# Patient Record
Sex: Male | Born: 1950 | Race: White | Hispanic: No | Marital: Married | Smoking: Never smoker
Health system: Southern US, Community
[De-identification: ages and names within clinical notes are randomized; demographics above are authoritative.]

## PROBLEM LIST (undated history)

## (undated) DIAGNOSIS — T4145XA Adverse effect of unspecified anesthetic, initial encounter: Secondary | ICD-10-CM

## (undated) DIAGNOSIS — H9190 Unspecified hearing loss, unspecified ear: Secondary | ICD-10-CM

## (undated) DIAGNOSIS — D126 Benign neoplasm of colon, unspecified: Secondary | ICD-10-CM

## (undated) DIAGNOSIS — M1990 Unspecified osteoarthritis, unspecified site: Secondary | ICD-10-CM

## (undated) DIAGNOSIS — G709 Myoneural disorder, unspecified: Secondary | ICD-10-CM

## (undated) DIAGNOSIS — I499 Cardiac arrhythmia, unspecified: Secondary | ICD-10-CM

## (undated) DIAGNOSIS — G2581 Restless legs syndrome: Secondary | ICD-10-CM

## (undated) DIAGNOSIS — N390 Urinary tract infection, site not specified: Secondary | ICD-10-CM

## (undated) DIAGNOSIS — E119 Type 2 diabetes mellitus without complications: Secondary | ICD-10-CM

## (undated) DIAGNOSIS — H269 Unspecified cataract: Secondary | ICD-10-CM

## (undated) DIAGNOSIS — G20A1 Parkinson's disease without dyskinesia, without mention of fluctuations: Secondary | ICD-10-CM

## (undated) DIAGNOSIS — G4733 Obstructive sleep apnea (adult) (pediatric): Secondary | ICD-10-CM

## (undated) DIAGNOSIS — E039 Hypothyroidism, unspecified: Secondary | ICD-10-CM

## (undated) DIAGNOSIS — K5900 Constipation, unspecified: Secondary | ICD-10-CM

## (undated) DIAGNOSIS — Z9889 Other specified postprocedural states: Secondary | ICD-10-CM

## (undated) DIAGNOSIS — N400 Enlarged prostate without lower urinary tract symptoms: Secondary | ICD-10-CM

## (undated) DIAGNOSIS — G4730 Sleep apnea, unspecified: Secondary | ICD-10-CM

## (undated) DIAGNOSIS — H409 Unspecified glaucoma: Secondary | ICD-10-CM

## (undated) DIAGNOSIS — I2510 Atherosclerotic heart disease of native coronary artery without angina pectoris: Secondary | ICD-10-CM

## (undated) DIAGNOSIS — I219 Acute myocardial infarction, unspecified: Secondary | ICD-10-CM

## (undated) DIAGNOSIS — N200 Calculus of kidney: Secondary | ICD-10-CM

## (undated) DIAGNOSIS — Z9989 Dependence on other enabling machines and devices: Secondary | ICD-10-CM

## (undated) DIAGNOSIS — T8859XA Other complications of anesthesia, initial encounter: Secondary | ICD-10-CM

## (undated) DIAGNOSIS — K439 Ventral hernia without obstruction or gangrene: Secondary | ICD-10-CM

## (undated) DIAGNOSIS — G20 Parkinson's disease: Secondary | ICD-10-CM

## (undated) DIAGNOSIS — I10 Essential (primary) hypertension: Secondary | ICD-10-CM

## (undated) DIAGNOSIS — Z87442 Personal history of urinary calculi: Secondary | ICD-10-CM

## (undated) DIAGNOSIS — E785 Hyperlipidemia, unspecified: Secondary | ICD-10-CM

## (undated) DIAGNOSIS — J45909 Unspecified asthma, uncomplicated: Secondary | ICD-10-CM

## (undated) DIAGNOSIS — E0590 Thyrotoxicosis, unspecified without thyrotoxic crisis or storm: Secondary | ICD-10-CM

## (undated) DIAGNOSIS — R112 Nausea with vomiting, unspecified: Secondary | ICD-10-CM

## (undated) DIAGNOSIS — Z95810 Presence of automatic (implantable) cardiac defibrillator: Secondary | ICD-10-CM

## (undated) DIAGNOSIS — R0602 Shortness of breath: Secondary | ICD-10-CM

## (undated) DIAGNOSIS — K219 Gastro-esophageal reflux disease without esophagitis: Secondary | ICD-10-CM

## (undated) HISTORY — PX: EYE SURGERY: SHX253

## (undated) HISTORY — DX: Essential (primary) hypertension: I10

## (undated) HISTORY — DX: Gastro-esophageal reflux disease without esophagitis: K21.9

## (undated) HISTORY — DX: Parkinson's disease without dyskinesia, without mention of fluctuations: G20.A1

## (undated) HISTORY — DX: Unspecified cataract: H26.9

## (undated) HISTORY — DX: Ventral hernia without obstruction or gangrene: K43.9

## (undated) HISTORY — DX: Benign neoplasm of colon, unspecified: D12.6

## (undated) HISTORY — DX: Parkinson's disease: G20

## (undated) HISTORY — DX: Benign prostatic hyperplasia without lower urinary tract symptoms: N40.0

## (undated) HISTORY — DX: Calculus of kidney: N20.0

## (undated) HISTORY — DX: Unspecified asthma, uncomplicated: J45.909

## (undated) HISTORY — PX: INSERT / REPLACE / REMOVE PACEMAKER: SUR710

## (undated) HISTORY — DX: Sleep apnea, unspecified: G47.30

## (undated) HISTORY — DX: Thyrotoxicosis, unspecified without thyrotoxic crisis or storm: E05.90

## (undated) HISTORY — PX: COLONOSCOPY W/ BIOPSIES AND POLYPECTOMY: SHX1376

## (undated) HISTORY — PX: CATARACT EXTRACTION W/ INTRAOCULAR LENS IMPLANT: SHX1309

## (undated) HISTORY — PX: CARDIAC CATHETERIZATION: SHX172

## (undated) HISTORY — DX: Obstructive sleep apnea (adult) (pediatric): G47.33

## (undated) HISTORY — PX: LITHOTRIPSY: SUR834

## (undated) HISTORY — DX: Atherosclerotic heart disease of native coronary artery without angina pectoris: I25.10

## (undated) HISTORY — DX: Urinary tract infection, site not specified: N39.0

## (undated) HISTORY — DX: Hyperlipidemia, unspecified: E78.5

## (undated) HISTORY — DX: Type 2 diabetes mellitus without complications: E11.9

## (undated) HISTORY — PX: FINGER AMPUTATION: SHX636

## (undated) HISTORY — PX: TONSILLECTOMY: SUR1361

## (undated) HISTORY — DX: Acute myocardial infarction, unspecified: I21.9

---

## 1979-01-10 HISTORY — PX: ACOUSTIC NEUROMA RESECTION: SHX5713

## 1996-01-10 HISTORY — PX: CORONARY STENT PLACEMENT: SHX1402

## 1997-12-03 ENCOUNTER — Other Ambulatory Visit: Admission: RE | Admit: 1997-12-03 | Discharge: 1997-12-03 | Payer: Self-pay | Admitting: Gastroenterology

## 1998-01-09 HISTORY — PX: MEDIAN STERNOTOMY: SUR860

## 1998-01-09 HISTORY — PX: CORONARY ARTERY BYPASS GRAFT: SHX141

## 1998-05-17 ENCOUNTER — Inpatient Hospital Stay (HOSPITAL_COMMUNITY): Admission: EM | Admit: 1998-05-17 | Discharge: 1998-05-24 | Payer: Self-pay | Admitting: Emergency Medicine

## 1998-05-17 ENCOUNTER — Encounter: Payer: Self-pay | Admitting: Emergency Medicine

## 1998-05-22 ENCOUNTER — Encounter: Payer: Self-pay | Admitting: Thoracic Surgery (Cardiothoracic Vascular Surgery)

## 1998-06-22 ENCOUNTER — Encounter (HOSPITAL_COMMUNITY): Admission: RE | Admit: 1998-06-22 | Discharge: 1998-09-20 | Payer: Self-pay | Admitting: Cardiology

## 2000-02-09 ENCOUNTER — Encounter: Payer: Self-pay | Admitting: Internal Medicine

## 2000-02-09 ENCOUNTER — Ambulatory Visit (HOSPITAL_COMMUNITY): Admission: RE | Admit: 2000-02-09 | Discharge: 2000-02-09 | Payer: Self-pay | Admitting: Internal Medicine

## 2000-07-25 ENCOUNTER — Ambulatory Visit (HOSPITAL_BASED_OUTPATIENT_CLINIC_OR_DEPARTMENT_OTHER): Admission: RE | Admit: 2000-07-25 | Discharge: 2000-07-25 | Payer: Self-pay | Admitting: Internal Medicine

## 2000-10-10 ENCOUNTER — Encounter: Admission: RE | Admit: 2000-10-10 | Discharge: 2001-01-08 | Payer: Self-pay | Admitting: Family Medicine

## 2002-01-09 HISTORY — PX: DEEP BRAIN STIMULATOR PLACEMENT: SHX608

## 2002-02-20 ENCOUNTER — Encounter: Payer: Self-pay | Admitting: Internal Medicine

## 2002-02-20 ENCOUNTER — Ambulatory Visit (HOSPITAL_COMMUNITY): Admission: RE | Admit: 2002-02-20 | Discharge: 2002-02-20 | Payer: Self-pay | Admitting: Internal Medicine

## 2002-05-15 ENCOUNTER — Encounter: Payer: Self-pay | Admitting: Internal Medicine

## 2002-05-15 ENCOUNTER — Encounter (HOSPITAL_COMMUNITY): Admission: RE | Admit: 2002-05-15 | Discharge: 2002-08-13 | Payer: Self-pay | Admitting: Internal Medicine

## 2003-02-26 ENCOUNTER — Ambulatory Visit (HOSPITAL_COMMUNITY): Admission: RE | Admit: 2003-02-26 | Discharge: 2003-02-26 | Payer: Self-pay | Admitting: Neurosurgery

## 2003-04-30 ENCOUNTER — Observation Stay (HOSPITAL_COMMUNITY): Admission: RE | Admit: 2003-04-30 | Discharge: 2003-04-30 | Payer: Self-pay | Admitting: Neurosurgery

## 2003-06-30 ENCOUNTER — Inpatient Hospital Stay (HOSPITAL_COMMUNITY): Admission: RE | Admit: 2003-06-30 | Discharge: 2003-07-01 | Payer: Self-pay | Admitting: Neurosurgery

## 2003-07-28 ENCOUNTER — Inpatient Hospital Stay (HOSPITAL_COMMUNITY): Admission: RE | Admit: 2003-07-28 | Discharge: 2003-07-29 | Payer: Self-pay | Admitting: Neurosurgery

## 2003-08-06 ENCOUNTER — Encounter: Admission: RE | Admit: 2003-08-06 | Discharge: 2003-08-06 | Payer: Self-pay | Admitting: Internal Medicine

## 2003-08-10 ENCOUNTER — Ambulatory Visit: Payer: Self-pay | Admitting: Cardiology

## 2003-11-12 ENCOUNTER — Ambulatory Visit: Payer: Self-pay

## 2003-11-16 ENCOUNTER — Ambulatory Visit: Payer: Self-pay | Admitting: Cardiology

## 2003-11-30 ENCOUNTER — Ambulatory Visit: Payer: Self-pay | Admitting: Cardiology

## 2003-12-14 ENCOUNTER — Ambulatory Visit: Payer: Self-pay | Admitting: Cardiology

## 2003-12-17 ENCOUNTER — Ambulatory Visit: Payer: Self-pay | Admitting: Cardiology

## 2003-12-24 ENCOUNTER — Ambulatory Visit: Payer: Self-pay | Admitting: Family Medicine

## 2004-01-07 ENCOUNTER — Encounter: Admission: RE | Admit: 2004-01-07 | Discharge: 2004-01-07 | Payer: Self-pay | Admitting: Internal Medicine

## 2004-01-07 ENCOUNTER — Ambulatory Visit: Payer: Self-pay | Admitting: Internal Medicine

## 2004-01-22 ENCOUNTER — Ambulatory Visit: Payer: Self-pay | Admitting: Internal Medicine

## 2004-02-10 ENCOUNTER — Ambulatory Visit: Payer: Self-pay | Admitting: Endocrinology

## 2004-02-11 ENCOUNTER — Encounter (HOSPITAL_COMMUNITY): Admission: RE | Admit: 2004-02-11 | Discharge: 2004-05-11 | Payer: Self-pay | Admitting: Endocrinology

## 2004-03-09 ENCOUNTER — Ambulatory Visit: Payer: Self-pay | Admitting: Cardiology

## 2004-03-10 ENCOUNTER — Ambulatory Visit: Payer: Self-pay | Admitting: *Deleted

## 2004-03-24 ENCOUNTER — Ambulatory Visit: Payer: Self-pay | Admitting: Endocrinology

## 2004-04-28 ENCOUNTER — Ambulatory Visit: Payer: Self-pay | Admitting: Internal Medicine

## 2004-05-10 ENCOUNTER — Ambulatory Visit: Payer: Self-pay | Admitting: Internal Medicine

## 2004-05-16 ENCOUNTER — Ambulatory Visit: Payer: Self-pay | Admitting: Endocrinology

## 2004-06-23 ENCOUNTER — Ambulatory Visit: Payer: Self-pay | Admitting: Internal Medicine

## 2004-06-23 ENCOUNTER — Ambulatory Visit: Payer: Self-pay | Admitting: Endocrinology

## 2004-07-06 ENCOUNTER — Ambulatory Visit: Payer: Self-pay | Admitting: Internal Medicine

## 2004-07-22 ENCOUNTER — Encounter: Admission: RE | Admit: 2004-07-22 | Discharge: 2004-07-22 | Payer: Self-pay | Admitting: Internal Medicine

## 2004-08-12 ENCOUNTER — Ambulatory Visit: Payer: Self-pay | Admitting: Cardiovascular Disease

## 2004-09-14 ENCOUNTER — Ambulatory Visit: Payer: Self-pay | Admitting: Cardiology

## 2004-09-15 ENCOUNTER — Ambulatory Visit: Payer: Self-pay | Admitting: Internal Medicine

## 2004-11-17 ENCOUNTER — Ambulatory Visit: Payer: Self-pay | Admitting: Internal Medicine

## 2004-12-07 ENCOUNTER — Ambulatory Visit: Payer: Self-pay

## 2004-12-08 ENCOUNTER — Ambulatory Visit: Payer: Self-pay | Admitting: Cardiology

## 2005-02-08 ENCOUNTER — Ambulatory Visit: Payer: Self-pay | Admitting: Cardiology

## 2005-02-10 ENCOUNTER — Ambulatory Visit: Payer: Self-pay | Admitting: Internal Medicine

## 2005-02-17 ENCOUNTER — Ambulatory Visit: Payer: Self-pay | Admitting: Internal Medicine

## 2005-05-31 ENCOUNTER — Emergency Department (HOSPITAL_COMMUNITY): Admission: EM | Admit: 2005-05-31 | Discharge: 2005-05-31 | Payer: Self-pay | Admitting: Family Medicine

## 2005-07-07 ENCOUNTER — Ambulatory Visit: Payer: Self-pay | Admitting: Internal Medicine

## 2005-09-10 ENCOUNTER — Emergency Department (HOSPITAL_COMMUNITY): Admission: EM | Admit: 2005-09-10 | Discharge: 2005-09-10 | Payer: Self-pay | Admitting: Emergency Medicine

## 2005-10-06 ENCOUNTER — Ambulatory Visit: Payer: Self-pay | Admitting: Internal Medicine

## 2005-10-17 ENCOUNTER — Ambulatory Visit: Payer: Self-pay | Admitting: Internal Medicine

## 2005-11-01 ENCOUNTER — Ambulatory Visit: Payer: Self-pay | Admitting: Cardiology

## 2005-11-13 ENCOUNTER — Ambulatory Visit: Payer: Self-pay | Admitting: Internal Medicine

## 2005-12-27 ENCOUNTER — Encounter: Admission: RE | Admit: 2005-12-27 | Discharge: 2006-01-26 | Payer: Self-pay | Admitting: Internal Medicine

## 2006-02-15 ENCOUNTER — Encounter: Admission: RE | Admit: 2006-02-15 | Discharge: 2006-02-15 | Payer: Self-pay | Admitting: Neurology

## 2006-03-01 ENCOUNTER — Encounter: Admission: RE | Admit: 2006-03-01 | Discharge: 2006-03-01 | Payer: Self-pay | Admitting: Neurology

## 2006-03-22 ENCOUNTER — Encounter: Admission: RE | Admit: 2006-03-22 | Discharge: 2006-03-22 | Payer: Self-pay | Admitting: Neurology

## 2006-04-19 ENCOUNTER — Ambulatory Visit: Payer: Self-pay | Admitting: Internal Medicine

## 2006-04-19 LAB — CONVERTED CEMR LAB
BUN: 15 mg/dL (ref 6–23)
HDL: 38.9 mg/dL — ABNORMAL LOW (ref >39.0)
Hgb A1c MFr Bld: 8 % — ABNORMAL HIGH (ref 4.6–6.0)

## 2006-05-01 ENCOUNTER — Ambulatory Visit: Payer: Self-pay | Admitting: Internal Medicine

## 2006-05-04 ENCOUNTER — Ambulatory Visit (HOSPITAL_COMMUNITY): Admission: RE | Admit: 2006-05-04 | Discharge: 2006-05-04 | Payer: Self-pay | Admitting: Ophthalmology

## 2006-05-18 ENCOUNTER — Encounter: Payer: Self-pay | Admitting: Internal Medicine

## 2006-05-18 ENCOUNTER — Ambulatory Visit: Payer: Self-pay | Admitting: Internal Medicine

## 2006-06-20 ENCOUNTER — Emergency Department (HOSPITAL_COMMUNITY): Admission: EM | Admit: 2006-06-20 | Discharge: 2006-06-20 | Payer: Self-pay | Admitting: Emergency Medicine

## 2006-07-11 ENCOUNTER — Ambulatory Visit: Payer: Self-pay | Admitting: Cardiology

## 2006-07-11 LAB — CONVERTED CEMR LAB
Albumin: 3.8 g/dL (ref 3.5–5.2)
BUN: 14 mg/dL (ref 6–23)
Bilirubin, Direct: 0.1 mg/dL (ref 0.0–0.3)
CO2: 34 meq/L — ABNORMAL HIGH (ref 19–32)
Calcium: 9.2 mg/dL (ref 8.4–10.5)
Potassium: 3.2 meq/L — ABNORMAL LOW (ref 3.5–5.1)
Sodium: 140 meq/L (ref 135–145)
Total Protein: 6.4 g/dL (ref 6.0–8.3)

## 2006-07-24 ENCOUNTER — Ambulatory Visit: Payer: Self-pay | Admitting: Cardiology

## 2006-07-24 LAB — CONVERTED CEMR LAB
BUN: 11 mg/dL (ref 6–23)
Calcium: 9.5 mg/dL (ref 8.4–10.5)
Chloride: 102 meq/L (ref 96–112)
Creatinine, Ser: 1.2 mg/dL (ref 0.4–1.5)
GFR calc non Af Amer: 67 mL/min
Glucose, Bld: 134 mg/dL — ABNORMAL HIGH (ref 70–99)

## 2006-08-23 ENCOUNTER — Ambulatory Visit: Payer: Self-pay | Admitting: Internal Medicine

## 2006-08-31 ENCOUNTER — Encounter (INDEPENDENT_AMBULATORY_CARE_PROVIDER_SITE_OTHER): Payer: Self-pay | Admitting: *Deleted

## 2006-08-31 ENCOUNTER — Ambulatory Visit: Payer: Self-pay | Admitting: Internal Medicine

## 2006-08-31 DIAGNOSIS — E785 Hyperlipidemia, unspecified: Secondary | ICD-10-CM | POA: Insufficient documentation

## 2006-08-31 DIAGNOSIS — Z951 Presence of aortocoronary bypass graft: Secondary | ICD-10-CM | POA: Insufficient documentation

## 2006-08-31 DIAGNOSIS — G20 Parkinson's disease: Secondary | ICD-10-CM | POA: Insufficient documentation

## 2006-08-31 DIAGNOSIS — I252 Old myocardial infarction: Secondary | ICD-10-CM | POA: Insufficient documentation

## 2006-08-31 DIAGNOSIS — I10 Essential (primary) hypertension: Secondary | ICD-10-CM | POA: Insufficient documentation

## 2006-08-31 LAB — CONVERTED CEMR LAB
Creatinine,U: 29.2 mg/dL
Hgb A1c MFr Bld: 6.8 % — ABNORMAL HIGH (ref 4.6–6.0)
Microalb Creat Ratio: 6.8 mg/g (ref 0.0–30.0)

## 2006-11-19 ENCOUNTER — Encounter: Payer: Self-pay | Admitting: Internal Medicine

## 2006-12-28 ENCOUNTER — Ambulatory Visit: Payer: Self-pay | Admitting: Internal Medicine

## 2006-12-28 DIAGNOSIS — K439 Ventral hernia without obstruction or gangrene: Secondary | ICD-10-CM | POA: Insufficient documentation

## 2007-04-08 ENCOUNTER — Encounter: Payer: Self-pay | Admitting: Internal Medicine

## 2007-05-03 ENCOUNTER — Ambulatory Visit: Payer: Self-pay | Admitting: Cardiology

## 2007-05-10 ENCOUNTER — Encounter: Payer: Self-pay | Admitting: Cardiology

## 2007-05-10 ENCOUNTER — Encounter: Payer: Self-pay | Admitting: Internal Medicine

## 2007-05-10 ENCOUNTER — Ambulatory Visit: Payer: Self-pay

## 2007-05-10 LAB — CONVERTED CEMR LAB
ALT: 51 units/L (ref 0–53)
AST: 22 units/L (ref 0–37)
Albumin: 3.6 g/dL (ref 3.5–5.2)
CO2: 33 meq/L — ABNORMAL HIGH (ref 19–32)
Calcium: 8.6 mg/dL (ref 8.4–10.5)
Creatinine, Ser: 1.1 mg/dL (ref 0.4–1.5)
HDL: 28.3 mg/dL — ABNORMAL LOW (ref >39.0)
LDL Cholesterol: 72 mg/dL (ref 0–99)
Potassium: 4.3 meq/L (ref 3.5–5.1)
Sodium: 142 meq/L (ref 135–145)
Total Bilirubin: 0.6 mg/dL (ref 0.3–1.2)
VLDL: 18 mg/dL (ref 0–40)

## 2007-05-24 ENCOUNTER — Encounter: Admission: RE | Admit: 2007-05-24 | Discharge: 2007-05-24 | Payer: Self-pay | Admitting: Neurology

## 2007-05-31 ENCOUNTER — Encounter: Admission: RE | Admit: 2007-05-31 | Discharge: 2007-05-31 | Payer: Self-pay | Admitting: Neurology

## 2007-09-02 ENCOUNTER — Ambulatory Visit: Payer: Self-pay | Admitting: Internal Medicine

## 2007-09-02 DIAGNOSIS — N400 Enlarged prostate without lower urinary tract symptoms: Secondary | ICD-10-CM | POA: Insufficient documentation

## 2007-09-02 DIAGNOSIS — Z87442 Personal history of urinary calculi: Secondary | ICD-10-CM | POA: Insufficient documentation

## 2007-09-02 DIAGNOSIS — D126 Benign neoplasm of colon, unspecified: Secondary | ICD-10-CM | POA: Insufficient documentation

## 2007-09-02 LAB — CONVERTED CEMR LAB
Cholesterol, target level: 200 mg/dL
HDL goal, serum: 40 mg/dL

## 2007-09-03 ENCOUNTER — Encounter (INDEPENDENT_AMBULATORY_CARE_PROVIDER_SITE_OTHER): Payer: Self-pay | Admitting: *Deleted

## 2007-09-03 LAB — CONVERTED CEMR LAB
Creatinine,U: 29.8 mg/dL
Microalb Creat Ratio: 6.7 mg/g (ref 0.0–30.0)
Microalb, Ur: 0.2 mg/dL (ref 0.0–1.9)

## 2007-09-10 ENCOUNTER — Ambulatory Visit: Payer: Self-pay | Admitting: Internal Medicine

## 2007-09-10 ENCOUNTER — Encounter (INDEPENDENT_AMBULATORY_CARE_PROVIDER_SITE_OTHER): Payer: Self-pay | Admitting: *Deleted

## 2007-09-10 LAB — CONVERTED CEMR LAB: OCCULT 2: NEGATIVE

## 2007-10-11 ENCOUNTER — Encounter: Payer: Self-pay | Admitting: Internal Medicine

## 2007-10-17 ENCOUNTER — Ambulatory Visit: Payer: Self-pay | Admitting: Internal Medicine

## 2007-11-25 ENCOUNTER — Encounter: Payer: Self-pay | Admitting: Internal Medicine

## 2008-01-22 ENCOUNTER — Ambulatory Visit: Payer: Self-pay | Admitting: Family Medicine

## 2008-01-22 DIAGNOSIS — K5289 Other specified noninfective gastroenteritis and colitis: Secondary | ICD-10-CM | POA: Insufficient documentation

## 2008-03-04 ENCOUNTER — Ambulatory Visit: Payer: Self-pay | Admitting: Internal Medicine

## 2008-03-10 ENCOUNTER — Encounter: Admission: RE | Admit: 2008-03-10 | Discharge: 2008-03-10 | Payer: Self-pay | Admitting: Neurology

## 2008-03-19 ENCOUNTER — Ambulatory Visit: Payer: Self-pay | Admitting: Internal Medicine

## 2008-03-21 ENCOUNTER — Encounter: Payer: Self-pay | Admitting: Internal Medicine

## 2008-04-15 ENCOUNTER — Encounter: Payer: Self-pay | Admitting: Endocrinology

## 2008-04-24 ENCOUNTER — Encounter: Payer: Self-pay | Admitting: Cardiology

## 2008-04-24 ENCOUNTER — Ambulatory Visit: Payer: Self-pay | Admitting: Cardiology

## 2008-04-24 DIAGNOSIS — I2589 Other forms of chronic ischemic heart disease: Secondary | ICD-10-CM | POA: Insufficient documentation

## 2008-04-27 ENCOUNTER — Ambulatory Visit: Payer: Self-pay | Admitting: Cardiology

## 2008-04-27 LAB — CONVERTED CEMR LAB
AST: 17 units/L (ref 0–37)
Cholesterol: 101 mg/dL (ref 0–200)
Creatinine, Ser: 1.1 mg/dL (ref 0.4–1.5)
Glucose, Bld: 110 mg/dL — ABNORMAL HIGH (ref 70–99)
Sodium: 144 meq/L (ref 135–145)
Total Bilirubin: 0.7 mg/dL (ref 0.3–1.2)
Triglycerides: 84 mg/dL (ref 0.0–149.0)
VLDL: 16.8 mg/dL (ref 0.0–40.0)

## 2008-04-29 ENCOUNTER — Encounter (INDEPENDENT_AMBULATORY_CARE_PROVIDER_SITE_OTHER): Payer: Self-pay | Admitting: *Deleted

## 2008-05-19 ENCOUNTER — Ambulatory Visit: Payer: Self-pay | Admitting: Internal Medicine

## 2008-05-20 ENCOUNTER — Encounter (INDEPENDENT_AMBULATORY_CARE_PROVIDER_SITE_OTHER): Payer: Self-pay | Admitting: *Deleted

## 2008-05-22 ENCOUNTER — Ambulatory Visit: Payer: Self-pay | Admitting: Internal Medicine

## 2008-06-22 ENCOUNTER — Encounter: Payer: Self-pay | Admitting: Internal Medicine

## 2008-06-26 ENCOUNTER — Ambulatory Visit (HOSPITAL_COMMUNITY): Admission: RE | Admit: 2008-06-26 | Discharge: 2008-06-26 | Payer: Self-pay | Admitting: Neurosurgery

## 2008-07-17 ENCOUNTER — Telehealth (INDEPENDENT_AMBULATORY_CARE_PROVIDER_SITE_OTHER): Payer: Self-pay | Admitting: *Deleted

## 2008-08-18 ENCOUNTER — Ambulatory Visit: Payer: Self-pay | Admitting: Internal Medicine

## 2008-08-18 DIAGNOSIS — M549 Dorsalgia, unspecified: Secondary | ICD-10-CM | POA: Insufficient documentation

## 2008-08-18 LAB — CONVERTED CEMR LAB
Blood in Urine, dipstick: NEGATIVE
Glucose, Urine, Semiquant: NEGATIVE
Ketones, urine, test strip: NEGATIVE
Nitrite: NEGATIVE
Protein, U semiquant: NEGATIVE
Specific Gravity, Urine: <1.005
Urobilinogen, UA: 0.2
WBC Urine, dipstick: NEGATIVE

## 2008-08-19 ENCOUNTER — Encounter (INDEPENDENT_AMBULATORY_CARE_PROVIDER_SITE_OTHER): Payer: Self-pay | Admitting: *Deleted

## 2008-08-19 LAB — CONVERTED CEMR LAB: Hgb A1c MFr Bld: 6.5 % (ref 4.6–6.5)

## 2008-11-16 ENCOUNTER — Ambulatory Visit: Payer: Self-pay | Admitting: Internal Medicine

## 2008-11-17 ENCOUNTER — Encounter (INDEPENDENT_AMBULATORY_CARE_PROVIDER_SITE_OTHER): Payer: Self-pay | Admitting: *Deleted

## 2008-11-26 ENCOUNTER — Encounter: Payer: Self-pay | Admitting: Internal Medicine

## 2008-12-01 ENCOUNTER — Telehealth (INDEPENDENT_AMBULATORY_CARE_PROVIDER_SITE_OTHER): Payer: Self-pay | Admitting: *Deleted

## 2008-12-02 ENCOUNTER — Encounter: Payer: Self-pay | Admitting: Internal Medicine

## 2008-12-06 ENCOUNTER — Encounter: Payer: Self-pay | Admitting: Internal Medicine

## 2008-12-17 ENCOUNTER — Ambulatory Visit: Payer: Self-pay | Admitting: Internal Medicine

## 2008-12-21 LAB — CONVERTED CEMR LAB: Hgb A1c MFr Bld: 6.6 % — ABNORMAL HIGH (ref 4.6–6.5)

## 2008-12-28 ENCOUNTER — Encounter: Payer: Self-pay | Admitting: Internal Medicine

## 2009-02-17 ENCOUNTER — Telehealth (INDEPENDENT_AMBULATORY_CARE_PROVIDER_SITE_OTHER): Payer: Self-pay | Admitting: *Deleted

## 2009-03-03 ENCOUNTER — Telehealth (INDEPENDENT_AMBULATORY_CARE_PROVIDER_SITE_OTHER): Payer: Self-pay | Admitting: *Deleted

## 2009-03-05 ENCOUNTER — Encounter: Payer: Self-pay | Admitting: Internal Medicine

## 2009-03-12 ENCOUNTER — Encounter: Payer: Self-pay | Admitting: Family Medicine

## 2009-03-19 ENCOUNTER — Encounter: Payer: Self-pay | Admitting: Family Medicine

## 2009-04-01 ENCOUNTER — Encounter: Payer: Self-pay | Admitting: Internal Medicine

## 2009-04-12 ENCOUNTER — Telehealth: Payer: Self-pay | Admitting: Cardiology

## 2009-04-12 ENCOUNTER — Telehealth (INDEPENDENT_AMBULATORY_CARE_PROVIDER_SITE_OTHER): Payer: Self-pay | Admitting: *Deleted

## 2009-04-14 ENCOUNTER — Ambulatory Visit: Payer: Self-pay | Admitting: Internal Medicine

## 2009-04-20 LAB — CONVERTED CEMR LAB
Creatinine,U: 12.4 mg/dL
Microalb Creat Ratio: 16.1 mg/g (ref 0.0–30.0)

## 2009-04-26 ENCOUNTER — Ambulatory Visit: Payer: Self-pay | Admitting: Internal Medicine

## 2009-04-26 DIAGNOSIS — Z794 Long term (current) use of insulin: Secondary | ICD-10-CM

## 2009-04-26 DIAGNOSIS — IMO0001 Reserved for inherently not codable concepts without codable children: Secondary | ICD-10-CM | POA: Insufficient documentation

## 2009-04-26 DIAGNOSIS — E119 Type 2 diabetes mellitus without complications: Secondary | ICD-10-CM

## 2009-04-26 DIAGNOSIS — E1122 Type 2 diabetes mellitus with diabetic chronic kidney disease: Secondary | ICD-10-CM | POA: Insufficient documentation

## 2009-05-11 DIAGNOSIS — E876 Hypokalemia: Secondary | ICD-10-CM | POA: Insufficient documentation

## 2009-05-11 DIAGNOSIS — E0590 Thyrotoxicosis, unspecified without thyrotoxic crisis or storm: Secondary | ICD-10-CM | POA: Insufficient documentation

## 2009-05-11 DIAGNOSIS — G4730 Sleep apnea, unspecified: Secondary | ICD-10-CM | POA: Insufficient documentation

## 2009-05-11 DIAGNOSIS — I428 Other cardiomyopathies: Secondary | ICD-10-CM | POA: Insufficient documentation

## 2009-05-11 DIAGNOSIS — K219 Gastro-esophageal reflux disease without esophagitis: Secondary | ICD-10-CM | POA: Insufficient documentation

## 2009-05-13 ENCOUNTER — Encounter (INDEPENDENT_AMBULATORY_CARE_PROVIDER_SITE_OTHER): Payer: Self-pay | Admitting: *Deleted

## 2009-05-13 ENCOUNTER — Ambulatory Visit: Payer: Self-pay | Admitting: Cardiology

## 2009-05-26 ENCOUNTER — Encounter: Payer: Self-pay | Admitting: Internal Medicine

## 2009-07-07 ENCOUNTER — Telehealth (INDEPENDENT_AMBULATORY_CARE_PROVIDER_SITE_OTHER): Payer: Self-pay | Admitting: *Deleted

## 2009-07-20 ENCOUNTER — Encounter: Payer: Self-pay | Admitting: Internal Medicine

## 2009-08-10 ENCOUNTER — Ambulatory Visit: Payer: Self-pay | Admitting: Internal Medicine

## 2009-08-16 LAB — CONVERTED CEMR LAB
ALT: 21 units/L (ref 0–53)
BUN: 19 mg/dL (ref 6–23)
Bilirubin, Direct: 0.2 mg/dL (ref 0.0–0.3)
Cholesterol: 123 mg/dL (ref 0–200)
Creatinine, Ser: 0.9 mg/dL (ref 0.4–1.5)
HDL: 30.7 mg/dL — ABNORMAL LOW (ref >39.00)
Hgb A1c MFr Bld: 8 % — ABNORMAL HIGH (ref 4.6–6.5)
LDL Cholesterol: 61 mg/dL (ref 0–99)
Potassium: 4.1 meq/L (ref 3.5–5.1)
Total Bilirubin: 0.7 mg/dL (ref 0.3–1.2)
Total CHOL/HDL Ratio: 4
Triglycerides: 158 mg/dL — ABNORMAL HIGH (ref 0.0–149.0)
VLDL: 31.6 mg/dL (ref 0.0–40.0)

## 2009-08-31 ENCOUNTER — Ambulatory Visit: Payer: Self-pay | Admitting: Internal Medicine

## 2009-08-31 DIAGNOSIS — IMO0001 Reserved for inherently not codable concepts without codable children: Secondary | ICD-10-CM | POA: Insufficient documentation

## 2009-11-01 ENCOUNTER — Ambulatory Visit: Payer: Self-pay | Admitting: Internal Medicine

## 2009-11-05 LAB — CONVERTED CEMR LAB
ALT: 15 units/L (ref 0–53)
AST: 12 units/L (ref 0–37)
Albumin: 3.5 g/dL (ref 3.5–5.2)
Alkaline Phosphatase: 77 units/L (ref 39–117)
Basophils Absolute: 0 10*3/uL (ref 0.0–0.1)
Cholesterol: 120 mg/dL (ref 0–200)
Creatinine,U: 140.1 mg/dL
Eosinophils Relative: 2.7 % (ref 0.0–5.0)
Free T4: 0.79 ng/dL (ref 0.60–1.60)
HCT: 38.6 % — ABNORMAL LOW (ref 39.0–52.0)
Hgb A1c MFr Bld: 8.1 % — ABNORMAL HIGH (ref 4.6–6.5)
Lymphs Abs: 2.3 10*3/uL (ref 0.7–4.0)
MCV: 84.1 fL (ref 78.0–100.0)
Microalb, Ur: 0.6 mg/dL (ref 0.0–1.9)
Monocytes Absolute: 0.8 10*3/uL (ref 0.1–1.0)
Monocytes Relative: 10.8 % (ref 3.0–12.0)
Neutrophils Relative %: 55.4 % (ref 43.0–77.0)
Platelets: 126 10*3/uL — ABNORMAL LOW (ref 150.0–400.0)
RDW: 14.8 % — ABNORMAL HIGH (ref 11.5–14.6)
TSH: 2.99 microintl units/mL (ref 0.35–5.50)
Total CK: 88 units/L (ref 7–232)
VLDL: 37 mg/dL (ref 0.0–40.0)
WBC: 7.4 10*3/uL (ref 4.5–10.5)

## 2009-11-08 ENCOUNTER — Encounter: Payer: Self-pay | Admitting: Internal Medicine

## 2009-11-09 ENCOUNTER — Ambulatory Visit: Payer: Self-pay | Admitting: Internal Medicine

## 2009-11-09 DIAGNOSIS — R0609 Other forms of dyspnea: Secondary | ICD-10-CM | POA: Insufficient documentation

## 2009-11-09 DIAGNOSIS — R0989 Other specified symptoms and signs involving the circulatory and respiratory systems: Secondary | ICD-10-CM

## 2009-11-24 ENCOUNTER — Encounter: Payer: Self-pay | Admitting: Internal Medicine

## 2009-12-13 ENCOUNTER — Encounter: Payer: Self-pay | Admitting: Internal Medicine

## 2010-01-29 ENCOUNTER — Encounter: Payer: Self-pay | Admitting: Neurosurgery

## 2010-02-06 LAB — CONVERTED CEMR LAB
ALT: 31 units/L (ref 0–53)
Bilirubin, Direct: 0.1 mg/dL (ref 0.0–0.3)
CO2: 33 meq/L — ABNORMAL HIGH (ref 19–32)
Calcium: 9.3 mg/dL (ref 8.4–10.5)
Creatinine, Ser: 0.8 mg/dL (ref 0.4–1.5)
Glucose, Bld: 159 mg/dL — ABNORMAL HIGH (ref 70–99)
Potassium: 4 meq/L (ref 3.5–5.1)
Total Bilirubin: 0.6 mg/dL (ref 0.3–1.2)
Total CHOL/HDL Ratio: 3
VLDL: 17.6 mg/dL (ref 0.0–40.0)

## 2010-02-10 NOTE — Letter (Signed)
Summary: [facility]   Imported By: Lanelle Bal 11/23/2009 11:56:53  _____________________________________________________________________  External Attachment:    Type:   Image     Comment:   External Document

## 2010-02-10 NOTE — Assessment & Plan Note (Signed)
Summary: followup on labs, flu shot/kn   Vital Signs:  Patient profile:   60 year old male Weight:      277 pounds BMI:     41.05 Temp:     98.5 degrees F oral Pulse rate:   72 / minute Resp:     17 per minute BP sitting:   118 / 72  (left arm) Cuff size:   large  Vitals Entered By: Shonna Chock CMA (November 09, 2009 8:06 AM) CC: Follow-up visit: dicuss labs and meds, patient taking Janumet 1 po daily  instead of 1 po two times daily (as prescribed), Type 2 diabetes mellitus follow-up   Primary Care Suzane Vanderweide:  Alfonse Flavors  CC:  Follow-up visit: dicuss labs and meds, patient taking Janumet 1 po daily  instead of 1 po two times daily (as prescribed), and Type 2 diabetes mellitus follow-up.  History of Present Illness: Type 2 Diabetes Mellitus Follow-Up      This is a 60 year old man who presents for Type 2 diabetes mellitus follow-up.  The patient reports polyuria ( after diuretic), but denies polydipsia, blurred vision, self managed hypoglycemia, weight loss, weight gain, and numbness of extremities.  The patient denies the following symptoms: neuropathic pain, chest pain, vomiting, orthostatic symptoms, poor wound healing, vision loss, and foot ulcer.  Since the last visit the patient reports poor dietary compliance.  The patient has been measuring capillary blood glucose before breakfast, average 200.  Since the last visit, the patient reports having had eye care by an Ophthalmologist, no retinopathy.  Labs reviewed : TG 185( 888 in 05/2009); A1c 8.1 % ( average 186, risk 62%). He was only taking Janumet 50/1000 once daily  rather than two times a day. Hyperlipidemia Follow-Up      The patient also presents for Hyperlipidemia follow-up.  The patient reports constipation, but denies muscle aches, GI upset, abdominal pain, diarrhea, and fatigue.  Other symptoms include dypsnea (chronic sice CBAG).  The patient denies the following symptoms: palpitations, syncope, and pedal edema.  Compliance with  medications (by patient report) has been near 100%.  The patient reports exercising 3-4X per week.  Adjunctive measures currently used by the patient include ASA and folic acid.  LDL 49 on Crestor 20 mg once daily.  Current Medications (verified): 1)  Methimazole 10 Mg Tabs (Methimazole) .Marland Kitchen.. 1 By Mouth Once Daily, Additional Refills Require Appointment 2)  Amaryl 2 Mg  Tabs (Glimepiride) .Marland Kitchen.. 1 By Mouth Once Daily, Additional Refills Require Appointment 3)  Cozaar 50 Mg  Tabs (Losartan Potassium) .... Take One Tablet Daily 4)  Metoprolol Succinate 50 Mg Xr24h-Tab (Metoprolol Succinate) .... Take One Tablet By Mouth Daily 5)  Furosemide 80 Mg  Tabs (Furosemide) .... Take One Tablet in The Am and One Half Tablet in The Pm 6)  Travatan Z 0.004 % Soln (Travoprost) .Marland Kitchen.. 1 Drop in Right Eye Once Daily 7)  Aspirin 81 Mg Tbec (Aspirin) .... Take One Tablet By Mouth Daily 8)  Antioxident Formula .Marland Kitchen.. 1 Tab By Mouth Once Daily 9)  Trihexyphenidyl Hcl 2 Mg Tabs (Trihexyphenidyl Hcl) .Marland Kitchen.. 1 Tab By Mouth Three Times A Day 10)  Mirapex 0.5 Mg Tabs (Pramipexole Dihydrochloride) .Marland Kitchen.. 1 Tab By Mouth Three Times A Day 11)  Tramadol Hcl 50 Mg Tabs (Tramadol Hcl) .Marland Kitchen.. 1 By Mouth Every 6 Hours As Needed 12)  Klor-Con 10 10 Meq Cr-Tabs (Potassium Chloride) .... Take 3 Tablets By Mouth Once A Day 13)  Crestor 20 Mg  Tabs (Rosuvastatin Calcium) .Marland Kitchen.. 1  Each Am 14)  Janumet 50-1000 Mg Tabs (Sitagliptin-Metformin Hcl) .Marland Kitchen.. 1 By Mouth Once Daily  Allergies: 1)  ! Pcn  Review of Systems CV:  Denies difficulty breathing at night and difficulty breathing while lying down. Resp:  Denies coughing up blood, sputum productive, and wheezing.  Physical Exam  General:  in no acute distress; alert,appropriate and cooperative throughout examination; Parkinson's stigmata Lungs:  Normal respiratory effort, chest expands symmetrically. Lungs are clear to auscultation, no crackles or wheezes. Heart:  regular rhythm, no murmur, no  gallop, no rub, no JVD, and bradycardia.   Pulses:  R and L carotid,radial,dorsalis pedis and posterior tibial pulses are full and equal bilaterally Extremities:  No clubbing, cyanosis, edema .Good nail health Neurologic:  Tremor UE; mask facies. Light touch normal over feet Skin:  Intact without suspicious lesions or rashes Psych:  memory intact for recent and remote, normally interactive, and good eye contact.     Impression & Recommendations:  Problem # 1:  DIABETES MELLITUS, UNCONTROLLED (ICD-250.02)  His updated medication list for this problem includes:    Amaryl 2 Mg Tabs (Glimepiride) .Marland Kitchen... 1 by mouth once daily, additional refills require appointment    Cozaar 50 Mg Tabs (Losartan potassium) .Marland Kitchen... Take one tablet daily    Aspirin 81 Mg Tbec (Aspirin) .Marland Kitchen... Take one tablet by mouth daily    Janumet 50-1000 Mg Tabs (Sitagliptin-metformin hcl) .Marland Kitchen... 1 by mouth  two times a day with 2 largest meals  Problem # 2:  HYPERLIPIDEMIA NEC/NOS (ICD-272.4)  His updated medication list for this problem includes:    Crestor 20 Mg Tabs (Rosuvastatin calcium) .Marland Kitchen... 1 once daily except 1/2 tues & th  Problem # 3:  DYSPNEA/SHORTNESS OF BREATH (ICD-786.09)  His updated medication list for this problem includes:    Metoprolol Succinate 50 Mg Xr24h-tab (Metoprolol succinate) .Marland Kitchen... Take one tablet by mouth daily    Furosemide 80 Mg Tabs (Furosemide) .Marland Kitchen... Take one tablet in the am and one half tablet in the pm  Orders: T-2 View CXR (71020TC)  Problem # 4:  HYPERTENSION, ESSENTIAL NOS (ICD-401.9) controlled His updated medication list for this problem includes:    Cozaar 50 Mg Tabs (Losartan potassium) .Marland Kitchen... Take one tablet daily    Metoprolol Succinate 50 Mg Xr24h-tab (Metoprolol succinate) .Marland Kitchen... Take one tablet by mouth daily    Furosemide 80 Mg Tabs (Furosemide) .Marland Kitchen... Take one tablet in the am and one half tablet in the pm  Complete Medication List: 1)  Methimazole 10 Mg Tabs  (Methimazole) .Marland Kitchen.. 1 by mouth once daily, additional refills require appointment 2)  Amaryl 2 Mg Tabs (Glimepiride) .Marland Kitchen.. 1 by mouth once daily, additional refills require appointment 3)  Cozaar 50 Mg Tabs (Losartan potassium) .... Take one tablet daily 4)  Metoprolol Succinate 50 Mg Xr24h-tab (Metoprolol succinate) .... Take one tablet by mouth daily 5)  Furosemide 80 Mg Tabs (Furosemide) .... Take one tablet in the am and one half tablet in the pm 6)  Travatan Z 0.004 % Soln (Travoprost) .Marland Kitchen.. 1 drop in right eye once daily 7)  Aspirin 81 Mg Tbec (Aspirin) .... Take one tablet by mouth daily 8)  Antioxident Formula  .Marland Kitchen.. 1 tab by mouth once daily 9)  Trihexyphenidyl Hcl 2 Mg Tabs (Trihexyphenidyl hcl) .Marland Kitchen.. 1 tab by mouth three times a day 10)  Mirapex 0.5 Mg Tabs (Pramipexole dihydrochloride) .Marland Kitchen.. 1 tab by mouth three times a day 11)  Tramadol Hcl 50 Mg Tabs (  Tramadol hcl) .Marland Kitchen.. 1 by mouth every 6 hours as needed 12)  Klor-con 10 10 Meq Cr-tabs (Potassium chloride) .... Take 3 tablets by mouth once a day 13)  Crestor 20 Mg Tabs (Rosuvastatin calcium) .Marland Kitchen.. 1 once daily except 1/2 tues & th 14)  Janumet 50-1000 Mg Tabs (Sitagliptin-metformin hcl) .Marland Kitchen.. 1 by mouth  two times a day with 2 largest meals  Patient Instructions: 1)  Check your blood sugars regularly.Your fasting goal = 90-150. Consume < 40 grams of HFCS sugar/ day as discussed. 2)  Please schedule a follow-up appointment in 3 months. 3)  Lipid Panel prior to visit, ICD-9: 4)  HbgA1C prior to visit, ICD-9: Prescriptions: JANUMET 50-1000 MG TABS (SITAGLIPTIN-METFORMIN HCL) 1 by mouth  two times a day with 2 largest meals  #60 x 5   Entered and Authorized by:   Marga Melnick MD   Signed by:   Marga Melnick MD on 11/09/2009   Method used:   Print then Give to Patient   RxID:   [id number]    Orders Added: 1)  Est. Patient Level IV [14782] 2)  T-2 View CXR [71020TC]  Appended Document: followup on labs, flu shot/kn Flu  Vaccine Consent Questions     Do you have a history of severe allergic reactions to this vaccine? no    Any prior history of allergic reactions to egg and/or gelatin? no    Do you have a sensitivity to the preservative Thimersol? no    Do you have a past history of Guillan-Barre Syndrome? no    Do you currently have an acute febrile illness? no    Have you ever had a severe reaction to latex? no    Vaccine information given and explained to patient? yes    Are you currently pregnant? no    Lot Number:[id number]   Exp Date:07/09/2010   Site Given  Left Deltoid IM

## 2010-02-10 NOTE — Medication Information (Signed)
Summary: Nonadherence with Metformin & Losartan/[employer]  Nonadherence with Metformin & Losartan/[employer]   Imported By: Lanelle Bal 03/26/2009 10:16:38  _____________________________________________________________________  External Attachment:    Type:   Image     Comment:   External Document

## 2010-02-10 NOTE — Assessment & Plan Note (Signed)
Summary: fu on bloodwork/kdc   Vital Signs:  Patient profile:   60 year old male Weight:      275.4 pounds Pulse rate:   72 / minute Resp:     17 per minute BP sitting:   122 / 64  (left arm) Cuff size:   large  Vitals Entered By: Shonna Chock (April 26, 2009 3:09 PM) CC: Follow-up visit: Discuss labs  2.) Refill meds and ? best time to take meds  Comments REVIEWED MED LIST, PATIENT AGREED DOSE AND INSTRUCTION CORRECT    Primary Care Provider:  Alfonse Flavors  CC:  Follow-up visit: Discuss labs  2.) Refill meds and ? best time to take meds .  History of Present Illness: A1c was 7% , up from 6.6% in 12/2008. Risks discussed; his wife has begun making candy & cakes . FBS & post meal glucoses not separated . No CVE due to back pain. Weight up 7#. Mild  hypoglycemia occasionally;resolution with ingestionof  orange or candy. Dr Sandria Manly Rxed ESI > 12 montths ago for radicular RLE pain. Off Tricor X 2 weeks; Vytorin costs $90 for 3 months.  Allergies: 1)  ! Pcn  Review of Systems General:  Complains of fatigue; denies weight loss. Eyes:  Denies blurring, double vision, and vision loss-both eyes; Last exam 6 months ago,no retinopathy. CV:  Denies chest pain or discomfort, leg cramps with exertion, lightheadness, and near fainting. Derm:  Denies poor wound healing. Neuro:  Complains of numbness; denies brief paralysis, tingling, and weakness; Positional numbness in feet.. Endo:  Complains of excessive hunger; denies excessive thirst and excessive urination.  Physical Exam  General:  in no acute distress; alert,appropriate and cooperative throughout examination Head:  mask facies Neck:  No deformities, masses, or tenderness noted. Lungs:  Normal respiratory effort, chest expands symmetrically. Lungs are clear to auscultation, no crackles or wheezes. Heart:  Normal rate and regular rhythm. S1 and S2 normal without gallop, murmur, click, rub. S4 Abdomen:  Bowel sounds positive,abdomen soft and  non-tender without masses, organomegaly or hernias noted. Pulses:  R and L carotid,radial,dorsalis pedis and posterior tibial pulses are full and equal bilaterally Extremities:  No clubbing, cyanosis, edema. Amputuation PIP R index finger. Neurologic:  alert & oriented X3, strength normal in all extremities, sensation intact to light touch over feet , and DTRs symmetrical and 1/2+. tremor bilateral UE Skin:  Intact without suspicious lesions or rashes Psych:  memory intact for recent and remote, normally interactive, and good eye contact.     Impression & Recommendations:  Problem # 1:  DIABETES MELLITUS, UNCONTROLLED (ICD-250.02) Dietary changes to be implemented His updated medication list for this problem includes:    Amaryl 2 Mg Tabs (Glimepiride) .Marland Kitchen... 1 by mouth once daily, additional refills require appointment    Cozaar 50 Mg Tabs (Losartan potassium) .Marland Kitchen... Take one tablet daily    Metformin Hcl 1000 Mg Tabs (Metformin hcl) .Marland Kitchen... Take one tablet twice daily    Aspirin 81 Mg Tbec (Aspirin) .Marland Kitchen... Take one tablet by mouth daily  Problem # 2:  HYPERTENSION, ESSENTIAL NOS (ICD-401.9) Controlled His updated medication list for this problem includes:    Cozaar 50 Mg Tabs (Losartan potassium) .Marland Kitchen... Take one tablet daily    Metoprolol Succinate 50 Mg Xr24h-tab (Metoprolol succinate) .Marland Kitchen... Take one tablet by mouth daily    Furosemide 80 Mg Tabs (Furosemide) .Marland Kitchen... Take one tablet in the am and one half tablet in the pm  Problem # 3:  HYPERLIPIDEMIA NEC/NOS (ICD-272.4)  The following medications were removed from the medication list:    Tricor 145 Mg Tabs (Fenofibrate) .Marland Kitchen... Take one tablet daily    Vytorin 10-40 Mg Tabs (Ezetimibe-simvastatin) .Marland Kitchen... Take one tablet daily His updated medication list for this problem includes:    Crestor 20 Mg Tabs (Rosuvastatin calcium) .Marland Kitchen... 1  each am  Complete Medication List: 1)  Methimazole 10 Mg Tabs (Methimazole) .Marland Kitchen.. 1 by mouth once daily,  additional refills require appointment 2)  Amaryl 2 Mg Tabs (Glimepiride) .Marland Kitchen.. 1 by mouth once daily, additional refills require appointment 3)  Cozaar 50 Mg Tabs (Losartan potassium) .... Take one tablet daily 4)  Metoprolol Succinate 50 Mg Xr24h-tab (Metoprolol succinate) .... Take one tablet by mouth daily 5)  Furosemide 80 Mg Tabs (Furosemide) .... Take one tablet in the am and one half tablet in the pm 6)  Metformin Hcl 1000 Mg Tabs (Metformin hcl) .... Take one tablet twice daily 7)  Xalatan 0.005 % Soln (Latanoprost) .... Use as directed 8)  Aspirin 81 Mg Tbec (Aspirin) .... Take one tablet by mouth daily 9)  Multivitamin  10)  Trihexyphen 2mg  Tid  11)  Mirapex .5 Tid  12)  Tramadol Hcl 50 Mg Tabs (Tramadol hcl) .Marland Kitchen.. 1 by mouth every 6 hours as needed 13)  Klor-con 10 10 Meq Cr-tabs (Potassium chloride) .... Take 3 tablets by mouth once a day 14)  Crestor 20 Mg Tabs (Rosuvastatin calcium) .Marland Kitchen.. 1  each am  Patient Instructions: 1)  Please schedule a follow-up appointment in 3 months. 2)  BUN,creat,K+ prior to visit, ICD-9: 401.9 3)  Hepatic Panel prior to visit, ICD-9:995.20 4)  Lipid Panel prior to visit, ICD-9:272.4 5)  HbgA1C prior to visit, ICD-9:250.02. Consume LESS THAN 40 grams of sugar/ day from High Fructose Corn Syrup as discussed. Prescriptions: TRAMADOL HCL 50 MG TABS (TRAMADOL HCL) 1 by mouth every 6 hours as needed  #60 x 5   Entered and Authorized by:   Marga Melnick MD   Signed by:   Marga Melnick MD on 04/26/2009   Method used:   Print then Give to Patient   RxID:   [id number] METFORMIN HCL 1000 MG  TABS (METFORMIN HCL) TAKE ONE TABLET TWICE DAILY  #180 x 1   Entered and Authorized by:   Marga Melnick MD   Signed by:   Marga Melnick MD on 04/26/2009   Method used:   Print then Give to Patient   RxID:   [id number] AMARYL 2 MG  TABS (GLIMEPIRIDE) 1 by mouth once daily, ADDITIONAL REFILLS REQUIRE APPOINTMENT  #90 x 1   Entered and Authorized by:    Marga Melnick MD   Signed by:   Marga Melnick MD on 04/26/2009   Method used:   Print then Give to Patient   RxID:   [id number] CRESTOR 20 MG TABS (ROSUVASTATIN CALCIUM) 1  each am  #30 x 2   Entered and Authorized by:   Marga Melnick MD   Signed by:   Marga Melnick MD on 04/26/2009   Method used:   Print then Give to Patient   RxID:   [id number]

## 2010-02-10 NOTE — Letter (Signed)
Summary: [facility]  [facility]   Imported By: Lanelle Bal 12/20/2009 09:40:16  _____________________________________________________________________  External Attachment:    Type:   Image     Comment:   External Document

## 2010-02-10 NOTE — Letter (Signed)
Summary: [facility] - Lipid  [facility], Main Office  [address]   [place], [place] [address]   Phone: [phone]  Fax: [phone]     May 13, 2009 MRN: [id number]   Henry [name] [address] [place], [place]  [address]   Dear [name],  We have reviewed your cholesterol results.  They are as follows:     Total Cholesterol:    109 (Desirable: less than 200)       HDL  Cholesterol:     34.30  (Desirable: greater than 40 for men and 50 for women)       LDL Cholesterol:       57  (Desirable: less than 100 for low risk and less than 70 for moderate to high risk)       Triglycerides:       88.0  (Desirable: less than 150)  Our recommendations include:These numbers look good. Continue on the same medicine. Sodium, potassium, kidney and Liver function are normal. Take care, Dr. Darel Hong.    Call our office at the number listed above if you have any questions.  Lowering your LDL cholesterol is important, but it is only one of a large number of "risk factors" that may indicate that you are at risk for heart disease, stroke or other complications of hardening of the arteries.  Other risk factors include:   A.  Cigarette Smoking* B.  High Blood Pressure* C.  Obesity* D.   Low HDL Cholesterol (see yours above)* E.   Diabetes Mellitus (higher risk if your is uncontrolled) F.  Family history of premature heart disease G.  Previous history of stroke or cardiovascular disease    *These are risk factors YOU HAVE CONTROL OVER.  For more information, visit .  There is now evidence that lowering the TOTAL CHOLESTEROL AND LDL CHOLESTEROL can reduce the risk of heart disease.  The American Heart Association recommends the following guidelines for the treatment of elevated cholesterol:  1.  If there is now current heart disease and less than two risk factors, TOTAL CHOLESTEROL should be less than 200 and LDL CHOLESTEROL should be less than 100. 2.  If there  is current heart disease or two or more risk factors, TOTAL CHOLESTEROL should be less than 200 and LDL CHOLESTEROL should be less than 70.  A diet low in cholesterol, saturated fat, and calories is the cornerstone of treatment for elevated cholesterol.  Cessation of smoking and exercise are also important in the management of elevated cholesterol and preventing vascular disease.  Studies have shown that 30 to 60 minutes of physical activity most days can help lower blood pressure, lower cholesterol, and keep your weight at a healthy level.  Drug therapy is used when cholesterol levels do not respond to therapeutic lifestyle changes (smoking cessation, diet, and exercise) and remains unacceptably high.  If medication is started, it is important to have you levels checked periodically to evaluate the need for further treatment options.  Thank you,    [facility] [employer] Team

## 2010-02-10 NOTE — Progress Notes (Signed)
Summary: refill request to [facility]   Phone Note Refill Request Message from:  Fax from Pharmacy on February 17, 2009 10:24 AM  Refills Requested: Medication #1:  FUROSEMIDE 80 MG  TABS TAKE ONE TABLET IN THE AM AND ONE HALF TABLET IN THE PM   Dosage confirmed as above?Dosage Confirmed Initial call taken by: Michaelle Copas,  February 17, 2009 10:25 AM    Prescriptions: FUROSEMIDE 80 MG  TABS (FUROSEMIDE) TAKE ONE TABLET IN THE AM AND ONE HALF TABLET IN THE PM  #135 x 1   Entered by:   Shonna Chock   Authorized by:   Marga Melnick MD   Signed by:   Shonna Chock on 02/17/2009   Method used:   Faxed to ...       [facility] (mail-order)             , [place]         Ph:        Fax: [phone]   RxID:   [id number]

## 2010-02-10 NOTE — Medication Information (Signed)
Summary: Fax Regarding Aldosterone Antagonist Therapy/[employer]  Fax Regarding Aldosterone Antagonist Therapy/[employer]   Imported By: Lanelle Bal 01/05/2010 10:12:21  _____________________________________________________________________  External Attachment:    Type:   Image     Comment:   External Document

## 2010-02-10 NOTE — Letter (Signed)
Summary: [facility]  [facility]   Imported By: Lanelle Bal 03/11/2009 08:13:35  _____________________________________________________________________  External Attachment:    Type:   Image     Comment:   External Document

## 2010-02-10 NOTE — Consult Note (Signed)
Summary: [facility]  [facility]   Imported By: Lanelle Bal 07/28/2009 10:52:56  _____________________________________________________________________  External Attachment:    Type:   Image     Comment:   External Document

## 2010-02-10 NOTE — Progress Notes (Signed)
Summary: Refill Request  Phone Note Refill Request Call back at Home Phone [phone] Message from:  Patient  Refills Requested: Medication #1:  METFORMIN HCL 1000 MG  TABS TAKE ONE TABLET TWICE DAILY [facility]   Method Requested: Fax to Local Pharmacy Initial call taken by: Shonna Chock,  April 12, 2009 4:54 PM  Follow-up for Phone Call        Left message on VM informing patient he is due for labs per labs done on 12/17/2008:  Copied and pasted: Check A1c in 4 months (250.00) with urine microalbumin.  [facility]  Follow-up by: Shonna Chock,  April 13, 2009 10:26 AM    Prescriptions: METFORMIN HCL 1000 MG  TABS (METFORMIN HCL) TAKE ONE TABLET TWICE DAILY  #180 x 0   Entered by:   Shonna Chock   Authorized by:   Marga Melnick MD   Signed by:   Shonna Chock on 04/12/2009   Method used:   Electronically to        [id number] (ret[facility])       [address]       [place], [place]  [address]       Ph: [phone]       Fax: [phone]   RxID:   [id number]

## 2010-02-10 NOTE — Miscellaneous (Signed)
Summary: [facility]  [facility]   Imported By: Lanelle Bal 04/01/2009 10:15:27  _____________________________________________________________________  External Attachment:    Type:   Image     Comment:   External Document

## 2010-02-10 NOTE — Progress Notes (Signed)
Summary: refill meds   Phone Note Refill Request Call back at Home Phone [phone] Message from:  Patient on April 12, 2009 3:43 PM  Refills Requested: Medication #1:  TRICOR 145 MG  TABS TAKE ONE TABLET DAILY  Medication #2:  METFORMIN HCL 1000 MG  TABS TAKE ONE TABLET TWICE DAILY  Medication #3:  COZAAR 50 MG  TABS TAKE ONE TABLET DAILY [facility] [address]    Method Requested: Fax to Local Pharmacy Initial call taken by: Lorne Skeens,  April 12, 2009 3:44 PM  Follow-up for Phone Call        filled meds and called pt to let her know i refilled the meds except metformin pt will call Dr. Alwyn Ren office for that refill Follow-up by: Kem Parkinson,  April 12, 2009 4:27 PM    Prescriptions: COZAAR 50 MG  TABS (LOSARTAN POTASSIUM) TAKE ONE TABLET DAILY  #90 x 3   Entered by:   Kem Parkinson   Authorized by:   Ferman Hamming, MD, [facility]   Signed by:   Kem Parkinson on 04/12/2009   Method used:   Faxed to ...       [facility] (mail-order)             , [place]         Ph:        Fax: [phone]   RxID:   [id number] METOPROLOL SUCCINATE 50 MG XR24H-TAB (METOPROLOL SUCCINATE) Take one tablet by mouth daily  #90 x 3   Entered by:   Kem Parkinson   Authorized by:   Ferman Hamming, MD, [facility]   Signed by:   Kem Parkinson on 04/12/2009   Method used:   Faxed to ...       [facility] (mail-order)             , [place]         Ph:        Fax: [phone]   RxID:   [id number] METOPROLOL SUCCINATE 50 MG XR24H-TAB (METOPROLOL SUCCINATE) Take one tablet by mouth daily  #30 x 1   Entered by:   Kem Parkinson   Authorized by:   Ferman Hamming, MD, [facility]   Signed by:   Kem Parkinson on 04/12/2009   Method used:   Electronically to        [facility]  [address] [phone]* (retail)       [address]       [place], [place]  [address]       Ph: [phone]       Fax: [phone]   RxID:   [id number] COZAAR 50 MG   TABS (LOSARTAN POTASSIUM) TAKE ONE TABLET DAILY  #30 x 1   Entered by:   Kem Parkinson   Authorized by:   Ferman Hamming, MD, [facility]   Signed by:   Kem Parkinson on 04/12/2009   Method used:   [facility]ically [facility]        [facility]  [address] [phone][facility] ([facility]ail)       [address]       [place], [place]  [address]       Ph: [phone]       Fax: [phone]   RxID:   [id number]

## 2010-02-10 NOTE — Progress Notes (Signed)
Summary: Refill Request  Phone Note Refill Request   Refills Requested: Medication #1:  METHIMAZOLE 10 MG TABS 1 by mouth once daily  Method Requested: Fax to Local Pharmacy Initial call taken by: Shonna Chock,  July 07, 2009 8:57 AM  Follow-up for Phone Call        Patient's wife called and indicated that patient will schedule appointment and can we please send in rx for patient.   I spoke with the patient and informed him 30 day supply sent in Follow-up by: Shonna Chock,  July 07, 2009 8:58 AM    Prescriptions: METHIMAZOLE 10 MG TABS (METHIMAZOLE) 1 by mouth once daily, ADDITIONAL REFILLS REQUIRE APPOINTMENT  #30 x 0   Entered by:   Shonna Chock   Authorized by:   Marga Melnick MD   Signed by:   Shonna Chock on 07/07/2009   Method used:   [facility]        [facility]  [address] [id number]       [address]       [place], [place]  [address]       Ph: [phone]       Fax: [phone]   RxID:   [id number]

## 2010-02-10 NOTE — Assessment & Plan Note (Signed)
Summary: 3 MONTH OV AND LABS//PH   Vital Signs:  Patient profile:   60 year old male Weight:      273.4 pounds Temp:     98.4 degrees F oral Pulse rate:   72 / minute Resp:     19 per minute BP sitting:   130 / 80  (left arm) Cuff size:   large  Vitals Entered By: Shonna Chock CMA (August 31, 2009 11:05 AM) CC: 1.) 3 month follow and labs  2.) Discuss Crestor-? side effects, Type 2 diabetes mellitus follow-up   Primary Care Provider:  Alfonse Flavors  CC:  1.) 3 month follow and labs  2.) Discuss Crestor-? side effects and Type 2 diabetes mellitus follow-up.  History of Present Illness: Hyperlipidemia Follow-Up      This is a 60 year old man who presents for Hyperlipidemia follow-up.  The patient reports  some muscle aches,  chronic constipation, and fatigue, but denies GI upset, abdominal pain, flushing, itching, and diarrhea.  Other symptoms include chest pain/pressure, exercise intolerance, and dypsnea walking up a hill 100 yards.  The patient denies the following symptoms: palpitations, syncope, and pedal edema.  Compliance with medications (by patient report) has been near 100%.  Dietary compliance has been good.  The patient reports exercising 3-4X per week as water exercises.  Adjunctive measures currently used by the patient include ASA.  "Flu -like " symptoms with aching diffusely, chills & sweats  & temp to 102  for 12 hrs X 2  since Crestor started; last episode last week.LDL was 55 in05/2011, 1 month after Crestor started. LFTs were normal. Type 2 Diabetes Mellitus Follow-Up      The patient is also here for Type 2 diabetes mellitus follow-up.  The patient reports polydipsia, but denies polyuria, blurred vision, self managed hypoglycemia, weight loss, weight gain, and numbness of extremities.  Other symptoms include  slight orthostatic symptoms occasionally .  The patient denies the following symptoms: neuropathic pain, vomiting, poor wound healing, intermittent claudication, vision loss,  and foot ulcer.  Since the last visit the patient reports compliance with medications.  The patient has been measuring capillary blood glucose before breakfast (166-219) and after dinner ( 145-243).  Since the last visit, the patient reports having had eye care by an ophthalmologist and no foot care.  A1c has climbed from 7% to 8 % ; ie poorly controlled DM with 69% risk long term.  Current Medications (verified): 1)  Methimazole 10 Mg Tabs (Methimazole) .Marland Kitchen.. 1 By Mouth Once Daily, Additional Refills Require Appointment 2)  Amaryl 2 Mg  Tabs (Glimepiride) .Marland Kitchen.. 1 By Mouth Once Daily, Additional Refills Require Appointment 3)  Cozaar 50 Mg  Tabs (Losartan Potassium) .... Take One Tablet Daily 4)  Metoprolol Succinate 50 Mg Xr24h-Tab (Metoprolol Succinate) .... Take One Tablet By Mouth Daily 5)  Furosemide 80 Mg  Tabs (Furosemide) .... Take One Tablet in The Am and One Half Tablet in The Pm 6)  Metformin Hcl 1000 Mg  Tabs (Metformin Hcl) .... Take One Tablet Twice Daily, **a1c Due 250.02** 7)  Travatan Z 0.004 % Soln (Travoprost) .Marland Kitchen.. 1 Drop in Right Eye Once Daily 8)  Aspirin 81 Mg Tbec (Aspirin) .... Take One Tablet By Mouth Daily 9)  Antioxident Formula .Marland Kitchen.. 1 Tab By Mouth Once Daily 10)  Trihexyphenidyl Hcl 2 Mg Tabs (Trihexyphenidyl Hcl) .Marland Kitchen.. 1 Tab By Mouth Three Times A Day 11)  Mirapex 0.5 Mg Tabs (Pramipexole Dihydrochloride) .Marland Kitchen.. 1 Tab By Mouth Three  Times A Day 12)  Tramadol Hcl 50 Mg Tabs (Tramadol Hcl) .Marland Kitchen.. 1 By Mouth Every 6 Hours As Needed 13)  Klor-Con 10 10 Meq Cr-Tabs (Potassium Chloride) .... Take 3 Tablets By Mouth Once A Day 14)  Crestor 20 Mg Tabs (Rosuvastatin Calcium) .Marland Kitchen.. 1  Each Am  Allergies: 1)  ! Pcn  Physical Exam  General:  in no acute distress; alert,appropriate and cooperative throughout examination Lungs:  Normal respiratory effort, chest expands symmetrically. Lungs are clear to auscultation, no crackles or wheezes. Heart:  regular rhythm, no murmur, no gallop,  no rub, no JVD, and bradycardia.   Pulses:  R and L carotid,radial,dorsalis pedis and posterior tibial pulses are full and equal bilaterally Extremities:  No clubbing, cyanosis, edema. Good nail health Neurologic:  alert & oriented X3.  Parkinson's signs Skin:  Intact without suspicious lesions or rashes Cervical Nodes:  No lymphadenopathy noted Axillary Nodes:  No palpable lymphadenopathy Psych:  memory intact for recent and remote, normally interactive, and good eye contact.     Impression & Recommendations:  Problem # 1:  DIABETES MELLITUS, UNCONTROLLED (ICD-250.02)  The following medications were removed from the medication list:    Metformin Hcl 1000 Mg Tabs (Metformin hcl) .Marland Kitchen... Take one tablet twice daily, **a1c due 250.02** His updated medication list for this problem includes:    Amaryl 2 Mg Tabs (Glimepiride) .Marland Kitchen... 1 by mouth once daily, additional refills require appointment    Cozaar 50 Mg Tabs (Losartan potassium) .Marland Kitchen... Take one tablet daily    Aspirin 81 Mg Tbec (Aspirin) .Marland Kitchen... Take one tablet by mouth daily    Janumet 50-1000 Mg Tabs (Sitagliptin-metformin hcl) .Marland Kitchen... 1 two times a day with 2 largest meals  Problem # 2:  MUSCLE PAIN (ICD-729.1) Possible drug reaction His updated medication list for this problem includes:    Aspirin 81 Mg Tbec (Aspirin) .Marland Kitchen... Take one tablet by mouth daily    Tramadol Hcl 50 Mg Tabs (Tramadol hcl) .Marland Kitchen... 1 by mouth every 6 hours as needed  Complete Medication List: 1)  Methimazole 10 Mg Tabs (Methimazole) .Marland Kitchen.. 1 by mouth once daily, additional refills require appointment 2)  Amaryl 2 Mg Tabs (Glimepiride) .Marland Kitchen.. 1 by mouth once daily, additional refills require appointment 3)  Cozaar 50 Mg Tabs (Losartan potassium) .... Take one tablet daily 4)  Metoprolol Succinate 50 Mg Xr24h-tab (Metoprolol succinate) .... Take one tablet by mouth daily 5)  Furosemide 80 Mg Tabs (Furosemide) .... Take one tablet in the am and one half tablet in the  pm 6)  Travatan Z 0.004 % Soln (Travoprost) .Marland Kitchen.. 1 drop in right eye once daily 7)  Aspirin 81 Mg Tbec (Aspirin) .... Take one tablet by mouth daily 8)  Antioxident Formula  .Marland Kitchen.. 1 tab by mouth once daily 9)  Trihexyphenidyl Hcl 2 Mg Tabs (Trihexyphenidyl hcl) .Marland Kitchen.. 1 tab by mouth three times a day 10)  Mirapex 0.5 Mg Tabs (Pramipexole dihydrochloride) .Marland Kitchen.. 1 tab by mouth three times a day 11)  Tramadol Hcl 50 Mg Tabs (Tramadol hcl) .Marland Kitchen.. 1 by mouth every 6 hours as needed 12)  Klor-con 10 10 Meq Cr-tabs (Potassium chloride) .... Take 3 tablets by mouth once a day 13)  Crestor 20 Mg Tabs (Rosuvastatin calcium) .Marland Kitchen.. 1  each am 14)  Janumet 50-1000 Mg Tabs (Sitagliptin-metformin hcl) .Marland Kitchen.. 1 two times a day with 2 largest meals  Patient Instructions: 1)  Consume LESS THAN 40 grams of High Fructose Corn Syrup sugar/ day.Come in if the "  Flu" symptoms recur. 2)  Please schedule a follow-up appointment in 2 months. 3)  BUN,creat, K+ prior to visit, ICD-9:250.02 4)  Hepatic Panel  AND CPK prior to visit, ICD-9:995.20 5)  Lipid Panel prior to visit, ICD-9:272.4 6)  HbgA1C prior to visit, ICD-9:250.02 7)  Urine Microalbumin prior to visit, ICD-9:250.02.  Prescriptions: JANUMET 50-1000 MG TABS (SITAGLIPTIN-METFORMIN HCL) 1 two times a day with 2 largest meals  #56 x 0   Entered and Authorized by:   Marga Melnick MD   Signed by:   Marga Melnick MD on 08/31/2009   Method used:   Samples Given   RxID:   [id number]

## 2010-02-10 NOTE — Letter (Signed)
Summary: [facility]  [facility]   Imported By: Lanelle Bal 06/25/2009 09:57:26  _____________________________________________________________________  External Attachment:    Type:   Image     Comment:   External Document

## 2010-02-10 NOTE — Progress Notes (Signed)
Summary: REFILL  Phone Note Refill Request Message from:  Fax from Pharmacy on March 03, 2009 8:42 AM  Refills Requested: Medication #1:  METHIMAZOLE 10 MG TABS 1 by mouth once daily-NEEDS LABS GLIMEPIRIDE 2MG     Method Requested: Mail to Pharmacy Next Appointment Scheduled: NO APPT Initial call taken by: Barb Merino,  March 03, 2009 8:43 AM    New/Updated Medications: METHIMAZOLE 10 MG TABS (METHIMAZOLE) 1 by mouth once daily, ADDITIONAL REFILLS REQUIRE APPOINTMENT AMARYL 2 MG  TABS (GLIMEPIRIDE) 1 by mouth once daily, ADDITIONAL REFILLS REQUIRE APPOINTMENT Prescriptions: AMARYL 2 MG  TABS (GLIMEPIRIDE) 1 by mouth once daily, ADDITIONAL REFILLS REQUIRE APPOINTMENT  #90 x 0   Entered by:   Shonna Chock   Authorized by:   Marga Melnick MD   Signed by:   Shonna Chock on 03/03/2009   Method used:   Faxed to ...       [facility] (mail-order)             , [place]         Ph:        Fax: [phone]   RxID:   [id number] METHIMAZOLE 10 MG TABS (METHIMAZOLE) 1 by mouth once daily, ADDITIONAL REFILLS REQUIRE APPOINTMENT  #90 x 0   Entered by:   Shonna Chock   Authorized by:   Marga Melnick MD   Signed by:   Shonna Chock on 03/03/2009   Method used:   Faxed to ...       [facility] (mail-order)             , [place]         Ph:        Fax: [phone]   RxID:   [id number]

## 2010-02-10 NOTE — Medication Information (Signed)
Summary: Nonadherence with [employer]  Nonadherence with [employer]   Imported By: Lanelle Bal 03/19/2009 09:54:41  _____________________________________________________________________  External Attachment:    Type:   Image     Comment:   External Document

## 2010-02-10 NOTE — Assessment & Plan Note (Signed)
Summary: f1y/dm  Medications Added MULTIVITAMINS   TABS (MULTIPLE VITAMIN) 1 tab by mouth once daily TRIHEXYPHENIDYL HCL 2 MG TABS (TRIHEXYPHENIDYL HCL) 1 tab by mouth three times a day MIRAPEX 0.5 MG TABS (PRAMIPEXOLE DIHYDROCHLORIDE) 1 tab by mouth three times a day        Primary Provider:  Alfonse Flavors   History of Present Illness: 60 year old male with past medical history of coronary artery disease status post bypass and graft, Parkinson's, diabetes and hypertension as well as hyperlipidemia. His last Myoview was performed on May 10, 2007. At that time ejection fraction was 39%. There was a prior anterior and apical infarct but no ischemia. Carotid Dopplers in January 2005 showed 0-39% stenosis. Echocardiogram in 2005 showed grossly normal LV function but technically difficult images. I last saw the patient in April, 2010. Since then he denies any increased dyspnea, chest pain, palpitations, or syncope.  Preventive Screening-Counseling & Management  Alcohol-Tobacco     Smoking Status: never  Current Medications (verified): 1)  Methimazole 10 Mg Tabs (Methimazole) .Marland Kitchen.. 1 By Mouth Once Daily, Additional Refills Require Appointment 2)  Amaryl 2 Mg  Tabs (Glimepiride) .Marland Kitchen.. 1 By Mouth Once Daily, Additional Refills Require Appointment 3)  Cozaar 50 Mg  Tabs (Losartan Potassium) .... Take One Tablet Daily 4)  Metoprolol Succinate 50 Mg Xr24h-Tab (Metoprolol Succinate) .... Take One Tablet By Mouth Daily 5)  Furosemide 80 Mg  Tabs (Furosemide) .... Take One Tablet in The Am and One Half Tablet in The Pm 6)  Metformin Hcl 1000 Mg  Tabs (Metformin Hcl) .... Take One Tablet Twice Daily 7)  Xalatan 0.005 %  Soln (Latanoprost) .... Use As Directed 8)  Aspirin 81 Mg Tbec (Aspirin) .... Take One Tablet By Mouth Daily 9)  Multivitamins   Tabs (Multiple Vitamin) .Marland Kitchen.. 1 Tab By Mouth Once Daily 10)  Trihexyphenidyl Hcl 2 Mg Tabs (Trihexyphenidyl Hcl) .Marland Kitchen.. 1 Tab By Mouth Three Times A Day 11)  Mirapex 0.5  Mg Tabs (Pramipexole Dihydrochloride) .Marland Kitchen.. 1 Tab By Mouth Three Times A Day 12)  Tramadol Hcl 50 Mg Tabs (Tramadol Hcl) .Marland Kitchen.. 1 By Mouth Every 6 Hours As Needed 13)  Klor-Con 10 10 Meq Cr-Tabs (Potassium Chloride) .... Take 3 Tablets By Mouth Once A Day 14)  Crestor 20 Mg Tabs (Rosuvastatin Calcium) .Marland Kitchen.. 1  Each Am  Allergies: 1)  ! Pcn  Past History:  Past Medical History: CARDIOMYOPATHY (ICD-425.4) C A D (ICD-414.00) HYPERTENSION, ESSENTIAL NOS (ICD-401.9) MYOCARDIAL INFARCTION, HX OF (ICD-412) hx of 1996 SLEEP APNEA (ICD-780.57) GERD (ICD-530.81) HYPERTHYROIDISM (ICD-242.90) DIABETES MELLITUS, UNCONTROLLED (ICD-250.02) HYPERPLASIA PROSTATE UNS W/O UR OBST & OTH LUTS (ICD-600.90) BENIGN NEOPLASM OF COLON (ICD-211.3) NEPHROLITHIASIS, HX OF (ICD-V13.01) HERNIA, VENTRAL (ICD-553.20) HYPERLIPIDEMIA NEC/NOS (ICD-272.4) PARKINSON'S (ICD-332.0)  Past Surgical History: Reviewed history from 05/11/2009 and no changes required. Coronary artery bypass graft 2000; stent 1998 Median sternotomy for cornary artery bypass grafting times three (left internal mamary artery to the distal left anterior descending cornary artery, saphenous vein graft to second diagonal branch and saphenous vein grfat to distal right cornary artery. 05/20/98 Clarence H. Cornelius Moras, M.D.  Deep brain stimulator 2005 ; Lithotripsy X 3 Colon polypectomy Replacement of Kinetra implantable pulse generator.  Social History: Reviewed history from 09/02/2007 and no changes required. Tobacco Use - No.   Review of Systems       Problems with resting tremor, low back pain and leg pain but no fevers or chills, productive cough, hemoptysis, dysphasia, odynophagia, melena, hematochezia, dysuria, hematuria, rash, seizure activity,  orthopnea, PND, pedal edema, claudication. Remaining systems are negative.   Vital Signs:  Patient profile:   60 year old male Height:      69 inches Weight:      275 pounds BMI:     40.76 Pulse  rate:   69 / minute Resp:     14 per minute BP sitting:   119 / 77  (left arm)  Vitals Entered By: Kem Parkinson (May 13, 2009 9:05 AM)  Physical Exam  General:  Well-developed well-nourished in no acute distress.  Skin is warm and dry.  HEENT is normal.  Neck is supple. No thyromegaly.  Chest is clear to auscultation with normal expansion.  Cardiovascular exam is regular rate and rhythm.  Abdominal exam nontender or distended. No masses palpated. Extremities show no edema. neuro grossly intact    EKG  Procedure date:  05/13/2009  Findings:      Normal sinus rhythm at a rate of 67. Axis normal. Nonspecific ST-T wave changes. Poor R-wave progression.  Impression & Recommendations:  Problem # 1:  C A D (ICD-414.00) Continue aspirin, beta blocker, ARB and statin. Plan repeat Myoview when he returns in one year. His updated medication list for this problem includes:    Metoprolol Succinate 50 Mg Xr24h-tab (Metoprolol succinate) .Marland Kitchen... Take one tablet by mouth daily    Aspirin 81 Mg Tbec (Aspirin) .Marland Kitchen... Take one tablet by mouth daily  Problem # 2:  HYPERTENSION, ESSENTIAL NOS (ICD-401.9) Blood pressure controlled on present medications. Will continue. Check bmet. His updated medication list for this problem includes:    Cozaar 50 Mg Tabs (Losartan potassium) .Marland Kitchen... Take one tablet daily    Metoprolol Succinate 50 Mg Xr24h-tab (Metoprolol succinate) .Marland Kitchen... Take one tablet by mouth daily    Furosemide 80 Mg Tabs (Furosemide) .Marland Kitchen... Take one tablet in the am and one half tablet in the pm    Aspirin 81 Mg Tbec (Aspirin) .Marland Kitchen... Take one tablet by mouth daily  Orders: TLB-BMP (Basic Metabolic Panel-BMET) (80048-METABOL)  Problem # 3:  DIABETES MELLITUS, UNCONTROLLED (ICD-250.02) Management per primary care. His updated medication list for this problem includes:    Amaryl 2 Mg Tabs (Glimepiride) .Marland Kitchen... 1 by mouth once daily, additional refills require appointment    Cozaar 50 Mg  Tabs (Losartan potassium) .Marland Kitchen... Take one tablet daily    Metformin Hcl 1000 Mg Tabs (Metformin hcl) .Marland Kitchen... Take one tablet twice daily    Aspirin 81 Mg Tbec (Aspirin) .Marland Kitchen... Take one tablet by mouth daily  Problem # 4:  HYPERLIPIDEMIA NEC/NOS (ICD-272.4) Continue statin. Check lipids and liver. His updated medication list for this problem includes:    Crestor 20 Mg Tabs (Rosuvastatin calcium) .Marland Kitchen... 1  each am  Orders: TLB-Lipid Panel (80061-LIPID) TLB-Hepatic/Liver Function Pnl (80076-HEPATIC)  Problem # 5:  PARKINSON'S (ICD-332.0) Management per neurology.  Problem # 6:  HYPERTHYROIDISM (ICD-242.90)  His updated medication list for this problem includes:    Methimazole 10 Mg Tabs (Methimazole) .Marland Kitchen... 1 by mouth once daily, additional refills require appointment    Metoprolol Succinate 50 Mg Xr24h-tab (Metoprolol succinate) .Marland Kitchen... Take one tablet by mouth daily  Problem # 7:  SLEEP APNEA (ICD-780.57)  Problem # 8:  GERD (ICD-530.81)  Patient Instructions: 1)  Your physician recommends that you schedule a follow-up appointment in: ONE YEAR

## 2010-04-13 ENCOUNTER — Other Ambulatory Visit: Payer: Self-pay | Admitting: Internal Medicine

## 2010-04-15 ENCOUNTER — Other Ambulatory Visit: Payer: Self-pay | Admitting: Family Medicine

## 2010-04-18 LAB — BASIC METABOLIC PANEL
Chloride: 103 mEq/L (ref 96–112)
GFR calc non Af Amer: >60 mL/min (ref >60)
Glucose, Bld: 141 mg/dL — ABNORMAL HIGH (ref 70–99)
Potassium: 4.1 mEq/L (ref 3.5–5.1)
Sodium: 139 mEq/L (ref 135–145)

## 2010-04-18 LAB — CBC
HCT: 38.2 % — ABNORMAL LOW (ref 39.0–52.0)
Hemoglobin: 12.8 g/dL — ABNORMAL LOW (ref 13.0–17.0)
RDW: 14.4 % (ref 11.5–15.5)
WBC: 6.4 10*3/uL (ref 4.0–10.5)

## 2010-04-18 LAB — GLUCOSE, CAPILLARY: Glucose-Capillary: 87 mg/dL (ref 70–99)

## 2010-04-18 NOTE — Telephone Encounter (Signed)
Dr.Hopper's patient... Please advise if refills are ok.    KP

## 2010-04-18 NOTE — Telephone Encounter (Signed)
RX 3 months ; see 02/12 OV. He needs labs & F/U appt in June

## 2010-05-14 ENCOUNTER — Other Ambulatory Visit: Payer: Self-pay | Admitting: Internal Medicine

## 2010-05-21 ENCOUNTER — Encounter: Payer: Self-pay | Admitting: Cardiology

## 2010-05-24 ENCOUNTER — Ambulatory Visit (INDEPENDENT_AMBULATORY_CARE_PROVIDER_SITE_OTHER): Payer: Federal, State, Local not specified - PPO | Admitting: Cardiology

## 2010-05-24 ENCOUNTER — Encounter: Payer: Self-pay | Admitting: Cardiology

## 2010-05-24 DIAGNOSIS — E785 Hyperlipidemia, unspecified: Secondary | ICD-10-CM

## 2010-05-24 DIAGNOSIS — I2510 Atherosclerotic heart disease of native coronary artery without angina pectoris: Secondary | ICD-10-CM

## 2010-05-24 DIAGNOSIS — I10 Essential (primary) hypertension: Secondary | ICD-10-CM

## 2010-05-24 DIAGNOSIS — E7800 Pure hypercholesterolemia, unspecified: Secondary | ICD-10-CM

## 2010-05-24 LAB — BRAIN NATRIURETIC PEPTIDE: Pro B Natriuretic peptide (BNP): 17 pg/mL (ref 0.0–100.0)

## 2010-05-24 NOTE — Assessment & Plan Note (Signed)
Blood pressure controlled with present medications. Potassium and renal function monitored by primary care.

## 2010-05-24 NOTE — Assessment & Plan Note (Signed)
Continue beta blocker and ACE inhibitor. Patient euvolemic on examination but does have some dyspnea. Check BNP.

## 2010-05-24 NOTE — Assessment & Plan Note (Signed)
HEALTHCARE                            CARDIOLOGY OFFICE NOTE   KAILON, TREESE                     MRN:          [id number]  DATE:07/11/2006                            DOB:          1950/09/04    HISTORY OF PRESENT ILLNESS:  Mr. Stucky is a very pleasant 60 year old  gentleman who has a history of coronary disease, status post coronary  artery bypass graft performed in May of 2000. His most recent Myoview  was performed on November 13, 2005. At that time, his ejection fraction  was 41%. There was akinesis of the distal anterior wall and apex. There  was infarct with no ischemia. There was LVE. Since I last saw him, he  has done reasonably well. He has mild dyspnea on exertion, which is a  chronic issue but there is no orthopnea or PND. His pedal edema is  controlled with diuretics. He has no chest pain, palpitations, or  syncope.   MEDICATIONS:  Aspirin 81 mg daily, an anti-oxidant vitamin, __________ 2  mg p.o. t.i.d., potassium 20 meq p.o. b.i.d., Amaryl 2 mg p.o. daily,  Cozaar 50 mg p.o. daily, Lopressor 25 mg p.o. b.i.d., Tricor 145 mg p.o.  daily, methimazole 10 mg p.o. daily, Vytorin 10/40 daily, Mirapex 50 mg  p.o. t.i.d., Lasix 80 mg in the morning and 40 in the evening and  Metformin 1000 mg p.o. b.i.d.   PHYSICAL EXAMINATION:  VITAL SIGNS:  Blood pressure 114/65, pulse 73.  Weight 281 pounds.  HEENT:  Normal.  NECK:  Supple with no bruits.  CHEST:  Clear.  CARDIOVASCULAR:  Regular rate and rhythm.  ABDOMEN:  Examination is benign.  EXTREMITIES:  Trace edema.   LABORATORY DATA:  Electrocardiogram shows sinus rhythm at a rate of 73.  There is anterolateral T-wave inversion. It is unchanged from previous.   DIAGNOSES:  1. Coronary artery disease status post coronary artery bypass graft.      The patient is doing well with no recent chest pain. His Myoview in      the fall showed infarct with no ischemia. We will continue with      medical therapy including his aspirin, beta blocker, ARB, and      Statin.  2. Ischemic cardiomyopathy. As per #1.  3. Parkinson's. Per neurology.  4. History of hyperthyroidism. Per his primary care physician.  5. Diabetes mellitus. Per his primary care physician.  6. Hypertension. His blood pressure is well controlled on his present      medications.  7. History of hypokalemia. We will check a B-met today to follow his      potassium and renal function, given his diuretic use. We will also      repeat his liver functions.  8. Hyperlipidemia. He will continue on his Statin. His most recent      lipids in April were outstanding.  9. Sleep apnea.  10.Gastroesophageal reflux disease.   FOLLOWUP:  We will see him back in 9 months.     Madolyn Frieze Jens Som, MD, [facility]  Electronically Signed    BSC/MedQ  DD: 07/11/2006  DT: 07/11/2006  Job #: [id number]

## 2010-05-24 NOTE — Assessment & Plan Note (Signed)
[facility]                            CARDIOLOGY OFFICE NOTE   LADARIUS, SEUBERT                     MRN:          [id number]  DATE:05/03/2007                            DOB:          [birth year]    Mr. Fesperman is a pleasant gentleman who has a history of coronary artery  disease, status post coronary artery bypass graft.  His most recent  Myoview in November 2007, showed an ejection fraction of 41% with  akinesis of the distal anterior wall and apex.  There was infarct and no  ischemia.  Since I last saw him, he does have dyspnea with bending over  and with more moderate activities, but not with routine activities.  There is no orthopnea or PND, but there can occasionally be mild pedal  edema.  He has not had chest pain, palpitations or syncope.   MEDICATIONS:  1. Aspirin 81 mg p.o. day.  2. An antioxidant vitamin.  3. Trihexyphenidyl 2 mg p.o. t.i.d.  4. Potassium 20 mEq p.o. b.i.d.  5. Amaryl 2 mg p.o. daily.  6. Cozaar 50 mg p.o. daily.  7. Lopressor 25 mg p.o. b.i.d.  8. Tricor 145 mg p.o. daily.  9. Methimazole 10 mg p.o. daily.  10.Vytorin 10/40 daily.  11.Mirapex.  12.Lasix 80 mg in the morning and 40 in the evening.  13.Metformin 500 mg p.o. b.i.d.  14.Januvia.   PHYSICAL EXAMINATION:  VITAL SIGNS:  Today, shows a blood pressure of  111/72, pulse 76.  He weighs 287 pounds.  HEENT:  Normal.  NECK:  Supple and there are no bruits noted.  CHEST:  Clear.  CARDIOVASCULAR:  Regular rate rhythm.  ABDOMEN:  Shows no tenderness.  EXTREMITIES:  Show no edema.   Electrocardiogram shows a sinus rhythm at a rate of 78.  There is  anterior lateral T-wave inversion, as well as inferior T-wave inversion.   DIAGNOSES:  1. Coronary artery disease, status post coronary artery bypass graft -      we will plan to proceed with an adenosine Myoview for risk      stratification and to quantify his left ventricular function.  In      there is no  ischemia, then we will continue with medical therapy.      If his ejection fraction is less than 35, then he will need an EP      consult for the potential implantable cardioverter-defibrillator.      He will continue on his aspirin, beta-blocker, ARB and statin.  2. Ischemic cardiomyopathy - as per #1.  3. Parkinson's - managed per Dr. Remus Loffler, neurology.  4. History of hyperthyroidism - managed per Dr. Alwyn Ren.  5. Diabetes mellitus.  6. Hypertension - his blood pressure is adequately control.  We will      check a BMET, follow his potassium and renal function.  7. Hyperlipidemia - we will continue his statin.  We will check lipids      and liver and adjust as indicated.  8. Gastroesophageal reflux disease.   We will see him back in 1-year or sooner if necessary.  Madolyn Frieze Jens Som, MD, [facility]  Electronically Signed    BSC/MedQ  DD: 05/03/2007  DT: 05/03/2007  Job #: [id number]

## 2010-05-24 NOTE — Op Note (Signed)
Henry [name], Henry [name]              ACCOUNT NO.:  [id number]   MEDICAL RECORD NO.:  [id number]          PATIENT TYPE:  OIB   LOCATION:  3599                         FACILITY:  [facility]   PHYSICIAN:  Danae Orleans. Venetia Maxon, M.D.  DATE OF BIRTH:  01-May-1950   DATE OF PROCEDURE:  06/26/2008  DATE OF DISCHARGE:  06/26/2008                               OPERATIVE REPORT   PREOPERATIVE DIAGNOSIS:  Depleted implantable pulse generator for deep  brain stimulation for Parkinson's.   POSTOPERATIVE DIAGNOSIS:  Depleted implantable pulse generator for deep  brain stimulation for Parkinson's.   PROCEDURE:  Replacement of Kinetra implantable pulse generator.   SURGEON:  Danae Orleans. Venetia Maxon, MD   ANESTHESIA:  Local lidocaine with IV sedation.   COMPLICATIONS:  None.   DISPOSITION:  Recovery.   INDICATIONS:  Henry [name] is a 60 year old man with Parkinson  disease.  He has had a bilateral deep brain stimulator for Parkinson's  and has a Sports administrator which is now depleted.  He has had this for  about 5 years.  It was elected to take him to surgery for replacement of  his battery.   PROCEDURE:  [name] was brought to the operating room.  His right  frontal chest had been shaved and was prepped and draped in the usual  sterile fashion.  The area of planned incision was infiltrated with  local lidocaine and previous pocket was reopened, battery was  externalized.  The extensions were disconnected from the depleted  battery, connected to the new battery, locked down properly,  circularized and inserted with the new battery into the subcutaneous  pocket.  Wound was irrigated.  The subcutaneous tissues were  approximated with 2-0 Vicryl inverted sutures and skin edges were  approximated with 3-0 Vicryl subcuticular stitch.  The wound was dressed  with Benzoin, Steri-Strips, Telfa gauze, and tape.  The patient was  taken to the recovery in stable and satisfactory condition having  tolerated his  operation well.  Counts correct at the end of the case.      Danae Orleans. Venetia Maxon, M.D.  Electronically Signed     JDS/MEDQ  D:  06/26/2008  T:  06/27/2008  Job:  [id number]

## 2010-05-24 NOTE — Assessment & Plan Note (Signed)
Continue statin. Lipids and liver monitored by primary care. 

## 2010-05-24 NOTE — Progress Notes (Signed)
HPI: Pleasant male with past medical history of coronary artery disease status post bypass and graft, Parkinson's, diabetes and hypertension as well as hyperlipidemia. His last Myoview was performed on May 10, 2007. At that time ejection fraction was 39%. There was a prior anterior and apical infarct but no ischemia. Carotid Dopplers in January 2005 showed 0-39% stenosis. Echocardiogram in 2005 showed grossly normal LV function but technically difficult images. I last saw the patient in May of 2011. Since then he does have dyspnea on exertion. He had pain in his right elbow approximately one week ago. He has not had exertional chest pain or syncope.  Current Outpatient Prescriptions  Medication Sig Dispense Refill  . aspirin 81 MG tablet Take 81 mg by mouth daily.        . furosemide (LASIX) 80 MG tablet TAKE 1 TABLET BY MOUTH EVERY MORNING AND 1/2 TABLETS BY MOUTH EVERY EVENING  45 tablet  1  . glimepiride (AMARYL) 2 MG tablet 1 BY MOUTH ONCE DAILY, ADDITIONAL REFILLS REQUIRE APPOINTMENT  30 tablet  0  . losartan (COZAAR) 50 MG tablet Take 50 mg by mouth daily.        . methimazole (TAPAZOLE) 10 MG tablet Take 10 mg by mouth daily.        . metoprolol (TOPROL-XL) 50 MG 24 hr tablet Take 50 mg by mouth daily.        . potassium chloride (KLOR-CON) 10 MEQ CR tablet Take 30 mEq by mouth daily.        . pramipexole (MIRAPEX) 0.5 MG tablet Take 0.5 mg by mouth 3 (three) times daily.        . sitaGLIPtan-metformin (JANUMET) 50-1000 MG per tablet Take 1 tablet by mouth 2 (two) times daily with a meal.        . traMADol (ULTRAM) 50 MG tablet Take 50 mg by mouth every 6 (six) hours as needed.        . travoprost, benzalkonium, (TRAVATAN) 0.004 % ophthalmic solution Place 1 drop into the right eye at bedtime.        . trihexyphenidyl (ARTANE) 2 MG tablet Take 2 mg by mouth 3 (three) times daily with meals.           Past Medical History  Diagnosis Date  . Cardiomyopathy   . CAD (coronary artery disease)    . HTN (hypertension)   . MI (myocardial infarction)   . Sleep apnea   . GERD (gastroesophageal reflux disease)   . Hyperthyroidism   . DM (diabetes mellitus)   . Hyperplasia, prostate   . Benign neoplasm of colon   . Nephrolithiasis   . Ventral hernia   . HLD (hyperlipidemia)   . Parkinson disease     Past Surgical History  Procedure Date  . Coronary artery bypass graft 2000    Tressie Stalker, MD  . Coronary stent placement 1998  . Median sternotomy 2000    History   Social History  . Marital Status: Married    Spouse Name: N/A    Number of Children: N/A  . Years of Education: N/A   Occupational History  . Not on file.   Social History Main Topics  . Smoking status: Never Smoker   . Smokeless tobacco: Never Used  . Alcohol Use: No  . Drug Use: No  . Sexually Active: Not on file   Other Topics Concern  . Not on file   Social History Narrative  . No narrative on file  ROS: no fevers or chills, productive cough, hemoptysis, dysphasia, odynophagia, melena, hematochezia, dysuria, hematuria, rash, seizure activity, orthopnea, PND, pedal edema, claudication. Remaining systems are negative.  Physical Exam: Well-developed well-nourished in no acute distress.  Skin is warm and dry.  HEENT is normal.  Neck is supple. No thyromegaly.  Chest is clear to auscultation with normal expansion.  Cardiovascular exam is regular rate and rhythm.  Abdominal exam nontender or distended. No masses palpated. Extremities show no edema. neuro resting tremor from Parkinson's.  ECG Normal sinus rhythm at a rate of 75. Cannot rule out prior anterior infarct. Lateral T-wave inversion.

## 2010-05-24 NOTE — Patient Instructions (Signed)
Your physician wants you to follow-up in: ONE YEAR You will receive a reminder letter in the mail two months in advance. If you don't receive a letter, please call our office to schedule the follow-up appointment.   Your physician has requested that you have a lexiscan myoview. For further information please visit [email or web address] Please follow instruction sheet, as given.   Your physician recommends that you return for lab work in: TODAY

## 2010-05-24 NOTE — Assessment & Plan Note (Signed)
Continue aspirin and statin. Schedule Myoview. 

## 2010-05-26 ENCOUNTER — Other Ambulatory Visit: Payer: Self-pay

## 2010-05-26 MED ORDER — GLIMEPIRIDE 2 MG PO TABS: 2.0000 mg | ORAL_TABLET | Freq: Every day | ORAL | Status: DC

## 2010-05-26 MED ORDER — METHIMAZOLE 10 MG PO TABS: 10.0000 mg | ORAL_TABLET | Freq: Every day | ORAL | Status: DC

## 2010-05-26 NOTE — Telephone Encounter (Signed)
2)  Please schedule a follow-up appointment in 3 months. 3)  Lipid Panel prior to visit, ICD-9: 4)  HbgA1C prior to visit, ICD-9:  Patient was due for appointment 02/2010

## 2010-05-27 ENCOUNTER — Other Ambulatory Visit: Payer: Self-pay | Admitting: *Deleted

## 2010-05-27 MED ORDER — LOSARTAN POTASSIUM 50 MG PO TABS: 50.0000 mg | ORAL_TABLET | Freq: Every day | ORAL | Status: DC

## 2010-05-27 NOTE — Op Note (Signed)
NAME:  Henry [name], Henry [name]                        ACCOUNT NO.:  [id number]   MEDICAL RECORD NO.:  [id number]                   PATIENT TYPE:  INP   LOCATION:  3023                                 FACILITY:  [facility]   PHYSICIAN:  Danae Orleans. Venetia Maxon, M.D.               DATE OF BIRTH:  31-Jan-1950   DATE OF PROCEDURE:  07/28/2003  DATE OF DISCHARGE:                                 OPERATIVE REPORT   PREOPERATIVE DIAGNOSIS:  Parkinson's disease status post bilateral STN deep  brain stimulator placement.   POSTOPERATIVE DIAGNOSIS:  Parkinson's disease status post bilateral STN deep  brain stimulator placement.   PROCEDURE:  Second stage of deep brain stimulator placement with placement  of implantable pulse generator.   SURGEON:  Danae Orleans. Venetia Maxon, M.D.   ANESTHESIA:  General endotracheal anesthesia.   ESTIMATED BLOOD LOSS:  Minimal.   COMPLICATIONS:  None.   DISPOSITION:  Recovery room.   INDICATIONS FOR PROCEDURE:  Henry [name] is a 60 year old man with severe  medically refractory Parkinson's disease with significant tremor.  He had  previously undergone placement of bilateral deep brain stimulators in  subthalamic nucleus and this is the second stage of the surgical procedure  to place an implantable pulse generator.   PROCEDURE:  Mr. Trapp was brought to the operating room.  Following  satisfactory and uncomplicated induction of general endotracheal anesthesia  and insertion of intravenous  lines, the patient was placed in a supine  position on the operating table.  His head was turned slightly to the right.  He had a shoulder roll placed on the right.  His right frontal scalp and  upper postauricular region and upper chest were then shaved, prepped and  draped in the usual sterile fashion.  The area of planned incision was  infiltrated with 0.25% Marcaine and 0.5% lidocaine with 1:200,000  epinephrine.  Initially, the right cranial incision was reopened and deep  brain  stimulator leads were very carefully identified and removed from the  investing soft tissue.  The connectors were then removed.  Subsequently, a  subcutaneous pocket was placed over the right chest just distal to the  clavicle and tunneled in the subcutaneous tissues.  Subsequently, a  postauricular incision was then made and using the tunneler, the tunneler  was placed from the postauricular incision to the chest and then the 51 cm  electrical extensions were brought up from the chest to this site.  The soft  tissues in the investing posterior neck and connective tissue was extremely  tough.  Subsequently, a separate tunnel was then made from the right sided  cranial incision to the postauricular incision and then the electrodes were  brought up to the cranial site.  The sterile connectors with the Silastic  sheaths were placed and care was taken to make sure that the connections  were dry and all four connections were locked with a torque wrench.  2-0  silk ties were placed overlying these connections.  Subsequently, the  extensions were then hooked to the connector battery and locked  appropriately and then redundant extension wire was coiled under the battery  and the battery was placed in the subcutaneous pocket.  The chest incision  was closed with interrupted 2-0 Vicryl sutures and interrupted 3-0 Vicryl  subcuticular stitch.  The postauricular incision was closed with running and  simple nylon stitches.  The cranial incision was closed with interrupted  Vicryl sutures and a running 3-0 nylon locked stitch.  The wounds were  dressed with sterile occlusive dressings.  The patient was extubated in the  operating room and taken to th emergency room  in stable, satisfactory  condition, having tolerated this operation well.  Counts were correct at the  end of the case.                                               Danae Orleans. Venetia Maxon, M.D.    JDS/MEDQ  D:  07/28/2003  T:  07/28/2003   Job:  [id number]

## 2010-05-27 NOTE — Assessment & Plan Note (Signed)
[facility]                              CARDIOLOGY OFFICE NOTE   Henry [name], Henry [name]                     MRN:          [id number]  DATE:11/01/2005                            DOB:          09/09/1950    [name] is a very pleasant gentleman who has a history of coronary  disease, status post coronary artery bypassing graft, ischemic  cardiomyopathy, hyperlipidemia, hypertension, hyperthyroidism, Parkinson's,  and diabetes.  Since I last saw him he is doing reasonably well.  He does  have dyspnea on exertion, which is a chronic issue.  He also has some chest  tightness while walking up hills that resolves with rest.  However, this has  been present since his bypass surgery.  It is unchanged.  There is no chest  tightness at rest.  There is no pedal edema.   His medications include:  1. Aspirin 81 mg p.o. daily.  2. Antioxidant vitamin.  3. __________ 2 mg p.o. t.i.d.  4. Potassium 20 mEq p.o. b.i.d.  5. Amaryl 2 mg p.o. daily.  6. Cozaar 50 mg p.o. daily.  7. Metoprolol 25 mg p.o. b.i.d.  8. Tricor 145 mg p.o. daily.  9. Methimazole 10 mg p.o. daily.  10.Vytorin 10/40 mg q.h.s.  11.Mirapex.  12.Lasix 80 mg in the morning and 40 mg in the evening.  13.Metformin.   PHYSICAL EXAMINATION:  VITAL SIGNS:  His physical exam today shows a blood  pressure of 122/74, and his pulse is 65.  NECK:  Supple with no bruits.  CHEST:  Clear.  CARDIOVASCULAR:  Regular rate and rhythm.  EXTREMITIES:  No edema.   His electrocardiogram shows a sinus rhythm at a rate of 65.  There is  anterolateral T wave inversion.   DIAGNOSES:  1. Coronary artery disease, status post coronary artery bypassing graft.  2. Ischemic cardiomyopathy.  3. Parkinson's.  4. History of hyperthyroidism.  5. Diabetes mellitus.  6. Hypertension.  7. Hypokalemia.  8. Sleep apnea.  9. Gastroesophageal reflux disease.   PLAN:  [name] is doing reasonably well from a  symptomatic standpoint.  He is complaining of chest tightness with exertion at times.  However, this  appears to be present since his coronary artery bypassing graft.  We will  plan to risk-stratify with a stress Myoview.  If it shows no significant  ischemia, then I think we should continue with medical therapy.  His blood  pressure is well-controlled today.  He is scheduled to have lipids and  liver, and we will review those.  Our goal LDL should be less than 70 given  his history of coronary disease.  He will see [place] back in 9 months and  otherwise continue with exercise and diet.    ______________________________  Madolyn Frieze. Jens Som, MD, [facility]    BSC/MedQ  DD: 11/01/2005  DT: 11/02/2005  Job #: [id number]

## 2010-05-27 NOTE — Discharge Summary (Signed)
NAME:  Henry [name], Henry [name]                        ACCOUNT NO.:  [id number]   MEDICAL RECORD NO.:  [id number]                   PATIENT TYPE:  INP   LOCATION:  3023                                 FACILITY:  [facility]   PHYSICIAN:  Danae Orleans. Venetia Maxon, M.D.               DATE OF BIRTH:  30-Jul-1950   DATE OF ADMISSION:  07/28/2003  DATE OF DISCHARGE:  07/29/2003                                 DISCHARGE SUMMARY   REASON FOR ADMISSION:  1. Parkinson's disease.  2. History of congestive heart failure.  3. Coronary atherosclerosis.  4. Prior aortocoronary bypass.  5. Hypertension.  6. Type 2 diabetes.  7. Morbid obesity.   HISTORY:  The patient was admitted for second stage of a planned two-stage  operative procedure with placement of an implantable pulse generator for  bilateral STN deep brain stimulating electrodes.   HOSPITAL COURSE:  The patient was admitted on the same day as procedure  basis, and underwent uncomplicated placement of implantable pulse generator  on the right supraclavicular region.  He did well.  He was doing well on the  20th and was discharged home on Percocet and ciprofloxacin with instructions  to follow up in 10 days for recheck.                                                Danae Orleans. Venetia Maxon, M.D.    JDS/MEDQ  D:  09/04/2003  T:  09/05/2003  Job:  [id number]

## 2010-05-27 NOTE — Op Note (Signed)
NAME:  Henry [name], Henry [name]                        ACCOUNT NO.:  [id number]   MEDICAL RECORD NO.:  [id number]                   PATIENT TYPE:  INP   LOCATION:  3104                                 FACILITY:  [facility]   PHYSICIAN:  Danae Orleans. Venetia Maxon, M.D.               DATE OF BIRTH:  09/15/1950   DATE OF PROCEDURE:  06/30/2003  DATE OF DISCHARGE:  07/01/2003                                 OPERATIVE REPORT   PREOPERATIVE DIAGNOSIS:  Parkinson's disease with bilateral upper extremity  tremor.   POSTOPERATIVE DIAGNOSIS:  Parkinson's disease with bilateral upper extremity  tremor.   PROCEDURE:  Leksell stereotactic stealth-guided deep brain stimulator  electrode placement and bilateral subthalamic nuclei with microelectric  recording.   SURGEON:  Danae Orleans. Venetia Maxon, M.D.   ANESTHESIA:  Mask with local lidocaine.   COMPLICATIONS:  None.   DISPOSITION:  Recovery.   INDICATIONS:  Henry [name] is a 60 year old man with Parkinson's disease.  He is a Product/process development scientist.  He has significant disabling tremor.  It was  elected to take him to surgery for placement of deep brain stimulating  electrodes.   PROCEDURE:  Henry [name] was brought to the operating room.  He was placed in  the [place]ell head frame and according to standard procedure and utilizing  local lidocaine and Betadine prep.  He was taken for a CT scan in the frame  and then this scan was merged with the previously acquired stealth study  with an MRI of the brain.  Coordinates for subthalamic nuclei were computed  using AC-PC line and 12 mm off the midline 3 mm posterior to the midpoint  and 5 mm deep to the AC-PC line and trajectories were then computed for both  sides to skirt the frontal horn of the lateral ventricle just anterior to  the coronal suture approximately 3.5 cm off the midline.  Subsequently the  patient was then brought into the operating room and he was placed supine on  the operating table.  His neck was maintained in  neutral alignment and the  stereotactic frame was affixed to the [place] headholder.  His bifrontal  scalp was then shaved, prepped and draped in the usual sterile fashion.  The  area of planned incision was infiltrated with 0.25% Marcaine, 0.5% lidocaine  with 1:200,000 epinephrine.  Two curvilinear incisions were made centered on  the midline on either side approximately 3.5 cm off the midline and just  overlying the coronal suture.  The 14 mm perforator was then used initially  on the right and subsequently on the left after using the planned trajectory  for DBS implant to make sure that the bur hole was then appropriately  placed.   The dura was then cauterized with bipolar cautery and then opened in a  cruciate fashion after placing the Navigus cap, which was affixed to the  skull using two small self-tapping screws.  Initially on  the right side the  microelectric recordings were performed with good recordings from STN nuclei  approximately 2 mm above target to 4 mm to 5 mm below target.  A stimulating  electrode was then positioned and with electrical stimulation the patient  had complete cessation of his tremor with no ill effects at fairly low  voltage.  The remainder of the Navigus cap was then placed and using  fluoroscopy to confirm that the electrode was not moved, the electrode was  locked into position and the cap was placed.  The remaining wire was  circularized within the incision.   Attention was then turned to the left, where similar procedure was  performed.  At this point there was stimulation in the STN from 3 above  target to 3 below target, and electrode was placed into this position.  The  patient again had good stimulation at fairly low voltages.  The deeper  electrode caused a feeling that he felt ill at ease, but the upper  electrodes did not cause any feeling that he was ill at ease and he had some  brief transient tingling in the upper extremity.   Again  the Navigus cap was placed and the redundant electrode circularized  after it was made sure under fluoroscopy that the electrode had not  migrated.  The left-sided electrode was taken over to the right incision and  all incisions were then closed with 2-0 Vicryl and 3-0 nylon stitches.  The  wounds were dressed with a sterile occlusive dressing.  The patient was  taken out of the stereotactic frame.  He did well throughout the surgery.  He did not have any ill effects and was alert and oriented and fully  appropriate throughout this surgery.                                               Danae Orleans. Venetia Maxon, M.D.    JDS/MEDQ  D:  06/30/2003  T:  07/01/2003  Job:  [id number]

## 2010-05-27 NOTE — Assessment & Plan Note (Signed)
[facility]                        [facility] OFFICE NOTE   Henry [name], Henry [name]                       MRN:          [id number]  DATE:12/11/2005                            DOB:          Jul 13, 1950    Henry [name] called December 11, 2005, requesting referral to a back doctor.   He was most recently seen October 9th for follow up of his diabetes and  metabolic syndrome.  Specific goals were discussed with him in reference  to waist measurement, exercise and water intake as well as fasting blood  sugar and post prandial glucoses.  Restriction of white carbohydrates  and abstinence from high fructose corn syrup was discussed.  Follow up  lipids in early March of 2008 was recommended.  He previously was on  Avandia but stopped it based on the t.v. reports.  Because of his  prior history of angioplasty in 2000 and MI in 1996, he would NOT be a  candidate for Avandia based on the recent study.   He was to continue metformin 1000 mg twice a day along with the  lifestyle recommendations.  In reviewing the chart, there is no mention  of any back problems.  He does have Parkinson's and this would be the  most likely explanation for low back syndrome.  Physical  Therapy/Occupational Therapy consult will be pursued withimaging or  specialist referral based on their findings & recommendations.     Henry [name]. Henry Ren, MD,FACP,FCCP  Electronically Signed    [facility]/MedQ  DD: 12/14/2005  DT: 12/14/2005  Job #: [id number]

## 2010-06-02 ENCOUNTER — Other Ambulatory Visit (HOSPITAL_COMMUNITY): Payer: Medicare Other | Admitting: Radiology

## 2010-06-02 ENCOUNTER — Ambulatory Visit (HOSPITAL_COMMUNITY): Payer: Federal, State, Local not specified - PPO | Admitting: Radiology

## 2010-06-02 DIAGNOSIS — R0989 Other specified symptoms and signs involving the circulatory and respiratory systems: Secondary | ICD-10-CM

## 2010-06-02 DIAGNOSIS — F40240 Claustrophobia: Secondary | ICD-10-CM

## 2010-06-02 DIAGNOSIS — I2510 Atherosclerotic heart disease of native coronary artery without angina pectoris: Secondary | ICD-10-CM

## 2010-06-02 MED ORDER — TECHNETIUM TC 99M TETROFOSMIN IV KIT
11.0000 | PACK | Freq: Once | INTRAVENOUS | Status: AC | PRN
  Administered 2010-06-02: 11 via INTRAVENOUS

## 2010-06-02 MED ORDER — DIAZEPAM 5 MG PO TABS
5.0000 mg | ORAL_TABLET | Freq: Once | ORAL | Status: AC
  Administered 2010-06-02: 5 mg via ORAL

## 2010-06-02 NOTE — Progress Notes (Deleted)
[facility] SITE 3 NUCLEAR MED [address] [place] [address] [phone]  Cardiology Nuclear Med Study  Henry [name] is a 60 y.o. male [id number] [birth year]   Nuclear Med Background Indication for Stress Test:  Evaluation for Ischemia, Graft Patency and Stent Patency History:  {CHL HISTORY STRESS TEST:21021012} Cardiac Risk Factors: Hypertension, Lipids and NIDDM  Symptoms:  {CHL SYMPTOMS STRESS VHQI:69629528}   Nuclear Pre-Procedure Caffeine/Decaff Intake:  None NPO After: 8:00pm   Lungs:  *** IV 0.9% NS with Angio Cath:  20g  IV Site: R Antecubital  IV Started by:  Irean Hong, RN  Chest Size (in):  56 Cup Size: n/a  Height: 5\' 8"  (1.727 m)  Weight:  280 lb (127.007 kg)  BMI:  Body mass index is 42.57 kg/(m^2). Tech Comments:  Held toprol, and lasix this am    Nuclear Med Study 1 or 2 day study: {CHL 1 OR 2 DAY STUDY:21021019}  Stress Test Type:  {CHL STRESS TEST TYPE:21021018}  Reading MD: {CHL LB NUC READING UX:32440102}  Order Authorizing Provider:  ***  Resting Radionuclide: {CHL RESTING RADIONUCLIDE:21021021}  Resting Radionuclide Dose: *** mCi   Stress Radionuclide:  {CHL STRESS RADIONUCLIDE:21021022}  Stress Radionuclide Dose: *** mCi           Stress Protocol Rest HR: *** Stress HR: ***  Rest BP: *** Stress BP: ***  Exercise Time (min): {NA AND WILDCARD:21589} METS: {NA AND WILDCARD:21589}          Dose of Adenosine (mg):  {NA AND VOZDGUYQ:03474} Dose of Lexiscan: {CHL CARD WILDCARD AND 0.4:21590} mg  Dose of Atropine (mg): {NA AND QVZDGLOV:56433} Dose of Dobutamine: {NA AND WILDCARD:21589} mcg/kg/min (at max HR)  Stress Test Technologist: {CHL LB STRESS TEST TECHNOLOGIST:21021024}  Nuclear Technologist:  {CHL LB NUCLEAR TECHNOLOGIST:21021025}     Rest Procedure:  {CHL REST PROCEDURE NUCLEAR:21021027} Rest ECG: {CHL REST IRJ:18841}  Stress Procedure:  {CHL STRESS PROCEDURE NUCLEAR:21021028} Stress ECG: {CHL CAR STRESS  ECG:21561}  QPS Raw Data Images:  {CHL RAW DATA IMAGES NUC:21021029} Stress Images:  {CHL STRESS IMAGES NUC:21021030} Rest Images:  {CHL REST IMAGES NUC:21021031} Subtraction (SDS):  {CHL SUBTRACTION (SDS) NUC:21021032} Transient Ischemic Dilatation (Normal <1.22):  *** Lung/Heart Ratio (Normal <0.45):  ***  Quantitative Gated Spect Images QGS EDV:  *** ml QGS ESV:  *** ml QGS cine images:  {CHL CARD QGS:21591:o} QGS EF: {CHL CARD STUDY NOT GATED:21592:o}  Impression Exercise Capacity:  {CHL EXERCISE CAPACITY NUC:21021037} BP Response:  {CHL BP RESPONSE NUC:21021038} Clinical Symptoms:  {CHL CLINICAL SYMPTOMS NUC:21021039} ECG Impression:  {CHL ECG IMPRESSION NUC:21021040} Comparison with Prior Nuclear Study: {CHL NUCLEAR STUDY COMPARISON:21562}  Overall Impression:  {CHL OVERALL IMPRESSION NUC:21021041}

## 2010-06-02 NOTE — Progress Notes (Signed)
The patient here for a lexiscan myoview. IV 20G angiocath (R) AC by Cleon Gustin, and TC-Myoview 11 mci injected by Nuclear Tech.  When the patient was placed under the camera for the rest images,he became very claustrophobic. Diazepam 5mg  po given per orders by Dr, Olga Millers. After the patient felt sedated, he was placed under the camera, but the patient was unable to tolerate it. The test was cancelled per Dr. Jens Som. Carma Dwiggins,RN.

## 2010-06-03 ENCOUNTER — Other Ambulatory Visit: Payer: Self-pay | Admitting: Internal Medicine

## 2010-06-07 ENCOUNTER — Ambulatory Visit: Payer: Medicare Other

## 2010-06-07 DIAGNOSIS — E785 Hyperlipidemia, unspecified: Secondary | ICD-10-CM

## 2010-06-07 DIAGNOSIS — IMO0001 Reserved for inherently not codable concepts without codable children: Secondary | ICD-10-CM

## 2010-06-07 LAB — LIPID PANEL
HDL: 38.9 mg/dL — ABNORMAL LOW (ref >39.00)
Total CHOL/HDL Ratio: 3
VLDL: 43 mg/dL — ABNORMAL HIGH (ref 0.0–40.0)

## 2010-06-07 LAB — LDL CHOLESTEROL, DIRECT: Direct LDL: 69.3 mg/dL

## 2010-06-07 LAB — HEMOGLOBIN A1C: Hgb A1c MFr Bld: 8 % — ABNORMAL HIGH (ref 4.6–6.5)

## 2010-06-10 ENCOUNTER — Ambulatory Visit (INDEPENDENT_AMBULATORY_CARE_PROVIDER_SITE_OTHER): Payer: Federal, State, Local not specified - PPO | Admitting: Internal Medicine

## 2010-06-10 ENCOUNTER — Encounter: Payer: Self-pay | Admitting: Internal Medicine

## 2010-06-10 DIAGNOSIS — E785 Hyperlipidemia, unspecified: Secondary | ICD-10-CM

## 2010-06-10 DIAGNOSIS — I10 Essential (primary) hypertension: Secondary | ICD-10-CM

## 2010-06-10 DIAGNOSIS — IMO0001 Reserved for inherently not codable concepts without codable children: Secondary | ICD-10-CM

## 2010-06-10 MED ORDER — GLIMEPIRIDE 2 MG PO TABS: 2.0000 mg | ORAL_TABLET | Freq: Every day | ORAL | Status: DC

## 2010-06-10 NOTE — Progress Notes (Signed)
  Subjective:    Patient ID: Henry [name], male    DOB: 1950/06/25, 60 y.o.   MRN: [id number]  HPI #!  HYPERTENSION Disease Monitoring: Blood pressure range-not checked Chest pain, palpitations- no       Dyspnea- yes when supine ; this precluded Rest Stress test by Dr Jens Som recently Medications: Compliance- yes Lightheadedness,Syncope- no   Edema- no  #2 DIABETES Disease Monitoring: Blood Sugar -FBS average 160-180   Polyuria/phagia/dipsia- no      Visual problems- no Medications: Compliance- yes Hypoglycemic symptoms- no A1c 8%( average sugar 183, risk increased 60%)  #3 HYPERLIPIDEMIA Disease Monitoring: See symptoms for Hypertension Medications: Compliance- states on cholesterol med (not on list)  Abd pain, bowel changes- constipation (BM every 3 days)   Muscle aches- no LDL @ goal of <70; HDL 39; TG now 215, up from 185(low 84 in 2010) I. exercise Exercise: No Diet: low fat ROS See HPI above PMH Smoking Status noted :never       Review of Systems Pain base R great toe with tight shoes; no PMH of gout     Objective:   Physical Exam Gen.: Healthy but overweight; well-nourished in appearance. Alert, appropriate and cooperative throughout exam. Eyes: No corneal or conjunctival inflammation noted.  Neck: No deformities, masses, or tenderness noted. Thyroid  normal. Lungs: Normal respiratory effort; chest expands symmetrically. Lungs are clear to auscultation without rales, wheezes, or increased work of breathing. Heart: Normal rate and rhythm. Normal S1 and S2. No gallop, click, or rub. S4 with slurring; no  murmur. Abdomen: Bowel sounds normal; abdomen soft and nontender. No masses, organomegaly or hernias noted. Protuberant. No AAA or renal artery bruits Musculoskeletal/extremities: No deformity or scoliosis noted of  the thoracic or lumbar spine. No clubbing, cyanosis noted. Bunion base R great toe. Nail health  Good. Trace edema. Amputation R index finger; scars 3rd  R & L index   fingernail Vascular: Carotid, radial artery, dorsalis pedis and dorsalis posterior tibial pulses are full and equal. No bruits present. Neurologic: Alert and oriented x3. Deep tendon reflexes symmetrical and normal.  Parkinsonian facies ; tremor  of hands.      Skin: Intact without suspicious lesions or rashes. Lymph: No cervical, axillary, or inguinal lymphadenopathy present. Psych: Mood and affect are normal. Normally interactive                                                                                         Assessment & Plan:  #1 diabetes mellitus suboptimal control  #2 dyslipidemia; elevated triglycerides suggest increased intake of high fructose corn syrup sugar  #3 hypertension controlled  #4 toe pain; increase this exam with increased triglycerides

## 2010-06-10 NOTE — Patient Instructions (Signed)
The most common cause of elevated triglycerides is the ingestion of sugar from high fructose corn syrup sources. You should consume less than 40 grams  of sugar per day from foods and drinks with high fructose corn syrup as number 2, 3, or #4 on the label. As TG go up, HDL or good cholesterol goes down. Also uric acid which causes gout will go up.    Check fasting lipids, BUN ,creat , K+, A1c, uric acid  in 10 weeks after changing diet.(250.02, 274.9,277.7)

## 2010-06-22 ENCOUNTER — Other Ambulatory Visit: Payer: Self-pay | Admitting: Internal Medicine

## 2010-06-29 ENCOUNTER — Other Ambulatory Visit: Payer: Self-pay | Admitting: Internal Medicine

## 2010-07-10 ENCOUNTER — Other Ambulatory Visit: Payer: Self-pay | Admitting: Internal Medicine

## 2010-08-18 ENCOUNTER — Other Ambulatory Visit: Payer: Self-pay | Admitting: *Deleted

## 2010-08-18 DIAGNOSIS — E8881 Metabolic syndrome: Secondary | ICD-10-CM

## 2010-08-18 DIAGNOSIS — M109 Gout, unspecified: Secondary | ICD-10-CM

## 2010-08-18 DIAGNOSIS — IMO0001 Reserved for inherently not codable concepts without codable children: Secondary | ICD-10-CM

## 2010-08-19 ENCOUNTER — Other Ambulatory Visit (INDEPENDENT_AMBULATORY_CARE_PROVIDER_SITE_OTHER): Payer: Federal, State, Local not specified - PPO

## 2010-08-19 DIAGNOSIS — IMO0001 Reserved for inherently not codable concepts without codable children: Secondary | ICD-10-CM

## 2010-08-19 DIAGNOSIS — E8881 Metabolic syndrome: Secondary | ICD-10-CM

## 2010-08-19 DIAGNOSIS — M109 Gout, unspecified: Secondary | ICD-10-CM

## 2010-08-19 LAB — URIC ACID: Uric Acid, Serum: 8 mg/dL — ABNORMAL HIGH (ref 4.0–7.8)

## 2010-08-19 LAB — BUN: BUN: 16 mg/dL (ref 6–23)

## 2010-08-19 LAB — LIPID PANEL: VLDL: 35.6 mg/dL (ref 0.0–40.0)

## 2010-08-19 LAB — CREATININE, SERUM: Creatinine, Ser: 0.9 mg/dL (ref 0.4–1.5)

## 2010-08-19 LAB — POTASSIUM: Potassium: 4.2 mEq/L (ref 3.5–5.1)

## 2010-08-19 NOTE — Progress Notes (Signed)
Labs only

## 2010-08-23 ENCOUNTER — Other Ambulatory Visit: Payer: Self-pay | Admitting: Internal Medicine

## 2010-08-23 MED ORDER — FUROSEMIDE 80 MG PO TABS: 80.0000 mg | ORAL_TABLET | ORAL | Status: DC

## 2010-08-23 NOTE — Telephone Encounter (Signed)
RX sent to pharmacy  

## 2010-09-16 ENCOUNTER — Other Ambulatory Visit: Payer: Self-pay | Admitting: Internal Medicine

## 2010-09-16 MED ORDER — POTASSIUM CHLORIDE CRYS ER 10 MEQ PO TBCR: EXTENDED_RELEASE_TABLET | ORAL | Status: DC

## 2010-09-16 NOTE — Telephone Encounter (Signed)
RX sent

## 2010-10-11 ENCOUNTER — Other Ambulatory Visit: Payer: Self-pay | Admitting: Internal Medicine

## 2010-10-11 MED ORDER — GLIMEPIRIDE 2 MG PO TABS: 2.0000 mg | ORAL_TABLET | Freq: Every day | ORAL | Status: DC

## 2010-10-11 NOTE — Telephone Encounter (Signed)
RX sent

## 2010-10-25 ENCOUNTER — Other Ambulatory Visit: Payer: Self-pay | Admitting: Internal Medicine

## 2010-10-25 ENCOUNTER — Other Ambulatory Visit: Payer: Self-pay

## 2010-10-25 MED ORDER — METOPROLOL SUCCINATE ER 50 MG PO TB24: 50.0000 mg | ORAL_TABLET | Freq: Every day | ORAL | Status: DC

## 2010-10-25 NOTE — Telephone Encounter (Signed)
.   Requested Prescriptions   Signed Prescriptions Disp Refills  . metoprolol (TOPROL-XL) 50 MG 24 hr tablet 30 tablet 11    Sig: Take 1 tablet (50 mg total) by mouth daily.    Authorizing Provider: Lewayne Bunting    Ordering User: Lacie Scotts

## 2010-10-27 LAB — URINALYSIS, ROUTINE W REFLEX MICROSCOPIC
Bilirubin Urine: NEGATIVE
Glucose, UA: NEGATIVE
Ketones, ur: NEGATIVE
Leukocytes, UA: NEGATIVE
Nitrite: NEGATIVE
Protein, ur: 30 — AB
Specific Gravity, Urine: 1.026
Urobilinogen, UA: 1
pH: 6

## 2010-10-27 LAB — URINE MICROSCOPIC-ADD ON

## 2010-10-30 ENCOUNTER — Other Ambulatory Visit: Payer: Self-pay | Admitting: Internal Medicine

## 2010-11-03 ENCOUNTER — Ambulatory Visit (INDEPENDENT_AMBULATORY_CARE_PROVIDER_SITE_OTHER): Payer: Federal, State, Local not specified - PPO | Admitting: Internal Medicine

## 2010-11-03 ENCOUNTER — Encounter: Payer: Self-pay | Admitting: Internal Medicine

## 2010-11-03 VITALS — BP 130/78 | HR 81 | Temp 98.1°F | Wt 276.0 lb

## 2010-11-03 DIAGNOSIS — R058 Other specified cough: Secondary | ICD-10-CM

## 2010-11-03 DIAGNOSIS — R05 Cough: Secondary | ICD-10-CM

## 2010-11-03 DIAGNOSIS — R059 Cough, unspecified: Secondary | ICD-10-CM

## 2010-11-03 MED ORDER — HYDROCODONE-HOMATROPINE 5-1.5 MG/5ML PO SYRP: 5.0000 mL | ORAL_SOLUTION | Freq: Four times a day (QID) | ORAL | Status: AC | PRN

## 2010-11-03 MED ORDER — MOMETASONE FUROATE 220 MCG/INH IN AEPB: 1.0000 | INHALATION_SPRAY | Freq: Two times a day (BID) | RESPIRATORY_TRACT | Status: DC

## 2010-11-03 NOTE — Progress Notes (Signed)
  Subjective:    Patient ID: Henry [name], male    DOB: Jun 23, 1950, 60 y.o.   MRN: [id number]  HPI Cough Onset:as dry cough 10/23 after eating honey. "I have a sensitive spot in throat since heart surgery" Extrinsic symptoms:itchy eyes, sneezing:no  Infectious symptoms :fever, purulent secretions :no Chest symptoms: pain, sputum production, hemoptysis,dyspnea:no, but  wheezing eve of 10/23 when supine GI symptoms: Dyspepsia, reflux:no Occupational/environmental exposures:no Smoking:never ACE inhibitor:no , on ARB Treatment/efficacy:no PMH: no asthma  Review of Systems     Objective:   Physical Exam General appearance is of good health and nourishment; no acute distress or increased work of breathing is present.  No  lymphadenopathy about the head, neck, or axilla noted.   Eyes: No conjunctival inflammation or lid edema is present.   Ears:  External ear exam shows no significant lesions or deformities.  Otoscopic examination reveals clear canals, tympanic membranes are intact bilaterally without bulging, retraction, inflammation or discharge. Aid worn on L  Nose:  External nasal examination shows no deformity or inflammation. Nasal mucosa are pink and moist without lesions or exudates. No septal dislocation .No obstruction to airflow.   Oral exam: Dental hygiene is good; lips and gums are healthy appearing.There is no oropharyngeal erythema or exudate noted.  Hoarse  Neck:  No deformities, thyromegaly, or masses. Slight  tenderness noted under R mandible.   Deep brain stimulator on R .  Heart:  Normal rate and regular rhythm. S1 and S2 normal without gallop, murmur, click, rub or other extra sounds.   Lungs:Chest clear to auscultation; no wheezes, rhonchi,rales ,or rubs present.No increased work of breathing.    Extremities:  No cyanosis, edema, or clubbing  noted    Skin: Warm & dry          Assessment & Plan:  #1 cough ; post irritation from honey. Possible early viral  process Plan: See orders and recommendations

## 2010-11-03 NOTE — Patient Instructions (Signed)
Asmanex one inhalation every 12 hours; gargle and spit after use. Zicam Melts or Zinc lozenges ; vitamin C 2000 mg daily; & Echinacea for 4-7 days. Report fever, exudate("pus") or progressive pain.

## 2010-11-09 ENCOUNTER — Ambulatory Visit (INDEPENDENT_AMBULATORY_CARE_PROVIDER_SITE_OTHER)
Admission: RE | Admit: 2010-11-09 | Discharge: 2010-11-09 | Disposition: A | Discharge status: Home / Self Care | Payer: Federal, State, Local not specified - PPO | Source: Ambulatory Visit | Attending: Family Medicine | Admitting: Family Medicine

## 2010-11-09 ENCOUNTER — Ambulatory Visit (INDEPENDENT_AMBULATORY_CARE_PROVIDER_SITE_OTHER): Payer: Federal, State, Local not specified - PPO | Admitting: Family Medicine

## 2010-11-09 ENCOUNTER — Encounter: Payer: Self-pay | Admitting: Family Medicine

## 2010-11-09 VITALS — BP 126/84 | HR 77 | Temp 97.7°F | Wt 286.2 lb

## 2010-11-09 DIAGNOSIS — J40 Bronchitis, not specified as acute or chronic: Secondary | ICD-10-CM

## 2010-11-09 MED ORDER — METHYLPREDNISOLONE ACETATE 80 MG/ML IJ SUSP
80.0000 mg | Freq: Once | INTRAMUSCULAR | Status: AC
  Administered 2010-11-09: 80 mg via INTRAMUSCULAR

## 2010-11-09 MED ORDER — PREDNISONE 10 MG PO TABS: ORAL_TABLET | ORAL | Status: DC

## 2010-11-09 MED ORDER — ALBUTEROL SULFATE (5 MG/ML) 0.5% IN NEBU
2.5000 mg | INHALATION_SOLUTION | Freq: Once | RESPIRATORY_TRACT | Status: AC
  Administered 2010-11-09: 2.5 mg via RESPIRATORY_TRACT

## 2010-11-09 MED ORDER — MOXIFLOXACIN HCL 400 MG PO TABS: 400.0000 mg | ORAL_TABLET | Freq: Every day | ORAL | Status: AC

## 2010-11-09 NOTE — Patient Instructions (Signed)

## 2010-11-09 NOTE — Progress Notes (Signed)
  Subjective:     Henry [name] is a 60 y.o. male here for evaluation of a cough. Onset of symptoms was 2 weeks ago. Symptoms have been gradually worsening since that time. The cough is productive and is aggravated by exercise and reclining position. Associated symptoms include: shortness of breath and wheezing. Patient does not have a history of asthma. Patient does not have a history of environmental allergens. Patient has not traveled recently. Patient does not have a history of smoking. Patient has not had a previous chest x-ray. Patient has not had a PPD done.  The following portions of the patient's history were reviewed and updated as appropriate: allergies, current medications, past family history, past medical history, past social history, past surgical history and problem list.  Review of Systems Pertinent items are noted in HPI.    Objective:    Oxygen saturation 95% on room air BP 126/84  Pulse 77  Temp(Src) 97.7 F (36.5 C) (Oral)  Wt 286 lb 3.2 oz (129.819 kg)  SpO2 95% General appearance: alert, cooperative, appears stated age and mild distress Ears: normal TM's and external ear canals both ears Nose: Nares normal. Septum midline. Mucosa normal. No drainage or sinus tenderness. Throat: lips, mucosa, and tongue normal; teeth and gums normal Neck: no adenopathy, no carotid bruit, no JVD, supple, symmetrical, trachea midline and thyroid not enlarged, symmetric, no tenderness/mass/nodules Lungs: diminished breath sounds bilaterally, rales bilaterally and rhonchi bilaterally Heart: regular rate and rhythm, S1, S2 normal, no murmur, click, rub or gallop Extremities: extremities normal, atraumatic, no cyanosis or edema    Assessment:    Acute Bronchitis    Plan:    Antibiotics per medication orders. Antitussives per medication orders. Avoid exposure to tobacco smoke and fumes. Call if shortness of breath worsens, blood in sputum, change in character of cough,  development of fever or chills, inability to maintain nutrition and hydration. Avoid exposure to tobacco smoke and fumes. Chest x-ray.

## 2010-12-09 ENCOUNTER — Other Ambulatory Visit: Payer: Self-pay | Admitting: *Deleted

## 2010-12-09 MED ORDER — LOSARTAN POTASSIUM 50 MG PO TABS: 50.0000 mg | ORAL_TABLET | Freq: Every day | ORAL | Status: DC

## 2010-12-14 ENCOUNTER — Other Ambulatory Visit: Payer: Self-pay | Admitting: Internal Medicine

## 2010-12-15 MED ORDER — METHIMAZOLE 10 MG PO TABS: 10.0000 mg | ORAL_TABLET | Freq: Every day | ORAL | Status: DC

## 2010-12-15 NOTE — Telephone Encounter (Signed)
RX sent

## 2010-12-22 ENCOUNTER — Ambulatory Visit (INDEPENDENT_AMBULATORY_CARE_PROVIDER_SITE_OTHER): Payer: Federal, State, Local not specified - PPO

## 2010-12-22 DIAGNOSIS — Z23 Encounter for immunization: Secondary | ICD-10-CM

## 2010-12-23 ENCOUNTER — Other Ambulatory Visit: Payer: Self-pay | Admitting: Internal Medicine

## 2010-12-23 NOTE — Telephone Encounter (Signed)
Called the pharmacy and left message stating rx called in on 12/15/2010, if not on file please fill at this time

## 2011-01-11 ENCOUNTER — Other Ambulatory Visit: Payer: Self-pay | Admitting: Internal Medicine

## 2011-01-13 ENCOUNTER — Other Ambulatory Visit: Payer: Self-pay | Admitting: Internal Medicine

## 2011-02-14 ENCOUNTER — Telehealth: Payer: Self-pay | Admitting: Internal Medicine

## 2011-02-14 MED ORDER — JANUMET 50-1000 MG PO TABS: ORAL_TABLET | ORAL | Status: DC

## 2011-02-14 NOTE — Telephone Encounter (Signed)
a1C 250.00 

## 2011-02-14 NOTE — Telephone Encounter (Signed)
Refill- janumet 50-1,000mg  tablet. Take one tablet by mouth twice daily with 2 largest meals. Qty 60 last fill 01-11-11

## 2011-02-27 ENCOUNTER — Other Ambulatory Visit: Payer: Self-pay | Admitting: Internal Medicine

## 2011-03-12 ENCOUNTER — Other Ambulatory Visit: Payer: Self-pay | Admitting: Internal Medicine

## 2011-03-13 NOTE — Telephone Encounter (Signed)
Prescription sent to pharmacy.

## 2011-04-06 DIAGNOSIS — M25559 Pain in unspecified hip: Secondary | ICD-10-CM | POA: Diagnosis not present

## 2011-04-09 ENCOUNTER — Other Ambulatory Visit: Payer: Self-pay | Admitting: Cardiology

## 2011-04-09 ENCOUNTER — Other Ambulatory Visit: Payer: Self-pay | Admitting: Internal Medicine

## 2011-04-10 ENCOUNTER — Ambulatory Visit (INDEPENDENT_AMBULATORY_CARE_PROVIDER_SITE_OTHER): Payer: Federal, State, Local not specified - PPO | Admitting: Internal Medicine

## 2011-04-10 VITALS — BP 140/70 | HR 88 | Temp 97.8°F | Wt 275.0 lb

## 2011-04-10 DIAGNOSIS — IMO0001 Reserved for inherently not codable concepts without codable children: Secondary | ICD-10-CM

## 2011-04-10 DIAGNOSIS — K5900 Constipation, unspecified: Secondary | ICD-10-CM

## 2011-04-10 DIAGNOSIS — J209 Acute bronchitis, unspecified: Secondary | ICD-10-CM | POA: Diagnosis not present

## 2011-04-10 DIAGNOSIS — E0590 Thyrotoxicosis, unspecified without thyrotoxic crisis or storm: Secondary | ICD-10-CM | POA: Diagnosis not present

## 2011-04-10 DIAGNOSIS — G4730 Sleep apnea, unspecified: Secondary | ICD-10-CM

## 2011-04-10 MED ORDER — AZITHROMYCIN 250 MG PO TABS: ORAL_TABLET | ORAL | Status: AC

## 2011-04-10 MED ORDER — HYDROCODONE-HOMATROPINE 5-1.5 MG/5ML PO SYRP: 5.0000 mL | ORAL_SOLUTION | Freq: Four times a day (QID) | ORAL | Status: AC | PRN

## 2011-04-10 NOTE — Assessment & Plan Note (Signed)
His med list includes Tapazole 10 mg each day. Constipation raises the possibility of hypothyroidism.

## 2011-04-10 NOTE — Assessment & Plan Note (Addendum)
A1 C. was 8% on 08/19/10. Reassessment needed because of time  interval  &  questionable control. I explained uncontrolled  diabetes can lead to recurrent infections

## 2011-04-10 NOTE — Progress Notes (Signed)
Subjective:    Patient ID: Henry [name], male    DOB: 06/07/1950, 61 y.o.   MRN: [id number]  HPI Respiratory tract infection Onset/symptoms:4 days ago as rhinitis,sneezing & watery eyes Exposures (illness/environmental/extrinsic):wife sick last week Progression of symptoms:to chest Treatments/response:Benadryl with benefit Present symptoms: Fever/chills/sweats:no Frontal headache:no Facial pain:no Nasal purulence:creamy Sore throat:no Dental pain:no Lymphadenopathy:no Wheezing/shortness of breath:mainly @ night , must sit up Cough/sputum/hemoptysis:scant creamy sputum Pleuritic pain:no Past medical history: Seasonal allergies : no/asthma:no Smoking history:never           Review of Systems Diabetes status assessment: Fasting or morning glucose range:  120-160 . Highest glucose 2 hours after any meal:  Not checked. Hypoglycemia :  Only after fasting for > 4 hrs                                                     Excess thirst :no;  Excess hunger:  no ;  Excess urination:  Only with diuretic.                                  Lightheadedness with standing:  no. Chest pain:  no ; Palpitations :no ;  Pain in  calves with walking:  no .                                                                                                      Non healing skin  ulcers or sores,especially over the feet:  no. Numbness or tingling or burning in feet : no.                                                                                                                                             Significant change in  Weight : stable. Vision changes : no .                                                                    Exercise : no. Nutrition/diet:  Decreased meat Medication  compliance : yes. Medication adverse  Effects:  no . Eye exam : negative 2 mos ago. Foot care : 18 mos ago for inserts  A1c/ urine microalbumin monitor: A1c 8% in 8/12    Chronic constipation; no treatment.  TSH was 2.99 in 10/11     Objective:   Physical Exam General appearance:well nourished; no acute distress or increased work of breathing is present.  Waist excess.No  lymphadenopathy about the head, neck, or axilla noted.   Eyes: No conjunctival inflammation or lid edema is present. Ptosis OD > OS  Ears:  External ear exam shows no significant lesions or deformities.  Otoscopic examination reveals clear L canal. Wax on R. Aid worn on L Nose:  External nasal examination shows no deformity or inflammation. Nasal mucosa are pink and moist without lesions or exudates. No septal dislocation or deviation.No obstruction to airflow.   Oral exam: Dental hygiene is good; lips and gums are healthy appearing.There is no oropharyngeal erythema or exudate noted.   Neck:  No deformities, thyromegaly, masses, or tenderness noted. SQ hardware R neck.    Heart:  Normal rate and regular rhythm. S1 and S2 normal without gallop, murmur, click, rub or other extra sounds.   Lungs:Chest clear to auscultation; no wheezes, rhonchi,rales ,or rubs present.No increased work of breathing.   Bowel sounds are normal. Abdomen is soft and nontender with no organomegaly, hernias  or masses. Protuberant  Extremities:  No cyanosis, edema, or clubbing  noted .Amputation R index finger   Skin: Warm & dry w/o jaundice or tenting.  Neuro: DTRs 0-1/2 +. Tremor L foot. Light touch normal over feet          Assessment & Plan:  #1 acute bronchitis w/o bronchospasm ; nocturnal dyspnea reported  #2 chronic constipation, untreated Plan: See orders and recommendations

## 2011-04-10 NOTE — Patient Instructions (Signed)
Please verify which physician prescribed the CPAP; you should have a followup evaluation. Sleep apnea can lead to serious heart irregularities and even heart failure. Increase roughage(fruits and vegetables up to 7-9 servings/ day) in your diet. Drink to thirst up to 32 ounces of fluids daily. Use Metamucil or other agent daily as needed. Use MiraLax every third night as needed. Avoid calcium intake.

## 2011-04-10 NOTE — Telephone Encounter (Signed)
Refill done.  

## 2011-04-10 NOTE — Assessment & Plan Note (Signed)
He is not using his CPAP because of claustrophobia; he isn't sure who prescribed the CPAP initially

## 2011-04-11 LAB — CBC WITH DIFFERENTIAL/PLATELET
Basophils Absolute: 0 10*3/uL (ref 0.0–0.1)
Eosinophils Relative: 1.8 % (ref 0.0–5.0)
HCT: 40.2 % (ref 39.0–52.0)
Hemoglobin: 13.3 g/dL (ref 13.0–17.0)
Lymphocytes Relative: 40.7 % (ref 12.0–46.0)
Monocytes Relative: 9.5 % (ref 3.0–12.0)
Platelets: 144 10*3/uL — ABNORMAL LOW (ref 150.0–400.0)
RDW: 13.8 % (ref 11.5–14.6)
WBC: 5.9 10*3/uL (ref 4.5–10.5)

## 2011-04-11 LAB — TSH: TSH: 0.02 u[IU]/mL — ABNORMAL LOW (ref 0.35–5.50)

## 2011-04-14 DIAGNOSIS — M545 Low back pain, unspecified: Secondary | ICD-10-CM | POA: Diagnosis not present

## 2011-04-16 ENCOUNTER — Other Ambulatory Visit: Payer: Self-pay | Admitting: Internal Medicine

## 2011-04-17 ENCOUNTER — Ambulatory Visit
Admission: RE | Admit: 2011-04-17 | Discharge: 2011-04-17 | Disposition: A | Discharge status: Home / Self Care | Payer: Federal, State, Local not specified - PPO | Source: Ambulatory Visit | Attending: Physical Medicine and Rehabilitation | Admitting: Physical Medicine and Rehabilitation

## 2011-04-17 ENCOUNTER — Other Ambulatory Visit: Payer: Self-pay | Admitting: Physical Medicine and Rehabilitation

## 2011-04-17 DIAGNOSIS — IMO0002 Reserved for concepts with insufficient information to code with codable children: Secondary | ICD-10-CM | POA: Diagnosis not present

## 2011-04-17 DIAGNOSIS — M5137 Other intervertebral disc degeneration, lumbosacral region: Secondary | ICD-10-CM | POA: Diagnosis not present

## 2011-04-17 DIAGNOSIS — M79606 Pain in leg, unspecified: Secondary | ICD-10-CM

## 2011-04-17 DIAGNOSIS — M5126 Other intervertebral disc displacement, lumbar region: Secondary | ICD-10-CM | POA: Diagnosis not present

## 2011-04-17 DIAGNOSIS — M7918 Myalgia, other site: Secondary | ICD-10-CM

## 2011-04-17 DIAGNOSIS — M47817 Spondylosis without myelopathy or radiculopathy, lumbosacral region: Secondary | ICD-10-CM | POA: Diagnosis not present

## 2011-04-19 DIAGNOSIS — M5137 Other intervertebral disc degeneration, lumbosacral region: Secondary | ICD-10-CM | POA: Diagnosis not present

## 2011-04-19 DIAGNOSIS — M47817 Spondylosis without myelopathy or radiculopathy, lumbosacral region: Secondary | ICD-10-CM | POA: Diagnosis not present

## 2011-04-20 ENCOUNTER — Ambulatory Visit (INDEPENDENT_AMBULATORY_CARE_PROVIDER_SITE_OTHER): Payer: Federal, State, Local not specified - PPO | Admitting: Endocrinology

## 2011-04-20 ENCOUNTER — Encounter: Payer: Self-pay | Admitting: Endocrinology

## 2011-04-20 VITALS — BP 122/70 | HR 78 | Temp 98.0°F | Wt 268.0 lb

## 2011-04-20 DIAGNOSIS — E0590 Thyrotoxicosis, unspecified without thyrotoxic crisis or storm: Secondary | ICD-10-CM

## 2011-04-20 NOTE — Progress Notes (Signed)
Subjective:    Patient ID: Henry [name], male    DOB: 1950-09-14, 61 y.o.   MRN: [id number]  HPI Pt states 10 years of hyperthyroidism.  He has been rx'ed with tapazole, which he has taken steadily since then.  i last saw him some years ago.   He has a few years of moderate tremor, worst at the hands, and assoc fatigue. Past Medical History  Diagnosis Date  . Cardiomyopathy   . CAD (coronary artery disease)   . HTN (hypertension)   . MI (myocardial infarction)   . Sleep apnea   . GERD (gastroesophageal reflux disease)   . Hyperthyroidism   . DM (diabetes mellitus)   . Hyperplasia, prostate   . Benign neoplasm of colon   . Nephrolithiasis   . Ventral hernia   . HLD (hyperlipidemia)   . Parkinson disease     Past Surgical History  Procedure Date  . Coronary artery bypass graft 2000    Tressie Stalker, MD  . Coronary stent placement 1998  . Median sternotomy 2000    History   Social History  . Marital Status: Married    Spouse Name: N/A    Number of Children: N/A  . Years of Education: N/A   Occupational History  . Not on file.   Social History Main Topics  . Smoking status: Never Smoker   . Smokeless tobacco: Never Used  . Alcohol Use: No  . Drug Use: No  . Sexually Active: Not on file   Other Topics Concern  . Not on file   Social History Narrative  . No narrative on file    Current Outpatient Prescriptions on File Prior to Visit  Medication Sig Dispense Refill  . aspirin 81 MG tablet Take 81 mg by mouth daily.        . CRESTOR 20 MG tablet TAKE 1 TABLET EVERY MORNING  30 tablet  6  . furosemide (LASIX) 80 MG tablet TAKE 1 TABLET (80 MG TOTAL) BY MOUTH AS DIRECTED. 1 BY MOUTH IN THE AM, 1/2 BY MOUTH IN THE PM  45 tablet  5  . glimepiride (AMARYL) 2 MG tablet TAKE 1 TABLET EVERY MORNING ,AND 1/2 TABLET WITH EVENING MEAL  135 tablet  1  . HYDROcodone-homatropine (HYDROMET) 5-1.5 MG/5ML syrup Take 5 mLs by mouth every 6 (six) hours as needed for cough.   120 mL  0  . JANUMET 50-1000 MG per tablet TAKE 1 TABLET BY MOUTH TWICE DAILY WITH 2 LARGEST MEALS  60 tablet  3  . losartan (COZAAR) 50 MG tablet TAKE 1 TABLET (50 MG TOTAL) BY MOUTH DAILY.  30 tablet  3  . methimazole (TAPAZOLE) 10 MG tablet Take 1 tablet (10 mg total) by mouth daily.  90 tablet  1  . metoprolol (TOPROL-XL) 50 MG 24 hr tablet Take 1 tablet (50 mg total) by mouth daily.  30 tablet  11  . mometasone (ASMANEX) 220 MCG/INH inhaler Inhale 1 puff into the lungs 2 (two) times daily. one inhalation every 12 hours; gargle and spit after use    1 Inhaler  2  . potassium chloride (KLOR-CON M10) 10 MEQ tablet 3 by mouth once daily  90 tablet  11  . pramipexole (MIRAPEX) 0.5 MG tablet Take 0.5 mg by mouth 3 (three) times daily.        . traMADol (ULTRAM) 50 MG tablet TAKE 1 TABLET BY MOUTH EVERY 6 HOURS AS NEEDED  60 tablet  1  .  travoprost, benzalkonium, (TRAVATAN) 0.004 % ophthalmic solution Place 1 drop into the right eye at bedtime.        . trihexyphenidyl (ARTANE) 2 MG tablet Take 2 mg by mouth 3 (three) times daily with meals.          Allergies  Allergen Reactions  . Penicillins     rash    Family History  Problem Relation Age of Onset  . Peripheral vascular disease    . Arthritis    . Diabetes    no goiter or other thyroid probs.    BP 122/70  Pulse 78  Temp(Src) 98 F (36.7 C) (Oral)  Wt 268 lb (121.564 kg)  SpO2 98%    Review of Systems denies weight loss, headache, double vision, palpitations, diarrhea, polyuria, myalgias, numbness, seizure, hypoglycemia, bruising, and rhinorrhea.  He reports hoarseness, anxiety, doe, excessive diaphoresis, and dysphagia.     Objective:   Physical Exam VS: see vs page GEN: no distress HEAD: head: no deformity eyes: no periorbital swelling, no proptosis external nose and ears are normal mouth: no lesion seen NECK: supple, thyroid is slightly enlarged, but i can't be sure CHEST WALL: no deformity LUNGS: clear to  auscultation CV: reg rate and rhythm, no murmur MUSCULOSKELETAL: muscle bulk and strength are grossly normal.  no obvious joint swelling.  gait is normal and steady EXTEMITIES: no deformity, except for partial amputation of the right index finger.  no ulcer on the feet.  feet are of normal color and temp.  1+ bilat leg edema PULSES: dorsalis pedis intact bilat.  no carotid bruit NEURO:  cn 2-12 grossly intact.   readily moves all 4's.  sensation is intact to touch on the feet.  There is a 3/sec tremor of both upper extremities SKIN:  Normal texture and temperature.  No rash or suspicious lesion is visible.   NODES:  None palpable at the neck PSYCH: alert, oriented x3.  Does not appear anxious nor depressed.   Lab Results  Component Value Date   TSH 0.02* 04/10/2011      Assessment & Plan:  Hyperthyroidism, prob due to grave's dz, needs increased rx Parkinson's dz.  There is a high risk that these sxs could be exac by hyperthyroidism. Anxiety, prob exac by hyperthyroidism CAD.  In view of this, he needs normalization of TFT

## 2011-04-20 NOTE — Patient Instructions (Addendum)
Stop the methimazole  Then let's check a thyroid "scan" (a special, but easy and painless type of thyroid x ray).  It works like this: you go to the x-ray department of the hospital to swallow a pill, which contains a miniscule amount of radiation.  You will not notice any symptoms from this.  You will go back to the x-ray department the next day, to lie down in front of a camera.  The results of this will be sent to me.   Based on the results, i hope to order for you a treatment pill of radioactive iodine.  Although it is a larger amount of radiation, you will again notice no symptoms from this.  The pill is gone from your body in a few days (during which you should stay away from other people), but takes several months to work.  Therefore, please return here approximately 6-8 weeks after the treatment.  Also, resume the methimazole, at 2x10 mg, 2x a day, approx. 3 days after the radioactive iodine pill.  This treatment has been available for many years, and the only known side-effect is an underactive thyroid.  It is possible that i would eventually prescribe for you a thyroid hormone pill, which is very inexpensive.  You don't have to worry about side-effects of this thyroid hormone pill, because it is the same molecule your thyroid makes.

## 2011-04-27 ENCOUNTER — Encounter (HOSPITAL_COMMUNITY)
Admission: RE | Admit: 2011-04-27 | Discharge: 2011-04-27 | Disposition: A | Discharge status: Home / Self Care | Payer: Federal, State, Local not specified - PPO | Source: Ambulatory Visit | Attending: Endocrinology | Admitting: Endocrinology

## 2011-04-27 DIAGNOSIS — E0500 Thyrotoxicosis with diffuse goiter without thyrotoxic crisis or storm: Secondary | ICD-10-CM | POA: Insufficient documentation

## 2011-04-27 DIAGNOSIS — E049 Nontoxic goiter, unspecified: Secondary | ICD-10-CM | POA: Diagnosis not present

## 2011-04-27 DIAGNOSIS — E0590 Thyrotoxicosis, unspecified without thyrotoxic crisis or storm: Secondary | ICD-10-CM

## 2011-04-27 DIAGNOSIS — R5381 Other malaise: Secondary | ICD-10-CM | POA: Insufficient documentation

## 2011-04-27 DIAGNOSIS — R946 Abnormal results of thyroid function studies: Secondary | ICD-10-CM | POA: Insufficient documentation

## 2011-04-28 ENCOUNTER — Encounter (HOSPITAL_COMMUNITY)
Admission: RE | Admit: 2011-04-28 | Discharge: 2011-04-28 | Disposition: A | Discharge status: Home / Self Care | Payer: Federal, State, Local not specified - PPO | Source: Ambulatory Visit | Attending: Endocrinology | Admitting: Endocrinology

## 2011-04-28 ENCOUNTER — Other Ambulatory Visit: Payer: Self-pay | Admitting: Endocrinology

## 2011-04-28 DIAGNOSIS — E0590 Thyrotoxicosis, unspecified without thyrotoxic crisis or storm: Secondary | ICD-10-CM

## 2011-04-28 MED ORDER — SODIUM IODIDE I 131 CAPSULE
12.7000 | Freq: Once | INTRAVENOUS | Status: AC | PRN
  Administered 2011-04-28: 12.7 via ORAL

## 2011-04-28 MED ORDER — SODIUM PERTECHNETATE TC 99M INJECTION
10.2000 | Freq: Once | INTRAVENOUS | Status: AC | PRN
  Administered 2011-04-28: 10 via INTRAVENOUS

## 2011-05-11 DIAGNOSIS — H4030X0 Glaucoma secondary to eye trauma, unspecified eye, stage unspecified: Secondary | ICD-10-CM | POA: Diagnosis not present

## 2011-05-11 DIAGNOSIS — H40019 Open angle with borderline findings, low risk, unspecified eye: Secondary | ICD-10-CM | POA: Diagnosis not present

## 2011-05-11 DIAGNOSIS — H18519 Endothelial corneal dystrophy, unspecified eye: Secondary | ICD-10-CM | POA: Diagnosis not present

## 2011-05-12 ENCOUNTER — Encounter (HOSPITAL_COMMUNITY)
Admission: RE | Admit: 2011-05-12 | Discharge: 2011-05-12 | Disposition: A | Discharge status: Home / Self Care | Payer: Federal, State, Local not specified - PPO | Source: Ambulatory Visit | Attending: Endocrinology | Admitting: Endocrinology

## 2011-05-12 DIAGNOSIS — E0590 Thyrotoxicosis, unspecified without thyrotoxic crisis or storm: Secondary | ICD-10-CM | POA: Insufficient documentation

## 2011-05-12 MED ORDER — SODIUM IODIDE I 131 CAPSULE
18.2000 | Freq: Once | INTRAVENOUS | Status: AC | PRN
  Administered 2011-05-12: 18.2 via ORAL

## 2011-05-22 ENCOUNTER — Other Ambulatory Visit: Payer: Self-pay | Admitting: Internal Medicine

## 2011-05-22 DIAGNOSIS — G4733 Obstructive sleep apnea (adult) (pediatric): Secondary | ICD-10-CM | POA: Diagnosis not present

## 2011-05-25 ENCOUNTER — Ambulatory Visit (INDEPENDENT_AMBULATORY_CARE_PROVIDER_SITE_OTHER): Payer: Federal, State, Local not specified - PPO | Admitting: Cardiology

## 2011-05-25 ENCOUNTER — Encounter: Payer: Self-pay | Admitting: Cardiology

## 2011-05-25 VITALS — BP 112/67 | HR 77 | Wt 264.0 lb

## 2011-05-25 DIAGNOSIS — E785 Hyperlipidemia, unspecified: Secondary | ICD-10-CM | POA: Diagnosis not present

## 2011-05-25 DIAGNOSIS — I2510 Atherosclerotic heart disease of native coronary artery without angina pectoris: Secondary | ICD-10-CM | POA: Diagnosis not present

## 2011-05-25 DIAGNOSIS — I2589 Other forms of chronic ischemic heart disease: Secondary | ICD-10-CM | POA: Diagnosis not present

## 2011-05-25 DIAGNOSIS — I10 Essential (primary) hypertension: Secondary | ICD-10-CM

## 2011-05-25 MED ORDER — DIAZEPAM 5 MG PO TABS: 5.0000 mg | ORAL_TABLET | Freq: Four times a day (QID) | ORAL | Status: AC | PRN

## 2011-05-25 NOTE — Patient Instructions (Signed)
Your physician wants you to follow-up in: ONE YEAR WITH DR Shelda Pal will receive a reminder letter in the mail two months in advance. If you don't receive a letter, please call our office to schedule the follow-up appointment.   Your physician has requested that you have a lexiscan myoview. For further information please visit [email or web address] Please follow instruction sheet, as given. BRING VALIUM TO THE OFFICE WITH YOU  Your physician has requested that you have an echocardiogram. Echocardiography is a painless test that uses sound waves to create images of your heart. It provides your doctor with information about the size and shape of your heart and how well your heart's chambers and valves are working. This procedure takes approximately one hour. There are no restrictions for this procedure.

## 2011-05-25 NOTE — Assessment & Plan Note (Signed)
Continue ARB and beta blocker. Echocardiogram to quantify LV function.

## 2011-05-25 NOTE — Assessment & Plan Note (Signed)
Continue aspirin and statin. Schedule lexiscan Myoview for risk stratification. Will give Valium prior to the study for claustrophobia.

## 2011-05-25 NOTE — Progress Notes (Signed)
HPI: Pleasant male with past medical history of coronary artery disease status post bypass and graft, Parkinson's, diabetes and hypertension as well as hyperlipidemia. His last Myoview was performed on May 10, 2007. At that time ejection fraction was 39%. There was a prior anterior and apical infarct but no ischemia. Carotid Dopplers in January 2005 showed 0-39% stenosis. Echocardiogram in 2005 showed grossly normal LV function but technically difficult images. I last saw the patient in May of 2012. We ordered a Myoview but the patient became claustrophobic and could not complete the study. Since that last saw him, the patient has dyspnea with more extreme activities but not with routine activities. It is relieved with rest. It is not associated with chest pain. There is no orthopnea, PND or pedal edema. There is no syncope or palpitations. There is no exertional chest pain.    Current Outpatient Prescriptions  Medication Sig Dispense Refill  . aspirin 81 MG tablet Take 81 mg by mouth daily.        . CRESTOR 20 MG tablet TAKE 1 TABLET EVERY MORNING  30 tablet  2  . furosemide (LASIX) 80 MG tablet TAKE 1 TABLET (80 MG TOTAL) BY MOUTH AS DIRECTED. 1 BY MOUTH IN THE AM, 1/2 BY MOUTH IN THE PM  45 tablet  5  . glimepiride (AMARYL) 2 MG tablet TAKE 1 TABLET EVERY MORNING ,AND 1/2 TABLET WITH EVENING MEAL  135 tablet  1  . JANUMET 50-1000 MG per tablet TAKE 1 TABLET BY MOUTH TWICE DAILY WITH 2 LARGEST MEALS  60 tablet  3  . losartan (COZAAR) 50 MG tablet TAKE 1 TABLET (50 MG TOTAL) BY MOUTH DAILY.  30 tablet  3  . methimazole (TAPAZOLE) 10 MG tablet Take 1 tablet by mouth BID times 48H.      . metoprolol (TOPROL-XL) 50 MG 24 hr tablet Take 1 tablet (50 mg total) by mouth daily.  30 tablet  11  . mometasone (ASMANEX) 220 MCG/INH inhaler Inhale 1 puff into the lungs 2 (two) times daily. one inhalation every 12 hours; gargle and spit after use    1 Inhaler  2  . potassium chloride (KLOR-CON M10) 10 MEQ  tablet 3 by mouth once daily  90 tablet  11  . pramipexole (MIRAPEX) 0.5 MG tablet Take 0.5 mg by mouth 3 (three) times daily.        . traMADol (ULTRAM) 50 MG tablet TAKE 1 TABLET BY MOUTH EVERY 6 HOURS AS NEEDED  60 tablet  1  . travoprost, benzalkonium, (TRAVATAN) 0.004 % ophthalmic solution Place 1 drop into the right eye at bedtime.        . trihexyphenidyl (ARTANE) 2 MG tablet Take 2 mg by mouth 3 (three) times daily with meals.           Past Medical History  Diagnosis Date  . Cardiomyopathy   . CAD (coronary artery disease)   . HTN (hypertension)   . MI (myocardial infarction)   . Sleep apnea   . GERD (gastroesophageal reflux disease)   . Hyperthyroidism   . DM (diabetes mellitus)   . Hyperplasia, prostate   . Benign neoplasm of colon   . Nephrolithiasis   . Ventral hernia   . HLD (hyperlipidemia)   . Parkinson disease     Past Surgical History  Procedure Date  . Coronary artery bypass graft 2000    Tressie Stalker, MD  . Coronary stent placement 1998  . Median sternotomy 2000  History   Social History  . Marital Status: Married    Spouse Name: N/A    Number of Children: N/A  . Years of Education: N/A   Occupational History  . Not on file.   Social History Main Topics  . Smoking status: Never Smoker   . Smokeless tobacco: Never Used  . Alcohol Use: No  . Drug Use: No  . Sexually Active: Not on file   Other Topics Concern  . Not on file   Social History Narrative  . No narrative on file    ROS: no fevers or chills, productive cough, hemoptysis, dysphasia, odynophagia, melena, hematochezia, dysuria, hematuria, rash, seizure activity, orthopnea, PND, pedal edema, claudication. Remaining systems are negative.  Physical Exam: Well-developed well-nourished in no acute distress.  Skin is warm and dry.  HEENT is normal.  Neck is supple.  Chest is clear to auscultation with normal expansion.  Cardiovascular exam is regular rate and rhythm.  Abdominal  exam nontender or distended. No masses palpated. Extremities show no edema. neuro Resting tremor  ECG sinus rhythm with occasional PVC. Axis normal. Cannot rule out prior anterior infarct. Nonspecific T-wave changes.

## 2011-05-25 NOTE — Assessment & Plan Note (Signed)
Continue statin. 

## 2011-05-25 NOTE — Assessment & Plan Note (Signed)
Blood pressure controlled. Continue present medications. Potassium and renal function monitored by primary care. 

## 2011-05-30 ENCOUNTER — Other Ambulatory Visit: Payer: Self-pay

## 2011-05-30 ENCOUNTER — Ambulatory Visit (HOSPITAL_COMMUNITY): Payer: Federal, State, Local not specified - PPO | Attending: Cardiology

## 2011-05-30 DIAGNOSIS — I2510 Atherosclerotic heart disease of native coronary artery without angina pectoris: Secondary | ICD-10-CM | POA: Diagnosis not present

## 2011-05-30 DIAGNOSIS — G20A1 Parkinson's disease without dyskinesia, without mention of fluctuations: Secondary | ICD-10-CM | POA: Insufficient documentation

## 2011-05-30 DIAGNOSIS — I10 Essential (primary) hypertension: Secondary | ICD-10-CM | POA: Insufficient documentation

## 2011-05-30 DIAGNOSIS — G20 Parkinson's disease: Secondary | ICD-10-CM | POA: Insufficient documentation

## 2011-05-30 DIAGNOSIS — E785 Hyperlipidemia, unspecified: Secondary | ICD-10-CM | POA: Insufficient documentation

## 2011-05-31 ENCOUNTER — Ambulatory Visit (HOSPITAL_COMMUNITY): Payer: Federal, State, Local not specified - PPO

## 2011-06-01 ENCOUNTER — Ambulatory Visit (HOSPITAL_COMMUNITY): Payer: Federal, State, Local not specified - PPO | Admitting: Radiology

## 2011-06-01 ENCOUNTER — Encounter (HOSPITAL_COMMUNITY): Payer: Self-pay | Admitting: Radiology

## 2011-06-01 NOTE — Progress Notes (Signed)
Patient came in today for a Stress Myoview, but unfortunately the study had to be cancelled due to residual radioactivity in his system from a Radioactive Iodine treatment done in April. The patient received an IV(20G)-RAC, by T.Kaheny,EMT-P and Tc87m Tetrofosmin 11.0 mci, but when Rest Imaging was attempted, this was when the Nuclear staff realized there was conflicting radioactivity in his system. Dr. Ludwig Clarks nurse was informed, and patient was told he could leave, and Dr. Ludwig Clarks nurse would follow up with him.

## 2011-06-01 NOTE — Progress Notes (Deleted)
[facility] SITE 3 NUCLEAR MED [address] [place] [address] [phone]  Cardiology Nuclear Med Study  Henry [name] is a 61 y.o. male     MRN : [id number]     DOB: 30-Apr-1950  Procedure Date: 06/01/2011  Nuclear Med Background Indication for Stress Test:  Evaluation for Ischemia, Graft Patency and Stent Patency History:  {CHL HISTORY STRESS OZHY:86578} Cardiac Risk Factors: Carotid Disease, Hypertension, Lipids and NIDDM  Symptoms:  {CHL SYMPTOMS STRESS IONG:29528413}   Nuclear Pre-Procedure Caffeine/Decaff Intake:  None NPO After: 7:00pm   Lungs:  {Exam; lungs brief:12271} O2 Sat: ***% on {Exam; oxygen delivery:30093}. IV 0.9% NS with Angio Cath:  20g  IV Site: R Antecubital  IV Started by:  Stanton Kidney, EMT-P  Chest Size (in):  52 Cup Size: n/a  Height: 5\' 8"  (1.727 m)  Weight:  264 lb (119.75 kg)  BMI:  Body mass index is 40.14 kg/(m^2). Tech Comments:  CBG= 128 @ 7:15am this day, per patient.    Nuclear Med Study 1 or 2 day study: {CHL 1 OR 2 DAY STUDY:21021019}  Stress Test Type:  {CHL STRESS TEST TYPE:21021018}  Reading MD: {CHL LB NUC READING KG:40102725}  Order Authorizing Provider:  ***  Resting Radionuclide: {CHL RESTING RADIONUCLIDE:21021021}  Resting Radionuclide Dose: *** mCi   Stress Radionuclide:  {CHL STRESS RADIONUCLIDE:21021022}  Stress Radionuclide Dose: *** mCi           Stress Protocol Rest HR: *** Stress HR: ***  Rest BP: *** Stress BP: ***  Exercise Time (min): {NA AND WILDCARD:21589} METS: {NA AND WILDCARD:21589}          Dose of Adenosine (mg):  {NA AND DGUYQIHK:74259} Dose of Lexiscan: {CHL CARD WILDCARD AND 0.4:21590} mg  Dose of Atropine (mg): {NA AND DGLOVFIE:33295} Dose of Dobutamine: {NA AND WILDCARD:21589} mcg/kg/min (at max HR)  Stress Test Technologist: {CHL LB STRESS TEST TECHNOLOGIST:21021024}  Nuclear Technologist:  {CHL LB NUCLEAR TECHNOLOGIST:21021025}     Rest Procedure:  {CHL REST PROCEDURE  NUCLEAR:21021027} Rest ECG: {CHL REST JOA:41660}  Stress Procedure:  {CHL STRESS PROCEDURE NUCLEAR:21021028} Stress ECG: {CHL CAR STRESS ECG:21561}  QPS Raw Data Images:  {CHL RAW DATA IMAGES NUC:21021029} Stress Images:  {CHL STRESS IMAGES NUC:21021030} Rest Images:  {CHL REST IMAGES NUC:21021031} Subtraction (SDS):  {CHL SUBTRACTION (SDS) NUC:21021032} Transient Ischemic Dilatation (Normal <1.22):  *** Lung/Heart Ratio (Normal <0.45):  ***  Quantitative Gated Spect Images QGS EDV:  *** ml QGS ESV:  *** ml  Impression Exercise Capacity:  {CHL EXERCISE CAPACITY NUC:21021037} BP Response:  {CHL BP RESPONSE NUC:21021038} Clinical Symptoms:  {CHL CLINICAL SYMPTOMS NUC:21021039} ECG Impression:  {CHL ECG IMPRESSION NUC:21021040} Comparison with Prior Nuclear Study: {CHL NUCLEAR STUDY COMPARISON:21562}  Overall Impression:  {CHL OVERALL IMPRESSION NUC:21021041}  LV Ejection Fraction: {CHL CARD STUDY NOT GATED:21592:o}.  LV Wall Motion:  {CHL CARD QGS:21591:o}

## 2011-06-01 NOTE — Progress Notes (Deleted)
  Subjective:    Patient ID: Henry [name], male    DOB: 1950/03/01, 61 y.o.   MRN: [id number]  HPI    Review of Systems     Objective:   Physical Exam        Assessment & Plan:

## 2011-06-01 NOTE — Progress Notes (Deleted)
Patient ID: Henry [name], male   DOB: Oct 28, 1950, 61 y.o.   MRN: [id number]

## 2011-06-06 ENCOUNTER — Telehealth (HOSPITAL_COMMUNITY): Payer: Self-pay | Admitting: Radiology

## 2011-06-06 NOTE — Telephone Encounter (Signed)
Noted. Henry [name] 

## 2011-06-13 ENCOUNTER — Encounter: Payer: Self-pay | Admitting: Endocrinology

## 2011-06-13 ENCOUNTER — Telehealth: Payer: Self-pay | Admitting: *Deleted

## 2011-06-13 ENCOUNTER — Ambulatory Visit (INDEPENDENT_AMBULATORY_CARE_PROVIDER_SITE_OTHER): Payer: Federal, State, Local not specified - PPO | Admitting: Endocrinology

## 2011-06-13 ENCOUNTER — Other Ambulatory Visit (INDEPENDENT_AMBULATORY_CARE_PROVIDER_SITE_OTHER): Payer: Federal, State, Local not specified - PPO

## 2011-06-13 VITALS — BP 102/70 | HR 74 | Temp 97.1°F | Ht 68.0 in | Wt 269.0 lb

## 2011-06-13 DIAGNOSIS — E0590 Thyrotoxicosis, unspecified without thyrotoxic crisis or storm: Secondary | ICD-10-CM

## 2011-06-13 NOTE — Telephone Encounter (Signed)
Called pt to inform of lab results, pt informed (letter also mailed to pt). 

## 2011-06-13 NOTE — Patient Instructions (Addendum)
blood tests are being requested for you today.  You will receive a letter with results. Please come back for a follow-up appointment for 1 month. if ever you have fever while taking methimazole, stop it and call us, because of the risk of a rare side-effect

## 2011-06-13 NOTE — Progress Notes (Signed)
Subjective:    Patient ID: Henry [name], male    DOB: 1950/02/13, 61 y.o.   MRN: [id number]  HPI Pt is 5 weeks s/p i-131 rx for i-131 rx for hyperthyroidism due to grave's dz.  He is on tapazole for more prompt improvement.  He could not do his myoview due to residual i-131.  He reports excessive diaphoresis.   Past Medical History  Diagnosis Date  . Cardiomyopathy   . CAD (coronary artery disease)   . HTN (hypertension)   . MI (myocardial infarction)   . Sleep apnea   . GERD (gastroesophageal reflux disease)   . Hyperthyroidism   . DM (diabetes mellitus)   . Hyperplasia, prostate   . Benign neoplasm of colon   . Nephrolithiasis   . Ventral hernia   . HLD (hyperlipidemia)   . Parkinson disease     Past Surgical History  Procedure Date  . Coronary artery bypass graft 2000    Tressie Stalker, MD  . Coronary stent placement 1998  . Median sternotomy 2000    History   Social History  . Marital Status: Married    Spouse Name: N/A    Number of Children: N/A  . Years of Education: N/A   Occupational History  . Not on file.   Social History Main Topics  . Smoking status: Never Smoker   . Smokeless tobacco: Never Used  . Alcohol Use: No  . Drug Use: No  . Sexually Active: Not on file   Other Topics Concern  . Not on file   Social History Narrative  . No narrative on file    Current Outpatient Prescriptions on File Prior to Visit  Medication Sig Dispense Refill  . aspirin 81 MG tablet Take 81 mg by mouth daily.        . CRESTOR 20 MG tablet TAKE 1 TABLET EVERY MORNING  30 tablet  2  . furosemide (LASIX) 80 MG tablet TAKE 1 TABLET (80 MG TOTAL) BY MOUTH AS DIRECTED. 1 BY MOUTH IN THE AM, 1/2 BY MOUTH IN THE PM  45 tablet  5  . glimepiride (AMARYL) 2 MG tablet TAKE 1 TABLET EVERY MORNING ,AND 1/2 TABLET WITH EVENING MEAL  135 tablet  1  . JANUMET 50-1000 MG per tablet TAKE 1 TABLET BY MOUTH TWICE DAILY WITH 2 LARGEST MEALS  60 tablet  3  . losartan (COZAAR) 50 MG  tablet TAKE 1 TABLET (50 MG TOTAL) BY MOUTH DAILY.  30 tablet  3  . methimazole (TAPAZOLE) 10 MG tablet Take 1 tablet by mouth 2 (two) times daily.       . metoprolol (TOPROL-XL) 50 MG 24 hr tablet Take 1 tablet (50 mg total) by mouth daily.  30 tablet  11  . potassium chloride (KLOR-CON M10) 10 MEQ tablet 3 by mouth once daily  90 tablet  11  . pramipexole (MIRAPEX) 0.5 MG tablet Take 0.5 mg by mouth 3 (three) times daily.        . traMADol (ULTRAM) 50 MG tablet TAKE 1 TABLET BY MOUTH EVERY 6 HOURS AS NEEDED  60 tablet  1  . travoprost, benzalkonium, (TRAVATAN) 0.004 % ophthalmic solution Place 1 drop into the right eye at bedtime.        . trihexyphenidyl (ARTANE) 2 MG tablet Take 2 mg by mouth 3 (three) times daily with meals.          Allergies  Allergen Reactions  . Penicillins     rash  Family History  Problem Relation Age of Onset  . Peripheral vascular disease    . Arthritis    . Diabetes      BP 102/70  Pulse 74  Temp(Src) 97.1 F (36.2 C) (Oral)  Ht 5\' 8"  (1.727 m)  Wt 269 lb (122.018 kg)  BMI 40.90 kg/m2  SpO2 96%   Review of Systems Denies fever    Objective:   Physical Exam VITAL SIGNS:  See vs page GENERAL: no distress NECK: There is no palpable thyroid enlargement.  No thyroid nodule is palpable.  No palpable lymphadenopathy at the anterior neck. Neuro:  Moderate slow tremor   Lab Results  Component Value Date   TSH 0.02* 06/13/2011      Assessment & Plan:  Hyperthyroidism.  Not better yet

## 2011-06-18 ENCOUNTER — Other Ambulatory Visit: Payer: Self-pay | Admitting: Internal Medicine

## 2011-06-19 DIAGNOSIS — G4733 Obstructive sleep apnea (adult) (pediatric): Secondary | ICD-10-CM | POA: Diagnosis not present

## 2011-06-19 NOTE — Telephone Encounter (Signed)
Please refill x 3 mos  

## 2011-06-19 NOTE — Telephone Encounter (Signed)
Last OV 04-10-11,

## 2011-06-19 NOTE — Telephone Encounter (Signed)
Prescribed by Dr. Lanna Poche; please forward

## 2011-07-15 ENCOUNTER — Other Ambulatory Visit: Payer: Self-pay | Admitting: Internal Medicine

## 2011-08-07 DIAGNOSIS — M25559 Pain in unspecified hip: Secondary | ICD-10-CM | POA: Diagnosis not present

## 2011-08-07 DIAGNOSIS — G4733 Obstructive sleep apnea (adult) (pediatric): Secondary | ICD-10-CM | POA: Diagnosis not present

## 2011-08-07 DIAGNOSIS — G20 Parkinson's disease: Secondary | ICD-10-CM | POA: Diagnosis not present

## 2011-08-07 DIAGNOSIS — G2581 Restless legs syndrome: Secondary | ICD-10-CM | POA: Diagnosis not present

## 2011-08-13 ENCOUNTER — Other Ambulatory Visit: Payer: Self-pay | Admitting: Cardiology

## 2011-08-14 ENCOUNTER — Telehealth: Payer: Self-pay | Admitting: Endocrinology

## 2011-08-14 DIAGNOSIS — G4752 REM sleep behavior disorder: Secondary | ICD-10-CM | POA: Diagnosis not present

## 2011-08-14 DIAGNOSIS — G2581 Restless legs syndrome: Secondary | ICD-10-CM | POA: Diagnosis not present

## 2011-08-14 DIAGNOSIS — G20 Parkinson's disease: Secondary | ICD-10-CM | POA: Diagnosis not present

## 2011-08-14 DIAGNOSIS — G4733 Obstructive sleep apnea (adult) (pediatric): Secondary | ICD-10-CM | POA: Diagnosis not present

## 2011-08-14 NOTE — Telephone Encounter (Signed)
Forward 5 pages from [facility] to Dr. Romero Belling for review on 08-14-11 ym

## 2011-08-15 DIAGNOSIS — IMO0002 Reserved for concepts with insufficient information to code with codable children: Secondary | ICD-10-CM | POA: Diagnosis not present

## 2011-08-15 DIAGNOSIS — M47817 Spondylosis without myelopathy or radiculopathy, lumbosacral region: Secondary | ICD-10-CM | POA: Diagnosis not present

## 2011-08-16 DIAGNOSIS — IMO0002 Reserved for concepts with insufficient information to code with codable children: Secondary | ICD-10-CM | POA: Diagnosis not present

## 2011-08-16 DIAGNOSIS — M5137 Other intervertebral disc degeneration, lumbosacral region: Secondary | ICD-10-CM | POA: Diagnosis not present

## 2011-08-19 ENCOUNTER — Other Ambulatory Visit: Payer: Self-pay | Admitting: Internal Medicine

## 2011-08-19 ENCOUNTER — Other Ambulatory Visit: Payer: Self-pay | Admitting: Endocrinology

## 2011-08-21 NOTE — Telephone Encounter (Signed)
A1C 250.00 

## 2011-08-24 ENCOUNTER — Telehealth: Payer: Self-pay | Admitting: Internal Medicine

## 2011-08-24 DIAGNOSIS — E119 Type 2 diabetes mellitus without complications: Secondary | ICD-10-CM

## 2011-08-24 NOTE — Telephone Encounter (Signed)
Pt would like to have A1c done at [facility] location. Can you put the orders in for me please and I'll call and let him know. Thank you.

## 2011-08-24 NOTE — Telephone Encounter (Signed)
Order placed

## 2011-08-28 ENCOUNTER — Other Ambulatory Visit: Payer: Self-pay | Admitting: Internal Medicine

## 2011-09-03 ENCOUNTER — Other Ambulatory Visit: Payer: Self-pay | Admitting: Internal Medicine

## 2011-09-04 ENCOUNTER — Ambulatory Visit (HOSPITAL_COMMUNITY): Payer: Federal, State, Local not specified - PPO | Attending: Internal Medicine | Admitting: Radiology

## 2011-09-04 VITALS — BP 109/67 | Ht 68.0 in | Wt 270.0 lb

## 2011-09-04 DIAGNOSIS — Z951 Presence of aortocoronary bypass graft: Secondary | ICD-10-CM | POA: Insufficient documentation

## 2011-09-04 DIAGNOSIS — R0789 Other chest pain: Secondary | ICD-10-CM | POA: Insufficient documentation

## 2011-09-04 DIAGNOSIS — I2510 Atherosclerotic heart disease of native coronary artery without angina pectoris: Secondary | ICD-10-CM

## 2011-09-04 DIAGNOSIS — R5383 Other fatigue: Secondary | ICD-10-CM | POA: Insufficient documentation

## 2011-09-04 DIAGNOSIS — E119 Type 2 diabetes mellitus without complications: Secondary | ICD-10-CM | POA: Insufficient documentation

## 2011-09-04 DIAGNOSIS — R0989 Other specified symptoms and signs involving the circulatory and respiratory systems: Secondary | ICD-10-CM | POA: Insufficient documentation

## 2011-09-04 DIAGNOSIS — I10 Essential (primary) hypertension: Secondary | ICD-10-CM | POA: Insufficient documentation

## 2011-09-04 DIAGNOSIS — R0609 Other forms of dyspnea: Secondary | ICD-10-CM | POA: Insufficient documentation

## 2011-09-04 DIAGNOSIS — R5381 Other malaise: Secondary | ICD-10-CM | POA: Insufficient documentation

## 2011-09-04 DIAGNOSIS — R079 Chest pain, unspecified: Secondary | ICD-10-CM

## 2011-09-04 DIAGNOSIS — R0602 Shortness of breath: Secondary | ICD-10-CM

## 2011-09-04 MED ORDER — REGADENOSON 0.4 MG/5ML IV SOLN
0.4000 mg | Freq: Once | INTRAVENOUS | Status: AC
  Administered 2011-09-04: 0.4 mg via INTRAVENOUS

## 2011-09-04 MED ORDER — TECHNETIUM TC 99M TETROFOSMIN IV KIT
33.0000 | PACK | Freq: Once | INTRAVENOUS | Status: AC | PRN
  Administered 2011-09-04: 33 via INTRAVENOUS

## 2011-09-04 MED ORDER — TECHNETIUM TC 99M TETROFOSMIN IV KIT
11.0000 | PACK | Freq: Once | INTRAVENOUS | Status: AC | PRN
  Administered 2011-09-04: 11 via INTRAVENOUS

## 2011-09-04 NOTE — Progress Notes (Signed)
[facility] SITE 3 NUCLEAR MED [address] [place] [address] [phone]  Cardiology Nuclear Med Study  Henry [name] is a 61 y.o. male     MRN : [id number]     DOB: 1950-10-14  Procedure Date: 09/04/2011  Nuclear Med Background Indication for Stress Test:  Evaluation for Ischemia, Graft Patency and Stent Patency History:  '96 Anterior Wall Myocardial Infarction treated with Stents x 2 LAD,'00 CABG, 5-09 Myocardial Perfusion Study-Prior Anterior and Apical Infarct, (-) Ischemia EF=39%, 05-2011 Echo: EF=35-40%, and OSA with CPAP (new x 3 weeks) Cardiac Risk Factors: Carotid Disease, Hypertension, Lipids and NIDDM  Symptoms: Chest Pressure and Chest Tightness with/without exertion (last occurrence 4 days ago),  DOE, Fatigue, Fatigue with Exertion, Rapid HR and SOB   Nuclear Pre-Procedure Caffeine/Decaff Intake:  None NPO After: 10:00pm   Lungs:  clear O2 Sat: 95% on room air. IV 0.9% NS with Angio Cath:  20g  IV Site: R Antecubital  IV Started by:  Stanton Kidney, EMT-P  Chest Size (in):  52 Cup Size: n/a  Height: 5\' 8"  (1.727 m)  Weight:  270 lb (122.471 kg)  BMI:  Body mass index is 41.05 kg/(m^2). Tech Comments:  Metoprolol was taken today, per patient.    Nuclear Med Study 1 or 2 day study: 1 day  Stress Test Type:  Treadmill/Lexiscan  Reading MD: Dietrich Pates, MD  Order Authorizing Provider:  Olga Millers, MD  Resting Radionuclide: Technetium 47m Tetrofosmin  Resting Radionuclide Dose: 11.0 mCi   Stress Radionuclide:  Technetium 73m Tetrofosmin  Stress Radionuclide Dose: 33. mCi           Stress Protocol Rest HR: 69 Stress HR: 88  Rest BP: 109/67 Stress BP: 119/69  Exercise Time (min): 2:00 METS: n/a   Predicted Max HR: 160 bpm % Max HR: 55 bpm Rate Pressure Product: 21308   Dose of Adenosine (mg):  n/a Dose of Lexiscan: 0.4 mg  Dose of Atropine (mg): n/a Dose of Dobutamine: n/a mcg/kg/min (at max HR)  Stress Test Technologist: Irean Hong, RN  Nuclear Technologist:  Domenic Polite, CNMT     Rest Procedure:  Myocardial perfusion imaging was performed at rest 45 minutes following the intravenous administration of Technetium 20m Tetrofosmin. Rest ECG: NSR wit T wave inversion Inferior, anterolateral, PVC  Stress Procedure:  The patient received IV Lexiscan 0.4 mg over 15-seconds with concurrent low level exercise and then Technetium 1m Tetrofosmin was injected at 30-seconds while the patient continued walking one more minute. The EKG was non diagnostic due to baseline T wave changes. There was a rare PVC. Quantitative spect images were obtained after a 45-minute delay. Stress ECG: No significant change from baseline ECG  QPS Raw Data Images:  Extensive soft tissue attenuation (diaphragm, subcutaneous fat) surround heart. Stress Images:  Defect in the anterior wall (mid, distal); anteroseptal wall (base, mid, distal) inferoapical wall and apex.  Otherwise normal perfusion. Rest Images:  Comparison with the stress images reveals no significant change. Subtraction (SDS):  There is a fixed defect that is most consistent with a previous infarction. Transient Ischemic Dilatation (Normal <1.22):  1.03 Lung/Heart Ratio (Normal <0.45):  0.41  Quantitative Gated Spect Images QGS EDV:  188 ml QGS ESV:  111 ml  Impression Exercise Capacity:  Lexiscan with low level exercise. BP Response:  Normal blood pressure response. Clinical Symptoms:  Mild chest pain/dyspnea. ECG Impression:  No significant ST segment change suggestive of ischemia. Comparison with Prior Nuclear Study:  By report no significant change.  Overall Impression:  Medium to large area of scar in the anterior/anteroseptal and apical walls.  No ischemia.  LV Ejection Fraction: 41%.  LV Wall Motion:  Hypokinesis of the anteroseptal, anterior , distal inferor walls.

## 2011-09-05 ENCOUNTER — Encounter (HOSPITAL_COMMUNITY): Payer: Self-pay | Admitting: Emergency Medicine

## 2011-09-05 ENCOUNTER — Emergency Department (HOSPITAL_COMMUNITY)
Admission: EM | Admit: 2011-09-05 | Discharge: 2011-09-05 | Disposition: A | Discharge status: Home / Self Care | Payer: Federal, State, Local not specified - PPO | Attending: Emergency Medicine | Admitting: Emergency Medicine

## 2011-09-05 DIAGNOSIS — E119 Type 2 diabetes mellitus without complications: Secondary | ICD-10-CM | POA: Insufficient documentation

## 2011-09-05 DIAGNOSIS — G20 Parkinson's disease: Secondary | ICD-10-CM | POA: Insufficient documentation

## 2011-09-05 DIAGNOSIS — I10 Essential (primary) hypertension: Secondary | ICD-10-CM | POA: Insufficient documentation

## 2011-09-05 DIAGNOSIS — G20A1 Parkinson's disease without dyskinesia, without mention of fluctuations: Secondary | ICD-10-CM | POA: Insufficient documentation

## 2011-09-05 DIAGNOSIS — Z951 Presence of aortocoronary bypass graft: Secondary | ICD-10-CM | POA: Insufficient documentation

## 2011-09-05 DIAGNOSIS — S0180XA Unspecified open wound of other part of head, initial encounter: Secondary | ICD-10-CM | POA: Insufficient documentation

## 2011-09-05 DIAGNOSIS — S01111A Laceration without foreign body of right eyelid and periocular area, initial encounter: Secondary | ICD-10-CM

## 2011-09-05 DIAGNOSIS — I2510 Atherosclerotic heart disease of native coronary artery without angina pectoris: Secondary | ICD-10-CM | POA: Insufficient documentation

## 2011-09-05 DIAGNOSIS — Z833 Family history of diabetes mellitus: Secondary | ICD-10-CM | POA: Insufficient documentation

## 2011-09-05 DIAGNOSIS — Z8261 Family history of arthritis: Secondary | ICD-10-CM | POA: Insufficient documentation

## 2011-09-05 DIAGNOSIS — Z880 Allergy status to penicillin: Secondary | ICD-10-CM | POA: Insufficient documentation

## 2011-09-05 DIAGNOSIS — W2209XA Striking against other stationary object, initial encounter: Secondary | ICD-10-CM | POA: Insufficient documentation

## 2011-09-05 DIAGNOSIS — I252 Old myocardial infarction: Secondary | ICD-10-CM | POA: Insufficient documentation

## 2011-09-05 DIAGNOSIS — Z8489 Family history of other specified conditions: Secondary | ICD-10-CM | POA: Insufficient documentation

## 2011-09-05 NOTE — ED Provider Notes (Signed)
Medical screening examination/treatment/procedure(s) were performed by non-physician practitioner and as supervising physician I was immediately available for consultation/collaboration.  Synia Douglass, MD 09/05/11 0705 

## 2011-09-05 NOTE — ED Provider Notes (Signed)
History     CSN: [id number]  Arrival date & time 09/05/11  0304   First MD Initiated Contact with Patient 09/05/11 0321      Chief Complaint  Patient presents with  . Facial Laceration   HPI  History provided by the patient. Patient is a 61 year old male with history of hypertension, diabetes, CAD, and Parkinson's disease who presents with laceration to right for head and eyebrow. Patient reports that he was asleep and believes that he will lower his sleep hitting his head and face against the night stand. This woke patient up. Patient did not fall from bed. He does report having laceration with bleeding from right forehead area. He denies having any significant headache at this time. Patient does report some pains around laceration. Denies any eye pain or change in vision. Patient denies any confusion, changes in weakness or numbness. Patient has no other complaints. Patient reports being up-to-date on tetanus shot within the last 5 years.    Past Medical History  Diagnosis Date  . Cardiomyopathy   . CAD (coronary artery disease)   . HTN (hypertension)   . MI (myocardial infarction)   . Sleep apnea   . GERD (gastroesophageal reflux disease)   . Hyperthyroidism   . DM (diabetes mellitus)   . Hyperplasia, prostate   . Benign neoplasm of colon   . Nephrolithiasis   . Ventral hernia   . HLD (hyperlipidemia)   . Parkinson disease     Past Surgical History  Procedure Date  . Coronary artery bypass graft 2000    Tressie Stalker, MD  . Coronary stent placement 1998  . Median sternotomy 2000    Family History  Problem Relation Age of Onset  . Peripheral vascular disease    . Arthritis    . Diabetes      History  Substance Use Topics  . Smoking status: Never Smoker   . Smokeless tobacco: Never Used  . Alcohol Use: No      Review of Systems  HENT: Negative for neck pain.   Eyes: Negative for visual disturbance.  Neurological: Negative for dizziness, light-headedness  and headaches.    Allergies  Penicillins  Home Medications   Current Outpatient Rx  Name Route Sig Dispense Refill  . ASPIRIN 81 MG PO TABS Oral Take 81 mg by mouth daily.      . CRESTOR 20 MG PO TABS  TAKE 1 TABLET EVERY MORNING 30 tablet 2    **LABS DUE 08/2011**  . FUROSEMIDE 80 MG PO TABS  TAKE 1 TABLET (80 MG TOTAL) BY MOUTH AS DIRECTED. 1 BY MOUTH IN THE AM, 1/2 BY MOUTH IN THE PM 45 tablet 5  . GLIMEPIRIDE 2 MG PO TABS  TAKE 1 TABLET EVERY MORNING ,AND 1/2 TABLET WITH EVENING MEAL 135 tablet 1  . JANUMET 50-1000 MG PO TABS  TAKE 1 TABLET BY MOUTH TWICE DAILY WITH 2 LARGEST MEALS 60 tablet 0    **LABS DUE 08/2011**  . LOSARTAN POTASSIUM 50 MG PO TABS  TAKE 1 TABLET (50 MG TOTAL) BY MOUTH DAILY. 30 tablet 3  . METHIMAZOLE 10 MG PO TABS  TAKE 1 TABLET EVERY DAY 90 tablet 3  . METOPROLOL SUCCINATE ER 50 MG PO TB24 Oral Take 1 tablet (50 mg total) by mouth daily. 30 tablet 11  . POTASSIUM CHLORIDE CRYS ER 10 MEQ PO TBCR  3 by mouth once daily 90 tablet 11  . PRAMIPEXOLE DIHYDROCHLORIDE 0.5 MG PO TABS Oral Take 0.5  mg by mouth 3 (three) times daily.      . TRAMADOL HCL 50 MG PO TABS  TAKE 1 TABLET BY MOUTH EVERY 6 HOURS AS NEEDED 60 tablet 0  . TRAVOPROST 0.004 % OP SOLN Right Eye Place 1 drop into the right eye at bedtime.      . TRIHEXYPHENIDYL HCL 2 MG PO TABS Oral Take 2 mg by mouth 3 (three) times daily with meals.        BP 125/78  Pulse 78  Temp 97.5 F (36.4 C) (Oral)  Resp 18  SpO2 95%  Physical Exam  Nursing note and vitals reviewed. Constitutional: He is oriented to person, place, and time. He appears well-developed and well-nourished. No distress.  HENT:  Head: Normocephalic.       6 cm laceration above right eyebrow. No battle sign or raccoon eyes  Eyes: Conjunctivae and EOM are normal. Pupils are equal, round, and reactive to light.  Neck: Normal range of motion. Neck supple.       No cervical midline tenderness  Cardiovascular: Normal rate and regular  rhythm.   Pulmonary/Chest: Effort normal and breath sounds normal. No respiratory distress. He has no wheezes. He has no rales.  Abdominal: Soft. There is no tenderness.  Neurological: He is alert and oriented to person, place, and time.       Strength equal bilaterally. Normal sensations distally.  Skin: Skin is warm.  Psychiatric: He has a normal mood and affect. His behavior is normal.    ED Course  Procedures   LACERATION REPAIR Performed by: Angus Seller Authorized by: Angus Seller Consent: Verbal consent obtained. Risks and benefits: risks, benefits and alternatives were discussed Consent given by: patient Patient identity confirmed: provided demographic data Prepped and Draped in normal sterile fashion Wound explored  Laceration Location: Right eyebrow  Laceration Length: 6 cm  No Foreign Bodies seen or palpated  Anesthesia: local infiltration  Local anesthetic: lidocaine 2% with epinephrine  Anesthetic total: 4 ml  Irrigation method: syringe Amount of cleaning: standard  Skin closure: Skin with 6-0 Prolene   Number of sutures: 8   Technique: Simple interrupted   Patient tolerance: Patient tolerated the procedure well with no immediate complications.      1. Laceration of eyebrow, right       MDM  3:30 AM patient seen and evaluated. Patient with no LOC. Laceration above right eyebrow. No other signs of injury. Patient did not fall to the ground. Patient acting and behaving normally per baseline.        Angus Seller, [place] 09/05/11 [phone]

## 2011-09-05 NOTE — ED Notes (Addendum)
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

## 2011-09-06 ENCOUNTER — Telehealth: Payer: Self-pay | Admitting: Cardiology

## 2011-09-06 MED ORDER — NITROGLYCERIN 0.4 MG SL SUBL: 0.4000 mg | SUBLINGUAL_TABLET | SUBLINGUAL | Status: DC | PRN

## 2011-09-06 NOTE — Telephone Encounter (Signed)
Spoke with pt wife, aware of nuclear results. 

## 2011-09-06 NOTE — Telephone Encounter (Signed)
New Problem:    Patient called in returning your call. Please call back. 

## 2011-09-07 DIAGNOSIS — M5137 Other intervertebral disc degeneration, lumbosacral region: Secondary | ICD-10-CM | POA: Diagnosis not present

## 2011-09-07 DIAGNOSIS — IMO0002 Reserved for concepts with insufficient information to code with codable children: Secondary | ICD-10-CM | POA: Diagnosis not present

## 2011-09-07 DIAGNOSIS — M47817 Spondylosis without myelopathy or radiculopathy, lumbosacral region: Secondary | ICD-10-CM | POA: Diagnosis not present

## 2011-09-13 ENCOUNTER — Other Ambulatory Visit (INDEPENDENT_AMBULATORY_CARE_PROVIDER_SITE_OTHER): Payer: Federal, State, Local not specified - PPO

## 2011-09-13 ENCOUNTER — Encounter: Payer: Self-pay | Admitting: Endocrinology

## 2011-09-13 ENCOUNTER — Ambulatory Visit (INDEPENDENT_AMBULATORY_CARE_PROVIDER_SITE_OTHER): Payer: Federal, State, Local not specified - PPO | Admitting: Endocrinology

## 2011-09-13 VITALS — BP 102/62 | HR 67 | Temp 97.0°F | Ht 68.0 in | Wt 276.0 lb

## 2011-09-13 DIAGNOSIS — E0590 Thyrotoxicosis, unspecified without thyrotoxic crisis or storm: Secondary | ICD-10-CM

## 2011-09-13 LAB — TSH: TSH: 10.42 u[IU]/mL — ABNORMAL HIGH (ref 0.35–5.50)

## 2011-09-13 MED ORDER — LEVOTHYROXINE SODIUM 100 MCG PO TABS: 100.0000 ug | ORAL_TABLET | Freq: Every day | ORAL | Status: DC

## 2011-09-13 NOTE — Patient Instructions (Addendum)
blood tests are being requested for you today.  You will receive a letter with results. Stop taking the methimazole Based on today's blood test results, i will probably prescribe for you a thyroid hormone pill.

## 2011-09-13 NOTE — Progress Notes (Signed)
Subjective:    Patient ID: Henry [name], male    DOB: 06-May-1950, 61 y.o.   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  He is on tapazole for more prompt improvement.  He has fatigue.  He is overdue for f/u.   He had sutures placed at the right frontal area 9 days ago Past Medical History  Diagnosis Date  . Cardiomyopathy   . CAD (coronary artery disease)   . HTN (hypertension)   . MI (myocardial infarction)   . Sleep apnea   . GERD (gastroesophageal reflux disease)   . Hyperthyroidism   . DM (diabetes mellitus)   . Hyperplasia, prostate   . Benign neoplasm of colon   . Nephrolithiasis   . Ventral hernia   . HLD (hyperlipidemia)   . Parkinson disease     Past Surgical History  Procedure Date  . Coronary artery bypass graft 2000    Tressie Stalker, MD  . Coronary stent placement 1998  . Median sternotomy 2000    History   Social History  . Marital Status: Married    Spouse Name: N/A    Number of Children: N/A  . Years of Education: N/A   Occupational History  . Not on file.   Social History Main Topics  . Smoking status: Never Smoker   . Smokeless tobacco: Never Used  . Alcohol Use: No  . Drug Use: No  . Sexually Active: Not on file   Other Topics Concern  . Not on file   Social History Narrative  . No narrative on file    Current Outpatient Prescriptions on File Prior to Visit  Medication Sig Dispense Refill  . aspirin 81 MG tablet Take 81 mg by mouth daily.        . clonazePAM (KLONOPIN) 0.5 MG tablet Take 0.25 mg by mouth at bedtime.       . CRESTOR 20 MG tablet TAKE 1 TABLET EVERY MORNING  30 tablet  2  . furosemide (LASIX) 80 MG tablet TAKE 1 TABLET (80 MG TOTAL) BY MOUTH AS DIRECTED. 1 BY MOUTH IN THE AM, 1/2 BY MOUTH IN THE PM  45 tablet  5  . glimepiride (AMARYL) 2 MG tablet TAKE 1 TABLET EVERY MORNING ,AND 1/2 TABLET WITH EVENING MEAL  135 tablet  1  . JANUMET 50-1000 MG per tablet  TAKE 1 TABLET BY MOUTH TWICE DAILY WITH 2 LARGEST MEALS  60 tablet  0  . losartan (COZAAR) 50 MG tablet TAKE 1 TABLET (50 MG TOTAL) BY MOUTH DAILY.  30 tablet  3  . metoprolol (TOPROL-XL) 50 MG 24 hr tablet Take 1 tablet (50 mg total) by mouth daily.  30 tablet  11  . nitroGLYCERIN (NITROSTAT) 0.4 MG SL tablet Place 1 tablet (0.4 mg total) under the tongue every 5 (five) minutes as needed for chest pain.  25 tablet  12  . potassium chloride (KLOR-CON M10) 10 MEQ tablet 3 by mouth once daily  90 tablet  11  . pramipexole (MIRAPEX) 0.5 MG tablet Take 0.5 mg by mouth 3 (three) times daily.        . traMADol (ULTRAM) 50 MG tablet TAKE 1 TABLET BY MOUTH EVERY 6 HOURS AS NEEDED  60 tablet  0  . travoprost, benzalkonium, (TRAVATAN) 0.004 % ophthalmic solution Place 1 drop into the right eye at bedtime.        . trihexyphenidyl (ARTANE)  2 MG tablet Take 2 mg by mouth 3 (three) times daily with meals.        Marland Kitchen levothyroxine (SYNTHROID, LEVOTHROID) 100 MCG tablet Take 1 tablet (100 mcg total) by mouth daily.  30 tablet  1    Allergies  Allergen Reactions  . Penicillins     rash    Family History  Problem Relation Age of Onset  . Peripheral vascular disease    . Arthritis    . Diabetes      BP 102/62  Pulse 67  Temp 97 F (36.1 C) (Oral)  Ht 5\' 8"  (1.727 m)  Wt 276 lb (125.193 kg)  BMI 41.97 kg/m2  SpO2 95%  Review of Systems Denies fever    Objective:   Physical Exam VITAL SIGNS:  See vs page. GENERAL: no distress. Voice is hoarse. NECK: There is no palpable thyroid enlargement.  No thyroid nodule is palpable.  No palpable lymphadenopathy at the anterior neck.   Right forehead: transverse 4 cm laceration, closed.  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  Sutures are removed Noncompliance with f/u.  Therefore, i can't tell what the  reason for the hypothyroidism is.

## 2011-09-14 DIAGNOSIS — G2581 Restless legs syndrome: Secondary | ICD-10-CM | POA: Diagnosis not present

## 2011-09-14 DIAGNOSIS — G4733 Obstructive sleep apnea (adult) (pediatric): Secondary | ICD-10-CM | POA: Diagnosis not present

## 2011-09-14 DIAGNOSIS — G4752 REM sleep behavior disorder: Secondary | ICD-10-CM | POA: Diagnosis not present

## 2011-09-20 DIAGNOSIS — G20 Parkinson's disease: Secondary | ICD-10-CM | POA: Diagnosis not present

## 2011-09-23 ENCOUNTER — Other Ambulatory Visit: Payer: Self-pay | Admitting: Internal Medicine

## 2011-09-25 NOTE — Telephone Encounter (Signed)
a1C 250.00, LIPID/HEP 272.4/995.20

## 2011-09-26 ENCOUNTER — Other Ambulatory Visit: Payer: Self-pay | Admitting: Internal Medicine

## 2011-09-28 ENCOUNTER — Other Ambulatory Visit: Payer: Self-pay | Admitting: General Practice

## 2011-09-28 MED ORDER — TRAMADOL HCL 50 MG PO TABS: 50.0000 mg | ORAL_TABLET | Freq: Four times a day (QID) | ORAL | Status: DC | PRN

## 2011-10-16 ENCOUNTER — Ambulatory Visit (INDEPENDENT_AMBULATORY_CARE_PROVIDER_SITE_OTHER): Payer: Federal, State, Local not specified - PPO | Admitting: Endocrinology

## 2011-10-16 VITALS — BP 112/66 | HR 72 | Temp 97.9°F | Resp 17 | Wt 276.5 lb

## 2011-10-16 DIAGNOSIS — E890 Postprocedural hypothyroidism: Secondary | ICD-10-CM | POA: Diagnosis not present

## 2011-10-16 LAB — T4, FREE: Free T4: 0.91 ng/dL (ref 0.80–1.80)

## 2011-10-16 MED ORDER — LEVOTHYROXINE SODIUM 125 MCG PO TABS: 125.0000 ug | ORAL_TABLET | Freq: Every day | ORAL | Status: DC

## 2011-10-16 NOTE — Progress Notes (Signed)
  Subjective:    Patient ID: Henry [name], male    DOB: [birth year], 61 y.o.   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  He started on synthroid 1 month ago.   Review of Systems     Objective:   Physical Exam        Assessment & Plan:

## 2011-10-16 NOTE — Patient Instructions (Addendum)
blood tests are being requested for you today.  You will be contacted with results. Based on the results, we may need to increase the strength of your thyroid pill.   Please come back for a follow-up appointment for 1 month.

## 2011-10-16 NOTE — Progress Notes (Signed)
  Subjective:    Patient ID: Henry [name], male    DOB: 10/05/1950, 61 y.o.   MRN: [id number]  HPI    Review of Systems Denies weight change    Objective:   Physical Exam VITAL SIGNS:  See vs page GENERAL: no distress NECK: There is no palpable thyroid enlargement.  No thyroid nodule is palpable.  No palpable lymphadenopathy at the anterior neck.        Assessment & Plan:  Post-i-131 hypothyroidism, prob needs increased rx

## 2011-10-16 NOTE — Addendum Note (Signed)
Addended by: Romero Belling on: 10/16/2011 02:40 PM   Modules accepted: Orders

## 2011-10-17 ENCOUNTER — Other Ambulatory Visit: Payer: Self-pay

## 2011-10-17 MED ORDER — ROSUVASTATIN CALCIUM 20 MG PO TABS: ORAL_TABLET | ORAL | Status: DC

## 2011-10-19 ENCOUNTER — Telehealth: Payer: Self-pay | Admitting: *Deleted

## 2011-10-19 NOTE — Telephone Encounter (Signed)
Pharmacy confirmed refill request was sent in error.

## 2011-10-22 ENCOUNTER — Other Ambulatory Visit: Payer: Self-pay | Admitting: Internal Medicine

## 2011-10-23 NOTE — Telephone Encounter (Signed)
A1C, 250.00, k+ (995.20, LOW POTASSIUM)

## 2011-10-25 DIAGNOSIS — M5137 Other intervertebral disc degeneration, lumbosacral region: Secondary | ICD-10-CM | POA: Diagnosis not present

## 2011-10-25 DIAGNOSIS — IMO0002 Reserved for concepts with insufficient information to code with codable children: Secondary | ICD-10-CM | POA: Diagnosis not present

## 2011-11-07 ENCOUNTER — Other Ambulatory Visit (INDEPENDENT_AMBULATORY_CARE_PROVIDER_SITE_OTHER): Payer: Federal, State, Local not specified - PPO

## 2011-11-07 ENCOUNTER — Ambulatory Visit (INDEPENDENT_AMBULATORY_CARE_PROVIDER_SITE_OTHER): Payer: Federal, State, Local not specified - PPO

## 2011-11-07 DIAGNOSIS — E876 Hypokalemia: Secondary | ICD-10-CM | POA: Diagnosis not present

## 2011-11-07 DIAGNOSIS — Z23 Encounter for immunization: Secondary | ICD-10-CM

## 2011-11-07 DIAGNOSIS — E119 Type 2 diabetes mellitus without complications: Secondary | ICD-10-CM

## 2011-11-15 ENCOUNTER — Ambulatory Visit (INDEPENDENT_AMBULATORY_CARE_PROVIDER_SITE_OTHER): Payer: Federal, State, Local not specified - PPO | Admitting: Internal Medicine

## 2011-11-15 VITALS — BP 126/72 | HR 71 | Wt 273.8 lb

## 2011-11-15 DIAGNOSIS — IMO0001 Reserved for inherently not codable concepts without codable children: Secondary | ICD-10-CM

## 2011-11-15 DIAGNOSIS — J209 Acute bronchitis, unspecified: Secondary | ICD-10-CM | POA: Diagnosis not present

## 2011-11-15 MED ORDER — AZITHROMYCIN 250 MG PO TABS: ORAL_TABLET | ORAL | Status: DC

## 2011-11-15 MED ORDER — FLUTICASONE-SALMETEROL 250-50 MCG/DOSE IN AEPB: 1.0000 | INHALATION_SPRAY | Freq: Two times a day (BID) | RESPIRATORY_TRACT | Status: DC

## 2011-11-15 NOTE — Patient Instructions (Addendum)
Advair one inhalation every 12 hours; gargle and spit after use.  Eat a low-fat diet with lots of fruits and vegetables, up to 7-9 servings per day. Consume less than 40 (ZERO preferable)  grams of sugar per day from foods & drinks with High Fructose Corn Syrup (HFCS) sugar as #1,2,3 or # 4 on label.Whole Foods, [employer] & [employer] do not carry products with HFCS. Follow a  low carb nutrition program such as [place] or [employer]  to prevent Diabetes progression . White carbohydrates (potatoes, rice, bread, and pasta) have a high spike of sugar and a high load of sugar. For example a  baked potato has a cup of sugar and a  french fry  2 teaspoons of sugar. Yams, wild  rice, whole grained bread &  wheat pasta have been much lower spike and load of  sugar. Portions should be the size of a deck of cards or your palm.  Please schedule A1c & urine microalbumin in 4 months after nutritional  interventions as discussed. PLEASE BRING THESE INSTRUCTIONS TO FOLLOW UP  LAB APPOINTMENT.This will guarantee correct labs are drawn, eliminating need for repeat blood sampling ( needle sticks ! ). Diagnoses /Codes: 250.02

## 2011-11-15 NOTE — Progress Notes (Signed)
  Subjective:    Patient ID: Henry [name], male    DOB: 1950-07-20, 61 y.o.   MRN: [id number]  HPI The patient is here for followup of diabetes The most recent A1c  11/07/11  was 8% , which correlates to an average sugar of  183 , and long-term risk of  60% . Fasting blood sugar range 140s-160s .  Highest two-hour postprandial glucose is 225 . Hypoglycemia X 1 with glucose of 66 after 6 hr fast Last ophthalmologic examination 11/5/3   revealed no retinopathy. No active podiatry assessment on record. Diet is modified low sugar  . Exercise no due to back pain .  Marland Kitchen There is medical compliance with the statin. Blood pressure  is  not monitored . There is medical compliance with antihypertensive medications  1 week ago he felt as if he were developing a "cold" associated with head and chest congestion. He denied fever, chills, or sweats. He also denied frontal headache, facial pain, or nasal purulence. He did have cough with whitish sputum. He noted positional wheezing and shortness of breath in bed. He took Robitussin and Benadryl with partial response.  He has been using his CPAP and is resting much better. He will start water aerobics for his back discomfort        Review of Systems Eyes: No  blurred vision, double vision, or loss of vision Cardiovascular: no chest pain, palpitations, racing, irregular rhythm, claudication, or edema  Dermatologic: No nonhealing lesions of skin Neurologic: No  limb   numbness or tingling Endocrine: No  excessive thirst, excessive hunger, excessive urination       Objective:   Physical Exam General appearance:well nourished; no acute distress or increased work of breathing is present.  No  lymphadenopathy about the head, neck, or axilla noted. Central weight gain  Eyes: No conjunctival inflammation or lid edema is present. There is no scleral icterus. Ptosis OD  Ears:  External ear exam shows no significant lesions or deformities.  Otoscopic  examination reveals clear canals, tympanic membranes are intact bilaterally without bulging, retraction, inflammation or discharge.  Nose:  External nasal examination shows no deformity or inflammation. Nasal mucosa are pink and moist without lesions or exudates. No septal dislocation or deviation.No obstruction to airflow.   Oral exam: Dental hygiene is good; lips and gums are healthy appearing.There is no oropharyngeal erythema or exudate noted.   Neck:  No deformities, masses, or tenderness noted.   Shunt R neck   Heart:  Normal rate and regular rhythm. S1 and S2 normal without gallop, murmur, click, rub or other extra sounds.   Lungs:Chest clear to auscultation; no wheezes, rhonchi,rales ,or rubs present.No increased work of breathing.    Extremities:  No cyanosis, edema, or clubbing  noted    Skin: Warm & dry          Assessment & Plan:  #1 diabetes; control is barely at goal. Nutritional interventions discussed  #2 acute bronchitis  Plan: See orders and recommendations

## 2011-11-19 ENCOUNTER — Other Ambulatory Visit: Payer: Self-pay | Admitting: Internal Medicine

## 2011-11-19 ENCOUNTER — Other Ambulatory Visit: Payer: Self-pay | Admitting: Cardiology

## 2011-11-20 ENCOUNTER — Ambulatory Visit (INDEPENDENT_AMBULATORY_CARE_PROVIDER_SITE_OTHER): Payer: Federal, State, Local not specified - PPO | Admitting: Internal Medicine

## 2011-11-20 ENCOUNTER — Ambulatory Visit: Payer: Federal, State, Local not specified - PPO | Admitting: Endocrinology

## 2011-11-20 ENCOUNTER — Encounter: Payer: Self-pay | Admitting: Internal Medicine

## 2011-11-20 VITALS — Wt 275.1 lb

## 2011-11-20 DIAGNOSIS — E0590 Thyrotoxicosis, unspecified without thyrotoxic crisis or storm: Secondary | ICD-10-CM

## 2011-11-20 LAB — TSH: TSH: 8.127 u[IU]/mL — ABNORMAL HIGH (ref 0.350–4.500)

## 2011-11-20 NOTE — Patient Instructions (Addendum)
We ordered labs today and will let you know the results ASAP. Please take your thyroid medication >30 min before breakfast, separated by >4 hours from acid reflux medication, calcium, iron, multivitamins We will see you back in 3 months.

## 2011-11-20 NOTE — Progress Notes (Signed)
Subjective:     Patient ID: Henry [name], male   DOB: [birth year], 61 y.o.   MRN: [id number]  HPI Pt is a pt of Dr. George Hugh, returning for f/u for postablative hypothyroidism. He was treated 5 months ago with I-131 ablation for hyperthyroidism due to Graves Ds. He started on Synthroid 2 months ago. Last visit with Dr. Everardo All was 1 mo ago: Lab Results  Component Value Date   TSH 15.043* 10/16/2011  At that time, his Synthroid was increased to 125 mcg daily, up from 100 mcg daily. He takes it at night when going to bed, about 4h post dinner.  He feels well, but c/o back pain, for which he is taking spine injections, last 3 mo ago. He has a cough and feeling under the weather and has a raspy voice. Denies palpitations, tremors, weight loss, fatigue, hyperdefecation, rashes.   Past Medical History  Diagnosis Date  . Cardiomyopathy   . CAD (coronary artery disease)   . HTN (hypertension)   . MI (myocardial infarction)   . Sleep apnea   . GERD (gastroesophageal reflux disease)   . Hyperthyroidism   . DM (diabetes mellitus)   . Hyperplasia, prostate   . Benign neoplasm of colon   . Nephrolithiasis   . Ventral hernia   . HLD (hyperlipidemia)   . Parkinson disease     Past Surgical History  Procedure Date  . Coronary artery bypass graft 2000    Tressie Stalker, MD  . Coronary stent placement 1998  . Median sternotomy 2000    History   Social History  . Marital Status: Married    Spouse Name: N/A    Number of Children: N/A  . Years of Education: N/A   Occupational History  . Not on file.   Social History Main Topics  . Smoking status: Never Smoker   . Smokeless tobacco: Never Used  . Alcohol Use: No  . Drug Use: No  . Sexually Active: Not on file   Other Topics Concern  . Not on file   Social History Narrative  . No narrative on file    Current Outpatient Prescriptions on File Prior to Visit  Medication Sig Dispense Refill  . AMBULATORY NON FORMULARY  MEDICATION Medication Name: CPAP nightly, Dr.Love      . aspirin 81 MG tablet Take 81 mg by mouth daily.        Marland Kitchen azithromycin (ZITHROMAX Z-PAK) 250 MG tablet 2 day 1, then 1 qd  6 each  0  . clonazePAM (KLONOPIN) 0.5 MG tablet Take 0.25 mg by mouth at bedtime.       . Fluticasone-Salmeterol (ADVAIR DISKUS) 250-50 MCG/DOSE AEPB Inhale 1 puff into the lungs 2 (two) times daily.  14 each  0  . furosemide (LASIX) 80 MG tablet TAKE 1 TABLET (80 MG TOTAL) BY MOUTH AS DIRECTED. 1 BY MOUTH IN THE AM, 1/2 BY MOUTH IN THE PM  45 tablet  5  . glimepiride (AMARYL) 2 MG tablet TAKE 1 TABLET EVERY MORNING ,AND 1/2 TABLET WITH EVENING MEAL  135 tablet  1  . JANUMET 50-1000 MG per tablet TAKE 1 TABLET BY MOUTH TWICE A DAY WITH 2 LARGEST MEALS  60 tablet  0  . KLOR-CON M10 10 MEQ tablet TAKE 3 TABLETS BY MOUTH ONCE A DAY  90 tablet  0  . levothyroxine (SYNTHROID, LEVOTHROID) 125 MCG tablet Take 1 tablet (125 mcg total) by mouth daily.  30 tablet  1  . losartan (  COZAAR) 50 MG tablet TAKE 1 TABLET (50 MG TOTAL) BY MOUTH DAILY.  30 tablet  3  . metoprolol succinate (TOPROL-XL) 50 MG 24 hr tablet TAKE 1 TABLET BY MOUTH EVERY DAY  30 tablet  11  . Multiple Vitamins-Minerals (ANTIOXIDANT PO) Take by mouth daily.      . nitroGLYCERIN (NITROSTAT) 0.4 MG SL tablet Place 1 tablet (0.4 mg total) under the tongue every 5 (five) minutes as needed for chest pain.  25 tablet  12  . pramipexole (MIRAPEX) 0.5 MG tablet Take 0.5 mg by mouth 3 (three) times daily.        . rosuvastatin (CRESTOR) 20 MG tablet TAKE 1 TABLET EVERY MORNING **LABS DUE**  30 tablet  0  . Sennosides (LAXATIVE PILLS PO) Take by mouth every other day.      . traMADol (ULTRAM) 50 MG tablet TAKE 1 TABLET (50 MG TOTAL) BY MOUTH EVERY 6 (SIX) HOURS AS NEEDED FOR PAIN.  60 tablet  0  . travoprost, benzalkonium, (TRAVATAN) 0.004 % ophthalmic solution Place 1 drop into the right eye at bedtime.        . trihexyphenidyl (ARTANE) 2 MG tablet Take 2 mg by mouth 3  (three) times daily with meals.          Allergies  Allergen Reactions  . Penicillins     rash    Family History  Problem Relation Age of Onset  . Peripheral vascular disease    . Arthritis    . Diabetes     Review of Systems  Constitutional: Negative for diaphoresis, fatigue and unexpected weight change.  HENT: Negative for neck pain.   Eyes: Negative for visual disturbance.  Respiratory: Negative for shortness of breath.   Musculoskeletal: Positive for back pain and gait problem (because of back pain).  All other systems reviewed and are negative.  Also see HPI.     Objective:   Physical Exam  Nursing note and vitals reviewed. Constitutional: He is oriented to person, place, and time.       overweight  HENT:       No lid lag, stare, or exophthalmos  Eyes: Conjunctivae normal are normal. Pupils are equal, round, and reactive to light.  Neck: No thyromegaly present.  Cardiovascular: Normal rate and regular rhythm.  Exam reveals no gallop and no friction rub.   No murmur heard. Pulmonary/Chest:       CTA bilat.  Musculoskeletal: He exhibits no edema.  Neurological: He is alert and oriented to person, place, and time. He displays normal reflexes.  Skin: Skin is warm and dry. No erythema.  Psychiatric: He has a normal mood and affect.   VS were checked but not recorded in Epic.    Assessment:     1. Graves disease - I131 ablation 04/2011 -postablative hypothyroidism    Plan:     - pt appears euthyroid - we discussed proper intake of Levothyroxine, >30 min before b'fast, separated by >4h from antacids, calcium, iron, MVI - I wrote it on his medication list. Moved MVI at night rather than in am. - I will check a new TSH and fT4 - I will let him know the results of his tests - pt agrees to plan - we will see him back in 3 months  Office Visit on 11/20/2011  Component Date Value Range Status  . TSH 11/20/2011 8.127* 0.350 - 4.500 uIU/mL Final  . Free T4  11/20/2011 0.98  0.80 - 1.80 ng/dL Final  We will not change the dose of his Levothyroxine. I advised him to switch to am dosing and taking it on an empty stomach. Will repeat labs at next appt. In 3 months.

## 2011-11-21 ENCOUNTER — Encounter: Payer: Self-pay | Admitting: Internal Medicine

## 2011-11-21 DIAGNOSIS — IMO0002 Reserved for concepts with insufficient information to code with codable children: Secondary | ICD-10-CM | POA: Diagnosis not present

## 2011-11-21 DIAGNOSIS — M47817 Spondylosis without myelopathy or radiculopathy, lumbosacral region: Secondary | ICD-10-CM | POA: Diagnosis not present

## 2011-11-22 DIAGNOSIS — IMO0002 Reserved for concepts with insufficient information to code with codable children: Secondary | ICD-10-CM | POA: Diagnosis not present

## 2011-11-22 DIAGNOSIS — M47817 Spondylosis without myelopathy or radiculopathy, lumbosacral region: Secondary | ICD-10-CM | POA: Diagnosis not present

## 2011-11-26 ENCOUNTER — Other Ambulatory Visit: Payer: Self-pay | Admitting: Cardiology

## 2011-11-28 ENCOUNTER — Other Ambulatory Visit: Payer: Self-pay | Admitting: Internal Medicine

## 2011-11-29 NOTE — Telephone Encounter (Signed)
Rx sent.    MW 

## 2011-12-06 DIAGNOSIS — G4733 Obstructive sleep apnea (adult) (pediatric): Secondary | ICD-10-CM | POA: Diagnosis not present

## 2011-12-06 DIAGNOSIS — G20 Parkinson's disease: Secondary | ICD-10-CM | POA: Diagnosis not present

## 2011-12-06 DIAGNOSIS — G4752 REM sleep behavior disorder: Secondary | ICD-10-CM | POA: Diagnosis not present

## 2011-12-06 DIAGNOSIS — G2581 Restless legs syndrome: Secondary | ICD-10-CM | POA: Diagnosis not present

## 2011-12-11 DIAGNOSIS — L600 Ingrowing nail: Secondary | ICD-10-CM | POA: Diagnosis not present

## 2011-12-11 DIAGNOSIS — M7750 Other enthesopathy of unspecified foot: Secondary | ICD-10-CM | POA: Diagnosis not present

## 2011-12-16 ENCOUNTER — Other Ambulatory Visit: Payer: Self-pay | Admitting: Cardiology

## 2011-12-16 ENCOUNTER — Other Ambulatory Visit: Payer: Self-pay | Admitting: Internal Medicine

## 2011-12-23 ENCOUNTER — Other Ambulatory Visit: Payer: Self-pay | Admitting: Cardiology

## 2011-12-25 DIAGNOSIS — M5137 Other intervertebral disc degeneration, lumbosacral region: Secondary | ICD-10-CM | POA: Diagnosis not present

## 2011-12-25 DIAGNOSIS — M47817 Spondylosis without myelopathy or radiculopathy, lumbosacral region: Secondary | ICD-10-CM | POA: Diagnosis not present

## 2011-12-31 ENCOUNTER — Other Ambulatory Visit: Payer: Self-pay | Admitting: Internal Medicine

## 2012-01-02 DIAGNOSIS — M47817 Spondylosis without myelopathy or radiculopathy, lumbosacral region: Secondary | ICD-10-CM | POA: Diagnosis not present

## 2012-01-04 ENCOUNTER — Other Ambulatory Visit: Payer: Self-pay | Admitting: Internal Medicine

## 2012-01-04 NOTE — Telephone Encounter (Signed)
Patient aware rx and Controlled Substance Contract at the front desk for pick-up

## 2012-01-08 ENCOUNTER — Telehealth: Payer: Self-pay | Admitting: Internal Medicine

## 2012-01-08 ENCOUNTER — Other Ambulatory Visit: Payer: Self-pay | Admitting: Internal Medicine

## 2012-01-08 DIAGNOSIS — M2010 Hallux valgus (acquired), unspecified foot: Secondary | ICD-10-CM | POA: Diagnosis not present

## 2012-01-08 DIAGNOSIS — M7750 Other enthesopathy of unspecified foot: Secondary | ICD-10-CM | POA: Diagnosis not present

## 2012-01-08 NOTE — Telephone Encounter (Signed)
Pt states he is sick again from what he last time he was where. He would like to have the same med called in for him (advir???) again.

## 2012-01-08 NOTE — Telephone Encounter (Signed)
No abx w/out appt.  Can schedule or wait until Hopp returns (tomorrow, i think)

## 2012-01-08 NOTE — Telephone Encounter (Signed)
Spoke with pt, he states he has head & chest congestion, cough, & sore throat. Pt states he came in back in November with the same symptoms & Dr. Alwyn Ren called in a z-pak. Pt states he would like the same sent in to pharmacy. Please advise.

## 2012-01-09 ENCOUNTER — Encounter: Payer: Self-pay | Admitting: Internal Medicine

## 2012-01-09 ENCOUNTER — Telehealth: Payer: Self-pay | Admitting: Endocrinology

## 2012-01-09 ENCOUNTER — Emergency Department (HOSPITAL_BASED_OUTPATIENT_CLINIC_OR_DEPARTMENT_OTHER)
Admission: EM | Admit: 2012-01-09 | Discharge: 2012-01-09 | Discharge status: Against Medical Advice | Payer: Federal, State, Local not specified - PPO | Attending: Emergency Medicine | Admitting: Emergency Medicine

## 2012-01-09 ENCOUNTER — Ambulatory Visit (INDEPENDENT_AMBULATORY_CARE_PROVIDER_SITE_OTHER): Payer: Federal, State, Local not specified - PPO | Admitting: Internal Medicine

## 2012-01-09 ENCOUNTER — Encounter (HOSPITAL_BASED_OUTPATIENT_CLINIC_OR_DEPARTMENT_OTHER): Payer: Self-pay | Admitting: *Deleted

## 2012-01-09 ENCOUNTER — Emergency Department (HOSPITAL_BASED_OUTPATIENT_CLINIC_OR_DEPARTMENT_OTHER): Payer: Federal, State, Local not specified - PPO

## 2012-01-09 VITALS — BP 110/80 | HR 72 | Temp 98.0°F | Wt 217.6 lb

## 2012-01-09 DIAGNOSIS — R05 Cough: Secondary | ICD-10-CM | POA: Insufficient documentation

## 2012-01-09 DIAGNOSIS — R062 Wheezing: Secondary | ICD-10-CM | POA: Insufficient documentation

## 2012-01-09 DIAGNOSIS — J9801 Acute bronchospasm: Secondary | ICD-10-CM

## 2012-01-09 DIAGNOSIS — R059 Cough, unspecified: Secondary | ICD-10-CM | POA: Diagnosis not present

## 2012-01-09 DIAGNOSIS — J209 Acute bronchitis, unspecified: Secondary | ICD-10-CM | POA: Diagnosis not present

## 2012-01-09 MED ORDER — LEVOTHYROXINE SODIUM 125 MCG PO TABS: 125.0000 ug | ORAL_TABLET | Freq: Every day | ORAL | Status: DC

## 2012-01-09 MED ORDER — FLUTICASONE-SALMETEROL 250-50 MCG/DOSE IN AEPB: 1.0000 | INHALATION_SPRAY | Freq: Two times a day (BID) | RESPIRATORY_TRACT | Status: DC

## 2012-01-09 MED ORDER — ALBUTEROL SULFATE HFA 108 (90 BASE) MCG/ACT IN AERS: 2.0000 | INHALATION_SPRAY | Freq: Four times a day (QID) | RESPIRATORY_TRACT | Status: DC | PRN

## 2012-01-09 MED ORDER — DOXYCYCLINE HYCLATE 100 MG PO TABS: 100.0000 mg | ORAL_TABLET | Freq: Two times a day (BID) | ORAL | Status: DC

## 2012-01-09 NOTE — Patient Instructions (Addendum)
Get a chest XR at [facility], corner of [address] (10 minutes form here); they are open 24/7 [address]  [place], [place] [address] [phone]  ---- Doxycycline as prescribed Mucinex DM OTC as needed for cough Advair one puff twice a day Ventolin, a rescue inhaler, 2 puffs every 6 hours if you continue wheezing despite taking Advair. Please come back and see your PCP within a month for further evaluation of respiratory symptoms. If not improving in few days please let us know Call or go to the emergency room if worse.

## 2012-01-09 NOTE — Progress Notes (Signed)
  Subjective:    Patient ID: Henry [name], male    DOB: [birth year], 61 y.o.   MRN: [id number]  HPI Acute visit 60 year old gentleman with multiple medical problems seen today with a three-day history of sinus congestion, wheezing and cough. He had similar symptoms back in November, took a Zithromax and he felt better until recently. He was given a sample of Advair which he took with good results  Past Medical History  Diagnosis Date  . Cardiomyopathy   . CAD (coronary artery disease)   . HTN (hypertension)   . MI (myocardial infarction)   . Sleep apnea   . GERD (gastroesophageal reflux disease)   . Hyperthyroidism   . DM (diabetes mellitus)   . Hyperplasia, prostate   . Benign neoplasm of colon   . Nephrolithiasis   . Ventral hernia   . HLD (hyperlipidemia)   . Parkinson disease    Past Surgical History  Procedure Date  . Coronary artery bypass graft 2000    Tressie Stalker, MD  . Coronary stent placement 1998  . Median sternotomy 2000   Review of Systems Denies fever or chills He does have a small amount of white sputum production. No myalgias, nausea, vomiting, diarrhea.     Objective:   Physical Exam  General -- alert, well-developed, and overweight appearing. No apparent distress.  HEENT -- right TM obscured by wax, left tympanic membrane normal; no throat redness, face symmetric and not tender to palpation, nose slt  congested   Lungs --   no increased work of breathing, bilateral rhonchi and few wheezes noted, no crackles. Heart-- normal rate, regular rhythm, no murmur, and no gallop. Psych-- Cognition and judgment appear intact. Alert and cooperative with normal attention span and concentration.  not anxious appearing and not depressed appearing.      Assessment & Plan:   bronchitis, bronchospasm. 61 year old gentleman with multiple medical problems present with evidence of bronchitis. He also has bronchospasm. He was never a smoker, never been formally  diagnosed with asthma or emphysema however wife reports he gets   this type of episode 2-3 times a year. Plan: Doxycycline, Advair refill, chest x-ray, albuterol Also recommend to see PCP for eval, likely will need PFTs.

## 2012-01-09 NOTE — ED Notes (Signed)
Pt was seen by PCP and sent her for wheezing pt with URI sx x 3 days

## 2012-01-09 NOTE — ED Notes (Signed)
Patient was to have only outpatient chest xray today that was ordered by [facility]. Patient was outpatient xray only. Dismiss from ED

## 2012-01-09 NOTE — Telephone Encounter (Signed)
The patient is requesting refill of Levothyroxine.  The patient stated that the [facility] on [address] attempted multiple times to fax the request without success.  Please call the patient with update at [phone].

## 2012-01-09 NOTE — Telephone Encounter (Signed)
Spoke with patient, patient aware an appointment is necessary to rx ABX. Patient verbalized understanding and scheduled to see Dr.Paz at 3:30 pm today

## 2012-01-11 ENCOUNTER — Encounter: Payer: Self-pay | Admitting: Internal Medicine

## 2012-01-11 ENCOUNTER — Telehealth: Payer: Self-pay | Admitting: Internal Medicine

## 2012-01-11 NOTE — Telephone Encounter (Signed)
Patient aware of Dr.Paz's response

## 2012-01-11 NOTE — Telephone Encounter (Signed)
Advice patient, chest x-ray showed no pneumonia. Plan is the same (doxycycline, Advair et Karie Soda)

## 2012-01-11 NOTE — Telephone Encounter (Signed)
Patient calling to get the CXR results from 12/31.  Unable to find the report in EPIC. Please call patient with the results.

## 2012-01-11 NOTE — Telephone Encounter (Signed)
Patient seen Dr.Paz on 01/09/2012, Dr.Paz is listed as ordering MD on Xray. I seen report and informed patient once reviewed by MD we will mail report or call if instructed

## 2012-01-17 ENCOUNTER — Other Ambulatory Visit: Payer: Self-pay

## 2012-01-17 MED ORDER — LEVOTHYROXINE SODIUM 125 MCG PO TABS: 125.0000 ug | ORAL_TABLET | Freq: Every day | ORAL | Status: DC

## 2012-01-18 DIAGNOSIS — M47817 Spondylosis without myelopathy or radiculopathy, lumbosacral region: Secondary | ICD-10-CM | POA: Diagnosis not present

## 2012-01-28 ENCOUNTER — Other Ambulatory Visit: Payer: Self-pay | Admitting: Internal Medicine

## 2012-02-15 ENCOUNTER — Telehealth: Payer: Self-pay | Admitting: Internal Medicine

## 2012-02-15 NOTE — Telephone Encounter (Signed)
Refill: Tramadol hcl 50mg  tablet. Take 1 tablet by mouth every 6 hours as needed for pain. Qty 60. Last fill 01-05-12

## 2012-02-16 MED ORDER — TRAMADOL HCL 50 MG PO TABS: ORAL_TABLET | ORAL | Status: DC

## 2012-02-19 ENCOUNTER — Telehealth: Payer: Self-pay | Admitting: Internal Medicine

## 2012-02-19 DIAGNOSIS — T50995A Adverse effect of other drugs, medicaments and biological substances, initial encounter: Secondary | ICD-10-CM

## 2012-02-19 DIAGNOSIS — E785 Hyperlipidemia, unspecified: Secondary | ICD-10-CM

## 2012-02-19 MED ORDER — ROSUVASTATIN CALCIUM 20 MG PO TABS: ORAL_TABLET | ORAL | Status: DC

## 2012-02-19 NOTE — Telephone Encounter (Signed)
refill  CRESTOR 20 MG TAKE 1 TABLET EVERY MORNING #30, last fill 10.9.13

## 2012-02-20 ENCOUNTER — Ambulatory Visit (INDEPENDENT_AMBULATORY_CARE_PROVIDER_SITE_OTHER): Payer: Federal, State, Local not specified - PPO | Admitting: Endocrinology

## 2012-02-20 ENCOUNTER — Encounter: Payer: Self-pay | Admitting: Endocrinology

## 2012-02-20 VITALS — BP 124/80 | HR 77 | Wt 275.0 lb

## 2012-02-20 DIAGNOSIS — E0590 Thyrotoxicosis, unspecified without thyrotoxic crisis or storm: Secondary | ICD-10-CM | POA: Diagnosis not present

## 2012-02-20 DIAGNOSIS — E890 Postprocedural hypothyroidism: Secondary | ICD-10-CM

## 2012-02-20 LAB — TSH: TSH: 0.36 u[IU]/mL (ref 0.35–5.50)

## 2012-02-20 NOTE — Progress Notes (Signed)
Subjective:    Patient ID: Henry [name], male    DOB: 1950-10-14, 62 y.o.   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  At last ov, tsh was slightly elevated, so pt started taking it on an empty stomach.  pt states he feels well in general, except for intermittent sneezing.   Past Medical History  Diagnosis Date  . Cardiomyopathy   . CAD (coronary artery disease)   . HTN (hypertension)   . MI (myocardial infarction)   . Sleep apnea   . GERD (gastroesophageal reflux disease)   . Hyperthyroidism   . DM (diabetes mellitus)   . Hyperplasia, prostate   . Benign neoplasm of colon   . Nephrolithiasis   . Ventral hernia   . HLD (hyperlipidemia)   . Parkinson disease     Past Surgical History  Procedure Laterality Date  . Coronary artery bypass graft  2000    Tressie Stalker, MD  . Coronary stent placement  1998  . Median sternotomy  2000    History   Social History  . Marital Status: Married    Spouse Name: N/A    Number of Children: N/A  . Years of Education: N/A   Occupational History  . Not on file.   Social History Main Topics  . Smoking status: Never Smoker   . Smokeless tobacco: Never Used  . Alcohol Use: No  . Drug Use: No  . Sexually Active: Not on file   Other Topics Concern  . Not on file   Social History Narrative  . No narrative on file    Current Outpatient Prescriptions on File Prior to Visit  Medication Sig Dispense Refill  . albuterol (VENTOLIN HFA) 108 (90 BASE) MCG/ACT inhaler Inhale 2 puffs into the lungs every 6 (six) hours as needed for wheezing.  1 Inhaler  1  . AMBULATORY NON FORMULARY MEDICATION Medication Name: CPAP nightly, Dr.Love      . aspirin 81 MG tablet Take 81 mg by mouth daily.        . clonazePAM (KLONOPIN) 0.5 MG tablet Take 0.25 mg by mouth at bedtime.       Marland Kitchen doxycycline (VIBRA-TABS) 100 MG tablet Take 1 tablet (100 mg total) by mouth 2 (two) times daily.  14  tablet  0  . Fluticasone-Salmeterol (ADVAIR DISKUS) 250-50 MCG/DOSE AEPB Inhale 1 puff into the lungs 2 (two) times daily.  1 each  2  . furosemide (LASIX) 80 MG tablet TAKE 1 TABLET (80 MG TOTAL) BY MOUTH AS DIRECTED. 1 BY MOUTH IN THE AM, 1/2 BY MOUTH IN THE PM  45 tablet  5  . glimepiride (AMARYL) 2 MG tablet TAKE 1 TABLET BY MOUTH MORNING AND 1/2 IN THE EVENING WITH MEAL  45 tablet  1  . JANUMET 50-1000 MG per tablet TAKE 1 TABLET TWICE A DAY WITH 2 LARGEST MEALS  60 tablet  3  . KLOR-CON M10 10 MEQ tablet TAKE 3 TABLETS BY MOUTH DAILY  90 tablet  5  . levothyroxine (SYNTHROID, LEVOTHROID) 125 MCG tablet Take 1 tablet (125 mcg total) by mouth daily.  30 tablet  3  . losartan (COZAAR) 50 MG tablet TAKE 1 TABLET (50 MG TOTAL) BY MOUTH DAILY.  30 tablet  3  . metoprolol succinate (TOPROL-XL) 50 MG 24 hr tablet TAKE 1 TABLET BY MOUTH EVERY DAY  30 tablet  11  . Multiple Vitamins-Minerals (  ANTIOXIDANT PO) Take by mouth daily.      . nitroGLYCERIN (NITROSTAT) 0.4 MG SL tablet Place 1 tablet (0.4 mg total) under the tongue every 5 (five) minutes as needed for chest pain.  25 tablet  12  . pramipexole (MIRAPEX) 0.5 MG tablet Take 0.5 mg by mouth 3 (three) times daily.        . rosuvastatin (CRESTOR) 20 MG tablet LABS OVERDUE, 1 by mouth daily  15 tablet  0  . Sennosides (LAXATIVE PILLS PO) Take by mouth every other day.      . traMADol (ULTRAM) 50 MG tablet TAKE 1 TABLET BY MOUTH EVERY 6 HOURS AS NEEDED FOR PAIN  60 tablet  0  . travoprost, benzalkonium, (TRAVATAN) 0.004 % ophthalmic solution Place 1 drop into the right eye at bedtime.        . trihexyphenidyl (ARTANE) 2 MG tablet Take 2 mg by mouth 3 (three) times daily with meals.         No current facility-administered medications on file prior to visit.    Allergies  Allergen Reactions  . Penicillins     rash    Family History  Problem Relation Age of Onset  . Peripheral vascular disease    . Arthritis    . Diabetes      BP 124/80   Pulse 77  Wt 275 lb (124.739 kg)  BMI 41.82 kg/m2  SpO2 96%  Review of Systems Denies weight change.      Objective:   Physical Exam VITAL SIGNS:  See vs page GENERAL: no distress NECK: There is no palpable thyroid enlargement.  No thyroid nodule is palpable.  No palpable lymphadenopathy at the anterior neck.       Assessment & Plan:  Post-i-131 hypothyroidism, on rx

## 2012-02-20 NOTE — Patient Instructions (Addendum)
blood tests are being requested for you today.  We'll contact you with results. I would be happy to see you back here whenever you want.  Dr hopper will be happy to check your thyroid periodically.

## 2012-02-21 DIAGNOSIS — G20 Parkinson's disease: Secondary | ICD-10-CM | POA: Diagnosis not present

## 2012-02-26 DIAGNOSIS — L600 Ingrowing nail: Secondary | ICD-10-CM | POA: Diagnosis not present

## 2012-02-28 ENCOUNTER — Telehealth: Payer: Self-pay | Admitting: *Deleted

## 2012-02-28 ENCOUNTER — Other Ambulatory Visit (INDEPENDENT_AMBULATORY_CARE_PROVIDER_SITE_OTHER): Payer: Federal, State, Local not specified - PPO

## 2012-02-28 DIAGNOSIS — T50995A Adverse effect of other drugs, medicaments and biological substances, initial encounter: Secondary | ICD-10-CM | POA: Diagnosis not present

## 2012-02-28 DIAGNOSIS — E785 Hyperlipidemia, unspecified: Secondary | ICD-10-CM

## 2012-02-28 LAB — LIPID PANEL
LDL Cholesterol: 51 mg/dL (ref 0–99)
Total CHOL/HDL Ratio: 3
VLDL: 31.4 mg/dL (ref 0.0–40.0)

## 2012-02-28 LAB — HEPATIC FUNCTION PANEL
AST: 24 U/L (ref 0–37)
Alkaline Phosphatase: 58 U/L (ref 39–117)
Bilirubin, Direct: 0.1 mg/dL (ref 0.0–0.3)
Total Bilirubin: 0.5 mg/dL (ref 0.3–1.2)

## 2012-02-28 NOTE — Telephone Encounter (Signed)
Patient presented to office requesting samples of crestor. Henry Bible did go to lab for bloodwork that was overdue. Provided pt with 2 week sample of crestor.

## 2012-02-29 ENCOUNTER — Telehealth: Payer: Self-pay

## 2012-02-29 NOTE — Telephone Encounter (Signed)
Message copied by Maurice Small on Thu Feb 29, 2012  8:52 AM ------      Message from: Pecola Lawless      Created: Wed Feb 28, 2012  6:45 PM       By mistake lab results were filed before explanatory notes could be applied. Hardcopy will be mailed. ------

## 2012-02-29 NOTE — Telephone Encounter (Signed)
Notes on Hard Copy are as follow: Liver function: Normal Dramatically better (Lipid Panel), no change , recheck in 8-12 months Salmon, tuna, and flax seed oil will help increase HDL (good Cholesterol)

## 2012-03-02 ENCOUNTER — Other Ambulatory Visit: Payer: Self-pay | Admitting: Endocrinology

## 2012-03-09 ENCOUNTER — Other Ambulatory Visit: Payer: Self-pay | Admitting: Internal Medicine

## 2012-03-11 MED ORDER — ROSUVASTATIN CALCIUM 20 MG PO TABS: ORAL_TABLET | ORAL | Status: DC

## 2012-03-11 NOTE — Telephone Encounter (Signed)
Please also add refill crestor 20mg  tablets #15 take 1 tablet by mouth daily last fill 2.10.14

## 2012-03-28 ENCOUNTER — Other Ambulatory Visit: Payer: Self-pay | Admitting: Internal Medicine

## 2012-04-12 DIAGNOSIS — H524 Presbyopia: Secondary | ICD-10-CM | POA: Diagnosis not present

## 2012-04-12 DIAGNOSIS — H4030X0 Glaucoma secondary to eye trauma, unspecified eye, stage unspecified: Secondary | ICD-10-CM | POA: Diagnosis not present

## 2012-04-12 DIAGNOSIS — H40019 Open angle with borderline findings, low risk, unspecified eye: Secondary | ICD-10-CM | POA: Diagnosis not present

## 2012-04-12 DIAGNOSIS — S0590XA Unspecified injury of unspecified eye and orbit, initial encounter: Secondary | ICD-10-CM | POA: Diagnosis not present

## 2012-04-12 DIAGNOSIS — H409 Unspecified glaucoma: Secondary | ICD-10-CM | POA: Diagnosis not present

## 2012-04-12 DIAGNOSIS — H21559 Recession of chamber angle, unspecified eye: Secondary | ICD-10-CM | POA: Diagnosis not present

## 2012-04-15 ENCOUNTER — Other Ambulatory Visit: Payer: Self-pay | Admitting: Internal Medicine

## 2012-04-16 NOTE — Telephone Encounter (Signed)
A1c 250.02

## 2012-04-17 ENCOUNTER — Encounter: Payer: Self-pay | Admitting: Neurology

## 2012-04-17 ENCOUNTER — Ambulatory Visit (INDEPENDENT_AMBULATORY_CARE_PROVIDER_SITE_OTHER): Payer: Federal, State, Local not specified - PPO | Admitting: Neurology

## 2012-04-17 VITALS — BP 130/82 | HR 75 | Temp 98.0°F | Ht 70.0 in | Wt 273.0 lb

## 2012-04-17 DIAGNOSIS — G901 Familial dysautonomia [Riley-Day]: Secondary | ICD-10-CM

## 2012-04-17 DIAGNOSIS — Z9989 Dependence on other enabling machines and devices: Secondary | ICD-10-CM

## 2012-04-17 DIAGNOSIS — G20 Parkinson's disease: Secondary | ICD-10-CM

## 2012-04-17 DIAGNOSIS — G4733 Obstructive sleep apnea (adult) (pediatric): Secondary | ICD-10-CM | POA: Insufficient documentation

## 2012-04-17 DIAGNOSIS — G909 Disorder of the autonomic nervous system, unspecified: Secondary | ICD-10-CM | POA: Diagnosis not present

## 2012-04-17 DIAGNOSIS — I25810 Atherosclerosis of coronary artery bypass graft(s) without angina pectoris: Secondary | ICD-10-CM

## 2012-04-17 MED ORDER — SENNA-DOCUSATE SODIUM 8.6-50 MG PO TABS: 1.0000 | ORAL_TABLET | Freq: Every day | ORAL | Status: DC

## 2012-04-17 NOTE — Patient Instructions (Addendum)
1800 Calorie Diet for Diabetes Meal Planning The 1800 calorie diet is designed for eating up to 1800 calories each day. Following this diet and making healthy meal choices can help improve overall health. This diet controls blood sugar (glucose) levels and can also help lower blood pressure and cholesterol. SERVING SIZES Measuring foods and serving sizes helps to make sure you are getting the right amount of food. The list below tells how big or small some common serving sizes are:  1 oz.........4 stacked dice.  3 oz........Marland KitchenDeck of cards.  1 tsp.......Marland KitchenTip of little finger.  1 tbs......Marland KitchenMarland KitchenThumb.  2 tbs.......Marland KitchenGolf ball.   cup......Marland KitchenHalf of a fist.  1 cup.......Marland KitchenA fist. GUIDELINES FOR CHOOSING FOODS The goal of this diet is to eat a variety of foods and limit calories to 1800 each day. This can be done by choosing foods that are low in calories and fat. The diet also suggests eating small amounts of food frequently. Doing this helps control your blood glucose levels so they do not get too high or too low. Each meal or snack may include a protein food source to help you feel more satisfied and to stabilize your blood glucose. Try to eat about the same amount of food around the same time each day. This includes weekend days, travel days, and days off work. Space your meals about 4 to 5 hours apart and add a snack between them if you wish.  For example, a daily food plan could include breakfast, a morning snack, lunch, dinner, and an evening snack. Healthy meals and snacks include whole grains, vegetables, fruits, lean meats, poultry, fish, and dairy products. As you plan your meals, select a variety of foods. Choose from the bread and starch, vegetable, fruit, dairy, and meat/protein groups. Examples of foods from each group and their suggested serving sizes are listed below. Use measuring cups and spoons to become familiar with what a healthy portion looks like. Bread and Starch Each serving  equals 15 grams of carbohydrates.  1 slice bread.   bagel.   cup cold cereal (unsweetened).   cup hot cereal or mashed potatoes.  1 small potato (size of a computer mouse).   cup cooked pasta or rice.   English muffin.  1 cup broth-based soup.  3 cups of popcorn.  4 to 6 whole-wheat crackers.   cup cooked beans, peas, or corn. Vegetable Each serving equals 5 grams of carbohydrates.   cup cooked vegetables.  1 cup raw vegetables.   cup tomato or vegetable juice. Fruit Each serving equals 15 grams of carbohydrates.  1 small apple or orange.  1 cup watermelon or strawberries.   cup applesauce (no sugar added).  2 tbs raisins.   banana.   cup canned fruit, packed in water, its own juice, or sweetened with a sugar substitute.   cup unsweetened fruit juice. Dairy Each serving equals 12 to 15 grams of carbohydrates.  1 cup fat-free milk.  6 oz artificially sweetened yogurt or plain yogurt.  1 cup low-fat buttermilk.  1 cup soy milk.  1 cup almond milk. Meat/Protein  1 large egg.  2 to 3 oz meat, poultry, or fish.   cup low-fat cottage cheese.  1 tbs peanut butter.  1 oz low-fat cheese.   cup tuna in water.   cup tofu. Fat  1 tsp oil.  1 tsp trans-fat-free margarine.  1 tsp butter.  1 tsp mayonnaise.  2 tbs avocado.  1 tbs salad dressing.  1 tbs cream cheese.  2 tbs sour cream. SAMPLE 1800 CALORIE DIET PLAN Breakfast   cup unsweetened cereal (1 carb serving).  1 cup fat-free milk (1 carb serving).  1 slice whole-wheat toast (1 carb serving).   small banana (1 carb serving).  1 scrambled egg.  1 tsp trans-fat-free margarine. Lunch  Tuna sandwich.  2 slices whole-wheat bread (2 carb servings).   cup canned tuna in water, drained.  1 tbs reduced fat mayonnaise.  1 stalk celery, chopped.  2 slices tomato.  1 lettuce leaf.  1 cup carrot sticks.  24 to 30 seedless grapes (2 carb  servings).  6 oz light yogurt (1 carb serving). Afternoon Snack  3 graham cracker squares (1 carb serving).  Fat-free milk, 1 cup (1 carb serving).  1 tbs peanut butter. Dinner  3 oz salmon, broiled with 1 tsp oil.  1 cup mashed potatoes (2 carb servings) with 1 tsp trans-fat-free margarine.  1 cup fresh or frozen green beans.  1 cup steamed asparagus.  1 cup fat-free milk (1 carb serving). Evening Snack  3 cups air-popped popcorn (1 carb serving).  2 tbs parmesan cheese sprinkled on top. MEAL PLAN Use this worksheet to help you make a daily meal plan based on the 1800 calorie diet suggestions. If you are using this plan to help you control your blood glucose, you may interchange carbohydrate-containing foods (dairy, starches, and fruits). Select a variety of fresh foods of varying colors and flavors. The total amount of carbohydrate in your meals or snacks is more important than making sure you include all of the food groups every time you eat. Choose from the following foods to build your day's meals:  8 Starches.  4 Vegetables.  3 Fruits.  2 Dairy.  6 to 7 oz Meat/Protein.  Up to 4 Fats. Your dietician can use this worksheet to help you decide how many servings and which types of foods are right for you. BREAKFAST Food Group and Servings / Food Choice Starch ________________________________________________________ Dairy _________________________________________________________ Fruit _________________________________________________________ Meat/Protein __________________________________________________ Fat ___________________________________________________________ LUNCH Food Group and Servings / Food Choice Starch ________________________________________________________ Meat/Protein __________________________________________________ Vegetable _____________________________________________________ Fruit  _________________________________________________________ Dairy _________________________________________________________ Fat ___________________________________________________________ Henry [name] Food Group and Servings / Food Choice Starch ________________________________________________________ Meat/Protein __________________________________________________ Fruit __________________________________________________________ Dairy _________________________________________________________ Henry [name] Food Group and Servings / Food Choice Starch _________________________________________________________ Meat/Protein ___________________________________________________ Dairy __________________________________________________________ Vegetable ______________________________________________________ Fruit ___________________________________________________________ Fat ____________________________________________________________ Henry [name] Food Group and Servings / Food Choice Fruit __________________________________________________________ Meat/Protein ___________________________________________________ Dairy __________________________________________________________ Starch _________________________________________________________ DAILY TOTALS Starch ____________________________ Vegetable _________________________ Fruit _____________________________ Dairy _____________________________ Meat/Protein______________________ Fat _______________________________ Document Released: 07/18/2004 Document Revised: 03/20/2011 Document Reviewed: 11/11/2010 [employer] Patient Information 2013 [name], [place]. Sleep Apnea  Sleep apnea is a sleep disorder characterized by abnormal pauses in breathing while you sleep. When your breathing pauses, the level of oxygen in your blood decreases. This causes you to move out of deep sleep and into light sleep. As a result, your quality of sleep is poor, and the system  that carries your blood throughout your body (cardiovascular system) experiences stress. If sleep apnea remains untreated, the following conditions can develop:  High blood pressure (hypertension).  Coronary artery disease.  Inability to achieve or maintain an erection (impotence).  Impairment of your thought process (cognitive dysfunction). There are three types of sleep apnea: 1. Obstructive sleep apnea Pauses in breathing during sleep because of a blocked airway. 2. Central sleep apnea Pauses in breathing during sleep because the area of the brain that controls your breathing does not send the  correct signals to the muscles that control breathing. 3. Mixed sleep apnea A combination of both obstructive and central sleep apnea. RISK FACTORS The following risk factors can increase your risk of developing sleep apnea:  Being overweight.  Smoking.  Having narrow passages in your nose and throat.  Being of older age.  Being male.  Alcohol use.  Sedative and tranquilizer use.  Ethnicity. Among individuals younger than 35 years, African Americans are at increased risk of sleep apnea. SYMPTOMS   Difficulty staying asleep.  Daytime sleepiness and fatigue.  Loss of energy.  Irritability.  Loud, heavy snoring.  Morning headaches.  Trouble concentrating.  Forgetfulness.  Decreased interest in sex. DIAGNOSIS  In order to diagnose sleep apnea, your caregiver will perform a physical examination. Your caregiver may suggest that you take a home sleep test. Your caregiver may also recommend that you spend the night in a sleep lab. In the sleep lab, several monitors record information about your heart, lungs, and brain while you sleep. Your leg and arm movements and blood oxygen level are also recorded. TREATMENT The following actions may help to resolve mild sleep apnea:  Sleeping on your side.   Using a decongestant if you have nasal congestion.   Avoiding the use of  depressants, including alcohol, sedatives, and narcotics.   Losing weight and modifying your diet if you are overweight. There also are devices and treatments to help open your airway:  Oral appliances. These are custom-made mouthpieces that shift your lower jaw forward and slightly open your bite. This opens your airway.  Devices that create positive airway pressure. This positive pressure "splints" your airway open to help you breathe better during sleep. The following devices create positive airway pressure:  Continuous positive airway pressure (CPAP) device. The CPAP device creates a continuous level of air pressure with an air pump. The air is delivered to your airway through a mask while you sleep. This continuous pressure keeps your airway open.  Nasal expiratory positive airway pressure (EPAP) device. The EPAP device creates positive air pressure as you exhale. The device consists of single-use valves, which are inserted into each nostril and held in place by adhesive. The valves create very little resistance when you inhale but create much more resistance when you exhale. That increased resistance creates the positive airway pressure. This positive pressure while you exhale keeps your airway open, making it easier to breath when you inhale again.  Bilevel positive airway pressure (BPAP) device. The BPAP device is used mainly in patients with central sleep apnea. This device is similar to the CPAP device because it also uses an air pump to deliver continuous air pressure through a mask. However, with the BPAP machine, the pressure is set at two different levels. The pressure when you exhale is lower than the pressure when you inhale.  Surgery. Typically, surgery is only done if you cannot comply with less invasive treatments or if the less invasive treatments do not improve your condition. Surgery involves removing excess tissue in your airway to create a wider passage way. Document  Released: 12/16/2001 Document Revised: 06/27/2011 Document Reviewed: 05/04/2011 [facility] Patient Information 2013 [place], [place].

## 2012-04-17 NOTE — Progress Notes (Signed)
[facility]  Provider:  Dr Aydn Ferrara Referring Provider: Pecola Lawless, MD Primary Care Physician:  Marga Melnick, MD  Chief Complaint  Patient presents with  . Follow-up    CPAP    HPI:  Henry [name] is a 63 y.o. male here as a referral from Dr. Alwyn Ren for Henry [name] is a 62 year old right-handed Caucasian male with a history of idiopathic Parkinson's disease obstructive sleep apnea, hearing loss in the right ear, right acoustic neuroma, coronary artery disease, coronary artery bypass surgery in 1989, diabetes, obesity and lower back and hip pain he has documented acquired and congenital spinal stenosis documented on CT of the lumbar spine in 2008 showing neural foraminal narrowing at L5-S1 slightly congenital acquired stenosis L3-L4 left dominant, and a congenital small canal as well as bilateral facet degenerative disc disease. 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  His Parkinson's disease was diagnosed in January 1999 primarily tremor dominant in the left arm and hand and worsened by 2001 involving both arms and hands.  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 Obstructive sleep apnea was diagnosed in 2006 but due to claustrophobia he had problems with compliance with CPAP therapy. In June 2013 he had a right another sleep study and was re\re titrated to a CPAP at 12 cm water his VNA stimulator in July 2010 require battery replacement he has not had any falls hallucinations or Bolden good continence but has bladder control difficulties he is also daytime sleepy medicines were trihexyphenidyl 2 mg 3 times a day and  Mirapex or 0.5 mg 1 tablet 3 times a day he had marked improvement in tremor but the deep brain stimulator was adjusted in June 2013 he has been having nightmares and following the application at night.  his blood sugars are running high at 140s to 160s. In this sleep consult  with me ( consult August 2013) he was diagnosed with an apnea hypopnea index of 26.8 he used a full face marks and has been spotty at best with compliance.  his Epworth score indicates severe hypersomnia between 20.18 times and today  Is 12 . He does think that his night sleep was more refreshing.  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  Review of Systems: Out of a complete 14 system review, the patient complains of only the following symptoms, and all other reviewed systems are negative. Hypersomnia, dysphonia, mild dysarthria,constipation, poor sleep - from apnea .   History   Social History  . Marital Status: Married    Spouse Name: [name]    Number of Children: 2  . Years of Education: N/A   Occupational History  . 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 Smoking status: Never Smoker   . Smokeless tobacco: Never Used  . Alcohol Use:  No  . Drug Use: No  . Sexually Active: Not on file   Other Topics Concern  . Not on file   Social History Narrative  . No narrative on file    Family History  Problem Relation Age of Onset  . Peripheral vascular disease    . Arthritis    . Diabetes    . Alcoholism Mother 69    deceased   . Aneurysm Father 82  . Autoimmune disease Brother 1    AIDS    Past Medical History  Diagnosis Date  . Cardiomyopathy   . CAD (coronary artery disease)   . HTN (hypertension)   .  MI (myocardial infarction)   . Sleep apnea   . GERD (gastroesophageal reflux disease)   . Hyperthyroidism   . DM (diabetes mellitus)   . Hyperplasia, prostate   . Benign neoplasm of colon   . Nephrolithiasis   . Ventral hernia   . HLD (hyperlipidemia)   . Parkinson disease   . Parkinson disease     on DBS, FOLLOWED BY DR. Venetia Maxon, BATTERY CHANGED 2010-after 5 years  . OSA (obstructive sleep apnea)     AHI-28,on CPAP  . Sleep apnea, organic     Past Surgical History  Procedure Laterality Date  . Coronary artery bypass graft  2000    Tressie Stalker, MD  . Coronary stent placement  1998  . Median sternotomy  2000  . Deep brain stimulator placement  2004    Right and left VIN stimulator placement     Current Outpatient Prescriptions  Medication Sig Dispense Refill  . albuterol (VENTOLIN HFA) 108 (90 BASE) MCG/ACT inhaler Inhale 2 puffs into the lungs every 6 (six) hours as needed for wheezing.  1 Inhaler  1  . AMBULATORY NON FORMULARY MEDICATION Medication Name: CPAP nightly, Dr.Love      . aspirin 81 MG tablet Take 81 mg by mouth daily.        . clonazePAM (KLONOPIN) 0.5 MG tablet Take 0.25 mg by mouth at bedtime.       Marland Kitchen doxycycline (VIBRA-TABS) 100 MG tablet Take 1 tablet (100 mg total) by mouth 2 (two) times daily.  14 tablet  0  . Fluticasone-Salmeterol (ADVAIR DISKUS) 250-50 MCG/DOSE AEPB Inhale 1 puff into the lungs 2 (two) times daily.  1 each  2  . furosemide (LASIX) 80 MG tablet TAKE 1 TABLET IN THE MORNING AND 1/2 TABLET IN THE EVENING  45 tablet  5  . glimepiride (AMARYL) 2 MG tablet LABS DUE, 1 by mouth in the morning and 1/2 in the evening with meal  45 tablet  0  . JANUMET 50-1000 MG per tablet TAKE 1 TABLET TWICE A DAY WITH 2 LARGEST MEALS  60 tablet  3  . KLOR-CON M10 10 MEQ tablet TAKE 3 TABLETS BY MOUTH DAILY  90 tablet  5  . latanoprost (XALATAN) 0.005 % ophthalmic solution 1 drop. Use in right eye daily      . levothyroxine (SYNTHROID, LEVOTHROID) 125 MCG tablet  Take 1 tablet (125 mcg total) by mouth daily.  30 tablet  3  . levothyroxine (SYNTHROID, LEVOTHROID) 125 MCG tablet TAKE 1 TABLET (125 MCG TOTAL) BY MOUTH DAILY.  30 tablet  1  . losartan (COZAAR) 50 MG tablet TAKE 1 TABLET (50 MG TOTAL) BY MOUTH DAILY.  30 tablet  3  . metoprolol succinate (TOPROL-XL) 50 MG 24 hr tablet TAKE 1 TABLET BY MOUTH EVERY DAY  30 tablet  11  . Multiple Vitamins-Minerals (ANTIOXIDANT PO) Take by mouth daily.      . nitroGLYCERIN (NITROSTAT) 0.4 MG SL tablet Place 1 tablet (0.4 mg total) under the tongue every 5 (five) minutes as needed for chest pain.  25 tablet  12  . potassium chloride (KLOR-CON) 20 MEQ packet Take 20 mEq by mouth 2 (two) times daily. [place]      . pramipexole (MIRAPEX) 0.5 MG tablet Take 0.5 mg by mouth 3 (three) times daily.        . rosuvastatin (CRESTOR) 20 MG tablet 1 by mouth daily  90 tablet  3  . Sennosides (LAXATIVE PILLS PO) Take 25 mg by mouth daily. One tablet daily      . thiamine (VITAMIN B-1) 100 MG tablet Take 100 mg by mouth daily. At night      . traMADol (ULTRAM) 50 MG tablet TAKE 1 TABLET BY MOUTH EVERY 6 HOURS AS NEEDED FOR PAIN  60 tablet  0  . travoprost, benzalkonium, (TRAVATAN) 0.004 % ophthalmic solution Place 1 drop into the right eye at bedtime.        . trihexyphenidyl (ARTANE) 2 MG tablet Take 2 mg by mouth 3 (three) times daily with meals.         No current facility-administered medications for this visit.    Allergies as of 04/17/2012 - Review Complete 04/17/2012  Allergen Reaction Noted  . Penicillins      Vitals: BP 130/82  Pulse 75  Temp(Src) 98 F (36.7 C)  Ht 5\' 10"  (1.778 m)  Wt 273 lb (123.832 kg)  BMI 39.17 kg/m2 Last Weight:  Wt Readings from Last 1 Encounters:  04/17/12 273 lb (123.832 kg)   Last Height:   Ht Readings from Last 1 Encounters:  04/17/12 5\' 10"  (1.778 m)    Physical exam:  General: The patient is awake, alert and appears not in acute distress. The patient is well  groomed. Head: Normocephalic, atraumatic. Neck is supple. Mallampati 3, neck circumference:17 inches . Cardiovascular:  Regular rate and rhythm, without  murmurs or carotid bruit, and without distended neck veins. Respiratory: Lungs are clear to auscultation. Skin:  Without evidence of edema, or rash Trunk: BMI is elevated and patient  Has a slightly slouched posture.  Neurologic exam : The patient is awake and alert, oriented to place and time.  Memory subjective  described as impaired - short term mostly- t. There is a normal attention span & concentration ability. Speech is fluent with dysarthria, dysphonia , not aphasia. Mood and affect are depressed .  Cranial nerves: Pupils are equal and briskly reactive to light. Funduscopic exam deferred  to Dr. Shirlyn Goltz- seen last monday  .  Extraocular movements  in vertical and horizontal planes intact and without nystagmus. Visual fields by finger perimetry are intact. Hearing to finger rub intact.  Facial sensation intact to fine touch. Facial motor strength is symmetric and tongue and uvula move midline.  Motor exam: diffusely increased tone and normal muscle bulk-  and left dominant cogwheeling. Positive right hip maneuver.  Sensory:  Fine touch, pinprick and vibration were tested in all extremities and his feet and mid-calf are numb- . Proprioception is tested in the upper extremities only. This was normal.  Coordination: Rapid alternating movements in the fingers/hands is tested and normal.  Finger-to-nose maneuver tested and notable for slowed alternating movements but no  evidence of ataxia, dysmetria or tremor. (He lost his right index finger )  Gait and station: Patient  walks without assistive device and is able unassisted to  climb up to the exam table.  Rises from seated position with his arms crossed in front of the chest - Strength within normal limits. Stance is stable and normal. Tandem gait is  Impaired ,but he  turns with 4 Steps -  unfragmented. Romberg testing shows retropulsion.  Fall risk is 12 points , he stumbled over a curb a few weeks ago. Marland Kitchen   Deep tendon reflexes: in the  upper and lower extremities are symmetric and intact. Babinski maneuver response is downgoing.   Assessment:  After physical and neurologic examination, review of laboratory studies, imaging, neurophysiology testing and pre-existing records, assessment will be reviewed on the problem list.  I feel that the patient needs no increased DOS is off his Parkinson specific medications. It I have discussed he cannot pull from impulsive the apnea further up, compliance and I would recommend to use a low-dose of Klonopin for pulse sleep induction and decreasing anxiety and claustrophobia. This also will health REM sleep behavior disorder. In addition I gave the patient samples of Senokot him since he has constipation. I would like to see him every 6 months alternating between my nurse practitioner and me . His DBS stimulator is adjusted through Dr. Fredrich Birks office  Plan:  Treatment plan and additional workup will be reviewed under Problem List.

## 2012-04-22 ENCOUNTER — Other Ambulatory Visit: Payer: Self-pay | Admitting: Internal Medicine

## 2012-04-22 ENCOUNTER — Other Ambulatory Visit: Payer: Self-pay | Admitting: *Deleted

## 2012-04-22 ENCOUNTER — Other Ambulatory Visit: Payer: Self-pay

## 2012-04-22 DIAGNOSIS — E119 Type 2 diabetes mellitus without complications: Secondary | ICD-10-CM

## 2012-04-22 MED ORDER — SITAGLIPTIN PHOS-METFORMIN HCL 50-1000 MG PO TABS: ORAL_TABLET | ORAL | Status: DC

## 2012-04-22 MED ORDER — PRAMIPEXOLE DIHYDROCHLORIDE 0.5 MG PO TABS: 0.5000 mg | ORAL_TABLET | Freq: Three times a day (TID) | ORAL | Status: DC

## 2012-04-22 MED ORDER — TRIHEXYPHENIDYL HCL 2 MG PO TABS: 2.0000 mg | ORAL_TABLET | Freq: Three times a day (TID) | ORAL | Status: DC

## 2012-04-29 ENCOUNTER — Other Ambulatory Visit: Payer: Self-pay | Admitting: Cardiology

## 2012-04-30 ENCOUNTER — Other Ambulatory Visit: Payer: Self-pay

## 2012-04-30 MED ORDER — LEVOTHYROXINE SODIUM 125 MCG PO TABS: 125.0000 ug | ORAL_TABLET | Freq: Every day | ORAL | Status: DC

## 2012-05-09 DIAGNOSIS — M545 Low back pain, unspecified: Secondary | ICD-10-CM | POA: Diagnosis not present

## 2012-05-13 ENCOUNTER — Other Ambulatory Visit: Payer: Self-pay | Admitting: Internal Medicine

## 2012-05-13 DIAGNOSIS — D485 Neoplasm of uncertain behavior of skin: Secondary | ICD-10-CM | POA: Diagnosis not present

## 2012-05-13 DIAGNOSIS — L723 Sebaceous cyst: Secondary | ICD-10-CM | POA: Diagnosis not present

## 2012-05-13 NOTE — Telephone Encounter (Signed)
A1c & urine microalbumin 250.02

## 2012-05-20 ENCOUNTER — Other Ambulatory Visit: Payer: Self-pay | Admitting: Internal Medicine

## 2012-06-03 ENCOUNTER — Other Ambulatory Visit: Payer: Self-pay | Admitting: Internal Medicine

## 2012-06-03 DIAGNOSIS — IMO0001 Reserved for inherently not codable concepts without codable children: Secondary | ICD-10-CM

## 2012-06-04 NOTE — Telephone Encounter (Signed)
Left message with male to have patient return call when available

## 2012-06-05 NOTE — Telephone Encounter (Signed)
Patient called me back, patient will walk-in at [facility] and get labs done tomorrow. Medication will be filled based on lab values

## 2012-06-06 ENCOUNTER — Other Ambulatory Visit (INDEPENDENT_AMBULATORY_CARE_PROVIDER_SITE_OTHER): Payer: Federal, State, Local not specified - PPO

## 2012-06-06 DIAGNOSIS — IMO0001 Reserved for inherently not codable concepts without codable children: Secondary | ICD-10-CM

## 2012-06-06 LAB — HEMOGLOBIN A1C: Hgb A1c MFr Bld: 7.5 % — ABNORMAL HIGH (ref 4.6–6.5)

## 2012-06-06 LAB — MICROALBUMIN / CREATININE URINE RATIO: Microalb, Ur: 0.2 mg/dL (ref 0.0–1.9)

## 2012-06-14 ENCOUNTER — Telehealth: Payer: Self-pay | Admitting: Internal Medicine

## 2012-06-14 ENCOUNTER — Ambulatory Visit (INDEPENDENT_AMBULATORY_CARE_PROVIDER_SITE_OTHER): Payer: Federal, State, Local not specified - PPO | Admitting: Internal Medicine

## 2012-06-14 VITALS — BP 128/74 | HR 78 | Temp 98.0°F | Wt 266.0 lb

## 2012-06-14 DIAGNOSIS — W57XXXA Bitten or stung by nonvenomous insect and other nonvenomous arthropods, initial encounter: Secondary | ICD-10-CM

## 2012-06-14 DIAGNOSIS — T148 Other injury of unspecified body region: Secondary | ICD-10-CM

## 2012-06-14 MED ORDER — DOXYCYCLINE HYCLATE 100 MG PO TABS: 100.0000 mg | ORAL_TABLET | Freq: Two times a day (BID) | ORAL | Status: DC

## 2012-06-14 NOTE — Telephone Encounter (Signed)
Patient Information:  Caller Name: Thaddeaus  Phone: [phone]  Patient: Henry [name], Henry [name]  Gender: Male  DOB: [birth year]  Age: 62 Years  PCP: Marga Melnick  Office Follow Up:  Does the office need to follow up with this patient?: No  Instructions For The Office: N/A   Symptoms  Reason For Call & Symptoms: Removed two ticks 06/13/12; deer tick from right shoulder blade and dog tick from right upper arm.  Reports quarter and fifty cent sized, itchy "welts" at sites of removal with redness.  Reviewed Health History In EMR: Yes  Reviewed Medications In EMR: Yes  Reviewed Allergies In EMR: Yes  Reviewed Surgeries / Procedures: Yes  Date of Onset of Symptoms: 06/14/2012  Treatments Tried: Swabbed with alcohol and betadine  Treatments Tried Worked: Yes  Guideline(s) Used:  Tick Bite  Disposition Per Guideline:   See Today in Office  Reason For Disposition Reached:   Red ring or bull's-eye rash occurs around a deer tick bite  Advice Given:  Antibiotic Ointment:  Wash the wound and your hands with soap and water after removal to prevent catching any tick disease. Apply an over-the-counter antibiotic ointment (e.g., bacitracin) to the bite once.  Expected Course:  Tick bites normally do not itch or hurt. That is why they often go unnoticed.  Call Back If:  Fever or rash occur in the next 2 weeks  You become worse.  Patient Will Follow Care Advice:  YES  Appointment Scheduled:  06/14/2012 15:45:00 Appointment Scheduled Provider:  Willow Ora

## 2012-06-14 NOTE — Telephone Encounter (Signed)
Noted, patient with pending appointment. Per MD protocol if appointment scheduled ok to close encounter

## 2012-06-14 NOTE — Progress Notes (Signed)
  Subjective:    Patient ID: Henry [name], male    DOB: 1950-03-25, 62 y.o.   MRN: [id number]  HPI Acute visit. 2 days ago found two ticks, he pulled them, now has a rash around the area of the bite. Concern about Lyme.  Past Medical History  Diagnosis Date  . Cardiomyopathy   . CAD (coronary artery disease)   . HTN (hypertension)   . MI (myocardial infarction)   . Sleep apnea   . GERD (gastroesophageal reflux disease)   . Hyperthyroidism   . DM (diabetes mellitus)   . Hyperplasia, prostate   . Benign neoplasm of colon   . Nephrolithiasis   . Ventral hernia   . HLD (hyperlipidemia)   . Parkinson disease   . Parkinson disease     on DBS, FOLLOWED BY DR. Venetia Maxon, BATTERY CHANGED 2010-after 5 years  . OSA (obstructive sleep apnea)     AHI-28,on CPAP  . Sleep apnea, organic    Past Surgical History  Procedure Laterality Date  . Coronary artery bypass graft  2000    Tressie Stalker, MD  . Coronary stent placement  1998  . Median sternotomy  2000  . Deep brain stimulator placement  2004    Right and left VIN stimulator placement      Review of Systems Feels well. Denies any fever chills, no headaches or aches.     Objective:   Physical Exam  Constitutional: He appears well-developed and well-nourished. No distress.  Musculoskeletal: He exhibits no edema.  Skin: He is not diaphoretic.     Psychiatric: He has a normal mood and affect. His behavior is normal. Judgment and thought content normal.      Assessment & Plan:   Tick bite, Presents 2 days after the tick bite, now he has a rash which is not classic for Lyme disease, However patient is concerned . Discussed the patient observation versus treatment, pros and cons, he asked me to decide what to do. Recommend doxycycline. See instructions

## 2012-06-14 NOTE — Patient Instructions (Addendum)
Call if fever , headaches or joint aches. Call if nausea or diarrhea

## 2012-06-15 ENCOUNTER — Encounter: Payer: Self-pay | Admitting: Internal Medicine

## 2012-06-19 DIAGNOSIS — H4030X0 Glaucoma secondary to eye trauma, unspecified eye, stage unspecified: Secondary | ICD-10-CM | POA: Diagnosis not present

## 2012-06-19 DIAGNOSIS — H40019 Open angle with borderline findings, low risk, unspecified eye: Secondary | ICD-10-CM | POA: Diagnosis not present

## 2012-06-19 DIAGNOSIS — S0590XA Unspecified injury of unspecified eye and orbit, initial encounter: Secondary | ICD-10-CM | POA: Diagnosis not present

## 2012-06-19 DIAGNOSIS — H409 Unspecified glaucoma: Secondary | ICD-10-CM | POA: Diagnosis not present

## 2012-06-21 ENCOUNTER — Encounter: Payer: Self-pay | Admitting: Lab

## 2012-06-24 ENCOUNTER — Encounter: Payer: Self-pay | Admitting: Internal Medicine

## 2012-06-24 ENCOUNTER — Ambulatory Visit (INDEPENDENT_AMBULATORY_CARE_PROVIDER_SITE_OTHER): Payer: Federal, State, Local not specified - PPO | Admitting: Internal Medicine

## 2012-06-24 VITALS — BP 116/68 | HR 78 | Wt 263.0 lb

## 2012-06-24 DIAGNOSIS — IMO0001 Reserved for inherently not codable concepts without codable children: Secondary | ICD-10-CM

## 2012-06-24 NOTE — Patient Instructions (Addendum)
Please check your plan to define all diabetic medication options and cost. Medication choice will be based on efficacy and safety and lastly based on cost. Ultimately discontinuation of the oral sulfonylurea would be preferred because of potential cardiac risk and hypoglycemia.   Recheck A1c in 4 months (250.02)

## 2012-06-24 NOTE — Progress Notes (Signed)
Subjective:    Patient ID: Henry [name], male    DOB: Nov 13, 1950, 62 y.o.   MRN: [id number]  HPI Diabetes status assessment: Fasting or morning glucose range 138-208 Glucose 2 hours after any meal is rarely checked. Hypoglycemia reported only if fishing for 6 hrs & not eating                                                                                                               Regular exercise described as  2-5  times per week for 45- 60 minutes in pool. No specific nutrition/diet followed Medication compliance is good. No medication adverse effects noted. Eye exam current (last week); no retinopathy . Foot care  Current ( Dr Dellia Nims)  A1c/ urine microalbumin monitor 7.5% (average sugar154 with long-term 50 %  risk )        Review of Systems No excess thirst or excess hunger . Excess urination reported                              No lightheadedness with standing reported No chest pain ; palpitations ; claudication described .                                                                                                                             No non healing skin  ulcers or sores of extremities noted. No numbness or tingling or burning in feet described                                                                                                                                             Significant change in weight of  10 pound loss with CVE No blurred,double, or loss of vision reported  .        Objective:   Physical Exam  Gen.:  well-nourished in appearance. Alert, appropriate and cooperative throughout exam. Eyes: No corneal or conjunctival inflammation noted.  Neck: No deformities, masses, or tenderness noted.  Thyroid normal. Lungs: Normal respiratory effort; chest expands symmetrically. Lungs are clear to auscultation without rales, wheezes, or increased work of breathing. Heart: Normal rate and rhythm. Normal S1 and S2. No gallop, click, or rub. S4 w/o  murmur. Abdomen: Bowel sounds normal; abdomen soft and nontender. No masses, organomegaly or hernias noted.Protuberant           Musculoskeletal/extremities: No clubbing, cyanosis, edema, or significant extremity  deformity noted. Tone & strength  Normal. Joints  reveal amputation R index DIP . Nail health good except R great toenail. Able to lie down & sit up w/o help.  Vascular: Carotid, radial artery, dorsalis pedis and  posterior tibial pulses are full and equal. No bruits present. Neurologic: Alert and oriented x3. Deep tendon reflexes symmetrical and normal.  Light touch normal over feet.        Skin: Intact without suspicious lesions or rashes. Lymph: No cervical, axillary lymphadenopathy present. Psych: Mood and affect are normal. Normally interactive                                                                                        Assessment & Plan:  See Current Assessment & Plan in Problem List under specific Diagnosis

## 2012-06-24 NOTE — Assessment & Plan Note (Signed)
Minimal A1c goal is 8%;the present A1c is very good. He should hold his Glimiperide if he is going to be fasting for a period of time.  I had planned to discontinue Glimiperide; but continued weight loss will allow that to occur. Clarification is needed about the safety of combination therapies (Ex insulin & metformin) for diabetes before additional changes made.  A1c  in 4 months

## 2012-06-27 ENCOUNTER — Other Ambulatory Visit: Payer: Self-pay | Admitting: Internal Medicine

## 2012-06-28 NOTE — Telephone Encounter (Signed)
Refill done.  

## 2012-07-11 ENCOUNTER — Telehealth: Payer: Self-pay | Admitting: *Deleted

## 2012-07-11 DIAGNOSIS — M62838 Other muscle spasm: Secondary | ICD-10-CM | POA: Diagnosis not present

## 2012-07-11 MED ORDER — POTASSIUM CHLORIDE CRYS ER 10 MEQ PO TBCR: EXTENDED_RELEASE_TABLET | ORAL | Status: DC

## 2012-07-11 NOTE — Telephone Encounter (Signed)
Refill done.  

## 2012-07-17 DIAGNOSIS — M62838 Other muscle spasm: Secondary | ICD-10-CM | POA: Diagnosis not present

## 2012-07-22 DIAGNOSIS — M62838 Other muscle spasm: Secondary | ICD-10-CM | POA: Diagnosis not present

## 2012-07-24 DIAGNOSIS — M62838 Other muscle spasm: Secondary | ICD-10-CM | POA: Diagnosis not present

## 2012-07-29 DIAGNOSIS — M47817 Spondylosis without myelopathy or radiculopathy, lumbosacral region: Secondary | ICD-10-CM | POA: Diagnosis not present

## 2012-07-29 DIAGNOSIS — M62838 Other muscle spasm: Secondary | ICD-10-CM | POA: Diagnosis not present

## 2012-07-31 DIAGNOSIS — M62838 Other muscle spasm: Secondary | ICD-10-CM | POA: Diagnosis not present

## 2012-07-31 DIAGNOSIS — G20 Parkinson's disease: Secondary | ICD-10-CM | POA: Diagnosis not present

## 2012-07-31 DIAGNOSIS — E669 Obesity, unspecified: Secondary | ICD-10-CM | POA: Diagnosis not present

## 2012-07-31 DIAGNOSIS — M47817 Spondylosis without myelopathy or radiculopathy, lumbosacral region: Secondary | ICD-10-CM | POA: Diagnosis not present

## 2012-08-05 ENCOUNTER — Other Ambulatory Visit: Payer: Self-pay | Admitting: Internal Medicine

## 2012-08-05 ENCOUNTER — Telehealth: Payer: Self-pay | Admitting: Neurology

## 2012-08-05 DIAGNOSIS — M62838 Other muscle spasm: Secondary | ICD-10-CM | POA: Diagnosis not present

## 2012-08-05 NOTE — Telephone Encounter (Signed)
Pt needs new pt appoint with me. Has a DBS. Please give him an appt. Prefer last of AM or last of day.  LM for pt to call for appt. Currently awaiting a return call / Sherri S.

## 2012-08-07 DIAGNOSIS — M62838 Other muscle spasm: Secondary | ICD-10-CM | POA: Diagnosis not present

## 2012-08-12 DIAGNOSIS — M47817 Spondylosis without myelopathy or radiculopathy, lumbosacral region: Secondary | ICD-10-CM | POA: Diagnosis not present

## 2012-08-12 DIAGNOSIS — M62838 Other muscle spasm: Secondary | ICD-10-CM | POA: Diagnosis not present

## 2012-08-15 DIAGNOSIS — M62838 Other muscle spasm: Secondary | ICD-10-CM | POA: Diagnosis not present

## 2012-08-15 DIAGNOSIS — M47817 Spondylosis without myelopathy or radiculopathy, lumbosacral region: Secondary | ICD-10-CM | POA: Diagnosis not present

## 2012-08-19 ENCOUNTER — Ambulatory Visit (INDEPENDENT_AMBULATORY_CARE_PROVIDER_SITE_OTHER): Payer: Federal, State, Local not specified - PPO | Admitting: Neurology

## 2012-08-19 ENCOUNTER — Encounter: Payer: Self-pay | Admitting: Neurology

## 2012-08-19 ENCOUNTER — Telehealth: Payer: Self-pay | Admitting: Neurology

## 2012-08-19 VITALS — BP 110/60 | HR 68 | Temp 98.0°F | Resp 16 | Wt 267.0 lb

## 2012-08-19 DIAGNOSIS — Z79899 Other long term (current) drug therapy: Secondary | ICD-10-CM

## 2012-08-19 DIAGNOSIS — K117 Disturbances of salivary secretion: Secondary | ICD-10-CM

## 2012-08-19 DIAGNOSIS — G20 Parkinson's disease: Secondary | ICD-10-CM

## 2012-08-19 DIAGNOSIS — G20A1 Parkinson's disease without dyskinesia, without mention of fluctuations: Secondary | ICD-10-CM | POA: Insufficient documentation

## 2012-08-19 DIAGNOSIS — R1310 Dysphagia, unspecified: Secondary | ICD-10-CM

## 2012-08-19 LAB — CBC WITH DIFFERENTIAL/PLATELET
Basophils Relative: 0.3 % (ref 0.0–3.0)
Eosinophils Absolute: 0.2 10*3/uL (ref 0.0–0.7)
MCHC: 32.7 g/dL (ref 30.0–36.0)
MCV: 85.3 fl (ref 78.0–100.0)
Monocytes Absolute: 0.5 10*3/uL (ref 0.1–1.0)
Neutrophils Relative %: 62.9 % (ref 43.0–77.0)
Platelets: 146 10*3/uL — ABNORMAL LOW (ref 150.0–400.0)

## 2012-08-19 LAB — COMPREHENSIVE METABOLIC PANEL
AST: 11 U/L (ref 0–37)
Albumin: 3.9 g/dL (ref 3.5–5.2)
Alkaline Phosphatase: 66 U/L (ref 39–117)
BUN: 14 mg/dL (ref 6–23)
Potassium: 3.4 mEq/L — ABNORMAL LOW (ref 3.5–5.1)
Sodium: 139 mEq/L (ref 135–145)
Total Bilirubin: 0.5 mg/dL (ref 0.3–1.2)

## 2012-08-19 LAB — FOLATE: Folate: >24.8 ng/mL (ref >5.9)

## 2012-08-19 NOTE — Telephone Encounter (Signed)
Tiff, will you let pt know that I talked with Dr. Venetia Maxon and he was agreeable for battery change.  I suspect that they will need to call office and see if he wants to see them prior or if it just needs to be scheduled.

## 2012-08-19 NOTE — Progress Notes (Signed)
Henry [name] was seen today in the movement disorders clinic for neurologic consultation at the request of Kerri Perches.  [name], MD.  The consultation is for the evaluation of PD and to manage his DBS.  He is accompanied by his wife who supplements the history.  The first symptom(s) the patient noticed was right hand tremor in 1999.  He was seen by neurology and was dx with PD.  He was placed on something, but it caused sleepiness.  He does not think that he has ever been on levodopa.   He was placed on Mirapex, which seemed to help.  He only takes it twice per day.  He didn't think it made a difference when he took it tid.  He began to have tremor and it was suggested he do DBS.    The pt is s/p stn DBS in 2005.  He had a battery change in 2009.    Specific Symptoms:  Tremor: yes (still some, in both hands and L leg) Voice: hypophonic and difficulty singing Sleep: dozes during day.  Has OSAS but doesn't wear  Vivid Dreams:  yes  Acting out dreams:  yes Wet Pillows: yes Postural symptoms:  yes  Falls?  yes (last fall 3-4 weeks ago; no hx of fx with fall) Bradykinesia symptoms: slurred or difficult speech, drooling while awake, reduced facial expressions and difficulty getting out of a chair Loss of smell:  yes Loss of taste:  no Urinary Incontinence:  yes Difficulty Swallowing:  yes Handwriting, micrographia: yes Trouble with ADL's:  no  Trouble buttoning clothing: no Depression:  yes Memory changes:  yes  (forgets whats talking about in middle of sentence.  Feels unfocused) Hallucinations:  no  visual distortions: yes N/V:  no Lightheaded:  no  Syncope: no Diplopia:  no Dyskinesia:  no  Neuroimaging has  previously been performed.  It is available for my review today.  This was done in February, 2005 in preparation for DBS.  The examination was essentially unremarkable.  The formal report is below:  Clinical Data: Parkinson's disease.  MRI BRAIN WITHOUT AND  WITH CONTRAST  Technique: Stealth protocol.  Multiplanar T1- weighted images were obtained before and after the administration of 20 cc of Omniscan. 5.0, 3.0, and ultimately 1.3 mm thick images were obtained through the brain with special attention to the basal ganglia and deep commissures to allow for stereotactic planning and treatment of Parkinson's disease. There are no discrete or focal lesions demonstrated on these limited pulse sequences, which do not include a diffusion or T2- weighted pulse sequence. FLAIR images were obtained and show no significant small vessel disease or ventriculomegaly.  IMPRESSION  MRI of the brain without and with contrast performed according to Stealth protocol showing no obvious focal or enhancing lesion.   PREVIOUS MEDICATIONS: Mirapex and artane  ALLERGIES:   Allergies  Allergen Reactions  . Penicillins     rash    CURRENT MEDICATIONS:     Medication List       This list is accurate as of: 08/19/12  4:25 PM.  Always use your most recent med list.               albuterol 108 (90 BASE) MCG/ACT inhaler  Commonly known as:  VENTOLIN HFA  Inhale 2 puffs into the lungs every 6 (six) hours as needed for wheezing.     ANTIOXIDANT FORMULA PO  Take by mouth daily.     aspirin 81  MG tablet  Take 81 mg by mouth daily.     clonazePAM 0.5 MG tablet  Commonly known as:  KLONOPIN  Take 0.25 mg by mouth at bedtime.     Fluticasone-Salmeterol 250-50 MCG/DOSE Aepb  Commonly known as:  ADVAIR  Inhale 1 puff into the lungs 2 (two) times daily as needed.     furosemide 80 MG tablet  Commonly known as:  LASIX  TAKE 1 TABLET IN THE MORNING AND 1/2 TABLET IN THE EVENING     glimepiride 2 MG tablet  Commonly known as:  AMARYL  1 by mouth in the am and 1/2 by mouth in the pm     ibuprofen 800 MG tablet  Commonly known as:  ADVIL,MOTRIN     latanoprost 0.005 % ophthalmic solution  Commonly known as:  XALATAN  1 drop. Use in right eye daily      levothyroxine 125 MCG tablet  Commonly known as:  SYNTHROID, LEVOTHROID  TAKE 1 TABLET (125 MCG TOTAL) BY MOUTH DAILY.     losartan 50 MG tablet  Commonly known as:  COZAAR  TAKE 1 TABLET (50 MG TOTAL) BY MOUTH DAILY.     metoprolol succinate 50 MG 24 hr tablet  Commonly known as:  TOPROL-XL  TAKE 1 TABLET BY MOUTH EVERY DAY     nitroGLYCERIN 0.4 MG SL tablet  Commonly known as:  NITROSTAT  Place 1 tablet (0.4 mg total) under the tongue every 5 (five) minutes as needed for chest pain.     potassium chloride 10 MEQ tablet  Commonly known as:  KLOR-CON M10  TAKE 3 TABLETS BY MOUTH DAILY     pramipexole 0.5 MG tablet  Commonly known as:  MIRAPEX  Take 0.5 mg by mouth 2 (two) times daily.     rosuvastatin 20 MG tablet  Commonly known as:  CRESTOR  1 by mouth daily     sitaGLIPtan-metformin 50-1000 MG per tablet  Commonly known as:  JANUMET  TAKE 1 TABLET TWICE A DAY WITH 2 LARGEST MEALS     traMADol 50 MG tablet  Commonly known as:  ULTRAM  TAKE 1 TABLET BY MOUTH EVERY 6 HOURS AS NEEDED FOR PAIN     travoprost (benzalkonium) 0.004 % ophthalmic solution  Commonly known as:  TRAVATAN  Place 1 drop into the right eye at bedtime.     trihexyphenidyl 2 MG tablet  Commonly known as:  ARTANE  Take 1 tablet (2 mg total) by mouth 3 (three) times daily.         PAST MEDICAL HISTORY:   Past Medical History  Diagnosis Date  . Cardiomyopathy   . CAD (coronary artery disease)   . HTN (hypertension)     pt denies 08/19/12  . MI (myocardial infarction)   . Sleep apnea   . GERD (gastroesophageal reflux disease)   . Hyperthyroidism   . DM (diabetes mellitus)   . Hyperplasia, prostate   . Benign neoplasm of colon   . Nephrolithiasis   . Ventral hernia   . HLD (hyperlipidemia)   . Parkinson disease     1999  . OSA (obstructive sleep apnea)     AHI-28,on CPAP, noncompliant with CPAP  . Sleep apnea, organic     PAST SURGICAL HISTORY:   Past Surgical History  Procedure  Laterality Date  . Coronary artery bypass graft  2000    Tressie Stalker, MD  . Coronary stent placement  1998  . Median sternotomy  2000  .  Deep brain stimulator placement  2004    Right and left VIN stimulator placement   . Acoustic neuroma resection  1981  . Finger amputation      left pointer    SOCIAL HISTORY:   History   Social History  . Marital Status: Married    Spouse Name: [name]    Number of Children: 2  . Years of Education: N/A   Occupational History  . 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 Smoking status: Never Smoker   . Smokeless tobacco: Never Used  . Alcohol Use: No  . Drug Use: No  . Sexually Active: Not on file   Other Topics Concern  . Not on file   Social History Narrative  . No narrative on file    FAMILY HISTORY:   Family Status  Relation Status Death Age  . Mother Deceased     complications of surgery  . Father Deceased     EtOHism  . Sister Alive     2 full, one half; PVD  . Brother Deceased     AIDS  . Child Alive     3, alive and well    ROS:  A complete 10 system review of systems was obtained and was unremarkable apart from what is mentioned above.  PHYSICAL EXAMINATION:    VITALS:   Filed Vitals:   08/19/12 1439  BP: 110/60  Pulse: 68  Temp: 98 F (36.7 C)  Resp: 16  Weight: 267 lb (121.11 kg)    GEN:  The patient appears stated age and is in NAD. HEENT:  Normocephalic, atraumatic.  The mucous membranes are moist. The superficial temporal arteries are without ropiness or tenderness. CV:  RRR Lungs:  CTAB Neck/HEME:  There are no carotid bruits bilaterally.  Neurological examination:  Orientation: The patient is alert and oriented x3. Fund of knowledge is appropriate.  Recent and remote memory are intact.  Attention and concentration are normal.    Able to name objects and repeat phrases. Cranial nerves: There is good facial symmetry. Pupils are equal round and reactive to light  bilaterally. Fundoscopic exam reveals clear margins bilaterally. Extraocular muscles are intact. The visual fields are full to confrontational testing. The speech is fluent and just mildly dysarthric.  He is hypophonic. Soft palate rises symmetrically and there is no tongue deviation. Hearing is intact to conversational tone. Sensation: Sensation is intact to light and pinprick throughout (facial, trunk, extremities). Vibration is intact at the bilateral big toe. There is no extinction with double simultaneous stimulation. There is no sensory dermatomal level identified. Motor: Strength is 5/5 in the bilateral upper and lower extremities.   Shoulder shrug is equal and symmetric.  There is no pronator drift. Deep tendon reflexes: Deep tendon reflexes are 0-1/4 at the bilateral biceps, triceps, brachioradialis, patella and achilles. Plantar responses are downgoing bilaterally.  Movement examination: Tone: There is mild increased tone in the left upper extremity that becomes moderate with activation procedures.  Tone in the right upper extremity is normal.   Abnormal movements: There is a near constant mild resting tremor of the left upper and left lower extremity. Coordination:  There is mild decremation with RAM's, seen most prominently with heel/toe taps bilaterally. Gait and Station: The patient has minimal difficulty arising out of a deep-seated chair without the use of the hands. The patient's stride length is just slightly decreased.  The patient has a neg pull test.  LABS  Lab Results  Component Value Date   WBC 5.9 04/10/2011   HGB 13.3 04/10/2011   HCT 40.2 04/10/2011   MCV 83.4 04/10/2011   PLT 144.0* 04/10/2011   Lab Results  Component Value Date   TSH 0.36 02/20/2012     Chemistry      Component Value Date/Time   NA 143 05/13/2009 0000   K 4.0 11/07/2011 0821   CL 104 05/13/2009 0000   CO2 33* 05/13/2009 0000   BUN 16 08/19/2010 0806   CREATININE 0.9 08/19/2010 0806      Component Value  Date/Time   CALCIUM 9.3 05/13/2009 0000   ALKPHOS 58 02/28/2012 0903   AST 24 02/28/2012 0903   ALT 36 02/28/2012 0903   BILITOT 0.5 02/28/2012 0903     No results found for this basename: VITAMINB12   Lab Results  Component Value Date   HGBA1C 7.5* 06/06/2012   DBS programming was performed today, which is described in more detail on a separate programming procedural notes.  In short, there was no significant change following programming today.  ASSESSMENT/PLAN:  1.  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    -I am not sure that I completely understand his medication regimen and need to get a copy of Dr. Alena Bills notes before making changes.  I do think that he is having side effects with the Artane, primarily cognition and dry mouth, and would like to see him eventually off of this medication, especially since it can cause balance changes.  I am hopeful that we will able to make changes to his DBS settings and get the tremor under better control.  I also need to figure out from Dr. Sandria Manly if he has been on levodopa in the past (pt reports intermittent freezing and shuffling, although did not see on todays exam)  -As above, I think that we could increase his DBS settings primarily on the right, to get better tremor control on the left.  I did not want to do that today, as his battery is depleting and did not want to expedite this.  I am not sure what his last voltage was on his battery check, but today is 2.44, suggesting that it is about time for a battery change.  I will talk with Dr. Venetia Maxon in this regard.  -Once the battery is changed, I would like to see the patient after a prolonged appointment where I can look at all of the contacts, and potentially even try a monopolar setting and see what type of clinical response that we get.  -He does not wish to go to the neuro rehabilitation center currently for the Parkinson's program 2.  RBD  -I encouraged the patient to use his  clonazepam, 0.5 mg, half to one tablet at night.  He has very significant RBD, and even rolled out of bed once and hit his head, which required stitches.  He has very vivid dreams that he acts out on at least 3 times per week. 3.  OSAS, noncompliant with CPAP.  -He follows with Dr. Vickey Huger.  He understands morbidity and mortality associated with untreated sleep apnea.  Weight loss would be of value 4.  Sialorrhea  -This is associated with Parkinson's disease.  He may benefit from Myobloc in the future. 5.  Dysphagia.  -Overall, this is fairly mild but I will give an eye on this and consider a MBE if needed. 6.  I will see him back  in the next 6 weeks, sooner should new neurologic issues arise.

## 2012-08-19 NOTE — Patient Instructions (Addendum)
1.  I will try to get a copy of Dr. Imagene Gurney notes.  I will contact Dr. Venetia Maxon and see if he thinks it is time to change the battery 2.  I want you to take klonopin - 0.5 mg - 1/2 tab to 1 tablet every night

## 2012-08-19 NOTE — Procedures (Signed)
DBS Programming was performed.    Total time spent programming was 40 minutes.  Device was confirmed to be on.  Soft start was confirmed to be on.  Impedences were checked and were within normal limits.  Battery was checked and was determined to be near end of  life.  Final settings were as follows:  Left brain electrode:     1-2+           ; Amplitude  3.7   V   ; Pulse width 90 microseconds;   Frequency  160    Hz.  Right brain electrode:     4-7+          ; Amplitude   4.0  V ;  Pulse width 90  microseconds;  Frequency   160    Hz.

## 2012-08-20 NOTE — Telephone Encounter (Signed)
Pt's wife aware and will call Dr. Fredrich Birks office to find out what they need to do.

## 2012-08-25 ENCOUNTER — Other Ambulatory Visit: Payer: Self-pay | Admitting: Endocrinology

## 2012-08-26 ENCOUNTER — Telehealth: Payer: Self-pay

## 2012-08-26 ENCOUNTER — Telehealth: Payer: Self-pay | Admitting: Internal Medicine

## 2012-08-26 NOTE — Telephone Encounter (Signed)
Caller: Toni/Patient; Phone: [phone]; Reason for Call: Neurologist, Dr Leta Jungling, told [name] his potassium was low per test 08/19/12 and instructed him to follow up with PCP.  Asking if needs to come in or be retested? Please call back with MD recommendations.

## 2012-08-26 NOTE — Telephone Encounter (Signed)
Pt's wife calling for rx for clonazepam since pt is being seen here now and also they called to Dr. Fredrich Birks office but was told he was on vacation and they wouldn't make appt for them.  He has had to turn his dbs back on a few times from it turning off.

## 2012-08-27 ENCOUNTER — Other Ambulatory Visit: Payer: Self-pay

## 2012-08-27 DIAGNOSIS — Z87442 Personal history of urinary calculi: Secondary | ICD-10-CM | POA: Diagnosis not present

## 2012-08-27 MED ORDER — CLONAZEPAM 0.5 MG PO TABS: 0.5000 mg | ORAL_TABLET | Freq: Every day | ORAL | Status: DC

## 2012-08-27 NOTE — Telephone Encounter (Signed)
Rx signed and faxed.

## 2012-08-27 NOTE — Telephone Encounter (Signed)
Former [facility] patient assigned to Dr Vickey Huger.  She is out of the office, forwarding request to [facility]

## 2012-08-28 NOTE — Telephone Encounter (Signed)
You may call in RX for klonopin.  Is it 1/2 tablet at bedtime?  IF so, go ahead and send 6 month RX.  If on more than 1 po qhs, let me know before you send RX.  Also, I just spoke to Dr. Venetia Maxon about him and he said he would schedule the battery change.  Venetia Maxon is not on vacation now.  If they don't hear from him by end of week, call his office.

## 2012-08-29 ENCOUNTER — Other Ambulatory Visit: Payer: Self-pay | Admitting: Neurosurgery

## 2012-08-29 NOTE — Telephone Encounter (Signed)
Dr. Fredrich Birks office called battery will be changed on 8/28.  Per pt Dr. Charlynn Grimes refilled his clonazepam.  But would prefer to get from her at his next ov.

## 2012-09-02 ENCOUNTER — Encounter (HOSPITAL_COMMUNITY): Payer: Self-pay | Admitting: Pharmacy Technician

## 2012-09-02 NOTE — Pre-Procedure Instructions (Addendum)
Henry [name]  09/02/2012   Your procedure is scheduled on:  August 28,2014 at 1:29 PM  Report to [facility] at 10:30 AM.  Call this number if you have problems the morning of surgery: [phone]   Remember:   Do not eat food or drink liquids after midnight.   Take these medicines the morning of surgery with A SIP OF WATER: pramipexole (MIRAPEX),   levothyroxine (SYNTHROID, LEVOTHROID), if needed: nitroGLYCERIN (NITROSTAT) for chest pain Fluticasone-Salmeterol (ADVAIR) for congestion,  albuterol (VENTOLIN HFA) for wheezing ( Bring inhaler on day of surgery)  Stop all Vitamins, Aspirin, Herbal Medications, Non-steroidal (Ibuprofen, Motrin, Aleve, Naproxen) as of today 09/03/12        Do not wear jewelry, make-up or nail polish.  Do not wear lotions, powders, or perfumes. You may wear deodorant.  Do not shave 48 hours prior to surgery. Men may shave face and neck.  Do not bring valuables to the hospital.  [facility] is not responsible                   for any belongings or valuables.  Contacts, dentures or bridgework may not be worn into surgery.  Leave suitcase in the car. After surgery it may be brought to your room.  For patients admitted to the hospital, checkout time is 11:00 AM the day of  discharge.     Special Instructions: Shower using CHG 2 nights before surgery and the night before surgery.  If you shower the day of surgery use CHG.  Use special wash - you have one bottle of CHG for all showers.  You should use approximately 1/3 of the bottle for each shower.   Please read over the following fact sheets that you were given: Pain Booklet, Coughing and Deep Breathing, MRSA Information and Surgical Site Infection Prevention

## 2012-09-03 ENCOUNTER — Encounter (HOSPITAL_COMMUNITY): Payer: Self-pay

## 2012-09-03 ENCOUNTER — Encounter (HOSPITAL_COMMUNITY)
Admission: RE | Admit: 2012-09-03 | Discharge: 2012-09-03 | Disposition: A | Discharge status: Home / Self Care | Payer: Federal, State, Local not specified - PPO | Source: Ambulatory Visit | Attending: Neurosurgery | Admitting: Neurosurgery

## 2012-09-03 DIAGNOSIS — Z6841 Body Mass Index (BMI) 40.0 and over, adult: Secondary | ICD-10-CM | POA: Diagnosis not present

## 2012-09-03 DIAGNOSIS — I10 Essential (primary) hypertension: Secondary | ICD-10-CM | POA: Diagnosis not present

## 2012-09-03 DIAGNOSIS — Z462 Encounter for fitting and adjustment of other devices related to nervous system and special senses: Secondary | ICD-10-CM | POA: Diagnosis not present

## 2012-09-03 DIAGNOSIS — G20 Parkinson's disease: Secondary | ICD-10-CM | POA: Diagnosis not present

## 2012-09-03 HISTORY — DX: Adverse effect of unspecified anesthetic, initial encounter: T41.45XA

## 2012-09-03 HISTORY — DX: Shortness of breath: R06.02

## 2012-09-03 HISTORY — DX: Unspecified osteoarthritis, unspecified site: M19.90

## 2012-09-03 HISTORY — DX: Other complications of anesthesia, initial encounter: T88.59XA

## 2012-09-03 LAB — BASIC METABOLIC PANEL
CO2: 32 mEq/L (ref 19–32)
GFR calc non Af Amer: >90 mL/min (ref >90)
Glucose, Bld: 220 mg/dL — ABNORMAL HIGH (ref 70–99)
Potassium: 4.1 mEq/L (ref 3.5–5.1)
Sodium: 139 mEq/L (ref 135–145)

## 2012-09-03 LAB — CBC
Hemoglobin: 13.2 g/dL (ref 13.0–17.0)
MCH: 28.1 pg (ref 26.0–34.0)
RBC: 4.7 MIL/uL (ref 4.22–5.81)

## 2012-09-03 NOTE — Progress Notes (Signed)
Pt states that he is short of breath at all times and is under the care of Dr. Jens Som (cardiology) at [facility]. Pt states that he had a cardiac catheterization; results of study were requested from Dr. Cathlean Cower . Pt is positive for sleep apnea but refuses to wear CPAP. Pt chart was left for [place], [place] (anesthesia) to review EKG.

## 2012-09-04 MED ORDER — VANCOMYCIN HCL IN DEXTROSE 1-5 GM/200ML-% IV SOLN
1000.0000 mg | INTRAVENOUS | Status: AC
  Administered 2012-09-05: 1000 mg via INTRAVENOUS
  Filled 2012-09-04: qty 200

## 2012-09-04 NOTE — H&P (Signed)
OUTPATIENT OFFICE NOTE   HOPI:                                                   Henry [name] returns today.  His voltage has decreased to 2.48.   IMPRESSION/PLAN:                             We will plan on having him come back in three months for recheck for consideration of timing of IPG replacement.  He is having problems with his right low back and has a lumbar radiculopathy, which is being treated by Dr. Maurice Small.  He is going to work with Dr. Maurice Small and then will follow up with me if he is having ongoing problems that are not relieved by these interventions.  He wants to have a new neurologist.  Dr. Rana Snare has retired.  He said he is going to go see Dr. Arbutus Leas.  His wife sees a doctor in the [facility] group. I like the idea of consolidating care there.  He will follow up with me in three months for recheck with reassessment of battery life.       _________________________________ Danae Orleans. Venetia Maxon, MD OUTPATIENT OFFICE NOTE   April 17, 2012   Patient Name:                   Henry [name]       #[id number] Date of Birth:                     [birth year]     HOPI:                                                    Henry [name] returns today.  His battery was interrogated.  He has still 2.52 volts.           IMPRESSION/PLAN:                        We are going to hold off on battery replacement.  He will come back to see me in three months for recheck and we will make sure that his voltage is not dropping.              Danae Orleans. Venetia Maxon, MD Henry [name]                          [id number]  DOB:  [birth year] 02/21/2012:                          [name] returns today. He is doing well. He came in to get his battery checked. This shows a slight drop in voltage.  It is currently 2.52 volts. We plan on seeing him back in two months for recheck to make sure that he is not depleting the remaining energy and will need battery change.  He is also complaining of back pain and I reviewed his lumbar  imaging of a CT scan. He is getting injections and says they are  not helping him a lot. I told him to give it time and see how he does, and he will followup with me in two months.                                                                                                     Danae Orleans. 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 Sandria Manly for neurosurgical consultation regarding tremor dominant Parkinson's.  Henry [name] has had a history of Parkinson's for the last five to six years. He says it has been getting worse over the last two years. He is quite frustrated in that he has worked as a Archivist, but has been unable to do so more recently. He says that working around the table saw makes him nervous and when he gets nervous his tremor is a lot worse.  His symptoms are worse on the left side than on the right, but the right side has become a problem over the last one to two years.  He does not note a lot of tremor in his legs. He does note that his arms are more affected than anything else.  He notes that when he is off his medications his tremor is worse.  He does note a feeling of stiffness, but does not complain of significant rigidity or bradykinesia and this does not seem to have been a major feature of his illness.    Henry [name] denies significant problems with his gait.  He denies significant weakness.  He has been taking Mirapex, Stalevo and Selegiline.  He also takes Carbidopa.  His current medication regimen is Selegiline 5 mg. once daily, Artane 2 mg. three times daily, Mirapex 1.5 mg.  in the morning and  at 4:00 PM and one at night, and Carbidopa 25/100 one in the morning and one at 4:00 PM (this is a CR 25/100).  He has had some difficulty  with sleepiness which he feels may be related to medications. He has not had problems with dyskinesia associated with medications.    REVIEW OF SYSTEMS:                                     A detailed Review of Systems sheet was reviewed with the patient.  Pertinent positives include under eyes - he wears glasses, has cataracts; under ears, nose,  mouth, and throat - he notes hearing loss; under cardiovascular - he notes high blood pressure, swelling in his feet and hands; under respiratory - he notes shortness of breath; under genitourinary - he notes kidney stones; under endocrine - he notes diabetes and excessive thirst or urination; otherwise unremarkable.  All other systems are negative; this includes Constitutional symptoms, Gastrointestinal, Genitourinary, Musculoskeletal, Integumentary & Breast, Neurologic, Psychiatric, Hematologic/Lymphatic, Allergic/Immunologic.    PAST MEDICAL HISTORY:                                         Current Medical Conditions:                               His past medical history is significant for high blood pressure, prior heart attack and also had prior bypass surgery.  He has a history of diabetes.          Prior Operations and Hospitalizations:            He had a bypass surgery in 2000 by Dr. Cornelius Moras.  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             Medications and Allergies:                   He is ALLERGIC TO PENICILLIN WHICH CAUSES A RASH.  Current medications are a Baby Aspirin 81 mg. daily, antioxidant vitamin once daily.  From Dr. Sandria Manly he takes the Parkinson's medications as previously described. Additionally he takes from Drs. Hopper and [employer] - Aciphex 20 mg. two times daily, Lescol 80 mg. one tablet twice daily, Potassium CL 20 mEq. one tablet two times daily, Amaryl 2 mg. once daily, Avandia 4 mg. once daily, Cozaar 50 mg. once daily, Metoprolol 50 mg.  tablet two times daily, and Furosemide 80 mg. two times  daily.             Height and Weight:                                                 He is currently 5'8" tall, 293 lbs.    SOCIAL HISTORY:                                                              He is a nonsmoker, nondrinker, and no history of substance abuse.                                           PHYSICAL EXAMINATION:                                       General Appearance:  On examination today, Henry [name] is a pleasant, cooperative, middle-aged, white male in no acute distress.  He is obese and speaks clearly, but with masked facies.             Blood Pressure, Pulse:                                           Blood pressure is 114/78 in the right arm seated. Heart rate is 84 and regular.             HEENT - normocephalic, atraumatic.  The pupils are equal, round and reactive to light.  The extraocular muscles are intact.  Sclerae - white.  Conjunctiva - pink.  Oropharynx benign.  Uvula midline.            Neck - there are no masses, meningismus, deformities, tracheal deviation, jugular vein distention or carotid bruits.  There is normal cervical range of motion.  Spurlings' test is negative without reproducible radicular pain turning the patient's head to either side.  Lhermitte's sign is not present with axial compression.             Respiratory - there is normal respiratory effort with good intercostal function.  Lungs are clear to auscultation.  There are no rales, rhonchi or wheezes.             Cardiovascular - the heart has regular rate and rhythm to auscultation.  No murmurs are appreciated.  There is no extremity edema, cyanosis or clubbing.  There are palpable pedal pulses.            Abdomen - obese, soft, nontender, no hepatosplenomegaly appreciated or masses.  There are active bowel sounds.  No guarding or rebound.             Musculoskeletal Examination - the patient is able to walk about the examining room with a  normal, casual, heel and toe gait.    NEUROLOGICAL EXAMINATION:               The patient is oriented to time, person and place and has good recall of both recent and remote memory with normal attention span and concentration.  The patient speaks with clear and fluent speech and exhibits normal language function and appropriate fund of knowledge.             Cranial Nerve Examination - He has masked facies. The pupils are equal, round and reactive to light.  Extraocular movements are intact.  Visual fields are full to confrontational testing.  Facial sensation and facial movement are symmetric and intact.  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  His facial nerve appears to be working on the right; it is symmetric.               Motor Examination - He has normal bulk and tone in the upper and lower extremities.  He has a rest tremor which is increased with agitation and anxiety and is worse on the left than on the right.  It is somewhat spasmodic. Motor strength is 5/5 in the bilateral deltoids, biceps, triceps, handgrips, wrist extensors, interosseous.  In the lower extremities motor strength is 5/5 in hip flexion, extension, quadriceps, hamstrings, plantar flexion, dorsiflexion and extensor hallucis longus.             Sensory Examination - normal to light touch  and pinprick sensation in the upper and lower extremities.            Deep Tendon Reflexes - 2 in the biceps, triceps, and brachioradialis, 2 in the knees, 2 in the ankles.  The great toes are downgoing to plantar stimulation.              Cerebellar Examination - normal coordination in upper and lower extremities and normal rapid alternating movements.  Romberg test is negative.    IMPRESSION AND RECOMMENDATIONS:             Henry [name] is a 62 year old man with Parkinson's disease.  His symptoms are tremor dominant.  I do believe he would be a good candidate for deep brain stimulation and would recommend VIM thalamic nucleus  stimulation.  His left side is worse than his right, but he complains that both sides are bothering him.  He wishes to go ahead with surgery and I would like to have him cleared by his cardiologist before proceeding with further treatment.  He will be seen by his cardiologist in the near future and then will have a Stealth guided MRI of his brain.  We will make further plans for the surgical procedure. I discussed with him the details of the surgical procedure including microelectrode recording and how it is a two-stage procedure with placement of electrodes done with the patient awake and then subsequently coming back for placement of am implantable pulse generator. He wishes to proceed. We discussed potential risks of the surgery which include bleeding, infection, damage to nerves and vessels, weakness, paralysis, hemorrhage, stroke, death. He understands these risks.    [facility]       Danae Orleans. Venetia Maxon, M.D.

## 2012-09-04 NOTE — Progress Notes (Signed)
Pt notified to arrive at1230pm

## 2012-09-04 NOTE — Progress Notes (Signed)
Anesthesia Chart Review:  Patient is a 62 year old male scheduled for deep brain stimulator battery change on 09/05/12 by Dr. Venetia Maxon.  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 PCP is Dr. Alwyn Ren.  Cardiologist is Dr. Jens Som  Nuclear stress test on 09/04/11 showed: Medium to large area of scar in the anterior/anteroseptal and apical walls. No ischemia. LV Ejection Fraction: 41%. LV Wall Motion: Hypokinesis of the anteroseptal, anterior , distal inferor walls.   Echo on 05/30/11 showed: - Left ventricle: The cavity size was mildly dilated. Wall thickness was normal. Systolic function was moderately reduced. The estimated ejection fraction was in the range of 35% to 40%. There is hypokinesis of the anteroseptal myocardium. There is hypokinesis of the apical myocardium. Features are consistent with a pseudonormal left ventricular filling pattern, with concomitant abnormal relaxation and increased filling pressure (grade 2 diastolic dysfunction). - Left atrium: The atrium was mildly dilated. - Right ventricle: The cavity size was mildly dilated. - Right atrium: The atrium was mildly dilated.  EKG on 09/03/12 showed SR with PACs, cannot rule out anterior infarct (age undetermined), ST/T wave abnormality, consider inferolateral ischemia. Baseline artifact limits interpretation--this is particularly true in inferior leads.  There are multiple EKGs available in [facility] for comparison.  His baseline EKG on 09/04/11 (at the time of his non-ischemic stress test) also showed inferolateral T wave inversion--so overall I think his EKG is stable.  CXR on 01/09/12 showed no active disease.  Preoperative  labs noted.  Patient had a stress and echo just over a year ago.  Stress showed scar but no ischemia.  EF was 35-40% which was stable when compared to his EF from 2009.  He will be evaluated by his assigned anesthesiologist on the day of surgery, but if no acute changes then I would anticipate that he could proceed as planned.  Velna Ochs [facility]/Anesthesiology Phone [phone] 09/04/2012 11:04 AM

## 2012-09-05 ENCOUNTER — Encounter (HOSPITAL_COMMUNITY): Payer: Self-pay | Admitting: Vascular Surgery

## 2012-09-05 ENCOUNTER — Encounter (HOSPITAL_COMMUNITY)
Admission: RE | Disposition: A | Discharge status: Home / Self Care | Payer: Self-pay | Source: Ambulatory Visit | Attending: Neurosurgery

## 2012-09-05 ENCOUNTER — Ambulatory Visit (HOSPITAL_COMMUNITY)
Admission: RE | Admit: 2012-09-05 | Discharge: 2012-09-05 | DRG: 008 | Disposition: A | Discharge status: Home / Self Care | Payer: Federal, State, Local not specified - PPO | Source: Ambulatory Visit | Attending: Neurosurgery | Admitting: Neurosurgery

## 2012-09-05 ENCOUNTER — Inpatient Hospital Stay (HOSPITAL_COMMUNITY): Payer: Federal, State, Local not specified - PPO | Admitting: Certified Registered Nurse Anesthetist

## 2012-09-05 DIAGNOSIS — I10 Essential (primary) hypertension: Secondary | ICD-10-CM | POA: Diagnosis present

## 2012-09-05 DIAGNOSIS — Z01812 Encounter for preprocedural laboratory examination: Secondary | ICD-10-CM

## 2012-09-05 DIAGNOSIS — E119 Type 2 diabetes mellitus without complications: Secondary | ICD-10-CM | POA: Diagnosis present

## 2012-09-05 DIAGNOSIS — I2510 Atherosclerotic heart disease of native coronary artery without angina pectoris: Secondary | ICD-10-CM | POA: Diagnosis not present

## 2012-09-05 DIAGNOSIS — K219 Gastro-esophageal reflux disease without esophagitis: Secondary | ICD-10-CM | POA: Diagnosis present

## 2012-09-05 DIAGNOSIS — Z01810 Encounter for preprocedural cardiovascular examination: Secondary | ICD-10-CM

## 2012-09-05 DIAGNOSIS — E039 Hypothyroidism, unspecified: Secondary | ICD-10-CM | POA: Diagnosis present

## 2012-09-05 DIAGNOSIS — G20 Parkinson's disease: Secondary | ICD-10-CM | POA: Diagnosis not present

## 2012-09-05 DIAGNOSIS — G20A1 Parkinson's disease without dyskinesia, without mention of fluctuations: Secondary | ICD-10-CM | POA: Diagnosis present

## 2012-09-05 DIAGNOSIS — E669 Obesity, unspecified: Secondary | ICD-10-CM | POA: Diagnosis present

## 2012-09-05 DIAGNOSIS — Z6841 Body Mass Index (BMI) 40.0 and over, adult: Secondary | ICD-10-CM | POA: Diagnosis not present

## 2012-09-05 DIAGNOSIS — Z462 Encounter for fitting and adjustment of other devices related to nervous system and special senses: Secondary | ICD-10-CM | POA: Diagnosis not present

## 2012-09-05 HISTORY — PX: SUBTHALAMIC STIMULATOR BATTERY REPLACEMENT: SHX5405

## 2012-09-05 LAB — GLUCOSE, CAPILLARY: Glucose-Capillary: 127 mg/dL — ABNORMAL HIGH (ref 70–99)

## 2012-09-05 SURGERY — SUBTHALAMIC STIMULATOR BATTERY REPLACEMENT
Anesthesia: General | Site: Chest | Wound class: Clean

## 2012-09-05 MED ORDER — MIDAZOLAM HCL 5 MG/5ML IJ SOLN
INTRAMUSCULAR | Status: DC | PRN
  Administered 2012-09-05: 2 mg via INTRAVENOUS

## 2012-09-05 MED ORDER — MEPERIDINE HCL 25 MG/ML IJ SOLN: 6.2500 mg | INTRAMUSCULAR | Status: DC | PRN

## 2012-09-05 MED ORDER — LACTATED RINGERS IV SOLN
INTRAVENOUS | Status: DC
  Administered 2012-09-05: 13:00:00 via INTRAVENOUS

## 2012-09-05 MED ORDER — PROPOFOL INFUSION 10 MG/ML OPTIME
INTRAVENOUS | Status: DC | PRN
  Administered 2012-09-05: 120 ug/kg/min via INTRAVENOUS

## 2012-09-05 MED ORDER — LIDOCAINE-EPINEPHRINE 1 %-1:100000 IJ SOLN
INTRAMUSCULAR | Status: DC | PRN
  Administered 2012-09-05: 10 mL

## 2012-09-05 MED ORDER — 0.9 % SODIUM CHLORIDE (POUR BTL) OPTIME
TOPICAL | Status: DC | PRN
  Administered 2012-09-05: 1000 mL

## 2012-09-05 MED ORDER — FENTANYL CITRATE 0.05 MG/ML IJ SOLN: 25.0000 ug | INTRAMUSCULAR | Status: DC | PRN

## 2012-09-05 MED ORDER — ONDANSETRON HCL 4 MG/2ML IJ SOLN
INTRAMUSCULAR | Status: DC | PRN
  Administered 2012-09-05: 4 mg via INTRAVENOUS

## 2012-09-05 MED ORDER — LACTATED RINGERS IV SOLN
INTRAVENOUS | Status: DC | PRN
  Administered 2012-09-05: 17:00:00 via INTRAVENOUS

## 2012-09-05 MED ORDER — FENTANYL CITRATE 0.05 MG/ML IJ SOLN
INTRAMUSCULAR | Status: DC | PRN
  Administered 2012-09-05: 50 ug via INTRAVENOUS

## 2012-09-05 MED ORDER — PROMETHAZINE HCL 25 MG/ML IJ SOLN: 6.2500 mg | INTRAMUSCULAR | Status: DC | PRN

## 2012-09-05 MED ORDER — BUPIVACAINE HCL (PF) 0.5 % IJ SOLN
INTRAMUSCULAR | Status: DC | PRN
  Administered 2012-09-05: 10 mL

## 2012-09-05 MED ORDER — VANCOMYCIN HCL 1000 MG IV SOLR
INTRAVENOUS | Status: AC
  Filled 2012-09-05: qty 1000

## 2012-09-05 MED ORDER — VANCOMYCIN HCL 1000 MG IV SOLR
INTRAVENOUS | Status: DC | PRN
  Administered 2012-09-05: 1000 mg

## 2012-09-05 MED ORDER — OXYCODONE HCL 5 MG/5ML PO SOLN: 5.0000 mg | Freq: Once | ORAL | Status: DC | PRN

## 2012-09-05 MED ORDER — OXYCODONE HCL 5 MG PO TABS: 5.0000 mg | ORAL_TABLET | Freq: Once | ORAL | Status: DC | PRN

## 2012-09-05 MED ORDER — MIDAZOLAM HCL 2 MG/2ML IJ SOLN: 0.5000 mg | Freq: Once | INTRAMUSCULAR | Status: DC | PRN

## 2012-09-05 SURGICAL SUPPLY — 62 items
ADH SKN CLS LQ APL DERMABOND (GAUZE/BANDAGES/DRESSINGS) ×1
BAG DECANTER FOR FLEXI CONT (MISCELLANEOUS) ×1 IMPLANT
BLADE SURG 10 STRL SS (BLADE) ×2 IMPLANT
BLADE SURG 15 STRL LF DISP TIS (BLADE) ×1 IMPLANT
BLADE SURG 15 STRL SS (BLADE) ×2
BOOT SUTURE AID YELLOW STND (SUTURE) ×1 IMPLANT
CANISTER SUCTION 2500CC (MISCELLANEOUS) ×2 IMPLANT
CLOTH BEACON ORANGE TIMEOUT ST (SAFETY) ×2 IMPLANT
CORDS BIPOLAR (ELECTRODE) ×2 IMPLANT
DERMABOND ADHESIVE PROPEN (GAUZE/BANDAGES/DRESSINGS) ×1
DERMABOND ADVANCED .7 DNX6 (GAUZE/BANDAGES/DRESSINGS) IMPLANT
DRAPE INCISE IOBAN 66X45 STRL (DRAPES) ×2 IMPLANT
DRAPE LAPAROTOMY 100X72 PEDS (DRAPES) ×2 IMPLANT
DRAPE POUCH INSTRU U-SHP 10X18 (DRAPES) ×2 IMPLANT
DRESSING TELFA 8X3 (GAUZE/BANDAGES/DRESSINGS) ×1 IMPLANT
DRSG OPSITE 4X5.5 SM (GAUZE/BANDAGES/DRESSINGS) IMPLANT
DRSG OPSITE POSTOP 4X6 (GAUZE/BANDAGES/DRESSINGS) ×1 IMPLANT
ELECT CAUTERY BLADE 6.4 (BLADE) ×2 IMPLANT
GAUZE SPONGE 4X4 16PLY XRAY LF (GAUZE/BANDAGES/DRESSINGS) ×2 IMPLANT
GLOVE BIO SURGEON STRL SZ8 (GLOVE) ×3 IMPLANT
GLOVE BIOGEL PI IND STRL 7.5 (GLOVE) IMPLANT
GLOVE BIOGEL PI IND STRL 8 (GLOVE) ×1 IMPLANT
GLOVE BIOGEL PI IND STRL 8.5 (GLOVE) ×1 IMPLANT
GLOVE BIOGEL PI INDICATOR 7.5 (GLOVE) ×1
GLOVE BIOGEL PI INDICATOR 8 (GLOVE)
GLOVE BIOGEL PI INDICATOR 8.5 (GLOVE) ×1
GLOVE ECLIPSE 7.5 STRL STRAW (GLOVE) ×4 IMPLANT
GLOVE EXAM NITRILE LRG STRL (GLOVE) IMPLANT
GLOVE EXAM NITRILE MD LF STRL (GLOVE) ×1 IMPLANT
GLOVE EXAM NITRILE XL STR (GLOVE) IMPLANT
GLOVE EXAM NITRILE XS STR PU (GLOVE) IMPLANT
GOWN BRE IMP SLV AUR LG STRL (GOWN DISPOSABLE) IMPLANT
GOWN BRE IMP SLV AUR XL STRL (GOWN DISPOSABLE) ×3 IMPLANT
GOWN STRL REIN 2XL LVL4 (GOWN DISPOSABLE) ×2 IMPLANT
KIT BASIN OR (CUSTOM PROCEDURE TRAY) ×2 IMPLANT
KIT ROOM TURNOVER OR (KITS) ×2 IMPLANT
NDL HYPO 25X1 1.5 SAFETY (NEEDLE) ×1 IMPLANT
NEEDLE HYPO 25X1 1.5 SAFETY (NEEDLE) ×2 IMPLANT
NEUROSTIM OCTOPOLAR ~~LOC~~ 49X65 (Neuro Prosthesis/Implant) ×1 IMPLANT
NEUROSTIM PROGRAMMER 2.2X3.7 (Neuro Prosthesis/Implant) ×1 IMPLANT
NS IRRIG 1000ML POUR BTL (IV SOLUTION) ×2 IMPLANT
PACK SURGICAL SETUP 50X90 (CUSTOM PROCEDURE TRAY) ×2 IMPLANT
PAD ARMBOARD 7.5X6 YLW CONV (MISCELLANEOUS) ×6 IMPLANT
PATIENT PROGRAMMER ×1 IMPLANT
PENCIL BUTTON HOLSTER BLD 10FT (ELECTRODE) ×2 IMPLANT
Pocket Adaptor For Deep Brain Stimulation ×1 IMPLANT
SPONGE GAUZE 4X4 12PLY (GAUZE/BANDAGES/DRESSINGS) ×1 IMPLANT
STAPLER SKIN PROX WIDE 3.9 (STAPLE) ×1 IMPLANT
STOCKINETTE 4X48 STRL (DRAPES) ×1 IMPLANT
STRIP CLOSURE SKIN 1/2X4 (GAUZE/BANDAGES/DRESSINGS) ×1 IMPLANT
SUT ETHILON 3 0 PS 1 (SUTURE) ×2 IMPLANT
SUT SILK 2 0 TIES 10X30 (SUTURE) ×1 IMPLANT
SUT VIC AB 2-0 CP2 18 (SUTURE) ×1 IMPLANT
SUT VIC AB 2-0 CT1 18 (SUTURE) ×1 IMPLANT
SUT VIC AB 3-0 SH 8-18 (SUTURE) ×3 IMPLANT
SYR BULB 3OZ (MISCELLANEOUS) ×2 IMPLANT
SYR CONTROL 10ML LL (SYRINGE) ×2 IMPLANT
TOWEL OR 17X24 6PK STRL BLUE (TOWEL DISPOSABLE) ×2 IMPLANT
TOWEL OR 17X26 10 PK STRL BLUE (TOWEL DISPOSABLE) ×2 IMPLANT
TUBE CONNECTING 12X1/4 (SUCTIONS) ×2 IMPLANT
UNDERPAD 30X30 INCONTINENT (UNDERPADS AND DIAPERS) ×1 IMPLANT
WATER STERILE IRR 1000ML POUR (IV SOLUTION) ×2 IMPLANT

## 2012-09-05 NOTE — Anesthesia Preprocedure Evaluation (Addendum)
Anesthesia Evaluation  Patient identified by MRN, date of birth, ID band Patient awake    Reviewed: Allergy & Precautions, H&P , NPO status , Patient's Chart, lab work & pertinent test results, reviewed documented beta blocker date and time   History of Anesthesia Complications Negative for: history of anesthetic complications  Airway Mallampati: II TM Distance: >3 FB Neck ROM: Full    Dental  (+) Dental Advisory Given   Pulmonary shortness of breath, sleep apnea (non-compliant with CPAP, AHI 28) and Continuous Positive Airway Pressure Ventilation ,  breath sounds clear to auscultation  Pulmonary exam normal       Cardiovascular hypertension, Pt. on medications and Pt. on home beta blockers - angina+ CAD, + Past MI, + Cardiac Stents ('98) and + CABG (2000) Rhythm:Regular Rate:Normal  '13 ECHO: anteroseptal and apical hypokinesis, EF 35-40%, valves OK '13 stress test: anterior scar, no ischemia, EF 41%   Neuro/Psych Parkinson's: deep brain stim    GI/Hepatic Neg liver ROS, GERD-  Medicated and Controlled,  Endo/Other  diabetes (glu 164), Well Controlled, Type 2, Oral Hypoglycemic AgentsHypothyroidism   Renal/GU      Musculoskeletal   Abdominal (+) + obese,   Peds  Hematology   Anesthesia Other Findings   Reproductive/Obstetrics                          Anesthesia Physical Anesthesia Plan  ASA: III  Anesthesia Plan: MAC   Post-op Pain Management:    Induction:   Airway Management Planned: Natural Airway and Simple Face Mask  Additional Equipment:   Intra-op Plan:   Post-operative Plan:   Informed Consent: I have reviewed the patients History and Physical, chart, labs and discussed the procedure including the risks, benefits and alternatives for the proposed anesthesia with the patient or authorized representative who has indicated his/her understanding and acceptance.   Dental  advisory given  Plan Discussed with: CRNA and Surgeon  Anesthesia Plan Comments: (Plan routine monitors, MAC)        Anesthesia Quick Evaluation

## 2012-09-05 NOTE — Interval H&P Note (Signed)
History and Physical Interval Note:  09/05/2012 10:28 AM  Henry [name]  has presented today for surgery, with the diagnosis of Parkinson's  The various methods of treatment have been discussed with the patient and family. After consideration of risks, benefits and other options for treatment, the patient has consented to  Procedure(s) with comments: SUBTHALAMIC STIMULATOR BATTERY REPLACEMENT (N/A) - Deep brain stimulator battery change as a surgical intervention .  The patient's history has been reviewed, patient examined, no change in status, stable for surgery.  I have reviewed the patient's chart and labs.  Questions were answered to the patient's satisfaction.     Zaylan Kissoon D

## 2012-09-05 NOTE — Transfer of Care (Signed)
Immediate Anesthesia Transfer of Care Note  Patient: Henry [name]  Procedure(s) Performed: Procedure(s) with comments: Deep brain stimulator battery change (N/A) - Deep brain stimulator battery change  Patient Location: PACU  Anesthesia Type:MAC  Level of Consciousness: awake, alert  and oriented  Airway & Oxygen Therapy: Patient Spontanous Breathing  Post-op Assessment: Report given to PACU RN, Post -op Vital signs reviewed and stable and Patient moving all extremities  Post vital signs: Reviewed and stable  Complications: No apparent anesthesia complications

## 2012-09-05 NOTE — Anesthesia Postprocedure Evaluation (Signed)
  Anesthesia Post-op Note  Patient: Henry [name]  Procedure(s) Performed: Procedure(s) with comments: Deep brain stimulator battery change (N/A) - Deep brain stimulator battery change  Patient Location: PACU  Anesthesia Type:General  Level of Consciousness: awake, alert  and oriented  Airway and Oxygen Therapy: Patient Spontanous Breathing and Patient connected to nasal cannula oxygen  Post-op Pain: mild  Post-op Assessment: Post-op Vital signs reviewed and Patient's Cardiovascular Status Stable  Post-op Vital Signs: stable  Complications: No apparent anesthesia complications

## 2012-09-05 NOTE — Op Note (Signed)
09/05/2012  5:51 PM  PATIENT:  Henry [name]  62 y.o. male  PRE-OPERATIVE DIAGNOSIS:  Parkinson's Disease  POST-OPERATIVE DIAGNOSIS:  Parkinson's Disease  PROCEDURE:  Procedure(s) with comments: Deep brain stimulator battery change (N/A) - Deep brain stimulator battery change  SURGEON:  Surgeon(s) and Role:    * Avalynn Bowe, MD - Primary  PHYSICIAN ASSISTANT:   ASSISTANTS: none   ANESTHESIA:   IV sedation  EBL:  Total I/O In: 500 [I.V.:500] Out: -   BLOOD ADMINISTERED:none  DRAINS: none   LOCAL MEDICATIONS USED:  LIDOCAINE   SPECIMEN:  No Specimen  DISPOSITION OF SPECIMEN:  N/A  COUNTS:  YES  TOURNIQUET:  * No tourniquets in log *  DICTATION: DICTATION: Patient has implanted subthalamic stimulator electrodes and IPG, which is now depleted.  It was elected for patient to undergo IPG revision.  PROCEDURE: Patient was brought to the operating room and given intravenous sedation.  Right upper chest was prepped with betadine scrub and and Duraprep.  Area of planned incision was infiltrated with lidocaine.  Prior incision was reopened and the old IPG was externalized.  Adaptor was connected to new IPG which was placed in the pocket.  Wound was irrigated with vancomycin. Then irrigated once more.  Incision was closed with 2-0 Vicryl and 3-0 vicryl sutures and dressed with a sterile occlusive dressing.  Counts were correct at the end of the case.   PLAN OF CARE: Discharge to home after PACU  PATIENT DISPOSITION:  PACU - hemodynamically stable.   Delay start of Pharmacological VTE agent (>24hrs) due to surgical blood loss or risk of bleeding: yes  

## 2012-09-05 NOTE — Anesthesia Procedure Notes (Signed)
Procedure Name: MAC Date/Time: 09/05/2012 4:45 PM Performed by: Orvilla Fus A Pre-anesthesia Checklist: Patient identified, Timeout performed, Emergency Drugs available, Suction available and Patient being monitored Patient Re-evaluated:Patient Re-evaluated prior to inductionOxygen Delivery Method: Simple face mask Placement Confirmation: positive ETCO2

## 2012-09-05 NOTE — Progress Notes (Signed)
Awake, alert, conversant.  Doing well.  New IPG on.

## 2012-09-05 NOTE — Brief Op Note (Signed)
09/05/2012  5:51 PM  PATIENT:  Henry [name]  62 y.o. male  PRE-OPERATIVE DIAGNOSIS:  Parkinson's Disease  POST-OPERATIVE DIAGNOSIS:  Parkinson's Disease  PROCEDURE:  Procedure(s) with comments: Deep brain stimulator battery change (N/A) - Deep brain stimulator battery change  SURGEON:  Surgeon(s) and Role:    * Maeola Harman, MD - Primary  PHYSICIAN ASSISTANT:   ASSISTANTS: none   ANESTHESIA:   IV sedation  EBL:  Total I/O In: 500 [I.V.:500] Out: -   BLOOD ADMINISTERED:none  DRAINS: none   LOCAL MEDICATIONS USED:  LIDOCAINE   SPECIMEN:  No Specimen  DISPOSITION OF SPECIMEN:  N/A  COUNTS:  YES  TOURNIQUET:  * No tourniquets in log *  DICTATION: DICTATION: Patient has implanted subthalamic stimulator electrodes and IPG, which is now depleted.  It was elected for patient to undergo IPG revision.  PROCEDURE: Patient was brought to the operating room and given intravenous sedation.  Right upper chest was prepped with betadine scrub and and Duraprep.  Area of planned incision was infiltrated with lidocaine.  Prior incision was reopened and the old IPG was externalized.  Adaptor was connected to new IPG which was placed in the pocket.  Wound was irrigated with vancomycin. Then irrigated once more.  Incision was closed with 2-0 Vicryl and 3-0 vicryl sutures and dressed with a sterile occlusive dressing.  Counts were correct at the end of the case.   PLAN OF CARE: Discharge to home after PACU  PATIENT DISPOSITION:  PACU - hemodynamically stable.   Delay start of Pharmacological VTE agent (>24hrs) due to surgical blood loss or risk of bleeding: yes

## 2012-09-06 ENCOUNTER — Encounter (HOSPITAL_COMMUNITY): Payer: Self-pay | Admitting: Neurosurgery

## 2012-09-12 NOTE — Discharge Summary (Signed)
Physician Discharge Summary  Patient ID: Henry [name] MRN: [id number] DOB/AGE: 61-Jun-1952 61 y.o.  Admit date: 09/05/2012 Discharge date: 09/12/2012  Admission Diagnoses:Parkinson's Disease  Discharge Diagnoses: Same Active Problems:   * No active hospital problems. *   Discharged Condition: good  Hospital Course: Underwent Implantable pulse generator revision for Deep Brain Stimulator for Parkinson's Disease  Consults: None  Significant Diagnostic Studies: None  Treatments: surgery:  Implantable pulse generator revision for Deep Brain Stimulator for Parkinson's Disease  Discharge Exam: Blood pressure 115/68, pulse 61, temperature 98.1 F (36.7 C), temperature source Oral, resp. rate 18, SpO2 94.00%. Neurologic: Alert and oriented X 3, normal strength and tone. Normal symmetric reflexes. Normal coordination and gait Wound:Dressing CDI  Disposition: Home   Future Appointments Provider Department Dept Phone   10/02/2012 10:45 AM Octaviano Batty Tat, DO [facility]  [phone]       Medication List    ASK your doctor about these medications       albuterol 108 (90 BASE) MCG/ACT inhaler  Commonly known as:  VENTOLIN HFA  Inhale 2 puffs into the lungs every 6 (six) hours as needed for wheezing.     aspirin EC 81 MG tablet  Take 81 mg by mouth daily.     clonazePAM 0.5 MG tablet  Commonly known as:  KLONOPIN  Take 1 tablet (0.5 mg total) by mouth at bedtime. When using CPAP     Fluticasone-Salmeterol 250-50 MCG/DOSE Aepb  Commonly known as:  ADVAIR  Inhale 1 puff into the lungs 2 (two) times daily as needed (for congestion).     furosemide 80 MG tablet  Commonly known as:  LASIX  Take 40-80 mg by mouth 2 (two) times daily. 1 tablet (80 mg) every morning and 1/2 tablet (40 mg) at night     glimepiride 2 MG tablet  Commonly known as:  AMARYL  Take 1-2 mg by mouth daily before breakfast. 1 tablet (2 mg) every morning and 1/2 tablet (1 mg) at night      ibuprofen 200 MG tablet  Commonly known as:  ADVIL,MOTRIN  Take 800 mg by mouth daily as needed for pain.     levothyroxine 125 MCG tablet  Commonly known as:  SYNTHROID, LEVOTHROID  Take 125 mcg by mouth daily before breakfast.     losartan 50 MG tablet  Commonly known as:  COZAAR  Take 50 mg by mouth at bedtime.     metoprolol succinate 50 MG 24 hr tablet  Commonly known as:  TOPROL-XL  Take 50 mg by mouth at bedtime. Take with or immediately following a meal.     multivitamin with minerals Tabs tablet  Take 1 tablet by mouth daily. Antioxidant  Multi vitamin     nitroGLYCERIN 0.4 MG SL tablet  Commonly known as:  NITROSTAT  Place 1 tablet (0.4 mg total) under the tongue every 5 (five) minutes as needed for chest pain.     potassium chloride SA 20 MEQ tablet  Commonly known as:  K-DUR,KLOR-CON  Take 60 mEq by mouth at bedtime.     pramipexole 0.5 MG tablet  Commonly known as:  MIRAPEX  Take 0.5 mg by mouth 2 (two) times daily.     rosuvastatin 20 MG tablet  Commonly known as:  CRESTOR  Take 20 mg by mouth daily.     sennosides-docusate sodium 8.6-50 MG tablet  Commonly known as:  SENOKOT-S  Take 1 tablet by mouth at bedtime.     sitaGLIPtin-metformin 50-1000  MG per tablet  Commonly known as:  JANUMET  Take 1 tablet by mouth 2 (two) times daily with a meal.     SYSTANE OP  Place 1 drop into the left eye daily as needed (dry eye).     traMADol 50 MG tablet  Commonly known as:  ULTRAM  Take 50 mg by mouth 2 (two) times daily as needed for pain.     travoprost (benzalkonium) 0.004 % ophthalmic solution  Commonly known as:  TRAVATAN  Place 1 drop into the right eye at bedtime.     trihexyphenidyl 2 MG tablet  Commonly known as:  ARTANE  Take 2 mg by mouth 2 (two) times daily with a meal.     vitamin B-12 1000 MCG tablet  Commonly known as:  CYANOCOBALAMIN  Take 1,000 mcg by mouth daily.         Signed: Dorian Heckle, MD 09/12/2012, 6:50 PM

## 2012-09-14 ENCOUNTER — Encounter (HOSPITAL_COMMUNITY): Payer: Self-pay | Admitting: *Deleted

## 2012-09-14 ENCOUNTER — Emergency Department (HOSPITAL_COMMUNITY)
Admission: EM | Admit: 2012-09-14 | Discharge: 2012-09-15 | Disposition: A | Discharge status: Home / Self Care | Payer: Federal, State, Local not specified - PPO | Attending: Emergency Medicine | Admitting: Emergency Medicine

## 2012-09-14 ENCOUNTER — Emergency Department (HOSPITAL_COMMUNITY): Payer: Federal, State, Local not specified - PPO

## 2012-09-14 DIAGNOSIS — G20A1 Parkinson's disease without dyskinesia, without mention of fluctuations: Secondary | ICD-10-CM | POA: Insufficient documentation

## 2012-09-14 DIAGNOSIS — Z7982 Long term (current) use of aspirin: Secondary | ICD-10-CM | POA: Insufficient documentation

## 2012-09-14 DIAGNOSIS — Z87442 Personal history of urinary calculi: Secondary | ICD-10-CM | POA: Diagnosis not present

## 2012-09-14 DIAGNOSIS — Z880 Allergy status to penicillin: Secondary | ICD-10-CM | POA: Diagnosis not present

## 2012-09-14 DIAGNOSIS — Z79899 Other long term (current) drug therapy: Secondary | ICD-10-CM | POA: Insufficient documentation

## 2012-09-14 DIAGNOSIS — I10 Essential (primary) hypertension: Secondary | ICD-10-CM | POA: Insufficient documentation

## 2012-09-14 DIAGNOSIS — E039 Hypothyroidism, unspecified: Secondary | ICD-10-CM | POA: Insufficient documentation

## 2012-09-14 DIAGNOSIS — Z9861 Coronary angioplasty status: Secondary | ICD-10-CM | POA: Insufficient documentation

## 2012-09-14 DIAGNOSIS — E785 Hyperlipidemia, unspecified: Secondary | ICD-10-CM | POA: Insufficient documentation

## 2012-09-14 DIAGNOSIS — E119 Type 2 diabetes mellitus without complications: Secondary | ICD-10-CM | POA: Diagnosis not present

## 2012-09-14 DIAGNOSIS — Z8719 Personal history of other diseases of the digestive system: Secondary | ICD-10-CM | POA: Diagnosis not present

## 2012-09-14 DIAGNOSIS — R0789 Other chest pain: Secondary | ICD-10-CM | POA: Diagnosis not present

## 2012-09-14 DIAGNOSIS — N390 Urinary tract infection, site not specified: Secondary | ICD-10-CM | POA: Insufficient documentation

## 2012-09-14 DIAGNOSIS — R509 Fever, unspecified: Secondary | ICD-10-CM | POA: Diagnosis not present

## 2012-09-14 DIAGNOSIS — Z860100 Personal history of colon polyps, unspecified: Secondary | ICD-10-CM | POA: Insufficient documentation

## 2012-09-14 DIAGNOSIS — G4733 Obstructive sleep apnea (adult) (pediatric): Secondary | ICD-10-CM | POA: Insufficient documentation

## 2012-09-14 DIAGNOSIS — I252 Old myocardial infarction: Secondary | ICD-10-CM | POA: Insufficient documentation

## 2012-09-14 DIAGNOSIS — I2510 Atherosclerotic heart disease of native coronary artery without angina pectoris: Secondary | ICD-10-CM | POA: Diagnosis not present

## 2012-09-14 DIAGNOSIS — M129 Arthropathy, unspecified: Secondary | ICD-10-CM | POA: Diagnosis not present

## 2012-09-14 DIAGNOSIS — R079 Chest pain, unspecified: Secondary | ICD-10-CM | POA: Diagnosis not present

## 2012-09-14 DIAGNOSIS — Z951 Presence of aortocoronary bypass graft: Secondary | ICD-10-CM | POA: Diagnosis not present

## 2012-09-14 DIAGNOSIS — G20 Parkinson's disease: Secondary | ICD-10-CM | POA: Insufficient documentation

## 2012-09-14 LAB — CBC WITH DIFFERENTIAL/PLATELET
Basophils Absolute: 0 10*3/uL (ref 0.0–0.1)
Basophils Relative: 0 % (ref 0–1)
Lymphocytes Relative: 13 % (ref 12–46)
MCHC: 34.7 g/dL (ref 30.0–36.0)
Neutro Abs: 9.1 10*3/uL — ABNORMAL HIGH (ref 1.7–7.7)
Neutrophils Relative %: 78 % — ABNORMAL HIGH (ref 43–77)
Platelets: 151 10*3/uL (ref 150–400)
RDW: 13.9 % (ref 11.5–15.5)
WBC: 11.7 10*3/uL — ABNORMAL HIGH (ref 4.0–10.5)

## 2012-09-14 LAB — COMPREHENSIVE METABOLIC PANEL
ALT: 17 U/L (ref 0–53)
AST: 12 U/L (ref 0–37)
Albumin: 3.6 g/dL (ref 3.5–5.2)
Alkaline Phosphatase: 73 U/L (ref 39–117)
CO2: 27 mEq/L (ref 19–32)
Chloride: 99 mEq/L (ref 96–112)
Creatinine, Ser: 0.98 mg/dL (ref 0.50–1.35)
Potassium: 3.4 mEq/L — ABNORMAL LOW (ref 3.5–5.1)
Sodium: 137 mEq/L (ref 135–145)
Total Bilirubin: 0.4 mg/dL (ref 0.3–1.2)

## 2012-09-14 MED ORDER — SODIUM CHLORIDE 0.9 % IV BOLUS (SEPSIS)
500.0000 mL | Freq: Once | INTRAVENOUS | Status: AC
  Administered 2012-09-14: 500 mL via INTRAVENOUS

## 2012-09-14 MED ORDER — ASPIRIN 81 MG PO CHEW
243.0000 mg | CHEWABLE_TABLET | Freq: Once | ORAL | Status: AC
  Administered 2012-09-14: 243 mg via ORAL
  Filled 2012-09-14: qty 3

## 2012-09-14 NOTE — ED Provider Notes (Signed)
CSN: [id number]     Arrival date & time 09/14/12  2151 History   First MD Initiated Contact with Patient 09/14/12 2302     Chief Complaint  Patient presents with  . Dysuria  . Fever  . Chest Pain   (Consider location/radiation/quality/duration/timing/severity/associated sxs/prior Treatment) Patient is a 62 y.o. male presenting with dysuria, fever, and chest pain. The history is provided by the patient.  Dysuria This is a new problem. The current episode started 2 days ago. The problem occurs constantly. The problem has not changed since onset.Associated symptoms include chest pain. Pertinent negatives include no abdominal pain, no headaches and no shortness of breath. Exacerbated by: urinating. Nothing relieves the symptoms. Treatments tried: azo. The treatment provided mild relief.  Fever Max temp prior to arrival:  101.5 Temp source:  Oral Onset quality:  Unable to specify Duration:  1 day Timing:  Intermittent Progression:  Waxing and waning Chronicity:  New Relieved by:  Acetaminophen Ineffective treatments:  None tried Associated symptoms: chest pain and dysuria   Associated symptoms: no confusion, no congestion, no cough, no diarrhea, no headaches, no nausea, no rash, no rhinorrhea and no vomiting   Chest pain:    Quality:  Pressure (R sided, beginning at rest)   Severity:  Mild   Onset quality:  Sudden   Duration:  1 hour   Timing:  Constant   Progression:  Resolved   Chronicity:  Recurrent Dysuria:    Severity:  Moderate   Onset quality:  Gradual   Duration:  2 days   Timing:  Intermittent   Progression:  Waxing and waning   Chronicity:  New Risk factors: recent surgery (had deep brain stimulatior battery changed 8/28 as outpt)   Risk factors: no hx of cancer, no immunosuppression, no occupational exposure, no recent travel and no sick contacts   Chest Pain Associated symptoms: fever   Associated symptoms: no abdominal pain, no back pain, no cough, no dizziness, no  dysphagia, no fatigue, no headache, no nausea, no numbness, no shortness of breath, not vomiting and no weakness     Past Medical History  Diagnosis Date  . Cardiomyopathy   . CAD (coronary artery disease)   . HTN (hypertension)     pt denies 08/19/12  . MI (myocardial infarction)   . Sleep apnea   . GERD (gastroesophageal reflux disease)   . Hyperthyroidism   . DM (diabetes mellitus)   . Hyperplasia, prostate   . Benign neoplasm of colon   . Nephrolithiasis   . Ventral hernia   . HLD (hyperlipidemia)   . Parkinson disease     1999  . OSA (obstructive sleep apnea)     AHI-28,on CPAP, noncompliant with CPAP  . Sleep apnea, organic   . Complication of anesthesia     pt states that he got a rash  . Arthritis   . Shortness of breath     Hx: of at all times   Past Surgical History  Procedure Laterality Date  . Coronary artery bypass graft  2000    Tressie Stalker, MD  . Coronary stent placement  1998  . Median sternotomy  2000  . Deep brain stimulator placement  2004    Right and left VIN stimulator placement   . Acoustic neuroma resection  1981  . Finger amputation      left pointer  . Cataract extraction w/ intraocular lens implant      Hx: of right eye  . Tonsillectomy    .  Colonoscopy w/ biopsies and polypectomy      Hx: of  . Cardiac catheterization    . Subthalamic stimulator battery replacement N/A 09/05/2012    Procedure: Deep brain stimulator battery change;  Surgeon: Maeola Harman, MD;  Location: [facility];  Service: Neurosurgery;  Laterality: N/A;  Deep brain stimulator battery change   Family History  Problem Relation Age of Onset  . Peripheral vascular disease    . Arthritis    . Diabetes    . Alcoholism Mother 80    deceased   . Aneurysm Father 46  . Autoimmune disease Brother 9    AIDS   History  Substance Use Topics  . Smoking status: Never Smoker   . Smokeless tobacco: Never Used  . Alcohol Use: Yes     Comment: occasional wine    Review  of Systems  Constitutional: Positive for fever. Negative for activity change, appetite change and fatigue.  HENT: Negative for congestion, facial swelling, rhinorrhea and trouble swallowing.   Eyes: Negative for photophobia and pain.  Respiratory: Negative for cough, chest tightness and shortness of breath.   Cardiovascular: Positive for chest pain. Negative for leg swelling.  Gastrointestinal: Negative for nausea, vomiting, abdominal pain, diarrhea and constipation.  Endocrine: Negative for polydipsia and polyuria.  Genitourinary: Positive for dysuria. Negative for urgency, decreased urine volume and difficulty urinating.  Musculoskeletal: Negative for back pain and gait problem.  Skin: Negative for color change, rash and wound.  Allergic/Immunologic: Negative for immunocompromised state.  Neurological: Negative for dizziness, facial asymmetry, speech difficulty, weakness, numbness and headaches.  Psychiatric/Behavioral: Negative for confusion, decreased concentration and agitation.    Allergies  Penicillins  Home Medications   Current Outpatient Rx  Name  Route  Sig  Dispense  Refill  . acetaminophen (TYLENOL) 500 MG tablet   Oral   Take 1,000 mg by mouth every 6 (six) hours as needed for pain.         Marland Kitchen albuterol (VENTOLIN HFA) 108 (90 BASE) MCG/ACT inhaler   Inhalation   Inhale 2 puffs into the lungs every 6 (six) hours as needed for wheezing.   1 Inhaler   1   . aspirin EC 81 MG tablet   Oral   Take 81 mg by mouth daily.         . clonazePAM (KLONOPIN) 0.5 MG tablet   Oral   Take 1 tablet (0.5 mg total) by mouth at bedtime. When using CPAP   90 tablet   1     Pharmacy Fax [phone]   . Fluticasone-Salmeterol (ADVAIR) 250-50 MCG/DOSE AEPB   Inhalation   Inhale 1 puff into the lungs 2 (two) times daily as needed (for congestion).          . furosemide (LASIX) 80 MG tablet   Oral   Take 40-80 mg by mouth 2 (two) times daily. 1 tablet (80 mg) every morning and  1/2 tablet (40 mg) at night         . glimepiride (AMARYL) 2 MG tablet   Oral   Take 1-2 mg by mouth daily before breakfast. 1 tablet (2 mg) every morning and 1/2 tablet (1 mg) at night         . ibuprofen (ADVIL,MOTRIN) 200 MG tablet   Oral   Take 800 mg by mouth daily as needed for pain.         Marland Kitchen levothyroxine (SYNTHROID, LEVOTHROID) 125 MCG tablet   Oral   Take 125 mcg by  mouth daily before breakfast.         . losartan (COZAAR) 50 MG tablet   Oral   Take 50 mg by mouth at bedtime.         . metoprolol succinate (TOPROL-XL) 50 MG 24 hr tablet   Oral   Take 50 mg by mouth at bedtime. Take with or immediately following a meal.         . Multiple Vitamin (MULTIVITAMIN WITH MINERALS) TABS tablet   Oral   Take 1 tablet by mouth daily. Antioxidant  Multi vitamin         . EXPIRED: nitroGLYCERIN (NITROSTAT) 0.4 MG SL tablet   Sublingual   Place 1 tablet (0.4 mg total) under the tongue every 5 (five) minutes as needed for chest pain.   25 tablet   12   . Polyethyl Glycol-Propyl Glycol (SYSTANE OP)   Left Eye   Place 1 drop into the left eye daily as needed (dry eye).         . potassium chloride SA (K-DUR,KLOR-CON) 20 MEQ tablet   Oral   Take 60 mEq by mouth at bedtime.         . pramipexole (MIRAPEX) 0.5 MG tablet   Oral   Take 0.5 mg by mouth 2 (two) times daily.         . rosuvastatin (CRESTOR) 20 MG tablet   Oral   Take 20 mg by mouth daily.         . sennosides-docusate sodium (SENOKOT-S) 8.6-50 MG tablet   Oral   Take 1 tablet by mouth at bedtime.         . sitaGLIPtin-metformin (JANUMET) 50-1000 MG per tablet   Oral   Take 1 tablet by mouth 2 (two) times daily with a meal.         . traMADol (ULTRAM) 50 MG tablet   Oral   Take 50 mg by mouth 2 (two) times daily as needed for pain.         Marland Kitchen travoprost, benzalkonium, (TRAVATAN) 0.004 % ophthalmic solution   Right Eye   Place 1 drop into the right eye at bedtime.           . trihexyphenidyl (ARTANE) 2 MG tablet   Oral   Take 2 mg by mouth 2 (two) times daily with a meal.         . vitamin B-12 (CYANOCOBALAMIN) 1000 MCG tablet   Oral   Take 1,000 mcg by mouth daily.         . ciprofloxacin (CIPRO) 500 MG tablet   Oral   Take 1 tablet (500 mg total) by mouth 2 (two) times daily.   20 tablet   0    BP 111/62  Pulse 80  Temp(Src) 100.2 F (37.9 C) (Oral)  Resp 36  Wt 273 lb (123.832 kg)  BMI 41.52 kg/m2  SpO2 96% Physical Exam  Constitutional: He is oriented to person, place, and time. He appears well-developed and well-nourished. No distress.  HENT:  Head: Normocephalic and atraumatic.  Mouth/Throat: No oropharyngeal exudate.  Eyes: Pupils are equal, round, and reactive to light.  Neck: Normal range of motion. Neck supple.  Cardiovascular: Regular rhythm and normal heart sounds.  Exam reveals no gallop and no friction rub.   No murmur heard. Pulmonary/Chest: Breath sounds normal. Tachypnea noted. No respiratory distress. He has no wheezes. He has no rales.  Abdominal: Soft. Bowel sounds are normal. He exhibits no distension and  no mass. There is no tenderness. There is no rebound and no guarding.  Musculoskeletal: Normal range of motion. He exhibits no edema and no tenderness.  Neurological: He is alert and oriented to person, place, and time.  Skin: Skin is warm. He is diaphoretic.  Psychiatric: He has a normal mood and affect.    ED Course  Procedures (including critical care time) Labs Review Labs Reviewed  CBC WITH DIFFERENTIAL - Abnormal; Notable for the following:    WBC 11.7 (*)    HCT 38.0 (*)    Neutrophils Relative % 78 (*)    Neutro Abs 9.1 (*)    All other components within normal limits  COMPREHENSIVE METABOLIC PANEL - Abnormal; Notable for the following:    Potassium 3.4 (*)    Glucose, Bld 185 (*)    GFR calc non Af Amer 87 (*)    All other components within normal limits  URINALYSIS, ROUTINE W REFLEX MICROSCOPIC  - Abnormal; Notable for the following:    Color, Urine ORANGE (*)    APPearance CLOUDY (*)    Hgb urine dipstick TRACE (*)    Bilirubin Urine MODERATE (*)    Ketones, ur 15 (*)    Protein, ur 30 (*)    Urobilinogen, UA 2.0 (*)    Nitrite POSITIVE (*)    Leukocytes, UA LARGE (*)    All other components within normal limits  URINE MICROSCOPIC-ADD ON - Abnormal; Notable for the following:    Squamous Epithelial / LPF FEW (*)    Bacteria, UA MANY (*)    All other components within normal limits  CULTURE, BLOOD (ROUTINE X 2)  CULTURE, BLOOD (ROUTINE X 2)  URINE CULTURE  CG4 I-STAT (LACTIC ACID)  POCT I-STAT TROPONIN I  POCT I-STAT TROPONIN I   Imaging Review Dg Chest 2 View  09/15/2012   *RADIOLOGY REPORT*  Clinical Data: Dysuria, fever, chest pain  CHEST - 2 VIEW  Comparison: A chest radiograph 01/09/2012  Findings: Sternotomy wires overlie stable cardiac silhouette. Right-sided generator pack present.  No focal consolidation.  No pleural fluid or pneumothorax. Degenerative osteophytosis of the thoracic spine.  IMPRESSION: No acute cardiopulmonary process.   Original Report Authenticated By: Genevive Bi, M.D.     Date: 09/15/2012  Rate: 93  Rhythm: normal sinus rhythm  QRS Axis: indeterminate  Intervals: normal  ST/T Wave abnormalities: nonspecific T wave changesTW flattening throughout, and TWI V4,V5, V6  Conduction Disutrbances:none  Narrative Interpretation:   Old EKG Reviewed: unchanged    MDM   1. UTI (lower urinary tract infection)    Pt is a 62 y.o. male with Pmhx as above who presents with 1 day of fever, chills, and two days of dysuria.  Pt had deep brain stimulator battery replaced on 8/28, site is well appearing with improvement of swelling per wife.  He also had 30 mins of R sided CP from 6-6:30 this evening described as his typical anginal pain.  On PE, pt tachypneic (which he states is nml), BP low 100's systolic, mildly diaphoretic, warm but afebrile here.   Denies current CP, SOB, ab pain.  W/u here shows mild leukocytosis, UTI. Cr nml, trop neg x2.  Pt given dose of IV rocephin here, he is tolerating liquids, feels improved.  I believe he is safe for outpt treatment, but has been given strict return precautions for new or worsening symptoms.  He has done well in the past with cipro, have given Rx for 10d cipro.  1. UTI (lower urinary tract infection)         Shanna Cisco, MD 09/15/12 [phone]

## 2012-09-14 NOTE — ED Notes (Signed)
Pt in stating he had surgery last Thursday and had a brain cell stimulator for his Parkinson's disease, pt also states he has had painful urination and frequency over the last two days, pt states he developed a fever this evening and is unsure which is causing it, pt took tylenol approx an hour ago, pt also adds that he had an episode of chest pain earlier that was relieved after taking nitro SL x2, denies chest pain at this time

## 2012-09-15 ENCOUNTER — Other Ambulatory Visit: Payer: Self-pay | Admitting: Cardiology

## 2012-09-15 ENCOUNTER — Other Ambulatory Visit: Payer: Self-pay | Admitting: Internal Medicine

## 2012-09-15 DIAGNOSIS — N390 Urinary tract infection, site not specified: Secondary | ICD-10-CM

## 2012-09-15 HISTORY — DX: Urinary tract infection, site not specified: N39.0

## 2012-09-15 LAB — URINE MICROSCOPIC-ADD ON

## 2012-09-15 LAB — URINALYSIS, ROUTINE W REFLEX MICROSCOPIC
Glucose, UA: NEGATIVE mg/dL
pH: 5 (ref 5.0–8.0)

## 2012-09-15 MED ORDER — CIPROFLOXACIN HCL 500 MG PO TABS: 500.0000 mg | ORAL_TABLET | Freq: Two times a day (BID) | ORAL | Status: DC

## 2012-09-15 MED ORDER — SODIUM CHLORIDE 0.9 % IV BOLUS (SEPSIS)
500.0000 mL | Freq: Once | INTRAVENOUS | Status: AC
  Administered 2012-09-15: 500 mL via INTRAVENOUS

## 2012-09-15 MED ORDER — ACETAMINOPHEN 325 MG PO TABS
650.0000 mg | ORAL_TABLET | Freq: Once | ORAL | Status: AC
  Administered 2012-09-15: 650 mg via ORAL
  Filled 2012-09-15: qty 2

## 2012-09-15 MED ORDER — DEXTROSE 5 % IV SOLN
1.0000 g | Freq: Once | INTRAVENOUS | Status: AC
  Administered 2012-09-15: 1 g via INTRAVENOUS
  Filled 2012-09-15: qty 10

## 2012-09-15 NOTE — ED Notes (Signed)
Pt returned from radiology.

## 2012-09-17 ENCOUNTER — Ambulatory Visit (INDEPENDENT_AMBULATORY_CARE_PROVIDER_SITE_OTHER): Payer: Federal, State, Local not specified - PPO | Admitting: Internal Medicine

## 2012-09-17 ENCOUNTER — Encounter: Payer: Self-pay | Admitting: Internal Medicine

## 2012-09-17 VITALS — BP 114/67 | HR 73 | Temp 97.9°F | Wt 270.0 lb

## 2012-09-17 DIAGNOSIS — N429 Disorder of prostate, unspecified: Secondary | ICD-10-CM | POA: Diagnosis not present

## 2012-09-17 DIAGNOSIS — R300 Dysuria: Secondary | ICD-10-CM | POA: Diagnosis not present

## 2012-09-17 DIAGNOSIS — G20 Parkinson's disease: Secondary | ICD-10-CM

## 2012-09-17 DIAGNOSIS — N390 Urinary tract infection, site not specified: Secondary | ICD-10-CM | POA: Diagnosis not present

## 2012-09-17 DIAGNOSIS — IMO0001 Reserved for inherently not codable concepts without codable children: Secondary | ICD-10-CM

## 2012-09-17 LAB — URINE CULTURE

## 2012-09-17 LAB — HEMOGLOBIN A1C: Hgb A1c MFr Bld: 7.8 % — ABNORMAL HIGH (ref 4.6–6.5)

## 2012-09-17 MED ORDER — PHENAZOPYRIDINE HCL 200 MG PO TABS: 200.0000 mg | ORAL_TABLET | Freq: Three times a day (TID) | ORAL | Status: DC | PRN

## 2012-09-17 NOTE — Progress Notes (Signed)
  Subjective:    Patient ID: Henry [name], male    DOB: [birth year], 62 y.o.   MRN: [id number]  HPI  He was treated in emergency room 09/15/12 for urinary incontinence with significant dysuria. Culture revealed Klebsiella uniformly sensitive except to nitrofurantoin. He has a history of rash with penicillin.  He also had flank discomfort, fever, and chills.  He was placed on Cipro as an outpatient after parenteral Rocephin.    Review of Systems  At this time he continues to have some dysuria but the incontinence has resolved. He also is not having fever, chills, sweats, or flank pain.  The urine remains orange related to OTC Azo. He states that the dysuria is 50% better.  Fasting blood sugars have been in the 150s     Objective:   Physical Exam General appearance:adequately nourished; weight excess; w/o distress. PD's mild facial sigmata  Eyes: No conjunctival inflammation or scleral icterus is present.   Heart:  Normal rate and regular rhythm. S1 and S2 normal without gallop, murmur, click, rub or other extra sounds  . Rare premature   Lungs:Chest clear to auscultation; no wheezes, rhonchi,rales ,or rubs present.No increased work of breathing.   Abdomen: bowel sounds normal, soft and non-tender without masses, organomegaly or hernias noted.  No guarding or rebound   Skin:Warm & dry.  Intact without suspicious lesions or rashes   Lymphatic: No lymphadenopathy is noted about the head, neck, axilla, or inguinal areas.   Variceal on the left. Left prostate lobe minimally enlarged with some induration. No nodules palpable            Assessment & Plan:  #1 Klebsiella urinary tract infection; he needs to complete the full course of Cipro. Prostatism does not appear to be a significant component in the urinary tract infection.  #2 some persistent dysuria. Pyridium will be prescribed in place of the Azo  #3 diabetes; nutritional interventions discussed

## 2012-09-17 NOTE — Patient Instructions (Addendum)
Drink as much nondairy fluids as possible. Avoid spicy foods or alcohol as  these may aggravate the bladder/prostate. Do not take decongestants. Avoid narcotics if possible.  If you activate the  My Chart system; lab & Xray results will be released directly  to you as soon as I review & address these through the computer. If you choose not to sign up for My Chart within 36 hours of labs being drawn; results will be reviewed & interpretation added before being copied & mailed, causing a delay in getting the results to you.If you do not receive that report within 7-10 days ,please call. Additionally you can use this system to gain direct  access to your records  if  out of town or @ an office of a  physician who is not in  the My Chart network.  This improves continuity of care & places you in control of your medical record.

## 2012-09-21 ENCOUNTER — Other Ambulatory Visit: Payer: Self-pay | Admitting: Internal Medicine

## 2012-09-21 LAB — CULTURE, BLOOD (ROUTINE X 2)
Culture: NO GROWTH
Culture: NO GROWTH

## 2012-09-23 NOTE — Telephone Encounter (Signed)
Med filled.  

## 2012-09-26 ENCOUNTER — Encounter: Payer: Self-pay | Admitting: Internal Medicine

## 2012-09-26 DIAGNOSIS — R300 Dysuria: Secondary | ICD-10-CM

## 2012-09-26 DIAGNOSIS — R34 Anuria and oliguria: Secondary | ICD-10-CM

## 2012-09-30 ENCOUNTER — Other Ambulatory Visit: Payer: Self-pay | Admitting: Internal Medicine

## 2012-09-30 NOTE — Telephone Encounter (Signed)
OK , R X1 

## 2012-09-30 NOTE — Telephone Encounter (Signed)
Tramadol HCL 50 mg Last OV 09-17-12 Med is listed as Historical   Controlled substance contract on file  NEEDS UDS

## 2012-10-02 ENCOUNTER — Telehealth: Payer: Self-pay | Admitting: *Deleted

## 2012-10-02 ENCOUNTER — Ambulatory Visit (INDEPENDENT_AMBULATORY_CARE_PROVIDER_SITE_OTHER): Payer: Federal, State, Local not specified - PPO | Admitting: Neurology

## 2012-10-02 ENCOUNTER — Encounter: Payer: Self-pay | Admitting: Neurology

## 2012-10-02 VITALS — BP 124/60 | HR 60 | Temp 98.4°F | Resp 24 | Wt 273.9 lb

## 2012-10-02 DIAGNOSIS — G20 Parkinson's disease: Secondary | ICD-10-CM

## 2012-10-02 DIAGNOSIS — R81 Glycosuria: Secondary | ICD-10-CM | POA: Diagnosis not present

## 2012-10-02 DIAGNOSIS — R350 Frequency of micturition: Secondary | ICD-10-CM | POA: Diagnosis not present

## 2012-10-02 MED ORDER — TRAMADOL HCL 50 MG PO TABS: 50.0000 mg | ORAL_TABLET | Freq: Two times a day (BID) | ORAL | Status: DC | PRN

## 2012-10-02 NOTE — Progress Notes (Signed)
Henry [name] was seen today in the movement disorders clinic for neurologic consultation at the request of Kerri Perches.  [name], MD.  The consultation is for the evaluation of PD and to manage his DBS.  He is accompanied by his wife who supplements the history.  The first symptom(s) the patient noticed was right hand tremor in 1999.  He was seen by neurology and was dx with PD.  He was placed on something, but it caused sleepiness.  He does not think that he has ever been on levodopa.   He was placed on Mirapex, which seemed to help.  He only takes it twice per day.  He didn't think it made a difference when he took it tid.  He began to have tremor and it was suggested he do DBS.    The pt is s/p stn DBS in 2005.  He had a battery change in 2009.    10/02/12 update: The patient is accompanied today by his daughter, who supplements the history.  I reviewed medical records available to me since last visit.  The patient had his generator changed on 09/05/2012.  He remains on pramipexole, 0.5 mg twice per day.  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 Vickey Huger in that regard.  He is not compliant with CPAP so says that he doesn't want to return to her for f/u. He doesn't take the klonopin faithfully. Wearing off:  no  How long before next dose:  n/a Falls:   no N/V:  no Hallucinations:  no  visual distortions: no Lightheaded:  no  Syncope: no Dyskinesia:  no   Neuroimaging has  previously been performed.  It is available for my review today.  This was done in February, 2005 in preparation for DBS.  The examination was essentially unremarkable.  The formal report is below:  Clinical Data: Parkinson's disease.  MRI BRAIN WITHOUT AND WITH CONTRAST  Technique: Stealth protocol.  Multiplanar T1- weighted images were obtained before and after the administration of 20 cc of Omniscan. 5.0, 3.0, and ultimately 1.3 mm thick images were obtained through the  brain with special attention to the basal ganglia and deep commissures to allow for stereotactic planning and treatment of Parkinson's disease. There are no discrete or focal lesions demonstrated on these limited pulse sequences, which do not include a diffusion or T2- weighted pulse sequence. FLAIR images were obtained and show no significant small vessel disease or ventriculomegaly.  IMPRESSION  MRI of the brain without and with contrast performed according to Stealth protocol showing no obvious focal or enhancing lesion.   PREVIOUS MEDICATIONS: Mirapex and artane  ALLERGIES:   Allergies  Allergen Reactions  . Penicillins Rash    Because of a history of documented adverse serious drug reaction;Medi Alert bracelet  is recommended    CURRENT MEDICATIONS:     Medication List       This list is accurate as of: 10/02/12 10:49 AM.  Always use your most recent med list.               albuterol 108 (90 BASE) MCG/ACT inhaler  Commonly known as:  VENTOLIN HFA  Inhale 2 puffs into the lungs every 6 (six) hours as needed for wheezing.     aspirin EC 81 MG tablet  Take 81 mg by mouth daily.     clonazePAM 0.5 MG tablet  Commonly known as:  KLONOPIN  Take 1  tablet (0.5 mg total) by mouth at bedtime. When using CPAP     Fluticasone-Salmeterol 250-50 MCG/DOSE Aepb  Commonly known as:  ADVAIR  Inhale 1 puff into the lungs 2 (two) times daily as needed (for congestion).     furosemide 80 MG tablet  Commonly known as:  LASIX  TAKE 1 TABLET IN THE MORNING AND 1/2 TABLET IN THE EVENING     glimepiride 2 MG tablet  Commonly known as:  AMARYL  TAKE 1 TABLET BY MOUTH IN THE MORNING AND 1/2 TABLET AT NIGHT     ibuprofen 200 MG tablet  Commonly known as:  ADVIL,MOTRIN  Take 800 mg by mouth daily as needed for pain.     levothyroxine 125 MCG tablet  Commonly known as:  SYNTHROID, LEVOTHROID  Take 125 mcg by mouth daily before breakfast.     losartan 50 MG tablet  Commonly known as:   COZAAR  Take 50 mg by mouth at bedtime.     metoprolol succinate 50 MG 24 hr tablet  Commonly known as:  TOPROL-XL  Take 50 mg by mouth at bedtime. Take with or immediately following a meal.     multivitamin with minerals Tabs tablet  Take 1 tablet by mouth daily. Antioxidant  Multi vitamin     nitroGLYCERIN 0.4 MG SL tablet  Commonly known as:  NITROSTAT  Place 1 tablet (0.4 mg total) under the tongue every 5 (five) minutes as needed for chest pain.     phenazopyridine 200 MG tablet  Commonly known as:  PYRIDIUM  Take 1 tablet (200 mg total) by mouth 3 (three) times daily as needed for pain.     potassium chloride SA 20 MEQ tablet  Commonly known as:  K-DUR,KLOR-CON  Take 60 mEq by mouth at bedtime.     pramipexole 0.5 MG tablet  Commonly known as:  MIRAPEX  Take 0.5 mg by mouth 2 (two) times daily.     rosuvastatin 20 MG tablet  Commonly known as:  CRESTOR  Take 20 mg by mouth daily.     sennosides-docusate sodium 8.6-50 MG tablet  Commonly known as:  SENOKOT-S  Take 1 tablet by mouth at bedtime.     sitaGLIPtin-metformin 50-1000 MG per tablet  Commonly known as:  JANUMET  Take 1 tablet by mouth 2 (two) times daily with a meal.     traMADol 50 MG tablet  Commonly known as:  ULTRAM  Take 50 mg by mouth 2 (two) times daily as needed for pain.     travoprost (benzalkonium) 0.004 % ophthalmic solution  Commonly known as:  TRAVATAN  Place 1 drop into the right eye at bedtime.     trihexyphenidyl 2 MG tablet  Commonly known as:  ARTANE  Take 2 mg by mouth 2 (two) times daily with a meal.     vitamin B-12 1000 MCG tablet  Commonly known as:  CYANOCOBALAMIN  Take 1,000 mcg by mouth daily.         PAST MEDICAL HISTORY:   Past Medical History  Diagnosis Date  . Cardiomyopathy   . CAD (coronary artery disease)   . HTN (hypertension)     pt denies 08/19/12  . MI (myocardial infarction)   . Sleep apnea   . GERD (gastroesophageal reflux disease)   .  Hyperthyroidism   . DM (diabetes mellitus)   . Hyperplasia, prostate   . Benign neoplasm of colon   . Nephrolithiasis   . Ventral hernia   .  HLD (hyperlipidemia)   . Parkinson disease     1999  . OSA (obstructive sleep apnea)     AHI-28,on CPAP, noncompliant with CPAP  . Sleep apnea, organic   . Complication of anesthesia     pt states that he got a rash  . Arthritis   . Shortness of breath     Hx: of at all times  . UTI (lower urinary tract infection) 09/15/12    Klebsiella    PAST SURGICAL HISTORY:   Past Surgical History  Procedure Laterality Date  . Coronary artery bypass graft  2000    Tressie Stalker, MD  . Coronary stent placement  1998  . Median sternotomy  2000  . Deep brain stimulator placement  2004    Right and left VIN stimulator placement   . Acoustic neuroma resection  1981  . Finger amputation      left pointer  . Cataract extraction w/ intraocular lens implant      Hx: of right eye  . Tonsillectomy    . Colonoscopy w/ biopsies and polypectomy      Hx: of  . Cardiac catheterization    . Subthalamic stimulator battery replacement N/A 09/05/2012    Procedure: Deep brain stimulator battery change;  Surgeon: Maeola Harman, MD;  Location: [facility];  Service: Neurosurgery;  Laterality: N/A;  Deep brain stimulator battery change    SOCIAL HISTORY:   History   Social History  . Marital Status: Married    Spouse Name: [name]    Number of Children: 2  . Years of Education: N/A   Occupational History  . 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 Smoking status: Never Smoker   . Smokeless tobacco: Never Used  . Alcohol Use: Yes     Comment: occasional wine  . Drug Use: No  . Sexual Activity: Not on file   Other Topics Concern  . Not on file   Social History Narrative  . No narrative on file    FAMILY HISTORY:   Family Status  Relation Status Death Age  . Mother Deceased     complications of surgery  . Father  Deceased     EtOHism  . Sister Alive     2 full, one half; PVD  . Brother Deceased     AIDS  . Child Alive     3, alive and well    ROS:  A complete 10 system review of systems was obtained and was unremarkable apart from what is mentioned above.  PHYSICAL EXAMINATION:    VITALS:   Filed Vitals:   10/02/12 1035  BP: 124/60  Pulse: 60  Temp: 98.4 F (36.9 C)  Resp: 24  Weight: 273 lb 14.4 oz (124.24 kg)    GEN:  The patient appears stated age and is in NAD. HEENT:  Normocephalic, atraumatic.  The mucous membranes are moist. The superficial temporal arteries are without ropiness or tenderness. CV:  RRR Lungs:  CTAB Neck/HEME:  There are no carotid bruits bilaterally.  Neurological examination:  Orientation: The patient is alert and oriented x3. Fund of knowledge is appropriate.  Recent and remote memory are intact.  Attention and concentration are normal.    Able to name objects and repeat phrases. Cranial nerves: There is good facial symmetry. Pupils are equal round and reactive to light bilaterally. Fundoscopic exam reveals clear margins bilaterally. Extraocular muscles are intact. The visual fields are full  to confrontational testing. The speech is fluent and just mildly dysarthric.  He is hypophonic. Soft palate rises symmetrically and there is no tongue deviation. Hearing is intact to conversational tone. Sensation: Sensation is intact to light and pinprick throughout (facial, trunk, extremities). Vibration is intact at the bilateral big toe. There is no extinction with double simultaneous stimulation. There is no sensory dermatomal level identified. Motor: Strength is 5/5 in the bilateral upper and lower extremities.   Shoulder shrug is equal and symmetric.  There is no pronator drift. Deep tendon reflexes: Deep tendon reflexes are 0-1/4 at the bilateral biceps, triceps, brachioradialis, patella and achilles. Plantar responses are downgoing bilaterally.  Movement  examination: Tone: There is mild increased tone in the left upper extremity that becomes moderate with activation procedures.  Tone in the right upper extremity is normal.   Abnormal movements: There is an intermittent mild resting tremor of the left upper and left lower extremity.  There is a more rare tremor of the RUE.  With the device off, there is a mild/mod resting tremor bilaterally. Coordination:  There is mild decremation with RAM's, seen most prominently with heel/toe taps bilaterally. Gait and Station: The patient has minimal difficulty arising out of a deep-seated chair without the use of the hands. The patient's stride length is decreased, somewhat shuffling.  The patient has a neg pull test.      LABS  Lab Results  Component Value Date   WBC 11.7* 09/14/2012   HGB 13.2 09/14/2012   HCT 38.0* 09/14/2012   MCV 82.8 09/14/2012   PLT 151 09/14/2012   Lab Results  Component Value Date   TSH 0.36 02/20/2012     Chemistry      Component Value Date/Time   NA 137 09/14/2012 2252   K 3.4* 09/14/2012 2252   CL 99 09/14/2012 2252   CO2 27 09/14/2012 2252   BUN 15 09/14/2012 2252   CREATININE 0.98 09/14/2012 2252      Component Value Date/Time   CALCIUM 9.6 09/14/2012 2252   ALKPHOS 73 09/14/2012 2252   AST 12 09/14/2012 2252   ALT 17 09/14/2012 2252   BILITOT 0.4 09/14/2012 2252     Lab Results  Component Value Date   VITAMINB12 268 08/19/2012   Lab Results  Component Value Date   HGBA1C 7.8* 09/17/2012   DBS programming was performed today, which is described in more detail on a separate programming procedural notes.  In short, there was improvement in tremor and rigidity on the left.  ASSESSMENT/PLAN:  1.  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    -I am going to decrease the artane to 1 pill daily for a week and then d/c it.  I think it is contributing to cognitive changes.  -As above, settings were changed today.  -He does not wish to go to the neuro rehabilitation  center currently for the Parkinson's program  -Pt was shown how to use pt programmer today. 2.  RBD  -I encouraged the patient to use his clonazepam, 0.5 mg, half to one tablet at night.  He has very significant RBD, and even rolled out of bed once and hit his head, which required stitches.  He has very vivid dreams that he acts out on at least 3 times per week. 3.  OSAS, noncompliant with CPAP.  -He follows with Dr. Vickey Huger.  He understands morbidity and mortality associated with untreated sleep apnea.  Weight loss would be of value  4.  Sialorrhea  -This is associated with Parkinson's disease.  He may benefit from Myobloc in the future. 5.  Dysphagia.  -Overall, this is fairly mild but I will give an eye on this and consider a MBE if needed. 6.  I will see him back in the next 12 weeks, sooner should new neurologic issues arise.

## 2012-10-02 NOTE — Procedures (Signed)
DBS Programming was performed.    Total time spent programming was 60 minutes.  Device was confirmed to be on.  Soft start was confirmed to be on.  Impedences were checked and were within normal limits.  Battery was checked and was determined to be functioning normally and not near the end of life.  Final settings were as follows:  Left brain electrode:     1-2+           ; Amplitude  3.9   V   ; Pulse width 90 microseconds;   Frequency  130    Hz.  Right brain electrode:    4-C+          ; Amplitude   3.2  V ;  Pulse width 60  microseconds;  Frequency   130    Hz.

## 2012-10-02 NOTE — Telephone Encounter (Signed)
Called and spoke with patient to inform him that his prescription for Tramadol was ready for pick up and that a UDS sample would be required. He stated his understanding.

## 2012-10-02 NOTE — Telephone Encounter (Signed)
Med filled.  

## 2012-10-02 NOTE — Telephone Encounter (Signed)
Tramadol denied filled on 9/22. Diabetic med filled.

## 2012-10-03 ENCOUNTER — Ambulatory Visit: Payer: Federal, State, Local not specified - PPO | Admitting: Neurology

## 2012-10-11 ENCOUNTER — Telehealth: Payer: Self-pay | Admitting: Neurology

## 2012-10-11 ENCOUNTER — Ambulatory Visit (INDEPENDENT_AMBULATORY_CARE_PROVIDER_SITE_OTHER): Payer: Federal, State, Local not specified - PPO | Admitting: Neurology

## 2012-10-11 VITALS — BP 110/60 | HR 80 | Temp 98.0°F | Resp 22 | Wt 266.0 lb

## 2012-10-11 DIAGNOSIS — G20 Parkinson's disease: Secondary | ICD-10-CM | POA: Diagnosis not present

## 2012-10-11 NOTE — Procedures (Signed)
DBS Programming was performed.    Total time spent programming was 45 minutes.  Device was confirmed to be on.  Soft start was confirmed to be on.  Impedences were checked and were within normal limits.  Battery was checked and was determined to be functioning normally and not near the end of life.  Final settings were as follows:  Left brain electrode:     1-2+           ; Amplitude  4.0   V   ; Pulse width 90 microseconds;   Frequency  130    Hz.  Right brain electrode:     4-7+          ; Amplitude   3.8  V ;  Pulse width 90  microseconds;  Frequency   160    Hz.

## 2012-10-11 NOTE — Telephone Encounter (Signed)
Maralyn Sago, pt called in and spoke with Jan just now (it is 2:30 pm, Friday) and wanted to come in for me to adjust his DBS.  I have several more pts to go today but am off next week.  Please find out what problem he is having.  Not sure its possible to be seen today.

## 2012-10-11 NOTE — Telephone Encounter (Signed)
Called pt and talked to his wife, she said 3 days ago his arms started rolling continuously. Pt is not getting any sleep and his muscles are very tired/sore from all the movement. Pt is coming in today for Dr.Tat to adjust DBS settings.

## 2012-10-11 NOTE — Progress Notes (Signed)
Henry [name] was seen today in the movement disorders clinic for neurologic consultation at the request of Henry [name].  His PCP is Henry Melnick, MD.  The consultation is for the evaluation of PD and to manage his DBS.  He is accompanied by his wife who supplements the history.  The first symptom(s) the patient noticed was right hand tremor in 1999.  He was seen by neurology and was dx with PD.  He was placed on something, but it caused sleepiness.  He does not think that he has ever been on levodopa.   He was placed on Mirapex, which seemed to help.  He only takes it twice per day.  He didn't think it made a difference when he took it tid.  He began to have tremor and it was suggested he do DBS.    The pt is s/p stn DBS in 2005.  He had a battery change in 2009.    10/02/12 update: The patient is accompanied today by his daughter, who supplements the history.  I reviewed medical records available to me since last visit.  The patient had his generator changed on 09/05/2012.  He remains on pramipexole, 0.5 mg twice per day.  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 Vickey [name] in that regard.  He is not compliant with CPAP so says that he doesn't want to return to her for f/u. He doesn't take the klonopin faithfully.  10/11/12 update:  Pt was seen as a walk in/work in today.  Pt had increasing tremor, L greater than R for 3 days.  Thinks that it started with the d/c of artane.  Speech stable.  Wearing off:  no  How long before next dose:  n/a Falls:   no N/V:  no Hallucinations:  no  visual distortions: no Lightheaded:  no  Syncope: no Dyskinesia:  no   Neuroimaging has  previously been performed.  It is available for my review today.  This was done in February, 2005 in preparation for DBS.  The examination was essentially unremarkable.  The formal report is below:  Clinical Data: Parkinson's disease.  MRI BRAIN WITHOUT AND WITH CONTRAST  Technique: Stealth  protocol.  Multiplanar T1- weighted images were obtained before and after the administration of 20 cc of Omniscan. 5.0, 3.0, and ultimately 1.3 mm thick images were obtained through the brain with special attention to the basal ganglia and deep commissures to allow for stereotactic planning and treatment of Parkinson's disease. There are no discrete or focal lesions demonstrated on these limited pulse sequences, which do not include a diffusion or T2- weighted pulse sequence. FLAIR images were obtained and show no significant small vessel disease or ventriculomegaly.  IMPRESSION  MRI of the brain without and with contrast performed according to Stealth protocol showing no obvious focal or enhancing lesion.   PREVIOUS MEDICATIONS: Mirapex and artane  ALLERGIES:   Allergies  Allergen Reactions  . Penicillins Rash    Because of a history of documented adverse serious drug reaction;Medi Alert bracelet  is recommended    CURRENT MEDICATIONS:     Medication List       This list is accurate as of: 10/11/12  3:42 PM.  Always use your most recent med list.               albuterol 108 (90 BASE) MCG/ACT inhaler  Commonly known as:  VENTOLIN HFA  Inhale 2 puffs into the lungs every  6 (six) hours as needed for wheezing.     aspirin EC 81 MG tablet  Take 81 mg by mouth daily.     clonazePAM 0.5 MG tablet  Commonly known as:  KLONOPIN  Take 1 tablet (0.5 mg total) by mouth at bedtime. When using CPAP     Fluticasone-Salmeterol 250-50 MCG/DOSE Aepb  Commonly known as:  ADVAIR  Inhale 1 puff into the lungs 2 (two) times daily as needed (for congestion).     furosemide 80 MG tablet  Commonly known as:  LASIX  TAKE 1 TABLET IN THE MORNING AND 1/2 TABLET IN THE EVENING     glimepiride 2 MG tablet  Commonly known as:  AMARYL  TAKE 1 TABLET BY MOUTH IN THE MORNING AND 1/2 TABLET AT NIGHT     ibuprofen 200 MG tablet  Commonly known as:  ADVIL,MOTRIN  Take 800 mg by mouth daily as needed  for pain.     levothyroxine 125 MCG tablet  Commonly known as:  SYNTHROID, LEVOTHROID  Take 125 mcg by mouth daily before breakfast.     losartan 50 MG tablet  Commonly known as:  COZAAR  Take 50 mg by mouth at bedtime.     metoprolol succinate 50 MG 24 hr tablet  Commonly known as:  TOPROL-XL  Take 50 mg by mouth at bedtime. Take with or immediately following a meal.     multivitamin with minerals Tabs tablet  Take 1 tablet by mouth daily. Antioxidant  Multi vitamin     nitroGLYCERIN 0.4 MG SL tablet  Commonly known as:  NITROSTAT  Place 1 tablet (0.4 mg total) under the tongue every 5 (five) minutes as needed for chest pain.     phenazopyridine 200 MG tablet  Commonly known as:  PYRIDIUM  Take 1 tablet (200 mg total) by mouth 3 (three) times daily as needed for pain.     potassium chloride SA 20 MEQ tablet  Commonly known as:  K-DUR,KLOR-CON  Take 60 mEq by mouth at bedtime.     pramipexole 0.5 MG tablet  Commonly known as:  MIRAPEX  Take 0.5 mg by mouth 2 (two) times daily.     rosuvastatin 20 MG tablet  Commonly known as:  CRESTOR  Take 20 mg by mouth daily.     sennosides-docusate sodium 8.6-50 MG tablet  Commonly known as:  SENOKOT-S  Take 1 tablet by mouth at bedtime.     JANUMET 50-1000 MG per tablet  Generic drug:  sitaGLIPtin-metformin  TAKE 1 TABLET BY MOUTH TWICE A DAY WITH 2 LARGEST MEALS     sitaGLIPtin-metformin 50-1000 MG per tablet  Commonly known as:  JANUMET  Take 1 tablet by mouth 2 (two) times daily with a meal.     traMADol 50 MG tablet  Commonly known as:  ULTRAM  Take 1 tablet (50 mg total) by mouth 2 (two) times daily as needed for pain.     travoprost (benzalkonium) 0.004 % ophthalmic solution  Commonly known as:  TRAVATAN  Place 1 drop into the right eye at bedtime.     trihexyphenidyl 2 MG tablet  Commonly known as:  ARTANE  Take 2 mg by mouth 2 (two) times daily with a meal.     vitamin B-12 1000 MCG tablet  Commonly known as:   CYANOCOBALAMIN  Take 1,000 mcg by mouth daily.         PAST MEDICAL HISTORY:   Past Medical History  Diagnosis Date  . Cardiomyopathy   .  CAD (coronary artery disease)   . HTN (hypertension)     pt denies 08/19/12  . MI (myocardial infarction)   . Sleep apnea   . GERD (gastroesophageal reflux disease)   . Hyperthyroidism   . DM (diabetes mellitus)   . Hyperplasia, prostate   . Benign neoplasm of colon   . Nephrolithiasis   . Ventral hernia   . HLD (hyperlipidemia)   . Parkinson disease     1999  . OSA (obstructive sleep apnea)     AHI-28,on CPAP, noncompliant with CPAP  . Sleep apnea, organic   . Complication of anesthesia     pt states that he got a rash  . Arthritis   . Shortness of breath     Hx: of at all times  . UTI (lower urinary tract infection) 09/15/12    Klebsiella    PAST SURGICAL HISTORY:   Past Surgical History  Procedure Laterality Date  . Coronary artery bypass graft  2000    Tressie Stalker, MD  . Coronary stent placement  1998  . Median sternotomy  2000  . Deep brain stimulator placement  2004    Right and left VIN stimulator placement   . Acoustic neuroma resection  1981  . Finger amputation      left pointer  . Cataract extraction w/ intraocular lens implant      Hx: of right eye  . Tonsillectomy    . Colonoscopy w/ biopsies and polypectomy      Hx: of  . Cardiac catheterization    . Subthalamic stimulator battery replacement N/A 09/05/2012    Procedure: Deep brain stimulator battery change;  Surgeon: Maeola Harman, MD;  Location: [facility];  Service: Neurosurgery;  Laterality: N/A;  Deep brain stimulator battery change    SOCIAL HISTORY:   History   Social History  . Marital Status: Married    Spouse Name: [name]    Number of Children: 2  . Years of Education: N/A   Occupational History  . 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 Smoking status: Never Smoker   . Smokeless tobacco: Never Used  .  Alcohol Use: Yes     Comment: occasional wine  . Drug Use: No  . Sexual Activity: Not on file   Other Topics Concern  . Not on file   Social History Narrative  . No narrative on file    FAMILY HISTORY:   Family Status  Relation Status Death Age  . Mother Deceased     complications of surgery  . Father Deceased     EtOHism  . Sister Alive     2 full, one half; PVD  . Brother Deceased     AIDS  . Child Alive     3, alive and well    ROS:  A complete 10 system review of systems was obtained and was unremarkable apart from what is mentioned above.  PHYSICAL EXAMINATION:    VITALS:   There were no vitals filed for this visit.  GEN:  The patient appears stated age and is in NAD. HEENT:  Normocephalic, atraumatic.  The mucous membranes are moist. The superficial temporal arteries are without ropiness or tenderness. CV:  RRR Lungs:  CTAB Neck/HEME:  There are no carotid bruits bilaterally.  Neurological examination:  Orientation: The patient is alert and oriented x3. Fund of knowledge is appropriate.  Recent and remote memory are intact.  Attention and  concentration are normal.    Able to name objects and repeat phrases. Cranial nerves: There is good facial symmetry. Pupils are equal round and reactive to light bilaterally. Fundoscopic exam reveals clear margins bilaterally. Extraocular muscles are intact. The visual fields are full to confrontational testing. The speech is fluent and just mildly dysarthric.  He is hypophonic. Soft palate rises symmetrically and there is no tongue deviation. Hearing is intact to conversational tone. Sensation: Sensation is intact to light and pinprick throughout (facial, trunk, extremities). Vibration is intact at the bilateral big toe. There is no extinction with double simultaneous stimulation. There is no sensory dermatomal level identified. Motor: Strength is 5/5 in the bilateral upper and lower extremities.   Shoulder shrug is equal and  symmetric.  There is no pronator drift. Deep tendon reflexes: Deep tendon reflexes are 0-1/4 at the bilateral biceps, triceps, brachioradialis, patella and achilles. Plantar responses are downgoing bilaterally.  Movement examination: Tone: There is mild increased tone in the left upper extremity.  Tone in the right upper extremity is normal.   Abnormal movements: There is a moderate resting tremor of the left upper and left lower extremity.  There is a mild  tremor of the RUE.  After reprogramming, tremor was improved b/l   Coordination:  There is mild decremation with RAM's, seen most prominently with heel/toe taps bilaterally. Gait and Station: The patient has minimal difficulty arising out of a deep-seated chair without the use of the hands. The patient's stride length is decreased, somewhat shuffling and festinating.  The patient has a neg pull test.      LABS  Lab Results  Component Value Date   WBC 11.7* 09/14/2012   HGB 13.2 09/14/2012   HCT 38.0* 09/14/2012   MCV 82.8 09/14/2012   PLT 151 09/14/2012   Lab Results  Component Value Date   TSH 0.36 02/20/2012     Chemistry      Component Value Date/Time   NA 137 09/14/2012 2252   K 3.4* 09/14/2012 2252   CL 99 09/14/2012 2252   CO2 27 09/14/2012 2252   BUN 15 09/14/2012 2252   CREATININE 0.98 09/14/2012 2252      Component Value Date/Time   CALCIUM 9.6 09/14/2012 2252   ALKPHOS 73 09/14/2012 2252   AST 12 09/14/2012 2252   ALT 17 09/14/2012 2252   BILITOT 0.4 09/14/2012 2252     Lab Results  Component Value Date   VITAMINB12 268 08/19/2012   Lab Results  Component Value Date   HGBA1C 7.8* 09/17/2012   DBS programming was performed today, which is described in more detail on a separate programming procedural notes.  In short, there was improvement in tremor b/l following programming.  ASSESSMENT/PLAN:  1.  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    -I am going to continue off artane but if he needs it, we may start it  again in the future.  I'm trying to avoid it b/c of cognitive dulling.  -As above, settings were changed today.  -He does not wish to go to the neuro rehabilitation center currently for the Parkinson's program  -Pt was asked to bring in the pt controller next visit. 2.  RBD  -I encouraged the patient to use his clonazepam, 0.5 mg, half to one tablet at night.  He has very significant RBD, and even rolled out of bed once and hit his head, which required stitches.  He has very vivid dreams that he  acts out on at least 3 times per week. 3.  OSAS, noncompliant with CPAP.  -He follows with Dr. Vickey [name].  He understands morbidity and mortality associated with untreated sleep apnea.  Weight loss would be of value 4.  Sialorrhea  -This is associated with Parkinson's disease.  He may benefit from Myobloc in the future. 5.  Dysphagia.  -Overall, this is fairly mild but I will give an eye on this and consider a MBE if needed. 6.  I will see him back at his previously scheduled appt.

## 2012-10-17 ENCOUNTER — Ambulatory Visit: Payer: Medicare Other | Admitting: Nurse Practitioner

## 2012-10-22 ENCOUNTER — Ambulatory Visit: Payer: Federal, State, Local not specified - PPO | Admitting: Neurology

## 2012-10-30 DIAGNOSIS — R81 Glycosuria: Secondary | ICD-10-CM | POA: Diagnosis not present

## 2012-10-30 DIAGNOSIS — R350 Frequency of micturition: Secondary | ICD-10-CM | POA: Diagnosis not present

## 2012-11-03 ENCOUNTER — Other Ambulatory Visit: Payer: Self-pay | Admitting: Internal Medicine

## 2012-11-04 ENCOUNTER — Telehealth: Payer: Self-pay | Admitting: Internal Medicine

## 2012-11-04 NOTE — Telephone Encounter (Signed)
Patient would like to know if we received request for diabetic supplies from [facility].  CB on mobile #

## 2012-11-05 ENCOUNTER — Telehealth: Payer: Self-pay | Admitting: *Deleted

## 2012-11-05 NOTE — Telephone Encounter (Signed)
OK X1 

## 2012-11-05 NOTE — Telephone Encounter (Signed)
traMADol (ULTRAM) 50 MG tablet Last OV: 09/17/12 Last refill: 09/30/2012 No UDS on file

## 2012-11-07 ENCOUNTER — Other Ambulatory Visit: Payer: Self-pay | Admitting: *Deleted

## 2012-11-07 MED ORDER — TRAMADOL HCL 50 MG PO TABS: 50.0000 mg | ORAL_TABLET | Freq: Two times a day (BID) | ORAL | Status: DC | PRN

## 2012-11-07 NOTE — Telephone Encounter (Signed)
Medication refilled

## 2012-11-07 NOTE — Telephone Encounter (Signed)
Tramadol refilled.

## 2012-11-14 ENCOUNTER — Other Ambulatory Visit: Payer: Self-pay

## 2012-11-17 ENCOUNTER — Other Ambulatory Visit: Payer: Self-pay | Admitting: Internal Medicine

## 2012-11-18 ENCOUNTER — Ambulatory Visit: Payer: Federal, State, Local not specified - PPO | Admitting: Neurology

## 2012-11-18 DIAGNOSIS — H409 Unspecified glaucoma: Secondary | ICD-10-CM | POA: Diagnosis not present

## 2012-11-18 DIAGNOSIS — E119 Type 2 diabetes mellitus without complications: Secondary | ICD-10-CM | POA: Diagnosis not present

## 2012-11-18 DIAGNOSIS — H35349 Macular cyst, hole, or pseudohole, unspecified eye: Secondary | ICD-10-CM | POA: Diagnosis not present

## 2012-11-18 DIAGNOSIS — H2510 Age-related nuclear cataract, unspecified eye: Secondary | ICD-10-CM | POA: Diagnosis not present

## 2012-11-18 DIAGNOSIS — H40039 Anatomical narrow angle, unspecified eye: Secondary | ICD-10-CM | POA: Diagnosis not present

## 2012-11-18 DIAGNOSIS — H4030X0 Glaucoma secondary to eye trauma, unspecified eye, stage unspecified: Secondary | ICD-10-CM | POA: Diagnosis not present

## 2012-11-18 NOTE — Telephone Encounter (Signed)
Klor-Con refill sent to pharmacy 

## 2012-11-19 ENCOUNTER — Ambulatory Visit (INDEPENDENT_AMBULATORY_CARE_PROVIDER_SITE_OTHER): Payer: Federal, State, Local not specified - PPO | Admitting: Neurology

## 2012-11-19 ENCOUNTER — Encounter: Payer: Self-pay | Admitting: Neurology

## 2012-11-19 VITALS — BP 112/70 | HR 60 | Temp 98.0°F | Resp 18 | Ht 69.0 in | Wt 268.4 lb

## 2012-11-19 DIAGNOSIS — G20 Parkinson's disease: Secondary | ICD-10-CM | POA: Diagnosis not present

## 2012-11-19 DIAGNOSIS — G20A1 Parkinson's disease without dyskinesia, without mention of fluctuations: Secondary | ICD-10-CM

## 2012-11-19 NOTE — Progress Notes (Signed)
Henry [name] was seen today in the movement disorders clinic for neurologic consultation at the request of Henry [name].  His PCP is Henry Melnick, MD.  The consultation is for the evaluation of PD and to manage his DBS.  He is accompanied by his wife who supplements the history.  The first symptom(s) the patient noticed was right hand tremor in 1999.  He was seen by neurology and was dx with PD.  He was placed on something, but it caused sleepiness.  He does not think that he has ever been on levodopa.   He was placed on Mirapex, which seemed to help.  He only takes it twice per day.  He didn't think it made a difference when he took it tid.  He began to have tremor and it was suggested he do DBS.    The pt is s/p stn DBS in 2005.  He had a battery change in 2009.    10/02/12 update: The patient is accompanied today by his daughter, who supplements the history.  I reviewed medical records available to me since last visit.  The patient had his generator changed on 09/05/2012.  He remains on pramipexole, 0.5 mg twice per day.  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 Vickey [name] in that regard.  He is not compliant with CPAP so says that he doesn't want to return to her for f/u. He doesn't take the klonopin faithfully.  10/11/12 update:  Pt was seen as a walk in/work in today.  Pt had increasing tremor, L greater than R for 3 days.  Thinks that it started with the d/c of artane.  Speech stable.  11/19/12 update:  Pt is seen today for his PD, accompanied by his daughter who supplements the hx.    He is currently on klonopin 0.5 mg - 1/2-1 tablet q hs.  He only takes it when his wife is not working.  She works nights and is only in 2 days per week.  He has some reluctance to take it other nights.  He is on pramipexole 0.5 mg bid.  Last visit, his DBS was reset more similar to the settings he had prior to coming here, just with an increased voltage.  About 2 weeks after our last  visit, the patient decided to go back on the Artane.  The combination of the Artane and the DBS changes helped significantly.  He does ask if I can slightly increased voltage on the left hand, as he has some tremor at night that is bothersome.  Otherwise, he is doing well.  He feels that his balance has been great.  Wearing off:  no  How long before next dose:  n/a Falls:   no N/V:  no Hallucinations:  no  visual distortions: no Lightheaded:  no  Syncope: no Dyskinesia:  no   Neuroimaging has  previously been performed.  It is available for my review today.  This was done in February, 2005 in preparation for DBS.  The examination was essentially unremarkable.  The formal report is below:  Clinical Data: Parkinson's disease.  MRI BRAIN WITHOUT AND WITH CONTRAST  Technique: Stealth protocol.  Multiplanar T1- weighted images were obtained before and after the administration of 20 cc of Omniscan. 5.0, 3.0, and ultimately 1.3 mm thick images were obtained through the brain with special attention to the basal ganglia and deep commissures to allow for stereotactic planning and treatment of Parkinson's disease. There  are no discrete or focal lesions demonstrated on these limited pulse sequences, which do not include a diffusion or T2- weighted pulse sequence. FLAIR images were obtained and show no significant small vessel disease or ventriculomegaly.  IMPRESSION  MRI of the brain without and with contrast performed according to Stealth protocol showing no obvious focal or enhancing lesion.   PREVIOUS MEDICATIONS: Mirapex and artane  ALLERGIES:   Allergies  Allergen Reactions  . Penicillins Rash    Because of a history of documented adverse serious drug reaction;Medi Alert bracelet  is recommended    CURRENT MEDICATIONS:     Medication List       This list is accurate as of: 11/19/12 11:12 AM.  Always use your most recent med list.               albuterol 108 (90 BASE) MCG/ACT  inhaler  Commonly known as:  VENTOLIN HFA  Inhale 2 puffs into the lungs every 6 (six) hours as needed for wheezing.     aspirin EC 81 MG tablet  Take 81 mg by mouth daily.     clonazePAM 0.5 MG tablet  Commonly known as:  KLONOPIN  Take 1 tablet (0.5 mg total) by mouth at bedtime. When using CPAP     Fluticasone-Salmeterol 250-50 MCG/DOSE Aepb  Commonly known as:  ADVAIR  Inhale 1 puff into the lungs 2 (two) times daily as needed (for congestion).     furosemide 80 MG tablet  Commonly known as:  LASIX  TAKE 1 TABLET IN THE MORNING AND 1/2 TABLET IN THE EVENING     glimepiride 2 MG tablet  Commonly known as:  AMARYL  TAKE 1 TABLET BY MOUTH IN THE MORNING AND 1/2 TABLET AT NIGHT     ibuprofen 200 MG tablet  Commonly known as:  ADVIL,MOTRIN  Take 800 mg by mouth daily as needed for pain.     levothyroxine 125 MCG tablet  Commonly known as:  SYNTHROID, LEVOTHROID  Take 125 mcg by mouth daily before breakfast.     losartan 50 MG tablet  Commonly known as:  COZAAR  Take 50 mg by mouth at bedtime.     metoprolol succinate 50 MG 24 hr tablet  Commonly known as:  TOPROL-XL  Take 50 mg by mouth at bedtime. Take with or immediately following a meal.     multivitamin with minerals Tabs tablet  Take 1 tablet by mouth daily. Antioxidant  Multi vitamin     nitroGLYCERIN 0.4 MG SL tablet  Commonly known as:  NITROSTAT  Place 1 tablet (0.4 mg total) under the tongue every 5 (five) minutes as needed for chest pain.     phenazopyridine 200 MG tablet  Commonly known as:  PYRIDIUM  Take 1 tablet (200 mg total) by mouth 3 (three) times daily as needed for pain.     KLOR-CON M10 10 MEQ tablet  Generic drug:  potassium chloride  TAKE 3 TABLETS BY MOUTH DAILY     potassium chloride SA 20 MEQ tablet  Commonly known as:  K-DUR,KLOR-CON  Take 60 mEq by mouth at bedtime.     pramipexole 0.5 MG tablet  Commonly known as:  MIRAPEX  Take 0.5 mg by mouth 2 (two) times daily.      rosuvastatin 20 MG tablet  Commonly known as:  CRESTOR  Take 20 mg by mouth daily.     sennosides-docusate sodium 8.6-50 MG tablet  Commonly known as:  SENOKOT-S  Take 1 tablet  by mouth at bedtime.     JANUMET 50-1000 MG per tablet  Generic drug:  sitaGLIPtin-metformin  TAKE 1 TABLET BY MOUTH TWICE A DAY WITH 2 LARGEST MEALS     sitaGLIPtin-metformin 50-1000 MG per tablet  Commonly known as:  JANUMET  Take 1 tablet by mouth 2 (two) times daily with a meal.     traMADol 50 MG tablet  Commonly known as:  ULTRAM  TAKE 1 TABLET TWICE A DAY AS NEEDED FOR PAIN     traMADol 50 MG tablet  Commonly known as:  ULTRAM  Take 1 tablet (50 mg total) by mouth 2 (two) times daily as needed for pain.     travoprost (benzalkonium) 0.004 % ophthalmic solution  Commonly known as:  TRAVATAN  Place 1 drop into the right eye at bedtime.     trihexyphenidyl 2 MG tablet  Commonly known as:  ARTANE  Take 2 mg by mouth 2 (two) times daily with a meal.     vitamin B-12 1000 MCG tablet  Commonly known as:  CYANOCOBALAMIN  Take 1,000 mcg by mouth daily.         PAST MEDICAL HISTORY:   Past Medical History  Diagnosis Date  . Cardiomyopathy   . CAD (coronary artery disease)   . HTN (hypertension)     pt denies 08/19/12  . MI (myocardial infarction)   . Sleep apnea   . GERD (gastroesophageal reflux disease)   . Hyperthyroidism   . DM (diabetes mellitus)   . Hyperplasia, prostate   . Benign neoplasm of colon   . Nephrolithiasis   . Ventral hernia   . HLD (hyperlipidemia)   . Parkinson disease     1999  . OSA (obstructive sleep apnea)     AHI-28,on CPAP, noncompliant with CPAP  . Sleep apnea, organic   . Complication of anesthesia     pt states that he got a rash  . Arthritis   . Shortness of breath     Hx: of at all times  . UTI (lower urinary tract infection) 09/15/12    Klebsiella    PAST SURGICAL HISTORY:   Past Surgical History  Procedure Laterality Date  . Coronary artery  bypass graft  2000    Henry Stalker, MD  . Coronary stent placement  1998  . Median sternotomy  2000  . Deep brain stimulator placement  2004    Right and left VIN stimulator placement   . Acoustic neuroma resection  1981  . Finger amputation      left pointer  . Cataract extraction w/ intraocular lens implant      Hx: of right eye  . Tonsillectomy    . Colonoscopy w/ biopsies and polypectomy      Hx: of  . Cardiac catheterization    . Subthalamic stimulator battery replacement N/A 09/05/2012    Procedure: Deep brain stimulator battery change;  Surgeon: Henry Harman, MD;  Location: [facility];  Service: Neurosurgery;  Laterality: N/A;  Deep brain stimulator battery change    SOCIAL HISTORY:   History   Social History  . Marital Status: Married    Spouse Name: Henry [name]    Number of Children: 2  . Years of Education: N/A   Occupational History  . 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 Smoking status: Never Smoker   . Smokeless tobacco: Never Used  . Alcohol Use: Yes     Comment:  occasional wine  . Drug Use: No  . Sexual Activity: Not on file   Other Topics Concern  . Not on file   Social History Narrative  . No narrative on file    FAMILY HISTORY:   Family Status  Relation Status Death Age  . Mother Deceased     complications of surgery  . Father Deceased     EtOHism  . Sister Alive     2 full, one half; PVD  . Brother Deceased     AIDS  . Child Alive     3, alive and well    ROS:  A complete 10 system review of systems was obtained and was unremarkable apart from what is mentioned above.  PHYSICAL EXAMINATION:    VITALS:   Filed Vitals:   11/19/12 1028  BP: 112/70  Pulse: 60  Temp: 98 F (36.7 C)  Resp: 18  Height: 5\' 9"  (1.753 m)  Weight: 268 lb 6.4 oz (121.745 kg)    GEN:  The patient appears stated age and is in NAD. HEENT:  Normocephalic, atraumatic.  The mucous membranes are moist. The superficial  temporal arteries are without ropiness or tenderness. CV:  RRR Lungs:  CTAB Neck/HEME:  There are no carotid bruits bilaterally.  Neurological examination:  Orientation: The patient is alert and oriented x3. Fund of knowledge is appropriate.  Recent and remote memory are intact.  Attention and concentration are normal.    Able to name objects and repeat phrases. Cranial nerves: There is good facial symmetry. Pupils are equal round and reactive to light bilaterally. Fundoscopic exam reveals clear margins bilaterally. Extraocular muscles are intact. The visual fields are full to confrontational testing. The speech is fluent and just mildly dysarthric.  He is hypophonic. Soft palate rises symmetrically and there is no tongue deviation. Hearing is intact to conversational tone. Sensation: Sensation is intact to light and pinprick throughout (facial, trunk, extremities). Vibration is intact at the bilateral big toe. There is no extinction with double simultaneous stimulation. There is no sensory dermatomal level identified. Motor: Strength is 5/5 in the bilateral upper and lower extremities.   Shoulder shrug is equal and symmetric.  There is no pronator drift. Deep tendon reflexes: Deep tendon reflexes are 0-1/4 at the bilateral biceps, triceps, brachioradialis, patella and achilles. Plantar responses are downgoing bilaterally.  Movement examination: Tone: There is normal tone in the UE and LE bilaterally Abnormal movements: There is a rare resting tremor of the left upper extremity.  There is no tremor today in the LLE or on the right at all. Coordination:  There is no decremation with RAM's today Gait and Station: The patient has minimal difficulty arising out of a deep-seated chair without the use of the hands. The patient's stride length is decreased in a.  The patient has a neg pull test.      LABS  Lab Results  Component Value Date   WBC 11.7* 09/14/2012   HGB 13.2 09/14/2012   HCT 38.0*  09/14/2012   MCV 82.8 09/14/2012   PLT 151 09/14/2012   Lab Results  Component Value Date   TSH 0.36 02/20/2012     Chemistry      Component Value Date/Time   NA 137 09/14/2012 2252   K 3.4* 09/14/2012 2252   CL 99 09/14/2012 2252   CO2 27 09/14/2012 2252   BUN 15 09/14/2012 2252   CREATININE 0.98 09/14/2012 2252      Component Value Date/Time  CALCIUM 9.6 09/14/2012 2252   ALKPHOS 73 09/14/2012 2252   AST 12 09/14/2012 2252   ALT 17 09/14/2012 2252   BILITOT 0.4 09/14/2012 2252     Lab Results  Component Value Date   VITAMINB12 268 08/19/2012   Lab Results  Component Value Date   HGBA1C 7.8* 09/17/2012   DBS programming was performed today, which is described in more detail on a separate programming procedural notes.  In short, there was no significant change following programming.  ASSESSMENT/PLAN:  1.  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    -I am going to continue the artane for now since he feels he is doing much better on the medication they'll also had an  -As above, settings were changed today.  -He does not wish to go to the neuro rehabilitation center currently for the Parkinson's program  -Programmed the patient programmer so that he could go up or down by 0.2 V on each side.  He and his daughter were shown how to use this. 2.  RBD  -I encouraged the patient to use his clonazepam, 0.5 mg, half to one tablet at night.  He has very significant RBD, and even rolled out of bed once and hit his head, which required stitches.  He has very vivid dreams that he acts out on at least 3 times per week. 3.  OSAS, noncompliant with CPAP.  -He follows with Dr. Vickey [name].  He understands morbidity and mortality associated with untreated sleep apnea.  Weight loss would be of value 4.  Sialorrhea  -This is associated with Parkinson's disease.  He may benefit from Myobloc in the future. 5.  Dysphagia.  -Overall, this is fairly mild but I will give an eye on this and consider a  MBE if needed. 6.  I will see him back in 3 months, sooner should new neurologic issues arise.

## 2012-11-19 NOTE — Procedures (Signed)
DBS Programming was performed.    Total time spent programming was 40 minutes.  Device was confirmed to be on.  Soft start was confirmed to be on.  Impedences were checked and were within normal limits.  Battery was checked and was determined to be functioning normally and not near the end of life.  Final settings were as follows:  Left brain electrode:     1-2+           ; Amplitude  4.0   V   ; Pulse width 90 microseconds;   Frequency  170    Hz.  Right brain electrode:     4-7+          ; Amplitude   3.9  V ;  Pulse width 90  microseconds;  Frequency   170    Hz.

## 2012-12-04 ENCOUNTER — Encounter: Payer: Self-pay | Admitting: Internal Medicine

## 2012-12-04 ENCOUNTER — Other Ambulatory Visit: Payer: Self-pay | Admitting: Internal Medicine

## 2012-12-04 ENCOUNTER — Ambulatory Visit (INDEPENDENT_AMBULATORY_CARE_PROVIDER_SITE_OTHER): Payer: Federal, State, Local not specified - PPO | Admitting: Internal Medicine

## 2012-12-04 VITALS — BP 112/72 | HR 76 | Temp 97.9°F | Ht 69.75 in | Wt 269.2 lb

## 2012-12-04 DIAGNOSIS — E1159 Type 2 diabetes mellitus with other circulatory complications: Secondary | ICD-10-CM | POA: Diagnosis not present

## 2012-12-04 DIAGNOSIS — G20 Parkinson's disease: Secondary | ICD-10-CM

## 2012-12-04 DIAGNOSIS — R5381 Other malaise: Secondary | ICD-10-CM | POA: Diagnosis not present

## 2012-12-04 DIAGNOSIS — R972 Elevated prostate specific antigen [PSA]: Secondary | ICD-10-CM

## 2012-12-04 DIAGNOSIS — Z23 Encounter for immunization: Secondary | ICD-10-CM | POA: Diagnosis not present

## 2012-12-04 DIAGNOSIS — I2589 Other forms of chronic ischemic heart disease: Secondary | ICD-10-CM

## 2012-12-04 DIAGNOSIS — E890 Postprocedural hypothyroidism: Secondary | ICD-10-CM

## 2012-12-04 DIAGNOSIS — G20A1 Parkinson's disease without dyskinesia, without mention of fluctuations: Secondary | ICD-10-CM

## 2012-12-04 DIAGNOSIS — G4733 Obstructive sleep apnea (adult) (pediatric): Secondary | ICD-10-CM

## 2012-12-04 LAB — HEMOGLOBIN A1C: Hgb A1c MFr Bld: 7.6 % — ABNORMAL HIGH (ref 4.6–6.5)

## 2012-12-04 LAB — CBC WITH DIFFERENTIAL/PLATELET
Basophils Absolute: 0 10*3/uL (ref 0.0–0.1)
Eosinophils Absolute: 0.3 10*3/uL (ref 0.0–0.7)
HCT: 40.5 % (ref 39.0–52.0)
Lymphs Abs: 1.9 10*3/uL (ref 0.7–4.0)
MCHC: 32.8 g/dL (ref 30.0–36.0)
MCV: 83 fl (ref 78.0–100.0)
Monocytes Absolute: 0.5 10*3/uL (ref 0.1–1.0)
Monocytes Relative: 7.3 % (ref 3.0–12.0)
Neutro Abs: 4.7 10*3/uL (ref 1.4–7.7)
Platelets: 161 10*3/uL (ref 150.0–400.0)
RDW: 14.9 % — ABNORMAL HIGH (ref 11.5–14.6)

## 2012-12-04 LAB — BASIC METABOLIC PANEL
BUN: 18 mg/dL (ref 6–23)
CO2: 31 mEq/L (ref 19–32)
GFR: 97.28 mL/min (ref >60.00)
Glucose, Bld: 152 mg/dL — ABNORMAL HIGH (ref 70–99)
Potassium: 3.5 mEq/L (ref 3.5–5.1)

## 2012-12-04 LAB — HEPATIC FUNCTION PANEL
Albumin: 4 g/dL (ref 3.5–5.2)
Total Protein: 6.9 g/dL (ref 6.0–8.3)

## 2012-12-04 LAB — TSH: TSH: 0.25 u[IU]/mL — ABNORMAL LOW (ref 0.35–5.50)

## 2012-12-04 MED ORDER — LEVOTHYROXINE SODIUM 125 MCG PO TABS: 125.0000 ug | ORAL_TABLET | Freq: Every day | ORAL | Status: DC

## 2012-12-04 NOTE — Progress Notes (Signed)
Pre visit review using our clinic review tool, if applicable. No additional management support is needed unless otherwise documented below in the visit note. 

## 2012-12-04 NOTE — Progress Notes (Signed)
  Subjective:    Patient ID: Henry [name], male    DOB: [birth year], 62 y.o.   MRN: [id number]  HPI   He describes fatigue for several months which is essentially constant, occurring even at rest.  He has associated hoarseness, dyspnea, paroxysmal nocturnal dyspnea, muscle pain and weakness. He has documented apnea; he has CPAP  in the house but he is intolerant to it.  He has had his thyroid ablated with I 131 in May of 2013.  He has balance problems but this is in the context of his Parkinson's. His family describe anxiety & depression.   His most recent labs were 09/14/12. This revealed potassium of 3.4, GFR 87, white count 11,700, hematocrit 38, and glucose of 185. His most recent A1c was 7.8% 09/17/12. His PSA was 18.59 ; he is followed by [facility].   Review of Systems  There is no associated fever, chills, sweats, or change in weight  Visual changes such as blurring, diplopia, or vision loss are denied  He has no significant extrinsic symptoms despite the hoarseness  The shortness of breath is not associated with cough or sputum production  He has no change in heart rhythm or rate or pedal edema  He has had some constipation but no diarrhea, melena, rectal bleeding.  Despite the joint pain he is not noted visible swelling or redness  There is no new changes hair, skin, nails  No abnormal bruising or bleeding or enlarged lymph nodes have been documented.     Objective:   Physical Exam Gen.: adequately nourished in appearance but weight excess. Flat affect but cooperative throughout exam.  Eyes: No corneal or conjunctival inflammation noted.  Nose: External nasal exam reveals no deformity or inflammation. Nasal mucosa are pink and moist. No lesions or exudates noted.   Mouth: Oral mucosa and oropharynx reveal no lesions or exudates. Dramatic oropharyngeal crowding. Neck: No deformities, masses, or tenderness noted.  Thyroid normal. Lungs: Normal respiratory  effort; chest expands symmetrically. Lungs are clear to auscultation without rales, wheezes, or increased work of breathing. Decreased BS. Heart: Normal rate and rhythm. Normal S1 and S2. No gallop, click, or rub. Grade 1 systolic murmur. Abdomen: Bowel sounds normal; abdomen soft and nontender. No masses, organomegaly or hernias noted. Protuberant                                    Musculoskeletal/extremities:Hand joints normal . Fingernail  health good. Able to lie down & sit up with minimal help.  Vascular: Carotid, radial artery, dorsalis pedis and  posterior tibial pulses are full and equal. No bruits present. Neurologic: Parkinsonian stigmata      Skin: Intact without suspicious lesions or rashes. Lymph: No cervical, axillary lymphadenopathy present. Psych: Mood and affect are normal. Normally interactive                                                                                        Assessment & Plan:  #1 fatigue, multifactorial  #2also see Current Assessment & Plan in Problem List under specific Diagnosis

## 2012-12-04 NOTE — Patient Instructions (Signed)
Your next office appointment will be determined based upon review of your pending labs . Those instructions will be transmitted to you through My Chart  . Please employ the CPAP to prevent increased intrathoracic pressure ; potentially health threatening cardiac irregularities; and heart failure as we discussed.  Eat a low-fat diet with lots of fruits and vegetables, up to 7-9 servings per day. Consume less than 40  Grams (preferably ZERO) of sugar per day from foods & drinks with High Fructose Corn Syrup (HFCS) sugar as #1,2,3 or # 4 on label. Follow a  low carb nutrition program such as [place] or [employer]  to prevent Diabetes progression . White carbohydrates (potatoes, rice, bread, and pasta) have a high spike of sugar and a high load of sugar. For example a  baked potato has a cup of sugar and a  french fry  2 teaspoons of sugar. Yams, wild  rice, whole grained bread &  wheat pasta have been much lower spike and load of  sugar. Portions should be the size of a deck of cards or your palm.

## 2012-12-12 ENCOUNTER — Encounter: Payer: Self-pay | Admitting: Internal Medicine

## 2012-12-13 ENCOUNTER — Other Ambulatory Visit: Payer: Self-pay | Admitting: Internal Medicine

## 2012-12-16 ENCOUNTER — Telehealth: Payer: Self-pay | Admitting: *Deleted

## 2012-12-16 NOTE — Telephone Encounter (Signed)
Called and spoke with patient to let him know his prescription for Tramadol is ready for pick up at our front desk and that a UDS is required. He said ok.

## 2012-12-16 NOTE — Telephone Encounter (Signed)
traMADol (ULTRAM) 50 MG tablet Last refill: 10.26.14 #30, 1 refill Last OV: 11.26.14 Last UDS: Contract on file; needs UDS

## 2012-12-16 NOTE — Telephone Encounter (Signed)
OK X1 

## 2012-12-18 ENCOUNTER — Encounter: Payer: Self-pay | Admitting: Internal Medicine

## 2012-12-19 MED ORDER — ONETOUCH DELICA LANCETS FINE MISC: Status: DC

## 2012-12-19 MED ORDER — GLUCOSE BLOOD VI STRP: ORAL_STRIP | Status: DC

## 2012-12-21 ENCOUNTER — Other Ambulatory Visit: Payer: Self-pay | Admitting: Cardiology

## 2012-12-29 ENCOUNTER — Other Ambulatory Visit: Payer: Self-pay | Admitting: Endocrinology

## 2012-12-29 DIAGNOSIS — E890 Postprocedural hypothyroidism: Secondary | ICD-10-CM

## 2012-12-30 ENCOUNTER — Ambulatory Visit: Payer: Federal, State, Local not specified - PPO | Admitting: Neurology

## 2013-01-04 ENCOUNTER — Other Ambulatory Visit: Payer: Self-pay | Admitting: Endocrinology

## 2013-01-17 ENCOUNTER — Other Ambulatory Visit: Payer: Self-pay | Admitting: Internal Medicine

## 2013-01-20 ENCOUNTER — Ambulatory Visit (INDEPENDENT_AMBULATORY_CARE_PROVIDER_SITE_OTHER): Payer: Federal, State, Local not specified - PPO | Admitting: Family Medicine

## 2013-01-20 ENCOUNTER — Telehealth: Payer: Self-pay | Admitting: Internal Medicine

## 2013-01-20 ENCOUNTER — Encounter: Payer: Self-pay | Admitting: Family Medicine

## 2013-01-20 ENCOUNTER — Other Ambulatory Visit: Payer: Self-pay | Admitting: *Deleted

## 2013-01-20 VITALS — BP 124/76 | HR 80 | Temp 98.4°F | Wt 271.0 lb

## 2013-01-20 DIAGNOSIS — J019 Acute sinusitis, unspecified: Secondary | ICD-10-CM

## 2013-01-20 DIAGNOSIS — J209 Acute bronchitis, unspecified: Secondary | ICD-10-CM

## 2013-01-20 DIAGNOSIS — K439 Ventral hernia without obstruction or gangrene: Secondary | ICD-10-CM | POA: Diagnosis not present

## 2013-01-20 MED ORDER — CLARITHROMYCIN ER 500 MG PO TB24: 1000.0000 mg | ORAL_TABLET | Freq: Every day | ORAL | Status: AC

## 2013-01-20 MED ORDER — TRAMADOL HCL 50 MG PO TABS: ORAL_TABLET | ORAL | Status: DC

## 2013-01-20 MED ORDER — GUAIFENESIN-CODEINE 100-10 MG/5ML PO SYRP: ORAL_SOLUTION | ORAL | Status: DC

## 2013-01-20 NOTE — Progress Notes (Signed)
Pre visit review using our clinic review tool, if applicable. No additional management support is needed unless otherwise documented below in the visit note. 

## 2013-01-20 NOTE — Telephone Encounter (Signed)
Patient is calling to request a refill on his Tramadol rx. Please advise.

## 2013-01-20 NOTE — Telephone Encounter (Signed)
traMADol (ULTRAM) 50 MG tablet Last refill: 12/13/12 #30, 0 refills Last OV: 12/04/2012 Contract on file

## 2013-01-20 NOTE — Telephone Encounter (Signed)
Ventolin and Advair refilled per protocol. JG//CMA

## 2013-01-20 NOTE — Progress Notes (Signed)
  Subjective:     Henry [name] is a 63 y.o. male here for evaluation of a cough. Onset of symptoms was 7 days ago. Symptoms have been gradually worsening since that time. The cough is nonproductive and is aggravated by infection and reclining position. Associated symptoms include: chills, shortness of breath and wheezing. Patient does not have a history of asthma. Patient does not have a history of environmental allergens. Patient has not traveled recently. Patient does not have a history of smoking. Patient has not had a previous chest x-ray. Patient has not had a PPD done.  The following portions of the patient's history were reviewed and updated as appropriate: allergies, current medications, past family history, past medical history, past social history, past surgical history and problem list.  Review of Systems Pertinent items are noted in HPI.    Objective:    Oxygen saturation 95% on room air BP 124/76  Pulse 80  Temp(Src) 98.4 F (36.9 C) (Oral)  Wt 271 lb (122.925 kg)  SpO2 95% General appearance: alert, cooperative, appears stated age and no distress Ears: normal TM's and external ear canals both ears Nose: green discharge, moderate congestion, turbinates red, swollen, sinus tenderness bilateral Throat: lips, mucosa, and tongue normal; teeth and gums normal Neck: mild anterior cervical adenopathy, supple, symmetrical, trachea midline and thyroid not enlarged, symmetric, no tenderness/mass/nodules Lungs: clear to auscultation bilaterally Heart: S1, S2 normal    Assessment:    Acute Bronchitis    Plan:    Antibiotics per medication orders. Antitussives per medication orders. Avoid exposure to tobacco smoke and fumes. Call if shortness of breath worsens, blood in sputum, change in character of cough, development of fever or chills, inability to maintain nutrition and hydration. Avoid exposure to tobacco smoke and fumes.

## 2013-01-20 NOTE — Telephone Encounter (Signed)
OK X1 

## 2013-01-20 NOTE — Telephone Encounter (Signed)
Tramadol script faxed to pharmacy. JG//CMA

## 2013-01-20 NOTE — Patient Instructions (Addendum)
HOLD CRESTOR WHILE ON BIAXIN     Bronchitis Bronchitis is inflammation of the airways that extend from the windpipe into the lungs (bronchi). The inflammation often causes mucus to develop, which leads to a cough. If the inflammation becomes severe, it may cause shortness of breath. CAUSES  Bronchitis may be caused by:   Viral infections.   Bacteria.   Cigarette smoke.   Allergens, pollutants, and other irritants.  SIGNS AND SYMPTOMS  The most common symptom of bronchitis is a frequent cough that produces mucus. Other symptoms include:  Fever.   Body aches.   Chest congestion.   Chills.   Shortness of breath.   Sore throat.  DIAGNOSIS  Bronchitis is usually diagnosed through a medical history and physical exam. Tests, such as chest X-rays, are sometimes done to rule out other conditions.  TREATMENT  You may need to avoid contact with whatever caused the problem (smoking, for example). Medicines are sometimes needed. These may include:  Antibiotics. These may be prescribed if the condition is caused by bacteria.  Cough suppressants. These may be prescribed for relief of cough symptoms.   Inhaled medicines. These may be prescribed to help open your airways and make it easier for you to breathe.   Steroid medicines. These may be prescribed for those with recurrent (chronic) bronchitis. HOME CARE INSTRUCTIONS  Get plenty of rest.   Drink enough fluids to keep your urine clear or pale yellow (unless you have a medical condition that requires fluid restriction). Increasing fluids may help thin your secretions and will prevent dehydration.   Only take over-the-counter or prescription medicines as directed by your health care provider.  Only take antibiotics as directed. Make sure you finish them even if you start to feel better.  Avoid secondhand smoke, irritating chemicals, and strong fumes. These will make bronchitis worse. If you are a smoker, quit  smoking. Consider using nicotine gum or skin patches to help control withdrawal symptoms. Quitting smoking will help your lungs heal faster.   Put a cool-mist humidifier in your bedroom at night to moisten the air. This may help loosen mucus. Change the water in the humidifier daily. You can also run the hot water in your shower and sit in the bathroom with the door closed for 5 10 minutes.   Follow up with your health care provider as directed.   Wash your hands frequently to avoid catching bronchitis again or spreading an infection to others.  SEEK MEDICAL CARE IF: Your symptoms do not improve after 1 week of treatment.  SEEK IMMEDIATE MEDICAL CARE IF:  Your fever increases.  You have chills.   You have chest pain.   You have worsening shortness of breath.   You have bloody sputum.  You faint.  You have lightheadedness.  You have a severe headache.   You vomit repeatedly. MAKE SURE YOU:   Understand these instructions.  Will watch your condition.  Will get help right away if you are not doing well or get worse. Document Released: 12/26/2004 Document Revised: 10/16/2012 Document Reviewed: 08/20/2012 [facility] Patient Information 2014 [place].

## 2013-01-22 ENCOUNTER — Encounter: Payer: Self-pay | Admitting: Neurology

## 2013-01-27 ENCOUNTER — Ambulatory Visit (INDEPENDENT_AMBULATORY_CARE_PROVIDER_SITE_OTHER): Payer: Federal, State, Local not specified - PPO | Admitting: Surgery

## 2013-01-27 ENCOUNTER — Encounter (INDEPENDENT_AMBULATORY_CARE_PROVIDER_SITE_OTHER): Payer: Self-pay | Admitting: Surgery

## 2013-01-27 VITALS — BP 128/82 | HR 72 | Temp 98.5°F | Resp 14 | Ht 68.0 in | Wt 272.8 lb

## 2013-01-27 DIAGNOSIS — K429 Umbilical hernia without obstruction or gangrene: Secondary | ICD-10-CM | POA: Diagnosis not present

## 2013-01-27 NOTE — Patient Instructions (Signed)
Hernia A hernia occurs when an internal organ pushes out through a weak spot in the abdominal wall. Hernias most commonly occur in the groin and around the navel. Hernias often can be pushed back into place (reduced). Most hernias tend to get worse over time. Some abdominal hernias can get stuck in the opening (irreducible or incarcerated hernia) and cannot be reduced. An irreducible abdominal hernia which is tightly squeezed into the opening is at risk for impaired blood supply (strangulated hernia). A strangulated hernia is a medical emergency. Because of the risk for an irreducible or strangulated hernia, surgery may be recommended to repair a hernia. CAUSES   Heavy lifting.  Prolonged coughing.  Straining to have a bowel movement.  A cut (incision) made during an abdominal surgery. HOME CARE INSTRUCTIONS   Bed rest is not required. You may continue your normal activities.  Avoid lifting more than 10 pounds (4.5 kg) or straining.  Cough gently. If you are a smoker it is best to stop. Even the best hernia repair can break down with the continual strain of coughing. Even if you do not have your hernia repaired, a cough will continue to aggravate the problem.  Do not wear anything tight over your hernia. Do not try to keep it in with an outside bandage or truss. These can damage abdominal contents if they are trapped within the hernia sac.  Eat a normal diet.  Avoid constipation. Straining over long periods of time will increase hernia size and encourage breakdown of repairs. If you cannot do this with diet alone, stool softeners may be used. SEEK IMMEDIATE MEDICAL CARE IF:   You have a fever.  You develop increasing abdominal pain.  You feel nauseous or vomit.  Your hernia is stuck outside the abdomen, looks discolored, feels hard, or is tender.  You have any changes in your bowel habits or in the hernia that are unusual for you.  You have increased pain or swelling around the  hernia.  You cannot push the hernia back in place by applying gentle pressure while lying down. MAKE SURE YOU:   Understand these instructions.  Will watch your condition.  Will get help right away if you are not doing well or get worse. Document Released: 12/26/2004 Document Revised: 03/20/2011 Document Reviewed: 08/15/2007 [employer] Patient Information 2014 [employer].  

## 2013-01-27 NOTE — Progress Notes (Signed)
Patient ID: Henry [name], male   DOB: 1950/05/12, 64 y.o.   MRN: [id number]  Chief Complaint  Patient presents with  . New Evaluation    eval ventral hernia    HPI Henry [name] is a 63 y.o. male. Patient sent request of Dr. Linna Darner for umbilical hernia. He has been present for 2 years. He has recently suffered from bronchitis and has been coughing. The hernias but more uncomfortable. It pops and it pops out. Today it is nontender. HPI  Past Medical History  Diagnosis Date  . Cardiomyopathy   . CAD (coronary artery disease)   . HTN (hypertension)     pt denies 08/19/12  . MI (myocardial infarction)   . Sleep apnea   . GERD (gastroesophageal reflux disease)   . Hyperthyroidism   . DM (diabetes mellitus)   . Hyperplasia, prostate   . Benign neoplasm of colon   . Nephrolithiasis   . Ventral hernia   . HLD (hyperlipidemia)   . Parkinson disease     1999  . OSA (obstructive sleep apnea)     AHI-28,on CPAP, noncompliant with CPAP  . Sleep apnea, organic   . Complication of anesthesia     pt states that he got a rash  . Arthritis   . Shortness of breath     Hx: of at all times  . UTI (lower urinary tract infection) 09/15/12    Klebsiella  . Asthma     Past Surgical History  Procedure Laterality Date  . Coronary artery bypass graft  2000    [name], MD  . Coronary stent placement  1998  . Median sternotomy  2000  . Deep brain stimulator placement  2004    Right and left VIN stimulator placement   . Acoustic neuroma resection  1981  . Finger amputation      left pointer  . Cataract extraction w/ intraocular lens implant      Hx: of right eye  . Tonsillectomy    . Colonoscopy w/ biopsies and polypectomy      Hx: of  . Cardiac catheterization    . Subthalamic stimulator battery replacement N/A 09/05/2012    Procedure: Deep brain stimulator battery change;  Surgeon: Erline Levine, MD;  Location: [facility];  Service: Neurosurgery;  Laterality: N/A;  Deep brain  stimulator battery change    Family History  Problem Relation Age of Onset  . Peripheral vascular disease    . Arthritis    . Diabetes    . Alcoholism Mother 60    deceased   . Aneurysm Father 61  . Autoimmune disease Brother 36    AIDS    Social History History  Substance Use Topics  . Smoking status: Never Smoker   . Smokeless tobacco: Never Used  . Alcohol Use: Yes     Comment: occasional wine    Allergies  Allergen Reactions  . Penicillins Rash    Because of a history of documented adverse serious drug reaction;Medi Alert bracelet  is recommended    Current Outpatient Prescriptions  Medication Sig Dispense Refill  . ADVAIR DISKUS 250-50 MCG/DOSE AEPB INHALE 1 PUFF INTO THE LUNGS 2 TIMES DAILY.  60 each  1  . aspirin EC 81 MG tablet Take 81 mg by mouth daily.      . clarithromycin (BIAXIN XL) 500 MG 24 hr tablet Take 2 tablets (1,000 mg total) by mouth daily.  28 tablet  0  . clonazePAM (KLONOPIN) 0.5  MG tablet Take 1 tablet (0.5 mg total) by mouth at bedtime. When using CPAP  90 tablet  1  . furosemide (LASIX) 80 MG tablet TAKE 1 TABLET IN THE MORNING AND 1/2 TABLET IN THE EVENING  45 tablet  5  . glimepiride (AMARYL) 2 MG tablet TAKE 1 TABLET BY MOUTH IN THE MORNING AND 1/2 TABLET AT NIGHT  135 tablet  1  . glucose blood (ONE TOUCH ULTRA TEST) test strip Check blood sugar once a day  100 each  12  . guaiFENesin-codeine (ROBITUSSIN AC) 100-10 MG/5ML syrup 1-2 tsp po qhs prn cough  120 mL  0  . ibuprofen (ADVIL,MOTRIN) 200 MG tablet Take 800 mg by mouth daily as needed for pain.      Marland Kitchen JANUMET 50-1000 MG per tablet TAKE 1 TABLET BY MOUTH TWICE A DAY WITH 2 LARGEST MEALS  60 tablet  3  . KLOR-CON M10 10 MEQ tablet TAKE 3 TABLETS BY MOUTH DAILY  90 tablet  3  . levothyroxine (SYNTHROID, LEVOTHROID) 125 MCG tablet Take 1 tablet (125 mcg total) by mouth daily before breakfast. 12/04/12 :1 qd EXCEPT 1/2 Tues & Th      . losartan (COZAAR) 50 MG tablet Take 50 mg by mouth at  bedtime.      . metoprolol succinate (TOPROL-XL) 50 MG 24 hr tablet TAKE 1 TABLET BY MOUTH EVERY DAY  30 tablet  6  . Multiple Vitamin (MULTIVITAMIN WITH MINERALS) TABS tablet Take 1 tablet by mouth daily. Antioxidant  Multi vitamin      . nitroGLYCERIN (NITROSTAT) 0.4 MG SL tablet Place 1 tablet (0.4 mg total) under the tongue every 5 (five) minutes as needed for chest pain.  25 tablet  12  . ONETOUCH DELICA LANCETS FINE MISC Use as directed  100 each  11  . pramipexole (MIRAPEX) 0.5 MG tablet Take 0.5 mg by mouth 2 (two) times daily.      . rosuvastatin (CRESTOR) 20 MG tablet Take 20 mg by mouth daily.      . traMADol (ULTRAM) 50 MG tablet TAKE 1 TABLET TWICE A DAY AS NEEDED FOR PAIN  30 tablet  0  . travoprost, benzalkonium, (TRAVATAN) 0.004 % ophthalmic solution Place 1 drop into the right eye at bedtime.       . trihexyphenidyl (ARTANE) 2 MG tablet Take 2 mg by mouth 2 (two) times daily with a meal.      . VENTOLIN HFA 108 (90 BASE) MCG/ACT inhaler INHALE 2 PUFFS INTO THE LUNGS EVERY 6 HOURS AS NEEDED FOR WHEEZING  18 each  1  . vitamin B-12 (CYANOCOBALAMIN) 1000 MCG tablet Take 1,000 mcg by mouth daily.      . sennosides-docusate sodium (SENOKOT-S) 8.6-50 MG tablet Take 1 tablet by mouth at bedtime.       No current facility-administered medications for this visit.    Review of Systems Review of Systems  Constitutional: Negative.   HENT: Negative.   Respiratory: Positive for cough.   Cardiovascular: Negative.   Gastrointestinal: Negative.   Endocrine: Negative.   Genitourinary: Negative.   Allergic/Immunologic: Negative.   Neurological: Negative.   Hematological: Negative.   Psychiatric/Behavioral: Negative.     Blood pressure 128/82, pulse 72, temperature 98.5 F (36.9 C), temperature source Temporal, resp. rate 14, height 5\' 8"  (1.727 m), weight 272 lb 12.8 oz (123.741 kg).  Physical Exam Physical Exam  Constitutional: He is oriented to person, place, and time. He appears  well-developed and well-nourished.  HENT:  Head: Normocephalic and atraumatic.  Eyes: EOM are normal. Pupils are equal, round, and reactive to light.  Neck: Normal range of motion. Neck supple.  Cardiovascular: Normal rate and regular rhythm.   Pulmonary/Chest: Effort normal and breath sounds normal.  Abdominal:    Musculoskeletal: Normal range of motion.  Neurological: He is alert and oriented to person, place, and time.  Skin: Skin is warm and dry.  Psychiatric: He has a normal mood and affect. His behavior is normal. Judgment and thought content normal.    Data Reviewed Dr Linna Darner notes  Assessment    Umbilical hernia small reducible    Plan    Discussed hernia pathophysiology with patient. He is a small umbilical hernia is reducible. She's been suffering from a cough of late and I told him to wait to layer on this year to fix it. It is not very tender and is reducible and can be watched right now. He will need her parents some point down the road and I told him to call me later on in the spring. Discussed incarceration with him and symptoms to look for. Information about hernias given.       Fredrico Beedle A. 01/27/2013, 2:30 PM

## 2013-02-01 ENCOUNTER — Other Ambulatory Visit: Payer: Self-pay | Admitting: Internal Medicine

## 2013-02-03 NOTE — Telephone Encounter (Signed)
Tramadol already refilled

## 2013-02-06 ENCOUNTER — Encounter: Payer: Self-pay | Admitting: Internal Medicine

## 2013-02-07 ENCOUNTER — Other Ambulatory Visit: Payer: Self-pay | Admitting: Internal Medicine

## 2013-02-07 MED ORDER — SITAGLIPTIN PHOS-METFORMIN HCL 50-1000 MG PO TABS: ORAL_TABLET | ORAL | Status: DC

## 2013-02-07 NOTE — Telephone Encounter (Signed)
OK #[id number],R X 2

## 2013-02-07 NOTE — Telephone Encounter (Signed)
Requesting Tramadol 50mg  1 tablet bid prn Last refill:01-20-13:#30 Last OV:12-04-12 CBS:[id number]. Please advise.//AB/CMA

## 2013-02-10 ENCOUNTER — Encounter: Payer: Self-pay | Admitting: Internal Medicine

## 2013-02-11 MED ORDER — TRAMADOL HCL 50 MG PO TABS: ORAL_TABLET | ORAL | Status: DC

## 2013-02-11 NOTE — Telephone Encounter (Signed)
Rx printed and faxed to the pharmacy.//AB/CMA 

## 2013-02-11 NOTE — Addendum Note (Signed)
Addended by: Harl Bowie on: 02/11/2013 02:26 PM   Modules accepted: Orders

## 2013-02-19 ENCOUNTER — Ambulatory Visit (INDEPENDENT_AMBULATORY_CARE_PROVIDER_SITE_OTHER): Payer: Federal, State, Local not specified - PPO | Admitting: Neurology

## 2013-02-19 ENCOUNTER — Encounter: Payer: Self-pay | Admitting: Neurology

## 2013-02-19 VITALS — BP 144/66 | HR 72 | Resp 20 | Ht 68.0 in | Wt 273.6 lb

## 2013-02-19 DIAGNOSIS — K117 Disturbances of salivary secretion: Secondary | ICD-10-CM | POA: Diagnosis not present

## 2013-02-19 DIAGNOSIS — G20 Parkinson's disease: Secondary | ICD-10-CM | POA: Diagnosis not present

## 2013-02-19 DIAGNOSIS — G4752 REM sleep behavior disorder: Secondary | ICD-10-CM | POA: Insufficient documentation

## 2013-02-19 MED ORDER — CLONAZEPAM 0.5 MG PO TABS: ORAL_TABLET | ORAL | Status: DC

## 2013-02-19 NOTE — Patient Instructions (Signed)
1. Increase clonazepam to one and a half tablets by mouth at bedtime.  2. Follow up in 4 months.  3. Call with any problems.

## 2013-02-19 NOTE — Procedures (Signed)
DBS Programming was performed.    Total time spent programming was 40 minutes.  Device was confirmed to be on.  Soft start was confirmed to be on.  Impedences were checked and were within normal limits.  Battery was checked and was determined to be functioning normally and not near the end of life.  Final settings were as follows:  Left brain electrode:     1-2+           ; Amplitude  4.0   V   ; Pulse width 90 microseconds;   Frequency   160   Hz.  Right brain electrode:     4-7+          ; Amplitude   3.9  V ;  Pulse width 90  microseconds;  Frequency   160    Hz.

## 2013-02-19 NOTE — Progress Notes (Signed)
Henry [name] was seen today in the movement disorders clinic for neurologic consultation at the request of [name].  His PCP is [name], MD.  The consultation is for the evaluation of PD and to manage his DBS.  He is accompanied by his wife who supplements the history.  The first symptom(s) the patient noticed was right hand tremor in 1999.  He was seen by neurology and was dx with PD.  He was placed on something, but it caused sleepiness.  He does not think that he has ever been on levodopa.   He was placed on Mirapex, which seemed to help.  He only takes it twice per day.  He didn't think it made a difference when he took it tid.  He began to have tremor and it was suggested he do DBS.    The pt is s/p stn DBS in 2005.  He had a battery change in 2009.    10/02/12 update: The patient is accompanied today by his daughter, who supplements the history.  I reviewed medical records available to me since last visit.  The patient had his generator changed on 09/05/2012.  He remains on pramipexole, 0.5 mg twice per day.  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 [name] in that regard.  He is not compliant with CPAP so says that he doesn't want to return to her for f/u. He doesn't take the klonopin faithfully.  10/11/12 update:  Pt was seen as a walk in/work in today.  Pt had increasing tremor, L greater than R for 3 days.  Thinks that it started with the d/c of artane.  Speech stable.  11/19/12 update:  Pt is seen today for his PD, accompanied by his daughter who supplements the hx.    He is currently on klonopin 0.5 mg - 1/2-1 tablet q hs.  He only takes it when his wife is not working.  She works nights and is only in 2 days per week.  He has some reluctance to take it other nights.  He is on pramipexole 0.5 mg bid.  Last visit, his DBS was reset more similar to the settings he had prior to coming here, just with an increased voltage.  About 2 weeks after our last  visit, the patient decided to go back on the Artane.  The combination of the Artane and the DBS changes helped significantly.  He does ask if I can slightly increased voltage on the left hand, as he has some tremor at night that is bothersome.  Otherwise, he is doing well.  He feels that his balance has been great.  02/19/13 update:  This patient is accompanied in the office by his child who supplements the history.  The pt has a hx of PD.  He has been tremor free and is very happy about that.  He has had some increased balance loss; the last fall was a few months ago but he hasn't gotten hurt.  Was considering hernia surgery.  The records that were made available to me were reviewed.  He is holding on that for now but plans to have it done before end of year.  No hallucinations.  He is still having some acting out of the dreams, despite clonazepam.   Neuroimaging has  previously been performed.  It is available for my review today.  This was done in February, 2005 in preparation for DBS.  The examination was essentially unremarkable.  The formal report is below:  Clinical Data: Parkinson's disease.  MRI BRAIN WITHOUT AND WITH CONTRAST  Technique: Stealth protocol.  Multiplanar T1- weighted images were obtained before and after the administration of 20 cc of Omniscan. 5.0, 3.0, and ultimately 1.3 mm thick images were obtained through the brain with special attention to the basal ganglia and deep commissures to allow for stereotactic planning and treatment of Parkinson's disease. There are no discrete or focal lesions demonstrated on these limited pulse sequences, which do not include a diffusion or T2- weighted pulse sequence. FLAIR images were obtained and show no significant small vessel disease or ventriculomegaly.  IMPRESSION  MRI of the brain without and with contrast performed according to Stealth protocol showing no obvious focal or enhancing lesion.   PREVIOUS MEDICATIONS: Mirapex and  artane  ALLERGIES:   Allergies  Allergen Reactions  . Penicillins Rash    Because of a history of documented adverse serious drug reaction;Medi Alert bracelet  is recommended    CURRENT MEDICATIONS:     Medication List       This list is accurate as of: 02/19/13 11:12 AM.  Always use your most recent med list.               ADVAIR DISKUS 250-50 MCG/DOSE Aepb  Generic drug:  Fluticasone-Salmeterol  INHALE 1 PUFF INTO THE LUNGS 2 TIMES DAILY.     aspirin EC 81 MG tablet  Take 81 mg by mouth daily.     clonazePAM 0.5 MG tablet  Commonly known as:  KLONOPIN  Take 1 tablet (0.5 mg total) by mouth at bedtime. When using CPAP     furosemide 80 MG tablet  Commonly known as:  LASIX  TAKE 1 TABLET IN THE MORNING AND 1/2 TABLET IN THE EVENING     glimepiride 2 MG tablet  Commonly known as:  AMARYL  TAKE 1 TABLET BY MOUTH IN THE MORNING AND 1/2 TABLET AT NIGHT     glucose blood test strip  Commonly known as:  ONE TOUCH ULTRA TEST  Check blood sugar once a day     ibuprofen 200 MG tablet  Commonly known as:  ADVIL,MOTRIN  Take 800 mg by mouth daily as needed for pain.     KLOR-CON M10 10 MEQ tablet  Generic drug:  potassium chloride  TAKE 3 TABLETS BY MOUTH DAILY     levothyroxine 125 MCG tablet  Commonly known as:  SYNTHROID, LEVOTHROID  Take 1 tablet (125 mcg total) by mouth daily before breakfast. 12/04/12 :1 qd EXCEPT 1/2 Tues & Th     losartan 50 MG tablet  Commonly known as:  COZAAR  Take 50 mg by mouth at bedtime.     metoprolol succinate 50 MG 24 hr tablet  Commonly known as:  TOPROL-XL  TAKE 1 TABLET BY MOUTH EVERY DAY     multivitamin with minerals Tabs tablet  Take 1 tablet by mouth daily. Antioxidant  Multi vitamin     nitroGLYCERIN 0.4 MG SL tablet  Commonly known as:  NITROSTAT  Place 1 tablet (0.4 mg total) under the tongue every 5 (five) minutes as needed for chest pain.     ONETOUCH DELICA LANCETS FINE Misc  Use as directed     pramipexole  0.5 MG tablet  Commonly known as:  MIRAPEX  Take 0.5 mg by mouth 2 (two) times daily.     sennosides-docusate sodium 8.6-50 MG tablet  Commonly known as:  SENOKOT-S  Take 1 tablet  by mouth at bedtime.     sitaGLIPtin-metformin 50-1000 MG per tablet  Commonly known as:  JANUMET  TAKE 1 TABLET BY MOUTH TWICE A DAY WITH 2 LARGEST MEALS.     traMADol 50 MG tablet  Commonly known as:  ULTRAM  TAKE 1 TABLET TWICE A DAY AS NEEDED FOR PAIN     travoprost (benzalkonium) 0.004 % ophthalmic solution  Commonly known as:  TRAVATAN  Place 1 drop into the right eye at bedtime.     trihexyphenidyl 2 MG tablet  Commonly known as:  ARTANE  Take 2 mg by mouth 2 (two) times daily with a meal.     VENTOLIN HFA 108 (90 BASE) MCG/ACT inhaler  Generic drug:  albuterol  INHALE 2 PUFFS INTO THE LUNGS EVERY 6 HOURS AS NEEDED FOR WHEEZING     vitamin B-12 1000 MCG tablet  Commonly known as:  CYANOCOBALAMIN  Take 1,000 mcg by mouth daily.         PAST MEDICAL HISTORY:   Past Medical History  Diagnosis Date  . Cardiomyopathy   . CAD (coronary artery disease)   . HTN (hypertension)     pt denies 08/19/12  . MI (myocardial infarction)   . Sleep apnea   . GERD (gastroesophageal reflux disease)   . Hyperthyroidism   . DM (diabetes mellitus)   . Hyperplasia, prostate   . Benign neoplasm of colon   . Nephrolithiasis   . Ventral hernia   . HLD (hyperlipidemia)   . Parkinson disease     1999  . OSA (obstructive sleep apnea)     AHI-28,on CPAP, noncompliant with CPAP  . Sleep apnea, organic   . Complication of anesthesia     pt states that he got a rash  . Arthritis   . Shortness of breath     Hx: of at all times  . UTI (lower urinary tract infection) 09/15/12    Klebsiella  . Asthma     PAST SURGICAL HISTORY:   Past Surgical History  Procedure Laterality Date  . Coronary artery bypass graft  2000    [name], MD  . Coronary stent placement  1998  . Median sternotomy  2000  .  Deep brain stimulator placement  2004    Right and left VIN stimulator placement   . Acoustic neuroma resection  1981  . Finger amputation      left pointer  . Cataract extraction w/ intraocular lens implant      Hx: of right eye  . Tonsillectomy    . Colonoscopy w/ biopsies and polypectomy      Hx: of  . Cardiac catheterization    . Subthalamic stimulator battery replacement N/A 09/05/2012    Procedure: Deep brain stimulator battery change;  Surgeon: Erline Levine, MD;  Location: [facility];  Service: Neurosurgery;  Laterality: N/A;  Deep brain stimulator battery change    SOCIAL HISTORY:   History   Social History  . Marital Status: Married    Spouse Name: [name]    Number of Children: 2  . Years of Education: N/A   Occupational History  . 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 Smoking status: Never Smoker   . Smokeless tobacco: Never Used  . Alcohol Use: Yes     Comment: occasional wine  . Drug Use: No  . Sexual Activity: Not on file   Other Topics Concern  . Not on file  Social History Narrative  . No narrative on file    FAMILY HISTORY:   Family Status  Relation Status Death Age  . Mother Deceased     complications of surgery  . Father Deceased     EtOHism  . Sister Alive     2 full, one half; PVD  . Brother Deceased     AIDS  . Child Alive     3, alive and well    ROS:  A complete 10 system review of systems was obtained and was unremarkable apart from what is mentioned above.  PHYSICAL EXAMINATION:    VITALS:   Filed Vitals:   02/19/13 1103  BP: 144/66  Pulse: 72  Resp: 20  Height: 5\' 8"  (1.727 m)  Weight: 273 lb 9 oz (124.087 kg)    GEN:  The patient appears stated age and is in NAD. HEENT:  Normocephalic, atraumatic.  The mucous membranes are moist. The superficial temporal arteries are without ropiness or tenderness. CV:  RRR Lungs:  CTAB Neck/HEME:  There are no carotid bruits  bilaterally.  Neurological examination:  Orientation: The patient is alert and oriented x3. Fund of knowledge is appropriate.  Recent and remote memory are intact.  Attention and concentration are normal.    Able to name objects and repeat phrases. Cranial nerves: There is good facial symmetry. Pupils are equal round and reactive to light bilaterally. Fundoscopic exam reveals clear margins bilaterally. Extraocular muscles are intact. The visual fields are full to confrontational testing. The speech is fluent and just mildly dysarthric.  He is hypophonic. Soft palate rises symmetrically and there is no tongue deviation. Hearing is intact to conversational tone. Motor: Strength is 5/5 in the bilateral upper and lower extremities.   Shoulder shrug is equal and symmetric.  There is no pronator drift. Deep tendon reflexes: Deep tendon reflexes are 0-1/4 at the bilateral biceps, triceps, brachioradialis, patella and achilles. Plantar responses are downgoing bilaterally.  Movement examination: Tone: There is normal tone in the UE and LE bilaterally Abnormal movements: There is no tremor noted today Coordination:  There is no decremation with RAM's today Gait and Station: The patient has minimal difficulty arising out of a deep-seated chair without the use of the hands. The patient's stride length is decreased and he shuffles just a little.      LABS  Lab Results  Component Value Date   WBC 7.4 12/04/2012   HGB 13.3 12/04/2012   HCT 40.5 12/04/2012   MCV 83.0 12/04/2012   PLT 161.0 12/04/2012   Lab Results  Component Value Date   TSH 0.25* 12/04/2012     Chemistry      Component Value Date/Time   NA 137 12/04/2012 1014   K 3.5 12/04/2012 1014   CL 98 12/04/2012 1014   CO2 31 12/04/2012 1014   BUN 18 12/04/2012 1014   CREATININE 0.9 12/04/2012 1014      Component Value Date/Time   CALCIUM 9.0 12/04/2012 1014   ALKPHOS 60 12/04/2012 1014   AST 15 12/04/2012 1014   ALT 18 12/04/2012  1014   BILITOT 0.5 12/04/2012 1014     Lab Results  Component Value Date   VITAMINB12 268 08/19/2012   Lab Results  Component Value Date   HGBA1C 7.6* 12/04/2012   DBS programming was performed today, which is described in more detail on a separate programming procedural notes.  In short, there was no significant change following programming.  ASSESSMENT/PLAN:  1.  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    -I am going to continue the artane for now since he feels he is doing much better on the medication   -He does not wish to go to the neuro rehabilitation center currently for the Parkinson's program  -Programmed the patient programmer so that he could go up or down by 0.2 V on each side.  He and his daughter were shown how to use this.  -I. talked to him about the value of levodopa.  It may help balance somewhat.  Ultimately, we decided not to change anything.  I did ask him to let me know if he experiences any falls.  In the future, I suspect that we will end up adding levodopa, and ultimately weaning Mirapex as he ages. 2.  RBD  -He will increase his clonazepam 0.5 mg, to 1-1/2 tablets per night. 3.  OSAS, noncompliant with CPAP.  -He follows with Dr. [name].  He understands morbidity and mortality associated with untreated sleep apnea.  Weight loss would be of value 4.  Sialorrhea  -This is associated with Parkinson's disease.  He may benefit from Myobloc in the future. 5.  Dysphagia.  -Overall, this is fairly mild but I will give an eye on this and consider a MBE if needed. 6.  I will see him back in 3-4 months, sooner should new neurologic issues arise.

## 2013-03-22 ENCOUNTER — Other Ambulatory Visit: Payer: Self-pay | Admitting: Internal Medicine

## 2013-04-01 ENCOUNTER — Other Ambulatory Visit: Payer: Self-pay

## 2013-04-01 MED ORDER — POTASSIUM CHLORIDE CRYS ER 10 MEQ PO TBCR
EXTENDED_RELEASE_TABLET | ORAL | Status: DC
Start: 2013-04-01 — End: 2013-04-14

## 2013-04-01 NOTE — Telephone Encounter (Signed)
Refilled rx for klor-con m10

## 2013-04-03 ENCOUNTER — Other Ambulatory Visit: Payer: Self-pay | Admitting: Endocrinology

## 2013-04-08 ENCOUNTER — Encounter: Payer: Self-pay | Admitting: Internal Medicine

## 2013-04-08 ENCOUNTER — Ambulatory Visit (INDEPENDENT_AMBULATORY_CARE_PROVIDER_SITE_OTHER): Payer: Federal, State, Local not specified - PPO | Admitting: Internal Medicine

## 2013-04-08 ENCOUNTER — Other Ambulatory Visit (INDEPENDENT_AMBULATORY_CARE_PROVIDER_SITE_OTHER): Payer: Federal, State, Local not specified - PPO

## 2013-04-08 ENCOUNTER — Other Ambulatory Visit: Payer: Self-pay | Admitting: Internal Medicine

## 2013-04-08 VITALS — BP 126/60 | HR 72 | Temp 97.7°F | Resp 15 | Wt 281.4 lb

## 2013-04-08 DIAGNOSIS — E890 Postprocedural hypothyroidism: Secondary | ICD-10-CM

## 2013-04-08 DIAGNOSIS — IMO0001 Reserved for inherently not codable concepts without codable children: Secondary | ICD-10-CM

## 2013-04-08 DIAGNOSIS — E1165 Type 2 diabetes mellitus with hyperglycemia: Secondary | ICD-10-CM

## 2013-04-08 DIAGNOSIS — E1159 Type 2 diabetes mellitus with other circulatory complications: Secondary | ICD-10-CM

## 2013-04-08 DIAGNOSIS — E785 Hyperlipidemia, unspecified: Secondary | ICD-10-CM

## 2013-04-08 DIAGNOSIS — I10 Essential (primary) hypertension: Secondary | ICD-10-CM | POA: Diagnosis not present

## 2013-04-08 LAB — HEPATIC FUNCTION PANEL
ALT: 34 U/L (ref 0–53)
AST: 18 U/L (ref 0–37)
Albumin: 3.9 g/dL (ref 3.5–5.2)
Alkaline Phosphatase: 59 U/L (ref 39–117)
Bilirubin, Direct: 0 mg/dL (ref 0.0–0.3)
Total Bilirubin: 0.7 mg/dL (ref 0.3–1.2)
Total Protein: 6.6 g/dL (ref 6.0–8.3)

## 2013-04-08 LAB — LIPID PANEL
CHOLESTEROL: 208 mg/dL — AB (ref 0–200)
HDL: 37.7 mg/dL — ABNORMAL LOW (ref >39.00)
LDL Cholesterol: 126 mg/dL — ABNORMAL HIGH (ref 0–99)
TRIGLYCERIDES: 220 mg/dL — AB (ref 0.0–149.0)
Total CHOL/HDL Ratio: 6
VLDL: 44 mg/dL — ABNORMAL HIGH (ref 0.0–40.0)

## 2013-04-08 LAB — BASIC METABOLIC PANEL
BUN: 17 mg/dL (ref 6–23)
CALCIUM: 8.8 mg/dL (ref 8.4–10.5)
CHLORIDE: 100 meq/L (ref 96–112)
CO2: 32 mEq/L (ref 19–32)
Creatinine, Ser: 1 mg/dL (ref 0.4–1.5)
GFR: 78.74 mL/min (ref >60.00)
Glucose, Bld: 183 mg/dL — ABNORMAL HIGH (ref 70–99)
Potassium: 4 mEq/L (ref 3.5–5.1)
Sodium: 139 mEq/L (ref 135–145)

## 2013-04-08 LAB — MICROALBUMIN / CREATININE URINE RATIO
Creatinine,U: 108.4 mg/dL
MICROALB UR: 0.4 mg/dL (ref 0.0–1.9)
Microalb Creat Ratio: 0.4 mg/g (ref 0.0–30.0)

## 2013-04-08 LAB — HEMOGLOBIN A1C: Hgb A1c MFr Bld: 8.3 % — ABNORMAL HIGH (ref 4.6–6.5)

## 2013-04-08 LAB — TSH: TSH: 4.29 u[IU]/mL (ref 0.35–5.50)

## 2013-04-08 LAB — CK: CK TOTAL: 122 U/L (ref 7–232)

## 2013-04-08 MED ORDER — LEVOTHYROXINE SODIUM 125 MCG PO TABS: ORAL_TABLET | ORAL | Status: DC

## 2013-04-08 NOTE — Assessment & Plan Note (Signed)
A1c & urine microalbumin  nutritional interventions  discussed.

## 2013-04-08 NOTE — Assessment & Plan Note (Signed)
BP controlled BMET

## 2013-04-08 NOTE — Progress Notes (Signed)
   Subjective:    Patient ID: Henry [name], male    DOB: June 23, 1950, 63 y.o.   MRN: [id number]  HPI HYPERTENSION: Disease Monitoring: Blood pressure range/ average :not taking at home Medication Compliance: yes  Diabetes : A1c 7.6%  12/04/12 FBS range/average: 180s at home Highest 2 hr post meal glucose: not monitored Medication compliance: yes Hypoglycemia: none Ophthamology care: within last 3 months Podiatry care: not within last 12 months  HYPERLIPIDEMIA: 02/28/12 LDL 51;TG 157;HDL 35.7 Disease Monitoring:  Medication Compliance: stopped taking Crestor 6-8 weeks ago d/t increased costs after insurance  Hypothyroidism : 12/04/12 TSH 0.25 Released by Dr Loanne Drilling to PCP follow up Taking 125 mcg per day; not following dosage prescribed in 11/14   Review of Systems  Chest pain, palpitations: no  Dyspnea: yes, worsening in last 3-4 months d/t weight gain over holidays/winter  Edema: none  Claudication: none  Lightheadedness,Syncope: none  Weight gain/loss: yes, +10 lbs  Polyuria/phagia/dipsia: none  Blurred vision /diplopia/lossof vision: infrequent with higher sugars, difficulty seeing score board at baseball games  Limb numbness/tingling/burning: none  Non healing skin lesions: none  Abd pain, bowel changes: none  Myalgias: weakness d/t parkinson's  Memory loss: questionable short recall ie recalling questions recently asked.      Objective:   Physical Exam  Gen.: central obesity. Alert, appropriate and cooperative throughout exam.  Head: Normocephalic without obvious abnormalities; no alopecia.  Eyes: No corneal or conjunctival inflammation noted. Pupils equal round reactive to light and accommodation. Extraocular motion intact. Vision grossly normal with lenses.  Nose: External nasal exam reveals no deformity or inflammation. Nasal mucosa are pink and moist. No lesions or exudates noted.  Mouth: Oral mucosa and oropharynx reveal 2-3 small ulcerations on tongue, ?  candidiasis noted on tongue. 2 chipped teeth.  Neck: No deformities, masses, or tenderness noted. Range of motion WNL.  Lungs: Normal respiratory effort; chest expands symmetrically. Lungs are clear to auscultation without rales, wheezes, or increased work of breathing.  Heart: Normal rate and rhythm. Normal S1 and S2. No gallop, click, or rub. No murmur.  Abdomen: Significant truncal girth. Bowel sounds normal; abdomen soft and nontender. No masses, organomegaly. Small umbilical hernia. Large ventral hernia Musculoskeletal/extremities: No deformity or scoliosis noted of the thoracic or lumbar spine. Parkinsonian shuffle gait.  No clubbing, cyanosis, edema, or significant extremity deformity noted. Range of motion normal . Tone & strength normal.  Hand joints reveal mild DJD changes; R index finger amputation PIP joint .  Fingernail / toenail health good.  Able to lie down & sit up w/o help. Classic low back crawl exhibited from supine to sitting.  Vascular: Carotid, radial artery, dorsalis pedis and posterior tibial pulses are full and equal. No bruits present.  Neurologic: Alert and oriented x3. Deep tendon reflexes symmetrical and normal.  Skin: Intact without suspicious lesions or rashes.  Lymph: No cervical, axillary lymphadenopathy present.  Psych: Mood and affect are normal. Normally interactive.       Assessment & Plan:  #1 T2DM - repeast A1C - Review meds after labs return.  BMP  #2 hyperlipidema - Lipids #3 hypothyroidism - recheck TSH and adjust synthroid accordingly; 1-5 goal  #4 dyspnea with exertion - CBC; review dietary/nutrition r/t recent weight gain  #5 elevated PSA - as per [facility] #6 Dry mouth , probably iatrogenic from Neuro meds but R/O candidiasis on tongue - recheck A1C - clomitrazole troche rinse?

## 2013-04-08 NOTE — Progress Notes (Signed)
Pre visit review using our clinic review tool, if applicable. No additional management support is needed unless otherwise documented below in the visit note. 

## 2013-04-08 NOTE — Assessment & Plan Note (Signed)
Lipids, LFTs,CK fasting

## 2013-04-08 NOTE — Patient Instructions (Addendum)
Your next office appointment will be determined based upon review of your pending labs . Those instructions will be transmitted to you  by mail. 

## 2013-04-08 NOTE — Progress Notes (Signed)
   Subjective:    Patient ID: Henry [name], male    DOB: [birth year], 63 y.o.   MRN: [id number]  HPI  Reports increased dyspnea with exertion has worsened in last 3-4 month; he is aware of weight gain of 10 pounds.  Also has blisters on tongue, tongue swelling x 3 weeks; having difficulty swallowing and salt foods irritate the areas. Reports increased dry mouth.   HPI HYPERTENSION:  Disease Monitoring:  Blood pressure range/ average: not taking at home Medication Compliance: yes FASTING HYPERGLYCEMIA OR Diabetes:  FBS range/average: 180s average Highest 2 hr post meal glucose: not taken Medication compliance: yes Hypoglycemia:none Ophthamology care: within last 3 months Podiatry care: not within last 12 months Heart healthy diet not followed; no exercise.  HYPERLIPIDEMIA:  Disease Monitoring:  Medication Compliance: stopped taking Crestor 6-8 weeks ago d/t increased cost after insurance.    Review of Systems Chest pain, palpitations: no Dyspnea: yes, worsening in last 3-4 months d/t weight gain over holidays/winter Edema: none Claudication: none Lightheadedness,Syncope: none Weight gain/loss: yes, +10 lbs Polyuria/phagia/dipsia: none Blurred vision /diplopia/lossof vision: infrequent with higher sugars, difficulty seeing score board at baseball games Limb numbness/tingling/burning: none Non healing skin lesions: none Abd pain, bowel changes: none Myalgias: weakness d/t parkinson's  Memory loss: questionable short recall ie recalling questions recently asked.      Objective:   Physical Exam Gen.: Healthy and well-nourished in appearance. Alert, appropriate and cooperative throughout exam.  Head: Normocephalic without obvious abnormalities; no alopecia.  Eyes: No corneal or conjunctival inflammation noted. Pupils equal round reactive to light and accommodation. Extraocular motion intact. Vision grossly normal with lenses. Nose: External nasal exam reveals no deformity or  inflammation. Nasal mucosa are pink and moist. No lesions or exudates noted.  Mouth: Oral mucosa and oropharynx reveal 2-3 small ulcerations on tongue, candidiasis noted on tongue. 2 chipped teeth.  Neck: No deformities, masses, or tenderness noted. Range of motion WNL.  Lungs: Normal respiratory effort; chest expands symmetrically. Lungs are clear to auscultation without rales, wheezes, or increased work of breathing.  Heart: Normal rate and rhythm. Normal S1 and S2. No gallop, click, or rub. No murmur.  Abdomen: Significant truncal girth. Bowel sounds normal; abdomen soft and nontender. No masses, organomegaly. Umbilical hernia.  Musculoskeletal/extremities: No deformity or scoliosis noted of the thoracic or lumbar spine. Parkinsonian shuffle gait.  No clubbing, cyanosis, edema, or significant extremity deformity noted. Range of motion normal .Tone & strength normal.  Hand joints reveal mild DJD changes; R index finger shortened to PIP joint d/t table saw injury 20 years ago.  Fingernail / toenail health good.  Able to lie down & sit up w/o help. Classic low back crawl exhibited from supine to sitting.  Vascular: Carotid, radial artery, dorsalis pedis and posterior tibial pulses are full and equal. No bruits present.  Neurologic: Alert and oriented x3. Deep tendon reflexes symmetrical and normal.  Skin: Intact without suspicious lesions or rashes.  Lymph: No cervical, axillary lymphadenopathy present.  Psych: Mood and affect are normal. Normally interactive.     Assessment & Plan:  #1 T2DM - repeast A1C - Review meds after labs return.  #2 hyperlipidema - Lipids, BMP #3 hypothyroidism - recheck TSH and adjust synthroid accordingly; 1-5 goal #4 dyspnea with exertion - CBC; review dietary/nutrition r/t recent weight gain #5 elevated PSA - repeat PSA, prostate exam #6 candidiasis on tongue - recheck A1C - clomitrazole troche rinse?

## 2013-04-08 NOTE — Assessment & Plan Note (Addendum)
Dose never decreased as directed in 11/2012 Recheck TSH ; goal = 1-5

## 2013-04-13 ENCOUNTER — Other Ambulatory Visit: Payer: Self-pay | Admitting: Neurology

## 2013-04-14 ENCOUNTER — Other Ambulatory Visit: Payer: Self-pay | Admitting: *Deleted

## 2013-04-14 ENCOUNTER — Other Ambulatory Visit: Payer: Self-pay | Admitting: Neurology

## 2013-04-14 MED ORDER — POTASSIUM CHLORIDE CRYS ER 10 MEQ PO TBCR: EXTENDED_RELEASE_TABLET | ORAL | Status: DC

## 2013-04-14 NOTE — Telephone Encounter (Signed)
Henry [name] is now prescribing this drug

## 2013-04-14 NOTE — Telephone Encounter (Signed)
Per last office note - patient to increase to 1.5 tablets at night. Okay to refill?

## 2013-04-14 NOTE — Telephone Encounter (Signed)
Rx sent to the pharmacy.//AB/CMA 

## 2013-04-14 NOTE — Telephone Encounter (Signed)
yes

## 2013-04-14 NOTE — Telephone Encounter (Signed)
Clonazepam .5 mg tablets called to [facility].

## 2013-04-16 NOTE — Telephone Encounter (Signed)
yes

## 2013-04-22 ENCOUNTER — Encounter: Payer: Self-pay | Admitting: Internal Medicine

## 2013-04-24 ENCOUNTER — Other Ambulatory Visit: Payer: Self-pay | Admitting: Internal Medicine

## 2013-04-28 ENCOUNTER — Encounter: Payer: Self-pay | Admitting: Internal Medicine

## 2013-05-06 ENCOUNTER — Other Ambulatory Visit: Payer: Self-pay | Admitting: Internal Medicine

## 2013-05-12 ENCOUNTER — Encounter (HOSPITAL_COMMUNITY): Payer: Self-pay | Admitting: Emergency Medicine

## 2013-05-12 ENCOUNTER — Emergency Department (HOSPITAL_COMMUNITY): Payer: Federal, State, Local not specified - PPO

## 2013-05-12 ENCOUNTER — Inpatient Hospital Stay (HOSPITAL_COMMUNITY)
Admission: EM | Admit: 2013-05-12 | Discharge: 2013-05-13 | DRG: 293 | Disposition: A | Discharge status: Home / Self Care | Payer: Federal, State, Local not specified - PPO | Attending: Cardiovascular Disease | Admitting: Cardiovascular Disease

## 2013-05-12 DIAGNOSIS — R0602 Shortness of breath: Secondary | ICD-10-CM | POA: Diagnosis not present

## 2013-05-12 DIAGNOSIS — R0989 Other specified symptoms and signs involving the circulatory and respiratory systems: Secondary | ICD-10-CM | POA: Diagnosis not present

## 2013-05-12 DIAGNOSIS — E785 Hyperlipidemia, unspecified: Secondary | ICD-10-CM | POA: Diagnosis present

## 2013-05-12 DIAGNOSIS — G4733 Obstructive sleep apnea (adult) (pediatric): Secondary | ICD-10-CM | POA: Diagnosis not present

## 2013-05-12 DIAGNOSIS — I2510 Atherosclerotic heart disease of native coronary artery without angina pectoris: Secondary | ICD-10-CM | POA: Diagnosis not present

## 2013-05-12 DIAGNOSIS — G20 Parkinson's disease: Secondary | ICD-10-CM | POA: Diagnosis present

## 2013-05-12 DIAGNOSIS — R079 Chest pain, unspecified: Secondary | ICD-10-CM | POA: Diagnosis not present

## 2013-05-12 DIAGNOSIS — Z6839 Body mass index (BMI) 39.0-39.9, adult: Secondary | ICD-10-CM | POA: Diagnosis not present

## 2013-05-12 DIAGNOSIS — I509 Heart failure, unspecified: Secondary | ICD-10-CM | POA: Diagnosis present

## 2013-05-12 DIAGNOSIS — I2589 Other forms of chronic ischemic heart disease: Secondary | ICD-10-CM | POA: Diagnosis present

## 2013-05-12 DIAGNOSIS — Z9119 Patient's noncompliance with other medical treatment and regimen: Secondary | ICD-10-CM | POA: Diagnosis not present

## 2013-05-12 DIAGNOSIS — K219 Gastro-esophageal reflux disease without esophagitis: Secondary | ICD-10-CM | POA: Diagnosis present

## 2013-05-12 DIAGNOSIS — R0789 Other chest pain: Secondary | ICD-10-CM | POA: Diagnosis not present

## 2013-05-12 DIAGNOSIS — E119 Type 2 diabetes mellitus without complications: Secondary | ICD-10-CM

## 2013-05-12 DIAGNOSIS — I5043 Acute on chronic combined systolic (congestive) and diastolic (congestive) heart failure: Principal | ICD-10-CM | POA: Diagnosis present

## 2013-05-12 DIAGNOSIS — S68118A Complete traumatic metacarpophalangeal amputation of other finger, initial encounter: Secondary | ICD-10-CM | POA: Diagnosis not present

## 2013-05-12 DIAGNOSIS — Z9861 Coronary angioplasty status: Secondary | ICD-10-CM

## 2013-05-12 DIAGNOSIS — E1159 Type 2 diabetes mellitus with other circulatory complications: Secondary | ICD-10-CM | POA: Diagnosis present

## 2013-05-12 DIAGNOSIS — Z9849 Cataract extraction status, unspecified eye: Secondary | ICD-10-CM | POA: Diagnosis not present

## 2013-05-12 DIAGNOSIS — Z91199 Patient's noncompliance with other medical treatment and regimen due to unspecified reason: Secondary | ICD-10-CM

## 2013-05-12 DIAGNOSIS — J45909 Unspecified asthma, uncomplicated: Secondary | ICD-10-CM | POA: Diagnosis present

## 2013-05-12 DIAGNOSIS — Z794 Long term (current) use of insulin: Secondary | ICD-10-CM

## 2013-05-12 DIAGNOSIS — I798 Other disorders of arteries, arterioles and capillaries in diseases classified elsewhere: Secondary | ICD-10-CM | POA: Diagnosis present

## 2013-05-12 DIAGNOSIS — Z9989 Dependence on other enabling machines and devices: Secondary | ICD-10-CM

## 2013-05-12 DIAGNOSIS — E1122 Type 2 diabetes mellitus with diabetic chronic kidney disease: Secondary | ICD-10-CM | POA: Diagnosis present

## 2013-05-12 DIAGNOSIS — Z7982 Long term (current) use of aspirin: Secondary | ICD-10-CM | POA: Diagnosis not present

## 2013-05-12 DIAGNOSIS — I10 Essential (primary) hypertension: Secondary | ICD-10-CM | POA: Diagnosis not present

## 2013-05-12 DIAGNOSIS — I255 Ischemic cardiomyopathy: Secondary | ICD-10-CM

## 2013-05-12 DIAGNOSIS — Z79899 Other long term (current) drug therapy: Secondary | ICD-10-CM

## 2013-05-12 DIAGNOSIS — E0590 Thyrotoxicosis, unspecified without thyrotoxic crisis or storm: Secondary | ICD-10-CM | POA: Diagnosis present

## 2013-05-12 DIAGNOSIS — R0609 Other forms of dyspnea: Secondary | ICD-10-CM | POA: Diagnosis not present

## 2013-05-12 DIAGNOSIS — Z951 Presence of aortocoronary bypass graft: Secondary | ICD-10-CM | POA: Diagnosis not present

## 2013-05-12 DIAGNOSIS — G20A1 Parkinson's disease without dyskinesia, without mention of fluctuations: Secondary | ICD-10-CM | POA: Diagnosis present

## 2013-05-12 DIAGNOSIS — IMO0001 Reserved for inherently not codable concepts without codable children: Secondary | ICD-10-CM | POA: Diagnosis present

## 2013-05-12 DIAGNOSIS — I517 Cardiomegaly: Secondary | ICD-10-CM | POA: Diagnosis not present

## 2013-05-12 LAB — CBC
HCT: 40.2 % (ref 39.0–52.0)
HCT: 40.7 % (ref 39.0–52.0)
Hemoglobin: 13.1 g/dL (ref 13.0–17.0)
Hemoglobin: 13.4 g/dL (ref 13.0–17.0)
MCH: 27.6 pg (ref 26.0–34.0)
MCH: 27.8 pg (ref 26.0–34.0)
MCHC: 32.6 g/dL (ref 30.0–36.0)
MCHC: 32.9 g/dL (ref 30.0–36.0)
MCV: 84.6 fL (ref 78.0–100.0)
MCV: 84.4 fL (ref 78.0–100.0)
PLATELETS: 153 10*3/uL (ref 150–400)
Platelets: 151 10*3/uL (ref 150–400)
RBC: 4.75 MIL/uL (ref 4.22–5.81)
RBC: 4.82 MIL/uL (ref 4.22–5.81)
RDW: 14.3 % (ref 11.5–15.5)
RDW: 14.5 % (ref 11.5–15.5)
WBC: 6.2 10*3/uL (ref 4.0–10.5)
WBC: 6.6 10*3/uL (ref 4.0–10.5)

## 2013-05-12 LAB — CREATININE, SERUM
Creatinine, Ser: 0.9 mg/dL (ref 0.50–1.35)
GFR calc Af Amer: >90 mL/min (ref >90)
GFR calc non Af Amer: >90 mL/min (ref >90)

## 2013-05-12 LAB — BASIC METABOLIC PANEL
BUN: 13 mg/dL (ref 6–23)
CO2: 29 meq/L (ref 19–32)
Calcium: 8.9 mg/dL (ref 8.4–10.5)
Chloride: 100 mEq/L (ref 96–112)
Creatinine, Ser: 0.82 mg/dL (ref 0.50–1.35)
GFR calc Af Amer: >90 mL/min (ref >90)
GFR calc non Af Amer: >90 mL/min (ref >90)
Glucose, Bld: 186 mg/dL — ABNORMAL HIGH (ref 70–99)
POTASSIUM: 4.3 meq/L (ref 3.7–5.3)
SODIUM: 141 meq/L (ref 137–147)

## 2013-05-12 LAB — PRO B NATRIURETIC PEPTIDE: PRO B NATRI PEPTIDE: 47.7 pg/mL (ref 0–125)

## 2013-05-12 LAB — I-STAT TROPONIN, ED
Troponin i, poc: 0 ng/mL (ref 0.00–0.08)
Troponin i, poc: 0.01 ng/mL (ref 0.00–0.08)

## 2013-05-12 LAB — D-DIMER, QUANTITATIVE: D-Dimer, Quant: 0.33 ug/mL-FEU (ref 0.00–0.48)

## 2013-05-12 LAB — GLUCOSE, CAPILLARY: GLUCOSE-CAPILLARY: 127 mg/dL — AB (ref 70–99)

## 2013-05-12 LAB — TROPONIN I: Troponin I: <0.3 ng/mL (ref <0.30)

## 2013-05-12 MED ORDER — LOSARTAN POTASSIUM 50 MG PO TABS
50.0000 mg | ORAL_TABLET | Freq: Every day | ORAL | Status: DC
  Administered 2013-05-12: 50 mg via ORAL
  Filled 2013-05-12 (×2): qty 1

## 2013-05-12 MED ORDER — ASPIRIN 81 MG PO CHEW
324.0000 mg | CHEWABLE_TABLET | Freq: Once | ORAL | Status: AC
  Administered 2013-05-12: 324 mg via ORAL
  Filled 2013-05-12: qty 4

## 2013-05-12 MED ORDER — ASPIRIN EC 81 MG PO TBEC
81.0000 mg | DELAYED_RELEASE_TABLET | Freq: Every day | ORAL | Status: DC
  Administered 2013-05-13: 81 mg via ORAL
  Filled 2013-05-12: qty 1

## 2013-05-12 MED ORDER — SODIUM CHLORIDE 0.9 % IJ SOLN
3.0000 mL | Freq: Two times a day (BID) | INTRAMUSCULAR | Status: DC
  Administered 2013-05-12 – 2013-05-13 (×2): 3 mL via INTRAVENOUS

## 2013-05-12 MED ORDER — FUROSEMIDE 10 MG/ML IJ SOLN
80.0000 mg | Freq: Two times a day (BID) | INTRAMUSCULAR | Status: DC
  Administered 2013-05-12 – 2013-05-13 (×3): 80 mg via INTRAVENOUS
  Filled 2013-05-12 (×4): qty 8

## 2013-05-12 MED ORDER — CLONAZEPAM 0.5 MG PO TABS
0.7500 mg | ORAL_TABLET | Freq: Every day | ORAL | Status: DC
  Administered 2013-05-12: 0.75 mg via ORAL
  Filled 2013-05-12: qty 2

## 2013-05-12 MED ORDER — METFORMIN HCL 500 MG PO TABS
1000.0000 mg | ORAL_TABLET | Freq: Two times a day (BID) | ORAL | Status: DC
  Administered 2013-05-12 – 2013-05-13 (×2): 1000 mg via ORAL
  Filled 2013-05-12 (×4): qty 2

## 2013-05-12 MED ORDER — GLIMEPIRIDE 1 MG PO TABS: 1.0000 mg | ORAL_TABLET | Freq: Two times a day (BID) | ORAL | Status: DC

## 2013-05-12 MED ORDER — LEVOTHYROXINE SODIUM 125 MCG PO TABS
125.0000 ug | ORAL_TABLET | Freq: Every day | ORAL | Status: DC
  Administered 2013-05-13: 125 ug via ORAL
  Filled 2013-05-12 (×2): qty 1

## 2013-05-12 MED ORDER — CLONAZEPAM 0.5 MG PO TABS: 0.7500 mg | ORAL_TABLET | Freq: Every day | ORAL | Status: DC

## 2013-05-12 MED ORDER — NITROGLYCERIN 0.4 MG SL SUBL
0.4000 mg | SUBLINGUAL_TABLET | SUBLINGUAL | Status: DC | PRN
  Administered 2013-05-12: 0.4 mg via SUBLINGUAL
  Filled 2013-05-12: qty 1

## 2013-05-12 MED ORDER — LINAGLIPTIN 5 MG PO TABS
5.0000 mg | ORAL_TABLET | Freq: Every day | ORAL | Status: DC
  Administered 2013-05-13: 5 mg via ORAL
  Filled 2013-05-12 (×2): qty 1

## 2013-05-12 MED ORDER — NITROGLYCERIN 0.4 MG SL SUBL: 0.4000 mg | SUBLINGUAL_TABLET | SUBLINGUAL | Status: DC | PRN

## 2013-05-12 MED ORDER — ADULT MULTIVITAMIN W/MINERALS CH
1.0000 | ORAL_TABLET | Freq: Every day | ORAL | Status: DC
  Administered 2013-05-13: 1 via ORAL
  Filled 2013-05-12: qty 1

## 2013-05-12 MED ORDER — ASPIRIN EC 81 MG PO TBEC: 81.0000 mg | DELAYED_RELEASE_TABLET | Freq: Every day | ORAL | Status: DC

## 2013-05-12 MED ORDER — SODIUM CHLORIDE 0.9 % IJ SOLN: 3.0000 mL | INTRAMUSCULAR | Status: DC | PRN

## 2013-05-12 MED ORDER — POTASSIUM CHLORIDE ER 10 MEQ PO TBCR
30.0000 meq | EXTENDED_RELEASE_TABLET | Freq: Every day | ORAL | Status: DC
  Administered 2013-05-12: 30 meq via ORAL
  Filled 2013-05-12 (×2): qty 3

## 2013-05-12 MED ORDER — HEPARIN SODIUM (PORCINE) 5000 UNIT/ML IJ SOLN
5000.0000 [IU] | Freq: Three times a day (TID) | INTRAMUSCULAR | Status: DC
  Administered 2013-05-12 – 2013-05-13 (×4): 5000 [IU] via SUBCUTANEOUS
  Filled 2013-05-12 (×4): qty 1

## 2013-05-12 MED ORDER — GLIMEPIRIDE 1 MG PO TABS
1.0000 mg | ORAL_TABLET | Freq: Every day | ORAL | Status: DC
  Filled 2013-05-12 (×2): qty 1

## 2013-05-12 MED ORDER — METOPROLOL SUCCINATE ER 50 MG PO TB24
50.0000 mg | ORAL_TABLET | Freq: Every day | ORAL | Status: DC
  Administered 2013-05-12: 50 mg via ORAL
  Filled 2013-05-12 (×2): qty 1

## 2013-05-12 MED ORDER — TRIHEXYPHENIDYL HCL 2 MG PO TABS
2.0000 mg | ORAL_TABLET | Freq: Two times a day (BID) | ORAL | Status: DC
  Administered 2013-05-12 – 2013-05-13 (×2): 2 mg via ORAL
  Filled 2013-05-12 (×3): qty 1

## 2013-05-12 MED ORDER — GLIMEPIRIDE 2 MG PO TABS
2.0000 mg | ORAL_TABLET | Freq: Every day | ORAL | Status: DC
  Administered 2013-05-13: 2 mg via ORAL
  Filled 2013-05-12 (×2): qty 1

## 2013-05-12 MED ORDER — TRAVOPROST 0.004 % OP SOLN
1.0000 [drp] | Freq: Every day | OPHTHALMIC | Status: DC
  Administered 2013-05-12: 1 [drp] via OPHTHALMIC
  Filled 2013-05-12: qty 2.5

## 2013-05-12 MED ORDER — SITAGLIPTIN-METFORMIN HCL 50-1000 MG PO TABS
1.0000 | ORAL_TABLET | Freq: Two times a day (BID) | ORAL | Status: DC
Start: 2013-05-12 — End: 2013-05-12

## 2013-05-12 MED ORDER — VITAMIN B-12 1000 MCG PO TABS
1000.0000 ug | ORAL_TABLET | Freq: Every day | ORAL | Status: DC
  Administered 2013-05-13: 1000 ug via ORAL
  Filled 2013-05-12: qty 1

## 2013-05-12 MED ORDER — TRAMADOL HCL 50 MG PO TABS
50.0000 mg | ORAL_TABLET | Freq: Two times a day (BID) | ORAL | Status: DC | PRN
  Administered 2013-05-12: 50 mg via ORAL
  Filled 2013-05-12: qty 1

## 2013-05-12 MED ORDER — PRAMIPEXOLE DIHYDROCHLORIDE 0.25 MG PO TABS
0.5000 mg | ORAL_TABLET | Freq: Two times a day (BID) | ORAL | Status: DC
  Administered 2013-05-12 – 2013-05-13 (×2): 0.5 mg via ORAL
  Filled 2013-05-12 (×3): qty 2

## 2013-05-12 MED ORDER — SODIUM CHLORIDE 0.9 % IV SOLN: 250.0000 mL | INTRAVENOUS | Status: DC | PRN

## 2013-05-12 NOTE — ED Notes (Signed)
Pt reports chest pain and sob, onset after waking from a sleep in the recliner, sts feels like a stiffness.

## 2013-05-12 NOTE — Progress Notes (Signed)
UR completed Gibran Veselka K. Averi Cacioppo, RN, BSN, [place], CCM  05/12/2013 3:32 PM

## 2013-05-12 NOTE — ED Provider Notes (Signed)
CSN: [id number]     Arrival date & time 05/12/13  0735 History   First MD Initiated Contact with Patient 05/12/13 [phone]     Chief Complaint  Patient presents with  . Chest Pain  . Shortness of Breath     (Consider location/radiation/quality/duration/timing/severity/associated sxs/prior Treatment) HPI Comments: 63 year old male with lipids, cardiomyopathy, ischemic heart disease, CAD, sleep apnea on CPAP, Parkinson's, dyspnea history presents after episode of chest pain and shortness of breath. Patient reports was mostly shortness of breath with mild chest pressure initially nonradiating. Patient felt as exertional once he tried to get out of bed and walk around. This is more significant and different than his previous cardiac symptoms that were not isolated shortness of breath. Patient feels mild recent orthopnea. No CHF history. Patient recently visited the beach and plenty of food with unknown salt intake. Mild weight gain recently. Maryanna Shape cardiology.  Patient is a 63 y.o. male presenting with chest pain and shortness of breath. The history is provided by the patient.  Chest Pain Associated symptoms: shortness of breath   Associated symptoms: no abdominal pain, no back pain, no cough, no fever, no headache, no nausea and not vomiting   Shortness of Breath Associated symptoms: chest pain   Associated symptoms: no abdominal pain, no cough, no fever, no headaches, no neck pain, no rash and no vomiting     Past Medical History  Diagnosis Date  . Cardiomyopathy   . CAD (coronary artery disease)   . HTN (hypertension)     pt denies 08/19/12  . MI (myocardial infarction)   . Sleep apnea   . GERD (gastroesophageal reflux disease)   . Hyperthyroidism   . DM (diabetes mellitus)   . Hyperplasia, prostate   . Benign neoplasm of colon   . Nephrolithiasis   . Ventral hernia   . HLD (hyperlipidemia)   . Parkinson disease     1999  . OSA (obstructive sleep apnea)     AHI-28,on CPAP,  noncompliant with CPAP  . Sleep apnea, organic   . Complication of anesthesia     pt states that he got a rash  . Arthritis   . Shortness of breath     Hx: of at all times  . UTI (lower urinary tract infection) 09/15/12    Klebsiella  . Asthma    Past Surgical History  Procedure Laterality Date  . Coronary artery bypass graft  2000    [name], MD  . Coronary stent placement  1998  . Median sternotomy  2000  . Deep brain stimulator placement  2004    Right and left VIN stimulator placement   . Acoustic neuroma resection  1981  . Finger amputation      left pointer  . Cataract extraction w/ intraocular lens implant      Hx: of right eye  . Tonsillectomy    . Colonoscopy w/ biopsies and polypectomy      Hx: of  . Cardiac catheterization    . Subthalamic stimulator battery replacement N/A 09/05/2012    Procedure: Deep brain stimulator battery change;  Surgeon: Erline Levine, MD;  Location: [facility];  Service: Neurosurgery;  Laterality: N/A;  Deep brain stimulator battery change   Family History  Problem Relation Age of Onset  . Peripheral vascular disease    . Arthritis    . Diabetes    . Alcoholism Mother 45    deceased   . Aneurysm Father 76  . Autoimmune disease  Brother 72    AIDS   History  Substance Use Topics  . Smoking status: Never Smoker   . Smokeless tobacco: Never Used  . Alcohol Use: Yes     Comment: occasional wine    Review of Systems  Constitutional: Negative for fever, chills and appetite change.  HENT: Negative for congestion.   Eyes: Negative for visual disturbance.  Respiratory: Positive for shortness of breath. Negative for cough.   Cardiovascular: Positive for chest pain.  Gastrointestinal: Negative for nausea, vomiting and abdominal pain.  Genitourinary: Negative for dysuria and flank pain.  Musculoskeletal: Negative for back pain, neck pain and neck stiffness.  Skin: Negative for rash.  Neurological: Negative for light-headedness  and headaches.      Allergies  Penicillins  Home Medications   Prior to Admission medications   Medication Sig Start Date End Date Taking? Authorizing Provider  aspirin EC 81 MG tablet Take 81 mg by mouth daily.    Historical Provider, MD  clonazePAM (KLONOPIN) 0.5 MG tablet Take 1.5 tablets (0.75 mg total) by mouth at bedtime. 04/14/13   Eustace Quail Tat, DO  furosemide (LASIX) 80 MG tablet TAKE AS DIRECTED (......Marland KitchenNEED TO BE SEEN IN OFFICE.........) 04/24/13   Hendricks Limes, MD  glimepiride (AMARYL) 2 MG tablet TAKE 1 TABLET BY MOUTH IN THE MORNING AND 1/2 TABLET AT NIGHT 09/21/12   Hendricks Limes, MD  glucose blood (ONE TOUCH ULTRA TEST) test strip Check blood sugar once a day 12/19/12   Rosalita Chessman, DO  ibuprofen (ADVIL,MOTRIN) 200 MG tablet Take 800 mg by mouth daily as needed for pain.    Historical Provider, MD  JANUMET 50-1000 MG per tablet TAKE 1 TABLET TWICE A DAY WITH LARGEST MEALS    Hendricks Limes, MD  levothyroxine (SYNTHROID, LEVOTHROID) 125 MCG tablet TAKE 1 TABLET EVERY DAY 04/08/13   Hendricks Limes, MD  losartan (COZAAR) 50 MG tablet Take 50 mg by mouth at bedtime.    Historical Provider, MD  metoprolol succinate (TOPROL-XL) 50 MG 24 hr tablet TAKE 1 TABLET BY MOUTH EVERY DAY 12/21/12   Lelon Perla, MD  Multiple Vitamin (MULTIVITAMIN WITH MINERALS) TABS tablet Take 1 tablet by mouth daily. Antioxidant  Multi vitamin    Historical Provider, MD  nitroGLYCERIN (NITROSTAT) 0.4 MG SL tablet Place 1 tablet (0.4 mg total) under the tongue every 5 (five) minutes as needed for chest pain. 09/06/11 04/08/13  Lelon Perla, MD  [facility] DELICA LANCETS FINE MISC Use as directed 12/19/12   Rosalita Chessman, DO  potassium chloride (KLOR-CON M10) 10 MEQ tablet TAKE 3 TABLETS BY MOUTH DAILY 04/14/13   Hendricks Limes, MD  traMADol (ULTRAM) 50 MG tablet TAKE 1 TABLET TWICE A DAY AS NEEDED FOR PAIN 02/11/13   Hendricks Limes, MD  travoprost, benzalkonium, (TRAVATAN) 0.004 %  ophthalmic solution Place 1 drop into the right eye at bedtime.     Historical Provider, MD  trihexyphenidyl (ARTANE) 2 MG tablet Take 2 mg by mouth 2 (two) times daily with a meal.    Historical Provider, MD  vitamin B-12 (CYANOCOBALAMIN) 1000 MCG tablet Take 1,000 mcg by mouth daily.    Historical Provider, MD   BP 123/71  Pulse 70  Temp(Src) 98.1 F (36.7 C) (Oral)  Resp 22  Ht 5\' 8"  (1.727 m)  Wt 270 lb (122.471 kg)  BMI 41.06 kg/m2  SpO2 95% Physical Exam  Nursing note and vitals reviewed. Constitutional: He is oriented to  person, place, and time. He appears well-developed and well-nourished.  HENT:  Head: Normocephalic and atraumatic.  Eyes: Conjunctivae are normal. Right eye exhibits no discharge. Left eye exhibits no discharge.  Neck: Normal range of motion. Neck supple. No tracheal deviation present.  Cardiovascular: Normal rate and regular rhythm.   Pulmonary/Chest: Effort normal and breath sounds normal.  Abdominal: Soft. He exhibits no distension. There is no tenderness ( obese). There is no guarding.  Musculoskeletal: He exhibits edema (mild bilateral lower extremity). He exhibits no tenderness.  Neurological: He is alert and oriented to person, place, and time.  Skin: Skin is warm. No rash noted.  Psychiatric: He has a normal mood and affect.    ED Course  Procedures (including critical care time) Labs Review Labs Reviewed  BASIC METABOLIC PANEL - Abnormal; Notable for the following:    Glucose, Bld 186 (*)    All other components within normal limits  CBC  PRO B NATRIURETIC PEPTIDE  D-DIMER, QUANTITATIVE  TROPONIN I  CBC  CREATININE, SERUM  TROPONIN I  TROPONIN I  I-STAT TROPOININ, ED  Randolm Idol, ED    Imaging Review Dg Chest Port 1 View  05/12/2013   CLINICAL DATA:  Chest pain.  Short of breath.  EXAM: PORTABLE CHEST - 1 VIEW  COMPARISON:  DG CHEST 2 VIEW dated 09/14/2012  FINDINGS: Power pack projects over the right chest. Median sternotomy/  CABG. Mild apical lordotic projection accentuates the cardiopericardial silhouette. There is no failure. No pleural effusion. No pulmonary edema.  IMPRESSION: Apical lordotic projection. CABG. No acute cardiopulmonary disease.   Electronically Signed   By: Dereck Ligas M.D.   On: 05/12/2013 07:58     EKG Interpretation   Date/Time:  Monday May 12 2013 07:41:11 EDT Ventricular Rate:  72 PR Interval:  182 QRS Duration: 82 QT Interval:  376 QTC Calculation: 411 R Axis:   94 Text Interpretation:  Normal sinus rhythm Rightward axis Possible Anterior  infarct , age undetermined T wave abnormality, consider lateral ischemia  Abnormal ECG Similar to previous diffuse T wave inversions Confirmed by  Yuuki Skeens  MD, Kirsten Mckone ([id number]) on 05/12/2013 8:16:07 AM      MDM   Final diagnoses:  Acute on chronic combined systolic and diastolic heart failure, NYHA class 4  CAD (coronary artery disease)  Hx of CABG  Obstructive sleep apnea  Ischemic cardiomyopathy  Hypertension   With cardiac history and exertional symptoms concern for cardiac etiology such as CHF, NSTEMI. EKG diffuse T wave inversions similar to previous. Patient had aspirin today. Patient denies classic PE risk factors however with exertional shortness breath different than cardiac history d-dimer ordered.  D-dimer negative no indication for CT angina. Symptoms improved on recheck and ED. Chest x-ray reviewed no acute findings. Cardiology evaluated in ER and admitted the patient.   Mariea Clonts, MD 05/12/13 [phone]

## 2013-05-12 NOTE — H&P (Signed)
Chief Complaint:  Acute heart failure exacerbation  HPI:  This is a 63 y.o. male with a past medical history significant for CAD status post CABG (2000), systolic heart failure secondary to ischemic cardiomyopathy (EF 35-40%) , obesity, diabetes mellitus, systemic hypertension, hyperlipidemia, obstructive sleep apnea presents with complaints of chest discomfort, dyspnea, orthopnea, abdominal bloating and ankle swelling. His weight at his primary care physician's office was roughly 10 pounds higher than his usual. He has just returned from a trip to the beach. He ate out a lot. He was also noncompliant with his usual diuretic, since he had no place to urinate on his fishing trips. His abdomen has swollen to the point that the shorts he wore on his trip can no longer be buttoned up.  His most recent nuclear stress test from 2013 shows an old large anteroapical scar with no significant ischemia and an ejection fraction of 41%. The 2013 echocardiogram shows congruent findings with anteroseptal and apical hypokinesis and an ejection fraction of 35-40%. Also documents pseudo-normal filling, elevated filling pressures.  His chest discomfort is a relatively mild complaint compared to the shortness of breath. When he tries to lay down he starts gasping for breath and cannot sleep.  He has also been noncompliant with CPAP therapy because he feels claustrophobic.  She also has Parkinson's disease and has a deep brain stimulator implanted in the right subclavian area. This is not a pacemaker or defibrillator.  Note that his pro BNP level is actually quite low at 47.7. He may be one of the typical obese patients where the BNP does not rise significantly. His chest x-ray does not show pulmonary edema.  PMHx:  Past Medical History  Diagnosis Date  . Cardiomyopathy   . CAD (coronary artery disease)   . HTN (hypertension)     pt denies 08/19/12  . MI (myocardial infarction)   . Sleep apnea   . GERD  (gastroesophageal reflux disease)   . Hyperthyroidism   . DM (diabetes mellitus)   . Hyperplasia, prostate   . Benign neoplasm of colon   . Nephrolithiasis   . Ventral hernia   . HLD (hyperlipidemia)   . Parkinson disease     1999  . OSA (obstructive sleep apnea)     AHI-28,on CPAP, noncompliant with CPAP  . Sleep apnea, organic   . Complication of anesthesia     pt states that he got a rash  . Arthritis   . Shortness of breath     Hx: of at all times  . UTI (lower urinary tract infection) 09/15/12    Klebsiella  . Asthma     Past Surgical History  Procedure Laterality Date  . Coronary artery bypass graft  2000    Tressie Stalker, MD  . Coronary stent placement  1998  . Median sternotomy  2000  . Deep brain stimulator placement  2004    Right and left VIN stimulator placement   . Acoustic neuroma resection  1981  . Finger amputation      left pointer  . Cataract extraction w/ intraocular lens implant      Hx: of right eye  . Tonsillectomy    . Colonoscopy w/ biopsies and polypectomy      Hx: of  . Cardiac catheterization    . Subthalamic stimulator battery replacement N/A 09/05/2012    Procedure: Deep brain stimulator battery change;  Surgeon: Maeola Harman, MD;  Location: [facility];  Service: Neurosurgery;  Laterality: N/A;  Deep  brain stimulator battery change    FAMHx:  Family History  Problem Relation Age of Onset  . Peripheral vascular disease    . Arthritis    . Diabetes    . Alcoholism Mother 24    deceased   . Aneurysm Father 48  . Autoimmune disease Brother 37    AIDS    SOCHx:   reports that he has never smoked. He has never used smokeless tobacco. He reports that he drinks alcohol. He reports that he does not use illicit drugs.  ALLERGIES:  Allergies  Allergen Reactions  . Penicillins Rash    Because of a history of documented adverse serious drug reaction;Medi Alert bracelet  is recommended    ROS: Constitutional: positive for fatigue,  negative for chills, fevers and night sweats Eyes: negative Ears, nose, mouth, throat, and face: positive for nasal congestion Respiratory: positive for dyspnea on exertion and wheezing, negative for cough, hemoptysis and sputum Cardiovascular: positive for chest pressure/discomfort, dyspnea, fatigue, orthopnea and paroxysmal nocturnal dyspnea, negative for irregular heart beat, palpitations and syncope Gastrointestinal: positive for Abdominal bloating, negative for constipation, diarrhea, jaundice, melena, nausea and vomiting Genitourinary:positive for frequency, hesitancy and nocturia Integument/breast: negative for rash Hematologic/lymphatic: negative for bleeding, easy bruising and petechiae Musculoskeletal:positive for arthralgias and back pain Neurological: negative Behavioral/Psych: negative Endocrine: positive for diabetic symptoms including polyuria Allergic/Immunologic: negative  HOME MEDS:  (Not in a hospital admission)  LABS/IMAGING: Results for orders placed during the hospital encounter of 05/12/13 (from the past 48 hour(s))  CBC     Status: None   Collection Time    05/12/13  8:08 AM      Result Value Ref Range   WBC 6.2  4.0 - 10.5 K/uL   RBC 4.75  4.22 - 5.81 MIL/uL   Hemoglobin 13.1  13.0 - 17.0 g/dL   HCT 40.2  39.0 - 52.0 %   MCV 84.6  78.0 - 100.0 fL   MCH 27.6  26.0 - 34.0 pg   MCHC 32.6  30.0 - 36.0 g/dL   RDW 14.3  11.5 - 15.5 %   Platelets 151  150 - 400 K/uL  BASIC METABOLIC PANEL     Status: Abnormal   Collection Time    05/12/13  8:08 AM      Result Value Ref Range   Sodium 141  137 - 147 mEq/L   Potassium 4.3  3.7 - 5.3 mEq/L   Chloride 100  96 - 112 mEq/L   CO2 29  19 - 32 mEq/L   Glucose, Bld 186 (*) 70 - 99 mg/dL   BUN 13  6 - 23 mg/dL   Creatinine, Ser 0.82  0.50 - 1.35 mg/dL   Calcium 8.9  8.4 - 10.5 mg/dL   GFR calc non Af Amer >90  >90 mL/min   GFR calc Af Amer >90  >90 mL/min   Comment: (NOTE)     The eGFR has been calculated using  the CKD EPI equation.     This calculation has not been validated in all clinical situations.     eGFR's persistently <90 mL/min signify possible Chronic Kidney     Disease.  PRO B NATRIURETIC PEPTIDE     Status: None   Collection Time    05/12/13  8:08 AM      Result Value Ref Range   Pro B Natriuretic peptide (BNP) 47.7  0 - 125 pg/mL  D-DIMER, QUANTITATIVE     Status: None   Collection  Time    05/12/13  8:08 AM      Result Value Ref Range   D-Dimer, Quant 0.33  0.00 - 0.48 ug/mL-FEU   Comment:            AT THE INHOUSE ESTABLISHED CUTOFF     VALUE OF 0.48 ug/mL FEU,     THIS ASSAY HAS BEEN DOCUMENTED     IN THE LITERATURE TO HAVE     A SENSITIVITY AND NEGATIVE     PREDICTIVE VALUE OF AT LEAST     98 TO 99%.  THE TEST RESULT     SHOULD BE CORRELATED WITH     AN ASSESSMENT OF THE CLINICAL     PROBABILITY OF DVT / VTE.  Rosezena Sensor, ED     Status: None   Collection Time    05/12/13  8:16 AM      Result Value Ref Range   Troponin i, poc 0.00  0.00 - 0.08 ng/mL   Comment 3            Comment: Due to the release kinetics of cTnI,     a negative result within the first hours     of the onset of symptoms does not rule out     myocardial infarction with certainty.     If myocardial infarction is still suspected,     repeat the test at appropriate intervals.   Dg Chest Port 1 View  05/12/2013   CLINICAL DATA:  Chest pain.  Short of breath.  EXAM: PORTABLE CHEST - 1 VIEW  COMPARISON:  DG CHEST 2 VIEW dated 09/14/2012  FINDINGS: Power pack projects over the right chest. Median sternotomy/ CABG. Mild apical lordotic projection accentuates the cardiopericardial silhouette. There is no failure. No pleural effusion. No pulmonary edema.  IMPRESSION: Apical lordotic projection. CABG. No acute cardiopulmonary disease.   Electronically Signed   By: Andreas Newport M.D.   On: 05/12/2013 07:58    VITALS: Blood pressure 126/72, pulse 66, temperature 98.1 F (36.7 C), temperature source  Oral, resp. rate 14, height 5\' 8"  (1.727 m), weight 270 lb (122.471 kg), SpO2 91.00%.  EXAM:  General: Alert, oriented x3, no distress, his exam is somewhat limited by obesity Head: no evidence of trauma, PERRL, EOMI, no exophtalmos or lid lag, no myxedema, no xanthelasma; normal ears, nose and oropharynx Neck: Elevated 7-8 cm jugular venous pulsations and prompt hepatojugular reflux; brisk carotid pulses without delay and no carotid bruits Chest: clear to auscultation, no signs of consolidation by percussion or palpation, normal fremitus, symmetrical and full respiratory excursions; sternotomy scar; right subclavian generator for deep brain stimulator Cardiovascular: normal position and quality of the apical impulse, regular rhythm, normal first heart sound and normal second heart sounds, no rubs or gallops, no murmur Abdomen: no tenderness or distention, no masses by palpation, no abnormal pulsatility or arterial bruits, normal bowel sounds, no hepatosplenomegaly Extremities: no clubbing, cyanosis; 1-2+ symmetrical ankle edema; 2+ radial, ulnar and brachial pulses bilaterally; 2+ right femoral, posterior tibial and dorsalis pedis pulses; 2+ left femoral, posterior tibial and dorsalis pedis pulses; no subclavian or femoral bruits Neurological: grossly nonfocal   IMPRESSION: Acute on chronic systolic and diastolic heart failure secondary to noncompliance with diet and medications Moderate ischemic cardiomyopathy, estimated EF 35-40% Coronary artery disease status post remote bypass surgery Low suspicion for new acute coronary syndrome Obstructive sleep apnea, noncompliant with CPAP Severe obesity Diabetes mellitus type 2 Hypercholesterolemia Parkinson's disease  PLAN: Admit for intravenous diuretics  Discussed at length the importance of sodium restriction, compliance of medications and daily weight monitoring, as well as the signs and symptoms of early heart failure exacerbation Cycle  cardiac enzymes, but doubt that this reflects a new ischemic event Repeat an echocardiogram (last study performed roughly 2 years ago) Probably needs a roughly 10 pound weight loss via diuresis before discharge  Sanda Klein, MD, [facility] [phone] office [phone] pager  05/12/2013, 10:39 AM

## 2013-05-13 DIAGNOSIS — I5043 Acute on chronic combined systolic (congestive) and diastolic (congestive) heart failure: Secondary | ICD-10-CM | POA: Diagnosis not present

## 2013-05-13 DIAGNOSIS — I517 Cardiomegaly: Secondary | ICD-10-CM

## 2013-05-13 LAB — BASIC METABOLIC PANEL
BUN: 19 mg/dL (ref 6–23)
CALCIUM: 8.7 mg/dL (ref 8.4–10.5)
CO2: 33 mEq/L — ABNORMAL HIGH (ref 19–32)
Chloride: 92 mEq/L — ABNORMAL LOW (ref 96–112)
Creatinine, Ser: 0.94 mg/dL (ref 0.50–1.35)
GFR calc Af Amer: >90 mL/min (ref >90)
GFR calc non Af Amer: 88 mL/min — ABNORMAL LOW (ref >90)
Glucose, Bld: 178 mg/dL — ABNORMAL HIGH (ref 70–99)
Potassium: 3.8 mEq/L (ref 3.7–5.3)
Sodium: 139 mEq/L (ref 137–147)

## 2013-05-13 LAB — TROPONIN I: Troponin I: <0.3 ng/mL (ref <0.30)

## 2013-05-13 MED ORDER — NITROGLYCERIN 0.4 MG SL SUBL: 0.4000 mg | SUBLINGUAL_TABLET | SUBLINGUAL | Status: DC | PRN

## 2013-05-13 MED ORDER — PERFLUTREN LIPID MICROSPHERE
1.0000 mL | INTRAVENOUS | Status: DC | PRN
  Filled 2013-05-13: qty 10

## 2013-05-13 MED ORDER — PERFLUTREN LIPID MICROSPHERE
INTRAVENOUS | Status: AC
  Administered 2013-05-13: 4 mL
  Filled 2013-05-13: qty 10

## 2013-05-13 NOTE — Progress Notes (Addendum)
  Echocardiogram 2D Echocardiogram with Definity has been performed.  Valinda Hoar 05/13/2013, 1:33 PM

## 2013-05-13 NOTE — Progress Notes (Signed)
PROGRESS NOTE  Subjective:   This is a 63 y.o. 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 His weight at his primary care physician's office was roughly 10 pounds higher than his usual. He has just returned from a trip to the beach. He ate out a lot. He was also noncompliant with his usual diuretic, since he had no place to urinate on his fishing trips. His abdomen has swollen to the point that the shorts he wore on his trip can no longer be buttoned up   Objective:    Vital Signs:   Temp:  [97.5 F (36.4 C)-98.5 F (36.9 C)] 97.5 F (36.4 C) (05/05 0541) Pulse Rate:  [64-71] 68 (05/05 0541) Resp:  [14-27] 20 (05/05 0541) BP: (105-126)/(52-93) 109/65 mmHg (05/05 0541) SpO2:  [91 %-96 %] 95 % (05/05 0541) FiO2 (%):  [21 %] 21 % (05/04 1100) Weight:  [268 lb 15.4 oz (122 kg)-269 lb 13.5 oz (122.4 kg)] 268 lb 15.4 oz (122 kg) (05/05 0541)  Last BM Date: 05/12/13   24-hour weight change: Weight change:   Weight trends: Filed Weights   05/12/13 0737 05/12/13 1509 05/13/13 0541  Weight: 270 lb (122.471 kg) 269 lb 13.5 oz (122.4 kg) 268 lb 15.4 oz (122 kg)    Intake/Output:  05/04 0701 - 05/05 0700 In: 960 [P.O.:960] Out: 4425 [Urine:4425]     Physical Exam: BP 109/65  Pulse 68  Temp(Src) 97.5 F (36.4 C) (Oral)  Resp 20  Ht 5\' 9"  (1.753 m)  Wt 268 lb 15.4 oz (122 kg)  BMI 39.70 kg/m2  SpO2 95%  Wt Readings from Last 3 Encounters:  05/13/13 268 lb 15.4 oz (122 kg)  04/08/13 281 lb 6.4 oz (127.642 kg)  02/19/13 273 lb 9 oz (124.087 kg)    General: Vital signs reviewed and noted. Moderately 0bese  Head: Normocephalic, atraumatic.  Eyes: conjunctivae/corneas  clear.  EOM's intact.   Throat: normal  Neck:  thick, but no JVD  Lungs:    clear  Heart:  RR, normal S1S2  Abdomen:  Soft, non-tender, moderate obesity  Extremities: Trace edema   Neurologic: A&O X3, CN II - XII are grossly intact.   Psych: Normal     Labs: BMET:  Recent Labs  05/12/13 0808 05/12/13 1205 05/13/13 0540  NA 141  --  139  K 4.3  --  3.8  CL 100  --  92*  CO2 29  --  33*  GLUCOSE 186*  --  178*  BUN 13  --  19  CREATININE 0.82 0.90 0.94  CALCIUM 8.9  --  8.7    Liver function tests: No results found for this basename: AST, ALT, ALKPHOS, BILITOT, PROT, ALBUMIN,  in the last 72 hours No results found for this basename: LIPASE, AMYLASE,  in the last 72 hours  CBC:  Recent Labs  05/12/13 0808 05/12/13 1205  WBC 6.2 6.6  HGB 13.1 13.4  HCT 40.2 40.7  MCV 84.6 84.4  PLT 151 153    Cardiac Enzymes:  Recent Labs  05/12/13 1205 05/12/13 1630 05/12/13 2350  TROPONINI <0.30 <0.30 <0.30    Coagulation Studies: No results found for this basename: LABPROT, INR,  in the last 72 hours  Other: No  components found with this basename: POCBNP,   Recent Labs  05/12/13 0808  DDIMER 0.33     Other results:  Tele:  NSR  Medications:    Infusions:    Scheduled Medications: . aspirin EC  81 mg Oral Daily  . clonazePAM  0.75 mg Oral QHS  . furosemide  80 mg Intravenous Q12H  . glimepiride  1 mg Oral Q supper  . glimepiride  2 mg Oral Q breakfast  . heparin  5,000 Units Subcutaneous 3 times per day  . levothyroxine  125 mcg Oral QAC breakfast  . linagliptin  5 mg Oral Q breakfast  . losartan  50 mg Oral QHS  . metFORMIN  1,000 mg Oral BID WC  . metoprolol succinate  50 mg Oral QHS  . multivitamin with minerals  1 tablet Oral Daily  . potassium chloride  30 mEq Oral QHS  . pramipexole  0.5 mg Oral BID  . sodium chloride  3 mL Intravenous Q12H  . travoprost (benzalkonium)  1 drop Right Eye QHS  . trihexyphenidyl  2 mg Oral BID WC  .  vitamin B-12  1,000 mcg Oral Daily    Assessment/ Plan:    1. Acute on chronic combined systolic and diastolic congestive heart failure:  Mr. Cowger is doing quite a bit better after receiving IV Lasix. He admits that he did not take his Lasix as prescribed because he was on a fishing trip and did not have a place to go to the bathroom. He's been on a stable medical regimen and has done well for many years. It appears that this admission was due to his skipping doses of medications and eating too much salt. We'll discharge him home today on his usual home medications.  Followup to see Dr. Stanford Breed the next week or so. He may need to see the nurse practitioner or 59 assistant of Dr. Stanford Breed does not have an   Appointment available .   2. Noncompliance: 3. Obstructive sleep apnea: 4. Severe obesity: 5. Diabetes mellitus: 6. Hyperlipidemia: 7. Parkinson's disease:  Disposition: Discharged home today. Length of Stay: 1  Thayer Headings, Brooke Bonito., MD, [facility] 05/13/2013, 8:32 AM Office [phone] Pager [phone]

## 2013-05-13 NOTE — Progress Notes (Signed)
Discharge orders given to pt and spouse . Both verbalized understanding . Wheeled to lobby by NT

## 2013-05-13 NOTE — Care Management Note (Addendum)
  Page 1 of 1   05/13/2013     4:25:06 PM CARE MANAGEMENT NOTE 05/13/2013  Patient:  Henry [name], Henry [name]   Account Number:  [id number]  Date Initiated:  05/13/2013  Documentation initiated by:  Shemuel Harkleroad  Subjective/Objective Assessment:   Admitted with CHF     Action/Plan:   CM to follow for dispositon needs   Anticipated DC Date:  05/13/2013   Anticipated DC Plan:  HOME/SELF CARE         Choice offered to / List presented to:             Status of service:  Completed, signed off Medicare Important Message given?   (If response is "NO", the following Medicare IM given date fields will be blank) Date Medicare IM given:   Date Additional Medicare IM given:    Discharge Disposition:  HOME/SELF CARE  Per UR Regulation:  Reviewed for med. necessity/level of care/duration of stay  If discussed at [place] of Stay Meetings, dates discussed:    Comments:

## 2013-05-13 NOTE — Discharge Summary (Signed)
Patient ID: Henry [name],  MRN: [id number], DOB/AGE: 62/15/1952 62 y.o.  Admit date: 05/12/2013 Discharge date: 05/13/2013  Primary Care Provider: Dr Viona Gilmore. [place] Primary Cardiologist: DR Stanford Breed  Discharge Diagnoses Principal Problem:   Acute on chronic combined systolic and diastolic congestive heart failure, NYHA class 4 Active Problems:   Type II or unspecified type diabetes mellitus with peripheral circulatory disorders, uncontrolled(250.72)   HYPERTENSION, ESSENTIAL NOS   C A D- hx of CABG   Cardiomyopathy, ischemic- EF 35-40% by echo 5/13   OSA on CPAP   Obesity, morbid   PD (Parkinson's disease)    Hospital Course:  This is a 63 y.o. male with a past medical history significant for CAD status post CABG (2000).His most recent nuclear stress test from 2013 shows an old large anteroapical scar with no significant ischemia and an ejection fraction of 41%. 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 Other problems include obesity, diabetes mellitus, systemic hypertension, hyperlipidemia, obstructive sleep apnea. He presented with dyspnea 05/12/13. He had been on a fishing trip and had missed some Lasix doses. He was admitted, given IV Lasix and diuresed 3.4 L. Dr Acie Fredrickson feels he can be discharged 05/13/13. He will resume his usual home meds. We'll see him in follow up in two weeks.    Discharge Vitals:  Blood pressure 109/65, pulse 68, temperature 97.5 F (36.4 C), temperature source Oral, resp. rate 20, height $RemoveBe'5\' 9"'[id number]$  (1.753 m), weight 268 lb 15.4 oz (122 kg), SpO2 95.00%.    Labs: Results for orders placed during the hospital encounter of 05/12/13 (from the past 48 hour(s))  CBC     Status: None   Collection Time    05/12/13  8:08 AM      Result Value Ref Range   WBC 6.2  4.0 - 10.5 K/uL   RBC 4.75  4.22 - 5.81 MIL/uL   Hemoglobin 13.1  13.0 - 17.0 g/dL   HCT 40.2  39.0 - 52.0 %   MCV 84.6  78.0 -  100.0 fL   MCH 27.6  26.0 - 34.0 pg   MCHC 32.6  30.0 - 36.0 g/dL   RDW 14.3  11.5 - 15.5 %   Platelets 151  150 - 400 K/uL  BASIC METABOLIC PANEL     Status: Abnormal   Collection Time    05/12/13  8:08 AM      Result Value Ref Range   Sodium 141  137 - 147 mEq/L   Potassium 4.3  3.7 - 5.3 mEq/L   Chloride 100  96 - 112 mEq/L   CO2 29  19 - 32 mEq/L   Glucose, Bld 186 (*) 70 - 99 mg/dL   BUN 13  6 - 23 mg/dL   Creatinine, Ser 0.82  0.50 - 1.35 mg/dL   Calcium 8.9  8.4 - 10.5 mg/dL   GFR calc non Af Amer >90  >90 mL/min   GFR calc Af Amer >90  >90 mL/min   Comment: (NOTE)     The eGFR has been calculated using the CKD EPI equation.     This calculation has not been validated in all clinical situations.     eGFR's persistently <90 mL/min signify possible Chronic Kidney     Disease.  PRO B NATRIURETIC PEPTIDE     Status: None   Collection Time    05/12/13  8:08 AM  Result Value Ref Range   Pro B Natriuretic peptide (BNP) 47.7  0 - 125 pg/mL  D-DIMER, QUANTITATIVE     Status: None   Collection Time    05/12/13  8:08 AM      Result Value Ref Range   D-Dimer, Quant 0.33  0.00 - 0.48 ug/mL-FEU   Comment:            AT THE INHOUSE ESTABLISHED CUTOFF     VALUE OF 0.48 ug/mL FEU,     THIS ASSAY HAS BEEN DOCUMENTED     IN THE LITERATURE TO HAVE     A SENSITIVITY AND NEGATIVE     PREDICTIVE VALUE OF AT LEAST     98 TO 99%.  THE TEST RESULT     SHOULD BE CORRELATED WITH     AN ASSESSMENT OF THE CLINICAL     PROBABILITY OF DVT / VTE.  Randolm Idol, ED     Status: None   Collection Time    05/12/13  8:16 AM      Result Value Ref Range   Troponin i, poc 0.00  0.00 - 0.08 ng/mL   Comment 3            Comment: Due to the release kinetics of cTnI,     a negative result within the first hours     of the onset of symptoms does not rule out     myocardial infarction with certainty.     If myocardial infarction is still suspected,     repeat the test at appropriate  intervals.  Randolm Idol, ED     Status: None   Collection Time    05/12/13 11:13 AM      Result Value Ref Range   Troponin i, poc 0.01  0.00 - 0.08 ng/mL   Comment 3            Comment: Due to the release kinetics of cTnI,     a negative result within the first hours     of the onset of symptoms does not rule out     myocardial infarction with certainty.     If myocardial infarction is still suspected,     repeat the test at appropriate intervals.  TROPONIN I     Status: None   Collection Time    05/12/13 12:05 PM      Result Value Ref Range   Troponin I <0.30  <0.30 ng/mL   Comment:            Due to the release kinetics of cTnI,     a negative result within the first hours     of the onset of symptoms does not rule out     myocardial infarction with certainty.     If myocardial infarction is still suspected,     repeat the test at appropriate intervals.  CBC     Status: None   Collection Time    05/12/13 12:05 PM      Result Value Ref Range   WBC 6.6  4.0 - 10.5 K/uL   RBC 4.82  4.22 - 5.81 MIL/uL   Hemoglobin 13.4  13.0 - 17.0 g/dL   HCT 40.7  39.0 - 52.0 %   MCV 84.4  78.0 - 100.0 fL   MCH 27.8  26.0 - 34.0 pg   MCHC 32.9  30.0 - 36.0 g/dL   RDW 14.5  11.5 - 15.5 %   Platelets 153  150 - 400 K/uL  CREATININE, SERUM     Status: None   Collection Time    05/12/13 12:05 PM      Result Value Ref Range   Creatinine, Ser 0.90  0.50 - 1.35 mg/dL   GFR calc non Af Amer >90  >90 mL/min   GFR calc Af Amer >90  >90 mL/min   Comment: (NOTE)     The eGFR has been calculated using the CKD EPI equation.     This calculation has not been validated in all clinical situations.     eGFR's persistently <90 mL/min signify possible Chronic Kidney     Disease.  TROPONIN I     Status: None   Collection Time    05/12/13  4:30 PM      Result Value Ref Range   Troponin I <0.30  <0.30 ng/mL   Comment:            Due to the release kinetics of cTnI,     a negative result within  the first hours     of the onset of symptoms does not rule out     myocardial infarction with certainty.     If myocardial infarction is still suspected,     repeat the test at appropriate intervals.  GLUCOSE, CAPILLARY     Status: Abnormal   Collection Time    05/12/13  5:00 PM      Result Value Ref Range   Glucose-Capillary 127 (*) 70 - 99 mg/dL  TROPONIN I     Status: None   Collection Time    05/12/13 11:50 PM      Result Value Ref Range   Troponin I <0.30  <0.30 ng/mL   Comment:            Due to the release kinetics of cTnI,     a negative result within the first hours     of the onset of symptoms does not rule out     myocardial infarction with certainty.     If myocardial infarction is still suspected,     repeat the test at appropriate intervals.  BASIC METABOLIC PANEL     Status: Abnormal   Collection Time    05/13/13  5:40 AM      Result Value Ref Range   Sodium 139  137 - 147 mEq/L   Potassium 3.8  3.7 - 5.3 mEq/L   Chloride 92 (*) 96 - 112 mEq/L   CO2 33 (*) 19 - 32 mEq/L   Glucose, Bld 178 (*) 70 - 99 mg/dL   BUN 19  6 - 23 mg/dL   Creatinine, Ser 0.94  0.50 - 1.35 mg/dL   Calcium 8.7  8.4 - 10.5 mg/dL   GFR calc non Af Amer 88 (*) >90 mL/min   GFR calc Af Amer >90  >90 mL/min   Comment: (NOTE)     The eGFR has been calculated using the CKD EPI equation.     This calculation has not been validated in all clinical situations.     eGFR's persistently <90 mL/min signify possible Chronic Kidney     Disease.    Disposition:      Follow-up Information   Follow up with [facility] R, NP On 05/28/2013. (9 am)    Specialty:  Cardiology   Contact information:   [address] [place] [address] [phone]       Discharge Medications:    Medication  List         aspirin EC 81 MG tablet  Take 81 mg by mouth daily.     clonazePAM 0.5 MG tablet  Commonly known as:  KLONOPIN  Take 0.75 mg by mouth at bedtime.     furosemide 80 MG  tablet  Commonly known as:  LASIX  Take 120 mg by mouth daily.     glimepiride 2 MG tablet  Commonly known as:  AMARYL  Take 1-2 mg by mouth 2 (two) times daily. 2 mg every morning and 1 mg every evening     glucose blood test strip  Commonly known as:  ONE TOUCH ULTRA TEST  Check blood sugar once a day     ibuprofen 200 MG tablet  Commonly known as:  ADVIL,MOTRIN  Take 800 mg by mouth daily as needed for pain.     levothyroxine 125 MCG tablet  Commonly known as:  SYNTHROID, LEVOTHROID  Take 125 mcg by mouth daily before breakfast.     losartan 50 MG tablet  Commonly known as:  COZAAR  Take 50 mg by mouth at bedtime.     metoprolol succinate 50 MG 24 hr tablet  Commonly known as:  TOPROL-XL  Take 50 mg by mouth at bedtime. Take with or immediately following a meal.     multivitamin with minerals Tabs tablet  Take 1 tablet by mouth daily. Antioxidant  Multi vitamin     nitroGLYCERIN 0.4 MG SL tablet  Commonly known as:  NITROSTAT  Place 1 tablet (0.4 mg total) under the tongue every 5 (five) minutes as needed for chest pain.     ONETOUCH DELICA LANCETS FINE Misc  Use as directed     potassium chloride 10 MEQ tablet  Commonly known as:  K-DUR  Take 30 mEq by mouth at bedtime.     pramipexole 0.5 MG tablet  Commonly known as:  MIRAPEX  Take 0.5 mg by mouth 2 (two) times daily.     sitaGLIPtin-metformin 50-1000 MG per tablet  Commonly known as:  JANUMET  Take 1 tablet by mouth 2 (two) times daily with a meal.     traMADol 50 MG tablet  Commonly known as:  ULTRAM  Take 50 mg by mouth 2 (two) times daily as needed (for pain).     travoprost (benzalkonium) 0.004 % ophthalmic solution  Commonly known as:  TRAVATAN  Place 1 drop into the right eye at bedtime.     trihexyphenidyl 2 MG tablet  Commonly known as:  ARTANE  Take 2 mg by mouth 2 (two) times daily with a meal.     vitamin B-12 1000 MCG tablet  Commonly known as:  CYANOCOBALAMIN  Take 1,000 mcg by mouth  daily.         Duration of Discharge Encounter: Greater than 30 minutes including physician time.  Angelena Form PA-C 05/13/2013 9:16 AM  See my note from earlier the same day  Ramond Dial., MD, [facility] 05/13/2013, 3:13 PM Office - [phone] Pager [place]

## 2013-05-13 NOTE — Discharge Instructions (Signed)
Heart Failure Heart failure means your heart has trouble pumping blood. This makes it hard for your body to work well. Heart failure is usually a long-term (chronic) condition. You must take good care of yourself and follow your doctor's treatment plan. HOME CARE  Take your heart medicine as told by your doctor.  Do not stop taking medicine unless your doctor tells you to.  Do not skip any dose of medicine.  Refill your medicines before they run out.  Take other medicines only as told by your doctor or pharmacist.  Stay active if told by your doctor. The elderly and people with severe heart failure should talk with a doctor about physical activity.  Eat heart healthy foods. Choose foods that are without trans fat and are low in saturated fat, cholesterol, and salt (sodium). This includes fresh or frozen fruits and vegetables, fish, lean meats, fat-free or low-fat dairy foods, whole grains, and high-fiber foods. Lentils and dried peas and beans (legumes) are also good choices.  Limit salt if told by your doctor.  Cook in a healthy way. Roast, grill, broil, bake, poach, steam, or stir-fry foods.  Limit fluids as told by your doctor.  Weigh yourself every morning. Do this after you pee (urinate) and before you eat breakfast. Write down your weight to give to your doctor.  Take your blood pressure and write it down if your doctor tell you to.  Ask your doctor how to check your pulse. Check your pulse as told.  Lose weight if told by your doctor.  Stop smoking or chewing tobacco. Do not use gum or patches that help you quit without your doctor's approval.  Schedule and go to doctor visits as told.  Nonpregnant women should have no more than 1 drink a day. Men should have no more than 2 drinks a day. Talk to your doctor about drinking alcohol.  Stop illegal drug use.  Stay current with shots (immunizations).  Manage your health conditions as told by your doctor.  Learn to manage  your stress.  Rest when you are tired.  If it is really hot outside:  Avoid intense activities.  Use air conditioning or fans, or get in a cooler place.  Avoid caffeine and alcohol.  Wear loose-fitting, lightweight, and light-colored clothing.  If it is really cold outside:  Avoid intense activities.  Layer your clothing.  Wear mittens or gloves, a hat, and a scarf when going outside.  Avoid alcohol.  Learn about heart failure and get support as needed.  Get help to maintain or improve your quality of life and your ability to care for yourself as needed. GET HELP IF:   You gain 03 lb/1.4 kg or more in 1 day or 05 lb/2.3 kg in a week.  You are more short of breath than usual.  You cannot do your normal activities.  You tire easily.  You cough more than normal, especially with activity.  You have any or more puffiness (swelling) in areas such as your hands, feet, ankles, or belly (abdomen).  You cannot sleep because it is hard to breathe.  You feel like your heart is beating fast (palpitations).  You get dizzy or lightheaded when you stand up. GET HELP RIGHT AWAY IF:   You have trouble breathing.  There is a change in mental status, such as becoming less alert or not being able to focus.  You have chest pain or discomfort.  You faint. MAKE SURE YOU:   Understand these   instructions.  Will watch your condition.  Will get help right away if you are not doing well or get worse. Document Released: 10/05/2007 Document Revised: 04/22/2012 Document Reviewed: 07/27/2011 [employer] Patient Information 2014 [employer].  

## 2013-05-14 ENCOUNTER — Other Ambulatory Visit: Payer: Self-pay

## 2013-05-14 MED ORDER — TRAMADOL HCL 50 MG PO TABS: 50.0000 mg | ORAL_TABLET | Freq: Two times a day (BID) | ORAL | Status: DC | PRN

## 2013-05-14 NOTE — Telephone Encounter (Signed)
Last ov with you on 04/08/13

## 2013-05-14 NOTE — Telephone Encounter (Signed)
OK X1 

## 2013-05-21 ENCOUNTER — Encounter: Payer: Self-pay | Admitting: Internal Medicine

## 2013-05-21 ENCOUNTER — Ambulatory Visit (INDEPENDENT_AMBULATORY_CARE_PROVIDER_SITE_OTHER)
Admission: RE | Admit: 2013-05-21 | Discharge: 2013-05-21 | Disposition: A | Discharge status: Home / Self Care | Payer: Federal, State, Local not specified - PPO | Source: Ambulatory Visit | Attending: Internal Medicine | Admitting: Internal Medicine

## 2013-05-21 ENCOUNTER — Ambulatory Visit (INDEPENDENT_AMBULATORY_CARE_PROVIDER_SITE_OTHER): Payer: Federal, State, Local not specified - PPO | Admitting: Internal Medicine

## 2013-05-21 ENCOUNTER — Other Ambulatory Visit: Payer: Federal, State, Local not specified - PPO

## 2013-05-21 VITALS — BP 108/70 | HR 74 | Temp 98.6°F | Wt 273.6 lb

## 2013-05-21 DIAGNOSIS — R0609 Other forms of dyspnea: Secondary | ICD-10-CM | POA: Diagnosis not present

## 2013-05-21 DIAGNOSIS — R0989 Other specified symptoms and signs involving the circulatory and respiratory systems: Secondary | ICD-10-CM

## 2013-05-21 DIAGNOSIS — J310 Chronic rhinitis: Secondary | ICD-10-CM

## 2013-05-21 DIAGNOSIS — R0600 Dyspnea, unspecified: Secondary | ICD-10-CM

## 2013-05-21 DIAGNOSIS — I2510 Atherosclerotic heart disease of native coronary artery without angina pectoris: Secondary | ICD-10-CM

## 2013-05-21 DIAGNOSIS — G4733 Obstructive sleep apnea (adult) (pediatric): Secondary | ICD-10-CM

## 2013-05-21 NOTE — Progress Notes (Signed)
Pre visit review using our clinic review tool, if applicable. No additional management support is needed unless otherwise documented below in the visit note. 

## 2013-05-21 NOTE — Patient Instructions (Signed)
Your ideal BP goal = AVERAGE < 135/85.Minimal goal is average < 140/90. Avoid ingestion of  excess salt/sodium.Cook with pepper & other spices . Use the salt substitute "No Salt" OR the Mrs Deliah Boston products to season food @ the table. Avoid foods which taste salty or "vinegary" as their sodium content will be high. Use CPAP every night  & stay on Furosemide

## 2013-05-21 NOTE — Progress Notes (Signed)
   Subjective:    Patient ID: Henry [name], male    DOB: [birth year], 63 y.o.   MRN: [id number]  HPI  Symptoms began 05/09/13 with rhinitis, chest congestion, and nonproductive cough. He then did produce scant clear-yellow sputum and had associated sneezing.  He is exposed to secondhand smoke as his wife is an active smoker  He has used Tylenol for muscle discomfort sustained with the cough  At this time he describes some slight sore throat. He has had some discoloration of nasal secretions. Dry cough is persistent and significant. He will have scant clear-yellow sputum also. He's noticed some wheezing and shortness of breath.  He states he feels fatigued.He has had PNDyspnea 2-3 X in past week. He was hospitalized last week with apparent volume overload 5/4-05/13/13. This was in the context of not taking his furosemide while he was fishing at the beach for least 2 days. Her also was not using CPAP at night. With diuresis he lost 3.4 L of volume. He states he " lost 10 pounds". His troponin , d-dimer as well as BNP studies were normal.    He does have a history of asthma.   Review of Systems  He specifically denies frontal headache, facial pain, dental pain, earache, or otic discharge. He has no fever, chills, or sweats  He does not have itchy, watery eyes but they burn. He uses saline drops..       Objective:   Physical Exam General appearance:good health ;well nourished; no acute distress or increased work of breathing is present.  No  lymphadenopathy about the head, neck, or axilla noted.   Eyes: No conjunctival inflammation or lid edema is present. There is no scleral icterus.  Ears:  External ear exam shows no significant lesions or deformities.  Otoscopic examination reveals clear canals, tympanic membranes are intact bilaterally without bulging, retraction, inflammation or discharge.  Nose:  External nasal examination shows no deformity or inflammation. Nasal mucosa are pink  and moist without lesions or exudates. No septal dislocation or deviation.No obstruction to airflow.     Oral exam: Dental hygiene is good; lips and gums are healthy appearing.There is no oropharyngeal erythema or exudate noted.   Neck:  No deformities, thyromegaly, masses, or tenderness noted.   Supple with full range of motion without pain. Neck vein distention at 10  Heart:  Normal rate and regular rhythm. S1 and S2 normal without gallop, murmur, click, rub or other extra sounds.   Lungs:Chest clear to auscultation; no wheezes, rhonchi,rales ,or rubs present.No increased work of breathing.    No hepatojugular reflux. Abdomen protuberant. No organomegaly or masses.  Extremities:  No cyanosis or clubbing  noted . 1/2+ edema at the sock line   Skin: Warm & dry w/o jaundice or tenting.         Assessment & Plan:  #1  Rhinitis w/o clinical bronchitis or sinusitis #2 PNDyspnea in context of recent volume overload. This was related to noncompliance with his diuretic and CPAP. See orders & AVS. I reviewed his portable chest x-ray for the hospital with him; this demonstrated dramatic Cardiomegaly  & vascular accentuation. I explained the significance of these findings and the necessity for compliance with the medicine & the CPAP to prevent progressive symptoms and potential serious complications.

## 2013-05-22 ENCOUNTER — Telehealth: Payer: Self-pay | Admitting: Cardiology

## 2013-05-28 ENCOUNTER — Ambulatory Visit: Payer: Federal, State, Local not specified - PPO | Admitting: Cardiology

## 2013-05-28 ENCOUNTER — Other Ambulatory Visit: Payer: Self-pay

## 2013-05-28 ENCOUNTER — Other Ambulatory Visit: Payer: Self-pay | Admitting: Internal Medicine

## 2013-05-28 NOTE — Telephone Encounter (Signed)
OK X1, R X1 

## 2013-05-28 NOTE — Telephone Encounter (Signed)
Last ov 05/21/13 Tramadol last filled 05/14/2013 #30 no refills

## 2013-05-29 MED ORDER — TRAMADOL HCL 50 MG PO TABS: 50.0000 mg | ORAL_TABLET | Freq: Two times a day (BID) | ORAL | Status: DC | PRN

## 2013-05-29 NOTE — Telephone Encounter (Signed)
Closed encounter °

## 2013-06-04 ENCOUNTER — Encounter: Payer: Federal, State, Local not specified - PPO | Admitting: Physician Assistant

## 2013-06-04 ENCOUNTER — Other Ambulatory Visit: Payer: Self-pay | Admitting: Internal Medicine

## 2013-06-05 ENCOUNTER — Ambulatory Visit (INDEPENDENT_AMBULATORY_CARE_PROVIDER_SITE_OTHER): Payer: Federal, State, Local not specified - PPO | Admitting: Cardiology

## 2013-06-05 ENCOUNTER — Encounter: Payer: Self-pay | Admitting: Cardiology

## 2013-06-05 VITALS — BP 110/70 | HR 68 | Ht 69.0 in | Wt 276.0 lb

## 2013-06-05 DIAGNOSIS — I5043 Acute on chronic combined systolic (congestive) and diastolic (congestive) heart failure: Secondary | ICD-10-CM

## 2013-06-05 DIAGNOSIS — I509 Heart failure, unspecified: Secondary | ICD-10-CM

## 2013-06-05 DIAGNOSIS — I2510 Atherosclerotic heart disease of native coronary artery without angina pectoris: Secondary | ICD-10-CM | POA: Diagnosis not present

## 2013-06-05 DIAGNOSIS — G20A1 Parkinson's disease without dyskinesia, without mention of fluctuations: Secondary | ICD-10-CM

## 2013-06-05 DIAGNOSIS — I5020 Unspecified systolic (congestive) heart failure: Secondary | ICD-10-CM

## 2013-06-05 DIAGNOSIS — I255 Ischemic cardiomyopathy: Secondary | ICD-10-CM

## 2013-06-05 DIAGNOSIS — R0602 Shortness of breath: Secondary | ICD-10-CM

## 2013-06-05 DIAGNOSIS — I428 Other cardiomyopathies: Secondary | ICD-10-CM

## 2013-06-05 DIAGNOSIS — E785 Hyperlipidemia, unspecified: Secondary | ICD-10-CM

## 2013-06-05 DIAGNOSIS — I2589 Other forms of chronic ischemic heart disease: Secondary | ICD-10-CM

## 2013-06-05 DIAGNOSIS — Z9989 Dependence on other enabling machines and devices: Secondary | ICD-10-CM

## 2013-06-05 DIAGNOSIS — G20 Parkinson's disease: Secondary | ICD-10-CM

## 2013-06-05 DIAGNOSIS — G4733 Obstructive sleep apnea (adult) (pediatric): Secondary | ICD-10-CM

## 2013-06-05 DIAGNOSIS — I429 Cardiomyopathy, unspecified: Secondary | ICD-10-CM

## 2013-06-05 LAB — BASIC METABOLIC PANEL
BUN: 17 mg/dL (ref 6–23)
CALCIUM: 8.9 mg/dL (ref 8.4–10.5)
CO2: 35 mEq/L — ABNORMAL HIGH (ref 19–32)
Chloride: 97 mEq/L (ref 96–112)
Creat: 1.04 mg/dL (ref 0.50–1.35)
Glucose, Bld: 173 mg/dL — ABNORMAL HIGH (ref 70–99)
Potassium: 4.2 mEq/L (ref 3.5–5.3)
Sodium: 139 mEq/L (ref 135–145)

## 2013-06-05 MED ORDER — ROSUVASTATIN CALCIUM 10 MG PO TABS: 10.0000 mg | ORAL_TABLET | Freq: Every day | ORAL | Status: DC

## 2013-06-05 MED ORDER — DIAZEPAM 5 MG PO TABS: 5.0000 mg | ORAL_TABLET | Freq: Once | ORAL | Status: DC

## 2013-06-05 NOTE — Assessment & Plan Note (Signed)
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

## 2013-06-05 NOTE — Assessment & Plan Note (Signed)
Check pro BNP

## 2013-06-05 NOTE — Assessment & Plan Note (Signed)
Last nuc was 2013, will repeat as stated.

## 2013-06-05 NOTE — Progress Notes (Signed)
06/05/2013   PCP: [name], MD   Chief Complaint  Patient presents with  . [facility]up    [facility]    Primary Cardiologist:  Dr. Stanford Breed  HPI:  63 y.o. male with a past medical history significant for CAD status post CABG (2000).His most recent nuclear stress test from 2013 shows an old large anteroapical scar with no significant ischemia and an ejection fraction of 41%. He has a history of systolic heart failure secondary to ischemic cardiomyopathy (EF 35-40%) , this had been stable. Other problems include obesity, diabetes mellitus, systemic hypertension, hyperlipidemia, obstructive sleep apnea, and Parkinson's disease with a deep brain stimulator. He presented with dyspnea 05/12/13 to emergency room at [facility]. He had been on a fishing trip and had missed some Lasix doses and had dined on a large amounts of seafood . He was admitted, given IV Lasix and diuresed.  He was discharged on his usual home meds. Prior to discharge he had an echo that was pending at discharge.  Today patient is here for followup.  Most recent echo Left ventricle: The cavity size was mildly dilated. Wall thickness was increased in a pattern of mild LVH. Systolic function was severely reduced. The estimated ejection fraction was in the range of 25% to 30%. There is dyskinesis of the mid-distalanteroseptal myocardium. Left ventricular diastolic function parameters were normal.  This is a new decrease in his ejection fraction.   I discussed with Dr. Olevia Perches. Currently patient is without shortness of breath except with exertion no chest pain. He is able to sleep at night without shortness of breath sleeps on 2 pillows and always has.  He is attempting his CPAP again with nasal pillows we discussed the importance of using.   Most recent lipid studies:  04/08/13 t chol 208  tg 220 HDL 37 LDL 126  he was placed back on a statin today. Previously had been on Crestor and he would like to try that  again before going to another medication.  Allergies  Allergen Reactions  . Penicillins Rash    Because of a history of documented adverse serious drug reaction;Medi Alert bracelet  is recommended    Current Outpatient Prescriptions  Medication Sig Dispense Refill  . ADVAIR DISKUS 250-50 MCG/DOSE AEPB       . aspirin EC 81 MG tablet Take 81 mg by mouth daily.      . clonazePAM (KLONOPIN) 0.5 MG tablet Take 0.75 mg by mouth at bedtime.      . furosemide (LASIX) 80 MG tablet Take 120 mg by mouth daily.      Marland Kitchen glimepiride (AMARYL) 2 MG tablet Take 1-2 mg by mouth 2 (two) times daily. 2 mg every morning and 1 mg every evening      . glucose blood (ONE TOUCH ULTRA TEST) test strip Check blood sugar once a day  100 each  12  . ibuprofen (ADVIL,MOTRIN) 200 MG tablet Take 800 mg by mouth daily as needed for pain.      Marland Kitchen KLOR-CON M10 10 MEQ tablet       . levothyroxine (SYNTHROID, LEVOTHROID) 125 MCG tablet Take 125 mcg by mouth daily before breakfast.      . losartan (COZAAR) 50 MG tablet Take 50 mg by mouth at bedtime.      . metoprolol succinate (TOPROL-XL) 50 MG 24 hr tablet Take 50 mg by mouth at bedtime. Take with or immediately following a meal.      .  Multiple Vitamin (MULTIVITAMIN WITH MINERALS) TABS tablet Take 1 tablet by mouth daily. Antioxidant  Multi vitamin      . nitroGLYCERIN (NITROSTAT) 0.4 MG SL tablet Place 1 tablet (0.4 mg total) under the tongue every 5 (five) minutes as needed for chest pain.  25 tablet  2  . ONETOUCH DELICA LANCETS FINE MISC Use as directed  100 each  11  . potassium chloride (K-DUR) 10 MEQ tablet Take 30 mEq by mouth at bedtime.      . pramipexole (MIRAPEX) 0.5 MG tablet Take 0.5 mg by mouth 2 (two) times daily.      . sitaGLIPtin-metformin (JANUMET) 50-1000 MG per tablet Take 1 tablet by mouth 2 (two) times daily with a meal.      . traMADol (ULTRAM) 50 MG tablet Take 1 tablet (50 mg total) by mouth 2 (two) times daily as needed (for pain).  30 tablet  1    . travoprost, benzalkonium, (TRAVATAN) 0.004 % ophthalmic solution Place 1 drop into the right eye at bedtime.       . trihexyphenidyl (ARTANE) 2 MG tablet Take 2 mg by mouth 2 (two) times daily with a meal.      . VENTOLIN HFA 108 (90 BASE) MCG/ACT inhaler       . vitamin B-12 (CYANOCOBALAMIN) 1000 MCG tablet Take 1,000 mcg by mouth daily.      . diazepam (VALIUM) 5 MG tablet Take 1 tablet (5 mg total) by mouth once. The morning of your stress test take 5 mg valium 30 min before you leave home, do not drive, may repeat at site if needed.  2 tablet  0  . rosuvastatin (CRESTOR) 10 MG tablet Take 1 tablet (10 mg total) by mouth daily.  30 tablet  6   No current facility-administered medications for this visit.    Past Medical History  Diagnosis Date  . Cardiomyopathy   . CAD (coronary artery disease)   . HTN (hypertension)     pt denies 08/19/12  . MI (myocardial infarction)   . Sleep apnea   . GERD (gastroesophageal reflux disease)   . Hyperthyroidism   . Hyperplasia, prostate   . Benign neoplasm of colon   . Nephrolithiasis   . Ventral hernia   . HLD (hyperlipidemia)   . Parkinson disease     1999  . OSA (obstructive sleep apnea)     AHI-28,on CPAP, noncompliant with CPAP  . Sleep apnea, organic   . Complication of anesthesia     pt states that he got a rash  . Arthritis   . Shortness of breath     Hx: of at all times  . UTI (lower urinary tract infection) 09/15/12    Klebsiella  . Asthma   . DM (diabetes mellitus)     TYPE 2     Past Surgical History  Procedure Laterality Date  . Coronary artery bypass graft  2000    [name], MD  . Coronary stent placement  1998  . Median sternotomy  2000  . Deep brain stimulator placement  2004    Right and left VIN stimulator placement   . Acoustic neuroma resection  1981  . Finger amputation      left pointer  . Cataract extraction w/ intraocular lens implant      Hx: of right eye  . Tonsillectomy    . Colonoscopy w/  biopsies and polypectomy      Hx: of  . Cardiac catheterization    .  Subthalamic stimulator battery replacement N/A 09/05/2012    Procedure: Deep brain stimulator battery change;  Surgeon: Erline Levine, MD;  Location: [facility];  Service: Neurosurgery;  Laterality: N/A;  Deep brain stimulator battery change    ZOX:[id number]:UJ colds or fevers, weight is up from discharge of hospital at that time 268 pounds now 276 though his edema is still minimal Skin:no rashes or ulcers HEENT:no blurred vision, no congestion CV:see HPI PUL:see HPI GI:no diarrhea constipation or melena, no indigestion GU:no hematuria, no dysuria MS:no joint pain, no claudication Neuro:no syncope, no lightheadedness Endo:+ diabetes- at times elevated due to diet, no thyroid disease  PHYSICAL EXAM BP 110/70  Pulse 68  Ht 5\' 9"  (1.753 m)  Wt 276 lb (125.193 kg)  BMI 40.74 kg/m2 General:Pleasant affect, NAD Skin:Warm and dry, brisk capillary refill HEENT:normocephalic, sclera clear, mucus membranes moist Neck:supple, no JVD, no bruits  Heart:S1S2 RRR without murmur, gallup, rub or click Lungs:clear without rales, rhonchi, or wheezes WJX:BJYNW, soft, non tender, + BS, do not palpate liver spleen or masses Ext:no to tr. lower ext edema, 2+ pedal pulses, 2+ radial pulses Neuro:alert and oriented, MAE, follows commands, + facial symmetry   ASSESSMENT AND PLAN Cardiomyopathy, ischemic- EF 35-40% by echo 5/13, but 05/2013 EF now 25-30% New decrease in EF now 25-30%, with recent admit for acute on chronic systolic HF.  Pt had been on fishing trip and not taken lasix and had eaten seafood.  He diuresed quickly and felt better.  Today with continued chronic SOB. Wt is slowly increasing.  He tries to eat low sodium foods.  Will order lexiscan myoview to eval for ischemic reason for decrease of EF.  Pt has claustrophobia and needs premedication with valium.  He also cannot walk on treadmill due to his parkinson's and need for  valium.   C A D- hx of CABG Last nuc was 2013, will repeat as stated.  Acute on chronic combined systolic and diastolic congestive heart failure, NYHA class 4 Improved but with slow increase of wt. See above note.  SOB (shortness of breath), mostly on exertion Check pro BNP  PD (Parkinson's disease) 2007 Deep brain stimulator ; Dr Vertell Limber S/P battery change X 2, last 09/05/12 Seeing Dr Minus [facility] after Dr Erling Cruz retired This may present a problem if pt requires ICD.  OSA on CPAP Is using CPAP with nasal pillows.  Discussed importance of using  HYPERLIPIDEMIA NEC/NOS Pt has been off statin, he did not tolerate lipitor in past, will resume crestor, some samples given,  Goal LDL >70 if pt can tolerate.   Patient will followup with me or Dr. Stanford Breed. If it is positive he'll need cardiac catheterization.

## 2013-06-05 NOTE — Assessment & Plan Note (Signed)
Is using CPAP with nasal pillows.  Discussed importance of using

## 2013-06-05 NOTE — Patient Instructions (Addendum)
We will schedule a lexiscan myoview next week at [facility] street office.  You will need follow up with Dr. Stanford Breed post stress test within 2 weeks post test.  Weigh daily Call [phone] if weight climbs more than 3 pounds in a day or 5 pounds in a week. No salt to very little salt in your diet.  No more than 2000 mg in a day. Call if increased shortness of breath or increased swelling.   your pump action of your heart is weaker than it has been we will check for any increased coronary disease.  Will add cholesterol med as well.  Crestor 10 mg daily.  I ordered 5 mg valium take one 30 min prior to leaving home for the stress test, DO NOT DRIVE, then may repeat if needed at department.

## 2013-06-05 NOTE — Assessment & Plan Note (Signed)
Improved but with slow increase of wt. See above note.

## 2013-06-05 NOTE — Assessment & Plan Note (Addendum)
New decrease in EF now 25-30%, with recent admit for acute on chronic systolic HF.  Pt had been on fishing trip and not taken lasix and had eaten seafood.  He diuresed quickly and felt better.  Today with continued chronic SOB. Wt is slowly increasing.  He tries to eat low sodium foods.  Will order lexiscan myoview to eval for ischemic reason for decrease of EF.  Pt has claustrophobia and needs premedication with valium.  He also cannot walk on treadmill due to his parkinson's and need for valium.

## 2013-06-05 NOTE — Assessment & Plan Note (Signed)
Pt has been off statin, he did not tolerate lipitor in past, will resume crestor, some samples given,  Goal LDL >70 if pt can tolerate.

## 2013-06-06 ENCOUNTER — Other Ambulatory Visit: Payer: Self-pay | Admitting: *Deleted

## 2013-06-06 DIAGNOSIS — R5381 Other malaise: Secondary | ICD-10-CM

## 2013-06-06 DIAGNOSIS — R5383 Other fatigue: Principal | ICD-10-CM

## 2013-06-06 LAB — PRO B NATRIURETIC PEPTIDE: Pro B Natriuretic peptide (BNP): 43.55 pg/mL (ref <126)

## 2013-06-16 ENCOUNTER — Other Ambulatory Visit: Payer: Self-pay | Admitting: Neurology

## 2013-06-16 ENCOUNTER — Other Ambulatory Visit: Payer: Self-pay | Admitting: Endocrinology

## 2013-06-17 ENCOUNTER — Ambulatory Visit (HOSPITAL_COMMUNITY): Payer: Federal, State, Local not specified - PPO | Attending: Cardiovascular Disease | Admitting: Radiology

## 2013-06-17 VITALS — BP 119/74 | Ht 69.0 in | Wt 271.0 lb

## 2013-06-17 DIAGNOSIS — R0989 Other specified symptoms and signs involving the circulatory and respiratory systems: Secondary | ICD-10-CM | POA: Insufficient documentation

## 2013-06-17 DIAGNOSIS — E119 Type 2 diabetes mellitus without complications: Secondary | ICD-10-CM | POA: Insufficient documentation

## 2013-06-17 DIAGNOSIS — I4949 Other premature depolarization: Secondary | ICD-10-CM

## 2013-06-17 DIAGNOSIS — I252 Old myocardial infarction: Secondary | ICD-10-CM | POA: Diagnosis not present

## 2013-06-17 DIAGNOSIS — I779 Disorder of arteries and arterioles, unspecified: Secondary | ICD-10-CM | POA: Diagnosis not present

## 2013-06-17 DIAGNOSIS — I10 Essential (primary) hypertension: Secondary | ICD-10-CM | POA: Insufficient documentation

## 2013-06-17 DIAGNOSIS — R0789 Other chest pain: Secondary | ICD-10-CM | POA: Insufficient documentation

## 2013-06-17 DIAGNOSIS — I429 Cardiomyopathy, unspecified: Secondary | ICD-10-CM

## 2013-06-17 DIAGNOSIS — Z951 Presence of aortocoronary bypass graft: Secondary | ICD-10-CM | POA: Diagnosis not present

## 2013-06-17 DIAGNOSIS — I2510 Atherosclerotic heart disease of native coronary artery without angina pectoris: Secondary | ICD-10-CM

## 2013-06-17 DIAGNOSIS — Z9861 Coronary angioplasty status: Secondary | ICD-10-CM | POA: Diagnosis not present

## 2013-06-17 DIAGNOSIS — R0609 Other forms of dyspnea: Secondary | ICD-10-CM | POA: Insufficient documentation

## 2013-06-17 DIAGNOSIS — R079 Chest pain, unspecified: Secondary | ICD-10-CM

## 2013-06-17 DIAGNOSIS — I5020 Unspecified systolic (congestive) heart failure: Secondary | ICD-10-CM

## 2013-06-17 DIAGNOSIS — R0602 Shortness of breath: Secondary | ICD-10-CM

## 2013-06-17 MED ORDER — REGADENOSON 0.4 MG/5ML IV SOLN
0.4000 mg | Freq: Once | INTRAVENOUS | Status: AC
  Administered 2013-06-17: 0.4 mg via INTRAVENOUS

## 2013-06-17 MED ORDER — TECHNETIUM TC 99M SESTAMIBI GENERIC - CARDIOLITE
10.8000 | Freq: Once | INTRAVENOUS | Status: AC | PRN
  Administered 2013-06-17: 11 via INTRAVENOUS

## 2013-06-17 MED ORDER — TECHNETIUM TC 99M SESTAMIBI GENERIC - CARDIOLITE
33.0000 | Freq: Once | INTRAVENOUS | Status: AC | PRN
  Administered 2013-06-17: 33 via INTRAVENOUS

## 2013-06-17 NOTE — Progress Notes (Signed)
[facility] [address] [place], [place] [address] [phone]    Cardiology Nuclear Med Study  Henry [name] is a 62 y.o. male     MRN : [id number]     DOB: 29-Oct-1950  Procedure Date: 06/17/2013  Nuclear Med Background Indication for Stress Test:  Evaluation for Ischemia, Graft Patency, Stent Patency and Low LVF History:  MI;Stent;CABG;'13 MPI:EF=41%,old large anteroapical scar and 5/15 Echo:EF=25-30% Cardiac Risk Factors: Carotid Disease, Hypertension, Lipids and NIDDM  Symptoms:  Chest Tightness with Exertion (last date of chest discomfort with any exertion) and DOE   Nuclear Pre-Procedure Caffeine/Decaff Intake:  None NPO After: 9:00pm   Lungs:   O2 Sat: 92% on room air. IV 0.9% NS with Angio Cath:  22g  IV Site: R Wrist  IV Started by:  Matilde Haymaker, RN  Chest Size (in):  56 Cup Size: n/a  Height: 5\' 9"  (1.753 m)  Weight:  271 lb (122.925 kg)  BMI:  Body mass index is 40 kg/(m^2). Tech Comments:  Toprol taken last pm    Nuclear Med Study 1 or 2 day study: 1 day  Stress Test Type:  Carlton Adam  Reading MD: n/a  Order Authorizing Provider:  Queen Blossom and Sandria Senter  Resting Radionuclide: Technetium 20m Sestamibi  Resting Radionuclide Dose: 11.0 mCi   Stress Radionuclide:  Technetium 66m Sestamibi  Stress Radionuclide Dose: 33.0 mCi           Stress Protocol Rest HR: 68 Stress HR: 83  Rest BP: 119/74 Stress BP: 117/73  Exercise Time (min): n/a METS: n/a   Predicted Max HR: 159 bpm % Max HR: 52.83 bpm Rate Pressure Product: 10416   Dose of Adenosine (mg):  n/a Dose of Lexiscan: 0.4 mg  Dose of Atropine (mg): n/a Dose of Dobutamine: n/a mcg/kg/min (at max HR)  Stress Test Technologist: Matilde Haymaker, RN  Nuclear Technologist:  Vedia Pereyra, CNMT     Rest Procedure:  Myocardial perfusion imaging was performed at rest 45 minutes following the intravenous administration of Technetium 37m Sestamibi. Rest ECG:  NSR with non-specific ST-T wave changes  Stress Procedure:  The patient received IV Lexiscan 0.4 mg over 15-seconds.  Technetium 20m Sestamibi injected at 30-seconds.  Quantitative spect images were obtained after a 45 minute delay. Stress ECG: No significant change from baseline ECG  QPS Raw Data Images:  Normal; no motion artifact; normal heart/lung ratio. Stress Images:  There is decreased uptake in the apical, anteroapical, apical septal and basal and mid anteroseptal segments. Rest Images:  Comparison with the stress images reveals no significant change. Subtraction (SDS):  No evidence of ischemia. Transient Ischemic Dilatation (Normal <1.22):  0.98 Lung/Heart Ratio (Normal <0.45):  0.27  Quantitative Gated Spect Images QGS EDV:  191 ml QGS ESV:  112 ml  Impression Exercise Capacity:  Lexiscan with no exercise. BP Response:  Normal blood pressure response. Clinical Symptoms:  No significant symptoms noted. ECG Impression:  No significant ECG changes with Lexiscan. Comparison with Prior Nuclear Study: No significant change from previous study  Overall Impression:  Low risk stress nuclear study.  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 No reversible ischemia.  LV Ejection Fraction: 41%.  LV Wall Motion:  Distal anteroseptal and apical hypokinesia   .Darlin Coco  MD

## 2013-06-19 ENCOUNTER — Ambulatory Visit (INDEPENDENT_AMBULATORY_CARE_PROVIDER_SITE_OTHER): Payer: Federal, State, Local not specified - PPO | Admitting: Neurology

## 2013-06-19 ENCOUNTER — Encounter: Payer: Self-pay | Admitting: Neurology

## 2013-06-19 VITALS — BP 138/66 | HR 76 | Resp 25 | Ht 69.0 in | Wt 272.0 lb

## 2013-06-19 DIAGNOSIS — I2510 Atherosclerotic heart disease of native coronary artery without angina pectoris: Secondary | ICD-10-CM

## 2013-06-19 DIAGNOSIS — G20 Parkinson's disease: Secondary | ICD-10-CM

## 2013-06-19 DIAGNOSIS — Z9989 Dependence on other enabling machines and devices: Secondary | ICD-10-CM

## 2013-06-19 DIAGNOSIS — G4733 Obstructive sleep apnea (adult) (pediatric): Secondary | ICD-10-CM | POA: Diagnosis not present

## 2013-06-19 DIAGNOSIS — G20A1 Parkinson's disease without dyskinesia, without mention of fluctuations: Secondary | ICD-10-CM | POA: Diagnosis not present

## 2013-06-19 DIAGNOSIS — G4752 REM sleep behavior disorder: Secondary | ICD-10-CM | POA: Diagnosis not present

## 2013-06-19 MED ORDER — CARBIDOPA-LEVODOPA 25-100 MG PO TABS: 1.0000 | ORAL_TABLET | Freq: Three times a day (TID) | ORAL | Status: DC

## 2013-06-19 NOTE — Patient Instructions (Signed)
1. Start Carbidopa Levodopa as follows: 1/2 tab three times a day before meals x 1 wk, then 1/2 in am & noon & 1 in evening for a week, then 1/2 in am &1 at noon &one in evening for a week, then 1 tablet three times a day before meals.  This prescription has been sent to your pharmacy.  2. Follow up 4 months.

## 2013-06-19 NOTE — Progress Notes (Signed)
Henry [name] was seen today in the movement disorders clinic for neurologic consultation at the request of [name].  His PCP is [name], MD.  The consultation is for the evaluation of PD and to manage his DBS.  He is accompanied by his wife who supplements the history.  The first symptom(s) the patient noticed was right hand tremor in 1999.  He was seen by neurology and was dx with PD.  He was placed on something, but it caused sleepiness.  He does not think that he has ever been on levodopa.   He was placed on Mirapex, which seemed to help.  He only takes it twice per day.  He didn't think it made a difference when he took it tid.  He began to have tremor and it was suggested he do DBS.    The pt is s/p stn DBS in 2005.  He had a battery change in 2009.    10/02/12 update: The patient is accompanied today by his daughter, who supplements the history.  I reviewed medical records available to me since last visit.  The patient had his generator changed on 09/05/2012.  He remains on pramipexole, 0.5 mg twice per day.  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 [name] in that regard.  He is not compliant with CPAP so says that he doesn't want to return to her for f/u. He doesn't take the klonopin faithfully.  10/11/12 update:  Pt was seen as a walk in/work in today.  Pt had increasing tremor, L greater than R for 3 days.  Thinks that it started with the d/c of artane.  Speech stable.  11/19/12 update:  Pt is seen today for his PD, accompanied by his daughter who supplements the hx.    He is currently on klonopin 0.5 mg - 1/2-1 tablet q hs.  He only takes it when his wife is not working.  She works nights and is only in 2 days per week.  He has some reluctance to take it other nights.  He is on pramipexole 0.5 mg bid.  Last visit, his DBS was reset more similar to the settings he had prior to coming here, just with an increased voltage.  About 2 weeks after our last  visit, the patient decided to go back on the Artane.  The combination of the Artane and the DBS changes helped significantly.  He does ask if I can slightly increased voltage on the left hand, as he has some tremor at night that is bothersome.  Otherwise, he is doing well.  He feels that his balance has been great.  02/19/13 update:  This patient is accompanied in the office by his child who supplements the history.  The pt has a hx of PD.  He has been tremor free and is very happy about that.  He has had some increased balance loss; the last fall was a few months ago but he hasn't gotten hurt.  Was considering hernia surgery.  The records that were made available to me were reviewed.  He is holding on that for now but plans to have it done before end of year.  No hallucinations.  He is still having some acting out of the dreams, despite clonazepam.  06/19/13 update:  Patient is returning to followup regarding his Parkinson's disease.  I had the opportunity to review records since last visit.  He went to the emergency room on 05/12/2013  with chest pain and shortness of breath.  He had been fishing and had missed several doses of Lasix.  He had been eating seafood.  He ended up with an acute exacerbation of his chronic congestive heart failure.  Once he was diuresed and back on his medications, he is feeling better.  He just had a nuclear medicine study done on 06/17/2013 and the ejection fraction on this looked better than on his echocardiogram.  The left ventricular ejection fraction was 41%.  Prior to that, there was some concern that his left ventricular ejection fraction had dropped so much that he may need an ICD.  In terms of Parkinson's disease, the patient states that he has been doing very well in terms of tremor, but his walking really has deteriorated.  He describes a festinating gait, with much more shuffling.  He fell walking over his dog gate but otherwise has not had falls.  He went fishing  yesterday and states that he "stumbled all day long."  No hallucinations.  He has been trying to be more faithful with his CPAP. He remains on Mirapex 0.5 mg twice a day, Artane 2 mg twice a day.   Neuroimaging has  previously been performed.  It is available for my review today.  This was done in February, 2005 in preparation for DBS.  The examination was essentially unremarkable.  The formal report is below:  Clinical Data: Parkinson's disease.  MRI BRAIN WITHOUT AND WITH CONTRAST  Technique: Stealth protocol.  Multiplanar T1- weighted images were obtained before and after the administration of 20 cc of Omniscan. 5.0, 3.0, and ultimately 1.3 mm thick images were obtained through the brain with special attention to the basal ganglia and deep commissures to allow for stereotactic planning and treatment of Parkinson's disease. There are no discrete or focal lesions demonstrated on these limited pulse sequences, which do not include a diffusion or T2- weighted pulse sequence. FLAIR images were obtained and show no significant small vessel disease or ventriculomegaly.  IMPRESSION  MRI of the brain without and with contrast performed according to Stealth protocol showing no obvious focal or enhancing lesion.   PREVIOUS MEDICATIONS: Mirapex and artane  ALLERGIES:   Allergies  Allergen Reactions  . Penicillins Rash    Because of a history of documented adverse serious drug reaction;Medi Alert bracelet  is recommended    CURRENT MEDICATIONS:     Medication List       This list is accurate as of: 06/19/13  1:37 PM.  Always use your most recent med list.               ADVAIR DISKUS 250-50 MCG/DOSE Aepb  Generic drug:  Fluticasone-Salmeterol  Inhale 1 puff into the lungs daily.     aspirin EC 81 MG tablet  Take 81 mg by mouth daily.     clonazePAM 0.5 MG tablet  Commonly known as:  KLONOPIN  Take 0.5 mg by mouth at bedtime.     furosemide 80 MG tablet  Commonly known as:  LASIX   Take 120 mg by mouth daily.     glimepiride 2 MG tablet  Commonly known as:  AMARYL  Take 1-2 mg by mouth 2 (two) times daily. 2 mg every morning and 1 mg every evening     glucose blood test strip  Commonly known as:  ONE TOUCH ULTRA TEST  Check blood sugar once a day     ibuprofen 200 MG tablet  Commonly known as:  ADVIL,MOTRIN  Take 800 mg by mouth daily as needed for pain.     KLOR-CON M10 10 MEQ tablet  Generic drug:  potassium chloride  Take 10 mEq by mouth daily.     levothyroxine 125 MCG tablet  Commonly known as:  SYNTHROID, LEVOTHROID  TAKE 1 TABLET EVERY DAY     losartan 50 MG tablet  Commonly known as:  COZAAR  Take 50 mg by mouth at bedtime.     metoprolol succinate 50 MG 24 hr tablet  Commonly known as:  TOPROL-XL  Take 50 mg by mouth at bedtime. Take with or immediately following a meal.     multivitamin with minerals Tabs tablet  Take 1 tablet by mouth daily. Antioxidant  Multi vitamin     nitroGLYCERIN 0.4 MG SL tablet  Commonly known as:  NITROSTAT  Place 1 tablet (0.4 mg total) under the tongue every 5 (five) minutes as needed for chest pain.     ONETOUCH DELICA LANCETS FINE Misc  Use as directed     potassium chloride 10 MEQ tablet  Commonly known as:  K-DUR  Take 30 mEq by mouth at bedtime.     pramipexole 0.5 MG tablet  Commonly known as:  MIRAPEX  Take 0.5 mg by mouth 2 (two) times daily.     rosuvastatin 10 MG tablet  Commonly known as:  CRESTOR  Take 20 mg by mouth daily.     sitaGLIPtin-metformin 50-1000 MG per tablet  Commonly known as:  JANUMET  Take 1 tablet by mouth 2 (two) times daily with a meal.     traMADol 50 MG tablet  Commonly known as:  ULTRAM  Take 1 tablet (50 mg total) by mouth 2 (two) times daily as needed (for pain).     travoprost (benzalkonium) 0.004 % ophthalmic solution  Commonly known as:  TRAVATAN  Place 1 drop into the right eye at bedtime.     trihexyphenidyl 2 MG tablet  Commonly known as:  ARTANE   Take 2 mg by mouth 2 (two) times daily with a meal.     VENTOLIN HFA 108 (90 BASE) MCG/ACT inhaler  Generic drug:  albuterol  Inhale 1 puff into the lungs daily.     vitamin B-12 1000 MCG tablet  Commonly known as:  CYANOCOBALAMIN  Take 1,000 mcg by mouth daily.         PAST MEDICAL HISTORY:   Past Medical History  Diagnosis Date  . Cardiomyopathy   . CAD (coronary artery disease)   . HTN (hypertension)     pt denies 08/19/12  . MI (myocardial infarction)   . Sleep apnea   . GERD (gastroesophageal reflux disease)   . Hyperthyroidism   . Hyperplasia, prostate   . Benign neoplasm of colon   . Nephrolithiasis   . Ventral hernia   . HLD (hyperlipidemia)   . Parkinson disease     1999  . OSA (obstructive sleep apnea)     AHI-28,on CPAP, noncompliant with CPAP  . Sleep apnea, organic   . Complication of anesthesia     pt states that he got a rash  . Arthritis   . Shortness of breath     Hx: of at all times  . UTI (lower urinary tract infection) 09/15/12    Klebsiella  . Asthma   . DM (diabetes mellitus)     TYPE 2     PAST SURGICAL HISTORY:   Past Surgical History  Procedure Laterality Date  .  Coronary artery bypass graft  2000    [name], MD  . Coronary stent placement  1998  . Median sternotomy  2000  . Deep brain stimulator placement  2004    Right and left VIN stimulator placement   . Acoustic neuroma resection  1981  . Finger amputation      left pointer  . Cataract extraction w/ intraocular lens implant      Hx: of right eye  . Tonsillectomy    . Colonoscopy w/ biopsies and polypectomy      Hx: of  . Cardiac catheterization    . Subthalamic stimulator battery replacement N/A 09/05/2012    Procedure: Deep brain stimulator battery change;  Surgeon: Erline Levine, MD;  Location: [facility];  Service: Neurosurgery;  Laterality: N/A;  Deep brain stimulator battery change    SOCIAL HISTORY:   History   Social History  . Marital Status: Married     Spouse Name: [name]    Number of Children: 2  . Years of Education: N/A   Occupational History  . 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 Smoking status: Never Smoker   . Smokeless tobacco: Never Used  . Alcohol Use: Yes     Comment: occasional wine  . Drug Use: No  . Sexual Activity: Not on file   Other Topics Concern  . Not on file   Social History Narrative  . No narrative on file    FAMILY HISTORY:   Family Status  Relation Status Death Age  . Mother Deceased     complications of surgery  . Father Deceased     EtOHism  . Sister Alive     2 full, one half; PVD  . Brother Deceased     AIDS  . Child Alive     3, alive and well    ROS:  A complete 10 system review of systems was obtained and was unremarkable apart from what is mentioned above.  PHYSICAL EXAMINATION:    VITALS:   Filed Vitals:   06/19/13 1320  BP: 138/66  Pulse: 76  Resp: 25  Height: 5\' 9"  (1.753 m)  Weight: 272 lb (123.378 kg)    GEN:  The patient appears stated age and is in NAD. HEENT:  Normocephalic, atraumatic.  The mucous membranes are moist. The superficial temporal arteries are without ropiness or tenderness. CV:  RRR Lungs:  CTAB Neck/HEME:  There are no carotid bruits bilaterally.  Neurological examination:  Orientation: The patient is alert and oriented x3. Fund of knowledge is appropriate.  Recent and remote memory are intact.  Attention and concentration are normal.    Able to name objects and repeat phrases. Cranial nerves: There is good facial symmetry. Pupils are equal round and reactive to light bilaterally. Fundoscopic exam reveals clear margins bilaterally. Extraocular muscles are intact. The visual fields are full to confrontational testing. The speech is fluent and just mildly dysarthric.  He is hypophonic. Soft palate rises symmetrically and there is no tongue deviation. Hearing is intact to conversational tone. Motor: Strength is 5/5  in the bilateral upper and lower extremities.   Shoulder shrug is equal and symmetric.  There is no pronator drift.  Movement examination: Tone: There is normal tone in the UE and LE bilaterally Abnormal movements: There is no tremor noted today Coordination:  There is no decremation with RAM's today Gait and Station: The patient has minimal difficulty arising out  of a deep-seated chair without the use of the hands. The patient's stride length is decreased and he is shuffling much more significantly and dragging the right leg.  He does have much more of a festinating gait.  LABS  Lab Results  Component Value Date   WBC 6.6 05/12/2013   HGB 13.4 05/12/2013   HCT 40.7 05/12/2013   MCV 84.4 05/12/2013   PLT 153 05/12/2013   Lab Results  Component Value Date   TSH 4.29 04/08/2013     Chemistry      Component Value Date/Time   NA 139 06/05/2013 1020   K 4.2 06/05/2013 1020   CL 97 06/05/2013 1020   CO2 35* 06/05/2013 1020   BUN 17 06/05/2013 1020   CREATININE 1.04 06/05/2013 1020   CREATININE 0.94 05/13/2013 0540      Component Value Date/Time   CALCIUM 8.9 06/05/2013 1020   ALKPHOS 59 04/08/2013 0937   AST 18 04/08/2013 0937   ALT 34 04/08/2013 0937   BILITOT 0.7 04/08/2013 0937     Lab Results  Component Value Date   VITAMINB12 268 08/19/2012   Lab Results  Component Value Date   HGBA1C 8.3* 04/08/2013   DBS programming was performed today, which is described in more detail on a separate programming procedural notes.  In short, there was no significant change following programming.  ASSESSMENT/PLAN:  1.  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    -I am going to continue the artane for now since he feels he is doing much better on the medication   -He does not wish to go to the neuro rehabilitation center currently for the Parkinson's program  -Programmed the patient programmer so that he could go up or down by 0.2 V on each side.   -I. talked to him about the  value of levodopa.  It may help balance somewhat.  We decided to go ahead and start that today and workup to carbidopa/levodopa 25/100, one tablet 3 times per day. 2.  RBD  -He will increase his clonazepam 0.5 mg, to 1-1/2 tablets per night. 3.  OSAS, noncompliant with CPAP.  -He is being much more faithful with his CPAP. 4.  Sialorrhea  -This is associated with Parkinson's disease.  He may benefit from Myobloc in the future. 5.  Dysphagia.  -Overall, this is fairly mild but I will give an eye on this and consider a MBE if needed. 6.  CHF  -If he needs an ICD in the future, there should be no problem with having that and a DBS in place.

## 2013-06-19 NOTE — Procedures (Signed)
DBS Programming was performed.    Total time spent programming was 35 minutes.  Device was confirmed to be on.  Soft start was confirmed to be on.  Impedences were checked and were within normal limits.  Battery was checked and was determined to be functioning normally and not near the end of life.  Final settings were as follows:  Left brain electrode:     1-2+           ; Amplitude  4.0   V   ; Pulse width 90 microseconds;   Frequency   170   Hz.  Right brain electrode:     4-7+          ; Amplitude   3.9  V ;  Pulse width 90  microseconds;  Frequency   170    Hz.

## 2013-06-20 ENCOUNTER — Encounter: Payer: Self-pay | Admitting: Neurology

## 2013-06-20 MED ORDER — PRAMIPEXOLE DIHYDROCHLORIDE 0.5 MG PO TABS: 0.5000 mg | ORAL_TABLET | Freq: Two times a day (BID) | ORAL | Status: DC

## 2013-06-20 NOTE — Telephone Encounter (Signed)
Mirapex refill requested. Per last office note- patient to remain on medication. Refill approved and sent to patient's pharmacy.   

## 2013-06-23 ENCOUNTER — Other Ambulatory Visit: Payer: Self-pay | Admitting: Neurology

## 2013-06-23 ENCOUNTER — Telehealth: Payer: Self-pay | Admitting: Cardiology

## 2013-06-23 NOTE — Telephone Encounter (Signed)
Pt. Called and informed of his stress test

## 2013-06-23 NOTE — Telephone Encounter (Signed)
°  Follow Up ° °Calling to follow up on stress test results. Please call. °

## 2013-06-24 ENCOUNTER — Telehealth: Payer: Self-pay | Admitting: Neurology

## 2013-06-24 DIAGNOSIS — H4030X0 Glaucoma secondary to eye trauma, unspecified eye, stage unspecified: Secondary | ICD-10-CM | POA: Diagnosis not present

## 2013-06-24 DIAGNOSIS — H409 Unspecified glaucoma: Secondary | ICD-10-CM | POA: Diagnosis not present

## 2013-06-24 DIAGNOSIS — H40039 Anatomical narrow angle, unspecified eye: Secondary | ICD-10-CM | POA: Diagnosis not present

## 2013-06-24 MED ORDER — TRIHEXYPHENIDYL HCL 2 MG PO TABS: 2.0000 mg | ORAL_TABLET | Freq: Three times a day (TID) | ORAL | Status: DC

## 2013-06-24 NOTE — Telephone Encounter (Signed)
Patient is being followed by Dr Wells Guiles Tat

## 2013-06-24 NOTE — Telephone Encounter (Signed)
Artane refill requested. Per last office note- patient to remain on medication. Refill approved and sent to patient's pharmacy.   

## 2013-06-30 DIAGNOSIS — R350 Frequency of micturition: Secondary | ICD-10-CM | POA: Diagnosis not present

## 2013-06-30 DIAGNOSIS — N200 Calculus of kidney: Secondary | ICD-10-CM | POA: Diagnosis not present

## 2013-07-03 ENCOUNTER — Other Ambulatory Visit: Payer: Self-pay | Admitting: Internal Medicine

## 2013-07-03 NOTE — Telephone Encounter (Signed)
OK X1 

## 2013-07-04 ENCOUNTER — Ambulatory Visit (INDEPENDENT_AMBULATORY_CARE_PROVIDER_SITE_OTHER): Payer: Federal, State, Local not specified - PPO | Admitting: Cardiology

## 2013-07-04 VITALS — BP 120/60 | HR 76 | Ht 69.0 in | Wt 270.0 lb

## 2013-07-04 DIAGNOSIS — R0609 Other forms of dyspnea: Secondary | ICD-10-CM

## 2013-07-04 DIAGNOSIS — I2589 Other forms of chronic ischemic heart disease: Secondary | ICD-10-CM

## 2013-07-04 DIAGNOSIS — I2510 Atherosclerotic heart disease of native coronary artery without angina pectoris: Secondary | ICD-10-CM

## 2013-07-04 DIAGNOSIS — R0602 Shortness of breath: Secondary | ICD-10-CM

## 2013-07-04 DIAGNOSIS — R0989 Other specified symptoms and signs involving the circulatory and respiratory systems: Secondary | ICD-10-CM

## 2013-07-04 DIAGNOSIS — I255 Ischemic cardiomyopathy: Secondary | ICD-10-CM

## 2013-07-04 NOTE — Progress Notes (Signed)
07/06/2013   PCP: [name], MD   Chief Complaint  Patient presents with  . Follow-up    one month visit     states he gets more short winded when talking than when walking    Primary Cardiologist: Dr. Stanford Breed  HPI:  63 y.o. male with a past medical history significant for CAD status post CABG (2000).His most recent nuclear stress test from 2013 shows an old large anteroapical scar with no significant ischemia and an ejection fraction of 41%. He has a history of systolic heart failure secondary to ischemic cardiomyopathy (EF 35-40%) , this had been stable. Other problems include obesity, diabetes mellitus, systemic hypertension, hyperlipidemia, obstructive sleep apnea, and Parkinson's disease with a deep brain stimulator. He presented with dyspnea 05/12/13 to emergency room at [facility]. He had been on a fishing trip and had missed some Lasix doses and had dined on a large amounts of seafood . He was admitted, given IV Lasix and diuresed. He was discharged on his usual home meds. Prior to discharge he had an echo revealing EF of 25-30% which is a decrease from previous. With the change any was ordered a [employer] he is back for results today.  LexiScan Myoview is low risk study there is an old apical, anterior apical and anterior septal infarct but no reversible ischemia EF 41%. He had distal anterior septal and apical hypokinesia.  Overall patient feels better he still short of breath more with talking than with walking but not as severe as prior to hospitalization. He is using his CPAP at least one to 2 hours at night he has to take it off the top frequently.  I did receive communication from Dr. Carles Collet that even though he has a deep brain stimulator.stat noted that he could still have an ICD if needed.   Allergies  Allergen Reactions  . Penicillins Rash    Because of a history of documented adverse serious drug reaction;Medi Alert bracelet  is recommended     Current Outpatient Prescriptions  Medication Sig Dispense Refill  . ADVAIR DISKUS 250-50 MCG/DOSE AEPB Inhale 1 puff into the lungs daily.       Marland Kitchen aspirin EC 81 MG tablet Take 81 mg by mouth daily.      . carbidopa-levodopa (SINEMET IR) 25-100 MG per tablet Take 1 tablet by mouth 3 (three) times daily.  90 tablet  5  . clonazePAM (KLONOPIN) 0.5 MG tablet Take 0.5 mg by mouth at bedtime.       . furosemide (LASIX) 80 MG tablet Take 120 mg by mouth daily.      Marland Kitchen glimepiride (AMARYL) 2 MG tablet Take 1-2 mg by mouth 2 (two) times daily. 2 mg every morning and 1 mg every evening      . glucose blood (ONE TOUCH ULTRA TEST) test strip Check blood sugar once a day  100 each  12  . ibuprofen (ADVIL,MOTRIN) 200 MG tablet Take 800 mg by mouth daily as needed for pain.      Marland Kitchen KLOR-CON M10 10 MEQ tablet Take 10 mEq by mouth daily.       Marland Kitchen levothyroxine (SYNTHROID, LEVOTHROID) 125 MCG tablet TAKE 1 TABLET EVERY DAY  30 tablet  2  . losartan (COZAAR) 50 MG tablet Take 50 mg by mouth at bedtime.      . metoprolol succinate (TOPROL-XL) 50 MG 24 hr tablet Take 50 mg by mouth at bedtime. Take with  or immediately following a meal.      . Multiple Vitamin (MULTIVITAMIN WITH MINERALS) TABS tablet Take 1 tablet by mouth daily. Antioxidant  Multi vitamin      . nitroGLYCERIN (NITROSTAT) 0.4 MG SL tablet Place 1 tablet (0.4 mg total) under the tongue every 5 (five) minutes as needed for chest pain.  25 tablet  2  . ONETOUCH DELICA LANCETS FINE MISC Use as directed  100 each  11  . potassium chloride (K-DUR) 10 MEQ tablet Take 30 mEq by mouth at bedtime.      . pramipexole (MIRAPEX) 0.5 MG tablet Take 1 tablet (0.5 mg total) by mouth 2 (two) times daily.  60 tablet  5  . rosuvastatin (CRESTOR) 10 MG tablet Take 20 mg by mouth daily.      . sitaGLIPtin-metformin (JANUMET) 50-1000 MG per tablet Take 1 tablet by mouth 2 (two) times daily with a meal.      . traMADol (ULTRAM) 50 MG tablet TAKE 1 TABLET TWICE A DAY AS  NEEDED FOR PAIN  30 tablet  0  . travoprost, benzalkonium, (TRAVATAN) 0.004 % ophthalmic solution Place 1 drop into the right eye at bedtime.       . trihexyphenidyl (ARTANE) 2 MG tablet Take 1 tablet (2 mg total) by mouth 3 (three) times daily with meals.  270 tablet  1  . VENTOLIN HFA 108 (90 BASE) MCG/ACT inhaler Inhale 1 puff into the lungs daily.       . vitamin B-12 (CYANOCOBALAMIN) 1000 MCG tablet Take 1,000 mcg by mouth daily.       No current facility-administered medications for this visit.    Past Medical History  Diagnosis Date  . Cardiomyopathy   . CAD (coronary artery disease)   . HTN (hypertension)     pt denies 08/19/12  . MI (myocardial infarction)   . Sleep apnea   . GERD (gastroesophageal reflux disease)   . Hyperthyroidism   . Hyperplasia, prostate   . Benign neoplasm of colon   . Nephrolithiasis   . Ventral hernia   . HLD (hyperlipidemia)   . Parkinson disease     1999  . OSA (obstructive sleep apnea)     AHI-28,on CPAP, noncompliant with CPAP  . Sleep apnea, organic   . Complication of anesthesia     pt states that he got a rash  . Arthritis   . Shortness of breath     Hx: of at all times  . UTI (lower urinary tract infection) 09/15/12    Klebsiella  . Asthma   . DM (diabetes mellitus)     TYPE 2     Past Surgical History  Procedure Laterality Date  . Coronary artery bypass graft  2000    [name], MD  . Coronary stent placement  1998  . Median sternotomy  2000  . Deep brain stimulator placement  2004    Right and left VIN stimulator placement   . Acoustic neuroma resection  1981  . Finger amputation      left pointer  . Cataract extraction w/ intraocular lens implant      Hx: of right eye  . Tonsillectomy    . Colonoscopy w/ biopsies and polypectomy      Hx: of  . Cardiac catheterization    . Subthalamic stimulator battery replacement N/A 09/05/2012    Procedure: Deep brain stimulator battery change;  Surgeon: Erline Levine, MD;   Location: [facility];  Service: Neurosurgery;  Laterality: N/A;  Deep brain stimulator battery change    HYI:FOYDXAJ:OI colds or fevers, weight down 6 pounds from his last visit with me Skin:no rashes or ulcers HEENT:no blurred vision, no congestion CV:see HPI PUL:see HPI GI:no diarrhea constipation or melena, no indigestion GU:no hematuria, no dysuria MS:no joint pain, no claudication Neuro:no syncope, no lightheadedness, meds adjusted per Dr. Carles Collet  Endo:+ diabetes up and down depending on his diet, no thyroid disease  Wt Readings from Last 3 Encounters:  07/04/13 270 lb (122.471 kg)  06/19/13 272 lb (123.378 kg)  06/17/13 271 lb (122.925 kg)   Was 276 on previous visit  PHYSICAL EXAM BP 120/60  Pulse 76  Ht 5\' 9"  (1.753 m)  Wt 270 lb (122.471 kg)  BMI 39.85 kg/m2 General:Pleasant affect, NAD Skin:Warm and dry, brisk capillary refill HEENT:normocephalic, sclera clear, mucus membranes moist Neck:supple, 1cm JVD, no bruits  Heart:S1S2 RRR without murmur, gallup, rub or click Lungs:clear without rales, rhonchi, or wheezes NOM:VEHMC, soft, non tender, + BS, do not palpate liver spleen or masses Ext:no lower ext edema, 2+ pedal pulses, 2+ radial pulses Neuro:alert and oriented, MAE, follows commands, + facial symmetry EKG:SR no acute changes from previous EKG  ASSESSMENT AND PLAN Cardiomyopathy, ischemic- EF 35-40% by echo 5/13, but 05/2013 EF now 25-30% Patient was volume overloaded with heart failure at the time of the most recent echo. Nuclear stress test was done which was negative for ischemia. EF on the extremity was 41%. Patient with some shortness of breath with exertion or talking but able to rest at night.  C A D- hx of CABG Negative LexiScan Myoview  DYSPNEA/SHORTNESS OF BREATH Stable    Will plan for echocardiogram in August to reevaluate his EF for possibility of ICD and he will followup in the end of August with Dr. Stanford Breed

## 2013-07-04 NOTE — Patient Instructions (Signed)
Follow up with Dr. Stanford Breed after echo in August.  Weigh daily  Today your weight is down 6 pounds from May Call [phone] if weight climbs more than 3 pounds in a day or 5 pounds in a week. No salt to very little salt in your diet.  No more than 2000 mg in a day. Call if increased shortness of breath or increased swelling.   continue to decrease salt in your diet, continue with CPAP

## 2013-07-06 ENCOUNTER — Encounter: Payer: Self-pay | Admitting: Cardiology

## 2013-07-06 NOTE — Assessment & Plan Note (Signed)
Negative LexiScan Myoview

## 2013-07-06 NOTE — Assessment & Plan Note (Signed)
Stable

## 2013-07-06 NOTE — Assessment & Plan Note (Signed)
Patient was volume overloaded with heart failure at the time of the most recent echo. Nuclear stress test was done which was negative for ischemia. EF on the extremity was 41%. Patient with some shortness of breath with exertion or talking but able to rest at night.

## 2013-07-08 ENCOUNTER — Other Ambulatory Visit: Payer: Self-pay | Admitting: Internal Medicine

## 2013-07-17 ENCOUNTER — Other Ambulatory Visit: Payer: Self-pay

## 2013-07-17 MED ORDER — TRAMADOL HCL 50 MG PO TABS: ORAL_TABLET | ORAL | Status: DC

## 2013-07-17 NOTE — Telephone Encounter (Signed)
OK X1 

## 2013-07-21 DIAGNOSIS — M5137 Other intervertebral disc degeneration, lumbosacral region: Secondary | ICD-10-CM | POA: Diagnosis not present

## 2013-07-21 DIAGNOSIS — M47817 Spondylosis without myelopathy or radiculopathy, lumbosacral region: Secondary | ICD-10-CM | POA: Diagnosis not present

## 2013-07-21 DIAGNOSIS — IMO0002 Reserved for concepts with insufficient information to code with codable children: Secondary | ICD-10-CM | POA: Diagnosis not present

## 2013-07-22 ENCOUNTER — Telehealth: Payer: Self-pay | Admitting: Cardiology

## 2013-07-22 NOTE — Telephone Encounter (Signed)
Called patient to schedule appt with kristin to review medications per staff msg from [place].  Pt states he had it done at [facility] but will call back if he has questions.

## 2013-07-23 DIAGNOSIS — M5137 Other intervertebral disc degeneration, lumbosacral region: Secondary | ICD-10-CM | POA: Diagnosis not present

## 2013-07-23 DIAGNOSIS — M47817 Spondylosis without myelopathy or radiculopathy, lumbosacral region: Secondary | ICD-10-CM | POA: Diagnosis not present

## 2013-07-23 DIAGNOSIS — IMO0002 Reserved for concepts with insufficient information to code with codable children: Secondary | ICD-10-CM | POA: Diagnosis not present

## 2013-07-28 ENCOUNTER — Telehealth: Payer: Self-pay

## 2013-07-28 NOTE — Telephone Encounter (Signed)
Diabetic bundle  mychart message sent.  Orders for Lipid and A1C already in system

## 2013-08-02 ENCOUNTER — Other Ambulatory Visit: Payer: Self-pay | Admitting: Internal Medicine

## 2013-08-04 ENCOUNTER — Encounter: Payer: Self-pay | Admitting: Podiatry

## 2013-08-04 ENCOUNTER — Ambulatory Visit (INDEPENDENT_AMBULATORY_CARE_PROVIDER_SITE_OTHER): Payer: Federal, State, Local not specified - PPO | Admitting: Podiatry

## 2013-08-04 DIAGNOSIS — M7750 Other enthesopathy of unspecified foot: Secondary | ICD-10-CM

## 2013-08-04 DIAGNOSIS — M779 Enthesopathy, unspecified: Secondary | ICD-10-CM

## 2013-08-04 DIAGNOSIS — I2510 Atherosclerotic heart disease of native coronary artery without angina pectoris: Secondary | ICD-10-CM

## 2013-08-04 NOTE — Progress Notes (Signed)
Subjective:     Patient ID: Henry [name], male   DOB: [birth year], 63 y.o.   MRN: [id number]  Diabetes   patient presents and poor health who has long-term issues with his feet and pain when he tries to walk   Review of Systems  All other systems reviewed and are negative.      Objective:   Physical Exam  Nursing note and vitals reviewed. Constitutional: He is oriented to person, place, and time.  Cardiovascular: Intact distal pulses.   Musculoskeletal: Normal range of motion.  Neurological: He is oriented to person, place, and time.  Skin: Skin is warm.   neurovascular status intact with muscle strength reduced and range of motion subtalar and midtarsal joint reduced. Equinus condition noted and discomfort in the forefoot around the metatarsal phalangeal joint and into the tendon group of the posterior tib both feet was noted. Patient does have history of Parkinson's but is not shake significantly at this time I noted digits to be well perfused and arch height to be moderately elevated     Assessment:     Chronic structural changes with long-term obesity Parkinson's disease and diabetes as complicating factors    Plan:     H&P reviewed and I recommended new orthotics to try to reduce stress against his feet. Patient is scanned for custom orthotic devices at this time

## 2013-08-04 NOTE — Progress Notes (Signed)
   Subjective:    Patient ID: Henry [name], male    DOB: 07-18-1950, 63 y.o.   MRN: [id number]  HPI Comments: "I need my feet checked out and new orthotics"  Patient states that he is diabetic and would like his feet checked. Also, requesting a new pair of orthotics.  Diabetes      Review of Systems  All other systems reviewed and are negative.      Objective:   Physical Exam        Assessment & Plan:

## 2013-08-04 NOTE — Telephone Encounter (Signed)
OK X1 Label prn only, not maintenance

## 2013-08-06 ENCOUNTER — Other Ambulatory Visit (INDEPENDENT_AMBULATORY_CARE_PROVIDER_SITE_OTHER): Payer: Federal, State, Local not specified - PPO

## 2013-08-06 DIAGNOSIS — E890 Postprocedural hypothyroidism: Secondary | ICD-10-CM

## 2013-08-06 DIAGNOSIS — E785 Hyperlipidemia, unspecified: Secondary | ICD-10-CM

## 2013-08-06 DIAGNOSIS — E1159 Type 2 diabetes mellitus with other circulatory complications: Secondary | ICD-10-CM

## 2013-08-06 LAB — LIPID PANEL
Cholesterol: 151 mg/dL (ref 0–200)
HDL: 37.2 mg/dL — ABNORMAL LOW (ref >39.00)
NONHDL: 113.8
Total CHOL/HDL Ratio: 4
Triglycerides: 262 mg/dL — ABNORMAL HIGH (ref 0.0–149.0)
VLDL: 52.4 mg/dL — AB (ref 0.0–40.0)

## 2013-08-06 LAB — TSH: TSH: 10.27 u[IU]/mL — ABNORMAL HIGH (ref 0.35–4.50)

## 2013-08-06 LAB — HEMOGLOBIN A1C: HEMOGLOBIN A1C: 8.7 % — AB (ref 4.6–6.5)

## 2013-08-06 LAB — LDL CHOLESTEROL, DIRECT: LDL DIRECT: 88.7 mg/dL

## 2013-08-13 ENCOUNTER — Ambulatory Visit (HOSPITAL_COMMUNITY)
Admission: RE | Admit: 2013-08-13 | Discharge: 2013-08-13 | Disposition: A | Discharge status: Home / Self Care | Payer: Federal, State, Local not specified - PPO | Source: Ambulatory Visit | Attending: Cardiology | Admitting: Cardiology

## 2013-08-13 DIAGNOSIS — R0609 Other forms of dyspnea: Secondary | ICD-10-CM | POA: Insufficient documentation

## 2013-08-13 DIAGNOSIS — I2589 Other forms of chronic ischemic heart disease: Secondary | ICD-10-CM | POA: Insufficient documentation

## 2013-08-13 DIAGNOSIS — I255 Ischemic cardiomyopathy: Secondary | ICD-10-CM

## 2013-08-13 DIAGNOSIS — R0989 Other specified symptoms and signs involving the circulatory and respiratory systems: Secondary | ICD-10-CM | POA: Diagnosis present

## 2013-08-13 DIAGNOSIS — I517 Cardiomegaly: Secondary | ICD-10-CM

## 2013-08-13 DIAGNOSIS — R0602 Shortness of breath: Secondary | ICD-10-CM

## 2013-08-13 NOTE — Progress Notes (Signed)
2D Echo Performed 08/13/2013    Marygrace Drought, RCS

## 2013-08-15 ENCOUNTER — Telehealth: Payer: Self-pay | Admitting: *Deleted

## 2013-08-15 NOTE — Telephone Encounter (Signed)
Left message for patient about his Echo result, told patient to give [place] a call if there be anymore questions, and to please keep appointment with Dr Stanford Breed

## 2013-08-15 NOTE — Telephone Encounter (Signed)
Message copied by Vennie Homans on Fri Aug 15, 2013  8:21 AM ------      Message from: Isaiah Serge      Created: Wed Aug 13, 2013  6:09 PM       Please notify pt that echo somewhat improved.  Keep follow up with Dr. Stanford Breed ------

## 2013-08-18 ENCOUNTER — Other Ambulatory Visit: Payer: Self-pay | Admitting: Internal Medicine

## 2013-08-18 ENCOUNTER — Ambulatory Visit (INDEPENDENT_AMBULATORY_CARE_PROVIDER_SITE_OTHER): Payer: Federal, State, Local not specified - PPO | Admitting: Internal Medicine

## 2013-08-18 ENCOUNTER — Encounter: Payer: Self-pay | Admitting: Internal Medicine

## 2013-08-18 VITALS — BP 115/72 | HR 71 | Temp 98.1°F | Wt 272.4 lb

## 2013-08-18 DIAGNOSIS — E785 Hyperlipidemia, unspecified: Secondary | ICD-10-CM | POA: Diagnosis not present

## 2013-08-18 DIAGNOSIS — I10 Essential (primary) hypertension: Secondary | ICD-10-CM | POA: Diagnosis not present

## 2013-08-18 DIAGNOSIS — I2510 Atherosclerotic heart disease of native coronary artery without angina pectoris: Secondary | ICD-10-CM

## 2013-08-18 DIAGNOSIS — E1159 Type 2 diabetes mellitus with other circulatory complications: Secondary | ICD-10-CM

## 2013-08-18 DIAGNOSIS — E039 Hypothyroidism, unspecified: Secondary | ICD-10-CM | POA: Insufficient documentation

## 2013-08-18 DIAGNOSIS — E890 Postprocedural hypothyroidism: Secondary | ICD-10-CM | POA: Diagnosis not present

## 2013-08-18 MED ORDER — LEVOTHYROXINE SODIUM 125 MCG PO TABS: 125.0000 ug | ORAL_TABLET | Freq: Every day | ORAL | Status: DC

## 2013-08-18 NOTE — Progress Notes (Signed)
   Subjective:    Patient ID: Henry [name], male    DOB: 10/12/1950, 63 y.o.   MRN: [id number]  HPI  He is here to followup on his diabetes, dyslipidemia, and hypothyroidism.  His TSH was 10.27. He is on Synthroid 125 mcg everyday except for half a pill on Tuesdays and Thursdays. He has been taking it with his Parkinson's medicines.   A1c is 8.7% which would be associated with an average sugar of 231 and an increase long term risk of 72%.This was discussed with him & his wife  His triglycerides have increased from 220 to262. He eats out frequently.   His fasting glucoses average 225. He is not checking two-hour postprandial sugars. He denies any hypoglycemia. He has polyuria only when he takes his furosemide.  His LDL is at minimal goal of 88.   Review of Systems  Polyuria, polyphagia, polydipsia absent. There is no blurred vision, double vision, or loss of vision.  Also denied are numbness, tingling, or burning of the extremities. No nonhealing skin lesions present.    Chest pain, palpitations, tachycardia, exertional dyspnea, paroxysmal nocturnal dyspnea, claudication or edema are absent.        Objective:   Physical Exam  Pertinent or positive physical findings include: Exhibits mask facies of Parkinson's His voice is gravelly. He has an S4 without murmurs or gallops. Abdomen is protuberant with ventral hernia Trace edema at the ankles He has flexion contracture of the fifth right digit. 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   General appearance :adequately nourished; in no distress. Eyes: No conjunctival inflammation or scleral icterus is present. Oral exam: Dental hygiene is good. Lips and gums are healthy appearing.There is no oropharyngeal erythema or exudate noted.  Heart:  Normal rate and regular rhythm. S1 and S2 normal without gallop, murmur, click,or rub.   Lungs:Chest clear to auscultation; no  wheezes, rhonchi,rales ,or rubs present.No increased work of breathing.  Abdomen: bowel sounds normal, soft and non-tender without masses or organomegaly.  No guarding or rebound.  Skin:Warm & dry.  Intact without suspicious lesions or rashes ; no jaundice or tenting Lymphatic: No lymphadenopathy is noted about the head, neck, axilla           Assessment & Plan:  See Current Assessment & Plan in Problem List under specific Diagnosis

## 2013-08-18 NOTE — Progress Notes (Signed)
Pre visit review using our clinic review tool, if applicable. No additional management support is needed unless otherwise documented below in the visit note. 

## 2013-08-18 NOTE — Patient Instructions (Addendum)
The most common cause of elevated triglycerides (TG) is the ingestion of sugar from high fructose corn syrup sources added to processed foods & drinks.  Eat a low-fat diet with lots of fruits and vegetables, up to 7-9 servings per day. Consume less than 40  Grams (preferably ZERO) of sugar per day from foods & drinks with High Fructose Corn Syrup (HFCS) sugar as #1,2,3 or # 4 on label.[employer], [employer] do not carry products with HFCS. Follow a  low carb nutrition program such as [place] or The [employer] Sugar [employer]usters  to prevent Diabetes progression . White carbohydrates (potatoes, rice, bread, and pasta) have a high spike of sugar and a high load of sugar. For example a  baked potato has a cup of sugar and a  french fry  2 teaspoons of sugar. Yams, wild  rice, whole grained bread &  wheat pasta have been much lower spike and load of  sugar. Portions should be the size of a deck of cards or your palm. Take thyroid by itself without other medications or food

## 2013-08-19 NOTE — Assessment & Plan Note (Signed)
Blood pressure goals reviewed.  

## 2013-08-19 NOTE — Assessment & Plan Note (Addendum)
Endocrine referral if A1c not < 8% in 3 mos with correction of hypothyroidism & TLC interventions

## 2013-08-19 NOTE — Telephone Encounter (Signed)
OK X1, R X1

## 2013-08-19 NOTE — Assessment & Plan Note (Signed)
See nutritional (TLC) recommendations in AVS

## 2013-08-19 NOTE — Assessment & Plan Note (Signed)
Change back to 125 mcg daily; TSH in 3 mos

## 2013-08-19 NOTE — Telephone Encounter (Signed)
Patient was seen yesterday.

## 2013-08-20 ENCOUNTER — Other Ambulatory Visit: Payer: Self-pay

## 2013-08-20 MED ORDER — METOPROLOL SUCCINATE ER 50 MG PO TB24: 50.0000 mg | ORAL_TABLET | Freq: Every day | ORAL | Status: DC

## 2013-08-26 DIAGNOSIS — M62838 Other muscle spasm: Secondary | ICD-10-CM | POA: Diagnosis not present

## 2013-08-26 DIAGNOSIS — S335XXA Sprain of ligaments of lumbar spine, initial encounter: Secondary | ICD-10-CM | POA: Diagnosis not present

## 2013-08-26 DIAGNOSIS — M47817 Spondylosis without myelopathy or radiculopathy, lumbosacral region: Secondary | ICD-10-CM | POA: Diagnosis not present

## 2013-08-28 ENCOUNTER — Ambulatory Visit (INDEPENDENT_AMBULATORY_CARE_PROVIDER_SITE_OTHER): Payer: Federal, State, Local not specified - PPO | Admitting: Cardiology

## 2013-08-28 ENCOUNTER — Encounter: Payer: Self-pay | Admitting: Cardiology

## 2013-08-28 VITALS — BP 126/86 | HR 76 | Ht 69.0 in | Wt 271.0 lb

## 2013-08-28 DIAGNOSIS — I5043 Acute on chronic combined systolic (congestive) and diastolic (congestive) heart failure: Secondary | ICD-10-CM

## 2013-08-28 DIAGNOSIS — I509 Heart failure, unspecified: Secondary | ICD-10-CM

## 2013-08-28 DIAGNOSIS — E785 Hyperlipidemia, unspecified: Secondary | ICD-10-CM | POA: Diagnosis not present

## 2013-08-28 DIAGNOSIS — I255 Ischemic cardiomyopathy: Secondary | ICD-10-CM

## 2013-08-28 DIAGNOSIS — I2510 Atherosclerotic heart disease of native coronary artery without angina pectoris: Secondary | ICD-10-CM

## 2013-08-28 DIAGNOSIS — I2589 Other forms of chronic ischemic heart disease: Secondary | ICD-10-CM | POA: Diagnosis not present

## 2013-08-28 DIAGNOSIS — I10 Essential (primary) hypertension: Secondary | ICD-10-CM

## 2013-08-28 DIAGNOSIS — G20 Parkinson's disease: Secondary | ICD-10-CM

## 2013-08-28 MED ORDER — SPIRONOLACTONE 25 MG PO TABS: 25.0000 mg | ORAL_TABLET | Freq: Every day | ORAL | Status: DC

## 2013-08-28 NOTE — Patient Instructions (Signed)
Your physician recommends that you schedule a follow-up appointment in: 3 MONTHS WITH DR CRENSHAW  REFERRAL TO ELECTROPHYSIOLOGY AT [place] [facility]  START SPIRONOLACTONE 25 MG ONCE DAILY  Your physician recommends that you return for lab work in: [place]

## 2013-08-28 NOTE — Assessment & Plan Note (Signed)
Blood pressure controlled. Continue present medications. 

## 2013-08-28 NOTE — Assessment & Plan Note (Signed)
Mildly volume overloaded on examination. Add spironolactone 25 mg daily. Continue present dose of Lasix. Check potassium and renal function in one week. And

## 2013-08-28 NOTE — Assessment & Plan Note (Signed)
Continue statin. Did not tolerate higher doses of Crestor.

## 2013-08-28 NOTE — Progress Notes (Signed)
HPI: FU coronary artery disease status post bypass and graft, Parkinson's, diabetes and hypertension as well as hyperlipidemia. Carotid Dopplers in January 2005 showed 0-39% stenosis. Admitted 5/15 with CHF which improved with diuresis. Echocardiogram in 2005 showed grossly normal LV function but technically difficult images. Last nuclear study 6/15 showed EF 41, prior MI; no ischemia. Echo 8/15 showed EF 30-35, moderate LAE. TSH 7/15 10.27. Since last seen, He notes some dyspnea on exertion. No orthopnea or PND. Mild pedal edema. Mild tightness when he experiences dyspnea but no exertional chest pain.   Current Outpatient Prescriptions  Medication Sig Dispense Refill  . ADVAIR DISKUS 250-50 MCG/DOSE AEPB Inhale 1 puff into the lungs daily.       Marland Kitchen aspirin EC 81 MG tablet Take 81 mg by mouth daily.      . carbidopa-levodopa (SINEMET IR) 25-100 MG per tablet Take 1 tablet by mouth 3 (three) times daily.  90 tablet  5  . clonazePAM (KLONOPIN) 0.5 MG tablet Take 0.5 mg by mouth at bedtime.       . furosemide (LASIX) 80 MG tablet Take 120 mg by mouth daily.      Marland Kitchen glimepiride (AMARYL) 2 MG tablet TAKE 1 TABLET BY MOUTH IN THE MORNING AND 1/2 TABLET AT NIGHT  135 tablet  1  . glucose blood (ONE TOUCH ULTRA TEST) test strip Check blood sugar once a day  100 each  12  . ibuprofen (ADVIL,MOTRIN) 200 MG tablet Take 800 mg by mouth daily as needed for pain.      Marland Kitchen levothyroxine (SYNTHROID, LEVOTHROID) 125 MCG tablet Take 1 tablet (125 mcg total) by mouth daily before breakfast.  90 tablet  1  . losartan (COZAAR) 50 MG tablet Take 50 mg by mouth at bedtime.      . metoprolol succinate (TOPROL-XL) 50 MG 24 hr tablet Take 1 tablet (50 mg total) by mouth at bedtime. Take with or immediately following a meal.  30 tablet  06  . Multiple Vitamin (MULTIVITAMIN WITH MINERALS) TABS tablet Take 1 tablet by mouth daily. Antioxidant  Multi vitamin      . nitroGLYCERIN (NITROSTAT) 0.4 MG SL tablet Place 1 tablet  (0.4 mg total) under the tongue every 5 (five) minutes as needed for chest pain.  25 tablet  2  . ONETOUCH DELICA LANCETS FINE MISC Use as directed  100 each  11  . potassium chloride (K-DUR) 10 MEQ tablet Take 30 mEq by mouth at bedtime.      . pramipexole (MIRAPEX) 0.5 MG tablet Take 1 tablet (0.5 mg total) by mouth 2 (two) times daily.  60 tablet  5  . rosuvastatin (CRESTOR) 10 MG tablet Take 20 mg by mouth daily.      . sitaGLIPtin-metformin (JANUMET) 50-1000 MG per tablet Take 1 tablet by mouth 2 (two) times daily with a meal.      . traMADol (ULTRAM) 50 MG tablet TAKE 1 TABLET BY MOUTH TWICE A DAY AS NEEDED  30 tablet  1  . travoprost, benzalkonium, (TRAVATAN) 0.004 % ophthalmic solution Place 1 drop into the right eye at bedtime.       . trihexyphenidyl (ARTANE) 2 MG tablet Take 1 tablet (2 mg total) by mouth 3 (three) times daily with meals.  270 tablet  1  . VENTOLIN HFA 108 (90 BASE) MCG/ACT inhaler Inhale 1 puff into the lungs daily.       . vitamin B-12 (CYANOCOBALAMIN) 1000 MCG tablet Take 1,000  mcg by mouth daily.       No current facility-administered medications for this visit.     Past Medical History  Diagnosis Date  . Cardiomyopathy   . CAD (coronary artery disease)   . HTN (hypertension)     pt denies 08/19/12  . MI (myocardial infarction)   . Sleep apnea   . GERD (gastroesophageal reflux disease)   . Hyperthyroidism   . Hyperplasia, prostate   . Benign neoplasm of colon   . Nephrolithiasis   . Ventral hernia   . HLD (hyperlipidemia)   . Parkinson disease     1999  . OSA (obstructive sleep apnea)     AHI-28,on CPAP, noncompliant with CPAP  . Sleep apnea, organic   . Complication of anesthesia     pt states that he got a rash  . Arthritis   . Shortness of breath     Hx: of at all times  . UTI (lower urinary tract infection) 09/15/12    Klebsiella  . Asthma   . DM (diabetes mellitus)     TYPE 2     Past Surgical History  Procedure Laterality Date  .  Coronary artery bypass graft  2000    [name], MD  . Coronary stent placement  1998  . Median sternotomy  2000  . Deep brain stimulator placement  2004    Right and left VIN stimulator placement   . Acoustic neuroma resection  1981  . Finger amputation      left pointer  . Cataract extraction w/ intraocular lens implant      Hx: of right eye  . Tonsillectomy    . Colonoscopy w/ biopsies and polypectomy      Hx: of  . Cardiac catheterization    . Subthalamic stimulator battery replacement N/A 09/05/2012    Procedure: Deep brain stimulator battery change;  Surgeon: Erline Levine, MD;  Location: [facility];  Service: Neurosurgery;  Laterality: N/A;  Deep brain stimulator battery change    History   Social History  . Marital Status: Married    Spouse Name: [name]    Number of Children: 2  . Years of Education: N/A   Occupational History  . 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 Smoking status: Never Smoker   . Smokeless tobacco: Never Used  . Alcohol Use: Yes     Comment: occasional wine  . Drug Use: No  . Sexual Activity: Not on file   Other Topics Concern  . Not on file   Social History Narrative  . No narrative on file    ROS: no fevers or chills, productive cough, hemoptysis, dysphasia, odynophagia, melena, hematochezia, dysuria, hematuria, rash, seizure activity, orthopnea, PND, pedal edema, claudication. Remaining systems are negative.  Physical Exam: Well-developed well-nourished in no acute distress.  Skin is warm and dry.  HEENT is normal.  Neck is supple.  Chest is clear to auscultation with normal expansion.  Cardiovascular exam is regular rate and rhythm.  Abdominal exam nontender or distended. No masses palpated. Extremities show trace to 1+ edema. Neuro-Parkinson's  ECG 07/04/2013-sinus rhythm, anterior lateral T-wave inversion.

## 2013-08-28 NOTE — Assessment & Plan Note (Addendum)
Ejection fraction 30-35% on most recent echo. Continue ARB and beta blocker. Add spironolactone 25 mg daily. Check potassium and renal function in one week. Referred to electrophysiology for consideration of ICD.

## 2013-08-28 NOTE — Assessment & Plan Note (Signed)
Continue aspirin and statin. 

## 2013-08-28 NOTE — Assessment & Plan Note (Signed)
Note patient does have a deep brain stimulator battery in the right chest.

## 2013-08-29 ENCOUNTER — Ambulatory Visit (INDEPENDENT_AMBULATORY_CARE_PROVIDER_SITE_OTHER): Payer: Federal, State, Local not specified - PPO | Admitting: *Deleted

## 2013-08-29 DIAGNOSIS — M779 Enthesopathy, unspecified: Secondary | ICD-10-CM

## 2013-08-29 NOTE — Patient Instructions (Signed)

## 2013-08-29 NOTE — Progress Notes (Signed)
   Subjective:    Patient ID: Henry [name], male    DOB: January 20, 1950, 63 y.o.   MRN: [id number]  HPI PUO AND GIVEN INSTRUCTION.    Review of Systems     Objective:   Physical Exam        Assessment & Plan:

## 2013-09-01 ENCOUNTER — Other Ambulatory Visit: Payer: Self-pay | Admitting: Neurology

## 2013-09-02 ENCOUNTER — Other Ambulatory Visit: Payer: Self-pay

## 2013-09-02 MED ORDER — PRAMIPEXOLE DIHYDROCHLORIDE 0.5 MG PO TABS
0.5000 mg | ORAL_TABLET | Freq: Three times a day (TID) | ORAL | Status: DC
Start: 2013-09-02 — End: 2013-10-14

## 2013-09-06 DIAGNOSIS — M545 Low back pain, unspecified: Secondary | ICD-10-CM | POA: Diagnosis not present

## 2013-09-06 DIAGNOSIS — M25559 Pain in unspecified hip: Secondary | ICD-10-CM | POA: Diagnosis not present

## 2013-09-18 ENCOUNTER — Ambulatory Visit (INDEPENDENT_AMBULATORY_CARE_PROVIDER_SITE_OTHER): Payer: Federal, State, Local not specified - PPO | Admitting: Internal Medicine

## 2013-09-18 ENCOUNTER — Encounter: Payer: Self-pay | Admitting: Internal Medicine

## 2013-09-18 VITALS — BP 112/68 | HR 81 | Ht 68.5 in | Wt 265.8 lb

## 2013-09-18 DIAGNOSIS — I255 Ischemic cardiomyopathy: Secondary | ICD-10-CM

## 2013-09-18 DIAGNOSIS — I5043 Acute on chronic combined systolic (congestive) and diastolic (congestive) heart failure: Secondary | ICD-10-CM

## 2013-09-18 DIAGNOSIS — I509 Heart failure, unspecified: Secondary | ICD-10-CM

## 2013-09-18 DIAGNOSIS — I2589 Other forms of chronic ischemic heart disease: Secondary | ICD-10-CM

## 2013-09-18 NOTE — Assessment & Plan Note (Addendum)
The patient has worsening LV dysfunction with an EF of 25-30% by echo. I have recommended ICD implant for primary prevention of sudden death. As his symptoms of dyspnea are worsening, and because his EF is worsening and because he tells me he has not had a heart cath since bypass in 2000, I think he needs repeat coronary angiography.  I plan to discuss with his primary cardiologist Dr. Stanford Breed. The patient is on optimal medications.   Addendum: I discussed the above with Dr. Stanford Breed who concurs that proceeding with catheterization (right and left) would be very reasonable.

## 2013-09-18 NOTE — Patient Instructions (Signed)
Your physician recommends that you schedule a follow-up appointment as needed with Dr Lovena Le as scheduled with Dr Stanford Breed   Will call you after Dr Lovena Le talks to Dr Stanford Breed

## 2013-09-18 NOTE — Assessment & Plan Note (Signed)
I suspect his symptoms are multifactorial and he is really class 3. He is on optimal medications. Right heart cath might be helpful.

## 2013-09-18 NOTE — Progress Notes (Signed)
HPI [name] is referred today by Dr. Stanford Breed to consider ICD implant. 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 He states that he has not had a repeat cath since his bypass. He has had increasing sob, chest pressure and weakness. He has sleep apnea but does not wear his cpap. He has dyspnea with any exertion. He noted that when he had his bypass, he did not have chest pressure but sob. By echo his EF has varied from 25% to 35%.  Allergies  Allergen Reactions  . Penicillins Rash    Because of a history of documented adverse serious drug reaction;Medi Alert bracelet  is recommended     Current Outpatient Prescriptions  Medication Sig Dispense Refill  . ADVAIR DISKUS 250-50 MCG/DOSE AEPB Inhale 1 puff into the lungs daily.       Marland Kitchen aspirin EC 81 MG tablet Take 81 mg by mouth daily.      . carbidopa-levodopa (SINEMET IR) 25-100 MG per tablet Take 1 tablet by mouth 3 (three) times daily.  90 tablet  5  . clonazePAM (KLONOPIN) 0.5 MG tablet Take 0.5 mg by mouth at bedtime.       . furosemide (LASIX) 80 MG tablet Take 120 mg by mouth daily.      Marland Kitchen glimepiride (AMARYL) 2 MG tablet TAKE 1 TABLET BY MOUTH IN THE MORNING AND 1/2 TABLET AT NIGHT  135 tablet  1  . glucose blood (ONE TOUCH ULTRA TEST) test strip Check blood sugar once a day  100 each  12  . ibuprofen (ADVIL,MOTRIN) 200 MG tablet Take 800 mg by mouth daily as needed for pain.      Marland Kitchen levothyroxine (SYNTHROID, LEVOTHROID) 125 MCG tablet Take 1 tablet (125 mcg total) by mouth daily before breakfast.  90 tablet  1  . losartan (COZAAR) 50 MG tablet Take 50 mg by mouth at bedtime.      . metoprolol succinate (TOPROL-XL) 50 MG 24 hr tablet Take 1 tablet (50 mg total) by mouth at bedtime. Take with or immediately following a meal.  30 tablet  06  . Multiple Vitamin (MULTIVITAMIN WITH MINERALS) TABS tablet Take 1 tablet by mouth daily.  Antioxidant  Multi vitamin      . nitroGLYCERIN (NITROSTAT) 0.4 MG SL tablet Place 1 tablet (0.4 mg total) under the tongue every 5 (five) minutes as needed for chest pain.  25 tablet  2  . ONETOUCH DELICA LANCETS FINE MISC Use as directed  100 each  11  . potassium chloride (K-DUR) 10 MEQ tablet Take 30 mEq by mouth at bedtime.      . pramipexole (MIRAPEX) 0.5 MG tablet Take 1 tablet (0.5 mg total) by mouth 3 (three) times daily.  90 tablet  5  . rosuvastatin (CRESTOR) 10 MG tablet Take 20 mg by mouth daily.      . sitaGLIPtin-metformin (JANUMET) 50-1000 MG per tablet Take 1 tablet by mouth 2 (two) times daily with a meal.      . spironolactone (ALDACTONE) 25 MG tablet Take 1 tablet (25 mg total) by mouth daily.  90 tablet  3  . traMADol (ULTRAM) 50 MG tablet TAKE 1 TABLET BY MOUTH TWICE A DAY AS NEEDED FOR PAIN      . travoprost, benzalkonium, (TRAVATAN) 0.004 % ophthalmic solution Place 1 drop into the right eye at bedtime.       Marland Kitchen  trihexyphenidyl (ARTANE) 2 MG tablet Take 1 tablet (2 mg total) by mouth 3 (three) times daily with meals.  270 tablet  1  . VENTOLIN HFA 108 (90 BASE) MCG/ACT inhaler Inhale 1 puff into the lungs daily.       . vitamin B-12 (CYANOCOBALAMIN) 1000 MCG tablet Take 1,000 mcg by mouth daily.       No current facility-administered medications for this visit.     Past Medical History  Diagnosis Date  . Cardiomyopathy   . CAD (coronary artery disease)   . HTN (hypertension)     pt denies 08/19/12  . MI (myocardial infarction)   . Sleep apnea   . GERD (gastroesophageal reflux disease)   . Hyperthyroidism   . Hyperplasia, prostate   . Benign neoplasm of colon   . Nephrolithiasis   . Ventral hernia   . HLD (hyperlipidemia)   . Parkinson disease     1999  . OSA (obstructive sleep apnea)     AHI-28,on CPAP, noncompliant with CPAP  . Sleep apnea, organic   . Complication of anesthesia     pt states that he got a rash  . Arthritis   . Shortness of breath      Hx: of at all times  . UTI (lower urinary tract infection) 09/15/12    Klebsiella  . Asthma   . DM (diabetes mellitus)     TYPE 2     ROS:   All systems reviewed and negative except as noted in the HPI.   Past Surgical History  Procedure Laterality Date  . Coronary artery bypass graft  2000    [name], MD  . Coronary stent placement  1998  . Median sternotomy  2000  . Deep brain stimulator placement  2004    Right and left VIN stimulator placement   . Acoustic neuroma resection  1981  . Finger amputation      left pointer  . Cataract extraction w/ intraocular lens implant      Hx: of right eye  . Tonsillectomy    . Colonoscopy w/ biopsies and polypectomy      Hx: of  . Cardiac catheterization    . Subthalamic stimulator battery replacement N/A 09/05/2012    Procedure: Deep brain stimulator battery change;  Surgeon: Erline Levine, MD;  Location: [facility];  Service: Neurosurgery;  Laterality: N/A;  Deep brain stimulator battery change     Family History  Problem Relation Age of Onset  . Peripheral vascular disease    . Arthritis    . Diabetes    . Alcoholism Mother 81    deceased   . Aneurysm Father 45  . Autoimmune disease Brother 76    AIDS     History   Social History  . Marital Status: Married    Spouse Name: [name]    Number of Children: 2  . Years of Education: N/A   Occupational History  . 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 Smoking status: Never Smoker   . Smokeless tobacco: Never Used  . Alcohol Use: Yes     Comment: occasional wine  . Drug Use: No  . Sexual Activity: Not on file   Other Topics Concern  . Not on file   Social History Narrative  . No narrative on file     BP 112/68  Pulse 81  Ht 5' 8.5" (1.74 m)  Wt 265 lb 12.8  oz (120.566 kg)  BMI 39.82 kg/m2  SpO2 88%  Physical Exam:  Well appearing NAD HEENT: Unremarkable Neck:  No JVD, no thyromegally Lymphatics:  No  adenopathy Back:  No CVA tenderness Lungs:  Clear HEART:  Regular rate rhythm, no murmurs, no rubs, no clicks Abd:  soft, positive bowel sounds, no organomegally, no rebound, no guarding Ext:  2 plus pulses, no edema, no cyanosis, no clubbing Skin:  No rashes no nodules Neuro:  CN II through XII intact, motor grossly intact   Assess/Plan:

## 2013-09-19 ENCOUNTER — Telehealth: Payer: Self-pay | Admitting: Internal Medicine

## 2013-09-19 ENCOUNTER — Other Ambulatory Visit: Payer: Self-pay

## 2013-09-19 DIAGNOSIS — I5020 Unspecified systolic (congestive) heart failure: Secondary | ICD-10-CM

## 2013-09-19 DIAGNOSIS — Z01812 Encounter for preprocedural laboratory examination: Secondary | ICD-10-CM

## 2013-09-19 DIAGNOSIS — I2510 Atherosclerotic heart disease of native coronary artery without angina pectoris: Secondary | ICD-10-CM

## 2013-09-19 DIAGNOSIS — I255 Ischemic cardiomyopathy: Secondary | ICD-10-CM

## 2013-09-19 MED ORDER — ATORVASTATIN CALCIUM 40 MG PO TABS
40.0000 mg | ORAL_TABLET | Freq: Every day | ORAL | Status: DC
Start: 2013-09-19 — End: 2014-09-21

## 2013-09-19 NOTE — Telephone Encounter (Signed)
Pt came by to inform Dr Linna Darner that his insurance is not currently covering the Rx for [place]. The alternative meds that would be provided at cheaper rates are the following: Atorvastatin, fluvastatin, lovastatin, pravastatin, simvastatin.  Please contact pt when request is reviewed.

## 2013-09-19 NOTE — Telephone Encounter (Signed)
Called the patient left a detailed message of change.

## 2013-09-19 NOTE — Telephone Encounter (Signed)
[place] for change to lipitor 40 qd - done erx  Please inform pt

## 2013-09-22 ENCOUNTER — Other Ambulatory Visit (INDEPENDENT_AMBULATORY_CARE_PROVIDER_SITE_OTHER): Payer: Federal, State, Local not specified - PPO

## 2013-09-22 ENCOUNTER — Encounter (HOSPITAL_COMMUNITY): Payer: Self-pay | Admitting: Pharmacy Technician

## 2013-09-22 DIAGNOSIS — I2510 Atherosclerotic heart disease of native coronary artery without angina pectoris: Secondary | ICD-10-CM | POA: Diagnosis not present

## 2013-09-22 DIAGNOSIS — M25559 Pain in unspecified hip: Secondary | ICD-10-CM | POA: Diagnosis not present

## 2013-09-22 DIAGNOSIS — Z01812 Encounter for preprocedural laboratory examination: Secondary | ICD-10-CM | POA: Diagnosis not present

## 2013-09-22 DIAGNOSIS — I2589 Other forms of chronic ischemic heart disease: Secondary | ICD-10-CM

## 2013-09-22 DIAGNOSIS — I255 Ischemic cardiomyopathy: Secondary | ICD-10-CM

## 2013-09-22 DIAGNOSIS — I5020 Unspecified systolic (congestive) heart failure: Secondary | ICD-10-CM | POA: Diagnosis not present

## 2013-09-22 DIAGNOSIS — M545 Low back pain, unspecified: Secondary | ICD-10-CM | POA: Diagnosis not present

## 2013-09-22 DIAGNOSIS — M47817 Spondylosis without myelopathy or radiculopathy, lumbosacral region: Secondary | ICD-10-CM | POA: Diagnosis not present

## 2013-09-22 LAB — CBC WITH DIFFERENTIAL/PLATELET
BASOS ABS: 0 10*3/uL (ref 0.0–0.1)
Basophils Relative: 0.2 % (ref 0.0–3.0)
EOS ABS: 0.3 10*3/uL (ref 0.0–0.7)
Eosinophils Relative: 3.2 % (ref 0.0–5.0)
HCT: 42.3 % (ref 39.0–52.0)
HEMOGLOBIN: 13.9 g/dL (ref 13.0–17.0)
LYMPHS PCT: 18.3 % (ref 12.0–46.0)
Lymphs Abs: 1.7 10*3/uL (ref 0.7–4.0)
MCHC: 32.8 g/dL (ref 30.0–36.0)
MCV: 86.5 fl (ref 78.0–100.0)
Monocytes Absolute: 0.7 10*3/uL (ref 0.1–1.0)
Monocytes Relative: 7.4 % (ref 3.0–12.0)
NEUTROS ABS: 6.6 10*3/uL (ref 1.4–7.7)
NEUTROS PCT: 70.9 % (ref 43.0–77.0)
Platelets: 163 10*3/uL (ref 150.0–400.0)
RBC: 4.89 Mil/uL (ref 4.22–5.81)
RDW: 14.6 % (ref 11.5–15.5)
WBC: 9.3 10*3/uL (ref 4.0–10.5)

## 2013-09-22 LAB — BASIC METABOLIC PANEL
BUN: 15 mg/dL (ref 6–23)
CALCIUM: 9.1 mg/dL (ref 8.4–10.5)
CHLORIDE: 93 meq/L — AB (ref 96–112)
CO2: 31 meq/L (ref 19–32)
CREATININE: 1 mg/dL (ref 0.4–1.5)
GFR: 78.62 mL/min (ref >60.00)
GLUCOSE: 269 mg/dL — AB (ref 70–99)
Potassium: 3.8 mEq/L (ref 3.5–5.1)
Sodium: 137 mEq/L (ref 135–145)

## 2013-09-22 LAB — PROTIME-INR
INR: 0.9 ratio (ref 0.8–1.0)
Prothrombin Time: 10.2 s (ref 9.6–13.1)

## 2013-09-24 ENCOUNTER — Ambulatory Visit (HOSPITAL_COMMUNITY)
Admission: RE | Admit: 2013-09-24 | Discharge: 2013-09-24 | Disposition: A | Discharge status: Home / Self Care | Payer: Federal, State, Local not specified - PPO | Source: Ambulatory Visit | Attending: Cardiovascular Disease | Admitting: Cardiovascular Disease

## 2013-09-24 ENCOUNTER — Encounter (HOSPITAL_COMMUNITY)
Admission: RE | Disposition: A | Discharge status: Home / Self Care | Payer: Self-pay | Source: Ambulatory Visit | Attending: Cardiovascular Disease

## 2013-09-24 DIAGNOSIS — I200 Unstable angina: Secondary | ICD-10-CM | POA: Diagnosis not present

## 2013-09-24 DIAGNOSIS — E669 Obesity, unspecified: Secondary | ICD-10-CM | POA: Insufficient documentation

## 2013-09-24 DIAGNOSIS — R0989 Other specified symptoms and signs involving the circulatory and respiratory systems: Secondary | ICD-10-CM | POA: Insufficient documentation

## 2013-09-24 DIAGNOSIS — G20 Parkinson's disease: Secondary | ICD-10-CM | POA: Diagnosis not present

## 2013-09-24 DIAGNOSIS — I255 Ischemic cardiomyopathy: Secondary | ICD-10-CM

## 2013-09-24 DIAGNOSIS — J4489 Other specified chronic obstructive pulmonary disease: Secondary | ICD-10-CM | POA: Diagnosis not present

## 2013-09-24 DIAGNOSIS — I2589 Other forms of chronic ischemic heart disease: Secondary | ICD-10-CM | POA: Insufficient documentation

## 2013-09-24 DIAGNOSIS — Z9861 Coronary angioplasty status: Secondary | ICD-10-CM | POA: Diagnosis not present

## 2013-09-24 DIAGNOSIS — I5020 Unspecified systolic (congestive) heart failure: Secondary | ICD-10-CM

## 2013-09-24 DIAGNOSIS — G20A1 Parkinson's disease without dyskinesia, without mention of fluctuations: Secondary | ICD-10-CM | POA: Insufficient documentation

## 2013-09-24 DIAGNOSIS — E785 Hyperlipidemia, unspecified: Secondary | ICD-10-CM | POA: Insufficient documentation

## 2013-09-24 DIAGNOSIS — Z7982 Long term (current) use of aspirin: Secondary | ICD-10-CM | POA: Insufficient documentation

## 2013-09-24 DIAGNOSIS — I2510 Atherosclerotic heart disease of native coronary artery without angina pectoris: Secondary | ICD-10-CM | POA: Diagnosis not present

## 2013-09-24 DIAGNOSIS — I252 Old myocardial infarction: Secondary | ICD-10-CM | POA: Diagnosis not present

## 2013-09-24 DIAGNOSIS — J449 Chronic obstructive pulmonary disease, unspecified: Secondary | ICD-10-CM | POA: Insufficient documentation

## 2013-09-24 DIAGNOSIS — Z01812 Encounter for preprocedural laboratory examination: Secondary | ICD-10-CM

## 2013-09-24 DIAGNOSIS — E119 Type 2 diabetes mellitus without complications: Secondary | ICD-10-CM | POA: Insufficient documentation

## 2013-09-24 DIAGNOSIS — Z6839 Body mass index (BMI) 39.0-39.9, adult: Secondary | ICD-10-CM | POA: Insufficient documentation

## 2013-09-24 DIAGNOSIS — I10 Essential (primary) hypertension: Secondary | ICD-10-CM | POA: Diagnosis not present

## 2013-09-24 DIAGNOSIS — Z951 Presence of aortocoronary bypass graft: Secondary | ICD-10-CM | POA: Diagnosis not present

## 2013-09-24 DIAGNOSIS — G4733 Obstructive sleep apnea (adult) (pediatric): Secondary | ICD-10-CM | POA: Diagnosis not present

## 2013-09-24 DIAGNOSIS — R5381 Other malaise: Secondary | ICD-10-CM | POA: Diagnosis not present

## 2013-09-24 DIAGNOSIS — R0609 Other forms of dyspnea: Secondary | ICD-10-CM | POA: Diagnosis not present

## 2013-09-24 HISTORY — PX: LEFT AND RIGHT HEART CATHETERIZATION WITH CORONARY ANGIOGRAM: SHX5449

## 2013-09-24 LAB — GLUCOSE, CAPILLARY
Glucose-Capillary: 270 mg/dL — ABNORMAL HIGH (ref 70–99)
Glucose-Capillary: 223 mg/dL — ABNORMAL HIGH (ref 70–99)

## 2013-09-24 SURGERY — LEFT AND RIGHT HEART CATHETERIZATION WITH CORONARY ANGIOGRAM
Anesthesia: LOCAL

## 2013-09-24 MED ORDER — SODIUM CHLORIDE 0.9 % IV SOLN: 250.0000 mL | INTRAVENOUS | Status: DC | PRN

## 2013-09-24 MED ORDER — SODIUM CHLORIDE 0.9 % IV SOLN
INTRAVENOUS | Status: AC
  Administered 2013-09-24: 13:00:00 via INTRAVENOUS

## 2013-09-24 MED ORDER — SODIUM CHLORIDE 0.9 % IJ SOLN: 3.0000 mL | Freq: Two times a day (BID) | INTRAMUSCULAR | Status: DC

## 2013-09-24 MED ORDER — SODIUM CHLORIDE 0.9 % IJ SOLN: 3.0000 mL | INTRAMUSCULAR | Status: DC | PRN

## 2013-09-24 MED ORDER — MIDAZOLAM HCL 2 MG/2ML IJ SOLN
INTRAMUSCULAR | Status: AC
Start: 2013-09-24 — End: 2013-09-24
  Filled 2013-09-24: qty 2

## 2013-09-24 MED ORDER — FENTANYL CITRATE 0.05 MG/ML IJ SOLN
INTRAMUSCULAR | Status: AC
  Filled 2013-09-24: qty 2

## 2013-09-24 MED ORDER — ASPIRIN 81 MG PO CHEW: 81.0000 mg | CHEWABLE_TABLET | ORAL | Status: DC

## 2013-09-24 MED ORDER — LIDOCAINE HCL (PF) 1 % IJ SOLN
INTRAMUSCULAR | Status: AC
  Filled 2013-09-24: qty 30

## 2013-09-24 NOTE — Progress Notes (Signed)
Site area: right groin 90fr arterial and 7 fr venous sheath  Site Prior to Removal:  Level 0  Pressure Applied For 20 MINUTES    Minutes Beginning at 1220p  Manual:   Yes.    Patient Status During Pull:  stable  Post Pull Groin Site:  Level 0  Post Pull Instructions Given:  Yes.    Post Pull Pulses Present:  Yes.    Dressing Applied:  Yes.    Comments:  VS remain stable during sheath pull.  Pt denies any discomfort at site at this time.

## 2013-09-24 NOTE — Discharge Instructions (Signed)
Hold Janumet for 48 hours.     Arteriogram Care After These instructions give you information on caring for yourself after your procedure. Your doctor may also give you more specific instructions. Call your doctor if you have any problems or questions after your procedure. HOME CARE  Keep your leg straight for at least 6 hours.  Do not bathe, swim, or use a hot tub until directed by your doctor. You can shower.  Do not lift anything heavier than 10 pounds (about a gallon of milk) for 2 days.  Do not walk a lot, run, or drive for 2 days.  Return to normal activities in 2 days or as told by your doctor. Finding out the results of your test Ask when your test results will be ready. Make sure you get your test results. GET HELP RIGHT AWAY IF:   You have fever.  You have more pain in your leg.  The leg that was cut is:  Bleeding.  Puffy (swollen) or red.  Cold.  Pale or changes color.  Weak.  Tingly or numb. If you go to the Emergency Room, tell your nurse that you have had an arteriogram. Take this paper with you to show the nurse. MAKE SURE YOU:  Understand these instructions.  Will watch your condition.  Will get help right away if you are not doing well or get worse. Document Released: 03/24/2008 Document Revised: 12/31/2012 Document Reviewed: 03/24/2008 [facility] Patient Information 2015 [place], [place]. This information is not intended to replace advice given to you by your health care provider. Make sure you discuss any questions you have with your health care provider.

## 2013-09-24 NOTE — CV Procedure (Addendum)
      Cardiac Catheterization Operative Report  FRANKIE SCIPIO [id number] 9/16/201511:22 AM [name], MD  Procedure Performed:  1. Left Heart Catheterization 2. Selective Coronary Angiography 3. Right Heart Catheterization 4. SVG angiography 5. LIMA graft angiography  Operator: Lauree Chandler, MD  Indication:  63 yo male with history of CAD s/p 3V CABG in 2000, ischemic cardiomyopathy with recent c/o dyspnea and chest pain c/w unstable angina. He is being evaluated for an ICD and cardiac cath today is to exclude progression of CAD given his change in symptoms.                                 Procedure Details: The risks, benefits, complications, treatment options, and expected outcomes were discussed with the patient. The patient and/or family concurred with the proposed plan, giving informed consent. The patient was brought to the cath lab after IV hydration was begun and oral premedication was given. The patient was further sedated with Versed and Fentanyl. The right groin was prepped and draped in the usual manner. Using the modified Seldinger access technique, a 5 French sheath was placed in the right femoral artery. A 6 French sheath was inserted into the right femoral vein. A balloon tipped catheter was used to perform a right heart catheterization. Standard diagnostic catheters were used to perform selective coronary angiography. The JR4 catheter was used to engage the vein graft to the RCA and the LIMA graft. A LCB catheter was used to engage the vein graft to the Diagonal. A pigtail catheter was used to measure LV pressures. There were no immediate complications. The patient was taken to the recovery area in stable condition.   Hemodynamic Findings: Ao:  119/76            LV: 118/12/19 RA:  13           RV: 38/14/17 PA: 32/20 (mean 25)        PCWP:  16 Fick Cardiac Output:  6 L/min Fick Cardiac Index: 2.6 L/min/m2 Central Aortic Saturation: 90% Pulmonary  Artery Saturation: 63%  Angiographic Findings:  Left main: No obstructive disease.   Left Anterior Descending Artery: Large caliber vessel that courses to the apex. 100% mid occlusion. The mid and distal fills from the patent bypass graft. The diagonal fills from the patent vein graft.   Circumflex Artery: Large caliber vessel with aneurysmal segment mid vessel. Large obtuse marginal branch. No obstructive disease.   Right Coronary Artery: Large dominant vessel with 30% mid stenosis, diffuse severe distal stenosis. Competitive filling is seen beginning in the mid vessel from the patent vein graft.   Graft Anatomy:  SVG to PDA is patent SVG to diagonal is patent LIMA graft to mid LAD is patent.   Left Ventricular Angiogram: Deferred.   Impression: 1. Severe double vessel CAD s/p 3V CABG with patent grafts to the LAD, Diagonal and RCA.  2. Mildly elevated filling pressures 3. Dyspnea likely multi-factorial due to obesity, deconditioning, OSA, cardiomyopathy  Recommendations: Continue medical management. Cath follow up in the office with Dr. Stanford Breed or one of the office NPs/PAs. Further planning for ICD per Dr. Lovena Le.        Complications:  None; patient tolerated the procedure well.

## 2013-09-24 NOTE — Interval H&P Note (Signed)
History and Physical Interval Note:  09/24/2013 11:05 AM  Henry [name]  has presented today for cardiac cath with the diagnosis of chf, ischemic cardiomyopathy, dyspnea c/w prior angina  The various methods of treatment have been discussed with the patient and family. After consideration of risks, benefits and other options for treatment, the patient has consented to  Procedure(s): LEFT AND RIGHT HEART CATHETERIZATION WITH CORONARY ANGIOGRAM (N/A) as a surgical intervention .  The patient's history has been reviewed, patient examined, no change in status, stable for surgery.  I have reviewed the patient's chart and labs.  Questions were answered to the patient's satisfaction.    Cath Lab Visit (complete for each Cath Lab visit)  Clinical Evaluation Leading to the Procedure:   ACS: No.  Non-ACS:    Anginal Classification: CCS III  Anti-ischemic medical therapy: Minimal Therapy (1 class of medications)  Non-Invasive Test Results: No non-invasive testing performed  Prior CABG: Previous CABG        MCALHANY,CHRISTOPHER

## 2013-09-24 NOTE — H&P (View-Only) (Signed)
HPI Henry [name] is referred today by Dr. Stanford Breed to consider ICD implant. 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 He states that he has not had a repeat cath since his bypass. He has had increasing sob, chest pressure and weakness. He has sleep apnea but does not wear his cpap. He has dyspnea with any exertion. He noted that when he had his bypass, he did not have chest pressure but sob. By echo his EF has varied from 25% to 35%.  Allergies  Allergen Reactions  . Penicillins Rash    Because of a history of documented adverse serious drug reaction;Medi Alert bracelet  is recommended     Current Outpatient Prescriptions  Medication Sig Dispense Refill  . ADVAIR DISKUS 250-50 MCG/DOSE AEPB Inhale 1 puff into the lungs daily.       Marland Kitchen aspirin EC 81 MG tablet Take 81 mg by mouth daily.      . carbidopa-levodopa (SINEMET IR) 25-100 MG per tablet Take 1 tablet by mouth 3 (three) times daily.  90 tablet  5  . clonazePAM (KLONOPIN) 0.5 MG tablet Take 0.5 mg by mouth at bedtime.       . furosemide (LASIX) 80 MG tablet Take 120 mg by mouth daily.      Marland Kitchen glimepiride (AMARYL) 2 MG tablet TAKE 1 TABLET BY MOUTH IN THE MORNING AND 1/2 TABLET AT NIGHT  135 tablet  1  . glucose blood (ONE TOUCH ULTRA TEST) test strip Check blood sugar once a day  100 each  12  . ibuprofen (ADVIL,MOTRIN) 200 MG tablet Take 800 mg by mouth daily as needed for pain.      Marland Kitchen levothyroxine (SYNTHROID, LEVOTHROID) 125 MCG tablet Take 1 tablet (125 mcg total) by mouth daily before breakfast.  90 tablet  1  . losartan (COZAAR) 50 MG tablet Take 50 mg by mouth at bedtime.      . metoprolol succinate (TOPROL-XL) 50 MG 24 hr tablet Take 1 tablet (50 mg total) by mouth at bedtime. Take with or immediately following a meal.  30 tablet  06  . Multiple Vitamin (MULTIVITAMIN WITH MINERALS) TABS tablet Take 1 tablet by mouth daily.  Antioxidant  Multi vitamin      . nitroGLYCERIN (NITROSTAT) 0.4 MG SL tablet Place 1 tablet (0.4 mg total) under the tongue every 5 (five) minutes as needed for chest pain.  25 tablet  2  . ONETOUCH DELICA LANCETS FINE MISC Use as directed  100 each  11  . potassium chloride (K-DUR) 10 MEQ tablet Take 30 mEq by mouth at bedtime.      . pramipexole (MIRAPEX) 0.5 MG tablet Take 1 tablet (0.5 mg total) by mouth 3 (three) times daily.  90 tablet  5  . rosuvastatin (CRESTOR) 10 MG tablet Take 20 mg by mouth daily.      . sitaGLIPtin-metformin (JANUMET) 50-1000 MG per tablet Take 1 tablet by mouth 2 (two) times daily with a meal.      . spironolactone (ALDACTONE) 25 MG tablet Take 1 tablet (25 mg total) by mouth daily.  90 tablet  3  . traMADol (ULTRAM) 50 MG tablet TAKE 1 TABLET BY MOUTH TWICE A DAY AS NEEDED FOR PAIN      . travoprost, benzalkonium, (TRAVATAN) 0.004 % ophthalmic solution Place 1 drop into the right eye at bedtime.       Marland Kitchen  trihexyphenidyl (ARTANE) 2 MG tablet Take 1 tablet (2 mg total) by mouth 3 (three) times daily with meals.  270 tablet  1  . VENTOLIN HFA 108 (90 BASE) MCG/ACT inhaler Inhale 1 puff into the lungs daily.       . vitamin B-12 (CYANOCOBALAMIN) 1000 MCG tablet Take 1,000 mcg by mouth daily.       No current facility-administered medications for this visit.     Past Medical History  Diagnosis Date  . Cardiomyopathy   . CAD (coronary artery disease)   . HTN (hypertension)     pt denies 08/19/12  . MI (myocardial infarction)   . Sleep apnea   . GERD (gastroesophageal reflux disease)   . Hyperthyroidism   . Hyperplasia, prostate   . Benign neoplasm of colon   . Nephrolithiasis   . Ventral hernia   . HLD (hyperlipidemia)   . Parkinson disease     1999  . OSA (obstructive sleep apnea)     AHI-28,on CPAP, noncompliant with CPAP  . Sleep apnea, organic   . Complication of anesthesia     pt states that he got a rash  . Arthritis   . Shortness of breath      Hx: of at all times  . UTI (lower urinary tract infection) 09/15/12    Klebsiella  . Asthma   . DM (diabetes mellitus)     TYPE 2     ROS:   All systems reviewed and negative except as noted in the HPI.   Past Surgical History  Procedure Laterality Date  . Coronary artery bypass graft  2000    [name], MD  . Coronary stent placement  1998  . Median sternotomy  2000  . Deep brain stimulator placement  2004    Right and left VIN stimulator placement   . Acoustic neuroma resection  1981  . Finger amputation      left pointer  . Cataract extraction w/ intraocular lens implant      Hx: of right eye  . Tonsillectomy    . Colonoscopy w/ biopsies and polypectomy      Hx: of  . Cardiac catheterization    . Subthalamic stimulator battery replacement N/A 09/05/2012    Procedure: Deep brain stimulator battery change;  Surgeon: Erline Levine, MD;  Location: [facility];  Service: Neurosurgery;  Laterality: N/A;  Deep brain stimulator battery change     Family History  Problem Relation Age of Onset  . Peripheral vascular disease    . Arthritis    . Diabetes    . Alcoholism Mother 43    deceased   . Aneurysm Father 43  . Autoimmune disease Brother 62    AIDS     History   Social History  . Marital Status: Married    Spouse Name: [name]    Number of Children: 2  . Years of Education: N/A   Occupational History  . 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 Smoking status: Never Smoker   . Smokeless tobacco: Never Used  . Alcohol Use: Yes     Comment: occasional wine  . Drug Use: No  . Sexual Activity: Not on file   Other Topics Concern  . Not on file   Social History Narrative  . No narrative on file     BP 112/68  Pulse 81  Ht 5' 8.5" (1.74 m)  Wt 265 lb 12.8  oz (120.566 kg)  BMI 39.82 kg/m2  SpO2 88%  Physical Exam:  Well appearing NAD HEENT: Unremarkable Neck:  No JVD, no thyromegally Lymphatics:  No  adenopathy Back:  No CVA tenderness Lungs:  Clear HEART:  Regular rate rhythm, no murmurs, no rubs, no clicks Abd:  soft, positive bowel sounds, no organomegally, no rebound, no guarding Ext:  2 plus pulses, no edema, no cyanosis, no clubbing Skin:  No rashes no nodules Neuro:  CN II through XII intact, motor grossly intact   Assess/Plan:

## 2013-09-25 ENCOUNTER — Other Ambulatory Visit: Payer: Self-pay | Admitting: Internal Medicine

## 2013-09-25 LAB — POCT I-STAT 3, VENOUS BLOOD GAS (G3P V)
ACID-BASE EXCESS: 6 mmol/L — AB (ref 0.0–2.0)
BICARBONATE: 32.8 meq/L — AB (ref 20.0–24.0)
O2 SAT: 63 %
PO2 VEN: 33 mmHg (ref 30.0–45.0)
TCO2: 34 mmol/L (ref 0–100)
pCO2, Ven: 53 mmHg — ABNORMAL HIGH (ref 45.0–50.0)
pH, Ven: 7.4 — ABNORMAL HIGH (ref 7.250–7.300)

## 2013-09-25 LAB — POCT I-STAT 3, ART BLOOD GAS (G3+)
Acid-Base Excess: 5 mmol/L — ABNORMAL HIGH (ref 0.0–2.0)
BICARBONATE: 30.8 meq/L — AB (ref 20.0–24.0)
O2 Saturation: 90 %
PCO2 ART: 49.6 mmHg — AB (ref 35.0–45.0)
PH ART: 7.401 (ref 7.350–7.450)
PO2 ART: 60 mmHg — AB (ref 80.0–100.0)
TCO2: 32 mmol/L (ref 0–100)

## 2013-09-26 NOTE — Telephone Encounter (Signed)
08/18/13 last office visit

## 2013-09-26 NOTE — Telephone Encounter (Signed)
OK X1 

## 2013-10-06 ENCOUNTER — Other Ambulatory Visit: Payer: Self-pay | Admitting: Internal Medicine

## 2013-10-11 ENCOUNTER — Other Ambulatory Visit: Payer: Self-pay | Admitting: Internal Medicine

## 2013-10-13 NOTE — Telephone Encounter (Signed)
Tram[facility]ol has been called to [facility]  

## 2013-10-13 NOTE — Telephone Encounter (Signed)
OK X1 

## 2013-10-14 ENCOUNTER — Encounter: Payer: Self-pay | Admitting: Internal Medicine

## 2013-10-14 ENCOUNTER — Ambulatory Visit (INDEPENDENT_AMBULATORY_CARE_PROVIDER_SITE_OTHER): Payer: Federal, State, Local not specified - PPO | Admitting: Internal Medicine

## 2013-10-14 VITALS — BP 110/70 | HR 74 | Ht 69.0 in | Wt 267.0 lb

## 2013-10-14 DIAGNOSIS — I5043 Acute on chronic combined systolic (congestive) and diastolic (congestive) heart failure: Secondary | ICD-10-CM | POA: Diagnosis not present

## 2013-10-14 DIAGNOSIS — I2510 Atherosclerotic heart disease of native coronary artery without angina pectoris: Secondary | ICD-10-CM

## 2013-10-14 NOTE — Assessment & Plan Note (Signed)
His heart failure symptoms are currently class III. He will continue his current medical therapy. We discussed the treatment options as they pertain to insertion of an ICD. The risk, goals, and of its, and expectations of ICD implantation have been discussed with the patient, and he will call us if you would like to proceed.

## 2013-10-14 NOTE — Progress Notes (Signed)
HPI Henry [name] returns today for followup. He is a very pleasant middle aged man with an ICM, chronic class 3 CHF, EF 25-30% by echo, and Parkinson's disease s/p deep brain stimulator. He has been stable since his heart cath. He has sleep apnea but cannot tolerate CPAP. He tells me he has tried multiple configurations. He denies anginal symptoms. He has never had syncope. His left ventricular dysfunction has been long-standing, for several years.  Allergies  Allergen Reactions  . Penicillins Rash    Because of a history of documented adverse serious drug reaction;Medi Alert bracelet  is recommended     Current Outpatient Prescriptions  Medication Sig Dispense Refill  . ADVAIR DISKUS 250-50 MCG/DOSE AEPB Inhale 1 puff into the lungs daily.       Marland Kitchen aspirin EC 81 MG tablet Take 81 mg by mouth daily.      Marland Kitchen atorvastatin (LIPITOR) 40 MG tablet Take 1 tablet (40 mg total) by mouth daily.  90 tablet  3  . carbidopa-levodopa (SINEMET IR) 25-100 MG per tablet Take 1 tablet by mouth 3 (three) times daily.  90 tablet  5  . clonazePAM (KLONOPIN) 0.5 MG tablet Take 0.5 mg by mouth at bedtime.       . furosemide (LASIX) 80 MG tablet Take 120 mg by mouth daily.      Marland Kitchen glimepiride (AMARYL) 2 MG tablet Take 1-2 mg by mouth 2 (two) times daily. 2mg  in the morning and 1mg  in the evening      . glucose blood (ONE TOUCH ULTRA TEST) test strip Check blood sugar once a day  100 each  12  . ibuprofen (ADVIL,MOTRIN) 200 MG tablet Take 800 mg by mouth daily as needed for pain.      Marland Kitchen JANUMET 50-1000 MG per tablet Take 1 tablet by mouth 2 (two) times daily.      Marland Kitchen KLOR-CON M10 10 MEQ tablet TAKE 3 TABLETS BY MOUTH DAILY  90 tablet  1  . levothyroxine (SYNTHROID, LEVOTHROID) 125 MCG tablet Take 1 tablet (125 mcg total) by mouth daily before breakfast.  90 tablet  1  . losartan (COZAAR) 50 MG tablet Take 50 mg by mouth at bedtime.      . metoprolol succinate (TOPROL-XL) 50 MG 24 hr tablet Take 1 tablet (50 mg  total) by mouth at bedtime. Take with or immediately following a meal.  30 tablet  06  . Multiple Vitamin (MULTIVITAMIN WITH MINERALS) TABS tablet Take 1 tablet by mouth daily. Antioxidant  Multi vitamin      . nitroGLYCERIN (NITROSTAT) 0.4 MG SL tablet Place 1 tablet (0.4 mg total) under the tongue every 5 (five) minutes as needed for chest pain.  25 tablet  2  . ONETOUCH DELICA LANCETS FINE MISC Use as directed  100 each  11  . pramipexole (MIRAPEX) 0.5 MG tablet Take 0.5 mg by mouth 2 (two) times daily.      Marland Kitchen spironolactone (ALDACTONE) 25 MG tablet Take 1 tablet (25 mg total) by mouth daily.  90 tablet  3  . traMADol (ULTRAM) 50 MG tablet TAKE 1 TABLET BY MOUTH TWICE A DAY AS NEEDED  30 tablet  0  . travoprost, benzalkonium, (TRAVATAN) 0.004 % ophthalmic solution Place 1 drop into the right eye at bedtime.       . trihexyphenidyl (ARTANE) 2 MG tablet Take 2 mg by mouth 2 (two) times daily with a meal.      . VENTOLIN  HFA 108 (90 BASE) MCG/ACT inhaler Inhale 1 puff into the lungs daily.       . vitamin B-12 (CYANOCOBALAMIN) 1000 MCG tablet Take 1,000 mcg by mouth daily.       No current facility-administered medications for this visit.     Past Medical History  Diagnosis Date  . Cardiomyopathy   . CAD (coronary artery disease)   . HTN (hypertension)     pt denies 08/19/12  . MI (myocardial infarction)   . Sleep apnea   . GERD (gastroesophageal reflux disease)   . Hyperthyroidism   . Hyperplasia, prostate   . Benign neoplasm of colon   . Nephrolithiasis   . Ventral hernia   . HLD (hyperlipidemia)   . Parkinson disease     1999  . OSA (obstructive sleep apnea)     AHI-28,on CPAP, noncompliant with CPAP  . Sleep apnea, organic   . Complication of anesthesia     pt states that he got a rash  . Arthritis   . Shortness of breath     Hx: of at all times  . UTI (lower urinary tract infection) 09/15/12    Klebsiella  . Asthma   . DM (diabetes mellitus)     TYPE 2     ROS:    All systems reviewed and negative except as noted in the HPI.   Past Surgical History  Procedure Laterality Date  . Coronary artery bypass graft  2000    [name], MD  . Coronary stent placement  1998  . Median sternotomy  2000  . Deep brain stimulator placement  2004    Right and left VIN stimulator placement   . Acoustic neuroma resection  1981  . Finger amputation      left pointer  . Cataract extraction w/ intraocular lens implant      Hx: of right eye  . Tonsillectomy    . Colonoscopy w/ biopsies and polypectomy      Hx: of  . Cardiac catheterization    . Subthalamic stimulator battery replacement N/A 09/05/2012    Procedure: Deep brain stimulator battery change;  Surgeon: Erline Levine, MD;  Location: [facility];  Service: Neurosurgery;  Laterality: N/A;  Deep brain stimulator battery change     Family History  Problem Relation Age of Onset  . Peripheral vascular disease    . Arthritis    . Diabetes    . Alcoholism Mother 42    deceased   . Aneurysm Father 21  . Autoimmune disease Brother 56    AIDS     History   Social History  . Marital Status: Married    Spouse Name: [name]    Number of Children: 2  . Years of Education: N/A   Occupational History  . 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 Smoking status: Never Smoker   . Smokeless tobacco: Never Used  . Alcohol Use: Yes     Comment: occasional wine  . Drug Use: No  . Sexual Activity: Not on file   Other Topics Concern  . Not on file   Social History Narrative  . No narrative on file     BP 110/70  Pulse 74  Ht 5\' 9"  (1.753 m)  Wt 267 lb (121.11 kg)  BMI 39.41 kg/m2  Physical Exam:  Stable appearing middle-aged man, obese, NAD HEENT: Unremarkable Neck:  No JVD, no thyromegally Back:  No CVA  tenderness Lungs:  Clear with no wheezes, rales, or rhonchi. HEART:  Regular rate rhythm, no murmurs, no rubs, no clicks Abd:  soft, obese, positive bowel  sounds, no organomegally, no rebound, no guarding Ext:  2 plus pulses, no edema, no cyanosis, no clubbing Skin:  No rashes no nodules Neuro:  CN II through XII intact, motor grossly intact   Assess/Plan:

## 2013-10-14 NOTE — Patient Instructions (Signed)
Your physician recommends that you continue on your current medications as directed. Please refer to the Current Medication list given to you today. Following are possible dates available for ICD insertion.  Please call Dr. Tanna Furry nurse when you decide on a date.  November 3,5,6 November 9,10,11 November 16,18 November 23

## 2013-10-17 ENCOUNTER — Telehealth: Payer: Self-pay | Admitting: Internal Medicine

## 2013-10-17 NOTE — Telephone Encounter (Signed)
New Prob    Pt calling to schedule procedure at hospital. Please call.

## 2013-10-17 NOTE — Telephone Encounter (Signed)
ICD 11/5, labs 11/2

## 2013-10-23 ENCOUNTER — Ambulatory Visit (INDEPENDENT_AMBULATORY_CARE_PROVIDER_SITE_OTHER): Payer: Federal, State, Local not specified - PPO | Admitting: Neurology

## 2013-10-23 ENCOUNTER — Encounter: Payer: Self-pay | Admitting: Neurology

## 2013-10-23 VITALS — BP 132/78 | HR 80 | Ht 68.0 in | Wt 269.0 lb

## 2013-10-23 DIAGNOSIS — G4752 REM sleep behavior disorder: Secondary | ICD-10-CM

## 2013-10-23 DIAGNOSIS — K5901 Slow transit constipation: Secondary | ICD-10-CM | POA: Diagnosis not present

## 2013-10-23 DIAGNOSIS — G4733 Obstructive sleep apnea (adult) (pediatric): Secondary | ICD-10-CM | POA: Diagnosis not present

## 2013-10-23 DIAGNOSIS — I2510 Atherosclerotic heart disease of native coronary artery without angina pectoris: Secondary | ICD-10-CM

## 2013-10-23 DIAGNOSIS — G20 Parkinson's disease: Secondary | ICD-10-CM

## 2013-10-23 NOTE — Progress Notes (Signed)
Henry [name] was seen today in the movement disorders clinic for neurologic consultation at the request of [name].  His PCP is [name], MD.  The consultation is for the evaluation of PD and to manage his DBS.  He is accompanied by his wife who supplements the history.  The first symptom(s) the patient noticed was right hand tremor in 1999.  He was seen by neurology and was dx with PD.  He was placed on something, but it caused sleepiness.  He does not think that he has ever been on levodopa.   He was placed on Mirapex, which seemed to help.  He only takes it twice per day.  He didn't think it made a difference when he took it tid.  He began to have tremor and it was suggested he do DBS.    The pt is s/p stn DBS in 2005.  He had a battery change in 2009.    10/02/12 update: The patient is accompanied today by his daughter, who supplements the history.  I reviewed medical records available to me since last visit.  The patient had his generator changed on 09/05/2012.  He remains on pramipexole, 0.5 mg twice per day.  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 [name] in that regard.  He is not compliant with CPAP so says that he doesn't want to return to her for f/u. He doesn't take the klonopin faithfully.  10/11/12 update:  Pt was seen as a walk in/work in today.  Pt had increasing tremor, L greater than R for 3 days.  Thinks that it started with the d/c of artane.  Speech stable.  11/19/12 update:  Pt is seen today for his PD, accompanied by his daughter who supplements the hx.    He is currently on klonopin 0.5 mg - 1/2-1 tablet q hs.  He only takes it when his wife is not working.  She works nights and is only in 2 days per week.  He has some reluctance to take it other nights.  He is on pramipexole 0.5 mg bid.  Last visit, his DBS was reset more similar to the settings he had prior to coming here, just with an increased voltage.  About 2 weeks after our last  visit, the patient decided to go back on the Artane.  The combination of the Artane and the DBS changes helped significantly.  He does ask if I can slightly increased voltage on the left hand, as he has some tremor at night that is bothersome.  Otherwise, he is doing well.  He feels that his balance has been great.  02/19/13 update:  This patient is accompanied in the office by his child who supplements the history.  The pt has a hx of PD.  He has been tremor free and is very happy about that.  He has had some increased balance loss; the last fall was a few months ago but he hasn't gotten hurt.  Was considering hernia surgery.  The records that were made available to me were reviewed.  He is holding on that for now but plans to have it done before end of year.  No hallucinations.  He is still having some acting out of the dreams, despite clonazepam.  06/19/13 update:  Patient is returning to followup regarding his Parkinson's disease.  I had the opportunity to review records since last visit.  He went to the emergency room on 05/12/2013  with chest pain and shortness of breath.  He had been fishing and had missed several doses of Lasix.  He had been eating seafood.  He ended up with an acute exacerbation of his chronic congestive heart failure.  Once he was diuresed and back on his medications, he is feeling better.  He just had a nuclear medicine study done on 06/17/2013 and the ejection fraction on this looked better than on his echocardiogram.  The left ventricular ejection fraction was 41%.  Prior to that, there was some concern that his left ventricular ejection fraction had dropped so much that he may need an ICD.  In terms of Parkinson's disease, the patient states that he has been doing very well in terms of tremor, but his walking really has deteriorated.  He describes a festinating gait, with much more shuffling.  He fell walking over his dog gate but otherwise has not had falls.  He went fishing  yesterday and states that he "stumbled all day long."  No hallucinations.  He has been trying to be more faithful with his CPAP. He remains on Mirapex 0.5 mg twice a day, Artane 2 mg twice a day.  10/23/13 update:  The patient returns today to the clinic, accompanied by his wife who supplements the history.  From a Parkinson's standpoint, the patient states that he has been doing very well.  No tremor.  He started on levodopa last visit and states that he has had no falls and overall stumbling has been much better.  He states that the only time that he stumbles is if he is inside of his fishing boat, and states that that is really because it is small and unstable.  He is taking his levodopa in the morning, after lunch and bedtime.  He has had no hallucinations.  His wife states that he is still draining and acting out the dreams and even fell out of bed a few days ago.  However, he is taking his clonazepam about 10 PM but doesn't go to bed until 1 AM.  He is not using his CPAP.  Is having significant constipation.   I reviewed his cardiology records since last visit.  He did have a heart catheterization and is scheduled to have an ICD placed on November 5.   Neuroimaging has  previously been performed.  It is available for my review today.  This was done in February, 2005 in preparation for DBS.  The examination was essentially unremarkable.  The formal report is below:  Clinical Data: Parkinson's disease.  MRI BRAIN WITHOUT AND WITH CONTRAST  Technique: Stealth protocol.  Multiplanar T1- weighted images were obtained before and after the administration of 20 cc of Omniscan. 5.0, 3.0, and ultimately 1.3 mm thick images were obtained through the brain with special attention to the basal ganglia and deep commissures to allow for stereotactic planning and treatment of Parkinson's disease. There are no discrete or focal lesions demonstrated on these limited pulse sequences, which do not include a diffusion or  T2- weighted pulse sequence. FLAIR images were obtained and show no significant small vessel disease or ventriculomegaly.  IMPRESSION  MRI of the brain without and with contrast performed according to Stealth protocol showing no obvious focal or enhancing lesion.   PREVIOUS MEDICATIONS: Mirapex and artane  ALLERGIES:   Allergies  Allergen Reactions  . Penicillins Rash    Because of a history of documented adverse serious drug reaction;Medi Alert bracelet  is recommended    CURRENT  MEDICATIONS:     Medication List       This list is accurate as of: 10/23/13  1:54 PM.  Always use your most recent med list.               ADVAIR DISKUS 250-50 MCG/DOSE Aepb  Generic drug:  Fluticasone-Salmeterol  Inhale 1 puff into the lungs daily.     aspirin EC 81 MG tablet  Take 81 mg by mouth daily.     atorvastatin 40 MG tablet  Commonly known as:  LIPITOR  Take 1 tablet (40 mg total) by mouth daily.     carbidopa-levodopa 25-100 MG per tablet  Commonly known as:  SINEMET IR  Take 1 tablet by mouth 3 (three) times daily.     clonazePAM 0.5 MG tablet  Commonly known as:  KLONOPIN  Take 0.5 mg by mouth at bedtime.     furosemide 80 MG tablet  Commonly known as:  LASIX  Take 120 mg by mouth daily.     glimepiride 2 MG tablet  Commonly known as:  AMARYL  Take 1-2 mg by mouth 2 (two) times daily. 2mg  in the morning and 1mg  in the evening     glucose blood test strip  Commonly known as:  ONE TOUCH ULTRA TEST  Check blood sugar once a day     ibuprofen 200 MG tablet  Commonly known as:  ADVIL,MOTRIN  Take 800 mg by mouth daily as needed for pain.     JANUMET 50-1000 MG per tablet  Generic drug:  sitaGLIPtin-metformin  Take 1 tablet by mouth 2 (two) times daily.     KLOR-CON M10 10 MEQ tablet  Generic drug:  potassium chloride  TAKE 3 TABLETS BY MOUTH DAILY     levothyroxine 125 MCG tablet  Commonly known as:  SYNTHROID, LEVOTHROID  Take 1 tablet (125 mcg total) by mouth  daily before breakfast.     losartan 50 MG tablet  Commonly known as:  COZAAR  Take 50 mg by mouth at bedtime.     metoprolol succinate 50 MG 24 hr tablet  Commonly known as:  TOPROL-XL  Take 1 tablet (50 mg total) by mouth at bedtime. Take with or immediately following a meal.     multivitamin with minerals Tabs tablet  Take 1 tablet by mouth daily. Antioxidant  Multi vitamin     nitroGLYCERIN 0.4 MG SL tablet  Commonly known as:  NITROSTAT  Place 1 tablet (0.4 mg total) under the tongue every 5 (five) minutes as needed for chest pain.     ONETOUCH DELICA LANCETS FINE Misc  Use as directed     pramipexole 0.5 MG tablet  Commonly known as:  MIRAPEX  Take 0.5 mg by mouth 2 (two) times daily.     spironolactone 25 MG tablet  Commonly known as:  ALDACTONE  Take 1 tablet (25 mg total) by mouth daily.     traMADol 50 MG tablet  Commonly known as:  ULTRAM  TAKE 1 TABLET BY MOUTH TWICE A DAY AS NEEDED     travoprost (benzalkonium) 0.004 % ophthalmic solution  Commonly known as:  TRAVATAN  Place 1 drop into the right eye at bedtime.     trihexyphenidyl 2 MG tablet  Commonly known as:  ARTANE  Take 2 mg by mouth 2 (two) times daily with a meal.     VENTOLIN HFA 108 (90 BASE) MCG/ACT inhaler  Generic drug:  albuterol  Inhale 1 puff into the  lungs daily.     vitamin B-12 1000 MCG tablet  Commonly known as:  CYANOCOBALAMIN  Take 1,000 mcg by mouth daily.         PAST MEDICAL HISTORY:   Past Medical History  Diagnosis Date  . Cardiomyopathy   . CAD (coronary artery disease)   . HTN (hypertension)     pt denies 08/19/12  . MI (myocardial infarction)   . Sleep apnea   . GERD (gastroesophageal reflux disease)   . Hyperthyroidism   . Hyperplasia, prostate   . Benign neoplasm of colon   . Nephrolithiasis   . Ventral hernia   . HLD (hyperlipidemia)   . Parkinson disease     1999  . OSA (obstructive sleep apnea)     AHI-28,on CPAP, noncompliant with CPAP  . Sleep  apnea, organic   . Complication of anesthesia     pt states that he got a rash  . Arthritis   . Shortness of breath     Hx: of at all times  . UTI (lower urinary tract infection) 09/15/12    Klebsiella  . Asthma   . DM (diabetes mellitus)     TYPE 2     PAST SURGICAL HISTORY:   Past Surgical History  Procedure Laterality Date  . Coronary artery bypass graft  2000    [name], MD  . Coronary stent placement  1998  . Median sternotomy  2000  . Deep brain stimulator placement  2004    Right and left VIN stimulator placement   . Acoustic neuroma resection  1981  . Finger amputation      left pointer  . Cataract extraction w/ intraocular lens implant      Hx: of right eye  . Tonsillectomy    . Colonoscopy w/ biopsies and polypectomy      Hx: of  . Cardiac catheterization    . Subthalamic stimulator battery replacement N/A 09/05/2012    Procedure: Deep brain stimulator battery change;  Surgeon: Erline Levine, MD;  Location: [facility];  Service: Neurosurgery;  Laterality: N/A;  Deep brain stimulator battery change    SOCIAL HISTORY:   History   Social History  . Marital Status: Married    Spouse Name: [name]    Number of Children: 2  . Years of Education: N/A   Occupational History  . 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 Smoking status: Never Smoker   . Smokeless tobacco: Never Used  . Alcohol Use: Yes     Comment: occasional wine  . Drug Use: No  . Sexual Activity: Not on file   Other Topics Concern  . Not on file   Social History Narrative  . No narrative on file    FAMILY HISTORY:   Family Status  Relation Status Death Age  . Mother Deceased     complications of surgery  . Father Deceased     EtOHism  . Sister Alive     2 full, one half; PVD  . Brother Deceased     AIDS  . Child Alive     3, alive and well    ROS:  A complete 10 system review of systems was obtained and was unremarkable apart from what is  mentioned above.  PHYSICAL EXAMINATION:    VITALS:   Filed Vitals:   10/23/13 1348  BP: 132/78  Pulse: 80  Height: 5\' 8"  (1.727 m)  Weight:  269 lb (122.018 kg)    GEN:  The patient appears stated age and is in NAD. HEENT:  Normocephalic, atraumatic.  The mucous membranes are moist. The superficial temporal arteries are without ropiness or tenderness. CV:  RRR Lungs:  CTAB Neck/HEME:  There are no carotid bruits bilaterally.  Neurological examination:  Orientation: The patient is alert and oriented x3. Fund of knowledge is appropriate.  Recent and remote memory are intact.  Attention and concentration are normal.    Able to name objects and repeat phrases. Cranial nerves: There is good facial symmetry. Pupils are equal round and reactive to light bilaterally. Fundoscopic exam reveals clear margins bilaterally. Extraocular muscles are intact. The visual fields are full to confrontational testing. The speech is fluent and just mildly dysarthric.  He is hypophonic. Soft palate rises symmetrically and there is no tongue deviation. Hearing is intact to conversational tone. Motor: Strength is 5/5 in the bilateral upper and lower extremities.   Shoulder shrug is equal and symmetric.  There is no pronator drift.  Movement examination: Tone: There is normal tone in the UE and LE bilaterally Abnormal movements: There is no tremor noted today Coordination:  There is no decremation with RAM's today Gait and Station: The patient has minimal difficulty arising out of a deep-seated chair without the use of the hands. The patient's stride length is decreased and he is shuffling some but has not taken his medication since early this morning and was seen in the afternoon.    LABS  Lab Results  Component Value Date   WBC 9.3 09/22/2013   HGB 13.9 09/22/2013   HCT 42.3 09/22/2013   MCV 86.5 09/22/2013   PLT 163.0 09/22/2013   Lab Results  Component Value Date   TSH 10.27* 08/06/2013     Chemistry       Component Value Date/Time   NA 137 09/22/2013 1112   K 3.8 09/22/2013 1112   CL 93* 09/22/2013 1112   CO2 31 09/22/2013 1112   BUN 15 09/22/2013 1112   CREATININE 1.0 09/22/2013 1112   CREATININE 1.04 06/05/2013 1020      Component Value Date/Time   CALCIUM 9.1 09/22/2013 1112   ALKPHOS 59 04/08/2013 0937   AST 18 04/08/2013 0937   ALT 34 04/08/2013 0937   BILITOT 0.7 04/08/2013 0937     Lab Results  Component Value Date   VITAMINB12 268 08/19/2012   Lab Results  Component Value Date   HGBA1C 8.7* 08/06/2013   DBS programming was performed today, which is described in more detail on a separate programming procedural notes.  In short, there was no significant change following programming.  ASSESSMENT/PLAN:  1.  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    -I am going to continue the artane for now since he feels he is doing much better on the medication   -He does not wish to go to the neuro rehabilitation center currently for the Parkinson's program  -Programmed the patient programmer so that he could go up or down by 0.2 V on each side but he has not had to adjust it in quite some time  -I. talked to him about moving his levodopa closer together so that he takes it before breakfast, before lunch and before dinner  -We'll continue his Mirapex 0.5 mg twice a day. 2.  RBD  -He will continue his clonazepam 0.5 mg, to 1-1/2 tablets per night but asked him to take this right before  bedtime as opposed to several hours before bedtime 3.  OSAS, noncompliant with CPAP.  -Unfortunately, he has been unable to tolerate CPAP 4.  Sialorrhea  -This is associated with Parkinson's disease.  He may benefit from Myobloc in the future. 5.  Dysphagia.  -Overall, this is fairly mild but I will give an eye on this and consider a MBE if needed. 6.  CHF  -He is scheduled to have an ICD placed on 11/13/2013 7.  Constipation  -copy of rancho recipe given 8.  Return in about 4  months (around 02/23/2014).

## 2013-10-23 NOTE — Procedures (Signed)
DBS Programming was performed.    Total time spent programming was 30 minutes.  Device was confirmed to be on.  Soft start was confirmed to be on.  Impedences were checked and were within normal limits.  Battery was checked and was determined to be functioning normally and not near the end of life.  Final settings were as follows:  Left brain electrode:     1-2+           ; Amplitude  4.0   V   ; Pulse width 90 microseconds;   Frequency   160   Hz.  Right brain electrode:     4-7+          ; Amplitude   3.9  V ;  Pulse width 90  microseconds;  Frequency   160    Hz.

## 2013-10-27 ENCOUNTER — Encounter: Payer: Self-pay | Admitting: *Deleted

## 2013-10-27 ENCOUNTER — Other Ambulatory Visit: Payer: Self-pay | Admitting: *Deleted

## 2013-10-27 DIAGNOSIS — I5043 Acute on chronic combined systolic (congestive) and diastolic (congestive) heart failure: Secondary | ICD-10-CM

## 2013-10-27 DIAGNOSIS — I255 Ischemic cardiomyopathy: Secondary | ICD-10-CM

## 2013-10-28 ENCOUNTER — Other Ambulatory Visit: Payer: Self-pay | Admitting: Internal Medicine

## 2013-10-28 NOTE — Telephone Encounter (Signed)
OK X1 

## 2013-10-29 NOTE — Telephone Encounter (Signed)
Tramadol has been called to [facility]

## 2013-10-31 ENCOUNTER — Encounter (HOSPITAL_COMMUNITY): Payer: Self-pay | Admitting: Pharmacy Technician

## 2013-11-10 ENCOUNTER — Other Ambulatory Visit (INDEPENDENT_AMBULATORY_CARE_PROVIDER_SITE_OTHER): Payer: Federal, State, Local not specified - PPO | Admitting: *Deleted

## 2013-11-10 DIAGNOSIS — I255 Ischemic cardiomyopathy: Secondary | ICD-10-CM | POA: Diagnosis not present

## 2013-11-10 DIAGNOSIS — I5043 Acute on chronic combined systolic (congestive) and diastolic (congestive) heart failure: Secondary | ICD-10-CM | POA: Diagnosis not present

## 2013-11-10 LAB — CBC WITH DIFFERENTIAL/PLATELET
Basophils Absolute: 0 10*3/uL (ref 0.0–0.1)
Basophils Relative: 0.4 % (ref 0.0–3.0)
EOS ABS: 0.3 10*3/uL (ref 0.0–0.7)
Eosinophils Relative: 3.8 % (ref 0.0–5.0)
HCT: 39.5 % (ref 39.0–52.0)
Hemoglobin: 12.8 g/dL — ABNORMAL LOW (ref 13.0–17.0)
Lymphocytes Relative: 25.2 % (ref 12.0–46.0)
Lymphs Abs: 1.7 10*3/uL (ref 0.7–4.0)
MCHC: 32.4 g/dL (ref 30.0–36.0)
MCV: 87.4 fl (ref 78.0–100.0)
MONOS PCT: 6.8 % (ref 3.0–12.0)
Monocytes Absolute: 0.5 10*3/uL (ref 0.1–1.0)
Neutro Abs: 4.4 10*3/uL (ref 1.4–7.7)
Neutrophils Relative %: 63.8 % (ref 43.0–77.0)
PLATELETS: 152 10*3/uL (ref 150.0–400.0)
RBC: 4.52 Mil/uL (ref 4.22–5.81)
RDW: 14.9 % (ref 11.5–15.5)
WBC: 6.9 10*3/uL (ref 4.0–10.5)

## 2013-11-10 LAB — BASIC METABOLIC PANEL
BUN: 17 mg/dL (ref 6–23)
CO2: 31 mEq/L (ref 19–32)
CREATININE: 1.1 mg/dL (ref 0.4–1.5)
Calcium: 8.7 mg/dL (ref 8.4–10.5)
Chloride: 96 mEq/L (ref 96–112)
GFR: 71.28 mL/min (ref >60.00)
Glucose, Bld: 234 mg/dL — ABNORMAL HIGH (ref 70–99)
Potassium: 3.8 mEq/L (ref 3.5–5.1)
Sodium: 136 mEq/L (ref 135–145)

## 2013-11-12 ENCOUNTER — Other Ambulatory Visit: Payer: Self-pay | Admitting: Internal Medicine

## 2013-11-12 DIAGNOSIS — Z7982 Long term (current) use of aspirin: Secondary | ICD-10-CM | POA: Diagnosis not present

## 2013-11-12 DIAGNOSIS — I2510 Atherosclerotic heart disease of native coronary artery without angina pectoris: Secondary | ICD-10-CM | POA: Diagnosis not present

## 2013-11-12 DIAGNOSIS — Z951 Presence of aortocoronary bypass graft: Secondary | ICD-10-CM | POA: Diagnosis not present

## 2013-11-12 DIAGNOSIS — G20 Parkinson's disease: Secondary | ICD-10-CM | POA: Diagnosis not present

## 2013-11-12 DIAGNOSIS — I255 Ischemic cardiomyopathy: Secondary | ICD-10-CM | POA: Diagnosis not present

## 2013-11-12 DIAGNOSIS — Z79899 Other long term (current) drug therapy: Secondary | ICD-10-CM | POA: Diagnosis not present

## 2013-11-12 DIAGNOSIS — G4733 Obstructive sleep apnea (adult) (pediatric): Secondary | ICD-10-CM | POA: Diagnosis not present

## 2013-11-12 DIAGNOSIS — Z6839 Body mass index (BMI) 39.0-39.9, adult: Secondary | ICD-10-CM | POA: Diagnosis not present

## 2013-11-12 DIAGNOSIS — J45909 Unspecified asthma, uncomplicated: Secondary | ICD-10-CM | POA: Diagnosis not present

## 2013-11-12 DIAGNOSIS — E119 Type 2 diabetes mellitus without complications: Secondary | ICD-10-CM | POA: Diagnosis not present

## 2013-11-12 DIAGNOSIS — I10 Essential (primary) hypertension: Secondary | ICD-10-CM | POA: Diagnosis not present

## 2013-11-12 DIAGNOSIS — I5022 Chronic systolic (congestive) heart failure: Secondary | ICD-10-CM | POA: Diagnosis not present

## 2013-11-12 MED ORDER — SODIUM CHLORIDE 0.9 % IR SOLN
80.0000 mg | Status: DC
  Filled 2013-11-12: qty 2

## 2013-11-12 MED ORDER — VANCOMYCIN HCL 10 G IV SOLR
1500.0000 mg | INTRAVENOUS | Status: DC
  Filled 2013-11-12 (×3): qty 1500

## 2013-11-12 MED ORDER — SODIUM CHLORIDE 0.9 % IV SOLN
INTRAVENOUS | Status: DC
  Administered 2013-11-13: 11:00:00 via INTRAVENOUS

## 2013-11-12 MED ORDER — CHLORHEXIDINE GLUCONATE 4 % EX LIQD
60.0000 mL | Freq: Once | CUTANEOUS | Status: DC
  Filled 2013-11-12: qty 60

## 2013-11-13 ENCOUNTER — Ambulatory Visit (HOSPITAL_COMMUNITY)
Admission: RE | Admit: 2013-11-13 | Discharge: 2013-11-14 | Disposition: A | Discharge status: Home / Self Care | Payer: Federal, State, Local not specified - PPO | Source: Ambulatory Visit | Attending: Internal Medicine | Admitting: Internal Medicine

## 2013-11-13 ENCOUNTER — Encounter (HOSPITAL_COMMUNITY)
Admission: RE | Disposition: A | Discharge status: Home / Self Care | Payer: Federal, State, Local not specified - PPO | Source: Ambulatory Visit | Attending: Internal Medicine

## 2013-11-13 ENCOUNTER — Other Ambulatory Visit: Payer: Self-pay | Admitting: Internal Medicine

## 2013-11-13 DIAGNOSIS — Z951 Presence of aortocoronary bypass graft: Secondary | ICD-10-CM | POA: Diagnosis present

## 2013-11-13 DIAGNOSIS — G4733 Obstructive sleep apnea (adult) (pediatric): Secondary | ICD-10-CM | POA: Diagnosis present

## 2013-11-13 DIAGNOSIS — I10 Essential (primary) hypertension: Secondary | ICD-10-CM | POA: Diagnosis present

## 2013-11-13 DIAGNOSIS — I5022 Chronic systolic (congestive) heart failure: Secondary | ICD-10-CM | POA: Diagnosis not present

## 2013-11-13 DIAGNOSIS — E119 Type 2 diabetes mellitus without complications: Secondary | ICD-10-CM | POA: Insufficient documentation

## 2013-11-13 DIAGNOSIS — E1122 Type 2 diabetes mellitus with diabetic chronic kidney disease: Secondary | ICD-10-CM | POA: Diagnosis present

## 2013-11-13 DIAGNOSIS — Z79899 Other long term (current) drug therapy: Secondary | ICD-10-CM | POA: Insufficient documentation

## 2013-11-13 DIAGNOSIS — I5043 Acute on chronic combined systolic (congestive) and diastolic (congestive) heart failure: Secondary | ICD-10-CM

## 2013-11-13 DIAGNOSIS — I2510 Atherosclerotic heart disease of native coronary artery without angina pectoris: Secondary | ICD-10-CM | POA: Insufficient documentation

## 2013-11-13 DIAGNOSIS — I255 Ischemic cardiomyopathy: Secondary | ICD-10-CM | POA: Insufficient documentation

## 2013-11-13 DIAGNOSIS — Z794 Long term (current) use of insulin: Secondary | ICD-10-CM

## 2013-11-13 DIAGNOSIS — Z9989 Dependence on other enabling machines and devices: Secondary | ICD-10-CM

## 2013-11-13 DIAGNOSIS — G20 Parkinson's disease: Secondary | ICD-10-CM | POA: Insufficient documentation

## 2013-11-13 DIAGNOSIS — G20A1 Parkinson's disease without dyskinesia, without mention of fluctuations: Secondary | ICD-10-CM | POA: Diagnosis present

## 2013-11-13 DIAGNOSIS — J45909 Unspecified asthma, uncomplicated: Secondary | ICD-10-CM | POA: Insufficient documentation

## 2013-11-13 DIAGNOSIS — Z95810 Presence of automatic (implantable) cardiac defibrillator: Secondary | ICD-10-CM | POA: Diagnosis not present

## 2013-11-13 DIAGNOSIS — IMO0001 Reserved for inherently not codable concepts without codable children: Secondary | ICD-10-CM | POA: Diagnosis present

## 2013-11-13 DIAGNOSIS — Z7982 Long term (current) use of aspirin: Secondary | ICD-10-CM | POA: Insufficient documentation

## 2013-11-13 DIAGNOSIS — Z6839 Body mass index (BMI) 39.0-39.9, adult: Secondary | ICD-10-CM | POA: Insufficient documentation

## 2013-11-13 HISTORY — PX: IMPLANTABLE CARDIOVERTER DEFIBRILLATOR IMPLANT: SHX5473

## 2013-11-13 LAB — SURGICAL PCR SCREEN
MRSA, PCR: NEGATIVE
Staphylococcus aureus: NEGATIVE

## 2013-11-13 LAB — GLUCOSE, CAPILLARY: GLUCOSE-CAPILLARY: 162 mg/dL — AB (ref 70–99)

## 2013-11-13 SURGERY — IMPLANTABLE CARDIOVERTER DEFIBRILLATOR IMPLANT
Anesthesia: LOCAL

## 2013-11-13 MED ORDER — LIDOCAINE HCL (PF) 1 % IJ SOLN
INTRAMUSCULAR | Status: AC
  Filled 2013-11-13: qty 30

## 2013-11-13 MED ORDER — MIDAZOLAM HCL 5 MG/5ML IJ SOLN
INTRAMUSCULAR | Status: AC
  Filled 2013-11-13: qty 5

## 2013-11-13 MED ORDER — ASPIRIN EC 81 MG PO TBEC
81.0000 mg | DELAYED_RELEASE_TABLET | Freq: Every day | ORAL | Status: DC
  Administered 2013-11-13 – 2013-11-14 (×2): 81 mg via ORAL
  Filled 2013-11-13 (×2): qty 1

## 2013-11-13 MED ORDER — ATORVASTATIN CALCIUM 40 MG PO TABS
40.0000 mg | ORAL_TABLET | Freq: Every day | ORAL | Status: DC
  Administered 2013-11-13 – 2013-11-14 (×2): 40 mg via ORAL
  Filled 2013-11-13 (×2): qty 1

## 2013-11-13 MED ORDER — MOMETASONE FURO-FORMOTEROL FUM 100-5 MCG/ACT IN AERO
2.0000 | INHALATION_SPRAY | Freq: Two times a day (BID) | RESPIRATORY_TRACT | Status: DC
  Administered 2013-11-13 – 2013-11-14 (×2): 2 via RESPIRATORY_TRACT
  Filled 2013-11-13: qty 8.8

## 2013-11-13 MED ORDER — ALBUTEROL SULFATE (2.5 MG/3ML) 0.083% IN NEBU: 2.5000 mg | INHALATION_SOLUTION | Freq: Four times a day (QID) | RESPIRATORY_TRACT | Status: DC | PRN

## 2013-11-13 MED ORDER — MUPIROCIN 2 % EX OINT
TOPICAL_OINTMENT | CUTANEOUS | Status: AC
  Administered 2013-11-13: 1 via NASAL
  Filled 2013-11-13: qty 22

## 2013-11-13 MED ORDER — TRIHEXYPHENIDYL HCL 2 MG PO TABS
2.0000 mg | ORAL_TABLET | Freq: Two times a day (BID) | ORAL | Status: DC
  Administered 2013-11-13 – 2013-11-14 (×2): 2 mg via ORAL
  Filled 2013-11-13 (×4): qty 1

## 2013-11-13 MED ORDER — CLONAZEPAM 0.5 MG PO TABS
0.5000 mg | ORAL_TABLET | Freq: Every day | ORAL | Status: DC
  Administered 2013-11-13: 0.5 mg via ORAL
  Filled 2013-11-13: qty 1

## 2013-11-13 MED ORDER — CARBIDOPA-LEVODOPA 25-100 MG PO TABS
1.0000 | ORAL_TABLET | Freq: Three times a day (TID) | ORAL | Status: DC
  Administered 2013-11-13 – 2013-11-14 (×3): 1 via ORAL
  Filled 2013-11-13 (×3): qty 1

## 2013-11-13 MED ORDER — LOSARTAN POTASSIUM 50 MG PO TABS
50.0000 mg | ORAL_TABLET | Freq: Every day | ORAL | Status: DC
  Administered 2013-11-13: 50 mg via ORAL
  Filled 2013-11-13: qty 1

## 2013-11-13 MED ORDER — POTASSIUM CHLORIDE CRYS ER 20 MEQ PO TBCR
30.0000 meq | EXTENDED_RELEASE_TABLET | Freq: Every day | ORAL | Status: DC
  Administered 2013-11-14: 30 meq via ORAL
  Filled 2013-11-13 (×2): qty 1

## 2013-11-13 MED ORDER — HEPARIN (PORCINE) IN NACL 2-0.9 UNIT/ML-% IJ SOLN
INTRAMUSCULAR | Status: AC
  Filled 2013-11-13: qty 500

## 2013-11-13 MED ORDER — ACETAMINOPHEN 325 MG PO TABS: 325.0000 mg | ORAL_TABLET | ORAL | Status: DC | PRN

## 2013-11-13 MED ORDER — VANCOMYCIN HCL 10 G IV SOLR
1500.0000 mg | Freq: Two times a day (BID) | INTRAVENOUS | Status: AC
  Administered 2013-11-13: 1500 mg via INTRAVENOUS
  Filled 2013-11-13: qty 1500

## 2013-11-13 MED ORDER — LEVOTHYROXINE SODIUM 25 MCG PO TABS
125.0000 ug | ORAL_TABLET | Freq: Every day | ORAL | Status: DC
  Administered 2013-11-14: 125 ug via ORAL
  Filled 2013-11-13 (×2): qty 1

## 2013-11-13 MED ORDER — ONDANSETRON HCL 4 MG/2ML IJ SOLN: 4.0000 mg | Freq: Four times a day (QID) | INTRAMUSCULAR | Status: DC | PRN

## 2013-11-13 MED ORDER — GLIMEPIRIDE 1 MG PO TABS
2.0000 mg | ORAL_TABLET | Freq: Every day | ORAL | Status: DC
  Administered 2013-11-14: 2 mg via ORAL
  Filled 2013-11-13: qty 2

## 2013-11-13 MED ORDER — VITAMIN B-12 1000 MCG PO TABS
1000.0000 ug | ORAL_TABLET | Freq: Every day | ORAL | Status: DC
  Administered 2013-11-13 – 2013-11-14 (×2): 1000 ug via ORAL
  Filled 2013-11-13 (×2): qty 1

## 2013-11-13 MED ORDER — FUROSEMIDE 80 MG PO TABS
120.0000 mg | ORAL_TABLET | Freq: Every day | ORAL | Status: DC
  Administered 2013-11-13 – 2013-11-14 (×2): 120 mg via ORAL
  Filled 2013-11-13 (×4): qty 1

## 2013-11-13 MED ORDER — METOPROLOL SUCCINATE ER 50 MG PO TB24
50.0000 mg | ORAL_TABLET | Freq: Every day | ORAL | Status: DC
  Administered 2013-11-13: 50 mg via ORAL
  Filled 2013-11-13: qty 1

## 2013-11-13 MED ORDER — FENTANYL CITRATE 0.05 MG/ML IJ SOLN
INTRAMUSCULAR | Status: AC
  Filled 2013-11-13: qty 2

## 2013-11-13 MED ORDER — GLIMEPIRIDE 1 MG PO TABS
1.0000 mg | ORAL_TABLET | Freq: Every day | ORAL | Status: DC
  Administered 2013-11-13: 1 mg via ORAL
  Filled 2013-11-13: qty 1

## 2013-11-13 MED ORDER — NITROGLYCERIN 0.4 MG SL SUBL: 0.4000 mg | SUBLINGUAL_TABLET | SUBLINGUAL | Status: DC | PRN

## 2013-11-13 MED ORDER — MUPIROCIN 2 % EX OINT
TOPICAL_OINTMENT | Freq: Two times a day (BID) | CUTANEOUS | Status: DC
  Administered 2013-11-13: 1 via NASAL
  Filled 2013-11-13: qty 22

## 2013-11-13 MED ORDER — ADULT MULTIVITAMIN W/MINERALS CH
1.0000 | ORAL_TABLET | Freq: Every day | ORAL | Status: DC
  Administered 2013-11-13 – 2013-11-14 (×2): 1 via ORAL
  Filled 2013-11-13 (×2): qty 1

## 2013-11-13 MED ORDER — SPIRONOLACTONE 25 MG PO TABS
25.0000 mg | ORAL_TABLET | Freq: Every day | ORAL | Status: DC
  Administered 2013-11-13 – 2013-11-14 (×2): 25 mg via ORAL
  Filled 2013-11-13 (×2): qty 1

## 2013-11-13 NOTE — Discharge Summary (Signed)
ELECTROPHYSIOLOGY PROCEDURE DISCHARGE SUMMARY    Patient ID: Henry [name],  MRN: [id number], DOB/AGE: October 03, 1950 63 y.o.  Admit date: 11/13/2013 Discharge date: 11/13/2013  Primary Care Physician: [name], MD Primary Cardiologist: Dr Stanford Breed Electrophysiologist: Dr Lovena Le  Primary Discharge Diagnosis:  ICD- MDT, implanted 11/13/13  Secondary Discharge Diagnosis:     ICM- EF 35-40% by echo 5/13,  EF now 25-30%  Type 2 diabetes mellitus with circulatory disorder  HTN (hypertension)  CAD- s/p CABG x 3 2000-patent grafts Sept 2015  OSA on CPAP  Obesity, morbid  PD (Parkinson's disease)  Chronic systolic CHF (congestive heart failure)   Allergies  Allergen Reactions  . Penicillins Rash    Because of a history of documented adverse serious drug reaction;Medi Alert bracelet  is recommended     Procedures This Admission:  1.  Implantation of a single chamber ICD on 11/13/13 by Dr Lovena Le.  The patient received a MDT ICD.  DFT's were successful at 22 J.  There were no immediate post procedure complications. 2.  CXR on 11/14/13 demonstrated no pneumothorax status post device implantation.   Brief HPI: Henry [name] is a 63 y.o. male was referred to electrophysiology in the outpatient setting for consideration of ICD implantation.  Past medical history includes CAD and ICM.  The patient has persistent LV dysfunction despite guideline directed therapy.  Risks, benefits, and alternatives to ICD implantation were reviewed with the patient who wished to proceed.   Hospital Course:  The patient was admitted and underwent implantation of a MDT ICD with details as outlined above.   The pt was monitored on telemetry overnight which demonstrated NSR with PVCs.  Left chest was without hematoma or ecchymosis.  The device was interrogated and found to be functioning normally.  CXR was obtained and demonstrated no pneumothorax status post device implantation.  Wound care,  arm mobility, and restrictions were reviewed with the patient.  Dr Lovena Le examined the patient and considered them stable for discharge to home.   The patient's discharge medications include an ARB and beta blocker.   Discharge Vitals: Blood pressure 111/72, pulse 61, temperature 97.4 F (36.3 C), temperature source Oral, resp. rate 19, height 5\' 8"  (1.727 m), weight 260 lb (117.935 kg), SpO2 90 %.    Labs:   Lab Results  Component Value Date   WBC 6.9 11/10/2013   HGB 12.8* 11/10/2013   HCT 39.5 11/10/2013   MCV 87.4 11/10/2013   PLT 152.0 11/10/2013    Recent Labs Lab 11/10/13 0744  NA 136  K 3.8  CL 96  CO2 31  BUN 17  CREATININE 1.1  CALCIUM 8.7  GLUCOSE 234*    Discharge Medications:    Medication List    ASK your doctor about these medications        albuterol 108 (90 BASE) MCG/ACT inhaler  Commonly known as:  PROVENTIL HFA;VENTOLIN HFA  Inhale 2 puffs into the lungs every 6 (six) hours as needed for wheezing or shortness of breath.     aspirin EC 81 MG tablet  Take 81 mg by mouth daily.     atorvastatin 40 MG tablet  Commonly known as:  LIPITOR  Take 1 tablet (40 mg total) by mouth daily.     carbidopa-levodopa 25-100 MG per tablet  Commonly known as:  SINEMET IR  Take 1 tablet by mouth 3 (three) times daily.     clonazePAM 0.5 MG tablet  Commonly known as:  Bobbye [place]  Take 0.5 mg by mouth at bedtime.     Fluticasone-Salmeterol 250-50 MCG/DOSE Aepb  Commonly known as:  ADVAIR  Inhale 1 puff into the lungs 2 (two) times daily as needed (for copd symptoms).     furosemide 80 MG tablet  Commonly known as:  LASIX  Take 120 mg by mouth daily.     glimepiride 2 MG tablet  Commonly known as:  AMARYL  Take 1-2 mg by mouth 2 (two) times daily. 2mg  in the morning and 1mg  in the evening     ibuprofen 200 MG tablet  Commonly known as:  ADVIL,MOTRIN  Take 800 mg by mouth daily as needed for pain.     levothyroxine 125 MCG tablet  Commonly known as:   SYNTHROID, LEVOTHROID  Take 1 tablet (125 mcg total) by mouth daily before breakfast.     losartan 50 MG tablet  Commonly known as:  COZAAR  Take 50 mg by mouth at bedtime.     metoprolol succinate 50 MG 24 hr tablet  Commonly known as:  TOPROL-XL  Take 1 tablet (50 mg total) by mouth at bedtime. Take with or immediately following a meal.     multivitamin with minerals Tabs tablet  Take 1 tablet by mouth daily. Antioxidant  Multi vitamin     nitroGLYCERIN 0.4 MG SL tablet  Commonly known as:  NITROSTAT  Place 1 tablet (0.4 mg total) under the tongue every 5 (five) minutes as needed for chest pain.     potassium chloride 10 MEQ tablet  Commonly known as:  K-DUR,KLOR-CON  Take 30 mEq by mouth daily.     pramipexole 0.5 MG tablet  Commonly known as:  MIRAPEX  Take 0.5 mg by mouth 2 (two) times daily.     sitaGLIPtin-metformin 50-1000 MG per tablet  Commonly known as:  JANUMET  Take 1 tablet by mouth 2 (two) times daily with a meal.     JANUMET 50-1000 MG per tablet  Generic drug:  sitaGLIPtin-metformin  TAKE 1 TABLET TWICE A DAY WITH LARGEST MEALS     spironolactone 25 MG tablet  Commonly known as:  ALDACTONE  Take 1 tablet (25 mg total) by mouth daily.     traMADol 50 MG tablet  Commonly known as:  ULTRAM  TAKE 1 TABLET TWICE A DAY AS NEEDED     travoprost (benzalkonium) 0.004 % ophthalmic solution  Commonly known as:  TRAVATAN  Place 1 drop into the right eye at bedtime.     trihexyphenidyl 2 MG tablet  Commonly known as:  ARTANE  Take 2 mg by mouth 2 (two) times daily with a meal.     vitamin B-12 1000 MCG tablet  Commonly known as:  CYANOCOBALAMIN  Take 1,000 mcg by mouth daily.        Disposition:     Duration of Discharge Encounter: less than 30 minutes including physician time.  Signed,  Cristopher Peru, M.D.

## 2013-11-13 NOTE — Telephone Encounter (Signed)
Tramadol phoned to [id number]

## 2013-11-13 NOTE — Telephone Encounter (Signed)
OK X1 

## 2013-11-13 NOTE — CV Procedure (Signed)
SURGEON:  Cristopher Peru, MD      PREPROCEDURE DIAGNOSES:   1. Ischemic cardiomyopathy.   2. New York Heart Association class III, heart failure chronically.      POSTPROCEDURE DIAGNOSES:   1. Ischemic cardiomyopathy.   2. New York Heart Association class III heart failure chronically.      PROCEDURES:    1. ICD implantation.  2. Defibrillation threshold testing     INTRODUCTION:  Henry [name] is a 64 y.o. male with an ischemic CM (EF 25%), NYHA Class III CHF, and CAD. At this time, he meets MADIT II/ SCD-HeFT criteria for ICD implantation for primary prevention of sudden death.  The patient has a narrow QRS and does not meet criteria for revascularization.  The patient has been treated with an optimal medical regimen but continues to have a depressed ejection fraction and NYHA Class III CHF symptoms.  The patient therefore  presents today for ICD implantation.      DESCRIPTION OF PROCEDURE:  Informed written consent was obtained and the patient was brought to the electrophysiology lab in the fasting state. The patient was adequately sedated with intravenous Versed, and fentanyl as outlined in the nursing report.  The patient's left chest was prepped and draped in the usual sterile fashion by the EP lab staff.  The skin overlying the left deltopectoral region was infiltrated with lidocaine for local analgesia.  A 5-cm incision was made over the left deltopectoral region.  A left subcutaneous defibrillator pocket was fashioned using a combination of sharp and blunt dissection.  Electrocautery was used to assure hemostasis.   Left Upper extremity Venography:  A venogram of the left upper extremity was performed which revealed a moderate sized left axillary vein which emptied into a moderate sized left subclavian vein.    RA/RV Lead Placement: The left axillary vein was cannulated with fluoroscopic visualization.  No contrast was required for this endeavor.  Through the left axillary vein, a  Medtronic 5076 52 cm active fixation  (serial # [id number]  ) right atrial lead and a Medtronic MRI compatible (serial number [id number]) right ventricular defibrillator lead were advanced with fluoroscopic visualization into the right atrial appendage and right ventricular apical septal positions respectively.  Initial atrial lead P-waves measured 1.4 mV with an impedance of 537 ohms and a threshold of 1.6 volts at 0.5 milliseconds.  The right ventricular lead R-wave measured 16 mV with impedance of 677 ohms and a threshold of 0.6 volts at 0.5 milliseconds.   The leads were secured to the pectoralis  fascia using #2 silk suture over the suture sleeves.  The pocket then  irrigated with copious gentamicin solution.  The leads were then  connected to a Medtronic MRI compatible (serial  Number [id number]) ICD.  The defibrillator was placed into the  pocket.  The pocket was then closed in 2 layers with 2.0 Vicryl suture  for the subcutaneous and subcuticular layers.  Steri-Strips and a  sterile dressing were then applied.   DFT Testing: Defibrillation Threshold testing was then performed. Ventricular fibrillation was induced with a T shock.  Adequate sensing of ventricular fibrillation was observed with minimal dropout with a programmed sensitivity of 1.22mV.  The patient was successfully defibrillated to sinus rhythm with a single 20 joules shock delivered from the device with an impedance of 64 ohms in a duration of 8 seconds.  The patient remained in sinus rhythm thereafter.  There were no early apparent complications.  CONCLUSIONS:   1. Ischemic cardiomyopathy with chronic New York Heart Association class III heart failure.   2. Successful ICD implantation.   3. DFT less than or equal to 20 joules.   4. No early apparent complications.   Cristopher Peru, MD  1:14 PM 11/13/2013

## 2013-11-13 NOTE — Interval H&P Note (Signed)
History and Physical Interval Note: Patient seen and examined. I have reviewed the deep brain stimulator (medtronic) and his other neurological diagnosis and need for subsequent MRI scans of the brain. We will plan to implant a MRI compatible Medtronic ICD for which the lead and device are compatible with MRI scanning.  11/13/2013 11:28 AM  Henry [name]  has presented today for surgery, with the diagnosis of CHF  The various methods of treatment have been discussed with the patient and family. After consideration of risks, benefits and other options for treatment, the patient has consented to  Procedure(s): IMPLANTABLE CARDIOVERTER DEFIBRILLATOR IMPLANT (N/A) as a surgical intervention .  The patient's history has been reviewed, patient examined, no change in status, stable for surgery.  I have reviewed the patient's chart and labs.  Questions were answered to the patient's satisfaction.     Cristopher Peru

## 2013-11-13 NOTE — Progress Notes (Signed)
Arm sling applied to left arm. Instructions reviewed w/patient.

## 2013-11-13 NOTE — Interval H&P Note (Signed)
History and Physical Interval Note:  11/13/2013 11:27 AM  Henry [name]  has presented today for surgery, with the diagnosis of CHF  The various methods of treatment have been discussed with the patient and family. After consideration of risks, benefits and other options for treatment, the patient has consented to  Procedure(s): IMPLANTABLE CARDIOVERTER DEFIBRILLATOR IMPLANT (N/A) as a surgical intervention .  The patient's history has been reviewed, patient examined, no change in status, stable for surgery.  I have reviewed the patient's chart and labs.  Questions were answered to the patient's satisfaction.     Mikle Bosworth.D.

## 2013-11-13 NOTE — H&P (View-Only) (Signed)
HPI Henry [name] returns today for followup. He is a very pleasant middle aged man with an ICM, chronic class 3 CHF, EF 25-30% by echo, and Parkinson's disease s/p deep brain stimulator. He has been stable since his heart cath. He has sleep apnea but cannot tolerate CPAP. He tells me he has tried multiple configurations. He denies anginal symptoms. He has never had syncope. His left ventricular dysfunction has been long-standing, for several years.  Allergies  Allergen Reactions  . Penicillins Rash    Because of a history of documented adverse serious drug reaction;Medi Alert bracelet  is recommended     Current Outpatient Prescriptions  Medication Sig Dispense Refill  . ADVAIR DISKUS 250-50 MCG/DOSE AEPB Inhale 1 puff into the lungs daily.       Marland Kitchen aspirin EC 81 MG tablet Take 81 mg by mouth daily.      Marland Kitchen atorvastatin (LIPITOR) 40 MG tablet Take 1 tablet (40 mg total) by mouth daily.  90 tablet  3  . carbidopa-levodopa (SINEMET IR) 25-100 MG per tablet Take 1 tablet by mouth 3 (three) times daily.  90 tablet  5  . clonazePAM (KLONOPIN) 0.5 MG tablet Take 0.5 mg by mouth at bedtime.       . furosemide (LASIX) 80 MG tablet Take 120 mg by mouth daily.      Marland Kitchen glimepiride (AMARYL) 2 MG tablet Take 1-2 mg by mouth 2 (two) times daily. 2mg  in the morning and 1mg  in the evening      . glucose blood (ONE TOUCH ULTRA TEST) test strip Check blood sugar once a day  100 each  12  . ibuprofen (ADVIL,MOTRIN) 200 MG tablet Take 800 mg by mouth daily as needed for pain.      Marland Kitchen JANUMET 50-1000 MG per tablet Take 1 tablet by mouth 2 (two) times daily.      Marland Kitchen KLOR-CON M10 10 MEQ tablet TAKE 3 TABLETS BY MOUTH DAILY  90 tablet  1  . levothyroxine (SYNTHROID, LEVOTHROID) 125 MCG tablet Take 1 tablet (125 mcg total) by mouth daily before breakfast.  90 tablet  1  . losartan (COZAAR) 50 MG tablet Take 50 mg by mouth at bedtime.      . metoprolol succinate (TOPROL-XL) 50 MG 24 hr tablet Take 1 tablet (50 mg  total) by mouth at bedtime. Take with or immediately following a meal.  30 tablet  06  . Multiple Vitamin (MULTIVITAMIN WITH MINERALS) TABS tablet Take 1 tablet by mouth daily. Antioxidant  Multi vitamin      . nitroGLYCERIN (NITROSTAT) 0.4 MG SL tablet Place 1 tablet (0.4 mg total) under the tongue every 5 (five) minutes as needed for chest pain.  25 tablet  2  . ONETOUCH DELICA LANCETS FINE MISC Use as directed  100 each  11  . pramipexole (MIRAPEX) 0.5 MG tablet Take 0.5 mg by mouth 2 (two) times daily.      Marland Kitchen spironolactone (ALDACTONE) 25 MG tablet Take 1 tablet (25 mg total) by mouth daily.  90 tablet  3  . traMADol (ULTRAM) 50 MG tablet TAKE 1 TABLET BY MOUTH TWICE A DAY AS NEEDED  30 tablet  0  . travoprost, benzalkonium, (TRAVATAN) 0.004 % ophthalmic solution Place 1 drop into the right eye at bedtime.       . trihexyphenidyl (ARTANE) 2 MG tablet Take 2 mg by mouth 2 (two) times daily with a meal.      . VENTOLIN  HFA 108 (90 BASE) MCG/ACT inhaler Inhale 1 puff into the lungs daily.       . vitamin B-12 (CYANOCOBALAMIN) 1000 MCG tablet Take 1,000 mcg by mouth daily.       No current facility-administered medications for this visit.     Past Medical History  Diagnosis Date  . Cardiomyopathy   . CAD (coronary artery disease)   . HTN (hypertension)     pt denies 08/19/12  . MI (myocardial infarction)   . Sleep apnea   . GERD (gastroesophageal reflux disease)   . Hyperthyroidism   . Hyperplasia, prostate   . Benign neoplasm of colon   . Nephrolithiasis   . Ventral hernia   . HLD (hyperlipidemia)   . Parkinson disease     1999  . OSA (obstructive sleep apnea)     AHI-28,on CPAP, noncompliant with CPAP  . Sleep apnea, organic   . Complication of anesthesia     pt states that he got a rash  . Arthritis   . Shortness of breath     Hx: of at all times  . UTI (lower urinary tract infection) 09/15/12    Klebsiella  . Asthma   . DM (diabetes mellitus)     TYPE 2     ROS:    All systems reviewed and negative except as noted in the HPI.   Past Surgical History  Procedure Laterality Date  . Coronary artery bypass graft  2000    [name], MD  . Coronary stent placement  1998  . Median sternotomy  2000  . Deep brain stimulator placement  2004    Right and left VIN stimulator placement   . Acoustic neuroma resection  1981  . Finger amputation      left pointer  . Cataract extraction w/ intraocular lens implant      Hx: of right eye  . Tonsillectomy    . Colonoscopy w/ biopsies and polypectomy      Hx: of  . Cardiac catheterization    . Subthalamic stimulator battery replacement N/A 09/05/2012    Procedure: Deep brain stimulator battery change;  Surgeon: Erline Levine, MD;  Location: [facility];  Service: Neurosurgery;  Laterality: N/A;  Deep brain stimulator battery change     Family History  Problem Relation Age of Onset  . Peripheral vascular disease    . Arthritis    . Diabetes    . Alcoholism Mother 44    deceased   . Aneurysm Father 10  . Autoimmune disease Brother 82    AIDS     History   Social History  . Marital Status: Married    Spouse Name: [name]    Number of Children: 2  . Years of Education: N/A   Occupational History  . 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 Smoking status: Never Smoker   . Smokeless tobacco: Never Used  . Alcohol Use: Yes     Comment: occasional wine  . Drug Use: No  . Sexual Activity: Not on file   Other Topics Concern  . Not on file   Social History Narrative  . No narrative on file     BP 110/70  Pulse 74  Ht 5\' 9"  (1.753 m)  Wt 267 lb (121.11 kg)  BMI 39.41 kg/m2  Physical Exam:  Stable appearing middle-aged man, obese, NAD HEENT: Unremarkable Neck:  No JVD, no thyromegally Back:  No CVA  tenderness Lungs:  Clear with no wheezes, rales, or rhonchi. HEART:  Regular rate rhythm, no murmurs, no rubs, no clicks Abd:  soft, obese, positive bowel  sounds, no organomegally, no rebound, no guarding Ext:  2 plus pulses, no edema, no cyanosis, no clubbing Skin:  No rashes no nodules Neuro:  CN II through XII intact, motor grossly intact   Assess/Plan:

## 2013-11-13 NOTE — Progress Notes (Signed)
UR Completed Ottie Neglia Graves-Bigelow, RN,BSN [phone]  

## 2013-11-13 NOTE — H&P (Signed)
  ICD Criteria  Current LVEF:25% ;Obtained > or = 1 month ago and < or = 3 months ago.  NYHA Functional Classification: Class III  Heart Failure History:  Yes, Duration of heart failure since onset is > 9 months  Non-Ischemic Dilated Cardiomyopathy History:  No.  Atrial Fibrillation/Atrial Flutter:  No.  Ventricular Tachycardia History:  No.  Cardiac Arrest History:  No  History of Syndromes with Risk of Sudden Death:  No.  Previous ICD:  No.  Electrophysiology Study: No.  Prior MI: Yes, Most recent MI timeframe is > 40 days.  PPM: No.  OSA:  Yes  Patient Life Expectancy of >=1 year: Yes.  Anticoagulation Therapy:  Patient is NOT on anticoagulation therapy.   Beta Blocker Therapy:  Yes.   Ace Inhibitor/ARB Therapy:  Yes.

## 2013-11-14 ENCOUNTER — Encounter (HOSPITAL_COMMUNITY): Payer: Self-pay

## 2013-11-14 ENCOUNTER — Ambulatory Visit (HOSPITAL_COMMUNITY): Payer: Federal, State, Local not specified - PPO

## 2013-11-14 DIAGNOSIS — Z95810 Presence of automatic (implantable) cardiac defibrillator: Secondary | ICD-10-CM | POA: Diagnosis not present

## 2013-11-14 DIAGNOSIS — I5022 Chronic systolic (congestive) heart failure: Secondary | ICD-10-CM | POA: Diagnosis not present

## 2013-11-14 DIAGNOSIS — I255 Ischemic cardiomyopathy: Secondary | ICD-10-CM | POA: Diagnosis not present

## 2013-11-14 LAB — GLUCOSE, CAPILLARY: GLUCOSE-CAPILLARY: 217 mg/dL — AB (ref 70–99)

## 2013-11-14 MED ORDER — ACETAMINOPHEN 325 MG PO TABS: 325.0000 mg | ORAL_TABLET | ORAL | Status: DC | PRN

## 2013-11-14 MED ORDER — SITAGLIPTIN PHOS-METFORMIN HCL 50-1000 MG PO TABS: 1.0000 | ORAL_TABLET | Freq: Two times a day (BID) | ORAL | Status: DC

## 2013-11-14 NOTE — Plan of Care (Signed)
Problem: Phase II Progression Outcomes Goal: Pain controlled Outcome: Progressing                  

## 2013-11-14 NOTE — Progress Notes (Signed)
    Subjective:  Didn't sleep well, no cardiac complaints   Objective:  Vital Signs in the last 24 hours: Temp:  [97.4 F (36.3 C)-98.1 F (36.7 C)] 98.1 F (36.7 C) (11/06 0525) Pulse Rate:  [61-71] 66 (11/06 0525) Resp:  [15-22] 20 (11/06 0622) BP: (111-136)/(62-82) 123/76 mmHg (11/06 0622) SpO2:  [87 %-100 %] 96 % (11/06 0622) Weight:  [260 lb (117.935 kg)-260 lb 8 oz (118.162 kg)] 260 lb 8 oz (118.162 kg) (11/06 0525)  Intake/Output from previous day:  Intake/Output Summary (Last 24 hours) at 11/14/13 0811 Last data filed at 11/13/13 2030  Gross per 24 hour  Intake    240 ml  Output      0 ml  Net    240 ml    Physical Exam: General appearance: alert, cooperative, no distress and moderately obese Lungs: clear to auscultation bilaterally and ICD site without hematoma Heart: regular rate and rhythm   Rate: 70  Rhythm: normal sinus rhythm and premature ventricular contractions (PVC)  Lab Results: No results for input(s): WBC, HGB, PLT in the last 72 hours. No results for input(s): NA, K, CL, CO2, GLUCOSE, BUN, CREATININE in the last 72 hours. No results for input(s): TROPONINI in the last 72 hours.  Invalid input(s): CK, MB No results for input(s): INR in the last 72 hours.  Imaging: Dg Chest 2 View  11/14/2013   CLINICAL DATA:  Status post defibrillator placement  EXAM: CHEST  2 VIEW  COMPARISON:  05/21/2013  FINDINGS: Postsurgical changes are again seen. A a stimulating device is noted over the right chest. A new defibrillator is seen without evidence of pneumothorax. No acute bony abnormality is seen. No infiltrate is noted.  IMPRESSION: No pneumothorax following defibrillator placement   Electronically Signed   By: Inez Catalina M.D.   On: 11/14/2013 07:17    Cardiac Studies:ICD interrogation demonstrates normal function.  Assessment/Plan:   Principal Problem:   ICD- MDT, implanted 11/13/13 Active Problems:   Cardiomyopathy, ischemic- EF 35-40% by echo 5/13,  but 05/2013 EF now 25-30%   Type 2 diabetes mellitus with circulatory disorder   HTN (hypertension)   CAD- s/p CABG x 3 2000-   OSA on CPAP   Obesity, morbid   PD (Parkinson's disease)   Chronic systolic CHF (congestive heart failure)    PLAN: Should be OK for discharge. I will arrange site check in 10 days, Dr Lovena Le in 3 months.   Kerin Ransom PA-C Beeper [phone] 11/14/2013, 8:11 AM  EP attending Patient seen and examined. I agree with above findings. He is stable for discharge. Usual follow-up.  Cristopher Peru, M.D.

## 2013-11-14 NOTE — Discharge Instructions (Signed)
Supplemental Discharge Instructions for  Pacemaker/Defibrillator Patients  Activity No heavy lifting or vigorous activity with your left/right arm for 6 to 8 weeks.  Do not raise your left/right arm above your head for one week.  Gradually raise your affected arm as drawn below.           __  NO DRIVING for     ; you may begin driving on     .  WOUND CARE - Keep the wound area clean and dry.  Do not get this area wet for one week. No showers for one week; you may shower on     . - The tape/steri-strips on your wound will fall off; do not pull them off.  No bandage is needed on the site.  DO  NOT apply any creams, oils, or ointments to the wound area. - If you notice any drainage or discharge from the wound, any swelling or bruising at the site, or you develop a fever > 101? F after you are discharged home, call the office at once.  Special Instructions - You are still able to use cellular telephones; use the ear opposite the side where you have your pacemaker/defibrillator.  Avoid carrying your cellular phone near your device. - When traveling through airports, show security personnel your identification card to avoid being screened in the metal detectors.  Ask the security personnel to use the hand wand. - Avoid arc welding equipment, MRI testing (magnetic resonance imaging), TENS units (transcutaneous nerve stimulators).  Call the office for questions about other devices. - Avoid electrical appliances that are in poor condition or are not properly grounded. - Microwave ovens are safe to be near or to operate.  Additional information for defibrillator patients should your device go off: - If your device goes off ONCE and you feel fine afterward, notify the device clinic nurses. - If your device goes off ONCE and you do not feel well afterward, call 911. - If your device goes off TWICE, call 911. - If your device goes off THREE times in one day, call 911.  DO NOT DRIVE YOURSELF OR  A FAMILY MEMBER WITH A DEFIBRILLATOR TO THE HOSPITAL--CALL 911.    Heart Failure Heart failure means your heart has trouble pumping blood. This makes it hard for your body to work well. Heart failure is usually a long-term (chronic) condition. You must take good care of yourself and follow your doctor's treatment plan. HOME CARE  Take your heart medicine as told by your doctor.  Do not stop taking medicine unless your doctor tells you to.  Do not skip any dose of medicine.  Refill your medicines before they run out.  Take other medicines only as told by your doctor or pharmacist.  Stay active if told by your doctor. The elderly and people with severe heart failure should talk with a doctor about physical activity.  Eat heart-healthy foods. Choose foods that are without trans fat and are low in saturated fat, cholesterol, and salt (sodium). This includes fresh or frozen fruits and vegetables, fish, lean meats, fat-free or low-fat dairy foods, whole grains, and high-fiber foods. Lentils and dried peas and beans (legumes) are also good choices.  Limit salt if told by your doctor.  Cook in a healthy way. Roast, grill, broil, bake, poach, steam, or stir-fry foods.  Limit fluids as told by your doctor.  Weigh yourself every morning. Do this after you pee (urinate) and before you eat breakfast. Write  down your weight to give to your doctor.  Take your blood pressure and write it down if your doctor tells you to.  Ask your doctor how to check your pulse. Check your pulse as told.  Lose weight if told by your doctor.  Stop smoking or chewing tobacco. Do not use gum or patches that help you quit without your doctor's approval.  Schedule and go to doctor visits as told.  Nonpregnant women should have no more than 1 drink a day. Men should have no more than 2 drinks a day. Talk to your doctor about drinking alcohol.  Stop illegal drug use.  Stay current with shots  (immunizations).  Manage your health conditions as told by your doctor.  Learn to manage your stress.  Rest when you are tired.  If it is really hot outside:  Avoid intense activities.  Use air conditioning or fans, or get in a cooler place.  Avoid caffeine and alcohol.  Wear loose-fitting, lightweight, and light-colored clothing.  If it is really cold outside:  Avoid intense activities.  Layer your clothing.  Wear mittens or gloves, a hat, and a scarf when going outside.  Avoid alcohol.  Learn about heart failure and get support as needed.  Get help to maintain or improve your quality of life and your ability to care for yourself as needed. GET HELP IF:   You gain 03 lb/1.4 kg or more in 1 day or 05 lb/2.3 kg in a week.  You are more short of breath than usual.  You cannot do your normal activities.  You tire easily.  You cough more than normal, especially with activity.  You have any or more puffiness (swelling) in areas such as your hands, feet, ankles, or belly (abdomen).  You cannot sleep because it is hard to breathe.  You feel like your heart is beating fast (palpitations).  You get dizzy or light-headed when you stand up. GET HELP RIGHT AWAY IF:   You have trouble breathing.  There is a change in mental status, such as becoming less alert or not being able to focus.  You have chest pain or discomfort.  You faint. MAKE SURE YOU:   Understand these instructions.  Will watch your condition.  Will get help right away if you are not doing well or get worse. Document Released: 10/05/2007 Document Revised: 05/12/2013 Document Reviewed: 02/12/2012 [facility] Patient Information 2015 [place], [place]. This information is not intended to replace advice given to you by your health care provider. Make sure you discuss any questions you have with your health care provider.

## 2013-11-20 LAB — HM DIABETES EYE EXAM

## 2013-11-24 ENCOUNTER — Ambulatory Visit (INDEPENDENT_AMBULATORY_CARE_PROVIDER_SITE_OTHER): Payer: Federal, State, Local not specified - PPO | Admitting: *Deleted

## 2013-11-24 DIAGNOSIS — I5022 Chronic systolic (congestive) heart failure: Secondary | ICD-10-CM | POA: Diagnosis not present

## 2013-11-24 LAB — MDC_IDC_ENUM_SESS_TYPE_INCLINIC
Brady Statistic AP VP Percent: 0.01 %
Brady Statistic AP VS Percent: 2.8 %
Brady Statistic AS VP Percent: 0.03 %
Brady Statistic RA Percent Paced: 2.8 %
Brady Statistic RV Percent Paced: 0.03 %
HIGH POWER IMPEDANCE MEASURED VALUE: 190 Ohm
HIGH POWER IMPEDANCE MEASURED VALUE: 53 Ohm
Lead Channel Impedance Value: 437 Ohm
Lead Channel Pacing Threshold Amplitude: 0.5 V
Lead Channel Pacing Threshold Pulse Width: 0.4 ms
Lead Channel Pacing Threshold Pulse Width: 0.4 ms
Lead Channel Sensing Intrinsic Amplitude: 15.125 mV
Lead Channel Sensing Intrinsic Amplitude: 15.875 mV
Lead Channel Sensing Intrinsic Amplitude: 2.125 mV
Lead Channel Setting Pacing Amplitude: 3.5 V
Lead Channel Setting Pacing Pulse Width: 0.4 ms
Lead Channel Setting Sensing Sensitivity: 0.3 mV
MDC IDC MSMT BATTERY REMAINING LONGEVITY: 134 mo
MDC IDC MSMT BATTERY VOLTAGE: 3.14 V
MDC IDC MSMT LEADCHNL RA IMPEDANCE VALUE: 437 Ohm
MDC IDC MSMT LEADCHNL RA PACING THRESHOLD AMPLITUDE: 1 V
MDC IDC MSMT LEADCHNL RA SENSING INTR AMPL: 1.875 mV
MDC IDC SESS DTM: 20151116142840
MDC IDC SET LEADCHNL RV PACING AMPLITUDE: 3.5 V
MDC IDC SET ZONE DETECTION INTERVAL: 350 ms
MDC IDC STAT BRADY AS VS PERCENT: 97.17 %
Zone Setting Detection Interval: 300 ms
Zone Setting Detection Interval: 360 ms
Zone Setting Detection Interval: 450 ms

## 2013-11-24 NOTE — Progress Notes (Signed)
HPI: FU coronary artery disease status post bypass and graft, Parkinson's, diabetes and hypertension as well as hyperlipidemia. Carotid Dopplers in January 2005 showed 0-39% stenosis. Last nuclear study 6/15 showed EF 41, prior MI; no ischemia. Echo 8/15 showed EF 30-35, moderate LAE. TSH 7/15 10.27. Cardiac catheterization September 2015 showed a pulmonary Wedge pressure of 16 and PA pressure 32/20. The LAD was occluded. No obstructive disease in the circumflex. The right coronary had severe diffuse distal disease. Saphenous vein graft to the PDA was patent. Saphenous vein graft to diagonal patent. LIMA to the mid LAD patent. Medical therapy recommended. Had ICD placed November 2015. Since last seen, He has some dyspnea on exertion but no orthopnea or PND. His pedal edema is controlled. No chest pain.  Current Outpatient Prescriptions  Medication Sig Dispense Refill  . acetaminophen (TYLENOL) 325 MG tablet Take 1-2 tablets (325-650 mg total) by mouth every 4 (four) hours as needed for mild pain.    Marland Kitchen albuterol (PROVENTIL HFA;VENTOLIN HFA) 108 (90 BASE) MCG/ACT inhaler Inhale 2 puffs into the lungs every 6 (six) hours as needed for wheezing or shortness of breath.    Marland Kitchen aspirin EC 81 MG tablet Take 81 mg by mouth daily.    Marland Kitchen atorvastatin (LIPITOR) 40 MG tablet Take 1 tablet (40 mg total) by mouth daily. 90 tablet 3  . carbidopa-levodopa (SINEMET IR) 25-100 MG per tablet Take 1 tablet by mouth 3 (three) times daily. 90 tablet 5  . clonazePAM (KLONOPIN) 0.5 MG tablet Take 0.5 mg by mouth at bedtime.     . Fluticasone-Salmeterol (ADVAIR) 250-50 MCG/DOSE AEPB Inhale 1 puff into the lungs 2 (two) times daily as needed (for copd symptoms).    . furosemide (LASIX) 80 MG tablet Take 120 mg by mouth daily.    Marland Kitchen glimepiride (AMARYL) 2 MG tablet Take 1-2 mg by mouth 2 (two) times daily. 2mg  in the morning and 1mg  in the evening    . ibuprofen (ADVIL,MOTRIN) 200 MG tablet Take 800 mg by mouth daily as  needed for pain.    Marland Kitchen levothyroxine (SYNTHROID, LEVOTHROID) 125 MCG tablet Take 1 tablet (125 mcg total) by mouth daily before breakfast. 90 tablet 1  . losartan (COZAAR) 50 MG tablet Take 50 mg by mouth at bedtime.    . metoprolol succinate (TOPROL-XL) 50 MG 24 hr tablet Take 1 tablet (50 mg total) by mouth at bedtime. Take with or immediately following a meal. 30 tablet 06  . Multiple Vitamin (MULTIVITAMIN WITH MINERALS) TABS tablet Take 1 tablet by mouth daily. Antioxidant  Multi vitamin    . nitroGLYCERIN (NITROSTAT) 0.4 MG SL tablet Place 1 tablet (0.4 mg total) under the tongue every 5 (five) minutes as needed for chest pain. 25 tablet 2  . potassium chloride (K-DUR,KLOR-CON) 10 MEQ tablet Take 30 mEq by mouth daily.    . pramipexole (MIRAPEX) 0.5 MG tablet Take 0.5 mg by mouth 2 (two) times daily.    . sitaGLIPtin-metformin (JANUMET) 50-1000 MG per tablet Take 1 tablet by mouth 2 (two) times daily with a meal. 60 tablet 5  . spironolactone (ALDACTONE) 25 MG tablet Take 1 tablet (25 mg total) by mouth daily. 90 tablet 3  . traMADol (ULTRAM) 50 MG tablet TAKE 1 TABLET TWICE A DAY AS NEEDED 60 tablet 0  . travoprost, benzalkonium, (TRAVATAN) 0.004 % ophthalmic solution Place 1 drop into the right eye at bedtime.     . trihexyphenidyl (ARTANE) 2 MG tablet Take 2  mg by mouth 2 (two) times daily with a meal.    . vitamin B-12 (CYANOCOBALAMIN) 1000 MCG tablet Take 1,000 mcg by mouth daily.     No current facility-administered medications for this visit.     Past Medical History  Diagnosis Date  . Cardiomyopathy   . CAD (coronary artery disease)   . HTN (hypertension)     pt denies 08/19/12  . MI (myocardial infarction)   . Sleep apnea   . GERD (gastroesophageal reflux disease)   . Hyperthyroidism   . Hyperplasia, prostate   . Benign neoplasm of colon   . Nephrolithiasis   . Ventral hernia   . HLD (hyperlipidemia)   . Parkinson disease     1999  . OSA (obstructive sleep apnea)      AHI-28,on CPAP, noncompliant with CPAP  . Sleep apnea, organic   . Complication of anesthesia     pt states that he got a rash  . Arthritis   . Shortness of breath     Hx: of at all times  . UTI (lower urinary tract infection) 09/15/12    Klebsiella  . Asthma   . DM (diabetes mellitus)     TYPE 2     Past Surgical History  Procedure Laterality Date  . Coronary artery bypass graft  2000    [name], MD  . Coronary stent placement  1998  . Median sternotomy  2000  . Deep brain stimulator placement  2004    Right and left VIN stimulator placement   . Acoustic neuroma resection  1981  . Finger amputation      left pointer  . Cataract extraction w/ intraocular lens implant      Hx: of right eye  . Tonsillectomy    . Colonoscopy w/ biopsies and polypectomy      Hx: of  . Cardiac catheterization    . Subthalamic stimulator battery replacement N/A 09/05/2012    Procedure: Deep brain stimulator battery change;  Surgeon: Erline Levine, MD;  Location: [facility];  Service: Neurosurgery;  Laterality: N/A;  Deep brain stimulator battery change    History   Social History  . Marital Status: Married    Spouse Name: [name]    Number of Children: 2  . Years of Education: N/A   Occupational History  . 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 Smoking status: Never Smoker   . Smokeless tobacco: Never Used  . Alcohol Use: Yes     Comment: occasional wine  . Drug Use: No  . Sexual Activity: Not on file   Other Topics Concern  . Not on file   Social History Narrative    ROS: no fevers or chills, productive cough, hemoptysis, dysphasia, odynophagia, melena, hematochezia, dysuria, hematuria, rash, seizure activity, orthopnea, PND, pedal edema, claudication. Remaining systems are negative.  Physical Exam: Well-developed well-nourished in no acute distress.  Skin is warm and dry.  HEENT is normal.  Neck is supple.  Chest is clear to  auscultation with normal expansion. ICD left chest. Cardiovascular exam is regular rate and rhythm.  Abdominal exam nontender or distended. No masses palpated. Extremities show trace edema. neuro grossly intact

## 2013-11-24 NOTE — Progress Notes (Signed)
Wound check appointment. Steri-strips removed. Wound without redness or edema. Incision edges approximated, wound well healed. Normal device function. Thresholds, sensing, and impedances consistent with implant measurements. Device programmed at 3.5V for extra safety margin until 3 month visit. Histogram distribution appropriate for patient and level of activity. No mode switches or ventricular arrhythmias noted. Patient educated about wound care, arm mobility, lifting restrictions, shock plan. ROV in 3 months with GT. 

## 2013-11-27 ENCOUNTER — Other Ambulatory Visit: Payer: Self-pay | Admitting: Internal Medicine

## 2013-11-27 NOTE — Telephone Encounter (Signed)
#  [phone] 1 bid prn only

## 2013-11-27 NOTE — Telephone Encounter (Signed)
Tramadol has been called to [facility] 

## 2013-11-27 NOTE — Telephone Encounter (Signed)
I thought this had been filled very recently; please verify. Thank you

## 2013-11-27 NOTE — Telephone Encounter (Signed)
11.5.15 #30 phoned to pharmacy. Directions take 1 tablet bid as needed

## 2013-11-28 ENCOUNTER — Ambulatory Visit (INDEPENDENT_AMBULATORY_CARE_PROVIDER_SITE_OTHER): Payer: Federal, State, Local not specified - PPO | Admitting: Cardiology

## 2013-11-28 ENCOUNTER — Encounter: Payer: Self-pay | Admitting: Cardiology

## 2013-11-28 VITALS — BP 118/64 | HR 76 | Ht 68.0 in | Wt 268.3 lb

## 2013-11-28 DIAGNOSIS — I10 Essential (primary) hypertension: Secondary | ICD-10-CM

## 2013-11-28 DIAGNOSIS — I5022 Chronic systolic (congestive) heart failure: Secondary | ICD-10-CM

## 2013-11-28 DIAGNOSIS — I2510 Atherosclerotic heart disease of native coronary artery without angina pectoris: Secondary | ICD-10-CM | POA: Diagnosis not present

## 2013-11-28 DIAGNOSIS — Z95810 Presence of automatic (implantable) cardiac defibrillator: Secondary | ICD-10-CM

## 2013-11-28 DIAGNOSIS — I2583 Coronary atherosclerosis due to lipid rich plaque: Secondary | ICD-10-CM

## 2013-11-28 DIAGNOSIS — I255 Ischemic cardiomyopathy: Secondary | ICD-10-CM

## 2013-11-28 NOTE — Assessment & Plan Note (Signed)
Continue ARB and beta blocker. 

## 2013-11-28 NOTE — Assessment & Plan Note (Signed)
Continue statin. 

## 2013-11-28 NOTE — Assessment & Plan Note (Signed)
Blood pressure controlled. Continue present medications. 

## 2013-11-28 NOTE — Assessment & Plan Note (Signed)
Follow-up electrophysiology. 

## 2013-11-28 NOTE — Assessment & Plan Note (Signed)
Continue aspirin and statin. 

## 2013-11-28 NOTE — Assessment & Plan Note (Signed)
Euvolemic on examination. Continue present dose of diuretics. 

## 2013-11-28 NOTE — Patient Instructions (Signed)
Your physician wants you to follow-up in: 6 MONTHS WITH DR CRENSHAW You will receive a reminder letter in the mail two months in advance. If you don't receive a letter, please call our office to schedule the follow-up appointment.  

## 2013-12-08 ENCOUNTER — Encounter: Payer: Self-pay | Admitting: Internal Medicine

## 2013-12-10 ENCOUNTER — Encounter: Payer: Self-pay | Admitting: Internal Medicine

## 2013-12-11 ENCOUNTER — Other Ambulatory Visit: Payer: Self-pay | Admitting: Internal Medicine

## 2013-12-18 ENCOUNTER — Other Ambulatory Visit: Payer: Self-pay | Admitting: Neurology

## 2013-12-18 ENCOUNTER — Encounter (HOSPITAL_COMMUNITY): Payer: Self-pay | Admitting: Cardiovascular Disease

## 2013-12-18 NOTE — Telephone Encounter (Signed)
Clonazepam refill requested. Per last office note- patient to remain on medication. Refill approved and sent to patient's pharmacy.   

## 2013-12-23 ENCOUNTER — Other Ambulatory Visit: Payer: Self-pay | Admitting: Internal Medicine

## 2013-12-24 ENCOUNTER — Other Ambulatory Visit: Payer: Self-pay | Admitting: Family Medicine

## 2013-12-25 ENCOUNTER — Other Ambulatory Visit: Payer: Self-pay | Admitting: Family Medicine

## 2013-12-26 ENCOUNTER — Other Ambulatory Visit: Payer: Self-pay | Admitting: Internal Medicine

## 2013-12-26 NOTE — Telephone Encounter (Signed)
OK X1 

## 2013-12-26 NOTE — Telephone Encounter (Signed)
[facility] has been [facility] to [id number]

## 2014-01-03 ENCOUNTER — Other Ambulatory Visit: Payer: Self-pay | Admitting: Neurology

## 2014-01-03 ENCOUNTER — Other Ambulatory Visit: Payer: Self-pay | Admitting: Internal Medicine

## 2014-01-05 ENCOUNTER — Other Ambulatory Visit: Payer: Self-pay

## 2014-01-05 MED ORDER — FLUTICASONE-SALMETEROL 250-50 MCG/DOSE IN AEPB: 1.0000 | INHALATION_SPRAY | Freq: Two times a day (BID) | RESPIRATORY_TRACT | Status: DC | PRN

## 2014-01-05 MED ORDER — ALBUTEROL SULFATE HFA 108 (90 BASE) MCG/ACT IN AERS: 2.0000 | INHALATION_SPRAY | Freq: Four times a day (QID) | RESPIRATORY_TRACT | Status: DC | PRN

## 2014-01-13 ENCOUNTER — Other Ambulatory Visit (INDEPENDENT_AMBULATORY_CARE_PROVIDER_SITE_OTHER): Payer: Federal, State, Local not specified - PPO

## 2014-01-13 ENCOUNTER — Ambulatory Visit (INDEPENDENT_AMBULATORY_CARE_PROVIDER_SITE_OTHER): Payer: Federal, State, Local not specified - PPO | Admitting: Internal Medicine

## 2014-01-13 ENCOUNTER — Other Ambulatory Visit: Payer: Federal, State, Local not specified - PPO

## 2014-01-13 ENCOUNTER — Encounter: Payer: Self-pay | Admitting: Internal Medicine

## 2014-01-13 VITALS — BP 122/70 | HR 72 | Temp 98.2°F | Resp 17 | Ht 68.0 in | Wt 271.0 lb

## 2014-01-13 DIAGNOSIS — E1159 Type 2 diabetes mellitus with other circulatory complications: Secondary | ICD-10-CM | POA: Diagnosis not present

## 2014-01-13 DIAGNOSIS — E890 Postprocedural hypothyroidism: Secondary | ICD-10-CM | POA: Diagnosis not present

## 2014-01-13 LAB — HEMOGLOBIN A1C: HEMOGLOBIN A1C: 9.7 % — AB (ref 4.6–6.5)

## 2014-01-13 LAB — MICROALBUMIN / CREATININE URINE RATIO
Creatinine,U: 133.7 mg/dL
Microalb Creat Ratio: 0.7 mg/g (ref 0.0–30.0)
Microalb, Ur: 0.9 mg/dL (ref 0.0–1.9)

## 2014-01-13 LAB — TSH: TSH: 4.6 u[IU]/mL — ABNORMAL HIGH (ref 0.35–4.50)

## 2014-01-13 NOTE — Patient Instructions (Signed)
Your next office appointment will be determined based upon review of your pending labs. Those instructions will be transmitted to you through My Chart . 

## 2014-01-13 NOTE — Assessment & Plan Note (Signed)
A1c , urine microalbumin 

## 2014-01-13 NOTE — Progress Notes (Signed)
Subjective:    Patient ID: Henry [name], male    DOB: [birth year], 64 y.o.   MRN: [id number]  HPI "Sugars very high."  Diabetes status assessment: Fasting or morning glucose range 130->500 Highest glucose 2 hours after any meal <320 No hypoglycemia reported                                                                                                                 No regular exercise described  No specific nutrition/diet followed; excess sweets over holidays Medication compliance is good. No medication adverse effects noted. Eye exam current. No retinopathy Foot care  current  A1c 08/06/13  Was 8.7% (average sugar 231with long-term 74 %  risk )  TSH was 10.27 in 7/15.He is not on Amiodarone.   Review of Systems  No excess thirst ;  excess hunger ; or excess urination reported except excess urination with Lasix                            Some lightheadedness with standing reported No chest pain ; palpitations ; claudication described .Pacer placed in 2015.                                                                                                                             No non healing skin  ulcers or sores of extremities noted. No numbness or tingling or burning in feet described                                                                                                                                             No significant change in weight . No blurred,double, or loss of vision reported  .         Objective:   Physical Exam  Positive or pertinent findings  include: He has masklike facies due to Parkinson's He has a beard and mustache There is an amputation distally of the right index finger The right great toe is deviated laterally. There is a flexion contracture of the second right toe He has intermittent tremor in the left upper extremity.  General appearance :adequately nourished; in no distress. Eyes: No conjunctival inflammation or scleral  icterus is present. Oral exam: Dental hygiene is good. Lips and gums are healthy appearing.There is no oropharyngeal erythema or exudate noted.  Heart:  Normal rate and regular rhythm. S1 and S2 normal without gallop, murmur, click, rub or other extra sounds   Lungs:Chest clear to auscultation; no wheezes, rhonchi,rales ,or rubs present.No increased work of breathing.  Abdomen: Protuberant;bowel sounds normal, soft and non-tender without masses, organomegaly or hernias noted.  No guarding or rebound.  Vascular : all pulses equal ; no bruits present. Skin:Warm & dry.  Intact without suspicious lesions or rashes ; no jaundice or tenting.Nails healthy Lymphatic: No lymphadenopathy is noted about the head, neck, axilla.  Neuro: light touch intact over feet         Assessment & Plan:  See Current Assessment & Plan in Problem List under specific Diagnosis

## 2014-01-13 NOTE — Assessment & Plan Note (Signed)
TSH 

## 2014-01-13 NOTE — Progress Notes (Signed)
Pre visit review using our clinic review tool, if applicable. No additional management support is needed unless otherwise documented below in the visit note. 

## 2014-01-14 ENCOUNTER — Other Ambulatory Visit: Payer: Self-pay | Admitting: Internal Medicine

## 2014-01-14 DIAGNOSIS — E890 Postprocedural hypothyroidism: Secondary | ICD-10-CM

## 2014-01-14 DIAGNOSIS — E1165 Type 2 diabetes mellitus with hyperglycemia: Secondary | ICD-10-CM

## 2014-01-14 DIAGNOSIS — I2510 Atherosclerotic heart disease of native coronary artery without angina pectoris: Secondary | ICD-10-CM

## 2014-01-14 DIAGNOSIS — E1159 Type 2 diabetes mellitus with other circulatory complications: Secondary | ICD-10-CM

## 2014-01-14 DIAGNOSIS — I5043 Acute on chronic combined systolic (congestive) and diastolic (congestive) heart failure: Secondary | ICD-10-CM

## 2014-01-15 ENCOUNTER — Encounter: Payer: Self-pay | Admitting: Internal Medicine

## 2014-01-22 ENCOUNTER — Other Ambulatory Visit: Payer: Self-pay | Admitting: Internal Medicine

## 2014-01-22 NOTE — Telephone Encounter (Signed)
OK X1 

## 2014-01-22 NOTE — Telephone Encounter (Signed)
Tramadol script has been [facility] to [facility] [phone]

## 2014-02-02 ENCOUNTER — Encounter: Payer: Self-pay | Admitting: Endocrinology

## 2014-02-02 ENCOUNTER — Ambulatory Visit (INDEPENDENT_AMBULATORY_CARE_PROVIDER_SITE_OTHER): Payer: Federal, State, Local not specified - PPO | Admitting: Endocrinology

## 2014-02-02 ENCOUNTER — Other Ambulatory Visit: Payer: Self-pay | Admitting: *Deleted

## 2014-02-02 VITALS — BP 130/68 | HR 77 | Temp 98.2°F | Resp 16 | Ht 68.0 in | Wt 270.2 lb

## 2014-02-02 DIAGNOSIS — E890 Postprocedural hypothyroidism: Secondary | ICD-10-CM | POA: Diagnosis not present

## 2014-02-02 DIAGNOSIS — E1165 Type 2 diabetes mellitus with hyperglycemia: Secondary | ICD-10-CM | POA: Diagnosis not present

## 2014-02-02 DIAGNOSIS — E782 Mixed hyperlipidemia: Secondary | ICD-10-CM | POA: Diagnosis not present

## 2014-02-02 DIAGNOSIS — E669 Obesity, unspecified: Secondary | ICD-10-CM | POA: Diagnosis not present

## 2014-02-02 DIAGNOSIS — I2510 Atherosclerotic heart disease of native coronary artery without angina pectoris: Secondary | ICD-10-CM

## 2014-02-02 DIAGNOSIS — IMO0002 Reserved for concepts with insufficient information to code with codable children: Secondary | ICD-10-CM

## 2014-02-02 MED ORDER — LIRAGLUTIDE 18 MG/3ML ~~LOC~~ SOPN: PEN_INJECTOR | SUBCUTANEOUS | Status: DC

## 2014-02-02 MED ORDER — INSULIN PEN NEEDLE 32G X 5 MM MISC: Status: DC

## 2014-02-02 NOTE — Patient Instructions (Addendum)
Start VICTOZA injection as shown once daily at the same time of the day.  Dial the dose to 0.6 mg on the pen for the first week.  You may inject in the stomach, outer thigh or upper outer arm.  You may experience nausea in the first few days which usually goes away.   You will feel fullness of the stomach with starting the medication and should try to keep the portions at meals small. After 1 week increase the dose to 1.2mg  daily if no nausea present.   If any questions or concerns are present call the office or the [place] helpline at [phone]. Visit [email or web address].info/ for more useful information  Continue Janumet and glimepiride for now.  If blood sugar starts going below 100 will need to reduce glimepiride  Start chair exercises as given  Please check blood sugars at least half the time about 2 hours after any meal and 3 times per week on waking up. Please bring blood sugar monitor to each visit. Recommended blood sugar levels about 2 hours after meal is 140-180 and on waking up 90-130

## 2014-02-02 NOTE — Progress Notes (Signed)
Patient ID: Henry [name], male   DOB: [birth year], 64 y.o.   MRN: [id number]           Reason for Appointment: Consultation for Type 2 Diabetes  Referring physician: Linna Darner  History of Present Illness:          Diagnosis: Type 2 diabetes mellitus, date of diagnosis: 2000      Past history:   Patient thinks he has been taking Amaryl for several years and probably metformin since onset also At some point he was changed from metformin to [facility] and has been taking this since at least 2012 His A1c in the past has generally been around 7.5 However it has been higher in 2015, he has continued the same medication regimen  Recent history:  Since his A1c has been progressively higher early 2015 he is now referred here for further management. Patient also thinks his blood sugars have been higher over the last year although he is checking blood sugars mostly in the morning He has had difficulty losing weight poorly because of his inability to exercise. Also his wife thinks that he is constantly hungry and does not control portions are types of foods that he is eating especially with eating with the family. He did not bring his blood sugars for review but usually has readings over 200 and the morning He has been fairly compliant with his regimen of Janumet and Amaryl and has no side effects. He also has had minimal diabetes education especially recently       Oral hypoglycemic drugs the patient is taking are: Janumet 50/1000 twice a day, Amaryl 2 mg a.m. and 1 mg p.m.   Side effects from medications have been: None Compliance with the medical regimen: Fair  Hypoglycemia:   none  Glucose monitoring:  done one time a day         Glucometer: One Touch.      Blood Glucose readings by recall  PREMEAL Breakfast Lunch Dinner Bedtime  Overall   Glucose range: 180-225   225    Self-care: The diet that the patient has been following is: None, tries to eat more vegetables .     Meals: 3 meals per  day. Breakfast is gravy biscuit or eggs and grits.            Exercise: unable to do any         Dietician visit, most recent: 2000               Weight history: 266-277  Wt Readings from Last 3 Encounters:  02/02/14 270 lb 3.2 oz (122.562 kg)  01/13/14 271 lb (122.925 kg)  11/28/13 268 lb 4.8 oz (121.7 kg)    Glycemic control:   Lab Results  Component Value Date   HGBA1C 9.7* 01/13/2014   HGBA1C 8.7* 08/06/2013   HGBA1C 8.3* 04/08/2013   Lab Results  Component Value Date   MICROALBUR 0.9 01/13/2014   LDLCALC 126* 04/08/2013   CREATININE 1.1 11/10/2013         Medication List       This list is accurate as of: 02/02/14  3:04 PM.  Always use your most recent med list.               acetaminophen 325 MG tablet  Commonly known as:  TYLENOL  Take 1-2 tablets (325-650 mg total) by mouth every 4 (four) hours as needed for mild pain.     albuterol 108 (90 BASE) MCG/ACT  inhaler  Commonly known as:  PROVENTIL HFA;VENTOLIN HFA  Inhale 2 puffs into the lungs every 6 (six) hours as needed for wheezing or shortness of breath.     aspirin EC 81 MG tablet  Take 81 mg by mouth daily.     atorvastatin 40 MG tablet  Commonly known as:  LIPITOR  Take 1 tablet (40 mg total) by mouth daily.     carbidopa-levodopa 25-100 MG per tablet  Commonly known as:  SINEMET IR  Take 1 tablet by mouth 3 (three) times daily.     clonazePAM 0.5 MG tablet  Commonly known as:  KLONOPIN  Take 1.5 tablets (0.75 mg total) by mouth at bedtime.     Fluticasone-Salmeterol 250-50 MCG/DOSE Aepb  Commonly known as:  ADVAIR  Inhale 1 puff into the lungs 2 (two) times daily as needed (for copd symptoms).     furosemide 80 MG tablet  Commonly known as:  LASIX  Take 120 mg by mouth daily.     glimepiride 2 MG tablet  Commonly known as:  AMARYL  TAKE 1 TABLET BY MOUTH IN THE MORNING AND 1/2 TABLET AT NIGHT     ibuprofen 200 MG tablet  Commonly known as:  ADVIL,MOTRIN  Take 800 mg by mouth  daily as needed for pain.     Insulin Pen Needle 32G X 5 MM Misc  Commonly known as:  NOVOTWIST  Use one per day to inject Victoza     levothyroxine 125 MCG tablet  Commonly known as:  SYNTHROID, LEVOTHROID  1 qd EXCEPT 1&1/2 on Weds     Liraglutide 18 MG/3ML Sopn  Commonly known as:  VICTOZA  Inject 1.2 mg once a day     losartan 50 MG tablet  Commonly known as:  COZAAR  Take 50 mg by mouth at bedtime.     metoprolol succinate 50 MG 24 hr tablet  Commonly known as:  TOPROL-XL  Take 1 tablet (50 mg total) by mouth at bedtime. Take with or immediately following a meal.     multivitamin with minerals Tabs tablet  Take 1 tablet by mouth daily. Antioxidant  Multi vitamin     nitroGLYCERIN 0.4 MG SL tablet  Commonly known as:  NITROSTAT  Place 1 tablet (0.4 mg total) under the tongue every 5 (five) minutes as needed for chest pain.     ONE TOUCH ULTRA TEST test strip  Generic drug:  glucose blood  CHECK BLOOD SUGAR ONCE A DAY     ONETOUCH DELICA LANCETS FINE Misc  Check Blood Sugar Daily     potassium chloride 10 MEQ tablet  Commonly known as:  K-DUR,KLOR-CON  Take 30 mEq by mouth daily.     KLOR-CON M10 10 MEQ tablet  Generic drug:  potassium chloride  TAKE 3 TABLETS BY MOUTH DAILY     pramipexole 0.5 MG tablet  Commonly known as:  MIRAPEX  Take 0.5 mg by mouth 2 (two) times daily.     sitaGLIPtin-metformin 50-1000 MG per tablet  Commonly known as:  JANUMET  Take 1 tablet by mouth 2 (two) times daily with a meal.     spironolactone 25 MG tablet  Commonly known as:  ALDACTONE  Take 1 tablet (25 mg total) by mouth daily.     traMADol 50 MG tablet  Commonly known as:  ULTRAM  TAKE 1 TABLET TWICE A DAY ONLY AS NEEDED     travoprost (benzalkonium) 0.004 % ophthalmic solution  Commonly known as:  TRAVATAN  Place 1 drop into the right eye at bedtime.     trihexyphenidyl 2 MG tablet  Commonly known as:  ARTANE  Take 2 mg by mouth 2 (two) times daily with a meal.       vitamin B-12 1000 MCG tablet  Commonly known as:  CYANOCOBALAMIN  Take 1,000 mcg by mouth daily.        Allergies:  Allergies  Allergen Reactions  . Penicillins Rash    Because of a history of documented adverse serious drug reaction;Medi Alert bracelet  is recommended    Past Medical History  Diagnosis Date  . Cardiomyopathy   . CAD (coronary artery disease)   . HTN (hypertension)     pt denies 08/19/12  . MI (myocardial infarction)   . Sleep apnea   . GERD (gastroesophageal reflux disease)   . Hyperthyroidism   . Hyperplasia, prostate   . Benign neoplasm of colon   . Nephrolithiasis   . Ventral hernia   . HLD (hyperlipidemia)   . Parkinson disease     1999  . OSA (obstructive sleep apnea)     AHI-28,on CPAP, noncompliant with CPAP  . Sleep apnea, organic   . Complication of anesthesia     pt states that he got a rash  . Arthritis   . Shortness of breath     Hx: of at all times  . UTI (lower urinary tract infection) 09/15/12    Klebsiella  . Asthma   . DM (diabetes mellitus)     TYPE 2     Past Surgical History  Procedure Laterality Date  . Coronary artery bypass graft  2000    [name], MD  . Coronary stent placement  1998  . Median sternotomy  2000  . Deep brain stimulator placement  2004    Right and left VIN stimulator placement   . Acoustic neuroma resection  1981  . Finger amputation      left pointer  . Cataract extraction w/ intraocular lens implant      Hx: of right eye  . Tonsillectomy    . Colonoscopy w/ biopsies and polypectomy      Hx: of  . Cardiac catheterization    . Subthalamic stimulator battery replacement N/A 09/05/2012    Procedure: Deep brain stimulator battery change;  Surgeon: Erline Levine, MD;  Location: [facility];  Service: Neurosurgery;  Laterality: N/A;  Deep brain stimulator battery change  . Left and right heart catheterization with coronary angiogram N/A 09/24/2013    Procedure: LEFT AND RIGHT HEART  CATHETERIZATION WITH CORONARY ANGIOGRAM;  Surgeon: Burnell Blanks, MD;  Location: [facility] CATH LAB;  Service: Cardiovascular;  Laterality: N/A;  . Implantable cardioverter defibrillator implant N/A 11/13/2013    Procedure: IMPLANTABLE CARDIOVERTER DEFIBRILLATOR IMPLANT;  Surgeon: Evans Lance, MD;  Location: [facility] CATH LAB;  Service: Cardiovascular;  Laterality: N/A;    Family History  Problem Relation Age of Onset  . Peripheral vascular disease    . Arthritis    . Alcoholism Mother 55    deceased   . Aneurysm Father 60  . Autoimmune disease Brother 58    AIDS  . Diabetes Neg Hx   . Heart disease Neg Hx     Social History:  reports that he has never smoked. He has never used smokeless tobacco. He reports that he drinks alcohol. He reports that he does not use illicit drugs.    Review of Systems  Vision is normal. Most recent eye exam was 8/15       Lipids: On Lipitor, previously was on Crestor unchanged because of cost      Lab Results  Component Value Date   CHOL 151 08/06/2013   HDL 37.20* 08/06/2013   LDLCALC 126* 04/08/2013   LDLDIRECT 88.7 08/06/2013   TRIG 262.0* 08/06/2013   CHOLHDL 4 08/06/2013                  Skin: No rash or infections     Thyroid:  No  unusual fatigue.  He has had hypothyroidism following radioactive iodine treatment for hyperthyroidism in 2013  TSH relatively high and his dose has been adjusted by PCP  Lab Results  Component Value Date   TSH 4.60* 01/13/2014   TSH 10.27* 08/06/2013   TSH 4.29 04/08/2013   FREET4 0.98 11/20/2011   FREET4 0.91 10/16/2011   FREET4 0.29* 09/13/2011       The blood pressure has been only mildly increased but he is taking losartan for this      No swelling of feet.     He may get shortness of breath on exertion. ?  Has COPD   No recent problems with chest tightness or discomfort.  No recent history of CHF but he reportedly has low ejection fraction      Bowel habits: Normal.No nausea.      No  joint  Pains. Backtends to hurt periodically         He has occasional  history of Numbness, tingling or burning in feet       Gait imbalance from Parkinsons, Long-standing and treated with brain stimulator  and followed by neurologist    No recent bladder function difficulties or infections.  Has had history of BPH   LABS:  No visits with results within 1 Week(s) from this visit. Latest known visit with results is:  Appointment on 01/13/2014  Component Date Value Ref Range Status  . Hgb A1c MFr Bld 01/13/2014 9.7* 4.6 - 6.5 % Final   Glycemic Control Guidelines for People with Diabetes:Non Diabetic:  <6%Goal of Therapy: <7%Additional Action Suggested:  >8%   . Microalb, Ur 01/13/2014 0.9  0.0 - 1.9 mg/dL Final  . Creatinine,U 01/13/2014 133.7   Final  . Microalb Creat Ratio 01/13/2014 0.7  0.0 - 30.0 mg/g Final  . TSH 01/13/2014 4.60* 0.35 - 4.50 uIU/mL Final    Physical Examination:  BP 130/68 mmHg  Pulse 77  Temp(Src) 98.2 F (36.8 C)  Resp 16  Ht 5\' 8"  (1.727 m)  Wt 270 lb 3.2 oz (122.562 kg)  BMI 41.09 kg/m2  SpO2 96%  GENERAL:         Patient has generalized obesity.   HEENT:         Eye exam shows normal external appearance. Fundus exam shows no retinopathy. Oral exam shows normal mucosa .  NECK:         General:  Neck exam shows no lymphadenopathy. Carotids are normal to palpation and no bruit heard.  Thyroid is not enlarged and no nodules felt.   LUNGS:         Chest is symmetrical. Lungs are clear to auscultation.Marland Kitchen   HEART:         Heart sounds:  S1 and S2 are normal. No murmurs or clicks heard., no S3 or S4.   ABDOMEN:   significant obesity present.  Liver and spleen are not palpable. No other  mass or tenderness present.  EXTREMITIES:     There is no edema. No skin lesions present.Marland Kitchen  NEUROLOGICAL:   Vibration sense is mildly reduced in toes slightly better on the right. Ankle jerks are absent bilaterally.   Diabetic foot exam shows normal monofilament sensation  in the toes except decreased on the right great toe and plantar surfaces, no skin lesions or ulcers on the feet and normal pedal pulses MUSCULOSKELETAL:       There is no enlargement or deformity of the joints. Spine is normal to inspection.Marland Kitchen   SKIN:       No rash or lesions of concern.        ASSESSMENT:  Diabetes type 2, uncontrolled with obesity    Patient has had long-standing diabetes showing significant progression in the last year or so especially recently with A1c 9.7%. Also has not had A1c below target of 7% for the last few years. He has morbid obesity and has difficulty losing weight He has significant difficulty following his diet also both with types of food and portions and snacks. Also has significant difficulty trying to ambulate and exercise because of his Parkinson's disease Currently monitoring blood sugars mostly fasting and has not done any postprandial readings.  He will definitely benefit from trying a GLP-1 drugs which would improve his control significantly as well as help him with dietary compliance. Most likely can stop the Januvia once he is established on this He is interested in seeing the nutritionist and will set this up.  Complications: Minimal neuropathy  Has had significant cardiac problems including coronary artery disease, cardiomyopathy and needing pacemaker  Hypothyroidism: TSH relatively high and his dose has been adjusted by PCP  PLAN:  Discussed with the patient the nature of GLP-1 drugs, the actions on various organ systems, how they benefit blood glucose control, as well as the benefit of weight loss and  increase satiety . Explained possible side effects especially nausea and vomiting initially; discussed safety information in package insert.  Described the injection technique to him and his wife and dosage titration of Victoza  starting with 0.6 mg once a day at the same time for the first week and then increasing to 1.2 mg if no symptoms of  nausea.  Educational brochure on Victoza and co-pay card given.  His wife thinks that she can help him do the injections without difficulty but will call if she needs to see the nurse educator for this Start monitoring postprandial readings at least half the time and bring monitor for review on each visit. Consultation with dietitian Chair exercises Follow-up in 2 weeks for evaluation of progress and further adjustment in treatment regimen including trial of drugs like Invokana  Patient Instructions  Start VICTOZA injection as shown once daily at the same time of the day.  Dial the dose to 0.6 mg on the pen for the first week.  You may inject in the stomach, outer thigh or upper outer arm.  You may experience nausea in the first few days which usually goes away.   You will feel fullness of the stomach with starting the medication and should try to keep the portions at meals small. After 1 week increase the dose to 1.2mg  daily if no nausea present.   If any questions or concerns are present call the office or the [place] helpline at [phone]. Visit [email or web address].info/ for more useful information  Continue Janumet and glimepiride for now.  If blood sugar starts going below  100 will need to reduce glimepiride  Start chair exercises as given  Please check blood sugars at least half the time about 2 hours after any meal and 3 times per week on waking up. Please bring blood sugar monitor to each visit. Recommended blood sugar levels about 2 hours after meal is 140-180 and on waking up 90-130     Counseling time over 50% of today's 60 minute visit  Branden Vine 02/02/2014, 3:04 PM   Note: This office note was prepared with Estate agent. Any transcriptional errors that result from this process are unintentional.

## 2014-02-10 ENCOUNTER — Other Ambulatory Visit: Payer: Self-pay | Admitting: Internal Medicine

## 2014-02-14 ENCOUNTER — Other Ambulatory Visit: Payer: Self-pay | Admitting: Neurology

## 2014-02-16 NOTE — Telephone Encounter (Signed)
Carbidopa Levodopa refill requested. Per last office note- patient to remain on medication. Refill approved and sent to patient's pharmacy.   

## 2014-02-17 ENCOUNTER — Encounter: Payer: Self-pay | Admitting: Endocrinology

## 2014-02-17 ENCOUNTER — Encounter: Payer: Federal, State, Local not specified - PPO | Admitting: Internal Medicine

## 2014-02-17 ENCOUNTER — Other Ambulatory Visit: Payer: Self-pay | Admitting: Internal Medicine

## 2014-02-17 ENCOUNTER — Other Ambulatory Visit: Payer: Self-pay | Admitting: *Deleted

## 2014-02-17 ENCOUNTER — Ambulatory Visit (INDEPENDENT_AMBULATORY_CARE_PROVIDER_SITE_OTHER): Payer: Federal, State, Local not specified - PPO | Admitting: Endocrinology

## 2014-02-17 VITALS — BP 120/69 | HR 84 | Temp 98.2°F | Resp 16 | Ht 68.0 in | Wt 269.0 lb

## 2014-02-17 DIAGNOSIS — I2510 Atherosclerotic heart disease of native coronary artery without angina pectoris: Secondary | ICD-10-CM

## 2014-02-17 DIAGNOSIS — E1165 Type 2 diabetes mellitus with hyperglycemia: Secondary | ICD-10-CM

## 2014-02-17 DIAGNOSIS — IMO0002 Reserved for concepts with insufficient information to code with codable children: Secondary | ICD-10-CM

## 2014-02-17 MED ORDER — GLUCOSE BLOOD VI STRP: ORAL_STRIP | Status: DC

## 2014-02-17 MED ORDER — METFORMIN HCL 1000 MG PO TABS: 1000.0000 mg | ORAL_TABLET | Freq: Two times a day (BID) | ORAL | Status: DC

## 2014-02-17 NOTE — Telephone Encounter (Signed)
OK X1 

## 2014-02-17 NOTE — Telephone Encounter (Signed)
Tramadol has been called to [facility] 

## 2014-02-17 NOTE — Progress Notes (Signed)
Patient ID: Henry [name], male   DOB: 04/28/1950, 64 y.o.   MRN: [id number]           Reason for Appointment: Follow-up for Type 2 Diabetes  Referring physician: Linna Darner  History of Present Illness:          Diagnosis: Type 2 diabetes mellitus, date of diagnosis: 2000      Past history:   Patient thinks he has been taking Amaryl for several years and probably metformin since onset also At some point he was changed from metformin to [facility] and has been taking this since at least 2012 His A1c in the past has generally been around 7.5 However it has been higher in 2015, he has continued the same medication regimen  Recent history:  Since his A1c had been progressively higher with his regimen of Janumet and Amaryl he was started on Victoza in 01/2014 He has been taking this for about 2 weeks.  However he did not understand instructions and is still taking 0.6 mg. He thinks he has had some nausea with this but relatively less and not much in the last couple of days His blood sugars have been gradually improving and significantly better in the last week or so. However his fasting blood sugars appear to be still high; they have been as high as 307 previously Postprandial readings: He has not done any in the last week but they seem to be improving with the lowest reading 136 He says that he subjectively feels better also Patient previously had difficulty controlling his hunger and he is doing better with this, has had lost any weight as yet He has been fairly compliant with his regimen of Janumet and Amaryl and has no side effects. He has not been scheduled to see the dietitian as yet.  Previously not really watching his diet       Oral hypoglycemic drugs the patient is taking are: Janumet 50/1000 twice a day, Amaryl 2 mg a.m. and 1 mg p.m.   Side effects from medications have been: None Compliance with the medical regimen: Fair  Hypoglycemia:   none  Glucose monitoring:  done one time  a day         Glucometer: One Touch.      Blood Glucose readings by download recently; overall average is for the last 4 weeks  PRE-MEAL Breakfast Lunch  2 PM  Bedtime Overall  Glucose range:  162-259   127-180   143-202   136-196    Mean/median:      200    Self-care: The diet that the patient has been following is: None, tries to eat more vegetables .     Meals: 3 meals per day. Breakfast is gravy biscuit or eggs and grits.            Exercise: unable to do any         Dietician visit, most recent: 2000               Weight history: 266-277  Wt Readings from Last 3 Encounters:  02/17/14 269 lb (122.018 kg)  02/02/14 270 lb 3.2 oz (122.562 kg)  01/13/14 271 lb (122.925 kg)    Glycemic control:   Lab Results  Component Value Date   HGBA1C 9.7* 01/13/2014   HGBA1C 8.7* 08/06/2013   HGBA1C 8.3* 04/08/2013   Lab Results  Component Value Date   MICROALBUR 0.9 01/13/2014   LDLCALC 126* 04/08/2013   CREATININE 1.1 11/10/2013  Medication List       This list is accurate as of: 02/17/14  9:33 PM.  Always use your most recent med list.               acetaminophen 325 MG tablet  Commonly known as:  TYLENOL  Take 1-2 tablets (325-650 mg total) by mouth every 4 (four) hours as needed for mild pain.     albuterol 108 (90 BASE) MCG/ACT inhaler  Commonly known as:  PROVENTIL HFA;VENTOLIN HFA  Inhale 2 puffs into the lungs every 6 (six) hours as needed for wheezing or shortness of breath.     aspirin EC 81 MG tablet  Take 81 mg by mouth daily.     atorvastatin 40 MG tablet  Commonly known as:  LIPITOR  Take 1 tablet (40 mg total) by mouth daily.     carbidopa-levodopa 25-100 MG per tablet  Commonly known as:  SINEMET IR  TAKE 1 TABLET BY MOUTH 3 (THREE) TIMES DAILY.     clonazePAM 0.5 MG tablet  Commonly known as:  KLONOPIN  Take 1.5 tablets (0.75 mg total) by mouth at bedtime.     Fluticasone-Salmeterol 250-50 MCG/DOSE Aepb  Commonly known as:  ADVAIR    Inhale 1 puff into the lungs 2 (two) times daily as needed (for copd symptoms).     furosemide 80 MG tablet  Commonly known as:  LASIX  Take 120 mg by mouth daily.     glimepiride 2 MG tablet  Commonly known as:  AMARYL  TAKE 1 TABLET BY MOUTH IN THE MORNING AND 1/2 TABLET AT NIGHT     glucose blood test strip  Commonly known as:  ONE TOUCH ULTRA TEST  Use as instructed to check blood sugar 2 times per day dx code E11.59     ibuprofen 200 MG tablet  Commonly known as:  ADVIL,MOTRIN  Take 800 mg by mouth daily as needed for pain.     Insulin Pen Needle 32G X 5 MM Misc  Commonly known as:  NOVOTWIST  Use one per day to inject Victoza     levothyroxine 125 MCG tablet  Commonly known as:  SYNTHROID, LEVOTHROID  1 qd EXCEPT 1&1/2 on Weds     Liraglutide 18 MG/3ML Sopn  Commonly known as:  VICTOZA  Inject 1.2 mg once a day     losartan 50 MG tablet  Commonly known as:  COZAAR  Take 50 mg by mouth at bedtime.     metFORMIN 1000 MG tablet  Commonly known as:  GLUCOPHAGE  Take 1 tablet (1,000 mg total) by mouth 2 (two) times daily with a meal.     metoprolol succinate 50 MG 24 hr tablet  Commonly known as:  TOPROL-XL  Take 1 tablet (50 mg total) by mouth at bedtime. Take with or immediately following a meal.     multivitamin with minerals Tabs tablet  Take 1 tablet by mouth daily. Antioxidant  Multi vitamin     nitroGLYCERIN 0.4 MG SL tablet  Commonly known as:  NITROSTAT  Place 1 tablet (0.4 mg total) under the tongue every 5 (five) minutes as needed for chest pain.     ONETOUCH DELICA LANCETS FINE Misc  Check Blood Sugar Daily     potassium chloride 10 MEQ tablet  Commonly known as:  K-DUR,KLOR-CON  Take 30 mEq by mouth daily.     KLOR-CON M10 10 MEQ tablet  Generic drug:  potassium chloride  TAKE 3 TABLETS BY  MOUTH DAILY     pramipexole 0.5 MG tablet  Commonly known as:  MIRAPEX  Take 0.5 mg by mouth 2 (two) times daily.     spironolactone 25 MG tablet   Commonly known as:  ALDACTONE  Take 1 tablet (25 mg total) by mouth daily.     traMADol 50 MG tablet  Commonly known as:  ULTRAM  TAKE 1 TABLET TWICE A DAY AS NEED     travoprost (benzalkonium) 0.004 % ophthalmic solution  Commonly known as:  TRAVATAN  Place 1 drop into the right eye at bedtime.     trihexyphenidyl 2 MG tablet  Commonly known as:  ARTANE  Take 2 mg by mouth 2 (two) times daily with a meal.     vitamin B-12 1000 MCG tablet  Commonly known as:  CYANOCOBALAMIN  Take 1,000 mcg by mouth daily.        Allergies:  Allergies  Allergen Reactions  . Penicillins Rash    Because of a history of documented adverse serious drug reaction;Medi Alert bracelet  is recommended    Past Medical History  Diagnosis Date  . Cardiomyopathy   . CAD (coronary artery disease)   . HTN (hypertension)     pt denies 08/19/12  . MI (myocardial infarction)   . Sleep apnea   . GERD (gastroesophageal reflux disease)   . Hyperthyroidism   . Hyperplasia, prostate   . Benign neoplasm of colon   . Nephrolithiasis   . Ventral hernia   . HLD (hyperlipidemia)   . Parkinson disease     1999  . OSA (obstructive sleep apnea)     AHI-28,on CPAP, noncompliant with CPAP  . Sleep apnea, organic   . Complication of anesthesia     pt states that he got a rash  . Arthritis   . Shortness of breath     Hx: of at all times  . UTI (lower urinary tract infection) 09/15/12    Klebsiella  . Asthma   . DM (diabetes mellitus)     TYPE 2     Past Surgical History  Procedure Laterality Date  . Coronary artery bypass graft  2000    [name], MD  . Coronary stent placement  1998  . Median sternotomy  2000  . Deep brain stimulator placement  2004    Right and left VIN stimulator placement   . Acoustic neuroma resection  1981  . Finger amputation      left pointer  . Cataract extraction w/ intraocular lens implant      Hx: of right eye  . Tonsillectomy    . Colonoscopy w/ biopsies and  polypectomy      Hx: of  . Cardiac catheterization    . Subthalamic stimulator battery replacement N/A 09/05/2012    Procedure: Deep brain stimulator battery change;  Surgeon: Erline Levine, MD;  Location: [place] [facility] OR[facility];  Service: Neurosurgery;  Laterality: N/A;  Deep brain stimulator battery change  . Left and right heart catheterization with coronary angiogram N/A 09/24/2013    Procedure: LEFT AND RIGHT HEART CATHETERIZATION WITH CORONARY ANGIOGRAM;  Surgeon: Burnell Blanks, MD;  Location: [facility] CATH LAB;  Service: Cardiovascular;  Laterality: N/A;  . Implantable cardioverter defibrillator implant N/A 11/13/2013    Procedure: IMPLANTABLE CARDIOVERTER DEFIBRILLATOR IMPLANT;  Surgeon: Evans Lance, MD;  Location: [facility] CATH LAB;  Service: Cardiovascular;  Laterality: N/A;    Family History  Problem Relation Age of Onset  . Peripheral vascular disease    .  Arthritis    . Alcoholism Mother 18    deceased   . Aneurysm Father 40  . Autoimmune disease Brother 2    AIDS  . Diabetes Neg Hx   . Heart disease Neg Hx     Social History:  reports that he has never smoked. He has never used smokeless tobacco. He reports that he drinks alcohol. He reports that he does not use illicit drugs.    Review of Systems       Vision is normal. Most recent eye exam was 8/15       Lipids: On Lipitor      Lab Results  Component Value Date   CHOL 151 08/06/2013   HDL 37.20* 08/06/2013   LDLCALC 126* 04/08/2013   LDLDIRECT 88.7 08/06/2013   TRIG 262.0* 08/06/2013   CHOLHDL 4 08/06/2013                  Thyroid:  No  unusual fatigue.  He has had hypothyroidism following radioactive iodine treatment for hyperthyroidism in 2013  TSH relatively high and his dose has been adjusted by PCP  Lab Results  Component Value Date   TSH 4.60* 01/13/2014   TSH 10.27* 08/06/2013   TSH 4.29 04/08/2013   FREET4 0.98 11/20/2011   FREET4 0.91 10/16/2011   FREET4 0.29* 09/13/2011       The blood pressure has  been only mildly increased but he is taking losartan for this   Diabetic foot exam in 1/16 showed shows normal monofilament sensation in the toes except decreased on the right great toe and plantar surfaces, no skin lesions or ulcers on the feet and normal pedal pulses   LABS:  No visits with results within 1 Week(s) from this visit. Latest known visit with results is:  Appointment on 01/13/2014  Component Date Value Ref Range Status  . Hgb A1c MFr Bld 01/13/2014 9.7* 4.6 - 6.5 % Final   Glycemic Control Guidelines for People with Diabetes:Non Diabetic:  <6%Goal of Therapy: <7%Additional Action Suggested:  >8%   . Microalb, Ur 01/13/2014 0.9  0.0 - 1.9 mg/dL Final  . Creatinine,U 01/13/2014 133.7   Final  . Microalb Creat Ratio 01/13/2014 0.7  0.0 - 30.0 mg/g Final  . TSH 01/13/2014 4.60* 0.35 - 4.50 uIU/mL Final    Physical Examination:  BP 120/69 mmHg  Pulse 84  Temp(Src) 98.2 F (36.8 C)  Resp 16  Ht 5\' 8"  (1.727 m)  Wt 269 lb (122.018 kg)  BMI 40.91 kg/m2  SpO2 93%     ASSESSMENT:  Diabetes type 2, uncontrolled with obesity    He appears to be benefiting from [place] and even though he is only taking 0.6 mg his blood sugars are significantly better especially in the last few days. However tends to be having higher fasting blood sugars compared to later in the day Currently not monitoring postprandial readings consistently but is trying to do more than he used to Since he does not have any significant nausea at this time may be able to tolerate higher doses of Victoza and get further benefit   PLAN:  He will increase Victoza to 1.2 mg.  If having nausea he can take 5 clicks beyond the 0.6 mg dose. Will also switch his Janumet to metformin 1 g twice a day He will take a larger dose of Amaryl in the evening and smaller in the morning Consultation with the dietitian to be scheduled   Patient Instructions  Amaryl  1/2 in a.m. and 1in p.m.    Victoza 1.2mg  daily but if  nauseated a lot dial up 5 clicks beyond 0.6    Maree Ainley 02/17/2014, 9:33 PM   Note: This office note was prepared with Dragon voice recognition system technology. Any transcriptional errors that result from this process are unintentional.

## 2014-02-17 NOTE — Patient Instructions (Signed)
Amaryl 1/2 in a.m. and 1in p.m.    Victoza 1.2mg  daily but if nauseated a lot dial up 5 clicks beyond 0.6

## 2014-02-21 ENCOUNTER — Other Ambulatory Visit: Payer: Self-pay | Admitting: Internal Medicine

## 2014-02-23 ENCOUNTER — Ambulatory Visit (INDEPENDENT_AMBULATORY_CARE_PROVIDER_SITE_OTHER): Payer: Federal, State, Local not specified - PPO | Admitting: Neurology

## 2014-02-23 ENCOUNTER — Encounter: Payer: Self-pay | Admitting: Neurology

## 2014-02-23 VITALS — BP 126/62 | HR 80 | Ht 68.5 in | Wt 271.0 lb

## 2014-02-23 DIAGNOSIS — G4752 REM sleep behavior disorder: Secondary | ICD-10-CM

## 2014-02-23 DIAGNOSIS — G20 Parkinson's disease: Secondary | ICD-10-CM

## 2014-02-23 DIAGNOSIS — E1165 Type 2 diabetes mellitus with hyperglycemia: Secondary | ICD-10-CM

## 2014-02-23 DIAGNOSIS — IMO0002 Reserved for concepts with insufficient information to code with codable children: Secondary | ICD-10-CM

## 2014-02-23 DIAGNOSIS — G4733 Obstructive sleep apnea (adult) (pediatric): Secondary | ICD-10-CM | POA: Diagnosis not present

## 2014-02-23 NOTE — Procedures (Signed)
DBS Programming was performed.    Total time spent programming was 30 minutes.  Device was confirmed to be on.  Soft start was confirmed to be on.  Impedences were checked and were within normal limits.  Battery was checked and was determined to be functioning normally and not near the end of life.  Detailed analysis on separate neurophysiologic worksheet.    Final settings were as follows:  Left brain electrode:     1-2+           ; Amplitude  4.0   V   ; Pulse width 90 microseconds;   Frequency   150   Hz.  Right brain electrode:     4-7+          ; Amplitude   3.9  V ;  Pulse width 90  microseconds;  Frequency   150    Hz.

## 2014-02-23 NOTE — Progress Notes (Signed)
Henry [name] was seen today in the movement disorders clinic for neurologic consultation at the request of [name].  His PCP is [name], MD.  The consultation is for the evaluation of PD and to manage his DBS.  He is accompanied by his wife who supplements the history.  The first symptom(s) the patient noticed was right hand tremor in 1999.  He was seen by neurology and was dx with PD.  He was placed on something, but it caused sleepiness.  He does not think that he has ever been on levodopa.   He was placed on Mirapex, which seemed to help.  He only takes it twice per day.  He didn't think it made a difference when he took it tid.  He began to have tremor and it was suggested he do DBS.    The pt is s/p stn DBS in 2005.  He had a battery change in 2009.    10/02/12 update: The patient is accompanied today by his daughter, who supplements the history.  I reviewed medical records available to me since last visit.  The patient had his generator changed on 09/05/2012.  He remains on pramipexole, 0.5 mg twice per day.  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 [name] in that regard.  He is not compliant with CPAP so says that he doesn't want to return to her for f/u. He doesn't take the klonopin faithfully.  10/11/12 update:  Pt was seen as a walk in/work in today.  Pt had increasing tremor, L greater than R for 3 days.  Thinks that it started with the d/c of artane.  Speech stable.  11/19/12 update:  Pt is seen today for his PD, accompanied by his daughter who supplements the hx.    He is currently on klonopin 0.5 mg - 1/2-1 tablet q hs.  He only takes it when his wife is not working.  She works nights and is only in 2 days per week.  He has some reluctance to take it other nights.  He is on pramipexole 0.5 mg bid.  Last visit, his DBS was reset more similar to the settings he had prior to coming here, just with an increased voltage.  About 2 weeks after our last  visit, the patient decided to go back on the Artane.  The combination of the Artane and the DBS changes helped significantly.  He does ask if I can slightly increased voltage on the left hand, as he has some tremor at night that is bothersome.  Otherwise, he is doing well.  He feels that his balance has been great.  02/19/13 update:  This patient is accompanied in the office by his child who supplements the history.  The pt has a hx of PD.  He has been tremor free and is very happy about that.  He has had some increased balance loss; the last fall was a few months ago but he hasn't gotten hurt.  Was considering hernia surgery.  The records that were made available to me were reviewed.  He is holding on that for now but plans to have it done before end of year.  No hallucinations.  He is still having some acting out of the dreams, despite clonazepam.  06/19/13 update:  Patient is returning to followup regarding his Parkinson's disease.  I had the opportunity to review records since last visit.  He went to the emergency room on 05/12/2013  with chest pain and shortness of breath.  He had been fishing and had missed several doses of Lasix.  He had been eating seafood.  He ended up with an acute exacerbation of his chronic congestive heart failure.  Once he was diuresed and back on his medications, he is feeling better.  He just had a nuclear medicine study done on 06/17/2013 and the ejection fraction on this looked better than on his echocardiogram.  The left ventricular ejection fraction was 41%.  Prior to that, there was some concern that his left ventricular ejection fraction had dropped so much that he may need an ICD.  In terms of Parkinson's disease, the patient states that he has been doing very well in terms of tremor, but his walking really has deteriorated.  He describes a festinating gait, with much more shuffling.  He fell walking over his dog gate but otherwise has not had falls.  He went fishing  yesterday and states that he "stumbled all day long."  No hallucinations.  He has been trying to be more faithful with his CPAP. He remains on Mirapex 0.5 mg twice a day, Artane 2 mg twice a day.  10/23/13 update:  The patient returns today to the clinic, accompanied by his wife who supplements the history.  From a Parkinson's standpoint, the patient states that he has been doing very well.  No tremor.  He started on levodopa last visit and states that he has had no falls and overall stumbling has been much better.  He states that the only time that he stumbles is if he is inside of his fishing boat, and states that that is really because it is small and unstable.  He is taking his levodopa in the morning, after lunch and bedtime.  He has had no hallucinations.  His wife states that he is still draining and acting out the dreams and even fell out of bed a few days ago.  However, he is taking his clonazepam about 10 PM but doesn't go to bed until 1 AM.  He is not using his CPAP.  Is having significant constipation.   I reviewed his cardiology records since last visit.  He did have a heart catheterization and is scheduled to have an ICD placed on November 5.  02/23/14 update:  Pt returns for follow up accompanied by his wife who supplements the history.  The records that were made available to me were reviewed since last visit.  He had an ICD placed on 11/15/13.  He is doing well.  Taking carbidopa/levodopa 25/100 three times a day and remains on artane as well as mirapex 0.5 mg bid.  Had a fall up the stairs.  Wife describes festinating gate and pt thinks that levodopa contributes.  He also doesn't feel good much of the time and thinks that is from levodopa.  Admits, however, that when BS under good control, he feels good.  Has started seeing endocrinology and started on new meds.  He has klonopin for RBD.  Still vivid dreams but no falling out bed (in recliner now).  Having some word finding trouble and memory  doesn't seem clear.  Also, put back tailgate down on car and came down on generator and wants me to look at that.     Neuroimaging has  previously been performed.  It is available for my review today.  This was done in February, 2005 in preparation for DBS.  The examination was essentially unremarkable.  The formal  report is below:  Clinical Data: Parkinson's disease.  MRI BRAIN WITHOUT AND WITH CONTRAST  Technique: Stealth protocol.  Multiplanar T1- weighted images were obtained before and after the administration of 20 cc of Omniscan. 5.0, 3.0, and ultimately 1.3 mm thick images were obtained through the brain with special attention to the basal ganglia and deep commissures to allow for stereotactic planning and treatment of Parkinson's disease. There are no discrete or focal lesions demonstrated on these limited pulse sequences, which do not include a diffusion or T2- weighted pulse sequence. FLAIR images were obtained and show no significant small vessel disease or ventriculomegaly.  IMPRESSION  MRI of the brain without and with contrast performed according to Stealth protocol showing no obvious focal or enhancing lesion.   PREVIOUS MEDICATIONS: Mirapex and artane  ALLERGIES:   Allergies  Allergen Reactions  . Penicillins Rash    Because of a history of documented adverse serious drug reaction;Medi Alert bracelet  is recommended    CURRENT MEDICATIONS:     Medication List       This list is accurate as of: 02/23/14  1:14 PM.  Always use your most recent med list.               acetaminophen 325 MG tablet  Commonly known as:  TYLENOL  Take 1-2 tablets (325-650 mg total) by mouth every 4 (four) hours as needed for mild pain.     albuterol 108 (90 BASE) MCG/ACT inhaler  Commonly known as:  PROVENTIL HFA;VENTOLIN HFA  Inhale 2 puffs into the lungs every 6 (six) hours as needed for wheezing or shortness of breath.     aspirin EC 81 MG tablet  Take 81 mg by mouth daily.      atorvastatin 40 MG tablet  Commonly known as:  LIPITOR  Take 1 tablet (40 mg total) by mouth daily.     carbidopa-levodopa 25-100 MG per tablet  Commonly known as:  SINEMET IR  TAKE 1 TABLET BY MOUTH 3 (THREE) TIMES DAILY.     clonazePAM 0.5 MG tablet  Commonly known as:  KLONOPIN  Take 1.5 tablets (0.75 mg total) by mouth at bedtime.     Fluticasone-Salmeterol 250-50 MCG/DOSE Aepb  Commonly known as:  ADVAIR  Inhale 1 puff into the lungs 2 (two) times daily as needed (for copd symptoms).     furosemide 80 MG tablet  Commonly known as:  LASIX  Take 120 mg by mouth daily.     glimepiride 2 MG tablet  Commonly known as:  AMARYL  TAKE 1 TABLET BY MOUTH IN THE MORNING AND 1/2 TABLET AT NIGHT     glucose blood test strip  Commonly known as:  ONE TOUCH ULTRA TEST  Use as instructed to check blood sugar 2 times per day dx code E11.59     ibuprofen 200 MG tablet  Commonly known as:  ADVIL,MOTRIN  Take 800 mg by mouth daily as needed for pain.     Insulin Pen Needle 32G X 5 MM Misc  Commonly known as:  NOVOTWIST  Use one per day to inject Victoza     KLOR-CON M10 10 MEQ tablet  Generic drug:  potassium chloride  TAKE 3 TABLETS BY MOUTH DAILY     levothyroxine 125 MCG tablet  Commonly known as:  SYNTHROID, LEVOTHROID  1 qd EXCEPT 1&1/2 on Weds     Liraglutide 18 MG/3ML Sopn  Commonly known as:  VICTOZA  Inject 1.2 mg once a day  metFORMIN 1000 MG tablet  Commonly known as:  GLUCOPHAGE  Take 1 tablet (1,000 mg total) by mouth 2 (two) times daily with a meal.     metoprolol succinate 50 MG 24 hr tablet  Commonly known as:  TOPROL-XL  Take 1 tablet (50 mg total) by mouth at bedtime. Take with or immediately following a meal.     multivitamin with minerals Tabs tablet  Take 1 tablet by mouth daily. Antioxidant  Multi vitamin     nitroGLYCERIN 0.4 MG SL tablet  Commonly known as:  NITROSTAT  Place 1 tablet (0.4 mg total) under the tongue every 5 (five) minutes as  needed for chest pain.     ONETOUCH DELICA LANCETS FINE Misc  Check Blood Sugar Daily     pramipexole 0.5 MG tablet  Commonly known as:  MIRAPEX  Take 0.5 mg by mouth 2 (two) times daily.     spironolactone 25 MG tablet  Commonly known as:  ALDACTONE  Take 1 tablet (25 mg total) by mouth daily.     traMADol 50 MG tablet  Commonly known as:  ULTRAM  TAKE 1 TABLET TWICE A DAY AS NEED     travoprost (benzalkonium) 0.004 % ophthalmic solution  Commonly known as:  TRAVATAN  Place 1 drop into the right eye at bedtime.     trihexyphenidyl 2 MG tablet  Commonly known as:  ARTANE  Take 2 mg by mouth 2 (two) times daily with a meal.     vitamin B-12 1000 MCG tablet  Commonly known as:  CYANOCOBALAMIN  Take 1,000 mcg by mouth daily.         PAST MEDICAL HISTORY:   Past Medical History  Diagnosis Date  . Cardiomyopathy   . CAD (coronary artery disease)   . HTN (hypertension)     pt denies 08/19/12  . MI (myocardial infarction)   . Sleep apnea   . GERD (gastroesophageal reflux disease)   . Hyperthyroidism   . Hyperplasia, prostate   . Benign neoplasm of colon   . Nephrolithiasis   . Ventral hernia   . HLD (hyperlipidemia)   . Parkinson disease     1999  . OSA (obstructive sleep apnea)     AHI-28,on CPAP, noncompliant with CPAP  . Sleep apnea, organic   . Complication of anesthesia     pt states that he got a rash  . Arthritis   . Shortness of breath     Hx: of at all times  . UTI (lower urinary tract infection) 09/15/12    Klebsiella  . Asthma   . DM (diabetes mellitus)     TYPE 2     PAST SURGICAL HISTORY:   Past Surgical History  Procedure Laterality Date  . Coronary artery bypass graft  2000    [name], MD  . Coronary stent placement  1998  . Median sternotomy  2000  . Deep brain stimulator placement  2004    Right and left VIN stimulator placement   . Acoustic neuroma resection  1981  . Finger amputation      left pointer  . Cataract extraction  w/ intraocular lens implant      Hx: of right eye  . Tonsillectomy    . Colonoscopy w/ biopsies and polypectomy      Hx: of  . Cardiac catheterization    . Subthalamic stimulator battery replacement N/A 09/05/2012    Procedure: Deep brain stimulator battery change;  Surgeon: Erline Levine, MD;  Location: [facility];  Service: Neurosurgery;  Laterality: N/A;  Deep brain stimulator battery change  . Left and right heart catheterization with coronary angiogram N/A 09/24/2013    Procedure: LEFT AND RIGHT HEART CATHETERIZATION WITH CORONARY ANGIOGRAM;  Surgeon: Burnell Blanks, MD;  Location: [facility] CATH LAB;  Service: Cardiovascular;  Laterality: N/A;  . Implantable cardioverter defibrillator implant N/A 11/13/2013    Procedure: IMPLANTABLE CARDIOVERTER DEFIBRILLATOR IMPLANT;  Surgeon: Evans Lance, MD;  Location: [facility] CATH LAB;  Service: Cardiovascular;  Laterality: N/A;    SOCIAL HISTORY:   History   Social History  . Marital Status: Married    Spouse Name: [name]  . Number of Children: 2  . Years of Education: N/A   Occupational History  . 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 Smoking status: Never Smoker   . Smokeless tobacco: Never Used  . Alcohol Use: Yes     Comment: occasional wine  . Drug Use: No  . Sexual Activity: Not on file   Other Topics Concern  . Not on file   Social History Narrative    FAMILY HISTORY:   Family Status  Relation Status Death Age  . Mother Deceased     complications of surgery  . Father Deceased     EtOHism  . Sister Alive     2 full, one half; PVD  . Brother Deceased     AIDS  . Child Alive     3, alive and well    ROS:  A complete 10 system review of systems was obtained and was unremarkable apart from what is mentioned above.  PHYSICAL EXAMINATION:    VITALS:   Filed Vitals:   02/23/14 1250  BP: 126/62  Pulse: 80  Height: 5' 8.5" (1.74 m)  Weight: 271 lb (122.925 kg)    GEN:  The  patient appears stated age and is in NAD. HEENT:  Normocephalic, atraumatic.  The mucous membranes are moist. The superficial temporal arteries are without ropiness or tenderness. CV:  RRR Lungs:  CTAB Neck/HEME:  There are no carotid bruits bilaterally. Skin:  There is a small scab over the right generator where he hit it with car tailgate.  No streaking, erythema, tenderness, crepitance or heat coming from area.  Neurological examination:  Orientation: The patient is alert and oriented x3. Fund of knowledge is appropriate.  Recent and remote memory are intact.  Attention and concentration are normal.    Able to name objects and repeat phrases. Cranial nerves: There is good facial symmetry. Pupils are equal round and reactive to light bilaterally. Fundoscopic exam reveals clear margins bilaterally. Extraocular muscles are intact. The visual fields are full to confrontational testing. The speech is fluent and just mildly dysarthric.  He is hypophonic. Soft palate rises symmetrically and there is no tongue deviation. Hearing is intact to conversational tone. Motor: Strength is 5/5 in the bilateral upper and lower extremities.   Shoulder shrug is equal and symmetric.  There is no pronator drift.  Movement examination: Tone: There is normal tone in the UE and LE bilaterally Abnormal movements: There is no tremor noted today Coordination:  There is no decremation with RAM's today Gait and Station: The patient has minimal difficulty arising out of a deep-seated chair without the use of the hands. The patient's stride length is decreased and he is shuffling some   LABS  Lab Results  Component Value Date   WBC  6.9 11/10/2013   HGB 12.8* 11/10/2013   HCT 39.5 11/10/2013   MCV 87.4 11/10/2013   PLT 152.0 11/10/2013   Lab Results  Component Value Date   TSH 4.60* 01/13/2014     Chemistry      Component Value Date/Time   NA 136 11/10/2013 0744   K 3.8 11/10/2013 0744   CL 96 11/10/2013  0744   CO2 31 11/10/2013 0744   BUN 17 11/10/2013 0744   CREATININE 1.1 11/10/2013 0744   CREATININE 1.04 06/05/2013 1020      Component Value Date/Time   CALCIUM 8.7 11/10/2013 0744   ALKPHOS 59 04/08/2013 0937   AST 18 04/08/2013 0937   ALT 34 04/08/2013 0937   BILITOT 0.7 04/08/2013 0937     Lab Results  Component Value Date   VITAMINB12 268 08/19/2012   Lab Results  Component Value Date   HGBA1C 9.7* 01/13/2014   DBS programming was performed today, which is described in more detail on a separate programming procedural notes.  In short, there was no significant change following programming.  ASSESSMENT/PLAN:  1.  Idiopathic Parkinson's disease.    -Overall, the patient feels that memory is not as good as it used to be.  I talked to him about both the Artane and Mirapex, but he really does not wish to change those medications.  He is also taking Ultram every 3 hours and I wonder if this contributes as well.  -The patient thinks that levodopa is causing side effects, but I think that he is just seeing an advancement of the Parkinson's.  I will hold the levodopa for the next few days and then will call him.  -He does not wish to go to the neuro rehabilitation center currently for the Parkinson's program  -Programmed the patient programmer so that he could go up or down by 0.2 V on each side but he has not had to adjust it in quite some time  -I. talked to him about moving his levodopa closer together so that he takes it before breakfast, before lunch and before dinner.  I have discussed this with him previously, but he continues to take his last levodopa at 10 PM.  -I asked him to try a walking stick instead of a cane.  He is not using the cane faithfully either.  -call if redness/erythema/streaking/fevers near generator where pt sustained trauma 2.  RBD  -He will continue his clonazepam 0.5 mg, to 1-1/2 tablets per night  3.  OSAS, noncompliant with CPAP.  -Unfortunately, he  has been unable to tolerate CPAP 4.  Sialorrhea  -This is associated with Parkinson's disease.  He may benefit from Myobloc in the future. 5.  Dysphagia.  -Overall, this is fairly mild but I will give an eye on this and consider a MBE if needed. 6.  CHF  -ICD placed on 11/15/2013 7.  Constipation  -copy of rancho recipe given 8.  Uncontrolled DM  -Think this is main reason why doesn't feel well but pt going to hold levodopa a few days. 9.  Return in about 4 months (around 06/24/2014).

## 2014-02-26 ENCOUNTER — Encounter: Payer: Self-pay | Admitting: Internal Medicine

## 2014-02-26 ENCOUNTER — Ambulatory Visit (INDEPENDENT_AMBULATORY_CARE_PROVIDER_SITE_OTHER): Payer: Federal, State, Local not specified - PPO | Admitting: Internal Medicine

## 2014-02-26 ENCOUNTER — Telehealth: Payer: Self-pay | Admitting: Neurology

## 2014-02-26 VITALS — BP 128/80 | HR 84 | Ht 69.0 in | Wt 270.0 lb

## 2014-02-26 DIAGNOSIS — I5022 Chronic systolic (congestive) heart failure: Secondary | ICD-10-CM | POA: Diagnosis not present

## 2014-02-26 DIAGNOSIS — Z95810 Presence of automatic (implantable) cardiac defibrillator: Secondary | ICD-10-CM

## 2014-02-26 DIAGNOSIS — I10 Essential (primary) hypertension: Secondary | ICD-10-CM | POA: Diagnosis not present

## 2014-02-26 DIAGNOSIS — I2510 Atherosclerotic heart disease of native coronary artery without angina pectoris: Secondary | ICD-10-CM

## 2014-02-26 DIAGNOSIS — I255 Ischemic cardiomyopathy: Secondary | ICD-10-CM

## 2014-02-26 LAB — MDC_IDC_ENUM_SESS_TYPE_INCLINIC
Battery Remaining Longevity: 132 mo
Brady Statistic AP VS Percent: 1.85 %
Brady Statistic AS VP Percent: 0.03 %
Brady Statistic AS VS Percent: 98.11 %
Brady Statistic RA Percent Paced: 1.86 %
Brady Statistic RV Percent Paced: 0.04 %
HighPow Impedance: 209 Ohm
HighPow Impedance: 74 Ohm
Lead Channel Impedance Value: 513 Ohm
Lead Channel Pacing Threshold Amplitude: 0.5 V
Lead Channel Sensing Intrinsic Amplitude: 2.25 mV
Lead Channel Sensing Intrinsic Amplitude: 21.625 mV
Lead Channel Setting Pacing Amplitude: 2 V
Lead Channel Setting Pacing Amplitude: 2.5 V
Lead Channel Setting Pacing Pulse Width: 0.4 ms
MDC IDC MSMT BATTERY VOLTAGE: 3.11 V
MDC IDC MSMT LEADCHNL RA PACING THRESHOLD AMPLITUDE: 1 V
MDC IDC MSMT LEADCHNL RA PACING THRESHOLD PULSEWIDTH: 0.4 ms
MDC IDC MSMT LEADCHNL RV IMPEDANCE VALUE: 513 Ohm
MDC IDC MSMT LEADCHNL RV PACING THRESHOLD PULSEWIDTH: 0.4 ms
MDC IDC SESS DTM: 20160218164745
MDC IDC SET LEADCHNL RV SENSING SENSITIVITY: 0.3 mV
MDC IDC SET ZONE DETECTION INTERVAL: 350 ms
MDC IDC SET ZONE DETECTION INTERVAL: 450 ms
MDC IDC STAT BRADY AP VP PERCENT: 0.01 %
Zone Setting Detection Interval: 300 ms
Zone Setting Detection Interval: 360 ms

## 2014-02-26 NOTE — Assessment & Plan Note (Signed)
His blood pressure is well controlled. No change in meds. He is encouraged to lose weight.

## 2014-02-26 NOTE — Patient Instructions (Signed)
Your physician wants you to follow-up in: 9 months with Dr Knox Saliva will receive a reminder letter in the mail two months in advance. If you don't receive a letter, please call our office to schedule the follow-up appointment.  Remote monitoring is used to monitor your Pacemaker or ICD from home. This monitoring reduces the number of office visits required to check your device to one time per year. It allows [place] to keep an eye on the functioning of your device to ensure it is working properly. You are scheduled for a device check from home on 05/28/14. You may send your transmission at any time that day. If you have a wireless device, the transmission will be sent automatically. After your physician reviews your transmission, you will receive a postcard with your next transmission date.

## 2014-02-26 NOTE — Progress Notes (Signed)
HPI [name] returns today for followup. He is a pleasant 64 yo man with a h/o chronic systolic heart failure, ICM, s/p ICD implantation. In the interim, he has been stable although he notes that his blood sugar has been elevated. No chest pain. He has class 2 sob. No worsening edema Allergies  Allergen Reactions  . Penicillins Rash    Because of a history of documented adverse serious drug reaction;Medi Alert bracelet  is recommended     Current Outpatient Prescriptions  Medication Sig Dispense Refill  . acetaminophen (TYLENOL) 325 MG tablet Take 1-2 tablets (325-650 mg total) by mouth every 4 (four) hours as needed for mild pain.    Marland Kitchen albuterol (PROVENTIL HFA;VENTOLIN HFA) 108 (90 BASE) MCG/ACT inhaler Inhale 2 puffs into the lungs every 6 (six) hours as needed for wheezing or shortness of breath. 18 g 5  . aspirin EC 81 MG tablet Take 81 mg by mouth daily.    Marland Kitchen atorvastatin (LIPITOR) 40 MG tablet Take 1 tablet (40 mg total) by mouth daily. 90 tablet 3  . carbidopa-levodopa (SINEMET IR) 25-100 MG per tablet TAKE 1 TABLET BY MOUTH 3 (THREE) TIMES DAILY. 90 tablet 5  . clonazePAM (KLONOPIN) 0.5 MG tablet Take 1.5 tablets (0.75 mg total) by mouth at bedtime. 135 tablet 1  . Fluticasone-Salmeterol (ADVAIR) 250-50 MCG/DOSE AEPB Inhale 1 puff into the lungs 2 (two) times daily as needed (for copd symptoms). 60 each 5  . furosemide (LASIX) 80 MG tablet Take 120 mg by mouth daily.    Marland Kitchen glimepiride (AMARYL) 2 MG tablet TAKE 1 TABLET BY MOUTH IN THE MORNING AND 1/2 TABLET AT NIGHT 135 tablet 1  . glucose blood (ONE TOUCH ULTRA TEST) test strip Use as instructed to check blood sugar 2 times per day dx code E11.59 100 each 3  . ibuprofen (ADVIL,MOTRIN) 200 MG tablet Take 800 mg by mouth daily as needed for pain.    . Insulin Pen Needle (NOVOTWIST) 32G X 5 MM MISC Use one per day to inject Victoza 50 each 2  . KLOR-CON M10 10 MEQ tablet TAKE 3 TABLETS BY MOUTH DAILY 90 tablet 1  .  levothyroxine (SYNTHROID, LEVOTHROID) 125 MCG tablet TAKE 1 TABLET EVERY DAY DAILY BEFORE BREAKFAST (Patient taking differently: TAKE 1 TABLET BY MOUTH EVERY DAY DAILY BEFORE BREAKFAST) 90 tablet 1  . Liraglutide (VICTOZA) 18 MG/3ML SOPN Inject 1.2 mg once a day (Patient taking differently: Inject 1.2 mg into the skin once a day) 6 mL 2  . metFORMIN (GLUCOPHAGE) 1000 MG tablet Take 1 tablet (1,000 mg total) by mouth 2 (two) times daily with a meal. 180 tablet 3  . metoprolol succinate (TOPROL-XL) 50 MG 24 hr tablet Take 1 tablet (50 mg total) by mouth at bedtime. Take with or immediately following a meal. 30 tablet 06  . Multiple Vitamin (MULTIVITAMIN WITH MINERALS) TABS tablet Take 1 tablet by mouth daily. Antioxidant  Multi vitamin    . nitroGLYCERIN (NITROSTAT) 0.4 MG SL tablet Place 1 tablet (0.4 mg total) under the tongue every 5 (five) minutes as needed for chest pain. 25 tablet 2  . ONETOUCH DELICA LANCETS FINE MISC Check Blood Sugar Daily 100 each 0  . pramipexole (MIRAPEX) 0.5 MG tablet Take 0.5 mg by mouth 2 (two) times daily.    Marland Kitchen spironolactone (ALDACTONE) 25 MG tablet Take 1 tablet (25 mg total) by mouth daily. 90 tablet 3  . traMADol (ULTRAM) 50 MG tablet TAKE 1  TABLET TWICE A DAY AS NEED (Patient taking differently: TAKE 1 TABLET BY MOUTH TWICE A DAY AS NEEDED FOR PAIN) 60 tablet 0  . travoprost, benzalkonium, (TRAVATAN) 0.004 % ophthalmic solution Place 1 drop into the right eye at bedtime.     . trihexyphenidyl (ARTANE) 2 MG tablet Take 2 mg by mouth 2 (two) times daily with a meal.    . vitamin B-12 (CYANOCOBALAMIN) 1000 MCG tablet Take 1,000 mcg by mouth daily.     No current facility-administered medications for this visit.     Past Medical History  Diagnosis Date  . Cardiomyopathy   . CAD (coronary artery disease)   . HTN (hypertension)     pt denies 08/19/12  . MI (myocardial infarction)   . Sleep apnea   . GERD (gastroesophageal reflux disease)   . Hyperthyroidism     . Hyperplasia, prostate   . Benign neoplasm of colon   . Nephrolithiasis   . Ventral hernia   . HLD (hyperlipidemia)   . Parkinson disease     1999  . OSA (obstructive sleep apnea)     AHI-28,on CPAP, noncompliant with CPAP  . Sleep apnea, organic   . Complication of anesthesia     pt states that he got a rash  . Arthritis   . Shortness of breath     Hx: of at all times  . UTI (lower urinary tract infection) 09/15/12    Klebsiella  . Asthma   . DM (diabetes mellitus)     TYPE 2     ROS:   All systems reviewed and negative except as noted in the HPI.   Past Surgical History  Procedure Laterality Date  . Coronary artery bypass graft  2000    [name], MD  . Coronary stent placement  1998  . Median sternotomy  2000  . Deep brain stimulator placement  2004    Right and left VIN stimulator placement   . Acoustic neuroma resection  1981  . Finger amputation      left pointer  . Cataract extraction w/ intraocular lens implant      Hx: of right eye  . Tonsillectomy    . Colonoscopy w/ biopsies and polypectomy      Hx: of  . Cardiac catheterization    . Subthalamic stimulator battery replacement N/A 09/05/2012    Procedure: Deep brain stimulator battery change;  Surgeon: Erline Levine, MD;  Location: [facility];  Service: Neurosurgery;  Laterality: N/A;  Deep brain stimulator battery change  . Left and right heart catheterization with coronary angiogram N/A 09/24/2013    Procedure: LEFT AND RIGHT HEART CATHETERIZATION WITH CORONARY ANGIOGRAM;  Surgeon: Burnell Blanks, MD;  Location: [facility] CATH LAB;  Service: Cardiovascular;  Laterality: N/A;  . Implantable cardioverter defibrillator implant N/A 11/13/2013    Procedure: IMPLANTABLE CARDIOVERTER DEFIBRILLATOR IMPLANT;  Surgeon: Evans Lance, MD;  Location: [facility] CATH LAB;  Service: Cardiovascular;  Laterality: N/A;     Family History  Problem Relation Age of Onset  . Peripheral vascular disease    . Arthritis    .  Alcoholism Mother 84    deceased   . Aneurysm Father 16  . Autoimmune disease Brother 62    AIDS  . Diabetes Neg Hx   . Heart disease Neg Hx      History   Social History  . Marital Status: Married    Spouse Name: [name]  . Number of Children: 2  .  Years of Education: N/A   Occupational History  . 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 Smoking status: Never Smoker   . Smokeless tobacco: Never Used  . Alcohol Use: Yes     Comment: occasional wine  . Drug Use: No  . Sexual Activity: Not on file   Other Topics Concern  . Not on file   Social History Narrative     BP 128/80 mmHg  Pulse 84  Ht 5\' 9"  (1.753 m)  Wt 270 lb (122.471 kg)  BMI 39.85 kg/m2  Physical Exam:  Well appearing 64 yo woman, NAD HEENT: Unremarkable Neck:  No JVD, no thyromegally Back:  No CVA tenderness Lungs:  Clear with no wheezes HEART:  Regular rate rhythm, no murmurs, no rubs, no clicks Abd:  soft, positive bowel sounds, no organomegally, no rebound, no guarding Ext:  2 plus pulses, no edema, no cyanosis, no clubbing Skin:  No rashes no nodules Neuro:  CN II through XII intact, motor grossly intact  ECG - NSR with NSSTT changes.  DEVICE  Normal device function.  See PaceArt for details.   Assess/Plan:

## 2014-02-26 NOTE — Assessment & Plan Note (Signed)
His symptoms are class 2. He will continue his current medical therapy. He is encouraged to reduce his sodium intake.

## 2014-02-26 NOTE — Assessment & Plan Note (Signed)
His Medtronic ICD is working normally. Will recheck in several months. 

## 2014-02-26 NOTE — Telephone Encounter (Signed)
Spoke with patient to see how he is doing after holding the Levodopa. He states he is doing much better. He will stay off the medication for now and call with any problems.

## 2014-03-12 ENCOUNTER — Encounter: Payer: Federal, State, Local not specified - PPO | Attending: Endocrinology | Admitting: Dietician

## 2014-03-12 ENCOUNTER — Encounter: Payer: Self-pay | Admitting: Dietician

## 2014-03-12 VITALS — Ht 69.0 in | Wt 268.0 lb

## 2014-03-12 DIAGNOSIS — E1165 Type 2 diabetes mellitus with hyperglycemia: Secondary | ICD-10-CM

## 2014-03-12 DIAGNOSIS — E1159 Type 2 diabetes mellitus with other circulatory complications: Secondary | ICD-10-CM

## 2014-03-12 DIAGNOSIS — Z713 Dietary counseling and surveillance: Secondary | ICD-10-CM | POA: Diagnosis not present

## 2014-03-12 DIAGNOSIS — Z794 Long term (current) use of insulin: Secondary | ICD-10-CM | POA: Diagnosis not present

## 2014-03-12 DIAGNOSIS — E1151 Type 2 diabetes mellitus with diabetic peripheral angiopathy without gangrene: Secondary | ICD-10-CM | POA: Diagnosis present

## 2014-03-12 NOTE — Progress Notes (Signed)
Medical Nutrition Therapy:  Appt start time: 1430 end time:  4098.   Assessment:  Primary concerns today: Patient is here today with his wife at the recommendation of Dr. Dwyane Dee for uncontrolled Diabetes.  They would like to learn how to eat to improve blood sugar control. Patient checks his blood sugar in the morning and before bed.  Patient is on disability for parkinson's disease.  Hx also includes chronic systolic heart failure, ICM s/ ICD implantation. Wife works nights at the [employer].   Wife and daughter do the shopping and cooking.  Patient lives with wife, daughter, son in law and 54 yo grandchildren.  Daughter and family moved in with patient to be there when wife works.  Wife states that she is retiring this year.  Patient also has sleep apnea but is unable to use C-pap due to claustrophobia.  Patient checks blood sugar bid:  It is 180-190 both in the morning and before bed.  HgbA1C was 9.7% 1 5/16 which is decreased from 12.8% 09/20/13 and 13.4% 05/12/13.  Preferred Learning Style:   No preference indicated  Learning Readiness:   Ready  MEDICATIONS: [place] on [place].  Patient will change to Metformin when Janumet runs out.   DIETARY INTAKE: Patient complains of being hungry all of he time.  Wife eats a lot of high calorie snacks to maintain her weight but patient cannot control amounts eaten.    Usual eating pattern includes 3-4 meals and 3 snacks per day. They follow a no added salt diet.  24-hr recall:  B (5-6AM): cereal with milk or pop tarts or oatmeal with peanut butter and honey to take meds then (8am) french toast with honey, or gravy biscuit with  eggs, bacon, grits (out to eat almost every morning,  with friends) coffee with artificial sweetener and honey and cream or with regular hot cocoa in the coffee. Snk ( AM):  Pack of peanut butter crackers L (2PM): peanut butter sandwich on [employer] or [place] sandwich or [employer] or out for fish. Snk ( PM):   D  (5-6PM): tacos made with ground [place] or [place] burgers or  Snk ( PM): frozen pineapple and mango or pound cake, or ice cream with brownies Beverages: sprite zero or coffee mixed with regular hot cocoa or cream and honey, wate  Usual physical activity: none  Estimated energy needs: 2000 calories 225 g carbohydrates 125 g protein 67 g fat  Progress Towards Goal(s):  In progress.   Nutritional Diagnosis:  NB-1.1 Food and nutrition-related knowledge deficit As related to balance of carbohydrates, protein, and fat.  As evidenced by diet hx.    Intervention:  Nutrition counseling and diabetes education initiated. Discussed Carb Counting by food group as method of portion control, reading food labels, and benefits of increased activity. Also discussed basic physiology of Diabetes, target BG ranges pre and post meals, and A1c.   Plan:  Avoid drinking sugar. Be mindful of portion sizes and carbohydrate amounts of snacks in particular. Aim for 3-4 Carb Choices per meal (60 grams) +/- 1 either way  Aim for 0-1 Carbs per snack if hungry  Include protein in moderation with your meals and snacks Consider reading food labels for Total Carbohydrate and Fat Grams of foods Consider  increasing your activity level by doing armchair exercises for 30 minutes daily as tolerated Consider checking BG at alternate times per day as directed by MD  Consider taking medication as directed by MD  Teaching Method Utilized:  Visual Auditory  Handouts given during visit include:  My plate placemat  Yellow meal card  Label reading  Snack list  Barriers to learning/adherence to lifestyle change:  He is always hungry.  Chronic health issues.  Demonstrated degree of understanding via:  Teach Back   Monitoring/Evaluation:  Dietary intake, exercise, label reading, and body weight prn  .

## 2014-03-12 NOTE — Patient Instructions (Signed)
Plan:  Avoid drinking sugar. Be mindful of portion sizes and carbohydrate amounts of snacks in particular. Aim for 3-4 Carb Choices per meal (60 grams) +/- 1 either way  Aim for 0-1 Carbs per snack if hungry  Include protein in moderation with your meals and snacks Consider reading food labels for Total Carbohydrate and Fat Grams of foods Consider  increasing your activity level by doing armchair exercises for 30 minutes daily as tolerated Consider checking BG at alternate times per day as directed by MD  Consider taking medication as directed by MD

## 2014-03-17 ENCOUNTER — Other Ambulatory Visit (INDEPENDENT_AMBULATORY_CARE_PROVIDER_SITE_OTHER): Payer: Federal, State, Local not specified - PPO

## 2014-03-17 DIAGNOSIS — E1165 Type 2 diabetes mellitus with hyperglycemia: Secondary | ICD-10-CM

## 2014-03-17 DIAGNOSIS — IMO0002 Reserved for concepts with insufficient information to code with codable children: Secondary | ICD-10-CM

## 2014-03-17 LAB — BASIC METABOLIC PANEL
BUN: 18 mg/dL (ref 6–23)
CO2: 33 mEq/L — ABNORMAL HIGH (ref 19–32)
CREATININE: 1.13 mg/dL (ref 0.40–1.50)
Calcium: 9 mg/dL (ref 8.4–10.5)
Chloride: 98 mEq/L (ref 96–112)
GFR: 69.75 mL/min (ref >60.00)
Glucose, Bld: 145 mg/dL — ABNORMAL HIGH (ref 70–99)
Potassium: 4.1 mEq/L (ref 3.5–5.1)
SODIUM: 138 meq/L (ref 135–145)

## 2014-03-18 LAB — FRUCTOSAMINE: FRUCTOSAMINE: 299 umol/L — AB (ref 0–285)

## 2014-03-20 ENCOUNTER — Encounter: Payer: Self-pay | Admitting: Endocrinology

## 2014-03-20 ENCOUNTER — Ambulatory Visit (INDEPENDENT_AMBULATORY_CARE_PROVIDER_SITE_OTHER): Payer: Federal, State, Local not specified - PPO | Admitting: Endocrinology

## 2014-03-20 VITALS — BP 124/67 | HR 89 | Temp 98.6°F | Resp 16 | Ht 69.0 in | Wt 268.6 lb

## 2014-03-20 DIAGNOSIS — IMO0002 Reserved for concepts with insufficient information to code with codable children: Secondary | ICD-10-CM

## 2014-03-20 DIAGNOSIS — E1165 Type 2 diabetes mellitus with hyperglycemia: Secondary | ICD-10-CM

## 2014-03-20 DIAGNOSIS — I2510 Atherosclerotic heart disease of native coronary artery without angina pectoris: Secondary | ICD-10-CM

## 2014-03-20 MED ORDER — LIRAGLUTIDE 18 MG/3ML ~~LOC~~ SOPN: PEN_INJECTOR | SUBCUTANEOUS | Status: DC

## 2014-03-20 MED ORDER — GLIMEPIRIDE 4 MG PO TABS: ORAL_TABLET | ORAL | Status: DC

## 2014-03-20 NOTE — Patient Instructions (Addendum)
Victoza 1.8 mg daily  AMARYL 2 PILLS in am and 1 at supper  Call if sugar starts to get below 90

## 2014-03-20 NOTE — Progress Notes (Signed)
Patient ID: Henry [name], male   DOB: 04/06/1950, 64 y.o.   MRN: [id number]           Reason for Appointment: Follow-up for Type 2 Diabetes  Referring physician: Linna Darner  History of Present Illness:          Diagnosis: Type 2 diabetes mellitus, date of diagnosis: 2000      Past history:   Patient thinks he has been taking Amaryl for several years and probably metformin since onset also At some point he was changed from metformin to [facility] and has been taking this since at least 2012 His A1c in the past has generally been around 7.5 However it has been higher in 2015, he has continued the same medication regimen  Recent history:  Since his A1c had been progressively higher with his regimen of Janumet and Amaryl he was started on Victoza in 01/2014 On his follow-up in 2/16 since he was only taking 0.6 mg he was told to increase it to 1.2 mg. However his blood sugars are still overall significantly high as seen on his home monitoring Also blood sugars appear to be higher in the last 3 weeks He has just seen the dietitian a few days ago in 3/16 and he thinks he can make significant changes in his diet He has been compliant with all his medications and still has not switched over his Janumet to metformin He has checked some readings at all different times and they are slightly higher in the evenings overall Fructosamine is mildly increased       Oral hypoglycemic drugs the patient is taking are: Janumet 50/1000 twice a day, Amaryl 2 mg a.m. and 1 mg p.m.   Side effects from medications have been: None Compliance with the medical regimen: Fair  Hypoglycemia:   none  Glucose monitoring:  done one time a day         Glucometer: One Touch.      Blood Glucose readings by download recently; overall average is for the last 4 weeks  PRE-MEAL Breakfast Lunch Dinner Bedtime Overall  Glucose range:  149-253   148-211    145-270    Mean:  192      199    Self-care: The diet that the  patient has been following is: None, tries to eat more vegetables .           Exercise: unable to do any         Dietician visit, most recent: 03/2014                Weight history: 266-277  Wt Readings from Last 3 Encounters:  03/20/14 268 lb 9.6 oz (121.836 kg)  03/12/14 268 lb (121.564 kg)  02/26/14 270 lb (122.471 kg)    Glycemic control:   Lab Results  Component Value Date   HGBA1C 9.7* 01/13/2014   HGBA1C 8.7* 08/06/2013   HGBA1C 8.3* 04/08/2013   Lab Results  Component Value Date   MICROALBUR 0.9 01/13/2014   LDLCALC 126* 04/08/2013   CREATININE 1.13 03/17/2014         Medication List       This list is accurate as of: 03/20/14 11:59 PM.  Always use your most recent med list.               acetaminophen 325 MG tablet  Commonly known as:  TYLENOL  Take 1-2 tablets (325-650 mg total) by mouth every 4 (four) hours as needed for  mild pain.     albuterol 108 (90 BASE) MCG/ACT inhaler  Commonly known as:  PROVENTIL HFA;VENTOLIN HFA  Inhale 2 puffs into the lungs every 6 (six) hours as needed for wheezing or shortness of breath.     aspirin EC 81 MG tablet  Take 81 mg by mouth daily.     atorvastatin 40 MG tablet  Commonly known as:  LIPITOR  Take 1 tablet (40 mg total) by mouth daily.     carbidopa-levodopa 25-100 MG per tablet  Commonly known as:  SINEMET IR  TAKE 1 TABLET BY MOUTH 3 (THREE) TIMES DAILY.     clonazePAM 0.5 MG tablet  Commonly known as:  KLONOPIN  Take 1.5 tablets (0.75 mg total) by mouth at bedtime.     Fluticasone-Salmeterol 250-50 MCG/DOSE Aepb  Commonly known as:  ADVAIR  Inhale 1 puff into the lungs 2 (two) times daily as needed (for copd symptoms).     furosemide 80 MG tablet  Commonly known as:  LASIX  Take 120 mg by mouth daily.     glimepiride 4 MG tablet  Commonly known as:  AMARYL  1 in am and 1/2 at supper     glucose blood test strip  Commonly known as:  ONE TOUCH ULTRA TEST  Use as instructed to check blood  sugar 2 times per day dx code E11.59     ibuprofen 200 MG tablet  Commonly known as:  ADVIL,MOTRIN  Take 800 mg by mouth daily as needed for pain.     Insulin Pen Needle 32G X 5 MM Misc  Commonly known as:  NOVOTWIST  Use one per day to inject Victoza     KLOR-CON M10 10 MEQ tablet  Generic drug:  potassium chloride  TAKE 3 TABLETS BY MOUTH DAILY     levothyroxine 125 MCG tablet  Commonly known as:  SYNTHROID, LEVOTHROID  TAKE 1 TABLET EVERY DAY DAILY BEFORE BREAKFAST     Liraglutide 18 MG/3ML Sopn  Commonly known as:  VICTOZA  Inject 1.8 mg once a day     metFORMIN 1000 MG tablet  Commonly known as:  GLUCOPHAGE  Take 1 tablet (1,000 mg total) by mouth 2 (two) times daily with a meal.     metoprolol succinate 50 MG 24 hr tablet  Commonly known as:  TOPROL-XL  Take 1 tablet (50 mg total) by mouth at bedtime. Take with or immediately following a meal.     multivitamin with minerals Tabs tablet  Take 1 tablet by mouth daily. Antioxidant  Multi vitamin     nitroGLYCERIN 0.4 MG SL tablet  Commonly known as:  NITROSTAT  Place 1 tablet (0.4 mg total) under the tongue every 5 (five) minutes as needed for chest pain.     ONETOUCH DELICA LANCETS FINE Misc  Check Blood Sugar Daily     pramipexole 0.5 MG tablet  Commonly known as:  MIRAPEX  Take 0.5 mg by mouth 2 (two) times daily.     spironolactone 25 MG tablet  Commonly known as:  ALDACTONE  Take 1 tablet (25 mg total) by mouth daily.     traMADol 50 MG tablet  Commonly known as:  ULTRAM  TAKE 1 TABLET TWICE A DAY AS NEED     travoprost (benzalkonium) 0.004 % ophthalmic solution  Commonly known as:  TRAVATAN  Place 1 drop into the right eye at bedtime.     trihexyphenidyl 2 MG tablet  Commonly known as:  ARTANE  Take  2 mg by mouth 2 (two) times daily with a meal.     vitamin B-12 1000 MCG tablet  Commonly known as:  CYANOCOBALAMIN  Take 1,000 mcg by mouth daily.        Allergies:  Allergies  Allergen  Reactions  . Penicillins Rash    Because of a history of documented adverse serious drug reaction;Medi Alert bracelet  is recommended    Past Medical History  Diagnosis Date  . Cardiomyopathy   . CAD (coronary artery disease)   . HTN (hypertension)     pt denies 08/19/12  . MI (myocardial infarction)   . Sleep apnea   . GERD (gastroesophageal reflux disease)   . Hyperthyroidism   . Hyperplasia, prostate   . Benign neoplasm of colon   . Nephrolithiasis   . Ventral hernia   . HLD (hyperlipidemia)   . Parkinson disease     1999  . OSA (obstructive sleep apnea)     AHI-28,on CPAP, noncompliant with CPAP  . Sleep apnea, organic   . Complication of anesthesia     pt states that he got a rash  . Arthritis   . Shortness of breath     Hx: of at all times  . UTI (lower urinary tract infection) 09/15/12    Klebsiella  . Asthma   . DM (diabetes mellitus)     TYPE 2     Past Surgical History  Procedure Laterality Date  . Coronary artery bypass graft  2000    [name], MD  . Coronary stent placement  1998  . Median sternotomy  2000  . Deep brain stimulator placement  2004    Right and left VIN stimulator placement   . Acoustic neuroma resection  1981  . Finger amputation      left pointer  . Cataract extraction w/ intraocular lens implant      Hx: of right eye  . Tonsillectomy    . Colonoscopy w/ biopsies and polypectomy      Hx: of  . Cardiac catheterization    . Subthalamic stimulator battery replacement N/A 09/05/2012    Procedure: Deep brain stimulator battery change;  Surgeon: Erline Levine, MD;  Location: [facility];  Service: Neurosurgery;  Laterality: N/A;  Deep brain stimulator battery change  . Left and right heart catheterization with coronary angiogram N/A 09/24/2013    Procedure: LEFT AND RIGHT HEART CATHETERIZATION WITH CORONARY ANGIOGRAM;  Surgeon: Burnell Blanks, MD;  Location: [facility] CATH LAB;  Service: Cardiovascular;  Laterality: N/A;  . Implantable  cardioverter defibrillator implant N/A 11/13/2013    Procedure: IMPLANTABLE CARDIOVERTER DEFIBRILLATOR IMPLANT;  Surgeon: Evans Lance, MD;  Location: [facility] CATH LAB;  Service: Cardiovascular;  Laterality: N/A;    Family History  Problem Relation Age of Onset  . Peripheral vascular disease    . Arthritis    . Alcoholism Mother 62    deceased   . Aneurysm Father 39  . Autoimmune disease Brother 71    AIDS  . Diabetes Neg Hx   . Heart disease Neg Hx     Social History:  reports that he has never smoked. He has never used smokeless tobacco. He reports that he drinks alcohol. He reports that he does not use illicit drugs.    Review of Systems       Vision is normal. Most recent eye exam was 8/15       Lipids: On Lipitor      Lab Results  Component Value Date   CHOL 151 08/06/2013   HDL 37.20* 08/06/2013   LDLCALC 126* 04/08/2013   LDLDIRECT 88.7 08/06/2013   TRIG 262.0* 08/06/2013   CHOLHDL 4 08/06/2013                  Thyroid:  No  unusual fatigue.  He has had hypothyroidism following radioactive iodine treatment for hyperthyroidism in 2013  TSH relatively high and his dose has been adjusted by PCP  Lab Results  Component Value Date   TSH 4.60* 01/13/2014   TSH 10.27* 08/06/2013   TSH 4.29 04/08/2013   FREET4 0.98 11/20/2011   FREET4 0.91 10/16/2011   FREET4 0.29* 09/13/2011       The blood pressure has been only mildly increased but he is taking losartan for this   Diabetic foot exam in 1/16 showed shows normal monofilament sensation in the toes except decreased on the right great toe and plantar surfaces, no skin lesions or ulcers on the feet and normal pedal pulses   LABS:  Lab on 03/17/2014  Component Date Value Ref Range Status  . Sodium 03/17/2014 138  135 - 145 mEq/L Final  . Potassium 03/17/2014 4.1  3.5 - 5.1 mEq/L Final  . Chloride 03/17/2014 98  96 - 112 mEq/L Final  . CO2 03/17/2014 33* 19 - 32 mEq/L Final  . Glucose, Bld 03/17/2014 145* 70 - 99  mg/dL Final  . BUN 03/17/2014 18  6 - 23 mg/dL Final  . Creatinine, Ser 03/17/2014 1.13  0.40 - 1.50 mg/dL Final  . Calcium 03/17/2014 9.0  8.4 - 10.5 mg/dL Final  . GFR 03/17/2014 69.75  >60.00 mL/min Final  . Fructosamine 03/17/2014 299* 0 - 285 umol/L Final   Comment: Published reference interval for apparently healthy subjects between age 86 and 78 is 50 - 285 umol/L and in a poorly controlled diabetic population is 228 - 563 umol/L with a mean of 396 umol/L.     Physical Examination:  BP 124/67 mmHg  Pulse 89  Temp(Src) 98.6 F (37 C)  Resp 16  Ht 5\' 9"  (1.753 m)  Wt 268 lb 9.6 oz (121.836 kg)  BMI 39.65 kg/m2  SpO2 97%     ASSESSMENT:  Diabetes type 2, uncontrolled with obesity    He appears to be not benefiting from continuing day Victoza even with 1.2 mg and blood sugars are higher in the last couple of weeks at least However with speaking to the dietitian and his blood sugars are starting to be a little better in the mornings the last couple of days and he may continue to benefit from modification of his diet Unable to exercise or get his weight down with current regimen Currently taking low dose Amaryl  PLAN:  He will increase Victoza to 1.8 mg.  If having nausea he can take 5 clicks beyond the 1.2 mg dose. Will also switch his Janumet to metformin 1 g twice a day He will take a larger dose of Amaryl in the morning with 4 mg and continue to milligram in the evening and smaller   Patient Instructions  Victoza 1.8 mg daily  AMARYL 2 PILLS in am and 1 at supper  Call if sugar starts to get below 90    Henry [name] 03/23/2014, 1:00 PM   Note: This office note was prepared with Estate agent. Any transcriptional errors that result from this process are unintentional.

## 2014-03-30 ENCOUNTER — Other Ambulatory Visit: Payer: Self-pay | Admitting: *Deleted

## 2014-03-30 MED ORDER — METOPROLOL SUCCINATE ER 50 MG PO TB24: 50.0000 mg | ORAL_TABLET | Freq: Every day | ORAL | Status: DC

## 2014-03-31 ENCOUNTER — Ambulatory Visit (INDEPENDENT_AMBULATORY_CARE_PROVIDER_SITE_OTHER): Payer: Federal, State, Local not specified - PPO | Admitting: Podiatry

## 2014-03-31 ENCOUNTER — Encounter: Payer: Self-pay | Admitting: Podiatry

## 2014-03-31 VITALS — BP 154/77 | HR 82 | Resp 16

## 2014-03-31 DIAGNOSIS — I2510 Atherosclerotic heart disease of native coronary artery without angina pectoris: Secondary | ICD-10-CM

## 2014-03-31 DIAGNOSIS — M47817 Spondylosis without myelopathy or radiculopathy, lumbosacral region: Secondary | ICD-10-CM | POA: Diagnosis not present

## 2014-03-31 DIAGNOSIS — L600 Ingrowing nail: Secondary | ICD-10-CM | POA: Diagnosis not present

## 2014-03-31 NOTE — Progress Notes (Signed)
   Subjective:    Patient ID: Henry [name], male    DOB: 08-06-1950, 64 y.o.   MRN: [id number]  HPI Pt presents with right 4th nail ingrown red and swollen  Review of Systems  All other systems reviewed and are negative.      Objective:   Physical Exam        Assessment & Plan:

## 2014-03-31 NOTE — Patient Instructions (Signed)

## 2014-04-01 NOTE — Progress Notes (Signed)
Subjective:     Patient ID: Henry [name], male   DOB: 1950-12-10, 64 y.o.   MRN: [id number]  HPI patient states I'm an ingrown toenail on my right fourth that is making it hard for me to wear shoe gear comfortably and we cannot trim it and there's been a little bit of drainage   Review of Systems  All other systems reviewed and are negative.      Objective:   Physical Exam  Constitutional: He is oriented to person, place, and time.  Cardiovascular: Intact distal pulses.   Musculoskeletal: Normal range of motion.  Neurological: He is oriented to person, place, and time.  Skin: Skin is warm.  Nursing note and vitals reviewed.  neurovascular status found to be intact muscle strength adequate range of motion within normal limits. 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 They want procedure and today I went ahead and infiltrated the right fourth toe 60 mg Xylocaine Marcaine mixture remove the lateral border exposed the matrix and applied phenol 3 applications 30 seconds followed by alcohol lavaged and sterile dressing. Reappoint to recheck

## 2014-04-08 ENCOUNTER — Other Ambulatory Visit: Payer: Self-pay

## 2014-04-08 MED ORDER — TRAMADOL HCL 50 MG PO TABS: ORAL_TABLET | ORAL | Status: DC

## 2014-04-08 NOTE — Telephone Encounter (Signed)
OK X1 

## 2014-04-08 NOTE — Telephone Encounter (Signed)
Tramadol has been called to [facility] 

## 2014-04-13 ENCOUNTER — Ambulatory Visit (INDEPENDENT_AMBULATORY_CARE_PROVIDER_SITE_OTHER): Payer: Federal, State, Local not specified - PPO | Admitting: Podiatry

## 2014-04-13 ENCOUNTER — Encounter: Payer: Self-pay | Admitting: Podiatry

## 2014-04-13 ENCOUNTER — Ambulatory Visit (INDEPENDENT_AMBULATORY_CARE_PROVIDER_SITE_OTHER): Payer: Federal, State, Local not specified - PPO

## 2014-04-13 VITALS — BP 118/70 | HR 82 | Resp 18

## 2014-04-13 DIAGNOSIS — R52 Pain, unspecified: Secondary | ICD-10-CM

## 2014-04-13 DIAGNOSIS — I2510 Atherosclerotic heart disease of native coronary artery without angina pectoris: Secondary | ICD-10-CM

## 2014-04-15 NOTE — Progress Notes (Signed)
Subjective:     Patient ID: Henry [name], male   DOB: Mar 24, 1950, 64 y.o.   MRN: [id number]  HPI patient states I just wanted to get this toenail checked is and had a little bit of bleeding and it. States it's not hurting   Review of Systems     Objective:   Physical Exam Neurovascular status intact with a small amount of irritated tissue on the right fourth nail bed medial side that's localized in nature with no proximal edema erythema or drainage noted    Assessment:     Mild localized paronychia infection with previous ingrown toenail removal right fourth toe    Plan:     As a precautionary measure I x-rayed his he also did traumatize his big toe and had bruising. I then went ahead and advised him on soaks elevation and keeping the area dry. If symptoms were to get worse or redness or proximal drainage were to occur or swelling he is to let us know immediately

## 2014-04-18 ENCOUNTER — Other Ambulatory Visit: Payer: Self-pay | Admitting: Internal Medicine

## 2014-05-19 ENCOUNTER — Other Ambulatory Visit (INDEPENDENT_AMBULATORY_CARE_PROVIDER_SITE_OTHER): Payer: Federal, State, Local not specified - PPO

## 2014-05-19 ENCOUNTER — Other Ambulatory Visit: Payer: Self-pay | Admitting: Internal Medicine

## 2014-05-19 DIAGNOSIS — IMO0002 Reserved for concepts with insufficient information to code with codable children: Secondary | ICD-10-CM

## 2014-05-19 DIAGNOSIS — E1165 Type 2 diabetes mellitus with hyperglycemia: Secondary | ICD-10-CM | POA: Diagnosis not present

## 2014-05-19 LAB — LIPID PANEL
CHOL/HDL RATIO: 4
Cholesterol: 147 mg/dL (ref 0–200)
HDL: 35.5 mg/dL — ABNORMAL LOW (ref >39.00)
NonHDL: 111.5
Triglycerides: 213 mg/dL — ABNORMAL HIGH (ref 0.0–149.0)
VLDL: 42.6 mg/dL — ABNORMAL HIGH (ref 0.0–40.0)

## 2014-05-19 LAB — GLUCOSE, RANDOM: Glucose, Bld: 142 mg/dL — ABNORMAL HIGH (ref 70–99)

## 2014-05-19 LAB — LDL CHOLESTEROL, DIRECT: Direct LDL: 86 mg/dL

## 2014-05-19 LAB — HEMOGLOBIN A1C: Hgb A1c MFr Bld: 7.8 % — ABNORMAL HIGH (ref 4.6–6.5)

## 2014-05-19 MED ORDER — TRAMADOL HCL 50 MG PO TABS: ORAL_TABLET | ORAL | Status: DC

## 2014-05-19 NOTE — Telephone Encounter (Signed)
Tramadol has been called to pharmacy  

## 2014-05-19 NOTE — Telephone Encounter (Signed)
Refill for Tramadol okay?

## 2014-05-19 NOTE — Telephone Encounter (Signed)
OK x 1 

## 2014-05-20 ENCOUNTER — Ambulatory Visit: Payer: Federal, State, Local not specified - PPO | Admitting: Endocrinology

## 2014-05-22 ENCOUNTER — Ambulatory Visit (INDEPENDENT_AMBULATORY_CARE_PROVIDER_SITE_OTHER): Payer: Federal, State, Local not specified - PPO | Admitting: Endocrinology

## 2014-05-22 ENCOUNTER — Encounter: Payer: Self-pay | Admitting: Endocrinology

## 2014-05-22 ENCOUNTER — Other Ambulatory Visit: Payer: Self-pay | Admitting: *Deleted

## 2014-05-22 VITALS — BP 122/80 | HR 86 | Temp 98.6°F | Ht 69.0 in | Wt 262.0 lb

## 2014-05-22 DIAGNOSIS — E1165 Type 2 diabetes mellitus with hyperglycemia: Secondary | ICD-10-CM

## 2014-05-22 DIAGNOSIS — I2510 Atherosclerotic heart disease of native coronary artery without angina pectoris: Secondary | ICD-10-CM

## 2014-05-22 DIAGNOSIS — IMO0002 Reserved for concepts with insufficient information to code with codable children: Secondary | ICD-10-CM

## 2014-05-22 DIAGNOSIS — E890 Postprocedural hypothyroidism: Secondary | ICD-10-CM

## 2014-05-22 MED ORDER — INSULIN GLARGINE 100 UNIT/ML SOLOSTAR PEN: PEN_INJECTOR | SUBCUTANEOUS | Status: DC

## 2014-05-22 NOTE — Patient Instructions (Addendum)
LANTUS insulin: This insulin provides blood sugar control for up to 24 hours.  Start with 10 units at bedtime daily and increase by 2 units every 3 days until the waking up sugars are under 130.  Then continue the same dose. If blood sugar is under 90 for 2 days in a row, reduce the dose by 2 units.  Note that this insulin does not control the rise of blood sugar with meals    Victoza 1.2mg  daily instead of 1.8

## 2014-05-22 NOTE — Progress Notes (Signed)
Patient ID: Henry [name], male   DOB: 1950/05/11, 64 y.o.   MRN: [id number]           Reason for Appointment: Follow-up for Type 2 Diabetes  Referring physician: Linna Darner  History of Present Illness:          Diagnosis: Type 2 diabetes mellitus, date of diagnosis: 2000      Past history:   Patient thinks he has been taking Amaryl for several years and probably metformin since onset also At some point he was changed from metformin to [facility] and has been taking this since at least 2012 His A1c in the past has generally been around 7.5 However it has been higher in 2015, he has continued the same medication regimen  Recent history:  Since his A1c had been progressively higher with his regimen of Janumet and Amaryl he was started on Victoza in 01/2014 This was increased to 1.8 mg on his follow-up in 03/2014.  No side effects with this dose Also his Amaryl was increased to 6 mg a day Despite this his blood sugars are only minimally better at home and A1c is still high at 7.8 Fasting blood sugars appear to be about the same overall as in the evening on average  He has just seen the dietitian and although he has been trying to do a little better with meal planning he has not lost any weight Not able to exercise much to be effective in weight loss       Oral hypoglycemic drugs the patient is taking are: Janumet 50/1000 twice a day, Amaryl  4 mg a.m. and 2 mg p.m.   Side effects from medications have been: None Compliance with the medical regimen: Fair  Hypoglycemia:   none  Glucose monitoring:  done 1-2 times  a day         Glucometer: One Touch.      Blood Glucose readings by download recently; overall average is for the last 4 weeks  PRE-MEAL Breakfast Lunch Dinner pcs Overall  Glucose range:  150-293    152  158-228   Mean/median:  197/188     200   193/186     Self-care: The diet that the patient has been following is: None, tries to eat more vegetables .           Exercise:  unable to do any         Dietician visit, most recent: 03/2014                Weight history: Previous history: 266-277  Wt Readings from Last 3 Encounters:  05/22/14 262 lb (118.842 kg)  03/20/14 268 lb 9.6 oz (121.836 kg)  03/12/14 268 lb (121.564 kg)    Glycemic control:   Lab Results  Component Value Date   HGBA1C 7.8* 05/19/2014   HGBA1C 9.7* 01/13/2014   HGBA1C 8.7* 08/06/2013   Lab Results  Component Value Date   MICROALBUR 0.9 01/13/2014   LDLCALC 126* 04/08/2013   CREATININE 1.13 03/17/2014         Medication List       This list is accurate as of: 05/22/14 11:59 PM.  Always use your most recent med list.               acetaminophen 325 MG tablet  Commonly known as:  TYLENOL  Take 1-2 tablets (325-650 mg total) by mouth every 4 (four) hours as needed for mild pain.  albuterol 108 (90 BASE) MCG/ACT inhaler  Commonly known as:  PROVENTIL HFA;VENTOLIN HFA  Inhale 2 puffs into the lungs every 6 (six) hours as needed for wheezing or shortness of breath.     aspirin EC 81 MG tablet  Take 81 mg by mouth daily.     atorvastatin 40 MG tablet  Commonly known as:  LIPITOR  Take 1 tablet (40 mg total) by mouth daily.     carbidopa-levodopa 25-100 MG per tablet  Commonly known as:  SINEMET IR  TAKE 1 TABLET BY MOUTH 3 (THREE) TIMES DAILY.     clonazePAM 0.5 MG tablet  Commonly known as:  KLONOPIN  Take 1.5 tablets (0.75 mg total) by mouth at bedtime.     Fluticasone-Salmeterol 250-50 MCG/DOSE Aepb  Commonly known as:  ADVAIR  Inhale 1 puff into the lungs 2 (two) times daily as needed (for copd symptoms).     furosemide 80 MG tablet  Commonly known as:  LASIX  Take 120 mg by mouth daily.     glimepiride 4 MG tablet  Commonly known as:  AMARYL  1 in am and 1/2 at supper     glimepiride 2 MG tablet  Commonly known as:  AMARYL     glucose blood test strip  Commonly known as:  ONE TOUCH ULTRA TEST  Use as instructed to check blood sugar 2 times  per day dx code E11.59     ibuprofen 200 MG tablet  Commonly known as:  ADVIL,MOTRIN  Take 800 mg by mouth daily as needed for pain.     Insulin Glargine 100 UNIT/ML Solostar Pen  Commonly known as:  LANTUS SOLOSTAR  Inject up to 20 units at night     Insulin Pen Needle 32G X 5 MM Misc  Commonly known as:  NOVOTWIST  Use one per day to inject Victoza     KLOR-CON M10 10 MEQ tablet  Generic drug:  potassium chloride  TAKE 3 TABLETS BY MOUTH DAILY     levothyroxine 125 MCG tablet  Commonly known as:  SYNTHROID, LEVOTHROID  TAKE 1 TABLET EVERY DAY DAILY BEFORE BREAKFAST     Liraglutide 18 MG/3ML Sopn  Commonly known as:  VICTOZA  Inject 1.8 mg once a day     metFORMIN 1000 MG tablet  Commonly known as:  GLUCOPHAGE  Take 1 tablet (1,000 mg total) by mouth 2 (two) times daily with a meal.     metoprolol succinate 50 MG 24 hr tablet  Commonly known as:  TOPROL-XL  Take 1 tablet (50 mg total) by mouth at bedtime. Take with or immediately following a meal.     multivitamin with minerals Tabs tablet  Take 1 tablet by mouth daily. Antioxidant  Multi vitamin     nitroGLYCERIN 0.4 MG SL tablet  Commonly known as:  NITROSTAT  Place 1 tablet (0.4 mg total) under the tongue every 5 (five) minutes as needed for chest pain.     ONETOUCH DELICA LANCETS FINE Misc  Check Blood Sugar Daily     pramipexole 0.5 MG tablet  Commonly known as:  MIRAPEX  Take 0.5 mg by mouth 2 (two) times daily.     pramipexole 0.5 MG tablet  Commonly known as:  MIRAPEX  TAKE 1 TABLET THREE TIMES A DAY     spironolactone 25 MG tablet  Commonly known as:  ALDACTONE  Take 1 tablet (25 mg total) by mouth daily.     traMADol 50 MG tablet  Commonly  known as:  ULTRAM  TAKE 1 TABLET TWICE A DAY AS NEED     travoprost (benzalkonium) 0.004 % ophthalmic solution  Commonly known as:  TRAVATAN  Place 1 drop into the right eye at bedtime.     trihexyphenidyl 2 MG tablet  Commonly known as:  ARTANE  Take 2  mg by mouth 2 (two) times daily with a meal.     vitamin B-12 1000 MCG tablet  Commonly known as:  CYANOCOBALAMIN  Take 1,000 mcg by mouth daily.        Allergies:  Allergies  Allergen Reactions  . Penicillins Rash    Because of a history of documented adverse serious drug reaction;Medi Alert bracelet  is recommended    Past Medical History  Diagnosis Date  . Cardiomyopathy   . CAD (coronary artery disease)   . HTN (hypertension)     pt denies 08/19/12  . MI (myocardial infarction)   . Sleep apnea   . GERD (gastroesophageal reflux disease)   . Hyperthyroidism   . Hyperplasia, prostate   . Benign neoplasm of colon   . Nephrolithiasis   . Ventral hernia   . HLD (hyperlipidemia)   . Parkinson disease     1999  . OSA (obstructive sleep apnea)     AHI-28,on CPAP, noncompliant with CPAP  . Sleep apnea, organic   . Complication of anesthesia     pt states that he got a rash  . Arthritis   . Shortness of breath     Hx: of at all times  . UTI (lower urinary tract infection) 09/15/12    Klebsiella  . Asthma   . DM (diabetes mellitus)     TYPE 2     Past Surgical History  Procedure Laterality Date  . Coronary artery bypass graft  2000    [name], MD  . Coronary stent placement  1998  . Median sternotomy  2000  . Deep brain stimulator placement  2004    Right and left VIN stimulator placement   . Acoustic neuroma resection  1981  . Finger amputation      left pointer  . Cataract extraction w/ intraocular lens implant      Hx: of right eye  . Tonsillectomy    . Colonoscopy w/ biopsies and polypectomy      Hx: of  . Cardiac catheterization    . Subthalamic stimulator battery replacement N/A 09/05/2012    Procedure: Deep brain stimulator battery change;  Surgeon: Erline Levine, MD;  Location: [facility];  Service: Neurosurgery;  Laterality: N/A;  Deep brain stimulator battery change  . Left and right heart catheterization with coronary angiogram N/A 09/24/2013     Procedure: LEFT AND RIGHT HEART CATHETERIZATION WITH CORONARY ANGIOGRAM;  Surgeon: Burnell Blanks, MD;  Location: [facility] CATH [facility];  Service: Cardiovascular;  Laterality: N/A;  . Implantable cardioverter defibrillator implant N/A 11/13/2013    Procedure: IMPLANTABLE CARDIOVERTER DEFIBRILLATOR IMPLANT;  Surgeon: Evans Lance, MD;  Location: [facility] CATH LAB;  Service: Cardiovascular;  Laterality: N/A;    Family History  Problem Relation Age of Onset  . Peripheral vascular disease    . Arthritis    . Alcoholism Mother 49    deceased   . Aneurysm Father 46  . Autoimmune disease Brother 24    AIDS  . Diabetes Neg Hx   . Heart disease Neg Hx     Social History:  reports that he has never smoked. He has never  used smokeless tobacco. He reports that he drinks alcohol. He reports that he does not use illicit drugs.    Review of Systems       Vision is normal. Most recent eye exam was 8/15       Lipids: On Lipitor      Lab Results  Component Value Date   CHOL 147 05/19/2014   HDL 35.50* 05/19/2014   LDLCALC 126* 04/08/2013   LDLDIRECT 86.0 05/19/2014   TRIG 213.0* 05/19/2014   CHOLHDL 4 05/19/2014                  Thyroid:  No  unusual fatigue.  He has had hypothyroidism following radioactive iodine treatment for hyperthyroidism in 2013  TSH relatively high and his dose has been adjusted by PCP  Lab Results  Component Value Date   TSH 4.60* 01/13/2014   TSH 10.27* 08/06/2013   TSH 4.29 04/08/2013   FREET4 0.98 11/20/2011   FREET4 0.91 10/16/2011   FREET4 0.29* 09/13/2011       The blood pressure has been only mildly increased but he is taking losartan for this   Diabetic foot exam in 1/16 showed shows normal monofilament sensation in the toes except decreased on the right great toe and plantar surfaces, no skin lesions or ulcers on the feet and normal pedal pulses   LABS:  Lab on 05/19/2014  Component Date Value Ref Range Status  . Glucose, Bld 05/19/2014 142* 70 -  99 mg/dL Final  . Hgb A1c MFr Bld 05/19/2014 7.8* 4.6 - 6.5 % Final   Glycemic Control Guidelines for People with Diabetes:Non Diabetic:  <6%Goal of Therapy: <7%Additional Action Suggested:  >8%   . Cholesterol 05/19/2014 147  0 - 200 mg/dL Final   ATP III Classification       Desirable:  < 200 mg/dL               Borderline High:  200 - 239 mg/dL          High:  > = 240 mg/dL  . Triglycerides 05/19/2014 213.0* 0.0 - 149.0 mg/dL Final   Normal:  <150 mg/dLBorderline High:  150 - 199 mg/dL  . HDL 05/19/2014 35.50* >39.00 mg/dL Final  . VLDL 05/19/2014 42.6* 0.0 - 40.0 mg/dL Final  . Total CHOL/HDL Ratio 05/19/2014 4   Final                  Men          Women1/2 Average Risk     3.4          3.3Average Risk          5.0          4.42X Average Risk          9.6          7.13X Average Risk          15.0          11.0                      . NonHDL 05/19/2014 111.50   Final   NOTE:  Non-HDL goal should be 30 mg/dL higher than patient's LDL goal (i.e. LDL goal of < 70 mg/dL, would have non-HDL goal of < 100 mg/dL)  . Direct LDL 05/19/2014 86.0   Final   Optimal:  <100 mg/dLNear or Above Optimal:  100-129 mg/dLBorderline High:  130-159 mg/dLHigh:  160-189 mg/dLVery  High:  >190 mg/dL    Physical Examination:  BP 122/80 mmHg  Pulse 86  Temp(Src) 98.6 F (37 C) (Oral)  Ht 5\' 9"  (1.753 m)  Wt 262 lb (118.842 kg)  BMI 38.67 kg/m2  SpO2 80%     ASSESSMENT:  Diabetes type 2, uncontrolled with obesity    He appears to be not benefiting from higher doses of Victoza and Amaryl 3/16 Although his A1c is somewhat better than usual at 7.8 his home blood sugars are still averaging about 192 and significantly high fasting Discussed with the patient that he is likely to be insulin deficient now despite using multiple drugs to improve his control He is unable to exercise and this is a limiting factor in his weight loss Diet is fair  PLAN:  He will start taking basal insulin.  Discussed basic  information about what basal insulin is, timing of the dose, duration of action, potential for hypoglycemia, titration based every 3-4 days on fasting blood sugars, fasting blood sugar targets He will need to continue his Amaryl, metformin and Victoza However because of the cost of Victoza will reduce the dose to 1.2 mg as he has difficulty affording the larger dose Consider follow-up with nurse educator for education We will see him back in short-term follow-up for reassessment Encouraged him to be as active as possible   Patient Instructions  LANTUS insulin: This insulin provides blood sugar control for up to 24 hours.  Start with 10 units at bedtime daily and increase by 2 units every 3 days until the waking up sugars are under 130.  Then continue the same dose. If blood sugar is under 90 for 2 days in a row, reduce the dose by 2 units.  Note that this insulin does not control the rise of blood sugar with meals    Victoza 1.2mg  daily  Counseling time on subjects discussed above is over 50% of today's 25 minute visit  Haileyann Staiger 05/25/2014, 7:55 AM   Note: This office note was prepared with Estate agent. Any transcriptional errors that result from this process are unintentional.

## 2014-05-26 ENCOUNTER — Other Ambulatory Visit: Payer: Self-pay | Admitting: Internal Medicine

## 2014-05-28 ENCOUNTER — Telehealth: Payer: Self-pay | Admitting: Internal Medicine

## 2014-05-28 ENCOUNTER — Ambulatory Visit (INDEPENDENT_AMBULATORY_CARE_PROVIDER_SITE_OTHER): Payer: Federal, State, Local not specified - PPO | Admitting: *Deleted

## 2014-05-28 ENCOUNTER — Encounter: Payer: Self-pay | Admitting: Internal Medicine

## 2014-05-28 DIAGNOSIS — I5022 Chronic systolic (congestive) heart failure: Secondary | ICD-10-CM | POA: Diagnosis not present

## 2014-05-28 DIAGNOSIS — I255 Ischemic cardiomyopathy: Secondary | ICD-10-CM

## 2014-05-28 NOTE — Telephone Encounter (Signed)
LMOVM informing pt that his transmission was automatic. Transmission already received.

## 2014-05-28 NOTE — Telephone Encounter (Signed)
°  1. Has your device fired? No  2. Is you device beeping? No  3. Are you experiencing draining or swelling at device site?  No  4. Are you calling to see if we received your device transmission? No  5. Have you passed out? No  Pt calling wanting to know how to send a remote transmission. Please call back and advise.

## 2014-05-28 NOTE — Progress Notes (Signed)
Remote ICD transmission.   

## 2014-05-30 LAB — CUP PACEART REMOTE DEVICE CHECK
Battery Voltage: 3.05 V
Brady Statistic AP VS Percent: 0.14 %
Brady Statistic AS VP Percent: 0.03 %
Brady Statistic AS VS Percent: 99.82 %
Date Time Interrogation Session: 20160519083724
HIGH POWER IMPEDANCE MEASURED VALUE: 74 Ohm
Lead Channel Impedance Value: 380 Ohm
Lead Channel Impedance Value: 456 Ohm
Lead Channel Impedance Value: 494 Ohm
Lead Channel Pacing Threshold Pulse Width: 0.4 ms
Lead Channel Pacing Threshold Pulse Width: 0.4 ms
Lead Channel Sensing Intrinsic Amplitude: 19.375 mV
Lead Channel Sensing Intrinsic Amplitude: 2.25 mV
Lead Channel Sensing Intrinsic Amplitude: 2.25 mV
Lead Channel Setting Pacing Amplitude: 2 V
Lead Channel Setting Sensing Sensitivity: 0.3 mV
MDC IDC MSMT BATTERY REMAINING LONGEVITY: 131 mo
MDC IDC MSMT LEADCHNL RA PACING THRESHOLD AMPLITUDE: 0.875 V
MDC IDC MSMT LEADCHNL RV PACING THRESHOLD AMPLITUDE: 0.375 V
MDC IDC MSMT LEADCHNL RV SENSING INTR AMPL: 19.375 mV
MDC IDC SET LEADCHNL RV PACING AMPLITUDE: 2.5 V
MDC IDC SET LEADCHNL RV PACING PULSEWIDTH: 0.4 ms
MDC IDC SET ZONE DETECTION INTERVAL: 300 ms
MDC IDC SET ZONE DETECTION INTERVAL: 350 ms
MDC IDC STAT BRADY AP VP PERCENT: 0 %
MDC IDC STAT BRADY RA PERCENT PACED: 0.15 %
MDC IDC STAT BRADY RV PERCENT PACED: 0.03 %
Zone Setting Detection Interval: 360 ms
Zone Setting Detection Interval: 450 ms

## 2014-06-10 ENCOUNTER — Encounter: Payer: Self-pay | Admitting: Cardiology

## 2014-06-17 ENCOUNTER — Encounter: Payer: Self-pay | Admitting: Endocrinology

## 2014-06-17 ENCOUNTER — Ambulatory Visit (INDEPENDENT_AMBULATORY_CARE_PROVIDER_SITE_OTHER): Payer: Federal, State, Local not specified - PPO | Admitting: Endocrinology

## 2014-06-17 VITALS — BP 112/62 | HR 85 | Temp 97.9°F | Resp 16 | Ht 69.0 in | Wt 261.6 lb

## 2014-06-17 DIAGNOSIS — E782 Mixed hyperlipidemia: Secondary | ICD-10-CM

## 2014-06-17 DIAGNOSIS — I255 Ischemic cardiomyopathy: Secondary | ICD-10-CM

## 2014-06-17 DIAGNOSIS — E890 Postprocedural hypothyroidism: Secondary | ICD-10-CM

## 2014-06-17 DIAGNOSIS — IMO0002 Reserved for concepts with insufficient information to code with codable children: Secondary | ICD-10-CM

## 2014-06-17 DIAGNOSIS — E1165 Type 2 diabetes mellitus with hyperglycemia: Secondary | ICD-10-CM

## 2014-06-17 NOTE — Patient Instructions (Addendum)
Lantus 22 units at night and keep adjusting to get am sugar <130  Check blood sugars on waking up .Marland Kitchen 4-5 .Marland Kitchen times a week Also check blood sugars about 2 hours after a meal and do this after different meals by rotation  Recommended blood sugar levels on waking up is 90-130 and about 2 hours after meal is 140-180 Please bring blood sugar monitor to each visit.  If sugar < 90 in daytime reduce amaryl to 1 in am

## 2014-06-17 NOTE — Progress Notes (Signed)
Patient ID: Henry [name], male   DOB: [birth year], 64 y.o.   MRN: [id number]           Reason for Appointment: Follow-up for Type 2 Diabetes  Referring physician: Linna Darner  History of Present Illness:          Diagnosis: Type 2 diabetes mellitus, date of diagnosis: 2000      Past history:   Patient thinks he has been taking Amaryl for several years and probably metformin since onset also At some point he was changed from metformin to [facility] and was taking this since at least 2012 A1c had been higher in 2015 Since his A1c had been progressively higher with his regimen of Janumet and Amaryl he was started on Victoza in 01/2014  Recent history:  Victoza was increased to 1.8 mg on his follow-up in 03/2014.  No side effects with this dose Also his Amaryl was increased to 6 mg a day Despite 1.8 mg Victoza and also increased dose of Amaryl his blood sugars were only minimally better at home  His A1c was high at 7.8 His weight has also been difficult to improve He was started on Lantus insulin and 5/16 because of persistent hyperglycemia especially fasting; was having readings as high as 293  With starting Lantus and gradually increasing the dose every 3 days his fasting blood sugars appear to be improving but still over 130 target, has been on 20 units for the last 3 days now  He has  seen the dietitian in 3/16 Not able to exercise much to be effective in weight loss       Oral hypoglycemic drugs the patient is taking are: Janumet 50/1000 twice a day, Amaryl  4 mg a.m. and 2 mg p.m.   Side effects from medications have been: None Compliance with the medical regimen: Fair  Hypoglycemia:   none  Glucose monitoring:  done 1-2 times  a day         Glucometer: One Touch.      Blood Glucose readings by download recently; overall average is for the last 4 weeks  Mean values apply above for all meters except median for One Touch  PRE-MEAL Fasting Lunch Dinner Bedtime Overall  Glucose  range: 134-200   145-256    149, 228    Mean/median:  182      182     Self-care: The diet that the patient has been following is:  tries to eat more vegetables .           Exercise: unable to do any         Dietician visit, most recent: 03/2014                Weight history: Previous history: 266-277  Wt Readings from Last 3 Encounters:  06/17/14 261 lb 9.6 oz (118.661 kg)  05/22/14 262 lb (118.842 kg)  03/20/14 268 lb 9.6 oz (121.836 kg)    Glycemic control:   Lab Results  Component Value Date   HGBA1C 7.8* 05/19/2014   HGBA1C 9.7* 01/13/2014   HGBA1C 8.7* 08/06/2013   Lab Results  Component Value Date   MICROALBUR 0.9 01/13/2014   LDLCALC 126* 04/08/2013   CREATININE 1.13 03/17/2014         Medication List       This list is accurate as of: 06/17/14  3:40 PM.  Always use your most recent med list.  acetaminophen 325 MG tablet  Commonly known as:  TYLENOL  Take 1-2 tablets (325-650 mg total) by mouth every 4 (four) hours as needed for mild pain.     albuterol 108 (90 BASE) MCG/ACT inhaler  Commonly known as:  PROVENTIL HFA;VENTOLIN HFA  Inhale 2 puffs into the lungs every 6 (six) hours as needed for wheezing or shortness of breath.     aspirin EC 81 MG tablet  Take 81 mg by mouth daily.     atorvastatin 40 MG tablet  Commonly known as:  LIPITOR  Take 1 tablet (40 mg total) by mouth daily.     carbidopa-levodopa 25-100 MG per tablet  Commonly known as:  SINEMET IR  TAKE 1 TABLET BY MOUTH 3 (THREE) TIMES DAILY.     clonazePAM 0.5 MG tablet  Commonly known as:  KLONOPIN  Take 1.5 tablets (0.75 mg total) by mouth at bedtime.     Fluticasone-Salmeterol 250-50 MCG/DOSE Aepb  Commonly known as:  ADVAIR  Inhale 1 puff into the lungs 2 (two) times daily as needed (for copd symptoms).     furosemide 80 MG tablet  Commonly known as:  LASIX  Take 120 mg by mouth daily.     glimepiride 4 MG tablet  Commonly known as:  AMARYL  1 in am and 1/2  at supper     glimepiride 2 MG tablet  Commonly known as:  AMARYL  Take 2 mg by mouth. 2 tablets in am and 1 tablet in pm     glucose blood test strip  Commonly known as:  ONE TOUCH ULTRA TEST  Use as instructed to check blood sugar 2 times per day dx code E11.59     ibuprofen 200 MG tablet  Commonly known as:  ADVIL,MOTRIN  Take 800 mg by mouth daily as needed for pain.     Insulin Glargine 100 UNIT/ML Solostar Pen  Commonly known as:  LANTUS SOLOSTAR  Inject up to 20 units at night     Insulin Pen Needle 32G X 5 MM Misc  Commonly known as:  NOVOTWIST  Use one per day to inject Victoza     KLOR-CON M10 10 MEQ tablet  Generic drug:  potassium chloride  TAKE 3 TABLETS BY MOUTH DAILY     levothyroxine 125 MCG tablet  Commonly known as:  SYNTHROID, LEVOTHROID  TAKE 1 TABLET EVERY DAY DAILY BEFORE BREAKFAST     Liraglutide 18 MG/3ML Sopn  Commonly known as:  VICTOZA  Inject 1.8 mg once a day     metFORMIN 1000 MG tablet  Commonly known as:  GLUCOPHAGE  Take 1 tablet (1,000 mg total) by mouth 2 (two) times daily with a meal.     metoprolol succinate 50 MG 24 hr tablet  Commonly known as:  TOPROL-XL  Take 1 tablet (50 mg total) by mouth at bedtime. Take with or immediately following a meal.     multivitamin with minerals Tabs tablet  Take 1 tablet by mouth daily. Antioxidant  Multi vitamin     nitroGLYCERIN 0.4 MG SL tablet  Commonly known as:  NITROSTAT  Place 1 tablet (0.4 mg total) under the tongue every 5 (five) minutes as needed for chest pain.     ONETOUCH DELICA LANCETS FINE Misc  Check Blood Sugar Daily     pramipexole 0.5 MG tablet  Commonly known as:  MIRAPEX  Take 0.5 mg by mouth 2 (two) times daily.     spironolactone 25 MG tablet  Commonly known as:  ALDACTONE  Take 1 tablet (25 mg total) by mouth daily.     traMADol 50 MG tablet  Commonly known as:  ULTRAM  TAKE 1 TABLET TWICE A DAY AS NEED     travoprost (benzalkonium) 0.004 % ophthalmic  solution  Commonly known as:  TRAVATAN  Place 1 drop into the right eye at bedtime.     trihexyphenidyl 2 MG tablet  Commonly known as:  ARTANE  Take 2 mg by mouth 2 (two) times daily with a meal.     vitamin B-12 1000 MCG tablet  Commonly known as:  CYANOCOBALAMIN  Take 1,000 mcg by mouth daily.        Allergies:  Allergies  Allergen Reactions  . Penicillins Rash    Because of a history of documented adverse serious drug reaction;Medi Alert bracelet  is recommended    Past Medical History  Diagnosis Date  . Cardiomyopathy   . CAD (coronary artery disease)   . HTN (hypertension)     pt denies 08/19/12  . MI (myocardial infarction)   . Sleep apnea   . GERD (gastroesophageal reflux disease)   . Hyperthyroidism   . Hyperplasia, prostate   . Benign neoplasm of colon   . Nephrolithiasis   . Ventral hernia   . HLD (hyperlipidemia)   . Parkinson disease     1999  . OSA (obstructive sleep apnea)     AHI-28,on CPAP, noncompliant with CPAP  . Sleep apnea, organic   . Complication of anesthesia     pt states that he got a rash  . Arthritis   . Shortness of breath     Hx: of at all times  . UTI (lower urinary tract infection) 09/15/12    Klebsiella  . Asthma   . DM (diabetes mellitus)     TYPE 2     Past Surgical History  Procedure Laterality Date  . Coronary artery bypass graft  2000    [name], MD  . Coronary stent placement  1998  . Median sternotomy  2000  . Deep brain stimulator placement  2004    Right and left VIN stimulator placement   . Acoustic neuroma resection  1981  . Finger amputation      left pointer  . Cataract extraction w/ intraocular lens implant      Hx: of right eye  . Tonsillectomy    . Colonoscopy w/ biopsies and polypectomy      Hx: of  . Cardiac catheterization    . Subthalamic stimulator battery replacement N/A 09/05/2012    Procedure: Deep brain stimulator battery change;  Surgeon: Erline Levine, MD;  Location: [facility];   Service: Neurosurgery;  Laterality: N/A;  Deep brain stimulator battery change  . Left and right heart catheterization with coronary angiogram N/A 09/24/2013    Procedure: LEFT AND RIGHT HEART CATHETERIZATION WITH CORONARY ANGIOGRAM;  Surgeon: Burnell Blanks, MD;  Location[facility] C[facility] LAB;  Service: Cardiovascular;  Laterality: N/A;  . Implantable cardioverter defibrillator implant N/A 11/13/2013    Procedure: IMPLANTABLE CARDIOVERTER DEFIBRILLATOR IMPLANT;  Surgeon: Evans Lance, MD;  Location: [facility] CATH LAB;  Service: Cardiovascular;  Laterality: N/A;    Family History  Problem Relation Age of Onset  . Peripheral vascular disease    . Arthritis    . Alcoholism Mother 36    deceased   . Aneurysm Father 39  . Autoimmune disease Brother 8    AIDS  . Diabetes  Neg Hx   . Heart disease Neg Hx     Social History:  reports that he has never smoked. He has never used smokeless tobacco. He reports that he drinks alcohol. He reports that he does not use illicit drugs.    Review of Systems     Most recent eye exam was 8/15       Lipids: On Lipitor, tends to have high triglycerides      Lab Results  Component Value Date   CHOL 147 05/19/2014   HDL 35.50* 05/19/2014   LDLCALC 126* 04/08/2013   LDLDIRECT 86.0 05/19/2014   TRIG 213.0* 05/19/2014   CHOLHDL 4 05/19/2014                  Thyroid:  No  unusual fatigue.  He has had hypothyroidism following radioactive iodine treatment for hyperthyroidism in 2013  TSH relatively high and has not been scheduled for follow-up by PCP  Lab Results  Component Value Date   TSH 4.60* 01/13/2014   TSH 10.27* 08/06/2013   TSH 4.29 04/08/2013   FREET4 0.98 11/20/2011   FREET4 0.91 10/16/2011   FREET4 0.29* 09/13/2011       The blood pressure has been only mildly increased but he is taking losartan for this   Diabetic foot exam in 1/16 showed shows normal monofilament sensation in the toes except decreased on the right great toe and  plantar surfaces, no skin lesions or ulcers on the feet and normal pedal pulses    Physical Examination:  BP 112/62 mmHg  Pulse 85  Temp(Src) 97.9 F (36.6 C)  Resp 16  Ht 5\' 9"  (1.753 m)  Wt 261 lb 9.6 oz (118.661 kg)  BMI 38.61 kg/m2  SpO2 95%     ASSESSMENT:  Diabetes type 2, uncontrolled with obesity    He appears to be getting improvement in his blood sugars finally with adding Lantus to his regimen of Victoza, metformin and Amaryl Victoza was reduced to 1.2 mg for economy However with 20 units his fasting blood sugars are still not quite at target and are mostly in the mid 100 range Also has not checked readings after meals and not clear if he still has some postprandial hyperglycemia He is unable to exercise and this is a limiting factor in his weight loss   PLAN:  He will increase his Lantus by at least 2 units now  He needs to start checking more readings after meals  To check thyroid levels on his next visit  Continue Victoza 1.2 mg daily   Patient Instructions  Lantus 22 units at night and keep adjusting to get am sugar <130  Check blood sugars on waking up .Marland Kitchen 4-5 .Marland Kitchen times a week Also check blood sugars about 2 hours after a meal and do this after different meals by rotation  Recommended blood sugar levels on waking up is 90-130 and about 2 hours after meal is 140-180 Please bring blood sugar monitor to each visit.  If sugar < 90 in daytime reduce amaryl to 1 in am     Counseling time on subjects discussed above is over 50% of today's 25 minute visit  Pammy Vesey 06/17/2014, 3:40 PM   Note: This office note was prepared with Estate agent. Any transcriptional errors that result from this process are unintentional.

## 2014-06-25 ENCOUNTER — Ambulatory Visit: Payer: Medicare Other | Admitting: Neurology

## 2014-06-29 ENCOUNTER — Other Ambulatory Visit: Payer: Self-pay | Admitting: Neurology

## 2014-06-29 NOTE — Telephone Encounter (Signed)
Patient's last appointment was in February.  His last refill was for #135 pills.  Ok to refill?

## 2014-06-29 NOTE — Telephone Encounter (Signed)
Rx faxed

## 2014-06-30 ENCOUNTER — Other Ambulatory Visit: Payer: Self-pay | Admitting: Emergency Medicine

## 2014-06-30 ENCOUNTER — Telehealth: Payer: Self-pay | Admitting: Emergency Medicine

## 2014-06-30 MED ORDER — TRAMADOL HCL 50 MG PO TABS: ORAL_TABLET | ORAL | Status: DC

## 2014-06-30 NOTE — Telephone Encounter (Signed)
RX refill from pharm for Tramadol. Last OV 01/2014

## 2014-06-30 NOTE — Telephone Encounter (Signed)
OK x1  My retirement date is 01/09/2015; but I will be in office only T,Th & Weds Oct-Dec.You should transition your care to another PCP by Oct 1,2016.

## 2014-07-01 ENCOUNTER — Ambulatory Visit (INDEPENDENT_AMBULATORY_CARE_PROVIDER_SITE_OTHER): Payer: Federal, State, Local not specified - PPO | Admitting: Neurology

## 2014-07-01 ENCOUNTER — Encounter: Payer: Self-pay | Admitting: Neurology

## 2014-07-01 VITALS — BP 120/74 | HR 83 | Ht 68.0 in | Wt 264.0 lb

## 2014-07-01 DIAGNOSIS — G4752 REM sleep behavior disorder: Secondary | ICD-10-CM | POA: Diagnosis not present

## 2014-07-01 DIAGNOSIS — G20 Parkinson's disease: Secondary | ICD-10-CM

## 2014-07-01 DIAGNOSIS — IMO0002 Reserved for concepts with insufficient information to code with codable children: Secondary | ICD-10-CM

## 2014-07-01 DIAGNOSIS — I255 Ischemic cardiomyopathy: Secondary | ICD-10-CM

## 2014-07-01 DIAGNOSIS — E1165 Type 2 diabetes mellitus with hyperglycemia: Secondary | ICD-10-CM

## 2014-07-01 NOTE — Procedures (Signed)
DBS Programming was performed.    Total time spent programming was 30 minutes.  Device was confirmed to be on.  Soft start was confirmed to be on.  Impedences were checked and were within normal limits.  Battery was checked and was determined to be functioning normally and not near the end of life.  Detailed analysis on separate neurophysiologic worksheet.    Final settings were as follows:  Left brain electrode:     1-2+           ; Amplitude  4.0   V   ; Pulse width 90 microseconds;   Frequency   160   Hz.  Right brain electrode:     4-7+          ; Amplitude   4.2  V ;  Pulse width 90  microseconds;  Frequency   160    Hz.

## 2014-07-01 NOTE — Progress Notes (Signed)
Henry [name] was seen today in the movement disorders clinic for neurologic consultation at the request of [name].  His PCP is [name], MD.  The consultation is for the evaluation of PD and to manage his DBS.  He is accompanied by his wife who supplements the history.  The first symptom(s) the patient noticed was right hand tremor in 1999.  He was seen by neurology and was dx with PD.  He was placed on something, but it caused sleepiness.  He does not think that he has ever been on levodopa.   He was placed on Mirapex, which seemed to help.  He only takes it twice per day.  He didn't think it made a difference when he took it tid.  He began to have tremor and it was suggested he do DBS.    The pt is s/p stn DBS in 2005.  He had a battery change in 2009.    10/02/12 update: The patient is accompanied today by his daughter, who supplements the history.  I reviewed medical records available to me since last visit.  The patient had his generator changed on 09/05/2012.  He remains on pramipexole, 0.5 mg twice per day.  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 [name] in that regard.  He is not compliant with CPAP so says that he doesn't want to return to her for f/u. He doesn't take the klonopin faithfully.  10/11/12 update:  Pt was seen as a walk in/work in today.  Pt had increasing tremor, L greater than R for 3 days.  Thinks that it started with the d/c of artane.  Speech stable.  11/19/12 update:  Pt is seen today for his PD, accompanied by his daughter who supplements the hx.    He is currently on klonopin 0.5 mg - 1/2-1 tablet q hs.  He only takes it when his wife is not working.  She works nights and is only in 2 days per week.  He has some reluctance to take it other nights.  He is on pramipexole 0.5 mg bid.  Last visit, his DBS was reset more similar to the settings he had prior to coming here, just with an increased voltage.  About 2 weeks after our last  visit, the patient decided to go back on the Artane.  The combination of the Artane and the DBS changes helped significantly.  He does ask if I can slightly increased voltage on the left hand, as he has some tremor at night that is bothersome.  Otherwise, he is doing well.  He feels that his balance has been great.  02/19/13 update:  This patient is accompanied in the office by his child who supplements the history.  The pt has a hx of PD.  He has been tremor free and is very happy about that.  He has had some increased balance loss; the last fall was a few months ago but he hasn't gotten hurt.  Was considering hernia surgery.  The records that were made available to me were reviewed.  He is holding on that for now but plans to have it done before end of year.  No hallucinations.  He is still having some acting out of the dreams, despite clonazepam.  06/19/13 update:  Patient is returning to followup regarding his Parkinson's disease.  I had the opportunity to review records since last visit.  He went to the emergency room on 05/12/2013  with chest pain and shortness of breath.  He had been fishing and had missed several doses of Lasix.  He had been eating seafood.  He ended up with an acute exacerbation of his chronic congestive heart failure.  Once he was diuresed and back on his medications, he is feeling better.  He just had a nuclear medicine study done on 06/17/2013 and the ejection fraction on this looked better than on his echocardiogram.  The left ventricular ejection fraction was 41%.  Prior to that, there was some concern that his left ventricular ejection fraction had dropped so much that he may need an ICD.  In terms of Parkinson's disease, the patient states that he has been doing very well in terms of tremor, but his walking really has deteriorated.  He describes a festinating gait, with much more shuffling.  He fell walking over his dog gate but otherwise has not had falls.  He went fishing  yesterday and states that he "stumbled all day long."  No hallucinations.  He has been trying to be more faithful with his CPAP. He remains on Mirapex 0.5 mg twice a day, Artane 2 mg twice a day.  10/23/13 update:  The patient returns today to the clinic, accompanied by his wife who supplements the history.  From a Parkinson's standpoint, the patient states that he has been doing very well.  No tremor.  He started on levodopa last visit and states that he has had no falls and overall stumbling has been much better.  He states that the only time that he stumbles is if he is inside of his fishing boat, and states that that is really because it is small and unstable.  He is taking his levodopa in the morning, after lunch and bedtime.  He has had no hallucinations.  His wife states that he is still draining and acting out the dreams and even fell out of bed a few days ago.  However, he is taking his clonazepam about 10 PM but doesn't go to bed until 1 AM.  He is not using his CPAP.  Is having significant constipation.   I reviewed his cardiology records since last visit.  He did have a heart catheterization and is scheduled to have an ICD placed on November 5.  02/23/14 update:  Pt returns for follow up accompanied by his wife who supplements the history.  The records that were made available to me were reviewed since last visit.  He had an ICD placed on 11/15/13.  He is doing well.  Taking carbidopa/levodopa 25/100 three times a day and remains on artane as well as mirapex 0.5 mg bid.  Had a fall up the stairs.  Wife describes festinating gate and pt thinks that levodopa contributes.  He also doesn't feel good much of the time and thinks that is from levodopa.  Admits, however, that when BS under good control, he feels good.  Has started seeing endocrinology and started on new meds.  He has klonopin for RBD.  Still vivid dreams but no falling out bed (in recliner now).  Having some word finding trouble and memory  doesn't seem clear.  Also, put back tailgate down on car and came down on generator and wants me to look at that.    07/01/14 update:  The patient follows up today regarding his Parkinson's disease.  He is accompanied by his wife who supplements the history.  Last visit, the patient was complaining about memory change which I  thought was likely multifactorial and due to medication such as Artane, Mirapex and Ultram, which he was taking every 3 hours.  The patient, however, thought that it was from levodopa and so we held it and the patient reported that he felt better and therefore continued to stay off of the medication.  He reports that falls are better after d/c levodopa but wife reports still falls.  Pt states that he only fell in shower after he d/c levodopa.  His wife does admit that she thinks that levodopa was causing loss of balance.  He is having more tremor over the last month on the L hand.   He remains on clonazepam 0.5 mg, 1-1/2 tablets at night for REM behavior disorder.  I reviewed records since last visit.  He has seen Dr. Dwyane Dee multiple times in regards to his uncontrolled diabetes.  He started on insulin on May 22 2014.  He states that he is doing well with injecting himself.  Having a lot of constipation.  Has the rancho recipe.     Neuroimaging has  previously been performed.  It is available for my review today.  This was done in February, 2005 in preparation for DBS.  The examination was essentially unremarkable.  The formal report is below:  Clinical Data: Parkinson's disease.  MRI BRAIN WITHOUT AND WITH CONTRAST  Technique: Stealth protocol.  Multiplanar T1- weighted images were obtained before and after the administration of 20 cc of Omniscan. 5.0, 3.0, and ultimately 1.3 mm thick images were obtained through the brain with special attention to the basal ganglia and deep commissures to allow for stereotactic planning and treatment of Parkinson's disease. There are no discrete or  focal lesions demonstrated on these limited pulse sequences, which do not include a diffusion or T2- weighted pulse sequence. FLAIR images were obtained and show no significant small vessel disease or ventriculomegaly.  IMPRESSION  MRI of the brain without and with contrast performed according to Stealth protocol showing no obvious focal or enhancing lesion.   PREVIOUS MEDICATIONS: Mirapex and artane  ALLERGIES:   Allergies  Allergen Reactions  . Penicillins Rash    Because of a history of documented adverse serious drug reaction;Medi Alert bracelet  is recommended    CURRENT MEDICATIONS:     Medication List       This list is accurate as of: 07/01/14  2:11 PM.  Always use your most recent med list.               acetaminophen 325 MG tablet  Commonly known as:  TYLENOL  Take 1-2 tablets (325-650 mg total) by mouth every 4 (four) hours as needed for mild pain.     albuterol 108 (90 BASE) MCG/ACT inhaler  Commonly known as:  PROVENTIL HFA;VENTOLIN HFA  Inhale 2 puffs into the lungs every 6 (six) hours as needed for wheezing or shortness of breath.     aspirin EC 81 MG tablet  Take 81 mg by mouth daily.     atorvastatin 40 MG tablet  Commonly known as:  LIPITOR  Take 1 tablet (40 mg total) by mouth daily.     carbidopa-levodopa 25-100 MG per tablet  Commonly known as:  SINEMET IR  TAKE 1 TABLET BY MOUTH 3 (THREE) TIMES DAILY.     clonazePAM 0.5 MG tablet  Commonly known as:  KLONOPIN  TAKE 1 & 1/2 TABLETS AT BEDTIME     Fluticasone-Salmeterol 250-50 MCG/DOSE Aepb  Commonly known as:  ADVAIR  Inhale 1 puff into the lungs 2 (two) times daily as needed (for copd symptoms).     furosemide 80 MG tablet  Commonly known as:  LASIX  Take 120 mg by mouth daily.     glimepiride 4 MG tablet  Commonly known as:  AMARYL  1 in am and 1/2 at supper     glimepiride 2 MG tablet  Commonly known as:  AMARYL  Take 2 mg by mouth. 2 tablets in am and 1 tablet in pm     glucose  blood test strip  Commonly known as:  ONE TOUCH ULTRA TEST  Use as instructed to check blood sugar 2 times per day dx code E11.59     ibuprofen 200 MG tablet  Commonly known as:  ADVIL,MOTRIN  Take 800 mg by mouth daily as needed for pain.     Insulin Glargine 100 UNIT/ML Solostar Pen  Commonly known as:  LANTUS SOLOSTAR  Inject up to 20 units at night     Insulin Pen Needle 32G X 5 MM Misc  Commonly known as:  NOVOTWIST  Use one per day to inject Victoza     KLOR-CON M10 10 MEQ tablet  Generic drug:  potassium chloride  TAKE 3 TABLETS BY MOUTH DAILY     levothyroxine 125 MCG tablet  Commonly known as:  SYNTHROID, LEVOTHROID  TAKE 1 TABLET EVERY DAY DAILY BEFORE BREAKFAST     Liraglutide 18 MG/3ML Sopn  Commonly known as:  VICTOZA  Inject 1.8 mg once a day     metFORMIN 1000 MG tablet  Commonly known as:  GLUCOPHAGE  Take 1 tablet (1,000 mg total) by mouth 2 (two) times daily with a meal.     metoprolol succinate 50 MG 24 hr tablet  Commonly known as:  TOPROL-XL  Take 1 tablet (50 mg total) by mouth at bedtime. Take with or immediately following a meal.     multivitamin with minerals Tabs tablet  Take 1 tablet by mouth daily. Antioxidant  Multi vitamin     nitroGLYCERIN 0.4 MG SL tablet  Commonly known as:  NITROSTAT  Place 1 tablet (0.4 mg total) under the tongue every 5 (five) minutes as needed for chest pain.     ONETOUCH DELICA LANCETS FINE Misc  Check Blood Sugar Daily     pramipexole 0.5 MG tablet  Commonly known as:  MIRAPEX  Take 0.5 mg by mouth 2 (two) times daily.     spironolactone 25 MG tablet  Commonly known as:  ALDACTONE  Take 1 tablet (25 mg total) by mouth daily.     traMADol 50 MG tablet  Commonly known as:  ULTRAM  TAKE 1 TABLET TWICE A DAY AS NEED     travoprost (benzalkonium) 0.004 % ophthalmic solution  Commonly known as:  TRAVATAN  Place 1 drop into the right eye at bedtime.     trihexyphenidyl 2 MG tablet  Commonly known as:   ARTANE  Take 2 mg by mouth 2 (two) times daily with a meal.     vitamin B-12 1000 MCG tablet  Commonly known as:  CYANOCOBALAMIN  Take 1,000 mcg by mouth daily.         PAST MEDICAL HISTORY:   Past Medical History  Diagnosis Date  . Cardiomyopathy   . CAD (coronary artery disease)   . HTN (hypertension)     pt denies 08/19/12  . MI (myocardial infarction)   . Sleep apnea   . GERD (gastroesophageal reflux  disease)   . Hyperthyroidism   . Hyperplasia, prostate   . Benign neoplasm of colon   . Nephrolithiasis   . Ventral hernia   . HLD (hyperlipidemia)   . Parkinson disease     1999  . OSA (obstructive sleep apnea)     AHI-28,on CPAP, noncompliant with CPAP  . Sleep apnea, organic   . Complication of anesthesia     pt states that he got a rash  . Arthritis   . Shortness of breath     Hx: of at all times  . UTI (lower urinary tract infection) 09/15/12    Klebsiella  . Asthma   . DM (diabetes mellitus)     TYPE 2     PAST SURGICAL HISTORY:   Past Surgical History  Procedure Laterality Date  . Coronary artery bypass graft  2000    [name], MD  . Coronary stent placement  1998  . Median sternotomy  2000  . Deep brain stimulator placement  2004    Right and left VIN stimulator placement   . Acoustic neuroma resection  1981  . Finger amputation      left pointer  . Cataract extraction w/ intraocular lens implant      Hx: of right eye  . Tonsillectomy    . Colonoscopy w/ biopsies and polypectomy      Hx: of  . Cardiac catheterization    . Subthalamic stimulator battery replacement N/A 09/05/2012    Procedure: Deep brain stimulator battery change;  Surgeon: Erline Levine, MD;  Location: [facility];  Service: Neurosurgery;  Laterality: N/A;  Deep brain stimulator battery change  . Left and right heart catheterization with coronary angiogram N/A 09/24/2013    Procedure: LEFT AND RIGHT HEART CATHETERIZATION WITH CORONARY ANGIOGRAM;  Surgeon: Burnell Blanks, MD;  Location: [facility] CATH LAB;  Service: Cardiovascular;  Laterality: N/A;  . Implantable cardioverter defibrillator implant N/A 11/13/2013    Procedure: IMPLANTABLE CARDIOVERTER DEFIBRILLATOR IMPLANT;  Surgeon: Evans Lance, MD;  Location: [facility] CATH LAB;  Service: Cardiovascular;  Laterality: N/A;    SOCIAL HISTORY:   History   Social History  . Marital Status: Married    Spouse Name: [name]  . Number of Children: 2  . Years of Education: N/A   Occupational History  . 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 Smoking status: Never Smoker   . Smokeless tobacco: Never Used  . Alcohol Use: Yes     Comment: occasional wine  . Drug Use: No  . Sexual Activity: Not on file   Other Topics Concern  . Not on file   Social History Narrative    FAMILY HISTORY:   Family Status  Relation Status Death Age  . Mother Deceased     complications of surgery  . Father Deceased     EtOHism  . Sister Alive     2 full, one half; PVD  . Brother Deceased     AIDS  . Child Alive     3, alive and well    ROS:  A complete 10 system review of systems was obtained and was unremarkable apart from what is mentioned above.  PHYSICAL EXAMINATION:    VITALS:   Filed Vitals:   07/01/14 1358  BP: 120/74  Pulse: 83  Height: 5\' 8"  (1.727 m)  Weight: 264 lb (119.75 kg)  SpO2: 98%    GEN:  The patient appears  stated age and is in NAD. HEENT:  Normocephalic, atraumatic.  The mucous membranes are moist. The superficial temporal arteries are without ropiness or tenderness. CV:  RRR Lungs:  CTAB Neck/HEME:  There are no carotid bruits bilaterally. Skin:  There is a small scab over the right generator where he hit it with car tailgate.  No streaking, erythema, tenderness, crepitance or heat coming from area.  Neurological examination:  Orientation: The patient is alert and oriented x3. Fund of knowledge is appropriate.  Recent and remote memory are intact.   Attention and concentration are normal.    Able to name objects and repeat phrases. Cranial nerves: There is good facial symmetry. Pupils are equal round and reactive to light bilaterally. Fundoscopic exam reveals clear margins bilaterally. Extraocular muscles are intact. The visual fields are full to confrontational testing. The speech is fluent and just mildly dysarthric.  He is hypophonic. Soft palate rises symmetrically and there is no tongue deviation. Hearing is intact to conversational tone. Motor: Strength is 5/5 in the bilateral upper and lower extremities.   Shoulder shrug is equal and symmetric.  There is no pronator drift.  Movement examination: Tone: There is normal tone in the UE and LE bilaterally Abnormal movements: There is an intermittent left upper extremity resting tremor Coordination:  There is no decremation with RAM's today Gait and Station: The patient has minimal difficulty arising out of a deep-seated chair without the use of the hands. The patient's stride length is decreased and he is shuffling some   LABS  Lab Results  Component Value Date   WBC 6.9 11/10/2013   HGB 12.8* 11/10/2013   HCT 39.5 11/10/2013   MCV 87.4 11/10/2013   PLT 152.0 11/10/2013   Lab Results  Component Value Date   TSH 4.60* 01/13/2014     Chemistry      Component Value Date/Time   NA 138 03/17/2014 1305   K 4.1 03/17/2014 1305   CL 98 03/17/2014 1305   CO2 33* 03/17/2014 1305   BUN 18 03/17/2014 1305   CREATININE 1.13 03/17/2014 1305   CREATININE 1.04 06/05/2013 1020      Component Value Date/Time   CALCIUM 9.0 03/17/2014 1305   ALKPHOS 59 04/08/2013 0937   AST 18 04/08/2013 0937   ALT 34 04/08/2013 0937   BILITOT 0.7 04/08/2013 0937     Lab Results  Component Value Date   VITAMINB12 268 08/19/2012   Lab Results  Component Value Date   HGBA1C 7.8* 05/19/2014   DBS programming was performed today, which is described in more detail on a separate programming procedural  notes.  In short, there was resolution of the left upper extremity tremor after programming  ASSESSMENT/PLAN:  1.  Idiopathic Parkinson's disease.    -I again talked to the patient about the Artane, which he is taking 2 mg twice a day.  I expressed to him that this can cause loss of balance and confusion.  However, when we tried to stop it in the past, he was not able to tolerate tremor.  I did adjust his DBS today so perhaps that will be better and perhaps we can try to wean it again in the future. -The patient will continue on pramipexole, 0.5 mg twice a day.         -I reviewed with the patient and his wife how to use his DBS programmer as they had both forgotten. 2.  RBD  -He will continue his clonazepam 0.5 mg, to  1-1/2 tablets per night .  His wife reports that the increase definitely helped. 3.  OSAS, noncompliant with CPAP.  -Unfortunately, he has been unable to tolerate CPAP 4.  Sialorrhea  -This is associated with Parkinson's disease.  He may benefit from Myobloc in the future. 5.  Dysphagia.  -Overall, this is fairly mild but I will give an eye on this and consider a MBE if needed. 6.  CHF  -ICD placed on 11/15/2013 7.  Constipation  -copy of rancho recipe given although he still has the problem.  -asks me for referral for his screening colonoscopy.  Been over 10 years.  Last saw Dr. Maurene Capes.  Will refer 8.  Uncontrolled DM  -Doing somewhat better now that he has started on insulin. 9.  Follow-up in the next few months, sooner should new neurologic issues arise.

## 2014-07-03 ENCOUNTER — Encounter: Payer: Self-pay | Admitting: Internal Medicine

## 2014-07-06 ENCOUNTER — Other Ambulatory Visit: Payer: Self-pay

## 2014-07-13 ENCOUNTER — Other Ambulatory Visit: Payer: Self-pay | Admitting: Endocrinology

## 2014-07-17 ENCOUNTER — Other Ambulatory Visit: Payer: Self-pay | Admitting: Endocrinology

## 2014-07-17 ENCOUNTER — Telehealth: Payer: Self-pay | Admitting: Endocrinology

## 2014-07-17 NOTE — Telephone Encounter (Signed)
Please call pt back regarding medication change and PA call [phone]

## 2014-07-17 NOTE — Telephone Encounter (Signed)
Left message for patient to call back  

## 2014-07-22 ENCOUNTER — Other Ambulatory Visit: Payer: Self-pay | Admitting: *Deleted

## 2014-07-22 ENCOUNTER — Telehealth: Payer: Self-pay | Admitting: Endocrinology

## 2014-07-22 MED ORDER — GLIMEPIRIDE 4 MG PO TABS: ORAL_TABLET | ORAL | Status: DC

## 2014-07-22 NOTE — Telephone Encounter (Signed)
Pt needs a refill glimepiride, pt requesting a phone call back [phone]

## 2014-07-22 NOTE — Telephone Encounter (Signed)
Pharmacy calling for updated dosing glimepiride to 4 mg 2 daily

## 2014-07-22 NOTE — Telephone Encounter (Signed)
rx sent, left message for patient

## 2014-07-23 ENCOUNTER — Other Ambulatory Visit: Payer: Self-pay | Admitting: Cardiology

## 2014-07-24 ENCOUNTER — Other Ambulatory Visit: Payer: Self-pay

## 2014-07-24 NOTE — Telephone Encounter (Signed)
Patient called in stating that he is out of tramadol and need a refill before the weekend. Per MD ok to refill for #30/0rf. Rx called in, pt notified and advised that tramadol is temporary and not to be used chronically.

## 2014-07-26 ENCOUNTER — Other Ambulatory Visit: Payer: Self-pay | Admitting: Cardiology

## 2014-07-27 ENCOUNTER — Other Ambulatory Visit: Payer: Self-pay | Admitting: *Deleted

## 2014-07-27 ENCOUNTER — Other Ambulatory Visit: Payer: Self-pay

## 2014-07-27 DIAGNOSIS — I255 Ischemic cardiomyopathy: Secondary | ICD-10-CM

## 2014-07-27 MED ORDER — SPIRONOLACTONE 25 MG PO TABS: 25.0000 mg | ORAL_TABLET | Freq: Every day | ORAL | Status: DC

## 2014-08-02 ENCOUNTER — Other Ambulatory Visit: Payer: Self-pay | Admitting: Internal Medicine

## 2014-08-03 ENCOUNTER — Other Ambulatory Visit: Payer: Self-pay | Admitting: Emergency Medicine

## 2014-08-03 MED ORDER — FUROSEMIDE 80 MG PO TABS: 120.0000 mg | ORAL_TABLET | Freq: Every day | ORAL | Status: DC

## 2014-08-03 NOTE — Telephone Encounter (Deleted)
rx for lasix sent

## 2014-08-05 ENCOUNTER — Telehealth: Payer: Self-pay

## 2014-08-05 NOTE — Telephone Encounter (Signed)
You start at 8:30am and have 4 pts already and one is a double. Still ok to add?

## 2014-08-05 NOTE — Telephone Encounter (Signed)
If for screening only would add to the next outpatient hospital day, if diagnostic can add to hospital week (not mon or fri)

## 2014-08-05 NOTE — Telephone Encounter (Signed)
yes

## 2014-08-05 NOTE — Telephone Encounter (Signed)
Desired confirmation: EF 30-35% 08/2013.  Must be done @ [facility]?  Thank you, Marv Alfrey/previsit

## 2014-08-05 NOTE — Telephone Encounter (Signed)
Henry [name],  This pt does not qualify for care at [facility]  Thanks,  [name]

## 2014-08-05 NOTE — Telephone Encounter (Signed)
Dr. Hilarie Fredrickson do you want this pt added to your 09/15/14 hospital schedule?

## 2014-08-10 ENCOUNTER — Other Ambulatory Visit: Payer: Self-pay

## 2014-08-10 DIAGNOSIS — Z1211 Encounter for screening for malignant neoplasm of colon: Secondary | ICD-10-CM

## 2014-08-10 NOTE — Telephone Encounter (Signed)
Pt scheduled for Colon at [facility] 10/13/14@8 :30am. Please let him know why and the change of date at previsit.

## 2014-08-11 ENCOUNTER — Encounter: Payer: Self-pay | Admitting: Internal Medicine

## 2014-08-11 ENCOUNTER — Ambulatory Visit (INDEPENDENT_AMBULATORY_CARE_PROVIDER_SITE_OTHER): Payer: Federal, State, Local not specified - PPO | Admitting: Internal Medicine

## 2014-08-11 ENCOUNTER — Ambulatory Visit (AMBULATORY_SURGERY_CENTER): Payer: Self-pay

## 2014-08-11 VITALS — BP 140/80 | HR 87 | Temp 98.1°F | Resp 18 | Wt 264.0 lb

## 2014-08-11 VITALS — Ht 68.0 in | Wt 262.0 lb

## 2014-08-11 DIAGNOSIS — Z9989 Dependence on other enabling machines and devices: Secondary | ICD-10-CM

## 2014-08-11 DIAGNOSIS — G4733 Obstructive sleep apnea (adult) (pediatric): Secondary | ICD-10-CM | POA: Diagnosis not present

## 2014-08-11 DIAGNOSIS — Z860100 Personal history of colon polyps, unspecified: Secondary | ICD-10-CM

## 2014-08-11 DIAGNOSIS — G2581 Restless legs syndrome: Secondary | ICD-10-CM | POA: Diagnosis not present

## 2014-08-11 DIAGNOSIS — E785 Hyperlipidemia, unspecified: Secondary | ICD-10-CM | POA: Diagnosis not present

## 2014-08-11 DIAGNOSIS — I255 Ischemic cardiomyopathy: Secondary | ICD-10-CM

## 2014-08-11 MED ORDER — SUPREP BOWEL PREP KIT 17.5-3.13-1.6 GM/177ML PO SOLN: 1.0000 | Freq: Once | ORAL | Status: DC

## 2014-08-11 MED ORDER — TRAMADOL HCL 50 MG PO TABS: ORAL_TABLET | ORAL | Status: DC

## 2014-08-11 NOTE — Patient Instructions (Signed)
Please verify whether you are taking generic Lipitor (atorvastatin)  40 mg daily or not. The cholesterol values in May were extremely good. Such values would not be expected off the medication. My retirement date is 01/09/2015; but I will be in office on a limited schedule Oct-Dec. To guarantee continuity of care you should transition your care to another PCP by Oct 1,2016.

## 2014-08-11 NOTE — Progress Notes (Signed)
Pre visit review using our clinic review tool, if applicable. No additional management support is needed unless otherwise documented below in the visit note. 

## 2014-08-11 NOTE — Progress Notes (Signed)
No allergies to eggs or soy No diet/weight loss meds No home oxygen Has past problem with anesthesia pt stated he got a rash and nausea  Has email  Emmi instructions given for colonoscopy

## 2014-08-11 NOTE — Progress Notes (Signed)
   Subjective:    Patient ID: Henry [name], male    DOB: Dec 23, 1950, 64 y.o.   MRN: [id number]  HPI The patient is here to assess status of active health conditions.  He states that he is not been on a statin for at least 4 months. Crestor was too expensive and he was switched to generic atorvastatin 40 mg daily. He categorically states he never filled the prescription. His lipids were quite good 05/19/14. Triglycerides were 213; HDL 35.5; and LDL 86.  He sees Dr. Dwyane Dee; diabetic control was improved dramatically. His A1c has dropped from 9.7% down to 7.8%.  He is not using his CPAP because he is too claustrophobic.  He's using tramadol on average twice a day for pain as well as restless leg syndrome good control. Clonazepam helps prevent nightmares; but does not help RLS.  Review of Systems Chest pain, palpitations, tachycardia, exertional dyspnea, paroxysmal nocturnal dyspnea, claudication or edema are absent.    Objective:   Physical Exam  Pertinent or positive findings include:  Pattern alopecia is present. He has mask like facies. Ptosis is present greater on the right than the left eye. Pedal pulses are decreased. He has trace edema at the ankles. 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 He has posttraumatic nail changes.  General appearance :adequately nourished; in no distress.  Eyes: No conjunctival inflammation or scleral icterus is present.  Heart:  Normal rate and regular rhythm. S1 and S2 normal without gallop, murmur, click, rub or other extra sounds    Lungs:Chest clear to auscultation; no wheezes, rhonchi,rales ,or rubs present.No increased work of breathing.   Abdomen: bowel sounds normal, soft and non-tender without masses, organomegaly or hernias noted.  No guarding or rebound.   Vascular : all pulses equal ; no bruits present.  Skin:Warm & dry.  Intact without suspicious lesions or rashes ; no tenting or jaundice   Lymphatic: No lymphadenopathy is noted  about the head, neck, axilla.   Neuro: Strength generally decreased. Using cane.        Assessment & Plan:  #1 chronic musculoskeletal pain &  restless legs; responsive to tramadol on average twice a day. Medication will be refilled.  #2 dyslipidemia. It's hard to believe that his lipids were as good  in May without a statin. He'll be asked to verify whether he is taking a statin or not  #3 OSa, non compliant with CPAP. Risk discussed  See orders and recommendations

## 2014-08-12 ENCOUNTER — Encounter: Payer: Self-pay | Admitting: Internal Medicine

## 2014-08-13 ENCOUNTER — Other Ambulatory Visit: Payer: Self-pay | Admitting: Cardiology

## 2014-08-13 DIAGNOSIS — I255 Ischemic cardiomyopathy: Secondary | ICD-10-CM

## 2014-08-13 MED ORDER — SPIRONOLACTONE 25 MG PO TABS: 25.0000 mg | ORAL_TABLET | Freq: Every day | ORAL | Status: DC

## 2014-08-14 ENCOUNTER — Other Ambulatory Visit: Payer: Self-pay | Admitting: Family Medicine

## 2014-08-14 NOTE — Telephone Encounter (Signed)
OK 

## 2014-08-15 ENCOUNTER — Other Ambulatory Visit: Payer: Self-pay | Admitting: Neurology

## 2014-08-17 NOTE — Telephone Encounter (Signed)
Artane refill requested. Per last office note- patient to remain on medication. Refill approved and sent to patient's pharmacy.

## 2014-08-18 ENCOUNTER — Other Ambulatory Visit: Payer: Self-pay | Admitting: Emergency Medicine

## 2014-08-18 MED ORDER — GLUCOSE BLOOD VI STRP: ORAL_STRIP | Status: DC

## 2014-08-18 NOTE — Telephone Encounter (Signed)
RX for test strips sent to pharm.

## 2014-08-20 ENCOUNTER — Encounter: Payer: Self-pay | Admitting: Endocrinology

## 2014-08-20 ENCOUNTER — Ambulatory Visit (INDEPENDENT_AMBULATORY_CARE_PROVIDER_SITE_OTHER): Payer: Federal, State, Local not specified - PPO | Admitting: Endocrinology

## 2014-08-20 VITALS — BP 154/84 | HR 83 | Temp 98.3°F | Resp 16 | Ht 68.0 in | Wt 262.8 lb

## 2014-08-20 DIAGNOSIS — IMO0002 Reserved for concepts with insufficient information to code with codable children: Secondary | ICD-10-CM

## 2014-08-20 DIAGNOSIS — E890 Postprocedural hypothyroidism: Secondary | ICD-10-CM | POA: Diagnosis not present

## 2014-08-20 DIAGNOSIS — E1165 Type 2 diabetes mellitus with hyperglycemia: Secondary | ICD-10-CM | POA: Diagnosis not present

## 2014-08-20 LAB — COMPREHENSIVE METABOLIC PANEL
ALBUMIN: 4.2 g/dL (ref 3.5–5.2)
ALT: 21 U/L (ref 0–53)
AST: 13 U/L (ref 0–37)
Alkaline Phosphatase: 69 U/L (ref 39–117)
BUN: 17 mg/dL (ref 6–23)
CO2: 31 meq/L (ref 19–32)
Calcium: 9.3 mg/dL (ref 8.4–10.5)
Chloride: 99 mEq/L (ref 96–112)
Creatinine, Ser: 1.13 mg/dL (ref 0.40–1.50)
GFR: 69.65 mL/min (ref >60.00)
GLUCOSE: 152 mg/dL — AB (ref 70–99)
POTASSIUM: 3.6 meq/L (ref 3.5–5.1)
Sodium: 141 mEq/L (ref 135–145)
TOTAL PROTEIN: 6.9 g/dL (ref 6.0–8.3)
Total Bilirubin: 0.4 mg/dL (ref 0.2–1.2)

## 2014-08-20 LAB — T4, FREE: Free T4: 0.82 ng/dL (ref 0.60–1.60)

## 2014-08-20 LAB — TSH: TSH: 3.57 u[IU]/mL (ref 0.35–4.50)

## 2014-08-20 LAB — HEMOGLOBIN A1C: Hgb A1c MFr Bld: 7.2 % — ABNORMAL HIGH (ref 4.6–6.5)

## 2014-08-20 NOTE — Patient Instructions (Signed)
Check blood sugars on waking up ..3  .. times a week Also check blood sugars about 2 hours after a meal and do this after different meals by rotation  Recommended blood sugar levels on waking up is 90-130 and about 2 hours after meal is 140-180 Please bring blood sugar monitor to each visit.  Lantus 26 units dails

## 2014-08-20 NOTE — Progress Notes (Signed)
Patient ID: Henry [name], male   DOB: 1950/04/02, 64 y.o.   MRN: [id number]           Reason for Appointment: Follow-up for Type 2 Diabetes  Referring physician: Linna Darner  History of Present Illness:          Diagnosis: Type 2 diabetes mellitus, date of diagnosis: 2000      Past history:   Patient thinks he has been taking Amaryl for several years and probably metformin since onset also At some point he was changed from metformin to [facility] and was taking this since at least 2012 A1c had been higher in 2015 Since his A1c had been progressively higher with his regimen of Janumet and Amaryl he was started on Victoza in 01/2014  Recent history:   LANTUS dosage: 24 units  His A1c is slightly better at 7.2% and is on a regimen of metformin, Amaryl and Victoza 1.8 mg His weight has also been difficult to control He was started on Lantus insulin in 5/16 because of persistent hyperglycemia especially fasting; was having readings as high as 293  Current blood sugar patterns and problems identified:  His blood sugars have been variable although  better recently in the mornings.  He has done very few readings after meals and is appeared to be mostly high  He has taken only 1.2 mg Victoza daily because of high cost of 1.8 mg  He has  seen the dietitian in 3/16 Not able to exercise much to be effective in weight loss       Oral hypoglycemic drugs the patient is taking are: Metformin 1 g twice a day, Amaryl  4 mg a.m. and 2 mg p.m.   Side effects from medications have been: None Compliance with the medical regimen: Fair  Hypoglycemia:   none  Glucose monitoring:  done 1-2 times  a day         Glucometer: One Touch.      Blood Glucose readings by download recently; overall average is for the last 4 weeks  Mean values apply above for all meters except median for One Touch  PRE-MEAL Fasting Lunch Dinner Bedtime Overall  Glucose range:  131-264   154   169-289   163-283      Mean/median: 167    208   170     Self-care: The diet that the patient has been following is:  tries to eat more vegetables .           Exercise: unable to do any         Dietician visit, most recent: 03/2014                Weight history: Previous range: 266-277  Wt Readings from Last 3 Encounters:  08/20/14 262 lb 12.8 oz (119.205 kg)  08/11/14 264 lb (119.75 kg)  08/11/14 262 lb (118.842 kg)    Glycemic control:   Lab Results  Component Value Date   HGBA1C 7.2* 08/20/2014   HGBA1C 7.8* 05/19/2014   HGBA1C 9.7* 01/13/2014   Lab Results  Component Value Date   MICROALBUR 0.9 01/13/2014   LDLCALC 126* 04/08/2013   CREATININE 1.13 08/20/2014         Medication List       This list is accurate as of: 08/20/14 11:59 PM.  Always use your most recent med list.               acetaminophen 325 MG tablet  Commonly  known as:  TYLENOL  Take 1-2 tablets (325-650 mg total) by mouth every 4 (four) hours as needed for mild pain.     albuterol 108 (90 BASE) MCG/ACT inhaler  Commonly known as:  PROVENTIL HFA;VENTOLIN HFA  Inhale 2 puffs into the lungs every 6 (six) hours as needed for wheezing or shortness of breath.     aspirin EC 81 MG tablet  Take 81 mg by mouth daily.     atorvastatin 40 MG tablet  Commonly known as:  LIPITOR  Take 1 tablet (40 mg total) by mouth daily.     clonazePAM 0.5 MG tablet  Commonly known as:  KLONOPIN  TAKE 1 & 1/2 TABLETS AT BEDTIME     Fluticasone-Salmeterol 250-50 MCG/DOSE Aepb  Commonly known as:  ADVAIR  Inhale 1 puff into the lungs 2 (two) times daily as needed (for copd symptoms).     furosemide 80 MG tablet  Commonly known as:  LASIX  Take 1.5 tablets by mouth daily. PT NEEDS TO BE SEEN IN OFFICE BEFORE FURTHER REFILLS     glimepiride 4 MG tablet  Commonly known as:  AMARYL  1 in am and 1/2 at supper     ibuprofen 200 MG tablet  Commonly known as:  ADVIL,MOTRIN  Take 800 mg by mouth daily as needed for pain.      Insulin Glargine 100 UNIT/ML Solostar Pen  Commonly known as:  LANTUS SOLOSTAR  Inject up to 20 units at night     Insulin Pen Needle 32G X 5 MM Misc  Commonly known as:  NOVOTWIST  Use one per day to inject Victoza     KLOR-CON M10 10 MEQ tablet  Generic drug:  potassium chloride  TAKE 3 TABLETS BY MOUTH DAILY     levothyroxine 125 MCG tablet  Commonly known as:  SYNTHROID, LEVOTHROID  TAKE 1 TABLET EVERY DAY DAILY BEFORE BREAKFAST     metFORMIN 1000 MG tablet  Commonly known as:  GLUCOPHAGE  Take 1 tablet (1,000 mg total) by mouth 2 (two) times daily with a meal.     metoprolol succinate 50 MG 24 hr tablet  Commonly known as:  TOPROL-XL  TAKE 1 TABLET (50 MG TOTAL) BY MOUTH AT BEDTIME. TAKE WITH OR IMMEDIATELY FOLLOWING A MEAL.     multivitamin with minerals Tabs tablet  Take 1 tablet by mouth daily. Antioxidant  Multi vitamin     nitroGLYCERIN 0.4 MG SL tablet  Commonly known as:  NITROSTAT  Place 1 tablet (0.4 mg total) under the tongue every 5 (five) minutes as needed for chest pain.     ONE TOUCH ULTRA TEST test strip  Generic drug:  glucose blood  CHECK BLOOD SUGAR ONCE A DAY     glucose blood test strip  Commonly known as:  ONE TOUCH ULTRA TEST  Use as instructed to check blood sugar 2 times per day dx code V78.58     [facility] DELICA LANCETS FINE Misc  Check Blood Sugar Daily     pramipexole 0.5 MG tablet  Commonly known as:  MIRAPEX  Take 0.5 mg by mouth 2 (two) times daily.     spironolactone 25 MG tablet  Commonly known as:  ALDACTONE  Take 1 tablet (25 mg total) by mouth daily.     SUPREP BOWEL PREP Soln  Generic drug:  Na Sulfate-K Sulfate-Mg Sulf  Take 1 kit by mouth once.     traMADol 50 MG tablet  Commonly known as:  Veatrice Bourbon  TAKE 1 TABLET TWICE A DAY AS NEED     travoprost (benzalkonium) 0.004 % ophthalmic solution  Commonly known as:  TRAVATAN  Place 1 drop into the right eye at bedtime.     trihexyphenidyl 2 MG tablet  Commonly known  as:  ARTANE  Take 1 tablet (2 mg total) by mouth 2 (two) times daily.     VICTOZA 18 MG/3ML Sopn  Generic drug:  Liraglutide  INJECT 1.8 MG ONCE A DAY     vitamin B-12 1000 MCG tablet  Commonly known as:  CYANOCOBALAMIN  Take 1,000 mcg by mouth daily.        Allergies:  Allergies  Allergen Reactions  . Penicillins Rash    Because of a history of documented adverse serious drug reaction;Medi Alert bracelet  is recommended    Past Medical History  Diagnosis Date  . Cardiomyopathy   . CAD (coronary artery disease)   . HTN (hypertension)     pt denies 08/19/12  . MI (myocardial infarction)   . Sleep apnea   . GERD (gastroesophageal reflux disease)   . Hyperthyroidism   . Hyperplasia, prostate   . Benign neoplasm of colon   . Nephrolithiasis   . Ventral hernia   . HLD (hyperlipidemia)   . Parkinson disease     1999  . OSA (obstructive sleep apnea)     AHI-28,on CPAP, noncompliant with CPAP  . Sleep apnea, organic   . Complication of anesthesia     pt states that he got a rash  . Arthritis   . Shortness of breath     Hx: of at all times  . UTI (lower urinary tract infection) 09/15/12    Klebsiella  . Asthma   . DM (diabetes mellitus)     TYPE 2     Past Surgical History  Procedure Laterality Date  . Coronary artery bypass graft  2000    [name], MD  . Coronary stent placement  1998  . Median sternotomy  2000  . Deep brain stimulator placement  2004    Right and left VIN stimulator placement   . Acoustic neuroma resection  1981  . Finger amputation      left pointer  . Cataract extraction w/ intraocular lens implant      Hx: of right eye  . Tonsillectomy    . Colonoscopy w/ biopsies and polypectomy      Hx: of  . Cardiac catheterization    . Subthalamic stimulator battery replacement N/A 09/05/2012    Procedure: Deep brain stimulator battery change;  Surgeon: Erline Levine, MD;  Location: [facility];  Service: Neurosurgery;  Laterality: N/A;  Deep  brain stimulator battery change  . Left and right heart catheterization with coronary angiogram N/A 09/24/2013    Procedure: LEFT AND RIGHT HEART CATHETERIZATION WITH CORONARY ANGIOGRAM;  Surgeon: Burnell Blanks, MD;  Location: [facility] CATH LAB;  Service: Cardiovascular;  Laterality: N/A;  . Implantable cardioverter defibrillator implant N/A 11/13/2013    Procedure: IMPLANTABLE CARDIOVERTER DEFIBRILLATOR IMPLANT;  Surgeon: Evans Lance, MD;  Location: [facility] CATH LAB;  Service: Cardiovascular;  Laterality: N/A;    Family History  Problem Relation Age of Onset  . Peripheral vascular disease    . Arthritis    . Alcoholism Mother 51    deceased   . Aneurysm Father 42  . Autoimmune disease Brother 39    AIDS  . Diabetes Neg Hx   . Heart disease Neg Hx   .  Colon cancer Neg Hx     Social History:  reports that he has never smoked. He has never used smokeless tobacco. He reports that he drinks alcohol. He reports that he does not use illicit drugs.    Review of Systems     Most recent eye exam was 8/15       Lipids: On Lipitor, tends to have high triglycerides and low HDL      Lab Results  Component Value Date   CHOL 147 05/19/2014   HDL 35.50* 05/19/2014   LDLCALC 126* 04/08/2013   LDLDIRECT 86.0 05/19/2014   TRIG 213.0* 05/19/2014   CHOLHDL 4 05/19/2014                  Thyroid:   He has had hypothyroidism following radioactive iodine treatment for hyperthyroidism in 2013  TSH is improved currently with those of 125 g   Lab Results  Component Value Date   TSH 3.57 08/20/2014   TSH 4.60* 01/13/2014   TSH 10.27* 08/06/2013   FREET4 0.82 08/20/2014   FREET4 0.98 11/20/2011   FREET4 0.91 10/16/2011       The blood pressure has been followed by [facility], currently taking losartan  Diabetic foot exam in 1/16 showed shows normal monofilament sensation in the toes except decreased on the right great toe and plantar surfaces, no skin lesions or ulcers on the feet and normal  pedal pulses    Physical Examination:  BP 154/84 mmHg  Pulse 83  Temp(Src) 98.3 F (36.8 C)  Resp 16  Ht $R'5\' 8"'PF$  (1.727 m)  Wt 262 lb 12.8 oz (119.205 kg)  BMI 39.97 kg/m2  SpO2 94%     ASSESSMENT:  Diabetes type 2, uncontrolled with obesity    He appears to be getting improvement in his blood sugar control with A1c 7.2% However does has sporadic high readings after meals when he does not watch his diet consistently His fasting blood sugars are still relatively high although improving more recently He is unable to exercise and this is a limiting factor in his weight loss  PLAN:  He will increase his Lantus by at least 2 units now and take 26 units  He needs to start checking more readings after meals consistently  Continue Victoza 1.2 mg daily  More consistent diet and reduce high carbohydrate and high fat foods  Will review his blood sugars again in 6 weeks and if postprandial readings are consistently high may need to use a mealtime insulin or consider V-go pump   Patient Instructions  Check blood sugars on waking up ..3  .. times a week Also check blood sugars about 2 hours after a meal and do this after different meals by rotation  Recommended blood sugar levels on waking up is 90-130 and about 2 hours after meal is 140-180 Please bring blood sugar monitor to each visit.  Lantus 26 units dails      Counseling time on subjects discussed above is over 50% of today's 25 minute visit  Ziere Docken 08/24/2014, 10:39 AM   Note: This office note was prepared with Estate agent. Any transcriptional errors that result from this process are unintentional.

## 2014-08-21 ENCOUNTER — Other Ambulatory Visit: Payer: Self-pay | Admitting: Internal Medicine

## 2014-08-24 ENCOUNTER — Telehealth: Payer: Self-pay | Admitting: *Deleted

## 2014-08-24 ENCOUNTER — Other Ambulatory Visit: Payer: Self-pay | Admitting: *Deleted

## 2014-08-24 MED ORDER — V-GO 20 KIT: PACK | Status: DC

## 2014-08-24 NOTE — Telephone Encounter (Signed)
[facility] called, they need an rx for the V-Go kit to be faxed, please advise what size kit to send?

## 2014-08-24 NOTE — Telephone Encounter (Signed)
20 units

## 2014-08-25 ENCOUNTER — Encounter: Payer: Federal, State, Local not specified - PPO | Admitting: Internal Medicine

## 2014-08-31 ENCOUNTER — Ambulatory Visit (INDEPENDENT_AMBULATORY_CARE_PROVIDER_SITE_OTHER): Payer: Federal, State, Local not specified - PPO | Admitting: *Deleted

## 2014-08-31 DIAGNOSIS — I5022 Chronic systolic (congestive) heart failure: Secondary | ICD-10-CM

## 2014-08-31 DIAGNOSIS — I255 Ischemic cardiomyopathy: Secondary | ICD-10-CM | POA: Diagnosis not present

## 2014-09-01 NOTE — Progress Notes (Signed)
Remote ICD transmission.   

## 2014-09-04 LAB — CUP PACEART REMOTE DEVICE CHECK
Battery Remaining Longevity: 129 mo
Battery Voltage: 3.02 V
Brady Statistic AS VS Percent: 99.81 %
Brady Statistic RA Percent Paced: 0.16 %
HighPow Impedance: 72 Ohm
Lead Channel Impedance Value: 456 Ohm
Lead Channel Impedance Value: 494 Ohm
Lead Channel Pacing Threshold Amplitude: 1.125 V
Lead Channel Pacing Threshold Pulse Width: 0.4 ms
Lead Channel Pacing Threshold Pulse Width: 0.4 ms
Lead Channel Sensing Intrinsic Amplitude: 2.125 mV
Lead Channel Setting Sensing Sensitivity: 0.3 mV
MDC IDC MSMT LEADCHNL RA SENSING INTR AMPL: 2.125 mV
MDC IDC MSMT LEADCHNL RV IMPEDANCE VALUE: 399 Ohm
MDC IDC MSMT LEADCHNL RV PACING THRESHOLD AMPLITUDE: 0.375 V
MDC IDC MSMT LEADCHNL RV SENSING INTR AMPL: 16.875 mV
MDC IDC MSMT LEADCHNL RV SENSING INTR AMPL: 16.875 mV
MDC IDC SESS DTM: 20160822151120
MDC IDC SET LEADCHNL RA PACING AMPLITUDE: 2.25 V
MDC IDC SET LEADCHNL RV PACING AMPLITUDE: 2.5 V
MDC IDC SET LEADCHNL RV PACING PULSEWIDTH: 0.4 ms
MDC IDC SET ZONE DETECTION INTERVAL: 350 ms
MDC IDC SET ZONE DETECTION INTERVAL: 450 ms
MDC IDC STAT BRADY AP VP PERCENT: 0.01 %
MDC IDC STAT BRADY AP VS PERCENT: 0.15 %
MDC IDC STAT BRADY AS VP PERCENT: 0.03 %
MDC IDC STAT BRADY RV PERCENT PACED: 0.03 %
Zone Setting Detection Interval: 300 ms
Zone Setting Detection Interval: 360 ms

## 2014-09-08 ENCOUNTER — Encounter: Payer: Federal, State, Local not specified - PPO | Attending: Endocrinology | Admitting: Nutrition

## 2014-09-08 DIAGNOSIS — Z713 Dietary counseling and surveillance: Secondary | ICD-10-CM | POA: Insufficient documentation

## 2014-09-08 DIAGNOSIS — E1151 Type 2 diabetes mellitus with diabetic peripheral angiopathy without gangrene: Secondary | ICD-10-CM | POA: Insufficient documentation

## 2014-09-08 DIAGNOSIS — Z794 Long term (current) use of insulin: Secondary | ICD-10-CM | POA: Diagnosis not present

## 2014-09-08 DIAGNOSIS — E1165 Type 2 diabetes mellitus with hyperglycemia: Secondary | ICD-10-CM

## 2014-09-08 DIAGNOSIS — E1159 Type 2 diabetes mellitus with other circulatory complications: Secondary | ICD-10-CM

## 2014-09-09 NOTE — Progress Notes (Signed)
[name] and his wife were instructed on how to fill, apply and use the V-go.  He brought his box of  V-go 20s in, and he filled  his first V-go using Humalog insulin with very little assistance from me, following directions from the picture directions in his box, and his wife's assistance.  He will stop his lantus tonight and attach the V-go in the AM. We reviewed the directions for how to attach this and how to give the meal time clicks.  I will call him in the AM with the amount of clicks to give before each meal.  They had no final questions.  I reminded him of the need to stop his lantus insulin tonight and to take no more, and he said that he wanted to use up the lantus pens that  he purchased 2 weeks ago.  His wife says that he has about 4 pens remaining.   They agreed to call me when they finish their lantus,  are ready to restart this V-go and they can be retrained--in 4 weeks.

## 2014-09-09 NOTE — Patient Instructions (Signed)
Stop Lantus insulin the night before you begin the V-go in the AM Apply a new v-go every morning.

## 2014-09-10 ENCOUNTER — Encounter: Payer: Self-pay | Admitting: Cardiology

## 2014-09-17 ENCOUNTER — Other Ambulatory Visit: Payer: Self-pay | Admitting: Physical Medicine and Rehabilitation

## 2014-09-17 ENCOUNTER — Other Ambulatory Visit: Payer: Self-pay | Admitting: Neurology

## 2014-09-17 DIAGNOSIS — M545 Low back pain: Secondary | ICD-10-CM

## 2014-09-17 DIAGNOSIS — M47817 Spondylosis without myelopathy or radiculopathy, lumbosacral region: Secondary | ICD-10-CM | POA: Diagnosis not present

## 2014-09-21 ENCOUNTER — Other Ambulatory Visit: Payer: Self-pay | Admitting: Internal Medicine

## 2014-09-21 ENCOUNTER — Encounter: Payer: Self-pay | Admitting: Internal Medicine

## 2014-09-23 ENCOUNTER — Ambulatory Visit
Admission: RE | Admit: 2014-09-23 | Discharge: 2014-09-23 | Disposition: A | Discharge status: Home / Self Care | Payer: Federal, State, Local not specified - PPO | Source: Ambulatory Visit | Attending: Physical Medicine and Rehabilitation | Admitting: Physical Medicine and Rehabilitation

## 2014-09-23 DIAGNOSIS — M545 Low back pain: Secondary | ICD-10-CM

## 2014-09-23 DIAGNOSIS — M4806 Spinal stenosis, lumbar region: Secondary | ICD-10-CM | POA: Diagnosis not present

## 2014-09-26 ENCOUNTER — Encounter (HOSPITAL_COMMUNITY): Payer: Self-pay | Admitting: *Deleted

## 2014-09-26 ENCOUNTER — Emergency Department (HOSPITAL_COMMUNITY)
Admission: EM | Admit: 2014-09-26 | Discharge: 2014-09-26 | Disposition: A | Discharge status: Home / Self Care | Payer: Federal, State, Local not specified - PPO | Attending: Emergency Medicine | Admitting: Emergency Medicine

## 2014-09-26 DIAGNOSIS — J45909 Unspecified asthma, uncomplicated: Secondary | ICD-10-CM | POA: Insufficient documentation

## 2014-09-26 DIAGNOSIS — S30861A Insect bite (nonvenomous) of abdominal wall, initial encounter: Secondary | ICD-10-CM | POA: Insufficient documentation

## 2014-09-26 DIAGNOSIS — E785 Hyperlipidemia, unspecified: Secondary | ICD-10-CM | POA: Diagnosis not present

## 2014-09-26 DIAGNOSIS — H9190 Unspecified hearing loss, unspecified ear: Secondary | ICD-10-CM | POA: Diagnosis not present

## 2014-09-26 DIAGNOSIS — Z87438 Personal history of other diseases of male genital organs: Secondary | ICD-10-CM | POA: Diagnosis not present

## 2014-09-26 DIAGNOSIS — Z8719 Personal history of other diseases of the digestive system: Secondary | ICD-10-CM | POA: Diagnosis not present

## 2014-09-26 DIAGNOSIS — Z7982 Long term (current) use of aspirin: Secondary | ICD-10-CM | POA: Insufficient documentation

## 2014-09-26 DIAGNOSIS — I10 Essential (primary) hypertension: Secondary | ICD-10-CM | POA: Insufficient documentation

## 2014-09-26 DIAGNOSIS — Z9889 Other specified postprocedural states: Secondary | ICD-10-CM | POA: Insufficient documentation

## 2014-09-26 DIAGNOSIS — E119 Type 2 diabetes mellitus without complications: Secondary | ICD-10-CM | POA: Insufficient documentation

## 2014-09-26 DIAGNOSIS — E0590 Thyrotoxicosis, unspecified without thyrotoxic crisis or storm: Secondary | ICD-10-CM | POA: Insufficient documentation

## 2014-09-26 DIAGNOSIS — Y999 Unspecified external cause status: Secondary | ICD-10-CM | POA: Insufficient documentation

## 2014-09-26 DIAGNOSIS — M1990 Unspecified osteoarthritis, unspecified site: Secondary | ICD-10-CM | POA: Insufficient documentation

## 2014-09-26 DIAGNOSIS — Z951 Presence of aortocoronary bypass graft: Secondary | ICD-10-CM | POA: Diagnosis not present

## 2014-09-26 DIAGNOSIS — S80861A Insect bite (nonvenomous), right lower leg, initial encounter: Secondary | ICD-10-CM | POA: Insufficient documentation

## 2014-09-26 DIAGNOSIS — Z79899 Other long term (current) drug therapy: Secondary | ICD-10-CM | POA: Insufficient documentation

## 2014-09-26 DIAGNOSIS — S80862A Insect bite (nonvenomous), left lower leg, initial encounter: Secondary | ICD-10-CM | POA: Diagnosis present

## 2014-09-26 DIAGNOSIS — Z9861 Coronary angioplasty status: Secondary | ICD-10-CM | POA: Insufficient documentation

## 2014-09-26 DIAGNOSIS — Z87440 Personal history of urinary (tract) infections: Secondary | ICD-10-CM | POA: Insufficient documentation

## 2014-09-26 DIAGNOSIS — Z794 Long term (current) use of insulin: Secondary | ICD-10-CM | POA: Insufficient documentation

## 2014-09-26 DIAGNOSIS — W57XXXA Bitten or stung by nonvenomous insect and other nonvenomous arthropods, initial encounter: Secondary | ICD-10-CM | POA: Diagnosis not present

## 2014-09-26 DIAGNOSIS — S90861A Insect bite (nonvenomous), right foot, initial encounter: Secondary | ICD-10-CM | POA: Insufficient documentation

## 2014-09-26 DIAGNOSIS — I252 Old myocardial infarction: Secondary | ICD-10-CM | POA: Diagnosis not present

## 2014-09-26 DIAGNOSIS — Y92828 Other wilderness area as the place of occurrence of the external cause: Secondary | ICD-10-CM | POA: Insufficient documentation

## 2014-09-26 DIAGNOSIS — G4733 Obstructive sleep apnea (adult) (pediatric): Secondary | ICD-10-CM | POA: Insufficient documentation

## 2014-09-26 DIAGNOSIS — S30860A Insect bite (nonvenomous) of lower back and pelvis, initial encounter: Secondary | ICD-10-CM | POA: Diagnosis not present

## 2014-09-26 DIAGNOSIS — Z880 Allergy status to penicillin: Secondary | ICD-10-CM | POA: Diagnosis not present

## 2014-09-26 DIAGNOSIS — Y9301 Activity, walking, marching and hiking: Secondary | ICD-10-CM | POA: Diagnosis not present

## 2014-09-26 DIAGNOSIS — Z87442 Personal history of urinary calculi: Secondary | ICD-10-CM | POA: Insufficient documentation

## 2014-09-26 DIAGNOSIS — Z85038 Personal history of other malignant neoplasm of large intestine: Secondary | ICD-10-CM | POA: Diagnosis not present

## 2014-09-26 DIAGNOSIS — G20 Parkinson's disease: Secondary | ICD-10-CM | POA: Insufficient documentation

## 2014-09-26 DIAGNOSIS — S90862A Insect bite (nonvenomous), left foot, initial encounter: Secondary | ICD-10-CM | POA: Diagnosis not present

## 2014-09-26 DIAGNOSIS — Z9981 Dependence on supplemental oxygen: Secondary | ICD-10-CM | POA: Diagnosis not present

## 2014-09-26 DIAGNOSIS — Z95810 Presence of automatic (implantable) cardiac defibrillator: Secondary | ICD-10-CM | POA: Diagnosis not present

## 2014-09-26 DIAGNOSIS — I2510 Atherosclerotic heart disease of native coronary artery without angina pectoris: Secondary | ICD-10-CM | POA: Insufficient documentation

## 2014-09-26 MED ORDER — FLURANDRENOLIDE 0.05 % EX CREA: TOPICAL_CREAM | CUTANEOUS | Status: DC

## 2014-09-26 NOTE — ED Provider Notes (Signed)
CSN: [id number]     Arrival date & time 09/26/14  4665 History  This chart was scribed for Alecia Lemming, working with Merrily Pew, MD by Steva Colder, ED Scribe. The patient was seen in room TR06C/TR06C at 9:21 AM.     Chief Complaint  Patient presents with  . Rash     The history is provided by the patient. No language interpreter was used.   Henry [name] is a 64 y.o. male with a medical hx of DM, who presents to the Emergency Department complaining of rash onset 2 days ago. Pt notes that he was walking in the woods and the rash is itchy and without pain. He has the rash to his bilateral feet, bilateral legs, abdomen, and back. He reports that he was wearing shorts and a tennis shoes at the time. He notes that he is the only one with the symptoms. Pt denies new soaps/medications/pets/lotion/detergent. Pt is having associated symptoms of color change. He notes that he has tried benadryl for the relief of his symptoms. He denies wound, fever, lip/oral swelling, and any other symptoms.    Past Medical History  Diagnosis Date  . Cardiomyopathy   . CAD (coronary artery disease)   . HTN (hypertension)     pt denies 08/19/12  . MI (myocardial infarction)   . Sleep apnea   . GERD (gastroesophageal reflux disease)   . Hyperthyroidism   . Hyperplasia, prostate   . Benign neoplasm of colon   . Nephrolithiasis   . Ventral hernia   . HLD (hyperlipidemia)   . Parkinson disease     1999  . OSA (obstructive sleep apnea)     AHI-28,on CPAP, noncompliant with CPAP  . Sleep apnea, organic   . Complication of anesthesia     pt states that he got a rash  . Arthritis   . Shortness of breath     Hx: of at all times  . UTI (lower urinary tract infection) 09/15/12    Klebsiella  . Asthma   . DM (diabetes mellitus)     TYPE 2    Past Surgical History  Procedure Laterality Date  . Coronary artery bypass graft  2000    [name], MD  . Coronary stent placement  1998  . Median sternotomy   2000  . Deep brain stimulator placement  2004    Right and left VIN stimulator placement   . Acoustic neuroma resection  1981  . Finger amputation      left pointer  . Cataract extraction w/ intraocular lens implant      Hx: of right eye  . Tonsillectomy    . Colonoscopy w/ biopsies and polypectomy      Hx: of  . Cardiac catheterization    . Subthalamic stimulator battery replacement N/A 09/05/2012    Procedure: Deep brain stimulator battery change;  Surgeon: Erline Levine, MD;  Location: [place];  Service: Neurosurgery;  Laterality: N/A;  Deep brain stimulator battery change  . Left and right heart catheterization with coronary angiogram N/A 09/24/2013    Procedure: LEFT AND RIGHT HEART CATHETERIZATION WITH CORONARY ANGIOGRAM;  Surgeon: Burnell Blanks, MD;  Location: [facility] CATH LAB;  Service: Cardiovascular;  Laterality: N/A;  . Implantable cardioverter defibrillator implant N/A 11/13/2013    Procedure: IMPLANTABLE CARDIOVERTER DEFIBRILLATOR IMPLANT;  Surgeon: Evans Lance, MD;  Location: [facility] CATH LAB;  Service: Cardiovascular;  Laterality: N/A;   Family History  Problem Relation Age of Onset  .  Peripheral vascular disease    . Arthritis    . Alcoholism Mother 10    deceased   . Aneurysm Father 8  . Autoimmune disease Brother 23    AIDS  . Diabetes Neg Hx   . Heart disease Neg Hx   . Colon cancer Neg Hx    Social History  Substance Use Topics  . Smoking status: Never Smoker   . Smokeless tobacco: Never Used  . Alcohol Use: Yes     Comment: occasional wine    Review of Systems  Constitutional: Negative for fever.  HENT: Negative for facial swelling and trouble swallowing.        No lip/oral swelling  Eyes: Negative for redness.  Respiratory: Negative for shortness of breath, wheezing and stridor.   Cardiovascular: Negative for chest pain.  Gastrointestinal: Negative for nausea and vomiting.  Musculoskeletal: Negative for myalgias.  Skin: Positive for color  change and rash. Negative for wound.  Neurological: Negative for light-headedness.  Psychiatric/Behavioral: Negative for confusion.    Allergies  Penicillins  Home Medications   Prior to Admission medications   Medication Sig Start Date End Date Taking? Authorizing [name]  acetaminophen (TYLENOL) 325 MG tablet Take 1-2 tablets (325-650 mg total) by mouth every 4 (four) hours as needed for mild pain. 11/14/13   Erlene Quan, PA-C  albuterol (PROVENTIL HFA;VENTOLIN HFA) 108 (90 BASE) MCG/ACT inhaler Inhale 2 puffs into the lungs every 6 (six) hours as needed for wheezing or shortness of breath. 01/05/14   Hendricks Limes, MD  aspirin EC 81 MG tablet Take 81 mg by mouth daily.    Historical Equan Cogbill, MD  atorvastatin (LIPITOR) 40 MG tablet TAKE 1 TABLET (40 MG TOTAL) BY MOUTH DAILY. 09/21/14   Hendricks Limes, MD  clonazePAM (KLONOPIN) 0.5 MG tablet TAKE 1 & 1/2 TABLETS AT BEDTIME Patient taking differently: TAKE 1 & 1/2 TABLETS AT BEDTIME PRN Sleep 06/29/14   Alda Berthold, DO  Fluticasone-Salmeterol (ADVAIR) 250-50 MCG/DOSE AEPB Inhale 1 puff into the lungs 2 (two) times daily as needed (for copd symptoms). 01/05/14   Hendricks Limes, MD  furosemide (LASIX) 80 MG tablet Take 1.5 tablets by mouth daily. PT NEEDS TO BE SEEN IN OFFICE BEFORE FURTHER REFILLS 08/05/14   Hendricks Limes, MD  glimepiride (AMARYL) 4 MG tablet 1 in am and 1/2 at supper 07/22/14   Elayne Snare, MD  ibuprofen (ADVIL,MOTRIN) 200 MG tablet Take 800 mg by mouth daily as needed for pain.    Historical Mikey Maffett, MD  Insulin Disposable Pump (V-GO 20) KIT Use one per day Patient not taking: Reported on 09/25/2014 08/24/14   Elayne Snare, MD  Insulin Glargine (LANTUS SOLOSTAR) 100 UNIT/ML Solostar Pen Inject up to 20 units at night Patient taking differently: Inject 26 Units into the skin at bedtime.  05/22/14   Elayne Snare, MD  KLOR-CON M10 10 MEQ tablet TAKE 3 TABLETS BY MOUTH DAILY 05/29/14   Hendricks Limes, MD  levothyroxine  (SYNTHROID, LEVOTHROID) 125 MCG tablet TAKE 1 TABLET EVERY DAY DAILY BEFORE BREAKFAST 08/21/14   Hendricks Limes, MD  metFORMIN (GLUCOPHAGE) 1000 MG tablet Take 1 tablet (1,000 mg total) by mouth 2 (two) times daily with a meal. 02/17/14   Elayne Snare, MD  metoprolol succinate (TOPROL-XL) 50 MG 24 hr tablet TAKE 1 TABLET (50 MG TOTAL) BY MOUTH AT BEDTIME. TAKE WITH OR IMMEDIATELY FOLLOWING A MEAL. 07/23/14   Lelon Perla, MD  Multiple Vitamin (MULTIVITAMIN WITH MINERALS)  TABS tablet Take 1 tablet by mouth daily. Antioxidant  Multi vitamin    Historical Brieann Osinski, MD  nitroGLYCERIN (NITROSTAT) 0.4 MG SL tablet Place 1 tablet (0.4 mg total) under the tongue every 5 (five) minutes as needed for chest pain. 05/13/13 12/14/14  Erlene Quan, PA-C  pramipexole (MIRAPEX) 0.5 MG tablet Take 0.5 mg by mouth 2 (two) times daily. 09/02/13   Hendricks Limes, MD  spironolactone (ALDACTONE) 25 MG tablet Take 1 tablet (25 mg total) by mouth daily. 08/13/14   Lelon Perla, MD  SUPREP BOWEL PREP SOLN Take 1 kit by mouth once. 08/11/14   Jerene Bears, MD  traMADol (ULTRAM) 50 MG tablet TAKE 1 TABLET TWICE A DAY AS NEED Patient taking differently: Take 50 mg by mouth 2 (two) times daily as needed for moderate pain.  08/11/14   Hendricks Limes, MD  travoprost, benzalkonium, (TRAVATAN) 0.004 % ophthalmic solution Place 1 drop into the right eye at bedtime.     Historical Joannah Gitlin, MD  trihexyphenidyl (ARTANE) 2 MG tablet Take 1 tablet (2 mg total) by mouth 2 (two) times daily. 08/17/14   Rebecca S Tat, DO  VICTOZA 18 MG/3ML SOPN INJECT 1.8 MG ONCE A DAY 07/14/14   Elayne Snare, MD  vitamin B-12 (CYANOCOBALAMIN) 1000 MCG tablet Take 1,000 mcg by mouth daily.    Historical Nea Gittens, MD   BP 123/74 mmHg  Pulse 86  Temp(Src) 98.2 F (36.8 C) (Oral)  Resp 20  Ht $R'5\' 8"'aT$  (1.727 m)  Wt 259 lb (117.482 kg)  BMI 39.39 kg/m2  SpO2 98% Physical Exam  Constitutional: He appears well-developed and well-nourished.  HENT:  Head:  Normocephalic and atraumatic.  No angioedema. No mucosal abnormalities.  Eyes: Conjunctivae are normal.  Neck: Normal range of motion. Neck supple.  Pulmonary/Chest: No respiratory distress.  Neurological: He is alert.  Skin: Skin is warm and dry.  Patient with several areas of grouped papules noted on the lower extremities consistent with insect bite.  Psychiatric: He has a normal mood and affect.  Nursing note and vitals reviewed.   ED Course  Procedures (including critical care time) DIAGNOSTIC STUDIES: Oxygen Saturation is 98% on RA, nl by my interpretation.    COORDINATION OF CARE: 9:26 AM Discussed treatment plan with pt at bedside which includes antihistamine Rx, steroid cream Rx, and f/u with PCP PRN, and pt agreed to plan.   Labs Review Labs Reviewed - No data to display  Imaging Review No results found. I have personally reviewed and evaluated these images and lab results as part of my medical decision-making.   EKG Interpretation None       Vital signs reviewed and are as follows: Filed Vitals:   09/26/14 0903  BP: 123/74  Pulse: 86  Temp: 98.2 F (36.8 C)  Resp: 20   Patient urged to return with worsening symptoms or other concerns. Patient verbalized understanding and agrees with plan.    MDM   Final diagnoses:  Insect bite   Patient with minor skin rash, most likely related to insect bites. Possible flea bite. No more widespread reaction noted. No systemic symptoms of illness. Topical treatments given history of diabetes.  I personally performed the services described in this documentation, which was scribed in my presence. The recorded information has been reviewed and is accurate.    Carlisle Cater, PA-C 09/26/14 [id number]  Merrily Pew, MD 09/27/14 [phone]

## 2014-09-26 NOTE — Discharge Instructions (Signed)
Please read and follow all provided instructions.  Your diagnoses today include:  1. Insect bite     Tests performed today include:  Vital signs. See below for your results today.   Medications prescribed:   Steroid cream - do not use on the face  Home care instructions:  Follow any educational materials contained in this packet.  Follow-up instructions: Please follow-up with your primary care provider as needed for further evaluation of your symptoms.  Return instructions:   Please return to the Emergency Department if you experience worsening symptoms.   Please return if you have any other emergent concerns.  Additional Information:  Your vital signs today were: BP 123/74 mmHg   Pulse 86   Temp(Src) 98.2 F (36.8 C) (Oral)   Resp 20   Ht 5\' 8"  (1.727 m)   Wt 259 lb (117.482 kg)   BMI 39.39 kg/m2   SpO2 98% If your blood pressure (BP) was elevated above 135/85 this visit, please have this repeated by your doctor within one month. ---------------

## 2014-09-26 NOTE — ED Notes (Signed)
PT reports a rash to body from unknown source.

## 2014-09-26 NOTE — ED Notes (Signed)
Declined W/C at D/C and was escorted to lobby by RN. 

## 2014-09-28 ENCOUNTER — Ambulatory Visit (INDEPENDENT_AMBULATORY_CARE_PROVIDER_SITE_OTHER): Payer: Federal, State, Local not specified - PPO | Admitting: Family

## 2014-09-28 ENCOUNTER — Encounter: Payer: Self-pay | Admitting: Family

## 2014-09-28 VITALS — BP 114/72 | HR 83 | Temp 98.4°F | Resp 18 | Wt 263.0 lb

## 2014-09-28 DIAGNOSIS — W57XXXA Bitten or stung by nonvenomous insect and other nonvenomous arthropods, initial encounter: Secondary | ICD-10-CM | POA: Diagnosis not present

## 2014-09-28 DIAGNOSIS — T148 Other injury of unspecified body region: Secondary | ICD-10-CM | POA: Diagnosis not present

## 2014-09-28 DIAGNOSIS — I255 Ischemic cardiomyopathy: Secondary | ICD-10-CM

## 2014-09-28 MED ORDER — BETAMETHASONE DIPROPIONATE 0.05 % EX CREA: TOPICAL_CREAM | Freq: Two times a day (BID) | CUTANEOUS | Status: DC

## 2014-09-28 NOTE — Assessment & Plan Note (Signed)
Symptoms and exam consistent with insect bites that are improving with betamethasone cream. Discussed potential for prednisone to help with itching, however given his diabetes requesting to stay with the creams and benedryl. Refill betamethasone cream. Follow up if symptoms worsen or fail to improve.

## 2014-09-28 NOTE — Patient Instructions (Signed)
Thank you for choosing [facility].  Summary/Instructions:  Your prescription(s) have been submitted to your pharmacy or been printed and provided for you. Please take as directed and contact our office if you believe you are having problem(s) with the medication(s) or have any questions.  If your symptoms worsen or fail to improve, please contact our office for further instruction, or in case of emergency go directly to the emergency room at the closest medical facility.   Insect Bite Mosquitoes, flies, fleas, bedbugs, and many other insects can bite. Insect bites are different from insect stings. A sting is when venom is injected into the skin. Some insect bites can transmit infectious diseases. SYMPTOMS  Insect bites usually turn red, swell, and itch for 2 to 4 days. They often go away on their own. TREATMENT  Your caregiver may prescribe antibiotic medicines if a bacterial infection develops in the bite. HOME CARE INSTRUCTIONS  Do not scratch the bite area.  Keep the bite area clean and dry. Wash the bite area thoroughly with soap and water.  Put ice or cool compresses on the bite area.  Put ice in a plastic bag.  Place a towel between your skin and the bag.  Leave the ice on for 20 minutes, 4 times a day for the first 2 to 3 days, or as directed.  You may apply a baking soda paste, cortisone cream, or calamine lotion to the bite area as directed by your caregiver. This can help reduce itching and swelling.  Only take over-the-counter or prescription medicines as directed by your caregiver.  If you are given antibiotics, take them as directed. Finish them even if you start to feel better. You may need a tetanus shot if:  You cannot remember when you had your last tetanus shot.  You have never had a tetanus shot.  The injury broke your skin. If you get a tetanus shot, your arm may swell, get red, and feel warm to the touch. This is common and not a problem. If you  need a tetanus shot and you choose not to have one, there is a rare chance of getting tetanus. Sickness from tetanus can be serious. SEEK IMMEDIATE MEDICAL CARE IF:   You have increased pain, redness, or swelling in the bite area.  You see a red line on the skin coming from the bite.  You have a fever.  You have joint pain.  You have a headache or neck pain.  You have unusual weakness.  You have a rash.  You have chest pain or shortness of breath.  You have abdominal pain, nausea, or vomiting.  You feel unusually tired or sleepy. MAKE SURE YOU:   Understand these instructions.  Will watch your condition.  Will get help right away if you are not doing well or get worse. Document Released: 02/03/2004 Document Revised: 03/20/2011 Document Reviewed: 07/27/2010 [facility] Patient Information 2015 [place], [place]. This information is not intended to replace advice given to you by your health care provider. Make sure you discuss any questions you have with your health care provider.

## 2014-09-28 NOTE — Progress Notes (Signed)
Subjective:    Patient ID: Henry [name], male    DOB: Apr 04, 1950, 64 y.o.   MRN: [id number]  Chief Complaint  Patient presents with  . Hospitalization Follow-up    went to the hospital for a rash that ended up all over his body from walking in the woods, does not know what bit him, there is itching still but found some relief with betamethasone    HPI:  Henry [name] is a 64 y.o. male who  has a past medical history of Cardiomyopathy; CAD (coronary artery disease); HTN (hypertension); MI (myocardial infarction); Sleep apnea; GERD (gastroesophageal reflux disease); Hyperthyroidism; Hyperplasia, prostate; Benign neoplasm of colon; Nephrolithiasis; Ventral hernia; HLD (hyperlipidemia); Parkinson disease; OSA (obstructive sleep apnea); Sleep apnea, organic; Complication of anesthesia; Arthritis; Shortness of breath; UTI (lower urinary tract infection) (09/15/12); Asthma; and DM (diabetes mellitus). and presents today for an office follow up.   Recently evaluated in the ED following being out in the woods where he developed a rash located on his bilateral feet, legs, abdomen and back. He was diagnosed with insect bites and treated with betamethasone cream.  Since leaving the ED he has experienced improvement in his symptoms but continues to experience itching and several spots located in various areas of his legs, back and abdomen remain. Modifying factors include Benedryl in addition to the Betamethasone. Requesting a refill of the betamethasone cream.  Allergies  Allergen Reactions  . Penicillins Rash    Because of a history of documented adverse serious drug reaction;Medi Alert bracelet  is recommended Has patient had a PCN reaction causing immediate rash, facial/tongue/throat swelling, SOB or lightheadedness with hypotension: Yes Has patient had a PCN reaction causing severe rash involving mucus membranes or skin necrosis: unknown Has patient had a PCN reaction that required  hospitalization NO Has patient had a PCN reaction occurring within the last 10 years: NO If all of the above answers are "N     Current Outpatient Prescriptions on File Prior to Visit  Medication Sig Dispense Refill  . acetaminophen (TYLENOL) 325 MG tablet Take 1-2 tablets (325-650 mg total) by mouth every 4 (four) hours as needed for mild pain.    Marland Kitchen albuterol (PROVENTIL HFA;VENTOLIN HFA) 108 (90 BASE) MCG/ACT inhaler Inhale 2 puffs into the lungs every 6 (six) hours as needed for wheezing or shortness of breath. 18 g 5  . aspirin EC 81 MG tablet Take 81 mg by mouth daily.    Marland Kitchen atorvastatin (LIPITOR) 40 MG tablet TAKE 1 TABLET (40 MG TOTAL) BY MOUTH DAILY. 90 tablet 3  . clonazePAM (KLONOPIN) 0.5 MG tablet TAKE 1 & 1/2 TABLETS AT BEDTIME (Patient taking differently: TAKE 1 & 1/2 TABLETS AT BEDTIME PRN Sleep) 135 tablet 1  . Fluticasone-Salmeterol (ADVAIR) 250-50 MCG/DOSE AEPB Inhale 1 puff into the lungs 2 (two) times daily as needed (for copd symptoms). 60 each 5  . furosemide (LASIX) 80 MG tablet Take 1.5 tablets by mouth daily. PT NEEDS TO BE SEEN IN OFFICE BEFORE FURTHER REFILLS 45 tablet 0  . glimepiride (AMARYL) 4 MG tablet 1 in am and 1/2 at supper 45 tablet 3  . ibuprofen (ADVIL,MOTRIN) 200 MG tablet Take 800 mg by mouth daily as needed for pain.    . Insulin Glargine (LANTUS SOLOSTAR) 100 UNIT/ML Solostar Pen Inject up to 20 units at night (Patient taking differently: Inject 26 Units into the skin at bedtime. ) 5 pen 3  . KLOR-CON M10 10 MEQ tablet TAKE 3 TABLETS BY  MOUTH DAILY 90 tablet 3  . levothyroxine (SYNTHROID, LEVOTHROID) 125 MCG tablet TAKE 1 TABLET EVERY DAY DAILY BEFORE BREAKFAST 90 tablet 1  . metFORMIN (GLUCOPHAGE) 1000 MG tablet Take 1 tablet (1,000 mg total) by mouth 2 (two) times daily with a meal. 180 tablet 3  . metoprolol succinate (TOPROL-XL) 50 MG 24 hr tablet TAKE 1 TABLET (50 MG TOTAL) BY MOUTH AT BEDTIME. TAKE WITH OR IMMEDIATELY FOLLOWING A MEAL. 30 tablet 3  .  Multiple Vitamin (MULTIVITAMIN WITH MINERALS) TABS tablet Take 1 tablet by mouth daily. Antioxidant  Multi vitamin    . nitroGLYCERIN (NITROSTAT) 0.4 MG SL tablet Place 1 tablet (0.4 mg total) under the tongue every 5 (five) minutes as needed for chest pain. 25 tablet 2  . pramipexole (MIRAPEX) 0.5 MG tablet Take 0.5 mg by mouth 2 (two) times daily.    Marland Kitchen spironolactone (ALDACTONE) 25 MG tablet Take 1 tablet (25 mg total) by mouth daily. 30 tablet 0  . SUPREP BOWEL PREP SOLN Take 1 kit by mouth once. 354 mL 0  . traMADol (ULTRAM) 50 MG tablet TAKE 1 TABLET TWICE A DAY AS NEED (Patient taking differently: Take 50 mg by mouth 2 (two) times daily as needed for moderate pain. ) 60 tablet 2  . travoprost, benzalkonium, (TRAVATAN) 0.004 % ophthalmic solution Place 1 drop into the right eye at bedtime.     . trihexyphenidyl (ARTANE) 2 MG tablet Take 1 tablet (2 mg total) by mouth 2 (two) times daily. 180 tablet 1  . VICTOZA 18 MG/3ML SOPN INJECT 1.8 MG ONCE A DAY 27 mL 1  . vitamin B-12 (CYANOCOBALAMIN) 1000 MCG tablet Take 1,000 mcg by mouth daily.     No current facility-administered medications on file prior to visit.     Past Surgical History  Procedure Laterality Date  . Coronary artery bypass graft  2000    [name], MD  . Coronary stent placement  1998  . Median sternotomy  2000  . Deep brain stimulator placement  2004    Right and left VIN stimulator placement   . Acoustic neuroma resection  1981  . Finger amputation      left pointer  . Cataract extraction w/ intraocular lens implant      Hx: of right eye  . Tonsillectomy    . Colonoscopy w/ biopsies and polypectomy      Hx: of  . Cardiac catheterization    . Subthalamic stimulator battery replacement N/A 09/05/2012    Procedure: Deep brain stimulator battery change;  Surgeon: Erline Levine, MD;  Location: [facility];  Service: Neurosurgery;  Laterality: N/A;  Deep brain stimulator battery change  . Left and right heart  catheterization with coronary angiogram N/A 09/24/2013    Procedure: LEFT AND RIGHT HEART CATHETERIZATION WITH CORONARY ANGIOGRAM;  Surgeon: Burnell Blanks, MD;  Location: [facility] CATH LAB;  Service: Cardiovascular;  Laterality: N/A;  . Implantable cardioverter defibrillator implant N/A 11/13/2013    Procedure: IMPLANTABLE CARDIOVERTER DEFIBRILLATOR IMPLANT;  Surgeon: Evans Lance, MD;  Location: [facility] LAB;  Service: Cardiovascular;  Laterality: N/A;    Review of Systems  Constitutional: Negative for fever and chills.  Skin: Positive for rash.      Objective:    BP 114/72 mmHg  Pulse 83  Temp(Src) 98.4 F (36.9 C) (Oral)  Resp 18  Wt 263 lb (119.296 kg)  SpO2 95% Nursing note and vital signs reviewed.  Physical Exam  Constitutional: He is oriented to  person, place, and time. He appears well-developed and well-nourished. No distress.  Cardiovascular: Normal rate, regular rhythm, normal heart sounds and intact distal pulses.   Pulmonary/Chest: Effort normal and breath sounds normal.  Neurological: He is alert and oriented to person, place, and time.  Skin: Skin is warm and dry.  Red, papular areas in sporadic patterns located on his legs, abdomen and back. No evidence of infection.   Psychiatric: He has a normal mood and affect. His behavior is normal. Judgment and thought content normal.       Assessment & Plan:   Problem List Items Addressed This Visit      Other   Insect bites - Primary    Symptoms and exam consistent with insect bites that are improving with betamethasone cream. Discussed potential for prednisone to help with itching, however given his diabetes requesting to stay with the creams and benedryl. Refill betamethasone cream. Follow up if symptoms worsen or fail to improve.       Relevant Medications   betamethasone dipropionate (DIPROLENE) 0.05 % cream

## 2014-09-29 ENCOUNTER — Encounter (HOSPITAL_COMMUNITY): Payer: Self-pay | Admitting: *Deleted

## 2014-10-01 ENCOUNTER — Ambulatory Visit (INDEPENDENT_AMBULATORY_CARE_PROVIDER_SITE_OTHER): Payer: Federal, State, Local not specified - PPO | Admitting: Endocrinology

## 2014-10-01 VITALS — BP 138/72 | HR 83 | Temp 98.5°F | Resp 16 | Ht 68.0 in | Wt 260.2 lb

## 2014-10-01 DIAGNOSIS — IMO0002 Reserved for concepts with insufficient information to code with codable children: Secondary | ICD-10-CM

## 2014-10-01 DIAGNOSIS — Z23 Encounter for immunization: Secondary | ICD-10-CM | POA: Diagnosis not present

## 2014-10-01 DIAGNOSIS — I255 Ischemic cardiomyopathy: Secondary | ICD-10-CM

## 2014-10-01 DIAGNOSIS — E1165 Type 2 diabetes mellitus with hyperglycemia: Secondary | ICD-10-CM | POA: Diagnosis not present

## 2014-10-01 NOTE — Patient Instructions (Signed)
Click once for breakfast and supper  Check blood sugars on waking up daily Also check blood sugars about 2 hours after a meal and do this after different meals by rotation Recommended blood sugar levels on waking up is 90-130 and about 2 hours after meal is 140-180 Please bring blood sugar monitor to each visit.   Dont take lantus on Sunday pm and start V-go then  For now 28 Lantus, no more when using Victoza

## 2014-10-01 NOTE — Progress Notes (Signed)
Patient ID: Henry [name], male   DOB: [birth year], 64 y.o.   MRN: [id number]           Reason for Appointment: Follow-up for Type 2 Diabetes  Referring physician: Linna Darner  History of Present Illness:          Diagnosis: Type 2 diabetes mellitus, date of diagnosis: 2000      Past history:   Patient thinks he has been taking Amaryl for several years and probably metformin since onset also At some point he was changed from metformin to [facility] and was taking this since at least 2012 A1c had been higher in 2015 Since his A1c had been progressively higher with his regimen of Janumet and Amaryl he was started on Victoza in 01/2014 He was started on Lantus insulin in 5/16 because of persistent hyperglycemia especially fasting; was having readings as high as 293  Recent history:   LANTUS dosage: 26 units pm  His A1c was relatively better at 7.2 in 8/60 He is on regimen of metformin, basal insulin, Amaryl and Victoza 1.8 mg His weight has also been difficult to control Previously had a tendency to persistently high blood sugars throughout the day but more so towards the late afternoon and evenings, glucose on the last visit was averaging 170 Despite increasing his Lantus the has not had adequate control He agreed to start the V-go pump but has not done so despite getting instructed on it since he had some Lantus left over  Current blood sugar patterns and problems identified:  His blood sugars have been variable although on some days as low as 120  He has done very few readings after meals despite instructions but has only a couple of readings high in the afternoon and none late in the evening  He thinks he is eating relatively more at dinnertime and has a small late lunch, eating breakfast at different times of the day  He has taken only 1.2 mg Victoza daily because of high cost of 1.8 mg  He has  seen the dietitian in 3/16 Not able to exercise much to be effective in weight loss      Oral hypoglycemic drugs the patient is taking are: Metformin 1 g twice a day, Amaryl  4 mg a.m. and 2 mg p.m.   Side effects from medications have been: None Compliance with the medical regimen: Fair  Hypoglycemia:   none  Glucose monitoring:  done 1-2 times  a day         Glucometer: One Touch.      Blood Glucose readings by download recently; overall average is for the last 4 weeks  Mean values apply above for all meters except median for One Touch  PRE-MEAL Fasting  midday   afternoon  Bedtime Overall  Glucose range:  120-207   119-156   122-230   139, 155    Mean/median:  162      156+/-28      Self-care: The diet that the patient has been following is:  tries to eat more vegetables, .        Meals: 8am , 2-3 pm and 6-7 pm    Exercise: unable to do any         Dietician visit, most recent: 03/2014                Weight history: Previous range: 266-277  Wt Readings from Last 3 Encounters:  10/01/14 260 lb 3.2 oz (118.026 kg)  09/28/14 263 lb (119.296 kg)  09/26/14 259 lb (117.482 kg)    Glycemic control:   Lab Results  Component Value Date   HGBA1C 7.2* 08/20/2014   HGBA1C 7.8* 05/19/2014   HGBA1C 9.7* 01/13/2014   Lab Results  Component Value Date   MICROALBUR 0.9 01/13/2014   LDLCALC 126* 04/08/2013   CREATININE 1.13 08/20/2014         Medication List       This list is accurate as of: 10/01/14  3:15 PM.  Always use your most recent med list.               acetaminophen 325 MG tablet  Commonly known as:  TYLENOL  Take 1-2 tablets (325-650 mg total) by mouth every 4 (four) hours as needed for mild pain.     albuterol 108 (90 BASE) MCG/ACT inhaler  Commonly known as:  PROVENTIL HFA;VENTOLIN HFA  Inhale 2 puffs into the lungs every 6 (six) hours as needed for wheezing or shortness of breath.     aspirin EC 81 MG tablet  Take 81 mg by mouth daily.     atorvastatin 40 MG tablet  Commonly known as:  LIPITOR  TAKE 1 TABLET (40 MG TOTAL) BY MOUTH  DAILY.     betamethasone dipropionate 0.05 % cream  Commonly known as:  DIPROLENE  Apply topically 2 (two) times daily.     clonazePAM 0.5 MG tablet  Commonly known as:  KLONOPIN  TAKE 1 & 1/2 TABLETS AT BEDTIME     Fluticasone-Salmeterol 250-50 MCG/DOSE Aepb  Commonly known as:  ADVAIR  Inhale 1 puff into the lungs 2 (two) times daily as needed (for copd symptoms).     furosemide 80 MG tablet  Commonly known as:  LASIX  Take 1.5 tablets by mouth daily. PT NEEDS TO BE SEEN IN OFFICE BEFORE FURTHER REFILLS     glimepiride 4 MG tablet  Commonly known as:  AMARYL  1 in am and 1/2 at supper     ibuprofen 200 MG tablet  Commonly known as:  ADVIL,MOTRIN  Take 800 mg by mouth daily as needed for pain.     Insulin Glargine 100 UNIT/ML Solostar Pen  Commonly known as:  LANTUS SOLOSTAR  Inject up to 20 units at night     KLOR-CON M10 10 MEQ tablet  Generic drug:  potassium chloride  TAKE 3 TABLETS BY MOUTH DAILY     levothyroxine 125 MCG tablet  Commonly known as:  SYNTHROID, LEVOTHROID  TAKE 1 TABLET EVERY DAY DAILY BEFORE BREAKFAST     metFORMIN 1000 MG tablet  Commonly known as:  GLUCOPHAGE  Take 1 tablet (1,000 mg total) by mouth 2 (two) times daily with a meal.     metoprolol succinate 50 MG 24 hr tablet  Commonly known as:  TOPROL-XL  TAKE 1 TABLET (50 MG TOTAL) BY MOUTH AT BEDTIME. TAKE WITH OR IMMEDIATELY FOLLOWING A MEAL.     multivitamin with minerals Tabs tablet  Take 1 tablet by mouth daily. Antioxidant  Multi vitamin     nitroGLYCERIN 0.4 MG SL tablet  Commonly known as:  NITROSTAT  Place 1 tablet (0.4 mg total) under the tongue every 5 (five) minutes as needed for chest pain.     pramipexole 0.5 MG tablet  Commonly known as:  MIRAPEX  Take 0.5 mg by mouth 2 (two) times daily.     spironolactone 25 MG tablet  Commonly known as:  ALDACTONE  Take 1 tablet (  25 mg total) by mouth daily.     SUPREP BOWEL PREP Soln  Generic drug:  Na Sulfate-K Sulfate-Mg  Sulf  Take 1 kit by mouth once.     traMADol 50 MG tablet  Commonly known as:  ULTRAM  TAKE 1 TABLET TWICE A DAY AS NEED     travoprost (benzalkonium) 0.004 % ophthalmic solution  Commonly known as:  TRAVATAN  Place 1 drop into the right eye at bedtime.     trihexyphenidyl 2 MG tablet  Commonly known as:  ARTANE  Take 1 tablet (2 mg total) by mouth 2 (two) times daily.     VICTOZA 18 MG/3ML Sopn  Generic drug:  Liraglutide  INJECT 1.8 MG ONCE A DAY     vitamin B-12 1000 MCG tablet  Commonly known as:  CYANOCOBALAMIN  Take 1,000 mcg by mouth daily.        Allergies:  Allergies  Allergen Reactions  . Penicillins Rash    Because of a history of documented adverse serious drug reaction;Medi Alert bracelet  is recommended Has patient had a PCN reaction causing immediate rash, facial/tongue/throat swelling, SOB or lightheadedness with hypotension: Yes Has patient had a PCN reaction causing severe rash involving mucus membranes or skin necrosis: unknown Has patient had a PCN reaction that required hospitalization NO Has patient had a PCN reaction occurring within the last 10 years: NO If all of the above answers are "N    Past Medical History  Diagnosis Date  . Cardiomyopathy   . CAD (coronary artery disease)   . HTN (hypertension)     pt denies 08/19/12  . GERD (gastroesophageal reflux disease)   . Hyperplasia, prostate   . Benign neoplasm of colon   . Nephrolithiasis   . Ventral hernia   . HLD (hyperlipidemia)   . Parkinson disease     1999  . Complication of anesthesia     pt states that he got a rash  . Shortness of breath     Hx: of at all times  . UTI (lower urinary tract infection) 09/15/12    Klebsiella  . Asthma   . DM (diabetes mellitus)     TYPE 2   . Hyperthyroidism     thyroid lobectomy  . Arthritis     cane  . AICD (automatic cardioverter/defibrillator) present     Dr Lovena Le office visit yearly  . 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 Sleep apnea   . OSA (obstructive sleep apnea)     AHI-28,on CPAP, noncompliant with CPAP  . Sleep apnea, organic   . MI (myocardial infarction)     Dr Stanford Breed 2000, [id number] bypass    Past Surgical History  Procedure Laterality Date  . Coronary artery bypass graft  2000    [name], MD  . Coronary stent placement  1998  . Median sternotomy  2000  . Deep brain stimulator placement  2004    Right and left VIN stimulator placement (parkinsons)  . 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 Finger amputation      left pointer  . Cataract extraction w/ intraocular lens implant      Hx: of right eye  . Tonsillectomy    . Colonoscopy w/ biopsies and polypectomy      Hx: of  . Cardiac catheterization    . Subthalamic stimulator battery replacement N/A 09/05/2012    Procedure: Deep brain  stimulator battery change;  Surgeon: Erline Levine, MD;  Location: [facility];  Service: Neurosurgery;  Laterality: N/A;  Deep brain stimulator battery change  . Left and right heart catheterization with coronary angiogram N/A 09/24/2013    Procedure: LEFT AND RIGHT HEART CATHETERIZATION WITH CORONARY ANGIOGRAM;  Surgeon: Burnell Blanks, MD;  Location: [facility] CATH LAB;  Service: Cardiovascular;  Laterality: N/A;  . Implantable cardioverter defibrillator implant N/A 11/13/2013    Procedure: IMPLANTABLE CARDIOVERTER DEFIBRILLATOR IMPLANT;  Surgeon: Evans Lance, MD;  Location: [facility] CATH LAB;  Service: Cardiovascular;  Laterality: N/A;  . Eye surgery    . Insert / replace / remove pacemaker      Family History  Problem Relation Age of Onset  . Peripheral vascular disease    . Arthritis    . Alcoholism Mother 9    deceased   . Aneurysm Father 53  . Autoimmune disease Brother 46    AIDS  . Diabetes Neg Hx   . Heart disease Neg Hx   . Colon cancer Neg Hx     Social History:  reports that he has never smoked. He has never used smokeless tobacco. He reports  that he drinks alcohol. He reports that he does not use illicit drugs.    Review of Systems     Most recent eye exam was 8/15       Lipids: On Lipitor, tends to have high triglycerides and low HDL      Lab Results  Component Value Date   CHOL 147 05/19/2014   HDL 35.50* 05/19/2014   LDLCALC 126* 04/08/2013   LDLDIRECT 86.0 05/19/2014   TRIG 213.0* 05/19/2014   CHOLHDL 4 05/19/2014                  Thyroid:   He has had hypothyroidism following radioactive iodine treatment for hyperthyroidism in 2013  TSH is improved  with dosage of 125 g   Lab Results  Component Value Date   TSH 3.57 08/20/2014   TSH 4.60* 01/13/2014   TSH 10.27* 08/06/2013   FREET4 0.82 08/20/2014   FREET4 0.98 11/20/2011   FREET4 0.91 10/16/2011       The blood pressure has been followed by [facility], currently taking losartan  Diabetic foot exam in 1/16 showed shows normal monofilament sensation in the toes except decreased on the right great toe and plantar surfaces, no skin lesions or ulcers on the feet and normal pedal pulses    Physical Examination:  BP 138/72 mmHg  Pulse 83  Temp(Src) 98.5 F (36.9 C)  Resp 16  Ht $R'5\' 8"'bC$  (1.727 m)  Wt 260 lb 3.2 oz (118.026 kg)  BMI 39.57 kg/m2  SpO2 95%     ASSESSMENT:  Diabetes type 2, uncontrolled with obesity    See history of present illness for detailed discussion of his current management, blood sugar patterns and problems identified  He still has inconsistent control with Lantus insulin taken at bedtime and variable readings in the mornings which are difficult to explain. Again has not done postprandial readings as discussed and not clear if his blood sugars are higher after breakfast or evening meal Has a couple of readings late in the evening which are fairly good  He has been trained on the V-go pump and his foot hopefully improve his control more consistently, currently since he is requiring about 28 units he may be related to get by with  the 20 unit basal pump  He is  unable to exercise and this is a limiting factor in his weight loss  PLAN:   He will increase his Lantus by 2 units  Start using the V-go pump on Sunday, he is going to use Humalog insulin for now.  His wife will help him with this  Empirically start mealtime coverage with 2 units for his breakfast and evening meal and adjust this further based on his postprandial readings  Continue Victoza, metformin and probably may be able to stop Amaryl  Answered all questions about using the pump and he will call if he has any problems   There are no Patient Instructions on file for this visit.   Counseling time on subjects discussed above is over 50% of today's 25 minute visit  KUMAR,AJAY 10/01/2014, 3:15 PM   Note: This office note was prepared with Estate agent. Any transcriptional errors that result from this process are unintentional.

## 2014-10-02 ENCOUNTER — Other Ambulatory Visit: Payer: Self-pay | Admitting: Internal Medicine

## 2014-10-02 ENCOUNTER — Other Ambulatory Visit: Payer: Self-pay | Admitting: Emergency Medicine

## 2014-10-02 MED ORDER — FUROSEMIDE 80 MG PO TABS: ORAL_TABLET | ORAL | Status: DC

## 2014-10-04 ENCOUNTER — Other Ambulatory Visit: Payer: Self-pay | Admitting: Endocrinology

## 2014-10-05 ENCOUNTER — Other Ambulatory Visit: Payer: Self-pay | Admitting: Internal Medicine

## 2014-10-06 ENCOUNTER — Other Ambulatory Visit: Payer: Self-pay | Admitting: Emergency Medicine

## 2014-10-06 MED ORDER — FUROSEMIDE 80 MG PO TABS: ORAL_TABLET | ORAL | Status: DC

## 2014-10-08 ENCOUNTER — Other Ambulatory Visit: Payer: Self-pay | Admitting: *Deleted

## 2014-10-08 ENCOUNTER — Telehealth: Payer: Self-pay | Admitting: Endocrinology

## 2014-10-08 MED ORDER — V-GO 20 KIT: PACK | Status: DC

## 2014-10-08 NOTE — Telephone Encounter (Signed)
Please call in rx for vgo to [facility]

## 2014-10-08 NOTE — Telephone Encounter (Signed)
rx sent

## 2014-10-09 DIAGNOSIS — M5137 Other intervertebral disc degeneration, lumbosacral region: Secondary | ICD-10-CM | POA: Diagnosis not present

## 2014-10-09 DIAGNOSIS — M47817 Spondylosis without myelopathy or radiculopathy, lumbosacral region: Secondary | ICD-10-CM | POA: Diagnosis not present

## 2014-10-12 ENCOUNTER — Other Ambulatory Visit: Payer: Self-pay

## 2014-10-12 DIAGNOSIS — I255 Ischemic cardiomyopathy: Secondary | ICD-10-CM

## 2014-10-12 MED ORDER — SPIRONOLACTONE 25 MG PO TABS: 25.0000 mg | ORAL_TABLET | Freq: Every day | ORAL | Status: DC

## 2014-10-12 NOTE — Telephone Encounter (Signed)
Lelon Perla, MD at 11/24/2013 7:53 AM  spironolactone (ALDACTONE) 25 MG tabletTake 1 tablet (25 mg total) by mouth daily Chronic systolic CHF (congestive heart failure) - Lelon Perla, MD at 11/28/2013 3:54 PM     Status: Written Related Problem: Chronic systolic CHF (congestive heart failure)   Expand All Collapse All   Euvolemic on examination. Continue present dose of diuretics

## 2014-10-13 ENCOUNTER — Encounter (HOSPITAL_COMMUNITY)
Admission: RE | Disposition: A | Discharge status: Home / Self Care | Payer: Self-pay | Source: Ambulatory Visit | Attending: Internal Medicine

## 2014-10-13 ENCOUNTER — Ambulatory Visit (HOSPITAL_COMMUNITY)
Admission: RE | Admit: 2014-10-13 | Discharge: 2014-10-13 | Disposition: A | Discharge status: Home / Self Care | Payer: Federal, State, Local not specified - PPO | Source: Ambulatory Visit | Attending: Internal Medicine | Admitting: Internal Medicine

## 2014-10-13 ENCOUNTER — Ambulatory Visit (HOSPITAL_COMMUNITY): Payer: Federal, State, Local not specified - PPO | Admitting: Anesthesiology

## 2014-10-13 ENCOUNTER — Telehealth: Payer: Self-pay | Admitting: Endocrinology

## 2014-10-13 ENCOUNTER — Other Ambulatory Visit: Payer: Self-pay | Admitting: *Deleted

## 2014-10-13 ENCOUNTER — Encounter (HOSPITAL_COMMUNITY): Payer: Self-pay

## 2014-10-13 DIAGNOSIS — E669 Obesity, unspecified: Secondary | ICD-10-CM | POA: Diagnosis not present

## 2014-10-13 DIAGNOSIS — I2510 Atherosclerotic heart disease of native coronary artery without angina pectoris: Secondary | ICD-10-CM | POA: Insufficient documentation

## 2014-10-13 DIAGNOSIS — D122 Benign neoplasm of ascending colon: Secondary | ICD-10-CM | POA: Diagnosis not present

## 2014-10-13 DIAGNOSIS — D123 Benign neoplasm of transverse colon: Secondary | ICD-10-CM | POA: Diagnosis not present

## 2014-10-13 DIAGNOSIS — J45909 Unspecified asthma, uncomplicated: Secondary | ICD-10-CM | POA: Insufficient documentation

## 2014-10-13 DIAGNOSIS — D125 Benign neoplasm of sigmoid colon: Secondary | ICD-10-CM | POA: Diagnosis not present

## 2014-10-13 DIAGNOSIS — I429 Cardiomyopathy, unspecified: Secondary | ICD-10-CM | POA: Insufficient documentation

## 2014-10-13 DIAGNOSIS — Z89022 Acquired absence of left finger(s): Secondary | ICD-10-CM | POA: Insufficient documentation

## 2014-10-13 DIAGNOSIS — Z951 Presence of aortocoronary bypass graft: Secondary | ICD-10-CM | POA: Diagnosis not present

## 2014-10-13 DIAGNOSIS — K635 Polyp of colon: Secondary | ICD-10-CM

## 2014-10-13 DIAGNOSIS — Z794 Long term (current) use of insulin: Secondary | ICD-10-CM | POA: Diagnosis not present

## 2014-10-13 DIAGNOSIS — G4733 Obstructive sleep apnea (adult) (pediatric): Secondary | ICD-10-CM | POA: Insufficient documentation

## 2014-10-13 DIAGNOSIS — I509 Heart failure, unspecified: Secondary | ICD-10-CM | POA: Insufficient documentation

## 2014-10-13 DIAGNOSIS — E0590 Thyrotoxicosis, unspecified without thyrotoxic crisis or storm: Secondary | ICD-10-CM | POA: Insufficient documentation

## 2014-10-13 DIAGNOSIS — Z95810 Presence of automatic (implantable) cardiac defibrillator: Secondary | ICD-10-CM | POA: Diagnosis not present

## 2014-10-13 DIAGNOSIS — G20 Parkinson's disease: Secondary | ICD-10-CM | POA: Insufficient documentation

## 2014-10-13 DIAGNOSIS — D120 Benign neoplasm of cecum: Secondary | ICD-10-CM | POA: Diagnosis not present

## 2014-10-13 DIAGNOSIS — I252 Old myocardial infarction: Secondary | ICD-10-CM | POA: Insufficient documentation

## 2014-10-13 DIAGNOSIS — E119 Type 2 diabetes mellitus without complications: Secondary | ICD-10-CM | POA: Diagnosis not present

## 2014-10-13 DIAGNOSIS — K219 Gastro-esophageal reflux disease without esophagitis: Secondary | ICD-10-CM | POA: Insufficient documentation

## 2014-10-13 DIAGNOSIS — E785 Hyperlipidemia, unspecified: Secondary | ICD-10-CM | POA: Insufficient documentation

## 2014-10-13 DIAGNOSIS — K648 Other hemorrhoids: Secondary | ICD-10-CM | POA: Insufficient documentation

## 2014-10-13 DIAGNOSIS — Z860100 Personal history of colon polyps, unspecified: Secondary | ICD-10-CM | POA: Insufficient documentation

## 2014-10-13 DIAGNOSIS — Z6839 Body mass index (BMI) 39.0-39.9, adult: Secondary | ICD-10-CM | POA: Insufficient documentation

## 2014-10-13 DIAGNOSIS — I10 Essential (primary) hypertension: Secondary | ICD-10-CM | POA: Diagnosis not present

## 2014-10-13 DIAGNOSIS — D126 Benign neoplasm of colon, unspecified: Secondary | ICD-10-CM | POA: Diagnosis not present

## 2014-10-13 DIAGNOSIS — D127 Benign neoplasm of rectosigmoid junction: Secondary | ICD-10-CM | POA: Insufficient documentation

## 2014-10-13 DIAGNOSIS — Z79899 Other long term (current) drug therapy: Secondary | ICD-10-CM | POA: Insufficient documentation

## 2014-10-13 DIAGNOSIS — K6289 Other specified diseases of anus and rectum: Secondary | ICD-10-CM | POA: Insufficient documentation

## 2014-10-13 DIAGNOSIS — Z1211 Encounter for screening for malignant neoplasm of colon: Secondary | ICD-10-CM | POA: Diagnosis not present

## 2014-10-13 HISTORY — DX: Unspecified hearing loss, unspecified ear: H91.90

## 2014-10-13 HISTORY — DX: Presence of automatic (implantable) cardiac defibrillator: Z95.810

## 2014-10-13 HISTORY — PX: COLONOSCOPY: SHX5424

## 2014-10-13 LAB — GLUCOSE, CAPILLARY: GLUCOSE-CAPILLARY: 102 mg/dL — AB (ref 65–99)

## 2014-10-13 SURGERY — COLONOSCOPY
Anesthesia: Monitor Anesthesia Care

## 2014-10-13 MED ORDER — SODIUM CHLORIDE 0.9 % IV SOLN: INTRAVENOUS | Status: DC

## 2014-10-13 MED ORDER — PROPOFOL 10 MG/ML IV BOLUS
INTRAVENOUS | Status: AC
  Filled 2014-10-13: qty 20

## 2014-10-13 MED ORDER — PROPOFOL 500 MG/50ML IV EMUL
INTRAVENOUS | Status: DC | PRN
  Administered 2014-10-13: 100 ug/kg/min via INTRAVENOUS

## 2014-10-13 MED ORDER — INSULIN LISPRO 100 UNIT/ML ~~LOC~~ SOLN: SUBCUTANEOUS | Status: DC

## 2014-10-13 MED ORDER — LACTATED RINGERS IV SOLN
INTRAVENOUS | Status: DC
  Administered 2014-10-13: 1000 mL via INTRAVENOUS

## 2014-10-13 NOTE — Anesthesia Postprocedure Evaluation (Signed)
  Anesthesia Post-op Note  Patient: Henry [name]  Procedure(s) Performed: Procedure(s) (LRB): COLONOSCOPY (N/A)  Patient Location: PACU  Anesthesia Type: MAC  Level of Consciousness: awake and alert   Airway and Oxygen Therapy: Patient Spontanous Breathing  Post-op Pain: mild  Post-op Assessment: Post-op Vital signs reviewed, Patient's Cardiovascular Status Stable, Respiratory Function Stable, Patent Airway and No signs of Nausea or vomiting  Last Vitals:  Filed Vitals:   10/13/14 0930  BP: 120/81  Pulse:   Temp:   Resp:     Post-op Vital Signs: stable   Complications: No apparent anesthesia complications

## 2014-10-13 NOTE — Anesthesia Preprocedure Evaluation (Addendum)
Anesthesia Evaluation  Patient identified by MRN, date of birth, ID band Patient awake    Reviewed: Allergy & Precautions, NPO status , Patient's Chart, lab work & pertinent test results  Airway Mallampati: II  TM Distance: >3 FB Neck ROM: Full    Dental no notable dental hx.    Pulmonary asthma , sleep apnea ,    Pulmonary exam normal breath sounds clear to auscultation       Cardiovascular hypertension, Pt. on medications + CAD, + Past MI, + CABG (2000) and +CHF  Normal cardiovascular exam+ Cardiac Defibrillator  Rhythm:Regular Rate:Normal  EF 30-35%   Neuro/Psych Parkinson's negative neurological ROS  negative psych ROS   GI/Hepatic negative GI ROS, Neg liver ROS,   Endo/Other  diabetes, Type 2, Oral Hypoglycemic Agents, Insulin Dependent  Renal/GU negative Renal ROS  negative genitourinary   Musculoskeletal negative musculoskeletal ROS (+)   Abdominal   Peds negative pediatric ROS (+)  Hematology negative hematology ROS (+)   Anesthesia Other Findings   Reproductive/Obstetrics negative OB ROS                            Anesthesia Physical Anesthesia Plan  ASA: III  Anesthesia Plan: MAC   Post-op Pain Management:    Induction:   Airway Management Planned: Simple Face Mask  Additional Equipment:   Intra-op Plan:   Post-operative Plan:   Informed Consent: I have reviewed the patients History and Physical, chart, labs and discussed the procedure including the risks, benefits and alternatives for the proposed anesthesia with the patient or authorized representative who has indicated his/her understanding and acceptance.   Dental advisory given  Plan Discussed with: CRNA  Anesthesia Plan Comments:         Anesthesia Quick Evaluation

## 2014-10-13 NOTE — H&P (Signed)
HPI: Henry [name] is a 64 year old male with multiple medical issues including CAD, cardiomyopathy with ICD, OSA, hypertension, hyperlipidemia, diabetes, and remote history of colon polyps who presents for screening/surveillance colonoscopy at the request of his primary care doctor. Due to his history of heart myopathy he is seen in the outpatient hospital setting for monitored anesthesia care. He reports that he is had issues with constipation but denies seeing rectal bleeding or melena. He does use Dulcolax and other over-the-counter laxative. He also uses an herbal tea which he says helps with his constipation. He denies abdominal pain. Denies chest pain or change in respiratory status recently.  Denies fam hx of CRC.  Colonoscopy in chart from 1999 read as essential normal, though there is comment about poor prep and adenomatous appearing polyp removed from transverse colon  Past Medical History  Diagnosis Date  . Cardiomyopathy   . CAD (coronary artery disease)   . HTN (hypertension)     pt denies 08/19/12  . GERD (gastroesophageal reflux disease)   . Hyperplasia, prostate   . Benign neoplasm of colon   . Nephrolithiasis   . Ventral hernia   . HLD (hyperlipidemia)   . Parkinson disease ([place])     1999  . Complication of anesthesia     pt states that he got a rash  . Shortness of breath     Hx: of at all times  . UTI (lower urinary tract infection) 09/15/12    Klebsiella  . Asthma   . DM (diabetes mellitus) ([place])     TYPE 2   . Hyperthyroidism     thyroid lobectomy  . Arthritis     cane  . AICD (automatic cardioverter/defibrillator) present     Dr Lovena Le office visit yearly  . 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 Sleep apnea   . OSA (obstructive sleep apnea)     AHI-28,on CPAP, noncompliant with CPAP  . Sleep apnea, organic   . MI (myocardial infarction) [facility])     Dr Stanford Breed 2000, x3vessels bypass    Past Surgical History  Procedure Laterality  Date  . Coronary artery bypass graft  2000    [name], MD  . Coronary stent placement  1998  . Median sternotomy  2000  . Deep brain stimulator placement  2004    Right and left VIN stimulator placement (parkinsons)  . 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 Finger amputation      left pointer  . Cataract extraction w/ intraocular lens implant      Hx: of right eye  . Tonsillectomy    . Colonoscopy w/ biopsies and polypectomy      Hx: of  . Cardiac catheterization    . Subthalamic stimulator battery replacement N/A 09/05/2012    Procedure: Deep brain stimulator battery change;  Surgeon: Erline Levine, MD;  Location: [place] NEURO ORS;  Service: Neurosurgery;  Laterality: N/A;  Deep brain stimulator battery change  . Left and right heart catheterization with coronary angiogram N/A 09/24/2013    Procedure: LEFT AND RIGHT HEART CATHETERIZATION WITH CORONARY ANGIOGRAM;  Surgeon: Burnell Blanks, MD;  Location: [facility] CATH LAB;  Service: Cardiovascular;  Laterality: N/A;  . Implantable cardioverter defibrillator implant N/A 11/13/2013    Procedure: IMPLANTABLE CARDIOVERTER DEFIBRILLATOR IMPLANT;  Surgeon: Evans Lance, MD;  Location: [facility] CATH LAB;  Service: Cardiovascular;  Laterality: N/A;  . Eye surgery    .  Insert / replace / remove pacemaker       (Not in an outpatient encounter)  Allergies  Allergen Reactions  . Penicillins Rash    Because of a history of documented adverse serious drug reaction;Medi Alert bracelet  is recommended Has patient had a PCN reaction causing immediate rash, facial/tongue/throat swelling, SOB or lightheadedness with hypotension: Yes Has patient had a PCN reaction causing severe rash involving mucus membranes or skin necrosis: unknown Has patient had a PCN reaction that required hospitalization NO Has patient had a PCN reaction occurring within the last 10 years: NO If all of the above answers are "N    Family History  Problem  Relation Age of Onset  . Peripheral vascular disease    . Arthritis    . Alcoholism Mother 2    deceased   . Aneurysm Father 59  . Autoimmune disease Brother 83    AIDS  . Diabetes Neg Hx   . Heart disease Neg Hx   . Colon cancer Neg Hx     Social History  Substance Use Topics  . Smoking status: Never Smoker   . Smokeless tobacco: Never Used  . Alcohol Use: Yes     Comment: occasional wine    ROS: As per history of present illness, otherwise negative  BP 112/60 mmHg  Pulse 78  Temp(Src) 97.6 F (36.4 C) (Oral)  Resp 21  Ht 5\' 8"  (1.727 m)  Wt 260 lb (117.935 kg)  BMI 39.54 kg/m2  SpO2 98% Gen: awake, alert, NAD HEENT: anicteric, op clear CV: RRR, no mrg, icd in place Pulm: CTA b/l Abd: soft, obese, NT/ND, +BS throughout Ext: no c/c/e Neuro: nonfocal   RELEVANT LABS AND IMAGING: CBC    Component Value Date/Time   WBC 6.9 11/10/2013 0744   RBC 4.52 11/10/2013 0744   HGB 12.8* 11/10/2013 0744   HCT 39.5 11/10/2013 0744   PLT 152.0 11/10/2013 0744   MCV 87.4 11/10/2013 0744   MCH 27.8 05/12/2013 1205   MCHC 32.4 11/10/2013 0744   RDW 14.9 11/10/2013 0744   LYMPHSABS 1.7 11/10/2013 0744   MONOABS 0.5 11/10/2013 0744   EOSABS 0.3 11/10/2013 0744   BASOSABS 0.0 11/10/2013 0744    CMP     Component Value Date/Time   NA 141 08/20/2014 1527   K 3.6 08/20/2014 1527   CL 99 08/20/2014 1527   CO2 31 08/20/2014 1527   GLUCOSE 152* 08/20/2014 1527   BUN 17 08/20/2014 1527   CREATININE 1.13 08/20/2014 1527   CREATININE 1.04 06/05/2013 1020   CALCIUM 9.3 08/20/2014 1527   PROT 6.9 08/20/2014 1527   ALBUMIN 4.2 08/20/2014 1527   AST 13 08/20/2014 1527   ALT 21 08/20/2014 1527   ALKPHOS 69 08/20/2014 1527   BILITOT 0.4 08/20/2014 1527   GFRNONAA 88* 05/13/2013 0540   GFRAA >90 05/13/2013 0540    ASSESSMENT/PLAN: 64 year old male with multiple medical issues including CAD, cardiomyopathy with ICD, OSA, hypertension, hyperlipidemia, diabetes, and remote  history of colon polyps who presents for screening/surveillance colonoscopy at the request of his primary care doctor.  1. CRC screening/hx of colonic polyps -- here for surveillance colonoscopy. The nature of the procedure, as well as the risks, benefits, and alternatives were carefully and thoroughly reviewed with the patient. Ample time for discussion and questions allowed. The patient understood, was satisfied, and agreed to proceed.

## 2014-10-13 NOTE — Transfer of Care (Signed)
Immediate Anesthesia Transfer of Care Note  Patient: Henry [name]  Procedure(s) Performed: Procedure(s): COLONOSCOPY (N/A)  Patient Location: PACU  Anesthesia Type:MAC  Level of Consciousness: awake, alert , oriented and patient cooperative  Airway & Oxygen Therapy: Patient Spontanous Breathing and Patient connected to face mask oxygen  Post-op Assessment: Report given to RN, Post -op Vital signs reviewed and stable and Patient moving all extremities X 4  Post vital signs: stable  Last Vitals:  Filed Vitals:   10/13/14 0725  BP: 112/60  Pulse: 78  Temp: 36.4 C  Resp: 21    Complications: No apparent anesthesia complications

## 2014-10-13 NOTE — Telephone Encounter (Signed)
rx sent

## 2014-10-13 NOTE — Discharge Instructions (Signed)
Avoid NSAIDs for 10-14 days.  YOU HAD AN ENDOSCOPIC PROCEDURE TODAY: Refer to the procedure report that was given to you for any specific questions about what was found during the examination.  If the procedure report does not answer your questions, please call your gastroenterologist to clarify.  YOU SHOULD EXPECT: Some feelings of bloating in the abdomen. Passage of more gas than usual.  Walking can help get rid of the air that was put into your GI tract during the procedure and reduce the bloating. If you had a lower endoscopy (such as a colonoscopy or flexible sigmoidoscopy) you may notice spotting of blood in your stool or on the toilet paper.   DIET: Your first meal following the procedure should be a light meal and then it is ok to progress to your normal diet.  A half-sandwich or bowl of soup is an example of a good first meal.  Heavy or fried foods are harder to digest and may make you feel nasueas or bloated.  Drink plenty of fluids but you should avoid alcoholic beverages for 24 hours.  ACTIVITY: Your care partner should take you home directly after the procedure.  You should plan to take it easy, moving slowly for the rest of the day.  You can resume normal activity the day after the procedure however you should NOT DRIVE or use heavy machinery for 24 hours (because of the sedation medicines used during the test).    SYMPTOMS TO REPORT IMMEDIATELY  A gastroenterologist can be reached at any hour.  Please call your doctor's office for any of the following symptoms:   Following lower endoscopy (colonoscopy, flexible sigmoidoscopy)  Excessive amounts of blood in the stool  Significant tenderness, worsening of abdominal pains  Swelling of the abdomen that is new, acute  Fever of 100 or higher  Following upper endoscopy (EGD, EUS, ERCP)  Vomiting of blood or coffee ground material  New, significant abdominal pain  New, significant chest pain or pain under the shoulder blades  Painful  or persistently difficult swallowing  New shortness of breath  Black, tarry-looking stools  FOLLOW UP: If any biopsies were taken you will be contacted by phone or by letter within the next 1-3 weeks.  Call your gastroenterologist if you have not heard about the biopsies in 3 weeks.  Please also call your gastroenterologist's office with any specific questions about appointments or follow up tests.

## 2014-10-13 NOTE — Telephone Encounter (Signed)
Patient need a refill of the insulin that goes in the vgo pump. [facility] on [address]

## 2014-10-13 NOTE — Op Note (Signed)
[facility] [place], [address]   COLONOSCOPY PROCEDURE REPORT  PATIENT: Henry, [name]  MR#: [id number] BIRTHDATE: Jan 27, 1950 , 12  yrs. old GENDER: male ENDOSCOPIST: Henry Bears, MD REFERRED [id number] Linna Darner, M.D. PROCEDURE DATE:  10/13/2014 PROCEDURE:   Colonoscopy, surveillance , Colonoscopy with cold biopsy polypectomy, and Colonoscopy with snare polypectomy First Screening Colonoscopy - Avg.  risk and is 50 yrs.  old or older - No.  Prior Negative Screening - Now for repeat screening. N/A  History of Adenoma - Now for follow-up colonoscopy & has been > or = to 3 yrs.  Yes hx of adenoma.  Has been 3 or more years since last colonoscopy.  Polyps removed today? Yes ASA CLASS:   Class III INDICATIONS:Surveillance due to prior colonic neoplasia and PH Colon Adenoma, last colonoscopy 1999. MEDICATIONS: Monitored anesthesia care and Per Anesthesia  DESCRIPTION OF PROCEDURE:   After the risks benefits and alternatives of the procedure were thoroughly explained, informed consent was obtained.  The digital rectal exam revealed no rectal mass.   The EC-[id number] ([id number])  endoscope was introduced through the anus and advanced to the cecum, which was identified by both the appendix and ileocecal valve. No adverse events experienced. The quality of the prep was good.  (Suprep was used)  The instrument was then slowly withdrawn as the colon was fully examined. Estimated blood loss is zero unless otherwise noted in this procedure report.   COLON FINDINGS: A sessile polyp measuring 4 mm in size was found at the cecum.  A polypectomy was performed with cold forceps.  The resection was complete, the polyp tissue was completely retrieved and sent to histology.   Three sessile polyps were found in the ascending colon.  Polypectomies were performed with a cold snare (1) and with cold forceps (2).  The resection was complete, the polyp tissue was completely  retrieved and sent to histology. Three sessile polyps ranging between 3-15mm in size were found in the transverse colon (2) and sigmoid colon (1).  Polypectomies were performed with cold forceps.  The resection was complete, the polyp tissue was completely retrieved and sent to histology.   A sessile polyp measuring 6 mm in size was found in the rectosigmoid colon. A polypectomy was performed with a cold snare.  The resection was complete, the polyp tissue was completely retrieved and sent to histology.  Persistent oozing at the site was controlled using hemoclip.  One (1) placement was made.  There was minimal blood loss from maneuver subsiding by end of procedure.  Retroflexed views revealed internal hemorrhoids and hypertrophied anal papillae. The time to cecum = 4.5 Withdrawal time = 18.4   The scope was withdrawn and the procedure completed. COMPLICATIONS: There were no immediate complications.  ENDOSCOPIC IMPRESSION: 1.   Sessile polyp was found at the cecum; polypectomy was performed with cold forceps 2.   Three sessile polyps were found in the ascending colon; polypectomies were performed with a cold snare and with cold forceps 3.   Three sessile polyps ranging between 3-85mm in size were found in the transverse colon and sigmoid colon; polypectomies were performed with cold forceps 4.   Sessile polyp was found in the rectosigmoid colon; polypectomy was performed with a cold snare; oozing at the site was controlled using hemoclip x 1  RECOMMENDATIONS: 1.  Await pathology results 2.  Timing of repeat colonoscopy will be determined by pathology findings. 3.  You will receive a letter within 1-2  weeks with the results of your biopsy as well as final recommendations.  Please call my office if you have not received a letter after 3 weeks.  eSigned:  Jerene Bears, MD 10/13/2014 9:08 AM   cc: Henry Limes, MD and The Patient   PATIENT NAME:  Henry, [name] MR#:  [id number]

## 2014-10-14 ENCOUNTER — Encounter (HOSPITAL_COMMUNITY): Payer: Self-pay | Admitting: Internal Medicine

## 2014-10-14 ENCOUNTER — Encounter: Payer: Self-pay | Admitting: Internal Medicine

## 2014-10-18 ENCOUNTER — Other Ambulatory Visit: Payer: Self-pay | Admitting: Internal Medicine

## 2014-10-19 ENCOUNTER — Other Ambulatory Visit: Payer: Self-pay | Admitting: Emergency Medicine

## 2014-10-19 MED ORDER — POTASSIUM CHLORIDE CRYS ER 10 MEQ PO TBCR: 30.0000 meq | EXTENDED_RELEASE_TABLET | Freq: Every day | ORAL | Status: DC

## 2014-10-20 ENCOUNTER — Ambulatory Visit (INDEPENDENT_AMBULATORY_CARE_PROVIDER_SITE_OTHER): Payer: Federal, State, Local not specified - PPO | Admitting: Endocrinology

## 2014-10-20 ENCOUNTER — Other Ambulatory Visit: Payer: Self-pay | Admitting: Internal Medicine

## 2014-10-20 VITALS — BP 122/66 | HR 88 | Temp 98.2°F | Resp 16 | Ht 68.0 in | Wt 268.0 lb

## 2014-10-20 DIAGNOSIS — I255 Ischemic cardiomyopathy: Secondary | ICD-10-CM

## 2014-10-20 DIAGNOSIS — Z794 Long term (current) use of insulin: Secondary | ICD-10-CM | POA: Diagnosis not present

## 2014-10-20 DIAGNOSIS — E890 Postprocedural hypothyroidism: Secondary | ICD-10-CM

## 2014-10-20 DIAGNOSIS — E1165 Type 2 diabetes mellitus with hyperglycemia: Secondary | ICD-10-CM | POA: Diagnosis not present

## 2014-10-20 NOTE — Progress Notes (Signed)
Patient ID: Henry [name], male   DOB: 1950/03/01, 64 y.o.   MRN: [id number]           Reason for Appointment: Follow-up for Type 2 Diabetes  Referring physician: Linna Darner  History of Present Illness:          Diagnosis: Type 2 diabetes mellitus, date of diagnosis: 2000      Past history:   Patient thinks he has been taking Amaryl for several years and probably metformin since onset also At some point he was changed from metformin to [facility] and was taking this since at least 2012 A1c had been higher in 2015 Since his A1c had been progressively higher with his regimen of Janumet and Amaryl he was started on Victoza in 01/2014 He was started on Lantus insulin in 5/16 because of persistent hyperglycemia especially fasting; was having readings as high as 293  Recent history:   INSULIN regimen: V-go-20 pump and boluses 2-4 units  Because of tendency to high postprandial readings and inconsistent control with Lantus he was switched to the V-go pump in late 09/2014 Last A1c was 7.2 He is also on regimen of metformin, Amaryl and Victoza 1.8 mg He has no difficulty putting on the pump  Current blood sugar patterns and problems identified:  He is not clear about how to do his pump boluses.  He is not doing the boluses for all meals and mostly at suppertime; also is not clear whether due to the boluses before or after eating.  FASTING blood sugars are quite variable but not consistently high; median reading is better than before when he was taking 26 Lantus.  He again has postprandial hyperglycemia at times but less recently  Occasionally may have high sugars from eating snacks on cookies including some at night  He thinks he is eating relatively more at dinnertime and has a small late lunch, eating breakfast at different times of the day  He has taken only 1.2 mg Victoza daily because of high cost of 1.8 mg  He has  seen the dietitian in 3/16 Not able to exercise much to be effective  in weight loss       Oral hypoglycemic drugs the patient is taking are: Metformin 1 g twice a day, Amaryl  4 mg a.m. and 2 mg p.m.   Side effects from medications have been: None Compliance with the medical regimen: Fair  Hypoglycemia:   none  Glucose monitoring:  done 1-2 times  a day         Glucometer: One Touch.      Blood Glucose readings by download recently; overall average is for the last 4 weeks  Mean values apply above for all meters except median for One Touch  PRE-MEAL Fasting Lunch Dinner Bedtime Overall  Glucose range:  108-208       Mean/median: 138    156   POST-MEAL PC Breakfast PC Lunch PC Dinner  Glucose range:  133-191   98-210   83-303  Mean/median:    195   Self-care: The diet that the patient has been following is:  tries to eat more vegetables, .        Meals: 8am , 2-3 pm and 6-7 pm    Exercise: unable to do any         Dietician visit, most recent: 03/2014                Weight history: Previous range: 266-277  Wt Readings from  Last 3 Encounters:  10/20/14 268 lb (121.564 kg)  10/13/14 260 lb (117.935 kg)  10/01/14 260 lb 3.2 oz (118.026 kg)    Glycemic control:   Lab Results  Component Value Date   HGBA1C 7.2* 08/20/2014   HGBA1C 7.8* 05/19/2014   HGBA1C 9.7* 01/13/2014   Lab Results  Component Value Date   MICROALBUR 0.9 01/13/2014   LDLCALC 126* 04/08/2013   CREATININE 1.13 08/20/2014         Medication List       This list is accurate as of: 10/20/14  9:47 AM.  Always use your most recent med list.               acetaminophen 325 MG tablet  Commonly known as:  TYLENOL  Take 1-2 tablets (325-650 mg total) by mouth every 4 (four) hours as needed for mild pain.     albuterol 108 (90 BASE) MCG/ACT inhaler  Commonly known as:  PROVENTIL HFA;VENTOLIN HFA  Inhale 2 puffs into the lungs every 6 (six) hours as needed for wheezing or shortness of breath.     aspirin EC 81 MG tablet  Take 81 mg by mouth daily.     atorvastatin  40 MG tablet  Commonly known as:  LIPITOR  TAKE 1 TABLET (40 MG TOTAL) BY MOUTH DAILY.     betamethasone dipropionate 0.05 % cream  Commonly known as:  DIPROLENE  Apply topically 2 (two) times daily.     clonazePAM 0.5 MG tablet  Commonly known as:  KLONOPIN  TAKE 1 & 1/2 TABLETS AT BEDTIME     Fluticasone-Salmeterol 250-50 MCG/DOSE Aepb  Commonly known as:  ADVAIR  Inhale 1 puff into the lungs 2 (two) times daily as needed (for copd symptoms).     furosemide 80 MG tablet  Commonly known as:  LASIX  Take 1.5 tablets by mouth daily.     glimepiride 4 MG tablet  Commonly known as:  AMARYL  1 in am and 1/2 at supper     ibuprofen 200 MG tablet  Commonly known as:  ADVIL,MOTRIN  Take 800 mg by mouth daily as needed for pain.     Insulin Glargine 100 UNIT/ML Solostar Pen  Commonly known as:  LANTUS SOLOSTAR  Inject up to 20 units at night     insulin lispro 100 UNIT/ML injection  Commonly known as:  HUMALOG  Use max 56 units per day with V-go pump     levothyroxine 125 MCG tablet  Commonly known as:  SYNTHROID, LEVOTHROID  TAKE 1 TABLET EVERY DAY DAILY BEFORE BREAKFAST     metFORMIN 1000 MG tablet  Commonly known as:  GLUCOPHAGE  Take 1 tablet (1,000 mg total) by mouth 2 (two) times daily with a meal.     metoprolol succinate 50 MG 24 hr tablet  Commonly known as:  TOPROL-XL  TAKE 1 TABLET (50 MG TOTAL) BY MOUTH AT BEDTIME. TAKE WITH OR IMMEDIATELY FOLLOWING A MEAL.     multivitamin with minerals Tabs tablet  Take 1 tablet by mouth daily. Antioxidant  Multi vitamin     nitroGLYCERIN 0.4 MG SL tablet  Commonly known as:  NITROSTAT  Place 1 tablet (0.4 mg total) under the tongue every 5 (five) minutes as needed for chest pain.     NOVOTWIST 32G X 5 MM Misc  Generic drug:  Insulin Pen Needle  USE ONE PER DAY TO INJECT VICTOZA     potassium chloride 10 MEQ tablet  Commonly known  as:  KLOR-CON M10  Take 3 tablets (30 mEq total) by mouth daily.     pramipexole 0.5  MG tablet  Commonly known as:  MIRAPEX  Take 0.5 mg by mouth 2 (two) times daily.     spironolactone 25 MG tablet  Commonly known as:  ALDACTONE  Take 1 tablet (25 mg total) by mouth daily.     SUPREP BOWEL PREP Soln  Generic drug:  Na Sulfate-K Sulfate-Mg Sulf  Take 1 kit by mouth once.     traMADol 50 MG tablet  Commonly known as:  ULTRAM  TAKE 1 TABLET TWICE A DAY AS NEED     travoprost (benzalkonium) 0.004 % ophthalmic solution  Commonly known as:  TRAVATAN  Place 1 drop into the right eye at bedtime.     trihexyphenidyl 2 MG tablet  Commonly known as:  ARTANE  Take 1 tablet (2 mg total) by mouth 2 (two) times daily.     V-GO 20 Kit  Use one per day     VICTOZA 18 MG/3ML Sopn  Generic drug:  Liraglutide  INJECT 1.8 MG ONCE A DAY     vitamin B-12 1000 MCG tablet  Commonly known as:  CYANOCOBALAMIN  Take 1,000 mcg by mouth daily.        Allergies:  Allergies  Allergen Reactions  . Penicillins Rash    Because of a history of documented adverse serious drug reaction;Medi Alert bracelet  is recommended Has patient had a PCN reaction causing immediate rash, facial/tongue/throat swelling, SOB or lightheadedness with hypotension: Yes Has patient had a PCN reaction causing severe rash involving mucus membranes or skin necrosis: unknown Has patient had a PCN reaction that required hospitalization NO Has patient had a PCN reaction occurring within the last 10 years: NO If all of the above answers are "N    Past Medical History  Diagnosis Date  . Cardiomyopathy   . CAD (coronary artery disease)   . HTN (hypertension)     pt denies 08/19/12  . GERD (gastroesophageal reflux disease)   . Hyperplasia, prostate   . Benign neoplasm of colon   . Nephrolithiasis   . Ventral hernia   . HLD (hyperlipidemia)   . Parkinson disease (HCC)     1999  . Complication of anesthesia     pt states that he got a rash  . Shortness of breath     Hx: of at all times  . UTI (lower  urinary tract infection) 09/15/12    Klebsiella  . Asthma   . DM (diabetes mellitus) (HCC)     TYPE 2   . Hyperthyroidism     thyroid lobectomy  . Arthritis     cane  . AICD (automatic cardioverter/defibrillator) present     Dr Ladona Ridgel office visit yearly  . 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 Sleep apnea   . OSA (obstructive sleep apnea)     AHI-28,on CPAP, noncompliant with CPAP  . Sleep apnea, organic   . MI (myocardial infarction) [facility])     Dr Jens Som 2000, x3vessels bypass    Past Surgical History  Procedure Laterality Date  . Coronary artery bypass graft  2000    Tressie Stalker, MD  . Coronary stent placement  1998  . Median sternotomy  2000  . Deep brain stimulator placement  2004    Right and left VIN stimulator placement (parkinsons)  . 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 Finger amputation      left pointer  . Cataract extraction w/ intraocular lens implant      Hx: of right eye  . Tonsillectomy    . Colonoscopy w/ biopsies and polypectomy      Hx: of  . Cardiac catheterization    . Subthalamic stimulator battery replacement N/A 09/05/2012    Procedure: Deep brain stimulator battery change;  Surgeon: Erline Levine, MD;  Location: [place] [facility];  Service: Neurosurgery;  Laterality: N/A;  Deep brain stimulator battery change  . Left and right heart catheterization with coronary angiogram N/A 09/24/2013    Procedure: LEFT AND RIGHT HEART CATHETERIZATION WITH CORONARY ANGIOGRAM;  Surgeon: Burnell Blanks, MD;  Location: [facility] CATH LAB;  Service: Cardiovascular;  Laterality: N/A;  . Implantable cardioverter defibrillator implant N/A 11/13/2013    Procedure: IMPLANTABLE CARDIOVERTER DEFIBRILLATOR IMPLANT;  Surgeon: Evans Lance, MD;  Location: [facility] CATH LAB;  Service: Cardiovascular;  Laterality: N/A;  . Eye surgery    . Insert / replace / remove pacemaker    . Colonoscopy N/A 10/13/2014    Procedure: COLONOSCOPY;  Surgeon:  Jerene Bears, MD;  Location: [facility] ENDOSCOPY;  Service: Gastroenterology;  Laterality: N/A;    Family History  Problem Relation Age of Onset  . Peripheral vascular disease    . Arthritis    . Alcoholism Mother 78    deceased   . Aneurysm Father 46  . Autoimmune disease Brother 23    AIDS  . Diabetes Neg Hx   . Heart disease Neg Hx   . Colon cancer Neg Hx     Social History:  reports that he has never smoked. He has never used smokeless tobacco. He reports that he drinks alcohol. He reports that he does not use illicit drugs.    Review of Systems     Most recent eye exam was done in 11/15       Lipids: On Lipitor, tends to have high triglycerides and low HDL      Lab Results  Component Value Date   CHOL 147 05/19/2014   HDL 35.50* 05/19/2014   LDLCALC 126* 04/08/2013   LDLDIRECT 86.0 05/19/2014   TRIG 213.0* 05/19/2014   CHOLHDL 4 05/19/2014                  Thyroid:   He has had hypothyroidism following radioactive iodine treatment for hyperthyroidism in 2013  TSH is normal with  dosage of 125 g   Lab Results  Component Value Date   TSH 3.57 08/20/2014   TSH 4.60* 01/13/2014   TSH 10.27* 08/06/2013   FREET4 0.82 08/20/2014   FREET4 0.98 11/20/2011   FREET4 0.91 10/16/2011       The blood pressure has been followed by [facility], currently taking losartanAnd 25 mg Aldactone  Diabetic foot exam in 1/16 showed shows normal monofilament sensation in the toes except decreased on the right great toe and plantar surfaces, no skin lesions or ulcers on the feet and normal pedal pulses    Physical Examination:  Ht $R'5\' 8"'Ob$  (1.727 m)  Wt 268 lb (121.564 kg)  BMI 40.76 kg/m2     ASSESSMENT:  Diabetes type 2, uncontrolled with obesity    See history of present illness for detailed discussion of his current management, blood sugar patterns and problems identified His blood sugar monitor was downloaded and blood sugar patterns analyzed and discussed with patient and his  wife  He  has somewhat better blood sugars overall with the V-go pump especially fasting Currently still has sporadic high readings at all times He is not clear about how to do the boluses for his meals and snacks and this was discussed in detail Currently requiring relatively small amounts of boluses, mostly 2-4 units Likely is still benefiting from using Victoza He is eating somewhat irregularly and more snacks and some sweets that didn't cause high sugars Also unable to exercise or lose weight  Probably does not need Amaryl and may adjust his insulin doses if blood sugars tend to go up with stopping this   PLAN:   He will increase his boluses to 2 units for large meals  Take at least 2 units for smaller meals and large snacks or sweets  More consistent monitoring after meals   Answered all questions about using the pump and he will call if he has any problems  Discussed that if he has difficulty affording the Humalog he may try the [employer] brand regular insulin   There are no Patient Instructions on file for this visit.   Counseling time on subjects discussed above is over 50% of today's 25 minute visit  Robert Sperl 10/20/2014, 9:47 AM   Note: This office note was prepared with Estate agent. Any transcriptional errors that result from this process are unintentional.

## 2014-10-20 NOTE — Patient Instructions (Signed)
Stop Glimeperide  Must do 1 click with each meal and snack and 2 for larger meals.    Check blood sugars on waking up .Marland Kitchen3-4  .Marland Kitchen times a week Also check blood sugars about 2 hours after a meal and do this after different meals by rotation Recommended blood sugar levels on waking up is 90-130 and about 2 hours after meal is 140-180  Please bring blood sugar monitor to each visit.

## 2014-10-27 ENCOUNTER — Ambulatory Visit: Payer: Federal, State, Local not specified - PPO | Admitting: Endocrinology

## 2014-11-03 ENCOUNTER — Encounter: Payer: Self-pay | Admitting: Neurology

## 2014-11-03 ENCOUNTER — Ambulatory Visit (INDEPENDENT_AMBULATORY_CARE_PROVIDER_SITE_OTHER): Payer: Federal, State, Local not specified - PPO | Admitting: Neurology

## 2014-11-03 VITALS — BP 110/78 | HR 80 | Ht 68.0 in | Wt 262.5 lb

## 2014-11-03 DIAGNOSIS — G4752 REM sleep behavior disorder: Secondary | ICD-10-CM

## 2014-11-03 DIAGNOSIS — I255 Ischemic cardiomyopathy: Secondary | ICD-10-CM

## 2014-11-03 DIAGNOSIS — M47817 Spondylosis without myelopathy or radiculopathy, lumbosacral region: Secondary | ICD-10-CM | POA: Diagnosis not present

## 2014-11-03 DIAGNOSIS — G20 Parkinson's disease: Secondary | ICD-10-CM

## 2014-11-03 DIAGNOSIS — K117 Disturbances of salivary secretion: Secondary | ICD-10-CM

## 2014-11-03 DIAGNOSIS — M5137 Other intervertebral disc degeneration, lumbosacral region: Secondary | ICD-10-CM | POA: Diagnosis not present

## 2014-11-03 DIAGNOSIS — M545 Low back pain: Secondary | ICD-10-CM | POA: Diagnosis not present

## 2014-11-03 NOTE — Procedures (Signed)
DBS Programming was performed.    Total time spent programming was 30 minutes.  Device was confirmed to be on.  Soft start was confirmed to be on.  Impedences were checked and were within normal limits.  Battery was checked and was determined to be functioning normally and not near the end of life.  Detailed analysis on separate neurophysiologic worksheet.    Final settings were as follows:  Left brain electrode:     1-2+           ; Amplitude  4.0   V   ; Pulse width 90 microseconds;   Frequency   150   Hz.  Right brain electrode:     4-7+          ; Amplitude   4.3  V ;  Pulse width 90  microseconds;  Frequency   150    Hz.

## 2014-11-03 NOTE — Progress Notes (Signed)
Henry [name] was seen today in the movement disorders clinic for neurologic consultation at the request of Henry [name].  His PCP is Henry Cobble, MD.  The consultation is for the evaluation of PD and to manage his DBS.  He is accompanied by his wife who supplements the history.  The first symptom(s) the patient noticed was right hand tremor in 1999.  He was seen by neurology and was dx with PD.  He was placed on something, but it caused sleepiness.  He does not think that he has ever been on levodopa.   He was placed on Mirapex, which seemed to help.  He only takes it twice per day.  He didn't think it made a difference when he took it tid.  He began to have tremor and it was suggested he do DBS.    The pt is s/p stn DBS in 2005.  He had a battery change in 2009.    10/02/12 update: The patient is accompanied today by his daughter, who supplements the history.  I reviewed medical records available to me since last visit.  The patient had his generator changed on 09/05/2012.  He remains on pramipexole, 0.5 mg twice per day.  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 Brett [name] in that regard.  He is not compliant with CPAP so says that he doesn't want to return to her for f/u. He doesn't take the klonopin faithfully.  10/11/12 update:  Pt was seen as a walk in/work in today.  Pt had increasing tremor, L greater than R for 3 days.  Thinks that it started with the d/c of artane.  Speech stable.  11/19/12 update:  Pt is seen today for his PD, accompanied by his daughter who supplements the hx.    He is currently on klonopin 0.5 mg - 1/2-1 tablet q hs.  He only takes it when his wife is not working.  She works nights and is only in 2 days per week.  He has some reluctance to take it other nights.  He is on pramipexole 0.5 mg bid.  Last visit, his DBS was reset more similar to the settings he had prior to coming here, just with an increased voltage.  About 2 weeks after our last  visit, the patient decided to go back on the Artane.  The combination of the Artane and the DBS changes helped significantly.  He does ask if I can slightly increased voltage on the left hand, as he has some tremor at night that is bothersome.  Otherwise, he is doing well.  He feels that his balance has been great.  02/19/13 update:  This patient is accompanied in the office by his child who supplements the history.  The pt has a hx of PD.  He has been tremor free and is very happy about that.  He has had some increased balance loss; the last fall was a few months ago but he hasn't gotten hurt.  Was considering hernia surgery.  The records that were made available to me were reviewed.  He is holding on that for now but plans to have it done before end of year.  No hallucinations.  He is still having some acting out of the dreams, despite clonazepam.  06/19/13 update:  Patient is returning to followup regarding his Parkinson's disease.  I had the opportunity to review records since last visit.  He went to the emergency room on 05/12/2013  with chest pain and shortness of breath.  He had been fishing and had missed several doses of Lasix.  He had been eating seafood.  He ended up with an acute exacerbation of his chronic congestive heart failure.  Once he was diuresed and back on his medications, he is feeling better.  He just had a nuclear medicine study done on 06/17/2013 and the ejection fraction on this looked better than on his echocardiogram.  The left ventricular ejection fraction was 41%.  Prior to that, there was some concern that his left ventricular ejection fraction had dropped so much that he may need an ICD.  In terms of Parkinson's disease, the patient states that he has been doing very well in terms of tremor, but his walking really has deteriorated.  He describes a festinating gait, with much more shuffling.  He fell walking over his dog gate but otherwise has not had falls.  He went fishing  yesterday and states that he "stumbled all day long."  No hallucinations.  He has been trying to be more faithful with his CPAP. He remains on Mirapex 0.5 mg twice a day, Artane 2 mg twice a day.  10/23/13 update:  The patient returns today to the clinic, accompanied by his wife who supplements the history.  From a Parkinson's standpoint, the patient states that he has been doing very well.  No tremor.  He started on levodopa last visit and states that he has had no falls and overall stumbling has been much better.  He states that the only time that he stumbles is if he is inside of his fishing boat, and states that that is really because it is small and unstable.  He is taking his levodopa in the morning, after lunch and bedtime.  He has had no hallucinations.  His wife states that he is still draining and acting out the dreams and even fell out of bed a few days ago.  However, he is taking his clonazepam about 10 PM but doesn't go to bed until 1 AM.  He is not using his CPAP.  Is having significant constipation.   I reviewed his cardiology records since last visit.  He did have a heart catheterization and is scheduled to have an ICD placed on November 5.  02/23/14 update:  Pt returns for follow up accompanied by his wife who supplements the history.  The records that were made available to me were reviewed since last visit.  He had an ICD placed on 11/15/13.  He is doing well.  Taking carbidopa/levodopa 25/100 three times a day and remains on artane as well as mirapex 0.5 mg bid.  Had a fall up the stairs.  Wife describes festinating gate and pt thinks that levodopa contributes.  He also doesn't feel good much of the time and thinks that is from levodopa.  Admits, however, that when BS under good control, he feels good.  Has started seeing endocrinology and started on new meds.  He has klonopin for RBD.  Still vivid dreams but no falling out bed (in recliner now).  Having some word finding trouble and memory  doesn't seem clear.  Also, put back tailgate down on car and came down on generator and wants me to look at that.    07/01/14 update:  The patient follows up today regarding his Parkinson's disease.  He is accompanied by his wife who supplements the history.  Last visit, the patient was complaining about memory change which I  thought was likely multifactorial and due to medication such as Artane, Mirapex and Ultram, which he was taking every 3 hours.  The patient, however, thought that it was from levodopa and so we held it and the patient reported that he felt better and therefore continued to stay off of the medication.  He reports that falls are better after d/c levodopa but wife reports still falls.  Pt states that he only fell in shower after he d/c levodopa.  His wife does admit that she thinks that levodopa was causing loss of balance.  He is having more tremor over the last month on the L hand.   He remains on clonazepam 0.5 mg, 1-1/2 tablets at night for REM behavior disorder.  I reviewed records since last visit.  He has seen Dr. Dwyane Dee multiple times in regards to his uncontrolled diabetes.  He started on insulin on May 22 2014.  He states that he is doing well with injecting himself.  Having a lot of constipation.  Has the rancho recipe.    11/03/14 update:  The patient is following up today, accompanied by his wife who supplements the history.  Records were reviewed since our last visit.  The patient is on pramipexole 0.5 mg twice a day and Artane 2 mg twice a day.  He has not had any hallucinations.  Had a fall the other day walking up stairs carrying something and fell backward.  Hit his back.  No LOC.  Also fell backward in the summer around the pool.  [place] one time out of his boat.  Doesn't want to use a walker. He was in the emergency room recently with a rash that was felt secondary to insect bites that he got in the woods.  He also recently had a colonoscopy.  His diabetes is under better  control and his last A1c was 7.2.  States that he now has an insulin pump.  He remains on clonazepam 0.5 mg, 1-1/2 tablets at night for REM behavior disorder.  More back pain lately and will start injections for the back which have helped previously  PREVIOUS MEDICATIONS: Mirapex and artane  ALLERGIES:   Allergies  Allergen Reactions  . Penicillins Rash    Because of a history of documented adverse serious drug reaction;Medi Alert bracelet  is recommended Has patient had a PCN reaction causing immediate rash, facial/tongue/throat swelling, SOB or lightheadedness with hypotension: Yes Has patient had a PCN reaction causing severe rash involving mucus membranes or skin necrosis: unknown Has patient had a PCN reaction that required hospitalization NO Has patient had a PCN reaction occurring within the last 10 years: NO If all of the above answers are "N    CURRENT MEDICATIONS:     Medication List       This list is accurate as of: 11/03/14  4:02 PM.  Always use your most recent med list.               acetaminophen 325 MG tablet  Commonly known as:  TYLENOL  Take 1-2 tablets (325-650 mg total) by mouth every 4 (four) hours as needed for mild pain.     albuterol 108 (90 BASE) MCG/ACT inhaler  Commonly known as:  PROVENTIL HFA;VENTOLIN HFA  Inhale 2 puffs into the lungs every 6 (six) hours as needed for wheezing or shortness of breath.     aspirin EC 81 MG tablet  Take 81 mg by mouth daily.     atorvastatin 40 MG tablet  Commonly known as:  LIPITOR  TAKE 1 TABLET (40 MG TOTAL) BY MOUTH DAILY.     betamethasone dipropionate 0.05 % cream  Commonly known as:  DIPROLENE  Apply topically 2 (two) times daily.     clonazePAM 0.5 MG tablet  Commonly known as:  KLONOPIN  TAKE 1 & 1/2 TABLETS AT BEDTIME     Fluticasone-Salmeterol 250-50 MCG/DOSE Aepb  Commonly known as:  ADVAIR  Inhale 1 puff into the lungs 2 (two) times daily as needed (for copd symptoms).     furosemide 80  MG tablet  Commonly known as:  LASIX  Take 1.5 tablets by mouth daily.     ibuprofen 200 MG tablet  Commonly known as:  ADVIL,MOTRIN  Take 800 mg by mouth daily as needed for pain.     insulin lispro 100 UNIT/ML injection  Commonly known as:  HUMALOG  Use max 56 units per day with V-go pump     levothyroxine 125 MCG tablet  Commonly known as:  SYNTHROID, LEVOTHROID  TAKE 1 TABLET EVERY DAY DAILY BEFORE BREAKFAST     metFORMIN 1000 MG tablet  Commonly known as:  GLUCOPHAGE  Take 1 tablet (1,000 mg total) by mouth 2 (two) times daily with a meal.     metoprolol succinate 50 MG 24 hr tablet  Commonly known as:  TOPROL-XL  TAKE 1 TABLET (50 MG TOTAL) BY MOUTH AT BEDTIME. TAKE WITH OR IMMEDIATELY FOLLOWING A MEAL.     multivitamin with minerals Tabs tablet  Take 1 tablet by mouth daily. Antioxidant  Multi vitamin     nitroGLYCERIN 0.4 MG SL tablet  Commonly known as:  NITROSTAT  Place 1 tablet (0.4 mg total) under the tongue every 5 (five) minutes as needed for chest pain.     NOVOTWIST 32G X 5 MM Misc  Generic drug:  Insulin Pen Needle  USE ONE PER DAY TO INJECT VICTOZA     potassium chloride 10 MEQ tablet  Commonly known as:  KLOR-CON M10  Take 3 tablets (30 mEq total) by mouth daily.     pramipexole 0.5 MG tablet  Commonly known as:  MIRAPEX  Take 0.5 mg by mouth 2 (two) times daily.     spironolactone 25 MG tablet  Commonly known as:  ALDACTONE  Take 1 tablet (25 mg total) by mouth daily.     SUPREP BOWEL PREP Soln  Generic drug:  Na Sulfate-K Sulfate-Mg Sulf  Take 1 kit by mouth once.     traMADol 50 MG tablet  Commonly known as:  ULTRAM  TAKE 1 TABLET TWICE A DAY AS NEED     travoprost (benzalkonium) 0.004 % ophthalmic solution  Commonly known as:  TRAVATAN  Place 1 drop into the right eye at bedtime.     trihexyphenidyl 2 MG tablet  Commonly known as:  ARTANE  Take 1 tablet (2 mg total) by mouth 2 (two) times daily.     V-GO 20 Kit  Use one per day      VICTOZA 18 MG/3ML Sopn  Generic drug:  Liraglutide  INJECT 1.8 MG ONCE A DAY     vitamin B-12 1000 MCG tablet  Commonly known as:  CYANOCOBALAMIN  Take 1,000 mcg by mouth daily.         PAST MEDICAL HISTORY:   Past Medical History  Diagnosis Date  . Cardiomyopathy   . CAD (coronary artery disease)   . HTN (hypertension)     pt denies 08/19/12  .  GERD (gastroesophageal reflux disease)   . Hyperplasia, prostate   . Benign neoplasm of colon   . Nephrolithiasis   . Ventral hernia   . HLD (hyperlipidemia)   . Parkinson disease ([place])     1999  . Complication of anesthesia     pt states that he got a rash  . Shortness of breath     Hx: of at all times  . UTI (lower urinary tract infection) 09/15/12    Klebsiella  . Asthma   . DM (diabetes mellitus) ([place])     TYPE 2   . Hyperthyroidism     thyroid lobectomy  . Arthritis     cane  . AICD (automatic cardioverter/defibrillator) present     Dr Lovena Le office visit yearly  . 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 Sleep apnea   . OSA (obstructive sleep apnea)     AHI-28,on CPAP, noncompliant with CPAP  . Sleep apnea, organic   . MI (myocardial infarction) [facility])     Dr Stanford Breed 2000, x3vessels bypass    PAST SURGICAL HISTORY:   Past Surgical History  Procedure Laterality Date  . Coronary artery bypass graft  2000    [name], MD  . Coronary stent placement  1998  . Median sternotomy  2000  . Deep brain stimulator placement  2004    Right and left VIN stimulator placement (parkinsons)  . 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 Finger amputation      left pointer  . Cataract extraction w/ intraocular lens implant      Hx: of right eye  . Tonsillectomy    . Colonoscopy w/ biopsies and polypectomy      Hx: of  . Cardiac catheterization    . Subthalamic stimulator battery replacement N/A 09/05/2012    Procedure: Deep brain stimulator battery change;  Surgeon: Erline Levine,  MD;  Location: [facility];  Service: Neurosurgery;  Laterality: N/A;  Deep brain stimulator battery change  . Left and right heart catheterization with coronary angiogram N/A 09/24/2013    Procedure: LEFT AND RIGHT HEART CATHETERIZATION WITH CORONARY ANGIOGRAM;  Surgeon: Burnell Blanks, MD;  Location: [facility] CATH LAB;  Service: Cardiovascular;  Laterality: N/A;  . Implantable cardioverter defibrillator implant N/A 11/13/2013    Procedure: IMPLANTABLE CARDIOVERTER DEFIBRILLATOR IMPLANT;  Surgeon: Evans Lance, MD;  Location: [facility] CATH LAB;  Service: Cardiovascular;  Laterality: N/A;  . Eye surgery    . Insert / replace / remove pacemaker    . Colonoscopy N/A 10/13/2014    Procedure: COLONOSCOPY;  Surgeon: Jerene Bears, MD;  Location: [facility] ENDOSCOPY;  Service: Gastroenterology;  Laterality: N/A;    SOCIAL HISTORY:   Social History   Social History  . Marital Status: Married    Spouse Name: [name]  . Number of Children: 2  . Years of Education: N/A   Occupational History  . 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 Smoking status: Never Smoker   . Smokeless tobacco: Never Used  . Alcohol Use: Yes     Comment: occasional wine  . Drug Use: No  . Sexual Activity: Not on file   Other Topics Concern  . Not on file   Social History Narrative    FAMILY HISTORY:   Family Status  Relation Status Death Age  . Mother Deceased     complications  of surgery  . Father Deceased     EtOHism  . Sister Alive     2 full, one half; PVD  . Brother Deceased     AIDS  . Child Alive     3, alive and well    ROS:  A complete 10 system review of systems was obtained and was unremarkable apart from what is mentioned above.  PHYSICAL EXAMINATION:    VITALS:   Filed Vitals:   11/03/14 1507  BP: 110/78  Pulse: 80  Height: _0  (1.727 m)  Weight: 262 lb 8 oz (119.069 kg)  SpO2: 96%    GEN:  The patient appears stated age and is in NAD. HEENT:   Normocephalic, atraumatic.  The mucous membranes are moist. The superficial temporal arteries are without ropiness or tenderness. CV:  RRR Lungs:  CTAB Neck/HEME:  There are no carotid bruits bilaterally.   Neurological examination:  Orientation: The patient is alert and oriented x3.  Cranial nerves: There is good facial symmetry.  The visual fields are full to confrontational testing. The speech is fluent and just mildly dysarthric.  He is hypophonic. Soft palate rises symmetrically and there is no tongue deviation. Hearing is intact to conversational tone. Motor: Strength is 5/5 in the bilateral upper and lower extremities.   Shoulder shrug is equal and symmetric.  There is no pronator drift.  Movement examination: Tone: There is normal tone in the UE and LE bilaterally Abnormal movements: There is rare L thumb tremor and L leg tremor Coordination:  There is no decremation with RAM's today Gait and Station: The patient has minimal difficulty arising out of a deep-seated chair without the use of the hands. The patient's stride length is decreased  LABS  Lab Results  Component Value Date   WBC 6.9 11/10/2013   HGB 12.8* 11/10/2013   HCT 39.5 11/10/2013   MCV 87.4 11/10/2013   PLT 152.0 11/10/2013   Lab Results  Component Value Date   TSH 3.57 08/20/2014     Chemistry      Component Value Date/Time   NA 141 08/20/2014 1527   K 3.6 08/20/2014 1527   CL 99 08/20/2014 1527   CO2 31 08/20/2014 1527   BUN 17 08/20/2014 1527   CREATININE 1.13 08/20/2014 1527   CREATININE 1.04 06/05/2013 1020      Component Value Date/Time   CALCIUM 9.3 08/20/2014 1527   ALKPHOS 69 08/20/2014 1527   AST 13 08/20/2014 1527   ALT 21 08/20/2014 1527   BILITOT 0.4 08/20/2014 1527     Lab Results  Component Value Date   VITAMINB12 268 08/19/2012   Lab Results  Component Value Date   HGBA1C 7.2* 08/20/2014   DBS programming was performed today, which is described in more detail on a  separate programming procedural notes.   ASSESSMENT/PLAN:  1.  Idiopathic Parkinson's disease.    -I again talked to the patient about the Artane, which he is taking 2 mg twice a day.  I expressed to him that this can cause loss of balance and confusion.  However, when we tried to stop it in the past, he was not able to tolerate tremor.    -The patient will continue on pramipexole, 0.5 mg twice a day.   -Talked to him extensively about the importance of using an ambulatory assistive device, particularly a walker.  He absolutely is not going to use this, however.  Therefore, I talked to him about ski poles or  walking sticks and he was somewhat open to this idea.    -His DBS battery was last changed on 09/05/2012   2.  RBD  -He will continue his clonazepam 0.5 mg, to 1-1/2 tablets per night .   3.  OSAS, noncompliant with CPAP.  -Unfortunately, he has been unable to tolerate CPAP 4.  Sialorrhea  -This is associated with Parkinson's disease.  He may benefit from Myobloc in the future. 5.  Dysphagia.  -Overall, this is fairly mild but I will give an eye on this and consider a MBE if needed. 6.  CHF  -ICD placed on 11/15/2013 7.  Constipation  -copy of rancho recipe given although he still has the problem. 8.  Uncontrolled DM  -Doing much better with insulin pump 9.  Follow-up in the next few months, sooner should new neurologic issues arise.

## 2014-11-05 ENCOUNTER — Other Ambulatory Visit: Payer: Self-pay | Admitting: Family Medicine

## 2014-11-05 ENCOUNTER — Other Ambulatory Visit: Payer: Self-pay | Admitting: *Deleted

## 2014-11-05 MED ORDER — GLUCOSE BLOOD VI STRP: ORAL_STRIP | Status: DC

## 2014-11-08 ENCOUNTER — Other Ambulatory Visit: Payer: Self-pay | Admitting: Cardiology

## 2014-11-12 ENCOUNTER — Other Ambulatory Visit: Payer: Self-pay

## 2014-11-12 DIAGNOSIS — M545 Low back pain: Secondary | ICD-10-CM | POA: Diagnosis not present

## 2014-11-12 DIAGNOSIS — M47817 Spondylosis without myelopathy or radiculopathy, lumbosacral region: Secondary | ICD-10-CM | POA: Diagnosis not present

## 2014-11-12 MED ORDER — GLUCOSE BLOOD VI STRP: ORAL_STRIP | Status: DC

## 2014-11-24 DIAGNOSIS — H40032 Anatomical narrow angle, left eye: Secondary | ICD-10-CM | POA: Diagnosis not present

## 2014-11-24 DIAGNOSIS — H26491 Other secondary cataract, right eye: Secondary | ICD-10-CM | POA: Diagnosis not present

## 2014-11-24 DIAGNOSIS — H2512 Age-related nuclear cataract, left eye: Secondary | ICD-10-CM | POA: Diagnosis not present

## 2014-11-24 DIAGNOSIS — H4030X1 Glaucoma secondary to eye trauma, unspecified eye, mild stage: Secondary | ICD-10-CM | POA: Diagnosis not present

## 2014-11-24 LAB — HM DIABETES EYE EXAM

## 2014-11-30 ENCOUNTER — Other Ambulatory Visit: Payer: Self-pay | Admitting: Cardiology

## 2014-12-01 DIAGNOSIS — M47817 Spondylosis without myelopathy or radiculopathy, lumbosacral region: Secondary | ICD-10-CM | POA: Diagnosis not present

## 2014-12-01 DIAGNOSIS — M545 Low back pain: Secondary | ICD-10-CM | POA: Diagnosis not present

## 2014-12-01 DIAGNOSIS — M5137 Other intervertebral disc degeneration, lumbosacral region: Secondary | ICD-10-CM | POA: Diagnosis not present

## 2014-12-02 ENCOUNTER — Ambulatory Visit (INDEPENDENT_AMBULATORY_CARE_PROVIDER_SITE_OTHER): Payer: Federal, State, Local not specified - PPO | Admitting: *Deleted

## 2014-12-02 DIAGNOSIS — I255 Ischemic cardiomyopathy: Secondary | ICD-10-CM

## 2014-12-02 DIAGNOSIS — I5022 Chronic systolic (congestive) heart failure: Secondary | ICD-10-CM | POA: Diagnosis not present

## 2014-12-02 NOTE — Progress Notes (Signed)
Remote ICD transmission.   

## 2014-12-08 ENCOUNTER — Telehealth: Payer: Self-pay | Admitting: Endocrinology

## 2014-12-08 NOTE — Telephone Encounter (Signed)
Patients wife called stating that her husband will need refills on medication   Rx: Humalog and Victoza (90 day supply)   Pharmacy: [facility]   Thank you

## 2014-12-09 ENCOUNTER — Other Ambulatory Visit: Payer: Self-pay | Admitting: *Deleted

## 2014-12-09 DIAGNOSIS — M545 Low back pain: Secondary | ICD-10-CM | POA: Diagnosis not present

## 2014-12-09 MED ORDER — INSULIN LISPRO 100 UNIT/ML ~~LOC~~ SOLN: SUBCUTANEOUS | Status: DC

## 2014-12-09 MED ORDER — LIRAGLUTIDE 18 MG/3ML ~~LOC~~ SOPN: PEN_INJECTOR | SUBCUTANEOUS | Status: DC

## 2014-12-09 NOTE — Telephone Encounter (Signed)
rx sent

## 2014-12-10 DIAGNOSIS — M545 Low back pain: Secondary | ICD-10-CM | POA: Diagnosis not present

## 2014-12-14 LAB — CUP PACEART REMOTE DEVICE CHECK
Battery Voltage: 3.01 V
Brady Statistic AS VP Percent: 0.03 %
Brady Statistic AS VS Percent: 98.98 %
Brady Statistic RA Percent Paced: 0.99 %
Brady Statistic RV Percent Paced: 0.04 %
Date Time Interrogation Session: 20161123093724
HIGH POWER IMPEDANCE MEASURED VALUE: 76 Ohm
Implantable Lead Location: 753860
Lead Channel Impedance Value: 399 Ohm
Lead Channel Impedance Value: 494 Ohm
Lead Channel Sensing Intrinsic Amplitude: 20.375 mV
Lead Channel Setting Pacing Amplitude: 2.75 V
Lead Channel Setting Pacing Pulse Width: 0.4 ms
Lead Channel Setting Sensing Sensitivity: 0.3 mV
MDC IDC LEAD IMPLANT DT: 20151105
MDC IDC LEAD IMPLANT DT: 20151105
MDC IDC LEAD LOCATION: 753859
MDC IDC MSMT BATTERY REMAINING LONGEVITY: 127 mo
MDC IDC MSMT LEADCHNL RA IMPEDANCE VALUE: 437 Ohm
MDC IDC MSMT LEADCHNL RA PACING THRESHOLD AMPLITUDE: 1.375 V
MDC IDC MSMT LEADCHNL RA PACING THRESHOLD PULSEWIDTH: 0.4 ms
MDC IDC MSMT LEADCHNL RA SENSING INTR AMPL: 2.375 mV
MDC IDC MSMT LEADCHNL RA SENSING INTR AMPL: 2.375 mV
MDC IDC MSMT LEADCHNL RV PACING THRESHOLD AMPLITUDE: 0.375 V
MDC IDC MSMT LEADCHNL RV PACING THRESHOLD PULSEWIDTH: 0.4 ms
MDC IDC MSMT LEADCHNL RV SENSING INTR AMPL: 20.375 mV
MDC IDC SET LEADCHNL RV PACING AMPLITUDE: 2.5 V
MDC IDC STAT BRADY AP VP PERCENT: 0.01 %
MDC IDC STAT BRADY AP VS PERCENT: 0.98 %

## 2014-12-15 ENCOUNTER — Encounter: Payer: Self-pay | Admitting: Cardiology

## 2014-12-15 ENCOUNTER — Other Ambulatory Visit (INDEPENDENT_AMBULATORY_CARE_PROVIDER_SITE_OTHER): Payer: Federal, State, Local not specified - PPO

## 2014-12-15 DIAGNOSIS — E890 Postprocedural hypothyroidism: Secondary | ICD-10-CM

## 2014-12-15 DIAGNOSIS — IMO0002 Reserved for concepts with insufficient information to code with codable children: Secondary | ICD-10-CM

## 2014-12-15 DIAGNOSIS — Z794 Long term (current) use of insulin: Secondary | ICD-10-CM | POA: Diagnosis not present

## 2014-12-15 DIAGNOSIS — M545 Low back pain: Secondary | ICD-10-CM | POA: Diagnosis not present

## 2014-12-15 DIAGNOSIS — E1165 Type 2 diabetes mellitus with hyperglycemia: Secondary | ICD-10-CM | POA: Diagnosis not present

## 2014-12-15 LAB — COMPREHENSIVE METABOLIC PANEL
ALT: 24 U/L (ref 0–53)
AST: 14 U/L (ref 0–37)
Albumin: 4.3 g/dL (ref 3.5–5.2)
Alkaline Phosphatase: 65 U/L (ref 39–117)
BUN: 16 mg/dL (ref 6–23)
CO2: 31 meq/L (ref 19–32)
Calcium: 9.4 mg/dL (ref 8.4–10.5)
Chloride: 99 mEq/L (ref 96–112)
Creatinine, Ser: 1.19 mg/dL (ref 0.40–1.50)
GFR: 65.55 mL/min (ref >60.00)
GLUCOSE: 117 mg/dL — AB (ref 70–99)
POTASSIUM: 4.1 meq/L (ref 3.5–5.1)
SODIUM: 139 meq/L (ref 135–145)
Total Bilirubin: 0.5 mg/dL (ref 0.2–1.2)
Total Protein: 7 g/dL (ref 6.0–8.3)

## 2014-12-15 LAB — TSH: TSH: 2.06 u[IU]/mL (ref 0.35–4.50)

## 2014-12-15 LAB — HEMOGLOBIN A1C: HEMOGLOBIN A1C: 7 % — AB (ref 4.6–6.5)

## 2014-12-15 LAB — MICROALBUMIN / CREATININE URINE RATIO
Creatinine,U: 48.1 mg/dL
MICROALB/CREAT RATIO: 1.5 mg/g (ref 0.0–30.0)
Microalb, Ur: <0.7 mg/dL (ref 0.0–1.9)

## 2014-12-16 ENCOUNTER — Other Ambulatory Visit: Payer: Federal, State, Local not specified - PPO

## 2014-12-16 LAB — FRUCTOSAMINE: FRUCTOSAMINE: 259 umol/L (ref 0–285)

## 2014-12-17 ENCOUNTER — Encounter: Payer: Self-pay | Admitting: Neurology

## 2014-12-17 DIAGNOSIS — M545 Low back pain: Secondary | ICD-10-CM | POA: Diagnosis not present

## 2014-12-19 ENCOUNTER — Other Ambulatory Visit: Payer: Self-pay | Admitting: Neurology

## 2014-12-19 ENCOUNTER — Other Ambulatory Visit: Payer: Self-pay | Admitting: Internal Medicine

## 2014-12-21 ENCOUNTER — Ambulatory Visit: Payer: Federal, State, Local not specified - PPO | Admitting: Endocrinology

## 2014-12-21 ENCOUNTER — Other Ambulatory Visit: Payer: Self-pay | Admitting: *Deleted

## 2014-12-21 ENCOUNTER — Telehealth: Payer: Self-pay | Admitting: Endocrinology

## 2014-12-21 MED ORDER — CLONAZEPAM 0.5 MG PO TABS: ORAL_TABLET | ORAL | Status: DC

## 2014-12-21 NOTE — Telephone Encounter (Signed)
OK but last Rx New PCP

## 2014-12-21 NOTE — Telephone Encounter (Signed)
Clonazepam refill requested. Per last office note- patient to remain on medication. Refill approved and called to patient's pharmacy.   

## 2014-12-21 NOTE — Telephone Encounter (Signed)
Please advise, Last OV 08/11/2014, last refill 08/11/2014 with 2 refills

## 2014-12-21 NOTE — Telephone Encounter (Signed)
Patient no showed today's appt. Please advise on how to follow up. °A. No follow up necessary. °B. Follow up urgent. Contact patient immediately. °C. Follow up necessary. Contact patient and schedule visit in ___ days. °D. Follow up advised. Contact patient and schedule visit in ____weeks. ° °

## 2014-12-21 NOTE — Telephone Encounter (Signed)
RX faxed to pharm  

## 2014-12-22 DIAGNOSIS — M545 Low back pain: Secondary | ICD-10-CM | POA: Diagnosis not present

## 2014-12-22 NOTE — Telephone Encounter (Signed)
Please reschedule at the earliest possible date, if needing lab appointment also may need to do both

## 2014-12-24 DIAGNOSIS — M545 Low back pain: Secondary | ICD-10-CM | POA: Diagnosis not present

## 2014-12-25 ENCOUNTER — Encounter: Payer: Self-pay | Admitting: Endocrinology

## 2014-12-25 ENCOUNTER — Ambulatory Visit (INDEPENDENT_AMBULATORY_CARE_PROVIDER_SITE_OTHER): Payer: Federal, State, Local not specified - PPO | Admitting: Endocrinology

## 2014-12-25 VITALS — BP 122/74 | HR 87 | Temp 97.9°F | Resp 14 | Ht 68.0 in | Wt 262.2 lb

## 2014-12-25 DIAGNOSIS — E669 Obesity, unspecified: Secondary | ICD-10-CM | POA: Diagnosis not present

## 2014-12-25 DIAGNOSIS — E1165 Type 2 diabetes mellitus with hyperglycemia: Secondary | ICD-10-CM | POA: Diagnosis not present

## 2014-12-25 DIAGNOSIS — E785 Hyperlipidemia, unspecified: Secondary | ICD-10-CM | POA: Diagnosis not present

## 2014-12-25 DIAGNOSIS — Z794 Long term (current) use of insulin: Secondary | ICD-10-CM

## 2014-12-25 DIAGNOSIS — E890 Postprocedural hypothyroidism: Secondary | ICD-10-CM | POA: Diagnosis not present

## 2014-12-25 DIAGNOSIS — I255 Ischemic cardiomyopathy: Secondary | ICD-10-CM

## 2014-12-25 NOTE — Patient Instructions (Addendum)
Avoid sweets and night snacks  Glimeperide 4mg  at supper  For snacks may need 1-3 clicks   Check blood sugars on waking up 3-4  times a week Also check blood sugars about 2 hours after a meal and do this after different meals by rotation  Recommended blood sugar levels on waking up is 90-130 and about 2 hours after meal is 130-160  Please bring your blood sugar monitor to each visit, thank you

## 2014-12-25 NOTE — Progress Notes (Signed)
Patient ID: Henry [name], male   DOB: 11-22-1950, 64 y.o.   MRN: [id number]           Reason for Appointment: Follow-up for Type 2 Diabetes  Referring physician: Linna Darner  History of Present Illness:          Diagnosis: Type 2 diabetes mellitus, date of diagnosis: 2000      Past history:   Patient thinks he has been taking Amaryl for several years and probably metformin since onset also At some point he was changed from metformin to [facility] and was taking this since at least 2012 A1c had been higher in 2015 Since his A1c had been progressively higher with his regimen of Janumet and Amaryl he was started on Victoza in 01/2014 He was started on Lantus insulin in 5/16 because of persistent hyperglycemia especially fasting; was having readings as high as 293  Recent history:   INSULIN regimen: V-go-20 pump and boluses 2-4 units  Because of tendency to high postprandial readings and inconsistent control with Lantus he was switched to the V-go pump in late 09/2014  A1c has improved and now down to 7%, previously was 7.2 He is also on regimen of metformin and Victoza 1.8 mg On his last visit because of his insulin-dependent he was advised to stop Amaryl Continues to use the pump for boluses at meals but usually taking small amounts  Current blood sugar patterns and problems identified:  He is having significantly higher FASTING blood sugars recently  His wife says that this is because of his getting up at night and eating snacks on sweets and only occasionally his blood sugars are below 150  He does not bolus for his snacks  Occasionally sugars after supper may be high normal from inadequate boluses although not checking readings in the evenings much; mealtimes are variable and difficult to know which readings are postprandial  Still has not lost any weight and is not very active  He  is usually eating relatively more at dinnertime and has a small late lunch, eating breakfast at  different times of the day  He has taken only 1.2 mg Victoza daily because of high cost of 1.8 mg  He has  seen the dietitian in 3/16 Not able to exercise much to be effective in weight loss       Oral hypoglycemic drugs the patient is taking are: Metformin 1 g twice a day  Side effects from medications have been: None Compliance with the medical regimen: Fair  Hypoglycemia:   none  Glucose monitoring:  done 1-2 times  a day         Glucometer: One Touch.      Blood Glucose readings by download recently; overall average is for the last 4 weeks  Mean values apply above for all meters except median for One Touch  PRE-MEAL Fasting Lunch Dinner Bedtime Overall  Glucose range: 141-222  78-206   144-193   129-195    Mean/median:  172    150    174     Self-care: The diet that the patient has been following is:  tries to eat more vegetables, .        Meals: 9am , 2-3 pm and 7 pm    Exercise: unable to do any         Dietician visit, most recent: 03/2014                Weight history: Previous range: 266-277  Wt Readings from Last 3 Encounters:  12/25/14 262 lb 3.2 oz (118.933 kg)  11/03/14 262 lb 8 oz (119.069 kg)  10/20/14 268 lb (121.564 kg)    Glycemic control:   Lab Results  Component Value Date   HGBA1C 7.0* 12/15/2014   HGBA1C 7.2* 08/20/2014   HGBA1C 7.8* 05/19/2014   Lab Results  Component Value Date   MICROALBUR <0.7 12/15/2014   LDLCALC 126* 04/08/2013   CREATININE 1.19 12/15/2014         Medication List       This list is accurate as of: 12/25/14 11:59 PM.  Always use your most recent med list.               acetaminophen 325 MG tablet  Commonly known as:  TYLENOL  Take 1-2 tablets (325-650 mg total) by mouth every 4 (four) hours as needed for mild pain.     albuterol 108 (90 BASE) MCG/ACT inhaler  Commonly known as:  PROVENTIL HFA;VENTOLIN HFA  Inhale 2 puffs into the lungs every 6 (six) hours as needed for wheezing or shortness of breath.       aspirin EC 81 MG tablet  Take 81 mg by mouth daily.     atorvastatin 40 MG tablet  Commonly known as:  LIPITOR  TAKE 1 TABLET (40 MG TOTAL) BY MOUTH DAILY.     betamethasone dipropionate 0.05 % cream  Commonly known as:  DIPROLENE  Apply topically 2 (two) times daily.     clonazePAM 0.5 MG tablet  Commonly known as:  KLONOPIN  TAKE 1 & 1/2 TABLETS AT BEDTIME     Fluticasone-Salmeterol 250-50 MCG/DOSE Aepb  Commonly known as:  ADVAIR  Inhale 1 puff into the lungs 2 (two) times daily as needed (for copd symptoms).     furosemide 80 MG tablet  Commonly known as:  LASIX  Take 1.5 tablets by mouth daily.     glucose blood test strip  Commonly known as:  ONE TOUCH ULTRA TEST  Use as instructed to check blood sugar 2 times a day dx code E11.59     ibuprofen 200 MG tablet  Commonly known as:  ADVIL,MOTRIN  Take 800 mg by mouth daily as needed for pain.     insulin lispro 100 UNIT/ML injection  Commonly known as:  HUMALOG  Use max 56 units per day with V-go pump     levothyroxine 125 MCG tablet  Commonly known as:  SYNTHROID, LEVOTHROID  TAKE 1 TABLET EVERY DAY DAILY BEFORE BREAKFAST     Liraglutide 18 MG/3ML Sopn  Commonly known as:  VICTOZA  INJECT 1.8 MG ONCE A DAY     metFORMIN 1000 MG tablet  Commonly known as:  GLUCOPHAGE  Take 1 tablet (1,000 mg total) by mouth 2 (two) times daily with a meal.     metoprolol succinate 50 MG 24 hr tablet  Commonly known as:  TOPROL-XL  TAKE 1 TABLET BY MOUTH AT BEDTIME. OR TAKE IMMEDIATELY FOLLOWING A MEAL.     multivitamin with minerals Tabs tablet  Take 1 tablet by mouth daily. Antioxidant  Multi vitamin     nitroGLYCERIN 0.4 MG SL tablet  Commonly known as:  NITROSTAT  Place 1 tablet (0.4 mg total) under the tongue every 5 (five) minutes as needed for chest pain.     NOVOTWIST 32G X 5 MM Misc  Generic drug:  Insulin Pen Needle  USE ONE PER DAY TO INJECT VICTOZA     potassium  chloride 10 MEQ tablet  Commonly known as:   KLOR-CON M10  Take 3 tablets (30 mEq total) by mouth daily.     pramipexole 0.5 MG tablet  Commonly known as:  MIRAPEX  Take 0.5 mg by mouth 2 (two) times daily.     spironolactone 25 MG tablet  Commonly known as:  ALDACTONE  TAKE 1 TABLET (25 MG TOTAL) BY MOUTH DAILY.     SUPREP BOWEL PREP Soln  Generic drug:  Na Sulfate-K Sulfate-Mg Sulf  Take 1 kit by mouth once.     traMADol 50 MG tablet  Commonly known as:  ULTRAM  Take 1 tablet (50 mg total) by mouth 2 (two) times daily as needed. Pt should establish with new PCP for further refills.     travoprost (benzalkonium) 0.004 % ophthalmic solution  Commonly known as:  TRAVATAN  Place 1 drop into the right eye at bedtime.     trihexyphenidyl 2 MG tablet  Commonly known as:  ARTANE  Take 1 tablet (2 mg total) by mouth 2 (two) times daily.     V-GO 20 Kit  Use one per day     vitamin B-12 1000 MCG tablet  Commonly known as:  CYANOCOBALAMIN  Take 1,000 mcg by mouth daily.        Allergies:  Allergies  Allergen Reactions  . Penicillins Rash    Because of a history of documented adverse serious drug reaction;Medi Alert bracelet  is recommended Has patient had a PCN reaction causing immediate rash, facial/tongue/throat swelling, SOB or lightheadedness with hypotension: Yes Has patient had a PCN reaction causing severe rash involving mucus membranes or skin necrosis: unknown Has patient had a PCN reaction that required hospitalization NO Has patient had a PCN reaction occurring within the last 10 years: NO If all of the above answers are "N    Past Medical History  Diagnosis Date  . Cardiomyopathy   . CAD (coronary artery disease)   . HTN (hypertension)     pt denies 08/19/12  . GERD (gastroesophageal reflux disease)   . Hyperplasia, prostate   . Benign neoplasm of colon   . Nephrolithiasis   . Ventral hernia   . HLD (hyperlipidemia)   . Parkinson disease ([place])     1999  . Complication of anesthesia     pt states  that he got a rash  . Shortness of breath     Hx: of at all times  . UTI (lower urinary tract infection) 09/15/12    Klebsiella  . Asthma   . DM (diabetes mellitus) ([place])     TYPE 2   . Hyperthyroidism     thyroid lobectomy  . Arthritis     cane  . AICD (automatic cardioverter/defibrillator) present     Dr Lovena Le office visit yearly  . 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 Sleep apnea   . OSA (obstructive sleep apnea)     AHI-28,on CPAP, noncompliant with CPAP  . Sleep apnea, organic   . MI (myocardial infarction) [facility]     Dr Stanford Breed 2000, x3vessels bypass    Past Surgical History  Procedure Laterality Date  . Coronary artery bypass graft  2000    [name], MD  . Coronary stent placement  1998  . Median sternotomy  2000  . Deep brain stimulator placement  2004    Right and left VIN stimulator placement (parkinsons)  . 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 Finger amputation      left pointer  . Cataract extraction w/ intraocular lens implant      Hx: of right eye  . Tonsillectomy    . Colonoscopy w/ biopsies and polypectomy      Hx: of  . Cardiac catheterization    . Subthalamic stimulator battery replacement N/A 09/05/2012    Procedure: Deep brain stimulator battery change;  Surgeon: Erline Levine, MD;  Location: [facility];  Service: Neurosurgery;  Laterality: N/A;  Deep brain stimulator battery change  . Left and right heart catheterization with coronary angiogram N/A 09/24/2013    Procedure: LEFT AND RIGHT HEART CATHETERIZATION WITH CORONARY ANGIOGRAM;  Surgeon: Burnell Blanks, MD;  Location: [facility] CATH LAB;  Service: Cardiovascular;  Laterality: N/A;  . Implantable cardioverter defibrillator implant N/A 11/13/2013    Procedure: IMPLANTABLE CARDIOVERTER DEFIBRILLATOR IMPLANT;  Surgeon: Evans Lance, MD;  Location: [facility] CATH LAB;  Service: Cardiovascular;  Laterality: N/A;  . Eye surgery    . Insert / replace / remove  pacemaker    . Colonoscopy N/A 10/13/2014    Procedure: COLONOSCOPY;  Surgeon: Jerene Bears, MD;  Location: [facility] ENDOSCOPY;  Service: Gastroenterology;  Laterality: N/A;    Family History  Problem Relation Age of Onset  . Peripheral vascular disease    . Arthritis    . Alcoholism Mother 6    deceased   . Aneurysm Father 4  . Autoimmune disease Brother 65    AIDS  . Diabetes Neg Hx   . Heart disease Neg Hx   . Colon cancer Neg Hx     Social History:  reports that he has never smoked. He has never used smokeless tobacco. He reports that he drinks alcohol. He reports that he does not use illicit drugs.    Review of Systems     Most recent eye exam was done in 11/15       Lipids: On Lipitor 40 mg, tends to have high triglycerides and low HDL; did have CABG in the year 2000      Lab Results  Component Value Date   CHOL 147 05/19/2014   HDL 35.50* 05/19/2014   LDLCALC 126* 04/08/2013   LDLDIRECT 86.0 05/19/2014   TRIG 213.0* 05/19/2014   CHOLHDL 4 05/19/2014                  Thyroid:   He has had hypothyroidism following radioactive iodine treatment for hyperthyroidism in 2013  TSH is normal with  dosage of 125 g   Lab Results  Component Value Date   TSH 2.06 12/15/2014   TSH 3.57 08/20/2014   TSH 4.60* 01/13/2014   FREET4 0.82 08/20/2014   FREET4 0.98 11/20/2011   FREET4 0.91 10/16/2011       The blood pressure has been followed by [facility], currently taking losartan and 25 mg Aldactone He does take potassium supplements with his Lasix, taking this for cardiomyopathy  Lab Results  Component Value Date   CREATININE 1.19 12/15/2014   BUN 16 12/15/2014   NA 139 12/15/2014   K 4.1 12/15/2014   CL 99 12/15/2014   CO2 31 12/15/2014     Diabetic foot exam in 1/16 showed shows normal monofilament sensation in the toes except decreased on the right great toe and plantar surfaces, no skin lesions or ulcers on the feet and normal pedal pulses    Physical  Examination:  BP 122/74 mmHg  Pulse 87  Temp(Src) 97.9 F (36.6 C)  Resp 14  Ht '5\' 8"'$  (1.727 m)  Wt 262 lb 3.2 oz (118.933 kg)  BMI 39.88 kg/m2  SpO2 98%     ASSESSMENT:  Diabetes type 2, uncontrolled with obesity    See history of present illness for detailed discussion of his current management, blood sugar patterns and problems identified His blood sugar monitor was downloaded and blood sugar patterns analyzed and discussed with patient and his wife  Although he had previously done very well with starting the V-go pump his fasting blood sugars are significantly higher from poor diet and snacking during the night without boluses He does not have as much consistent hyperglycemia during the day and evenings with blood sugar usually averaging under 170 after meals; however does need at least 1 more click on his pump for larger meals with more carbohydrate Not clear if his higher readings may be partly related to stopping Amaryl on the last visit His A1c surprisingly still fairly good at 7%, may have had high sugars only recently  Likely is still benefiting from using Victoza However unable to exercise or lose weight  HYPOTHYROIDISM: TSH is normal  Hyperlipidemia: LDL still over 70, followed by PCP and cardiologist, may consider Crestor for better efficacy  PLAN:   He will increase his boluses to 2 units for large meals and if eating more carbohydrate  Cut back on snacks and sweets at night and eat lower glycemic index foods  Start back on Amaryl 4 mg at night  No change in the basal rate as yet  Call if blood sugar consistently high in the morning  Discussed that if he has difficulty affording the Humalog he may try the [employer] brand regular insulin   Patient Instructions  Avoid sweets and night snacks  Glimeperide '4mg'$  at supper  For snacks may need 1-3 clicks   Check blood sugars on waking up 3-4  times a week Also check blood sugars about 2 hours after a meal  and do this after different meals by rotation  Recommended blood sugar levels on waking up is 90-130 and about 2 hours after meal is 130-160  Please bring your blood sugar monitor to each visit, thank you      Counseling time on subjects discussed above is over 50% of today's 25 minute visit  Falen Lehrmann 12/27/2014, 3:52 PM   Note: This office note was prepared with Dragon voice recognition system technology. Any transcriptional errors that result from this process are unintentional.

## 2014-12-29 DIAGNOSIS — M545 Low back pain: Secondary | ICD-10-CM | POA: Diagnosis not present

## 2014-12-31 DIAGNOSIS — M545 Low back pain: Secondary | ICD-10-CM | POA: Diagnosis not present

## 2015-01-01 ENCOUNTER — Other Ambulatory Visit: Payer: Self-pay | Admitting: Cardiology

## 2015-01-05 DIAGNOSIS — M545 Low back pain: Secondary | ICD-10-CM | POA: Diagnosis not present

## 2015-01-05 NOTE — Telephone Encounter (Signed)
REFILL 

## 2015-01-06 ENCOUNTER — Encounter: Payer: Self-pay | Admitting: Internal Medicine

## 2015-01-12 DIAGNOSIS — M545 Low back pain: Secondary | ICD-10-CM | POA: Diagnosis not present

## 2015-01-14 DIAGNOSIS — M545 Low back pain: Secondary | ICD-10-CM | POA: Diagnosis not present

## 2015-01-19 DIAGNOSIS — M545 Low back pain: Secondary | ICD-10-CM | POA: Diagnosis not present

## 2015-01-19 DIAGNOSIS — M47817 Spondylosis without myelopathy or radiculopathy, lumbosacral region: Secondary | ICD-10-CM | POA: Diagnosis not present

## 2015-01-21 ENCOUNTER — Ambulatory Visit (INDEPENDENT_AMBULATORY_CARE_PROVIDER_SITE_OTHER): Payer: Federal, State, Local not specified - PPO | Admitting: Family Medicine

## 2015-01-21 ENCOUNTER — Encounter: Payer: Self-pay | Admitting: Family Medicine

## 2015-01-21 VITALS — BP 110/72 | HR 81 | Temp 98.4°F | Ht 68.0 in | Wt 260.2 lb

## 2015-01-21 DIAGNOSIS — R05 Cough: Secondary | ICD-10-CM

## 2015-01-21 DIAGNOSIS — M4806 Spinal stenosis, lumbar region: Secondary | ICD-10-CM

## 2015-01-21 DIAGNOSIS — R059 Cough, unspecified: Secondary | ICD-10-CM

## 2015-01-21 DIAGNOSIS — M545 Low back pain: Secondary | ICD-10-CM | POA: Diagnosis not present

## 2015-01-21 DIAGNOSIS — G20 Parkinson's disease: Secondary | ICD-10-CM | POA: Diagnosis not present

## 2015-01-21 DIAGNOSIS — M48061 Spinal stenosis, lumbar region without neurogenic claudication: Secondary | ICD-10-CM

## 2015-01-21 MED ORDER — TRAMADOL HCL 50 MG PO TABS: 50.0000 mg | ORAL_TABLET | Freq: Two times a day (BID) | ORAL | Status: DC | PRN

## 2015-01-21 MED ORDER — GLIMEPIRIDE 2 MG PO TABS: ORAL_TABLET | ORAL | Status: DC

## 2015-01-21 NOTE — Progress Notes (Signed)
Pre visit review using our clinic review tool, if applicable. No additional management support is needed unless otherwise documented below in the visit note.  New patient to me, to est care.   PD, per neuro.  With DBS and compliant with current meds w/o ADE.  Not acting out dreams with current meds.  Gait fairly symmetric and minimal tremor.  Has neuro f/u pending.   Spinal stenosis.  Has seen Dr. Ron Agee.  Still on tramadol w/o ADE.  Path/phys d/w pt, along with anatomy.  Not having pain sitting, but has pain with standing.  Relieved again by sitting.  No sciatica.  Has home PT exercises to do.    Cough.  Occ white sputum w/o fevers.  Not SOB.    PMH and SH reviewed  ROS: See HPI, otherwise noncontributory.  Meds, vitals, and allergies reviewed.   GEN: nad, alert and oriented, flat affect as expected.  HEENT: mucous membranes moist NECK: supple w/o LA CV: rrr. PULM: ctab, no inc wob ABD: soft, +bs EXT: trace BLE edema SKIN: no acute rash

## 2015-01-21 NOTE — Patient Instructions (Signed)
Use your inhalers and if the cough gets worse then let me know.  Use tramadol as needed for pain and keep working on your exercises.  Take care.  Glad to see you.   I'll await the notes from your other docs at your follow up appointments.

## 2015-01-22 ENCOUNTER — Other Ambulatory Visit: Payer: Self-pay | Admitting: Endocrinology

## 2015-01-26 DIAGNOSIS — R059 Cough, unspecified: Secondary | ICD-10-CM | POA: Insufficient documentation

## 2015-01-26 DIAGNOSIS — M545 Low back pain: Secondary | ICD-10-CM | POA: Diagnosis not present

## 2015-01-26 DIAGNOSIS — M48061 Spinal stenosis, lumbar region without neurogenic claudication: Secondary | ICD-10-CM | POA: Insufficient documentation

## 2015-01-26 DIAGNOSIS — R05 Cough: Secondary | ICD-10-CM | POA: Insufficient documentation

## 2015-01-26 NOTE — Assessment & Plan Note (Signed)
Per neuro 

## 2015-01-26 NOTE — Assessment & Plan Note (Signed)
weight stable, doesn't appear to be in failure or distress.  Will hse his baseline inhalers and if the cough gets worse then let me know.  He agrees.  Okay for outpatient f/u.

## 2015-01-26 NOTE — Assessment & Plan Note (Signed)
At L3-4- there is moderately severe spinal stenosis.  D/w pt.  Continue home exercises and tramadol prn.  >25 minutes spent in face to face time with patient, >50% spent in counselling or coordination of care.

## 2015-01-28 DIAGNOSIS — M545 Low back pain: Secondary | ICD-10-CM | POA: Diagnosis not present

## 2015-01-29 ENCOUNTER — Other Ambulatory Visit: Payer: Self-pay | Admitting: Cardiology

## 2015-02-01 NOTE — Telephone Encounter (Signed)
REFILL 

## 2015-02-02 DIAGNOSIS — M545 Low back pain: Secondary | ICD-10-CM | POA: Diagnosis not present

## 2015-02-04 ENCOUNTER — Ambulatory Visit (INDEPENDENT_AMBULATORY_CARE_PROVIDER_SITE_OTHER): Payer: Federal, State, Local not specified - PPO | Admitting: Neurology

## 2015-02-04 ENCOUNTER — Encounter: Payer: Self-pay | Admitting: Neurology

## 2015-02-04 VITALS — BP 130/80 | HR 92 | Ht 68.0 in | Wt 258.0 lb

## 2015-02-04 DIAGNOSIS — G4752 REM sleep behavior disorder: Secondary | ICD-10-CM

## 2015-02-04 DIAGNOSIS — G20 Parkinson's disease: Secondary | ICD-10-CM | POA: Diagnosis not present

## 2015-02-04 DIAGNOSIS — Z9689 Presence of other specified functional implants: Secondary | ICD-10-CM

## 2015-02-04 DIAGNOSIS — M545 Low back pain: Secondary | ICD-10-CM | POA: Diagnosis not present

## 2015-02-04 NOTE — Progress Notes (Signed)
Henry [name] was seen today in the movement disorders clinic for neurologic consultation at the request of [name].  His PCP is [name], MD.  The consultation is for the evaluation of PD and to manage his DBS.  He is accompanied by his wife who supplements the history.  The first symptom(s) the patient noticed was right hand tremor in 1999.  He was seen by neurology and was dx with PD.  He was placed on something, but it caused sleepiness.  He does not think that he has ever been on levodopa.   He was placed on Mirapex, which seemed to help.  He only takes it twice per day.  He didn't think it made a difference when he took it tid.  He began to have tremor and it was suggested he do DBS.    The pt is s/p stn DBS in 2005.  He had a battery change in 2009.    10/02/12 update: The patient is accompanied today by his daughter, who supplements the history.  I reviewed medical records available to me since last visit.  The patient had his generator changed on 09/05/2012.  He remains on pramipexole, 0.5 mg twice per day.  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 [name] in that regard.  He is not compliant with CPAP so says that he doesn't want to return to her for f/u. He doesn't take the klonopin faithfully.  10/11/12 update:  Pt was seen as a walk in/work in today.  Pt had increasing tremor, L greater than R for 3 days.  Thinks that it started with the d/c of artane.  Speech stable.  11/19/12 update:  Pt is seen today for his PD, accompanied by his daughter who supplements the hx.    He is currently on klonopin 0.5 mg - 1/2-1 tablet q hs.  He only takes it when his wife is not working.  She works nights and is only in 2 days per week.  He has some reluctance to take it other nights.  He is on pramipexole 0.5 mg bid.  Last visit, his DBS was reset more similar to the settings he had prior to coming here, just with an increased voltage.  About 2 weeks after our last  visit, the patient decided to go back on the Artane.  The combination of the Artane and the DBS changes helped significantly.  He does ask if I can slightly increased voltage on the left hand, as he has some tremor at night that is bothersome.  Otherwise, he is doing well.  He feels that his balance has been great.  02/19/13 update:  This patient is accompanied in the office by his child who supplements the history.  The pt has a hx of PD.  He has been tremor free and is very happy about that.  He has had some increased balance loss; the last fall was a few months ago but he hasn't gotten hurt.  Was considering hernia surgery.  The records that were made available to me were reviewed.  He is holding on that for now but plans to have it done before end of year.  No hallucinations.  He is still having some acting out of the dreams, despite clonazepam.  06/19/13 update:  Patient is returning to followup regarding his Parkinson's disease.  I had the opportunity to review records since last visit.  He went to the emergency room on 05/12/2013   with chest pain and shortness of breath.  He had been fishing and had missed several doses of Lasix.  He had been eating seafood.  He ended up with an acute exacerbation of his chronic congestive heart failure.  Once he was diuresed and back on his medications, he is feeling better.  He just had a nuclear medicine study done on 06/17/2013 and the ejection fraction on this looked better than on his echocardiogram.  The left ventricular ejection fraction was 41%.  Prior to that, there was some concern that his left ventricular ejection fraction had dropped so much that he may need an ICD.  In terms of Parkinson's disease, the patient states that he has been doing very well in terms of tremor, but his walking really has deteriorated.  He describes a festinating gait, with much more shuffling.  He fell walking over his dog gate but otherwise has not had falls.  He went fishing  yesterday and states that he "stumbled all day long."  No hallucinations.  He has been trying to be more faithful with his CPAP. He remains on Mirapex 0.5 mg twice a day, Artane 2 mg twice a day.  10/23/13 update:  The patient returns today to the clinic, accompanied by his wife who supplements the history.  From a Parkinson's standpoint, the patient states that he has been doing very well.  No tremor.  He started on levodopa last visit and states that he has had no falls and overall stumbling has been much better.  He states that the only time that he stumbles is if he is inside of his fishing boat, and states that that is really because it is small and unstable.  He is taking his levodopa in the morning, after lunch and bedtime.  He has had no hallucinations.  His wife states that he is still draining and acting out the dreams and even fell out of bed a few days ago.  However, he is taking his clonazepam about 10 PM but doesn't go to bed until 1 AM.  He is not using his CPAP.  Is having significant constipation.   I reviewed his cardiology records since last visit.  He did have a heart catheterization and is scheduled to have an ICD placed on November 5.  02/23/14 update:  Pt returns for follow up accompanied by his wife who supplements the history.  The records that were made available to me were reviewed since last visit.  He had an ICD placed on 11/15/13.  He is doing well.  Taking carbidopa/levodopa 25/100 three times a day and remains on artane as well as mirapex 0.5 mg bid.  Had a fall up the stairs.  Wife describes festinating gate and pt thinks that levodopa contributes.  He also doesn't feel good much of the time and thinks that is from levodopa.  Admits, however, that when BS under good control, he feels good.  Has started seeing endocrinology and started on new meds.  He has klonopin for RBD.  Still vivid dreams but no falling out bed (in recliner now).  Having some word finding trouble and memory  doesn't seem clear.  Also, put back tailgate down on car and came down on generator and wants me to look at that.    07/01/14 update:  The patient follows up today regarding his Parkinson's disease.  He is accompanied by his wife who supplements the history.  Last visit, the patient was complaining about memory change which I  thought was likely multifactorial and due to medication such as Artane, Mirapex and Ultram, which he was taking every 3 hours.  The patient, however, thought that it was from levodopa and so we held it and the patient reported that he felt better and therefore continued to stay off of the medication.  He reports that falls are better after d/c levodopa but wife reports still falls.  Pt states that he only fell in shower after he d/c levodopa.  His wife does admit that she thinks that levodopa was causing loss of balance.  He is having more tremor over the last month on the L hand.   He remains on clonazepam 0.5 mg, 1-1/2 tablets at night for REM behavior disorder.  I reviewed records since last visit.  He has seen Dr. Dwyane Dee multiple times in regards to his uncontrolled diabetes.  He started on insulin on May 22 2014.  He states that he is doing well with injecting himself.  Having a lot of constipation.  Has the rancho recipe.    11/03/14 update:  The patient is following up today, accompanied by his wife who supplements the history.  Records were reviewed since our last visit.  The patient is on pramipexole 0.5 mg twice a day and Artane 2 mg twice a day.  He has not had any hallucinations.  Had a fall the other day walking up stairs carrying something and fell backward.  Hit his back.  No LOC.  Also fell backward in the summer around the pool.  [place] one time out of his boat.  Doesn't want to use a walker. He was in the emergency room recently with a rash that was felt secondary to insect bites that he got in the woods.  He also recently had a colonoscopy.  His diabetes is under better  control and his last A1c was 7.2.  States that he now has an insulin pump.  He remains on clonazepam 0.5 mg, 1-1/2 tablets at night for REM behavior disorder.  More back pain lately and will start injections for the back which have helped previously  02/04/15 update:  The patient presents today, accompanied by his wife who supplements the history.  I have reviewed prior records made available to me.  He has established a new primary care doctor in Dr. Damita Dunnings.  The patient remains on Artane, 2 mg twice a day as well as pramipexole 0.5 mg twice a day.  Overall, he has been doing fairly well.  He occasionally still has some tremor.  He had 2 falls since our last visit; he states that he was pushing a gate open and it came back and hit him.  He was also carrying wood the other day and fell forward with wood.  No hallucinations.  No lightheadedness or near syncope.  He remains on clonazepam 0.5 mg, 1-1/2 tablets at night for REM behavior disorder.  [facility].    PREVIOUS MEDICATIONS: Mirapex and artane  ALLERGIES:   Allergies  Allergen Reactions  . Penicillins Rash    Because of a history of documented adverse serious drug reaction;Medi Alert bracelet  is recommended Has patient had a PCN reaction causing immediate rash, facial/tongue/throat swelling, SOB or lightheadedness with hypotension: Yes Has patient had a PCN reaction causing severe rash involving mucus membranes or skin necrosis: unknown Has patient had a PCN reaction that required hospitalization NO Has patient had a PCN reaction occurring within the last 10 years: NO  If all of the above answers are "N    CURRENT MEDICATIONS:     Medication List       This list is accurate as of: 02/04/15  3:19 PM.  Always use your most recent med list.               acetaminophen 325 MG tablet  Commonly known as:  TYLENOL  Take 1-2 tablets (325-650 mg total) by mouth every 4 (four) hours as needed for mild pain.      albuterol 108 (90 Base) MCG/ACT inhaler  Commonly known as:  PROVENTIL HFA;VENTOLIN HFA  Inhale 2 puffs into the lungs every 6 (six) hours as needed for wheezing or shortness of breath.     aspirin EC 81 MG tablet  Take 81 mg by mouth daily.     atorvastatin 40 MG tablet  Commonly known as:  LIPITOR  TAKE 1 TABLET (40 MG TOTAL) BY MOUTH DAILY.     clonazePAM 0.5 MG tablet  Commonly known as:  KLONOPIN  TAKE 1 & 1/2 TABLETS AT BEDTIME     Fluticasone-Salmeterol 250-50 MCG/DOSE Aepb  Commonly known as:  ADVAIR  Inhale 1 puff into the lungs 2 (two) times daily as needed (for copd symptoms).     furosemide 80 MG tablet  Commonly known as:  LASIX  Take 1.5 tablets by mouth daily.     glimepiride 4 MG tablet  Commonly known as:  AMARYL  TAKE 1 TABLET BY MOUTH EVERY DAY IN THE MORNING AND 1/2 AT SUPPER     glucose blood test strip  Commonly known as:  ONE TOUCH ULTRA TEST  Use as instructed to check blood sugar 2 times a day dx code E11.59     ibuprofen 200 MG tablet  Commonly known as:  ADVIL,MOTRIN  Take 800 mg by mouth daily as needed for pain.     insulin lispro 100 UNIT/ML injection  Commonly known as:  HUMALOG  Use max 56 units per day with V-go pump     levothyroxine 125 MCG tablet  Commonly known as:  SYNTHROID, LEVOTHROID  TAKE 1 TABLET EVERY DAY DAILY BEFORE BREAKFAST     Liraglutide 18 MG/3ML Sopn  Commonly known as:  VICTOZA  INJECT 1.8 MG ONCE A DAY     metFORMIN 1000 MG tablet  Commonly known as:  GLUCOPHAGE  Take 1 tablet (1,000 mg total) by mouth 2 (two) times daily with a meal.     metoprolol succinate 50 MG 24 hr tablet  Commonly known as:  TOPROL-XL  TAKE 1 TABLET BY MOUTH EVERY DAY     multivitamin with minerals Tabs tablet  Take 1 tablet by mouth daily. Antioxidant  Multi vitamin     nitroGLYCERIN 0.4 MG SL tablet  Commonly known as:  NITROSTAT  Place 1 tablet (0.4 mg total) under the tongue every 5 (five) minutes as needed for chest pain.      NOVOTWIST 32G X 5 MM Misc  Generic drug:  Insulin Pen Needle  USE ONE PER DAY TO INJECT VICTOZA     potassium chloride 10 MEQ tablet  Commonly known as:  KLOR-CON M10  Take 3 tablets (30 mEq total) by mouth daily.     pramipexole 0.5 MG tablet  Commonly known as:  MIRAPEX  Take 0.5 mg by mouth 2 (two) times daily.     spironolactone 25 MG tablet  Commonly known as:  ALDACTONE  TAKE 1 TABLET (25 MG TOTAL) BY MOUTH  DAILY.     traMADol 50 MG tablet  Commonly known as:  ULTRAM  Take 1 tablet (50 mg total) by mouth 2 (two) times daily as needed.     travoprost (benzalkonium) 0.004 % ophthalmic solution  Commonly known as:  TRAVATAN  Place 1 drop into the right eye at bedtime.     trihexyphenidyl 2 MG tablet  Commonly known as:  ARTANE  Take 1 tablet (2 mg total) by mouth 2 (two) times daily.     V-GO 20 Kit  Use one per day     vitamin B-12 1000 MCG tablet  Commonly known as:  CYANOCOBALAMIN  Take 1,000 mcg by mouth daily.         PAST MEDICAL HISTORY:   Past Medical History  Diagnosis Date  . Cardiomyopathy   . CAD (coronary artery disease)   . HTN (hypertension)     pt denies 08/19/12  . GERD (gastroesophageal reflux disease)   . Hyperplasia, prostate   . Benign neoplasm of colon   . Nephrolithiasis   . Ventral hernia   . HLD (hyperlipidemia)   . Parkinson disease ([place])     1999  . Complication of anesthesia     pt states that he got a rash  . Shortness of breath     Hx: of at all times  . UTI (lower urinary tract infection) 09/15/12    Klebsiella  . Asthma   . DM (diabetes mellitus) ([place])     TYPE 2   . Hyperthyroidism     thyroid lobectomy  . Arthritis     cane  . AICD (automatic cardioverter/defibrillator) present     Dr Lovena Le office visit yearly  . 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 Sleep apnea   . OSA (obstructive sleep apnea)     AHI-28,on CPAP, noncompliant with CPAP  . Sleep apnea, organic   . MI (myocardial  infarction) [facility])     Dr Stanford Breed 2000, x3vessels bypass    PAST SURGICAL HISTORY:   Past Surgical History  Procedure Laterality Date  . Coronary artery bypass graft  2000    [name], MD  . Coronary stent placement  1998  . Median sternotomy  2000  . Deep brain stimulator placement  2004    Right and left VIN stimulator placement (parkinsons)  . 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 Finger amputation      left pointer  . Cataract extraction w/ intraocular lens implant      Hx: of right eye  . Tonsillectomy    . Colonoscopy w/ biopsies and polypectomy      Hx: of  . Cardiac catheterization    . Subthalamic stimulator battery replacement N/A 09/05/2012    Procedure: Deep brain stimulator battery change;  Surgeon: Erline Levine, MD;  Location: [facility];  Service: Neurosurgery;  Laterality: N/A;  Deep brain stimulator battery change  . Left and right heart catheterization with coronary angiogram N/A 09/24/2013    Procedure: LEFT AND RIGHT HEART CATHETERIZATION WITH CORONARY ANGIOGRAM;  Surgeon: Burnell Blanks, MD;  Location: [facility] CATH LAB;  Service: Cardiovascular;  Laterality: N/A;  . Implantable cardioverter defibrillator implant N/A 11/13/2013    Procedure: IMPLANTABLE CARDIOVERTER DEFIBRILLATOR IMPLANT;  Surgeon: Evans Lance, MD;  Location: [facility] CATH LAB;  Service: Cardiovascular;  Laterality: N/A;  . Eye surgery    . Insert / replace / remove  pacemaker    . Colonoscopy N/A 10/13/2014    Procedure: COLONOSCOPY;  Surgeon: Jerene Bears, MD;  Location: [facility] ENDOSCOPY;  Service: Gastroenterology;  Laterality: N/A;    SOCIAL HISTORY:   Social History   Social History  . Marital Status: Married    Spouse Name: [name]  . Number of Children: 2  . Years of Education: N/A   Occupational History  . 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 Smoking status: Never Smoker   . Smokeless tobacco: Never Used  . Alcohol Use: Yes      Comment: occasional wine  . Drug Use: No  . Sexual Activity: Not on file   Other Topics Concern  . Not on file   Social History Narrative   From [facility]   Retired/disability Clinical research associate   Likes to fish.     Married 1972   3 kids   [place] fan    FAMILY HISTORY:   Family Status  Relation Status Death Age  . Mother Deceased     complications of surgery  . Father Deceased     EtOHism  . Brother Alive     1 brother, 1 half brother  . Sister Alive     2 half sisters  . Child Alive     3, alive and well    ROS:  A complete 10 system review of systems was obtained and was unremarkable apart from what is mentioned above.  PHYSICAL EXAMINATION:    VITALS:   Filed Vitals:   02/04/15 1303  BP: 130/80  Pulse: 92  Height: '5\' 8"'$  (1.727 m)  Weight: 258 lb (117.028 kg)    GEN:  The patient appears stated age and is in NAD. HEENT:  Normocephalic, atraumatic.  The mucous membranes are moist. The superficial temporal arteries are without ropiness or tenderness. CV:  RRR Lungs:  CTAB Neck/HEME:  There are no carotid bruits bilaterally.   Neurological examination:  Orientation: The patient is alert and oriented x3.  Cranial nerves: There is good facial symmetry.  The visual fields are full to confrontational testing. The speech is fluent and just mildly dysarthric.  He is hypophonic. Soft palate rises symmetrically and there is no tongue deviation. Hearing is intact to conversational tone. Motor: Strength is 5/5 in the bilateral upper and lower extremities.   Shoulder shrug is equal and symmetric.  There is no pronator drift.  Movement examination: Tone: There is normal tone in the UE and LE bilaterally Abnormal movements: There is rare L thumb tremor and L leg tremor Coordination:  There is no decremation with RAM's today Gait and Station: The patient has minimal difficulty arising out of a deep-seated chair without the use of the hands. The patient's  stride length is decreased  LABS  Lab Results  Component Value Date   WBC 6.9 11/10/2013   HGB 12.8* 11/10/2013   HCT 39.5 11/10/2013   MCV 87.4 11/10/2013   PLT 152.0 11/10/2013   Lab Results  Component Value Date   TSH 2.06 12/15/2014     Chemistry      Component Value Date/Time   NA 139 12/15/2014 1338   K 4.1 12/15/2014 1338   CL 99 12/15/2014 1338   CO2 31 12/15/2014 1338   BUN 16 12/15/2014 1338   CREATININE 1.19 12/15/2014 1338   CREATININE 1.04 06/05/2013 1020      Component Value Date/Time   CALCIUM 9.4 12/15/2014  1338   ALKPHOS 65 12/15/2014 1338   AST 14 12/15/2014 1338   ALT 24 12/15/2014 1338   BILITOT 0.5 12/15/2014 1338     Lab Results  Component Value Date   VITAMINB12 268 08/19/2012   Lab Results  Component Value Date   HGBA1C 7.0* 12/15/2014   DBS programming was performed today, which is described in more detail on a separate programming procedural notes. No tremor after programming  ASSESSMENT/PLAN:  1.  Idiopathic Parkinson's disease.    -I again talked to the patient about the Artane, which he is taking 2 mg twice a day.  I expressed to him that this can cause loss of balance and confusion.  However, when we tried to stop it in the past, he was not able to tolerate tremor.    -The patient will continue on pramipexole, 0.5 mg twice a day.   -[facility] is [facility] to [facility] at [facility].  States that biggest issue is now his LBP and that is limiting him and his therapy more than his PD   -His DBS battery was last changed on 08/28/204.  Battery starting to show wear but should hopefully last the better part of this year.  He and I discussed this today.  Will monitor closely  2.  RBD  -He will continue his clonazepam 0.5 mg, to 1-1/2 tablets per night .   3.  OSAS, noncompliant with CPAP.  -Unfortunately, he has been unable to tolerate CPAP 4.  Sialorrhea  -This is associated with Parkinson's disease.  He may benefit from Myobloc in the  future. 5.  Dysphagia.  -Overall, this is fairly mild but I will give an eye on this and consider a MBE if needed. 6.  CHF  -ICD placed on 11/15/2013 7.  Constipation  -copy of rancho recipe given although he still has the problem. 8.  Uncontrolled DM  -Doing much better with insulin pump 9.  Follow-up in the next few months, sooner should new neurologic issues arise.

## 2015-02-04 NOTE — Procedures (Signed)
DBS Programming was performed.    Total time spent programming was 30 minutes.  Device was confirmed to be on.  Soft start was confirmed to be on.  Impedences were checked and were within normal limits.  Battery was checked and was determined to be functioning normally and not near the end of life.  Detailed analysis on separate neurophysiologic worksheet.    Final settings were as follows:  Left brain electrode:     1-2+           ; Amplitude  4.0   V   ; Pulse width 90 microseconds;   Frequency   160   Hz.  Right brain electrode:     4-7+          ; Amplitude   4.3  V ;  Pulse width 90  microseconds;  Frequency   160    Hz.

## 2015-02-11 DIAGNOSIS — M545 Low back pain: Secondary | ICD-10-CM | POA: Diagnosis not present

## 2015-02-12 ENCOUNTER — Other Ambulatory Visit: Payer: Self-pay | Admitting: Internal Medicine

## 2015-02-13 ENCOUNTER — Other Ambulatory Visit: Payer: Self-pay | Admitting: Neurology

## 2015-02-15 NOTE — Telephone Encounter (Signed)
Artane refill requested. Per last office note- patient to remain on medication. Refill approved and sent to patient's pharmacy.   

## 2015-02-18 DIAGNOSIS — H4030X1 Glaucoma secondary to eye trauma, unspecified eye, mild stage: Secondary | ICD-10-CM | POA: Diagnosis not present

## 2015-02-18 DIAGNOSIS — H26491 Other secondary cataract, right eye: Secondary | ICD-10-CM | POA: Diagnosis not present

## 2015-02-18 DIAGNOSIS — Z961 Presence of intraocular lens: Secondary | ICD-10-CM | POA: Diagnosis not present

## 2015-02-18 DIAGNOSIS — H35341 Macular cyst, hole, or pseudohole, right eye: Secondary | ICD-10-CM | POA: Diagnosis not present

## 2015-02-18 DIAGNOSIS — H40032 Anatomical narrow angle, left eye: Secondary | ICD-10-CM | POA: Diagnosis not present

## 2015-02-18 DIAGNOSIS — H524 Presbyopia: Secondary | ICD-10-CM | POA: Diagnosis not present

## 2015-02-18 LAB — HM DIABETES EYE EXAM

## 2015-02-22 ENCOUNTER — Encounter: Payer: Self-pay | Admitting: Endocrinology

## 2015-02-22 ENCOUNTER — Other Ambulatory Visit: Payer: Self-pay | Admitting: *Deleted

## 2015-02-22 MED ORDER — LEVOTHYROXINE SODIUM 125 MCG PO TABS: ORAL_TABLET | ORAL | Status: DC

## 2015-02-23 DIAGNOSIS — M47817 Spondylosis without myelopathy or radiculopathy, lumbosacral region: Secondary | ICD-10-CM | POA: Diagnosis not present

## 2015-02-23 DIAGNOSIS — M545 Low back pain: Secondary | ICD-10-CM | POA: Diagnosis not present

## 2015-02-25 ENCOUNTER — Encounter: Payer: Self-pay | Admitting: Family Medicine

## 2015-02-26 ENCOUNTER — Other Ambulatory Visit: Payer: Self-pay | Admitting: Internal Medicine

## 2015-02-27 NOTE — Telephone Encounter (Addendum)
I sent the lasix rx.  Will route to Dr. Carles Collet re: mirapex.  I thought his parkinson's meds were coming through her clinic, would appreciate her input (Dr. Carles Collet- if I need to fill this then please let me know).

## 2015-03-03 ENCOUNTER — Ambulatory Visit (INDEPENDENT_AMBULATORY_CARE_PROVIDER_SITE_OTHER): Payer: Federal, State, Local not specified - PPO | Admitting: *Deleted

## 2015-03-03 DIAGNOSIS — I5022 Chronic systolic (congestive) heart failure: Secondary | ICD-10-CM

## 2015-03-03 DIAGNOSIS — I255 Ischemic cardiomyopathy: Secondary | ICD-10-CM | POA: Diagnosis not present

## 2015-03-03 NOTE — Progress Notes (Signed)
Remote ICD transmission.   

## 2015-03-10 ENCOUNTER — Encounter: Payer: Self-pay | Admitting: *Deleted

## 2015-03-11 ENCOUNTER — Other Ambulatory Visit: Payer: Self-pay | Admitting: Cardiology

## 2015-03-15 ENCOUNTER — Ambulatory Visit (INDEPENDENT_AMBULATORY_CARE_PROVIDER_SITE_OTHER): Payer: Federal, State, Local not specified - PPO | Admitting: Family Medicine

## 2015-03-15 ENCOUNTER — Encounter: Payer: Self-pay | Admitting: Family Medicine

## 2015-03-15 VITALS — BP 130/72 | HR 79 | Temp 99.0°F | Wt 256.5 lb

## 2015-03-15 DIAGNOSIS — I255 Ischemic cardiomyopathy: Secondary | ICD-10-CM

## 2015-03-15 DIAGNOSIS — J209 Acute bronchitis, unspecified: Secondary | ICD-10-CM

## 2015-03-15 MED ORDER — FLUTICASONE-SALMETEROL 250-50 MCG/DOSE IN AEPB
1.0000 | INHALATION_SPRAY | Freq: Two times a day (BID) | RESPIRATORY_TRACT | Status: DC
Start: 2015-03-15 — End: 2017-06-29

## 2015-03-15 MED ORDER — AZITHROMYCIN 250 MG PO TABS: ORAL_TABLET | ORAL | Status: DC

## 2015-03-15 MED ORDER — ALBUTEROL SULFATE HFA 108 (90 BASE) MCG/ACT IN AERS: 2.0000 | INHALATION_SPRAY | Freq: Four times a day (QID) | RESPIRATORY_TRACT | Status: DC | PRN

## 2015-03-15 NOTE — Progress Notes (Signed)
Pre visit review using our clinic review tool, if applicable. No additional management support is needed unless otherwise documented below in the visit note.  Sugar has been controlled recently, usually ~120 in the AM. Known parkinson's disease.   Cough and cold sx.  Started about 5 days ago.  Lost voice initially, then noted wheezing at night.  No known fevers.  More ST recently.  Some cough, not as much recently.  Some whitish sputum.  Last night was worse than the nights before, but is some better today- he attributed that to being upright and this being the middle of the day.  Rhinorrhea.  No facial pain.    His weight is down from prev baseline.    Meds, vitals, and allergies reviewed.   ROS: See HPI.  Otherwise, noncontributory.  GEN: nad, alert and oriented, flat affect but that is baseline for patient.   HEENT: mucous membranes moist, tm w/o erythema, nasal exam w/o erythema, clear discharge noted,  OP with cobblestoning NECK: supple w/o LA CV: rrr.   PULM: ctab except for dec BS (slightly) with some rhonchi at the BLL,  no inc wob, no wheeze. EXT: no edema SKIN: no acute rash Nontoxic.

## 2015-03-15 NOTE — Patient Instructions (Signed)
Albuterol as needed- rescue inhaler.  advair- disk- twice daily scheduled- preventive.  Start zithromax in the meantime- antibiotic.  Take care.  Update me as needed.

## 2015-03-15 NOTE — Assessment & Plan Note (Signed)
Presumed, likely not CHF exacerbation.  Okay for outpatient f/u.  Albuterol as needed- as rescue inhaler, dw pt Use advair- twice daily scheduled- preventive, d/w pt Start zithromax in the meantime.   He agrees.  Update me as needed.

## 2015-03-19 ENCOUNTER — Other Ambulatory Visit: Payer: Self-pay | Admitting: Endocrinology

## 2015-03-22 ENCOUNTER — Other Ambulatory Visit (INDEPENDENT_AMBULATORY_CARE_PROVIDER_SITE_OTHER): Payer: Federal, State, Local not specified - PPO

## 2015-03-22 DIAGNOSIS — E1165 Type 2 diabetes mellitus with hyperglycemia: Secondary | ICD-10-CM

## 2015-03-22 DIAGNOSIS — Z794 Long term (current) use of insulin: Secondary | ICD-10-CM | POA: Diagnosis not present

## 2015-03-22 LAB — BASIC METABOLIC PANEL
BUN: 14 mg/dL (ref 6–23)
CO2: 32 mEq/L (ref 19–32)
CREATININE: 1.3 mg/dL (ref 0.40–1.50)
Calcium: 9.2 mg/dL (ref 8.4–10.5)
Chloride: 99 mEq/L (ref 96–112)
GFR: 59.14 mL/min — AB (ref >60.00)
Glucose, Bld: 118 mg/dL — ABNORMAL HIGH (ref 70–99)
Potassium: 4 mEq/L (ref 3.5–5.1)
Sodium: 140 mEq/L (ref 135–145)

## 2015-03-22 LAB — HEMOGLOBIN A1C: Hgb A1c MFr Bld: 6.5 % (ref 4.6–6.5)

## 2015-03-25 ENCOUNTER — Encounter: Payer: Self-pay | Admitting: Endocrinology

## 2015-03-25 ENCOUNTER — Ambulatory Visit (INDEPENDENT_AMBULATORY_CARE_PROVIDER_SITE_OTHER): Payer: Federal, State, Local not specified - PPO | Admitting: Endocrinology

## 2015-03-25 VITALS — BP 128/76 | HR 84 | Temp 98.4°F | Resp 16 | Ht 68.0 in | Wt 254.8 lb

## 2015-03-25 DIAGNOSIS — E1165 Type 2 diabetes mellitus with hyperglycemia: Secondary | ICD-10-CM

## 2015-03-25 DIAGNOSIS — Z794 Long term (current) use of insulin: Secondary | ICD-10-CM | POA: Diagnosis not present

## 2015-03-25 DIAGNOSIS — Z23 Encounter for immunization: Secondary | ICD-10-CM | POA: Diagnosis not present

## 2015-03-25 DIAGNOSIS — I255 Ischemic cardiomyopathy: Secondary | ICD-10-CM

## 2015-03-25 NOTE — Progress Notes (Signed)
Patient ID: Henry [name], male   DOB: [birth year], 65 y.o.   MRN: [id number]           Reason for Appointment: Follow-up for Type 2 Diabetes  Referring physician: Linna Darner  History of Present Illness:          Diagnosis: Type 2 diabetes mellitus, date of diagnosis: 2000      Past history:   Patient thinks he has been taking Amaryl for several years and probably metformin since onset also At some point he was changed from metformin to [facility] and was taking this since at least 2012 A1c had been higher in 2015 Since his A1c had been progressively higher with his regimen of Janumet and Amaryl he was started on Victoza in 01/2014 He was started on Lantus insulin in 5/16 because of persistent hyperglycemia especially fasting; was having readings as high as 293  Recent history:   INSULIN regimen: V-go-20 pump and boluses 4 units  Because of tendency to high postprandial readings and for control with Lantus he was switched to the V-go pump in late 09/2014  He is also on regimen of metformin and Victoza 1.8 mg On his last visit because of his rigidity high fasting readings his Amaryl was restarted  A1c has improved and now down to its 0.5  Continues to use the pump for boluses at meals but usually taking small amounts He also says that because of using less mealtime insulin he can sometimes make his pump last for up to 2 days  Current blood sugar patterns and problems identified:  He is having generally good readings in the mornings compared to previous visit although recently higher with steroids  He does not check readings after supper and not clear what his postprandial readings are during the day also  He thinks he is able to bolus with meals consistently  Has not been getting up at night and eating snacks as before  He has  seen the dietitian in 3/16 Not able to exercise much       Oral hypoglycemic drugs the patient is taking are: Metformin 1 g twice a day  Side effects from  medications have been: None Compliance with the medical regimen: Fair  Hypoglycemia:   none  Glucose monitoring:  done 1-2 times  a day         Glucometer: One Touch.      Blood Glucose readings by recall:   Mean values apply above for all meters except median for One Touch  PRE-MEAL Fasting Lunch Dinner Bedtime Overall  Glucose range: 130-198      Mean/median:         Self-care: The diet that the patient has been following is:  tries to eat more vegetables, Recently less snacks .        Meals: 9am , 2-3 pm and 7 pm    Exercise: unable to do any         Dietician visit, most recent: 03/2014                Weight history: Previous range: 266-277  Wt Readings from Last 3 Encounters:  03/25/15 254 lb 12.8 oz (115.577 kg)  03/15/15 256 lb 8 oz (116.348 kg)  02/04/15 258 lb (117.028 kg)    Glycemic control:   Lab Results  Component Value Date   HGBA1C 6.5 03/22/2015   HGBA1C 7.0* 12/15/2014   HGBA1C 7.2* 08/20/2014   Lab Results  Component Value Date  MICROALBUR <0.7 12/15/2014   LDLCALC 126* 04/08/2013   CREATININE 1.30 03/22/2015    Lab on 03/22/2015  Component Date Value Ref Range Status  . Hgb A1c MFr Bld 03/22/2015 6.5  4.6 - 6.5 % Final   Glycemic Control Guidelines for People with Diabetes:Non Diabetic:  <6%Goal of Therapy: <7%Additional Action Suggested:  >8%   . Sodium 03/22/2015 140  135 - 145 mEq/L Final  . Potassium 03/22/2015 4.0  3.5 - 5.1 mEq/L Final  . Chloride 03/22/2015 99  96 - 112 mEq/L Final  . CO2 03/22/2015 32  19 - 32 mEq/L Final  . Glucose, Bld 03/22/2015 118* 70 - 99 mg/dL Final  . BUN 03/22/2015 14  6 - 23 mg/dL Final  . Creatinine, Ser 03/22/2015 1.30  0.40 - 1.50 mg/dL Final  . Calcium 03/22/2015 9.2  8.4 - 10.5 mg/dL Final  . GFR 03/22/2015 59.14* >60.00 mL/min Final        Medication List       This list is accurate as of: 03/25/15  2:45 PM.  Always use your most recent med list.               acetaminophen 325 MG tablet    Commonly known as:  TYLENOL  Take 1-2 tablets (325-650 mg total) by mouth every 4 (four) hours as needed for mild pain.     albuterol 108 (90 Base) MCG/ACT inhaler  Commonly known as:  PROVENTIL HFA;VENTOLIN HFA  Inhale 2 puffs into the lungs every 6 (six) hours as needed for wheezing or shortness of breath.     aspirin EC 81 MG tablet  Take 81 mg by mouth daily.     atorvastatin 40 MG tablet  Commonly known as:  LIPITOR  TAKE 1 TABLET (40 MG TOTAL) BY MOUTH DAILY.     azithromycin 250 MG tablet  Commonly known as:  ZITHROMAX  2 tabs a day for 1 day and then 1 a day for 4 days.     clonazePAM 0.5 MG tablet  Commonly known as:  KLONOPIN  TAKE 1 & 1/2 TABLETS AT BEDTIME     Fluticasone-Salmeterol 250-50 MCG/DOSE Aepb  Commonly known as:  ADVAIR  Inhale 1 puff into the lungs 2 (two) times daily.     furosemide 80 MG tablet  Commonly known as:  LASIX  TAKE 1.5 TABLETS BY MOUTH DAILY.     glimepiride 4 MG tablet  Commonly known as:  AMARYL  TAKE 1 TABLET BY MOUTH EVERY DAY IN THE MORNING AND 1/2 AT SUPPER     glucose blood test strip  Commonly known as:  ONE TOUCH ULTRA TEST  Use as instructed to check blood sugar 2 times a day dx code E11.59     ibuprofen 200 MG tablet  Commonly known as:  ADVIL,MOTRIN  Take 800 mg by mouth daily as needed for pain.     insulin lispro 100 UNIT/ML injection  Commonly known as:  HUMALOG  Use max 56 units per day with V-go pump     levothyroxine 125 MCG tablet  Commonly known as:  SYNTHROID, LEVOTHROID  TAKE 1 TABLET EVERY DAY DAILY BEFORE BREAKFAST     Liraglutide 18 MG/3ML Sopn  Commonly known as:  VICTOZA  INJECT 1.8 MG ONCE A DAY     metFORMIN 1000 MG tablet  Commonly known as:  GLUCOPHAGE  TAKE 1 TABLET (1,000 MG TOTAL) BY MOUTH 2 (TWO) TIMES DAILY WITH A MEAL.     metoprolol  succinate 50 MG 24 hr tablet  Commonly known as:  TOPROL-XL  TAKE 1 TABLET BY MOUTH EVERY DAY     multivitamin with minerals Tabs tablet  Take 1  tablet by mouth daily. Antioxidant  Multi vitamin     nitroGLYCERIN 0.4 MG SL tablet  Commonly known as:  NITROSTAT  Place 1 tablet (0.4 mg total) under the tongue every 5 (five) minutes as needed for chest pain.     NOVOTWIST 32G X 5 MM Misc  Generic drug:  Insulin Pen Needle  USE ONE PER DAY TO INJECT VICTOZA     potassium chloride 10 MEQ tablet  Commonly known as:  KLOR-CON M10  Take 3 tablets (30 mEq total) by mouth daily.     pramipexole 0.5 MG tablet  Commonly known as:  MIRAPEX  Take 1 tablet (0.5 mg total) by mouth 2 (two) times daily.     spironolactone 25 MG tablet  Commonly known as:  ALDACTONE  TAKE 1 TABLET (25 MG TOTAL) BY MOUTH DAILY.     traMADol 50 MG tablet  Commonly known as:  ULTRAM  Take 1 tablet (50 mg total) by mouth 2 (two) times daily as needed.     travoprost (benzalkonium) 0.004 % ophthalmic solution  Commonly known as:  TRAVATAN  Place 1 drop into the right eye at bedtime.     trihexyphenidyl 2 MG tablet  Commonly known as:  ARTANE  TAKE 1 TABLET TWICE A DAY     V-GO 20 Kit  Use one per day     vitamin B-12 1000 MCG tablet  Commonly known as:  CYANOCOBALAMIN  Take 1,000 mcg by mouth daily.        Allergies:  Allergies  Allergen Reactions  . Penicillins Rash    Because of a history of documented adverse serious drug reaction;Medi Alert bracelet  is recommended Has patient had a PCN reaction causing immediate rash, facial/tongue/throat swelling, SOB or lightheadedness with hypotension: Yes Has patient had a PCN reaction causing severe rash involving mucus membranes or skin necrosis: unknown Has patient had a PCN reaction that required hospitalization NO Has patient had a PCN reaction occurring within the last 10 years: NO If all of the above answers are "N    Past Medical History  Diagnosis Date  . Cardiomyopathy   . CAD (coronary artery disease)   . HTN (hypertension)     pt denies 08/19/12  . GERD (gastroesophageal reflux  disease)   . Hyperplasia, prostate   . Benign neoplasm of colon   . Nephrolithiasis   . Ventral hernia   . HLD (hyperlipidemia)   . Parkinson disease ([place])     1999  . Complication of anesthesia     pt states that he got a rash  . Shortness of breath     Hx: of at all times  . UTI (lower urinary tract infection) 09/15/12    Klebsiella  . Asthma   . DM (diabetes mellitus) ([place])     TYPE 2   . Hyperthyroidism     thyroid lobectomy  . Arthritis     cane  . AICD (automatic cardioverter/defibrillator) present     Dr Lovena Le office visit yearly  . 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 Sleep apnea   . OSA (obstructive sleep apnea)     AHI-28,on CPAP, noncompliant with CPAP  . Sleep apnea, organic   . MI (myocardial infarction) ([place])  Dr Jens Som 2000, x3vessels bypass    Past Surgical History  Procedure Laterality Date  . Coronary artery bypass graft  2000    Tressie Stalker, MD  . Coronary stent placement  1998  . Median sternotomy  2000  . Deep brain stimulator placement  2004    Right and left VIN stimulator placement (parkinsons)  . 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 Finger amputation      left pointer  . Cataract extraction w/ intraocular lens implant      Hx: of right eye  . Tonsillectomy    . Colonoscopy w/ biopsies and polypectomy      Hx: of  . Cardiac catheterization    . Subthalamic stimulator battery replacement N/A 09/05/2012    Procedure: Deep brain stimulator battery change;  Surgeon: Maeola Harman, MD;  Location: [facility] NEURO ORS;  Service: Neurosurgery;  Laterality: N/A;  Deep brain stimulator battery change  . Left and right heart catheterization with coronary angiogram N/A 09/24/2013    Procedure: LEFT AND RIGHT HEART CATHETERIZATION WITH CORONARY ANGIOGRAM;  Surgeon: Kathleene Hazel, MD;  Location: [facility] CATH LAB;  Service: Cardiovascular;  Laterality: N/A;  . Implantable cardioverter defibrillator implant N/A  11/13/2013    Procedure: IMPLANTABLE CARDIOVERTER DEFIBRILLATOR IMPLANT;  Surgeon: Marinus Maw, MD;  Location: [facility] CATH LAB;  Service: Cardiovascular;  Laterality: N/A;  . Eye surgery    . Insert / replace / remove pacemaker    . Colonoscopy N/A 10/13/2014    Procedure: COLONOSCOPY;  Surgeon: Beverley Fiedler, MD;  Location: [facility] ENDOSCOPY;  Service: Gastroenterology;  Laterality: N/A;    Family History  Problem Relation Age of Onset  . Peripheral vascular disease    . Arthritis    . Aneurysm Mother   . Alcoholism Father   . Autoimmune disease Brother 35    AIDS  . Diabetes Neg Hx   . Heart disease Neg Hx   . Colon cancer Neg Hx   . Prostate cancer Neg Hx     Social History:  reports that he has never smoked. He has never used smokeless tobacco. He reports that he drinks alcohol. He reports that he does not use illicit drugs.    Review of Systems     Most recent eye exam was done in 11/15       Lipids: On Lipitor 40 mg, tends to have high triglycerides and low HDL; did have CABG in the year 2000      Lab Results  Component Value Date   CHOL 147 05/19/2014   HDL 35.50* 05/19/2014   LDLCALC 126* 04/08/2013   LDLDIRECT 86.0 05/19/2014   TRIG 213.0* 05/19/2014   CHOLHDL 4 05/19/2014                  Thyroid:   He has had hypothyroidism following radioactive iodine treatment for hyperthyroidism in 2013  TSH is normal with  dosage of 125 g   Lab Results  Component Value Date   TSH 2.06 12/15/2014   TSH 3.57 08/20/2014   TSH 4.60* 01/13/2014   FREET4 0.82 08/20/2014   FREET4 0.98 11/20/2011   FREET4 0.91 10/16/2011       The blood pressure has been followed by [facility], currently taking losartan and 25 mg Aldactone He does take potassium supplements with his Lasix, taking this for cardiomyopathy  Lab Results  Component Value Date   CREATININE 1.30 03/22/2015   BUN 14 03/22/2015  NA 140 03/22/2015   K 4.0 03/22/2015   CL 99 03/22/2015   CO2 32 03/22/2015      Diabetic foot exam in 1/16 showed shows normal monofilament sensation in the toes except decreased on the right great toe and plantar surfaces, no skin lesions or ulcers on the feet and normal pedal pulses    Physical Examination:  BP 128/76 mmHg  Pulse 84  Temp(Src) 98.4 F (36.9 C)  Resp 16  Ht '5\' 8"'$  (1.727 m)  Wt 254 lb 12.8 oz (115.577 kg)  BMI 38.75 kg/m2  SpO2 97%     ASSESSMENT/PLAN  Diabetes type 2, uncontrolled with obesity    See history of present illness for detailed discussion of his current management, blood sugar patterns and problems identified His blood sugar monitor was not downloaded  His blood sugars may have been higher recently because of respiratory infection and steroids, do recommend that he does not get steroids unless absolutely necessary  Although he is doing very well with A1c 6.5% not clear if he is having consistent control of postprandial readings This was discussed and he will start checking blood sugars at least half the time after meals This will help [place] adjust his boluses for meals  He is reporting good compliance with diet although may be still getting some snacks during the day Recently not as active  Since his blood sugars have been not significantly high overnight will not change his basal rate on the pump He can continue to change the V-go pump when it runs out but discussed that if it is running out during the night it may cause high readings in the mornings  Likely is still benefiting from using Victoza and also restarting Amaryl  HYPERTENSION: Still well controlled and appears to be mild  Discussed benefits of PREVNAR and this will be given as he has not had this  Patient Instructions  Check blood sugars on waking up   times a week Also check blood sugars about 2 hours after a meal and do this after different meals by rotation  Recommended blood sugar levels on waking up is 90-130 and about 2 hours after meal is  130-160  Please bring your blood sugar monitor to each visit, thank you      Counseling time on subjects discussed above is over 50% of today's 25 minute visit  Henry [name] 03/25/2015, 2:45 PM   Note: This office note was prepared with Dragon voice recognition system technology. Any transcriptional errors that result from this process are unintentional.

## 2015-03-25 NOTE — Patient Instructions (Signed)
Check blood sugars on waking up   times a week Also check blood sugars about 2 hours after a meal and do this after different meals by rotation  Recommended blood sugar levels on waking up is 90-130 and about 2 hours after meal is 130-160  Please bring your blood sugar monitor to each visit, thank you   

## 2015-03-26 ENCOUNTER — Encounter: Payer: Self-pay | Admitting: Cardiology

## 2015-03-26 LAB — CUP PACEART REMOTE DEVICE CHECK
Battery Voltage: 3.01 V
Brady Statistic AS VP Percent: 0.03 %
Brady Statistic RA Percent Paced: 0.95 %
Brady Statistic RV Percent Paced: 0.04 %
Date Time Interrogation Session: 20170222072823
HIGH POWER IMPEDANCE MEASURED VALUE: 75 Ohm
Implantable Lead Location: 753860
Implantable Lead Model: 5076
Lead Channel Impedance Value: 399 Ohm
Lead Channel Impedance Value: 494 Ohm
Lead Channel Pacing Threshold Pulse Width: 0.4 ms
Lead Channel Sensing Intrinsic Amplitude: 15.75 mV
Lead Channel Setting Pacing Amplitude: 2.75 V
Lead Channel Setting Pacing Pulse Width: 0.4 ms
Lead Channel Setting Sensing Sensitivity: 0.3 mV
MDC IDC LEAD IMPLANT DT: 20151105
MDC IDC LEAD IMPLANT DT: 20151105
MDC IDC LEAD LOCATION: 753859
MDC IDC MSMT BATTERY REMAINING LONGEVITY: 124 mo
MDC IDC MSMT LEADCHNL RA IMPEDANCE VALUE: 437 Ohm
MDC IDC MSMT LEADCHNL RA PACING THRESHOLD AMPLITUDE: 1.375 V
MDC IDC MSMT LEADCHNL RA SENSING INTR AMPL: 1.875 mV
MDC IDC MSMT LEADCHNL RA SENSING INTR AMPL: 1.875 mV
MDC IDC MSMT LEADCHNL RV PACING THRESHOLD AMPLITUDE: 0.375 V
MDC IDC MSMT LEADCHNL RV PACING THRESHOLD PULSEWIDTH: 0.4 ms
MDC IDC MSMT LEADCHNL RV SENSING INTR AMPL: 15.75 mV
MDC IDC SET LEADCHNL RV PACING AMPLITUDE: 2.5 V
MDC IDC STAT BRADY AP VP PERCENT: 0.01 %
MDC IDC STAT BRADY AP VS PERCENT: 0.94 %
MDC IDC STAT BRADY AS VS PERCENT: 99.02 %

## 2015-03-30 ENCOUNTER — Encounter: Payer: Self-pay | Admitting: Neurology

## 2015-03-31 ENCOUNTER — Encounter: Payer: Self-pay | Admitting: Neurology

## 2015-04-02 DIAGNOSIS — H4030X1 Glaucoma secondary to eye trauma, unspecified eye, mild stage: Secondary | ICD-10-CM | POA: Diagnosis not present

## 2015-04-02 DIAGNOSIS — H40032 Anatomical narrow angle, left eye: Secondary | ICD-10-CM | POA: Diagnosis not present

## 2015-04-02 DIAGNOSIS — H5319 Other subjective visual disturbances: Secondary | ICD-10-CM | POA: Diagnosis not present

## 2015-04-09 ENCOUNTER — Other Ambulatory Visit: Payer: Self-pay | Admitting: Cardiology

## 2015-04-13 ENCOUNTER — Other Ambulatory Visit: Payer: Self-pay | Admitting: *Deleted

## 2015-04-13 MED ORDER — GLIMEPIRIDE 4 MG PO TABS: ORAL_TABLET | ORAL | Status: DC

## 2015-04-22 ENCOUNTER — Encounter: Payer: Self-pay | Admitting: Family Medicine

## 2015-04-22 ENCOUNTER — Other Ambulatory Visit: Payer: Self-pay | Admitting: *Deleted

## 2015-04-22 ENCOUNTER — Other Ambulatory Visit: Payer: Self-pay | Admitting: Endocrinology

## 2015-04-22 MED ORDER — METOPROLOL SUCCINATE ER 50 MG PO TB24: 50.0000 mg | ORAL_TABLET | Freq: Every day | ORAL | Status: DC

## 2015-04-23 ENCOUNTER — Encounter: Payer: Self-pay | Admitting: Family Medicine

## 2015-04-26 ENCOUNTER — Other Ambulatory Visit: Payer: Self-pay | Admitting: *Deleted

## 2015-04-26 MED ORDER — POTASSIUM CHLORIDE CRYS ER 10 MEQ PO TBCR: 30.0000 meq | EXTENDED_RELEASE_TABLET | Freq: Every day | ORAL | Status: DC

## 2015-04-27 ENCOUNTER — Encounter: Payer: Self-pay | Admitting: Internal Medicine

## 2015-04-27 ENCOUNTER — Ambulatory Visit (INDEPENDENT_AMBULATORY_CARE_PROVIDER_SITE_OTHER): Payer: Federal, State, Local not specified - PPO | Admitting: Internal Medicine

## 2015-04-27 VITALS — BP 126/72 | HR 77 | Ht 68.0 in | Wt 264.0 lb

## 2015-04-27 DIAGNOSIS — I429 Cardiomyopathy, unspecified: Secondary | ICD-10-CM

## 2015-04-27 DIAGNOSIS — I255 Ischemic cardiomyopathy: Secondary | ICD-10-CM

## 2015-04-27 NOTE — Progress Notes (Signed)
HPI [name] returns today for followup. He is a pleasant 65 yo man with a h/o chronic systolic heart failure, ICM, s/p ICD implantation. In the interim, he has been stable and he denies any ICD therapy.  No chest pain. He has class 2 sob. No worsening edema. Allergies  Allergen Reactions  . Penicillins Rash    Because of a history of documented adverse serious drug reaction;Medi Alert bracelet  is recommended Has patient had a PCN reaction causing immediate rash, facial/tongue/throat swelling, SOB or lightheadedness with hypotension: Yes Has patient had a PCN reaction causing severe rash involving mucus membranes or skin necrosis: unknown Has patient had a PCN reaction that required hospitalization NO Has patient had a PCN reaction occurring within the last 10 years: NO If all of the above answers are "N     Current Outpatient Prescriptions  Medication Sig Dispense Refill  . acetaminophen (TYLENOL) 325 MG tablet Take 1-2 tablets (325-650 mg total) by mouth every 4 (four) hours as needed for mild pain.    Marland Kitchen albuterol (PROVENTIL HFA;VENTOLIN HFA) 108 (90 Base) MCG/ACT inhaler Inhale 2 puffs into the lungs every 6 (six) hours as needed for wheezing or shortness of breath. 18 g 5  . aspirin EC 81 MG tablet Take 81 mg by mouth daily.    Marland Kitchen atorvastatin (LIPITOR) 40 MG tablet TAKE 1 TABLET (40 MG TOTAL) BY MOUTH DAILY. 90 tablet 3  . clonazePAM (KLONOPIN) 0.5 MG tablet TAKE 1 & 1/2 TABLETS AT BEDTIME 135 tablet 1  . Fluticasone-Salmeterol (ADVAIR) 250-50 MCG/DOSE AEPB Inhale 1 puff into the lungs 2 (two) times daily. 60 each 5  . furosemide (LASIX) 80 MG tablet TAKE 1.5 TABLETS BY MOUTH DAILY. 45 tablet 2  . glimepiride (AMARYL) 4 MG tablet TAKE 1 TABLET BY MOUTH EVERY DAY IN THE MORNING AND 1/2 AT SUPPER 135 tablet 1  . glucose blood (ONE TOUCH ULTRA TEST) test strip Use as instructed to check blood sugar 2 times a day dx code E11.59 100 each 3  . ibuprofen (ADVIL,MOTRIN) 200 MG  tablet Take 800 mg by mouth daily as needed for pain.    . Insulin Disposable Pump (V-GO 20) KIT USE ONE PER DAY 1 kit 5  . insulin lispro (HUMALOG) 100 UNIT/ML injection Use max 56 units per day with V-go pump 60 mL 1  . levothyroxine (SYNTHROID, LEVOTHROID) 125 MCG tablet TAKE 1 TABLET EVERY DAY DAILY BEFORE BREAKFAST 90 tablet 1  . Liraglutide (VICTOZA) 18 MG/3ML SOPN INJECT 1.8 MG ONCE A DAY 27 mL 1  . metFORMIN (GLUCOPHAGE) 1000 MG tablet TAKE 1 TABLET (1,000 MG TOTAL) BY MOUTH 2 (TWO) TIMES DAILY WITH A MEAL. 180 tablet 3  . metoprolol succinate (TOPROL-XL) 50 MG 24 hr tablet Take 1 tablet (50 mg total) by mouth daily. Take with or immediately following a meal. 30 tablet 2  . Multiple Vitamin (MULTIVITAMIN WITH MINERALS) TABS tablet Take 1 tablet by mouth daily. Antioxidant  Multi vitamin    . NOVOTWIST 32G X 5 MM MISC USE ONE PER DAY TO INJECT VICTOZA 100 each 2  . potassium chloride (KLOR-CON M10) 10 MEQ tablet Take 3 tablets (30 mEq total) by mouth daily. 90 tablet 5  . pramipexole (MIRAPEX) 0.5 MG tablet Take 1 tablet (0.5 mg total) by mouth 2 (two) times daily. 60 tablet 5  . spironolactone (ALDACTONE) 25 MG tablet TAKE 1 TABLET (25 MG TOTAL) BY MOUTH DAILY. 30 tablet 3  . traMADol (  ULTRAM) 50 MG tablet Take 1 tablet (50 mg total) by mouth 2 (two) times daily as needed. 60 tablet 2  . travoprost, benzalkonium, (TRAVATAN) 0.004 % ophthalmic solution Place 1 drop into the right eye at bedtime.     . trihexyphenidyl (ARTANE) 2 MG tablet TAKE 1 TABLET TWICE A DAY 180 tablet 1  . vitamin B-12 (CYANOCOBALAMIN) 1000 MCG tablet Take 1,000 mcg by mouth daily.    . nitroGLYCERIN (NITROSTAT) 0.4 MG SL tablet Place 1 tablet (0.4 mg total) under the tongue every 5 (five) minutes as needed for chest pain. 25 tablet 2   No current facility-administered medications for this visit.     Past Medical History  Diagnosis Date  . Cardiomyopathy   . CAD (coronary artery disease)   . HTN (hypertension)      pt denies 08/19/12  . GERD (gastroesophageal reflux disease)   . Hyperplasia, prostate   . Benign neoplasm of colon   . Nephrolithiasis   . Ventral hernia   . HLD (hyperlipidemia)   . Parkinson disease ([place])     1999  . Complication of anesthesia     pt states that he got a rash  . Shortness of breath     Hx: of at all times  . UTI (lower urinary tract infection) 09/15/12    Klebsiella  . Asthma   . DM (diabetes mellitus) ([place])     TYPE 2   . Hyperthyroidism     thyroid lobectomy  . Arthritis     cane  . AICD (automatic cardioverter/defibrillator) present     Dr [name] office visit yearly  . 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 Sleep apnea   . OSA (obstructive sleep apnea)     AHI-28,on CPAP, noncompliant with CPAP  . Sleep apnea, organic   . MI (myocardial infarction) [facility])     Dr [facility] [name] 2000, x3vessels bypass    ROS:   All systems reviewed and negative except as noted in the HPI.   Past Surgical History  Procedure Laterality Date  . Coronary artery bypass graft  2000    Henry Price, MD  . Coronary stent placement  1998  . Median sternotomy  2000  . Deep brain stimulator placement  2004    Right and left VIN stimulator placement (parkinsons)  . 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 Finger amputation      left pointer  . Cataract extraction w/ intraocular lens implant      Hx: of right eye  . Tonsillectomy    . Colonoscopy w/ biopsies and polypectomy      Hx: of  . Cardiac catheterization    . Subthalamic stimulator battery replacement N/A 09/05/2012    Procedure: Deep brain stimulator battery change;  Surgeon: Henry Levine, MD;  [facility]: [name];  Service: Neurosurgery;  Laterality: N/A;  Deep brain stimulator battery change  . Left and right heart catheterization with coronary angiogram N/A 09/24/2013    Procedure: LEFT AND RIGHT HEART CATHETERIZATION WITH CORONARY ANGIOGRAM;  Surgeon: Henry Blanks, MD;  Location: [facility] CATH LAB;  Service: Cardiovascular;  Laterality: N/A;  . Implantable cardioverter defibrillator implant N/A 11/13/2013    Procedure: IMPLANTABLE CARDIOVERTER DEFIBRILLATOR IMPLANT;  Surgeon: Evans Lance, MD;  Location: [facility] CATH LAB;  Service: Cardiovascular;  Laterality: N/A;  . Eye surgery    . Insert / replace / remove pacemaker    .  Colonoscopy N/A 10/13/2014    Procedure: COLONOSCOPY;  Surgeon: Jerene Bears, MD;  Location: [facility] ENDOSCOPY;  Service: Gastroenterology;  Laterality: N/A;     Family History  Problem Relation Age of Onset  . Peripheral vascular disease    . Arthritis    . Aneurysm Mother   . Alcoholism Father   . Autoimmune disease Brother 71    AIDS  . Diabetes Neg Hx   . Heart disease Neg Hx   . Colon cancer Neg Hx   . Prostate cancer Neg Hx      Social History   Social History  . Marital Status: Married    Spouse Name: [name]  . Number of Children: 2  . Years of Education: N/A   Occupational History  . 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 Smoking status: Never Smoker   . Smokeless tobacco: Never Used  . Alcohol Use: Yes     Comment: occasional wine  . Drug Use: No  . Sexual Activity: Not on file   Other Topics Concern  . Not on file   Social History Narrative   From [facility]   Retired/disability Clinical research associate   Likes to fish.     Married 1972   3 kids   [place] fan     BP 126/72 mmHg  Pulse 77  Ht _0  (1.727 m)  Wt 264 lb (119.75 kg)  BMI 40.15 kg/m2  Physical Exam:  Well appearing 65 yo woman, NAD HEENT: Unremarkable Neck:  No JVD, no thyromegally Back:  No CVA tenderness Lungs:  Clear with no wheezes HEART:  Regular rate rhythm, no murmurs, no rubs, no clicks Abd:  soft, positive bowel sounds, no organomegally, no rebound, no guarding Ext:  2 plus pulses, no edema, no cyanosis, no clubbing Skin:  No rashes no nodules Neuro:  CN II through XII intact,  motor grossly intact  ECG - NSR with NSSTT changes.  DEVICE  Normal device function.  See PaceArt for details.   Assess/Plan: 1. Chronic systolic heart failure - his symptoms are well compensated. No change in meds. He is encouraged to maintain a low sodium diet. 2. HTN - his blood pressure is well controlled. 3. ICD - his Medtronic DDD MRI compatible ICD is working normally.

## 2015-04-27 NOTE — Patient Instructions (Signed)
Medication Instructions:  Your physician recommends that you continue on your current medications as directed. Please refer to the Current Medication list given to you today.   Labwork: None ordered   Testing/Procedures: None ordered   Follow-Up: Your physician wants you to follow-up in: 12 months with Dr Knox Saliva will receive a reminder letter in the mail two months in advance. If you don't receive a letter, please call our office to schedule the follow-up appointment.  Remote monitoring is used to monitor your ICD from home. This monitoring reduces the number of office visits required to check your device to one time per year. It allows [place] to keep an eye on the functioning of your device to ensure it is working properly. You are scheduled for a device check from home on 07/27/15. You may send your transmission at any time that day. If you have a wireless device, the transmission will be sent automatically. After your physician reviews your transmission, you will receive a postcard with your next transmission date.     Any Other Special Instructions Will Be Listed Below (If Applicable).     If you need a refill on your cardiac medications before your next appointment, please call your pharmacy.

## 2015-04-29 ENCOUNTER — Encounter: Payer: Self-pay | Admitting: Family Medicine

## 2015-05-17 ENCOUNTER — Other Ambulatory Visit: Payer: Self-pay | Admitting: *Deleted

## 2015-05-17 MED ORDER — FUROSEMIDE 80 MG PO TABS: ORAL_TABLET | ORAL | Status: DC

## 2015-06-08 ENCOUNTER — Telehealth: Payer: Self-pay | Admitting: Neurology

## 2015-06-08 ENCOUNTER — Other Ambulatory Visit: Payer: Self-pay | Admitting: Neurosurgery

## 2015-06-08 ENCOUNTER — Ambulatory Visit (INDEPENDENT_AMBULATORY_CARE_PROVIDER_SITE_OTHER): Payer: Medicare Other | Admitting: Neurology

## 2015-06-08 ENCOUNTER — Encounter: Payer: Self-pay | Admitting: Neurology

## 2015-06-08 VITALS — BP 100/70 | HR 86 | Ht 68.0 in | Wt 259.0 lb

## 2015-06-08 DIAGNOSIS — Z9689 Presence of other specified functional implants: Secondary | ICD-10-CM

## 2015-06-08 DIAGNOSIS — G20 Parkinson's disease: Secondary | ICD-10-CM | POA: Diagnosis not present

## 2015-06-08 DIAGNOSIS — G4752 REM sleep behavior disorder: Secondary | ICD-10-CM | POA: Diagnosis not present

## 2015-06-08 DIAGNOSIS — I255 Ischemic cardiomyopathy: Secondary | ICD-10-CM | POA: Diagnosis not present

## 2015-06-08 MED ORDER — HOME STYLE BED RAILS MISC: 1.0000 | Freq: Every day | Status: DC

## 2015-06-08 NOTE — Progress Notes (Signed)
Henry [name] was seen today in the movement disorders clinic for neurologic consultation at the request of Henry [name].  His PCP is Henry Duncan, MD.  The consultation is for the evaluation of PD and to manage his DBS.  He is accompanied by his wife who supplements the history.  The first symptom(s) the patient noticed was right hand tremor in 1999.  He was seen by neurology and was dx with PD.  He was placed on something, but it caused sleepiness.  He does not think that he has ever been on levodopa.   He was placed on Mirapex, which seemed to help.  He only takes it twice per day.  He didn't think it made a difference when he took it tid.  He began to have tremor and it was suggested he do DBS.    The pt is s/p stn DBS in 2005.  He had a battery change in 2009.    10/02/12 update: The patient is accompanied today by his daughter, who supplements the history.  I reviewed medical records available to me since last visit.  The patient had his generator changed on 09/05/2012.  He remains on pramipexole, 0.5 mg twice per day.  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 [name] in that regard.  He is not compliant with CPAP so says that he doesn't want to return to her for f/u. He doesn't take the klonopin faithfully.  10/11/12 update:  Pt was seen as a walk in/work in today.  Pt had increasing tremor, L greater than R for 3 days.  Thinks that it started with the d/c of artane.  Speech stable.  11/19/12 update:  Pt is seen today for his PD, accompanied by his daughter who supplements the hx.    He is currently on klonopin 0.5 mg - 1/2-1 tablet q hs.  He only takes it when his wife is not working.  She works nights and is only in 2 days per week.  He has some reluctance to take it other nights.  He is on pramipexole 0.5 mg bid.  Last visit, his DBS was reset more similar to the settings he had prior to coming here, just with an increased voltage.  About 2 weeks after our last  visit, the patient decided to go back on the Artane.  The combination of the Artane and the DBS changes helped significantly.  He does ask if I can slightly increased voltage on the left hand, as he has some tremor at night that is bothersome.  Otherwise, he is doing well.  He feels that his balance has been great.  02/19/13 update:  This patient is accompanied in the office by his child who supplements the history.  The pt has a hx of PD.  He has been tremor free and is very happy about that.  He has had some increased balance loss; the last fall was a few months ago but he hasn't gotten hurt.  Was considering hernia surgery.  The records that were made available to me were reviewed.  He is holding on that for now but plans to have it done before end of year.  No hallucinations.  He is still having some acting out of the dreams, despite clonazepam.  06/19/13 update:  Patient is returning to followup regarding his Parkinson's disease.  I had the opportunity to review records since last visit.  He went to the emergency room on 05/12/2013   with chest pain and shortness of breath.  He had been fishing and had missed several doses of Lasix.  He had been eating seafood.  He ended up with an acute exacerbation of his chronic congestive heart failure.  Once he was diuresed and back on his medications, he is feeling better.  He just had a nuclear medicine study done on 06/17/2013 and the ejection fraction on this looked better than on his echocardiogram.  The left ventricular ejection fraction was 41%.  Prior to that, there was some concern that his left ventricular ejection fraction had dropped so much that he may need an ICD.  In terms of Parkinson's disease, the patient states that he has been doing very well in terms of tremor, but his walking really has deteriorated.  He describes a festinating gait, with much more shuffling.  He fell walking over his dog gate but otherwise has not had falls.  He went fishing  yesterday and states that he "stumbled all day long."  No hallucinations.  He has been trying to be more faithful with his CPAP. He remains on Mirapex 0.5 mg twice a day, Artane 2 mg twice a day.  10/23/13 update:  The patient returns today to the clinic, accompanied by his wife who supplements the history.  From a Parkinson's standpoint, the patient states that he has been doing very well.  No tremor.  He started on levodopa last visit and states that he has had no falls and overall stumbling has been much better.  He states that the only time that he stumbles is if he is inside of his fishing boat, and states that that is really because it is small and unstable.  He is taking his levodopa in the morning, after lunch and bedtime.  He has had no hallucinations.  His wife states that he is still draining and acting out the dreams and even fell out of bed a few days ago.  However, he is taking his clonazepam about 10 PM but doesn't go to bed until 1 AM.  He is not using his CPAP.  Is having significant constipation.   I reviewed his cardiology records since last visit.  He did have a heart catheterization and is scheduled to have an ICD placed on November 5.  02/23/14 update:  Pt returns for follow up accompanied by his wife who supplements the history.  The records that were made available to me were reviewed since last visit.  He had an ICD placed on 11/15/13.  He is doing well.  Taking carbidopa/levodopa 25/100 three times a day and remains on artane as well as mirapex 0.5 mg bid.  Had a fall up the stairs.  Wife describes festinating gate and pt thinks that levodopa contributes.  He also doesn't feel good much of the time and thinks that is from levodopa.  Admits, however, that when BS under good control, he feels good.  Has started seeing endocrinology and started on new meds.  He has klonopin for RBD.  Still vivid dreams but no falling out bed (in recliner now).  Having some word finding trouble and memory  doesn't seem clear.  Also, put back tailgate down on car and came down on generator and wants me to look at that.    07/01/14 update:  The patient follows up today regarding his Parkinson's disease.  He is accompanied by his wife who supplements the history.  Last visit, the patient was complaining about memory change which I  thought was likely multifactorial and due to medication such as Artane, Mirapex and Ultram, which he was taking every 3 hours.  The patient, however, thought that it was from levodopa and so we held it and the patient reported that he felt better and therefore continued to stay off of the medication.  He reports that falls are better after d/c levodopa but wife reports still falls.  Pt states that he only fell in shower after he d/c levodopa.  His wife does admit that she thinks that levodopa was causing loss of balance.  He is having more tremor over the last month on the L hand.   He remains on clonazepam 0.5 mg, 1-1/2 tablets at night for REM behavior disorder.  I reviewed records since last visit.  He has seen Dr. Kumar multiple times in regards to his uncontrolled diabetes.  He started on insulin on May 22 2014.  He states that he is doing well with injecting himself.  Having a lot of constipation.  Has the rancho recipe.    11/03/14 update:  The patient is following up today, accompanied by his wife who supplements the history.  Records were reviewed since our last visit.  The patient is on pramipexole 0.5 mg twice a day and Artane 2 mg twice a day.  He has not had any hallucinations.  Had a fall the other day walking up stairs carrying something and fell backward.  Hit his back.  No LOC.  Also fell backward in the summer around the pool.  Fell one time out of his boat.  Doesn't want to use a walker. He was in the emergency room recently with a rash that was felt secondary to insect bites that he got in the woods.  He also recently had a colonoscopy.  His diabetes is under better  control and his last A1c was 7.2.  States that he now has an insulin pump.  He remains on clonazepam 0.5 mg, 1-1/2 tablets at night for REM behavior disorder.  More back pain lately and will start injections for the back which have helped previously  02/04/15 update:  The patient presents today, accompanied by his wife who supplements the history.  I have reviewed prior records made available to me.  He has established a new primary care doctor in Dr. Duncan.  The patient remains on Artane, 2 mg twice a day as well as pramipexole 0.5 mg twice a day.  Overall, he has been doing fairly well.  He occasionally still has some tremor.  He had 2 falls since our last visit; he states that he was pushing a gate open and it came back and hit him.  He was also carrying wood the other day and fell forward with wood.  No hallucinations.  No lightheadedness or near syncope.  He remains on clonazepam 0.5 mg, 1-1/2 tablets at night for REM behavior disorder.  [facility] is [facility] at [facility].    06/08/15 update:  This patient is accompanied in the office by his spouse who supplements the history.  The patient remains on Artane, 2 mg twice a day as well as pramipexole 0.5 mg twice a day.  He tripped over the curb at [employer].  He tripped over the step on his deck.   He remains on clonazepam 0.5 mg, 1-1/2 tablets at night for REM behavior disorder.  He is still having trouble with that.  He fell out of bed the other night.  Wife   asks about bed rails.   He felt that [facility] helped.  Is looking forward to getting in his pool.     PREVIOUS MEDICATIONS: Mirapex and artane  ALLERGIES:   Allergies  Allergen Reactions  . Penicillins Rash    Because of a history of documented adverse serious drug reaction;Medi Alert bracelet  is recommended Has patient had a PCN reaction causing immediate rash, facial/tongue/throat swelling, SOB or lightheadedness with hypotension: Yes Has patient had a PCN reaction  causing severe rash involving mucus membranes or skin necrosis: unknown Has patient had a PCN reaction that required hospitalization NO Has patient had a PCN reaction occurring within the last 10 years: NO If all of the above answers are "N    CURRENT MEDICATIONS:     Medication List       This list is accurate as of: 06/08/15  1:00 PM.  Always use your most recent med list.               acetaminophen 325 MG tablet  Commonly known as:  TYLENOL  Take 1-2 tablets (325-650 mg total) by mouth every 4 (four) hours as needed for mild pain.     albuterol 108 (90 Base) MCG/ACT inhaler  Commonly known as:  PROVENTIL HFA;VENTOLIN HFA  Inhale 2 puffs into the lungs every 6 (six) hours as needed for wheezing or shortness of breath.     aspirin EC 81 MG tablet  Take 81 mg by mouth daily.     atorvastatin 40 MG tablet  Commonly known as:  LIPITOR  TAKE 1 TABLET (40 MG TOTAL) BY MOUTH DAILY.     clonazePAM 0.5 MG tablet  Commonly known as:  KLONOPIN  TAKE 1 & 1/2 TABLETS AT BEDTIME     Fluticasone-Salmeterol 250-50 MCG/DOSE Aepb  Commonly known as:  ADVAIR  Inhale 1 puff into the lungs 2 (two) times daily.     furosemide 80 MG tablet  Commonly known as:  LASIX  TAKE 1.5 TABLETS BY MOUTH DAILY.     glimepiride 4 MG tablet  Commonly known as:  AMARYL  TAKE 1 TABLET BY MOUTH EVERY DAY IN THE MORNING AND 1/2 AT SUPPER     glucose blood test strip  Commonly known as:  ONE TOUCH ULTRA TEST  Use as instructed to check blood sugar 2 times a day dx code E11.59     ibuprofen 200 MG tablet  Commonly known as:  ADVIL,MOTRIN  Take 800 mg by mouth daily as needed for pain.     insulin lispro 100 UNIT/ML injection  Commonly known as:  HUMALOG  Use max 56 units per day with V-go pump     levothyroxine 125 MCG tablet  Commonly known as:  SYNTHROID, LEVOTHROID  TAKE 1 TABLET EVERY DAY DAILY BEFORE BREAKFAST     Liraglutide 18 MG/3ML Sopn  Commonly known as:  VICTOZA  INJECT 1.8 MG  ONCE A DAY     metFORMIN 1000 MG tablet  Commonly known as:  GLUCOPHAGE  TAKE 1 TABLET (1,000 MG TOTAL) BY MOUTH 2 (TWO) TIMES DAILY WITH A MEAL.     metoprolol succinate 50 MG 24 hr tablet  Commonly known as:  TOPROL-XL  Take 1 tablet (50 mg total) by mouth daily. Take with or immediately following a meal.     multivitamin with minerals Tabs tablet  Take 1 tablet by mouth daily. Antioxidant  Multi vitamin     nitroGLYCERIN 0.4 MG SL tablet  Commonly known  as:  NITROSTAT  Place 1 tablet (0.4 mg total) under the tongue every 5 (five) minutes as needed for chest pain.     NOVOTWIST 32G X 5 MM Misc  Generic drug:  Insulin Pen Needle  USE ONE PER DAY TO INJECT VICTOZA     potassium chloride 10 MEQ tablet  Commonly known as:  KLOR-CON M10  Take 3 tablets (30 mEq total) by mouth daily.     pramipexole 0.5 MG tablet  Commonly known as:  MIRAPEX  Take 1 tablet (0.5 mg total) by mouth 2 (two) times daily.     spironolactone 25 MG tablet  Commonly known as:  ALDACTONE  TAKE 1 TABLET (25 MG TOTAL) BY MOUTH DAILY.     traMADol 50 MG tablet  Commonly known as:  ULTRAM  Take 1 tablet (50 mg total) by mouth 2 (two) times daily as needed.     travoprost (benzalkonium) 0.004 % ophthalmic solution  Commonly known as:  TRAVATAN  Place 1 drop into the right eye at bedtime.     trihexyphenidyl 2 MG tablet  Commonly known as:  ARTANE  TAKE 1 TABLET TWICE A DAY     V-GO 20 Kit  USE ONE PER DAY     vitamin B-12 1000 MCG tablet  Commonly known as:  CYANOCOBALAMIN  Take 1,000 mcg by mouth daily.         PAST MEDICAL HISTORY:   Past Medical History  Diagnosis Date  . Cardiomyopathy   . CAD (coronary artery disease)   . HTN (hypertension)     pt denies 08/19/12  . GERD (gastroesophageal reflux disease)   . Hyperplasia, prostate   . Benign neoplasm of colon   . Nephrolithiasis   . Ventral hernia   . HLD (hyperlipidemia)   . Parkinson disease ([place])     1999  . Complication of  anesthesia     pt states that he got a rash  . Shortness of breath     Hx: of at all times  . UTI (lower urinary tract infection) 09/15/12    Klebsiella  . Asthma   . DM (diabetes mellitus) ([place])     TYPE 2   . Hyperthyroidism     thyroid lobectomy  . Arthritis     cane  . AICD (automatic cardioverter/defibrillator) present     Dr Lovena Le office visit yearly  . 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 Sleep apnea   . OSA (obstructive sleep apnea)     AHI-28,on CPAP, noncompliant with CPAP  . Sleep apnea, organic   . MI (myocardial infarction) [facility])     Dr Stanford Breed 2000, x3vessels bypass    PAST SURGICAL HISTORY:   Past Surgical History  Procedure Laterality Date  . Coronary artery bypass graft  2000    [name], MD  . Coronary stent placement  1998  . Median sternotomy  2000  . Deep brain stimulator placement  2004    Right and left VIN stimulator placement (parkinsons)  . 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 Finger amputation      left pointer  . Cataract extraction w/ intraocular lens implant      Hx: of right eye  . Tonsillectomy    . Colonoscopy w/ biopsies and polypectomy      Hx: of  . Cardiac catheterization    . Subthalamic stimulator battery replacement N/A 09/05/2012  Procedure: Deep brain stimulator battery change;  Surgeon: Erline Levine, MD;  Location: [place] [facility];  Service: Neurosurgery;  Laterality: N/A;  Deep brain stimulator battery change  . Left and right heart catheterization with coronary angiogram N/A 09/24/2013    Procedure: LEFT AND RIGHT HEART CATHETERIZATION WITH CORONARY ANGIOGRAM;  Surgeon: Burnell Blanks, MD;  Location: [facility] CATH LAB;  Service: Cardiovascular;  Laterality: N/A;  . Implantable cardioverter defibrillator implant N/A 11/13/2013    Procedure: IMPLANTABLE CARDIOVERTER DEFIBRILLATOR IMPLANT;  Surgeon: Evans Lance, MD;  Location: [facility] CATH LAB;  Service: Cardiovascular;  Laterality:  N/A;  . Eye surgery    . Insert / replace / remove pacemaker    . Colonoscopy N/A 10/13/2014    Procedure: COLONOSCOPY;  Surgeon: Jerene Bears, MD;  Location: [facility] ENDOSCOPY;  Service: Gastroenterology;  Laterality: N/A;    SOCIAL HISTORY:   Social History   Social History  . Marital Status: Married    Spouse Name: [name]  . Number of Children: 2  . Years of Education: N/A   Occupational History  . 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 Smoking status: Never Smoker   . Smokeless tobacco: Never Used  . Alcohol Use: Yes     Comment: occasional wine  . Drug Use: No  . Sexual Activity: Not on file   Other Topics Concern  . Not on file   Social History Narrative   From [facility]   Retired/disability Clinical research associate   Likes to fish.     Married 1972   3 kids   [place] fan    FAMILY HISTORY:   Family Status  Relation Status Death Age  . Mother Deceased     complications of surgery  . Father Deceased     EtOHism  . Brother Alive     1 brother, 1 half brother  . Sister Alive     2 half sisters  . Child Alive     3, alive and well    ROS:  A complete 10 system review of systems was obtained and was unremarkable apart from what is mentioned above.  PHYSICAL EXAMINATION:    VITALS:   Filed Vitals:   06/08/15 1248  BP: 100/70  Pulse: 86  Height: 5' 8" (1.727 m)  Weight: 259 lb (117.482 kg)    GEN:  The patient appears stated age and is in NAD. HEENT:  Normocephalic, atraumatic.  The mucous membranes are moist. The superficial temporal arteries are without ropiness or tenderness. CV:  RRR Lungs:  CTAB Neck/HEME:  There are no carotid bruits bilaterally.   Neurological examination:  Orientation: The patient is alert and oriented x3.  Cranial nerves: There is good facial symmetry.  The visual fields are full to confrontational testing. The speech is fluent and just mildly dysarthric.  He is hypophonic. Soft palate  rises symmetrically and there is no tongue deviation. Hearing is intact to conversational tone. Motor: Strength is 5/5 in the bilateral upper and lower extremities.   Shoulder shrug is equal and symmetric.  There is no pronator drift.  Movement examination: Tone: There is normal tone in the UE and LE bilaterally Abnormal movements: There is no tremor today Coordination:  There is slight decremation with finger taps and alternation of supination/pronation of the forearm b/l Gait and Station: The patient has minimal difficulty arising out of a deep-seated chair without the use of the hands. The patient's stride  length is decreased and he shuffles a little and starts to festinate.    LABS  Lab Results  Component Value Date   WBC 6.9 11/10/2013   HGB 12.8* 11/10/2013   HCT 39.5 11/10/2013   MCV 87.4 11/10/2013   PLT 152.0 11/10/2013   Lab Results  Component Value Date   TSH 2.06 12/15/2014     Chemistry      Component Value Date/Time   NA 140 03/22/2015 1316   K 4.0 03/22/2015 1316   CL 99 03/22/2015 1316   CO2 32 03/22/2015 1316   BUN 14 03/22/2015 1316   CREATININE 1.30 03/22/2015 1316   CREATININE 1.04 06/05/2013 1020      Component Value Date/Time   CALCIUM 9.2 03/22/2015 1316   ALKPHOS 65 12/15/2014 1338   AST 14 12/15/2014 1338   ALT 24 12/15/2014 1338   BILITOT 0.5 12/15/2014 1338     Lab Results  Component Value Date   VITAMINB12 268 08/19/2012   Lab Results  Component Value Date   HGBA1C 6.5 03/22/2015   DBS programming was performed today, which is described in more detail on a separate programming procedural notes. No tremor after programming  ASSESSMENT/PLAN:  1.  Idiopathic Parkinson's disease.    -I again talked to the patient about the Artane, which he is taking 2 mg twice a day.  I expressed to him that this can cause loss of balance and confusion.  However, when we tried to stop it in the past, he was not able to tolerate tremor.    -d/c  pramipexole 0.5 mg bid and change to Mirapex ER 1.5 mg daily   -His DBS battery was last changed on 09/05/2012.  Battery is at end of life.  Called Dr. Stern and will arrange for this to be changed out  -handicap placard filled out 2.  RBD  -He will continue his clonazepam 0.5 mg, to 1-1/2 tablets per night .  Having falling out of bed despite this.  May need to send to sleep specialist. Will write for bed rails 3.  OSAS, noncompliant with CPAP.  -Unfortunately, he has been unable to tolerate CPAP 4.  Sialorrhea  -This is associated with Parkinson's disease.  He may benefit from Myobloc in the future. 5.  Dysphagia.  -Overall, this is fairly mild but I will give an eye on this and consider a MBE if needed. 6.  CHF  -ICD placed on 11/15/2013 7.  Constipation  -copy of rancho recipe given although he still has the problem. 8.  Uncontrolled DM  -Doing much better with insulin pump 9.  Follow-up in the next few months, sooner should new neurologic issues arise.   Much greater than 50% of this visit was spent in counseling and coordinating care.  Total face to face time:  25 min which didn't include DBS time  

## 2015-06-08 NOTE — Procedures (Signed)
DBS Programming was performed.    Total time spent programming was 30 minutes.  Device was confirmed to be on.  Soft start was confirmed to be on.  Impedences were checked and were within normal limits.  Battery was checked and was determined to be near the end of life.  Turned down frequency because of this but opted not to turn down voltage as Dr. Vertell Limber able to change out battery soon per our phone conversation.  Detailed analysis on separate neurophysiologic worksheet.    Final settings were as follows:  Left brain electrode:     1-2+           ; Amplitude  4.0   V   ; Pulse width 90 microseconds;   Frequency   150   Hz.  Right brain electrode:     4-7+          ; Amplitude   4.3  V ;  Pulse width 90  microseconds;  Frequency   150    Hz.

## 2015-06-08 NOTE — Telephone Encounter (Signed)
Referral faxed to [facility] at [phone] with confirmation received. They will contact the patient to schedule.  

## 2015-06-08 NOTE — Patient Instructions (Addendum)
1.  Referral sent to Dr. Vertell Limber and they will call you with an appt. If you do not hear from them let us know. They can be contacted at [phone]. If you don't get a surgery date within the next couple weeks let us know so we can turn down your settings.  2. Stop taking Mirapex 0.5 mg and start taking Mirapex 1.5 ER tablets once daily. This has been sent to your pharmacy. If insurance denies you can take Mirapex 0.5 three times daily.

## 2015-06-09 ENCOUNTER — Encounter (HOSPITAL_COMMUNITY): Payer: Self-pay | Admitting: *Deleted

## 2015-06-09 NOTE — Progress Notes (Signed)
Paged Diabetes Coordinator x 2 (at 4:00 PM and again at 4:30 PM), no return call. I spoke with Dr. Gifford Shave about pt's V-Go insulin pump. He agreed that pt should just keep it on and will evaluate him when he gets here. Instructed me to leave a note to have the Diabetes Coordinator paged on pt's arrival to Short Stay. Pt had been instructed to keep pump on by me during pre-op instructions.

## 2015-06-09 NOTE — Progress Notes (Signed)
Pt is followed by Dr. Cristopher Peru and Dr. Stanford Breed for CAD and has an ICD. Denies any recent chest pain, sob or discharges from his ICD. Pt is diabetic, states he uses a V-Go insulin pump. Pt states he's not able to adjust the basal rate. States his fasting blood sugar is usually around 150. The lowest he remembers it being is 90. Pt instructed to not use his Victoza or oral diabetic medications in the AM. Instructed him to check his blood sugar every 2 hours from the time he wakes up until he leaves for the hospital. If blood sugar is 70 or below, treat with 1/2 cup of clear juice (apple or cranberry) and recheck blood sugar 15 minutes after drinking juice. If blood sugar continues to be 70 or below, call the Short Stay department and ask to speak to a nurse. Pt voiced understanding.

## 2015-06-10 ENCOUNTER — Encounter (HOSPITAL_COMMUNITY): Payer: Self-pay | Admitting: Anesthesiology

## 2015-06-10 ENCOUNTER — Ambulatory Visit (HOSPITAL_COMMUNITY)
Admission: RE | Admit: 2015-06-10 | Discharge: 2015-06-10 | Disposition: A | Discharge status: Home / Self Care | Payer: Medicare Other | Source: Ambulatory Visit | Attending: Neurosurgery | Admitting: Neurosurgery

## 2015-06-10 ENCOUNTER — Encounter (HOSPITAL_COMMUNITY)
Admission: RE | Disposition: A | Discharge status: Home / Self Care | Payer: Self-pay | Source: Ambulatory Visit | Attending: Neurosurgery

## 2015-06-10 ENCOUNTER — Ambulatory Visit (HOSPITAL_COMMUNITY): Payer: Medicare Other | Admitting: Anesthesiology

## 2015-06-10 DIAGNOSIS — Z9641 Presence of insulin pump (external) (internal): Secondary | ICD-10-CM | POA: Diagnosis not present

## 2015-06-10 DIAGNOSIS — G4730 Sleep apnea, unspecified: Secondary | ICD-10-CM | POA: Diagnosis not present

## 2015-06-10 DIAGNOSIS — Z6839 Body mass index (BMI) 39.0-39.9, adult: Secondary | ICD-10-CM | POA: Diagnosis not present

## 2015-06-10 DIAGNOSIS — M1990 Unspecified osteoarthritis, unspecified site: Secondary | ICD-10-CM | POA: Diagnosis not present

## 2015-06-10 DIAGNOSIS — E119 Type 2 diabetes mellitus without complications: Secondary | ICD-10-CM | POA: Diagnosis not present

## 2015-06-10 DIAGNOSIS — Z79899 Other long term (current) drug therapy: Secondary | ICD-10-CM | POA: Diagnosis not present

## 2015-06-10 DIAGNOSIS — G20 Parkinson's disease: Secondary | ICD-10-CM | POA: Insufficient documentation

## 2015-06-10 DIAGNOSIS — Z9119 Patient's noncompliance with other medical treatment and regimen: Secondary | ICD-10-CM | POA: Diagnosis not present

## 2015-06-10 DIAGNOSIS — Z955 Presence of coronary angioplasty implant and graft: Secondary | ICD-10-CM | POA: Insufficient documentation

## 2015-06-10 DIAGNOSIS — I252 Old myocardial infarction: Secondary | ICD-10-CM | POA: Diagnosis not present

## 2015-06-10 DIAGNOSIS — Z951 Presence of aortocoronary bypass graft: Secondary | ICD-10-CM | POA: Diagnosis not present

## 2015-06-10 DIAGNOSIS — T85190A Other mechanical complication of implanted electronic neurostimulator (electrode) of brain, initial encounter: Secondary | ICD-10-CM | POA: Diagnosis not present

## 2015-06-10 DIAGNOSIS — Z7982 Long term (current) use of aspirin: Secondary | ICD-10-CM | POA: Diagnosis not present

## 2015-06-10 DIAGNOSIS — I509 Heart failure, unspecified: Secondary | ICD-10-CM | POA: Diagnosis not present

## 2015-06-10 DIAGNOSIS — I420 Dilated cardiomyopathy: Secondary | ICD-10-CM | POA: Insufficient documentation

## 2015-06-10 DIAGNOSIS — I2510 Atherosclerotic heart disease of native coronary artery without angina pectoris: Secondary | ICD-10-CM | POA: Insufficient documentation

## 2015-06-10 DIAGNOSIS — Z4542 Encounter for adjustment and management of neuropacemaker (brain) (peripheral nerve) (spinal cord): Secondary | ICD-10-CM | POA: Insufficient documentation

## 2015-06-10 DIAGNOSIS — N390 Urinary tract infection, site not specified: Secondary | ICD-10-CM | POA: Diagnosis not present

## 2015-06-10 DIAGNOSIS — Z794 Long term (current) use of insulin: Secondary | ICD-10-CM | POA: Diagnosis not present

## 2015-06-10 DIAGNOSIS — E785 Hyperlipidemia, unspecified: Secondary | ICD-10-CM | POA: Diagnosis not present

## 2015-06-10 DIAGNOSIS — I110 Hypertensive heart disease with heart failure: Secondary | ICD-10-CM | POA: Insufficient documentation

## 2015-06-10 HISTORY — PX: SUBTHALAMIC STIMULATOR BATTERY REPLACEMENT: SHX5405

## 2015-06-10 HISTORY — DX: Restless legs syndrome: G25.81

## 2015-06-10 HISTORY — DX: Nausea with vomiting, unspecified: R11.2

## 2015-06-10 HISTORY — DX: Constipation, unspecified: K59.00

## 2015-06-10 HISTORY — DX: Other specified postprocedural states: Z98.890

## 2015-06-10 HISTORY — DX: Unspecified glaucoma: H40.9

## 2015-06-10 LAB — GLUCOSE, CAPILLARY
GLUCOSE-CAPILLARY: 73 mg/dL (ref 65–99)
GLUCOSE-CAPILLARY: 74 mg/dL (ref 65–99)
GLUCOSE-CAPILLARY: 83 mg/dL (ref 65–99)
Glucose-Capillary: 95 mg/dL (ref 65–99)
Glucose-Capillary: 83 mg/dL (ref 65–99)
Glucose-Capillary: 102 mg/dL — ABNORMAL HIGH (ref 65–99)
Glucose-Capillary: 109 mg/dL — ABNORMAL HIGH (ref 65–99)
Glucose-Capillary: 78 mg/dL (ref 65–99)

## 2015-06-10 LAB — BASIC METABOLIC PANEL
ANION GAP: 12 (ref 5–15)
BUN: 21 mg/dL — ABNORMAL HIGH (ref 6–20)
CALCIUM: 9.4 mg/dL (ref 8.9–10.3)
CO2: 27 mmol/L (ref 22–32)
Chloride: 101 mmol/L (ref 101–111)
Creatinine, Ser: 1.23 mg/dL (ref 0.61–1.24)
GLUCOSE: 81 mg/dL (ref 65–99)
POTASSIUM: 3.6 mmol/L (ref 3.5–5.1)
Sodium: 140 mmol/L (ref 135–145)

## 2015-06-10 LAB — CBC
HEMATOCRIT: 39 % (ref 39.0–52.0)
HEMOGLOBIN: 12.5 g/dL — AB (ref 13.0–17.0)
MCH: 27.4 pg (ref 26.0–34.0)
MCHC: 32.1 g/dL (ref 30.0–36.0)
MCV: 85.5 fL (ref 78.0–100.0)
Platelets: 163 10*3/uL (ref 150–400)
RBC: 4.56 MIL/uL (ref 4.22–5.81)
RDW: 14.2 % (ref 11.5–15.5)
WBC: 6.5 10*3/uL (ref 4.0–10.5)

## 2015-06-10 SURGERY — SUBTHALAMIC STIMULATOR BATTERY REPLACEMENT
Anesthesia: Monitor Anesthesia Care

## 2015-06-10 MED ORDER — PHENYLEPHRINE 40 MCG/ML (10ML) SYRINGE FOR IV PUSH (FOR BLOOD PRESSURE SUPPORT)
PREFILLED_SYRINGE | INTRAVENOUS | Status: DC | PRN
  Administered 2015-06-10 (×2): 80 ug via INTRAVENOUS

## 2015-06-10 MED ORDER — MIDAZOLAM HCL 2 MG/2ML IJ SOLN
INTRAMUSCULAR | Status: AC
  Filled 2015-06-10: qty 2

## 2015-06-10 MED ORDER — FENTANYL CITRATE (PF) 250 MCG/5ML IJ SOLN
INTRAMUSCULAR | Status: AC
  Filled 2015-06-10: qty 5

## 2015-06-10 MED ORDER — FENTANYL CITRATE (PF) 100 MCG/2ML IJ SOLN: 25.0000 ug | INTRAMUSCULAR | Status: DC | PRN

## 2015-06-10 MED ORDER — LIDOCAINE 2% (20 MG/ML) 5 ML SYRINGE
INTRAMUSCULAR | Status: DC | PRN
  Administered 2015-06-10: 40 mg via INTRAVENOUS

## 2015-06-10 MED ORDER — 0.9 % SODIUM CHLORIDE (POUR BTL) OPTIME
TOPICAL | Status: DC | PRN
Start: 2015-06-10 — End: 2015-06-10
  Administered 2015-06-10: 1000 mL

## 2015-06-10 MED ORDER — PHENYLEPHRINE 40 MCG/ML (10ML) SYRINGE FOR IV PUSH (FOR BLOOD PRESSURE SUPPORT)
PREFILLED_SYRINGE | INTRAVENOUS | Status: AC
  Filled 2015-06-10: qty 30

## 2015-06-10 MED ORDER — MIDAZOLAM HCL 5 MG/5ML IJ SOLN
INTRAMUSCULAR | Status: DC | PRN
  Administered 2015-06-10 (×2): 1 mg via INTRAVENOUS

## 2015-06-10 MED ORDER — EPHEDRINE 5 MG/ML INJ
INTRAVENOUS | Status: AC
  Filled 2015-06-10: qty 20

## 2015-06-10 MED ORDER — FENTANYL CITRATE (PF) 100 MCG/2ML IJ SOLN
INTRAMUSCULAR | Status: DC | PRN
  Administered 2015-06-10: 50 ug via INTRAVENOUS
  Administered 2015-06-10: 25 ug via INTRAVENOUS

## 2015-06-10 MED ORDER — VANCOMYCIN HCL 1000 MG IV SOLR
1000.0000 mg | INTRAVENOUS | Status: DC | PRN
  Administered 2015-06-10: 1000 mg via INTRAVENOUS

## 2015-06-10 MED ORDER — VANCOMYCIN HCL 1000 MG IV SOLR
INTRAVENOUS | Status: AC
  Filled 2015-06-10: qty 1000

## 2015-06-10 MED ORDER — DEXTROSE IN LACTATED RINGERS 5 % IV SOLN
INTRAVENOUS | Status: DC
  Administered 2015-06-10 (×2): via INTRAVENOUS

## 2015-06-10 MED ORDER — SODIUM CHLORIDE 0.9 % IJ SOLN
INTRAMUSCULAR | Status: AC
  Filled 2015-06-10: qty 40

## 2015-06-10 MED ORDER — VECURONIUM BROMIDE 10 MG IV SOLR
INTRAVENOUS | Status: AC
  Filled 2015-06-10: qty 40

## 2015-06-10 MED ORDER — PROPOFOL 500 MG/50ML IV EMUL
INTRAVENOUS | Status: DC | PRN
  Administered 2015-06-10: 75 ug/kg/min via INTRAVENOUS

## 2015-06-10 MED ORDER — LIDOCAINE-EPINEPHRINE 1 %-1:100000 IJ SOLN
INTRAMUSCULAR | Status: DC | PRN
  Administered 2015-06-10: 10 mL

## 2015-06-10 MED ORDER — BUPIVACAINE HCL (PF) 0.5 % IJ SOLN
INTRAMUSCULAR | Status: DC | PRN
  Administered 2015-06-10: 10 mL

## 2015-06-10 MED ORDER — LACTATED RINGERS IV SOLN
INTRAVENOUS | Status: DC
  Administered 2015-06-10: 11:00:00 via INTRAVENOUS

## 2015-06-10 MED ORDER — LIDOCAINE 2% (20 MG/ML) 5 ML SYRINGE
INTRAMUSCULAR | Status: AC
  Filled 2015-06-10: qty 15

## 2015-06-10 MED ORDER — VANCOMYCIN HCL 1000 MG IV SOLR
INTRAVENOUS | Status: DC | PRN
  Administered 2015-06-10: 1000 mg

## 2015-06-10 MED ORDER — ONDANSETRON HCL 4 MG/2ML IJ SOLN: 4.0000 mg | Freq: Once | INTRAMUSCULAR | Status: DC | PRN

## 2015-06-10 MED ORDER — TRAMADOL HCL 50 MG PO TABS: 50.0000 mg | ORAL_TABLET | Freq: Four times a day (QID) | ORAL | Status: DC | PRN

## 2015-06-10 MED ORDER — DEXTROSE 5 % IV SOLN
10.0000 mg | INTRAVENOUS | Status: DC | PRN
  Administered 2015-06-10: 25 ug/min via INTRAVENOUS

## 2015-06-10 SURGICAL SUPPLY — 45 items
ADH SKN CLS APL DERMABOND .7 (GAUZE/BANDAGES/DRESSINGS) ×1
CANISTER SUCT 3000ML PPV (MISCELLANEOUS) ×2 IMPLANT
DECANTER SPIKE VIAL GLASS SM (MISCELLANEOUS) ×2 IMPLANT
DERMABOND ADVANCED (GAUZE/BANDAGES/DRESSINGS) ×1
DERMABOND ADVANCED .7 DNX12 (GAUZE/BANDAGES/DRESSINGS) ×1 IMPLANT
DRAPE CAMERA VIDEO/LASER (DRAPES) ×1 IMPLANT
DRAPE LAPAROTOMY 100X72 PEDS (DRAPES) ×2 IMPLANT
DRAPE POUCH INSTRU U-SHP 10X18 (DRAPES) ×2 IMPLANT
DRSG OPSITE POSTOP 4X6 (GAUZE/BANDAGES/DRESSINGS) ×1 IMPLANT
DURAPREP 26ML APPLICATOR (WOUND CARE) ×2 IMPLANT
GAUZE SPONGE 4X4 16PLY XRAY LF (GAUZE/BANDAGES/DRESSINGS) IMPLANT
GLOVE BIO SURGEON STRL SZ8 (GLOVE) ×2 IMPLANT
GLOVE BIOGEL PI IND STRL 6.5 (GLOVE) IMPLANT
GLOVE BIOGEL PI IND STRL 8 (GLOVE) ×1 IMPLANT
GLOVE BIOGEL PI IND STRL 8.5 (GLOVE) ×1 IMPLANT
GLOVE BIOGEL PI INDICATOR 6.5 (GLOVE) ×3
GLOVE BIOGEL PI INDICATOR 8 (GLOVE) ×1
GLOVE BIOGEL PI INDICATOR 8.5 (GLOVE) ×1
GLOVE ECLIPSE 7.5 STRL STRAW (GLOVE) ×1 IMPLANT
GLOVE ECLIPSE 8.0 STRL XLNG CF (GLOVE) ×2 IMPLANT
GLOVE EXAM NITRILE LRG STRL (GLOVE) IMPLANT
GLOVE EXAM NITRILE MD LF STRL (GLOVE) IMPLANT
GLOVE EXAM NITRILE XL STR (GLOVE) IMPLANT
GLOVE EXAM NITRILE XS STR PU (GLOVE) IMPLANT
GLOVE INDICATOR 7.0 STRL GRN (GLOVE) ×1 IMPLANT
GLOVE INDICATOR 7.5 STRL GRN (GLOVE) ×1 IMPLANT
GLOVE INDICATOR 8.0 STRL GRN (GLOVE) ×1 IMPLANT
GOWN STRL REUS W/ TWL LRG LVL3 (GOWN DISPOSABLE) IMPLANT
GOWN STRL REUS W/ TWL XL LVL3 (GOWN DISPOSABLE) ×1 IMPLANT
GOWN STRL REUS W/TWL 2XL LVL3 (GOWN DISPOSABLE) ×3 IMPLANT
GOWN STRL REUS W/TWL LRG LVL3 (GOWN DISPOSABLE)
GOWN STRL REUS W/TWL XL LVL3 (GOWN DISPOSABLE) ×4
KIT BASIN OR (CUSTOM PROCEDURE TRAY) ×2 IMPLANT
KIT ROOM TURNOVER OR (KITS) ×2 IMPLANT
NDL HYPO 25X1 1.5 SAFETY (NEEDLE) ×1 IMPLANT
NEEDLE HYPO 25X1 1.5 SAFETY (NEEDLE) ×2 IMPLANT
NEUROSTIM OCTOPOLAR ~~LOC~~ 49X65 (Neuro Prosthesis/Implant) ×1 IMPLANT
NS IRRIG 1000ML POUR BTL (IV SOLUTION) ×2 IMPLANT
PACK LAMINECTOMY NEURO (CUSTOM PROCEDURE TRAY) ×2 IMPLANT
PAD ARMBOARD 7.5X6 YLW CONV (MISCELLANEOUS) ×6 IMPLANT
SUT VIC AB 2-0 CP2 18 (SUTURE) ×2 IMPLANT
SUT VIC AB 3-0 SH 8-18 (SUTURE) ×2 IMPLANT
TOWEL OR 17X24 6PK STRL BLUE (TOWEL DISPOSABLE) ×2 IMPLANT
TOWEL OR 17X26 10 PK STRL BLUE (TOWEL DISPOSABLE) ×2 IMPLANT
WATER STERILE IRR 1000ML POUR (IV SOLUTION) ×2 IMPLANT

## 2015-06-10 NOTE — Progress Notes (Signed)
This nurse called Henry [name], [employer] rep, an hour prior to OR start time, as requested by her.

## 2015-06-10 NOTE — H&P (Signed)
>   [place], [address] DM:[id number] Phone: [phone]   Patient ID:   [id number] Patient: Henry [name]  Date of Birth: 12-Nov-1950 Visit Type: Office Visit   Date: 09/16/2012 03:30 PM Provider: Marchia Meiers. Vertell Limber   Historian: self  This 65 year old male presents for Parkinson's Disease.  HISTORY OF PRESENT ILLNESS: 1.  Parkinson's Disease   Post op f/u and suture removal.  Patient denies any pain and has not had to take in medication.   Dermabond present. No erythema, heat, or drainage. Mildly puffy over IPG as expected - decreased greatly per pt.   Cipro for UTI x2days so far.  Wound looks good.  No erythema.  Patient will continue Cipro for urinary tract infection.   Medical/Surgical/Interim History  Reviewed, no change.  Last detailed document date:07/31/2012.    Family History: Reviewed, no changes.  Last detailed document date:07/31/2012.  Patient reports there is no relevant family history.   Social History: Reviewed, no changes. Last detailed document date: 07/31/2012.      Medications (added, continued or stopped this visit):   Medication Dose Prescribed Else Ind Started Stopped  Amaryl 2 mg tablet 2 mg Y    Aspir-81 81 mg tablet,delayed release 81 mg Y    Crestor 20 mg tablet 20 mg Y    Janumet XR 50 mg-1,000 mg tablet,extended release 50 mg-1,000 mg Y    klor-con M20 2mEq ORAL TABLET 25mEq Y    Lasix 80 mg tablet 80 mg Y    methimazole 10 mg tablet 10 mg Y    Mirapex 0.5 mg tablet 0.5 mg Y    tramadol 50 mg tablet 50 mg Y    trihexyphenidyl 2 mg tablet 2 mg Y       Allergies:  Ingredient Reaction Medication Name Comment  PENICILLINS     Reviewed, no changes.   Vitals Date Temp F BP Pulse Ht In Wt Lb BMI BSA Pain Score  09/16/2012  130/72 74 68 290 44.09  0/10        IMPRESSION Doing well following IPG change, on ciprofloxacin for urinary tract infection.  Assessment/Plan # Detail Type Description   1.  Assessment Parkinson disease (332.0).        Followup for reprogramming of DBS with [employer] representative at the end of the month.             Provider:  Marchia Meiers. Vertell Limber  09/17/2012 06:30 PM Dictation edited by: Marchia Meiers. Vertell Limber    CC Providers: [facility] [place], [place] [phone]- ----------------------------------------------------------------------------------------------------------------------------------------------------------------------         Electronically signed by Marchia Meiers. 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  H&P otherwise unchanged.

## 2015-06-10 NOTE — Op Note (Signed)
06/10/2015  3:01 PM  PATIENT:  Henry [name]  65 y.o. 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  POST-OPERATIVE DIAGNOSIS:  Parkinson's Disease with DBS and depleted IPG   PROCEDURE:  Procedure(s): Deep Brain stimulator battery change (N/A)  SURGEON:  Surgeon(s) and Role:    * Erline Levine, MD - Primary  PHYSICIAN ASSISTANT:   ASSISTANTS: Poteat, RN   ANESTHESIA:   IV sedation  EBL:  Total I/O In: 750 [I.V.:500; IV Piggyback:250] Out: 370 [Urine:350; Blood:20]  BLOOD ADMINISTERED:none  DRAINS: none   LOCAL MEDICATIONS USED:  LIDOCAINE   SPECIMEN:  No Specimen  DISPOSITION OF SPECIMEN:  N/A  COUNTS:  YES  TOURNIQUET:  * No tourniquets in log *  DICTATION: DICTATION: Patient has implanted subthalamic stimulator electrodes and IPG, which is now depleted.  It was elected for patient to undergo IPG revision.  PROCEDURE: Patient was brought to the operating room and given intravenous sedation.  Right upper chest was prepped with betadine scrub and paint.  Area of planned incision was infiltrated with lidocaine.  Prior incision was reopened and the old IPG was externalized.  Adaptor was connected to new IPG which was placed in the pocket.  Wound was irrigated with vancomycin. Then irrigated once more. Impedances were checked and found to be OK. Incision was closed with 2-0 Vicryl and 3-0 vicryl sutures and dressed with a sterile occlusive dressing.  Counts were correct at the end of the case.  PLAN OF CARE: Discharge to home after PACU  PATIENT DISPOSITION:  PACU - hemodynamically stable.   Delay start of Pharmacological VTE agent (>24hrs) due to surgical blood loss or risk of bleeding: yes

## 2015-06-10 NOTE — Interval H&P Note (Signed)
History and Physical Interval Note:  06/10/2015 7:19 AM  Henry [name]  has presented today for surgery, with the diagnosis of Parkinson's Disease  The various methods of treatment have been discussed with the patient and family. After consideration of risks, benefits and other options for treatment, the patient has consented to  Procedure(s) with comments: Deep Brain stimulator battery change (N/A) - Deep Brain stimulator battery change as a surgical intervention .  The patient's history has been reviewed, patient examined, no change in status, stable for surgery.  I have reviewed the patient's chart and labs.  Questions were answered to the patient's satisfaction.     Kolson Chovanec D

## 2015-06-10 NOTE — Progress Notes (Signed)
This nurse called and left messages on Henry [name] and [name]'s cell phone that pt had been transported to [facility] at 1235.

## 2015-06-10 NOTE — Anesthesia Preprocedure Evaluation (Addendum)
Anesthesia Evaluation  Patient identified by MRN, date of birth, ID band Patient awake    Reviewed: Allergy & Precautions, NPO status , Patient's Chart, lab work & pertinent test results  History of Anesthesia Complications (+) PONV and history of anesthetic complications  Airway Mallampati: III  TM Distance: >3 FB Neck ROM: Full    Dental no notable dental hx. (+) Teeth Intact, Dental Advisory Given   Pulmonary shortness of breath and at rest, asthma , sleep apnea and Continuous Positive Airway Pressure Ventilation ,  Non compliant with CPAP   Pulmonary exam normal breath sounds clear to auscultation       Cardiovascular hypertension, Pt. on medications + CAD, + Past MI, + Cardiac Stents, + Peripheral Vascular Disease and +CHF  Normal cardiovascular exam+ Cardiac Defibrillator  Rhythm:Regular Rate:Normal  Dilated Cardiomyopathy LVEF 30-35% NYHA Class III Chronic CHF Akinesis and scarring of the mid-apicalanteroseptal myocardium MI 1998 PTCA w/ stent CABG x 3 2000   Neuro/Psych Parkinson's Disease S/P Deep brain stimulator End of life battery for Deep Brain Stimulator Restless legs syndrome Hearing Deficit S/P Acoustic Neuroma resection  Neuromuscular disease negative psych ROS   GI/Hepatic Neg liver ROS, GERD  Medicated and Controlled,  Endo/Other  diabetes, Well Controlled, Type 2, Insulin DependentHypothyroidism Hyperthyroidism Morbid obesityHyperlipidemia  Renal/GU Renal InsufficiencyRenal diseaseHx/o renal calculi   BPH    Musculoskeletal  (+) Arthritis ,   Abdominal (+) + obese,   Peds  Hematology negative hematology ROS (+)   Anesthesia Other Findings   Reproductive/Obstetrics                      Anesthesia Physical Anesthesia Plan  ASA: III  Anesthesia Plan: MAC   Post-op Pain Management:    Induction: Intravenous  Airway Management Planned: Natural Airway and Nasal  Cannula  Additional Equipment:   Intra-op Plan:   Post-operative Plan:   Informed Consent: I have reviewed the patients History and Physical, chart, labs and discussed the procedure including the risks, benefits and alternatives for the proposed anesthesia with the patient or authorized representative who has indicated his/her understanding and acceptance.   Dental advisory given  Plan Discussed with: CRNA, Anesthesiologist and Surgeon  Anesthesia Plan Comments: (Will get AICD rep to interrogate and/or turn off AICD during procedure. Will check BG intraop as patient took all of his oral agents for DM and has insulin pump running. He has been started on D5LR at 63ml/hr.)      Anesthesia Quick Evaluation

## 2015-06-10 NOTE — Progress Notes (Signed)
This nurse called to inform Dr. Royce Macadamia that 1100 CBG=74. D5LR at 58ml/hour and every 30 minute CBGs ordered. Pt denies hypoglycemic symptoms at present, will monitor.

## 2015-06-10 NOTE — Progress Notes (Signed)
Spoke with Sonia Baller, Diabetes Coordinator, regarding insulin pump, pt being unable to decrease basal rate and surgery. Sonia Baller recommended leaving insulin pump infusing, checking CBG hourly and adding dextrose IV fluid if needed. This nurse also called Dr. Royce Macadamia and informed him of her recommendations as well as the fact that pt has a [employer] defibrillator. Tomi Bamberger, [employer] rep called by this nurse, rep will plan to be present during surgery. Emergency orders placed on chart. Dr. Royce Macadamia aware.

## 2015-06-10 NOTE — Progress Notes (Signed)
Inpatient Diabetes Program Recommendations  AACE/ADA: New Consensus Statement on Inpatient Glycemic Control (2015)  Target Ranges:  Prepandial:   less than 140 mg/dL      Peak postprandial:   less than 180 mg/dL (1-2 hours)      Critically ill patients:  140 - 180 mg/dL  Results for Henry [name], APOSTLE (MRN [id number]) as of 06/10/2015 09:51  Ref. Range 03/22/2015 13:16  Hemoglobin A1C Latest Ref Range: 4.6-6.5 % 6.5   Review of Glycemic Control:   CBG=152 mg/dL this morning  Diabetes history:  Outpatient Diabetes medications: Amaryl 4 mg daily, V-G 20 disposable insulin pump which delivers 20 units of basal insulin over 24 hours Current orders for Inpatient glycemic control:  V-Go 20  Inpatient Diabetes Program Recommendations:    Note that patient having procedure today.  RN in short stay called stating that patient is unable to reduce basal rates due to V-GO pump/disposable.  Current blood sugar is 152 mg/dL.  Recommended checking blood sugars hourly during procedure and continue V-Go.   Thanks, Adah Perl, RN, BC-ADM Inpatient Diabetes Coordinator Pager [phone] (8a-5p)

## 2015-06-10 NOTE — Brief Op Note (Signed)
06/10/2015  3:01 PM  PATIENT:  Henry [name]  65 y.o. 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  POST-OPERATIVE DIAGNOSIS:  Parkinson's Disease with DBS and depleted IPG   PROCEDURE:  Procedure(s): Deep Brain stimulator battery change (N/A)  SURGEON:  Surgeon(s) and Role:    * Erline Levine, MD - Primary  PHYSICIAN ASSISTANT:   ASSISTANTS: Poteat, RN   ANESTHESIA:   IV sedation  EBL:  Total I/O In: 750 [I.V.:500; IV Piggyback:250] Out: 370 [Urine:350; Blood:20]  BLOOD ADMINISTERED:none  DRAINS: none   LOCAL MEDICATIONS USED:  LIDOCAINE   SPECIMEN:  No Specimen  DISPOSITION OF SPECIMEN:  N/A  COUNTS:  YES  TOURNIQUET:  * No tourniquets in log *  DICTATION: DICTATION: Patient has implanted subthalamic stimulator electrodes and IPG, which is now depleted.  It was elected for patient to undergo IPG revision.  PROCEDURE: Patient was brought to the operating room and given intravenous sedation.  Right upper chest was prepped with betadine scrub and paint.  Area of planned incision was infiltrated with lidocaine.  Prior incision was reopened and the old IPG was externalized.  Adaptor was connected to new IPG which was placed in the pocket.  Wound was irrigated with vancomycin. Then irrigated once more. Impedances were checked and found to be OK. Incision was closed with 2-0 Vicryl and 3-0 vicryl sutures and dressed with a sterile occlusive dressing.  Counts were correct at the end of the case.  PLAN OF CARE: Discharge to home after PACU  PATIENT DISPOSITION:  PACU - hemodynamically stable.   Delay start of Pharmacological VTE agent (>24hrs) due to surgical blood loss or risk of bleeding: yes

## 2015-06-10 NOTE — Transfer of Care (Signed)
Immediate Anesthesia Transfer of Care Note  Patient: Henry [name]  Procedure(s) Performed: Procedure(s): Deep Brain stimulator battery change (N/A)  Patient Location: PACU  Anesthesia Type:MAC  Level of Consciousness: awake, alert , oriented and patient cooperative  Airway & Oxygen Therapy: Patient Spontanous Breathing and Patient connected to nasal cannula oxygen  Post-op Assessment: Report given to RN and Post -op Vital signs reviewed and stable  Post vital signs: Reviewed and stable  Last Vitals:  Filed Vitals:   06/10/15 0955  BP: 126/79  Pulse: 74  Temp: 36.6 C  Resp: 20    Last Pain: There were no vitals filed for this visit.    Patients Stated Pain Goal: 3 ([id number])  Complications: No apparent anesthesia complications

## 2015-06-10 NOTE — Anesthesia Postprocedure Evaluation (Addendum)
Anesthesia Post Note  Patient: Henry [name]  Procedure(s) Performed: Procedure(s) (LRB): Deep Brain stimulator battery change (N/A)  Patient location during evaluation: PACU Anesthesia Type: MAC Level of consciousness: awake and alert and oriented Pain management: pain level controlled Vital Signs Assessment: post-procedure vital signs reviewed and stable Respiratory status: spontaneous breathing, nonlabored ventilation and respiratory function stable Cardiovascular status: blood pressure returned to baseline and stable Postop Assessment: no signs of nausea or vomiting Anesthetic complications: no    Last Vitals:  Filed Vitals:   06/10/15 1459 06/10/15 1500  BP: 118/82   Pulse: 73 76  Temp:    Resp: 12 19    Last Pain: There were no vitals filed for this visit.               Magdaleno Lortie A.

## 2015-06-11 ENCOUNTER — Encounter (HOSPITAL_COMMUNITY): Payer: Self-pay | Admitting: Neurosurgery

## 2015-06-21 ENCOUNTER — Other Ambulatory Visit (INDEPENDENT_AMBULATORY_CARE_PROVIDER_SITE_OTHER): Payer: Medicare Other

## 2015-06-21 DIAGNOSIS — E1165 Type 2 diabetes mellitus with hyperglycemia: Secondary | ICD-10-CM

## 2015-06-21 DIAGNOSIS — Z794 Long term (current) use of insulin: Secondary | ICD-10-CM

## 2015-06-21 LAB — HEMOGLOBIN A1C: Hgb A1c MFr Bld: 6.2 % (ref 4.6–6.5)

## 2015-06-21 LAB — COMPREHENSIVE METABOLIC PANEL
ALBUMIN: 4.1 g/dL (ref 3.5–5.2)
ALK PHOS: 68 U/L (ref 39–117)
ALT: 19 U/L (ref 0–53)
AST: 12 U/L (ref 0–37)
BUN: 23 mg/dL (ref 6–23)
CO2: 31 mEq/L (ref 19–32)
CREATININE: 1.27 mg/dL (ref 0.40–1.50)
Calcium: 9.2 mg/dL (ref 8.4–10.5)
Chloride: 100 mEq/L (ref 96–112)
GFR: 60.7 mL/min (ref >60.00)
GLUCOSE: 130 mg/dL — AB (ref 70–99)
Potassium: 4 mEq/L (ref 3.5–5.1)
SODIUM: 139 meq/L (ref 135–145)
TOTAL PROTEIN: 6.5 g/dL (ref 6.0–8.3)
Total Bilirubin: 0.3 mg/dL (ref 0.2–1.2)

## 2015-06-24 ENCOUNTER — Encounter: Payer: Self-pay | Admitting: Endocrinology

## 2015-06-24 ENCOUNTER — Ambulatory Visit (INDEPENDENT_AMBULATORY_CARE_PROVIDER_SITE_OTHER): Payer: Medicare Other | Admitting: Endocrinology

## 2015-06-24 VITALS — BP 132/84 | HR 80 | Wt 262.1 lb

## 2015-06-24 DIAGNOSIS — E1165 Type 2 diabetes mellitus with hyperglycemia: Secondary | ICD-10-CM | POA: Diagnosis not present

## 2015-06-24 DIAGNOSIS — E890 Postprocedural hypothyroidism: Secondary | ICD-10-CM

## 2015-06-24 DIAGNOSIS — E669 Obesity, unspecified: Secondary | ICD-10-CM

## 2015-06-24 DIAGNOSIS — Z794 Long term (current) use of insulin: Secondary | ICD-10-CM

## 2015-06-24 NOTE — Patient Instructions (Signed)
Less fried food  1 click for bedtime snack, reduce cookies

## 2015-06-24 NOTE — Progress Notes (Signed)
Patient ID: Henry [name], male   DOB: 26-May-1950, 65 y.o.   MRN: [id number]           Reason for Appointment: Follow-up for Type 2 Diabetes   History of Present Illness:          Diagnosis: Type 2 diabetes mellitus, date of diagnosis: 2000      Past history:   Patient thinks he has been taking Amaryl for several years and probably metformin since onset also At some point he was changed from metformin to [facility] and was taking this since at least 2012 A1c had been higher in 2015 Since his A1c had been progressively higher with his regimen of Janumet and Amaryl he was started on Victoza in 01/2014 He was started on Lantus insulin in 5/16 because of persistent hyperglycemia especially fasting; was having readings as high as 293  Recent history:   INSULIN regimen: V-go-20 pump and boluses 4-8 units  Non-insulin hypoglycemic drugs the patient is taking are: Metformin 1 g twice a day, Amaryl 4 mg a.m., 2 mg p.m., Victoza 1.8 mg daily    Because of tendency to high postprandial readings and for control with Lantus he was switched to the V-go pump in  09/2014  Continues to use the pump for boluses at meals but usually taking small amounts  His A1c is better with this appears to be much lower than expected for his home blood sugars  Current management, blood sugar patterns and problems identified:  He is having overall high readings although inconsistent  His wife thinks that he is still eating late at night which will sometimes causes sugars to be higher in the morning  Also will go off his diet periodically with readings as high as 400 sporadically  Again not checking blood sugars after supper much, usually around suppertime which is variable, occasional evening readings may be after eating but he does not marked them  He is tending to gain weight despite taking Victoza and metformin  He is not making good choices for his meals and eating more fried food  He has  seen the  dietitian in 3/16 Not able to exercise much       Side effects from medications have been: None Compliance with the medical regimen: Fair  Hypoglycemia:   none  Glucose monitoring:  done 1-2 times  a day         Glucometer: One Touch.      Blood Glucose readings by   Mean values apply above for all meters except median for One Touch  PRE-MEAL Fasting Lunch 7-10 PM  Bedtime Overall  Glucose range: 110-292  ?   97-407     Mean/median: 180  160   177    Self-care: The diet that the patient has been following is:  tries to eat more vegetables, more fried.        Meals: 9am , 2-3 pm and 7 pm    Exercise: unable to do any         Dietician visit, most recent: 03/2014                Weight history: Previous range: 266-277  Wt Readings from Last 3 Encounters:  06/24/15 262 lb 2 oz (118.899 kg)  06/10/15 259 lb (117.482 kg)  06/08/15 259 lb (117.482 kg)    Glycemic control:   Lab Results  Component Value Date   HGBA1C 6.2 06/21/2015   HGBA1C 6.5 03/22/2015   HGBA1C  7.0* 12/15/2014   Lab Results  Component Value Date   MICROALBUR <0.7 12/15/2014   LDLCALC 126* 04/08/2013   CREATININE 1.27 06/21/2015    Lab on 06/21/2015  Component Date Value Ref Range Status  . Hgb A1c MFr Bld 06/21/2015 6.2  4.6 - 6.5 % Final   Glycemic Control Guidelines for People with Diabetes:Non Diabetic:  <6%Goal of Therapy: <7%Additional Action Suggested:  >8%   . Sodium 06/21/2015 139  135 - 145 mEq/L Final  . Potassium 06/21/2015 4.0  3.5 - 5.1 mEq/L Final  . Chloride 06/21/2015 100  96 - 112 mEq/L Final  . CO2 06/21/2015 31  19 - 32 mEq/L Final  . Glucose, Bld 06/21/2015 130* 70 - 99 mg/dL Final  . BUN 06/21/2015 23  6 - 23 mg/dL Final  . Creatinine, Ser 06/21/2015 1.27  0.40 - 1.50 mg/dL Final  . Total Bilirubin 06/21/2015 0.3  0.2 - 1.2 mg/dL Final  . Alkaline Phosphatase 06/21/2015 68  39 - 117 U/L Final  . AST 06/21/2015 12  0 - 37 U/L Final  . ALT 06/21/2015 19  0 - 53 U/L Final  .  Total Protein 06/21/2015 6.5  6.0 - 8.3 g/dL Final  . Albumin 06/21/2015 4.1  3.5 - 5.2 g/dL Final  . Calcium 06/21/2015 9.2  8.4 - 10.5 mg/dL Final  . GFR 06/21/2015 60.70  >60.00 mL/min Final        Medication List       This list is accurate as of: 06/24/15 11:59 PM.  Always use your most recent med list.               acetaminophen 325 MG tablet  Commonly known as:  TYLENOL  Take 1-2 tablets (325-650 mg total) by mouth every 4 (four) hours as needed for mild pain.     albuterol 108 (90 Base) MCG/ACT inhaler  Commonly known as:  PROVENTIL HFA;VENTOLIN HFA  Inhale 2 puffs into the lungs every 6 (six) hours as needed for wheezing or shortness of breath.     aspirin EC 81 MG tablet  Take 81 mg by mouth daily.     atorvastatin 40 MG tablet  Commonly known as:  LIPITOR  TAKE 1 TABLET (40 MG TOTAL) BY MOUTH DAILY.     clonazePAM 0.5 MG tablet  Commonly known as:  KLONOPIN  TAKE 1 & 1/2 TABLETS AT BEDTIME     Fluticasone-Salmeterol 250-50 MCG/DOSE Aepb  Commonly known as:  ADVAIR  Inhale 1 puff into the lungs 2 (two) times daily.     furosemide 80 MG tablet  Commonly known as:  LASIX  TAKE 1.5 TABLETS BY MOUTH DAILY.     glimepiride 4 MG tablet  Commonly known as:  AMARYL  TAKE 1 TABLET BY MOUTH EVERY DAY IN THE MORNING AND 1/2 AT SUPPER     glucose blood test strip  Commonly known as:  ONE TOUCH ULTRA TEST  Use as instructed to check blood sugar 2 times a day dx code E11.59     Home Style Bed Rails Misc  1 Device by Does not apply route daily.     ibuprofen 200 MG tablet  Commonly known as:  ADVIL,MOTRIN  Take 800 mg by mouth every 6 (six) hours as needed (For pain.).     insulin lispro 100 UNIT/ML injection  Commonly known as:  HUMALOG  Use max 56 units per day with V-go pump     levothyroxine 125 MCG tablet  Commonly known as:  SYNTHROID, LEVOTHROID  TAKE 1 TABLET EVERY DAY DAILY BEFORE BREAKFAST     Liraglutide 18 MG/3ML Sopn  Commonly known as:   VICTOZA  INJECT 1.8 MG ONCE A DAY     metFORMIN 1000 MG tablet  Commonly known as:  GLUCOPHAGE  TAKE 1 TABLET (1,000 MG TOTAL) BY MOUTH 2 (TWO) TIMES DAILY WITH A MEAL.     metoprolol succinate 50 MG 24 hr tablet  Commonly known as:  TOPROL-XL  Take 1 tablet (50 mg total) by mouth daily. Take with or immediately following a meal.     multivitamin with minerals Tabs tablet  Take 1 tablet by mouth daily.     nitroGLYCERIN 0.4 MG SL tablet  Commonly known as:  NITROSTAT  Place 1 tablet (0.4 mg total) under the tongue every 5 (five) minutes as needed for chest pain.     NOVOTWIST 32G X 5 MM Misc  Generic drug:  Insulin Pen Needle  USE ONE PER DAY TO INJECT VICTOZA     potassium chloride 10 MEQ tablet  Commonly known as:  KLOR-CON M10  Take 3 tablets (30 mEq total) by mouth daily.     pramipexole 0.5 MG tablet  Commonly known as:  MIRAPEX  Take 1 tablet (0.5 mg total) by mouth 2 (two) times daily.     spironolactone 25 MG tablet  Commonly known as:  ALDACTONE  TAKE 1 TABLET (25 MG TOTAL) BY MOUTH DAILY.     traMADol 50 MG tablet  Commonly known as:  ULTRAM  Take 1 tablet (50 mg total) by mouth 2 (two) times daily as needed.     travoprost (benzalkonium) 0.004 % ophthalmic solution  Commonly known as:  TRAVATAN  Place 1 drop into the right eye at bedtime.     trihexyphenidyl 2 MG tablet  Commonly known as:  ARTANE  TAKE 1 TABLET TWICE A DAY     V-GO 20 Kit  USE ONE PER DAY     vitamin B-12 1000 MCG tablet  Commonly known as:  CYANOCOBALAMIN  Take 1,000 mcg by mouth daily.        Allergies:  Allergies  Allergen Reactions  . Penicillins Rash and Other (See Comments)    WEARS ALLERGY BRACELET Because of a history of documented adverse serious drug reaction;Medi Alert bracelet  is recommended Has patient had a PCN reaction causing immediate rash, facial/tongue/throat swelling, SOB or lightheadedness with hypotension: Yes Has patient had a PCN reaction causing  severe rash involving mucus membranes or skin necrosis: unknown Has patient had a PCN reaction that required hospitalization NO Has patient had a PCN reaction occurring within the last 10 years: NO If all of     Past Medical History  Diagnosis Date  . Cardiomyopathy   . CAD (coronary artery disease)   . HTN (hypertension)     pt denies 08/19/12  . GERD (gastroesophageal reflux disease)   . Hyperplasia, prostate   . Benign neoplasm of colon   . Nephrolithiasis   . Ventral hernia   . HLD (hyperlipidemia)   . Parkinson disease ([place])     1999  . Complication of anesthesia     pt states that he got a rash  . Shortness of breath     Hx: of at all times  . UTI (lower urinary tract infection) 09/15/12    Klebsiella  . Asthma   . DM (diabetes mellitus) ([place])     TYPE 2   .  Hyperthyroidism     thyroid lobectomy  . Arthritis     cane  . AICD (automatic cardioverter/defibrillator) present     Dr Ladona Ridgel office visit yearly  . 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 MI (myocardial infarction) [facility])     Dr Jens Som 2000, x3vessels bypass  . Sleep apnea     does not use CPAP  . OSA (obstructive sleep apnea)     AHI-28,on CPAP, noncompliant with CPAP  . Sleep apnea, organic   . Restless legs   . Constipation   . Glaucoma     right eye  . PONV (postoperative nausea and vomiting)     Past Surgical History  Procedure Laterality Date  . Coronary artery bypass graft  2000    Tressie Stalker, MD  . Coronary stent placement  1998  . Median sternotomy  2000  . Deep brain stimulator placement  2004    Right and left VIN stimulator placement (parkinsons)  . 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 Finger amputation      left pointer  . Cataract extraction w/ intraocular lens implant      Hx: of right eye  . Tonsillectomy    . Colonoscopy w/ biopsies and polypectomy      Hx: of  . Cardiac catheterization    . Subthalamic stimulator battery  replacement N/A 09/05/2012    Procedure: Deep brain stimulator battery change;  Surgeon: Maeola Harman, MD;  Location: [facility]UR[facility] OR[facility];  Service: Neurosurgery;  Laterality: N/A;  Deep brain stimulator battery change  . Left and right heart catheterization with coronary angiogram N/A 09/24/2013    Procedure: LEFT AND RIGHT HEART CATHETERIZATION WITH CORONARY ANGIOGRAM;  Surgeon: Kathleene Hazel, MD;  Location: [facility] CATH LAB;  Service: Cardiovascular;  Laterality: N/A;  . Implantable cardioverter defibrillator implant N/A 11/13/2013    Procedure: IMPLANTABLE CARDIOVERTER DEFIBRILLATOR IMPLANT;  Surgeon: Marinus Maw, MD;  Location: [facility] CATH LAB;  Service: Cardiovascular;  Laterality: N/A;  . Eye surgery    . Insert / replace / remove pacemaker    . Colonoscopy N/A 10/13/2014    Procedure: COLONOSCOPY;  Surgeon: Beverley Fiedler, MD;  Location: [facility] ENDOSCOPY;  Service: Gastroenterology;  Laterality: N/A;  . Lithotripsy      3 different times  . Subthalamic stimulator battery replacement N/A 06/10/2015    Procedure: Deep Brain stimulator battery change;  Surgeon: Maeola Harman, MD;  Location: [facility] NEUR[facility] ORS;  Service: Neurosurgery;  Laterality: N/A;    Family History  Problem Relation Age of Onset  . Peripheral vascular disease    . Arthritis    . Aneurysm Mother   . Alcoholism Father   . Autoimmune disease Brother 35    AIDS  . Diabetes Neg Hx   . Heart disease Neg Hx   . Colon cancer Neg Hx   . Prostate cancer Neg Hx     Social History:  reports that he has never smoked. He has never used smokeless tobacco. He reports that he drinks alcohol. He reports that he does not use illicit drugs.    Review of Systems     Most recent eye exam was done in 2/17       Lipids: On Lipitor 40 mg, tends to have high triglycerides and low HDL; did have CABG in the year 2000      Lab Results  Component Value Date   CHOL  147 05/19/2014   HDL 35.50* 05/19/2014   LDLCALC 126* 04/08/2013   LDLDIRECT 86.0  05/19/2014   TRIG 213.0* 05/19/2014   CHOLHDL 4 05/19/2014                  Thyroid:   He has had hypothyroidism following radioactive iodine treatment for hyperthyroidism in 2013  TSH is normal with  dosage of 125 g   Lab Results  Component Value Date   TSH 2.06 12/15/2014   TSH 3.57 08/20/2014   TSH 4.60* 01/13/2014   FREET4 0.82 08/20/2014   FREET4 0.98 11/20/2011   FREET4 0.91 10/16/2011       The blood pressure has been followed by [facility], currently taking losartan and 25 mg Aldactone   Lab Results  Component Value Date   CREATININE 1.27 06/21/2015   BUN 23 06/21/2015   NA 139 06/21/2015   K 4.0 06/21/2015   CL 100 06/21/2015   CO2 31 06/21/2015     Diabetic foot exam in 1/16 showed shows normal monofilament sensation in the toes except decreased on the right great toe and plantar surfaces, no skin lesions or ulcers on the feet and normal pedal pulses    Physical Examination:  BP 132/84 mmHg  Pulse 80  Wt 262 lb 2 oz (118.899 kg)  SpO2 95%     ASSESSMENT/PLAN  Diabetes type 2, uncontrolled with obesity    See history of present illness for detailed discussion of his current management, blood sugar patterns and problems identified His blood sugars are variably high in the morning and his wife thinks these are related to consistent diet in the evenings and snacks late at night Also his sugars can be higher with meals when he is going off his diet and eating fried food He is usually not checking blood sugars consistently after his evening meal Also probably can do at least 2 units bolus for his late night snack  Since A1c is lower than expected will also check fructosamine on his next visit Discussed improving diet consistently, more blood sugars after meals, watching portions and trying to be as active as possible  HYPERTENSION: Still well controlled  Hypothyroidism: Will need follow-up TSH Recommended general physical exam with PCP    Patient  Instructions  Less fried food  1 click for bedtime snack, reduce cookies     Counseling time on subjects discussed above is over 50% of today's 25 minute visit  Halynn Reitano 06/25/2015, 10:03 AM   Note: This office note was prepared with Estate agent. Any transcriptional errors that result from this process are unintentional.

## 2015-07-01 ENCOUNTER — Other Ambulatory Visit: Payer: Self-pay | Admitting: Neurology

## 2015-07-02 NOTE — Telephone Encounter (Signed)
Clonazepam refill requested. Per last office note- patient to remain on medication. Refill approved and sent to patient's pharmacy.   

## 2015-07-07 ENCOUNTER — Other Ambulatory Visit: Payer: Self-pay | Admitting: Endocrinology

## 2015-07-23 ENCOUNTER — Other Ambulatory Visit: Payer: Self-pay | Admitting: Family Medicine

## 2015-07-26 ENCOUNTER — Other Ambulatory Visit: Payer: Self-pay

## 2015-07-27 ENCOUNTER — Ambulatory Visit (INDEPENDENT_AMBULATORY_CARE_PROVIDER_SITE_OTHER): Payer: Medicare Other | Admitting: *Deleted

## 2015-07-27 DIAGNOSIS — I429 Cardiomyopathy, unspecified: Secondary | ICD-10-CM

## 2015-07-27 DIAGNOSIS — I5022 Chronic systolic (congestive) heart failure: Secondary | ICD-10-CM | POA: Diagnosis not present

## 2015-07-27 MED ORDER — SPIRONOLACTONE 25 MG PO TABS: ORAL_TABLET | ORAL | Status: DC

## 2015-07-27 NOTE — Progress Notes (Signed)
Remote ICD transmission.   

## 2015-07-29 ENCOUNTER — Encounter: Payer: Self-pay | Admitting: Cardiology

## 2015-07-29 ENCOUNTER — Telehealth: Payer: Self-pay | Admitting: Internal Medicine

## 2015-07-29 NOTE — Telephone Encounter (Signed)
Henry [name] is calling to get verification on the Spironolactone and Potassium and they want to know that his potassium is being monitored. Please call   Thanks

## 2015-07-30 NOTE — Telephone Encounter (Signed)
Called pharmacy and let them know that Sprionolactone an Potassium are being followed by the patient's PCP, we don't fill these meds.

## 2015-08-06 DIAGNOSIS — M545 Low back pain: Secondary | ICD-10-CM | POA: Diagnosis not present

## 2015-08-06 DIAGNOSIS — M47817 Spondylosis without myelopathy or radiculopathy, lumbosacral region: Secondary | ICD-10-CM | POA: Diagnosis not present

## 2015-08-09 LAB — CUP PACEART REMOTE DEVICE CHECK
Battery Voltage: 3.01 V
Brady Statistic AS VP Percent: 0.03 %
Brady Statistic AS VS Percent: 97.72 %
HighPow Impedance: 68 Ohm
Implantable Lead Implant Date: 20151105
Implantable Lead Location: 753859
Lead Channel Impedance Value: 437 Ohm
Lead Channel Pacing Threshold Amplitude: 1.125 V
Lead Channel Pacing Threshold Pulse Width: 0.4 ms
Lead Channel Pacing Threshold Pulse Width: 0.4 ms
Lead Channel Sensing Intrinsic Amplitude: 1.875 mV
Lead Channel Sensing Intrinsic Amplitude: 16.125 mV
Lead Channel Setting Pacing Pulse Width: 0.4 ms
MDC IDC LEAD IMPLANT DT: 20151105
MDC IDC LEAD LOCATION: 753860
MDC IDC MSMT BATTERY REMAINING LONGEVITY: 120 mo
MDC IDC MSMT LEADCHNL RA SENSING INTR AMPL: 1.875 mV
MDC IDC MSMT LEADCHNL RV IMPEDANCE VALUE: 380 Ohm
MDC IDC MSMT LEADCHNL RV IMPEDANCE VALUE: 456 Ohm
MDC IDC MSMT LEADCHNL RV PACING THRESHOLD AMPLITUDE: 0.375 V
MDC IDC MSMT LEADCHNL RV SENSING INTR AMPL: 16.125 mV
MDC IDC SESS DTM: 20170718071704
MDC IDC SET LEADCHNL RA PACING AMPLITUDE: 2.5 V
MDC IDC SET LEADCHNL RV PACING AMPLITUDE: 2.5 V
MDC IDC SET LEADCHNL RV SENSING SENSITIVITY: 0.3 mV
MDC IDC STAT BRADY AP VP PERCENT: 0.01 %
MDC IDC STAT BRADY AP VS PERCENT: 2.24 %
MDC IDC STAT BRADY RA PERCENT PACED: 2.25 %
MDC IDC STAT BRADY RV PERCENT PACED: 0.04 %

## 2015-08-16 ENCOUNTER — Other Ambulatory Visit: Payer: Self-pay | Admitting: Endocrinology

## 2015-08-18 ENCOUNTER — Other Ambulatory Visit: Payer: Self-pay | Admitting: Endocrinology

## 2015-08-18 ENCOUNTER — Encounter: Payer: Self-pay | Admitting: Cardiology

## 2015-08-18 ENCOUNTER — Other Ambulatory Visit: Payer: Self-pay | Admitting: Neurology

## 2015-08-18 NOTE — Progress Notes (Signed)
Letter  

## 2015-08-19 DIAGNOSIS — M47817 Spondylosis without myelopathy or radiculopathy, lumbosacral region: Secondary | ICD-10-CM | POA: Diagnosis not present

## 2015-08-19 DIAGNOSIS — M545 Low back pain: Secondary | ICD-10-CM | POA: Diagnosis not present

## 2015-09-01 ENCOUNTER — Other Ambulatory Visit: Payer: Self-pay | Admitting: Family Medicine

## 2015-09-01 NOTE — Telephone Encounter (Signed)
Received refill request electronically Last refill 01/21/15 #60/2 Last office visit 03/15/15

## 2015-09-02 ENCOUNTER — Other Ambulatory Visit: Payer: Self-pay | Admitting: Neurology

## 2015-09-02 NOTE — Telephone Encounter (Signed)
Please call in.  Thanks.   

## 2015-09-02 NOTE — Telephone Encounter (Signed)
Medication phoned to pharmacy.  

## 2015-09-03 DIAGNOSIS — M545 Low back pain: Secondary | ICD-10-CM | POA: Diagnosis not present

## 2015-09-03 DIAGNOSIS — M47817 Spondylosis without myelopathy or radiculopathy, lumbosacral region: Secondary | ICD-10-CM | POA: Diagnosis not present

## 2015-09-03 MED ORDER — PRAMIPEXOLE DIHYDROCHLORIDE ER 1.5 MG PO TB24: 1.0000 | ORAL_TABLET | Freq: Every day | ORAL | 5 refills | Status: DC

## 2015-09-03 NOTE — Telephone Encounter (Signed)
Pt called to ck on status of tramadol refill; Kim at [facility] said ready for pick up. Pt voiced understanding.

## 2015-09-08 NOTE — Progress Notes (Signed)
Henry [name] was seen today in the movement disorders clinic for neurologic consultation at the request of [name].  His PCP is [name], MD.  The consultation is for the evaluation of PD and to manage his DBS.  He is accompanied by his wife who supplements the history.  The first symptom(s) the patient noticed was right hand tremor in 1999.  He was seen by neurology and was dx with PD.  He was placed on something, but it caused sleepiness.  He does not think that he has ever been on levodopa.   He was placed on Mirapex, which seemed to help.  He only takes it twice per day.  He didn't think it made a difference when he took it tid.  He began to have tremor and it was suggested he do DBS.    The pt is s/p stn DBS in 2005.  He had a battery change in 2009.    10/02/12 update: The patient is accompanied today by his daughter, who supplements the history.  I reviewed medical records available to me since last visit.  The patient had his generator changed on 09/05/2012.  He remains on pramipexole, 0.5 mg twice per day.  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 [name] in that regard.  He is not compliant with CPAP so says that he doesn't want to return to her for f/u. He doesn't take the klonopin faithfully.  10/11/12 update:  Pt was seen as a walk in/work in today.  Pt had increasing tremor, L greater than R for 3 days.  Thinks that it started with the d/c of artane.  Speech stable.  11/19/12 update:  Pt is seen today for his PD, accompanied by his daughter who supplements the hx.    He is currently on klonopin 0.5 mg - 1/2-1 tablet q hs.  He only takes it when his wife is not working.  She works nights and is only in 2 days per week.  He has some reluctance to take it other nights.  He is on pramipexole 0.5 mg bid.  Last visit, his DBS was reset more similar to the settings he had prior to coming here, just with an increased voltage.  About 2 weeks after our last  visit, the patient decided to go back on the Artane.  The combination of the Artane and the DBS changes helped significantly.  He does ask if I can slightly increased voltage on the left hand, as he has some tremor at night that is bothersome.  Otherwise, he is doing well.  He feels that his balance has been great.  02/19/13 update:  This patient is accompanied in the office by his child who supplements the history.  The pt has a hx of PD.  He has been tremor free and is very happy about that.  He has had some increased balance loss; the last fall was a few months ago but he hasn't gotten hurt.  Was considering hernia surgery.  The records that were made available to me were reviewed.  He is holding on that for now but plans to have it done before end of year.  No hallucinations.  He is still having some acting out of the dreams, despite clonazepam.  06/19/13 update:  Patient is returning to followup regarding his Parkinson's disease.  I had the opportunity to review records since last visit.  He went to the emergency room on 05/12/2013   with chest pain and shortness of breath.  He had been fishing and had missed several doses of Lasix.  He had been eating seafood.  He ended up with an acute exacerbation of his chronic congestive heart failure.  Once he was diuresed and back on his medications, he is feeling better.  He just had a nuclear medicine study done on 06/17/2013 and the ejection fraction on this looked better than on his echocardiogram.  The left ventricular ejection fraction was 41%.  Prior to that, there was some concern that his left ventricular ejection fraction had dropped so much that he may need an ICD.  In terms of Parkinson's disease, the patient states that he has been doing very well in terms of tremor, but his walking really has deteriorated.  He describes a festinating gait, with much more shuffling.  He fell walking over his dog gate but otherwise has not had falls.  He went fishing  yesterday and states that he "stumbled all day long."  No hallucinations.  He has been trying to be more faithful with his CPAP. He remains on Mirapex 0.5 mg twice a day, Artane 2 mg twice a day.  10/23/13 update:  The patient returns today to the clinic, accompanied by his wife who supplements the history.  From a Parkinson's standpoint, the patient states that he has been doing very well.  No tremor.  He started on levodopa last visit and states that he has had no falls and overall stumbling has been much better.  He states that the only time that he stumbles is if he is inside of his fishing boat, and states that that is really because it is small and unstable.  He is taking his levodopa in the morning, after lunch and bedtime.  He has had no hallucinations.  His wife states that he is still draining and acting out the dreams and even fell out of bed a few days ago.  However, he is taking his clonazepam about 10 PM but doesn't go to bed until 1 AM.  He is not using his CPAP.  Is having significant constipation.   I reviewed his cardiology records since last visit.  He did have a heart catheterization and is scheduled to have an ICD placed on November 5.  02/23/14 update:  Pt returns for follow up accompanied by his wife who supplements the history.  The records that were made available to me were reviewed since last visit.  He had an ICD placed on 11/15/13.  He is doing well.  Taking carbidopa/levodopa 25/100 three times a day and remains on artane as well as mirapex 0.5 mg bid.  Had a fall up the stairs.  Wife describes festinating gate and pt thinks that levodopa contributes.  He also doesn't feel good much of the time and thinks that is from levodopa.  Admits, however, that when BS under good control, he feels good.  Has started seeing endocrinology and started on new meds.  He has klonopin for RBD.  Still vivid dreams but no falling out bed (in recliner now).  Having some word finding trouble and memory  doesn't seem clear.  Also, put back tailgate down on car and came down on generator and wants me to look at that.    07/01/14 update:  The patient follows up today regarding his Parkinson's disease.  He is accompanied by his wife who supplements the history.  Last visit, the patient was complaining about memory change which I  thought was likely multifactorial and due to medication such as Artane, Mirapex and Ultram, which he was taking every 3 hours.  The patient, however, thought that it was from levodopa and so we held it and the patient reported that he felt better and therefore continued to stay off of the medication.  He reports that falls are better after d/c levodopa but wife reports still falls.  Pt states that he only fell in shower after he d/c levodopa.  His wife does admit that she thinks that levodopa was causing loss of balance.  He is having more tremor over the last month on the L hand.   He remains on clonazepam 0.5 mg, 1-1/2 tablets at night for REM behavior disorder.  I reviewed records since last visit.  He has seen Dr. Kumar multiple times in regards to his uncontrolled diabetes.  He started on insulin on May 22 2014.  He states that he is doing well with injecting himself.  Having a lot of constipation.  Has the rancho recipe.    11/03/14 update:  The patient is following up today, accompanied by his wife who supplements the history.  Records were reviewed since our last visit.  The patient is on pramipexole 0.5 mg twice a day and Artane 2 mg twice a day.  He has not had any hallucinations.  Had a fall the other day walking up stairs carrying something and fell backward.  Hit his back.  No LOC.  Also fell backward in the summer around the pool.  Fell one time out of his boat.  Doesn't want to use a walker. He was in the emergency room recently with a rash that was felt secondary to insect bites that he got in the woods.  He also recently had a colonoscopy.  His diabetes is under better  control and his last A1c was 7.2.  States that he now has an insulin pump.  He remains on clonazepam 0.5 mg, 1-1/2 tablets at night for REM behavior disorder.  More back pain lately and will start injections for the back which have helped previously  02/04/15 update:  The patient presents today, accompanied by his wife who supplements the history.  I have reviewed prior records made available to me.  He has established a new primary care doctor in Dr. Duncan.  The patient remains on Artane, 2 mg twice a day as well as pramipexole 0.5 mg twice a day.  Overall, he has been doing fairly well.  He occasionally still has some tremor.  He had 2 falls since our last visit; he states that he was pushing a gate open and it came back and hit him.  He was also carrying wood the other day and fell forward with wood.  No hallucinations.  No lightheadedness or near syncope.  He remains on clonazepam 0.5 mg, 1-1/2 tablets at night for REM behavior disorder.  [facility] at [facility]    06/08/15 update:  This patient is accompanied in the office by his spouse who supplements the history.  The patient remains on Artane, 2 mg twice a day as well as pramipexole 0.5 mg twice a day.  He tripped over the curb [place] [employer].  He tripped over the step on his deck.   He remains on clonazepam 0.5 mg, 1-1/2 tablets at night for REM behavior disorder.  He is still having trouble with that.  He fell out of bed the other night.  Wife   asks about bed rails.   He felt that [facility] helped.  Is looking forward to getting in his pool.    09/09/15 update:  The patient follows up today, accompanied by his wife who supplements the history.  He is on pramipexole 0.5 g twice a day and trihexyphenidyl 2 mg twice a day.  Didn't go up to the 1.5 mg of pramipexole.   He is on clonazepam 0.5 mg, 1-1/2 tablets at night for REM behavior disorder.  His biggest issue is RLS at bedtime.  It is preventing him from going to sleep.    He denies any hallucinations.  He denies cognitive change.  He continues to struggle with balance and has fallen/stumbled a few times.  He has fallen in the boat.  Wife describes festinating gait.  Wife also describes crying frequently.  Denies depression. He has his battery changed on 06/10/15   PREVIOUS MEDICATIONS: Mirapex and artane  ALLERGIES:   Allergies  Allergen Reactions  . Penicillins Rash and Other (See Comments)    WEARS ALLERGY BRACELET Because of a history of documented adverse serious drug reaction;Medi Alert bracelet  is recommended Has patient had a PCN reaction causing immediate rash, facial/tongue/throat swelling, SOB or lightheadedness with hypotension: Yes Has patient had a PCN reaction causing severe rash involving mucus membranes or skin necrosis: unknown Has patient had a PCN reaction that required hospitalization NO Has patient had a PCN reaction occurring within the last 10 years: NO If all of     CURRENT MEDICATIONS:     Medication List       Accurate as of 09/08/15  8:03 AM. Always use your most recent med list.          acetaminophen 325 MG tablet Commonly known as:  TYLENOL Take 1-2 tablets (325-650 mg total) by mouth every 4 (four) hours as needed for mild pain.   albuterol 108 (90 Base) MCG/ACT inhaler Commonly known as:  PROVENTIL HFA;VENTOLIN HFA Inhale 2 puffs into the lungs every 6 (six) hours as needed for wheezing or shortness of breath.   aspirin EC 81 MG tablet Take 81 mg by mouth daily.   atorvastatin 40 MG tablet Commonly known as:  LIPITOR TAKE 1 TABLET (40 MG TOTAL) BY MOUTH DAILY.   clonazePAM 0.5 MG tablet Commonly known as:  KLONOPIN TAKE 1 & 1/2 TABLETS BY MOUTH AT BEDTIME   Fluticasone-Salmeterol 250-50 MCG/DOSE Aepb Commonly known as:  ADVAIR Inhale 1 puff into the lungs 2 (two) times daily.   furosemide 80 MG tablet Commonly known as:  LASIX TAKE 1.5 TABLETS BY MOUTH DAILY.   glimepiride 4 MG tablet Commonly  known as:  AMARYL TAKE 1 TABLET BY MOUTH EVERY DAY IN THE MORNING AND 1/2 AT SUPPER   glucose blood test strip Commonly known as:  ONE TOUCH ULTRA TEST Use as instructed to check blood sugar 2 times a day dx code E11.59   Home Style Bed Rails Misc 1 Device by Does not apply route daily.   ibuprofen 200 MG tablet Commonly known as:  ADVIL,MOTRIN Take 800 mg by mouth every 6 (six) hours as needed (For pain.).   insulin lispro 100 UNIT/ML injection Commonly known as:  HUMALOG Use max 56 units per day with V-go pump   levothyroxine 125 MCG tablet Commonly known as:  SYNTHROID, LEVOTHROID TAKE 1 TABLET EVERY DAY DAILY BEFORE BREAKFAST   metFORMIN 1000 MG tablet Commonly known as:  GLUCOPHAGE TAKE 1 TABLET (1,000 MG TOTAL) BY MOUTH  2 (TWO) TIMES DAILY WITH A MEAL.   metoprolol succinate 50 MG 24 hr tablet Commonly known as:  TOPROL-XL TAKE 1 TABLET BY MOUTH DAILY. TAKE WITH OR IMMEDIATELY FOLLOWING A MEAL.   multivitamin with minerals Tabs tablet Take 1 tablet by mouth daily.   nitroGLYCERIN 0.4 MG SL tablet Commonly known as:  NITROSTAT Place 1 tablet (0.4 mg total) under the tongue every 5 (five) minutes as needed for chest pain.   NOVOTWIST 32G X 5 MM Misc Generic drug:  Insulin Pen Needle USE ONE PER DAY TO INJECT VICTOZA   potassium chloride 10 MEQ tablet Commonly known as:  KLOR-CON M10 Take 3 tablets (30 mEq total) by mouth daily.   pramipexole 0.5 MG tablet Commonly known as:  MIRAPEX Take 1 tablet (0.5 mg total) by mouth 2 (two) times daily.   Pramipexole Dihydrochloride 1.5 MG Tb24 Commonly known as:  MIRAPEX ER Take 1 tablet (1.5 mg total) by mouth daily.   spironolactone 25 MG tablet Commonly known as:  ALDACTONE TAKE 1 TABLET (25 MG TOTAL) BY MOUTH DAILY.   traMADol 50 MG tablet Commonly known as:  ULTRAM TAKE 1 TABLET BY MOUTH TWICE A DAY AS NEEDED   travoprost (benzalkonium) 0.004 % ophthalmic solution Commonly known as:  TRAVATAN Place 1 drop  into the right eye at bedtime.   trihexyphenidyl 2 MG tablet Commonly known as:  ARTANE TAKE 1 TABLET TWICE A DAY   V-GO 20 Kit USE ONE PER DAY   VICTOZA 18 MG/3ML Sopn Generic drug:  Liraglutide INJECT 1.8MG ONCE DAILY   vitamin B-12 1000 MCG tablet Commonly known as:  CYANOCOBALAMIN Take 1,000 mcg by mouth daily.        PAST MEDICAL HISTORY:   Past Medical History:  Diagnosis Date  . AICD (automatic cardioverter/defibrillator) present    Dr Lovena Le office visit yearly  . Arthritis    cane  . Asthma   . Benign neoplasm of colon   . CAD (coronary artery disease)   . Cardiomyopathy   . Complication of anesthesia    pt states that he got a rash  . Constipation   . 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 DM (diabetes mellitus) ([place])    TYPE 2   . GERD (gastroesophageal reflux disease)   . Glaucoma    right eye  . HLD (hyperlipidemia)   . HTN (hypertension)    pt denies 08/19/12  . Hyperplasia, prostate   . Hyperthyroidism    thyroid lobectomy  . MI (myocardial infarction) [facility])    Dr Stanford Breed 2000, x3vessels bypass  . Nephrolithiasis   . OSA (obstructive sleep apnea)    AHI-28,on CPAP, noncompliant with CPAP  . Parkinson disease ([place])    1999  . PONV (postoperative nausea and vomiting)   . Restless legs   . Shortness of breath    Hx: of at all times  . Sleep apnea    does not use CPAP  . Sleep apnea, organic   . UTI (lower urinary tract infection) 09/15/12   Klebsiella  . Ventral hernia     PAST SURGICAL HISTORY:   Past Surgical History:  Procedure Laterality Date  . 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 CARDIAC CATHETERIZATION    . CATARACT EXTRACTION W/ INTRAOCULAR LENS IMPLANT     Hx: of right eye  . COLONOSCOPY N/A 10/13/2014   Procedure: COLONOSCOPY;  Surgeon: Jerene Bears, MD;  Location: [facility] ENDOSCOPY;  Service: Gastroenterology;  Laterality: N/A;  . COLONOSCOPY W/ BIOPSIES AND POLYPECTOMY     Hx: of  .  CORONARY ARTERY BYPASS GRAFT  2000   [name], MD  . [place]  . DEEP BRAIN STIMULATOR PLACEMENT  2004   Right and left VIN stimulator placement (parkinsons)  . EYE SURGERY    . FINGER AMPUTATION     left pointer  . IMPLANTABLE CARDIOVERTER DEFIBRILLATOR IMPLANT N/A 11/13/2013   Procedure: IMPLANTABLE CARDIOVERTER DEFIBRILLATOR IMPLANT;  Surgeon: Evans Lance, MD;  Location[facility] C[facility] LAB;  Service: Cardiovascular;  Laterality: N/A;  . INSERT / REPLACE / REMOVE PACEMAKER    . LEFT AND RIGHT HEART CATHETERIZATION WITH CORONARY ANGIOGRAM N/A 09/24/2013   Procedure: LEFT AND RIGHT HEART CATHETERIZATION WITH CORONARY ANGIOGRAM;  Surgeon: Burnell Blanks, MD;  Location: [facility] CATH LAB;  Service: Cardiovascular;  Laterality: N/A;  . LITHOTRIPSY     3 different times  . MEDIAN STERNOTOMY  2000  . SUBTHALAMIC STIMULATOR BATTERY REPLACEMENT N/A 09/05/2012   Procedure: Deep brain stimulator battery change;  Surgeon: Erline Levine, MD;  Location: [place] NEURO ORS;  Service: Neurosurgery;  Laterality: N/A;  Deep brain stimulator battery change  . SUBTHALAMIC STIMULATOR BATTERY REPLACEMENT N/A 06/10/2015   Procedure: Deep Brain stimulator battery change;  Surgeon: Erline Levine, MD;  Location: [place] NEURO ORS;  Service: Neurosurgery;  Laterality: N/A;  . TONSILLECTOMY      SOCIAL HISTORY:   Social History   Social History  . Marital status: Married    Spouse name: [name]  . Number of children: 2  . Years of education: N/A   Occupational History  . 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 Smoking status: Never Smoker  . Smokeless tobacco: Never Used  . Alcohol use Yes     Comment: occasional beer  . Drug use: No  . Sexual activity: Not on file   Other Topics Concern  . Not on file   Social History Narrative   From [facility]   Retired/disability Clinical research associate   Likes to fish.     Married 1972   3 kids   Elizabeth fan     FAMILY HISTORY:   Family Status  Relation Status  . Mother Deceased   complications of surgery  . Father Deceased   EtOHism  . Brother Alive   1 brother, 1 half brother  . Sister Alive   2 half sisters  . Child Alive   3, alive and well    ROS:  A complete 10 system review of systems was obtained and was unremarkable apart from what is mentioned above.  PHYSICAL EXAMINATION:    VITALS:   There were no vitals filed for this visit.  GEN:  The patient appears stated age and is in NAD. HEENT:  Normocephalic, atraumatic.  The mucous membranes are moist. The superficial temporal arteries are without ropiness or tenderness. CV:  RRR Lungs:  CTAB Neck/HEME:  There are no carotid bruits bilaterally.   Neurological examination:  Orientation: The patient is alert and oriented x3.  Cranial nerves: There is good facial symmetry.  The visual fields are full to confrontational testing. The speech is fluent and just mildly dysarthric.  He is hypophonic. Soft palate rises symmetrically and there is no tongue deviation. Hearing is intact to conversational tone. Motor: Strength is 5/5 in the bilateral upper and lower extremities.  Shoulder shrug is equal and symmetric.  There is no pronator drift.  Movement examination: Tone: There is normal tone in the UE and LE bilaterally Abnormal movements: There is no tremor today Coordination:  There is slight decremation with finger taps and alternation of supination/pronation of the forearm b/l Gait and Station: The patient has minimal difficulty arising out of a deep-seated chair without the use of the hands. The patient's stride length is decreased and he shuffles a little and starts to festinate.    LABS  Lab Results  Component Value Date   WBC 6.5 06/10/2015   HGB 12.5 (L) 06/10/2015   HCT 39.0 06/10/2015   MCV 85.5 06/10/2015   PLT 163 06/10/2015   Lab Results  Component Value Date   TSH 2.06 12/15/2014     Chemistry       Component Value Date/Time   NA 139 06/21/2015 1331   K 4.0 06/21/2015 1331   CL 100 06/21/2015 1331   CO2 31 06/21/2015 1331   BUN 23 06/21/2015 1331   CREATININE 1.27 06/21/2015 1331   CREATININE 1.04 06/05/2013 1020      Component Value Date/Time   CALCIUM 9.2 06/21/2015 1331   ALKPHOS 68 06/21/2015 1331   AST 12 06/21/2015 1331   ALT 19 06/21/2015 1331   BILITOT 0.3 06/21/2015 1331     Lab Results  Component Value Date   VITAMINB12 268 08/19/2012   Lab Results  Component Value Date   HGBA1C 6.2 06/21/2015   DBS programming was performed today, which is described in more detail on a separate programming procedural notes. No tremor after programming  ASSESSMENT/PLAN:  1.  Idiopathic Parkinson's disease.    -I again talked to the patient about the Artane, which he is taking 2 mg twice a day.  I expressed to him that this can cause loss of balance and confusion.  However, when we tried to stop it in the past, he was not able to tolerate tremor.    -d/c pramipexole 0.5 mg bid and change to Mirapex ER 1.5 mg daily.  Hopefully, this will help restless leg syndrome.  If not, we can try low dose levodopa at night or try gabapentin at night.   -His DBS battery was last changed on 09/05/2012 and on 06/10/15.    -Told him that he really needs to use his walker at all times for the festinating gait.  This is really the reason he is falling. 2.  RBD  -He will continue his clonazepam 0.5 mg, to 1-1/2 tablets per night .  Having falling out of bed despite this.  May need to send to sleep specialist.  Not using the bed rails. 3.  Pseudobulbar affect (crying)  -Talked to him about the nature and pathophysiology.  Really does not want any medication for this.  We'll limit me know if he changes his mind. 4.  OSAS, noncompliant with CPAP.  -Unfortunately, he has been unable to tolerate CPAP 5.  Sialorrhea  -This is associated with Parkinson's disease.  He may benefit from Myobloc in the  future. 6.  Dysphagia.  -Overall, this is fairly mild but I will give an eye on this and consider a MBE if needed. 7.  CHF  -ICD placed on 11/15/2013 8.  Constipation  -copy of rancho recipe given although he still has the problem. 9.  Uncontrolled DM  -Doing much better with insulin pump 10.  Follow-up in the next few months, sooner should new neurologic issues  arise.   Much greater than 50% of this visit was spent in counseling and coordinating care.  Total face to face time:  25 min which didn't include DBS time

## 2015-09-09 ENCOUNTER — Ambulatory Visit (INDEPENDENT_AMBULATORY_CARE_PROVIDER_SITE_OTHER): Payer: Medicare Other | Admitting: Neurology

## 2015-09-09 ENCOUNTER — Encounter: Payer: Self-pay | Admitting: Neurology

## 2015-09-09 VITALS — BP 110/70 | HR 78 | Ht 71.0 in | Wt 268.0 lb

## 2015-09-09 DIAGNOSIS — G4752 REM sleep behavior disorder: Secondary | ICD-10-CM

## 2015-09-09 DIAGNOSIS — G20 Parkinson's disease: Secondary | ICD-10-CM

## 2015-09-09 DIAGNOSIS — G2581 Restless legs syndrome: Secondary | ICD-10-CM

## 2015-09-09 DIAGNOSIS — I255 Ischemic cardiomyopathy: Secondary | ICD-10-CM

## 2015-09-09 DIAGNOSIS — F482 Pseudobulbar affect: Secondary | ICD-10-CM | POA: Diagnosis not present

## 2015-09-09 MED ORDER — PRAMIPEXOLE DIHYDROCHLORIDE ER 1.5 MG PO TB24: 1.5000 mg | ORAL_TABLET | Freq: Every day | ORAL | 4 refills | Status: DC

## 2015-09-09 NOTE — Procedures (Signed)
DBS Programming was performed.    Total time spent programming was 20 minutes.  Device was confirmed to be on.  Soft start was confirmed to be on.  Impedences were checked and were within normal limits.  Battery was checked and was determined to be functionally normally and not near end of life.    Detailed analysis on separate neurophysiologic worksheet.    Final settings were as follows:  Left brain electrode:     1-2+           ; Amplitude  4.0   V   ; Pulse width 90 microseconds;   Frequency   160   Hz.  Right brain electrode:     4-7+          ; Amplitude   4.3  V ;  Pulse width 90  microseconds;  Frequency   160    Hz.  

## 2015-09-10 ENCOUNTER — Encounter: Payer: Self-pay | Admitting: Family Medicine

## 2015-09-16 ENCOUNTER — Other Ambulatory Visit (INDEPENDENT_AMBULATORY_CARE_PROVIDER_SITE_OTHER): Payer: Medicare Other

## 2015-09-16 DIAGNOSIS — E1165 Type 2 diabetes mellitus with hyperglycemia: Secondary | ICD-10-CM

## 2015-09-16 DIAGNOSIS — Z794 Long term (current) use of insulin: Secondary | ICD-10-CM

## 2015-09-16 LAB — COMPREHENSIVE METABOLIC PANEL
ALBUMIN: 4.1 g/dL (ref 3.5–5.2)
ALK PHOS: 63 U/L (ref 39–117)
ALT: 33 U/L (ref 0–53)
AST: 15 U/L (ref 0–37)
BILIRUBIN TOTAL: 0.3 mg/dL (ref 0.2–1.2)
BUN: 20 mg/dL (ref 6–23)
CO2: 35 mEq/L — ABNORMAL HIGH (ref 19–32)
Calcium: 9 mg/dL (ref 8.4–10.5)
Chloride: 101 mEq/L (ref 96–112)
Creatinine, Ser: 1.29 mg/dL (ref 0.40–1.50)
GFR: 59.57 mL/min — AB (ref >60.00)
GLUCOSE: 137 mg/dL — AB (ref 70–99)
Potassium: 4.2 mEq/L (ref 3.5–5.1)
Sodium: 143 mEq/L (ref 135–145)
TOTAL PROTEIN: 6.7 g/dL (ref 6.0–8.3)

## 2015-09-16 LAB — LIPID PANEL
CHOL/HDL RATIO: 6
CHOLESTEROL: 182 mg/dL (ref 0–200)
HDL: 31.3 mg/dL — AB (ref >39.00)
LDL CALC: 118 mg/dL — AB (ref 0–99)
NonHDL: 150.85
TRIGLYCERIDES: 165 mg/dL — AB (ref 0.0–149.0)
VLDL: 33 mg/dL (ref 0.0–40.0)

## 2015-09-16 LAB — HEMOGLOBIN A1C: HEMOGLOBIN A1C: 6.9 % — AB (ref 4.6–6.5)

## 2015-09-16 LAB — TSH: TSH: 3.58 u[IU]/mL (ref 0.35–4.50)

## 2015-09-17 LAB — FRUCTOSAMINE: Fructosamine: 250 umol/L (ref 0–285)

## 2015-09-21 ENCOUNTER — Telehealth: Payer: Self-pay

## 2015-09-21 ENCOUNTER — Ambulatory Visit (INDEPENDENT_AMBULATORY_CARE_PROVIDER_SITE_OTHER): Payer: Medicare Other | Admitting: Family Medicine

## 2015-09-21 ENCOUNTER — Encounter: Payer: Self-pay | Admitting: Family Medicine

## 2015-09-21 VITALS — BP 106/66 | HR 80 | Temp 98.2°F | Ht 69.25 in | Wt 269.0 lb

## 2015-09-21 DIAGNOSIS — Z114 Encounter for screening for human immunodeficiency virus [HIV]: Secondary | ICD-10-CM

## 2015-09-21 DIAGNOSIS — E785 Hyperlipidemia, unspecified: Secondary | ICD-10-CM

## 2015-09-21 DIAGNOSIS — Y92009 Unspecified place in unspecified non-institutional (private) residence as the place of occurrence of the external cause: Secondary | ICD-10-CM

## 2015-09-21 DIAGNOSIS — Y92099 Unspecified place in other non-institutional residence as the place of occurrence of the external cause: Secondary | ICD-10-CM | POA: Diagnosis not present

## 2015-09-21 DIAGNOSIS — Z23 Encounter for immunization: Secondary | ICD-10-CM | POA: Diagnosis not present

## 2015-09-21 DIAGNOSIS — W19XXXA Unspecified fall, initial encounter: Secondary | ICD-10-CM | POA: Diagnosis not present

## 2015-09-21 DIAGNOSIS — Z7189 Other specified counseling: Secondary | ICD-10-CM

## 2015-09-21 DIAGNOSIS — I255 Ischemic cardiomyopathy: Secondary | ICD-10-CM

## 2015-09-21 DIAGNOSIS — G20 Parkinson's disease: Secondary | ICD-10-CM

## 2015-09-21 DIAGNOSIS — Z119 Encounter for screening for infectious and parasitic diseases, unspecified: Secondary | ICD-10-CM

## 2015-09-21 DIAGNOSIS — Z0000 Encounter for general adult medical examination without abnormal findings: Secondary | ICD-10-CM

## 2015-09-21 MED ORDER — LEVOTHYROXINE SODIUM 125 MCG PO TABS: 125.0000 ug | ORAL_TABLET | Freq: Every day | ORAL | Status: DC

## 2015-09-21 NOTE — Patient Instructions (Addendum)
Henry [name] will call about your referral. See her on the way out.   See if you can get PT covered by insurance.   Check with your insurance to see if they will cover the shingles shot. Check your lipitor dose at home.  If you have been off the medicine (or on it), then let me know.  Take care.  Glad to see you.

## 2015-09-21 NOTE — Progress Notes (Signed)
I have personally reviewed the Medicare Annual Wellness questionnaire and have noted 1. The patient's medical and social history 2. Their use of alcohol, tobacco or illicit drugs 3. Their current medications and supplements 4. The patient's functional ability including ADL's, fall risks, home safety risks and hearing or visual             impairment. 5. Diet and physical activities 6. Evidence for depression or mood disorders  The patients weight, height, BMI have been recorded in the chart and visual acuity is per eye clinic.  I have made referrals, counseling and provided education to the patient based review of the above and I have provided the pt with a written personalized care plan for preventive services.  Provider list updated- see scanned forms.  Routine anticipatory guidance given to patient.  See health maintenance.  Flu 2017 Shingles d/w pt.  PNA UTD Tetanus 2014 Colonoscopy 2016 Prostate cancer screening and PSA options (with potential risks and benefits of testing vs not testing) were discussed along with recent recs/guidelines.  He declined testing PSA at this point. Advance directive- wife designated if patient were incapacitated.  Cognitive function addressed- see scanned forms- and if abnormal then additional documentation follows.  Pt opts in for HIV screening.  D/w pt re: routine screening. Pt opts in for HCV screening.  D/w pt re: routine screening.    DM2 per endo.    Hyperlipidemia. See follow-up notes. Labs discussed with patient. It may be that he has been off of Lipitor recently. He may have had some aches on this medicine previously. See following notes.  Parkinson's disease. Per neurology. He has had some tearfulness. This may be related to Parkinson's. He will follow-up with Dr. Carles Collet if this is worse. He's also had some memory changes, he is following up neurology about this. I will defer to neurology.   PMH and SH reviewed  Meds, vitals, and allergies  reviewed.   ROS: Per HPI.  Unless specifically indicated otherwise in HPI, the patient denies:  General: fever. Eyes: acute vision changes ENT: sore throat Cardiovascular: chest pain Respiratory: SOB GI: vomiting GU: dysuria Musculoskeletal: acute back pain Derm: acute rash Neuro: acute motor dysfunction Psych: worsening mood Endocrine: polydipsia Heme: bleeding Allergy: hayfever  GEN: nad, alert and oriented, flat affect at baseline. HEENT: mucous membranes moist NECK: supple w/o LA CV: rrr. PULM: ctab, no inc wob ABD: soft, +bs EXT: trace edema SKIN: no acute rash

## 2015-09-21 NOTE — Telephone Encounter (Signed)
[name] left Henry [name]; pt was seen today; pt stopped lipitor 40 mg daily on 07/08/15.

## 2015-09-21 NOTE — Progress Notes (Signed)
Pre visit review using our clinic review tool, if applicable. No additional management support is needed unless otherwise documented below in the visit note. 

## 2015-09-22 ENCOUNTER — Other Ambulatory Visit: Payer: Self-pay | Admitting: Endocrinology

## 2015-09-22 MED ORDER — ATORVASTATIN CALCIUM 40 MG PO TABS: 20.0000 mg | ORAL_TABLET | Freq: Every day | ORAL | Status: DC

## 2015-09-22 NOTE — Telephone Encounter (Signed)
Patient's wife notified as instructed by telephone and verbalized understanding. Henry [name] stated that she will have him start back on the Lipitor and see if he can tolerated it. Henry [name] stated that she will call back and schedule the follow-up lab appointment as instructed if patient can tolerated the dose.

## 2015-09-22 NOTE — Telephone Encounter (Signed)
I would try to restart 1/2 tab a day.  See if he can tolerate that.  Med list updated.   If so, then recheck lipids in about 3 months.  If not tolerated then update me. Thanks.

## 2015-09-23 DIAGNOSIS — Z7189 Other specified counseling: Secondary | ICD-10-CM | POA: Insufficient documentation

## 2015-09-23 DIAGNOSIS — Z0000 Encounter for general adult medical examination without abnormal findings: Secondary | ICD-10-CM | POA: Insufficient documentation

## 2015-09-23 NOTE — Assessment & Plan Note (Signed)
LDL above goal. See follow-up notes. Will have patient restart Lipitor at half dose, 20 mg. We will see if he can tolerate that dose. Recheck lipids later on.

## 2015-09-23 NOTE — Assessment & Plan Note (Signed)
Flu 2017 Shingles d/w pt.  PNA UTD Tetanus 2014 Colonoscopy 2016 Prostate cancer screening and PSA options (with potential risks and benefits of testing vs not testing) were discussed along with recent recs/guidelines.  He declined testing PSA at this point. Advance directive- wife designated if patient were incapacitated.  Cognitive function addressed- see scanned forms- and if abnormal then additional documentation follows.  Pt opts in for HIV screening.  D/w pt re: routine screening. Pt opts in for HCV screening.  D/w pt re: routine screening.

## 2015-09-23 NOTE — Assessment & Plan Note (Signed)
History of falls noted. He has gait abnormality at baseline from Parkinson's. Refer to physical therapy for gait training. This has helped previously. He agrees. I will defer to neurology otherwise, with appreciation.

## 2015-09-24 ENCOUNTER — Ambulatory Visit: Payer: Medicare Other | Admitting: Endocrinology

## 2015-09-27 ENCOUNTER — Ambulatory Visit (INDEPENDENT_AMBULATORY_CARE_PROVIDER_SITE_OTHER): Payer: Federal, State, Local not specified - PPO | Admitting: Endocrinology

## 2015-09-27 ENCOUNTER — Other Ambulatory Visit: Payer: Self-pay | Admitting: *Deleted

## 2015-09-27 ENCOUNTER — Encounter: Payer: Self-pay | Admitting: Endocrinology

## 2015-09-27 VITALS — BP 142/72 | HR 81 | Temp 98.0°F | Resp 16 | Ht 69.0 in | Wt 270.6 lb

## 2015-09-27 DIAGNOSIS — R2689 Other abnormalities of gait and mobility: Secondary | ICD-10-CM | POA: Diagnosis not present

## 2015-09-27 DIAGNOSIS — I255 Ischemic cardiomyopathy: Secondary | ICD-10-CM

## 2015-09-27 DIAGNOSIS — R296 Repeated falls: Secondary | ICD-10-CM | POA: Diagnosis not present

## 2015-09-27 DIAGNOSIS — E782 Mixed hyperlipidemia: Secondary | ICD-10-CM | POA: Diagnosis not present

## 2015-09-27 DIAGNOSIS — M6281 Muscle weakness (generalized): Secondary | ICD-10-CM | POA: Diagnosis not present

## 2015-09-27 DIAGNOSIS — M545 Low back pain: Secondary | ICD-10-CM | POA: Diagnosis not present

## 2015-09-27 DIAGNOSIS — E1165 Type 2 diabetes mellitus with hyperglycemia: Secondary | ICD-10-CM

## 2015-09-27 DIAGNOSIS — Z794 Long term (current) use of insulin: Secondary | ICD-10-CM | POA: Diagnosis not present

## 2015-09-27 MED ORDER — ROSUVASTATIN CALCIUM 20 MG PO TABS: 20.0000 mg | ORAL_TABLET | Freq: Every day | ORAL | 3 refills | Status: DC

## 2015-09-27 MED ORDER — GLUCOSE BLOOD VI STRP: ORAL_STRIP | 5 refills | Status: DC

## 2015-09-27 MED ORDER — ONETOUCH DELICA LANCETS 33G MISC: 5 refills | Status: DC

## 2015-09-27 NOTE — Patient Instructions (Signed)
Call if sugars stay high on new meter  Check blood sugars on waking up  3-4x per week  Also check blood sugars about 2 hours after a meal and do this after different meals by rotation  Recommended blood sugar levels on waking up is 90-130 and about 2 hours after meal is 130-160  Please bring your blood sugar monitor to each visit, thank you  Less fatty foods and sweets

## 2015-09-27 NOTE — Progress Notes (Signed)
Patient ID: Henry [name], male   DOB: May 21, 1950, 65 y.o.   MRN: [id number]           Reason for Appointment: Follow-up for Type 2 Diabetes   History of Present Illness:          Diagnosis: Type 2 diabetes mellitus, date of diagnosis: 2000      Past history:   Patient thinks he has been taking Amaryl for several years and probably metformin since onset also At some point he was changed from metformin to [facility] and was taking this since at least 2012 A1c had been higher in 2015 Since his A1c had been progressively higher with his regimen of Janumet and Amaryl he was started on Victoza in 01/2014 He was started on Lantus insulin in 5/16 because of persistent hyperglycemia especially fasting; was having readings as high as 293  Recent history:   INSULIN regimen: V-go-20 pump and boluses 4-8 units; 6-8 at dinner  V-go pump  Replaced in the evening  Non-insulin hypoglycemic drugs the patient is taking are: Metformin 1 g twice a day, Amaryl 4 mg a.m., 2 mg p.m., Victoza 1.8 mg daily    Because of tendency to high postprandial readings and for control with Lantus he was switched to the V-go pump in  09/2014  His A1c is better with  The V-go pump but relatively higher now at 6.9, previously 6.2  Current management, blood sugar patterns and problems identified:  He is having overall high readings although somewhat inconsistent  His glucose readings are much higher than expected for his labs, both A1c and fructosamine which is now 250  His meter is a couple of years old at least and his glucose was 170 the morning his fasting lab glucose was 137 later that morning  His wife thinks that he is still eating more snacks and does not watch his diet and also does not avoid fried food or fatty meats.  Again has difficulty losing weight because of his diet and limitations with exercise  He checks his blood sugars sometime after evening meal, probably 2-3 hours later but not after other  meals usually  He has seen the dietitian in 3/16 Not able to exercise        Side effects from medications have been: None Compliance with the medical regimen: Fair  Hypoglycemia:   none  Glucose monitoring:  done 1-2 times  a day         Glucometer: One Touch ultra 2 .      Blood Glucose readings by   Mean values apply above for all meters except median for One Touch  PRE-MEAL Fasting Lunch Dinner Bedtime Overall  Glucose range: 130-293  141  143  197-255    Mean/median: 205    233  213   Self-care: The diet that the patient has been following is:  tries to eat more vegetables, some fried food and cookies, mostly sugar-free         Meals: 9am , 2-3 pm and 7 pm     Exercise: unable to do any         Dietician visit, most recent: 03/2014                Weight history:   Wt Readings from Last 3 Encounters:  09/27/15 270 lb 9.6 oz (122.7 kg)  09/21/15 269 lb (122 kg)  09/09/15 268 lb (121.6 kg)    Glycemic control:   Lab Results  Component Value Date   HGBA1C 6.9 (H) 09/16/2015   HGBA1C 6.2 06/21/2015   HGBA1C 6.5 03/22/2015   Lab Results  Component Value Date   MICROALBUR <0.7 12/15/2014   LDLCALC 118 (H) 09/16/2015   CREATININE 1.29 09/16/2015    No visits with results within 1 Week(s) from this visit.  Latest known visit with results is:  Lab on 09/16/2015  Component Date Value Ref Range Status  . Hgb A1c MFr Bld 09/16/2015 6.9* 4.6 - 6.5 % Final  . Sodium 09/16/2015 143  135 - 145 mEq/L Final  . Potassium 09/16/2015 4.2  3.5 - 5.1 mEq/L Final  . Chloride 09/16/2015 101  96 - 112 mEq/L Final  . CO2 09/16/2015 35* 19 - 32 mEq/L Final  . Glucose, Bld 09/16/2015 137* 70 - 99 mg/dL Final  . BUN 09/16/2015 20  6 - 23 mg/dL Final  . Creatinine, Ser 09/16/2015 1.29  0.40 - 1.50 mg/dL Final  . Total Bilirubin 09/16/2015 0.3  0.2 - 1.2 mg/dL Final  . Alkaline Phosphatase 09/16/2015 63  39 - 117 U/L Final  . AST 09/16/2015 15  0 - 37 U/L Final  . ALT 09/16/2015 33   0 - 53 U/L Final  . Total Protein 09/16/2015 6.7  6.0 - 8.3 g/dL Final  . Albumin 09/16/2015 4.1  3.5 - 5.2 g/dL Final  . Calcium 09/16/2015 9.0  8.4 - 10.5 mg/dL Final  . GFR 09/16/2015 59.57* >60.00 mL/min Final  . TSH 09/16/2015 3.58  0.35 - 4.50 uIU/mL Final  . Cholesterol 09/16/2015 182  0 - 200 mg/dL Final  . Triglycerides 09/16/2015 165.0* 0.0 - 149.0 mg/dL Final  . HDL 09/16/2015 31.30* >39.00 mg/dL Final  . VLDL 09/16/2015 33.0  0.0 - 40.0 mg/dL Final  . LDL Cholesterol 09/16/2015 118* 0 - 99 mg/dL Final  . Total CHOL/HDL Ratio 09/16/2015 6   Final  . NonHDL 09/16/2015 150.85   Final  . Fructosamine 09/17/2015 250  0 - 285 umol/L Final   Comment: Published reference interval for apparently healthy subjects between age 41 and 66 is 66 - 285 umol/L and in a poorly controlled diabetic population is 228 - 563 umol/L with a mean of 396 umol/L.         Medication List       Accurate as of 09/27/15  2:11 PM. Always use your most recent med list.          acetaminophen 325 MG tablet Commonly known as:  TYLENOL Take 1-2 tablets (325-650 mg total) by mouth every 4 (four) hours as needed for mild pain.   albuterol 108 (90 Base) MCG/ACT inhaler Commonly known as:  PROVENTIL HFA;VENTOLIN HFA Inhale 2 puffs into the lungs every 6 (six) hours as needed for wheezing or shortness of breath.   aspirin EC 81 MG tablet Take 81 mg by mouth daily.   clonazePAM 0.5 MG tablet Commonly known as:  KLONOPIN TAKE 1 & 1/2 TABLETS BY MOUTH AT BEDTIME   Fluticasone-Salmeterol 250-50 MCG/DOSE Aepb Commonly known as:  ADVAIR Inhale 1 puff into the lungs 2 (two) times daily.   furosemide 80 MG tablet Commonly known as:  LASIX TAKE 1.5 TABLETS BY MOUTH DAILY.   glimepiride 4 MG tablet Commonly known as:  AMARYL TAKE 1 TABLET BY MOUTH EVERY DAY IN THE MORNING AND 1/2 AT SUPPER   glucose blood test strip Commonly known as:  ONETOUCH VERIO Use as instructed to check blood sugar 2  times a day  Dx code E11.49   HUMALOG 100 UNIT/ML injection Generic drug:  insulin lispro USE MAX 56 UNITS PER DAY   WITH V-GO PUMP   ibuprofen 200 MG tablet Commonly known as:  ADVIL,MOTRIN Take 800 mg by mouth every 6 (six) hours as needed (For pain.).   levothyroxine 125 MCG tablet Commonly known as:  SYNTHROID, LEVOTHROID Take 1 tablet (125 mcg total) by mouth daily.   metFORMIN 1000 MG tablet Commonly known as:  GLUCOPHAGE TAKE 1 TABLET (1,000 MG TOTAL) BY MOUTH 2 (TWO) TIMES DAILY WITH A MEAL.   metoprolol succinate 50 MG 24 hr tablet Commonly known as:  TOPROL-XL TAKE 1 TABLET BY MOUTH DAILY. TAKE WITH OR IMMEDIATELY FOLLOWING A MEAL.   multivitamin with minerals Tabs tablet Take 1 tablet by mouth daily.   nitroGLYCERIN 0.4 MG SL tablet Commonly known as:  NITROSTAT Place 1 tablet (0.4 mg total) under the tongue every 5 (five) minutes as needed for chest pain.   NOVOTWIST 32G X 5 MM Misc Generic drug:  Insulin Pen Needle USE ONE PER DAY TO INJECT VICTOZA   ONETOUCH DELICA LANCETS 27P Misc Use to check blood sugar 2 times per day dx code E11.49   potassium chloride 10 MEQ tablet Commonly known as:  KLOR-CON M10 Take 3 tablets (30 mEq total) by mouth daily.   Pramipexole Dihydrochloride 1.5 MG Tb24 Commonly known as:  MIRAPEX ER Take 1 tablet (1.5 mg total) by mouth daily.   rosuvastatin 20 MG tablet Commonly known as:  CRESTOR Take 1 tablet (20 mg total) by mouth daily.   traMADol 50 MG tablet Commonly known as:  ULTRAM TAKE 1 TABLET BY MOUTH TWICE A DAY AS NEEDED   travoprost (benzalkonium) 0.004 % ophthalmic solution Commonly known as:  TRAVATAN Place 1 drop into the right eye at bedtime.   trihexyphenidyl 2 MG tablet Commonly known as:  ARTANE TAKE 1 TABLET TWICE A DAY   V-GO 20 Kit USE ONE PER DAY   VICTOZA 18 MG/3ML Sopn Generic drug:  Liraglutide INJECT 1.8MG ONCE DAILY   vitamin B-12 1000 MCG tablet Commonly known as:   CYANOCOBALAMIN Take 1,000 mcg by mouth daily.       Allergies:  Allergies  Allergen Reactions  . Penicillins Rash and Other (See Comments)    WEARS ALLERGY BRACELET Because of a history of documented adverse serious drug reaction;Medi Alert bracelet  is recommended Has patient had a PCN reaction causing immediate rash, facial/tongue/throat swelling, SOB or lightheadedness with hypotension: Yes Has patient had a PCN reaction causing severe rash involving mucus membranes or skin necrosis: unknown Has patient had a PCN reaction that required hospitalization NO Has patient had a PCN reaction occurring within the last 10 years: NO If all of     Past Medical History:  Diagnosis Date  . AICD (automatic cardioverter/defibrillator) present    Dr Lovena Le office visit yearly  . Arthritis    cane  . Asthma   . Benign neoplasm of colon   . CAD (coronary artery disease)   . Cardiomyopathy   . Complication of anesthesia    pt states that he got a rash  . Constipation   . 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 DM (diabetes mellitus) ([place])    TYPE 2   . GERD (gastroesophageal reflux disease)   . Glaucoma    right eye  . HLD (hyperlipidemia)   . HTN (hypertension)    pt denies 08/19/12  .  Hyperplasia, prostate   . Hyperthyroidism    thyroid lobectomy  . MI (myocardial infarction) [facility])    Dr Stanford Breed 2000, x3vessels bypass  . Nephrolithiasis   . OSA (obstructive sleep apnea)    AHI-28,on CPAP, noncompliant with CPAP  . Parkinson disease ([place])    1999  . PONV (postoperative nausea and vomiting)   . Restless legs   . Shortness of breath    Hx: of at all times  . Sleep apnea    does not use CPAP  . Sleep apnea, organic   . UTI (lower urinary tract infection) 09/15/12   Klebsiella  . Ventral hernia     Past Surgical History:  Procedure Laterality Date  . 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 CARDIAC CATHETERIZATION    . CATARACT  EXTRACTION W/ INTRAOCULAR LENS IMPLANT     Hx: of right eye  . COLONOSCOPY N/A 10/13/2014   Procedure: COLONOSCOPY;  Surgeon: Jerene Bears, MD;  Location: [facility] ENDOSCOPY;  Service: Gastroenterology;  Laterality: N/A;  . COLONOSCOPY W/ BIOPSIES AND POLYPECTOMY     Hx: of  . CORONARY ARTERY BYPASS GRAFT  2000   [name], MD  . [place]  . DEEP BRAIN STIMULATOR PLACEMENT  2004   Right and left VIN stimulator placement (parkinsons)  . EYE SURGERY    . FINGER AMPUTATION     left pointer  . IMPLANTABLE CARDIOVERTER DEFIBRILLATOR IMPLANT N/A 11/13/2013   Procedure: IMPLANTABLE CARDIOVERTER DEFIBRILLATOR IMPLANT;  Surgeon: Evans Lance, MD;  Location: [facility] CATH LAB;  Service: Cardiovascular;  Laterality: N/A;  . INSERT / REPLACE / REMOVE PACEMAKER    . LEFT AND RIGHT HEART CATHETERIZATION WITH CORONARY ANGIOGRAM N/A 09/24/2013   Procedure: LEFT AND RIGHT HEART CATHETERIZATION WITH CORONARY ANGIOGRAM;  Surgeon: Burnell Blanks, MD;  Location: [facility] C[facility] LAB;  Service: Cardiovascular;  Laterality: N/A;  . LITHOTRIPSY     3 different times  . MEDIAN STERNOTOMY  2000  . SUBTHALAMIC STIMULATOR BATTERY REPLACEMENT N/A 09/05/2012   Procedure: Deep brain stimulator battery change;  Surgeon: Erline Levine, MD;  Location: [facility] NEURO ORS;  Service: Neurosurgery;  Laterality: N/A;  Deep brain stimulator battery change  . SUBTHALAMIC STIMULATOR BATTERY REPLACEMENT N/A 06/10/2015   Procedure: Deep Brain stimulator battery change;  Surgeon: Erline Levine, MD;  Location: [facility]URO ORS;  Service: Neurosurgery;  Laterality: N/A;  . TONSILLECTOMY      Family History  Problem Relation Age of Onset  . Aneurysm Mother   . Alcoholism Father   . Autoimmune disease Brother 50    AIDS  . Peripheral vascular disease    . Arthritis    . Diabetes Neg Hx   . Heart disease Neg Hx   . Colon cancer Neg Hx   . Prostate cancer Neg Hx     Social History:  reports that he has never smoked. He has  never used smokeless tobacco. He reports that he drinks alcohol. He reports that he does not use drugs.    Review of Systems     Most recent eye exam was done in 2/17       Lipids: was on Lipitor 40 mg, now 72m; He apparently was having muscle aches and was noncompliant with his Lipitor. He was told by PCP to try 20 mg but is still having some aches with this, just started back a week ago He has an LDL target of 70 and has history  of CAD HDL also low with Lipitor      Lab Results  Component Value Date   CHOL 182 09/16/2015   HDL 31.30 (L) 09/16/2015   LDLCALC 118 (H) 09/16/2015   LDLDIRECT 86.0 05/19/2014   TRIG 165.0 (H) 09/16/2015   CHOLHDL 6 09/16/2015                  Thyroid:   He has had hypothyroidism following radioactive iodine treatment for hyperthyroidism in 2013  TSH is Again normal with  dosage of 125 g   Lab Results  Component Value Date   TSH 3.58 09/16/2015   TSH 2.06 12/15/2014   TSH 3.57 08/20/2014   FREET4 0.82 08/20/2014   FREET4 0.98 11/20/2011   FREET4 0.91 10/16/2011       The blood pressure has been followed by His other physicians, currently taking losartan and 25 mg Aldactone   Lab Results  Component Value Date   CREATININE 1.29 09/16/2015   BUN 20 09/16/2015   NA 143 09/16/2015   K 4.2 09/16/2015   CL 101 09/16/2015   CO2 35 (H) 09/16/2015     Diabetic foot exam in 10/16 showed  normal monofilament sensation in the toes except decreased on the right great toe and plantar surfaces, no skin lesions or ulcers on the feet and normal pedal pulses    Physical Examination:  BP (!) 142/72   Pulse 81   Temp 98 F (36.7 C)   Resp 16   Ht _0  (1.753 m)   Wt 270 lb 9.6 oz (122.7 kg)   SpO2 98%   BMI 39.96 kg/m      ASSESSMENT/PLAN  Diabetes type 2, uncontrolled with obesity    See history of present illness for detailed discussion of his current management, blood sugar patterns and problems identified  His blood sugars are  variably high And now not clear if some of these are related to an accurate results from his lower monitor Lab glucose was 137 late morning without food His wife thinks he is poorly compliant with diet especially sweets, fried food and fatty meats at times He does not appear motivated His wife thinks that he is somewhat confused He does think he fasting in the morning and his wife thinks these are related to consistent diet in the evenings and snacks late at night Also his sugars can be higher with meals when he is going off his diet and eating fried food He is usually not checking blood sugars consistently after his evening meal Also probably can do at least 2 units bolus for his late night snack  Since A1c is lower than expected will also check fructosamine on his next visit Discussed improving diet consistently, more blood sugars after meals, watching portions and trying to be as active as possible  HYPERLIPIDEMIA increased LDL and low HDL. He needs high intensity statin Cannot tolerate Lipitor 40 mg and probably not 20 mg Since he had taken Crestor previously without side effects he can go back to 20 mg  Crestor, this is now generic, previously had difficulties with brand name affordability To have lipids recheck on the next visit Discussed need for high intensity statin and LDL goals also   HYPERTENSION:  well controlled  Hypothyroidism:  Adequately replaced   Patient Instructions  Call if sugars stay high on new meter  Check blood sugars on waking up  3-4x per week  Also check blood sugars about 2 hours after a  meal and do this after different meals by rotation  Recommended blood sugar levels on waking up is 90-130 and about 2 hours after meal is 130-160  Please bring your blood sugar monitor to each visit, thank you  Less fatty foods and sweets     Counseling time on subjects discussed above is over 50% of today's 25 minute visit  Jenilee Franey 09/27/2015, 2:11 PM    Note: This office note was prepared with Dragon voice recognition system technology. Any transcriptional errors that result from this process are unintentional.

## 2015-09-29 ENCOUNTER — Ambulatory Visit (INDEPENDENT_AMBULATORY_CARE_PROVIDER_SITE_OTHER): Payer: Medicare Other | Admitting: Family Medicine

## 2015-09-29 ENCOUNTER — Encounter: Payer: Self-pay | Admitting: Family Medicine

## 2015-09-29 VITALS — BP 110/64 | HR 78 | Temp 98.4°F | Ht 69.25 in | Wt 272.0 lb

## 2015-09-29 DIAGNOSIS — I255 Ischemic cardiomyopathy: Secondary | ICD-10-CM | POA: Diagnosis not present

## 2015-09-29 DIAGNOSIS — S80812A Abrasion, left lower leg, initial encounter: Secondary | ICD-10-CM | POA: Diagnosis not present

## 2015-09-29 NOTE — Progress Notes (Signed)
Dr. Frederico Hamman T. Loletta Harper, MD, [facility] [place], [address] Phone: [phone] Fax: [phone]  09/29/2015  Patient: Henry [name], MRN: [id number], DOB: [birth year], 65 y.o.  Primary Physician:  Elsie Stain, MD   Chief Complaint  Patient presents with  . Sore on Legs    Scraped on [place] Boat   Subjective:   Henry [name] is a 65 y.o. very pleasant male patient who presents with the following:  Small scrapes on each leg.  There is one on each leg, and the one on the left side is less than 1 centimeter, and the one on the right is approximately 1 half centimeters across. There is a scab present on each one. There is no necrotic tissue.  Past Medical History, Surgical History, Social History, Family History, Problem List, Medications, and Allergies have been reviewed and updated if relevant.  Patient Active Problem List   Diagnosis Date Noted  . Medicare annual wellness visit, initial 09/23/2015  . Advance care planning 09/23/2015  . Spinal stenosis of lumbar region 01/26/2015  . Restless leg syndrome 08/11/2014  . ICD- MDT, implanted 11/13/13 11/14/2013  . Chronic systolic CHF (congestive heart failure) ([place]) 11/13/2013  . Cardiomyopathy, ischemic- EF 35-40% by echo 5/13, but 05/2013 EF now 25-30% 05/13/2013  . Acute on chronic combined systolic and diastolic congestive heart failure, NYHA class 4 ([place]) 05/12/2013  . 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 Elevated PSA 12/04/2012  . Other malaise and fatigue 12/04/2012  . PD (Parkinson's disease) ([place]) 08/19/2012  . OSA on CPAP 04/17/2012  . Dysautonomia 04/17/2012  . Obesity, morbid ([place]) 04/17/2012  . Hypothyroidism following radioiodine therapy 10/16/2011  . DYSPNEA/SHORTNESS OF BREATH 11/09/2009  . GERD 05/11/2009  . Poorly controlled type 2 diabetes mellitus with circulatory disorder ([place]) 04/26/2009  . OTHER SPEC FORMS CHRONIC ISCHEMIC HEART  DISEASE 04/24/2008  . HYPERPLASIA PROSTATE UNS W/O UR OBST & OTH LUTS 09/02/2007  . NEPHROLITHIASIS, HX OF 09/02/2007  . HERNIA, VENTRAL 12/28/2006  . Hyperlipidemia 08/31/2006  . HTN (hypertension) 08/31/2006  . CAD- s/p CABG x 3 2000- 08/31/2006    Past Medical History:  Diagnosis Date  . AICD (automatic cardioverter/defibrillator) present    Dr Lovena Le office visit yearly  . Arthritis    cane  . Asthma   . Benign neoplasm of colon   . CAD (coronary artery disease)   . Cardiomyopathy   . Complication of anesthesia    pt states that he got a rash  . Constipation   . 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 DM (diabetes mellitus) ([place])    TYPE 2   . GERD (gastroesophageal reflux disease)   . Glaucoma    right eye  . HLD (hyperlipidemia)   . HTN (hypertension)    pt denies 08/19/12  . Hyperplasia, prostate   . Hyperthyroidism    thyroid lobectomy  . MI (myocardial infarction) [facility])    Dr Stanford Breed 2000, x3vessels bypass  . Nephrolithiasis   . OSA (obstructive sleep apnea)    AHI-28,on CPAP, noncompliant with CPAP  . Parkinson disease ([place])    1999  . PONV (postoperative nausea and vomiting)   . Restless legs   . Shortness of breath    Hx: of at all times  . Sleep apnea    does not use CPAP  . Sleep apnea, organic   . UTI (lower urinary tract infection) 09/15/12  Klebsiella  . Ventral hernia     Past Surgical History:  Procedure Laterality Date  . 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 CARDIAC CATHETERIZATION    . CATARACT EXTRACTION W/ INTRAOCULAR LENS IMPLANT     Hx: of right eye  . COLONOSCOPY N/A 10/13/2014   Procedure: COLONOSCOPY;  Surgeon: Jerene Bears, MD;  Location: [facility] ENDOSCOPY;  Service: Gastroenterology;  Laterality: N/A;  . COLONOSCOPY W/ BIOPSIES AND POLYPECTOMY     Hx: of  . CORONARY ARTERY BYPASS GRAFT  2000   [name], MD  . [place]  . DEEP BRAIN STIMULATOR PLACEMENT  2004    Right and left VIN stimulator placement (parkinsons)  . EYE SURGERY    . FINGER AMPUTATION     left pointer  . IMPLANTABLE CARDIOVERTER DEFIBRILLATOR IMPLANT N/A 11/13/2013   Procedure: IMPLANTABLE CARDIOVERTER DEFIBRILLATOR IMPLANT;  Surgeon: Evans Lance, MD;  Location: [facility] C[facility] LAB;  Service: Cardiovascular;  Laterality: N/A;  . INSERT / REPLACE / REMOVE PACEMAKER    . LEFT AND RIGHT HEART CATHETERIZATION WITH CORONARY ANGIOGRAM N/A 09/24/2013   Procedure: LEFT AND RIGHT HEART CATHETERIZATION WITH CORONARY ANGIOGRAM;  Surgeon: Burnell Blanks, MD;  Location: [facility] CATH LAB;  Service: Cardiovascular;  Laterality: N/A;  . LITHOTRIPSY     3 different times  . MEDIAN STERNOTOMY  2000  . SUBTHALAMIC STIMULATOR BATTERY REPLACEMENT N/A 09/05/2012   Procedure: Deep brain stimulator battery change;  Surgeon: Erline Levine, MD;  Location: [facility] NEURO ORS;  Service: Neurosurgery;  Laterality: N/A;  Deep brain stimulator battery change  . SUBTHALAMIC STIMULATOR BATTERY REPLACEMENT N/A 06/10/2015   Procedure: Deep Brain stimulator battery change;  Surgeon: Erline Levine, MD;  Location: [facility] NEURO ORS;  Service: Neurosurgery;  Laterality: N/A;  . TONSILLECTOMY      Social History   Social History  . Marital status: Married    Spouse name: [name]  . Number of children: 2  . Years of education: N/A   Occupational History  . 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 Smoking status: Never Smoker  . Smokeless tobacco: Never Used  . Alcohol use Yes     Comment: occasional beer  . Drug use: No  . Sexual activity: Not on file   Other Topics Concern  . Not on file   Social History Narrative   From [facility]   Retired/disability Clinical research associate   Likes to fish.     Married 1972   3 kids   [place] fan    Family History  Problem Relation Age of Onset  . Aneurysm Mother   . Alcoholism Father   . Autoimmune disease Brother 50    AIDS  . Peripheral  vascular disease    . Arthritis    . Diabetes Neg Hx   . Heart disease Neg Hx   . Colon cancer Neg Hx   . Prostate cancer Neg Hx     Allergies  Allergen Reactions  . Penicillins Rash and Other (See Comments)    WEARS ALLERGY BRACELET Because of a history of documented adverse serious drug reaction;Medi Alert bracelet  is recommended Has patient had a PCN reaction causing immediate rash, facial/tongue/throat swelling, SOB or lightheadedness with hypotension: Yes Has patient had a PCN reaction causing severe rash involving mucus membranes or skin necrosis: unknown Has patient had a PCN reaction that required hospitalization NO Has patient had  a PCN reaction occurring within the last 10 years: NO If all of     Medication list reviewed and updated in full in [place].   GEN: No acute illnesses, no fevers, chills. GI: No n/v/d, eating normally Pulm: No SOB Interactive and getting along well at home.  Otherwise, ROS is as per the HPI.  Objective:   BP 110/64   Pulse 78   Temp 98.4 F (36.9 C) (Oral)   Ht 5' 9.25" (1.759 m)   Wt 272 lb (123.4 kg)   BMI 39.88 kg/m   GEN: WDWN, NAD, Non-toxic, A & O x 3 HEENT: Atraumatic, Normocephalic. Neck supple. No masses, No LAD. Ears and Nose: No external deformity. EXTR: No c/c/tr edema. 2 small healing scabs with good surrounding tissue, no warmth or redness. On the left, less than 1 centimeter, and on the right approximately 1 half centimeters across. NEURO Normal gait.  PSYCH: Normally interactive. Conversant. Not depressed or anxious appearing.  Calm demeanor.   Laboratory and Imaging Data:  Assessment and Plan:   Scratch of lower leg, left, initial encounter  Healing well, no sign of infection or necrotic tissue. This point I would only follow. Multiple risk factors, he and his wife will follow this visually every day.  Follow-up: No Follow-up on file.  Signed,  Maud Deed. Evanthia Maund, MD   Patient's Medications    New Prescriptions   No medications on file  Previous Medications   ACETAMINOPHEN (TYLENOL) 325 MG TABLET    Take 1-2 tablets (325-650 mg total) by mouth every 4 (four) hours as needed for mild pain.   ALBUTEROL (PROVENTIL HFA;VENTOLIN HFA) 108 (90 BASE) MCG/ACT INHALER    Inhale 2 puffs into the lungs every 6 (six) hours as needed for wheezing or shortness of breath.   ASPIRIN EC 81 MG TABLET    Take 81 mg by mouth daily.   CLONAZEPAM (KLONOPIN) 0.5 MG TABLET    TAKE 1 & 1/2 TABLETS BY MOUTH AT BEDTIME   FLUTICASONE-SALMETEROL (ADVAIR) 250-50 MCG/DOSE AEPB    Inhale 1 puff into the lungs 2 (two) times daily.   FUROSEMIDE (LASIX) 80 MG TABLET    TAKE 1.5 TABLETS BY MOUTH DAILY.   GLIMEPIRIDE (AMARYL) 4 MG TABLET    TAKE 1 TABLET BY MOUTH EVERY DAY IN THE MORNING AND 1/2 AT SUPPER   GLUCOSE BLOOD (ONETOUCH VERIO) TEST STRIP    Use as instructed to check blood sugar 2 times a day Dx code E11.49   HUMALOG 100 UNIT/ML INJECTION    USE MAX 56 UNITS PER DAY   WITH V-GO PUMP   IBUPROFEN (ADVIL,MOTRIN) 200 MG TABLET    Take 800 mg by mouth every 6 (six) hours as needed (For pain.).    INSULIN DISPOSABLE PUMP (V-GO 20) KIT    USE ONE PER DAY   LEVOTHYROXINE (SYNTHROID, LEVOTHROID) 125 MCG TABLET    Take 1 tablet (125 mcg total) by mouth daily.   METFORMIN (GLUCOPHAGE) 1000 MG TABLET    TAKE 1 TABLET (1,000 MG TOTAL) BY MOUTH 2 (TWO) TIMES DAILY WITH A MEAL.   METOPROLOL SUCCINATE (TOPROL-XL) 50 MG 24 HR TABLET    TAKE 1 TABLET BY MOUTH DAILY. TAKE WITH OR IMMEDIATELY FOLLOWING A MEAL.   MULTIPLE VITAMIN (MULTIVITAMIN WITH MINERALS) TABS TABLET    Take 1 tablet by mouth daily.    NITROGLYCERIN (NITROSTAT) 0.4 MG SL TABLET    Place 1 tablet (0.4 mg total) under the tongue every 5 (  five) minutes as needed for chest pain.   NOVOTWIST 32G X 5 MM MISC    USE ONE PER DAY TO INJECT VICTOZA   ONETOUCH DELICA LANCETS 74V MISC    Use to check blood sugar 2 times per day dx code E11.49   POTASSIUM CHLORIDE  (KLOR-CON M10) 10 MEQ TABLET    Take 3 tablets (30 mEq total) by mouth daily.   PRAMIPEXOLE DIHYDROCHLORIDE (MIRAPEX ER) 1.5 MG TB24    Take 1 tablet (1.5 mg total) by mouth daily.   ROSUVASTATIN (CRESTOR) 20 MG TABLET    Take 1 tablet (20 mg total) by mouth daily.   TRAMADOL (ULTRAM) 50 MG TABLET    TAKE 1 TABLET BY MOUTH TWICE A DAY AS NEEDED   TRAVOPROST, BENZALKONIUM, (TRAVATAN) 0.004 % OPHTHALMIC SOLUTION    Place 1 drop into the right eye at bedtime.    TRIHEXYPHENIDYL (ARTANE) 2 MG TABLET    TAKE 1 TABLET TWICE A DAY   VICTOZA 18 MG/3ML SOPN    INJECT 1.'8MG'$  ONCE DAILY   VITAMIN B-12 (CYANOCOBALAMIN) 1000 MCG TABLET    Take 1,000 mcg by mouth daily.  Modified Medications   No medications on file  Discontinued Medications   No medications on file

## 2015-09-29 NOTE — Progress Notes (Signed)
Pre visit review using our clinic review tool, if applicable. No additional management support is needed unless otherwise documented below in the visit note. 

## 2015-10-05 DIAGNOSIS — R296 Repeated falls: Secondary | ICD-10-CM | POA: Diagnosis not present

## 2015-10-05 DIAGNOSIS — M545 Low back pain: Secondary | ICD-10-CM | POA: Diagnosis not present

## 2015-10-05 DIAGNOSIS — R2689 Other abnormalities of gait and mobility: Secondary | ICD-10-CM | POA: Diagnosis not present

## 2015-10-05 DIAGNOSIS — M6281 Muscle weakness (generalized): Secondary | ICD-10-CM | POA: Diagnosis not present

## 2015-10-07 DIAGNOSIS — R2689 Other abnormalities of gait and mobility: Secondary | ICD-10-CM | POA: Diagnosis not present

## 2015-10-07 DIAGNOSIS — M545 Low back pain: Secondary | ICD-10-CM | POA: Diagnosis not present

## 2015-10-07 DIAGNOSIS — R296 Repeated falls: Secondary | ICD-10-CM | POA: Diagnosis not present

## 2015-10-07 DIAGNOSIS — M6281 Muscle weakness (generalized): Secondary | ICD-10-CM | POA: Diagnosis not present

## 2015-10-12 DIAGNOSIS — M6281 Muscle weakness (generalized): Secondary | ICD-10-CM | POA: Diagnosis not present

## 2015-10-12 DIAGNOSIS — M47817 Spondylosis without myelopathy or radiculopathy, lumbosacral region: Secondary | ICD-10-CM | POA: Diagnosis not present

## 2015-10-12 DIAGNOSIS — M545 Low back pain: Secondary | ICD-10-CM | POA: Diagnosis not present

## 2015-10-12 DIAGNOSIS — M48061 Spinal stenosis, lumbar region without neurogenic claudication: Secondary | ICD-10-CM | POA: Diagnosis not present

## 2015-10-12 DIAGNOSIS — R296 Repeated falls: Secondary | ICD-10-CM | POA: Diagnosis not present

## 2015-10-12 DIAGNOSIS — R2689 Other abnormalities of gait and mobility: Secondary | ICD-10-CM | POA: Diagnosis not present

## 2015-10-15 DIAGNOSIS — R296 Repeated falls: Secondary | ICD-10-CM | POA: Diagnosis not present

## 2015-10-15 DIAGNOSIS — M6281 Muscle weakness (generalized): Secondary | ICD-10-CM | POA: Diagnosis not present

## 2015-10-15 DIAGNOSIS — R2689 Other abnormalities of gait and mobility: Secondary | ICD-10-CM | POA: Diagnosis not present

## 2015-10-15 DIAGNOSIS — M545 Low back pain: Secondary | ICD-10-CM | POA: Diagnosis not present

## 2015-10-19 DIAGNOSIS — M545 Low back pain: Secondary | ICD-10-CM | POA: Diagnosis not present

## 2015-10-19 DIAGNOSIS — R2689 Other abnormalities of gait and mobility: Secondary | ICD-10-CM | POA: Diagnosis not present

## 2015-10-19 DIAGNOSIS — R296 Repeated falls: Secondary | ICD-10-CM | POA: Diagnosis not present

## 2015-10-19 DIAGNOSIS — M6281 Muscle weakness (generalized): Secondary | ICD-10-CM | POA: Diagnosis not present

## 2015-10-21 ENCOUNTER — Other Ambulatory Visit: Payer: Self-pay

## 2015-10-21 DIAGNOSIS — R296 Repeated falls: Secondary | ICD-10-CM | POA: Diagnosis not present

## 2015-10-21 DIAGNOSIS — R2689 Other abnormalities of gait and mobility: Secondary | ICD-10-CM | POA: Diagnosis not present

## 2015-10-21 DIAGNOSIS — M6281 Muscle weakness (generalized): Secondary | ICD-10-CM | POA: Diagnosis not present

## 2015-10-21 DIAGNOSIS — M545 Low back pain: Secondary | ICD-10-CM | POA: Diagnosis not present

## 2015-10-21 MED ORDER — ROSUVASTATIN CALCIUM 20 MG PO TABS: 20.0000 mg | ORAL_TABLET | Freq: Every day | ORAL | 3 refills | Status: DC

## 2015-10-26 ENCOUNTER — Ambulatory Visit (INDEPENDENT_AMBULATORY_CARE_PROVIDER_SITE_OTHER): Payer: Medicare Other | Admitting: *Deleted

## 2015-10-26 DIAGNOSIS — M6281 Muscle weakness (generalized): Secondary | ICD-10-CM | POA: Diagnosis not present

## 2015-10-26 DIAGNOSIS — I5022 Chronic systolic (congestive) heart failure: Secondary | ICD-10-CM

## 2015-10-26 DIAGNOSIS — R2689 Other abnormalities of gait and mobility: Secondary | ICD-10-CM | POA: Diagnosis not present

## 2015-10-26 DIAGNOSIS — I255 Ischemic cardiomyopathy: Secondary | ICD-10-CM | POA: Diagnosis not present

## 2015-10-26 DIAGNOSIS — M545 Low back pain: Secondary | ICD-10-CM | POA: Diagnosis not present

## 2015-10-26 DIAGNOSIS — R296 Repeated falls: Secondary | ICD-10-CM | POA: Diagnosis not present

## 2015-10-26 NOTE — Progress Notes (Signed)
Remote ICD transmission.   

## 2015-10-27 ENCOUNTER — Encounter: Payer: Self-pay | Admitting: Cardiology

## 2015-10-27 ENCOUNTER — Other Ambulatory Visit: Payer: Self-pay | Admitting: Family Medicine

## 2015-10-28 DIAGNOSIS — R296 Repeated falls: Secondary | ICD-10-CM | POA: Diagnosis not present

## 2015-10-28 DIAGNOSIS — M545 Low back pain: Secondary | ICD-10-CM | POA: Diagnosis not present

## 2015-10-28 DIAGNOSIS — M6281 Muscle weakness (generalized): Secondary | ICD-10-CM | POA: Diagnosis not present

## 2015-10-28 DIAGNOSIS — R2689 Other abnormalities of gait and mobility: Secondary | ICD-10-CM | POA: Diagnosis not present

## 2015-10-31 IMAGING — CR DG CHEST 1V PORT
1 series · 1 of 1 positions shown · non-contrast
Comparison: DG CHEST 2 VIEW dated 09/14/2012

CLINICAL DATA: Chest pain.  Short of breath.

EXAM:
PORTABLE CHEST - 1 VIEW

[AP]
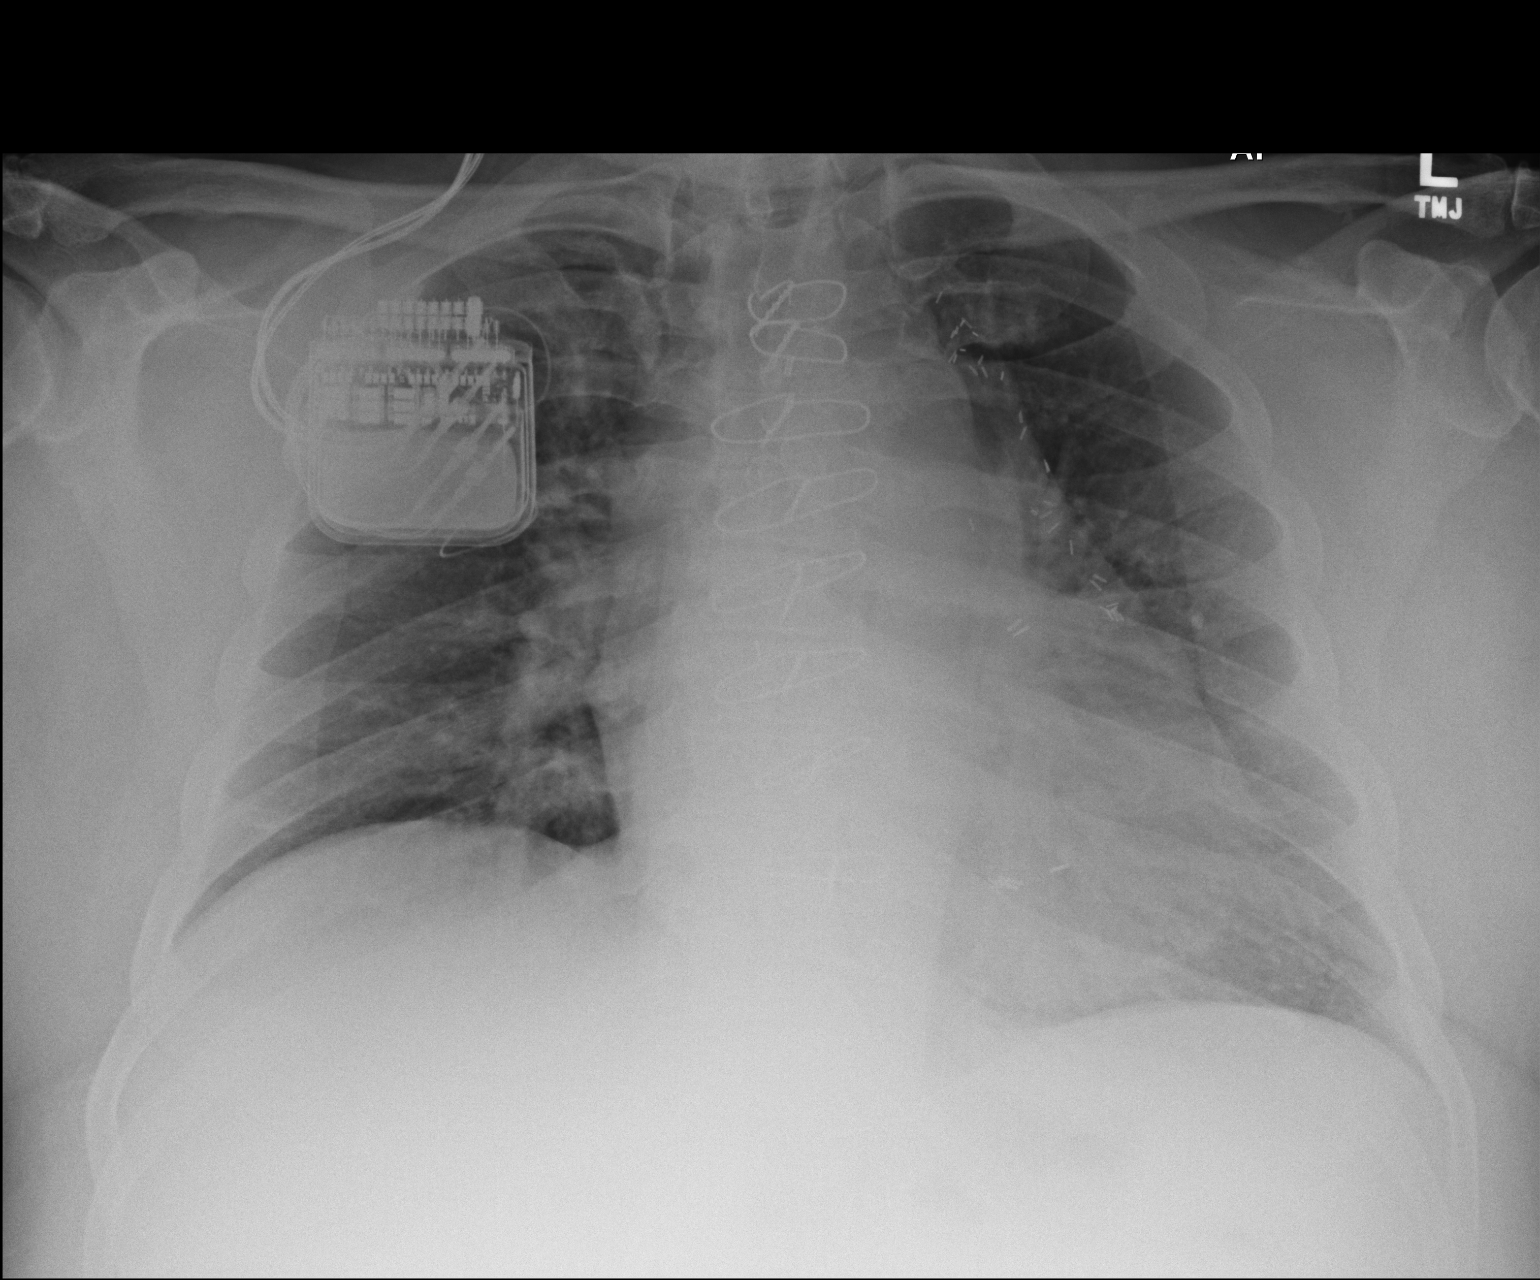

[1 of 1 positions shown; findings below may reference images not displayed]

FINDINGS: Power pack projects over the right chest. Median sternotomy/ CABG.
Mild apical lordotic projection accentuates the cardiopericardial
silhouette. There is no failure. No pleural effusion. No pulmonary
edema.
IMPRESSION: Apical lordotic projection. CABG. No acute cardiopulmonary disease.

## 2015-11-02 DIAGNOSIS — M6281 Muscle weakness (generalized): Secondary | ICD-10-CM | POA: Diagnosis not present

## 2015-11-02 DIAGNOSIS — R296 Repeated falls: Secondary | ICD-10-CM | POA: Diagnosis not present

## 2015-11-02 DIAGNOSIS — R2689 Other abnormalities of gait and mobility: Secondary | ICD-10-CM | POA: Diagnosis not present

## 2015-11-02 DIAGNOSIS — M545 Low back pain: Secondary | ICD-10-CM | POA: Diagnosis not present

## 2015-11-04 ENCOUNTER — Encounter: Payer: Self-pay | Admitting: Cardiology

## 2015-11-04 ENCOUNTER — Ambulatory Visit (INDEPENDENT_AMBULATORY_CARE_PROVIDER_SITE_OTHER): Payer: Medicare Other | Admitting: Cardiology

## 2015-11-04 VITALS — BP 126/68 | HR 90 | Ht 69.0 in | Wt 268.0 lb

## 2015-11-04 DIAGNOSIS — Z95810 Presence of automatic (implantable) cardiac defibrillator: Secondary | ICD-10-CM

## 2015-11-04 DIAGNOSIS — I5022 Chronic systolic (congestive) heart failure: Secondary | ICD-10-CM

## 2015-11-04 DIAGNOSIS — M545 Low back pain: Secondary | ICD-10-CM | POA: Diagnosis not present

## 2015-11-04 DIAGNOSIS — I255 Ischemic cardiomyopathy: Secondary | ICD-10-CM

## 2015-11-04 DIAGNOSIS — R296 Repeated falls: Secondary | ICD-10-CM | POA: Diagnosis not present

## 2015-11-04 DIAGNOSIS — I5043 Acute on chronic combined systolic (congestive) and diastolic (congestive) heart failure: Secondary | ICD-10-CM

## 2015-11-04 DIAGNOSIS — M6281 Muscle weakness (generalized): Secondary | ICD-10-CM | POA: Diagnosis not present

## 2015-11-04 DIAGNOSIS — G20 Parkinson's disease: Secondary | ICD-10-CM | POA: Diagnosis not present

## 2015-11-04 DIAGNOSIS — R2689 Other abnormalities of gait and mobility: Secondary | ICD-10-CM | POA: Diagnosis not present

## 2015-11-04 MED ORDER — METOLAZONE 2.5 MG PO TABS: ORAL_TABLET | ORAL | 3 refills | Status: DC

## 2015-11-04 NOTE — Assessment & Plan Note (Signed)
Last EF 30-35% by echo Aug 2015

## 2015-11-04 NOTE — Patient Instructions (Signed)
Medication Instructions:  START Metolazone 2.5mg  Take 1 tab daily for 2 days, Starting on Monday Take 1 tab Monday, Wednesday, Friday  Labwork: Your physician recommends that you return for lab work in:  2 weeks complete BNP and BMP.  Testing/Procedures: None   Follow-Up: Your physician recommends that you schedule a follow-up appointment in: 2 weeks with Kerin Ransom, PA  Any Other Special Instructions Will Be Listed Below (If Applicable).     If you need a refill on your cardiac medications before your next appointment, please call your pharmacy.

## 2015-11-04 NOTE — Assessment & Plan Note (Signed)
2007 Deep brain stimulator ; Dr Vertell Limber S/P battery change 2014 and June 2017

## 2015-11-04 NOTE — Assessment & Plan Note (Signed)
Followed by Dr Lovena Le, this has never gone off according to the pt

## 2015-11-04 NOTE — Assessment & Plan Note (Signed)
Pt here with complaints of increased DOE. He notes he was taken off Aldactone 4 weeks ago by his pharmacist secondary to drug interaction

## 2015-11-04 NOTE — Assessment & Plan Note (Signed)
Patent grafts at cath Sept 2015

## 2015-11-04 NOTE — Progress Notes (Signed)
11/04/2015 Henry [name]   1950/09/07  [id number]  Primary Physician Elsie Stain, MD Primary Cardiologist: Dr Stanford Breed Dr Lovena Le  HPI:  65 y/o obese male with a history of coronary artery disease followed  By Dr Stanford Breed. 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 He has a history of Parkinson's- (s/p subthalamic stimulator), diabetes, hypertension and hyperlipidemia.  Echo 8/15 showed an EF of 30-35 with moderate LAE. Cardiac catheterization September 2015 showed a pulmonary Wedge pressure of 16 and PA pressure 32/20. The LAD was occluded. No obstructive disease in the circumflex. The right coronary had severe diffuse distal disease. The SVG to the PDA was patent, the SVG to diagonal was patent, and the LIMA to the mid LAD was patent. Medical therapy recommended. He had a MDT ICD placed November 2015. He is in the office today with complaints gradually increasing DOE. He initially told me he "couldn't sleep when laying down" but his wife confirms he has been sleep in a recliner for a long time, this is nothing new. He did say his pharmacist took him off his Aldactone 4 weeks ago secondary to drug interaction with his Parkinson's medication.    Current Outpatient Prescriptions  Medication Sig Dispense Refill  . acetaminophen (TYLENOL) 325 MG tablet Take 1-2 tablets (325-650 mg total) by mouth every 4 (four) hours as needed for mild pain.    Marland Kitchen albuterol (PROVENTIL HFA;VENTOLIN HFA) 108 (90 Base) MCG/ACT inhaler Inhale 2 puffs into the lungs every 6 (six) hours as needed for wheezing or shortness of breath. 18 g 5  . aspirin EC 81 MG tablet Take 81 mg by mouth daily.    . clonazePAM (KLONOPIN) 0.5 MG tablet TAKE 1 & 1/2 TABLETS BY MOUTH AT BEDTIME 135 tablet 1  . Fluticasone-Salmeterol (ADVAIR) 250-50 MCG/DOSE AEPB Inhale 1 puff into the lungs 2 (two) times daily. 60 each 5  . furosemide (LASIX) 80 MG tablet TAKE 1.5 TABLETS BY MOUTH DAILY. (Patient taking differently: Take 120 mg by mouth daily. ) 135  tablet 1  . glimepiride (AMARYL) 4 MG tablet TAKE 1 TABLET BY MOUTH EVERY DAY IN THE MORNING AND 1/2 AT SUPPER (Patient taking differently: Take 2-4 mg by mouth 2 (two) times daily. TAKE 1 TABLET BY MOUTH EVERY DAY IN THE MORNING AND 1/2 AT SUPPER) 135 tablet 1  . glucose blood (ONETOUCH VERIO) test strip Use as instructed to check blood sugar 2 times a day Dx code E11.49 100 each 5  . HUMALOG 100 UNIT/ML injection USE MAX 56 UNITS PER DAY   WITH V-GO PUMP 60 mL 1  . ibuprofen (ADVIL,MOTRIN) 200 MG tablet Take 800 mg by mouth every 6 (six) hours as needed (For pain.).     . Insulin Disposable Pump (V-GO 20) KIT USE ONE PER DAY 1 kit 5  . KLOR-CON M10 10 MEQ tablet TAKE 3 TABLETS (30 MEQ TOTAL) BY MOUTH DAILY. 90 tablet 2  . levothyroxine (SYNTHROID, LEVOTHROID) 125 MCG tablet Take 1 tablet (125 mcg total) by mouth daily.    . metFORMIN (GLUCOPHAGE) 1000 MG tablet TAKE 1 TABLET (1,000 MG TOTAL) BY MOUTH 2 (TWO) TIMES DAILY WITH A MEAL. 180 tablet 3  . metoprolol succinate (TOPROL-XL) 50 MG 24 hr tablet TAKE 1 TABLET BY MOUTH DAILY. TAKE WITH OR IMMEDIATELY FOLLOWING A MEAL. 30 tablet 9  . Multiple Vitamin (MULTIVITAMIN WITH MINERALS) TABS tablet Take 1 tablet by mouth daily.     Marland Kitchen NOVOTWIST 32G X 5 MM MISC USE ONE  PER DAY TO INJECT VICTOZA 100 each 2  . ONETOUCH DELICA LANCETS 48N MISC Use to check blood sugar 2 times per day dx code E11.49 100 each 5  . Pramipexole Dihydrochloride (MIRAPEX ER) 1.5 MG TB24 Take 1 tablet (1.5 mg total) by mouth daily. 30 tablet 4  . rosuvastatin (CRESTOR) 20 MG tablet Take 1 tablet (20 mg total) by mouth daily. 90 tablet 3  . traMADol (ULTRAM) 50 MG tablet TAKE 1 TABLET BY MOUTH TWICE A DAY AS NEEDED 60 tablet 3  . travoprost, benzalkonium, (TRAVATAN) 0.004 % ophthalmic solution Place 1 drop into the right eye at bedtime.     . trihexyphenidyl (ARTANE) 2 MG tablet TAKE 1 TABLET TWICE A DAY 180 tablet 1  . VICTOZA 18 MG/3ML SOPN INJECT 1.'8MG'$  ONCE DAILY 27 mL 1  .  vitamin B-12 (CYANOCOBALAMIN) 1000 MCG tablet Take 1,000 mcg by mouth daily.    . metolazone (ZAROXOLYN) 2.5 MG tablet Take 1 tablet a day for 2 days, Starting Monday take 1 tab Monday Wednesday and Friday; take 30 minutes before taking Lasix 30 tablet 3  . nitroGLYCERIN (NITROSTAT) 0.4 MG SL tablet Place 1 tablet (0.4 mg total) under the tongue every 5 (five) minutes as needed for chest pain. 25 tablet 2   No current facility-administered medications for this visit.     Allergies  Allergen Reactions  . Penicillins Rash and Other (See Comments)    WEARS ALLERGY BRACELET Because of a history of documented adverse serious drug reaction;Medi Alert bracelet  is recommended Has patient had a PCN reaction causing immediate rash, facial/tongue/throat swelling, SOB or lightheadedness with hypotension: Yes Has patient had a PCN reaction causing severe rash involving mucus membranes or skin necrosis: unknown Has patient had a PCN reaction that required hospitalization NO Has patient had a PCN reaction occurring within the last 10 years: NO If all of     Social History   Social History  . Marital status: Married    Spouse name: [name]  . Number of children: 2  . Years of education: N/A   Occupational History  . 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 Smoking status: Never Smoker  . Smokeless tobacco: Never Used  . Alcohol use Yes     Comment: occasional beer  . Drug use: No  . Sexual activity: Not on file   Other Topics Concern  . Not on file   Social History Narrative   From [facility]   Retired/disability Clinical research associate   Likes to fish.     Married 1972   3 kids   [place] fan     Review of Systems: General: negative for chills, fever, night sweats or weight changes.  Cardiovascular: negative for chest pain, dyspnea on exertion, edema, orthopnea, palpitations, paroxysmal nocturnal dyspnea or shortness of breath Dermatological:  negative for rash Respiratory: negative for cough or wheezing Urologic: negative for hematuria Abdominal: negative for nausea, vomiting, diarrhea, bright red blood per rectum, melena, or hematemesis Neurologic: negative for visual changes, syncope, or dizziness All other systems reviewed and are otherwise negative except as noted above.    Blood pressure 126/68, pulse 90, height '5\' 9"'$  (1.753 m), weight 268 lb (121.6 kg).  General appearance: alert, cooperative, no distress and morbidly obese Neck: no carotid bruit Lungs: clear to auscultation bilaterally Heart: regular rate and rhythm Extremities: 1+ chronic rudy edema Skin: Skin color, texture, turgor normal. No rashes or lesions Neurologic:  Grossly normal   ASSESSMENT AND PLAN:   Acute on chronic combined systolic and diastolic congestive heart failure, NYHA class 4 Pt here with complaints of increased DOE. He notes he was taken off Aldactone 4 weeks ago by his pharmacist secondary to drug interaction  Chronic systolic CHF (congestive heart failure) Last EF 30-35% by echo Aug 2015  ICD- MDT, implanted 11/13/13 Followed by Dr Lovena Le, this has never gone off according to the pt  Hx of CABG x 3 in 2000 Patent grafts at cath Sept 2015  PD (Parkinson's disease) 2007 Deep brain stimulator ; Dr Vertell Limber S/P battery change 2014 and June 2017    PLAN  I believe Mr Okray is volume overloaded. His wife says he "drinks lots of water", and he was taken off Aldactone. I suggested he only drink when thirsty and I added Zaroxolyn 2.5 mg QD x 2 followed by 2.5 MG MWF. I'll see him back in a few weeks. He'll get a BMP and BNP prior to that visit.   Kerin Ransom PA-C 11/04/2015 4:18 PM

## 2015-11-05 LAB — CUP PACEART REMOTE DEVICE CHECK
Battery Remaining Longevity: 117 mo
Battery Voltage: 3 V
Brady Statistic AP VP Percent: 0.01 %
Brady Statistic AS VP Percent: 0.04 %
Brady Statistic RV Percent Paced: 0.04 %
Date Time Interrogation Session: 20171017133424
HIGH POWER IMPEDANCE MEASURED VALUE: 69 Ohm
Implantable Lead Implant Date: 20151105
Implantable Lead Location: 753859
Lead Channel Impedance Value: 399 Ohm
Lead Channel Pacing Threshold Amplitude: 0.5 V
Lead Channel Pacing Threshold Amplitude: 1.125 V
Lead Channel Sensing Intrinsic Amplitude: 18.75 mV
Lead Channel Sensing Intrinsic Amplitude: 18.75 mV
Lead Channel Setting Pacing Amplitude: 2.25 V
Lead Channel Setting Pacing Amplitude: 2.5 V
Lead Channel Setting Pacing Pulse Width: 0.4 ms
Lead Channel Setting Sensing Sensitivity: 0.3 mV
MDC IDC LEAD IMPLANT DT: 20151105
MDC IDC LEAD LOCATION: 753860
MDC IDC MSMT LEADCHNL RA IMPEDANCE VALUE: 437 Ohm
MDC IDC MSMT LEADCHNL RA PACING THRESHOLD PULSEWIDTH: 0.4 ms
MDC IDC MSMT LEADCHNL RA SENSING INTR AMPL: 2.125 mV
MDC IDC MSMT LEADCHNL RA SENSING INTR AMPL: 2.125 mV
MDC IDC MSMT LEADCHNL RV IMPEDANCE VALUE: 494 Ohm
MDC IDC MSMT LEADCHNL RV PACING THRESHOLD PULSEWIDTH: 0.4 ms
MDC IDC STAT BRADY AP VS PERCENT: 2.79 %
MDC IDC STAT BRADY AS VS PERCENT: 97.17 %
MDC IDC STAT BRADY RA PERCENT PACED: 2.79 %

## 2015-11-09 DIAGNOSIS — M6281 Muscle weakness (generalized): Secondary | ICD-10-CM | POA: Diagnosis not present

## 2015-11-09 DIAGNOSIS — R296 Repeated falls: Secondary | ICD-10-CM | POA: Diagnosis not present

## 2015-11-09 DIAGNOSIS — R2689 Other abnormalities of gait and mobility: Secondary | ICD-10-CM | POA: Diagnosis not present

## 2015-11-09 DIAGNOSIS — M545 Low back pain: Secondary | ICD-10-CM | POA: Diagnosis not present

## 2015-11-09 IMAGING — CR DG CHEST 2V
3 series · 3 of 3 positions shown · non-contrast
Comparison: September 14, 2012.

CLINICAL DATA: Cardiomyopathy.

EXAM:
CHEST  2 VIEW

[view not recorded (1 of 3)]
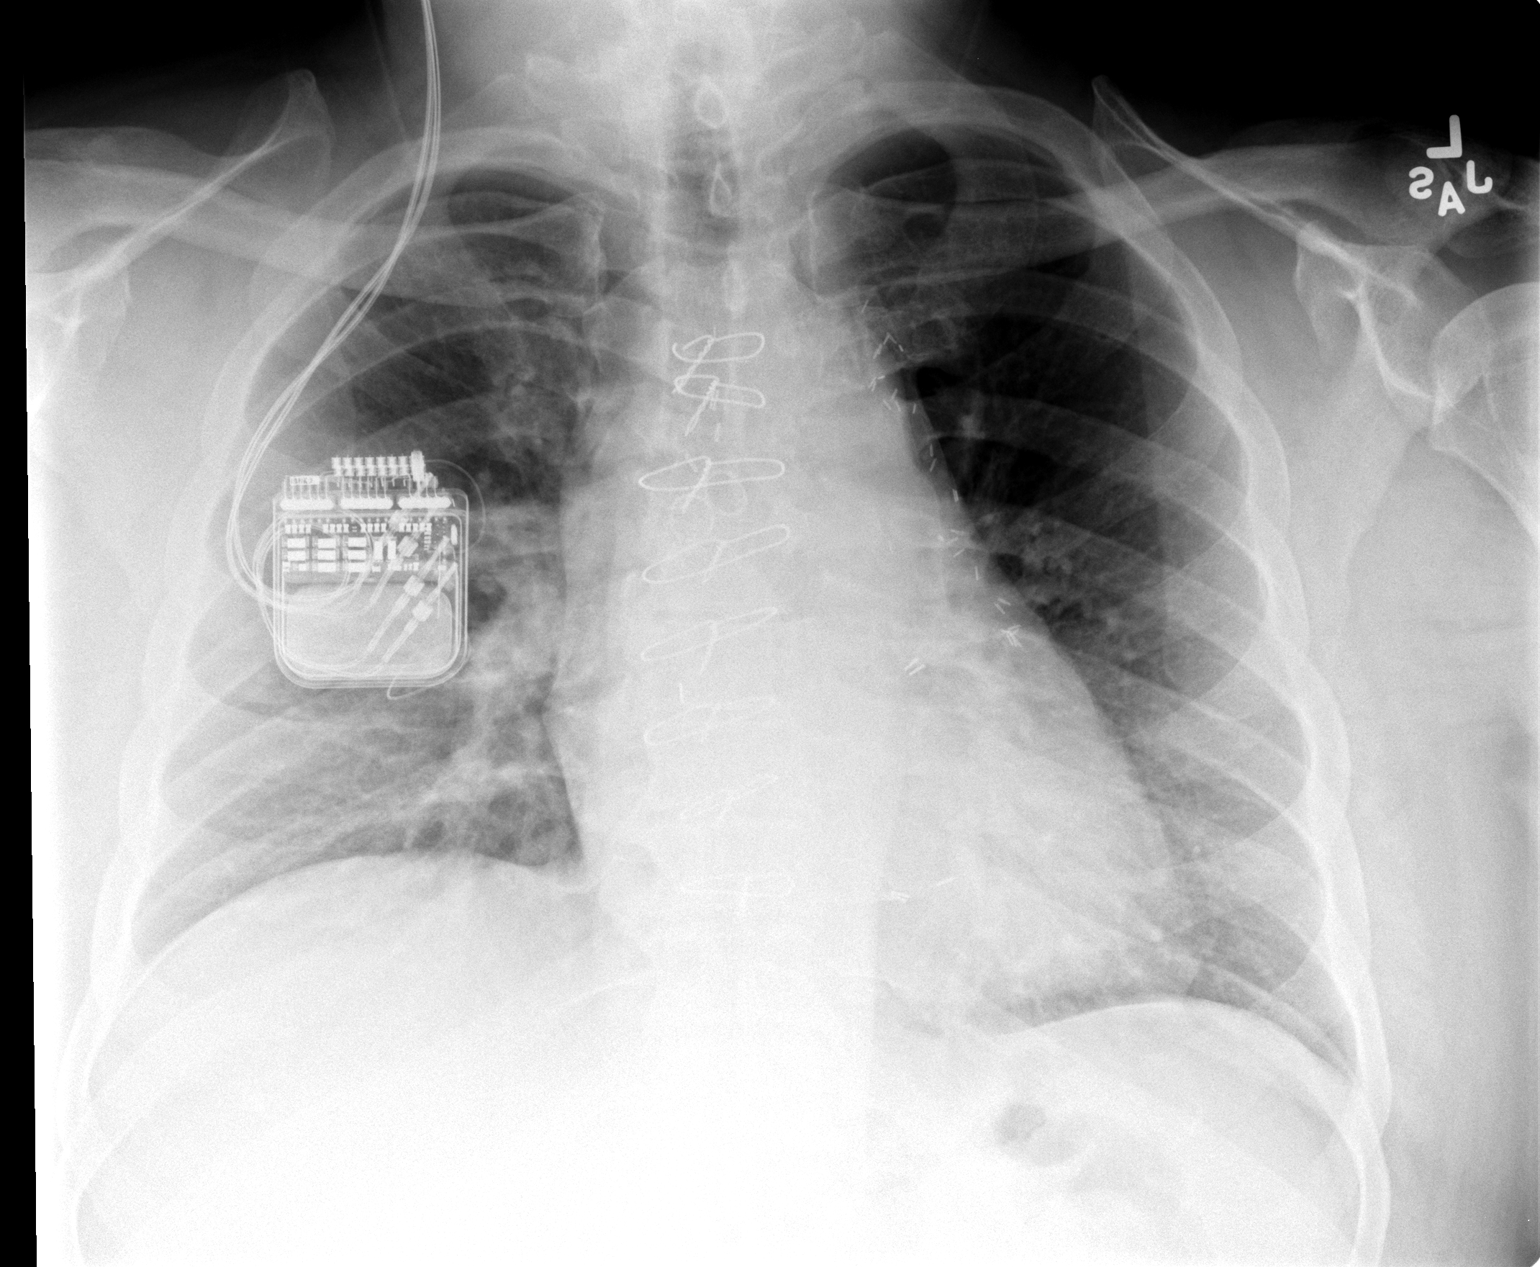

[view not recorded (2 of 3)]
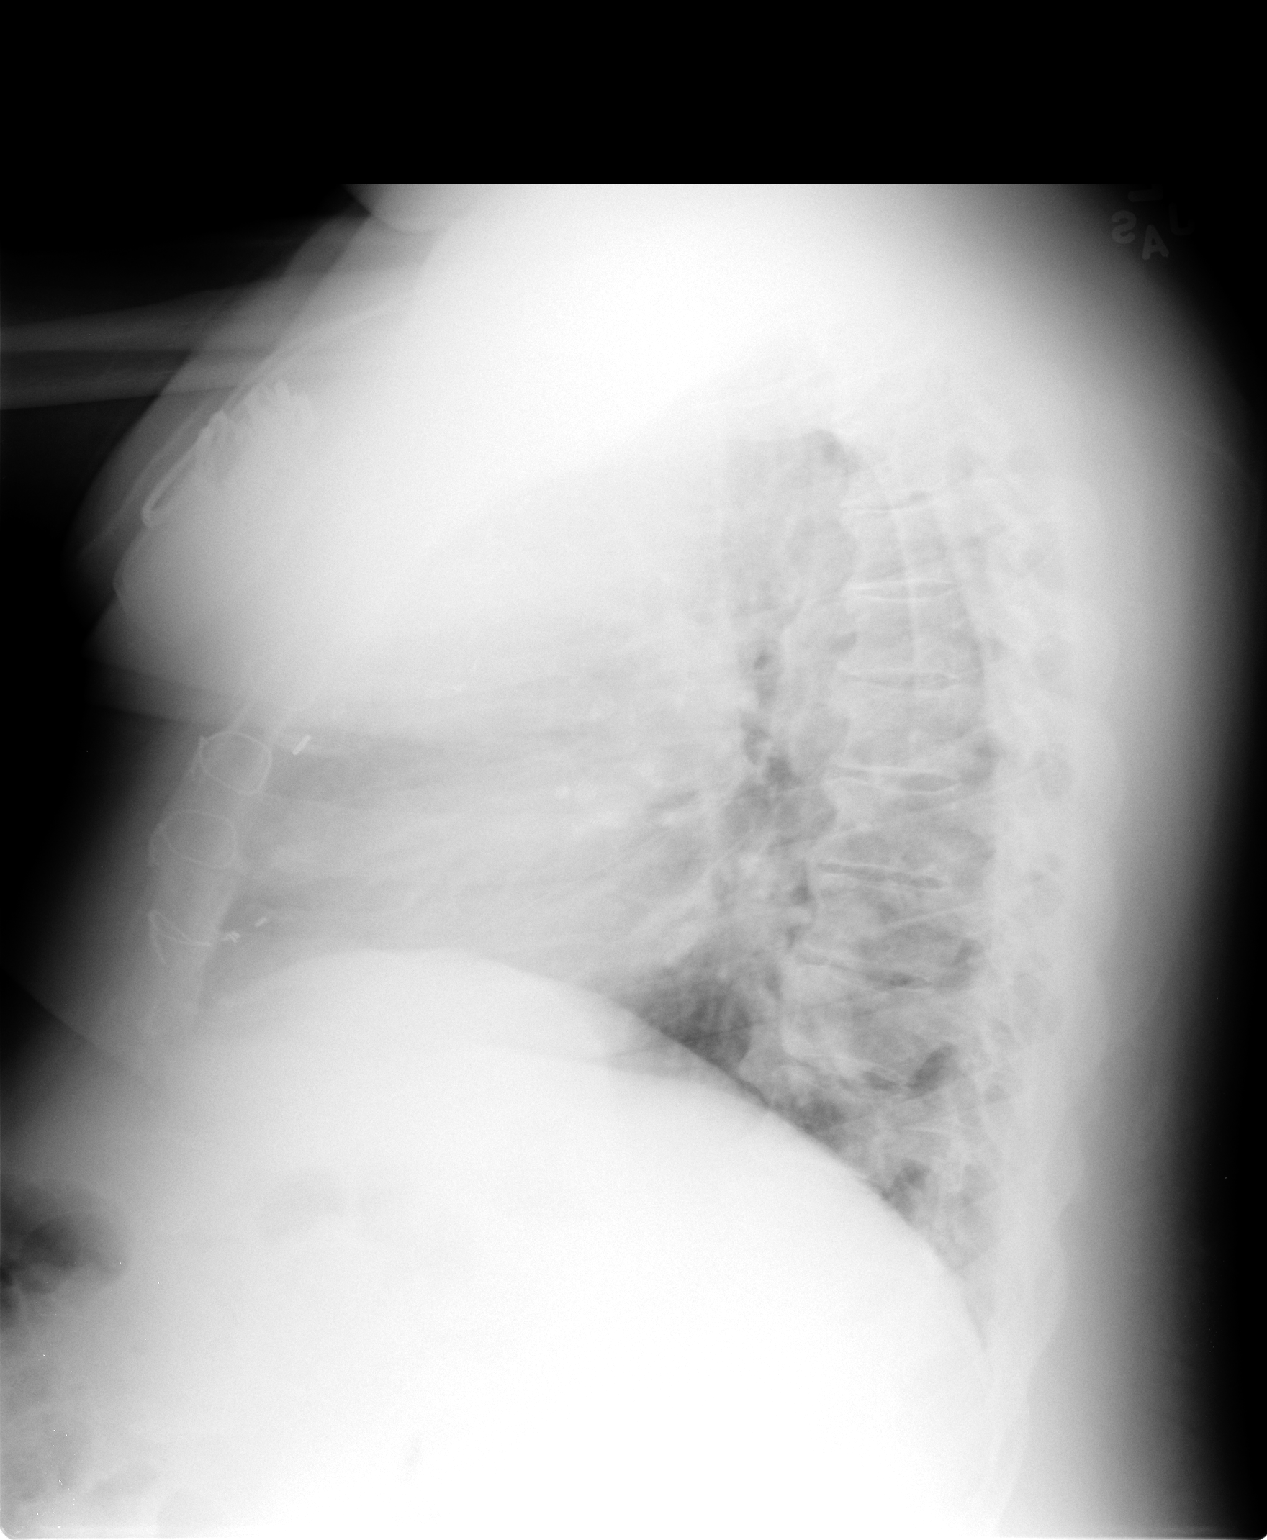

[view not recorded (3 of 3)]
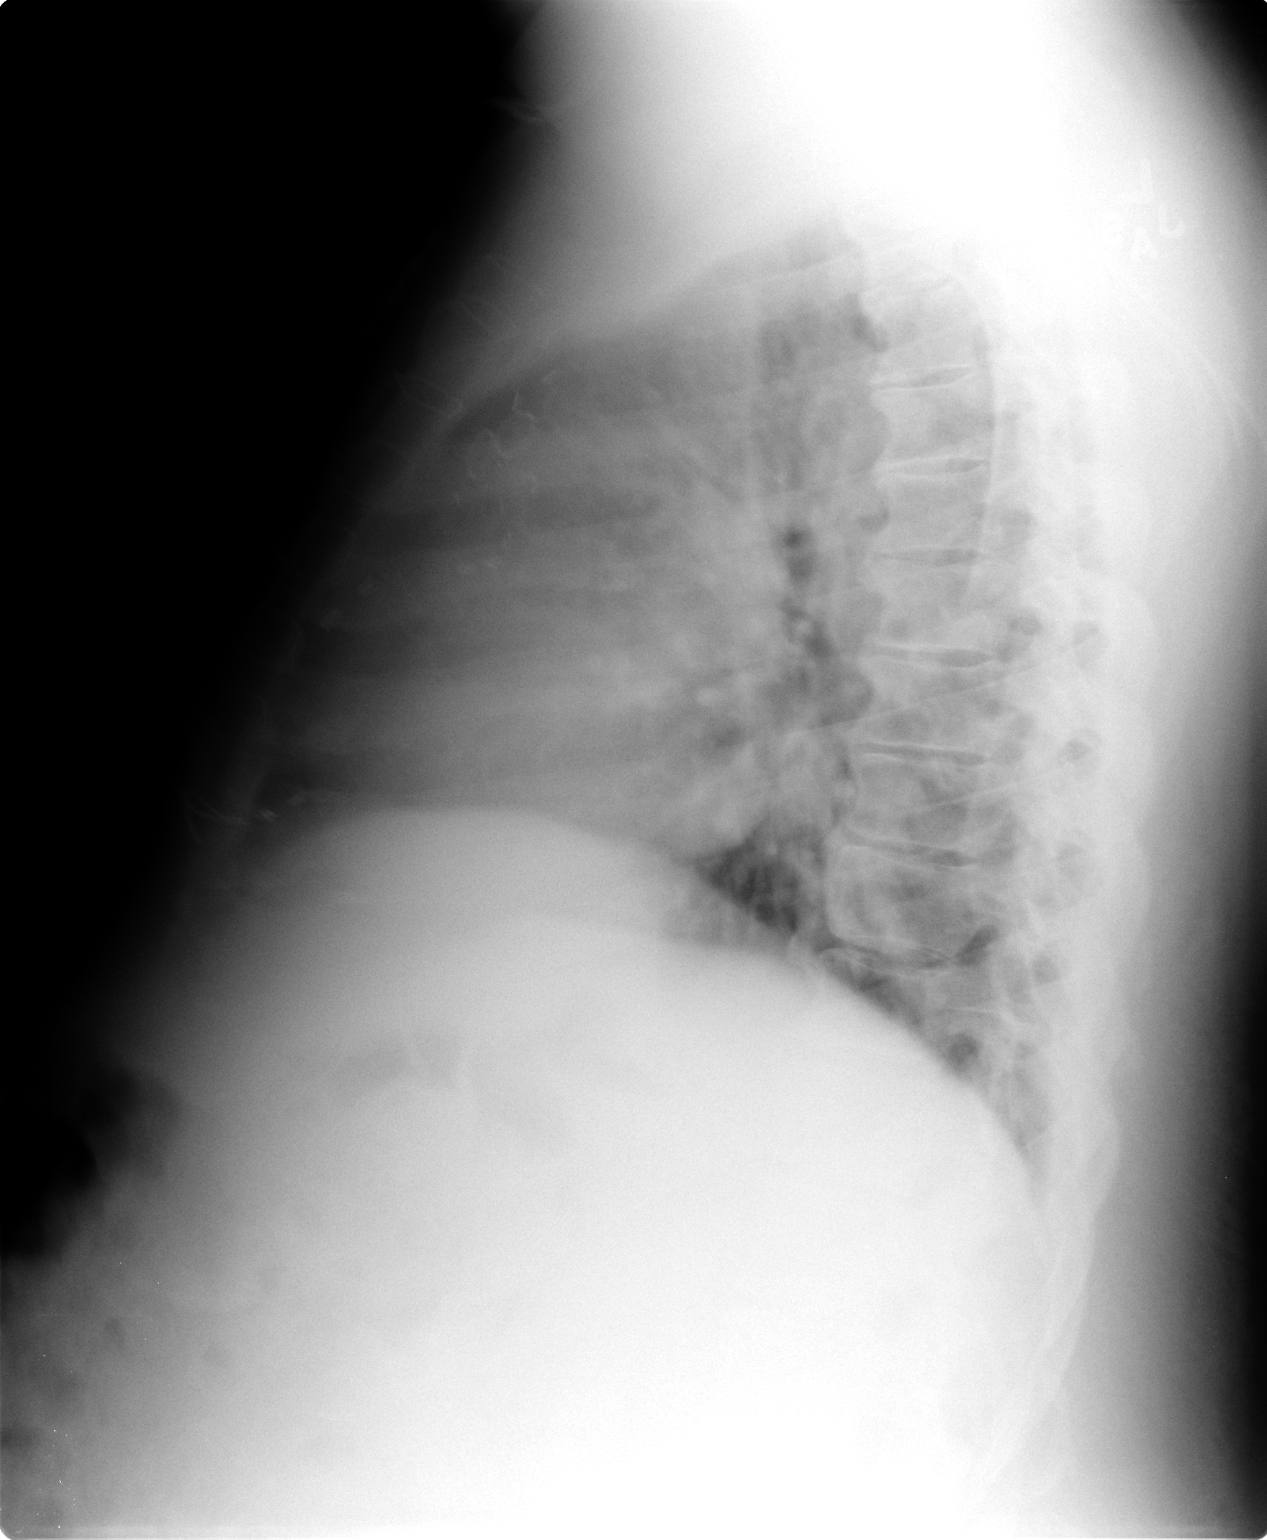

[3 of 3 positions shown; findings below may reference images not displayed]

FINDINGS: Stable cardiomediastinal silhouette. Status post coronary artery
bypass graft. No pneumothorax or pleural effusion is noted. No acute
pulmonary disease is noted. Bony thorax is intact. Stimulator device
is again noted over the right chest.
IMPRESSION: No acute cardiopulmonary abnormality seen.

## 2015-11-10 ENCOUNTER — Other Ambulatory Visit: Payer: Self-pay | Admitting: Endocrinology

## 2015-11-11 DIAGNOSIS — M6281 Muscle weakness (generalized): Secondary | ICD-10-CM | POA: Diagnosis not present

## 2015-11-11 DIAGNOSIS — R296 Repeated falls: Secondary | ICD-10-CM | POA: Diagnosis not present

## 2015-11-11 DIAGNOSIS — M545 Low back pain: Secondary | ICD-10-CM | POA: Diagnosis not present

## 2015-11-11 DIAGNOSIS — R2689 Other abnormalities of gait and mobility: Secondary | ICD-10-CM | POA: Diagnosis not present

## 2015-11-12 ENCOUNTER — Encounter: Payer: Self-pay | Admitting: Cardiology

## 2015-11-16 ENCOUNTER — Encounter: Payer: Self-pay | Admitting: Cardiology

## 2015-11-16 DIAGNOSIS — R296 Repeated falls: Secondary | ICD-10-CM | POA: Diagnosis not present

## 2015-11-16 DIAGNOSIS — M545 Low back pain: Secondary | ICD-10-CM | POA: Diagnosis not present

## 2015-11-16 DIAGNOSIS — R2689 Other abnormalities of gait and mobility: Secondary | ICD-10-CM | POA: Diagnosis not present

## 2015-11-16 DIAGNOSIS — M6281 Muscle weakness (generalized): Secondary | ICD-10-CM | POA: Diagnosis not present

## 2015-11-18 DIAGNOSIS — R296 Repeated falls: Secondary | ICD-10-CM | POA: Diagnosis not present

## 2015-11-18 DIAGNOSIS — R2689 Other abnormalities of gait and mobility: Secondary | ICD-10-CM | POA: Diagnosis not present

## 2015-11-18 DIAGNOSIS — M6281 Muscle weakness (generalized): Secondary | ICD-10-CM | POA: Diagnosis not present

## 2015-11-18 DIAGNOSIS — M545 Low back pain: Secondary | ICD-10-CM | POA: Diagnosis not present

## 2015-11-23 ENCOUNTER — Ambulatory Visit (INDEPENDENT_AMBULATORY_CARE_PROVIDER_SITE_OTHER): Payer: Medicare Other | Admitting: Cardiology

## 2015-11-23 ENCOUNTER — Encounter: Payer: Self-pay | Admitting: Cardiology

## 2015-11-23 VITALS — BP 96/64 | HR 72 | Ht 69.0 in | Wt 258.0 lb

## 2015-11-23 DIAGNOSIS — Z95810 Presence of automatic (implantable) cardiac defibrillator: Secondary | ICD-10-CM | POA: Diagnosis not present

## 2015-11-23 DIAGNOSIS — G4733 Obstructive sleep apnea (adult) (pediatric): Secondary | ICD-10-CM

## 2015-11-23 DIAGNOSIS — M545 Low back pain: Secondary | ICD-10-CM | POA: Diagnosis not present

## 2015-11-23 DIAGNOSIS — R296 Repeated falls: Secondary | ICD-10-CM | POA: Diagnosis not present

## 2015-11-23 DIAGNOSIS — I5043 Acute on chronic combined systolic (congestive) and diastolic (congestive) heart failure: Secondary | ICD-10-CM

## 2015-11-23 DIAGNOSIS — M6281 Muscle weakness (generalized): Secondary | ICD-10-CM | POA: Diagnosis not present

## 2015-11-23 DIAGNOSIS — G20 Parkinson's disease: Secondary | ICD-10-CM | POA: Diagnosis not present

## 2015-11-23 DIAGNOSIS — Z9989 Dependence on other enabling machines and devices: Secondary | ICD-10-CM

## 2015-11-23 DIAGNOSIS — I5022 Chronic systolic (congestive) heart failure: Secondary | ICD-10-CM | POA: Diagnosis not present

## 2015-11-23 DIAGNOSIS — I10 Essential (primary) hypertension: Secondary | ICD-10-CM | POA: Diagnosis not present

## 2015-11-23 DIAGNOSIS — Z951 Presence of aortocoronary bypass graft: Secondary | ICD-10-CM | POA: Diagnosis not present

## 2015-11-23 DIAGNOSIS — R2689 Other abnormalities of gait and mobility: Secondary | ICD-10-CM | POA: Diagnosis not present

## 2015-11-23 DIAGNOSIS — I255 Ischemic cardiomyopathy: Secondary | ICD-10-CM

## 2015-11-23 LAB — BASIC METABOLIC PANEL
BUN: 43 mg/dL — ABNORMAL HIGH (ref 7–25)
CO2: 38 mmol/L — ABNORMAL HIGH (ref 20–31)
Calcium: 9.4 mg/dL (ref 8.6–10.3)
Chloride: 82 mmol/L — ABNORMAL LOW (ref 98–110)
Creat: 1.93 mg/dL — ABNORMAL HIGH (ref 0.70–1.25)
Glucose, Bld: 240 mg/dL — ABNORMAL HIGH (ref 65–99)
Potassium: 2.8 mmol/L — ABNORMAL LOW (ref 3.5–5.3)
Sodium: 136 mmol/L (ref 135–146)

## 2015-11-23 MED ORDER — METOLAZONE 2.5 MG PO TABS: ORAL_TABLET | ORAL | 3 refills | Status: DC

## 2015-11-23 NOTE — Progress Notes (Signed)
11/23/2015 Henry [name]   1950-11-14  [id number]  Primary Physician Elsie Stain, MD Primary Cardiologist: Dr Stanford Breed  HPI:  65 y/o obese male with a history of coronary artery disease followed  By Dr Stanford Breed. 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 He has a history of Parkinson's- (s/p subthalamic stimulator), diabetes, hypertension and hyperlipidemia.  Echo done Aug 2015 showed an EF of 30-35 with moderate LAE. Cardiac catheterization September 2015 showed a pulmonary wedge pressure of 16 and PA pressure 32/20. The LAD was occluded. No obstructive disease in the circumflex. The right coronary had severe diffuse distal disease. The SVG to the PDA was patent, the SVG to diagonal was patent, and the LIMA to the mid LAD was patent. Medical therapy recommended. He had a MDT ICD placed November 2015.   He was seen  in the office 11/04/15 with complaints of gradually increasing DOE. He did say his pharmacist took him off his Aldactone a few weeks ago secondary to drug interaction with his Parkinson's medication.  Added Metolazone 2.5 mg three times a week and he is here today for follow up. He feels better, less fatigue and tolerating PT well. His wgt is down 10 lbs.    Current Outpatient Prescriptions  Medication Sig Dispense Refill  . acetaminophen (TYLENOL) 325 MG tablet Take 1-2 tablets (325-650 mg total) by mouth every 4 (four) hours as needed for mild pain.    Marland Kitchen albuterol (PROVENTIL HFA;VENTOLIN HFA) 108 (90 Base) MCG/ACT inhaler Inhale 2 puffs into the lungs every 6 (six) hours as needed for wheezing or shortness of breath. 18 g 5  . aspirin EC 81 MG tablet Take 81 mg by mouth daily.    . clonazePAM (KLONOPIN) 0.5 MG tablet TAKE 1 & 1/2 TABLETS BY MOUTH AT BEDTIME 135 tablet 1  . Fluticasone-Salmeterol (ADVAIR) 250-50 MCG/DOSE AEPB Inhale 1 puff into the lungs 2 (two) times daily. 60 each 5  . furosemide (LASIX) 80 MG tablet TAKE 1.5 TABLETS BY MOUTH DAILY. (Patient taking differently: Take 120 mg  by mouth daily. ) 135 tablet 1  . glimepiride (AMARYL) 4 MG tablet TAKE 1 TABLET BY MOUTH EVERY DAY IN THE MORNING AND 1/2 AT SUPPER 135 tablet 1  . glucose blood (ONETOUCH VERIO) test strip Use as instructed to check blood sugar 2 times a day Dx code E11.49 100 each 5  . HUMALOG 100 UNIT/ML injection USE MAX 56 UNITS PER DAY   WITH V-GO PUMP 60 mL 1  . ibuprofen (ADVIL,MOTRIN) 200 MG tablet Take 800 mg by mouth every 6 (six) hours as needed (For pain.).     . Insulin Disposable Pump (V-GO 20) KIT USE ONE PER DAY 1 kit 5  . KLOR-CON M10 10 MEQ tablet TAKE 3 TABLETS (30 MEQ TOTAL) BY MOUTH DAILY. 90 tablet 2  . levothyroxine (SYNTHROID, LEVOTHROID) 125 MCG tablet Take 1 tablet (125 mcg total) by mouth daily.    . metFORMIN (GLUCOPHAGE) 1000 MG tablet TAKE 1 TABLET (1,000 MG TOTAL) BY MOUTH 2 (TWO) TIMES DAILY WITH A MEAL. 180 tablet 3  . metolazone (ZAROXOLYN) 2.5 MG tablet Take 1 tablet on Tuesdays & Fridays ONLY; take 30 minutes before taking Lasix 30 tablet 3  . metoprolol succinate (TOPROL-XL) 50 MG 24 hr tablet TAKE 1 TABLET BY MOUTH DAILY. TAKE WITH OR IMMEDIATELY FOLLOWING A MEAL. 30 tablet 9  . Multiple Vitamin (MULTIVITAMIN WITH MINERALS) TABS tablet Take 1 tablet by mouth daily.     Marland Kitchen NOVOTWIST  32G X 5 MM MISC USE ONE PER DAY TO INJECT VICTOZA 100 each 2  . ONETOUCH DELICA LANCETS 67M MISC Use to check blood sugar 2 times per day dx code E11.49 100 each 5  . Pramipexole Dihydrochloride (MIRAPEX ER) 1.5 MG TB24 Take 1 tablet (1.5 mg total) by mouth daily. 30 tablet 4  . rosuvastatin (CRESTOR) 20 MG tablet Take 1 tablet (20 mg total) by mouth daily. 90 tablet 3  . traMADol (ULTRAM) 50 MG tablet TAKE 1 TABLET BY MOUTH TWICE A DAY AS NEEDED 60 tablet 3  . travoprost, benzalkonium, (TRAVATAN) 0.004 % ophthalmic solution Place 1 drop into the right eye at bedtime.     . trihexyphenidyl (ARTANE) 2 MG tablet TAKE 1 TABLET TWICE A DAY 180 tablet 1  . VICTOZA 18 MG/3ML SOPN INJECT 1.8MG ONCE DAILY  27 mL 1  . vitamin B-12 (CYANOCOBALAMIN) 1000 MCG tablet Take 1,000 mcg by mouth daily.    . nitroGLYCERIN (NITROSTAT) 0.4 MG SL tablet Place 1 tablet (0.4 mg total) under the tongue every 5 (five) minutes as needed for chest pain. 25 tablet 2   No current facility-administered medications for this visit.     Allergies  Allergen Reactions  . Penicillins Rash and Other (See Comments)    WEARS ALLERGY BRACELET Because of a history of documented adverse serious drug reaction;Medi Alert bracelet  is recommended Has patient had a PCN reaction causing immediate rash, facial/tongue/throat swelling, SOB or lightheadedness with hypotension: Yes Has patient had a PCN reaction causing severe rash involving mucus membranes or skin necrosis: unknown Has patient had a PCN reaction that required hospitalization NO Has patient had a PCN reaction occurring within the last 10 years: NO If all of     Social History   Social History  . Marital status: Married    Spouse name: [name]  . Number of children: 2  . Years of education: N/A   Occupational History  . 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 Smoking status: Never Smoker  . Smokeless tobacco: Never Used  . Alcohol use Yes     Comment: occasional beer  . Drug use: No  . Sexual activity: Not on file   Other Topics Concern  . Not on file   Social History Narrative   From [facility]   Retired/disability Clinical research associate   Likes to fish.     Married 1972   3 kids   [place] fan     Review of Systems: General: negative for chills, fever, night sweats or weight changes.  Cardiovascular: negative for chest pain, dyspnea on exertion, edema, orthopnea, palpitations, paroxysmal nocturnal dyspnea or shortness of breath Dermatological: negative for rash Respiratory: negative for cough or wheezing Urologic: negative for hematuria Abdominal: negative for nausea, vomiting, diarrhea, bright red blood per  rectum, melena, or hematemesis Neurologic: negative for visual changes, syncope, or dizziness All other systems reviewed and are otherwise negative except as noted above.    Blood pressure 96/64, pulse 72, height _0  (1.753 m), weight 258 lb (117 kg).  General appearance: alert, cooperative, no distress and mildly obese Neck: no carotid bruit and no JVD Lungs: clear to auscultation bilaterally Heart: regular rate and rhythm Extremities: no edema Skin: Skin color, texture, turgor normal. No rashes or lesions Neurologic: Grossly normal  EKG NSR, TWI V3-V6 (old-seen on April 2017 EKG as well)  ASSESSMENT AND PLAN:   Acute on chronic combined systolic  and diastolic congestive heart failure, NYHA class 4 Pt here with complaints of increased DOE. He notes he was taken off Aldactone 4 weeks ago by his pharmacist secondary to drug interaction  Chronic systolic CHF (congestive heart failure) Last EF 30-35% by echo Aug 2015  ICD- MDT, implanted 11/13/13 Followed by Dr Lovena Le, this has never gone off according to the pt  Hx of CABG x 3 in 2000 Patent grafts at cath Sept 2015  PD (Parkinson's disease) 2007 Deep brain stimulator ; Dr Vertell Limber S/P battery change 2014 and June 2017   PLAN  I suggested we decrease the Metolazone to 2.5 mg Friday and Tuesday. If his wgt gets down to less than 255 I suggested her not take it. He had labs drawn today and will follow these up.  Keep F/U with Dr Stanford Breed in 3 months  Kerin Ransom [facility] 11/23/2015 4:22 PM

## 2015-11-23 NOTE — Patient Instructions (Signed)
Medication Instructions:  DIRECTIONS on Metolazone has changed, You will now Take 1 tablet on Tuesdays and Fridays ONLY   Labwork: NONE ordered today we will contact you with the results once recieved  Testing/Procedures: None   Follow-Up: Your physician recommends that you schedule a follow-up appointment in: 3 MONTHS WITH DR Stanford Breed  Any Other Special Instructions Will Be Listed Below (If Applicable).     If you need a refill on your cardiac medications before your next appointment, please call your pharmacy.

## 2015-11-24 ENCOUNTER — Other Ambulatory Visit: Payer: Self-pay | Admitting: Neurology

## 2015-11-24 ENCOUNTER — Telehealth: Payer: Self-pay | Admitting: *Deleted

## 2015-11-24 DIAGNOSIS — E876 Hypokalemia: Secondary | ICD-10-CM

## 2015-11-24 LAB — BRAIN NATRIURETIC PEPTIDE: Brain Natriuretic Peptide: 14.1 pg/mL (ref <100)

## 2015-11-24 NOTE — Telephone Encounter (Signed)
Tried to call patient with no answer  

## 2015-11-24 NOTE — Telephone Encounter (Signed)
Jade, call patient and ask him.  I changed b/c of RLS.  If cost issue, may need to go back to old drug and add qhs levodopa or gabapentin

## 2015-11-24 NOTE — Telephone Encounter (Signed)
See last note and advise about changing back to IR.  Patient has follow up scheduled 12/17/15.

## 2015-11-24 NOTE — Telephone Encounter (Signed)
-----   Message from Erlene Quan, [place] sent at 11/24/2015  2:42 PM EST ----- Please tell the pt to take an extra 20 meq of KCL now x 1 and increase his daily K+ to 40 meq.  His diuretics have been cut back. He needs a f/u BMP in 7-10 days  LUKE KILROY PA-C 11/24/2015 2:42 PM

## 2015-11-24 NOTE — Telephone Encounter (Addendum)
Unable to reach pt or leave a message  Message for patient to call sent through my chart

## 2015-11-25 ENCOUNTER — Telehealth: Payer: Self-pay | Admitting: Family Medicine

## 2015-11-25 DIAGNOSIS — R2689 Other abnormalities of gait and mobility: Secondary | ICD-10-CM | POA: Diagnosis not present

## 2015-11-25 DIAGNOSIS — R296 Repeated falls: Secondary | ICD-10-CM | POA: Diagnosis not present

## 2015-11-25 DIAGNOSIS — M545 Low back pain: Secondary | ICD-10-CM | POA: Diagnosis not present

## 2015-11-25 DIAGNOSIS — M6281 Muscle weakness (generalized): Secondary | ICD-10-CM | POA: Diagnosis not present

## 2015-11-25 NOTE — Telephone Encounter (Signed)
Patient Name: Henry [name]  DOB: November 02, 1950    Initial Comment Caller states that he is constipated x 2 days; enema and suppositories have been ineffective    Nurse Assessment  Nurse: Christel Mormon, RN, Levada Dy Date/Time (Eastern Time): 11/25/2015 7:05:18 PM  Confirm and document reason for call. If symptomatic, describe symptoms. You must click the next button to save text entered. ---Caller states he is having BM's about every 3 days. He had one yesterday and it was a solid "log". Tonight, he took a suppository and enema and no stool. He states he can feel it there but it wont come out.  Has the patient traveled out of the country within the last 30 days? ---Not Applicable  Does the patient have any new or worsening symptoms? ---Yes  Will a triage be completed? ---Yes  Related visit to physician within the last 2 weeks? ---Yes  Does the PT have any chronic conditions? (i.e. diabetes, asthma, etc.) ---Yes  List chronic conditions. ---HTN, heart attack, COPD? - takes Albuterol, high cholesterol, "dreams"-takes Clonazepam, CHF,  Is this a behavioral health or substance abuse call? ---No     Guidelines    Guideline Title Affirmed Question Affirmed Notes  Constipation Unable to have a bowel movement (BM) without laxative or enema    Final Disposition User   See PCP When Office is Open (within 3 days) [place], RN, Levada Dy    Referrals  REFERRED TO PCP OFFICE  REFERRED TO PCP OFFICE   Disagree/Comply: Comply

## 2015-11-26 ENCOUNTER — Ambulatory Visit (INDEPENDENT_AMBULATORY_CARE_PROVIDER_SITE_OTHER): Payer: Medicare Other | Admitting: Family Medicine

## 2015-11-26 ENCOUNTER — Encounter: Payer: Self-pay | Admitting: Family Medicine

## 2015-11-26 DIAGNOSIS — I255 Ischemic cardiomyopathy: Secondary | ICD-10-CM

## 2015-11-26 DIAGNOSIS — K5900 Constipation, unspecified: Secondary | ICD-10-CM

## 2015-11-26 MED ORDER — POTASSIUM CHLORIDE CRYS ER 10 MEQ PO TBCR: 40.0000 meq | EXTENDED_RELEASE_TABLET | Freq: Every day | ORAL | Status: DC

## 2015-11-26 NOTE — Progress Notes (Signed)
Pre visit review using our clinic review tool, if applicable. No additional management support is needed unless otherwise documented below in the visit note. 

## 2015-11-26 NOTE — Telephone Encounter (Signed)
Pt has appt with Dr Damita Dunnings 11/26/15 at 9:30

## 2015-11-26 NOTE — Patient Instructions (Signed)
Take a dose of magnesium citrate daily until you have a normal bowel movement.  Stop it at that point.   Start taking miralax daily in the meantime.  Continue that as needed afterward.  Take extra potassium (4 tabs a day) and call the cardiology clinic about getting follow up labs done.  Take care.  Glad to see you.

## 2015-11-26 NOTE — Progress Notes (Signed)
Recent hx of constipation.  Some BMs recently, ie not impacted.  No abd pain except for some cramping before a BM.  Has tried citrate of magnesia and suppositories and an enema.  Last large BM was 4 days ago after mag citrate use.  No blood in stool except for once with wiping a lot.  No fevers.  Not on typical meds that would have caused constipation.  Still passing gas.  Small BM today.   Meds, vitals, and allergies reviewed.   ROS: Per HPI unless specifically indicated in ROS section   GEN: nad, alert and oriented HEENT: mucous membranes moist NECK: supple w/o LA CV: rrr.  PULM: ctab, no inc wob ABD: soft, +bs, not ttp EXT: no edema

## 2015-11-27 DIAGNOSIS — K5900 Constipation, unspecified: Secondary | ICD-10-CM | POA: Insufficient documentation

## 2015-11-27 NOTE — Assessment & Plan Note (Signed)
Not impacted. Small bowel movement this morning. Still passing gas. Abdomen soft. Normal bowel sounds. Okay for outpatient follow-up. Discussed with patient about options. He declined manual disimpaction. Take a dose of magnesium citrate daily until a normal bowel movement.  Stop it at that point.   Start taking miralax daily in the meantime.  Continue that as needed afterward.  He had not gotten the message about his potassium dosing. Message, results from cardiology given to patient. He understood what to do with his medications in the meantime. See after visit summary

## 2015-11-30 DIAGNOSIS — M545 Low back pain: Secondary | ICD-10-CM | POA: Diagnosis not present

## 2015-11-30 DIAGNOSIS — M6281 Muscle weakness (generalized): Secondary | ICD-10-CM | POA: Diagnosis not present

## 2015-11-30 DIAGNOSIS — R296 Repeated falls: Secondary | ICD-10-CM | POA: Diagnosis not present

## 2015-11-30 DIAGNOSIS — R2689 Other abnormalities of gait and mobility: Secondary | ICD-10-CM | POA: Diagnosis not present

## 2015-11-30 NOTE — Telephone Encounter (Signed)
Spoke with pt wife, they were told of the increase by dr Damita Dunnings. Lab orders mailed to the pt for repeat.

## 2015-12-06 ENCOUNTER — Other Ambulatory Visit (INDEPENDENT_AMBULATORY_CARE_PROVIDER_SITE_OTHER): Payer: Medicare Other

## 2015-12-06 DIAGNOSIS — Z114 Encounter for screening for human immunodeficiency virus [HIV]: Secondary | ICD-10-CM

## 2015-12-06 DIAGNOSIS — E785 Hyperlipidemia, unspecified: Secondary | ICD-10-CM | POA: Diagnosis not present

## 2015-12-06 DIAGNOSIS — Z119 Encounter for screening for infectious and parasitic diseases, unspecified: Secondary | ICD-10-CM | POA: Diagnosis not present

## 2015-12-06 LAB — LIPID PANEL
CHOLESTEROL: 132 mg/dL (ref 0–200)
HDL: 30.2 mg/dL — AB (ref >39.00)
NonHDL: 101.59
TRIGLYCERIDES: 217 mg/dL — AB (ref 0.0–149.0)
Total CHOL/HDL Ratio: 4
VLDL: 43.4 mg/dL — AB (ref 0.0–40.0)

## 2015-12-06 LAB — LDL CHOLESTEROL, DIRECT: Direct LDL: 69 mg/dL

## 2015-12-07 ENCOUNTER — Telehealth: Payer: Self-pay | Admitting: Endocrinology

## 2015-12-07 DIAGNOSIS — R2689 Other abnormalities of gait and mobility: Secondary | ICD-10-CM | POA: Diagnosis not present

## 2015-12-07 DIAGNOSIS — M545 Low back pain: Secondary | ICD-10-CM | POA: Diagnosis not present

## 2015-12-07 DIAGNOSIS — R296 Repeated falls: Secondary | ICD-10-CM | POA: Diagnosis not present

## 2015-12-07 DIAGNOSIS — M6281 Muscle weakness (generalized): Secondary | ICD-10-CM | POA: Diagnosis not present

## 2015-12-07 LAB — HIV ANTIBODY (ROUTINE TESTING W REFLEX): HIV: NONREACTIVE

## 2015-12-07 LAB — HEPATITIS C ANTIBODY: HCV AB: NEGATIVE

## 2015-12-07 NOTE — Telephone Encounter (Signed)
[facility] calling on the status of formed fax over for vgo pump.

## 2015-12-09 DIAGNOSIS — R296 Repeated falls: Secondary | ICD-10-CM | POA: Diagnosis not present

## 2015-12-09 DIAGNOSIS — M545 Low back pain: Secondary | ICD-10-CM | POA: Diagnosis not present

## 2015-12-09 DIAGNOSIS — R2689 Other abnormalities of gait and mobility: Secondary | ICD-10-CM | POA: Diagnosis not present

## 2015-12-09 DIAGNOSIS — M6281 Muscle weakness (generalized): Secondary | ICD-10-CM | POA: Diagnosis not present

## 2015-12-10 DIAGNOSIS — E876 Hypokalemia: Secondary | ICD-10-CM | POA: Diagnosis not present

## 2015-12-10 LAB — BASIC METABOLIC PANEL
BUN: 30 mg/dL — ABNORMAL HIGH (ref 7–25)
CO2: 37 mmol/L — ABNORMAL HIGH (ref 20–31)
Calcium: 9.7 mg/dL (ref 8.6–10.3)
Chloride: 90 mmol/L — ABNORMAL LOW (ref 98–110)
Creat: 1.64 mg/dL — ABNORMAL HIGH (ref 0.70–1.25)
Glucose, Bld: 154 mg/dL — ABNORMAL HIGH (ref 65–99)
Potassium: 3.2 mmol/L — ABNORMAL LOW (ref 3.5–5.3)
Sodium: 140 mmol/L (ref 135–146)

## 2015-12-10 NOTE — Telephone Encounter (Signed)
Called but got voice mail- asked for a call back to verify the information needed

## 2015-12-10 NOTE — Telephone Encounter (Signed)
Jr from [facility] sending a request for patient last office notes, Vgo 20 units,  For for medical necessity, clinical notes, labs.  Please advise  [phone]  Ex [phone]

## 2015-12-14 DIAGNOSIS — M545 Low back pain: Secondary | ICD-10-CM | POA: Diagnosis not present

## 2015-12-14 DIAGNOSIS — M6281 Muscle weakness (generalized): Secondary | ICD-10-CM | POA: Diagnosis not present

## 2015-12-14 DIAGNOSIS — R296 Repeated falls: Secondary | ICD-10-CM | POA: Diagnosis not present

## 2015-12-14 DIAGNOSIS — R2689 Other abnormalities of gait and mobility: Secondary | ICD-10-CM | POA: Diagnosis not present

## 2015-12-15 ENCOUNTER — Encounter: Payer: Self-pay | Admitting: Family Medicine

## 2015-12-15 ENCOUNTER — Other Ambulatory Visit: Payer: Self-pay

## 2015-12-16 ENCOUNTER — Ambulatory Visit: Payer: Medicare Other | Admitting: Neurology

## 2015-12-16 ENCOUNTER — Other Ambulatory Visit: Payer: Self-pay | Admitting: Family Medicine

## 2015-12-16 MED ORDER — POTASSIUM CHLORIDE CRYS ER 10 MEQ PO TBCR: 40.0000 meq | EXTENDED_RELEASE_TABLET | Freq: Every day | ORAL | 1 refills | Status: DC

## 2015-12-16 NOTE — Progress Notes (Signed)
Henry [name] was seen today in the movement disorders clinic for neurologic consultation at the request of [name].  His PCP is [name], MD.  The consultation is for the evaluation of PD and to manage his DBS.  He is accompanied by his wife who supplements the history.  The first symptom(s) the patient noticed was right hand tremor in 1999.  He was seen by neurology and was dx with PD.  He was placed on something, but it caused sleepiness.  He does not think that he has ever been on levodopa.   He was placed on Mirapex, which seemed to help.  He only takes it twice per day.  He didn't think it made a difference when he took it tid.  He began to have tremor and it was suggested he do DBS.    The pt is s/p stn DBS in 2005.  He had a battery change in 2009.    10/02/12 update: The patient is accompanied today by his daughter, who supplements the history.  I reviewed medical records available to me since last visit.  The patient had his generator changed on 09/05/2012.  He remains on pramipexole, 0.5 mg twice per day.  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 [name] in that regard.  He is not compliant with CPAP so says that he doesn't want to return to her for f/u. He doesn't take the klonopin faithfully.  10/11/12 update:  Pt was seen as a walk in/work in today.  Pt had increasing tremor, L greater than R for 3 days.  Thinks that it started with the d/c of artane.  Speech stable.  11/19/12 update:  Pt is seen today for his PD, accompanied by his daughter who supplements the hx.    He is currently on klonopin 0.5 mg - 1/2-1 tablet q hs.  He only takes it when his wife is not working.  She works nights and is only in 2 days per week.  He has some reluctance to take it other nights.  He is on pramipexole 0.5 mg bid.  Last visit, his DBS was reset more similar to the settings he had prior to coming here, just with an increased voltage.  About 2 weeks after our last  visit, the patient decided to go back on the Artane.  The combination of the Artane and the DBS changes helped significantly.  He does ask if I can slightly increased voltage on the left hand, as he has some tremor at night that is bothersome.  Otherwise, he is doing well.  He feels that his balance has been great.  02/19/13 update:  This patient is accompanied in the office by his child who supplements the history.  The pt has a hx of PD.  He has been tremor free and is very happy about that.  He has had some increased balance loss; the last fall was a few months ago but he hasn't gotten hurt.  Was considering hernia surgery.  The records that were made available to me were reviewed.  He is holding on that for now but plans to have it done before end of year.  No hallucinations.  He is still having some acting out of the dreams, despite clonazepam.  06/19/13 update:  Patient is returning to followup regarding his Parkinson's disease.  I had the opportunity to review records since last visit.  He went to the emergency room on 05/12/2013   with chest pain and shortness of breath.  He had been fishing and had missed several doses of Lasix.  He had been eating seafood.  He ended up with an acute exacerbation of his chronic congestive heart failure.  Once he was diuresed and back on his medications, he is feeling better.  He just had a nuclear medicine study done on 06/17/2013 and the ejection fraction on this looked better than on his echocardiogram.  The left ventricular ejection fraction was 41%.  Prior to that, there was some concern that his left ventricular ejection fraction had dropped so much that he may need an ICD.  In terms of Parkinson's disease, the patient states that he has been doing very well in terms of tremor, but his walking really has deteriorated.  He describes a festinating gait, with much more shuffling.  He fell walking over his dog gate but otherwise has not had falls.  He went fishing  yesterday and states that he "stumbled all day long."  No hallucinations.  He has been trying to be more faithful with his CPAP. He remains on Mirapex 0.5 mg twice a day, Artane 2 mg twice a day.  10/23/13 update:  The patient returns today to the clinic, accompanied by his wife who supplements the history.  From a Parkinson's standpoint, the patient states that he has been doing very well.  No tremor.  He started on levodopa last visit and states that he has had no falls and overall stumbling has been much better.  He states that the only time that he stumbles is if he is inside of his fishing boat, and states that that is really because it is small and unstable.  He is taking his levodopa in the morning, after lunch and bedtime.  He has had no hallucinations.  His wife states that he is still draining and acting out the dreams and even fell out of bed a few days ago.  However, he is taking his clonazepam about 10 PM but doesn't go to bed until 1 AM.  He is not using his CPAP.  Is having significant constipation.   I reviewed his cardiology records since last visit.  He did have a heart catheterization and is scheduled to have an ICD placed on November 5.  02/23/14 update:  Pt returns for follow up accompanied by his wife who supplements the history.  The records that were made available to me were reviewed since last visit.  He had an ICD placed on 11/15/13.  He is doing well.  Taking carbidopa/levodopa 25/100 three times a day and remains on artane as well as mirapex 0.5 mg bid.  Had a fall up the stairs.  Wife describes festinating gate and pt thinks that levodopa contributes.  He also doesn't feel good much of the time and thinks that is from levodopa.  Admits, however, that when BS under good control, he feels good.  Has started seeing endocrinology and started on new meds.  He has klonopin for RBD.  Still vivid dreams but no falling out bed (in recliner now).  Having some word finding trouble and memory  doesn't seem clear.  Also, put back tailgate down on car and came down on generator and wants me to look at that.    07/01/14 update:  The patient follows up today regarding his Parkinson's disease.  He is accompanied by his wife who supplements the history.  Last visit, the patient was complaining about memory change which I  thought was likely multifactorial and due to medication such as Artane, Mirapex and Ultram, which he was taking every 3 hours.  The patient, however, thought that it was from levodopa and so we held it and the patient reported that he felt better and therefore continued to stay off of the medication.  He reports that falls are better after d/c levodopa but wife reports still falls.  Pt states that he only fell in shower after he d/c levodopa.  His wife does admit that she thinks that levodopa was causing loss of balance.  He is having more tremor over the last month on the L hand.   He remains on clonazepam 0.5 mg, 1-1/2 tablets at night for REM behavior disorder.  I reviewed records since last visit.  He has seen Dr. Kumar multiple times in regards to his uncontrolled diabetes.  He started on insulin on May 22 2014.  He states that he is doing well with injecting himself.  Having a lot of constipation.  Has the rancho recipe.    11/03/14 update:  The patient is following up today, accompanied by his wife who supplements the history.  Records were reviewed since our last visit.  The patient is on pramipexole 0.5 mg twice a day and Artane 2 mg twice a day.  He has not had any hallucinations.  Had a fall the other day walking up stairs carrying something and fell backward.  Hit his back.  No LOC.  Also fell backward in the summer around the pool.  Fell one time out of his boat.  Doesn't want to use a walker. He was in the emergency room recently with a rash that was felt secondary to insect bites that he got in the woods.  He also recently had a colonoscopy.  His diabetes is under better  control and his last A1c was 7.2.  States that he now has an insulin pump.  He remains on clonazepam 0.5 mg, 1-1/2 tablets at night for REM behavior disorder.  More back pain lately and will start injections for the back which have helped previously  02/04/15 update:  The patient presents today, accompanied by his wife who supplements the history.  I have reviewed prior records made available to me.  He has established a new primary care doctor in Dr. Duncan.  The patient remains on Artane, 2 mg twice a day as well as pramipexole 0.5 mg twice a day.  Overall, he has been doing fairly well.  He occasionally still has some tremor.  He had 2 falls since our last visit; he states that he was pushing a gate open and it came back and hit him.  He was also carrying wood the other day and fell forward with wood.  No hallucinations.  No lightheadedness or near syncope.  He remains on clonazepam 0.5 mg, 1-1/2 tablets at night for REM behavior disorder.  [facility]    06/08/15 update:  This patient is accompanied in the office by his spouse who supplements the history.  The patient remains on Artane, 2 mg twice a day as well as pramipexole 0.5 mg twice a day.  He tripped over the curb [place] [employer].  He tripped over the step on his deck.   He remains on clonazepam 0.5 mg, 1-1/2 tablets at night for REM behavior disorder.  He is still having trouble with that.  He fell out of bed the other night.  Wife   asks about bed rails.   He felt that [facility] helped.  Is looking forward to getting in his pool.    09/09/15 update:  The patient follows up today, accompanied by his wife who supplements the history.  He is on pramipexole 0.5 g twice a day and trihexyphenidyl 2 mg twice a day.  Didn't go up to the 1.5 mg of pramipexole.   He is on clonazepam 0.5 mg, 1-1/2 tablets at night for REM behavior disorder.  His biggest issue is RLS at bedtime.  It is preventing him from going to sleep.    He denies any hallucinations.  He denies cognitive change.  He continues to struggle with balance and has fallen/stumbled a few times.  He has fallen in the boat.  Wife describes festinating gait.  Wife also describes crying frequently.  Denies depression. He has his battery changed on 06/10/15  12/18/15 update:  Pt is following up today, accompanied by his wife who supplements the history.  Last visit, I changed him from pramipexole, 0.5 mg twice per day to Mirapex ER, 1.5 mg once per day.  He states that it is very expensive.  He remains on trihexyphenidyl, 2 mg twice per day.  He is also on clonazepam 0.5 mg, 1-1/2 tablets at night for REM behavior disorder. It turns out he was taking this at 6pm.   I reviewed records since last visit.  He reports that his pharmacist to come off his Aldactone because of an interaction with his Parkinson's medicine.  I am unaware of any interaction with his current medications.  Been having issues with constipation, which has been treated by Dr. Para March.  [facility] in [facility].  When asked about falls, his wife states that he falls all the time but he is more conscious of festinating gait now that he is in PT.  Not gotten hurt with falls.     PREVIOUS MEDICATIONS: Mirapex and artane  ALLERGIES:   Allergies  Allergen Reactions  . Penicillins Rash and Other (See Comments)    WEARS ALLERGY BRACELET Because of a history of documented adverse serious drug reaction;Medi Alert bracelet  is recommended Has patient had a PCN reaction causing immediate rash, facial/tongue/throat swelling, SOB or lightheadedness with hypotension: Yes Has patient had a PCN reaction causing severe rash involving mucus membranes or skin necrosis: unknown Has patient had a PCN reaction that required hospitalization NO Has patient had a PCN reaction occurring within the last 10 years: NO If all of     CURRENT MEDICATIONS:     Medication List       Accurate as of 12/16/15  7:52 AM. Always  use your most recent med list.          acetaminophen 325 MG tablet Commonly known as:  TYLENOL Take 1-2 tablets (325-650 mg total) by mouth every 4 (four) hours as needed for mild pain.   albuterol 108 (90 Base) MCG/ACT inhaler Commonly known as:  PROVENTIL HFA;VENTOLIN HFA Inhale 2 puffs into the lungs every 6 (six) hours as needed for wheezing or shortness of breath.   aspirin EC 81 MG tablet Take 81 mg by mouth daily.   clonazePAM 0.5 MG tablet Commonly known as:  KLONOPIN TAKE 1 & 1/2 TABLETS BY MOUTH AT BEDTIME   Fluticasone-Salmeterol 250-50 MCG/DOSE Aepb Commonly known as:  ADVAIR Inhale 1 puff into the lungs 2 (two) times daily.   furosemide 80 MG tablet Commonly known as:  LASIX TAKE 1.5 TABLETS BY  MOUTH DAILY.   glimepiride 4 MG tablet Commonly known as:  AMARYL TAKE 1 TABLET BY MOUTH EVERY DAY IN THE MORNING AND 1/2 AT SUPPER   glucose blood test strip Commonly known as:  ONETOUCH VERIO Use as instructed to check blood sugar 2 times a day Dx code E11.49   HUMALOG 100 UNIT/ML injection Generic drug:  insulin lispro USE MAX 56 UNITS PER DAY   WITH V-GO PUMP   ibuprofen 200 MG tablet Commonly known as:  ADVIL,MOTRIN Take 800 mg by mouth every 6 (six) hours as needed (For pain.).   levothyroxine 125 MCG tablet Commonly known as:  SYNTHROID, LEVOTHROID Take 1 tablet (125 mcg total) by mouth daily.   metFORMIN 1000 MG tablet Commonly known as:  GLUCOPHAGE TAKE 1 TABLET (1,000 MG TOTAL) BY MOUTH 2 (TWO) TIMES DAILY WITH A MEAL.   metolazone 2.5 MG tablet Commonly known as:  ZAROXOLYN Take 1 tablet on Tuesdays & Fridays ONLY; take 30 minutes before taking Lasix   metoprolol succinate 50 MG 24 hr tablet Commonly known as:  TOPROL-XL TAKE 1 TABLET BY MOUTH DAILY. TAKE WITH OR IMMEDIATELY FOLLOWING A MEAL.   multivitamin with minerals Tabs tablet Take 1 tablet by mouth daily.   nitroGLYCERIN 0.4 MG SL tablet Commonly known as:  NITROSTAT Place 1  tablet (0.4 mg total) under the tongue every 5 (five) minutes as needed for chest pain.   NOVOTWIST 32G X 5 MM Misc Generic drug:  Insulin Pen Needle USE ONE PER DAY TO INJECT VICTOZA   ONETOUCH DELICA LANCETS 71I Misc Use to check blood sugar 2 times per day dx code E11.49   potassium chloride 10 MEQ tablet Commonly known as:  KLOR-CON M10 Take 4 tablets (40 mEq total) by mouth daily.   Pramipexole Dihydrochloride 1.5 MG Tb24 Commonly known as:  MIRAPEX ER Take 1 tablet (1.5 mg total) by mouth daily.   rosuvastatin 20 MG tablet Commonly known as:  CRESTOR Take 1 tablet (20 mg total) by mouth daily.   traMADol 50 MG tablet Commonly known as:  ULTRAM TAKE 1 TABLET BY MOUTH TWICE A DAY AS NEEDED   travoprost (benzalkonium) 0.004 % ophthalmic solution Commonly known as:  TRAVATAN Place 1 drop into the right eye at bedtime.   trihexyphenidyl 2 MG tablet Commonly known as:  ARTANE TAKE 1 TABLET TWICE A DAY   V-GO 20 Kit USE ONE PER DAY   VICTOZA 18 MG/3ML Sopn Generic drug:  liraglutide INJECT 1.'8MG'$  ONCE DAILY   vitamin B-12 1000 MCG tablet Commonly known as:  CYANOCOBALAMIN Take 1,000 mcg by mouth daily.        PAST MEDICAL HISTORY:   Past Medical History:  Diagnosis Date  . AICD (automatic cardioverter/defibrillator) present    Dr Lovena Le office visit yearly  . Arthritis    cane  . Asthma   . Benign neoplasm of colon   . CAD (coronary artery disease)   . Cardiomyopathy   . Complication of anesthesia    pt states that he got a rash  . Constipation   . 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 DM (diabetes mellitus) ([place])    TYPE 2   . GERD (gastroesophageal reflux disease)   . Glaucoma    right eye  . HLD (hyperlipidemia)   . HTN (hypertension)    pt denies 08/19/12  . Hyperplasia, prostate   . Hyperthyroidism    thyroid lobectomy  . MI (myocardial infarction)  Dr Stanford Breed 2000, x3vessels bypass  . Nephrolithiasis   . OSA  (obstructive sleep apnea)    AHI-28,on CPAP, noncompliant with CPAP  . Parkinson disease ([place])    1999  . PONV (postoperative nausea and vomiting)   . Restless legs   . Shortness of breath    Hx: of at all times  . Sleep apnea    does not use CPAP  . Sleep apnea, organic   . UTI (lower urinary tract infection) 09/15/12   Klebsiella  . Ventral hernia     PAST SURGICAL HISTORY:   Past Surgical History:  Procedure Laterality Date  . 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 CARDIAC CATHETERIZATION    . CATARACT EXTRACTION W/ INTRAOCULAR LENS IMPLANT     Hx: of right eye  . COLONOSCOPY N/A 10/13/2014   Procedure: COLONOSCOPY;  Surgeon: Jerene Bears, MD;  Location: [facility];  Service: Gastroenterology;  Laterality: N/A;  . COLONOSCOPY W/ BIOPSIES AND POLYPECTOMY     Hx: of  . CORONARY ARTERY BYPASS GRAFT  2000   [name], MD  . [place]  . DEEP BRAIN STIMULATOR PLACEMENT  2004   Right and left VIN stimulator placement (parkinsons)  . EYE SURGERY    . FINGER AMPUTATION     left pointer  . IMPLANTABLE CARDIOVERTER DEFIBRILLATOR IMPLANT N/A 11/13/2013   Procedure: IMPLANTABLE CARDIOVERTER DEFIBRILLATOR IMPLANT;  Surgeon: Evans Lance, MD;  Location: [facility] C[facility] LAB;  Service: Cardiovascular;  Laterality: N/A;  . INSERT / REPLACE / REMOVE PACEMAKER    . LEFT AND RIGHT HEART CATHETERIZATION WITH CORONARY ANGIOGRAM N/A 09/24/2013   Procedure: LEFT AND RIGHT HEART CATHETERIZATION WITH CORONARY ANGIOGRAM;  Surgeon: Burnell Blanks, MD;  Location: [facility] CATH LAB;  Service: Cardiovascular;  Laterality: N/A;  . LITHOTRIPSY     3 different times  . MEDIAN STERNOTOMY  2000  . SUBTHALAMIC STIMULATOR BATTERY REPLACEMENT N/A 09/05/2012   Procedure: Deep brain stimulator battery change;  Surgeon: Erline Levine, MD;  Location: [place] [facility];  Service: Neurosurgery;  Laterality: N/A;  Deep brain stimulator battery change  . SUBTHALAMIC STIMULATOR BATTERY  REPLACEMENT N/A 06/10/2015   Procedure: Deep Brain stimulator battery change;  Surgeon: Erline Levine, MD;  Location: [place] [facility];  Service: Neurosurgery;  Laterality: N/A;  . TONSILLECTOMY      SOCIAL HISTORY:   Social History   Social History  . Marital status: Married    Spouse name: [name]  . Number of children: 2  . Years of education: N/A   Occupational History  . 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 Smoking status: Never Smoker  . Smokeless tobacco: Never Used  . Alcohol use Yes     Comment: occasional beer  . Drug use: No  . Sexual activity: Not on file   Other Topics Concern  . Not on file   Social History Narrative   From [facility]   Retired/disability Clinical research associate   Likes to fish.     Married 1972   3 kids   [employer] fan    FAMILY HISTORY:   Family Status  Relation Status  . Mother Deceased   complications of surgery  . Father Deceased   EtOHism  . Brother Alive   1 brother, 1 half brother  . Sister Alive   2 half sisters  . Child Alive   3, alive and well  .    Marland Kitchen  Maternal Grandmother Deceased  . Maternal Grandfather Deceased  . Paternal Grandmother Deceased  . Paternal Grandfather Deceased  . Neg Hx     ROS:  A complete 10 system review of systems was obtained and was unremarkable apart from what is mentioned above.  PHYSICAL EXAMINATION:    VITALS:   There were no vitals filed for this visit.  GEN:  The patient appears stated age and is in NAD. HEENT:  Normocephalic, atraumatic.  The mucous membranes are moist. The superficial temporal arteries are without ropiness or tenderness. CV:  RRR Lungs:  CTAB Neck/HEME:  There are no carotid bruits bilaterally.   Neurological examination:  Orientation: The patient is alert and oriented x3.  Cranial nerves: There is good facial symmetry.  The visual fields are full to confrontational testing. The speech is fluent and just mildly dysarthric.  He  is hypophonic. Soft palate rises symmetrically and there is no tongue deviation. Hearing is intact to conversational tone. Motor: Strength is 5/5 in the bilateral upper and lower extremities.   Shoulder shrug is equal and symmetric.  There is no pronator drift.  Movement examination: Tone: There is normal tone in the UE and LE bilaterally Abnormal movements: There is no tremor today Coordination:  There is slight decremation with finger taps and alternation of supination/pronation of the forearm b/l Gait and Station: The patient has minimal difficulty arising out of a deep-seated chair without the use of the hands. The patient's stride length is decreased and he shuffles a little and starts to festinate.    LABS  Lab Results  Component Value Date   WBC 6.5 06/10/2015   HGB 12.5 (L) 06/10/2015   HCT 39.0 06/10/2015   MCV 85.5 06/10/2015   PLT 163 06/10/2015   Lab Results  Component Value Date   TSH 3.58 09/16/2015     Chemistry      Component Value Date/Time   NA 140 12/10/2015 1144   K 3.2 (L) 12/10/2015 1144   CL 90 (L) 12/10/2015 1144   CO2 37 (H) 12/10/2015 1144   BUN 30 (H) 12/10/2015 1144   CREATININE 1.64 (H) 12/10/2015 1144      Component Value Date/Time   CALCIUM 9.7 12/10/2015 1144   ALKPHOS 63 09/16/2015 0857   AST 15 09/16/2015 0857   ALT 33 09/16/2015 0857   BILITOT 0.3 09/16/2015 0857     Lab Results  Component Value Date   VITAMINB12 268 08/19/2012   Lab Results  Component Value Date   HGBA1C 6.9 (H) 09/16/2015   DBS programming was performed today, which is described in more detail on a separate programming procedural notes. No tremor after programming  ASSESSMENT/PLAN:  1.  Idiopathic Parkinson's disease.    -I again talked to the patient about the Artane, which he is taking 2 mg twice a day.  I expressed to him that this can cause loss of balance and confusion.  However, when we tried to stop it in the past, he was not able to tolerate tremor.     -continue Mirapex ER 1.5 mg daily. Expensive but he was unable to do tid dosing of other so wife said to continue this  -His DBS battery was last changed on 09/05/2012 and on 06/10/15.    -Told him that he really needs to use his walker at all times for the festinating gait.  This is really the reason he is falling.  Is   -[facility] that aldactone interacted with PD  meds.  Told him and cardiology PA that no meds I give interact with aldactone if needed in future. 2.  RBD  -He will continue his clonazepam 0.5 mg, to 1-1/2 tablets per night .  Currently taking at 6 pm and wonder if that is why still having vivid dreams and falling out of bed.  Move to qhs. 3.  Pseudobulbar affect (crying)  -Talked to him about the nature and pathophysiology.  Really does not want any medication for this.  We'll limit me know if he changes his mind. 4.  OSAS, noncompliant with CPAP.  -Unfortunately, he has been unable to tolerate CPAP 5.  Sialorrhea  -This is associated with Parkinson's disease.  He may benefit from Myobloc in the future. 6.  Dysphagia.  -Overall, this is fairly mild but I will give an eye on this and consider a MBE if needed. 7.  CHF  -ICD placed on 11/15/2013 8.  Constipation  -copy of rancho recipe given although he still has the problem.  Dr. Damita Dunnings managing.  Better with Miralax and dates. 9.  Uncontrolled DM  -Doing much better with insulin pump 10.  Follow-up in the next few months, sooner should new neurologic issues arise.   Much greater than 50% of this visit was spent in counseling and coordinating care.  Total face to face time:  25 min which didn't include DBS time

## 2015-12-17 ENCOUNTER — Ambulatory Visit (INDEPENDENT_AMBULATORY_CARE_PROVIDER_SITE_OTHER): Payer: Medicare Other | Admitting: Neurology

## 2015-12-17 ENCOUNTER — Encounter: Payer: Self-pay | Admitting: Neurology

## 2015-12-17 VITALS — BP 122/62 | HR 90 | Ht 69.0 in | Wt 263.0 lb

## 2015-12-17 DIAGNOSIS — Z9689 Presence of other specified functional implants: Secondary | ICD-10-CM

## 2015-12-17 DIAGNOSIS — G4752 REM sleep behavior disorder: Secondary | ICD-10-CM | POA: Diagnosis not present

## 2015-12-17 DIAGNOSIS — G20 Parkinson's disease: Secondary | ICD-10-CM

## 2015-12-17 DIAGNOSIS — G2581 Restless legs syndrome: Secondary | ICD-10-CM | POA: Diagnosis not present

## 2015-12-17 DIAGNOSIS — R296 Repeated falls: Secondary | ICD-10-CM | POA: Diagnosis not present

## 2015-12-17 DIAGNOSIS — M6281 Muscle weakness (generalized): Secondary | ICD-10-CM | POA: Diagnosis not present

## 2015-12-17 DIAGNOSIS — M545 Low back pain: Secondary | ICD-10-CM | POA: Diagnosis not present

## 2015-12-17 DIAGNOSIS — R2689 Other abnormalities of gait and mobility: Secondary | ICD-10-CM | POA: Diagnosis not present

## 2015-12-17 DIAGNOSIS — I255 Ischemic cardiomyopathy: Secondary | ICD-10-CM

## 2015-12-17 NOTE — Patient Instructions (Signed)
1.  Take klonopin at bedtime and not at 6 pm 2.  You don't need to restart aldactone unless Dr. Stanford Breed instructs you to do so at next visit 3.  Follow up in 6 months 4.  Merry Christmas!

## 2015-12-17 NOTE — Procedures (Signed)
DBS Programming was performed.    Total time spent programming was 20 minutes.  Device was confirmed to be on.  Soft start was confirmed to be on.  Impedences were checked and were within normal limits.  Battery was checked and was determined to be functionally normally and not near end of life.    Detailed analysis on separate neurophysiologic worksheet.    Final settings were as follows:  Left brain electrode:     1-2+           ; Amplitude  4.0   V   ; Pulse width 90 microseconds;   Frequency   150   Hz.  Right brain electrode:     4-7+          ; Amplitude   4.3  V ;  Pulse width 90  microseconds;  Frequency   150    Hz.

## 2015-12-21 DIAGNOSIS — R2689 Other abnormalities of gait and mobility: Secondary | ICD-10-CM | POA: Diagnosis not present

## 2015-12-21 DIAGNOSIS — R296 Repeated falls: Secondary | ICD-10-CM | POA: Diagnosis not present

## 2015-12-21 DIAGNOSIS — M545 Low back pain: Secondary | ICD-10-CM | POA: Diagnosis not present

## 2015-12-21 DIAGNOSIS — M6281 Muscle weakness (generalized): Secondary | ICD-10-CM | POA: Diagnosis not present

## 2015-12-23 DIAGNOSIS — R2689 Other abnormalities of gait and mobility: Secondary | ICD-10-CM | POA: Diagnosis not present

## 2015-12-23 DIAGNOSIS — M545 Low back pain: Secondary | ICD-10-CM | POA: Diagnosis not present

## 2015-12-23 DIAGNOSIS — M6281 Muscle weakness (generalized): Secondary | ICD-10-CM | POA: Diagnosis not present

## 2015-12-23 DIAGNOSIS — R296 Repeated falls: Secondary | ICD-10-CM | POA: Diagnosis not present

## 2015-12-26 ENCOUNTER — Other Ambulatory Visit: Payer: Self-pay | Admitting: Family Medicine

## 2015-12-26 ENCOUNTER — Other Ambulatory Visit: Payer: Self-pay | Admitting: Endocrinology

## 2015-12-26 DIAGNOSIS — Z794 Long term (current) use of insulin: Principal | ICD-10-CM

## 2015-12-26 DIAGNOSIS — E1165 Type 2 diabetes mellitus with hyperglycemia: Secondary | ICD-10-CM

## 2015-12-27 ENCOUNTER — Other Ambulatory Visit (INDEPENDENT_AMBULATORY_CARE_PROVIDER_SITE_OTHER): Payer: Medicare Other

## 2015-12-27 DIAGNOSIS — Z794 Long term (current) use of insulin: Secondary | ICD-10-CM | POA: Diagnosis not present

## 2015-12-27 DIAGNOSIS — E1165 Type 2 diabetes mellitus with hyperglycemia: Secondary | ICD-10-CM

## 2015-12-27 LAB — URINALYSIS, ROUTINE W REFLEX MICROSCOPIC
Bilirubin Urine: NEGATIVE
KETONES UR: NEGATIVE
Leukocytes, UA: NEGATIVE
Nitrite: NEGATIVE
PH: 6 (ref 5.0–8.0)
SPECIFIC GRAVITY, URINE: 1.015 (ref 1.000–1.030)
Total Protein, Urine: NEGATIVE
URINE GLUCOSE: NEGATIVE
UROBILINOGEN UA: 0.2 (ref 0.0–1.0)

## 2015-12-27 LAB — COMPREHENSIVE METABOLIC PANEL
ALBUMIN: 4.1 g/dL (ref 3.5–5.2)
ALT: 24 U/L (ref 0–53)
AST: 14 U/L (ref 0–37)
Alkaline Phosphatase: 65 U/L (ref 39–117)
BUN: 22 mg/dL (ref 6–23)
CHLORIDE: 92 meq/L — AB (ref 96–112)
CO2: 33 meq/L — AB (ref 19–32)
Calcium: 9.3 mg/dL (ref 8.4–10.5)
Creatinine, Ser: 1.52 mg/dL — ABNORMAL HIGH (ref 0.40–1.50)
GFR: 49.26 mL/min — ABNORMAL LOW (ref >60.00)
Glucose, Bld: 248 mg/dL — ABNORMAL HIGH (ref 70–99)
POTASSIUM: 3.3 meq/L — AB (ref 3.5–5.1)
SODIUM: 138 meq/L (ref 135–145)
Total Bilirubin: 0.4 mg/dL (ref 0.2–1.2)
Total Protein: 6.5 g/dL (ref 6.0–8.3)

## 2015-12-27 LAB — MICROALBUMIN / CREATININE URINE RATIO
CREATININE, U: 86.8 mg/dL
Microalb Creat Ratio: 1.3 mg/g (ref 0.0–30.0)
Microalb, Ur: 1.1 mg/dL (ref 0.0–1.9)

## 2015-12-27 LAB — HEMOGLOBIN A1C: Hgb A1c MFr Bld: 8 % — ABNORMAL HIGH (ref 4.6–6.5)

## 2015-12-28 DIAGNOSIS — R2689 Other abnormalities of gait and mobility: Secondary | ICD-10-CM | POA: Diagnosis not present

## 2015-12-28 DIAGNOSIS — M6281 Muscle weakness (generalized): Secondary | ICD-10-CM | POA: Diagnosis not present

## 2015-12-28 DIAGNOSIS — R296 Repeated falls: Secondary | ICD-10-CM | POA: Diagnosis not present

## 2015-12-28 DIAGNOSIS — M545 Low back pain: Secondary | ICD-10-CM | POA: Diagnosis not present

## 2015-12-30 ENCOUNTER — Encounter: Payer: Self-pay | Admitting: Endocrinology

## 2015-12-30 ENCOUNTER — Ambulatory Visit (INDEPENDENT_AMBULATORY_CARE_PROVIDER_SITE_OTHER): Payer: Medicare Other | Admitting: Endocrinology

## 2015-12-30 VITALS — BP 112/62 | HR 78 | Ht 69.0 in | Wt 254.0 lb

## 2015-12-30 DIAGNOSIS — Z794 Long term (current) use of insulin: Secondary | ICD-10-CM

## 2015-12-30 DIAGNOSIS — I255 Ischemic cardiomyopathy: Secondary | ICD-10-CM | POA: Diagnosis not present

## 2015-12-30 DIAGNOSIS — R2689 Other abnormalities of gait and mobility: Secondary | ICD-10-CM | POA: Diagnosis not present

## 2015-12-30 DIAGNOSIS — M6281 Muscle weakness (generalized): Secondary | ICD-10-CM | POA: Diagnosis not present

## 2015-12-30 DIAGNOSIS — E890 Postprocedural hypothyroidism: Secondary | ICD-10-CM

## 2015-12-30 DIAGNOSIS — M545 Low back pain: Secondary | ICD-10-CM | POA: Diagnosis not present

## 2015-12-30 DIAGNOSIS — E1165 Type 2 diabetes mellitus with hyperglycemia: Secondary | ICD-10-CM

## 2015-12-30 DIAGNOSIS — E782 Mixed hyperlipidemia: Secondary | ICD-10-CM

## 2015-12-30 DIAGNOSIS — R296 Repeated falls: Secondary | ICD-10-CM | POA: Diagnosis not present

## 2015-12-30 NOTE — Progress Notes (Signed)
Patient ID: Henry [name], male   DOB: 11-02-1950, 65 y.o.   MRN: [id number]           Reason for Appointment: Follow-up for Type 2 Diabetes   History of Present Illness:          Diagnosis: Type 2 diabetes mellitus, date of diagnosis: 2000      Past history:   Patient thinks he has been taking Amaryl for several years and probably metformin since onset also At some point he was changed from metformin to [facility] and was taking this since at least 2012 A1c had been higher in 2015 Since his A1c had been progressively higher with his regimen of Janumet and Amaryl he was started on Victoza in 01/2014 He was started on Lantus insulin in 5/16 because of persistent hyperglycemia especially fasting; was having readings as high as 293 Because of tendency to high postprandial readings and for control with Lantus he was switched to the V-go pump in  09/2014  Recent history:   INSULIN regimen: V-go-20 pump and boluses 4-8 units; 6-8 at dinner  Non-insulin hypoglycemic drugs the patient is taking are: Metformin 1 g twice a day, Amaryl 4 mg a.m., 2 mg p.m., Victoza 1.8 mg daily    His A1c has been previously better with the V-go pump However A1c is now 8%, previously has low 6.2  Current management, blood sugar patterns and problems identified:  He is now apparently saying that he does not change his V-go pump until the insulin runs out and this could be any time of the day or night  Also he thinks that occasionally he would run out of clicks on his pump but still would have some insulin left over and he tried to discuss this with the company and did not get a good response  As a result his fasting blood sugars have been mostly high  More recently has tried to change the pump more consistently and morning readings are appearing better  Also it is difficult to know what he is doing as he has a somewhat rambling history   He was told to take his mealtime insulin clicks consistently based on  what he is eating but he is mostly taking 6 units  Did not take extra if he is eating the dessert or large snack  On average his highest blood sugars are late morning or after supper but again mostly checking blood sugars in the morning hours and sporadically later in the day.  His morning meals are at variable times.  He has lost weight and not care of this is related to higher blood sugars or change in edema level  He has seen the dietitian in 3/16 Not able to exercise        Side effects from medications have been: None Compliance with the medical regimen: Fair  Hypoglycemia:   none  Glucose monitoring:  done 1-2 times  a day         Glucometer: One Touch ultra 2 .      Blood Glucose readings by   Mean values apply above for all meters except median for One Touch  PRE-MEAL Fasting Lunch Dinner Bedtime Overall  Glucose range: 125-290    87-295    Mean/median: 185   136-214  200  176    Self-care: The diet that the patient has been following is:  tries to eat more vegetables, some fried food and cookies, mostly sugar-free  Meals: 9am , 2-3 pm and 7 pm     Exercise: unable to do any         Dietician visit, most recent: 03/2014                Weight history:   Wt Readings from Last 3 Encounters:  12/30/15 254 lb (115.2 kg)  12/17/15 263 lb (119.3 kg)  11/26/15 257 lb 8 oz (116.8 kg)    Glycemic control:   Lab Results  Component Value Date   HGBA1C 8.0 (H) 12/27/2015   HGBA1C 6.9 (H) 09/16/2015   HGBA1C 6.2 06/21/2015   Lab Results  Component Value Date   MICROALBUR 1.1 12/27/2015   LDLCALC 118 (H) 09/16/2015   CREATININE 1.52 (H) 12/27/2015   Other active problems: See review of systems  Lab on 12/27/2015  Component Date Value Ref Range Status  . Hgb A1c MFr Bld 12/27/2015 8.0* 4.6 - 6.5 % Final  . Sodium 12/27/2015 138  135 - 145 mEq/L Final  . Potassium 12/27/2015 3.3* 3.5 - 5.1 mEq/L Final  . Chloride 12/27/2015 92* 96 - 112 mEq/L Final  . CO2  12/27/2015 33* 19 - 32 mEq/L Final  . Glucose, Bld 12/27/2015 248* 70 - 99 mg/dL Final  . BUN 12/27/2015 22  6 - 23 mg/dL Final  . Creatinine, Ser 12/27/2015 1.52* 0.40 - 1.50 mg/dL Final  . Total Bilirubin 12/27/2015 0.4  0.2 - 1.2 mg/dL Final  . Alkaline Phosphatase 12/27/2015 65  39 - 117 U/L Final  . AST 12/27/2015 14  0 - 37 U/L Final  . ALT 12/27/2015 24  0 - 53 U/L Final  . Total Protein 12/27/2015 6.5  6.0 - 8.3 g/dL Final  . Albumin 12/27/2015 4.1  3.5 - 5.2 g/dL Final  . Calcium 12/27/2015 9.3  8.4 - 10.5 mg/dL Final  . GFR 12/27/2015 49.26* >60.00 mL/min Final  . Microalb, Ur 12/27/2015 1.1  0.0 - 1.9 mg/dL Final  . Creatinine,U 12/27/2015 86.8  mg/dL Final  . Microalb Creat Ratio 12/27/2015 1.3  0.0 - 30.0 mg/g Final  . Color, Urine 12/27/2015 YELLOW  Yellow;Lt. Yellow Final  . APPearance 12/27/2015 CLEAR  Clear Final  . Specific Gravity, Urine 12/27/2015 1.015  1.000 - 1.030 Final  . pH 12/27/2015 6.0  5.0 - 8.0 Final  . Total Protein, Urine 12/27/2015 NEGATIVE  Negative Final  . Urine Glucose 12/27/2015 NEGATIVE  Negative Final  . Ketones, ur 12/27/2015 NEGATIVE  Negative Final  . Bilirubin Urine 12/27/2015 NEGATIVE  Negative Final  . Hgb urine dipstick 12/27/2015 SMALL* Negative Final  . Urobilinogen, UA 12/27/2015 0.2  0.0 - 1.0 Final  . Leukocytes, UA 12/27/2015 NEGATIVE  Negative Final  . Nitrite 12/27/2015 NEGATIVE  Negative Final  . WBC, UA 12/27/2015 0-2/hpf  0-2/hpf Final  . RBC / HPF 12/27/2015 0-2/hpf  0-2/hpf Final  . Squamous Epithelial / LPF 12/27/2015 Rare(0-4/hpf)  Rare(0-4/hpf) Final      Allergies as of 12/30/2015      Reactions   Penicillins Rash, Other (See Comments)   WEARS ALLERGY BRACELET Because of a history of documented adverse serious drug reaction;Medi Alert bracelet  is recommended Has patient had a PCN reaction causing immediate rash, facial/tongue/throat swelling, SOB or lightheadedness with hypotension: Yes Has patient had a PCN  reaction causing severe rash involving mucus membranes or skin necrosis: unknown Has patient had a PCN reaction that required hospitalization NO Has patient had a PCN reaction occurring within the last 10  years: NO If all of       Medication List       Accurate as of 12/30/15 11:59 PM. Always use your most recent med list.          acetaminophen 325 MG tablet Commonly known as:  TYLENOL Take 1-2 tablets (325-650 mg total) by mouth every 4 (four) hours as needed for mild pain.   albuterol 108 (90 Base) MCG/ACT inhaler Commonly known as:  PROVENTIL HFA;VENTOLIN HFA Inhale 2 puffs into the lungs every 6 (six) hours as needed for wheezing or shortness of breath.   aspirin EC 81 MG tablet Take 81 mg by mouth daily.   clonazePAM 0.5 MG tablet Commonly known as:  KLONOPIN TAKE 1 & 1/2 TABLETS BY MOUTH AT BEDTIME   Fluticasone-Salmeterol 250-50 MCG/DOSE Aepb Commonly known as:  ADVAIR Inhale 1 puff into the lungs 2 (two) times daily.   furosemide 80 MG tablet Commonly known as:  LASIX TAKE 1.5 TABLETS BY MOUTH EVERY DAY   glimepiride 4 MG tablet Commonly known as:  AMARYL TAKE 1 TABLET BY MOUTH EVERY DAY IN THE MORNING AND 1/2 AT SUPPER   glucose blood test strip Commonly known as:  ONETOUCH VERIO Use as instructed to check blood sugar 2 times a day Dx code E11.49   HUMALOG 100 UNIT/ML injection Generic drug:  insulin lispro USE MAX 56 UNITS PER DAY   WITH V-GO PUMP   ibuprofen 200 MG tablet Commonly known as:  ADVIL,MOTRIN Take 800 mg by mouth every 6 (six) hours as needed (For pain.).   levothyroxine 125 MCG tablet Commonly known as:  SYNTHROID, LEVOTHROID Take 1 tablet (125 mcg total) by mouth daily.   metFORMIN 1000 MG tablet Commonly known as:  GLUCOPHAGE TAKE 1 TABLET (1,000 MG TOTAL) BY MOUTH 2 (TWO) TIMES DAILY WITH A MEAL.   metolazone 2.5 MG tablet Commonly known as:  ZAROXOLYN Take 1 tablet on Tuesdays & Fridays ONLY; take 30 minutes before taking  Lasix   metoprolol succinate 50 MG 24 hr tablet Commonly known as:  TOPROL-XL TAKE 1 TABLET BY MOUTH DAILY. TAKE WITH OR IMMEDIATELY FOLLOWING A MEAL.   multivitamin with minerals Tabs tablet Take 1 tablet by mouth daily.   nitroGLYCERIN 0.4 MG SL tablet Commonly known as:  NITROSTAT Place 1 tablet (0.4 mg total) under the tongue every 5 (five) minutes as needed for chest pain.   NOVOTWIST 32G X 5 MM Misc Generic drug:  Insulin Pen Needle USE ONE PER DAY TO INJECT VICTOZA   ONETOUCH DELICA LANCETS 23J Misc Use to check blood sugar 2 times per day dx code E11.49   potassium chloride 10 MEQ tablet Commonly known as:  KLOR-CON M10 Take 4 tablets (40 mEq total) by mouth daily.   Pramipexole Dihydrochloride 1.5 MG Tb24 Commonly known as:  MIRAPEX ER Take 1 tablet (1.5 mg total) by mouth daily.   rosuvastatin 20 MG tablet Commonly known as:  CRESTOR Take 1 tablet (20 mg total) by mouth daily.   traMADol 50 MG tablet Commonly known as:  ULTRAM TAKE 1 TABLET BY MOUTH TWICE A DAY AS NEEDED   travoprost (benzalkonium) 0.004 % ophthalmic solution Commonly known as:  TRAVATAN Place 1 drop into the right eye at bedtime.   trihexyphenidyl 2 MG tablet Commonly known as:  ARTANE TAKE 1 TABLET TWICE A DAY   V-GO 20 Kit USE ONE PER DAY   VICTOZA 18 MG/3ML Sopn Generic drug:  liraglutide INJECT 1.'8MG'$  ONCE DAILY  vitamin B-12 1000 MCG tablet Commonly known as:  CYANOCOBALAMIN Take 1,000 mcg by mouth daily.       Allergies:  Allergies  Allergen Reactions  . Penicillins Rash and Other (See Comments)    WEARS ALLERGY BRACELET Because of a history of documented adverse serious drug reaction;Medi Alert bracelet  is recommended Has patient had a PCN reaction causing immediate rash, facial/tongue/throat swelling, SOB or lightheadedness with hypotension: Yes Has patient had a PCN reaction causing severe rash involving mucus membranes or skin necrosis: unknown Has patient had a  PCN reaction that required hospitalization NO Has patient had a PCN reaction occurring within the last 10 years: NO If all of     Past Medical History:  Diagnosis Date  . AICD (automatic cardioverter/defibrillator) present    Dr Lovena Le office visit yearly  . Arthritis    cane  . Asthma   . Benign neoplasm of colon   . CAD (coronary artery disease)   . Cardiomyopathy   . Complication of anesthesia    pt states that he got a rash  . Constipation   . 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 DM (diabetes mellitus) ([place])    TYPE 2   . GERD (gastroesophageal reflux disease)   . Glaucoma    right eye  . HLD (hyperlipidemia)   . HTN (hypertension)    pt denies 08/19/12  . Hyperplasia, prostate   . Hyperthyroidism    thyroid lobectomy  . MI (myocardial infarction)    Dr Stanford Breed 2000, x3vessels bypass  . Nephrolithiasis   . OSA (obstructive sleep apnea)    AHI-28,on CPAP, noncompliant with CPAP  . Parkinson disease ([place])    1999  . PONV (postoperative nausea and vomiting)   . Restless legs   . Shortness of breath    Hx: of at all times  . Sleep apnea    does not use CPAP  . Sleep apnea, organic   . UTI (lower urinary tract infection) 09/15/12   Klebsiella  . Ventral hernia     Past Surgical History:  Procedure Laterality Date  . 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 CARDIAC CATHETERIZATION    . CATARACT EXTRACTION W/ INTRAOCULAR LENS IMPLANT     Hx: of right eye  . COLONOSCOPY N/A 10/13/2014   Procedure: COLONOSCOPY;  Surgeon: Jerene Bears, MD;  Location: [facility];  Service: Gastroenterology;  Laterality: N/A;  . COLONOSCOPY W/ BIOPSIES AND POLYPECTOMY     Hx: of  . CORONARY ARTERY BYPASS GRAFT  2000   [name], MD  . [place]  . DEEP BRAIN STIMULATOR PLACEMENT  2004   Right and left VIN stimulator placement (parkinsons)  . EYE SURGERY    . FINGER AMPUTATION     left pointer  . IMPLANTABLE  CARDIOVERTER DEFIBRILLATOR IMPLANT N/A 11/13/2013   Procedure: IMPLANTABLE CARDIOVERTER DEFIBRILLATOR IMPLANT;  Surgeon: Evans Lance, MD;  Location: [facility] CATH LAB;  Service: Cardiovascular;  Laterality: N/A;  . INSERT / REPLACE / REMOVE PACEMAKER    . LEFT AND RIGHT HEART CATHETERIZATION WITH CORONARY ANGIOGRAM N/A 09/24/2013   Procedure: LEFT AND RIGHT HEART CATHETERIZATION WITH CORONARY ANGIOGRAM;  Surgeon: Burnell Blanks, MD;  Location: [facility] CATH LAB;  Service: Cardiovascular;  Laterality: N/A;  . LITHOTRIPSY     3 different times  . MEDIAN STERNOTOMY  2000  . SUBTHALAMIC STIMULATOR BATTERY REPLACEMENT N/A 09/05/2012  Procedure: Deep brain stimulator battery change;  Surgeon: Maeola Harman, MD;  Location: [facility]URO [facility];  Service: Neurosurgery;  Laterality: N/A;  Deep brain stimulator battery change  . SUBTHALAMIC STIMULATOR BATTERY REPLACEMENT N/A 06/10/2015   Procedure: Deep Brain stimulator battery change;  Surgeon: Maeola Harman, MD;  Location: [facility]URO [facility];  Service: Neurosurgery;  Laterality: N/A;  . TONSILLECTOMY      Family History  Problem Relation Age of Onset  . Aneurysm Mother   . Alcoholism Father   . Autoimmune disease Brother 35    AIDS  . Peripheral vascular disease    . Arthritis    . Diabetes Neg Hx   . Heart disease Neg Hx   . Colon cancer Neg Hx   . Prostate cancer Neg Hx     Social History:  reports that he has never smoked. He has never used smokeless tobacco. He reports that he drinks alcohol. He reports that he does not use drugs.    Review of Systems     Most recent eye exam was done in 2/17    HYPERLIPIDEMIA: He was switched from Lipitor to Crestor because of muscle aches with Lipitor and LDL is below 70 now      Lab Results  Component Value Date   CHOL 132 12/06/2015   CHOL 182 09/16/2015   CHOL 147 05/19/2014   Lab Results  Component Value Date   HDL 30.20 (L) 12/06/2015   HDL 31.30 (L) 09/16/2015   HDL 35.50 (L) 05/19/2014   Lab  Results  Component Value Date   LDLCALC 118 (H) 09/16/2015   LDLCALC 126 (H) 04/08/2013   LDLCALC 51 02/28/2012   Lab Results  Component Value Date   TRIG 217.0 (H) 12/06/2015   TRIG 165.0 (H) 09/16/2015   TRIG 213.0 (H) 05/19/2014   Lab Results  Component Value Date   CHOLHDL 4 12/06/2015   CHOLHDL 6 09/16/2015   CHOLHDL 4 05/19/2014   Lab Results  Component Value Date   LDLDIRECT 69.0 12/06/2015   LDLDIRECT 86.0 05/19/2014   LDLDIRECT 88.7 08/06/2013                  Thyroid:   He has had hypothyroidism following radioactive iodine treatment for hyperthyroidism in 2013  TSH is usually normal with  dosage of 125 g   Lab Results  Component Value Date   TSH 3.58 09/16/2015   TSH 2.06 12/15/2014   TSH 3.57 08/20/2014   FREET4 0.82 08/20/2014   FREET4 0.98 11/20/2011   FREET4 0.91 10/16/2011       The blood pressure has been followed by His other physicians, currently taking losartan and 25 mg Aldactone   Lab Results  Component Value Date   CREATININE 1.52 (H) 12/27/2015   BUN 22 12/27/2015   NA 138 12/27/2015   K 3.3 (L) 12/27/2015   CL 92 (L) 12/27/2015   CO2 33 (H) 12/27/2015     Diabetic foot exam in 10/16 showed  normal monofilament sensation in the toes except decreased on the right great toe and plantar surfaces, no skin lesions or ulcers on the feet and normal pedal pulses    Physical Examination:  BP 112/62   Pulse 78   Ht 5\' 9"  (1.753 m)   Wt 254 lb (115.2 kg)   SpO2 97%   BMI 37.51 kg/m      ASSESSMENT/PLAN  Diabetes type 2, uncontrolled with obesity    See history of present illness for  detailed discussion of his current management, blood sugar patterns and problems identified  His blood sugars are Not well controlled with A1c 8% This is mostly related to his difficulty understanding how to use the V-go pump Explained in detail the need to change the pump every 24 hours regardless of how much insulin is left lower He will contact  company if he is prematurely running out of clicks before using 18 clicks a day He has to adjust boluses based on what he is eating He will also take extra boluses for dessert enlarged snacks. Need to consistent bolusing before eating He will stay with the same basal rate for now  HYPERLIPIDEMIA increased LDL and low HDL. He needs high intensity statin Now LDL is better with Crestor and will continue  HYPERTENSION:  well controlled  Hypothyroidism:  Needs follow-up TSH on the next visit, previously Adequately replaced   Patient Instructions  CHANGE V-GO PUMP EVERY 24 HRS REGARDLESS of when insulin runs out  Click 3-4 times before each meal and 1-2 times before large snacks or sweets   More sugars 2-3 hrs after any meal    Counseling time on subjects discussed above is over 50% of today's 25 minute visit  Shaune Malacara 01/01/2016, 10:06 AM   Note: This office note was prepared with Estate agent. Any transcriptional errors that result from this process are unintentional.

## 2015-12-30 NOTE — Patient Instructions (Addendum)
CHANGE V-GO PUMP EVERY 24 HRS REGARDLESS of when insulin runs out  Click 3-4 times before each meal and 1-2 times before large snacks or sweets   More sugars 2-3 hrs after any meal

## 2016-01-04 ENCOUNTER — Other Ambulatory Visit: Payer: Self-pay | Admitting: Neurology

## 2016-01-04 NOTE — Progress Notes (Signed)
Called to reach patient regarding recommendations, no answer or VM pickup.

## 2016-01-05 ENCOUNTER — Telehealth: Payer: Self-pay | Admitting: *Deleted

## 2016-01-05 DIAGNOSIS — M545 Low back pain: Secondary | ICD-10-CM | POA: Diagnosis not present

## 2016-01-05 DIAGNOSIS — R296 Repeated falls: Secondary | ICD-10-CM | POA: Diagnosis not present

## 2016-01-05 DIAGNOSIS — M6281 Muscle weakness (generalized): Secondary | ICD-10-CM | POA: Diagnosis not present

## 2016-01-05 DIAGNOSIS — R2689 Other abnormalities of gait and mobility: Secondary | ICD-10-CM | POA: Diagnosis not present

## 2016-01-05 NOTE — Progress Notes (Signed)
I have called and left VM msg for patient to call regarding Luke's recommendations.

## 2016-01-05 NOTE — Telephone Encounter (Signed)
Erlene Quan, PA-C  Elayne Snare, MD  Cc: Theodore Demark, RN        I would suggest increasing his K+ to 40 meq BID on the days he takes his Zaroxolyn. We will let the pt know.   Kerin Ransom PA-C  12/30/2015  4:43 PM     Luke's addendum to Dr. Ronnie Derby chart notes.  I have left VM for patient to call.

## 2016-01-06 NOTE — Telephone Encounter (Signed)
New Message   Patient wife returning phone call from [place].

## 2016-01-06 NOTE — Telephone Encounter (Signed)
Received return call from wife, spoke w her and w patient and communicated recommendations regarding the potassium increase. This was thoroughly explained, patient expressed understanding, and had no further questions. He and wife are aware to call should they have other concerns or inquiries.

## 2016-01-06 NOTE — Telephone Encounter (Signed)
Left msg for Archie Patten to call.

## 2016-01-07 DIAGNOSIS — M545 Low back pain: Secondary | ICD-10-CM | POA: Diagnosis not present

## 2016-01-07 DIAGNOSIS — M6281 Muscle weakness (generalized): Secondary | ICD-10-CM | POA: Diagnosis not present

## 2016-01-07 DIAGNOSIS — R2689 Other abnormalities of gait and mobility: Secondary | ICD-10-CM | POA: Diagnosis not present

## 2016-01-07 DIAGNOSIS — R296 Repeated falls: Secondary | ICD-10-CM | POA: Diagnosis not present

## 2016-01-10 HISTORY — PX: CATARACT EXTRACTION W/ INTRAOCULAR LENS IMPLANT: SHX1309

## 2016-01-11 ENCOUNTER — Encounter: Payer: Self-pay | Admitting: Neurology

## 2016-01-11 ENCOUNTER — Other Ambulatory Visit: Payer: Self-pay | Admitting: Endocrinology

## 2016-01-11 MED ORDER — PRAMIPEXOLE DIHYDROCHLORIDE ER 1.5 MG PO TB24: 1.5000 mg | ORAL_TABLET | Freq: Every day | ORAL | 1 refills | Status: DC

## 2016-01-18 DIAGNOSIS — M545 Low back pain: Secondary | ICD-10-CM | POA: Diagnosis not present

## 2016-01-18 DIAGNOSIS — R296 Repeated falls: Secondary | ICD-10-CM | POA: Diagnosis not present

## 2016-01-18 DIAGNOSIS — R2689 Other abnormalities of gait and mobility: Secondary | ICD-10-CM | POA: Diagnosis not present

## 2016-01-18 DIAGNOSIS — M6281 Muscle weakness (generalized): Secondary | ICD-10-CM | POA: Diagnosis not present

## 2016-01-20 ENCOUNTER — Other Ambulatory Visit: Payer: Self-pay | Admitting: *Deleted

## 2016-01-20 DIAGNOSIS — R296 Repeated falls: Secondary | ICD-10-CM | POA: Diagnosis not present

## 2016-01-20 DIAGNOSIS — M6281 Muscle weakness (generalized): Secondary | ICD-10-CM | POA: Diagnosis not present

## 2016-01-20 DIAGNOSIS — R2689 Other abnormalities of gait and mobility: Secondary | ICD-10-CM | POA: Diagnosis not present

## 2016-01-20 DIAGNOSIS — M545 Low back pain: Secondary | ICD-10-CM | POA: Diagnosis not present

## 2016-01-20 MED ORDER — POTASSIUM CHLORIDE CRYS ER 10 MEQ PO TBCR: 40.0000 meq | EXTENDED_RELEASE_TABLET | Freq: Every day | ORAL | 0 refills | Status: DC

## 2016-01-25 ENCOUNTER — Ambulatory Visit (INDEPENDENT_AMBULATORY_CARE_PROVIDER_SITE_OTHER): Payer: Medicare Other | Admitting: *Deleted

## 2016-01-25 DIAGNOSIS — R2689 Other abnormalities of gait and mobility: Secondary | ICD-10-CM | POA: Diagnosis not present

## 2016-01-25 DIAGNOSIS — I255 Ischemic cardiomyopathy: Secondary | ICD-10-CM | POA: Diagnosis not present

## 2016-01-25 DIAGNOSIS — M6281 Muscle weakness (generalized): Secondary | ICD-10-CM | POA: Diagnosis not present

## 2016-01-25 DIAGNOSIS — R296 Repeated falls: Secondary | ICD-10-CM | POA: Diagnosis not present

## 2016-01-25 DIAGNOSIS — I5022 Chronic systolic (congestive) heart failure: Secondary | ICD-10-CM

## 2016-01-25 DIAGNOSIS — M545 Low back pain: Secondary | ICD-10-CM | POA: Diagnosis not present

## 2016-01-25 NOTE — Progress Notes (Signed)
Remote ICD transmission.   

## 2016-01-28 ENCOUNTER — Other Ambulatory Visit: Payer: Self-pay | Admitting: Family Medicine

## 2016-01-29 LAB — CUP PACEART REMOTE DEVICE CHECK
Battery Remaining Longevity: 114 mo
Battery Voltage: 3.01 V
Brady Statistic AP VP Percent: 0.01 %
Brady Statistic AP VS Percent: 2.4 %
Brady Statistic AS VP Percent: 0.05 %
Brady Statistic AS VS Percent: 97.55 %
Brady Statistic RA Percent Paced: 2.38 %
Brady Statistic RV Percent Paced: 0.05 %
Date Time Interrogation Session: 20180116062504
HighPow Impedance: 81 Ohm
Implantable Lead Implant Date: 20151105
Implantable Lead Implant Date: 20151105
Implantable Lead Location: 753859
Implantable Lead Location: 753860
Implantable Lead Model: 5076
Implantable Pulse Generator Implant Date: 20151105
Lead Channel Impedance Value: 437 Ohm
Lead Channel Impedance Value: 437 Ohm
Lead Channel Impedance Value: 494 Ohm
Lead Channel Pacing Threshold Amplitude: 0.5 V
Lead Channel Pacing Threshold Amplitude: 0.875 V
Lead Channel Pacing Threshold Pulse Width: 0.4 ms
Lead Channel Pacing Threshold Pulse Width: 0.4 ms
Lead Channel Sensing Intrinsic Amplitude: 18.75 mV
Lead Channel Sensing Intrinsic Amplitude: 18.75 mV
Lead Channel Sensing Intrinsic Amplitude: 2.5 mV
Lead Channel Sensing Intrinsic Amplitude: 2.5 mV
Lead Channel Setting Pacing Amplitude: 2 V
Lead Channel Setting Pacing Amplitude: 2.5 V
Lead Channel Setting Pacing Pulse Width: 0.4 ms
Lead Channel Setting Sensing Sensitivity: 0.3 mV

## 2016-01-29 NOTE — Telephone Encounter (Signed)
Last filled 09/02/15 with 3 refills--please advise

## 2016-01-30 NOTE — Telephone Encounter (Signed)
Please call in.  Thanks.   

## 2016-01-31 NOTE — Telephone Encounter (Signed)
Tramadol called into [id number] [place], [place] - [address] AT [name] Phone: [phone]

## 2016-02-01 DIAGNOSIS — R2689 Other abnormalities of gait and mobility: Secondary | ICD-10-CM | POA: Diagnosis not present

## 2016-02-01 DIAGNOSIS — M6281 Muscle weakness (generalized): Secondary | ICD-10-CM | POA: Diagnosis not present

## 2016-02-01 DIAGNOSIS — R296 Repeated falls: Secondary | ICD-10-CM | POA: Diagnosis not present

## 2016-02-01 DIAGNOSIS — M545 Low back pain: Secondary | ICD-10-CM | POA: Diagnosis not present

## 2016-02-02 ENCOUNTER — Encounter: Payer: Self-pay | Admitting: Cardiology

## 2016-02-03 DIAGNOSIS — R296 Repeated falls: Secondary | ICD-10-CM | POA: Diagnosis not present

## 2016-02-03 DIAGNOSIS — R2689 Other abnormalities of gait and mobility: Secondary | ICD-10-CM | POA: Diagnosis not present

## 2016-02-03 DIAGNOSIS — M545 Low back pain: Secondary | ICD-10-CM | POA: Diagnosis not present

## 2016-02-03 DIAGNOSIS — M6281 Muscle weakness (generalized): Secondary | ICD-10-CM | POA: Diagnosis not present

## 2016-02-05 ENCOUNTER — Other Ambulatory Visit: Payer: Self-pay | Admitting: Endocrinology

## 2016-02-05 ENCOUNTER — Other Ambulatory Visit: Payer: Self-pay | Admitting: Neurology

## 2016-02-08 DIAGNOSIS — R2689 Other abnormalities of gait and mobility: Secondary | ICD-10-CM | POA: Diagnosis not present

## 2016-02-08 DIAGNOSIS — M6281 Muscle weakness (generalized): Secondary | ICD-10-CM | POA: Diagnosis not present

## 2016-02-08 DIAGNOSIS — M545 Low back pain: Secondary | ICD-10-CM | POA: Diagnosis not present

## 2016-02-08 DIAGNOSIS — R296 Repeated falls: Secondary | ICD-10-CM | POA: Diagnosis not present

## 2016-02-09 ENCOUNTER — Encounter: Payer: Self-pay | Admitting: Family Medicine

## 2016-02-10 ENCOUNTER — Telehealth: Payer: Self-pay | Admitting: Family Medicine

## 2016-02-10 DIAGNOSIS — M545 Low back pain: Secondary | ICD-10-CM | POA: Diagnosis not present

## 2016-02-10 DIAGNOSIS — R2689 Other abnormalities of gait and mobility: Secondary | ICD-10-CM | POA: Diagnosis not present

## 2016-02-10 DIAGNOSIS — M6281 Muscle weakness (generalized): Secondary | ICD-10-CM | POA: Diagnosis not present

## 2016-02-10 DIAGNOSIS — R296 Repeated falls: Secondary | ICD-10-CM | POA: Diagnosis not present

## 2016-02-10 NOTE — Telephone Encounter (Signed)
mychart message pasted below.  I don't see this documented in the EMR about the K dosing- please verify this dosing instruction in the message and please either send in the rx or send it back to me with verification to send in.  Thanks.     ----- Message from Henry [name] to Tonia Ghent, MD sent at 02/09/2016 4:46 PM -----   Henry [name] 24-Dec-1950. Dr. Jacalyn Lefevre office (Dr. Lurena Joiner) requested that [facility] take 8 Klor-Con tablets on Tuesday and Friday to increase his potassium. We need a refill [facility] to [facility] [place] with prescription instructions changes so the insurance company will let them refill. Thank you.

## 2016-02-10 NOTE — Telephone Encounter (Signed)
I'll take care of it  Kerin Ransom PA-C 02/10/2016 12:59 PM

## 2016-02-10 NOTE — Telephone Encounter (Signed)
Thanks

## 2016-02-14 NOTE — Progress Notes (Signed)
HPI: FU coronary artery disease status post bypass and graft, Parkinson's, diabetes and hypertension as well as hyperlipidemia. Carotid Dopplers in January 2005 showed 0-39% stenosis. Last nuclear study 6/15 showed EF 41, prior MI; no ischemia. Echo 8/15 showed EF 30-35, moderate LAE. TSH 7/15 10.27. Cardiac catheterization September 2015 showed a pulmonary Wedge pressure of 16 and PA pressure 32/20. The LAD was occluded. No obstructive disease in the circumflex. The right coronary had severe diffuse distal disease. Saphenous vein graft to the PDA was patent. Saphenous vein graft to diagonal patent. LIMA to the mid LAD patent. Medical therapy recommended. Had ICD placed November 2015. Since last seen, he has had some dyspnea from bronchitis but denies pedal edema, chest pain, palpitations or syncope.   Current Outpatient Prescriptions  Medication Sig Dispense Refill  . acetaminophen (TYLENOL) 325 MG tablet Take 1-2 tablets (325-650 mg total) by mouth every 4 (four) hours as needed for mild pain.    Marland Kitchen albuterol (PROVENTIL HFA;VENTOLIN HFA) 108 (90 Base) MCG/ACT inhaler Inhale 2 puffs into the lungs every 6 (six) hours as needed for wheezing or shortness of breath. 18 g 5  . aspirin EC 81 MG tablet Take 81 mg by mouth daily.    . benzonatate (TESSALON) 200 MG capsule Take 1 capsule (200 mg total) by mouth 2 (two) times daily as needed for cough. 20 capsule 0  . clonazePAM (KLONOPIN) 0.5 MG tablet TAKE 1 & 1/2 TABLETS BY MOUTH AT BEDTIME AS NEEDED 135 tablet 1  . Fluticasone-Salmeterol (ADVAIR) 250-50 MCG/DOSE AEPB Inhale 1 puff into the lungs 2 (two) times daily. 60 each 5  . furosemide (LASIX) 80 MG tablet TAKE 1.5 TABLETS BY MOUTH EVERY DAY 135 tablet 1  . glimepiride (AMARYL) 4 MG tablet TAKE 1 TABLET BY MOUTH EVERY DAY IN THE MORNING AND 1/2 AT SUPPER 135 tablet 1  . glucose blood (ONETOUCH VERIO) test strip Use as instructed to check blood sugar 2 times a day Dx code E11.49 100 each 5  .  HUMALOG 100 UNIT/ML injection USE MAX 56 UNITS PER DAY   WITH V-GO PUMP 60 mL 1  . ibuprofen (ADVIL,MOTRIN) 200 MG tablet Take 800 mg by mouth every 6 (six) hours as needed (For pain.).     . Insulin Disposable Pump (V-GO 20) KIT USE ONE PER DAY 1 kit 5  . levothyroxine (SYNTHROID, LEVOTHROID) 125 MCG tablet TAKE 1 TABLET EVERY DAY DAILY BEFORE BREAKFAST 90 tablet 1  . metFORMIN (GLUCOPHAGE) 1000 MG tablet TAKE 1 TABLET (1,000 MG TOTAL) BY MOUTH 2 (TWO) TIMES DAILY WITH A MEAL. 180 tablet 3  . metolazone (ZAROXOLYN) 2.5 MG tablet Take 1 tablet on Tuesdays & Fridays ONLY; take 30 minutes before taking Lasix 30 tablet 3  . metoprolol succinate (TOPROL-XL) 50 MG 24 hr tablet TAKE 1 TABLET BY MOUTH DAILY. TAKE WITH OR IMMEDIATELY FOLLOWING A MEAL. 30 tablet 9  . Multiple Vitamin (MULTIVITAMIN WITH MINERALS) TABS tablet Take 1 tablet by mouth daily.     Marland Kitchen NOVOTWIST 32G X 5 MM MISC USE ONE PER DAY TO INJECT VICTOZA 100 each 2  . ONETOUCH DELICA LANCETS 20N MISC Use to check blood sugar 2 times per day dx code E11.49 100 each 5  . potassium chloride (KLOR-CON M10) 10 MEQ tablet Take 4 tablets (40 mEq total) by mouth daily. 360 tablet 0  . Pramipexole Dihydrochloride (MIRAPEX ER) 1.5 MG TB24 Take 1 tablet (1.5 mg total) by mouth daily. 90 tablet 1  .  rosuvastatin (CRESTOR) 20 MG tablet Take 1 tablet (20 mg total) by mouth daily. 90 tablet 3  . traMADol (ULTRAM) 50 MG tablet TAKE 1 TABLET BY MOUTH TWICE A DAY AS NEEDED 60 tablet 3  . travoprost, benzalkonium, (TRAVATAN) 0.004 % ophthalmic solution Place 1 drop into the right eye at bedtime.     . trihexyphenidyl (ARTANE) 2 MG tablet TAKE 1 TABLET TWICE A DAY 180 tablet 1  . VICTOZA 18 MG/3ML SOPN INJECT 1.8MG ONCE DAILY 27 mL 1  . vitamin B-12 (CYANOCOBALAMIN) 1000 MCG tablet Take 1,000 mcg by mouth daily.    . nitroGLYCERIN (NITROSTAT) 0.4 MG SL tablet Place 1 tablet (0.4 mg total) under the tongue every 5 (five) minutes as needed for chest pain. 25 tablet  2   No current facility-administered medications for this visit.      Past Medical History:  Diagnosis Date  . AICD (automatic cardioverter/defibrillator) present    Dr Lovena Le office visit yearly  . Arthritis    cane  . Asthma   . Benign neoplasm of colon   . CAD (coronary artery disease)   . Cardiomyopathy   . Complication of anesthesia    pt states that he got a rash  . Constipation   . 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 GERD (gastroesophageal reflux disease)   . Glaucoma    right eye  . HLD (hyperlipidemia)   . HTN (hypertension)    pt denies 08/19/12  . Hyperplasia, prostate   . Hyperthyroidism    thyroid lobectomy  . MI (myocardial infarction)    Dr Stanford Breed 2000, x3vessels bypass  . Nephrolithiasis   . OSA (obstructive sleep apnea)    AHI-28,on CPAP, noncompliant with CPAP  . Parkinson disease ([place])    1999  . PONV (postoperative nausea and vomiting)   . Restless legs   . Shortness of breath    Hx: of at all times  . Sleep apnea    does not use CPAP  . Sleep apnea, organic   . UTI (lower urinary tract infection) 09/15/12   Klebsiella  . Ventral hernia     Past Surgical History:  Procedure Laterality Date  . 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 CARDIAC CATHETERIZATION    . CATARACT EXTRACTION W/ INTRAOCULAR LENS IMPLANT     Hx: of right eye  . COLONOSCOPY N/A 10/13/2014   Procedure: COLONOSCOPY;  Surgeon: Jerene Bears, MD;  Location: [facility];  Service: Gastroenterology;  Laterality: N/A;  . COLONOSCOPY W/ BIOPSIES AND POLYPECTOMY     Hx: of  . CORONARY ARTERY BYPASS GRAFT  2000   [name], MD  . [place]  . DEEP BRAIN STIMULATOR PLACEMENT  2004   Right and left VIN stimulator placement (parkinsons)  . EYE SURGERY    . FINGER AMPUTATION     left pointer  . IMPLANTABLE CARDIOVERTER DEFIBRILLATOR IMPLANT N/A 11/13/2013   Procedure:  IMPLANTABLE CARDIOVERTER DEFIBRILLATOR IMPLANT;  Surgeon: Evans Lance, MD;  Location: [facility] CATH LAB;  Service: Cardiovascular;  Laterality: N/A;  . INSERT / REPLACE / REMOVE PACEMAKER    . LEFT AND RIGHT HEART CATHETERIZATION WITH CORONARY ANGIOGRAM N/A 09/24/2013   Procedure: LEFT AND RIGHT HEART CATHETERIZATION WITH CORONARY ANGIOGRAM;  Surgeon: Burnell Blanks, MD;  Location: [facility] CATH LAB;  Service: Cardiovascular;  Laterality: N/A;  .  LITHOTRIPSY     3 different times  . MEDIAN STERNOTOMY  2000  . SUBTHALAMIC STIMULATOR BATTERY REPLACEMENT N/A 09/05/2012   Procedure: Deep brain stimulator battery change;  Surgeon: Erline Levine, MD;  Location: [place] NEURO ORS;  Service: Neurosurgery;  Laterality: N/A;  Deep brain stimulator battery change  . SUBTHALAMIC STIMULATOR BATTERY REPLACEMENT N/A 06/10/2015   Procedure: Deep Brain stimulator battery change;  Surgeon: Erline Levine, MD;  Location: [place] NEURO ORS;  Service: Neurosurgery;  Laterality: N/A;  . TONSILLECTOMY      Social History   Social History  . Marital status: Married    Spouse name: [name]  . Number of children: 2  . Years of education: N/A   Occupational History  . 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 Smoking status: Never Smoker  . Smokeless tobacco: Never Used  . Alcohol use Yes     Comment: occasional beer  . Drug use: No  . Sexual activity: Not on file   Other Topics Concern  . Not on file   Social History Narrative   From [facility]   Retired/disability Clinical research associate   Likes to fish.     Married 1972   3 kids   [place] fan    Family History  Problem Relation Age of Onset  . Aneurysm Mother   . Alcoholism Father   . Autoimmune disease Brother 10    AIDS  . Peripheral vascular disease    . Arthritis    . Diabetes Neg Hx   . Heart disease Neg Hx   . Colon cancer Neg Hx   . Prostate cancer Neg Hx     ROS: Recent bronchitis but no fevers or chills,  hemoptysis, dysphasia, odynophagia, melena, hematochezia, dysuria, hematuria, rash, seizure activity, orthopnea, PND, pedal edema, claudication. Remaining systems are negative.  Physical Exam: Well-developed obese in no acute distress.  Skin is warm and dry.  HEENT is normal.  Neck is supple.  Chest is clear to auscultation with normal expansion.  Cardiovascular exam is regular rate and rhythm.  Abdominal exam nontender or distended. No masses palpated. Extremities show no edema. neuro Parkinsons  A/P  1 hypertension-blood pressure controlled. Continue present medications.  2 coronary artery disease-continue aspirin and statin.  3 status post ICD-followed by electrophysiology.  4 ischemic cardiomyopathy-continue beta blocker. Patient is not on an ACE inhibitor or hydralazine/nitrates. He may not be on an ACE inhibitor because of renal insufficiency. I will check his potassium and renal function today and we will begin lisinopril or hydralazine and nitrates based on results.  5 chronic systolic congestive heart failure-continue present dose of diuretics. Check potassium and renal function.  6 hyperlipidemia-continue statin.  Kirk Ruths, MD

## 2016-02-16 ENCOUNTER — Encounter: Payer: Self-pay | Admitting: Family Medicine

## 2016-02-16 ENCOUNTER — Ambulatory Visit (INDEPENDENT_AMBULATORY_CARE_PROVIDER_SITE_OTHER): Payer: Medicare Other | Admitting: Family Medicine

## 2016-02-16 VITALS — BP 126/62 | HR 71 | Temp 97.8°F | Wt 256.8 lb

## 2016-02-16 DIAGNOSIS — I255 Ischemic cardiomyopathy: Secondary | ICD-10-CM

## 2016-02-16 DIAGNOSIS — J069 Acute upper respiratory infection, unspecified: Secondary | ICD-10-CM | POA: Diagnosis not present

## 2016-02-16 MED ORDER — AZITHROMYCIN 250 MG PO TABS: ORAL_TABLET | ORAL | 0 refills | Status: DC

## 2016-02-16 MED ORDER — ALBUTEROL SULFATE HFA 108 (90 BASE) MCG/ACT IN AERS
2.0000 | INHALATION_SPRAY | Freq: Four times a day (QID) | RESPIRATORY_TRACT | 5 refills | Status: DC | PRN
Start: 2016-02-16 — End: 2017-12-28

## 2016-02-16 MED ORDER — BENZONATATE 200 MG PO CAPS: 200.0000 mg | ORAL_CAPSULE | Freq: Two times a day (BID) | ORAL | 0 refills | Status: DC | PRN

## 2016-02-16 NOTE — Progress Notes (Signed)
Pre visit review using our clinic review tool, if applicable. No additional management support is needed unless otherwise documented below in the visit note. 

## 2016-02-16 NOTE — Progress Notes (Signed)
SUBJECTIVE:  Henry [name] is a 66 y.o. male who complains of coryza, congestion and cough described as productive for 5 days. He denies a history of anorexia and chills and has a history of asthma. Patient denies smoke cigarettes. Has just recently restarted advair ( last day or two).   Current Outpatient Prescriptions on File Prior to Visit  Medication Sig Dispense Refill  . acetaminophen (TYLENOL) 325 MG tablet Take 1-2 tablets (325-650 mg total) by mouth every 4 (four) hours as needed for mild pain.    Marland Kitchen albuterol (PROVENTIL HFA;VENTOLIN HFA) 108 (90 Base) MCG/ACT inhaler Inhale 2 puffs into the lungs every 6 (six) hours as needed for wheezing or shortness of breath. 18 g 5  . aspirin EC 81 MG tablet Take 81 mg by mouth daily.    . clonazePAM (KLONOPIN) 0.5 MG tablet TAKE 1 & 1/2 TABLETS BY MOUTH AT BEDTIME AS NEEDED 135 tablet 1  . Fluticasone-Salmeterol (ADVAIR) 250-50 MCG/DOSE AEPB Inhale 1 puff into the lungs 2 (two) times daily. 60 each 5  . furosemide (LASIX) 80 MG tablet TAKE 1.5 TABLETS BY MOUTH EVERY DAY 135 tablet 1  . glimepiride (AMARYL) 4 MG tablet TAKE 1 TABLET BY MOUTH EVERY DAY IN THE MORNING AND 1/2 AT SUPPER 135 tablet 1  . glucose blood (ONETOUCH VERIO) test strip Use as instructed to check blood sugar 2 times a day Dx code E11.49 100 each 5  . HUMALOG 100 UNIT/ML injection USE MAX 56 UNITS PER DAY   WITH V-GO PUMP 60 mL 1  . ibuprofen (ADVIL,MOTRIN) 200 MG tablet Take 800 mg by mouth every 6 (six) hours as needed (For pain.).     . Insulin Disposable Pump (V-GO 20) KIT USE ONE PER DAY 1 kit 5  . levothyroxine (SYNTHROID, LEVOTHROID) 125 MCG tablet TAKE 1 TABLET EVERY DAY DAILY BEFORE BREAKFAST 90 tablet 1  . metFORMIN (GLUCOPHAGE) 1000 MG tablet TAKE 1 TABLET (1,000 MG TOTAL) BY MOUTH 2 (TWO) TIMES DAILY WITH A MEAL. 180 tablet 3  . metolazone (ZAROXOLYN) 2.5 MG tablet Take 1 tablet on Tuesdays & Fridays ONLY; take 30 minutes before taking Lasix 30 tablet 3  .  metoprolol succinate (TOPROL-XL) 50 MG 24 hr tablet TAKE 1 TABLET BY MOUTH DAILY. TAKE WITH OR IMMEDIATELY FOLLOWING A MEAL. 30 tablet 9  . Multiple Vitamin (MULTIVITAMIN WITH MINERALS) TABS tablet Take 1 tablet by mouth daily.     Marland Kitchen NOVOTWIST 32G X 5 MM MISC USE ONE PER DAY TO INJECT VICTOZA 100 each 2  . ONETOUCH DELICA LANCETS 71G MISC Use to check blood sugar 2 times per day dx code E11.49 100 each 5  . potassium chloride (KLOR-CON M10) 10 MEQ tablet Take 4 tablets (40 mEq total) by mouth daily. 360 tablet 0  . Pramipexole Dihydrochloride (MIRAPEX ER) 1.5 MG TB24 Take 1 tablet (1.5 mg total) by mouth daily. 90 tablet 1  . rosuvastatin (CRESTOR) 20 MG tablet Take 1 tablet (20 mg total) by mouth daily. 90 tablet 3  . traMADol (ULTRAM) 50 MG tablet TAKE 1 TABLET BY MOUTH TWICE A DAY AS NEEDED 60 tablet 3  . travoprost, benzalkonium, (TRAVATAN) 0.004 % ophthalmic solution Place 1 drop into the right eye at bedtime.     . trihexyphenidyl (ARTANE) 2 MG tablet TAKE 1 TABLET TWICE A DAY 180 tablet 1  . VICTOZA 18 MG/3ML SOPN INJECT 1.'8MG'$  ONCE DAILY 27 mL 1  . vitamin B-12 (CYANOCOBALAMIN) 1000 MCG tablet Take 1,000 mcg by  mouth daily.    . nitroGLYCERIN (NITROSTAT) 0.4 MG SL tablet Place 1 tablet (0.4 mg total) under the tongue every 5 (five) minutes as needed for chest pain. 25 tablet 2   No current facility-administered medications on file prior to visit.     Allergies  Allergen Reactions  . Penicillins Rash and Other (See Comments)    WEARS ALLERGY BRACELET Because of a history of documented adverse serious drug reaction;Medi Alert bracelet  is recommended Has patient had a PCN reaction causing immediate rash, facial/tongue/throat swelling, SOB or lightheadedness with hypotension: Yes Has patient had a PCN reaction causing severe rash involving mucus membranes or skin necrosis: unknown Has patient had a PCN reaction that required hospitalization NO Has patient had a PCN reaction occurring  within the last 10 years: NO If all of     Past Medical History:  Diagnosis Date  . AICD (automatic cardioverter/defibrillator) present    Dr Lovena Le office visit yearly  . Arthritis    cane  . Asthma   . Benign neoplasm of colon   . CAD (coronary artery disease)   . Cardiomyopathy   . Complication of anesthesia    pt states that he got a rash  . Constipation   . 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 GERD (gastroesophageal reflux disease)   . Glaucoma    right eye  . HLD (hyperlipidemia)   . HTN (hypertension)    pt denies 08/19/12  . Hyperplasia, prostate   . Hyperthyroidism    thyroid lobectomy  . MI (myocardial infarction)    Dr Stanford Breed 2000, x3vessels bypass  . Nephrolithiasis   . OSA (obstructive sleep apnea)    AHI-28,on CPAP, noncompliant with CPAP  . Parkinson disease ([place])    1999  . PONV (postoperative nausea and vomiting)   . Restless legs   . Shortness of breath    Hx: of at all times  . Sleep apnea    does not use CPAP  . Sleep apnea, organic   . UTI (lower urinary tract infection) 09/15/12   Klebsiella  . Ventral hernia     Past Surgical History:  Procedure Laterality Date  . 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 CARDIAC CATHETERIZATION    . CATARACT EXTRACTION W/ INTRAOCULAR LENS IMPLANT     Hx: of right eye  . COLONOSCOPY N/A 10/13/2014   Procedure: COLONOSCOPY;  Surgeon: Jerene Bears, MD;  Location: [facility];  Service: Gastroenterology;  Laterality: N/A;  . COLONOSCOPY W/ BIOPSIES AND POLYPECTOMY     Hx: of  . CORONARY ARTERY BYPASS GRAFT  2000   [name], MD  . [place]  . DEEP BRAIN STIMULATOR PLACEMENT  2004   Right and left VIN stimulator placement (parkinsons)  . EYE SURGERY    . FINGER AMPUTATION     left pointer  . IMPLANTABLE CARDIOVERTER DEFIBRILLATOR IMPLANT N/A 11/13/2013   Procedure: IMPLANTABLE CARDIOVERTER  DEFIBRILLATOR IMPLANT;  Surgeon: Evans Lance, MD;  Location: [facility] CATH LAB;  Service: Cardiovascular;  Laterality: N/A;  . INSERT / REPLACE / REMOVE PACEMAKER    . LEFT AND RIGHT HEART CATHETERIZATION WITH CORONARY ANGIOGRAM N/A 09/24/2013   Procedure: LEFT AND RIGHT HEART CATHETERIZATION WITH CORONARY ANGIOGRAM;  Surgeon: Burnell Blanks, MD;  Location: [facility] CATH LAB;  Service: Cardiovascular;  Laterality: N/A;  .  LITHOTRIPSY     3 different times  . MEDIAN STERNOTOMY  2000  . SUBTHALAMIC STIMULATOR BATTERY REPLACEMENT N/A 09/05/2012   Procedure: Deep brain stimulator battery change;  Surgeon: Erline Levine, MD;  Location: [place] NEURO ORS;  Service: Neurosurgery;  Laterality: N/A;  Deep brain stimulator battery change  . SUBTHALAMIC STIMULATOR BATTERY REPLACEMENT N/A 06/10/2015   Procedure: Deep Brain stimulator battery change;  Surgeon: Erline Levine, MD;  Location: [place] NEURO ORS;  Service: Neurosurgery;  Laterality: N/A;  . TONSILLECTOMY      Family History  Problem Relation Age of Onset  . Aneurysm Mother   . Alcoholism Father   . Autoimmune disease Brother 26    AIDS  . Peripheral vascular disease    . Arthritis    . Diabetes Neg Hx   . Heart disease Neg Hx   . Colon cancer Neg Hx   . Prostate cancer Neg Hx     Social History   Social History  . Marital status: Married    Spouse name: [name]  . Number of children: 2  . Years of education: N/A   Occupational History  . 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 Smoking status: Never Smoker  . Smokeless tobacco: Never Used  . Alcohol use Yes     Comment: occasional beer  . Drug use: No  . Sexual activity: Not on file   Other Topics Concern  . Not on file   Social History Narrative   From [facility]   Retired/disability Clinical research associate   Likes to fish.     Married 1972   3 kids   [place] fan   The PMH, PSH, Social History, Family History, Medications, and allergies have been  reviewed in [facility], and have been updated if relevant.  OBJECTIVE: BP 126/62   Pulse 71   Temp 97.8 F (36.6 C) (Oral)   Wt 256 lb 12 oz (116.5 kg)   SpO2 98%   BMI 37.92 kg/m   He appears well, vital signs are as noted. Ears normal.  Throat and pharynx normal.  Neck supple. No adenopathy in the neck. Nose is congested. Sinuses non tender. Scattered wheezes  ASSESSMENT:  upper respiratory illness and bronchitis  PLAN: [place], discussed continuing using his advair daily with as needed albuterol. Symptomatic therapy suggested: push fluids, rest and return office visit prn if symptoms persist or worsen. Call or return to clinic prn if these symptoms worsen or fail to improve as anticipated.

## 2016-02-21 ENCOUNTER — Ambulatory Visit (INDEPENDENT_AMBULATORY_CARE_PROVIDER_SITE_OTHER): Payer: Medicare Other | Admitting: Cardiology

## 2016-02-21 ENCOUNTER — Encounter: Payer: Self-pay | Admitting: Cardiology

## 2016-02-21 VITALS — BP 110/65 | HR 75 | Ht 70.0 in | Wt 251.0 lb

## 2016-02-21 DIAGNOSIS — I25810 Atherosclerosis of coronary artery bypass graft(s) without angina pectoris: Secondary | ICD-10-CM

## 2016-02-21 DIAGNOSIS — I10 Essential (primary) hypertension: Secondary | ICD-10-CM

## 2016-02-21 DIAGNOSIS — I255 Ischemic cardiomyopathy: Secondary | ICD-10-CM | POA: Diagnosis not present

## 2016-02-21 DIAGNOSIS — E7800 Pure hypercholesterolemia, unspecified: Secondary | ICD-10-CM

## 2016-02-21 LAB — BASIC METABOLIC PANEL
BUN: 34 mg/dL — ABNORMAL HIGH (ref 7–25)
CHLORIDE: 90 mmol/L — AB (ref 98–110)
CO2: 38 mmol/L — AB (ref 20–31)
CREATININE: 1.6 mg/dL — AB (ref 0.70–1.25)
Calcium: 9.6 mg/dL (ref 8.6–10.3)
Glucose, Bld: 185 mg/dL — ABNORMAL HIGH (ref 65–99)
Potassium: 3.2 mmol/L — ABNORMAL LOW (ref 3.5–5.3)
SODIUM: 140 mmol/L (ref 135–146)

## 2016-02-21 NOTE — Patient Instructions (Signed)
Medication Instructions:   NO CHANGE  Labwork:  Your physician recommends that you HAVE LAB WORK TODAY  Follow-Up:  Your physician recommends that you schedule a follow-up appointment in: 3 MONTHS WITH DR CRENSHAW   If you need a refill on your cardiac medications before your next appointment, please call your pharmacy.    

## 2016-02-23 ENCOUNTER — Telehealth: Payer: Self-pay | Admitting: *Deleted

## 2016-02-23 ENCOUNTER — Encounter: Payer: Self-pay | Admitting: Internal Medicine

## 2016-02-23 ENCOUNTER — Ambulatory Visit (INDEPENDENT_AMBULATORY_CARE_PROVIDER_SITE_OTHER): Payer: Medicare Other | Admitting: Internal Medicine

## 2016-02-23 VITALS — BP 118/76 | HR 79 | Temp 98.2°F | Wt 256.0 lb

## 2016-02-23 DIAGNOSIS — I509 Heart failure, unspecified: Secondary | ICD-10-CM

## 2016-02-23 DIAGNOSIS — B9789 Other viral agents as the cause of diseases classified elsewhere: Secondary | ICD-10-CM

## 2016-02-23 DIAGNOSIS — J069 Acute upper respiratory infection, unspecified: Secondary | ICD-10-CM | POA: Diagnosis not present

## 2016-02-23 DIAGNOSIS — I255 Ischemic cardiomyopathy: Secondary | ICD-10-CM

## 2016-02-23 MED ORDER — LISINOPRIL 2.5 MG PO TABS: 2.5000 mg | ORAL_TABLET | Freq: Every day | ORAL | 12 refills | Status: DC

## 2016-02-23 MED ORDER — POTASSIUM CHLORIDE CRYS ER 10 MEQ PO TBCR: 40.0000 meq | EXTENDED_RELEASE_TABLET | Freq: Every day | ORAL | 0 refills | Status: DC

## 2016-02-23 NOTE — Patient Instructions (Signed)
Cough, Adult Introduction A cough helps to clear your throat and lungs. A cough may last only 2-3 weeks (acute), or it may last longer than 8 weeks (chronic). Many different things can cause a cough. A cough may be a sign of an illness or another medical condition. Follow these instructions at home:  Pay attention to any changes in your cough.  Take medicines only as told by your doctor.  If you were prescribed an antibiotic medicine, take it as told by your doctor. Do not stop taking it even if you start to feel better.  Talk with your doctor before you try using a cough medicine.  Drink enough fluid to keep your pee (urine) clear or pale yellow.  If the air is dry, use a cold steam vaporizer or humidifier in your home.  Stay away from things that make you cough at work or at home.  If your cough is worse at night, try using extra pillows to raise your head up higher while you sleep.  Do not smoke, and try not to be around smoke. If you need help quitting, ask your doctor.  Do not have caffeine.  Do not drink alcohol.  Rest as needed. Contact a doctor if:  You have new problems (symptoms).  You cough up yellow fluid (pus).  Your cough does not get better after 2-3 weeks, or your cough gets worse.  Medicine does not help your cough and you are not sleeping well.  You have pain that gets worse or pain that is not helped with medicine.  You have a fever.  You are losing weight and you do not know why.  You have night sweats. Get help right away if:  You cough up blood.  You have trouble breathing.  Your heartbeat is very fast. This information is not intended to replace advice given to you by your health care provider. Make sure you discuss any questions you have with your health care provider. Document Released: 09/08/2010 Document Revised: 06/03/2015 Document Reviewed: 03/04/2014  2017 Elsevier  

## 2016-02-23 NOTE — Telephone Encounter (Signed)
-----   Message from Lelon Perla, MD sent at 02/23/2016 10:34 AM EST ----- Change kcl to 40 meq daily; bmet one week Kirk Ruths  ----- Message ----- From: Earvin Hansen Sent: 02/23/2016  10:16 AM To: Cristopher Estimable, RN, Lelon Perla, MD  Spoke with patient regarding labs and Metolazone change, verbalized understanding. When reviewing K+ dose with patient he is currently taking K+20 meq twice a daily except 40 meq twice a day 2 days a week. Will forward to Dr Stanford Breed for review

## 2016-02-23 NOTE — Telephone Encounter (Signed)
Spoke with pt wife, aware of directions regarding potassium. She will call when refills needed. Also the patient will start lisinopril 2.5 mg once daily. Lab orders mailed to the pt

## 2016-02-23 NOTE — Progress Notes (Signed)
HPI  Pt presents to the clinic today with c/o runny nose, sore throat and cough. This started 1 week ago. He was seen for the same 02/16/16, diagnosed with a URI, and prescribed a Zpack. He reports he took the medication as prescribed, but doesn't feel any better. He is blowing clear mucous out of his nose. He denies difficulty swallowing. The cough is non productive. He has taken Tylenol and Tessalon with some relief. He has a history of asthma, and has been using his inhalers. He has had sick contacts.  Review of Systems        Past Medical History:  Diagnosis Date  . AICD (automatic cardioverter/defibrillator) present    Dr Lovena Le office visit yearly  . Arthritis    cane  . Asthma   . Benign neoplasm of colon   . CAD (coronary artery disease)   . Cardiomyopathy   . Complication of anesthesia    pt states that he got a rash  . Constipation   . 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 DM (diabetes mellitus) ([place])    TYPE 2   . GERD (gastroesophageal reflux disease)   . Glaucoma    right eye  . HLD (hyperlipidemia)   . HTN (hypertension)    pt denies 08/19/12  . Hyperplasia, prostate   . Hyperthyroidism    thyroid lobectomy  . MI (myocardial infarction)    Dr Stanford Breed 2000, x3vessels bypass  . Nephrolithiasis   . OSA (obstructive sleep apnea)    AHI-28,on CPAP, noncompliant with CPAP  . Parkinson disease ([place])    1999  . PONV (postoperative nausea and vomiting)   . Restless legs   . Shortness of breath    Hx: of at all times  . Sleep apnea    does not use CPAP  . Sleep apnea, organic   . UTI (lower urinary tract infection) 09/15/12   Klebsiella  . Ventral hernia     Family History  Problem Relation Age of Onset  . Aneurysm Mother   . Alcoholism Father   . Autoimmune disease Brother 75    AIDS  . Peripheral vascular disease    . Arthritis    . Diabetes Neg Hx   . Heart disease Neg Hx   . Colon cancer Neg Hx   . Prostate cancer Neg Hx      Social History   Social History  . Marital status: Married    Spouse name: [name]  . Number of children: 2  . Years of education: N/A   Occupational History  . 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 Smoking status: Never Smoker  . Smokeless tobacco: Never Used  . Alcohol use Yes     Comment: occasional beer  . Drug use: No  . Sexual activity: Not on file   Other Topics Concern  . Not on file   Social History Narrative   From [facility]   Retired/disability Clinical research associate   Likes to fish.     Married 1972   3 kids   [place] fan    Allergies  Allergen Reactions  . Penicillins Rash and Other (See Comments)    WEARS ALLERGY BRACELET Because of a history of documented adverse serious drug reaction;Medi Alert bracelet  is recommended Has patient had a PCN reaction causing immediate rash, facial/tongue/throat swelling, SOB or lightheadedness with hypotension: Yes Has patient had a PCN  reaction causing severe rash involving mucus membranes or skin necrosis: unknown Has patient had a PCN reaction that required hospitalization NO Has patient had a PCN reaction occurring within the last 10 years: NO If all of      Constitutional: Denies headache, fatigue, fever or abrupt weight changes.  HEENT:  Positive runny nose, sore throat. Denies eye redness, eye pain, pressure behind the eyes, facial pain, nasal congestion, ear pain, ringing in the ears, wax buildup, or bloody nose. Respiratory: Positive cough. Denies difficulty breathing or shortness of breath.  Cardiovascular: Denies chest pain, chest tightness, palpitations or swelling in the hands or feet.   No other specific complaints in a complete review of systems (except as listed in HPI above).  Objective:   BP 118/76   Pulse 79   Temp 98.2 F (36.8 C) (Oral)   Wt 256 lb (116.1 kg)   SpO2 97%   BMI 36.73 kg/m  Wt Readings from Last 3 Encounters:  02/23/16 256 lb (116.1  kg)  02/21/16 251 lb (113.9 kg)  02/16/16 256 lb 12 oz (116.5 kg)     General: Appears his stated age, obese, chronically ill appearing, in NAD. HEENT: Head: normal shape and size, no sinus tenderness noted; Nose: mucosa pink and moist, septum midline; Throat/Mouth: + PND. Teeth present, mucosa pink and moist, no exudate noted, no lesions or ulcerations noted.  Neck: No cervical lymphadenopathy.  Cardiovascular: Normal rate and rhythm. S1,S2 noted.  No murmur, rubs or gallops noted.  Pulmonary/Chest: Normal effort and positive vesicular breath sounds. No respiratory distress. No wheezes, rales or ronchi noted.       Assessment & Plan:   Viral Upper Respiratory Infection with Cough:  No indication for repeat antibiotics Get some rest and drink plenty of water Continue Tessalon Offered Flonase but he declines  RTC as needed or if symptoms persist.   Webb Silversmith, NP

## 2016-02-24 ENCOUNTER — Ambulatory Visit: Payer: Medicare Other | Admitting: Family Medicine

## 2016-03-02 DIAGNOSIS — I509 Heart failure, unspecified: Secondary | ICD-10-CM | POA: Diagnosis not present

## 2016-03-03 LAB — BASIC METABOLIC PANEL
BUN: 19 mg/dL (ref 7–25)
CHLORIDE: 99 mmol/L (ref 98–110)
CO2: 31 mmol/L (ref 20–31)
CREATININE: 1.26 mg/dL — AB (ref 0.70–1.25)
Calcium: 8.7 mg/dL (ref 8.6–10.3)
Glucose, Bld: 254 mg/dL — ABNORMAL HIGH (ref 65–99)
Potassium: 4.1 mmol/L (ref 3.5–5.3)
Sodium: 141 mmol/L (ref 135–146)

## 2016-03-06 DIAGNOSIS — Z961 Presence of intraocular lens: Secondary | ICD-10-CM | POA: Diagnosis not present

## 2016-03-06 DIAGNOSIS — H2512 Age-related nuclear cataract, left eye: Secondary | ICD-10-CM | POA: Diagnosis not present

## 2016-03-06 DIAGNOSIS — H4030X1 Glaucoma secondary to eye trauma, unspecified eye, mild stage: Secondary | ICD-10-CM | POA: Diagnosis not present

## 2016-03-06 DIAGNOSIS — H40032 Anatomical narrow angle, left eye: Secondary | ICD-10-CM | POA: Diagnosis not present

## 2016-03-06 DIAGNOSIS — H25012 Cortical age-related cataract, left eye: Secondary | ICD-10-CM | POA: Diagnosis not present

## 2016-03-06 LAB — HM DIABETES EYE EXAM

## 2016-03-07 DIAGNOSIS — M545 Low back pain: Secondary | ICD-10-CM | POA: Diagnosis not present

## 2016-03-07 DIAGNOSIS — R2689 Other abnormalities of gait and mobility: Secondary | ICD-10-CM | POA: Diagnosis not present

## 2016-03-07 DIAGNOSIS — R296 Repeated falls: Secondary | ICD-10-CM | POA: Diagnosis not present

## 2016-03-07 DIAGNOSIS — M6281 Muscle weakness (generalized): Secondary | ICD-10-CM | POA: Diagnosis not present

## 2016-03-09 DIAGNOSIS — M6281 Muscle weakness (generalized): Secondary | ICD-10-CM | POA: Diagnosis not present

## 2016-03-09 DIAGNOSIS — R296 Repeated falls: Secondary | ICD-10-CM | POA: Diagnosis not present

## 2016-03-09 DIAGNOSIS — R2689 Other abnormalities of gait and mobility: Secondary | ICD-10-CM | POA: Diagnosis not present

## 2016-03-09 DIAGNOSIS — M545 Low back pain: Secondary | ICD-10-CM | POA: Diagnosis not present

## 2016-03-14 ENCOUNTER — Encounter: Payer: Self-pay | Admitting: Family Medicine

## 2016-03-14 DIAGNOSIS — R2689 Other abnormalities of gait and mobility: Secondary | ICD-10-CM | POA: Diagnosis not present

## 2016-03-14 DIAGNOSIS — M545 Low back pain: Secondary | ICD-10-CM | POA: Diagnosis not present

## 2016-03-14 DIAGNOSIS — M6281 Muscle weakness (generalized): Secondary | ICD-10-CM | POA: Diagnosis not present

## 2016-03-14 DIAGNOSIS — R296 Repeated falls: Secondary | ICD-10-CM | POA: Diagnosis not present

## 2016-03-21 DIAGNOSIS — R2689 Other abnormalities of gait and mobility: Secondary | ICD-10-CM | POA: Diagnosis not present

## 2016-03-21 DIAGNOSIS — M2681 Anterior soft tissue impingement: Secondary | ICD-10-CM | POA: Diagnosis not present

## 2016-03-21 DIAGNOSIS — M545 Low back pain: Secondary | ICD-10-CM | POA: Diagnosis not present

## 2016-03-21 DIAGNOSIS — M6281 Muscle weakness (generalized): Secondary | ICD-10-CM | POA: Diagnosis not present

## 2016-03-21 DIAGNOSIS — R296 Repeated falls: Secondary | ICD-10-CM | POA: Diagnosis not present

## 2016-03-28 DIAGNOSIS — H25812 Combined forms of age-related cataract, left eye: Secondary | ICD-10-CM | POA: Diagnosis not present

## 2016-03-28 DIAGNOSIS — H2512 Age-related nuclear cataract, left eye: Secondary | ICD-10-CM | POA: Diagnosis not present

## 2016-03-29 ENCOUNTER — Other Ambulatory Visit (INDEPENDENT_AMBULATORY_CARE_PROVIDER_SITE_OTHER): Payer: Medicare Other

## 2016-03-29 DIAGNOSIS — Z794 Long term (current) use of insulin: Secondary | ICD-10-CM

## 2016-03-29 DIAGNOSIS — E1165 Type 2 diabetes mellitus with hyperglycemia: Secondary | ICD-10-CM

## 2016-03-29 LAB — COMPREHENSIVE METABOLIC PANEL
ALK PHOS: 59 U/L (ref 39–117)
ALT: 14 U/L (ref 0–53)
AST: 10 U/L (ref 0–37)
Albumin: 3.9 g/dL (ref 3.5–5.2)
BILIRUBIN TOTAL: 0.3 mg/dL (ref 0.2–1.2)
BUN: 23 mg/dL (ref 6–23)
CALCIUM: 9.2 mg/dL (ref 8.4–10.5)
CO2: 34 mEq/L — ABNORMAL HIGH (ref 19–32)
Chloride: 101 mEq/L (ref 96–112)
Creatinine, Ser: 1.25 mg/dL (ref 0.40–1.50)
GFR: 61.68 mL/min (ref >60.00)
Glucose, Bld: 175 mg/dL — ABNORMAL HIGH (ref 70–99)
Potassium: 4.1 mEq/L (ref 3.5–5.1)
Sodium: 141 mEq/L (ref 135–145)
TOTAL PROTEIN: 6.3 g/dL (ref 6.0–8.3)

## 2016-03-29 LAB — TSH: TSH: 2.35 u[IU]/mL (ref 0.35–4.50)

## 2016-03-29 LAB — HEMOGLOBIN A1C: HEMOGLOBIN A1C: 6.9 % — AB (ref 4.6–6.5)

## 2016-04-03 ENCOUNTER — Ambulatory Visit (INDEPENDENT_AMBULATORY_CARE_PROVIDER_SITE_OTHER): Payer: Medicare Other | Admitting: Endocrinology

## 2016-04-03 ENCOUNTER — Encounter: Payer: Self-pay | Admitting: Endocrinology

## 2016-04-03 VITALS — BP 100/64 | HR 78 | Ht 70.0 in | Wt 262.0 lb

## 2016-04-03 DIAGNOSIS — E1165 Type 2 diabetes mellitus with hyperglycemia: Secondary | ICD-10-CM | POA: Diagnosis not present

## 2016-04-03 DIAGNOSIS — Z794 Long term (current) use of insulin: Secondary | ICD-10-CM

## 2016-04-03 DIAGNOSIS — I255 Ischemic cardiomyopathy: Secondary | ICD-10-CM

## 2016-04-03 NOTE — Patient Instructions (Addendum)
Check blood sugars on waking up  3x per week  Also check blood sugars about 2 hours after a meal and do this after different meals by rotation  Recommended blood sugar levels on waking up is 90-130 and about 2 hours after meal is 130-160  Please bring your blood sugar monitor to each visit, thank you  Stop Glimeperide in ams

## 2016-04-03 NOTE — Progress Notes (Signed)
Patient ID: Henry [name], male   DOB: 1950-01-18, 66 y.o.   MRN: [id number]           Reason for Appointment: Follow-up for Type 2 Diabetes   History of Present Illness:          Diagnosis: Type 2 diabetes mellitus, date of diagnosis: 2000      Past history:   Patient thinks he has been taking Amaryl for several years and probably metformin since onset also At some point he was changed from metformin to [facility] and was taking this since at least 2012 A1c had been higher in 2015 Since his A1c had been progressively higher with his regimen of Janumet and Amaryl he was started on Victoza in 01/2014 He was started on Lantus insulin in 5/16 because of persistent hyperglycemia especially fasting; was having readings as high as 293 Because of tendency to high postprandial readings and for control with Lantus he was switched to the V-go pump in  09/2014  Recent history:   INSULIN regimen: V-go-20 pump and boluses 4-8 units; 6-8 at dinner  Non-insulin hypoglycemic drugs the patient is taking are: Metformin 1 g twice a day, Amaryl 4 mg a.m., 2 mg p.m., Victoza 1.8 mg daily    However A1c is now 6.9 previously 8%  Current management, blood sugar patterns and problems identified:  He is now trying to change his V-go pump in the evening daily around 7 PM, previously not changing it until the insulin ran out and his sugars were overall higher  Although he is trying to take his insulin with his meals to cover most of his intake he is probably getting too many snacks especially at night making his overnight blood sugars quite variable  He says he does not like to watch his diet and portions and also likes fried food more  Checking blood sugars mostly in the mornings, also sometimes early morning before getting out of bed  Has only a couple of readings after supper or lunch  Not able to do much walking because of various issues including balance  He has seen the dietitian in 3/16 Not able  to exercise        Side effects from medications have been: None Compliance with the medical regimen: Fair  Hypoglycemia:   none  Glucose monitoring:  done 1-2 times  a day         Glucometer: One Touch ultra 2 .      Blood Glucose readings by   Mean values apply above for all meters except median for One Touch  PRE-MEAL Fasting Lunch Dinner Bedtime Overall  Glucose range: 86-223  110-170  142-215    Mean/median: 140    151    Self-care: The diet that the patient has been following [id number], some fried food and cookies, mostly sugar-free         Meals: 9 am , 2-3 pm and 7 pm     Exercise: unable to do any         Dietician visit, most recent: 03/2014                Weight history:   Wt Readings from Last 3 Encounters:  04/03/16 262 lb (118.8 kg)  02/23/16 256 lb (116.1 kg)  02/21/16 251 lb (113.9 kg)    Glycemic control:   Lab Results  Component Value Date   HGBA1C 6.9 (H) 03/29/2016   HGBA1C 8.0 (H) 12/27/2015   HGBA1C 6.9 (H)  09/16/2015   Lab Results  Component Value Date   MICROALBUR 1.1 12/27/2015   LDLCALC 118 (H) 09/16/2015   CREATININE 1.25 03/29/2016   Other active problems: See review of systems  Lab on 03/29/2016  Component Date Value Ref Range Status  . Hgb A1c MFr Bld 03/29/2016 6.9* 4.6 - 6.5 % Final  . Sodium 03/29/2016 141  135 - 145 mEq/L Final  . Potassium 03/29/2016 4.1  3.5 - 5.1 mEq/L Final  . Chloride 03/29/2016 101  96 - 112 mEq/L Final  . CO2 03/29/2016 34* 19 - 32 mEq/L Final  . Glucose, Bld 03/29/2016 175* 70 - 99 mg/dL Final  . BUN 03/29/2016 23  6 - 23 mg/dL Final  . Creatinine, Ser 03/29/2016 1.25  0.40 - 1.50 mg/dL Final  . Total Bilirubin 03/29/2016 0.3  0.2 - 1.2 mg/dL Final  . Alkaline Phosphatase 03/29/2016 59  39 - 117 U/L Final  . AST 03/29/2016 10  0 - 37 U/L Final  . ALT 03/29/2016 14  0 - 53 U/L Final  . Total Protein 03/29/2016 6.3  6.0 - 8.3 g/dL Final  . Albumin 03/29/2016 3.9  3.5 - 5.2 g/dL Final  . Calcium  03/29/2016 9.2  8.4 - 10.5 mg/dL Final  . GFR 03/29/2016 61.68  >60.00 mL/min Final  . TSH 03/29/2016 2.35  0.35 - 4.50 uIU/mL Final      Allergies as of 04/03/2016      Reactions   Penicillins Rash, Other (See Comments)   WEARS ALLERGY BRACELET Because of a history of documented adverse serious drug reaction;Medi Alert bracelet  is recommended Has patient had a PCN reaction causing immediate rash, facial/tongue/throat swelling, SOB or lightheadedness with hypotension: Yes Has patient had a PCN reaction causing severe rash involving mucus membranes or skin necrosis: unknown Has patient had a PCN reaction that required hospitalization NO Has patient had a PCN reaction occurring within the last 10 years: NO If all of       Medication List       Accurate as of 04/03/16  1:56 PM. Always use your most recent med list.          acetaminophen 325 MG tablet Commonly known as:  TYLENOL Take 1-2 tablets (325-650 mg total) by mouth every 4 (four) hours as needed for mild pain.   albuterol 108 (90 Base) MCG/ACT inhaler Commonly known as:  PROVENTIL HFA;VENTOLIN HFA Inhale 2 puffs into the lungs every 6 (six) hours as needed for wheezing or shortness of breath.   aspirin EC 81 MG tablet Take 81 mg by mouth daily.   benzonatate 200 MG capsule Commonly known as:  TESSALON Take 1 capsule (200 mg total) by mouth 2 (two) times daily as needed for cough.   clonazePAM 0.5 MG tablet Commonly known as:  KLONOPIN TAKE 1 & 1/2 TABLETS BY MOUTH AT BEDTIME AS NEEDED   Fluticasone-Salmeterol 250-50 MCG/DOSE Aepb Commonly known as:  ADVAIR Inhale 1 puff into the lungs 2 (two) times daily.   furosemide 80 MG tablet Commonly known as:  LASIX TAKE 1.5 TABLETS BY MOUTH EVERY DAY   glimepiride 4 MG tablet Commonly known as:  AMARYL TAKE 1 TABLET BY MOUTH EVERY DAY IN THE MORNING AND 1/2 AT SUPPER   glucose blood test strip Commonly known as:  ONETOUCH VERIO Use as instructed to check blood  sugar 2 times a day Dx code E11.49   HUMALOG 100 UNIT/ML injection Generic drug:  insulin lispro USE MAX 56  UNITS PER DAY   WITH V-GO PUMP   ibuprofen 200 MG tablet Commonly known as:  ADVIL,MOTRIN Take 800 mg by mouth every 6 (six) hours as needed (For pain.).   levothyroxine 125 MCG tablet Commonly known as:  SYNTHROID, LEVOTHROID TAKE 1 TABLET EVERY DAY DAILY BEFORE BREAKFAST   lisinopril 2.5 MG tablet Commonly known as:  PRINIVIL,ZESTRIL Take 1 tablet (2.5 mg total) by mouth daily.   metFORMIN 1000 MG tablet Commonly known as:  GLUCOPHAGE TAKE 1 TABLET (1,000 MG TOTAL) BY MOUTH 2 (TWO) TIMES DAILY WITH A MEAL.   metolazone 2.5 MG tablet Commonly known as:  ZAROXOLYN Take 1 tablet on Tuesdays & Fridays ONLY; take 30 minutes before taking Lasix   metoprolol succinate 50 MG 24 hr tablet Commonly known as:  TOPROL-XL TAKE 1 TABLET BY MOUTH DAILY. TAKE WITH OR IMMEDIATELY FOLLOWING A MEAL.   multivitamin with minerals Tabs tablet Take 1 tablet by mouth daily.   nitroGLYCERIN 0.4 MG SL tablet Commonly known as:  NITROSTAT Place 1 tablet (0.4 mg total) under the tongue every 5 (five) minutes as needed for chest pain.   NOVOTWIST 32G X 5 MM Misc Generic drug:  Insulin Pen Needle USE ONE PER DAY TO INJECT VICTOZA   ONETOUCH DELICA LANCETS 47Q Misc Use to check blood sugar 2 times per day dx code E11.49   potassium chloride 10 MEQ tablet Commonly known as:  KLOR-CON M10 Take 4 tablets (40 mEq total) by mouth daily.   Pramipexole Dihydrochloride 1.5 MG Tb24 Commonly known as:  MIRAPEX ER Take 1 tablet (1.5 mg total) by mouth daily.   rosuvastatin 20 MG tablet Commonly known as:  CRESTOR Take 1 tablet (20 mg total) by mouth daily.   traMADol 50 MG tablet Commonly known as:  ULTRAM TAKE 1 TABLET BY MOUTH TWICE A DAY AS NEEDED   travoprost (benzalkonium) 0.004 % ophthalmic solution Commonly known as:  TRAVATAN Place 1 drop into the right eye at bedtime.     trihexyphenidyl 2 MG tablet Commonly known as:  ARTANE TAKE 1 TABLET TWICE A DAY   V-GO 20 Kit USE ONE PER DAY   VICTOZA 18 MG/3ML Sopn Generic drug:  liraglutide INJECT 1.'8MG'$  ONCE DAILY   vitamin B-12 1000 MCG tablet Commonly known as:  CYANOCOBALAMIN Take 1,000 mcg by mouth daily.       Allergies:  Allergies  Allergen Reactions  . Penicillins Rash and Other (See Comments)    WEARS ALLERGY BRACELET Because of a history of documented adverse serious drug reaction;Medi Alert bracelet  is recommended Has patient had a PCN reaction causing immediate rash, facial/tongue/throat swelling, SOB or lightheadedness with hypotension: Yes Has patient had a PCN reaction causing severe rash involving mucus membranes or skin necrosis: unknown Has patient had a PCN reaction that required hospitalization NO Has patient had a PCN reaction occurring within the last 10 years: NO If all of     Past Medical History:  Diagnosis Date  . AICD (automatic cardioverter/defibrillator) present    Dr Lovena Le office visit yearly  . Arthritis    cane  . Asthma   . Benign neoplasm of colon   . CAD (coronary artery disease)   . Cardiomyopathy   . Complication of anesthesia    pt states that he got a rash  . Constipation   . 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 DM (diabetes mellitus) ([place])    TYPE 2   . GERD (  gastroesophageal reflux disease)   . Glaucoma    right eye  . HLD (hyperlipidemia)   . HTN (hypertension)    pt denies 08/19/12  . Hyperplasia, prostate   . Hyperthyroidism    thyroid lobectomy  . MI (myocardial infarction)    Dr Stanford Breed 2000, x3vessels bypass  . Nephrolithiasis   . OSA (obstructive sleep apnea)    AHI-28,on CPAP, noncompliant with CPAP  . Parkinson disease ([place])    1999  . PONV (postoperative nausea and vomiting)   . Restless legs   . Shortness of breath    Hx: of at all times  . Sleep apnea    does not use CPAP  . Sleep apnea, organic    . UTI (lower urinary tract infection) 09/15/12   Klebsiella  . Ventral hernia     Past Surgical History:  Procedure Laterality Date  . 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 CARDIAC CATHETERIZATION    . CATARACT EXTRACTION W/ INTRAOCULAR LENS IMPLANT     Hx: of right eye  . COLONOSCOPY N/A 10/13/2014   Procedure: COLONOSCOPY;  Surgeon: Jerene Bears, MD;  Location: [facility]OSCOPY;  Service: Gastroenterology;  Laterality: N/A;  . COLONOSCOPY W/ BIOPSIES AND POLYPECTOMY     Hx: of  . CORONARY ARTERY BYPASS GRAFT  2000   [name], MD  . [place]  . DEEP BRAIN STIMULATOR PLACEMENT  2004   Right and left VIN stimulator placement (parkinsons)  . EYE SURGERY    . FINGER AMPUTATION     left pointer  . IMPLANTABLE CARDIOVERTER DEFIBRILLATOR IMPLANT N/A 11/13/2013   Procedure: IMPLANTABLE CARDIOVERTER DEFIBRILLATOR IMPLANT;  Surgeon: Evans Lance, MD;  Location: [facility] CATH LAB;  Service: Cardiovascular;  Laterality: N/A;  . INSERT / REPLACE / REMOVE PACEMAKER    . LEFT AND RIGHT HEART CATHETERIZATION WITH CORONARY ANGIOGRAM N/A 09/24/2013   Procedure: LEFT AND RIGHT HEART CATHETERIZATION WITH CORONARY ANGIOGRAM;  Surgeon: Burnell Blanks, MD;  Location: [facility] CATH LAB;  Service: Cardiovascular;  Laterality: N/A;  . LITHOTRIPSY     3 different times  . MEDIAN STERNOTOMY  2000  . SUBTHALAMIC STIMULATOR BATTERY REPLACEMENT N/A 09/05/2012   Procedure: Deep brain stimulator battery change;  Surgeon: Erline Levine, MD;  Location: [facility];  Service: Neurosurgery;  Laterality: N/A;  Deep brain stimulator battery change  . SUBTHALAMIC STIMULATOR BATTERY REPLACEMENT N/A 06/10/2015   Procedure: Deep Brain stimulator battery change;  Surgeon: Erline Levine, MD;  Location: [place] [facility]UR[facility] OR[facility];  Service: Neurosurgery;  Laterality: N/A;  . TONSILLECTOMY      Family History  Problem Relation Age of Onset  . Aneurysm Mother   . Alcoholism Father   . Autoimmune disease  Brother 19    AIDS  . Peripheral vascular disease    . Arthritis    . Diabetes Neg Hx   . Heart disease Neg Hx   . Colon cancer Neg Hx   . Prostate cancer Neg Hx     Social History:  reports that he has never smoked. He has never used smokeless tobacco. He reports that he drinks alcohol. He reports that he does not use drugs.    Review of Systems     Most recent eye exam was done in 2/18    HYPERLIPIDEMIA: He was switched from Lipitor to Crestor because of muscle aches with Lipitor and LDL is below 70 now      Lab Results  Component Value Date   CHOL 132 12/06/2015   CHOL 182 09/16/2015   CHOL 147 05/19/2014   Lab Results  Component Value Date   HDL 30.20 (L) 12/06/2015   HDL 31.30 (L) 09/16/2015   HDL 35.50 (L) 05/19/2014   Lab Results  Component Value Date   LDLCALC 118 (H) 09/16/2015   LDLCALC 126 (H) 04/08/2013   LDLCALC 51 02/28/2012   Lab Results  Component Value Date   TRIG 217.0 (H) 12/06/2015   TRIG 165.0 (H) 09/16/2015   TRIG 213.0 (H) 05/19/2014   Lab Results  Component Value Date   CHOLHDL 4 12/06/2015   CHOLHDL 6 09/16/2015   CHOLHDL 4 05/19/2014   Lab Results  Component Value Date   LDLDIRECT 69.0 12/06/2015   LDLDIRECT 86.0 05/19/2014   LDLDIRECT 88.7 08/06/2013                  Thyroid:   He has had hypothyroidism following radioactive iodine treatment for hyperthyroidism in 2013  TSH is usually normal with  dosage of 125 g   Lab Results  Component Value Date   TSH 2.35 03/29/2016   TSH 3.58 09/16/2015   TSH 2.06 12/15/2014   FREET4 0.82 08/20/2014   FREET4 0.98 11/20/2011   FREET4 0.91 10/16/2011       The blood pressure has been followed by  other physicians, currently taking  50 mg metoprolol but only 2.5 lisinopril Also on multiple diuretics from cardiologist  Lab Results  Component Value Date   CREATININE 1.25 03/29/2016   BUN 23 03/29/2016   NA 141 03/29/2016   K 4.1 03/29/2016   CL 101 03/29/2016   CO2 34 (H)  03/29/2016     Diabetic foot exam in 03/2016 showed  normal monofilament sensation in the toes except decreased on the right great toe and plantar surfaces, no skin lesions or ulcers on the feet and normal pedal pulses    Physical Examination:  BP 100/64   Pulse 78   Ht '5\' 10"'$  (1.778 m)   Wt 262 lb (118.8 kg)   SpO2 97%   BMI 37.59 kg/m      ASSESSMENT/PLAN  Diabetes type 2, uncontrolled with obesity    See history of present illness for detailed discussion of his current management, blood sugar patterns and problems identified  His blood sugars are overall better with A1c below 7 again However he has readings as high as 223 and blood sugars tend to be higher later in the evening or overnight based on his compliance with watching his portions, types of foods and ability to bolus for snacks Lowest blood sugar 86 early morning  Recommendations: Try to be more consistent with diet with cutting back on portions, high-fat foods and snacks and sweets He will stay with the same basal rate for now He can stop Amaryl in the morning as he is likely to be benefiting from this  Hypothyroidism:  TSH normal   Patient Instructions  Check blood sugars on waking up  3x per week  Also check blood sugars about 2 hours after a meal and do this after different meals by rotation  Recommended blood sugar levels on waking up is 90-130 and about 2 hours after meal is 130-160  Please bring your blood sugar monitor to each visit, thank you  Stop Glimeperide in ams     [facility] 04/03/2016, 1:56 PM   Note: This office note was prepared with Estate agent. Any  transcriptional errors that result from this process are unintentional.

## 2016-04-06 ENCOUNTER — Other Ambulatory Visit: Payer: Self-pay | Admitting: Endocrinology

## 2016-04-07 ENCOUNTER — Encounter: Payer: Self-pay | Admitting: Family Medicine

## 2016-04-07 ENCOUNTER — Other Ambulatory Visit: Payer: Self-pay | Admitting: Endocrinology

## 2016-04-10 ENCOUNTER — Other Ambulatory Visit: Payer: Self-pay | Admitting: Family Medicine

## 2016-04-10 DIAGNOSIS — I509 Heart failure, unspecified: Secondary | ICD-10-CM

## 2016-04-10 MED ORDER — POTASSIUM CHLORIDE CRYS ER 20 MEQ PO TBCR: 40.0000 meq | EXTENDED_RELEASE_TABLET | Freq: Every day | ORAL | 3 refills | Status: DC

## 2016-04-11 ENCOUNTER — Encounter: Payer: Self-pay | Admitting: Internal Medicine

## 2016-04-26 ENCOUNTER — Ambulatory Visit (INDEPENDENT_AMBULATORY_CARE_PROVIDER_SITE_OTHER): Payer: Medicare Other | Admitting: Internal Medicine

## 2016-04-26 ENCOUNTER — Encounter: Payer: Self-pay | Admitting: Internal Medicine

## 2016-04-26 VITALS — BP 106/66 | HR 79 | Ht 69.0 in | Wt 259.0 lb

## 2016-04-26 DIAGNOSIS — I255 Ischemic cardiomyopathy: Secondary | ICD-10-CM | POA: Diagnosis not present

## 2016-04-26 DIAGNOSIS — I5022 Chronic systolic (congestive) heart failure: Secondary | ICD-10-CM | POA: Diagnosis not present

## 2016-04-26 DIAGNOSIS — I2589 Other forms of chronic ischemic heart disease: Secondary | ICD-10-CM

## 2016-04-26 LAB — CUP PACEART INCLINIC DEVICE CHECK
Battery Remaining Longevity: 112 mo
Battery Voltage: 3 V
Brady Statistic AS VS Percent: 97.94 %
Brady Statistic RA Percent Paced: 2 %
Date Time Interrogation Session: 20180418135131
HIGH POWER IMPEDANCE MEASURED VALUE: 66 Ohm
Implantable Lead Implant Date: 20151105
Implantable Lead Location: 753859
Implantable Lead Location: 753860
Implantable Lead Model: 5076
Lead Channel Impedance Value: 494 Ohm
Lead Channel Pacing Threshold Amplitude: 1 V
Lead Channel Pacing Threshold Pulse Width: 0.4 ms
Lead Channel Sensing Intrinsic Amplitude: 2.25 mV
Lead Channel Setting Pacing Amplitude: 2.5 V
Lead Channel Setting Pacing Pulse Width: 0.4 ms
Lead Channel Setting Sensing Sensitivity: 0.3 mV
MDC IDC LEAD IMPLANT DT: 20151105
MDC IDC MSMT LEADCHNL RA IMPEDANCE VALUE: 456 Ohm
MDC IDC MSMT LEADCHNL RA SENSING INTR AMPL: 2.625 mV
MDC IDC MSMT LEADCHNL RV IMPEDANCE VALUE: 437 Ohm
MDC IDC MSMT LEADCHNL RV PACING THRESHOLD AMPLITUDE: 0.5 V
MDC IDC MSMT LEADCHNL RV PACING THRESHOLD PULSEWIDTH: 0.4 ms
MDC IDC MSMT LEADCHNL RV SENSING INTR AMPL: 17.125 mV
MDC IDC MSMT LEADCHNL RV SENSING INTR AMPL: 17.625 mV
MDC IDC PG IMPLANT DT: 20151105
MDC IDC SET LEADCHNL RA PACING AMPLITUDE: 2 V
MDC IDC STAT BRADY AP VP PERCENT: 0.01 %
MDC IDC STAT BRADY AP VS PERCENT: 2.01 %
MDC IDC STAT BRADY AS VP PERCENT: 0.04 %
MDC IDC STAT BRADY RV PERCENT PACED: 0.05 %

## 2016-04-26 MED ORDER — METOLAZONE 2.5 MG PO TABS: ORAL_TABLET | ORAL | 3 refills | Status: DC

## 2016-04-26 NOTE — Patient Instructions (Addendum)
Medication Instructions:  TAKE metolazone 2.5 mg ONCE A WEEK, 30 minutes before taking lasix. TAKE an extra 27meq of Potassium when you take metolazone.  Labwork: None ordered  Testing/Procedures: None ordered  Follow-Up: Remote monitoring is used to monitor your ICD from home. This monitoring reduces the number of office visits required to check your device to one time per year. It allows [place] to keep an eye on the functioning of your device to ensure it is working properly. You are scheduled for a device check from home on 07/26/16. You may send your transmission at any time that day. If you have a wireless device, the transmission will be sent automatically. After your physician reviews your transmission, you will receive a postcard with your next transmission date.    Your physician wants you to follow-up in: 1 year with Dr. Lovena Le. You will receive a reminder letter in the mail two months in advance. If you don't receive a letter, please call our office to schedule the follow-up appointment.   Any Other Special Instructions Will Be Listed Below (If Applicable).     If you need a refill on your cardiac medications before your next appointment, please call your pharmacy.

## 2016-04-26 NOTE — Progress Notes (Signed)
HPI [name] returns today for followup. He is a pleasant 65 yo man with a h/o chronic systolic heart failure, ICM, s/p ICD implantation. In the interim, he has been stable and he denies any ICD therapy.  No chest pain. He has class 2 sob. No worsening edema. He stopped taking metolazone back in February and his fluid index has been up. He has minimal increase in dyspnea and peripheral edema. Allergies  Allergen Reactions  . Penicillins Rash and Other (See Comments)    WEARS ALLERGY BRACELET Because of a history of documented adverse serious drug reaction;Medi Alert bracelet  is recommended Has patient had a PCN reaction causing immediate rash, facial/tongue/throat swelling, SOB or lightheadedness with hypotension: Yes Has patient had a PCN reaction causing severe rash involving mucus membranes or skin necrosis: unknown Has patient had a PCN reaction that required hospitalization NO Has patient had a PCN reaction occurring within the last 10 years: NO If all of      Current Outpatient Prescriptions  Medication Sig Dispense Refill  . acetaminophen (TYLENOL) 325 MG tablet Take 1-2 tablets (325-650 mg total) by mouth every 4 (four) hours as needed for mild pain.    Marland Kitchen albuterol (PROVENTIL HFA;VENTOLIN HFA) 108 (90 Base) MCG/ACT inhaler Inhale 2 puffs into the lungs every 6 (six) hours as needed for wheezing or shortness of breath. 18 g 5  . aspirin EC 81 MG tablet Take 81 mg by mouth daily.    . benzonatate (TESSALON) 200 MG capsule Take 1 capsule (200 mg total) by mouth 2 (two) times daily as needed for cough. 20 capsule 0  . Fluticasone-Salmeterol (ADVAIR) 250-50 MCG/DOSE AEPB Inhale 1 puff into the lungs 2 (two) times daily. 60 each 5  . furosemide (LASIX) 80 MG tablet TAKE 1.5 TABLETS BY MOUTH EVERY DAY 135 tablet 1  . glimepiride (AMARYL) 4 MG tablet TAKE 1 TABLET BY MOUTH EVERY DAY IN THE MORNING AND 1/2 AT SUPPER 135 tablet 1  . glucose blood (ONETOUCH VERIO) test strip Use as  instructed to check blood sugar 2 times a day Dx code E11.49 100 each 5  . HUMALOG 100 UNIT/ML injection USE MAX 56 UNITS PER DAY   WITH V-GO PUMP 60 mL 1  . ibuprofen (ADVIL,MOTRIN) 200 MG tablet Take 800 mg by mouth every 6 (six) hours as needed (For pain.).     . Insulin Disposable Pump (V-GO 20) KIT USE ONE PER DAY 1 kit 5  . levothyroxine (SYNTHROID, LEVOTHROID) 125 MCG tablet TAKE 1 TABLET EVERY DAY DAILY BEFORE BREAKFAST 90 tablet 1  . lisinopril (PRINIVIL,ZESTRIL) 2.5 MG tablet Take 1 tablet (2.5 mg total) by mouth daily. 30 tablet 12  . metFORMIN (GLUCOPHAGE) 1000 MG tablet TAKE 1 TABLET (1,000 MG TOTAL) BY MOUTH 2 (TWO) TIMES DAILY WITH A MEAL. 180 tablet 3  . metolazone (ZAROXOLYN) 2.5 MG tablet Take 1 tablet ONCE A WEEK ONLY; take 30 minutes before taking Lasix 30 tablet 3  . metoprolol succinate (TOPROL-XL) 50 MG 24 hr tablet TAKE 1 TABLET BY MOUTH DAILY. TAKE WITH OR IMMEDIATELY FOLLOWING A MEAL. 30 tablet 9  . Multiple Vitamin (MULTIVITAMIN WITH MINERALS) TABS tablet Take 1 tablet by mouth daily.     Marland Kitchen NOVOTWIST 32G X 5 MM MISC USE ONE PER DAY TO INJECT VICTOZA 100 each 2  . ONETOUCH DELICA LANCETS 78I MISC Use to check blood sugar 2 times per day dx code E11.49 100 each 5  . potassium chloride  SA (K-DUR,KLOR-CON) 20 MEQ tablet Take 2 tablets (40 mEq total) by mouth daily. 180 tablet 3  . Pramipexole Dihydrochloride (MIRAPEX ER) 1.5 MG TB24 Take 1 tablet (1.5 mg total) by mouth daily. 90 tablet 1  . rosuvastatin (CRESTOR) 20 MG tablet Take 1 tablet (20 mg total) by mouth daily. 90 tablet 3  . traMADol (ULTRAM) 50 MG tablet TAKE 1 TABLET BY MOUTH TWICE A DAY AS NEEDED 60 tablet 3  . travoprost, benzalkonium, (TRAVATAN) 0.004 % ophthalmic solution Place 1 drop into the right eye at bedtime.     . trihexyphenidyl (ARTANE) 2 MG tablet TAKE 1 TABLET TWICE A DAY 180 tablet 1  . VICTOZA 18 MG/3ML SOPN INJECT 1.'8MG'$  ONCE DAILY 27 mL 1  . vitamin B-12 (CYANOCOBALAMIN) 1000 MCG tablet Take  1,000 mcg by mouth daily.    . nitroGLYCERIN (NITROSTAT) 0.4 MG SL tablet Place 1 tablet (0.4 mg total) under the tongue every 5 (five) minutes as needed for chest pain. 25 tablet 2   No current facility-administered medications for this visit.      Past Medical History:  Diagnosis Date  . AICD (automatic cardioverter/defibrillator) present    Dr Lovena Le office visit yearly  . Arthritis    cane  . Asthma   . Benign neoplasm of colon   . CAD (coronary artery disease)   . Cardiomyopathy   . Complication of anesthesia    pt states that he got a rash  . Constipation   . 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 GERD (gastroesophageal reflux disease)   . Glaucoma    right eye  . HLD (hyperlipidemia)   . HTN (hypertension)    pt denies 08/19/12  . Hyperplasia, prostate   . Hyperthyroidism    thyroid lobectomy  . MI (myocardial infarction) [facility])    Dr Stanford Breed 2000, x3vessels bypass  . Nephrolithiasis   . OSA (obstructive sleep apnea)    AHI-28,on CPAP, noncompliant with CPAP  . Parkinson disease ([place])    1999  . PONV (postoperative nausea and vomiting)   . Restless legs   . Shortness of breath    Hx: of at all times  . Sleep apnea    does not use CPAP  . Sleep apnea, organic   . UTI (lower urinary tract infection) 09/15/12   Klebsiella  . Ventral hernia     ROS:   All systems reviewed and negative except as noted in the HPI.   Past Surgical History:  Procedure Laterality Date  . 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 CARDIAC CATHETERIZATION    . CATARACT EXTRACTION W/ INTRAOCULAR LENS IMPLANT     Hx: of right eye  . COLONOSCOPY N/A 10/13/2014   Procedure: COLONOSCOPY;  Surgeon: Jerene Bears, MD;  Location: [facility];  Service: Gastroenterology;  Laterality: N/A;  . COLONOSCOPY W/ BIOPSIES AND POLYPECTOMY     Hx: of  . CORONARY ARTERY BYPASS GRAFT  2000   [name], MD  .  [place]  . DEEP BRAIN STIMULATOR PLACEMENT  2004   Right and left VIN stimulator placement (parkinsons)  . EYE SURGERY    . FINGER AMPUTATION     left pointer  . IMPLANTABLE CARDIOVERTER DEFIBRILLATOR IMPLANT N/A 11/13/2013   Procedure: IMPLANTABLE CARDIOVERTER DEFIBRILLATOR IMPLANT;  Surgeon: Evans Lance, MD;  Location: [facility] CATH  LAB;  Service: Cardiovascular;  Laterality: N/A;  . INSERT / REPLACE / REMOVE PACEMAKER    . LEFT AND RIGHT HEART CATHETERIZATION WITH CORONARY ANGIOGRAM N/A 09/24/2013   Procedure: LEFT AND RIGHT HEART CATHETERIZATION WITH CORONARY ANGIOGRAM;  Surgeon: Burnell Blanks, MD;  Location: [facility] CATH LAB;  Service: Cardiovascular;  Laterality: N/A;  . LITHOTRIPSY     3 different times  . MEDIAN STERNOTOMY  2000  . SUBTHALAMIC STIMULATOR BATTERY REPLACEMENT N/A 09/05/2012   Procedure: Deep brain stimulator battery change;  Surgeon: Erline Levine, MD;  Location: [place] NEURO ORS;  Service: Neurosurgery;  Laterality: N/A;  Deep brain stimulator battery change  . SUBTHALAMIC STIMULATOR BATTERY REPLACEMENT N/A 06/10/2015   Procedure: Deep Brain stimulator battery change;  Surgeon: Erline Levine, MD;  Location: [place] NEURO ORS;  Service: Neurosurgery;  Laterality: N/A;  . TONSILLECTOMY       Family History  Problem Relation Age of Onset  . Aneurysm Mother   . Alcoholism Father   . Autoimmune disease Brother 73    AIDS  . Peripheral vascular disease    . Arthritis    . Diabetes Neg Hx   . Heart disease Neg Hx   . Colon cancer Neg Hx   . Prostate cancer Neg Hx      Social History   Social History  . Marital status: Married    Spouse name: [name]  . Number of children: 2  . Years of education: N/A   Occupational History  . 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 Smoking status: Never Smoker  . Smokeless tobacco: Never Used  . Alcohol use Yes     Comment: occasional beer  . Drug use: No  . Sexual activity:  Not on file   Other Topics Concern  . Not on file   Social History Narrative   From [facility]   Retired/disability Clinical research associate   Likes to fish.     Married 1972   3 kids   [place] fan     BP 106/66   Pulse 79   Ht '5\' 9"'$  (1.753 m)   Wt 259 lb (117.5 kg)   SpO2 92%   BMI 38.25 kg/m   Physical Exam:  Well appearing 66 yo woman, NAD HEENT: Unremarkable Neck:  7 cm JVD, no thyromegally Back:  No CVA tenderness Lungs:  Clear with no wheezes. Minimal basilar rales HEART:  Regular rate rhythm, no murmurs, no rubs, no clicks Abd:  soft, positive bowel sounds, no organomegally, no rebound, no guarding Ext:  2 plus pulses, 1+ edema, no cyanosis, no clubbing Skin:  No rashes no nodules Neuro:  CN II through XII intact, motor grossly intact  ECG - NSR with NSSTT changes.  DEVICE  Normal device function.  See PaceArt for details.   Assess/Plan: 1. Chronic systolic heart failure - his symptoms are fairly well compensated. I have asked him to take metolazone 2.5 mg once a week and take an extra potassium when he takes the extra metolazone. He is encouraged to maintain a low sodium diet. 2. HTN - his blood pressure is well controlled. 3. ICD - his Medtronic DDD MRI compatible ICD is working normally. 4. CAD - he denies anginal symptoms, s/p CABG  Mikle Bosworth.D.

## 2016-05-02 ENCOUNTER — Encounter: Payer: Self-pay | Admitting: Cardiology

## 2016-05-16 NOTE — Progress Notes (Signed)
HPI: FU coronary artery disease status post bypass and graft, Parkinson's, diabetes and hypertension as well as hyperlipidemia. Carotid Dopplers in January 2005 showed 0-39% stenosis. Last nuclear study 6/15 showed EF 41, prior MI; no ischemia. Echo 8/15 showed EF 30-35, moderate LAE. TSH 7/15 10.27. Cardiac catheterization September 2015 showed a pulmonary Wedge pressure of 16 and PA pressure 32/20. The LAD was occluded. No obstructive disease in the circumflex. The right coronary had severe diffuse distal disease. Saphenous vein graft to the PDA was patent. Saphenous vein graft to diagonal patent. LIMA to the mid LAD patent. Medical therapy recommended. Had ICD placed November 2015. Since last seen, he has some dyspnea on exertion. Mild chest tightness with vigorous activities which is unchanged. Mild pedal edema. No syncope.  Current Outpatient Prescriptions  Medication Sig Dispense Refill  . acetaminophen (TYLENOL) 325 MG tablet Take 1-2 tablets (325-650 mg total) by mouth every 4 (four) hours as needed for mild pain.    Marland Kitchen albuterol (PROVENTIL HFA;VENTOLIN HFA) 108 (90 Base) MCG/ACT inhaler Inhale 2 puffs into the lungs every 6 (six) hours as needed for wheezing or shortness of breath. 18 g 5  . aspirin EC 81 MG tablet Take 81 mg by mouth daily.    . benzonatate (TESSALON) 200 MG capsule Take 1 capsule (200 mg total) by mouth 2 (two) times daily as needed for cough. 20 capsule 0  . Fluticasone-Salmeterol (ADVAIR) 250-50 MCG/DOSE AEPB Inhale 1 puff into the lungs 2 (two) times daily. 60 each 5  . furosemide (LASIX) 80 MG tablet TAKE 1.5 TABLETS BY MOUTH EVERY DAY 135 tablet 1  . glimepiride (AMARYL) 4 MG tablet TAKE 1 TABLET BY MOUTH EVERY DAY IN THE MORNING AND 1/2 AT SUPPER 135 tablet 1  . glucose blood (ONETOUCH VERIO) test strip Use as instructed to check blood sugar 2 times a day Dx code E11.49 100 each 5  . HUMALOG 100 UNIT/ML injection USE MAX 56 UNITS PER DAY   WITH V-GO PUMP 60 mL 1   . ibuprofen (ADVIL,MOTRIN) 200 MG tablet Take 800 mg by mouth every 6 (six) hours as needed (For pain.).     . Insulin Disposable Pump (V-GO 20) KIT USE ONE PER DAY 1 kit 5  . levothyroxine (SYNTHROID, LEVOTHROID) 125 MCG tablet TAKE 1 TABLET EVERY DAY DAILY BEFORE BREAKFAST 90 tablet 1  . lisinopril (PRINIVIL,ZESTRIL) 2.5 MG tablet Take 1 tablet (2.5 mg total) by mouth daily. 30 tablet 12  . metFORMIN (GLUCOPHAGE) 1000 MG tablet TAKE 1 TABLET (1,000 MG TOTAL) BY MOUTH 2 (TWO) TIMES DAILY WITH A MEAL. 180 tablet 3  . metolazone (ZAROXOLYN) 2.5 MG tablet Take 1 tablet ONCE A WEEK ONLY; take 30 minutes before taking Lasix 30 tablet 3  . metoprolol succinate (TOPROL-XL) 50 MG 24 hr tablet TAKE 1 TABLET BY MOUTH DAILY. TAKE WITH OR IMMEDIATELY FOLLOWING A MEAL. 30 tablet 9  . Multiple Vitamin (MULTIVITAMIN WITH MINERALS) TABS tablet Take 1 tablet by mouth daily.     Marland Kitchen NOVOTWIST 32G X 5 MM MISC USE ONE PER DAY TO INJECT VICTOZA 100 each 2  . ONETOUCH DELICA LANCETS 28M MISC Use to check blood sugar 2 times per day dx code E11.49 100 each 5  . potassium chloride SA (K-DUR,KLOR-CON) 20 MEQ tablet Take 2 tablets (40 mEq total) by mouth daily. 180 tablet 3  . Pramipexole Dihydrochloride (MIRAPEX ER) 1.5 MG TB24 Take 1 tablet (1.5 mg total) by mouth daily. 90 tablet 1  .  rosuvastatin (CRESTOR) 20 MG tablet Take 1 tablet (20 mg total) by mouth daily. 90 tablet 3  . traMADol (ULTRAM) 50 MG tablet TAKE 1 TABLET BY MOUTH TWICE A DAY AS NEEDED 60 tablet 3  . travoprost, benzalkonium, (TRAVATAN) 0.004 % ophthalmic solution Place 1 drop into the right eye at bedtime.     . trihexyphenidyl (ARTANE) 2 MG tablet TAKE 1 TABLET TWICE A DAY 180 tablet 1  . VICTOZA 18 MG/3ML SOPN INJECT 1.'8MG'$  ONCE DAILY 27 mL 1  . vitamin B-12 (CYANOCOBALAMIN) 1000 MCG tablet Take 1,000 mcg by mouth daily.    . nitroGLYCERIN (NITROSTAT) 0.4 MG SL tablet Place 1 tablet (0.4 mg total) under the tongue every 5 (five) minutes as needed for  chest pain. 25 tablet 2   No current facility-administered medications for this visit.      Past Medical History:  Diagnosis Date  . AICD (automatic cardioverter/defibrillator) present    Dr Lovena Le office visit yearly  . Arthritis    cane  . Asthma   . Benign neoplasm of colon   . CAD (coronary artery disease)   . Cardiomyopathy   . Complication of anesthesia    pt states that he got a rash  . Constipation   . 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 GERD (gastroesophageal reflux disease)   . Glaucoma    right eye  . HLD (hyperlipidemia)   . HTN (hypertension)    pt denies 08/19/12  . Hyperplasia, prostate   . Hyperthyroidism    thyroid lobectomy  . MI (myocardial infarction) [facility])    Dr Stanford Breed 2000, x3vessels bypass  . Nephrolithiasis   . OSA (obstructive sleep apnea)    AHI-28,on CPAP, noncompliant with CPAP  . Parkinson disease ([place])    1999  . PONV (postoperative nausea and vomiting)   . Restless legs   . Shortness of breath    Hx: of at all times  . Sleep apnea    does not use CPAP  . Sleep apnea, organic   . UTI (lower urinary tract infection) 09/15/12   Klebsiella  . Ventral hernia     Past Surgical History:  Procedure Laterality Date  . 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 CARDIAC CATHETERIZATION    . CATARACT EXTRACTION W/ INTRAOCULAR LENS IMPLANT     Hx: of right eye  . COLONOSCOPY N/A 10/13/2014   Procedure: COLONOSCOPY;  Surgeon: Jerene Bears, MD;  [facility];  Service: Gastroenterology;  Laterality: N/A;  . COLONOSCOPY W/ BIOPSIES AND POLYPECTOMY     Hx: of  . CORONARY ARTERY BYPASS GRAFT  2000   [name], MD  . [place]  . DEEP BRAIN STIMULATOR PLACEMENT  2004   Right and left VIN stimulator placement (parkinsons)  . EYE SURGERY    . FINGER AMPUTATION     left pointer  . IMPLANTABLE CARDIOVERTER DEFIBRILLATOR IMPLANT  N/A 11/13/2013   Procedure: IMPLANTABLE CARDIOVERTER DEFIBRILLATOR IMPLANT;  Surgeon: Evans Lance, MD;  Location: [facility] CATH LAB;  Service: Cardiovascular;  Laterality: N/A;  . INSERT / REPLACE / REMOVE PACEMAKER    . LEFT AND RIGHT HEART CATHETERIZATION WITH CORONARY ANGIOGRAM N/A 09/24/2013   Procedure: LEFT AND RIGHT HEART CATHETERIZATION WITH CORONARY ANGIOGRAM;  Surgeon: Burnell Blanks, MD;  Location: [facility] CATH LAB;  Service: Cardiovascular;  Laterality:  N/A;  . LITHOTRIPSY     3 different times  . MEDIAN STERNOTOMY  2000  . SUBTHALAMIC STIMULATOR BATTERY REPLACEMENT N/A 09/05/2012   Procedure: Deep brain stimulator battery change;  Surgeon: Erline Levine, MD;  Location: [place] NEURO ORS;  Service: Neurosurgery;  Laterality: N/A;  Deep brain stimulator battery change  . SUBTHALAMIC STIMULATOR BATTERY REPLACEMENT N/A 06/10/2015   Procedure: Deep Brain stimulator battery change;  Surgeon: Erline Levine, MD;  Location: [place] NEURO ORS;  Service: Neurosurgery;  Laterality: N/A;  . TONSILLECTOMY      Social History   Social History  . Marital status: Married    Spouse name: [name]  . Number of children: 2  . Years of education: N/A   Occupational History  . 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 Smoking status: Never Smoker  . Smokeless tobacco: Never Used  . Alcohol use Yes     Comment: occasional beer  . Drug use: No  . Sexual activity: Not on file   Other Topics Concern  . Not on file   Social History Narrative   From [facility]   Retired/disability Clinical research associate   Likes to fish.     Married 1972   3 kids   [place] fan    Family History  Problem Relation Age of Onset  . Aneurysm Mother   . Alcoholism Father   . Autoimmune disease Brother 35       AIDS  . Peripheral vascular disease Unknown   . Arthritis Unknown   . Diabetes Neg Hx   . Heart disease Neg Hx   . Colon cancer Neg Hx   . Prostate cancer Neg Hx     ROS: no  fevers or chills, productive cough, hemoptysis, dysphasia, odynophagia, melena, hematochezia, dysuria, hematuria, rash, seizure activity, orthopnea, PND, pedal edema, claudication. Remaining systems are negative.  Physical Exam: Well-developed obese in no acute distress.  Skin is warm and dry.  HEENT is normal.  Neck is supple.  Chest is clear to auscultation with normal expansion.  Cardiovascular exam is regular rate and rhythm.  Abdominal exam nontender or distended. No masses palpated. Extremities show 1+ edema. neuro c/w Parkinsons  ECG-  sinus rhythm, normal axis, cannot rule out prior anterior infarct, lateral T-wave inversion. personally reviewed  A/P  1 coronary artery disease-continue aspirin and statin.  2 ischemic cardiomyopathy- continue lisinopril and metoprolol. I discussed possibly adding entresto but they are concerned about cost; they will check with her insurance company and if affordable we will transition.  3 hypertension-blood pressure controlled. Continue present medications.  4 hyperlipidemia-continue statin.  5 chronic systolic congestive heart failure-patient is mildly volume overloaded. I have asked him to take an additional metolazone on Wednesday of this week. He took one today as well. Continue Lasix. Check potassium and renal function. We discussed low-sodium diet. He will weigh daily and take an additional metolazone for weight gain of 2-3 pounds.  6 prior ICD-followed by electrophysiology.  Kirk Ruths, MD

## 2016-05-22 ENCOUNTER — Ambulatory Visit (INDEPENDENT_AMBULATORY_CARE_PROVIDER_SITE_OTHER): Payer: Medicare Other | Admitting: Cardiology

## 2016-05-22 ENCOUNTER — Encounter: Payer: Self-pay | Admitting: Cardiology

## 2016-05-22 VITALS — BP 112/74 | HR 79 | Ht 69.0 in | Wt 260.0 lb

## 2016-05-22 DIAGNOSIS — E7800 Pure hypercholesterolemia, unspecified: Secondary | ICD-10-CM | POA: Diagnosis not present

## 2016-05-22 DIAGNOSIS — I255 Ischemic cardiomyopathy: Secondary | ICD-10-CM

## 2016-05-22 DIAGNOSIS — I2510 Atherosclerotic heart disease of native coronary artery without angina pectoris: Secondary | ICD-10-CM | POA: Diagnosis not present

## 2016-05-22 DIAGNOSIS — I10 Essential (primary) hypertension: Secondary | ICD-10-CM | POA: Diagnosis not present

## 2016-05-22 DIAGNOSIS — I5022 Chronic systolic (congestive) heart failure: Secondary | ICD-10-CM

## 2016-05-22 DIAGNOSIS — I4891 Unspecified atrial fibrillation: Secondary | ICD-10-CM | POA: Diagnosis not present

## 2016-05-22 NOTE — Patient Instructions (Signed)
Medication Instructions:   ENTRESTO FOR HEART FAILURE  Labwork:  Your physician recommends that you HAVE LAB WORK TODAY  Follow-Up:  Your physician wants you to follow-up in: [place] will receive a reminder letter in the mail two months in advance. If you don't receive a letter, please call our office to schedule the follow-up appointment.   If you need a refill on your cardiac medications before your next appointment, please call your pharmacy.

## 2016-05-23 LAB — BASIC METABOLIC PANEL
BUN: 23 mg/dL (ref 7–25)
CHLORIDE: 98 mmol/L (ref 98–110)
CO2: 29 mmol/L (ref 20–31)
CREATININE: 1.53 mg/dL — AB (ref 0.70–1.25)
Calcium: 8.9 mg/dL (ref 8.6–10.3)
GLUCOSE: 216 mg/dL — AB (ref 65–99)
POTASSIUM: 4.1 mmol/L (ref 3.5–5.3)
Sodium: 140 mmol/L (ref 135–146)

## 2016-05-26 ENCOUNTER — Other Ambulatory Visit: Payer: Self-pay | Admitting: Neurology

## 2016-05-29 DIAGNOSIS — M47817 Spondylosis without myelopathy or radiculopathy, lumbosacral region: Secondary | ICD-10-CM | POA: Diagnosis not present

## 2016-05-29 DIAGNOSIS — M25551 Pain in right hip: Secondary | ICD-10-CM | POA: Diagnosis not present

## 2016-05-29 DIAGNOSIS — M545 Low back pain: Secondary | ICD-10-CM | POA: Diagnosis not present

## 2016-05-29 DIAGNOSIS — M48061 Spinal stenosis, lumbar region without neurogenic claudication: Secondary | ICD-10-CM | POA: Diagnosis not present

## 2016-05-30 ENCOUNTER — Other Ambulatory Visit: Payer: Self-pay | Admitting: Physical Medicine and Rehabilitation

## 2016-05-30 DIAGNOSIS — M4800 Spinal stenosis, site unspecified: Secondary | ICD-10-CM

## 2016-06-01 ENCOUNTER — Ambulatory Visit
Admission: RE | Admit: 2016-06-01 | Discharge: 2016-06-01 | Disposition: A | Discharge status: Home / Self Care | Payer: Federal, State, Local not specified - PPO | Source: Ambulatory Visit | Attending: Physical Medicine and Rehabilitation | Admitting: Physical Medicine and Rehabilitation

## 2016-06-01 DIAGNOSIS — M5126 Other intervertebral disc displacement, lumbar region: Secondary | ICD-10-CM | POA: Diagnosis not present

## 2016-06-01 DIAGNOSIS — M4800 Spinal stenosis, site unspecified: Secondary | ICD-10-CM

## 2016-06-01 MED ORDER — DIAZEPAM 5 MG PO TABS
10.0000 mg | ORAL_TABLET | Freq: Once | ORAL | Status: AC
  Administered 2016-06-01: 10 mg via ORAL

## 2016-06-01 MED ORDER — IOPAMIDOL (ISOVUE-M 200) INJECTION 41%
15.0000 mL | Freq: Once | INTRAMUSCULAR | Status: AC
  Administered 2016-06-01: 15 mL via INTRATHECAL

## 2016-06-01 MED ORDER — ACETAMINOPHEN 500 MG PO TABS
1000.0000 mg | ORAL_TABLET | Freq: Once | ORAL | Status: AC
  Administered 2016-06-01: 1000 mg via ORAL

## 2016-06-01 NOTE — Discharge Instructions (Signed)
Myelogram Discharge Instructions  1. Go home and rest quietly for the next 24 hours.  It is important to lie flat for the next 24 hours.  Get up only to go to the restroom.  You may lie in the bed or on a couch on your back, your stomach, your left side or your right side.  You may have one pillow under your head.  You may have pillows between your knees while you are on your side or under your knees while you are on your back.  2. DO NOT drive today.  Recline the seat as far back as it will go, while still wearing your seat belt, on the way home.  3. You may get up to go to the bathroom as needed.  You may sit up for 10 minutes to eat.  You may resume your normal diet and medications unless otherwise indicated.  Drink lots of extra fluids today and tomorrow.  4. The incidence of headache, nausea, or vomiting is about 5% (one in 20 patients).  If you develop a headache, lie flat and drink plenty of fluids until the headache goes away.  Caffeinated beverages may be helpful.  If you develop severe nausea and vomiting or a headache that does not go away with flat bed rest, call [phone].  5. You may resume normal activities after your 24 hours of bed rest is over; however, do not exert yourself strongly or do any heavy lifting tomorrow. If when you get up you have a headache when standing, go back to bed and force fluids for another 24 hours.  6. Call your physician for a follow-up appointment.  The results of your myelogram will be sent directly to your physician by the following day.  7. If you have any questions or if complications develop after you arrive home, please call [phone].  Discharge instructions have been explained to the patient.  The patient, or the person responsible for the patient, fully understands these instructions.       May resume Tramadol on Jun 02, 2016, after 11:30 am

## 2016-06-01 NOTE — Progress Notes (Signed)
Patient states he has been off Tramadol for at least the past two days.  Chabeli Barsamian, RN 

## 2016-06-09 ENCOUNTER — Other Ambulatory Visit: Payer: Self-pay | Admitting: Family Medicine

## 2016-06-12 DIAGNOSIS — M545 Low back pain: Secondary | ICD-10-CM | POA: Diagnosis not present

## 2016-06-12 DIAGNOSIS — M48061 Spinal stenosis, lumbar region without neurogenic claudication: Secondary | ICD-10-CM | POA: Diagnosis not present

## 2016-06-12 DIAGNOSIS — M47817 Spondylosis without myelopathy or radiculopathy, lumbosacral region: Secondary | ICD-10-CM | POA: Diagnosis not present

## 2016-06-13 ENCOUNTER — Telehealth: Payer: Self-pay

## 2016-06-13 NOTE — Telephone Encounter (Signed)
Advised the patient's wife that he will put on the second V-go pump and also double his boluses, continue extra insulin until blood sugars are near normal

## 2016-06-13 NOTE — Telephone Encounter (Signed)
Called patient and went over message again that Dr. Dwyane Dee went over with patients wife.

## 2016-06-13 NOTE — Telephone Encounter (Signed)
Patient wife called and states that pt got a steroid shot and is taking a steroid right now, he had another type of shot yesterday.   06/13/16- 241 Am- 288 1:47 am- 342  06/12/16- 9:14pm- 374 9:30pm- 415 12:43pm- 301 8:18am- 351 7:35am- 337   06/11/16- 5:47am-235  06/10/16- 211   Please advise on how to proceed. Let Jinny Blossom know, thank you!

## 2016-06-14 ENCOUNTER — Other Ambulatory Visit: Payer: Self-pay | Admitting: Family Medicine

## 2016-06-14 NOTE — Telephone Encounter (Signed)
Received refill request electronically Last refill 01/30/16 #60/3 Last office visit 02/23/16/acute

## 2016-06-15 NOTE — Telephone Encounter (Signed)
Please call in.  Thanks.   

## 2016-06-15 NOTE — Telephone Encounter (Signed)
Medication phoned to pharmacy.  

## 2016-06-16 ENCOUNTER — Ambulatory Visit: Payer: Medicare Other | Admitting: Neurology

## 2016-07-04 ENCOUNTER — Encounter: Payer: Self-pay | Admitting: Endocrinology

## 2016-07-04 ENCOUNTER — Ambulatory Visit (INDEPENDENT_AMBULATORY_CARE_PROVIDER_SITE_OTHER): Payer: Medicare Other | Admitting: Endocrinology

## 2016-07-04 VITALS — BP 136/82 | HR 75 | Ht 69.0 in | Wt 259.2 lb

## 2016-07-04 DIAGNOSIS — Z794 Long term (current) use of insulin: Secondary | ICD-10-CM

## 2016-07-04 DIAGNOSIS — E782 Mixed hyperlipidemia: Secondary | ICD-10-CM

## 2016-07-04 DIAGNOSIS — I255 Ischemic cardiomyopathy: Secondary | ICD-10-CM

## 2016-07-04 DIAGNOSIS — E1165 Type 2 diabetes mellitus with hyperglycemia: Secondary | ICD-10-CM | POA: Diagnosis not present

## 2016-07-04 DIAGNOSIS — M48061 Spinal stenosis, lumbar region without neurogenic claudication: Secondary | ICD-10-CM | POA: Diagnosis not present

## 2016-07-04 DIAGNOSIS — M47817 Spondylosis without myelopathy or radiculopathy, lumbosacral region: Secondary | ICD-10-CM | POA: Diagnosis not present

## 2016-07-04 DIAGNOSIS — M545 Low back pain: Secondary | ICD-10-CM | POA: Diagnosis not present

## 2016-07-04 LAB — POCT GLYCOSYLATED HEMOGLOBIN (HGB A1C): Hemoglobin A1C: 7.9

## 2016-07-04 NOTE — Patient Instructions (Signed)
More sugars after meals and at nite  Switch to 30 unit pump and change every 24 hrs

## 2016-07-04 NOTE — Progress Notes (Signed)
Patient ID: Henry [name], male   DOB: 16-Aug-1950, 66 y.o.   MRN: [id number]           Reason for Appointment: Follow-up for Type 2 Diabetes   History of Present Illness:          Diagnosis: Type 2 diabetes mellitus, date of diagnosis: 2000      Past history:   Patient thinks he has been taking Amaryl for several years and probably metformin since onset also At some point he was changed from metformin to [facility] and was taking this since at least 2012 A1c had been higher in 2015 Since his A1c had been progressively higher with his regimen of Janumet and Amaryl he was started on Victoza in 01/2014 He was started on Lantus insulin in 5/16 because of persistent hyperglycemia especially fasting; was having readings as high as 293 Because of tendency to high postprandial readings and for control with Lantus he was switched to the V-go pump in  09/2014  Recent history:   INSULIN regimen: V-go-20 pump and boluses 4-10 units; 10 U at dinner  Non-insulin hypoglycemic drugs the patient is taking are: Metformin 1 g twice a day, Victoza 1.8 mg daily in am   However A1c is now 7.9, previously 6.9  Current management, blood sugar patterns and problems identified:  He had a steroid injection in his spine about 4 weeks ago and blood sugars went up dramatically to as high as 434  He was told to use 2 of the 20 units pump at the same time for a couple of days; this brought his blood sugar down but he subsequently has had persistently high sugars  Again he is checking his blood sugars primarily in the morning and not much in the evening especially recently  Also no blood sugars after breakfast or lunch  His wife says that he is eating all the time and is not motivated to improve his diet despite talking to the dietitian and taking Victoza  He was bolus for snacks also and sometimes will use up all his 18 clicks  Weight is about the same  He has seen the dietitian in 3/16 Not able to  exercise because of back pain and musculoskeletal issues and balance       Side effects from medications have been: None Compliance with the medical regimen: Fair  Hypoglycemia:   none  Glucose monitoring:  done 1-2 times  a day         Glucometer: One Touch ultra 2 .      Blood Glucose readings by   Mean values apply above for all meters except median for One Touch  PRE-MEAL Fasting Lunch Dinner Bedtime Overall  Glucose range: 123-351   138-434   Mean/median: 211   235  219     Self-care: The diet that the patient has been following AQ:TMAU, some fried food and cookies, mostly sugar-free         Meals: 9 am , 2-3 pm and 7 pm     Exercise: unable to do any         Dietician visit, most recent: 03/2014                Weight history:   Wt Readings from Last 3 Encounters:  07/04/16 259 lb 3.2 oz (117.6 kg)  05/22/16 260 lb (117.9 kg)  04/26/16 259 lb (117.5 kg)    Glycemic control:   Lab Results  Component Value Date  HGBA1C 6.9 (H) 03/29/2016   HGBA1C 8.0 (H) 12/27/2015   HGBA1C 6.9 (H) 09/16/2015   Lab Results  Component Value Date   MICROALBUR 1.1 12/27/2015   LDLCALC 118 (H) 09/16/2015   CREATININE 1.53 (H) 05/22/2016   Other active problems: See review of systems  No visits with results within 1 Week(s) from this visit.  Latest known visit with results is:  Office Visit on 05/22/2016  Component Date Value Ref Range Status  . Sodium 05/22/2016 140  135 - 146 mmol/L Final  . Potassium 05/22/2016 4.1  3.5 - 5.3 mmol/L Final  . Chloride 05/22/2016 98  98 - 110 mmol/L Final  . CO2 05/22/2016 29  20 - 31 mmol/L Final  . Glucose, Bld 05/22/2016 216* 65 - 99 mg/dL Final  . BUN 05/22/2016 23  7 - 25 mg/dL Final  . Creat 05/22/2016 1.53* 0.70 - 1.25 mg/dL Final   Comment:   For patients > or = 65 years of age: The upper reference limit for Creatinine is approximately 13% higher for people identified as African-American.     . Calcium 05/22/2016 8.9  8.6 - 10.3  mg/dL Final      Allergies as of 07/04/2016      Reactions   Penicillins Rash, Other (See Comments)   WEARS ALLERGY BRACELET Because of a history of documented adverse serious drug reaction;Medi Alert bracelet  is recommended Has patient had a PCN reaction causing immediate rash, facial/tongue/throat swelling, SOB or lightheadedness with hypotension: Yes Has patient had a PCN reaction causing severe rash involving mucus membranes or skin necrosis: unknown Has patient had a PCN reaction that required hospitalization NO Has patient had a PCN reaction occurring within the last 10 years: NO If all of    Watermelon [citrullus Vulgaris]    Tickle in throat and cough      Medication List       Accurate as of 07/04/16 12:37 PM. Always use your most recent med list.          acetaminophen 325 MG tablet Commonly known as:  TYLENOL Take 1-2 tablets (325-650 mg total) by mouth every 4 (four) hours as needed for mild pain.   albuterol 108 (90 Base) MCG/ACT inhaler Commonly known as:  PROVENTIL HFA;VENTOLIN HFA Inhale 2 puffs into the lungs every 6 (six) hours as needed for wheezing or shortness of breath.   aspirin EC 81 MG tablet Take 81 mg by mouth daily.   Fluticasone-Salmeterol 250-50 MCG/DOSE Aepb Commonly known as:  ADVAIR Inhale 1 puff into the lungs 2 (two) times daily.   furosemide 80 MG tablet Commonly known as:  LASIX TAKE 1 AND 1/2 TABLET BY MOUTH EVERY DAY   glucose blood test strip Commonly known as:  ONETOUCH VERIO Use as instructed to check blood sugar 2 times a day Dx code E11.49   HUMALOG 100 UNIT/ML injection Generic drug:  insulin lispro USE MAX 56 UNITS PER DAY   WITH V-GO PUMP   ibuprofen 200 MG tablet Commonly known as:  ADVIL,MOTRIN Take 800 mg by mouth every 6 (six) hours as needed (For pain.).   levothyroxine 125 MCG tablet Commonly known as:  SYNTHROID, LEVOTHROID TAKE 1 TABLET EVERY DAY DAILY BEFORE BREAKFAST   lisinopril 2.5 MG  tablet Commonly known as:  PRINIVIL,ZESTRIL Take 1 tablet (2.5 mg total) by mouth daily.   metFORMIN 1000 MG tablet Commonly known as:  GLUCOPHAGE TAKE 1 TABLET (1,000 MG TOTAL) BY MOUTH 2 (TWO) TIMES DAILY WITH  A MEAL.   metolazone 2.5 MG tablet Commonly known as:  ZAROXOLYN Take 1 tablet ONCE A WEEK ONLY; take 30 minutes before taking Lasix   metoprolol succinate 50 MG 24 hr tablet Commonly known as:  TOPROL-XL TAKE 1 TABLET BY MOUTH DAILY. TAKE WITH OR IMMEDIATELY FOLLOWING A MEAL.   multivitamin with minerals Tabs tablet Take 1 tablet by mouth daily.   nitroGLYCERIN 0.4 MG SL tablet Commonly known as:  NITROSTAT Place 1 tablet (0.4 mg total) under the tongue every 5 (five) minutes as needed for chest pain.   NOVOTWIST 32G X 5 MM Misc Generic drug:  Insulin Pen Needle USE ONE PER DAY TO INJECT VICTOZA   ONETOUCH DELICA LANCETS 81E Misc Use to check blood sugar 2 times per day dx code E11.49   potassium chloride SA 20 MEQ tablet Commonly known as:  KLOR-CON M10 Take 2 tablets (40 mEq total) by mouth daily.   Pramipexole Dihydrochloride 1.5 MG Tb24 TAKE 1 TABLET DAILY   rosuvastatin 20 MG tablet Commonly known as:  CRESTOR Take 1 tablet (20 mg total) by mouth daily.   traMADol 50 MG tablet Commonly known as:  ULTRAM TAKE 1 TABLET BY MOUTH TWICE A DAY AS NEEDED   travoprost (benzalkonium) 0.004 % ophthalmic solution Commonly known as:  TRAVATAN Place 1 drop into the right eye at bedtime.   trihexyphenidyl 2 MG tablet Commonly known as:  ARTANE TAKE 1 TABLET TWICE A DAY   V-GO 20 Kit USE ONE PER DAY   VICTOZA 18 MG/3ML Sopn Generic drug:  liraglutide INJECT 1.8MG ONCE DAILY   vitamin B-12 1000 MCG tablet Commonly known as:  CYANOCOBALAMIN Take 1,000 mcg by mouth daily.       Allergies:  Allergies  Allergen Reactions  . Penicillins Rash and Other (See Comments)    WEARS ALLERGY BRACELET Because of a history of documented adverse serious drug  reaction;Medi Alert bracelet  is recommended Has patient had a PCN reaction causing immediate rash, facial/tongue/throat swelling, SOB or lightheadedness with hypotension: Yes Has patient had a PCN reaction causing severe rash involving mucus membranes or skin necrosis: unknown Has patient had a PCN reaction that required hospitalization NO Has patient had a PCN reaction occurring within the last 10 years: NO If all of   . Watermelon [Citrullus Vulgaris]     Tickle in throat and cough    Past Medical History:  Diagnosis Date  . AICD (automatic cardioverter/defibrillator) present    Dr Lovena Le office visit yearly  . Arthritis    cane  . Asthma   . Benign neoplasm of colon   . CAD (coronary artery disease)   . Cardiomyopathy   . Complication of anesthesia    pt states that he got a rash  . Constipation   . 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 DM (diabetes mellitus) ([place])    TYPE 2   . GERD (gastroesophageal reflux disease)   . Glaucoma    right eye  . HLD (hyperlipidemia)   . HTN (hypertension)    pt denies 08/19/12  . Hyperplasia, prostate   . Hyperthyroidism    thyroid lobectomy  . MI (myocardial infarction) [facility])    Dr Stanford Breed 2000, x3vessels bypass  . Nephrolithiasis   . OSA (obstructive sleep apnea)    AHI-28,on CPAP, noncompliant with CPAP  . Parkinson disease ([place])    1999  . PONV (postoperative nausea and vomiting)   . Restless legs   .  Shortness of breath    Hx: of at all times  . Sleep apnea    does not use CPAP  . Sleep apnea, organic   . UTI (lower urinary tract infection) 09/15/12   Klebsiella  . Ventral hernia     Past Surgical History:  Procedure Laterality Date  . 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 CARDIAC CATHETERIZATION    . CATARACT EXTRACTION W/ INTRAOCULAR LENS IMPLANT     Hx: of right eye  . COLONOSCOPY N/A 10/13/2014   Procedure: COLONOSCOPY;  Surgeon: Jerene Bears, MD;  Location: [facility] ENDOSCOPY;   Service: Gastroenterology;  Laterality: N/A;  . COLONOSCOPY W/ BIOPSIES AND POLYPECTOMY     Hx: of  . CORONARY ARTERY BYPASS GRAFT  2000   [name], MD  . [place]  . DEEP BRAIN STIMULATOR PLACEMENT  2004   Right and left VIN stimulator placement (parkinsons)  . EYE SURGERY    . FINGER AMPUTATION     left pointer  . IMPLANTABLE CARDIOVERTER DEFIBRILLATOR IMPLANT N/A 11/13/2013   Procedure: IMPLANTABLE CARDIOVERTER DEFIBRILLATOR IMPLANT;  Surgeon: Evans Lance, MD;  Location: [facility] CATH LAB;  Service: Cardiovascular;  Laterality: N/A;  . INSERT / REPLACE / REMOVE PACEMAKER    . LEFT AND RIGHT HEART CATHETERIZATION WITH CORONARY ANGIOGRAM N/A 09/24/2013   Procedure: LEFT AND RIGHT HEART CATHETERIZATION WITH CORONARY ANGIOGRAM;  Surgeon: Burnell Blanks, MD;  Location: [facility] CATH LAB;  Service: Cardiovascular;  Laterality: N/A;  . LITHOTRIPSY     3 different times  . MEDIAN STERNOTOMY  2000  . SUBTHALAMIC STIMULATOR BATTERY REPLACEMENT N/A 09/05/2012   Procedure: Deep brain stimulator battery change;  Surgeon: Erline Levine, MD;  Location: [facility]URO ORS;  Service: Neurosurgery;  Laterality: N/A;  Deep brain stimulator battery change  . SUBTHALAMIC STIMULATOR BATTERY REPLACEMENT N/A 06/10/2015   Procedure: Deep Brain stimulator battery change;  Surgeon: Erline Levine, MD;  Location: [facility]UR[facility] ORS;  Service: Neurosurgery;  Laterality: N/A;  . TONSILLECTOMY      Family History  Problem Relation Age of Onset  . Aneurysm Mother   . Alcoholism Father   . Autoimmune disease Brother 74       AIDS  . Peripheral vascular disease Unknown   . Arthritis Unknown   . Diabetes Neg Hx   . Heart disease Neg Hx   . Colon cancer Neg Hx   . Prostate cancer Neg Hx     Social History:  reports that he has never smoked. He has never used smokeless tobacco. He reports that he drinks alcohol. He reports that he does not use drugs.    Review of Systems     Most recent eye exam  was done in 2/18    HYPERLIPIDEMIA: He was switched from Lipitor to Crestor because of muscle aches with Lipitor and LDL is below 70  Needs follow-up      Lab Results  Component Value Date   CHOL 132 12/06/2015   CHOL 182 09/16/2015   CHOL 147 05/19/2014   Lab Results  Component Value Date   HDL 30.20 (L) 12/06/2015   HDL 31.30 (L) 09/16/2015   HDL 35.50 (L) 05/19/2014   Lab Results  Component Value Date   LDLCALC 118 (H) 09/16/2015   LDLCALC 126 (H) 04/08/2013   LDLCALC 51 02/28/2012   Lab Results  Component Value Date   TRIG 217.0 (H) 12/06/2015   TRIG 165.0 (H) 09/16/2015  TRIG 213.0 (H) 05/19/2014   Lab Results  Component Value Date   CHOLHDL 4 12/06/2015   CHOLHDL 6 09/16/2015   CHOLHDL 4 05/19/2014   Lab Results  Component Value Date   LDLDIRECT 69.0 12/06/2015   LDLDIRECT 86.0 05/19/2014   LDLDIRECT 88.7 08/06/2013                  Thyroid:   He has had hypothyroidism following radioactive iodine treatment for hyperthyroidism in 2013  TSH is usually normal with  dosage of 125 g   Lab Results  Component Value Date   TSH 2.35 03/29/2016   TSH 3.58 09/16/2015   TSH 2.06 12/15/2014   FREET4 0.82 08/20/2014   FREET4 0.98 11/20/2011   FREET4 0.91 10/16/2011       The blood pressure has been followed by  other physicians Also on multiple diuretics from cardiologist  Not clear why his kidney function has been fluctuating  Lab Results  Component Value Date   CREATININE 1.53 (H) 05/22/2016   BUN 23 05/22/2016   NA 140 05/22/2016   K 4.1 05/22/2016   CL 98 05/22/2016   CO2 29 05/22/2016     Diabetic foot exam in 03/2016 showed  normal monofilament sensation in the toes except decreased on the right great toe and plantar surfaces, no skin lesions or ulcers on the feet and normal pedal pulses    Physical Examination:  BP 136/82   Pulse 75   Ht _0  (1.753 m)   Wt 259 lb 3.2 oz (117.6 kg)   SpO2 96%   BMI 38.28 kg/m        ASSESSMENT/PLAN  Diabetes type 2, uncontrolled with obesity    See history of present illness for detailed discussion of his current management, blood sugar patterns and problems identified  His blood sugars are High and this may be partly related to his getting an epidural steroid However previously had been doing well with A1c below 7 May be more insulin resistant now Also probably not as motivated to watch his diet and eating fairly constantly all day and night He is taking Victoza but not clear how much this is helping, also unlikely his sugars are higher because of stopping Amaryl  Recommendations: Try 30 unit basal on his pump and a sample of 3 pumps was given If this works and his sugars are at least below 150 in the morning he can use this as a prescription otherwise may even need to try 40 units basal Again reminded him to cut back on  portions, high-fat foods and snacks and sweets He will stay with the same dose of Victoza Emphasized the need to check blood sugars more consistently after meals at various times of the day Will discuss his progress on the phone later this week  Renal dysfunction: This may be related to his variable plasma volume status with his using diuretics and needs to be rechecked May need to adjust metformin if creatinine clearance is below 40  Patient Instructions  More sugars after meals and at nite  Switch to 30 unit pump and change every 24 hrs     Osamu Olguin 07/04/2016, 12:37 PM   Note: This office note was prepared with Dragon voice recognition system technology. Any transcriptional errors that result from this process are unintentional.

## 2016-07-10 ENCOUNTER — Encounter: Payer: Self-pay | Admitting: Endocrinology

## 2016-07-10 ENCOUNTER — Ambulatory Visit: Payer: Medicare Other | Admitting: Neurology

## 2016-07-10 ENCOUNTER — Other Ambulatory Visit: Payer: Self-pay | Admitting: Endocrinology

## 2016-07-12 NOTE — Progress Notes (Signed)
Henry [name] was seen today in the movement disorders clinic for neurologic consultation at the request of [name].  His PCP is Tonia Ghent, MD.  The consultation is for the evaluation of PD and to manage his DBS.  He is accompanied by his wife who supplements the history.  The first symptom(s) the patient noticed was right hand tremor in 1999.  He was seen by neurology and was dx with PD.  He was placed on something, but it caused sleepiness.  He does not think that he has ever been on levodopa.   He was placed on Mirapex, which seemed to help.  He only takes it twice per day.  He didn't think it made a difference when he took it tid.  He began to have tremor and it was suggested he do DBS.    The pt is s/p stn DBS in 2005.  He had a battery change in 2009.    10/02/12 update: The patient is accompanied today by his daughter, who supplements the history.  I reviewed medical records available to me since last visit.  The patient had his generator changed on 09/05/2012.  He remains on pramipexole, 0.5 mg twice per day.  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 [name] in that regard.  He is not compliant with CPAP so says that he doesn't want to return to her for f/u. He doesn't take the klonopin faithfully.  10/11/12 update:  Pt was seen as a walk in/work in today.  Pt had increasing tremor, L greater than R for 3 days.  Thinks that it started with the d/c of artane.  Speech stable.  11/19/12 update:  Pt is seen today for his PD, accompanied by his daughter who supplements the hx.    He is currently on klonopin 0.5 mg - 1/2-1 tablet q hs.  He only takes it when his wife is not working.  She works nights and is only in 2 days per week.  He has some reluctance to take it other nights.  He is on pramipexole 0.5 mg bid.  Last visit, his DBS was reset more similar to the settings he had prior to coming here, just with an increased voltage.  About 2 weeks after our last  visit, the patient decided to go back on the Artane.  The combination of the Artane and the DBS changes helped significantly.  He does ask if I can slightly increased voltage on the left hand, as he has some tremor at night that is bothersome.  Otherwise, he is doing well.  He feels that his balance has been great.  02/19/13 update:  This patient is accompanied in the office by his child who supplements the history.  The pt has a hx of PD.  He has been tremor free and is very happy about that.  He has had some increased balance loss; the last fall was a few months ago but he hasn't gotten hurt.  Was considering hernia surgery.  The records that were made available to me were reviewed.  He is holding on that for now but plans to have it done before end of year.  No hallucinations.  He is still having some acting out of the dreams, despite clonazepam.  06/19/13 update:  Patient is returning to followup regarding his Parkinson's disease.  I had the opportunity to review records since last visit.  He went to the emergency room on  05/12/2013 with chest pain and shortness of breath.  He had been fishing and had missed several doses of Lasix.  He had been eating seafood.  He ended up with an acute exacerbation of his chronic congestive heart failure.  Once he was diuresed and back on his medications, he is feeling better.  He just had a nuclear medicine study done on 06/17/2013 and the ejection fraction on this looked better than on his echocardiogram.  The left ventricular ejection fraction was 41%.  Prior to that, there was some concern that his left ventricular ejection fraction had dropped so much that he may need an ICD.  In terms of Parkinson's disease, the patient states that he has been doing very well in terms of tremor, but his walking really has deteriorated.  He describes a festinating gait, with much more shuffling.  He fell walking over his dog gate but otherwise has not had falls.  He went fishing  yesterday and states that he "stumbled all day long."  No hallucinations.  He has been trying to be more faithful with his CPAP. He remains on Mirapex 0.5 mg twice a day, Artane 2 mg twice a day.  10/23/13 update:  The patient returns today to the clinic, accompanied by his wife who supplements the history.  From a Parkinson's standpoint, the patient states that he has been doing very well.  No tremor.  He started on levodopa last visit and states that he has had no falls and overall stumbling has been much better.  He states that the only time that he stumbles is if he is inside of his fishing boat, and states that that is really because it is small and unstable.  He is taking his levodopa in the morning, after lunch and bedtime.  He has had no hallucinations.  His wife states that he is still draining and acting out the dreams and even fell out of bed a few days ago.  However, he is taking his clonazepam about 10 PM but doesn't go to bed until 1 AM.  He is not using his CPAP.  Is having significant constipation.   I reviewed his cardiology records since last visit.  He did have a heart catheterization and is scheduled to have an ICD placed on November 5.  02/23/14 update:  Pt returns for follow up accompanied by his wife who supplements the history.  The records that were made available to me were reviewed since last visit.  He had an ICD placed on 11/15/13.  He is doing well.  Taking carbidopa/levodopa 25/100 three times a day and remains on artane as well as mirapex 0.5 mg bid.  Had a fall up the stairs.  Wife describes festinating gate and pt thinks that levodopa contributes.  He also doesn't feel good much of the time and thinks that is from levodopa.  Admits, however, that when BS under good control, he feels good.  Has started seeing endocrinology and started on new meds.  He has klonopin for RBD.  Still vivid dreams but no falling out bed (in recliner now).  Having some word finding trouble and memory  doesn't seem clear.  Also, put back tailgate down on car and came down on generator and wants me to look at that.    07/01/14 update:  The patient follows up today regarding his Parkinson's disease.  He is accompanied by his wife who supplements the history.  Last visit, the patient was complaining about memory change which  I thought was likely multifactorial and due to medication such as Artane, Mirapex and Ultram, which he was taking every 3 hours.  The patient, however, thought that it was from levodopa and so we held it and the patient reported that he felt better and therefore continued to stay off of the medication.  He reports that falls are better after d/c levodopa but wife reports still falls.  Pt states that he only fell in shower after he d/c levodopa.  His wife does admit that she thinks that levodopa was causing loss of balance.  He is having more tremor over the last month on the L hand.   He remains on clonazepam 0.5 mg, 1-1/2 tablets at night for REM behavior disorder.  I reviewed records since last visit.  He has seen Dr. Dwyane Dee multiple times in regards to his uncontrolled diabetes.  He started on insulin on May 22 2014.  He states that he is doing well with injecting himself.  Having a lot of constipation.  Has the rancho recipe.    11/03/14 update:  The patient is following up today, accompanied by his wife who supplements the history.  Records were reviewed since our last visit.  The patient is on pramipexole 0.5 mg twice a day and Artane 2 mg twice a day.  He has not had any hallucinations.  Had a fall the other day walking up stairs carrying something and fell backward.  Hit his back.  No LOC.  Also fell backward in the summer around the pool.  [place] one time out of his boat.  Doesn't want to use a walker. He was in the emergency room recently with a rash that was felt secondary to insect bites that he got in the woods.  He also recently had a colonoscopy.  His diabetes is under better  control and his last A1c was 7.2.  States that he now has an insulin pump.  He remains on clonazepam 0.5 mg, 1-1/2 tablets at night for REM behavior disorder.  More back pain lately and will start injections for the back which have helped previously  02/04/15 update:  The patient presents today, accompanied by his wife who supplements the history.  I have reviewed prior records made available to me.  He has established a new primary care doctor in Dr. Damita Dunnings.  The patient remains on Artane, 2 mg twice a day as well as pramipexole 0.5 mg twice a day.  Overall, he has been doing fairly well.  He occasionally still has some tremor.  He had 2 falls since our last visit; he states that he was pushing a gate open and it came back and hit him.  He was also carrying wood the other day and fell forward with wood.  No hallucinations.  No lightheadedness or near syncope.  He remains on clonazepam 0.5 mg, 1-1/2 tablets at night for REM behavior disorder.  [facility]    06/08/15 update:  This patient is accompanied in the office by his spouse who supplements the history.  The patient remains on Artane, 2 mg twice a day as well as pramipexole 0.5 mg twice a day.  He tripped over the curb at [employer].  He tripped over the step on his deck.   He remains on clonazepam 0.5 mg, 1-1/2 tablets at night for REM behavior disorder.  He is still having trouble with that.  He fell out of bed the other night.  Wife asks about bed rails.   He felt that [facility] helped.  Is looking forward to getting in his pool.    09/09/15 update:  The patient follows up today, accompanied by his wife who supplements the history.  He is on pramipexole 0.5 g twice a day and trihexyphenidyl 2 mg twice a day.  Didn't go up to the 1.5 mg of pramipexole.   He is on clonazepam 0.5 mg, 1-1/2 tablets at night for REM behavior disorder.  His biggest issue is RLS at bedtime.  It is preventing him from going to sleep.    He denies any hallucinations.  He denies cognitive change.  He continues to struggle with balance and has fallen/stumbled a few times.  He has fallen in the boat.  Wife describes festinating gait.  Wife also describes crying frequently.  Denies depression. He has his battery changed on 06/10/15  12/18/15 update:  Pt is following up today, accompanied by his wife who supplements the history.  Last visit, I changed him from pramipexole, 0.5 mg twice per day to Mirapex ER, 1.5 mg once per day.  He states that it is very expensive.  He remains on trihexyphenidyl, 2 mg twice per day.  He is also on clonazepam 0.5 mg, 1-1/2 tablets at night for REM behavior disorder. It turns out he was taking this at 6pm.   I reviewed records since last visit.  He reports that his pharmacist to come off his Aldactone because of an interaction with his Parkinson's medicine.  I am unaware of any interaction with his current medications.  Been having issues with constipation, which has been treated by Dr. Para March.  [facility] in [facility].  When asked about falls, his wife states that he falls all the time but he is more conscious of festinating gait now that he is in PT.  Not gotten hurt with falls.    07/13/16 update:  Pt f/u today for PD, accompanied by his wife, who supplements the history.  The records that were made available to me were reviewed since last visit.  On pramipexole ER 1.5 mg daily and artane 2 mg bid.  Was on klonopin for RBD but he d/c the medication.  He thought that it caused insomnia.  He is still acting out the dreams.  He won't use his CPAP machine.  States that he is afraid of it malfunctioning.  Often will sleep in the recliner.  Having more back pain.  Having injections and 3rd is next week at [facility].    Wearing off:  No.  How long before next dose:  n/a Falls:   Yes.  , 12 months but some of these were falls out of bed in RBD N/V:  No. Hallucinations:  Yes.   (none visual but few auditory and has  gotten out of bed to see who was there)  visual distortions: No. Lightheaded:  No.  Syncope: No. Dyskinesia:  No.  PREVIOUS MEDICATIONS: Mirapex and artane  ALLERGIES:   Allergies  Allergen Reactions  . Penicillins Rash and Other (See Comments)    WEARS ALLERGY BRACELET Because of a history of documented adverse serious drug reaction;Medi Alert bracelet  is recommended Has patient had a PCN reaction causing immediate rash, facial/tongue/throat swelling, SOB or lightheadedness with hypotension: Yes Has patient had a PCN reaction causing severe rash involving mucus membranes or skin necrosis: unknown Has patient had a PCN reaction that required hospitalization NO Has patient had a PCN reaction  occurring within the last 10 years: NO If all of   . Watermelon [Citrullus Vulgaris]     Tickle in throat and cough    CURRENT MEDICATIONS:   Allergies as of 07/13/2016      Reactions   Penicillins Rash, Other (See Comments)   WEARS ALLERGY BRACELET Because of a history of documented adverse serious drug reaction;Medi Alert bracelet  is recommended Has patient had a PCN reaction causing immediate rash, facial/tongue/throat swelling, SOB or lightheadedness with hypotension: Yes Has patient had a PCN reaction causing severe rash involving mucus membranes or skin necrosis: unknown Has patient had a PCN reaction that required hospitalization NO Has patient had a PCN reaction occurring within the last 10 years: NO If all of    Watermelon [citrullus Vulgaris]    Tickle in throat and cough      Medication List       Accurate as of 07/13/16  3:32 PM. Always use your most recent med list.          acetaminophen 325 MG tablet Commonly known as:  TYLENOL Take 1-2 tablets (325-650 mg total) by mouth every 4 (four) hours as needed for mild pain.   albuterol 108 (90 Base) MCG/ACT inhaler Commonly known as:  PROVENTIL HFA;VENTOLIN HFA Inhale 2 puffs into the lungs every 6 (six) hours as needed  for wheezing or shortness of breath.   aspirin EC 81 MG tablet Take 81 mg by mouth daily.   Fluticasone-Salmeterol 250-50 MCG/DOSE Aepb Commonly known as:  ADVAIR Inhale 1 puff into the lungs 2 (two) times daily.   furosemide 80 MG tablet Commonly known as:  LASIX TAKE 1 AND 1/2 TABLET BY MOUTH EVERY DAY   glucose blood test strip Commonly known as:  ONETOUCH VERIO Use as instructed to check blood sugar 2 times a day Dx code E11.49   HUMALOG 100 UNIT/ML injection Generic drug:  insulin lispro USE MAX 56 UNITS PER DAY   WITH V-GO PUMP   ibuprofen 200 MG tablet Commonly known as:  ADVIL,MOTRIN Take 800 mg by mouth every 6 (six) hours as needed (For pain.).   levothyroxine 125 MCG tablet Commonly known as:  SYNTHROID, LEVOTHROID TAKE 1 TABLET EVERY DAY DAILY BEFORE BREAKFAST   lisinopril 2.5 MG tablet Commonly known as:  PRINIVIL,ZESTRIL Take 1 tablet (2.5 mg total) by mouth daily.   metFORMIN 1000 MG tablet Commonly known as:  GLUCOPHAGE TAKE 1 TABLET (1,000 MG TOTAL) BY MOUTH 2 (TWO) TIMES DAILY WITH A MEAL.   metolazone 2.5 MG tablet Commonly known as:  ZAROXOLYN Take 1 tablet ONCE A WEEK ONLY; take 30 minutes before taking Lasix   metoprolol succinate 50 MG 24 hr tablet Commonly known as:  TOPROL-XL TAKE 1 TABLET BY MOUTH DAILY. TAKE WITH OR IMMEDIATELY FOLLOWING A MEAL.   multivitamin with minerals Tabs tablet Take 1 tablet by mouth daily.   nitroGLYCERIN 0.4 MG SL tablet Commonly known as:  NITROSTAT Place 1 tablet (0.4 mg total) under the tongue every 5 (five) minutes as needed for chest pain.   NOVOTWIST 32G X 5 MM Misc Generic drug:  Insulin Pen Needle USE ONE PER DAY TO INJECT VICTOZA   ONETOUCH DELICA LANCETS 18H Misc Use to check blood sugar 2 times per day dx code E11.49   potassium chloride SA 20 MEQ tablet Commonly known as:  KLOR-CON M10 Take 2 tablets (40 mEq total) by mouth daily.   Pramipexole Dihydrochloride 1.5 MG Tb24 TAKE 1 TABLET  DAILY  rosuvastatin 20 MG tablet Commonly known as:  CRESTOR Take 1 tablet (20 mg total) by mouth daily.   traMADol 50 MG tablet Commonly known as:  ULTRAM TAKE 1 TABLET BY MOUTH TWICE A DAY AS NEEDED   travoprost (benzalkonium) 0.004 % ophthalmic solution Commonly known as:  TRAVATAN Place 1 drop into the right eye at bedtime.   trihexyphenidyl 2 MG tablet Commonly known as:  ARTANE TAKE 1 TABLET TWICE A DAY   V-GO 20 Kit USE ONE PER DAY   VICTOZA 18 MG/3ML Sopn Generic drug:  liraglutide INJECT 1.'8MG'$  ONCE DAILY   vitamin B-12 1000 MCG tablet Commonly known as:  CYANOCOBALAMIN Take 1,000 mcg by mouth daily.        PAST MEDICAL HISTORY:   Past Medical History:  Diagnosis Date  . AICD (automatic cardioverter/defibrillator) present    Dr Lovena Le office visit yearly  . Arthritis    cane  . Asthma   . Benign neoplasm of colon   . CAD (coronary artery disease)   . Cardiomyopathy   . Complication of anesthesia    pt states that he got a rash  . Constipation   . 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 DM (diabetes mellitus) ([place])    TYPE 2   . GERD (gastroesophageal reflux disease)   . Glaucoma    right eye  . HLD (hyperlipidemia)   . HTN (hypertension)    pt denies 08/19/12  . Hyperplasia, prostate   . Hyperthyroidism    thyroid lobectomy  . MI (myocardial infarction) [facility])    Dr Stanford Breed 2000, x3vessels bypass  . Nephrolithiasis   . OSA (obstructive sleep apnea)    AHI-28,on CPAP, noncompliant with CPAP  . Parkinson disease ([place])    1999  . PONV (postoperative nausea and vomiting)   . Restless legs   . Shortness of breath    Hx: of at all times  . Sleep apnea    does not use CPAP  . Sleep apnea, organic   . UTI (lower urinary tract infection) 09/15/12   Klebsiella  . Ventral hernia     PAST SURGICAL HISTORY:   Past Surgical History:  Procedure Laterality Date  . 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  CARDIAC CATHETERIZATION    . CATARACT EXTRACTION W/ INTRAOCULAR LENS IMPLANT     Hx: of right eye  . COLONOSCOPY N/A 10/13/2014   Procedure: COLONOSCOPY;  Surgeon: Jerene Bears, MD;  Location: [facility] ENDOSCOPY;  Service: Gastroenterology;  Laterality: N/A;  . COLONOSCOPY W/ BIOPSIES AND POLYPECTOMY     Hx: of  . CORONARY ARTERY BYPASS GRAFT  2000   [name], MD  . [place]  . DEEP BRAIN STIMULATOR PLACEMENT  2004   Right and left VIN stimulator placement (parkinsons)  . EYE SURGERY    . FINGER AMPUTATION     left pointer  . IMPLANTABLE CARDIOVERTER DEFIBRILLATOR IMPLANT N/A 11/13/2013   Procedure: IMPLANTABLE CARDIOVERTER DEFIBRILLATOR IMPLANT;  Surgeon: Evans Lance, MD;  Location: [facility] CATH LAB;  Service: Cardiovascular;  Laterality: N/A;  . INSERT / REPLACE / REMOVE PACEMAKER    . LEFT AND RIGHT HEART CATHETERIZATION WITH CORONARY ANGIOGRAM N/A 09/24/2013   Procedure: LEFT AND RIGHT HEART CATHETERIZATION WITH CORONARY ANGIOGRAM;  Surgeon: Burnell Blanks, MD;  Location: [facility] CATH LAB;  Service: Cardiovascular;  Laterality: N/A;  . LITHOTRIPSY     3 different times  .  MEDIAN STERNOTOMY  2000  . SUBTHALAMIC STIMULATOR BATTERY REPLACEMENT N/A 09/05/2012   Procedure: Deep brain stimulator battery change;  Surgeon: Maeola Harman, MD;  Location: [facility] NEURO ORS;  Service: Neurosurgery;  Laterality: N/A;  Deep brain stimulator battery change  . SUBTHALAMIC STIMULATOR BATTERY REPLACEMENT N/A 06/10/2015   Procedure: Deep Brain stimulator battery change;  Surgeon: Maeola Harman, MD;  Location: [facility] NEURO ORS;  Service: Neurosurgery;  Laterality: N/A;  . TONSILLECTOMY      SOCIAL HISTORY:   Social History   Social History  . Marital status: Married    Spouse name: [name]  . Number of children: 2  . Years of education: N/A   Occupational History  . 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 Smoking status: Never Smoker  . Smokeless  tobacco: Never Used  . Alcohol use Yes     Comment: occasional beer  . Drug use: No  . Sexual activity: Not on file   Other Topics Concern  . Not on file   Social History Narrative   From [facility]   Retired/disability Archivist   Likes to fish.     Married 1972   3 kids   [place] fan    FAMILY HISTORY:   Family Status  Relation Status  . Mother Deceased       complications of surgery  . Father Deceased       EtOHism  . Brother Alive       1 brother, 1 half brother  . Sister Alive       2 half sisters  . Child Alive       3, alive and well  . Unknown (Not Specified)  . MGM Deceased  . MGF Deceased  . PGM Deceased  . PGF Deceased  . Neg Hx (Not Specified)    ROS:  A complete 10 system review of systems was obtained and was unremarkable apart from what is mentioned above.  PHYSICAL EXAMINATION:    VITALS:   Vitals:   07/13/16 1524  BP: 126/70  Pulse: 78  SpO2: 93%  Weight: 258 lb (117 kg)  Height: 5\' 9"  (1.753 m)    GEN:  The patient appears stated age and is in NAD. HEENT:  Normocephalic, atraumatic.  The mucous membranes are moist. The superficial temporal arteries are without ropiness or tenderness. CV:  RRR Lungs:  CTAB Neck/HEME:  There are no carotid bruits bilaterally.   Neurological examination:  Orientation: The patient is alert and oriented x3.  Cranial nerves: There is good facial symmetry.  The visual fields are full to confrontational testing. The speech is fluent and just mildly dysarthric.  He is hypophonic. Soft palate rises symmetrically and there is no tongue deviation. Hearing is intact to conversational tone. Motor: Strength is 5/5 in the bilateral upper and lower extremities.   Shoulder shrug is equal and symmetric.  There is no pronator drift.  Movement examination: Tone: There is normal tone in the UE and LE bilaterally Abnormal movements: There is no tremor today Coordination:  There is slight decremation with  toe taps on the left Gait and Station: The patient has minimal difficulty arising out of a deep-seated chair without the use of the hands. The patient's stride length is decreased and he shuffles a little and starts to festinate.    LABS  Lab Results  Component Value Date   WBC 6.5 06/10/2015   HGB 12.5 (L) 06/10/2015  HCT 39.0 06/10/2015   MCV 85.5 06/10/2015   PLT 163 06/10/2015   Lab Results  Component Value Date   TSH 2.35 03/29/2016     Chemistry      Component Value Date/Time   NA 140 05/22/2016 1106   K 4.1 05/22/2016 1106   CL 98 05/22/2016 1106   CO2 29 05/22/2016 1106   BUN 23 05/22/2016 1106   CREATININE 1.53 (H) 05/22/2016 1106      Component Value Date/Time   CALCIUM 8.9 05/22/2016 1106   ALKPHOS 59 03/29/2016 1019   AST 10 03/29/2016 1019   ALT 14 03/29/2016 1019   BILITOT 0.3 03/29/2016 1019     Lab Results  Component Value Date   VITAMINB12 268 08/19/2012   Lab Results  Component Value Date   HGBA1C 7.9 07/04/2016   DBS programming was performed today, which is described in more detail on a separate programming procedural notes. No tremor after programming  ASSESSMENT/PLAN:  1.  Idiopathic Parkinson's disease.    -I again talked to the patient about the Artane, which he is taking 2 mg twice a day.  I expressed to him that this can cause loss of balance and confusion.  When we tried to stop it in the past, he was not able to tolerate tremor.  Regardless, he is having some early auditory hallucinations and noting some memory change and I told him we really need to try and drop this medication.  I encouraged him to at least try to go to just once per day and he was agreeable to try that.  -continue Mirapex ER 1.5 mg daily.  Told him that we may need to try and drop the dosage of this in the future for the same reason as the trihexyphenidyl, but for now we decided to continue him on that.  He does not think levodopa worked in the past and thinks it made  him "too loose" and I told him we may need to retry it in the future.  -His DBS battery was last changed on 09/05/2012 and on 06/10/15.    -Told him that he really needs to use his walker at all times for the festinating gait.  This is really the reason he is falling.  We have discussed this the last several visits. 2.  RBD  -I told him that he really needed to restart his clonazepam as he is falling out of bed.  If he does not want to do that, then he needs to add bed rails to his bed.  We talked about how to find those on [employer]. 3.  Pseudobulbar affect (crying)  -Talked to him about the nature and pathophysiology.  Really does not want any medication for this.  We'll limit me know if he changes his mind. 4.  OSAS, noncompliant with CPAP.  -Unfortunately, he has been unable to tolerate CPAP.  Talked about dental appliances, which he does not wish to try either. 5.  Sialorrhea  -This is associated with Parkinson's disease.  He may benefit from Myobloc in the future. 6.  Dysphagia.  -Overall, this is fairly mild but I will give an eye on this and consider a MBE if needed. 7.  CHF  -ICD placed on 11/15/2013 8.  Constipation  -copy of rancho recipe given although he still has the problem.  Dr. Para March managing.  Better with Miralax and dates. 9.  Uncontrolled DM  -Doing much better with insulin pump 10.  Follow-up in the  next few months, sooner should new neurologic issues arise.   Much greater than 50% of this visit was spent in counseling and coordinating care.  Total face to face time:  35 min which didn't include DBS time

## 2016-07-13 ENCOUNTER — Ambulatory Visit (INDEPENDENT_AMBULATORY_CARE_PROVIDER_SITE_OTHER): Payer: Medicare Other | Admitting: Neurology

## 2016-07-13 ENCOUNTER — Encounter: Payer: Self-pay | Admitting: Neurology

## 2016-07-13 VITALS — BP 126/70 | HR 78 | Ht 69.0 in | Wt 258.0 lb

## 2016-07-13 DIAGNOSIS — I255 Ischemic cardiomyopathy: Secondary | ICD-10-CM

## 2016-07-13 DIAGNOSIS — Z9689 Presence of other specified functional implants: Secondary | ICD-10-CM

## 2016-07-13 DIAGNOSIS — G4752 REM sleep behavior disorder: Secondary | ICD-10-CM

## 2016-07-13 DIAGNOSIS — G4733 Obstructive sleep apnea (adult) (pediatric): Secondary | ICD-10-CM

## 2016-07-13 DIAGNOSIS — G20 Parkinson's disease: Secondary | ICD-10-CM | POA: Diagnosis not present

## 2016-07-13 NOTE — Procedures (Signed)
DBS Programming was performed.    Total time spent programming was 20 minutes.  Device was confirmed to be on.  Soft start was confirmed to be on.  Impedences were checked and were within normal limits.  Battery was checked and was determined to be functionally normally and not near end of life.    Detailed analysis on separate neurophysiologic worksheet.    Final settings were as follows:  Left brain electrode:     1-2+           ; Amplitude  4.0   V   ; Pulse width 90 microseconds;   Frequency   160   Hz.  Right brain electrode:     4-7+          ; Amplitude   4.3  V ;  Pulse width 90  microseconds;  Frequency   160    Hz.

## 2016-07-13 NOTE — Patient Instructions (Addendum)
Try to decrease Trihexyphenidyl to one tablet daily.   Follow up 5 months.

## 2016-07-19 DIAGNOSIS — M545 Low back pain: Secondary | ICD-10-CM | POA: Diagnosis not present

## 2016-07-19 DIAGNOSIS — M48061 Spinal stenosis, lumbar region without neurogenic claudication: Secondary | ICD-10-CM | POA: Diagnosis not present

## 2016-07-19 DIAGNOSIS — M47817 Spondylosis without myelopathy or radiculopathy, lumbosacral region: Secondary | ICD-10-CM | POA: Diagnosis not present

## 2016-07-25 ENCOUNTER — Ambulatory Visit (INDEPENDENT_AMBULATORY_CARE_PROVIDER_SITE_OTHER): Payer: Medicare Other | Admitting: Family Medicine

## 2016-07-25 ENCOUNTER — Encounter: Payer: Self-pay | Admitting: Family Medicine

## 2016-07-25 DIAGNOSIS — S30861A Insect bite (nonvenomous) of abdominal wall, initial encounter: Secondary | ICD-10-CM

## 2016-07-25 DIAGNOSIS — S80862A Insect bite (nonvenomous), left lower leg, initial encounter: Secondary | ICD-10-CM | POA: Diagnosis not present

## 2016-07-25 DIAGNOSIS — S80861A Insect bite (nonvenomous), right lower leg, initial encounter: Secondary | ICD-10-CM | POA: Diagnosis not present

## 2016-07-25 DIAGNOSIS — W57XXXA Bitten or stung by nonvenomous insect and other nonvenomous arthropods, initial encounter: Secondary | ICD-10-CM

## 2016-07-25 DIAGNOSIS — I255 Ischemic cardiomyopathy: Secondary | ICD-10-CM | POA: Diagnosis not present

## 2016-07-25 MED ORDER — BETAMETHASONE DIPROPIONATE 0.05 % EX CREA: TOPICAL_CREAM | Freq: Two times a day (BID) | CUTANEOUS | 1 refills | Status: DC | PRN

## 2016-07-25 NOTE — Progress Notes (Signed)
Itchy B foot rash, also on legs and trunk but not on the hands.  Was not barefooted but had been outside.  He wasn't using beg spray.  No fevers.  Not painful.  Had old rx for betamethasone, that helped some but he ran out.    Meds, vitals, and allergies reviewed.   ROS: Per HPI unless specifically indicated in ROS section   nad ncat Diffuse distribution of isolated lesions noted on the feet legs and trunk. Not dermatomal. Is bilateral. Not linear. No blistering. No ulceration. Small blanching reddish irritated lesions a few millimeters across.

## 2016-07-25 NOTE — Patient Instructions (Signed)
Use the cream as needed and update me if you have more troubles.  Take care.  Glad to see you.

## 2016-07-26 ENCOUNTER — Ambulatory Visit (INDEPENDENT_AMBULATORY_CARE_PROVIDER_SITE_OTHER): Payer: Medicare Other | Admitting: *Deleted

## 2016-07-26 DIAGNOSIS — I255 Ischemic cardiomyopathy: Secondary | ICD-10-CM

## 2016-07-26 LAB — CUP PACEART REMOTE DEVICE CHECK
Battery Remaining Longevity: 107 mo
Battery Voltage: 3 V
Brady Statistic RA Percent Paced: 1.27 %
Date Time Interrogation Session: 20180718084224
HighPow Impedance: 75 Ohm
Implantable Lead Location: 753860
Implantable Lead Model: 5076
Implantable Pulse Generator Implant Date: 20151105
Lead Channel Impedance Value: 494 Ohm
Lead Channel Pacing Threshold Amplitude: 1 V
Lead Channel Sensing Intrinsic Amplitude: 19.375 mV
Lead Channel Sensing Intrinsic Amplitude: 2.5 mV
Lead Channel Sensing Intrinsic Amplitude: 2.5 mV
Lead Channel Setting Pacing Amplitude: 2.5 V
Lead Channel Setting Pacing Pulse Width: 0.4 ms
MDC IDC LEAD IMPLANT DT: 20151105
MDC IDC LEAD IMPLANT DT: 20151105
MDC IDC LEAD LOCATION: 753859
MDC IDC MSMT LEADCHNL RA IMPEDANCE VALUE: 456 Ohm
MDC IDC MSMT LEADCHNL RA PACING THRESHOLD PULSEWIDTH: 0.4 ms
MDC IDC MSMT LEADCHNL RV IMPEDANCE VALUE: 399 Ohm
MDC IDC MSMT LEADCHNL RV PACING THRESHOLD AMPLITUDE: 0.5 V
MDC IDC MSMT LEADCHNL RV PACING THRESHOLD PULSEWIDTH: 0.4 ms
MDC IDC MSMT LEADCHNL RV SENSING INTR AMPL: 19.375 mV
MDC IDC SET LEADCHNL RA PACING AMPLITUDE: 2 V
MDC IDC SET LEADCHNL RV SENSING SENSITIVITY: 0.3 mV
MDC IDC STAT BRADY AP VP PERCENT: 0.01 %
MDC IDC STAT BRADY AP VS PERCENT: 1.27 %
MDC IDC STAT BRADY AS VP PERCENT: 0.05 %
MDC IDC STAT BRADY AS VS PERCENT: 98.68 %
MDC IDC STAT BRADY RV PERCENT PACED: 0.05 %

## 2016-07-26 NOTE — Progress Notes (Signed)
Remote ICD transmission.   

## 2016-07-26 NOTE — Assessment & Plan Note (Signed)
Resumed chigger bite. Doesn't appear infected. Discussed with patient about options. Use betamethasone as needed for itching. Should heal. Update [place] as needed. He agrees.

## 2016-07-27 ENCOUNTER — Encounter: Payer: Self-pay | Admitting: Cardiology

## 2016-07-31 ENCOUNTER — Encounter: Payer: Self-pay | Admitting: Endocrinology

## 2016-07-31 ENCOUNTER — Telehealth: Payer: Self-pay | Admitting: Endocrinology

## 2016-07-31 NOTE — Telephone Encounter (Signed)
Patient stated that normally his blood sugars are 150 but lately it has pushed to 200. Call patient to advise and discuss VGO pump.

## 2016-08-01 ENCOUNTER — Telehealth: Payer: Self-pay | Admitting: Endocrinology

## 2016-08-01 NOTE — Telephone Encounter (Signed)
Called patient and left a voice message to call us back and please let us know if he is using the VGo 30 kit. Also if he is we need to change to the VGo 40 kit and we need to know which pharmacy that he wants us to send to. 

## 2016-08-01 NOTE — Telephone Encounter (Signed)
Called patient and left a voice message to let him know that Dr. Dwyane Dee responded on his my chart message and to please call Henry [name] back to let Henry [name] know if he is using the VGo 30 kit and what pharmacy to send in his new prescription for the VGo 40 kit.

## 2016-08-01 NOTE — Telephone Encounter (Signed)
Patient's wife called in reference to previous note. Not using the VGo 30 kit.   [facility]

## 2016-08-01 NOTE — Telephone Encounter (Signed)
Patient calling in reference to [facility] chart note. Please call and advise. OK to leave message if no answer.

## 2016-08-01 NOTE — Telephone Encounter (Signed)
Confirm that he is using the 30 unit pump, will need to change to 40 unit pump on the V-go

## 2016-08-04 ENCOUNTER — Telehealth: Payer: Self-pay

## 2016-08-04 NOTE — Telephone Encounter (Signed)
If his fasting blood sugars are still well over 200 he can apply 2 pumps at the same time until Monday or else we can call the prescription for the 30 unit pump

## 2016-08-04 NOTE — Telephone Encounter (Signed)
Called patient back he gave me his sugars, they have been running between 150-180, just now It was 133, earlier 174, yesterday 155, 157, 146, 178.  Patient advised that he has not seen 200 in a while, but would call to if they became high again and wanted to stay what he was on for the time being. Thank you!

## 2016-08-04 NOTE — Telephone Encounter (Signed)
Called patient who stated he was on the Vgo 20. Not sure how to proceed now with what RX to call in.  Thanks!

## 2016-08-04 NOTE — Telephone Encounter (Signed)
Called advised of MD note, patient sugars were sent to MD to look over.

## 2016-08-04 NOTE — Telephone Encounter (Signed)
Called patient who stated he was on the Vgo 20. Not sure how to proceed now with what RX to call in.  Sent to MD.

## 2016-08-06 ENCOUNTER — Other Ambulatory Visit: Payer: Self-pay | Admitting: Neurology

## 2016-08-06 ENCOUNTER — Other Ambulatory Visit: Payer: Self-pay | Admitting: Family Medicine

## 2016-08-08 DIAGNOSIS — M545 Low back pain: Secondary | ICD-10-CM | POA: Diagnosis not present

## 2016-08-08 DIAGNOSIS — M47817 Spondylosis without myelopathy or radiculopathy, lumbosacral region: Secondary | ICD-10-CM | POA: Diagnosis not present

## 2016-08-08 DIAGNOSIS — M48061 Spinal stenosis, lumbar region without neurogenic claudication: Secondary | ICD-10-CM | POA: Diagnosis not present

## 2016-08-09 ENCOUNTER — Other Ambulatory Visit: Payer: Self-pay | Admitting: Endocrinology

## 2016-08-09 DIAGNOSIS — H1851 Endothelial corneal dystrophy: Secondary | ICD-10-CM | POA: Diagnosis not present

## 2016-08-09 DIAGNOSIS — H4030X1 Glaucoma secondary to eye trauma, unspecified eye, mild stage: Secondary | ICD-10-CM | POA: Diagnosis not present

## 2016-08-09 DIAGNOSIS — H1013 Acute atopic conjunctivitis, bilateral: Secondary | ICD-10-CM | POA: Diagnosis not present

## 2016-08-09 DIAGNOSIS — H40032 Anatomical narrow angle, left eye: Secondary | ICD-10-CM | POA: Diagnosis not present

## 2016-08-16 ENCOUNTER — Ambulatory Visit (INDEPENDENT_AMBULATORY_CARE_PROVIDER_SITE_OTHER): Payer: Medicare Other | Admitting: Endocrinology

## 2016-08-16 ENCOUNTER — Encounter: Payer: Self-pay | Admitting: Endocrinology

## 2016-08-16 VITALS — BP 122/80 | HR 82 | Ht 69.0 in | Wt 257.8 lb

## 2016-08-16 DIAGNOSIS — E1165 Type 2 diabetes mellitus with hyperglycemia: Secondary | ICD-10-CM | POA: Diagnosis not present

## 2016-08-16 DIAGNOSIS — I255 Ischemic cardiomyopathy: Secondary | ICD-10-CM

## 2016-08-16 DIAGNOSIS — Z794 Long term (current) use of insulin: Secondary | ICD-10-CM

## 2016-08-16 NOTE — Patient Instructions (Addendum)
Take extra 1-2 clicks for snacks during night  Use 30 unit pump if am sugars stay over 343  Try 5 clicks at dinner time

## 2016-08-16 NOTE — Progress Notes (Signed)
Patient ID: Henry [name], male   DOB: 05-Feb-1950, 66 y.o.   MRN: [id number]           Reason for Appointment: Follow-up for Type 2 Diabetes   History of Present Illness:          Diagnosis: Type 2 diabetes mellitus, date of diagnosis: 2000      Past history:   Patient thinks he has been taking Amaryl for several years and probably metformin since onset also At some point he was changed from metformin to [facility] and was taking this since at least 2012 A1c had been higher in 2015 Since his A1c had been progressively higher with his regimen of Janumet and Amaryl he was started on Victoza in 01/2014 He was started on Lantus insulin in 5/16 because of persistent hyperglycemia especially fasting; was having readings as high as 293 Because of tendency to high postprandial readings and for control with Lantus he was switched to the V-go pump in  09/2014  Recent history:   INSULIN regimen: V-go-20 pump and boluses 4-10 units  Non-insulin hypoglycemic drugs the patient is taking are: Metformin 1 g twice a day, Victoza 1.8 mg daily in am   His last A1c was 7.9, previously 6.9  Current management, blood sugar patterns and problems identified:  He had higher sugars after steroid injections in 5/15 and his blood sugars were much higher for sometime after this  Although he was told to start the 30 unit basal on his pump he did this only with the sample then did not continue this  Also has been offered prescription for a higher dose of basal but he has not wanted to get this.  Now concerned about the cost of the V-go pump  His wife says that he is having high sugars in the morning because of eating snacks through the night, this may be fruit, peanut butter crackers or chicken  He does not take any boluses first student night  FASTING readings are mostly high and averaging near 200 although couple of mornings have been near normal  His blood sugars after evening meal are quite variable  but checking mostly around bedtim  Weight is about the same  He has seen the dietitian in 3/16 Not able to exercise because of back pain and musculoskeletal issues and balance       Side effects from medications have been: None Compliance with the medical regimen: Fair  Hypoglycemia:   none  Glucose monitoring:  done 1-2 times  a day         Glucometer: One Touch ultra 2 .      Blood Glucose readings by   Mean values apply above for all meters except median for One Touch  PRE-MEAL Fasting Lunch Dinner Bedtime Overall  Glucose range:  70-3 06    146-337    Mean/median: 198  190  212  178  190+/-65    POST-MEAL PC Breakfast PC Lunch PC Dinner  Glucose range:     Mean/median:  153       Self-care: The diet that the patient has been following is: None, some fried food and cookies, mostly sugar-free         Meals: 9 am , 2-3 pm and 7 pm     Exercise: unable to do any         Dietician visit, most recent: 03/2014                Weight  history:   Wt Readings from Last 3 Encounters:  08/16/16 257 lb 12.8 oz (116.9 kg)  07/25/16 258 lb (117 kg)  07/13/16 258 lb (117 kg)    Glycemic control:   Lab Results  Component Value Date   HGBA1C 7.9 07/04/2016   HGBA1C 6.9 (H) 03/29/2016   HGBA1C 8.0 (H) 12/27/2015   Lab Results  Component Value Date   MICROALBUR 1.1 12/27/2015   LDLCALC 118 (H) 09/16/2015   CREATININE 1.53 (H) 05/22/2016   Other active problems: See review of systems  No visits with results within 1 Week(s) from this visit.  Latest known visit with results is:  Clinical Support on 07/26/2016  Component Date Value Ref Range Status  . Date Time Interrogation Session 07/26/2016 [id number]   Final  . Pulse Generator Manufacturer 07/26/2016 MERM   Final  . Pulse Gen Model 07/26/2016 [id number] [facility] XT DR   Final  . Pulse Gen Serial Number 07/26/2016 [id number]   Final  . Clinic Name 07/26/2016 [place]   Final  . Implantable Pulse Generator  Type 07/26/2016 Implantable Cardiac Defibulator   Final  . Implantable Pulse Generator Implan* 07/26/2016 [id number]   Final  . Implantable Lead Manufacturer 07/26/2016 MERM   Final  . Implantable Lead Model 07/26/2016 [id number] CapSureFix Novus   Final  . Implantable Lead Serial Number 07/26/2016 [id number]   Final  . Implantable Lead Implant Date 07/26/2016 [id number]   Final  . Implantable Lead Location Detail 1 07/26/2016 APPENDAGE   Final  . Implantable Lead Location 07/26/2016 [id number]   Final  . Implantable Lead Manufacturer 07/26/2016 MERM   Final  . Implantable Lead Model 07/26/2016 [id number] Sprint Quattro Secure S   Final  . Implantable Lead Serial Number 07/26/2016 [id number]   Final  . Implantable Lead Implant Date 07/26/2016 [id number]   Final  . Implantable Lead Location Detail 1 07/26/2016 APEX   Final  . Implantable Lead Location 07/26/2016 [id number]   Final  . Lead Channel Setting Sensing Sensi* 07/26/2016 0.3  mV Final  . Lead Channel Setting Pacing Amplit* 07/26/2016 2  V Final  . Lead Channel Setting Pacing Pulse * 07/26/2016 0.4  ms Final  . Lead Channel Setting Pacing Amplit* 07/26/2016 2.5  V Final  . Lead Channel Impedance Value 07/26/2016 456  ohm Final  . Lead Channel Sensing Intrinsic Amp* 07/26/2016 2.5  mV Final  . Lead Channel Sensing Intrinsic Amp* 07/26/2016 2.5  mV Final  . Lead Channel Pacing Threshold Ampl* 07/26/2016 1  V Final  . Lead Channel Pacing Threshold Puls* 07/26/2016 0.4  ms Final  . Lead Channel Impedance Value 07/26/2016 494  ohm Final  . Lead Channel Impedance Value 07/26/2016 399  ohm Final  . Lead Channel Sensing Intrinsic Amp* 07/26/2016 19.375  mV Final  . Lead Channel Sensing Intrinsic Amp* 07/26/2016 19.375  mV Final  . Lead Channel Pacing Threshold Ampl* 07/26/2016 0.5  V Final  . Lead Channel Pacing Threshold Puls* 07/26/2016 0.4  ms Final  . HighPow Impedance 07/26/2016 75  ohm Final  . Battery Status 07/26/2016 OK   Final  . Battery Remaining  Longevity 07/26/2016 107  mo Final  . Battery Voltage 07/26/2016 3  V Final  . Brady Statistic RA Percent Paced 07/26/2016 1.27  % Final  . Brady Statistic RV Percent Paced 07/26/2016 0.05  % Final  . Brady Statistic AP VP Percent 07/26/2016 0.01  % Final  . Brady Statistic AS VP Percent 07/26/2016 0.05  %  Final  Loletha Grayer Statistic AP VS Percent 07/26/2016 1.27  % Final  . Loletha Grayer Statistic AS VS Percent 07/26/2016 98.68  % Final  . Eval Rhythm 07/26/2016 SR w/ PAC   Final      Allergies as of 08/16/2016      Reactions   Penicillins Rash, Other (See Comments)   WEARS ALLERGY BRACELET Because of a history of documented adverse serious drug reaction;Medi Alert bracelet  is recommended Has patient had a PCN reaction causing immediate rash, facial/tongue/throat swelling, SOB or lightheadedness with hypotension: Yes Has patient had a PCN reaction causing severe rash involving mucus membranes or skin necrosis: unknown Has patient had a PCN reaction that required hospitalization NO Has patient had a PCN reaction occurring within the last 10 years: NO If all of    Watermelon [citrullus Vulgaris]    Tickle in throat and cough      Medication List       Accurate as of 08/16/16  8:40 PM. Always use your most recent med list.          acetaminophen 325 MG tablet Commonly known as:  TYLENOL Take 1-2 tablets (325-650 mg total) by mouth every 4 (four) hours as needed for mild pain.   albuterol 108 (90 Base) MCG/ACT inhaler Commonly known as:  PROVENTIL HFA;VENTOLIN HFA Inhale 2 puffs into the lungs every 6 (six) hours as needed for wheezing or shortness of breath.   aspirin EC 81 MG tablet Take 81 mg by mouth daily.   betamethasone dipropionate 0.05 % cream Commonly known as:  DIPROLENE Apply topically 2 (two) times daily as needed.   clonazePAM 0.5 MG tablet Commonly known as:  KLONOPIN Take 0.5 mg by mouth at bedtime.   Fluticasone-Salmeterol 250-50 MCG/DOSE Aepb Commonly known as:   ADVAIR Inhale 1 puff into the lungs 2 (two) times daily.   furosemide 80 MG tablet Commonly known as:  LASIX TAKE 1 AND 1/2 TABLET BY MOUTH EVERY DAY   glucose blood test strip Commonly known as:  ONETOUCH VERIO Use as instructed to check blood sugar 2 times a day Dx code E11.49   HUMALOG 100 UNIT/ML injection Generic drug:  insulin lispro USE MAX 56 UNITS PER DAY   WITH V-GO PUMP   ibuprofen 200 MG tablet Commonly known as:  ADVIL,MOTRIN Take 800 mg by mouth every 6 (six) hours as needed (For pain.).   levothyroxine 125 MCG tablet Commonly known as:  SYNTHROID, LEVOTHROID TAKE 1 TABLET EVERY DAY DAILY BEFORE BREAKFAST   lisinopril 2.5 MG tablet Commonly known as:  PRINIVIL,ZESTRIL Take 1 tablet (2.5 mg total) by mouth daily.   metFORMIN 1000 MG tablet Commonly known as:  GLUCOPHAGE TAKE 1 TABLET (1,000 MG TOTAL) BY MOUTH 2 (TWO) TIMES DAILY WITH A MEAL.   metolazone 2.5 MG tablet Commonly known as:  ZAROXOLYN Take 1 tablet ONCE A WEEK ONLY; take 30 minutes before taking Lasix   metoprolol succinate 50 MG 24 hr tablet Commonly known as:  TOPROL-XL TAKE 1 TABLET BY MOUTH DAILY. TAKE WITH OR IMMEDIATELY FOLLOWING A MEAL.   multivitamin with minerals Tabs tablet Take 1 tablet by mouth daily.   nitroGLYCERIN 0.4 MG SL tablet Commonly known as:  NITROSTAT Place 1 tablet (0.4 mg total) under the tongue every 5 (five) minutes as needed for chest pain.   NOVOTWIST 32G X 5 MM Misc Generic drug:  Insulin Pen Needle USE ONE PER DAY TO INJECT VICTOZA   ONETOUCH DELICA LANCETS 16W Misc Use  to check blood sugar 2 times per day dx code E11.49   potassium chloride SA 20 MEQ tablet Commonly known as:  KLOR-CON M10 Take 2 tablets (40 mEq total) by mouth daily.   Pramipexole Dihydrochloride 1.5 MG Tb24 TAKE 1 TABLET DAILY   rosuvastatin 20 MG tablet Commonly known as:  CRESTOR Take 1 tablet (20 mg total) by mouth daily.   traMADol 50 MG tablet Commonly known as:   ULTRAM TAKE 1 TABLET BY MOUTH TWICE A DAY AS NEEDED   travoprost (benzalkonium) 0.004 % ophthalmic solution Commonly known as:  TRAVATAN Place 1 drop into the right eye at bedtime.   trihexyphenidyl 2 MG tablet Commonly known as:  ARTANE Take 1 tablet (2 mg total) by mouth daily.   V-GO 20 Kit USE ONE PER DAY   VICTOZA 18 MG/3ML Sopn Generic drug:  liraglutide INJECT 1.'8MG'$  ONCE DAILY   vitamin B-12 1000 MCG tablet Commonly known as:  CYANOCOBALAMIN Take 1,000 mcg by mouth daily.       Allergies:  Allergies  Allergen Reactions  . Penicillins Rash and Other (See Comments)    WEARS ALLERGY BRACELET Because of a history of documented adverse serious drug reaction;Medi Alert bracelet  is recommended Has patient had a PCN reaction causing immediate rash, facial/tongue/throat swelling, SOB or lightheadedness with hypotension: Yes Has patient had a PCN reaction causing severe rash involving mucus membranes or skin necrosis: unknown Has patient had a PCN reaction that required hospitalization NO Has patient had a PCN reaction occurring within the last 10 years: NO If all of   . Watermelon [Citrullus Vulgaris]     Tickle in throat and cough    Past Medical History:  Diagnosis Date  . AICD (automatic cardioverter/defibrillator) present    Dr Lovena Le office visit yearly  . Arthritis    cane  . Asthma   . Benign neoplasm of colon   . CAD (coronary artery disease)   . Cardiomyopathy   . Complication of anesthesia    pt states that he got a rash  . Constipation   . 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 DM (diabetes mellitus) ([place])    TYPE 2   . GERD (gastroesophageal reflux disease)   . Glaucoma    right eye  . HLD (hyperlipidemia)   . HTN (hypertension)    pt denies 08/19/12  . Hyperplasia, prostate   . Hyperthyroidism    thyroid lobectomy  . MI (myocardial infarction) [facility])    Dr Stanford Breed 2000, x3vessels bypass  . Nephrolithiasis   . OSA  (obstructive sleep apnea)    AHI-28,on CPAP, noncompliant with CPAP  . Parkinson disease ([place])    1999  . PONV (postoperative nausea and vomiting)   . Restless legs   . Shortness of breath    Hx: of at all times  . Sleep apnea    does not use CPAP  . Sleep apnea, organic   . UTI (lower urinary tract infection) 09/15/12   Klebsiella  . Ventral hernia     Past Surgical History:  Procedure Laterality Date  . 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 CARDIAC CATHETERIZATION    . CATARACT EXTRACTION W/ INTRAOCULAR LENS IMPLANT     Hx: of right eye  . COLONOSCOPY N/A 10/13/2014   Procedure: COLONOSCOPY;  Surgeon: Jerene Bears, MD;  Location: [facility] ENDOSCOPY;  Service: Gastroenterology;  Laterality: N/A;  . COLONOSCOPY W/ BIOPSIES AND  POLYPECTOMY     Hx: of  . CORONARY ARTERY BYPASS GRAFT  2000   [name], MD  . [place]  . DEEP BRAIN STIMULATOR PLACEMENT  2004   Right and left VIN stimulator placement (parkinsons)  . EYE SURGERY    . FINGER AMPUTATION     left pointer  . IMPLANTABLE CARDIOVERTER DEFIBRILLATOR IMPLANT N/A 11/13/2013   Procedure: IMPLANTABLE CARDIOVERTER DEFIBRILLATOR IMPLANT;  Surgeon: Evans Lance, MD;  Location: [facility] CATH LAB;  Service: Cardiovascular;  Laterality: N/A;  . INSERT / REPLACE / REMOVE PACEMAKER    . LEFT AND RIGHT HEART CATHETERIZATION WITH CORONARY ANGIOGRAM N/A 09/24/2013   Procedure: LEFT AND RIGHT HEART CATHETERIZATION WITH CORONARY ANGIOGRAM;  Surgeon: Burnell Blanks, MD;  Location: [facility] C[facility] LAB;  Service: Cardiovascular;  Laterality: N/A;  . LITHOTRIPSY     3 different times  . MEDIAN STERNOTOMY  2000  . SUBTHALAMIC STIMULATOR BATTERY REPLACEMENT N/A 09/05/2012   Procedure: Deep brain stimulator battery change;  Surgeon: Erline Levine, MD;  Location: [place] NEURO ORS;  Service: Neurosurgery;  Laterality: N/A;  Deep brain stimulator battery change  . SUBTHALAMIC STIMULATOR BATTERY REPLACEMENT N/A 06/10/2015    Procedure: Deep Brain stimulator battery change;  Surgeon: Erline Levine, MD;  Location: [place] NEURO ORS;  Service: Neurosurgery;  Laterality: N/A;  . TONSILLECTOMY      Family History  Problem Relation Age of Onset  . Aneurysm Mother   . Alcoholism Father   . Autoimmune disease Brother 56       AIDS  . Peripheral vascular disease Unknown   . Arthritis Unknown   . Diabetes Neg Hx   . Heart disease Neg Hx   . Colon cancer Neg Hx   . Prostate cancer Neg Hx     Social History:  reports that he has never smoked. He has never used smokeless tobacco. He reports that he drinks alcohol. He reports that he does not use drugs.    Review of Systems     Most recent eye exam was done in 2/18    HYPERLIPIDEMIA: Needs follow-up, has not had any labs done through PCP. He is on Crestor       Lab Results  Component Value Date   CHOL 132 12/06/2015   CHOL 182 09/16/2015   CHOL 147 05/19/2014   Lab Results  Component Value Date   HDL 30.20 (L) 12/06/2015   HDL 31.30 (L) 09/16/2015   HDL 35.50 (L) 05/19/2014   Lab Results  Component Value Date   LDLCALC 118 (H) 09/16/2015   LDLCALC 126 (H) 04/08/2013   LDLCALC 51 02/28/2012   Lab Results  Component Value Date   TRIG 217.0 (H) 12/06/2015   TRIG 165.0 (H) 09/16/2015   TRIG 213.0 (H) 05/19/2014   Lab Results  Component Value Date   CHOLHDL 4 12/06/2015   CHOLHDL 6 09/16/2015   CHOLHDL 4 05/19/2014   Lab Results  Component Value Date   LDLDIRECT 69.0 12/06/2015   LDLDIRECT 86.0 05/19/2014   LDLDIRECT 88.7 08/06/2013                  Thyroid:   He has had hypothyroidism following radioactive iodine treatment for hyperthyroidism in 2013  TSH is usually normal with  dosage of 125 g   Lab Results  Component Value Date   TSH 2.35 03/29/2016   TSH 3.58 09/16/2015   TSH 2.06 12/15/2014   FREET4 0.82 08/20/2014   FREET4 0.98  11/20/2011   FREET4 0.91 10/16/2011       The blood pressure has been followed by  other  physicians Also on multiple diuretics from cardiologist  Renal function has been fluctuating  Lab Results  Component Value Date   CREATININE 1.53 (H) 05/22/2016   BUN 23 05/22/2016   NA 140 05/22/2016   K 4.1 05/22/2016   CL 98 05/22/2016   CO2 29 05/22/2016     Diabetic foot exam in 03/2016 showed  normal monofilament sensation in the toes except decreased on the right great toe and plantar surfaces, no skin lesions or ulcers on the feet and normal pedal pulses    Physical Examination:  BP 122/80   Pulse 82   Ht '5\' 9"'$  (1.753 m)   Wt 257 lb 12.8 oz (116.9 kg)   SpO2 90%   BMI 38.07 kg/m      ASSESSMENT/PLAN  Diabetes type 2, uncontrolled with obesity    See history of present illness for detailed discussion of his current management, blood sugar patterns and problems identified  His blood sugars again difficult to control and overall blood sugars are mostly high throughout the day Although he is trying to cover his meals with boluses he is probably not taking enough at times especially with snacks in between meals Not clear if his fasting readings are high because of overnight snacking or whether he needs a higher basal; has had occasional normal readings in the mornings also  Recommendations: For the next few days he will try to bolus 2-4 units for every snack he has overnight He will increase her suppertime bolus by one click Will give him a sample of 30 units basal again if he continues to have high fasting readings His wife is safe with the V-go company to help with insurance coverage issues   Patient Instructions  Take extra 1-2 clicks for snacks during night  Use 30 unit pump if am sugars stay over 626  Try 5 clicks at dinner time      [facility] 08/16/2016, 8:40 PM   Note: This office note was prepared with Dragon voice recognition system technology. Any transcriptional errors that result from this process are unintentional.

## 2016-08-24 ENCOUNTER — Encounter: Payer: Self-pay | Admitting: Endocrinology

## 2016-08-25 ENCOUNTER — Other Ambulatory Visit: Payer: Self-pay

## 2016-08-28 ENCOUNTER — Other Ambulatory Visit: Payer: Self-pay

## 2016-08-28 NOTE — Telephone Encounter (Signed)
Wife Archie Patten, calling to check on status of V-Go 30 script?  Patient only has 3 left and wants to know what to do if he runs out?   [phone] cell TY,  -LL

## 2016-08-28 NOTE — Telephone Encounter (Signed)
Called [facility] at [phone] and spoke with Pamala Hurry at the pharmacy to order the [place] 30 kit for patient. [phone] is where this script will be going per [facility]

## 2016-08-30 ENCOUNTER — Telehealth: Payer: Self-pay | Admitting: Endocrinology

## 2016-08-30 NOTE — Telephone Encounter (Signed)
This is an Museum/gallery exhibitions officer

## 2016-08-30 NOTE — Telephone Encounter (Signed)
Please see message.  Please advise. 

## 2016-08-30 NOTE — Telephone Encounter (Signed)
Please advise 

## 2016-08-30 NOTE — Telephone Encounter (Signed)
[employer] calling in reference to [facility] stating Dr. Dwyane Dee is not a medicare approved provider. [employer] provided patient's number to contact about this. Please call and advise.   If needed, Ref number [id number]

## 2016-09-04 ENCOUNTER — Other Ambulatory Visit: Payer: Self-pay | Admitting: Family Medicine

## 2016-09-04 ENCOUNTER — Other Ambulatory Visit: Payer: Self-pay | Admitting: Endocrinology

## 2016-09-05 ENCOUNTER — Telehealth: Payer: Self-pay | Admitting: Endocrinology

## 2016-09-05 ENCOUNTER — Other Ambulatory Visit: Payer: Self-pay

## 2016-09-05 MED ORDER — V-GO 30 KIT: PACK | 3 refills | Status: DC

## 2016-09-05 NOTE — Telephone Encounter (Signed)
Wife calling to inquire about V-go 30 through [facility]?  Please advise.  Ty,  -LL

## 2016-09-05 NOTE — Telephone Encounter (Signed)
You may give him samples, please check on the status of his prescription also

## 2016-09-05 NOTE — Telephone Encounter (Signed)
Spoke to the patient and he is going to come to the office tomorrow to pick up samples and I re-ordered the V-Go 30 for him to [facility] pharmacy

## 2016-09-05 NOTE — Telephone Encounter (Signed)
Ok to give samples please advise

## 2016-09-05 NOTE — Telephone Encounter (Signed)
Routing to you °

## 2016-09-05 NOTE — Telephone Encounter (Signed)
Patient needs samples for v-go 30 kit.  Please advise.  -LL

## 2016-09-06 NOTE — Telephone Encounter (Signed)
Patient was given a pack of 6 today

## 2016-09-07 ENCOUNTER — Other Ambulatory Visit: Payer: Self-pay | Admitting: Family Medicine

## 2016-09-08 ENCOUNTER — Telehealth: Payer: Self-pay

## 2016-09-08 NOTE — Telephone Encounter (Signed)
Called [facility] and requested they fax a new form for [place] to fill out

## 2016-09-18 ENCOUNTER — Encounter: Payer: Self-pay | Admitting: Family Medicine

## 2016-09-18 ENCOUNTER — Ambulatory Visit (INDEPENDENT_AMBULATORY_CARE_PROVIDER_SITE_OTHER): Payer: Medicare Other | Admitting: Family Medicine

## 2016-09-18 VITALS — BP 102/58 | HR 76 | Temp 98.5°F | Wt 258.5 lb

## 2016-09-18 DIAGNOSIS — I255 Ischemic cardiomyopathy: Secondary | ICD-10-CM

## 2016-09-18 DIAGNOSIS — R350 Frequency of micturition: Secondary | ICD-10-CM | POA: Diagnosis not present

## 2016-09-18 LAB — POC URINALSYSI DIPSTICK (AUTOMATED)
Bilirubin, UA: NEGATIVE
Blood, UA: NEGATIVE
Glucose, UA: NEGATIVE
KETONES UA: NEGATIVE
Leukocytes, UA: NEGATIVE
NITRITE UA: NEGATIVE
PH UA: 7.5 (ref 5.0–8.0)
PROTEIN UA: NEGATIVE
Spec Grav, UA: 1.02 (ref 1.010–1.025)
Urobilinogen, UA: 0.2 E.U./dL

## 2016-09-18 NOTE — Patient Instructions (Signed)
Don't change your meds for now.  If your sugar is consistently or frequently above 200, then update endocrinology.  If your sugar is better and you continue to have urinary symptoms then let me know.  Take care.  Glad to see you.

## 2016-09-18 NOTE — Progress Notes (Signed)
He has been acting out his dreams due to Parkinsons.  Discussed with patient and wife that this is common with Parkinson's disease. He is still using his cane at baseline.    Urinary frequency.  On lasix with weekly metolazone.  He has some burning with urination.  Sugar has been up to 200, d/w pt.  That likely could cause some of his urinary sx, d/w pt.  He has known renal stones but no gross hematuria.    He has been having more back pain.   The pain clearly gets better with sitting.  He was prev injected.  His gait is affected by parkinsons at baseline.    Prev imaging with: 9 mm nonobstructing stone at the lower pole of the left kidney. 3 cm benign-appearing cyst at the upper pole of the right kidney.  Meds, vitals, and allergies reviewed.   ROS: Per HPI unless specifically indicated in ROS section   GEN: nad, alert and oriented, speech is slow- at baseline, flat facial affect HEENT: mucous membranes moist NECK: supple w/o LA CV: rrr PULM: ctab, no inc wob ABD: soft, +bs EXT: no edema SKIN: no acute rash Back not ttp in midline or at CVA

## 2016-09-19 DIAGNOSIS — R350 Frequency of micturition: Secondary | ICD-10-CM | POA: Insufficient documentation

## 2016-09-19 LAB — URINE CULTURE
MICRO NUMBER: 80993511
SPECIMEN QUALITY: ADEQUATE

## 2016-09-19 NOTE — Assessment & Plan Note (Signed)
Could be related to his diuretic use, diabetes, or less likely urinary tract infection. He does not have acute mental status changes. He does have vivid dreams but this likely at baseline due to Parkinson's. Check urine culture today just to make sure he did not have another treatable source. Discussed with patient. He does have a known renal cyst and a known stone is likely noncontributory. His back pain looks to be related to spinal stenosis and not from pyelonephritis. Differential discussed with patient and wife. All agree with plan.

## 2016-10-01 ENCOUNTER — Encounter: Payer: Self-pay | Admitting: Endocrinology

## 2016-10-04 ENCOUNTER — Other Ambulatory Visit: Payer: Self-pay | Admitting: Endocrinology

## 2016-10-16 ENCOUNTER — Other Ambulatory Visit (INDEPENDENT_AMBULATORY_CARE_PROVIDER_SITE_OTHER): Payer: Medicare Other

## 2016-10-16 DIAGNOSIS — E1165 Type 2 diabetes mellitus with hyperglycemia: Secondary | ICD-10-CM

## 2016-10-16 DIAGNOSIS — E782 Mixed hyperlipidemia: Secondary | ICD-10-CM

## 2016-10-16 DIAGNOSIS — Z794 Long term (current) use of insulin: Secondary | ICD-10-CM

## 2016-10-16 LAB — COMPREHENSIVE METABOLIC PANEL
ALK PHOS: 53 U/L (ref 39–117)
ALT: 21 U/L (ref 0–53)
AST: 12 U/L (ref 0–37)
Albumin: 4.2 g/dL (ref 3.5–5.2)
BILIRUBIN TOTAL: 0.3 mg/dL (ref 0.2–1.2)
BUN: 35 mg/dL — AB (ref 6–23)
CO2: 35 meq/L — AB (ref 19–32)
CREATININE: 1.67 mg/dL — AB (ref 0.40–1.50)
Calcium: 9.4 mg/dL (ref 8.4–10.5)
Chloride: 91 mEq/L — ABNORMAL LOW (ref 96–112)
GFR: 44.07 mL/min — AB (ref >60.00)
GLUCOSE: 208 mg/dL — AB (ref 70–99)
Potassium: 3.9 mEq/L (ref 3.5–5.1)
SODIUM: 137 meq/L (ref 135–145)
TOTAL PROTEIN: 6.8 g/dL (ref 6.0–8.3)

## 2016-10-16 LAB — BASIC METABOLIC PANEL
BUN: 35 mg/dL — ABNORMAL HIGH (ref 6–23)
CALCIUM: 9.4 mg/dL (ref 8.4–10.5)
CO2: 35 mEq/L — ABNORMAL HIGH (ref 19–32)
Chloride: 91 mEq/L — ABNORMAL LOW (ref 96–112)
Creatinine, Ser: 1.67 mg/dL — ABNORMAL HIGH (ref 0.40–1.50)
GFR: 44.07 mL/min — AB (ref >60.00)
Glucose, Bld: 208 mg/dL — ABNORMAL HIGH (ref 70–99)
POTASSIUM: 3.9 meq/L (ref 3.5–5.1)
SODIUM: 137 meq/L (ref 135–145)

## 2016-10-16 LAB — LIPID PANEL
Cholesterol: 111 mg/dL (ref 0–200)
HDL: 27.8 mg/dL — AB (ref >39.00)
NONHDL: 83.16
Total CHOL/HDL Ratio: 4
Triglycerides: 287 mg/dL — ABNORMAL HIGH (ref 0.0–149.0)
VLDL: 57.4 mg/dL — ABNORMAL HIGH (ref 0.0–40.0)

## 2016-10-16 LAB — HEMOGLOBIN A1C: HEMOGLOBIN A1C: 7.9 % — AB (ref 4.6–6.5)

## 2016-10-16 LAB — LDL CHOLESTEROL, DIRECT: LDL DIRECT: 52 mg/dL

## 2016-10-19 ENCOUNTER — Ambulatory Visit: Payer: Medicare Other | Admitting: Endocrinology

## 2016-10-25 ENCOUNTER — Other Ambulatory Visit: Payer: Self-pay | Admitting: Neurology

## 2016-10-25 ENCOUNTER — Ambulatory Visit (INDEPENDENT_AMBULATORY_CARE_PROVIDER_SITE_OTHER): Payer: Medicare Other | Admitting: *Deleted

## 2016-10-25 DIAGNOSIS — I255 Ischemic cardiomyopathy: Secondary | ICD-10-CM | POA: Diagnosis not present

## 2016-10-25 DIAGNOSIS — M5126 Other intervertebral disc displacement, lumbar region: Secondary | ICD-10-CM | POA: Diagnosis not present

## 2016-10-25 DIAGNOSIS — M545 Low back pain: Secondary | ICD-10-CM | POA: Diagnosis not present

## 2016-10-25 DIAGNOSIS — M48061 Spinal stenosis, lumbar region without neurogenic claudication: Secondary | ICD-10-CM | POA: Diagnosis not present

## 2016-10-25 DIAGNOSIS — Z6838 Body mass index (BMI) 38.0-38.9, adult: Secondary | ICD-10-CM | POA: Diagnosis not present

## 2016-10-25 DIAGNOSIS — M5136 Other intervertebral disc degeneration, lumbar region: Secondary | ICD-10-CM | POA: Diagnosis not present

## 2016-10-25 DIAGNOSIS — G20 Parkinson's disease: Secondary | ICD-10-CM | POA: Diagnosis not present

## 2016-10-26 LAB — CUP PACEART REMOTE DEVICE CHECK
Battery Remaining Longevity: 103 mo
Battery Voltage: 2.99 V
Brady Statistic AP VS Percent: 0.42 %
Brady Statistic AS VS Percent: 99.53 %
Brady Statistic RV Percent Paced: 0.05 %
HighPow Impedance: 70 Ohm
Implantable Lead Implant Date: 20151105
Implantable Lead Implant Date: 20151105
Implantable Lead Location: 753860
Implantable Pulse Generator Implant Date: 20151105
Lead Channel Impedance Value: 437 Ohm
Lead Channel Pacing Threshold Amplitude: 1 V
Lead Channel Pacing Threshold Pulse Width: 0.4 ms
Lead Channel Pacing Threshold Pulse Width: 0.4 ms
Lead Channel Sensing Intrinsic Amplitude: 19 mV
Lead Channel Sensing Intrinsic Amplitude: 2.25 mV
Lead Channel Sensing Intrinsic Amplitude: 2.25 mV
Lead Channel Setting Sensing Sensitivity: 0.3 mV
MDC IDC LEAD LOCATION: 753859
MDC IDC MSMT LEADCHNL RV IMPEDANCE VALUE: 437 Ohm
MDC IDC MSMT LEADCHNL RV IMPEDANCE VALUE: 494 Ohm
MDC IDC MSMT LEADCHNL RV PACING THRESHOLD AMPLITUDE: 0.375 V
MDC IDC MSMT LEADCHNL RV SENSING INTR AMPL: 19 mV
MDC IDC SESS DTM: 20181017082204
MDC IDC SET LEADCHNL RA PACING AMPLITUDE: 2 V
MDC IDC SET LEADCHNL RV PACING AMPLITUDE: 2.5 V
MDC IDC SET LEADCHNL RV PACING PULSEWIDTH: 0.4 ms
MDC IDC STAT BRADY AP VP PERCENT: 0 %
MDC IDC STAT BRADY AS VP PERCENT: 0.05 %
MDC IDC STAT BRADY RA PERCENT PACED: 0.42 %

## 2016-10-26 NOTE — Progress Notes (Signed)
Remote ICD transmission.   

## 2016-10-27 ENCOUNTER — Encounter: Payer: Self-pay | Admitting: Cardiology

## 2016-10-27 ENCOUNTER — Ambulatory Visit (INDEPENDENT_AMBULATORY_CARE_PROVIDER_SITE_OTHER): Payer: Medicare Other

## 2016-10-27 DIAGNOSIS — Z23 Encounter for immunization: Secondary | ICD-10-CM | POA: Diagnosis not present

## 2016-10-31 ENCOUNTER — Other Ambulatory Visit: Payer: Self-pay | Admitting: Family Medicine

## 2016-10-31 NOTE — Telephone Encounter (Signed)
Received refill request electronically Last refill 06/15/16 #60/3 Last office visit 09/18/16

## 2016-11-01 NOTE — Telephone Encounter (Signed)
Please call in.  Thanks.   

## 2016-11-01 NOTE — Telephone Encounter (Signed)
Rx called to pharmacy as instructed. 

## 2016-11-07 ENCOUNTER — Ambulatory Visit (INDEPENDENT_AMBULATORY_CARE_PROVIDER_SITE_OTHER): Payer: Medicare Other | Admitting: Endocrinology

## 2016-11-07 ENCOUNTER — Encounter: Payer: Self-pay | Admitting: Endocrinology

## 2016-11-07 VITALS — BP 114/78 | HR 87 | Ht 69.0 in | Wt 260.2 lb

## 2016-11-07 DIAGNOSIS — Z794 Long term (current) use of insulin: Secondary | ICD-10-CM

## 2016-11-07 DIAGNOSIS — E890 Postprocedural hypothyroidism: Secondary | ICD-10-CM | POA: Diagnosis not present

## 2016-11-07 DIAGNOSIS — I255 Ischemic cardiomyopathy: Secondary | ICD-10-CM | POA: Diagnosis not present

## 2016-11-07 DIAGNOSIS — E782 Mixed hyperlipidemia: Secondary | ICD-10-CM

## 2016-11-07 DIAGNOSIS — E1165 Type 2 diabetes mellitus with hyperglycemia: Secondary | ICD-10-CM | POA: Diagnosis not present

## 2016-11-07 NOTE — Patient Instructions (Signed)
Take 1/2 Metformin in am and 1 at dinner  Take 3 clicj]ks at Breakfast not 2.

## 2016-11-07 NOTE — Progress Notes (Signed)
Patient ID: Henry [name], male   DOB: [birth year], 66 y.o.   MRN: [id number]           Reason for Appointment: Follow-up for Type 2 Diabetes   History of Present Illness:          Diagnosis: Type 2 diabetes mellitus, date of diagnosis: 2000      Past history:   Patient thinks he has been taking Amaryl for several years and probably metformin since onset also At some point he was changed from metformin to [facility] and was taking this since at least 2012 A1c had been higher in 2015 Since his A1c had been progressively higher with his regimen of Janumet and Amaryl he was started on Victoza in 01/2014 He was started on Lantus insulin in 5/16 because of persistent hyperglycemia especially fasting; was having readings as high as 293 Because of tendency to high postprandial readings and for control with Lantus he was switched to the V-go pump in  09/2014  Recent history:   INSULIN regimen: V-go-30 pump and boluses 4-8 units  Non-insulin hypoglycemic drugs the patient is taking are: Metformin 1 g twice a day, Victoza 1.8 mg daily in am   His last A1c was 7.9, unchanged  Current management, blood sugar patterns and problems identified:  He has gone up to the 30 unit basal on his V-go pump because of high fasting readings  Although his FASTING readings are generally better with an average about 40 mg lower than the last time urine consistent and still mostly high  Variability is somewhat less overall  Despite reminders he does not check his blood sugars after meals and only a few readings later in the day which are again mostly high  Lab glucose was 208 on the day he had a fasting reading of 139 at home after breakfast  He is trying to change the pump consistently every 24 hours as directed  Also although he is trying to bolus at mealtimes he probably will forget to do this occasionally, he does not know how often  His wife thinks he has high sugars in the morning because of eating  snacks through the night, this may be fruit, peanut butter crackers or whatever is available  Also is wife thinks that he is eating just about at all times with snacks  Weight is trending higher  He has seen the dietitian in 3/16 Not able to exercise because of back pain and musculoskeletal issues and balance       Side effects from medications have been: None Compliance with the medical regimen: Fair  Hypoglycemia:   none  Glucose monitoring:  done 1-2 times  a day         Glucometer: One Touch ultra 2 .      Blood Glucose readings by download and analysis of his glucose monitor  Mean values apply above for all meters except median for One Touch  PRE-MEAL Fasting Lunch Dinner Bedtime Overall  Glucose range: 90-248  117-183  129-189    Mean/median: 160    161+/-35     Self-care: The diet that the patient has been following is: None, some fried food and cookies, mostly sugar-free         Meals: 9 am , 2-3 pm and 7 pm     Exercise: unable to do any         Dietician visit, most recent: 03/2014  Weight history:   Wt Readings from Last 3 Encounters:  11/07/16 260 lb 3.2 oz (118 kg)  09/18/16 258 lb 8 oz (117.3 kg)  08/16/16 257 lb 12.8 oz (116.9 kg)    Glycemic control:   Lab Results  Component Value Date   HGBA1C 7.9 (H) 10/16/2016   HGBA1C 7.9 07/04/2016   HGBA1C 6.9 (H) 03/29/2016   Lab Results  Component Value Date   MICROALBUR 1.1 12/27/2015   LDLCALC 118 (H) 09/16/2015   CREATININE 1.67 (H) 10/16/2016   CREATININE 1.67 (H) 10/16/2016   Other active problems: See review of systems      Allergies as of 11/07/2016      Reactions   Penicillins Rash, Other (See Comments)   WEARS ALLERGY BRACELET Because of a history of documented adverse serious drug reaction;Medi Alert bracelet  is recommended Has patient had a PCN reaction causing immediate rash, facial/tongue/throat swelling, SOB or lightheadedness with hypotension: Yes Has patient had a  PCN reaction causing severe rash involving mucus membranes or skin necrosis: unknown Has patient had a PCN reaction that required hospitalization NO Has patient had a PCN reaction occurring within the last 10 years: NO If all of    Watermelon [citrullus Vulgaris]    Tickle in throat and cough      Medication List       Accurate as of 11/07/16  3:36 PM. Always use your most recent med list.          acetaminophen 325 MG tablet Commonly known as:  TYLENOL Take 1-2 tablets (325-650 mg total) by mouth every 4 (four) hours as needed for mild pain.   albuterol 108 (90 Base) MCG/ACT inhaler Commonly known as:  PROVENTIL HFA;VENTOLIN HFA Inhale 2 puffs into the lungs every 6 (six) hours as needed for wheezing or shortness of breath.   aspirin EC 81 MG tablet Take 81 mg by mouth daily.   betamethasone dipropionate 0.05 % cream Commonly known as:  DIPROLENE Apply topically 2 (two) times daily as needed.   clonazePAM 0.5 MG tablet Commonly known as:  KLONOPIN Take 0.5 mg by mouth at bedtime.   Fluticasone-Salmeterol 250-50 MCG/DOSE Aepb Commonly known as:  ADVAIR Inhale 1 puff into the lungs 2 (two) times daily.   furosemide 80 MG tablet Commonly known as:  LASIX TAKE 1 AND 1/2 TABLET BY MOUTH EVERY DAY   HUMALOG 100 UNIT/ML injection Generic drug:  insulin lispro USE MAX 56 UNITS PER DAY   WITH V-GO PUMP   ibuprofen 200 MG tablet Commonly known as:  ADVIL,MOTRIN Take 800 mg by mouth every 6 (six) hours as needed (For pain.).   levothyroxine 125 MCG tablet Commonly known as:  SYNTHROID, LEVOTHROID TAKE 1 TABLET EVERY DAY DAILY BEFORE BREAKFAST   lisinopril 2.5 MG tablet Commonly known as:  PRINIVIL,ZESTRIL Take 1 tablet (2.5 mg total) by mouth daily.   metFORMIN 1000 MG tablet Commonly known as:  GLUCOPHAGE TAKE 1 TABLET (1,000 MG TOTAL) BY MOUTH 2 (TWO) TIMES DAILY WITH A MEAL.   metolazone 2.5 MG tablet Commonly known as:  ZAROXOLYN Take 1 tablet ONCE A WEEK  ONLY; take 30 minutes before taking Lasix   metoprolol succinate 50 MG 24 hr tablet Commonly known as:  TOPROL-XL TAKE 1 TABLET BY MOUTH DAILY. TAKE WITH OR IMMEDIATELY FOLLOWING A MEAL.   multivitamin with minerals Tabs tablet Take 1 tablet by mouth daily.   nitroGLYCERIN 0.4 MG SL tablet Commonly known as:  NITROSTAT Place 1 tablet (0.4  mg total) under the tongue every 5 (five) minutes as needed for chest pain.   NOVOTWIST 32G X 5 MM Misc Generic drug:  Insulin Pen Needle USE AS DIRECTED EVERY DAY   ONETOUCH DELICA LANCETS 22Q Misc Use to check blood sugar 2 times per day dx code E11.49   ONETOUCH VERIO test strip Generic drug:  glucose blood USE AS DIRECTED TO CHECK BLOOD SUGAR TWICE A DAY   potassium chloride SA 20 MEQ tablet Commonly known as:  KLOR-CON M10 Take 2 tablets (40 mEq total) by mouth daily.   Pramipexole Dihydrochloride 1.5 MG Tb24 TAKE 1 TABLET DAILY   rosuvastatin 20 MG tablet Commonly known as:  CRESTOR Take 1 tablet (20 mg total) by mouth daily.   traMADol 50 MG tablet Commonly known as:  ULTRAM TAKE 1 TABLET BY MOUTH TWICE A DAY AS NEEDED   travoprost (benzalkonium) 0.004 % ophthalmic solution Commonly known as:  TRAVATAN Place 1 drop into the right eye at bedtime.   trihexyphenidyl 2 MG tablet Commonly known as:  ARTANE Take 1 tablet (2 mg total) by mouth daily.   V-GO 30 Kit Use 1 pod daily with insulin   VICTOZA 18 MG/3ML Sopn Generic drug:  liraglutide INJECT 1.8MG ONCE DAILY   vitamin B-12 1000 MCG tablet Commonly known as:  CYANOCOBALAMIN Take 1,000 mcg by mouth daily.       Allergies:  Allergies  Allergen Reactions  . Penicillins Rash and Other (See Comments)    WEARS ALLERGY BRACELET Because of a history of documented adverse serious drug reaction;Medi Alert bracelet  is recommended Has patient had a PCN reaction causing immediate rash, facial/tongue/throat swelling, SOB or lightheadedness with hypotension: Yes Has  patient had a PCN reaction causing severe rash involving mucus membranes or skin necrosis: unknown Has patient had a PCN reaction that required hospitalization NO Has patient had a PCN reaction occurring within the last 10 years: NO If all of   . Watermelon [Citrullus Vulgaris]     Tickle in throat and cough    Past Medical History:  Diagnosis Date  . AICD (automatic cardioverter/defibrillator) present    Dr Lovena Le office visit yearly  . Arthritis    cane  . Asthma   . Benign neoplasm of colon   . CAD (coronary artery disease)   . Cardiomyopathy   . Complication of anesthesia    pt states that he got a rash  . Constipation   . 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 DM (diabetes mellitus) ([place])    TYPE 2   . GERD (gastroesophageal reflux disease)   . Glaucoma    right eye  . HLD (hyperlipidemia)   . HTN (hypertension)    pt denies 08/19/12  . Hyperplasia, prostate   . Hyperthyroidism    thyroid lobectomy  . MI (myocardial infarction) [facility])    Dr Stanford Breed 2000, x3vessels bypass  . Nephrolithiasis   . OSA (obstructive sleep apnea)    AHI-28,on CPAP, noncompliant with CPAP  . Parkinson disease ([place])    1999  . PONV (postoperative nausea and vomiting)   . Restless legs   . Shortness of breath    Hx: of at all times  . Sleep apnea    does not use CPAP  . Sleep apnea, organic   . UTI (lower urinary tract infection) 09/15/12   Klebsiella  . Ventral hernia     Past Surgical History:  Procedure Laterality Date  .  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 CARDIAC CATHETERIZATION    . CATARACT EXTRACTION W/ INTRAOCULAR LENS IMPLANT     Hx: of right eye  . COLONOSCOPY N/A 10/13/2014   Procedure: COLONOSCOPY;  Surgeon: Jerene Bears, MD;  Location: [facility]COPY;  Service: Gastroenterology;  Laterality: N/A;  . COLONOSCOPY W/ BIOPSIES AND POLYPECTOMY     Hx: of  . CORONARY ARTERY BYPASS GRAFT  2000   [name], MD  . [place]  . DEEP BRAIN STIMULATOR PLACEMENT  2004   Right and left VIN stimulator placement (parkinsons)  . EYE SURGERY    . FINGER AMPUTATION     left pointer  . IMPLANTABLE CARDIOVERTER DEFIBRILLATOR IMPLANT N/A 11/13/2013   Procedure: IMPLANTABLE CARDIOVERTER DEFIBRILLATOR IMPLANT;  Surgeon: Evans Lance, MD;  Location: [facility] CATH LAB;  Service: Cardiovascular;  Laterality: N/A;  . INSERT / REPLACE / REMOVE PACEMAKER    . LEFT AND RIGHT HEART CATHETERIZATION WITH CORONARY ANGIOGRAM N/A 09/24/2013   Procedure: LEFT AND RIGHT HEART CATHETERIZATION WITH CORONARY ANGIOGRAM;  Surgeon: Burnell Blanks, MD;  Location: [facility] C[facility] LAB;  Service: Cardiovascular;  Laterality: N/A;  . LITHOTRIPSY     3 different times  . MEDIAN STERNOTOMY  2000  . SUBTHALAMIC STIMULATOR BATTERY REPLACEMENT N/A 09/05/2012   Procedure: Deep brain stimulator battery change;  Surgeon: Erline Levine, MD;  Location: [place] NEURO ORS;  Service: Neurosurgery;  Laterality: N/A;  Deep brain stimulator battery change  . SUBTHALAMIC STIMULATOR BATTERY REPLACEMENT N/A 06/10/2015   Procedure: Deep Brain stimulator battery change;  Surgeon: Erline Levine, MD;  Location: [place] [facility]URO ORS;  Service: Neurosurgery;  Laterality: N/A;  . TONSILLECTOMY      Family History  Problem Relation Age of Onset  . Aneurysm Mother   . Alcoholism Father   . Autoimmune disease Brother 75       AIDS  . Peripheral vascular disease Unknown   . Arthritis Unknown   . Diabetes Neg Hx   . Heart disease Neg Hx   . Colon cancer Neg Hx   . Prostate cancer Neg Hx     Social History:  reports that he has never smoked. He has never used smokeless tobacco. He reports that he drinks alcohol. He reports that he does not use drugs.    Review of Systems     Most recent eye exam was done in 2/18    HYPERLIPIDEMIA:  He is on Crestor with good control, has low HDL       Lab Results  Component Value Date   CHOL 111 10/16/2016   CHOL 132 12/06/2015    CHOL 182 09/16/2015   Lab Results  Component Value Date   HDL 27.80 (L) 10/16/2016   HDL 30.20 (L) 12/06/2015   HDL 31.30 (L) 09/16/2015   Lab Results  Component Value Date   LDLCALC 118 (H) 09/16/2015   LDLCALC 126 (H) 04/08/2013   LDLCALC 51 02/28/2012   Lab Results  Component Value Date   TRIG 287.0 (H) 10/16/2016   TRIG 217.0 (H) 12/06/2015   TRIG 165.0 (H) 09/16/2015   Lab Results  Component Value Date   CHOLHDL 4 10/16/2016   CHOLHDL 4 12/06/2015   CHOLHDL 6 09/16/2015   Lab Results  Component Value Date   LDLDIRECT 52.0 10/16/2016   LDLDIRECT 69.0 12/06/2015   LDLDIRECT 86.0 05/19/2014  Thyroid:   He has had hypothyroidism following radioactive iodine treatment for hyperthyroidism in 2013  TSH is usually normal with  dosage of 125 g   Lab Results  Component Value Date   TSH 2.35 03/29/2016   TSH 3.58 09/16/2015   TSH 2.06 12/15/2014   FREET4 0.82 08/20/2014   FREET4 0.98 11/20/2011   FREET4 0.91 10/16/2011       The blood pressure has been followed by  other physicians Also on multiple diuretics from cardiologist  CKD: Renal dysfunction patterns as follows: Has not had any electrolyte disturbances recently  Lab Results  Component Value Date   CREATININE 1.67 (H) 10/16/2016   CREATININE 1.67 (H) 10/16/2016   CREATININE 1.53 (H) 05/22/2016     Diabetic foot exam in 03/2016 showed  normal monofilament sensation in the toes except decreased on the right great toe and plantar surfaces, no skin lesions or ulcers on the feet and normal pedal pulses    Physical Examination:  BP 114/78   Pulse 87   Ht _0  (1.753 m)   Wt 260 lb 3.2 oz (118 kg)   SpO2 93%   BMI 38.42 kg/m      ASSESSMENT/PLAN  Diabetes type 2, uncontrolled with obesity    See history of present illness for detailed discussion of his current management, blood sugar patterns and problems identified  His recent A1c is again 7.9  On a regimen of basal bolus  insulin with the V-go pump and also Victoza  His main difficulty is getting consistent diet as well as bolusing for all meals and snacks However difficult to know what his blood sugar patterns as he mostly checks blood sugars in the mornings and repeatedly forgets to protect readings after meals Blood sugars in the morning probably fluctuate because of his variable meals and snacks especially overnight when he is not sleeping well Diet can be more consistent also  Recommendations:  Since he has readings as low as 90 with the 30 unit basal will not change the pump  Discussed the use of the Omnipod pump as a possible option which would allow more flexibility and may be better covered by the insurance his wife does not think that he can manage this and wants to use a simple regimen  Also most likely will not be able to handle multiple injections a basal bolus regimen  Have encourage him to start checking blood sugars more consistently after meals especially after supper but no numbness or leaving morning  He does need to bolus 6 units for breakfast since appears that he has higher readings after breakfast meal as seen in the lab  Probably needs about the same amount of boluses for other meals but more if he is eating larger carbohydrate meals in the evenings  Since his renal function is inadequate he will need to reduce his total metformin dose at 1500 mg, using half tablet in the morning  Encouraged him to be as active as possible  He will call back if he wants to switch to the Omnipod pump and will need to be trained on this  LIPIDS: LDL controlled, has persistently low HDL from diabetic dyslipidemia  Hypothyroid: Will need follow-up on next visit   Patient Instructions  Take 1/2 Metformin in am and 1 at dinner  Take 3 clicj]ks at Breakfast not 2.    Counseling time on subjects discussed in assessment and plan sections is over 50% of today's 25 minute  visit  Vira Chaplin 11/07/2016, 3:36 PM   Note: This office note was prepared with Dragon voice recognition system technology. Any transcriptional errors that result from this process are unintentional.

## 2016-11-08 ENCOUNTER — Other Ambulatory Visit: Payer: Self-pay | Admitting: Endocrinology

## 2016-11-09 DIAGNOSIS — M79605 Pain in left leg: Secondary | ICD-10-CM | POA: Diagnosis not present

## 2016-11-09 DIAGNOSIS — M79604 Pain in right leg: Secondary | ICD-10-CM | POA: Diagnosis not present

## 2016-11-09 DIAGNOSIS — M545 Low back pain: Secondary | ICD-10-CM | POA: Diagnosis not present

## 2016-11-15 DIAGNOSIS — M79605 Pain in left leg: Secondary | ICD-10-CM | POA: Diagnosis not present

## 2016-11-15 DIAGNOSIS — M545 Low back pain: Secondary | ICD-10-CM | POA: Diagnosis not present

## 2016-11-15 DIAGNOSIS — M79604 Pain in right leg: Secondary | ICD-10-CM | POA: Diagnosis not present

## 2016-11-17 DIAGNOSIS — M545 Low back pain: Secondary | ICD-10-CM | POA: Diagnosis not present

## 2016-11-17 DIAGNOSIS — M79604 Pain in right leg: Secondary | ICD-10-CM | POA: Diagnosis not present

## 2016-11-17 DIAGNOSIS — M79605 Pain in left leg: Secondary | ICD-10-CM | POA: Diagnosis not present

## 2016-11-20 DIAGNOSIS — M545 Low back pain: Secondary | ICD-10-CM | POA: Diagnosis not present

## 2016-11-20 DIAGNOSIS — M79605 Pain in left leg: Secondary | ICD-10-CM | POA: Diagnosis not present

## 2016-11-20 DIAGNOSIS — M79604 Pain in right leg: Secondary | ICD-10-CM | POA: Diagnosis not present

## 2016-11-22 ENCOUNTER — Encounter: Payer: Self-pay | Admitting: *Deleted

## 2016-11-22 DIAGNOSIS — H40032 Anatomical narrow angle, left eye: Secondary | ICD-10-CM | POA: Diagnosis not present

## 2016-11-22 DIAGNOSIS — H35341 Macular cyst, hole, or pseudohole, right eye: Secondary | ICD-10-CM | POA: Diagnosis not present

## 2016-11-22 DIAGNOSIS — M79604 Pain in right leg: Secondary | ICD-10-CM | POA: Diagnosis not present

## 2016-11-22 DIAGNOSIS — M545 Low back pain: Secondary | ICD-10-CM | POA: Diagnosis not present

## 2016-11-22 DIAGNOSIS — H35033 Hypertensive retinopathy, bilateral: Secondary | ICD-10-CM | POA: Diagnosis not present

## 2016-11-22 DIAGNOSIS — M79605 Pain in left leg: Secondary | ICD-10-CM | POA: Diagnosis not present

## 2016-11-22 DIAGNOSIS — H4030X1 Glaucoma secondary to eye trauma, unspecified eye, mild stage: Secondary | ICD-10-CM | POA: Diagnosis not present

## 2016-11-22 LAB — HM DIABETES EYE EXAM

## 2016-11-22 NOTE — Progress Notes (Deleted)
HPI: FU coronary artery disease status post bypass and graft, Parkinson's, diabetes and hypertension as well as hyperlipidemia. Carotid Dopplers in January 2005 showed 0-39% stenosis. Last nuclear study 6/15 showed EF 41, prior MI; no ischemia. Echo 8/15 showed EF 30-35, moderate LAE. TSH 7/15 10.27. Cardiac catheterization September 2015 showed a pulmonary Wedge pressure of 16 and PA pressure 32/20. The LAD was occluded. No obstructive disease in the circumflex. The right coronary had severe diffuse distal disease. Saphenous vein graft to the PDA was patent. Saphenous vein graft to diagonal patent. LIMA to the mid LAD patent. Medical therapy recommended. Had ICD placed November 2015. Since last seen,   Current Outpatient Medications  Medication Sig Dispense Refill  . acetaminophen (TYLENOL) 325 MG tablet Take 1-2 tablets (325-650 mg total) by mouth every 4 (four) hours as needed for mild pain.    Marland Kitchen albuterol (PROVENTIL HFA;VENTOLIN HFA) 108 (90 Base) MCG/ACT inhaler Inhale 2 puffs into the lungs every 6 (six) hours as needed for wheezing or shortness of breath. 18 g 5  . aspirin EC 81 MG tablet Take 81 mg by mouth daily.    . betamethasone dipropionate (DIPROLENE) 0.05 % cream Apply topically 2 (two) times daily as needed. 45 g 1  . clonazePAM (KLONOPIN) 0.5 MG tablet Take 0.5 mg by mouth at bedtime.    . Fluticasone-Salmeterol (ADVAIR) 250-50 MCG/DOSE AEPB Inhale 1 puff into the lungs 2 (two) times daily. 60 each 5  . furosemide (LASIX) 80 MG tablet TAKE 1 AND 1/2 TABLET BY MOUTH EVERY DAY 135 tablet 0  . HUMALOG 100 UNIT/ML injection USE MAX 56 UNITS PER DAY   WITH V-GO PUMP 60 mL 1  . ibuprofen (ADVIL,MOTRIN) 200 MG tablet Take 800 mg by mouth every 6 (six) hours as needed (For pain.).     . Insulin Disposable Pump (V-GO 30) KIT Use 1 pod daily with insulin 90 kit 3  . levothyroxine (SYNTHROID, LEVOTHROID) 125 MCG tablet TAKE 1 TABLET EVERY DAY DAILY BEFORE BREAKFAST 90 tablet 1  .  lisinopril (PRINIVIL,ZESTRIL) 2.5 MG tablet Take 1 tablet (2.5 mg total) by mouth daily. 30 tablet 12  . metFORMIN (GLUCOPHAGE) 1000 MG tablet TAKE 1 TABLET (1,000 MG TOTAL) BY MOUTH 2 (TWO) TIMES DAILY WITH A MEAL. 180 tablet 3  . metolazone (ZAROXOLYN) 2.5 MG tablet Take 1 tablet ONCE A WEEK ONLY; take 30 minutes before taking Lasix 30 tablet 3  . metoprolol succinate (TOPROL-XL) 50 MG 24 hr tablet TAKE 1 TABLET BY MOUTH DAILY. TAKE WITH OR IMMEDIATELY FOLLOWING A MEAL. 30 tablet 3  . Multiple Vitamin (MULTIVITAMIN WITH MINERALS) TABS tablet Take 1 tablet by mouth daily.     . nitroGLYCERIN (NITROSTAT) 0.4 MG SL tablet Place 1 tablet (0.4 mg total) under the tongue every 5 (five) minutes as needed for chest pain. 25 tablet 2  . NOVOTWIST 32G X 5 MM MISC USE AS DIRECTED EVERY DAY 100 each 2  . ONETOUCH DELICA LANCETS 22Q MISC Use to check blood sugar 2 times per day dx code E11.49 100 each 5  . ONETOUCH VERIO test strip USE AS DIRECTED TO CHECK BLOOD SUGAR TWICE A DAY 100 each 5  . potassium chloride SA (K-DUR,KLOR-CON) 20 MEQ tablet Take 2 tablets (40 mEq total) by mouth daily. 180 tablet 3  . Pramipexole Dihydrochloride 1.5 MG TB24 TAKE 1 TABLET DAILY 90 tablet 0  . rosuvastatin (CRESTOR) 20 MG tablet Take 1 tablet (20 mg total) by mouth daily.  90 tablet 3  . traMADol (ULTRAM) 50 MG tablet TAKE 1 TABLET BY MOUTH TWICE A DAY AS NEEDED 60 tablet 1  . travoprost, benzalkonium, (TRAVATAN) 0.004 % ophthalmic solution Place 1 drop into the right eye at bedtime.     . trihexyphenidyl (ARTANE) 2 MG tablet Take 1 tablet (2 mg total) by mouth daily. 90 tablet 1  . VICTOZA 18 MG/3ML SOPN INJECT 1.'8MG'$  ONCE DAILY 27 mL 1  . vitamin B-12 (CYANOCOBALAMIN) 1000 MCG tablet Take 1,000 mcg by mouth daily.     No current facility-administered medications for this visit.      Past Medical History:  Diagnosis Date  . AICD (automatic cardioverter/defibrillator) present    Dr Lovena Le office visit yearly  .  Arthritis    cane  . Asthma   . Benign neoplasm of colon   . CAD (coronary artery disease)   . Cardiomyopathy   . Complication of anesthesia    pt states that he got a rash  . Constipation   . 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 DM (diabetes mellitus) ([place])    TYPE 2   . GERD (gastroesophageal reflux disease)   . Glaucoma    right eye  . HLD (hyperlipidemia)   . HTN (hypertension)    pt denies 08/19/12  . Hyperplasia, prostate   . Hyperthyroidism    thyroid lobectomy  . MI (myocardial infarction) [facility])    Dr Stanford Breed 2000, x3vessels bypass  . Nephrolithiasis   . OSA (obstructive sleep apnea)    AHI-28,on CPAP, noncompliant with CPAP  . Parkinson disease ([place])    1999  . PONV (postoperative nausea and vomiting)   . Restless legs   . Shortness of breath    Hx: of at all times  . Sleep apnea    does not use CPAP  . Sleep apnea, organic   . UTI (lower urinary tract infection) 09/15/12   Klebsiella  . Ventral hernia     Past Surgical History:  Procedure Laterality Date  . 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 CARDIAC CATHETERIZATION    . CATARACT EXTRACTION W/ INTRAOCULAR LENS IMPLANT     Hx: of right eye  . COLONOSCOPY W/ BIOPSIES AND POLYPECTOMY     Hx: of  . CORONARY ARTERY BYPASS GRAFT  2000   [name], MD  . [place]  . DEEP BRAIN STIMULATOR PLACEMENT  2004   Right and left VIN stimulator placement (parkinsons)  . EYE SURGERY    . FINGER AMPUTATION     left pointer  . INSERT / REPLACE / REMOVE PACEMAKER    . LITHOTRIPSY     3 different times  . MEDIAN STERNOTOMY  2000  . TONSILLECTOMY      Social History   Socioeconomic History  . Marital status: Married    Spouse name: [name]  . Number of children: 2  . Years of education: Not on file  . Highest education level: Not on file  Social Needs  . Financial resource strain: Not on file  . Food insecurity - worry: Not on file  . Food  insecurity - inability: Not on file  . Transportation needs - medical: Not on file  . Transportation needs - non-medical: Not on file  Occupational History  . 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 Smoking status: Never Smoker  . Smokeless  tobacco: Never Used  Substance and Sexual Activity  . Alcohol use: Yes    Comment: occasional beer  . Drug use: No  . Sexual activity: Not on file  Other Topics Concern  . Not on file  Social History Narrative   From [facility]   Retired/disability Clinical research associate   Likes to fish.     Married 1972   3 kids   [place] fan    Family History  Problem Relation Age of Onset  . Aneurysm Mother   . Alcoholism Father   . Autoimmune disease Brother 51       AIDS  . Peripheral vascular disease Unknown   . Arthritis Unknown   . Diabetes Neg Hx   . Heart disease Neg Hx   . Colon cancer Neg Hx   . Prostate cancer Neg Hx     ROS: no fevers or chills, productive cough, hemoptysis, dysphasia, odynophagia, melena, hematochezia, dysuria, hematuria, rash, seizure activity, orthopnea, PND, pedal edema, claudication. Remaining systems are negative.  Physical Exam: Well-developed well-nourished in no acute distress.  Skin is warm and dry.  HEENT is normal.  Neck is supple.  Chest is clear to auscultation with normal expansion.  Cardiovascular exam is regular rate and rhythm.  Abdominal exam nontender or distended. No masses palpated. Extremities show no edema. neuro grossly intact  ECG- personally reviewed  A/P  1  Kirk Ruths, MD

## 2016-11-23 ENCOUNTER — Other Ambulatory Visit: Payer: Self-pay

## 2016-11-23 MED ORDER — ROSUVASTATIN CALCIUM 20 MG PO TABS: 20.0000 mg | ORAL_TABLET | Freq: Every day | ORAL | 3 refills | Status: DC

## 2016-11-27 ENCOUNTER — Encounter: Payer: Self-pay | Admitting: Family Medicine

## 2016-11-27 DIAGNOSIS — M79605 Pain in left leg: Secondary | ICD-10-CM | POA: Diagnosis not present

## 2016-11-27 DIAGNOSIS — M545 Low back pain: Secondary | ICD-10-CM | POA: Diagnosis not present

## 2016-11-27 DIAGNOSIS — M79604 Pain in right leg: Secondary | ICD-10-CM | POA: Diagnosis not present

## 2016-11-29 ENCOUNTER — Other Ambulatory Visit: Payer: Self-pay | Admitting: Cardiology

## 2016-11-29 NOTE — Progress Notes (Signed)
HPI: FU coronary artery disease status post bypass and graft, Parkinson's, diabetes and hypertension as well as hyperlipidemia. Carotid Dopplers in January 2005 showed 0-39% stenosis. Last nuclear study 6/15 showed EF 41, prior MI; no ischemia. Echo 8/15 showed EF 30-35, moderate LAE. Cardiac catheterization September 2015 showed a pulmonary Wedge pressure of 16 and PA pressure 32/20. The LAD was occluded. No obstructive disease in the circumflex. The right coronary had severe diffuse distal disease. Saphenous vein graft to the PDA was patent. Saphenous vein graft to diagonal patent. LIMA to the mid LAD patent. Medical therapy recommended. Had ICD placed November 2015. Since last seen, patient has mild dyspnea on exertion. No orthopnea, PND, pedal edema, exertional chest pain or syncope. Describes fatigue.  Current Outpatient Medications  Medication Sig Dispense Refill  . acetaminophen (TYLENOL) 325 MG tablet Take 1-2 tablets (325-650 mg total) by mouth every 4 (four) hours as needed for mild pain.    Marland Kitchen albuterol (PROVENTIL HFA;VENTOLIN HFA) 108 (90 Base) MCG/ACT inhaler Inhale 2 puffs into the lungs every 6 (six) hours as needed for wheezing or shortness of breath. 18 g 5  . aspirin EC 81 MG tablet Take 81 mg by mouth daily.    . betamethasone dipropionate (DIPROLENE) 0.05 % cream Apply topically 2 (two) times daily as needed. 45 g 1  . clonazePAM (KLONOPIN) 0.5 MG tablet Take 0.5 mg by mouth at bedtime.    . Fluticasone-Salmeterol (ADVAIR) 250-50 MCG/DOSE AEPB Inhale 1 puff into the lungs 2 (two) times daily. 60 each 5  . furosemide (LASIX) 80 MG tablet TAKE 1 AND 1/2 TABLET BY MOUTH EVERY DAY 135 tablet 0  . HUMALOG 100 UNIT/ML injection USE MAX 56 UNITS PER DAY   WITH V-GO PUMP 60 mL 1  . ibuprofen (ADVIL,MOTRIN) 200 MG tablet Take 800 mg by mouth every 6 (six) hours as needed (For pain.).     . Insulin Disposable Pump (V-GO 30) KIT Use 1 pod daily with insulin 90 kit 3  . levothyroxine  (SYNTHROID, LEVOTHROID) 125 MCG tablet TAKE 1 TABLET EVERY DAY DAILY BEFORE BREAKFAST 90 tablet 1  . metFORMIN (GLUCOPHAGE) 1000 MG tablet TAKE 1 TABLET (1,000 MG TOTAL) BY MOUTH 2 (TWO) TIMES DAILY WITH A MEAL. 180 tablet 3  . metolazone (ZAROXOLYN) 2.5 MG tablet Take 1 tablet ONCE A WEEK ONLY; take 30 minutes before taking Lasix 30 tablet 3  . metolazone (ZAROXOLYN) 2.5 MG tablet 1 TAB ONCE DAILY X 2 DAYS STARTING MONDAY TAKE 1 TAB MON/WED/FRID 30 MIN BEFORE LASIX 30 tablet 0  . metoprolol succinate (TOPROL-XL) 50 MG 24 hr tablet TAKE 1 TABLET BY MOUTH DAILY. TAKE WITH OR IMMEDIATELY FOLLOWING A MEAL. 90 tablet 0  . Multiple Vitamin (MULTIVITAMIN WITH MINERALS) TABS tablet Take 1 tablet by mouth daily.     Marland Kitchen NOVOTWIST 32G X 5 MM MISC USE AS DIRECTED EVERY DAY 100 each 2  . ONETOUCH DELICA LANCETS 09Q MISC Use to check blood sugar 2 times per day dx code E11.49 100 each 5  . ONETOUCH VERIO test strip USE AS DIRECTED TO CHECK BLOOD SUGAR TWICE A DAY 100 each 5  . potassium chloride SA (K-DUR,KLOR-CON) 20 MEQ tablet Take 2 tablets (40 mEq total) by mouth daily. 180 tablet 3  . Pramipexole Dihydrochloride 1.5 MG TB24 TAKE 1 TABLET DAILY 90 tablet 0  . rosuvastatin (CRESTOR) 20 MG tablet Take 1 tablet (20 mg total) daily by mouth. 90 tablet 3  . traMADol (ULTRAM)  50 MG tablet TAKE 1 TABLET BY MOUTH TWICE A DAY AS NEEDED 60 tablet 1  . travoprost, benzalkonium, (TRAVATAN) 0.004 % ophthalmic solution Place 1 drop into the right eye at bedtime.     . trihexyphenidyl (ARTANE) 2 MG tablet Take 1 tablet (2 mg total) by mouth daily. 90 tablet 1  . VICTOZA 18 MG/3ML SOPN INJECT 1.'8MG'$  ONCE DAILY 27 mL 1  . vitamin B-12 (CYANOCOBALAMIN) 1000 MCG tablet Take 1,000 mcg by mouth daily.    Marland Kitchen lisinopril (PRINIVIL,ZESTRIL) 2.5 MG tablet Take 1 tablet (2.5 mg total) by mouth daily. 30 tablet 12  . nitroGLYCERIN (NITROSTAT) 0.4 MG SL tablet Place 1 tablet (0.4 mg total) under the tongue every 5 (five) minutes as needed  for chest pain. 25 tablet 2   No current facility-administered medications for this visit.      Past Medical History:  Diagnosis Date  . AICD (automatic cardioverter/defibrillator) present    Dr Lovena Le office visit yearly  . Arthritis    cane  . Asthma   . Benign neoplasm of colon   . CAD (coronary artery disease)   . Cardiomyopathy   . Complication of anesthesia    pt states that he got a rash  . Constipation   . 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 DM (diabetes mellitus) ([place])    TYPE 2   . GERD (gastroesophageal reflux disease)   . Glaucoma    right eye  . HLD (hyperlipidemia)   . HTN (hypertension)    pt denies 08/19/12  . Hyperplasia, prostate   . Hyperthyroidism    thyroid lobectomy  . MI (myocardial infarction) [facility])    Dr Stanford Breed 2000, x3vessels bypass  . Nephrolithiasis   . OSA (obstructive sleep apnea)    AHI-28,on CPAP, noncompliant with CPAP  . Parkinson disease ([place])    1999  . PONV (postoperative nausea and vomiting)   . Restless legs   . Shortness of breath    Hx: of at all times  . Sleep apnea    does not use CPAP  . Sleep apnea, organic   . UTI (lower urinary tract infection) 09/15/12   Klebsiella  . Ventral hernia     Past Surgical History:  Procedure Laterality Date  . 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 CARDIAC CATHETERIZATION    . CATARACT EXTRACTION W/ INTRAOCULAR LENS IMPLANT     Hx: of right eye  . COLONOSCOPY N/A 10/13/2014   Procedure: COLONOSCOPY;  Surgeon: Jerene Bears, MD;  Location: [facility] ENDOSCOPY;  Service: Gastroenterology;  Laterality: N/A;  . COLONOSCOPY W/ BIOPSIES AND POLYPECTOMY     Hx: of  . CORONARY ARTERY BYPASS GRAFT  2000   [name], MD  . [place]  . DEEP BRAIN STIMULATOR PLACEMENT  2004   Right and left VIN stimulator placement (parkinsons)  . EYE SURGERY    . FINGER AMPUTATION     left pointer  . IMPLANTABLE CARDIOVERTER DEFIBRILLATOR  IMPLANT N/A 11/13/2013   Procedure: IMPLANTABLE CARDIOVERTER DEFIBRILLATOR IMPLANT;  Surgeon: Evans Lance, MD;  Location: [facility] CATH LAB;  Service: Cardiovascular;  Laterality: N/A;  . INSERT / REPLACE / REMOVE PACEMAKER    . LEFT AND RIGHT HEART CATHETERIZATION WITH CORONARY ANGIOGRAM N/A 09/24/2013   Procedure: LEFT AND RIGHT HEART CATHETERIZATION WITH CORONARY ANGIOGRAM;  Surgeon: Burnell Blanks, MD;  Location: [facility] CATH LAB;  Service: Cardiovascular;  Laterality: N/A;  . LITHOTRIPSY     3 different times  . MEDIAN STERNOTOMY  2000  . SUBTHALAMIC STIMULATOR BATTERY REPLACEMENT N/A 09/05/2012   Procedure: Deep brain stimulator battery change;  Surgeon: Erline Levine, MD;  Location: [place] NEURO ORS;  Service: Neurosurgery;  Laterality: N/A;  Deep brain stimulator battery change  . SUBTHALAMIC STIMULATOR BATTERY REPLACEMENT N/A 06/10/2015   Procedure: Deep Brain stimulator battery change;  Surgeon: Erline Levine, MD;  Location: [place] NEURO ORS;  Service: Neurosurgery;  Laterality: N/A;  . TONSILLECTOMY      Social History   Socioeconomic History  . Marital status: Married    Spouse name: [name]  . Number of children: 2  . Years of education: Not on file  . Highest education level: Not on file  Social Needs  . Financial resource strain: Not on file  . Food insecurity - worry: Not on file  . Food insecurity - inability: Not on file  . Transportation needs - medical: Not on file  . Transportation needs - non-medical: Not on file  Occupational History  . 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 Smoking status: Never Smoker  . Smokeless tobacco: Never Used  Substance and Sexual Activity  . Alcohol use: Yes    Comment: occasional beer  . Drug use: No  . Sexual activity: Not on file  Other Topics Concern  . Not on file  Social History Narrative   From [facility]   Retired/disability Clinical research associate   Likes to fish.     Married 1972   3 kids   [place] fan    Family History  Problem Relation Age of Onset  . Aneurysm Mother   . Alcoholism Father   . HIV/AIDS Brother 48       AIDS  . Healthy Child   . Peripheral vascular disease Unknown   . Arthritis Unknown   . Healthy Child   . Healthy Child   . Diabetes Neg Hx   . Heart disease Neg Hx   . Colon cancer Neg Hx   . Prostate cancer Neg Hx     ROS: fatigue but no fevers or chills, productive cough, hemoptysis, dysphasia, odynophagia, melena, hematochezia, dysuria, hematuria, rash, seizure activity, orthopnea, PND, pedal edema, claudication. Remaining systems are negative.  Physical Exam: Well-developed obese in no acute distress.  Skin is warm and dry.  HEENT is normal.  Neck is supple.  Chest is clear to auscultation with normal expansion.  Cardiovascular exam is regular rate and rhythm.  Abdominal exam nontender or distended. No masses palpated. Extremities show no edema. neuro c/w Parkinson's   A/P  1 coronary artery disease-patient doing well with no chest pain or dyspnea. Continue aspirin and statin.  2 ischemic cardiomyopathy-continue ACE inhibitor and beta blocker.  3 hypertension-blood pressure is controlled. Continue present medications.  4 hyperlipidemia-continue statin.  5 chronic systolic congestive heart failure-patient is taking metolazone 3 times weekly. His blood pressure is mildly decreased. He may be dehydrated. I have asked him to take this 3 times weekly only as needed for weight gain of 2-3 pounds. Check potassium and renal function. We discussed the importance of fluid restriction and low sodium diet. He will contact as if his volume status worsens. They did not want entresto due to cost; will see if they qualify for assistance.  6 prior ICD-management per electrophysiology.  Kirk Ruths, MD

## 2016-12-04 ENCOUNTER — Other Ambulatory Visit: Payer: Self-pay | Admitting: *Deleted

## 2016-12-04 MED ORDER — METOPROLOL SUCCINATE ER 50 MG PO TB24: ORAL_TABLET | ORAL | 0 refills | Status: DC

## 2016-12-05 ENCOUNTER — Ambulatory Visit: Payer: Medicare Other | Admitting: Cardiology

## 2016-12-05 DIAGNOSIS — M79605 Pain in left leg: Secondary | ICD-10-CM | POA: Diagnosis not present

## 2016-12-05 DIAGNOSIS — M79604 Pain in right leg: Secondary | ICD-10-CM | POA: Diagnosis not present

## 2016-12-05 DIAGNOSIS — M545 Low back pain: Secondary | ICD-10-CM | POA: Diagnosis not present

## 2016-12-07 ENCOUNTER — Ambulatory Visit (INDEPENDENT_AMBULATORY_CARE_PROVIDER_SITE_OTHER): Payer: Medicare Other | Admitting: Cardiology

## 2016-12-07 ENCOUNTER — Encounter: Payer: Self-pay | Admitting: Cardiology

## 2016-12-07 VITALS — BP 96/54 | HR 82 | Ht 68.0 in | Wt 254.0 lb

## 2016-12-07 DIAGNOSIS — M79604 Pain in right leg: Secondary | ICD-10-CM | POA: Diagnosis not present

## 2016-12-07 DIAGNOSIS — I10 Essential (primary) hypertension: Secondary | ICD-10-CM

## 2016-12-07 DIAGNOSIS — I38 Endocarditis, valve unspecified: Secondary | ICD-10-CM

## 2016-12-07 DIAGNOSIS — I2510 Atherosclerotic heart disease of native coronary artery without angina pectoris: Secondary | ICD-10-CM

## 2016-12-07 DIAGNOSIS — I255 Ischemic cardiomyopathy: Secondary | ICD-10-CM | POA: Diagnosis not present

## 2016-12-07 DIAGNOSIS — I5022 Chronic systolic (congestive) heart failure: Secondary | ICD-10-CM

## 2016-12-07 DIAGNOSIS — E7800 Pure hypercholesterolemia, unspecified: Secondary | ICD-10-CM | POA: Diagnosis not present

## 2016-12-07 DIAGNOSIS — M545 Low back pain: Secondary | ICD-10-CM | POA: Diagnosis not present

## 2016-12-07 DIAGNOSIS — M79605 Pain in left leg: Secondary | ICD-10-CM | POA: Diagnosis not present

## 2016-12-07 MED ORDER — METOLAZONE 2.5 MG PO TABS: ORAL_TABLET | ORAL | 0 refills | Status: DC

## 2016-12-07 NOTE — Patient Instructions (Signed)
Medication Instructions:   TAKE METOLAZONE ONLY AS NEEDED FOR WEIGHT GAIN OF 2-3 POUNDS IN ONE DAY= DO NOT TAKE MORE THAN 3 TIMES IN ONE WEEK  Labwork:  Your physician recommends that you HAVE LAB WORK TODAY  Follow-Up:  Your physician recommends that you schedule a follow-up appointment in: 2-4 [place] physician wants you to follow-up in: [place] will receive a reminder letter in the mail two months in advance. If you don't receive a letter, please call our office to schedule the follow-up appointment.    If you need a refill on your cardiac medications before your next appointment, please call your pharmacy.

## 2016-12-08 ENCOUNTER — Telehealth: Payer: Self-pay | Admitting: *Deleted

## 2016-12-08 ENCOUNTER — Other Ambulatory Visit: Payer: Self-pay | Admitting: *Deleted

## 2016-12-08 DIAGNOSIS — N289 Disorder of kidney and ureter, unspecified: Secondary | ICD-10-CM

## 2016-12-08 LAB — BASIC METABOLIC PANEL
BUN / CREAT RATIO: 24 (ref 10–24)
BUN: 55 mg/dL — ABNORMAL HIGH (ref 8–27)
CO2: 29 mmol/L (ref 20–29)
Calcium: 9.7 mg/dL (ref 8.6–10.2)
Chloride: 91 mmol/L — ABNORMAL LOW (ref 96–106)
Creatinine, Ser: 2.27 mg/dL — ABNORMAL HIGH (ref 0.76–1.27)
GFR calc Af Amer: 34 mL/min/{1.73_m2} — ABNORMAL LOW (ref >59)
GFR, EST NON AFRICAN AMERICAN: 29 mL/min/{1.73_m2} — AB (ref >59)
GLUCOSE: 172 mg/dL — AB (ref 65–99)
Potassium: 3.7 mmol/L (ref 3.5–5.2)
SODIUM: 142 mmol/L (ref 134–144)

## 2016-12-08 MED ORDER — FUROSEMIDE 80 MG PO TABS: 120.0000 mg | ORAL_TABLET | Freq: Every day | ORAL | 0 refills | Status: DC

## 2016-12-08 NOTE — Telephone Encounter (Signed)
Advised patient of lab results  Will check lab next week

## 2016-12-08 NOTE — Telephone Encounter (Signed)
-----   Message from Lelon Perla, MD sent at 12/08/2016  7:06 AM EST ----- Renal function worse likely related to overdiuresis; take metolazone 3 times weekly only as needed as discussed in office; repeat bmet 1 week Kirk Ruths

## 2016-12-10 ENCOUNTER — Other Ambulatory Visit: Payer: Self-pay | Admitting: Endocrinology

## 2016-12-12 DIAGNOSIS — M79604 Pain in right leg: Secondary | ICD-10-CM | POA: Diagnosis not present

## 2016-12-12 DIAGNOSIS — M545 Low back pain: Secondary | ICD-10-CM | POA: Diagnosis not present

## 2016-12-12 DIAGNOSIS — M79605 Pain in left leg: Secondary | ICD-10-CM | POA: Diagnosis not present

## 2016-12-14 DIAGNOSIS — N289 Disorder of kidney and ureter, unspecified: Secondary | ICD-10-CM | POA: Diagnosis not present

## 2016-12-14 LAB — BASIC METABOLIC PANEL
BUN / CREAT RATIO: 16 (ref 10–24)
BUN: 30 mg/dL — ABNORMAL HIGH (ref 8–27)
CO2: 27 mmol/L (ref 20–29)
Calcium: 8.9 mg/dL (ref 8.6–10.2)
Chloride: 97 mmol/L (ref 96–106)
Creatinine, Ser: 1.85 mg/dL — ABNORMAL HIGH (ref 0.76–1.27)
GFR calc non Af Amer: 37 mL/min/{1.73_m2} — ABNORMAL LOW (ref >59)
GFR, EST AFRICAN AMERICAN: 43 mL/min/{1.73_m2} — AB (ref >59)
Glucose: 213 mg/dL — ABNORMAL HIGH (ref 65–99)
POTASSIUM: 4.6 mmol/L (ref 3.5–5.2)
SODIUM: 140 mmol/L (ref 134–144)

## 2016-12-14 NOTE — Progress Notes (Signed)
Henry [name] was seen today in the movement disorders clinic for neurologic consultation at the request of [name].  His PCP is Tonia Ghent, MD.  The consultation is for the evaluation of PD and to manage his DBS.  He is accompanied by his wife who supplements the history.  The first symptom(s) the patient noticed was right hand tremor in 1999.  He was seen by neurology and was dx with PD.  He was placed on something, but it caused sleepiness.  He does not think that he has ever been on levodopa.   He was placed on Mirapex, which seemed to help.  He only takes it twice per day.  He didn't think it made a difference when he took it tid.  He began to have tremor and it was suggested he do DBS.    The pt is s/p stn DBS in 2005.  He had a battery change in 2009.    10/02/12 update: The patient is accompanied today by his daughter, who supplements the history.  I reviewed medical records available to me since last visit.  The patient had his generator changed on 09/05/2012.  He remains on pramipexole, 0.5 mg twice per day.  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 [name] in that regard.  He is not compliant with CPAP so says that he doesn't want to return to her for f/u. He doesn't take the klonopin faithfully.  10/11/12 update:  Pt was seen as a walk in/work in today.  Pt had increasing tremor, L greater than R for 3 days.  Thinks that it started with the d/c of artane.  Speech stable.  11/19/12 update:  Pt is seen today for his PD, accompanied by his daughter who supplements the hx.    He is currently on klonopin 0.5 mg - 1/2-1 tablet q hs.  He only takes it when his wife is not working.  She works nights and is only in 2 days per week.  He has some reluctance to take it other nights.  He is on pramipexole 0.5 mg bid.  Last visit, his DBS was reset more similar to the settings he had prior to coming here, just with an increased voltage.  About 2 weeks after our last  visit, the patient decided to go back on the Artane.  The combination of the Artane and the DBS changes helped significantly.  He does ask if I can slightly increased voltage on the left hand, as he has some tremor at night that is bothersome.  Otherwise, he is doing well.  He feels that his balance has been great.  02/19/13 update:  This patient is accompanied in the office by his child who supplements the history.  The pt has a hx of PD.  He has been tremor free and is very happy about that.  He has had some increased balance loss; the last fall was a few months ago but he hasn't gotten hurt.  Was considering hernia surgery.  The records that were made available to me were reviewed.  He is holding on that for now but plans to have it done before end of year.  No hallucinations.  He is still having some acting out of the dreams, despite clonazepam.  06/19/13 update:  Patient is returning to followup regarding his Parkinson's disease.  I had the opportunity to review records since last visit.  He went to the emergency room on  05/12/2013 with chest pain and shortness of breath.  He had been fishing and had missed several doses of Lasix.  He had been eating seafood.  He ended up with an acute exacerbation of his chronic congestive heart failure.  Once he was diuresed and back on his medications, he is feeling better.  He just had a nuclear medicine study done on 06/17/2013 and the ejection fraction on this looked better than on his echocardiogram.  The left ventricular ejection fraction was 41%.  Prior to that, there was some concern that his left ventricular ejection fraction had dropped so much that he may need an ICD.  In terms of Parkinson's disease, the patient states that he has been doing very well in terms of tremor, but his walking really has deteriorated.  He describes a festinating gait, with much more shuffling.  He fell walking over his dog gate but otherwise has not had falls.  He went fishing  yesterday and states that he "stumbled all day long."  No hallucinations.  He has been trying to be more faithful with his CPAP. He remains on Mirapex 0.5 mg twice a day, Artane 2 mg twice a day.  10/23/13 update:  The patient returns today to the clinic, accompanied by his wife who supplements the history.  From a Parkinson's standpoint, the patient states that he has been doing very well.  No tremor.  He started on levodopa last visit and states that he has had no falls and overall stumbling has been much better.  He states that the only time that he stumbles is if he is inside of his fishing boat, and states that that is really because it is small and unstable.  He is taking his levodopa in the morning, after lunch and bedtime.  He has had no hallucinations.  His wife states that he is still draining and acting out the dreams and even fell out of bed a few days ago.  However, he is taking his clonazepam about 10 PM but doesn't go to bed until 1 AM.  He is not using his CPAP.  Is having significant constipation.   I reviewed his cardiology records since last visit.  He did have a heart catheterization and is scheduled to have an ICD placed on November 5.  02/23/14 update:  Pt returns for follow up accompanied by his wife who supplements the history.  The records that were made available to me were reviewed since last visit.  He had an ICD placed on 11/15/13.  He is doing well.  Taking carbidopa/levodopa 25/100 three times a day and remains on artane as well as mirapex 0.5 mg bid.  Had a fall up the stairs.  Wife describes festinating gate and pt thinks that levodopa contributes.  He also doesn't feel good much of the time and thinks that is from levodopa.  Admits, however, that when BS under good control, he feels good.  Has started seeing endocrinology and started on new meds.  He has klonopin for RBD.  Still vivid dreams but no falling out bed (in recliner now).  Having some word finding trouble and memory  doesn't seem clear.  Also, put back tailgate down on car and came down on generator and wants me to look at that.    07/01/14 update:  The patient follows up today regarding his Parkinson's disease.  He is accompanied by his wife who supplements the history.  Last visit, the patient was complaining about memory change which  I thought was likely multifactorial and due to medication such as Artane, Mirapex and Ultram, which he was taking every 3 hours.  The patient, however, thought that it was from levodopa and so we held it and the patient reported that he felt better and therefore continued to stay off of the medication.  He reports that falls are better after d/c levodopa but wife reports still falls.  Pt states that he only fell in shower after he d/c levodopa.  His wife does admit that she thinks that levodopa was causing loss of balance.  He is having more tremor over the last month on the L hand.   He remains on clonazepam 0.5 mg, 1-1/2 tablets at night for REM behavior disorder.  I reviewed records since last visit.  He has seen Dr. Dwyane Dee multiple times in regards to his uncontrolled diabetes.  He started on insulin on May 22 2014.  He states that he is doing well with injecting himself.  Having a lot of constipation.  Has the rancho recipe.    11/03/14 update:  The patient is following up today, accompanied by his wife who supplements the history.  Records were reviewed since our last visit.  The patient is on pramipexole 0.5 mg twice a day and Artane 2 mg twice a day.  He has not had any hallucinations.  Had a fall the other day walking up stairs carrying something and fell backward.  Hit his back.  No LOC.  Also fell backward in the summer around the pool.  [place] one time out of his boat.  Doesn't want to use a walker. He was in the emergency room recently with a rash that was felt secondary to insect bites that he got in the woods.  He also recently had a colonoscopy.  His diabetes is under better  control and his last A1c was 7.2.  States that he now has an insulin pump.  He remains on clonazepam 0.5 mg, 1-1/2 tablets at night for REM behavior disorder.  More back pain lately and will start injections for the back which have helped previously  02/04/15 update:  The patient presents today, accompanied by his wife who supplements the history.  I have reviewed prior records made available to me.  He has established a new primary care doctor in Dr. Damita Dunnings.  The patient remains on Artane, 2 mg twice a day as well as pramipexole 0.5 mg twice a day.  Overall, he has been doing fairly well.  He occasionally still has some tremor.  He had 2 falls since our last visit; he states that he was pushing a gate open and it came back and hit him.  He was also carrying wood the other day and fell forward with wood.  No hallucinations.  No lightheadedness or near syncope.  He remains on clonazepam 0.5 mg, 1-1/2 tablets at night for REM behavior disorder.  [facility] at [facility].    06/08/15 update:  This patient is accompanied in the office by his spouse who supplements the history.  The patient remains on Artane, 2 mg twice a day as well as pramipexole 0.5 mg twice a day.  He tripped over the curb at [employer].  He tripped over the step on his deck.   He remains on clonazepam 0.5 mg, 1-1/2 tablets at night for REM behavior disorder.  He is still having trouble with that.  He fell out of bed the other night.  Wife asks about bed rails.   He felt that [facility] helped.  Is looking forward to getting in his pool.    09/09/15 update:  The patient follows up today, accompanied by his wife who supplements the history.  He is on pramipexole 0.5 g twice a day and trihexyphenidyl 2 mg twice a day.  Didn't go up to the 1.5 mg of pramipexole.   He is on clonazepam 0.5 mg, 1-1/2 tablets at night for REM behavior disorder.  His biggest issue is RLS at bedtime.  It is preventing him from going to sleep.    He denies any hallucinations.  He denies cognitive change.  He continues to struggle with balance and has fallen/stumbled a few times.  He has fallen in the boat.  Wife describes festinating gait.  Wife also describes crying frequently.  Denies depression. He has his battery changed on 06/10/15  12/18/15 update:  Pt is following up today, accompanied by his wife who supplements the history.  Last visit, I changed him from pramipexole, 0.5 mg twice per day to Mirapex ER, 1.5 mg once per day.  He states that it is very expensive.  He remains on trihexyphenidyl, 2 mg twice per day.  He is also on clonazepam 0.5 mg, 1-1/2 tablets at night for REM behavior disorder. It turns out he was taking this at 6pm.   I reviewed records since last visit.  He reports that his pharmacist to come off his Aldactone because of an interaction with his Parkinson's medicine.  I am unaware of any interaction with his current medications.  Been having issues with constipation, which has been treated by Dr. Damita Dunnings.  [facility] is in [facility].  When asked about falls, his wife states that he falls all the time but he is more conscious of festinating gait now that he is in PT.  Not gotten hurt with falls.    07/13/16 update:  Pt f/u today for PD, accompanied by his wife, who supplements the history.  The records that were made available to me were reviewed since last visit.  On pramipexole ER 1.5 mg daily and artane 2 mg bid.  Was on klonopin for RBD but he d/c the medication.  He thought that it caused insomnia.  He is still acting out the dreams.  He won't use his CPAP machine.  States that he is afraid of it malfunctioning.  Often will sleep in the recliner.  Having more back pain.  Having injections and 3rd is next week at [facility].    Wearing off:  No.  How long before next dose:  n/a Falls:   Yes.  , 12 months but some of these were falls out of bed in RBD N/V:  No. Hallucinations:  Yes.   (none visual but few auditory and has  gotten out of bed to see who was there)  visual distortions: No. Lightheaded:  No.  Syncope: No. Dyskinesia:  No.   12/15/16 update: Patient seen today in follow-up for his Parkinson's disease.  He is accompanied by his wife who supplements the history.  He remains on pramipexole ER, 1.5 mg daily.  He is also on trihexyphenidyl, 2 mg.  He was taking bid but he looked at his bottle and realized he was supposed to be taking it qd and just started doing that.  He has not had any hallucinations, except a few arising in the middle of the night out of a vivid dream.  No compulsive  behaviors.  I have reviewed records made available to me from his other providers, including his endocrinologist, cardiologist and primary care physician.  Off of klonopin.  "If I take it I sleep the entire next day."  He is having vivid dreams.  States that he has not fallen that much until last night.  Wife describes festinating gait.  Having more trouble in the shower.  A shower chair will not fit in his shower.  They have a small corner seat, but he states that he cannot wash his feet if he sits in it.  It is too slippery.  He is a currently attending [facility] through [facility] for his low back pain.  He does like that.  PREVIOUS MEDICATIONS: Mirapex and artane, levodopa (made "too loose")  ALLERGIES:   Allergies  Allergen Reactions  . Penicillins Rash and Other (See Comments)    WEARS ALLERGY BRACELET Because of a history of documented adverse serious drug reaction;Medi Alert bracelet  is recommended Has patient had a PCN reaction causing immediate rash, facial/tongue/throat swelling, SOB or lightheadedness with hypotension: Yes Has patient had a PCN reaction causing severe rash involving mucus membranes or skin necrosis: unknown Has patient had a PCN reaction that required hospitalization NO Has patient had a PCN reaction occurring within the last 10 years: NO If all of   . Watermelon [Citrullus Vulgaris]      Tickle in throat and cough    CURRENT MEDICATIONS:   Allergies as of 12/15/2016      Reactions   Penicillins Rash, Other (See Comments)   WEARS ALLERGY BRACELET Because of a history of documented adverse serious drug reaction;Medi Alert bracelet  is recommended Has patient had a PCN reaction causing immediate rash, facial/tongue/throat swelling, SOB or lightheadedness with hypotension: Yes Has patient had a PCN reaction causing severe rash involving mucus membranes or skin necrosis: unknown Has patient had a PCN reaction that required hospitalization NO Has patient had a PCN reaction occurring within the last 10 years: NO If all of    Watermelon [citrullus Vulgaris]    Tickle in throat and cough      Medication List        Accurate as of 12/15/16  1:23 PM. Always use your most recent med list.          acetaminophen 325 MG tablet Commonly known as:  TYLENOL Take 1-2 tablets (325-650 mg total) by mouth every 4 (four) hours as needed for mild pain.   albuterol 108 (90 Base) MCG/ACT inhaler Commonly known as:  PROVENTIL HFA;VENTOLIN HFA Inhale 2 puffs into the lungs every 6 (six) hours as needed for wheezing or shortness of breath.   aspirin EC 81 MG tablet Take 81 mg by mouth daily.   betamethasone dipropionate 0.05 % cream Commonly known as:  DIPROLENE Apply topically 2 (two) times daily as needed.   Fluticasone-Salmeterol 250-50 MCG/DOSE Aepb Commonly known as:  ADVAIR Inhale 1 puff into the lungs 2 (two) times daily.   furosemide 80 MG tablet Commonly known as:  LASIX Take 1.5 tablets (120 mg total) by mouth daily.   HUMALOG 100 UNIT/ML injection Generic drug:  insulin lispro USE MAX 56 UNITS PER DAY   WITH V-GO PUMP   ibuprofen 200 MG tablet Commonly known as:  ADVIL,MOTRIN Take 800 mg by mouth every 6 (six) hours as needed (For pain.).   levothyroxine 125 MCG tablet Commonly known as:  SYNTHROID, LEVOTHROID TAKE 1 TABLET EVERY DAY DAILY BEFORE BREAKFAST  lisinopril 2.5 MG tablet Commonly known as:  PRINIVIL,ZESTRIL Take 1 tablet (2.5 mg total) by mouth daily.   metFORMIN 1000 MG tablet Commonly known as:  GLUCOPHAGE TAKE 1 TABLET (1,000 MG TOTAL) BY MOUTH 2 (TWO) TIMES DAILY WITH A MEAL.   metoprolol succinate 50 MG 24 hr tablet Commonly known as:  TOPROL-XL TAKE 1 TABLET BY MOUTH DAILY. TAKE WITH OR IMMEDIATELY FOLLOWING A MEAL.   multivitamin with minerals Tabs tablet Take 1 tablet by mouth daily.   nitroGLYCERIN 0.4 MG SL tablet Commonly known as:  NITROSTAT Place 1 tablet (0.4 mg total) under the tongue every 5 (five) minutes as needed for chest pain.   NOVOTWIST 32G X 5 MM Misc Generic drug:  Insulin Pen Needle USE AS DIRECTED EVERY DAY   ONETOUCH DELICA LANCETS 60A Misc Use to check blood sugar 2 times per day dx code E11.49   ONETOUCH VERIO test strip Generic drug:  glucose blood USE AS DIRECTED TO CHECK BLOOD SUGAR TWICE A DAY   potassium chloride SA 20 MEQ tablet Commonly known as:  KLOR-CON M10 Take 2 tablets (40 mEq total) by mouth daily.   Pramipexole Dihydrochloride 1.5 MG Tb24 TAKE 1 TABLET DAILY   rosuvastatin 20 MG tablet Commonly known as:  CRESTOR Take 1 tablet (20 mg total) daily by mouth.   traMADol 50 MG tablet Commonly known as:  ULTRAM TAKE 1 TABLET BY MOUTH TWICE A DAY AS NEEDED   travoprost (benzalkonium) 0.004 % ophthalmic solution Commonly known as:  TRAVATAN Place 1 drop into the right eye at bedtime.   trihexyphenidyl 2 MG tablet Commonly known as:  ARTANE Take 1 tablet (2 mg total) by mouth daily.   V-GO 30 Kit Use 1 pod daily with insulin   VICTOZA 18 MG/3ML Sopn Generic drug:  liraglutide INJECT 1.8MG ONCE DAILY   vitamin B-12 1000 MCG tablet Commonly known as:  CYANOCOBALAMIN Take 1,000 mcg by mouth daily.        PAST MEDICAL HISTORY:   Past Medical History:  Diagnosis Date  . AICD (automatic cardioverter/defibrillator) present    Dr Lovena Le office visit yearly   . Arthritis    cane  . Asthma   . Benign neoplasm of colon   . CAD (coronary artery disease)   . Cardiomyopathy   . Complication of anesthesia    pt states that he got a rash  . Constipation   . 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 DM (diabetes mellitus) ([place])    TYPE 2   . GERD (gastroesophageal reflux disease)   . Glaucoma    right eye  . HLD (hyperlipidemia)   . HTN (hypertension)    pt denies 08/19/12  . Hyperplasia, prostate   . Hyperthyroidism    thyroid lobectomy  . MI (myocardial infarction) [facility])    Dr Stanford Breed 2000, x3vessels bypass  . Nephrolithiasis   . OSA (obstructive sleep apnea)    AHI-28,on CPAP, noncompliant with CPAP  . Parkinson disease ([place])    1999  . PONV (postoperative nausea and vomiting)   . Restless legs   . Shortness of breath    Hx: of at all times  . Sleep apnea    does not use CPAP  . Sleep apnea, organic   . UTI (lower urinary tract infection) 09/15/12   Klebsiella  . Ventral hernia     PAST SURGICAL HISTORY:   Past Surgical History:  Procedure Laterality Date  . 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 CARDIAC CATHETERIZATION    . CATARACT EXTRACTION W/ INTRAOCULAR LENS IMPLANT     Hx: of right eye  . COLONOSCOPY N/A 10/13/2014   Procedure: COLONOSCOPY;  Surgeon: Jerene Bears, MD;  Location: [facility] END[facility]COPY;  Service: Gastroenterology;  Laterality: N/A;  . COLONOSCOPY W/ BIOPSIES AND POLYPECTOMY     Hx: of  . CORONARY ARTERY BYPASS GRAFT  2000   [name], MD  . [place]  . DEEP BRAIN STIMULATOR PLACEMENT  2004   Right and left VIN stimulator placement (parkinsons)  . EYE SURGERY    . FINGER AMPUTATION     left pointer  . IMPLANTABLE CARDIOVERTER DEFIBRILLATOR IMPLANT N/A 11/13/2013   Procedure: IMPLANTABLE CARDIOVERTER DEFIBRILLATOR IMPLANT;  Surgeon: Evans Lance, MD;  Location: [facility] C[facility] LAB;  Service: Cardiovascular;  Laterality: N/A;  . INSERT / REPLACE /  REMOVE PACEMAKER    . LEFT AND RIGHT HEART CATHETERIZATION WITH CORONARY ANGIOGRAM N/A 09/24/2013   Procedure: LEFT AND RIGHT HEART CATHETERIZATION WITH CORONARY ANGIOGRAM;  Surgeon: Burnell Blanks, MD;  Location: [facility] CATH LAB;  Service: Cardiovascular;  Laterality: N/A;  . LITHOTRIPSY     3 different times  . MEDIAN STERNOTOMY  2000  . SUBTHALAMIC STIMULATOR BATTERY REPLACEMENT N/A 09/05/2012   Procedure: Deep brain stimulator battery change;  Surgeon: Erline Levine, MD;  Location: [place] NEURO ORS;  Service: Neurosurgery;  Laterality: N/A;  Deep brain stimulator battery change  . SUBTHALAMIC STIMULATOR BATTERY REPLACEMENT N/A 06/10/2015   Procedure: Deep Brain stimulator battery change;  Surgeon: Erline Levine, MD;  Location: [place] NEURO ORS;  Service: Neurosurgery;  Laterality: N/A;  . TONSILLECTOMY      SOCIAL HISTORY:   Social History   Socioeconomic History  . Marital status: Married    Spouse name: [name]  . Number of children: 2  . Years of education: Not on file  . Highest education level: Not on file  Social Needs  . Financial resource strain: Not on file  . Food insecurity - worry: Not on file  . Food insecurity - inability: Not on file  . Transportation needs - medical: Not on file  . Transportation needs - non-medical: Not on file  Occupational History  . 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 Smoking status: Never Smoker  . Smokeless tobacco: Never Used  Substance and Sexual Activity  . Alcohol use: Yes    Comment: occasional beer  . Drug use: No  . Sexual activity: Not on file  Other Topics Concern  . Not on file  Social History Narrative   From [facility]   Retired/disability Clinical research associate   Likes to fish.     Married 1972   3 kids   [place] fan    FAMILY HISTORY:   Family Status  Relation Name Status  . Mother  Deceased       complications of surgery  . Father  Deceased  . Brother  Alive       1 brother,  1 half brother  . Sister  Alive       2 half sisters  . Child  Alive  . Unknown  (Not Specified)  . MGM  Deceased  . MGF  Deceased  . PGM  Deceased  . PGF  Deceased  . Sister  Alive  . Brother  Alive  . Child  Alive  . Child  Alive  . Neg Hx  (Not Specified)    ROS:  A complete 10 system review of systems was obtained and was unremarkable apart from what is mentioned above.  PHYSICAL EXAMINATION:    VITALS:   Vitals:   12/15/16 1303  BP: 122/70  Pulse: 82  SpO2: 90%  Weight: 260 lb (117.9 kg)  Height: 5' 8" (1.727 m)    GEN:  The patient appears stated age and is in NAD. HEENT:  Normocephalic, atraumatic.  The mucous membranes are moist. The superficial temporal arteries are without ropiness or tenderness. CV:  RRR Lungs:  CTAB Neck/HEME:  There are no carotid bruits bilaterally.   Neurological examination:  Orientation: The patient is alert and oriented x3.  Cranial nerves: There is good facial symmetry.  The visual fields are full to confrontational testing. The speech is fluent and just mildly dysarthric.  He is hypophonic. Soft palate rises symmetrically and there is no tongue deviation. Hearing is intact to conversational tone. Motor: Strength is at least antigravity x4.  Grip strength is good and equal bilaterally.  Shoulder shrug is equal and symmetric.  There is no pronator drift.  Movement examination: Tone: There is normal tone in the UE and LE bilaterally Abnormal movements: There is no tremor today Coordination:  There is slight decremation with alternation of supination/pronation of the forearm on the left and toe taps on the left. Gait and Station: The patient pushes off of the chair.  He walks well today with 4 pronged cane.  He is mildly unsteady.  LABS  Lab Results  Component Value Date   WBC 6.5 06/10/2015   HGB 12.5 (L) 06/10/2015   HCT 39.0 06/10/2015   MCV 85.5 06/10/2015   PLT 163 06/10/2015   Lab Results  Component Value Date   TSH  2.35 03/29/2016     Chemistry      Component Value Date/Time   NA 140 12/14/2016 1029   K 4.6 12/14/2016 1029   CL 97 12/14/2016 1029   CO2 27 12/14/2016 1029   BUN 30 (H) 12/14/2016 1029   CREATININE 1.85 (H) 12/14/2016 1029   CREATININE 1.53 (H) 05/22/2016 1106      Component Value Date/Time   CALCIUM 8.9 12/14/2016 1029   ALKPHOS 53 10/16/2016 1050   AST 12 10/16/2016 1050   ALT 21 10/16/2016 1050   BILITOT 0.3 10/16/2016 1050     Lab Results  Component Value Date   VITAMINB12 268 08/19/2012   Lab Results  Component Value Date   HGBA1C 7.9 (H) 10/16/2016   DBS programming was performed today, which is described in more detail on a separate programming procedural notes.   ASSESSMENT/PLAN:  1.  Idiopathic Parkinson's disease.    -He is now down to once per day Artane.  I would like to get him off of this medication altogether.  We tried this in the past unsuccessfully.  -continue Mirapex ER 1.5 mg daily.  Told him that we may need to try and drop the dosage of this in the future for the same reason as the trihexyphenidyl, but for now we decided to continue him on that.  He does not think levodopa worked in the past and thinks it made him "too loose" and I told him we may need to retry it in the future.  Will continue to watch pramipexole dosage, esp given kidney disease.  However, kidney disease has been improved with decreasing his Zaroxolyn and may have been due to  over diuresis.  -His DBS battery was last changed on 09/05/2012 and on 06/10/15.    -Told him that he really needs to use his walker at all times for the festinating gait.  This is really the reason he is falling.  We have discussed this the last several visits.  He states that he is more open to this now.  Talked to him about the appropriate type of walker.  -Concerned about safety within the shower.  Unfortunately, I cannot send out and occupational therapy home safety evaluation, primarily because he is doing  outpatient physical therapy for his back.  We talked about various solutions, but he really did not think that they were going to work for him.  I am getting my Education officer, museum involved with this. 2.  RBD  -He stated that clonazepam really just makes him too sleepy the next day and give him a hangover effect.  Talked about safety in the bedroom.  Talked about using bed rails. 3.  Pseudobulbar affect (crying)  -Talked to him about the nature and pathophysiology.  Really does not want any medication for this.   4.  OSAS, noncompliant with CPAP.  -Unfortunately, he has been unable to tolerate CPAP.  Talked about dental appliances, which he does not wish to try either. 5.  Sialorrhea  -This is associated with Parkinson's disease.  He may benefit from Myobloc in the future. 6.  Dysphagia.  -Overall, this is fairly mild but I will give an eye on this and consider a MBE if needed. 7.  CHF  -ICD placed on 11/15/2013 8.  Constipation  -copy of rancho recipe given although he still has the problem.  Dr. Damita Dunnings managing.  Better with Miralax and dates. 9.  Uncontrolled DM  -Doing much better with insulin pump 10.  Follow up is anticipated in the next few months, sooner should new neurologic issues arise.  Much greater than 50% of this visit was spent in counseling and coordinating care.  Total face to face time:  25 min not including DBS time

## 2016-12-15 ENCOUNTER — Ambulatory Visit (INDEPENDENT_AMBULATORY_CARE_PROVIDER_SITE_OTHER): Payer: Medicare Other | Admitting: Neurology

## 2016-12-15 ENCOUNTER — Encounter: Payer: Self-pay | Admitting: Neurology

## 2016-12-15 VITALS — BP 122/70 | HR 82 | Ht 68.0 in | Wt 260.0 lb

## 2016-12-15 DIAGNOSIS — G20 Parkinson's disease: Secondary | ICD-10-CM | POA: Diagnosis not present

## 2016-12-15 DIAGNOSIS — M545 Low back pain: Secondary | ICD-10-CM | POA: Diagnosis not present

## 2016-12-15 DIAGNOSIS — N289 Disorder of kidney and ureter, unspecified: Secondary | ICD-10-CM | POA: Diagnosis not present

## 2016-12-15 DIAGNOSIS — M79605 Pain in left leg: Secondary | ICD-10-CM | POA: Diagnosis not present

## 2016-12-15 DIAGNOSIS — I255 Ischemic cardiomyopathy: Secondary | ICD-10-CM

## 2016-12-15 DIAGNOSIS — M79604 Pain in right leg: Secondary | ICD-10-CM | POA: Diagnosis not present

## 2016-12-15 NOTE — Procedures (Signed)
DBS Programming was performed.    Total time spent programming was 10 minutes.  Device was confirmed to be on.  Soft start was confirmed to be on.  Impedences were checked and were within normal limits.  Battery was checked and was determined to be functionally normally and not near end of life (2.87).    Detailed analysis on separate neurophysiologic worksheet.    Final settings were as follows:  Left brain electrode:     1-2+           ; Amplitude  4.0   V   ; Pulse width 90 microseconds;   Frequency   160   Hz.  Right brain electrode:     4-7+          ; Amplitude   4.3  V ;  Pulse width 90  microseconds;  Frequency   160    Hz.

## 2016-12-19 DIAGNOSIS — M545 Low back pain: Secondary | ICD-10-CM | POA: Diagnosis not present

## 2016-12-19 DIAGNOSIS — M79604 Pain in right leg: Secondary | ICD-10-CM | POA: Diagnosis not present

## 2016-12-19 DIAGNOSIS — M79605 Pain in left leg: Secondary | ICD-10-CM | POA: Diagnosis not present

## 2016-12-20 ENCOUNTER — Ambulatory Visit: Payer: Medicare Other | Admitting: Student

## 2016-12-20 ENCOUNTER — Other Ambulatory Visit: Payer: Self-pay

## 2016-12-20 MED ORDER — METFORMIN HCL 1000 MG PO TABS: ORAL_TABLET | ORAL | 3 refills | Status: DC

## 2016-12-21 DIAGNOSIS — M79605 Pain in left leg: Secondary | ICD-10-CM | POA: Diagnosis not present

## 2016-12-21 DIAGNOSIS — M79604 Pain in right leg: Secondary | ICD-10-CM | POA: Diagnosis not present

## 2016-12-21 DIAGNOSIS — M545 Low back pain: Secondary | ICD-10-CM | POA: Diagnosis not present

## 2016-12-22 ENCOUNTER — Ambulatory Visit (INDEPENDENT_AMBULATORY_CARE_PROVIDER_SITE_OTHER): Payer: Medicare Other | Admitting: Physician Assistant

## 2016-12-22 ENCOUNTER — Encounter: Payer: Self-pay | Admitting: Physician Assistant

## 2016-12-22 VITALS — BP 131/82 | HR 76 | Ht 68.0 in | Wt 259.0 lb

## 2016-12-22 DIAGNOSIS — N200 Calculus of kidney: Secondary | ICD-10-CM | POA: Diagnosis not present

## 2016-12-22 DIAGNOSIS — I255 Ischemic cardiomyopathy: Secondary | ICD-10-CM

## 2016-12-22 DIAGNOSIS — I5022 Chronic systolic (congestive) heart failure: Secondary | ICD-10-CM | POA: Diagnosis not present

## 2016-12-22 DIAGNOSIS — Z79899 Other long term (current) drug therapy: Secondary | ICD-10-CM | POA: Diagnosis not present

## 2016-12-22 MED ORDER — METOLAZONE 2.5 MG PO TABS: 2.5000 mg | ORAL_TABLET | Freq: Every day | ORAL | 1 refills | Status: DC

## 2016-12-22 NOTE — Progress Notes (Signed)
Cardiology Office Note   Date:  12/22/2016   ID:  Henry [name], DOB May 18, 1950, MRN [id number]  PCP:  Tonia Ghent, MD  Cardiologist: Dr. Stanford Breed, 12/07/2016 Rosaria Ferries, PA-C   Chief Complaint  Patient presents with  . Follow-up    pt denied chest pain    History of Present Illness: Henry [name] is a 66 y.o. male with a history of CABG 2000, DM, HTN, HLD, Parkinson's dz, mild carotid dz 2005, cath 2015 w/ SVG-PDA ok, SVG-D1 ok, LIMA-LAD ok>>med rx, ICM w/ EF 30-35% echo 2015, COPD, morbid obesity, S-CHF s/p MDT ICD  11/19 office visit, metolazone 3 times a week, BP 96/54, weight 254 pounds.  Renal function checked and patient felt to be over diuresed, metolazone is now as needed only, BUN 55, creatinine 2.27. 12/06 recheck BUN 30 creatinine 1.85  Henry [name] presents for cardiology follow up.  His weight has been stable. He tends to snack during the night, had only a glass of milk last night.   He is compliant with the Lasix 120 mg. He has not had to use the metolazone prn. His home weight today was 254.6  He adds salt to food. Does not watch the sodium in foods.  He likes to eat all lives and drink pickle juice.  He has never thought about the association between fluid intake, sodium, and edema.  He knows that he does not want to end up in the hospital.  His wife mentions that the last time he was hospitalized, it was after he went on vacation and forgot his medications.  He has not had chest pain. He has had some L shoulder pain, thinks it is MS pain.  He is doing water exercise as therapy for his back, no chest pain with this.   He has a lot of problems with back pain. He has had injections.   He has not had any palpitations.  He has not had any shocks from his ICD.   Past Medical History:  Diagnosis Date  . AICD (automatic cardioverter/defibrillator) present    Dr Lovena Le office visit yearly, MDT   . Arthritis    cane  . Asthma   . Benign  neoplasm of colon   . CAD (coronary artery disease)   . Cardiomyopathy   . Complication of anesthesia    pt states that he got a rash  . Constipation   . 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 DM (diabetes mellitus) ([place])    TYPE 2   . GERD (gastroesophageal reflux disease)   . Glaucoma    right eye  . HLD (hyperlipidemia)   . HTN (hypertension)    pt denies 08/19/12  . Hyperplasia, prostate   . Hyperthyroidism    thyroid lobectomy  . MI (myocardial infarction) [facility])    Dr Stanford Breed 2000, x3vessels bypass  . Nephrolithiasis   . OSA (obstructive sleep apnea)    AHI-28,on CPAP, noncompliant with CPAP  . Parkinson disease ([place])    1999  . PONV (postoperative nausea and vomiting)   . Restless legs   . Shortness of breath    Hx: of at all times  . Sleep apnea    does not use CPAP  . Sleep apnea, organic   . UTI (lower urinary tract infection) 09/15/12   Klebsiella  . Ventral hernia     Past Surgical History:  Procedure Laterality Date  . 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 CARDIAC CATHETERIZATION    . CATARACT EXTRACTION W/ INTRAOCULAR LENS IMPLANT     Hx: of right eye  . COLONOSCOPY N/A 10/13/2014   Procedure: COLONOSCOPY;  Surgeon: Jerene Bears, MD;  Location: [facility] END[facility]COPY;  Service: Gastroenterology;  Laterality: N/A;  . COLONOSCOPY W/ BIOPSIES AND POLYPECTOMY     Hx: of  . CORONARY ARTERY BYPASS GRAFT  2000   [name], MD  . [place]  . DEEP BRAIN STIMULATOR PLACEMENT  2004   Right and left VIN stimulator placement (parkinsons)  . EYE SURGERY    . FINGER AMPUTATION     left pointer  . IMPLANTABLE CARDIOVERTER DEFIBRILLATOR IMPLANT N/A 11/13/2013   Procedure: IMPLANTABLE CARDIOVERTER DEFIBRILLATOR IMPLANT;  Surgeon: Evans Lance, MD;  Location: [facility] C[facility] LAB;  Service: Cardiovascular;  Laterality: N/A;  . INSERT / REPLACE / REMOVE PACEMAKER    . LEFT AND RIGHT HEART CATHETERIZATION WITH CORONARY  ANGIOGRAM N/A 09/24/2013   Procedure: LEFT AND RIGHT HEART CATHETERIZATION WITH CORONARY ANGIOGRAM;  Surgeon: Burnell Blanks, MD;  Location: [facility] CATH LAB;  Service: Cardiovascular;  Laterality: N/A;  . LITHOTRIPSY     3 different times  . MEDIAN STERNOTOMY  2000  . SUBTHALAMIC STIMULATOR BATTERY REPLACEMENT N/A 09/05/2012   Procedure: Deep brain stimulator battery change;  Surgeon: Erline Levine, MD;  Location: [place] NEURO ORS;  Service: Neurosurgery;  Laterality: N/A;  Deep brain stimulator battery change  . SUBTHALAMIC STIMULATOR BATTERY REPLACEMENT N/A 06/10/2015   Procedure: Deep Brain stimulator battery change;  Surgeon: Erline Levine, MD;  Location: [place] NEURO ORS;  Service: Neurosurgery;  Laterality: N/A;  . TONSILLECTOMY      Current Outpatient Medications  Medication Sig Dispense Refill  . acetaminophen (TYLENOL) 325 MG tablet Take 1-2 tablets (325-650 mg total) by mouth every 4 (four) hours as needed for mild pain.    Marland Kitchen albuterol (PROVENTIL HFA;VENTOLIN HFA) 108 (90 Base) MCG/ACT inhaler Inhale 2 puffs into the lungs every 6 (six) hours as needed for wheezing or shortness of breath. 18 g 5  . aspirin EC 81 MG tablet Take 81 mg by mouth daily.    . betamethasone dipropionate (DIPROLENE) 0.05 % cream Apply topically 2 (two) times daily as needed. 45 g 1  . Fluticasone-Salmeterol (ADVAIR) 250-50 MCG/DOSE AEPB Inhale 1 puff into the lungs 2 (two) times daily. 60 each 5  . furosemide (LASIX) 80 MG tablet Take 1.5 tablets (120 mg total) by mouth daily. 135 tablet 0  . HUMALOG 100 UNIT/ML injection USE MAX 56 UNITS PER DAY   WITH V-GO PUMP 60 mL 1  . ibuprofen (ADVIL,MOTRIN) 200 MG tablet Take 800 mg by mouth every 6 (six) hours as needed (For pain.).     . Insulin Disposable Pump (V-GO 30) KIT Use 1 pod daily with insulin 90 kit 3  . levothyroxine (SYNTHROID, LEVOTHROID) 125 MCG tablet TAKE 1 TABLET EVERY DAY DAILY BEFORE BREAKFAST 90 tablet 1  . lisinopril (PRINIVIL,ZESTRIL) 2.5 MG tablet  Take 1 tablet (2.5 mg total) by mouth daily. 30 tablet 12  . metFORMIN (GLUCOPHAGE) 1000 MG tablet Take 1,000 mg by mouth daily with breakfast.    . metoprolol succinate (TOPROL-XL) 50 MG 24 hr tablet TAKE 1 TABLET BY MOUTH DAILY. TAKE WITH OR IMMEDIATELY FOLLOWING A MEAL. 90 tablet 0  . Multiple Vitamin (MULTIVITAMIN WITH MINERALS) TABS tablet Take 1 tablet by mouth daily.     Marland Kitchen NOVOTWIST  32G X 5 MM MISC USE AS DIRECTED EVERY DAY 100 each 2  . ONETOUCH DELICA LANCETS 26S MISC Use to check blood sugar 2 times per day dx code E11.49 100 each 5  . ONETOUCH VERIO test strip USE AS DIRECTED TO CHECK BLOOD SUGAR TWICE A DAY 100 each 5  . potassium chloride SA (K-DUR,KLOR-CON) 20 MEQ tablet Take 2 tablets (40 mEq total) by mouth daily. 180 tablet 3  . Pramipexole Dihydrochloride 1.5 MG TB24 TAKE 1 TABLET DAILY 90 tablet 0  . rosuvastatin (CRESTOR) 20 MG tablet Take 1 tablet (20 mg total) daily by mouth. 90 tablet 3  . traMADol (ULTRAM) 50 MG tablet TAKE 1 TABLET BY MOUTH TWICE A DAY AS NEEDED 60 tablet 1  . travoprost, benzalkonium, (TRAVATAN) 0.004 % ophthalmic solution Place 1 drop into the right eye at bedtime.     . trihexyphenidyl (ARTANE) 2 MG tablet Take 1 tablet (2 mg total) by mouth daily. 90 tablet 1  . VICTOZA 18 MG/3ML SOPN INJECT 1.8MG ONCE DAILY 27 mL 1  . vitamin B-12 (CYANOCOBALAMIN) 1000 MCG tablet Take 1,000 mcg by mouth daily.    . nitroGLYCERIN (NITROSTAT) 0.4 MG SL tablet Place 1 tablet (0.4 mg total) under the tongue every 5 (five) minutes as needed for chest pain. 25 tablet 2   No current facility-administered medications for this visit.     Allergies:   Penicillins and Watermelon [citrullus vulgaris]    Social History:  The patient  reports that  has never smoked. he has never used smokeless tobacco. He reports that he drinks alcohol. He reports that he does not use drugs.   Family History:  The patient's family history includes Alcoholism in his father; Aneurysm in his  mother; Arthritis in his unknown relative; HIV/AIDS (age of onset: 62) in his brother; Healthy in his child, child, and child; Peripheral vascular disease in his unknown relative.    ROS:  Please see the history of present illness. All other systems are reviewed and negative.    PHYSICAL EXAM: VS:  BP 131/82   Pulse 76   Ht 5' 8" (1.727 m)   Wt 259 lb (117.5 kg)   SpO2 95%   BMI 39.38 kg/m  , BMI Body mass index is 39.38 kg/m. GEN: Well nourished, well developed, male in no acute distress  HEENT: normal for age  Neck: Minimal JVD, difficult to assess due to body habitus, no carotid bruit, no masses Cardiac: RRR; no murmur, no rubs, or gallops Respiratory: Decreased breath sounds bases bilaterally, normal work of breathing GI: soft, nontender, nondistended, + BS MS: no deformity or atrophy; trace lower extremity edema; distal pulses are 2+ in all 4 extremities   Skin: warm and dry, no rash Neuro:  Strength and sensation are intact Psych: euthymic mood, full affect   EKG:  EKG is ordered today.  ECHO: 08/13/2013 - Left ventricle: The cavity size was mildly dilated. Wall thickness was normal. Systolic function was moderately to severely reduced. The estimated ejection fraction was in the range of 30% to 35%. Probable akinesis and scarring of the mid-apicalanteroseptal myocardium. The apex could not be evaluated. Wll motion analysis was challenging due to body habitus. If detailed wall motion analysis and/or evaluation for LV apical thrombus is necessary, the study should be repeated with Definity microbubble contrast. - Left atrium: The atrium was moderately dilated.  Recent Labs: 03/29/2016: TSH 2.35 10/16/2016: ALT 21 12/14/2016: BUN 30; Creatinine, Ser 1.85; Potassium 4.6; Sodium 140  Lipid Panel    Component Value Date/Time   CHOL 111 10/16/2016 1050   TRIG 287.0 (H) 10/16/2016 1050   HDL 27.80 (L) 10/16/2016 1050   CHOLHDL 4 10/16/2016 1050   VLDL  57.4 (H) 10/16/2016 1050   LDLCALC 118 (H) 09/16/2015 0857   LDLDIRECT 52.0 10/16/2016 1050     Wt Readings from Last 3 Encounters:  12/22/16 259 lb (117.5 kg)  12/15/16 260 lb (117.9 kg)  12/07/16 254 lb (115.2 kg)     Other studies Reviewed: Additional studies/ records that were reviewed today include: Office notes, hospital records and testing.  ASSESSMENT AND PLAN:  1.  Chronic systolic CHF: His volume status appears to be at baseline.  We will recheck his labs today.  It is okay to take the metolazone as needed for increasing weight.  He is encouraged to check his weight every day.  He is encouraged to limit the sodium in his diet.  The importance of doing this was emphasized.  2.  Kidney stones and urologic issues: His wife made an appointment with [facility], thinking that was his kidney doctor.  He has had kidney stones in the past.  I explained the difference between the nephrologist and urologist.  If his renal function stabilizes, no additional workup.  If his renal function worsens, consider nephrology referral.   Current medicines are reviewed at length with the patient today.  The patient does not have concerns regarding medicines.  The following changes have been made:  no change  Labs/ tests ordered today include:  No orders of the defined types were placed in this encounter.    Disposition:   FU with Dr. Stanford Breed  Signed, Rosaria Ferries, PA-C  12/22/2016 12:06 PM    [place] Phone: [phone]; Fax: [phone]  This note was written with the assistance of speech recognition software. Please excuse any transcriptional errors.

## 2016-12-22 NOTE — Patient Instructions (Signed)
Medication Instructions:  NO CHANGES  If you need a refill on your cardiac medications before your next appointment, please call your pharmacy.  Labwork: BMET TODAY HERE IN OUR OFFICE AT [facility]  Follow-Up: Your physician wants you to follow-up in: [place]. You should receive a reminder letter in the mail two months in advance. If you do not receive a letter, please call our office MARCH 2019 to schedule the June 2019 follow-up appointment.   Thank you for choosing [facility] at [facility]!!

## 2016-12-23 LAB — BASIC METABOLIC PANEL
BUN / CREAT RATIO: 19 (ref 10–24)
BUN: 33 mg/dL — AB (ref 8–27)
CO2: 29 mmol/L (ref 20–29)
CREATININE: 1.75 mg/dL — AB (ref 0.76–1.27)
Calcium: 9.4 mg/dL (ref 8.6–10.2)
Chloride: 100 mmol/L (ref 96–106)
GFR calc Af Amer: 46 mL/min/{1.73_m2} — ABNORMAL LOW (ref >59)
GFR, EST NON AFRICAN AMERICAN: 40 mL/min/{1.73_m2} — AB (ref >59)
Glucose: 191 mg/dL — ABNORMAL HIGH (ref 65–99)
Potassium: 4.5 mmol/L (ref 3.5–5.2)
SODIUM: 142 mmol/L (ref 134–144)

## 2016-12-25 DIAGNOSIS — M545 Low back pain: Secondary | ICD-10-CM | POA: Diagnosis not present

## 2016-12-25 DIAGNOSIS — M79604 Pain in right leg: Secondary | ICD-10-CM | POA: Diagnosis not present

## 2016-12-25 DIAGNOSIS — M79605 Pain in left leg: Secondary | ICD-10-CM | POA: Diagnosis not present

## 2016-12-26 DIAGNOSIS — N3281 Overactive bladder: Secondary | ICD-10-CM | POA: Diagnosis not present

## 2016-12-26 DIAGNOSIS — R972 Elevated prostate specific antigen [PSA]: Secondary | ICD-10-CM | POA: Diagnosis not present

## 2016-12-28 DIAGNOSIS — M79604 Pain in right leg: Secondary | ICD-10-CM | POA: Diagnosis not present

## 2016-12-28 DIAGNOSIS — M79605 Pain in left leg: Secondary | ICD-10-CM | POA: Diagnosis not present

## 2016-12-28 DIAGNOSIS — M545 Low back pain: Secondary | ICD-10-CM | POA: Diagnosis not present

## 2017-01-05 ENCOUNTER — Other Ambulatory Visit: Payer: Self-pay | Admitting: Neurology

## 2017-01-05 DIAGNOSIS — M79605 Pain in left leg: Secondary | ICD-10-CM | POA: Diagnosis not present

## 2017-01-05 DIAGNOSIS — M545 Low back pain: Secondary | ICD-10-CM | POA: Diagnosis not present

## 2017-01-05 DIAGNOSIS — M79604 Pain in right leg: Secondary | ICD-10-CM | POA: Diagnosis not present

## 2017-01-08 DIAGNOSIS — M79605 Pain in left leg: Secondary | ICD-10-CM | POA: Diagnosis not present

## 2017-01-08 DIAGNOSIS — M545 Low back pain: Secondary | ICD-10-CM | POA: Diagnosis not present

## 2017-01-08 DIAGNOSIS — M79604 Pain in right leg: Secondary | ICD-10-CM | POA: Diagnosis not present

## 2017-01-11 ENCOUNTER — Other Ambulatory Visit: Payer: Self-pay | Admitting: Family Medicine

## 2017-01-11 DIAGNOSIS — M79604 Pain in right leg: Secondary | ICD-10-CM | POA: Diagnosis not present

## 2017-01-11 DIAGNOSIS — M79605 Pain in left leg: Secondary | ICD-10-CM | POA: Diagnosis not present

## 2017-01-11 DIAGNOSIS — M545 Low back pain: Secondary | ICD-10-CM | POA: Diagnosis not present

## 2017-01-11 NOTE — Telephone Encounter (Signed)
Electronic refill request. Tramadol Last office visit:   09/18/16 Acute Last Filled:    60 tablet 1 11/01/2016  Please advise.

## 2017-01-12 NOTE — Telephone Encounter (Signed)
Please call in.  Thanks.   

## 2017-01-12 NOTE — Telephone Encounter (Signed)
Medication phoned to pharmacy.  

## 2017-01-15 DIAGNOSIS — M79604 Pain in right leg: Secondary | ICD-10-CM | POA: Diagnosis not present

## 2017-01-15 DIAGNOSIS — M79605 Pain in left leg: Secondary | ICD-10-CM | POA: Diagnosis not present

## 2017-01-15 DIAGNOSIS — M545 Low back pain: Secondary | ICD-10-CM | POA: Diagnosis not present

## 2017-01-22 ENCOUNTER — Other Ambulatory Visit: Payer: Self-pay | Admitting: Neurology

## 2017-01-22 DIAGNOSIS — M79604 Pain in right leg: Secondary | ICD-10-CM | POA: Diagnosis not present

## 2017-01-22 DIAGNOSIS — M545 Low back pain: Secondary | ICD-10-CM | POA: Diagnosis not present

## 2017-01-22 DIAGNOSIS — M79605 Pain in left leg: Secondary | ICD-10-CM | POA: Diagnosis not present

## 2017-01-24 ENCOUNTER — Ambulatory Visit (INDEPENDENT_AMBULATORY_CARE_PROVIDER_SITE_OTHER): Payer: Medicare Other | Admitting: *Deleted

## 2017-01-24 DIAGNOSIS — I255 Ischemic cardiomyopathy: Secondary | ICD-10-CM | POA: Diagnosis not present

## 2017-01-24 DIAGNOSIS — R972 Elevated prostate specific antigen [PSA]: Secondary | ICD-10-CM | POA: Diagnosis not present

## 2017-01-24 DIAGNOSIS — N3281 Overactive bladder: Secondary | ICD-10-CM | POA: Diagnosis not present

## 2017-01-25 DIAGNOSIS — M545 Low back pain: Secondary | ICD-10-CM | POA: Diagnosis not present

## 2017-01-25 DIAGNOSIS — M79604 Pain in right leg: Secondary | ICD-10-CM | POA: Diagnosis not present

## 2017-01-25 DIAGNOSIS — M79605 Pain in left leg: Secondary | ICD-10-CM | POA: Diagnosis not present

## 2017-01-25 NOTE — Progress Notes (Signed)
Remote ICD transmission.   

## 2017-01-26 ENCOUNTER — Encounter: Payer: Self-pay | Admitting: Cardiology

## 2017-01-28 LAB — CUP PACEART REMOTE DEVICE CHECK
Battery Remaining Longevity: 99 mo
Battery Voltage: 2.99 V
Brady Statistic AP VP Percent: 0 %
Brady Statistic AS VS Percent: 99.75 %
HighPow Impedance: 66 Ohm
Implantable Lead Implant Date: 20151105
Implantable Lead Location: 753860
Implantable Pulse Generator Implant Date: 20151105
Lead Channel Impedance Value: 437 Ohm
Lead Channel Impedance Value: 456 Ohm
Lead Channel Pacing Threshold Amplitude: 1 V
Lead Channel Pacing Threshold Pulse Width: 0.4 ms
Lead Channel Pacing Threshold Pulse Width: 0.4 ms
Lead Channel Sensing Intrinsic Amplitude: 15 mV
Lead Channel Sensing Intrinsic Amplitude: 2 mV
MDC IDC LEAD IMPLANT DT: 20151105
MDC IDC LEAD LOCATION: 753859
MDC IDC MSMT LEADCHNL RA SENSING INTR AMPL: 2 mV
MDC IDC MSMT LEADCHNL RV IMPEDANCE VALUE: 399 Ohm
MDC IDC MSMT LEADCHNL RV PACING THRESHOLD AMPLITUDE: 0.375 V
MDC IDC MSMT LEADCHNL RV SENSING INTR AMPL: 15 mV
MDC IDC SESS DTM: 20190116102505
MDC IDC SET LEADCHNL RA PACING AMPLITUDE: 2 V
MDC IDC SET LEADCHNL RV PACING AMPLITUDE: 2.5 V
MDC IDC SET LEADCHNL RV PACING PULSEWIDTH: 0.4 ms
MDC IDC SET LEADCHNL RV SENSING SENSITIVITY: 0.3 mV
MDC IDC STAT BRADY AP VS PERCENT: 0.21 %
MDC IDC STAT BRADY AS VP PERCENT: 0.04 %
MDC IDC STAT BRADY RA PERCENT PACED: 0.21 %
MDC IDC STAT BRADY RV PERCENT PACED: 0.04 %

## 2017-01-29 DIAGNOSIS — M545 Low back pain: Secondary | ICD-10-CM | POA: Diagnosis not present

## 2017-01-29 DIAGNOSIS — M79605 Pain in left leg: Secondary | ICD-10-CM | POA: Diagnosis not present

## 2017-01-29 DIAGNOSIS — M79604 Pain in right leg: Secondary | ICD-10-CM | POA: Diagnosis not present

## 2017-02-01 DIAGNOSIS — M545 Low back pain: Secondary | ICD-10-CM | POA: Diagnosis not present

## 2017-02-01 DIAGNOSIS — M79604 Pain in right leg: Secondary | ICD-10-CM | POA: Diagnosis not present

## 2017-02-01 DIAGNOSIS — M79605 Pain in left leg: Secondary | ICD-10-CM | POA: Diagnosis not present

## 2017-02-05 ENCOUNTER — Other Ambulatory Visit (INDEPENDENT_AMBULATORY_CARE_PROVIDER_SITE_OTHER): Payer: Medicare Other

## 2017-02-05 ENCOUNTER — Other Ambulatory Visit: Payer: Self-pay

## 2017-02-05 DIAGNOSIS — E1165 Type 2 diabetes mellitus with hyperglycemia: Secondary | ICD-10-CM

## 2017-02-05 DIAGNOSIS — Z794 Long term (current) use of insulin: Secondary | ICD-10-CM

## 2017-02-05 DIAGNOSIS — E890 Postprocedural hypothyroidism: Secondary | ICD-10-CM | POA: Diagnosis not present

## 2017-02-05 DIAGNOSIS — M79604 Pain in right leg: Secondary | ICD-10-CM | POA: Diagnosis not present

## 2017-02-05 DIAGNOSIS — M545 Low back pain: Secondary | ICD-10-CM | POA: Diagnosis not present

## 2017-02-05 DIAGNOSIS — M79605 Pain in left leg: Secondary | ICD-10-CM | POA: Diagnosis not present

## 2017-02-05 LAB — BASIC METABOLIC PANEL
BUN: 33 mg/dL — AB (ref 6–23)
CHLORIDE: 99 meq/L (ref 96–112)
CO2: 32 mEq/L (ref 19–32)
Calcium: 9.1 mg/dL (ref 8.4–10.5)
Creatinine, Ser: 1.95 mg/dL — ABNORMAL HIGH (ref 0.40–1.50)
GFR: 36.82 mL/min — ABNORMAL LOW (ref >60.00)
Glucose, Bld: 252 mg/dL — ABNORMAL HIGH (ref 70–99)
Potassium: 4.1 mEq/L (ref 3.5–5.1)
SODIUM: 139 meq/L (ref 135–145)

## 2017-02-05 LAB — MICROALBUMIN / CREATININE URINE RATIO
CREATININE, U: 29.2 mg/dL
Microalb Creat Ratio: 2.4 mg/g (ref 0.0–30.0)
Microalb, Ur: <0.7 mg/dL (ref 0.0–1.9)

## 2017-02-05 LAB — T4, FREE: Free T4: 0.74 ng/dL (ref 0.60–1.60)

## 2017-02-05 LAB — HEMOGLOBIN A1C: HEMOGLOBIN A1C: 8.3 % — AB (ref 4.6–6.5)

## 2017-02-05 LAB — TSH: TSH: 3.62 u[IU]/mL (ref 0.35–4.50)

## 2017-02-05 MED ORDER — LEVOTHYROXINE SODIUM 125 MCG PO TABS: ORAL_TABLET | ORAL | 1 refills | Status: DC

## 2017-02-07 ENCOUNTER — Ambulatory Visit (INDEPENDENT_AMBULATORY_CARE_PROVIDER_SITE_OTHER): Payer: Medicare Other | Admitting: Endocrinology

## 2017-02-07 ENCOUNTER — Encounter: Payer: Self-pay | Admitting: Endocrinology

## 2017-02-07 VITALS — BP 123/70 | HR 81 | Temp 98.7°F | Ht 68.0 in | Wt 264.0 lb

## 2017-02-07 DIAGNOSIS — I255 Ischemic cardiomyopathy: Secondary | ICD-10-CM | POA: Diagnosis not present

## 2017-02-07 DIAGNOSIS — N183 Chronic kidney disease, stage 3 unspecified: Secondary | ICD-10-CM

## 2017-02-07 DIAGNOSIS — Z794 Long term (current) use of insulin: Secondary | ICD-10-CM

## 2017-02-07 DIAGNOSIS — E890 Postprocedural hypothyroidism: Secondary | ICD-10-CM | POA: Diagnosis not present

## 2017-02-07 DIAGNOSIS — E1165 Type 2 diabetes mellitus with hyperglycemia: Secondary | ICD-10-CM | POA: Diagnosis not present

## 2017-02-07 NOTE — Patient Instructions (Addendum)
5-6 clicks at supper  No sweet fruits or sugar desserts  Start walking  Metformin at dinner

## 2017-02-07 NOTE — Progress Notes (Signed)
Patient ID: Henry [name], male   DOB: 1950-07-07, 67 y.o.   MRN: [id number]           Reason for Appointment: Follow-up for Type 2 Diabetes   History of Present Illness:          Diagnosis: Type 2 diabetes mellitus, date of diagnosis: 2000      Past history:   Patient thinks he has been taking Amaryl for several years and probably metformin since onset also At some point he was changed from metformin to [facility] and was taking this since at least 2012 A1c had been higher in 2015 Since his A1c had been progressively higher with his regimen of Janumet and Amaryl he was started on Victoza in 01/2014 He was started on Lantus insulin in 5/16 because of persistent hyperglycemia especially fasting; was having readings as high as 293 Because of tendency to high postprandial readings and for control with Lantus he was switched to the V-go pump in  09/2014  Recent history:   INSULIN regimen: V-go-30 pump and boluses 4-8 units  Non-insulin hypoglycemic drugs the patient is taking are: Metformin 1 g twice a day, Victoza 1.8 mg daily in am   His A1c has gone 8.3, previously 7.9  Current management, blood sugar patterns and problems identified:  He has apparently much higher blood sugars recently  His wife thinks that this is because of his going overboard with diet and eating a lot of sweets, desserts and sweet foods like grapes  He did not bring his monitor for download  Apparently his blood sugars are mostly over 200 and as high as 300 at times  Is still taking only about 8 units for his evening meal despite eating larger meals and high sugars  Also not remembering to take mealtime boluses when he is snacking  Previously had gone up to the 30 unit pump but even with this now his fasting readings are consistently high history  Weight is trending higher again  He has seen the dietitian in 3/16 Not able to exercise because of back pain and musculoskeletal issues and balance         Side effects from medications have been: None Compliance with the medical regimen: Fair  Hypoglycemia:   none  Glucose monitoring:  done 1-2 times  a day         Glucometer: One Touch ultra 2 .      Blood Glucose readings by recall  Mean values apply above for all meters except median for One Touch  PRE-MEAL Fasting Lunch Dinner Bedtime Overall  Glucose range: 250   250-300   Mean/median:        Self-care: The diet that the patient has been following is: None, some fried food and cookies, mostly sugar-free         Meals: 9 am , 2-3 pm and 7 pm     Exercise: unable to do any          Dietician visit, most recent: 03/2014                Weight history:   Wt Readings from Last 3 Encounters:  02/07/17 264 lb (119.7 kg)  12/22/16 259 lb (117.5 kg)  12/15/16 260 lb (117.9 kg)    Glycemic control:   Lab Results  Component Value Date   HGBA1C 8.3 (H) 02/05/2017   HGBA1C 7.9 (H) 10/16/2016   HGBA1C 7.9 07/04/2016   Lab Results  Component Value Date  MICROALBUR <0.7 02/05/2017   LDLCALC 118 (H) 09/16/2015   CREATININE 1.95 (H) 02/05/2017   Other active problems: See review of systems      Allergies as of 02/07/2017      Reactions   Penicillins Rash, Other (See Comments)   WEARS ALLERGY BRACELET Because of a history of documented adverse serious drug reaction;Medi Alert bracelet  is recommended Has patient had a PCN reaction causing immediate rash, facial/tongue/throat swelling, SOB or lightheadedness with hypotension: Yes Has patient had a PCN reaction causing severe rash involving mucus membranes or skin necrosis: unknown Has patient had a PCN reaction that required hospitalization NO Has patient had a PCN reaction occurring within the last 10 years: NO If all of    Watermelon [citrullus Vulgaris]    Tickle in throat and cough      Medication List        Accurate as of 02/07/17  1:25 PM. Always use your most recent med list.          acetaminophen 325 MG  tablet Commonly known as:  TYLENOL Take 1-2 tablets (325-650 mg total) by mouth every 4 (four) hours as needed for mild pain.   albuterol 108 (90 Base) MCG/ACT inhaler Commonly known as:  PROVENTIL HFA;VENTOLIN HFA Inhale 2 puffs into the lungs every 6 (six) hours as needed for wheezing or shortness of breath.   aspirin EC 81 MG tablet Take 81 mg by mouth daily.   betamethasone dipropionate 0.05 % cream Commonly known as:  DIPROLENE Apply topically 2 (two) times daily as needed.   Fluticasone-Salmeterol 250-50 MCG/DOSE Aepb Commonly known as:  ADVAIR Inhale 1 puff into the lungs 2 (two) times daily.   furosemide 80 MG tablet Commonly known as:  LASIX Take 1.5 tablets (120 mg total) by mouth daily.   HUMALOG 100 UNIT/ML injection Generic drug:  insulin lispro USE MAX 56 UNITS PER DAY   WITH V-GO PUMP   ibuprofen 200 MG tablet Commonly known as:  ADVIL,MOTRIN Take 800 mg by mouth every 6 (six) hours as needed (For pain.).   levothyroxine 125 MCG tablet Commonly known as:  SYNTHROID, LEVOTHROID TAKE 1 TABLET EVERY DAY DAILY BEFORE BREAKFAST   lisinopril 2.5 MG tablet Commonly known as:  PRINIVIL,ZESTRIL Take 1 tablet (2.5 mg total) by mouth daily.   metFORMIN 1000 MG tablet Commonly known as:  GLUCOPHAGE Take 1,000 mg by mouth daily with breakfast.   metolazone 2.5 MG tablet Commonly known as:  ZAROXOLYN Take 1 tablet (2.5 mg total) by mouth daily. PRN FOR > 3 LB IN A DAY OR >5 LB IN WEEK   metoprolol succinate 50 MG 24 hr tablet Commonly known as:  TOPROL-XL TAKE 1 TABLET BY MOUTH DAILY. TAKE WITH OR IMMEDIATELY FOLLOWING A MEAL.   multivitamin with minerals Tabs tablet Take 1 tablet by mouth daily.   MYRBETRIQ 50 MG Tb24 tablet Generic drug:  mirabegron ER Take 50 mg by mouth daily.   nitroGLYCERIN 0.4 MG SL tablet Commonly known as:  NITROSTAT Place 1 tablet (0.4 mg total) under the tongue every 5 (five) minutes as needed for chest pain.   NOVOTWIST 32G X  5 MM Misc Generic drug:  Insulin Pen Needle USE AS DIRECTED EVERY DAY   ONETOUCH DELICA LANCETS 44R Misc Use to check blood sugar 2 times per day dx code E11.49   ONETOUCH VERIO test strip Generic drug:  glucose blood USE AS DIRECTED TO CHECK BLOOD SUGAR TWICE A DAY   potassium chloride  SA 20 MEQ tablet Commonly known as:  KLOR-CON M10 Take 2 tablets (40 mEq total) by mouth daily.   Pramipexole Dihydrochloride 1.5 MG Tb24 TAKE 1 TABLET DAILY   rosuvastatin 20 MG tablet Commonly known as:  CRESTOR Take 1 tablet (20 mg total) daily by mouth.   traMADol 50 MG tablet Commonly known as:  ULTRAM TAKE 1 TABLET BY MOUTH TWICE A DAY AS NEEDED   travoprost (benzalkonium) 0.004 % ophthalmic solution Commonly known as:  TRAVATAN Place 1 drop into the right eye at bedtime.   trihexyphenidyl 2 MG tablet Commonly known as:  ARTANE TAKE 1 TABLET BY MOUTH EVERY DAY   V-GO 30 Kit Use 1 pod daily with insulin   VICTOZA 18 MG/3ML Sopn Generic drug:  liraglutide INJECT 1.'8MG'$  ONCE DAILY   vitamin B-12 1000 MCG tablet Commonly known as:  CYANOCOBALAMIN Take 1,000 mcg by mouth daily.       Allergies:  Allergies  Allergen Reactions  . Penicillins Rash and Other (See Comments)    WEARS ALLERGY BRACELET Because of a history of documented adverse serious drug reaction;Medi Alert bracelet  is recommended Has patient had a PCN reaction causing immediate rash, facial/tongue/throat swelling, SOB or lightheadedness with hypotension: Yes Has patient had a PCN reaction causing severe rash involving mucus membranes or skin necrosis: unknown Has patient had a PCN reaction that required hospitalization NO Has patient had a PCN reaction occurring within the last 10 years: NO If all of   . Watermelon [Citrullus Vulgaris]     Tickle in throat and cough    Past Medical History:  Diagnosis Date  . AICD (automatic cardioverter/defibrillator) present    Dr Lovena Le office visit yearly, MDT   .  Arthritis    cane  . Asthma   . Benign neoplasm of colon   . CAD (coronary artery disease)   . Cardiomyopathy   . Complication of anesthesia    pt states that he got a rash  . Constipation   . 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 DM (diabetes mellitus) ([place])    TYPE 2   . GERD (gastroesophageal reflux disease)   . Glaucoma    right eye  . HLD (hyperlipidemia)   . HTN (hypertension)    pt denies 08/19/12  . Hyperplasia, prostate   . Hyperthyroidism    thyroid lobectomy  . MI (myocardial infarction) [facility])    Dr Stanford Breed 2000, x3vessels bypass  . Nephrolithiasis   . OSA (obstructive sleep apnea)    AHI-28,on CPAP, noncompliant with CPAP  . Parkinson disease ([place])    1999  . PONV (postoperative nausea and vomiting)   . Restless legs   . Shortness of breath    Hx: of at all times  . Sleep apnea    does not use CPAP  . Sleep apnea, organic   . UTI (lower urinary tract infection) 09/15/12   Klebsiella  . Ventral hernia     Past Surgical History:  Procedure Laterality Date  . 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 CARDIAC CATHETERIZATION    . CATARACT EXTRACTION W/ INTRAOCULAR LENS IMPLANT     Hx: of right eye  . COLONOSCOPY N/A 10/13/2014   Procedure: COLONOSCOPY;  Surgeon: Jerene Bears, MD;  Location: [facility] ENDOSCOPY;  Service: Gastroenterology;  Laterality: N/A;  . COLONOSCOPY W/ BIOPSIES AND POLYPECTOMY     Hx: of  . CORONARY ARTERY BYPASS GRAFT  [place], [place]  . [place]  . DEEP BRAIN STIMULATOR PLACEMENT  2004   Right and left VIN stimulator placement (parkinsons)  . EYE SURGERY    . FINGER AMPUTATION     left pointer  . IMPLANTABLE CARDIOVERTER DEFIBRILLATOR IMPLANT N/A 11/13/2013   Procedure: IMPLANTABLE CARDIOVERTER DEFIBRILLATOR IMPLANT;  Surgeon: Evans Lance, MD;  Location: [facility] CATH LAB;  Service: Cardiovascular;  Laterality: N/A;  . INSERT / REPLACE / REMOVE PACEMAKER    . LEFT AND  RIGHT HEART CATHETERIZATION WITH CORONARY ANGIOGRAM N/A 09/24/2013   Procedure: LEFT AND RIGHT HEART CATHETERIZATION WITH CORONARY ANGIOGRAM;  Surgeon: Burnell Blanks, MD;  Location: [facility] C[facility] LAB;  Service: Cardiovascular;  Laterality: N/A;  . LITHOTRIPSY     3 different times  . MEDIAN STERNOTOMY  2000  . SUBTHALAMIC STIMULATOR BATTERY REPLACEMENT N/A 09/05/2012   Procedure: Deep brain stimulator battery change;  Surgeon: Erline Levine, MD;  Location: [place] NEURO ORS;  Service: Neurosurgery;  Laterality: N/A;  Deep brain stimulator battery change  . SUBTHALAMIC STIMULATOR BATTERY REPLACEMENT N/A 06/10/2015   Procedure: Deep Brain stimulator battery change;  Surgeon: Erline Levine, MD;  Location: [place] NEURO ORS;  Service: Neurosurgery;  Laterality: N/A;  . TONSILLECTOMY      Family History  Problem Relation Age of Onset  . Aneurysm Mother   . Alcoholism Father   . HIV/AIDS Brother 61       AIDS  . Healthy Child   . Peripheral vascular disease Unknown   . Arthritis Unknown   . Healthy Child   . Healthy Child   . Diabetes Neg Hx   . Heart disease Neg Hx   . Colon cancer Neg Hx   . Prostate cancer Neg Hx     Social History:  reports that  has never smoked. he has never used smokeless tobacco. He reports that he drinks alcohol. He reports that he does not use drugs.    Review of Systems     Most recent eye exam was done in 2/18    HYPERLIPIDEMIA:  He is on Crestor with good control, has low HDL Triglycerides are better with starting back on Lovaza in October       Lab Results  Component Value Date   CHOL 111 10/16/2016   CHOL 132 12/06/2015   CHOL 182 09/16/2015   Lab Results  Component Value Date   HDL 27.80 (L) 10/16/2016   HDL 30.20 (L) 12/06/2015   HDL 31.30 (L) 09/16/2015   Lab Results  Component Value Date   LDLCALC 118 (H) 09/16/2015   LDLCALC 126 (H) 04/08/2013   LDLCALC 51 02/28/2012   Lab Results  Component Value Date   TRIG 287.0 (H) 10/16/2016    TRIG 217.0 (H) 12/06/2015   TRIG 165.0 (H) 09/16/2015   Lab Results  Component Value Date   CHOLHDL 4 10/16/2016   CHOLHDL 4 12/06/2015   CHOLHDL 6 09/16/2015   Lab Results  Component Value Date   LDLDIRECT 52.0 10/16/2016   LDLDIRECT 69.0 12/06/2015   LDLDIRECT 86.0 05/19/2014                  Thyroid:   He has had hypothyroidism following radioactive iodine treatment for hyperthyroidism in 2013  TSH is usually normal with  dosage of 125 g   Lab Results  Component Value Date   TSH 3.62 02/05/2017   TSH 2.35 03/29/2016   TSH 3.58 09/16/2015  FREET4 0.74 02/05/2017   FREET4 0.82 08/20/2014   FREET4 0.98 11/20/2011       The blood pressure has been followed by  other physicians Also on multiple diuretics from cardiologist, not recently needing Zaroxolyn  CKD: Renal dysfunction patterns as follows:   Lab Results  Component Value Date   CREATININE 1.95 (H) 02/05/2017   CREATININE 1.75 (H) 12/22/2016   CREATININE 1.85 (H) 12/14/2016     Diabetic foot exam in 03/2016 showed  normal monofilament sensation in the toes except decreased on the right great toe and plantar surfaces, no skin lesions or ulcers on the feet and normal pedal pulses    Physical Examination:  BP 123/70 (BP Location: Left Arm, Patient Position: Sitting, Cuff Size: Normal)   Pulse 81   Temp 98.7 F (37.1 C) (Oral)   Ht '5\' 8"'$  (1.727 m)   Wt 264 lb (119.7 kg)   SpO2 93%   BMI 40.14 kg/m      ASSESSMENT/PLAN  Diabetes type 2, uncontrolled with obesity    See history of present illness for detailed discussion of his current management, blood sugar patterns and problems identified  His recent A1c is higher at 8.3  On a regimen of basal bolus insulin with the V-go pump and also Victoza  His blood sugars are reportedly higher at home and no labs available today Did not bring his monitor also His wife thinks that his diet has been totally uncontrolled with no carbohydrates, sweets and  fruits Also continuing to gain weight This is despite being consistent with his VICTOZA  Recommendations:   Since his renal function is inadequate he will need to continue only 1000 mg of metformin but change it to the evening He will need to switch to the 40 unit V-go pump, sample also given Discussed in detail the need for improving his diet and excessive carbohydrates Consultation with the dietitian Increase suppertime boluses by at least 2-4 units Extra 2-4 units for higher carbohydrate snacks if he has them  Encouraged him to be as active as possible  Consider using the Omnipod pump on the next visit if he is still interested but unlikely he can follow instruis  LIPIDS: LDL and triglycerides controlled, has persistently low HDL from diabetic dyslipidemia  Hypothyroid:adequately replaced  Patient Instructions  5-6 clicks at supper  No sweet fruits or sugar desserts  Start walking  Metformin at dinner   Counseling time on subjects discussed in assessment and plan sections is over 50% of today's 25 minute visit   Elayne Snare 02/07/2017, 1:25 PM   Note: This office note was prepared with Dragon voice recognition system technology. Any transcriptional errors that result from this process are unintentional.

## 2017-02-08 DIAGNOSIS — M545 Low back pain: Secondary | ICD-10-CM | POA: Diagnosis not present

## 2017-02-08 DIAGNOSIS — M79605 Pain in left leg: Secondary | ICD-10-CM | POA: Diagnosis not present

## 2017-02-08 DIAGNOSIS — M79604 Pain in right leg: Secondary | ICD-10-CM | POA: Diagnosis not present

## 2017-02-12 DIAGNOSIS — M545 Low back pain: Secondary | ICD-10-CM | POA: Diagnosis not present

## 2017-02-12 DIAGNOSIS — M79605 Pain in left leg: Secondary | ICD-10-CM | POA: Diagnosis not present

## 2017-02-12 DIAGNOSIS — M79604 Pain in right leg: Secondary | ICD-10-CM | POA: Diagnosis not present

## 2017-02-15 DIAGNOSIS — M79604 Pain in right leg: Secondary | ICD-10-CM | POA: Diagnosis not present

## 2017-02-15 DIAGNOSIS — M545 Low back pain: Secondary | ICD-10-CM | POA: Diagnosis not present

## 2017-02-15 DIAGNOSIS — M79605 Pain in left leg: Secondary | ICD-10-CM | POA: Diagnosis not present

## 2017-02-18 ENCOUNTER — Emergency Department (HOSPITAL_BASED_OUTPATIENT_CLINIC_OR_DEPARTMENT_OTHER)
Admit: 2017-02-18 | Discharge: 2017-02-18 | Disposition: A | Discharge status: Home / Self Care | Payer: Medicare Other | Attending: Emergency Medicine | Admitting: Emergency Medicine

## 2017-02-18 ENCOUNTER — Emergency Department (HOSPITAL_COMMUNITY)
Admission: EM | Admit: 2017-02-18 | Discharge: 2017-02-18 | Disposition: A | Discharge status: Home / Self Care | Payer: Medicare Other | Attending: Emergency Medicine | Admitting: Emergency Medicine

## 2017-02-18 ENCOUNTER — Encounter (HOSPITAL_COMMUNITY): Payer: Self-pay

## 2017-02-18 ENCOUNTER — Other Ambulatory Visit: Payer: Self-pay

## 2017-02-18 DIAGNOSIS — Z9861 Coronary angioplasty status: Secondary | ICD-10-CM | POA: Diagnosis not present

## 2017-02-18 DIAGNOSIS — I5042 Chronic combined systolic (congestive) and diastolic (congestive) heart failure: Secondary | ICD-10-CM | POA: Diagnosis not present

## 2017-02-18 DIAGNOSIS — M79604 Pain in right leg: Secondary | ICD-10-CM

## 2017-02-18 DIAGNOSIS — E119 Type 2 diabetes mellitus without complications: Secondary | ICD-10-CM | POA: Insufficient documentation

## 2017-02-18 DIAGNOSIS — Z95810 Presence of automatic (implantable) cardiac defibrillator: Secondary | ICD-10-CM | POA: Insufficient documentation

## 2017-02-18 DIAGNOSIS — G20 Parkinson's disease: Secondary | ICD-10-CM | POA: Insufficient documentation

## 2017-02-18 DIAGNOSIS — M79609 Pain in unspecified limb: Secondary | ICD-10-CM

## 2017-02-18 DIAGNOSIS — I259 Chronic ischemic heart disease, unspecified: Secondary | ICD-10-CM | POA: Diagnosis not present

## 2017-02-18 DIAGNOSIS — M79661 Pain in right lower leg: Secondary | ICD-10-CM | POA: Diagnosis not present

## 2017-02-18 DIAGNOSIS — R2241 Localized swelling, mass and lump, right lower limb: Secondary | ICD-10-CM | POA: Diagnosis not present

## 2017-02-18 DIAGNOSIS — I110 Hypertensive heart disease with heart failure: Secondary | ICD-10-CM | POA: Diagnosis not present

## 2017-02-18 LAB — CBC WITH DIFFERENTIAL/PLATELET
BASOS PCT: 0 %
Basophils Absolute: 0 10*3/uL (ref 0.0–0.1)
EOS ABS: 0.2 10*3/uL (ref 0.0–0.7)
EOS PCT: 4 %
HCT: 35.7 % — ABNORMAL LOW (ref 39.0–52.0)
HEMOGLOBIN: 11.3 g/dL — AB (ref 13.0–17.0)
LYMPHS ABS: 1.7 10*3/uL (ref 0.7–4.0)
Lymphocytes Relative: 26 %
MCH: 27.9 pg (ref 26.0–34.0)
MCHC: 31.7 g/dL (ref 30.0–36.0)
MCV: 88.1 fL (ref 78.0–100.0)
Monocytes Absolute: 0.3 10*3/uL (ref 0.1–1.0)
Monocytes Relative: 5 %
NEUTROS PCT: 65 %
Neutro Abs: 4.4 10*3/uL (ref 1.7–7.7)
PLATELETS: 153 10*3/uL (ref 150–400)
RBC: 4.05 MIL/uL — ABNORMAL LOW (ref 4.22–5.81)
RDW: 15 % (ref 11.5–15.5)
WBC: 6.7 10*3/uL (ref 4.0–10.5)

## 2017-02-18 LAB — COMPREHENSIVE METABOLIC PANEL
ALK PHOS: 64 U/L (ref 38–126)
ALT: 19 U/L (ref 17–63)
AST: 15 U/L (ref 15–41)
Albumin: 3.8 g/dL (ref 3.5–5.0)
Anion gap: 12 (ref 5–15)
BUN: 24 mg/dL — AB (ref 6–20)
CALCIUM: 8.9 mg/dL (ref 8.9–10.3)
CHLORIDE: 101 mmol/L (ref 101–111)
CO2: 26 mmol/L (ref 22–32)
CREATININE: 1.77 mg/dL — AB (ref 0.61–1.24)
GFR, EST AFRICAN AMERICAN: 45 mL/min — AB (ref >60)
GFR, EST NON AFRICAN AMERICAN: 39 mL/min — AB (ref >60)
Glucose, Bld: 182 mg/dL — ABNORMAL HIGH (ref 65–99)
Potassium: 4 mmol/L (ref 3.5–5.1)
Sodium: 139 mmol/L (ref 135–145)
Total Bilirubin: 0.7 mg/dL (ref 0.3–1.2)
Total Protein: 6.5 g/dL (ref 6.5–8.1)

## 2017-02-18 NOTE — ED Provider Notes (Addendum)
Emergency Department Provider Note   I have reviewed the triage vital signs and the nursing notes.   HISTORY  Chief Complaint No chief complaint on file.   HPI Henry [name] is a 67 y.o. male with PMH of CAD, DM, Parkinson's disease presents to the emergency department for evaluation of pain in the right leg and top of the left foot.  Symptoms have worsened significantly over the past several days.  No injury or falls.  Wife states he has been stumbling more at home.  Recently discontinued 1 of his Parkinson's meds 1-2 months ago but denies any decreased mobility or pain since this medication was stopped.  Continues to be compliant with his diabetes medication.  He states that pain is improved with walking and worse when he is lying flat.  He describes a restless type sensation in addition to the pain.  No other modifying factors.  Denies any chest pain, shortness of breath, abdominal discomfort.   Past Medical History:  Diagnosis Date  . AICD (automatic cardioverter/defibrillator) present    Dr Lovena Le office visit yearly, MDT   . Arthritis    cane  . Asthma   . Benign neoplasm of colon   . CAD (coronary artery disease)   . Cardiomyopathy   . Complication of anesthesia    pt states that he got a rash  . Constipation   . 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 DM (diabetes mellitus) ([place])    TYPE 2   . GERD (gastroesophageal reflux disease)   . Glaucoma    right eye  . HLD (hyperlipidemia)   . HTN (hypertension)    pt denies 08/19/12  . Hyperplasia, prostate   . Hyperthyroidism    thyroid lobectomy  . MI (myocardial infarction) [facility])    Dr Stanford Breed 2000, x3vessels bypass  . Nephrolithiasis   . OSA (obstructive sleep apnea)    AHI-28,on CPAP, noncompliant with CPAP  . Parkinson disease ([place])    1999  . PONV (postoperative nausea and vomiting)   . Restless legs   . Shortness of breath    Hx: of at all times  . Sleep apnea    does not use  CPAP  . Sleep apnea, organic   . UTI (lower urinary tract infection) 09/15/12   Klebsiella  . Ventral hernia     Patient Active Problem List   Diagnosis Date Noted  . Urinary frequency 09/19/2016  . Constipation 11/27/2015  . Medicare annual wellness visit, initial 09/23/2015  . Advance care planning 09/23/2015  . Spinal stenosis of lumbar region 01/26/2015  . Bite, insect 09/28/2014  . Restless leg syndrome 08/11/2014  . ICD- MDT, implanted 11/13/13 11/14/2013  . Chronic systolic CHF (congestive heart failure) ([place]) 11/13/2013  . Acute on chronic combined systolic and diastolic congestive heart failure, NYHA class 4 ([place]) 05/12/2013  . 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 Elevated PSA 12/04/2012  . Other malaise and fatigue 12/04/2012  . PD (Parkinson's disease) ([place]) 08/19/2012  . OSA on CPAP 04/17/2012  . Dysautonomia ([place]) 04/17/2012  . Obesity, morbid ([place]) 04/17/2012  . Hypothyroidism following radioiodine therapy 10/16/2011  . DYSPNEA/SHORTNESS OF BREATH 11/09/2009  . GERD 05/11/2009  . Poorly controlled type 2 diabetes mellitus with circulatory disorder ([place]) 04/26/2009  . OTHER SPEC FORMS CHRONIC ISCHEMIC HEART DISEASE 04/24/2008  . HYPERPLASIA PROSTATE UNS W/O UR OBST & OTH LUTS 09/02/2007  . NEPHROLITHIASIS, HX OF 09/02/2007  . HERNIA, VENTRAL 12/28/2006  .  Hyperlipidemia 08/31/2006  . HTN (hypertension) 08/31/2006  . Hx of CABG x 3 in 2000 08/31/2006    Past Surgical History:  Procedure Laterality Date  . 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 CARDIAC CATHETERIZATION    . CATARACT EXTRACTION W/ INTRAOCULAR LENS IMPLANT     Hx: of right eye  . COLONOSCOPY N/A 10/13/2014   Procedure: COLONOSCOPY;  Surgeon: Jerene Bears, MD;  Location: [facility] ENDOSCOPY;  Service: Gastroenterology;  Laterality: N/A;  . COLONOSCOPY W/ BIOPSIES AND POLYPECTOMY     Hx: of  . CORONARY ARTERY BYPASS GRAFT  2000   [name], MD  . [place]  .  DEEP BRAIN STIMULATOR PLACEMENT  2004   Right and left VIN stimulator placement (parkinsons)  . EYE SURGERY    . FINGER AMPUTATION     left pointer  . IMPLANTABLE CARDIOVERTER DEFIBRILLATOR IMPLANT N/A 11/13/2013   Procedure: IMPLANTABLE CARDIOVERTER DEFIBRILLATOR IMPLANT;  Surgeon: Evans Lance, MD;  Location: [facility] CATH LAB;  Service: Cardiovascular;  Laterality: N/A;  . INSERT / REPLACE / REMOVE PACEMAKER    . LEFT AND RIGHT HEART CATHETERIZATION WITH CORONARY ANGIOGRAM N/A 09/24/2013   Procedure: LEFT AND RIGHT HEART CATHETERIZATION WITH CORONARY ANGIOGRAM;  Surgeon: Burnell Blanks, MD;  Location: [facility] CATH LAB;  Service: Cardiovascular;  Laterality: N/A;  . LITHOTRIPSY     3 different times  . MEDIAN STERNOTOMY  2000  . SUBTHALAMIC STIMULATOR BATTERY REPLACEMENT N/A 09/05/2012   Procedure: Deep brain stimulator battery change;  Surgeon: Erline Levine, MD;  Location: [place] NEURO ORS;  Service: Neurosurgery;  Laterality: N/A;  Deep brain stimulator battery change  . SUBTHALAMIC STIMULATOR BATTERY REPLACEMENT N/A 06/10/2015   Procedure: Deep Brain stimulator battery change;  Surgeon: Erline Levine, MD;  Location: [place] [facility]URO ORS;  Service: Neurosurgery;  Laterality: N/A;  . TONSILLECTOMY      Current Outpatient Rx  . Order #: [id number] Class: No Print  . Order #: [id number] Class: Normal  . Order #: [id number] Class: Historical Med  . Order #: [id number] Class: Normal  . Order #: [id number] Class: Normal  . Order #: [id number] Class: Normal  . Order #: [id number] Class: Normal  . Order #: [id number] Class: Historical Med  . Order #: [id number] Class: Normal  . Order #: [id number] Class: Normal  . Order #: [id number] Class: Normal  . Order #: [id number] Class: Historical Med  . Order #: [id number] Class: Normal  . Order #: [id number] Class: Normal  . Order #: [id number] Class: Historical Med  . Order #: [id number] Class: Historical Med  . Order #: [id number] Class: Normal  . Order #: [id number] Class: Normal  . Order #:  [id number] Class: Print  . Order #: [id number] Class: Normal  . Order #: [id number] Class: Normal  . Order #: [id number] Class: Normal  . Order #: [id number] Class: Normal  . Order #: [id number] Class: Phone In  . Order #: [id number] Class: Historical Med  . Order #: [id number] Class: Normal  . Order #: [id number] Class: Normal  . Order #: [id number] Class: Historical Med    Allergies Penicillins and Watermelon [citrullus vulgaris]  Family History  Problem Relation Age of Onset  . Aneurysm Mother   . Alcoholism Father   . HIV/AIDS Brother 81       AIDS  . Healthy Child   . Peripheral vascular disease Unknown   . Arthritis Unknown   . Healthy Child   . Healthy Child   . Diabetes Neg Hx   . Heart disease Neg Hx   . Colon cancer Neg Hx   . Prostate  cancer Neg Hx     Social History Social History   Tobacco Use  . Smoking status: Never Smoker  . Smokeless tobacco: Never Used  Substance Use Topics  . Alcohol use: Yes    Comment: occasional beer  . Drug use: No    Review of Systems  Constitutional: No fever/chills Eyes: No visual changes. ENT: No sore throat. Cardiovascular: Denies chest pain. Respiratory: Denies shortness of breath. Gastrointestinal: No abdominal pain.  No nausea, no vomiting.  No diarrhea.  No constipation. Genitourinary: Negative for dysuria. Musculoskeletal: Negative for back pain. Positive right leg and left foot pain.  Skin: Negative for rash. Neurological: Negative for headaches, focal weakness or numbness.  10-point ROS otherwise negative.  ____________________________________________   PHYSICAL EXAM:  VITAL SIGNS: ED Triage Vitals  Enc Vitals Group     BP 02/18/17 1100 112/72     Pulse Rate 02/18/17 1100 75     Resp 02/18/17 1100 18     Temp 02/18/17 1100 98.7 F (37.1 C)     Temp Source 02/18/17 1100 Oral     SpO2 02/18/17 1100 95 %     Weight 02/18/17 1100 255 lb (115.7 kg)     Height 02/18/17 1100 5\' 8"  (1.727 m)     Pain Score 02/18/17 1113  5   Constitutional: Alert and oriented. Well appearing and in no acute distress. Eyes: Conjunctivae are normal.  Head: Atraumatic. Nose: No congestion/rhinnorhea. Mouth/Throat: Mucous membranes are moist.  Neck: No stridor.   Cardiovascular: Normal rate, regular rhythm. Good peripheral circulation. Grossly normal heart sounds.   Respiratory: Normal respiratory effort.  No retractions. Lungs CTAB. Gastrointestinal: Soft and nontender. No distention.  Musculoskeletal: No lower extremity tenderness to palpation. Normal ROM of all joints without warmth or redness. Right leg slightly more swollen compared to left. No gross deformities of extremities. Neurologic:  Normal speech and language. No gross focal neurologic deficits are appreciated.  Skin:  Skin is warm, dry and intact. No rash noted.  ____________________________________________   LABS (all labs ordered are listed, but only abnormal results are displayed)  Labs Reviewed  COMPREHENSIVE METABOLIC PANEL - Abnormal; Notable for the following components:      Result Value   Glucose, Bld 182 (*)    BUN 24 (*)    Creatinine, Ser 1.77 (*)    GFR calc non Af Amer 39 (*)    GFR calc Af Amer 45 (*)    All other components within normal limits  CBC WITH DIFFERENTIAL/PLATELET - Abnormal; Notable for the following components:   RBC 4.05 (*)    Hemoglobin 11.3 (*)    HCT 35.7 (*)    All other components within normal limits   ____________________________________________  RADIOLOGY  [place] reported by [facility] as normal.  ____________________________________________   PROCEDURES  Procedure(s) performed:   Procedures  None ____________________________________________   INITIAL IMPRESSION / ASSESSMENT AND PLAN / ED COURSE  Pertinent labs & imaging results that were available during my care of the patient were reviewed by me and considered in my medical decision making (see chart for details).  Patient presents to the  emergency department for evaluation of pain in both legs with pain worse on the right.  Seems most consistent with neuropathy type pain however the right leg seems more swollen compared to the.  Plan for vascular ultrasound of the right lower extremity to rule out DVT.  The pain in the left is primarily in the top of the foot.  He has no pain or swelling in the calf or behind the knee on that side.  He has normal pulses in the bilateral feet along with normal sensation.  Joints appear normal.  No symptoms consistent with claudication in fact he states it improves with walking.  03:27 PM Negative for DVT.  Labs reviewed with no acute electrolyte abnormalities.  Patient will follow with his primary care physician for selection of medications for neuropathic pain.  I discussed that we will not be starting his department as they often require careful dose titration and will need to be followed by someone carefully considering his associated medical comorbidities and ongoing medication list.  Patient will continue taking his tramadol as needed for severe pain.   At this time, I do not feel there is any life-threatening condition present. I have reviewed and discussed all results (EKG, imaging, lab, urine as appropriate), exam findings with patient. I have reviewed nursing notes and appropriate previous records.  I feel the patient is safe to be discharged home without further emergent workup. Discussed usual and customary return precautions. Patient and family (if present) verbalize understanding and are comfortable with this plan.  Patient will follow-up with their primary care provider. If they do not have a primary care provider, information for follow-up has been provided to them. All questions have been answered.  ____________________________________________  FINAL CLINICAL IMPRESSION(S) / ED DIAGNOSES  Final diagnoses:  Right leg pain    Note:  This document was prepared using Dragon voice  recognition software and may include unintentional dictation errors.  Nanda Quinton, MD Emergency Medicine    Long, Wonda [facility], MD 02/18/17 1434    Margette Fast, MD 02/18/17 [id number]

## 2017-02-18 NOTE — ED Notes (Signed)
Pt ambulated to restroom independently with cane. Gait steady/even.

## 2017-02-18 NOTE — ED Triage Notes (Signed)
Patient complains of bilateral intermittent leg pain for several days. Denies trauma but complains the pain is all below knees.

## 2017-02-18 NOTE — Discharge Instructions (Signed)
He was seen in the emergency department today with right leg pain.  There is no evidence of blood clot.  This may be nerve related pain either from your diabetes or possibly Parkinson's.  Like free to follow-up with your primary care physician and neurologist regarding treatment for this pain that will work best with your ongoing medications.   Return to the emergency department with any chest pain, shortness of breath, worsening pain, weakness, or other concerning symptoms.

## 2017-02-18 NOTE — Progress Notes (Signed)
VASCULAR LAB PRELIMINARY  PRELIMINARY  PRELIMINARY  PRELIMINARY  Right lower extremity venous duplex completed.    Preliminary report:  There is no evidence of DVT or SVT noted in the right lower extremity.   Caelan Branden, RVT 02/18/2017, 3:30 PM

## 2017-02-19 ENCOUNTER — Telehealth: Payer: Self-pay | Admitting: Neurology

## 2017-02-19 NOTE — Telephone Encounter (Signed)
Patient made aware. He has appt with Dr. Damita Dunnings on Thursday.

## 2017-02-19 NOTE — Telephone Encounter (Signed)
I generally don't take care of leg and foot pain.  I may be able to see him Monday (week from today) at 2:30 but probably should have it looked at by PCP.

## 2017-02-19 NOTE — Telephone Encounter (Signed)
Patient went to the hospital on Sunday 02-18-17  With left leg and foot pain it is not a blood clot. Patient was told to follow up with [place] but we dont have anything before his appt in April and so they would like to talk to someone. So they know what to do

## 2017-02-19 NOTE — Telephone Encounter (Signed)
ER note states to follow up with PCP. Can you review note and advise.

## 2017-02-20 ENCOUNTER — Other Ambulatory Visit: Payer: Self-pay

## 2017-02-20 ENCOUNTER — Telehealth: Payer: Self-pay | Admitting: Endocrinology

## 2017-02-20 MED ORDER — V-GO 40 KIT: PACK | 2 refills | Status: DC

## 2017-02-20 NOTE — Telephone Encounter (Signed)
Please advise if patient is taking VGo 30 kit or the VGo 40 kit.

## 2017-02-20 NOTE — Telephone Encounter (Signed)
He has been changed to the 40 unit basal

## 2017-02-20 NOTE — Telephone Encounter (Signed)
Pts wife stated that he was put on V-GO 40, numbers look good  They need a script sent over for this.  I see the 30 in the medication list but not the 40 he was just recently put on    Insulin Disposable Pump (V-GO 30) KIT     [facility], [place], [place]  [phone]

## 2017-02-20 NOTE — Telephone Encounter (Signed)
I have sent this prescription to the [facility] for the VGo 40 kits.  I called the patient and left a voice message on his mobile number.

## 2017-02-22 ENCOUNTER — Ambulatory Visit (INDEPENDENT_AMBULATORY_CARE_PROVIDER_SITE_OTHER): Payer: Medicare Other | Admitting: Family Medicine

## 2017-02-22 ENCOUNTER — Encounter: Payer: Self-pay | Admitting: Family Medicine

## 2017-02-22 VITALS — BP 114/70 | HR 73 | Temp 97.7°F | Wt 259.8 lb

## 2017-02-22 DIAGNOSIS — I255 Ischemic cardiomyopathy: Secondary | ICD-10-CM | POA: Diagnosis not present

## 2017-02-22 DIAGNOSIS — R202 Paresthesia of skin: Secondary | ICD-10-CM

## 2017-02-22 DIAGNOSIS — G629 Polyneuropathy, unspecified: Secondary | ICD-10-CM

## 2017-02-22 LAB — VITAMIN B12: VITAMIN B 12: 956 pg/mL — AB (ref 211–911)

## 2017-02-22 MED ORDER — GABAPENTIN 100 MG PO CAPS
100.0000 mg | ORAL_CAPSULE | Freq: Three times a day (TID) | ORAL | 0 refills | Status: DC | PRN
Start: 2017-02-22 — End: 2017-05-28

## 2017-02-22 NOTE — Patient Instructions (Signed)
Start taking gabapentin 100mg  at night.   You can gradually increase the dose up to 2 tabs up to 3 times a day.  Go to the lab on the way out.  We'll contact you with your lab report. Take care.  Glad to see you.  Let me know how you are doing as you increase the dose.

## 2017-02-22 NOTE — Progress Notes (Signed)
On DM2 tx per endo.  Stinging pain, R>L leg, worse at night.  No trauma.  H/o DM2 noted.  Less pain now, 1/10 pain now.  Pain worse standing and laying down.  Better sitting.   He would to the emergency room with concern for DVT.  Evaluation there was negative for DVT.  It was thought that he was more likely having neuropathic pain.  ER notes and eval d/w pt.    D/w pt about prev imaging with: 1. Grade 1 anterolisthesis at L3-4 with mild left subarticular and foraminal stenosis. This does not change significantly with standing, flexion, or extension. 2. Leftward broad-based disc protrusion and bilateral moderate facet hypertrophy at L4-5 without significant stenosis. 3. Rightward broad-based disc protrusion and asymmetric facet hypertrophy results in mild right subarticular and moderate right foraminal stenosis at L5-S1. 4. 9 mm nonobstructing stone at the lower pole of the left kidney. 5. 3 cm benign-appearing cyst at the upper pole of the right kidney.  Meds, vitals, and allergies reviewed.   ROS: Per HPI unless specifically indicated in ROS section   GEN: nad, alert and oriented, flat affect at baseline.  Speech at baseline.  HEENT: mucous membranes moist NECK: supple w/o LA CV: rrr.  PULM: ctab, no inc wob ABD: soft, +bs EXT: trace BLE edema SKIN: no acute rash Slow gait at baseline.   B SLR neg.

## 2017-02-23 ENCOUNTER — Encounter: Payer: Self-pay | Admitting: Endocrinology

## 2017-02-23 DIAGNOSIS — M79604 Pain in right leg: Secondary | ICD-10-CM | POA: Diagnosis not present

## 2017-02-23 DIAGNOSIS — M545 Low back pain: Secondary | ICD-10-CM | POA: Diagnosis not present

## 2017-02-23 DIAGNOSIS — M79605 Pain in left leg: Secondary | ICD-10-CM | POA: Diagnosis not present

## 2017-02-25 DIAGNOSIS — G629 Polyneuropathy, unspecified: Secondary | ICD-10-CM | POA: Insufficient documentation

## 2017-02-25 NOTE — Assessment & Plan Note (Signed)
He has noted history of diabetes.  His straight leg raise is negative bilaterally today.  He has more pain when he is standing but better when he is sitting.  I think he likely has a combination of diabetic neuropathic pain and also spinal stenosis.  Discussed with patient. Reasonable to start taking gabapentin 100mg  at night.   He can gradually increase the dose up to 2 tabs up to 3 times a day with routine cautions. Recheck B12 level just to make sure it is normal.  See notes on labs. He'll let me know how he is doing as he increase the dose of gabapentin.  >25 minutes spent in face to face time with patient, >50% spent in counselling or coordination of care.

## 2017-02-26 DIAGNOSIS — M545 Low back pain: Secondary | ICD-10-CM | POA: Diagnosis not present

## 2017-02-26 DIAGNOSIS — M79605 Pain in left leg: Secondary | ICD-10-CM | POA: Diagnosis not present

## 2017-02-26 DIAGNOSIS — M79604 Pain in right leg: Secondary | ICD-10-CM | POA: Diagnosis not present

## 2017-03-02 ENCOUNTER — Other Ambulatory Visit: Payer: Self-pay | Admitting: Family Medicine

## 2017-03-02 ENCOUNTER — Other Ambulatory Visit: Payer: Self-pay | Admitting: Cardiology

## 2017-03-02 DIAGNOSIS — M79604 Pain in right leg: Secondary | ICD-10-CM | POA: Diagnosis not present

## 2017-03-02 DIAGNOSIS — M79605 Pain in left leg: Secondary | ICD-10-CM | POA: Diagnosis not present

## 2017-03-02 DIAGNOSIS — I509 Heart failure, unspecified: Secondary | ICD-10-CM

## 2017-03-02 DIAGNOSIS — M545 Low back pain: Secondary | ICD-10-CM | POA: Diagnosis not present

## 2017-03-05 DIAGNOSIS — M79605 Pain in left leg: Secondary | ICD-10-CM | POA: Diagnosis not present

## 2017-03-05 DIAGNOSIS — M545 Low back pain: Secondary | ICD-10-CM | POA: Diagnosis not present

## 2017-03-05 DIAGNOSIS — M79604 Pain in right leg: Secondary | ICD-10-CM | POA: Diagnosis not present

## 2017-03-08 DIAGNOSIS — M545 Low back pain: Secondary | ICD-10-CM | POA: Diagnosis not present

## 2017-03-08 DIAGNOSIS — M79605 Pain in left leg: Secondary | ICD-10-CM | POA: Diagnosis not present

## 2017-03-08 DIAGNOSIS — M79604 Pain in right leg: Secondary | ICD-10-CM | POA: Diagnosis not present

## 2017-03-11 ENCOUNTER — Other Ambulatory Visit: Payer: Self-pay | Admitting: Family Medicine

## 2017-03-12 DIAGNOSIS — M79604 Pain in right leg: Secondary | ICD-10-CM | POA: Diagnosis not present

## 2017-03-12 DIAGNOSIS — M545 Low back pain: Secondary | ICD-10-CM | POA: Diagnosis not present

## 2017-03-12 DIAGNOSIS — M79605 Pain in left leg: Secondary | ICD-10-CM | POA: Diagnosis not present

## 2017-03-12 NOTE — Telephone Encounter (Signed)
Electronic refill Last refill 01/12/17 #60/1 Last office visit 02/22/17

## 2017-03-12 NOTE — Telephone Encounter (Signed)
Sent. Thanks.   

## 2017-03-13 IMAGING — CT CT L SPINE W/O CM
4 of 10 series · 12 of 33 positions shown, 14 images · non-contrast
Comparison: None.

CLINICAL DATA: Low back pain. RIGHT leg pain. Symptoms for 5 years.

EXAM:
CT LUMBAR SPINE WITHOUT CONTRAST
TECHNIQUE: Multidetector CT imaging of the lumbar spine was performed without
intravenous contrast administration. Multiplanar CT image
reconstructions were also generated.

[Series 4: l spine bone · axial · 0.27mm/px · z∈[-74,+2]mm · 2 of 91 slices shown, 3 images]
[im 31/91  soft-tissue]
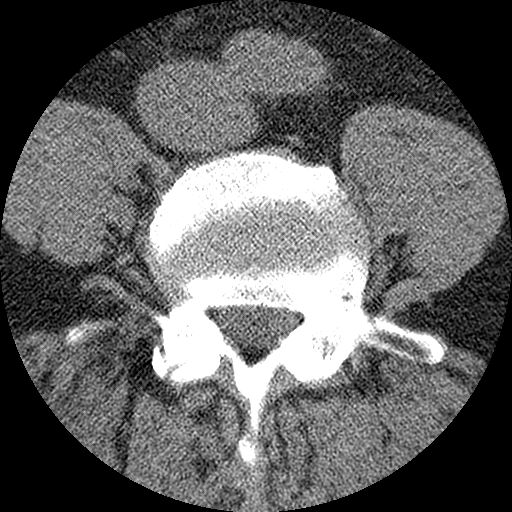
[im 31/91  bone]
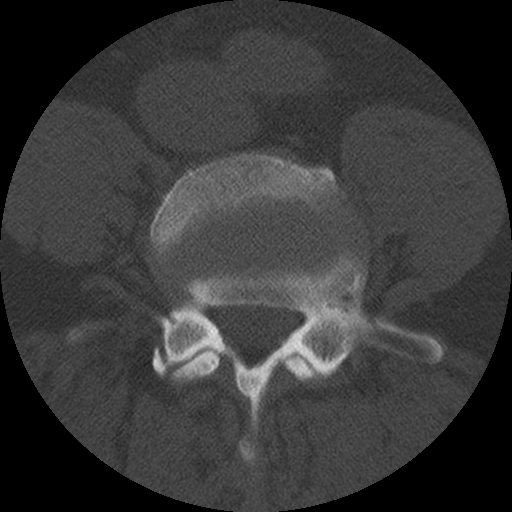
[im 61/91  bone]
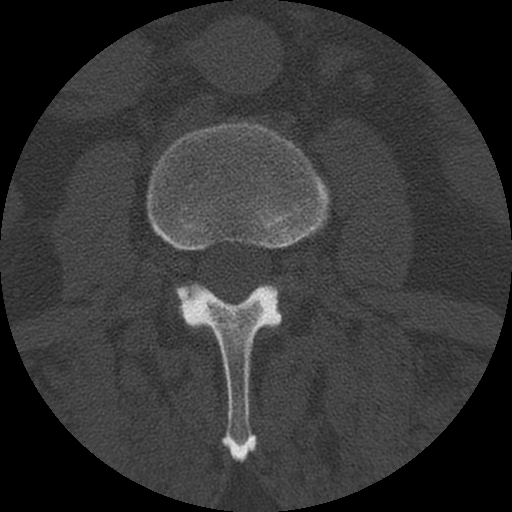

[Series 5: l-spine detail · axial · 0.27mm/px · z∈[-74,+2]mm · 2 of 91 slices shown]
[im 31/91  bone]
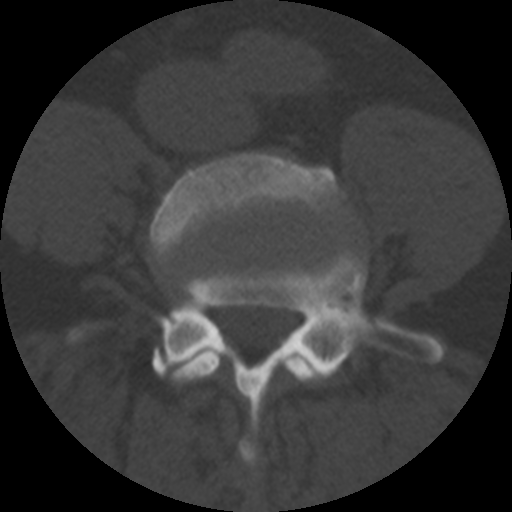
[im 61/91  bone]
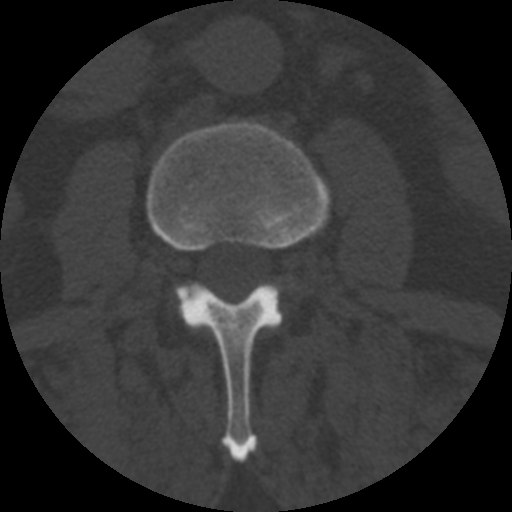

[Series 200: coronal · coronal · 0.45mm/px · 3 of 56 slices shown]
[im 12/56  bone]
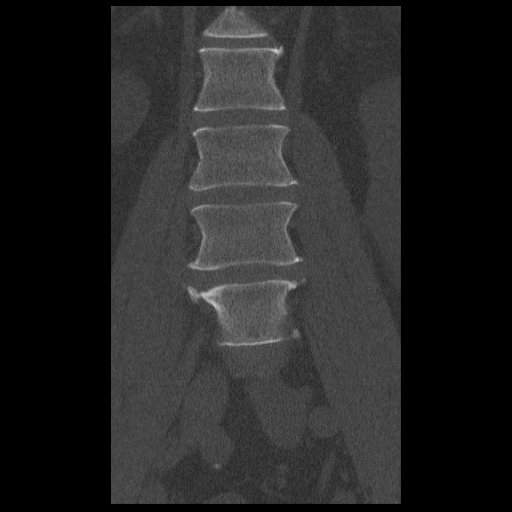
[im 23/56  bone]
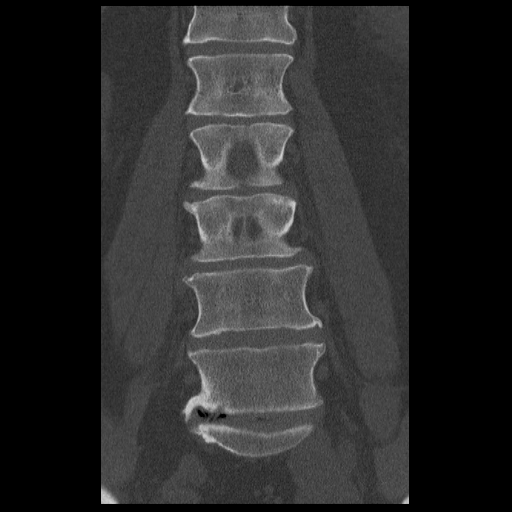
[im 34/56  bone]
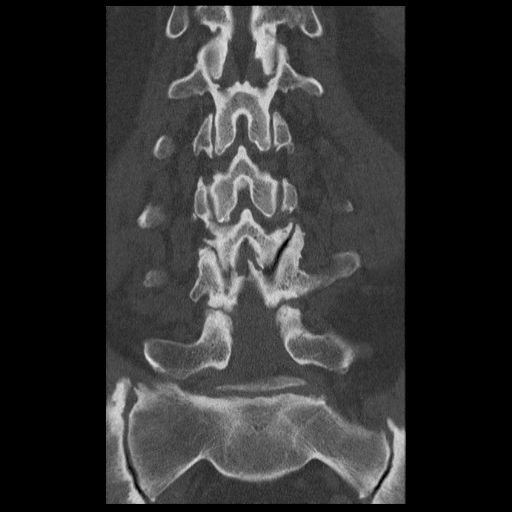

[Series 201: sagittal · sagittal · 0.45mm/px · 5 of 56 slices shown, 6 images]
[im 19/56  bone]
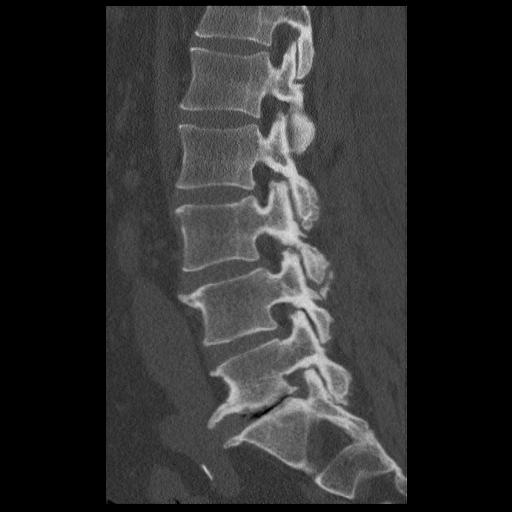
[im 23/56  bone]
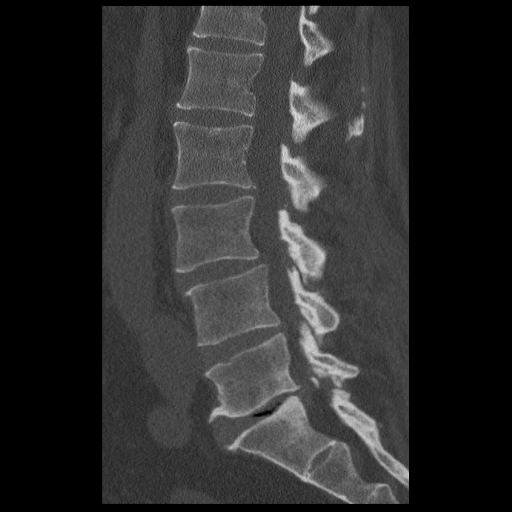
[im 28/56  soft-tissue]
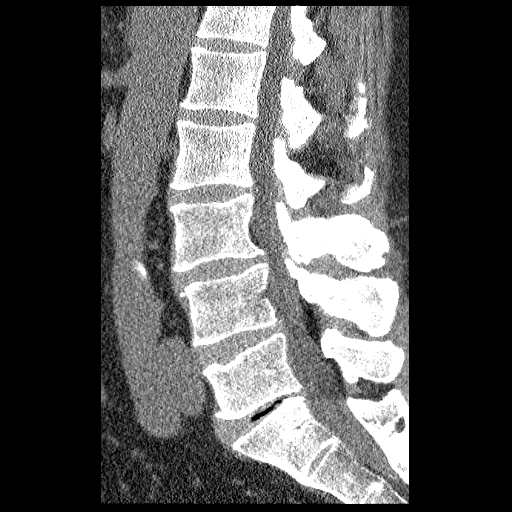
[im 28/56  bone]
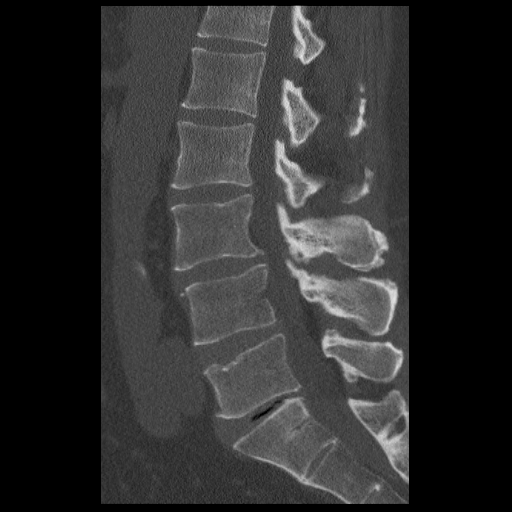
[im 33/56  bone]
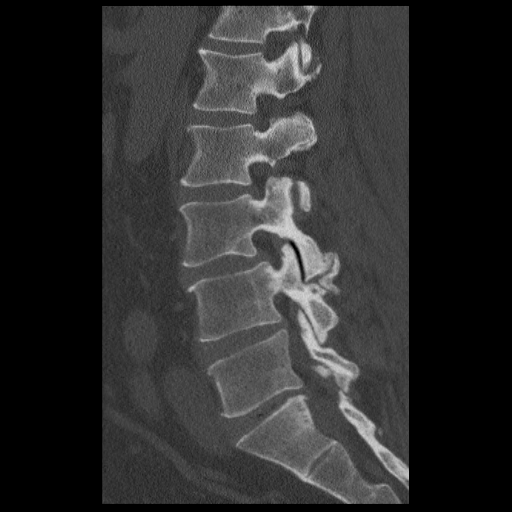
[im 37/56  bone]
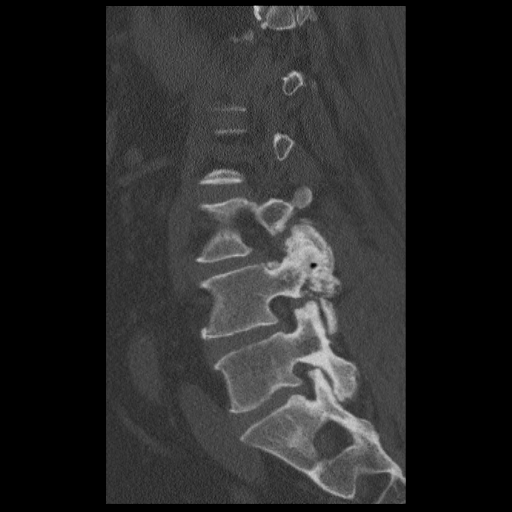

[12 of 33 positions shown; findings below may reference images not displayed]

FINDINGS: Five lumbar type vertebral bodies. 2 mm facet mediated
anterolisthesis L3-L4. Moderate disc space narrowing L5-S1. No
paravertebral masses. Non aneurysmal atherosclerotic calcification
of the aortoiliac system. No worrisome osseous lesions.

At L1-2 the disc space is unremarkable.

At L2-3, the disc bulges mildly. There is small annular
calcification on the RIGHT. There is mild facet arthropathy but no
impingement.

At L3-4, there is moderately severe spinal stenosis. Annular
calcification with disc uncovering is accompanied by posterior
element hypertrophy, affecting facets and ligamentum flavum with
vacuum phenomenon. BILATERAL subarticular zone narrowing could
affect either L4 nerve root.

At L4-5 there is mild stenosis related to annular bulging and
posterior element hypertrophy.

At L5-S1 there is moderate disc space narrowing. There is advanced
facet arthropathy and ligamentum flavum hypertrophy. Osseous
spurring extends to the RIGHT greater than LEFT neural foramina,
likely affecting both L5 nerve roots.
IMPRESSION: Potentially symptomatic neural impingement at L3-4 and L5-S1. See
discussion above. Neither level is rightward predominant, but either
could contribute to RIGHT-sided symptoms.

## 2017-03-15 DIAGNOSIS — M79605 Pain in left leg: Secondary | ICD-10-CM | POA: Diagnosis not present

## 2017-03-15 DIAGNOSIS — M545 Low back pain: Secondary | ICD-10-CM | POA: Diagnosis not present

## 2017-03-15 DIAGNOSIS — M79604 Pain in right leg: Secondary | ICD-10-CM | POA: Diagnosis not present

## 2017-03-16 ENCOUNTER — Other Ambulatory Visit: Payer: Self-pay | Admitting: Family Medicine

## 2017-03-19 DIAGNOSIS — M79604 Pain in right leg: Secondary | ICD-10-CM | POA: Diagnosis not present

## 2017-03-19 DIAGNOSIS — M545 Low back pain: Secondary | ICD-10-CM | POA: Diagnosis not present

## 2017-03-19 DIAGNOSIS — M79605 Pain in left leg: Secondary | ICD-10-CM | POA: Diagnosis not present

## 2017-03-22 ENCOUNTER — Ambulatory Visit: Payer: Medicare Other | Admitting: Dietician

## 2017-03-22 DIAGNOSIS — M79604 Pain in right leg: Secondary | ICD-10-CM | POA: Diagnosis not present

## 2017-03-22 DIAGNOSIS — M79605 Pain in left leg: Secondary | ICD-10-CM | POA: Diagnosis not present

## 2017-03-22 DIAGNOSIS — M545 Low back pain: Secondary | ICD-10-CM | POA: Diagnosis not present

## 2017-03-26 DIAGNOSIS — M79605 Pain in left leg: Secondary | ICD-10-CM | POA: Diagnosis not present

## 2017-03-26 DIAGNOSIS — M79604 Pain in right leg: Secondary | ICD-10-CM | POA: Diagnosis not present

## 2017-03-26 DIAGNOSIS — M545 Low back pain: Secondary | ICD-10-CM | POA: Diagnosis not present

## 2017-03-29 ENCOUNTER — Other Ambulatory Visit: Payer: Self-pay | Admitting: Endocrinology

## 2017-03-29 ENCOUNTER — Other Ambulatory Visit (INDEPENDENT_AMBULATORY_CARE_PROVIDER_SITE_OTHER): Payer: Medicare Other

## 2017-03-29 DIAGNOSIS — M79605 Pain in left leg: Secondary | ICD-10-CM | POA: Diagnosis not present

## 2017-03-29 DIAGNOSIS — E1165 Type 2 diabetes mellitus with hyperglycemia: Secondary | ICD-10-CM

## 2017-03-29 DIAGNOSIS — E1159 Type 2 diabetes mellitus with other circulatory complications: Secondary | ICD-10-CM

## 2017-03-29 DIAGNOSIS — M545 Low back pain: Secondary | ICD-10-CM | POA: Diagnosis not present

## 2017-03-29 DIAGNOSIS — M79604 Pain in right leg: Secondary | ICD-10-CM | POA: Diagnosis not present

## 2017-03-30 LAB — FRUCTOSAMINE: Fructosamine: 350 umol/L — ABNORMAL HIGH (ref 0–285)

## 2017-03-30 LAB — BASIC METABOLIC PANEL
BUN: 42 mg/dL — ABNORMAL HIGH (ref 6–23)
CALCIUM: 8.9 mg/dL (ref 8.4–10.5)
CO2: 31 mEq/L (ref 19–32)
CREATININE: 2 mg/dL — AB (ref 0.40–1.50)
Chloride: 99 mEq/L (ref 96–112)
GFR: 35.74 mL/min — AB (ref >60.00)
Glucose, Bld: 273 mg/dL — ABNORMAL HIGH (ref 70–99)
Potassium: 4.2 mEq/L (ref 3.5–5.1)
Sodium: 139 mEq/L (ref 135–145)

## 2017-04-04 ENCOUNTER — Encounter: Payer: Self-pay | Admitting: Family Medicine

## 2017-04-04 ENCOUNTER — Ambulatory Visit (INDEPENDENT_AMBULATORY_CARE_PROVIDER_SITE_OTHER): Payer: Medicare Other

## 2017-04-04 ENCOUNTER — Other Ambulatory Visit: Payer: Self-pay | Admitting: Family Medicine

## 2017-04-04 VITALS — BP 102/70 | HR 80 | Temp 97.9°F | Ht 68.0 in | Wt 260.8 lb

## 2017-04-04 DIAGNOSIS — Z0000 Encounter for general adult medical examination without abnormal findings: Secondary | ICD-10-CM | POA: Diagnosis not present

## 2017-04-04 DIAGNOSIS — E1165 Type 2 diabetes mellitus with hyperglycemia: Principal | ICD-10-CM

## 2017-04-04 DIAGNOSIS — E1159 Type 2 diabetes mellitus with other circulatory complications: Secondary | ICD-10-CM

## 2017-04-04 LAB — BASIC METABOLIC PANEL
BUN: 35 mg/dL — AB (ref 6–23)
CALCIUM: 9.3 mg/dL (ref 8.4–10.5)
CO2: 35 meq/L — AB (ref 19–32)
Chloride: 92 mEq/L — ABNORMAL LOW (ref 96–112)
Creatinine, Ser: 2.02 mg/dL — ABNORMAL HIGH (ref 0.40–1.50)
GFR: 35.33 mL/min — ABNORMAL LOW (ref >60.00)
GLUCOSE: 257 mg/dL — AB (ref 70–99)
Potassium: 3.9 mEq/L (ref 3.5–5.1)
SODIUM: 137 meq/L (ref 135–145)

## 2017-04-04 LAB — CBC WITH DIFFERENTIAL/PLATELET
BASOS ABS: 0.1 10*3/uL (ref 0.0–0.1)
Basophils Relative: 0.8 % (ref 0.0–3.0)
Eosinophils Absolute: 0.3 10*3/uL (ref 0.0–0.7)
Eosinophils Relative: 3.6 % (ref 0.0–5.0)
HCT: 36.3 % — ABNORMAL LOW (ref 39.0–52.0)
Hemoglobin: 12.5 g/dL — ABNORMAL LOW (ref 13.0–17.0)
LYMPHS ABS: 2.1 10*3/uL (ref 0.7–4.0)
Lymphocytes Relative: 26.6 % (ref 12.0–46.0)
MCHC: 34.4 g/dL (ref 30.0–36.0)
MCV: 82.5 fl (ref 78.0–100.0)
MONO ABS: 0.6 10*3/uL (ref 0.1–1.0)
MONOS PCT: 7.6 % (ref 3.0–12.0)
NEUTROS ABS: 4.8 10*3/uL (ref 1.4–7.7)
NEUTROS PCT: 61.4 % (ref 43.0–77.0)
PLATELETS: 151 10*3/uL (ref 150.0–400.0)
RBC: 4.4 Mil/uL (ref 4.22–5.81)
RDW: 14.8 % (ref 11.5–15.5)
WBC: 7.8 10*3/uL (ref 4.0–10.5)

## 2017-04-04 LAB — LIPID PANEL
CHOLESTEROL: 123 mg/dL (ref 0–200)
HDL: 32.3 mg/dL — AB (ref >39.00)
NonHDL: 90.75
TRIGLYCERIDES: 232 mg/dL — AB (ref 0.0–149.0)
Total CHOL/HDL Ratio: 4
VLDL: 46.4 mg/dL — AB (ref 0.0–40.0)

## 2017-04-04 LAB — LDL CHOLESTEROL, DIRECT: LDL DIRECT: 61 mg/dL

## 2017-04-04 NOTE — Patient Instructions (Signed)
Henry [name] , Thank you for taking time to come for your Medicare Wellness Visit. I appreciate your ongoing commitment to your health goals. Please review the following plan we discussed and let me know if I can assist you in the future.   These are the goals we discussed: Goals    . Increase physical activity     Starting 04/04/2017,  I will continue to participate in physical therapy for 45 minutes twice weekly.        This is a list of the screening recommended for you and due dates:  Health Maintenance  Topic Date Due  . Complete foot exam   04/10/2017*  . Pneumonia vaccines (2 of 2 - PPSV23) 04/10/2017*  . Hemoglobin A1C  08/05/2017  . Colon Cancer Screening  10/12/2017  . Eye exam for diabetics  11/22/2017  . Tetanus Vaccine  12/05/2022  . Flu Shot  Completed  .  Hepatitis C: One time screening is recommended by Center for Disease Control  (CDC) for  adults born from [birth year] through 1964.   Completed  . HIV Screening  Completed  *Topic was postponed. The date shown is not the original due date.   Preventive Care for Adults  A healthy lifestyle and preventive care can promote health and wellness. Preventive health guidelines for adults include the following key practices.  . A routine yearly physical is a good way to check with your health care provider about your health and preventive screening. It is a chance to share any concerns and updates on your health and to receive a thorough exam.  . Visit your dentist for a routine exam and preventive care every 6 months. Brush your teeth twice a day and floss once a day. Good oral hygiene prevents tooth decay and gum disease.  . The frequency of eye exams is based on your age, health, family medical history, use  of contact lenses, and other factors. Follow your health care provider's recommendations for frequency of eye exams.  . Eat a healthy diet. Foods like vegetables, fruits, whole grains, low-fat dairy products, and lean  protein foods contain the nutrients you need without too many calories. Decrease your intake of foods high in solid fats, added sugars, and salt. Eat the right amount of calories for you. Get information about a proper diet from your health care provider, if necessary.  . Regular physical exercise is one of the most important things you can do for your health. Most adults should get at least 150 minutes of moderate-intensity exercise (any activity that increases your heart rate and causes you to sweat) each week. In addition, most adults need muscle-strengthening exercises on 2 or more days a week.  Silver Sneakers may be a benefit available to you. To determine eligibility, you may visit the website: www.silversneakers.com or contact program at 516-737-8844 Mon-Fri between 8AM-8PM.   . Maintain a healthy weight. The body mass index (BMI) is a screening tool to identify possible weight problems. It provides an estimate of body fat based on height and weight. Your health care provider can find your BMI and can help you achieve or maintain a healthy weight.   For adults 20 years and older: ? A BMI below 18.5 is considered underweight. ? A BMI of 18.5 to 24.9 is normal. ? A BMI of 25 to 29.9 is considered overweight. ? A BMI of 30 and above is considered obese.   . Maintain normal blood lipids and cholesterol levels by exercising and  minimizing your intake of saturated fat. Eat a balanced diet with plenty of fruit and vegetables. Blood tests for lipids and cholesterol should begin at age 77 and be repeated every 5 years. If your lipid or cholesterol levels are high, you are over 50, or you are at high risk for heart disease, you may need your cholesterol levels checked more frequently. Ongoing high lipid and cholesterol levels should be treated with medicines if diet and exercise are not working.  . If you smoke, find out from your health care provider how to quit. If you do not use tobacco, please  do not start.  . If you choose to drink alcohol, please do not consume more than 2 drinks per day. One drink is considered to be 12 ounces (355 mL) of beer, 5 ounces (148 mL) of wine, or 1.5 ounces (44 mL) of liquor.  . If you are 20-50 years old, ask your health care provider if you should take aspirin to prevent strokes.  . Use sunscreen. Apply sunscreen liberally and repeatedly throughout the day. You should seek shade when your shadow is shorter than you. Protect yourself by wearing long sleeves, pants, a wide-brimmed hat, and sunglasses year round, whenever you are outdoors.  . Once a month, do a whole body skin exam, using a mirror to look at the skin on your back. Tell your health care provider of new moles, moles that have irregular borders, moles that are larger than a pencil eraser, or moles that have changed in shape or color.

## 2017-04-04 NOTE — Progress Notes (Signed)
   Subjective:    Patient ID: Henry [name], male    DOB: 01-24-1950, 67 y.o.   MRN: [id number]  HPI I reviewed health advisor's note, was available for consultation, and agree with documentation and plan.    Review of Systems     Objective:   Physical Exam        Assessment & Plan:

## 2017-04-04 NOTE — Progress Notes (Signed)
Subjective:   Henry [name] is a 67 y.o. male who presents for Medicare Annual/Subsequent preventive examination.  Review of Systems:  N/A Cardiac Risk Factors include: advanced age (>44mn, >>34women);male gender;obesity (BMI >30kg/m2);diabetes mellitus;dyslipidemia;hypertension     Objective:    Vitals: BP 102/70 (BP Location: Right Arm, Patient Position: Sitting, Cuff Size: Normal)   Pulse 80   Temp 97.9 F (36.6 C) (Oral)   Ht _0  (1.727 m)   Wt 260 lb 12 oz (118.3 kg)   SpO2 92%   BMI 39.65 kg/m   Body mass index is 39.65 kg/m.  Advanced Directives 04/04/2017 02/18/2017 06/10/2015 10/13/2014 09/26/2014 08/11/2014 03/12/2014  Does Patient Have a Medical Advance Directive? Yes No No No No Yes No  Type of AParamedicof APierpointLiving will - - - - HPress photographerLiving will -  Does patient want to make changes to medical advance directive? - - - - - No - Patient declined -  Copy of HSummertownin Chart? No - copy requested - - - - No - copy requested -  Would patient like information on creating a medical advance directive? - No - Patient declined No - patient declined information - Yes -Higher education careers advisergiven - Yes - Educational materials given  Pre-existing out of facility DNR order (yellow form or pink MOST form) - - - - - - -    Tobacco Social History   Tobacco Use  Smoking Status Never Smoker  Smokeless Tobacco Never Used     Counseling given: No   Clinical Intake:  Pre-visit preparation completed: Yes  Pain : No/denies pain Pain Score: 0-No pain     Nutritional Status: BMI > 30  Obese Nutritional Risks: None Diabetes: Yes CBG done?: No Did pt. bring in CBG monitor from home?: No  How often do you need to have someone help you when you read instructions, pamphlets, or other written materials from your doctor or pharmacy?: 1 - Never What is the last grade level you completed in school?: 12th  grade  Interpreter Needed?: No  Comments: pt lives with spouse Information entered by :: LPinson, LPN  Past Medical History:  Diagnosis Date  . AICD (automatic cardioverter/defibrillator) present    Dr TLovena Leoffice visit yearly, MDT   . Arthritis    cane  . Asthma   . Benign neoplasm of colon   . CAD (coronary artery disease)   . Cardiomyopathy   . Cataract   . Complication of anesthesia    pt states that he got a rash  . Constipation   . 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 [place]    [place] 2   . GERD (gastroesophageal reflux disease)   . Glaucoma    right eye  . HLD (hyperlipidemia)   . HTN (hypertension)    pt denies 08/19/12  . Hyperplasia, prostate   . Hyperthyroidism    thyroid lobectomy  . MI (myocardial infarction) ([facility]    Dr CStanford Breed2000, x3vessels bypass  . Nephrolithiasis   . OSA (obstructive sleep apnea)    AHI-28,on CPAP, noncompliant with CPAP  . Parkinson disease [place]    1999  . PONV (postoperative nausea and vomiting)   . Restless legs   . Shortness of breath    Hx: of at all times  . Sleep apnea    does not use CPAP  . Sleep apnea,  organic   . UTI (lower urinary tract infection) 09/15/12   Klebsiella  . Ventral hernia    Past Surgical History:  Procedure Laterality Date  . 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 CARDIAC CATHETERIZATION    . CATARACT EXTRACTION W/ INTRAOCULAR LENS IMPLANT     Hx: of right eye  . CATARACT EXTRACTION W/ INTRAOCULAR LENS IMPLANT Left 2018  . COLONOSCOPY N/A 10/13/2014   Procedure: COLONOSCOPY;  Surgeon: Jerene Bears, MD;  Location: [facility] ENDOSCOPY;  Service: Gastroenterology;  Laterality: N/A;  . COLONOSCOPY W/ BIOPSIES AND POLYPECTOMY     Hx: of  . CORONARY ARTERY BYPASS GRAFT  2000   [name], MD  . [place]  . DEEP BRAIN STIMULATOR PLACEMENT  2004   Right and left VIN stimulator placement (parkinsons)  . EYE SURGERY      . FINGER AMPUTATION     left pointer  . IMPLANTABLE CARDIOVERTER DEFIBRILLATOR IMPLANT N/A 11/13/2013   Procedure: IMPLANTABLE CARDIOVERTER DEFIBRILLATOR IMPLANT;  Surgeon: Evans Lance, MD;  Location: [facility] CATH LAB;  Service: Cardiovascular;  Laterality: N/A;  . INSERT / REPLACE / REMOVE PACEMAKER    . LEFT AND RIGHT HEART CATHETERIZATION WITH CORONARY ANGIOGRAM N/A 09/24/2013   Procedure: LEFT AND RIGHT HEART CATHETERIZATION WITH CORONARY ANGIOGRAM;  Surgeon: Burnell Blanks, MD;  Location: [facility] C[facility] LAB;  Service: Cardiovascular;  Laterality: N/A;  . LITHOTRIPSY     3 different times  . MEDIAN STERNOTOMY  2000  . SUBTHALAMIC STIMULATOR BATTERY REPLACEMENT N/A 09/05/2012   Procedure: Deep brain stimulator battery change;  Surgeon: Erline Levine, MD;  Location: [place] NEURO ORS;  Service: Neurosurgery;  Laterality: N/A;  Deep brain stimulator battery change  . SUBTHALAMIC STIMULATOR BATTERY REPLACEMENT N/A 06/10/2015   Procedure: Deep Brain stimulator battery change;  Surgeon: Erline Levine, MD;  Location: [place] NEURO ORS;  Service: Neurosurgery;  Laterality: N/A;  . TONSILLECTOMY     Family History  Problem Relation Age of Onset  . Aneurysm Mother   . Alcoholism Father   . HIV/AIDS Brother 64       AIDS  . Healthy Child   . Peripheral vascular disease Unknown   . Arthritis Unknown   . Healthy Child   . Healthy Child   . Diabetes Neg Hx   . Heart disease Neg Hx   . Colon cancer Neg Hx   . Prostate cancer Neg Hx    Social History   Socioeconomic History  . Marital status: Married    Spouse name: [name]  . Number of children: 2  . Years of education: Not on file  . Highest education level: Not on file  Occupational History  . Occupation: DISABLED    Comment: [employer]  Social Needs  . Financial resource strain: Not on file  . Food insecurity:    Worry: Not on file    Inability: Not on file  . Transportation needs:    Medical: Not on file    Non-medical: Not on  file  Tobacco Use  . Smoking status: Never Smoker  . Smokeless tobacco: Never Used  Substance and Sexual Activity  . Alcohol use: Yes    Comment: occasional beer  . Drug use: No  . Sexual activity: Not on file  Lifestyle  . Physical activity:    Days per week: Not on file    Minutes per session: Not on file  . Stress: Not on file  Relationships  . Social connections:    Talks on phone: Not on file    Gets together: Not on file    Attends religious service: Not on file    Active member of club or organization: Not on file    Attends meetings of clubs or organizations: Not on file    Relationship status: Not on file  Other Topics Concern  . Not on file  Social History Narrative   [facility]   Retired/disability Clinical research associate   Likes to fish.     Married 1972   3 kids   [place] fan    Outpatient Encounter Medications as of 04/04/2017  Medication Sig  . acetaminophen (TYLENOL) 325 MG tablet Take 1-2 tablets (325-650 mg total) by mouth every 4 (four) hours as needed for mild pain.  Marland Kitchen albuterol (PROVENTIL HFA;VENTOLIN HFA) 108 (90 Base) MCG/ACT inhaler Inhale 2 puffs into the lungs every 6 (six) hours as needed for wheezing or shortness of breath.  Marland Kitchen aspirin EC 81 MG tablet Take 81 mg by mouth daily.  . betamethasone dipropionate (DIPROLENE) 0.05 % cream Apply topically 2 (two) times daily as needed.  . Fluticasone-Salmeterol (ADVAIR) 250-50 MCG/DOSE AEPB Inhale 1 puff into the lungs 2 (two) times daily.  . furosemide (LASIX) 80 MG tablet TAKE 1 & 1/2 TABLETS DAILY  . gabapentin (NEURONTIN) 100 MG capsule Take 1-2 capsules (100-200 mg total) by mouth 3 (three) times daily as needed.  Marland Kitchen HUMALOG 100 UNIT/ML injection USE MAX 56 UNITS PER DAY   WITH V-GO PUMP  . ibuprofen (ADVIL,MOTRIN) 200 MG tablet Take 800 mg by mouth every 6 (six) hours as needed (For pain.).   . Insulin Disposable Pump (V-GO 40) KIT Use one pod daily with insulin  . levothyroxine (SYNTHROID,  LEVOTHROID) 125 MCG tablet TAKE 1 TABLET EVERY DAY DAILY BEFORE BREAKFAST  . lisinopril (PRINIVIL,ZESTRIL) 2.5 MG tablet TAKE 1 TABLET BY MOUTH DAILY.  . metFORMIN (GLUCOPHAGE) 1000 MG tablet Take 1,000 mg by mouth daily with breakfast.  . metoprolol succinate (TOPROL-XL) 50 MG 24 hr tablet TAKE 1 TABLET BY MOUTH DAILY. TAKE WITH OR IMMEDIATELY FOLLOWING A MEAL.  . Multiple Vitamin (MULTIVITAMIN WITH MINERALS) TABS tablet Take 1 tablet by mouth daily.   Marland Kitchen MYRBETRIQ 50 MG TB24 tablet Take 50 mg by mouth daily.  Marland Kitchen NOVOTWIST 32G X 5 MM MISC USE AS DIRECTED EVERY DAY  . ONETOUCH DELICA LANCETS 54Y MISC Use to check blood sugar 2 times per day dx code E11.49  . ONETOUCH VERIO test strip USE AS DIRECTED TO CHECK BLOOD SUGAR TWICE A DAY  . potassium chloride SA (K-DUR,KLOR-CON) 20 MEQ tablet Take 2 tablets (40 mEq total) by mouth daily.  . Pramipexole Dihydrochloride 1.5 MG TB24 TAKE 1 TABLET DAILY  . rosuvastatin (CRESTOR) 20 MG tablet Take 1 tablet (20 mg total) daily by mouth.  . traMADol (ULTRAM) 50 MG tablet TAKE 1 TABLET TWICE A DAY AS NEEDED  . travoprost, benzalkonium, (TRAVATAN) 0.004 % ophthalmic solution Place 1 drop into the right eye at bedtime.   . trihexyphenidyl (ARTANE) 2 MG tablet TAKE 1 TABLET BY MOUTH EVERY DAY  . VICTOZA 18 MG/3ML SOPN INJECT 1.8MG ONCE DAILY  . vitamin B-12 (CYANOCOBALAMIN) 1000 MCG tablet Take 1,000 mcg by mouth daily.  . metolazone (ZAROXOLYN) 2.5 MG tablet Take 1 tablet (2.5 mg total) by mouth daily. PRN FOR > 3 LB IN A DAY OR >5 LB IN WEEK  . nitroGLYCERIN (NITROSTAT) 0.4 MG SL  tablet Place 1 tablet (0.4 mg total) under the tongue every 5 (five) minutes as needed for chest pain.  . [DISCONTINUED] Insulin Disposable Pump (V-GO 30) KIT Use 1 pod daily with insulin   No facility-administered encounter medications on file as of 04/04/2017.     Activities of Daily Living In your present state of health, do you have any difficulty performing the following  activities: 04/04/2017  Hearing? Y  Vision? N  Difficulty concentrating or making decisions? Y  Walking or climbing stairs? Y  Comment Shortness of breath  Dressing or bathing? N  Doing errands, shopping? Y  Preparing Food and eating ? N  Using the Toilet? N  In the past six months, have you accidently leaked urine? Y  Do you have problems with loss of bowel control? N  Managing your Medications? N  Managing your Finances? N  Housekeeping or managing your Housekeeping? Y  Some recent data might be hidden    Patient Care Team: Tonia Ghent, MD as PCP - General (Family Medicine)   Assessment:   This is a routine wellness examination for [facility].  Hearing Screening Comments: R ear - deaf L ear - hearing aid Vision Screening Comments: Vision exam every 6 mths with Dr. Herbert Deaner   Exercise Activities and Dietary recommendations Current Exercise Habits: Structured exercise class, Time (Minutes): 45, Frequency (Times/Week): 2, Weekly Exercise (Minutes/Week): 90, Intensity: Mild, Exercise limited by: neurologic condition(s)  Goals    . Increase physical activity     Starting 04/04/2017,  I will continue to participate in physical therapy for 45 minutes twice weekly.        Fall Risk Fall Risk  04/04/2017 12/15/2016 07/13/2016 12/17/2015 09/21/2015  Falls in the past year? No Yes Yes Yes Yes  Number falls in past yr: - 2 or more 2 or more 2 or more 2 or more  Injury with Fall? - No No No No  Risk Factor Category  - High Fall Risk High Fall Risk High Fall Risk -  Risk for fall due to : - - - - -  Follow up - Falls evaluation completed Falls evaluation completed Falls evaluation completed -  Depression Screen PHQ 2/9 Scores 04/04/2017 09/21/2015 03/12/2014  PHQ - 2 Score 0 2 1  PHQ- 9 Score 0 12 -    Cognitive Function MMSE - Mini Mental State Exam 04/04/2017  Orientation to time 5  Orientation to Place 5  Registration 3  Attention/ Calculation 0  Recall 2  Recall-comments unable  to recall 1 of 3 words  Language- name 2 objects 0  Language- repeat 1  Language- follow 3 step command 3  Language- read & follow direction 0  Write a sentence 0  Copy design 0  Total score 19     PLEASE NOTE: A Mini-Cog screen was completed. Maximum score is 20. A value of 0 denotes this part of Folstein MMSE was not completed or the patient failed this part of the Mini-Cog screening.   Mini-Cog Screening Orientation to Time - Max 5 pts Orientation to Place - Max 5 pts Registration - Max 3 pts Recall - Max 3 pts Language Repeat - Max 1 pts Language Follow 3 Step Command - Max 3 pts     Immunization History  Administered Date(s) Administered  . H1N1 01/22/2008  . Influenza Split 12/22/2010, 11/07/2011  . Influenza Whole 10/17/2007, 11/16/2008, 11/09/2009  . Influenza,inj,Quad PF,6+ Mos 10/01/2014, 09/21/2015, 10/27/2016  . Influenza-Unspecified 10/25/2012, 11/27/2013  . Pneumococcal  Conjugate-13 03/25/2015  . Pneumococcal Polysaccharide-23 01/09/2010  . Tdap 12/04/2012    Screening Tests Health Maintenance  Topic Date Due  . FOOT EXAM  04/10/2017 (Originally 04/03/2017)  . PNA vac Low Risk Adult (2 of 2 - PPSV23) 04/10/2017 (Originally 07/30/2016)  . HEMOGLOBIN A1C  08/05/2017  . COLONOSCOPY  10/12/2017  . OPHTHALMOLOGY EXAM  11/22/2017  . TETANUS/TDAP  12/05/2022  . INFLUENZA VACCINE  Completed  . Hepatitis C Screening  Completed  . HIV Screening  Completed      Plan:     I have personally reviewed, addressed, and noted the following in the patient's chart:  A. Medical and social history B. Use of alcohol, tobacco or illicit drugs  C. Current medications and supplements D. Functional ability and status E.  Nutritional status F.  Physical activity G. Advance directives H. List of other physicians I.  Hospitalizations, surgeries, and ER visits in previous 12 months J.  Hop[place] to include hearing, vision, cognitive, depression L. Referrals and  appointments - none  In addition, I have reviewed and discussed with patient certain preventive protocols, quality metrics, and best practice recommendations. A written personalized care plan for preventive services as well as general preventive health recommendations were provided to patient.  See attached scanned questionnaire for additional information.   Signed,   Lindell Noe, MHA, BS, LPN Health Coach

## 2017-04-04 NOTE — Progress Notes (Signed)
PCP notes:   Health maintenance:  Foot exam - PCP please address at next appt PPSV23 - PCP please address at next appt  Abnormal screenings:   Mini-Cog score: 19/20 MMSE - Mini Mental State Exam 04/04/2017  Orientation to time 5  Orientation to Place 5  Registration 3  Attention/ Calculation 0  Recall 2  Recall-comments unable to recall 1 of 3 words  Language- name 2 objects 0  Language- repeat 1  Language- follow 3 step command 3  Language- read & follow direction 0  Write a sentence 0  Copy design 0  Total score 19    Patient concerns:   None  Nurse concerns:  None  Next PCP appt:   04/10/17 @ 1215

## 2017-04-05 ENCOUNTER — Encounter: Payer: Self-pay | Admitting: Endocrinology

## 2017-04-05 ENCOUNTER — Ambulatory Visit (INDEPENDENT_AMBULATORY_CARE_PROVIDER_SITE_OTHER): Payer: Medicare Other | Admitting: Endocrinology

## 2017-04-05 VITALS — BP 96/62 | HR 84 | Ht 68.0 in | Wt 263.0 lb

## 2017-04-05 DIAGNOSIS — E1165 Type 2 diabetes mellitus with hyperglycemia: Secondary | ICD-10-CM | POA: Diagnosis not present

## 2017-04-05 DIAGNOSIS — E063 Autoimmune thyroiditis: Secondary | ICD-10-CM | POA: Diagnosis not present

## 2017-04-05 DIAGNOSIS — I255 Ischemic cardiomyopathy: Secondary | ICD-10-CM

## 2017-04-05 DIAGNOSIS — Z794 Long term (current) use of insulin: Secondary | ICD-10-CM

## 2017-04-05 DIAGNOSIS — M545 Low back pain: Secondary | ICD-10-CM | POA: Diagnosis not present

## 2017-04-05 DIAGNOSIS — M79604 Pain in right leg: Secondary | ICD-10-CM | POA: Diagnosis not present

## 2017-04-05 DIAGNOSIS — M79605 Pain in left leg: Secondary | ICD-10-CM | POA: Diagnosis not present

## 2017-04-05 MED ORDER — INSULIN GLARGINE 100 UNIT/ML SOLOSTAR PEN: 20.0000 [IU] | PEN_INJECTOR | Freq: Every day | SUBCUTANEOUS | 1 refills | Status: DC

## 2017-04-05 NOTE — Patient Instructions (Addendum)
Cut back on Snacks, carbs and fruits  Keep food record  lantus 10 units in pm and go up weekly till am sugar <140  No metformin

## 2017-04-05 NOTE — Progress Notes (Signed)
Patient ID: Henry [name], male   DOB: Mar 19, 1950, 67 y.o.   MRN: [id number]           Reason for Appointment: Follow-up for Type 2 Diabetes   History of Present Illness:          Diagnosis: Type 2 diabetes mellitus, date of diagnosis: 2000      Past history:   Patient thinks he has been taking Amaryl for several years and probably metformin since onset also At some point he was changed from metformin to [facility] and was taking this since at least 2012 A1c had been higher in 2015 Since his A1c had been progressively higher with his regimen of Janumet and Amaryl he was started on Victoza in 01/2014 He was started on Lantus insulin in 5/16 because of persistent hyperglycemia especially fasting; was having readings as high as 293 Because of tendency to high postprandial readings and for control with Lantus he was switched to the V-go pump in  09/2014  Recent history:   INSULIN regimen: V-go-40 pump and boluses 4-8 units  Non-insulin hypoglycemic drugs the patient is taking are: Metformin 1 g a day, Victoza 1.8 mg daily in am   His A1c was last 8.3 Fructosamine is 350   Current management, blood sugar patterns and problems identified:  He has continued to have high blood sugars despite increasing his basal rate  Again his wife thinks that he is still overeating with snacks both day and night  Patient says that he is taking 4 clicks on his pump for meals but he also thinks that he runs out of his pump before the 24 hours is over, he probably is taking 2 clicks for snacks also  Blood sugars are persistently high and averaging over 200 at any given time of the day except during the night when he occasionally may check his sugar also  He thinks his blood sugar is too low if it is close to 100 and will eat a cookie then  Still not able to lose weight  Metformin has been reduced because of renal dysfunction  He was asked about how much Victoza he is taking and he appears to be  taking the proper dose of 1.8 mg on the demonstration pen  He has seen the dietitian in 3/16  Not able to exercise because of back pain and musculoskeletal issues and balance       Side effects from medications have been: None Compliance with the medical regimen: Fair  Hypoglycemia:   none  Glucose monitoring:  done 1-2 times  a day         Glucometer: One Touch ultra 2 .      Blood Glucose readings by   Mean values apply above for all meters except median for One Touch  PRE-MEAL Fasting Lunch Dinner  overnight Overall  Glucose range:  101-353    149-188   Mean/median: 236    232   POST-MEAL PC Breakfast PC Lunch  7-10 PM  Glucose range:    125-303  Mean/median:   248  226    Self-care: The diet that the patient has been following is: None, some fried food and cookies, mostly sugar-free         Meals: 9 am , 2-3 pm and 7 pm     Exercise: unable to do any          Dietician visit, most recent: 03/2014  Weight history:   Wt Readings from Last 3 Encounters:  04/05/17 263 lb (119.3 kg)  04/04/17 260 lb 12 oz (118.3 kg)  02/22/17 259 lb 12 oz (117.8 kg)    Glycemic control:   Lab Results  Component Value Date   HGBA1C 8.3 (H) 02/05/2017   HGBA1C 7.9 (H) 10/16/2016   HGBA1C 7.9 07/04/2016   Lab Results  Component Value Date   MICROALBUR <0.7 02/05/2017   LDLCALC 118 (H) 09/16/2015   CREATININE 2.02 (H) 04/04/2017    Lab Results  Component Value Date   FRUCTOSAMINE 350 (H) 03/29/2017   FRUCTOSAMINE 250 09/16/2015   FRUCTOSAMINE 259 12/15/2014     Other active problems: See review of systems      Allergies as of 04/05/2017      Reactions   Penicillins Rash, Other (See Comments)   WEARS ALLERGY BRACELET Because of a history of documented adverse serious drug reaction;Medi Alert bracelet  is recommended Has patient had a PCN reaction causing immediate rash, facial/tongue/throat swelling, SOB or lightheadedness with hypotension: Yes Has  patient had a PCN reaction causing severe rash involving mucus membranes or skin necrosis: unknown Has patient had a PCN reaction that required hospitalization NO Has patient had a PCN reaction occurring within the last 10 years: NO If all of    Peanut-containing Drug Products Other (See Comments)   cough   Watermelon [citrullus Vulgaris]    Tickle in throat and cough      Medication List        Accurate as of 04/05/17  2:24 PM. Always use your most recent med list.          acetaminophen 325 MG tablet Commonly known as:  TYLENOL Take 1-2 tablets (325-650 mg total) by mouth every 4 (four) hours as needed for mild pain.   albuterol 108 (90 Base) MCG/ACT inhaler Commonly known as:  PROVENTIL HFA;VENTOLIN HFA Inhale 2 puffs into the lungs every 6 (six) hours as needed for wheezing or shortness of breath.   aspirin EC 81 MG tablet Take 81 mg by mouth daily.   betamethasone dipropionate 0.05 % cream Commonly known as:  DIPROLENE Apply topically 2 (two) times daily as needed.   Fluticasone-Salmeterol 250-50 MCG/DOSE Aepb Commonly known as:  ADVAIR Inhale 1 puff into the lungs 2 (two) times daily.   furosemide 80 MG tablet Commonly known as:  LASIX TAKE 1 & 1/2 TABLETS DAILY   gabapentin 100 MG capsule Commonly known as:  NEURONTIN Take 1-2 capsules (100-200 mg total) by mouth 3 (three) times daily as needed.   HUMALOG 100 UNIT/ML injection Generic drug:  insulin lispro USE MAX 56 UNITS PER DAY   WITH V-GO PUMP   ibuprofen 200 MG tablet Commonly known as:  ADVIL,MOTRIN Take 800 mg by mouth every 6 (six) hours as needed (For pain.).   Insulin Glargine 100 UNIT/ML Solostar Pen Commonly known as:  LANTUS SOLOSTAR Inject 20 Units into the skin daily at 10 pm.   levothyroxine 125 MCG tablet Commonly known as:  SYNTHROID, LEVOTHROID TAKE 1 TABLET EVERY DAY DAILY BEFORE BREAKFAST   lisinopril 2.5 MG tablet Commonly known as:  PRINIVIL,ZESTRIL TAKE 1 TABLET BY MOUTH  DAILY.   metolazone 2.5 MG tablet Commonly known as:  ZAROXOLYN Take 1 tablet (2.5 mg total) by mouth daily. PRN FOR > 3 LB IN A DAY OR >5 LB IN WEEK   metoprolol succinate 50 MG 24 hr tablet Commonly known as:  TOPROL-XL TAKE 1 TABLET  BY MOUTH DAILY. TAKE WITH OR IMMEDIATELY FOLLOWING A MEAL.   multivitamin with minerals Tabs tablet Take 1 tablet by mouth daily.   MYRBETRIQ 50 MG Tb24 tablet Generic drug:  mirabegron ER Take 50 mg by mouth daily.   nitroGLYCERIN 0.4 MG SL tablet Commonly known as:  NITROSTAT Place 1 tablet (0.4 mg total) under the tongue every 5 (five) minutes as needed for chest pain.   NOVOTWIST 32G X 5 MM Misc Generic drug:  Insulin Pen Needle USE AS DIRECTED EVERY DAY   ONETOUCH DELICA LANCETS 34V Misc Use to check blood sugar 2 times per day dx code E11.49   ONETOUCH VERIO test strip Generic drug:  glucose blood USE AS DIRECTED TO CHECK BLOOD SUGAR TWICE A DAY   potassium chloride SA 20 MEQ tablet Commonly known as:  KLOR-CON M10 Take 2 tablets (40 mEq total) by mouth daily.   Pramipexole Dihydrochloride 1.5 MG Tb24 TAKE 1 TABLET DAILY   rosuvastatin 20 MG tablet Commonly known as:  CRESTOR Take 1 tablet (20 mg total) daily by mouth.   traMADol 50 MG tablet Commonly known as:  ULTRAM TAKE 1 TABLET TWICE A DAY AS NEEDED   travoprost (benzalkonium) 0.004 % ophthalmic solution Commonly known as:  TRAVATAN Place 1 drop into the right eye at bedtime.   trihexyphenidyl 2 MG tablet Commonly known as:  ARTANE TAKE 1 TABLET BY MOUTH EVERY DAY   V-GO 40 Kit Use one pod daily with insulin   VICTOZA 18 MG/3ML Sopn Generic drug:  liraglutide INJECT 1.'8MG'$  ONCE DAILY   vitamin B-12 1000 MCG tablet Commonly known as:  CYANOCOBALAMIN Take 1,000 mcg by mouth daily.       Allergies:  Allergies  Allergen Reactions  . Penicillins Rash and Other (See Comments)    WEARS ALLERGY BRACELET Because of a history of documented adverse serious drug  reaction;Medi Alert bracelet  is recommended Has patient had a PCN reaction causing immediate rash, facial/tongue/throat swelling, SOB or lightheadedness with hypotension: Yes Has patient had a PCN reaction causing severe rash involving mucus membranes or skin necrosis: unknown Has patient had a PCN reaction that required hospitalization NO Has patient had a PCN reaction occurring within the last 10 years: NO If all of   . Peanut-Containing Drug Products Other (See Comments)    cough  . Watermelon [Citrullus Vulgaris]     Tickle in throat and cough    Past Medical History:  Diagnosis Date  . AICD (automatic cardioverter/defibrillator) present    Dr Lovena Le office visit yearly, MDT   . Arthritis    cane  . Asthma   . Benign neoplasm of colon   . CAD (coronary artery disease)   . Cardiomyopathy   . Cataract   . Complication of anesthesia    pt states that he got a rash  . Constipation   . 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 DM (diabetes mellitus) ([place])    TYPE 2   . GERD (gastroesophageal reflux disease)   . Glaucoma    right eye  . HLD (hyperlipidemia)   . HTN (hypertension)    pt denies 08/19/12  . Hyperplasia, prostate   . Hyperthyroidism    thyroid lobectomy  . MI (myocardial infarction) [facility])    Dr Stanford Breed 2000, x3vessels bypass  . Nephrolithiasis   . OSA (obstructive sleep apnea)    AHI-28,on CPAP, noncompliant with CPAP  . Parkinson disease ([place])    1999  .  PONV (postoperative nausea and vomiting)   . Restless legs   . Shortness of breath    Hx: of at all times  . Sleep apnea    does not use CPAP  . Sleep apnea, organic   . UTI (lower urinary tract infection) 09/15/12   Klebsiella  . Ventral hernia     Past Surgical History:  Procedure Laterality Date  . 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 CARDIAC CATHETERIZATION    . CATARACT EXTRACTION W/ INTRAOCULAR LENS IMPLANT     Hx: of right eye  . CATARACT EXTRACTION  W/ INTRAOCULAR LENS IMPLANT Left 2018  . COLONOSCOPY N/A 10/13/2014   Procedure: COLONOSCOPY;  Surgeon: Jerene Bears, MD;  Location: [facility] ENDOSCOPY;  Service: Gastroenterology;  Laterality: N/A;  . COLONOSCOPY W/ BIOPSIES AND POLYPECTOMY     Hx: of  . CORONARY ARTERY BYPASS GRAFT  2000   [name], MD  . [place]  . DEEP BRAIN STIMULATOR PLACEMENT  2004   Right and left VIN stimulator placement (parkinsons)  . EYE SURGERY    . FINGER AMPUTATION     left pointer  . IMPLANTABLE CARDIOVERTER DEFIBRILLATOR IMPLANT N/A 11/13/2013   Procedure: IMPLANTABLE CARDIOVERTER DEFIBRILLATOR IMPLANT;  Surgeon: Evans Lance, MD;  Location: [facility] CATH LAB;  Service: Cardiovascular;  Laterality: N/A;  . INSERT / REPLACE / REMOVE PACEMAKER    . LEFT AND RIGHT HEART CATHETERIZATION WITH CORONARY ANGIOGRAM N/A 09/24/2013   Procedure: LEFT AND RIGHT HEART CATHETERIZATION WITH CORONARY ANGIOGRAM;  Surgeon: Burnell Blanks, MD;  Location: [facility] C[facility] LAB;  Service: Cardiovascular;  Laterality: N/A;  . LITHOTRIPSY     3 different times  . MEDIAN STERNOTOMY  2000  . SUBTHALAMIC STIMULATOR BATTERY REPLACEMENT N/A 09/05/2012   Procedure: Deep brain stimulator battery change;  Surgeon: Erline Levine, MD;  Location: [place] NEURO ORS;  Service: Neurosurgery;  Laterality: N/A;  Deep brain stimulator battery change  . SUBTHALAMIC STIMULATOR BATTERY REPLACEMENT N/A 06/10/2015   Procedure: Deep Brain stimulator battery change;  Surgeon: Erline Levine, MD;  Location: [place] NEURO ORS;  Service: Neurosurgery;  Laterality: N/A;  . TONSILLECTOMY      Family History  Problem Relation Age of Onset  . Aneurysm Mother   . Alcoholism Father   . HIV/AIDS Brother 48       AIDS  . Healthy Child   . Peripheral vascular disease Unknown   . Arthritis Unknown   . Healthy Child   . Healthy Child   . Diabetes Neg Hx   . Heart disease Neg Hx   . Colon cancer Neg Hx   . Prostate cancer Neg Hx     Social History:   reports that he has never smoked. He has never used smokeless tobacco. He reports that he drinks alcohol. He reports that he does not use drugs.    Review of Systems     Most recent eye exam was done in 2/18    HYPERLIPIDEMIA:  He is on Crestor with good control, has low HDL Triglycerides are better with starting back on Lovaza in October       Lab Results  Component Value Date   CHOL 123 04/04/2017   CHOL 111 10/16/2016   CHOL 132 12/06/2015   Lab Results  Component Value Date   HDL 32.30 (L) 04/04/2017   HDL 27.80 (L) 10/16/2016   HDL 30.20 (L) 12/06/2015   Lab Results  Component Value Date  LDLCALC 118 (H) 09/16/2015   LDLCALC 126 (H) 04/08/2013   LDLCALC 51 02/28/2012   Lab Results  Component Value Date   TRIG 232.0 (H) 04/04/2017   TRIG 287.0 (H) 10/16/2016   TRIG 217.0 (H) 12/06/2015   Lab Results  Component Value Date   CHOLHDL 4 04/04/2017   CHOLHDL 4 10/16/2016   CHOLHDL 4 12/06/2015   Lab Results  Component Value Date   LDLDIRECT 61.0 04/04/2017   LDLDIRECT 52.0 10/16/2016   LDLDIRECT 69.0 12/06/2015                  Thyroid:   He has had hypothyroidism following radioactive iodine treatment for hyperthyroidism in 2013  TSH is usually normal with  dosage of 125 g   Lab Results  Component Value Date   TSH 3.62 02/05/2017   TSH 2.35 03/29/2016   TSH 3.58 09/16/2015   FREET4 0.74 02/05/2017   FREET4 0.82 08/20/2014   FREET4 0.98 11/20/2011       The blood pressure has been followed by  other physicians Also on multiple diuretics from cardiologist, not recently needing Zaroxolyn  CKD: Renal dysfunction patterns as follows:   Lab Results  Component Value Date   CREATININE 2.02 (H) 04/04/2017   CREATININE 2.00 (H) 03/29/2017   CREATININE 1.77 (H) 02/18/2017     Diabetic foot exam in 03/2016 showed  normal monofilament sensation in the toes except decreased on the right great toe and plantar surfaces, no skin lesions or ulcers on the  feet and normal pedal pulses    Physical Examination:  BP 96/62 (BP Location: Left Arm, Patient Position: Sitting, Cuff Size: Large)   Pulse 84   Ht '5\' 8"'$  (1.727 m)   Wt 263 lb (119.3 kg)   SpO2 93%   BMI 39.99 kg/m      ASSESSMENT/PLAN  Diabetes type 2, uncontrolled with obesity    See history of present illness for detailed discussion of his current management, blood sugar patterns and problems identified  His recent blood sugars are averaging well over 200 and they are high throughout the day and night Only when he is not snacking his sugars may come down to about 160 He is still waiting for consultation with the dietitian He is not able to control his snacks and portions despite taking Victoza and this is his main problem, not able to reduce carbohydrate intake He appears to be getting more insulin resistant and likely getting glucose toxicity also  RENAL dysfunction: This appears to be somewhat worse, he does think he is followed by a nephrologist  Recommendations:   Since his renal function is worse he will stop his metformin  Start LANTUS in the evening 10 units once a day  Showed him and his wife a titration worksheet to adjust the dose at least about once a week by 2 units to get morning sugars down under at least 140  Cut back sharply on sweets and snacks and portions of fruits  He will keep a record of all he is eating along with snacks in preparation for his consultation with dietitian  Continue Victoza since Kirke Shaggy is not covered by insurance  Discussed that his blood sugars are about double normal and his postprandial readings should not be over 180s at the most  He refuses to consider the Omnipod pump because it would be too complicated for him, this was shown to him   Consider using the Omnipod pump on the next visit if  he is still interested but unlikely he can follow instruis  LIPIDS: LDL and triglycerides controlled, has persistently low HDL from  diabetic dyslipidemia  Hypothyroidism: Will have TSH on the next visit  Patient Instructions  Cut back on Snacks, carbs and fruits  Keep food record  lantus 10 units in pm and go up weekly till am sugar <140  No metformin    t   Elayne Snare 04/05/2017, 2:24 PM   Note: This office note was prepared with Dragon voice recognition system technology. Any transcriptional errors that result from this process are unintentional.

## 2017-04-09 ENCOUNTER — Encounter: Payer: Self-pay | Admitting: Family Medicine

## 2017-04-09 DIAGNOSIS — M79604 Pain in right leg: Secondary | ICD-10-CM | POA: Diagnosis not present

## 2017-04-09 DIAGNOSIS — M79605 Pain in left leg: Secondary | ICD-10-CM | POA: Diagnosis not present

## 2017-04-09 DIAGNOSIS — H4030X1 Glaucoma secondary to eye trauma, unspecified eye, mild stage: Secondary | ICD-10-CM | POA: Diagnosis not present

## 2017-04-09 DIAGNOSIS — H40032 Anatomical narrow angle, left eye: Secondary | ICD-10-CM | POA: Diagnosis not present

## 2017-04-09 DIAGNOSIS — M545 Low back pain: Secondary | ICD-10-CM | POA: Diagnosis not present

## 2017-04-09 LAB — HM DIABETES EYE EXAM

## 2017-04-10 ENCOUNTER — Ambulatory Visit (INDEPENDENT_AMBULATORY_CARE_PROVIDER_SITE_OTHER): Payer: Medicare Other | Admitting: Family Medicine

## 2017-04-10 ENCOUNTER — Encounter: Payer: Self-pay | Admitting: Family Medicine

## 2017-04-10 ENCOUNTER — Other Ambulatory Visit: Payer: Self-pay

## 2017-04-10 ENCOUNTER — Telehealth: Payer: Self-pay | Admitting: Endocrinology

## 2017-04-10 VITALS — BP 124/70 | HR 75 | Temp 97.6°F | Wt 262.5 lb

## 2017-04-10 DIAGNOSIS — Z23 Encounter for immunization: Secondary | ICD-10-CM | POA: Diagnosis not present

## 2017-04-10 DIAGNOSIS — N183 Chronic kidney disease, stage 3 unspecified: Secondary | ICD-10-CM

## 2017-04-10 DIAGNOSIS — G20 Parkinson's disease: Secondary | ICD-10-CM

## 2017-04-10 DIAGNOSIS — E7800 Pure hypercholesterolemia, unspecified: Secondary | ICD-10-CM

## 2017-04-10 DIAGNOSIS — I5022 Chronic systolic (congestive) heart failure: Secondary | ICD-10-CM

## 2017-04-10 DIAGNOSIS — I255 Ischemic cardiomyopathy: Secondary | ICD-10-CM | POA: Diagnosis not present

## 2017-04-10 DIAGNOSIS — Z7189 Other specified counseling: Secondary | ICD-10-CM

## 2017-04-10 MED ORDER — INSULIN PEN NEEDLE 32G X 5 MM MISC: 2 refills | Status: DC

## 2017-04-10 NOTE — Progress Notes (Signed)
He is seeing endo in the meantime.  He is going to start lantus.  Recent with 250s on sugar checks.  I'll defer to endocrine.  He had recent eye exam done.  Deaf R ear.  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  He uses a hearing aid in the L ear now.    Elevated Cholesterol: Using medications without problems:yes Muscle aches: not likely from statin.   Diet compliance: encouraged.  "Diet hasn't been too good," has f/u with nutrition pending.  Exercise: in PT.   Labs d/w pt.   Chronic kidney disease with creatinine around 2.  This is his recent baseline.  Discussed with patient.  Complicated by diuretic use given his cardiac situation.  Discussed with patient about cardio renal considerations.  History of Parkinson's.  Seeing Dr. Carles Collet.  Walking with a cane.  He is in physical therapy.  No falls.  Advance directive- wife designated if patient were incapacitated.  PPSV23 - done at [place].  D/w pt.    Meds, vitals, and allergies reviewed.   ROS: Per HPI unless specifically indicated in ROS section   GEN: nad, alert and oriented, flat affect noted.  HEENT: mucous membranes moist, B TM and canals wnl.   NECK: supple w/o LA CV: rrr PULM: ctab, no inc wob ABD: soft, +bs EXT: trace BLE edema SKIN: no acute rash Slow gait at baseline.   Diabetic foot exam: Normal inspection No skin breakdown No calluses  Normal DP pulses Normal sensation to light touch and monofilament Nails normal

## 2017-04-10 NOTE — Telephone Encounter (Signed)
This has been done.

## 2017-04-10 NOTE — Patient Instructions (Signed)
Let me talk to Dr. Carles Collet about your hearing changes in the left ear and cardiology about your kidney function.  Continue as is for now.  Take care.  Glad to see you.  Plan on recheck in about 6 months, sooner if needed.

## 2017-04-10 NOTE — Telephone Encounter (Signed)
Patient need prescription for needles send to Pharmacy:  [id number] - [place], [place] #:  [id number]    Pharmacy Comments:  --

## 2017-04-12 DIAGNOSIS — N183 Chronic kidney disease, stage 3 unspecified: Secondary | ICD-10-CM | POA: Insufficient documentation

## 2017-04-12 DIAGNOSIS — M79605 Pain in left leg: Secondary | ICD-10-CM | POA: Diagnosis not present

## 2017-04-12 DIAGNOSIS — M79604 Pain in right leg: Secondary | ICD-10-CM | POA: Diagnosis not present

## 2017-04-12 DIAGNOSIS — M545 Low back pain: Secondary | ICD-10-CM | POA: Diagnosis not present

## 2017-04-12 NOTE — Assessment & Plan Note (Signed)
He had a concern about the possibility of acoustic neuroma on the left side affecting his hearing loss.  I want to talk to neurology about this before I do anything else.  He agrees.

## 2017-04-12 NOTE — Assessment & Plan Note (Signed)
Continue statin.  Able to tolerate medication.  Discussed with patient about diet and exercise.

## 2017-04-12 NOTE — Assessment & Plan Note (Signed)
Advance directive- wife designated if patient were incapacitated.  

## 2017-04-12 NOTE — Assessment & Plan Note (Signed)
See above regarding creatinine.  His lungs are clear.  I want to talk to cardiology.  I did not change his medications at this point. >25 minutes spent in face to face time with patient, >50% spent in counselling or coordination of care, discussing cardiorenal considerations, etc.

## 2017-04-12 NOTE — Assessment & Plan Note (Signed)
Chronic kidney disease with creatinine around 2.  This is his recent baseline.  Discussed with patient.  Complicated by diuretic use given his cardiac situation.  Discussed with patient about cardio renal considerations.

## 2017-04-13 ENCOUNTER — Encounter: Payer: Medicare Other | Attending: Ophthalmology | Admitting: Dietician

## 2017-04-13 ENCOUNTER — Encounter: Payer: Self-pay | Admitting: Dietician

## 2017-04-13 DIAGNOSIS — E1165 Type 2 diabetes mellitus with hyperglycemia: Secondary | ICD-10-CM | POA: Insufficient documentation

## 2017-04-13 DIAGNOSIS — Z794 Long term (current) use of insulin: Secondary | ICD-10-CM | POA: Diagnosis not present

## 2017-04-13 DIAGNOSIS — E1159 Type 2 diabetes mellitus with other circulatory complications: Secondary | ICD-10-CM

## 2017-04-13 DIAGNOSIS — Z713 Dietary counseling and surveillance: Secondary | ICD-10-CM | POA: Diagnosis not present

## 2017-04-13 NOTE — Progress Notes (Signed)
Diabetes Self-Management Education  Visit Type:    Appt. Start Time: 1640 Appt. End Time: 9629  04/13/2017  Henry [name], identified by name and date of birth, is a 67 y.o. male with a diagnosis of Diabetes: Type 2. Patient was last seen by me 03/12/14.  History includes Parkinson's, CHF, HTN, hyperlipidemia, CKD (BMW41 March 2019), OSA, hypothyroidism, and constipaiton.  He only sleeps 2-3 hours at a time and does not sleep well at night.  He is up to the kitchen all night.  He eats increased snacks throughout the day.  May not eat lunch but multiple snacks-whatever he sees.  He sometimes does not remember that he just ate a meal.  Patient seems not to have control of these habits although encouraged him to avoid snacking at night.  Medications include Humalog via L-KG40 with 4 clicks for each meal and 1-2 clicks per snack.  Lantus 10 units daily and to increase each week until am blood glucose is less than 140.  Metformin was stopped due to kidney function.  Patient's daughter, son-in-law, and grandson moved in with patient and wife to assist in patient's care.  Wife and daughter shop and cook.  Their daughter cooks very healthfully.  Patient is retired from the [employer].    ASSESSMENT  Weight 268 lb (121.6 kg). Body mass index is 40.75 kg/m.  Diabetes Self-Management Education - 04/13/17 1359      Initial Visit   Diabetes Type  Type 2    Are you currently following a meal plan?  No    Are you taking your medications as prescribed?  Yes    Date Diagnosed  2000      Health Coping   How would you rate your overall health?  Poor      Psychosocial Assessment   Patient Belief/Attitude about Diabetes  Defeat/Burnout    Self-care barriers  Debilitated state due to current medical condition    Self-management support  Doctor's office;Family    Other persons present  Patient    Patient Concerns  Nutrition/Meal planning;Weight Control;Glycemic Control    Special Needs  None    Preferred Learning Style  No preference indicated    Learning Readiness  Contemplating    How often do you need to have someone help you when you read instructions, pamphlets, or other written materials from your doctor or pharmacy?  1 - Never      Pre-Education Assessment   Patient understands the diabetes disease and treatment process.  Needs Review    Patient understands incorporating nutritional management into lifestyle.  Needs Review    Patient undertands incorporating physical activity into lifestyle.  Needs Review    Patient understands using medications safely.  Needs Review    Patient understands monitoring blood glucose, interpreting and using results  Needs Review    Patient understands prevention, detection, and treatment of acute complications.  Needs Review    Patient understands prevention, detection, and treatment of chronic complications.  Needs Review    Patient understands how to develop strategies to address psychosocial issues.  Needs Review    Patient understands how to develop strategies to promote health/change behavior.  Needs Review      Complications   Last HgB A1C per patient/outside source  8.3 % 02/05/17    How often do you check your blood sugar?  3-4 times/day    Fasting Blood glucose range (mg/dL)  130-179;180-200    Postprandial Blood glucose range (mg/dL)  >200  Number of hypoglycemic episodes per month  0    Number of hyperglycemic episodes per week  6    Can you tell when your blood sugar is high?  Yes    Have you had a dilated eye exam in the past 12 months?  Yes    Have you had a dental exam in the past 12 months?  Yes    Are you checking your feet?  Yes    How many days per week are you checking your feet?  7      Dietary Intake   Breakfast  oatmeal, 1 piece cake, 1 cup milk OR pancakes, homemade jelly or honey or jam 4 small dill pickles, V-8 vegetable juice, e eggs, cheese OR omelet with cheese    Snack (morning)  occasional PB sandwich or  NABS; can of vienna sausage, apple 3-4 am    Lunch  skips or 1 reuben sandwich and ICEE    Snack (afternoon)  Little debbie Oatmeal cookies OR chips OR boiled eggs OR fruit or all of the above.    Dinner  Shrimp,mexican scampi, vegetables OR Prime Rib, Sweet Potato sticks,     Snack (evening)  chips OR chicken brown rice, 4 chocolate cookies    Beverage(s)  sprite zero and water      Exercise   Exercise Type  Light (walking / raking leaves) 100 yards walking per day, plus PT    How many days per week to you exercise?  2    How many minutes per day do you exercise?  15    Total minutes per week of exercise  30      Patient Education   Previous Diabetes Education  Yes (please comment) 2016    Nutrition management   Role of diet in the treatment of diabetes and the relationship between the three main macronutrients and blood glucose level;Meal options for control of blood glucose level and chronic complications.    Physical activity and exercise   Helped patient identify appropriate exercises in relation to his/her diabetes, diabetes complications and other health issue.    Medications  Reviewed patients medication for diabetes, action, purpose, timing of dose and side effects.    Psychosocial adjustment  Worked with patient to identify barriers to care and solutions    Personal strategies to promote health  Lifestyle issues that need to be addressed for better diabetes care      Individualized Goals (developed by patient)   Nutrition  General guidelines for healthy choices and portions discussed    Physical Activity  Exercise 3-5 times per week;15 minutes per day    Medications  take my medication as prescribed    Monitoring   test my blood glucose as discussed    Problem Solving  avoiding snacking    Reducing Risk  examine blood glucose patterns    Health Coping  discuss diabetes with (comment) MD, RD, CDE, family      Post-Education Assessment   Patient understands the diabetes disease  and treatment process.  Demonstrates understanding / competency    Patient understands incorporating nutritional management into lifestyle.  Demonstrates understanding / competency    Patient undertands incorporating physical activity into lifestyle.  Demonstrates understanding / competency    Patient understands using medications safely.  Demonstrates understanding / competency    Patient understands monitoring blood glucose, interpreting and using results  Demonstrates understanding / competency    Patient understands prevention, detection, and treatment of acute complications.  Demonstrates understanding / competency    Patient understands prevention, detection, and treatment of chronic complications.  Demonstrates understanding / competency    Patient understands how to develop strategies to address psychosocial issues.  Needs Review    Patient understands how to develop strategies to promote health/change behavior.  Needs Review      Outcomes   Expected Outcomes  Demonstrated interest in learning. Expect positive outcomes    Future DMSE  PRN    Program Status  Completed       Individualized Plan for Diabetes Self-Management Training:   Learning Objective:  Patient will have a greater understanding of diabetes self-management. Patient education plan is to attend individual and/or group sessions per assessed needs and concerns.   Plan:   Patient Instructions  Avoid eating in the middle of the night. Avoid skipping meals. Aim to have breakfast, lunch, and dinner each day.   Choose a lot of vegetables each day. Choose beans (black beans, pintos, edemame, chick peas etc.) daily. The fiber with adequate fluid can help with constipation and is found in all of the plant foods.  Keep snack food out of sight.    Have a small amount of protein with each meal and snack.  Walk with someone to avoid falls.  Water and V-8 vegetable juice are healthy drinks.  Breakfast ideas:  1-2 eggs,  2 slices [employer] toast with margarine, 1% milk, fruit  1-2 eggs, 1 slice [employer] toast with margarine, 1% milk, fruit  2 packs of low sugar oatmeal, 1 slice toast with Peanut Butter or cheese, fruit      Expected Outcomes:  Demonstrated interest in learning. Expect positive outcomes  Education material provided: Meal plan card, snack list, parkinson's and nutrition, parkinson's resource sheet  If problems or questions, patient to contact team via:  Phone  Future DSME appointment: PRN

## 2017-04-13 NOTE — Patient Instructions (Signed)
Avoid eating in the middle of the night. Avoid skipping meals. Aim to have breakfast, lunch, and dinner each day.   Choose a lot of vegetables each day. Choose beans (black beans, pintos, edemame, chick peas etc.) daily. The fiber with adequate fluid can help with constipation and is found in all of the plant foods.  Keep snack food out of sight.    Have a small amount of protein with each meal and snack.  Walk with someone to avoid falls.  Water and V-8 vegetable juice are healthy drinks.  Breakfast ideas:  1-2 eggs, 2 slices [employer] toast with margarine, 1% milk, fruit  1-2 eggs, 1 slice [employer] toast with margarine, 1% milk, fruit  2 packs of low sugar oatmeal, 1 slice toast with Peanut Butter or cheese, fruit

## 2017-04-16 ENCOUNTER — Telehealth: Payer: Self-pay | Admitting: Family Medicine

## 2017-04-16 DIAGNOSIS — R7989 Other specified abnormal findings of blood chemistry: Secondary | ICD-10-CM

## 2017-04-16 DIAGNOSIS — M545 Low back pain: Secondary | ICD-10-CM | POA: Diagnosis not present

## 2017-04-16 DIAGNOSIS — M79605 Pain in left leg: Secondary | ICD-10-CM | POA: Diagnosis not present

## 2017-04-16 DIAGNOSIS — M79604 Pain in right leg: Secondary | ICD-10-CM | POA: Diagnosis not present

## 2017-04-16 NOTE — Telephone Encounter (Signed)
Please call pt. I checked with Dr. Stanford Breed.  I wouldn't change his diuretics at this point.  We both this it is reasonable for him to see the kidney clinic.  I realize he has a lot of appointments but I would still try to go through with this if possible.  I put in the referral.  Thanks.

## 2017-04-16 NOTE — Telephone Encounter (Signed)
Patient advised.

## 2017-04-17 ENCOUNTER — Encounter: Payer: Self-pay | Admitting: Endocrinology

## 2017-04-18 ENCOUNTER — Other Ambulatory Visit: Payer: Self-pay

## 2017-04-18 MED ORDER — INSULIN LISPRO 100 UNIT/ML ~~LOC~~ SOLN: SUBCUTANEOUS | 2 refills | Status: DC

## 2017-04-18 NOTE — Progress Notes (Signed)
Henry [name] was seen today in the movement disorders clinic for neurologic consultation at the request of [name].  His PCP is Tonia Ghent, MD.  The consultation is for the evaluation of PD and to manage his DBS.  He is accompanied by his wife who supplements the history.  The first symptom(s) the patient noticed was right hand tremor in 1999.  He was seen by neurology and was dx with PD.  He was placed on something, but it caused sleepiness.  He does not think that he has ever been on levodopa.   He was placed on Mirapex, which seemed to help.  He only takes it twice per day.  He didn't think it made a difference when he took it tid.  He began to have tremor and it was suggested he do DBS.    The pt is s/p stn DBS in 2005.  He had a battery change in 2009.    10/02/12 update: The patient is accompanied today by his daughter, who supplements the history.  I reviewed medical records available to me since last visit.  The patient had his generator changed on 09/05/2012.  He remains on pramipexole, 0.5 mg twice per day.  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 [name] in that regard.  He is not compliant with CPAP so says that he doesn't want to return to her for f/u. He doesn't take the klonopin faithfully.  10/11/12 update:  Pt was seen as a walk in/work in today.  Pt had increasing tremor, L greater than R for 3 days.  Thinks that it started with the d/c of artane.  Speech stable.  11/19/12 update:  Pt is seen today for his PD, accompanied by his daughter who supplements the hx.    He is currently on klonopin 0.5 mg - 1/2-1 tablet q hs.  He only takes it when his wife is not working.  She works nights and is only in 2 days per week.  He has some reluctance to take it other nights.  He is on pramipexole 0.5 mg bid.  Last visit, his DBS was reset more similar to the settings he had prior to coming here, just with an increased voltage.  About 2 weeks after our last  visit, the patient decided to go back on the Artane.  The combination of the Artane and the DBS changes helped significantly.  He does ask if I can slightly increased voltage on the left hand, as he has some tremor at night that is bothersome.  Otherwise, he is doing well.  He feels that his balance has been great.  02/19/13 update:  This patient is accompanied in the office by his child who supplements the history.  The pt has a hx of PD.  He has been tremor free and is very happy about that.  He has had some increased balance loss; the last fall was a few months ago but he hasn't gotten hurt.  Was considering hernia surgery.  The records that were made available to me were reviewed.  He is holding on that for now but plans to have it done before end of year.  No hallucinations.  He is still having some acting out of the dreams, despite clonazepam.  06/19/13 update:  Patient is returning to followup regarding his Parkinson's disease.  I had the opportunity to review records since last visit.  He went to the emergency room on  05/12/2013 with chest pain and shortness of breath.  He had been fishing and had missed several doses of Lasix.  He had been eating seafood.  He ended up with an acute exacerbation of his chronic congestive heart failure.  Once he was diuresed and back on his medications, he is feeling better.  He just had a nuclear medicine study done on 06/17/2013 and the ejection fraction on this looked better than on his echocardiogram.  The left ventricular ejection fraction was 41%.  Prior to that, there was some concern that his left ventricular ejection fraction had dropped so much that he may need an ICD.  In terms of Parkinson's disease, the patient states that he has been doing very well in terms of tremor, but his walking really has deteriorated.  He describes a festinating gait, with much more shuffling.  He fell walking over his dog gate but otherwise has not had falls.  He went fishing  yesterday and states that he "stumbled all day long."  No hallucinations.  He has been trying to be more faithful with his CPAP. He remains on Mirapex 0.5 mg twice a day, Artane 2 mg twice a day.  10/23/13 update:  The patient returns today to the clinic, accompanied by his wife who supplements the history.  From a Parkinson's standpoint, the patient states that he has been doing very well.  No tremor.  He started on levodopa last visit and states that he has had no falls and overall stumbling has been much better.  He states that the only time that he stumbles is if he is inside of his fishing boat, and states that that is really because it is small and unstable.  He is taking his levodopa in the morning, after lunch and bedtime.  He has had no hallucinations.  His wife states that he is still draining and acting out the dreams and even fell out of bed a few days ago.  However, he is taking his clonazepam about 10 PM but doesn't go to bed until 1 AM.  He is not using his CPAP.  Is having significant constipation.   I reviewed his cardiology records since last visit.  He did have a heart catheterization and is scheduled to have an ICD placed on November 5.  02/23/14 update:  Pt returns for follow up accompanied by his wife who supplements the history.  The records that were made available to me were reviewed since last visit.  He had an ICD placed on 11/15/13.  He is doing well.  Taking carbidopa/levodopa 25/100 three times a day and remains on artane as well as mirapex 0.5 mg bid.  Had a fall up the stairs.  Wife describes festinating gate and pt thinks that levodopa contributes.  He also doesn't feel good much of the time and thinks that is from levodopa.  Admits, however, that when BS under good control, he feels good.  Has started seeing endocrinology and started on new meds.  He has klonopin for RBD.  Still vivid dreams but no falling out bed (in recliner now).  Having some word finding trouble and memory  doesn't seem clear.  Also, put back tailgate down on car and came down on generator and wants me to look at that.    07/01/14 update:  The patient follows up today regarding his Parkinson's disease.  He is accompanied by his wife who supplements the history.  Last visit, the patient was complaining about memory change which  I thought was likely multifactorial and due to medication such as Artane, Mirapex and Ultram, which he was taking every 3 hours.  The patient, however, thought that it was from levodopa and so we held it and the patient reported that he felt better and therefore continued to stay off of the medication.  He reports that falls are better after d/c levodopa but wife reports still falls.  Pt states that he only fell in shower after he d/c levodopa.  His wife does admit that she thinks that levodopa was causing loss of balance.  He is having more tremor over the last month on the L hand.   He remains on clonazepam 0.5 mg, 1-1/2 tablets at night for REM behavior disorder.  I reviewed records since last visit.  He has seen Dr. Dwyane Dee multiple times in regards to his uncontrolled diabetes.  He started on insulin on May 22 2014.  He states that he is doing well with injecting himself.  Having a lot of constipation.  Has the rancho recipe.    11/03/14 update:  The patient is following up today, accompanied by his wife who supplements the history.  Records were reviewed since our last visit.  The patient is on pramipexole 0.5 mg twice a day and Artane 2 mg twice a day.  He has not had any hallucinations.  Had a fall the other day walking up stairs carrying something and fell backward.  Hit his back.  No LOC.  Also fell backward in the summer around the pool.  [place] one time out of his boat.  Doesn't want to use a walker. He was in the emergency room recently with a rash that was felt secondary to insect bites that he got in the woods.  He also recently had a colonoscopy.  His diabetes is under better  control and his last A1c was 7.2.  States that he now has an insulin pump.  He remains on clonazepam 0.5 mg, 1-1/2 tablets at night for REM behavior disorder.  More back pain lately and will start injections for the back which have helped previously  02/04/15 update:  The patient presents today, accompanied by his wife who supplements the history.  I have reviewed prior records made available to me.  He has established a new primary care doctor in Dr. Damita Dunnings.  The patient remains on Artane, 2 mg twice a day as well as pramipexole 0.5 mg twice a day.  Overall, he has been doing fairly well.  He occasionally still has some tremor.  He had 2 falls since our last visit; he states that he was pushing a gate open and it came back and hit him.  He was also carrying wood the other day and fell forward with wood.  No hallucinations.  No lightheadedness or near syncope.  He remains on clonazepam 0.5 mg, 1-1/2 tablets at night for REM behavior disorder.  [facility] at [facility]    06/08/15 update:  This patient is accompanied in the office by his spouse who supplements the history.  The patient remains on Artane, 2 mg twice a day as well as pramipexole 0.5 mg twice a day.  He tripped over the curb at [employer].  He tripped over the step on his deck.   He remains on clonazepam 0.5 mg, 1-1/2 tablets at night for REM behavior disorder.  He is still having trouble with that.  He fell out of bed the other night.  Wife asks about bed rails.   He felt that [facility] helped.  Is looking forward to getting in his pool.    09/09/15 update:  The patient follows up today, accompanied by his wife who supplements the history.  He is on pramipexole 0.5 g twice a day and trihexyphenidyl 2 mg twice a day.  Didn't go up to the 1.5 mg of pramipexole.   He is on clonazepam 0.5 mg, 1-1/2 tablets at night for REM behavior disorder.  His biggest issue is RLS at bedtime.  It is preventing him from going to sleep.    He denies any hallucinations.  He denies cognitive change.  He continues to struggle with balance and has fallen/stumbled a few times.  He has fallen in the boat.  Wife describes festinating gait.  Wife also describes crying frequently.  Denies depression. He has his battery changed on 06/10/15  12/18/15 update:  Pt is following up today, accompanied by his wife who supplements the history.  Last visit, I changed him from pramipexole, 0.5 mg twice per day to Mirapex ER, 1.5 mg once per day.  He states that it is very expensive.  He remains on trihexyphenidyl, 2 mg twice per day.  He is also on clonazepam 0.5 mg, 1-1/2 tablets at night for REM behavior disorder. It turns out he was taking this at 6pm.   I reviewed records since last visit.  He reports that his pharmacist to come off his Aldactone because of an interaction with his Parkinson's medicine.  I am unaware of any interaction with his current medications.  Been having issues with constipation, which has been treated by Dr. Damita Dunnings.  [facility] is in [facility].  When asked about falls, his wife states that he falls all the time but he is more conscious of festinating gait now that he is in PT.  Not gotten hurt with falls.    07/13/16 update:  Pt f/u today for PD, accompanied by his wife, who supplements the history.  The records that were made available to me were reviewed since last visit.  On pramipexole ER 1.5 mg daily and artane 2 mg bid.  Was on klonopin for RBD but he d/c the medication.  He thought that it caused insomnia.  He is still acting out the dreams.  He won't use his CPAP machine.  States that he is afraid of it malfunctioning.  Often will sleep in the recliner.  Having more back pain.  Having injections and 3rd is next week at [facility].    Wearing off:  No.  How long before next dose:  n/a Falls:   Yes.  , 12 months but some of these were falls out of bed in RBD N/V:  No. Hallucinations:  Yes.   (none visual but few auditory and has  gotten out of bed to see who was there)  visual distortions: No. Lightheaded:  No.  Syncope: No. Dyskinesia:  No.   12/15/16 update: Patient seen today in follow-up for his Parkinson's disease.  He is accompanied by his wife who supplements the history.  He remains on pramipexole ER, 1.5 mg daily.  He is also on trihexyphenidyl, 2 mg.  He was taking bid but he looked at his bottle and realized he was supposed to be taking it qd and just started doing that.  He has not had any hallucinations, except a few arising in the middle of the night out of a vivid dream.  No compulsive  behaviors.  I have reviewed records made available to me from his other providers, including his endocrinologist, cardiologist and primary care physician.  Off of klonopin.  "If I take it I sleep the entire next day."  He is having vivid dreams.  States that he has not fallen that much until last night.  Wife describes festinating gait.  Having more trouble in the shower.  A shower chair will not fit in his shower.  They have a small corner seat, but he states that he cannot wash his feet if he sits in it.  It is too slippery.  He is a currently attending [facility] through [facility] for his low back pain.  He does like that.  04/20/17 update: Patient is seen today in follow-up for his Parkinson's disease.  Patient is accompanied by his wife who supplements the history.  Patient is on pramipexole, 1.5 mg daily.  He is also on Artane, 2 mg daily.  No hallucinations.  No lightheadedness or near syncope.  I have talked to his primary care physician about him.  Primary care wanted to get an MRI of his brain due to hearing loss. 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  Discussed with his primary care physician that if he needed MRI, his device is MRI compatible for head MRI, but would need [employer] rep there to turn off the device and make sure  impedances were normal the day of MRI. Has new hearing aid as of today and doing well with that.   [place] today and tried to get off of toilet and hit door knob and fell.  Still going fishing and friend helps him navigate the water.  Went yesterday. Having more renal dysfunction and urinary urgency and dysuria.  No UTI per urology and placed on myrbetriq.  Has referral to nephrology but has not heard from them yet.  PREVIOUS MEDICATIONS: Mirapex and artane, levodopa (made "too loose")  ALLERGIES:   Allergies  Allergen Reactions  . Penicillins Rash and Other (See Comments)    WEARS ALLERGY BRACELET Because of a history of documented adverse serious drug reaction;Medi Alert bracelet  is recommended Has patient had a PCN reaction causing immediate rash, facial/tongue/throat swelling, SOB or lightheadedness with hypotension: Yes Has patient had a PCN reaction causing severe rash involving mucus membranes or skin necrosis: unknown Has patient had a PCN reaction that required hospitalization NO Has patient had a PCN reaction occurring within the last 10 years: NO If all of   . Peanut-Containing Drug Products Other (See Comments)    cough  . Watermelon [Citrullus Vulgaris]     Tickle in throat and cough    CURRENT MEDICATIONS:   Allergies as of 04/20/2017      Reactions   Penicillins Rash, Other (See Comments)   WEARS ALLERGY BRACELET Because of a history of documented adverse serious drug reaction;Medi Alert bracelet  is recommended Has patient had a PCN reaction causing immediate rash, facial/tongue/throat swelling, SOB or lightheadedness with hypotension: Yes Has patient had a PCN reaction causing severe rash involving mucus membranes or skin necrosis: unknown Has patient had a PCN reaction that required hospitalization NO Has patient had a PCN reaction occurring within the last 10 years: NO If all of    Peanut-containing Drug Products Other (See Comments)   cough   Watermelon [citrullus  Vulgaris]    Tickle  in throat and cough      Medication List        Accurate as of 04/20/17 12:47 PM. Always use your most recent med list.          acetaminophen 325 MG tablet Commonly known as:  TYLENOL Take 1-2 tablets (325-650 mg total) by mouth every 4 (four) hours as needed for mild pain.   albuterol 108 (90 Base) MCG/ACT inhaler Commonly known as:  PROVENTIL HFA;VENTOLIN HFA Inhale 2 puffs into the lungs every 6 (six) hours as needed for wheezing or shortness of breath.   aspirin EC 81 MG tablet Take 81 mg by mouth daily.   betamethasone dipropionate 0.05 % cream Commonly known as:  DIPROLENE Apply topically 2 (two) times daily as needed.   Fluticasone-Salmeterol 250-50 MCG/DOSE Aepb Commonly known as:  ADVAIR Inhale 1 puff into the lungs 2 (two) times daily.   furosemide 80 MG tablet Commonly known as:  LASIX TAKE 1 & 1/2 TABLETS DAILY   gabapentin 100 MG capsule Commonly known as:  NEURONTIN Take 1-2 capsules (100-200 mg total) by mouth 3 (three) times daily as needed.   ibuprofen 200 MG tablet Commonly known as:  ADVIL,MOTRIN Take 800 mg by mouth every 6 (six) hours as needed (For pain.).   Insulin Glargine 100 UNIT/ML Solostar Pen Commonly known as:  LANTUS SOLOSTAR Inject 20 Units into the skin daily at 10 pm.   insulin lispro 100 UNIT/ML injection Commonly known as:  HUMALOG USE MAX 56 UNITS PER DAY   WITH V-GO PUMP   Insulin Pen Needle 32G X 5 MM Misc Commonly known as:  NOVOTWIST USE AS DIRECTED EVERY DAY   levothyroxine 125 MCG tablet Commonly known as:  SYNTHROID, LEVOTHROID TAKE 1 TABLET EVERY DAY DAILY BEFORE BREAKFAST   lisinopril 2.5 MG tablet Commonly known as:  PRINIVIL,ZESTRIL TAKE 1 TABLET BY MOUTH DAILY.   metolazone 2.5 MG tablet Commonly known as:  ZAROXOLYN Take 1 tablet (2.5 mg total) by mouth daily. PRN FOR > 3 LB IN A DAY OR >5 LB IN WEEK   metoprolol succinate 50 MG 24 hr tablet Commonly known as:  TOPROL-XL TAKE 1  TABLET BY MOUTH DAILY. TAKE WITH OR IMMEDIATELY FOLLOWING A MEAL.   multivitamin with minerals Tabs tablet Take 1 tablet by mouth daily.   MYRBETRIQ 50 MG Tb24 tablet Generic drug:  mirabegron ER Take 50 mg by mouth daily.   nitroGLYCERIN 0.4 MG SL tablet Commonly known as:  NITROSTAT Place 1 tablet (0.4 mg total) under the tongue every 5 (five) minutes as needed for chest pain.   ONETOUCH DELICA LANCETS 09U Misc Use to check blood sugar 2 times per day dx code E11.49   ONETOUCH VERIO test strip Generic drug:  glucose blood USE AS DIRECTED TO CHECK BLOOD SUGAR TWICE A DAY   potassium chloride SA 20 MEQ tablet Commonly known as:  KLOR-CON M10 Take 2 tablets (40 mEq total) by mouth daily.   Pramipexole Dihydrochloride 1.5 MG Tb24 TAKE 1 TABLET DAILY   rosuvastatin 20 MG tablet Commonly known as:  CRESTOR Take 1 tablet (20 mg total) daily by mouth.   traMADol 50 MG tablet Commonly known as:  ULTRAM TAKE 1 TABLET TWICE A DAY AS NEEDED   travoprost (benzalkonium) 0.004 % ophthalmic solution Commonly known as:  TRAVATAN Place 1 drop into the right eye at bedtime.   trihexyphenidyl 2 MG tablet Commonly known as:  ARTANE TAKE 1 TABLET BY MOUTH EVERY DAY  V-GO 40 Kit Use one pod daily with insulin   VICTOZA 18 MG/3ML Sopn Generic drug:  liraglutide INJECT 1.'8MG'$  ONCE DAILY   vitamin B-12 1000 MCG tablet Commonly known as:  CYANOCOBALAMIN Take 1,000 mcg by mouth daily.        PAST MEDICAL HISTORY:   Past Medical History:  Diagnosis Date  . AICD (automatic cardioverter/defibrillator) present    Dr Lovena Le office visit yearly, MDT   . Arthritis    cane  . Asthma   . Benign neoplasm of colon   . CAD (coronary artery disease)   . Cardiomyopathy   . Cataract   . Complication of anesthesia    pt states that he got a rash  . Constipation   . 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 DM (diabetes mellitus) ([place])    TYPE 2   . GERD  (gastroesophageal reflux disease)   . Glaucoma    right eye  . HLD (hyperlipidemia)   . HTN (hypertension)    pt denies 08/19/12  . Hyperplasia, prostate   . Hyperthyroidism    thyroid lobectomy  . MI (myocardial infarction) [facility])    Dr Stanford Breed 2000, x3vessels bypass  . Nephrolithiasis   . OSA (obstructive sleep apnea)    AHI-28,on CPAP, noncompliant with CPAP  . Parkinson disease ([place])    1999  . PONV (postoperative nausea and vomiting)   . Restless legs   . Shortness of breath    Hx: of at all times  . Sleep apnea    does not use CPAP  . Sleep apnea, organic   . UTI (lower urinary tract infection) 09/15/12   Klebsiella  . Ventral hernia     PAST SURGICAL HISTORY:   Past Surgical History:  Procedure Laterality Date  . 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 CARDIAC CATHETERIZATION    . CATARACT EXTRACTION W/ INTRAOCULAR LENS IMPLANT     Hx: of right eye  . CATARACT EXTRACTION W/ INTRAOCULAR LENS IMPLANT Left 2018  . COLONOSCOPY N/A 10/13/2014   Procedure: COLONOSCOPY;  Surgeon: Jerene Bears, MD;  Location: [facility] ENDOSCOPY;  Service: Gastroenterology;  Laterality: N/A;  . COLONOSCOPY W/ BIOPSIES AND POLYPECTOMY     Hx: of  . CORONARY ARTERY BYPASS GRAFT  2000   [name], MD  . [place]  . DEEP BRAIN STIMULATOR PLACEMENT  2004   Right and left VIN stimulator placement (parkinsons)  . EYE SURGERY    . FINGER AMPUTATION     left pointer  . IMPLANTABLE CARDIOVERTER DEFIBRILLATOR IMPLANT N/A 11/13/2013   Procedure: IMPLANTABLE CARDIOVERTER DEFIBRILLATOR IMPLANT;  Surgeon: Evans Lance, MD;  Location: [facility] CATH LAB;  Service: Cardiovascular;  Laterality: N/A;  . INSERT / REPLACE / REMOVE PACEMAKER    . LEFT AND RIGHT HEART CATHETERIZATION WITH CORONARY ANGIOGRAM N/A 09/24/2013   Procedure: LEFT AND RIGHT HEART CATHETERIZATION WITH CORONARY ANGIOGRAM;  Surgeon: Burnell Blanks, MD;  Location: [facility] CATH LAB;  Service: Cardiovascular;   Laterality: N/A;  . LITHOTRIPSY     3 different times  . MEDIAN STERNOTOMY  2000  . SUBTHALAMIC STIMULATOR BATTERY REPLACEMENT N/A 09/05/2012   Procedure: Deep brain stimulator battery change;  Surgeon: Erline Levine, MD;  Location: [place] NEURO ORS;  Service: Neurosurgery;  Laterality: N/A;  Deep brain stimulator battery change  . SUBTHALAMIC STIMULATOR BATTERY REPLACEMENT N/A 06/10/2015   Procedure: Deep Brain stimulator battery change;  Surgeon: Erline Levine, MD;  Location: [place] [facility]UR[facility];  Service: Neurosurgery;  Laterality: N/A;  . TONSILLECTOMY      SOCIAL HISTORY:   Social History   Socioeconomic History  . Marital status: Married    Spouse name: [name]  . Number of children: 2  . Years of education: Not on file  . Highest education level: Not on file  Occupational History  . Occupation: DISABLED    Comment: [employer], [employer]  Social Needs  . Financial resource strain: Not on file  . Food insecurity:    Worry: Not on file    Inability: Not on file  . Transportation needs:    Medical: Not on file    Non-medical: Not on file  Tobacco Use  . Smoking status: Never Smoker  . Smokeless tobacco: Never Used  Substance and Sexual Activity  . Alcohol use: Yes    Comment: occasional beer  . Drug use: No  . Sexual activity: Not on file  Lifestyle  . Physical activity:    Days per week: Not on file    Minutes per session: Not on file  . Stress: Not on file  Relationships  . Social connections:    Talks on phone: Not on file    Gets together: Not on file    Attends religious service: Not on file    Active member of club or organization: Not on file    Attends meetings of clubs or organizations: Not on file    Relationship status: Not on file  . Intimate partner violence:    Fear of current or ex partner: Not on file    Emotionally abused: Not on file    Physically abused: Not on file    Forced sexual activity: Not on file  Other Topics Concern  . Not on file  Social  History Narrative   From [facility]   Retired/disability Clinical research associate   Likes to fish.     Married 1972   3 kids   [place] fan    FAMILY HISTORY:   Family Status  Relation Name Status  . Mother  Deceased       complications of surgery  . Father  Deceased  . Brother  Alive       1 brother, 1 half brother  . Sister  Alive       2 half sisters  . Child  Alive  . Unknown  (Not Specified)  . MGM  Deceased  . MGF  Deceased  . PGM  Deceased  . PGF  Deceased  . Sister  Alive  . Brother  Alive  . Child  Alive  . Child  Alive  . Neg Hx  (Not Specified)    ROS:  A complete 10 system review of systems was obtained and was unremarkable apart from what is mentioned above.  PHYSICAL EXAMINATION:    VITALS:   Vitals:   04/20/17 1244  BP: 122/68  Weight: 261 lb (118.4 kg)    GEN:  The patient appears stated age and is in NAD. HEENT:  Normocephalic, atraumatic.  The mucous membranes are moist. The superficial temporal arteries are without ropiness or tenderness. CV:  RRR Lungs:  CTAB Neck/HEME:  There are no carotid bruits bilaterally.   Neurological examination:  Orientation: The patient is alert and oriented x3.  Cranial nerves: There is good facial symmetry.  The visual fields are full to confrontational testing. The speech is fluent and dysarthric.  He  is hypophonic. Soft palate rises symmetrically and there is no tongue deviation. Hearing is intact to conversational tone. Motor: Strength is at least antigravity x4.  Grip strength is good and equal bilaterally.  Shoulder shrug is equal and symmetric.  There is no pronator drift.  Movement examination: Tone: There is normal tone in the UE and LE bilaterally Abnormal movements: There is no tremor today Coordination:  There is slight decremation with alternation of supination/pronation of the forearm on the left and toe taps on the left. Gait and Station: The patient pushes off of the chair.  He walks well today  with 4 pronged cane.  He is wide based and somewhat unsteady.  LABS  Lab Results  Component Value Date   WBC 7.8 04/04/2017   HGB 12.5 (L) 04/04/2017   HCT 36.3 (L) 04/04/2017   MCV 82.5 04/04/2017   PLT 151.0 04/04/2017   Lab Results  Component Value Date   TSH 3.62 02/05/2017     Chemistry      Component Value Date/Time   NA 137 04/04/2017 0902   NA 142 12/22/2016 1233   K 3.9 04/04/2017 0902   CL 92 (L) 04/04/2017 0902   CO2 35 (H) 04/04/2017 0902   BUN 35 (H) 04/04/2017 0902   BUN 33 (H) 12/22/2016 1233   CREATININE 2.02 (H) 04/04/2017 0902   CREATININE 1.53 (H) 05/22/2016 1106      Component Value Date/Time   CALCIUM 9.3 04/04/2017 0902   ALKPHOS 64 02/18/2017 1112   AST 15 02/18/2017 1112   ALT 19 02/18/2017 1112   BILITOT 0.7 02/18/2017 1112     Lab Results  Component Value Date   SWNIOEVO35 009 (H) 02/22/2017   Lab Results  Component Value Date   HGBA1C 8.3 (H) 02/05/2017   DBS programming was performed today, which is described in more detail on a separate programming procedural notes.   ASSESSMENT/PLAN:  1.  Idiopathic Parkinson's disease.    -He is now down to once per day Artane.  I would like to get him off of this medication altogether.  He was agreeable to hold for the weekend and we will call him on Monday.  -continue Mirapex ER 1.5 mg daily.  Told him that we may need to try and drop the dosage of this in the future for the same reason as the trihexyphenidyl, but for now we decided to continue him on that.  He does not think levodopa worked in the past and thinks it made him "too loose" and I told him we may need to retry it in the future.  Revisited this discussion again today.    -His DBS battery was last changed on 09/05/2012 and on 06/10/15.    -Told him that he really needs to use his walker at all times.  He is using it some but not all the time.  He is seeing PT with [facility] right now  -talked to him about importance of seeing derm.   Also discussed sunscreen as he is sunburnt today.  PD increases risk of melanoma and discussed this.   2.  RBD  -He stated that clonazepam really just makes him too sleepy the next day and give him a hangover effect.  Talked about safety in the bedroom.  Talked about using bed rails. 3.  Pseudobulbar affect (crying)  -Talked to him about the nature and pathophysiology.  Really does not want any medication for this.   4.  OSAS, noncompliant with CPAP.  -  Unfortunately, he has been unable to tolerate CPAP.  Talked about dental appliances, which he does not wish to try either. 5.  CHF  -ICD placed on 11/15/2013 6.  Constipation  -copy of rancho recipe given although he still has the problem.  Dr. Damita Dunnings managing.  Better with Miralax and dates. 7.  Uncontrolled DM  -Doing much better with insulin pump 8.  Hearing loss   -As above, I talked with primary care physician.  His device is MRI compatible for head with 1.5 Tesla magnet.  Needs to be coordinated with [employer] rep and radiology.  Device needs to be turned off and impedances checked the day of MRI.  He will arrange this through his primary care.  -hearing is better with hearing aids he got yesterday 9.  Renal insufficiency  -pt to have appointment with [facility].  Gave him contact information.  May need to adjust pramipexole if worsens. 10.  Follow up is anticipated in the next few months, sooner should new neurologic issues arise.  Much greater than 50% of this visit was spent in counseling and coordinating care.  Total face to face time:  25 min

## 2017-04-20 ENCOUNTER — Other Ambulatory Visit: Payer: Self-pay

## 2017-04-20 ENCOUNTER — Ambulatory Visit (INDEPENDENT_AMBULATORY_CARE_PROVIDER_SITE_OTHER): Payer: Medicare Other | Admitting: Neurology

## 2017-04-20 ENCOUNTER — Encounter: Payer: Self-pay | Admitting: Neurology

## 2017-04-20 VITALS — BP 122/68 | HR 72 | Resp 14 | Wt 261.0 lb

## 2017-04-20 DIAGNOSIS — I255 Ischemic cardiomyopathy: Secondary | ICD-10-CM | POA: Diagnosis not present

## 2017-04-20 DIAGNOSIS — G20 Parkinson's disease: Secondary | ICD-10-CM | POA: Diagnosis not present

## 2017-04-20 DIAGNOSIS — H9190 Unspecified hearing loss, unspecified ear: Secondary | ICD-10-CM | POA: Diagnosis not present

## 2017-04-20 DIAGNOSIS — G4752 REM sleep behavior disorder: Secondary | ICD-10-CM | POA: Diagnosis not present

## 2017-04-20 DIAGNOSIS — N289 Disorder of kidney and ureter, unspecified: Secondary | ICD-10-CM

## 2017-04-20 DIAGNOSIS — Z9689 Presence of other specified functional implants: Secondary | ICD-10-CM | POA: Diagnosis not present

## 2017-04-20 NOTE — Patient Instructions (Signed)
1.  Hold trihexyphenidyl over the weekend and we will call you Monday to see how you are doing  2.  [facility]:  Address: [address], [place], [place] [address] Phone: [phone]   [facility] Parkinson's [facility] Movement [facility]  The [facility] team know that living well with a movement disorder extends far beyond our clinic walls. We are together with you. Our team is passionate about providing resources to you and your loved ones who are living with Parkinson's disease and movement disorders. Participate in these programs and join our community. These resources are free or low cost!   [facility] is adding:   Innovative educational programs for patients and caregivers.   Support groups for patients and caregivers living with Parkinson's disease.   Parkinson's specific exercise programs.   Custom tailored therapeutic programs that will benefit patient's living with Parkinson's disease.   We are in this together. You can help and contribute to grow these programs and resources in our community. 100% of the funds donated to the [facility] stays right here in our community to support patients and their caregivers.  To make a tax deductible contribution:  -ask for a [facility] Parkinson's envelope in the office today.  - call the Office of Institutional Advancement at [phone].

## 2017-04-20 NOTE — Procedures (Signed)
DBS Programming was performed.    Total time spent programming was 8 minutes.  Device was confirmed to be on.  Soft start was confirmed to be on.  Impedences were checked and were within normal limits.  Battery was checked and was determined to be functionally normally and not near end of life (2.80).    Detailed analysis on separate neurophysiologic worksheet.    Final settings were as follows:  Left brain electrode:     1-2+           ; Amplitude  4.0   V   ; Pulse width 90 microseconds;   Frequency   160   Hz.  Right brain electrode:     4-7+          ; Amplitude   4.3  V ;  Pulse width 90  microseconds;  Frequency   160    Hz.

## 2017-04-23 ENCOUNTER — Telehealth: Payer: Self-pay | Admitting: Neurology

## 2017-04-23 DIAGNOSIS — M545 Low back pain: Secondary | ICD-10-CM | POA: Diagnosis not present

## 2017-04-23 DIAGNOSIS — M79605 Pain in left leg: Secondary | ICD-10-CM | POA: Diagnosis not present

## 2017-04-23 DIAGNOSIS — M79604 Pain in right leg: Secondary | ICD-10-CM | POA: Diagnosis not present

## 2017-04-23 NOTE — Telephone Encounter (Signed)
Spoke with patient to see how he is doing off Trihexyphenidyl. He states he has not noticed much change. He is having some swelling in his feet, but aware this wouldn't be the cause of that. Tremor is the same. He will stay off medication and let us know if any changes.   Dr. Carles ColletJuluis Rainier.

## 2017-04-23 NOTE — Telephone Encounter (Signed)
-----   Message from Annamaria Helling, [place] sent at 04/20/2017  1:21 PM EDT ----- Call to see how tremor is off trihexyphenidyl.

## 2017-04-24 ENCOUNTER — Other Ambulatory Visit: Payer: Self-pay | Admitting: Family Medicine

## 2017-04-24 DIAGNOSIS — I509 Heart failure, unspecified: Secondary | ICD-10-CM

## 2017-04-25 ENCOUNTER — Ambulatory Visit (INDEPENDENT_AMBULATORY_CARE_PROVIDER_SITE_OTHER): Payer: Medicare Other | Admitting: *Deleted

## 2017-04-25 DIAGNOSIS — I255 Ischemic cardiomyopathy: Secondary | ICD-10-CM | POA: Diagnosis not present

## 2017-04-25 NOTE — Progress Notes (Signed)
Remote ICD transmission.   

## 2017-04-26 ENCOUNTER — Encounter: Payer: Self-pay | Admitting: Family Medicine

## 2017-04-26 ENCOUNTER — Encounter: Payer: Self-pay | Admitting: Cardiology

## 2017-04-26 DIAGNOSIS — M79605 Pain in left leg: Secondary | ICD-10-CM | POA: Diagnosis not present

## 2017-04-26 DIAGNOSIS — M545 Low back pain: Secondary | ICD-10-CM | POA: Diagnosis not present

## 2017-04-26 DIAGNOSIS — M79604 Pain in right leg: Secondary | ICD-10-CM | POA: Diagnosis not present

## 2017-04-26 LAB — CUP PACEART REMOTE DEVICE CHECK
Brady Statistic AS VP Percent: 0.04 %
Brady Statistic RA Percent Paced: 0.56 %
Date Time Interrogation Session: 20190417073623
HIGH POWER IMPEDANCE MEASURED VALUE: 70 Ohm
Implantable Lead Implant Date: 20151105
Implantable Lead Location: 753860
Implantable Lead Model: 5076
Lead Channel Impedance Value: 437 Ohm
Lead Channel Impedance Value: 494 Ohm
Lead Channel Sensing Intrinsic Amplitude: 17 mV
Lead Channel Sensing Intrinsic Amplitude: 2.25 mV
Lead Channel Setting Pacing Amplitude: 2 V
Lead Channel Setting Pacing Pulse Width: 0.4 ms
Lead Channel Setting Sensing Sensitivity: 0.3 mV
MDC IDC LEAD IMPLANT DT: 20151105
MDC IDC LEAD LOCATION: 753859
MDC IDC MSMT BATTERY REMAINING LONGEVITY: 94 mo
MDC IDC MSMT BATTERY VOLTAGE: 2.99 V
MDC IDC MSMT LEADCHNL RA IMPEDANCE VALUE: 437 Ohm
MDC IDC MSMT LEADCHNL RA PACING THRESHOLD AMPLITUDE: 1 V
MDC IDC MSMT LEADCHNL RA PACING THRESHOLD PULSEWIDTH: 0.4 ms
MDC IDC MSMT LEADCHNL RA SENSING INTR AMPL: 2.25 mV
MDC IDC MSMT LEADCHNL RV PACING THRESHOLD AMPLITUDE: 0.5 V
MDC IDC MSMT LEADCHNL RV PACING THRESHOLD PULSEWIDTH: 0.4 ms
MDC IDC MSMT LEADCHNL RV SENSING INTR AMPL: 17 mV
MDC IDC PG IMPLANT DT: 20151105
MDC IDC SET LEADCHNL RV PACING AMPLITUDE: 2.5 V
MDC IDC STAT BRADY AP VP PERCENT: 0 %
MDC IDC STAT BRADY AP VS PERCENT: 0.55 %
MDC IDC STAT BRADY AS VS PERCENT: 99.4 %
MDC IDC STAT BRADY RV PERCENT PACED: 0.04 %

## 2017-04-26 NOTE — Telephone Encounter (Signed)
Spoke with Mrs Eyerman and advised her that we spoke with Mr Besson on 04/16/17 and advised that yes kidney specialist referral is needed and that referral information was sent over and waiting for appointment. I called [facility] office today and left message to call me back to let me know the status. Mrs Deeg said that Mr poke could not remember what was discussed. This has been resolved, no further action is required. Thank Edrick Kins, RMA

## 2017-04-27 ENCOUNTER — Ambulatory Visit (INDEPENDENT_AMBULATORY_CARE_PROVIDER_SITE_OTHER): Payer: Medicare Other | Admitting: Internal Medicine

## 2017-04-27 ENCOUNTER — Encounter: Payer: Self-pay | Admitting: Internal Medicine

## 2017-04-27 VITALS — BP 98/60 | HR 75 | Ht 68.0 in | Wt 264.0 lb

## 2017-04-27 DIAGNOSIS — I5022 Chronic systolic (congestive) heart failure: Secondary | ICD-10-CM

## 2017-04-27 DIAGNOSIS — I255 Ischemic cardiomyopathy: Secondary | ICD-10-CM

## 2017-04-27 DIAGNOSIS — I10 Essential (primary) hypertension: Secondary | ICD-10-CM

## 2017-04-27 DIAGNOSIS — I2510 Atherosclerotic heart disease of native coronary artery without angina pectoris: Secondary | ICD-10-CM | POA: Diagnosis not present

## 2017-04-27 DIAGNOSIS — Z95810 Presence of automatic (implantable) cardiac defibrillator: Secondary | ICD-10-CM | POA: Diagnosis not present

## 2017-04-27 LAB — CUP PACEART INCLINIC DEVICE CHECK
Brady Statistic AP VP Percent: 0 %
Brady Statistic AP VS Percent: 0.61 %
Brady Statistic AS VS Percent: 99.34 %
Brady Statistic RV Percent Paced: 0.05 %
HIGH POWER IMPEDANCE MEASURED VALUE: 73 Ohm
Implantable Lead Implant Date: 20151105
Implantable Lead Location: 753859
Implantable Lead Model: 5076
Implantable Pulse Generator Implant Date: 20151105
Lead Channel Impedance Value: 437 Ohm
Lead Channel Impedance Value: 456 Ohm
Lead Channel Pacing Threshold Amplitude: 0.5 V
Lead Channel Pacing Threshold Pulse Width: 0.4 ms
Lead Channel Sensing Intrinsic Amplitude: 23.125 mV
Lead Channel Setting Pacing Amplitude: 2.25 V
Lead Channel Setting Pacing Amplitude: 2.5 V
Lead Channel Setting Pacing Pulse Width: 0.4 ms
Lead Channel Setting Sensing Sensitivity: 0.3 mV
MDC IDC LEAD IMPLANT DT: 20151105
MDC IDC LEAD LOCATION: 753860
MDC IDC MSMT BATTERY REMAINING LONGEVITY: 94 mo
MDC IDC MSMT BATTERY VOLTAGE: 2.99 V
MDC IDC MSMT LEADCHNL RA PACING THRESHOLD AMPLITUDE: 1 V
MDC IDC MSMT LEADCHNL RA PACING THRESHOLD PULSEWIDTH: 0.4 ms
MDC IDC MSMT LEADCHNL RA SENSING INTR AMPL: 1.875 mV
MDC IDC MSMT LEADCHNL RA SENSING INTR AMPL: 2.5 mV
MDC IDC MSMT LEADCHNL RV IMPEDANCE VALUE: 494 Ohm
MDC IDC MSMT LEADCHNL RV SENSING INTR AMPL: 17.125 mV
MDC IDC SESS DTM: 20190419152155
MDC IDC STAT BRADY AS VP PERCENT: 0.04 %
MDC IDC STAT BRADY RA PERCENT PACED: 0.61 %

## 2017-04-27 NOTE — Progress Notes (Signed)
HPI [name] returns today for followup. He is a pleasant 67 yo man with a h/o chronic systolic heart failure, ICM, s/p ICD implantation. In the interim, he has been stable and he denies any ICD therapy.  No chest pain. He has class 2 sob. No worsening edema. He has minimal increase in dyspnea and peripheral edema. He is using metolazone when his weight goes up, usually once or twice a week, although sometimes not at all.  Allergies  Allergen Reactions  . Penicillins Rash and Other (See Comments)    WEARS ALLERGY BRACELET Because of a history of documented adverse serious drug reaction;Medi Alert bracelet  is recommended Has patient had a PCN reaction causing immediate rash, facial/tongue/throat swelling, SOB or lightheadedness with hypotension: Yes Has patient had a PCN reaction causing severe rash involving mucus membranes or skin necrosis: unknown Has patient had a PCN reaction that required hospitalization NO Has patient had a PCN reaction occurring within the last 10 years: NO If all of   . Peanut-Containing Drug Products Other (See Comments)    cough  . Watermelon [Citrullus Vulgaris]     Tickle in throat and cough     Current Outpatient Medications  Medication Sig Dispense Refill  . acetaminophen (TYLENOL) 325 MG tablet Take 1-2 tablets (325-650 mg total) by mouth every 4 (four) hours as needed for mild pain.    Marland Kitchen albuterol (PROVENTIL HFA;VENTOLIN HFA) 108 (90 Base) MCG/ACT inhaler Inhale 2 puffs into the lungs every 6 (six) hours as needed for wheezing or shortness of breath. 18 g 5  . aspirin EC 81 MG tablet Take 81 mg by mouth daily.    . betamethasone dipropionate (DIPROLENE) 0.05 % cream Apply topically 2 (two) times daily as needed. 45 g 1  . Fluticasone-Salmeterol (ADVAIR) 250-50 MCG/DOSE AEPB Inhale 1 puff into the lungs 2 (two) times daily. 60 each 5  . furosemide (LASIX) 80 MG tablet TAKE 1 & 1/2 TABLETS DAILY 135 tablet 1  . gabapentin (NEURONTIN) 100 MG  capsule Take 1-2 capsules (100-200 mg total) by mouth 3 (three) times daily as needed. 90 capsule 0  . ibuprofen (ADVIL,MOTRIN) 200 MG tablet Take 800 mg by mouth every 6 (six) hours as needed (For pain.).     . Insulin Disposable Pump (V-GO 40) KIT Use one pod daily with insulin 90 kit 2  . Insulin Glargine (LANTUS SOLOSTAR) 100 UNIT/ML Solostar Pen Inject 20 Units into the skin daily at 10 pm. 10 pen 1  . insulin lispro (HUMALOG) 100 UNIT/ML injection USE MAX 56 UNITS PER DAY   WITH V-GO PUMP 60 mL 2  . Insulin Pen Needle (NOVOTWIST) 32G X 5 MM MISC USE AS DIRECTED EVERY DAY 100 each 2  . KLOR-CON M20 20 MEQ tablet TAKE 2 TABLETS (40 MEQ TOTAL) BY MOUTH DAILY. 180 tablet 3  . levothyroxine (SYNTHROID, LEVOTHROID) 125 MCG tablet TAKE 1 TABLET EVERY DAY DAILY BEFORE BREAKFAST 90 tablet 1  . lisinopril (PRINIVIL,ZESTRIL) 2.5 MG tablet TAKE 1 TABLET BY MOUTH DAILY. 90 tablet 3  . metoprolol succinate (TOPROL-XL) 50 MG 24 hr tablet TAKE 1 TABLET BY MOUTH DAILY. TAKE WITH OR IMMEDIATELY FOLLOWING A MEAL. 90 tablet 0  . Multiple Vitamin (MULTIVITAMIN WITH MINERALS) TABS tablet Take 1 tablet by mouth daily.     Marland Kitchen MYRBETRIQ 50 MG TB24 tablet Take 50 mg by mouth daily.  11  . ONETOUCH DELICA LANCETS 39J MISC Use to check blood sugar 2 times per  day dx code E11.49 100 each 5  . ONETOUCH VERIO test strip USE AS DIRECTED TO CHECK BLOOD SUGAR TWICE A DAY 100 each 5  . Pramipexole Dihydrochloride 1.5 MG TB24 TAKE 1 TABLET DAILY 90 tablet 1  . rosuvastatin (CRESTOR) 20 MG tablet Take 1 tablet (20 mg total) daily by mouth. 90 tablet 3  . traMADol (ULTRAM) 50 MG tablet TAKE 1 TABLET TWICE A DAY AS NEEDED 60 tablet 1  . travoprost, benzalkonium, (TRAVATAN) 0.004 % ophthalmic solution Place 1 drop into the right eye at bedtime.     . trihexyphenidyl (ARTANE) 2 MG tablet TAKE 1 TABLET BY MOUTH EVERY DAY 90 tablet 1  . VICTOZA 18 MG/3ML SOPN INJECT 1.8MG ONCE DAILY 27 mL 1  . vitamin B-12 (CYANOCOBALAMIN) 1000 MCG  tablet Take 1,000 mcg by mouth daily.    . metolazone (ZAROXOLYN) 2.5 MG tablet Take 1 tablet (2.5 mg total) by mouth daily. PRN FOR > 3 LB IN A DAY OR >5 LB IN WEEK 30 tablet 1  . nitroGLYCERIN (NITROSTAT) 0.4 MG SL tablet Place 1 tablet (0.4 mg total) under the tongue every 5 (five) minutes as needed for chest pain. 25 tablet 2   No current facility-administered medications for this visit.      Past Medical History:  Diagnosis Date  . AICD (automatic cardioverter/defibrillator) present    Dr Lovena Le office visit yearly, MDT   . Arthritis    cane  . Asthma   . Benign neoplasm of colon   . CAD (coronary artery disease)   . Cardiomyopathy   . Cataract   . Complication of anesthesia    pt states that he got a rash  . Constipation   . 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 DM (diabetes mellitus) ([place])    TYPE 2   . GERD (gastroesophageal reflux disease)   . Glaucoma    right eye  . HLD (hyperlipidemia)   . HTN (hypertension)    pt denies 08/19/12  . Hyperplasia, prostate   . Hyperthyroidism    thyroid lobectomy  . MI (myocardial infarction) [facility])    Dr Stanford Breed 2000, x3vessels bypass  . Nephrolithiasis   . OSA (obstructive sleep apnea)    AHI-28,on CPAP, noncompliant with CPAP  . Parkinson disease ([place])    1999  . PONV (postoperative nausea and vomiting)   . Restless legs   . Shortness of breath    Hx: of at all times  . Sleep apnea    does not use CPAP  . Sleep apnea, organic   . UTI (lower urinary tract infection) 09/15/12   Klebsiella  . Ventral hernia     ROS:   All systems reviewed and negative except as noted in the HPI.   Past Surgical History:  Procedure Laterality Date  . 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 CARDIAC CATHETERIZATION    . CATARACT EXTRACTION W/ INTRAOCULAR LENS IMPLANT     Hx: of right eye  . CATARACT EXTRACTION W/ INTRAOCULAR LENS IMPLANT Left 2018  . COLONOSCOPY N/A 10/13/2014   Procedure:  COLONOSCOPY;  Surgeon: Jerene Bears, MD;  Location: [facility] ENDOSCOPY;  Service: Gastroenterology;  Laterality: N/A;  . COLONOSCOPY W/ BIOPSIES AND POLYPECTOMY     Hx: of  . CORONARY ARTERY BYPASS GRAFT  2000   [name], MD  . [place]  . DEEP BRAIN STIMULATOR PLACEMENT  2004  Right and left VIN stimulator placement (parkinsons)  . EYE SURGERY    . FINGER AMPUTATION     left pointer  . IMPLANTABLE CARDIOVERTER DEFIBRILLATOR IMPLANT N/A 11/13/2013   Procedure: IMPLANTABLE CARDIOVERTER DEFIBRILLATOR IMPLANT;  Surgeon: Evans Lance, MD;  [facility] C[facility] LAB;  Service: Cardiovascular;  Laterality: N/A;  . INSERT / REPLACE / REMOVE PACEMAKER    . LEFT AND RIGHT HEART CATHETERIZATION WITH CORONARY ANGIOGRAM N/A 09/24/2013   Procedure: LEFT AND RIGHT HEART CATHETERIZATION WITH CORONARY ANGIOGRAM;  Surgeon: Burnell Blanks, MD;  Location: [facility] CATH LAB;  Service: Cardiovascular;  Laterality: N/A;  . LITHOTRIPSY     3 different times  . MEDIAN STERNOTOMY  2000  . SUBTHALAMIC STIMULATOR BATTERY REPLACEMENT N/A 09/05/2012   Procedure: Deep brain stimulator battery change;  Surgeon: Erline Levine, MD;  Location: [facility]UR[facility];  Service: Neurosurgery;  Laterality: N/A;  Deep brain stimulator battery change  . SUBTHALAMIC STIMULATOR BATTERY REPLACEMENT N/A 06/10/2015   Procedure: Deep Brain stimulator battery change;  Surgeon: Erline Levine, MD;  Location: [facility] ORS;  Service: Neurosurgery;  Laterality: N/A;  . TONSILLECTOMY       Family History  Problem Relation Age of Onset  . Aneurysm Mother   . Alcoholism Father   . HIV/AIDS Brother 40       AIDS  . Healthy Child   . Peripheral vascular disease Unknown   . Arthritis Unknown   . Healthy Child   . Healthy Child   . Diabetes Neg Hx   . Heart disease Neg Hx   . Colon cancer Neg Hx   . Prostate cancer Neg Hx      Social History   Socioeconomic History  . Marital status: Married    Spouse name: [name]  .  Number of children: 2  . Years of education: Not on file  . Highest education level: Not on file  Occupational History  . Occupation: DISABLED    Comment: [employer]  Social Needs  . Financial resource strain: Not on file  . Food insecurity:    Worry: Not on file    Inability: Not on file  . Transportation needs:    Medical: Not on file    Non-medical: Not on file  Tobacco Use  . Smoking status: Never Smoker  . Smokeless tobacco: Never Used  Substance and Sexual Activity  . Alcohol use: Yes    Comment: occasional beer  . Drug use: No  . Sexual activity: Not on file  Lifestyle  . Physical activity:    Days per week: Not on file    Minutes per session: Not on file  . Stress: Not on file  Relationships  . Social connections:    Talks on phone: Not on file    Gets together: Not on file    Attends religious service: Not on file    Active member of club or organization: Not on file    Attends meetings of clubs or organizations: Not on file    Relationship status: Not on file  . Intimate partner violence:    Fear of current or ex partner: Not on file    Emotionally abused: Not on file    Physically abused: Not on file    Forced sexual activity: Not on file  Other Topics Concern  . Not on file  Social History Narrative   From [facility]   Retired/disability Clinical research associate   Likes to fish.     Married  1972   3 kids   [place] fan     BP 98/60   Pulse 75   Ht _0  (1.727 m)   Wt 264 lb (119.7 kg)   SpO2 95%   BMI 40.14 kg/m   Physical Exam:  Well appearing NAD HEENT: Unremarkable Neck:  No JVD, no thyromegally Lymphatics:  No adenopathy Back:  No CVA tenderness Lungs:  Clear HEART:  Regular rate rhythm, no murmurs, no rubs, no clicks Abd:  soft, positive bowel sounds, no organomegally, no rebound, no guarding Ext:  2 plus pulses, trace edema, no cyanosis, no clubbing Skin:  No rashes no nodules Neuro:  CN II through XII intact,  motor grossly intact  EKG - NSR with rightward axis  DEVICE  Normal device function.  See PaceArt for details.   Assess/Plan: 1. Chronic systolic heart failure - his fluid index looks good today. He has a little peripheral edema. I have asked him to reduce his salt intake.  2. Obesity - he is encouraged to lose weight.  3. ICD - his medtronic DDD ICD is working normally. Almost 8years of battery longevity. 4. CAD - he does not have any angina. He is trying to increase his activity. He has not require NTG.  Mikle Bosworth.D.

## 2017-04-27 NOTE — Patient Instructions (Signed)

## 2017-04-29 ENCOUNTER — Telehealth: Payer: Self-pay | Admitting: Family Medicine

## 2017-04-29 NOTE — Telephone Encounter (Signed)
Call pt.  I have been checking back on this with the possible repeat imaging re: hx of acoustic neuroma.   I looked back at the neurology notes about his asymmetry in ear canals.  I can't imagine that a neuroma would be causing that without causing other substantial symptoms.  Given all of this, I don't think it makes sense to put him through the MRI especially given the hardware has already has.   Let me know if he has questions about that.  Thanks.

## 2017-04-29 NOTE — Telephone Encounter (Signed)
See mychart message. Please let her know I had put in the referral for renal and I sent the referral coordinators a note to check on this.  Thanks.

## 2017-04-30 ENCOUNTER — Encounter: Payer: Self-pay | Admitting: *Deleted

## 2017-04-30 ENCOUNTER — Telehealth: Payer: Self-pay | Admitting: Neurology

## 2017-04-30 DIAGNOSIS — M79605 Pain in left leg: Secondary | ICD-10-CM | POA: Diagnosis not present

## 2017-04-30 DIAGNOSIS — M545 Low back pain: Secondary | ICD-10-CM | POA: Diagnosis not present

## 2017-04-30 DIAGNOSIS — M79604 Pain in right leg: Secondary | ICD-10-CM | POA: Diagnosis not present

## 2017-04-30 NOTE — Telephone Encounter (Signed)
Left message on machine for patient to call back.

## 2017-04-30 NOTE — Telephone Encounter (Signed)
Patient Lmom regarding coming off medication? Please Call. Thanks

## 2017-04-30 NOTE — Telephone Encounter (Signed)
Patient's wife called back and states patient got worse after stopping Trihexyphenidyl. By last Thursday he couldn't even walk (he was okay over the weekend when I checked on him). They put him back on meds on Friday and he is doing much better. He is taking 2 mg once daily. Please advise.

## 2017-04-30 NOTE — Telephone Encounter (Signed)
Patient advised and agrees.  1) Patient says he is having problems with his toes cramping, especially at night.  Do you have any suggestions?  2) Patient states he has seen a kidney specialist but they didn't really come up with any cause for his symptoms.  He says he spoke with you briefly about his decreased stream, etc.  Please advise.

## 2017-04-30 NOTE — Telephone Encounter (Signed)
Patient's wife made aware.

## 2017-04-30 NOTE — Telephone Encounter (Signed)
My Chart message sent

## 2017-04-30 NOTE — Telephone Encounter (Signed)
Henry [name] doesn't help walking, only helps his tremor.  If anything it would make walking worse.  Maybe wait 2 weeks and try again?

## 2017-05-02 NOTE — Telephone Encounter (Signed)
Renal clinic is going to schedule him when possible.  I would stop ibuprofen- renal agreed.  I thought this was already stopped.    He can try stretching his feet with some modest inc in fluid intake prior to bed.  See if that helps his feet.    If he isn't having trouble with burning with urination then I wouldn't add on any meds for urination at this point.    Thanks.

## 2017-05-02 NOTE — Telephone Encounter (Signed)
Patient notified as instructed by telephone and verbalized understanding. Patient stated that he has not been taking Ibuprofen.

## 2017-05-03 ENCOUNTER — Encounter: Payer: Self-pay | Admitting: Internal Medicine

## 2017-05-03 ENCOUNTER — Ambulatory Visit (INDEPENDENT_AMBULATORY_CARE_PROVIDER_SITE_OTHER): Payer: Medicare Other | Admitting: Internal Medicine

## 2017-05-03 VITALS — BP 104/58 | HR 84 | Temp 98.4°F | Ht 68.0 in | Wt 257.0 lb

## 2017-05-03 DIAGNOSIS — I255 Ischemic cardiomyopathy: Secondary | ICD-10-CM

## 2017-05-03 DIAGNOSIS — M109 Gout, unspecified: Secondary | ICD-10-CM

## 2017-05-03 MED ORDER — HYDROCODONE-ACETAMINOPHEN 5-325 MG PO TABS: 1.0000 | ORAL_TABLET | Freq: Four times a day (QID) | ORAL | 0 refills | Status: DC | PRN

## 2017-05-03 MED ORDER — COLCHICINE 0.6 MG PO TABS: ORAL_TABLET | ORAL | 0 refills | Status: DC

## 2017-05-03 NOTE — Progress Notes (Signed)
Subjective:    Patient ID: Henry [name], male    DOB: [birth year], 67 y.o.   MRN: [id number]  HPI  Pt presents to the clinic today with c/o left foot pain. He reports this started 3 days ago. He reports the area is red, painful and swollen. He describes the pain as throbbing and burning. The pain is worse with ambulation. He denies any injury to the area. He does not think he got bit by an insect. He has no history of gout. He has tried elevation, ice, and Tramadol with minimal relief.   Review of Systems      Past Medical History:  Diagnosis Date  . AICD (automatic cardioverter/defibrillator) present    Dr Lovena Le office visit yearly, MDT   . Arthritis    cane  . Asthma   . Benign neoplasm of colon   . CAD (coronary artery disease)   . Cardiomyopathy   . Cataract   . Complication of anesthesia    pt states that he got a rash  . Constipation   . 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 GERD (gastroesophageal reflux disease)   . Glaucoma    right eye  . HLD (hyperlipidemia)   . HTN (hypertension)    pt denies 08/19/12  . Hyperplasia, prostate   . Hyperthyroidism    thyroid lobectomy  . MI (myocardial infarction) [facility])    Dr Stanford Breed 2000, x3vessels bypass  . Nephrolithiasis   . OSA (obstructive sleep apnea)    AHI-28,on CPAP, noncompliant with CPAP  . Parkinson disease ([place])    1999  . PONV (postoperative nausea and vomiting)   . Restless legs   . Shortness of breath    Hx: of at all times  . Sleep apnea    does not use CPAP  . Sleep apnea, organic   . UTI (lower urinary tract infection) 09/15/12   Klebsiella  . Ventral hernia     Current Outpatient Medications  Medication Sig Dispense Refill  . acetaminophen (TYLENOL) 325 MG tablet Take 1-2 tablets (325-650 mg total) by mouth every 4 (four) hours as needed for mild pain.    Marland Kitchen albuterol (PROVENTIL HFA;VENTOLIN HFA) 108 (90 Base) MCG/ACT inhaler  Inhale 2 puffs into the lungs every 6 (six) hours as needed for wheezing or shortness of breath. 18 g 5  . aspirin EC 81 MG tablet Take 81 mg by mouth daily.    . betamethasone dipropionate (DIPROLENE) 0.05 % cream Apply topically 2 (two) times daily as needed. 45 g 1  . Fluticasone-Salmeterol (ADVAIR) 250-50 MCG/DOSE AEPB Inhale 1 puff into the lungs 2 (two) times daily. 60 each 5  . furosemide (LASIX) 80 MG tablet TAKE 1 & 1/2 TABLETS DAILY 135 tablet 1  . gabapentin (NEURONTIN) 100 MG capsule Take 1-2 capsules (100-200 mg total) by mouth 3 (three) times daily as needed. 90 capsule 0  . Insulin Disposable Pump (V-GO 40) KIT Use one pod daily with insulin 90 kit 2  . Insulin Glargine (LANTUS SOLOSTAR) 100 UNIT/ML Solostar Pen Inject 20 Units into the skin daily at 10 pm. 10 pen 1  . insulin lispro (HUMALOG) 100 UNIT/ML injection USE MAX 56 UNITS PER DAY   WITH V-GO PUMP 60 mL 2  . Insulin Pen Needle (NOVOTWIST) 32G X 5 MM MISC USE AS DIRECTED EVERY DAY 100 each 2  . KLOR-CON M20  20 MEQ tablet TAKE 2 TABLETS (40 MEQ TOTAL) BY MOUTH DAILY. 180 tablet 3  . levothyroxine (SYNTHROID, LEVOTHROID) 125 MCG tablet TAKE 1 TABLET EVERY DAY DAILY BEFORE BREAKFAST 90 tablet 1  . lisinopril (PRINIVIL,ZESTRIL) 2.5 MG tablet TAKE 1 TABLET BY MOUTH DAILY. 90 tablet 3  . metoprolol succinate (TOPROL-XL) 50 MG 24 hr tablet TAKE 1 TABLET BY MOUTH DAILY. TAKE WITH OR IMMEDIATELY FOLLOWING A MEAL. 90 tablet 0  . Multiple Vitamin (MULTIVITAMIN WITH MINERALS) TABS tablet Take 1 tablet by mouth daily.     Marland Kitchen MYRBETRIQ 50 MG TB24 tablet Take 50 mg by mouth daily.  11  . ONETOUCH DELICA LANCETS 20U MISC Use to check blood sugar 2 times per day dx code E11.49 100 each 5  . ONETOUCH VERIO test strip USE AS DIRECTED TO CHECK BLOOD SUGAR TWICE A DAY 100 each 5  . Pramipexole Dihydrochloride 1.5 MG TB24 TAKE 1 TABLET DAILY 90 tablet 1  . rosuvastatin (CRESTOR) 20 MG tablet Take 1 tablet (20 mg total) daily by mouth. 90 tablet 3    . traMADol (ULTRAM) 50 MG tablet TAKE 1 TABLET TWICE A DAY AS NEEDED 60 tablet 1  . travoprost, benzalkonium, (TRAVATAN) 0.004 % ophthalmic solution Place 1 drop into the right eye at bedtime.     . trihexyphenidyl (ARTANE) 2 MG tablet TAKE 1 TABLET BY MOUTH EVERY DAY 90 tablet 1  . VICTOZA 18 MG/3ML SOPN INJECT 1.'8MG'$  ONCE DAILY 27 mL 1  . vitamin B-12 (CYANOCOBALAMIN) 1000 MCG tablet Take 1,000 mcg by mouth daily.    . colchicine 0.6 MG tablet Take 2 tabs now, 1 tab this evening. Then take 1 tab PO BID until flare resolved 30 tablet 0  . HYDROcodone-acetaminophen (NORCO/VICODIN) 5-325 MG tablet Take 1 tablet by mouth every 6 (six) hours as needed for moderate pain. 20 tablet 0  . metolazone (ZAROXOLYN) 2.5 MG tablet Take 1 tablet (2.5 mg total) by mouth daily. PRN FOR > 3 LB IN A DAY OR >5 LB IN WEEK 30 tablet 1  . nitroGLYCERIN (NITROSTAT) 0.4 MG SL tablet Place 1 tablet (0.4 mg total) under the tongue every 5 (five) minutes as needed for chest pain. 25 tablet 2   No current facility-administered medications for this visit.     Allergies  Allergen Reactions  . Penicillins Rash and Other (See Comments)    WEARS ALLERGY BRACELET Because of a history of documented adverse serious drug reaction;Medi Alert bracelet  is recommended Has patient had a PCN reaction causing immediate rash, facial/tongue/throat swelling, SOB or lightheadedness with hypotension: Yes Has patient had a PCN reaction causing severe rash involving mucus membranes or skin necrosis: unknown Has patient had a PCN reaction that required hospitalization NO Has patient had a PCN reaction occurring within the last 10 years: NO If all of   . Peanut-Containing Drug Products Other (See Comments)    cough  . Watermelon [Citrullus Vulgaris]     Tickle in throat and cough    Family History  Problem Relation Age of Onset  . Aneurysm Mother   . Alcoholism Father   . HIV/AIDS Brother 36       AIDS  . Healthy Child   .  Peripheral vascular disease Unknown   . Arthritis Unknown   . Healthy Child   . Healthy Child   . Diabetes Neg Hx   . Heart disease Neg Hx   . Colon cancer Neg Hx   . Prostate cancer Neg Hx  Social History   Socioeconomic History  . Marital status: Married    Spouse name: [name]  . Number of children: 2  . Years of education: Not on file  . Highest education level: Not on file  Occupational History  . Occupation: DISABLED    Comment: [employer]  Social Needs  . Financial resource strain: Not on file  . Food insecurity:    Worry: Not on file    Inability: Not on file  . Transportation needs:    Medical: Not on file    Non-medical: Not on file  Tobacco Use  . Smoking status: Never Smoker  . Smokeless tobacco: Never Used  Substance and Sexual Activity  . Alcohol use: Yes    Comment: occasional beer  . Drug use: No  . Sexual activity: Not on file  Lifestyle  . Physical activity:    Days per week: Not on file    Minutes per session: Not on file  . Stress: Not on file  Relationships  . Social connections:    Talks on phone: Not on file    Gets together: Not on file    Attends religious service: Not on file    Active member of club or organization: Not on file    Attends meetings of clubs or organizations: Not on file    Relationship status: Not on file  . Intimate partner violence:    Fear of current or ex partner: Not on file    Emotionally abused: Not on file    Physically abused: Not on file    Forced sexual activity: Not on file  Other Topics Concern  . Not on file  Social History Narrative   [facility]   Retired/disability Clinical research associate   Likes to fish.     Married 1972   3 kids   [place] fan     Constitutional: Denies fever, malaise, fatigue, headache or abrupt weight changes.  Musculoskeletal: Pt reports left foot pain. Denies muscle pain.  Skin: Pt reports redness and swelling of left foot. Denies rashes, lesions  or ulcercations.    No other specific complaints in a complete review of systems (except as listed in HPI above).  Objective:   Physical Exam  BP (!) 104/58 (BP Location: Right Arm, Patient Position: Sitting, Cuff Size: Large)   Pulse 84   Temp 98.4 F (36.9 C) (Oral)   Ht '5\' 8"'$  (1.727 m)   Wt 257 lb (116.6 kg)   SpO2 97%   BMI 39.08 kg/m  Wt Readings from Last 3 Encounters:  05/03/17 257 lb (116.6 kg)  04/27/17 264 lb (119.7 kg)  04/20/17 261 lb (118.4 kg)    General: Appears his stated age, chronically ill appearing, in NAD. Skin: Redness, swelling and warmth noted of left MTP. Musculoskeletal: Pain with palpation of the left MTP. Difficulty with gait, in WC.  BMET    Component Value Date/Time   NA 137 04/04/2017 0902   NA 142 12/22/2016 1233   K 3.9 04/04/2017 0902   CL 92 (L) 04/04/2017 0902   CO2 35 (H) 04/04/2017 0902   GLUCOSE 257 (H) 04/04/2017 0902   BUN 35 (H) 04/04/2017 0902   BUN 33 (H) 12/22/2016 1233   CREATININE 2.02 (H) 04/04/2017 0902   CREATININE 1.53 (H) 05/22/2016 1106   CALCIUM 9.3 04/04/2017 0902   GFRNONAA 39 (L) 02/18/2017 1112   GFRAA 45 (L) 02/18/2017 1112    Lipid Panel     Component  Value Date/Time   CHOL 123 04/04/2017 0902   TRIG 232.0 (H) 04/04/2017 0902   HDL 32.30 (L) 04/04/2017 0902   CHOLHDL 4 04/04/2017 0902   VLDL 46.4 (H) 04/04/2017 0902   LDLCALC 118 (H) 09/16/2015 0857    CBC    Component Value Date/Time   WBC 7.8 04/04/2017 0902   RBC 4.40 04/04/2017 0902   HGB 12.5 (L) 04/04/2017 0902   HCT 36.3 (L) 04/04/2017 0902   PLT 151.0 04/04/2017 0902   MCV 82.5 04/04/2017 0902   MCH 27.9 02/18/2017 1112   MCHC 34.4 04/04/2017 0902   RDW 14.8 04/04/2017 0902   LYMPHSABS 2.1 04/04/2017 0902   MONOABS 0.6 04/04/2017 0902   EOSABS 0.3 04/04/2017 0902   BASOSABS 0.1 04/04/2017 0902    Hgb A1C Lab Results  Component Value Date   HGBA1C 8.3 (H) 02/05/2017            Assessment & Plan:   Gout of Left  MTP:  Encouraged elevation eRx for Colchicine- take as directed eRx for Norco 5-325 mg QID prn (advised him to take instead of Tramadol) Check uric acid level in 1 month  Return precautions discussed Webb Silversmith, NP

## 2017-05-03 NOTE — Patient Instructions (Signed)

## 2017-05-07 ENCOUNTER — Other Ambulatory Visit: Payer: Self-pay | Admitting: Endocrinology

## 2017-05-17 ENCOUNTER — Encounter: Payer: Self-pay | Admitting: Endocrinology

## 2017-05-17 ENCOUNTER — Ambulatory Visit (INDEPENDENT_AMBULATORY_CARE_PROVIDER_SITE_OTHER): Payer: Medicare Other | Admitting: Endocrinology

## 2017-05-17 ENCOUNTER — Encounter: Payer: Self-pay | Admitting: Family Medicine

## 2017-05-17 VITALS — BP 102/62 | HR 80 | Ht 68.0 in | Wt 260.8 lb

## 2017-05-17 DIAGNOSIS — E890 Postprocedural hypothyroidism: Secondary | ICD-10-CM | POA: Diagnosis not present

## 2017-05-17 DIAGNOSIS — N183 Chronic kidney disease, stage 3 unspecified: Secondary | ICD-10-CM

## 2017-05-17 DIAGNOSIS — Z794 Long term (current) use of insulin: Secondary | ICD-10-CM

## 2017-05-17 DIAGNOSIS — E1165 Type 2 diabetes mellitus with hyperglycemia: Secondary | ICD-10-CM

## 2017-05-17 DIAGNOSIS — E790 Hyperuricemia without signs of inflammatory arthritis and tophaceous disease: Secondary | ICD-10-CM | POA: Diagnosis not present

## 2017-05-17 DIAGNOSIS — I255 Ischemic cardiomyopathy: Secondary | ICD-10-CM

## 2017-05-17 LAB — COMPREHENSIVE METABOLIC PANEL
ALBUMIN: 3.9 g/dL (ref 3.5–5.2)
ALK PHOS: 80 U/L (ref 39–117)
ALT: 25 U/L (ref 0–53)
AST: 14 U/L (ref 0–37)
BUN: 54 mg/dL — ABNORMAL HIGH (ref 6–23)
CALCIUM: 8.9 mg/dL (ref 8.4–10.5)
CO2: 32 mEq/L (ref 19–32)
Chloride: 93 mEq/L — ABNORMAL LOW (ref 96–112)
Creatinine, Ser: 2.26 mg/dL — ABNORMAL HIGH (ref 0.40–1.50)
GFR: 31.03 mL/min — AB (ref >60.00)
Glucose, Bld: 292 mg/dL — ABNORMAL HIGH (ref 70–99)
Potassium: 3.5 mEq/L (ref 3.5–5.1)
Sodium: 134 mEq/L — ABNORMAL LOW (ref 135–145)
TOTAL PROTEIN: 6.6 g/dL (ref 6.0–8.3)
Total Bilirubin: 0.3 mg/dL (ref 0.2–1.2)

## 2017-05-17 LAB — POCT GLYCOSYLATED HEMOGLOBIN (HGB A1C): Hemoglobin A1C: 9.6

## 2017-05-17 LAB — URIC ACID: URIC ACID, SERUM: 12.2 mg/dL — AB (ref 4.0–7.8)

## 2017-05-17 LAB — TSH: TSH: 4.57 u[IU]/mL — ABNORMAL HIGH (ref 0.35–4.50)

## 2017-05-17 NOTE — Progress Notes (Addendum)
Patient ID: Henry [name], male   DOB: 1950/05/14, 67 y.o.   MRN: [id number]           Reason for Appointment: Follow-up for Type 2 Diabetes   History of Present Illness:          Diagnosis: Type 2 diabetes mellitus, date of diagnosis: 2000      Past history:   Patient thinks he has been taking Amaryl for several years and probably metformin since onset also At some point he was changed from metformin to [facility] and was taking this since at least 2012 A1c had been higher in 2015 Since his A1c had been progressively higher with his regimen of Janumet and Amaryl he was started on Victoza in 01/2014 He was started on Lantus insulin in 5/16 because of persistent hyperglycemia especially fasting; was having readings as high as 293 Because of tendency to high postprandial readings and for control with Lantus he was switched to the V-go pump in  09/2014  Recent history:   INSULIN regimen: V-go-40 pump and boluses 4-8 units.  Lantus 20 units at bedtime  Non-insulin hypoglycemic drugs the patient is taking are: Metformin 1 g a day, Victoza 1.8 mg daily in am   His A1c was last 8.3 and is now 9.6  Current management, blood sugar patterns and problems identified:  He has increased his Lantus up to 20 units but his fasting readings are still consistently high  Not clear why he is getting more insulin resistant but may be partly related to stopping metformin previously with renal dysfunction  Also blood sugars appear to have flared up when he had a gout episode about 3 weeks ago  He did not apparently get any steroids for this  He now says that he is no change in his V-go pump until he runs out of insulin completely and this could be 3 to 4 hours extra after the 24-hour period  He tends to eat snacks frequently and still during the night also  As before has difficulty losing weight  He does not understand the need for increasing the boluses when his blood sugars are higher and still  taking 4 clicks at suppertime  He is compliant with the dose of 1.8 mg on the Victoza pen  He has seen the dietitian in 3/16  Not able to exercise because of back pain and musculoskeletal issues and balance       Side effects from medications have been: None Compliance with the medical regimen: Fair  Hypoglycemia:   none  Glucose monitoring:  done 1-2 times  a day         Glucometer: One Touch ultra 2 .      Blood Glucose readings by   Mean values apply above for all meters except median for One Touch  PRE-MEAL Fasting Lunch Dinner  overnight Overall  Glucose range:  212-103   192-388    Mean/median:  269    299 269   POST-MEAL PC Breakfast PC Lunch PC Dinner  Glucose range:    131-183  Mean/median:        Mean values apply above for all meters except median for One Touch  PRE-MEAL Fasting Lunch Dinner  overnight Overall  Glucose range:  101-353    149-188   Mean/median: 236    232   POST-MEAL PC Breakfast PC Lunch  7-10 PM  Glucose range:    125-303  Mean/median:   248  226  Self-care: The diet that the patient has been following is: None, some fried food and cookies, mostly sugar-free         Meals: 9 am , 2-3 pm and 6-7 pm     Exercise: unable to do any          Dietician visit, most recent: 4/19               Weight history:   Wt Readings from Last 3 Encounters:  05/17/17 260 lb 12.8 oz (118.3 kg)  05/03/17 257 lb (116.6 kg)  04/27/17 264 lb (119.7 kg)    Glycemic control:   Lab Results  Component Value Date   HGBA1C 8.3 (H) 02/05/2017   HGBA1C 7.9 (H) 10/16/2016   HGBA1C 7.9 07/04/2016   Lab Results  Component Value Date   MICROALBUR <0.7 02/05/2017   LDLCALC 118 (H) 09/16/2015   CREATININE 2.02 (H) 04/04/2017    Lab Results  Component Value Date   FRUCTOSAMINE 350 (H) 03/29/2017   FRUCTOSAMINE 250 09/16/2015   FRUCTOSAMINE 259 12/15/2014     Other active problems: See review of systems      Allergies as of 05/17/2017       Reactions   Penicillins Rash, Other (See Comments)   WEARS ALLERGY BRACELET Because of a history of documented adverse serious drug reaction;Medi Alert bracelet  is recommended Has patient had a PCN reaction causing immediate rash, facial/tongue/throat swelling, SOB or lightheadedness with hypotension: Yes Has patient had a PCN reaction causing severe rash involving mucus membranes or skin necrosis: unknown Has patient had a PCN reaction that required hospitalization NO Has patient had a PCN reaction occurring within the last 10 years: NO If all of    Peanut-containing Drug Products Other (See Comments)   cough   Watermelon [citrullus Vulgaris]    Tickle in throat and cough      Medication List        Accurate as of 05/17/17  2:30 PM. Always use your most recent med list.          acetaminophen 325 MG tablet Commonly known as:  TYLENOL Take 1-2 tablets (325-650 mg total) by mouth every 4 (four) hours as needed for mild pain.   albuterol 108 (90 Base) MCG/ACT inhaler Commonly known as:  PROVENTIL HFA;VENTOLIN HFA Inhale 2 puffs into the lungs every 6 (six) hours as needed for wheezing or shortness of breath.   aspirin EC 81 MG tablet Take 81 mg by mouth daily.   betamethasone dipropionate 0.05 % cream Commonly known as:  DIPROLENE Apply topically 2 (two) times daily as needed.   colchicine 0.6 MG tablet Take 2 tabs now, 1 tab this evening. Then take 1 tab PO BID until flare resolved   Fluticasone-Salmeterol 250-50 MCG/DOSE Aepb Commonly known as:  ADVAIR Inhale 1 puff into the lungs 2 (two) times daily.   furosemide 80 MG tablet Commonly known as:  LASIX TAKE 1 & 1/2 TABLETS DAILY   gabapentin 100 MG capsule Commonly known as:  NEURONTIN Take 1-2 capsules (100-200 mg total) by mouth 3 (three) times daily as needed.   HYDROcodone-acetaminophen 5-325 MG tablet Commonly known as:  NORCO/VICODIN Take 1 tablet by mouth every 6 (six) hours as needed for moderate pain.     Insulin Glargine 100 UNIT/ML Solostar Pen Commonly known as:  LANTUS SOLOSTAR Inject 20 Units into the skin daily at 10 pm.   insulin lispro 100 UNIT/ML injection Commonly known as:  HUMALOG USE MAX  56 UNITS PER DAY   WITH V-GO PUMP   Insulin Pen Needle 32G X 5 MM Misc Commonly known as:  NOVOTWIST USE AS DIRECTED EVERY DAY   KLOR-CON M20 20 MEQ tablet Generic drug:  potassium chloride SA TAKE 2 TABLETS (40 MEQ TOTAL) BY MOUTH DAILY.   levothyroxine 125 MCG tablet Commonly known as:  SYNTHROID, LEVOTHROID TAKE 1 TABLET EVERY DAY DAILY BEFORE BREAKFAST   lisinopril 2.5 MG tablet Commonly known as:  PRINIVIL,ZESTRIL TAKE 1 TABLET BY MOUTH DAILY.   metolazone 2.5 MG tablet Commonly known as:  ZAROXOLYN Take 1 tablet (2.5 mg total) by mouth daily. PRN FOR > 3 LB IN A DAY OR >5 LB IN WEEK   metoprolol succinate 50 MG 24 hr tablet Commonly known as:  TOPROL-XL TAKE 1 TABLET BY MOUTH DAILY. TAKE WITH OR IMMEDIATELY FOLLOWING A MEAL.   multivitamin with minerals Tabs tablet Take 1 tablet by mouth daily.   MYRBETRIQ 50 MG Tb24 tablet Generic drug:  mirabegron ER Take 50 mg by mouth daily.   nitroGLYCERIN 0.4 MG SL tablet Commonly known as:  NITROSTAT Place 1 tablet (0.4 mg total) under the tongue every 5 (five) minutes as needed for chest pain.   ONETOUCH DELICA LANCETS 45O Misc Use to check blood sugar 2 times per day dx code E11.49   ONETOUCH VERIO test strip Generic drug:  glucose blood USE AS DIRECTED TO CHECK BLOOD SUGAR TWICE A DAY   Pramipexole Dihydrochloride 1.5 MG Tb24 TAKE 1 TABLET DAILY   rosuvastatin 20 MG tablet Commonly known as:  CRESTOR Take 1 tablet (20 mg total) daily by mouth.   traMADol 50 MG tablet Commonly known as:  ULTRAM TAKE 1 TABLET TWICE A DAY AS NEEDED   travoprost (benzalkonium) 0.004 % ophthalmic solution Commonly known as:  TRAVATAN Place 1 drop into the right eye at bedtime.   trihexyphenidyl 2 MG tablet Commonly known as:   ARTANE TAKE 1 TABLET BY MOUTH EVERY DAY   V-GO 40 Kit Use one pod daily with insulin   VICTOZA 18 MG/3ML Sopn Generic drug:  liraglutide INJECT 1.8MG ONCE DAILY   vitamin B-12 1000 MCG tablet Commonly known as:  CYANOCOBALAMIN Take 1,000 mcg by mouth daily.       Allergies:  Allergies  Allergen Reactions  . Penicillins Rash and Other (See Comments)    WEARS ALLERGY BRACELET Because of a history of documented adverse serious drug reaction;Medi Alert bracelet  is recommended Has patient had a PCN reaction causing immediate rash, facial/tongue/throat swelling, SOB or lightheadedness with hypotension: Yes Has patient had a PCN reaction causing severe rash involving mucus membranes or skin necrosis: unknown Has patient had a PCN reaction that required hospitalization NO Has patient had a PCN reaction occurring within the last 10 years: NO If all of   . Peanut-Containing Drug Products Other (See Comments)    cough  . Watermelon [Citrullus Vulgaris]     Tickle in throat and cough    Past Medical History:  Diagnosis Date  . AICD (automatic cardioverter/defibrillator) present    Dr Lovena Le office visit yearly, MDT   . Arthritis    cane  . Asthma   . Benign neoplasm of colon   . CAD (coronary artery disease)   . Cardiomyopathy   . Cataract   . Complication of anesthesia    pt states that he got a rash  . Constipation   . 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  DM (diabetes mellitus) ([place])    TYPE 2   . GERD (gastroesophageal reflux disease)   . Glaucoma    right eye  . HLD (hyperlipidemia)   . HTN (hypertension)    pt denies 08/19/12  . Hyperplasia, prostate   . Hyperthyroidism    thyroid lobectomy  . MI (myocardial infarction) [facility])    Dr Stanford Breed 2000, x3vessels bypass  . Nephrolithiasis   . OSA (obstructive sleep apnea)    AHI-28,on CPAP, noncompliant with CPAP  . Parkinson disease ([place])    1999  . PONV (postoperative nausea and vomiting)    . Restless legs   . Shortness of breath    Hx: of at all times  . Sleep apnea    does not use CPAP  . Sleep apnea, organic   . UTI (lower urinary tract infection) 09/15/12   Klebsiella  . Ventral hernia     Past Surgical History:  Procedure Laterality Date  . 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 CARDIAC CATHETERIZATION    . CATARACT EXTRACTION W/ INTRAOCULAR LENS IMPLANT     Hx: of right eye  . CATARACT EXTRACTION W/ INTRAOCULAR LENS IMPLANT Left 2018  . COLONOSCOPY N/A 10/13/2014   Procedure: COLONOSCOPY;  Surgeon: Jerene Bears, MD;  Location: [facility] ENDOSCOPY;  Service: Gastroenterology;  Laterality: N/A;  . COLONOSCOPY W/ BIOPSIES AND POLYPECTOMY     Hx: of  . CORONARY ARTERY BYPASS GRAFT  2000   [name], MD  . [place]  . DEEP BRAIN STIMULATOR PLACEMENT  2004   Right and left VIN stimulator placement (parkinsons)  . EYE SURGERY    . FINGER AMPUTATION     left pointer  . IMPLANTABLE CARDIOVERTER DEFIBRILLATOR IMPLANT N/A 11/13/2013   Procedure: IMPLANTABLE CARDIOVERTER DEFIBRILLATOR IMPLANT;  Surgeon: Evans Lance, MD;  Location[facility] C[facility] LAB;  Service: Cardiovascular;  Laterality: N/A;  . INSERT / REPLACE / REMOVE PACEMAKER    . LEFT AND RIGHT HEART CATHETERIZATION WITH CORONARY ANGIOGRAM N/A 09/24/2013   Procedure: LEFT AND RIGHT HEART CATHETERIZATION WITH CORONARY ANGIOGRAM;  Surgeon: Burnell Blanks, MD;  Location: [facility] CATH LAB;  Service: Cardiovascular;  Laterality: N/A;  . LITHOTRIPSY     3 different times  . MEDIAN STERNOTOMY  2000  . SUBTHALAMIC STIMULATOR BATTERY REPLACEMENT N/A 09/05/2012   Procedure: Deep brain stimulator battery change;  Surgeon: Erline Levine, MD;  Location: [place] NEURO ORS;  Service: Neurosurgery;  Laterality: N/A;  Deep brain stimulator battery change  . SUBTHALAMIC STIMULATOR BATTERY REPLACEMENT N/A 06/10/2015   Procedure: Deep Brain stimulator battery change;  Surgeon: Erline Levine, MD;  Location:  [place] NEURO ORS;  Service: Neurosurgery;  Laterality: N/A;  . TONSILLECTOMY      Family History  Problem Relation Age of Onset  . Aneurysm Mother   . Alcoholism Father   . HIV/AIDS Brother 57       AIDS  . Healthy Child   . Peripheral vascular disease Unknown   . Arthritis Unknown   . Healthy Child   . Healthy Child   . Diabetes Neg Hx   . Heart disease Neg Hx   . Colon cancer Neg Hx   . Prostate cancer Neg Hx     Social History:  reports that he has never smoked. He has never used smokeless tobacco. He reports that he drinks alcohol. He reports that he does not use drugs.    Review of Systems  Gout of Left MTP joint on 05/03/17: better but no uric acid level recently   Most recent eye exam was done in 04/2017    HYPERLIPIDEMIA:  He is on Crestor with good control of LDL, has low HDL Triglycerides are persistently high, he is on Lovaza         Lab Results  Component Value Date   CHOL 123 04/04/2017   CHOL 111 10/16/2016   CHOL 132 12/06/2015   Lab Results  Component Value Date   HDL 32.30 (L) 04/04/2017   HDL 27.80 (L) 10/16/2016   HDL 30.20 (L) 12/06/2015   Lab Results  Component Value Date   LDLCALC 118 (H) 09/16/2015   LDLCALC 126 (H) 04/08/2013   LDLCALC 51 02/28/2012   Lab Results  Component Value Date   TRIG 232.0 (H) 04/04/2017   TRIG 287.0 (H) 10/16/2016   TRIG 217.0 (H) 12/06/2015   Lab Results  Component Value Date   CHOLHDL 4 04/04/2017   CHOLHDL 4 10/16/2016   CHOLHDL 4 12/06/2015   Lab Results  Component Value Date   LDLDIRECT 61.0 04/04/2017   LDLDIRECT 52.0 10/16/2016   LDLDIRECT 69.0 12/06/2015                  Thyroid:   He has had hypothyroidism following radioactive iodine treatment for hyperthyroidism in 2013  TSH is usually normal with  dosage of 125 g   Lab Results  Component Value Date   TSH 3.62 02/05/2017   TSH 2.35 03/29/2016   TSH 3.58 09/16/2015   FREET4 0.74 02/05/2017   FREET4 0.82 08/20/2014   FREET4  0.98 11/20/2011       HYPERTENSION has been followed by  other physicians  Also on diuretics from cardiologist, currently on 80 mg Lasix  CKD: Has not seen nephrologist yet Renal dysfunction patterns as follows:   Lab Results  Component Value Date   CREATININE 2.02 (H) 04/04/2017   CREATININE 2.00 (H) 03/29/2017   CREATININE 1.77 (H) 02/18/2017     Diabetic foot exam in 03/2016 showed  normal monofilament sensation in the toes except decreased on the right great toe and plantar surfaces, no skin lesions or ulcers on the feet and normal pedal pulses    Physical Examination:  BP 102/62 (BP Location: Left Arm, Patient Position: Sitting, Cuff Size: Large)   Pulse 80   Ht '5\' 8"'$  (1.727 m)   Wt 260 lb 12.8 oz (118.3 kg)   SpO2 91%   BMI 39.65 kg/m     Left foot and big toe show no swelling   ASSESSMENT/PLAN  Diabetes type 2, uncontrolled with obesity     See history of present illness for detailed discussion of his current management, blood sugar patterns and problems identified  His A1c is 9.6  Likely he is having worse control with having to stop metformin because of renal dysfunction On an average blood sugars are about 270 now and high throughout the day  As discussed above the problems are related to poor diet, not changing his pump on time, insulin resistance and continued obesity This is despite taking Victoza also He is currently taking a total of 60 units basal with addition of Lantus since his last visit but fasting readings are consistently over 200 This is partly from overnight eating Most likely is not taking enough boluses for his meals also  RENAL dysfunction: This appears to be somewhat worse, he does think he is followed by a nephrologist  Recommendations:   Increase Lantus by 10 units  Continue to adjust this every 3 days until morning sugar is below 150  Continue Victoza 1.8  At least at suppertime will need to add 2 more units for his bolus  and his blood sugars are still over 200 after eating add additional 2 units  Try to improve diet with cutting back on carbohydrates and snacks  He is again unlikely to be able to handle the Omnipod insulin pump  Episode of gout: Because he is on diuretics and has renal failure he may have worsening hyperuricemia, last uric acid level was in 2012 and needs to be repeated  Hypothyroidism: Will have TSH on the next visit  Patient Instructions  5 clicks at supper at least at supper  Change pump at 8 pm daily  Lantus 30 units and adjust every 3 days till aAM SUGAR IN 100-150 RANGE       Elayne Snare 05/17/2017, 2:30 PM   Note: This office note was prepared with Dragon voice recognition system technology. Any transcriptional errors that result from this process are unintentional.

## 2017-05-17 NOTE — Patient Instructions (Signed)
5 clicks at supper at least at supper  Change pump at 8 pm daily  Lantus 30 units and adjust every 3 days till aAM SUGAR IN 100-150 RANGE

## 2017-05-20 ENCOUNTER — Other Ambulatory Visit: Payer: Self-pay | Admitting: Family Medicine

## 2017-05-20 MED ORDER — ALLOPURINOL 100 MG PO TABS: 50.0000 mg | ORAL_TABLET | Freq: Every day | ORAL | Status: DC

## 2017-05-21 ENCOUNTER — Telehealth: Payer: Self-pay | Admitting: Endocrinology

## 2017-05-21 ENCOUNTER — Other Ambulatory Visit: Payer: Self-pay

## 2017-05-21 ENCOUNTER — Telehealth: Payer: Self-pay | Admitting: Family Medicine

## 2017-05-21 DIAGNOSIS — M109 Gout, unspecified: Secondary | ICD-10-CM

## 2017-05-21 MED ORDER — FEBUXOSTAT 40 MG PO TABS: 40.0000 mg | ORAL_TABLET | Freq: Every day | ORAL | 0 refills | Status: DC

## 2017-05-21 MED ORDER — ALLOPURINOL 100 MG PO TABS
50.0000 mg | ORAL_TABLET | Freq: Every day | ORAL | 1 refills | Status: DC
Start: 2017-05-21 — End: 2017-07-29

## 2017-05-21 NOTE — Telephone Encounter (Signed)
Medication sent to pharmacy per request 

## 2017-05-21 NOTE — Telephone Encounter (Signed)
Dr Dwyane Dee requested the pt start uloric via lab results can we please call it into [facility] on [address] [phone]

## 2017-05-21 NOTE — Telephone Encounter (Signed)
Call pt.  Would change from uloric to allopurinol.  I talked with Dr. Dwyane Dee about this.  He agrees.   Stop uloric. Start allopurinol 50mg  a day.   Recheck uric acid in about 2 months.  Order put in.  Thanks.

## 2017-05-21 NOTE — Telephone Encounter (Signed)
Patient and his wife notified as instructed by telephone and verbalized understanding. Patient's wife stated that patient has not yet started the Uloric. Patient's wife stated that they will pick the Allopurinol up at the pharmacy and have him start taking it. Follow-up lab appointment scheduled.

## 2017-05-25 ENCOUNTER — Encounter: Payer: Self-pay | Admitting: Family Medicine

## 2017-05-25 ENCOUNTER — Ambulatory Visit: Payer: Self-pay

## 2017-05-25 ENCOUNTER — Other Ambulatory Visit: Payer: Self-pay | Admitting: Family Medicine

## 2017-05-25 ENCOUNTER — Ambulatory Visit (INDEPENDENT_AMBULATORY_CARE_PROVIDER_SITE_OTHER): Payer: Medicare Other | Admitting: Family Medicine

## 2017-05-25 VITALS — BP 116/64 | HR 88 | Temp 98.5°F | Ht 68.0 in | Wt 265.2 lb

## 2017-05-25 DIAGNOSIS — R3129 Other microscopic hematuria: Secondary | ICD-10-CM | POA: Diagnosis not present

## 2017-05-25 DIAGNOSIS — I255 Ischemic cardiomyopathy: Secondary | ICD-10-CM | POA: Diagnosis not present

## 2017-05-25 DIAGNOSIS — R300 Dysuria: Secondary | ICD-10-CM | POA: Diagnosis not present

## 2017-05-25 LAB — POC URINALSYSI DIPSTICK (AUTOMATED)
BILIRUBIN UA: NEGATIVE
GLUCOSE UA: NEGATIVE
KETONES UA: NEGATIVE
Leukocytes, UA: NEGATIVE
Nitrite, UA: NEGATIVE
PH UA: 6 (ref 5.0–8.0)
Protein, UA: NEGATIVE
Spec Grav, UA: 1.015 (ref 1.010–1.025)
Urobilinogen, UA: 0.2 E.U./dL

## 2017-05-25 NOTE — Assessment & Plan Note (Signed)
ua shows micro hematuria (per pt ? 2 weeks)  Sent for cx -tx if positive  Cannot r/o a recently passed stone  Alert if symptoms worsen   Keep f/u appts with renal/urol as well

## 2017-05-25 NOTE — Assessment & Plan Note (Signed)
In pt with dysuria (no other urine findings) and back pain which was much more severe earlier in the day Reasonable that he may have passed a kidney stone Also cannot r/o uti (also frequency but he has this baseline from BPH) Will cx urine  (will tx if pos)  Enc fluids  Tramadol for back pain as needed (also has chronic back pain)  Update if not starting to improve in a week or if worsening

## 2017-05-25 NOTE — Telephone Encounter (Signed)
Noted. Thanks.

## 2017-05-25 NOTE — Telephone Encounter (Signed)
Electronic refill request. Tramadol Last office visit:   05/25/17 (Acute with Dr. Glori Bickers) Last Filled:     60 tablet 1 03/12/2017  Please advise.

## 2017-05-25 NOTE — Telephone Encounter (Signed)
Pt has appt with Dr Glori Bickers 05/25/17 at 12:30. I spoke with Jeani Hawking at [facility] and they do not treat kidney stones. Pt will keep appt with [facility] as scheduled but will see Dr Glori Bickers today at 12:30. Pt pain level is 10 but said he could wait for appt; pt did not want to go to ED. FYI to Dr Glori Bickers.

## 2017-05-25 NOTE — Patient Instructions (Addendum)
Keep drinking fluids   Your urinalysis has blood in it (microscopic) - it is possible you passed a kidney stone or you could have an infection  We will send urine for a culture to see if it grows any bacteria   Keep [place] updated   Go to the ER if symptoms become severe   Continue tramadol as needed for back pain

## 2017-05-25 NOTE — Telephone Encounter (Signed)
I will see him then  Will cc to his pcp

## 2017-05-25 NOTE — Telephone Encounter (Signed)
Pt. C/o bilateral flank pain with burning and painful urination. Reports he has kidney stones in the past and "feels like this is what's going on." Rates his pain a "10". Back pain is worse with ambulation. Reports he has an appointment in 2 weeks with [facility]. Appointment made per Flow Coordinator, [place]. No availability with Dr. Damita Dunnings. Reason for Disposition . MODERATE pain (e.g., interferes with normal activities or awakens from sleep)  Answer Assessment - Initial Assessment Questions 1. LOCATION: "Where does it hurt?" (e.g., left, right)     Both sides 2. ONSET: "When did the pain start?"     Started this morning 3. SEVERITY: "How bad is the pain?" (e.g., Scale 1-10; mild, moderate, or severe)   - MILD (1-3): doesn't interfere with normal activities    - MODERATE (4-7): interferes with normal activities or awakens from sleep    - SEVERE (8-10): excruciating pain and patient unable to do normal activities (stays in bed)       10 4. PATTERN: "Does the pain come and go, or is it constant?"      Comes and goes 5. CAUSE: "What do you think is causing the pain?"     Maybe a kidney stone 6. OTHER SYMPTOMS:  "Do you have any other symptoms?" (e.g., fever, abdominal pain, vomiting, leg weakness, burning with urination, blood in urine)     Burning and pain with voiding 7. PREGNANCY:  "Is there any chance you are pregnant?" "When was your last menstrual period?"     No  Protocols used: FLANK PAIN-A-AH

## 2017-05-25 NOTE — Progress Notes (Signed)
Subjective:    Patient ID: Henry [name], male    DOB: [birth year], 67 y.o.   MRN: [id number]  HPI Here for back pain / ? Of poss kidney stone earlier   Having bilateral back /flank pain  Much worse in the past few days (has arthritis as well) -takes tramadol for that    Dysuria - several weeks  Has urgency and frequency - takes myrbetriq    No fever  No nausea  Does occ have a dizzy spell    67 yo pt of Dr Damita Dunnings with hx of HTN/CHF/DM/parkinsons and CKD3 As well as BPH  Lab Results  Component Value Date   CREATININE 2.26 (H) 05/17/2017   BUN 54 (H) 05/17/2017   NA 134 (L) 05/17/2017   K 3.5 05/17/2017   CL 93 (L) 05/17/2017   CO2 32 05/17/2017   ua Results for orders placed or performed in visit on 05/25/17  POCT Urinalysis Dipstick (Automated)  Result Value Ref Range   Color, UA Light Yellow    Clarity, UA Clear    Glucose, UA Negative    Bilirubin, UA Negative    Ketones, UA Negative    Spec Grav, UA 1.015 1.010 - 1.025   Blood, UA 200 Ery/uL    pH, UA 6.0 5.0 - 8.0   Protein, UA Negative    Urobilinogen, UA 0.2 0.2 or 1.0 E.U./dL   Nitrite, UA Negative    Leukocytes, UA Negative Negative    Hx of kidney stones  Perhaps last one was 2 y ago   Does not feel like he is passing a stone   Does drink a lot of fluid  Is not on a fluid restriction     Wt Readings from Last 3 Encounters:  05/25/17 265 lb 4 oz (120.3 kg)  05/17/17 260 lb 12.8 oz (118.3 kg)  05/03/17 257 lb (116.6 kg)   40.33 kg/m    Patient Active Problem List   Diagnosis Date Noted  . Dysuria 05/25/2017  . Microscopic hematuria 05/25/2017  . CKD (chronic kidney disease) stage 3, GFR 30-59 ml/min (HCC) 04/12/2017  . Neuropathy 02/25/2017  . Urinary frequency 09/19/2016  . Constipation 11/27/2015  . Medicare annual wellness visit, initial 09/23/2015  . Advance care planning 09/23/2015  . Spinal stenosis of lumbar region 01/26/2015  . Bite, insect 09/28/2014  . Restless leg  syndrome 08/11/2014  . ICD- MDT, implanted 11/13/13 11/14/2013  . Chronic systolic CHF (congestive heart failure) ([place]) 11/13/2013  . Acute on chronic combined systolic and diastolic congestive heart failure, NYHA class 4 ([place]) 05/12/2013  . 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 Elevated PSA 12/04/2012  . Other malaise and fatigue 12/04/2012  . PD (Parkinson's disease) ([place]) 08/19/2012  . OSA on CPAP 04/17/2012  . Dysautonomia ([place]) 04/17/2012  . Obesity, morbid ([place]) 04/17/2012  . Hypothyroidism following radioiodine therapy 10/16/2011  . DYSPNEA/SHORTNESS OF BREATH 11/09/2009  . GERD 05/11/2009  . Poorly controlled type 2 diabetes mellitus with circulatory disorder ([place]) 04/26/2009  . OTHER SPEC FORMS CHRONIC ISCHEMIC HEART DISEASE 04/24/2008  . HYPERPLASIA PROSTATE UNS W/O UR OBST & OTH LUTS 09/02/2007  . NEPHROLITHIASIS, HX OF 09/02/2007  . HERNIA, VENTRAL 12/28/2006  . Hyperlipidemia 08/31/2006  . HTN (hypertension) 08/31/2006  . Hx of CABG x 3 in 2000 08/31/2006   Past Medical History:  Diagnosis Date  . AICD (automatic cardioverter/defibrillator) present    Dr Lovena Le office visit yearly, MDT   . Arthritis    cane  .  Asthma   . Benign neoplasm of colon   . CAD (coronary artery disease)   . Cardiomyopathy   . Cataract   . Complication of anesthesia    pt states that he got a rash  . Constipation   . 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 DM (diabetes mellitus) ([place])    TYPE 2   . GERD (gastroesophageal reflux disease)   . Glaucoma    right eye  . HLD (hyperlipidemia)   . HTN (hypertension)    pt denies 08/19/12  . Hyperplasia, prostate   . Hyperthyroidism    thyroid lobectomy  . MI (myocardial infarction) [facility])    Dr Stanford Breed 2000, x3vessels bypass  . Nephrolithiasis   . OSA (obstructive sleep apnea)    AHI-28,on CPAP, noncompliant with CPAP  . Parkinson disease ([place])    1999  . PONV (postoperative nausea and vomiting)   . Restless  legs   . Shortness of breath    Hx: of at all times  . Sleep apnea    does not use CPAP  . Sleep apnea, organic   . UTI (lower urinary tract infection) 09/15/12   Klebsiella  . Ventral hernia    Past Surgical History:  Procedure Laterality Date  . 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 CARDIAC CATHETERIZATION    . CATARACT EXTRACTION W/ INTRAOCULAR LENS IMPLANT     Hx: of right eye  . CATARACT EXTRACTION W/ INTRAOCULAR LENS IMPLANT Left 2018  . COLONOSCOPY N/A 10/13/2014   Procedure: COLONOSCOPY;  Surgeon: Jerene Bears, MD;  Location: [facility] ENDOSCOPY;  Service: Gastroenterology;  Laterality: N/A;  . COLONOSCOPY W/ BIOPSIES AND POLYPECTOMY     Hx: of  . CORONARY ARTERY BYPASS GRAFT  2000   [name], MD  . [place]  . DEEP BRAIN STIMULATOR PLACEMENT  2004   Right and left VIN stimulator placement (parkinsons)  . EYE SURGERY    . FINGER AMPUTATION     left pointer  . IMPLANTABLE CARDIOVERTER DEFIBRILLATOR IMPLANT N/A 11/13/2013   Procedure: IMPLANTABLE CARDIOVERTER DEFIBRILLATOR IMPLANT;  Surgeon: Evans Lance, MD;  Location: [facility] CATH LAB;  Service: Cardiovascular;  Laterality: N/A;  . INSERT / REPLACE / REMOVE PACEMAKER    . LEFT AND RIGHT HEART CATHETERIZATION WITH CORONARY ANGIOGRAM N/A 09/24/2013   Procedure: LEFT AND RIGHT HEART CATHETERIZATION WITH CORONARY ANGIOGRAM;  Surgeon: Burnell Blanks, MD;  [facility];  Service: Cardiovascular;  Laterality: N/A;  . LITHOTRIPSY     3 different times  . MEDIAN STERNOTOMY  2000  . SUBTHALAMIC STIMULATOR BATTERY REPLACEMENT N/A 09/05/2012   Procedure: Deep brain stimulator battery change;  Surgeon: Erline Levine, MD;  Location: [place] NEURO ORS;  Service: Neurosurgery;  Laterality: N/A;  Deep brain stimulator battery change  . SUBTHALAMIC STIMULATOR BATTERY REPLACEMENT N/A 06/10/2015   Procedure: Deep Brain stimulator battery change;  Surgeon: Erline Levine, MD;  Location: [place] NEURO ORS;   Service: Neurosurgery;  Laterality: N/A;  . TONSILLECTOMY     Social History   Tobacco Use  . Smoking status: Never Smoker  . Smokeless tobacco: Never Used  Substance Use Topics  . Alcohol use: Yes    Comment: occasional beer  . Drug use: No   Family History  Problem Relation Age of Onset  . Aneurysm Mother   . Alcoholism Father   . HIV/AIDS Brother 27  AIDS  . Healthy Child   . Peripheral vascular disease Unknown   . Arthritis Unknown   . Healthy Child   . Healthy Child   . Diabetes Neg Hx   . Heart disease Neg Hx   . Colon cancer Neg Hx   . Prostate cancer Neg Hx    Allergies  Allergen Reactions  . Penicillins Rash and Other (See Comments)    WEARS ALLERGY BRACELET Because of a history of documented adverse serious drug reaction;Medi Alert bracelet  is recommended Has patient had a PCN reaction causing immediate rash, facial/tongue/throat swelling, SOB or lightheadedness with hypotension: Yes Has patient had a PCN reaction causing severe rash involving mucus membranes or skin necrosis: unknown Has patient had a PCN reaction that required hospitalization NO Has patient had a PCN reaction occurring within the last 10 years: NO If all of   . Peanut-Containing Drug Products Other (See Comments)    cough  . Watermelon [Citrullus Vulgaris]     Tickle in throat and cough   Current Outpatient Medications on File Prior to Visit  Medication Sig Dispense Refill  . acetaminophen (TYLENOL) 325 MG tablet Take 1-2 tablets (325-650 mg total) by mouth every 4 (four) hours as needed for mild pain.    Marland Kitchen albuterol (PROVENTIL HFA;VENTOLIN HFA) 108 (90 Base) MCG/ACT inhaler Inhale 2 puffs into the lungs every 6 (six) hours as needed for wheezing or shortness of breath. 18 g 5  . allopurinol (ZYLOPRIM) 100 MG tablet Take 0.5 tablets (50 mg total) by mouth daily. 45 tablet 1  . aspirin EC 81 MG tablet Take 81 mg by mouth daily.    . betamethasone dipropionate (DIPROLENE) 0.05 % cream  Apply topically 2 (two) times daily as needed. 45 g 1  . colchicine 0.6 MG tablet Take 2 tabs now, 1 tab this evening. Then take 1 tab PO BID until flare resolved 30 tablet 0  . Fluticasone-Salmeterol (ADVAIR) 250-50 MCG/DOSE AEPB Inhale 1 puff into the lungs 2 (two) times daily. 60 each 5  . furosemide (LASIX) 80 MG tablet TAKE 1 & 1/2 TABLETS DAILY 135 tablet 1  . gabapentin (NEURONTIN) 100 MG capsule Take 1-2 capsules (100-200 mg total) by mouth 3 (three) times daily as needed. (Patient taking differently: Take 100-200 mg by mouth 3 (three) times daily as needed. ) 90 capsule 0  . HYDROcodone-acetaminophen (NORCO/VICODIN) 5-325 MG tablet Take 1 tablet by mouth every 6 (six) hours as needed for moderate pain. 20 tablet 0  . Insulin Disposable Pump (V-GO 40) KIT Use one pod daily with insulin 90 kit 2  . Insulin Glargine (LANTUS SOLOSTAR) 100 UNIT/ML Solostar Pen Inject 20 Units into the skin daily at 10 pm. 10 pen 1  . insulin lispro (HUMALOG) 100 UNIT/ML injection USE MAX 56 UNITS PER DAY   WITH V-GO PUMP 60 mL 2  . Insulin Pen Needle (NOVOTWIST) 32G X 5 MM MISC USE AS DIRECTED EVERY DAY 100 each 2  . KLOR-CON M20 20 MEQ tablet TAKE 2 TABLETS (40 MEQ TOTAL) BY MOUTH DAILY. 180 tablet 3  . levothyroxine (SYNTHROID, LEVOTHROID) 125 MCG tablet TAKE 1 TABLET EVERY DAY DAILY BEFORE BREAKFAST 90 tablet 1  . lisinopril (PRINIVIL,ZESTRIL) 2.5 MG tablet TAKE 1 TABLET BY MOUTH DAILY. 90 tablet 3  . metoprolol succinate (TOPROL-XL) 50 MG 24 hr tablet TAKE 1 TABLET BY MOUTH DAILY. TAKE WITH OR IMMEDIATELY FOLLOWING A MEAL. 90 tablet 0  . Multiple Vitamin (MULTIVITAMIN WITH MINERALS) TABS tablet Take 1  tablet by mouth daily.     Marland Kitchen MYRBETRIQ 50 MG TB24 tablet Take 50 mg by mouth daily.  11  . ONETOUCH DELICA LANCETS 07P MISC Use to check blood sugar 2 times per day dx code E11.49 100 each 5  . ONETOUCH VERIO test strip USE AS DIRECTED TO CHECK BLOOD SUGAR TWICE A DAY 100 each 3  . Pramipexole Dihydrochloride  1.5 MG TB24 TAKE 1 TABLET DAILY 90 tablet 1  . rosuvastatin (CRESTOR) 20 MG tablet Take 1 tablet (20 mg total) daily by mouth. 90 tablet 3  . traMADol (ULTRAM) 50 MG tablet TAKE 1 TABLET TWICE A DAY AS NEEDED 60 tablet 1  . travoprost, benzalkonium, (TRAVATAN) 0.004 % ophthalmic solution Place 1 drop into the right eye at bedtime.     . trihexyphenidyl (ARTANE) 2 MG tablet TAKE 1 TABLET BY MOUTH EVERY DAY 90 tablet 1  . VICTOZA 18 MG/3ML SOPN INJECT 1.8MG ONCE DAILY 27 mL 1  . vitamin B-12 (CYANOCOBALAMIN) 1000 MCG tablet Take 1,000 mcg by mouth daily.    . metolazone (ZAROXOLYN) 2.5 MG tablet Take 1 tablet (2.5 mg total) by mouth daily. PRN FOR > 3 LB IN A DAY OR >5 LB IN WEEK 30 tablet 1  . nitroGLYCERIN (NITROSTAT) 0.4 MG SL tablet Place 1 tablet (0.4 mg total) under the tongue every 5 (five) minutes as needed for chest pain. 25 tablet 2   No current facility-administered medications on file prior to visit.      Review of Systems  Constitutional: Positive for fatigue. Negative for activity change, appetite change, fever and unexpected weight change.  HENT: Negative for congestion, rhinorrhea, sore throat and trouble swallowing.   Eyes: Negative for pain, redness, itching and visual disturbance.  Respiratory: Negative for cough, chest tightness, shortness of breath and wheezing.   Cardiovascular: Negative for chest pain and palpitations.  Gastrointestinal: Negative for abdominal pain, blood in stool, constipation, diarrhea, nausea, rectal pain and vomiting.  Endocrine: Negative for cold intolerance, heat intolerance, polydipsia and polyuria.  Genitourinary: Positive for dysuria, flank pain, frequency and urgency. Negative for decreased urine volume, difficulty urinating, hematuria and testicular pain.  Musculoskeletal: Negative for arthralgias, joint swelling and myalgias.  Skin: Negative for pallor and rash.  Neurological: Positive for tremors. Negative for dizziness, weakness, numbness  and headaches.       Parkinsons  Hematological: Negative for adenopathy. Does not bruise/bleed easily.  Psychiatric/Behavioral: Negative for decreased concentration and dysphoric mood. The patient is not nervous/anxious.        Objective:   Physical Exam  Constitutional: He appears well-developed and well-nourished. No distress.  obese and well appearing   Comfortable-not in distress or pain   HENT:  Head: Normocephalic and atraumatic.  Eyes: Pupils are equal, round, and reactive to light. Conjunctivae and EOM are normal.  Neck: Normal range of motion. Neck supple.  Cardiovascular: Normal rate, regular rhythm and normal heart sounds.  Pulmonary/Chest: Effort normal and breath sounds normal.  Abdominal: Soft. Bowel sounds are normal. He exhibits no distension. There is no tenderness. There is no rebound.  No cva tenderness  no suprapubic tenderness  Musculoskeletal: He exhibits no edema.  Tender over upper lumbar spine and mildly in lumbar musculature -more on the R  No cva tenderness  Lymphadenopathy:    He has no cervical adenopathy.  Neurological: He is alert.  Speech is slow and sometimes hard to interpret  Family helps with hx   Tremor noted   Skin: No rash  noted.  Psychiatric: He has a normal mood and affect.          Assessment & Plan:   Problem List Items Addressed This Visit      Genitourinary   Microscopic hematuria    In pt with dysuria (no other urine findings) and back pain which was much more severe earlier in the day Reasonable that he may have passed a kidney stone Also cannot r/o uti (also frequency but he has this baseline from BPH) Will cx urine  (will tx if pos)  Enc fluids  Tramadol for back pain as needed (also has chronic back pain)  Update if not starting to improve in a week or if worsening         Relevant Orders   Urine Culture     Other   Dysuria - Primary    ua shows micro hematuria (per pt ? 2 weeks)  Sent for cx -tx if  positive  Cannot r/o a recently passed stone  Alert if symptoms worsen   Keep f/u appts with renal/urol as well       Relevant Orders   POCT Urinalysis Dipstick (Automated) (Completed)   Urine Culture

## 2017-05-26 LAB — URINE CULTURE
MICRO NUMBER: 90603756
SPECIMEN QUALITY:: ADEQUATE

## 2017-05-27 NOTE — Telephone Encounter (Signed)
Sent. Thanks.   

## 2017-05-28 ENCOUNTER — Other Ambulatory Visit: Payer: Self-pay | Admitting: Family Medicine

## 2017-05-28 NOTE — Telephone Encounter (Signed)
Electronic refill request Last office visit 05/25/17/acute Last refill 02/22/17 #90

## 2017-05-29 NOTE — Telephone Encounter (Signed)
Sent. Thanks.   

## 2017-06-05 ENCOUNTER — Ambulatory Visit (INDEPENDENT_AMBULATORY_CARE_PROVIDER_SITE_OTHER): Payer: Medicare Other | Admitting: Family Medicine

## 2017-06-05 ENCOUNTER — Encounter: Payer: Self-pay | Admitting: Family Medicine

## 2017-06-05 DIAGNOSIS — M109 Gout, unspecified: Secondary | ICD-10-CM

## 2017-06-05 DIAGNOSIS — I255 Ischemic cardiomyopathy: Secondary | ICD-10-CM

## 2017-06-05 MED ORDER — COLCHICINE 0.6 MG PO TABS: ORAL_TABLET | ORAL | 1 refills | Status: DC

## 2017-06-05 NOTE — Progress Notes (Signed)
He had been better but then had return of gout in the last few days.  He is on 50mg  allopurinol but out of colchicine. L>R foot pain.  Pain with the sheet laying on the L foot at night.  No trauma.  Still on baseline meds otherwise.  No fevers.   Meds, vitals, and allergies reviewed.   ROS: Per HPI unless specifically indicated in ROS section   nad ncat Trace BLE edema.   left foot with intact dorsalis pedis pulse.  He has mild diffuse erythema near the left first MTP.  Significantly tender with light palpation.

## 2017-06-05 NOTE — Patient Instructions (Addendum)
Continue allopurinol 50mg  a day (1/2 tab). Take colchicine twice a day if needed. When better, cut back to 1 pill a day and then stop when better.   The goal is to take allopurinol daily so that you'll need less colchicine in the long rung (and have less flares and pain).  Recheck labs in July.   Take care.  Glad to see you.

## 2017-06-06 DIAGNOSIS — M109 Gout, unspecified: Secondary | ICD-10-CM | POA: Insufficient documentation

## 2017-06-06 NOTE — Assessment & Plan Note (Signed)
Likely gout, does not appear infectious.  No recent trauma.  Okay for outpatient follow-up.  Rationale for treatment discussed with patient.  Continue allopurinol 50mg  a day (1/2 tab).Take colchicine twice a day if needed. When better, cut back to 1 pill a day and then stop when resolved.   The goal is to take allopurinol daily so that he'll need less colchicine in the long rung (and have less flares and pain).  Recheck labs in July.  He agrees.

## 2017-06-18 ENCOUNTER — Other Ambulatory Visit: Payer: Self-pay | Admitting: Neurology

## 2017-06-20 ENCOUNTER — Other Ambulatory Visit: Payer: Self-pay | Admitting: Student

## 2017-06-22 ENCOUNTER — Other Ambulatory Visit: Payer: Self-pay | Admitting: Endocrinology

## 2017-06-22 ENCOUNTER — Other Ambulatory Visit: Payer: Self-pay | Admitting: Neurology

## 2017-06-25 ENCOUNTER — Other Ambulatory Visit: Payer: Self-pay

## 2017-06-25 ENCOUNTER — Encounter: Payer: Self-pay | Admitting: Neurology

## 2017-06-25 ENCOUNTER — Encounter: Payer: Self-pay | Admitting: Endocrinology

## 2017-06-25 DIAGNOSIS — D631 Anemia in chronic kidney disease: Secondary | ICD-10-CM | POA: Diagnosis not present

## 2017-06-25 DIAGNOSIS — R319 Hematuria, unspecified: Secondary | ICD-10-CM | POA: Diagnosis not present

## 2017-06-25 DIAGNOSIS — N183 Chronic kidney disease, stage 3 (moderate): Secondary | ICD-10-CM | POA: Diagnosis not present

## 2017-06-25 DIAGNOSIS — I129 Hypertensive chronic kidney disease with stage 1 through stage 4 chronic kidney disease, or unspecified chronic kidney disease: Secondary | ICD-10-CM | POA: Diagnosis not present

## 2017-06-25 MED ORDER — TRIHEXYPHENIDYL HCL 2 MG PO TABS: 2.0000 mg | ORAL_TABLET | Freq: Every day | ORAL | 0 refills | Status: DC

## 2017-06-25 MED ORDER — INSULIN PEN NEEDLE 32G X 4 MM MISC: 5 refills | Status: DC

## 2017-06-28 NOTE — Progress Notes (Signed)
Henry [name] was seen today in the movement disorders clinic for neurologic consultation at the request of [name].  His PCP is Tonia Ghent, MD.  The consultation is for the evaluation of PD and to manage his DBS.  He is accompanied by his wife who supplements the history.  The first symptom(s) the patient noticed was right hand tremor in 1999.  He was seen by neurology and was dx with PD.  He was placed on something, but it caused sleepiness.  He does not think that he has ever been on levodopa.   He was placed on Mirapex, which seemed to help.  He only takes it twice per day.  He didn't think it made a difference when he took it tid.  He began to have tremor and it was suggested he do DBS.    The pt is s/p stn DBS in 2005.  He had a battery change in 2009.    10/02/12 update: The patient is accompanied today by his daughter, who supplements the history.  I reviewed medical records available to me since last visit.  The patient had his generator changed on 09/05/2012.  He remains on pramipexole, 0.5 mg twice per day.  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 [name] in that regard.  He is not compliant with CPAP so says that he doesn't want to return to her for f/u. He doesn't take the klonopin faithfully.  10/11/12 update:  Pt was seen as a walk in/work in today.  Pt had increasing tremor, L greater than R for 3 days.  Thinks that it started with the d/c of artane.  Speech stable.  11/19/12 update:  Pt is seen today for his PD, accompanied by his daughter who supplements the hx.    He is currently on klonopin 0.5 mg - 1/2-1 tablet q hs.  He only takes it when his wife is not working.  She works nights and is only in 2 days per week.  He has some reluctance to take it other nights.  He is on pramipexole 0.5 mg bid.  Last visit, his DBS was reset more similar to the settings he had prior to coming here, just with an increased voltage.  About 2 weeks after our last  visit, the patient decided to go back on the Artane.  The combination of the Artane and the DBS changes helped significantly.  He does ask if I can slightly increased voltage on the left hand, as he has some tremor at night that is bothersome.  Otherwise, he is doing well.  He feels that his balance has been great.  02/19/13 update:  This patient is accompanied in the office by his child who supplements the history.  The pt has a hx of PD.  He has been tremor free and is very happy about that.  He has had some increased balance loss; the last fall was a few months ago but he hasn't gotten hurt.  Was considering hernia surgery.  The records that were made available to me were reviewed.  He is holding on that for now but plans to have it done before end of year.  No hallucinations.  He is still having some acting out of the dreams, despite clonazepam.  06/19/13 update:  Patient is returning to followup regarding his Parkinson's disease.  I had the opportunity to review records since last visit.  He went to the emergency room on  05/12/2013 with chest pain and shortness of breath.  He had been fishing and had missed several doses of Lasix.  He had been eating seafood.  He ended up with an acute exacerbation of his chronic congestive heart failure.  Once he was diuresed and back on his medications, he is feeling better.  He just had a nuclear medicine study done on 06/17/2013 and the ejection fraction on this looked better than on his echocardiogram.  The left ventricular ejection fraction was 41%.  Prior to that, there was some concern that his left ventricular ejection fraction had dropped so much that he may need an ICD.  In terms of Parkinson's disease, the patient states that he has been doing very well in terms of tremor, but his walking really has deteriorated.  He describes a festinating gait, with much more shuffling.  He fell walking over his dog gate but otherwise has not had falls.  He went fishing  yesterday and states that he "stumbled all day long."  No hallucinations.  He has been trying to be more faithful with his CPAP. He remains on Mirapex 0.5 mg twice a day, Artane 2 mg twice a day.  10/23/13 update:  The patient returns today to the clinic, accompanied by his wife who supplements the history.  From a Parkinson's standpoint, the patient states that he has been doing very well.  No tremor.  He started on levodopa last visit and states that he has had no falls and overall stumbling has been much better.  He states that the only time that he stumbles is if he is inside of his fishing boat, and states that that is really because it is small and unstable.  He is taking his levodopa in the morning, after lunch and bedtime.  He has had no hallucinations.  His wife states that he is still draining and acting out the dreams and even fell out of bed a few days ago.  However, he is taking his clonazepam about 10 PM but doesn't go to bed until 1 AM.  He is not using his CPAP.  Is having significant constipation.   I reviewed his cardiology records since last visit.  He did have a heart catheterization and is scheduled to have an ICD placed on November 5.  02/23/14 update:  Pt returns for follow up accompanied by his wife who supplements the history.  The records that were made available to me were reviewed since last visit.  He had an ICD placed on 11/15/13.  He is doing well.  Taking carbidopa/levodopa 25/100 three times a day and remains on artane as well as mirapex 0.5 mg bid.  Had a fall up the stairs.  Wife describes festinating gate and pt thinks that levodopa contributes.  He also doesn't feel good much of the time and thinks that is from levodopa.  Admits, however, that when BS under good control, he feels good.  Has started seeing endocrinology and started on new meds.  He has klonopin for RBD.  Still vivid dreams but no falling out bed (in recliner now).  Having some word finding trouble and memory  doesn't seem clear.  Also, put back tailgate down on car and came down on generator and wants me to look at that.    07/01/14 update:  The patient follows up today regarding his Parkinson's disease.  He is accompanied by his wife who supplements the history.  Last visit, the patient was complaining about memory change which  I thought was likely multifactorial and due to medication such as Artane, Mirapex and Ultram, which he was taking every 3 hours.  The patient, however, thought that it was from levodopa and so we held it and the patient reported that he felt better and therefore continued to stay off of the medication.  He reports that falls are better after d/c levodopa but wife reports still falls.  Pt states that he only fell in shower after he d/c levodopa.  His wife does admit that she thinks that levodopa was causing loss of balance.  He is having more tremor over the last month on the L hand.   He remains on clonazepam 0.5 mg, 1-1/2 tablets at night for REM behavior disorder.  I reviewed records since last visit.  He has seen Dr. Dwyane Dee multiple times in regards to his uncontrolled diabetes.  He started on insulin on May 22 2014.  He states that he is doing well with injecting himself.  Having a lot of constipation.  Has the rancho recipe.    11/03/14 update:  The patient is following up today, accompanied by his wife who supplements the history.  Records were reviewed since our last visit.  The patient is on pramipexole 0.5 mg twice a day and Artane 2 mg twice a day.  He has not had any hallucinations.  Had a fall the other day walking up stairs carrying something and fell backward.  Hit his back.  No LOC.  Also fell backward in the summer around the pool.  [place] one time out of his boat.  Doesn't want to use a walker. He was in the emergency room recently with a rash that was felt secondary to insect bites that he got in the woods.  He also recently had a colonoscopy.  His diabetes is under better  control and his last A1c was 7.2.  States that he now has an insulin pump.  He remains on clonazepam 0.5 mg, 1-1/2 tablets at night for REM behavior disorder.  More back pain lately and will start injections for the back which have helped previously  02/04/15 update:  The patient presents today, accompanied by his wife who supplements the history.  I have reviewed prior records made available to me.  He has established a new primary care doctor in Dr. Damita Dunnings.  The patient remains on Artane, 2 mg twice a day as well as pramipexole 0.5 mg twice a day.  Overall, he has been doing fairly well.  He occasionally still has some tremor.  He had 2 falls since our last visit; he states that he was pushing a gate open and it came back and hit him.  He was also carrying wood the other day and fell forward with wood.  No hallucinations.  No lightheadedness or near syncope.  He remains on clonazepam 0.5 mg, 1-1/2 tablets at night for REM behavior disorder.  [facility] is [facility] at [facility].    06/08/15 update:  This patient is accompanied in the office by his spouse who supplements the history.  The patient remains on Artane, 2 mg twice a day as well as pramipexole 0.5 mg twice a day.  He tripped over the curb at [employer].  He tripped over the step on his deck.   He remains on clonazepam 0.5 mg, 1-1/2 tablets at night for REM behavior disorder.  He is still having trouble with that.  He fell out of bed the other night.  Wife asks about bed rails.   He felt that [facility] helped.  Is looking forward to getting in his pool.    09/09/15 update:  The patient follows up today, accompanied by his wife who supplements the history.  He is on pramipexole 0.5 g twice a day and trihexyphenidyl 2 mg twice a day.  Didn't go up to the 1.5 mg of pramipexole.   He is on clonazepam 0.5 mg, 1-1/2 tablets at night for REM behavior disorder.  His biggest issue is RLS at bedtime.  It is preventing him from going to sleep.    He denies any hallucinations.  He denies cognitive change.  He continues to struggle with balance and has fallen/stumbled a few times.  He has fallen in the boat.  Wife describes festinating gait.  Wife also describes crying frequently.  Denies depression. He has his battery changed on 06/10/15  12/18/15 update:  Pt is following up today, accompanied by his wife who supplements the history.  Last visit, I changed him from pramipexole, 0.5 mg twice per day to Mirapex ER, 1.5 mg once per day.  He states that it is very expensive.  He remains on trihexyphenidyl, 2 mg twice per day.  He is also on clonazepam 0.5 mg, 1-1/2 tablets at night for REM behavior disorder. It turns out he was taking this at 6pm.   I reviewed records since last visit.  He reports that his pharmacist to come off his Aldactone because of an interaction with his Parkinson's medicine.  I am unaware of any interaction with his current medications.  Been having issues with constipation, which has been treated by Dr. Damita Dunnings.  [facility] is in [facility].  When asked about falls, his wife states that he falls all the time but he is more conscious of festinating gait now that he is in PT.  Not gotten hurt with falls.    07/13/16 update:  Pt f/u today for PD, accompanied by his wife, who supplements the history.  The records that were made available to me were reviewed since last visit.  On pramipexole ER 1.5 mg daily and artane 2 mg bid.  Was on klonopin for RBD but he d/c the medication.  He thought that it caused insomnia.  He is still acting out the dreams.  He won't use his CPAP machine.  States that he is afraid of it malfunctioning.  Often will sleep in the recliner.  Having more back pain.  Having injections and 3rd is next week at [facility].    Wearing off:  No.  How long before next dose:  n/a Falls:   Yes.  , 12 months but some of these were falls out of bed in RBD N/V:  No. Hallucinations:  Yes.   (none visual but few auditory and has  gotten out of bed to see who was there)  visual distortions: No. Lightheaded:  No.  Syncope: No. Dyskinesia:  No.   12/15/16 update: Patient seen today in follow-up for his Parkinson's disease.  He is accompanied by his wife who supplements the history.  He remains on pramipexole ER, 1.5 mg daily.  He is also on trihexyphenidyl, 2 mg.  He was taking bid but he looked at his bottle and realized he was supposed to be taking it qd and just started doing that.  He has not had any hallucinations, except a few arising in the middle of the night out of a vivid dream.  No compulsive  behaviors.  I have reviewed records made available to me from his other providers, including his endocrinologist, cardiologist and primary care physician.  Off of klonopin.  "If I take it I sleep the entire next day."  He is having vivid dreams.  States that he has not fallen that much until last night.  Wife describes festinating gait.  Having more trouble in the shower.  A shower chair will not fit in his shower.  They have a small corner seat, but he states that he cannot wash his feet if he sits in it.  It is too slippery.  He is a currently attending [facility] through [facility] for his low back pain.  He does like that.  04/20/17 update: Patient is seen today in follow-up for his Parkinson's disease.  Patient is accompanied by his wife who supplements the history.  Patient is on pramipexole, 1.5 mg daily.  He is also on Artane, 2 mg daily.  No hallucinations.  No lightheadedness or near syncope.  I have talked to his primary care physician about him.  Primary care wanted to get an MRI of his brain due to hearing loss. 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  Discussed with his primary care physician that if he needed MRI, his device is MRI compatible for head MRI, but would need [employer] rep there to turn off the device and make sure  impedances were normal the day of MRI. Has new hearing aid as of today and doing well with that.   [place] today and tried to get off of toilet and hit door knob and fell.  Still going fishing and friend helps him navigate the water.  Went yesterday. Having more renal dysfunction and urinary urgency and dysuria.  No UTI per urology and placed on myrbetriq.  Has referral to nephrology but has not heard from them yet.  06/29/17 update: Patient is seen today in follow-up for Parkinson's.  We tried to discontinue his trihexyphenidyl, but they felt that his walking got worse after discontinuing the medication.  It was restarted, but I told them to trial off of it again as trihexyphenidyl generally has a negative effect on walking and not positive.  He ended up staying on the medication.  Overall, they do think that balance has been worse as has been acting out of the dreams.  He has had 2-3 falls.  He is having freezing.  Battery site sore since fall.   He describes hallucinations but they arise out of the sleep.  He will start dreaming and it feels real.  He states that it goes away when wakes up.  Wife isn't so sure.  He is tried clonazepam but that it made him too sleepy.  wife states that she restarted klonopin a week ago, 0.5 mg, and wife thinks that it has helped sleep but still having a bit of that.  He is still on pramipexole ER, 1.5 mg daily.     PREVIOUS MEDICATIONS: Mirapex and artane, levodopa (made "too loose"); clonazepam (overly sleepy)  ALLERGIES:   Allergies  Allergen Reactions  . Penicillins Rash and Other (See Comments)    WEARS ALLERGY BRACELET Because of a history of documented adverse serious drug reaction;Medi Alert bracelet  is recommended Has patient had a PCN reaction causing immediate rash, facial/tongue/throat swelling, SOB or lightheadedness with hypotension: Yes Has patient had a  PCN reaction causing severe rash involving mucus membranes or skin necrosis: unknown Has patient had a  PCN reaction that required hospitalization NO Has patient had a PCN reaction occurring within the last 10 years: NO If all of   . Peanut-Containing Drug Products Other (See Comments)    cough  . Watermelon [Citrullus Vulgaris]     Tickle in throat and cough    CURRENT MEDICATIONS:   Allergies as of 06/29/2017      Reactions   Penicillins Rash, Other (See Comments)   WEARS ALLERGY BRACELET Because of a history of documented adverse serious drug reaction;Medi Alert bracelet  is recommended Has patient had a PCN reaction causing immediate rash, facial/tongue/throat swelling, SOB or lightheadedness with hypotension: Yes Has patient had a PCN reaction causing severe rash involving mucus membranes or skin necrosis: unknown Has patient had a PCN reaction that required hospitalization NO Has patient had a PCN reaction occurring within the last 10 years: NO If all of    Peanut-containing Drug Products Other (See Comments)   cough   Watermelon [citrullus Vulgaris]    Tickle in throat and cough      Medication List        Accurate as of 06/29/17  2:53 PM. Always use your most recent med list.          acetaminophen 325 MG tablet Commonly known as:  TYLENOL Take 1-2 tablets (325-650 mg total) by mouth every 4 (four) hours as needed for mild pain.   albuterol 108 (90 Base) MCG/ACT inhaler Commonly known as:  PROVENTIL HFA;VENTOLIN HFA Inhale 2 puffs into the lungs every 6 (six) hours as needed for wheezing or shortness of breath.   allopurinol 100 MG tablet Commonly known as:  ZYLOPRIM Take 0.5 tablets (50 mg total) by mouth daily.   aspirin EC 81 MG tablet Take 81 mg by mouth daily.   carbidopa-levodopa 25-100 MG tablet Commonly known as:  SINEMET IR Take 1 tablet by mouth 3 (three) times daily.   clonazePAM 0.5 MG tablet Commonly known as:  KLONOPIN Take 0.5 mg by mouth at bedtime.   colchicine 0.6 MG tablet Take 1 tab twice a day if needed for gout.     Fluticasone-Salmeterol 250-50 MCG/DOSE Aepb Commonly known as:  ADVAIR Inhale 1 puff into the lungs 2 (two) times daily.   furosemide 80 MG tablet Commonly known as:  LASIX TAKE 1 & 1/2 TABLETS DAILY   gabapentin 100 MG capsule Commonly known as:  NEURONTIN TAKE 1 TO 2 CAPSULES (100-200 MG TOTAL) BY MOUTH 3 (THREE) TIMES DAILY AS NEEDED.   Insulin Glargine 100 UNIT/ML Solostar Pen Commonly known as:  LANTUS SOLOSTAR Inject 20 Units into the skin daily at 10 pm.   insulin lispro 100 UNIT/ML injection Commonly known as:  HUMALOG USE MAX 56 UNITS PER DAY   WITH V-GO PUMP   Insulin Pen Needle 32G X 5 MM Misc Commonly known as:  NOVOTWIST USE AS DIRECTED EVERY DAY   Insulin Pen Needle 32G X 4 MM Misc Used to give daily insulin injections.   KLOR-CON M20 20 MEQ tablet Generic drug:  potassium chloride SA TAKE 2 TABLETS (40 MEQ TOTAL) BY MOUTH DAILY.   levothyroxine 125 MCG tablet Commonly known as:  SYNTHROID, LEVOTHROID TAKE 1 TABLET EVERY DAY DAILY BEFORE BREAKFAST   lisinopril 2.5 MG tablet Commonly known as:  PRINIVIL,ZESTRIL TAKE 1 TABLET BY MOUTH DAILY.   metolazone 2.5 MG tablet Commonly known as:  ZAROXOLYN TAKE 1  TABLET BY MOUTH EVERY DAY AS NEEDED FOR >3 LB IN A DAY OR >5 LB IN A WEEK   metoprolol succinate 50 MG 24 hr tablet Commonly known as:  TOPROL-XL TAKE 1 TABLET BY MOUTH DAILY. TAKE WITH OR IMMEDIATELY FOLLOWING A MEAL.   multivitamin with minerals Tabs tablet Take 1 tablet by mouth daily.   MYRBETRIQ 50 MG Tb24 tablet Generic drug:  mirabegron ER Take 50 mg by mouth daily.   nitroGLYCERIN 0.4 MG SL tablet Commonly known as:  NITROSTAT Place 1 tablet (0.4 mg total) under the tongue every 5 (five) minutes as needed for chest pain.   ONETOUCH DELICA LANCETS 01S Misc Use to check blood sugar 2 times per day dx code E11.49   ONETOUCH VERIO test strip Generic drug:  glucose blood USE AS DIRECTED TO CHECK BLOOD SUGAR TWICE A DAY   pramipexole  0.25 MG tablet Commonly known as:  MIRAPEX Take 1 tablet (0.25 mg total) by mouth 3 (three) times daily.   rosuvastatin 20 MG tablet Commonly known as:  CRESTOR Take 1 tablet (20 mg total) daily by mouth.   traMADol 50 MG tablet Commonly known as:  ULTRAM TAKE 1 TABLET BY MOUTH TWICE A DAY AS NEEDED   travoprost (benzalkonium) 0.004 % ophthalmic solution Commonly known as:  TRAVATAN Place 1 drop into the right eye at bedtime.   trihexyphenidyl 2 MG tablet Commonly known as:  ARTANE Take 1 tablet (2 mg total) by mouth daily.   V-GO 40 Kit Use one pod daily with insulin   VICTOZA 18 MG/3ML Sopn Generic drug:  liraglutide INJECT 1.8MG ONCE DAILY   vitamin B-12 1000 MCG tablet Commonly known as:  CYANOCOBALAMIN Take 1,000 mcg by mouth daily.        PAST MEDICAL HISTORY:   Past Medical History:  Diagnosis Date  . AICD (automatic cardioverter/defibrillator) present    Dr Lovena Le office visit yearly, MDT   . Arthritis    cane  . Asthma   . Benign neoplasm of colon   . CAD (coronary artery disease)   . Cardiomyopathy   . Cataract   . Complication of anesthesia    pt states that he got a rash  . Constipation   . 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 DM (diabetes mellitus) ([place])    TYPE 2   . GERD (gastroesophageal reflux disease)   . Glaucoma    right eye  . HLD (hyperlipidemia)   . HTN (hypertension)    pt denies 08/19/12  . Hyperplasia, prostate   . Hyperthyroidism    thyroid lobectomy  . MI (myocardial infarction) [facility])    Dr Stanford Breed 2000, x3vessels bypass  . Nephrolithiasis   . OSA (obstructive sleep apnea)    AHI-28,on CPAP, noncompliant with CPAP  . Parkinson disease ([place])    1999  . PONV (postoperative nausea and vomiting)   . Restless legs   . Shortness of breath    Hx: of at all times  . Sleep apnea    does not use CPAP  . Sleep apnea, organic   . UTI (lower urinary tract infection) 09/15/12   Klebsiella  . Ventral  hernia     PAST SURGICAL HISTORY:   Past Surgical History:  Procedure Laterality Date  . 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 CARDIAC CATHETERIZATION    . CATARACT EXTRACTION W/ INTRAOCULAR LENS IMPLANT     Hx: of right  eye  . CATARACT EXTRACTION W/ INTRAOCULAR LENS IMPLANT Left 2018  . COLONOSCOPY N/A 10/13/2014   Procedure: COLONOSCOPY;  Surgeon: Jerene Bears, MD;  Location: [facility];  Service: Gastroenterology;  Laterality: N/A;  . COLONOSCOPY W/ BIOPSIES AND POLYPECTOMY     Hx: of  . CORONARY ARTERY BYPASS GRAFT  2000   [name], MD  . [place]  . DEEP BRAIN STIMULATOR PLACEMENT  2004   Right and left VIN stimulator placement (parkinsons)  . EYE SURGERY    . FINGER AMPUTATION     left pointer  . IMPLANTABLE CARDIOVERTER DEFIBRILLATOR IMPLANT N/A 11/13/2013   Procedure: IMPLANTABLE CARDIOVERTER DEFIBRILLATOR IMPLANT;  Surgeon: Evans Lance, MD;  Location: [facility] CATH LAB;  Service: Cardiovascular;  Laterality: N/A;  . INSERT / REPLACE / REMOVE PACEMAKER    . LEFT AND RIGHT HEART CATHETERIZATION WITH CORONARY ANGIOGRAM N/A 09/24/2013   Procedure: LEFT AND RIGHT HEART CATHETERIZATION WITH CORONARY ANGIOGRAM;  Surgeon: Burnell Blanks, MD;  Location: [facility] CATH LAB;  Service: Cardiovascular;  Laterality: N/A;  . LITHOTRIPSY     3 different times  . MEDIAN STERNOTOMY  2000  . SUBTHALAMIC STIMULATOR BATTERY REPLACEMENT N/A 09/05/2012   Procedure: Deep brain stimulator battery change;  Surgeon: Erline Levine, MD;  Location: [place] [facility];  Service: Neurosurgery;  Laterality: N/A;  Deep brain stimulator battery change  . SUBTHALAMIC STIMULATOR BATTERY REPLACEMENT N/A 06/10/2015   Procedure: Deep Brain stimulator battery change;  Surgeon: Erline Levine, MD;  Location: [place] [facility];  Service: Neurosurgery;  Laterality: N/A;  . TONSILLECTOMY      SOCIAL HISTORY:   Social History   Socioeconomic History  . Marital status: Married    Spouse  name: [name]  . Number of children: 2  . Years of education: Not on file  . Highest education level: Not on file  Occupational History  . Occupation: DISABLED    Comment: [employer]  Social Needs  . Financial resource strain: Not on file  . Food insecurity:    Worry: Not on file    Inability: Not on file  . Transportation needs:    Medical: Not on file    Non-medical: Not on file  Tobacco Use  . Smoking status: Never Smoker  . Smokeless tobacco: Never Used  Substance and Sexual Activity  . Alcohol use: Yes    Comment: occasional beer  . Drug use: No  . Sexual activity: Not on file  Lifestyle  . Physical activity:    Days per week: Not on file    Minutes per session: Not on file  . Stress: Not on file  Relationships  . Social connections:    Talks on phone: Not on file    Gets together: Not on file    Attends religious service: Not on file    Active member of club or organization: Not on file    Attends meetings of clubs or organizations: Not on file    Relationship status: Not on file  . Intimate partner violence:    Fear of current or ex partner: Not on file    Emotionally abused: Not on file    Physically abused: Not on file    Forced sexual activity: Not on file  Other Topics Concern  . Not on file  Social History Narrative   From [facility]   Retired/disability Clinical research associate   Likes to fish.     Married 1972   3 kids  [place] fan    FAMILY HISTORY:   Family Status  Relation Name Status  . Mother  Deceased       complications of surgery  . Father  Deceased  . Brother  Alive       1 brother, 1 half brother  . Sister  Alive       2 half sisters  . Child  Alive  . Unknown  (Not Specified)  . MGM  Deceased  . MGF  Deceased  . PGM  Deceased  . PGF  Deceased  . Sister  Alive  . Brother  Alive  . Child  Alive  . Child  Alive  . Neg Hx  (Not Specified)    ROS:  Review of Systems  Constitutional: Negative.   HENT:  Negative.   Eyes: Negative.   Respiratory: Negative.   Cardiovascular: Negative.   Gastrointestinal: Negative.   Genitourinary: Positive for flank pain and urgency.  Musculoskeletal: Positive for falls.  Skin: Negative.   Neurological: Positive for speech change (hypophonia).  Psychiatric/Behavioral: Positive for hallucinations.     PHYSICAL EXAMINATION:    VITALS:   Vitals:   06/29/17 1030  BP: 112/64  Pulse: 70  SpO2: 93%  Weight: 261 lb (118.4 kg)  Height: _0  (1.753 m)   GEN:  The patient appears stated age and is in NAD. HEENT:  Normocephalic, atraumatic.  The mucous membranes are moist. The superficial temporal arteries are without ropiness or tenderness. CV:  RRR Lungs:  CTAB Neck/HEME:  There are no carotid bruits bilaterally.  Neurological examination:  Orientation: The patient is alert and oriented x3. Cranial nerves: There is good facial symmetry. The speech is fluent.  He is hypophonic.  He is dysarthric.  Soft palate rises symmetrically and there is no tongue deviation. Hearing is intact to conversational tone. Sensation: Sensation is intact to light touch throughout Motor: Strength is 5/5 in the bilateral upper and lower extremities.   Shoulder shrug is equal and symmetric.  There is no pronator drift.  Movement examination: Tone: There is mild increased tone in the left lower extremity. Abnormal movements: No tremor is noted today. Coordination:  There is slowness with virtually all forms of rapid alternating movements and mild decremation, more on the left than the right.   Gait and Station: The patient has difficulty arising out of a deep-seated chair without the use of the hands.  He pushes off of the chair.  He is given a walker.  He shuffles even with a walker.  He does better with a 2 wheeled walker than a Rollator.  He has festination of gait.   LABS  Lab Results  Component Value Date   WBC 7.8 04/04/2017   HGB 12.5 (L) 04/04/2017   HCT 36.3  (L) 04/04/2017   MCV 82.5 04/04/2017   PLT 151.0 04/04/2017   Lab Results  Component Value Date   TSH 4.57 (H) 05/17/2017     Chemistry      Component Value Date/Time   NA 134 (L) 05/17/2017 1439   NA 142 12/22/2016 1233   K 3.5 05/17/2017 1439   CL 93 (L) 05/17/2017 1439   CO2 32 05/17/2017 1439   BUN 54 (H) 05/17/2017 1439   BUN 33 (H) 12/22/2016 1233   CREATININE 2.26 (H) 05/17/2017 1439   CREATININE 1.53 (H) 05/22/2016 1106      Component Value Date/Time   CALCIUM 8.9 05/17/2017 1439   ALKPHOS 80 05/17/2017 1439  AST 14 05/17/2017 1439   ALT 25 05/17/2017 1439   BILITOT 0.3 05/17/2017 1439     Lab Results  Component Value Date   MPNTIRWE31 540 (H) 02/22/2017   Lab Results  Component Value Date   HGBA1C 9.6 05/17/2017   DBS programming was performed today, which is described in more detail on a separate programming procedural notes.   ASSESSMENT/PLAN:  1.  Idiopathic Parkinson's disease.    -He is now down to once per day Artane.  I would like to get him off of this medication altogether.  He was agreeable to hold for the weekend and we will call him on Monday.  -continue Mirapex ER 1.5 mg daily.  decrease pramipexole ER and change to IR formulation.  Take 0.25 mg - 2 in the AM, 2 in the afternoon, 1 in the evening for a week and titrate down to 0.25 mg tid  -start carbidopa/levodopa 25/100 tid.  Suspect he will need a higher dosage.  We will reevaluate him in a few weeks.  -Have tried several times to get him off of Artane and he goes back on it.  It is a very small dose, but he thinks it helps balance but with its anticholinergic properties, that does not make a lot of sense.  Regardless, I did not change it today since he is only on it once per day.  He does tell me that he is willing to retry changing this in the future.  I did not change it today because I am changing his pramipexole  -His DBS battery was last changed on 09/05/2012 and on 06/10/15.  Adjusted  device today and did show some improvement following programming.  -has appt with derm next week.   2.  RBD  -Was initially unable to tolerate clonazepam but wife restarted it and he is doing better on that.  If this does not help, can try Rozerem if insurance will approve. 3.  Pseudobulbar affect (crying)  -Talked to him about the nature and pathophysiology.  Really does not want any medication for this.   4.  OSAS, noncompliant with CPAP.  -Unfortunately, he has been unable to tolerate CPAP.  Talked about dental appliances, which he does not wish to try either. 5.  CHF  -ICD placed on 11/15/2013 6.  Follow up is anticipated in the next few months, sooner should new neurologic issues arise.  Much greater than 50% of this visit was spent in counseling and coordinating care.  Total face to face time:  25 min.  This didn't include DBS programming time.

## 2017-06-29 ENCOUNTER — Encounter: Payer: Self-pay | Admitting: Family Medicine

## 2017-06-29 ENCOUNTER — Encounter: Payer: Self-pay | Admitting: Neurology

## 2017-06-29 ENCOUNTER — Other Ambulatory Visit: Payer: Self-pay | Admitting: Family Medicine

## 2017-06-29 ENCOUNTER — Ambulatory Visit (INDEPENDENT_AMBULATORY_CARE_PROVIDER_SITE_OTHER): Payer: Medicare Other | Admitting: Neurology

## 2017-06-29 VITALS — BP 112/64 | HR 70 | Ht 69.0 in | Wt 261.0 lb

## 2017-06-29 DIAGNOSIS — G4752 REM sleep behavior disorder: Secondary | ICD-10-CM

## 2017-06-29 DIAGNOSIS — I255 Ischemic cardiomyopathy: Secondary | ICD-10-CM | POA: Diagnosis not present

## 2017-06-29 DIAGNOSIS — Z9689 Presence of other specified functional implants: Secondary | ICD-10-CM

## 2017-06-29 DIAGNOSIS — G20 Parkinson's disease: Secondary | ICD-10-CM

## 2017-06-29 DIAGNOSIS — R441 Visual hallucinations: Secondary | ICD-10-CM | POA: Diagnosis not present

## 2017-06-29 MED ORDER — FLUTICASONE-SALMETEROL 250-50 MCG/DOSE IN AEPB
1.0000 | INHALATION_SPRAY | Freq: Two times a day (BID) | RESPIRATORY_TRACT | 5 refills | Status: DC
Start: 2017-06-29 — End: 2017-11-25

## 2017-06-29 MED ORDER — PRAMIPEXOLE DIHYDROCHLORIDE 0.25 MG PO TABS: 0.2500 mg | ORAL_TABLET | Freq: Three times a day (TID) | ORAL | 1 refills | Status: DC

## 2017-06-29 MED ORDER — CLONAZEPAM 0.5 MG PO TABS: 0.5000 mg | ORAL_TABLET | Freq: Every day | ORAL | 4 refills | Status: DC

## 2017-06-29 MED ORDER — CARBIDOPA-LEVODOPA 25-100 MG PO TABS: 1.0000 | ORAL_TABLET | Freq: Three times a day (TID) | ORAL | 2 refills | Status: DC

## 2017-06-29 NOTE — Procedures (Signed)
DBS Programming was performed.    Total time spent programming was 18 minutes.  Device was confirmed to be on.  Soft start was confirmed to be on.  Impedences were checked and were within normal limits.  Battery was checked and was determined to be functionally normally and not near end of life (2.74).    Detailed analysis on separate neurophysiologic worksheet.    Final settings were as follows:  Left brain electrode:     1-2+           ; Amplitude  4.3   V   ; Pulse width 90 microseconds;   Frequency   160   Hz.  Right brain electrode:     4-7+          ; Amplitude   4.5  V ;  Pulse width 90  microseconds;  Frequency   160    Hz. (had hand pull on the left with V of 4.6)

## 2017-06-29 NOTE — Patient Instructions (Addendum)
1. Stop Pramipexole 1.5 mg ER.  Start Pramipexole 0.25 mg as follows: 2 tablets in the morning, 2 tablets in the afternoon, 1 tablet in the evening for one week Then 2 tablets in the morning, 1 tablet in the afternoon, 1 tablet in the evening for one week Then 1 tablet three times daily  2. At the same time Start Carbidopa Levodopa as follows:  Take 1/2 tablet three times daily, at least 30 minutes before meals, for one week  Then take 1/2 tablet in the morning, 1/2 tablet in the afternoon, 1 tablet in the evening, at least 30 minutes before meals, for one week  Then take 1/2 tablet in the morning, 1 tablet in the afternoon, 1 tablet in the evening, at least 30 minutes before meals, for one week  Then take 1 tablet three times daily, at least 30 minutes before meals  3. Follow up 3 weeks.

## 2017-07-08 ENCOUNTER — Telehealth: Payer: Self-pay | Admitting: Family Medicine

## 2017-07-08 ENCOUNTER — Encounter: Payer: Self-pay | Admitting: Family Medicine

## 2017-07-08 ENCOUNTER — Other Ambulatory Visit: Payer: Self-pay | Admitting: Endocrinology

## 2017-07-08 DIAGNOSIS — D696 Thrombocytopenia, unspecified: Secondary | ICD-10-CM | POA: Insufficient documentation

## 2017-07-08 NOTE — Telephone Encounter (Signed)
Call patient.  Notes from renal clinic reviewed.  Mild thrombocytopenia noted.  Unremarkable, especially considering he has borderline low platelet levels over the last few years.  Nothing needs to be done about this unless he is having significant bleeding.  I will defer about his hyperglycemia to the endocrine clinic.  Thanks.

## 2017-07-09 NOTE — Telephone Encounter (Signed)
Patent notified as instructed by telephone and verbalized understanding. Patient stated that he is not having any significant bleeding and will let Dr. Damita Dunnings know if that changes.

## 2017-07-17 ENCOUNTER — Ambulatory Visit (INDEPENDENT_AMBULATORY_CARE_PROVIDER_SITE_OTHER): Payer: Medicare Other | Admitting: Endocrinology

## 2017-07-17 ENCOUNTER — Encounter: Payer: Self-pay | Admitting: Endocrinology

## 2017-07-17 VITALS — BP 112/64 | HR 68 | Ht 69.0 in | Wt 260.4 lb

## 2017-07-17 DIAGNOSIS — E1165 Type 2 diabetes mellitus with hyperglycemia: Secondary | ICD-10-CM | POA: Diagnosis not present

## 2017-07-17 DIAGNOSIS — N183 Chronic kidney disease, stage 3 unspecified: Secondary | ICD-10-CM

## 2017-07-17 DIAGNOSIS — I255 Ischemic cardiomyopathy: Secondary | ICD-10-CM | POA: Diagnosis not present

## 2017-07-17 DIAGNOSIS — Z794 Long term (current) use of insulin: Secondary | ICD-10-CM

## 2017-07-17 NOTE — Patient Instructions (Addendum)
Click 7 clicks at supper before the meal  Lantus 36 units daily  Allopurinol 1 tab daily

## 2017-07-17 NOTE — Progress Notes (Signed)
Patient ID: Henry [name], male   DOB: 1950-06-07, 67 y.o.   MRN: [id number]           Reason for Appointment: Follow-up for Type 2 Diabetes   History of Present Illness:          Diagnosis: Type 2 diabetes mellitus, date of diagnosis: 2000      Past history:   Patient thinks he has been taking Amaryl for several years and probably metformin since onset also At some point he was changed from metformin to [facility] and was taking this since at least 2012 A1c had been higher in 2015 Since his A1c had been progressively higher with his regimen of Janumet and Amaryl he was started on Victoza in 01/2014 He was started on Lantus insulin in 5/16 because of persistent hyperglycemia especially fasting; was having readings as high as 293 Because of tendency to high postprandial readings and for control with Lantus he was switched to the V-go pump in  09/2014  Recent history:   INSULIN regimen: V-go-40 pump and boluses 4-8 units.  Lantus 30 units at bedtime  Non-insulin hypoglycemic drugs the patient is taking are: Metformin 1 g a day, Victoza 1.8 mg daily in am   His A1c was last 8.3 and is now 9.6  Current management, blood sugar patterns and problems identified:  He has increased his Lantus up to 30 units as directed, however he did not go up any further despite his morning sugars being mostly high  His fasting readings are somewhat better compared to his last visit but still averaging over 200, previously 269  Only rarely will have a blood sugar like 132 or 162 in the morning  He was told to increase his Lantus progressively until morning sugars are down at least under 150 but he has not done so  Usually taking 4-5 clicks at suppertime for his meals but he still thinks that he has some insulin left over in the morning when he changes his pump and he will take the remaining insulin at that time  He is not motivated to improve his diet and still likes fried food, sweets and fruits like  watermelon  His wife thinks that he still eats food during the night although a little better than before  He is compliant with the dose of 1.8 mg on the Victoza in the mornings  He has seen the dietitian in 3/16  Not able to exercise because of back pain and musculoskeletal issues and balance       Side effects from medications have been: None Compliance with the medical regimen: Fair  Hypoglycemia:   none  Glucose monitoring:  done 1-2 times  a day         Glucometer: One Touch ultra 2 .      Blood Glucose readings by   Mean values apply above for all meters except median for One Touch  PRE-MEAL Fasting Lunch Dinner Bedtime Overall  Glucose range:  132-338  198-289     Mean/median:  202     219+/-55   POST-MEAL PC Breakfast PC Lunch PC Dinner  Glucose range:   345  201-365  Mean/median:    229    PREVIOUS readings:  PRE-MEAL Fasting Lunch Dinner  overnight Overall  Glucose range:  212-103   192-388    Mean/median:  269    299 269   POST-MEAL PC Breakfast PC Lunch PC Dinner  Glucose range:    131-183  Mean/median:  Self-care: The diet that the patient has been following is: None, some fried food and cookies, mostly sugar-free         Meals: 9 am , 2-3 pm and 6-7 pm     Exercise: unable to do any          Dietician visit, most recent: 4/19               Weight history:   Wt Readings from Last 3 Encounters:  07/17/17 260 lb 6.4 oz (118.1 kg)  06/29/17 261 lb (118.4 kg)  06/05/17 265 lb 12 oz (120.5 kg)    Glycemic control:   Lab Results  Component Value Date   HGBA1C 9.6 05/17/2017   HGBA1C 8.3 (H) 02/05/2017   HGBA1C 7.9 (H) 10/16/2016   Lab Results  Component Value Date   MICROALBUR <0.7 02/05/2017   LDLCALC 118 (H) 09/16/2015   CREATININE 2.26 (H) 05/17/2017    Lab Results  Component Value Date   FRUCTOSAMINE 350 (H) 03/29/2017   FRUCTOSAMINE 250 09/16/2015   FRUCTOSAMINE 259 12/15/2014     Other active problems: See review of  systems      Allergies as of 07/17/2017      Reactions   Penicillins Rash, Other (See Comments)   WEARS ALLERGY BRACELET Because of a history of documented adverse serious drug reaction;Medi Alert bracelet  is recommended Has patient had a PCN reaction causing immediate rash, facial/tongue/throat swelling, SOB or lightheadedness with hypotension: Yes Has patient had a PCN reaction causing severe rash involving mucus membranes or skin necrosis: unknown Has patient had a PCN reaction that required hospitalization NO Has patient had a PCN reaction occurring within the last 10 years: NO If all of    Peanut-containing Drug Products Other (See Comments)   cough   Watermelon [citrullus Vulgaris]    Tickle in throat and cough      Medication List        Accurate as of 07/17/17  1:40 PM. Always use your most recent med list.          acetaminophen 325 MG tablet Commonly known as:  TYLENOL Take 1-2 tablets (325-650 mg total) by mouth every 4 (four) hours as needed for mild pain.   albuterol 108 (90 Base) MCG/ACT inhaler Commonly known as:  PROVENTIL HFA;VENTOLIN HFA Inhale 2 puffs into the lungs every 6 (six) hours as needed for wheezing or shortness of breath.   allopurinol 100 MG tablet Commonly known as:  ZYLOPRIM Take 0.5 tablets (50 mg total) by mouth daily.   aspirin EC 81 MG tablet Take 81 mg by mouth daily.   carbidopa-levodopa 25-100 MG tablet Commonly known as:  SINEMET IR Take 1 tablet by mouth 3 (three) times daily.   clonazePAM 0.5 MG tablet Commonly known as:  KLONOPIN Take 1 tablet (0.5 mg total) by mouth at bedtime.   colchicine 0.6 MG tablet Take 1 tab twice a day if needed for gout.   Fluticasone-Salmeterol 250-50 MCG/DOSE Aepb Commonly known as:  ADVAIR Inhale 1 puff into the lungs 2 (two) times daily.   furosemide 80 MG tablet Commonly known as:  LASIX TAKE 1 & 1/2 TABLETS DAILY   gabapentin 100 MG capsule Commonly known as:  NEURONTIN TAKE 1 TO  2 CAPSULES (100-200 MG TOTAL) BY MOUTH 3 (THREE) TIMES DAILY AS NEEDED.   Insulin Glargine 100 UNIT/ML Solostar Pen Commonly known as:  LANTUS SOLOSTAR Inject 20 Units into the skin daily at 10 pm.  insulin lispro 100 UNIT/ML injection Commonly known as:  HUMALOG USE MAX 56 UNITS PER DAY   WITH V-GO PUMP   Insulin Pen Needle 32G X 5 MM Misc Commonly known as:  NOVOTWIST USE AS DIRECTED EVERY DAY   Insulin Pen Needle 32G X 4 MM Misc Used to give daily insulin injections.   KLOR-CON M20 20 MEQ tablet Generic drug:  potassium chloride SA TAKE 2 TABLETS (40 MEQ TOTAL) BY MOUTH DAILY.   levothyroxine 125 MCG tablet Commonly known as:  SYNTHROID, LEVOTHROID TAKE 1 TABLET EVERY DAY DAILY BEFORE BREAKFAST   lisinopril 2.5 MG tablet Commonly known as:  PRINIVIL,ZESTRIL TAKE 1 TABLET BY MOUTH DAILY.   metolazone 2.5 MG tablet Commonly known as:  ZAROXOLYN TAKE 1 TABLET BY MOUTH EVERY DAY AS NEEDED FOR >3 LB IN A DAY OR >5 LB IN A WEEK   metoprolol succinate 50 MG 24 hr tablet Commonly known as:  TOPROL-XL TAKE 1 TABLET BY MOUTH DAILY. TAKE WITH OR IMMEDIATELY FOLLOWING A MEAL.   multivitamin with minerals Tabs tablet Take 1 tablet by mouth daily.   MYRBETRIQ 50 MG Tb24 tablet Generic drug:  mirabegron ER Take 50 mg by mouth daily.   nitroGLYCERIN 0.4 MG SL tablet Commonly known as:  NITROSTAT Place 1 tablet (0.4 mg total) under the tongue every 5 (five) minutes as needed for chest pain.   ONETOUCH DELICA LANCETS 25K Misc Use to check blood sugar 2 times per day dx code E11.49   ONETOUCH VERIO test strip Generic drug:  glucose blood USE AS DIRECTED TO CHECK BLOOD SUGAR TWICE A DAY   ONETOUCH VERIO test strip Generic drug:  glucose blood USE AS DIRECTED TO CHECK BLOOD SUGAR TWICE A DAY   pramipexole 0.25 MG tablet Commonly known as:  MIRAPEX Take 1 tablet (0.25 mg total) by mouth 3 (three) times daily.   rosuvastatin 20 MG tablet Commonly known as:  CRESTOR Take  1 tablet (20 mg total) daily by mouth.   traMADol 50 MG tablet Commonly known as:  ULTRAM TAKE 1 TABLET BY MOUTH TWICE A DAY AS NEEDED   travoprost (benzalkonium) 0.004 % ophthalmic solution Commonly known as:  TRAVATAN Place 1 drop into the right eye at bedtime.   trihexyphenidyl 2 MG tablet Commonly known as:  ARTANE Take 1 tablet (2 mg total) by mouth daily.   V-GO 40 Kit Use one pod daily with insulin   VICTOZA 18 MG/3ML Sopn Generic drug:  liraglutide INJECT 1.'8MG'$  ONCE DAILY   vitamin B-12 1000 MCG tablet Commonly known as:  CYANOCOBALAMIN Take 1,000 mcg by mouth daily.       Allergies:  Allergies  Allergen Reactions  . Penicillins Rash and Other (See Comments)    WEARS ALLERGY BRACELET Because of a history of documented adverse serious drug reaction;Medi Alert bracelet  is recommended Has patient had a PCN reaction causing immediate rash, facial/tongue/throat swelling, SOB or lightheadedness with hypotension: Yes Has patient had a PCN reaction causing severe rash involving mucus membranes or skin necrosis: unknown Has patient had a PCN reaction that required hospitalization NO Has patient had a PCN reaction occurring within the last 10 years: NO If all of   . Peanut-Containing Drug Products Other (See Comments)    cough  . Watermelon [Citrullus Vulgaris]     Tickle in throat and cough    Past Medical History:  Diagnosis Date  . AICD (automatic cardioverter/defibrillator) present    Dr Lovena Le office visit yearly, MDT   .  Arthritis    cane  . Asthma   . Benign neoplasm of colon   . CAD (coronary artery disease)   . Cardiomyopathy   . Cataract   . Complication of anesthesia    pt states that he got a rash  . Constipation   . 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 DM (diabetes mellitus) ([place])    TYPE 2   . GERD (gastroesophageal reflux disease)   . Glaucoma    right eye  . HLD (hyperlipidemia)   . HTN (hypertension)    pt  denies 08/19/12  . Hyperplasia, prostate   . Hyperthyroidism    thyroid lobectomy  . MI (myocardial infarction) [facility])    Dr Stanford Breed 2000, x3vessels bypass  . Nephrolithiasis   . OSA (obstructive sleep apnea)    AHI-28,on CPAP, noncompliant with CPAP  . Parkinson disease ([place])    1999  . PONV (postoperative nausea and vomiting)   . Restless legs   . Shortness of breath    Hx: of at all times  . Sleep apnea    does not use CPAP  . Sleep apnea, organic   . UTI (lower urinary tract infection) 09/15/12   Klebsiella  . Ventral hernia     Past Surgical History:  Procedure Laterality Date  . 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 CARDIAC CATHETERIZATION    . CATARACT EXTRACTION W/ INTRAOCULAR LENS IMPLANT     Hx: of right eye  . CATARACT EXTRACTION W/ INTRAOCULAR LENS IMPLANT Left 2018  . COLONOSCOPY N/A 10/13/2014   Procedure: COLONOSCOPY;  Surgeon: Jerene Bears, MD;  Location: [facility] ENDOSCOPY;  Service: Gastroenterology;  Laterality: N/A;  . COLONOSCOPY W/ BIOPSIES AND POLYPECTOMY     Hx: of  . CORONARY ARTERY BYPASS GRAFT  2000   [name], MD  . [place]  . DEEP BRAIN STIMULATOR PLACEMENT  2004   Right and left VIN stimulator placement (parkinsons)  . EYE SURGERY    . FINGER AMPUTATION     left pointer  . IMPLANTABLE CARDIOVERTER DEFIBRILLATOR IMPLANT N/A 11/13/2013   Procedure: IMPLANTABLE CARDIOVERTER DEFIBRILLATOR IMPLANT;  Surgeon: Evans Lance, MD;  Location: [facility] C[facility] LAB;  Service: Cardiovascular;  Laterality: N/A;  . INSERT / REPLACE / REMOVE PACEMAKER    . LEFT AND RIGHT HEART CATHETERIZATION WITH CORONARY ANGIOGRAM N/A 09/24/2013   Procedure: LEFT AND RIGHT HEART CATHETERIZATION WITH CORONARY ANGIOGRAM;  Surgeon: Burnell Blanks, MD;  Location: [facility] CATH LAB;  Service: Cardiovascular;  Laterality: N/A;  . LITHOTRIPSY     3 different times  . MEDIAN STERNOTOMY  2000  . SUBTHALAMIC STIMULATOR BATTERY REPLACEMENT N/A  09/05/2012   Procedure: Deep brain stimulator battery change;  Surgeon: Erline Levine, MD;  Location: [place] NEURO ORS;  Service: Neurosurgery;  Laterality: N/A;  Deep brain stimulator battery change  . SUBTHALAMIC STIMULATOR BATTERY REPLACEMENT N/A 06/10/2015   Procedure: Deep Brain stimulator battery change;  Surgeon: Erline Levine, MD;  Location: [place] NEURO ORS;  Service: Neurosurgery;  Laterality: N/A;  . TONSILLECTOMY      Family History  Problem Relation Age of Onset  . Aneurysm Mother   . Alcoholism Father   . HIV/AIDS Brother 68       AIDS  . Healthy Child   . Peripheral vascular disease Unknown   . Arthritis Unknown   . Healthy Child   . Healthy Child   .  Diabetes Neg Hx   . Heart disease Neg Hx   . Colon cancer Neg Hx   . Prostate cancer Neg Hx     Social History:  reports that he has never smoked. He has never used smokeless tobacco. He reports that he drinks alcohol. He reports that he does not use drugs.    Review of Systems   Gout of Left MTP joint on 05/03/17: better but no uric acid level recently   Most recent eye exam was done in 04/2017    HYPERLIPIDEMIA:  He is on Crestor with good control of LDL, has low HDL Triglycerides are persistently high, he is on Lovaza         Lab Results  Component Value Date   CHOL 123 04/04/2017   CHOL 111 10/16/2016   CHOL 132 12/06/2015   Lab Results  Component Value Date   HDL 32.30 (L) 04/04/2017   HDL 27.80 (L) 10/16/2016   HDL 30.20 (L) 12/06/2015   Lab Results  Component Value Date   LDLCALC 118 (H) 09/16/2015   LDLCALC 126 (H) 04/08/2013   LDLCALC 51 02/28/2012   Lab Results  Component Value Date   TRIG 232.0 (H) 04/04/2017   TRIG 287.0 (H) 10/16/2016   TRIG 217.0 (H) 12/06/2015   Lab Results  Component Value Date   CHOLHDL 4 04/04/2017   CHOLHDL 4 10/16/2016   CHOLHDL 4 12/06/2015   Lab Results  Component Value Date   LDLDIRECT 61.0 04/04/2017   LDLDIRECT 52.0 10/16/2016   LDLDIRECT 69.0  12/06/2015                  Thyroid:   He has had hypothyroidism following radioactive iodine treatment for hyperthyroidism in 2013  TSH is usually normal with  dosage of 125 g   Lab Results  Component Value Date   TSH 4.57 (H) 05/17/2017   TSH 3.62 02/05/2017   TSH 2.35 03/29/2016   FREET4 0.74 02/05/2017   FREET4 0.82 08/20/2014   FREET4 0.98 11/20/2011       HYPERTENSION has been followed by  other physicians  Also on diuretics from cardiologist, currently on 80 mg Lasix  CKD: Has not seen nephrologist yet Renal dysfunction patterns as follows:   Lab Results  Component Value Date   CREATININE 2.26 (H) 05/17/2017   CREATININE 2.02 (H) 04/04/2017   CREATININE 2.00 (H) 03/29/2017     Diabetic foot exam in 03/2016 showed  normal monofilament sensation in the toes except decreased on the right great toe and plantar surfaces, no skin lesions or ulcers on the feet and normal pedal pulses    Physical Examination:  BP 112/64 (BP Location: Left Arm, Patient Position: Sitting, Cuff Size: Normal)   Pulse 68   Ht '5\' 9"'$  (1.753 m)   Wt 260 lb 6.4 oz (118.1 kg)   SpO2 93%   BMI 38.45 kg/m      ASSESSMENT/PLAN  Diabetes type 2, uncontrolled with obesity     See history of present illness for detailed discussion of his current management, blood sugar patterns and problems identified  His A1c last was 9.6  As discussed above he appears to have progressively increasing insulin requirement Even though his blood sugars are somewhat better than the last visit with increasing his basal insulin he still is getting average of over 200 fasting This is partly related to eating during the night and generally poor compliance with diet which he admits he cannot do better with  He thinks he can start doing a little better with cutting back on carbohydrates and sweets at least He does however need to check more blood sugars after meals which he is not doing currently Likely needs  better coverage for his evening meal also with a bolus  He may possibly do better with the weight loss medication but these are not covered by his insurance and he is already on multiple medications of various kinds More recently may be sleeping a little better with starting Klonopin from the neurologist, this limits his eating at night  Recommendations:    Increase Lantus by 6 units  No change in insulin pump  Continue Victoza 1.8, currently is Kirke Shaggy is not covered by his insurance  He will go up to 14 units bolus coverage for his evening meal  Needs to cut back on portions at least of fried food, ice cream, fruits with sugar and snacking during the night; his wife will try to put a lock on the refrigerator at night  He needs to check more readings after meals especially in the evening  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    Counseling time on subjects discussed in assessment and plan sections is over 50% of today's 25 minute visit   Elayne Snare 07/17/2017, 1:40 PM   Note: This office note was prepared with Dragon voice recognition system technology. Any transcriptional errors that result from this process are unintentional.

## 2017-07-19 NOTE — Progress Notes (Signed)
Henry [name] was seen today in the movement disorders clinic for neurologic consultation at the request of [name].  His PCP is Tonia Ghent, MD.  The consultation is for the evaluation of PD and to manage his DBS.  He is accompanied by his wife who supplements the history.  The first symptom(s) the patient noticed was right hand tremor in 1999.  He was seen by neurology and was dx with PD.  He was placed on something, but it caused sleepiness.  He does not think that he has ever been on levodopa.   He was placed on Mirapex, which seemed to help.  He only takes it twice per day.  He didn't think it made a difference when he took it tid.  He began to have tremor and it was suggested he do DBS.    The pt is s/p stn DBS in 2005.  He had a battery change in 2009.    10/02/12 update: The patient is accompanied today by his daughter, who supplements the history.  I reviewed medical records available to me since last visit.  The patient had his generator changed on 09/05/2012.  He remains on pramipexole, 0.5 mg twice per day.  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 [name] in that regard.  He is not compliant with CPAP so says that he doesn't want to return to her for f/u. He doesn't take the klonopin faithfully.  10/11/12 update:  Pt was seen as a walk in/work in today.  Pt had increasing tremor, L greater than R for 3 days.  Thinks that it started with the d/c of artane.  Speech stable.  11/19/12 update:  Pt is seen today for his PD, accompanied by his daughter who supplements the hx.    He is currently on klonopin 0.5 mg - 1/2-1 tablet q hs.  He only takes it when his wife is not working.  She works nights and is only in 2 days per week.  He has some reluctance to take it other nights.  He is on pramipexole 0.5 mg bid.  Last visit, his DBS was reset more similar to the settings he had prior to coming here, just with an increased voltage.  About 2 weeks after our last  visit, the patient decided to go back on the Artane.  The combination of the Artane and the DBS changes helped significantly.  He does ask if I can slightly increased voltage on the left hand, as he has some tremor at night that is bothersome.  Otherwise, he is doing well.  He feels that his balance has been great.  02/19/13 update:  This patient is accompanied in the office by his child who supplements the history.  The pt has a hx of PD.  He has been tremor free and is very happy about that.  He has had some increased balance loss; the last fall was a few months ago but he hasn't gotten hurt.  Was considering hernia surgery.  The records that were made available to me were reviewed.  He is holding on that for now but plans to have it done before end of year.  No hallucinations.  He is still having some acting out of the dreams, despite clonazepam.  06/19/13 update:  Patient is returning to followup regarding his Parkinson's disease.  I had the opportunity to review records since last visit.  He went to the emergency room on  05/12/2013 with chest pain and shortness of breath.  He had been fishing and had missed several doses of Lasix.  He had been eating seafood.  He ended up with an acute exacerbation of his chronic congestive heart failure.  Once he was diuresed and back on his medications, he is feeling better.  He just had a nuclear medicine study done on 06/17/2013 and the ejection fraction on this looked better than on his echocardiogram.  The left ventricular ejection fraction was 41%.  Prior to that, there was some concern that his left ventricular ejection fraction had dropped so much that he may need an ICD.  In terms of Parkinson's disease, the patient states that he has been doing very well in terms of tremor, but his walking really has deteriorated.  He describes a festinating gait, with much more shuffling.  He fell walking over his dog gate but otherwise has not had falls.  He went fishing  yesterday and states that he "stumbled all day long."  No hallucinations.  He has been trying to be more faithful with his CPAP. He remains on Mirapex 0.5 mg twice a day, Artane 2 mg twice a day.  10/23/13 update:  The patient returns today to the clinic, accompanied by his wife who supplements the history.  From a Parkinson's standpoint, the patient states that he has been doing very well.  No tremor.  He started on levodopa last visit and states that he has had no falls and overall stumbling has been much better.  He states that the only time that he stumbles is if he is inside of his fishing boat, and states that that is really because it is small and unstable.  He is taking his levodopa in the morning, after lunch and bedtime.  He has had no hallucinations.  His wife states that he is still draining and acting out the dreams and even fell out of bed a few days ago.  However, he is taking his clonazepam about 10 PM but doesn't go to bed until 1 AM.  He is not using his CPAP.  Is having significant constipation.   I reviewed his cardiology records since last visit.  He did have a heart catheterization and is scheduled to have an ICD placed on November 5.  02/23/14 update:  Pt returns for follow up accompanied by his wife who supplements the history.  The records that were made available to me were reviewed since last visit.  He had an ICD placed on 11/15/13.  He is doing well.  Taking carbidopa/levodopa 25/100 three times a day and remains on artane as well as mirapex 0.5 mg bid.  Had a fall up the stairs.  Wife describes festinating gate and pt thinks that levodopa contributes.  He also doesn't feel good much of the time and thinks that is from levodopa.  Admits, however, that when BS under good control, he feels good.  Has started seeing endocrinology and started on new meds.  He has klonopin for RBD.  Still vivid dreams but no falling out bed (in recliner now).  Having some word finding trouble and memory  doesn't seem clear.  Also, put back tailgate down on car and came down on generator and wants me to look at that.    07/01/14 update:  The patient follows up today regarding his Parkinson's disease.  He is accompanied by his wife who supplements the history.  Last visit, the patient was complaining about memory change which  I thought was likely multifactorial and due to medication such as Artane, Mirapex and Ultram, which he was taking every 3 hours.  The patient, however, thought that it was from levodopa and so we held it and the patient reported that he felt better and therefore continued to stay off of the medication.  He reports that falls are better after d/c levodopa but wife reports still falls.  Pt states that he only fell in shower after he d/c levodopa.  His wife does admit that she thinks that levodopa was causing loss of balance.  He is having more tremor over the last month on the L hand.   He remains on clonazepam 0.5 mg, 1-1/2 tablets at night for REM behavior disorder.  I reviewed records since last visit.  He has seen Dr. Dwyane Dee multiple times in regards to his uncontrolled diabetes.  He started on insulin on May 22 2014.  He states that he is doing well with injecting himself.  Having a lot of constipation.  Has the rancho recipe.    11/03/14 update:  The patient is following up today, accompanied by his wife who supplements the history.  Records were reviewed since our last visit.  The patient is on pramipexole 0.5 mg twice a day and Artane 2 mg twice a day.  He has not had any hallucinations.  Had a fall the other day walking up stairs carrying something and fell backward.  Hit his back.  No LOC.  Also fell backward in the summer around the pool.  [place] one time out of his boat.  Doesn't want to use a walker. He was in the emergency room recently with a rash that was felt secondary to insect bites that he got in the woods.  He also recently had a colonoscopy.  His diabetes is under better  control and his last A1c was 7.2.  States that he now has an insulin pump.  He remains on clonazepam 0.5 mg, 1-1/2 tablets at night for REM behavior disorder.  More back pain lately and will start injections for the back which have helped previously  02/04/15 update:  The patient presents today, accompanied by his wife who supplements the history.  I have reviewed prior records made available to me.  He has established a new primary care doctor in Dr. Damita Dunnings.  The patient remains on Artane, 2 mg twice a day as well as pramipexole 0.5 mg twice a day.  Overall, he has been doing fairly well.  He occasionally still has some tremor.  He had 2 falls since our last visit; he states that he was pushing a gate open and it came back and hit him.  He was also carrying wood the other day and fell forward with wood.  No hallucinations.  No lightheadedness or near syncope.  He remains on clonazepam 0.5 mg, 1-1/2 tablets at night for REM behavior disorder.  [facility] at [facility].    06/08/15 update:  This patient is accompanied in the office by his spouse who supplements the history.  The patient remains on Artane, 2 mg twice a day as well as pramipexole 0.5 mg twice a day.  He tripped over the curb at [employer].  He tripped over the step on his deck.   He remains on clonazepam 0.5 mg, 1-1/2 tablets at night for REM behavior disorder.  He is still having trouble with that.  He fell out of bed the other night.  Wife asks about bed rails.   He felt that [facility] helped.  Is looking forward to getting in his pool.    09/09/15 update:  The patient follows up today, accompanied by his wife who supplements the history.  He is on pramipexole 0.5 g twice a day and trihexyphenidyl 2 mg twice a day.  Didn't go up to the 1.5 mg of pramipexole.   He is on clonazepam 0.5 mg, 1-1/2 tablets at night for REM behavior disorder.  His biggest issue is RLS at bedtime.  It is preventing him from going to sleep.    He denies any hallucinations.  He denies cognitive change.  He continues to struggle with balance and has fallen/stumbled a few times.  He has fallen in the boat.  Wife describes festinating gait.  Wife also describes crying frequently.  Denies depression. He has his battery changed on 06/10/15  12/18/15 update:  Pt is following up today, accompanied by his wife who supplements the history.  Last visit, I changed him from pramipexole, 0.5 mg twice per day to Mirapex ER, 1.5 mg once per day.  He states that it is very expensive.  He remains on trihexyphenidyl, 2 mg twice per day.  He is also on clonazepam 0.5 mg, 1-1/2 tablets at night for REM behavior disorder. It turns out he was taking this at 6pm.   I reviewed records since last visit.  He reports that his pharmacist to come off his Aldactone because of an interaction with his Parkinson's medicine.  I am unaware of any interaction with his current medications.  Been having issues with constipation, which has been treated by Dr. Damita Dunnings.  [facility] is in [facility].  When asked about falls, his wife states that he falls all the time but he is more conscious of festinating gait now that he is in PT.  Not gotten hurt with falls.    07/13/16 update:  Pt f/u today for PD, accompanied by his wife, who supplements the history.  The records that were made available to me were reviewed since last visit.  On pramipexole ER 1.5 mg daily and artane 2 mg bid.  Was on klonopin for RBD but he d/c the medication.  He thought that it caused insomnia.  He is still acting out the dreams.  He won't use his CPAP machine.  States that he is afraid of it malfunctioning.  Often will sleep in the recliner.  Having more back pain.  Having injections and 3rd is next week at [facility].    Wearing off:  No.  How long before next dose:  n/a Falls:   Yes.  , 12 months but some of these were falls out of bed in RBD N/V:  No. Hallucinations:  Yes.   (none visual but few auditory and has  gotten out of bed to see who was there)  visual distortions: No. Lightheaded:  No.  Syncope: No. Dyskinesia:  No.   12/15/16 update: Patient seen today in follow-up for his Parkinson's disease.  He is accompanied by his wife who supplements the history.  He remains on pramipexole ER, 1.5 mg daily.  He is also on trihexyphenidyl, 2 mg.  He was taking bid but he looked at his bottle and realized he was supposed to be taking it qd and just started doing that.  He has not had any hallucinations, except a few arising in the middle of the night out of a vivid dream.  No compulsive  behaviors.  I have reviewed records made available to me from his other providers, including his endocrinologist, cardiologist and primary care physician.  Off of klonopin.  "If I take it I sleep the entire next day."  He is having vivid dreams.  States that he has not fallen that much until last night.  Wife describes festinating gait.  Having more trouble in the shower.  A shower chair will not fit in his shower.  They have a small corner seat, but he states that he cannot wash his feet if he sits in it.  It is too slippery.  He is a currently attending [facility] through [facility] for his low back pain.  He does like that.  04/20/17 update: Patient is seen today in follow-up for his Parkinson's disease.  Patient is accompanied by his wife who supplements the history.  Patient is on pramipexole, 1.5 mg daily.  He is also on Artane, 2 mg daily.  No hallucinations.  No lightheadedness or near syncope.  I have talked to his primary care physician about him.  Primary care wanted to get an MRI of his brain due to hearing loss. 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  Discussed with his primary care physician that if he needed MRI, his device is MRI compatible for head MRI, but would need [employer] rep there to turn off the device and make sure  impedances were normal the day of MRI. Has new hearing aid as of today and doing well with that.   [place] today and tried to get off of toilet and hit door knob and fell.  Still going fishing and friend helps him navigate the water.  Went yesterday. Having more renal dysfunction and urinary urgency and dysuria.  No UTI per urology and placed on myrbetriq.  Has referral to nephrology but has not heard from them yet.  06/29/17 update: Patient is seen today in follow-up for Parkinson's.  We tried to discontinue his trihexyphenidyl, but they felt that his walking got worse after discontinuing the medication.  It was restarted, but I told them to trial off of it again as trihexyphenidyl generally has a negative effect on walking and not positive.  He ended up staying on the medication.  Overall, they do think that balance has been worse as has been acting out of the dreams.  He has had 2-3 falls.  He is having freezing.  Battery site sore since fall.   He describes hallucinations but they arise out of the sleep.  He will start dreaming and it feels real.  He states that it goes away when wakes up.  Wife isn't so sure.  He is tried clonazepam but that it made him too sleepy.  wife states that she restarted klonopin a week ago, 0.5 mg, and wife thinks that it has helped sleep but still having a bit of that.  He is still on pramipexole ER, 1.5 mg daily.     07/23/17 update: Patient is seen today in follow-up for Parkinson's.  His device was adjusted last visit and I also had him start back on carbidopa/levodopa 25/100 and work to 1 tablet 3 times per day.  His pramipexole was decreased to 0.25 mg tid.  Today, he reports that he is feeling more stable with this change in meds.  He is back on klonopin and sleeping well.  He fell in the kitchen.  He was near the stove and went to reach for the refrigerator and fell and glasses cut his face.  He had his walker near him but wasn't using it because "I didn't need it."     He is  having trouble remembering middle of the day dosing but generally takes med at 8am/2pm/8pm (didn't take 2 pm today and seen at 3pm)  PREVIOUS MEDICATIONS: Mirapex and artane, levodopa (made "too loose"); clonazepam (overly sleepy)  ALLERGIES:   Allergies  Allergen Reactions  . Penicillins Rash and Other (See Comments)    WEARS ALLERGY BRACELET Because of a history of documented adverse serious drug reaction;Medi Alert bracelet  is recommended Has patient had a PCN reaction causing immediate rash, facial/tongue/throat swelling, SOB or lightheadedness with hypotension: Yes Has patient had a PCN reaction causing severe rash involving mucus membranes or skin necrosis: unknown Has patient had a PCN reaction that required hospitalization NO Has patient had a PCN reaction occurring within the last 10 years: NO If all of   . Peanut-Containing Drug Products Other (See Comments)    cough  . Watermelon [Citrullus Vulgaris]     Tickle in throat and cough    CURRENT MEDICATIONS:   Allergies as of 07/23/2017      Reactions   Penicillins Rash, Other (See Comments)   WEARS ALLERGY BRACELET Because of a history of documented adverse serious drug reaction;Medi Alert bracelet  is recommended Has patient had a PCN reaction causing immediate rash, facial/tongue/throat swelling, SOB or lightheadedness with hypotension: Yes Has patient had a PCN reaction causing severe rash involving mucus membranes or skin necrosis: unknown Has patient had a PCN reaction that required hospitalization NO Has patient had a PCN reaction occurring within the last 10 years: NO If all of    Peanut-containing Drug Products Other (See Comments)   cough   Watermelon [citrullus Vulgaris]    Tickle in throat and cough      Medication List        Accurate as of 07/23/17  3:21 PM. Always use your most recent med list.          acetaminophen 325 MG tablet Commonly known as:  TYLENOL Take 1-2 tablets (325-650 mg total) by  mouth every 4 (four) hours as needed for mild pain.   albuterol 108 (90 Base) MCG/ACT inhaler Commonly known as:  PROVENTIL HFA;VENTOLIN HFA Inhale 2 puffs into the lungs every 6 (six) hours as needed for wheezing or shortness of breath.   allopurinol 100 MG tablet Commonly known as:  ZYLOPRIM Take 0.5 tablets (50 mg total) by mouth daily.   aspirin EC 81 MG tablet Take 81 mg by mouth daily.   carbidopa-levodopa 25-100 MG tablet Commonly known as:  SINEMET IR Take 1 tablet by mouth 3 (three) times daily.   carbidopa-levodopa 25-100 MG tablet Commonly known as:  SINEMET IR 2 in the morning, 2 in the afternoon, 1 in the evening   clonazePAM 0.5 MG tablet Commonly known as:  KLONOPIN Take 1 tablet (0.5 mg total) by mouth at bedtime.   colchicine 0.6 MG tablet Take 1 tab twice a day if needed for gout.   Fluticasone-Salmeterol 250-50 MCG/DOSE Aepb Commonly known as:  ADVAIR Inhale 1 puff into the lungs 2 (two) times daily.   furosemide 80 MG tablet Commonly known as:  LASIX TAKE 1 & 1/2 TABLETS DAILY   gabapentin 100 MG capsule Commonly known as:  NEURONTIN TAKE 1 TO 2  CAPSULES (100-200 MG TOTAL) BY MOUTH 3 (THREE) TIMES DAILY AS NEEDED.   Insulin Glargine 100 UNIT/ML Solostar Pen Commonly known as:  LANTUS SOLOSTAR Inject 20 Units into the skin daily at 10 pm.   insulin lispro 100 UNIT/ML injection Commonly known as:  HUMALOG USE MAX 56 UNITS PER DAY   WITH V-GO PUMP   Insulin Pen Needle 32G X 5 MM Misc Commonly known as:  NOVOTWIST USE AS DIRECTED EVERY DAY   Insulin Pen Needle 32G X 4 MM Misc Used to give daily insulin injections.   KLOR-CON M20 20 MEQ tablet Generic drug:  potassium chloride SA TAKE 2 TABLETS (40 MEQ TOTAL) BY MOUTH DAILY.   levothyroxine 125 MCG tablet Commonly known as:  SYNTHROID, LEVOTHROID TAKE 1 TABLET EVERY DAY DAILY BEFORE BREAKFAST   lisinopril 2.5 MG tablet Commonly known as:  PRINIVIL,ZESTRIL TAKE 1 TABLET BY MOUTH DAILY.     metolazone 2.5 MG tablet Commonly known as:  ZAROXOLYN TAKE 1 TABLET BY MOUTH EVERY DAY AS NEEDED FOR >3 LB IN A DAY OR >5 LB IN A WEEK   metoprolol succinate 50 MG 24 hr tablet Commonly known as:  TOPROL-XL TAKE 1 TABLET BY MOUTH DAILY. TAKE WITH OR IMMEDIATELY FOLLOWING A MEAL.   multivitamin with minerals Tabs tablet Take 1 tablet by mouth daily.   MYRBETRIQ 50 MG Tb24 tablet Generic drug:  mirabegron ER Take 50 mg by mouth daily.   nitroGLYCERIN 0.4 MG SL tablet Commonly known as:  NITROSTAT Place 1 tablet (0.4 mg total) under the tongue every 5 (five) minutes as needed for chest pain.   ONETOUCH DELICA LANCETS 74J Misc Use to check blood sugar 2 times per day dx code E11.49   ONETOUCH VERIO test strip Generic drug:  glucose blood USE AS DIRECTED TO CHECK BLOOD SUGAR TWICE A DAY   pramipexole 0.25 MG tablet Commonly known as:  MIRAPEX Take 1 tablet (0.25 mg total) by mouth 3 (three) times daily.   rosuvastatin 20 MG tablet Commonly known as:  CRESTOR Take 1 tablet (20 mg total) daily by mouth.   traMADol 50 MG tablet Commonly known as:  ULTRAM TAKE 1 TABLET BY MOUTH TWICE A DAY AS NEEDED   Transport Chair Misc 1 Device by Does not apply route daily.   travoprost (benzalkonium) 0.004 % ophthalmic solution Commonly known as:  TRAVATAN Place 1 drop into the right eye at bedtime.   trihexyphenidyl 2 MG tablet Commonly known as:  ARTANE Take 1 tablet (2 mg total) by mouth daily.   V-GO 40 Kit Use one pod daily with insulin   VICTOZA 18 MG/3ML Sopn Generic drug:  liraglutide INJECT 1.8MG ONCE DAILY   vitamin B-12 1000 MCG tablet Commonly known as:  CYANOCOBALAMIN Take 1,000 mcg by mouth daily.        PAST MEDICAL HISTORY:   Past Medical History:  Diagnosis Date  . AICD (automatic cardioverter/defibrillator) present    Dr Lovena Le office visit yearly, MDT   . Arthritis    cane  . Asthma   . Benign neoplasm of colon   . CAD (coronary artery  disease)   . Cardiomyopathy   . Cataract   . Complication of anesthesia    pt states that he got a rash  . Constipation   . 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 DM (diabetes mellitus) ([place])    TYPE 2   . GERD (gastroesophageal reflux disease)   . Glaucoma  right eye  . HLD (hyperlipidemia)   . HTN (hypertension)    pt denies 08/19/12  . Hyperplasia, prostate   . Hyperthyroidism    thyroid lobectomy  . MI (myocardial infarction) [facility])    Dr Stanford Breed 2000, x3vessels bypass  . Nephrolithiasis   . OSA (obstructive sleep apnea)    AHI-28,on CPAP, noncompliant with CPAP  . Parkinson disease ([place])    1999  . PONV (postoperative nausea and vomiting)   . Restless legs   . Shortness of breath    Hx: of at all times  . Sleep apnea    does not use CPAP  . Sleep apnea, organic   . UTI (lower urinary tract infection) 09/15/12   Klebsiella  . Ventral hernia     PAST SURGICAL HISTORY:   Past Surgical History:  Procedure Laterality Date  . 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 CARDIAC CATHETERIZATION    . CATARACT EXTRACTION W/ INTRAOCULAR LENS IMPLANT     Hx: of right eye  . CATARACT EXTRACTION W/ INTRAOCULAR LENS IMPLANT Left 2018  . COLONOSCOPY N/A 10/13/2014   Procedure: COLONOSCOPY;  Surgeon: Jerene Bears, MD;  Location: [facility] ENDOSCOPY;  Service: Gastroenterology;  Laterality: N/A;  . COLONOSCOPY W/ BIOPSIES AND POLYPECTOMY     Hx: of  . CORONARY ARTERY BYPASS GRAFT  2000   [name], MD  . [place]  . DEEP BRAIN STIMULATOR PLACEMENT  2004   Right and left VIN stimulator placement (parkinsons)  . EYE SURGERY    . FINGER AMPUTATION     left pointer  . IMPLANTABLE CARDIOVERTER DEFIBRILLATOR IMPLANT N/A 11/13/2013   Procedure: IMPLANTABLE CARDIOVERTER DEFIBRILLATOR IMPLANT;  Surgeon: Evans Lance, MD;  Location: [facility] CATH LAB;  Service: Cardiovascular;  Laterality: N/A;  . INSERT / REPLACE / REMOVE PACEMAKER     . LEFT AND RIGHT HEART CATHETERIZATION WITH CORONARY ANGIOGRAM N/A 09/24/2013   Procedure: LEFT AND RIGHT HEART CATHETERIZATION WITH CORONARY ANGIOGRAM;  Surgeon: Burnell Blanks, MD;  Location: [facility] CATH LAB;  Service: Cardiovascular;  Laterality: N/A;  . LITHOTRIPSY     3 different times  . MEDIAN STERNOTOMY  2000  . SUBTHALAMIC STIMULATOR BATTERY REPLACEMENT N/A 09/05/2012   Procedure: Deep brain stimulator battery change;  Surgeon: Erline Levine, MD;  Location: [place] [facility];  Service: Neurosurgery;  Laterality: N/A;  Deep brain stimulator battery change  . SUBTHALAMIC STIMULATOR BATTERY REPLACEMENT N/A 06/10/2015   Procedure: Deep Brain stimulator battery change;  Surgeon: Erline Levine, MD;  Location: [place] NEURO ORS;  Service: Neurosurgery;  Laterality: N/A;  . TONSILLECTOMY      SOCIAL HISTORY:   Social History   Socioeconomic History  . Marital status: Married    Spouse name: [name]  . Number of children: 2  . Years of education: Not on file  . Highest education level: Not on file  Occupational History  . Occupation: DISABLED    Comment: [employer]  Social Needs  . Financial resource strain: Not on file  . Food insecurity:    Worry: Not on file    Inability: Not on file  . Transportation needs:    Medical: Not on file    Non-medical: Not on file  Tobacco Use  . Smoking status: Never Smoker  . Smokeless tobacco: Never Used  Substance and Sexual Activity  . Alcohol use: Yes    Comment: occasional beer  . Drug use: No  . Sexual  activity: Not on file  Lifestyle  . Physical activity:    Days per week: Not on file    Minutes per session: Not on file  . Stress: Not on file  Relationships  . Social connections:    Talks on phone: Not on file    Gets together: Not on file    Attends religious service: Not on file    Active member of club or organization: Not on file    Attends meetings of clubs or organizations: Not on file    Relationship status: Not on  file  . Intimate partner violence:    Fear of current or ex partner: Not on file    Emotionally abused: Not on file    Physically abused: Not on file    Forced sexual activity: Not on file  Other Topics Concern  . Not on file  Social History Narrative   From [facility]   Retired/disability Clinical research associate   Likes to fish.     Married 1972   3 kids   [place] fan    FAMILY HISTORY:   Family Status  Relation Name Status  . Mother  Deceased       complications of surgery  . Father  Deceased  . Brother  Alive       1 brother, 1 half brother  . Sister  Alive       2 half sisters  . Child  Alive  . Unknown  (Not Specified)  . MGM  Deceased  . MGF  Deceased  . PGM  Deceased  . PGF  Deceased  . Sister  Alive  . Brother  Alive  . Child  Alive  . Child  Alive  . Neg Hx  (Not Specified)    ROS:  Review of Systems  Constitutional: Negative.   HENT: Negative.   Eyes: Negative.   Respiratory: Negative.   Cardiovascular: Negative.   Gastrointestinal: Negative.   Genitourinary: Positive for flank pain and urgency.  Musculoskeletal: Positive for falls.  Skin: Negative.   Neurological: Positive for speech change (hypophonia).  Psychiatric/Behavioral: Positive for hallucinations.     PHYSICAL EXAMINATION:    VITALS:   Vitals:   07/23/17 1418  BP: 104/66  Pulse: 82  SpO2: 92%  Weight: 263 lb (119.3 kg)  Height: _0  (1.753 m)   GEN:  The patient appears stated age and is in NAD. HEENT:  Normocephalic.  Healing scab is noted over the left eyebrow.  The mucous membranes are moist. The superficial temporal arteries are without ropiness or tenderness. CV:  RRR Lungs:  CTAB Neck/HEME:  There are no carotid bruits bilaterally.  Neurological examination:  Orientation: The patient is alert and oriented x3. Cranial nerves: There is good facial symmetry. The speech is fluent and dysarthric but better than at previous visits. Soft palate rises symmetrically and  there is no tongue deviation. Hearing is intact to conversational tone. Sensation: Sensation is intact to light touch throughout Motor: Strength is 5/5 in the bilateral upper and lower extremities.   Shoulder shrug is equal and symmetric.  There is no pronator drift.  Movement examination: Tone: There is normal tone in the upper and lower extremities. Abnormal movements: None Coordination:  There is mild decremation with RAM's, seen mostly with hand opening and closing, but most rapid alternating movements on the left are slower than the right. Gait and Station: The patient pushes off of the chair to arise.  While he is more stable  than last visit, he still has some festinating quality to the gait.    LABS  Lab Results  Component Value Date   WBC 7.8 04/04/2017   HGB 12.5 (L) 04/04/2017   HCT 36.3 (L) 04/04/2017   MCV 82.5 04/04/2017   PLT 138 06/25/2017   Lab Results  Component Value Date   TSH 4.57 (H) 05/17/2017     Chemistry      Component Value Date/Time   NA 134 (L) 05/17/2017 1439   NA 142 12/22/2016 1233   K 3.5 05/17/2017 1439   CL 93 (L) 05/17/2017 1439   CO2 32 05/17/2017 1439   BUN 54 (H) 05/17/2017 1439   BUN 33 (H) 12/22/2016 1233   CREATININE 2.13 06/25/2017   CREATININE 1.53 (H) 05/22/2016 1106      Component Value Date/Time   CALCIUM 8.9 05/17/2017 1439   ALKPHOS 80 05/17/2017 1439   AST 14 05/17/2017 1439   ALT 25 05/17/2017 1439   BILITOT 0.3 05/17/2017 1439     Lab Results  Component Value Date   VITAMINB12 956 (H) 02/22/2017   Lab Results  Component Value Date   HGBA1C 9.6 05/17/2017   DBS programming was performed today, which is described in more detail on a separate programming procedural notes.   ASSESSMENT/PLAN:  1.  Idiopathic Parkinson's disease.    -He is now down to once per day Artane.  I would like to get him off of this medication altogether.  He was agreeable to hold for the weekend and we will call him on Monday.  -titrate  off of pramipexole.  -Increase carbidopa/levodopa 25/100 so that he takes 2 tablets in the morning, 2 in the afternoon and 1 in the evening.  Change dosing time to 8 AM/noon/4 PM.  -Have tried several times to get him off of Artane and he goes back on it.  It is a very small dose, but he thinks it helps balance but with its anticholinergic properties, that does not make a lot of sense.  Regardless, I did not change it today since he is only on it once per day.  He does tell me that he is willing to retry changing this in the future.  I did not change it today because I am changing his other meds  -His DBS battery was last changed on 09/05/2012 and on 06/10/15.    -asks about PT for [facility] and sent referral  -asks about WC.  Given RX for transport chair  -use walker at all times 2.  RBD  -Was initially unable to tolerate clonazepam but wife restarted it and he is doing better on that.  If this does not help, can try Rozerem if insurance will approve. 3.  Pseudobulbar affect (crying)  -Talked at length about this.  States that endocrinologist thought that he was depressed.  He denies depression as does his wife.  He really does not want medication for pseudobulbar affect. 4.  OSAS, noncompliant with CPAP.  -Unfortunately, he has been unable to tolerate CPAP.  Talked about dental appliances, which he does not wish to try either. 5.  CHF  -ICD placed on 11/15/2013 6. Follow up is anticipated in the next few months, sooner should new neurologic issues arise.  Much greater than 50% of this visit was spent in counseling and coordinating care.  Total face to face time:  30 min

## 2017-07-20 ENCOUNTER — Encounter: Payer: Self-pay | Admitting: Family Medicine

## 2017-07-20 LAB — LAB REPORT - SCANNED
Creatinine, Ser: 2.13
FERRITIN: 54
Glucose: 259
HCV Ab: <0.1
HEMOGLOBIN: 11.7
HIV SCREEN 4TH GENERATION: NONREACTIVE
Hepatitis B Surface Antigen: NEGATIVE
Iron: 45
PLATELETS: 138
PTH: 85
VIT D 25 HYDROXY: 27.9

## 2017-07-23 ENCOUNTER — Encounter: Payer: Self-pay | Admitting: Neurology

## 2017-07-23 ENCOUNTER — Ambulatory Visit (INDEPENDENT_AMBULATORY_CARE_PROVIDER_SITE_OTHER): Payer: Medicare Other | Admitting: Neurology

## 2017-07-23 VITALS — BP 104/66 | HR 82 | Ht 69.0 in | Wt 263.0 lb

## 2017-07-23 DIAGNOSIS — G4752 REM sleep behavior disorder: Secondary | ICD-10-CM

## 2017-07-23 DIAGNOSIS — I255 Ischemic cardiomyopathy: Secondary | ICD-10-CM

## 2017-07-23 DIAGNOSIS — H00021 Hordeolum internum right upper eyelid: Secondary | ICD-10-CM | POA: Diagnosis not present

## 2017-07-23 DIAGNOSIS — G20 Parkinson's disease: Secondary | ICD-10-CM | POA: Diagnosis not present

## 2017-07-23 MED ORDER — TRANSPORT CHAIR MISC: 1.0000 | Freq: Every day | 0 refills | Status: DC

## 2017-07-23 MED ORDER — CARBIDOPA-LEVODOPA 25-100 MG PO TABS: ORAL_TABLET | ORAL | 5 refills | Status: DC

## 2017-07-23 NOTE — Patient Instructions (Signed)
1. Increase Carbidopa Levodopa to 2 in the morning, 1 in the afternoon, and 1 in the evening for one week,  At the same time decrease Pramipexole 0.25 mg to 1 tablet twice daily for one week  THEN increase Carbidopa Levodopa to 2 in the morning, 2 in the afternoon, and 1 in the evening At the same time decrease Pramipexole 0.25 mg to 1 tablet in the morning for one week  THEN stay on Carbidopa Levodopa 2 in the morning, 2 in the afternoon, and 1 in the evening and STOP Pramipexole  2. We will refer you to [facility] for physical therapy

## 2017-07-24 ENCOUNTER — Other Ambulatory Visit (INDEPENDENT_AMBULATORY_CARE_PROVIDER_SITE_OTHER): Payer: Medicare Other

## 2017-07-24 DIAGNOSIS — M109 Gout, unspecified: Secondary | ICD-10-CM

## 2017-07-24 LAB — BASIC METABOLIC PANEL
BUN: 35 mg/dL — ABNORMAL HIGH (ref 6–23)
CALCIUM: 8.4 mg/dL (ref 8.4–10.5)
CO2: 33 mEq/L — ABNORMAL HIGH (ref 19–32)
Chloride: 98 mEq/L (ref 96–112)
Creatinine, Ser: 1.87 mg/dL — ABNORMAL HIGH (ref 0.40–1.50)
GFR: 38.59 mL/min — AB (ref >60.00)
Glucose, Bld: 267 mg/dL — ABNORMAL HIGH (ref 70–99)
POTASSIUM: 3.6 meq/L (ref 3.5–5.1)
SODIUM: 139 meq/L (ref 135–145)

## 2017-07-24 LAB — URIC ACID: URIC ACID, SERUM: 9.2 mg/dL — AB (ref 4.0–7.8)

## 2017-07-25 ENCOUNTER — Ambulatory Visit (INDEPENDENT_AMBULATORY_CARE_PROVIDER_SITE_OTHER): Payer: Medicare Other | Admitting: *Deleted

## 2017-07-25 DIAGNOSIS — R2689 Other abnormalities of gait and mobility: Secondary | ICD-10-CM | POA: Diagnosis not present

## 2017-07-25 DIAGNOSIS — M545 Low back pain: Secondary | ICD-10-CM | POA: Diagnosis not present

## 2017-07-25 DIAGNOSIS — R2681 Unsteadiness on feet: Secondary | ICD-10-CM | POA: Diagnosis not present

## 2017-07-25 DIAGNOSIS — I255 Ischemic cardiomyopathy: Secondary | ICD-10-CM | POA: Diagnosis not present

## 2017-07-25 NOTE — Progress Notes (Signed)
Remote ICD transmission.   

## 2017-07-26 ENCOUNTER — Encounter: Payer: Self-pay | Admitting: Family Medicine

## 2017-07-26 ENCOUNTER — Encounter: Payer: Self-pay | Admitting: Cardiology

## 2017-07-26 DIAGNOSIS — L918 Other hypertrophic disorders of the skin: Secondary | ICD-10-CM | POA: Diagnosis not present

## 2017-07-26 DIAGNOSIS — D1801 Hemangioma of skin and subcutaneous tissue: Secondary | ICD-10-CM | POA: Diagnosis not present

## 2017-07-26 DIAGNOSIS — L821 Other seborrheic keratosis: Secondary | ICD-10-CM | POA: Diagnosis not present

## 2017-07-26 DIAGNOSIS — D225 Melanocytic nevi of trunk: Secondary | ICD-10-CM | POA: Diagnosis not present

## 2017-07-27 ENCOUNTER — Encounter: Payer: Self-pay | Admitting: *Deleted

## 2017-07-29 ENCOUNTER — Other Ambulatory Visit: Payer: Self-pay | Admitting: Family Medicine

## 2017-07-29 DIAGNOSIS — M109 Gout, unspecified: Secondary | ICD-10-CM

## 2017-07-29 MED ORDER — ALLOPURINOL 100 MG PO TABS: 100.0000 mg | ORAL_TABLET | Freq: Every day | ORAL | 3 refills | Status: DC

## 2017-07-30 ENCOUNTER — Encounter: Payer: Self-pay | Admitting: *Deleted

## 2017-07-30 DIAGNOSIS — H00021 Hordeolum internum right upper eyelid: Secondary | ICD-10-CM | POA: Diagnosis not present

## 2017-07-31 LAB — CUP PACEART REMOTE DEVICE CHECK
Battery Remaining Longevity: 87 mo
Battery Voltage: 2.99 V
Brady Statistic RA Percent Paced: 0.28 %
HighPow Impedance: 70 Ohm
Implantable Lead Implant Date: 20151105
Implantable Lead Location: 753860
Implantable Pulse Generator Implant Date: 20151105
Lead Channel Impedance Value: 399 Ohm
Lead Channel Impedance Value: 494 Ohm
Lead Channel Pacing Threshold Amplitude: 0.75 V
Lead Channel Pacing Threshold Pulse Width: 0.4 ms
Lead Channel Sensing Intrinsic Amplitude: 1.875 mV
Lead Channel Sensing Intrinsic Amplitude: 1.875 mV
Lead Channel Setting Pacing Amplitude: 2 V
MDC IDC LEAD IMPLANT DT: 20151105
MDC IDC LEAD LOCATION: 753859
MDC IDC MSMT LEADCHNL RV IMPEDANCE VALUE: 399 Ohm
MDC IDC MSMT LEADCHNL RV PACING THRESHOLD AMPLITUDE: 0.375 V
MDC IDC MSMT LEADCHNL RV PACING THRESHOLD PULSEWIDTH: 0.4 ms
MDC IDC MSMT LEADCHNL RV SENSING INTR AMPL: 17 mV
MDC IDC MSMT LEADCHNL RV SENSING INTR AMPL: 17 mV
MDC IDC SESS DTM: 20190717062603
MDC IDC SET LEADCHNL RV PACING AMPLITUDE: 2.5 V
MDC IDC SET LEADCHNL RV PACING PULSEWIDTH: 0.4 ms
MDC IDC SET LEADCHNL RV SENSING SENSITIVITY: 0.3 mV
MDC IDC STAT BRADY AP VP PERCENT: 0 %
MDC IDC STAT BRADY AP VS PERCENT: 0.28 %
MDC IDC STAT BRADY AS VP PERCENT: 0.04 %
MDC IDC STAT BRADY AS VS PERCENT: 99.68 %
MDC IDC STAT BRADY RV PERCENT PACED: 0.04 %

## 2017-08-01 ENCOUNTER — Other Ambulatory Visit: Payer: Self-pay | Admitting: Endocrinology

## 2017-08-01 DIAGNOSIS — R2681 Unsteadiness on feet: Secondary | ICD-10-CM | POA: Diagnosis not present

## 2017-08-01 DIAGNOSIS — M545 Low back pain: Secondary | ICD-10-CM | POA: Diagnosis not present

## 2017-08-01 DIAGNOSIS — R2689 Other abnormalities of gait and mobility: Secondary | ICD-10-CM | POA: Diagnosis not present

## 2017-08-02 ENCOUNTER — Other Ambulatory Visit: Payer: Self-pay | Admitting: Family Medicine

## 2017-08-02 NOTE — Telephone Encounter (Signed)
Electronic refill request. Colchicine Last office visit:   06/05/17 Last Filled:    30 tablet 1 06/05/2017  Please advise.

## 2017-08-03 DIAGNOSIS — R2681 Unsteadiness on feet: Secondary | ICD-10-CM | POA: Diagnosis not present

## 2017-08-03 DIAGNOSIS — R2689 Other abnormalities of gait and mobility: Secondary | ICD-10-CM | POA: Diagnosis not present

## 2017-08-03 DIAGNOSIS — M545 Low back pain: Secondary | ICD-10-CM | POA: Diagnosis not present

## 2017-08-03 NOTE — Telephone Encounter (Signed)
Sent. Thanks.   

## 2017-08-08 DIAGNOSIS — M545 Low back pain: Secondary | ICD-10-CM | POA: Diagnosis not present

## 2017-08-08 DIAGNOSIS — R2689 Other abnormalities of gait and mobility: Secondary | ICD-10-CM | POA: Diagnosis not present

## 2017-08-08 DIAGNOSIS — R2681 Unsteadiness on feet: Secondary | ICD-10-CM | POA: Diagnosis not present

## 2017-08-14 ENCOUNTER — Other Ambulatory Visit: Payer: Self-pay | Admitting: Family Medicine

## 2017-08-14 NOTE — Telephone Encounter (Signed)
Please address in Dr.Duncan's absence  Name of Med- Tramadol 50mg  [facility] Last fill and quantity- 05/27/17 #60/1 LOV 06/05/17 Next OV 04/16/18 Last controlled substance agreement- 01/04/12 Last UDS- none

## 2017-08-15 DIAGNOSIS — R2689 Other abnormalities of gait and mobility: Secondary | ICD-10-CM | POA: Diagnosis not present

## 2017-08-15 DIAGNOSIS — R2681 Unsteadiness on feet: Secondary | ICD-10-CM | POA: Diagnosis not present

## 2017-08-15 DIAGNOSIS — M545 Low back pain: Secondary | ICD-10-CM | POA: Diagnosis not present

## 2017-08-16 NOTE — Telephone Encounter (Signed)
Eprescribed.

## 2017-08-17 ENCOUNTER — Encounter: Payer: Self-pay | Admitting: Internal Medicine

## 2017-08-17 DIAGNOSIS — M545 Low back pain: Secondary | ICD-10-CM | POA: Diagnosis not present

## 2017-08-17 DIAGNOSIS — R2689 Other abnormalities of gait and mobility: Secondary | ICD-10-CM | POA: Diagnosis not present

## 2017-08-17 DIAGNOSIS — R2681 Unsteadiness on feet: Secondary | ICD-10-CM | POA: Diagnosis not present

## 2017-08-22 DIAGNOSIS — M545 Low back pain: Secondary | ICD-10-CM | POA: Diagnosis not present

## 2017-08-22 DIAGNOSIS — R2681 Unsteadiness on feet: Secondary | ICD-10-CM | POA: Diagnosis not present

## 2017-08-22 DIAGNOSIS — R2689 Other abnormalities of gait and mobility: Secondary | ICD-10-CM | POA: Diagnosis not present

## 2017-08-24 ENCOUNTER — Other Ambulatory Visit: Payer: Self-pay | Admitting: Neurology

## 2017-08-24 ENCOUNTER — Encounter: Payer: Self-pay | Admitting: Family Medicine

## 2017-08-24 ENCOUNTER — Other Ambulatory Visit: Payer: Self-pay | Admitting: Family Medicine

## 2017-08-24 DIAGNOSIS — W57XXXA Bitten or stung by nonvenomous insect and other nonvenomous arthropods, initial encounter: Secondary | ICD-10-CM

## 2017-08-24 MED ORDER — BETAMETHASONE DIPROPIONATE 0.05 % EX CREA: TOPICAL_CREAM | Freq: Two times a day (BID) | CUTANEOUS | 0 refills | Status: DC | PRN

## 2017-08-27 DIAGNOSIS — R2681 Unsteadiness on feet: Secondary | ICD-10-CM | POA: Diagnosis not present

## 2017-08-27 DIAGNOSIS — M545 Low back pain: Secondary | ICD-10-CM | POA: Diagnosis not present

## 2017-08-27 DIAGNOSIS — R2689 Other abnormalities of gait and mobility: Secondary | ICD-10-CM | POA: Diagnosis not present

## 2017-08-29 ENCOUNTER — Telehealth: Payer: Self-pay | Admitting: Endocrinology

## 2017-08-29 NOTE — Telephone Encounter (Signed)
He can have 2 vials at a time and instruction will be to use 76 units instead of 56 in his pump daily

## 2017-08-29 NOTE — Telephone Encounter (Signed)
Patient can not get his Humalog through the mail order until the 24th. He would like to know if Dr Dwyane Dee could send in one bottle to the pharmacy till he can get his insulin  [id number] [place], [place] - [address]

## 2017-08-29 NOTE — Telephone Encounter (Signed)
Ok to send please advise

## 2017-08-30 ENCOUNTER — Telehealth: Payer: Self-pay | Admitting: Endocrinology

## 2017-08-30 ENCOUNTER — Other Ambulatory Visit: Payer: Self-pay

## 2017-08-30 ENCOUNTER — Encounter: Payer: Self-pay | Admitting: Internal Medicine

## 2017-08-30 DIAGNOSIS — R2681 Unsteadiness on feet: Secondary | ICD-10-CM | POA: Diagnosis not present

## 2017-08-30 DIAGNOSIS — R2689 Other abnormalities of gait and mobility: Secondary | ICD-10-CM | POA: Diagnosis not present

## 2017-08-30 DIAGNOSIS — M545 Low back pain: Secondary | ICD-10-CM | POA: Diagnosis not present

## 2017-08-30 MED ORDER — INSULIN LISPRO 100 UNIT/ML ~~LOC~~ SOLN: SUBCUTANEOUS | 2 refills | Status: DC

## 2017-08-30 NOTE — Telephone Encounter (Signed)
Patient just used the last of his Humalog. Patient's wife called earlier and has heard nothing back. Please send RX [facility] [address]. Only 1 bottle-will receive medication through the mail after 09/01/17

## 2017-08-30 NOTE — Telephone Encounter (Signed)
Done

## 2017-08-30 NOTE — Telephone Encounter (Signed)
Patient needs RX for [facility] [address]-just 1 bottle to get him through to the 24th when he will receive his mail order. Please call ph# [phone] to discuss -he will be out of Humalog today. Second request, waiting to hear back from our office.

## 2017-08-31 ENCOUNTER — Other Ambulatory Visit: Payer: Self-pay

## 2017-08-31 ENCOUNTER — Telehealth: Payer: Self-pay

## 2017-08-31 MED ORDER — INSULIN LISPRO 100 UNIT/ML ~~LOC~~ SOLN: SUBCUTANEOUS | 2 refills | Status: DC

## 2017-08-31 NOTE — Telephone Encounter (Signed)
This has been resolved prescription changed and sent to mail order pharmacy

## 2017-08-31 NOTE — Telephone Encounter (Signed)
Samples of this drug were given to the patient, quantity 2, Lot Number [id number]. Patient was out of insulin and stated his delivery pharmacy will not get it to him until the end of next week

## 2017-09-03 ENCOUNTER — Other Ambulatory Visit: Payer: Self-pay

## 2017-09-03 DIAGNOSIS — M545 Low back pain: Secondary | ICD-10-CM | POA: Diagnosis not present

## 2017-09-03 DIAGNOSIS — R2681 Unsteadiness on feet: Secondary | ICD-10-CM | POA: Diagnosis not present

## 2017-09-03 DIAGNOSIS — R2689 Other abnormalities of gait and mobility: Secondary | ICD-10-CM | POA: Diagnosis not present

## 2017-09-03 NOTE — Telephone Encounter (Signed)
Patients wife pick up a sample on friday

## 2017-09-04 ENCOUNTER — Encounter

## 2017-09-04 ENCOUNTER — Ambulatory Visit: Payer: Medicare Other | Admitting: Neurology

## 2017-09-04 ENCOUNTER — Other Ambulatory Visit: Payer: Self-pay | Admitting: Neurology

## 2017-09-06 DIAGNOSIS — R2689 Other abnormalities of gait and mobility: Secondary | ICD-10-CM | POA: Diagnosis not present

## 2017-09-06 DIAGNOSIS — R2681 Unsteadiness on feet: Secondary | ICD-10-CM | POA: Diagnosis not present

## 2017-09-06 DIAGNOSIS — M545 Low back pain: Secondary | ICD-10-CM | POA: Diagnosis not present

## 2017-09-12 ENCOUNTER — Ambulatory Visit (INDEPENDENT_AMBULATORY_CARE_PROVIDER_SITE_OTHER): Payer: Medicare Other | Admitting: Podiatry

## 2017-09-12 ENCOUNTER — Encounter: Payer: Self-pay | Admitting: Podiatry

## 2017-09-12 DIAGNOSIS — M21619 Bunion of unspecified foot: Secondary | ICD-10-CM | POA: Diagnosis not present

## 2017-09-12 DIAGNOSIS — I255 Ischemic cardiomyopathy: Secondary | ICD-10-CM

## 2017-09-12 DIAGNOSIS — M79674 Pain in right toe(s): Secondary | ICD-10-CM

## 2017-09-12 DIAGNOSIS — B351 Tinea unguium: Secondary | ICD-10-CM

## 2017-09-12 DIAGNOSIS — M79675 Pain in left toe(s): Secondary | ICD-10-CM | POA: Diagnosis not present

## 2017-09-12 DIAGNOSIS — E1149 Type 2 diabetes mellitus with other diabetic neurological complication: Secondary | ICD-10-CM

## 2017-09-12 DIAGNOSIS — E1140 Type 2 diabetes mellitus with diabetic neuropathy, unspecified: Secondary | ICD-10-CM | POA: Diagnosis not present

## 2017-09-12 NOTE — Progress Notes (Signed)
   Subjective:    Patient ID: Henry [name], male    DOB: [birth year], 67 y.o.   MRN: [id number]  HPI    Review of Systems  All other systems reviewed and are negative.      Objective:   Physical Exam        Assessment & Plan:

## 2017-09-13 DIAGNOSIS — M545 Low back pain: Secondary | ICD-10-CM | POA: Diagnosis not present

## 2017-09-13 DIAGNOSIS — R2689 Other abnormalities of gait and mobility: Secondary | ICD-10-CM | POA: Diagnosis not present

## 2017-09-13 DIAGNOSIS — R2681 Unsteadiness on feet: Secondary | ICD-10-CM | POA: Diagnosis not present

## 2017-09-13 NOTE — Progress Notes (Signed)
Subjective:   Patient ID: Henry [name], male   DOB: 67 y.o.   MRN: [id number]   HPI Patient presents with caregiver stating that he cannot take care of his nails anymore he has diabetes with long-term Parkinson's and is developing structural bunion right over left is painful red knows he needs new shoes to try to accommodate it.  Patient does not smoke and is not active and has a elevated A1c of 9.6   Review of Systems  All other systems reviewed and are negative.       Objective:  Physical Exam  Constitutional: He appears well-developed and well-nourished.  Cardiovascular: Intact distal pulses.  Pulmonary/Chest: Effort normal.  Musculoskeletal: Normal range of motion.  Neurological: He is alert.  Skin: Skin is warm.  Nursing note and vitals reviewed.   I noted pulses to be present but weak with diminishment of neurological sharp dull vibratory with patient found to have thickened elongated nailbeds 1-5 both feet that are incurvated and tender and also was noted to have structural bunion deformity right with redness around the first MPJ right over left foot.  Patient does not have any open areas or ulcerations and it appears to be a localized condition and I did note good digital perfusion     Assessment:  At risk patient with mycotic nail infection with pain with structural deformity right over left     Plan:  H&P condition reviewed and today debridement of nails accomplished along with diabetic foot education.  I discussed long-term diabetic shoes which I think would be of benefit to him given his structural deformity elevated sugars and neurological condition.  Patient wants to pursue this and we will get approval through his physician

## 2017-09-14 ENCOUNTER — Other Ambulatory Visit: Payer: Self-pay | Admitting: Family Medicine

## 2017-09-15 ENCOUNTER — Other Ambulatory Visit: Payer: Self-pay | Admitting: Family Medicine

## 2017-09-16 ENCOUNTER — Other Ambulatory Visit: Payer: Self-pay | Admitting: Neurology

## 2017-09-17 ENCOUNTER — Other Ambulatory Visit: Payer: Self-pay | Admitting: Endocrinology

## 2017-09-17 ENCOUNTER — Other Ambulatory Visit (INDEPENDENT_AMBULATORY_CARE_PROVIDER_SITE_OTHER): Payer: Medicare Other

## 2017-09-17 DIAGNOSIS — M545 Low back pain: Secondary | ICD-10-CM | POA: Diagnosis not present

## 2017-09-17 DIAGNOSIS — M109 Gout, unspecified: Secondary | ICD-10-CM

## 2017-09-17 DIAGNOSIS — R2681 Unsteadiness on feet: Secondary | ICD-10-CM | POA: Diagnosis not present

## 2017-09-17 DIAGNOSIS — R2689 Other abnormalities of gait and mobility: Secondary | ICD-10-CM | POA: Diagnosis not present

## 2017-09-17 LAB — BASIC METABOLIC PANEL
BUN: 53 mg/dL — AB (ref 6–23)
CALCIUM: 9.6 mg/dL (ref 8.4–10.5)
CHLORIDE: 91 meq/L — AB (ref 96–112)
CO2: 36 mEq/L — ABNORMAL HIGH (ref 19–32)
CREATININE: 2.24 mg/dL — AB (ref 0.40–1.50)
GFR: 31.32 mL/min — ABNORMAL LOW (ref >60.00)
Glucose, Bld: 225 mg/dL — ABNORMAL HIGH (ref 70–99)
Potassium: 3.3 mEq/L — ABNORMAL LOW (ref 3.5–5.1)
Sodium: 137 mEq/L (ref 135–145)

## 2017-09-17 LAB — URIC ACID: Uric Acid, Serum: 9.4 mg/dL — ABNORMAL HIGH (ref 4.0–7.8)

## 2017-09-17 MED ORDER — INSULIN GLARGINE 100 UNIT/ML SOLOSTAR PEN: 20.0000 [IU] | PEN_INJECTOR | Freq: Every day | SUBCUTANEOUS | 1 refills | Status: DC

## 2017-09-17 NOTE — Telephone Encounter (Signed)
Rx lantus solo pen Disp-20, R-1 sent to [facility]

## 2017-09-18 NOTE — Progress Notes (Signed)
Patient ID: Henry [name], male   DOB: 11-04-1950, 67 y.o.   MRN: [id number]           Reason for Appointment: Follow-up for Type 2 Diabetes   History of Present Illness:          Diagnosis: Type 2 diabetes mellitus, date of diagnosis: 2000      Past history:   Patient thinks he has been taking Amaryl for several years and probably metformin since onset also At some point he was changed from metformin to [facility] and was taking this since at least 2012 A1c had been higher in 2015 Since his A1c had been progressively higher with his regimen of Janumet and Amaryl he was started on Victoza in 01/2014 He was started on Lantus insulin in 5/16 because of persistent hyperglycemia especially fasting; was having readings as high as 293 Because of tendency to high postprandial readings and for control with Lantus he was switched to the V-go pump in  09/2014  Recent history:   INSULIN regimen: V-go-40 pump and boluses 8-14 units.  Lantus 40 units at bedtime  Non-insulin hypoglycemic drugs the patient is taking are: Metformin 1 g a day, Victoza 1.8 mg daily in am   His A1c was last 9.6 and now 8.4  Current management, blood sugar patterns and problems identified:  He is requiring large doses of insulin now and equivalent of 80 units of basal insulin  He says he is taking 40 units Lantus most of the time, previously on 30  With this she will have an occasional normal blood sugar in the morning just over 100  However this is only occurring when he is not eating snacks or sweets at night  His wife thinks that his blood sugars are high during the day because he is eating throughout the day  He is taking his insulin with the boluses with his meals but probably not covering snacks  Most of his blood sugars throughout the day are over 200, recently which eating birthday cake his blood sugars for well over 300 also  He does try to check his sugars fairly frequently now  No hypoglycemia at  any time  Not able to control his appetite or weight taking Victoza consistently, compliance is not been good despite visiting the dietitian  He has seen the dietitian in 3/16  Not able to exercise because of back pain and musculoskeletal issues and balance       Side effects from medications have been: None Compliance with the medical regimen: Fair  Hypoglycemia:   none  Glucose monitoring:  done 1-2 times  a day         Glucometer: One Touch ultra 2 .      Blood Glucose readings by download of glucose monitor   PRE-MEAL Fasting Lunch Dinner Bedtime Overall  Glucose range:  112-372  250, 300  159-282  210-395   Mean/median:     229     Self-care: The diet that the patient has been following is: None, some fried food and cookies, mostly sugar-free         Meals: 9 am , 2-3 pm and 6-7 pm              Dietician visit, most recent: 4/19               Weight history:   Wt Readings from Last 3 Encounters:  09/19/17 259 lb (117.5 kg)  07/23/17 263 lb (119.3 kg)  07/17/17 260 lb 6.4 oz (118.1 kg)    Glycemic control:   Lab Results  Component Value Date   HGBA1C 8.4 (A) 09/19/2017   HGBA1C 9.6 05/17/2017   HGBA1C 8.3 (H) 02/05/2017   Lab Results  Component Value Date   MICROALBUR <0.7 02/05/2017   LDLCALC 118 (H) 09/16/2015   CREATININE 2.24 (H) 09/17/2017    Lab Results  Component Value Date   FRUCTOSAMINE 350 (H) 03/29/2017   FRUCTOSAMINE 250 09/16/2015   FRUCTOSAMINE 259 12/15/2014     Other active problems: See review of systems      Allergies as of 09/19/2017      Reactions   Penicillins Rash, Other (See Comments)   WEARS ALLERGY BRACELET Because of a history of documented adverse serious drug reaction;Medi Alert bracelet  is recommended Has patient had a PCN reaction causing immediate rash, facial/tongue/throat swelling, SOB or lightheadedness with hypotension: Yes Has patient had a PCN reaction causing severe rash involving mucus membranes or skin  necrosis: unknown Has patient had a PCN reaction that required hospitalization NO Has patient had a PCN reaction occurring within the last 10 years: NO If all of    Peanut-containing Drug Products Other (See Comments)   cough   Watermelon [citrullus Vulgaris]    Tickle in throat and cough      Medication List        Accurate as of 09/19/17  1:50 PM. Always use your most recent med list.          acetaminophen 325 MG tablet Commonly known as:  TYLENOL Take 1-2 tablets (325-650 mg total) by mouth every 4 (four) hours as needed for mild pain.   albuterol 108 (90 Base) MCG/ACT inhaler Commonly known as:  PROVENTIL HFA;VENTOLIN HFA Inhale 2 puffs into the lungs every 6 (six) hours as needed for wheezing or shortness of breath.   allopurinol 100 MG tablet Commonly known as:  ZYLOPRIM Take 1 tablet (100 mg total) by mouth daily.   aspirin EC 81 MG tablet Take 81 mg by mouth daily.   betamethasone dipropionate 0.05 % cream Commonly known as:  DIPROLENE Apply topically 2 (two) times daily as needed. Use sparingly.   carbidopa-levodopa 25-100 MG tablet Commonly known as:  SINEMET IR 2 in the morning, 2 in the afternoon, 1 in the evening   clonazePAM 0.5 MG tablet Commonly known as:  KLONOPIN Take 1 tablet (0.5 mg total) by mouth at bedtime.   colchicine 0.6 MG tablet TAKE 1 TABLET BY MOUTH TWICE A DAY AS NEEDED FOR GOUT   Fluticasone-Salmeterol 250-50 MCG/DOSE Aepb Commonly known as:  ADVAIR Inhale 1 puff into the lungs 2 (two) times daily.   furosemide 80 MG tablet Commonly known as:  LASIX TAKE 1 & 1/2 TABLETS DAILY   gabapentin 100 MG capsule Commonly known as:  NEURONTIN TAKE 1 TO 2 CAPSULES (100-200 MG TOTAL) BY MOUTH 3 (THREE) TIMES DAILY AS NEEDED.   Insulin Glargine 100 UNIT/ML Solostar Pen Commonly known as:  LANTUS Inject 20 Units into the skin daily at 10 pm.   insulin lispro 100 UNIT/ML injection Commonly known as:  HUMALOG USE MAX 76 UNITS PER DAY    WITH V-GO PUMP   Insulin Lispro 200 UNIT/ML Sopn Inject 20 Units into the skin 3 (three) times daily.   Insulin Pen Needle 32G X 5 MM Misc USE AS DIRECTED EVERY DAY   Insulin Pen Needle 32G X 4 MM Misc Used to give daily insulin injections.  KLOR-CON M20 20 MEQ tablet Generic drug:  potassium chloride SA TAKE 2 TABLETS (40 MEQ TOTAL) BY MOUTH DAILY.   levothyroxine 125 MCG tablet Commonly known as:  SYNTHROID, LEVOTHROID TAKE 1 TABLET EVERY DAY DAILY BEFORE BREAKFAST   lisinopril 2.5 MG tablet Commonly known as:  PRINIVIL,ZESTRIL TAKE 1 TABLET BY MOUTH DAILY.   metolazone 2.5 MG tablet Commonly known as:  ZAROXOLYN TAKE 1 TABLET BY MOUTH EVERY DAY AS NEEDED FOR >3 LB IN A DAY OR >5 LB IN A WEEK   metoprolol succinate 50 MG 24 hr tablet Commonly known as:  TOPROL-XL TAKE 1 TABLET BY MOUTH DAILY. TAKE WITH OR IMMEDIATELY FOLLOWING A MEAL.   multivitamin with minerals Tabs tablet Take 1 tablet by mouth daily.   MYRBETRIQ 50 MG Tb24 tablet Generic drug:  mirabegron ER Take 50 mg by mouth daily.   nitroGLYCERIN 0.4 MG SL tablet Commonly known as:  NITROSTAT Place 1 tablet (0.4 mg total) under the tongue every 5 (five) minutes as needed for chest pain.   ONETOUCH DELICA LANCETS 10C Misc Use to check blood sugar 2 times per day dx code E11.49   ONETOUCH VERIO test strip Generic drug:  glucose blood USE AS DIRECTED TO CHECK BLOOD SUGAR TWICE A DAY   rosuvastatin 20 MG tablet Commonly known as:  CRESTOR Take 1 tablet (20 mg total) daily by mouth.   traMADol 50 MG tablet Commonly known as:  ULTRAM TAKE 1 TABLET BY MOUTH TWICE A DAY AS NEEDED   Transport Chair Misc 1 Device by Does not apply route daily.   travoprost (benzalkonium) 0.004 % ophthalmic solution Commonly known as:  TRAVATAN Place 1 drop into the right eye at bedtime.   trihexyphenidyl 2 MG tablet Commonly known as:  ARTANE TAKE 1 TABLET BY MOUTH EVERY DAY   V-GO 40 Kit Use one pod daily with  insulin   VICTOZA 18 MG/3ML Sopn Generic drug:  liraglutide INJECT 1.8MG ONCE DAILY   vitamin B-12 1000 MCG tablet Commonly known as:  CYANOCOBALAMIN Take 1,000 mcg by mouth daily.       Allergies:  Allergies  Allergen Reactions  . Penicillins Rash and Other (See Comments)    WEARS ALLERGY BRACELET Because of a history of documented adverse serious drug reaction;Medi Alert bracelet  is recommended Has patient had a PCN reaction causing immediate rash, facial/tongue/throat swelling, SOB or lightheadedness with hypotension: Yes Has patient had a PCN reaction causing severe rash involving mucus membranes or skin necrosis: unknown Has patient had a PCN reaction that required hospitalization NO Has patient had a PCN reaction occurring within the last 10 years: NO If all of   . Peanut-Containing Drug Products Other (See Comments)    cough  . Watermelon [Citrullus Vulgaris]     Tickle in throat and cough    Past Medical History:  Diagnosis Date  . AICD (automatic cardioverter/defibrillator) present    Dr Lovena Le office visit yearly, MDT   . Arthritis    cane  . Asthma   . Benign neoplasm of colon   . CAD (coronary artery disease)   . Cardiomyopathy   . Cataract   . Complication of anesthesia    pt states that he got a rash  . Constipation   . 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 DM (diabetes mellitus) ([place])    TYPE 2   . GERD (gastroesophageal reflux disease)   . Glaucoma    right eye  .  HLD (hyperlipidemia)   . HTN (hypertension)    pt denies 08/19/12  . Hyperplasia, prostate   . Hyperthyroidism    thyroid lobectomy  . MI (myocardial infarction) [facility])    Dr Stanford Breed 2000, x3vessels bypass  . Nephrolithiasis   . OSA (obstructive sleep apnea)    AHI-28,on CPAP, noncompliant with CPAP  . Parkinson disease ([place])    1999  . PONV (postoperative nausea and vomiting)   . Restless legs   . Shortness of breath    Hx: of at all times  . Sleep  apnea    does not use CPAP  . Sleep apnea, organic   . UTI (lower urinary tract infection) 09/15/12   Klebsiella  . Ventral hernia     Past Surgical History:  Procedure Laterality Date  . 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 CARDIAC CATHETERIZATION    . CATARACT EXTRACTION W/ INTRAOCULAR LENS IMPLANT     Hx: of right eye  . CATARACT EXTRACTION W/ INTRAOCULAR LENS IMPLANT Left 2018  . COLONOSCOPY N/A 10/13/2014   Procedure: COLONOSCOPY;  Surgeon: Jerene Bears, MD;  Location: [facility] ENDOSCOPY;  Service: Gastroenterology;  Laterality: N/A;  . COLONOSCOPY W/ BIOPSIES AND POLYPECTOMY     Hx: of  . CORONARY ARTERY BYPASS GRAFT  2000   [name], MD  . [place]  . DEEP BRAIN STIMULATOR PLACEMENT  2004   Right and left VIN stimulator placement (parkinsons)  . EYE SURGERY    . FINGER AMPUTATION     left pointer  . IMPLANTABLE CARDIOVERTER DEFIBRILLATOR IMPLANT N/A 11/13/2013   Procedure: IMPLANTABLE CARDIOVERTER DEFIBRILLATOR IMPLANT;  Surgeon: Evans Lance, MD;  Location[facility] C[facility] LAB;  Service: Cardiovascular;  Laterality: N/A;  . INSERT / REPLACE / REMOVE PACEMAKER    . LEFT AND RIGHT HEART CATHETERIZATION WITH CORONARY ANGIOGRAM N/A 09/24/2013   Procedure: LEFT AND RIGHT HEART CATHETERIZATION WITH CORONARY ANGIOGRAM;  Surgeon: Burnell Blanks, MD;  Location: [facility] CATH LAB;  Service: Cardiovascular;  Laterality: N/A;  . LITHOTRIPSY     3 different times  . MEDIAN STERNOTOMY  2000  . SUBTHALAMIC STIMULATOR BATTERY REPLACEMENT N/A 09/05/2012   Procedure: Deep brain stimulator battery change;  Surgeon: Erline Levine, MD;  Location: [facility] NEURO ORS;  Service: Neurosurgery;  Laterality: N/A;  Deep brain stimulator battery change  . SUBTHALAMIC STIMULATOR BATTERY REPLACEMENT N/A 06/10/2015   Procedure: Deep Brain stimulator battery change;  Surgeon: Erline Levine, MD;  Location: [facility]URO ORS;  Service: Neurosurgery;  Laterality: N/A;  . TONSILLECTOMY       Family History  Problem Relation Age of Onset  . Aneurysm Mother   . Alcoholism Father   . HIV/AIDS Brother 54       AIDS  . Healthy Child   . Peripheral vascular disease Unknown   . Arthritis Unknown   . Healthy Child   . Healthy Child   . Diabetes Neg Hx   . Heart disease Neg Hx   . Colon cancer Neg Hx   . Prostate cancer Neg Hx     Social History:  reports that he has never smoked. He has never used smokeless tobacco. He reports that he drinks alcohol. He reports that he does not use drugs.    Review of Systems   Gout of Left MTP joint on 05/03/17: better but no uric acid level recently   Most recent eye exam was done in 04/2017  HYPERLIPIDEMIA:  He is on Crestor 20 mg with good control of LDL, has low HDL Triglycerides are persistently over 200, he is on Lovaza       Lab Results  Component Value Date   CHOL 123 04/04/2017   CHOL 111 10/16/2016   CHOL 132 12/06/2015   Lab Results  Component Value Date   HDL 32.30 (L) 04/04/2017   HDL 27.80 (L) 10/16/2016   HDL 30.20 (L) 12/06/2015   Lab Results  Component Value Date   LDLCALC 118 (H) 09/16/2015   LDLCALC 126 (H) 04/08/2013   LDLCALC 51 02/28/2012   Lab Results  Component Value Date   TRIG 232.0 (H) 04/04/2017   TRIG 287.0 (H) 10/16/2016   TRIG 217.0 (H) 12/06/2015   Lab Results  Component Value Date   CHOLHDL 4 04/04/2017   CHOLHDL 4 10/16/2016   CHOLHDL 4 12/06/2015   Lab Results  Component Value Date   LDLDIRECT 61.0 04/04/2017   LDLDIRECT 52.0 10/16/2016   LDLDIRECT 69.0 12/06/2015                  Thyroid:   He has had hypothyroidism following radioactive iodine treatment for hyperthyroidism in 2013  TSH is usually normal with  dosage of 125 g, slightly higher in 5/19   Lab Results  Component Value Date   TSH 4.57 (H) 05/17/2017   TSH 3.62 02/05/2017   TSH 2.35 03/29/2016   FREET4 0.74 02/05/2017   FREET4 0.82 08/20/2014   FREET4 0.98 11/20/2011       HYPERTENSION has  been followed by  other physicians  Also on diuretics from cardiologist  CKD: Has variable creatinine levels   Lab Results  Component Value Date   CREATININE 2.24 (H) 09/17/2017   CREATININE 1.87 (H) 07/24/2017   CREATININE 2.13 06/25/2017    He is asking what pains in his feet.  Has been told to take extra gabapentin but he does not do so  Diabetic foot exam in 04/2017 showed  normal monofilament sensation in the toes except decreased on the right great toe and plantar surfaces, no skin lesions or ulcers on the feet and normal pedal pulses    Physical Examination:  BP 122/82   Pulse 70   Ht _0  (1.753 m)   Wt 259 lb (117.5 kg)   SpO2 95%   BMI 38.25 kg/m      ASSESSMENT/PLAN  Diabetes type 2, uncontrolled with obesity     See history of present illness for detailed discussion of his current management, blood sugar patterns and problems identified  His A1c last was 9.6 and is now relatively better at 8.4  His blood sugars still averaging over 200 at home Again his difficulty with blood sugar control is related to poor diet and not monitoring portions, snacks, carbohydrates and sweets Only occasionally when he is watching his diet overnight his fasting reading may be close to 100 Otherwise blood sugars are usually over 200 throughout the day  This is despite now taking a total of 80 units of basal insulin including with the pump He is likely not getting enough insulin to control his mealtime hyperglycemia since he has limited amount of insulin available with his V-go pump and is still not taking the maximum amount for his meals Not clear if Victoza is adequate also Again has difficulty with weight loss and currently BMI 38   Recommendations:   He needs to start improving his diet consistently  No change  in Lantus and does not need to double dose this even if blood sugars are high  He will take this consistently at suppertime  Continue Victoza 1.8,  currently not a candidate for weight loss medication because of multiple problems and medications as well as likely insurance difficulties  Increase mealtime insulin to 12 units at breakfast and 16 for lunch but may add another 4 units when blood sugars are over 200  Start using 20 units of Humalog at suppertime with an injection using U-200 insulin KwikPen  Given him information on the Omnipod pump to review and insurance coverage will be verified  He can likely be trained with fixed boluses on this pump and discussed how this would work  Most likely will be able to use the U-200 insulin in the pump Also with the Omnipod pump will be able to keep up with his bolus history better  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  Neuropathy: Advised him to take up to 300 mg extra per day as needed  Hypothyroidism: No change needed as TSH is quite good  Patient Instructions  In the morning do 6 clicks with the pump for breakfast and if blood sugar is over 675 take 8 clicks  At lunchtime take 8 clicks for covering the lungs  At suppertime take 20 units of HUMALOG with the pen along with 40 units of Lantus  Let us know if the blood sugars are starting to be below 100 consistently Make sure you change the pump every 24 hours  If your morning sugars stay consistently under 120 we can reduce the Lantus dose to 35 units      Counseling time on subjects discussed in assessment and plan sections is over 50% of today's 25 minute visit   Elayne Snare 09/19/2017, 1:50 PM   Note: This office note was prepared with Dragon voice recognition system technology. Any transcriptional errors that result from this process are unintentional.

## 2017-09-19 ENCOUNTER — Ambulatory Visit (INDEPENDENT_AMBULATORY_CARE_PROVIDER_SITE_OTHER): Payer: Medicare Other | Admitting: Endocrinology

## 2017-09-19 ENCOUNTER — Encounter: Payer: Self-pay | Admitting: Endocrinology

## 2017-09-19 VITALS — BP 122/82 | HR 70 | Ht 69.0 in | Wt 259.0 lb

## 2017-09-19 DIAGNOSIS — E1165 Type 2 diabetes mellitus with hyperglycemia: Secondary | ICD-10-CM

## 2017-09-19 DIAGNOSIS — E1142 Type 2 diabetes mellitus with diabetic polyneuropathy: Secondary | ICD-10-CM

## 2017-09-19 DIAGNOSIS — Z794 Long term (current) use of insulin: Secondary | ICD-10-CM | POA: Diagnosis not present

## 2017-09-19 DIAGNOSIS — I255 Ischemic cardiomyopathy: Secondary | ICD-10-CM | POA: Diagnosis not present

## 2017-09-19 DIAGNOSIS — E890 Postprocedural hypothyroidism: Secondary | ICD-10-CM | POA: Diagnosis not present

## 2017-09-19 LAB — POCT GLYCOSYLATED HEMOGLOBIN (HGB A1C): Hemoglobin A1C: 8.4 % — AB (ref 4.0–5.6)

## 2017-09-19 MED ORDER — INSULIN LISPRO 200 UNIT/ML ~~LOC~~ SOPN: 20.0000 [IU] | PEN_INJECTOR | Freq: Three times a day (TID) | SUBCUTANEOUS | 1 refills | Status: DC

## 2017-09-19 NOTE — Patient Instructions (Signed)
In the morning do 6 clicks with the pump for breakfast and if blood sugar is over 657 take 8 clicks  At lunchtime take 8 clicks for covering the lungs  At suppertime take 20 units of HUMALOG with the pen along with 40 units of Lantus  Let us know if the blood sugars are starting to be below 100 consistently Make sure you change the pump every 24 hours  If your morning sugars stay consistently under 120 we can reduce the Lantus dose to 35 units

## 2017-09-23 ENCOUNTER — Other Ambulatory Visit: Payer: Self-pay | Admitting: Family Medicine

## 2017-09-23 DIAGNOSIS — M109 Gout, unspecified: Secondary | ICD-10-CM

## 2017-09-23 MED ORDER — ALLOPURINOL 100 MG PO TABS: 150.0000 mg | ORAL_TABLET | Freq: Every day | ORAL | Status: DC

## 2017-09-24 DIAGNOSIS — R2689 Other abnormalities of gait and mobility: Secondary | ICD-10-CM | POA: Diagnosis not present

## 2017-09-24 DIAGNOSIS — M545 Low back pain: Secondary | ICD-10-CM | POA: Diagnosis not present

## 2017-09-24 DIAGNOSIS — R2681 Unsteadiness on feet: Secondary | ICD-10-CM | POA: Diagnosis not present

## 2017-09-27 DIAGNOSIS — R2689 Other abnormalities of gait and mobility: Secondary | ICD-10-CM | POA: Diagnosis not present

## 2017-09-27 DIAGNOSIS — M545 Low back pain: Secondary | ICD-10-CM | POA: Diagnosis not present

## 2017-09-27 DIAGNOSIS — R2681 Unsteadiness on feet: Secondary | ICD-10-CM | POA: Diagnosis not present

## 2017-10-01 ENCOUNTER — Encounter (HOSPITAL_COMMUNITY): Payer: Self-pay | Admitting: Emergency Medicine

## 2017-10-01 ENCOUNTER — Other Ambulatory Visit: Payer: Self-pay

## 2017-10-01 ENCOUNTER — Emergency Department (HOSPITAL_COMMUNITY): Payer: Medicare Other

## 2017-10-01 ENCOUNTER — Emergency Department (HOSPITAL_COMMUNITY)
Admission: EM | Admit: 2017-10-01 | Discharge: 2017-10-01 | Disposition: A | Discharge status: Home / Self Care | Payer: Medicare Other | Attending: Emergency Medicine | Admitting: Emergency Medicine

## 2017-10-01 DIAGNOSIS — I5022 Chronic systolic (congestive) heart failure: Secondary | ICD-10-CM | POA: Diagnosis not present

## 2017-10-01 DIAGNOSIS — Z794 Long term (current) use of insulin: Secondary | ICD-10-CM | POA: Diagnosis not present

## 2017-10-01 DIAGNOSIS — N183 Chronic kidney disease, stage 3 (moderate): Secondary | ICD-10-CM | POA: Diagnosis not present

## 2017-10-01 DIAGNOSIS — I2510 Atherosclerotic heart disease of native coronary artery without angina pectoris: Secondary | ICD-10-CM | POA: Diagnosis not present

## 2017-10-01 DIAGNOSIS — E1122 Type 2 diabetes mellitus with diabetic chronic kidney disease: Secondary | ICD-10-CM | POA: Insufficient documentation

## 2017-10-01 DIAGNOSIS — E039 Hypothyroidism, unspecified: Secondary | ICD-10-CM | POA: Diagnosis not present

## 2017-10-01 DIAGNOSIS — R2689 Other abnormalities of gait and mobility: Secondary | ICD-10-CM | POA: Diagnosis not present

## 2017-10-01 DIAGNOSIS — I252 Old myocardial infarction: Secondary | ICD-10-CM | POA: Insufficient documentation

## 2017-10-01 DIAGNOSIS — M25511 Pain in right shoulder: Secondary | ICD-10-CM | POA: Diagnosis not present

## 2017-10-01 DIAGNOSIS — Z91010 Allergy to peanuts: Secondary | ICD-10-CM | POA: Insufficient documentation

## 2017-10-01 DIAGNOSIS — W1830XA Fall on same level, unspecified, initial encounter: Secondary | ICD-10-CM | POA: Insufficient documentation

## 2017-10-01 DIAGNOSIS — I130 Hypertensive heart and chronic kidney disease with heart failure and stage 1 through stage 4 chronic kidney disease, or unspecified chronic kidney disease: Secondary | ICD-10-CM | POA: Diagnosis not present

## 2017-10-01 DIAGNOSIS — R2681 Unsteadiness on feet: Secondary | ICD-10-CM | POA: Diagnosis not present

## 2017-10-01 DIAGNOSIS — Z79899 Other long term (current) drug therapy: Secondary | ICD-10-CM | POA: Diagnosis not present

## 2017-10-01 DIAGNOSIS — G20 Parkinson's disease: Secondary | ICD-10-CM | POA: Diagnosis not present

## 2017-10-01 DIAGNOSIS — Z7982 Long term (current) use of aspirin: Secondary | ICD-10-CM | POA: Diagnosis not present

## 2017-10-01 DIAGNOSIS — Z95810 Presence of automatic (implantable) cardiac defibrillator: Secondary | ICD-10-CM | POA: Diagnosis not present

## 2017-10-01 DIAGNOSIS — Z951 Presence of aortocoronary bypass graft: Secondary | ICD-10-CM | POA: Insufficient documentation

## 2017-10-01 DIAGNOSIS — M545 Low back pain: Secondary | ICD-10-CM | POA: Diagnosis not present

## 2017-10-01 LAB — CBG MONITORING, ED
Glucose-Capillary: 198 mg/dL — ABNORMAL HIGH (ref 70–99)
Glucose-Capillary: 256 mg/dL — ABNORMAL HIGH (ref 70–99)

## 2017-10-01 LAB — BASIC METABOLIC PANEL
Anion gap: 12 (ref 5–15)
BUN: 33 mg/dL — AB (ref 8–23)
CALCIUM: 8.6 mg/dL — AB (ref 8.9–10.3)
CO2: 27 mmol/L (ref 22–32)
CREATININE: 1.76 mg/dL — AB (ref 0.61–1.24)
Chloride: 97 mmol/L — ABNORMAL LOW (ref 98–111)
GFR calc Af Amer: 45 mL/min — ABNORMAL LOW (ref >60)
GFR, EST NON AFRICAN AMERICAN: 39 mL/min — AB (ref >60)
GLUCOSE: 205 mg/dL — AB (ref 70–99)
POTASSIUM: 4.2 mmol/L (ref 3.5–5.1)
SODIUM: 136 mmol/L (ref 135–145)

## 2017-10-01 LAB — URINALYSIS, ROUTINE W REFLEX MICROSCOPIC
Bacteria, UA: NONE SEEN
Bilirubin Urine: NEGATIVE
GLUCOSE, UA: 50 mg/dL — AB
KETONES UR: NEGATIVE mg/dL
LEUKOCYTES UA: NEGATIVE
NITRITE: NEGATIVE
PH: 7 (ref 5.0–8.0)
PROTEIN: NEGATIVE mg/dL
Specific Gravity, Urine: 1.017 (ref 1.005–1.030)

## 2017-10-01 LAB — CBC
HCT: 36.9 % — ABNORMAL LOW (ref 39.0–52.0)
Hemoglobin: 11.7 g/dL — ABNORMAL LOW (ref 13.0–17.0)
MCH: 27.7 pg (ref 26.0–34.0)
MCHC: 31.7 g/dL (ref 30.0–36.0)
MCV: 87.4 fL (ref 78.0–100.0)
PLATELETS: 156 10*3/uL (ref 150–400)
RBC: 4.22 MIL/uL (ref 4.22–5.81)
RDW: 15.1 % (ref 11.5–15.5)
WBC: 7.8 10*3/uL (ref 4.0–10.5)

## 2017-10-01 NOTE — ED Notes (Signed)
Pt at xray

## 2017-10-01 NOTE — ED Provider Notes (Signed)
[place] EMERGENCY DEPARTMENT Provider Note   CSN: [id number] Arrival date & time: 10/01/17  1151     History   Chief Complaint Chief Complaint  Patient presents with  . Fall  . Shoulder Injury    HPI Henry [name] is a 67 y.o. male.  Patient with history of Parkinson's, CAD who presents to the ED after a fall 12 hours ago.  Patient was not using his walker and had a mechanical fall striking his right shoulder.  He has some right shoulder pain but otherwise has no symptoms.  Denies any chest pain, shortness of breath.  Has had increased frequency of falls over the last several weeks.  Goes outpatient physical therapy twice a week.  Does not have any infectious symptoms.  Patient did not hit his head, no loss consciousness.  The history is provided by the patient.  Fall  This is a chronic problem. The current episode started 12 to 24 hours ago. The problem occurs every several days. The problem has been resolved. Pertinent negatives include no chest pain, no abdominal pain, no headaches and no shortness of breath. Nothing aggravates the symptoms. Nothing relieves the symptoms. He has tried nothing for the symptoms. The treatment provided no relief.    Past Medical History:  Diagnosis Date  . AICD (automatic cardioverter/defibrillator) present    Dr Lovena Le office visit yearly, MDT   . Arthritis    cane  . Asthma   . Benign neoplasm of colon   . CAD (coronary artery disease)   . Cardiomyopathy   . Cataract   . Complication of anesthesia    pt states that he got a rash  . Constipation   . 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 DM (diabetes mellitus) ([place])    TYPE 2   . GERD (gastroesophageal reflux disease)   . Glaucoma    right eye  . HLD (hyperlipidemia)   . HTN (hypertension)    pt denies 08/19/12  . Hyperplasia, prostate   . Hyperthyroidism    thyroid lobectomy  . MI (myocardial infarction) [facility])    Dr Stanford Breed 2000,  x3vessels bypass  . Nephrolithiasis   . OSA (obstructive sleep apnea)    AHI-28,on CPAP, noncompliant with CPAP  . Parkinson disease ([place])    1999  . PONV (postoperative nausea and vomiting)   . Restless legs   . Shortness of breath    Hx: of at all times  . Sleep apnea    does not use CPAP  . Sleep apnea, organic   . UTI (lower urinary tract infection) 09/15/12   Klebsiella  . Ventral hernia     Patient Active Problem List   Diagnosis Date Noted  . Thrombocytopenia ([place]) 07/08/2017  . Gout, unspecified 06/06/2017  . Dysuria 05/25/2017  . Microscopic hematuria 05/25/2017  . CKD (chronic kidney disease) stage 3, GFR 30-59 ml/min (HCC) 04/12/2017  . Neuropathy 02/25/2017  . Urinary frequency 09/19/2016  . Constipation 11/27/2015  . Medicare annual wellness visit, initial 09/23/2015  . Advance care planning 09/23/2015  . Spinal stenosis of lumbar region 01/26/2015  . Restless leg syndrome 08/11/2014  . ICD- MDT, implanted 11/13/13 11/14/2013  . Chronic systolic CHF (congestive heart failure) ([place]) 11/13/2013  . Acute on chronic combined systolic and diastolic congestive heart failure, NYHA class 4 ([place]) 05/12/2013  . 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 Elevated PSA 12/04/2012  . Other malaise and fatigue 12/04/2012  .  PD (Parkinson's disease) ([place]) 08/19/2012  . OSA on CPAP 04/17/2012  . Dysautonomia ([place]) 04/17/2012  . Obesity, morbid ([place]) 04/17/2012  . Hypothyroidism following radioiodine therapy 10/16/2011  . DYSPNEA/SHORTNESS OF BREATH 11/09/2009  . GERD 05/11/2009  . Poorly controlled type 2 diabetes mellitus with circulatory disorder ([place]) 04/26/2009  . OTHER SPEC FORMS CHRONIC ISCHEMIC HEART DISEASE 04/24/2008  . HYPERPLASIA PROSTATE UNS W/O UR OBST & OTH LUTS 09/02/2007  . NEPHROLITHIASIS, HX OF 09/02/2007  . HERNIA, VENTRAL 12/28/2006  . Hyperlipidemia 08/31/2006  . HTN (hypertension) 08/31/2006  . Hx of CABG x 3 in 2000 08/31/2006    Past Surgical  History:  Procedure Laterality Date  . 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 CARDIAC CATHETERIZATION    . CATARACT EXTRACTION W/ INTRAOCULAR LENS IMPLANT     Hx: of right eye  . CATARACT EXTRACTION W/ INTRAOCULAR LENS IMPLANT Left 2018  . COLONOSCOPY N/A 10/13/2014   Procedure: COLONOSCOPY;  Surgeon: Jerene Bears, MD;  Location: [facility] ENDOSCOPY;  Service: Gastroenterology;  Laterality: N/A;  . COLONOSCOPY W/ BIOPSIES AND POLYPECTOMY     Hx: of  . CORONARY ARTERY BYPASS GRAFT  2000   [name], MD  . [place]  . DEEP BRAIN STIMULATOR PLACEMENT  2004   Right and left VIN stimulator placement (parkinsons)  . EYE SURGERY    . FINGER AMPUTATION     left pointer  . IMPLANTABLE CARDIOVERTER DEFIBRILLATOR IMPLANT N/A 11/13/2013   Procedure: IMPLANTABLE CARDIOVERTER DEFIBRILLATOR IMPLANT;  Surgeon: Evans Lance, MD;  Location: [facility] CATH LAB;  Service: Cardiovascular;  Laterality: N/A;  . INSERT / REPLACE / REMOVE PACEMAKER    . LEFT AND RIGHT HEART CATHETERIZATION WITH CORONARY ANGIOGRAM N/A 09/24/2013   Procedure: LEFT AND RIGHT HEART CATHETERIZATION WITH CORONARY ANGIOGRAM;  Surgeon: Burnell Blanks, MD;  Location: [facility] CATH LAB;  Service: Cardiovascular;  Laterality: N/A;  . LITHOTRIPSY     3 different times  . MEDIAN STERNOTOMY  2000  . SUBTHALAMIC STIMULATOR BATTERY REPLACEMENT N/A 09/05/2012   Procedure: Deep brain stimulator battery change;  Surgeon: Erline Levine, MD;  Location: [place]URO ORS;  Service: Neurosurgery;  Laterality: N/A;  Deep brain stimulator battery change  . SUBTHALAMIC STIMULATOR BATTERY REPLACEMENT N/A 06/10/2015   Procedure: Deep Brain stimulator battery change;  Surgeon: Erline Levine, MD;  Location: [facility] NEURO ORS;  Service: Neurosurgery;  Laterality: N/A;  . TONSILLECTOMY          Home Medications    Prior to Admission medications   Medication Sig Start Date End Date Taking? Authorizing Provider  acetaminophen (TYLENOL)  325 MG tablet Take 1-2 tablets (325-650 mg total) by mouth every 4 (four) hours as needed for mild pain. 11/14/13   Erlene Quan, PA-C  albuterol (PROVENTIL HFA;VENTOLIN HFA) 108 (90 Base) MCG/ACT inhaler Inhale 2 puffs into the lungs every 6 (six) hours as needed for wheezing or shortness of breath. 02/16/16   Lucille Passy, MD  allopurinol (ZYLOPRIM) 100 MG tablet Take 1.5 tablets (150 mg total) by mouth daily. 09/23/17   Tonia Ghent, MD  aspirin EC 81 MG tablet Take 81 mg by mouth daily.    [provider]  betamethasone dipropionate (DIPROLENE) 0.05 % cream Apply topically 2 (two) times daily as needed. Use sparingly. 08/24/17   Tonia Ghent, MD  carbidopa-levodopa (SINEMET IR) 25-100 MG tablet 2 in the morning, 2 in the afternoon, 1 in the evening 07/23/17  Tat, Eustace Quail, DO  clonazePAM (KLONOPIN) 0.5 MG tablet Take 1 tablet (0.5 mg total) by mouth at bedtime. 06/29/17   Tat, Eustace Quail, DO  colchicine 0.6 MG tablet TAKE 1 TABLET BY MOUTH TWICE A DAY AS NEEDED FOR GOUT 08/03/17   Tonia Ghent, MD  Fluticasone-Salmeterol (ADVAIR) 250-50 MCG/DOSE AEPB Inhale 1 puff into the lungs 2 (two) times daily. 06/29/17   Tonia Ghent, MD  furosemide (LASIX) 80 MG tablet TAKE 1 & 1/2 TABLETS DAILY 09/14/17   Tonia Ghent, MD  gabapentin (NEURONTIN) 100 MG capsule TAKE 1 TO 2 CAPSULES (100-200 MG TOTAL) BY MOUTH 3 (THREE) TIMES DAILY AS NEEDED. Patient taking differently: 100 mg at bedtime 05/29/17   Tonia Ghent, MD  Insulin Disposable Pump (V-GO 40) KIT Use one pod daily with insulin 02/20/17   Elayne Snare, MD  Insulin Glargine (LANTUS SOLOSTAR) 100 UNIT/ML Solostar Pen Inject 20 Units into the skin daily at 10 pm. Patient taking differently: Inject 40 Units into the skin daily at 10 pm.  09/17/17   Elayne Snare, MD  Insulin Lispro (HUMALOG KWIKPEN) 200 UNIT/ML SOPN Inject 20 Units into the skin 3 (three) times daily. 09/19/17   Elayne Snare, MD  insulin lispro (HUMALOG) 100 UNIT/ML  injection USE MAX 76 UNITS PER DAY   WITH V-GO PUMP 08/31/17   Elayne Snare, MD  Insulin Pen Needle (NOVOTWIST) 32G X 5 MM MISC USE AS DIRECTED EVERY DAY 04/10/17   Elayne Snare, MD  Insulin Pen Needle 32G X 4 MM MISC Used to give daily insulin injections. 06/25/17   Renato Shin, MD  KLOR-CON M20 20 MEQ tablet TAKE 2 TABLETS (40 MEQ TOTAL) BY MOUTH DAILY. 04/24/17   Tonia Ghent, MD  levothyroxine (SYNTHROID, LEVOTHROID) 125 MCG tablet TAKE 1 TABLET EVERY DAY DAILY BEFORE BREAKFAST 08/01/17   Elayne Snare, MD  lisinopril (PRINIVIL,ZESTRIL) 2.5 MG tablet TAKE 1 TABLET BY MOUTH DAILY. 03/05/17   Lelon Perla, MD  metolazone (ZAROXOLYN) 2.5 MG tablet TAKE 1 TABLET BY MOUTH EVERY DAY AS NEEDED FOR >3 LB IN A DAY OR >5 LB IN A WEEK 06/20/17   Strader, [place] M, PA-C  metoprolol succinate (TOPROL-XL) 50 MG 24 hr tablet TAKE 1 TABLET BY MOUTH DAILY. TAKE WITH OR IMMEDIATELY FOLLOWING A MEAL. 09/17/17   Tonia Ghent, MD  Misc. Devices (TRANSPORT CHAIR) MISC 1 Device by Does not apply route daily. 07/23/17   Tat, Eustace Quail, DO  Multiple Vitamin (MULTIVITAMIN WITH MINERALS) TABS tablet Take 1 tablet by mouth daily.     [provider]  MYRBETRIQ 50 MG TB24 tablet Take 50 mg by mouth daily. 01/24/17   [provider]  nitroGLYCERIN (NITROSTAT) 0.4 MG SL tablet Place 1 tablet (0.4 mg total) under the tongue every 5 (five) minutes as needed for chest pain. 05/13/13 06/09/15  Erlene Quan, PA-C  ONETOUCH DELICA LANCETS 37S MISC Use to check blood sugar 2 times per day dx code E11.49 09/27/15   Elayne Snare, MD  [facility] VERIO test strip USE AS DIRECTED TO CHECK BLOOD SUGAR TWICE A DAY 05/07/17   Elayne Snare, MD  rosuvastatin (CRESTOR) 20 MG tablet Take 1 tablet (20 mg total) daily by mouth. 11/23/16   Elayne Snare, MD  traMADol (ULTRAM) 50 MG tablet TAKE 1 TABLET BY MOUTH TWICE A DAY AS NEEDED 08/16/17   Ria Bush, MD  travoprost, benzalkonium, (TRAVATAN) 0.004 % ophthalmic solution Place 1  drop into the right eye at  bedtime.     [provider]  trihexyphenidyl (ARTANE) 2 MG tablet TAKE 1 TABLET BY MOUTH EVERY DAY 09/17/17   Tat, Eustace Quail, DO  VICTOZA 18 MG/3ML SOPN INJECT 1.'8MG'$  ONCE DAILY 06/22/17   Elayne Snare, MD  vitamin B-12 (CYANOCOBALAMIN) 1000 MCG tablet Take 1,000 mcg by mouth daily.    [provider]    Family History Family History  Problem Relation Age of Onset  . Aneurysm Mother   . Alcoholism Father   . HIV/AIDS Brother 66       AIDS  . Healthy Child   . Peripheral vascular disease Unknown   . Arthritis Unknown   . Healthy Child   . Healthy Child   . Diabetes Neg Hx   . Heart disease Neg Hx   . Colon cancer Neg Hx   . Prostate cancer Neg Hx     Social History Social History   Tobacco Use  . Smoking status: Never Smoker  . Smokeless tobacco: Never Used  Substance Use Topics  . Alcohol use: Yes    Comment: occasional beer  . Drug use: No     Allergies   Penicillins; Peanut-containing drug products; and Watermelon [citrullus vulgaris]   Review of Systems Review of Systems  Constitutional: Negative for chills and fever.  HENT: Negative for ear pain and sore throat.   Eyes: Negative for pain and visual disturbance.  Respiratory: Negative for cough and shortness of breath.   Cardiovascular: Negative for chest pain and palpitations.  Gastrointestinal: Negative for abdominal pain and vomiting.  Genitourinary: Negative for dysuria and hematuria.  Musculoskeletal: Positive for arthralgias and gait problem. Negative for back pain.  Skin: Negative for color change and rash.  Neurological: Negative for seizures, syncope and headaches.  All other systems reviewed and are negative.    Physical Exam Updated Vital Signs  ED Triage Vitals  Enc Vitals Group     BP 10/01/17 1155 127/79     Pulse Rate 10/01/17 1155 71     Resp 10/01/17 1155 18     Temp 10/01/17 1155 97.9 F (36.6 C)     Temp Source 10/01/17 1155 Oral      SpO2 10/01/17 1155 95 %     Weight 10/01/17 1155 260 lb (117.9 kg)     Height 10/01/17 1155 '5\' 9"'$  (1.753 m)     Head Circumference --      Peak Flow --      Pain Score 10/01/17 1205 8     Pain Loc --      Pain Edu? --      Excl. in [place]? --     Physical Exam  Constitutional: He is oriented to person, place, and time. He appears well-developed and well-nourished.  HENT:  Head: Normocephalic and atraumatic.  Eyes: Pupils are equal, round, and reactive to light. Conjunctivae and EOM are normal.  Neck: Normal range of motion. Neck supple.  Cardiovascular: Normal rate, regular rhythm, normal heart sounds and intact distal pulses.  No murmur heard. Pulmonary/Chest: Effort normal and breath sounds normal. No respiratory distress.  Abdominal: Soft. There is no tenderness.  Musculoskeletal: He exhibits tenderness (TTP to right shoulder). He exhibits no edema.  Neurological: He is alert and oriented to person, place, and time. No cranial nerve deficit or sensory deficit. He exhibits normal muscle tone. Coordination normal.  5+/5 strength, normal sensation  Skin: Skin is warm and dry.  Psychiatric: He has a normal mood and affect.  Nursing  note and vitals reviewed.    ED Treatments / Results  Labs (all labs ordered are listed, but only abnormal results are displayed) Labs Reviewed  BASIC METABOLIC PANEL - Abnormal; Notable for the following components:      Result Value   Chloride 97 (*)    Glucose, Bld 205 (*)    BUN 33 (*)    Creatinine, Ser 1.76 (*)    Calcium 8.6 (*)    GFR calc non Af Amer 39 (*)    GFR calc Af Amer 45 (*)    All other components within normal limits  CBC - Abnormal; Notable for the following components:   Hemoglobin 11.7 (*)    HCT 36.9 (*)    All other components within normal limits  URINALYSIS, ROUTINE W REFLEX MICROSCOPIC - Abnormal; Notable for the following components:   Glucose, UA 50 (*)    Hgb urine dipstick SMALL (*)    All other components  within normal limits  CBG MONITORING, ED - Abnormal; Notable for the following components:   Glucose-Capillary 198 (*)    All other components within normal limits  CBG MONITORING, ED - Abnormal; Notable for the following components:   Glucose-Capillary 256 (*)    All other components within normal limits    EKG EKG Interpretation  Date/Time:  Monday October 01 2017 12:15:05 EDT Ventricular Rate:  75 PR Interval:    QRS Duration: 74 QT Interval:  356 QTC Calculation: 397 R Axis:   99 Text Interpretation:  Accelerated Junctional rhythm Rightward axis Abnormal ECG TWI no longer evident in V4-V6 Confirmed by Lennice Sites [phone]) on 10/01/2017 2:50:00 PM   Radiology Dg Shoulder Right  Result Date: 10/01/2017 CLINICAL DATA:  Right shoulder pain. EXAM: RIGHT SHOULDER - 2+ VIEW COMPARISON:  Chest radiograph 11/14/2013 FINDINGS: Right shoulder is located without a fracture. Degenerative changes at the right [facility] joint. Degenerative changes at the glenohumeral joint. Stable electronic device overlying the right chest. IMPRESSION: No acute abnormality to the right shoulder. Degenerative disease. Electronically Signed   By: Markus Daft M.D.   On: 10/01/2017 15:41    Procedures Procedures (including critical care time)  Medications Ordered in ED Medications - No data to display   Initial Impression / Assessment and Plan / ED Course  I have reviewed the triage vital signs and the nursing notes.  Pertinent labs & imaging results that were available during my care of the patient were reviewed by me and considered in my medical decision making (see chart for details).     Henry [name] is a 67 year old male with history of CAD, Parkinson's who presents to the ED after mechanical fall.  Patient with unremarkable vitals.  No fever.  Patient with pain in the right arm after fall earlier this morning.  No loss of consciousness, no headaches.  Patient is neurologically intact.  Patient  ambulates with a walker but he states that he only had to go about 3 steps this morning and did not use his walker.  He states that he struck his right shoulder on the wall.  Patient has no abdominal pain, no shortness of breath, no chest pain.  Lab work done prior to my evaluation is overall unremarkable.  No significant electrolyte abnormality, leukocytosis, anemia.  EKG is sinus rhythm.  Improved T waves.  Doubt cardiac or pulmonary process.  Creatinine is at baseline.  Patient appears to be having more frequent falls secondary to weakness and Parkinson's likely.  Uses a  walker at home and also goes to physical therapy twice a week.  X-ray of the right shoulder was overall unremarkable.  Likely patient with bony contusion.  Recommend Tylenol for pain.  Recommend that they follow-up with primary care doctor about possibly home health or increased frequency of physical therapy.  Suspect progressive disease process ongoing with Parkinson's.  Discharged from ED in good condition and told to return to the ED if symptoms worsen.  This chart was dictated using voice recognition software.  Despite best efforts to proofread,  errors can occur which can change the documentation meaning.   Final Clinical Impressions(s) / ED Diagnoses   Final diagnoses:  Acute pain of right shoulder    ED Discharge Orders    None       Lennice Sites, DO 10/01/17 1553

## 2017-10-01 NOTE — ED Notes (Signed)
Wife states patient has Parkinson's and falls a lot. States he fell in the kitchen this am approx. 3am. C/o pain in right shoulder. Abrasions to left knee.

## 2017-10-01 NOTE — ED Notes (Signed)
Pt was noted to be unsteady at check in. Pt stated that he was "woozy feeling" and is attributing it to "lack of sleep".

## 2017-10-01 NOTE — ED Triage Notes (Addendum)
Pt has hx of parkinson's, is supposed to use a walker. He was walking to the bathroom without it and he fell, hit his right shoulder and left knee. Band aids noted to left knee. No other complaints, denies syncopal event or dizziness, however he does report his head "feels full and woozy at times, comes and goes ever since they changed my insulin last week."

## 2017-10-02 ENCOUNTER — Ambulatory Visit: Payer: Federal, State, Local not specified - PPO | Admitting: Orthotics

## 2017-10-03 ENCOUNTER — Ambulatory Visit: Payer: Federal, State, Local not specified - PPO | Admitting: Orthotics

## 2017-10-03 ENCOUNTER — Encounter: Payer: Medicare Other | Attending: Endocrinology | Admitting: Nutrition

## 2017-10-03 DIAGNOSIS — E1165 Type 2 diabetes mellitus with hyperglycemia: Secondary | ICD-10-CM

## 2017-10-04 ENCOUNTER — Ambulatory Visit (INDEPENDENT_AMBULATORY_CARE_PROVIDER_SITE_OTHER): Payer: Medicare Other | Admitting: Family Medicine

## 2017-10-04 ENCOUNTER — Encounter: Payer: Self-pay | Admitting: Family Medicine

## 2017-10-04 DIAGNOSIS — G4752 REM sleep behavior disorder: Secondary | ICD-10-CM | POA: Diagnosis not present

## 2017-10-04 DIAGNOSIS — G20 Parkinson's disease: Secondary | ICD-10-CM

## 2017-10-04 DIAGNOSIS — M25519 Pain in unspecified shoulder: Secondary | ICD-10-CM

## 2017-10-04 DIAGNOSIS — I255 Ischemic cardiomyopathy: Secondary | ICD-10-CM | POA: Diagnosis not present

## 2017-10-04 DIAGNOSIS — G629 Polyneuropathy, unspecified: Secondary | ICD-10-CM

## 2017-10-04 MED ORDER — GABAPENTIN 100 MG PO CAPS: ORAL_CAPSULE | ORAL | Status: DC

## 2017-10-04 MED ORDER — INSULIN GLARGINE 100 UNIT/ML SOLOSTAR PEN: 40.0000 [IU] | PEN_INJECTOR | Freq: Every day | SUBCUTANEOUS | Status: DC

## 2017-10-04 MED ORDER — GABAPENTIN 100 MG PO CAPS: 100.0000 mg | ORAL_CAPSULE | Freq: Two times a day (BID) | ORAL | Status: DC

## 2017-10-04 NOTE — Progress Notes (Signed)
Falls d/w pt. He is trying to get set with follow-up at the neurology clinic.  He is using a walker at baseline.  This is complicated by his right shoulder pain.  His sleep is affected by shoulder pain.  He didn't tolerate klonopin at night.  He has pain on shoulder range of motion.  Fall cautions discussed with patient.  He is also having B burning foot pain.  This is affecting his sleep.  We reviewed all of his medications, about when to take his medications.  He was concerned that he had left earwax buildup.  Check of left ear canal shows no cerumen.  Tympanic membrane is normal.  nad ncat Affect is slightly flat rrr ctab R shoulder pain on ROM with pain on internal and external rotation. abd soft Ext with trace BLE edema.   Recent xray with no acute abnormality to the right shoulder. Degenerative disease noted.  D/w pt.

## 2017-10-04 NOTE — Patient Instructions (Signed)
Try taking a higher dose of gabapentin.  1 tab in the AM and 2-3 tabs at night. See if that helps with the burning foot pain and see if you sleep better.  Update me as needed.  Take care.  Glad to see you.

## 2017-10-04 NOTE — Progress Notes (Signed)
Patient came into office expecting to start his insulin pump.  He was feeling sick to his stomach, and could not stay.  Also he was not put on the schedule to see Dr. Dwyane Dee this week.  Appointment was rescheduled for a later date.

## 2017-10-07 DIAGNOSIS — M25519 Pain in unspecified shoulder: Secondary | ICD-10-CM | POA: Insufficient documentation

## 2017-10-07 NOTE — Assessment & Plan Note (Signed)
Would try taking a higher dose of gabapentin.  1 tab in the AM and 2-3 tabs at night. He'll see if that helps with the burning foot pain and see if he sleeps better.  Update me as needed.

## 2017-10-07 NOTE — Assessment & Plan Note (Signed)
Per neurology.  Fall cautions discussed with patient.  Continue using walker.  Will ask for neurology to try to get the patient seen sooner than scheduled.

## 2017-10-07 NOTE — Assessment & Plan Note (Signed)
Imaging discussed with patient.  He likely has cuff irritation.  I would try pendulum exercises in the meantime to try to maintain as much range of motion as possible.

## 2017-10-07 NOTE — Assessment & Plan Note (Signed)
He did not tolerate Klonopin well.  Medication has been discontinued.

## 2017-10-08 DIAGNOSIS — M545 Low back pain: Secondary | ICD-10-CM | POA: Diagnosis not present

## 2017-10-08 DIAGNOSIS — R2681 Unsteadiness on feet: Secondary | ICD-10-CM | POA: Diagnosis not present

## 2017-10-08 DIAGNOSIS — R2689 Other abnormalities of gait and mobility: Secondary | ICD-10-CM | POA: Diagnosis not present

## 2017-10-09 ENCOUNTER — Telehealth: Payer: Self-pay | Admitting: Podiatry

## 2017-10-09 NOTE — Telephone Encounter (Signed)
Left message for pt that Dr Dwyane Dee is saying pt needs to schedule an appt with him for an updated foot exam before he will sign off on diabetic shoe paperwork. I asked pt to call me when he is scheduled and I can resend paperwork to Dr Dwyane Dee if needed.

## 2017-10-10 ENCOUNTER — Telehealth: Payer: Self-pay | Admitting: Endocrinology

## 2017-10-10 NOTE — Telephone Encounter (Signed)
Patient is stating they are taking insulin lispro (HUMALOG) 100 UNIT/ML injection. He is unsure about how many clicks to use when blood sugar is low. Please advise with patient, thanks Ph # [phone]

## 2017-10-10 NOTE — Telephone Encounter (Signed)
2nd request for call back today regarding the following: "Patient is stating they are taking insulin lispro (HUMALOG) 100 UNIT/ML injection. He is unsure about how many clicks to use when blood sugar is low. Please advise with patient, thanks Ph # [phone]"

## 2017-10-10 NOTE — Telephone Encounter (Signed)
Please advise 

## 2017-10-10 NOTE — Telephone Encounter (Signed)
lft pt vm with instructions 

## 2017-10-10 NOTE — Telephone Encounter (Signed)
If the blood sugar is between 80-120 he will collect one less time than what he normally takes.  Over 120 he will take the same number of clicks.  Not clear if this is what he is asking about

## 2017-10-11 ENCOUNTER — Encounter: Payer: Self-pay | Admitting: Family Medicine

## 2017-10-11 ENCOUNTER — Ambulatory Visit (INDEPENDENT_AMBULATORY_CARE_PROVIDER_SITE_OTHER): Payer: Medicare Other | Admitting: Family Medicine

## 2017-10-11 VITALS — BP 98/60 | HR 75 | Temp 98.5°F | Ht 69.0 in | Wt 258.5 lb

## 2017-10-11 DIAGNOSIS — I255 Ischemic cardiomyopathy: Secondary | ICD-10-CM

## 2017-10-11 DIAGNOSIS — N183 Chronic kidney disease, stage 3 unspecified: Secondary | ICD-10-CM

## 2017-10-11 DIAGNOSIS — K5900 Constipation, unspecified: Secondary | ICD-10-CM

## 2017-10-11 DIAGNOSIS — M545 Low back pain: Secondary | ICD-10-CM | POA: Diagnosis not present

## 2017-10-11 DIAGNOSIS — R2681 Unsteadiness on feet: Secondary | ICD-10-CM | POA: Diagnosis not present

## 2017-10-11 DIAGNOSIS — I10 Essential (primary) hypertension: Secondary | ICD-10-CM | POA: Diagnosis not present

## 2017-10-11 DIAGNOSIS — G629 Polyneuropathy, unspecified: Secondary | ICD-10-CM

## 2017-10-11 DIAGNOSIS — R2689 Other abnormalities of gait and mobility: Secondary | ICD-10-CM | POA: Diagnosis not present

## 2017-10-11 NOTE — Patient Instructions (Addendum)
Go to the lab on the way out.  We'll contact you with your lab report. Use miralax if needed.  Take care.  Glad to see you.  Only use the metolazone if your weight is up.

## 2017-10-11 NOTE — Telephone Encounter (Signed)
Pts wife called and left message that pt is scheduled to see Dr Dwyane Dee on 10.14.19.

## 2017-10-11 NOTE — Telephone Encounter (Signed)
Handled in separate encounter.

## 2017-10-11 NOTE — Progress Notes (Signed)
Sleeping better with higher dose of gabapentin, with less burning pain in the feet at night.  But waking with cramping in the toes, in the middle of the night.  This was after taking an extra metolazone for 4 consecutive days.  The extra doses helped with the BLE edema.  He is down 9 lbs from the last OV.  D/w pt about using metolazone only as needed.    His R shoulder ROM is clearly better today compared to prev.  He has some "pulling" near the top of the R shoulder, near the superior portion of the glenohumeral joint.   He has history of constipation.  Discussed options.  I advised him to avoid citrate of magnesia if possible.  Likely safer and more reasonable to use MiraLAX as needed.  Discussed.  More pain with int vs ext rotation.  Icing clearly helps.    Meds, vitals, and allergies reviewed.   ROS: Per HPI unless specifically indicated in ROS section   GEN: nad, alert and oriented, affect slightly flat at baseline. HEENT: mucous membranes moist NECK: supple w/o LA CV: rrr PULM: ctab, no inc wob ABD: soft, +bs EXT: trace BLE edema SKIN: no acute rash  R shoulder with more pain with int vs ext rotation but range of motion and pain are both improved from previous.

## 2017-10-12 ENCOUNTER — Other Ambulatory Visit: Payer: Self-pay | Admitting: Family Medicine

## 2017-10-12 LAB — BASIC METABOLIC PANEL
BUN: 58 mg/dL — ABNORMAL HIGH (ref 6–23)
CHLORIDE: 93 meq/L — AB (ref 96–112)
CO2: 34 meq/L — AB (ref 19–32)
Calcium: 9 mg/dL (ref 8.4–10.5)
Creatinine, Ser: 2.28 mg/dL — ABNORMAL HIGH (ref 0.40–1.50)
GFR: 30.68 mL/min — ABNORMAL LOW (ref >60.00)
Glucose, Bld: 76 mg/dL (ref 70–99)
Potassium: 3.6 mEq/L (ref 3.5–5.1)
SODIUM: 137 meq/L (ref 135–145)

## 2017-10-12 NOTE — Telephone Encounter (Signed)
Electronic refill request. Gabapentin Last office visit:   10/11/17 Last Filled:   05/29/17  #90 Please advise.

## 2017-10-14 ENCOUNTER — Other Ambulatory Visit: Payer: Self-pay | Admitting: Family Medicine

## 2017-10-14 DIAGNOSIS — I10 Essential (primary) hypertension: Secondary | ICD-10-CM

## 2017-10-14 NOTE — Assessment & Plan Note (Signed)
He had some nocturnal cramping that was likely related to overdiuresis with consecutive dosing of metolazone.  Discussed with him about as needed use of metolazone, if he had increase in weight.  Recheck routine labs today.  See notes on labs. >25 minutes spent in face to face time with patient, >50% spent in counselling or coordination of care.

## 2017-10-14 NOTE — Assessment & Plan Note (Signed)
He has history of constipation.  Discussed options.  I advised him to avoid citrate of magnesia if possible.  Likely safer and more reasonable to use MiraLAX as needed.  Discussed.

## 2017-10-14 NOTE — Telephone Encounter (Signed)
Sent. Thanks.   

## 2017-10-14 NOTE — Assessment & Plan Note (Signed)
Improved on higher dose of gabapentin.  Continue as is.  He agrees.

## 2017-10-15 ENCOUNTER — Encounter: Payer: Self-pay | Admitting: Family Medicine

## 2017-10-15 ENCOUNTER — Telehealth: Payer: Self-pay | Admitting: *Deleted

## 2017-10-15 NOTE — Telephone Encounter (Signed)
Have him try dulcolax or senokot in the meantime.  I would try to avoid mag citrate. Thanks.

## 2017-10-15 NOTE — Telephone Encounter (Signed)
Patient says he still is suffering from constipation and does not feel that he can go another night without some relief.  Patient is taking prune juice, applesauce, flax seed, in addition to Miralax.  Patient also is asking for something to help him rest at night.  Appointment tentative scheduled for 10/16/17 at 9:30 for these problems in addtion to repeat labs unless he has to go to ER before.

## 2017-10-15 NOTE — Telephone Encounter (Signed)
Patient advised.

## 2017-10-16 ENCOUNTER — Ambulatory Visit (INDEPENDENT_AMBULATORY_CARE_PROVIDER_SITE_OTHER): Payer: Medicare Other | Admitting: Family Medicine

## 2017-10-16 ENCOUNTER — Encounter: Payer: Self-pay | Admitting: Family Medicine

## 2017-10-16 VITALS — BP 104/64 | HR 79 | Temp 98.0°F | Ht 69.0 in | Wt 259.2 lb

## 2017-10-16 DIAGNOSIS — E1165 Type 2 diabetes mellitus with hyperglycemia: Secondary | ICD-10-CM | POA: Diagnosis not present

## 2017-10-16 DIAGNOSIS — I255 Ischemic cardiomyopathy: Secondary | ICD-10-CM | POA: Diagnosis not present

## 2017-10-16 DIAGNOSIS — E1159 Type 2 diabetes mellitus with other circulatory complications: Secondary | ICD-10-CM | POA: Diagnosis not present

## 2017-10-16 DIAGNOSIS — G629 Polyneuropathy, unspecified: Secondary | ICD-10-CM

## 2017-10-16 DIAGNOSIS — I10 Essential (primary) hypertension: Secondary | ICD-10-CM | POA: Diagnosis not present

## 2017-10-16 DIAGNOSIS — N183 Chronic kidney disease, stage 3 unspecified: Secondary | ICD-10-CM

## 2017-10-16 DIAGNOSIS — K5900 Constipation, unspecified: Secondary | ICD-10-CM

## 2017-10-16 LAB — BASIC METABOLIC PANEL
BUN: 43 mg/dL — AB (ref 6–23)
CO2: 32 mEq/L (ref 19–32)
CREATININE: 1.91 mg/dL — AB (ref 0.40–1.50)
Calcium: 9.1 mg/dL (ref 8.4–10.5)
Chloride: 94 mEq/L — ABNORMAL LOW (ref 96–112)
GFR: 37.63 mL/min — AB (ref >60.00)
GLUCOSE: 123 mg/dL — AB (ref 70–99)
Potassium: 3.8 mEq/L (ref 3.5–5.1)
Sodium: 135 mEq/L (ref 135–145)

## 2017-10-16 NOTE — Patient Instructions (Addendum)
Go to the lab on the way out.  We'll contact you with your lab report. If you have any more low sugars before the endocrine appointment, then let them know.   If no BM today, then you can try magnesium citrate once.   Try taking 3 of the gabapentin at night. See if that helps.  Let me know if needed.   Take care.  Glad to see you.

## 2017-10-16 NOTE — Progress Notes (Signed)
F/u BMET pending.  Weight is stable.  No metolazone use since last OV.  Still taking lasix 120mg  a day.  He has f/u with renal clinic next week, [facility].    His sugar dropped to 51 last night.  He has endo f/u pending.  See avs, d/w pt.   He still has constipation.  Last BM was about 5 days ago.  He is using miralax and prunes/etc. No relief with that so far.  He is still passing gas.   Trouble sleeping from foot pain.  Going to sleep okay but waking from foot sx.  Cramping is better, off metolazone.  Taking 2 gabapentin at night.    Meds, vitals, and allergies reviewed.   ROS: Per HPI unless specifically indicated in ROS section   GEN: nad, alert and oriented, in wheelchair, flat affect at baseline.  Speech is at baseline. HEENT: mucous membranes moist NECK: supple w/o LA CV: rrr. PULM: ctab, no inc wob ABD: soft, +bs EXT: no edema SKIN: no acute rash

## 2017-10-18 NOTE — Assessment & Plan Note (Signed)
He is tried multiple medications and still having difficulty with constipation.  Discussed with patient about trial of magnesium citrate and once if he does not have any relief with his current medications.

## 2017-10-18 NOTE — Assessment & Plan Note (Signed)
Reasonable to increase gabapentin to 3 tablets at night and see if that helps.  Update me as needed.  He agrees.

## 2017-10-18 NOTE — Assessment & Plan Note (Signed)
Recheck labs today.  We will update the kidney clinic about his results.  His cramping is better since he cut back on excessive metolazone use.  Weight is stable.  Continue current medications as is for now otherwise.

## 2017-10-18 NOTE — Assessment & Plan Note (Signed)
See after visit summary.  If he has any more lows then I want him to contact the endocrinology office prior to next visit.  Routine cautions d/w pt.

## 2017-10-19 ENCOUNTER — Other Ambulatory Visit: Payer: Self-pay | Admitting: Family Medicine

## 2017-10-19 NOTE — Telephone Encounter (Signed)
Electronic refill request. Gabapentin Last office visit:   10/16/2017 Last Filled:    360 capsule 1 10/14/2017    Sig: 1 tab in the AM and 2-3 tabs at night  Please advise.

## 2017-10-19 NOTE — Telephone Encounter (Signed)
Last Rx 8/88/19 # 39 1R.. Last OV 10/2017

## 2017-10-21 NOTE — Telephone Encounter (Signed)
Sent. Thanks.   

## 2017-10-22 ENCOUNTER — Ambulatory Visit (INDEPENDENT_AMBULATORY_CARE_PROVIDER_SITE_OTHER): Payer: Medicare Other | Admitting: Endocrinology

## 2017-10-22 ENCOUNTER — Telehealth: Payer: Self-pay

## 2017-10-22 ENCOUNTER — Encounter: Payer: Self-pay | Admitting: Endocrinology

## 2017-10-22 VITALS — BP 114/64 | HR 84 | Ht 69.0 in | Wt 255.0 lb

## 2017-10-22 DIAGNOSIS — E1165 Type 2 diabetes mellitus with hyperglycemia: Secondary | ICD-10-CM

## 2017-10-22 DIAGNOSIS — I255 Ischemic cardiomyopathy: Secondary | ICD-10-CM

## 2017-10-22 DIAGNOSIS — Z794 Long term (current) use of insulin: Secondary | ICD-10-CM

## 2017-10-22 NOTE — Patient Instructions (Signed)
Stop Lantus If your morning sugars on waking up stay over 150 start taking 10 units Lantus at night daily  May take 2 clicks for eating cereal or other high carbohydrate foods otherwise no need for clicks unless blood sugars are going up over 180 with any type of meal

## 2017-10-22 NOTE — Progress Notes (Signed)
Patient ID: Henry [name], male   DOB: 1950/07/28, 67 y.o.   MRN: [id number]           Reason for Appointment: Follow-up for Type 2 Diabetes   History of Present Illness:          Diagnosis: Type 2 diabetes mellitus, date of diagnosis: 2000      Past history:   Patient thinks he has been taking Amaryl for several years and probably metformin since onset also At some point he was changed from metformin to [facility] and was taking this since at least 2012 A1c had been higher in 2015 Since his A1c had been progressively higher with his regimen of Janumet and Amaryl he was started on Victoza in 01/2014 He was started on Lantus insulin in 5/16 because of persistent hyperglycemia especially fasting; was having readings as high as 293 Because of tendency to high postprandial readings and for control with Lantus he was switched to the V-go pump in  09/2014  Recent history:   INSULIN regimen: V-go-40 pump and boluses 4 units.  Lantus 20units at bedtime  Non-insulin hypoglycemic drugs the patient is taking are: Metformin 1 g a day, Victoza 1.8 mg daily in am   His A1c was  8.4  Current management, blood sugar patterns and problems identified:  For the last 2 to 3 weeks he has radically changed his diet and has cut back on sweets, snacks and portions especially of carbohydrate  With this his blood sugars are averaging about 100 mg lower than before  He has continued to use extra Lantus at night but no more than 20 units, previously as much is 40 units extra  Also he he is requiring large doses of insulin now and equivalent of 80 units of basal insulin  He says he is taking 40 units Lantus most of th not doing any boluses usually except when his blood sugars are higher  At the same time he is checking his blood sugars extremity frequently with monitoring nearly 5 times a day  Blood sugar patterns are fairly consistent with most blood sugars within the target range recently in the last 2  weeks or so  Also not even without much bolus insulin his postprandial readings going up as much, last night it was 137 after eating a small piece of cake and a small sandwich  However blood sugars tend to go up after eating cereal in the morning  Weight is about the same  Has had occasional hypoglycemia with blood sugars as low as 51 and he will then treat this with candy  He has seen the dietitian in 3/16  Not able to exercise because of back pain and musculoskeletal issues and balance       Side effects from medications have been: None Compliance with the medical regimen: Fair  Hypoglycemia:   none  Glucose monitoring:  done 4-5 times  a day         Glucometer: One Touch ultra 2 .      Blood Glucose readings by download of glucose monitor  PRE-MEAL Fasting Lunch Dinner  overnight Overall  Glucose range:  80-307    64-198   Mean/median:  131  111  136  104 125   POST-MEAL PC Breakfast PC Lunch PC Dinner  Glucose range:     Mean/median:   146           Meals: 9 am , 2-3 pm and 6-7 pm  Dietician visit, most recent: 4/19               Weight history:   Wt Readings from Last 3 Encounters:  10/22/17 255 lb (115.7 kg)  10/16/17 259 lb 4 oz (117.6 kg)  10/11/17 258 lb 8 oz (117.3 kg)    Glycemic control:   Lab Results  Component Value Date   HGBA1C 8.4 (A) 09/19/2017   HGBA1C 9.6 05/17/2017   HGBA1C 8.3 (H) 02/05/2017   Lab Results  Component Value Date   MICROALBUR <0.7 02/05/2017   LDLCALC 118 (H) 09/16/2015   CREATININE 1.91 (H) 10/16/2017    Lab Results  Component Value Date   FRUCTOSAMINE 350 (H) 03/29/2017   FRUCTOSAMINE 250 09/16/2015   FRUCTOSAMINE 259 12/15/2014     Other active problems: See review of systems      Allergies as of 10/22/2017      Reactions   Penicillins Rash, Other (See Comments)   WEARS ALLERGY BRACELET Because of a history of documented adverse serious drug reaction;Medi Alert bracelet  is recommended Has  patient had a PCN reaction causing immediate rash, facial/tongue/throat swelling, SOB or lightheadedness with hypotension: Yes Has patient had a PCN reaction causing severe rash involving mucus membranes or skin necrosis: unknown Has patient had a PCN reaction that required hospitalization NO Has patient had a PCN reaction occurring within the last 10 years: NO If all of    Klonopin [clonazepam]    agitation   Peanut-containing Drug Products Other (See Comments)   cough   Watermelon [citrullus Vulgaris]    Tickle in throat and cough      Medication List        Accurate as of 10/22/17  1:54 PM. Always use your most recent med list.          acetaminophen 325 MG tablet Commonly known as:  TYLENOL Take 1-2 tablets (325-650 mg total) by mouth every 4 (four) hours as needed for mild pain.   albuterol 108 (90 Base) MCG/ACT inhaler Commonly known as:  PROVENTIL HFA;VENTOLIN HFA Inhale 2 puffs into the lungs every 6 (six) hours as needed for wheezing or shortness of breath.   allopurinol 100 MG tablet Commonly known as:  ZYLOPRIM Take 1.5 tablets (150 mg total) by mouth daily.   aspirin EC 81 MG tablet Take 81 mg by mouth daily.   betamethasone dipropionate 0.05 % cream Commonly known as:  DIPROLENE Apply topically 2 (two) times daily as needed. Use sparingly.   carbidopa-levodopa 25-100 MG tablet Commonly known as:  SINEMET IR 2 in the morning, 2 in the afternoon, 1 in the evening   colchicine 0.6 MG tablet TAKE 1 TABLET BY MOUTH TWICE A DAY AS NEEDED FOR GOUT   Fluticasone-Salmeterol 250-50 MCG/DOSE Aepb Commonly known as:  ADVAIR Inhale 1 puff into the lungs 2 (two) times daily.   furosemide 80 MG tablet Commonly known as:  LASIX TAKE 1 & 1/2 TABLETS DAILY   gabapentin 100 MG capsule Commonly known as:  NEURONTIN 1 tab in the AM and 2-3 tabs at night.   Insulin Glargine 100 UNIT/ML Solostar Pen Commonly known as:  LANTUS Inject 40 Units into the skin daily at  10 pm.   insulin lispro 100 UNIT/ML injection Commonly known as:  HUMALOG USE MAX 76 UNITS PER DAY   WITH V-GO PUMP   Insulin Lispro 200 UNIT/ML Sopn Inject 20 Units into the skin 3 (three) times daily.   Insulin Pen Needle 32G X  5 MM Misc USE AS DIRECTED EVERY DAY   Insulin Pen Needle 32G X 4 MM Misc Used to give daily insulin injections.   KLOR-CON M20 20 MEQ tablet Generic drug:  potassium chloride SA TAKE 2 TABLETS (40 MEQ TOTAL) BY MOUTH DAILY.   levothyroxine 125 MCG tablet Commonly known as:  SYNTHROID, LEVOTHROID TAKE 1 TABLET EVERY DAY DAILY BEFORE BREAKFAST   lisinopril 2.5 MG tablet Commonly known as:  PRINIVIL,ZESTRIL TAKE 1 TABLET BY MOUTH DAILY.   metolazone 2.5 MG tablet Commonly known as:  ZAROXOLYN TAKE 1 TABLET BY MOUTH EVERY DAY AS NEEDED FOR >3 LB IN A DAY OR >5 LB IN A WEEK   metoprolol succinate 50 MG 24 hr tablet Commonly known as:  TOPROL-XL TAKE 1 TABLET BY MOUTH DAILY. TAKE WITH OR IMMEDIATELY FOLLOWING A MEAL.   multivitamin with minerals Tabs tablet Take 1 tablet by mouth daily.   MYRBETRIQ 50 MG Tb24 tablet Generic drug:  mirabegron ER Take 50 mg by mouth daily.   nitroGLYCERIN 0.4 MG SL tablet Commonly known as:  NITROSTAT Place 1 tablet (0.4 mg total) under the tongue every 5 (five) minutes as needed for chest pain.   ONETOUCH DELICA LANCETS 62H Misc Use to check blood sugar 2 times per day dx code E11.49   ONETOUCH VERIO test strip Generic drug:  glucose blood USE AS DIRECTED TO CHECK BLOOD SUGAR TWICE A DAY   polyethylene glycol packet Commonly known as:  MIRALAX / GLYCOLAX Take 17 g by mouth daily as needed for moderate constipation.   rosuvastatin 20 MG tablet Commonly known as:  CRESTOR Take 1 tablet (20 mg total) daily by mouth.   traMADol 50 MG tablet Commonly known as:  ULTRAM TAKE 1 TABLET BY MOUTH TWICE A DAY AS NEEDED   Transport Chair Misc 1 Device by Does not apply route daily.   travoprost (benzalkonium)  0.004 % ophthalmic solution Commonly known as:  TRAVATAN Place 1 drop into the right eye at bedtime.   V-GO 40 Kit Use one pod daily with insulin   VICTOZA 18 MG/3ML Sopn Generic drug:  liraglutide INJECT 1.8MG ONCE DAILY   vitamin B-12 1000 MCG tablet Commonly known as:  CYANOCOBALAMIN Take 1,000 mcg by mouth daily.       Allergies:  Allergies  Allergen Reactions  . Penicillins Rash and Other (See Comments)    WEARS ALLERGY BRACELET Because of a history of documented adverse serious drug reaction;Medi Alert bracelet  is recommended Has patient had a PCN reaction causing immediate rash, facial/tongue/throat swelling, SOB or lightheadedness with hypotension: Yes Has patient had a PCN reaction causing severe rash involving mucus membranes or skin necrosis: unknown Has patient had a PCN reaction that required hospitalization NO Has patient had a PCN reaction occurring within the last 10 years: NO If all of   . Klonopin [Clonazepam]     agitation  . Peanut-Containing Drug Products Other (See Comments)    cough  . Watermelon [Citrullus Vulgaris]     Tickle in throat and cough    Past Medical History:  Diagnosis Date  . AICD (automatic cardioverter/defibrillator) present    Dr Lovena Le office visit yearly, MDT   . Arthritis    cane  . Asthma   . Benign neoplasm of colon   . CAD (coronary artery disease)   . Cardiomyopathy   . Cataract   . Complication of anesthesia    pt states that he got a rash  . Constipation   .  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 DM (diabetes mellitus) ([place])    TYPE 2   . GERD (gastroesophageal reflux disease)   . Glaucoma    right eye  . HLD (hyperlipidemia)   . HTN (hypertension)    pt denies 08/19/12  . Hyperplasia, prostate   . Hyperthyroidism    thyroid lobectomy  . MI (myocardial infarction) [facility])    Dr Stanford Breed 2000, x3vessels bypass  . Nephrolithiasis   . OSA (obstructive sleep apnea)    AHI-28,on CPAP,  noncompliant with CPAP  . Parkinson disease ([place])    1999  . PONV (postoperative nausea and vomiting)   . Restless legs   . Shortness of breath    Hx: of at all times  . Sleep apnea    does not use CPAP  . Sleep apnea, organic   . UTI (lower urinary tract infection) 09/15/12   Klebsiella  . Ventral hernia     Past Surgical History:  Procedure Laterality Date  . 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 CARDIAC CATHETERIZATION    . CATARACT EXTRACTION W/ INTRAOCULAR LENS IMPLANT     Hx: of right eye  . CATARACT EXTRACTION W/ INTRAOCULAR LENS IMPLANT Left 2018  . COLONOSCOPY N/A 10/13/2014   Procedure: COLONOSCOPY;  Surgeon: Jerene Bears, MD;  Location: [facility] ENDOSCOPY;  Service: Gastroenterology;  Laterality: N/A;  . COLONOSCOPY W/ BIOPSIES AND POLYPECTOMY     Hx: of  . CORONARY ARTERY BYPASS GRAFT  2000   [name], MD  . [place]  . DEEP BRAIN STIMULATOR PLACEMENT  2004   Right and left VIN stimulator placement (parkinsons)  . EYE SURGERY    . FINGER AMPUTATION     left pointer  . IMPLANTABLE CARDIOVERTER DEFIBRILLATOR IMPLANT N/A 11/13/2013   Procedure: IMPLANTABLE CARDIOVERTER DEFIBRILLATOR IMPLANT;  Surgeon: Evans Lance, MD;  Location: [facility] CATH LAB;  Service: Cardiovascular;  Laterality: N/A;  . INSERT / REPLACE / REMOVE PACEMAKER    . LEFT AND RIGHT HEART CATHETERIZATION WITH CORONARY ANGIOGRAM N/A 09/24/2013   Procedure: LEFT AND RIGHT HEART CATHETERIZATION WITH CORONARY ANGIOGRAM;  Surgeon: Burnell Blanks, MD;  Location: [facility] CATH LAB;  Service: Cardiovascular;  Laterality: N/A;  . LITHOTRIPSY     3 different times  . MEDIAN STERNOTOMY  2000  . SUBTHALAMIC STIMULATOR BATTERY REPLACEMENT N/A 09/05/2012   Procedure: Deep brain stimulator battery change;  Surgeon: Erline Levine, MD;  Location: [place] [facility]O [facility]S;  Service: Neurosurgery;  Laterality: N/A;  Deep brain stimulator battery change  . SUBTHALAMIC STIMULATOR BATTERY REPLACEMENT  N/A 06/10/2015   Procedure: Deep Brain stimulator battery change;  Surgeon: Erline Levine, MD;  Location: [place] NEURO ORS;  Service: Neurosurgery;  Laterality: N/A;  . TONSILLECTOMY      Family History  Problem Relation Age of Onset  . Aneurysm Mother   . Alcoholism Father   . HIV/AIDS Brother 27       AIDS  . Healthy Child   . Peripheral vascular disease Unknown   . Arthritis Unknown   . Healthy Child   . Healthy Child   . Diabetes Neg Hx   . Heart disease Neg Hx   . Colon cancer Neg Hx   . Prostate cancer Neg Hx     Social History:  reports that he has never smoked. He has never used smokeless tobacco. He reports that he drinks alcohol. He reports that  he does not use drugs.    Review of Systems     Most recent eye exam was done in 04/2017    HYPERLIPIDEMIA:  He is on Crestor 20 mg with good control of LDL, has low HDL Triglycerides are persistently over 200, he is on Lovaza       Lab Results  Component Value Date   CHOL 123 04/04/2017   CHOL 111 10/16/2016   CHOL 132 12/06/2015   Lab Results  Component Value Date   HDL 32.30 (L) 04/04/2017   HDL 27.80 (L) 10/16/2016   HDL 30.20 (L) 12/06/2015   Lab Results  Component Value Date   LDLCALC 118 (H) 09/16/2015   LDLCALC 126 (H) 04/08/2013   LDLCALC 51 02/28/2012   Lab Results  Component Value Date   TRIG 232.0 (H) 04/04/2017   TRIG 287.0 (H) 10/16/2016   TRIG 217.0 (H) 12/06/2015   Lab Results  Component Value Date   CHOLHDL 4 04/04/2017   CHOLHDL 4 10/16/2016   CHOLHDL 4 12/06/2015   Lab Results  Component Value Date   LDLDIRECT 61.0 04/04/2017   LDLDIRECT 52.0 10/16/2016   LDLDIRECT 69.0 12/06/2015                  Thyroid:   He has had hypothyroidism following radioactive iodine treatment for hyperthyroidism in 2013  TSH is usually normal with  dosage of 125 g, slightly higher in 5/19   Lab Results  Component Value Date   TSH 4.57 (H) 05/17/2017   TSH 3.62 02/05/2017   TSH 2.35 03/29/2016     FREET4 0.74 02/05/2017   FREET4 0.82 08/20/2014   FREET4 0.98 11/20/2011       HYPERTENSION has been followed by  other physicians   CKD: Has variable creatinine levels, usually consistently high   Lab Results  Component Value Date   CREATININE 1.91 (H) 10/16/2017   CREATININE 2.28 (H) 10/11/2017   CREATININE 1.76 (H) 10/01/2017    Neuropathy: Taking gabapentin as needed  Diabetic foot exam in 04/2017 showed  normal monofilament sensation in the toes except decreased on the right great toe and plantar surfaces, no skin lesions or ulcers on the feet and normal pedal pulses    Physical Examination:  BP 114/64   Pulse 84   Ht _0  (1.753 m)   Wt 255 lb (115.7 kg)   SpO2 94%   BMI 37.66 kg/m      ASSESSMENT/PLAN  Diabetes type 2, insulin-dependent with obesity     See history of present illness for detailed discussion of his current management, blood sugar patterns and problems identified  His A1c last was 8.4  As discussed above he has recently started having much better control than usual with changing his diet significantly and getting much more motivated to watch his diabetes control With this he is requiring minimal amounts of bolus insulin and blood sugars are only averaging 125 with occasional hypoglycemia also Is having better insulin sensitivity with his blood sugars coming down and less glucose toxicity Also on Victoza Currently checking blood sugars numerous times a day and night also   Recommendations:   He was given information on the freestyle libre sensor as he is able to qualify for this now and discussed in detail how this would be used DME supplier list given  Have instructions given as needed  He will also be trained for the Omnipod insulin pump next month  In the meantime he needs to  bolus 2 to 4 units for relatively larger carbohydrate meals  Advised him to have some protein with his breakfast and not to get cereal very  often  Discussed blood sugar targets at various times  Discussed appropriate treatment of hypoglycemia We can observe him without any Lantus insulin and if his fasting readings stay over 150 and can had at least 10 units of basal insulin  Lipids: We will recheck on the next visit, likely improved with better diabetes control and diet  There are no Patient Instructions on file for this visit.    Counseling time on subjects discussed in assessment and plan sections is over 50% of today's 25 minute visit   Elayne Snare 10/22/2017, 1:54 PM   Note: This office note was prepared with Dragon voice recognition system technology. Any transcriptional errors that result from this process are unintentional.

## 2017-10-22 NOTE — Telephone Encounter (Signed)
Pt and wife have been informed that the pre visit for 10-25-2017 has been cancelled and pt is now to see J.Zehr PA on 10-26-2017.  Pyrtle, Henry Lines, MD  Kimber Relic, RN  Cc: [name], CMA        [name]  I recommend an office visit 1st with me or APP to discuss fitness and willingness for repeat colonoscopy  If he wishes to proceed with procedure, then this would need to be at [facility] on 1 of my outpatient days  I have copied [name] to help with the appt  Thanks  JMP   Previous Messages    ----- Message -----  From: Kimber Relic, RN  Sent: 10/22/2017  7:25 AM EDT  To: Henry Bears, MD  Subject: colonoscopy scheduled for 10/31 at [facility]      Dr Hilarie Fredrickson,  This patient is scheduled for Colon, for hx of polyps,11/08/17 at [facility]  Patient has a very complicated medical hx. Last EF was 30-35%  Would you like an office visit for this patient or direct for [facility]  (last colon at [facility])  Thanks, Koren Shiver

## 2017-10-23 DIAGNOSIS — I129 Hypertensive chronic kidney disease with stage 1 through stage 4 chronic kidney disease, or unspecified chronic kidney disease: Secondary | ICD-10-CM | POA: Diagnosis not present

## 2017-10-23 DIAGNOSIS — D631 Anemia in chronic kidney disease: Secondary | ICD-10-CM | POA: Diagnosis not present

## 2017-10-23 DIAGNOSIS — R319 Hematuria, unspecified: Secondary | ICD-10-CM | POA: Diagnosis not present

## 2017-10-23 DIAGNOSIS — N183 Chronic kidney disease, stage 3 (moderate): Secondary | ICD-10-CM | POA: Diagnosis not present

## 2017-10-23 DIAGNOSIS — N2581 Secondary hyperparathyroidism of renal origin: Secondary | ICD-10-CM | POA: Diagnosis not present

## 2017-10-23 NOTE — Progress Notes (Signed)
Henry [name] was seen today in the movement disorders clinic for neurologic consultation at the request of [name].  His PCP is Tonia Ghent, MD.  The consultation is for the evaluation of PD and to manage his DBS.  He is accompanied by his wife who supplements the history.  The first symptom(s) the patient noticed was right hand tremor in 1999.  He was seen by neurology and was dx with PD.  He was placed on something, but it caused sleepiness.  He does not think that he has ever been on levodopa.   He was placed on Mirapex, which seemed to help.  He only takes it twice per day.  He didn't think it made a difference when he took it tid.  He began to have tremor and it was suggested he do DBS.    The pt is s/p stn DBS in 2005.  He had a battery change in 2009.    10/02/12 update: The patient is accompanied today by his daughter, who supplements the history.  I reviewed medical records available to me since last visit.  The patient had his generator changed on 09/05/2012.  He remains on pramipexole, 0.5 mg twice per day.  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 [name] in that regard.  He is not compliant with CPAP so says that he doesn't want to return to her for f/u. He doesn't take the klonopin faithfully.  10/11/12 update:  Pt was seen as a walk in/work in today.  Pt had increasing tremor, L greater than R for 3 days.  Thinks that it started with the d/c of artane.  Speech stable.  11/19/12 update:  Pt is seen today for his PD, accompanied by his daughter who supplements the hx.    He is currently on klonopin 0.5 mg - 1/2-1 tablet q hs.  He only takes it when his wife is not working.  She works nights and is only in 2 days per week.  He has some reluctance to take it other nights.  He is on pramipexole 0.5 mg bid.  Last visit, his DBS was reset more similar to the settings he had prior to coming here, just with an increased voltage.  About 2 weeks after our last  visit, the patient decided to go back on the Artane.  The combination of the Artane and the DBS changes helped significantly.  He does ask if I can slightly increased voltage on the left hand, as he has some tremor at night that is bothersome.  Otherwise, he is doing well.  He feels that his balance has been great.  02/19/13 update:  This patient is accompanied in the office by his child who supplements the history.  The pt has a hx of PD.  He has been tremor free and is very happy about that.  He has had some increased balance loss; the last fall was a few months ago but he hasn't gotten hurt.  Was considering hernia surgery.  The records that were made available to me were reviewed.  He is holding on that for now but plans to have it done before end of year.  No hallucinations.  He is still having some acting out of the dreams, despite clonazepam.  06/19/13 update:  Patient is returning to followup regarding his Parkinson's disease.  I had the opportunity to review records since last visit.  He went to the emergency room on  05/12/2013 with chest pain and shortness of breath.  He had been fishing and had missed several doses of Lasix.  He had been eating seafood.  He ended up with an acute exacerbation of his chronic congestive heart failure.  Once he was diuresed and back on his medications, he is feeling better.  He just had a nuclear medicine study done on 06/17/2013 and the ejection fraction on this looked better than on his echocardiogram.  The left ventricular ejection fraction was 41%.  Prior to that, there was some concern that his left ventricular ejection fraction had dropped so much that he may need an ICD.  In terms of Parkinson's disease, the patient states that he has been doing very well in terms of tremor, but his walking really has deteriorated.  He describes a festinating gait, with much more shuffling.  He fell walking over his dog gate but otherwise has not had falls.  He went fishing  yesterday and states that he "stumbled all day long."  No hallucinations.  He has been trying to be more faithful with his CPAP. He remains on Mirapex 0.5 mg twice a day, Artane 2 mg twice a day.  10/23/13 update:  The patient returns today to the clinic, accompanied by his wife who supplements the history.  From a Parkinson's standpoint, the patient states that he has been doing very well.  No tremor.  He started on levodopa last visit and states that he has had no falls and overall stumbling has been much better.  He states that the only time that he stumbles is if he is inside of his fishing boat, and states that that is really because it is small and unstable.  He is taking his levodopa in the morning, after lunch and bedtime.  He has had no hallucinations.  His wife states that he is still draining and acting out the dreams and even fell out of bed a few days ago.  However, he is taking his clonazepam about 10 PM but doesn't go to bed until 1 AM.  He is not using his CPAP.  Is having significant constipation.   I reviewed his cardiology records since last visit.  He did have a heart catheterization and is scheduled to have an ICD placed on November 5.  02/23/14 update:  Pt returns for follow up accompanied by his wife who supplements the history.  The records that were made available to me were reviewed since last visit.  He had an ICD placed on 11/15/13.  He is doing well.  Taking carbidopa/levodopa 25/100 three times a day and remains on artane as well as mirapex 0.5 mg bid.  Had a fall up the stairs.  Wife describes festinating gate and pt thinks that levodopa contributes.  He also doesn't feel good much of the time and thinks that is from levodopa.  Admits, however, that when BS under good control, he feels good.  Has started seeing endocrinology and started on new meds.  He has klonopin for RBD.  Still vivid dreams but no falling out bed (in recliner now).  Having some word finding trouble and memory  doesn't seem clear.  Also, put back tailgate down on car and came down on generator and wants me to look at that.    07/01/14 update:  The patient follows up today regarding his Parkinson's disease.  He is accompanied by his wife who supplements the history.  Last visit, the patient was complaining about memory change which  I thought was likely multifactorial and due to medication such as Artane, Mirapex and Ultram, which he was taking every 3 hours.  The patient, however, thought that it was from levodopa and so we held it and the patient reported that he felt better and therefore continued to stay off of the medication.  He reports that falls are better after d/c levodopa but wife reports still falls.  Pt states that he only fell in shower after he d/c levodopa.  His wife does admit that she thinks that levodopa was causing loss of balance.  He is having more tremor over the last month on the L hand.   He remains on clonazepam 0.5 mg, 1-1/2 tablets at night for REM behavior disorder.  I reviewed records since last visit.  He has seen Dr. Dwyane Dee multiple times in regards to his uncontrolled diabetes.  He started on insulin on May 22 2014.  He states that he is doing well with injecting himself.  Having a lot of constipation.  Has the rancho recipe.    11/03/14 update:  The patient is following up today, accompanied by his wife who supplements the history.  Records were reviewed since our last visit.  The patient is on pramipexole 0.5 mg twice a day and Artane 2 mg twice a day.  He has not had any hallucinations.  Had a fall the other day walking up stairs carrying something and fell backward.  Hit his back.  No LOC.  Also fell backward in the summer around the pool.  [place] one time out of his boat.  Doesn't want to use a walker. He was in the emergency room recently with a rash that was felt secondary to insect bites that he got in the woods.  He also recently had a colonoscopy.  His diabetes is under better  control and his last A1c was 7.2.  States that he now has an insulin pump.  He remains on clonazepam 0.5 mg, 1-1/2 tablets at night for REM behavior disorder.  More back pain lately and will start injections for the back which have helped previously  02/04/15 update:  The patient presents today, accompanied by his wife who supplements the history.  I have reviewed prior records made available to me.  He has established a new primary care doctor in Dr. Damita Dunnings.  The patient remains on Artane, 2 mg twice a day as well as pramipexole 0.5 mg twice a day.  Overall, he has been doing fairly well.  He occasionally still has some tremor.  He had 2 falls since our last visit; he states that he was pushing a gate open and it came back and hit him.  He was also carrying wood the other day and fell forward with wood.  No hallucinations.  No lightheadedness or near syncope.  He remains on clonazepam 0.5 mg, 1-1/2 tablets at night for REM behavior disorder.  [facility] is [facility] at [facility].    06/08/15 update:  This patient is accompanied in the office by his spouse who supplements the history.  The patient remains on Artane, 2 mg twice a day as well as pramipexole 0.5 mg twice a day.  He tripped over the curb at [employer].  He tripped over the step on his deck.   He remains on clonazepam 0.5 mg, 1-1/2 tablets at night for REM behavior disorder.  He is still having trouble with that.  He fell out of bed the other night.  Wife asks about bed rails.   He felt that [facility] helped.  Is looking forward to getting in his pool.    09/09/15 update:  The patient follows up today, accompanied by his wife who supplements the history.  He is on pramipexole 0.5 g twice a day and trihexyphenidyl 2 mg twice a day.  Didn't go up to the 1.5 mg of pramipexole.   He is on clonazepam 0.5 mg, 1-1/2 tablets at night for REM behavior disorder.  His biggest issue is RLS at bedtime.  It is preventing him from going to sleep.    He denies any hallucinations.  He denies cognitive change.  He continues to struggle with balance and has fallen/stumbled a few times.  He has fallen in the boat.  Wife describes festinating gait.  Wife also describes crying frequently.  Denies depression. He has his battery changed on 06/10/15  12/18/15 update:  Pt is following up today, accompanied by his wife who supplements the history.  Last visit, I changed him from pramipexole, 0.5 mg twice per day to Mirapex ER, 1.5 mg once per day.  He states that it is very expensive.  He remains on trihexyphenidyl, 2 mg twice per day.  He is also on clonazepam 0.5 mg, 1-1/2 tablets at night for REM behavior disorder. It turns out he was taking this at 6pm.   I reviewed records since last visit.  He reports that his pharmacist to come off his Aldactone because of an interaction with his Parkinson's medicine.  I am unaware of any interaction with his current medications.  Been having issues with constipation, which has been treated by Dr. Damita Dunnings.  [facility] is in [facility].  When asked about falls, his wife states that he falls all the time but he is more conscious of festinating gait now that he is in PT.  Not gotten hurt with falls.    07/13/16 update:  Pt f/u today for PD, accompanied by his wife, who supplements the history.  The records that were made available to me were reviewed since last visit.  On pramipexole ER 1.5 mg daily and artane 2 mg bid.  Was on klonopin for RBD but he d/c the medication.  He thought that it caused insomnia.  He is still acting out the dreams.  He won't use his CPAP machine.  States that he is afraid of it malfunctioning.  Often will sleep in the recliner.  Having more back pain.  Having injections and 3rd is next week at [facility].    Wearing off:  No.  How long before next dose:  n/a Falls:   Yes.  , 12 months but some of these were falls out of bed in RBD N/V:  No. Hallucinations:  Yes.   (none visual but few auditory and has  gotten out of bed to see who was there)  visual distortions: No. Lightheaded:  No.  Syncope: No. Dyskinesia:  No.   12/15/16 update: Patient seen today in follow-up for his Parkinson's disease.  He is accompanied by his wife who supplements the history.  He remains on pramipexole ER, 1.5 mg daily.  He is also on trihexyphenidyl, 2 mg.  He was taking bid but he looked at his bottle and realized he was supposed to be taking it qd and just started doing that.  He has not had any hallucinations, except a few arising in the middle of the night out of a vivid dream.  No compulsive  behaviors.  I have reviewed records made available to me from his other providers, including his endocrinologist, cardiologist and primary care physician.  Off of klonopin.  "If I take it I sleep the entire next day."  He is having vivid dreams.  States that he has not fallen that much until last night.  Wife describes festinating gait.  Having more trouble in the shower.  A shower chair will not fit in his shower.  They have a small corner seat, but he states that he cannot wash his feet if he sits in it.  It is too slippery.  He is a currently attending [facility] through [facility] for his low back pain.  He does like that.  04/20/17 update: Patient is seen today in follow-up for his Parkinson's disease.  Patient is accompanied by his wife who supplements the history.  Patient is on pramipexole, 1.5 mg daily.  He is also on Artane, 2 mg daily.  No hallucinations.  No lightheadedness or near syncope.  I have talked to his primary care physician about him.  Primary care wanted to get an MRI of his brain due to hearing loss. 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  Discussed with his primary care physician that if he needed MRI, his device is MRI compatible for head MRI, but would need [employer] rep there to turn off the device and make sure  impedances were normal the day of MRI. Has new hearing aid as of today and doing well with that.   [place] today and tried to get off of toilet and hit door knob and fell.  Still going fishing and friend helps him navigate the water.  Went yesterday. Having more renal dysfunction and urinary urgency and dysuria.  No UTI per urology and placed on myrbetriq.  Has referral to nephrology but has not heard from them yet.  06/29/17 update: Patient is seen today in follow-up for Parkinson's.  We tried to discontinue his trihexyphenidyl, but they felt that his walking got worse after discontinuing the medication.  It was restarted, but I told them to trial off of it again as trihexyphenidyl generally has a negative effect on walking and not positive.  He ended up staying on the medication.  Overall, they do think that balance has been worse as has been acting out of the dreams.  He has had 2-3 falls.  He is having freezing.  Battery site sore since fall.   He describes hallucinations but they arise out of the sleep.  He will start dreaming and it feels real.  He states that it goes away when wakes up.  Wife isn't so sure.  He is tried clonazepam but that it made him too sleepy.  wife states that she restarted klonopin a week ago, 0.5 mg, and wife thinks that it has helped sleep but still having a bit of that.  He is still on pramipexole ER, 1.5 mg daily.    07/23/17 update: Patient is seen today in follow-up for Parkinson's.  His device was adjusted last visit and I also had him start back on carbidopa/levodopa 25/100 and work to 1 tablet 3 times per day.  His pramipexole was decreased to 0.25 mg tid.  Today, he reports that he is feeling more stable with this change in meds.  He is back on klonopin and sleeping well.  He  fell in the kitchen.  He was near the stove and went to reach for the refrigerator and fell and glasses cut his face.  He had his walker near him but wasn't using it because "I didn't need it."     He is  having trouble remembering middle of the day dosing but generally takes med at 8am/2pm/8pm (didn't take 2 pm today and seen at 3pm)  10/25/17 update: Patient is seen today in follow-up for Parkinson's disease, accompanied by his wife who supplements history.  He is supposed to be on carbidopa/levodopa 25/100, 2/2/1 but is still only on carbidopa/levodopa 25/100 tid.  He reports that he started on the correct dosage initially but had old bottle and was running out early. Wife states that he thinks she is trying to harm him when given proper dose since old directions were on the bottle.   He then tells me "it makes me too dizzy."    He is off of pramipexole and artane.  Multiple falls but falling always without the walker.  Has hit head hard with fall onto oak cabinet and worries because tremor worse after that.   More cramping in the feet at night.    PREVIOUS MEDICATIONS: Mirapex and artane, levodopa (made "too loose"); clonazepam (overly sleepy)  ALLERGIES:   Allergies  Allergen Reactions  . Penicillins Rash and Other (See Comments)    WEARS ALLERGY BRACELET Because of a history of documented adverse serious drug reaction;Medi Alert bracelet  is recommended Has patient had a PCN reaction causing immediate rash, facial/tongue/throat swelling, SOB or lightheadedness with hypotension: Yes Has patient had a PCN reaction causing severe rash involving mucus membranes or skin necrosis: unknown Has patient had a PCN reaction that required hospitalization NO Has patient had a PCN reaction occurring within the last 10 years: NO If all of   . Klonopin [Clonazepam]     agitation  . Peanut-Containing Drug Products Other (See Comments)    cough  . Watermelon [Citrullus Vulgaris]     Tickle in throat and cough    CURRENT MEDICATIONS:   Allergies as of 10/25/2017      Reactions   Penicillins Rash, Other (See Comments)   WEARS ALLERGY BRACELET Because of a history of documented adverse serious drug  reaction;Medi Alert bracelet  is recommended Has patient had a PCN reaction causing immediate rash, facial/tongue/throat swelling, SOB or lightheadedness with hypotension: Yes Has patient had a PCN reaction causing severe rash involving mucus membranes or skin necrosis: unknown Has patient had a PCN reaction that required hospitalization NO Has patient had a PCN reaction occurring within the last 10 years: NO If all of    Klonopin [clonazepam]    agitation   Peanut-containing Drug Products Other (See Comments)   cough   Watermelon [citrullus Vulgaris]    Tickle in throat and cough      Medication List        Accurate as of 10/25/17  1:42 PM. Always use your most recent med list.          acetaminophen 325 MG tablet Commonly known as:  TYLENOL Take 1-2 tablets (325-650 mg total) by mouth every 4 (four) hours as needed for mild pain.   albuterol 108 (90 Base) MCG/ACT inhaler Commonly known as:  PROVENTIL HFA;VENTOLIN HFA Inhale 2 puffs into the lungs every 6 (six) hours as needed for wheezing or shortness of breath.   allopurinol 100 MG tablet Commonly known as:  ZYLOPRIM Take 1.5 tablets (150  mg total) by mouth daily.   aspirin EC 81 MG tablet Take 81 mg by mouth daily.   carbidopa-levodopa 25-100 MG tablet Commonly known as:  SINEMET IR 2 in the morning, 2 in the afternoon, 1 in the evening   cholecalciferol 1000 units tablet Commonly known as:  VITAMIN D Take 1,000 Units by mouth daily.   colchicine 0.6 MG tablet TAKE 1 TABLET BY MOUTH TWICE A DAY AS NEEDED FOR GOUT   ferrous sulfate 325 (65 FE) MG EC tablet Take 325 mg by mouth daily.   Fluticasone-Salmeterol 250-50 MCG/DOSE Aepb Commonly known as:  ADVAIR Inhale 1 puff into the lungs 2 (two) times daily.   furosemide 80 MG tablet Commonly known as:  LASIX TAKE 1 & 1/2 TABLETS DAILY   gabapentin 100 MG capsule Commonly known as:  NEURONTIN 1 tab in the AM and 2-3 tabs at night.   KLOR-CON M20 20 MEQ  tablet Generic drug:  potassium chloride SA TAKE 2 TABLETS (40 MEQ TOTAL) BY MOUTH DAILY.   levothyroxine 125 MCG tablet Commonly known as:  SYNTHROID, LEVOTHROID TAKE 1 TABLET EVERY DAY DAILY BEFORE BREAKFAST   lisinopril 2.5 MG tablet Commonly known as:  PRINIVIL,ZESTRIL TAKE 1 TABLET BY MOUTH DAILY.   metolazone 2.5 MG tablet Commonly known as:  ZAROXOLYN TAKE 1 TABLET BY MOUTH EVERY DAY AS NEEDED FOR >3 LB IN A DAY OR >5 LB IN A WEEK   metoprolol succinate 50 MG 24 hr tablet Commonly known as:  TOPROL-XL TAKE 1 TABLET BY MOUTH DAILY. TAKE WITH OR IMMEDIATELY FOLLOWING A MEAL.   multivitamin with minerals Tabs tablet Take 1 tablet by mouth daily.   MYRBETRIQ 50 MG Tb24 tablet Generic drug:  mirabegron ER Take 50 mg by mouth daily.   nitroGLYCERIN 0.4 MG SL tablet Commonly known as:  NITROSTAT Place 1 tablet (0.4 mg total) under the tongue every 5 (five) minutes as needed for chest pain.   polyethylene glycol packet Commonly known as:  MIRALAX / GLYCOLAX Take 17 g by mouth daily as needed for moderate constipation.   rosuvastatin 20 MG tablet Commonly known as:  CRESTOR Take 1 tablet (20 mg total) daily by mouth.   traMADol 50 MG tablet Commonly known as:  ULTRAM TAKE 1 TABLET BY MOUTH TWICE A DAY AS NEEDED   travoprost (benzalkonium) 0.004 % ophthalmic solution Commonly known as:  TRAVATAN Place 1 drop into the right eye at bedtime.   V-GO 40 Kit Use one pod daily with insulin   VICTOZA 18 MG/3ML Sopn Generic drug:  liraglutide INJECT 1.8MG ONCE DAILY   vitamin B-12 1000 MCG tablet Commonly known as:  CYANOCOBALAMIN Take 1,000 mcg by mouth daily.        PAST MEDICAL HISTORY:   Past Medical History:  Diagnosis Date  . AICD (automatic cardioverter/defibrillator) present    Dr Lovena Le office visit yearly, MDT   . Arthritis    cane  . Asthma   . Benign neoplasm of colon   . CAD (coronary artery disease)   . Cardiomyopathy   . Cataract   .  Complication of anesthesia    pt states that he got a rash  . Constipation   . 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 DM (diabetes mellitus) ([place])    TYPE 2   . GERD (gastroesophageal reflux disease)   . Glaucoma    right eye  . HLD (hyperlipidemia)   . HTN (hypertension)  pt denies 08/19/12  . Hyperplasia, prostate   . Hyperthyroidism    thyroid lobectomy  . MI (myocardial infarction) [facility])    Dr Stanford Breed 2000, x3vessels bypass  . Nephrolithiasis   . OSA (obstructive sleep apnea)    AHI-28,on CPAP, noncompliant with CPAP  . Parkinson disease ([place])    1999  . PONV (postoperative nausea and vomiting)   . Restless legs   . Shortness of breath    Hx: of at all times  . Sleep apnea    does not use CPAP  . Sleep apnea, organic   . UTI (lower urinary tract infection) 09/15/12   Klebsiella  . Ventral hernia     PAST SURGICAL HISTORY:   Past Surgical History:  Procedure Laterality Date  . 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 CARDIAC CATHETERIZATION    . CATARACT EXTRACTION W/ INTRAOCULAR LENS IMPLANT     Hx: of right eye  . CATARACT EXTRACTION W/ INTRAOCULAR LENS IMPLANT Left 2018  . COLONOSCOPY N/A 10/13/2014   Procedure: COLONOSCOPY;  Surgeon: Jerene Bears, MD;  Location: [facility] ENDOSCOPY;  Service: Gastroenterology;  Laterality: N/A;  . COLONOSCOPY W/ BIOPSIES AND POLYPECTOMY     Hx: of  . CORONARY ARTERY BYPASS GRAFT  2000   [name], MD  . [place]  . DEEP BRAIN STIMULATOR PLACEMENT  2004   Right and left VIN stimulator placement (parkinsons)  . EYE SURGERY    . FINGER AMPUTATION     left pointer  . IMPLANTABLE CARDIOVERTER DEFIBRILLATOR IMPLANT N/A 11/13/2013   Procedure: IMPLANTABLE CARDIOVERTER DEFIBRILLATOR IMPLANT;  Surgeon: Evans Lance, MD;  Location: [facility] CATH LAB;  Service: Cardiovascular;  Laterality: N/A;  . INSERT / REPLACE / REMOVE PACEMAKER    . LEFT AND RIGHT HEART CATHETERIZATION WITH  CORONARY ANGIOGRAM N/A 09/24/2013   Procedure: LEFT AND RIGHT HEART CATHETERIZATION WITH CORONARY ANGIOGRAM;  Surgeon: Burnell Blanks, MD;  Location: [facility] CATH LAB;  Service: Cardiovascular;  Laterality: N/A;  . LITHOTRIPSY     3 different times  . MEDIAN STERNOTOMY  2000  . SUBTHALAMIC STIMULATOR BATTERY REPLACEMENT N/A 09/05/2012   Procedure: Deep brain stimulator battery change;  Surgeon: Erline Levine, MD;  Location: [place] NEURO ORS;  Service: Neurosurgery;  Laterality: N/A;  Deep brain stimulator battery change  . SUBTHALAMIC STIMULATOR BATTERY REPLACEMENT N/A 06/10/2015   Procedure: Deep Brain stimulator battery change;  Surgeon: Erline Levine, MD;  Location: [place] NEURO ORS;  Service: Neurosurgery;  Laterality: N/A;  . TONSILLECTOMY      SOCIAL HISTORY:   Social History   Socioeconomic History  . Marital status: Married    Spouse name: [name]  . Number of children: 2  . Years of education: Not on file  . Highest education level: Not on file  Occupational History  . Occupation: DISABLED    Comment: [employer], [employer]  Social Needs  . Financial resource strain: Not on file  . Food insecurity:    Worry: Not on file    Inability: Not on file  . Transportation needs:    Medical: Not on file    Non-medical: Not on file  Tobacco Use  . Smoking status: Never Smoker  . Smokeless tobacco: Never Used  Substance and Sexual Activity  . Alcohol use: Yes    Comment: occasional beer  . Drug use: No  . Sexual activity: Not on file  Lifestyle  . Physical activity:    Days  per week: Not on file    Minutes per session: Not on file  . Stress: Not on file  Relationships  . Social connections:    Talks on phone: Not on file    Gets together: Not on file    Attends religious service: Not on file    Active member of club or organization: Not on file    Attends meetings of clubs or organizations: Not on file    Relationship status: Not on file  . Intimate partner violence:    Fear  of current or ex partner: Not on file    Emotionally abused: Not on file    Physically abused: Not on file    Forced sexual activity: Not on file  Other Topics Concern  . Not on file  Social History Narrative   From [facility]   Retired/disability Clinical research associate   Likes to fish.     Married 1972   3 kids   [place] fan    FAMILY HISTORY:   Family Status  Relation Name Status  . Mother  Deceased       complications of surgery  . Father  Deceased  . Brother  Alive       1 brother, 1 half brother  . Sister  Alive       2 half sisters  . Child  Alive  . Unknown  (Not Specified)  . MGM  Deceased  . MGF  Deceased  . PGM  Deceased  . PGF  Deceased  . Sister  Alive  . Brother  Alive  . Child  Alive  . Child  Alive  . Neg Hx  (Not Specified)    ROS:  Review of Systems  Constitutional: Positive for malaise/fatigue.  HENT: Negative.   Eyes: Negative.   Respiratory: Negative.   Cardiovascular: Negative.   Musculoskeletal: Positive for falls.  Skin: Negative.   Neurological: Positive for tremors.  Psychiatric/Behavioral: The patient has insomnia.      PHYSICAL EXAMINATION:    VITALS:   Vitals:   10/25/17 1336  BP: 112/72  Pulse: 82  SpO2: 91%  Weight: 255 lb (115.7 kg)  Height: _0  (1.753 m)   GEN:  The patient appears stated age and is in NAD. HEENT:  Normocephalic, atraumatic.  The mucous membranes are moist. The superficial temporal arteries are without ropiness or tenderness. CV:  RRR Lungs:  CTAB Neck/HEME:  There are no carotid bruits bilaterally.  Neurological examination:  Orientation: The patient is alert and oriented x3. Cranial nerves: There is good facial symmetry. The speech is fluent and mildly dysarthric. Soft palate rises symmetrically and there is no tongue deviation. Hearing is intact to conversational tone. Sensation: Sensation is intact to light touch throughout Motor: Strength is at least antigravity x 4  Movement  examination: Tone: There is normal tone in the upper and lower 70s. Abnormal movements: There is no tremor noted Coordination:  There is  decremation with RAM's, with any form of RAMS, including alternating supination and pronation of the forearm, hand opening and closing, finger taps, heel taps and toe taps. Gait and Station: The patient pushes off of the chair to arise.  He initially shows me that he can walk well without a walker and has trouble with the turns.  In the turns, he certainly gets off balance.  He is then given a U step walker and does better with that.  He still has some freezing/turning en bloc in the turn.  LABS  Lab Results  Component Value Date   WBC 7.8 10/01/2017   HGB 11.7 (L) 10/01/2017   HCT 36.9 (L) 10/01/2017   MCV 87.4 10/01/2017   PLT 156 10/01/2017   Lab Results  Component Value Date   TSH 4.57 (H) 05/17/2017     Chemistry      Component Value Date/Time   NA 135 10/16/2017 1015   NA 142 12/22/2016 1233   K 3.8 10/16/2017 1015   CL 94 (L) 10/16/2017 1015   CO2 32 10/16/2017 1015   BUN 43 (H) 10/16/2017 1015   BUN 33 (H) 12/22/2016 1233   CREATININE 1.91 (H) 10/16/2017 1015   CREATININE 2.13 06/25/2017   CREATININE 1.53 (H) 05/22/2016 1106      Component Value Date/Time   CALCIUM 9.1 10/16/2017 1015   ALKPHOS 80 05/17/2017 1439   AST 14 05/17/2017 1439   ALT 25 05/17/2017 1439   BILITOT 0.3 05/17/2017 1439     Lab Results  Component Value Date   VITAMINB12 956 (H) 02/22/2017   Lab Results  Component Value Date   HGBA1C 8.4 (A) 09/19/2017   DBS programming was performed today, which is described in more detail on a separate programming procedural notes.   ASSESSMENT/PLAN:  1.  Idiopathic Parkinson's disease.    -Now off trihexyphenidyl and pramipexole  -His DBS battery was last changed on 09/05/2012 and on 06/10/15.  It is definitely at end of life and needs replacement ASAP.  Contacted Dr. Melven Sartorius office.  -use walker at all  times  -CT brain today  -RX U step walker.  His current walker is getting ahead of him and he festinate's and then is a greater fall risk.  -d/c carbidopa/levodopa 25/100 IR  -start carbidopa/levodopa 25/100 CR, 2 tablets at 7am, 2 at noon, 1 at 4pm, 1 at bedtime.  Hopeful that this will help the cramping.  -Safety discussed in detail. 2.  RBD  -He has tried clonazepam several times and ultimately ends up stopping it because of side effect.  Could try Rozerem in the future if insurance would pay.  Decided to hold off for now as the patient states that while dreaming is vivid, it is not particularly bothersome and he is not falling out of the bed 3.  Pseudobulbar affect (crying)  -Wants no medication for that. 4.  OSAS, noncompliant with CPAP.  -Unfortunately, he has been unable to tolerate CPAP.  Talked about dental appliances, which he does not wish to try either. 5.  CHF  -ICD placed on 11/15/2013 6. Follow up will be in the next few months, after his DBS battery has been replaced.  Much greater than 50% of this visit was spent in counseling and coordinating care.  Total face to face time:  40 min which didn't include DBS time

## 2017-10-24 ENCOUNTER — Ambulatory Visit (INDEPENDENT_AMBULATORY_CARE_PROVIDER_SITE_OTHER): Payer: Medicare Other | Admitting: *Deleted

## 2017-10-24 DIAGNOSIS — I10 Essential (primary) hypertension: Secondary | ICD-10-CM

## 2017-10-24 DIAGNOSIS — I5022 Chronic systolic (congestive) heart failure: Secondary | ICD-10-CM

## 2017-10-24 NOTE — Progress Notes (Signed)
Remote ICD transmission.   

## 2017-10-25 ENCOUNTER — Ambulatory Visit
Admission: RE | Admit: 2017-10-25 | Discharge: 2017-10-25 | Disposition: A | Discharge status: Home / Self Care | Payer: Medicare Other | Source: Ambulatory Visit | Attending: Neurology | Admitting: Neurology

## 2017-10-25 ENCOUNTER — Encounter: Payer: Self-pay | Admitting: Neurology

## 2017-10-25 ENCOUNTER — Telehealth: Payer: Self-pay | Admitting: Neurology

## 2017-10-25 ENCOUNTER — Telehealth: Payer: Self-pay | Admitting: Endocrinology

## 2017-10-25 ENCOUNTER — Ambulatory Visit (INDEPENDENT_AMBULATORY_CARE_PROVIDER_SITE_OTHER): Payer: Medicare Other | Admitting: Neurology

## 2017-10-25 VITALS — BP 112/72 | HR 82 | Ht 69.0 in | Wt 255.0 lb

## 2017-10-25 DIAGNOSIS — W19XXXA Unspecified fall, initial encounter: Secondary | ICD-10-CM

## 2017-10-25 DIAGNOSIS — S0990XA Unspecified injury of head, initial encounter: Secondary | ICD-10-CM

## 2017-10-25 DIAGNOSIS — G4752 REM sleep behavior disorder: Secondary | ICD-10-CM

## 2017-10-25 DIAGNOSIS — I255 Ischemic cardiomyopathy: Secondary | ICD-10-CM

## 2017-10-25 DIAGNOSIS — G20 Parkinson's disease: Secondary | ICD-10-CM

## 2017-10-25 DIAGNOSIS — G20A1 Parkinson's disease without dyskinesia, without mention of fluctuations: Secondary | ICD-10-CM

## 2017-10-25 MED ORDER — CARBIDOPA-LEVODOPA ER 25-100 MG PO TBCR: EXTENDED_RELEASE_TABLET | ORAL | 1 refills | Status: DC

## 2017-10-25 MED ORDER — AMBULATORY NON FORMULARY MEDICATION: 0 refills | Status: DC

## 2017-10-25 NOTE — Telephone Encounter (Signed)
[facility] [place] received a fax re: diabetic supplies from our office but it was missing pages. Please resend fax to fax# [phone]

## 2017-10-25 NOTE — Telephone Encounter (Signed)
Referral faxed to [facility] at [phone] with confirmation received. They will contact the patient to schedule.  

## 2017-10-25 NOTE — Telephone Encounter (Signed)
-----   Message from [place], DO sent at 10/25/2017  3:29 PM EDT ----- Let pt know that CT brain is okay

## 2017-10-25 NOTE — Procedures (Signed)
DBS Programming was performed.    Total time spent programming was 5 minutes.  Device was confirmed to be on.  Soft start was confirmed to be on.  Impedences were checked and were within normal limits.  Battery was checked and was at end of life (2.53).    Detailed analysis on separate neurophysiologic worksheet.    Final settings were as follows:  Left brain electrode:     1-2+           ; Amplitude  4.3   V   ; Pulse width 90 microseconds;   Frequency   160   Hz.  Right brain electrode:     4-7+          ; Amplitude   4.5  V ;  Pulse width 90  microseconds;  Frequency   160    Hz. (had hand pull on the left with V of 4.6)

## 2017-10-25 NOTE — Patient Instructions (Addendum)
STOP the carbidopa/levodopa 25/100 IR that you have (the yellow pill)  start carbidopa/levodopa 25/100 CR, 2 tablets at 7am, 2 at noon, 1 at 4pm, 1 at bedtime.  We are referring you to Dr Vertell Limber at [place]. They will call you directly to set up an appointment date and time. If you do not hear from them they can be contacted directly at [phone].

## 2017-10-25 NOTE — Telephone Encounter (Signed)
Mychart message sent to patient.

## 2017-10-26 ENCOUNTER — Ambulatory Visit (INDEPENDENT_AMBULATORY_CARE_PROVIDER_SITE_OTHER): Payer: Medicare Other | Admitting: Gastroenterology

## 2017-10-26 ENCOUNTER — Encounter: Payer: Self-pay | Admitting: Cardiology

## 2017-10-26 ENCOUNTER — Encounter: Payer: Self-pay | Admitting: Gastroenterology

## 2017-10-26 ENCOUNTER — Telehealth: Payer: Self-pay

## 2017-10-26 VITALS — BP 104/60 | HR 79 | Ht 69.0 in | Wt 256.0 lb

## 2017-10-26 DIAGNOSIS — IMO0001 Reserved for inherently not codable concepts without codable children: Secondary | ICD-10-CM

## 2017-10-26 DIAGNOSIS — I255 Ischemic cardiomyopathy: Secondary | ICD-10-CM

## 2017-10-26 DIAGNOSIS — Z794 Long term (current) use of insulin: Secondary | ICD-10-CM

## 2017-10-26 DIAGNOSIS — E119 Type 2 diabetes mellitus without complications: Secondary | ICD-10-CM | POA: Diagnosis not present

## 2017-10-26 DIAGNOSIS — Z86010 Personal history of colonic polyps: Secondary | ICD-10-CM

## 2017-10-26 NOTE — Telephone Encounter (Signed)
Dear Dr. Elayne Snare,     Dr. Thornton Park has scheduled the above patient for a colonoscopy at 9:30AM on 11/12/17 at [facility].  Our records show that he/she is on insulin therapy via an insulin pump.  Our colonoscopy prep protocol requires that:  the patient must be on a clear liquid diet the entire day prior to the procedure date as well as the morning of the procedure  the patient must be NPO for 3 hours prior to the procedure   the patient must consume a PEG 3350 solution to prepare for the procedure.  Please advise [place] of any adjustments that need to be made to the patient's insulin pump therapy prior to the above procedure date.    Please route or fax back this completed form to me at [phone] .  If you have any question, please call me at [phone].  Thank you for your help with this matter.  Sincerely,  Lorren Rossetti [place], [place] Deborra Medina)   Physician Recommendation:  ________________________________________________  ________________________________________________________________________  ________________________________________________________________________  ________________________________________________________________________

## 2017-10-26 NOTE — Progress Notes (Signed)
Reviewed. I agree with documentation including the assessment and plan.  Anquanette Bahner L. Ronit Marczak, MD, MPH 

## 2017-10-26 NOTE — Telephone Encounter (Signed)
Talk to Patti-CMA- Dr. Dwyane Dee instructions for the pt and wrote/faxed for documentation sent to [phone]

## 2017-10-26 NOTE — Telephone Encounter (Signed)
Dear Dr. Elayne Snare,     Dr. Thornton Park has scheduled the above patient for a colonoscopy at 9:30AM on 11/12/17 at [facility].  Our records show that he/she is on insulin therapy via an insulin pump.  Our colonoscopy prep protocol requires that:  the patient must be on a clear liquid diet the entire day prior to the procedure date as well as the morning of the procedure  the patient must be NPO for 3 hours prior to the procedure   the patient must consume a PEG 3350 solution to prepare for the procedure.  Please advise [place] of any adjustments that need to be made to the patient's insulin pump therapy prior to the above procedure date.    Please route or fax back this completed form to me at [phone] .  If you have any question, please call me at [phone].  Thank you for your help with this matter.  Sincerely,  Henry [name], Henry [name])   Physician Recommendation:  ________________________________________________  ________________________________________________________________________  ________________________________________________________________________  ________________________________________________________________________

## 2017-10-26 NOTE — Telephone Encounter (Signed)
Patient will need to use a 30 unit V-go pump the day before and during the procedure and no other adjustments.  If he has the 30 unit pump at home he can use this otherwise we can give him a sample

## 2017-10-26 NOTE — Telephone Encounter (Signed)
Patients wife informed and Henry [name] was sitting in the room as I explained it to her via telephone. They will go and get the sample from Dr Ronnie Derby office as they don't have any 30 size pumps at home.

## 2017-10-26 NOTE — Patient Instructions (Signed)
  You have been scheduled for a colonoscopy. Please follow written instructions given to you at your visit today.  Please pick up your prep supplies at the pharmacy. If you use inhalers (even only as needed), please bring them with you on the day of your procedure.   We will be in touch with you about regulating your insulin pump after we contact Dr Dwyane Dee.    I appreciate the opportunity to care for you. Alonza Bogus, PA-C

## 2017-10-26 NOTE — Progress Notes (Signed)
10/26/2017 Henry [name] [id number] April 16, 1950   HISTORY OF PRESENT ILLNESS: This is a 67 year old male who is a patient of Dr. Vena Rua.  He had a colonoscopy in October 2016 at which time he was found to have several polyps that were removed.  They were all adenomatous polyps and repeat colonoscopy was recommended 3-year interval.  He has multiple medical problems including insulin-dependent diabetes mellitus with pump, he does have a defibrillator and has a low ejection fraction 30 to 35% which is why his procedures were performed performed at [facility], has early onset Parkinson's for which he has a deep brain stimulator.  All of these issues have been present and have been stable since his last colonoscopy.  He was having some issues with constipation, but started using MiraLAX, prune juice, flaxseed, which is helped tremendously.  He denies any rectal bleeding.   Past Medical History:  Diagnosis Date  . AICD (automatic cardioverter/defibrillator) present    Dr Lovena Le office visit yearly, MDT   . Arthritis    cane  . Asthma   . Benign neoplasm of colon   . CAD (coronary artery disease)   . Cardiomyopathy   . Cataract   . Complication of anesthesia    pt states that he got a rash  . Constipation   . 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 DM (diabetes mellitus) ([place])    TYPE 2   . GERD (gastroesophageal reflux disease)   . Glaucoma    right eye  . HLD (hyperlipidemia)   . HTN (hypertension)    pt denies 08/19/12  . Hyperplasia, prostate   . Hyperthyroidism    thyroid lobectomy  . MI (myocardial infarction) [facility])    Dr Stanford Breed 2000, x3vessels bypass  . Nephrolithiasis   . OSA (obstructive sleep apnea)    AHI-28,on CPAP, noncompliant with CPAP  . Parkinson disease ([place])    1999  . PONV (postoperative nausea and vomiting)   . Restless legs   . Shortness of breath    Hx: of at all times  . Sleep apnea    does not use CPAP  .  Sleep apnea, organic   . UTI (lower urinary tract infection) 09/15/12   Klebsiella  . Ventral hernia    Past Surgical History:  Procedure Laterality Date  . 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 CARDIAC CATHETERIZATION    . CATARACT EXTRACTION W/ INTRAOCULAR LENS IMPLANT     Hx: of right eye  . CATARACT EXTRACTION W/ INTRAOCULAR LENS IMPLANT Left 2018  . COLONOSCOPY N/A 10/13/2014   Procedure: COLONOSCOPY;  Surgeon: Jerene Bears, MD;  Location: [facility] END[facility];  Service: Gastroenterology;  Laterality: N/A;  . COLONOSCOPY W/ BIOPSIES AND POLYPECTOMY     Hx: of  . CORONARY ARTERY BYPASS GRAFT  2000   [name], MD  . [place]  . DEEP BRAIN STIMULATOR PLACEMENT  2004   Right and left VIN stimulator placement (parkinsons)  . EYE SURGERY    . FINGER AMPUTATION     left pointer  . IMPLANTABLE CARDIOVERTER DEFIBRILLATOR IMPLANT N/A 11/13/2013   Procedure: IMPLANTABLE CARDIOVERTER DEFIBRILLATOR IMPLANT;  Surgeon: Evans Lance, MD;  Location: [facility] CATH LAB;  Service: Cardiovascular;  Laterality: N/A;  . INSERT / REPLACE / REMOVE PACEMAKER    . LEFT AND RIGHT HEART CATHETERIZATION WITH CORONARY ANGIOGRAM N/A 09/24/2013   Procedure: LEFT AND  RIGHT HEART CATHETERIZATION WITH CORONARY ANGIOGRAM;  Surgeon: Burnell Blanks, MD;  Location: [facility] C[facility] LAB;  Service: Cardiovascular;  Laterality: N/A;  . LITHOTRIPSY     3 different times  . MEDIAN STERNOTOMY  2000  . SUBTHALAMIC STIMULATOR BATTERY REPLACEMENT N/A 09/05/2012   Procedure: Deep brain stimulator battery change;  Surgeon: Erline Levine, MD;  Location: [place] [facility]O [facility];  Service: Neurosurgery;  Laterality: N/A;  Deep brain stimulator battery change  . SUBTHALAMIC STIMULATOR BATTERY REPLACEMENT N/A 06/10/2015   Procedure: Deep Brain stimulator battery change;  Surgeon: Erline Levine, MD;  Location: [place] [facility];  Service: Neurosurgery;  Laterality: N/A;  . TONSILLECTOMY      reports that he has never  smoked. He has never used smokeless tobacco. He reports that he drinks alcohol. He reports that he does not use drugs. family history includes Alcoholism in his father; Aneurysm in his mother; Arthritis in his unknown relative; HIV/AIDS (age of onset: 28) in his brother; Healthy in his child, child, and child; Peripheral vascular disease in his unknown relative. Allergies  Allergen Reactions  . Penicillins Rash and Other (See Comments)    WEARS ALLERGY BRACELET Because of a history of documented adverse serious drug reaction;Medi Alert bracelet  is recommended Has patient had a PCN reaction causing immediate rash, facial/tongue/throat swelling, SOB or lightheadedness with hypotension: Yes Has patient had a PCN reaction causing severe rash involving mucus membranes or skin necrosis: unknown Has patient had a PCN reaction that required hospitalization NO Has patient had a PCN reaction occurring within the last 10 years: NO If all of   . Klonopin [Clonazepam]     agitation  . Peanut-Containing Drug Products Other (See Comments)    cough  . Watermelon [Citrullus Vulgaris]     Tickle in throat and cough      Outpatient Encounter Medications as of 10/26/2017  Medication Sig  . acetaminophen (TYLENOL) 325 MG tablet Take 1-2 tablets (325-650 mg total) by mouth every 4 (four) hours as needed for mild pain.  Marland Kitchen albuterol (PROVENTIL HFA;VENTOLIN HFA) 108 (90 Base) MCG/ACT inhaler Inhale 2 puffs into the lungs every 6 (six) hours as needed for wheezing or shortness of breath.  . allopurinol (ZYLOPRIM) 100 MG tablet Take 1.5 tablets (150 mg total) by mouth daily.  . AMBULATORY NON FORMULARY MEDICATION Ustep walker DX: G20  . aspirin EC 81 MG tablet Take 81 mg by mouth daily.  . Carbidopa-Levodopa ER (SINEMET CR) 25-100 MG tablet controlled release 2 at 7 am, 2 at 12pm, 1 at 4pm, 1 hs  . cholecalciferol (VITAMIN D) 1000 units tablet Take 1,000 Units by mouth daily.  . colchicine 0.6 MG tablet TAKE 1  TABLET BY MOUTH TWICE A DAY AS NEEDED FOR GOUT  . ferrous sulfate 325 (65 FE) MG EC tablet Take 325 mg by mouth daily.  . Fluticasone-Salmeterol (ADVAIR) 250-50 MCG/DOSE AEPB Inhale 1 puff into the lungs 2 (two) times daily.  . furosemide (LASIX) 80 MG tablet TAKE 1 & 1/2 TABLETS DAILY  . gabapentin (NEURONTIN) 100 MG capsule 1 tab in the AM and 2-3 tabs at night. (Patient taking differently: 1 tab in the AM and 2 tabs at night.)  . Insulin Disposable Pump (V-GO 40) KIT Use one pod daily with insulin  . KLOR-CON M20 20 MEQ tablet TAKE 2 TABLETS (40 MEQ TOTAL) BY MOUTH DAILY.  Marland Kitchen levothyroxine (SYNTHROID, LEVOTHROID) 125 MCG tablet TAKE 1 TABLET EVERY DAY DAILY BEFORE BREAKFAST  . lisinopril (PRINIVIL,ZESTRIL) 2.5 MG  tablet TAKE 1 TABLET BY MOUTH DAILY.  . metolazone (ZAROXOLYN) 2.5 MG tablet TAKE 1 TABLET BY MOUTH EVERY DAY AS NEEDED FOR >3 LB IN A DAY OR >5 LB IN A WEEK  . metoprolol succinate (TOPROL-XL) 50 MG 24 hr tablet TAKE 1 TABLET BY MOUTH DAILY. TAKE WITH OR IMMEDIATELY FOLLOWING A MEAL.  . Multiple Vitamin (MULTIVITAMIN WITH MINERALS) TABS tablet Take 1 tablet by mouth daily.   Marland Kitchen MYRBETRIQ 50 MG TB24 tablet Take 50 mg by mouth daily.  . polyethylene glycol (MIRALAX / GLYCOLAX) packet Take 17 g by mouth daily as needed for moderate constipation.  . rosuvastatin (CRESTOR) 20 MG tablet Take 1 tablet (20 mg total) daily by mouth.  . traMADol (ULTRAM) 50 MG tablet TAKE 1 TABLET BY MOUTH TWICE A DAY AS NEEDED  . travoprost, benzalkonium, (TRAVATAN) 0.004 % ophthalmic solution Place 1 drop into the right eye at bedtime.   Marland Kitchen VICTOZA 18 MG/3ML SOPN INJECT 1.'8MG'$  ONCE DAILY  . vitamin B-12 (CYANOCOBALAMIN) 1000 MCG tablet Take 1,000 mcg by mouth daily.  . nitroGLYCERIN (NITROSTAT) 0.4 MG SL tablet Place 1 tablet (0.4 mg total) under the tongue every 5 (five) minutes as needed for chest pain.   No facility-administered encounter medications on file as of 10/26/2017.      REVIEW OF SYSTEMS  :  All other systems reviewed and negative except where noted in the History of Present Illness.   PHYSICAL EXAM: BP 104/60   Pulse 79   Ht '5\' 9"'$  (1.753 m)   Wt 256 lb (116.1 kg)   BMI 37.80 kg/m  General: Well developed white male in no acute distress Head: Normocephalic and atraumatic Eyes:  Sclerae anicteric, conjunctiva pink. Ears: Normal auditory acuity Lungs: Clear throughout to auscultation; no increased WOB. Heart: Regular rate and rhythm; no M/R/G. Abdomen: Soft, non-distended.  BS present.  Non-tender.  Insulin pump noted on abdomen. Rectal:  Will be done at the time of colonoscopy. Musculoskeletal: Symmetrical with no gross deformities  Skin: No lesions on visible extremities Extremities: No edema  Neurological: Alert oriented x 4, pill-rolling tremor noted. Psychological:  Alert and cooperative. Normal mood and affect  ASSESSMENT AND PLAN: *Personal history of colon polyps:  Last colonoscopy 10/2014 with several adenomatous polyps removed.  Needs colonoscopy at the hospital due to low ejection fraction.  Dr. Hilarie Fredrickson does not have any availability so asked that we place the patient on with another physician.  Patient was scheduled with Dr. Tarri Glenn on November 4. *IDDM:  Insulin will be adjusted prior to endoscopic procedure per protocol. Will resume normal dosing after procedure.  **The risks, benefits, and alternatives to colonoscopy were discussed with the patient and he consents to proceed.   CC:  Tonia Ghent, MD

## 2017-10-29 NOTE — Telephone Encounter (Signed)
Refaxed documents [facility] [phone]

## 2017-11-04 ENCOUNTER — Other Ambulatory Visit: Payer: Self-pay | Admitting: Endocrinology

## 2017-11-05 ENCOUNTER — Telehealth: Payer: Self-pay | Admitting: Endocrinology

## 2017-11-05 ENCOUNTER — Telehealth: Payer: Self-pay | Admitting: Gastroenterology

## 2017-11-05 DIAGNOSIS — G20 Parkinson's disease: Secondary | ICD-10-CM | POA: Diagnosis not present

## 2017-11-05 NOTE — Telephone Encounter (Signed)
appt has been cancelled and pt will call to reschedule in a few months

## 2017-11-05 NOTE — Telephone Encounter (Signed)
Patients wife states patients procedure at the hosp on 11.4.19 needs to be canceled due to having other health issues at this time. Patient has to get a battery replaced and was told to wait for colonoscopy for a couple months. Patients wife will call back to reschedule.

## 2017-11-05 NOTE — Telephone Encounter (Signed)
Need form filled out completely for pt's [employer] for the monitor for the sensors. Letter of medical necessity wasn't filled out and office notes. How many time the pt uses insulin per day,

## 2017-11-06 ENCOUNTER — Other Ambulatory Visit: Payer: Self-pay | Admitting: Neurosurgery

## 2017-11-06 NOTE — Telephone Encounter (Signed)
Done and refaxed

## 2017-11-07 ENCOUNTER — Telehealth: Payer: Self-pay | Admitting: Endocrinology

## 2017-11-07 ENCOUNTER — Other Ambulatory Visit: Payer: Self-pay | Admitting: Internal Medicine

## 2017-11-07 DIAGNOSIS — E1165 Type 2 diabetes mellitus with hyperglycemia: Secondary | ICD-10-CM

## 2017-11-07 DIAGNOSIS — Z794 Long term (current) use of insulin: Principal | ICD-10-CM

## 2017-11-07 NOTE — Telephone Encounter (Signed)
Spoke with Hilda Blades from [facility] and she stated patient needs a c-peptide and fasting glucose drawn- labs have been ordered and patient called- I left message requesting a call back to get placed on lab schedule

## 2017-11-07 NOTE — Pre-Procedure Instructions (Signed)
Henry [name]  11/07/2017      [id number] [place], [place] [phone] [facility] [address] [place] [address] Phone: [phone] Fax: [phone]  [facility] [place] [place] [facility] Portal to Registered [employer] [facility] [place] [address] Phone: [phone] Fax: [phone]  [facility], [place] [address] Phone: [phone] Fax: [phone]    Your procedure is scheduled on November 13, 2017.  Report to [facility] Admitting at 945 AM.  Call this number if you have problems the morning of surgery:  [phone]   Remember:  Do not eat or drink after midnight.    Take these medicines the morning of surgery with A SIP OF WATER  Metoprolol  Succinate (Toprol XL) Albuterol inhaler-if needed-bring inhaler with you Allopurinol (zyloprim) Carbidopa-Levodopa ER (sinemet CR) Cholchicine-if needed for gout Gabapentin (neurontin) Levothyroxine (synthroid) Myrbetriq Nitrostat-if needed for chest pain Tramadol-if needed for pain  Follow your surgeon's instructions on when to hold/resume aspirin.  If no instructions were given call the office to determine how they would like to you take aspirin  7 days prior to surgery STOP taking any Aspirin (unless otherwise instructed by your surgeon), Aleve, Naproxen, Ibuprofen, Motrin, Advil, Goody's, BC's, all herbal medications, fish oil, and all vitamins   WHAT DO I DO ABOUT MY DIABETES MEDICATION?   Marland Kitchen Do not take oral diabetes medicines (pills) the morning of surgery.  . THE NIGHT BEFORE SURGERY, take 10 units of Lantus insulin if CBG is greater than 200 (1/2 of your normal dose-if needed)    . The day of surgery, do not take other diabetes injectables, including Victoza (liraglutide).    Patients with Insulin Pumps       For patients with  Insulin Pumps: ? Contact your diabetes doctor for specific instructions before surgery. ? Decrease basal insulin rates by 20% at midnight the night before surgery. ? Note that if your surgery is planned to be longer than 2 hours, your insulin pump will be removed and intravenous (IV) insulin will be started and managed by the nurses and anesthesiologist. You will be able to restart your insulin pump once you are awake and able to manage it. ? Make sure to bring insulin pump supplies to the hospital with you in case your site needs to be changed.  Reviewed and Endorsed by [facility] Patient Education Committee, August 2015  How to Manage Your Diabetes Before and After Surgery  Why is it important to control my blood sugar before and after surgery? . Improving blood sugar levels before and after surgery helps healing and can limit problems. . A way of improving blood sugar control is eating a healthy diet by: o  Eating less sugar and carbohydrates o  Increasing activity/exercise o  Talking with your doctor about reaching your blood sugar goals . High blood sugars (greater than 180 mg/dL) can raise your risk of infections and slow your recovery, so you will need to focus on controlling your diabetes during the weeks before surgery. . Make sure that the doctor who takes care of your diabetes knows about your planned surgery including the date and location.  How do I manage my blood sugar before surgery? . Check your blood sugar at least 4 times a day, starting 2 days before surgery, to make sure  that the level is not too high or low. o Check your blood sugar the morning of your surgery when you wake up and every 2 hours until you get to the Short Stay unit. . If your blood sugar is less than 70 mg/dL, you will need to treat for low blood sugar: o Do not take insulin. o Treat a low blood sugar (less than 70 mg/dL) with  cup of clear juice (cranberry or apple), 4 glucose tablets, OR glucose  gel. Recheck blood sugar in 15 minutes after treatment (to make sure it is greater than 70 mg/dL). If your blood sugar is not greater than 70 mg/dL on recheck, call [phone] o  for further instructions. . Report your blood sugar to the short stay nurse when you get to Short Stay.  . If you are admitted to the hospital after surgery: o Your blood sugar will be checked by the staff and you will probably be given insulin after surgery (instead of oral diabetes medicines) to make sure you have good blood sugar levels. o The goal for blood sugar control after surgery is 80-180 mg/dL.  [place]- Preparing For Surgery  Before surgery, you can play an important role. Because skin is not sterile, your skin needs to be as free of germs as possible. You can reduce the number of germs on your skin by washing with CHG (chlorahexidine gluconate) Soap before surgery.  CHG is an antiseptic cleaner which kills germs and bonds with the skin to continue killing germs even after washing.    Oral Hygiene is also important to reduce your risk of infection.  Remember - BRUSH YOUR TEETH THE MORNING OF SURGERY WITH YOUR REGULAR TOOTHPASTE  Please do not use if you have an allergy to CHG or antibacterial soaps. If your skin becomes reddened/irritated stop using the CHG.  Do not shave (including legs and underarms) for at least 48 hours prior to first CHG shower. It is OK to shave your face.  Please follow these instructions carefully.   1. Shower the NIGHT BEFORE SURGERY and the MORNING OF SURGERY with CHG.   2. If you chose to wash your hair, wash your hair first as usual with your normal shampoo.  3. After you shampoo, rinse your hair and body thoroughly to remove the shampoo.  4. Use CHG as you would any other liquid soap. You can apply CHG directly to the skin and wash gently with a scrungie or a clean washcloth.   5. Apply the CHG Soap to your body ONLY FROM THE NECK DOWN.  Do not use on open wounds or  open sores. Avoid contact with your eyes, ears, mouth and genitals (private parts). Wash Face and genitals (private parts)  with your normal soap.  6. Wash thoroughly, paying special attention to the area where your surgery will be performed.  7. Thoroughly rinse your body with warm water from the neck down.  8. DO NOT shower/wash with your normal soap after using and rinsing off the CHG Soap.  9. Pat yourself dry with a CLEAN TOWEL.  10. Wear CLEAN PAJAMAS to bed the night before surgery, wear comfortable clothes the morning of surgery  11. Place CLEAN SHEETS on your bed the night of your first shower and DO NOT SLEEP WITH PETS.  Day of Surgery:  Do not apply any deodorants/lotions.  Please wear clean clothes to the hospital/surgery center.   Remember to brush your teeth WITH YOUR REGULAR TOOTHPASTE.   Do  not wear jewelry  Do not wear lotions, powders, or colognes, or deodorant.  Men may shave face and neck.  Do not bring valuables to the hospital.  [facility] is not responsible for any belongings or valuables.  Contacts, dentures or bridgework may not be worn into surgery.  Leave your suitcase in the car.  After surgery it may be brought to your room.  For patients admitted to the hospital, discharge time will be determined by your treatment team.  Patients discharged the day of surgery will not be allowed to drive home.   Please read over the following fact sheets that you were given.

## 2017-11-07 NOTE — Telephone Encounter (Signed)
Patient is on lab schedule for 11/08/17 to have labs drawn

## 2017-11-07 NOTE — Progress Notes (Signed)
  Orders per CGM form    Henry [name]

## 2017-11-07 NOTE — Telephone Encounter (Signed)
[facility] called stating the medical necessities was not completed. Please refax. Fax: [phone]

## 2017-11-08 ENCOUNTER — Other Ambulatory Visit: Payer: Medicare Other

## 2017-11-08 ENCOUNTER — Other Ambulatory Visit: Payer: Self-pay

## 2017-11-08 ENCOUNTER — Encounter (HOSPITAL_COMMUNITY)
Admission: RE | Admit: 2017-11-08 | Discharge: 2017-11-08 | Disposition: A | Discharge status: Home / Self Care | Payer: Medicare Other | Source: Ambulatory Visit | Attending: Neurosurgery | Admitting: Neurosurgery

## 2017-11-08 ENCOUNTER — Encounter (HOSPITAL_COMMUNITY): Payer: Self-pay

## 2017-11-08 ENCOUNTER — Encounter: Payer: Medicare Other | Admitting: Internal Medicine

## 2017-11-08 DIAGNOSIS — E119 Type 2 diabetes mellitus without complications: Secondary | ICD-10-CM | POA: Diagnosis not present

## 2017-11-08 DIAGNOSIS — Z794 Long term (current) use of insulin: Secondary | ICD-10-CM | POA: Insufficient documentation

## 2017-11-08 DIAGNOSIS — Z79890 Hormone replacement therapy (postmenopausal): Secondary | ICD-10-CM | POA: Insufficient documentation

## 2017-11-08 DIAGNOSIS — Z7982 Long term (current) use of aspirin: Secondary | ICD-10-CM | POA: Insufficient documentation

## 2017-11-08 DIAGNOSIS — Z01818 Encounter for other preprocedural examination: Secondary | ICD-10-CM | POA: Diagnosis not present

## 2017-11-08 DIAGNOSIS — Z79899 Other long term (current) drug therapy: Secondary | ICD-10-CM | POA: Insufficient documentation

## 2017-11-08 DIAGNOSIS — G20 Parkinson's disease: Secondary | ICD-10-CM | POA: Insufficient documentation

## 2017-11-08 LAB — BASIC METABOLIC PANEL
ANION GAP: 8 (ref 5–15)
BUN: 22 mg/dL (ref 8–23)
CALCIUM: 8.8 mg/dL — AB (ref 8.9–10.3)
CO2: 30 mmol/L (ref 22–32)
Chloride: 100 mmol/L (ref 98–111)
Creatinine, Ser: 1.61 mg/dL — ABNORMAL HIGH (ref 0.61–1.24)
GFR, EST AFRICAN AMERICAN: 50 mL/min — AB (ref >60)
GFR, EST NON AFRICAN AMERICAN: 43 mL/min — AB (ref >60)
Glucose, Bld: 101 mg/dL — ABNORMAL HIGH (ref 70–99)
Potassium: 3.7 mmol/L (ref 3.5–5.1)
SODIUM: 138 mmol/L (ref 135–145)

## 2017-11-08 LAB — CBC
HCT: 40.3 % (ref 39.0–52.0)
Hemoglobin: 12.5 g/dL — ABNORMAL LOW (ref 13.0–17.0)
MCH: 27.3 pg (ref 26.0–34.0)
MCHC: 31 g/dL (ref 30.0–36.0)
MCV: 88 fL (ref 80.0–100.0)
NRBC: 0 % (ref 0.0–0.2)
PLATELETS: 198 10*3/uL (ref 150–400)
RBC: 4.58 MIL/uL (ref 4.22–5.81)
RDW: 15.4 % (ref 11.5–15.5)
WBC: 7.5 10*3/uL (ref 4.0–10.5)

## 2017-11-08 LAB — HEMOGLOBIN A1C
HEMOGLOBIN A1C: 7.2 % — AB (ref 4.8–5.6)
MEAN PLASMA GLUCOSE: 159.94 mg/dL

## 2017-11-08 LAB — SURGICAL PCR SCREEN
MRSA, PCR: NEGATIVE
STAPHYLOCOCCUS AUREUS: NEGATIVE

## 2017-11-08 LAB — GLUCOSE, CAPILLARY: Glucose-Capillary: 98 mg/dL (ref 70–99)

## 2017-11-08 MED ORDER — CHLORHEXIDINE GLUCONATE CLOTH 2 % EX PADS: 6.0000 | MEDICATED_PAD | Freq: Once | CUTANEOUS | Status: DC

## 2017-11-08 NOTE — Progress Notes (Addendum)
PCP: Elsie Stain, MD  Cardiologist: Kirk Ruths, MD  Endocrinologist: Elayne Snare, MD  EKG: 10/02/17 in EPIC  Stress test: 2015 in EPIC  ECHO: 08/2014 in EPIC  Cardiac Cath: 09/2013 in EPIC  Chest x-ray: denies past year, pt denies any recent respiratory infections  Patient has a medtronic ICD.  Orders faxed to [employer] for instructions and e-mail sent to [employer] representatives informing them of surgery date/time/procedure.  Patient has an insulin pump. Contacted the diabetes coordinator, Suanne Marker. She said to have the patient decrease basal rate by 20% at [place] the night before surgery, device should remain on during procedure since it is under 2 hours per coordinator, patient informed and verbalized understanding.  Patient has an appointment with his endocrinologist 11/09/17.  Patient is on aspirin 81 mg -last dose 11/06/17 per MD instructions.

## 2017-11-09 ENCOUNTER — Other Ambulatory Visit (INDEPENDENT_AMBULATORY_CARE_PROVIDER_SITE_OTHER): Payer: Medicare Other

## 2017-11-09 DIAGNOSIS — E1165 Type 2 diabetes mellitus with hyperglycemia: Secondary | ICD-10-CM | POA: Diagnosis not present

## 2017-11-09 DIAGNOSIS — Z794 Long term (current) use of insulin: Secondary | ICD-10-CM | POA: Diagnosis not present

## 2017-11-09 DIAGNOSIS — E890 Postprocedural hypothyroidism: Secondary | ICD-10-CM | POA: Diagnosis not present

## 2017-11-09 LAB — GLUCOSE, RANDOM: Glucose, Bld: 108 mg/dL — ABNORMAL HIGH (ref 70–99)

## 2017-11-09 LAB — TSH: TSH: 5.68 u[IU]/mL — AB (ref 0.35–4.50)

## 2017-11-09 LAB — T4, FREE: Free T4: 0.72 ng/dL (ref 0.60–1.60)

## 2017-11-09 NOTE — H&P (Signed)
Patient ID:   [id number] Patient: Henry [name]  Date of Birth: Jan 20, 1950 Visit Type: Office Visit   Date: 11/05/2017 02:00 PM Provider: Marchia Meiers. Vertell Limber MD   This 67 year old male presents for back pain.  HISTORY OF PRESENT ILLNESS:  1.  back pain  Patient presents today due to needing a replacement of his battery in his IPG that was placed in his right chest. Patient has pacemaker in his left chest and an IPG in his right chest. Patient notes that he has an upcoming colonoscopy.  Currently taking aspirin         Medical/Surgical/Interim History Reviewed, no change.  Last detailed document date:07/31/2012.     PAST MEDICAL HISTORY, SURGICAL HISTORY, FAMILY HISTORY, SOCIAL HISTORY AND REVIEW OF SYSTEMS I have reviewed the patient's past medical, surgical, family and social history as well as the comprehensive review of systems as included on the [facility] history form dated 10/26/2016, which I have signed.  Family History:  Reviewed, no changes.  Last detailed document date:07/31/2012.   Social History: Reviewed, no changes. Last detailed document date: 07/31/2012.    MEDICATIONS: (added, continued or stopped this visit) Started Medication Directions Instruction Stopped   acetaminophen 325 mg tablet take 1 tablet by oral route  every 4 hours as needed     allopurinol 100 mg tablet take 1 tablet by oral route  every day     Amaryl 2 mg tablet take 1 tablet by oral route  every day  11/05/2017   Aspir-81 81 mg tablet,delayed release take 1 tablet by oral route  every day     carbidopa ER 25 mg-levodopa 100 mg tablet,extended release take 1 tablet by oral route 2 times every day     colchicine 0.6 mg tablet take 1 tablet by oral route  every 1 - 2 hours until gout pain subsides     Crestor 20 mg tablet take 1 tablet by oral route  every day     fluticasone 250 mcg-salmeterol 50 mcg/dose blistr powdr for inhalation inhale 1 puff by  inhalation route 2 times every day in the morning and evening approximately 12 hours apart     gabapentin 100 mg capsule take 3 capsule by oral route 3 times every day     iron 325 mg (65 mg iron) tablet take 1 tablet by oral route  every day     Janumet XR 50 mg-1,000 mg tablet,extended release take 1 tablet by oral route 2 times every day     klor-con M20 68mEq ORAL TABLET take 1 tablet by oral route 3 times every day     Lasix 80 mg tablet take 1 tablet by oral route  every day     lisinopril 2.5 mg tablet take 1 tablet by oral route  every day     methimazole 10 mg tablet take 1 tablet by oral route  every day     metoprolol succinate ER 50 mg tablet,extended release 24 hr take 1 tablet by oral route  every day     Miralax 17 gram/dose oral powder take (17G)  by oral route  every day mixed with 8 oz. water, juice, soda, coffee or tea     Mirapex 0.5 mg tablet take 1 tablet by oral route 3 times every day  11/05/2017   multivitamin nature complete  ORAL      Myrbetriq 50 mg tablet,extended release take 1 tablet by oral route  every day swallowing  whole with water. Do not crush, chew and/or divide.     nitroglycerin 0.4 mg sublingual tablet place 1 tablet by sublingual route at 1st sign of attack; may repeat every 5 minutes up to 3 tabs; if norelief seek medical help     ProAir RespiClick 90 mcg/actuation breath activated inhale 2 puff by inhalation route  every 4 - 6 hours as needed     Tirosint 125 mcg capsule take 1 capsule by oral route  every day     tramadol 50 mg tablet take 1 tablet by oral route  every 6 hours as needed     Travatan Z 0.004 % eye drops instill 1 drop by ophthalmic route  every day into affected eye(s) in the evening     trihexyphenidyl 2 mg tablet take 1 tablet by oral route 3 times every day  11/05/2017   Victoza 2-Pak 0.6 mg/0.1 mL (18 mg/3 mL) subcutaneous pen injector inject (1.2MG )  by subcutaneous route  every day     Vitamin B 12  BUCCAL 172mcg     Vitamin D3  1,000 unit tablet        ALLERGIES: Ingredient Reaction Medication Name Comment  PENICILLINS         PHYSICAL EXAM:   Vitals Date Temp F BP Pulse Ht In Wt Lb BMI BSA Pain Score  11/05/2017  117/74 77 68 257 39.08  0/10      IMPRESSION:   Advised patient to delay his colonoscopy until after battery change - IPG battery change in right chest scheduled.  PLAN:  1. IPG battery change - right chest - scheduled 2. Follow-up after change   Assessment/Plan   # Detail Type Description   1. Assessment Paralysis agitans (G20).       2. Assessment End of battery life of deep brain stimulator (Z46.2).                     Provider:  Marchia Meiers. Vertell Limber MD  11/05/2017 05:57 PM Dictation edited by: Mirian Mo    CC Providers: [place] at [facility] [place],  [place]  [id number]-   James Love  [facility] [address] [place], [place] [address]-               Electronically signed by Marchia Meiers Vertell Limber MD on 11/09/2017 07:37 AM

## 2017-11-09 NOTE — Anesthesia Preprocedure Evaluation (Addendum)
Anesthesia Evaluation  Patient identified by MRN, date of birth, ID band Patient awake  General Assessment Comment:Pertinent hx includes chronic systolic heart failure, ICM s/p ICD implantation, CKD, OSA noncompliant with CPAP, IDDM with insulin pump, Parkinson's disease s/p DBS, CAD s/p remote CABG.  Reviewed: Allergy & Precautions, NPO status , Patient's Chart, lab work & pertinent test results, reviewed documented beta blocker date and time   History of Anesthesia Complications (+) PONV and history of anesthetic complications  Airway Mallampati: III  TM Distance: >3 FB Neck ROM: Full    Dental  (+) Dental Advisory Given, Chipped, Poor Dentition,    Pulmonary shortness of breath and with exertion, asthma , sleep apnea ,     + decreased breath sounds      Cardiovascular hypertension, Pt. on home beta blockers and Pt. on medications + CAD, + Past MI, + CABG and +CHF  Normal cardiovascular exam+ Cardiac Defibrillator  Rhythm:Regular Rate:Normal  Echo 08/13/13: Study Conclusions  - Left ventricle: The cavity size was mildly dilated. Wall thickness was normal. Systolic function was moderately toseverely reduced. The estimated ejection fraction was in therange of 30% to 35%. Probable akinesis and scarring of themid-apicalanteroseptal myocardium. The apex could not beevaluated. Wll motion analysis was challenging due to bodyhabitus. If detailed wall motion analysis and/or evaluation forLV apical thrombus is necessary, the study should be repeatedwith Definity microbubble contrast. - Left atrium: The atrium was moderately dilated.   Neuro/Psych PD s/p DBS implant  negative neurological ROS  negative psych ROS   GI/Hepatic Neg liver ROS, GERD  ,  Endo/Other  diabetes, Type 2, Insulin DependentHypothyroidism Hyperthyroidism Obesity   Renal/GU Renal InsufficiencyRenal disease     Musculoskeletal  (+) Arthritis ,   Abdominal (+) + obese,   Peds  Hematology  (+) Blood dyscrasia, anemia ,   Anesthesia Other Findings Day of surgery medications reviewed with the patient.  Reproductive/Obstetrics                          Anesthesia Physical Anesthesia Plan  ASA: IV  Anesthesia Plan: MAC   Post-op Pain Management:    Induction: Intravenous  PONV Risk Score and Plan: 2 and Propofol infusion and Treatment may vary due to age or medical condition  Airway Management Planned: Nasal Cannula, Natural Airway and Simple Face Mask  Additional Equipment:   Intra-op Plan:   Post-operative Plan:   Informed Consent: I have reviewed the patients History and Physical, chart, labs and discussed the procedure including the risks, benefits and alternatives for the proposed anesthesia with the patient or authorized representative who has indicated his/her understanding and acceptance.   Dental advisory given and Dental Advisory Given  Plan Discussed with: CRNA and Anesthesiologist  Anesthesia Plan Comments:      Anesthesia Quick Evaluation

## 2017-11-09 NOTE — Progress Notes (Addendum)
Anesthesia Chart Review:  Case:  [id number] Date/Time:  11/13/17 1130   Procedure:  Right chest implantable pulse generator change (Right ) - Right chest implantable pulse generator change   Anesthesia type:  General   Pre-op diagnosis:  Paralysis agitans   Location:  [facility] OR ROOM 21 / [facility]   Surgeon:  Erline Levine, MD      DISCUSSION: 67 yo male never smoker. Pertinent hx includes chronic systolic heart failure, ICM s/p ICD implantation, CKD, OSA noncompliant with CPAP, IDDM with insulin pump, Parkinson's disease s/p DBS, CAD s/p remote CABG.  Pt has been instructed on programming insulin pump and diabetes coordinator contacted.   Device form has been faxed, chart left for nurse followup.  Anticipate he can proceed as planned barring acute status change.  VS: BP 109/64   Pulse 78   Temp 36.8 C   Resp 20   Ht _0  (1.753 m)   Wt 114.9 kg   SpO2 97%   BMI 37.41 kg/m   PROVIDERS: Tonia Ghent, MD is PCP  Cristopher Peru, MD is EP cardiologist  [facility] provides nephrology care  Kirk Ruths, MD is Cardiologist  Elayne Snare, MD is Endocrinologist  LABS: Labs reviewed: Acceptable for surgery. Elevated creatinine c/w pt's CKD (all labs ordered are listed, but only abnormal results are displayed)  Labs Reviewed  BASIC METABOLIC PANEL - Abnormal; Notable for the following components:      Result Value   Glucose, Bld 101 (*)    Creatinine, Ser 1.61 (*)    Calcium 8.8 (*)    GFR calc non Af Amer 43 (*)    GFR calc Af Amer 50 (*)    All other components within normal limits  CBC - Abnormal; Notable for the following components:   Hemoglobin 12.5 (*)    All other components within normal limits  HEMOGLOBIN A1C - Abnormal; Notable for the following components:   Hgb A1c MFr Bld 7.2 (*)    All other components within normal limits  SURGICAL PCR SCREEN  GLUCOSE, CAPILLARY    EKG: 10/01/2017: Accelerated Junctional rhythm. Rate 75. Rightward axis. *On my review  there appear to be p-waves preceding QRS complexes, likely sinus rhythm.   CV: TTE 08/23/2013: Study Conclusions  - Left ventricle: The cavity size was mildly dilated. Wall thickness was normal. Systolic function was moderately to severely reduced. The estimated ejection fraction was in the range of 30% to 35%. Probable akinesis and scarring of the mid-apicalanteroseptal myocardium. The apex could not be evaluated. Wll motion analysis was challenging due to body habitus. If detailed wall motion analysis and/or evaluation for LV apical thrombus is necessary, the study should be repeated with Definity microbubble contrast. - Left atrium: The atrium was moderately dilated.  Myocardial perfusion imaging 06/17/2013: Overall Impression:  Low risk stress nuclear study.  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 No reversible ischemia.  LV Ejection Fraction: 41%.  LV Wall Motion:  Distal anteroseptal and apical hypokinesia  Past Medical History:  Diagnosis Date  . AICD (automatic cardioverter/defibrillator) present    Dr Lovena Le office visit yearly, MDT   . Arthritis    cane  . Asthma   . Benign neoplasm of colon   . CAD (coronary artery disease)   . Cardiomyopathy   . Cataract   . Complication of anesthesia    pt states that he got a rash  . Constipation   . 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 DM (diabetes mellitus) ([place])    TYPE 2   . GERD (gastroesophageal reflux disease)   . Glaucoma    right eye  . HLD (hyperlipidemia)   . HTN (hypertension)    pt denies 08/19/12  . Hyperplasia, prostate   . Hyperthyroidism    thyroid lobectomy  . MI (myocardial infarction) [facility])    Dr Stanford Breed 2000, x3vessels bypass  . Nephrolithiasis   . OSA (obstructive sleep apnea)    AHI-28,on CPAP, noncompliant with CPAP  . Parkinson disease ([place])    1999  . PONV (postoperative nausea and vomiting)   . Restless legs   .  Shortness of breath    Hx: of at all times  . Sleep apnea    does not use CPAP  . Sleep apnea, organic   . UTI (lower urinary tract infection) 09/15/12   Klebsiella  . Ventral hernia     Past Surgical History:  Procedure Laterality Date  . 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 CARDIAC CATHETERIZATION    . CATARACT EXTRACTION W/ INTRAOCULAR LENS IMPLANT     Hx: of right eye  . CATARACT EXTRACTION W/ INTRAOCULAR LENS IMPLANT Left 2018  . COLONOSCOPY N/A 10/13/2014   Procedure: COLONOSCOPY;  Surgeon: Jerene Bears, MD;  Location: [facility] ENDOSCOPY;  Service: Gastroenterology;  Laterality: N/A;  . COLONOSCOPY W/ BIOPSIES AND POLYPECTOMY     Hx: of  . CORONARY ARTERY BYPASS GRAFT  2000   [name], MD  . [place]  . DEEP BRAIN STIMULATOR PLACEMENT  2004   Right and left VIN stimulator placement (parkinsons)  . EYE SURGERY    . FINGER AMPUTATION     left pointer  . IMPLANTABLE CARDIOVERTER DEFIBRILLATOR IMPLANT N/A 11/13/2013   Procedure: IMPLANTABLE CARDIOVERTER DEFIBRILLATOR IMPLANT;  Surgeon: Evans Lance, MD;  Location: [facility] LAB;  Service: Cardiovascular;  Laterality: N/A;  . INSERT / REPLACE / REMOVE PACEMAKER    . LEFT AND RIGHT HEART CATHETERIZATION WITH CORONARY ANGIOGRAM N/A 09/24/2013   Procedure: LEFT AND RIGHT HEART CATHETERIZATION WITH CORONARY ANGIOGRAM;  Surgeon: Burnell Blanks, MD;  Location: [facility] CATH LAB;  Service: Cardiovascular;  Laterality: N/A;  . LITHOTRIPSY     3 different times  . MEDIAN STERNOTOMY  2000  . SUBTHALAMIC STIMULATOR BATTERY REPLACEMENT N/A 09/05/2012   Procedure: Deep brain stimulator battery change;  Surgeon: Erline Levine, MD;  Location: [facility];  Service: Neurosurgery;  Laterality: N/A;  Deep brain stimulator battery change  . SUBTHALAMIC STIMULATOR BATTERY REPLACEMENT N/A 06/10/2015   Procedure: Deep Brain stimulator battery change;  Surgeon: Erline Levine, MD;  Location: [facility];  Service:  Neurosurgery;  Laterality: N/A;  . TONSILLECTOMY      MEDICATIONS: . acetaminophen (TYLENOL) 500 MG tablet  . albuterol (PROVENTIL HFA;VENTOLIN HFA) 108 (90 Base) MCG/ACT inhaler  . allopurinol (ZYLOPRIM) 100 MG tablet  . AMBULATORY NON FORMULARY MEDICATION  . aspirin EC 81 MG tablet  . Carbidopa-Levodopa ER (SINEMET CR) 25-100 MG tablet controlled release  . cholecalciferol (VITAMIN D) 1000 units tablet  . colchicine 0.6 MG tablet  . ferrous sulfate 325 (65 FE) MG EC tablet  . Fluticasone-Salmeterol (ADVAIR) 250-50 MCG/DOSE AEPB  . furosemide (LASIX) 80 MG tablet  . gabapentin (NEURONTIN) 100 MG capsule  . Insulin Disposable Pump (V-GO 40) KIT  . Insulin Glargine (LANTUS SOLOSTAR) 100 UNIT/ML Solostar Pen  . insulin lispro (  HUMALOG) 100 UNIT/ML injection  . KLOR-CON M20 20 MEQ tablet  . levothyroxine (SYNTHROID, LEVOTHROID) 125 MCG tablet  . lisinopril (PRINIVIL,ZESTRIL) 2.5 MG tablet  . metolazone (ZAROXOLYN) 2.5 MG tablet  . metoprolol succinate (TOPROL-XL) 50 MG 24 hr tablet  . Multiple Vitamin (MULTIVITAMIN WITH MINERALS) TABS tablet  . MYRBETRIQ 50 MG TB24 tablet  . nitroGLYCERIN (NITROSTAT) 0.4 MG SL tablet  . ONETOUCH VERIO test strip  . OVER THE COUNTER MEDICATION  . polyethylene glycol (MIRALAX / GLYCOLAX) packet  . rosuvastatin (CRESTOR) 20 MG tablet  . traMADol (ULTRAM) 50 MG tablet  . travoprost, benzalkonium, (TRAVATAN) 0.004 % ophthalmic solution  . VICTOZA 18 MG/3ML SOPN  . vitamin B-12 (CYANOCOBALAMIN) 1000 MCG tablet   No current facility-administered medications for this encounter.     Wynonia Musty [facility] Short Stay Center/Anesthesiology Phone [phone] 11/09/2017 11:01 AM

## 2017-11-10 LAB — FRUCTOSAMINE: FRUCTOSAMINE: 277 umol/L (ref 0–285)

## 2017-11-12 ENCOUNTER — Ambulatory Visit (HOSPITAL_COMMUNITY): Admission: RE | Admit: 2017-11-12 | Payer: Medicare Other | Source: Ambulatory Visit | Admitting: Gastroenterology

## 2017-11-12 ENCOUNTER — Encounter (HOSPITAL_COMMUNITY): Admission: RE | Payer: Self-pay | Source: Ambulatory Visit

## 2017-11-12 LAB — C-PEPTIDE: C-Peptide: 2.24 ng/mL (ref 0.80–3.85)

## 2017-11-12 SURGERY — COLONOSCOPY WITH PROPOFOL
Anesthesia: Monitor Anesthesia Care

## 2017-11-12 MED ORDER — VANCOMYCIN HCL 10 G IV SOLR
1500.0000 mg | INTRAVENOUS | Status: AC
  Administered 2017-11-13 (×2): 1500 mg via INTRAVENOUS
  Filled 2017-11-12: qty 1500

## 2017-11-13 ENCOUNTER — Encounter (HOSPITAL_COMMUNITY)
Admission: RE | Disposition: A | Discharge status: Home / Self Care | Payer: Self-pay | Source: Ambulatory Visit | Attending: Neurosurgery

## 2017-11-13 ENCOUNTER — Ambulatory Visit (HOSPITAL_COMMUNITY): Payer: Medicare Other | Admitting: Physician Assistant

## 2017-11-13 ENCOUNTER — Ambulatory Visit (HOSPITAL_COMMUNITY)
Admission: RE | Admit: 2017-11-13 | Discharge: 2017-11-13 | Disposition: A | Discharge status: Home / Self Care | Payer: Medicare Other | Source: Ambulatory Visit | Attending: Neurosurgery | Admitting: Neurosurgery

## 2017-11-13 ENCOUNTER — Encounter (HOSPITAL_COMMUNITY): Payer: Self-pay

## 2017-11-13 ENCOUNTER — Ambulatory Visit (HOSPITAL_COMMUNITY): Payer: Medicare Other | Admitting: Certified Registered"

## 2017-11-13 DIAGNOSIS — Z86010 Personal history of colonic polyps: Secondary | ICD-10-CM

## 2017-11-13 DIAGNOSIS — I5043 Acute on chronic combined systolic (congestive) and diastolic (congestive) heart failure: Secondary | ICD-10-CM | POA: Diagnosis not present

## 2017-11-13 DIAGNOSIS — G20 Parkinson's disease: Secondary | ICD-10-CM | POA: Insufficient documentation

## 2017-11-13 DIAGNOSIS — Z79899 Other long term (current) drug therapy: Secondary | ICD-10-CM | POA: Diagnosis not present

## 2017-11-13 DIAGNOSIS — Z880 Allergy status to penicillin: Secondary | ICD-10-CM | POA: Diagnosis not present

## 2017-11-13 DIAGNOSIS — N183 Chronic kidney disease, stage 3 (moderate): Secondary | ICD-10-CM | POA: Diagnosis not present

## 2017-11-13 DIAGNOSIS — I130 Hypertensive heart and chronic kidney disease with heart failure and stage 1 through stage 4 chronic kidney disease, or unspecified chronic kidney disease: Secondary | ICD-10-CM | POA: Diagnosis not present

## 2017-11-13 DIAGNOSIS — Z7982 Long term (current) use of aspirin: Secondary | ICD-10-CM | POA: Diagnosis not present

## 2017-11-13 DIAGNOSIS — Z462 Encounter for fitting and adjustment of other devices related to nervous system and special senses: Secondary | ICD-10-CM | POA: Insufficient documentation

## 2017-11-13 DIAGNOSIS — Z7984 Long term (current) use of oral hypoglycemic drugs: Secondary | ICD-10-CM | POA: Insufficient documentation

## 2017-11-13 HISTORY — PX: PULSE GENERATOR IMPLANT: SHX5370

## 2017-11-13 LAB — GLUCOSE, CAPILLARY
GLUCOSE-CAPILLARY: 144 mg/dL — AB (ref 70–99)
Glucose-Capillary: 115 mg/dL — ABNORMAL HIGH (ref 70–99)

## 2017-11-13 SURGERY — UNILATERAL PULSE GENERATOR IMPLANT
Anesthesia: Monitor Anesthesia Care | Laterality: Right

## 2017-11-13 MED ORDER — PROPOFOL 10 MG/ML IV BOLUS
INTRAVENOUS | Status: AC
  Filled 2017-11-13: qty 20

## 2017-11-13 MED ORDER — SUCCINYLCHOLINE CHLORIDE 200 MG/10ML IV SOSY
PREFILLED_SYRINGE | INTRAVENOUS | Status: AC
  Filled 2017-11-13: qty 10

## 2017-11-13 MED ORDER — ONDANSETRON HCL 4 MG/2ML IJ SOLN
INTRAMUSCULAR | Status: DC | PRN
  Administered 2017-11-13: 4 mg via INTRAVENOUS

## 2017-11-13 MED ORDER — PROPOFOL 10 MG/ML IV BOLUS
INTRAVENOUS | Status: DC | PRN
  Administered 2017-11-13 (×2): 10 mg via INTRAVENOUS

## 2017-11-13 MED ORDER — SODIUM CHLORIDE 0.9 % IV SOLN
INTRAVENOUS | Status: DC
  Administered 2017-11-13: 10:00:00 via INTRAVENOUS

## 2017-11-13 MED ORDER — 0.9 % SODIUM CHLORIDE (POUR BTL) OPTIME
TOPICAL | Status: DC | PRN
  Administered 2017-11-13: 1000 mL

## 2017-11-13 MED ORDER — ETOMIDATE 2 MG/ML IV SOLN
INTRAVENOUS | Status: AC
  Filled 2017-11-13: qty 10

## 2017-11-13 MED ORDER — PROPOFOL 500 MG/50ML IV EMUL
INTRAVENOUS | Status: DC | PRN
  Administered 2017-11-13: 55 ug/kg/min via INTRAVENOUS

## 2017-11-13 MED ORDER — BACITRACIN ZINC 500 UNIT/GM EX OINT
TOPICAL_OINTMENT | CUTANEOUS | Status: AC
  Filled 2017-11-13: qty 28.35

## 2017-11-13 MED ORDER — MIDAZOLAM HCL 2 MG/2ML IJ SOLN
INTRAMUSCULAR | Status: DC | PRN
  Administered 2017-11-13: 1 mg via INTRAVENOUS

## 2017-11-13 MED ORDER — FENTANYL CITRATE (PF) 100 MCG/2ML IJ SOLN: 25.0000 ug | INTRAMUSCULAR | Status: DC | PRN

## 2017-11-13 MED ORDER — DEXAMETHASONE SODIUM PHOSPHATE 10 MG/ML IJ SOLN
INTRAMUSCULAR | Status: AC
  Filled 2017-11-13: qty 1

## 2017-11-13 MED ORDER — ROCURONIUM BROMIDE 50 MG/5ML IV SOSY
PREFILLED_SYRINGE | INTRAVENOUS | Status: AC
  Filled 2017-11-13: qty 5

## 2017-11-13 MED ORDER — LIDOCAINE-EPINEPHRINE 1 %-1:100000 IJ SOLN
INTRAMUSCULAR | Status: AC
  Filled 2017-11-13: qty 1

## 2017-11-13 MED ORDER — FENTANYL CITRATE (PF) 250 MCG/5ML IJ SOLN
INTRAMUSCULAR | Status: AC
  Filled 2017-11-13: qty 5

## 2017-11-13 MED ORDER — LIDOCAINE 2% (20 MG/ML) 5 ML SYRINGE
INTRAMUSCULAR | Status: AC
  Filled 2017-11-13: qty 5

## 2017-11-13 MED ORDER — PHENYLEPHRINE 40 MCG/ML (10ML) SYRINGE FOR IV PUSH (FOR BLOOD PRESSURE SUPPORT)
PREFILLED_SYRINGE | INTRAVENOUS | Status: DC | PRN
  Administered 2017-11-13 (×3): 80 ug via INTRAVENOUS

## 2017-11-13 MED ORDER — ONDANSETRON HCL 4 MG/2ML IJ SOLN
INTRAMUSCULAR | Status: AC
  Filled 2017-11-13: qty 2

## 2017-11-13 MED ORDER — LIDOCAINE 2% (20 MG/ML) 5 ML SYRINGE
INTRAMUSCULAR | Status: DC | PRN
  Administered 2017-11-13: 20 mg via INTRAVENOUS

## 2017-11-13 MED ORDER — LIDOCAINE-EPINEPHRINE 1 %-1:100000 IJ SOLN
INTRAMUSCULAR | Status: DC | PRN
  Administered 2017-11-13: 9 mL

## 2017-11-13 MED ORDER — BUPIVACAINE HCL (PF) 0.5 % IJ SOLN
INTRAMUSCULAR | Status: AC
  Filled 2017-11-13: qty 30

## 2017-11-13 MED ORDER — VANCOMYCIN HCL 1000 MG IV SOLR
INTRAVENOUS | Status: AC
  Filled 2017-11-13: qty 1000

## 2017-11-13 MED ORDER — MIDAZOLAM HCL 2 MG/2ML IJ SOLN
INTRAMUSCULAR | Status: AC
  Filled 2017-11-13: qty 2

## 2017-11-13 MED ORDER — BUPIVACAINE HCL (PF) 0.5 % IJ SOLN
INTRAMUSCULAR | Status: DC | PRN
  Administered 2017-11-13: 9 mL

## 2017-11-13 MED ORDER — ONDANSETRON HCL 4 MG/2ML IJ SOLN: 4.0000 mg | Freq: Once | INTRAMUSCULAR | Status: DC | PRN

## 2017-11-13 MED ORDER — PHENYLEPHRINE 40 MCG/ML (10ML) SYRINGE FOR IV PUSH (FOR BLOOD PRESSURE SUPPORT)
PREFILLED_SYRINGE | INTRAVENOUS | Status: AC
  Filled 2017-11-13: qty 10

## 2017-11-13 MED ORDER — FENTANYL CITRATE (PF) 250 MCG/5ML IJ SOLN
INTRAMUSCULAR | Status: DC | PRN
  Administered 2017-11-13 (×2): 25 ug via INTRAVENOUS

## 2017-11-13 MED ORDER — VANCOMYCIN HCL 1000 MG IV SOLR
INTRAVENOUS | Status: DC | PRN
  Administered 2017-11-13: 1000 mg via TOPICAL

## 2017-11-13 SURGICAL SUPPLY — 42 items
ADH SKN CLS APL DERMABOND .7 (GAUZE/BANDAGES/DRESSINGS) ×1
BANDAGE ADH SHEER 1  50/CT (GAUZE/BANDAGES/DRESSINGS) IMPLANT
BLADE CLIPPER SURG (BLADE) ×1 IMPLANT
BOOT SUTURE AID YELLOW STND (SUTURE) IMPLANT
CANISTER SUCT 3000ML PPV (MISCELLANEOUS) ×2 IMPLANT
CLIP RANEY DISP (INSTRUMENTS) IMPLANT
COVER BACK TABLE 60X90IN (DRAPES) ×1 IMPLANT
COVER WAND RF STERILE (DRAPES) ×1 IMPLANT
DECANTER SPIKE VIAL GLASS SM (MISCELLANEOUS) ×2 IMPLANT
DERMABOND ADVANCED (GAUZE/BANDAGES/DRESSINGS) ×1
DERMABOND ADVANCED .7 DNX12 (GAUZE/BANDAGES/DRESSINGS) ×1 IMPLANT
DRAPE C-ARM 42X72 X-RAY (DRAPES) IMPLANT
DRAPE CAMERA CLOSED 9X96 (DRAPES) ×2 IMPLANT
DRAPE ORTHO SPLIT 77X108 STRL (DRAPES) ×4
DRAPE SURG ORHT 6 SPLT 77X108 (DRAPES) ×2 IMPLANT
DRSG OPSITE POSTOP 4X6 (GAUZE/BANDAGES/DRESSINGS) ×1 IMPLANT
DRSG TELFA 3X8 NADH (GAUZE/BANDAGES/DRESSINGS) ×2 IMPLANT
DURAPREP 26ML APPLICATOR (WOUND CARE) ×2 IMPLANT
GAUZE 4X4 16PLY RFD (DISPOSABLE) ×1 IMPLANT
GAUZE SPONGE 4X4 12PLY STRL (GAUZE/BANDAGES/DRESSINGS) IMPLANT
GLOVE BIO SURGEON STRL SZ8 (GLOVE) ×2 IMPLANT
GLOVE ECLIPSE 8.0 STRL XLNG CF (GLOVE) ×2 IMPLANT
GLOVE INDICATOR 8.0 STRL GRN (GLOVE) ×2 IMPLANT
GLOVE INDICATOR 8.5 STRL (GLOVE) ×2 IMPLANT
KIT BASIN OR (CUSTOM PROCEDURE TRAY) ×2 IMPLANT
KIT REMOVER STAPLE SKIN (MISCELLANEOUS) ×2 IMPLANT
MARKER SKIN DUAL TIP RULER LAB (MISCELLANEOUS) ×2 IMPLANT
NDL HYPO 25X1 1.5 SAFETY (NEEDLE) ×1 IMPLANT
NDL SPNL 18GX3.5 QUINCKE PK (NEEDLE) IMPLANT
NEEDLE HYPO 25X1 1.5 SAFETY (NEEDLE) ×2 IMPLANT
NEEDLE SPNL 18GX3.5 QUINCKE PK (NEEDLE) IMPLANT
NEUROSTIM OCTOPOLAR ~~LOC~~ 49X65 (Neuro Prosthesis/Implant) ×1 IMPLANT
NEUROSTIM PROGRAMMER 2.2X3.7 (NEUROSURGERY SUPPLIES) ×1 IMPLANT
PACK LAMINECTOMY NEURO (CUSTOM PROCEDURE TRAY) ×2 IMPLANT
PAD ARMBOARD 7.5X6 YLW CONV (MISCELLANEOUS) ×2 IMPLANT
PAD DRESSING TELFA 3X8 NADH (GAUZE/BANDAGES/DRESSINGS) ×1 IMPLANT
STAPLER SKIN PROX WIDE 3.9 (STAPLE) ×2 IMPLANT
SUT ETHILON 3 0 PS 1 (SUTURE) IMPLANT
SUT SILK 2 0 TIES 10X30 (SUTURE) ×2 IMPLANT
SUT VIC AB 2-0 CP2 18 (SUTURE) ×3 IMPLANT
SUT VIC AB 2-0 CT2 18 VCP726D (SUTURE) ×2 IMPLANT
SUT VIC AB 3-0 SH 8-18 (SUTURE) ×2 IMPLANT

## 2017-11-13 NOTE — Progress Notes (Signed)
Writer spoke with the [employer], Erlene Quan and he states pt's AICD does not need to be interrogated before discharge home.

## 2017-11-13 NOTE — Anesthesia Procedure Notes (Signed)
Procedure Name: MAC Date/Time: 11/13/2017 11:44 AM Performed by: Neldon Newport, CRNA Pre-anesthesia Checklist: Patient identified, Emergency Drugs available, Suction available, Patient being monitored and Timeout performed Patient Re-evaluated:Patient Re-evaluated prior to induction Oxygen Delivery Method: Simple face mask Induction Type: IV induction

## 2017-11-13 NOTE — Op Note (Signed)
11/13/2017  12:55 PM  PATIENT:  Henry [name]  67 y.o. male  PRE-OPERATIVE DIAGNOSIS:  Paralysis agitans  POST-OPERATIVE DIAGNOSIS:  paralysis agitans  PROCEDURE:  Procedure(s) with comments: Right chest implantable pulse generator change (Right) - Right chest implantable pulse generator change  SURGEON:  Surgeon(s) and Role:    Erline Levine, MD - Primary  PHYSICIAN ASSISTANT:   ASSISTANTS: Poteat, RN   ANESTHESIA:   local and IV sedation  EBL:  5 mL   BLOOD ADMINISTERED:none  DRAINS: none   LOCAL MEDICATIONS USED:  MARCAINE    and LIDOCAINE   SPECIMEN:  No Specimen  DISPOSITION OF SPECIMEN:  N/A  COUNTS:  YES  TOURNIQUET:  * No tourniquets in log *  DICTATION: DICTATION: Patient has implanted subthalamic stimulator electrodes and IPG, which is now depleted.  It was elected for patient to undergo IPG revision.  PROCEDURE: Patient was brought to the operating room and given intravenous sedation.  Right upper chest was prepped with betadine scrub and Duraprep.  Area of planned incision was infiltrated with lidocaine.  Prior incision was reopened and the old IPG was externalized.  Adaptor was reconnected to new IPG which was placed in the pocket.  Wound was irrigated with vancomycin. Then irrigated once more.  Incision was closed with 2-0 Vicryl and 3-0 vicryl sutures and dressed with Dermabond and a sterile occlusive dressing.  Impedances were correct with the new device for all contacts.  Counts were correct at the end of the case.  PLAN OF CARE: Discharge to home after PACU  PATIENT DISPOSITION:  PACU - hemodynamically stable.   Delay start of Pharmacological VTE agent (>24hrs) due to surgical blood loss or risk of bleeding: yes

## 2017-11-13 NOTE — Telephone Encounter (Signed)
New form faxed with the updated information such as c-peptide and last A1C result

## 2017-11-13 NOTE — Transfer of Care (Signed)
Immediate Anesthesia Transfer of Care Note  Patient: Henry [name]  Procedure(s) Performed: Right chest implantable pulse generator change (Right )  Patient Location: PACU  Anesthesia Type:MAC  Level of Consciousness: awake, alert  and oriented  Airway & Oxygen Therapy: Patient Spontanous Breathing and Patient connected to face mask oxygen  Post-op Assessment: Report given to RN, Post -op Vital signs reviewed and stable and Patient moving all extremities X 4  Post vital signs: Reviewed and stable  Last Vitals:  Vitals Value Taken Time  BP    Temp    Pulse    Resp    SpO2      Last Pain:  Vitals:   11/13/17 1020  TempSrc:   PainSc: 0-No pain      Patients Stated Pain Goal: 0 (32/99/24 2683)  Complications: No apparent anesthesia complications

## 2017-11-13 NOTE — Progress Notes (Signed)
Called and spoke with Erlene Quan (med[employer] rep) and informed him of patient's procedure today, he said someone would be here to check device prior to surgery and he believes using a magnet will be ok at this time

## 2017-11-13 NOTE — Anesthesia Postprocedure Evaluation (Signed)
Anesthesia Post Note  Patient: Henry [name]  Procedure(s) Performed: Right chest implantable pulse generator change (Right )     Patient location during evaluation: PACU Anesthesia Type: MAC Level of consciousness: awake and alert Pain management: pain level controlled Vital Signs Assessment: post-procedure vital signs reviewed and stable Respiratory status: spontaneous breathing, nonlabored ventilation, respiratory function stable and patient connected to nasal cannula oxygen Cardiovascular status: stable and blood pressure returned to baseline Postop Assessment: no apparent nausea or vomiting Anesthetic complications: no    Last Vitals:  Vitals:   11/13/17 1345 11/13/17 1359  BP: 110/74 109/76  Pulse: 64 67  Resp: 17 18  Temp: (!) 36.4 C   SpO2: 100% 99%    Last Pain:  Vitals:   11/13/17 1359  TempSrc:   PainSc: 0-No pain                 Effie Berkshire

## 2017-11-13 NOTE — Brief Op Note (Signed)
11/13/2017  12:55 PM  PATIENT:  Henry [name]  67 y.o. male  PRE-OPERATIVE DIAGNOSIS:  Paralysis agitans  POST-OPERATIVE DIAGNOSIS:  paralysis agitans  PROCEDURE:  Procedure(s) with comments: Right chest implantable pulse generator change (Right) - Right chest implantable pulse generator change  SURGEON:  Surgeon(s) and Role:    Erline Levine, MD - Primary  PHYSICIAN ASSISTANT:   ASSISTANTS: Poteat, RN   ANESTHESIA:   local and IV sedation  EBL:  5 mL   BLOOD ADMINISTERED:none  DRAINS: none   LOCAL MEDICATIONS USED:  MARCAINE    and LIDOCAINE   SPECIMEN:  No Specimen  DISPOSITION OF SPECIMEN:  N/A  COUNTS:  YES  TOURNIQUET:  * No tourniquets in log *  DICTATION: DICTATION: Patient has implanted subthalamic stimulator electrodes and IPG, which is now depleted.  It was elected for patient to undergo IPG revision.  PROCEDURE: Patient was brought to the operating room and given intravenous sedation.  Right upper chest was prepped with betadine scrub and Duraprep.  Area of planned incision was infiltrated with lidocaine.  Prior incision was reopened and the old IPG was externalized.  Adaptor was reconnected to new IPG which was placed in the pocket.  Wound was irrigated with vancomycin. Then irrigated once more.  Incision was closed with 2-0 Vicryl and 3-0 vicryl sutures and dressed with Dermabond and a sterile occlusive dressing.  Impedances were correct with the new device for all contacts.  Counts were correct at the end of the case.  PLAN OF CARE: Discharge to home after PACU  PATIENT DISPOSITION:  PACU - hemodynamically stable.   Delay start of Pharmacological VTE agent (>24hrs) due to surgical blood loss or risk of bleeding: yes

## 2017-11-14 ENCOUNTER — Encounter: Payer: Self-pay | Admitting: Family Medicine

## 2017-11-14 ENCOUNTER — Encounter (HOSPITAL_COMMUNITY): Payer: Self-pay | Admitting: Neurosurgery

## 2017-11-15 ENCOUNTER — Telehealth: Payer: Self-pay | Admitting: *Deleted

## 2017-11-15 MED ORDER — GABAPENTIN 100 MG PO CAPS: ORAL_CAPSULE | ORAL | Status: DC

## 2017-11-15 NOTE — Telephone Encounter (Signed)
Spoke to pts wife who states pt is c/o of bilateral foot pain. He has been taking gabapentin; 1 cap in the am and 3caps at bedtime, with "some relief," but states that most of the pt complaints are at night. He recently had surgery and was advised that he is at high risk for infection, so she didn't know if it would be a good idea to bring him to an OV. Pt has also being using at home remedies; eating mustard and drinking pickle juice. She is wanting to know if she is needing to increase meds as suggested by Dr Damita Dunnings, 2 tabs TID, or if OV is required. pls advise

## 2017-11-15 NOTE — Telephone Encounter (Signed)
Jiles Garter called from [facility] and advised that letter of authorization was incomplete.  Contact information if needed is [phone] ext [phone]

## 2017-11-15 NOTE — Telephone Encounter (Signed)
Patient advised.

## 2017-11-15 NOTE — Telephone Encounter (Signed)
I'm glad to see him if that will be helpful.   If he had visible changes to the feet (new redness, ulcer, drainage), then we need to check him.   If no changes visible and the pain is the same pain as prev (but just worse), then would try to increase gabapentin to 100-200 mg by mouth in the morning and 300-400 mg by mouth at bedtime.    See if that helps and update me as needed.   Thanks.

## 2017-11-17 LAB — CUP PACEART REMOTE DEVICE CHECK
Brady Statistic AP VP Percent: 0 %
Brady Statistic AP VS Percent: 0.18 %
Brady Statistic AS VP Percent: 0.03 %
Brady Statistic AS VS Percent: 99.78 %
Brady Statistic RA Percent Paced: 0.18 %
Brady Statistic RV Percent Paced: 0.04 %
HighPow Impedance: 75 Ohm
Implantable Lead Implant Date: 20151105
Implantable Lead Location: 753859
Implantable Pulse Generator Implant Date: 20151105
Lead Channel Impedance Value: 399 Ohm
Lead Channel Impedance Value: 437 Ohm
Lead Channel Pacing Threshold Amplitude: 0.375 V
Lead Channel Pacing Threshold Amplitude: 0.875 V
Lead Channel Pacing Threshold Pulse Width: 0.4 ms
Lead Channel Sensing Intrinsic Amplitude: 17.75 mV
Lead Channel Sensing Intrinsic Amplitude: 17.75 mV
Lead Channel Sensing Intrinsic Amplitude: 2.25 mV
Lead Channel Setting Pacing Amplitude: 2 V
Lead Channel Setting Pacing Amplitude: 2.5 V
Lead Channel Setting Sensing Sensitivity: 0.3 mV
MDC IDC LEAD IMPLANT DT: 20151105
MDC IDC LEAD LOCATION: 753860
MDC IDC MSMT BATTERY REMAINING LONGEVITY: 83 mo
MDC IDC MSMT BATTERY VOLTAGE: 2.99 V
MDC IDC MSMT LEADCHNL RA PACING THRESHOLD PULSEWIDTH: 0.4 ms
MDC IDC MSMT LEADCHNL RA SENSING INTR AMPL: 2.25 mV
MDC IDC MSMT LEADCHNL RV IMPEDANCE VALUE: 494 Ohm
MDC IDC SESS DTM: 20191016133525
MDC IDC SET LEADCHNL RV PACING PULSEWIDTH: 0.4 ms

## 2017-11-19 ENCOUNTER — Encounter: Payer: Medicare Other | Admitting: Neurology

## 2017-11-19 ENCOUNTER — Encounter: Payer: Medicare Other | Admitting: Nutrition

## 2017-11-20 ENCOUNTER — Ambulatory Visit: Payer: Medicare Other | Admitting: Endocrinology

## 2017-11-20 ENCOUNTER — Other Ambulatory Visit: Payer: Medicare Other

## 2017-11-20 ENCOUNTER — Telehealth: Payer: Self-pay | Admitting: Endocrinology

## 2017-11-20 NOTE — Telephone Encounter (Signed)
This has been done.

## 2017-11-20 NOTE — Telephone Encounter (Signed)
[facility] ph# [phone], ext [phone] called re: They faxed a request for a [place] 14 day reader and sensors-they need the questions (Medical Necessity) on the form filled out. Fax# [phone].

## 2017-11-21 ENCOUNTER — Other Ambulatory Visit: Payer: Self-pay | Admitting: Endocrinology

## 2017-11-21 ENCOUNTER — Ambulatory Visit: Payer: Medicare Other | Admitting: Endocrinology

## 2017-11-22 ENCOUNTER — Ambulatory Visit: Payer: Medicare Other | Admitting: Endocrinology

## 2017-11-25 ENCOUNTER — Other Ambulatory Visit: Payer: Self-pay | Admitting: Family Medicine

## 2017-11-27 ENCOUNTER — Telehealth: Payer: Self-pay | Admitting: Endocrinology

## 2017-11-27 NOTE — Telephone Encounter (Signed)
Questions were answered about insulin for the pump start on Monday.  Pt. Was told to charge his pump before coming, and to wear his V-go into the office on that day.

## 2017-11-27 NOTE — Telephone Encounter (Signed)
Patients wife has called in regards to a questions about the [employer]. Please Advise, thanks.

## 2017-11-28 ENCOUNTER — Telehealth: Payer: Self-pay | Admitting: Neurology

## 2017-11-28 ENCOUNTER — Other Ambulatory Visit: Payer: Medicare Other

## 2017-11-28 NOTE — Telephone Encounter (Signed)
Patient was seen by Dr. Vertell Limber today 11/28/17 and was advised by Dr. Vertell Limber to follow up with Dr. Carles Collet  To make sure settings are correct. He is scheduled for February and on the wait list. Should he be seen sooner? Please Advise. Thanks

## 2017-11-28 NOTE — Telephone Encounter (Signed)
Dr. Carles Collet - please advise.

## 2017-11-29 DIAGNOSIS — H35341 Macular cyst, hole, or pseudohole, right eye: Secondary | ICD-10-CM | POA: Diagnosis not present

## 2017-11-29 DIAGNOSIS — H4030X1 Glaucoma secondary to eye trauma, unspecified eye, mild stage: Secondary | ICD-10-CM | POA: Diagnosis not present

## 2017-11-29 DIAGNOSIS — H35033 Hypertensive retinopathy, bilateral: Secondary | ICD-10-CM | POA: Diagnosis not present

## 2017-11-29 DIAGNOSIS — H40032 Anatomical narrow angle, left eye: Secondary | ICD-10-CM | POA: Diagnosis not present

## 2017-11-29 NOTE — Telephone Encounter (Signed)
Spoke with patient's wife. She does state Henry [name] was there and set the device. I advised them to keep scheduled appt and call with any issues prior to that if needed. She expressed understanding.

## 2017-11-29 NOTE — Telephone Encounter (Signed)
What makes him think that he isn't set up correctly?  Was the med[employer] rep there after the battery change?  They usually set it back up.  I can check with them if he doesn't know.  They usually just put it back to the settings I had

## 2017-11-30 ENCOUNTER — Telehealth: Payer: Self-pay | Admitting: Endocrinology

## 2017-11-30 NOTE — Telephone Encounter (Signed)
[facility] ph# [phone], ext [phone] called re: They faxed a request for a [place] 14 day reader and sensors-they need the questions (Medical Necessity) on the form filled out. As of today 11/30/17 the form they have on file does not have the Medical Necessity questions completed.  They are requesting that we resend the completed form to Fax# [phone].

## 2017-12-03 ENCOUNTER — Encounter: Payer: Medicare Other | Attending: Endocrinology | Admitting: Nutrition

## 2017-12-03 ENCOUNTER — Ambulatory Visit: Payer: Medicare Other | Admitting: Endocrinology

## 2017-12-03 DIAGNOSIS — E1121 Type 2 diabetes mellitus with diabetic nephropathy: Secondary | ICD-10-CM

## 2017-12-03 DIAGNOSIS — Z794 Long term (current) use of insulin: Secondary | ICD-10-CM

## 2017-12-03 NOTE — Telephone Encounter (Signed)
Called and spoke with rep and they will be faxing paperwork to be filled out to [phone].

## 2017-12-03 NOTE — Telephone Encounter (Signed)
Do you know if this paperwork has been completed

## 2017-12-03 NOTE — Telephone Encounter (Signed)
lft vm for ext associated with previous phone note informing of faxed paperwork on 11/02/17 and asking to re-fax paperwork if it was not complete

## 2017-12-03 NOTE — Telephone Encounter (Signed)
Forwarding

## 2017-12-04 ENCOUNTER — Ambulatory Visit (INDEPENDENT_AMBULATORY_CARE_PROVIDER_SITE_OTHER): Payer: Medicare Other | Admitting: Endocrinology

## 2017-12-04 ENCOUNTER — Encounter: Payer: Medicare Other | Admitting: Nutrition

## 2017-12-04 ENCOUNTER — Encounter: Payer: Self-pay | Admitting: Endocrinology

## 2017-12-04 VITALS — Ht 69.0 in

## 2017-12-04 DIAGNOSIS — Z794 Long term (current) use of insulin: Secondary | ICD-10-CM | POA: Diagnosis not present

## 2017-12-04 DIAGNOSIS — I255 Ischemic cardiomyopathy: Secondary | ICD-10-CM | POA: Diagnosis not present

## 2017-12-04 DIAGNOSIS — E1165 Type 2 diabetes mellitus with hyperglycemia: Secondary | ICD-10-CM

## 2017-12-04 NOTE — Progress Notes (Signed)
Patient ID: Henry [name], male   DOB: 01-Jun-1950, 67 y.o.   MRN: [id number]           Reason for Appointment: Follow-up for Type 2 Diabetes   History of Present Illness:          Diagnosis: Type 2 diabetes mellitus, date of diagnosis: 2000      Past history:   Patient thinks he has been taking Amaryl for several years and probably metformin since onset also At some point he was changed from metformin to [facility] and was taking this since at least 2012 A1c had been higher in 2015 Since his A1c had been progressively higher with his regimen of Janumet and Amaryl he was started on Victoza in 01/2014 He was started on Lantus insulin in 5/16 because of persistent hyperglycemia especially fasting; was having readings as high as 293 Because of tendency to high postprandial readings and for control with Lantus he was switched to the V-go pump in  09/2014  Recent history:   INSULIN regimen: Omnipod insulin pump Basal rate setting 1.8 Boluses 4 units for breakfast and lunch and 8 units for dinner  Previous regimen V-go-40 pump and boluses 4 units.  Lantus 10 units at bedtime  Non-insulin hypoglycemic drugs the patient is taking are:  , Victoza 1.8 mg daily in am   His A1c was  8.4 previously and last month down to 7.2 done before his surgery  Current management, blood sugar patterns and problems identified:  He has just started the Omnipod insulin pump  Most of the home management is being done by his wife  Blood sugar at the start of the pump was 225 yesterday morning  At lunchtime blood sugar was 150 but he did not do a reading after eating; today before lunch blood sugar was 184 and this afternoon 154  Blood sugar at dinnertime was 133 and 4 hours later was 191 but he had a relatively high fat meal  Today his blood sugar fasting was 81 but he did not bolus for his breakfast of pop tarts and blood sugar was 184 at lunch  His wife says that since he is not walking as much on his  own he is not going into the kitchen during the night and snacking  On the last visit his blood sugar had improved significantly with cutting back on carbohydrate and snacks  He has seen the dietitian in 3/16  Not able to exercise because of back pain and musculoskeletal issues and balance       Side effects from medications have been: None Compliance with the medical regimen: Fair  Hypoglycemia:   none  Glucose monitoring:  done 4-5 times  a day         Glucometer: One Touch ultra 2 .      Blood Glucose readings by download of pump as above          Meals: 9 am , 2-3 pm and 6-7 pm             Dietician visit, most recent: 4/19               Weight history:   Wt Readings from Last 3 Encounters:  11/13/17 253 lb 4.8 oz (114.9 kg)  11/08/17 253 lb 4.8 oz (114.9 kg)  10/26/17 256 lb (116.1 kg)    Glycemic control:   Lab Results  Component Value Date   HGBA1C 7.2 (H) 11/08/2017   HGBA1C 8.4 (A) 09/19/2017  HGBA1C 9.6 05/17/2017   Lab Results  Component Value Date   MICROALBUR <0.7 02/05/2017   LDLCALC 118 (H) 09/16/2015   CREATININE 1.61 (H) 11/08/2017    Lab Results  Component Value Date   FRUCTOSAMINE 277 11/09/2017   FRUCTOSAMINE 350 (H) 03/29/2017   FRUCTOSAMINE 250 09/16/2015     Other active problems: See review of systems      Allergies as of 12/04/2017      Reactions   Penicillins Rash, Other (See Comments)   WEARS ALLERGY BRACELET Because of a history of documented adverse serious drug reaction;Medi Alert bracelet  is recommended PATIENT HAS HAD A PCN REACTION WITH IMMEDIATE RASH, FACIAL/TONGUE/THROAT SWELLING, SOB, OR LIGHTHEADEDNESS WITH HYPOTENSION:  #  #  YES  #  #  Has patient had a PCN reaction causing severe rash involving mucus membranes or skin necrosis: unknown Has patient had a PCN reaction that required hospitalization NO Has patient had a PCN reaction occurring within the last 10 years: NO   Klonopin [clonazepam] Other (See  Comments)   agitation   Peanut-containing Drug Products Cough   Watermelon [citrullus Vulgaris] Other (See Comments), Cough   Tickle in throat      Medication List        Accurate as of 12/04/17  9:35 PM. Always use your most recent med list.          acetaminophen 500 MG tablet Commonly known as:  TYLENOL Take 500 mg by mouth 2 (two) times daily as needed for moderate pain or headache.   albuterol 108 (90 Base) MCG/ACT inhaler Commonly known as:  PROVENTIL HFA;VENTOLIN HFA Inhale 2 puffs into the lungs every 6 (six) hours as needed for wheezing or shortness of breath.   allopurinol 100 MG tablet Commonly known as:  ZYLOPRIM Take 1.5 tablets (150 mg total) by mouth daily.   AMBULATORY NON FORMULARY MEDICATION Ustep walker DX: G20   aspirin EC 81 MG tablet Take 81 mg by mouth daily.   Carbidopa-Levodopa ER 25-100 MG tablet controlled release Commonly known as:  SINEMET CR 2 at 7 am, 2 at 12pm, 1 at 4pm, 1 hs   cholecalciferol 1000 units tablet Commonly known as:  VITAMIN D Take 1,000 Units by mouth daily.   colchicine 0.6 MG tablet TAKE 1 TABLET BY MOUTH TWICE A DAY AS NEEDED FOR GOUT   ferrous sulfate 325 (65 FE) MG EC tablet Take 325 mg by mouth 2 (two) times daily.   furosemide 80 MG tablet Commonly known as:  LASIX TAKE 1 & 1/2 TABLETS DAILY   gabapentin 100 MG capsule Commonly known as:  NEURONTIN Take 100-200 mg by mouth in the morning and take 300-400 mg by mouth at bedtime   insulin lispro 100 UNIT/ML injection Commonly known as:  HUMALOG Uses via insulin pump OmniPod Dash.   KLOR-CON M20 20 MEQ tablet Generic drug:  potassium chloride SA TAKE 2 TABLETS (40 MEQ TOTAL) BY MOUTH DAILY.   levothyroxine 125 MCG tablet Commonly known as:  SYNTHROID, LEVOTHROID TAKE 1 TABLET EVERY DAY DAILY BEFORE BREAKFAST   lisinopril 2.5 MG tablet Commonly known as:  PRINIVIL,ZESTRIL TAKE 1 TABLET BY MOUTH DAILY.   metolazone 2.5 MG tablet Commonly known  as:  ZAROXOLYN TAKE 1 TABLET BY MOUTH EVERY DAY AS NEEDED FOR >3 LB IN A DAY OR >5 LB IN A WEEK   metoprolol succinate 50 MG 24 hr tablet Commonly known as:  TOPROL-XL TAKE 1 TABLET BY MOUTH DAILY. TAKE WITH OR  IMMEDIATELY FOLLOWING A MEAL.   multivitamin with minerals Tabs tablet Take 1 tablet by mouth daily.   MYRBETRIQ 50 MG Tb24 tablet Generic drug:  mirabegron ER Take 50 mg by mouth daily.   nitroGLYCERIN 0.4 MG SL tablet Commonly known as:  NITROSTAT Place 1 tablet (0.4 mg total) under the tongue every 5 (five) minutes as needed for chest pain.   OMNIPOD DASH SYSTEM Kit 1 each by Does not apply route. Use Dash system for Continuous Blood Glucose Monitoring.   ONETOUCH VERIO test strip Generic drug:  glucose blood USE AS DIRECTED TO CHECK BLOOD SUGAR TWICE A DAY   OVER THE COUNTER MEDICATION Apply 1 application topically at bedtime. Theraworx Pain Cream   polyethylene glycol packet Commonly known as:  MIRALAX / GLYCOLAX Take 17 g by mouth daily as needed for moderate constipation.   rosuvastatin 20 MG tablet Commonly known as:  CRESTOR TAKE 1 TABLET BY MOUTH EVERY DAY   traMADol 50 MG tablet Commonly known as:  ULTRAM TAKE 1 TABLET BY MOUTH TWICE A DAY AS NEEDED   travoprost (benzalkonium) 0.004 % ophthalmic solution Commonly known as:  TRAVATAN Place 1 drop into the right eye at bedtime.   VICTOZA 18 MG/3ML Sopn Generic drug:  liraglutide INJECT 1.'8MG'$  ONCE DAILY   vitamin B-12 1000 MCG tablet Commonly known as:  CYANOCOBALAMIN Take 1,000 mcg by mouth daily.   WIXELA INHUB 250-50 MCG/DOSE Aepb Generic drug:  Fluticasone-Salmeterol INHALE 1 PUFF INTO THE LUNGS 2 TIMES DAILY.       Allergies:  Allergies  Allergen Reactions  . Penicillins Rash and Other (See Comments)    WEARS ALLERGY BRACELET Because of a history of documented adverse serious drug reaction;Medi Alert bracelet  is recommended PATIENT HAS HAD A PCN REACTION WITH IMMEDIATE RASH,  FACIAL/TONGUE/THROAT SWELLING, SOB, OR LIGHTHEADEDNESS WITH HYPOTENSION:  #  #  YES  #  #  Has patient had a PCN reaction causing severe rash involving mucus membranes or skin necrosis: unknown Has patient had a PCN reaction that required hospitalization NO Has patient had a PCN reaction occurring within the last 10 years: NO  . Klonopin [Clonazepam] Other (See Comments)    agitation  . Peanut-Containing Drug Products Cough  . Watermelon [Citrullus Vulgaris] Other (See Comments) and Cough    Tickle in throat    Past Medical History:  Diagnosis Date  . AICD (automatic cardioverter/defibrillator) present    Dr Lovena Le office visit yearly, MDT   . Arthritis    cane  . Asthma   . Benign neoplasm of colon   . CAD (coronary artery disease)   . Cardiomyopathy   . Cataract   . Complication of anesthesia    pt states that he got a rash  . Constipation   . 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 DM (diabetes mellitus) ([place])    TYPE 2   . GERD (gastroesophageal reflux disease)   . Glaucoma    right eye  . HLD (hyperlipidemia)   . HTN (hypertension)    pt denies 08/19/12  . Hyperplasia, prostate   . Hyperthyroidism    thyroid lobectomy  . MI (myocardial infarction) [facility])    Dr Stanford Breed 2000, x3vessels bypass  . Nephrolithiasis   . OSA (obstructive sleep apnea)    AHI-28,on CPAP, noncompliant with CPAP  . Parkinson disease ([place])    1999  . PONV (postoperative nausea and vomiting)   . Restless legs   .  Shortness of breath    Hx: of at all times  . Sleep apnea    does not use CPAP  . Sleep apnea, organic   . UTI (lower urinary tract infection) 09/15/12   Klebsiella  . Ventral hernia     Past Surgical History:  Procedure Laterality Date  . 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 CARDIAC CATHETERIZATION    . CATARACT EXTRACTION W/ INTRAOCULAR LENS IMPLANT     Hx: of right eye  . CATARACT EXTRACTION W/ INTRAOCULAR LENS IMPLANT Left 2018  .  COLONOSCOPY N/A 10/13/2014   Procedure: COLONOSCOPY;  Surgeon: Jerene Bears, MD;  Location: [facility] ENDOSCOPY;  Service: Gastroenterology;  Laterality: N/A;  . COLONOSCOPY W/ BIOPSIES AND POLYPECTOMY     Hx: of  . CORONARY ARTERY BYPASS GRAFT  2000   [name], MD  . [place]  . DEEP BRAIN STIMULATOR PLACEMENT  2004   Right and left VIN stimulator placement (parkinsons)  . EYE SURGERY    . FINGER AMPUTATION     left pointer  . IMPLANTABLE CARDIOVERTER DEFIBRILLATOR IMPLANT N/A 11/13/2013   Procedure: IMPLANTABLE CARDIOVERTER DEFIBRILLATOR IMPLANT;  Surgeon: Evans Lance, MD;  Location: [facility] CATH LAB;  Service: Cardiovascular;  Laterality: N/A;  . INSERT / REPLACE / REMOVE PACEMAKER    . LEFT AND RIGHT HEART CATHETERIZATION WITH CORONARY ANGIOGRAM N/A 09/24/2013   Procedure: LEFT AND RIGHT HEART CATHETERIZATION WITH CORONARY ANGIOGRAM;  Surgeon: Burnell Blanks, MD;  Location: [facility] CATH LAB;  Service: Cardiovascular;  Laterality: N/A;  . LITHOTRIPSY     3 different times  . MEDIAN STERNOTOMY  2000  . PULSE GENERATOR IMPLANT Right 11/13/2017   Procedure: Right chest implantable pulse generator change;  Surgeon: Erline Levine, MD;  Location: [place];  Service: Neurosurgery;  Laterality: Right;  Right chest implantable pulse generator change  . SUBTHALAMIC STIMULATOR BATTERY REPLACEMENT N/A 09/05/2012   Procedure: Deep brain stimulator battery change;  Surgeon: Erline Levine, MD;  Location: [place] NEURO ORS;  Service: Neurosurgery;  Laterality: N/A;  Deep brain stimulator battery change  . SUBTHALAMIC STIMULATOR BATTERY REPLACEMENT N/A 06/10/2015   Procedure: Deep Brain stimulator battery change;  Surgeon: Erline Levine, MD;  Location: [place] NEURO ORS;  Service: Neurosurgery;  Laterality: N/A;  . TONSILLECTOMY      Family History  Problem Relation Age of Onset  . Aneurysm Mother   . Alcoholism Father   . HIV/AIDS Brother 4       AIDS  . Healthy Child   . Peripheral vascular  disease Unknown   . Arthritis Unknown   . Healthy Child   . Healthy Child   . Diabetes Neg Hx   . Heart disease Neg Hx   . Colon cancer Neg Hx   . Prostate cancer Neg Hx     Social History:  reports that he has never smoked. He has never used smokeless tobacco. He reports that he drinks alcohol. He reports that he does not use drugs.    Review of Systems   The following is a copy of the previous note:   Most recent eye exam was done in 04/2017    HYPERLIPIDEMIA:  He is on Crestor 20 mg with good control of LDL, has low HDL Triglycerides are persistently over 200, he is on Lovaza       Lab Results  Component Value Date   CHOL 123 04/04/2017   CHOL 111 10/16/2016  CHOL 132 12/06/2015   Lab Results  Component Value Date   HDL 32.30 (L) 04/04/2017   HDL 27.80 (L) 10/16/2016   HDL 30.20 (L) 12/06/2015   Lab Results  Component Value Date   LDLCALC 118 (H) 09/16/2015   LDLCALC 126 (H) 04/08/2013   LDLCALC 51 02/28/2012   Lab Results  Component Value Date   TRIG 232.0 (H) 04/04/2017   TRIG 287.0 (H) 10/16/2016   TRIG 217.0 (H) 12/06/2015   Lab Results  Component Value Date   CHOLHDL 4 04/04/2017   CHOLHDL 4 10/16/2016   CHOLHDL 4 12/06/2015   Lab Results  Component Value Date   LDLDIRECT 61.0 04/04/2017   LDLDIRECT 52.0 10/16/2016   LDLDIRECT 69.0 12/06/2015                  Thyroid:   He has had hypothyroidism following radioactive iodine treatment for hyperthyroidism in 2013  TSH is usually normal with  dosage of 125 g, slightly higher in 5/19   Lab Results  Component Value Date   TSH 5.68 (H) 11/09/2017   TSH 4.57 (H) 05/17/2017   TSH 3.62 02/05/2017   FREET4 0.72 11/09/2017   FREET4 0.74 02/05/2017   FREET4 0.82 08/20/2014       HYPERTENSION has been followed by  other physicians   CKD: Has variable creatinine levels, usually consistently high   Lab Results  Component Value Date   CREATININE 1.61 (H) 11/08/2017   CREATININE 1.91 (H)  10/16/2017   CREATININE 2.28 (H) 10/11/2017    Neuropathy: Taking gabapentin as needed  Diabetic foot exam in 04/2017 showed  normal monofilament sensation in the toes except decreased on the right great toe and plantar surfaces, no skin lesions or ulcers on the feet and normal pedal pulses    Physical Examination:  Ht '5\' 9"'$  (1.753 m)   BMI 37.41 kg/m      ASSESSMENT/PLAN  Diabetes type 2, insulin-dependent with obesity     See history of present illness for detailed discussion of his current management, blood sugar patterns and problems identified  His A1c more recently was 7.2 and improved  With starting the Omnipod pump his blood sugars are significantly better However he has not done enough postprandial eating Also did not bolus when his blood sugar was normal this morning at breakfast Otherwise current blood sugars are looking excellent with low normal reading of 81 this morning fasting  He will reduce his basal rate down to 1.6 at midnight and continue one-point 7:08 AM No change in mealtime boluses He needs to add another 2 units for higher fat meals or if portions of carbohydrates are larger  Patient Instructions  Basal 1.6 until 7 am  More sugars after meals     Follow-up tomorrow   Elayne Snare 12/04/2017, 9:35 PM   Note: This office note was prepared with Dragon voice recognition system technology. Any transcriptional errors that result from this process are unintentional.

## 2017-12-04 NOTE — Patient Instructions (Addendum)
Basal 1.6 until 7 am  More sugars after meals

## 2017-12-04 NOTE — Progress Notes (Signed)
Henry [name] and his wife were trained on how to use th Dash insulin pump.  His wife filled the pod with Humalog insulin, and applied it to her husband's left abdomen.  We discussed the need to rotate sites, and she was shown alternate sites to use on his upper arms, and upper buttocks areas.   Settings were put into pump per Dr. Ronnie Derby order.  Basal rate: 1.8,  I/C ratio: 10, ISF: 50, timing: 4 hours, and target 120 with correction over 140.   We reviewed how to give a bolus X 3 using the same bolus settings per his V-go.  40 grams for breakfast and lunch, and 80 grams at supper.  He sometimes eats 6 crackers for an HS snack, so he was told to give 15g for this.   They had no final questions

## 2017-12-04 NOTE — Patient Instructions (Signed)
Read over starter guide and book.   Call 800 help line if questions. Test blood sugar before meals, and at bedtime.

## 2017-12-05 ENCOUNTER — Other Ambulatory Visit: Payer: Self-pay | Admitting: Neurology

## 2017-12-05 ENCOUNTER — Ambulatory Visit (INDEPENDENT_AMBULATORY_CARE_PROVIDER_SITE_OTHER): Payer: Medicare Other | Admitting: Endocrinology

## 2017-12-05 ENCOUNTER — Encounter: Payer: Self-pay | Admitting: Endocrinology

## 2017-12-05 ENCOUNTER — Encounter: Payer: Medicare Other | Admitting: Nutrition

## 2017-12-05 DIAGNOSIS — E1165 Type 2 diabetes mellitus with hyperglycemia: Secondary | ICD-10-CM

## 2017-12-05 DIAGNOSIS — Z794 Long term (current) use of insulin: Secondary | ICD-10-CM | POA: Diagnosis not present

## 2017-12-05 DIAGNOSIS — I255 Ischemic cardiomyopathy: Secondary | ICD-10-CM

## 2017-12-05 NOTE — Progress Notes (Signed)
Mr. Ransome and his wife reported no difficulty using the Dash pump.  Boluses were given correctly and he did not have any difficulty sleeping/wearing the pump.   We reviewed all topics on the checklist and they were shown how to do a temporary basal rate, and how to change the basal rate.  Mrs. Holmer changed the basal rate per Dr. Ronnie Derby order with some assistance from me.  New basal rate:  MN-6AM 1.6, 6AM-MN: 1.8.  She requested to come in tomorrow for her first pod change, and an appointment was made.  They had no final questions.

## 2017-12-05 NOTE — Patient Instructions (Signed)
Review resource guide on how to change the pod, and how to do a temporary basal rate Bring new pod and insulin to appointment tomorrow.

## 2017-12-05 NOTE — Progress Notes (Signed)
Patient ID: Henry [name], male   DOB: 11-04-1950, 67 y.o.   MRN: [id number]           Reason for Appointment: Follow-up for Type 2 Diabetes   History of Present Illness:          Diagnosis: Type 2 diabetes mellitus, date of diagnosis: 2000      Past history:   Patient thinks he has been taking Amaryl for several years and probably metformin since onset also At some point he was changed from metformin to [facility] and was taking this since at least 2012 A1c had been higher in 2015 Since his A1c had been progressively higher with his regimen of Janumet and Amaryl he was started on Victoza in 01/2014 He was started on Lantus insulin in 5/16 because of persistent hyperglycemia especially fasting; was having readings as high as 293 Because of tendency to high postprandial readings and for control with Lantus he was switched to the V-go pump in  09/2014  Recent history:   INSULIN regimen: Omnipod insulin pump Basal rate programs: Midnight = 1.6 and 7 AM = 1.8 Boluses 4 units for breakfast and lunch and 8 units for dinner  Non-insulin hypoglycemic drugs the patient is taking are:  Victoza 1.8 mg daily in am   His A1c was  8.4 previously and last month down to 7.2  Current management, blood sugar patterns and problems identified:  He has come back for second day of starting the Omnipod insulin pump  Since his fasting glucose was 81 yesterday his overnight basal rate was reduced down to 1.6  He has checked his sugars both before and after eating although not 2 hours later including today  Yesterday glucose was 134 before dinner  With a large meal with [place] food which was not low-fat his blood sugar was 240 about 3 hours later and he took about a 4 unit correctional dose at bedtime  Fasting reading was still high at 154 and his wife thinks that he had a snack during the night although patient denies this  He had to pop tart for breakfast but only entered 40 g of carbohydrates  instead of 66 and blood sugar was 178 noon  Had 5 units for his lunch bolus   He has seen the dietitian in 3/16  Not able to exercise because of back pain and musculoskeletal issues and balance       Side effects from medications have been: None Compliance with the medical regimen: Fair  Hypoglycemia:   none  Glucose monitoring:  done 4-5 times  a day         Glucometer: One Touch ultra 2 .      Blood Glucose readings by download of pump as above          Meals: 9 am , 2-3 pm and 6-7 pm            Dietician visit, most recent: 4/19               Weight history:   Wt Readings from Last 3 Encounters:  11/13/17 253 lb 4.8 oz (114.9 kg)  11/08/17 253 lb 4.8 oz (114.9 kg)  10/26/17 256 lb (116.1 kg)    Glycemic control:   Lab Results  Component Value Date   HGBA1C 7.2 (H) 11/08/2017   HGBA1C 8.4 (A) 09/19/2017   HGBA1C 9.6 05/17/2017   Lab Results  Component Value Date   MICROALBUR <0.7 02/05/2017   [place] 118 (  H) 09/16/2015   CREATININE 1.61 (H) 11/08/2017    Lab Results  Component Value Date   FRUCTOSAMINE 277 11/09/2017   FRUCTOSAMINE 350 (H) 03/29/2017   FRUCTOSAMINE 250 09/16/2015     Other active problems: See review of systems      Allergies as of 12/05/2017      Reactions   Penicillins Rash, Other (See Comments)   WEARS ALLERGY BRACELET Because of a history of documented adverse serious drug reaction;Medi Alert bracelet  is recommended PATIENT HAS HAD A PCN REACTION WITH IMMEDIATE RASH, FACIAL/TONGUE/THROAT SWELLING, SOB, OR LIGHTHEADEDNESS WITH HYPOTENSION:  #  #  YES  #  #  Has patient had a PCN reaction causing severe rash involving mucus membranes or skin necrosis: unknown Has patient had a PCN reaction that required hospitalization NO Has patient had a PCN reaction occurring within the last 10 years: NO   Klonopin [clonazepam] Other (See Comments)   agitation   Peanut-containing Drug Products Cough   Watermelon [citrullus Vulgaris] Other  (See Comments), Cough   Tickle in throat      Medication List        Accurate as of 12/05/17 11:59 PM. Always use your most recent med list.          acetaminophen 500 MG tablet Commonly known as:  TYLENOL Take 500 mg by mouth 2 (two) times daily as needed for moderate pain or headache.   albuterol 108 (90 Base) MCG/ACT inhaler Commonly known as:  PROVENTIL HFA;VENTOLIN HFA Inhale 2 puffs into the lungs every 6 (six) hours as needed for wheezing or shortness of breath.   allopurinol 100 MG tablet Commonly known as:  ZYLOPRIM Take 1.5 tablets (150 mg total) by mouth daily.   AMBULATORY NON FORMULARY MEDICATION Ustep walker DX: G20   aspirin EC 81 MG tablet Take 81 mg by mouth daily.   Carbidopa-Levodopa ER 25-100 MG tablet controlled release Commonly known as:  SINEMET CR 2 at 7 am, 2 at 12pm, 1 at 4pm, 1 hs   carbidopa-levodopa 25-100 MG tablet Commonly known as:  SINEMET IR 2 TABLETS IN THE MORNING, 2 IN THE AFTERNOON, 1 IN THE EVENING, 1 AT BEDTIME   cholecalciferol 1000 units tablet Commonly known as:  VITAMIN D Take 1,000 Units by mouth daily.   colchicine 0.6 MG tablet TAKE 1 TABLET BY MOUTH TWICE A DAY AS NEEDED FOR GOUT   ferrous sulfate 325 (65 FE) MG EC tablet Take 325 mg by mouth 2 (two) times daily.   furosemide 80 MG tablet Commonly known as:  LASIX TAKE 1 & 1/2 TABLETS DAILY   gabapentin 100 MG capsule Commonly known as:  NEURONTIN Take 100-200 mg by mouth in the morning and take 300-400 mg by mouth at bedtime   insulin lispro 100 UNIT/ML injection Commonly known as:  HUMALOG Uses via insulin pump OmniPod Dash.   KLOR-CON M20 20 MEQ tablet Generic drug:  potassium chloride SA TAKE 2 TABLETS (40 MEQ TOTAL) BY MOUTH DAILY.   levothyroxine 125 MCG tablet Commonly known as:  SYNTHROID, LEVOTHROID TAKE 1 TABLET EVERY DAY DAILY BEFORE BREAKFAST   lisinopril 2.5 MG tablet Commonly known as:  PRINIVIL,ZESTRIL TAKE 1 TABLET BY MOUTH DAILY.     metolazone 2.5 MG tablet Commonly known as:  ZAROXOLYN TAKE 1 TABLET BY MOUTH EVERY DAY AS NEEDED FOR >3 LB IN A DAY OR >5 LB IN A WEEK   metoprolol succinate 50 MG 24 hr tablet Commonly known as:  TOPROL-XL TAKE  1 TABLET BY MOUTH DAILY. TAKE WITH OR IMMEDIATELY FOLLOWING A MEAL.   multivitamin with minerals Tabs tablet Take 1 tablet by mouth daily.   MYRBETRIQ 50 MG Tb24 tablet Generic drug:  mirabegron ER Take 50 mg by mouth daily.   nitroGLYCERIN 0.4 MG SL tablet Commonly known as:  NITROSTAT Place 1 tablet (0.4 mg total) under the tongue every 5 (five) minutes as needed for chest pain.   OMNIPOD DASH SYSTEM Kit 1 each by Does not apply route. Use Dash system for Continuous Blood Glucose Monitoring.   ONETOUCH VERIO test strip Generic drug:  glucose blood USE AS DIRECTED TO CHECK BLOOD SUGAR TWICE A DAY   OVER THE COUNTER MEDICATION Apply 1 application topically at bedtime. Theraworx Pain Cream   polyethylene glycol packet Commonly known as:  MIRALAX / GLYCOLAX Take 17 g by mouth daily as needed for moderate constipation.   rosuvastatin 20 MG tablet Commonly known as:  CRESTOR TAKE 1 TABLET BY MOUTH EVERY DAY   traMADol 50 MG tablet Commonly known as:  ULTRAM TAKE 1 TABLET BY MOUTH TWICE A DAY AS NEEDED   travoprost (benzalkonium) 0.004 % ophthalmic solution Commonly known as:  TRAVATAN Place 1 drop into the right eye at bedtime.   VICTOZA 18 MG/3ML Sopn Generic drug:  liraglutide INJECT 1.'8MG'$  ONCE DAILY   vitamin B-12 1000 MCG tablet Commonly known as:  CYANOCOBALAMIN Take 1,000 mcg by mouth daily.   WIXELA INHUB 250-50 MCG/DOSE Aepb Generic drug:  Fluticasone-Salmeterol INHALE 1 PUFF INTO THE LUNGS 2 TIMES DAILY.       Allergies:  Allergies  Allergen Reactions  . Penicillins Rash and Other (See Comments)    WEARS ALLERGY BRACELET Because of a history of documented adverse serious drug reaction;Medi Alert bracelet  is recommended PATIENT HAS HAD  A PCN REACTION WITH IMMEDIATE RASH, FACIAL/TONGUE/THROAT SWELLING, SOB, OR LIGHTHEADEDNESS WITH HYPOTENSION:  #  #  YES  #  #  Has patient had a PCN reaction causing severe rash involving mucus membranes or skin necrosis: unknown Has patient had a PCN reaction that required hospitalization NO Has patient had a PCN reaction occurring within the last 10 years: NO  . Klonopin [Clonazepam] Other (See Comments)    agitation  . Peanut-Containing Drug Products Cough  . Watermelon [Citrullus Vulgaris] Other (See Comments) and Cough    Tickle in throat    Past Medical History:  Diagnosis Date  . AICD (automatic cardioverter/defibrillator) present    Dr Lovena Le office visit yearly, MDT   . Arthritis    cane  . Asthma   . Benign neoplasm of colon   . CAD (coronary artery disease)   . Cardiomyopathy   . Cataract   . Complication of anesthesia    pt states that he got a rash  . Constipation   . 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 DM (diabetes mellitus) ([place])    TYPE 2   . GERD (gastroesophageal reflux disease)   . Glaucoma    right eye  . HLD (hyperlipidemia)   . HTN (hypertension)    pt denies 08/19/12  . Hyperplasia, prostate   . Hyperthyroidism    thyroid lobectomy  . MI (myocardial infarction) [facility])    Dr Stanford Breed 2000, x3vessels bypass  . Nephrolithiasis   . OSA (obstructive sleep apnea)    AHI-28,on CPAP, noncompliant with CPAP  . Parkinson disease ([place])    1999  . PONV (postoperative nausea and vomiting)   .  Restless legs   . Shortness of breath    Hx: of at all times  . Sleep apnea    does not use CPAP  . Sleep apnea, organic   . UTI (lower urinary tract infection) 09/15/12   Klebsiella  . Ventral hernia     Past Surgical History:  Procedure Laterality Date  . 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 CARDIAC CATHETERIZATION    . CATARACT EXTRACTION W/ INTRAOCULAR LENS IMPLANT     Hx: of right eye  . CATARACT EXTRACTION W/  INTRAOCULAR LENS IMPLANT Left 2018  . COLONOSCOPY N/A 10/13/2014   Procedure: COLONOSCOPY;  Surgeon: Jerene Bears, MD;  Location: [facility] ENDOSCOPY;  Service: Gastroenterology;  Laterality: N/A;  . COLONOSCOPY W/ BIOPSIES AND POLYPECTOMY     Hx: of  . CORONARY ARTERY BYPASS GRAFT  2000   [name], MD  . [place]  . DEEP BRAIN STIMULATOR PLACEMENT  2004   Right and left VIN stimulator placement (parkinsons)  . EYE SURGERY    . FINGER AMPUTATION     left pointer  . IMPLANTABLE CARDIOVERTER DEFIBRILLATOR IMPLANT N/A 11/13/2013   Procedure: IMPLANTABLE CARDIOVERTER DEFIBRILLATOR IMPLANT;  Surgeon: Evans Lance, MD;  Location[facility] C[facility] LAB;  Service: Cardiovascular;  Laterality: N/A;  . INSERT / REPLACE / REMOVE PACEMAKER    . LEFT AND RIGHT HEART CATHETERIZATION WITH CORONARY ANGIOGRAM N/A 09/24/2013   Procedure: LEFT AND RIGHT HEART CATHETERIZATION WITH CORONARY ANGIOGRAM;  Surgeon: Burnell Blanks, MD;  Location: [facility] CATH LAB;  Service: Cardiovascular;  Laterality: N/A;  . LITHOTRIPSY     3 different times  . MEDIAN STERNOTOMY  2000  . PULSE GENERATOR IMPLANT Right 11/13/2017   Procedure: Right chest implantable pulse generator change;  Surgeon: Erline Levine, MD;  Location: [place];  Service: Neurosurgery;  Laterality: Right;  Right chest implantable pulse generator change  . SUBTHALAMIC STIMULATOR BATTERY REPLACEMENT N/A 09/05/2012   Procedure: Deep brain stimulator battery change;  Surgeon: Erline Levine, MD;  Location: [place] NEURO ORS;  Service: Neurosurgery;  Laterality: N/A;  Deep brain stimulator battery change  . SUBTHALAMIC STIMULATOR BATTERY REPLACEMENT N/A 06/10/2015   Procedure: Deep Brain stimulator battery change;  Surgeon: Erline Levine, MD;  Location: [place] NEURO ORS;  Service: Neurosurgery;  Laterality: N/A;  . TONSILLECTOMY      Family History  Problem Relation Age of Onset  . Aneurysm Mother   . Alcoholism Father   . HIV/AIDS Brother 40       AIDS  .  Healthy Child   . Peripheral vascular disease Unknown   . Arthritis Unknown   . Healthy Child   . Healthy Child   . Diabetes Neg Hx   . Heart disease Neg Hx   . Colon cancer Neg Hx   . Prostate cancer Neg Hx     Social History:  reports that he has never smoked. He has never used smokeless tobacco. He reports that he drinks alcohol. He reports that he does not use drugs.    Review of Systems   The following is a copy of the previous note:   Most recent eye exam was done in 04/2017   HYPERLIPIDEMIA:  He is on Crestor 20 mg with good control of LDL, has low HDL Triglycerides are persistently over 200, he is on Lovaza       Lab Results  Component Value Date   CHOL 123 04/04/2017   CHOL  111 10/16/2016   CHOL 132 12/06/2015   Lab Results  Component Value Date   HDL 32.30 (L) 04/04/2017   HDL 27.80 (L) 10/16/2016   HDL 30.20 (L) 12/06/2015   Lab Results  Component Value Date   LDLCALC 118 (H) 09/16/2015   LDLCALC 126 (H) 04/08/2013   LDLCALC 51 02/28/2012   Lab Results  Component Value Date   TRIG 232.0 (H) 04/04/2017   TRIG 287.0 (H) 10/16/2016   TRIG 217.0 (H) 12/06/2015   Lab Results  Component Value Date   CHOLHDL 4 04/04/2017   CHOLHDL 4 10/16/2016   CHOLHDL 4 12/06/2015   Lab Results  Component Value Date   LDLDIRECT 61.0 04/04/2017   LDLDIRECT 52.0 10/16/2016   LDLDIRECT 69.0 12/06/2015                  Thyroid:   He has had hypothyroidism following radioactive iodine treatment for hyperthyroidism in 2013  TSH is usually normal with  dosage of 125 g, slightly higher in 5/19   Lab Results  Component Value Date   TSH 5.68 (H) 11/09/2017   TSH 4.57 (H) 05/17/2017   TSH 3.62 02/05/2017   FREET4 0.72 11/09/2017   FREET4 0.74 02/05/2017   FREET4 0.82 08/20/2014       HYPERTENSION has been followed by  other physicians   CKD: Has variable creatinine levels, usually consistently high   Lab Results  Component Value Date   CREATININE 1.61  (H) 11/08/2017   CREATININE 1.91 (H) 10/16/2017   CREATININE 2.28 (H) 10/11/2017    Neuropathy: Taking gabapentin as needed  Diabetic foot exam in 04/2017 showed  normal monofilament sensation in the toes except decreased on the right great toe and plantar surfaces, no skin lesions or ulcers on the feet and normal pedal pulses    Physical Examination:  There were no vitals taken for this visit.     ASSESSMENT/PLAN  Diabetes type 2, insulin-dependent with obesity     See history of present illness for detailed discussion of his current management, blood sugar patterns and problems identified  His A1c in late October was 7.2  The second of his pump start was reviewed as above Although his sugars are reasonably good he is not watching his diet with relatively high fat or high carbohydrate intake both at dinner and breakfast causing blood sugars to be as high as 240 Also may have had some snack at night to raise his fasting reading Today discussed in detail the estimation of his boluses based on actual carbohydrate intake and extra for higher fat meals He is checking his sugars regularly and encouraged him to continue Discussed need to cut back on high fat foods including at breakfast when he would preferably have only eggs and toast as a balanced low-fat meal Currently no change in insulin pump settings is required and he will follow-up in about a month His instructions with the nurse educator have been going well and his wife is proficient in managing the pump  There are no Patient Instructions on file for this visit.    Follow-up tomorrow   Elayne Snare 12/07/2017, 9:17 AM   Note: This office note was prepared with Dragon voice recognition system technology. Any transcriptional errors that result from this process are unintentional.

## 2017-12-12 ENCOUNTER — Encounter: Payer: Self-pay | Admitting: Podiatry

## 2017-12-12 ENCOUNTER — Ambulatory Visit (INDEPENDENT_AMBULATORY_CARE_PROVIDER_SITE_OTHER): Payer: Medicare Other

## 2017-12-12 ENCOUNTER — Ambulatory Visit (INDEPENDENT_AMBULATORY_CARE_PROVIDER_SITE_OTHER): Payer: Medicare Other | Admitting: Podiatry

## 2017-12-12 DIAGNOSIS — M79675 Pain in left toe(s): Secondary | ICD-10-CM

## 2017-12-12 DIAGNOSIS — M2012 Hallux valgus (acquired), left foot: Secondary | ICD-10-CM

## 2017-12-12 DIAGNOSIS — M79674 Pain in right toe(s): Secondary | ICD-10-CM | POA: Diagnosis not present

## 2017-12-12 DIAGNOSIS — I255 Ischemic cardiomyopathy: Secondary | ICD-10-CM

## 2017-12-12 DIAGNOSIS — M2011 Hallux valgus (acquired), right foot: Secondary | ICD-10-CM

## 2017-12-12 DIAGNOSIS — M779 Enthesopathy, unspecified: Secondary | ICD-10-CM | POA: Diagnosis not present

## 2017-12-12 DIAGNOSIS — M1000 Idiopathic gout, unspecified site: Secondary | ICD-10-CM | POA: Diagnosis not present

## 2017-12-12 DIAGNOSIS — B351 Tinea unguium: Secondary | ICD-10-CM | POA: Diagnosis not present

## 2017-12-12 MED ORDER — TRIAMCINOLONE ACETONIDE 10 MG/ML IJ SUSP
10.0000 mg | Freq: Once | INTRAMUSCULAR | Status: AC
  Administered 2017-12-12: 10 mg

## 2017-12-12 NOTE — Progress Notes (Signed)
Subjective:   Patient ID: Henry [name], male   DOB: 67 y.o.   MRN: [id number]   HPI Patient states is getting a lot of pain around the big toe joint right with inflammation possible gout and has nail disease 1-5 both feet that are very bothersome and he cannot cut and he has Parkinson's   ROS      Objective:  Physical Exam  Neurovascular status was found to be intact with significant structural bunion deformity right upper left with inflammation fluid around the first MPJ right and nail disease with pain 1-5 both feet with thick incurvated beds     Assessment:  Possibility for gout condition right with bunion deformity also noted inflammatory capsulitis and mycotic nail infection     Plan:  Reviewed gout and gave him instructions on foods to avoid and activities.  Debrided nailbeds 1-5 both feet with no iatrogenic bleeding and did sterile prep of the right forefoot and injected the first MPJ 3 mg Kenalog 5 mg Xylocaine with sterile dressing applied.  Reappoint to recheck  X-rays indicate significant bunion deformity bilateral with no indications of lysis or other pathology

## 2017-12-12 NOTE — Patient Instructions (Addendum)
Bunion A bunion is a bump on the base of the big toe that forms when the bones of the big toe joint move out of position. Bunions may be small at first, but they often get larger over time. The can make walking painful. What are the causes? A bunion may be caused by:  Wearing narrow or pointed shoes that force the big toe to press against the other toes.  Abnormal foot development that causes the foot to roll inward (pronate).  Changes in the foot that are caused by certain diseases, such as rheumatoid arthritis and polio.  A foot injury.  What increases the risk? The following factors may make you more likely to develop this condition:  Wearing shoes that squeeze the toes together.  Having certain diseases, such as: ? Rheumatoid arthritis. ? Polio. ? Cerebral palsy.  Having family members who have bunions.  Being born with a foot deformity, such as flat feet or low arches.  Doing activities that put a lot of pressure on the feet, such as ballet dancing.  What are the signs or symptoms? The main symptom of a bunion is a noticeable bump on the big toe. Other symptoms may include:  Pain.  Swelling around the big toe.  Redness and inflammation.  Thick or hardened skin on the big toe or between the toes.  Stiffness or loss of motion in the big toe.  Trouble with walking.  How is this diagnosed? A bunion may be diagnosed based on your symptoms, medical history, and activities. You may have tests, such as:  X-rays. These allow your health care provider to check the position of the bones in your foot and look for damage to your joint. They also help your health care provider to determine the severity of your bunion and the best way to treat it.  Joint aspiration. In this test, a sample of fluid is removed from the toe joint. This test, which may be done if you are in a lot of pain, helps to rule out diseases that cause painful swelling of the joints, such as  arthritis.  How is this treated? There is no cure for a bunion, but treatment can help to prevent a bunion from getting worse. Treatment depends on the severity of your symptoms. Your health care provider may recommend:  Wearing shoes that have a wide toe box.  Using bunion pads to cushion the affected area.  Taping your toes together to keep them in a normal position.  Placing a device inside your shoe (orthotics) to help reduce pressure on your toe joint.  Taking medicine to ease pain, inflammation, and swelling.  Applying heat or ice to the affected area.  Doing stretching exercises.  Surgery to remove scar tissue and move the toes back into their normal position. This treatment is rare.  Follow these instructions at home:  Support your toe joint with proper footwear, shoe padding, or taping as told by your health care provider.  Take over-the-counter and prescription medicines only as told by your health care provider.  If directed, apply ice to the injured area: ? Put ice in a plastic bag. ? Place a towel between your skin and the bag. ? Leave the ice on for 20 minutes, 2-3 times per day.  If directed, apply heat to the affected area before you exercise. Use the heat source that your health care provider recommends, such as a moist heat pack or a heating pad. ? Place a towel between your   skin and the heat source. ? Leave the heat on for 20-30 minutes. ? Remove the heat if your skin turns bright red. This is especially important if you are unable to feel pain, heat, or cold. You may have a greater risk of getting burned.  Do exercises as told by your health care provider.  Keep all follow-up visits as told by your health care provider. Contact a health care provider if:  Your symptoms get worse.  Your symptoms do not improve in 2 weeks. Get help right away if:  You have severe pain and trouble with walking. This information is not intended to replace advice given  to you by your health care provider. Make sure you discuss any questions you have with your health care provider. Document Released: 12/26/2004 Document Revised: 06/03/2015 Document Reviewed: 07/26/2014 Elsevier Interactive Patient Education  2018 [employer].  Gout Gout is painful swelling that can happen in some of your joints. Gout is a type of arthritis. This condition is caused by having too much uric acid in your body. Uric acid is a chemical that is made when your body breaks down substances called purines. If your body has too much uric acid, sharp crystals can form and build up in your joints. This causes pain and swelling. Gout attacks can happen quickly and be very painful (acute gout). Over time, the attacks can affect more joints and happen more often (chronic gout). Follow these instructions at home: During a Gout Attack  If directed, put ice on the painful area: ? Put ice in a plastic bag. ? Place a towel between your skin and the bag. ? Leave the ice on for 20 minutes, 2-3 times a day.  Rest the joint as much as possible. If the joint is in your leg, you may be given crutches to use.  Raise (elevate) the painful joint above the level of your heart as often as you can.  Drink enough fluids to keep your pee (urine) clear or pale yellow.  Take over-the-counter and prescription medicines only as told by your doctor.  Do not drive or use heavy machinery while taking prescription pain medicine.  Follow instructions from your doctor about what you can or cannot eat and drink.  Return to your normal activities as told by your doctor. Ask your doctor what activities are safe for you. Avoiding Future Gout Attacks  Follow a low-purine diet as told by a specialist (dietitian) or your doctor. Avoid foods and drinks that have a lot of purines, such as: ? Liver. ? Kidney. ? Anchovies. ? Asparagus. ? Herring. ? Mushrooms ? Mussels. ? Beer.  Limit alcohol intake to no more  than 1 drink a day for nonpregnant women and 2 drinks a day for men. One drink equals 12 oz of beer, 5 oz of wine, or 1 oz of hard liquor.  Stay at a healthy weight or lose weight if you are overweight. If you want to lose weight, talk with your doctor. It is important that you do not lose weight too fast.  Start or continue an exercise plan as told by your doctor.  Drink enough fluids to keep your pee clear or pale yellow.  Take over-the-counter and prescription medicines only as told by your doctor.  Keep all follow-up visits as told by your doctor. This is important. Contact a doctor if:  You have another gout attack.  You still have symptoms of a gout attack after10 days of treatment.  You have  problems (side effects) because of your medicines.  You have chills or a fever.  You have burning pain when you pee (urinate).  You have pain in your lower back or belly. Get help right away if:  You have very bad pain.  Your pain cannot be controlled.  You cannot pee. This information is not intended to replace advice given to you by your health care provider. Make sure you discuss any questions you have with your health care provider. Document Released: 10/05/2007 Document Revised: 06/03/2015 Document Reviewed: 10/08/2014 Elsevier Interactive Patient Education  [employer].

## 2017-12-15 ENCOUNTER — Other Ambulatory Visit: Payer: Self-pay | Admitting: Student

## 2017-12-17 NOTE — Telephone Encounter (Signed)
Rx request sent to pharmacy.  

## 2017-12-18 ENCOUNTER — Other Ambulatory Visit: Payer: Self-pay | Admitting: Family Medicine

## 2017-12-19 NOTE — Telephone Encounter (Signed)
Name of Medication: Tramadol Name of Pharmacy: [facility] or Written Date and Quantity: 10/21/17 #60/1 Last Office Visit and Type: 10/16/17 Next Office Visit and Type:  04/12/18 AMW Last Controlled Substance Agreement Date: 01/04/12 Last UDS: 12/18/12

## 2017-12-19 NOTE — Telephone Encounter (Signed)
Sent. Thanks.   

## 2017-12-21 ENCOUNTER — Other Ambulatory Visit: Payer: Self-pay | Admitting: Endocrinology

## 2017-12-24 ENCOUNTER — Other Ambulatory Visit: Payer: Self-pay

## 2017-12-24 MED ORDER — GLUCOSE BLOOD VI STRP: ORAL_STRIP | 3 refills | Status: DC

## 2017-12-28 ENCOUNTER — Ambulatory Visit (INDEPENDENT_AMBULATORY_CARE_PROVIDER_SITE_OTHER): Payer: Medicare Other | Admitting: Family Medicine

## 2017-12-28 ENCOUNTER — Encounter: Payer: Self-pay | Admitting: Family Medicine

## 2017-12-28 DIAGNOSIS — I255 Ischemic cardiomyopathy: Secondary | ICD-10-CM | POA: Diagnosis not present

## 2017-12-28 DIAGNOSIS — I509 Heart failure, unspecified: Secondary | ICD-10-CM | POA: Diagnosis not present

## 2017-12-28 DIAGNOSIS — M25519 Pain in unspecified shoulder: Secondary | ICD-10-CM

## 2017-12-28 MED ORDER — METOPROLOL SUCCINATE ER 50 MG PO TB24: 50.0000 mg | ORAL_TABLET | Freq: Every day | ORAL | Status: DC

## 2017-12-28 MED ORDER — LISINOPRIL 2.5 MG PO TABS: 2.5000 mg | ORAL_TABLET | Freq: Every day | ORAL | Status: DC

## 2017-12-28 MED ORDER — POTASSIUM CHLORIDE CRYS ER 20 MEQ PO TBCR: 20.0000 meq | EXTENDED_RELEASE_TABLET | Freq: Two times a day (BID) | ORAL | Status: DC

## 2017-12-28 MED ORDER — LEVOTHYROXINE SODIUM 125 MCG PO TABS: 125.0000 ug | ORAL_TABLET | Freq: Every day | ORAL | Status: DC

## 2017-12-28 MED ORDER — CARBIDOPA-LEVODOPA ER 25-100 MG PO TBCR: 1.0000 | EXTENDED_RELEASE_TABLET | ORAL | Status: DC

## 2017-12-28 MED ORDER — ALBUTEROL SULFATE HFA 108 (90 BASE) MCG/ACT IN AERS: 2.0000 | INHALATION_SPRAY | Freq: Four times a day (QID) | RESPIRATORY_TRACT | 5 refills | Status: DC | PRN

## 2017-12-28 MED ORDER — COLCHICINE 0.6 MG PO TABS: 0.6000 mg | ORAL_TABLET | Freq: Two times a day (BID) | ORAL | Status: DC | PRN

## 2017-12-28 MED ORDER — FUROSEMIDE 80 MG PO TABS: 120.0000 mg | ORAL_TABLET | Freq: Every day | ORAL | Status: DC

## 2017-12-28 NOTE — Patient Instructions (Addendum)
Use the shoulder exercises and try taking 1 extra gabapentin during the day as needed for pain.  If you shoulder continues to hurt, then ask about seeing Dr. Lorelei Pont here.  Take care.  Glad to see you.

## 2017-12-28 NOTE — Progress Notes (Signed)
Shoulder pain. Pain since falling 3 months.  With other falls in the meantime.  Now with pain down past the elbow.  Pain with ROM at the shoulder.  Starts at the neck.  No L sided sx.  Doesn't radiate all the way to the R hand.  Waking up in pain.    Some better than prev but still bothersome.   Fall cautions d/w pt.  Sugar has been ~150 recently.  Per endocrine with adjustments pending/ongoing.     Meds, vitals, and allergies reviewed.   ROS: Per HPI unless specifically indicated in ROS section   nad ncat Neck supple, no midline posterior neck pain. Normal neck ROM.  R shoulder with pain on int/ext rotation with decreased pain with scap assist noted.  He clearly had increased range of motion and decreased pain with scapular manipulation. No arm drop. rrr Trace BLE edema.

## 2017-12-30 NOTE — Assessment & Plan Note (Signed)
Discussed with patient about options and anatomy.  It looks like he does have rotator cuff tendinitis.  He is clearly improved on range of motion with scapular manipulation here in the clinic. Use the shoulder exercises with home exercise program and try taking 1 extra gabapentin during the day as needed for pain.  If his shoulder continues to hurt, then ask about seeing Dr. Lorelei Pont here.  He agrees.  Okay for outpatient follow-up.

## 2018-01-10 ENCOUNTER — Ambulatory Visit (INDEPENDENT_AMBULATORY_CARE_PROVIDER_SITE_OTHER): Payer: Medicare Other | Admitting: Endocrinology

## 2018-01-10 ENCOUNTER — Encounter: Payer: Self-pay | Admitting: Endocrinology

## 2018-01-10 VITALS — BP 120/72 | HR 80 | Ht 69.0 in | Wt 254.8 lb

## 2018-01-10 DIAGNOSIS — E1165 Type 2 diabetes mellitus with hyperglycemia: Secondary | ICD-10-CM | POA: Diagnosis not present

## 2018-01-10 DIAGNOSIS — E890 Postprocedural hypothyroidism: Secondary | ICD-10-CM | POA: Diagnosis not present

## 2018-01-10 DIAGNOSIS — Z794 Long term (current) use of insulin: Secondary | ICD-10-CM | POA: Diagnosis not present

## 2018-01-10 NOTE — Patient Instructions (Signed)
Check blood sugars on waking up 7 days a week  Also check blood sugars about 2 hours after meals and do this after different meals by rotation  Recommended blood sugar levels on waking up are 90-130 and about 2 hours after meal is 130-160  Please bring your blood sugar monitor to each visit, thank you

## 2018-01-10 NOTE — Progress Notes (Signed)
Patient ID: Henry [name], male   DOB: 1950/04/27, 68 y.o.   MRN: [id number]           Reason for Appointment: Follow-up for Type 2 Diabetes   History of Present Illness:          Diagnosis: Type 2 diabetes mellitus, date of diagnosis: 2000      Past history:   Patient thinks he has been taking Amaryl for several years and probably metformin since onset also At some point he was changed from metformin to [facility] and was taking this since at least 2012 A1c had been higher in 2015 Since his A1c had been progressively higher with his regimen of Janumet and Amaryl he was started on Victoza in 01/2014 He was started on Lantus insulin in 5/16 because of persistent hyperglycemia especially fasting; was having readings as high as 293 Because of tendency to high postprandial readings and for control with Lantus he was switched to the V-go pump in  09/2014  Recent history:    INSULIN regimen: Omnipod insulin pump Basal rate programs: Midnight = 1.6 and 7 AM = 1.8 Boluses 4 units for breakfast and lunch and 8 units for dinner  Non-insulin hypoglycemic drugs the patient is taking are:  Victoza 1.8 mg daily in am   His A1c most recently 7.2 and improved No recent labs available  Current management, blood sugar patterns and problems identified:  He has been checking his blood sugars much less often than on his last visit  In the last week is checking readings mostly in the morning at breakfast time and only rarely later in the day  Around the time of holidays he was checking blood sugars 2-3 times a day and blood sugars were mostly over 200 nonfasting  FASTING blood sugars are mostly mildly increased only  His wife says that he is still not consistent with diet although not having as many snacks late at night  He is changing his OmniPod pod every 3 days roughly what sometimes he will get a notification during the night to change it  Again difficult to know what his postprandial  readings are with lack of adequate monitoring, blood sugars are mildly increased after breakfast  Boluses are not being adjusted much based on his meal size but occasionally may take as many as 3 boluses for a combination of a meal and snacks Hypoglycemia:   none  He has seen the dietitian in 3/16  Not able to exercise because of back pain and difficulty walking       Side effects from medications have been: None Compliance with the medical regimen: Fair   Glucose monitoring:  done 4-5 times  a day         Glucometer:  Contour      Blood Glucose readings by download of pump below           PRE-MEAL Fasting Lunch Dinner Bedtime Overall  Glucose range:  115-200  158, 184  189, 290    Mean/median:  144     164    Meals: 9 am , 2-3 pm and 6-7 pm            Dietician visit, most recent: 4/19               Weight history:   Wt Readings from Last 3 Encounters:  01/10/18 254 lb 12.8 oz (115.6 kg)  12/28/17 252 lb 8 oz (114.5 kg)  11/13/17 253 lb 4.8 oz (  114.9 kg)    Glycemic control:   Lab Results  Component Value Date   HGBA1C 7.2 (H) 11/08/2017   HGBA1C 8.4 (A) 09/19/2017   HGBA1C 9.6 05/17/2017   Lab Results  Component Value Date   MICROALBUR <0.7 02/05/2017   LDLCALC 118 (H) 09/16/2015   CREATININE 1.61 (H) 11/08/2017    Lab Results  Component Value Date   FRUCTOSAMINE 277 11/09/2017   FRUCTOSAMINE 350 (H) 03/29/2017   FRUCTOSAMINE 250 09/16/2015     Other active problems: See review of systems      Allergies as of 01/10/2018      Reactions   Penicillins Rash, Other (See Comments)   WEARS ALLERGY BRACELET Because of a history of documented adverse serious drug reaction;Medi Alert bracelet  is recommended PATIENT HAS HAD A PCN REACTION WITH IMMEDIATE RASH, FACIAL/TONGUE/THROAT SWELLING, SOB, OR LIGHTHEADEDNESS WITH HYPOTENSION:  #  #  YES  #  #  Has patient had a PCN reaction causing severe rash involving mucus membranes or skin necrosis: unknown Has  patient had a PCN reaction that required hospitalization NO Has patient had a PCN reaction occurring within the last 10 years: NO   Klonopin [clonazepam] Other (See Comments)   agitation   Peanut-containing Drug Products Cough   Watermelon [citrullus Vulgaris] Other (See Comments), Cough   Tickle in throat      Medication List       Accurate as of January 10, 2018  8:41 PM. Always use your most recent med list.        acetaminophen 500 MG tablet Commonly known as:  TYLENOL Take 500 mg by mouth 2 (two) times daily as needed for moderate pain or headache.   albuterol 108 (90 Base) MCG/ACT inhaler Commonly known as:  PROVENTIL HFA;VENTOLIN HFA Inhale 2 puffs into the lungs every 6 (six) hours as needed for wheezing or shortness of breath.   allopurinol 100 MG tablet Commonly known as:  ZYLOPRIM Take 1.5 tablets (150 mg total) by mouth daily.   AMBULATORY NON FORMULARY MEDICATION Ustep walker DX: G20   aspirin EC 81 MG tablet Take 81 mg by mouth daily.   carbidopa-levodopa 25-100 MG tablet Commonly known as:  SINEMET IR 2 TABLETS IN THE MORNING, 2 IN THE AFTERNOON, 1 IN THE EVENING, 1 AT BEDTIME   Carbidopa-Levodopa ER 25-100 MG tablet controlled release Commonly known as:  SINEMET CR Take 1-2 tablets by mouth See admin instructions. Take 2 tablets by mouth at 0700, take 2 tablets by mouth at 1200, take 1 tablet by mouth at 1600 and take 1 tablet by mouth at bedtime   cholecalciferol 1000 units tablet Commonly known as:  VITAMIN D Take 1,000 Units by mouth daily.   colchicine 0.6 MG tablet Take 1 tablet (0.6 mg total) by mouth 2 (two) times daily as needed (for gout flare up).   ferrous sulfate 325 (65 FE) MG EC tablet Take 325 mg by mouth 2 (two) times daily.   furosemide 80 MG tablet Commonly known as:  LASIX Take 1.5 tablets (120 mg total) by mouth daily.   gabapentin 100 MG capsule Commonly known as:  NEURONTIN Take 100-200 mg by mouth in the morning and take  300-400 mg by mouth at bedtime   glucose blood test strip Commonly known as:  ONETOUCH VERIO USE AS DIRECTED TO CHECK BLOOD SUGAR TWICE A DAY. DX:E11.65   insulin lispro 100 UNIT/ML injection Commonly known as:  HUMALOG Uses via insulin pump OmniPod Dash.  levothyroxine 125 MCG tablet Commonly known as:  SYNTHROID, LEVOTHROID Take 1 tablet (125 mcg total) by mouth daily before breakfast.   liraglutide 18 MG/3ML Sopn Commonly known as:  VICTOZA Inject 0.3 mLs (1.8 mg total) into the skin daily.   lisinopril 2.5 MG tablet Commonly known as:  PRINIVIL,ZESTRIL Take 1 tablet (2.5 mg total) by mouth daily.   metolazone 2.5 MG tablet Commonly known as:  ZAROXOLYN Take 1 tablet (2.5 mg total) by mouth daily as needed (for > 3lb in a day or > 5 lb in a week). Rx request sent to pharmacy.   metoprolol succinate 50 MG 24 hr tablet Commonly known as:  TOPROL-XL Take 1 tablet (50 mg total) by mouth daily.   multivitamin with minerals Tabs tablet Take 1 tablet by mouth daily.   MYRBETRIQ 50 MG Tb24 tablet Generic drug:  mirabegron ER Take 50 mg by mouth daily.   nitroGLYCERIN 0.4 MG SL tablet Commonly known as:  NITROSTAT Place 1 tablet (0.4 mg total) under the tongue every 5 (five) minutes as needed for chest pain.   OMNIPOD DASH SYSTEM Kit 1 each by Does not apply route. Use Dash system for Continuous Blood Glucose Monitoring.   OVER THE COUNTER MEDICATION Apply 1 application topically at bedtime. Theraworx Pain Cream   polyethylene glycol packet Commonly known as:  MIRALAX / GLYCOLAX Take 17 g by mouth daily as needed for moderate constipation.   potassium chloride SA 20 MEQ tablet Commonly known as:  KLOR-CON M20 Take 1 tablet (20 mEq total) by mouth 2 (two) times daily.   rosuvastatin 20 MG tablet Commonly known as:  CRESTOR TAKE 1 TABLET BY MOUTH EVERY DAY   traMADol 50 MG tablet Commonly known as:  ULTRAM Take 1 tablet (50 mg total) by mouth 2 (two) times daily  as needed for moderate pain.   travoprost (benzalkonium) 0.004 % ophthalmic solution Commonly known as:  TRAVATAN Place 1 drop into the right eye at bedtime.   vitamin B-12 1000 MCG tablet Commonly known as:  CYANOCOBALAMIN Take 1,000 mcg by mouth daily.   WIXELA INHUB 250-50 MCG/DOSE Aepb Generic drug:  Fluticasone-Salmeterol INHALE 1 PUFF INTO THE LUNGS 2 TIMES DAILY.       Allergies:  Allergies  Allergen Reactions  . Penicillins Rash and Other (See Comments)    WEARS ALLERGY BRACELET Because of a history of documented adverse serious drug reaction;Medi Alert bracelet  is recommended PATIENT HAS HAD A PCN REACTION WITH IMMEDIATE RASH, FACIAL/TONGUE/THROAT SWELLING, SOB, OR LIGHTHEADEDNESS WITH HYPOTENSION:  #  #  YES  #  #  Has patient had a PCN reaction causing severe rash involving mucus membranes or skin necrosis: unknown Has patient had a PCN reaction that required hospitalization NO Has patient had a PCN reaction occurring within the last 10 years: NO  . Klonopin [Clonazepam] Other (See Comments)    agitation  . Peanut-Containing Drug Products Cough  . Watermelon [Citrullus Vulgaris] Other (See Comments) and Cough    Tickle in throat    Past Medical History:  Diagnosis Date  . AICD (automatic cardioverter/defibrillator) present    Dr Lovena Le office visit yearly, MDT   . Arthritis    cane  . Asthma   . Benign neoplasm of colon   . CAD (coronary artery disease)   . Cardiomyopathy   . Cataract   . Complication of anesthesia    pt states that he got a rash  . Constipation   . 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 DM (diabetes mellitus) ([place])    TYPE 2   . GERD (gastroesophageal reflux disease)   . Glaucoma    right eye  . HLD (hyperlipidemia)   . HTN (hypertension)    pt denies 08/19/12  . Hyperplasia, prostate   . Hyperthyroidism    thyroid lobectomy  . MI (myocardial infarction) [facility])    Dr Stanford Breed 2000, x3vessels bypass  .  Nephrolithiasis   . OSA (obstructive sleep apnea)    AHI-28,on CPAP, noncompliant with CPAP  . Parkinson disease ([place])    1999  . PONV (postoperative nausea and vomiting)   . Restless legs   . Shortness of breath    Hx: of at all times  . Sleep apnea    does not use CPAP  . Sleep apnea, organic   . UTI (lower urinary tract infection) 09/15/12   Klebsiella  . Ventral hernia     Past Surgical History:  Procedure Laterality Date  . 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 CARDIAC CATHETERIZATION    . CATARACT EXTRACTION W/ INTRAOCULAR LENS IMPLANT     Hx: of right eye  . CATARACT EXTRACTION W/ INTRAOCULAR LENS IMPLANT Left 2018  . COLONOSCOPY N/A 10/13/2014   Procedure: COLONOSCOPY;  Surgeon: Jerene Bears, MD;  Location: [facility] ENDOSCOPY;  Service: Gastroenterology;  Laterality: N/A;  . COLONOSCOPY W/ BIOPSIES AND POLYPECTOMY     Hx: of  . CORONARY ARTERY BYPASS GRAFT  2000   [name], MD  . [place]  . DEEP BRAIN STIMULATOR PLACEMENT  2004   Right and left VIN stimulator placement (parkinsons)  . EYE SURGERY    . FINGER AMPUTATION     left pointer  . IMPLANTABLE CARDIOVERTER DEFIBRILLATOR IMPLANT N/A 11/13/2013   Procedure: IMPLANTABLE CARDIOVERTER DEFIBRILLATOR IMPLANT;  Surgeon: Evans Lance, MD;  [facility] LAB;  Service: Cardiovascular;  Laterality: N/A;  . INSERT / REPLACE / REMOVE PACEMAKER    . LEFT AND RIGHT HEART CATHETERIZATION WITH CORONARY ANGIOGRAM N/A 09/24/2013   Procedure: LEFT AND RIGHT HEART CATHETERIZATION WITH CORONARY ANGIOGRAM;  Surgeon: Burnell Blanks, MD;  Location: [facility] CATH LAB;  Service: Cardiovascular;  Laterality: N/A;  . LITHOTRIPSY     3 different times  . MEDIAN STERNOTOMY  2000  . PULSE GENERATOR IMPLANT Right 11/13/2017   Procedure: Right chest implantable pulse generator change;  Surgeon: Erline Levine, MD;  Location: [place];  Service: Neurosurgery;  Laterality: Right;  Right chest implantable  pulse generator change  . SUBTHALAMIC STIMULATOR BATTERY REPLACEMENT N/A 09/05/2012   Procedure: Deep brain stimulator battery change;  Surgeon: Erline Levine, MD;  Location: [place] [facility]URO ORS;  Service: Neurosurgery;  Laterality: N/A;  Deep brain stimulator battery change  . SUBTHALAMIC STIMULATOR BATTERY REPLACEMENT N/A 06/10/2015   Procedure: Deep Brain stimulator battery change;  Surgeon: Erline Levine, MD;  Location: [place] NEURO ORS;  Service: Neurosurgery;  Laterality: N/A;  . TONSILLECTOMY      Family History  Problem Relation Age of Onset  . Aneurysm Mother   . Alcoholism Father   . HIV/AIDS Brother 61       AIDS  . Healthy Child   . Peripheral vascular disease Unknown   . Arthritis Unknown   . Healthy Child   . Healthy Child   . Diabetes Neg Hx   . Heart disease Neg Hx   . Colon cancer Neg Hx   .  Prostate cancer Neg Hx     Social History:  reports that he has never smoked. He has never used smokeless tobacco. He reports current alcohol use. He reports that he does not use drugs.    Review of Systems     Most recent eye exam was done in 04/2017   HYPERLIPIDEMIA:  He is on Crestor 20 mg with good control of LDL, has low HDL Triglycerides are persistently over 200, he is supposed to be on Lovaza 2 g twice a day       Lab Results  Component Value Date   CHOL 123 04/04/2017   CHOL 111 10/16/2016   CHOL 132 12/06/2015   Lab Results  Component Value Date   HDL 32.30 (L) 04/04/2017   HDL 27.80 (L) 10/16/2016   HDL 30.20 (L) 12/06/2015   Lab Results  Component Value Date   LDLCALC 118 (H) 09/16/2015   LDLCALC 126 (H) 04/08/2013   LDLCALC 51 02/28/2012   Lab Results  Component Value Date   TRIG 232.0 (H) 04/04/2017   TRIG 287.0 (H) 10/16/2016   TRIG 217.0 (H) 12/06/2015   Lab Results  Component Value Date   CHOLHDL 4 04/04/2017   CHOLHDL 4 10/16/2016   CHOLHDL 4 12/06/2015   Lab Results  Component Value Date   LDLDIRECT 61.0 04/04/2017   LDLDIRECT 52.0  10/16/2016   LDLDIRECT 69.0 12/06/2015                  Thyroid:   He has had hypothyroidism following radioactive iodine treatment for hyperthyroidism in 2013  TSH is upper normal lately   Lab Results  Component Value Date   TSH 5.68 (H) 11/09/2017   TSH 4.57 (H) 05/17/2017   TSH 3.62 02/05/2017   FREET4 0.72 11/09/2017   FREET4 0.74 02/05/2017   FREET4 0.82 08/20/2014       HYPERTENSION has been followed by  other physicians   CKD: Has variable creatinine levels, usually consistently high   Lab Results  Component Value Date   CREATININE 1.61 (H) 11/08/2017   CREATININE 1.91 (H) 10/16/2017   CREATININE 2.28 (H) 10/11/2017    Neuropathy: Taking gabapentin as needed  Diabetic foot exam in 04/2017 showed  normal monofilament sensation in the toes except decreased on the right great toe and plantar surfaces, no skin lesions or ulcers on the feet and normal pedal pulses    Physical Examination:  BP 120/72 (BP Location: Left Arm, Patient Position: Sitting, Cuff Size: Normal)   Pulse 80   Ht _0  (1.753 m)   Wt 254 lb 12.8 oz (115.6 kg)   SpO2 96%   BMI 37.63 kg/m      ASSESSMENT/PLAN  Diabetes type 2, insulin-dependent with obesity     See history of present illness for detailed discussion of his current management, blood sugar patterns and problems identified  His A1c in late October was 7.2  Recent blood sugars are averaging 164 and were reviewed from his pump download  However most of his readings recently are only fasting which are as low as 113 With his not eating excessive snacks late at night his fasting readings are not usually high Although he did check some readings about a week or 2 ago at lunch and dinner they were probably high because of eating poorly and were mostly over 200 Discussed need to check sugars consistently at all meals and some after supper As before discussed that if he is able to check  4 times a day he may be able to get the  freestyle libre system  He will call if he are consistently high Discussed rotation of sites to apply the OmniPod on To make it simpler for changing the pods while awake he will try to change them consistently around 8-9 PM every 3 days regardless of when the notification is available  Since he is having difficulty with insurance coverage with his current system will contact the local representative for assistance with this  Continue Victoza Check A1c on the next visit  Hypothyroidism: Needs follow-up on the next visit  Patient Instructions  Check blood sugars on waking up 7 days a week  Also check blood sugars about 2 hours after meals and do this after different meals by rotation  Recommended blood sugar levels on waking up are 90-130 and about 2 hours after meal is 130-160  Please bring your blood sugar monitor to each visit, thank you     Counseling time on subjects discussed in assessment and plan sections is over 50% of today's 25 minute visit    Elayne Snare 01/10/2018, 8:41 PM   Note: This office note was prepared with Dragon voice recognition system technology. Any transcriptional errors that result from this process are unintentional.

## 2018-01-14 ENCOUNTER — Other Ambulatory Visit: Payer: Self-pay | Admitting: Endocrinology

## 2018-01-16 ENCOUNTER — Other Ambulatory Visit: Payer: Self-pay | Admitting: Cardiology

## 2018-01-22 ENCOUNTER — Other Ambulatory Visit: Payer: Self-pay | Admitting: Family Medicine

## 2018-01-22 NOTE — Telephone Encounter (Signed)
Electronic refill request. [place] Last office visit:   12/28/17 Last Filled:   08/03/17    #30   1 RF Please advise.

## 2018-01-23 ENCOUNTER — Ambulatory Visit (INDEPENDENT_AMBULATORY_CARE_PROVIDER_SITE_OTHER): Payer: Medicare Other

## 2018-01-23 DIAGNOSIS — I5022 Chronic systolic (congestive) heart failure: Secondary | ICD-10-CM | POA: Diagnosis not present

## 2018-01-23 DIAGNOSIS — I255 Ischemic cardiomyopathy: Secondary | ICD-10-CM

## 2018-01-23 NOTE — Telephone Encounter (Signed)
Sent. Thanks.   

## 2018-01-24 ENCOUNTER — Other Ambulatory Visit: Payer: Self-pay | Admitting: Cardiology

## 2018-01-24 NOTE — Progress Notes (Signed)
Remote ICD transmission.   

## 2018-01-24 NOTE — Telephone Encounter (Signed)
Rx request sent to pharmacy.  

## 2018-01-25 LAB — CUP PACEART REMOTE DEVICE CHECK
Battery Remaining Longevity: 75 mo
Battery Voltage: 2.99 V
Brady Statistic AP VP Percent: 0 %
Brady Statistic AP VS Percent: 0.74 %
Brady Statistic AS VS Percent: 99.22 %
Brady Statistic RA Percent Paced: 0.75 %
Brady Statistic RV Percent Paced: 0.03 %
Date Time Interrogation Session: 20200115062304
HighPow Impedance: 69 Ohm
Implantable Lead Implant Date: 20151105
Implantable Lead Implant Date: 20151105
Implantable Lead Location: 753859
Implantable Lead Model: 5076
Implantable Pulse Generator Implant Date: 20151105
Lead Channel Impedance Value: 437 Ohm
Lead Channel Impedance Value: 456 Ohm
Lead Channel Impedance Value: 494 Ohm
Lead Channel Pacing Threshold Amplitude: 0.375 V
Lead Channel Pacing Threshold Amplitude: 1.125 V
Lead Channel Pacing Threshold Pulse Width: 0.4 ms
Lead Channel Pacing Threshold Pulse Width: 0.4 ms
Lead Channel Sensing Intrinsic Amplitude: 18.5 mV
Lead Channel Sensing Intrinsic Amplitude: 18.5 mV
Lead Channel Sensing Intrinsic Amplitude: 2.375 mV
Lead Channel Sensing Intrinsic Amplitude: 2.375 mV
Lead Channel Setting Pacing Amplitude: 2.25 V
Lead Channel Setting Pacing Amplitude: 2.5 V
Lead Channel Setting Sensing Sensitivity: 0.3 mV
MDC IDC LEAD LOCATION: 753860
MDC IDC SET LEADCHNL RV PACING PULSEWIDTH: 0.4 ms
MDC IDC STAT BRADY AS VP PERCENT: 0.03 %

## 2018-01-28 DIAGNOSIS — Z125 Encounter for screening for malignant neoplasm of prostate: Secondary | ICD-10-CM | POA: Diagnosis not present

## 2018-01-28 DIAGNOSIS — N3281 Overactive bladder: Secondary | ICD-10-CM | POA: Diagnosis not present

## 2018-02-08 NOTE — Progress Notes (Signed)
Henry [name] was seen today in the movement disorders clinic for neurologic consultation at the request of Henry [name].  His PCP is Henry Ghent, MD.  The consultation is for the evaluation of PD and to manage his DBS.  He is accompanied by his wife who supplements the history.  The first symptom(s) the patient noticed was right hand tremor in 1999.  He was seen by neurology and was dx with PD.  He was placed on something, but it caused sleepiness.  He does not think that he has ever been on levodopa.   He was placed on Mirapex, which seemed to help.  He only takes it twice per day.  He didn't think it made a difference when he took it tid.  He began to have tremor and it was suggested he do DBS.    The pt is s/p stn DBS in 2005.  He had a battery change in 2009.    10/02/12 update: The patient is accompanied today by his daughter, who supplements the history.  I reviewed medical records available to me since last visit.  The patient had his generator changed on 09/05/2012.  He remains on pramipexole, 0.5 mg twice per day.  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 Brett [name] in that regard.  He is not compliant with CPAP so says that he doesn't want to return to her for f/u. He doesn't take the klonopin faithfully.  10/11/12 update:  Pt was seen as a walk in/work in today.  Pt had increasing tremor, L greater than R for 3 days.  Thinks that it started with the d/c of artane.  Speech stable.  11/19/12 update:  Pt is seen today for his PD, accompanied by his daughter who supplements the hx.    He is currently on klonopin 0.5 mg - 1/2-1 tablet q hs.  He only takes it when his wife is not working.  She works nights and is only in 2 days per week.  He has some reluctance to take it other nights.  He is on pramipexole 0.5 mg bid.  Last visit, his DBS was reset more similar to the settings he had prior to coming here, just with an increased voltage.  About 2 weeks after our last  visit, the patient decided to go back on the Artane.  The combination of the Artane and the DBS changes helped significantly.  He does ask if I can slightly increased voltage on the left hand, as he has some tremor at night that is bothersome.  Otherwise, he is doing well.  He feels that his balance has been great.  02/19/13 update:  This patient is accompanied in the office by his child who supplements the history.  The pt has a hx of PD.  He has been tremor free and is very happy about that.  He has had some increased balance loss; the last fall was a few months ago but he hasn't gotten hurt.  Was considering hernia surgery.  The records that were made available to me were reviewed.  He is holding on that for now but plans to have it done before end of year.  No hallucinations.  He is still having some acting out of the dreams, despite clonazepam.  06/19/13 update:  Patient is returning to followup regarding his Parkinson's disease.  I had the opportunity to review records since last visit.  He went to the emergency room on  05/12/2013 with chest pain and shortness of breath.  He had been fishing and had missed several doses of Lasix.  He had been eating seafood.  He ended up with an acute exacerbation of his chronic congestive heart failure.  Once he was diuresed and back on his medications, he is feeling better.  He just had a nuclear medicine study done on 06/17/2013 and the ejection fraction on this looked better than on his echocardiogram.  The left ventricular ejection fraction was 41%.  Prior to that, there was some concern that his left ventricular ejection fraction had dropped so much that he may need an ICD.  In terms of Parkinson's disease, the patient states that he has been doing very well in terms of tremor, but his walking really has deteriorated.  He describes a festinating gait, with much more shuffling.  He fell walking over his dog gate but otherwise has not had falls.  He went fishing  yesterday and states that he "stumbled all day long."  No hallucinations.  He has been trying to be more faithful with his CPAP. He remains on Mirapex 0.5 mg twice a day, Artane 2 mg twice a day.  10/23/13 update:  The patient returns today to the clinic, accompanied by his wife who supplements the history.  From a Parkinson's standpoint, the patient states that he has been doing very well.  No tremor.  He started on levodopa last visit and states that he has had no falls and overall stumbling has been much better.  He states that the only time that he stumbles is if he is inside of his fishing boat, and states that that is really because it is small and unstable.  He is taking his levodopa in the morning, after lunch and bedtime.  He has had no hallucinations.  His wife states that he is still draining and acting out the dreams and even fell out of bed a few days ago.  However, he is taking his clonazepam about 10 PM but doesn't go to bed until 1 AM.  He is not using his CPAP.  Is having significant constipation.   I reviewed his cardiology records since last visit.  He did have a heart catheterization and is scheduled to have an ICD placed on November 5.  02/23/14 update:  Pt returns for follow up accompanied by his wife who supplements the history.  The records that were made available to me were reviewed since last visit.  He had an ICD placed on 11/15/13.  He is doing well.  Taking carbidopa/levodopa 25/100 three times a day and remains on artane as well as mirapex 0.5 mg bid.  Had a fall up the stairs.  Wife describes festinating gate and pt thinks that levodopa contributes.  He also doesn't feel good much of the time and thinks that is from levodopa.  Admits, however, that when BS under good control, he feels good.  Has started seeing endocrinology and started on new meds.  He has klonopin for RBD.  Still vivid dreams but no falling out bed (in recliner now).  Having some word finding trouble and memory  doesn't seem clear.  Also, put back tailgate down on car and came down on generator and wants me to look at that.    07/01/14 update:  The patient follows up today regarding his Parkinson's disease.  He is accompanied by his wife who supplements the history.  Last visit, the patient was complaining about memory change which  I thought was likely multifactorial and due to medication such as Artane, Mirapex and Ultram, which he was taking every 3 hours.  The patient, however, thought that it was from levodopa and so we held it and the patient reported that he felt better and therefore continued to stay off of the medication.  He reports that falls are better after d/c levodopa but wife reports still falls.  Pt states that he only fell in shower after he d/c levodopa.  His wife does admit that she thinks that levodopa was causing loss of balance.  He is having more tremor over the last month on the L hand.   He remains on clonazepam 0.5 mg, 1-1/2 tablets at night for REM behavior disorder.  I reviewed records since last visit.  He has seen Dr. Dwyane Dee multiple times in regards to his uncontrolled diabetes.  He started on insulin on May 22 2014.  He states that he is doing well with injecting himself.  Having a lot of constipation.  Has the rancho recipe.    11/03/14 update:  The patient is following up today, accompanied by his wife who supplements the history.  Records were reviewed since our last visit.  The patient is on pramipexole 0.5 mg twice a day and Artane 2 mg twice a day.  He has not had any hallucinations.  Had a fall the other day walking up stairs carrying something and fell backward.  Hit his back.  No LOC.  Also fell backward in the summer around the pool.  [place] one time out of his boat.  Doesn't want to use a walker. He was in the emergency room recently with a rash that was felt secondary to insect bites that he got in the woods.  He also recently had a colonoscopy.  His diabetes is under better  control and his last A1c was 7.2.  States that he now has an insulin pump.  He remains on clonazepam 0.5 mg, 1-1/2 tablets at night for REM behavior disorder.  More back pain lately and will start injections for the back which have helped previously  02/04/15 update:  The patient presents today, accompanied by his wife who supplements the history.  I have reviewed prior records made available to me.  He has established a new primary care doctor in Dr. Damita Dunnings.  The patient remains on Artane, 2 mg twice a day as well as pramipexole 0.5 mg twice a day.  Overall, he has been doing fairly well.  He occasionally still has some tremor.  He had 2 falls since our last visit; he states that he was pushing a gate open and it came back and hit him.  He was also carrying wood the other day and fell forward with wood.  No hallucinations.  No lightheadedness or near syncope.  He remains on clonazepam 0.5 mg, 1-1/2 tablets at night for REM behavior disorder.  [facility] is [facility] at [facility].    06/08/15 update:  This patient is accompanied in the office by his spouse who supplements the history.  The patient remains on Artane, 2 mg twice a day as well as pramipexole 0.5 mg twice a day.  He tripped over the curb at [employer].  He tripped over the step on his deck.   He remains on clonazepam 0.5 mg, 1-1/2 tablets at night for REM behavior disorder.  He is still having trouble with that.  He fell out of bed the other night.  Wife asks about bed rails.   He felt that [facility] helped.  Is looking forward to getting in his pool.    09/09/15 update:  The patient follows up today, accompanied by his wife who supplements the history.  He is on pramipexole 0.5 g twice a day and trihexyphenidyl 2 mg twice a day.  Didn't go up to the 1.5 mg of pramipexole.   He is on clonazepam 0.5 mg, 1-1/2 tablets at night for REM behavior disorder.  His biggest issue is RLS at bedtime.  It is preventing him from going to sleep.    He denies any hallucinations.  He denies cognitive change.  He continues to struggle with balance and has fallen/stumbled a few times.  He has fallen in the boat.  Wife describes festinating gait.  Wife also describes crying frequently.  Denies depression. He has his battery changed on 06/10/15  12/18/15 update:  Pt is following up today, accompanied by his wife who supplements the history.  Last visit, I changed him from pramipexole, 0.5 mg twice per day to Mirapex ER, 1.5 mg once per day.  He states that it is very expensive.  He remains on trihexyphenidyl, 2 mg twice per day.  He is also on clonazepam 0.5 mg, 1-1/2 tablets at night for REM behavior disorder. It turns out he was taking this at 6pm.   I reviewed records since last visit.  He reports that his pharmacist to come off his Aldactone because of an interaction with his Parkinson's medicine.  I am unaware of any interaction with his current medications.  Been having issues with constipation, which has been treated by Dr. Damita Dunnings.  [facility] is in [facility].  When asked about falls, his wife states that he falls all the time but he is more conscious of festinating gait now that he is in PT.  Not gotten hurt with falls.    07/13/16 update:  Pt f/u today for PD, accompanied by his wife, who supplements the history.  The records that were made available to me were reviewed since last visit.  On pramipexole ER 1.5 mg daily and artane 2 mg bid.  Was on klonopin for RBD but he d/c the medication.  He thought that it caused insomnia.  He is still acting out the dreams.  He won't use his CPAP machine.  States that he is afraid of it malfunctioning.  Often will sleep in the recliner.  Having more back pain.  Having injections and 3rd is next week at [facility].    Wearing off:  No.  How long before next dose:  n/a Falls:   Yes.  , 12 months but some of these were falls out of bed in RBD N/V:  No. Hallucinations:  Yes.   (none visual but few auditory and has  gotten out of bed to see who was there)  visual distortions: No. Lightheaded:  No.  Syncope: No. Dyskinesia:  No.   12/15/16 update: Patient seen today in follow-up for his Parkinson's disease.  He is accompanied by his wife who supplements the history.  He remains on pramipexole ER, 1.5 mg daily.  He is also on trihexyphenidyl, 2 mg.  He was taking bid but he looked at his bottle and realized he was supposed to be taking it qd and just started doing that.  He has not had any hallucinations, except a few arising in the middle of the night out of a vivid dream.  No compulsive  behaviors.  I have reviewed records made available to me from his other providers, including his endocrinologist, cardiologist and primary care physician.  Off of klonopin.  "If I take it I sleep the entire next day."  He is having vivid dreams.  States that he has not fallen that much until last night.  Wife describes festinating gait.  Having more trouble in the shower.  A shower chair will not fit in his shower.  They have a small corner seat, but he states that he cannot wash his feet if he sits in it.  It is too slippery.  He is a currently attending [facility] through [facility] for his low back pain.  He does like that.  04/20/17 update: Patient is seen today in follow-up for his Parkinson's disease.  Patient is accompanied by his wife who supplements the history.  Patient is on pramipexole, 1.5 mg daily.  He is also on Artane, 2 mg daily.  No hallucinations.  No lightheadedness or near syncope.  I have talked to his primary care physician about him.  Primary care wanted to get an MRI of his brain due to hearing loss. 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  Discussed with his primary care physician that if he needed MRI, his device is MRI compatible for head MRI, but would need [employer] rep there to turn off the device and make sure  impedances were normal the day of MRI. Has new hearing aid as of today and doing well with that.   [place] today and tried to get off of toilet and hit door knob and fell.  Still going fishing and friend helps him navigate the water.  Went yesterday. Having more renal dysfunction and urinary urgency and dysuria.  No UTI per urology and placed on myrbetriq.  Has referral to nephrology but has not heard from them yet.  06/29/17 update: Patient is seen today in follow-up for Parkinson's.  We tried to discontinue his trihexyphenidyl, but they felt that his walking got worse after discontinuing the medication.  It was restarted, but I told them to trial off of it again as trihexyphenidyl generally has a negative effect on walking and not positive.  He ended up staying on the medication.  Overall, they do think that balance has been worse as has been acting out of the dreams.  He has had 2-3 falls.  He is having freezing.  Battery site sore since fall.   He describes hallucinations but they arise out of the sleep.  He will start dreaming and it feels real.  He states that it goes away when wakes up.  Wife isn't so sure.  He is tried clonazepam but that it made him too sleepy.  wife states that she restarted klonopin a week ago, 0.5 mg, and wife thinks that it has helped sleep but still having a bit of that.  He is still on pramipexole ER, 1.5 mg daily.    07/23/17 update: Patient is seen today in follow-up for Parkinson's.  His device was adjusted last visit and I also had him start back on carbidopa/levodopa 25/100 and work to 1 tablet 3 times per day.  His pramipexole was decreased to 0.25 mg tid.  Today, he reports that he is feeling more stable with this change in meds.  He is back on klonopin and sleeping well.  He  fell in the kitchen.  He was near the stove and went to reach for the refrigerator and fell and glasses cut his face.  He had his walker near him but wasn't using it because "I didn't need it."     He is  having trouble remembering middle of the day dosing but generally takes med at 8am/2pm/8pm (didn't take 2 pm today and seen at 3pm)  10/25/17 update: Patient is seen today in follow-up for Parkinson's disease, accompanied by his wife who supplements history.  He is supposed to be on carbidopa/levodopa 25/100, 2/2/1 but is still only on carbidopa/levodopa 25/100 tid.  He reports that he started on the correct dosage initially but had old bottle and was running out early. Wife states that he thinks she is trying to harm him when given proper dose since old directions were on the bottle.   He then tells me "it makes me too dizzy."    He is off of pramipexole and artane.  Multiple falls but falling always without the walker.  Has hit head hard with fall onto oak cabinet and worries because tremor worse after that.   More cramping in the feet at night.    02/11/18 update: Patient is seen today in follow-up for Parkinson's disease, accompanied by his wife who supplements the history.  I discontinued his immediate release levodopa last visit because of dizziness.  I started him on carbidopa/levodopa 25/100 CR and told him to take 2 tablets at 7 AM, 2 tablets at noon, 1 tablet at 4 PM and 1 tablet at bedtime, in hopes that it would help the nighttime cramping.  He states that it helped but he also got some shot in his foot that has helped.  He rubs some cream on it that he thinks helps.  Last visit, I encouraged him to get a U step walker given the falls.  He is using a walker some at home but not always.   He still falls.  He actually pulled his kitchen cabinet out of the wall due to fall.  He is exercising at home.  He is walking straight ahead better now that he is exercising but has trouble in the turns.   His DBS battery was changed on November 13, 2017.  He thinks that he is shaking more since the battery change - L leg and R arm.   Records have been reviewed.  He has seen his primary care physician about shoulder  pain.  He has been following with Dr. Dwyane Dee regarding his diabetes.  tells me about facial paresthesias - "I feel like i've been having on my glasses when I don't."   Involves the face/cheek/forehead on the right.  First says it started a year ago but then wife states that it was a few months ago.  Had a CT after our last visit and isn't sure if had the sx then.    PREVIOUS MEDICATIONS: Mirapex and artane, levodopa (made "too loose"); clonazepam (overly sleepy)  ALLERGIES:   Allergies  Allergen Reactions  . Penicillins Rash and Other (See Comments)    WEARS ALLERGY BRACELET Because of a history of documented adverse serious drug reaction;Medi Alert bracelet  is recommended PATIENT HAS HAD A PCN REACTION WITH IMMEDIATE RASH, FACIAL/TONGUE/THROAT SWELLING, SOB, OR LIGHTHEADEDNESS WITH HYPOTENSION:  #  #  YES  #  #  Has patient had a PCN reaction causing severe rash involving mucus membranes or skin necrosis: unknown Has patient had a PCN reaction  that required hospitalization NO Has patient had a PCN reaction occurring within the last 10 years: NO  . Klonopin [Clonazepam] Other (See Comments)    agitation  . Peanut-Containing Drug Products Cough  . Watermelon [Citrullus Vulgaris] Other (See Comments) and Cough    Tickle in throat    CURRENT MEDICATIONS:   Allergies as of 02/11/2018      Reactions   Penicillins Rash, Other (See Comments)   WEARS ALLERGY BRACELET Because of a history of documented adverse serious drug reaction;Medi Alert bracelet  is recommended PATIENT HAS HAD A PCN REACTION WITH IMMEDIATE RASH, FACIAL/TONGUE/THROAT SWELLING, SOB, OR LIGHTHEADEDNESS WITH HYPOTENSION:  #  #  YES  #  #  Has patient had a PCN reaction causing severe rash involving mucus membranes or skin necrosis: unknown Has patient had a PCN reaction that required hospitalization NO Has patient had a PCN reaction occurring within the last 10 years: NO   Klonopin [clonazepam] Other (See Comments)    agitation   Peanut-containing Drug Products Cough   Watermelon [citrullus Vulgaris] Other (See Comments), Cough   Tickle in throat      Medication List       Accurate as of February 11, 2018  4:08 PM. Always use your most recent med list.        acetaminophen 500 MG tablet Commonly known as:  TYLENOL Take 500 mg by mouth 2 (two) times daily as needed for moderate pain or headache.   albuterol 108 (90 Base) MCG/ACT inhaler Commonly known as:  PROVENTIL HFA;VENTOLIN HFA Inhale 2 puffs into the lungs every 6 (six) hours as needed for wheezing or shortness of breath.   allopurinol 100 MG tablet Commonly known as:  ZYLOPRIM Take 1.5 tablets (150 mg total) by mouth daily.   AMBULATORY NON FORMULARY MEDICATION Ustep walker DX: G20   aspirin EC 81 MG tablet Take 81 mg by mouth daily.   Carbidopa-Levodopa ER 25-100 MG tablet controlled release Commonly known as:  SINEMET CR Take 1-2 tablets by mouth See admin instructions. Take 2 tablets by mouth at 0700, take 2 tablets by mouth at 1200, take 1 tablet by mouth at 1600 and take 1 tablet by mouth at bedtime   cholecalciferol 1000 units tablet Commonly known as:  VITAMIN D Take 1,000 Units by mouth daily.   colchicine 0.6 MG tablet TAKE 1 TABLET BY MOUTH TWICE A DAY AS NEEDED FOR GOUT   ferrous sulfate 325 (65 FE) MG EC tablet Take 325 mg by mouth 2 (two) times daily.   furosemide 80 MG tablet Commonly known as:  LASIX Take 1.5 tablets (120 mg total) by mouth daily.   gabapentin 100 MG capsule Commonly known as:  NEURONTIN Take 100-200 mg by mouth in the morning and take 300-400 mg by mouth at bedtime   glucose blood test strip Commonly known as:  ONETOUCH VERIO USE AS DIRECTED TO CHECK BLOOD SUGAR TWICE A DAY. DX:E11.65   insulin lispro 100 UNIT/ML injection Commonly known as:  HUMALOG Uses via insulin pump OmniPod Dash.   levothyroxine 125 MCG tablet Commonly known as:  SYNTHROID, LEVOTHROID TAKE 1 TABLET EVERY  DAY DAILY BEFORE BREAKFAST   liraglutide 18 MG/3ML Sopn Commonly known as:  VICTOZA Inject 0.3 mLs (1.8 mg total) into the skin daily.   lisinopril 2.5 MG tablet Commonly known as:  PRINIVIL,ZESTRIL Take 1 tablet (2.5 mg total) by mouth daily.   metolazone 2.5 MG tablet Commonly known as:  ZAROXOLYN TAKE 1 TABLET BY  MOUTH EVERY DAY AS NEEDED FOR > 3LB IN A DAY OR > 5LB IN A WEEK   metoprolol succinate 50 MG 24 hr tablet Commonly known as:  TOPROL-XL Take 1 tablet (50 mg total) by mouth daily.   multivitamin with minerals Tabs tablet Take 1 tablet by mouth daily.   MYRBETRIQ 50 MG Tb24 tablet Generic drug:  mirabegron ER Take 50 mg by mouth daily.   nitroGLYCERIN 0.4 MG SL tablet Commonly known as:  NITROSTAT Place 1 tablet (0.4 mg total) under the tongue every 5 (five) minutes as needed for chest pain.   OMNIPOD DASH SYSTEM Kit 1 each by Does not apply route. Use Dash system for Continuous Blood Glucose Monitoring.   OVER THE COUNTER MEDICATION Apply 1 application topically at bedtime. Theraworx Pain Cream   polyethylene glycol packet Commonly known as:  MIRALAX / GLYCOLAX Take 17 g by mouth daily as needed for moderate constipation.   potassium chloride SA 20 MEQ tablet Commonly known as:  KLOR-CON M20 Take 1 tablet (20 mEq total) by mouth 2 (two) times daily.   rosuvastatin 20 MG tablet Commonly known as:  CRESTOR TAKE 1 TABLET BY MOUTH EVERY DAY   traMADol 50 MG tablet Commonly known as:  ULTRAM Take 1 tablet (50 mg total) by mouth 2 (two) times daily as needed for moderate pain.   travoprost (benzalkonium) 0.004 % ophthalmic solution Commonly known as:  TRAVATAN Place 1 drop into the right eye at bedtime.   vitamin B-12 1000 MCG tablet Commonly known as:  CYANOCOBALAMIN Take 1,000 mcg by mouth daily.   WIXELA INHUB 250-50 MCG/DOSE Aepb Generic drug:  Fluticasone-Salmeterol INHALE 1 PUFF INTO THE LUNGS 2 TIMES DAILY.        PAST MEDICAL HISTORY:     Past Medical History:  Diagnosis Date  . AICD (automatic cardioverter/defibrillator) present    Dr Lovena Le office visit yearly, MDT   . Arthritis    cane  . Asthma   . Benign neoplasm of colon   . CAD (coronary artery disease)   . Cardiomyopathy   . Cataract   . Complication of anesthesia    pt states that he got a rash  . Constipation   . 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 DM (diabetes mellitus) ([place])    TYPE 2   . GERD (gastroesophageal reflux disease)   . Glaucoma    right eye  . HLD (hyperlipidemia)   . HTN (hypertension)    pt denies 08/19/12  . Hyperplasia, prostate   . Hyperthyroidism    thyroid lobectomy  . MI (myocardial infarction) [facility] [facility])    Dr Stanford Breed 2000, x3vessels bypass  . Nephrolithiasis   . OSA (obstructive sleep apnea)    AHI-28,on CPAP, noncompliant with CPAP  . Parkinson disease ([place])    1999  . PONV (postoperative nausea and vomiting)   . Restless legs   . Shortness of breath    Hx: of at all times  . Sleep apnea    does not use CPAP  . Sleep apnea, organic   . UTI (lower urinary tract infection) 09/15/12   Klebsiella  . Ventral hernia     PAST SURGICAL HISTORY:   Past Surgical History:  Procedure Laterality Date  . 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 CARDIAC CATHETERIZATION    . CATARACT EXTRACTION W/ INTRAOCULAR LENS IMPLANT     Hx: of right eye  . CATARACT  EXTRACTION W/ INTRAOCULAR LENS IMPLANT Left 2018  . COLONOSCOPY N/A 10/13/2014   Procedure: COLONOSCOPY;  Surgeon: Jerene Bears, MD;  Location: [facility];  Service: Gastroenterology;  Laterality: N/A;  . COLONOSCOPY W/ BIOPSIES AND POLYPECTOMY     Hx: of  . CORONARY ARTERY BYPASS GRAFT  2000   [name], MD  . [place]  . DEEP BRAIN STIMULATOR PLACEMENT  2004   Right and left VIN stimulator placement (parkinsons)  . EYE SURGERY    . FINGER AMPUTATION     left pointer  . IMPLANTABLE CARDIOVERTER  DEFIBRILLATOR IMPLANT N/A 11/13/2013   Procedure: IMPLANTABLE CARDIOVERTER DEFIBRILLATOR IMPLANT;  Surgeon: Evans Lance, MD;  Location: [facility] CATH LAB;  Service: Cardiovascular;  Laterality: N/A;  . INSERT / REPLACE / REMOVE PACEMAKER    . LEFT AND RIGHT HEART CATHETERIZATION WITH CORONARY ANGIOGRAM N/A 09/24/2013   Procedure: LEFT AND RIGHT HEART CATHETERIZATION WITH CORONARY ANGIOGRAM;  Surgeon: Burnell Blanks, MD;  Location: [facility] CATH LAB;  Service: Cardiovascular;  Laterality: N/A;  . LITHOTRIPSY     3 different times  . MEDIAN STERNOTOMY  2000  . PULSE GENERATOR IMPLANT Right 11/13/2017   Procedure: Right chest implantable pulse generator change;  Surgeon: Erline Levine, MD;  Location: [place];  Service: Neurosurgery;  Laterality: Right;  Right chest implantable pulse generator change  . SUBTHALAMIC STIMULATOR BATTERY REPLACEMENT N/A 09/05/2012   Procedure: Deep brain stimulator battery change;  Surgeon: Erline Levine, MD;  Location: [place] NEURO ORS;  Service: Neurosurgery;  Laterality: N/A;  Deep brain stimulator battery change  . SUBTHALAMIC STIMULATOR BATTERY REPLACEMENT N/A 06/10/2015   Procedure: Deep Brain stimulator battery change;  Surgeon: Erline Levine, MD;  Location: [place] NEURO ORS;  Service: Neurosurgery;  Laterality: N/A;  . TONSILLECTOMY      SOCIAL HISTORY:   Social History   Socioeconomic History  . Marital status: Married    Spouse name: [name]  . Number of children: 2  . Years of education: Not on file  . Highest education level: Not on file  Occupational History  . Occupation: DISABLED    Comment: [employer]  Social Needs  . Financial resource strain: Not on file  . Food insecurity:    Worry: Not on file    Inability: Not on file  . Transportation needs:    Medical: Not on file    Non-medical: Not on file  Tobacco Use  . Smoking status: Never Smoker  . Smokeless tobacco: Never Used  Substance and Sexual Activity  . Alcohol use: Yes    Comment:  occasional beer  . Drug use: No  . Sexual activity: Not on file  Lifestyle  . Physical activity:    Days per week: Not on file    Minutes per session: Not on file  . Stress: Not on file  Relationships  . Social connections:    Talks on phone: Not on file    Gets together: Not on file    Attends religious service: Not on file    Active member of club or organization: Not on file    Attends meetings of clubs or organizations: Not on file    Relationship status: Not on file  . Intimate partner violence:    Fear of current or ex partner: Not on file    Emotionally abused: Not on file    Physically abused: Not on file    Forced sexual activity: Not on file  Other Topics  Concern  . Not on file  Social History Narrative   From [facility]   Retired/disability Clinical research associate   Likes to fish.     Married 1972   3 kids   [place] fan    FAMILY HISTORY:   Family Status  Relation Name Status  . Mother  Deceased       complications of surgery  . Father  Deceased  . Brother  Alive       1 brother, 1 half brother  . Sister  Alive       2 half sisters  . Child  Alive  . Other  (Not Specified)  . MGM  Deceased  . MGF  Deceased  . PGM  Deceased  . PGF  Deceased  . Sister  Alive  . Brother  Alive  . Child  Alive  . Child  Alive  . Neg Hx  (Not Specified)    ROS:  Review of Systems  Constitutional: Positive for malaise/fatigue.  HENT: Negative.   Eyes: Negative.   Respiratory: Negative.   Cardiovascular: Negative.   Gastrointestinal: Negative.   Genitourinary: Negative.   Musculoskeletal: Positive for falls.  Skin: Negative.      PHYSICAL EXAMINATION:    VITALS:   Vitals:   02/11/18 1432  BP: 110/70  Pulse: 82  SpO2: 97%  Weight: 256 lb 8 oz (116.3 kg)  Height: _0  (1.753 m)   GEN:  The patient appears stated age and is in NAD. HEENT:  Normocephalic, atraumatic.  The mucous membranes are moist. The superficial temporal arteries are without  ropiness or tenderness. CV:  RRR Lungs:  CTAB Neck/HEME:  There are no carotid bruits bilaterally. Skin: The skin/incision site is well-healed over the new battery site on the right chest.  However, the wire is visible underneath the skin and is quite tight, but the patient has full range of motion of the neck.  Neurological examination:  Orientation: The patient is alert and oriented x3. Cranial nerves: There is good facial symmetry. The speech is fluent and less dysarthric than previous visits. Soft palate rises symmetrically and there is no tongue deviation. Hearing is intact to conversational tone. Sensation: Sensation is intact to light touch throughout Motor: Strength is at least antigravity x4.   Shoulder shrug is equal and symmetric.  There is no pronator drift.  Movement examination: Tone: There is normal tone in the upper and lower extremities Abnormal movements: Prior to programming, there is just rare and mild independent tremor in both upper extremities. Coordination:  There is  decremation with RAM's, mostly in the foot on the left. Gait and Station: The patient pushes off the chair.  He uses his walker to ambulate and does well with that although he does freeze in the turn.  Without the walker, he has significant festination.         LABS  Lab Results  Component Value Date   WBC 7.5 11/08/2017   HGB 12.5 (L) 11/08/2017   HCT 40.3 11/08/2017   MCV 88.0 11/08/2017   PLT 198 11/08/2017   Lab Results  Component Value Date   TSH 5.68 (H) 11/09/2017     Chemistry      Component Value Date/Time   NA 138 11/08/2017 1324   NA 142 12/22/2016 1233   K 3.7 11/08/2017 1324   CL 100 11/08/2017 1324   CO2 30 11/08/2017 1324   BUN 22 11/08/2017 1324   BUN 33 (H) 12/22/2016 1233  CREATININE 1.61 (H) 11/08/2017 1324   CREATININE 2.13 06/25/2017   CREATININE 1.53 (H) 05/22/2016 1106      Component Value Date/Time   CALCIUM 8.8 (L) 11/08/2017 1324   ALKPHOS 80  05/17/2017 1439   AST 14 05/17/2017 1439   ALT 25 05/17/2017 1439   BILITOT 0.3 05/17/2017 1439     Lab Results  Component Value Date   VITAMINB12 956 (H) 02/22/2017   Lab Results  Component Value Date   HGBA1C 7.2 (H) 11/08/2017   DBS programming was performed today, which is described in more detail on a separate programming procedural notes.   ASSESSMENT/PLAN:  1.  Idiopathic Parkinson's disease.    -Now off trihexyphenidyl and pramipexole  -His DBS battery was last changed on 09/05/2012 and on 06/10/15 and November 13, 2017.  Encouraged the patient to move around the neck to avoid further scarring around the wire on the right.  -use walker at all times  -Continue carbidopa/levodopa 25/100 CR, 2 tablets at 7am, 2 at noon, 1 at 4pm, 1 at bedtime.  Hopeful that this will help the cramping.  -Will let me know if he has any focal or lateralizing neuro symptoms.  He describes some facial paresthesias, but then says he has had them for a year, but then his wife thinks it has been just a few months.  I did reprogram the DBS.  -[facility] LSVT [facility]. 2.  RBD  -He has tried clonazepam several times and ultimately ends up stopping it because of side effect.  Could try Rozerem in the future if insurance would pay.  Decided to hold off for now as the patient states that while dreaming is vivid, it is not particularly bothersome and he is not falling out of the bed 3.  Pseudobulbar affect (crying)  -Wants no medication for that. 4.  OSAS, noncompliant with CPAP.  -Unfortunately, he has been unable to tolerate CPAP.  Talked about dental appliances, which he does not wish to try either. 5.  CHF  -ICD placed on 11/15/2013 6. Follow up is anticipated in the next few months, sooner should new neurologic issues arise.  Much greater than 50% of this visit was spent in counseling and coordinating care.  Total face to face time:  25 min not including the 10 min  of dbs time

## 2018-02-11 ENCOUNTER — Encounter: Payer: Self-pay | Admitting: Neurology

## 2018-02-11 ENCOUNTER — Encounter

## 2018-02-11 ENCOUNTER — Ambulatory Visit (INDEPENDENT_AMBULATORY_CARE_PROVIDER_SITE_OTHER): Payer: Medicare Other | Admitting: Neurology

## 2018-02-11 VITALS — BP 110/70 | HR 82 | Ht 69.0 in | Wt 256.5 lb

## 2018-02-11 DIAGNOSIS — G20 Parkinson's disease: Secondary | ICD-10-CM | POA: Diagnosis not present

## 2018-02-11 DIAGNOSIS — I255 Ischemic cardiomyopathy: Secondary | ICD-10-CM

## 2018-02-11 DIAGNOSIS — Z9689 Presence of other specified functional implants: Secondary | ICD-10-CM

## 2018-02-11 DIAGNOSIS — N183 Chronic kidney disease, stage 3 (moderate): Secondary | ICD-10-CM | POA: Diagnosis not present

## 2018-02-11 NOTE — Procedures (Signed)
DBS Programming was performed.    Total time spent programming was 10 minutes.  Device was confirmed to be on.  Soft start was confirmed to be on.  Impedences were checked and were within normal limits.  Battery was checked and was not at end of life (2.93).    Detailed analysis on separate neurophysiologic worksheet.    Final settings were as follows:  Left brain electrode:     1-2+           ; Amplitude  4.4   V   ; Pulse width 90 microseconds;   Frequency   170   Hz.  Right brain electrode:     4-7+          ; Amplitude   4.5  V ;  Pulse width 90  microseconds;  Frequency   170    Hz. (had hand pull on the left with V of 4.6)

## 2018-02-13 ENCOUNTER — Telehealth: Payer: Self-pay

## 2018-02-13 NOTE — Telephone Encounter (Signed)
appointment set for next Wednesday for the OmniPod start

## 2018-02-13 NOTE — Telephone Encounter (Signed)
Pt's wife called and stated that she received a new omnipod in the mail and is unable to "program" it. Could you walk her through this over the phone?

## 2018-02-15 ENCOUNTER — Other Ambulatory Visit: Payer: Self-pay | Admitting: Family Medicine

## 2018-02-15 NOTE — Telephone Encounter (Signed)
Electronic refill request. Colchicine Last office visit:   12/28/17 Last Filled:    30 tablet 1 01/23/2018  Please advise.

## 2018-02-17 NOTE — Telephone Encounter (Signed)
Sent. Thanks.  Needs f/u uric acid and BMET when possible.  Ordered.  Okay with me if done with labs for endo.

## 2018-02-18 DIAGNOSIS — M6281 Muscle weakness (generalized): Secondary | ICD-10-CM | POA: Diagnosis not present

## 2018-02-18 DIAGNOSIS — R262 Difficulty in walking, not elsewhere classified: Secondary | ICD-10-CM | POA: Diagnosis not present

## 2018-02-18 DIAGNOSIS — R296 Repeated falls: Secondary | ICD-10-CM | POA: Diagnosis not present

## 2018-02-18 NOTE — Telephone Encounter (Signed)
Left detailed message on voicemail.  

## 2018-02-19 DIAGNOSIS — I129 Hypertensive chronic kidney disease with stage 1 through stage 4 chronic kidney disease, or unspecified chronic kidney disease: Secondary | ICD-10-CM | POA: Diagnosis not present

## 2018-02-19 DIAGNOSIS — D631 Anemia in chronic kidney disease: Secondary | ICD-10-CM | POA: Diagnosis not present

## 2018-02-19 DIAGNOSIS — N2581 Secondary hyperparathyroidism of renal origin: Secondary | ICD-10-CM | POA: Diagnosis not present

## 2018-02-19 DIAGNOSIS — G20 Parkinson's disease: Secondary | ICD-10-CM | POA: Diagnosis not present

## 2018-02-19 DIAGNOSIS — I509 Heart failure, unspecified: Secondary | ICD-10-CM | POA: Diagnosis not present

## 2018-02-19 DIAGNOSIS — N183 Chronic kidney disease, stage 3 (moderate): Secondary | ICD-10-CM | POA: Diagnosis not present

## 2018-02-20 ENCOUNTER — Encounter: Payer: Medicare Other | Attending: Endocrinology | Admitting: Nutrition

## 2018-02-20 ENCOUNTER — Other Ambulatory Visit: Payer: Self-pay

## 2018-02-20 DIAGNOSIS — Z794 Long term (current) use of insulin: Principal | ICD-10-CM

## 2018-02-20 DIAGNOSIS — E119 Type 2 diabetes mellitus without complications: Principal | ICD-10-CM

## 2018-02-20 DIAGNOSIS — IMO0001 Reserved for inherently not codable concepts without codable children: Secondary | ICD-10-CM

## 2018-02-20 MED ORDER — GLUCOSE BLOOD VI STRP: 1.0000 | ORAL_STRIP | 3 refills | Status: DC | PRN

## 2018-02-21 ENCOUNTER — Other Ambulatory Visit (INDEPENDENT_AMBULATORY_CARE_PROVIDER_SITE_OTHER): Payer: Medicare Other

## 2018-02-21 DIAGNOSIS — E890 Postprocedural hypothyroidism: Secondary | ICD-10-CM

## 2018-02-21 DIAGNOSIS — M109 Gout, unspecified: Secondary | ICD-10-CM | POA: Diagnosis not present

## 2018-02-21 LAB — BASIC METABOLIC PANEL
BUN: 22 mg/dL (ref 6–23)
CHLORIDE: 98 meq/L (ref 96–112)
CO2: 34 mEq/L — ABNORMAL HIGH (ref 19–32)
Calcium: 9 mg/dL (ref 8.4–10.5)
Creatinine, Ser: 1.44 mg/dL (ref 0.40–1.50)
GFR: 49 mL/min — ABNORMAL LOW (ref >60.00)
Glucose, Bld: 166 mg/dL — ABNORMAL HIGH (ref 70–99)
Potassium: 3.9 mEq/L (ref 3.5–5.1)
Sodium: 138 mEq/L (ref 135–145)

## 2018-02-21 LAB — URIC ACID: Uric Acid, Serum: 7 mg/dL (ref 4.0–7.8)

## 2018-02-26 ENCOUNTER — Other Ambulatory Visit: Payer: Self-pay | Admitting: Family Medicine

## 2018-02-26 DIAGNOSIS — R262 Difficulty in walking, not elsewhere classified: Secondary | ICD-10-CM | POA: Diagnosis not present

## 2018-02-26 DIAGNOSIS — R296 Repeated falls: Secondary | ICD-10-CM | POA: Diagnosis not present

## 2018-02-26 DIAGNOSIS — M6281 Muscle weakness (generalized): Secondary | ICD-10-CM | POA: Diagnosis not present

## 2018-02-26 NOTE — Telephone Encounter (Signed)
Electronic refill request. Tramadol Last office visit:   12/28/17 Last Filled:    60 tablet 1 12/19/2017  Please advise.

## 2018-02-27 NOTE — Telephone Encounter (Signed)
Sent. Thanks.   

## 2018-03-01 ENCOUNTER — Other Ambulatory Visit: Payer: Self-pay | Admitting: *Deleted

## 2018-03-01 ENCOUNTER — Encounter: Payer: Self-pay | Admitting: Family Medicine

## 2018-03-01 MED ORDER — GABAPENTIN 100 MG PO CAPS: ORAL_CAPSULE | ORAL | 1 refills | Status: DC

## 2018-03-01 NOTE — Telephone Encounter (Signed)
MyChart message:  Henry [name] date if birth 1950-11-11 has run out of gapapentin. Pharmacy says too soon to refill. Current prescription says 1 capsule by mouth morning and 2 or 3 at night. However, after a dr visit a while back he was changed to 2 morning 4 night. He needs a new prescription called in to [facility]. Thanks.

## 2018-03-01 NOTE — Progress Notes (Signed)
Wife called saying pharmacy sent Dash by mistake, and should have sent OmniPod.  Needs help with setting up PDM and does not know how to use it.  No book/directions sent with PDM.   New PDM was set up per Dash settings: Basal rate: MN: 1.6, 7AM: 1.8,  I/C: 10, ISF: 50, Timing: 4 hours, target 120, with correction over 140. Wife and husband were shown how to use the bolus feature/turn off/on insulin, use temp basal rates.  She reported good understanding of this.  Bret notified to send her a resource guide and manual, and he agreed to overnight this to her.  They had no final questions.

## 2018-03-01 NOTE — Patient Instructions (Signed)
Read over manual when it comes tomorrow and call if questions.

## 2018-03-01 NOTE — Telephone Encounter (Signed)
Sent. Thanks.   

## 2018-03-05 DIAGNOSIS — R262 Difficulty in walking, not elsewhere classified: Secondary | ICD-10-CM | POA: Diagnosis not present

## 2018-03-05 DIAGNOSIS — R296 Repeated falls: Secondary | ICD-10-CM | POA: Diagnosis not present

## 2018-03-05 DIAGNOSIS — M6281 Muscle weakness (generalized): Secondary | ICD-10-CM | POA: Diagnosis not present

## 2018-03-06 DIAGNOSIS — R296 Repeated falls: Secondary | ICD-10-CM | POA: Diagnosis not present

## 2018-03-06 DIAGNOSIS — M6281 Muscle weakness (generalized): Secondary | ICD-10-CM | POA: Diagnosis not present

## 2018-03-06 DIAGNOSIS — R262 Difficulty in walking, not elsewhere classified: Secondary | ICD-10-CM | POA: Diagnosis not present

## 2018-03-07 DIAGNOSIS — M6281 Muscle weakness (generalized): Secondary | ICD-10-CM | POA: Diagnosis not present

## 2018-03-07 DIAGNOSIS — R262 Difficulty in walking, not elsewhere classified: Secondary | ICD-10-CM | POA: Diagnosis not present

## 2018-03-07 DIAGNOSIS — R296 Repeated falls: Secondary | ICD-10-CM | POA: Diagnosis not present

## 2018-03-08 ENCOUNTER — Other Ambulatory Visit: Payer: Self-pay | Admitting: *Deleted

## 2018-03-08 ENCOUNTER — Other Ambulatory Visit: Payer: Self-pay | Admitting: Cardiology

## 2018-03-08 DIAGNOSIS — I509 Heart failure, unspecified: Secondary | ICD-10-CM

## 2018-03-08 MED ORDER — LISINOPRIL 2.5 MG PO TABS: 2.5000 mg | ORAL_TABLET | Freq: Every day | ORAL | 3 refills | Status: DC

## 2018-03-08 NOTE — Telephone Encounter (Signed)
Please send to Dr. Elsie Stain... he is managing pt BP.

## 2018-03-10 NOTE — Telephone Encounter (Signed)
According to EMR, Dr. Lovena Le already sent this in.  I will defer.  Thanks.

## 2018-03-11 DIAGNOSIS — M6281 Muscle weakness (generalized): Secondary | ICD-10-CM | POA: Diagnosis not present

## 2018-03-11 DIAGNOSIS — R262 Difficulty in walking, not elsewhere classified: Secondary | ICD-10-CM | POA: Diagnosis not present

## 2018-03-11 DIAGNOSIS — R296 Repeated falls: Secondary | ICD-10-CM | POA: Diagnosis not present

## 2018-03-12 ENCOUNTER — Other Ambulatory Visit (INDEPENDENT_AMBULATORY_CARE_PROVIDER_SITE_OTHER): Payer: Medicare Other

## 2018-03-12 DIAGNOSIS — Z794 Long term (current) use of insulin: Secondary | ICD-10-CM | POA: Diagnosis not present

## 2018-03-12 DIAGNOSIS — E890 Postprocedural hypothyroidism: Secondary | ICD-10-CM

## 2018-03-12 DIAGNOSIS — R262 Difficulty in walking, not elsewhere classified: Secondary | ICD-10-CM | POA: Diagnosis not present

## 2018-03-12 DIAGNOSIS — R296 Repeated falls: Secondary | ICD-10-CM | POA: Diagnosis not present

## 2018-03-12 DIAGNOSIS — E1165 Type 2 diabetes mellitus with hyperglycemia: Secondary | ICD-10-CM | POA: Diagnosis not present

## 2018-03-12 DIAGNOSIS — M6281 Muscle weakness (generalized): Secondary | ICD-10-CM | POA: Diagnosis not present

## 2018-03-12 LAB — COMPREHENSIVE METABOLIC PANEL WITH GFR
ALT: 8 U/L (ref 0–53)
AST: 9 U/L (ref 0–37)
Albumin: 4.1 g/dL (ref 3.5–5.2)
Alkaline Phosphatase: 103 U/L (ref 39–117)
BUN: 30 mg/dL — ABNORMAL HIGH (ref 6–23)
CO2: 32 meq/L (ref 19–32)
Calcium: 9 mg/dL (ref 8.4–10.5)
Chloride: 96 meq/L (ref 96–112)
Creatinine, Ser: 1.63 mg/dL — ABNORMAL HIGH (ref 0.40–1.50)
GFR: 42.46 mL/min — ABNORMAL LOW
Glucose, Bld: 288 mg/dL — ABNORMAL HIGH (ref 70–99)
Potassium: 3.5 meq/L (ref 3.5–5.1)
Sodium: 136 meq/L (ref 135–145)
Total Bilirubin: 0.4 mg/dL (ref 0.2–1.2)
Total Protein: 6.5 g/dL (ref 6.0–8.3)

## 2018-03-12 LAB — LDL CHOLESTEROL, DIRECT: Direct LDL: 67 mg/dL

## 2018-03-12 LAB — LIPID PANEL
Cholesterol: 130 mg/dL (ref 0–200)
HDL: 29 mg/dL — AB (ref >39.00)
NONHDL: 101.04
Total CHOL/HDL Ratio: 4
Triglycerides: 257 mg/dL — ABNORMAL HIGH (ref 0.0–149.0)
VLDL: 51.4 mg/dL — ABNORMAL HIGH (ref 0.0–40.0)

## 2018-03-12 LAB — HEMOGLOBIN A1C: Hgb A1c MFr Bld: 7.8 % — ABNORMAL HIGH (ref 4.6–6.5)

## 2018-03-12 LAB — T4, FREE: Free T4: 0.75 ng/dL (ref 0.60–1.60)

## 2018-03-12 LAB — TSH: TSH: 3.51 u[IU]/mL (ref 0.35–4.50)

## 2018-03-13 ENCOUNTER — Other Ambulatory Visit: Payer: Self-pay | Admitting: Family Medicine

## 2018-03-13 DIAGNOSIS — M6281 Muscle weakness (generalized): Secondary | ICD-10-CM | POA: Diagnosis not present

## 2018-03-13 DIAGNOSIS — R262 Difficulty in walking, not elsewhere classified: Secondary | ICD-10-CM | POA: Diagnosis not present

## 2018-03-13 DIAGNOSIS — R296 Repeated falls: Secondary | ICD-10-CM | POA: Diagnosis not present

## 2018-03-14 DIAGNOSIS — M6281 Muscle weakness (generalized): Secondary | ICD-10-CM | POA: Diagnosis not present

## 2018-03-14 DIAGNOSIS — R296 Repeated falls: Secondary | ICD-10-CM | POA: Diagnosis not present

## 2018-03-14 DIAGNOSIS — R262 Difficulty in walking, not elsewhere classified: Secondary | ICD-10-CM | POA: Diagnosis not present

## 2018-03-15 ENCOUNTER — Encounter: Payer: Self-pay | Admitting: Endocrinology

## 2018-03-15 ENCOUNTER — Telehealth: Payer: Self-pay | Admitting: Neurology

## 2018-03-15 ENCOUNTER — Ambulatory Visit (INDEPENDENT_AMBULATORY_CARE_PROVIDER_SITE_OTHER): Payer: Medicare Other | Admitting: Endocrinology

## 2018-03-15 ENCOUNTER — Other Ambulatory Visit: Payer: Self-pay

## 2018-03-15 VITALS — BP 110/60 | HR 75 | Ht 69.0 in | Wt 258.0 lb

## 2018-03-15 DIAGNOSIS — E890 Postprocedural hypothyroidism: Secondary | ICD-10-CM | POA: Diagnosis not present

## 2018-03-15 DIAGNOSIS — I255 Ischemic cardiomyopathy: Secondary | ICD-10-CM | POA: Diagnosis not present

## 2018-03-15 DIAGNOSIS — E1165 Type 2 diabetes mellitus with hyperglycemia: Secondary | ICD-10-CM | POA: Diagnosis not present

## 2018-03-15 DIAGNOSIS — Z794 Long term (current) use of insulin: Secondary | ICD-10-CM | POA: Diagnosis not present

## 2018-03-15 NOTE — Progress Notes (Signed)
Patient ID: Henry [name], male   DOB: 01/06/1951, 68 y.o.   MRN: [id number]           Reason for Appointment: Follow-up for Type 2 Diabetes   History of Present Illness:          Diagnosis: Type 2 diabetes mellitus, date of diagnosis: 2000      Past history:   Patient thinks he has been taking Amaryl for several years and probably metformin since onset also At some point he was changed from metformin to [facility] and was taking this since at least 2012 A1c had been higher in 2015 Since his A1c had been progressively higher with his regimen of Janumet and Amaryl he was started on Victoza in 01/2014 He was started on Lantus insulin in 5/16 because of persistent hyperglycemia especially fasting; was having readings as high as 293 Because of tendency to high postprandial readings and for control with Lantus he was switched to the V-go pump in  09/2014  Recent history:    INSULIN regimen: Omnipod insulin pump Basal rate programs: Midnight = 1.6 and 7 AM = 1.8 Boluses 4 units for breakfast and lunch and 8 units for dinner  Non-insulin hypoglycemic drugs the patient is taking are:  Victoza 1.8 mg daily in am   His A1c most recently 7.8, previously 7.2  Current management, blood sugar patterns and problems identified:  His blood sugars are not as high as expected from his A1c  Most likely is getting higher postprandial readings and inconsistent control, usually not checking readings after evening meal  He does not always eat lunch but not clear if he is missing his boluses midday for his meals sometimes  Occasionally blood sugars are over 200 both fasting and before dinner also  His wife thinks that he is not snacking too much during the night and may be have a bedtime snack  His wife is trying to help him with his diet and last night had a salad and grilled chicken  However his weight is up 4 pounds since his last visit  He is concerned about his pump not lasting the full 3  days and needing to replace the pod and the alarms that he does not like  However he is still able to manage very well with his wife's help  Hypoglycemia:   none  He has seen the dietitian in 3/16  Not able to exercise because of back pain and difficulty walking       Side effects from medications have been: None Compliance with the medical regimen: Fair   Glucose monitoring:  done 4-5 times  a day         Glucometer:  Freestyle      Blood Glucose readings by download of pump below           PRE-MEAL Fasting Lunch Dinner Bedtime Overall  Glucose range:  101-205  80, 169  117-234    Mean/median:  145   155   155   POST-MEAL PC Breakfast PC Lunch PC Dinner  Glucose range:    158-194  Mean/median:      PREVIOUS readings:  PRE-MEAL Fasting Lunch Dinner Bedtime Overall  Glucose range:  115-200  158, 184  189, 290    Mean/median:  144     164    Meals: 9 am , 2-3 pm and 6-7 pm            Dietician visit, most recent: 4/19  Weight history:   Wt Readings from Last 3 Encounters:  03/15/18 258 lb (117 kg)  02/11/18 256 lb 8 oz (116.3 kg)  01/10/18 254 lb 12.8 oz (115.6 kg)    Glycemic control:   Lab Results  Component Value Date   HGBA1C 7.8 (H) 03/12/2018   HGBA1C 7.2 (H) 11/08/2017   HGBA1C 8.4 (A) 09/19/2017   Lab Results  Component Value Date   MICROALBUR <0.7 02/05/2017   LDLCALC 118 (H) 09/16/2015   CREATININE 1.63 (H) 03/12/2018    Lab Results  Component Value Date   FRUCTOSAMINE 277 11/09/2017   FRUCTOSAMINE 350 (H) 03/29/2017   FRUCTOSAMINE 250 09/16/2015     Other active problems: See review of systems      Allergies as of 03/15/2018      Reactions   Penicillins Rash, Other (See Comments)   WEARS ALLERGY BRACELET Because of a history of documented adverse serious drug reaction;Medi Alert bracelet  is recommended PATIENT HAS HAD A PCN REACTION WITH IMMEDIATE RASH, FACIAL/TONGUE/THROAT SWELLING, SOB, OR LIGHTHEADEDNESS WITH  HYPOTENSION:  #  #  YES  #  #  Has patient had a PCN reaction causing severe rash involving mucus membranes or skin necrosis: unknown Has patient had a PCN reaction that required hospitalization NO Has patient had a PCN reaction occurring within the last 10 years: NO   Klonopin [clonazepam] Other (See Comments)   agitation   Peanut-containing Drug Products Cough   Watermelon [citrullus Vulgaris] Other (See Comments), Cough   Tickle in throat      Medication List       Accurate as of March 15, 2018  2:31 PM. Always use your most recent med list.        acetaminophen 500 MG tablet Commonly known as:  TYLENOL Take 500 mg by mouth 2 (two) times daily as needed for moderate pain or headache.   albuterol 108 (90 Base) MCG/ACT inhaler Commonly known as:  PROVENTIL HFA;VENTOLIN HFA Inhale 2 puffs into the lungs every 6 (six) hours as needed for wheezing or shortness of breath.   allopurinol 100 MG tablet Commonly known as:  ZYLOPRIM Take 1.5 tablets (150 mg total) by mouth daily.   AMBULATORY NON FORMULARY MEDICATION Ustep walker DX: G20   aspirin EC 81 MG tablet Take 81 mg by mouth daily.   Carbidopa-Levodopa ER 25-100 MG tablet controlled release Commonly known as:  SINEMET CR Take 1-2 tablets by mouth See admin instructions. Take 2 tablets by mouth at 0700, take 2 tablets by mouth at 1200, take 1 tablet by mouth at 1600 and take 1 tablet by mouth at bedtime   cholecalciferol 1000 units tablet Commonly known as:  VITAMIN D Take 1,000 Units by mouth daily.   colchicine 0.6 MG tablet TAKE 1 TABLET BY MOUTH TWICE A DAY AS NEEDED FOR GOUT   ferrous sulfate 325 (65 FE) MG EC tablet Take 325 mg by mouth 2 (two) times daily.   furosemide 80 MG tablet Commonly known as:  LASIX Take 1.5 tablets (120 mg total) by mouth daily.   gabapentin 100 MG capsule Commonly known as:  NEURONTIN Take 200 mg by mouth in the morning and take 400 mg by mouth at bedtime.  6 per day.     glucose blood test strip Commonly known as:  OneTouch Verio USE AS DIRECTED TO CHECK BLOOD SUGAR TWICE A DAY. DX:E11.65   glucose blood test strip 1 each by Other route as needed for other. Use as  instructed to check blood sugar 4 times daily.   insulin lispro 100 UNIT/ML injection Commonly known as:  HUMALOG Uses via insulin pump OmniPod Dash.   levothyroxine 125 MCG tablet Commonly known as:  SYNTHROID, LEVOTHROID TAKE 1 TABLET EVERY DAY DAILY BEFORE BREAKFAST   liraglutide 18 MG/3ML Sopn Commonly known as:  Victoza Inject 0.3 mLs (1.8 mg total) into the skin daily.   lisinopril 2.5 MG tablet Commonly known as:  PRINIVIL,ZESTRIL Take 1 tablet (2.5 mg total) by mouth daily.   metolazone 2.5 MG tablet Commonly known as:  ZAROXOLYN TAKE 1 TABLET BY MOUTH EVERY DAY AS NEEDED FOR > 3LB IN A DAY OR > 5LB IN A WEEK   metoprolol succinate 50 MG 24 hr tablet Commonly known as:  TOPROL-XL TAKE 1 TABLET BY MOUTH DAILY. TAKE WITH OR IMMEDIATELY FOLLOWING A MEAL.   multivitamin with minerals Tabs tablet Take 1 tablet by mouth daily.   Myrbetriq 50 MG Tb24 tablet Generic drug:  mirabegron ER Take 50 mg by mouth daily.   nitroGLYCERIN 0.4 MG SL tablet Commonly known as:  NITROSTAT Place 1 tablet (0.4 mg total) under the tongue every 5 (five) minutes as needed for chest pain.   OmniPod Dash System Kit 1 each by Does not apply route. Use Dash system for Continuous Blood Glucose Monitoring.   OVER THE COUNTER MEDICATION Apply 1 application topically at bedtime. Theraworx Pain Cream   polyethylene glycol packet Commonly known as:  MIRALAX / GLYCOLAX Take 17 g by mouth daily as needed for moderate constipation.   potassium chloride SA 20 MEQ tablet Commonly known as:  Klor-Con M20 Take 1 tablet (20 mEq total) by mouth 2 (two) times daily.   rosuvastatin 20 MG tablet Commonly known as:  CRESTOR TAKE 1 TABLET BY MOUTH EVERY DAY   traMADol 50 MG tablet Commonly known as:   ULTRAM TAKE 1 TABLET (50 MG TOTAL) BY MOUTH 2 TIMES DAILY AS NEEDED FOR MODERATE PAIN.   travoprost (benzalkonium) 0.004 % ophthalmic solution Commonly known as:  TRAVATAN Place 1 drop into the right eye at bedtime.   vitamin B-12 1000 MCG tablet Commonly known as:  CYANOCOBALAMIN Take 1,000 mcg by mouth daily.   Wixela Inhub 250-50 MCG/DOSE Aepb Generic drug:  Fluticasone-Salmeterol INHALE 1 PUFF INTO THE LUNGS 2 TIMES DAILY.       Allergies:  Allergies  Allergen Reactions  . Penicillins Rash and Other (See Comments)    WEARS ALLERGY BRACELET Because of a history of documented adverse serious drug reaction;Medi Alert bracelet  is recommended PATIENT HAS HAD A PCN REACTION WITH IMMEDIATE RASH, FACIAL/TONGUE/THROAT SWELLING, SOB, OR LIGHTHEADEDNESS WITH HYPOTENSION:  #  #  YES  #  #  Has patient had a PCN reaction causing severe rash involving mucus membranes or skin necrosis: unknown Has patient had a PCN reaction that required hospitalization NO Has patient had a PCN reaction occurring within the last 10 years: NO  . Klonopin [Clonazepam] Other (See Comments)    agitation  . Peanut-Containing Drug Products Cough  . Watermelon [Citrullus Vulgaris] Other (See Comments) and Cough    Tickle in throat    Past Medical History:  Diagnosis Date  . AICD (automatic cardioverter/defibrillator) present    Dr Lovena Le office visit yearly, MDT   . Arthritis    cane  . Asthma   . Benign neoplasm of colon   . CAD (coronary artery disease)   . Cardiomyopathy   . Cataract   . Complication of  anesthesia    pt states that he got a rash  . Constipation   . 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 DM (diabetes mellitus) ([place])    TYPE 2   . GERD (gastroesophageal reflux disease)   . Glaucoma    right eye  . HLD (hyperlipidemia)   . HTN (hypertension)    pt denies 08/19/12  . Hyperplasia, prostate   . Hyperthyroidism    thyroid lobectomy  . MI (myocardial  infarction) [facility])    Dr Stanford Breed 2000, x3vessels bypass  . Nephrolithiasis   . OSA (obstructive sleep apnea)    AHI-28,on CPAP, noncompliant with CPAP  . Parkinson disease ([place])    1999  . PONV (postoperative nausea and vomiting)   . Restless legs   . Shortness of breath    Hx: of at all times  . Sleep apnea    does not use CPAP  . Sleep apnea, organic   . UTI (lower urinary tract infection) 09/15/12   Klebsiella  . Ventral hernia     Past Surgical History:  Procedure Laterality Date  . 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 CARDIAC CATHETERIZATION    . CATARACT EXTRACTION W/ INTRAOCULAR LENS IMPLANT     Hx: of right eye  . CATARACT EXTRACTION W/ INTRAOCULAR LENS IMPLANT Left 2018  . COLONOSCOPY N/A 10/13/2014   Procedure: COLONOSCOPY;  Surgeon: Jerene Bears, MD;  Location: [facility] ENDOSCOPY;  Service: Gastroenterology;  Laterality: N/A;  . COLONOSCOPY W/ BIOPSIES AND POLYPECTOMY     Hx: of  . CORONARY ARTERY BYPASS GRAFT  2000   [name], MD  . [place]  . DEEP BRAIN STIMULATOR PLACEMENT  2004   Right and left VIN stimulator placement (parkinsons)  . EYE SURGERY    . FINGER AMPUTATION     left pointer  . IMPLANTABLE CARDIOVERTER DEFIBRILLATOR IMPLANT N/A 11/13/2013   Procedure: IMPLANTABLE CARDIOVERTER DEFIBRILLATOR IMPLANT;  Surgeon: Evans Lance, MD;  Location: [facility] CATH LAB;  Service: Cardiovascular;  Laterality: N/A;  . INSERT / REPLACE / REMOVE PACEMAKER    . LEFT AND RIGHT HEART CATHETERIZATION WITH CORONARY ANGIOGRAM N/A 09/24/2013   Procedure: LEFT AND RIGHT HEART CATHETERIZATION WITH CORONARY ANGIOGRAM;  Surgeon: Burnell Blanks, MD;  Location: [facility] CATH LAB;  Service: Cardiovascular;  Laterality: N/A;  . LITHOTRIPSY     3 different times  . MEDIAN STERNOTOMY  2000  . PULSE GENERATOR IMPLANT Right 11/13/2017   Procedure: Right chest implantable pulse generator change;  Surgeon: Erline Levine, MD;  Location: [place];   Service: Neurosurgery;  Laterality: Right;  Right chest implantable pulse generator change  . SUBTHALAMIC STIMULATOR BATTERY REPLACEMENT N/A 09/05/2012   Procedure: Deep brain stimulator battery change;  Surgeon: Erline Levine, MD;  Location: [place] [facility];  Service: Neurosurgery;  Laterality: N/A;  Deep brain stimulator battery change  . SUBTHALAMIC STIMULATOR BATTERY REPLACEMENT N/A 06/10/2015   Procedure: Deep Brain stimulator battery change;  Surgeon: Erline Levine, MD;  Location: [place] [facility]UR[facility];  Service: Neurosurgery;  Laterality: N/A;  . TONSILLECTOMY      Family History  Problem Relation Age of Onset  . Aneurysm Mother   . Alcoholism Father   . HIV/AIDS Brother 71       AIDS  . Healthy Child   . Peripheral vascular disease Other   . Arthritis Other   . Healthy Child   . Healthy Child   .  Diabetes Neg Hx   . Heart disease Neg Hx   . Colon cancer Neg Hx   . Prostate cancer Neg Hx     Social History:  reports that he has never smoked. He has never used smokeless tobacco. He reports current alcohol use. He reports that he does not use drugs.    Review of Systems     Most recent eye exam was done in 04/2017   HYPERLIPIDEMIA:  He is on Crestor 20 mg with good control of LDL, has low HDL Triglycerides are persistently over 200 even with Lovaza 2 g twice a day       Lab Results  Component Value Date   CHOL 130 03/12/2018   CHOL 123 04/04/2017   CHOL 111 10/16/2016   Lab Results  Component Value Date   HDL 29.00 (L) 03/12/2018   HDL 32.30 (L) 04/04/2017   HDL 27.80 (L) 10/16/2016   Lab Results  Component Value Date   LDLCALC 118 (H) 09/16/2015   LDLCALC 126 (H) 04/08/2013   LDLCALC 51 02/28/2012   Lab Results  Component Value Date   TRIG 257.0 (H) 03/12/2018   TRIG 232.0 (H) 04/04/2017   TRIG 287.0 (H) 10/16/2016   Lab Results  Component Value Date   CHOLHDL 4 03/12/2018   CHOLHDL 4 04/04/2017   CHOLHDL 4 10/16/2016   Lab Results  Component Value Date    LDLDIRECT 67.0 03/12/2018   LDLDIRECT 61.0 04/04/2017   LDLDIRECT 52.0 10/16/2016                  Thyroid:   He has had hypothyroidism following radioactive iodine treatment for hyperthyroidism in 2013  TSH is  normal lately on 125ug   Lab Results  Component Value Date   TSH 3.51 03/12/2018   TSH 5.68 (H) 11/09/2017   TSH 4.57 (H) 05/17/2017   FREET4 0.75 03/12/2018   FREET4 0.72 11/09/2017   FREET4 0.74 02/05/2017       HYPERTENSION has been followed by  other physicians   CKD: Has variable creatinine levels, usually high   Lab Results  Component Value Date   CREATININE 1.63 (H) 03/12/2018   CREATININE 1.44 02/21/2018   CREATININE 1.61 (H) 11/08/2017    Neuropathy: Taking gabapentin as needed  Diabetic foot exam in 04/2017 showed  normal monofilament sensation in the toes except decreased on the right great toe and plantar surfaces, no skin lesions or ulcers on the feet and normal pedal pulses    Physical Examination:  BP 110/60 (BP Location: Left Arm, Patient Position: Sitting, Cuff Size: Normal)   Pulse 75   Ht '5\' 9"'$  (1.753 m)   Wt 258 lb (117 kg)   SpO2 95%   BMI 38.10 kg/m      ASSESSMENT/PLAN  Diabetes type 2, insulin-dependent with obesity     See history of present illness for detailed discussion of his current management, blood sugar patterns and problems identified  His A1c in late October was 7.2 and now 7.8  Currently using the Omni pod insulin pump with the older PDM  As discussed above he is not having consistently blood sugars but overall still doing significantly better than previously on the V-go pump and multiple injections Blood sugar monitoring can be better especially after meals Also not clear if he is sometimes missing boluses at lunch Also on Victoza with probably some benefit  Recommended that he increase the basal rate at 6 PM up to 2.0 and this  may help with most of his high readings in the evenings However may consider  increasing his boluses if postprandial readings are more consistently high He will try to change his pods consistently at 72 hours Discussed day-to-day management of his monitoring, blood sugar targets, bolusing, need for weight loss and glucose monitoring  Hypothyroidism: Needs to continue 125 mcg  There are no Patient Instructions on file for this visit.   Counseling time on subjects discussed in assessment and plan sections is over 50% of today's 25 minute visit     Elayne Snare 03/15/2018, 2:31 PM   Note: This office note was prepared with Dragon voice recognition system technology. Any transcriptional errors that result from this process are unintentional.

## 2018-03-15 NOTE — Telephone Encounter (Signed)
I didn't change much about his device last time so doesn't make too much sense that its the device (esp if bilaterally).  When did it start?  Can wife see it?  Does it hurt?

## 2018-03-15 NOTE — Telephone Encounter (Signed)
Patient came into office about the facial pulling on both sides and that he was experiencing it during the day and worse at night. You spoke with patient. Please advise. Thanks!

## 2018-03-15 NOTE — Telephone Encounter (Signed)
Spoke with patient who came into the office, instructed to come here from Dr. Dwyane Dee. He states a 2 month history of facial pulling. Same on both sides of face. Mainly in cheeks, but some on the top of the right side of face. Worse at night when he is laying down. Please advise. He made a follow up appt for August and is on the wait list.

## 2018-03-15 NOTE — Telephone Encounter (Signed)
Episodes have been going on for two months. No pain involved. Wife can not see the pulling.

## 2018-03-18 DIAGNOSIS — R296 Repeated falls: Secondary | ICD-10-CM | POA: Diagnosis not present

## 2018-03-18 DIAGNOSIS — M6281 Muscle weakness (generalized): Secondary | ICD-10-CM | POA: Diagnosis not present

## 2018-03-18 DIAGNOSIS — R262 Difficulty in walking, not elsewhere classified: Secondary | ICD-10-CM | POA: Diagnosis not present

## 2018-03-19 DIAGNOSIS — R262 Difficulty in walking, not elsewhere classified: Secondary | ICD-10-CM | POA: Diagnosis not present

## 2018-03-19 DIAGNOSIS — M6281 Muscle weakness (generalized): Secondary | ICD-10-CM | POA: Diagnosis not present

## 2018-03-19 DIAGNOSIS — R296 Repeated falls: Secondary | ICD-10-CM | POA: Diagnosis not present

## 2018-03-19 NOTE — Progress Notes (Signed)
Henry [name] was seen today in the movement disorders clinic for neurologic consultation at the request of [name].  His PCP is Tonia Ghent, MD.  The consultation is for the evaluation of PD and to manage his DBS.  He is accompanied by his wife who supplements the history.  The first symptom(s) the patient noticed was right hand tremor in 1999.  He was seen by neurology and was dx with PD.  He was placed on something, but it caused sleepiness.  He does not think that he has ever been on levodopa.   He was placed on Mirapex, which seemed to help.  He only takes it twice per day.  He didn't think it made a difference when he took it tid.  He began to have tremor and it was suggested he do DBS.    The pt is s/p stn DBS in 2005.  He had a battery change in 2009.    10/02/12 update: The patient is accompanied today by his daughter, who supplements the history.  I reviewed medical records available to me since last visit.  The patient had his generator changed on 09/05/2012.  He remains on pramipexole, 0.5 mg twice per day.  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 [name] in that regard.  He is not compliant with CPAP so says that he doesn't want to return to her for f/u. He doesn't take the klonopin faithfully.  10/11/12 update:  Pt was seen as a walk in/work in today.  Pt had increasing tremor, L greater than R for 3 days.  Thinks that it started with the d/c of artane.  Speech stable.  11/19/12 update:  Pt is seen today for his PD, accompanied by his daughter who supplements the hx.    He is currently on klonopin 0.5 mg - 1/2-1 tablet q hs.  He only takes it when his wife is not working.  She works nights and is only in 2 days per week.  He has some reluctance to take it other nights.  He is on pramipexole 0.5 mg bid.  Last visit, his DBS was reset more similar to the settings he had prior to coming here, just with an increased voltage.  About 2 weeks after our last  visit, the patient decided to go back on the Artane.  The combination of the Artane and the DBS changes helped significantly.  He does ask if I can slightly increased voltage on the left hand, as he has some tremor at night that is bothersome.  Otherwise, he is doing well.  He feels that his balance has been great.  02/19/13 update:  This patient is accompanied in the office by his child who supplements the history.  The pt has a hx of PD.  He has been tremor free and is very happy about that.  He has had some increased balance loss; the last fall was a few months ago but he hasn't gotten hurt.  Was considering hernia surgery.  The records that were made available to me were reviewed.  He is holding on that for now but plans to have it done before end of year.  No hallucinations.  He is still having some acting out of the dreams, despite clonazepam.  06/19/13 update:  Patient is returning to followup regarding his Parkinson's disease.  I had the opportunity to review records since last visit.  He went to the emergency room on  05/12/2013 with chest pain and shortness of breath.  He had been fishing and had missed several doses of Lasix.  He had been eating seafood.  He ended up with an acute exacerbation of his chronic congestive heart failure.  Once he was diuresed and back on his medications, he is feeling better.  He just had a nuclear medicine study done on 06/17/2013 and the ejection fraction on this looked better than on his echocardiogram.  The left ventricular ejection fraction was 41%.  Prior to that, there was some concern that his left ventricular ejection fraction had dropped so much that he may need an ICD.  In terms of Parkinson's disease, the patient states that he has been doing very well in terms of tremor, but his walking really has deteriorated.  He describes a festinating gait, with much more shuffling.  He fell walking over his dog gate but otherwise has not had falls.  He went fishing  yesterday and states that he "stumbled all day long."  No hallucinations.  He has been trying to be more faithful with his CPAP. He remains on Mirapex 0.5 mg twice a day, Artane 2 mg twice a day.  10/23/13 update:  The patient returns today to the clinic, accompanied by his wife who supplements the history.  From a Parkinson's standpoint, the patient states that he has been doing very well.  No tremor.  He started on levodopa last visit and states that he has had no falls and overall stumbling has been much better.  He states that the only time that he stumbles is if he is inside of his fishing boat, and states that that is really because it is small and unstable.  He is taking his levodopa in the morning, after lunch and bedtime.  He has had no hallucinations.  His wife states that he is still draining and acting out the dreams and even fell out of bed a few days ago.  However, he is taking his clonazepam about 10 PM but doesn't go to bed until 1 AM.  He is not using his CPAP.  Is having significant constipation.   I reviewed his cardiology records since last visit.  He did have a heart catheterization and is scheduled to have an ICD placed on November 5.  02/23/14 update:  Pt returns for follow up accompanied by his wife who supplements the history.  The records that were made available to me were reviewed since last visit.  He had an ICD placed on 11/15/13.  He is doing well.  Taking carbidopa/levodopa 25/100 three times a day and remains on artane as well as mirapex 0.5 mg bid.  Had a fall up the stairs.  Wife describes festinating gate and pt thinks that levodopa contributes.  He also doesn't feel good much of the time and thinks that is from levodopa.  Admits, however, that when BS under good control, he feels good.  Has started seeing endocrinology and started on new meds.  He has klonopin for RBD.  Still vivid dreams but no falling out bed (in recliner now).  Having some word finding trouble and memory  doesn't seem clear.  Also, put back tailgate down on car and came down on generator and wants me to look at that.    07/01/14 update:  The patient follows up today regarding his Parkinson's disease.  He is accompanied by his wife who supplements the history.  Last visit, the patient was complaining about memory change which  I thought was likely multifactorial and due to medication such as Artane, Mirapex and Ultram, which he was taking every 3 hours.  The patient, however, thought that it was from levodopa and so we held it and the patient reported that he felt better and therefore continued to stay off of the medication.  He reports that falls are better after d/c levodopa but wife reports still falls.  Pt states that he only fell in shower after he d/c levodopa.  His wife does admit that she thinks that levodopa was causing loss of balance.  He is having more tremor over the last month on the L hand.   He remains on clonazepam 0.5 mg, 1-1/2 tablets at night for REM behavior disorder.  I reviewed records since last visit.  He has seen Dr. Dwyane Dee multiple times in regards to his uncontrolled diabetes.  He started on insulin on May 22 2014.  He states that he is doing well with injecting himself.  Having a lot of constipation.  Has the rancho recipe.    11/03/14 update:  The patient is following up today, accompanied by his wife who supplements the history.  Records were reviewed since our last visit.  The patient is on pramipexole 0.5 mg twice a day and Artane 2 mg twice a day.  He has not had any hallucinations.  Had a fall the other day walking up stairs carrying something and fell backward.  Hit his back.  No LOC.  Also fell backward in the summer around the pool.  [place] one time out of his boat.  Doesn't want to use a walker. He was in the emergency room recently with a rash that was felt secondary to insect bites that he got in the woods.  He also recently had a colonoscopy.  His diabetes is under better  control and his last A1c was 7.2.  States that he now has an insulin pump.  He remains on clonazepam 0.5 mg, 1-1/2 tablets at night for REM behavior disorder.  More back pain lately and will start injections for the back which have helped previously  02/04/15 update:  The patient presents today, accompanied by his wife who supplements the history.  I have reviewed prior records made available to me.  He has established a new primary care doctor in Dr. Damita Dunnings.  The patient remains on Artane, 2 mg twice a day as well as pramipexole 0.5 mg twice a day.  Overall, he has been doing fairly well.  He occasionally still has some tremor.  He had 2 falls since our last visit; he states that he was pushing a gate open and it came back and hit him.  He was also carrying wood the other day and fell forward with wood.  No hallucinations.  No lightheadedness or near syncope.  He remains on clonazepam 0.5 mg, 1-1/2 tablets at night for REM behavior disorder.  [facility] is [facility] at [facility].    06/08/15 update:  This patient is accompanied in the office by his spouse who supplements the history.  The patient remains on Artane, 2 mg twice a day as well as pramipexole 0.5 mg twice a day.  He tripped over the curb at [employer].  He tripped over the step on his deck.   He remains on clonazepam 0.5 mg, 1-1/2 tablets at night for REM behavior disorder.  He is still having trouble with that.  He fell out of bed the other night.  Wife asks about bed rails.   He felt that [facility] helped.  Is looking forward to getting in his pool.    09/09/15 update:  The patient follows up today, accompanied by his wife who supplements the history.  He is on pramipexole 0.5 g twice a day and trihexyphenidyl 2 mg twice a day.  Didn't go up to the 1.5 mg of pramipexole.   He is on clonazepam 0.5 mg, 1-1/2 tablets at night for REM behavior disorder.  His biggest issue is RLS at bedtime.  It is preventing him from going to sleep.    He denies any hallucinations.  He denies cognitive change.  He continues to struggle with balance and has fallen/stumbled a few times.  He has fallen in the boat.  Wife describes festinating gait.  Wife also describes crying frequently.  Denies depression. He has his battery changed on 06/10/15  12/18/15 update:  Pt is following up today, accompanied by his wife who supplements the history.  Last visit, I changed him from pramipexole, 0.5 mg twice per day to Mirapex ER, 1.5 mg once per day.  He states that it is very expensive.  He remains on trihexyphenidyl, 2 mg twice per day.  He is also on clonazepam 0.5 mg, 1-1/2 tablets at night for REM behavior disorder. It turns out he was taking this at 6pm.   I reviewed records since last visit.  He reports that his pharmacist to come off his Aldactone because of an interaction with his Parkinson's medicine.  I am unaware of any interaction with his current medications.  Been having issues with constipation, which has been treated by Dr. Damita Dunnings.  [facility] is in [facility].  When asked about falls, his wife states that he falls all the time but he is more conscious of festinating gait now that he is in PT.  Not gotten hurt with falls.    07/13/16 update:  Pt f/u today for PD, accompanied by his wife, who supplements the history.  The records that were made available to me were reviewed since last visit.  On pramipexole ER 1.5 mg daily and artane 2 mg bid.  Was on klonopin for RBD but he d/c the medication.  He thought that it caused insomnia.  He is still acting out the dreams.  He won't use his CPAP machine.  States that he is afraid of it malfunctioning.  Often will sleep in the recliner.  Having more back pain.  Having injections and 3rd is next week at [facility].    Wearing off:  No.  How long before next dose:  n/a Falls:   Yes.  , 12 months but some of these were falls out of bed in RBD N/V:  No. Hallucinations:  Yes.   (none visual but few auditory and has  gotten out of bed to see who was there)  visual distortions: No. Lightheaded:  No.  Syncope: No. Dyskinesia:  No.   12/15/16 update: Patient seen today in follow-up for his Parkinson's disease.  He is accompanied by his wife who supplements the history.  He remains on pramipexole ER, 1.5 mg daily.  He is also on trihexyphenidyl, 2 mg.  He was taking bid but he looked at his bottle and realized he was supposed to be taking it qd and just started doing that.  He has not had any hallucinations, except a few arising in the middle of the night out of a vivid dream.  No compulsive  behaviors.  I have reviewed records made available to me from his other providers, including his endocrinologist, cardiologist and primary care physician.  Off of klonopin.  "If I take it I sleep the entire next day."  He is having vivid dreams.  States that he has not fallen that much until last night.  Wife describes festinating gait.  Having more trouble in the shower.  A shower chair will not fit in his shower.  They have a small corner seat, but he states that he cannot wash his feet if he sits in it.  It is too slippery.  He is a currently attending [facility] through [facility] for his low back pain.  He does like that.  04/20/17 update: Patient is seen today in follow-up for his Parkinson's disease.  Patient is accompanied by his wife who supplements the history.  Patient is on pramipexole, 1.5 mg daily.  He is also on Artane, 2 mg daily.  No hallucinations.  No lightheadedness or near syncope.  I have talked to his primary care physician about him.  Primary care wanted to get an MRI of his brain due to hearing loss. 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  Discussed with his primary care physician that if he needed MRI, his device is MRI compatible for head MRI, but would need [employer] rep there to turn off the device and make sure  impedances were normal the day of MRI. Has new hearing aid as of today and doing well with that.   [place] today and tried to get off of toilet and hit door knob and fell.  Still going fishing and friend helps him navigate the water.  Went yesterday. Having more renal dysfunction and urinary urgency and dysuria.  No UTI per urology and placed on myrbetriq.  Has referral to nephrology but has not heard from them yet.  06/29/17 update: Patient is seen today in follow-up for Parkinson's.  We tried to discontinue his trihexyphenidyl, but they felt that his walking got worse after discontinuing the medication.  It was restarted, but I told them to trial off of it again as trihexyphenidyl generally has a negative effect on walking and not positive.  He ended up staying on the medication.  Overall, they do think that balance has been worse as has been acting out of the dreams.  He has had 2-3 falls.  He is having freezing.  Battery site sore since fall.   He describes hallucinations but they arise out of the sleep.  He will start dreaming and it feels real.  He states that it goes away when wakes up.  Wife isn't so sure.  He is tried clonazepam but that it made him too sleepy.  wife states that she restarted klonopin a week ago, 0.5 mg, and wife thinks that it has helped sleep but still having a bit of that.  He is still on pramipexole ER, 1.5 mg daily.    07/23/17 update: Patient is seen today in follow-up for Parkinson's.  His device was adjusted last visit and I also had him start back on carbidopa/levodopa 25/100 and work to 1 tablet 3 times per day.  His pramipexole was decreased to 0.25 mg tid.  Today, he reports that he is feeling more stable with this change in meds.  He is back on klonopin and sleeping well.  He  fell in the kitchen.  He was near the stove and went to reach for the refrigerator and fell and glasses cut his face.  He had his walker near him but wasn't using it because "I didn't need it."     He is  having trouble remembering middle of the day dosing but generally takes med at 8am/2pm/8pm (didn't take 2 pm today and seen at 3pm)  10/25/17 update: Patient is seen today in follow-up for Parkinson's disease, accompanied by his wife who supplements history.  He is supposed to be on carbidopa/levodopa 25/100, 2/2/1 but is still only on carbidopa/levodopa 25/100 tid.  He reports that he started on the correct dosage initially but had old bottle and was running out early. Wife states that he thinks she is trying to harm him when given proper dose since old directions were on the bottle.   He then tells me "it makes me too dizzy."    He is off of pramipexole and artane.  Multiple falls but falling always without the walker.  Has hit head hard with fall onto oak cabinet and worries because tremor worse after that.   More cramping in the feet at night.    02/11/18 update: Patient is seen today in follow-up for Parkinson's disease, accompanied by his wife who supplements the history.  I discontinued his immediate release levodopa last visit because of dizziness.  I started him on carbidopa/levodopa 25/100 CR and told him to take 2 tablets at 7 AM, 2 tablets at noon, 1 tablet at 4 PM and 1 tablet at bedtime, in hopes that it would help the nighttime cramping.  He states that it helped but he also got some shot in his foot that has helped.  He rubs some cream on it that he thinks helps.  Last visit, I encouraged him to get a U step walker given the falls.  He is using a walker some at home but not always.   He still falls.  He actually pulled his kitchen cabinet out of the wall due to fall.  He is exercising at home.  He is walking straight ahead better now that he is exercising but has trouble in the turns.   His DBS battery was changed on November 13, 2017.  He thinks that he is shaking more since the battery change - L leg and R arm.   Records have been reviewed.  He has seen his primary care physician about shoulder  pain.  He has been following with Dr. Dwyane Dee regarding his diabetes.  tells me about facial paresthesias - "I feel like i've been having on my glasses when I don't."   Involves the face/cheek/forehead on the right.  First says it started a year ago but then wife states that it was a few months ago.  Had a CT after our last visit and isn't sure if had the sx then.    03/20/18 update: Patient seen today as a work in appointment at the request of his endocrinologist.  He was seen there on March 6 and apparently was complaining about pulling in his face.  Patient reports that it has been going on for months.   There is no pain.  There is no visible pulling of the face.  It seems to be worse at night when he lays down.  When he came in last visit, he was complaining about some facial paresthesias on the right and today states that really this is the same thing  that he is talking about when he described pulling to Dr. Dwyane Dee.  It is mainly on the right side but is some on the L.   He states today it is in the R temple to the R jaw.  It seems to come and go but worse over the last month.  Feels a bit like it is "numb but not numb."   Admits that he has fallen and hit his head since our last visit.   He is on carbidopa/levodopa 25/100 CR, 2 tablets at 7 AM, 2 tablets at noon, 1 tablet at 4 PM and 1 tablet at bedtime.  He is in [facility] and is liking that.  He does complain about some drooling.  PREVIOUS MEDICATIONS: Mirapex and artane, levodopa (made "too loose"); clonazepam (overly sleepy)  ALLERGIES:   Allergies  Allergen Reactions  . Penicillins Rash and Other (See Comments)    WEARS ALLERGY BRACELET Because of a history of documented adverse serious drug reaction;Medi Alert bracelet  is recommended PATIENT HAS HAD A PCN REACTION WITH IMMEDIATE RASH, FACIAL/TONGUE/THROAT SWELLING, SOB, OR LIGHTHEADEDNESS WITH HYPOTENSION:  #  #  YES  #  #  Has patient had a PCN reaction causing severe rash involving mucus membranes  or skin necrosis: unknown Has patient had a PCN reaction that required hospitalization NO Has patient had a PCN reaction occurring within the last 10 years: NO  . Klonopin [Clonazepam] Other (See Comments)    agitation  . Peanut-Containing Drug Products Cough  . Watermelon [Citrullus Vulgaris] Other (See Comments) and Cough    Tickle in throat    CURRENT MEDICATIONS:   Allergies as of 03/20/2018      Reactions   Penicillins Rash, Other (See Comments)   WEARS ALLERGY BRACELET Because of a history of documented adverse serious drug reaction;Medi Alert bracelet  is recommended PATIENT HAS HAD A PCN REACTION WITH IMMEDIATE RASH, FACIAL/TONGUE/THROAT SWELLING, SOB, OR LIGHTHEADEDNESS WITH HYPOTENSION:  #  #  YES  #  #  Has patient had a PCN reaction causing severe rash involving mucus membranes or skin necrosis: unknown Has patient had a PCN reaction that required hospitalization NO Has patient had a PCN reaction occurring within the last 10 years: NO   Klonopin [clonazepam] Other (See Comments)   agitation   Peanut-containing Drug Products Cough   Watermelon [citrullus Vulgaris] Other (See Comments), Cough   Tickle in throat      Medication List       Accurate as of March 20, 2018  9:53 AM. Always use your most recent med list.        acetaminophen 500 MG tablet Commonly known as:  TYLENOL Take 500 mg by mouth 2 (two) times daily as needed for moderate pain or headache.   albuterol 108 (90 Base) MCG/ACT inhaler Commonly known as:  PROVENTIL HFA;VENTOLIN HFA Inhale 2 puffs into the lungs every 6 (six) hours as needed for wheezing or shortness of breath.   allopurinol 100 MG tablet Commonly known as:  ZYLOPRIM Take 1.5 tablets (150 mg total) by mouth daily.   AMBULATORY NON FORMULARY MEDICATION Ustep walker DX: G20   aspirin EC 81 MG tablet Take 81 mg by mouth daily.   Carbidopa-Levodopa ER 25-100 MG tablet controlled release Commonly known as:  SINEMET CR Take 1-2  tablets by mouth See admin instructions. Take 2 tablets by mouth at 0700, take 2 tablets by mouth at 1200, take 1 tablet by mouth at 1600 and take 1 tablet by  mouth at bedtime   cholecalciferol 1000 units tablet Commonly known as:  VITAMIN D Take 1,000 Units by mouth daily.   colchicine 0.6 MG tablet TAKE 1 TABLET BY MOUTH TWICE A DAY AS NEEDED FOR GOUT   ferrous sulfate 325 (65 FE) MG EC tablet Take 325 mg by mouth 2 (two) times daily.   furosemide 80 MG tablet Commonly known as:  LASIX Take 1.5 tablets (120 mg total) by mouth daily.   gabapentin 100 MG capsule Commonly known as:  NEURONTIN Take 200 mg by mouth in the morning and take 400 mg by mouth at bedtime.  6 per day.   glucose blood test strip Commonly known as:  OneTouch Verio USE AS DIRECTED TO CHECK BLOOD SUGAR TWICE A DAY. DX:E11.65   glucose blood test strip 1 each by Other route as needed for other. Use as instructed to check blood sugar 4 times daily.   insulin lispro 100 UNIT/ML injection Commonly known as:  HUMALOG Uses via insulin pump OmniPod Dash.   levothyroxine 125 MCG tablet Commonly known as:  SYNTHROID, LEVOTHROID TAKE 1 TABLET EVERY DAY DAILY BEFORE BREAKFAST   liraglutide 18 MG/3ML Sopn Commonly known as:  Victoza Inject 0.3 mLs (1.8 mg total) into the skin daily.   lisinopril 2.5 MG tablet Commonly known as:  PRINIVIL,ZESTRIL Take 1 tablet (2.5 mg total) by mouth daily.   metolazone 2.5 MG tablet Commonly known as:  ZAROXOLYN TAKE 1 TABLET BY MOUTH EVERY DAY AS NEEDED FOR > 3LB IN A DAY OR > 5LB IN A WEEK   metoprolol succinate 50 MG 24 hr tablet Commonly known as:  TOPROL-XL TAKE 1 TABLET BY MOUTH DAILY. TAKE WITH OR IMMEDIATELY FOLLOWING A MEAL.   multivitamin with minerals Tabs tablet Take 1 tablet by mouth daily.   Myrbetriq 50 MG Tb24 tablet Generic drug:  mirabegron ER Take 50 mg by mouth daily.   nitroGLYCERIN 0.4 MG SL tablet Commonly known as:  NITROSTAT Place 1 tablet  (0.4 mg total) under the tongue every 5 (five) minutes as needed for chest pain.   OmniPod Dash System Kit 1 each by Does not apply route. Use Dash system for Continuous Blood Glucose Monitoring.   OVER THE COUNTER MEDICATION Apply 1 application topically at bedtime. Theraworx Pain Cream   polyethylene glycol packet Commonly known as:  MIRALAX / GLYCOLAX Take 17 g by mouth daily as needed for moderate constipation.   potassium chloride SA 20 MEQ tablet Commonly known as:  Klor-Con M20 Take 1 tablet (20 mEq total) by mouth 2 (two) times daily.   rosuvastatin 20 MG tablet Commonly known as:  CRESTOR TAKE 1 TABLET BY MOUTH EVERY DAY   traMADol 50 MG tablet Commonly known as:  ULTRAM TAKE 1 TABLET (50 MG TOTAL) BY MOUTH 2 TIMES DAILY AS NEEDED FOR MODERATE PAIN.   travoprost (benzalkonium) 0.004 % ophthalmic solution Commonly known as:  TRAVATAN Place 1 drop into the right eye at bedtime.   vitamin B-12 1000 MCG tablet Commonly known as:  CYANOCOBALAMIN Take 1,000 mcg by mouth daily.   Wixela Inhub 250-50 MCG/DOSE Aepb Generic drug:  Fluticasone-Salmeterol INHALE 1 PUFF INTO THE LUNGS 2 TIMES DAILY.        PAST MEDICAL HISTORY:   Past Medical History:  Diagnosis Date  . AICD (automatic cardioverter/defibrillator) present    Dr Lovena Le office visit yearly, MDT   . Arthritis    cane  . Asthma   . Benign neoplasm of colon   .  CAD (coronary artery disease)   . Cardiomyopathy   . Cataract   . Complication of anesthesia    pt states that he got a rash  . Constipation   . 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 GERD (gastroesophageal reflux disease)   . Glaucoma    right eye  . HLD (hyperlipidemia)   . HTN (hypertension)    pt denies 08/19/12  . Hyperplasia, prostate   . Hyperthyroidism    thyroid lobectomy  . MI (myocardial infarction) [facility]    Dr Stanford Breed 2000, x3vessels bypass  . Nephrolithiasis    . OSA (obstructive sleep apnea)    AHI-28,on CPAP, noncompliant with CPAP  . Parkinson disease ([place])    1999  . PONV (postoperative nausea and vomiting)   . Restless legs   . Shortness of breath    Hx: of at all times  . Sleep apnea    does not use CPAP  . Sleep apnea, organic   . UTI (lower urinary tract infection) 09/15/12   Klebsiella  . Ventral hernia     PAST SURGICAL HISTORY:   Past Surgical History:  Procedure Laterality Date  . 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 CARDIAC CATHETERIZATION    . CATARACT EXTRACTION W/ INTRAOCULAR LENS IMPLANT     Hx: of right eye  . CATARACT EXTRACTION W/ INTRAOCULAR LENS IMPLANT Left 2018  . COLONOSCOPY N/A 10/13/2014   Procedure: COLONOSCOPY;  Surgeon: Jerene Bears, MD;  Location: [facility];  Service: Gastroenterology;  Laterality: N/A;  . COLONOSCOPY W/ BIOPSIES AND POLYPECTOMY     Hx: of  . CORONARY ARTERY BYPASS GRAFT  2000   [name], MD  . [place]  . DEEP BRAIN STIMULATOR PLACEMENT  2004   Right and left VIN stimulator placement (parkinsons)  . EYE SURGERY    . FINGER AMPUTATION     left pointer  . IMPLANTABLE CARDIOVERTER DEFIBRILLATOR IMPLANT N/A 11/13/2013   Procedure: IMPLANTABLE CARDIOVERTER DEFIBRILLATOR IMPLANT;  Surgeon: Evans Lance, MD;  Location: [facility] CATH LAB;  Service: Cardiovascular;  Laterality: N/A;  . INSERT / REPLACE / REMOVE PACEMAKER    . LEFT AND RIGHT HEART CATHETERIZATION WITH CORONARY ANGIOGRAM N/A 09/24/2013   Procedure: LEFT AND RIGHT HEART CATHETERIZATION WITH CORONARY ANGIOGRAM;  Surgeon: Burnell Blanks, MD;  Location: [facility] CATH LAB;  Service: Cardiovascular;  Laterality: N/A;  . LITHOTRIPSY     3 different times  . MEDIAN STERNOTOMY  2000  . PULSE GENERATOR IMPLANT Right 11/13/2017   Procedure: Right chest implantable pulse generator change;  Surgeon: Erline Levine, MD;  Location: [place];  Service: Neurosurgery;  Laterality: Right;  Right chest  implantable pulse generator change  . SUBTHALAMIC STIMULATOR BATTERY REPLACEMENT N/A 09/05/2012   Procedure: Deep brain stimulator battery change;  Surgeon: Erline Levine, MD;  Location: [place] NEURO ORS;  Service: Neurosurgery;  Laterality: N/A;  Deep brain stimulator battery change  . SUBTHALAMIC STIMULATOR BATTERY REPLACEMENT N/A 06/10/2015   Procedure: Deep Brain stimulator battery change;  Surgeon: Erline Levine, MD;  Location: [place] NEURO ORS;  Service: Neurosurgery;  Laterality: N/A;  . TONSILLECTOMY      SOCIAL HISTORY:   Social History   Socioeconomic History  . Marital status: Married    Spouse name: [name]  . Number of children: 2  . Years of education: Not on  file  . Highest education level: Not on file  Occupational History  . Occupation: DISABLED    Comment: [employer], [employer]  Social Needs  . Financial resource strain: Not on file  . Food insecurity:    Worry: Not on file    Inability: Not on file  . Transportation needs:    Medical: Not on file    Non-medical: Not on file  Tobacco Use  . Smoking status: Never Smoker  . Smokeless tobacco: Never Used  Substance and Sexual Activity  . Alcohol use: Yes    Comment: occasional beer  . Drug use: No  . Sexual activity: Not on file  Lifestyle  . Physical activity:    Days per week: Not on file    Minutes per session: Not on file  . Stress: Not on file  Relationships  . Social connections:    Talks on phone: Not on file    Gets together: Not on file    Attends religious service: Not on file    Active member of club or organization: Not on file    Attends meetings of clubs or organizations: Not on file    Relationship status: Not on file  . Intimate partner violence:    Fear of current or ex partner: Not on file    Emotionally abused: Not on file    Physically abused: Not on file    Forced sexual activity: Not on file  Other Topics Concern  . Not on file  Social History Narrative   From [facility]    Retired/disability Clinical research associate   Likes to fish.     Married 1972   3 kids   Rupert fan    FAMILY HISTORY:   Family Status  Relation Name Status  . Mother  Deceased       complications of surgery  . Father  Deceased  . Brother  Alive       1 brother, 1 half brother  . Sister  Alive       2 half sisters  . Child  Alive  . Other  (Not Specified)  . MGM  Deceased  . MGF  Deceased  . PGM  Deceased  . PGF  Deceased  . Sister  Alive  . Brother  Alive  . Child  Alive  . Child  Alive  . Neg Hx  (Not Specified)    ROS:  Review of Systems  Constitutional: Negative.   HENT: Negative.   Eyes: Negative.   Respiratory: Negative.   Cardiovascular: Negative.   Skin: Negative.      PHYSICAL EXAMINATION:    VITALS:   Vitals:   03/20/18 0859  BP: 138/60  Pulse: 74  Temp: 97.6 F (36.4 C)  TempSrc: Oral  SpO2: 92%  Weight: 259 lb (117.5 kg)  Height: '5\' 9"'$  (1.753 m)   GEN:  The patient appears stated age and is in NAD. HEENT:  Normocephalic, atraumatic.  The mucous membranes are moist. The superficial temporal arteries are without ropiness or tenderness. CV:  RRR Lungs:  CTAB Neck/HEME:  There are no carotid bruits bilaterally. Skin: The skin/incision site is well-healed over the new battery site on the right chest.  However, the wire is visible underneath the skin and is quite tight, but the patient has full range of motion of the neck.  Neurological examination:  Orientation: The patient is alert and oriented x3. Cranial nerves: There is good facial symmetry.  Nasolabial fold is symmetric.  He is able to hold air tightly in the cheeks.  He is able to raise the forehead symmetrically.  He is able to squeeze the eyes tightly shut.  The speech is fluent and dysarthric than previous visits. Soft palate rises symmetrically and there is no tongue deviation. Hearing is intact to conversational tone. Sensation: Sensation is intact to light touch throughout Motor:  Strength is at least antigravity x4.   Shoulder shrug is equal and symmetric.  There is no pronator drift.  Movement examination: Tone: There is normal tone in the upper and lower extremities Abnormal movements: no tremor noted today (except when device off) Coordination:  There is  decremation with RAM's, mostly in the foot on the left. Gait and Station: The patient pushes off the chair.  He has his cane today and is ataxic some.  He festinates.    LABS  Lab Results  Component Value Date   WBC 7.5 11/08/2017   HGB 12.5 (L) 11/08/2017   HCT 40.3 11/08/2017   MCV 88.0 11/08/2017   PLT 198 11/08/2017   Lab Results  Component Value Date   TSH 3.51 03/12/2018     Chemistry      Component Value Date/Time   NA 136 03/12/2018 1018   NA 142 12/22/2016 1233   K 3.5 03/12/2018 1018   CL 96 03/12/2018 1018   CO2 32 03/12/2018 1018   BUN 30 (H) 03/12/2018 1018   BUN 33 (H) 12/22/2016 1233   CREATININE 1.63 (H) 03/12/2018 1018   CREATININE 2.13 06/25/2017   CREATININE 1.53 (H) 05/22/2016 1106      Component Value Date/Time   CALCIUM 9.0 03/12/2018 1018   ALKPHOS 103 03/12/2018 1018   AST 9 03/12/2018 1018   ALT 8 03/12/2018 1018   BILITOT 0.4 03/12/2018 1018     Lab Results  Component Value Date   ZOXWRUEA54 098 (H) 02/22/2017   Lab Results  Component Value Date   HGBA1C 7.8 (H) 03/12/2018   DBS programming was performed today, which is described in more detail on a separate programming procedural notes.  In brief, the device was turned off today independently on the right and left.  This did not change the paresthesias on the face.  ASSESSMENT/PLAN:  1.  Idiopathic Parkinson's disease.    -Now off trihexyphenidyl and pramipexole  -His DBS battery was last changed on 09/05/2012 and on 06/10/15 and November 13, 2017.  Encouraged the patient to move around the neck to avoid further scarring around the wire on the right.  -use walker at all times  -Continue  carbidopa/levodopa 25/100 CR, 2 tablets at 7am, 2 at noon, 1 at 4pm, 1 at bedtime.  Hopeful that this will help the cramping.  -Continue LSVT therapy at breakthrough therapy. 2.  RBD  -He has tried clonazepam several times and ultimately ends up stopping it because of side effect.  Could try Rozerem in the future if insurance would pay.  Decided to hold off for now as the patient states that while dreaming is vivid, it is not particularly bothersome and he is not falling out of the bed 3.  Pseudobulbar affect (crying)  -Wants no medication for that. 4.  OSAS, noncompliant with CPAP.  -Unfortunately, he has been unable to tolerate CPAP.  Talked about dental appliances, which he does not wish to try either. 5.  CHF  -ICD placed on 11/15/2013 6.  Facial paresthesias  -He had a CT of the brain in October, 2019.  It  is unclear if he had this symptom at that point in time.  We will go ahead and repeat that, especially given falls where he hit his head.  While I would like this with contrast, his kidneys likely would not tolerate this and we will do this without contrast.  His DBS device is not MRI compatible.  -I wonder if this is not a form of trigeminal neuralgia.  We discussed Trileptal in detail.  Ultimately, he states that he really does not want further medication.  He will let me know if he changes his mind.   7.  Sialorrhea  -This is commonly associated with PD.  We talked about treatments.  The patient is not a candidate for oral anticholinergic therapy because of increased risk of confusion and falls.  We discussed Botox (type A and B) and 1% atropine drops.  We discusssed that candy like lemon drops can help by stimulating mm of the oropharynx to induce swallowing.  He does not want any treatment right now. 8.  Follow-up at his previously scheduled visit.  Much greater than 50% of this visit was spent in counseling and coordinating care.  Total face to face time:  25 min

## 2018-03-19 NOTE — Telephone Encounter (Signed)
Appt made

## 2018-03-19 NOTE — Telephone Encounter (Signed)
Left message on machine for patient to call back.

## 2018-03-19 NOTE — Telephone Encounter (Signed)
See if patient wants to come in tomorrow at 9am.  I won't have a lot of time but can look at his device.

## 2018-03-20 ENCOUNTER — Ambulatory Visit
Admission: RE | Admit: 2018-03-20 | Discharge: 2018-03-20 | Disposition: A | Discharge status: Home / Self Care | Payer: Medicare Other | Source: Ambulatory Visit | Attending: Neurology | Admitting: Neurology

## 2018-03-20 ENCOUNTER — Telehealth: Payer: Self-pay | Admitting: Neurology

## 2018-03-20 ENCOUNTER — Ambulatory Visit (INDEPENDENT_AMBULATORY_CARE_PROVIDER_SITE_OTHER): Payer: Medicare Other | Admitting: Neurology

## 2018-03-20 ENCOUNTER — Other Ambulatory Visit: Payer: Self-pay

## 2018-03-20 ENCOUNTER — Encounter: Payer: Self-pay | Admitting: Neurology

## 2018-03-20 VITALS — BP 138/60 | HR 74 | Temp 97.6°F | Ht 69.0 in | Wt 259.0 lb

## 2018-03-20 DIAGNOSIS — W19XXXA Unspecified fall, initial encounter: Secondary | ICD-10-CM

## 2018-03-20 DIAGNOSIS — I255 Ischemic cardiomyopathy: Secondary | ICD-10-CM

## 2018-03-20 DIAGNOSIS — G20 Parkinson's disease: Secondary | ICD-10-CM

## 2018-03-20 DIAGNOSIS — R410 Disorientation, unspecified: Secondary | ICD-10-CM | POA: Diagnosis not present

## 2018-03-20 DIAGNOSIS — G500 Trigeminal neuralgia: Secondary | ICD-10-CM | POA: Diagnosis not present

## 2018-03-20 DIAGNOSIS — R296 Repeated falls: Secondary | ICD-10-CM | POA: Diagnosis not present

## 2018-03-20 DIAGNOSIS — S0990XA Unspecified injury of head, initial encounter: Secondary | ICD-10-CM

## 2018-03-20 DIAGNOSIS — R262 Difficulty in walking, not elsewhere classified: Secondary | ICD-10-CM | POA: Diagnosis not present

## 2018-03-20 DIAGNOSIS — M6281 Muscle weakness (generalized): Secondary | ICD-10-CM | POA: Diagnosis not present

## 2018-03-20 NOTE — Telephone Encounter (Signed)
Patient's wife made aware of CT results (patient in therapy).

## 2018-03-20 NOTE — Procedures (Signed)
DBS Programming was performed.    Total time spent programming was 8 minutes.  Device was confirmed to be on.  Soft start was confirmed to be on.  Impedences were checked and were within normal limits.  Battery was checked and was not at end of life (2.92).  Each side turned and left off independently but facial paresthesia remained.  At end of visit, device was turned back on and previous settings returned.   Detailed analysis on separate neurophysiologic worksheet.    Final settings were as follows:  Left brain electrode:     1-2+           ; Amplitude  4.4   V   ; Pulse width 90 microseconds;   Frequency   170   Hz.  Right brain electrode:     4-7+          ; Amplitude   4.5  V ;  Pulse width 90  microseconds;  Frequency   170    Hz. (had hand pull on the left with V of 4.6)

## 2018-03-20 NOTE — Telephone Encounter (Signed)
-----   Message from [place], DO sent at 03/20/2018 12:54 PM EDT ----- Let pt know that CT scan is stable.

## 2018-03-21 DIAGNOSIS — R296 Repeated falls: Secondary | ICD-10-CM | POA: Diagnosis not present

## 2018-03-21 DIAGNOSIS — M6281 Muscle weakness (generalized): Secondary | ICD-10-CM | POA: Diagnosis not present

## 2018-03-21 DIAGNOSIS — R262 Difficulty in walking, not elsewhere classified: Secondary | ICD-10-CM | POA: Diagnosis not present

## 2018-03-26 DIAGNOSIS — M6281 Muscle weakness (generalized): Secondary | ICD-10-CM | POA: Diagnosis not present

## 2018-03-26 DIAGNOSIS — R262 Difficulty in walking, not elsewhere classified: Secondary | ICD-10-CM | POA: Diagnosis not present

## 2018-03-26 DIAGNOSIS — R296 Repeated falls: Secondary | ICD-10-CM | POA: Diagnosis not present

## 2018-03-27 DIAGNOSIS — M6281 Muscle weakness (generalized): Secondary | ICD-10-CM | POA: Diagnosis not present

## 2018-03-27 DIAGNOSIS — R296 Repeated falls: Secondary | ICD-10-CM | POA: Diagnosis not present

## 2018-03-27 DIAGNOSIS — R262 Difficulty in walking, not elsewhere classified: Secondary | ICD-10-CM | POA: Diagnosis not present

## 2018-03-28 ENCOUNTER — Other Ambulatory Visit: Payer: Self-pay | Admitting: Family Medicine

## 2018-03-28 DIAGNOSIS — M6281 Muscle weakness (generalized): Secondary | ICD-10-CM | POA: Diagnosis not present

## 2018-03-28 DIAGNOSIS — R262 Difficulty in walking, not elsewhere classified: Secondary | ICD-10-CM | POA: Diagnosis not present

## 2018-03-28 DIAGNOSIS — R296 Repeated falls: Secondary | ICD-10-CM | POA: Diagnosis not present

## 2018-03-29 ENCOUNTER — Other Ambulatory Visit: Payer: Self-pay

## 2018-03-29 DIAGNOSIS — R296 Repeated falls: Secondary | ICD-10-CM | POA: Diagnosis not present

## 2018-03-29 DIAGNOSIS — R262 Difficulty in walking, not elsewhere classified: Secondary | ICD-10-CM | POA: Diagnosis not present

## 2018-03-29 DIAGNOSIS — M6281 Muscle weakness (generalized): Secondary | ICD-10-CM | POA: Diagnosis not present

## 2018-03-29 MED ORDER — LIRAGLUTIDE 18 MG/3ML ~~LOC~~ SOPN: 1.8000 mg | PEN_INJECTOR | Freq: Every day | SUBCUTANEOUS | 3 refills | Status: DC

## 2018-04-01 DIAGNOSIS — M6281 Muscle weakness (generalized): Secondary | ICD-10-CM | POA: Diagnosis not present

## 2018-04-01 DIAGNOSIS — R262 Difficulty in walking, not elsewhere classified: Secondary | ICD-10-CM | POA: Diagnosis not present

## 2018-04-01 DIAGNOSIS — R296 Repeated falls: Secondary | ICD-10-CM | POA: Diagnosis not present

## 2018-04-02 DIAGNOSIS — R296 Repeated falls: Secondary | ICD-10-CM | POA: Diagnosis not present

## 2018-04-02 DIAGNOSIS — R262 Difficulty in walking, not elsewhere classified: Secondary | ICD-10-CM | POA: Diagnosis not present

## 2018-04-02 DIAGNOSIS — M6281 Muscle weakness (generalized): Secondary | ICD-10-CM | POA: Diagnosis not present

## 2018-04-12 ENCOUNTER — Other Ambulatory Visit: Payer: Self-pay | Admitting: Neurology

## 2018-04-12 ENCOUNTER — Ambulatory Visit: Payer: Medicare Other

## 2018-04-16 ENCOUNTER — Encounter: Payer: Medicare Other | Admitting: Family Medicine

## 2018-04-23 ENCOUNTER — Other Ambulatory Visit: Payer: Self-pay | Admitting: Family Medicine

## 2018-04-23 NOTE — Telephone Encounter (Signed)
Name of Medication: Tramadol Name of Pharmacy: [facility] or Written Date and Quantity: 02/27/18 #60 tabs with 1 refill Last Office Visit and Type: Shoulder Pain on 12/28/17 Next Office Visit and Type: CPE 08/23/18

## 2018-04-24 ENCOUNTER — Telehealth: Payer: Self-pay | Admitting: Internal Medicine

## 2018-04-24 ENCOUNTER — Ambulatory Visit (INDEPENDENT_AMBULATORY_CARE_PROVIDER_SITE_OTHER): Payer: Medicare Other | Admitting: *Deleted

## 2018-04-24 ENCOUNTER — Other Ambulatory Visit: Payer: Self-pay

## 2018-04-24 DIAGNOSIS — I255 Ischemic cardiomyopathy: Secondary | ICD-10-CM

## 2018-04-24 DIAGNOSIS — I5022 Chronic systolic (congestive) heart failure: Secondary | ICD-10-CM | POA: Diagnosis not present

## 2018-04-24 LAB — CUP PACEART REMOTE DEVICE CHECK
Battery Remaining Longevity: 68 mo
Battery Voltage: 2.99 V
Brady Statistic AP VP Percent: 0 %
Brady Statistic AP VS Percent: 0.63 %
Brady Statistic AS VP Percent: 0.04 %
Brady Statistic AS VS Percent: 99.33 %
Brady Statistic RA Percent Paced: 0.63 %
Brady Statistic RV Percent Paced: 0.04 %
Date Time Interrogation Session: 20200415062609
HighPow Impedance: 73 Ohm
Implantable Lead Implant Date: 20151105
Implantable Lead Implant Date: 20151105
Implantable Lead Location: 753859
Implantable Lead Location: 753860
Implantable Lead Model: 5076
Implantable Pulse Generator Implant Date: 20151105
Lead Channel Impedance Value: 437 Ohm
Lead Channel Impedance Value: 456 Ohm
Lead Channel Impedance Value: 494 Ohm
Lead Channel Pacing Threshold Amplitude: 0.375 V
Lead Channel Pacing Threshold Amplitude: 1 V
Lead Channel Pacing Threshold Pulse Width: 0.4 ms
Lead Channel Pacing Threshold Pulse Width: 0.4 ms
Lead Channel Sensing Intrinsic Amplitude: 18.125 mV
Lead Channel Sensing Intrinsic Amplitude: 18.125 mV
Lead Channel Sensing Intrinsic Amplitude: 2.375 mV
Lead Channel Sensing Intrinsic Amplitude: 2.375 mV
Lead Channel Setting Pacing Amplitude: 2 V
Lead Channel Setting Pacing Amplitude: 2.5 V
Lead Channel Setting Pacing Pulse Width: 0.4 ms
Lead Channel Setting Sensing Sensitivity: 0.3 mV

## 2018-04-24 NOTE — Telephone Encounter (Signed)
Follow up   Returned call to patient wife about appt on 04.21.20 with Dr. Lovena Le. Pt agrees to do a visit on doxemity with provider. Please use wifes cell phone number listed in appt notes. Obtained verbal consent.

## 2018-04-24 NOTE — Telephone Encounter (Signed)
Follow Up:; ° ° °Returning your call. °

## 2018-04-24 NOTE — Telephone Encounter (Signed)
Sent. Thanks.   

## 2018-04-24 NOTE — Telephone Encounter (Signed)
New message   [facility] for pt to call back about appt on 04.21.20 with Dr. Lovena Le. Will offer patient appt through doxemity.

## 2018-04-29 ENCOUNTER — Encounter: Payer: Self-pay | Admitting: Family Medicine

## 2018-04-30 ENCOUNTER — Other Ambulatory Visit: Payer: Self-pay

## 2018-04-30 ENCOUNTER — Telehealth (INDEPENDENT_AMBULATORY_CARE_PROVIDER_SITE_OTHER): Payer: Medicare Other | Admitting: Internal Medicine

## 2018-04-30 DIAGNOSIS — I255 Ischemic cardiomyopathy: Secondary | ICD-10-CM | POA: Diagnosis not present

## 2018-04-30 DIAGNOSIS — I5022 Chronic systolic (congestive) heart failure: Secondary | ICD-10-CM | POA: Diagnosis not present

## 2018-04-30 DIAGNOSIS — Z95810 Presence of automatic (implantable) cardiac defibrillator: Secondary | ICD-10-CM | POA: Diagnosis not present

## 2018-04-30 NOTE — Progress Notes (Signed)
Electrophysiology TeleHealth Note   Due to national recommendations of social distancing due to COVID 19, an audio/video telehealth visit is felt to be most appropriate for this patient at this time.  See MyChart message from today for the patient's consent to telehealth for [facility].   Date:  04/30/2018   ID:  Henry [name], DOB [birth year], MRN [id number]  Location: patient's home  Provider location: [address] [place]  Evaluation Performed: Follow-up visit  PCP:  Tonia Ghent, MD  Cardiologist:  No primary care provider on file. Kirk Ruths Electrophysiologist:  Dr Lovena Le  Chief Complaint:  "I been doing ok"  History of Present Illness:    Henry [name] is a 68 y.o. male who presents via audio/video conferencing for a telehealth visit today.  hehas a longstanding ICM, chronic systolic heart failure, s/p ICD insertion. He remains very sedentary. His fluid volume has been stable. He rarely takes metolazone. He has a deep brains stimulator which is being adjusted. He has tingling in his face. No ICD shocks. He denies edema. Since last being seen in our clinic, the patient reports doing fairly well.  The patient denies symptoms of fevers, chills, cough, or new SOB worrisome for COVID 19 Past Medical History:  Diagnosis Date  . AICD (automatic cardioverter/defibrillator) present    Dr Lovena Le office visit yearly, MDT   . Arthritis    cane  . Asthma   . Benign neoplasm of colon   . CAD (coronary artery disease)   . Cardiomyopathy   . Cataract   . Complication of anesthesia    pt states that he got a rash  . Constipation   . 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 GERD (gastroesophageal reflux disease)   . Glaucoma    right eye  . HLD (hyperlipidemia)   . HTN (hypertension)    pt denies 08/19/12  . Hyperplasia, prostate   . Hyperthyroidism    thyroid lobectomy  . MI  (myocardial infarction) [facility])    Dr Stanford Breed 2000, x3vessels bypass  . Nephrolithiasis   . OSA (obstructive sleep apnea)    AHI-28,on CPAP, noncompliant with CPAP  . Parkinson disease ([place])    1999  . PONV (postoperative nausea and vomiting)   . Restless legs   . Shortness of breath    Hx: of at all times  . Sleep apnea    does not use CPAP  . Sleep apnea, organic   . UTI (lower urinary tract infection) 09/15/12   Klebsiella  . Ventral hernia     Past Surgical History:  Procedure Laterality Date  . 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 CARDIAC CATHETERIZATION    . CATARACT EXTRACTION W/ INTRAOCULAR LENS IMPLANT     Hx: of right eye  . CATARACT EXTRACTION W/ INTRAOCULAR LENS IMPLANT Left 2018  . COLONOSCOPY N/A 10/13/2014   Procedure: COLONOSCOPY;  Surgeon: Jerene Bears, MD;  Location: [facility];  Service: Gastroenterology;  Laterality: N/A;  . COLONOSCOPY W/ BIOPSIES AND POLYPECTOMY     Hx: of  . CORONARY ARTERY BYPASS GRAFT  2000   [name], MD  . [place]  . DEEP BRAIN STIMULATOR PLACEMENT  2004   Right and left VIN stimulator placement (parkinsons)  . EYE SURGERY    . FINGER AMPUTATION  left pointer  . IMPLANTABLE CARDIOVERTER DEFIBRILLATOR IMPLANT N/A 11/13/2013   Procedure: IMPLANTABLE CARDIOVERTER DEFIBRILLATOR IMPLANT;  Surgeon: Evans Lance, MD;  Location: [facility] CATH LAB;  Service: Cardiovascular;  Laterality: N/A;  . INSERT / REPLACE / REMOVE PACEMAKER    . LEFT AND RIGHT HEART CATHETERIZATION WITH CORONARY ANGIOGRAM N/A 09/24/2013   Procedure: LEFT AND RIGHT HEART CATHETERIZATION WITH CORONARY ANGIOGRAM;  Surgeon: Burnell Blanks, MD;  Location: [facility] CATH LAB;  Service: Cardiovascular;  Laterality: N/A;  . LITHOTRIPSY     3 different times  . MEDIAN STERNOTOMY  2000  . PULSE GENERATOR IMPLANT Right 11/13/2017   Procedure: Right chest implantable pulse generator change;  Surgeon: Erline Levine, MD;  Location: [place];  Service: Neurosurgery;  Laterality: Right;  Right chest implantable pulse generator change  . SUBTHALAMIC STIMULATOR BATTERY REPLACEMENT N/A 09/05/2012   Procedure: Deep brain stimulator battery change;  Surgeon: Erline Levine, MD;  Location: [place] NEURO ORS;  Service: Neurosurgery;  Laterality: N/A;  Deep brain stimulator battery change  . SUBTHALAMIC STIMULATOR BATTERY REPLACEMENT N/A 06/10/2015   Procedure: Deep Brain stimulator battery change;  Surgeon: Erline Levine, MD;  Location: [place] NEURO ORS;  Service: Neurosurgery;  Laterality: N/A;  . TONSILLECTOMY      Current Outpatient Medications  Medication Sig Dispense Refill  . acetaminophen (TYLENOL) 500 MG tablet Take 500 mg by mouth 2 (two) times daily as needed for moderate pain or headache.    . albuterol (PROVENTIL HFA;VENTOLIN HFA) 108 (90 Base) MCG/ACT inhaler Inhale 2 puffs into the lungs every 6 (six) hours as needed for wheezing or shortness of breath. 18 g 5  . allopurinol (ZYLOPRIM) 100 MG tablet Take 1.5 tablets (150 mg total) by mouth daily.    . AMBULATORY NON FORMULARY MEDICATION Ustep walker DX: G20 1 Device 0  . aspirin EC 81 MG tablet Take 81 mg by mouth daily.    . Carbidopa-Levodopa ER (SINEMET CR) 25-100 MG tablet controlled release TAKE 2 TABS AT 7 AM, THEN 2 TABS AT 12PM, THEN 1 TABS AT 4PM, 1 TAB AT BEDTIME 540 tablet 1  . cholecalciferol (VITAMIN D) 1000 units tablet Take 1,000 Units by mouth daily.    . colchicine 0.6 MG tablet TAKE 1 TABLET BY MOUTH TWICE A DAY AS NEEDED FOR GOUT 30 tablet 1  . ferrous sulfate 325 (65 FE) MG EC tablet Take 325 mg by mouth 2 (two) times daily.     . furosemide (LASIX) 80 MG tablet TAKE 1 & 1/2 TABLETS DAILY 135 tablet 1  . gabapentin (NEURONTIN) 100 MG capsule Take 200 mg by mouth in the morning and take 400 mg by mouth at bedtime.  6 per day. 540 capsule 1  . glucose blood (ONETOUCH VERIO) test strip USE AS DIRECTED TO CHECK BLOOD SUGAR TWICE A DAY. DX:E11.65 100 each 3  . glucose  blood test strip 1 each by Other route as needed for other. Use as instructed to check blood sugar 4 times daily. 150 each 3  . Insulin Disposable Pump (OMNIPOD DASH SYSTEM) KIT 1 each by Does not apply route. Use Dash system for Continuous Blood Glucose Monitoring.    . insulin lispro (HUMALOG) 100 UNIT/ML injection Uses via insulin pump OmniPod Dash.    . levothyroxine (SYNTHROID, LEVOTHROID) 125 MCG tablet TAKE 1 TABLET EVERY DAY DAILY BEFORE BREAKFAST 90 tablet 1  . liraglutide (VICTOZA) 18 MG/3ML SOPN Inject 0.3 mLs (1.8 mg total) into the skin daily. 27 mL 3  .  lisinopril (PRINIVIL,ZESTRIL) 2.5 MG tablet Take 1 tablet (2.5 mg total) by mouth daily. 90 tablet 3  . metolazone (ZAROXOLYN) 2.5 MG tablet TAKE 1 TABLET BY MOUTH EVERY DAY AS NEEDED FOR > 3LB IN A DAY OR > 5LB IN A WEEK 90 tablet 1  . metoprolol succinate (TOPROL-XL) 50 MG 24 hr tablet TAKE 1 TABLET BY MOUTH DAILY. TAKE WITH OR IMMEDIATELY FOLLOWING A MEAL. 90 tablet 1  . Multiple Vitamin (MULTIVITAMIN WITH MINERALS) TABS tablet Take 1 tablet by mouth daily.     Marland Kitchen MYRBETRIQ 50 MG TB24 tablet Take 50 mg by mouth daily.  11  . nitroGLYCERIN (NITROSTAT) 0.4 MG SL tablet Place 1 tablet (0.4 mg total) under the tongue every 5 (five) minutes as needed for chest pain. (Patient not taking: Reported on 11/07/2017) 25 tablet 2  . OVER THE COUNTER MEDICATION Apply 1 application topically at bedtime. Theraworx Pain Cream    . polyethylene glycol (MIRALAX / GLYCOLAX) packet Take 17 g by mouth daily as needed for moderate constipation.    . potassium chloride SA (KLOR-CON M20) 20 MEQ tablet Take 1 tablet (20 mEq total) by mouth 2 (two) times daily.    . rosuvastatin (CRESTOR) 20 MG tablet TAKE 1 TABLET BY MOUTH EVERY DAY 90 tablet 3  . traMADol (ULTRAM) 50 MG tablet TAKE 1 TABLET BY MOUTH 2 TIMES DAILY AS NEEDED FOR MODERATE PAIN. 60 tablet 1  . travoprost, benzalkonium, (TRAVATAN) 0.004 % ophthalmic solution Place 1 drop into the right eye at  bedtime.     . vitamin B-12 (CYANOCOBALAMIN) 1000 MCG tablet Take 1,000 mcg by mouth daily.    Grant Ruts INHUB 250-50 MCG/DOSE AEPB INHALE 1 PUFF INTO THE LUNGS 2 TIMES DAILY. 180 each 3   No current facility-administered medications for this visit.     Allergies:   Penicillins; Klonopin [clonazepam]; Peanut-containing drug products; and Watermelon [citrullus vulgaris]   Social History:  The patient  reports that he has never smoked. He has never used smokeless tobacco. He reports current alcohol use. He reports that he does not use drugs.   Family History:  The patient's  family history includes Alcoholism in his father; Aneurysm in his mother; Arthritis in an other family member; HIV/AIDS (age of onset: 83) in his brother; Healthy in his child, child, and child; Peripheral vascular disease in an other family member.   ROS:  Please see the history of present illness.   All other systems are personally reviewed and negative.    Exam:    Vital Signs:  Wt. 255 lbs  Well appearing, alert and conversant, regular work of breathing,  good skin color Eyes- anicteric, neuro- grossly intact, skin- no apparent rash or lesions or cyanosis, mouth- oral mucosa is pink   Labs/Other Tests and Data Reviewed:    Recent Labs: 11/08/2017: Hemoglobin 12.5; Platelets 198 03/12/2018: ALT 8; BUN 30; Creatinine, Ser 1.63; Potassium 3.5; Sodium 136; TSH 3.51   Wt Readings from Last 3 Encounters:  03/20/18 259 lb (117.5 kg)  03/15/18 258 lb (117 kg)  02/11/18 256 lb 8 oz (116.3 kg)     Other studies personally reviewed:  Last device remote is reviewed from [place] PDF dated 04/24/18 which reveals normal device function, no arrhythmias    ASSESSMENT & PLAN:    1.  Chronic systolic heart failure - his symptoms are well compensated. He denies sob. I have encouraged him to avoid salty food.  2. ICD - his medtronic DDD ICD is  working normally.  3. CAD - He is fairly sedentary. He denies anginal symptoms.   4. Obesity - review of his ICD demonstrates that he is fairly sedentary.  5. COVID 19 screen The patient denies symptoms of COVID 19 at this time.  The importance of social distancing was discussed today.  Follow-up:  In one year Next remote: 7/20  Current medicines are reviewed at length with the patient today.   The patient does not have concerns regarding his medicines.  The following changes were made today:  none  Labs/ tests ordered today include:  No orders of the defined types were placed in this encounter.    Patient Risk:  after full review of this patients clinical status, I feel that they are at moderate risk at this time.  Today, I have spent 25 minutes with the patient with telehealth technology discussing the above.    Signed, Cristopher Peru, MD  04/30/2018 2:02 PM     [address] [place] [address] [phone] (office) [phone] (fax)

## 2018-04-30 NOTE — Progress Notes (Signed)
Remote ICD transmission.   

## 2018-05-08 DIAGNOSIS — H00015 Hordeolum externum left lower eyelid: Secondary | ICD-10-CM | POA: Diagnosis not present

## 2018-05-17 ENCOUNTER — Other Ambulatory Visit: Payer: Self-pay | Admitting: Family Medicine

## 2018-05-17 DIAGNOSIS — I509 Heart failure, unspecified: Secondary | ICD-10-CM

## 2018-05-17 DIAGNOSIS — H00015 Hordeolum externum left lower eyelid: Secondary | ICD-10-CM | POA: Diagnosis not present

## 2018-05-19 NOTE — Telephone Encounter (Signed)
Sent. Thanks.   

## 2018-05-31 DIAGNOSIS — H4031X3 Glaucoma secondary to eye trauma, right eye, severe stage: Secondary | ICD-10-CM | POA: Diagnosis not present

## 2018-05-31 DIAGNOSIS — H40032 Anatomical narrow angle, left eye: Secondary | ICD-10-CM | POA: Diagnosis not present

## 2018-05-31 DIAGNOSIS — H35033 Hypertensive retinopathy, bilateral: Secondary | ICD-10-CM | POA: Diagnosis not present

## 2018-06-12 ENCOUNTER — Other Ambulatory Visit: Payer: Self-pay | Admitting: Endocrinology

## 2018-06-14 ENCOUNTER — Other Ambulatory Visit: Payer: Self-pay

## 2018-06-14 ENCOUNTER — Other Ambulatory Visit (INDEPENDENT_AMBULATORY_CARE_PROVIDER_SITE_OTHER): Payer: Medicare Other

## 2018-06-14 DIAGNOSIS — Z794 Long term (current) use of insulin: Secondary | ICD-10-CM

## 2018-06-14 DIAGNOSIS — E1165 Type 2 diabetes mellitus with hyperglycemia: Secondary | ICD-10-CM

## 2018-06-14 LAB — COMPREHENSIVE METABOLIC PANEL
ALT: 7 U/L (ref 0–53)
AST: 8 U/L (ref 0–37)
Albumin: 4.1 g/dL (ref 3.5–5.2)
Alkaline Phosphatase: 105 U/L (ref 39–117)
BUN: 53 mg/dL — ABNORMAL HIGH (ref 6–23)
CO2: 34 mEq/L — ABNORMAL HIGH (ref 19–32)
Calcium: 9.1 mg/dL (ref 8.4–10.5)
Chloride: 91 mEq/L — ABNORMAL LOW (ref 96–112)
Creatinine, Ser: 2.31 mg/dL — ABNORMAL HIGH (ref 0.40–1.50)
GFR: 28.37 mL/min — ABNORMAL LOW (ref >60.00)
Glucose, Bld: 180 mg/dL — ABNORMAL HIGH (ref 70–99)
Potassium: 3.6 mEq/L (ref 3.5–5.1)
Sodium: 135 mEq/L (ref 135–145)
Total Bilirubin: 0.5 mg/dL (ref 0.2–1.2)
Total Protein: 6.8 g/dL (ref 6.0–8.3)

## 2018-06-14 LAB — MICROALBUMIN / CREATININE URINE RATIO
Creatinine,U: 67.3 mg/dL
Microalb Creat Ratio: 1 mg/g (ref 0.0–30.0)
Microalb, Ur: <0.7 mg/dL (ref 0.0–1.9)

## 2018-06-14 LAB — HEMOGLOBIN A1C: Hgb A1c MFr Bld: 8.5 % — ABNORMAL HIGH (ref 4.6–6.5)

## 2018-06-17 ENCOUNTER — Encounter: Payer: Self-pay | Admitting: Endocrinology

## 2018-06-17 ENCOUNTER — Other Ambulatory Visit: Payer: Self-pay

## 2018-06-17 ENCOUNTER — Ambulatory Visit (INDEPENDENT_AMBULATORY_CARE_PROVIDER_SITE_OTHER): Payer: Medicare Other | Admitting: Endocrinology

## 2018-06-17 VITALS — BP 108/58 | HR 77 | Ht 69.0 in | Wt 259.2 lb

## 2018-06-17 DIAGNOSIS — I255 Ischemic cardiomyopathy: Secondary | ICD-10-CM | POA: Diagnosis not present

## 2018-06-17 DIAGNOSIS — E1165 Type 2 diabetes mellitus with hyperglycemia: Secondary | ICD-10-CM | POA: Diagnosis not present

## 2018-06-17 DIAGNOSIS — E890 Postprocedural hypothyroidism: Secondary | ICD-10-CM

## 2018-06-17 DIAGNOSIS — Z794 Long term (current) use of insulin: Secondary | ICD-10-CM

## 2018-06-17 NOTE — Patient Instructions (Addendum)
Check blood sugars on waking up 4 days a week  Also check blood sugars about 2 hours after meals and do this after different meals by rotation  Recommended blood sugar levels on waking up are 90-130 and about 2 hours after meal is 130-180  Please bring your blood sugar monitor to each visit, thank you   BOLUSES for meals:  If you are blood sugars are going up more than 50 points after the meal then you need to bolus at least 2 units more for that meal  For instance if your sugars going up from 130 all the way to 210 after dinner then you will enter 80 carbs instead of 60 at dinner which will give you 8 units  Basal rate at midnight will be increased up to 1.7

## 2018-06-17 NOTE — Progress Notes (Signed)
Patient ID: [name], male   DOB: 07-Jul-1950, 68 y.o.   MRN: [id number]           Reason for Appointment: Follow-up for Type 2 Diabetes   History of Present Illness:          Diagnosis: Type 2 diabetes mellitus, date of diagnosis: 2000      Past history:   Patient thinks he has been taking Amaryl for several years and probably metformin since onset also At some point he was changed from metformin to [name] and was taking this since at least 2012 A1c had been higher in 2015 Since his A1c had been progressively higher with his regimen of Janumet and Amaryl he was started on Victoza in 01/2014 He was started on Lantus insulin in 5/16 because of persistent hyperglycemia especially fasting; was having readings as high as 293 Because of tendency to high postprandial readings and for control with Lantus he was switched to the V-go pump in  09/2014  Recent history:    INSULIN regimen: Omnipod insulin pump Basal rate programs: Midnight = 1.6, 7 AM = 1.8, 6p = 2.0, total insulin 43 units  Boluses 4 units for breakfast and lunch and 8 units for dinner  Non-insulin hypoglycemic drugs the patient is taking are:  Victoza 1.8 mg daily in am   His A1c progressively higher at 8.5: Has been as low as 7.2  Current management, blood sugar patterns and problems identified:  His blood sugars are not being checked regularly lately and mostly in the morning  He has also not checked readings after meals  With this difficult to assess his insulin requirement at various times  He says that his blood sugars are higher because of eating more tasty food made at home  Also his wife thinks that sometimes he will eat snacks like potato chips at various times and some eating during the night again  He only has rarely bolused for snacks, recently did bolus 2 units for ice cream  Most of his fasting blood sugars are high  Also no hypoglycemia at any time he does try to bolus for every meal but not  adjusting the dose based on what he is eating or the amount of carbohydrates  He says he is a little less active than before because of not getting his physical therapy  Weight is about the same  Hypoglycemia:   none  He has seen the dietitian in 04/2017  Not able to exercise because of back pain and difficulty walking       Side effects from medications have been: None Compliance with the medical regimen: Fair   Glucose monitoring:  Recently less than 2 times  a day         Glucometer:  Freestyle      Blood Glucose readings by download of pump below          PRE-MEAL Fasting Lunch Dinner Bedtime Overall  Glucose range:  146-303  155-185  103-261 ?   Mean/median:  177   180  178 +/-31   Previous readings:  PRE-MEAL Fasting Lunch Dinner Bedtime Overall  Glucose range:  101-205  80, 169  117-234    Mean/median:  145   155   155   POST-MEAL PC Breakfast PC Lunch PC Dinner  Glucose range:    158-194  Mean/median:       Meals: 9 am , 2-3 pm and 6-7 pm  Dietician visit, most recent: 4/19               Weight history:   Wt Readings from Last 3 Encounters:  06/17/18 259 lb 3.2 oz (117.6 kg)  03/20/18 259 lb (117.5 kg)  03/15/18 258 lb (117 kg)    Glycemic control:   Lab Results  Component Value Date   HGBA1C 8.5 (H) 06/14/2018   HGBA1C 7.8 (H) 03/12/2018   HGBA1C 7.2 (H) 11/08/2017   Lab Results  Component Value Date   MICROALBUR <0.7 06/14/2018   LDLCALC 118 (H) 09/16/2015   CREATININE 2.31 (H) 06/14/2018    Lab Results  Component Value Date   FRUCTOSAMINE 277 11/09/2017   FRUCTOSAMINE 350 (H) 03/29/2017   FRUCTOSAMINE 250 09/16/2015     Other active problems: See review of systems      Allergies as of 06/17/2018      Reactions   Penicillins Rash, Other (See Comments)   WEARS ALLERGY BRACELET Because of a history of documented adverse serious drug reaction;Medi Alert bracelet  is recommended PATIENT HAS HAD A PCN REACTION WITH IMMEDIATE  RASH, FACIAL/TONGUE/THROAT SWELLING, SOB, OR LIGHTHEADEDNESS WITH HYPOTENSION:  #  #  YES  #  #  Has patient had a PCN reaction causing severe rash involving mucus membranes or skin necrosis: unknown Has patient had a PCN reaction that required hospitalization NO Has patient had a PCN reaction occurring within the last 10 years: NO   Klonopin [clonazepam] Other (See Comments)   agitation   Peanut-containing Drug Products Cough   Watermelon [citrullus Vulgaris] Other (See Comments), Cough   Tickle in throat      Medication List       Accurate as of June 17, 2018  3:32 PM. If Henry have any questions, ask your nurse or doctor.        STOP taking these medications   Myrbetriq 50 MG Tb24 tablet Generic drug:  mirabegron ER Stopped by:  Henry Snare, MD     TAKE these medications   acetaminophen 500 MG tablet Commonly known as:  TYLENOL Take 500 mg by mouth 2 (two) times daily as needed for moderate pain or headache.   albuterol 108 (90 Base) MCG/ACT inhaler Commonly known as:  VENTOLIN HFA Inhale 2 puffs into the lungs every 6 (six) hours as needed for wheezing or shortness of breath.   allopurinol 100 MG tablet Commonly known as:  ZYLOPRIM Take 1.5 tablets (150 mg total) by mouth daily.   AMBULATORY NON FORMULARY MEDICATION Ustep walker DX: G20   aspirin EC 81 MG tablet Take 81 mg by mouth daily.   Carbidopa-Levodopa ER 25-100 MG tablet controlled release Commonly known as:  SINEMET CR TAKE 2 TABS AT 7 AM, THEN 2 TABS AT 12PM, THEN 1 TABS AT 4PM, 1 TAB AT BEDTIME   cholecalciferol 1000 units tablet Commonly known as:  VITAMIN D Take 1,000 Units by mouth daily.   colchicine 0.6 MG tablet TAKE 1 TABLET BY MOUTH TWICE A DAY AS NEEDED FOR GOUT   ferrous sulfate 325 (65 FE) MG EC tablet Take 325 mg by mouth daily.   furosemide 80 MG tablet Commonly known as:  LASIX TAKE 1 & 1/2 TABLETS DAILY   gabapentin 100 MG capsule Commonly known as:  NEURONTIN Take 200 mg by  mouth in the morning and take 400 mg by mouth at bedtime.  6 per day.   glucose blood test strip Commonly known as:  OneTouch Verio USE AS  DIRECTED TO CHECK BLOOD SUGAR TWICE A DAY. DX:E11.65   glucose blood test strip 1 each by Other route as needed for other. Use as instructed to check blood sugar 4 times daily.   HumaLOG 100 UNIT/ML injection Generic drug:  insulin lispro USE MAXIMUM 76 UNITS PER   DAY WITH V-GO PUMP   Klor-Con M20 20 MEQ tablet Generic drug:  potassium chloride SA TAKE 2 TABLETS (40 MEQ TOTAL) BY MOUTH DAILY.   levothyroxine 125 MCG tablet Commonly known as:  SYNTHROID TAKE 1 TABLET EVERY DAY DAILY BEFORE BREAKFAST   liraglutide 18 MG/3ML Sopn Commonly known as:  Victoza Inject 0.3 mLs (1.8 mg total) into the skin daily.   lisinopril 2.5 MG tablet Commonly known as:  ZESTRIL Take 1 tablet (2.5 mg total) by mouth daily.   metolazone 2.5 MG tablet Commonly known as:  ZAROXOLYN TAKE 1 TABLET BY MOUTH EVERY DAY AS NEEDED FOR > 3LB IN A DAY OR > 5LB IN A WEEK   metoprolol succinate 50 MG 24 hr tablet Commonly known as:  TOPROL-XL TAKE 1 TABLET BY MOUTH DAILY. TAKE WITH OR IMMEDIATELY FOLLOWING A MEAL.   multivitamin with minerals Tabs tablet Take 1 tablet by mouth daily.   nitroGLYCERIN 0.4 MG SL tablet Commonly known as:  NITROSTAT Place 1 tablet (0.4 mg total) under the tongue every 5 (five) minutes as needed for chest pain.   OmniPod Dash System Kit 1 each by Does not apply route. Use Dash system for Continuous Blood Glucose Monitoring.   OVER THE COUNTER MEDICATION Apply 1 application topically at bedtime. Theraworx Pain Cream   polyethylene glycol 17 g packet Commonly known as:  MIRALAX / GLYCOLAX Take 17 g by mouth daily as needed for moderate constipation.   rosuvastatin 20 MG tablet Commonly known as:  CRESTOR TAKE 1 TABLET BY MOUTH EVERY DAY   traMADol 50 MG tablet Commonly known as:  ULTRAM TAKE 1 TABLET BY MOUTH 2 TIMES DAILY AS  NEEDED FOR MODERATE PAIN.   travoprost (benzalkonium) 0.004 % ophthalmic solution Commonly known as:  TRAVATAN Place 1 drop into the right eye at bedtime.   vitamin B-12 1000 MCG tablet Commonly known as:  CYANOCOBALAMIN Take 1,000 mcg by mouth daily.   Wixela Inhub 250-50 MCG/DOSE Aepb Generic drug:  Fluticasone-Salmeterol INHALE 1 PUFF INTO THE LUNGS 2 TIMES DAILY.       Allergies:  Allergies  Allergen Reactions   Penicillins Rash and Other (See Comments)    WEARS ALLERGY BRACELET Because of a history of documented adverse serious drug reaction;Medi Alert bracelet  is recommended PATIENT HAS HAD A PCN REACTION WITH IMMEDIATE RASH, FACIAL/TONGUE/THROAT SWELLING, SOB, OR LIGHTHEADEDNESS WITH HYPOTENSION:  #  #  YES  #  #  Has patient had a PCN reaction causing severe rash involving mucus membranes or skin necrosis: unknown Has patient had a PCN reaction that required hospitalization NO Has patient had a PCN reaction occurring within the last 10 years: NO   Klonopin [Clonazepam] Other (See Comments)    agitation   Peanut-Containing Drug Products Cough   Watermelon [Citrullus Vulgaris] Other (See Comments) and Cough    Tickle in throat    Past Medical History:  Diagnosis Date   AICD (automatic cardioverter/defibrillator) present    Dr Lovena Le office visit yearly, MDT    Arthritis    cane   Asthma    Benign neoplasm of colon    CAD (coronary artery disease)    Cardiomyopathy  Cataract    Complication of anesthesia    pt states that he got a rash   Constipation    Deaf    rightear, 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)   DM (diabetes mellitus) ([place])    TYPE 2    GERD (gastroesophageal reflux disease)    Glaucoma    right eye   HLD (hyperlipidemia)    HTN (hypertension)    pt denies 08/19/12   Hyperplasia, prostate    Hyperthyroidism    thyroid lobectomy   MI (myocardial infarction) ([place])    Dr Stanford Breed 2000, x3vessels bypass    Nephrolithiasis    OSA (obstructive sleep apnea)    AHI-28,on CPAP, noncompliant with CPAP   Parkinson disease ([place])    1999   PONV (postoperative nausea and vomiting)    Restless legs    Shortness of breath    Hx: of at all times   Sleep apnea    does not use CPAP   Sleep apnea, organic    UTI (lower urinary tract infection) 09/15/12   Klebsiella   Ventral hernia     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   CARDIAC CATHETERIZATION     CATARACT EXTRACTION W/ INTRAOCULAR LENS IMPLANT     Hx: of right eye   CATARACT EXTRACTION W/ INTRAOCULAR LENS IMPLANT Left 2018   COLONOSCOPY N/A 10/13/2014   Procedure: COLONOSCOPY;  Surgeon: Jerene Bears, MD;  Location: [facility];  Service: Gastroenterology;  Laterality: N/A;   COLONOSCOPY W/ BIOPSIES AND POLYPECTOMY     Hx: of   CORONARY ARTERY BYPASS GRAFT  2000   [name], MD   CORONARY STENT PLACEMENT  1998   DEEP BRAIN STIMULATOR PLACEMENT  2004   Right and left VIN stimulator placement (parkinsons)   EYE SURGERY     FINGER AMPUTATION     left pointer   IMPLANTABLE CARDIOVERTER DEFIBRILLATOR IMPLANT N/A 11/13/2013   Procedure: IMPLANTABLE CARDIOVERTER DEFIBRILLATOR IMPLANT;  Surgeon: Evans Lance, MD;  Location: [facility] C[facility] LAB;  Service: Cardiovascular;  Laterality: N/A;   INSERT / REPLACE / REMOVE PACEMAKER     LEFT AND RIGHT HEART CATHETERIZATION WITH CORONARY ANGIOGRAM N/A 09/24/2013   Procedure: LEFT AND RIGHT HEART CATHETERIZATION WITH CORONARY ANGIOGRAM;  Surgeon: Burnell Blanks, MD;  Location: [facility] CATH LAB;  Service: Cardiovascular;  Laterality: N/A;   LITHOTRIPSY     3 different times   MEDIAN STERNOTOMY  2000   PULSE GENERATOR IMPLANT Right 11/13/2017   Procedure: Right chest implantable pulse generator change;  Surgeon: Erline Levine, MD;  Location: [place];  Service: Neurosurgery;  Laterality: Right;  Right chest implantable  pulse generator change   SUBTHALAMIC STIMULATOR BATTERY REPLACEMENT N/A 09/05/2012   Procedure: Deep brain stimulator battery change;  Surgeon: Erline Levine, MD;  Location: [place] NEURO ORS;  Service: Neurosurgery;  Laterality: N/A;  Deep brain stimulator battery change   SUBTHALAMIC STIMULATOR BATTERY REPLACEMENT N/A 06/10/2015   Procedure: Deep Brain stimulator battery change;  Surgeon: Erline Levine, MD;  Location: [place] NEURO ORS;  Service: Neurosurgery;  Laterality: N/A;   TONSILLECTOMY      Family History  Problem Relation Age of Onset   Aneurysm Mother    Alcoholism Father    HIV/AIDS Brother 31       AIDS   Healthy Child    Peripheral vascular disease Other    Arthritis Other    Healthy  Child    Healthy Child    Diabetes Neg Hx    Heart disease Neg Hx    Colon cancer Neg Hx    Prostate cancer Neg Hx     Social History:  reports that he has never smoked. He has never used smokeless tobacco. He reports current alcohol use. He reports that he does not use drugs.    Review of Systems       HYPERLIPIDEMIA:  He is on Crestor 20 mg with good control of LDL, has low HDL Triglycerides are persistently over 200, treated with Lovaza 2 g twice a day       Lab Results  Component Value Date   CHOL 130 03/12/2018   CHOL 123 04/04/2017   CHOL 111 10/16/2016   Lab Results  Component Value Date   HDL 29.00 (L) 03/12/2018   HDL 32.30 (L) 04/04/2017   HDL 27.80 (L) 10/16/2016   Lab Results  Component Value Date   LDLCALC 118 (H) 09/16/2015   LDLCALC 126 (H) 04/08/2013   LDLCALC 51 02/28/2012   Lab Results  Component Value Date   TRIG 257.0 (H) 03/12/2018   TRIG 232.0 (H) 04/04/2017   TRIG 287.0 (H) 10/16/2016   Lab Results  Component Value Date   CHOLHDL 4 03/12/2018   CHOLHDL 4 04/04/2017   CHOLHDL 4 10/16/2016   Lab Results  Component Value Date   LDLDIRECT 67.0 03/12/2018   LDLDIRECT 61.0 04/04/2017   LDLDIRECT 52.0 10/16/2016                   Thyroid:   He has had hypothyroidism following radioactive iodine treatment for hyperthyroidism in 2013  TSH is  normal lately on 125ug   Lab Results  Component Value Date   TSH 3.51 03/12/2018   TSH 5.68 (H) 11/09/2017   TSH 4.57 (H) 05/17/2017   FREET4 0.75 03/12/2018   FREET4 0.72 11/09/2017   FREET4 0.74 02/05/2017       HYPERTENSION has been followed by  other physicians   CKD: Has variable creatinine levels, recently high.  Does have a nephrologist   Lab Results  Component Value Date   CREATININE 2.31 (H) 06/14/2018   CREATININE 1.63 (H) 03/12/2018   CREATININE 1.44 02/21/2018    Neuropathy: Taking gabapentin. Still complaining of electric sensations throughout his body randomly during the night Is due to see the neurologist  Does feel some numbness in his toes now  Diabetic foot exam in 04/2017 showed  normal monofilament sensation in the toes except decreased on the right great toe and plantar surfaces, no skin lesions or ulcers on the feet and normal pedal pulses    Physical Examination:  BP (!) 108/58 (BP Location: Left Arm, Patient Position: Sitting, Cuff Size: Large)    Pulse 77    Ht '5\' 9"'$  (1.753 m)    Wt 259 lb 3.2 oz (117.6 kg)    SpO2 94%    BMI 38.28 kg/m   Exam not indicated   ASSESSMENT/PLAN  Diabetes type 2, insulin-dependent with obesity     See history of present illness for detailed discussion of his current management, blood sugar patterns and problems identified  His A1c is progressively higher, now 8.5  He is using the Omni pod insulin pump with the older PDM   Most of his hyperglycemia is likely related to poor diet and continued insulin resistance  Although his blood sugars are only averaging 178 at home his A1c likely  indicates higher postprandial readings  He does not monitor readings after meals  Also does not check readings frequently enough at lunch and dinner  Since he is very inactive is also not having good sleep at  night he tends to lack more and not motivated to watch his diet consistently  Likely not adjusting his boluses based on how much he is eating  Discussed importance of frequent blood sugar monitoring including after meals He will adjust his boluses based on what he is eating and go up for higher carbohydrate meals or larger portions If his blood sugars are still going up more than 180 consistently able to change his preset boluses We will increase his basal rate to 1.7 at midnight and this was done in the office  He needs to discuss his other problems with respective physicians including generalized nerve pain  Hypothyroidism: Needs to check his levels on the next visit, currently on levothyroxine 125 mcg  Patient Instructions  Check blood sugars on waking up 4 days a week  Also check blood sugars about 2 hours after meals and do this after different meals by rotation  Recommended blood sugar levels on waking up are 90-130 and about 2 hours after meal is 130-180  Please bring your blood sugar monitor to each visit, thank Henry   BOLUSES for meals:  If Henry are blood sugars are going up more than 50 points after the meal then Henry need to bolus at least 2 units more for that meal  For instance if your sugars going up from 130 all the way to 210 after dinner then Henry will enter 80 carbs instead of 60 at dinner which will give Henry 8 units  Basal rate at midnight will be increased up to 1.7    Counseling time on subjects discussed in assessment and plan sections is over 50% of today's 25 minute visit     Henry [name] 06/17/2018, 3:32 PM   Note: This office note was prepared with Dragon voice recognition system technology. Any transcriptional errors that result from this process are unintentional.

## 2018-06-21 ENCOUNTER — Other Ambulatory Visit: Payer: Self-pay | Admitting: Family Medicine

## 2018-06-21 DIAGNOSIS — M109 Gout, unspecified: Secondary | ICD-10-CM

## 2018-06-24 NOTE — Telephone Encounter (Signed)
Electronic refill request. Allopurinol Last office visit:   01/06/18 Last Filled:   09/23/2017   Electronic refill request. Tramadol Last office visit:   01/06/18 Last Filled:    60 tablet 1 04/24/2018  Please advise.

## 2018-06-25 NOTE — Telephone Encounter (Signed)
Sent. Thanks.   

## 2018-06-28 ENCOUNTER — Encounter: Payer: Self-pay | Admitting: Family Medicine

## 2018-07-01 ENCOUNTER — Other Ambulatory Visit: Payer: Self-pay

## 2018-07-01 ENCOUNTER — Telehealth: Payer: Self-pay | Admitting: Neurology

## 2018-07-01 ENCOUNTER — Other Ambulatory Visit (INDEPENDENT_AMBULATORY_CARE_PROVIDER_SITE_OTHER): Payer: Medicare Other

## 2018-07-01 ENCOUNTER — Other Ambulatory Visit: Payer: Self-pay | Admitting: *Deleted

## 2018-07-01 DIAGNOSIS — N289 Disorder of kidney and ureter, unspecified: Secondary | ICD-10-CM | POA: Diagnosis not present

## 2018-07-01 NOTE — Telephone Encounter (Signed)
Patient requests medication refill:  Tramadol Last office visit:   12/28/2017 Last Filled:    60 tablet 2 06/25/2018  Please advise.

## 2018-07-01 NOTE — Telephone Encounter (Signed)
Called spoke with patient he agrees on CT

## 2018-07-01 NOTE — Telephone Encounter (Signed)
Called spoke with patient he states that he is starting to have some watery sensation running down his right side of face x 2-3 months he says he mention this at last visit.  Pt thinks there is some swelling in face.  Denies, tingling, numbness, pain  Ask patient to contact PCP as well, he states the he would Pt spouse unavailable to talk.

## 2018-07-01 NOTE — Telephone Encounter (Signed)
He has told me this before.  I did a CT of the brain in March because of it and didn't see anything.  His device is not MRI compatible.  The only other thing that we could do is try and contrast that CT and see if we can see anything and do thin cuts through the posterior fossa/trigeminal nerve.  See if they would like to do that.

## 2018-07-01 NOTE — Telephone Encounter (Signed)
Called patient he was made aware of this. Lab orders place Patient aware of lab protocol

## 2018-07-01 NOTE — Telephone Encounter (Signed)
Pt's wife called stating that pt is still having a sensation on his face, he feels like his face swollen but it is not really. Pls call pt with some advise.

## 2018-07-01 NOTE — Telephone Encounter (Signed)
His kidney function was very high for him on 6/5 and wouldn't be able to give the contrast.  Bring him in first today for repeat bun/cr.  Dx:  Renal insufficiency.

## 2018-07-02 ENCOUNTER — Telehealth: Payer: Self-pay

## 2018-07-02 ENCOUNTER — Ambulatory Visit: Payer: Medicare Other | Admitting: Family Medicine

## 2018-07-02 DIAGNOSIS — W19XXXA Unspecified fall, initial encounter: Secondary | ICD-10-CM

## 2018-07-02 DIAGNOSIS — S0990XA Unspecified injury of head, initial encounter: Secondary | ICD-10-CM

## 2018-07-02 DIAGNOSIS — G500 Trigeminal neuralgia: Secondary | ICD-10-CM

## 2018-07-02 LAB — BUN+CREAT
BUN/Creatinine Ratio: 11 (ref 10–24)
BUN: 18 mg/dL (ref 8–27)
Creatinine, Ser: 1.68 mg/dL — ABNORMAL HIGH (ref 0.76–1.27)
GFR calc Af Amer: 48 mL/min/{1.73_m2} — ABNORMAL LOW (ref >59)
GFR calc non Af Amer: 42 mL/min/{1.73_m2} — ABNORMAL LOW (ref >59)

## 2018-07-02 LAB — SPECIMEN STATUS REPORT

## 2018-07-02 NOTE — Telephone Encounter (Signed)
-----   Message from [place], DO sent at 07/02/2018  7:40 AM EDT ----- His Cr looks better.  Okay to order CT with and without contrast with thin cuts through posterior fossa and attention to R trigeminal nerve.  Ask radiologist if can use low dose contrast given kidney function.

## 2018-07-02 NOTE — Telephone Encounter (Signed)
CT ordered Will call patient to make him aware of this As well as lab results

## 2018-07-02 NOTE — Telephone Encounter (Signed)
Wife advised to check with pharmacy and let us know if they do not have the Rx.

## 2018-07-02 NOTE — Telephone Encounter (Signed)
Called spoke with patient and patient spouse they are aware of labs and CT order place

## 2018-07-02 NOTE — Telephone Encounter (Signed)
Please check with pharmacy about this.  I sent this last week.  Thanks.

## 2018-07-08 ENCOUNTER — Telehealth: Payer: Self-pay | Admitting: Neurology

## 2018-07-08 NOTE — Telephone Encounter (Signed)
Patient still has not heard anything from the test place please check the status

## 2018-07-08 NOTE — Telephone Encounter (Signed)
Called spoke with spouse given number to GI to schedule CT they never heard from them to schedule she will call back if she has any trouble scheduling orders was placed

## 2018-07-09 ENCOUNTER — Other Ambulatory Visit: Payer: Self-pay | Admitting: Endocrinology

## 2018-07-18 ENCOUNTER — Ambulatory Visit
Admission: RE | Admit: 2018-07-18 | Discharge: 2018-07-18 | Disposition: A | Discharge status: Home / Self Care | Payer: Medicare Other | Source: Ambulatory Visit | Attending: Neurology | Admitting: Neurology

## 2018-07-18 ENCOUNTER — Other Ambulatory Visit: Payer: Self-pay | Admitting: Neurology

## 2018-07-18 DIAGNOSIS — G500 Trigeminal neuralgia: Secondary | ICD-10-CM

## 2018-07-18 DIAGNOSIS — W19XXXA Unspecified fall, initial encounter: Secondary | ICD-10-CM

## 2018-07-18 DIAGNOSIS — S0990XA Unspecified injury of head, initial encounter: Secondary | ICD-10-CM

## 2018-07-19 ENCOUNTER — Telehealth: Payer: Self-pay

## 2018-07-19 NOTE — Telephone Encounter (Signed)
Called spoke with spouse he decline medication will discuss further at appt next month.

## 2018-07-19 NOTE — Telephone Encounter (Signed)
-----   Message from [place], DO sent at 07/18/2018  4:13 PM EDT ----- Let pt know that CT brain is stable.  They obviously were not able to give him contrast b/c of kidney function

## 2018-07-19 NOTE — Telephone Encounter (Signed)
Called patient no answer left message to call office back for results

## 2018-07-19 NOTE — Telephone Encounter (Signed)
Pt still having trouble with the water sensation running down the right side of face. They would like to know what to do for this now that CT results are back.  Can called spouse at number below [phone]

## 2018-07-19 NOTE — Telephone Encounter (Signed)
He told me in march that he really would not want to add more med for this as it would just be to cover the symptoms.  Has he changed his mind?

## 2018-07-24 ENCOUNTER — Ambulatory Visit (INDEPENDENT_AMBULATORY_CARE_PROVIDER_SITE_OTHER): Payer: Medicare Other | Admitting: *Deleted

## 2018-07-24 DIAGNOSIS — I255 Ischemic cardiomyopathy: Secondary | ICD-10-CM | POA: Diagnosis not present

## 2018-07-24 LAB — CUP PACEART REMOTE DEVICE CHECK
Battery Remaining Longevity: 72 mo
Battery Voltage: 2.98 V
Brady Statistic AP VP Percent: 0 %
Brady Statistic AP VS Percent: 0.51 %
Brady Statistic AS VP Percent: 0.05 %
Brady Statistic AS VS Percent: 99.44 %
Brady Statistic RA Percent Paced: 0.51 %
Brady Statistic RV Percent Paced: 0.05 %
Date Time Interrogation Session: 20200715052405
HighPow Impedance: 72 Ohm
Implantable Lead Implant Date: 20151105
Implantable Lead Implant Date: 20151105
Implantable Lead Location: 753859
Implantable Lead Location: 753860
Implantable Lead Model: 5076
Implantable Pulse Generator Implant Date: 20151105
Lead Channel Impedance Value: 456 Ohm
Lead Channel Impedance Value: 456 Ohm
Lead Channel Impedance Value: 532 Ohm
Lead Channel Pacing Threshold Amplitude: 0.375 V
Lead Channel Pacing Threshold Amplitude: 1 V
Lead Channel Pacing Threshold Pulse Width: 0.4 ms
Lead Channel Pacing Threshold Pulse Width: 0.4 ms
Lead Channel Sensing Intrinsic Amplitude: 13.625 mV
Lead Channel Sensing Intrinsic Amplitude: 13.625 mV
Lead Channel Sensing Intrinsic Amplitude: 2.125 mV
Lead Channel Sensing Intrinsic Amplitude: 2.125 mV
Lead Channel Setting Pacing Amplitude: 2 V
Lead Channel Setting Pacing Amplitude: 2.5 V
Lead Channel Setting Pacing Pulse Width: 0.4 ms
Lead Channel Setting Sensing Sensitivity: 0.3 mV

## 2018-08-02 ENCOUNTER — Encounter: Payer: Self-pay | Admitting: Cardiology

## 2018-08-02 NOTE — Progress Notes (Signed)
Remote ICD transmission.   

## 2018-08-13 NOTE — Progress Notes (Deleted)
Henry [name] was seen today in the movement disorders clinic for neurologic consultation at the request of [name].  His PCP is Tonia Ghent, MD.  The consultation is for the evaluation of PD and to manage his DBS.  He is accompanied by his wife who supplements the history.  The first symptom(s) the patient noticed was right hand tremor in 1999.  He was seen by neurology and was dx with PD.  He was placed on something, but it caused sleepiness.  He does not think that he has ever been on levodopa.   He was placed on Mirapex, which seemed to help.  He only takes it twice per day.  He didn't think it made a difference when he took it tid.  He began to have tremor and it was suggested he do DBS.    The pt is s/p stn DBS in 2005.  He had a battery change in 2009.    10/02/12 update: The patient is accompanied today by his daughter, who supplements the history.  I reviewed medical records available to me since last visit.  The patient had his generator changed on 09/05/2012.  He remains on pramipexole, 0.5 mg twice per day.  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 [name] in that regard.  He is not compliant with CPAP so says that he doesn't want to return to her for f/u. He doesn't take the klonopin faithfully.  10/11/12 update:  Pt was seen as a walk in/work in today.  Pt had increasing tremor, L greater than R for 3 days.  Thinks that it started with the d/c of artane.  Speech stable.  11/19/12 update:  Pt is seen today for his PD, accompanied by his daughter who supplements the hx.    He is currently on klonopin 0.5 mg - 1/2-1 tablet q hs.  He only takes it when his wife is not working.  She works nights and is only in 2 days per week.  He has some reluctance to take it other nights.  He is on pramipexole 0.5 mg bid.  Last visit, his DBS was reset more similar to the settings he had prior to coming here, just with an increased voltage.  About 2 weeks after our last  visit, the patient decided to go back on the Artane.  The combination of the Artane and the DBS changes helped significantly.  He does ask if I can slightly increased voltage on the left hand, as he has some tremor at night that is bothersome.  Otherwise, he is doing well.  He feels that his balance has been great.  02/19/13 update:  This patient is accompanied in the office by his child who supplements the history.  The pt has a hx of PD.  He has been tremor free and is very happy about that.  He has had some increased balance loss; the last fall was a few months ago but he hasn't gotten hurt.  Was considering hernia surgery.  The records that were made available to me were reviewed.  He is holding on that for now but plans to have it done before end of year.  No hallucinations.  He is still having some acting out of the dreams, despite clonazepam.  06/19/13 update:  Patient is returning to followup regarding his Parkinson's disease.  I had the opportunity to review records since last visit.  He went to the emergency room on  05/12/2013 with chest pain and shortness of breath.  He had been fishing and had missed several doses of Lasix.  He had been eating seafood.  He ended up with an acute exacerbation of his chronic congestive heart failure.  Once he was diuresed and back on his medications, he is feeling better.  He just had a nuclear medicine study done on 06/17/2013 and the ejection fraction on this looked better than on his echocardiogram.  The left ventricular ejection fraction was 41%.  Prior to that, there was some concern that his left ventricular ejection fraction had dropped so much that he may need an ICD.  In terms of Parkinson's disease, the patient states that he has been doing very well in terms of tremor, but his walking really has deteriorated.  He describes a festinating gait, with much more shuffling.  He fell walking over his dog gate but otherwise has not had falls.  He went fishing  yesterday and states that he "stumbled all day long."  No hallucinations.  He has been trying to be more faithful with his CPAP. He remains on Mirapex 0.5 mg twice a day, Artane 2 mg twice a day.  10/23/13 update:  The patient returns today to the clinic, accompanied by his wife who supplements the history.  From a Parkinson's standpoint, the patient states that he has been doing very well.  No tremor.  He started on levodopa last visit and states that he has had no falls and overall stumbling has been much better.  He states that the only time that he stumbles is if he is inside of his fishing boat, and states that that is really because it is small and unstable.  He is taking his levodopa in the morning, after lunch and bedtime.  He has had no hallucinations.  His wife states that he is still draining and acting out the dreams and even fell out of bed a few days ago.  However, he is taking his clonazepam about 10 PM but doesn't go to bed until 1 AM.  He is not using his CPAP.  Is having significant constipation.   I reviewed his cardiology records since last visit.  He did have a heart catheterization and is scheduled to have an ICD placed on November 5.  02/23/14 update:  Pt returns for follow up accompanied by his wife who supplements the history.  The records that were made available to me were reviewed since last visit.  He had an ICD placed on 11/15/13.  He is doing well.  Taking carbidopa/levodopa 25/100 three times a day and remains on artane as well as mirapex 0.5 mg bid.  Had a fall up the stairs.  Wife describes festinating gate and pt thinks that levodopa contributes.  He also doesn't feel good much of the time and thinks that is from levodopa.  Admits, however, that when BS under good control, he feels good.  Has started seeing endocrinology and started on new meds.  He has klonopin for RBD.  Still vivid dreams but no falling out bed (in recliner now).  Having some word finding trouble and memory  doesn't seem clear.  Also, put back tailgate down on car and came down on generator and wants me to look at that.    07/01/14 update:  The patient follows up today regarding his Parkinsons disease.  He is accompanied by his wife who supplements the history.  Last visit, the patient was complaining about memory change which  I thought was likely multifactorial and due to medication such as Artane, Mirapex and Ultram, which he was taking every 3 hours.  The patient, however, thought that it was from levodopa and so we held it and the patient reported that he felt better and therefore continued to stay off of the medication.  He reports that falls are better after d/c levodopa but wife reports still falls.  Pt states that he only fell in shower after he d/c levodopa.  His wife does admit that she thinks that levodopa was causing loss of balance.  He is having more tremor over the last month on the L hand.   He remains on clonazepam 0.5 mg, 1-1/2 tablets at night for REM behavior disorder.  I reviewed records since last visit.  He has seen Dr. Dwyane Dee multiple times in regards to his uncontrolled diabetes.  He started on insulin on May 22 2014.  He states that he is doing well with injecting himself.  Having a lot of constipation.  Has the rancho recipe.    11/03/14 update:  The patient is following up today, accompanied by his wife who supplements the history.  Records were reviewed since our last visit.  The patient is on pramipexole 0.5 mg twice a day and Artane 2 mg twice a day.  He has not had any hallucinations.  Had a fall the other day walking up stairs carrying something and fell backward.  Hit his back.  No LOC.  Also fell backward in the summer around the pool.  [place] one time out of his boat.  Doesn't want to use a walker. He was in the emergency room recently with a rash that was felt secondary to insect bites that he got in the woods.  He also recently had a colonoscopy.  His diabetes is under better  control and his last A1c was 7.2.  States that he now has an insulin pump.  He remains on clonazepam 0.5 mg, 1-1/2 tablets at night for REM behavior disorder.  More back pain lately and will start injections for the back which have helped previously  02/04/15 update:  The patient presents today, accompanied by his wife who supplements the history.  I have reviewed prior records made available to me.  He has established a new primary care doctor in Dr. Damita Dunnings.  The patient remains on Artane, 2 mg twice a day as well as pramipexole 0.5 mg twice a day.  Overall, he has been doing fairly well.  He occasionally still has some tremor.  He had 2 falls since our last visit; he states that he was pushing a gate open and it came back and hit him.  He was also carrying wood the other day and fell forward with wood.  No hallucinations.  No lightheadedness or near syncope.  He remains on clonazepam 0.5 mg, 1-1/2 tablets at night for REM behavior disorder.  He is doing [facility] at [facility].    06/08/15 update:  This patient is accompanied in the office by his spouse who supplements the history.  The patient remains on Artane, 2 mg twice a day as well as pramipexole 0.5 mg twice a day.  He tripped over the curb at [employer].  He tripped over the step on his deck.   He remains on clonazepam 0.5 mg, 1-1/2 tablets at night for REM behavior disorder.  He is still having trouble with that.  He fell out of bed the other night.  Wife asks about bed rails.   He felt that [facility] helped.  Is looking forward to getting in his pool.    09/09/15 update:  The patient follows up today, accompanied by his wife who supplements the history.  He is on pramipexole 0.5 g twice a day and trihexyphenidyl 2 mg twice a day.  Didn't go up to the 1.5 mg of pramipexole.   He is on clonazepam 0.5 mg, 1-1/2 tablets at night for REM behavior disorder.  His biggest issue is RLS at bedtime.  It is preventing him from going to sleep.    He denies any hallucinations.  He denies cognitive change.  He continues to struggle with balance and has fallen/stumbled a few times.  He has fallen in the boat.  Wife describes festinating gait.  Wife also describes crying frequently.  Denies depression. He has his battery changed on 06/10/15  12/18/15 update:  Pt is following up today, accompanied by his wife who supplements the history.  Last visit, I changed him from pramipexole, 0.5 mg twice per day to Mirapex ER, 1.5 mg once per day.  He states that it is very expensive.  He remains on trihexyphenidyl, 2 mg twice per day.  He is also on clonazepam 0.5 mg, 1-1/2 tablets at night for REM behavior disorder. It turns out he was taking this at 6pm.   I reviewed records since last visit.  He reports that his pharmacist to come off his Aldactone because of an interaction with his Parkinson's medicine.  I am unaware of any interaction with his current medications.  Been having issues with constipation, which has been treated by Dr. Damita Dunnings.  [facility] is in [facility].  When asked about falls, his wife states that he falls all the time but he is more conscious of festinating gait now that he is in PT.  Not gotten hurt with falls.    07/13/16 update:  Pt f/u today for PD, accompanied by his wife, who supplements the history.  The records that were made available to me were reviewed since last visit.  On pramipexole ER 1.5 mg daily and artane 2 mg bid.  Was on klonopin for RBD but he d/c the medication.  He thought that it caused insomnia.  He is still acting out the dreams.  He won't use his CPAP machine.  States that he is afraid of it malfunctioning.  Often will sleep in the recliner.  Having more back pain.  Having injections and 3rd is next week at [facility].    Wearing off:  No.  How long before next dose:  n/a Falls:   Yes.  , 12 months but some of these were falls out of bed in RBD N/V:  No. Hallucinations:  Yes.   (none visual but few auditory and has  gotten out of bed to see who was there)  visual distortions: No. Lightheaded:  No.  Syncope: No. Dyskinesia:  No.   12/15/16 update: Patient seen today in follow-up for his Parkinson's disease.  He is accompanied by his wife who supplements the history.  He remains on pramipexole ER, 1.5 mg daily.  He is also on trihexyphenidyl, 2 mg.  He was taking bid but he looked at his bottle and realized he was supposed to be taking it qd and just started doing that.  He has not had any hallucinations, except a few arising in the middle of the night out of a vivid dream.  No compulsive  behaviors.  I have reviewed records made available to me from his other providers, including his endocrinologist, cardiologist and primary care physician.  Off of klonopin.  "If I take it I sleep the entire next day."  He is having vivid dreams.  States that he has not fallen that much until last night.  Wife describes festinating gait.  Having more trouble in the shower.  A shower chair will not fit in his shower.  They have a small corner seat, but he states that he cannot wash his feet if he sits in it.  It is too slippery.  He is a currently attending [facility] therapy through [facility] for his low back pain.  He does like that.  04/20/17 update: Patient is seen today in follow-up for his Parkinson's disease.  Patient is accompanied by his wife who supplements the history.  Patient is on pramipexole, 1.5 mg daily.  He is also on Artane, 2 mg daily.  No hallucinations.  No lightheadedness or near syncope.  I have talked to his primary care physician about him.  Primary care wanted to get an MRI of his brain due to hearing loss. 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  Discussed with his primary care physician that if he needed MRI, his device is MRI compatible for head MRI, but would need [employer] rep there to turn off the device and make sure  impedances were normal the day of MRI. Has new hearing aid as of today and doing well with that.   [place] today and tried to get off of toilet and hit door knob and fell.  Still going fishing and friend helps him navigate the water.  Went yesterday. Having more renal dysfunction and urinary urgency and dysuria.  No UTI per urology and placed on myrbetriq.  Has referral to nephrology but has not heard from them yet.  06/29/17 update: Patient is seen today in follow-up for Parkinson's.  We tried to discontinue his trihexyphenidyl, but they felt that his walking got worse after discontinuing the medication.  It was restarted, but I told them to trial off of it again as trihexyphenidyl generally has a negative effect on walking and not positive.  He ended up staying on the medication.  Overall, they do think that balance has been worse as has been acting out of the dreams.  He has had 2-3 falls.  He is having freezing.  Battery site sore since fall.   He describes hallucinations but they arise out of the sleep.  He will start dreaming and it feels real.  He states that it goes away when wakes up.  Wife isn't so sure.  He is tried clonazepam but that it made him too sleepy.  wife states that she restarted klonopin a week ago, 0.5 mg, and wife thinks that it has helped sleep but still having a bit of that.  He is still on pramipexole ER, 1.5 mg daily.    07/23/17 update: Patient is seen today in follow-up for Parkinson's.  His device was adjusted last visit and I also had him start back on carbidopa/levodopa 25/100 and work to 1 tablet 3 times per day.  His pramipexole was decreased to 0.25 mg tid.  Today, he reports that he is feeling more stable with this change in meds.  He is back on klonopin and sleeping well.  He  fell in the kitchen.  He was near the stove and went to reach for the refrigerator and fell and glasses cut his face.  He had his walker near him but wasn't using it because "I didn't need it."     He is  having trouble remembering middle of the day dosing but generally takes med at 8am/2pm/8pm (didn't take 2 pm today and seen at 3pm)  10/25/17 update: Patient is seen today in follow-up for Parkinson's disease, accompanied by his wife who supplements history.  He is supposed to be on carbidopa/levodopa 25/100, 2/2/1 but is still only on carbidopa/levodopa 25/100 tid.  He reports that he started on the correct dosage initially but had old bottle and was running out early. Wife states that he thinks she is trying to harm him when given proper dose since old directions were on the bottle.   He then tells me "it makes me too dizzy."    He is off of pramipexole and artane.  Multiple falls but falling always without the walker.  Has hit head hard with fall onto oak cabinet and worries because tremor worse after that.   More cramping in the feet at night.    02/11/18 update: Patient is seen today in follow-up for Parkinson's disease, accompanied by his wife who supplements the history.  I discontinued his immediate release levodopa last visit because of dizziness.  I started him on carbidopa/levodopa 25/100 CR and told him to take 2 tablets at 7 AM, 2 tablets at noon, 1 tablet at 4 PM and 1 tablet at bedtime, in hopes that it would help the nighttime cramping.  He states that it helped but he also got some shot in his foot that has helped.  He rubs some cream on it that he thinks helps.  Last visit, I encouraged him to get a U step walker given the falls.  He is using a walker some at home but not always.   He still falls.  He actually pulled his kitchen cabinet out of the wall due to fall.  He is exercising at home.  He is walking straight ahead better now that he is exercising but has trouble in the turns.   His DBS battery was changed on November 13, 2017.  He thinks that he is shaking more since the battery change - L leg and R arm.   Records have been reviewed.  He has seen his primary care physician about shoulder  pain.  He has been following with Dr. Dwyane Dee regarding his diabetes.  tells me about facial paresthesias - "I feel like i've been having on my glasses when I don't."   Involves the face/cheek/forehead on the right.  First says it started a year ago but then wife states that it was a few months ago.  Had a CT after our last visit and isn't sure if had the sx then.    03/20/18 update: Patient seen today as a work in appointment at the request of his endocrinologist.  He was seen there on March 6 and apparently was complaining about pulling in his face.  Patient reports that it has been going on for months.   There is no pain.  There is no visible pulling of the face.  It seems to be worse at night when he lays down.  When he came in last visit, he was complaining about some facial paresthesias on the right and today states that really this is the same thing  that he is talking about when he described pulling to Dr. Dwyane Dee.  It is mainly on the right side but is some on the L.   He states today it is in the R temple to the R jaw.  It seems to come and go but worse over the last month.  Feels a bit like it is "numb but not numb."   Admits that he has fallen and hit his head since our last visit.   He is on carbidopa/levodopa 25/100 CR, 2 tablets at 7 AM, 2 tablets at noon, 1 tablet at 4 PM and 1 tablet at bedtime.  He is in [facility] and is liking that.  He does complain about some drooling.  08/15/18 update: Patient seen today in follow-up for Parkinson's disease.  He is on carbidopa/levodopa 25/100 CR, 2 tablets at 7 AM, 2 tablets at noon, 1 tablet at 4 PM and 1 tablet at bedtime.  Since our last visit, he has called regarding the paresthesias in his face.  He has mentioned this at several previous visits, but he is unable to have an MRI of the brain.  We tried to get a contrasted CT, but this was not possible because of his kidney function.  A repeat CT of the brain did not demonstrate any structural lesion.  Medication was  offered, but the patient declined.  PREVIOUS MEDICATIONS: Mirapex and artane, levodopa (made "too loose"); clonazepam (overly sleepy)  ALLERGIES:   Allergies  Allergen Reactions   Penicillins Rash and Other (See Comments)    WEARS ALLERGY BRACELET Because of a history of documented adverse serious drug reaction;Medi Alert bracelet  is recommended PATIENT HAS HAD A PCN REACTION WITH IMMEDIATE RASH, FACIAL/TONGUE/THROAT SWELLING, SOB, OR LIGHTHEADEDNESS WITH HYPOTENSION:  #  #  YES  #  #  Has patient had a PCN reaction causing severe rash involving mucus membranes or skin necrosis: unknown Has patient had a PCN reaction that required hospitalization NO Has patient had a PCN reaction occurring within the last 10 years: NO   Klonopin [Clonazepam] Other (See Comments)    agitation   Peanut-Containing Drug Products Cough   Watermelon [Citrullus Vulgaris] Other (See Comments) and Cough    Tickle in throat    CURRENT MEDICATIONS:   Allergies as of 08/15/2018      Reactions   Penicillins Rash, Other (See Comments)   WEARS ALLERGY BRACELET Because of a history of documented adverse serious drug reaction;Medi Alert bracelet  is recommended PATIENT HAS HAD A PCN REACTION WITH IMMEDIATE RASH, FACIAL/TONGUE/THROAT SWELLING, SOB, OR LIGHTHEADEDNESS WITH HYPOTENSION:  #  #  YES  #  #  Has patient had a PCN reaction causing severe rash involving mucus membranes or skin necrosis: unknown Has patient had a PCN reaction that required hospitalization NO Has patient had a PCN reaction occurring within the last 10 years: NO   Klonopin [clonazepam] Other (See Comments)   agitation   Peanut-containing Drug Products Cough   Watermelon [citrullus Vulgaris] Other (See Comments), Cough   Tickle in throat      Medication List       Accurate as of August 13, 2018  4:56 PM. If you have any questions, ask your nurse or doctor.        acetaminophen 500 MG tablet Commonly known as: TYLENOL Take 500 mg  by mouth 2 (two) times daily as needed for moderate pain or headache.   albuterol 108 (90 Base) MCG/ACT inhaler Commonly known as: VENTOLIN HFA  Inhale 2 puffs into the lungs every 6 (six) hours as needed for wheezing or shortness of breath.   allopurinol 100 MG tablet Commonly known as: ZYLOPRIM Take 1.5 tablets (150 mg total) by mouth daily.   AMBULATORY NON FORMULARY MEDICATION Ustep walker DX: G20   aspirin EC 81 MG tablet Take 81 mg by mouth daily.   Carbidopa-Levodopa ER 25-100 MG tablet controlled release Commonly known as: SINEMET CR TAKE 2 TABS AT 7 AM, THEN 2 TABS AT 12PM, THEN 1 TABS AT 4PM, 1 TAB AT BEDTIME   cholecalciferol 1000 units tablet Commonly known as: VITAMIN D Take 1,000 Units by mouth daily.   colchicine 0.6 MG tablet TAKE 1 TABLET BY MOUTH TWICE A DAY AS NEEDED FOR GOUT   ferrous sulfate 325 (65 FE) MG EC tablet Take 325 mg by mouth daily.   furosemide 80 MG tablet Commonly known as: LASIX TAKE 1 & 1/2 TABLETS DAILY   gabapentin 100 MG capsule Commonly known as: NEURONTIN Take 200 mg by mouth in the morning and take 400 mg by mouth at bedtime.  6 per day.   glucose blood test strip Commonly known as: OneTouch Verio USE AS DIRECTED TO CHECK BLOOD SUGAR TWICE A DAY. DX:E11.65   glucose blood test strip 1 each by Other route as needed for other. Use as instructed to check blood sugar 4 times daily.   HumaLOG 100 UNIT/ML injection Generic drug: insulin lispro USE MAXIMUM 76 UNITS PER   DAY WITH V-GO PUMP   Klor-Con M20 20 MEQ tablet Generic drug: potassium chloride SA TAKE 2 TABLETS (40 MEQ TOTAL) BY MOUTH DAILY.   levothyroxine 125 MCG tablet Commonly known as: SYNTHROID TAKE 1 TABLET BY MOUTH EVERY DAY BEFORE BREAKFAST   liraglutide 18 MG/3ML Sopn Commonly known as: Victoza Inject 0.3 mLs (1.8 mg total) into the skin daily.   lisinopril 2.5 MG tablet Commonly known as: ZESTRIL Take 1 tablet (2.5 mg total) by mouth daily.     metolazone 2.5 MG tablet Commonly known as: ZAROXOLYN TAKE 1 TABLET BY MOUTH EVERY DAY AS NEEDED FOR > 3LB IN A DAY OR > 5LB IN A WEEK   metoprolol succinate 50 MG 24 hr tablet Commonly known as: TOPROL-XL TAKE 1 TABLET BY MOUTH DAILY. TAKE WITH OR IMMEDIATELY FOLLOWING A MEAL.   multivitamin with minerals Tabs tablet Take 1 tablet by mouth daily.   nitroGLYCERIN 0.4 MG SL tablet Commonly known as: NITROSTAT Place 1 tablet (0.4 mg total) under the tongue every 5 (five) minutes as needed for chest pain.   OmniPod Dash System Kit 1 each by Does not apply route. Use Dash system for Continuous Blood Glucose Monitoring.   OVER THE COUNTER MEDICATION Apply 1 application topically at bedtime. Theraworx Pain Cream   polyethylene glycol 17 g packet Commonly known as: MIRALAX / GLYCOLAX Take 17 g by mouth daily as needed for moderate constipation.   rosuvastatin 20 MG tablet Commonly known as: CRESTOR TAKE 1 TABLET BY MOUTH EVERY DAY   traMADol 50 MG tablet Commonly known as: ULTRAM TAKE 1 TABLET BY MOUTH 2 TIMES DAILY AS NEEDED FOR MODERATE PAIN.   travoprost (benzalkonium) 0.004 % ophthalmic solution Commonly known as: TRAVATAN Place 1 drop into the right eye at bedtime.   vitamin B-12 1000 MCG tablet Commonly known as: CYANOCOBALAMIN Take 1,000 mcg by mouth daily.   Wixela Inhub 250-50 MCG/DOSE Aepb Generic drug: Fluticasone-Salmeterol INHALE 1 PUFF INTO THE LUNGS 2 TIMES DAILY.  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)   DM (diabetes mellitus) ([place])    TYPE 2    GERD  (gastroesophageal reflux disease)    Glaucoma    right eye   HLD (hyperlipidemia)    HTN (hypertension)    pt denies 08/19/12   Hyperplasia, prostate    Hyperthyroidism    thyroid lobectomy   MI (myocardial infarction) ([place])    Dr Stanford Breed 2000, x3vessels bypass   Nephrolithiasis    OSA (obstructive sleep apnea)    AHI-28,on CPAP, noncompliant with CPAP   Parkinson disease ([place])    1999   PONV (postoperative nausea and vomiting)    Restless legs    Shortness of breath    Hx: of at all times   Sleep apnea    does not use CPAP   Sleep apnea, organic    UTI (lower urinary tract infection) 09/15/12   Klebsiella   Ventral hernia     PAST SURGICAL HISTORY:   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   CARDIAC CATHETERIZATION     CATARACT EXTRACTION W/ INTRAOCULAR LENS IMPLANT     Hx: of right eye   CATARACT EXTRACTION W/ INTRAOCULAR LENS IMPLANT Left 2018   COLONOSCOPY N/A 10/13/2014   Procedure: COLONOSCOPY;  Surgeon: Jerene Bears, MD;  Location: [facility];  Service: Gastroenterology;  Laterality: N/A;   COLONOSCOPY W/ BIOPSIES AND POLYPECTOMY     Hx: of   CORONARY ARTERY BYPASS GRAFT  2000   [name], MD   CORONARY STENT PLACEMENT  1998   DEEP BRAIN STIMULATOR PLACEMENT  2004   Right and left VIN stimulator placement (parkinsons)   EYE SURGERY     FINGER AMPUTATION     left pointer   IMPLANTABLE CARDIOVERTER DEFIBRILLATOR IMPLANT N/A 11/13/2013   Procedure: IMPLANTABLE CARDIOVERTER DEFIBRILLATOR IMPLANT;  Surgeon: Evans Lance, MD;  Location: [facility] CATH LAB;  Service: Cardiovascular;  Laterality: N/A;   INSERT / REPLACE / REMOVE PACEMAKER     LEFT AND RIGHT HEART CATHETERIZATION WITH CORONARY ANGIOGRAM N/A 09/24/2013   Procedure: LEFT AND RIGHT HEART CATHETERIZATION WITH CORONARY ANGIOGRAM;  Surgeon: Burnell Blanks, MD;  Location: [facility] CATH LAB;  Service: Cardiovascular;   Laterality: N/A;   LITHOTRIPSY     3 different times   MEDIAN STERNOTOMY  2000   PULSE GENERATOR IMPLANT Right 11/13/2017   Procedure: Right chest implantable pulse generator change;  Surgeon: Erline Levine, MD;  Location: [place];  Service: Neurosurgery;  Laterality: Right;  Right chest implantable pulse generator change   SUBTHALAMIC STIMULATOR BATTERY REPLACEMENT N/A 09/05/2012   Procedure: Deep brain stimulator battery change;  Surgeon: Erline Levine, MD;  Location: [place] [facility];  Service: Neurosurgery;  Laterality: N/A;  Deep brain stimulator battery change   SUBTHALAMIC STIMULATOR BATTERY REPLACEMENT N/A 06/10/2015   Procedure: Deep Brain stimulator battery change;  Surgeon: Erline Levine, MD;  Location: [place] [facility];  Service: Neurosurgery;  Laterality: N/A;   TONSILLECTOMY      SOCIAL HISTORY:   Social History   Socioeconomic History   Marital status: Married    Spouse name: [name]   Number of children: 2   Years of education: Not on file   Highest education level: Not on file  Occupational History   Occupation: DISABLED    Comment: [employer], [place] resource strain: Not on file   Food insecurity    Worry: Not on file    Inability: Not on file   Transportation needs    Medical: Not on file    Non-medical: Not on file  Tobacco Use   Smoking status: Never Smoker   Smokeless tobacco: Never Used  Substance and Sexual Activity   Alcohol use: Yes    Comment: occasional beer   Drug use: No   Sexual activity: Not on file  Lifestyle   Physical activity    Days per week: Not on file    Minutes per session: Not on file   Stress: Not on file  Relationships   Social connections    Talks on phone: Not on file    Gets together: Not on file    Attends religious service: Not on file    Active member of club or organization: Not on file    Attends meetings of clubs or organizations: Not on file    Relationship status: Not on  file   Intimate partner violence    Fear of current or ex partner: Not on file    Emotionally abused: Not on file    Physically abused: Not on file    Forced sexual activity: Not on file  Other Topics Concern   Not on file  Social History Narrative   From [facility]   Retired/disability Clinical research associate   Likes to fish.     Married 1972   3 kids   [place] fan    FAMILY HISTORY:   Family Status  Relation Name Status   Mother  Deceased       complications of surgery   Father  Deceased   Brother  Alive       1 brother, 36 half brother   Sister  Alive       2 half sisters   Child  Alive   Other  (Not Specified)   MGM  Deceased   MGF  Deceased   PGM  Deceased   PGF  Deceased   Sister  Alive   Brother  Alive   Child  Alive   Child  Alive   Neg Hx  (Not Specified)    ROS:  ROS   PHYSICAL EXAMINATION:    VITALS:   There were no vitals filed for this visit. GEN:  The patient appears stated age and is in NAD. HEENT:  Normocephalic, atraumatic.  The mucous membranes are moist. The superficial temporal arteries are without ropiness or tenderness. CV:  RRR Lungs:  CTAB Neck/HEME:  There are no carotid bruits bilaterally. Skin: The skin/incision site is well-healed over the new battery site on the right chest.  However, the wire is visible underneath the skin and is quite tight, but the patient has full range of motion of the neck.  Neurological examination:  Orientation: The patient is alert and oriented x3. Cranial nerves: There is good facial symmetry.  Nasolabial fold is symmetric.  He is able to hold air tightly in the  cheeks.  He is able to raise the forehead symmetrically.  He is able to squeeze the eyes tightly shut.  The speech is fluent and dysarthric than previous visits. Soft palate rises symmetrically and there is no tongue deviation. Hearing is intact to conversational tone. Sensation: Sensation is intact to light touch  throughout Motor: Strength is at least antigravity x4.   Shoulder shrug is equal and symmetric.  There is no pronator drift.  Movement examination: Tone: There is normal tone in the upper and lower extremities Abnormal movements: no tremor noted today (except when device off) Coordination:  There is  decremation with RAM's, mostly in the foot on the left. Gait and Station: The patient pushes off the chair.  He has his cane today and is ataxic some.  He festinates.    LABS  Lab Results  Component Value Date   WBC 7.5 11/08/2017   HGB 12.5 (L) 11/08/2017   HCT 40.3 11/08/2017   MCV 88.0 11/08/2017   PLT 198 11/08/2017   Lab Results  Component Value Date   TSH 3.51 03/12/2018     Chemistry      Component Value Date/Time   NA 135 06/14/2018 0814   NA 142 12/22/2016 1233   K 3.6 06/14/2018 0814   CL 91 (L) 06/14/2018 0814   CO2 34 (H) 06/14/2018 0814   BUN 18 07/01/2018 0000   CREATININE 1.68 (H) 07/01/2018 0000   CREATININE 2.13 06/25/2017   CREATININE 1.53 (H) 05/22/2016 1106      Component Value Date/Time   CALCIUM 9.1 06/14/2018 0814   ALKPHOS 105 06/14/2018 0814   AST 8 06/14/2018 0814   ALT 7 06/14/2018 0814   BILITOT 0.5 06/14/2018 0814     Lab Results  Component Value Date   VITAMINB12 956 (H) 02/22/2017   Lab Results  Component Value Date   HGBA1C 8.5 (H) 06/14/2018   DBS programming was performed today, which is described in more detail on a separate programming procedural notes.  In brief, the device was turned off today independently on the right and left.  This did not change the paresthesias on the face.  ASSESSMENT/PLAN:  1.  Idiopathic Parkinson's disease.    -Now off trihexyphenidyl and pramipexole  -His DBS battery was last changed on 09/05/2012 and on 06/10/15 and November 13, 2017.  Encouraged the patient to move around the neck to avoid further scarring around the wire on the right.  -use walker at all times  -Continue carbidopa/levodopa  25/100 CR, 2 tablets at 7am, 2 at noon, 1 at 4pm, 1 at bedtime.  Hopeful that this will help the cramping. 2.  RBD  -He has tried clonazepam several times and ultimately ends up stopping it because of side effect.  Could try Rozerem in the future if insurance would pay.  Decided to hold off for now as the patient states that while dreaming is vivid, it is not particularly bothersome and he is not falling out of the bed 3.  Pseudobulbar affect (crying)  -Wants no medication for that. 4.  OSAS, noncompliant with CPAP.  -Unfortunately, he has been unable to tolerate CPAP.  Talked about dental appliances, which he does not wish to try either. 5.  CHF  -ICD placed on 11/15/2013 6.  Facial paresthesias  -He has had several negative CTs of the brain.  Contrast was unable to be administered because of kidney function.  His device is not MRI compatible.  -I wonder if this is not  a form of trigeminal neuralgia.  ***We discussed Trileptal in detail.  Ultimately, he states that he really does not want further medication.  He will let me know if he changes his mind.   7.  Sialorrhea  -This is commonly associated with PD.  We talked about treatments.  The patient is not a candidate for oral anticholinergic therapy because of increased risk of confusion and falls.  We discussed Botox (type A and B) and 1% atropine drops.  We discusssed that candy like lemon drops can help by stimulating mm of the oropharynx to induce swallowing.  He does not want any treatment right now. 8.  Follow-up at his previously scheduled visit.  Much greater than 50% of this visit was spent in counseling and coordinating care.  Total face to face time:  25 min

## 2018-08-14 DIAGNOSIS — N183 Chronic kidney disease, stage 3 (moderate): Secondary | ICD-10-CM | POA: Diagnosis not present

## 2018-08-14 DIAGNOSIS — N2581 Secondary hyperparathyroidism of renal origin: Secondary | ICD-10-CM | POA: Diagnosis not present

## 2018-08-14 DIAGNOSIS — N189 Chronic kidney disease, unspecified: Secondary | ICD-10-CM | POA: Diagnosis not present

## 2018-08-14 DIAGNOSIS — I129 Hypertensive chronic kidney disease with stage 1 through stage 4 chronic kidney disease, or unspecified chronic kidney disease: Secondary | ICD-10-CM | POA: Diagnosis not present

## 2018-08-14 DIAGNOSIS — D631 Anemia in chronic kidney disease: Secondary | ICD-10-CM | POA: Diagnosis not present

## 2018-08-14 DIAGNOSIS — I509 Heart failure, unspecified: Secondary | ICD-10-CM | POA: Diagnosis not present

## 2018-08-15 ENCOUNTER — Ambulatory Visit: Payer: Medicare Other | Admitting: Neurology

## 2018-08-15 ENCOUNTER — Encounter: Payer: Self-pay | Admitting: Neurology

## 2018-08-15 ENCOUNTER — Encounter: Payer: Self-pay | Admitting: Family Medicine

## 2018-08-15 ENCOUNTER — Other Ambulatory Visit: Payer: Self-pay | Admitting: Family Medicine

## 2018-08-15 ENCOUNTER — Encounter

## 2018-08-15 DIAGNOSIS — IMO0002 Reserved for concepts with insufficient information to code with codable children: Secondary | ICD-10-CM | POA: Insufficient documentation

## 2018-08-15 DIAGNOSIS — M109 Gout, unspecified: Secondary | ICD-10-CM

## 2018-08-15 DIAGNOSIS — Z029 Encounter for administrative examinations, unspecified: Secondary | ICD-10-CM

## 2018-08-15 DIAGNOSIS — E1165 Type 2 diabetes mellitus with hyperglycemia: Secondary | ICD-10-CM | POA: Insufficient documentation

## 2018-08-15 DIAGNOSIS — E119 Type 2 diabetes mellitus without complications: Secondary | ICD-10-CM | POA: Insufficient documentation

## 2018-08-15 NOTE — Telephone Encounter (Signed)
Sent. Thanks.   

## 2018-08-15 NOTE — Telephone Encounter (Signed)
Last refilled on 03/01/2018 #540 with 1 refill. LOV 12/28/2017. Future appointment on 08/23/2018

## 2018-08-16 ENCOUNTER — Ambulatory Visit (INDEPENDENT_AMBULATORY_CARE_PROVIDER_SITE_OTHER): Payer: Medicare Other

## 2018-08-16 ENCOUNTER — Other Ambulatory Visit (INDEPENDENT_AMBULATORY_CARE_PROVIDER_SITE_OTHER): Payer: Medicare Other

## 2018-08-16 VITALS — Ht 69.0 in | Wt 258.0 lb

## 2018-08-16 DIAGNOSIS — M109 Gout, unspecified: Secondary | ICD-10-CM | POA: Diagnosis not present

## 2018-08-16 DIAGNOSIS — E890 Postprocedural hypothyroidism: Secondary | ICD-10-CM

## 2018-08-16 DIAGNOSIS — E1165 Type 2 diabetes mellitus with hyperglycemia: Secondary | ICD-10-CM

## 2018-08-16 DIAGNOSIS — Z0000 Encounter for general adult medical examination without abnormal findings: Secondary | ICD-10-CM | POA: Diagnosis not present

## 2018-08-16 DIAGNOSIS — Z794 Long term (current) use of insulin: Secondary | ICD-10-CM

## 2018-08-16 LAB — COMPREHENSIVE METABOLIC PANEL
ALT: 8 U/L (ref 0–53)
AST: 8 U/L (ref 0–37)
Albumin: 4.4 g/dL (ref 3.5–5.2)
Alkaline Phosphatase: 104 U/L (ref 39–117)
BUN: 31 mg/dL — ABNORMAL HIGH (ref 6–23)
CO2: 37 mEq/L — ABNORMAL HIGH (ref 19–32)
Calcium: 9.6 mg/dL (ref 8.4–10.5)
Chloride: 93 mEq/L — ABNORMAL LOW (ref 96–112)
Creatinine, Ser: 1.94 mg/dL — ABNORMAL HIGH (ref 0.40–1.50)
GFR: 34.69 mL/min — ABNORMAL LOW (ref >60.00)
Glucose, Bld: 241 mg/dL — ABNORMAL HIGH (ref 70–99)
Potassium: 3.7 mEq/L (ref 3.5–5.1)
Sodium: 139 mEq/L (ref 135–145)
Total Bilirubin: 0.5 mg/dL (ref 0.2–1.2)
Total Protein: 6.7 g/dL (ref 6.0–8.3)

## 2018-08-16 LAB — T4, FREE: Free T4: 0.88 ng/dL (ref 0.60–1.60)

## 2018-08-16 LAB — TSH: TSH: 2.64 u[IU]/mL (ref 0.35–4.50)

## 2018-08-16 LAB — URIC ACID: Uric Acid, Serum: 8.6 mg/dL — ABNORMAL HIGH (ref 4.0–7.8)

## 2018-08-16 LAB — HEMOGLOBIN A1C: Hgb A1c MFr Bld: 8.3 % — ABNORMAL HIGH (ref 4.6–6.5)

## 2018-08-16 NOTE — Progress Notes (Signed)
Subjective:   Henry [name] is a 68 y.o. male who presents for Medicare Annual/Subsequent preventive examination.  This visit type was conducted due to national recommendations for restrictions regarding the COVID-19 Pandemic (e.g. social distancing). This format is felt to be most appropriate for this patient at this time. All issues noted in this document were discussed and addressed. No physical exam was performed (except for noted visual exam findings with Video Visits). This patient,[name], has given permission to perform this visit via telephone. Vital signs may be absent or patient reported.  Patient location:  At home  Nurse location:  At home     Review of Systems:  n/a Cardiac Risk Factors include: advanced age (>42mn, >>52women);diabetes mellitus;dyslipidemia;hypertension;male gender;obesity (BMI >30kg/m2)     Objective:    Vitals: Ht '5\' 9"'$  (1.753 m) Comment: per patient   Wt 258 lb (117 kg) Comment: per patient   BMI 38.10 kg/m   Body mass index is 38.1 kg/m.  Advanced Directives 08/16/2018 11/08/2017 04/13/2017 04/04/2017 02/18/2017 06/10/2015 10/13/2014  Does Patient Have a Medical Advance Directive? Yes Yes Yes Yes No No No  Type of AParamed[place]of [place] - [place] will - - -  Does patient want to make changes to medical advance directive? No - Patient declined No - Patient declined - - - - -  Copy of [place] Chart? No - copy requested Yes - No - copy requested - - -  Would patient like information on creating a medical advance directive? - - - - No - Patient declined No - patient declined information -  Pre-existing out of facility DNR order (yellow form or pink MOST form) - - - - - - -    Tobacco Social History   Tobacco Use  Smoking Status Never Smoker  Smokeless Tobacco Never Used     Counseling given: Not  Answered   Clinical Intake:  Pre-visit preparation completed: Yes  Pain : No/denies pain     Nutritional Status: BMI > 30  Obese Nutritional Risks: None Diabetes: Yes CBG done?: No Did pt. bring in CBG monitor from home?: No  How often do you need to have someone help you when you read instructions, pamphlets, or other written materials from your doctor or pharmacy?: 1 - Never What is the last grade level you completed in school?: 12th grade  Interpreter Needed?: No  Information entered by :: NAllen LPN  Past Medical History:  Diagnosis Date   AICD (automatic cardioverter/defibrillator) present    Dr TLovena Leoffice visit yearly, MDT    Arthritis    cane   Asthma    Benign neoplasm of colon    CAD (coronary artery disease)    Cardiomyopathy    Cataract    Complication of anesthesia    pt states that he got a rash   Constipation    Deaf    rightear, 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)   DM (diabetes mellitus) ([place]    TYPE 2    GERD (gastroesophageal reflux disease)    Glaucoma    right eye   HLD (hyperlipidemia)    HTN (hypertension)    pt denies 08/19/12   Hyperplasia, prostate    Hyperthyroidism    thyroid lobectomy   [place]    Dr CStanford Breed2000, x3vessels bypass   Nephrolithiasis    OSA (obstructive sleep apnea)  AHI-28,on CPAP, noncompliant with CPAP   Parkinson disease ([place])    1999   PONV (postoperative nausea and vomiting)    Restless legs    Shortness of breath    Hx: of at all times   Sleep apnea    does not use CPAP   Sleep apnea, organic    UTI (lower urinary tract infection) 09/15/12   Klebsiella   Ventral hernia    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   CARDIAC CATHETERIZATION     CATARACT EXTRACTION W/ INTRAOCULAR LENS IMPLANT     Hx: of right eye   CATARACT EXTRACTION W/ INTRAOCULAR LENS IMPLANT Left 2018    COLONOSCOPY N/A 10/13/2014   Procedure: COLONOSCOPY;  Surgeon: Jerene Bears, MD;  Location: [facility];  Service: Gastroenterology;  Laterality: N/A;   COLONOSCOPY W/ BIOPSIES AND POLYPECTOMY     Hx: of   CORONARY ARTERY BYPASS GRAFT  2000   [name], MD   CORONARY STENT PLACEMENT  1998   DEEP BRAIN STIMULATOR PLACEMENT  2004   Right and left VIN stimulator placement (parkinsons)   EYE SURGERY     FINGER AMPUTATION     left pointer   IMPLANTABLE CARDIOVERTER DEFIBRILLATOR IMPLANT N/A 11/13/2013   Procedure: IMPLANTABLE CARDIOVERTER DEFIBRILLATOR IMPLANT;  Surgeon: Evans Lance, MD;  Location: [facility] CATH LAB;  Service: Cardiovascular;  Laterality: N/A;   INSERT / REPLACE / REMOVE PACEMAKER     LEFT AND RIGHT HEART CATHETERIZATION WITH CORONARY ANGIOGRAM N/A 09/24/2013   Procedure: LEFT AND RIGHT HEART CATHETERIZATION WITH CORONARY ANGIOGRAM;  Surgeon: Burnell Blanks, MD;  Location: [facility] CATH LAB;  Service: Cardiovascular;  Laterality: N/A;   LITHOTRIPSY     3 different times   MEDIAN STERNOTOMY  2000   PULSE GENERATOR IMPLANT Right 11/13/2017   Procedure: Right chest implantable pulse generator change;  Surgeon: Erline Levine, MD;  Location: [place];  Service: Neurosurgery;  Laterality: Right;  Right chest implantable pulse generator change   SUBTHALAMIC STIMULATOR BATTERY REPLACEMENT N/A 09/05/2012   Procedure: Deep brain stimulator battery change;  Surgeon: Erline Levine, MD;  Location: [place] [facility]URO ORS;  Service: Neurosurgery;  Laterality: N/A;  Deep brain stimulator battery change   SUBTHALAMIC STIMULATOR BATTERY REPLACEMENT N/A 06/10/2015   Procedure: Deep Brain stimulator battery change;  Surgeon: Erline Levine, MD;  Location: [place] NEURO ORS;  Service: Neurosurgery;  Laterality: N/A;   TONSILLECTOMY     Family History  Problem Relation Age of Onset   Aneurysm Mother    Alcoholism Father    HIV/AIDS Brother 64       AIDS   Healthy Child    Peripheral vascular  disease Other    Arthritis Other    Healthy Child    Healthy Child    Diabetes Neg Hx    Heart disease Neg Hx    Colon cancer Neg Hx    Prostate cancer Neg Hx    Social History   Socioeconomic History   Marital status: Married    Spouse name: [name]   Number of children: 2   Years of education: Not on file   Highest education level: Not on file  Occupational History   Occupation: DISABLED    Comment: [employer], [employer] resource strain: Not hard at all   Food insecurity    Worry: Never true    Inability: Never true  Transportation needs    Medical: No    Non-medical: No  Tobacco Use   Smoking status: Never Smoker   Smokeless tobacco: Never Used  Substance and Sexual Activity   Alcohol use: Not Currently   Drug use: No   Sexual activity: Not Currently  Lifestyle   Physical activity    Days per week: 3 days    Minutes per session: 10 min   Stress: Not at all  Relationships   Social connections    Talks on phone: Not on file    Gets together: Not on file    Attends religious service: Not on file    Active member of club or organization: Not on file    Attends meetings of clubs or organizations: Not on file    Relationship status: Not on file  Other Topics Concern   Not on file  Social History Narrative   From [facility]   Retired/disability Clinical research associate   Likes to fish.     Married 1972   3 kids   [place] fan    Outpatient Encounter Medications as of 08/16/2018  Medication Sig   acetaminophen (TYLENOL) 500 MG tablet Take 500 mg by mouth 2 (two) times daily as needed for moderate pain or headache.   albuterol (PROVENTIL HFA;VENTOLIN HFA) 108 (90 Base) MCG/ACT inhaler Inhale 2 puffs into the lungs every 6 (six) hours as needed for wheezing or shortness of breath.   allopurinol (ZYLOPRIM) 100 MG tablet Take 1.5 tablets (150 mg total) by mouth daily.   AMBULATORY NON FORMULARY MEDICATION  Ustep walker DX: G20   aspirin EC 81 MG tablet Take 81 mg by mouth daily.   Carbidopa-Levodopa ER (SINEMET CR) 25-100 MG tablet controlled release TAKE 2 TABS AT 7 AM, THEN 2 TABS AT 12PM, THEN 1 TABS AT 4PM, 1 TAB AT BEDTIME   cholecalciferol (VITAMIN D) 1000 units tablet Take 1,000 Units by mouth daily.   colchicine 0.6 MG tablet TAKE 1 TABLET BY MOUTH TWICE A DAY AS NEEDED FOR GOUT   ferrous sulfate 325 (65 FE) MG EC tablet Take 325 mg by mouth daily.    furosemide (LASIX) 80 MG tablet TAKE 1 & 1/2 TABLETS DAILY   gabapentin (NEURONTIN) 100 MG capsule TAKE 2 CAPSULES BY MOUTH EVERY MORNING AND TAKE 4 CAPSULES AT BEDTIME   glucose blood (ONETOUCH VERIO) test strip USE AS DIRECTED TO CHECK BLOOD SUGAR TWICE A DAY. DX:E11.65   glucose blood test strip 1 each by Other route as needed for other. Use as instructed to check blood sugar 4 times daily.   HUMALOG 100 UNIT/ML injection USE MAXIMUM 76 UNITS PER   DAY WITH V-GO PUMP   Insulin Disposable Pump (OMNIPOD DASH SYSTEM) KIT 1 each by Does not apply route. Use Dash system for Continuous Blood Glucose Monitoring.   KLOR-CON M20 20 MEQ tablet TAKE 2 TABLETS (40 MEQ TOTAL) BY MOUTH DAILY.   levothyroxine (SYNTHROID) 125 MCG tablet TAKE 1 TABLET BY MOUTH EVERY DAY BEFORE BREAKFAST   liraglutide (VICTOZA) 18 MG/3ML SOPN Inject 0.3 mLs (1.8 mg total) into the skin daily.   lisinopril (PRINIVIL,ZESTRIL) 2.5 MG tablet Take 1 tablet (2.5 mg total) by mouth daily.   metolazone (ZAROXOLYN) 2.5 MG tablet TAKE 1 TABLET BY MOUTH EVERY DAY AS NEEDED FOR > 3LB IN A DAY OR > 5LB IN A WEEK   metoprolol succinate (TOPROL-XL) 50 MG 24 hr tablet TAKE 1 TABLET BY MOUTH DAILY. TAKE WITH OR IMMEDIATELY FOLLOWING A  MEAL.   Multiple Vitamin (MULTIVITAMIN WITH MINERALS) TABS tablet Take 1 tablet by mouth daily.    OVER THE COUNTER MEDICATION Apply 1 application topically at bedtime. Theraworx Pain Cream   polyethylene glycol (MIRALAX / GLYCOLAX) packet  Take 17 g by mouth daily as needed for moderate constipation.   rosuvastatin (CRESTOR) 20 MG tablet TAKE 1 TABLET BY MOUTH EVERY DAY   traMADol (ULTRAM) 50 MG tablet TAKE 1 TABLET BY MOUTH 2 TIMES DAILY AS NEEDED FOR MODERATE PAIN.   travoprost, benzalkonium, (TRAVATAN) 0.004 % ophthalmic solution Place 1 drop into the right eye at bedtime.    vitamin B-12 (CYANOCOBALAMIN) 1000 MCG tablet Take 1,000 mcg by mouth daily.   WIXELA INHUB 250-50 MCG/DOSE AEPB INHALE 1 PUFF INTO THE LUNGS 2 TIMES DAILY.   nitroGLYCERIN (NITROSTAT) 0.4 MG SL tablet Place 1 tablet (0.4 mg total) under the tongue every 5 (five) minutes as needed for chest pain. (Patient not taking: Reported on 11/07/2017)   No facility-administered encounter medications on file as of 08/16/2018.     Activities of Daily Living In your present state of health, do you have any difficulty performing the following activities: 08/16/2018 11/08/2017  Hearing? Tempie Donning  Comment has a hearing aide deaf in right ear-hearing aid  Vision? N N  Difficulty concentrating or making decisions? Y N  Comment memory not as good as it was -  Walking or climbing stairs? Y Y  Dressing or bathing? Y Y  Doing errands, shopping? Y -  Conservation officer, nature and eating ? Y -  Comment can't stand up to do it -  Using the Toilet? Y -  In the past six months, have you accidently leaked urine? Y -  Do you have problems with loss of bowel control? N -  Managing your Medications? Y -  Managing your Finances? Y -  Housekeeping or managing your Housekeeping? Y -  Some recent data might be hidden    Patient Care Team: Tonia Ghent, MD as PCP - General (Family Medicine) Kidney, Henrine Screws, Eustace Quail, DO as Consulting Physician (Neurology)   Assessment:   This is a routine wellness examination for [facility].  Exercise Activities and Dietary recommendations Current Exercise Habits: Home exercise routine, Type of exercise: strength training/weights, Time (Minutes):  10, Frequency (Times/Week): 2, Weekly Exercise (Minutes/Week): 20  Goals     Increase physical activity     Starting 04/04/2017,  I will continue to participate in physical therapy for 45 minutes twice weekly.      Patient Stated     08/16/2018, would like to feel better       Fall Risk Fall Risk  08/16/2018 03/20/2018 02/11/2018 10/25/2017 07/23/2017  Falls in the past year? '1 1 1 '$ Yes Yes  Comment had 2 falls. loses balance - - - -  Number falls in past yr: - '1 1 2 '$ or more 2 or more  Injury with Fall? 0 0 1 No No  Risk Factor Category  - - - High Fall Risk High Fall Risk  Risk for fall due to : History of fall(s);Impaired balance/gait;Impaired mobility;Medication side effect History of fall(s);Impaired balance/gait Impaired balance/gait;Impaired mobility - -  Follow up Falls evaluation completed;Falls prevention discussed Falls evaluation completed Falls evaluation completed;Education provided;Falls prevention discussed Falls evaluation completed Falls evaluation completed   Is the patient's home free of loose throw rugs in walkways, pet beds, electrical cords, etc?   yes      Grab bars in the bathroom?  yes      Handrails on the stairs?   n/a      Adequate lighting?   yes  Timed Get Up and Go Performed: n/a  Depression Screen PHQ 2/9 Scores 08/16/2018 04/13/2017 04/04/2017 09/21/2015  PHQ - 2 Score 0 6 0 2  PHQ- 9 Score 3 - 0 12    Cognitive Function MMSE - Mini Mental State Exam 08/16/2018 04/04/2017  Orientation to time 5 5  Orientation to Place 5 5  Registration 3 3  Attention/ Calculation 5 0  Recall 3 2  Recall-comments - unable to recall 1 of 3 words  Language- name 2 objects 0 0  Language- repeat 1 1  Language- follow 3 step command 0 3  Language- read & follow direction 0 0  Write a sentence 0 0  Copy design 0 0  Total score 22 19   Mini Cog  Mini-Cog screen was completed. Maximum score is 22. A value of 0 denotes this part of the MMSE was not completed or the patient  failed this part of the Mini-Cog screening.       Immunization History  Administered Date(s) Administered   H1N1 01/22/2008   Influenza Split 12/22/2010, 11/07/2011   Influenza Whole 10/17/2007, 11/16/2008, 11/09/2009   Influenza,inj,Quad PF,6+ Mos 10/01/2014, 09/21/2015, 10/27/2016   Influenza-Unspecified 10/25/2012, 11/27/2013   Pneumococcal Conjugate-13 03/25/2015   Pneumococcal Polysaccharide-23 01/09/2010, 04/10/2017   Tdap 12/04/2012    Qualifies for Shingles Vaccine? yes  Screening Tests Health Maintenance  Topic Date Due   COLONOSCOPY  10/12/2017   OPHTHALMOLOGY EXAM  04/10/2018   FOOT EXAM  04/11/2018   INFLUENZA VACCINE  08/10/2018   HEMOGLOBIN A1C  12/14/2018   TETANUS/TDAP  12/05/2022   Hepatitis C Screening  Completed   PNA vac Low Risk Adult  Completed   Cancer Screenings: Lung: Low Dose CT Chest recommended if Age 18-80 years, 30 pack-year currently smoking OR have quit w/in 15years. Patient does not qualify. Colorectal: due  Additional Screenings:  Hepatitis C Screening:06/2017      Plan:    Patient goal is to feel better.  I have personally reviewed and noted the following in the patients chart:    Medical and social history  Use of alcohol, tobacco or illicit drugs   Current medications and supplements  Functional ability and status  Nutritional status  Physical activity  Advanced directives  List of other physicians  Hospitalizations, surgeries, and ER visits in previous 12 months  Vitals  Screenings to include cognitive, depression, and falls  Referrals and appointments  In addition, I have reviewed and discussed with patient certain preventive protocols, quality metrics, and best practice recommendations. A written personalized care plan for preventive services as well as general preventive health recommendations were provided to patient.     Kellie Simmering, LPN  05/14/3746

## 2018-08-16 NOTE — Progress Notes (Signed)
PCP notes:  Health Maintenance:  Colonoscopy,foot exam and eye exam are due. States had eye exam a couple months ago.  Abnormal Screenings:  Falls: States had 2 falls just in the past month, but has had more .  Patient concerns:  States that his face feels like it is sweating, but it is not  Nurse concerns:  Multiple falls  Next PCP appt.: 08/23/2018 at 3:15

## 2018-08-16 NOTE — Patient Instructions (Signed)
Henry [name] , Thank you for taking time to come for your Medicare Wellness Visit. I appreciate your ongoing commitment to your health goals. Please review the following plan we discussed and let me know if I can assist you in the future.   Screening recommendations/referrals: Colonoscopy: due Recommended yearly ophthalmology/optometry visit for glaucoma screening and checkup Recommended yearly dental visit for hygiene and checkup  Vaccinations: Influenza vaccine: 10/2016 Pneumococcal vaccine: 04/2017 Tdap vaccine: 11/2012 Shingles vaccine: discussed    Advanced directives: Please bring a copy of your POA (Power of [place]) and/or Living Will to your next appointment.    Conditions/risks identified: obesity  Next appointment: 08/23/2018 at 3:15  Preventive Care 20 Years and Older, Male Preventive care refers to lifestyle choices and visits with your health care provider that can promote health and wellness. What does preventive care include?  A yearly physical exam. This is also called an annual well check.  Dental exams once or twice a year.  Routine eye exams. Ask your health care provider how often you should have your eyes checked.  Personal lifestyle choices, including:  Daily care of your teeth and gums.  Regular physical activity.  Eating a healthy diet.  Avoiding tobacco and drug use.  Limiting alcohol use.  Practicing safe sex.  Taking low doses of aspirin every day.  Taking vitamin and mineral supplements as recommended by your health care provider. What happens during an annual well check? The services and screenings done by your health care provider during your annual well check will depend on your age, overall health, lifestyle risk factors, and family history of disease. Counseling  Your health care provider may ask you questions about your:  Alcohol use.  Tobacco use.  Drug use.  Emotional well-being.  Home and relationship well-being.   Sexual activity.  Eating habits.  History of falls.  Memory and ability to understand (cognition).  Work and work Statistician. Screening  You may have the following tests or measurements:  Height, weight, and BMI.  Blood pressure.  Lipid and cholesterol levels. These may be checked every 5 years, or more frequently if you are over 37 years old.  Skin check.  Lung cancer screening. You may have this screening every year starting at age 96 if you have a 30-pack-year history of smoking and currently smoke or have quit within the past 15 years.  Fecal occult blood test (FOBT) of the stool. You may have this test every year starting at age 68.  Flexible sigmoidoscopy or colonoscopy. You may have a sigmoidoscopy every 5 years or a colonoscopy every 10 years starting at age 29.  Prostate cancer screening. Recommendations will vary depending on your family history and other risks.  Hepatitis C blood test.  Hepatitis B blood test.  Sexually transmitted disease (STD) testing.  Diabetes screening. This is done by checking your blood sugar (glucose) after you have not eaten for a while (fasting). You may have this done every 1-3 years.  Abdominal aortic aneurysm (AAA) screening. You may need this if you are a current or former smoker.  Osteoporosis. You may be screened starting at age 30 if you are at high risk. Talk with your health care provider about your test results, treatment options, and if necessary, the need for more tests. Vaccines  Your health care provider may recommend certain vaccines, such as:  Influenza vaccine. This is recommended every year.  Tetanus, diphtheria, and acellular pertussis (Tdap, Td) vaccine. You may need a Td booster every  10 years.  Zoster vaccine. You may need this after age 5.  Pneumococcal 13-valent conjugate (PCV13) vaccine. One dose is recommended after age 12.  Pneumococcal polysaccharide (PPSV23) vaccine. One dose is recommended after  age 31. Talk to your health care provider about which screenings and vaccines you need and how often you need them. This information is not intended to replace advice given to you by your health care provider. Make sure you discuss any questions you have with your health care provider. Document Released: 01/22/2015 Document Revised: 09/15/2015 Document Reviewed: 10/27/2014 Elsevier Interactive Patient Education  2017 [employer] Prevention in the Home Falls can cause injuries. They can happen to people of all ages. There are many things you can do to make your home safe and to help prevent falls. What can I do on the outside of my home?  Regularly fix the edges of walkways and driveways and fix any cracks.  Remove anything that might make you trip as you walk through a door, such as a raised step or threshold.  Trim any bushes or trees on the path to your home.  Use bright outdoor lighting.  Clear any walking paths of anything that might make someone trip, such as rocks or tools.  Regularly check to see if handrails are loose or broken. Make sure that both sides of any steps have handrails.  Any raised decks and porches should have guardrails on the edges.  Have any leaves, snow, or ice cleared regularly.  Use sand or salt on walking paths during winter.  Clean up any spills in your garage right away. This includes oil or grease spills. What can I do in the bathroom?  Use night lights.  Install grab bars by the toilet and in the tub and shower. Do not use towel bars as grab bars.  Use non-skid mats or decals in the tub or shower.  If you need to sit down in the shower, use a plastic, non-slip stool.  Keep the floor dry. Clean up any water that spills on the floor as soon as it happens.  Remove soap buildup in the tub or shower regularly.  Attach bath mats securely with double-sided non-slip rug tape.  Do not have throw rugs and other things on the floor that can  make you trip. What can I do in the bedroom?  Use night lights.  Make sure that you have a light by your bed that is easy to reach.  Do not use any sheets or blankets that are too big for your bed. They should not hang down onto the floor.  Have a firm chair that has side arms. You can use this for support while you get dressed.  Do not have throw rugs and other things on the floor that can make you trip. What can I do in the kitchen?  Clean up any spills right away.  Avoid walking on wet floors.  Keep items that you use a lot in easy-to-reach places.  If you need to reach something above you, use a strong step stool that has a grab bar.  Keep electrical cords out of the way.  Do not use floor polish or wax that makes floors slippery. If you must use wax, use non-skid floor wax.  Do not have throw rugs and other things on the floor that can make you trip. What can I do with my stairs?  Do not leave any items on the stairs.  Make sure  that there are handrails on both sides of the stairs and use them. Fix handrails that are broken or loose. Make sure that handrails are as long as the stairways.  Check any carpeting to make sure that it is firmly attached to the stairs. Fix any carpet that is loose or worn.  Avoid having throw rugs at the top or bottom of the stairs. If you do have throw rugs, attach them to the floor with carpet tape.  Make sure that you have a light switch at the top of the stairs and the bottom of the stairs. If you do not have them, ask someone to add them for you. What else can I do to help prevent falls?  Wear shoes that:  Do not have high heels.  Have rubber bottoms.  Are comfortable and fit you well.  Are closed at the toe. Do not wear sandals.  If you use a stepladder:  Make sure that it is fully opened. Do not climb a closed stepladder.  Make sure that both sides of the stepladder are locked into place.  Ask someone to hold it for you,  if possible.  Clearly mark and make sure that you can see:  Any grab bars or handrails.  First and last steps.  Where the edge of each step is.  Use tools that help you move around (mobility aids) if they are needed. These include:  Canes.  Walkers.  Scooters.  Crutches.  Turn on the lights when you go into a dark area. Replace any light bulbs as soon as they burn out.  Set up your furniture so you have a clear path. Avoid moving your furniture around.  If any of your floors are uneven, fix them.  If there are any pets around you, be aware of where they are.  Review your medicines with your doctor. Some medicines can make you feel dizzy. This can increase your chance of falling. Ask your doctor what other things that you can do to help prevent falls. This information is not intended to replace advice given to you by your health care provider. Make sure you discuss any questions you have with your health care provider. Document Released: 10/22/2008 Document Revised: 06/03/2015 Document Reviewed: 01/30/2014 Elsevier Interactive Patient Education  2017 [employer].

## 2018-08-23 ENCOUNTER — Ambulatory Visit (INDEPENDENT_AMBULATORY_CARE_PROVIDER_SITE_OTHER): Payer: Medicare Other | Admitting: Family Medicine

## 2018-08-23 ENCOUNTER — Other Ambulatory Visit: Payer: Self-pay

## 2018-08-23 ENCOUNTER — Encounter: Payer: Self-pay | Admitting: Family Medicine

## 2018-08-23 DIAGNOSIS — G20 Parkinson's disease: Secondary | ICD-10-CM | POA: Diagnosis not present

## 2018-08-23 DIAGNOSIS — M109 Gout, unspecified: Secondary | ICD-10-CM

## 2018-08-23 DIAGNOSIS — Z1211 Encounter for screening for malignant neoplasm of colon: Secondary | ICD-10-CM | POA: Diagnosis not present

## 2018-08-23 DIAGNOSIS — I2510 Atherosclerotic heart disease of native coronary artery without angina pectoris: Secondary | ICD-10-CM | POA: Diagnosis not present

## 2018-08-23 DIAGNOSIS — E1165 Type 2 diabetes mellitus with hyperglycemia: Secondary | ICD-10-CM | POA: Diagnosis not present

## 2018-08-23 DIAGNOSIS — I255 Ischemic cardiomyopathy: Secondary | ICD-10-CM | POA: Diagnosis not present

## 2018-08-23 MED ORDER — METOLAZONE 2.5 MG PO TABS: ORAL_TABLET | ORAL | Status: DC

## 2018-08-23 MED ORDER — FUROSEMIDE 80 MG PO TABS: 80.0000 mg | ORAL_TABLET | Freq: Two times a day (BID) | ORAL | Status: DC

## 2018-08-23 NOTE — Patient Instructions (Addendum)
Check with your insurance to see if they will cover the shingrix shot. I would get a flu shot each fall.    Don't change your meds for now and let me update GI and neurology.  Take care.  Glad to see you.

## 2018-08-23 NOTE — Progress Notes (Signed)
Feeling of facial sweating R>L.  No actual sweating but he has the sensation of that going on.  Feels like it is external, on the skin.  He has f/u with neurology pending.  Local compression/touch with a cloth helps.    Diabetes per endo.  He had some bleeding with the pump change once.  Isolated event.  Controled with pressure, quickly stopped.  No other bleeding.  D/w pt about routine cautions.  No nosebleeds, no gum bleeding.  No hematuria, no blood in stool  Fall risk d/w pt.  Has a wheelchair.  He has gait changes at baseline.  Gait is worse in the last month.   He had to quit home health PT due to covid precautions.  He has rails at home.  I will update neurology.   Colon can screening d/w pt. I will update the GI clinic about this.  He opts in.  Gout. No recent colchicine use.  D/w pt.  Still on allopurinol.    CAD.   Using medication without problems or lightheadedness: yes Chest pain with exertion: not exertional but occ brief discomfort with a deep breath.  He attributed to a fall with chest wall irritation.   Edema:improved with med changes.   Short of breath: no  PMH and SH reviewed  Meds, vitals, and allergies reviewed.   ROS: Per HPI unless specifically indicated in ROS section   GEN: nad, alert and oriented, speech at baseline.  Using a rolling device to walk. HEENT: ncat NECK: supple w/o LA CV: rrr. PULM: ctab, no inc wob ABD: soft, +bs EXT: no edema SKIN: no acute rash  Diabetic foot exam: Normal inspection No skin breakdown No calluses  Normal DP pulses Normal sensation to light touch and monofilament Nails normal

## 2018-08-25 DIAGNOSIS — Z1211 Encounter for screening for malignant neoplasm of colon: Secondary | ICD-10-CM | POA: Insufficient documentation

## 2018-08-25 DIAGNOSIS — I2510 Atherosclerotic heart disease of native coronary artery without angina pectoris: Secondary | ICD-10-CM | POA: Insufficient documentation

## 2018-08-25 NOTE — Assessment & Plan Note (Signed)
Colon can screening d/w pt. I will update the GI clinic about this.  He opts in.

## 2018-08-25 NOTE — Assessment & Plan Note (Signed)
Continue allopurinol.  No recent need for colchicine.

## 2018-08-25 NOTE — Assessment & Plan Note (Signed)
The episode with bleeding with pump change was a single event.  It sounds like he had a bleeding event, but not a bleeding disorder otherwise.  Would observe for now.  I defer to endocrine otherwise.  He agrees.

## 2018-08-25 NOTE — Assessment & Plan Note (Signed)
No change in meds at this point.  Continue secondary prevention.  Would continue aspirin, lisinopril, Crestor.  Swelling is improved with combination of metolazone and furosemide. >25 minutes spent in face to face time with patient, >50% spent in counselling or coordination of care.

## 2018-08-25 NOTE — Assessment & Plan Note (Signed)
Fall risk d/w pt.  Has a wheelchair.  He has gait changes at baseline.  Gait is worse in the last month.   He had to quit home health PT due to covid precautions.  He has rails at home.  I will update neurology and ask for input.

## 2018-08-27 ENCOUNTER — Telehealth: Payer: Self-pay | Admitting: Internal Medicine

## 2018-08-27 ENCOUNTER — Telehealth: Payer: Self-pay | Admitting: *Deleted

## 2018-08-27 NOTE — Telephone Encounter (Signed)
The pt has a previsit on 9/9 and a colon on 9/23.  He wanted to know if he should change colon since he has some "heart issues"  He was advised to keep the appt for previsit and discuss concerns with the nurse.  The pt has been advised of the information and verbalized understanding.

## 2018-08-27 NOTE — Telephone Encounter (Signed)
-----   Message from Jerene Bears, MD sent at 08/27/2018  9:16 AM EDT ----- OV okay, app ----- Message ----- From: [name], CMA Sent: 08/27/2018 To: Jerene Bears, MD  Dr Hilarie Fredrickson- Patient seems to have quite a complex medical hx (cardiac hx, uncontrolled diabetes, parkinsons, ckd etc.). Do you need him to have office visit first or do you want direct colon still? ----- Message ----- From: Jerene Bears, MD Sent: 08/26/2018  11:17 AM EDT To: [name], CMA  Pt needs surveillance colon, due now (overdue actually) Thanks JMP ----- Message ----- From: Tonia Ghent, MD Sent: 08/25/2018  10:37 PM EDT To: Jerene Bears, MD  Will you please have your clinic contact this patient about follow-up colon cancer screening?  He opts in.  Thanks.

## 2018-08-27 NOTE — Telephone Encounter (Signed)
Patient is scheduled to see Nicoletta Ba, PA-C on 09/11/18 to discuss.

## 2018-08-27 NOTE — Telephone Encounter (Signed)
Left message for patient to call back. Will need office appointment with extender to discuss colonoscopy

## 2018-08-27 NOTE — Telephone Encounter (Signed)
Pt is scheduled for colonoscopy from recall--pt would like to know whether his procedure needs to be scheduled at the hospital since he has heart issues.

## 2018-09-03 ENCOUNTER — Other Ambulatory Visit: Payer: Self-pay | Admitting: Family Medicine

## 2018-09-04 ENCOUNTER — Telehealth: Payer: Self-pay | Admitting: Family Medicine

## 2018-09-04 NOTE — Telephone Encounter (Signed)
Please notify patient.  I had talked with Dr. Carles Collet.  I apologize for my delay in response.  I want him to keep the follow-up appointment with her.  PT (which he has done previously) may be the best option to decrease his fall risk.  I do not see a good option in terms of medication change in the meantime.  If he wants me to set PT up again then let me know.  Thanks.

## 2018-09-05 NOTE — Telephone Encounter (Signed)
Patient advised and will let us know if he would want the PT at a later time.

## 2018-09-11 ENCOUNTER — Ambulatory Visit (INDEPENDENT_AMBULATORY_CARE_PROVIDER_SITE_OTHER): Payer: Medicare Other | Admitting: Physician Assistant

## 2018-09-11 ENCOUNTER — Telehealth: Payer: Self-pay | Admitting: *Deleted

## 2018-09-11 ENCOUNTER — Encounter: Payer: Self-pay | Admitting: Physician Assistant

## 2018-09-11 ENCOUNTER — Other Ambulatory Visit: Payer: Self-pay

## 2018-09-11 VITALS — BP 121/70 | HR 69 | Temp 97.4°F | Ht 69.0 in | Wt 257.0 lb

## 2018-09-11 DIAGNOSIS — Z86010 Personal history of colonic polyps: Secondary | ICD-10-CM | POA: Diagnosis not present

## 2018-09-11 DIAGNOSIS — I255 Ischemic cardiomyopathy: Secondary | ICD-10-CM | POA: Diagnosis not present

## 2018-09-11 DIAGNOSIS — IMO0001 Reserved for inherently not codable concepts without codable children: Secondary | ICD-10-CM

## 2018-09-11 DIAGNOSIS — I509 Heart failure, unspecified: Secondary | ICD-10-CM

## 2018-09-11 DIAGNOSIS — I428 Other cardiomyopathies: Secondary | ICD-10-CM | POA: Diagnosis not present

## 2018-09-11 DIAGNOSIS — Z794 Long term (current) use of insulin: Secondary | ICD-10-CM

## 2018-09-11 DIAGNOSIS — E119 Type 2 diabetes mellitus without complications: Secondary | ICD-10-CM | POA: Diagnosis not present

## 2018-09-11 MED ORDER — NA SULFATE-K SULFATE-MG SULF 17.5-3.13-1.6 GM/177ML PO SOLN: ORAL | 0 refills | Status: DC

## 2018-09-11 NOTE — Patient Instructions (Signed)
Due to recent COVID-19 restrictions implemented by our local and state authorities and in an effort to keep both patients and staff as safe as possible, our hospital system now requires COVID-19 testing prior to any scheduled hospital procedure. Please go to our [facility] location drive thru testing site ([address], [place], [place] [address]) on 10/10/2018, at 10am  (Wednesday hours are 9:30 am-12 pm and Saturday hours are 9 am-12:30 pm). There will be multiple testing areas, the first checkpoint being for pre-procedure/surgery testing. Get into the right (yellow) lane that leads to the PAT testing team. You will not be billed at the time of testing but may receive a bill later depending on your insurance. The approximate cost of the test is $100. You must agree to quarantine from the time of your testing until the procedure date on 10/14/2018 . This should include staying at home with ONLY the people you live with. Avoid take-out, grocery store shopping or leaving the house for any non-emergent reason. Please call our office at [phone] if you have any questions.   You have been scheduled for a colonoscopy at [facility], Separate instructions given  HOLD Iron 5 days before your procedure  We will contact you about your Insulin Pump  Increase Miralax to 2 doses daily until bowel moving then decrease to 1/2 to 1 dose daily everyday  If you are age 22 or older, your body mass index should be between 23-30. Your Body mass index is 37.95 kg/m. If this is out of the aforementioned range listed, please consider follow up with your Primary Care Provider.  If you are age 22 or younger, your body mass index should be between 19-25. Your Body mass index is 37.95 kg/m. If this is out of the aformentioned range listed, please consider follow up with your Primary Care Provider.

## 2018-09-11 NOTE — Progress Notes (Addendum)
Subjective:    Patient ID: Henry [name], male    DOB: 1950/12/14, 68 y.o.   MRN: [id number]  HPI Henry [name] is a pleasant 68 year old white male, known to Dr. Hilarie Fredrickson who comes in to discuss follow-up colonoscopy.  Last colonoscopy was done in April 2016 with 7 sessile polyps removed.  4 these were tubular adenomas, and one was a sessile serrated polyp, and he is indicated for 3-year interval follow-up. Patient has significant comorbidities with congestive heart failure and ischemic cardiomyopathy.  He is status post ICD and pacemaker and has EF of 30 to 35%, insulin-dependent diabetes mellitus for which he uses an insulin pump, Parkinson's disease for which she has had a brain stimulator.  History of coronary artery disease status post CABG in 2000, sleep apnea with CPAP use, new oxygen use and hypertension. Patient says he struggles with ongoing problems with constipation.  He generally stays on MiraLAX on a daily basis but says sometimes this results in loose stool or diarrhea.  Generally if this happens he stops the MiraLAX for a day or 2 and then resumes.  Unfortunately once he stops the MiraLAX then he may not have a bowel movement for 5 or 6 days.  He has no complaints of abdominal pain, and has not noticed any rectal bleeding.  Review of Systems Pertinent positive and negative review of systems were noted in the above HPI section.  All other review of systems was otherwise negative.  Outpatient Encounter Medications as of 09/11/2018  Medication Sig  . acetaminophen (TYLENOL) 500 MG tablet Take 500 mg by mouth 2 (two) times daily as needed for moderate pain or headache.  . albuterol (PROVENTIL HFA;VENTOLIN HFA) 108 (90 Base) MCG/ACT inhaler Inhale 2 puffs into the lungs every 6 (six) hours as needed for wheezing or shortness of breath.  . allopurinol (ZYLOPRIM) 100 MG tablet Take 1.5 tablets (150 mg total) by mouth daily.  . AMBULATORY NON FORMULARY MEDICATION Ustep walker DX: G20  .  aspirin EC 81 MG tablet Take 81 mg by mouth daily.  . Carbidopa-Levodopa ER (SINEMET CR) 25-100 MG tablet controlled release TAKE 2 TABS AT 7 AM, THEN 2 TABS AT 12PM, THEN 1 TABS AT 4PM, 1 TAB AT BEDTIME  . cholecalciferol (VITAMIN D) 1000 units tablet Take 1,000 Units by mouth daily.  . colchicine 0.6 MG tablet TAKE 1 TABLET BY MOUTH TWICE A DAY AS NEEDED FOR GOUT  . ferrous sulfate 325 (65 FE) MG EC tablet Take 325 mg by mouth daily.   . furosemide (LASIX) 80 MG tablet Take 1 tablet (80 mg total) by mouth 2 (two) times daily.  Marland Kitchen gabapentin (NEURONTIN) 100 MG capsule TAKE 2 CAPSULES BY MOUTH EVERY MORNING AND TAKE 4 CAPSULES AT BEDTIME  . glucose blood (ONETOUCH VERIO) test strip USE AS DIRECTED TO CHECK BLOOD SUGAR TWICE A DAY. DX:E11.65  . glucose blood test strip 1 each by Other route as needed for other. Use as instructed to check blood sugar 4 times daily.  Marland Kitchen HUMALOG 100 UNIT/ML injection USE MAXIMUM 76 UNITS PER   DAY WITH V-GO PUMP  . Insulin Disposable Pump (OMNIPOD DASH SYSTEM) KIT 1 each by Does not apply route. Use Dash system for Continuous Blood Glucose Monitoring.  Marland Kitchen KLOR-CON M20 20 MEQ tablet TAKE 2 TABLETS (40 MEQ TOTAL) BY MOUTH DAILY.  Marland Kitchen levothyroxine (SYNTHROID) 125 MCG tablet TAKE 1 TABLET BY MOUTH EVERY DAY BEFORE BREAKFAST  . liraglutide (VICTOZA) 18 MG/3ML SOPN Inject 0.3 mLs (  1.8 mg total) into the skin daily.  Marland Kitchen lisinopril (PRINIVIL,ZESTRIL) 2.5 MG tablet Take 1 tablet (2.5 mg total) by mouth daily.  . metolazone (ZAROXOLYN) 2.5 MG tablet TAKE 1 TABLET BY MOUTH ONCE A WEEK.  . metoprolol succinate (TOPROL-XL) 50 MG 24 hr tablet TAKE 1 TABLET BY MOUTH DAILY. TAKE WITH OR IMMEDIATELY FOLLOWING A MEAL.  . Multiple Vitamin (MULTIVITAMIN WITH MINERALS) TABS tablet Take 1 tablet by mouth daily.   Marland Kitchen OVER THE COUNTER MEDICATION Apply 1 application topically at bedtime. Theraworx Pain Cream  . polyethylene glycol (MIRALAX / GLYCOLAX) packet Take 17 g by mouth daily as needed for  moderate constipation.  . rosuvastatin (CRESTOR) 20 MG tablet TAKE 1 TABLET BY MOUTH EVERY DAY  . traMADol (ULTRAM) 50 MG tablet TAKE 1 TABLET BY MOUTH 2 TIMES DAILY AS NEEDED FOR MODERATE PAIN.  Marland Kitchen travoprost, benzalkonium, (TRAVATAN) 0.004 % ophthalmic solution Place 1 drop into the right eye at bedtime.   . vitamin B-12 (CYANOCOBALAMIN) 1000 MCG tablet Take 1,000 mcg by mouth daily.  Grant Ruts INHUB 250-50 MCG/DOSE AEPB INHALE 1 PUFF INTO THE LUNGS 2 TIMES DAILY.  . Na Sulfate-K Sulfate-Mg Sulf 17.5-3.13-1.6 GM/177ML SOLN 1 kit  . [DISCONTINUED] nitroGLYCERIN (NITROSTAT) 0.4 MG SL tablet Place 1 tablet (0.4 mg total) under the tongue every 5 (five) minutes as needed for chest pain. (Patient not taking: Reported on 11/07/2017)   No facility-administered encounter medications on file as of 09/11/2018.    Allergies  Allergen Reactions  . Penicillins Rash and Other (See Comments)    WEARS ALLERGY BRACELET Because of a history of documented adverse serious drug reaction;Medi Alert bracelet  is recommended PATIENT HAS HAD A PCN REACTION WITH IMMEDIATE RASH, FACIAL/TONGUE/THROAT SWELLING, SOB, OR LIGHTHEADEDNESS WITH HYPOTENSION:  #  #  YES  #  #  Has patient had a PCN reaction causing severe rash involving mucus membranes or skin necrosis: unknown Has patient had a PCN reaction that required hospitalization NO Has patient had a PCN reaction occurring within the last 10 years: NO  . Klonopin [Clonazepam] Other (See Comments)    agitation  . Peanut-Containing Drug Products Cough  . Watermelon [Citrullus Vulgaris] Other (See Comments) and Cough    Tickle in throat   Patient Active Problem List   Diagnosis Date Noted  . Colon cancer screening 08/25/2018  . CAD (coronary artery disease) 08/25/2018  . Diabetes mellitus type 2, uncontrolled ([place]) 08/15/2018  . Shoulder pain 10/07/2017  . Thrombocytopenia ([place]) 07/08/2017  . Gout, unspecified 06/06/2017  . Dysuria 05/25/2017  . Microscopic  hematuria 05/25/2017  . CKD (chronic kidney disease) stage 3, GFR 30-59 ml/min ([facility]) 04/12/2017  . Neuropathy 02/25/2017  . Urinary frequency 09/19/2016  . Constipation 11/27/2015  . Medicare annual wellness visit, initial 09/23/2015  . Advance care planning 09/23/2015  . Spinal stenosis of lumbar region 01/26/2015  . History of colonic polyps   . Restless leg syndrome 08/11/2014  . ICD- MDT, implanted 11/13/13 11/14/2013  . Chronic systolic CHF (congestive heart failure) ([place]) 11/13/2013  . Acute on chronic combined systolic and diastolic congestive heart failure, NYHA class 4 ([place]) 05/12/2013  . 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 Elevated PSA 12/04/2012  . Other malaise and fatigue 12/04/2012  . PD (Parkinson's disease) ([place]) 08/19/2012  . OSA on CPAP 04/17/2012  . Dysautonomia ([place]) 04/17/2012  . Obesity, morbid ([place]) 04/17/2012  . Hypothyroidism following radioiodine therapy 10/16/2011  . DYSPNEA/SHORTNESS OF BREATH 11/09/2009  . GERD 05/11/2009  . IDDM (  insulin dependent diabetes mellitus) ([place]) 04/26/2009  . OTHER SPEC FORMS CHRONIC ISCHEMIC HEART DISEASE 04/24/2008  . HYPERPLASIA PROSTATE UNS W/O UR OBST & OTH LUTS 09/02/2007  . NEPHROLITHIASIS, HX OF 09/02/2007  . HERNIA, VENTRAL 12/28/2006  . Hyperlipidemia 08/31/2006  . HTN (hypertension) 08/31/2006  . Hx of CABG x 3 in 2000 08/31/2006   Social History   Socioeconomic History  . Marital status: Married    Spouse name: [name]  . Number of children: 2  . Years of education: Not on file  . Highest education level: Not on file  Occupational History  . Occupation: DISABLED    Comment: [employer]  Social Needs  . Financial resource strain: Not hard at all  . Food insecurity    Worry: Never true    Inability: Never true  . Transportation needs    Medical: No    Non-medical: No  Tobacco Use  . Smoking status: Never Smoker  . Smokeless tobacco: Never Used  Substance and Sexual Activity  .  Alcohol use: Not Currently  . Drug use: No  . Sexual activity: Not Currently  Lifestyle  . Physical activity    Days per week: 3 days    Minutes per session: 10 min  . Stress: Not at all  Relationships  . Social Herbalist on phone: Not on file    Gets together: Not on file    Attends religious service: Not on file    Active member of club or organization: Not on file    Attends meetings of clubs or organizations: Not on file    Relationship status: Not on file  . Intimate partner violence    Fear of current or ex partner: No    Emotionally abused: No    Physically abused: No    Forced sexual activity: No  Other Topics Concern  . Not on file  Social History Narrative   From [facility]   Retired/disability Clinical research associate   Likes to fish.     Married 1972   3 kids   [place] fan    Mr. Creamer family history includes Alcoholism in his father; Aneurysm in his mother; Arthritis in an other family member; HIV/AIDS (age of onset: 32) in his brother; Healthy in his child, child, and child; Peripheral vascular disease in an other family member.      Objective:    Vitals:   09/11/18 1058  BP: 121/70  Pulse: 69  Temp: (!) 97.4 F (36.3 C)  SpO2: 95%    Physical Exam Well-developed well-nourished older white male in no acute distress..  Accompanied by his wife.  Patient ambulates with a walker  Weight 257, BMI 37.9  HEENT; nontraumatic normocephalic, EOMI, PER R LA, sclera anicteric. Oropharynx; not examined/wearing mask/COVID Neck; supple, no JVD Cardiovascular; regular rate and rhythm with S1-S2, no murmur rub or gallop Pulmonary; Clear bilaterally Abdomen; soft, obese, nontender nondistended, no palpable mass or hepatosplenomegaly, bowel sounds are active Rectal; not done Skin; benign exam, no jaundice rash or appreciable lesions Extremities; no clubbing cyanosis or edema skin warm and dry Neuro/Psych; alert and oriented x4, grossly nonfocal mood  and affect appropriate       Assessment & Plan:  #24 68 year old white male with history of adenomatous and sessile serrated colon polyps, overdue for follow-up colonoscopy #2 ischemic cardiomyopathy with EF 30 to 35% status post ICD and pacemaker #3 insulin-dependent diabetes mellitus-insulin pump #4.  Parkinson's disease status post brain stimulator #  5.  History of dysautonomia #6.  Coronary artery disease status post CABG #7.  Obstructive sleep apnea #8.  Hypertension  Plan; patient will be scheduled for Colonoscopy with Dr. Hilarie Fredrickson at the hospital (October 5).  Procedure was discussed in detail with patient including indications risks and benefits and he is agreeable to proceed. Hold iron for 5 days prior to procedure. Patient was advised to increase MiraLAX to 2 doses daily until his bowels are moving fairly regularly then decrease to 1/2-1 dose daily every day.   Henry Jean S Saliha Salts PA-C 09/11/2018   Cc: Tonia Ghent, MD

## 2018-09-11 NOTE — Telephone Encounter (Signed)
Henry [name] Jan 02, 1951 [id number]  Dear Dr. :[name], MD has scheduled the above patient for a colonoscopy  at [facility] on 10/14/2018.  Our records show that he/she is on insulin therapy via an insulin pump.  Our colonoscopy prep protocol requires that:  the patient must be on a clear liquid diet the entire day prior to the procedure date as well as the morning of the procedure  the patient must be NPO for 2 hours prior to the procedure   the patient must consume a PEG 3350 solution to prepare for the procedure.  Please advise [place] of any adjustments that need to be made to the patient's insulin pump therapy prior to the above procedure date.    Please route or fax back this completed form to me at [phone] .  If you have any question, please call me at [phone].  Thank you for your help with this matter.  Sincerely,

## 2018-09-13 ENCOUNTER — Other Ambulatory Visit: Payer: Self-pay

## 2018-09-17 NOTE — Telephone Encounter (Signed)
We will let you know after his visit tomorrow

## 2018-09-17 NOTE — Progress Notes (Signed)
Henry [name] was seen today in the movement disorders clinic for neurologic consultation at the request of [name].  His PCP is Tonia Ghent, MD.  The consultation is for the evaluation of PD and to manage his DBS.  He is accompanied by his wife who supplements the history.  The first symptom(s) the patient noticed was right hand tremor in 1999.  He was seen by neurology and was dx with PD.  He was placed on something, but it caused sleepiness.  He does not think that he has ever been on levodopa.   He was placed on Mirapex, which seemed to help.  He only takes it twice per day.  He didn't think it made a difference when he took it tid.  He began to have tremor and it was suggested he do DBS.    The pt is s/p stn DBS in 2005.  He had a battery change in 2009.    10/02/12 update: The patient is accompanied today by his daughter, who supplements the history.  I reviewed medical records available to me since last visit.  The patient had his generator changed on 09/05/2012.  He remains on pramipexole, 0.5 mg twice per day.  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 [name] in that regard.  He is not compliant with CPAP so says that he doesn't want to return to her for f/u. He doesn't take the klonopin faithfully.  10/11/12 update:  Pt was seen as a walk in/work in today.  Pt had increasing tremor, L greater than R for 3 days.  Thinks that it started with the d/c of artane.  Speech stable.  11/19/12 update:  Pt is seen today for his PD, accompanied by his daughter who supplements the hx.    He is currently on klonopin 0.5 mg - 1/2-1 tablet q hs.  He only takes it when his wife is not working.  She works nights and is only in 2 days per week.  He has some reluctance to take it other nights.  He is on pramipexole 0.5 mg bid.  Last visit, his DBS was reset more similar to the settings he had prior to coming here, just with an increased voltage.  About 2 weeks after our last  visit, the patient decided to go back on the Artane.  The combination of the Artane and the DBS changes helped significantly.  He does ask if I can slightly increased voltage on the left hand, as he has some tremor at night that is bothersome.  Otherwise, he is doing well.  He feels that his balance has been great.  02/19/13 update:  This patient is accompanied in the office by his child who supplements the history.  The pt has a hx of PD.  He has been tremor free and is very happy about that.  He has had some increased balance loss; the last fall was a few months ago but he hasn't gotten hurt.  Was considering hernia surgery.  The records that were made available to me were reviewed.  He is holding on that for now but plans to have it done before end of year.  No hallucinations.  He is still having some acting out of the dreams, despite clonazepam.  06/19/13 update:  Patient is returning to followup regarding his Parkinson's disease.  I had the opportunity to review records since last visit.  He went to the emergency room on  05/12/2013 with chest pain and shortness of breath.  He had been fishing and had missed several doses of Lasix.  He had been eating seafood.  He ended up with an acute exacerbation of his chronic congestive heart failure.  Once he was diuresed and back on his medications, he is feeling better.  He just had a nuclear medicine study done on 06/17/2013 and the ejection fraction on this looked better than on his echocardiogram.  The left ventricular ejection fraction was 41%.  Prior to that, there was some concern that his left ventricular ejection fraction had dropped so much that he may need an ICD.  In terms of Parkinson's disease, the patient states that he has been doing very well in terms of tremor, but his walking really has deteriorated.  He describes a festinating gait, with much more shuffling.  He fell walking over his dog gate but otherwise has not had falls.  He went fishing  yesterday and states that he "stumbled all day long."  No hallucinations.  He has been trying to be more faithful with his CPAP. He remains on Mirapex 0.5 mg twice a day, Artane 2 mg twice a day.  10/23/13 update:  The patient returns today to the clinic, accompanied by his wife who supplements the history.  From a Parkinson's standpoint, the patient states that he has been doing very well.  No tremor.  He started on levodopa last visit and states that he has had no falls and overall stumbling has been much better.  He states that the only time that he stumbles is if he is inside of his fishing boat, and states that that is really because it is small and unstable.  He is taking his levodopa in the morning, after lunch and bedtime.  He has had no hallucinations.  His wife states that he is still draining and acting out the dreams and even fell out of bed a few days ago.  However, he is taking his clonazepam about 10 PM but doesn't go to bed until 1 AM.  He is not using his CPAP.  Is having significant constipation.   I reviewed his cardiology records since last visit.  He did have a heart catheterization and is scheduled to have an ICD placed on November 5.  02/23/14 update:  Pt returns for follow up accompanied by his wife who supplements the history.  The records that were made available to me were reviewed since last visit.  He had an ICD placed on 11/15/13.  He is doing well.  Taking carbidopa/levodopa 25/100 three times a day and remains on artane as well as mirapex 0.5 mg bid.  Had a fall up the stairs.  Wife describes festinating gate and pt thinks that levodopa contributes.  He also doesn't feel good much of the time and thinks that is from levodopa.  Admits, however, that when BS under good control, he feels good.  Has started seeing endocrinology and started on new meds.  He has klonopin for RBD.  Still vivid dreams but no falling out bed (in recliner now).  Having some word finding trouble and memory  doesn't seem clear.  Also, put back tailgate down on car and came down on generator and wants me to look at that.    07/01/14 update:  The patient follows up today regarding his Parkinsons disease.  He is accompanied by his wife who supplements the history.  Last visit, the patient was complaining about memory change which  I thought was likely multifactorial and due to medication such as Artane, Mirapex and Ultram, which he was taking every 3 hours.  The patient, however, thought that it was from levodopa and so we held it and the patient reported that he felt better and therefore continued to stay off of the medication.  He reports that falls are better after d/c levodopa but wife reports still falls.  Pt states that he only fell in shower after he d/c levodopa.  His wife does admit that she thinks that levodopa was causing loss of balance.  He is having more tremor over the last month on the L hand.   He remains on clonazepam 0.5 mg, 1-1/2 tablets at night for REM behavior disorder.  I reviewed records since last visit.  He has seen Dr. Dwyane Dee multiple times in regards to his uncontrolled diabetes.  He started on insulin on May 22 2014.  He states that he is doing well with injecting himself.  Having a lot of constipation.  Has the rancho recipe.    11/03/14 update:  The patient is following up today, accompanied by his wife who supplements the history.  Records were reviewed since our last visit.  The patient is on pramipexole 0.5 mg twice a day and Artane 2 mg twice a day.  He has not had any hallucinations.  Had a fall the other day walking up stairs carrying something and fell backward.  Hit his back.  No LOC.  Also fell backward in the summer around the pool.  [place] one time out of his boat.  Doesn't want to use a walker. He was in the emergency room recently with a rash that was felt secondary to insect bites that he got in the woods.  He also recently had a colonoscopy.  His diabetes is under better  control and his last A1c was 7.2.  States that he now has an insulin pump.  He remains on clonazepam 0.5 mg, 1-1/2 tablets at night for REM behavior disorder.  More back pain lately and will start injections for the back which have helped previously  02/04/15 update:  The patient presents today, accompanied by his wife who supplements the history.  I have reviewed prior records made available to me.  He has established a new primary care doctor in Dr. Damita Dunnings.  The patient remains on Artane, 2 mg twice a day as well as pramipexole 0.5 mg twice a day.  Overall, he has been doing fairly well.  He occasionally still has some tremor.  He had 2 falls since our last visit; he states that he was pushing a gate open and it came back and hit him.  He was also carrying wood the other day and fell forward with wood.  No hallucinations.  No lightheadedness or near syncope.  He remains on clonazepam 0.5 mg, 1-1/2 tablets at night for REM behavior disorder.  [facility] is doing [facility] at [facility].    06/08/15 update:  This patient is accompanied in the office by his spouse who supplements the history.  The patient remains on Artane, 2 mg twice a day as well as pramipexole 0.5 mg twice a day.  He tripped over the curb at [employer].  He tripped over the step on his deck.   He remains on clonazepam 0.5 mg, 1-1/2 tablets at night for REM behavior disorder.  He is still having trouble with that.  He fell out of bed the other night.  Wife asks about bed rails.   He felt that [facility] helped.  Is looking forward to getting in his pool.    09/09/15 update:  The patient follows up today, accompanied by his wife who supplements the history.  He is on pramipexole 0.5 g twice a day and trihexyphenidyl 2 mg twice a day.  Didn't go up to the 1.5 mg of pramipexole.   He is on clonazepam 0.5 mg, 1-1/2 tablets at night for REM behavior disorder.  His biggest issue is RLS at bedtime.  It is preventing him from going to sleep.    He denies any hallucinations.  He denies cognitive change.  He continues to struggle with balance and has fallen/stumbled a few times.  He has fallen in the boat.  Wife describes festinating gait.  Wife also describes crying frequently.  Denies depression. He has his battery changed on 06/10/15  12/18/15 update:  Pt is following up today, accompanied by his wife who supplements the history.  Last visit, I changed him from pramipexole, 0.5 mg twice per day to Mirapex ER, 1.5 mg once per day.  He states that it is very expensive.  He remains on trihexyphenidyl, 2 mg twice per day.  He is also on clonazepam 0.5 mg, 1-1/2 tablets at night for REM behavior disorder. It turns out he was taking this at 6pm.   I reviewed records since last visit.  He reports that his pharmacist to come off his Aldactone because of an interaction with his Parkinson's medicine.  I am unaware of any interaction with his current medications.  Been having issues with constipation, which has been treated by Dr. Damita Dunnings.  [facility] is in [facility].  When asked about falls, his wife states that he falls all the time but he is more conscious of festinating gait now that he is in PT.  Not gotten hurt with falls.    07/13/16 update:  Pt f/u today for PD, accompanied by his wife, who supplements the history.  The records that were made available to me were reviewed since last visit.  On pramipexole ER 1.5 mg daily and artane 2 mg bid.  Was on klonopin for RBD but he d/c the medication.  He thought that it caused insomnia.  He is still acting out the dreams.  He won't use his CPAP machine.  States that he is afraid of it malfunctioning.  Often will sleep in the recliner.  Having more back pain.  Having injections and 3rd is next week at [facility].    Wearing off:  No.  How long before next dose:  n/a Falls:   Yes.  , 12 months but some of these were falls out of bed in RBD N/V:  No. Hallucinations:  Yes.   (none visual but few auditory and has  gotten out of bed to see who was there)  visual distortions: No. Lightheaded:  No.  Syncope: No. Dyskinesia:  No.   12/15/16 update: Patient seen today in follow-up for his Parkinson's disease.  He is accompanied by his wife who supplements the history.  He remains on pramipexole ER, 1.5 mg daily.  He is also on trihexyphenidyl, 2 mg.  He was taking bid but he looked at his bottle and realized he was supposed to be taking it qd and just started doing that.  He has not had any hallucinations, except a few arising in the middle of the night out of a vivid dream.  No compulsive  behaviors.  I have reviewed records made available to me from his other providers, including his endocrinologist, cardiologist and primary care physician.  Off of klonopin.  "If I take it I sleep the entire next day."  He is having vivid dreams.  States that he has not fallen that much until last night.  Wife describes festinating gait.  Having more trouble in the shower.  A shower chair will not fit in his shower.  They have a small corner seat, but he states that he cannot wash his feet if he sits in it.  It is too slippery.  He is a currently attending [facility] through [facility] for his low back pain.  He does like that.  04/20/17 update: Patient is seen today in follow-up for his Parkinson's disease.  Patient is accompanied by his wife who supplements the history.  Patient is on pramipexole, 1.5 mg daily.  He is also on Artane, 2 mg daily.  No hallucinations.  No lightheadedness or near syncope.  I have talked to his primary care physician about him.  Primary care wanted to get an MRI of his brain due to hearing loss. 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  Discussed with his primary care physician that if he needed MRI, his device is MRI compatible for head MRI, but would need [employer] rep there to turn off the device and make sure  impedances were normal the day of MRI. Has new hearing aid as of today and doing well with that.   [place] today and tried to get off of toilet and hit door knob and fell.  Still going fishing and friend helps him navigate the water.  Went yesterday. Having more renal dysfunction and urinary urgency and dysuria.  No UTI per urology and placed on myrbetriq.  Has referral to nephrology but has not heard from them yet.  06/29/17 update: Patient is seen today in follow-up for Parkinson's.  We tried to discontinue his trihexyphenidyl, but they felt that his walking got worse after discontinuing the medication.  It was restarted, but I told them to trial off of it again as trihexyphenidyl generally has a negative effect on walking and not positive.  He ended up staying on the medication.  Overall, they do think that balance has been worse as has been acting out of the dreams.  He has had 2-3 falls.  He is having freezing.  Battery site sore since fall.   He describes hallucinations but they arise out of the sleep.  He will start dreaming and it feels real.  He states that it goes away when wakes up.  Wife isn't so sure.  He is tried clonazepam but that it made him too sleepy.  wife states that she restarted klonopin a week ago, 0.5 mg, and wife thinks that it has helped sleep but still having a bit of that.  He is still on pramipexole ER, 1.5 mg daily.    07/23/17 update: Patient is seen today in follow-up for Parkinson's.  His device was adjusted last visit and I also had him start back on carbidopa/levodopa 25/100 and work to 1 tablet 3 times per day.  His pramipexole was decreased to 0.25 mg tid.  Today, he reports that he is feeling more stable with this change in meds.  He is back on klonopin and sleeping well.  He  fell in the kitchen.  He was near the stove and went to reach for the refrigerator and fell and glasses cut his face.  He had his walker near him but wasn't using it because "I didn't need it."     He is  having trouble remembering middle of the day dosing but generally takes med at 8am/2pm/8pm (didn't take 2 pm today and seen at 3pm)  10/25/17 update: Patient is seen today in follow-up for Parkinson's disease, accompanied by his wife who supplements history.  He is supposed to be on carbidopa/levodopa 25/100, 2/2/1 but is still only on carbidopa/levodopa 25/100 tid.  He reports that he started on the correct dosage initially but had old bottle and was running out early. Wife states that he thinks she is trying to harm him when given proper dose since old directions were on the bottle.   He then tells me "it makes me too dizzy."    He is off of pramipexole and artane.  Multiple falls but falling always without the walker.  Has hit head hard with fall onto oak cabinet and worries because tremor worse after that.   More cramping in the feet at night.    02/11/18 update: Patient is seen today in follow-up for Parkinson's disease, accompanied by his wife who supplements the history.  I discontinued his immediate release levodopa last visit because of dizziness.  I started him on carbidopa/levodopa 25/100 CR and told him to take 2 tablets at 7 AM, 2 tablets at noon, 1 tablet at 4 PM and 1 tablet at bedtime, in hopes that it would help the nighttime cramping.  He states that it helped but he also got some shot in his foot that has helped.  He rubs some cream on it that he thinks helps.  Last visit, I encouraged him to get a U step walker given the falls.  He is using a walker some at home but not always.   He still falls.  He actually pulled his kitchen cabinet out of the wall due to fall.  He is exercising at home.  He is walking straight ahead better now that he is exercising but has trouble in the turns.   His DBS battery was changed on November 13, 2017.  He thinks that he is shaking more since the battery change - L leg and R arm.   Records have been reviewed.  He has seen his primary care physician about shoulder  pain.  He has been following with Dr. Dwyane Dee regarding his diabetes.  tells me about facial paresthesias - "I feel like i've been having on my glasses when I don't."   Involves the face/cheek/forehead on the right.  First says it started a year ago but then wife states that it was a few months ago.  Had a CT after our last visit and isn't sure if had the sx then.    03/20/18 update: Patient seen today as a work in appointment at the request of his endocrinologist.  He was seen there on March 6 and apparently was complaining about pulling in his face.  Patient reports that it has been going on for months.   There is no pain.  There is no visible pulling of the face.  It seems to be worse at night when he lays down.  When he came in last visit, he was complaining about some facial paresthesias on the right and today states that really this is the same thing  that he is talking about when he described pulling to Dr. Dwyane Dee.  It is mainly on the right side but is some on the L.   He states today it is in the R temple to the R jaw.  It seems to come and go but worse over the last month.  Feels a bit like it is "numb but not numb."   Admits that he has fallen and hit his head since our last visit.   He is on carbidopa/levodopa 25/100 CR, 2 tablets at 7 AM, 2 tablets at noon, 1 tablet at 4 PM and 1 tablet at bedtime.  He is in [facility]SV[facility] and is liking that.  He does complain about some drooling.  09/19/18 update: Patient seen today in follow-up for Parkinson's disease.  He is on carbidopa/levodopa 25/100 CR, 2 tablets at 7 AM, 2 tablets at noon, 1 tablet at 4 PM, 1 tablet at bedtime.  I have received several calls since our last visit regarding his right-sided facial paresthesias, which we have talked about in the past.  I tried to get a contrasted CT of the brain, but ultimately the kidney function was not good enough for that and I just got a regular CT, which was unremarkable.  His device is not MRI compatible.  Medication was  offered to the patient, but declined.  Falls continue to be an issue for him and I have discussed this with his primary care physician as well.  Pt states that they are actually better.  He thinks that most are in the bathroom as walker doesn't maneuver well in there.  He raised his bed again to see if that would help.  Last fall was last week and fell on the knee.  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 - "I feel like I am walking on a ball."  PREVIOUS MEDICATIONS: Mirapex and artane, levodopa (made "too loose"); clonazepam (overly sleepy)  ALLERGIES:   Allergies  Allergen Reactions   Penicillins Rash and Other (See Comments)    WEARS ALLERGY BRACELET Because of a history of documented adverse serious drug reaction;Medi Alert bracelet  is recommended PATIENT HAS HAD A PCN REACTION WITH IMMEDIATE RASH, FACIAL/TONGUE/THROAT SWELLING, SOB, OR LIGHTHEADEDNESS WITH HYPOTENSION:  #  #  YES  #  #  Has patient had a PCN reaction causing severe rash involving mucus membranes or skin necrosis: unknown Has patient had a PCN reaction that required hospitalization NO Has patient had a PCN reaction occurring within the last 10 years: NO   Klonopin [Clonazepam] Other (See Comments)    agitation   Peanut-Containing Drug Products Cough   Watermelon [Citrullus Vulgaris] Other (See Comments) and Cough    Tickle in throat    CURRENT MEDICATIONS:   Allergies as of 09/19/2018      Reactions   Penicillins Rash, Other (See Comments)   WEARS ALLERGY BRACELET Because of a history of documented adverse serious drug reaction;Medi Alert bracelet  is recommended PATIENT HAS HAD A PCN REACTION WITH IMMEDIATE RASH, FACIAL/TONGUE/THROAT SWELLING, SOB, OR LIGHTHEADEDNESS WITH HYPOTENSION:  #  #  YES  #  #  Has patient had a PCN reaction causing severe rash involving mucus membranes or skin necrosis: unknown Has patient had a PCN reaction that required hospitalization NO Has patient had a PCN  reaction occurring within the last 10 years: NO   Klonopin [clonazepam] Other (See Comments)   agitation   Peanut-containing Drug Products Cough  Watermelon [citrullus Vulgaris] Other (See Comments), Cough   Tickle in throat      Medication List       Accurate as of September 19, 2018 12:27 PM. If you have any questions, ask your nurse or doctor.        acetaminophen 500 MG tablet Commonly known as: TYLENOL Take 500 mg by mouth 2 (two) times daily as needed for moderate pain or headache.   albuterol 108 (90 Base) MCG/ACT inhaler Commonly known as: VENTOLIN HFA Inhale 2 puffs into the lungs every 6 (six) hours as needed for wheezing or shortness of breath.   allopurinol 100 MG tablet Commonly known as: ZYLOPRIM Take 1.5 tablets (150 mg total) by mouth daily.   AMBULATORY NON FORMULARY MEDICATION Ustep walker DX: G20   aspirin EC 81 MG tablet Take 81 mg by mouth daily.   Carbidopa-Levodopa ER 25-100 MG tablet controlled release Commonly known as: SINEMET CR 2 tablets at 7am/2 tablets at 11am/2 at 3pm/1 at 7pm/1 at bedtime What changed: See the new instructions. Changed by: Wells Guiles Winner Valeriano, DO   cholecalciferol 1000 units tablet Commonly known as: VITAMIN D Take 1,000 Units by mouth daily.   colchicine 0.6 MG tablet TAKE 1 TABLET BY MOUTH TWICE A DAY AS NEEDED FOR GOUT   ferrous sulfate 325 (65 FE) MG EC tablet Take 325 mg by mouth daily.   furosemide 80 MG tablet Commonly known as: LASIX Take 1 tablet (80 mg total) by mouth 2 (two) times daily.   gabapentin 100 MG capsule Commonly known as: NEURONTIN TAKE 2 CAPSULES BY MOUTH EVERY MORNING AND TAKE 4 CAPSULES AT BEDTIME   glucose blood test strip Commonly known as: OneTouch Verio USE AS DIRECTED TO CHECK BLOOD SUGAR TWICE A DAY. DX:E11.65   glucose blood test strip 1 each by Other route as needed for other. Use as instructed to check blood sugar 4 times daily.   HumaLOG 100 UNIT/ML injection Generic drug:  insulin lispro USE MAXIMUM 76 UNITS PER   DAY WITH V-GO PUMP   Klor-Con M20 20 MEQ tablet Generic drug: potassium chloride SA TAKE 2 TABLETS (40 MEQ TOTAL) BY MOUTH DAILY.   levothyroxine 125 MCG tablet Commonly known as: SYNTHROID TAKE 1 TABLET BY MOUTH EVERY DAY BEFORE BREAKFAST   liraglutide 18 MG/3ML Sopn Commonly known as: Victoza Inject 0.3 mLs (1.8 mg total) into the skin daily.   lisinopril 2.5 MG tablet Commonly known as: ZESTRIL Take 1 tablet (2.5 mg total) by mouth daily.   metolazone 2.5 MG tablet Commonly known as: ZAROXOLYN TAKE 1 TABLET BY MOUTH ONCE A WEEK.   metoprolol succinate 50 MG 24 hr tablet Commonly known as: TOPROL-XL TAKE 1 TABLET BY MOUTH DAILY. TAKE WITH OR IMMEDIATELY FOLLOWING A MEAL.   multivitamin with minerals Tabs tablet Take 1 tablet by mouth daily.   Na Sulfate-K Sulfate-Mg Sulf 17.5-3.13-1.6 GM/177ML Soln 1 kit   OmniPod Dash System Kit 1 each by Does not apply route. Use Dash system for Continuous Blood Glucose Monitoring.   OVER THE COUNTER MEDICATION Apply 1 application topically at bedtime. Theraworx Pain Cream   polyethylene glycol 17 g packet Commonly known as: MIRALAX / GLYCOLAX Take 17 g by mouth daily as needed for moderate constipation.   rosuvastatin 20 MG tablet Commonly known as: CRESTOR TAKE 1 TABLET BY MOUTH EVERY DAY   traMADol 50 MG tablet Commonly known as: ULTRAM TAKE 1 TABLET BY MOUTH 2 TIMES DAILY AS NEEDED FOR MODERATE PAIN.   travoprost (  benzalkonium) 0.004 % ophthalmic solution Commonly known as: TRAVATAN Place 1 drop into the right eye at bedtime.   vitamin B-12 1000 MCG tablet Commonly known as: CYANOCOBALAMIN Take 1,000 mcg by mouth daily.   Wixela Inhub 250-50 MCG/DOSE Aepb Generic drug: Fluticasone-Salmeterol INHALE 1 PUFF INTO THE LUNGS 2 TIMES DAILY.        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)   DM (diabetes mellitus) ([place])    TYPE 2    GERD (gastroesophageal reflux disease)    Glaucoma    right eye   HLD (hyperlipidemia)    HTN (hypertension)    pt denies 08/19/12   Hyperplasia, prostate    Hyperthyroidism    thyroid lobectomy   MI (myocardial infarction) ([place])    Dr Stanford Breed 2000, x3vessels bypass   Nephrolithiasis    OSA (obstructive sleep apnea)    AHI-28,on CPAP, noncompliant with CPAP   Parkinson disease ([place])    1999   PONV (postoperative nausea and vomiting)    Restless legs    Shortness of breath    Hx: of at all times   Sleep apnea    does not use CPAP   Sleep apnea, organic    UTI (lower urinary tract infection) 09/15/12   Klebsiella   Ventral hernia     PAST SURGICAL HISTORY:   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   CARDIAC CATHETERIZATION     CATARACT EXTRACTION W/ INTRAOCULAR LENS IMPLANT     Hx: of right eye   CATARACT EXTRACTION W/ INTRAOCULAR LENS IMPLANT Left 2018   COLONOSCOPY N/A 10/13/2014   Procedure: COLONOSCOPY;  Surgeon: Jerene Bears, MD;  Location: [facility];  Service: Gastroenterology;  Laterality: N/A;   COLONOSCOPY W/ BIOPSIES AND POLYPECTOMY     Hx: of   CORONARY ARTERY BYPASS GRAFT  2000   [name], MD   CORONARY STENT PLACEMENT  1998   DEEP BRAIN STIMULATOR PLACEMENT  2004   Right and left VIN stimulator placement (parkinsons)   EYE SURGERY     FINGER AMPUTATION     left pointer   IMPLANTABLE CARDIOVERTER DEFIBRILLATOR IMPLANT N/A 11/13/2013   Procedure: IMPLANTABLE CARDIOVERTER DEFIBRILLATOR IMPLANT;  Surgeon: Evans Lance, MD;  Location: [facility] CATH LAB;  Service: Cardiovascular;  Laterality: N/A;   INSERT / REPLACE / REMOVE PACEMAKER     LEFT AND RIGHT HEART CATHETERIZATION WITH CORONARY ANGIOGRAM N/A 09/24/2013   Procedure: LEFT AND RIGHT HEART CATHETERIZATION WITH CORONARY ANGIOGRAM;  Surgeon: Burnell Blanks, MD;  Location: [facility] CATH LAB;  Service: Cardiovascular;  Laterality: N/A;   LITHOTRIPSY     3 different times   MEDIAN STERNOTOMY  2000   PULSE GENERATOR IMPLANT Right 11/13/2017   Procedure: Right chest implantable pulse generator change;  Surgeon: Erline Levine, MD;  Location: [place];  Service: Neurosurgery;  Laterality: Right;  Right chest implantable pulse generator change   SUBTHALAMIC STIMULATOR  BATTERY REPLACEMENT N/A 09/05/2012   Procedure: Deep brain stimulator battery change;  Surgeon: Erline Levine, MD;  Location: [place] NEURO ORS;  Service: Neurosurgery;  Laterality: N/A;  Deep brain stimulator battery change   SUBTHALAMIC STIMULATOR BATTERY REPLACEMENT N/A 06/10/2015   Procedure: Deep Brain stimulator battery change;  Surgeon: Erline Levine, MD;  Location: [place] NEURO ORS;  Service: Neurosurgery;  Laterality: N/A;   TONSILLECTOMY      SOCIAL HISTORY:   Social History   Socioeconomic History   Marital status: Married    Spouse name: [name]   Number of children: 2   Years of education: 12   Highest education level: High school graduate  Occupational History   Occupation: DISABLED    Comment: [employer], [place] resource strain: Not hard at all   Food insecurity    Worry: Never true    Inability: Never true   Transportation needs    Medical: No    Non-medical: No  Tobacco Use   Smoking status: Never Smoker   Smokeless tobacco: Never Used  Substance and Sexual Activity   Alcohol use: Not Currently   Drug use: No   Sexual activity: Not Currently  Lifestyle   Physical activity    Days per week: 3 days    Minutes per session: 10  min   Stress: Not at all  Relationships   Social connections    Talks on phone: Not on file    Gets together: Not on file    Attends religious service: Not on file    Active member of club or organization: Not on file    Attends meetings of clubs or organizations: Not on file    Relationship status: Not on file   Intimate partner violence    Fear of current or ex partner: No    Emotionally abused: No    Physically abused: No    Forced sexual activity: No  Other Topics Concern   Not on file  Social History Narrative   From [place]   Retired/disability Clinical research associate   Likes to fish.     Married 1972   3 kids   [place] fan    FAMILY HISTORY:   Family Status  Relation Name Status   Mother  Deceased       complications of surgery   Father  Deceased   Brother  Deceased       1 brother, 42 half brother   Sister  Alive       2 half sisters   Child  Alive   Other  (Not Specified)   Sister  Alive   Brother  Alive   Child  Alive   Child  Alive   Neg Hx  (Not Specified)    ROS:  Review of Systems  Constitutional: Positive for malaise/fatigue.  HENT: Negative.   Eyes: Negative.   Cardiovascular: Negative.   Musculoskeletal: Positive for falls and joint pain (shoulder).  Skin: Negative.   Neurological: Positive for tingling.     PHYSICAL EXAMINATION:    VITALS:   Vitals:   09/19/18 1022  BP: 111/73  Pulse: 73  SpO2: 98%  Weight: 260 lb 9.6 oz (118.2 kg)  Height: '5\' 9"'$  (1.753 m)   GEN:  The patient appears stated age and is in NAD. HEENT:  Normocephalic, atraumatic.  The mucous membranes are moist. The superficial temporal arteries are without ropiness or tenderness. CV:  RRR Lungs:  CTAB Neck/HEME:  There are no carotid bruits bilaterally.  Neurological examination:  Orientation: The patient is alert and oriented x3. Cranial nerves: There is good facial symmetry.  Nasolabial fold is symmetric.  He is able to hold air tightly in  the cheeks.  He is able to raise the forehead symmetrically.  He is able to squeeze the eyes tightly shut.  The speech is fluent and dysarthric similar to previous visits. Soft palate rises symmetrically and there is no tongue deviation. Hearing is intact to conversational tone. Sensation: Sensation is intact to light touch throughout Motor: Strength is at least antigravity x4.   Shoulder shrug is equal and symmetric.  There is no pronator drift.  Movement examination: Tone: There is normal tone in the upper and lower extremities Abnormal movements: no tremor noted today (except when device off) Coordination:  There is decremation, with any form of RAMS, including alternating supination and pronation of the forearm, hand opening and closing, finger taps, heel taps and toe taps on the left Gait and Station: The patient pushes off the chair.  He uses his walker.  He has start hesitation.  He has some festination.  He does fairly well in the hallway until he reaches the turn, at which point he tries to back up and nearly falls but is able to catch himself.    LABS  Lab Results  Component Value Date   WBC 7.5 11/08/2017   HGB 12.5 (L) 11/08/2017   HCT 40.3 11/08/2017   MCV 88.0 11/08/2017   PLT 198 11/08/2017   Lab Results  Component Value Date   TSH 2.64 08/16/2018     Chemistry      Component Value Date/Time   NA 139 08/16/2018 0916   NA 142 12/22/2016 1233   K 3.7 08/16/2018 0916   CL 93 (L) 08/16/2018 0916   CO2 37 (H) 08/16/2018 0916   BUN 31 (H) 08/16/2018 0916   BUN 18 07/01/2018 0000   CREATININE 1.94 (H) 08/16/2018 0916   CREATININE 2.13 06/25/2017   CREATININE 1.53 (H) 05/22/2016 1106      Component Value Date/Time   CALCIUM 9.6 08/16/2018 0916   ALKPHOS 104 08/16/2018 0916   AST 8 08/16/2018 0916   ALT 8 08/16/2018 0916   BILITOT 0.5 08/16/2018 0916     Lab Results  Component Value Date   TUUEKCMK34 917 (H) 02/22/2017   Lab Results  Component Value Date    HGBA1C 8.3 (H) 08/16/2018   DBS programming was performed today, which is described in more detail on a separate programming procedural notes.    ASSESSMENT/PLAN:  1.  Idiopathic Parkinson's disease.    -Now off trihexyphenidyl and pramipexole  -His DBS battery was last changed on 09/05/2012 and on 06/10/15 and November 13, 2017.  Encouraged the patient to move around the neck to avoid further scarring around the wire on the right.  -use walker at all times   -Increase carbidopa/levodopa 25/100, 2 tablets at 7 AM, 2 tablets at 11 AM, 2 tablets at 3 PM, 1 tablet at 7 PM and 1 tablet at bedtime.  This will increase his overall daily levodopa load from 6 tablets/day to 8 tablets/day.  We discussed risks, benefits, and side effects.  -I wanted him to go back to LSVT but he was leery stating that exercise causes him to have pains.  He was willing to try the LSVT in the home and see how he does.   2.  RBD  -He has tried  clonazepam several times and ultimately ends up stopping it because of side effect.  Could try Rozerem in the future if insurance would pay.  Decided to hold off for now as the patient states that while dreaming is vivid, it is not particularly bothersome and he is not falling out of the bed.  Discussed bedroom safety. 3.  Pseudobulbar affect (crying)  -Wants no medication for that. 4.  OSAS, noncompliant with CPAP.  -Unfortunately, he has been unable to tolerate CPAP.  Talked about dental appliances, which he does not wish to try either. 5.  CHF  -ICD placed on 11/15/2013 6.  Facial paresthesias  -Has been going on for over 7 months.  He has had several negative noncontrast CTs of the brain.  Kidney function is not good enough for contrasted CT.  His DBS device is not MRI compatible.  -I wonder if this is not a form of trigeminal neuralgia.  We discussed Trileptal in detail.  Patient declines medication at this juncture.  He does state that he is thinking about medication, however.  -I  have turned off his DBS and he noticed no change in the facial paresthesias. 7.  Sialorrhea  -This is commonly associated with PD.  We talked about treatments.  The patient is not a candidate for oral anticholinergic therapy because of increased risk of confusion and falls.  We discussed Botox (type A and B) and 1% atropine drops.  We discusssed that candy like lemon drops can help by stimulating mm of the oropharynx to induce swallowing.  He does not want any treatment right now. 8.  Day/night reversal  -Long discussion with the patient regarding the importance of staying awake during the day and sleeping at night.  Discussed that this can ultimately cause confusion and hallucinations. 9.  Follow up is anticipated in the next 4-6 months, sooner should new neurologic issues arise.  Much greater than 50% of this visit was spent in counseling and coordinating care.  Total face to face time:  40 min , not including dbs time

## 2018-09-17 NOTE — Telephone Encounter (Signed)
Sent insulin pump letter x 2 and also faxed  Letter to [facility] office

## 2018-09-18 ENCOUNTER — Encounter: Payer: Self-pay | Admitting: Endocrinology

## 2018-09-18 ENCOUNTER — Other Ambulatory Visit: Payer: Self-pay

## 2018-09-18 ENCOUNTER — Ambulatory Visit (INDEPENDENT_AMBULATORY_CARE_PROVIDER_SITE_OTHER): Payer: Medicare Other | Admitting: Endocrinology

## 2018-09-18 VITALS — BP 120/60 | HR 94 | Ht 69.0 in | Wt 259.6 lb

## 2018-09-18 DIAGNOSIS — I255 Ischemic cardiomyopathy: Secondary | ICD-10-CM

## 2018-09-18 DIAGNOSIS — Z794 Long term (current) use of insulin: Secondary | ICD-10-CM | POA: Diagnosis not present

## 2018-09-18 DIAGNOSIS — Z23 Encounter for immunization: Secondary | ICD-10-CM | POA: Diagnosis not present

## 2018-09-18 DIAGNOSIS — E1165 Type 2 diabetes mellitus with hyperglycemia: Secondary | ICD-10-CM | POA: Diagnosis not present

## 2018-09-18 LAB — LIPID PANEL
Cholesterol: 116 mg/dL (ref 0–200)
HDL: 25.8 mg/dL — ABNORMAL LOW (ref >39.00)
NonHDL: 89.82
Total CHOL/HDL Ratio: 4
Triglycerides: 270 mg/dL — ABNORMAL HIGH (ref 0.0–149.0)
VLDL: 54 mg/dL — ABNORMAL HIGH (ref 0.0–40.0)

## 2018-09-18 LAB — LDL CHOLESTEROL, DIRECT: Direct LDL: 54 mg/dL

## 2018-09-18 LAB — GLUCOSE, RANDOM: Glucose, Bld: 249 mg/dL — ABNORMAL HIGH (ref 70–99)

## 2018-09-18 NOTE — Patient Instructions (Addendum)
Lunch carbs 55-60 and dinner 75-80 , add 10 more carbs for oatmeal  Check blood sugars on waking up 3-4 days a week  Also check blood sugars about 2 hours after meals and do this after different meals by rotation  Recommended blood sugar levels on waking up are 90-130 and about 2 hours after meal is 130-160  Please bring your blood sugar monitor to each visit, thank you    Call before refilling Victoza

## 2018-09-18 NOTE — Progress Notes (Signed)
Patient ID: Henry [name], male   DOB: 09/11/1950, 68 y.o.   MRN: [id number]           Reason for Appointment: Follow-up for Type 2 Diabetes   History of Present Illness:          Diagnosis: Type 2 diabetes mellitus, date of diagnosis: 2000      Past history:   Patient thinks he has been taking Amaryl for several years and probably metformin since onset also At some point he was changed from metformin to [facility] and was taking this since at least 2012 A1c had been higher in 2015 Since his A1c had been progressively higher with his regimen of Janumet and Amaryl he was started on Victoza in 01/2014 He was started on Lantus insulin in 5/16 because of persistent hyperglycemia especially fasting; was having readings as high as 293 Because of tendency to high postprandial readings and for control with Lantus he was switched to the V-go pump in  09/2014  Recent history:    INSULIN regimen: Omnipod insulin pump with Humalog insulin  Basal rate programs: Midnight = 1.7, 7 AM = 1.8, 6p = 2.0, total insulin 43 units  Boluses 4.5 units for breakfast, 4-6 lunch and 6-7 units for dinner  Non-insulin hypoglycemic drugs the patient is taking are:  Victoza 1.8 mg daily in am   His A1c had been increasing but now slightly better last month at 8.3: Has been as low as 7.2  Current management, blood sugar patterns and problems identified:  His overnight visit was increased slightly  However he still has periodic high readings in the mornings and today was 212  As before he did not check his sugars regularly at lunch and dinner and none after meals usually  He has a couple of readings after dinner which were around 200  Also lab glucose after breakfast in the lab last month was 241  He thinks he is bolusing consistently before his meals   He thinks he is cutting back on snacks and usually not eating much at night, last night had some Ritz crackers at bedtime  He is not able to program his  PDM on his own and still uses the old PDM since he can check his blood sugar with diet also  Not able to do any significant physical activity  Weight is about the same  Hypoglycemia:   none  He has seen the dietitian in 04/2017  Not able to exercise because of back pain and difficulty walking       Side effects from medications have been: None Compliance with the medical regimen: Fair   Glucose monitoring:  Recently less than 2 times  a day         Glucometer:  Freestyle      Blood Glucose readings by download of pump    PRE-MEAL Fasting Lunch Dinner Bedtime Overall  Glucose range:  111-219   147-243    Mean/median: 160   183  180   POST-MEAL PC Breakfast PC Lunch PC Dinner  Glucose range:   280  190, 224  Mean/median:       Previous readings:         PRE-MEAL Fasting Lunch Dinner Bedtime Overall  Glucose range:  146-303  155-185  103-261 ?   Mean/median:  177   180  178 +/-31     Meals: 9 am , 2-3 pm and 6-7 pm  Dietician visit, most recent: 4/19               Weight history:   Wt Readings from Last 3 Encounters:  09/18/18 259 lb 9.6 oz (117.8 kg)  09/11/18 257 lb (116.6 kg)  08/23/18 261 lb 3 oz (118.5 kg)    Glycemic control:   Lab Results  Component Value Date   HGBA1C 8.3 (H) 08/16/2018   HGBA1C 8.5 (H) 06/14/2018   HGBA1C 7.8 (H) 03/12/2018   Lab Results  Component Value Date   MICROALBUR <0.7 06/14/2018   LDLCALC 118 (H) 09/16/2015   CREATININE 1.94 (H) 08/16/2018    Lab Results  Component Value Date   FRUCTOSAMINE 277 11/09/2017   FRUCTOSAMINE 350 (H) 03/29/2017   FRUCTOSAMINE 250 09/16/2015     Other active problems: See review of systems      Allergies as of 09/18/2018      Reactions   Penicillins Rash, Other (See Comments)   WEARS ALLERGY BRACELET Because of a history of documented adverse serious drug reaction;Medi Alert bracelet  is recommended PATIENT HAS HAD A PCN REACTION WITH IMMEDIATE RASH, FACIAL/TONGUE/THROAT  SWELLING, SOB, OR LIGHTHEADEDNESS WITH HYPOTENSION:  #  #  YES  #  #  Has patient had a PCN reaction causing severe rash involving mucus membranes or skin necrosis: unknown Has patient had a PCN reaction that required hospitalization NO Has patient had a PCN reaction occurring within the last 10 years: NO   Klonopin [clonazepam] Other (See Comments)   agitation   Peanut-containing Drug Products Cough   Watermelon [citrullus Vulgaris] Other (See Comments), Cough   Tickle in throat      Medication List       Accurate as of September 18, 2018  2:43 PM. If you have any questions, ask your nurse or doctor.        acetaminophen 500 MG tablet Commonly known as: TYLENOL Take 500 mg by mouth 2 (two) times daily as needed for moderate pain or headache.   albuterol 108 (90 Base) MCG/ACT inhaler Commonly known as: VENTOLIN HFA Inhale 2 puffs into the lungs every 6 (six) hours as needed for wheezing or shortness of breath.   allopurinol 100 MG tablet Commonly known as: ZYLOPRIM Take 1.5 tablets (150 mg total) by mouth daily.   AMBULATORY NON FORMULARY MEDICATION Ustep walker DX: G20   aspirin EC 81 MG tablet Take 81 mg by mouth daily.   Carbidopa-Levodopa ER 25-100 MG tablet controlled release Commonly known as: SINEMET CR TAKE 2 TABS AT 7 AM, THEN 2 TABS AT 12PM, THEN 1 TABS AT 4PM, 1 TAB AT BEDTIME   cholecalciferol 1000 units tablet Commonly known as: VITAMIN D Take 1,000 Units by mouth daily.   colchicine 0.6 MG tablet TAKE 1 TABLET BY MOUTH TWICE A DAY AS NEEDED FOR GOUT   ferrous sulfate 325 (65 FE) MG EC tablet Take 325 mg by mouth daily.   furosemide 80 MG tablet Commonly known as: LASIX Take 1 tablet (80 mg total) by mouth 2 (two) times daily.   gabapentin 100 MG capsule Commonly known as: NEURONTIN TAKE 2 CAPSULES BY MOUTH EVERY MORNING AND TAKE 4 CAPSULES AT BEDTIME   glucose blood test strip Commonly known as: OneTouch Verio USE AS DIRECTED TO CHECK BLOOD  SUGAR TWICE A DAY. DX:E11.65   glucose blood test strip 1 each by Other route as needed for other. Use as instructed to check blood sugar 4 times daily.   HumaLOG 100  UNIT/ML injection Generic drug: insulin lispro USE MAXIMUM 76 UNITS PER   DAY WITH V-GO PUMP   Klor-Con M20 20 MEQ tablet Generic drug: potassium chloride SA TAKE 2 TABLETS (40 MEQ TOTAL) BY MOUTH DAILY.   levothyroxine 125 MCG tablet Commonly known as: SYNTHROID TAKE 1 TABLET BY MOUTH EVERY DAY BEFORE BREAKFAST   liraglutide 18 MG/3ML Sopn Commonly known as: Victoza Inject 0.3 mLs (1.8 mg total) into the skin daily.   lisinopril 2.5 MG tablet Commonly known as: ZESTRIL Take 1 tablet (2.5 mg total) by mouth daily.   metolazone 2.5 MG tablet Commonly known as: ZAROXOLYN TAKE 1 TABLET BY MOUTH ONCE A WEEK.   metoprolol succinate 50 MG 24 hr tablet Commonly known as: TOPROL-XL TAKE 1 TABLET BY MOUTH DAILY. TAKE WITH OR IMMEDIATELY FOLLOWING A MEAL.   multivitamin with minerals Tabs tablet Take 1 tablet by mouth daily.   Na Sulfate-K Sulfate-Mg Sulf 17.5-3.13-1.6 GM/177ML Soln 1 kit   OmniPod Dash System Kit 1 each by Does not apply route. Use Dash system for Continuous Blood Glucose Monitoring.   OVER THE COUNTER MEDICATION Apply 1 application topically at bedtime. Theraworx Pain Cream   polyethylene glycol 17 g packet Commonly known as: MIRALAX / GLYCOLAX Take 17 g by mouth daily as needed for moderate constipation.   rosuvastatin 20 MG tablet Commonly known as: CRESTOR TAKE 1 TABLET BY MOUTH EVERY DAY   traMADol 50 MG tablet Commonly known as: ULTRAM TAKE 1 TABLET BY MOUTH 2 TIMES DAILY AS NEEDED FOR MODERATE PAIN.   travoprost (benzalkonium) 0.004 % ophthalmic solution Commonly known as: TRAVATAN Place 1 drop into the right eye at bedtime.   vitamin B-12 1000 MCG tablet Commonly known as: CYANOCOBALAMIN Take 1,000 mcg by mouth daily.   Wixela Inhub 250-50 MCG/DOSE Aepb Generic drug:  Fluticasone-Salmeterol INHALE 1 PUFF INTO THE LUNGS 2 TIMES DAILY.       Allergies:  Allergies  Allergen Reactions  . Penicillins Rash and Other (See Comments)    WEARS ALLERGY BRACELET Because of a history of documented adverse serious drug reaction;Medi Alert bracelet  is recommended PATIENT HAS HAD A PCN REACTION WITH IMMEDIATE RASH, FACIAL/TONGUE/THROAT SWELLING, SOB, OR LIGHTHEADEDNESS WITH HYPOTENSION:  #  #  YES  #  #  Has patient had a PCN reaction causing severe rash involving mucus membranes or skin necrosis: unknown Has patient had a PCN reaction that required hospitalization NO Has patient had a PCN reaction occurring within the last 10 years: NO  . Klonopin [Clonazepam] Other (See Comments)    agitation  . Peanut-Containing Drug Products Cough  . Watermelon [Citrullus Vulgaris] Other (See Comments) and Cough    Tickle in throat    Past Medical History:  Diagnosis Date  . AICD (automatic cardioverter/defibrillator) present    Dr Lovena Le office visit yearly, MDT   . Arthritis    cane  . Asthma   . Benign neoplasm of colon   . CAD (coronary artery disease)   . Cardiomyopathy   . Cataract   . Complication of anesthesia    pt states that he got a rash  . Constipation   . 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 DM (diabetes mellitus) ([place])    TYPE 2   . GERD (gastroesophageal reflux disease)   . Glaucoma    right eye  . HLD (hyperlipidemia)   . HTN (hypertension)    pt denies 08/19/12  . Hyperplasia, prostate   .  Hyperthyroidism    thyroid lobectomy  . MI (myocardial infarction) [facility])    Dr Stanford Breed 2000, x3vessels bypass  . Nephrolithiasis   . OSA (obstructive sleep apnea)    AHI-28,on CPAP, noncompliant with CPAP  . Parkinson disease ([place])    1999  . PONV (postoperative nausea and vomiting)   . Restless legs   . Shortness of breath    Hx: of at all times  . Sleep apnea    does not use CPAP  . Sleep apnea, organic   . UTI  (lower urinary tract infection) 09/15/12   Klebsiella  . Ventral hernia     Past Surgical History:  Procedure Laterality Date  . 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 CARDIAC CATHETERIZATION    . CATARACT EXTRACTION W/ INTRAOCULAR LENS IMPLANT     Hx: of right eye  . CATARACT EXTRACTION W/ INTRAOCULAR LENS IMPLANT Left 2018  . COLONOSCOPY N/A 10/13/2014   Procedure: COLONOSCOPY;  Surgeon: Jerene Bears, MD;  Location: [facility] ENDOSCOPY;  Service: Gastroenterology;  Laterality: N/A;  . COLONOSCOPY W/ BIOPSIES AND POLYPECTOMY     Hx: of  . CORONARY ARTERY BYPASS GRAFT  2000   [name], MD  . [place]  . DEEP BRAIN STIMULATOR PLACEMENT  2004   Right and left VIN stimulator placement (parkinsons)  . EYE SURGERY    . FINGER AMPUTATION     left pointer  . IMPLANTABLE CARDIOVERTER DEFIBRILLATOR IMPLANT N/A 11/13/2013   Procedure: IMPLANTABLE CARDIOVERTER DEFIBRILLATOR IMPLANT;  Surgeon: Evans Lance, MD;  Location: [facility] CATH LAB;  Service: Cardiovascular;  Laterality: N/A;  . INSERT / REPLACE / REMOVE PACEMAKER    . LEFT AND RIGHT HEART CATHETERIZATION WITH CORONARY ANGIOGRAM N/A 09/24/2013   Procedure: LEFT AND RIGHT HEART CATHETERIZATION WITH CORONARY ANGIOGRAM;  Surgeon: Burnell Blanks, MD;  Location: [facility] CATH LAB;  Service: Cardiovascular;  Laterality: N/A;  . LITHOTRIPSY     3 different times  . MEDIAN STERNOTOMY  2000  . PULSE GENERATOR IMPLANT Right 11/13/2017   Procedure: Right chest implantable pulse generator change;  Surgeon: Erline Levine, MD;  Location: [place];  Service: Neurosurgery;  Laterality: Right;  Right chest implantable pulse generator change  . SUBTHALAMIC STIMULATOR BATTERY REPLACEMENT N/A 09/05/2012   Procedure: Deep brain stimulator battery change;  Surgeon: Erline Levine, MD;  Location: [place] NEURO ORS;  Service: Neurosurgery;  Laterality: N/A;  Deep brain stimulator battery change  . SUBTHALAMIC STIMULATOR BATTERY REPLACEMENT  N/A 06/10/2015   Procedure: Deep Brain stimulator battery change;  Surgeon: Erline Levine, MD;  Location: [place] NEURO ORS;  Service: Neurosurgery;  Laterality: N/A;  . TONSILLECTOMY      Family History  Problem Relation Age of Onset  . Aneurysm Mother   . Alcoholism Father   . HIV/AIDS Brother 49       AIDS  . Healthy Child   . Peripheral vascular disease Other   . Arthritis Other   . Healthy Child   . Healthy Child   . Diabetes Neg Hx   . Heart disease Neg Hx   . Colon cancer Neg Hx   . Prostate cancer Neg Hx     Social History:  reports that he has never smoked. He has never used smokeless tobacco. He reports previous alcohol use. He reports that he does not use drugs.    Review of Systems      HYPERLIPIDEMIA:  He is on Crestor 20  mg with good control of LDL, has low HDL Triglycerides are persistently over 200, treated with Lovaza 2 g twice a day No recent labs available      Lab Results  Component Value Date   CHOL 130 03/12/2018   CHOL 123 04/04/2017   CHOL 111 10/16/2016   Lab Results  Component Value Date   HDL 29.00 (L) 03/12/2018   HDL 32.30 (L) 04/04/2017   HDL 27.80 (L) 10/16/2016   Lab Results  Component Value Date   LDLCALC 118 (H) 09/16/2015   LDLCALC 126 (H) 04/08/2013   LDLCALC 51 02/28/2012   Lab Results  Component Value Date   TRIG 257.0 (H) 03/12/2018   TRIG 232.0 (H) 04/04/2017   TRIG 287.0 (H) 10/16/2016   Lab Results  Component Value Date   CHOLHDL 4 03/12/2018   CHOLHDL 4 04/04/2017   CHOLHDL 4 10/16/2016   Lab Results  Component Value Date   LDLDIRECT 67.0 03/12/2018   LDLDIRECT 61.0 04/04/2017   LDLDIRECT 52.0 10/16/2016                  Thyroid:   He has had hypothyroidism following radioactive iodine treatment for hyperthyroidism in 2013  TSH is  normal last on 125ug   Lab Results  Component Value Date   TSH 2.64 08/16/2018   TSH 3.51 03/12/2018   TSH 5.68 (H) 11/09/2017   FREET4 0.88 08/16/2018   FREET4 0.75  03/12/2018   FREET4 0.72 11/09/2017       HYPERTENSION has been followed by  other physicians   CKD: Has variable creatinine levels, recently high.  Does have a nephrologist   Lab Results  Component Value Date   CREATININE 1.94 (H) 08/16/2018   CREATININE 1.68 (H) 07/01/2018   CREATININE 2.31 (H) 06/14/2018    Neuropathy: Taking gabapentin low doses from his PCP. He thinks he has has some pain in his feet but also apparently does get relief with some steroid injection in his feet from the podiatrist at times Also has had some history of numbness in his toes  Diabetic foot exam in 04/2017 showed  normal monofilament sensation in the toes except decreased on the right great toe and plantar surfaces, no skin lesions or ulcers on the feet and normal pedal pulses    Physical Examination:  BP 120/60 (BP Location: Left Arm, Patient Position: Sitting, Cuff Size: Normal)   Pulse 94   Ht '5\' 9"'$  (1.753 m)   Wt 259 lb 9.6 oz (117.8 kg)   SpO2 96%   BMI 38.34 kg/m      ASSESSMENT/PLAN  Diabetes type 2, insulin-dependent with obesity     See history of present illness for detailed discussion of his current management, blood sugar patterns and problems identified  His A1c is still higher than target and was 8.3 last month  He is using the Omni pod insulin pump with the older PDM   He is not checking his blood sugars enough  Most of his blood sugars are high except sometimes in the mornings may be close to 100  He is currently taking a total of about 55 units insulin in his pump daily but likely needs higher doses  Although he does not do enough readings after meals he does appear to have mostly high readings after his meals  His breakfast is variable and likely is having higher readings in the mornings after breakfast when he does not eat eggs for protein  As before he  forgets to check his sugars later in the day and only sporadically after dinner  Has difficulty with  weight loss despite taking Victoza 1.8 mg  Discussed possibly trying Ozempic to see if it works better for him on the next prescription He will try to bolus at least 1 to 2 units more for every meal especially lunch and dinner by putting in extra 5 to 10 g carbohydrate at least Needs to do blood sugar readings after meals on a regular basis at least after dinner  He will have a higher basal rate of 1.85 until 4:30 PM in the daytime and 2.15 between 4:30 PM and midnight Changes were made in the pump for the patient today Bolus for all snacks with 2 to 3 units  Hypothyroidism: Consistently well controlled on levothyroxine 125  Lipids: Needs follow-up labs, is not fasting today but had his last meal about 6 hours ago with eggs  Follow-up in 3 months  Influenza vaccine given  Patient Instructions  Lunch carbs 55-60 and dinner 75-80 , add 10 more carbs for oatmeal  Check blood sugars on waking up 3-4 days a week  Also check blood sugars about 2 hours after meals and do this after different meals by rotation  Recommended blood sugar levels on waking up are 90-130 and about 2 hours after meal is 130-160  Please bring your blood sugar monitor to each visit, thank you    Call before refilling Victoza    Counseling time on subjects discussed in assessment and plan sections is over 50% of today's 25 minute visit     Elayne Snare 09/18/2018, 2:43 PM   Note: This office note was prepared with Dragon voice recognition system technology. Any transcriptional errors that result from this process are unintentional.

## 2018-09-19 ENCOUNTER — Other Ambulatory Visit: Payer: Self-pay | Admitting: Family Medicine

## 2018-09-19 ENCOUNTER — Ambulatory Visit (INDEPENDENT_AMBULATORY_CARE_PROVIDER_SITE_OTHER): Payer: Medicare Other | Admitting: Neurology

## 2018-09-19 ENCOUNTER — Other Ambulatory Visit: Payer: Self-pay

## 2018-09-19 ENCOUNTER — Encounter: Payer: Self-pay | Admitting: Neurology

## 2018-09-19 VITALS — BP 111/73 | HR 73 | Ht 69.0 in | Wt 260.6 lb

## 2018-09-19 DIAGNOSIS — G20 Parkinson's disease: Secondary | ICD-10-CM

## 2018-09-19 DIAGNOSIS — R296 Repeated falls: Secondary | ICD-10-CM | POA: Diagnosis not present

## 2018-09-19 DIAGNOSIS — I255 Ischemic cardiomyopathy: Secondary | ICD-10-CM | POA: Diagnosis not present

## 2018-09-19 DIAGNOSIS — G4752 REM sleep behavior disorder: Secondary | ICD-10-CM | POA: Diagnosis not present

## 2018-09-19 DIAGNOSIS — G500 Trigeminal neuralgia: Secondary | ICD-10-CM

## 2018-09-19 MED ORDER — CARBIDOPA-LEVODOPA ER 25-100 MG PO TBCR: EXTENDED_RELEASE_TABLET | ORAL | 1 refills | Status: DC

## 2018-09-19 NOTE — Patient Instructions (Addendum)
1.  Take carbidopa/levodopa 25/100 CR as follows: 2 tablets at 7am/2 tablets at 11am/2 at 3pm/1 at 7pm/1 at bedtime 2.  Keep a regular daily schedule to avoid day/night reversal 3. A referral has been place for PT at [facility] someone will be calling you within the next 2-3 days to set up home visit.

## 2018-09-19 NOTE — Procedures (Signed)
DBS Programming was performed.    Total time spent programming was 7 minutes.  Device was confirmed to be on.  Soft start was confirmed to be on.  Impedences were checked and were within normal limits.  Battery was checked and was not at end of life (2.89).  Detailed analysis on separate neurophysiologic worksheet.    Final settings were as follows:  Left brain electrode:     1-2+           ; Amplitude  4.4   V   ; Pulse width 90 microseconds;   Frequency   170   Hz.  Right brain electrode:     4-7+          ; Amplitude   4.5  V ;  Pulse width 90  microseconds;  Frequency   170    Hz. (had hand pull on the left with V of 4.6)

## 2018-09-24 ENCOUNTER — Telehealth: Payer: Self-pay

## 2018-09-24 DIAGNOSIS — E785 Hyperlipidemia, unspecified: Secondary | ICD-10-CM | POA: Diagnosis not present

## 2018-09-24 DIAGNOSIS — J45909 Unspecified asthma, uncomplicated: Secondary | ICD-10-CM | POA: Diagnosis not present

## 2018-09-24 DIAGNOSIS — I110 Hypertensive heart disease with heart failure: Secondary | ICD-10-CM | POA: Diagnosis not present

## 2018-09-24 DIAGNOSIS — M1990 Unspecified osteoarthritis, unspecified site: Secondary | ICD-10-CM | POA: Diagnosis not present

## 2018-09-24 DIAGNOSIS — E039 Hypothyroidism, unspecified: Secondary | ICD-10-CM | POA: Diagnosis not present

## 2018-09-24 DIAGNOSIS — I509 Heart failure, unspecified: Secondary | ICD-10-CM | POA: Diagnosis not present

## 2018-09-24 DIAGNOSIS — E119 Type 2 diabetes mellitus without complications: Secondary | ICD-10-CM | POA: Diagnosis not present

## 2018-09-24 DIAGNOSIS — Z95810 Presence of automatic (implantable) cardiac defibrillator: Secondary | ICD-10-CM | POA: Diagnosis not present

## 2018-09-24 DIAGNOSIS — R296 Repeated falls: Secondary | ICD-10-CM | POA: Diagnosis not present

## 2018-09-24 DIAGNOSIS — G20 Parkinson's disease: Secondary | ICD-10-CM | POA: Diagnosis not present

## 2018-09-24 DIAGNOSIS — Z7982 Long term (current) use of aspirin: Secondary | ICD-10-CM | POA: Diagnosis not present

## 2018-09-24 DIAGNOSIS — I2510 Atherosclerotic heart disease of native coronary artery without angina pectoris: Secondary | ICD-10-CM | POA: Diagnosis not present

## 2018-09-24 DIAGNOSIS — K219 Gastro-esophageal reflux disease without esophagitis: Secondary | ICD-10-CM | POA: Diagnosis not present

## 2018-09-24 NOTE — Telephone Encounter (Signed)
We do not see any insulin pump letter or any faxed form. He will need to reduce his basal rate settings on his pump by  25% for 24 hours prior to his procedure time. His wife will need to be instructed by our nurse educator to do so

## 2018-09-24 NOTE — Telephone Encounter (Signed)
Henry [name], PT @ [facility] Calling requesting verbal for BIG program 2 x 1 week  4 x 1 week 3 x 4 weeks 1 x 1 week  Verbal order given for PT BIG PROGRAM  Also requesting OT evaluation for AOD (activity of daily living safety)   Verbal given for OT

## 2018-09-25 NOTE — Telephone Encounter (Signed)
Insulin pump letter is at the bottom of this phone note  Called patient and spoke to him and his wife Henry [name] about adjusting the insulin pump 24 hours before procedure.. And to contact Dr Jodelle Green office to get in touch with the nurse educator about adjusting the pump  They will call them to get instructions on how to set

## 2018-09-27 ENCOUNTER — Other Ambulatory Visit: Payer: Self-pay | Admitting: Family Medicine

## 2018-09-27 NOTE — Telephone Encounter (Signed)
Electronic refill Tramadol Last refill 06/25/18 #60/2 Last office visit 08/23/18

## 2018-09-29 NOTE — Telephone Encounter (Signed)
Sent. Thanks.   

## 2018-09-30 DIAGNOSIS — E119 Type 2 diabetes mellitus without complications: Secondary | ICD-10-CM | POA: Diagnosis not present

## 2018-09-30 DIAGNOSIS — G20 Parkinson's disease: Secondary | ICD-10-CM | POA: Diagnosis not present

## 2018-09-30 DIAGNOSIS — I2510 Atherosclerotic heart disease of native coronary artery without angina pectoris: Secondary | ICD-10-CM | POA: Diagnosis not present

## 2018-09-30 DIAGNOSIS — I509 Heart failure, unspecified: Secondary | ICD-10-CM | POA: Diagnosis not present

## 2018-09-30 DIAGNOSIS — R296 Repeated falls: Secondary | ICD-10-CM | POA: Diagnosis not present

## 2018-09-30 DIAGNOSIS — I110 Hypertensive heart disease with heart failure: Secondary | ICD-10-CM | POA: Diagnosis not present

## 2018-09-30 NOTE — Telephone Encounter (Signed)
Message left to call me for training on how to reduce the basal rate.

## 2018-10-01 ENCOUNTER — Telehealth: Payer: Self-pay | Admitting: Neurology

## 2018-10-01 DIAGNOSIS — G20 Parkinson's disease: Secondary | ICD-10-CM | POA: Diagnosis not present

## 2018-10-01 DIAGNOSIS — I2510 Atherosclerotic heart disease of native coronary artery without angina pectoris: Secondary | ICD-10-CM | POA: Diagnosis not present

## 2018-10-01 DIAGNOSIS — R296 Repeated falls: Secondary | ICD-10-CM | POA: Diagnosis not present

## 2018-10-01 DIAGNOSIS — E119 Type 2 diabetes mellitus without complications: Secondary | ICD-10-CM | POA: Diagnosis not present

## 2018-10-01 DIAGNOSIS — I509 Heart failure, unspecified: Secondary | ICD-10-CM | POA: Diagnosis not present

## 2018-10-01 DIAGNOSIS — I110 Hypertensive heart disease with heart failure: Secondary | ICD-10-CM | POA: Diagnosis not present

## 2018-10-01 NOTE — Telephone Encounter (Signed)
Called spoke with Sharyn Lull verbal orders approval given

## 2018-10-01 NOTE — Telephone Encounter (Signed)
Gave Mrs. Simerson the directions on how to put the pump in a 25% basal reduction mode for 24 hours before his colonoscopy.  She wrote down the steps and read them back to me correctly.  She had no final questions.

## 2018-10-01 NOTE — Telephone Encounter (Signed)
The following message was left with [employer] on 10/01/2018 at 12:47 PM.   Sharyn Lull, OT with [facility] says she finished the OT evaluation on this patient. She now needs to get a verbal order for 2 x a week for 4 weeks and 1 x a week for 1 week.

## 2018-10-02 ENCOUNTER — Encounter: Payer: Medicare Other | Admitting: Internal Medicine

## 2018-10-02 DIAGNOSIS — I509 Heart failure, unspecified: Secondary | ICD-10-CM | POA: Diagnosis not present

## 2018-10-02 DIAGNOSIS — E119 Type 2 diabetes mellitus without complications: Secondary | ICD-10-CM | POA: Diagnosis not present

## 2018-10-02 DIAGNOSIS — R296 Repeated falls: Secondary | ICD-10-CM | POA: Diagnosis not present

## 2018-10-02 DIAGNOSIS — I2510 Atherosclerotic heart disease of native coronary artery without angina pectoris: Secondary | ICD-10-CM | POA: Diagnosis not present

## 2018-10-02 DIAGNOSIS — G20 Parkinson's disease: Secondary | ICD-10-CM | POA: Diagnosis not present

## 2018-10-02 DIAGNOSIS — I110 Hypertensive heart disease with heart failure: Secondary | ICD-10-CM | POA: Diagnosis not present

## 2018-10-03 DIAGNOSIS — I509 Heart failure, unspecified: Secondary | ICD-10-CM | POA: Diagnosis not present

## 2018-10-03 DIAGNOSIS — I110 Hypertensive heart disease with heart failure: Secondary | ICD-10-CM | POA: Diagnosis not present

## 2018-10-03 DIAGNOSIS — E119 Type 2 diabetes mellitus without complications: Secondary | ICD-10-CM | POA: Diagnosis not present

## 2018-10-03 DIAGNOSIS — G20 Parkinson's disease: Secondary | ICD-10-CM | POA: Diagnosis not present

## 2018-10-03 DIAGNOSIS — R296 Repeated falls: Secondary | ICD-10-CM | POA: Diagnosis not present

## 2018-10-03 DIAGNOSIS — I2510 Atherosclerotic heart disease of native coronary artery without angina pectoris: Secondary | ICD-10-CM | POA: Diagnosis not present

## 2018-10-07 ENCOUNTER — Other Ambulatory Visit: Payer: Self-pay

## 2018-10-07 DIAGNOSIS — E119 Type 2 diabetes mellitus without complications: Secondary | ICD-10-CM | POA: Diagnosis not present

## 2018-10-07 DIAGNOSIS — I509 Heart failure, unspecified: Secondary | ICD-10-CM | POA: Diagnosis not present

## 2018-10-07 DIAGNOSIS — R296 Repeated falls: Secondary | ICD-10-CM | POA: Diagnosis not present

## 2018-10-07 DIAGNOSIS — I110 Hypertensive heart disease with heart failure: Secondary | ICD-10-CM | POA: Diagnosis not present

## 2018-10-07 DIAGNOSIS — I2510 Atherosclerotic heart disease of native coronary artery without angina pectoris: Secondary | ICD-10-CM | POA: Diagnosis not present

## 2018-10-07 DIAGNOSIS — G20 Parkinson's disease: Secondary | ICD-10-CM | POA: Diagnosis not present

## 2018-10-07 MED ORDER — OMEGA-3-ACID ETHYL ESTERS 1 G PO CAPS: 2.0000 g | ORAL_CAPSULE | Freq: Two times a day (BID) | ORAL | 3 refills | Status: DC

## 2018-10-08 ENCOUNTER — Encounter (HOSPITAL_COMMUNITY): Payer: Self-pay | Admitting: *Deleted

## 2018-10-08 ENCOUNTER — Other Ambulatory Visit: Payer: Self-pay

## 2018-10-08 DIAGNOSIS — G20 Parkinson's disease: Secondary | ICD-10-CM | POA: Diagnosis not present

## 2018-10-08 DIAGNOSIS — I110 Hypertensive heart disease with heart failure: Secondary | ICD-10-CM | POA: Diagnosis not present

## 2018-10-08 DIAGNOSIS — R296 Repeated falls: Secondary | ICD-10-CM | POA: Diagnosis not present

## 2018-10-08 DIAGNOSIS — I509 Heart failure, unspecified: Secondary | ICD-10-CM | POA: Diagnosis not present

## 2018-10-08 DIAGNOSIS — E119 Type 2 diabetes mellitus without complications: Secondary | ICD-10-CM | POA: Diagnosis not present

## 2018-10-08 DIAGNOSIS — I2510 Atherosclerotic heart disease of native coronary artery without angina pectoris: Secondary | ICD-10-CM | POA: Diagnosis not present

## 2018-10-08 NOTE — Progress Notes (Signed)
Reviewed chart with with Konrad Felix PA.

## 2018-10-08 NOTE — Progress Notes (Addendum)
Mr. Sutter requested I speak with his wife for endoscopy preop call. He has an insulin pump and she states she received instructions from GI office on adjusting basal rate. Verbalized understanding of bowel prep instructions provided by Dr Vena Rua office. He has sleep apnea, but stated his mask made him claustrophobic. Wife states they no longer have mask and tubing.  Patient also has a deep brain stimulator and pacemaker/AICD. Device Programming Form faxed to [facility].

## 2018-10-09 DIAGNOSIS — I110 Hypertensive heart disease with heart failure: Secondary | ICD-10-CM | POA: Diagnosis not present

## 2018-10-09 DIAGNOSIS — E119 Type 2 diabetes mellitus without complications: Secondary | ICD-10-CM | POA: Diagnosis not present

## 2018-10-09 DIAGNOSIS — G20 Parkinson's disease: Secondary | ICD-10-CM | POA: Diagnosis not present

## 2018-10-09 DIAGNOSIS — R296 Repeated falls: Secondary | ICD-10-CM | POA: Diagnosis not present

## 2018-10-09 DIAGNOSIS — I2510 Atherosclerotic heart disease of native coronary artery without angina pectoris: Secondary | ICD-10-CM | POA: Diagnosis not present

## 2018-10-09 DIAGNOSIS — I509 Heart failure, unspecified: Secondary | ICD-10-CM | POA: Diagnosis not present

## 2018-10-09 NOTE — Progress Notes (Signed)
Addendum: Reviewed and agree with assessment and management plan. Ariauna Farabee M, MD  

## 2018-10-10 ENCOUNTER — Other Ambulatory Visit (HOSPITAL_COMMUNITY)
Admission: RE | Admit: 2018-10-10 | Discharge: 2018-10-10 | Disposition: A | Discharge status: Home / Self Care | Payer: Medicare Other | Source: Ambulatory Visit | Attending: Internal Medicine | Admitting: Internal Medicine

## 2018-10-10 DIAGNOSIS — I130 Hypertensive heart and chronic kidney disease with heart failure and stage 1 through stage 4 chronic kidney disease, or unspecified chronic kidney disease: Secondary | ICD-10-CM | POA: Diagnosis not present

## 2018-10-10 DIAGNOSIS — K621 Rectal polyp: Secondary | ICD-10-CM | POA: Diagnosis not present

## 2018-10-10 DIAGNOSIS — D122 Benign neoplasm of ascending colon: Secondary | ICD-10-CM | POA: Diagnosis not present

## 2018-10-10 DIAGNOSIS — N189 Chronic kidney disease, unspecified: Secondary | ICD-10-CM | POA: Diagnosis not present

## 2018-10-10 DIAGNOSIS — G4733 Obstructive sleep apnea (adult) (pediatric): Secondary | ICD-10-CM | POA: Diagnosis not present

## 2018-10-10 DIAGNOSIS — Z95810 Presence of automatic (implantable) cardiac defibrillator: Secondary | ICD-10-CM | POA: Diagnosis not present

## 2018-10-10 DIAGNOSIS — E1122 Type 2 diabetes mellitus with diabetic chronic kidney disease: Secondary | ICD-10-CM | POA: Diagnosis not present

## 2018-10-10 DIAGNOSIS — H409 Unspecified glaucoma: Secondary | ICD-10-CM | POA: Diagnosis not present

## 2018-10-10 DIAGNOSIS — D120 Benign neoplasm of cecum: Secondary | ICD-10-CM | POA: Diagnosis not present

## 2018-10-10 DIAGNOSIS — J45909 Unspecified asthma, uncomplicated: Secondary | ICD-10-CM | POA: Diagnosis not present

## 2018-10-10 DIAGNOSIS — Z86010 Personal history of colonic polyps: Secondary | ICD-10-CM | POA: Diagnosis not present

## 2018-10-10 DIAGNOSIS — M1990 Unspecified osteoarthritis, unspecified site: Secondary | ICD-10-CM | POA: Diagnosis not present

## 2018-10-10 DIAGNOSIS — D123 Benign neoplasm of transverse colon: Secondary | ICD-10-CM | POA: Diagnosis not present

## 2018-10-10 DIAGNOSIS — G20 Parkinson's disease: Secondary | ICD-10-CM | POA: Diagnosis not present

## 2018-10-10 DIAGNOSIS — Z20828 Contact with and (suspected) exposure to other viral communicable diseases: Secondary | ICD-10-CM | POA: Diagnosis not present

## 2018-10-10 DIAGNOSIS — Z9842 Cataract extraction status, left eye: Secondary | ICD-10-CM | POA: Diagnosis not present

## 2018-10-10 DIAGNOSIS — I2510 Atherosclerotic heart disease of native coronary artery without angina pectoris: Secondary | ICD-10-CM | POA: Diagnosis not present

## 2018-10-10 DIAGNOSIS — I252 Old myocardial infarction: Secondary | ICD-10-CM | POA: Diagnosis not present

## 2018-10-10 DIAGNOSIS — E119 Type 2 diabetes mellitus without complications: Secondary | ICD-10-CM | POA: Diagnosis not present

## 2018-10-10 DIAGNOSIS — Z955 Presence of coronary angioplasty implant and graft: Secondary | ICD-10-CM | POA: Diagnosis not present

## 2018-10-10 DIAGNOSIS — Z1211 Encounter for screening for malignant neoplasm of colon: Secondary | ICD-10-CM | POA: Diagnosis not present

## 2018-10-10 DIAGNOSIS — R296 Repeated falls: Secondary | ICD-10-CM | POA: Diagnosis not present

## 2018-10-10 DIAGNOSIS — I110 Hypertensive heart disease with heart failure: Secondary | ICD-10-CM | POA: Diagnosis not present

## 2018-10-10 DIAGNOSIS — Z951 Presence of aortocoronary bypass graft: Secondary | ICD-10-CM | POA: Diagnosis not present

## 2018-10-10 DIAGNOSIS — I255 Ischemic cardiomyopathy: Secondary | ICD-10-CM | POA: Diagnosis not present

## 2018-10-10 DIAGNOSIS — I509 Heart failure, unspecified: Secondary | ICD-10-CM | POA: Diagnosis not present

## 2018-10-10 DIAGNOSIS — K5900 Constipation, unspecified: Secondary | ICD-10-CM | POA: Diagnosis not present

## 2018-10-10 NOTE — Telephone Encounter (Signed)
Sending to clinical staff for review: Okay to sign/close encounter or is further follow up needed? ° °

## 2018-10-11 DIAGNOSIS — I110 Hypertensive heart disease with heart failure: Secondary | ICD-10-CM | POA: Diagnosis not present

## 2018-10-11 DIAGNOSIS — R296 Repeated falls: Secondary | ICD-10-CM | POA: Diagnosis not present

## 2018-10-11 DIAGNOSIS — I509 Heart failure, unspecified: Secondary | ICD-10-CM | POA: Diagnosis not present

## 2018-10-11 DIAGNOSIS — E119 Type 2 diabetes mellitus without complications: Secondary | ICD-10-CM | POA: Diagnosis not present

## 2018-10-11 DIAGNOSIS — G20 Parkinson's disease: Secondary | ICD-10-CM | POA: Diagnosis not present

## 2018-10-11 DIAGNOSIS — I2510 Atherosclerotic heart disease of native coronary artery without angina pectoris: Secondary | ICD-10-CM | POA: Diagnosis not present

## 2018-10-11 LAB — NOVEL CORONAVIRUS, NAA (HOSP ORDER, SEND-OUT TO REF LAB; TAT 18-24 HRS): SARS-CoV-2, NAA: NOT DETECTED

## 2018-10-14 ENCOUNTER — Ambulatory Visit (HOSPITAL_COMMUNITY)
Admission: RE | Admit: 2018-10-14 | Discharge: 2018-10-14 | Disposition: A | Discharge status: Home / Self Care | Payer: Medicare Other | Attending: Internal Medicine | Admitting: Internal Medicine

## 2018-10-14 ENCOUNTER — Encounter (HOSPITAL_COMMUNITY)
Admission: RE | Disposition: A | Discharge status: Home / Self Care | Payer: Self-pay | Source: Home / Self Care | Attending: Internal Medicine

## 2018-10-14 ENCOUNTER — Other Ambulatory Visit: Payer: Self-pay

## 2018-10-14 ENCOUNTER — Ambulatory Visit (HOSPITAL_COMMUNITY): Payer: Medicare Other | Admitting: Physician Assistant

## 2018-10-14 ENCOUNTER — Encounter (HOSPITAL_COMMUNITY): Payer: Self-pay | Admitting: *Deleted

## 2018-10-14 DIAGNOSIS — Z86010 Personal history of colonic polyps: Secondary | ICD-10-CM | POA: Diagnosis not present

## 2018-10-14 DIAGNOSIS — N189 Chronic kidney disease, unspecified: Secondary | ICD-10-CM | POA: Insufficient documentation

## 2018-10-14 DIAGNOSIS — K5900 Constipation, unspecified: Secondary | ICD-10-CM | POA: Diagnosis not present

## 2018-10-14 DIAGNOSIS — K635 Polyp of colon: Secondary | ICD-10-CM | POA: Diagnosis not present

## 2018-10-14 DIAGNOSIS — Z880 Allergy status to penicillin: Secondary | ICD-10-CM | POA: Insufficient documentation

## 2018-10-14 DIAGNOSIS — E1122 Type 2 diabetes mellitus with diabetic chronic kidney disease: Secondary | ICD-10-CM | POA: Insufficient documentation

## 2018-10-14 DIAGNOSIS — I428 Other cardiomyopathies: Secondary | ICD-10-CM

## 2018-10-14 DIAGNOSIS — Z955 Presence of coronary angioplasty implant and graft: Secondary | ICD-10-CM | POA: Insufficient documentation

## 2018-10-14 DIAGNOSIS — D123 Benign neoplasm of transverse colon: Secondary | ICD-10-CM | POA: Diagnosis not present

## 2018-10-14 DIAGNOSIS — Z1211 Encounter for screening for malignant neoplasm of colon: Secondary | ICD-10-CM | POA: Diagnosis not present

## 2018-10-14 DIAGNOSIS — G20 Parkinson's disease: Secondary | ICD-10-CM | POA: Insufficient documentation

## 2018-10-14 DIAGNOSIS — Z961 Presence of intraocular lens: Secondary | ICD-10-CM | POA: Insufficient documentation

## 2018-10-14 DIAGNOSIS — Z95810 Presence of automatic (implantable) cardiac defibrillator: Secondary | ICD-10-CM | POA: Insufficient documentation

## 2018-10-14 DIAGNOSIS — Z20828 Contact with and (suspected) exposure to other viral communicable diseases: Secondary | ICD-10-CM | POA: Diagnosis not present

## 2018-10-14 DIAGNOSIS — I130 Hypertensive heart and chronic kidney disease with heart failure and stage 1 through stage 4 chronic kidney disease, or unspecified chronic kidney disease: Secondary | ICD-10-CM | POA: Diagnosis not present

## 2018-10-14 DIAGNOSIS — I2510 Atherosclerotic heart disease of native coronary artery without angina pectoris: Secondary | ICD-10-CM | POA: Insufficient documentation

## 2018-10-14 DIAGNOSIS — D122 Benign neoplasm of ascending colon: Secondary | ICD-10-CM | POA: Diagnosis not present

## 2018-10-14 DIAGNOSIS — K621 Rectal polyp: Secondary | ICD-10-CM | POA: Diagnosis not present

## 2018-10-14 DIAGNOSIS — I509 Heart failure, unspecified: Secondary | ICD-10-CM | POA: Diagnosis not present

## 2018-10-14 DIAGNOSIS — Z888 Allergy status to other drugs, medicaments and biological substances status: Secondary | ICD-10-CM | POA: Insufficient documentation

## 2018-10-14 DIAGNOSIS — J45909 Unspecified asthma, uncomplicated: Secondary | ICD-10-CM | POA: Insufficient documentation

## 2018-10-14 DIAGNOSIS — Z91018 Allergy to other foods: Secondary | ICD-10-CM | POA: Insufficient documentation

## 2018-10-14 DIAGNOSIS — I255 Ischemic cardiomyopathy: Secondary | ICD-10-CM | POA: Insufficient documentation

## 2018-10-14 DIAGNOSIS — H409 Unspecified glaucoma: Secondary | ICD-10-CM | POA: Insufficient documentation

## 2018-10-14 DIAGNOSIS — G4733 Obstructive sleep apnea (adult) (pediatric): Secondary | ICD-10-CM | POA: Insufficient documentation

## 2018-10-14 DIAGNOSIS — I252 Old myocardial infarction: Secondary | ICD-10-CM | POA: Insufficient documentation

## 2018-10-14 DIAGNOSIS — Z951 Presence of aortocoronary bypass graft: Secondary | ICD-10-CM | POA: Insufficient documentation

## 2018-10-14 DIAGNOSIS — D120 Benign neoplasm of cecum: Secondary | ICD-10-CM | POA: Diagnosis not present

## 2018-10-14 DIAGNOSIS — Z9841 Cataract extraction status, right eye: Secondary | ICD-10-CM | POA: Insufficient documentation

## 2018-10-14 DIAGNOSIS — IMO0001 Reserved for inherently not codable concepts without codable children: Secondary | ICD-10-CM

## 2018-10-14 DIAGNOSIS — M1990 Unspecified osteoarthritis, unspecified site: Secondary | ICD-10-CM | POA: Insufficient documentation

## 2018-10-14 DIAGNOSIS — Z9842 Cataract extraction status, left eye: Secondary | ICD-10-CM | POA: Insufficient documentation

## 2018-10-14 HISTORY — PX: POLYPECTOMY: SHX5525

## 2018-10-14 HISTORY — PX: COLONOSCOPY WITH PROPOFOL: SHX5780

## 2018-10-14 HISTORY — PX: BIOPSY: SHX5522

## 2018-10-14 LAB — GLUCOSE, CAPILLARY
Glucose-Capillary: 146 mg/dL — ABNORMAL HIGH (ref 70–99)
Glucose-Capillary: 129 mg/dL — ABNORMAL HIGH (ref 70–99)

## 2018-10-14 SURGERY — COLONOSCOPY WITH PROPOFOL
Anesthesia: Monitor Anesthesia Care

## 2018-10-14 MED ORDER — PROPOFOL 10 MG/ML IV BOLUS
INTRAVENOUS | Status: AC
  Filled 2018-10-14: qty 40

## 2018-10-14 MED ORDER — PHENYLEPHRINE 40 MCG/ML (10ML) SYRINGE FOR IV PUSH (FOR BLOOD PRESSURE SUPPORT)
PREFILLED_SYRINGE | INTRAVENOUS | Status: DC | PRN
  Administered 2018-10-14: 80 ug via INTRAVENOUS

## 2018-10-14 MED ORDER — LIDOCAINE 2% (20 MG/ML) 5 ML SYRINGE
INTRAMUSCULAR | Status: DC | PRN
  Administered 2018-10-14: 100 mg via INTRAVENOUS

## 2018-10-14 MED ORDER — PROPOFOL 500 MG/50ML IV EMUL
INTRAVENOUS | Status: DC | PRN
  Administered 2018-10-14: 100 ug/kg/min via INTRAVENOUS

## 2018-10-14 MED ORDER — PROPOFOL 10 MG/ML IV BOLUS
INTRAVENOUS | Status: DC | PRN
  Administered 2018-10-14 (×3): 20 mg via INTRAVENOUS

## 2018-10-14 MED ORDER — SODIUM CHLORIDE 0.9 % IV SOLN
INTRAVENOUS | Status: DC
  Administered 2018-10-14: 500 mL via INTRAVENOUS

## 2018-10-14 SURGICAL SUPPLY — 22 items

## 2018-10-14 NOTE — Op Note (Signed)
[facility] Patient Name: Henry [name] Procedure Date: 10/14/2018 MRN: [id number] Attending MD: Jerene Bears , MD Date of Birth: 19-Jun-1950 CSN: [id number] Age: 68 Admit Type: Outpatient Procedure:                Colonoscopy Indications:              High risk colon cancer surveillance: Personal                            history of multiple (3 or more) adenomas, High risk                            colon cancer surveillance: Personal history of                            sessile serrated colon polyp (less than 10 mm in                            size) with no dysplasia, Last colonoscopy: April                            2016 Providers:                Lajuan Lines. Hilarie Fredrickson, MD, Jeanella Cara, RN,                            Marguerita Merles, Technician, Lazaro Arms, Technician Referring MD:             Elveria Rising. Damita Dunnings, MD Medicines:                Monitored Anesthesia Care Complications:            No immediate complications. Estimated Blood Loss:     Estimated blood loss was minimal. Procedure:                Pre-Anesthesia Assessment:                           - Prior to the procedure, a History and Physical                            was performed, and patient medications and                            allergies were reviewed. The patient's tolerance of                            previous anesthesia was also reviewed. The risks                            and benefits of the procedure and the sedation                            options and risks were discussed with the patient.  All questions were answered, and informed consent                            was obtained. Prior Anticoagulants: The patient has                            taken no previous anticoagulant or antiplatelet                            agents. ASA Grade Assessment: II - A patient with                            mild systemic disease. After reviewing the risks                   and benefits, the patient was deemed in                            satisfactory condition to undergo the procedure.                           After obtaining informed consent, the colonoscope                            was passed under direct vision. Throughout the                            procedure, the patient's blood pressure, pulse, and                            oxygen saturations were monitored continuously. The                            [id number] Olympus colonoscope was                            introduced through the anus and advanced to the                            cecum, identified by appendiceal orifice and                            ileocecal valve. The colonoscopy was performed                            without difficulty. The patient tolerated the                            procedure well. The quality of the bowel                            preparation was good. The ileocecal valve,                            appendiceal orifice, and rectum were  photographed. Scope In: 11:23:59 AM Scope Out: 11:45:17 AM Scope Withdrawal Time: 0 hours 17 minutes 53 seconds  Total Procedure Duration: 0 hours 21 minutes 18 seconds  Findings:      A 2 mm polyp was found in the cecum. The polyp was sessile. The polyp       was removed with a cold biopsy forceps. Resection and retrieval were       complete.      A 5 mm polyp was found in the cecum. The polyp was sessile. The polyp       was removed with a cold snare. Resection and retrieval were complete.      A 4 mm polyp was found in the ascending colon. The polyp was sessile.       The polyp was removed with a cold snare. Resection and retrieval were       complete.      A 7 mm polyp was found in the transverse colon. The polyp was sessile.       The polyp was removed with a cold snare. Resection and retrieval were       complete.      A 3 mm polyp was found in the rectum. The polyp was sessile. The polyp        was removed with a cold snare. Resection and retrieval were complete.      The exam was otherwise without abnormality on direct and retroflexion       views. Impression:               - One 2 mm polyp in the cecum, removed with a cold                            biopsy forceps. Resected and retrieved.                           - One 5 mm polyp in the cecum, removed with a cold                            snare. Resected and retrieved.                           - One 4 mm polyp in the ascending colon, removed                            with a cold snare. Resected and retrieved.                           - One 7 mm polyp in the transverse colon, removed                            with a cold snare. Resected and retrieved.                           - One 3 mm polyp in the rectum, removed with a cold                            snare. Resected and retrieved.                           -  The examination was otherwise normal on direct                            and retroflexion views. Moderate Sedation:      N/A Recommendation:           - Patient has a contact number available for                            emergencies. The signs and symptoms of potential                            delayed complications were discussed with the                            patient. Return to normal activities tomorrow.                            Written discharge instructions were provided to the                            patient.                           - Resume previous diet.                           - Continue present medications.                           - Await pathology results.                           - Repeat colonoscopy is recommended for                            surveillance. The colonoscopy date will be                            determined after pathology results from today's                            exam become available for review. Procedure Code(s):        --- Professional ---                            [phone], Colonoscopy, flexible; with removal of                            tumor(s), polyp(s), or other lesion(s) by snare                            technique                           [id number], 3, Colonoscopy, flexible; with biopsy,  single or multiple Diagnosis Code(s):        --- Professional ---                           K63.5, Polyp of colon                           K62.1, Rectal polyp                           Z86.010, Personal history of colonic polyps CPT copyright 2019 [employer]. All rights reserved. The codes documented in this report are preliminary and upon coder review may  be revised to meet current compliance requirements. Jerene Bears, MD 10/14/2018 11:49:41 AM This report has been signed electronically. Number of Addenda: 0

## 2018-10-14 NOTE — Discharge Instructions (Signed)
YOU HAD AN ENDOSCOPIC PROCEDURE TODAY: Refer to the procedure report and other information in the discharge instructions given to you for any specific questions about what was found during the examination. If this information does not answer your questions, please call [place] office at [phone] to clarify.  ° °YOU SHOULD EXPECT: Some feelings of bloating in the abdomen. Passage of more gas than usual. Walking can help get rid of the air that was put into your GI tract during the procedure and reduce the bloating. If you had a lower endoscopy (such as a colonoscopy or flexible sigmoidoscopy) you may notice spotting of blood in your stool or on the toilet paper. Some abdominal soreness may be present for a day or two, also. ° °DIET: Your first meal following the procedure should be a light meal and then it is ok to progress to your normal diet. A half-sandwich or bowl of soup is an example of a good first meal. Heavy or fried foods are harder to digest and may make you feel nauseous or bloated. Drink plenty of fluids but you should avoid alcoholic beverages for 24 hours. If you had a esophageal dilation, please see attached instructions for diet.   ° °ACTIVITY: Your care partner should take you home directly after the procedure. You should plan to take it easy, moving slowly for the rest of the day. You can resume normal activity the day after the procedure however YOU SHOULD NOT DRIVE, use power tools, machinery or perform tasks that involve climbing or major physical exertion for 24 hours (because of the sedation medicines used during the test).  ° °SYMPTOMS TO REPORT IMMEDIATELY: °A gastroenterologist can be reached at any hour. Please call [phone]  for any of the following symptoms:  °Following lower endoscopy (colonoscopy, flexible sigmoidoscopy) °Excessive amounts of blood in the stool  °Significant tenderness, worsening of abdominal pains  °Swelling of the abdomen that is new, acute  °Fever of 100° or  higher  °Following upper endoscopy (EGD, EUS, ERCP, esophageal dilation) °Vomiting of blood or coffee ground material  °New, significant abdominal pain  °New, significant chest pain or pain under the shoulder blades  °Painful or persistently difficult swallowing  °New shortness of breath  °Black, tarry-looking or red, bloody stools ° °FOLLOW UP:  °If any biopsies were taken you will be contacted by phone or by letter within the next 1-3 weeks. Call [phone]  if you have not heard about the biopsies in 3 weeks.  °Please also call with any specific questions about appointments or follow up tests. ° °

## 2018-10-14 NOTE — Anesthesia Procedure Notes (Signed)
Date/Time: 10/14/2018 11:17 AM Performed by: Sharlette Dense, CRNA Oxygen Delivery Method: Simple face mask

## 2018-10-14 NOTE — Anesthesia Preprocedure Evaluation (Addendum)
Anesthesia Evaluation  Patient identified by MRN, date of birth, ID band Patient awake    Reviewed: Allergy & Precautions, NPO status , Patient's Chart, lab work & pertinent test results  History of Anesthesia Complications (+) PONV and history of anesthetic complications  Airway Mallampati: II  TM Distance: >3 FB Neck ROM: Full    Dental no notable dental hx. (+) Teeth Intact, Dental Advisory Given   Pulmonary    Pulmonary exam normal breath sounds clear to auscultation       Cardiovascular hypertension, Pt. on medications and Pt. on home beta blockers + CAD, + Past MI and +CHF  Normal cardiovascular exam+ Cardiac Defibrillator  Rhythm:Regular Rate:Normal  Cardiomyopathy  Echo 2015- EF 30-35%   Neuro/Psych negative neurological ROS  negative psych ROS   GI/Hepatic Neg liver ROS, GERD  ,  Endo/Other  diabetes, Type 2  Renal/GU Renal InsufficiencyRenal diseaseCr 1.9     Musculoskeletal  (+) Arthritis ,   Abdominal   Peds  Hematology   Anesthesia Other Findings All: PCN, Klonipin  Reproductive/Obstetrics                           Anesthesia Physical Anesthesia Plan  ASA: IV  Anesthesia Plan: MAC   Post-op Pain Management:    Induction: Intravenous  PONV Risk Score and Plan: Treatment may vary due to age or medical condition  Airway Management Planned: Nasal Cannula and Natural Airway  Additional Equipment: None  Intra-op Plan:   Post-operative Plan:   Informed Consent: I have reviewed the patients History and Physical, chart, labs and discussed the procedure including the risks, benefits and alternatives for the proposed anesthesia with the patient or authorized representative who has indicated his/her understanding and acceptance.     Dental advisory given  Plan Discussed with: CRNA  Anesthesia Plan Comments: (Colonoscopy for Hx of Colon polyps)      Anesthesia  Quick Evaluation

## 2018-10-14 NOTE — Transfer of Care (Signed)
Immediate Anesthesia Transfer of Care Note  Patient: Henry [name]  Procedure(s) Performed: COLONOSCOPY WITH PROPOFOL (N/A ) POLYPECTOMY  Patient Location: Endoscopy Unit  Anesthesia Type:MAC  Level of Consciousness: drowsy  Airway & Oxygen Therapy: Patient Spontanous Breathing and Patient connected to face mask oxygen  Post-op Assessment: Report given to RN and Post -op Vital signs reviewed and stable  Post vital signs: Reviewed and stable  Last Vitals:  Vitals Value Taken Time  BP    Temp    Pulse    Resp    SpO2      Last Pain:  Vitals:   10/14/18 1034  PainSc: 0-No pain         Complications: No apparent anesthesia complications

## 2018-10-14 NOTE — H&P (Signed)
HPI: 68 year old Henry [name] with a history of adenomatous and sessile serrated colon polyps, CHF with ischemic cardiomyopathy with ICD in place, EF 30 to 35%, diabetes, Parkinson's, sleep apnea and hypertension presenting for surveillance colonoscopy.  Seen by Nicoletta Ba in the clinic on 09/11/2018.  No new complaints.  Using MiraLAX which is helped with constipation.  No rectal bleeding.  No abdominal pain.  Last colonoscopy was April 2016 where multiple polyps were removed mixture of adenomatous, sessile serrated polyp and 1 of them was hyperplastic.  Past Medical History:  Diagnosis Date  . AICD (automatic cardioverter/defibrillator) present    Dr Lovena Le office visit yearly, MDT   . Arthritis    cane  . Asthma   . Benign neoplasm of colon   . CAD (coronary artery disease)   . Cardiomyopathy   . Cataract   . Complication of anesthesia    pt states that he got a rash  . Constipation   . 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 DM (diabetes mellitus) ([place])    TYPE 2   . GERD (gastroesophageal reflux disease)   . Glaucoma    right eye  . HLD (hyperlipidemia)   . HTN (hypertension)    pt denies 08/19/12  . Hyperplasia, prostate   . Hyperthyroidism    thyroid lobectomy  . MI (myocardial infarction) [facility])    Dr Stanford Breed 2000, x3vessels bypass  . Nephrolithiasis   . OSA (obstructive sleep apnea)    AHI-28,on CPAP, noncompliant with CPAP  . Parkinson disease ([place])    1999  . PONV (postoperative nausea and vomiting)   . Restless legs   . Shortness of breath    Hx: of at all times  . Sleep apnea    does not use CPAP  . Sleep apnea, organic   . UTI (lower urinary tract infection) 09/15/12   Klebsiella  . Ventral hernia     Past Surgical History:  Procedure Laterality Date  . 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 CARDIAC CATHETERIZATION    . CATARACT EXTRACTION W/ INTRAOCULAR LENS IMPLANT     Hx: of right eye  . CATARACT EXTRACTION W/  INTRAOCULAR LENS IMPLANT Left 2018  . COLONOSCOPY N/A 10/13/2014   Procedure: COLONOSCOPY;  Surgeon: Jerene Bears, MD;  Location: [facility] ENDOSCOPY;  Service: Gastroenterology;  Laterality: N/A;  . COLONOSCOPY W/ BIOPSIES AND POLYPECTOMY     Hx: of  . CORONARY ARTERY BYPASS GRAFT  2000   [name], MD  . [place]  . DEEP BRAIN STIMULATOR PLACEMENT  2004   Right and left VIN stimulator placement (parkinsons)  . EYE SURGERY    . FINGER AMPUTATION     left pointer  . IMPLANTABLE CARDIOVERTER DEFIBRILLATOR IMPLANT N/A 11/13/2013   Procedure: IMPLANTABLE CARDIOVERTER DEFIBRILLATOR IMPLANT;  Surgeon: Evans Lance, MD;  Location: [facility] CATH LAB;  Service: Cardiovascular;  Laterality: N/A;  . INSERT / REPLACE / REMOVE PACEMAKER    . LEFT AND RIGHT HEART CATHETERIZATION WITH CORONARY ANGIOGRAM N/A 09/24/2013   Procedure: LEFT AND RIGHT HEART CATHETERIZATION WITH CORONARY ANGIOGRAM;  Surgeon: Burnell Blanks, MD;  Location: [facility] CATH LAB;  Service: Cardiovascular;  Laterality: N/A;  . LITHOTRIPSY     3 different times  . MEDIAN STERNOTOMY  2000  . PULSE GENERATOR IMPLANT Right 11/13/2017   Procedure: Right chest implantable pulse generator change;  Surgeon: Erline Levine, MD;  Location: [place];  Service: Neurosurgery;  Laterality: Right;  Right chest implantable pulse generator change  . SUBTHALAMIC STIMULATOR BATTERY REPLACEMENT N/A 09/05/2012   Procedure: Deep brain stimulator battery change;  Surgeon: Erline Levine, MD;  Location: [place] NEURO ORS;  Service: Neurosurgery;  Laterality: N/A;  Deep brain stimulator battery change  . SUBTHALAMIC STIMULATOR BATTERY REPLACEMENT N/A 06/10/2015   Procedure: Deep Brain stimulator battery change;  Surgeon: Erline Levine, MD;  Location: [place] [facility]O [facility];  Service: Neurosurgery;  Laterality: N/A;  . TONSILLECTOMY      (Not in an outpatient encounter)   Allergies  Allergen Reactions  . Penicillins Anaphylaxis and Rash    Because of a history  of documented adverse serious drug reaction;Medi Alert bracelet  is recommended PATIENT HAS HAD A PCN REACTION WITH IMMEDIATE RASH, FACIAL/TONGUE/THROAT SWELLING, SOB, OR LIGHTHEADEDNESS WITH HYPOTENSION:  #  #  YES  #  #  Has patient had a PCN reaction causing severe rash involving mucus membranes or skin necrosis: unknown Has patient had a PCN reaction that required hospitalization NO Has patient had a PCN reaction occurring within the last 10 years: NO  . Klonopin [Clonazepam] Other (See Comments)    agitation  . Peanut-Containing Drug Products Cough  . Watermelon [Citrullus Vulgaris] Other (See Comments) and Cough    Tickle in throat    Family History  Problem Relation Age of Onset  . Aneurysm Mother   . Alcoholism Father   . HIV/AIDS Brother 84       AIDS  . Healthy Sister   . Healthy Child   . Peripheral vascular disease Other   . Arthritis Other   . Healthy Sister   . Healthy Child   . Healthy Child   . Diabetes Neg Hx   . Heart disease Neg Hx   . Colon cancer Neg Hx   . Prostate cancer Neg Hx     Social History   Tobacco Use  . Smoking status: Never Smoker  . Smokeless tobacco: Never Used  Substance Use Topics  . Alcohol use: Not Currently  . Drug use: No    ROS: As per history of present illness, otherwise negative  BP 109/60   Ht 5\' 9"  (1.753 m)   Wt 113.9 kg   BMI 37.07 kg/m  Gen: awake, alert, NAD HEENT: anicteric, op clear CV: RRR, no mrg Pulm: CTA b/l Abd: soft, NT/ND, +BS throughout Ext: no c/c/e Neuro: nonfocal   RELEVANT LABS AND IMAGING: CBC    Component Value Date/Time   WBC 7.5 11/08/2017 1324   RBC 4.58 11/08/2017 1324   HGB 12.5 (L) 11/08/2017 1324   HCT 40.3 11/08/2017 1324   PLT 198 11/08/2017 1324   PLT 138 06/25/2017   MCV 88.0 11/08/2017 1324   MCH 27.3 11/08/2017 1324   MCHC 31.0 11/08/2017 1324   RDW 15.4 11/08/2017 1324   LYMPHSABS 2.1 04/04/2017 0902   MONOABS 0.6 04/04/2017 0902   EOSABS 0.3 04/04/2017 0902    BASOSABS 0.1 04/04/2017 0902    CMP     Component Value Date/Time   NA 139 08/16/2018 0916   NA 142 12/22/2016 1233   K 3.7 08/16/2018 0916   CL 93 (L) 08/16/2018 0916   CO2 37 (H) 08/16/2018 0916   GLUCOSE 249 (H) 09/18/2018 1345   BUN 31 (H) 08/16/2018 0916   BUN 18 07/01/2018 0000   CREATININE 1.94 (H) 08/16/2018 0916   CREATININE 2.13 06/25/2017   CREATININE 1.53 (H) 05/22/2016 1106   CALCIUM 9.6  08/16/2018 0916   PROT 6.7 08/16/2018 0916   ALBUMIN 4.4 08/16/2018 0916   AST 8 08/16/2018 0916   ALT 8 08/16/2018 0916   ALKPHOS 104 08/16/2018 0916   BILITOT 0.5 08/16/2018 0916   GFRNONAA 42 (L) 07/01/2018 0000   GFRAA 48 (L) 07/01/2018 0000    ASSESSMENT/PLAN: 68 year old Henry [name] with a history of adenomatous and sessile serrated colon polyps, CHF with ischemic cardiomyopathy with ICD in place, EF 30 to 35%, diabetes, Parkinson's, sleep apnea and hypertension presenting for surveillance colonoscopy.    1.  Personal history of colon polyps --slightly overdue for surveillance colonoscopy.  Colonoscopy today with monitored anesthesia care.  The nature of the procedure, as well as the risks, benefits, and alternatives were carefully and thoroughly reviewed with the patient. Ample time for discussion and questions allowed. The patient understood, was satisfied, and agreed to proceed.

## 2018-10-14 NOTE — Anesthesia Postprocedure Evaluation (Addendum)
Anesthesia Post Note  Patient: Henry [name]  Procedure(s) Performed: COLONOSCOPY WITH PROPOFOL (N/A ) POLYPECTOMY     Patient location during evaluation: Endoscopy Anesthesia Type: MAC Level of consciousness: awake and alert Pain management: pain level controlled Vital Signs Assessment: post-procedure vital signs reviewed and stable Respiratory status: spontaneous breathing, nonlabored ventilation and respiratory function stable Cardiovascular status: blood pressure returned to baseline and stable Postop Assessment: no apparent nausea or vomiting Anesthetic complications: no    Last Vitals:  Vitals:   10/14/18 1215 10/14/18 1220  BP: 112/70 126/76  Pulse: 61 (!) 59  Resp: 17 18  Temp:    SpO2: 100% 100%    Last Pain:  Vitals:   10/14/18 1220  TempSrc:   PainSc: 0-No pain                 Lidia Collum

## 2018-10-15 ENCOUNTER — Telehealth: Payer: Self-pay

## 2018-10-15 ENCOUNTER — Encounter (HOSPITAL_COMMUNITY): Payer: Self-pay | Admitting: Internal Medicine

## 2018-10-15 DIAGNOSIS — I509 Heart failure, unspecified: Secondary | ICD-10-CM | POA: Diagnosis not present

## 2018-10-15 DIAGNOSIS — I2510 Atherosclerotic heart disease of native coronary artery without angina pectoris: Secondary | ICD-10-CM | POA: Diagnosis not present

## 2018-10-15 DIAGNOSIS — I110 Hypertensive heart disease with heart failure: Secondary | ICD-10-CM | POA: Diagnosis not present

## 2018-10-15 DIAGNOSIS — R296 Repeated falls: Secondary | ICD-10-CM | POA: Diagnosis not present

## 2018-10-15 DIAGNOSIS — G20 Parkinson's disease: Secondary | ICD-10-CM | POA: Diagnosis not present

## 2018-10-15 DIAGNOSIS — E119 Type 2 diabetes mellitus without complications: Secondary | ICD-10-CM | POA: Diagnosis not present

## 2018-10-15 LAB — SURGICAL PATHOLOGY

## 2018-10-15 NOTE — Telephone Encounter (Signed)
Verbal to decrease visit  to 3 times this week Visit from 10/13/18 moved to end of episodes  Verbals given

## 2018-10-16 DIAGNOSIS — G20 Parkinson's disease: Secondary | ICD-10-CM | POA: Diagnosis not present

## 2018-10-16 DIAGNOSIS — I110 Hypertensive heart disease with heart failure: Secondary | ICD-10-CM | POA: Diagnosis not present

## 2018-10-16 DIAGNOSIS — I509 Heart failure, unspecified: Secondary | ICD-10-CM | POA: Diagnosis not present

## 2018-10-16 DIAGNOSIS — E119 Type 2 diabetes mellitus without complications: Secondary | ICD-10-CM | POA: Diagnosis not present

## 2018-10-16 DIAGNOSIS — I2510 Atherosclerotic heart disease of native coronary artery without angina pectoris: Secondary | ICD-10-CM | POA: Diagnosis not present

## 2018-10-16 DIAGNOSIS — R296 Repeated falls: Secondary | ICD-10-CM | POA: Diagnosis not present

## 2018-10-17 ENCOUNTER — Ambulatory Visit (INDEPENDENT_AMBULATORY_CARE_PROVIDER_SITE_OTHER): Payer: Medicare Other | Admitting: Family Medicine

## 2018-10-17 ENCOUNTER — Other Ambulatory Visit: Payer: Self-pay

## 2018-10-17 ENCOUNTER — Encounter: Payer: Self-pay | Admitting: Family Medicine

## 2018-10-17 VITALS — BP 120/62 | HR 76 | Temp 98.1°F | Ht 67.0 in | Wt 263.5 lb

## 2018-10-17 DIAGNOSIS — I2510 Atherosclerotic heart disease of native coronary artery without angina pectoris: Secondary | ICD-10-CM | POA: Diagnosis not present

## 2018-10-17 DIAGNOSIS — I110 Hypertensive heart disease with heart failure: Secondary | ICD-10-CM | POA: Diagnosis not present

## 2018-10-17 DIAGNOSIS — E119 Type 2 diabetes mellitus without complications: Secondary | ICD-10-CM | POA: Diagnosis not present

## 2018-10-17 DIAGNOSIS — H9192 Unspecified hearing loss, left ear: Secondary | ICD-10-CM | POA: Diagnosis not present

## 2018-10-17 DIAGNOSIS — G20 Parkinson's disease: Secondary | ICD-10-CM | POA: Diagnosis not present

## 2018-10-17 DIAGNOSIS — I509 Heart failure, unspecified: Secondary | ICD-10-CM | POA: Diagnosis not present

## 2018-10-17 DIAGNOSIS — I255 Ischemic cardiomyopathy: Secondary | ICD-10-CM | POA: Diagnosis not present

## 2018-10-17 DIAGNOSIS — R296 Repeated falls: Secondary | ICD-10-CM | POA: Diagnosis not present

## 2018-10-17 NOTE — Progress Notes (Signed)
Henry Quesinberry T. Izack Hoogland, MD Primary Care and [place] at [facility] [place], [address] Phone: [phone]  FAX: [name] - 68 y.o. male  MRN [id number]  Date of Birth: 05-30-1950  Visit Date: 10/17/2018  PCP: Tonia Ghent, MD  Referred by: Tonia Ghent, MD  Chief Complaint  Patient presents with  . Check Ear for Hearing Aide Piece   Subjective:   Henry [name] is a 68 y.o. very pleasant male patient who presents with the following:  Hearing aid piece? Lost in ear. 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  He is not entirely sure, but he thinks this might of happened recently.  Past Medical History, Surgical History, Social History, Family History, Problem List, Medications, and Allergies have been reviewed and updated if relevant.  Patient Active Problem List   Diagnosis Date Noted  . Colon cancer screening 08/25/2018  . CAD (coronary artery disease) 08/25/2018  . Diabetes mellitus type 2, uncontrolled ([place]) 08/15/2018  . Shoulder pain 10/07/2017  . Thrombocytopenia ([place]) 07/08/2017  . Gout, unspecified 06/06/2017  . Dysuria 05/25/2017  . Microscopic hematuria 05/25/2017  . CKD (chronic kidney disease) stage 3, GFR 30-59 ml/min 04/12/2017  . Neuropathy 02/25/2017  . Urinary frequency 09/19/2016  . Constipation 11/27/2015  . Medicare annual wellness visit, initial 09/23/2015  . Advance care planning 09/23/2015  . Spinal stenosis of lumbar region 01/26/2015  . History of colonic polyps   . Restless leg syndrome 08/11/2014  . ICD- MDT, implanted 11/13/13 11/14/2013  . Chronic systolic CHF (congestive heart failure) ([place]) 11/13/2013  . Acute on chronic combined systolic and diastolic congestive heart failure, NYHA class 4 ([place]) 05/12/2013  . 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 Elevated  PSA 12/04/2012  . Other malaise and fatigue 12/04/2012  . PD (Parkinson's disease) ([place]) 08/19/2012  . OSA on CPAP 04/17/2012  . Dysautonomia ([place]) 04/17/2012  . Obesity, morbid ([place]) 04/17/2012  . Hypothyroidism following radioiodine therapy 10/16/2011  . DYSPNEA/SHORTNESS OF BREATH 11/09/2009  . GERD 05/11/2009  . IDDM (insulin dependent diabetes mellitus) ([place]) 04/26/2009  . OTHER SPEC FORMS CHRONIC ISCHEMIC HEART DISEASE 04/24/2008  . HYPERPLASIA PROSTATE UNS W/O UR OBST & OTH LUTS 09/02/2007  . NEPHROLITHIASIS, HX OF 09/02/2007  . HERNIA, VENTRAL 12/28/2006  . Hyperlipidemia 08/31/2006  . HTN (hypertension) 08/31/2006  . Hx of CABG x 3 in 2000 08/31/2006    Past Medical History:  Diagnosis Date  . AICD (automatic cardioverter/defibrillator) present    Dr Lovena Le office visit yearly, MDT   . Arthritis    cane  . Asthma   . Benign neoplasm of colon   . CAD (coronary artery disease)   . Cardiomyopathy   . Cataract   . Complication of anesthesia    pt states that he got a rash  . Constipation   . 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 DM (diabetes mellitus) ([place])    TYPE 2   . GERD (gastroesophageal reflux disease)   . Glaucoma    right eye  . HLD (hyperlipidemia)   . HTN (hypertension)    pt denies 08/19/12  . Hyperplasia, prostate   . Hyperthyroidism    thyroid lobectomy  . MI (myocardial infarction) ([place])    Dr Stanford Breed  2000, x3vessels bypass  . Nephrolithiasis   . OSA (obstructive sleep apnea)    AHI-28,on CPAP, noncompliant with CPAP  . Parkinson disease ([place])    1999  . PONV (postoperative nausea and vomiting)   . Restless legs   . Shortness of breath    Hx: of at all times  . Sleep apnea    does not use CPAP  . Sleep apnea, organic   . UTI (lower urinary tract infection) 09/15/12   Klebsiella  . Ventral hernia     Past Surgical History:  Procedure Laterality Date  . 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  BIOPSY  10/14/2018   Procedure: BIOPSY;  Surgeon: Jerene Bears, MD;  Location: [facility];  Service: Gastroenterology;;  . CARDIAC CATHETERIZATION    . CATARACT EXTRACTION W/ INTRAOCULAR LENS IMPLANT     Hx: of right eye  . CATARACT EXTRACTION W/ INTRAOCULAR LENS IMPLANT Left 2018  . COLONOSCOPY N/A 10/13/2014   Procedure: COLONOSCOPY;  Surgeon: Jerene Bears, MD;  Location: [facility];  Service: Gastroenterology;  Laterality: N/A;  . COLONOSCOPY W/ BIOPSIES AND POLYPECTOMY     Hx: of  . COLONOSCOPY WITH PROPOFOL N/A 10/14/2018   Procedure: COLONOSCOPY WITH PROPOFOL;  Surgeon: Jerene Bears, MD;  Location: [facility];  Service: Gastroenterology;  Laterality: N/A;  . CORONARY ARTERY BYPASS GRAFT  2000   [name], MD  . [place]  . DEEP BRAIN STIMULATOR PLACEMENT  2004   Right and left VIN stimulator placement (parkinsons)  . EYE SURGERY    . FINGER AMPUTATION     left pointer  . IMPLANTABLE CARDIOVERTER DEFIBRILLATOR IMPLANT N/A 11/13/2013   Procedure: IMPLANTABLE CARDIOVERTER DEFIBRILLATOR IMPLANT;  Surgeon: Evans Lance, MD;  Location: [facility] C[facility] LAB;  Service: Cardiovascular;  Laterality: N/A;  . INSERT / REPLACE / REMOVE PACEMAKER    . LEFT AND RIGHT HEART CATHETERIZATION WITH CORONARY ANGIOGRAM N/A 09/24/2013   Procedure: LEFT AND RIGHT HEART CATHETERIZATION WITH CORONARY ANGIOGRAM;  Surgeon: Burnell Blanks, MD;  Location: [facility] CATH LAB;  Service: Cardiovascular;  Laterality: N/A;  . LITHOTRIPSY     3 different times  . MEDIAN STERNOTOMY  2000  . POLYPECTOMY  10/14/2018   Procedure: POLYPECTOMY;  Surgeon: Jerene Bears, MD;  Location: [facility];  Service: Gastroenterology;;  . PULSE GENERATOR IMPLANT Right 11/13/2017   Procedure: Right chest implantable pulse generator change;  Surgeon: Erline Levine, MD;  Location: [facility];  Service: Neurosurgery;  Laterality: Right;  Right chest implantable pulse generator change  . SUBTHALAMIC STIMULATOR BATTERY  REPLACEMENT N/A 09/05/2012   Procedure: Deep brain stimulator battery change;  Surgeon: Erline Levine, MD;  Location: [facility];  Service: Neurosurgery;  Laterality: N/A;  Deep brain stimulator battery change  . SUBTHALAMIC STIMULATOR BATTERY REPLACEMENT N/A 06/10/2015   Procedure: Deep Brain stimulator battery change;  Surgeon: Erline Levine, MD;  Location: [facility];  Service: Neurosurgery;  Laterality: N/A;  . TONSILLECTOMY      Social History   Socioeconomic History  . Marital status: Married    Spouse name: [name]  . Number of children: 2  . Years of education: 76  . Highest education level: High school graduate  Occupational History  . Occupation: DISABLED    Comment: [employer]  Social Needs  . Financial resource strain: Not hard at all  . Food insecurity    Worry: Never true    Inability:  Never true  . Transportation needs    Medical: No    Non-medical: No  Tobacco Use  . Smoking status: Never Smoker  . Smokeless tobacco: Never Used  Substance and Sexual Activity  . Alcohol use: Not Currently  . Drug use: No  . Sexual activity: Not Currently  Lifestyle  . Physical activity    Days per week: 3 days    Minutes per session: 10 min  . Stress: Not at all  Relationships  . Social Herbalist on phone: Not on file    Gets together: Not on file    Attends religious service: Not on file    Active member of club or organization: Not on file    Attends meetings of clubs or organizations: Not on file    Relationship status: Not on file  . Intimate partner violence    Fear of current or ex partner: No    Emotionally abused: No    Physically abused: No    Forced sexual activity: No  Other Topics Concern  . Not on file  Social History Narrative   From [facility]   Retired/disability Clinical research associate   Likes to fish.     Married 1972   3 kids   [place] fan    Family History  Problem Relation Age of Onset  . Aneurysm Mother   .  Alcoholism Father   . HIV/AIDS Brother 52       AIDS  . Healthy Sister   . Healthy Child   . Peripheral vascular disease Other   . Arthritis Other   . Healthy Sister   . Healthy Child   . Healthy Child   . Diabetes Neg Hx   . Heart disease Neg Hx   . Colon cancer Neg Hx   . Prostate cancer Neg Hx     Allergies  Allergen Reactions  . Penicillins Anaphylaxis and Rash    Because of a history of documented adverse serious drug reaction;Medi Alert bracelet  is recommended PATIENT HAS HAD A PCN REACTION WITH IMMEDIATE RASH, FACIAL/TONGUE/THROAT SWELLING, SOB, OR LIGHTHEADEDNESS WITH HYPOTENSION:  #  #  YES  #  #  Has patient had a PCN reaction causing severe rash involving mucus membranes or skin necrosis: unknown Has patient had a PCN reaction that required hospitalization NO Has patient had a PCN reaction occurring within the last 10 years: NO  . Klonopin [Clonazepam] Other (See Comments)    agitation  . Peanut-Containing Drug Products Cough  . Watermelon [Citrullus Vulgaris] Other (See Comments) and Cough    Tickle in throat    Medication list reviewed and updated in full in [place].   GEN: No acute illnesses, no fevers, chills. GI: No n/v/d, eating normally Pulm: No SOB Interactive and getting along well at home.  Otherwise, ROS is as per the HPI.  Objective:   BP 120/62   Pulse 76   Temp 98.1 F (36.7 C) (Temporal)   Ht '5\' 7"'$  (1.702 m)   Wt 263 lb 8 oz (119.5 kg)   SpO2 98%   BMI 41.27 kg/m   GEN: WDWN, NAD, Non-toxic, A & O x 3 HEENT: Atraumatic, Normocephalic. Neck supple. No masses, No LAD. Ears and Nose: No external deformity.  Extensive evaluation of the left ear and canal, and there is no foreign body. EXTR: No c/c/e NEURO Normal gait.  PSYCH: Normally interactive. Conversant. Not depressed or anxious appearing.  Calm demeanor.  Laboratory and Imaging Data:  Assessment and Plan:     ICD-10-CM   1. Hearing problem of left ear  H91.92     Reassurance, there is nothing in his ear.  Follow-up: No follow-ups on file.  No orders of the defined types were placed in this encounter.  No orders of the defined types were placed in this encounter.   Signed,  Maud Deed. Yu Cragun, MD   Outpatient Encounter Medications as of 10/17/2018  Medication Sig  . acetaminophen (TYLENOL) 500 MG tablet Take 500 mg by mouth 2 (two) times daily as needed for mild pain or headache.   . albuterol (PROVENTIL HFA;VENTOLIN HFA) 108 (90 Base) MCG/ACT inhaler Inhale 2 puffs into the lungs every 6 (six) hours as needed for wheezing or shortness of breath.  . allopurinol (ZYLOPRIM) 100 MG tablet Take 1.5 tablets (150 mg total) by mouth daily.  . AMBULATORY NON FORMULARY MEDICATION Ustep walker DX: G20  . aspirin EC 81 MG tablet Take 81 mg by mouth daily.  . Carbidopa-Levodopa ER (SINEMET CR) 25-100 MG tablet controlled release 2 tablets at 7am/2 tablets at 11am/2 at 3pm/1 at 7pm/1 at bedtime (Patient taking differently: Take 1-2 tablets by mouth See admin instructions. 2 tablets at 7am, 2 tablets at 11am, 2 at 3pm, 1 at 7pm, 1 at bedtime.)  . Cholecalciferol (VITAMIN D) 50 MCG (2000 UT) tablet Take 2,000 Units by mouth daily.   . colchicine 0.6 MG tablet TAKE 1 TABLET BY MOUTH TWICE A DAY AS NEEDED FOR GOUT (Patient taking differently: Take 0.6 mg by mouth 2 (two) times daily as needed (gout). )  . ferrous sulfate 325 (65 FE) MG EC tablet Take 325 mg by mouth daily.   . furosemide (LASIX) 80 MG tablet TAKE 1 & 1/2 TABLETS BY MOUTH DAILY (Patient taking differently: Take 80 mg by mouth 2 (two) times daily. )  . gabapentin (NEURONTIN) 100 MG capsule TAKE 2 CAPSULES BY MOUTH EVERY MORNING AND TAKE 4 CAPSULES AT BEDTIME (Patient taking differently: Take 200-400 mg by mouth See admin instructions. TAKE 2 CAPSULES BY MOUTH EVERY MORNING AND TAKE 4 CAPSULES AT BEDTIME)  . glucose blood (ONETOUCH VERIO) test strip USE AS DIRECTED TO CHECK BLOOD SUGAR TWICE A DAY.  DX:E11.65  . glucose blood test strip 1 each by Other route as needed for other. Use as instructed to check blood sugar 4 times daily.  Marland Kitchen HUMALOG 100 UNIT/ML injection USE MAXIMUM 76 UNITS PER   DAY WITH V-GO PUMP (Patient taking differently: Inject into the skin See admin instructions. Basal rate 1.5 units/hr (wife is unsure). Boluses doses per sliding scale.)  . Insulin Disposable Pump (OMNIPOD DASH SYSTEM) KIT 1 each by Does not apply route. Use Dash system for Continuous Blood Glucose Monitoring.  Marland Kitchen KLOR-CON M20 20 MEQ tablet TAKE 2 TABLETS (40 MEQ TOTAL) BY MOUTH DAILY. (Patient taking differently: Take 20 mEq by mouth 2 (two) times daily. )  . levothyroxine (SYNTHROID) 125 MCG tablet TAKE 1 TABLET BY MOUTH EVERY DAY BEFORE BREAKFAST (Patient taking differently: 125 mcg daily before breakfast. )  . liraglutide (VICTOZA) 18 MG/3ML SOPN Inject 0.3 mLs (1.8 mg total) into the skin daily.  Marland Kitchen lisinopril (PRINIVIL,ZESTRIL) 2.5 MG tablet Take 1 tablet (2.5 mg total) by mouth daily.  . metolazone (ZAROXOLYN) 2.5 MG tablet TAKE 1 TABLET BY MOUTH ONCE A WEEK. (Patient taking differently: Take 2.5 mg by mouth every Friday. )  . metoprolol succinate (TOPROL-XL) 50 MG 24 hr tablet TAKE 1  TABLET BY MOUTH DAILY. TAKE WITH OR IMMEDIATELY FOLLOWING A MEAL. (Patient taking differently: Take 50 mg by mouth every evening. )  . omega-3 acid ethyl esters (LOVAZA) 1 g capsule Take 2 capsules (2 g total) by mouth 2 (two) times daily. Take 2 capsules (2grams total) by mouth twice daily.  Marland Kitchen OVER THE COUNTER MEDICATION Apply 1 application topically at bedtime. Theraworx Pain Cream  . polyethylene glycol (MIRALAX / GLYCOLAX) packet Take 17 g by mouth daily.   . rosuvastatin (CRESTOR) 20 MG tablet TAKE 1 TABLET BY MOUTH EVERY DAY (Patient taking differently: Take 20 mg by mouth every evening. )  . traMADol (ULTRAM) 50 MG tablet TAKE 1 TABLET BY MOUTH 2 TIMES DAILY AS NEEDED FOR MODERATE PAIN. (Patient taking differently: Take  50 mg by mouth 2 (two) times daily. )  . travoprost, benzalkonium, (TRAVATAN) 0.004 % ophthalmic solution Place 1 drop into the right eye at bedtime.   . vitamin B-12 (CYANOCOBALAMIN) 1000 MCG tablet Take 1,000 mcg by mouth daily.  Grant Ruts INHUB 250-50 MCG/DOSE AEPB INHALE 1 PUFF INTO THE LUNGS 2 TIMES DAILY. (Patient taking differently: Inhale 1 puff into the lungs 2 (two) times daily as needed (shortness of breath). )   No facility-administered encounter medications on file as of 10/17/2018.

## 2018-10-18 ENCOUNTER — Encounter: Payer: Self-pay | Admitting: Internal Medicine

## 2018-10-18 DIAGNOSIS — E119 Type 2 diabetes mellitus without complications: Secondary | ICD-10-CM | POA: Diagnosis not present

## 2018-10-18 DIAGNOSIS — I2510 Atherosclerotic heart disease of native coronary artery without angina pectoris: Secondary | ICD-10-CM | POA: Diagnosis not present

## 2018-10-18 DIAGNOSIS — R296 Repeated falls: Secondary | ICD-10-CM | POA: Diagnosis not present

## 2018-10-18 DIAGNOSIS — I110 Hypertensive heart disease with heart failure: Secondary | ICD-10-CM | POA: Diagnosis not present

## 2018-10-18 DIAGNOSIS — G20 Parkinson's disease: Secondary | ICD-10-CM | POA: Diagnosis not present

## 2018-10-18 DIAGNOSIS — I509 Heart failure, unspecified: Secondary | ICD-10-CM | POA: Diagnosis not present

## 2018-10-21 DIAGNOSIS — G20 Parkinson's disease: Secondary | ICD-10-CM | POA: Diagnosis not present

## 2018-10-21 DIAGNOSIS — I509 Heart failure, unspecified: Secondary | ICD-10-CM | POA: Diagnosis not present

## 2018-10-21 DIAGNOSIS — I2510 Atherosclerotic heart disease of native coronary artery without angina pectoris: Secondary | ICD-10-CM | POA: Diagnosis not present

## 2018-10-21 DIAGNOSIS — R296 Repeated falls: Secondary | ICD-10-CM | POA: Diagnosis not present

## 2018-10-21 DIAGNOSIS — I110 Hypertensive heart disease with heart failure: Secondary | ICD-10-CM | POA: Diagnosis not present

## 2018-10-21 DIAGNOSIS — E119 Type 2 diabetes mellitus without complications: Secondary | ICD-10-CM | POA: Diagnosis not present

## 2018-10-22 DIAGNOSIS — I509 Heart failure, unspecified: Secondary | ICD-10-CM | POA: Diagnosis not present

## 2018-10-22 DIAGNOSIS — I110 Hypertensive heart disease with heart failure: Secondary | ICD-10-CM | POA: Diagnosis not present

## 2018-10-22 DIAGNOSIS — G20 Parkinson's disease: Secondary | ICD-10-CM | POA: Diagnosis not present

## 2018-10-22 DIAGNOSIS — R296 Repeated falls: Secondary | ICD-10-CM | POA: Diagnosis not present

## 2018-10-22 DIAGNOSIS — E119 Type 2 diabetes mellitus without complications: Secondary | ICD-10-CM | POA: Diagnosis not present

## 2018-10-22 DIAGNOSIS — I2510 Atherosclerotic heart disease of native coronary artery without angina pectoris: Secondary | ICD-10-CM | POA: Diagnosis not present

## 2018-10-23 ENCOUNTER — Ambulatory Visit (INDEPENDENT_AMBULATORY_CARE_PROVIDER_SITE_OTHER): Payer: Medicare Other | Admitting: *Deleted

## 2018-10-23 DIAGNOSIS — I110 Hypertensive heart disease with heart failure: Secondary | ICD-10-CM | POA: Diagnosis not present

## 2018-10-23 DIAGNOSIS — I5022 Chronic systolic (congestive) heart failure: Secondary | ICD-10-CM | POA: Diagnosis not present

## 2018-10-23 DIAGNOSIS — I2510 Atherosclerotic heart disease of native coronary artery without angina pectoris: Secondary | ICD-10-CM | POA: Diagnosis not present

## 2018-10-23 DIAGNOSIS — I509 Heart failure, unspecified: Secondary | ICD-10-CM | POA: Diagnosis not present

## 2018-10-23 DIAGNOSIS — G20 Parkinson's disease: Secondary | ICD-10-CM | POA: Diagnosis not present

## 2018-10-23 DIAGNOSIS — E119 Type 2 diabetes mellitus without complications: Secondary | ICD-10-CM | POA: Diagnosis not present

## 2018-10-23 DIAGNOSIS — R296 Repeated falls: Secondary | ICD-10-CM | POA: Diagnosis not present

## 2018-10-23 LAB — CUP PACEART REMOTE DEVICE CHECK
Battery Remaining Longevity: 67 mo
Battery Voltage: 2.98 V
Brady Statistic AP VP Percent: 0.01 %
Brady Statistic AP VS Percent: 1.01 %
Brady Statistic AS VP Percent: 0.06 %
Brady Statistic AS VS Percent: 98.93 %
Brady Statistic RA Percent Paced: 1.01 %
Brady Statistic RV Percent Paced: 0.06 %
Date Time Interrogation Session: 20201014073523
HighPow Impedance: 70 Ohm
Implantable Lead Implant Date: 20151105
Implantable Lead Implant Date: 20151105
Implantable Lead Location: 753859
Implantable Lead Location: 753860
Implantable Lead Model: 5076
Implantable Pulse Generator Implant Date: 20151105
Lead Channel Impedance Value: 437 Ohm
Lead Channel Impedance Value: 437 Ohm
Lead Channel Impedance Value: 494 Ohm
Lead Channel Pacing Threshold Amplitude: 0.375 V
Lead Channel Pacing Threshold Amplitude: 1 V
Lead Channel Pacing Threshold Pulse Width: 0.4 ms
Lead Channel Pacing Threshold Pulse Width: 0.4 ms
Lead Channel Sensing Intrinsic Amplitude: 1.875 mV
Lead Channel Sensing Intrinsic Amplitude: 1.875 mV
Lead Channel Sensing Intrinsic Amplitude: 15.875 mV
Lead Channel Sensing Intrinsic Amplitude: 15.875 mV
Lead Channel Setting Pacing Amplitude: 2.25 V
Lead Channel Setting Pacing Amplitude: 2.5 V
Lead Channel Setting Pacing Pulse Width: 0.4 ms
Lead Channel Setting Sensing Sensitivity: 0.3 mV

## 2018-10-24 DIAGNOSIS — I509 Heart failure, unspecified: Secondary | ICD-10-CM | POA: Diagnosis not present

## 2018-10-24 DIAGNOSIS — G20 Parkinson's disease: Secondary | ICD-10-CM | POA: Diagnosis not present

## 2018-10-24 DIAGNOSIS — Z95810 Presence of automatic (implantable) cardiac defibrillator: Secondary | ICD-10-CM | POA: Diagnosis not present

## 2018-10-24 DIAGNOSIS — K219 Gastro-esophageal reflux disease without esophagitis: Secondary | ICD-10-CM | POA: Diagnosis not present

## 2018-10-24 DIAGNOSIS — J45909 Unspecified asthma, uncomplicated: Secondary | ICD-10-CM | POA: Diagnosis not present

## 2018-10-24 DIAGNOSIS — I110 Hypertensive heart disease with heart failure: Secondary | ICD-10-CM | POA: Diagnosis not present

## 2018-10-24 DIAGNOSIS — E119 Type 2 diabetes mellitus without complications: Secondary | ICD-10-CM | POA: Diagnosis not present

## 2018-10-24 DIAGNOSIS — Z7982 Long term (current) use of aspirin: Secondary | ICD-10-CM | POA: Diagnosis not present

## 2018-10-24 DIAGNOSIS — E039 Hypothyroidism, unspecified: Secondary | ICD-10-CM | POA: Diagnosis not present

## 2018-10-24 DIAGNOSIS — E785 Hyperlipidemia, unspecified: Secondary | ICD-10-CM | POA: Diagnosis not present

## 2018-10-24 DIAGNOSIS — I2510 Atherosclerotic heart disease of native coronary artery without angina pectoris: Secondary | ICD-10-CM | POA: Diagnosis not present

## 2018-10-24 DIAGNOSIS — R296 Repeated falls: Secondary | ICD-10-CM | POA: Diagnosis not present

## 2018-10-24 DIAGNOSIS — M1990 Unspecified osteoarthritis, unspecified site: Secondary | ICD-10-CM | POA: Diagnosis not present

## 2018-10-25 DIAGNOSIS — I2510 Atherosclerotic heart disease of native coronary artery without angina pectoris: Secondary | ICD-10-CM | POA: Diagnosis not present

## 2018-10-25 DIAGNOSIS — I509 Heart failure, unspecified: Secondary | ICD-10-CM | POA: Diagnosis not present

## 2018-10-25 DIAGNOSIS — I110 Hypertensive heart disease with heart failure: Secondary | ICD-10-CM | POA: Diagnosis not present

## 2018-10-25 DIAGNOSIS — G20 Parkinson's disease: Secondary | ICD-10-CM | POA: Diagnosis not present

## 2018-10-25 DIAGNOSIS — E119 Type 2 diabetes mellitus without complications: Secondary | ICD-10-CM | POA: Diagnosis not present

## 2018-10-25 DIAGNOSIS — R296 Repeated falls: Secondary | ICD-10-CM | POA: Diagnosis not present

## 2018-10-29 DIAGNOSIS — R296 Repeated falls: Secondary | ICD-10-CM | POA: Diagnosis not present

## 2018-10-29 DIAGNOSIS — G20 Parkinson's disease: Secondary | ICD-10-CM | POA: Diagnosis not present

## 2018-10-29 DIAGNOSIS — E119 Type 2 diabetes mellitus without complications: Secondary | ICD-10-CM | POA: Diagnosis not present

## 2018-10-29 DIAGNOSIS — I2510 Atherosclerotic heart disease of native coronary artery without angina pectoris: Secondary | ICD-10-CM | POA: Diagnosis not present

## 2018-10-29 DIAGNOSIS — I110 Hypertensive heart disease with heart failure: Secondary | ICD-10-CM | POA: Diagnosis not present

## 2018-10-29 DIAGNOSIS — I509 Heart failure, unspecified: Secondary | ICD-10-CM | POA: Diagnosis not present

## 2018-10-31 DIAGNOSIS — R296 Repeated falls: Secondary | ICD-10-CM | POA: Diagnosis not present

## 2018-10-31 DIAGNOSIS — E119 Type 2 diabetes mellitus without complications: Secondary | ICD-10-CM | POA: Diagnosis not present

## 2018-10-31 DIAGNOSIS — I110 Hypertensive heart disease with heart failure: Secondary | ICD-10-CM | POA: Diagnosis not present

## 2018-10-31 DIAGNOSIS — I509 Heart failure, unspecified: Secondary | ICD-10-CM | POA: Diagnosis not present

## 2018-10-31 DIAGNOSIS — I2510 Atherosclerotic heart disease of native coronary artery without angina pectoris: Secondary | ICD-10-CM | POA: Diagnosis not present

## 2018-10-31 DIAGNOSIS — G20 Parkinson's disease: Secondary | ICD-10-CM | POA: Diagnosis not present

## 2018-11-04 NOTE — Progress Notes (Signed)
Remote ICD transmission.   

## 2018-11-05 DIAGNOSIS — R296 Repeated falls: Secondary | ICD-10-CM | POA: Diagnosis not present

## 2018-11-05 DIAGNOSIS — I509 Heart failure, unspecified: Secondary | ICD-10-CM | POA: Diagnosis not present

## 2018-11-05 DIAGNOSIS — E119 Type 2 diabetes mellitus without complications: Secondary | ICD-10-CM | POA: Diagnosis not present

## 2018-11-05 DIAGNOSIS — I110 Hypertensive heart disease with heart failure: Secondary | ICD-10-CM | POA: Diagnosis not present

## 2018-11-05 DIAGNOSIS — G20 Parkinson's disease: Secondary | ICD-10-CM | POA: Diagnosis not present

## 2018-11-05 DIAGNOSIS — I2510 Atherosclerotic heart disease of native coronary artery without angina pectoris: Secondary | ICD-10-CM | POA: Diagnosis not present

## 2018-11-11 ENCOUNTER — Other Ambulatory Visit: Payer: Self-pay | Admitting: Family Medicine

## 2018-11-11 DIAGNOSIS — I509 Heart failure, unspecified: Secondary | ICD-10-CM

## 2018-11-13 ENCOUNTER — Other Ambulatory Visit: Payer: Self-pay | Admitting: Endocrinology

## 2018-12-03 DIAGNOSIS — H4031X3 Glaucoma secondary to eye trauma, right eye, severe stage: Secondary | ICD-10-CM | POA: Diagnosis not present

## 2018-12-03 DIAGNOSIS — E119 Type 2 diabetes mellitus without complications: Secondary | ICD-10-CM | POA: Diagnosis not present

## 2018-12-03 DIAGNOSIS — Z794 Long term (current) use of insulin: Secondary | ICD-10-CM | POA: Diagnosis not present

## 2018-12-03 DIAGNOSIS — H40032 Anatomical narrow angle, left eye: Secondary | ICD-10-CM | POA: Diagnosis not present

## 2018-12-03 LAB — HM DIABETES EYE EXAM

## 2018-12-05 ENCOUNTER — Other Ambulatory Visit: Payer: Self-pay | Admitting: Family Medicine

## 2018-12-09 NOTE — Telephone Encounter (Signed)
Electronic refill request. Colchicine Last office visit:   10/17/2018 Acute [place] Last Filled:    30 tablet 1 02/17/2018  Please advise.

## 2018-12-10 NOTE — Telephone Encounter (Signed)
Sent. Thanks.   

## 2018-12-11 ENCOUNTER — Ambulatory Visit: Payer: Medicare Other | Admitting: Endocrinology

## 2018-12-18 ENCOUNTER — Other Ambulatory Visit: Payer: Self-pay | Admitting: Family Medicine

## 2018-12-26 ENCOUNTER — Other Ambulatory Visit: Payer: Self-pay | Admitting: Endocrinology

## 2018-12-27 ENCOUNTER — Other Ambulatory Visit: Payer: Self-pay | Admitting: Endocrinology

## 2018-12-30 ENCOUNTER — Other Ambulatory Visit: Payer: Self-pay | Admitting: *Deleted

## 2018-12-30 ENCOUNTER — Encounter: Payer: Self-pay | Admitting: Family Medicine

## 2018-12-30 NOTE — Telephone Encounter (Signed)
MyChart Refill Requests: [place] Last office visit:   10/17/2018 Acute- [facility] Last Filled:   180 each 3 11/26/2017   Albuterol Last office visit:   10/17/2018 Acute - [place] Last Filled:    18 g 5 12/28/2017

## 2018-12-31 MED ORDER — FLUTICASONE-SALMETEROL 250-50 MCG/DOSE IN AEPB: INHALATION_SPRAY | RESPIRATORY_TRACT | 3 refills | Status: DC

## 2018-12-31 MED ORDER — ALBUTEROL SULFATE HFA 108 (90 BASE) MCG/ACT IN AERS: 2.0000 | INHALATION_SPRAY | Freq: Four times a day (QID) | RESPIRATORY_TRACT | 5 refills | Status: DC | PRN

## 2018-12-31 NOTE — Telephone Encounter (Signed)
Sent. Thanks.   

## 2019-01-02 ENCOUNTER — Other Ambulatory Visit: Payer: Medicare Other

## 2019-01-02 ENCOUNTER — Other Ambulatory Visit: Payer: Self-pay

## 2019-01-02 ENCOUNTER — Encounter: Payer: Self-pay | Admitting: Family Medicine

## 2019-01-02 ENCOUNTER — Ambulatory Visit (INDEPENDENT_AMBULATORY_CARE_PROVIDER_SITE_OTHER): Payer: Medicare Other | Admitting: Family Medicine

## 2019-01-02 VITALS — BP 98/60 | HR 78 | Temp 99.7°F

## 2019-01-02 DIAGNOSIS — J4541 Moderate persistent asthma with (acute) exacerbation: Secondary | ICD-10-CM

## 2019-01-02 DIAGNOSIS — Z20822 Contact with and (suspected) exposure to covid-19: Secondary | ICD-10-CM

## 2019-01-02 DIAGNOSIS — Z20828 Contact with and (suspected) exposure to other viral communicable diseases: Secondary | ICD-10-CM | POA: Diagnosis not present

## 2019-01-02 MED ORDER — PREDNISONE 20 MG PO TABS: 40.0000 mg | ORAL_TABLET | Freq: Every day | ORAL | 0 refills | Status: AC

## 2019-01-02 NOTE — Progress Notes (Signed)
I connected with Henry [name] on 01/02/19 at 10:20 AM EST by video and verified that I am speaking with the correct person using two identifiers.   I discussed the limitations, risks, security and privacy concerns of performing an evaluation and management service by video and the availability of in person appointments. I also discussed with the patient that there may be a patient responsible charge related to this service. The patient expressed understanding and agreed to proceed.  Patient location: Home Provider Location: [place] Participants: Lesleigh Noe and Henry [name] and wife   Subjective:     Henry [name] is a 68 y.o. male presenting for Nasal Congestion (and chest congestion. sx started 4 to 5 days ago. Some wheezing. Cough at times.)     URI  This is a new problem. The current episode started in the past 7 days. The problem has been unchanged. There has been no fever. Associated symptoms include chest pain (improved), congestion, coughing, rhinorrhea, a sore throat and wheezing. Pertinent negatives include no diarrhea, ear pain, headaches, nausea, sinus pain or vomiting. Treatments tried: inhalers, vicks vapor rub.   Sick Contact: no Loss of taste/smell: yes to taste, loss of smell  Wheezing - worse with laying down, improved with sitting  No change in weight or swelling  Review of Systems  HENT: Positive for congestion, rhinorrhea and sore throat. Negative for ear pain and sinus pain.   Respiratory: Positive for cough and wheezing.   Cardiovascular: Positive for chest pain (improved).  Gastrointestinal: Negative for diarrhea, nausea and vomiting.  Neurological: Negative for headaches.     Social History   Tobacco Use  Smoking Status Never Smoker  Smokeless Tobacco Never Used        Objective:   BP Readings from Last 3 Encounters:  01/02/19 98/60  10/17/18 120/62  10/14/18 126/76   Wt Readings from Last 3 Encounters:   10/17/18 263 lb 8 oz (119.5 kg)  10/14/18 251 lb (113.9 kg)  09/19/18 260 lb 9.6 oz (118.2 kg)    BP 98/60 Comment: per patient  Pulse 78 Comment: per patient  Temp 99.7 F (37.6 C) Comment: per patient   Physical Exam Constitutional:      Appearance: Normal appearance. He is not ill-appearing.  HENT:     Head: Normocephalic and atraumatic.     Right Ear: External ear normal.     Left Ear: External ear normal.  Eyes:     Conjunctiva/sclera: Conjunctivae normal.  Pulmonary:     Effort: Pulmonary effort is normal. No respiratory distress.     Comments: Occasional cough Neurological:     Mental Status: He is alert. Mental status is at baseline.  Psychiatric:        Mood and Affect: Mood normal.        Behavior: Behavior normal.        Thought Content: Thought content normal.        Judgment: Judgment normal.            Assessment & Plan:   Problem List Items Addressed This Visit    None    Visit Diagnoses    Moderate persistent asthma with exacerbation    -  Primary   Relevant Medications   predniSONE (DELTASONE) 20 MG tablet   Suspected COVID-19 virus infection       Relevant Orders   MyChart COVID-19 home monitoring program   Temperature monitoring     Based on  hx - suspect viral infection with possible worsening asthma exacerbation. Pt with CHF, but no weight change or increased fluid.   Advised 1 day of albuterol q4-6 hours. If no improvement in wheezing trial of steroids given holiday weekend.    Given symptoms possible covid-19 and would recommend being tested ER precautions given Advised staying out of work and isolating until results have come back Information for contacting [facility] for scheduled testing provided - pt scheduled while on phone for 11:10 today   Return if symptoms worsen or fail to improve.  Lesleigh Noe, MD

## 2019-01-02 NOTE — Patient Instructions (Addendum)
#   Get tested for Covid-19  Treatment  1) Take the albuterol every 4-6 hours while awake 2) Take the prednisone for 5 days if still wheezing after 1 day of doing the albuterol  Quarantine until the test results come back  How to care for yourself at home:   1) Drink plenty of fluids 2) Get lots of rest 3) Wash your hands regularly - for 20 seconds 4) Cover your mouth when you cough -- ideally cough into your elbow 5) Take tylenol - can do up to 1000 mg every 8 hours (or do smaller doses more frequently) - Avoid Ibuprofen if able 6) Stay home - avoid contact with other people. Ideally have someone bring your groceries, etc.  7) Avoid touching your eyes, nose, mouth 8) Over the counter cold medication may be helpful  Positive Coronovirus - When isolation ends 1) 10 days have passed from when symptoms started  2) Other symptoms have improved - no longer with cough or shortness of breath 3) At least 24 hours since your last fever without needing any tylenol or ibuprofen  Call the clinic immediately or consider going to the emergency room if:  1) Trouble Breathing 2) Persistent pain or pressure in the chest 3) New confusion or inability to wake 4) Bluish lips or face 5) Notify them that you may have COVID-19

## 2019-01-06 ENCOUNTER — Ambulatory Visit: Payer: Medicare Other | Attending: Internal Medicine

## 2019-01-06 DIAGNOSIS — Z20822 Contact with and (suspected) exposure to covid-19: Secondary | ICD-10-CM

## 2019-01-06 NOTE — Telephone Encounter (Signed)
Spoke with pt's wife she will call the insurance company to get the information on who they will now cover since [facility] is no longer available

## 2019-01-07 ENCOUNTER — Other Ambulatory Visit: Payer: Self-pay | Admitting: Family Medicine

## 2019-01-07 ENCOUNTER — Other Ambulatory Visit: Payer: Self-pay

## 2019-01-07 MED ORDER — OZEMPIC (0.25 OR 0.5 MG/DOSE) 2 MG/1.5ML ~~LOC~~ SOPN: 0.5000 mg | PEN_INJECTOR | SUBCUTANEOUS | 1 refills | Status: DC

## 2019-01-07 NOTE — Telephone Encounter (Signed)
Electronic refill request. Tramadol Last office visit:   01/02/2019 Acute - [facility] Last Filled:    60 tablet 2 09/29/2018  Please advise.

## 2019-01-08 LAB — NOVEL CORONAVIRUS, NAA: SARS-CoV-2, NAA: DETECTED — AB

## 2019-01-08 NOTE — Telephone Encounter (Signed)
Sent. Thanks.   

## 2019-01-09 ENCOUNTER — Telehealth: Payer: Self-pay | Admitting: Nurse Practitioner

## 2019-01-09 NOTE — Telephone Encounter (Signed)
Called to Discuss with patient about Covid symptoms and the use of bamlanivimab, a monoclonal antibody infusion for those with mild to moderate Covid symptoms and at a high risk of hospitalization.     Pt is qualified for this infusion at the [facility] infusion center due to co-morbid conditions and/or a member of an at-risk group.     Patient Active Problem List   Diagnosis Date Noted  . Colon cancer screening 08/25/2018  . CAD (coronary artery disease) 08/25/2018  . Diabetes mellitus type 2, uncontrolled ([place]) 08/15/2018  . Shoulder pain 10/07/2017  . Thrombocytopenia ([place]) 07/08/2017  . Gout, unspecified 06/06/2017  . Dysuria 05/25/2017  . Microscopic hematuria 05/25/2017  . CKD (chronic kidney disease) stage 3, GFR 30-59 ml/min 04/12/2017  . Neuropathy 02/25/2017  . Urinary frequency 09/19/2016  . Constipation 11/27/2015  . Medicare annual wellness visit, initial 09/23/2015  . Advance care planning 09/23/2015  . Spinal stenosis of lumbar region 01/26/2015  . History of colonic polyps   . Restless leg syndrome 08/11/2014  . ICD- MDT, implanted 11/13/13 11/14/2013  . Chronic systolic CHF (congestive heart failure) ([place]) 11/13/2013  . Acute on chronic combined systolic and diastolic congestive heart failure, NYHA class 4 ([place]) 05/12/2013  . 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 Elevated PSA 12/04/2012  . Other malaise and fatigue 12/04/2012  . PD (Parkinson's disease) ([place]) 08/19/2012  . OSA on CPAP 04/17/2012  . Dysautonomia ([place]) 04/17/2012  . Obesity, morbid ([place]) 04/17/2012  . Hypothyroidism following radioiodine therapy 10/16/2011  . DYSPNEA/SHORTNESS OF BREATH 11/09/2009  . GERD 05/11/2009  . IDDM (insulin dependent diabetes mellitus) ([place]) 04/26/2009  . OTHER SPEC FORMS CHRONIC ISCHEMIC HEART DISEASE 04/24/2008  . HYPERPLASIA PROSTATE UNS W/O UR OBST & OTH LUTS 09/02/2007  . NEPHROLITHIASIS, HX OF 09/02/2007  . HERNIA, VENTRAL 12/28/2006  . Hyperlipidemia 08/31/2006   . HTN (hypertension) 08/31/2006  . Hx of CABG x 3 in 2000 08/31/2006    Patient states that symptoms started more than 10 days ago. Symptoms tier reviewed as well as criteria for ending isolation. Preventative practices reviewed. Patient verbalized understanding.    Patient advised to go to Urgent care or ED with severe symptoms.

## 2019-01-22 ENCOUNTER — Ambulatory Visit (INDEPENDENT_AMBULATORY_CARE_PROVIDER_SITE_OTHER): Payer: Medicare Other | Admitting: *Deleted

## 2019-01-22 DIAGNOSIS — I5022 Chronic systolic (congestive) heart failure: Secondary | ICD-10-CM

## 2019-01-22 LAB — CUP PACEART REMOTE DEVICE CHECK
Battery Remaining Longevity: 59 mo
Battery Voltage: 2.98 V
Brady Statistic AP VP Percent: 0.01 %
Brady Statistic AP VS Percent: 1.22 %
Brady Statistic AS VP Percent: 0.06 %
Brady Statistic AS VS Percent: 98.72 %
Brady Statistic RA Percent Paced: 1.22 %
Brady Statistic RV Percent Paced: 0.06 %
Date Time Interrogation Session: 20210113033323
HighPow Impedance: 69 Ohm
Implantable Lead Implant Date: 20151105
Implantable Lead Implant Date: 20151105
Implantable Lead Location: 753859
Implantable Lead Location: 753860
Implantable Lead Model: 5076
Implantable Pulse Generator Implant Date: 20151105
Lead Channel Impedance Value: 437 Ohm
Lead Channel Impedance Value: 437 Ohm
Lead Channel Impedance Value: 494 Ohm
Lead Channel Pacing Threshold Amplitude: 0.375 V
Lead Channel Pacing Threshold Amplitude: 1.125 V
Lead Channel Pacing Threshold Pulse Width: 0.4 ms
Lead Channel Pacing Threshold Pulse Width: 0.4 ms
Lead Channel Sensing Intrinsic Amplitude: 1.25 mV
Lead Channel Sensing Intrinsic Amplitude: 1.25 mV
Lead Channel Sensing Intrinsic Amplitude: 15.25 mV
Lead Channel Sensing Intrinsic Amplitude: 15.25 mV
Lead Channel Setting Pacing Amplitude: 2.25 V
Lead Channel Setting Pacing Amplitude: 2.5 V
Lead Channel Setting Pacing Pulse Width: 0.4 ms
Lead Channel Setting Sensing Sensitivity: 0.3 mV

## 2019-01-23 ENCOUNTER — Other Ambulatory Visit: Payer: Self-pay | Admitting: Endocrinology

## 2019-01-27 ENCOUNTER — Ambulatory Visit: Payer: Medicare Other | Admitting: Endocrinology

## 2019-02-04 ENCOUNTER — Other Ambulatory Visit: Payer: Self-pay

## 2019-02-05 ENCOUNTER — Ambulatory Visit (INDEPENDENT_AMBULATORY_CARE_PROVIDER_SITE_OTHER): Payer: Medicare Other | Admitting: Endocrinology

## 2019-02-05 ENCOUNTER — Encounter: Payer: Self-pay | Admitting: Endocrinology

## 2019-02-05 VITALS — BP 118/54 | HR 82 | Ht 67.0 in | Wt 255.8 lb

## 2019-02-05 DIAGNOSIS — E782 Mixed hyperlipidemia: Secondary | ICD-10-CM

## 2019-02-05 DIAGNOSIS — E890 Postprocedural hypothyroidism: Secondary | ICD-10-CM

## 2019-02-05 DIAGNOSIS — E1165 Type 2 diabetes mellitus with hyperglycemia: Secondary | ICD-10-CM | POA: Diagnosis not present

## 2019-02-05 DIAGNOSIS — Z794 Long term (current) use of insulin: Secondary | ICD-10-CM

## 2019-02-05 LAB — POCT GLYCOSYLATED HEMOGLOBIN (HGB A1C): Hemoglobin A1C: 8.2 % — AB (ref 4.0–5.6)

## 2019-02-05 LAB — GLUCOSE, POCT (MANUAL RESULT ENTRY): POC Glucose: 169 mg/dl — AB (ref 70–99)

## 2019-02-05 NOTE — Progress Notes (Signed)
Patient ID: Henry [name], male   DOB: 1950-02-21, 69 y.o.   MRN: [id number]           Reason for Appointment: Follow-up for Type 2 Diabetes   History of Present Illness:          Diagnosis: Type 2 diabetes mellitus, date of diagnosis: 2000      Past history:   Patient thinks he has been taking Amaryl for several years and probably metformin since onset also At some point he was changed from metformin to [facility] and was taking this since at least 2012 A1c had been higher in 2015 Since his A1c had been progressively higher with his regimen of Janumet and Amaryl he was started on Victoza in 01/2014 He was started on Lantus insulin in 5/16 because of persistent hyperglycemia especially fasting; was having readings as high as 293 Because of tendency to high postprandial readings and for control with Lantus he was switched to the V-go pump in  09/2014  Recent history:    INSULIN regimen: Omnipod insulin pump with Humalog insulin  Basal rate programs: Midnight = 1.7, 7 AM = 1.95, 5 PM = 2.15, total basal 44 units  Boluses 4.5 units for breakfast, 4-6 lunch and 6-9 units for dinner  Non-insulin hypoglycemic drugs the patient is taking are: Ozempic 0.5 mg weekly  His A1c has been consistently over 8% and now 8.2, has been as low as 7.2  Current management, blood sugar patterns and problems identified:  His daytime basal rate was increased on his last visit  Difficult to adjust his blood sugar patterns since he mostly checks blood sugars in the morning  Still using the PDM to check his blood sugar with freestyle test strips  In the last week blood sugars are generally much better than the previous week when they were high throughout the day  However he is not taking blood sugars in the last few days in the evenings and bolusing only based on carbohydrate  He is taking BOLUSES of various amounts and appears to be adjusting the evening dose based on his carbohydrates  Today blood  sugar after lunch bolus was 169  However has not taken correction doses for any high blood sugars done later after dinner  Has only 1 blood sugar after dinner but does not monitor postprandially  He thinks that he is cutting back on nighttime snacks which was raising his blood sugars in the mornings previously  Also in the last month he has been on Ozempic and has been on 0.5 mg dose only twice so far  Although some of his meals are relatively high in fact he is not doing this consistently  Also avoiding regular soft drinks and sweets recently  In early January he may have had some prednisone which raised his blood sugar to 300 but did not call for advice  Hypoglycemia:   none  He has seen the dietitian in 04/2017  Not able to exercise because of back pain and difficulty walking       Side effects from medications have been: None Compliance with the medical regimen: Fair   Glucose monitoring:  Recently less than 2 times  a day         Glucometer:  Freestyle       Blood Glucose readings by download of pump    PRE-MEAL Fasting Lunch Dinner Bedtime Overall  Glucose range:  122-244  200  201-304    Mean/median:  195  250  215   POST-MEAL PC Breakfast PC Lunch PC Dinner  Glucose range:    246  Mean/median:      PREVIOUS readings:  PRE-MEAL Fasting Lunch Dinner Bedtime Overall  Glucose range:  111-219   147-243    Mean/median: 160   183  180   POST-MEAL PC Breakfast PC Lunch PC Dinner  Glucose range:   280  190, 224  Mean/median:        Meals: 9 am , 2-3 pm and 6-7 pm            Dietician visit, most recent: 4/19               Weight history:   Wt Readings from Last 3 Encounters:  02/05/19 255 lb 12.8 oz (116 kg)  10/17/18 263 lb 8 oz (119.5 kg)  10/14/18 251 lb (113.9 kg)    Glycemic control:   Lab Results  Component Value Date   HGBA1C 8.2 (A) 02/05/2019   HGBA1C 8.3 (H) 08/16/2018   HGBA1C 8.5 (H) 06/14/2018   Lab Results  Component Value Date    MICROALBUR <0.7 06/14/2018   LDLCALC 118 (H) 09/16/2015   CREATININE 1.94 (H) 08/16/2018    Lab Results  Component Value Date   FRUCTOSAMINE 277 11/09/2017   FRUCTOSAMINE 350 (H) 03/29/2017   FRUCTOSAMINE 250 09/16/2015     Other active problems: See review of systems      Allergies as of 02/05/2019      Reactions   Penicillins Anaphylaxis, Rash   Because of a history of documented adverse serious drug reaction;Medi Alert bracelet  is recommended PATIENT HAS HAD A PCN REACTION WITH IMMEDIATE RASH, FACIAL/TONGUE/THROAT SWELLING, SOB, OR LIGHTHEADEDNESS WITH HYPOTENSION:  #  #  YES  #  #  Has patient had a PCN reaction causing severe rash involving mucus membranes or skin necrosis: unknown Has patient had a PCN reaction that required hospitalization NO Has patient had a PCN reaction occurring within the last 10 years: NO   Klonopin [clonazepam] Other (See Comments)   agitation   Peanut-containing Drug Products Cough   Watermelon [citrullus Vulgaris] Other (See Comments), Cough   Tickle in throat      Medication List       Accurate as of February 05, 2019  8:38 PM. If you have any questions, ask your nurse or doctor.        acetaminophen 500 MG tablet Commonly known as: TYLENOL Take 500 mg by mouth 2 (two) times daily as needed for mild pain or headache.   albuterol 108 (90 Base) MCG/ACT inhaler Commonly known as: VENTOLIN HFA Inhale 2 puffs into the lungs every 6 (six) hours as needed for wheezing or shortness of breath.   allopurinol 100 MG tablet Commonly known as: ZYLOPRIM Take 1.5 tablets (150 mg total) by mouth daily.   AMBULATORY NON FORMULARY MEDICATION Ustep walker DX: G20   aspirin EC 81 MG tablet Take 81 mg by mouth daily.   Carbidopa-Levodopa ER 25-100 MG tablet controlled release Commonly known as: SINEMET CR 2 tablets at 7am/2 tablets at 11am/2 at 3pm/1 at 7pm/1 at bedtime What changed:   how much to take  how to take this  when to take  this  additional instructions   colchicine 0.6 MG tablet Take 1 tablet (0.6 mg total) by mouth 2 (two) times daily as needed (gout).   ferrous sulfate 325 (65 FE) MG EC tablet Take 325 mg by mouth daily.  Fluticasone-Salmeterol 250-50 MCG/DOSE Aepb Commonly known as: Wixela Inhub INHALE 1 PUFF INTO THE LUNGS 2 TIMES DAILY.   furosemide 80 MG tablet Commonly known as: LASIX TAKE 1 & 1/2 TABLETS BY MOUTH DAILY What changed:   how much to take  when to take this   gabapentin 100 MG capsule Commonly known as: NEURONTIN TAKE 2 CAPSULES BY MOUTH EVERY MORNING AND TAKE 4 CAPSULES AT BEDTIME What changed:   how much to take  how to take this  when to take this   glucose blood test strip Commonly known as: OneTouch Verio USE AS DIRECTED TO CHECK BLOOD SUGAR TWICE A DAY. DX:E11.65   FREESTYLE LITE test strip Generic drug: glucose blood USE TO CHECK BLOOD SUGAR 4 TIMES DAILY.   HumaLOG 100 UNIT/ML injection Generic drug: insulin lispro USE MAXIMUM 76 UNITS PER   DAY WITH V-GO PUMP   Klor-Con M20 20 MEQ tablet Generic drug: potassium chloride SA TAKE 2 TABLETS BY MOUTH DAILY   levothyroxine 125 MCG tablet Commonly known as: SYNTHROID TAKE 1 TABLET BY MOUTH EVERY DAY BEFORE BREAKFAST   lisinopril 2.5 MG tablet Commonly known as: ZESTRIL Take 1 tablet (2.5 mg total) by mouth daily.   metolazone 2.5 MG tablet Commonly known as: ZAROXOLYN TAKE 1 TABLET BY MOUTH ONCE A WEEK. What changed:   how much to take  how to take this  when to take this  additional instructions   metoprolol succinate 50 MG 24 hr tablet Commonly known as: TOPROL-XL TAKE 1 TABLET BY MOUTH DAILY. TAKE WITH OR IMMEDIATELY FOLLOWING A MEAL. What changed: See the new instructions.   omega-3 acid ethyl esters 1 g capsule Commonly known as: LOVAZA TAKE 2 CAPSULES (2GRAMS TOTAL) BY MOUTH TWICE DAILY.   OmniPod Dash System Kit 1 each by Does not apply route. Use Dash system for Continuous  Blood Glucose Monitoring.   OVER THE COUNTER MEDICATION Apply 1 application topically at bedtime. Theraworx Pain Cream   Ozempic (0.25 or 0.5 MG/DOSE) 2 MG/1.5ML Sopn Generic drug: Semaglutide(0.25 or 0.'5MG'$ /DOS) Inject 0.5 mg into the skin once a week. Inject 0.'25mg'$  under the skin once weekly for 2 weeks and then increase to 0.'5mg'$  weekly.   polyethylene glycol 17 g packet Commonly known as: MIRALAX / GLYCOLAX Take 17 g by mouth daily.   rosuvastatin 20 MG tablet Commonly known as: CRESTOR TAKE 1 TABLET BY MOUTH EVERY DAY   traMADol 50 MG tablet Commonly known as: ULTRAM TAKE 1 TABLET BY MOUTH 2 TIMES DAILY AS NEEDED FOR MODERATE PAIN   travoprost (benzalkonium) 0.004 % ophthalmic solution Commonly known as: TRAVATAN Place 1 drop into the right eye at bedtime.   vitamin B-12 1000 MCG tablet Commonly known as: CYANOCOBALAMIN Take 1,000 mcg by mouth daily.   Vitamin D 50 MCG (2000 UT) tablet Take 2,000 Units by mouth daily.       Allergies:  Allergies  Allergen Reactions  . Penicillins Anaphylaxis and Rash    Because of a history of documented adverse serious drug reaction;Medi Alert bracelet  is recommended PATIENT HAS HAD A PCN REACTION WITH IMMEDIATE RASH, FACIAL/TONGUE/THROAT SWELLING, SOB, OR LIGHTHEADEDNESS WITH HYPOTENSION:  #  #  YES  #  #  Has patient had a PCN reaction causing severe rash involving mucus membranes or skin necrosis: unknown Has patient had a PCN reaction that required hospitalization NO Has patient had a PCN reaction occurring within the last 10 years: NO  . Klonopin [Clonazepam] Other (See Comments)  agitation  . Peanut-Containing Drug Products Cough  . Watermelon [Citrullus Vulgaris] Other (See Comments) and Cough    Tickle in throat    Past Medical History:  Diagnosis Date  . AICD (automatic cardioverter/defibrillator) present    Dr Lovena Le office visit yearly, MDT   . Arthritis    cane  . Asthma   . Benign neoplasm of colon   .  CAD (coronary artery disease)   . Cardiomyopathy   . Cataract   . Complication of anesthesia    pt states that he got a rash  . Constipation   . 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 DM (diabetes mellitus) ([place])    TYPE 2   . GERD (gastroesophageal reflux disease)   . Glaucoma    right eye  . HLD (hyperlipidemia)   . HTN (hypertension)    pt denies 08/19/12  . Hyperplasia, prostate   . Hyperthyroidism    thyroid lobectomy  . MI (myocardial infarction) [facility])    Dr Stanford Breed 2000, x3vessels bypass  . Nephrolithiasis   . OSA (obstructive sleep apnea)    AHI-28,on CPAP, noncompliant with CPAP  . Parkinson disease ([place])    1999  . PONV (postoperative nausea and vomiting)   . Restless legs   . Shortness of breath    Hx: of at all times  . Sleep apnea    does not use CPAP  . Sleep apnea, organic   . UTI (lower urinary tract infection) 09/15/12   Klebsiella  . Ventral hernia     Past Surgical History:  Procedure Laterality Date  . 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 BIOPSY  10/14/2018   Procedure: BIOPSY;  Surgeon: Jerene Bears, MD;  Location: [facility];  Service: Gastroenterology;;  . CARDIAC CATHETERIZATION    . CATARACT EXTRACTION W/ INTRAOCULAR LENS IMPLANT     Hx: of right eye  . CATARACT EXTRACTION W/ INTRAOCULAR LENS IMPLANT Left 2018  . COLONOSCOPY N/A 10/13/2014   Procedure: COLONOSCOPY;  Surgeon: Jerene Bears, MD;  Location: [facility];  Service: Gastroenterology;  Laterality: N/A;  . COLONOSCOPY W/ BIOPSIES AND POLYPECTOMY     Hx: of  . COLONOSCOPY WITH PROPOFOL N/A 10/14/2018   Procedure: COLONOSCOPY WITH PROPOFOL;  Surgeon: Jerene Bears, MD;  Location: [facility] ENDOSCOPY;  Service: Gastroenterology;  Laterality: N/A;  . CORONARY ARTERY BYPASS GRAFT  2000   [name], MD  . [place]  . DEEP BRAIN STIMULATOR PLACEMENT  2004   Right and left VIN stimulator placement (parkinsons)  . EYE SURGERY      . FINGER AMPUTATION     left pointer  . IMPLANTABLE CARDIOVERTER DEFIBRILLATOR IMPLANT N/A 11/13/2013   Procedure: IMPLANTABLE CARDIOVERTER DEFIBRILLATOR IMPLANT;  Surgeon: Evans Lance, MD;  Location: [facility] C[facility] LAB;  Service: Cardiovascular;  Laterality: N/A;  . INSERT / REPLACE / REMOVE PACEMAKER    . LEFT AND RIGHT HEART CATHETERIZATION WITH CORONARY ANGIOGRAM N/A 09/24/2013   Procedure: LEFT AND RIGHT HEART CATHETERIZATION WITH CORONARY ANGIOGRAM;  Surgeon: Burnell Blanks, MD;  Location: [facility] C[facility] LAB;  Service: Cardiovascular;  Laterality: N/A;  . LITHOTRIPSY     3 different times  . MEDIAN STERNOTOMY  2000  . POLYPECTOMY  10/14/2018   Procedure: POLYPECTOMY;  Surgeon: Jerene Bears, MD;  Location: [facility];  Service: Gastroenterology;;  . PULSE GENERATOR IMPLANT Right 11/13/2017   Procedure: Right chest implantable  pulse generator change;  Surgeon: Erline Levine, MD;  Location: [place];  Service: Neurosurgery;  Laterality: Right;  Right chest implantable pulse generator change  . SUBTHALAMIC STIMULATOR BATTERY REPLACEMENT N/A 09/05/2012   Procedure: Deep brain stimulator battery change;  Surgeon: Erline Levine, MD;  Location: [place] [facility]O [facility];  Service: Neurosurgery;  Laterality: N/A;  Deep brain stimulator battery change  . SUBTHALAMIC STIMULATOR BATTERY REPLACEMENT N/A 06/10/2015   Procedure: Deep Brain stimulator battery change;  Surgeon: Erline Levine, MD;  Location: [place] NEURO ORS;  Service: Neurosurgery;  Laterality: N/A;  . TONSILLECTOMY      Family History  Problem Relation Age of Onset  . Aneurysm Mother   . Alcoholism Father   . HIV/AIDS Brother 55       AIDS  . Healthy Sister   . Healthy Child   . Peripheral vascular disease Other   . Arthritis Other   . Healthy Sister   . Healthy Child   . Healthy Child   . Diabetes Neg Hx   . Heart disease Neg Hx   . Colon cancer Neg Hx   . Prostate cancer Neg Hx     Social History:  reports that he has never smoked. He has never  used smokeless tobacco. He reports previous alcohol use. He reports that he does not use drugs.    Review of Systems      HYPERLIPIDEMIA:  He is on Crestor 20 mg with good control of LDL, has low HDL Triglycerides are persistently over 200, treated with Lovaza 2 g twice a day No recent fasting labs available      Lab Results  Component Value Date   CHOL 116 09/18/2018   CHOL 130 03/12/2018   CHOL 123 04/04/2017   Lab Results  Component Value Date   HDL 25.80 (L) 09/18/2018   HDL 29.00 (L) 03/12/2018   HDL 32.30 (L) 04/04/2017   Lab Results  Component Value Date   LDLCALC 118 (H) 09/16/2015   LDLCALC 126 (H) 04/08/2013   LDLCALC 51 02/28/2012   Lab Results  Component Value Date   TRIG 270.0 (H) 09/18/2018   TRIG 257.0 (H) 03/12/2018   TRIG 232.0 (H) 04/04/2017   Lab Results  Component Value Date   CHOLHDL 4 09/18/2018   CHOLHDL 4 03/12/2018   CHOLHDL 4 04/04/2017   Lab Results  Component Value Date   LDLDIRECT 54.0 09/18/2018   LDLDIRECT 67.0 03/12/2018   LDLDIRECT 61.0 04/04/2017                  Thyroid:   He has had hypothyroidism following radioactive iodine treatment for hyperthyroidism in 2013  last TSH is  normal on 125ug   Lab Results  Component Value Date   TSH 2.64 08/16/2018   TSH 3.51 03/12/2018   TSH 5.68 (H) 11/09/2017   FREET4 0.88 08/16/2018   FREET4 0.75 03/12/2018   FREET4 0.72 11/09/2017       HYPERTENSION has been followed by  other physicians, reassured him that his blood pressure tends to be low normal  BP Readings from Last 3 Encounters:  02/05/19 (!) 118/54  01/02/19 98/60  10/17/18 120/62     CKD: Has variable creatinine levels, recently high.  Does have a nephrologist   Lab Results  Component Value Date   CREATININE 1.94 (H) 08/16/2018   CREATININE 1.68 (H) 07/01/2018   CREATININE 2.31 (H) 06/14/2018    Neuropathy: Taking gabapentin low doses from his PCP. He thinks he  has has some pain in his feet but also  apparently does get relief with some steroid injection in his feet from the podiatrist at times Also has had some history of numbness in his toes  Diabetic foot exam in 08/2018  Last eye exam 11/20, negative  Physical Examination:  BP (!) 118/54 (BP Location: Left Arm, Patient Position: Sitting, Cuff Size: Large)   Pulse 82   Ht '5\' 7"'$  (1.702 m)   Wt 255 lb 12.8 oz (116 kg)   SpO2 94%   BMI 40.06 kg/m      ASSESSMENT/PLAN  Diabetes type 2, insulin-dependent with obesity     See history of present illness for detailed discussion of his current management, blood sugar patterns and problems identified  His A1c is still persistently over 8%   He is using the Omni pod insulin pump with the older PDM and now Ozempic instead of Victoza   He is mostly checking his blood sugars in the mornings especially in the last few days  His blood sugar may be improving in the last week with increasing his Ozempic dose  However not clear if his evening readings are also improving although today after lunch his blood sugar is excellent  His diet has been reasonably good although still has difficulty losing weight  He is bolusing consistently at mealtimes although not always with entering his blood sugar  Also needs to check blood sugars after supper to help adjust his suppertime dose and also need to take correction boluses as needed  His management was discussed as above No further changes made in his basal or bolus settings at this time However asked his wife to call if in 1 week his blood sugars are still above target before dinner or lunch consistently  Bolus for any snacks with 2 to 3 units  Posttreatment hypothyroidism: Previously well controlled on levothyroxine 125, and will need follow-up labs  Lipids: Needs follow-up labs, and will try to go fasting with his PCP office to have his labs drawn    Follow-up in 3 months    Patient Instructions  Check blood sugars on waking  up 5 days a week  Also check blood sugars about 2 hours after meals and do this after different meals by rotation  Recommended blood sugar levels on waking up are 90-130 and about 2 hours after meal is 130-180  Please bring your blood sugar monitor to each visit, thank you  Call in 1 week if over range all the time         Elayne Snare 02/05/2019, 8:38 PM   Note: This office note was prepared with Dragon voice recognition system technology. Any transcriptional errors that result from this process are unintentional.

## 2019-02-05 NOTE — Patient Instructions (Addendum)
Check blood sugars on waking up 5 days a week  Also check blood sugars about 2 hours after meals and do this after different meals by rotation  Recommended blood sugar levels on waking up are 90-130 and about 2 hours after meal is 130-180  Please bring your blood sugar monitor to each visit, thank you  Call in 1 week if over range all the time

## 2019-02-23 ENCOUNTER — Other Ambulatory Visit: Payer: Self-pay | Admitting: Internal Medicine

## 2019-02-23 DIAGNOSIS — I509 Heart failure, unspecified: Secondary | ICD-10-CM

## 2019-02-24 ENCOUNTER — Telehealth: Payer: Self-pay

## 2019-02-24 NOTE — Telephone Encounter (Signed)
Juliann Pulse RN with [employer] thru [employer] left v/m that pt has had numerous falls since the holidays. Pt has previously had rt shoulder injury, groin pull and multiple bruises. Juliann Pulse wanted to make sure Dr Damita Dunnings was aware of multiple falls; pt presently has a gout flair up. No further info left I called and spoke with pt's wife and left foot is hurting and does appear little puffy;no redness; Mrs Sisak started giving pt colchicine on 02/23/19 and pt s wife said usually takes 3 days on colchicine and pt is over gout attack. Last gout flair up was about 2 months. Pt is continuing to take allopurinol; . Pt last seen by Dr Damita Dunnings on 08/23/2018. At that visit no recent colchicine was used for gout; pt was taking allopurinol. Pt does not have future appt. Pt and pts wife said would cb if symptoms do not improve while taking the colchicine. FYI to Dr Damita Dunnings.

## 2019-02-24 NOTE — Telephone Encounter (Signed)
Please offer PT re: falls (I can refer if needed) and update me as needed.  Thanks.

## 2019-02-24 NOTE — Telephone Encounter (Signed)
Patient advised and says he has done PT twice and it helps some but then he still continues to fall.  Patient has exercises to do when he is able to do them.  Patient says his arthritis is making his joints really stiff and that is contributing to his falls.  Does he need an appointment in the office or a phone visit to address this?

## 2019-02-25 ENCOUNTER — Other Ambulatory Visit: Payer: Self-pay | Admitting: Family Medicine

## 2019-02-25 ENCOUNTER — Encounter: Payer: Self-pay | Admitting: Family Medicine

## 2019-02-25 NOTE — Telephone Encounter (Signed)
Left detailed message on voicemail of Henry [name] [facility])

## 2019-02-25 NOTE — Telephone Encounter (Signed)
Reasonable to offer office visit but the problem is (due to his other medical problems) options are limited for arthritis/pain treatment.  Please see if wants to come in to discuss options (or schedule by phone if needed).  Thanks.

## 2019-02-25 NOTE — Telephone Encounter (Signed)
Electronic refill request.   Last office visit:   02/05/2019 Last Filled:   540 capsule 1 08/15/2018  Please advise.

## 2019-02-26 NOTE — Telephone Encounter (Signed)
Sent. Thanks.   

## 2019-03-18 NOTE — Progress Notes (Signed)
Henry [name] was seen today in follow up for Parkinsons disease.  My previous records were reviewed prior to todays visit as well as outside records available to me.  Medical records have been reviewed since last visit.  Unfortunately, patient had numerous falls. Pt states that he hasn't had any falls in a month but pts wife states that isn't true.  She does state that they have been "stumbles" and not serious falls.  He is having feet cramping at night.  Pt denies lightheadedness, near syncope.  Separate from the tremor, he has what he describes as jerking of the right leg.  He also has freezing and start hesitation, especially in his bathroom.  Current prescribed movement disorder medications: carbidopa/levodopa 25/100 cr, 2 tablets at 7 AM, 2 tablets at 11 AM, 2 tablets at 3 PM, 1 tablet at 7 PM and 1 tablet at bedtime (increased last visit from 6 tablets/day to 8 tablets/day)   PREVIOUS MEDICATIONS: Mirapex and artane, levodopa; clonazepam (overly sleepy);  ALLERGIES:   Allergies  Allergen Reactions  . Penicillins Anaphylaxis and Rash    Because of a history of documented adverse serious drug reaction;Medi Alert bracelet  is recommended PATIENT HAS HAD A PCN REACTION WITH IMMEDIATE RASH, FACIAL/TONGUE/THROAT SWELLING, SOB, OR LIGHTHEADEDNESS WITH HYPOTENSION:  #  #  YES  #  #  Has patient had a PCN reaction causing severe rash involving mucus membranes or skin necrosis: unknown Has patient had a PCN reaction that required hospitalization NO Has patient had a PCN reaction occurring within the last 10 years: NO  . Klonopin [Clonazepam] Other (See Comments)    agitation  . Peanut-Containing Drug Products Cough  . Watermelon [Citrullus Vulgaris] Other (See Comments) and Cough    Tickle in throat    CURRENT MEDICATIONS:  Outpatient Encounter Medications as of 03/20/2019  Medication Sig  . acetaminophen (TYLENOL) 500 MG tablet Take 500 mg by mouth 2 (two) times daily as needed for  mild pain or headache.   . albuterol (VENTOLIN HFA) 108 (90 Base) MCG/ACT inhaler Inhale 2 puffs into the lungs every 6 (six) hours as needed for wheezing or shortness of breath.  . allopurinol (ZYLOPRIM) 100 MG tablet Take 1.5 tablets (150 mg total) by mouth daily.  . AMBULATORY NON FORMULARY MEDICATION Ustep walker DX: G20  . aspirin EC 81 MG tablet Take 81 mg by mouth daily.  . Carbidopa-Levodopa ER (SINEMET CR) 25-100 MG tablet controlled release TAKE 2 TABLETS AT 7AM,THEN 2TABLETS AT 11AM,THEN 2 TABS AT 3PM AND 1TAB AT 7PM,&1TAB AT BEDTIME  . Cholecalciferol (VITAMIN D) 50 MCG (2000 UT) tablet Take 2,000 Units by mouth daily.   . colchicine 0.6 MG tablet Take 1 tablet (0.6 mg total) by mouth 2 (two) times daily as needed (gout).  . ferrous sulfate 325 (65 FE) MG EC tablet Take 325 mg by mouth daily.   . Fluticasone-Salmeterol (WIXELA INHUB) 250-50 MCG/DOSE AEPB INHALE 1 PUFF INTO THE LUNGS 2 TIMES DAILY.  Marland Kitchen FREESTYLE LITE test strip USE TO CHECK BLOOD SUGAR 4 TIMES DAILY.  . furosemide (LASIX) 80 MG tablet TAKE 1 & 1/2 TABLETS BY MOUTH DAILY (Patient taking differently: Take 80 mg by mouth 2 (two) times daily. )  . gabapentin (NEURONTIN) 100 MG capsule TAKE 2 CAPSULES BY MOUTH EVERY MORNING AND TAKE 4 CAPSULES AT BEDTIME  . glucose blood (ONETOUCH VERIO) test strip USE AS DIRECTED TO CHECK BLOOD SUGAR TWICE A DAY. DX:E11.65  . HUMALOG 100 UNIT/ML injection  USE MAXIMUM 76 UNITS PER   DAY WITH V-GO PUMP  . Insulin Disposable Pump (OMNIPOD DASH SYSTEM) KIT 1 each by Does not apply route. Use Dash system for Continuous Blood Glucose Monitoring.  . KLOR-CON M20 20 MEQ tablet TAKE 2 TABLETS BY MOUTH DAILY  . levothyroxine (SYNTHROID) 125 MCG tablet TAKE 1 TABLET BY MOUTH EVERY DAY BEFORE BREAKFAST  . lisinopril (ZESTRIL) 2.5 MG tablet TAKE 1 TABLET BY MOUTH EVERY DAY  . metolazone (ZAROXOLYN) 2.5 MG tablet TAKE 1 TABLET BY MOUTH ONCE A WEEK. (Patient taking differently: Take 2.5 mg by mouth every  Friday. )  . metoprolol succinate (TOPROL-XL) 50 MG 24 hr tablet TAKE 1 TABLET BY MOUTH DAILY. TAKE WITH OR IMMEDIATELY FOLLOWING A MEAL. (Patient taking differently: Take 50 mg by mouth every evening. )  . omega-3 acid ethyl esters (LOVAZA) 1 g capsule TAKE 2 CAPSULES (2GRAMS TOTAL) BY MOUTH TWICE DAILY.  . OVER THE COUNTER MEDICATION Apply 1 application topically at bedtime. Theraworx Pain Cream  . polyethylene glycol (MIRALAX / GLYCOLAX) packet Take 17 g by mouth daily.   . rosuvastatin (CRESTOR) 20 MG tablet TAKE 1 TABLET BY MOUTH EVERY DAY  . Semaglutide,0.25 or 0.5MG/DOS, (OZEMPIC, 0.25 OR 0.5 MG/DOSE,) 2 MG/1.5ML SOPN Inject 0.5 mg into the skin once a week. Inject 0.25mg under the skin once weekly for 2 weeks and then increase to 0.5mg weekly.  . traMADol (ULTRAM) 50 MG tablet TAKE 1 TABLET BY MOUTH 2 TIMES DAILY AS NEEDED FOR MODERATE PAIN  . travoprost, benzalkonium, (TRAVATAN) 0.004 % ophthalmic solution Place 1 drop into the right eye at bedtime.   . vitamin B-12 (CYANOCOBALAMIN) 1000 MCG tablet Take 1,000 mcg by mouth daily.  . [DISCONTINUED] Carbidopa-Levodopa ER (SINEMET CR) 25-100 MG tablet controlled release 2 tablets at 7am/2 tablets at 11am/2 at 3pm/1 at 7pm/1 at bedtime (Patient taking differently: Take 1-2 tablets by mouth See admin instructions. 2 tablets at 7am, 2 tablets at 11am, 2 at 3pm, 1 at 7pm, 1 at bedtime.)   No facility-administered encounter medications on file as of 03/20/2019.    PHYSICAL EXAMINATION:    VITALS:   Vitals:   03/20/19 1253  BP: 102/64  Pulse: 74  SpO2: 97%  Weight: 256 lb (116.1 kg)  Height: 5' 9" (1.753 m)    GEN:  The patient appears stated age and is in NAD. HEENT:  Normocephalic, atraumatic.  The mucous membranes are moist. The superficial temporal arteries are without ropiness or tenderness. CV:  RRR Lungs:  CTAB Neck/HEME:  There are no carotid bruits bilaterally.  Neurological examination:  Orientation: The patient is alert  and oriented x3. Cranial nerves: There is good facial symmetry with facial hypomimia. The speech is fluent and dysarthric. Soft palate rises symmetrically and there is no tongue deviation. Hearing is intact to conversational tone. Sensation: Sensation is intact to light touch throughout Motor: Strength is at least antigravity x4.  Movement examination: Tone: There is normal tone in the upper and lower extremities Abnormal movements: None Coordination:  There is  decremation with RAM's, and opening and closing and finger taps Gait and Station: The patient walked fairly well with his walker today, although he did have a flexed posture    Chemistry      Component Value Date/Time   NA 139 08/16/2018 0916   NA 142 12/22/2016 1233   K 3.7 08/16/2018 0916   CL 93 (L) 08/16/2018 0916   CO2 37 (H) 08/16/2018 0916   BUN 31 (  H) 08/16/2018 0916   BUN 18 07/01/2018 0000   CREATININE 1.94 (H) 08/16/2018 0916   CREATININE 2.13 06/25/2017 0000   CREATININE 1.53 (H) 05/22/2016 1106      Component Value Date/Time   CALCIUM 9.6 08/16/2018 0916   ALKPHOS 104 08/16/2018 0916   AST 8 08/16/2018 0916   ALT 8 08/16/2018 0916   BILITOT 0.5 08/16/2018 0916        ASSESSMENT/PLAN:  1.  Parkinsons Disease  -Patient is status post bilateral DBS STN surgery             -His DBS battery was last changed on 09/05/2012 and on 06/10/15 and November 13, 2017.  Battery looked good today.             -use walker at all times.  Do not think that medication changes unfortunately are going to help his falls.  He needs to have somebody with him at all times, or he will need to stay seated in a wheelchair.  He and I had a long discussion about the importance of exercise while staying safely seated (he has an exercise bike, but he does not use it).  We also talked about hiring a trainer to help him work out.  He is really getting deconditioned.  -Take carbidopa/levodopa CR 25/100, 2 tablets at 7 AM/2 tablets at 11 AM/2  tablets at 3 PM/1 tablet at 7 PM, but discontinue the bedtime dosage  -Add carbidopa/levodopa 50/200 CR at bedtime.  Hoping that this increased dose at bedtime will help the feet and leg cramping at bedtime. 2.  RBD             -He has tried clonazepam several times and ultimately ends up stopping it because of side effect.  Could try Rozerem in the future if insurance would pay.  Decided to hold off for now as the patient states that while dreaming is vivid, it is not particularly bothersome and he is not falling out of the bed.  Discussed bedroom safety. 3.  Pseudobulbar affect (crying)             -Wants no medication for that. 4.  OSAS, noncompliant with CPAP.             -Unfortunately, he has been unable to tolerate CPAP.  Talked about dental appliances, which he does not wish to try either. 5.  CHF             -ICD placed on 11/15/2013 6.  Facial paresthesias             -Has been going on for about a year.  He has had several negative noncontrast CTs of the brain.  Kidney function is not good enough for contrasted CT.  His DBS device is not MRI compatible.             -In the past, and wondered if this was not trigeminal neuralgia, but today he was describing it as full face, as if he was wearing a ski mask.  I am unsure what this is, and offered to try carbamazepine, but he really did not want that.             -I have turned off his DBS and he noticed no change in the facial paresthesias. 7.  Sialorrhea             -This is commonly associated with PD.  We talked about treatments.  The patient is not a candidate   for oral anticholinergic therapy because of increased risk of confusion and falls.  We discussed Botox (type A and B) and 1% atropine drops.  We discusssed that candy like lemon drops can help by stimulating mm of the oropharynx to induce swallowing.  He does not want any treatment right now. 8.  Day/night reversal             -Long discussion with the patient regarding the  importance of staying awake during the day and sleeping at night.  Discussed that this can ultimately cause confusion and hallucinations. 9.  ? Myoclonus  -wonder if from gabapentin, especially given that his kidney function is increasing somewhat.  I did send not to PCP about it  -has appt with nephrology next week  Total time spent on today's visit was 45 minutes, including both face-to-face time and nonface-to-face time.  Time included that spent on review of records (prior notes available to me/labs/imaging if pertinent), discussing treatment and goals, answering patient's questions and coordinating care.  Cc:  Duncan, Graham S, MD  

## 2019-03-19 ENCOUNTER — Other Ambulatory Visit: Payer: Self-pay | Admitting: Neurology

## 2019-03-20 ENCOUNTER — Encounter: Payer: Self-pay | Admitting: Neurology

## 2019-03-20 ENCOUNTER — Other Ambulatory Visit: Payer: Self-pay

## 2019-03-20 ENCOUNTER — Ambulatory Visit (INDEPENDENT_AMBULATORY_CARE_PROVIDER_SITE_OTHER): Payer: Medicare Other | Admitting: Neurology

## 2019-03-20 DIAGNOSIS — G20 Parkinson's disease: Secondary | ICD-10-CM

## 2019-03-20 MED ORDER — CARBIDOPA-LEVODOPA ER 50-200 MG PO TBCR: 1.0000 | EXTENDED_RELEASE_TABLET | Freq: Two times a day (BID) | ORAL | 1 refills | Status: DC

## 2019-03-20 MED ORDER — CARBIDOPA-LEVODOPA ER 50-200 MG PO TBCR: 1.0000 | EXTENDED_RELEASE_TABLET | Freq: Every day | ORAL | 1 refills | Status: DC

## 2019-03-20 MED ORDER — CARBIDOPA-LEVODOPA ER 25-100 MG PO TBCR: EXTENDED_RELEASE_TABLET | ORAL | 1 refills | Status: DC

## 2019-03-20 NOTE — Patient Instructions (Addendum)
1.  Take carbidopa/levodopa 25/100, 2 tablets at 7 AM/2 tablets at 11 AM/2 tablets at 3 PM/1 tablet at 7 PM 2. STOP carbidopa/levodopa 25/100 CR at bedtime 3.  START carbidopa/levodopa 50/200 CR at bedtime 4.  Exercise with your bike!

## 2019-03-20 NOTE — Procedures (Signed)
DBS Programming was performed.    Manufacturer of DBS device: [employer]  Total time spent programming was 8 minutes.  Device was confirmed to be on.  Soft start was confirmed to be on.  Impedences were checked and were within normal limits.  Battery was checked and was determined to be functioning normally and not near the end of life.  Final settings were as follows:   Active Contacts Amplitude (V) PW (ms) Frequency (hz) Battery  Left Brain       03/20/19 1-2+ 4.4 90 170 2.81                       Right Brain       03/20/19 4-7+ 4.5 90 170

## 2019-04-11 ENCOUNTER — Other Ambulatory Visit: Payer: Self-pay | Admitting: Family Medicine

## 2019-04-14 NOTE — Telephone Encounter (Signed)
Electronic refill request. Tramadol Last office visit:   01/02/2019 Acute - E[facility] Last Filled:    60 tablet 2 01/08/2019  Please advise.

## 2019-04-15 NOTE — Telephone Encounter (Signed)
Sent. Thanks.   

## 2019-04-21 DIAGNOSIS — N183 Chronic kidney disease, stage 3 unspecified: Secondary | ICD-10-CM | POA: Diagnosis not present

## 2019-04-23 ENCOUNTER — Ambulatory Visit (INDEPENDENT_AMBULATORY_CARE_PROVIDER_SITE_OTHER): Payer: Medicare Other | Admitting: *Deleted

## 2019-04-23 DIAGNOSIS — I5022 Chronic systolic (congestive) heart failure: Secondary | ICD-10-CM

## 2019-04-24 ENCOUNTER — Other Ambulatory Visit (INDEPENDENT_AMBULATORY_CARE_PROVIDER_SITE_OTHER): Payer: Medicare Other

## 2019-04-24 ENCOUNTER — Other Ambulatory Visit: Payer: Self-pay

## 2019-04-24 DIAGNOSIS — E1165 Type 2 diabetes mellitus with hyperglycemia: Secondary | ICD-10-CM

## 2019-04-24 DIAGNOSIS — M109 Gout, unspecified: Secondary | ICD-10-CM

## 2019-04-24 DIAGNOSIS — E782 Mixed hyperlipidemia: Secondary | ICD-10-CM | POA: Diagnosis not present

## 2019-04-24 DIAGNOSIS — Z794 Long term (current) use of insulin: Secondary | ICD-10-CM

## 2019-04-24 DIAGNOSIS — E890 Postprocedural hypothyroidism: Secondary | ICD-10-CM | POA: Diagnosis not present

## 2019-04-24 LAB — LIPID PANEL
Cholesterol: 130 mg/dL (ref 0–200)
HDL: 23.2 mg/dL — ABNORMAL LOW (ref >39.00)
NonHDL: 107.07
Total CHOL/HDL Ratio: 6
Triglycerides: 297 mg/dL — ABNORMAL HIGH (ref 0.0–149.0)
VLDL: 59.4 mg/dL — ABNORMAL HIGH (ref 0.0–40.0)

## 2019-04-24 LAB — BASIC METABOLIC PANEL
BUN: 59 mg/dL — ABNORMAL HIGH (ref 6–23)
CO2: 33 mEq/L — ABNORMAL HIGH (ref 19–32)
Calcium: 9.2 mg/dL (ref 8.4–10.5)
Chloride: 90 mEq/L — ABNORMAL LOW (ref 96–112)
Creatinine, Ser: 2.27 mg/dL — ABNORMAL HIGH (ref 0.40–1.50)
GFR: 28.88 mL/min — ABNORMAL LOW (ref >60.00)
Glucose, Bld: 238 mg/dL — ABNORMAL HIGH (ref 70–99)
Potassium: 3.2 mEq/L — ABNORMAL LOW (ref 3.5–5.1)
Sodium: 135 mEq/L (ref 135–145)

## 2019-04-24 LAB — HEMOGLOBIN A1C: Hgb A1c MFr Bld: 8.6 % — ABNORMAL HIGH (ref 4.6–6.5)

## 2019-04-24 LAB — T4, FREE: Free T4: 0.77 ng/dL (ref 0.60–1.60)

## 2019-04-24 LAB — TSH: TSH: 5.4 u[IU]/mL — ABNORMAL HIGH (ref 0.35–4.50)

## 2019-04-24 LAB — LDL CHOLESTEROL, DIRECT: Direct LDL: 51 mg/dL

## 2019-04-25 ENCOUNTER — Encounter: Payer: Self-pay | Admitting: Family Medicine

## 2019-04-25 ENCOUNTER — Telehealth: Payer: Self-pay | Admitting: Endocrinology

## 2019-04-25 LAB — CUP PACEART REMOTE DEVICE CHECK
Battery Remaining Longevity: 55 mo
Battery Voltage: 2.97 V
Brady Statistic AP VP Percent: 0.01 %
Brady Statistic AP VS Percent: 2.08 %
Brady Statistic AS VP Percent: 0.06 %
Brady Statistic AS VS Percent: 97.86 %
Brady Statistic RA Percent Paced: 2.07 %
Brady Statistic RV Percent Paced: 0.07 %
Date Time Interrogation Session: 20210414042506
HighPow Impedance: 74 Ohm
Implantable Lead Implant Date: 20151105
Implantable Lead Implant Date: 20151105
Implantable Lead Location: 753859
Implantable Lead Location: 753860
Implantable Lead Model: 5076
Implantable Pulse Generator Implant Date: 20151105
Lead Channel Impedance Value: 437 Ohm
Lead Channel Impedance Value: 437 Ohm
Lead Channel Impedance Value: 513 Ohm
Lead Channel Pacing Threshold Amplitude: 0.5 V
Lead Channel Pacing Threshold Amplitude: 1 V
Lead Channel Pacing Threshold Pulse Width: 0.4 ms
Lead Channel Pacing Threshold Pulse Width: 0.4 ms
Lead Channel Sensing Intrinsic Amplitude: 2.25 mV
Lead Channel Sensing Intrinsic Amplitude: 2.25 mV
Lead Channel Sensing Intrinsic Amplitude: 20.75 mV
Lead Channel Sensing Intrinsic Amplitude: 20.75 mV
Lead Channel Setting Pacing Amplitude: 2 V
Lead Channel Setting Pacing Amplitude: 2.5 V
Lead Channel Setting Pacing Pulse Width: 0.4 ms
Lead Channel Setting Sensing Sensitivity: 0.3 mV

## 2019-04-25 NOTE — Telephone Encounter (Signed)
Will be signed and refaxed.

## 2019-04-25 NOTE — Progress Notes (Signed)
ICD Remote  

## 2019-04-25 NOTE — Telephone Encounter (Signed)
Tanya from [place] pod access team called stating the PA she got for this patient did not have a signature and requests it to be resent. I did let her know to please fax the form that needed to be signed please. Her ph# [phone]

## 2019-04-29 ENCOUNTER — Other Ambulatory Visit: Payer: Self-pay

## 2019-04-30 ENCOUNTER — Ambulatory Visit (INDEPENDENT_AMBULATORY_CARE_PROVIDER_SITE_OTHER): Payer: Medicare Other | Admitting: Endocrinology

## 2019-04-30 ENCOUNTER — Encounter: Payer: Self-pay | Admitting: Endocrinology

## 2019-04-30 VITALS — BP 100/50 | HR 76 | Ht 69.0 in | Wt 251.4 lb

## 2019-04-30 DIAGNOSIS — E890 Postprocedural hypothyroidism: Secondary | ICD-10-CM | POA: Diagnosis not present

## 2019-04-30 DIAGNOSIS — Z794 Long term (current) use of insulin: Secondary | ICD-10-CM | POA: Diagnosis not present

## 2019-04-30 DIAGNOSIS — E1165 Type 2 diabetes mellitus with hyperglycemia: Secondary | ICD-10-CM

## 2019-04-30 MED ORDER — OZEMPIC (1 MG/DOSE) 2 MG/1.5ML ~~LOC~~ SOPN: 1.0000 mg | PEN_INJECTOR | SUBCUTANEOUS | 1 refills | Status: DC

## 2019-04-30 MED ORDER — LEVOTHYROXINE SODIUM 137 MCG PO TABS: 137.0000 ug | ORAL_TABLET | Freq: Every day | ORAL | 1 refills | Status: DC

## 2019-04-30 NOTE — Progress Notes (Signed)
Patient ID: Henry [name], male   DOB: March 21, 1950, 69 y.o.   MRN: [id number]           Reason for Appointment: Follow-up for Type 2 Diabetes   History of Present Illness:          Diagnosis: Type 2 diabetes mellitus, date of diagnosis: 2000      Past history:   Patient thinks he has been taking Amaryl for several years and probably metformin since onset also At some point he was changed from metformin to [facility] and was taking this since at least 2012 A1c had been higher in 2015 Since his A1c had been progressively higher with his regimen of Janumet and Amaryl he was started on Victoza in 01/2014 He was started on Lantus insulin in 5/16 because of persistent hyperglycemia especially fasting; was having readings as high as 293 Because of tendency to high postprandial readings and for control with Lantus he was switched to the V-go pump in  09/2014  Recent history:    INSULIN regimen: Omnipod insulin pump with Humalog insulin  Basal rate programs: Midnight = 1.7, 7 AM = 1.95, 5 PM = 2.15, total basal 44 units  Boluses 4.5 units for breakfast, 4-6 lunch and 6-9 units for dinner  Non-insulin hypoglycemic drugs the patient is taking are: Ozempic 0.5 mg weekly  His A1c has been consistently over 8% and now 8.6   Current management, blood sugar patterns and problems identified:  He has been advised to check his blood sugars regularly including after meals but is mostly again checking them in the morning  Although he thinks he is not eating snacks late at night he still has a few readings over 200 in the morning  Difficult to judge his postprandial readings as he mostly checks sugars before eating  Blood sugars at dinnertime recently are all over 200  He is usually not adjusting his boluses based on how much he is eating and generally taking fixed doses   He is not entering his blood sugars at the time of boluses usually in the afternoons and evenings to enable a correction  dose  He appears to have lost a little weight  Had no difficulty doing the boluses and usually bolusing at the time of the meal  Hypoglycemia:   none  He has seen the dietitian in 04/2017  Not able to exercise because of back pain and difficulty walking       Side effects from medications have been: None Compliance with the medical regimen: Fair   Glucose monitoring:  Recently about 2 times  a day         Glucometer:  Freestyle       Blood Glucose readings by download of pump    PRE-MEAL Fasting Lunch Dinner Bedtime Overall  Glucose range:  143-214  188, 195  201-279    Mean/median:  182   240   201   POST-MEAL PC Breakfast PC Lunch PC Dinner  Glucose range:   306   Mean/median:      PREVIOUS readings:   PRE-MEAL Fasting Lunch Dinner Bedtime Overall  Glucose range:  122-244  200  201-304    Mean/median:  195   250  215   POST-MEAL PC Breakfast PC Lunch PC Dinner  Glucose range:    246  Mean/median:        Meals: 9 am , 2-3 pm and 6-7 pm  Dietician visit, most recent: 4/19               Weight history:   Wt Readings from Last 3 Encounters:  04/30/19 251 lb 6.4 oz (114 kg)  03/20/19 256 lb (116.1 kg)  02/05/19 255 lb 12.8 oz (116 kg)    Glycemic control:   Lab Results  Component Value Date   HGBA1C 8.6 (H) 04/24/2019   HGBA1C 8.2 (A) 02/05/2019   HGBA1C 8.3 (H) 08/16/2018   Lab Results  Component Value Date   MICROALBUR <0.7 06/14/2018   LDLCALC 118 (H) 09/16/2015   CREATININE 2.27 (H) 04/24/2019    Lab Results  Component Value Date   FRUCTOSAMINE 277 11/09/2017   FRUCTOSAMINE 350 (H) 03/29/2017   FRUCTOSAMINE 250 09/16/2015     Other active problems: See review of systems      Allergies as of 04/30/2019      Reactions   Penicillins Anaphylaxis, Rash   Because of a history of documented adverse serious drug reaction;Medi Alert bracelet  is recommended PATIENT HAS HAD A PCN REACTION WITH IMMEDIATE RASH, FACIAL/TONGUE/THROAT  SWELLING, SOB, OR LIGHTHEADEDNESS WITH HYPOTENSION:  #  #  YES  #  #  Has patient had a PCN reaction causing severe rash involving mucus membranes or skin necrosis: unknown Has patient had a PCN reaction that required hospitalization NO Has patient had a PCN reaction occurring within the last 10 years: NO   Klonopin [clonazepam] Other (See Comments)   agitation   Peanut-containing Drug Products Cough   Watermelon [citrullus Vulgaris] Other (See Comments), Cough   Tickle in throat      Medication List       Accurate as of April 30, 2019  1:14 PM. If you have any questions, ask your nurse or doctor.        acetaminophen 500 MG tablet Commonly known as: TYLENOL Take 500 mg by mouth 2 (two) times daily as needed for mild pain or headache.   albuterol 108 (90 Base) MCG/ACT inhaler Commonly known as: VENTOLIN HFA Inhale 2 puffs into the lungs every 6 (six) hours as needed for wheezing or shortness of breath.   allopurinol 100 MG tablet Commonly known as: ZYLOPRIM Take 1.5 tablets (150 mg total) by mouth daily.   AMBULATORY NON FORMULARY MEDICATION Ustep walker DX: G20   aspirin EC 81 MG tablet Take 81 mg by mouth daily.   Carbidopa-Levodopa ER 25-100 MG tablet controlled release Commonly known as: SINEMET CR TAKE 2 TABLETS AT 7AM,THEN 2TABLETS AT 11AM,THEN 2 TABS AT 3PM, THEN 1 AT 7PM   carbidopa-levodopa 50-200 MG tablet Commonly known as: SINEMET CR Take 1 tablet by mouth at bedtime.   colchicine 0.6 MG tablet Take 1 tablet (0.6 mg total) by mouth 2 (two) times daily as needed (gout).   ferrous sulfate 325 (65 FE) MG EC tablet Take 325 mg by mouth daily.   Fluticasone-Salmeterol 250-50 MCG/DOSE Aepb Commonly known as: Wixela Inhub INHALE 1 PUFF INTO THE LUNGS 2 TIMES DAILY.   furosemide 80 MG tablet Commonly known as: LASIX TAKE 1 & 1/2 TABLETS BY MOUTH DAILY What changed:   how much to take  when to take this   gabapentin 100 MG capsule Commonly known as:  NEURONTIN TAKE 2 CAPSULES BY MOUTH EVERY MORNING AND TAKE 4 CAPSULES AT BEDTIME   glucose blood test strip Commonly known as: OneTouch Verio USE AS DIRECTED TO CHECK BLOOD SUGAR TWICE A DAY. DX:E11.65   FREESTYLE LITE test  strip Generic drug: glucose blood USE TO CHECK BLOOD SUGAR 4 TIMES DAILY.   HumaLOG 100 UNIT/ML injection Generic drug: insulin lispro USE MAXIMUM 76 UNITS PER   DAY WITH V-GO PUMP   Klor-Con M20 20 MEQ tablet Generic drug: potassium chloride SA TAKE 2 TABLETS BY MOUTH DAILY   levothyroxine 125 MCG tablet Commonly known as: SYNTHROID TAKE 1 TABLET BY MOUTH EVERY DAY BEFORE BREAKFAST   lisinopril 2.5 MG tablet Commonly known as: ZESTRIL TAKE 1 TABLET BY MOUTH EVERY DAY   metolazone 2.5 MG tablet Commonly known as: ZAROXOLYN TAKE 1 TABLET BY MOUTH ONCE A WEEK. What changed:   how much to take  how to take this  when to take this  additional instructions   metoprolol succinate 50 MG 24 hr tablet Commonly known as: TOPROL-XL TAKE 1 TABLET BY MOUTH DAILY. TAKE WITH OR IMMEDIATELY FOLLOWING A MEAL. What changed: See the new instructions.   omega-3 acid ethyl esters 1 g capsule Commonly known as: LOVAZA TAKE 2 CAPSULES (2GRAMS TOTAL) BY MOUTH TWICE DAILY.   OmniPod Dash System Kit 1 each by Does not apply route. Use Dash system for Continuous Blood Glucose Monitoring.   OVER THE COUNTER MEDICATION Apply 1 application topically at bedtime. Theraworx Pain Cream   Ozempic (0.25 or 0.5 MG/DOSE) 2 MG/1.5ML Sopn Generic drug: Semaglutide(0.25 or 0.'5MG'$ /DOS) Inject 0.5 mg into the skin once a week. Inject 0.'25mg'$  under the skin once weekly for 2 weeks and then increase to 0.'5mg'$  weekly.   polyethylene glycol 17 g packet Commonly known as: MIRALAX / GLYCOLAX Take 17 g by mouth daily.   rosuvastatin 20 MG tablet Commonly known as: CRESTOR TAKE 1 TABLET BY MOUTH EVERY DAY   traMADol 50 MG tablet Commonly known as: ULTRAM TAKE 1 TABLET BY MOUTH 2  TIMES DAILY AS NEEDED FOR MODERATE PAIN   travoprost (benzalkonium) 0.004 % ophthalmic solution Commonly known as: TRAVATAN Place 1 drop into the right eye at bedtime.   vitamin B-12 1000 MCG tablet Commonly known as: CYANOCOBALAMIN Take 1,000 mcg by mouth daily.   Vitamin D 50 MCG (2000 UT) tablet Take 2,000 Units by mouth daily.       Allergies:  Allergies  Allergen Reactions  . Penicillins Anaphylaxis and Rash    Because of a history of documented adverse serious drug reaction;Medi Alert bracelet  is recommended PATIENT HAS HAD A PCN REACTION WITH IMMEDIATE RASH, FACIAL/TONGUE/THROAT SWELLING, SOB, OR LIGHTHEADEDNESS WITH HYPOTENSION:  #  #  YES  #  #  Has patient had a PCN reaction causing severe rash involving mucus membranes or skin necrosis: unknown Has patient had a PCN reaction that required hospitalization NO Has patient had a PCN reaction occurring within the last 10 years: NO  . Klonopin [Clonazepam] Other (See Comments)    agitation  . Peanut-Containing Drug Products Cough  . Watermelon [Citrullus Vulgaris] Other (See Comments) and Cough    Tickle in throat    Past Medical History:  Diagnosis Date  . AICD (automatic cardioverter/defibrillator) present    Dr Lovena Le office visit yearly, MDT   . Arthritis    cane  . Asthma   . Benign neoplasm of colon   . CAD (coronary artery disease)   . Cardiomyopathy   . Cataract   . Complication of anesthesia    pt states that he got a rash  . Constipation   . 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 DM (diabetes mellitus) (  [place])    TYPE 2   . GERD (gastroesophageal reflux disease)   . Glaucoma    right eye  . HLD (hyperlipidemia)   . HTN (hypertension)    pt denies 08/19/12  . Hyperplasia, prostate   . Hyperthyroidism    thyroid lobectomy  . MI (myocardial infarction) [facility])    Dr Stanford Breed 2000, x3vessels bypass  . Nephrolithiasis   . OSA (obstructive sleep apnea)    AHI-28,on CPAP,  noncompliant with CPAP  . Parkinson disease ([place])    1999  . PONV (postoperative nausea and vomiting)   . Restless legs   . Shortness of breath    Hx: of at all times  . Sleep apnea    does not use CPAP  . Sleep apnea, organic   . UTI (lower urinary tract infection) 09/15/12   Klebsiella  . Ventral hernia     Past Surgical History:  Procedure Laterality Date  . 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 BIOPSY  10/14/2018   Procedure: BIOPSY;  Surgeon: Jerene Bears, MD;  Location: [facility];  Service: Gastroenterology;;  . CARDIAC CATHETERIZATION    . CATARACT EXTRACTION W/ INTRAOCULAR LENS IMPLANT     Hx: of right eye  . CATARACT EXTRACTION W/ INTRAOCULAR LENS IMPLANT Left 2018  . COLONOSCOPY N/A 10/13/2014   Procedure: COLONOSCOPY;  Surgeon: Jerene Bears, MD;  Location: [facility] END[facility];  Service: Gastroenterology;  Laterality: N/A;  . COLONOSCOPY W/ BIOPSIES AND POLYPECTOMY     Hx: of  . COLONOSCOPY WITH PROPOFOL N/A 10/14/2018   Procedure: COLONOSCOPY WITH PROPOFOL;  Surgeon: Jerene Bears, MD;  Location: [facility] ENDOSCOPY;  Service: Gastroenterology;  Laterality: N/A;  . CORONARY ARTERY BYPASS GRAFT  2000   [name], MD  . [place]  . DEEP BRAIN STIMULATOR PLACEMENT  2004   Right and left VIN stimulator placement (parkinsons)  . EYE SURGERY    . FINGER AMPUTATION     left pointer  . IMPLANTABLE CARDIOVERTER DEFIBRILLATOR IMPLANT N/A 11/13/2013   Procedure: IMPLANTABLE CARDIOVERTER DEFIBRILLATOR IMPLANT;  Surgeon: Evans Lance, MD;  Location[facility] C[facility] LAB;  Service: Cardiovascular;  Laterality: N/A;  . INSERT / REPLACE / REMOVE PACEMAKER    . LEFT AND RIGHT HEART CATHETERIZATION WITH CORONARY ANGIOGRAM N/A 09/24/2013   Procedure: LEFT AND RIGHT HEART CATHETERIZATION WITH CORONARY ANGIOGRAM;  Surgeon: Burnell Blanks, MD;  Location[facility] C[facility] LAB;  Service: Cardiovascular;  Laterality: N/A;  . LITHOTRIPSY     3 different times  .  MEDIAN STERNOTOMY  2000  . POLYPECTOMY  10/14/2018   Procedure: POLYPECTOMY;  Surgeon: Jerene Bears, MD;  Location: [facility];  Service: Gastroenterology;;  . PULSE GENERATOR IMPLANT Right 11/13/2017   Procedure: Right chest implantable pulse generator change;  Surgeon: Erline Levine, MD;  Location: [facility];  Service: Neurosurgery;  Laterality: Right;  Right chest implantable pulse generator change  . SUBTHALAMIC STIMULATOR BATTERY REPLACEMENT N/A 09/05/2012   Procedure: Deep brain stimulator battery change;  Surgeon: Erline Levine, MD;  Location: [facility]O ORS;  Service: Neurosurgery;  Laterality: N/A;  Deep brain stimulator battery change  . SUBTHALAMIC STIMULATOR BATTERY REPLACEMENT N/A 06/10/2015   Procedure: Deep Brain stimulator battery change;  Surgeon: Erline Levine, MD;  Location: [place] [facility]UR[facility]S;  Service: Neurosurgery;  Laterality: N/A;  . TONSILLECTOMY      Family History  Problem Relation Age of Onset  . Aneurysm Mother   . Alcoholism  Father   . HIV/AIDS Brother 75       AIDS  . Healthy Sister   . Healthy Child   . Peripheral vascular disease Other   . Arthritis Other   . Healthy Sister   . Healthy Child   . Healthy Child   . Diabetes Neg Hx   . Heart disease Neg Hx   . Colon cancer Neg Hx   . Prostate cancer Neg Hx     Social History:  reports that he has never smoked. He has never used smokeless tobacco. He reports previous alcohol use. He reports that he does not use drugs.    Review of Systems      HYPERLIPIDEMIA:  He is on Crestor 20 mg with good control of LDL, has low HDL Triglycerides are persistently over 200, treated with Lovaza 2 g twice a day       Lab Results  Component Value Date   CHOL 130 04/24/2019   CHOL 116 09/18/2018   CHOL 130 03/12/2018   Lab Results  Component Value Date   HDL 23.20 (L) 04/24/2019   HDL 25.80 (L) 09/18/2018   HDL 29.00 (L) 03/12/2018   Lab Results  Component Value Date   LDLCALC 118 (H) 09/16/2015   LDLCALC 126 (H)  04/08/2013   LDLCALC 51 02/28/2012   Lab Results  Component Value Date   TRIG 297.0 (H) 04/24/2019   TRIG 270.0 (H) 09/18/2018   TRIG 257.0 (H) 03/12/2018   Lab Results  Component Value Date   CHOLHDL 6 04/24/2019   CHOLHDL 4 09/18/2018   CHOLHDL 4 03/12/2018   Lab Results  Component Value Date   LDLDIRECT 51.0 04/24/2019   LDLDIRECT 54.0 09/18/2018   LDLDIRECT 67.0 03/12/2018                  Thyroid:   He has had hypothyroidism following radioactive iodine treatment for hyperthyroidism in 2013  He has not missed any doses and is usually taking the levothyroxine consistently before breakfast TSH is relatively higher   Lab Results  Component Value Date   TSH 5.40 (H) 04/24/2019   TSH 2.64 08/16/2018   TSH 3.51 03/12/2018   FREET4 0.77 04/24/2019   FREET4 0.88 08/16/2018   FREET4 0.75 03/12/2018       HYPERTENSION has been followed by  other physicians and has chronically low blood pressure  BP Readings from Last 3 Encounters:  04/30/19 (!) 100/50  03/20/19 102/64  02/05/19 (!) 118/54     CKD: Has variable creatinine levels as follows   Lab Results  Component Value Date   CREATININE 2.27 (H) 04/24/2019   CREATININE 1.94 (H) 08/16/2018   CREATININE 1.68 (H) 07/01/2018    Neuropathy: Taking gabapentin 100 mg from his PCP. He thinks he has has some pain in his feet but also apparently does get relief with some steroid injection in his feet from the podiatrist at times Also has had some history of numbness in his toes  Diabetic foot exam in 08/2018  Last eye exam 11/20, negative  Physical Examination:  BP (!) 100/50 (BP Location: Left Arm, Patient Position: Sitting, Cuff Size: Large)   Pulse 76   Ht '5\' 9"'$  (1.753 m)   Wt 251 lb 6.4 oz (114 kg)   SpO2 97%   BMI 37.13 kg/m      ASSESSMENT/PLAN  Diabetes type 2, insulin-dependent with obesity     See history of present illness for detailed discussion of his  current management, blood sugar  patterns and problems identified  His A1c is persistently over 8% and now 8.6   He is using the Omni pod insulin pump with the older PDM and also Ozempic 0.5   He is having fairly consistently high blood sugars at lunch and dinnertime and this is likely to be from what inadequate basal and bolus insulins  His basal insulin increased to 2.1 from 7 AM until 11 AM and from 11 AM he will have a basal rate of 2.3  Basal settings were programmed in the office since he and his wife do not know how to do this  He will increase his carbohydrate input by 10 g for each meal to provide more boluses  Discussed needing to check blood sugars after dinner consistently  Low fat meals and snacks  Increase Ozempic to 1 mg Follow-up in 2 months  Posttreatment hypothyroidism: He has nonspecific fatigue TSH is mildly increased and he has been regular with his supplement lately He can go up to 137 mcg of levothyroxine instead of 125 Recheck TSH on the next visit  Lipids: Continues to have high fasting triglycerides, needs better diabetes control, weight loss and consistently low carbohydrate and low-fat diet     Follow-up in 3 months    There are no Patient Instructions on file for this visit.        Elayne Snare 04/30/2019, 1:14 PM   Note: This office note was prepared with Dragon voice recognition system technology. Any transcriptional errors that result from this process are unintentional.

## 2019-04-30 NOTE — Patient Instructions (Signed)
Ozempic 2 of 0.5mg  weekly then 1mg  weekly

## 2019-05-01 DIAGNOSIS — G20 Parkinson's disease: Secondary | ICD-10-CM | POA: Diagnosis not present

## 2019-05-01 DIAGNOSIS — R350 Frequency of micturition: Secondary | ICD-10-CM | POA: Diagnosis not present

## 2019-05-01 DIAGNOSIS — I129 Hypertensive chronic kidney disease with stage 1 through stage 4 chronic kidney disease, or unspecified chronic kidney disease: Secondary | ICD-10-CM | POA: Diagnosis not present

## 2019-05-01 DIAGNOSIS — I509 Heart failure, unspecified: Secondary | ICD-10-CM | POA: Diagnosis not present

## 2019-05-01 DIAGNOSIS — N2581 Secondary hyperparathyroidism of renal origin: Secondary | ICD-10-CM | POA: Diagnosis not present

## 2019-05-01 DIAGNOSIS — N1832 Chronic kidney disease, stage 3b: Secondary | ICD-10-CM | POA: Diagnosis not present

## 2019-05-01 DIAGNOSIS — D631 Anemia in chronic kidney disease: Secondary | ICD-10-CM | POA: Diagnosis not present

## 2019-05-02 ENCOUNTER — Encounter: Payer: Self-pay | Admitting: Internal Medicine

## 2019-05-02 ENCOUNTER — Other Ambulatory Visit: Payer: Self-pay

## 2019-05-02 ENCOUNTER — Ambulatory Visit (INDEPENDENT_AMBULATORY_CARE_PROVIDER_SITE_OTHER): Payer: Medicare Other | Admitting: Internal Medicine

## 2019-05-02 VITALS — BP 92/62 | HR 76 | Ht 69.0 in | Wt 253.0 lb

## 2019-05-02 DIAGNOSIS — I10 Essential (primary) hypertension: Secondary | ICD-10-CM

## 2019-05-02 DIAGNOSIS — Z95810 Presence of automatic (implantable) cardiac defibrillator: Secondary | ICD-10-CM | POA: Diagnosis not present

## 2019-05-02 DIAGNOSIS — I5022 Chronic systolic (congestive) heart failure: Secondary | ICD-10-CM

## 2019-05-02 DIAGNOSIS — I2510 Atherosclerotic heart disease of native coronary artery without angina pectoris: Secondary | ICD-10-CM | POA: Diagnosis not present

## 2019-05-02 LAB — CUP PACEART INCLINIC DEVICE CHECK
Battery Remaining Longevity: 54 mo
Battery Voltage: 2.97 V
Brady Statistic AP VP Percent: 0.01 %
Brady Statistic AP VS Percent: 1.1 %
Brady Statistic AS VP Percent: 0.05 %
Brady Statistic AS VS Percent: 98.85 %
Brady Statistic RA Percent Paced: 1.1 %
Brady Statistic RV Percent Paced: 0.05 %
Date Time Interrogation Session: 20210423152400
HighPow Impedance: 75 Ohm
Implantable Lead Implant Date: 20151105
Implantable Lead Implant Date: 20151105
Implantable Lead Location: 753859
Implantable Lead Location: 753860
Implantable Lead Model: 5076
Implantable Pulse Generator Implant Date: 20151105
Lead Channel Impedance Value: 456 Ohm
Lead Channel Impedance Value: 494 Ohm
Lead Channel Impedance Value: 532 Ohm
Lead Channel Pacing Threshold Amplitude: 0.5 V
Lead Channel Pacing Threshold Amplitude: 1 V
Lead Channel Pacing Threshold Pulse Width: 0.4 ms
Lead Channel Pacing Threshold Pulse Width: 0.4 ms
Lead Channel Sensing Intrinsic Amplitude: 17.5 mV
Lead Channel Sensing Intrinsic Amplitude: 2.6 mV
Lead Channel Setting Pacing Amplitude: 2.25 V
Lead Channel Setting Pacing Amplitude: 2.5 V
Lead Channel Setting Pacing Pulse Width: 0.4 ms
Lead Channel Setting Sensing Sensitivity: 0.3 mV

## 2019-05-02 NOTE — Patient Instructions (Signed)
Medication Instructions:  Your physician recommends that you continue on your current medications as directed. Please refer to the Current Medication list given to you today.  Labwork: None ordered.  Testing/Procedures: None ordered.  Follow-Up: Your physician wants you to follow-up in: one year with Dr. Lovena Le.   You will receive a reminder letter in the mail two months in advance. If you don't receive a letter, please call our office to schedule the follow-up appointment.  Remote monitoring is used to monitor your ICD from home. This monitoring reduces the number of office visits required to check your device to one time per year. It allows [place] to keep an eye on the functioning of your device to ensure it is working properly. You are scheduled for a device check from home on 07/23/2019. You may send your transmission at any time that day. If you have a wireless device, the transmission will be sent automatically. After your physician reviews your transmission, you will receive a postcard with your next transmission date.  Any Other Special Instructions Will Be Listed Below (If Applicable).  If you need a refill on your cardiac medications before your next appointment, please call your pharmacy.

## 2019-05-02 NOTE — Progress Notes (Signed)
HPI Mr. Lowrimore returns today for followup. He is a pleasant 69yo man with a h/o chronic systolic heart failure, ICM, s/p ICD implantation. In the interim,he has had some progression of his Parkinson's. He admits to being sedentary. He denies chest pain and admits to being sedentary.  Allergies  Allergen Reactions  . Penicillins Anaphylaxis and Rash    Because of a history of documented adverse serious drug reaction;Medi Alert bracelet  is recommended PATIENT HAS HAD A PCN REACTION WITH IMMEDIATE RASH, FACIAL/TONGUE/THROAT SWELLING, SOB, OR LIGHTHEADEDNESS WITH HYPOTENSION:  #  #  YES  #  #  Has patient had a PCN reaction causing severe rash involving mucus membranes or skin necrosis: unknown Has patient had a PCN reaction that required hospitalization NO Has patient had a PCN reaction occurring within the last 10 years: NO  . Klonopin [Clonazepam] Other (See Comments)    agitation  . Peanut-Containing Drug Products Cough  . Watermelon [Citrullus Vulgaris] Other (See Comments) and Cough    Tickle in throat     Current Outpatient Medications  Medication Sig Dispense Refill  . acetaminophen (TYLENOL) 500 MG tablet Take 500 mg by mouth 2 (two) times daily as needed for mild pain or headache.     . albuterol (VENTOLIN HFA) 108 (90 Base) MCG/ACT inhaler Inhale 2 puffs into the lungs every 6 (six) hours as needed for wheezing or shortness of breath. 18 g 5  . allopurinol (ZYLOPRIM) 100 MG tablet Take 1.5 tablets (150 mg total) by mouth daily. 135 tablet 3  . AMBULATORY NON FORMULARY MEDICATION Ustep walker DX: G20 1 Device 0  . aspirin EC 81 MG tablet Take 81 mg by mouth daily.    . carbidopa-levodopa (SINEMET CR) 50-200 MG tablet Take 1 tablet by mouth at bedtime. 90 tablet 1  . Carbidopa-Levodopa ER (SINEMET CR) 25-100 MG tablet controlled release TAKE 2 TABLETS AT 7AM,THEN 2TABLETS AT 11AM,THEN 2 TABS AT 3PM, THEN 1 AT 7PM 720 tablet 1  . Cholecalciferol (VITAMIN D) 50 MCG (2000  UT) tablet Take 2,000 Units by mouth daily.     . colchicine 0.6 MG tablet Take 1 tablet (0.6 mg total) by mouth 2 (two) times daily as needed (gout). 30 tablet 1  . ferrous sulfate 325 (65 FE) MG EC tablet Take 325 mg by mouth daily.     . Fluticasone-Salmeterol (WIXELA INHUB) 250-50 MCG/DOSE AEPB INHALE 1 PUFF INTO THE LUNGS 2 TIMES DAILY. 180 each 3  . FREESTYLE LITE test strip USE TO CHECK BLOOD SUGAR 4 TIMES DAILY. 150 strip 3  . furosemide (LASIX) 80 MG tablet TAKE 1 & 1/2 TABLETS BY MOUTH DAILY 135 tablet 1  . gabapentin (NEURONTIN) 100 MG capsule TAKE 2 CAPSULES BY MOUTH EVERY MORNING AND TAKE 4 CAPSULES AT BEDTIME 540 capsule 1  . glucose blood (ONETOUCH VERIO) test strip USE AS DIRECTED TO CHECK BLOOD SUGAR TWICE A DAY. DX:E11.65 100 each 3  . HUMALOG 100 UNIT/ML injection USE MAXIMUM 76 UNITS PER   DAY WITH V-GO PUMP 60 mL 2  . Insulin Disposable Pump (OMNIPOD DASH SYSTEM) KIT 1 each by Does not apply route. Use Dash system for Continuous Blood Glucose Monitoring.    Marland Kitchen KLOR-CON M20 20 MEQ tablet TAKE 2 TABLETS BY MOUTH DAILY 180 tablet 1  . levothyroxine (SYNTHROID) 137 MCG tablet Take 1 tablet (137 mcg total) by mouth daily before breakfast. 90 tablet 1  . metolazone (ZAROXOLYN) 2.5 MG tablet TAKE 1 TABLET  BY MOUTH ONCE A WEEK.    . metoprolol succinate (TOPROL-XL) 50 MG 24 hr tablet TAKE 1 TABLET BY MOUTH DAILY. TAKE WITH OR IMMEDIATELY FOLLOWING A MEAL. 90 tablet 2  . omega-3 acid ethyl esters (LOVAZA) 1 g capsule TAKE 2 CAPSULES (2GRAMS TOTAL) BY MOUTH TWICE DAILY. 360 capsule 1  . OVER THE COUNTER MEDICATION Apply 1 application topically at bedtime. Theraworx Pain Cream    . polyethylene glycol (MIRALAX / GLYCOLAX) packet Take 17 g by mouth daily.     . rosuvastatin (CRESTOR) 20 MG tablet TAKE 1 TABLET BY MOUTH EVERY DAY 90 tablet 3  . Semaglutide, 1 MG/DOSE, (OZEMPIC, 1 MG/DOSE,) 2 MG/1.5ML SOPN Inject 1 mg into the skin once a week. 3 pen 1  . traMADol (ULTRAM) 50 MG tablet TAKE  1 TABLET BY MOUTH 2 TIMES DAILY AS NEEDED FOR MODERATE PAIN 60 tablet 2  . travoprost, benzalkonium, (TRAVATAN) 0.004 % ophthalmic solution Place 1 drop into the right eye at bedtime.     . vitamin B-12 (CYANOCOBALAMIN) 1000 MCG tablet Take 1,000 mcg by mouth daily.     No current facility-administered medications for this visit.     Past Medical History:  Diagnosis Date  . AICD (automatic cardioverter/defibrillator) present    Dr Lovena Le office visit yearly, MDT   . Arthritis    cane  . Asthma   . Benign neoplasm of colon   . CAD (coronary artery disease)   . Cardiomyopathy   . Cataract   . Complication of anesthesia    pt states that he got a rash  . Constipation   . 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 GERD (gastroesophageal reflux disease)   . Glaucoma    right eye  . HLD (hyperlipidemia)   . HTN (hypertension)    pt denies 08/19/12  . Hyperplasia, prostate   . Hyperthyroidism    thyroid lobectomy  . MI (myocardial infarction) [facility])    Dr Stanford Breed 2000, x3vessels bypass  . Nephrolithiasis   . OSA (obstructive sleep apnea)    AHI-28,on CPAP, noncompliant with CPAP  . Parkinson disease ([place])    1999  . PONV (postoperative nausea and vomiting)   . Restless legs   . Shortness of breath    Hx: of at all times  . Sleep apnea    does not use CPAP  . Sleep apnea, organic   . UTI (lower urinary tract infection) 09/15/12   Klebsiella  . Ventral hernia     ROS:   All systems reviewed and negative except as noted in the HPI.   Past Surgical History:  Procedure Laterality Date  . 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 BIOPSY  10/14/2018   Procedure: BIOPSY;  Surgeon: Jerene Bears, MD;  Location: [facility];  Service: Gastroenterology;;  . CARDIAC CATHETERIZATION    . CATARACT EXTRACTION W/ INTRAOCULAR LENS IMPLANT     Hx: of right eye  . CATARACT EXTRACTION W/ INTRAOCULAR LENS  IMPLANT Left 2018  . COLONOSCOPY N/A 10/13/2014   Procedure: COLONOSCOPY;  Surgeon: Jerene Bears, MD;  Location: [facility];  Service: Gastroenterology;  Laterality: N/A;  . COLONOSCOPY W/ BIOPSIES AND POLYPECTOMY     Hx: of  . COLONOSCOPY WITH PROPOFOL N/A 10/14/2018   Procedure: COLONOSCOPY WITH PROPOFOL;  Surgeon: Jerene Bears, MD;  Location: [facility];  Service: Gastroenterology;  Laterality: N/A;  . CORONARY ARTERY BYPASS GRAFT  2000   [name], MD  . [place]  . DEEP BRAIN STIMULATOR PLACEMENT  2004   Right and left VIN stimulator placement (parkinsons)  . EYE SURGERY    . FINGER AMPUTATION     left pointer  . IMPLANTABLE CARDIOVERTER DEFIBRILLATOR IMPLANT N/A 11/13/2013   Procedure: IMPLANTABLE CARDIOVERTER DEFIBRILLATOR IMPLANT;  Surgeon: Evans Lance, MD;  Location: [facility] C[facility] LAB;  Service: Cardiovascular;  Laterality: N/A;  . INSERT / REPLACE / REMOVE PACEMAKER    . LEFT AND RIGHT HEART CATHETERIZATION WITH CORONARY ANGIOGRAM N/A 09/24/2013   Procedure: LEFT AND RIGHT HEART CATHETERIZATION WITH CORONARY ANGIOGRAM;  Surgeon: Burnell Blanks, MD;  Location: [facility] CATH LAB;  Service: Cardiovascular;  Laterality: N/A;  . LITHOTRIPSY     3 different times  . MEDIAN STERNOTOMY  2000  . POLYPECTOMY  10/14/2018   Procedure: POLYPECTOMY;  Surgeon: Jerene Bears, MD;  Location: [facility];  Service: Gastroenterology;;  . PULSE GENERATOR IMPLANT Right 11/13/2017   Procedure: Right chest implantable pulse generator change;  Surgeon: Erline Levine, MD;  Location: [place];  Service: Neurosurgery;  Laterality: Right;  Right chest implantable pulse generator change  . SUBTHALAMIC STIMULATOR BATTERY REPLACEMENT N/A 09/05/2012   Procedure: Deep brain stimulator battery change;  Surgeon: Erline Levine, MD;  Location: [place] [facility];  Service: Neurosurgery;  Laterality: N/A;  Deep brain stimulator battery change  . SUBTHALAMIC STIMULATOR BATTERY REPLACEMENT N/A 06/10/2015     Procedure: Deep Brain stimulator battery change;  Surgeon: Erline Levine, MD;  Location: [place] [facility];  Service: Neurosurgery;  Laterality: N/A;  . TONSILLECTOMY       Family History  Problem Relation Age of Onset  . Aneurysm Mother   . Alcoholism Father   . HIV/AIDS Brother 25       AIDS  . Healthy Sister   . Healthy Child   . Peripheral vascular disease Other   . Arthritis Other   . Healthy Sister   . Healthy Child   . Healthy Child   . Diabetes Neg Hx   . Heart disease Neg Hx   . Colon cancer Neg Hx   . Prostate cancer Neg Hx      Social History   Socioeconomic History  . Marital status: Married    Spouse name: [name]  . Number of children: 2  . Years of education: 27  . Highest education level: High school graduate  Occupational History  . 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 Smoking status: Never Smoker  . Smokeless tobacco: Never Used  Substance and Sexual Activity  . Alcohol use: Not Currently  . Drug use: No  . Sexual activity: Not Currently  Other Topics Concern  . Not on file  Social History Narrative   From [facility]   Retired/disability Clinical research associate   Likes to fish.     Married 1972   3 kids   [place] fan   Social Determinants of Health   Financial Resource Strain: Low Risk   . Difficulty of Paying Living Expenses: Not hard at all  Food Insecurity: No Food Insecurity  . Worried About Charity fundraiser in the Last Year: Never true  . Ran Out of Food in the Last Year: Never true  Transportation Needs: No Transportation Needs  . Lack of Transportation (Medical): No  . Lack of Transportation (  Non-Medical): No  Physical Activity: Insufficiently Active  . Days of Exercise per Week: 3 days  . Minutes of Exercise per Session: 10 min  Stress: No Stress Concern Present  . Feeling of Stress : Not at all  Social Connections:   . Frequency of Communication with Friends and Family:   . Frequency  of Social Gatherings with Friends and Family:   . Attends Religious Services:   . Active Member of Clubs or Organizations:   . Attends Archivist Meetings:   Marland Kitchen Marital Status:   Intimate Partner Violence: Not At Risk  . Fear of Current or Ex-Partner: No  . Emotionally Abused: No  . Physically Abused: No  . Sexually Abused: No     BP 92/62   Pulse 76   Ht '5\' 9"'$  (1.753 m)   Wt 253 lb (114.8 kg)   SpO2 96%   BMI 37.36 kg/m   Physical Exam:  Well appearing NAD HEENT: Unremarkable Neck:  No JVD, no thyromegally Lymphatics:  No adenopathy Back:  No CVA tenderness Lungs:  Clear with no wheezes HEART:  Regular rate rhythm, no murmurs, no rubs, no clicks Abd:  soft, positive bowel sounds, no organomegally, no rebound, no guarding Ext:  2 plus pulses, no edema, no cyanosis, no clubbing Skin:  No rashes no nodules Neuro:  CN II through XII intact, motor grossly intact  EKG - nsr with NSSTT changes  DEVICE  Normal device function.  See PaceArt for details.   Assess/Plan: 1. Chronic systolic heart failure - he has class 2 symptoms but is limited by his parkinsons' 2. ICM - he denies anginal symptoms. No chest pain. 3. ICD - his Medtronic ICD is working normally. 4. Obesity - he is encouraged to lose weight but this is difficult due to his lack of activity.  Mikle Bosworth.D.

## 2019-05-05 ENCOUNTER — Ambulatory Visit: Payer: Medicare Other | Admitting: Endocrinology

## 2019-05-06 ENCOUNTER — Other Ambulatory Visit: Payer: Self-pay | Admitting: Family Medicine

## 2019-05-06 DIAGNOSIS — I509 Heart failure, unspecified: Secondary | ICD-10-CM

## 2019-05-08 NOTE — Progress Notes (Signed)
HPI: FU coronary artery disease status post bypass and graft, Parkinson's, diabetes and hypertension as well as hyperlipidemia. Carotid Dopplers in January 2005 showed 0-39% stenosis. Last nuclear study 6/15 showed EF 41, prior MI; no ischemia. Echo 8/15 showed EF 30-35, moderate LAE. Cardiac catheterization September 2015 showed a pulmonary Wedge pressure of 16 and PA pressure 32/20. The LAD was occluded. No obstructive disease in the circumflex. The right coronary had severe diffuse distal disease. Saphenous vein graft to the PDA was patent. Saphenous vein graft to diagonal patent. LIMA to the mid LAD patent. Medical therapy recommended. Had ICD placed November 2015. Since last seen,there is no dyspnea, chest pain or syncope.  His Parkinson's is worsening.  Current Outpatient Medications  Medication Sig Dispense Refill  . acetaminophen (TYLENOL) 500 MG tablet Take 500 mg by mouth 2 (two) times daily as needed for mild pain or headache.     . albuterol (VENTOLIN HFA) 108 (90 Base) MCG/ACT inhaler Inhale 2 puffs into the lungs every 6 (six) hours as needed for wheezing or shortness of breath. 18 g 5  . allopurinol (ZYLOPRIM) 100 MG tablet Take 1.5 tablets (150 mg total) by mouth daily. 135 tablet 3  . AMBULATORY NON FORMULARY MEDICATION Ustep walker DX: G20 1 Device 0  . aspirin EC 81 MG tablet Take 81 mg by mouth daily.    . carbidopa-levodopa (SINEMET CR) 50-200 MG tablet Take 1 tablet by mouth at bedtime. 90 tablet 1  . Carbidopa-Levodopa ER (SINEMET CR) 25-100 MG tablet controlled release TAKE 2 TABLETS AT 7AM,THEN 2TABLETS AT 11AM,THEN 2 TABS AT 3PM, THEN 1 AT 7PM 720 tablet 1  . Cholecalciferol (VITAMIN D) 50 MCG (2000 UT) tablet Take 2,000 Units by mouth daily.     . colchicine 0.6 MG tablet Take 1 tablet (0.6 mg total) by mouth 2 (two) times daily as needed (gout). 30 tablet 1  . ferrous sulfate 325 (65 FE) MG EC tablet Take 325 mg by mouth daily.     . Fluticasone-Salmeterol  (WIXELA INHUB) 250-50 MCG/DOSE AEPB INHALE 1 PUFF INTO THE LUNGS 2 TIMES DAILY. 180 each 3  . FREESTYLE LITE test strip USE TO CHECK BLOOD SUGAR 4 TIMES DAILY. 150 strip 3  . furosemide (LASIX) 80 MG tablet TAKE 1 & 1/2 TABLETS BY MOUTH DAILY 135 tablet 1  . gabapentin (NEURONTIN) 100 MG capsule TAKE 2 CAPSULES BY MOUTH EVERY MORNING AND TAKE 4 CAPSULES AT BEDTIME 540 capsule 1  . glucose blood (ONETOUCH VERIO) test strip USE AS DIRECTED TO CHECK BLOOD SUGAR TWICE A DAY. DX:E11.65 100 each 3  . HUMALOG 100 UNIT/ML injection USE MAXIMUM 76 UNITS PER   DAY WITH V-GO PUMP 60 mL 2  . Insulin Disposable Pump (OMNIPOD DASH SYSTEM) KIT 1 each by Does not apply route. Use Dash system for Continuous Blood Glucose Monitoring.    Marland Kitchen KLOR-CON M20 20 MEQ tablet TAKE 2 TABLETS BY MOUTH DAILY 180 tablet 1  . levothyroxine (SYNTHROID) 137 MCG tablet Take 1 tablet (137 mcg total) by mouth daily before breakfast. 90 tablet 1  . metolazone (ZAROXOLYN) 2.5 MG tablet TAKE 1 TABLET BY MOUTH ONCE A WEEK.    . metoprolol succinate (TOPROL-XL) 50 MG 24 hr tablet TAKE 1 TABLET BY MOUTH DAILY. TAKE WITH OR IMMEDIATELY FOLLOWING A MEAL. 90 tablet 2  . omega-3 acid ethyl esters (LOVAZA) 1 g capsule TAKE 2 CAPSULES (2GRAMS TOTAL) BY MOUTH TWICE DAILY. 360 capsule 1  . OVER THE  COUNTER MEDICATION Apply 1 application topically at bedtime. Theraworx Pain Cream    . polyethylene glycol (MIRALAX / GLYCOLAX) packet Take 17 g by mouth daily.     . rosuvastatin (CRESTOR) 20 MG tablet TAKE 1 TABLET BY MOUTH EVERY DAY 90 tablet 3  . Semaglutide, 1 MG/DOSE, (OZEMPIC, 1 MG/DOSE,) 2 MG/1.5ML SOPN Inject 1 mg into the skin once a week. 3 pen 1  . traMADol (ULTRAM) 50 MG tablet TAKE 1 TABLET BY MOUTH 2 TIMES DAILY AS NEEDED FOR MODERATE PAIN 60 tablet 2  . travoprost, benzalkonium, (TRAVATAN) 0.004 % ophthalmic solution Place 1 drop into the right eye at bedtime.     . vitamin B-12 (CYANOCOBALAMIN) 1000 MCG tablet Take 1,000 mcg by mouth  daily.     No current facility-administered medications for this visit.     Past Medical History:  Diagnosis Date  . AICD (automatic cardioverter/defibrillator) present    Dr Lovena Le office visit yearly, MDT   . Arthritis    cane  . Asthma   . Benign neoplasm of colon   . CAD (coronary artery disease)   . Cardiomyopathy   . Cataract   . Complication of anesthesia    pt states that he got a rash  . Constipation   . 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 GERD (gastroesophageal reflux disease)   . Glaucoma    right eye  . HLD (hyperlipidemia)   . HTN (hypertension)    pt denies 08/19/12  . Hyperplasia, prostate   . Hyperthyroidism    thyroid lobectomy  . MI (myocardial infarction) [facility])    Dr Stanford Breed 2000, x3vessels bypass  . Nephrolithiasis   . OSA (obstructive sleep apnea)    AHI-28,on CPAP, noncompliant with CPAP  . Parkinson disease ([place])    1999  . PONV (postoperative nausea and vomiting)   . Restless legs   . Shortness of breath    Hx: of at all times  . Sleep apnea    does not use CPAP  . Sleep apnea, organic   . UTI (lower urinary tract infection) 09/15/12   Klebsiella  . Ventral hernia     Past Surgical History:  Procedure Laterality Date  . 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 BIOPSY  10/14/2018   Procedure: BIOPSY;  Surgeon: Jerene Bears, MD;  Location: [facility];  Service: Gastroenterology;;  . CARDIAC CATHETERIZATION    . CATARACT EXTRACTION W/ INTRAOCULAR LENS IMPLANT     Hx: of right eye  . CATARACT EXTRACTION W/ INTRAOCULAR LENS IMPLANT Left 2018  . COLONOSCOPY N/A 10/13/2014   Procedure: COLONOSCOPY;  Surgeon: Jerene Bears, MD;  [facility];  Service: Gastroenterology;  Laterality: N/A;  . COLONOSCOPY W/ BIOPSIES AND POLYPECTOMY     Hx: of  . COLONOSCOPY WITH PROPOFOL N/A 10/14/2018   Procedure: COLONOSCOPY WITH PROPOFOL;  Surgeon: Jerene Bears,  MD;  [facility];  Service: Gastroenterology;  Laterality: N/A;  . CORONARY ARTERY BYPASS GRAFT  2000   [name], MD  . [place]  . DEEP BRAIN STIMULATOR PLACEMENT  2004   Right and left VIN stimulator placement (parkinsons)  . EYE SURGERY    . FINGER AMPUTATION     left pointer  . IMPLANTABLE CARDIOVERTER DEFIBRILLATOR IMPLANT N/A 11/13/2013   Procedure: IMPLANTABLE CARDIOVERTER DEFIBRILLATOR IMPLANT;  Surgeon:  Evans Lance, MD;  Location: [facility] CATH LAB;  Service: Cardiovascular;  Laterality: N/A;  . INSERT / REPLACE / REMOVE PACEMAKER    . LEFT AND RIGHT HEART CATHETERIZATION WITH CORONARY ANGIOGRAM N/A 09/24/2013   Procedure: LEFT AND RIGHT HEART CATHETERIZATION WITH CORONARY ANGIOGRAM;  Surgeon: Burnell Blanks, MD;  [facility] C[facility];  Service: Cardiovascular;  Laterality: N/A;  . LITHOTRIPSY     3 different times  . MEDIAN STERNOTOMY  2000  . POLYPECTOMY  10/14/2018   Procedure: POLYPECTOMY;  Surgeon: Jerene Bears, MD;  Location: [facility];  Service: Gastroenterology;;  . PULSE GENERATOR IMPLANT Right 11/13/2017   Procedure: Right chest implantable pulse generator change;  Surgeon: Erline Levine, MD;  Location: [place];  Service: Neurosurgery;  Laterality: Right;  Right chest implantable pulse generator change  . SUBTHALAMIC STIMULATOR BATTERY REPLACEMENT N/A 09/05/2012   Procedure: Deep brain stimulator battery change;  Surgeon: Erline Levine, MD;  Location: [place] [facility];  Service: Neurosurgery;  Laterality: N/A;  Deep brain stimulator battery change  . SUBTHALAMIC STIMULATOR BATTERY REPLACEMENT N/A 06/10/2015   Procedure: Deep Brain stimulator battery change;  Surgeon: Erline Levine, MD;  Location: [place] [facility];  Service: Neurosurgery;  Laterality: N/A;  . TONSILLECTOMY      Social History   Socioeconomic History  . Marital status: Married    Spouse name: [name]  . Number of children: 2  . Years of education: 27  . Highest education  level: High school graduate  Occupational History  . 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 Smoking status: Never Smoker  . Smokeless tobacco: Never Used  Substance and Sexual Activity  . Alcohol use: Not Currently  . Drug use: No  . Sexual activity: Not Currently  Other Topics Concern  . Not on file  Social History Narrative   From [facility]   Retired/disability Clinical research associate   Likes to fish.     Married 1972   3 kids   [place] fan   Social Determinants of Health   Financial Resource Strain: Low Risk   . Difficulty of Paying Living Expenses: Not hard at all  Food Insecurity: No Food Insecurity  . Worried About Charity fundraiser in the Last Year: Never true  . Ran Out of Food in the Last Year: Never true  Transportation Needs: No Transportation Needs  . Lack of Transportation (Medical): No  . Lack of Transportation (Non-Medical): No  Physical Activity: Insufficiently Active  . Days of Exercise per Week: 3 days  . Minutes of Exercise per Session: 10 min  Stress: No Stress Concern Present  . Feeling of Stress : Not at all  Social Connections:   . Frequency of Communication with Friends and Family:   . Frequency of Social Gatherings with Friends and Family:   . Attends Religious Services:   . Active Member of Clubs or Organizations:   . Attends Archivist Meetings:   Marland Kitchen Marital Status:   Intimate Partner Violence: Not At Risk  . Fear of Current or Ex-Partner: No  . Emotionally Abused: No  . Physically Abused: No  . Sexually Abused: No    Family History  Problem Relation Age of Onset  . Aneurysm Mother   . Alcoholism Father   . HIV/AIDS Brother 27       AIDS  . Healthy Sister   . Healthy Child   . Peripheral vascular disease Other   .  Arthritis Other   . Healthy Sister   . Healthy Child   . Healthy Child   . Diabetes Neg Hx   . Heart disease Neg Hx   . Colon cancer Neg Hx   . Prostate cancer Neg Hx      ROS: no fevers or chills, productive cough, hemoptysis, dysphasia, odynophagia, melena, hematochezia, dysuria, hematuria, rash, seizure activity, orthopnea, PND, pedal edema, claudication. Remaining systems are negative.  Physical Exam: Well-developed well-nourished in no acute distress.  Skin is warm and dry.  HEENT is normal.  Neck is supple.  Chest is clear to auscultation with normal expansion.  Cardiovascular exam is regular rate and rhythm.  Abdominal exam nontender or distended. No masses palpated. Extremities show no edema. neuro flat affect  A/P  1 coronary artery disease-patient denies chest pain.  Continue medical therapy with aspirin and statin.  2 ischemic cardiomyopathy- Lisinopril was recently discontinued due to low blood pressure and he also has renal insufficiency. I will therefore not add hydralazine/nitrates.  Continue beta-blocker.  3 chronic combined systolic/diastolic congestive heart failure-he appears to be euvolemic.  Plan to continue present dose of diuretic.  4 hypertension-blood pressure controlled.  Continue present medications.  5 hyperlipidemia-continue statin.  6 prior ICD-Per electrophysiology.  Kirk Ruths, MD

## 2019-05-12 ENCOUNTER — Encounter: Payer: Self-pay | Admitting: Cardiology

## 2019-05-12 ENCOUNTER — Ambulatory Visit (INDEPENDENT_AMBULATORY_CARE_PROVIDER_SITE_OTHER): Payer: Medicare Other | Admitting: Cardiology

## 2019-05-12 ENCOUNTER — Other Ambulatory Visit: Payer: Self-pay

## 2019-05-12 VITALS — BP 130/76 | HR 76 | Ht 69.0 in | Wt 250.0 lb

## 2019-05-12 DIAGNOSIS — I5022 Chronic systolic (congestive) heart failure: Secondary | ICD-10-CM

## 2019-05-12 DIAGNOSIS — I2510 Atherosclerotic heart disease of native coronary artery without angina pectoris: Secondary | ICD-10-CM

## 2019-05-12 DIAGNOSIS — I255 Ischemic cardiomyopathy: Secondary | ICD-10-CM | POA: Diagnosis not present

## 2019-05-12 DIAGNOSIS — I10 Essential (primary) hypertension: Secondary | ICD-10-CM | POA: Diagnosis not present

## 2019-05-12 NOTE — Patient Instructions (Signed)
Medication Instructions:  NO CHANGE *If you need a refill on your cardiac medications before your next appointment, please call your pharmacy*   Lab Work: If you have labs (blood work) drawn today and your tests are completely normal, you will receive your results only by: Marland Kitchen MyChart Message (if you have MyChart) OR . A paper copy in the mail If you have any lab test that is abnormal or we need to change your treatment, we will call you to review the results.   Testing/Procedures: Your physician has requested that you have an echocardiogram. Echocardiography is a painless test that uses sound waves to create images of your heart. It provides your doctor with information about the size and shape of your heart and how well your heart's chambers and valves are working. This procedure takes approximately one hour. There are no restrictions for this procedure.[place]  Follow-Up: At [facility] ([facility]), you and your health needs are our priority.  As part of our continuing mission to provide you with exceptional heart care, we have created designated Provider Care Teams.  These Care Teams include your primary Cardiologist (physician) and Advanced Practice Providers (APPs -  Physician Assistants and Nurse Practitioners) who all work together to provide you with the care you need, when you need it.  We recommend signing up for the patient portal called "MyChart".  Sign up information is provided on this After Visit Summary.  MyChart is used to connect with patients for Virtual Visits (Telemedicine).  Patients are able to view lab/test results, encounter notes, upcoming appointments, etc.  Non-urgent messages can be sent to your provider as well.   To learn more about what you can do with MyChart, go to [email or web address].    Your next appointment:   6 month(s)  The format for your next appointment:   Either In Person or Virtual  Provider:   You may see Kirk Ruths MD or one of  the following Advanced Practice Providers on your designated Care Team:    Kerin Ransom, PA-C  [place], [place]  Coletta Memos, [place]

## 2019-05-14 ENCOUNTER — Other Ambulatory Visit: Payer: Self-pay | Admitting: Family Medicine

## 2019-05-19 ENCOUNTER — Telehealth: Payer: Self-pay | Admitting: Nutrition

## 2019-05-19 NOTE — Telephone Encounter (Signed)
Thanks

## 2019-05-19 NOTE — Telephone Encounter (Signed)
Wife says they have been eating a lot of strawberry pies, and cake.  She was talked through making changes on husband's PDM per Dr. Ronnie Derby orders::  7AM-11AM: 2.3u/hr, and 11AM until 12AM: 2.6

## 2019-05-22 ENCOUNTER — Encounter: Payer: Self-pay | Admitting: Family Medicine

## 2019-05-22 ENCOUNTER — Ambulatory Visit (INDEPENDENT_AMBULATORY_CARE_PROVIDER_SITE_OTHER): Payer: Medicare Other | Admitting: Family Medicine

## 2019-05-22 ENCOUNTER — Other Ambulatory Visit: Payer: Self-pay | Admitting: Family Medicine

## 2019-05-22 ENCOUNTER — Other Ambulatory Visit: Payer: Self-pay

## 2019-05-22 ENCOUNTER — Telehealth: Payer: Self-pay

## 2019-05-22 DIAGNOSIS — I255 Ischemic cardiomyopathy: Secondary | ICD-10-CM

## 2019-05-22 DIAGNOSIS — L989 Disorder of the skin and subcutaneous tissue, unspecified: Secondary | ICD-10-CM

## 2019-05-22 DIAGNOSIS — R300 Dysuria: Secondary | ICD-10-CM | POA: Diagnosis not present

## 2019-05-22 MED ORDER — CARBIDOPA-LEVODOPA ER 25-100 MG PO TBCR: EXTENDED_RELEASE_TABLET | ORAL | Status: DC

## 2019-05-22 NOTE — Telephone Encounter (Signed)
Pt's wife states that the pt has not, at any time, experienced any sever h/a, nausea, or vomiting. She was informed that if this begins, she should seek emergency medical attention immediately. Pt's wife verbalized understanding.

## 2019-05-22 NOTE — Telephone Encounter (Signed)
He needs to change his pod as it may be malfunctioning unless he has had a lot of sweets or sweet drinks to make it go up.  With the new pod he will need to do a correction bolus after putting in the blood sugar

## 2019-05-22 NOTE — Patient Instructions (Signed)
Keep it clean and covered for now, use the toe spacer to take pressure off and update me as needed.   If your sugars don't get better with the pump adjustment, then please update Dr. Dwyane Dee.   Take care.  Glad to see you.

## 2019-05-22 NOTE — Telephone Encounter (Signed)
Pt's wife contacted office and stated that pt's blood sugar is currently 426, and he has not eaten since 1pm.

## 2019-05-22 NOTE — Progress Notes (Signed)
This visit occurred during the SARS-CoV-2 public health emergency.  Safety protocols were in place, including screening questions prior to the visit, additional usage of staff PPE, and extensive cleaning of exam room while observing appropriate contact time as indicated for disinfecting solutions.  Foot lesion.  R 2nd toe, medial irritation.  Noted 2-3 days ago.  He removed a piece of skin after falling.    Lowest sugar was 231, that was in the last few days.  Had been up to 300s.  He had pump adjustment per endo.  He is going to follow-up with endocrinology.  He is off lisinopril.  His urination is worse off mybetriq, that had prev come through urology.  He was asking about options.  Meds, vitals, and allergies reviewed.   ROS: Per HPI unless specifically indicated in ROS section   nad Flat facial affect noted at baseline. In wheelchair. Right foot exam with lesion noted.  Shallow, no ulceration.  Looks like a shaved/superficially denuded area on the right 2nd toe medially.  No discharge.  No spreading erythema. Intact cap refill and DP pulse.

## 2019-05-23 NOTE — Telephone Encounter (Signed)
Called pt's wife and gave her MD message. Wife stated that they changed the pod last night and the sugars are going down now.

## 2019-05-30 ENCOUNTER — Telehealth: Payer: Self-pay | Admitting: Family Medicine

## 2019-05-30 DIAGNOSIS — L989 Disorder of the skin and subcutaneous tissue, unspecified: Secondary | ICD-10-CM | POA: Insufficient documentation

## 2019-05-30 MED ORDER — MIRABEGRON ER 25 MG PO TB24: 25.0000 mg | ORAL_TABLET | Freq: Every day | ORAL | 5 refills | Status: DC

## 2019-05-30 NOTE — Assessment & Plan Note (Signed)
Based on his creatinine he should be able to restart Myrbetriq.  See follow-up phone note.

## 2019-05-30 NOTE — Telephone Encounter (Addendum)
Notify patient.  Based on his current kidney function he should be able to restart Myrbetriq 25 mg daily.  That should be his maximum dose.  I sent the prescription.  Please update me as needed.  Thanks.

## 2019-05-30 NOTE — Telephone Encounter (Signed)
Patient advised.

## 2019-05-30 NOTE — Assessment & Plan Note (Signed)
It looks like a very superficial skin disruption and I still expect this to heal.  He can keep it covered with a Band-Aid.  It does not appear infected and it is not deep.  He can take pressure off the area by using a toe spacer and then update me as needed.  At this point still okay for outpatient follow-up.

## 2019-06-02 ENCOUNTER — Ambulatory Visit (HOSPITAL_COMMUNITY): Payer: Medicare Other | Attending: Cardiology

## 2019-06-02 ENCOUNTER — Other Ambulatory Visit: Payer: Self-pay

## 2019-06-02 DIAGNOSIS — I5022 Chronic systolic (congestive) heart failure: Secondary | ICD-10-CM | POA: Insufficient documentation

## 2019-06-02 MED ORDER — PERFLUTREN LIPID MICROSPHERE
1.0000 mL | INTRAVENOUS | Status: AC | PRN
  Administered 2019-06-02: 2 mL via INTRAVENOUS

## 2019-06-03 DIAGNOSIS — Z794 Long term (current) use of insulin: Secondary | ICD-10-CM | POA: Diagnosis not present

## 2019-06-03 DIAGNOSIS — H40032 Anatomical narrow angle, left eye: Secondary | ICD-10-CM | POA: Diagnosis not present

## 2019-06-03 DIAGNOSIS — E119 Type 2 diabetes mellitus without complications: Secondary | ICD-10-CM | POA: Diagnosis not present

## 2019-06-03 DIAGNOSIS — H04123 Dry eye syndrome of bilateral lacrimal glands: Secondary | ICD-10-CM | POA: Diagnosis not present

## 2019-06-03 DIAGNOSIS — H4031X3 Glaucoma secondary to eye trauma, right eye, severe stage: Secondary | ICD-10-CM | POA: Diagnosis not present

## 2019-06-14 ENCOUNTER — Other Ambulatory Visit: Payer: Self-pay | Admitting: Family Medicine

## 2019-06-14 DIAGNOSIS — M109 Gout, unspecified: Secondary | ICD-10-CM

## 2019-06-26 ENCOUNTER — Telehealth: Payer: Self-pay

## 2019-06-26 NOTE — Telephone Encounter (Signed)
FAXED [place]: [facility]. (faxed on 06/24/19)  Document: Prescription for Omnipod Other records requested: none at this time.  All above requested information has been faxed successfully to [employer] listed above. Documents and fax confirmation have been placed in the faxed file for future reference.

## 2019-06-27 ENCOUNTER — Other Ambulatory Visit: Payer: Self-pay | Admitting: Endocrinology

## 2019-06-27 ENCOUNTER — Other Ambulatory Visit: Payer: Self-pay

## 2019-06-27 DIAGNOSIS — E890 Postprocedural hypothyroidism: Secondary | ICD-10-CM

## 2019-06-27 MED ORDER — LEVOTHYROXINE SODIUM 137 MCG PO TABS: 137.0000 ug | ORAL_TABLET | Freq: Every day | ORAL | 1 refills | Status: DC

## 2019-07-01 ENCOUNTER — Other Ambulatory Visit: Payer: Self-pay

## 2019-07-01 ENCOUNTER — Ambulatory Visit (INDEPENDENT_AMBULATORY_CARE_PROVIDER_SITE_OTHER): Payer: Medicare Other | Admitting: Endocrinology

## 2019-07-01 ENCOUNTER — Encounter: Payer: Self-pay | Admitting: Endocrinology

## 2019-07-01 VITALS — BP 118/60 | HR 93 | Ht 69.0 in | Wt 242.0 lb

## 2019-07-01 DIAGNOSIS — E1165 Type 2 diabetes mellitus with hyperglycemia: Secondary | ICD-10-CM

## 2019-07-01 DIAGNOSIS — N1832 Chronic kidney disease, stage 3b: Secondary | ICD-10-CM | POA: Diagnosis not present

## 2019-07-01 DIAGNOSIS — I255 Ischemic cardiomyopathy: Secondary | ICD-10-CM | POA: Diagnosis not present

## 2019-07-01 DIAGNOSIS — Z794 Long term (current) use of insulin: Secondary | ICD-10-CM

## 2019-07-01 DIAGNOSIS — E890 Postprocedural hypothyroidism: Secondary | ICD-10-CM | POA: Diagnosis not present

## 2019-07-01 LAB — BASIC METABOLIC PANEL
BUN: 22 mg/dL (ref 6–23)
CO2: 38 mEq/L — ABNORMAL HIGH (ref 19–32)
Calcium: 8.9 mg/dL (ref 8.4–10.5)
Chloride: 94 mEq/L — ABNORMAL LOW (ref 96–112)
Creatinine, Ser: 1.69 mg/dL — ABNORMAL HIGH (ref 0.40–1.50)
GFR: 40.57 mL/min — ABNORMAL LOW (ref >60.00)
Glucose, Bld: 205 mg/dL — ABNORMAL HIGH (ref 70–99)
Potassium: 3.1 mEq/L — ABNORMAL LOW (ref 3.5–5.1)
Sodium: 140 mEq/L (ref 135–145)

## 2019-07-01 LAB — TSH: TSH: 0.79 u[IU]/mL (ref 0.35–4.50)

## 2019-07-01 NOTE — Progress Notes (Signed)
Patient ID: Henry [name], male   DOB: Feb 17, 1950, 69 y.o.   MRN: [id number]           Reason for Appointment: Follow-up for Type 2 Diabetes   History of Present Illness:          Diagnosis: Type 2 diabetes mellitus, date of diagnosis: 2000      Past history:   Patient thinks he has been taking Amaryl for several years and probably metformin since onset also At some point he was changed from metformin to [facility] and was taking this since at least 2012 A1c had been higher in 2015 Since his A1c had been progressively higher with his regimen of Janumet and Amaryl he was started on Victoza in 01/2014 He was started on Lantus insulin in 5/16 because of persistent hyperglycemia especially fasting; was having readings as high as 293 Because of tendency to high postprandial readings and for control with Lantus he was switched to the V-go pump in  09/2014  Recent history:    INSULIN regimen: Omnipod insulin pump with Humalog insulin  Basal rate programs: Midnight = 1.7, 7 AM = 2.3, 11 a.m. = 2.6, total basal 55 units  Boluses 5.5 units for breakfast, 4-6 lunch and 6-9 units for dinner  Non-insulin hypoglycemic drugs the patient is taking are: Ozempic 1 mg weekly  His A1c has been consistently over 8% and last 8.6   Current management, blood sugar patterns and problems identified:  He has had a higher basal rate since his last visit when he reported higher blood sugars  However he still has markedly increased blood sugars averaging nearly 260  As before he is not checking his blood sugars enough and mostly in the mornings only  Blood sugars appear to be higher later in the day  Only once had a normal blood sugar after breakfast and today had a fasting glucose of 141 otherwise blood sugars are consistently over 200  He and his wife do not think that he is going off his diet and is avoiding snacks at night and high-fat meals, he thinks he is eating more vegetables and  fruits  Although he may be more thirsty at times he is only drinking water and sweet drinks  He is not entering his blood sugars at the time of boluses usually in the afternoons and evenings to enable a correction dose  He appears to have lost a little more weight  Had no difficulty doing the boluses and usually bolusing at the time of the meal  Hypoglycemia:   none  He has seen the dietitian in 04/2017  Not able to exercise because of back pain and difficulty walking       Side effects from medications have been: None Compliance with the medical regimen: Fair   Glucose monitoring:  Recently about 2 times  a day         Glucometer:  Freestyle       Blood Glucose readings by download of pump    PRE-MEAL Fasting Lunch Dinner Bedtime Overall  Glucose range:  141-308  80-315  328    Mean/median:  238     258   POST-MEAL PC Breakfast PC Lunch PC Dinner  Glucose range:    284-386  Mean/median:      Previous readings:  PRE-MEAL Fasting Lunch Dinner Bedtime Overall  Glucose range:  143-214  188, 195  201-279    Mean/median:  182   240   201  POST-MEAL PC Breakfast PC Lunch PC Dinner  Glucose range:   306   Mean/median:        Meals: 9 am , 2-3 pm and 6-7 pm            Dietician visit, most recent: 4/19               Weight history:   Wt Readings from Last 3 Encounters:  07/01/19 242 lb (109.8 kg)  05/22/19 245 lb 1 oz (111.2 kg)  05/12/19 250 lb (113.4 kg)    Glycemic control:   Lab Results  Component Value Date   HGBA1C 8.6 (H) 04/24/2019   HGBA1C 8.2 (A) 02/05/2019   HGBA1C 8.3 (H) 08/16/2018   Lab Results  Component Value Date   MICROALBUR <0.7 06/14/2018   LDLCALC 118 (H) 09/16/2015   CREATININE 2.27 (H) 04/24/2019    Lab Results  Component Value Date   FRUCTOSAMINE 277 11/09/2017   FRUCTOSAMINE 350 (H) 03/29/2017   FRUCTOSAMINE 250 09/16/2015     Other active problems: See review of systems      Allergies as of 07/01/2019       Reactions   Penicillins Anaphylaxis, Rash   Because of a history of documented adverse serious drug reaction;Medi Alert bracelet  is recommended PATIENT HAS HAD A PCN REACTION WITH IMMEDIATE RASH, FACIAL/TONGUE/THROAT SWELLING, SOB, OR LIGHTHEADEDNESS WITH HYPOTENSION:  #  #  YES  #  #  Has patient had a PCN reaction causing severe rash involving mucus membranes or skin necrosis: unknown Has patient had a PCN reaction that required hospitalization NO Has patient had a PCN reaction occurring within the last 10 years: NO   Klonopin [clonazepam] Other (See Comments)   agitation   Peanut-containing Drug Products Cough   Watermelon [citrullus Vulgaris] Other (See Comments), Cough   Tickle in throat      Medication List       Accurate as of July 01, 2019  1:17 PM. If you have any questions, ask your nurse or doctor.        acetaminophen 500 MG tablet Commonly known as: TYLENOL Take 500 mg by mouth 2 (two) times daily as needed for mild pain or headache.   albuterol 108 (90 Base) MCG/ACT inhaler Commonly known as: VENTOLIN HFA Inhale 2 puffs into the lungs every 6 (six) hours as needed for wheezing or shortness of breath.   allopurinol 100 MG tablet Commonly known as: ZYLOPRIM TAKE 1 & 1/2 TABLET BY MOUTH DAILY   AMBULATORY NON FORMULARY MEDICATION Ustep walker DX: G20   aspirin EC 81 MG tablet Take 81 mg by mouth daily.   carbidopa-levodopa 50-200 MG tablet Commonly known as: SINEMET CR Take 1 tablet by mouth at bedtime.   Carbidopa-Levodopa ER 25-100 MG tablet controlled release Commonly known as: SINEMET CR TAKE 2 TABLETS 3 times a day   colchicine 0.6 MG tablet Take 1 tablet (0.6 mg total) by mouth 2 (two) times daily as needed (gout).   ferrous sulfate 325 (65 FE) MG EC tablet Take 325 mg by mouth daily.   Fluticasone-Salmeterol 250-50 MCG/DOSE Aepb Commonly known as: Wixela Inhub INHALE 1 PUFF INTO THE LUNGS 2 TIMES DAILY.   furosemide 80 MG tablet Commonly  known as: LASIX TAKE 1 & 1/2 TABLETS BY MOUTH DAILY   gabapentin 100 MG capsule Commonly known as: NEURONTIN TAKE 2 CAPSULES BY MOUTH EVERY MORNING AND TAKE 4 CAPSULES AT BEDTIME   glucose blood test strip Commonly known as:  OneTouch Verio USE AS DIRECTED TO CHECK BLOOD SUGAR TWICE A DAY. DX:E11.65   FREESTYLE LITE test strip Generic drug: glucose blood USE TO CHECK BLOOD SUGAR 4 TIMES DAILY.   HumaLOG 100 UNIT/ML injection Generic drug: insulin lispro USE MAXIMUM 76 UNITS PER   DAY WITH V-GO PUMP   Klor-Con M20 20 MEQ tablet Generic drug: potassium chloride SA TAKE 2 TABLETS BY MOUTH DAILY   levothyroxine 137 MCG tablet Commonly known as: SYNTHROID Take 1 tablet (137 mcg total) by mouth daily before breakfast.   metolazone 2.5 MG tablet Commonly known as: ZAROXOLYN TAKE 1 TABLET BY MOUTH ONCE A WEEK.   metoprolol succinate 50 MG 24 hr tablet Commonly known as: TOPROL-XL TAKE 1 TABLET BY MOUTH DAILY. TAKE WITH OR IMMEDIATELY FOLLOWING A MEAL.   mirabegron ER 25 MG Tb24 tablet Commonly known as: Myrbetriq Take 1 tablet (25 mg total) by mouth daily.   omega-3 acid ethyl esters 1 g capsule Commonly known as: LOVAZA TAKE 2 CAPSULES (2GRAMS TOTAL) BY MOUTH TWICE DAILY.   OmniPod Dash System Kit 1 each by Does not apply route. Use Dash system for Continuous Blood Glucose Monitoring.   OVER THE COUNTER MEDICATION Apply 1 application topically at bedtime. Theraworx Pain Cream   Ozempic (1 MG/DOSE) 2 MG/1.5ML Sopn Generic drug: Semaglutide (1 MG/DOSE) Inject 1 mg into the skin once a week.   polyethylene glycol 17 g packet Commonly known as: MIRALAX / GLYCOLAX Take 17 g by mouth daily.   rosuvastatin 20 MG tablet Commonly known as: CRESTOR TAKE 1 TABLET BY MOUTH EVERY DAY   traMADol 50 MG tablet Commonly known as: ULTRAM TAKE 1 TABLET BY MOUTH 2 TIMES DAILY AS NEEDED FOR MODERATE PAIN   travoprost (benzalkonium) 0.004 % ophthalmic solution Commonly known as:  TRAVATAN Place 1 drop into the right eye at bedtime.   vitamin B-12 1000 MCG tablet Commonly known as: CYANOCOBALAMIN Take 1,000 mcg by mouth daily.   Vitamin D 50 MCG (2000 UT) tablet Take 2,000 Units by mouth daily.       Allergies:  Allergies  Allergen Reactions  . Penicillins Anaphylaxis and Rash    Because of a history of documented adverse serious drug reaction;Medi Alert bracelet  is recommended PATIENT HAS HAD A PCN REACTION WITH IMMEDIATE RASH, FACIAL/TONGUE/THROAT SWELLING, SOB, OR LIGHTHEADEDNESS WITH HYPOTENSION:  #  #  YES  #  #  Has patient had a PCN reaction causing severe rash involving mucus membranes or skin necrosis: unknown Has patient had a PCN reaction that required hospitalization NO Has patient had a PCN reaction occurring within the last 10 years: NO  . Klonopin [Clonazepam] Other (See Comments)    agitation  . Peanut-Containing Drug Products Cough  . Watermelon [Citrullus Vulgaris] Other (See Comments) and Cough    Tickle in throat    Past Medical History:  Diagnosis Date  . AICD (automatic cardioverter/defibrillator) present    Dr Lovena Le office visit yearly, MDT   . Arthritis    cane  . Asthma   . Benign neoplasm of colon   . CAD (coronary artery disease)   . Cardiomyopathy   . Cataract   . Complication of anesthesia    pt states that he got a rash  . Constipation   . 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 DM (diabetes mellitus) ([place])    TYPE 2   . GERD (gastroesophageal reflux disease)   . Glaucoma    right  eye  . HLD (hyperlipidemia)   . HTN (hypertension)    pt denies 08/19/12  . Hyperplasia, prostate   . Hyperthyroidism    thyroid lobectomy  . MI (myocardial infarction) [facility])    Dr Stanford Breed 2000, x3vessels bypass  . Nephrolithiasis   . OSA (obstructive sleep apnea)    AHI-28,on CPAP, noncompliant with CPAP  . Parkinson disease ([place])    1999  . PONV (postoperative nausea and vomiting)   . Restless legs    . Shortness of breath    Hx: of at all times  . Sleep apnea    does not use CPAP  . Sleep apnea, organic   . UTI (lower urinary tract infection) 09/15/12   Klebsiella  . Ventral hernia     Past Surgical History:  Procedure Laterality Date  . 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 BIOPSY  10/14/2018   Procedure: BIOPSY;  Surgeon: Jerene Bears, MD;  Location: [facility];  Service: Gastroenterology;;  . CARDIAC CATHETERIZATION    . CATARACT EXTRACTION W/ INTRAOCULAR LENS IMPLANT     Hx: of right eye  . CATARACT EXTRACTION W/ INTRAOCULAR LENS IMPLANT Left 2018  . COLONOSCOPY N/A 10/13/2014   Procedure: COLONOSCOPY;  Surgeon: Jerene Bears, MD;  Location: [facility] END[facility];  Service: Gastroenterology;  Laterality: N/A;  . COLONOSCOPY W/ BIOPSIES AND POLYPECTOMY     Hx: of  . COLONOSCOPY WITH PROPOFOL N/A 10/14/2018   Procedure: COLONOSCOPY WITH PROPOFOL;  Surgeon: Jerene Bears, MD;  Location: [facility] ENDOSCOPY;  Service: Gastroenterology;  Laterality: N/A;  . CORONARY ARTERY BYPASS GRAFT  2000   [name], MD  . [place]  . DEEP BRAIN STIMULATOR PLACEMENT  2004   Right and left VIN stimulator placement (parkinsons)  . EYE SURGERY    . FINGER AMPUTATION     left pointer  . IMPLANTABLE CARDIOVERTER DEFIBRILLATOR IMPLANT N/A 11/13/2013   Procedure: IMPLANTABLE CARDIOVERTER DEFIBRILLATOR IMPLANT;  Surgeon: Evans Lance, MD;  Location[facility] C[facility] LAB;  Service: Cardiovascular;  Laterality: N/A;  . INSERT / REPLACE / REMOVE PACEMAKER    . LEFT AND RIGHT HEART CATHETERIZATION WITH CORONARY ANGIOGRAM N/A 09/24/2013   Procedure: LEFT AND RIGHT HEART CATHETERIZATION WITH CORONARY ANGIOGRAM;  Surgeon: Burnell Blanks, MD;  Location[facility] CATH LAB;  Service: Cardiovascular;  Laterality: N/A;  . LITHOTRIPSY     3 different times  . MEDIAN STERNOTOMY  2000  . POLYPECTOMY  10/14/2018   Procedure: POLYPECTOMY;  Surgeon: Jerene Bears, MD;  Location: [facility];  Service: Gastroenterology;;  . PULSE GENERATOR IMPLANT Right 11/13/2017   Procedure: Right chest implantable pulse generator change;  Surgeon: Erline Levine, MD;  Location: [place];  Service: Neurosurgery;  Laterality: Right;  Right chest implantable pulse generator change  . SUBTHALAMIC STIMULATOR BATTERY REPLACEMENT N/A 09/05/2012   Procedure: Deep brain stimulator battery change;  Surgeon: Erline Levine, MD;  Location: [place] [facility]URO ORS;  Service: Neurosurgery;  Laterality: N/A;  Deep brain stimulator battery change  . SUBTHALAMIC STIMULATOR BATTERY REPLACEMENT N/A 06/10/2015   Procedure: Deep Brain stimulator battery change;  Surgeon: Erline Levine, MD;  Location: [place] NEURO ORS;  Service: Neurosurgery;  Laterality: N/A;  . TONSILLECTOMY      Family History  Problem Relation Age of Onset  . Aneurysm Mother   . Alcoholism Father   . HIV/AIDS Brother 51       AIDS  . Healthy Sister   .  Healthy Child   . Peripheral vascular disease Other   . Arthritis Other   . Healthy Sister   . Healthy Child   . Healthy Child   . Diabetes Neg Hx   . Heart disease Neg Hx   . Colon cancer Neg Hx   . Prostate cancer Neg Hx     Social History:  reports that he has never smoked. He has never used smokeless tobacco. He reports previous alcohol use. He reports that he does not use drugs.    Review of Systems      HYPERLIPIDEMIA:  He is on Crestor 20 mg with good control of LDL, has low HDL Triglycerides are persistently over 200, treated with Lovaza 2 g twice a day       Lab Results  Component Value Date   CHOL 130 04/24/2019   CHOL 116 09/18/2018   CHOL 130 03/12/2018   Lab Results  Component Value Date   HDL 23.20 (L) 04/24/2019   HDL 25.80 (L) 09/18/2018   HDL 29.00 (L) 03/12/2018   Lab Results  Component Value Date   LDLCALC 118 (H) 09/16/2015   LDLCALC 126 (H) 04/08/2013   LDLCALC 51 02/28/2012   Lab Results  Component Value Date   TRIG 297.0 (H) 04/24/2019   TRIG 270.0  (H) 09/18/2018   TRIG 257.0 (H) 03/12/2018   Lab Results  Component Value Date   CHOLHDL 6 04/24/2019   CHOLHDL 4 09/18/2018   CHOLHDL 4 03/12/2018   Lab Results  Component Value Date   LDLDIRECT 51.0 04/24/2019   LDLDIRECT 54.0 09/18/2018   LDLDIRECT 67.0 03/12/2018                  Thyroid:   He has had hypothyroidism following radioactive iodine treatment for hyperthyroidism in 2013   TSH is relatively higher on the last visit and is now taking 137 mcg levothyroxine   Lab Results  Component Value Date   TSH 5.40 (H) 04/24/2019   TSH 2.64 08/16/2018   TSH 3.51 03/12/2018   FREET4 0.77 04/24/2019   FREET4 0.88 08/16/2018   FREET4 0.75 03/12/2018   Has chronic systolic CHF Blood pressure history:  BP Readings from Last 3 Encounters:  07/01/19 118/60  05/22/19 (!) 102/58  05/12/19 130/76     CKD: Has variable creatinine levels as follows   Lab Results  Component Value Date   CREATININE 2.27 (H) 04/24/2019   CREATININE 1.94 (H) 08/16/2018   CREATININE 1.68 (H) 07/01/2018    Neuropathy: Taking gabapentin 100 mg from his PCP.  Also has had some history of numbness in his toes  Diabetic foot exam in 08/2018  Last eye exam 11/20, negative  Physical Examination:  BP 118/60   Pulse 93   Ht '5\' 9"'$  (1.753 m)   Wt 242 lb (109.8 kg)   SpO2 98%   BMI 35.74 kg/m      ASSESSMENT/PLAN  Diabetes type 2, insulin-dependent with obesity     See history of present illness for detailed discussion of his current management, blood sugar patterns and problems identified  His A1c is persistently over 8% and last 8.6   He is using the Omni pod insulin pump with the older PDM and also Ozempic 1 mg   He is appearing to be getting more insulin resistant and despite increasing his insulin basal rate and Ozempic his blood sugars recently are higher  Morning sugars are also higher without any nocturnal snacking indicating need for  higher insulin doses  We will need  to likely increase both basal and bolus insulins  Also discussed importance of checking blood sugar at the time of each meal and entering the reading to help make a correction dose for high readings  Basal rate at midnight will be 1.9, 7 AM 2.4 and 11 AM 3.0, his wife is able to change the settings with assistance  Carbohydrate ratio will be 1: 8  Follow-up in 2 months  HYPOTHYROIDISM: TSH to be checked today  RENAL dysfunction: To have labs done today Will also need urine microalbumin checked    There are no Patient Instructions on file for this visit.        Elayne Snare 07/01/2019, 1:17 PM   Note: This office note was prepared with Dragon voice recognition system technology. Any transcriptional errors that result from this process are unintentional.

## 2019-07-01 NOTE — Patient Instructions (Signed)
Check blood sugars on waking up 7 days a week  Also check blood sugars before and about 2 hours after meals and do this after different meals by rotation  Recommended blood sugar levels on waking up are 90-130 and about 2 hours after meal is 130-180  Please bring your blood sugar monitor to each visit, thank you  Bolus 15 min before meals

## 2019-07-02 LAB — FRUCTOSAMINE: Fructosamine: 359 umol/L — ABNORMAL HIGH (ref 0–285)

## 2019-07-09 ENCOUNTER — Other Ambulatory Visit: Payer: Self-pay | Admitting: Family Medicine

## 2019-07-09 DIAGNOSIS — I509 Heart failure, unspecified: Secondary | ICD-10-CM

## 2019-07-09 DIAGNOSIS — E876 Hypokalemia: Secondary | ICD-10-CM

## 2019-07-09 MED ORDER — POTASSIUM CHLORIDE CRYS ER 20 MEQ PO TBCR: 60.0000 meq | EXTENDED_RELEASE_TABLET | Freq: Every day | ORAL | Status: DC

## 2019-07-16 ENCOUNTER — Other Ambulatory Visit: Payer: Self-pay | Admitting: Family Medicine

## 2019-07-16 NOTE — Telephone Encounter (Signed)
Refill request Tramadol Last office visit 05/22/19 Last refill 04/15/19 #60/2

## 2019-07-17 NOTE — Telephone Encounter (Signed)
Sent. Thanks.   

## 2019-07-21 ENCOUNTER — Other Ambulatory Visit: Payer: Self-pay

## 2019-07-21 ENCOUNTER — Other Ambulatory Visit (INDEPENDENT_AMBULATORY_CARE_PROVIDER_SITE_OTHER): Payer: Medicare Other

## 2019-07-21 DIAGNOSIS — E876 Hypokalemia: Secondary | ICD-10-CM | POA: Diagnosis not present

## 2019-07-21 LAB — BASIC METABOLIC PANEL
BUN: 20 mg/dL (ref 6–23)
CO2: 35 mEq/L — ABNORMAL HIGH (ref 19–32)
Calcium: 9.2 mg/dL (ref 8.4–10.5)
Chloride: 96 mEq/L (ref 96–112)
Creatinine, Ser: 1.49 mg/dL (ref 0.40–1.50)
GFR: 46.9 mL/min — ABNORMAL LOW (ref >60.00)
Glucose, Bld: 108 mg/dL — ABNORMAL HIGH (ref 70–99)
Potassium: 3.4 mEq/L — ABNORMAL LOW (ref 3.5–5.1)
Sodium: 139 mEq/L (ref 135–145)

## 2019-07-23 ENCOUNTER — Ambulatory Visit (HOSPITAL_COMMUNITY)
Admission: RE | Admit: 2019-07-23 | Discharge: 2019-07-23 | Disposition: A | Discharge status: Home / Self Care | Payer: Medicare Other | Source: Ambulatory Visit | Attending: Nurse Practitioner | Admitting: Nurse Practitioner

## 2019-07-23 ENCOUNTER — Other Ambulatory Visit: Payer: Self-pay

## 2019-07-23 ENCOUNTER — Ambulatory Visit (INDEPENDENT_AMBULATORY_CARE_PROVIDER_SITE_OTHER): Payer: Medicare Other | Admitting: *Deleted

## 2019-07-23 ENCOUNTER — Telehealth: Payer: Self-pay | Admitting: Emergency Medicine

## 2019-07-23 VITALS — BP 84/60 | HR 71 | Ht 69.0 in | Wt 239.6 lb

## 2019-07-23 DIAGNOSIS — Z95810 Presence of automatic (implantable) cardiac defibrillator: Secondary | ICD-10-CM | POA: Insufficient documentation

## 2019-07-23 DIAGNOSIS — I255 Ischemic cardiomyopathy: Secondary | ICD-10-CM

## 2019-07-23 DIAGNOSIS — H9193 Unspecified hearing loss, bilateral: Secondary | ICD-10-CM | POA: Diagnosis not present

## 2019-07-23 DIAGNOSIS — Z87440 Personal history of urinary (tract) infections: Secondary | ICD-10-CM | POA: Insufficient documentation

## 2019-07-23 DIAGNOSIS — Z794 Long term (current) use of insulin: Secondary | ICD-10-CM | POA: Insufficient documentation

## 2019-07-23 DIAGNOSIS — E785 Hyperlipidemia, unspecified: Secondary | ICD-10-CM | POA: Diagnosis not present

## 2019-07-23 DIAGNOSIS — Z9119 Patient's noncompliance with other medical treatment and regimen: Secondary | ICD-10-CM | POA: Diagnosis not present

## 2019-07-23 DIAGNOSIS — H409 Unspecified glaucoma: Secondary | ICD-10-CM | POA: Insufficient documentation

## 2019-07-23 DIAGNOSIS — Z885 Allergy status to narcotic agent status: Secondary | ICD-10-CM | POA: Insufficient documentation

## 2019-07-23 DIAGNOSIS — I10 Essential (primary) hypertension: Secondary | ICD-10-CM | POA: Insufficient documentation

## 2019-07-23 DIAGNOSIS — Z951 Presence of aortocoronary bypass graft: Secondary | ICD-10-CM | POA: Diagnosis not present

## 2019-07-23 DIAGNOSIS — Z9842 Cataract extraction status, left eye: Secondary | ICD-10-CM | POA: Diagnosis not present

## 2019-07-23 DIAGNOSIS — I252 Old myocardial infarction: Secondary | ICD-10-CM | POA: Diagnosis not present

## 2019-07-23 DIAGNOSIS — Z880 Allergy status to penicillin: Secondary | ICD-10-CM | POA: Insufficient documentation

## 2019-07-23 DIAGNOSIS — Z9841 Cataract extraction status, right eye: Secondary | ICD-10-CM | POA: Diagnosis not present

## 2019-07-23 DIAGNOSIS — I429 Cardiomyopathy, unspecified: Secondary | ICD-10-CM | POA: Diagnosis not present

## 2019-07-23 DIAGNOSIS — Z79890 Hormone replacement therapy (postmenopausal): Secondary | ICD-10-CM | POA: Insufficient documentation

## 2019-07-23 DIAGNOSIS — E890 Postprocedural hypothyroidism: Secondary | ICD-10-CM | POA: Insufficient documentation

## 2019-07-23 DIAGNOSIS — G4733 Obstructive sleep apnea (adult) (pediatric): Secondary | ICD-10-CM | POA: Diagnosis not present

## 2019-07-23 DIAGNOSIS — Z87442 Personal history of urinary calculi: Secondary | ICD-10-CM | POA: Insufficient documentation

## 2019-07-23 DIAGNOSIS — H42 Glaucoma in diseases classified elsewhere: Secondary | ICD-10-CM | POA: Diagnosis not present

## 2019-07-23 DIAGNOSIS — I4891 Unspecified atrial fibrillation: Secondary | ICD-10-CM | POA: Diagnosis not present

## 2019-07-23 DIAGNOSIS — K219 Gastro-esophageal reflux disease without esophagitis: Secondary | ICD-10-CM | POA: Insufficient documentation

## 2019-07-23 DIAGNOSIS — G20 Parkinson's disease: Secondary | ICD-10-CM | POA: Insufficient documentation

## 2019-07-23 DIAGNOSIS — Z7982 Long term (current) use of aspirin: Secondary | ICD-10-CM | POA: Insufficient documentation

## 2019-07-23 DIAGNOSIS — I6523 Occlusion and stenosis of bilateral carotid arteries: Secondary | ICD-10-CM | POA: Insufficient documentation

## 2019-07-23 DIAGNOSIS — E1139 Type 2 diabetes mellitus with other diabetic ophthalmic complication: Secondary | ICD-10-CM | POA: Diagnosis not present

## 2019-07-23 DIAGNOSIS — M1990 Unspecified osteoarthritis, unspecified site: Secondary | ICD-10-CM | POA: Diagnosis not present

## 2019-07-23 DIAGNOSIS — Z8249 Family history of ischemic heart disease and other diseases of the circulatory system: Secondary | ICD-10-CM | POA: Insufficient documentation

## 2019-07-23 DIAGNOSIS — I25810 Atherosclerosis of coronary artery bypass graft(s) without angina pectoris: Secondary | ICD-10-CM | POA: Diagnosis not present

## 2019-07-23 DIAGNOSIS — K5900 Constipation, unspecified: Secondary | ICD-10-CM | POA: Insufficient documentation

## 2019-07-23 DIAGNOSIS — Z79899 Other long term (current) drug therapy: Secondary | ICD-10-CM | POA: Insufficient documentation

## 2019-07-23 LAB — CBC
HCT: 42 % (ref 39.0–52.0)
Hemoglobin: 13.5 g/dL (ref 13.0–17.0)
MCH: 28.5 pg (ref 26.0–34.0)
MCHC: 32.1 g/dL (ref 30.0–36.0)
MCV: 88.8 fL (ref 80.0–100.0)
Platelets: 214 10*3/uL (ref 150–400)
RBC: 4.73 MIL/uL (ref 4.22–5.81)
RDW: 14 % (ref 11.5–15.5)
WBC: 8.5 10*3/uL (ref 4.0–10.5)
nRBC: 0 % (ref 0.0–0.2)

## 2019-07-23 LAB — CUP PACEART REMOTE DEVICE CHECK
Battery Remaining Longevity: 48 mo
Battery Voltage: 2.97 V
Brady Statistic AP VP Percent: 0.02 %
Brady Statistic AP VS Percent: 9.8 %
Brady Statistic AS VP Percent: 0.08 %
Brady Statistic AS VS Percent: 90.1 %
Brady Statistic RA Percent Paced: 9.41 %
Brady Statistic RV Percent Paced: 0.09 %
Date Time Interrogation Session: 20210714022604
HighPow Impedance: 80 Ohm
Implantable Lead Implant Date: 20151105
Implantable Lead Implant Date: 20151105
Implantable Lead Location: 753859
Implantable Lead Location: 753860
Implantable Lead Model: 5076
Implantable Pulse Generator Implant Date: 20151105
Lead Channel Impedance Value: 456 Ohm
Lead Channel Impedance Value: 456 Ohm
Lead Channel Impedance Value: 532 Ohm
Lead Channel Pacing Threshold Amplitude: 0.5 V
Lead Channel Pacing Threshold Amplitude: 0.875 V
Lead Channel Pacing Threshold Pulse Width: 0.4 ms
Lead Channel Pacing Threshold Pulse Width: 0.4 ms
Lead Channel Sensing Intrinsic Amplitude: 1.625 mV
Lead Channel Sensing Intrinsic Amplitude: 1.625 mV
Lead Channel Sensing Intrinsic Amplitude: 19.875 mV
Lead Channel Sensing Intrinsic Amplitude: 19.875 mV
Lead Channel Setting Pacing Amplitude: 2 V
Lead Channel Setting Pacing Amplitude: 2.5 V
Lead Channel Setting Pacing Pulse Width: 0.4 ms
Lead Channel Setting Sensing Sensitivity: 0.3 mV

## 2019-07-23 MED ORDER — APIXABAN 5 MG PO TABS
5.0000 mg | ORAL_TABLET | Freq: Two times a day (BID) | ORAL | 3 refills | Status: DC
Start: 2019-07-23 — End: 2019-10-30

## 2019-07-23 NOTE — Patient Instructions (Signed)
Stop aspirin  Start Eliquis 5 mg twice a day.

## 2019-07-23 NOTE — Progress Notes (Signed)
Primary Care Physician: Tonia Ghent, MD Referring Physician: Dr. Lovena Le  Cardiologist: Dr. Marnette Burgess Henry [name] is a 69 y.o. male with a h/o bypass and graft, Parkinson's, diabetes and hypertension as well as hyperlipidemia. Carotid Dopplers in January 2005 showed 0-39% stenosis. Last nuclear study 6/15 showed EF 41, prior MI; no ischemia. Echo 8/15 showed EF 30-35, moderate LAE. Cardiac catheterization September 2015 showed a pulmonary Wedge pressure of 16 and PA pressure 32/20. The LAD was occluded. No obstructive disease in the circumflex. The right coronary had severe diffuse distal disease. Saphenous vein graft to the PDA was patent. Saphenous vein graft to diagonal patent. LIMA to the mid LAD patent. Medical therapy recommended. Had ICD placed November 2015 foolwoed by Dr. Lovena Le.   He is being seen in the  afib clinic  today for 8 AT/AF episodes that occurred from 06/17/19 to 07/05/19. EGMs did show episodes of AF with controlled v-rates with the longest episode lasting 1 hour and 6 minutes, AT/AF burden < 0.1%. Optivol has been elevated since mid June.No [place] on board. Meds include  Toprol XL 50 mg daily, Lasix 80 mg in am and 40 mg in the afternoon. Labs 07/21/19 show K+ 3.4 which is up from 3.1 on labs from 3 weeks ago.   In  the afib clinic today, he is in [place]. He really was not aware of any of the episodes.  He has a low BP 2/2 Parkinson's and I do not feel BB can be increased. He denies a bleeding history. He walks poorly and mostly is in a W/C,  no history of repeated falls. He is here with his daughter and wife today. CHA2DS2VASc score of at least 5.    Today, he denies symptoms of palpitations, chest pain, shortness of breath, orthopnea, PND, lower extremity edema, dizziness, presyncope, syncope, or neurologic sequela. The patient is tolerating medications without difficulties and is otherwise without complaint today.   Past Medical History:  Diagnosis Date  . AICD  (automatic cardioverter/defibrillator) present    Dr Lovena Le office visit yearly, MDT   . Arthritis    cane  . Asthma   . Benign neoplasm of colon   . CAD (coronary artery disease)   . Cardiomyopathy   . Cataract   . Complication of anesthesia    pt states that he got a rash  . Constipation   . 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 DM (diabetes mellitus) ([place])    TYPE 2   . GERD (gastroesophageal reflux disease)   . Glaucoma    right eye  . HLD (hyperlipidemia)   . HTN (hypertension)    pt denies 08/19/12  . Hyperplasia, prostate   . Hyperthyroidism    thyroid lobectomy  . MI (myocardial infarction) [facility])    Dr Stanford Breed 2000, x3vessels bypass  . Nephrolithiasis   . OSA (obstructive sleep apnea)    AHI-28,on CPAP, noncompliant with CPAP  . Parkinson disease ([place])    1999  . PONV (postoperative nausea and vomiting)   . Restless legs   . Shortness of breath    Hx: of at all times  . Sleep apnea    does not use CPAP  . Sleep apnea, organic   . UTI (lower urinary tract infection) 09/15/12   Klebsiella  . Ventral hernia    Past Surgical History:  Procedure Laterality Date  . 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  BIOPSY  10/14/2018   Procedure: BIOPSY;  Surgeon: Jerene Bears, MD;  Location: [facility];  Service: Gastroenterology;;  . CARDIAC CATHETERIZATION    . CATARACT EXTRACTION W/ INTRAOCULAR LENS IMPLANT     Hx: of right eye  . CATARACT EXTRACTION W/ INTRAOCULAR LENS IMPLANT Left 2018  . COLONOSCOPY N/A 10/13/2014   Procedure: COLONOSCOPY;  Surgeon: Jerene Bears, MD;  Location: [facility] END[facility];  Service: Gastroenterology;  Laterality: N/A;  . COLONOSCOPY W/ BIOPSIES AND POLYPECTOMY     Hx: of  . COLONOSCOPY WITH PROPOFOL N/A 10/14/2018   Procedure: COLONOSCOPY WITH PROPOFOL;  Surgeon: Jerene Bears, MD;  Location: [facility] ENDOSCOPY;  Service: Gastroenterology;  Laterality: N/A;  . CORONARY ARTERY BYPASS GRAFT  2000   [name],  MD  . [place]  . DEEP BRAIN STIMULATOR PLACEMENT  2004   Right and left VIN stimulator placement (parkinsons)  . EYE SURGERY    . FINGER AMPUTATION     left pointer  . IMPLANTABLE CARDIOVERTER DEFIBRILLATOR IMPLANT N/A 11/13/2013   Procedure: IMPLANTABLE CARDIOVERTER DEFIBRILLATOR IMPLANT;  Surgeon: Evans Lance, MD;  Location: [facility] C[facility] LAB;  Service: Cardiovascular;  Laterality: N/A;  . INSERT / REPLACE / REMOVE PACEMAKER    . LEFT AND RIGHT HEART CATHETERIZATION WITH CORONARY ANGIOGRAM N/A 09/24/2013   Procedure: LEFT AND RIGHT HEART CATHETERIZATION WITH CORONARY ANGIOGRAM;  Surgeon: Burnell Blanks, MD;  Location: [facility] CATH LAB;  Service: Cardiovascular;  Laterality: N/A;  . LITHOTRIPSY     3 different times  . MEDIAN STERNOTOMY  2000  . POLYPECTOMY  10/14/2018   Procedure: POLYPECTOMY;  Surgeon: Jerene Bears, MD;  Location: [facility];  Service: Gastroenterology;;  . PULSE GENERATOR IMPLANT Right 11/13/2017   Procedure: Right chest implantable pulse generator change;  Surgeon: Erline Levine, MD;  Location: [place];  Service: Neurosurgery;  Laterality: Right;  Right chest implantable pulse generator change  . SUBTHALAMIC STIMULATOR BATTERY REPLACEMENT N/A 09/05/2012   Procedure: Deep brain stimulator battery change;  Surgeon: Erline Levine, MD;  Location: [facility];  Service: Neurosurgery;  Laterality: N/A;  Deep brain stimulator battery change  . SUBTHALAMIC STIMULATOR BATTERY REPLACEMENT N/A 06/10/2015   Procedure: Deep Brain stimulator battery change;  Surgeon: Erline Levine, MD;  Location: [place] [facility]UR[facility];  Service: Neurosurgery;  Laterality: N/A;  . TONSILLECTOMY      Current Outpatient Medications  Medication Sig Dispense Refill  . acetaminophen (TYLENOL) 500 MG tablet Take 500 mg by mouth 2 (two) times daily as needed for mild pain or headache.     . albuterol (VENTOLIN HFA) 108 (90 Base) MCG/ACT inhaler Inhale 2 puffs into the lungs every 6 (six) hours as  needed for wheezing or shortness of breath. 18 g 5  . allopurinol (ZYLOPRIM) 100 MG tablet TAKE 1 & 1/2 TABLET BY MOUTH DAILY 135 tablet 3  . AMBULATORY NON FORMULARY MEDICATION Ustep walker DX: G20 1 Device 0  . aspirin EC 81 MG tablet Take 81 mg by mouth daily.    . carbidopa-levodopa (SINEMET CR) 50-200 MG tablet Take 1 tablet by mouth at bedtime. 90 tablet 1  . Carbidopa-Levodopa ER (SINEMET CR) 25-100 MG tablet controlled release TAKE 2 TABLETS 3 times a day    . Cholecalciferol (VITAMIN D) 50 MCG (2000 UT) tablet Take 2,000 Units by mouth daily.     . colchicine 0.6 MG tablet Take 1 tablet (0.6 mg total) by mouth 2 (two) times daily as needed (gout). [place]  tablet 1  . ferrous sulfate 325 (65 FE) MG EC tablet Take 325 mg by mouth daily.     . Fluticasone-Salmeterol (WIXELA INHUB) 250-50 MCG/DOSE AEPB INHALE 1 PUFF INTO THE LUNGS 2 TIMES DAILY. 180 each 3  . FREESTYLE LITE test strip USE TO CHECK BLOOD SUGAR 4 TIMES DAILY. 150 strip 3  . furosemide (LASIX) 80 MG tablet TAKE 1 & 1/2 TABLETS BY MOUTH DAILY 135 tablet 0  . gabapentin (NEURONTIN) 100 MG capsule TAKE 2 CAPSULES BY MOUTH EVERY MORNING AND TAKE 4 CAPSULES AT BEDTIME 540 capsule 1  . glucose blood (ONETOUCH VERIO) test strip USE AS DIRECTED TO CHECK BLOOD SUGAR TWICE A DAY. DX:E11.65 100 each 3  . HUMALOG 100 UNIT/ML injection USE MAXIMUM 76 UNITS PER   DAY WITH V-GO PUMP 60 mL 2  . Insulin Disposable Pump (OMNIPOD DASH SYSTEM) KIT 1 each by Does not apply route. Use Dash system for Continuous Blood Glucose Monitoring.    Marland Kitchen levothyroxine (SYNTHROID) 137 MCG tablet Take 1 tablet (137 mcg total) by mouth daily before breakfast. 90 tablet 1  . metolazone (ZAROXOLYN) 2.5 MG tablet TAKE 1 TABLET BY MOUTH ONCE A WEEK.    . metoprolol succinate (TOPROL-XL) 50 MG 24 hr tablet TAKE 1 TABLET BY MOUTH DAILY. TAKE WITH OR IMMEDIATELY FOLLOWING A MEAL. 90 tablet 2  . mirabegron ER (MYRBETRIQ) 25 MG TB24 tablet Take 1 tablet (25 mg total) by mouth  daily. 30 tablet 5  . omega-3 acid ethyl esters (LOVAZA) 1 g capsule TAKE 2 CAPSULES (2GRAMS TOTAL) BY MOUTH TWICE DAILY. 360 capsule 1  . OVER THE COUNTER MEDICATION Apply 1 application topically at bedtime. Theraworx Pain Cream    . polyethylene glycol (MIRALAX / GLYCOLAX) packet Take 17 g by mouth daily.     . potassium chloride SA (KLOR-CON M20) 20 MEQ tablet Take 3 tablets (60 mEq total) by mouth daily.    . rosuvastatin (CRESTOR) 20 MG tablet TAKE 1 TABLET BY MOUTH EVERY DAY 90 tablet 3  . Semaglutide, 1 MG/DOSE, (OZEMPIC, 1 MG/DOSE,) 2 MG/1.5ML SOPN Inject 1 mg into the skin once a week. 3 pen 1  . traMADol (ULTRAM) 50 MG tablet TAKE 1 TABLET BY MOUTH TWICE A DAY AS NEEDED FOR MODERATE PAIN 60 tablet 2  . travoprost, benzalkonium, (TRAVATAN) 0.004 % ophthalmic solution Place 1 drop into the right eye at bedtime.     . vitamin B-12 (CYANOCOBALAMIN) 1000 MCG tablet Take 1,000 mcg by mouth daily.     No current facility-administered medications for this encounter.    Allergies  Allergen Reactions  . Penicillins Anaphylaxis and Rash    Because of a history of documented adverse serious drug reaction;Medi Alert bracelet  is recommended PATIENT HAS HAD A PCN REACTION WITH IMMEDIATE RASH, FACIAL/TONGUE/THROAT SWELLING, SOB, OR LIGHTHEADEDNESS WITH HYPOTENSION:  #  #  YES  #  #  Has patient had a PCN reaction causing severe rash involving mucus membranes or skin necrosis: unknown Has patient had a PCN reaction that required hospitalization NO Has patient had a PCN reaction occurring within the last 10 years: NO  . Klonopin [Clonazepam] Other (See Comments)    agitation  . Peanut-Containing Drug Products Cough  . Watermelon [Citrullus Vulgaris] Other (See Comments) and Cough    Tickle in throat    Social History   Socioeconomic History  . Marital status: Married    Spouse name: [name]  . Number of children: 2  . Years of education:  12  . Highest education level: High school graduate   Occupational History  . 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 Smoking status: Never Smoker  . Smokeless tobacco: Never Used  Vaping Use  . Vaping Use: Never used  Substance and Sexual Activity  . Alcohol use: Not Currently  . Drug use: No  . Sexual activity: Not Currently  Other Topics Concern  . Not on file  Social History Narrative   From [facility]   Retired/disability Clinical research associate   Likes to fish.     Married 1972   3 kids   [place] fan   Social Determinants of Health   Financial Resource Strain: Low Risk   . Difficulty of Paying Living Expenses: Not hard at all  Food Insecurity: No Food Insecurity  . Worried About Charity fundraiser in the Last Year: Never true  . Ran Out of Food in the Last Year: Never true  Transportation Needs: No Transportation Needs  . Lack of Transportation (Medical): No  . Lack of Transportation (Non-Medical): No  Physical Activity: Insufficiently Active  . Days of Exercise per Week: 3 days  . Minutes of Exercise per Session: 10 min  Stress: No Stress Concern Present  . Feeling of Stress : Not at all  Social Connections:   . Frequency of Communication with Friends and Family:   . Frequency of Social Gatherings with Friends and Family:   . Attends Religious Services:   . Active Member of Clubs or Organizations:   . Attends Archivist Meetings:   Marland Kitchen Marital Status:   Intimate Partner Violence: Not At Risk  . Fear of Current or Ex-Partner: No  . Emotionally Abused: No  . Physically Abused: No  . Sexually Abused: No    Family History  Problem Relation Age of Onset  . Aneurysm Mother   . Alcoholism Father   . HIV/AIDS Brother 89       AIDS  . Healthy Sister   . Healthy Child   . Peripheral vascular disease Other   . Arthritis Other   . Healthy Sister   . Healthy Child   . Healthy Child   . Diabetes Neg Hx   . Heart disease Neg Hx   . Colon cancer Neg Hx   . Prostate  cancer Neg Hx     ROS- All systems are reviewed and negative except as per the HPI above  Physical Exam: There were no vitals filed for this visit. Wt Readings from Last 3 Encounters:  07/01/19 109.8 kg  05/22/19 111.2 kg  05/12/19 113.4 kg    Labs: Lab Results  Component Value Date   NA 139 07/21/2019   K 3.4 (L) 07/21/2019   CL 96 07/21/2019   CO2 35 (H) 07/21/2019   GLUCOSE 108 (H) 07/21/2019   BUN 20 07/21/2019   CREATININE 1.49 07/21/2019   CALCIUM 9.2 07/21/2019   Lab Results  Component Value Date   INR 0.9 09/22/2013   Lab Results  Component Value Date   CHOL 130 04/24/2019   HDL 23.20 (L) 04/24/2019   LDLCALC 118 (H) 09/16/2015   TRIG 297.0 (H) 04/24/2019     GEN- The patient is well appearing, alert and oriented x 3 today.   Head- normocephalic, atraumatic Eyes-  Sclera clear, conjunctiva pink Ears- hearing intact Oropharynx- clear Neck- supple, no JVP Lymph- no cervical lymphadenopathy Lungs- Clear to ausculation bilaterally, normal work of breathing Heart-  Regular rate and rhythm, no murmurs, rubs or gallops, PMI not laterally displaced GI- soft, NT, ND, + BS Extremities- no clubbing, cyanosis, or edema MS- no significant deformity or atrophy Skin- no rash or lesion Psych- euthymic mood, full affect Neuro- strength and sensation are intact  EKG-Sinus rhythm at 71 bpm, pr int 226 ms, qrs int 82 ms, qtc 447 ms  Device clinic -1 day transmission received that showed patient  had 8 AT/AF episodes that occurred from 06/17/19 to 07/05/19. EGMs did show episodes of AF with controlled v-rates with the longest episode lasting 1 hour and 6 minutes, AT/AF burden < 0.1%. Optivol has been elevated since mid June.No OAC, , Toprol XL 50 mg daily, Lasix 80 mg in am and 40 mg in the afternoon. Labs 07/21/19 show K+ 3.4 which is up from 3.1 on labs from 3 weeks ago.    Assessment and Plan: 1. New onset afib General education re afib No triggers identified other than  that is the same day that his wife had a procedure and he was worried about her that he episodes  afib low burden so far  Pt asymptomatic or minimally  asymptomatic Cannot increase BB as he has a low BP 2/2 Parkinson's  In SR today   2. CHA2DS2VASc score of 5 Stroke/bleeding risk discussed Denies a bleeding history  Cbc for baseline  Will start eliquis 5 mg bid  Stop ASA  Will f/u in afib clinic in 3 weeks   Butch Penny C. Colie Fugitt, [facility] [address] [place], [place] [address] [phone]

## 2019-07-23 NOTE — Telephone Encounter (Signed)
Called and spoke with pt and spouse.  They are aware of appt this afternoon at 3 pm with Roderic Palau, NP.

## 2019-07-23 NOTE — Telephone Encounter (Signed)
(  1 day transmission received that showed patient  had 8 AT/AF episodes that occurred from 06/17/19 to 07/05/19. EGMs did show episodes of AF with controlled v-rates with the longest episode lasting 1 hour and 6 minutes, AT/AF burden < 0.1%. Optivol has been elevated since mid June.No OAC, , Toprol XL 50 mg daily, Lasix 80 mg in am and 40 mg in the afternoon. Labs 07/21/19 show K+ 3.4 which is up from 3.1 on labs from 3 weeks ago.  Patient reports he has been awaken by " funny feeling " in his chest at night  or in early morning a few times over the last month. He reports he has not had any CP, SOB, chest pressure , dizziness or syncope. He does report that he has to take a "real deep breath" occasionally. Reports he has not had edema in BLE. Patient aware that Dr Lovena Le will be made aware of the episodes of AT/AF and if there is any change to his treatment regimen then we will call him back. Patient made aware that he may be referred to the AF Clinic and is open to be seen there for evaluation. Dr Lovena Le consulted and patient will be referred to AF Clinic.

## 2019-07-24 NOTE — Progress Notes (Signed)
Remote ICD transmission.   

## 2019-08-19 ENCOUNTER — Encounter (HOSPITAL_COMMUNITY): Payer: Self-pay | Admitting: Nurse Practitioner

## 2019-08-19 ENCOUNTER — Other Ambulatory Visit: Payer: Self-pay

## 2019-08-19 ENCOUNTER — Ambulatory Visit (HOSPITAL_COMMUNITY)
Admission: RE | Admit: 2019-08-19 | Discharge: 2019-08-19 | Disposition: A | Discharge status: Home / Self Care | Payer: Medicare Other | Source: Ambulatory Visit | Attending: Nurse Practitioner | Admitting: Nurse Practitioner

## 2019-08-19 VITALS — BP 108/70 | HR 76 | Ht 69.0 in | Wt 245.0 lb

## 2019-08-19 DIAGNOSIS — E1139 Type 2 diabetes mellitus with other diabetic ophthalmic complication: Secondary | ICD-10-CM | POA: Diagnosis not present

## 2019-08-19 DIAGNOSIS — Z794 Long term (current) use of insulin: Secondary | ICD-10-CM | POA: Insufficient documentation

## 2019-08-19 DIAGNOSIS — Z955 Presence of coronary angioplasty implant and graft: Secondary | ICD-10-CM | POA: Insufficient documentation

## 2019-08-19 DIAGNOSIS — Z87440 Personal history of urinary (tract) infections: Secondary | ICD-10-CM | POA: Insufficient documentation

## 2019-08-19 DIAGNOSIS — E0590 Thyrotoxicosis, unspecified without thyrotoxic crisis or storm: Secondary | ICD-10-CM | POA: Insufficient documentation

## 2019-08-19 DIAGNOSIS — I6529 Occlusion and stenosis of unspecified carotid artery: Secondary | ICD-10-CM | POA: Insufficient documentation

## 2019-08-19 DIAGNOSIS — I10 Essential (primary) hypertension: Secondary | ICD-10-CM | POA: Insufficient documentation

## 2019-08-19 DIAGNOSIS — Z9119 Patient's noncompliance with other medical treatment and regimen: Secondary | ICD-10-CM | POA: Insufficient documentation

## 2019-08-19 DIAGNOSIS — Z9842 Cataract extraction status, left eye: Secondary | ICD-10-CM | POA: Diagnosis not present

## 2019-08-19 DIAGNOSIS — G20 Parkinson's disease: Secondary | ICD-10-CM | POA: Diagnosis not present

## 2019-08-19 DIAGNOSIS — H42 Glaucoma in diseases classified elsewhere: Secondary | ICD-10-CM | POA: Insufficient documentation

## 2019-08-19 DIAGNOSIS — Z961 Presence of intraocular lens: Secondary | ICD-10-CM | POA: Insufficient documentation

## 2019-08-19 DIAGNOSIS — I429 Cardiomyopathy, unspecified: Secondary | ICD-10-CM | POA: Diagnosis not present

## 2019-08-19 DIAGNOSIS — L299 Pruritus, unspecified: Secondary | ICD-10-CM | POA: Insufficient documentation

## 2019-08-19 DIAGNOSIS — Z7901 Long term (current) use of anticoagulants: Secondary | ICD-10-CM | POA: Insufficient documentation

## 2019-08-19 DIAGNOSIS — J45909 Unspecified asthma, uncomplicated: Secondary | ICD-10-CM | POA: Insufficient documentation

## 2019-08-19 DIAGNOSIS — I2510 Atherosclerotic heart disease of native coronary artery without angina pectoris: Secondary | ICD-10-CM | POA: Diagnosis not present

## 2019-08-19 DIAGNOSIS — G4733 Obstructive sleep apnea (adult) (pediatric): Secondary | ICD-10-CM | POA: Diagnosis not present

## 2019-08-19 DIAGNOSIS — Z95810 Presence of automatic (implantable) cardiac defibrillator: Secondary | ICD-10-CM | POA: Insufficient documentation

## 2019-08-19 DIAGNOSIS — Z880 Allergy status to penicillin: Secondary | ICD-10-CM | POA: Insufficient documentation

## 2019-08-19 DIAGNOSIS — Z9841 Cataract extraction status, right eye: Secondary | ICD-10-CM | POA: Insufficient documentation

## 2019-08-19 DIAGNOSIS — H409 Unspecified glaucoma: Secondary | ICD-10-CM | POA: Insufficient documentation

## 2019-08-19 DIAGNOSIS — I252 Old myocardial infarction: Secondary | ICD-10-CM | POA: Insufficient documentation

## 2019-08-19 DIAGNOSIS — Z79899 Other long term (current) drug therapy: Secondary | ICD-10-CM | POA: Insufficient documentation

## 2019-08-19 DIAGNOSIS — M1990 Unspecified osteoarthritis, unspecified site: Secondary | ICD-10-CM | POA: Insufficient documentation

## 2019-08-19 DIAGNOSIS — D6869 Other thrombophilia: Secondary | ICD-10-CM | POA: Diagnosis not present

## 2019-08-19 DIAGNOSIS — Z8249 Family history of ischemic heart disease and other diseases of the circulatory system: Secondary | ICD-10-CM | POA: Insufficient documentation

## 2019-08-19 DIAGNOSIS — K219 Gastro-esophageal reflux disease without esophagitis: Secondary | ICD-10-CM | POA: Diagnosis not present

## 2019-08-19 DIAGNOSIS — I4891 Unspecified atrial fibrillation: Secondary | ICD-10-CM | POA: Diagnosis not present

## 2019-08-19 DIAGNOSIS — E785 Hyperlipidemia, unspecified: Secondary | ICD-10-CM | POA: Diagnosis not present

## 2019-08-19 DIAGNOSIS — Z79890 Hormone replacement therapy (postmenopausal): Secondary | ICD-10-CM | POA: Insufficient documentation

## 2019-08-19 DIAGNOSIS — K5900 Constipation, unspecified: Secondary | ICD-10-CM | POA: Insufficient documentation

## 2019-08-19 DIAGNOSIS — Z951 Presence of aortocoronary bypass graft: Secondary | ICD-10-CM | POA: Diagnosis not present

## 2019-08-19 DIAGNOSIS — Z87442 Personal history of urinary calculi: Secondary | ICD-10-CM | POA: Insufficient documentation

## 2019-08-19 LAB — BASIC METABOLIC PANEL
Anion gap: 9 (ref 5–15)
BUN: 18 mg/dL (ref 8–23)
CO2: 33 mmol/L — ABNORMAL HIGH (ref 22–32)
Calcium: 9.3 mg/dL (ref 8.9–10.3)
Chloride: 99 mmol/L (ref 98–111)
Creatinine, Ser: 1.29 mg/dL — ABNORMAL HIGH (ref 0.61–1.24)
GFR calc Af Amer: >60 mL/min (ref >60)
GFR calc non Af Amer: 57 mL/min — ABNORMAL LOW (ref >60)
Glucose, Bld: 88 mg/dL (ref 70–99)
Potassium: 4.2 mmol/L (ref 3.5–5.1)
Sodium: 141 mmol/L (ref 135–145)

## 2019-08-19 LAB — CBC
HCT: 42.2 % (ref 39.0–52.0)
Hemoglobin: 13.2 g/dL (ref 13.0–17.0)
MCH: 27.7 pg (ref 26.0–34.0)
MCHC: 31.3 g/dL (ref 30.0–36.0)
MCV: 88.7 fL (ref 80.0–100.0)
Platelets: 195 10*3/uL (ref 150–400)
RBC: 4.76 MIL/uL (ref 4.22–5.81)
RDW: 13.7 % (ref 11.5–15.5)
WBC: 6.4 10*3/uL (ref 4.0–10.5)
nRBC: 0 % (ref 0.0–0.2)

## 2019-08-19 NOTE — Progress Notes (Signed)
Primary Care Physician: Henry Ghent, MD Referring Physician: Dr. Lovena [name]  Cardiologist: Henry. Marnette [name] Henry [name] is a 69 y.o. male with a h/o bypass and graft, Parkinson's, diabetes and hypertension as well as hyperlipidemia. Carotid Dopplers in January 2005 showed 0-39% stenosis. Last nuclear study 6/15 showed EF 41, prior MI; no ischemia. Echo 8/15 showed EF 30-35, moderate LAE. Cardiac catheterization September 2015 showed a pulmonary Wedge pressure of 16 and PA pressure 32/20. The LAD was occluded. No obstructive disease in the circumflex. The right coronary had severe diffuse distal disease. Saphenous vein graft to the PDA was patent. Saphenous vein graft to diagonal patent. LIMA to the mid LAD patent. Medical therapy recommended. Had ICD placed November 2015 foolwoed by Henry. Lovena [name].   He is being seen in the  afib clinic  today for 8 AT/AF episodes that occurred from 06/17/19 to 07/05/19. EGMs did show episodes of AF with controlled v-rates with the longest episode lasting 1 hour and 6 minutes, AT/AF burden < 0.1%. Optivol has been elevated since mid June.No [place] on board. Meds include  Toprol XL 50 mg daily, Lasix 80 mg in am and 40 mg in the afternoon. Labs 07/21/19 show K+ 3.4 which is up from 3.1 on labs from 3 weeks ago.   In  the afib clinic today, he is in [place]. He really was not aware of any of the episodes.  He has a low BP 2/2 Parkinson's and I do not feel BB can be increased. He denies a bleeding history. He walks poorly and mostly is in a W/C,  no history of repeated falls. He is here with his daughter and wife today. CHA2DS2VASc score of at least 5.   F/u in afib clinic, 8/10, for f/u anticoagulation start 3 weeks ago. No bleeding noted but he reports some itching around his lowere scalp area and neck. Feels better after a shower but then starts itching before his next shower 2 dasy later. The wife states that he started Myrbetriq around the same time as eliquis. No rash,  no  torso, arm itching. Appears to be   in rhythm with AV pacing today.    Today, he denies symptoms of palpitations, chest pain, shortness of breath, orthopnea, PND, lower extremity edema, dizziness, presyncope, syncope, or neurologic sequela. The patient is tolerating medications without difficulties and is otherwise without complaint today.   Past Medical History:  Diagnosis Date  . AICD (automatic cardioverter/defibrillator) present    Henry Henry [name] office visit yearly, MDT   . Arthritis    cane  . Asthma   . Benign neoplasm of colon   . CAD (coronary artery disease)   . Cardiomyopathy   . Cataract   . Complication of anesthesia    pt states that he got a rash  . Constipation   . 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 DM (diabetes mellitus) ([place])    TYPE 2   . GERD (gastroesophageal reflux disease)   . Glaucoma    right eye  . HLD (hyperlipidemia)   . HTN (hypertension)    pt denies 08/19/12  . Hyperplasia, prostate   . Hyperthyroidism    thyroid lobectomy  . MI (myocardial infarction) [facility])    Henry [name] 2000, x3vessels bypass  . Nephrolithiasis   . OSA (obstructive sleep apnea)    AHI-28,on CPAP, noncompliant with CPAP  . Parkinson disease ([place])    1999  . PONV (postoperative  nausea and vomiting)   . Restless legs   . Shortness of breath    Hx: of at all times  . Sleep apnea    does not use CPAP  . Sleep apnea, organic   . UTI (lower urinary tract infection) 09/15/12   Klebsiella  . Ventral hernia    Past Surgical History:  Procedure Laterality Date  . 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 BIOPSY  10/14/2018   Procedure: BIOPSY;  Surgeon: Henry Bears, MD;  Location: [facility];  Service: Gastroenterology;;  . CARDIAC CATHETERIZATION    . CATARACT EXTRACTION W/ INTRAOCULAR LENS IMPLANT     Hx: of right eye  . CATARACT EXTRACTION W/ INTRAOCULAR LENS IMPLANT Left 2018  . COLONOSCOPY N/A 10/13/2014   Procedure: COLONOSCOPY;   Surgeon: Henry Bears, MD;  Location: [facility];  Service: Gastroenterology;  Laterality: N/A;  . COLONOSCOPY W/ BIOPSIES AND POLYPECTOMY     Hx: of  . COLONOSCOPY WITH PROPOFOL N/A 10/14/2018   Procedure: COLONOSCOPY WITH PROPOFOL;  Surgeon: Henry Bears, MD;  Location: [facility];  Service: Gastroenterology;  Laterality: N/A;  . CORONARY ARTERY BYPASS GRAFT  2000   Henry Price, MD  . [place]  . DEEP BRAIN STIMULATOR PLACEMENT  2004   Right and left VIN stimulator placement (parkinsons)  . EYE SURGERY    . FINGER AMPUTATION     left pointer  . IMPLANTABLE CARDIOVERTER DEFIBRILLATOR IMPLANT N/A 11/13/2013   Procedure: IMPLANTABLE CARDIOVERTER DEFIBRILLATOR IMPLANT;  Surgeon: Henry Lance, MD;  Location: [facility] CATH LAB;  Service: Cardiovascular;  Laterality: N/A;  . INSERT / REPLACE / REMOVE PACEMAKER    . LEFT AND RIGHT HEART CATHETERIZATION WITH CORONARY ANGIOGRAM N/A 09/24/2013   Procedure: LEFT AND RIGHT HEART CATHETERIZATION WITH CORONARY ANGIOGRAM;  Surgeon: Henry Blanks, MD;  Location: [facility] CATH LAB;  Service: Cardiovascular;  Laterality: N/A;  . LITHOTRIPSY     3 different times  . MEDIAN STERNOTOMY  2000  . POLYPECTOMY  10/14/2018   Procedure: POLYPECTOMY;  Surgeon: Henry Bears, MD;  Location: [facility];  Service: Gastroenterology;;  . PULSE GENERATOR IMPLANT Right 11/13/2017   Procedure: Right chest implantable pulse generator change;  Surgeon: Henry Levine, MD;  Location: [facility];  Service: Neurosurgery;  Laterality: Right;  Right chest implantable pulse generator change  . SUBTHALAMIC STIMULATOR BATTERY REPLACEMENT N/A 09/05/2012   Procedure: Deep brain stimulator battery change;  Surgeon: Henry Levine, MD;  Location: [facility];  Service: Neurosurgery;  Laterality: N/A;  Deep brain stimulator battery change  . SUBTHALAMIC STIMULATOR BATTERY REPLACEMENT N/A 06/10/2015   Procedure: Deep Brain stimulator battery change;  Surgeon: Henry Levine,  MD;  Location: [facility];  Service: Neurosurgery;  Laterality: N/A;  . TONSILLECTOMY      Current Outpatient Medications  Medication Sig Dispense Refill  . acetaminophen (TYLENOL) 500 MG tablet Take 500 mg by mouth 2 (two) times daily as needed for mild pain or headache.     . albuterol (VENTOLIN HFA) 108 (90 Base) MCG/ACT inhaler Inhale 2 puffs into the lungs every 6 (six) hours as needed for wheezing or shortness of breath. 18 g 5  . allopurinol (ZYLOPRIM) 100 MG tablet TAKE 1 & 1/2 TABLET BY MOUTH DAILY 135 tablet 3  . AMBULATORY NON FORMULARY MEDICATION Ustep walker DX: G20 1 Device 0  . apixaban (ELIQUIS) 5 MG TABS tablet Take 1 tablet (5 mg total) by mouth  2 (two) times daily. 60 tablet 3  . carbidopa-levodopa (SINEMET CR) 50-200 MG tablet Take 1 tablet by mouth at bedtime. 90 tablet 1  . Carbidopa-Levodopa ER (SINEMET CR) 25-100 MG tablet controlled release TAKE 2 TABLETS 3 times a day    . Cholecalciferol (VITAMIN D) 50 MCG (2000 UT) tablet Take 2,000 Units by mouth daily.     . colchicine 0.6 MG tablet Take 1 tablet (0.6 mg total) by mouth 2 (two) times daily as needed (gout). (Patient taking differently: Take 0.6 mg by mouth as needed (gout). ) 30 tablet 1  . Fluticasone-Salmeterol (WIXELA INHUB) 250-50 MCG/DOSE AEPB Inhale 1 puff into the lungs 2 (two) times daily.    Marland Kitchen FREESTYLE LITE test strip USE TO CHECK BLOOD SUGAR 4 TIMES DAILY. 150 strip 3  . furosemide (LASIX) 80 MG tablet TAKE 1 & 1/2 TABLETS BY MOUTH DAILY 135 tablet 0  . gabapentin (NEURONTIN) 100 MG capsule TAKE 2 CAPSULES BY MOUTH EVERY MORNING AND TAKE 4 CAPSULES AT BEDTIME 540 capsule 1  . glucose blood (ONETOUCH VERIO) test strip USE AS DIRECTED TO CHECK BLOOD SUGAR TWICE A DAY. DX:E11.65 100 each 3  . HUMALOG 100 UNIT/ML injection USE MAXIMUM 76 UNITS PER   DAY WITH V-GO PUMP 60 mL 2  . Insulin Disposable Pump (OMNIPOD DASH SYSTEM) KIT 1 each by Does not apply route. Use Dash system for Continuous Blood Glucose  Monitoring.    Marland Kitchen levothyroxine (SYNTHROID) 137 MCG tablet Take 1 tablet (137 mcg total) by mouth daily before breakfast. 90 tablet 1  . metolazone (ZAROXOLYN) 2.5 MG tablet TAKE 1 TABLET BY MOUTH ONCE A WEEK.    . metoprolol tartrate (LOPRESSOR) 50 MG tablet Take 50 mg by mouth daily.    . mirabegron ER (MYRBETRIQ) 25 MG TB24 tablet Take 1 tablet (25 mg total) by mouth daily. 30 tablet 5  . nitroGLYCERIN (NITROSTAT) 0.4 MG SL tablet 0.4 mg as needed.    . NON FORMULARY SMARTSIG:1 Drop(s) Right Eye Every Evening    . omega-3 acid ethyl esters (LOVAZA) 1 g capsule TAKE 2 CAPSULES (2GRAMS TOTAL) BY MOUTH TWICE DAILY. 360 capsule 1  . OVER THE COUNTER MEDICATION Apply 1 application topically at bedtime. Theraworx Pain Cream    . polyethylene glycol (MIRALAX / GLYCOLAX) packet Take 17 g by mouth daily.     . potassium chloride SA (KLOR-CON M20) 20 MEQ tablet Take 3 tablets (60 mEq total) by mouth daily. (Patient taking differently: Taking 2 tablets by mouth in the am and 1 tablet in the pm)    . Propylene Glycol (SYSTANE BALANCE OP) Uses Systane gel apply to the eye as needed    . rosuvastatin (CRESTOR) 20 MG tablet TAKE 1 TABLET BY MOUTH EVERY DAY 90 tablet 3  . Semaglutide, 1 MG/DOSE, (OZEMPIC, 1 MG/DOSE,) 2 MG/1.5ML SOPN Inject 1 mg into the skin once a week. 3 pen 1  . traMADol (ULTRAM) 50 MG tablet TAKE 1 TABLET BY MOUTH TWICE A DAY AS NEEDED FOR MODERATE PAIN 60 tablet 2  . travoprost, benzalkonium, (TRAVATAN) 0.004 % ophthalmic solution Place 1 drop into the right eye at bedtime.     . vitamin B-12 (CYANOCOBALAMIN) 1000 MCG tablet Take 1,000 mcg by mouth daily.     No current facility-administered medications for this encounter.    Allergies  Allergen Reactions  . Penicillins Anaphylaxis and Rash    Because of a history of documented adverse serious drug reaction;Medi Alert bracelet  is recommended  PATIENT HAS HAD A PCN REACTION WITH IMMEDIATE RASH, FACIAL/TONGUE/THROAT SWELLING, SOB, OR  LIGHTHEADEDNESS WITH HYPOTENSION:  #  #  YES  #  #  Has patient had a PCN reaction causing severe rash involving mucus membranes or skin necrosis: unknown Has patient had a PCN reaction that required hospitalization NO Has patient had a PCN reaction occurring within the last 10 years: NO  . Klonopin [Clonazepam] Other (See Comments)    agitation  . Peanut-Containing Drug Products Cough  . Watermelon [Citrullus Vulgaris] Other (See Comments) and Cough    Tickle in throat    Social History   Socioeconomic History  . Marital status: Married    Spouse name: [name]  . Number of children: 2  . Years of education: 5  . Highest education level: High school graduate  Occupational History  . 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 Smoking status: Never Smoker  . Smokeless tobacco: Never Used  Vaping Use  . Vaping Use: Never used  Substance and Sexual Activity  . Alcohol use: Not Currently  . Drug use: No  . Sexual activity: Not Currently  Other Topics Concern  . Not on file  Social History Narrative   From [facility]   Retired/disability Clinical research associate   Likes to fish.     Married 1972   3 kids   [place] fan   Social Determinants of Radio broadcast assistant Strain:   . Difficulty of Paying Living Expenses:   Food Insecurity:   . Worried About Charity fundraiser in the Last Year:   . Arboriculturist in the Last Year:   Transportation Needs:   . Film/video editor (Medical):   Marland Kitchen Lack of Transportation (Non-Medical):   Physical Activity:   . Days of Exercise per Week:   . Minutes of Exercise per Session:   Stress:   . Feeling of Stress :   Social Connections:   . Frequency of Communication with Friends and Family:   . Frequency of Social Gatherings with Friends and Family:   . Attends Religious Services:   . Active Member of Clubs or Organizations:   . Attends Archivist Meetings:   Marland Kitchen Marital Status:     Intimate Partner Violence:   . Fear of Current or Ex-Partner:   . Emotionally Abused:   Marland Kitchen Physically Abused:   . Sexually Abused:     Family History  Problem Relation Age of Onset  . Aneurysm Mother   . Alcoholism Father   . HIV/AIDS Brother 35       AIDS  . Healthy Sister   . Healthy Child   . Peripheral vascular disease Other   . Arthritis Other   . Healthy Sister   . Healthy Child   . Healthy Child   . Diabetes Neg Hx   . Heart disease Neg Hx   . Colon cancer Neg Hx   . Prostate cancer Neg Hx     ROS- All systems are reviewed and negative except as per the HPI above  Physical Exam: Vitals:   08/19/19 1551  BP: 108/70  Pulse: 76  SpO2: 97%  Weight: 111.1 kg  Height: _0  (1.753 m)   Wt Readings from Last 3 Encounters:  08/19/19 111.1 kg  07/23/19 108.7 kg  07/01/19 109.8 kg    Labs: Lab Results  Component Value Date   NA 139 07/21/2019   K 3.4 (  L) 07/21/2019   CL 96 07/21/2019   CO2 35 (H) 07/21/2019   GLUCOSE 108 (H) 07/21/2019   BUN 20 07/21/2019   CREATININE 1.49 07/21/2019   CALCIUM 9.2 07/21/2019   Lab Results  Component Value Date   INR 0.9 09/22/2013   Lab Results  Component Value Date   CHOL 130 04/24/2019   HDL 23.20 (L) 04/24/2019   LDLCALC 118 (H) 09/16/2015   TRIG 297.0 (H) 04/24/2019     GEN- The patient is well appearing, alert and oriented x 3 today.   Head- normocephalic, atraumatic Eyes-  Sclera clear, conjunctiva pink Ears- hearing intact Oropharynx- clear Neck- supple, no JVP Lymph- no cervical lymphadenopathy Lungs- Clear to ausculation bilaterally, normal work of breathing Heart- Regular rate and rhythm, no murmurs, rubs or gallops, PMI not laterally displaced GI- soft, NT, ND, + BS Extremities- no clubbing, cyanosis, or edema MS- no significant deformity or atrophy Skin- no rash or lesion Psych- euthymic mood, full affect Neuro- strength and sensation are intact  EKG- AV paced at 72 bpm, pr int 230 ms, qrs  int 76 ms, qtc 407 ms Device clinic -"1 day transmission received that showed patient  had 8 AT/AF episodes that occurred from 06/17/19 to 07/05/19. EGMs did show episodes of AF with controlled v-rates with the longest episode lasting 1 hour and 6 minutes, AT/AF burden < 0.1%. Optivol has been elevated since mid June.No OAC, , Toprol XL 50 mg daily, Lasix 80 mg in am and 40 mg in the afternoon. Labs 07/21/19 show K+ 3.4 which is up from 3.1 on labs from 3 weeks ago"   Assessment and Plan: 1. New onset afib General education re afib No triggers identified other than episodes were  the same day that his wife had a procedure and he was worried about her   afib low burden so far  Pt asymptomatic or minimally  asymptomatic Cannot increase BB as he has a low BP 2/2 Parkinson's  In AV paced rhythm today   2. CHA2DS2VASc score of 5 Stroke/bleeding risk discussed Denies a bleeding history  Cbc/bmet today  Continue  eliquis 5 mg bid  Off ASA  3. Itching of posterior head and neck  No  rash noted  Not sure if from eliquis or not  If continues can try to switch to another anticoagulate   Will f/u in afib clinic as needed   Butch Penny C. Blaire Hodsdon, [facility] [address] [place],  [address] [phone]

## 2019-08-25 DIAGNOSIS — Z794 Long term (current) use of insulin: Secondary | ICD-10-CM | POA: Diagnosis not present

## 2019-08-25 DIAGNOSIS — H4031X3 Glaucoma secondary to eye trauma, right eye, severe stage: Secondary | ICD-10-CM | POA: Diagnosis not present

## 2019-08-25 DIAGNOSIS — E119 Type 2 diabetes mellitus without complications: Secondary | ICD-10-CM | POA: Diagnosis not present

## 2019-08-25 DIAGNOSIS — H04123 Dry eye syndrome of bilateral lacrimal glands: Secondary | ICD-10-CM | POA: Diagnosis not present

## 2019-08-25 DIAGNOSIS — H40032 Anatomical narrow angle, left eye: Secondary | ICD-10-CM | POA: Diagnosis not present

## 2019-08-26 DIAGNOSIS — D631 Anemia in chronic kidney disease: Secondary | ICD-10-CM | POA: Diagnosis not present

## 2019-08-26 DIAGNOSIS — N2581 Secondary hyperparathyroidism of renal origin: Secondary | ICD-10-CM | POA: Diagnosis not present

## 2019-08-26 DIAGNOSIS — G20 Parkinson's disease: Secondary | ICD-10-CM | POA: Diagnosis not present

## 2019-08-26 DIAGNOSIS — N1832 Chronic kidney disease, stage 3b: Secondary | ICD-10-CM | POA: Diagnosis not present

## 2019-08-26 DIAGNOSIS — I509 Heart failure, unspecified: Secondary | ICD-10-CM | POA: Diagnosis not present

## 2019-08-26 DIAGNOSIS — I129 Hypertensive chronic kidney disease with stage 1 through stage 4 chronic kidney disease, or unspecified chronic kidney disease: Secondary | ICD-10-CM | POA: Diagnosis not present

## 2019-08-29 ENCOUNTER — Ambulatory Visit (INDEPENDENT_AMBULATORY_CARE_PROVIDER_SITE_OTHER): Payer: Medicare Other | Admitting: Family Medicine

## 2019-08-29 ENCOUNTER — Encounter: Payer: Self-pay | Admitting: Family Medicine

## 2019-08-29 ENCOUNTER — Other Ambulatory Visit: Payer: Self-pay

## 2019-08-29 DIAGNOSIS — I255 Ischemic cardiomyopathy: Secondary | ICD-10-CM | POA: Diagnosis not present

## 2019-08-29 DIAGNOSIS — L989 Disorder of the skin and subcutaneous tissue, unspecified: Secondary | ICD-10-CM

## 2019-08-29 MED ORDER — GABAPENTIN 100 MG PO CAPS: ORAL_CAPSULE | ORAL | 1 refills | Status: DC

## 2019-08-29 NOTE — Progress Notes (Signed)
This visit occurred during the SARS-CoV-2 public health emergency.  Safety protocols were in place, including screening questions prior to the visit, additional usage of staff PPE, and extensive cleaning of exam room while observing appropriate contact time as indicated for disinfecting solutions.  We talked about trying to taper gabapentin slowly.  rx sent in the meantime.  Prev use for foot pain with relief.    Cr has improved and his sugar has been better recently.    Sore on the abd wall.  Going on for about 6 weeks.  Itches, gets irritated.  Some drainage.  No FCNAVD.  No spreading redness.    Meds, vitals, and allergies reviewed.   ROS: Per HPI unless specifically indicated in ROS section   nad ncat In wheelchair at baseline. rrr ctab Abdomen soft, not tender.  He does have a small area about 1 cm across with superficial irritation without a fluctuant mass.  There is no undermining or deep ulceration.  No spreading erythema.  The area is located on the lower anterior abdominal wall where the skin is pressed together from his pannus, close to his waistband.  Scant amount of discharge noted.

## 2019-08-29 NOTE — Patient Instructions (Signed)
Use quilted gauze to try to keep it dry.  Use coban around your waist if needed to keep it in place.   Update me as needed.  It doesn't appear that you need antibiotics at this point.  Take care.  Glad to see you.

## 2019-08-31 NOTE — Assessment & Plan Note (Signed)
Does not appear to need oral antibiotics at this point.  I think it is just having trouble healing over because he gets macerated with skin apposition.  Keeping it dry should help.  We talked about using quilted gauze over the area.  He can use Coban around this if needed to keep it in place.  They will update me as needed.  I still expect this to heal.

## 2019-09-02 ENCOUNTER — Ambulatory Visit: Payer: Medicare Other | Admitting: Endocrinology

## 2019-09-08 ENCOUNTER — Ambulatory Visit (INDEPENDENT_AMBULATORY_CARE_PROVIDER_SITE_OTHER): Payer: Medicare Other | Admitting: Endocrinology

## 2019-09-08 ENCOUNTER — Other Ambulatory Visit: Payer: Self-pay

## 2019-09-08 ENCOUNTER — Encounter: Payer: Self-pay | Admitting: Neurology

## 2019-09-08 ENCOUNTER — Other Ambulatory Visit: Payer: Self-pay | Admitting: Neurosurgery

## 2019-09-08 ENCOUNTER — Other Ambulatory Visit: Payer: Self-pay | Admitting: Family Medicine

## 2019-09-08 ENCOUNTER — Ambulatory Visit (INDEPENDENT_AMBULATORY_CARE_PROVIDER_SITE_OTHER): Payer: Medicare Other | Admitting: Neurology

## 2019-09-08 ENCOUNTER — Encounter: Payer: Self-pay | Admitting: Endocrinology

## 2019-09-08 VITALS — BP 130/62 | HR 74 | Ht 69.0 in | Wt 241.0 lb

## 2019-09-08 VITALS — BP 115/72 | HR 76 | Ht 69.0 in | Wt 241.0 lb

## 2019-09-08 DIAGNOSIS — G20 Parkinson's disease: Secondary | ICD-10-CM | POA: Diagnosis not present

## 2019-09-08 DIAGNOSIS — Z794 Long term (current) use of insulin: Secondary | ICD-10-CM

## 2019-09-08 DIAGNOSIS — I480 Paroxysmal atrial fibrillation: Secondary | ICD-10-CM | POA: Diagnosis not present

## 2019-09-08 DIAGNOSIS — I255 Ischemic cardiomyopathy: Secondary | ICD-10-CM

## 2019-09-08 DIAGNOSIS — E1165 Type 2 diabetes mellitus with hyperglycemia: Secondary | ICD-10-CM | POA: Diagnosis not present

## 2019-09-08 DIAGNOSIS — E1142 Type 2 diabetes mellitus with diabetic polyneuropathy: Secondary | ICD-10-CM

## 2019-09-08 LAB — POCT GLYCOSYLATED HEMOGLOBIN (HGB A1C): Hemoglobin A1C: 5.8 % — AB (ref 4.0–5.6)

## 2019-09-08 NOTE — Procedures (Signed)
DBS Programming was performed.    Manufacturer of DBS device: [employer]tronic  Total time spent programming was 10 minutes.  Device was confirmed to be on.  Soft start was confirmed to be on.  Impedences were checked and were within normal limits.  Battery was checked and was determined to be functioning normally and near the end of life.  Final settings were as follows:   Active Contact Amplitude (V) PW (ms) Frequency (hz) Side Effects Battery  Left Brain        09/08/19 1-2+ 4.4 90 170  2.59                          Right Brain        09/08/19 4-7+ 4.5 90 170

## 2019-09-08 NOTE — Progress Notes (Signed)
Assessment/Plan:   1.  Parkinsons Disease  -Status post bilateral DBS STN surgery  -Battery changes occurring September 05, 2012; June 10, 2015; November 13, 2017; patient's battery is end-of-life today. Contacted Dr. Donald Pore office and they will do it in 3 weeks  -Needs walker at all times. Discussed with him that he needs to have somebody with him at all times where he needs to stay seated in a wheelchair, esp since dx with a-fib since last visit and now on eliquis  -Take carbidopa/levodopa 25/100 CR, 2 tablets at 7 AM/2 tablets at 11 AM/2 tablets at 3 PM/1 tablet at 7 PM  -Take carbidopa/levodopa 50/200 CR at bedtime 2. RBD -He has tried clonazepam several times and ultimately ends up stopping it because of side effect. Could try Rozerem in the future if insurance would pay. Decided to hold off for now as the patient states that while dreaming is vivid, it is not particularly bothersome and he is not falling out of the bed.Discussed bedroom safety. 3. Pseudobulbar affect (crying) -Wants no medication for that. 4. OSAS, noncompliant with CPAP. -Unfortunately, he has been unable to tolerate CPAP. Talked about dental appliances, which he does not wish to try either. 5. CHF -ICD placed on 11/15/2013 6. Facial paresthesias -Has been going on for about a year. He has had several negative noncontrast CTs of the brain. Kidney function is not good enough for contrasted CT. His DBS device is not MRI compatible. -In the past, and wondered if this was not trigeminal neuralgia, but today he was describing it as full face, as if he was wearing a ski mask.  I am unsure what this is, and offered to try carbamazepine, but he really did not want that. -I have turned off his DBS and he noticed no change in the facial paresthesias. 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 We talked about  treatments. The patient is not a candidate for oral anticholinergic therapy because of increased risk of confusion and falls. We discussed Botox (type A and B) and 1% atropine drops. We discusssed that candy like lemon drops can help by stimulating mm of the oropharynx to induce swallowing. He does not want any treatment right now. 8.Day/night reversal -Long discussion with the patient regarding the importance of staying awake during the day and sleeping at night. Discussed that this can ultimately cause confusion and hallucinations. 9.  ? Myoclonus             -wonder if from gabapentin, especially given that his kidney function is increasing somewhat.  he didn't want to try going off 10. A-fib  -Now on Eliquis.  Discussed that if he should fall and hit head, he should go to the emergency room for evaluation.  More importantly, I told him again that he should stay seated because of fall risk.  -Discussed with the patient that when he goes to the cardiologist, he needs to take his remote and turn off the DBS for them.  Subjective:   Henry [name] was seen today in follow up for Parkinsons disease.  My previous records were reviewed prior to todays visit as well as outside records available to me. Pt worked in today.  He started shaking uncontrollably yesterday.  Thought battery dead.  Daughter came over to the house and turns out that his device was off, but once it turned back on, they noticed the ERI symbol.  Upon questioning today, it turns out that he was diagnosed with  A. fib since our last visit and needed an EKG.  They remember the EKG tech telling them that she had a hard time reading the EKG because of the DBS.  They do remember her putting something over the chest to try to clear up the EKG.  This was on August 10.  There a member tremor getting a little worse after that, but much worse yesterday.  Current prescribed movement disorder medications: Carbidopa/levodopa  25/100 CR, 2 tablets at 7 AM/11 AM/3 PM/1 tablet at 7 PM Carbidopa/levodopa 50/200 CR at bedtime (added last time at bedtime)    ALLERGIES:   Allergies  Allergen Reactions  . Penicillins Anaphylaxis and Rash    Because of a history of documented adverse serious drug reaction;Medi Alert bracelet  is recommended PATIENT HAS HAD A PCN REACTION WITH IMMEDIATE RASH, FACIAL/TONGUE/THROAT SWELLING, SOB, OR LIGHTHEADEDNESS WITH HYPOTENSION:  #  #  YES  #  #  Has patient had a PCN reaction causing severe rash involving mucus membranes or skin necrosis: unknown Has patient had a PCN reaction that required hospitalization NO Has patient had a PCN reaction occurring within the last 10 years: NO  . Klonopin [Clonazepam] Other (See Comments)    agitation  . Peanut-Containing Drug Products Cough  . Watermelon [Citrullus Vulgaris] Other (See Comments) and Cough    Tickle in throat    CURRENT MEDICATIONS:  Outpatient Encounter Medications as of 09/08/2019  Medication Sig  . acetaminophen (TYLENOL) 500 MG tablet Take 500 mg by mouth 2 (two) times daily as needed for mild pain or headache.   . albuterol (VENTOLIN HFA) 108 (90 Base) MCG/ACT inhaler Inhale 2 puffs into the lungs every 6 (six) hours as needed for wheezing or shortness of breath.  . allopurinol (ZYLOPRIM) 100 MG tablet TAKE 1 & 1/2 TABLET BY MOUTH DAILY  . AMBULATORY NON FORMULARY MEDICATION Ustep walker DX: G20  . apixaban (ELIQUIS) 5 MG TABS tablet Take 1 tablet (5 mg total) by mouth 2 (two) times daily.  . carbidopa-levodopa (SINEMET CR) 50-200 MG tablet Take 1 tablet by mouth at bedtime.  . Carbidopa-Levodopa ER (SINEMET CR) 25-100 MG tablet controlled release TAKE 2 TABLETS 3 times a day  . Cholecalciferol (VITAMIN D) 50 MCG (2000 UT) tablet Take 2,000 Units by mouth daily.   . colchicine 0.6 MG tablet Take 1 tablet (0.6 mg total) by mouth 2 (two) times daily as needed (gout). (Patient taking differently: Take 0.6 mg by mouth as needed  (gout). )  . Fluticasone-Salmeterol (WIXELA INHUB) 250-50 MCG/DOSE AEPB Inhale 1 puff into the lungs 2 (two) times daily.  Marland Kitchen FREESTYLE LITE test strip USE TO CHECK BLOOD SUGAR 4 TIMES DAILY.  . furosemide (LASIX) 80 MG tablet TAKE 1 & 1/2 TABLETS BY MOUTH DAILY  . gabapentin (NEURONTIN) 100 MG capsule 2 tabs in the AM and 4 in the PM.  . glucose blood (ONETOUCH VERIO) test strip USE AS DIRECTED TO CHECK BLOOD SUGAR TWICE A DAY. DX:E11.65  . HUMALOG 100 UNIT/ML injection USE MAXIMUM 76 UNITS PER   DAY WITH V-GO PUMP  . Insulin Disposable Pump (OMNIPOD DASH SYSTEM) KIT 1 each by Does not apply route. Use Dash system for Continuous Blood Glucose Monitoring.  Marland Kitchen levothyroxine (SYNTHROID) 137 MCG tablet Take 1 tablet (137 mcg total) by mouth daily before breakfast.  . metolazone (ZAROXOLYN) 2.5 MG tablet TAKE 1 TABLET BY MOUTH ONCE A WEEK.  . metoprolol tartrate (LOPRESSOR) 50 MG tablet Take 50 mg by mouth daily.  Marland Kitchen  mirabegron ER (MYRBETRIQ) 25 MG TB24 tablet Take 1 tablet (25 mg total) by mouth daily.  . nitroGLYCERIN (NITROSTAT) 0.4 MG SL tablet 0.4 mg as needed.  Marland Kitchen omega-3 acid ethyl esters (LOVAZA) 1 g capsule TAKE 2 CAPSULES (2GRAMS TOTAL) BY MOUTH TWICE DAILY.  Marland Kitchen OVER THE COUNTER MEDICATION Apply 1 application topically at bedtime. Theraworx Pain Cream  . polyethylene glycol (MIRALAX / GLYCOLAX) packet Take 17 g by mouth daily.   . potassium chloride SA (KLOR-CON M20) 20 MEQ tablet Take 3 tablets (60 mEq total) by mouth daily.  Marland Kitchen Propylene Glycol (SYSTANE BALANCE OP) Uses Systane gel apply to the eye as needed  . rosuvastatin (CRESTOR) 20 MG tablet TAKE 1 TABLET BY MOUTH EVERY DAY  . Semaglutide, 1 MG/DOSE, (OZEMPIC, 1 MG/DOSE,) 2 MG/1.5ML SOPN Inject 1 mg into the skin once a week.  . traMADol (ULTRAM) 50 MG tablet TAKE 1 TABLET BY MOUTH TWICE A DAY AS NEEDED FOR MODERATE PAIN  . travoprost, benzalkonium, (TRAVATAN) 0.004 % ophthalmic solution Place 1 drop into the right eye at bedtime.   .  vitamin B-12 (CYANOCOBALAMIN) 1000 MCG tablet Take 1,000 mcg by mouth daily.  . [DISCONTINUED] NON FORMULARY SMARTSIG:1 Drop(s) Right Eye Every Evening (Patient not taking: Reported on 09/08/2019)   No facility-administered encounter medications on file as of 09/08/2019.    Objective:   PHYSICAL EXAMINATION:    VITALS:   Vitals:   09/08/19 0914  BP: 115/72  Pulse: 76  SpO2: 98%  Weight: 241 lb (109.3 kg)  Height: $Remove'5\' 9"'ARWmtYc$  (1.753 m)    GEN:  The patient appears stated age and is in NAD. HEENT:  Normocephalic, atraumatic.  The mucous membranes are moist. The superficial temporal arteries are without ropiness or tenderness. CV:  RRR Lungs:  CTAB Neck/HEME:  There are no carotid bruits bilaterally.  Neurological examination:  Orientation: The patient is alert and oriented x3. Cranial nerves: There is good facial symmetry with facial hypomimia. The speech is fluent and dysarthric - but better than in the past. Soft palate rises symmetrically and there is no tongue deviation. Hearing is intact to conversational tone. Sensation: Sensation is intact to light touch throughout Motor: Strength is at least antigravity x4.  Movement examination: Tone: There is normal tone in the UE/LE Abnormal movements: none Coordination:  There is  decremation with RAM's, mostly with foot taps on the L Gait and Station: not tested   I have reviewed and interpreted the following labs independently    Chemistry      Component Value Date/Time   NA 141 08/19/2019 1633   NA 142 12/22/2016 1233   K 4.2 08/19/2019 1633   CL 99 08/19/2019 1633   CO2 33 (H) 08/19/2019 1633   BUN 18 08/19/2019 1633   BUN 18 07/01/2018 0000   CREATININE 1.29 (H) 08/19/2019 1633   CREATININE 2.13 06/25/2017 0000   CREATININE 1.53 (H) 05/22/2016 1106      Component Value Date/Time   CALCIUM 9.3 08/19/2019 1633   ALKPHOS 104 08/16/2018 0916   AST 8 08/16/2018 0916   ALT 8 08/16/2018 0916   BILITOT 0.5 08/16/2018 0916        Lab Results  Component Value Date   WBC 6.4 08/19/2019   HGB 13.2 08/19/2019   HCT 42.2 08/19/2019   MCV 88.7 08/19/2019   PLT 195 08/19/2019    Lab Results  Component Value Date   TSH 0.79 07/01/2019     Total time spent on today's  visit was 30 minutes, including both face-to-face time and nonface-to-face time.  Time included that spent on review of records (prior notes available to me/labs/imaging if pertinent), discussing treatment and goals, answering patient's questions and coordinating care.  This did not include DBS time.  Cc:  Tonia Ghent, MD

## 2019-09-08 NOTE — Progress Notes (Signed)
Patient ID: Henry [name], male   DOB: 14-Mar-1950, 69 y.o.   MRN: [id number]           Reason for Appointment: Follow-up for Type 2 Diabetes   History of Present Illness:          Diagnosis: Type 2 diabetes mellitus, date of diagnosis: 2000      Past history:   Patient thinks he has been taking Amaryl for several years and probably metformin since onset also At some point he was changed from metformin to [facility] and was taking this since at least 2012 A1c had been higher in 2015 Since his A1c had been progressively higher with his regimen of Janumet and Amaryl he was started on Victoza in 01/2014 He was started on Lantus insulin in 5/16 because of persistent hyperglycemia especially fasting; was having readings as high as 293 Because of tendency to high postprandial readings and for control with Lantus he was switched to the V-go pump in  09/2014  Recent history:    INSULIN regimen: Omnipod insulin pump with Humalog insulin  Basal rate at midnight will be 1.9, 7 AM 2.4 and 11 AM 3.0,   Basal rate programs: Midnight = 1.9, 7 AM = 2.4, 11 a.m. = 3.0, total basal 59 units  Boluses 5.5 units for breakfast, 4-6 lunch and 6-9 units for dinner  Non-insulin hypoglycemic drugs the patient is taking are: Ozempic 1 mg weekly  His A1c has been consistently over 8% previously Now it is 5.8   Current management, blood sugar patterns and problems identified:  He again had a higher basal rate programmed since his last visit when his blood sugars are averaging 258  His wife states that with this basal rate change his blood sugar is improved significantly  However he appears to have had improvement in his diet also with cutting back on starchy foods, snacks, desserts especially late in the evening  Blood sugars are fairly close to normal throughout the day although occasionally still higher in the morning  His wife is helping him with his management as before and checking blood sugars  mostly before meals  Again difficult to know if his postprandial sugars are consistently controlled especially in the evening  His wife is adjusting his carbohydrates based on his diet especially in the morning when he may either eat only one toast with egg or sometimes waffles.  No hypoglycemia reported  Has been fairly consistent with doing his boluses although still appears that he may be occasionally missing some boluses  Trying to bolus when starting to eat  Has been regular with his Ozempic  His weight is about the same  Hypoglycemia:   none  He has seen the dietitian in 04/2017  Not able to exercise because of back pain and difficulty walking       Side effects from medications have been: None Compliance with the medical regimen: Fair   Glucose monitoring:  Recently about 2 times  a day         Glucometer:  Freestyle with the OmniPod PDM       Blood Glucose readings by download of pump    PRE-MEAL Fasting Lunch Dinner Bedtime Overall  Glucose range:  98-153   95-158   90-158  Mean/median:  130  121  129   126   POST-MEAL PC Breakfast PC Lunch PC Dinner  Glucose range:    149  Mean/median:      Previously:  PRE-MEAL Fasting Lunch  Dinner Bedtime Overall  Glucose range:  141-308  80-315  328    Mean/median:  238     258   POST-MEAL PC Breakfast PC Lunch PC Dinner  Glucose range:    284-386  Mean/median:        Meals: 9 am , 2-3 pm and 6-7 pm            Dietician visit, most recent: 4/19               Weight history:   Wt Readings from Last 3 Encounters:  09/08/19 241 lb (109.3 kg)  09/08/19 241 lb (109.3 kg)  08/29/19 246 lb 5 oz (111.7 kg)    Glycemic control:   Lab Results  Component Value Date   HGBA1C 5.8 (A) 09/08/2019   HGBA1C 8.6 (H) 04/24/2019   HGBA1C 8.2 (A) 02/05/2019   Lab Results  Component Value Date   MICROALBUR <0.7 06/14/2018   LDLCALC 118 (H) 09/16/2015   CREATININE 1.29 (H) 08/19/2019    Lab Results  Component Value  Date   FRUCTOSAMINE 359 (H) 07/01/2019   FRUCTOSAMINE 277 11/09/2017   FRUCTOSAMINE 350 (H) 03/29/2017     Other active problems: See review of systems      Allergies as of 09/08/2019      Reactions   Penicillins Anaphylaxis, Rash   Because of a history of documented adverse serious drug reaction;Medi Alert bracelet  is recommended PATIENT HAS HAD A PCN REACTION WITH IMMEDIATE RASH, FACIAL/TONGUE/THROAT SWELLING, SOB, OR LIGHTHEADEDNESS WITH HYPOTENSION:  #  #  YES  #  #  Has patient had a PCN reaction causing severe rash involving mucus membranes or skin necrosis: unknown Has patient had a PCN reaction that required hospitalization NO Has patient had a PCN reaction occurring within the last 10 years: NO   Klonopin [clonazepam] Other (See Comments)   agitation   Peanut-containing Drug Products Cough   Watermelon [citrullus Vulgaris] Other (See Comments), Cough   Tickle in throat      Medication List       Accurate as of September 08, 2019 11:43 AM. If you have any questions, ask your nurse or doctor.        STOP taking these medications   NON FORMULARY Stopped by: Wells Guiles Tat, DO     TAKE these medications   acetaminophen 500 MG tablet Commonly known as: TYLENOL Take 500 mg by mouth 2 (two) times daily as needed for mild pain or headache.   albuterol 108 (90 Base) MCG/ACT inhaler Commonly known as: VENTOLIN HFA Inhale 2 puffs into the lungs every 6 (six) hours as needed for wheezing or shortness of breath.   allopurinol 100 MG tablet Commonly known as: ZYLOPRIM TAKE 1 & 1/2 TABLET BY MOUTH DAILY   AMBULATORY NON FORMULARY MEDICATION Ustep walker DX: G20   apixaban 5 MG Tabs tablet Commonly known as: Eliquis Take 1 tablet (5 mg total) by mouth 2 (two) times daily.   carbidopa-levodopa 50-200 MG tablet Commonly known as: SINEMET CR Take 1 tablet by mouth at bedtime.   Carbidopa-Levodopa ER 25-100 MG tablet controlled release Commonly known as: SINEMET  CR TAKE 2 TABLETS 3 times a day   colchicine 0.6 MG tablet Take 1 tablet (0.6 mg total) by mouth 2 (two) times daily as needed (gout). What changed: when to take this   furosemide 80 MG tablet Commonly known as: LASIX TAKE 1 & 1/2 TABLETS BY MOUTH DAILY   gabapentin 100 MG  capsule Commonly known as: NEURONTIN 2 tabs in the AM and 4 in the PM.   glucose blood test strip Commonly known as: OneTouch Verio USE AS DIRECTED TO CHECK BLOOD SUGAR TWICE A DAY. DX:E11.65   FREESTYLE LITE test strip Generic drug: glucose blood USE TO CHECK BLOOD SUGAR 4 TIMES DAILY.   HumaLOG 100 UNIT/ML injection Generic drug: insulin lispro USE MAXIMUM 76 UNITS PER   DAY WITH V-GO PUMP   levothyroxine 137 MCG tablet Commonly known as: SYNTHROID Take 1 tablet (137 mcg total) by mouth daily before breakfast.   metolazone 2.5 MG tablet Commonly known as: ZAROXOLYN TAKE 1 TABLET BY MOUTH ONCE A WEEK.   metoprolol tartrate 50 MG tablet Commonly known as: LOPRESSOR Take 50 mg by mouth daily.   mirabegron ER 25 MG Tb24 tablet Commonly known as: Myrbetriq Take 1 tablet (25 mg total) by mouth daily.   nitroGLYCERIN 0.4 MG SL tablet Commonly known as: NITROSTAT 0.4 mg as needed.   omega-3 acid ethyl esters 1 g capsule Commonly known as: LOVAZA TAKE 2 CAPSULES (2GRAMS TOTAL) BY MOUTH TWICE DAILY.   OmniPod Dash System Kit 1 each by Does not apply route. Use Dash system for Continuous Blood Glucose Monitoring.   OVER THE COUNTER MEDICATION Apply 1 application topically at bedtime. Theraworx Pain Cream   Ozempic (1 MG/DOSE) 2 MG/1.5ML Sopn Generic drug: Semaglutide (1 MG/DOSE) Inject 1 mg into the skin once a week.   polyethylene glycol 17 g packet Commonly known as: MIRALAX / GLYCOLAX Take 17 g by mouth daily.   potassium chloride SA 20 MEQ tablet Commonly known as: Klor-Con M20 Take 3 tablets (60 mEq total) by mouth daily.   rosuvastatin 20 MG tablet Commonly known as: CRESTOR TAKE  1 TABLET BY MOUTH EVERY DAY   SYSTANE BALANCE OP Uses Systane gel apply to the eye as needed   traMADol 50 MG tablet Commonly known as: ULTRAM TAKE 1 TABLET BY MOUTH TWICE A DAY AS NEEDED FOR MODERATE PAIN   travoprost (benzalkonium) 0.004 % ophthalmic solution Commonly known as: TRAVATAN Place 1 drop into the right eye at bedtime.   vitamin B-12 1000 MCG tablet Commonly known as: CYANOCOBALAMIN Take 1,000 mcg by mouth daily.   Vitamin D 50 MCG (2000 UT) tablet Take 2,000 Units by mouth daily.   Wixela Inhub 250-50 MCG/DOSE Aepb Generic drug: Fluticasone-Salmeterol Inhale 1 puff into the lungs 2 (two) times daily.       Allergies:  Allergies  Allergen Reactions  . Penicillins Anaphylaxis and Rash    Because of a history of documented adverse serious drug reaction;Medi Alert bracelet  is recommended PATIENT HAS HAD A PCN REACTION WITH IMMEDIATE RASH, FACIAL/TONGUE/THROAT SWELLING, SOB, OR LIGHTHEADEDNESS WITH HYPOTENSION:  #  #  YES  #  #  Has patient had a PCN reaction causing severe rash involving mucus membranes or skin necrosis: unknown Has patient had a PCN reaction that required hospitalization NO Has patient had a PCN reaction occurring within the last 10 years: NO  . Klonopin [Clonazepam] Other (See Comments)    agitation  . Peanut-Containing Drug Products Cough  . Watermelon [Citrullus Vulgaris] Other (See Comments) and Cough    Tickle in throat    Past Medical History:  Diagnosis Date  . AICD (automatic cardioverter/defibrillator) present    Dr Lovena Le office visit yearly, MDT   . Arthritis    cane  . Asthma   . Benign neoplasm of colon   . CAD (coronary artery  disease)   . Cardiomyopathy   . Cataract   . Complication of anesthesia    pt states that he got a rash  . Constipation   . 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 DM (diabetes mellitus) ([place])    TYPE 2   . GERD (gastroesophageal reflux disease)   . Glaucoma    right  eye  . HLD (hyperlipidemia)   . HTN (hypertension)    pt denies 08/19/12  . Hyperplasia, prostate   . Hyperthyroidism    thyroid lobectomy  . MI (myocardial infarction) [facility])    Dr Stanford Breed 2000, x3vessels bypass  . Nephrolithiasis   . OSA (obstructive sleep apnea)    AHI-28,on CPAP, noncompliant with CPAP  . Parkinson disease ([place])    1999  . PONV (postoperative nausea and vomiting)   . Restless legs   . Shortness of breath    Hx: of at all times  . Sleep apnea    does not use CPAP  . Sleep apnea, organic   . UTI (lower urinary tract infection) 09/15/12   Klebsiella  . Ventral hernia     Past Surgical History:  Procedure Laterality Date  . 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 BIOPSY  10/14/2018   Procedure: BIOPSY;  Surgeon: Jerene Bears, MD;  Location: [facility];  Service: Gastroenterology;;  . CARDIAC CATHETERIZATION    . CATARACT EXTRACTION W/ INTRAOCULAR LENS IMPLANT     Hx: of right eye  . CATARACT EXTRACTION W/ INTRAOCULAR LENS IMPLANT Left 2018  . COLONOSCOPY N/A 10/13/2014   Procedure: COLONOSCOPY;  Surgeon: Jerene Bears, MD;  Location: [facility];  Service: Gastroenterology;  Laterality: N/A;  . COLONOSCOPY W/ BIOPSIES AND POLYPECTOMY     Hx: of  . COLONOSCOPY WITH PROPOFOL N/A 10/14/2018   Procedure: COLONOSCOPY WITH PROPOFOL;  Surgeon: Jerene Bears, MD;  Location: [facility];  Service: Gastroenterology;  Laterality: N/A;  . CORONARY ARTERY BYPASS GRAFT  2000   [name], MD  . [place]  . DEEP BRAIN STIMULATOR PLACEMENT  2004   Right and left VIN stimulator placement (parkinsons)  . EYE SURGERY    . FINGER AMPUTATION     left pointer  . IMPLANTABLE CARDIOVERTER DEFIBRILLATOR IMPLANT N/A 11/13/2013   Procedure: IMPLANTABLE CARDIOVERTER DEFIBRILLATOR IMPLANT;  Surgeon: Evans Lance, MD;  Location: [facility] C[facility] LAB;  Service: Cardiovascular;  Laterality: N/A;  . INSERT / REPLACE / REMOVE PACEMAKER    . LEFT AND  RIGHT HEART CATHETERIZATION WITH CORONARY ANGIOGRAM N/A 09/24/2013   Procedure: LEFT AND RIGHT HEART CATHETERIZATION WITH CORONARY ANGIOGRAM;  Surgeon: Burnell Blanks, MD;  Location: [facility] C[facility] LAB;  Service: Cardiovascular;  Laterality: N/A;  . LITHOTRIPSY     3 different times  . MEDIAN STERNOTOMY  2000  . POLYPECTOMY  10/14/2018   Procedure: POLYPECTOMY;  Surgeon: Jerene Bears, MD;  Location: [facility];  Service: Gastroenterology;;  . PULSE GENERATOR IMPLANT Right 11/13/2017   Procedure: Right chest implantable pulse generator change;  Surgeon: Erline Levine, MD;  Location: [place];  Service: Neurosurgery;  Laterality: Right;  Right chest implantable pulse generator change  . SUBTHALAMIC STIMULATOR BATTERY REPLACEMENT N/A 09/05/2012   Procedure: Deep brain stimulator battery change;  Surgeon: Erline Levine, MD;  Location: [place] [facility];  Service: Neurosurgery;  Laterality: N/A;  Deep brain stimulator battery change  . SUBTHALAMIC STIMULATOR BATTERY REPLACEMENT N/A 06/10/2015  Procedure: Deep Brain stimulator battery change;  Surgeon: Erline Levine, MD;  Location: [place] NEURO ORS;  Service: Neurosurgery;  Laterality: N/A;  . TONSILLECTOMY      Family History  Problem Relation Age of Onset  . Aneurysm Mother   . Alcoholism Father   . HIV/AIDS Brother 31       AIDS  . Healthy Sister   . Healthy Child   . Peripheral vascular disease Other   . Arthritis Other   . Healthy Sister   . Healthy Child   . Healthy Child   . Diabetes Neg Hx   . Heart disease Neg Hx   . Colon cancer Neg Hx   . Prostate cancer Neg Hx     Social History:  reports that he has never smoked. He has never used smokeless tobacco. He reports previous alcohol use. He reports that he does not use drugs.    Review of Systems      HYPERLIPIDEMIA:  He is on Crestor 20 mg with good control of LDL, has low HDL Triglycerides are persistently over 200, treated with Lovaza 2 g twice a day       Lab Results  Component  Value Date   CHOL 130 04/24/2019   CHOL 116 09/18/2018   CHOL 130 03/12/2018   Lab Results  Component Value Date   HDL 23.20 (L) 04/24/2019   HDL 25.80 (L) 09/18/2018   HDL 29.00 (L) 03/12/2018   Lab Results  Component Value Date   LDLCALC 118 (H) 09/16/2015   LDLCALC 126 (H) 04/08/2013   LDLCALC 51 02/28/2012   Lab Results  Component Value Date   TRIG 297.0 (H) 04/24/2019   TRIG 270.0 (H) 09/18/2018   TRIG 257.0 (H) 03/12/2018   Lab Results  Component Value Date   CHOLHDL 6 04/24/2019   CHOLHDL 4 09/18/2018   CHOLHDL 4 03/12/2018   Lab Results  Component Value Date   LDLDIRECT 51.0 04/24/2019   LDLDIRECT 54.0 09/18/2018   LDLDIRECT 67.0 03/12/2018                  Thyroid:   He has had hypothyroidism following radioactive iodine treatment for hyperthyroidism in 2013   TSH is improved with now taking 137 mcg levothyroxine   Lab Results  Component Value Date   TSH 0.79 07/01/2019   TSH 5.40 (H) 04/24/2019   TSH 2.64 08/16/2018   FREET4 0.77 04/24/2019   FREET4 0.88 08/16/2018   FREET4 0.75 03/12/2018   Has chronic systolic CHF No recent problems with edema Blood pressure history:  BP Readings from Last 3 Encounters:  09/08/19 130/62  09/08/19 115/72  08/29/19 116/68     CKD: Has variable creatinine levels as follows: Regularly followed by nephrologist  Lab Results  Component Value Date   CREATININE 1.29 (H) 08/19/2019   CREATININE 1.49 07/21/2019   CREATININE 1.69 (H) 07/01/2019    Neuropathy: Taking gabapentin 100 mg from his PCP. Has history of numbness in his toes  Diabetic foot exam in 08/2018  Last eye exam 11/20, negative  Physical Examination:  BP 130/62 (BP Location: Left Arm, Patient Position: Sitting, Cuff Size: Large)   Pulse 74   Ht 5' 9" (1.753 m)   Wt 241 lb (109.3 kg)   BMI 35.59 kg/m      ASSESSMENT/PLAN  Diabetes type 2, insulin-dependent with obesity     See history of present illness for detailed discussion  of his current management, blood sugar patterns and  problems identified  His A1c is the best he has had at 5.8 compared to 8.6 This is unlikely to be affected by his RBC count or creatinine which are near normal  He is using the Omni pod insulin pump with the older PDM and also Ozempic 1 mg weekly   He has had dramatic improvement in his blood sugar control  Although this may be partly related to increase in his basal rates he has had significant improvement in his diet overall and his wife is able to have him control his snacks  Despite blood sugars being on an average about 50% of previous levels he has not gained any weight  Monitoring blood sugars has been improved although he is dependent on his wife to do it  Today discussed the importance and convenience as well as utility of doing continuous glucose monitoring especially since he does not have any postprandial blood sugars.  Explained to him how the freestyle libre sensor would be used but he is still not comfortable doing it  His wife is going to look into this further and given him contact information on where to get his supplies if he decides to go with it  Reminded him to bolus small amounts such as 2-3 units for any snacks he has late at night with carbohydrate  Periodically check readings 2 hours after eating  Follow-up in 3 months  Neuropathy: Symptoms are controlled and I will continue gabapentin  HYPOTHYROIDISM: TSH to be checked on the next visit  RENAL dysfunction: Followed by nephrologist    There are no Patient Instructions on file for this visit.        Elayne Snare 09/08/2019, 11:43 AM   Note: This office note was prepared with Dragon voice recognition system technology. Any transcriptional errors that result from this process are unintentional.

## 2019-09-09 ENCOUNTER — Other Ambulatory Visit: Payer: Self-pay | Admitting: Endocrinology

## 2019-09-10 ENCOUNTER — Telehealth: Payer: Self-pay

## 2019-09-10 NOTE — Telephone Encounter (Signed)
Okay for surgery.  Hold apixaban 3 days prior to procedure and resume after when okay with the surgeon. Kirk Ruths

## 2019-09-10 NOTE — Telephone Encounter (Signed)
   [facility] Pre-operative Risk Assessment    HEARTCARE STAFF: - Please ensure there is not already an duplicate clearance open for this procedure. - Under Visit Info/Reason for Call, type in Other and utilize the format Clearance MM/DD/YY or Clearance TBD. Do not use dashes or single digits.  Request for surgical clearance:  1. What type of surgery is being performed? Deep brain stimulator battery change   2. When is this surgery scheduled? 09-25-2019   3. What type of clearance is required (medical clearance vs. Pharmacy clearance to hold med vs. Both)? BOTH  4. Are there any medications that need to be held prior to surgery and how long?ELIQUIS   5. Practice name and name of physician performing surgery? [place], [place]  ATTN:JESSICA   6. What is the office phone number? [phone]   7.   What is the office fax number? [phone]  8.   Anesthesia type (None, local, MAC, general) ? GENERAL

## 2019-09-10 NOTE — Telephone Encounter (Addendum)
Henry [name] can you call the surgeons office and see if they will give you an estimate of the possible bleeding risk for this procedure?  Thanks

## 2019-09-10 NOTE — Telephone Encounter (Signed)
   Primary Cardiologist: Kirk Ruths, MD  Chart revisited as part of pre-operative protocol coverage. Per Dr. Stanford Breed, patient is OK to proceed with surgery as requested. He also gives clearance for patient to hold apixaban 3 days prior to procedure and resume after when okay with the surgeon. Discussed with pharmD who also agrees with this recommendation.  I will route this recommendation to the requesting party via Epic fax function and remove from pre-op pool.  Please call with questions.  Charlie Pitter, PA-C 09/10/2019, 10:24 AM

## 2019-09-10 NOTE — Telephone Encounter (Signed)
Patient with diagnosis of A Fib on Eliquis for anticoagulation.    Procedure: Deep brain stimulator battery change  Date of procedure: 9/16  CHADS2-VASc score of  5 (CHF, HTN, AGE, DM2, CAD)  CrCl 67 mL/min using adjusted body weight  Per office protocol, patient can hold Eliquis for 3 days prior to procedure.

## 2019-09-10 NOTE — Telephone Encounter (Signed)
   Primary Cardiologist: Kirk Ruths, MD / Dr. Lovena Le / [facility]  Chart reviewed as part of pre-operative protocol coverage. Patient was contacted 09/10/2019 in reference to pre-operative risk assessment for pending surgery as outlined below.  Henry [name] was last seen on 08/19/19 by Roderic Palau, NP. He has history of CAD s/p prior CABG, Parkinson's DM, HTN, HLD, ICM, chronic combined CHF, ICD, PAF (new onset in 06/2019). In AV paced rhythm at last OV. Patient has historically low BP in context of Parkinson's.  I reached out to patient who reports he is clinically stable without new symptoms but he generally cannot exercise or walk much due to chronic back pain. He denies any new angina, dyspnea, palpitations or syncope but is unable to achieve >4 METS. He is mostly wheelchair bound. Therefore cannot clear him under our routine protocol without input from MD - will route to Dr. Stanford Breed for input on whether any further testing is needed prior to procedure or OK to clear.   Dr. Stanford Breed - Please route response to P CV DIV PREOP (the pre-op pool). Thank you.  Will also route to pharm for input on Eliquis.   Charlie Pitter, PA-C 09/10/2019, 8:38 AM

## 2019-09-11 ENCOUNTER — Other Ambulatory Visit: Payer: Self-pay | Admitting: Neurology

## 2019-09-11 NOTE — Telephone Encounter (Signed)
Rx(s) sent to pharmacy electronically.  

## 2019-09-18 ENCOUNTER — Other Ambulatory Visit: Payer: Self-pay | Admitting: Endocrinology

## 2019-09-22 ENCOUNTER — Other Ambulatory Visit (HOSPITAL_COMMUNITY)
Admission: RE | Admit: 2019-09-22 | Discharge: 2019-09-22 | Disposition: A | Discharge status: Home / Self Care | Payer: Medicare Other | Source: Ambulatory Visit | Attending: Neurosurgery | Admitting: Neurosurgery

## 2019-09-22 DIAGNOSIS — Z20822 Contact with and (suspected) exposure to covid-19: Secondary | ICD-10-CM | POA: Insufficient documentation

## 2019-09-22 DIAGNOSIS — Z01812 Encounter for preprocedural laboratory examination: Secondary | ICD-10-CM | POA: Diagnosis not present

## 2019-09-22 DIAGNOSIS — G20 Parkinson's disease: Secondary | ICD-10-CM | POA: Diagnosis not present

## 2019-09-22 DIAGNOSIS — Z462 Encounter for fitting and adjustment of other devices related to nervous system and special senses: Secondary | ICD-10-CM | POA: Diagnosis not present

## 2019-09-22 LAB — SARS CORONAVIRUS 2 (TAT 6-24 HRS): SARS Coronavirus 2: NEGATIVE

## 2019-09-23 ENCOUNTER — Encounter: Payer: Self-pay | Admitting: Internal Medicine

## 2019-09-23 ENCOUNTER — Encounter (HOSPITAL_COMMUNITY): Payer: Self-pay | Admitting: Neurosurgery

## 2019-09-23 NOTE — Progress Notes (Signed)
Emailed device clinic regarding ICD-Metronic.  Sent email to [employer] and cc'd [employer] on both.

## 2019-09-23 NOTE — Progress Notes (Signed)
Spoke with Diabetes Coordinator and made her aware of upcoming procedure.

## 2019-09-23 NOTE — H&P (Signed)
Patient ID:   [id number] Patient: Henry [name]  Date of Birth: 08-28-1950 Visit Type: Office Visit   Date: 09/22/2019 10:30 AM Provider: Marchia Meiers. Vertell Limber MD   This 69 year old male presents for Discuss DBS battery change.  HISTORY OF PRESENT ILLNESS: 1.  Discuss DBS battery change  Henry [name] was brought in today to discuss right chest pulse generator replacement.  His neurologist, Dr. Wells Guiles [name], called last week to report his DBS IPG would require replacement soon.   Patient is doing well overall, though he is in a wheelchair today due to his Parkinson's.    Eliquis stopped after his Saturday dose.  He is scheduled for right chest IPG replacement this Thursday, 09/25/2019.      Medical/Surgical/Interim History Reviewed, no change.  Last detailed document date:07/31/2012.     Family History: Reviewed, no changes.  Last detailed document date:07/31/2012.   Social History: Reviewed, no changes. Last detailed document date: 07/31/2012.    MEDICATIONS: (added, continued or stopped this visit) Started Medication Directions Instruction Stopped  acetaminophen 325 mg tablet take 1 tablet by oral route  every 4 hours as needed    allopurinol 100 mg tablet take 1 tablet by oral route  every day    Aspir-81 81 mg tablet,delayed release take 1 tablet by oral route  every day    carbidopa ER 25 mg-levodopa 100 mg tablet,extended release take 1 tablet by oral route 2 times every day    colchicine 0.6 mg tablet take 1 tablet by oral route  every 1 - 2 hours until gout pain subsides    Crestor 20 mg tablet take 1 tablet by oral route  every day    Eliquis 5 mg tablet take 1 tablet by oral route 2 times every day    fluticasone 250 mcg-salmeterol 50 mcg/dose blistr powdr for inhalation inhale 1 puff by inhalation route 2 times every day in the morning and evening approximately 12 hours apart    gabapentin 100 mg capsule take 3 capsule by  oral route 3 times every day    iron 325 mg (65 mg iron) tablet take 1 tablet by oral route  every day    Janumet XR 50 mg-1,000 mg tablet,extended release take 1 tablet by oral route 2 times every day    klor-con M20 35mEq ORAL TABLET take 1 tablet by oral route 3 times every day    Lasix 80 mg tablet take 1 tablet by oral route  every day    lisinopril 2.5 mg tablet take 1 tablet by oral route  every day    methimazole 10 mg tablet take 1 tablet by oral route  every day    metoprolol succinate ER 50 mg tablet,extended release 24 hr take 1 tablet by oral route  every day    Miralax 17 gram/dose oral powder take (17G)  by oral route  every day mixed with 8 oz. water, juice, soda, coffee or tea    multivitamin nature complete  ORAL     Myrbetriq 50 mg tablet,extended release take 1 tablet by oral route  every day swallowing whole with water. Do not crush, chew and/or divide.    nitroglycerin 0.4 mg sublingual tablet place 1 tablet by sublingual route at 1st sign of attack; may repeat every 5 minutes up to 3 tabs; if norelief seek medical help    ProAir RespiClick 90 mcg/actuation breath activated inhale 2 puff by inhalation route  every 4 - 6 hours as needed  Tirosint 125 mcg capsule take 1 capsule by oral route  every day    tramadol 50 mg tablet take 1 tablet by oral route  every 6 hours as needed    Travatan Z 0.004 % eye drops instill 1 drop by ophthalmic route  every day into affected eye(s) in the evening    Victoza 2-Pak 0.6 mg/0.1 mL (18 mg/3 mL) subcutaneous pen injector inject (1.2MG )  by subcutaneous route  every day    Vitamin B 12  BUCCAL 162mcg    Vitamin D3 1,000 unit tablet       ALLERGIES: Ingredient Reaction Medication Name Comment PENICILLINS     Reviewed, no changes.    PHYSICAL EXAM:  Vitals Date Temp F BP Pulse Ht In Wt Lb BMI BSA Pain  Score 09/22/2019  109/72 73 68 241 36.64  0/10     IMPRESSION:  Henry [name] is in good spirits, accompanied by his wife.  Patient is on an anti-coagulant, anti-inflammatory or supplement that may increase bleeding time. Patient advised to stop medicine prior to surgery.  Comments:  Patient stopped Eliquis after his Saturday dose  PLAN: Proceed with right chest IPG change for depleted battery this Thursday at [facility].  Office follow-up in 3 weeks for incision check.   Assessment/Plan  # Detail Type Description  1. Assessment End of battery life of deep brain stimulator (Z46.2).     2. Assessment Paralysis agitans (G20).                   Provider:  Marchia Meiers. Vertell Limber MD  09/22/2019 11:07 AM    Dictation edited by: Mike Craze. Poteat RN    CC Providers: [place] at [facility] [place],  [place]  [id number]-   James Love  [facility] [address] [place], [place] [address]-               Electronically signed by Marchia Meiers. Vertell Limber MD on 09/22/2019 11:15 AM

## 2019-09-23 NOTE — Progress Notes (Signed)
[facility] DEVICE PROGRAMMING   Patient Information: Name: [name]  DOB: [birth year]  MRN: [id number]  Planned Procedure: Deep brain stimulator battery change  Surgeon: Dr. Erline Levine  Date of Procedure: 09/25/19  Cautery will be used.  Position during surgery:    Patient: Henry [name]  DOB: 21-Jun-2050   Please send documentation back to:  [facility] (Fax # [phone])   Donata Clay, RN  09/23/2019 10:39 AM     Device Information:   Clinic EP Physician:   Cristopher Peru, MD Device Type:  Defibrillator Manufacturer and Phone #:  [employer]: [phone] Pacemaker Dependent?:  No Date of Last Device Check:  07/23/2019        Normal Device Function?:  Yes     Electrophysiologist's Recommendations:    Have magnet available.  Provide continuous ECG monitoring when magnet is used or reprogramming is to be performed.   Per Device Clinic Standing Orders, Drake Leach  09/23/2019 11:32 AM

## 2019-09-24 MED ORDER — VANCOMYCIN HCL 1500 MG/300ML IV SOLN
1500.0000 mg | INTRAVENOUS | Status: AC
  Administered 2019-09-25: 1500 mg via INTRAVENOUS
  Filled 2019-09-24: qty 300

## 2019-09-24 NOTE — Progress Notes (Addendum)
Anesthesia Chart Review:  Follows with cardiology for history of CAD s/p prior CABG, HTN, HLD, ICM (s/p ICD placed 11/2013), chronic combined CHF, ICD, PAF (new onset in 06/2019). In AV paced rhythm at last OV. Patient has historically low BP in context of Parkinson's.  Last seen on 08/19/19 by Roderic Palau, NP.  Regarding cardiac clearance, there is an initial telephone encounter 09/10/2019 from [facility], PA-C stating. "I reached out to patient who reports he is clinically stable without new symptoms but he generally cannot exercise or walk much due to chronic back pain. He denies any new angina, dyspnea, palpitations or syncope but is unable to achieve >4 METS. He is mostly wheelchair bound. Therefore cannot clear him under our routine protocol without input from MD - will route to Dr. Stanford Breed for input on whether any further testing is needed prior to procedure or OK to clear."  Dr. Stanford Breed subsequently commented that it was okay for patient to proceed with surgery and hold apixaban 3 days prior.  Formal clearance per telephone encounter 09/10/2019, "Chart revisited as part of pre-operative protocol coverage. Per Dr. Stanford Breed, patient is OK to proceed with surgery as requested. He also gives clearance for patient to hold apixaban 3 days prior to procedure and resume after when okay with the surgeon. Discussed with pharmD who also agrees with this recommendation."  Parkinson's disease status post bilateral DBS STN surgery.  Followed by neurologist Dr. Carles Collet.  OSA, unable to tolerate CPAP.   IDDM 2 well-controlled, A1c 5.8. Pt on insulin pump.  Preop labs reviewed, creatinine mildly elevated 1.29, otherwise unremarkable.  EKG 08/19/19: Normal sinus rhythm. Rate 72. Poor anterior R wave progression. Non-specific ST-t changes. Compared to previous tracing. T wave inversion is less pronounced. Interpretation limited secondary to artifact  Perioperative device instructions 09/23/2019: Device  Information:  Clinic EP Physician:Gregg Lovena Le, MD Device Type:Defibrillator Manufacturer and Phone #:Medtronic: [phone] Pacemaker Dependent?:No Date of Last Device Check:7/14/2021Normal Device Function?:Yes  Electrophysiologist's Recommendations:   Have magnet available.  Provide continuous ECG monitoring when magnet is used or reprogramming is to be performed.   TTE 06/02/19: 1. Assessment of focal wall motion abnormalities technically difficult  even with definity contrast due to body habitus. There appears to be  akinesis of the mid anteroseptal and apical anterior walls. Left  ventricular ejection fraction, by estimation, is  40 to 45%. The left ventricle has mildly decreased function. The left  ventricle has no regional wall motion abnormalities. Left ventricular  diastolic parameters are consistent with Grade I diastolic dysfunction  (impaired relaxation).  2. Right ventricular systolic function is normal. The right ventricular  size is normal.  3. The mitral valve is normal in structure. No evidence of mitral valve  regurgitation. No evidence of mitral stenosis.  4. The aortic valve is tricuspid. Aortic valve regurgitation is not  visualized. Mild to moderate aortic valve sclerosis/calcification is  present, without any evidence of aortic stenosis.  5. The inferior vena cava is normal in size with greater than 50%  respiratory variability, suggesting right atrial pressure of 3 mmHg.   Wynonia Musty [facility] Short Stay Center/Anesthesiology Phone [phone] 09/24/2019 11:20 AM

## 2019-09-24 NOTE — Anesthesia Preprocedure Evaluation (Addendum)
Anesthesia Evaluation  Patient identified by MRN, date of birth, ID band Patient awake    Reviewed: Allergy & Precautions, NPO status , Patient's Chart, lab work & pertinent test results  Airway Mallampati: II  TM Distance: >3 FB Neck ROM: Full    Dental no notable dental hx.    Pulmonary sleep apnea ,    Pulmonary exam normal breath sounds clear to auscultation       Cardiovascular hypertension, + CAD and + Past MI  Normal cardiovascular exam+ Cardiac Defibrillator  Rhythm:Regular Rate:Normal     Neuro/Psych Parkinsons dz negative psych ROS   GI/Hepatic negative GI ROS, Neg liver ROS,   Endo/Other  diabetes, Insulin DependentHypothyroidism   Renal/GU negative Renal ROS  negative genitourinary   Musculoskeletal negative musculoskeletal ROS (+)   Abdominal   Peds negative pediatric ROS (+)  Hematology negative hematology ROS (+)   Anesthesia Other Findings   Reproductive/Obstetrics negative OB ROS                            Anesthesia Physical Anesthesia Plan  ASA: IV  Anesthesia Plan: General   Post-op Pain Management:    Induction: Intravenous  PONV Risk Score and Plan: 2 and Ondansetron, Dexamethasone and Treatment may vary due to age or medical condition  Airway Management Planned: Oral ETT  Additional Equipment:   Intra-op Plan:   Post-operative Plan: Extubation in OR  Informed Consent: I have reviewed the patients History and Physical, chart, labs and discussed the procedure including the risks, benefits and alternatives for the proposed anesthesia with the patient or authorized representative who has indicated his/her understanding and acceptance.     Dental advisory given  Plan Discussed with: CRNA and Surgeon  Anesthesia Plan Comments: (PAT note by Karoline Caldwell, PA-C: Follows with cardiology for history of CAD s/p prior CABG, HTN, HLD, ICM (s/p ICD placed  11/2013), chronic combined CHF, ICD, PAF (new onset in 06/2019). In AV paced rhythm at last OV. Patient has historically low BP in context of Parkinson's.  Last seen on 08/19/19 by Roderic Palau, NP.  Regarding cardiac clearance, there is an initial telephone encounter 09/10/2019 from [facility], PA-C stating. "I reached out to patient who reports he is clinically stable without new symptoms but he generally cannot exercise or walk much due to chronic back pain. He denies any new angina, dyspnea, palpitations or syncope but is unable to achieve >4 METS. He is mostly wheelchair bound. Therefore cannot clear him under our routine protocol without input from MD - will route to Dr. Stanford Breed for input on whether any further testing is needed prior to procedure or OK to clear."  Dr. Stanford Breed subsequently commented that it was okay for patient to proceed with surgery and hold apixaban 3 days prior.  Formal clearance per telephone encounter 09/10/2019, "Chart revisited as part of pre-operative protocol coverage. Per Dr. Stanford Breed, patient is OK to proceed with surgery as requested. He also gives clearance for patient to hold apixaban 3 days prior to procedure and resume after when okay with the surgeon. Discussed with pharmD who also agrees with this recommendation."  Parkinson's disease status post bilateral DBS STN surgery.  Followed by neurologist Dr. Carles Collet.  OSA, unable to tolerate CPAP.   IDDM 2 well-controlled, A1c 5.8. Pt on insulin pump.  Preop labs reviewed, creatinine mildly elevated 1.29, otherwise unremarkable.  EKG 08/19/19: Normal sinus rhythm. Rate 72. Poor anterior R wave progression. Non-specific ST-t  changes. Compared to previous tracing. T wave inversion is less pronounced. Interpretation limited secondary to artifact  Perioperative device instructions 09/23/2019: Device Information:  Clinic EP Physician:Gregg Lovena Le, MD Device Type:Defibrillator Manufacturer and Phone #:Medtronic:  [phone] Pacemaker Dependent?:No Date of Last Device Check:7/14/2021Normal Device Function?:Yes  Electrophysiologist's Recommendations:  Have magnet available. Provide continuous ECG monitoring when magnet is used or reprogramming is to be performed.   TTE 06/02/19: 1. Assessment of focal wall motion abnormalities technically difficult  even with definity contrast due to body habitus. There appears to be  akinesis of the mid anteroseptal and apical anterior walls. Left  ventricular ejection fraction, by estimation, is  40 to 45%. The left ventricle has mildly decreased function. The left  ventricle has no regional wall motion abnormalities. Left ventricular  diastolic parameters are consistent with Grade I diastolic dysfunction  (impaired relaxation).  2. Right ventricular systolic function is normal. The right ventricular  size is normal.  3. The mitral valve is normal in structure. No evidence of mitral valve  regurgitation. No evidence of mitral stenosis.  4. The aortic valve is tricuspid. Aortic valve regurgitation is not  visualized. Mild to moderate aortic valve sclerosis/calcification is  present, without any evidence of aortic stenosis.  5. The inferior vena cava is normal in size with greater than 50%  respiratory variability, suggesting right atrial pressure of 3 mmHg.  )       Anesthesia Quick Evaluation

## 2019-09-25 ENCOUNTER — Encounter (HOSPITAL_COMMUNITY)
Admission: RE | Disposition: A | Discharge status: Home / Self Care | Payer: Self-pay | Source: Home / Self Care | Attending: Neurosurgery

## 2019-09-25 ENCOUNTER — Ambulatory Visit (HOSPITAL_COMMUNITY): Payer: Medicare Other | Admitting: Physician Assistant

## 2019-09-25 ENCOUNTER — Ambulatory Visit (HOSPITAL_COMMUNITY)
Admission: RE | Admit: 2019-09-25 | Discharge: 2019-09-25 | Disposition: A | Discharge status: Home / Self Care | Payer: Medicare Other | Attending: Neurosurgery | Admitting: Neurosurgery

## 2019-09-25 ENCOUNTER — Other Ambulatory Visit: Payer: Self-pay

## 2019-09-25 ENCOUNTER — Encounter: Payer: Medicare Other | Admitting: Neurology

## 2019-09-25 ENCOUNTER — Encounter (HOSPITAL_COMMUNITY): Payer: Self-pay | Admitting: Neurosurgery

## 2019-09-25 DIAGNOSIS — Z794 Long term (current) use of insulin: Secondary | ICD-10-CM | POA: Diagnosis not present

## 2019-09-25 DIAGNOSIS — Z880 Allergy status to penicillin: Secondary | ICD-10-CM | POA: Diagnosis not present

## 2019-09-25 DIAGNOSIS — I480 Paroxysmal atrial fibrillation: Secondary | ICD-10-CM | POA: Insufficient documentation

## 2019-09-25 DIAGNOSIS — Z95810 Presence of automatic (implantable) cardiac defibrillator: Secondary | ICD-10-CM | POA: Insufficient documentation

## 2019-09-25 DIAGNOSIS — I252 Old myocardial infarction: Secondary | ICD-10-CM | POA: Insufficient documentation

## 2019-09-25 DIAGNOSIS — Z7951 Long term (current) use of inhaled steroids: Secondary | ICD-10-CM | POA: Insufficient documentation

## 2019-09-25 DIAGNOSIS — Z7982 Long term (current) use of aspirin: Secondary | ICD-10-CM | POA: Diagnosis not present

## 2019-09-25 DIAGNOSIS — E119 Type 2 diabetes mellitus without complications: Secondary | ICD-10-CM | POA: Insufficient documentation

## 2019-09-25 DIAGNOSIS — I110 Hypertensive heart disease with heart failure: Secondary | ICD-10-CM | POA: Diagnosis not present

## 2019-09-25 DIAGNOSIS — Z7901 Long term (current) use of anticoagulants: Secondary | ICD-10-CM | POA: Diagnosis not present

## 2019-09-25 DIAGNOSIS — G4733 Obstructive sleep apnea (adult) (pediatric): Secondary | ICD-10-CM | POA: Diagnosis not present

## 2019-09-25 DIAGNOSIS — Z951 Presence of aortocoronary bypass graft: Secondary | ICD-10-CM | POA: Diagnosis not present

## 2019-09-25 DIAGNOSIS — Z4542 Encounter for adjustment and management of neuropacemaker (brain) (peripheral nerve) (spinal cord): Secondary | ICD-10-CM | POA: Insufficient documentation

## 2019-09-25 DIAGNOSIS — Z79899 Other long term (current) drug therapy: Secondary | ICD-10-CM | POA: Insufficient documentation

## 2019-09-25 DIAGNOSIS — E785 Hyperlipidemia, unspecified: Secondary | ICD-10-CM | POA: Insufficient documentation

## 2019-09-25 DIAGNOSIS — I5042 Chronic combined systolic (congestive) and diastolic (congestive) heart failure: Secondary | ICD-10-CM | POA: Diagnosis not present

## 2019-09-25 DIAGNOSIS — Z9641 Presence of insulin pump (external) (internal): Secondary | ICD-10-CM | POA: Insufficient documentation

## 2019-09-25 DIAGNOSIS — K219 Gastro-esophageal reflux disease without esophagitis: Secondary | ICD-10-CM | POA: Diagnosis not present

## 2019-09-25 DIAGNOSIS — G20 Parkinson's disease: Secondary | ICD-10-CM | POA: Insufficient documentation

## 2019-09-25 DIAGNOSIS — I2510 Atherosclerotic heart disease of native coronary artery without angina pectoris: Secondary | ICD-10-CM | POA: Diagnosis not present

## 2019-09-25 HISTORY — DX: Dependence on other enabling machines and devices: Z99.89

## 2019-09-25 HISTORY — PX: SUBTHALAMIC STIMULATOR BATTERY REPLACEMENT: SHX5405

## 2019-09-25 HISTORY — DX: Personal history of urinary calculi: Z87.442

## 2019-09-25 HISTORY — DX: Cardiac arrhythmia, unspecified: I49.9

## 2019-09-25 HISTORY — DX: Hypothyroidism, unspecified: E03.9

## 2019-09-25 LAB — BASIC METABOLIC PANEL
Anion gap: 11 (ref 5–15)
BUN: 19 mg/dL (ref 8–23)
CO2: 31 mmol/L (ref 22–32)
Calcium: 9.2 mg/dL (ref 8.9–10.3)
Chloride: 100 mmol/L (ref 98–111)
Creatinine, Ser: 1.36 mg/dL — ABNORMAL HIGH (ref 0.61–1.24)
GFR calc Af Amer: >60 mL/min (ref >60)
GFR calc non Af Amer: 53 mL/min — ABNORMAL LOW (ref >60)
Glucose, Bld: 83 mg/dL (ref 70–99)
Potassium: 3.7 mmol/L (ref 3.5–5.1)
Sodium: 142 mmol/L (ref 135–145)

## 2019-09-25 LAB — CBC
HCT: 43.7 % (ref 39.0–52.0)
Hemoglobin: 13.9 g/dL (ref 13.0–17.0)
MCH: 27.6 pg (ref 26.0–34.0)
MCHC: 31.8 g/dL (ref 30.0–36.0)
MCV: 86.9 fL (ref 80.0–100.0)
Platelets: 195 10*3/uL (ref 150–400)
RBC: 5.03 MIL/uL (ref 4.22–5.81)
RDW: 13.8 % (ref 11.5–15.5)
WBC: 6.2 10*3/uL (ref 4.0–10.5)
nRBC: 0 % (ref 0.0–0.2)

## 2019-09-25 LAB — GLUCOSE, CAPILLARY
Glucose-Capillary: 92 mg/dL (ref 70–99)
Glucose-Capillary: 92 mg/dL (ref 70–99)
Glucose-Capillary: 138 mg/dL — ABNORMAL HIGH (ref 70–99)
Glucose-Capillary: 64 mg/dL — ABNORMAL LOW (ref 70–99)

## 2019-09-25 SURGERY — SUBTHALAMIC STIMULATOR BATTERY REPLACEMENT
Anesthesia: General | Site: Chest | Laterality: Right

## 2019-09-25 MED ORDER — DEXTROSE 50 % IV SOLN
INTRAVENOUS | Status: AC
  Filled 2019-09-25: qty 50

## 2019-09-25 MED ORDER — CHLORHEXIDINE GLUCONATE 0.12 % MT SOLN
OROMUCOSAL | Status: AC
  Administered 2019-09-25: 15 mL via OROMUCOSAL
  Filled 2019-09-25: qty 15

## 2019-09-25 MED ORDER — FENTANYL CITRATE (PF) 100 MCG/2ML IJ SOLN: 25.0000 ug | INTRAMUSCULAR | Status: DC | PRN

## 2019-09-25 MED ORDER — PROPOFOL 10 MG/ML IV BOLUS
INTRAVENOUS | Status: DC | PRN
  Administered 2019-09-25: 110 mg via INTRAVENOUS

## 2019-09-25 MED ORDER — LACTATED RINGERS IV SOLN: INTRAVENOUS | Status: DC

## 2019-09-25 MED ORDER — DEXAMETHASONE SODIUM PHOSPHATE 10 MG/ML IJ SOLN
INTRAMUSCULAR | Status: DC | PRN
  Administered 2019-09-25: 5 mg via INTRAVENOUS

## 2019-09-25 MED ORDER — CHLORHEXIDINE GLUCONATE CLOTH 2 % EX PADS: 6.0000 | MEDICATED_PAD | Freq: Once | CUTANEOUS | Status: DC

## 2019-09-25 MED ORDER — SUGAMMADEX SODIUM 200 MG/2ML IV SOLN
INTRAVENOUS | Status: DC | PRN
  Administered 2019-09-25: 400 mg via INTRAVENOUS

## 2019-09-25 MED ORDER — FENTANYL CITRATE (PF) 100 MCG/2ML IJ SOLN
INTRAMUSCULAR | Status: DC | PRN
Start: 2019-09-25 — End: 2019-09-25
  Administered 2019-09-25: 50 ug via INTRAVENOUS

## 2019-09-25 MED ORDER — MIDAZOLAM HCL 2 MG/2ML IJ SOLN
INTRAMUSCULAR | Status: AC
  Filled 2019-09-25: qty 2

## 2019-09-25 MED ORDER — LIDOCAINE-EPINEPHRINE 1 %-1:100000 IJ SOLN
INTRAMUSCULAR | Status: DC | PRN
  Administered 2019-09-25: 5 mL

## 2019-09-25 MED ORDER — DEXTROSE 50 % IV SOLN
1.0000 | Freq: Once | INTRAVENOUS | Status: AC
  Administered 2019-09-25: 50 mL via INTRAVENOUS

## 2019-09-25 MED ORDER — CHLORHEXIDINE GLUCONATE 0.12 % MT SOLN: 15.0000 mL | Freq: Once | OROMUCOSAL | Status: AC

## 2019-09-25 MED ORDER — VANCOMYCIN HCL 1000 MG IV SOLR
INTRAVENOUS | Status: AC
  Filled 2019-09-25: qty 1000

## 2019-09-25 MED ORDER — PHENYLEPHRINE 40 MCG/ML (10ML) SYRINGE FOR IV PUSH (FOR BLOOD PRESSURE SUPPORT)
PREFILLED_SYRINGE | INTRAVENOUS | Status: AC
  Filled 2019-09-25: qty 10

## 2019-09-25 MED ORDER — ROCURONIUM BROMIDE 10 MG/ML (PF) SYRINGE
PREFILLED_SYRINGE | INTRAVENOUS | Status: DC | PRN
  Administered 2019-09-25: 50 mg via INTRAVENOUS

## 2019-09-25 MED ORDER — ONDANSETRON HCL 4 MG/2ML IJ SOLN
INTRAMUSCULAR | Status: AC
  Filled 2019-09-25: qty 2

## 2019-09-25 MED ORDER — LIDOCAINE 2% (20 MG/ML) 5 ML SYRINGE
INTRAMUSCULAR | Status: DC | PRN
  Administered 2019-09-25: 100 mg via INTRAVENOUS

## 2019-09-25 MED ORDER — PHENYLEPHRINE HCL (PRESSORS) 10 MG/ML IV SOLN
INTRAVENOUS | Status: DC | PRN
  Administered 2019-09-25: 80 ug via INTRAVENOUS

## 2019-09-25 MED ORDER — LIDOCAINE-EPINEPHRINE 1 %-1:100000 IJ SOLN
INTRAMUSCULAR | Status: AC
  Filled 2019-09-25: qty 1

## 2019-09-25 MED ORDER — BUPIVACAINE HCL (PF) 0.5 % IJ SOLN
INTRAMUSCULAR | Status: DC | PRN
  Administered 2019-09-25: 5 mL

## 2019-09-25 MED ORDER — FENTANYL CITRATE (PF) 250 MCG/5ML IJ SOLN
INTRAMUSCULAR | Status: AC
  Filled 2019-09-25: qty 5

## 2019-09-25 MED ORDER — ORAL CARE MOUTH RINSE: 15.0000 mL | Freq: Once | OROMUCOSAL | Status: AC

## 2019-09-25 MED ORDER — VANCOMYCIN HCL 1000 MG IV SOLR
INTRAVENOUS | Status: DC | PRN
  Administered 2019-09-25: 1000 mg

## 2019-09-25 MED ORDER — 0.9 % SODIUM CHLORIDE (POUR BTL) OPTIME
TOPICAL | Status: DC | PRN
  Administered 2019-09-25: 1000 mL

## 2019-09-25 MED ORDER — BUPIVACAINE HCL (PF) 0.5 % IJ SOLN
INTRAMUSCULAR | Status: AC
  Filled 2019-09-25: qty 30

## 2019-09-25 MED ORDER — ONDANSETRON HCL 4 MG/2ML IJ SOLN
INTRAMUSCULAR | Status: DC | PRN
  Administered 2019-09-25: 4 mg via INTRAVENOUS

## 2019-09-25 SURGICAL SUPPLY — 47 items
ADH SKN CLS APL DERMABOND .7 (GAUZE/BANDAGES/DRESSINGS) ×1
CANISTER SUCT 3000ML PPV (MISCELLANEOUS) ×2 IMPLANT
CARTRIDGE OIL MAESTRO DRILL (MISCELLANEOUS) ×1 IMPLANT
CONNECTOR PLUG BORE DBS STIM (Connector) ×1 IMPLANT
COVER WAND RF STERILE (DRAPES) ×2 IMPLANT
DECANTER SPIKE VIAL GLASS SM (MISCELLANEOUS) ×2 IMPLANT
DERMABOND ADVANCED (GAUZE/BANDAGES/DRESSINGS) ×1
DERMABOND ADVANCED .7 DNX12 (GAUZE/BANDAGES/DRESSINGS) ×1 IMPLANT
DIFFUSER DRILL AIR PNEUMATIC (MISCELLANEOUS) ×1 IMPLANT
DRAPE LAPAROTOMY 100X72 PEDS (DRAPES) ×2 IMPLANT
DRSG OPSITE POSTOP 4X6 (GAUZE/BANDAGES/DRESSINGS) ×1 IMPLANT
DURAPREP 26ML APPLICATOR (WOUND CARE) ×2 IMPLANT
GAUZE 4X4 16PLY RFD (DISPOSABLE) IMPLANT
GLOVE BIO SURGEON STRL SZ8 (GLOVE) ×2 IMPLANT
GLOVE BIOGEL PI IND STRL 7.0 (GLOVE) IMPLANT
GLOVE BIOGEL PI IND STRL 7.5 (GLOVE) IMPLANT
GLOVE BIOGEL PI IND STRL 8 (GLOVE) ×1 IMPLANT
GLOVE BIOGEL PI IND STRL 8.5 (GLOVE) ×1 IMPLANT
GLOVE BIOGEL PI INDICATOR 7.0 (GLOVE) ×1
GLOVE BIOGEL PI INDICATOR 7.5 (GLOVE) ×1
GLOVE BIOGEL PI INDICATOR 8 (GLOVE) ×2
GLOVE BIOGEL PI INDICATOR 8.5 (GLOVE) ×1
GLOVE ECLIPSE 7.5 STRL STRAW (GLOVE) ×1 IMPLANT
GLOVE ECLIPSE 8.0 STRL XLNG CF (GLOVE) ×2 IMPLANT
GLOVE EXAM NITRILE XL STR (GLOVE) IMPLANT
GLOVE SURG SS PI 7.5 STRL IVOR (GLOVE) ×1 IMPLANT
GOWN STRL REUS W/ TWL LRG LVL3 (GOWN DISPOSABLE) IMPLANT
GOWN STRL REUS W/ TWL XL LVL3 (GOWN DISPOSABLE) ×1 IMPLANT
GOWN STRL REUS W/TWL 2XL LVL3 (GOWN DISPOSABLE) ×2 IMPLANT
GOWN STRL REUS W/TWL LRG LVL3 (GOWN DISPOSABLE)
GOWN STRL REUS W/TWL XL LVL3 (GOWN DISPOSABLE) ×2
KIT BASIN OR (CUSTOM PROCEDURE TRAY) ×2 IMPLANT
KIT HANDSET DBS COM PERCEPT PC (NEUROSURGERY SUPPLIES) ×1 IMPLANT
KIT TURNOVER KIT B (KITS) ×2 IMPLANT
NDL HYPO 25X1 1.5 SAFETY (NEEDLE) ×1 IMPLANT
NEEDLE HYPO 25X1 1.5 SAFETY (NEEDLE) ×2 IMPLANT
NEUROSTIM DBS PERCEPT PC (Neurostimulator) IMPLANT
NEUROSTIMULATOR DBS PERCEPT PC (Neurostimulator) ×2 IMPLANT
NS IRRIG 1000ML POUR BTL (IV SOLUTION) ×2 IMPLANT
OIL CARTRIDGE MAESTRO DRILL (MISCELLANEOUS)
PACK LAMINECTOMY NEURO (CUSTOM PROCEDURE TRAY) ×2 IMPLANT
PAD ARMBOARD 7.5X6 YLW CONV (MISCELLANEOUS) ×6 IMPLANT
SUT VIC AB 2-0 CP2 18 (SUTURE) ×2 IMPLANT
SUT VIC AB 3-0 SH 8-18 (SUTURE) ×2 IMPLANT
TOWEL GREEN STERILE (TOWEL DISPOSABLE) ×2 IMPLANT
TOWEL GREEN STERILE FF (TOWEL DISPOSABLE) ×2 IMPLANT
WATER STERILE IRR 1000ML POUR (IV SOLUTION) ×2 IMPLANT

## 2019-09-25 NOTE — Op Note (Signed)
09/25/2019  5:53 PM  PATIENT:  Henry [name]  69 y.o. male  PRE-OPERATIVE DIAGNOSIS:  End of life for deep brain stimulator battery  POST-OPERATIVE DIAGNOSIS:  End of life for deep brain stimulator battery  PROCEDURE:  Procedure(s): Deep brain stimulator battery change (Right)  SURGEON:  Surgeon(s) and Role:    Erline Levine, MD - Primary  PHYSICIAN ASSISTANT:   ASSISTANTS: Poteat, RN   ANESTHESIA:   general  EBL:  Minimal  BLOOD ADMINISTERED:none  DRAINS: none   LOCAL MEDICATIONS USED:  MARCAINE    and LIDOCAINE   SPECIMEN:  No Specimen  DISPOSITION OF SPECIMEN:  N/A  COUNTS:  YES  TOURNIQUET:  * No tourniquets in log *  DICTATION: DICTATION: Patient has implanted subthalamic stimulator electrodes and IPG, which is now depleted.  It was elected for patient to undergo IPG revision.  PROCEDURE: Patient was brought to the operating room and given GETA.  Right upper chest was prepped with betadine scrub and Duraprep.  Area of planned incision was infiltrated with lidocaine.  Prior incision was reopened and the old IPG was externalized.  Adaptor was connected to new IPG which was placed in the pocket.  Wound was irrigated with vancomycin. Then irrigated once more.  Incision was closed with 2-0 Vicryl and 3-0 vicryl sutures and dressed with a sterile occlusive dressing.  Counts were correct at the end of the case.  PLAN OF CARE: Discharge to home after PACU  PATIENT DISPOSITION:  PACU - hemodynamically stable.   Delay start of Pharmacological VTE agent (>24hrs) due to surgical blood loss or risk of bleeding: yes

## 2019-09-25 NOTE — Transfer of Care (Signed)
Immediate Anesthesia Transfer of Care Note  Patient: Henry [name]  Procedure(s) Performed: Deep brain stimulator battery change (Right Chest)  Patient Location: PACU  Anesthesia Type:General  Level of Consciousness: drowsy  Airway & Oxygen Therapy: Patient Spontanous Breathing and Patient connected to nasal cannula oxygen  Post-op Assessment: Report given to RN, Post -op Vital signs reviewed and stable and Patient moving all extremities  Post vital signs: Reviewed and stable  Last Vitals:  Vitals Value Taken Time  BP 119/76 09/25/19 1805  Temp 36.4 C 09/25/19 1805  Pulse 59 09/25/19 1807  Resp 13 09/25/19 1807  SpO2 100 % 09/25/19 1807  Vitals shown include unvalidated device data.  Last Pain:  Vitals:   09/25/19 1805  TempSrc:   PainSc: Asleep      Patients Stated Pain Goal: 3 (23/34/35 6861)  Complications: No complications documented.

## 2019-09-25 NOTE — Brief Op Note (Signed)
09/25/2019  5:53 PM  PATIENT:  Henry [name]  69 y.o. male  PRE-OPERATIVE DIAGNOSIS:  End of life for deep brain stimulator battery  POST-OPERATIVE DIAGNOSIS:  End of life for deep brain stimulator battery  PROCEDURE:  Procedure(s): Deep brain stimulator battery change (Right)  SURGEON:  Surgeon(s) and Role:    Erline Levine, MD - Primary  PHYSICIAN ASSISTANT:   ASSISTANTS: Poteat, RN   ANESTHESIA:   general  EBL:  Minimal  BLOOD ADMINISTERED:none  DRAINS: none   LOCAL MEDICATIONS USED:  MARCAINE    and LIDOCAINE   SPECIMEN:  No Specimen  DISPOSITION OF SPECIMEN:  N/A  COUNTS:  YES  TOURNIQUET:  * No tourniquets in log *  DICTATION: DICTATION: Patient has implanted subthalamic stimulator electrodes and IPG, which is now depleted.  It was elected for patient to undergo IPG revision.  PROCEDURE: Patient was brought to the operating room and given GETA.  Right upper chest was prepped with betadine scrub and Duraprep.  Area of planned incision was infiltrated with lidocaine.  Prior incision was reopened and the old IPG was externalized.  Adaptor was connected to new IPG which was placed in the pocket.  Wound was irrigated with vancomycin. Then irrigated once more.  Incision was closed with 2-0 Vicryl and 3-0 vicryl sutures and dressed with a sterile occlusive dressing.  Counts were correct at the end of the case.  PLAN OF CARE: Discharge to home after PACU  PATIENT DISPOSITION:  PACU - hemodynamically stable.   Delay start of Pharmacological VTE agent (>24hrs) due to surgical blood loss or risk of bleeding: yes

## 2019-09-25 NOTE — Progress Notes (Signed)
Checked in on patient, doing well, will continue to monitor patient until he goes back to the OR for surgery.

## 2019-09-25 NOTE — Interval H&P Note (Signed)
History and Physical Interval Note:  09/25/2019 1:30 PM  Henry [name]  has presented today for surgery, with the diagnosis of End of life for deep brain stimulator battery.  The various methods of treatment have been discussed with the patient and family. After consideration of risks, benefits and other options for treatment, the patient has consented to  Procedure(s) with comments: Deep brain stimulator battery change (N/A) - 3C/Patient may d/c from PACU as a surgical intervention.  The patient's history has been reviewed, patient examined, no change in status, stable for surgery.  I have reviewed the patient's chart and labs.  Questions were answered to the patient's satisfaction.     [name]

## 2019-09-25 NOTE — Anesthesia Procedure Notes (Signed)
Procedure Name: Intubation Date/Time: 09/25/2019 4:56 PM Performed by: Querida Beretta T, CRNA Pre-anesthesia Checklist: Patient identified, Emergency Drugs available, Suction available and Patient being monitored Patient Re-evaluated:Patient Re-evaluated prior to induction Oxygen Delivery Method: Circle system utilized Preoxygenation: Pre-oxygenation with 100% oxygen Induction Type: IV induction Ventilation: Mask ventilation without difficulty Laryngoscope Size: Mac and 4 Grade View: Grade I Tube type: Oral Tube size: 7.5 mm Number of attempts: 1 Airway Equipment and Method: Stylet and Oral airway Placement Confirmation: ETT inserted through vocal cords under direct vision,  positive ETCO2 and breath sounds checked- equal and bilateral Secured at: 22 cm Tube secured with: Tape Dental Injury: Teeth and Oropharynx as per pre-operative assessment

## 2019-09-25 NOTE — Progress Notes (Signed)
Patient voided in Short Stay.  At that time I noticed a small circular sore, dime sized on lower front right leg, scabbed over.  Showed sore to CRNA Sharrie Rothman.

## 2019-09-25 NOTE — Progress Notes (Signed)
Patient left side slightly weaker than right. Dr. Vertell Limber notified and instructed [place] to allow for patient's wife to come see him and determine if that is normal for him due to his Parkinson's. Patient's wife determined that he looked normal. Mentation is normal as well. [employer] rep Renae Fickle notified of surgery and determined no actions needed for ICD. Swelling present around incision site and visualized by Dr. Vertell Limber. Verbal instructions to apply pressure dressing if swelling worsened, but swelling improved.   Henry Rainwater, RN

## 2019-09-25 NOTE — Progress Notes (Signed)
Called patient's wife Arbie Cookey [phone] to give her an update.  Informed her that the [employer] Rep. Candice will be coming out to see her in the lobby to go over the new battery information/instructions.  Candice's cell number is [phone].

## 2019-09-25 NOTE — Progress Notes (Signed)
[employer] Rep Candice in patient's SS room 40 going over new battery information.  Candice states she will also go over information with wife Arbie Cookey who is in lobby.

## 2019-09-26 ENCOUNTER — Encounter (HOSPITAL_COMMUNITY): Payer: Self-pay | Admitting: Neurosurgery

## 2019-09-26 NOTE — Anesthesia Postprocedure Evaluation (Signed)
Anesthesia Post Note  Patient: Henry [name]  Procedure(s) Performed: Deep brain stimulator battery change (Right Chest)     Patient location during evaluation: PACU Anesthesia Type: General Level of consciousness: awake and alert Pain management: pain level controlled Vital Signs Assessment: post-procedure vital signs reviewed and stable Respiratory status: spontaneous breathing, nonlabored ventilation, respiratory function stable and patient connected to nasal cannula oxygen Cardiovascular status: blood pressure returned to baseline and stable Postop Assessment: no apparent nausea or vomiting Anesthetic complications: no   No complications documented.  Last Vitals:  Vitals:   09/25/19 1835 09/25/19 1850  BP: 116/75 115/78  Pulse: 65 65  Resp: 13 16  Temp: (!) 36.1 C (!) 36.1 C  SpO2: 95% 97%    Last Pain:  Vitals:   09/25/19 1850  TempSrc:   PainSc: 0-No pain                 Schneur Crowson S

## 2019-09-30 ENCOUNTER — Encounter (HOSPITAL_COMMUNITY): Payer: Self-pay | Admitting: Neurosurgery

## 2019-10-02 ENCOUNTER — Other Ambulatory Visit: Payer: Self-pay | Admitting: Endocrinology

## 2019-10-02 ENCOUNTER — Telehealth: Payer: Self-pay | Admitting: Neurology

## 2019-10-02 NOTE — Telephone Encounter (Signed)
Spoke with wife and she states yes. Wife states she would like for the rep to check out the patient.

## 2019-10-02 NOTE — Telephone Encounter (Signed)
Pt is sch for 10-08-19 @ 10:15 and is aware of the appt. I put him as 60 mins

## 2019-10-02 NOTE — Telephone Encounter (Signed)
Put him in wed at 10:15 for movement/dbs appt.  I will let rep know once you confirm with the patient

## 2019-10-02 NOTE — Telephone Encounter (Signed)
The patient's wife called in stating the patient's DBS battery was just changed, but the patient has been shaking. She feels like something might need to be looked at.

## 2019-10-02 NOTE — Telephone Encounter (Signed)
Has it been like that since his surgery?  He had a different type of battery put in so I will have to have DBS rep come in to look at it with me if needed.

## 2019-10-06 ENCOUNTER — Encounter: Payer: Self-pay | Admitting: Family Medicine

## 2019-10-06 NOTE — Progress Notes (Signed)
Assessment/Plan:   1.  Parkinsons Disease             -Status post bilateral DBS STN surgery             -Battery changes occurring September 05, 2012; June 10, 2015; November 13, 2017; 09/25/19             -Patient really not ambulating any longer, which is good given the multiple falls.  Discussed with him that he needs to have somebody with him at all times where he needs to stay seated in a wheelchair, esp since dx with a-fib since last visit and now on eliquis             -Take carbidopa/levodopa 25/100 CR, 2 tablets at 7 AM/2 tablets at 11 AM/2 tablets at 3 PM/1 tablet at 7 PM             -Take carbidopa/levodopa 50/200 CR at bedtime 2. RBD -He has tried clonazepam several times and ultimately ends up stopping it because of side effect. Could try Rozerem in the future if insurance would pay. Decided to hold off for now as the patient states that while dreaming is vivid, it is not particularly bothersome and he is not falling out of the bed.Discussed bedroom safety. 3. Pseudobulbar affect (crying) -Wants no medication for that. 4. OSAS, noncompliant with CPAP. -Patient aware of risks/morbidity and mortality associated with untreated sleep apnea 5. CHF -ICD placed on 11/15/2013 6. Facial paresthesias -Has been going on forfew years and became bilateral, not c/w TN. He has had several negative noncontrast CTs of the brain. Kidney function is not good enough for contrasted CT. His DBS device is not MRI compatible. -I am unsure what this is, and offered to try carbamazepine, but he really did not want that. -I have turned off his DBS and he noticed no change in the facial paresthesias. 7. Sialorrhea -isn't interested in botox right ow 8.Day/night reversal -Long discussion with the patient regarding the importance of staying awake during the day and sleeping at night.  Discussed that this can ultimately cause confusion and hallucinations. 9. ? Myoclonus -wonder if from gabapentin, especially given that his kidney function is increasing somewhat.he didn't want to try going off.  Wonder if the "head banging" that he had post surgery was just worsening of myoclonus.  I did not see that today. 10. A-fib             -Now on Eliquis.  Discussed that if he should fall and hit head, he should go to the emergency room for evaluation.  More importantly, I told him again that he should stay seated because of fall risk.  Subjective:   Henry [name] was seen today in follow up for Parkinsons disease.  My previous records were reviewed prior to todays visit as well as outside records available to me. Pt worked in today.  Wife present and supplements the history.  [employer] representative present.  Pt had IPG changed on 09/25/19.  He feels that he has had more tremor since that time.  However, when he describes it today, it really is not tremor at all, but he was having jerking movements of his head, so much that he was banging his head on his chair.  Interestingly, they have not seen any of that today, the patient does state "I do not want to live like that."  He does report that he was also having some tremor on the  right side, but does not know why that is better either today.  He has an appointment later today with Dr. Vertell Limber.  Current prescribed movement disorder medications: Carbidopa/levodopa 25/100 CR, 2 tablets at 7 AM/11 AM/3 PM/1 tablet at 7 PM Carbidopa/levodopa 50/200 CR at bedtime    ALLERGIES:   Allergies  Allergen Reactions  . Penicillins Anaphylaxis and Rash    Because of a history of documented adverse serious drug reaction;Medi Alert bracelet  is recommended PATIENT HAS HAD A PCN REACTION WITH IMMEDIATE RASH, FACIAL/TONGUE/THROAT SWELLING, SOB, OR LIGHTHEADEDNESS WITH HYPOTENSION:  #  #  YES  #  #  Has patient had a PCN reaction causing  severe rash involving mucus membranes or skin necrosis: unknown Has patient had a PCN reaction that required hospitalization NO Has patient had a PCN reaction occurring within the last 10 years: NO  . Klonopin [Clonazepam] Other (See Comments)    agitation  . Peanut-Containing Drug Products Cough  . Watermelon [Citrullus Vulgaris] Other (See Comments) and Cough    Tickle in throat    CURRENT MEDICATIONS:  Outpatient Encounter Medications as of 10/08/2019  Medication Sig  . acetaminophen (TYLENOL) 500 MG tablet Take 1,000 mg by mouth at bedtime as needed for mild pain or headache.   . albuterol (VENTOLIN HFA) 108 (90 Base) MCG/ACT inhaler Inhale 2 puffs into the lungs every 6 (six) hours as needed for wheezing or shortness of breath.  . allopurinol (ZYLOPRIM) 100 MG tablet TAKE 1 & 1/2 TABLET BY MOUTH DAILY (Patient taking differently: Take 150 mg by mouth daily. )  . apixaban (ELIQUIS) 5 MG TABS tablet Take 1 tablet (5 mg total) by mouth 2 (two) times daily.  . carbidopa-levodopa (SINEMET CR) 50-200 MG tablet TAKE 1 TABLET BY MOUTH EVERYDAY AT BEDTIME (Patient taking differently: Take 1 tablet by mouth at bedtime. )  . Carbidopa-Levodopa ER (SINEMET CR) 25-100 MG tablet controlled release TAKE 2 TABLETS AT 7AM,THEN 2TABLETS AT 11AM,THEN 2 TABS AT 3PM AND 1TAB AT 7PM,&1TAB AT BEDTIME (Patient taking differently: Take 1-2 tablets by mouth See admin instructions. TAKE 2 TABLETS AT 7AM,THEN 2 TABLETS AT 11AM,THEN 2 TABS AT 3PM, 1TAB AT 7PM)  . Cholecalciferol (VITAMIN D) 50 MCG (2000 UT) tablet Take 2,000 Units by mouth daily.   . colchicine 0.6 MG tablet Take 1 tablet (0.6 mg total) by mouth 2 (two) times daily as needed (gout). (Patient taking differently: Take 0.6 mg by mouth as needed (gout). )  . dorzolamide-timolol (COSOPT) 22.3-6.8 MG/ML ophthalmic solution Place 1 drop into the right eye 2 (two) times daily.  . Fluticasone-Salmeterol (WIXELA INHUB) 250-50 MCG/DOSE AEPB Inhale 1 puff into the  lungs 2 (two) times daily.  Marland Kitchen FREESTYLE LITE test strip USE TO CHECK BLOOD SUGAR 4 TIMES DAILY.  . furosemide (LASIX) 80 MG tablet TAKE 1 & 1/2 TABLETS BY MOUTH DAILY (Patient taking differently: Take 120 mg by mouth daily. )  . gabapentin (NEURONTIN) 100 MG capsule 2 tabs in the AM and 4 in the PM. (Patient taking differently: Take 200-400 mg by mouth See admin instructions. Take 200 mg in the morning and 400 mg at night)  . glucose blood (ONETOUCH VERIO) test strip USE AS DIRECTED TO CHECK BLOOD SUGAR TWICE A DAY. DX:E11.65  . HUMALOG 100 UNIT/ML injection USE MAXIMUM 76 UNITS PER   DAY WITH V-GO PUMP (Patient taking differently: Uses in omnipod insulin pump)  . Insulin Disposable Pump (OMNIPOD DASH SYSTEM) KIT 1 each by Does not apply route.  Use Dash system for Continuous Blood Glucose Monitoring.  Marland Kitchen levothyroxine (SYNTHROID) 137 MCG tablet Take 1 tablet (137 mcg total) by mouth daily before breakfast.  . metolazone (ZAROXOLYN) 2.5 MG tablet TAKE 1 TABLET BY MOUTH ONCE A WEEK. (Patient taking differently: Take 2.5 mg by mouth every Friday. )  . metoprolol succinate (TOPROL-XL) 50 MG 24 hr tablet Take 50 mg by mouth at bedtime. Take with or immediately following a meal.   . mirabegron ER (MYRBETRIQ) 25 MG TB24 tablet Take 1 tablet (25 mg total) by mouth daily.  Marland Kitchen omega-3 acid ethyl esters (LOVAZA) 1 g capsule TAKE 2 CAPSULES (2GRAMS TOTAL) BY MOUTH TWICE DAILY. (Patient taking differently: Take 2 g by mouth 2 (two) times daily. )  . OVER THE COUNTER MEDICATION Apply 1 application topically daily as needed (pain). Theraworx Pain Cream   . polyethylene glycol (MIRALAX / GLYCOLAX) packet Take 8.5 g by mouth daily.   . potassium chloride SA (KLOR-CON M20) 20 MEQ tablet Take 3 tablets (60 mEq total) by mouth daily.  Marland Kitchen Propylene Glycol (SYSTANE BALANCE OP) Place 1 drop into the left eye daily as needed (dry eyes). Uses Systane gel apply to the eye as needed   . rosuvastatin (CRESTOR) 20 MG tablet TAKE 1  TABLET BY MOUTH EVERY DAY (Patient taking differently: Take 20 mg by mouth at bedtime. )  . Semaglutide, 1 MG/DOSE, (OZEMPIC, 1 MG/DOSE,) 2 MG/1.5ML SOPN Inject 1 mg into the skin once a week. (Patient taking differently: Inject 1 mg into the skin every Saturday. )  . traMADol (ULTRAM) 50 MG tablet TAKE 1 TABLET BY MOUTH TWICE A DAY AS NEEDED FOR MODERATE PAIN (Patient taking differently: Take 50 mg by mouth 2 (two) times daily. )  . vitamin B-12 (CYANOCOBALAMIN) 1000 MCG tablet Take 1,000 mcg by mouth daily.  . [DISCONTINUED] AMBULATORY NON FORMULARY MEDICATION Ustep walker DX: G20 (Patient not taking: Reported on 10/08/2019)  . [DISCONTINUED] OZEMPIC, 1 MG/DOSE, 4 MG/3ML SOPN INJECT $RemoveBef'1MG'BMUurmEUVy$  SUBCUTANEOUSLY  ONCE A WEEK. (Patient not taking: Reported on 10/08/2019)  . [DISCONTINUED] potassium chloride SA (KLOR-CON M20) 20 MEQ tablet Take 3 tablets (60 mEq total) by mouth daily.   No facility-administered encounter medications on file as of 10/08/2019.    Objective:   PHYSICAL EXAMINATION:    VITALS:   Vitals:   10/08/19 1012  BP: 110/71  Pulse: 68  SpO2: 99%  Weight: 244 lb (110.7 kg)  Height: $Remove'5\' 9"'xRPslfU$  (1.753 m)    GEN:  The patient appears stated age and is in NAD. HEENT:  Normocephalic, atraumatic.  The mucous membranes are moist. The superficial temporal arteries are without ropiness or tenderness. CV:  RRR Lungs:  CTAB Neck/HEME:  There are no carotid bruits bilaterally. Skin: Surgical incision is healing well over the right chest.  Neurological examination:  Orientation: The patient is alert and oriented x3. Cranial nerves: There is good facial symmetry with facial hypomimia. The speech is fluent and dysarthric (baseline) he is hypophonic.Marland Kitchen Soft palate rises symmetrically and there is no tongue deviation. Hearing is intact to conversational tone. Sensation: Sensation is intact to light touch throughout Motor: Strength is at least antigravity x4.  Movement examination: Tone: There  is normal tone in the upper and lower extremities following program change (prior to programming there was some increased tone in the left upper extremity) Abnormal movements: None Coordination:  There is decremation with RAM's, mostly with foot taps on the left Gait and Station: The patient does not really ambulate  any longer, so we did not ambulate him today.  I have reviewed and interpreted the following labs independently    Chemistry      Component Value Date/Time   NA 142 09/25/2019 1432   NA 142 12/22/2016 1233   K 3.7 09/25/2019 1432   CL 100 09/25/2019 1432   CO2 31 09/25/2019 1432   BUN 19 09/25/2019 1432   BUN 18 07/01/2018 0000   CREATININE 1.36 (H) 09/25/2019 1432   CREATININE 2.13 06/25/2017 0000   CREATININE 1.53 (H) 05/22/2016 1106      Component Value Date/Time   CALCIUM 9.2 09/25/2019 1432   ALKPHOS 104 08/16/2018 0916   AST 8 08/16/2018 0916   ALT 8 08/16/2018 0916   BILITOT 0.5 08/16/2018 0916       Lab Results  Component Value Date   WBC 6.2 09/25/2019   HGB 13.9 09/25/2019   HCT 43.7 09/25/2019   MCV 86.9 09/25/2019   PLT 195 09/25/2019    Lab Results  Component Value Date   TSH 0.79 07/01/2019     Total time spent on today's visit was 20 minutes, including both face-to-face time and nonface-to-face time.  Time included that spent on review of records (prior notes available to me/labs/imaging if pertinent), discussing treatment and goals, answering patient's questions and coordinating care.  This was not inclusive of DBS time.  Most of the time was spent in DBS programming, which was on a separate programming procedural note.  Cc:  Tonia Ghent, MD

## 2019-10-07 ENCOUNTER — Other Ambulatory Visit: Payer: Self-pay | Admitting: *Deleted

## 2019-10-07 DIAGNOSIS — I509 Heart failure, unspecified: Secondary | ICD-10-CM

## 2019-10-07 MED ORDER — POTASSIUM CHLORIDE CRYS ER 20 MEQ PO TBCR: 60.0000 meq | EXTENDED_RELEASE_TABLET | Freq: Every day | ORAL | 1 refills | Status: DC

## 2019-10-08 ENCOUNTER — Ambulatory Visit (INDEPENDENT_AMBULATORY_CARE_PROVIDER_SITE_OTHER): Payer: Medicare Other | Admitting: Neurology

## 2019-10-08 ENCOUNTER — Encounter: Payer: Self-pay | Admitting: Neurology

## 2019-10-08 ENCOUNTER — Other Ambulatory Visit: Payer: Self-pay

## 2019-10-08 DIAGNOSIS — I255 Ischemic cardiomyopathy: Secondary | ICD-10-CM

## 2019-10-08 DIAGNOSIS — G20 Parkinson's disease: Secondary | ICD-10-CM

## 2019-10-08 NOTE — Procedures (Signed)
DBS Programming was performed.    Manufacturer of DBS device: [employer]  Total time spent programming was 60 minutes.  Device was confirmed to be on.  Soft start was confirmed to be on.  Impedences were checked and were within normal limits.  Battery was checked and was determined to be new battery - just changed - but upon interrogation, it was noted to only have 1 year 10 months estimated life left.  Therefore, a new program was set into place, and at the end of programming, the new programming estimated 5 years 6 months of battery remaining on program 2.  Final settings were as follows with Group B active:   Active Contact Amplitude  PW (ms) Frequency (hz) Side Effects Battery  Left Brain        09/08/19 1-2+ 4.4V 90 170  2.59  10/08/19             Group A 1-2+ 4.67mA 90 170         Group B 0-1-2-C+ 1.1 80 125     0- (1.1)1-(1.0)2-(0.6)                               Right Brain        09/08/19 4-7+ 4.5V 90 170    10/08/19             Group A 4-7+ 2.88mA 90 170         Group B 4-7-C+ 2.5 60 125     4-(2.5)7-(0.6)C+                        Pt and wife shown how to use new battery programmer and demonstrated use/understanding back to [place].

## 2019-10-15 ENCOUNTER — Other Ambulatory Visit: Payer: Self-pay | Admitting: Family Medicine

## 2019-10-15 NOTE — Telephone Encounter (Signed)
Refill request Tramadol Last refill 07/17/19 #60/2 Last office visit 08/29/19

## 2019-10-15 NOTE — Telephone Encounter (Signed)
Sent. Thanks.   

## 2019-10-22 ENCOUNTER — Ambulatory Visit (INDEPENDENT_AMBULATORY_CARE_PROVIDER_SITE_OTHER): Payer: Medicare Other

## 2019-10-22 DIAGNOSIS — I255 Ischemic cardiomyopathy: Secondary | ICD-10-CM | POA: Diagnosis not present

## 2019-10-22 LAB — CUP PACEART REMOTE DEVICE CHECK
Battery Remaining Longevity: 44 mo
Battery Voltage: 2.96 V
Brady Statistic AP VP Percent: 0 %
Brady Statistic AP VS Percent: 0.73 %
Brady Statistic AS VP Percent: 0.06 %
Brady Statistic AS VS Percent: 99.2 %
Brady Statistic RA Percent Paced: 0.74 %
Brady Statistic RV Percent Paced: 0.06 %
Date Time Interrogation Session: 20211013093430
HighPow Impedance: 68 Ohm
Implantable Lead Implant Date: 20151105
Implantable Lead Implant Date: 20151105
Implantable Lead Location: 753859
Implantable Lead Location: 753860
Implantable Lead Model: 5076
Implantable Pulse Generator Implant Date: 20151105
Lead Channel Impedance Value: 399 Ohm
Lead Channel Impedance Value: 437 Ohm
Lead Channel Impedance Value: 494 Ohm
Lead Channel Pacing Threshold Amplitude: 0.375 V
Lead Channel Pacing Threshold Amplitude: 1 V
Lead Channel Pacing Threshold Pulse Width: 0.4 ms
Lead Channel Pacing Threshold Pulse Width: 0.4 ms
Lead Channel Sensing Intrinsic Amplitude: 18.125 mV
Lead Channel Sensing Intrinsic Amplitude: 18.125 mV
Lead Channel Sensing Intrinsic Amplitude: 2.125 mV
Lead Channel Sensing Intrinsic Amplitude: 2.125 mV
Lead Channel Setting Pacing Amplitude: 2 V
Lead Channel Setting Pacing Amplitude: 2.5 V
Lead Channel Setting Pacing Pulse Width: 0.4 ms
Lead Channel Setting Sensing Sensitivity: 0.3 mV

## 2019-10-27 NOTE — Progress Notes (Signed)
Remote ICD transmission.   

## 2019-10-27 NOTE — Progress Notes (Signed)
HPI: FU coronary artery disease status post bypass and graft, Parkinson's, diabetes and hypertension as well as hyperlipidemia. Carotid Dopplers in January 2005 showed 0-39% stenosis. Last nuclear study 6/15 showed EF 41, prior MI; no ischemia. Cardiac catheterization September 2015 showed a pulmonary Wedge pressure of 16 and PA pressure 32/20. The LAD was occluded. No obstructive disease in the circumflex. The right coronary had severe diffuse distal disease. Saphenous vein graft to the PDA was patent. Saphenous vein graft to diagonal patent. LIMA to the mid LAD patent. Medical therapy recommended. Had ICD placed November 2015.  Echocardiogram May 2021 showed ejection fraction 40 to 45% with akinesis of the anteroseptal wall and apex.  Patient noted to have atrial fibrillation on device interrogation previously.  He was started on anticoagulation.  He is asymptomatic with his episodes.  Since last seen,he denies dyspnea, chest pain, palpitations or syncope.  He is falling frequently.  Current Outpatient Medications  Medication Sig Dispense Refill   acetaminophen (TYLENOL) 500 MG tablet Take 1,000 mg by mouth at bedtime as needed for mild pain or headache.      albuterol (VENTOLIN HFA) 108 (90 Base) MCG/ACT inhaler Inhale 2 puffs into the lungs every 6 (six) hours as needed for wheezing or shortness of breath. 18 g 5   allopurinol (ZYLOPRIM) 100 MG tablet TAKE 1 & 1/2 TABLET BY MOUTH DAILY (Patient taking differently: Take 150 mg by mouth daily. ) 135 tablet 3   apixaban (ELIQUIS) 5 MG TABS tablet Take 1 tablet (5 mg total) by mouth 2 (two) times daily. 60 tablet 3   carbidopa-levodopa (SINEMET CR) 50-200 MG tablet TAKE 1 TABLET BY MOUTH EVERYDAY AT BEDTIME (Patient taking differently: Take 1 tablet by mouth at bedtime. ) 90 tablet 1   Carbidopa-Levodopa ER (SINEMET CR) 25-100 MG tablet controlled release TAKE 2 TABLETS AT 7AM,THEN 2TABLETS AT 11AM,THEN 2 TABS AT 3PM AND 1TAB AT 7PM,&1TAB  AT BEDTIME (Patient taking differently: Take 1-2 tablets by mouth See admin instructions. TAKE 2 TABLETS AT 7AM,THEN 2 TABLETS AT 11AM,THEN 2 TABS AT 3PM, 1TAB AT 7PM) 720 tablet 1   Cholecalciferol (VITAMIN D) 50 MCG (2000 UT) tablet Take 2,000 Units by mouth daily.      colchicine 0.6 MG tablet Take 1 tablet (0.6 mg total) by mouth 2 (two) times daily as needed (gout). (Patient taking differently: Take 0.6 mg by mouth as needed (gout). ) 30 tablet 1   dorzolamide-timolol (COSOPT) 22.3-6.8 MG/ML ophthalmic solution Place 1 drop into the right eye 2 (two) times daily.     Fluticasone-Salmeterol (WIXELA INHUB) 250-50 MCG/DOSE AEPB Inhale 1 puff into the lungs 2 (two) times daily.     FREESTYLE LITE test strip USE TO CHECK BLOOD SUGAR 4 TIMES DAILY. 150 strip 3   furosemide (LASIX) 80 MG tablet TAKE 1 & 1/2 TABLETS BY MOUTH DAILY (Patient taking differently: Take 120 mg by mouth daily. Pt takes 1 tablet in the morning and 1/2 tablet at night.) 135 tablet 1   gabapentin (NEURONTIN) 100 MG capsule 2 tabs in the AM and 4 in the PM. (Patient taking differently: Take 200-400 mg by mouth See admin instructions. Take 200 mg in the morning and 400 mg at night) 540 capsule 1   glucose blood (ONETOUCH VERIO) test strip USE AS DIRECTED TO CHECK BLOOD SUGAR TWICE A DAY. DX:E11.65 100 each 3   HUMALOG 100 UNIT/ML injection USE MAXIMUM 76 UNITS PER   DAY WITH V-GO PUMP (Patient taking  differently: Uses in omnipod insulin pump) 60 mL 2   Insulin Disposable Pump (OMNIPOD DASH SYSTEM) KIT 1 each by Does not apply route. Use Dash system for Continuous Blood Glucose Monitoring.     levothyroxine (SYNTHROID) 137 MCG tablet Take 1 tablet (137 mcg total) by mouth daily before breakfast. 90 tablet 1   metolazone (ZAROXOLYN) 2.5 MG tablet TAKE 1 TABLET BY MOUTH ONCE A WEEK. (Patient taking differently: Take 2.5 mg by mouth every Friday. )     metoprolol succinate (TOPROL-XL) 50 MG 24 hr tablet Take 50 mg by mouth at  bedtime. Take with or immediately following a meal.      mirabegron ER (MYRBETRIQ) 25 MG TB24 tablet Take 1 tablet (25 mg total) by mouth daily. 30 tablet 5   omega-3 acid ethyl esters (LOVAZA) 1 g capsule TAKE 2 CAPSULES (2GRAMS TOTAL) BY MOUTH TWICE DAILY. (Patient taking differently: Take 2 g by mouth 2 (two) times daily. ) 360 capsule 1   OVER THE COUNTER MEDICATION Apply 1 application topically daily as needed (pain). Theraworx Pain Cream      polyethylene glycol (MIRALAX / GLYCOLAX) packet Take 8.5 g by mouth daily.      potassium chloride SA (KLOR-CON M20) 20 MEQ tablet Take 3 tablets (60 mEq total) by mouth daily. 270 tablet 1   Propylene Glycol (SYSTANE BALANCE OP) Place 1 drop into the left eye daily as needed (dry eyes). Uses Systane gel apply to the eye as needed      rosuvastatin (CRESTOR) 20 MG tablet TAKE 1 TABLET BY MOUTH EVERY DAY (Patient taking differently: Take 20 mg by mouth at bedtime. ) 90 tablet 3   Semaglutide, 1 MG/DOSE, (OZEMPIC, 1 MG/DOSE,) 2 MG/1.5ML SOPN Inject 1 mg into the skin once a week. (Patient taking differently: Inject 1 mg into the skin every Saturday. ) 3 pen 1   traMADol (ULTRAM) 50 MG tablet Take 1 tablet (50 mg total) by mouth 2 (two) times daily. 60 tablet 2   vitamin B-12 (CYANOCOBALAMIN) 1000 MCG tablet Take 1,000 mcg by mouth daily.     No current facility-administered medications for this visit.     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   Dysrhythmia    a-fib   GERD (gastroesophageal  reflux disease)    Glaucoma    right eye   History of kidney stones    multiple   HLD (hyperlipidemia)    HTN (hypertension)    pt denies 08/19/12   Hyperplasia, prostate    Hyperthyroidism    thyroid lobectomy   Hypothyroidism    MI (myocardial infarction) ([place])    Dr Stanford Breed 2000, x3vessels bypass   Nephrolithiasis    OSA (obstructive sleep apnea)    AHI-28,on CPAP, noncompliant with CPAP   Parkinson disease ([place])    1999   PONV (postoperative nausea and vomiting)    Restless legs  Shortness of breath    Hx: of at all times   Sleep apnea    does not use CPAP   Sleep apnea, organic    UTI (lower urinary tract infection) 09/15/12   Klebsiella   Ventral hernia    Walker as ambulation aid    also uses wheelchair at home/when going out    Past Surgical History:  Procedure Laterality Date   [place]   right total loss   BIOPSY  10/14/2018   Procedure: BIOPSY;  Surgeon: Jerene Bears, MD;  Location: [facility];  Service: Gastroenterology;;   CARDIAC CATHETERIZATION     CATARACT EXTRACTION W/ INTRAOCULAR LENS IMPLANT     Hx: of right eye   CATARACT EXTRACTION W/ INTRAOCULAR LENS IMPLANT Left 2018   COLONOSCOPY N/A 10/13/2014   Procedure: COLONOSCOPY;  Surgeon: Jerene Bears, MD;  Location: [facility];  Service: Gastroenterology;  Laterality: N/A;   COLONOSCOPY W/ BIOPSIES AND POLYPECTOMY     Hx: of   COLONOSCOPY WITH PROPOFOL N/A 10/14/2018   Procedure: COLONOSCOPY WITH PROPOFOL;  Surgeon: Jerene Bears, MD;  Location: [facility];  Service: Gastroenterology;  Laterality: N/A;   CORONARY ARTERY BYPASS GRAFT  2000   [name], MD   CORONARY STENT PLACEMENT  1998   DEEP BRAIN STIMULATOR PLACEMENT  2004   Right and left VIN stimulator placement (parkinsons)   EYE SURGERY     FINGER AMPUTATION     left pointer   IMPLANTABLE CARDIOVERTER DEFIBRILLATOR IMPLANT N/A 11/13/2013   Procedure: IMPLANTABLE  CARDIOVERTER DEFIBRILLATOR IMPLANT;  Surgeon: Evans Lance, MD;  Location[facility] LAB;  Service: Cardiovascular;  Laterality: N/A;   INSERT / REPLACE / REMOVE PACEMAKER     medtronic   LEFT AND RIGHT HEART CATHETERIZATION WITH CORONARY ANGIOGRAM N/A 09/24/2013   Procedure: LEFT AND RIGHT HEART CATHETERIZATION WITH CORONARY ANGIOGRAM;  Surgeon: Burnell Blanks, MD;  Location: [facility] CATH LAB;  Service: Cardiovascular;  Laterality: N/A;   LITHOTRIPSY     3 different times   MEDIAN STERNOTOMY  2000   POLYPECTOMY  10/14/2018   Procedure: POLYPECTOMY;  Surgeon: Jerene Bears, MD;  Location: [facility];  Service: Gastroenterology;;   PULSE GENERATOR IMPLANT Right 11/13/2017   Procedure: Right chest implantable pulse generator change;  Surgeon: Erline Levine, MD;  Location: [facility];  Service: Neurosurgery;  Laterality: Right;  Right chest implantable pulse generator change   SUBTHALAMIC STIMULATOR BATTERY REPLACEMENT N/A 09/05/2012   Procedure: Deep brain stimulator battery change;  Surgeon: Erline Levine, MD;  Location: [facility];  Service: Neurosurgery;  Laterality: N/A;  Deep brain stimulator battery change   SUBTHALAMIC STIMULATOR BATTERY REPLACEMENT N/A 06/10/2015   Procedure: Deep Brain stimulator battery change;  Surgeon: Erline Levine, MD;  Location: [place] [facility];  Service: Neurosurgery;  Laterality: N/A;   SUBTHALAMIC STIMULATOR BATTERY REPLACEMENT Right 09/25/2019   Procedure: Deep brain stimulator battery change;  Surgeon: Erline Levine, MD;  Location: [facility];  Service: Neurosurgery;  Laterality: Right;   TONSILLECTOMY      Social History   Socioeconomic History   Marital status: Married    Spouse name: [name]   Number of children: 2   Years of education: 12   Highest education level: High school graduate  Occupational History   Occupation: DISABLED    Comment: [employer]  Tobacco Use   Smoking status: Never Smoker   Smokeless tobacco: Never Used    Vaping Use  Vaping Use: Never used  Substance and Sexual Activity   Alcohol use: Not Currently   Drug use: No   Sexual activity: Not Currently  Other Topics Concern   Not on file  Social History Narrative   From [facility]   Retired/disability Clinical research associate   Likes to fish.     Married 1972   3 kids   [place] fan   Social Determinants of Radio broadcast assistant Strain:    Difficulty of Paying Living Expenses: Not on file  Food Insecurity:    Worried About Charity fundraiser in the Last Year: Not on file   [employer] of Food in the Last Year: Not on file  Transportation Needs:    Film/video editor (Medical): Not on file   Lack of Transportation (Non-Medical): Not on file  Physical Activity:    Days of Exercise per Week: Not on file   Minutes of Exercise per Session: Not on file  Stress:    Feeling of Stress : Not on file  Social Connections:    Frequency of Communication with Friends and Family: Not on file   Frequency of Social Gatherings with Friends and Family: Not on file   Attends Religious Services: Not on file   Active Member of Clubs or Organizations: Not on file   Attends Archivist Meetings: Not on file   Marital Status: Not on file  Intimate Partner Violence:    Fear of Current or Ex-Partner: Not on file   Emotionally Abused: Not on file   Physically Abused: Not on file   Sexually Abused: Not on file    Family History  Problem Relation Age of Onset   Aneurysm Mother    Alcoholism Father    HIV/AIDS Brother 40       AIDS   Healthy Sister    Healthy Child    Peripheral vascular disease Other    Arthritis Other    Healthy Sister    Healthy Child    Healthy Child    Diabetes Neg Hx    Heart disease Neg Hx    Colon cancer Neg Hx    Prostate cancer Neg Hx     ROS: no fevers or chills, productive cough, hemoptysis, dysphasia, odynophagia, melena, hematochezia, dysuria, hematuria,  rash, seizure activity, orthopnea, PND, pedal edema, claudication. Remaining systems are negative.  Physical Exam: Well-developed well-nourished in no acute distress.  Skin is warm and dry.  HEENT is normal.  Neck is supple.  Chest is clear to auscultation with normal expansion.  Cardiovascular exam is regular rate and rhythm.  Abdominal exam nontender or distended. No masses palpated. Extremities show no edema. neuro resting tremor noted.  A/P  1 coronary artery disease-patient denies recurrent chest pain.  Plan to continue medical therapy with statin.  Resume aspirin 81 mg daily.  2 hypertension-patient's blood pressure is controlled.  Continue present medications and follow.  3 hyperlipidemia-continue statin.  4 chronic combined systolic/diastolic congestive heart failure-he is euvolemic today on examination.  Continue diuretic at present dose.  5 ischemic cardiomyopathy-continue beta-blocker.  Patient was having difficulties with low blood pressure previously and we therefore have not added ACE inhibitor, ARB or hydralazine/nitrates.  6 ICD-Per electrophysiology.  7 paroxysmal atrial fibrillation-patient is essentially asymptomatic.  Continue beta-blocker for rate control if atrial fibrillation recurs.  He has multiple embolic risk factors.  However he falls at least once monthly.  Most recently he fell backwards and knocked a hole in  his wall.  I therefore feel the risk of apixaban outweighs the benefit.  We will discontinue and instead treat with aspirin for his coronary disease.  Kirk Ruths, MD

## 2019-10-28 ENCOUNTER — Ambulatory Visit (INDEPENDENT_AMBULATORY_CARE_PROVIDER_SITE_OTHER): Payer: Medicare Other | Admitting: Primary Care

## 2019-10-28 ENCOUNTER — Encounter: Payer: Self-pay | Admitting: Primary Care

## 2019-10-28 ENCOUNTER — Other Ambulatory Visit: Payer: Self-pay

## 2019-10-28 VITALS — BP 118/74 | HR 73 | Temp 98.6°F | Ht 69.0 in | Wt 244.0 lb

## 2019-10-28 DIAGNOSIS — L989 Disorder of the skin and subcutaneous tissue, unspecified: Secondary | ICD-10-CM | POA: Diagnosis not present

## 2019-10-28 DIAGNOSIS — I255 Ischemic cardiomyopathy: Secondary | ICD-10-CM

## 2019-10-28 DIAGNOSIS — Z23 Encounter for immunization: Secondary | ICD-10-CM

## 2019-10-28 NOTE — Patient Instructions (Addendum)
Keep the site as dry as possible. Stop using peroxide.  Please notify Dr. Damita Dunnings if the spot does not heal, you develop redness to the skin surrounding the site, you notice drainage that is green/yellow, you run fevers.  It was a pleasure meeting you!    Influenza (Flu) Vaccine (Inactivated or Recombinant): What You Need to Know 1. Why get vaccinated? Influenza vaccine can prevent influenza (flu). Flu is a contagious disease that spreads around the [place] every year, usually between October and May. Anyone can get the flu, but it is more dangerous for some people. Infants and young children, people 68 years of age and older, pregnant women, and people with certain health conditions or a weakened immune system are at greatest risk of flu complications. Pneumonia, bronchitis, sinus infections and ear infections are examples of flu-related complications. If you have a medical condition, such as heart disease, cancer or diabetes, flu can make it worse. Flu can cause fever and chills, sore throat, muscle aches, fatigue, cough, headache, and runny or stuffy nose. Some people may have vomiting and diarrhea, though this is more common in children than adults. Each year thousands of people in the [place] die from flu, and many more are hospitalized. Flu vaccine prevents millions of illnesses and flu-related visits to the doctor each year. 2. Influenza vaccine CDC recommends everyone 68 months of age and older get vaccinated every flu season. Children 6 months through 68 years of age may need 2 doses during a single flu season. Everyone else needs only 1 dose each flu season. It takes about 2 weeks for protection to develop after vaccination. There are many flu viruses, and they are always changing. Each year a new flu vaccine is made to protect against three or four viruses that are likely to cause disease in the upcoming flu season. Even when the vaccine doesn't exactly match these viruses, it  may still provide some protection. Influenza vaccine does not cause flu. Influenza vaccine may be given at the same time as other vaccines. 3. Talk with your health care provider Tell your vaccine provider if the person getting the vaccine:  Has had an allergic reaction after a previous dose of influenza vaccine, or has any severe, life-threatening allergies.  Has ever had Guillain-Barr Syndrome (also called GBS). In some cases, your health care provider may decide to postpone influenza vaccination to a future visit. People with minor illnesses, such as a cold, may be vaccinated. People who are moderately or severely ill should usually wait until they recover before getting influenza vaccine. Your health care provider can give you more information. 4. Risks of a vaccine reaction  Soreness, redness, and swelling where shot is given, fever, muscle aches, and headache can happen after influenza vaccine.  There may be a very small increased risk of Guillain-Barr Syndrome (GBS) after inactivated influenza vaccine (the flu shot). Young children who get the flu shot along with pneumococcal vaccine (PCV13), and/or DTaP vaccine at the same time might be slightly more likely to have a seizure caused by fever. Tell your health care provider if a child who is getting flu vaccine has ever had a seizure. People sometimes faint after medical procedures, including vaccination. Tell your provider if you feel dizzy or have vision changes or ringing in the ears. As with any medicine, there is a very remote chance of a vaccine causing a severe allergic reaction, other serious injury, or death. 5. What if there is a serious problem? An  allergic reaction could occur after the vaccinated person leaves the clinic. If you see signs of a severe allergic reaction (hives, swelling of the face and throat, difficulty breathing, a fast heartbeat, dizziness, or weakness), call 9-1-1 and get the person to the nearest  hospital. For other signs that concern you, call your health care provider. Adverse reactions should be reported to the Vaccine Adverse Event Reporting System (VAERS). Your health care provider will usually file this report, or you can do it yourself. Visit the VAERS website at www.vaers.SamedayNews.es or call 272-265-8119.VAERS is only for reporting reactions, and VAERS staff do not give medical advice. 6. The National Vaccine Injury Compensation Program The [employer] Vaccine Injury Compensation Program (VICP) is a federal program that was created to compensate people who may have been injured by certain vaccines. Visit the VICP website at GoldCloset.com.ee or call 848-785-4176 to learn about the program and about filing a claim. There is a time limit to file a claim for compensation. 7. How can I learn more?  Ask your healthcare provider.  Call your local or state health department.  Contact the Centers for Disease Control and Prevention (CDC): ? Call 270-708-1423 (1-800-CDC-INFO) or ? Visit CDC's https://gibson.com/ Vaccine Information Statement (Interim) Inactivated Influenza Vaccine (08/23/2017) This information is not intended to replace advice given to you by your health care provider. Make sure you discuss any questions you have with your health care provider. Document Revised: 04/16/2018 Document Reviewed: 08/27/2017 Elsevier Patient Education  [place].

## 2019-10-28 NOTE — Assessment & Plan Note (Signed)
More maceration than lesion, but apparent today on exam, see picture attached to PE.  No signs of infection. Discussed the importance of keep the site dry. Bandage applied today. Stop peroxide.  Return precautions provided.

## 2019-10-28 NOTE — Progress Notes (Signed)
Subjective:    Patient ID: Henry [name], male    DOB: [birth year], 69 y.o.   MRN: [id number]  HPI  This visit occurred during the SARS-CoV-2 public health emergency.  Safety protocols were in place, including screening questions prior to the visit, additional usage of staff PPE, and extensive cleaning of exam room while observing appropriate contact time as indicated for disinfecting solutions.   [name] is a 69 year old male patient of Dr. Damita Dunnings with a significant medical history including hypertension, CAD, CHF, OSA, type 2 diabetes, parkinson's disease, neuropathy, gout, thrombocytopenia who presents today with a chief complaint of abscess.  He has a history of this skin break down in the past, has seen Dr. Damita Dunnings for this with last visit being in August 2021. During that visit the site did not appear infectious, was bandaged up and encouraged to keep the site dry.  Two evenings ago he rubbed his hand across his lower mid abdomen and noticed blood, puss, yellow colored discharge from the site. His wife has been cleaning the site daily since then with peroxide, also attempting to apply a dressing but nothing stays in place.  He endorses chronic moisture to the site, very tough to keep dry. He denies fevers, pain, surrounding erythema.     Review of Systems  Constitutional: Negative for fever.  Skin: Positive for color change and wound.       Past Medical History:  Diagnosis Date  . AICD (automatic cardioverter/defibrillator) present    Dr Lovena Le office visit yearly, MDT  medtronic   . Arthritis    cane  . Asthma   . Benign neoplasm of colon   . CAD (coronary artery disease)   . Cardiomyopathy   . Cataract    removed  . Complication of anesthesia    pt states that he got a rash  . Constipation   . 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  . Dysrhythmia    a-fib  . GERD (gastroesophageal  reflux disease)   . Glaucoma    right eye  . History of kidney stones    multiple  . HLD (hyperlipidemia)   . HTN (hypertension)    pt denies 08/19/12  . Hyperplasia, prostate   . Hyperthyroidism    thyroid lobectomy  . Hypothyroidism   . MI (myocardial infarction) [facility])    Dr Stanford Breed 2000, x3vessels bypass  . Nephrolithiasis   . OSA (obstructive sleep apnea)    AHI-28,on CPAP, noncompliant with CPAP  . Parkinson disease ([place])    1999  . PONV (postoperative nausea and vomiting)   . Restless legs   . Shortness of breath    Hx: of at all times  . Sleep apnea    does not use CPAP  . Sleep apnea, organic   . UTI (lower urinary tract infection) 09/15/12   Klebsiella  . Ventral hernia   . Walker as ambulation aid    also uses wheelchair at home/when going out     Social History   Socioeconomic History  . Marital status: Married    Spouse name: Henry [name]  . Number of children: 2  . Years of education: 16  . Highest education level: High school graduate  Occupational History  . 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 Smoking status: Never Smoker  . Smokeless  tobacco: Never Used  Vaping Use  . Vaping Use: Never used  Substance and Sexual Activity  . Alcohol use: Not Currently  . Drug use: No  . Sexual activity: Not Currently  Other Topics Concern  . Not on file  Social History Narrative   From [facility]   Retired/disability Clinical research associate   Likes to fish.     Married 1972   3 kids   [place] fan   Social Determinants of Health   Financial Resource Strain:   . Difficulty of Paying Living Expenses: Not on file  Food Insecurity:   . Worried About Charity fundraiser in the Last Year: Not on file  . Ran Out of Food in the Last Year: Not on file  Transportation Needs:   . Lack of Transportation (Medical): Not on file  . Lack of Transportation (Non-Medical): Not on file  Physical Activity:   . Days of Exercise per Week:  Not on file  . Minutes of Exercise per Session: Not on file  Stress:   . Feeling of Stress : Not on file  Social Connections:   . Frequency of Communication with Friends and Family: Not on file  . Frequency of Social Gatherings with Friends and Family: Not on file  . Attends Religious Services: Not on file  . Active Member of Clubs or Organizations: Not on file  . Attends Archivist Meetings: Not on file  . Marital Status: Not on file  Intimate Partner Violence:   . Fear of Current or Ex-Partner: Not on file  . Emotionally Abused: Not on file  . Physically Abused: Not on file  . Sexually Abused: Not on file    Past Surgical History:  Procedure Laterality Date  . 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 BIOPSY  10/14/2018   Procedure: BIOPSY;  Surgeon: Jerene Bears, MD;  Location: [facility];  Service: Gastroenterology;;  . CARDIAC CATHETERIZATION    . CATARACT EXTRACTION W/ INTRAOCULAR LENS IMPLANT     Hx: of right eye  . CATARACT EXTRACTION W/ INTRAOCULAR LENS IMPLANT Left 2018  . COLONOSCOPY N/A 10/13/2014   Procedure: COLONOSCOPY;  Surgeon: Jerene Bears, MD;  Location: [facility];  Service: Gastroenterology;  Laterality: N/A;  . COLONOSCOPY W/ BIOPSIES AND POLYPECTOMY     Hx: of  . COLONOSCOPY WITH PROPOFOL N/A 10/14/2018   Procedure: COLONOSCOPY WITH PROPOFOL;  Surgeon: Jerene Bears, MD;  Location: [facility];  Service: Gastroenterology;  Laterality: N/A;  . CORONARY ARTERY BYPASS GRAFT  2000   [name], MD  . [place]  . DEEP BRAIN STIMULATOR PLACEMENT  2004   Right and left VIN stimulator placement (parkinsons)  . EYE SURGERY    . FINGER AMPUTATION     left pointer  . IMPLANTABLE CARDIOVERTER DEFIBRILLATOR IMPLANT N/A 11/13/2013   Procedure: IMPLANTABLE CARDIOVERTER DEFIBRILLATOR IMPLANT;  Surgeon: Evans Lance, MD;  Location: [facility] C[facility] LAB;  Service: Cardiovascular;  Laterality: N/A;  . INSERT / REPLACE / REMOVE  PACEMAKER     medtronic  . LEFT AND RIGHT HEART CATHETERIZATION WITH CORONARY ANGIOGRAM N/A 09/24/2013   Procedure: LEFT AND RIGHT HEART CATHETERIZATION WITH CORONARY ANGIOGRAM;  Surgeon: Burnell Blanks, MD;  Location: [facility] CATH LAB;  Service: Cardiovascular;  Laterality: N/A;  . LITHOTRIPSY     3 different times  . MEDIAN STERNOTOMY  2000  . POLYPECTOMY  10/14/2018   Procedure: POLYPECTOMY;  Surgeon: Hilarie Fredrickson,  Lajuan Lines, MD;  Location: [facility];  Service: Gastroenterology;;  . PULSE GENERATOR IMPLANT Right 11/13/2017   Procedure: Right chest implantable pulse generator change;  Surgeon: Erline Levine, MD;  Location: [place];  Service: Neurosurgery;  Laterality: Right;  Right chest implantable pulse generator change  . SUBTHALAMIC STIMULATOR BATTERY REPLACEMENT N/A 09/05/2012   Procedure: Deep brain stimulator battery change;  Surgeon: Erline Levine, MD;  Location: [facility];  Service: Neurosurgery;  Laterality: N/A;  Deep brain stimulator battery change  . SUBTHALAMIC STIMULATOR BATTERY REPLACEMENT N/A 06/10/2015   Procedure: Deep Brain stimulator battery change;  Surgeon: Erline Levine, MD;  Location: [place] [facility];  Service: Neurosurgery;  Laterality: N/A;  . SUBTHALAMIC STIMULATOR BATTERY REPLACEMENT Right 09/25/2019   Procedure: Deep brain stimulator battery change;  Surgeon: Erline Levine, MD;  Location: [place];  Service: Neurosurgery;  Laterality: Right;  . TONSILLECTOMY      Family History  Problem Relation Age of Onset  . Aneurysm Mother   . Alcoholism Father   . HIV/AIDS Brother 39       AIDS  . Healthy Sister   . Healthy Child   . Peripheral vascular disease Other   . Arthritis Other   . Healthy Sister   . Healthy Child   . Healthy Child   . Diabetes Neg Hx   . Heart disease Neg Hx   . Colon cancer Neg Hx   . Prostate cancer Neg Hx     Allergies  Allergen Reactions  . Penicillins Anaphylaxis and Rash    Because of a history of documented adverse serious drug  reaction;Medi Alert bracelet  is recommended PATIENT HAS HAD A PCN REACTION WITH IMMEDIATE RASH, FACIAL/TONGUE/THROAT SWELLING, SOB, OR LIGHTHEADEDNESS WITH HYPOTENSION:  #  #  YES  #  #  Has patient had a PCN reaction causing severe rash involving mucus membranes or skin necrosis: unknown Has patient had a PCN reaction that required hospitalization NO Has patient had a PCN reaction occurring within the last 10 years: NO  . Klonopin [Clonazepam] Other (See Comments)    agitation  . Peanut-Containing Drug Products Cough  . Watermelon [Citrullus Vulgaris] Other (See Comments) and Cough    Tickle in throat    Current Outpatient Medications on File Prior to Visit  Medication Sig Dispense Refill  . acetaminophen (TYLENOL) 500 MG tablet Take 1,000 mg by mouth at bedtime as needed for mild pain or headache.     . albuterol (VENTOLIN HFA) 108 (90 Base) MCG/ACT inhaler Inhale 2 puffs into the lungs every 6 (six) hours as needed for wheezing or shortness of breath. 18 g 5  . allopurinol (ZYLOPRIM) 100 MG tablet TAKE 1 & 1/2 TABLET BY MOUTH DAILY (Patient taking differently: Take 150 mg by mouth daily. ) 135 tablet 3  . apixaban (ELIQUIS) 5 MG TABS tablet Take 1 tablet (5 mg total) by mouth 2 (two) times daily. 60 tablet 3  . carbidopa-levodopa (SINEMET CR) 50-200 MG tablet TAKE 1 TABLET BY MOUTH EVERYDAY AT BEDTIME (Patient taking differently: Take 1 tablet by mouth at bedtime. ) 90 tablet 1  . Carbidopa-Levodopa ER (SINEMET CR) 25-100 MG tablet controlled release TAKE 2 TABLETS AT 7AM,THEN 2TABLETS AT 11AM,THEN 2 TABS AT 3PM AND 1TAB AT 7PM,&1TAB AT BEDTIME (Patient taking differently: Take 1-2 tablets by mouth See admin instructions. TAKE 2 TABLETS AT 7AM,THEN 2 TABLETS AT 11AM,THEN 2 TABS AT 3PM, 1TAB AT 7PM) 720 tablet 1  . Cholecalciferol (VITAMIN D) 50 MCG (  2000 UT) tablet Take 2,000 Units by mouth daily.     . colchicine 0.6 MG tablet Take 1 tablet (0.6 mg total) by mouth 2 (two) times daily as  needed (gout). (Patient taking differently: Take 0.6 mg by mouth as needed (gout). ) 30 tablet 1  . dorzolamide-timolol (COSOPT) 22.3-6.8 MG/ML ophthalmic solution Place 1 drop into the right eye 2 (two) times daily.    . Fluticasone-Salmeterol (WIXELA INHUB) 250-50 MCG/DOSE AEPB Inhale 1 puff into the lungs 2 (two) times daily.    Marland Kitchen FREESTYLE LITE test strip USE TO CHECK BLOOD SUGAR 4 TIMES DAILY. 150 strip 3  . furosemide (LASIX) 80 MG tablet TAKE 1 & 1/2 TABLETS BY MOUTH DAILY (Patient taking differently: Take 120 mg by mouth daily. ) 135 tablet 1  . gabapentin (NEURONTIN) 100 MG capsule 2 tabs in the AM and 4 in the PM. (Patient taking differently: Take 200-400 mg by mouth See admin instructions. Take 200 mg in the morning and 400 mg at night) 540 capsule 1  . glucose blood (ONETOUCH VERIO) test strip USE AS DIRECTED TO CHECK BLOOD SUGAR TWICE A DAY. DX:E11.65 100 each 3  . HUMALOG 100 UNIT/ML injection USE MAXIMUM 76 UNITS PER   DAY WITH V-GO PUMP (Patient taking differently: Uses in omnipod insulin pump) 60 mL 2  . Insulin Disposable Pump (OMNIPOD DASH SYSTEM) KIT 1 each by Does not apply route. Use Dash system for Continuous Blood Glucose Monitoring.    Marland Kitchen levothyroxine (SYNTHROID) 137 MCG tablet Take 1 tablet (137 mcg total) by mouth daily before breakfast. 90 tablet 1  . metolazone (ZAROXOLYN) 2.5 MG tablet TAKE 1 TABLET BY MOUTH ONCE A WEEK. (Patient taking differently: Take 2.5 mg by mouth every Friday. )    . metoprolol succinate (TOPROL-XL) 50 MG 24 hr tablet Take 50 mg by mouth at bedtime. Take with or immediately following a meal.     . mirabegron ER (MYRBETRIQ) 25 MG TB24 tablet Take 1 tablet (25 mg total) by mouth daily. 30 tablet 5  . omega-3 acid ethyl esters (LOVAZA) 1 g capsule TAKE 2 CAPSULES (2GRAMS TOTAL) BY MOUTH TWICE DAILY. (Patient taking differently: Take 2 g by mouth 2 (two) times daily. ) 360 capsule 1  . OVER THE COUNTER MEDICATION Apply 1 application topically daily as  needed (pain). Theraworx Pain Cream     . polyethylene glycol (MIRALAX / GLYCOLAX) packet Take 8.5 g by mouth daily.     . potassium chloride SA (KLOR-CON M20) 20 MEQ tablet Take 3 tablets (60 mEq total) by mouth daily. 270 tablet 1  . Propylene Glycol (SYSTANE BALANCE OP) Place 1 drop into the left eye daily as needed (dry eyes). Uses Systane gel apply to the eye as needed     . rosuvastatin (CRESTOR) 20 MG tablet TAKE 1 TABLET BY MOUTH EVERY DAY (Patient taking differently: Take 20 mg by mouth at bedtime. ) 90 tablet 3  . Semaglutide, 1 MG/DOSE, (OZEMPIC, 1 MG/DOSE,) 2 MG/1.5ML SOPN Inject 1 mg into the skin once a week. (Patient taking differently: Inject 1 mg into the skin every Saturday. ) 3 pen 1  . traMADol (ULTRAM) 50 MG tablet Take 1 tablet (50 mg total) by mouth 2 (two) times daily. 60 tablet 2  . vitamin B-12 (CYANOCOBALAMIN) 1000 MCG tablet Take 1,000 mcg by mouth daily.     No current facility-administered medications on file prior to visit.    BP 118/74   Pulse 73   Temp  98.6 F (37 C) (Temporal)   Ht _0  (1.753 m)   Wt 244 lb (110.7 kg)   SpO2 98%   BMI 36.03 kg/m    Objective:   Physical Exam Pulmonary:     Effort: Pulmonary effort is normal.  Skin:    General: Skin is warm and dry.     Comments: 1 cm circular, superficial, skin maceration to upper mid pelvis under mid abdominal fold. No surrounding erythema. No purulent drainage. Non tender.  Neurological:     Mental Status: He is alert and oriented to person, place, and time.             Assessment & Plan:

## 2019-10-30 ENCOUNTER — Other Ambulatory Visit: Payer: Self-pay

## 2019-10-30 ENCOUNTER — Ambulatory Visit (INDEPENDENT_AMBULATORY_CARE_PROVIDER_SITE_OTHER): Payer: Medicare Other | Admitting: Cardiology

## 2019-10-30 ENCOUNTER — Encounter: Payer: Self-pay | Admitting: Cardiology

## 2019-10-30 VITALS — BP 98/62 | HR 72 | Ht 69.0 in | Wt 248.6 lb

## 2019-10-30 DIAGNOSIS — I4891 Unspecified atrial fibrillation: Secondary | ICD-10-CM | POA: Diagnosis not present

## 2019-10-30 DIAGNOSIS — I10 Essential (primary) hypertension: Secondary | ICD-10-CM | POA: Diagnosis not present

## 2019-10-30 DIAGNOSIS — I255 Ischemic cardiomyopathy: Secondary | ICD-10-CM

## 2019-10-30 DIAGNOSIS — I2510 Atherosclerotic heart disease of native coronary artery without angina pectoris: Secondary | ICD-10-CM

## 2019-10-30 MED ORDER — ASPIRIN EC 81 MG PO TBEC: 81.0000 mg | DELAYED_RELEASE_TABLET | Freq: Every day | ORAL | 3 refills | Status: DC

## 2019-10-30 MED ORDER — NITROGLYCERIN 0.4 MG SL SUBL: 0.4000 mg | SUBLINGUAL_TABLET | SUBLINGUAL | 12 refills | Status: DC | PRN

## 2019-10-30 NOTE — Patient Instructions (Signed)
Medication Instructions:   STOP ELIQUIS  START ASPIRIN 81 MG ONCE DAILY  *If you need a refill on your cardiac medications before your next appointment, please call your pharmacy*   Follow-Up: At [facility], you and your health needs are our priority.  As part of our continuing mission to provide you with exceptional heart care, we have created designated Provider Care Teams.  These Care Teams include your primary Cardiologist (physician) and Advanced Practice Providers (APPs -  Physician Assistants and Nurse Practitioners) who all work together to provide you with the care you need, when you need it.  We recommend signing up for the patient portal called "MyChart".  Sign up information is provided on this After Visit Summary.  MyChart is used to connect with patients for Virtual Visits (Telemedicine).  Patients are able to view lab/test results, encounter notes, upcoming appointments, etc.  Non-urgent messages can be sent to your provider as well.   To learn more about what you can do with MyChart, go to [email or web address].    Your next appointment:   6 month(s)  The format for your next appointment:   In Person  Provider:   Kirk Ruths, MD

## 2019-11-01 ENCOUNTER — Other Ambulatory Visit: Payer: Self-pay | Admitting: Endocrinology

## 2019-11-09 ENCOUNTER — Other Ambulatory Visit: Payer: Self-pay | Admitting: Endocrinology

## 2019-12-08 ENCOUNTER — Other Ambulatory Visit: Payer: Self-pay | Admitting: Family Medicine

## 2019-12-08 NOTE — Telephone Encounter (Signed)
Pharmacy requests refill on: Myrbetriq ER 25 mg  LAST REFILL: 05/30/2019 (Q-30, R-5) LAST OV: 10/28/2019 NEXT OV: Not Scheduled  PHARMACY: [id number] [place], [place]

## 2019-12-10 ENCOUNTER — Telehealth: Payer: Self-pay | Admitting: Endocrinology

## 2019-12-10 NOTE — Telephone Encounter (Signed)
Please advise 

## 2019-12-10 NOTE — Telephone Encounter (Signed)
Pt called stating last labs drawn were at PCP 09/25/19. Will those labs be sufficient for visit with Dwyane Dee 12/19/19 or does pt need to come for labs prior to being seen with Dwyane Dee?

## 2019-12-10 NOTE — Telephone Encounter (Signed)
He can come without the labs being redrawn.  We will do an A1c in the office and urine microalbumin

## 2019-12-10 NOTE — Telephone Encounter (Signed)
Henry [name],  Can you call him back to let him know please?

## 2019-12-11 ENCOUNTER — Ambulatory Visit: Payer: Medicare Other | Admitting: Endocrinology

## 2019-12-14 ENCOUNTER — Other Ambulatory Visit: Payer: Self-pay | Admitting: Family Medicine

## 2019-12-15 NOTE — Telephone Encounter (Signed)
Pharmacy requests refill on: Metoprolol Succinate 50 mg 24 hr  LAST REFILL: 09/08/2019 LAST OV: 08/29/2019 NEXT OV: Not Scheduled  PHARMACY: [id number] [place], [place]

## 2019-12-19 ENCOUNTER — Ambulatory Visit (INDEPENDENT_AMBULATORY_CARE_PROVIDER_SITE_OTHER): Payer: Medicare Other | Admitting: Endocrinology

## 2019-12-19 ENCOUNTER — Other Ambulatory Visit: Payer: Self-pay

## 2019-12-19 ENCOUNTER — Encounter: Payer: Self-pay | Admitting: Endocrinology

## 2019-12-19 VITALS — BP 108/70 | HR 76 | Temp 97.9°F | Ht 69.0 in | Wt 251.0 lb

## 2019-12-19 DIAGNOSIS — E890 Postprocedural hypothyroidism: Secondary | ICD-10-CM

## 2019-12-19 DIAGNOSIS — I255 Ischemic cardiomyopathy: Secondary | ICD-10-CM | POA: Diagnosis not present

## 2019-12-19 DIAGNOSIS — Z8679 Personal history of other diseases of the circulatory system: Secondary | ICD-10-CM

## 2019-12-19 DIAGNOSIS — E1165 Type 2 diabetes mellitus with hyperglycemia: Secondary | ICD-10-CM | POA: Diagnosis not present

## 2019-12-19 LAB — BASIC METABOLIC PANEL
BUN: 22 mg/dL (ref 6–23)
CO2: 33 mEq/L — ABNORMAL HIGH (ref 19–32)
Calcium: 9.1 mg/dL (ref 8.4–10.5)
Chloride: 98 mEq/L (ref 96–112)
Creatinine, Ser: 1.53 mg/dL — ABNORMAL HIGH (ref 0.40–1.50)
GFR: 46.46 mL/min — ABNORMAL LOW (ref >60.00)
Glucose, Bld: 104 mg/dL — ABNORMAL HIGH (ref 70–99)
Potassium: 4.1 mEq/L (ref 3.5–5.1)
Sodium: 138 mEq/L (ref 135–145)

## 2019-12-19 LAB — POCT GLYCOSYLATED HEMOGLOBIN (HGB A1C): Hemoglobin A1C: 5.9 % — AB (ref 4.0–5.6)

## 2019-12-19 LAB — MICROALBUMIN / CREATININE URINE RATIO
Creatinine,U: 58.3 mg/dL
Microalb Creat Ratio: 1.2 mg/g (ref 0.0–30.0)
Microalb, Ur: <0.7 mg/dL (ref 0.0–1.9)

## 2019-12-19 LAB — TSH: TSH: 4.66 u[IU]/mL — ABNORMAL HIGH (ref 0.35–4.50)

## 2019-12-19 NOTE — Patient Instructions (Signed)
Keep up with BP and fluids with Farxiga  Reduce insulin and lasix to 1

## 2019-12-19 NOTE — Progress Notes (Signed)
Patient ID: Henry [name], male   DOB: 03-05-1950, 69 y.o.   MRN: [id number]           Reason for Appointment: Follow-up for Type 2 Diabetes   History of Present Illness:          Diagnosis: Type 2 diabetes mellitus, date of diagnosis: 2000      Past history:   Patient thinks he has been taking Amaryl for several years and probably metformin since onset also At some point he was changed from metformin to [facility] and was taking this since at least 2012 A1c had been higher in 2015 Since his A1c had been progressively higher with his regimen of Janumet and Amaryl he was started on Victoza in 01/2014 He was started on Lantus insulin in 5/16 because of persistent hyperglycemia especially fasting; was having readings as high as 293 Because of tendency to high postprandial readings and for control with Lantus he was switched to the V-go pump in  09/2014  Recent history:    INSULIN regimen: Omnipod insulin pump with Humalog insulin  Basal rate at midnight will be 1.9, 7 AM 2.4 and 11 AM 3.0,   Basal rate programs: Midnight = 1.9, 7 AM = 2.4, 11 a.m. = 3.0, total basal 59 units  Boluses 3-5 units for breakfast, 4-6 lunch and 3 -9 units for dinner  Non-insulin hypoglycemic drugs the patient is taking are: Ozempic 1 mg weekly  His A1c has been consistently over 8% in the past Now it is 5.9, previously 5.8   Current management, blood sugar patterns and problems identified:  His blood sugars have been consistently controlled and similar to his last visit  Continues to make improvement in his diet also with cutting back on starchy foods, snacks, desserts  Also not eating during the night  Blood sugars are being checked only when he is doing boluses mostly at breakfast and dinnertime  Not clear if he is sometimes skipping boluses at lunch or dinner and may not bolus if blood sugar is normal, history difficult to obtain  Sometimes however will end up bleeding a large snack at night  in addition to his 3 meals  Despite Ozempic he has gained weight progressively  No side effects with this  Also no hypoglycemia with lowest blood sugar 80  Hypoglycemia:   none  He has seen the dietitian in 04/2017  Not able to exercise because of back pain and difficulty walking       Side effects from medications have been: None Compliance with the medical regimen: Fair   Glucose monitoring:  Recently usually 3 times  a day         Glucometer:  Freestyle with the OmniPod PDM       Blood Glucose readings by download of pump    PRE-MEAL Fasting Lunch Dinner Bedtime Overall  Glucose range:  94-139   86-136  103-147   Mean/median: 119 119 115  119   POST-MEAL PC Breakfast PC Lunch PC Dinner  Glucose range:     Mean/median:   126   Previously:   PRE-MEAL Fasting Lunch Dinner Bedtime Overall  Glucose range:  98-153   95-158   90-158  Mean/median:  130  121  129   126   POST-MEAL PC Breakfast PC Lunch PC Dinner  Glucose range:    149  Mean/median:           Meals: 9 am , 2-3 pm and 6-7 pm  Dietician visit, most recent: 4/19               Weight history:   Wt Readings from Last 3 Encounters:  12/19/19 251 lb (113.9 kg)  10/30/19 248 lb 9.6 oz (112.8 kg)  10/28/19 244 lb (110.7 kg)    Glycemic control:   Lab Results  Component Value Date   HGBA1C 5.9 (A) 12/19/2019   HGBA1C 5.8 (A) 09/08/2019   HGBA1C 8.6 (H) 04/24/2019   Lab Results  Component Value Date   MICROALBUR <0.7 06/14/2018   LDLCALC 118 (H) 09/16/2015   CREATININE 1.36 (H) 09/25/2019    Lab Results  Component Value Date   FRUCTOSAMINE 359 (H) 07/01/2019   FRUCTOSAMINE 277 11/09/2017   FRUCTOSAMINE 350 (H) 03/29/2017     Other active problems: See review of systems      Allergies as of 12/19/2019      Reactions   Penicillins Anaphylaxis, Rash   Because of a history of documented adverse serious drug reaction;Medi Alert bracelet  is recommended PATIENT HAS HAD A PCN  REACTION WITH IMMEDIATE RASH, FACIAL/TONGUE/THROAT SWELLING, SOB, OR LIGHTHEADEDNESS WITH HYPOTENSION:  #  #  YES  #  #  Has patient had a PCN reaction causing severe rash involving mucus membranes or skin necrosis: unknown Has patient had a PCN reaction that required hospitalization NO Has patient had a PCN reaction occurring within the last 10 years: NO   Klonopin [clonazepam] Other (See Comments)   agitation   Peanut-containing Drug Products Cough   Watermelon [citrullus Vulgaris] Other (See Comments), Cough   Tickle in throat      Medication List       Accurate as of December 19, 2019 10:57 AM. If you have any questions, ask your nurse or doctor.        STOP taking these medications   metolazone 2.5 MG tablet Commonly known as: ZAROXOLYN Stopped by: Elayne Snare, MD     TAKE these medications   acetaminophen 500 MG tablet Commonly known as: TYLENOL Take 1,000 mg by mouth at bedtime as needed for mild pain or headache.   albuterol 108 (90 Base) MCG/ACT inhaler Commonly known as: VENTOLIN HFA Inhale 2 puffs into the lungs every 6 (six) hours as needed for wheezing or shortness of breath.   allopurinol 100 MG tablet Commonly known as: ZYLOPRIM TAKE 1 & 1/2 TABLET BY MOUTH DAILY What changed: See the new instructions.   aspirin EC 81 MG tablet Take 1 tablet (81 mg total) by mouth daily. Swallow whole.   Carbidopa-Levodopa ER 25-100 MG tablet controlled release Commonly known as: SINEMET CR TAKE 2 TABLETS AT 7AM,THEN 2TABLETS AT 11AM,THEN 2 TABS AT 3PM AND 1TAB AT 7PM,&1TAB AT BEDTIME What changed:   how much to take  how to take this  when to take this  additional instructions   carbidopa-levodopa 50-200 MG tablet Commonly known as: SINEMET CR TAKE 1 TABLET BY MOUTH EVERYDAY AT BEDTIME What changed: See the new instructions.   colchicine 0.6 MG tablet Take 1 tablet (0.6 mg total) by mouth 2 (two) times daily as needed (gout). What changed: when to take  this   dorzolamide-timolol 22.3-6.8 MG/ML ophthalmic solution Commonly known as: COSOPT Place 1 drop into the right eye 2 (two) times daily.   Fluticasone-Salmeterol 250-50 MCG/DOSE Aepb Commonly known as: ADVAIR Inhale 1 puff into the lungs 2 (two) times daily.   furosemide 80 MG tablet Commonly known as: LASIX TAKE 1 & 1/2 TABLETS  BY MOUTH DAILY What changed: additional instructions   gabapentin 100 MG capsule Commonly known as: NEURONTIN 2 tabs in the AM and 4 in the PM. What changed:   how much to take  how to take this  when to take this  additional instructions   glucose blood test strip Commonly known as: OneTouch Verio USE AS DIRECTED TO CHECK BLOOD SUGAR TWICE A DAY. DX:E11.65   FREESTYLE LITE test strip Generic drug: glucose blood USE TO CHECK BLOOD SUGAR 4 TIMES DAILY.   HumaLOG 100 UNIT/ML injection Generic drug: insulin lispro USE MAXIMUM 76 UNITS PER   DAY WITH V-GO PUMP What changed: See the new instructions.   levothyroxine 137 MCG tablet Commonly known as: SYNTHROID Take 1 tablet (137 mcg total) by mouth daily before breakfast.   metoprolol succinate 50 MG 24 hr tablet Commonly known as: TOPROL-XL TAKE 1 TABLET BY MOUTH DAILY. TAKE WITH OR IMMEDIATELY FOLLOWING A MEAL.   Myrbetriq 25 MG Tb24 tablet Generic drug: mirabegron ER TAKE 1 TABLET BY MOUTH EVERY DAY   nitroGLYCERIN 0.4 MG SL tablet Commonly known as: NITROSTAT Place 1 tablet (0.4 mg total) under the tongue every 5 (five) minutes as needed for chest pain.   omega-3 acid ethyl esters 1 g capsule Commonly known as: LOVAZA Take 2 capsules (2 g total) by mouth 2 (two) times daily.   OmniPod Dash System Kit 1 each by Does not apply route. Use Dash system for Continuous Blood Glucose Monitoring.   OVER THE COUNTER MEDICATION Apply 1 application topically daily as needed (pain). Theraworx Pain Cream   Ozempic (1 MG/DOSE) 2 MG/1.5ML Sopn Generic drug: Semaglutide (1 MG/DOSE) Inject  1 mg into the skin once a week. What changed: when to take this   polyethylene glycol 17 g packet Commonly known as: MIRALAX / GLYCOLAX Take 8.5 g by mouth daily.   potassium chloride SA 20 MEQ tablet Commonly known as: Klor-Con M20 Take 3 tablets (60 mEq total) by mouth daily.   rosuvastatin 20 MG tablet Commonly known as: CRESTOR Take 1 tablet (20 mg total) by mouth at bedtime.   SYSTANE BALANCE OP Place 1 drop into the left eye daily as needed (dry eyes). Uses Systane gel apply to the eye as needed   traMADol 50 MG tablet Commonly known as: ULTRAM Take 1 tablet (50 mg total) by mouth 2 (two) times daily.   vitamin B-12 1000 MCG tablet Commonly known as: CYANOCOBALAMIN Take 1,000 mcg by mouth daily.   Vitamin D 50 MCG (2000 UT) tablet Take 2,000 Units by mouth daily.       Allergies:  Allergies  Allergen Reactions  . Penicillins Anaphylaxis and Rash    Because of a history of documented adverse serious drug reaction;Medi Alert bracelet  is recommended PATIENT HAS HAD A PCN REACTION WITH IMMEDIATE RASH, FACIAL/TONGUE/THROAT SWELLING, SOB, OR LIGHTHEADEDNESS WITH HYPOTENSION:  #  #  YES  #  #  Has patient had a PCN reaction causing severe rash involving mucus membranes or skin necrosis: unknown Has patient had a PCN reaction that required hospitalization NO Has patient had a PCN reaction occurring within the last 10 years: NO  . Klonopin [Clonazepam] Other (See Comments)    agitation  . Peanut-Containing Drug Products Cough  . Watermelon [Citrullus Vulgaris] Other (See Comments) and Cough    Tickle in throat    Past Medical History:  Diagnosis Date  . AICD (automatic cardioverter/defibrillator) present    Dr Ladona Ridgel office visit yearly,  MDT  medtronic   . Arthritis    cane  . Asthma   . Benign neoplasm of colon   . CAD (coronary artery disease)   . Cardiomyopathy   . Cataract    removed  . Complication of anesthesia    pt states that he got a rash  .  Constipation   . 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  . Dysrhythmia    a-fib  . GERD (gastroesophageal reflux disease)   . Glaucoma    right eye  . History of kidney stones    multiple  . HLD (hyperlipidemia)   . HTN (hypertension)    pt denies 08/19/12  . Hyperplasia, prostate   . Hyperthyroidism    thyroid lobectomy  . Hypothyroidism   . MI (myocardial infarction) [facility])    Dr Stanford Breed 2000, x3vessels bypass  . Nephrolithiasis   . OSA (obstructive sleep apnea)    AHI-28,on CPAP, noncompliant with CPAP  . Parkinson disease ([place])    1999  . PONV (postoperative nausea and vomiting)   . Restless legs   . Shortness of breath    Hx: of at all times  . Sleep apnea    does not use CPAP  . Sleep apnea, organic   . UTI (lower urinary tract infection) 09/15/12   Klebsiella  . Ventral hernia   . Walker as ambulation aid    also uses wheelchair at home/when going out    Past Surgical History:  Procedure Laterality Date  . 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 BIOPSY  10/14/2018   Procedure: BIOPSY;  Surgeon: Jerene Bears, MD;  Location: [facility];  Service: Gastroenterology;;  . CARDIAC CATHETERIZATION    . CATARACT EXTRACTION W/ INTRAOCULAR LENS IMPLANT     Hx: of right eye  . CATARACT EXTRACTION W/ INTRAOCULAR LENS IMPLANT Left 2018  . COLONOSCOPY N/A 10/13/2014   Procedure: COLONOSCOPY;  Surgeon: Jerene Bears, MD;  Location: [facility];  Service: Gastroenterology;  Laterality: N/A;  . COLONOSCOPY W/ BIOPSIES AND POLYPECTOMY     Hx: of  . COLONOSCOPY WITH PROPOFOL N/A 10/14/2018   Procedure: COLONOSCOPY WITH PROPOFOL;  Surgeon: Jerene Bears, MD;  Location: [facility];  Service: Gastroenterology;  Laterality: N/A;  . CORONARY ARTERY BYPASS GRAFT  2000   [name], MD  . [place]  . DEEP BRAIN STIMULATOR PLACEMENT  2004   Right and left VIN  stimulator placement (parkinsons)  . EYE SURGERY    . FINGER AMPUTATION     left pointer  . IMPLANTABLE CARDIOVERTER DEFIBRILLATOR IMPLANT N/A 11/13/2013   Procedure: IMPLANTABLE CARDIOVERTER DEFIBRILLATOR IMPLANT;  Surgeon: Evans Lance, MD;  Location: [facility] C[facility] LAB;  Service: Cardiovascular;  Laterality: N/A;  . INSERT / REPLACE / REMOVE PACEMAKER     medtronic  . LEFT AND RIGHT HEART CATHETERIZATION WITH CORONARY ANGIOGRAM N/A 09/24/2013   Procedure: LEFT AND RIGHT HEART CATHETERIZATION WITH CORONARY ANGIOGRAM;  Surgeon: Burnell Blanks, MD;  Location: [facility] CATH LAB;  Service: Cardiovascular;  Laterality: N/A;  . LITHOTRIPSY     3 different times  . MEDIAN STERNOTOMY  2000  . POLYPECTOMY  10/14/2018   Procedure: POLYPECTOMY;  Surgeon: Jerene Bears, MD;  Location: [facility];  Service: Gastroenterology;;  . PULSE GENERATOR IMPLANT Right 11/13/2017   Procedure: Right chest  implantable pulse generator change;  Surgeon: Erline Levine, MD;  Location: [place];  Service: Neurosurgery;  Laterality: Right;  Right chest implantable pulse generator change  . SUBTHALAMIC STIMULATOR BATTERY REPLACEMENT N/A 09/05/2012   Procedure: Deep brain stimulator battery change;  Surgeon: Erline Levine, MD;  Location: [place] [facility]URO OR[facility];  Service: Neurosurgery;  Laterality: N/A;  Deep brain stimulator battery change  . SUBTHALAMIC STIMULATOR BATTERY REPLACEMENT N/A 06/10/2015   Procedure: Deep Brain stimulator battery change;  Surgeon: Erline Levine, MD;  Location: [place] NEURO ORS;  Service: Neurosurgery;  Laterality: N/A;  . SUBTHALAMIC STIMULATOR BATTERY REPLACEMENT Right 09/25/2019   Procedure: Deep brain stimulator battery change;  Surgeon: Erline Levine, MD;  Location: [place];  Service: Neurosurgery;  Laterality: Right;  . TONSILLECTOMY      Family History  Problem Relation Age of Onset  . Aneurysm Mother   . Alcoholism Father   . HIV/AIDS Brother 47       AIDS  . Healthy Sister   . Healthy Child   . Peripheral  vascular disease Other   . Arthritis Other   . Healthy Sister   . Healthy Child   . Healthy Child   . Diabetes Neg Hx   . Heart disease Neg Hx   . Colon cancer Neg Hx   . Prostate cancer Neg Hx     Social History:  reports that he has never smoked. He has never used smokeless tobacco. He reports previous alcohol use. He reports that he does not use drugs.    Review of Systems     HYPERLIPIDEMIA:  He is on Crestor 20 mg with good control of LDL, has low HDL Triglycerides are persistently over 200, treated with Lovaza 2 g twice a day       Lab Results  Component Value Date   CHOL 130 04/24/2019   CHOL 116 09/18/2018   CHOL 130 03/12/2018   Lab Results  Component Value Date   HDL 23.20 (L) 04/24/2019   HDL 25.80 (L) 09/18/2018   HDL 29.00 (L) 03/12/2018   Lab Results  Component Value Date   LDLCALC 118 (H) 09/16/2015   LDLCALC 126 (H) 04/08/2013   LDLCALC 51 02/28/2012   Lab Results  Component Value Date   TRIG 297.0 (H) 04/24/2019   TRIG 270.0 (H) 09/18/2018   TRIG 257.0 (H) 03/12/2018   Lab Results  Component Value Date   CHOLHDL 6 04/24/2019   CHOLHDL 4 09/18/2018   CHOLHDL 4 03/12/2018   Lab Results  Component Value Date   LDLDIRECT 51.0 04/24/2019   LDLDIRECT 54.0 09/18/2018   LDLDIRECT 67.0 03/12/2018                  Thyroid:   He has had hypothyroidism following radioactive iodine treatment for hyperthyroidism in 2013   TSH is improved with now taking 137 mcg levothyroxine   Lab Results  Component Value Date   TSH 0.79 07/01/2019   TSH 5.40 (H) 04/24/2019   TSH 2.64 08/16/2018   FREET4 0.77 04/24/2019   FREET4 0.88 08/16/2018   FREET4 0.75 03/12/2018   Has chronic systolic CHF He has some variability in his leg edema but recently appears to have less Currently taking 120 mg Lasix daily without metolazone  His wife can check his blood pressure at home also  Blood pressure history:  BP Readings from Last 3 Encounters:  12/19/19  108/70  10/30/19 98/62  10/28/19 118/74     CKD: Has variable creatinine  levels as follows: Regularly followed by nephrologist No history of microalbuminuria but needs follow-up  Lab Results  Component Value Date   CREATININE 1.36 (H) 09/25/2019   CREATININE 1.29 (H) 08/19/2019   CREATININE 1.49 07/21/2019    Neuropathy: Taking gabapentin 100 mg from his PCP. Has history of numbness in his toes  Diabetic foot exam in 08/2018  Last eye exam 11/20, negative  Physical Examination:  BP 108/70 (BP Location: Left Arm, Patient Position: Sitting, Cuff Size: Large)   Pulse 76   Temp 97.9 F (36.6 C) (Oral)   Ht $R'5\' 9"'ZT$  (1.753 m)   Wt 251 lb (113.9 kg)   SpO2 95%   BMI 37.07 kg/m      ASSESSMENT/PLAN  Diabetes type 2, insulin-dependent with obesity     See history of present illness for detailed discussion of his current management, blood sugar patterns and problems identified  His A1c is again excellent under 6%  He is using the Omni pod insulin pump with the older PDM and also Ozempic 1 mg weekly Has been able to maintain a good diet with the help of Ozempic and also his wife helping him plan his meals; he appears to be more motivated to watch his snacks also However has started to gain weight possibly from more efficient blood sugar control   He is a good candidate for a medication like Farxiga with his diabetes, obesity, tendency to weight gain, history of CHF and CKD  We will need to coordinate starting this with his cardiologist and PCP  If agreeable he will start 5 mg Farxiga in the morning daily and given him co-pay card and patient information handout regarding benefits long-term  Most likely will reduce his insulin when starting [place] and cut back the basal rate by 0.3 throughout the day  Also may need less boluses  His Lasix will be reduced to 80 mg instead of 120 but will need to follow his level of edema  He will let us know if he has any changes in  blood pressure also  Follow-up in 1 month  HYPOTHYROIDISM: TSH to be checked today  RENAL dysfunction: Needs to be followed    Patient Instructions  Keep up with BP and fluids with Wilder Glade  Reduce insulin and lasix to 1         Elayne Snare 12/19/2019, 10:57 AM   Note: This office note was prepared with Dragon voice recognition system technology. Any transcriptional errors that result from this process are unintentional.

## 2019-12-21 NOTE — Progress Notes (Signed)
Thyroid level slightly low, add extra half tablet of levothyroxine weekly.

## 2019-12-22 ENCOUNTER — Telehealth: Payer: Self-pay | Admitting: Endocrinology

## 2019-12-22 MED ORDER — DAPAGLIFLOZIN PROPANEDIOL 5 MG PO TABS: 5.0000 mg | ORAL_TABLET | Freq: Every day | ORAL | 3 refills | Status: DC

## 2019-12-22 NOTE — Telephone Encounter (Signed)
Please do PA

## 2019-12-22 NOTE — Telephone Encounter (Signed)
Please let him or his wife know that his PCP and cardiologist are okay with starting Farxiga and prescription is sent.  They have instructions on how to adjust insulin and Lasix as well as checking for swelling and blood pressure changes

## 2019-12-23 NOTE — Telephone Encounter (Signed)
P.A has been approved through cover my meds.

## 2020-01-13 ENCOUNTER — Telehealth: Payer: Self-pay | Admitting: Endocrinology

## 2020-01-13 NOTE — Telephone Encounter (Signed)
Patient did not start Marcelline Deist yet - appointment was to measure efficacy of Farxiga.  Patient is just now starting [place] and appointment will be changed to 4-6 weeks out.   Att this time however patient and wife in assistance changing basal rates on [facility]   Call back number [phone]

## 2020-01-13 NOTE — Telephone Encounter (Signed)
Agree with new basal rates

## 2020-01-13 NOTE — Telephone Encounter (Signed)
Please see below.

## 2020-01-13 NOTE — Telephone Encounter (Signed)
Not clear what they are asking about changing basal rates.

## 2020-01-13 NOTE — Telephone Encounter (Signed)
Wife says they are starting the [place] today and wanted help to reduce the basal rate by 0.3u/hr.  I talked her through doing this and had her write these directions.  She says she understands how to do this, but did not know how to calculate the basal rate amount.  New basal rate now:  MN: 1.6, 7AM: 2.1, 11AM: 2.7

## 2020-01-14 ENCOUNTER — Other Ambulatory Visit: Payer: Self-pay | Admitting: Endocrinology

## 2020-01-14 ENCOUNTER — Other Ambulatory Visit: Payer: Self-pay | Admitting: Neurology

## 2020-01-14 ENCOUNTER — Other Ambulatory Visit: Payer: Self-pay | Admitting: Family Medicine

## 2020-01-14 DIAGNOSIS — E890 Postprocedural hypothyroidism: Secondary | ICD-10-CM

## 2020-01-14 DIAGNOSIS — I509 Heart failure, unspecified: Secondary | ICD-10-CM

## 2020-01-14 NOTE — Telephone Encounter (Signed)
Please Advise

## 2020-01-14 NOTE — Telephone Encounter (Signed)
Sent. Thanks.   

## 2020-01-15 NOTE — Progress Notes (Signed)
Assessment/Plan:   1.  Parkinsons Disease             -Status post bilateral DBS STN surgery             -Battery changes occurring September 05, 2012; June 10, 2015; November 13, 2017; 09/25/19             -would like patient to not ambulate at all unless directly monitored             -Take carbidopa/levodopa 25/100 CR, 2 tablets at 7 AM/2 tablets at 11 AM/2 tablets at 3 PM/1 tablet at 7 PM             -Take carbidopa/levodopa 50/200 CR at bedtime 2. RBD -He has tried clonazepam several times and ultimately ends up stopping it because of side effect. Could try Rozerem in the future if insurance would pay. Decided to hold off for now as the patient states that while dreaming is vivid, it is not particularly bothersome and he is not falling out of the bed.Discussed bedroom safety. 3. Pseudobulbar affect (crying) -Wants no medication for that but wife does admit that this is becoming worse 4. OSAS, noncompliant with CPAP. -Patient aware of risks/morbidity and mortality associated with untreated sleep apnea 5. CHF -ICD placed on 11/15/2013  -DBS will need placed into special mode if pt ever requires cardioversion 6. Facial paresthesias -Has been going on forfew years and became bilateral, not c/w TN. He has had several negative noncontrast CTs of the brain. Kidney function is not good enough for contrasted CT. His DBS device is not MRI compatible. -I am unsure what this is, and offered to try carbamazepine, but he really did not want that. -I have turned off his DBS and he noticed no change in the facial paresthesias. 7. Sialorrhea -Patient not interested in Botox.   Subjective:   Henry [name] was seen today in follow up for Parkinsons disease.  My previous records were reviewed prior to todays visit as well as outside records available to me.  Patient with wife who supplements  history.  Tremor is much better - he can no longer make it shake but it will get bad at times, L >R.    Pt had 2 falls - fell one time one knee and the other he fell on the carpet on his face (tht was 12/24).  He was trying to back up.  Pt denies lightheadedness, near syncope.  No hallucinations.  Mood has been good.  Patient saw cardiology October 21.  Those records are reviewed.  His Eliquis was discontinued because of falls.  Current prescribed movement disorder medications: Carbidopa/levodopa 25/100 CR, 2 tablets at 7 AM/11 AM/3 PM/1 tablet at 7 PM Carbidopa/levodopa 50/200 CR at bedtime    ALLERGIES:   Allergies  Allergen Reactions   Penicillins Anaphylaxis and Rash    Because of a history of documented adverse serious drug reaction;Medi Alert bracelet  is recommended PATIENT HAS HAD A PCN REACTION WITH IMMEDIATE RASH, FACIAL/TONGUE/THROAT SWELLING, SOB, OR LIGHTHEADEDNESS WITH HYPOTENSION:  #  #  YES  #  #  Has patient had a PCN reaction causing severe rash involving mucus membranes or skin necrosis: unknown Has patient had a PCN reaction that required hospitalization NO Has patient had a PCN reaction occurring within the last 10 years: NO   Klonopin [Clonazepam] Other (See Comments)    agitation   Peanut-Containing Drug Products Cough   Watermelon [Citrullus Vulgaris] Other (See  Comments) and Cough    Tickle in throat    CURRENT MEDICATIONS:  Outpatient Encounter Medications as of 01/19/2020  Medication Sig   acetaminophen (TYLENOL) 500 MG tablet Take 1,000 mg by mouth at bedtime as needed for mild pain or headache.    albuterol (VENTOLIN HFA) 108 (90 Base) MCG/ACT inhaler Inhale 2 puffs into the lungs every 6 (six) hours as needed for wheezing or shortness of breath.   allopurinol (ZYLOPRIM) 100 MG tablet TAKE 1 & 1/2 TABLET BY MOUTH DAILY (Patient taking differently: Take 150 mg by mouth daily.)   aspirin EC 81 MG tablet Take 1 tablet (81 mg total) by mouth daily. Swallow  whole.   carbidopa-levodopa (SINEMET CR) 50-200 MG tablet TAKE 1 TABLET BY MOUTH EVERYDAY AT BEDTIME   Carbidopa-Levodopa ER (SINEMET CR) 25-100 MG tablet controlled release TAKE 2 TABLETS AT 7AM,THEN 2TABLETS AT 11AM,THEN 2 TABS AT 3PM AND 1TAB AT 7PM,&1TAB AT BEDTIME   Cholecalciferol (VITAMIN D) 50 MCG (2000 UT) tablet Take 2,000 Units by mouth daily.    colchicine 0.6 MG tablet Take 1 tablet (0.6 mg total) by mouth 2 (two) times daily as needed (gout). (Patient taking differently: Take 0.6 mg by mouth as needed (gout).)   dapagliflozin propanediol (FARXIGA) 5 MG TABS tablet Take 1 tablet (5 mg total) by mouth daily.   dorzolamide-timolol (COSOPT) 22.3-6.8 MG/ML ophthalmic solution Place 1 drop into the right eye 2 (two) times daily.   Fluticasone-Salmeterol (ADVAIR) 250-50 MCG/DOSE AEPB Inhale 1 puff into the lungs 2 (two) times daily.   FREESTYLE LITE test strip USE TO CHECK BLOOD SUGAR 4 TIMES DAILY.   furosemide (LASIX) 80 MG tablet TAKE 1 & 1/2 TABLETS BY MOUTH DAILY (Patient taking differently: Take 120 mg by mouth daily. Pt takes 1 tablet in the morning and 1/2 tablet at night.)   gabapentin (NEURONTIN) 100 MG capsule TAKE 2 CAPSULES BY MOUTH EVERY MORNING AND TAKE 4 CAPSULES BY MOUTH EVERY EVENING   glucose blood (ONETOUCH VERIO) test strip USE AS DIRECTED TO CHECK BLOOD SUGAR TWICE A DAY. DX:E11.65   HUMALOG 100 UNIT/ML injection USE MAXIMUM 76 UNITS PER   DAY WITH V-GO PUMP (Patient taking differently: Uses in omnipod insulin pump)   Insulin Disposable Pump (OMNIPOD DASH SYSTEM) KIT 1 each by Does not apply route. Use Dash system for Continuous Blood Glucose Monitoring.   KLOR-CON M20 20 MEQ tablet TAKE 3 TABLETS (60 MEQ TOTAL) BY MOUTH DAILY.   levothyroxine (SYNTHROID) 137 MCG tablet TAKE 1 TABLET (137 MCG TOTAL) BY MOUTH DAILY BEFORE BREAKFAST.   metoprolol succinate (TOPROL-XL) 50 MG 24 hr tablet TAKE 1 TABLET BY MOUTH DAILY. TAKE WITH OR IMMEDIATELY FOLLOWING A MEAL.    MYRBETRIQ 25 MG TB24 tablet TAKE 1 TABLET BY MOUTH EVERY DAY   nitroGLYCERIN (NITROSTAT) 0.4 MG SL tablet Place 1 tablet (0.4 mg total) under the tongue every 5 (five) minutes as needed for chest pain.   omega-3 acid ethyl esters (LOVAZA) 1 g capsule Take 2 capsules (2 g total) by mouth 2 (two) times daily.   OVER THE COUNTER MEDICATION Apply 1 application topically daily as needed (pain). Theraworx Pain Cream   polyethylene glycol (MIRALAX / GLYCOLAX) packet Take 8.5 g by mouth daily.   Propylene Glycol (SYSTANE BALANCE OP) Place 1 drop into the left eye daily as needed (dry eyes). Uses Systane gel apply to the eye as needed   rosuvastatin (CRESTOR) 20 MG tablet Take 1 tablet (20 mg total) by mouth at  bedtime.   Semaglutide, 1 MG/DOSE, (OZEMPIC, 1 MG/DOSE,) 2 MG/1.5ML SOPN Inject 1 mg into the skin once a week. (Patient taking differently: Inject 1 mg into the skin every Saturday.)   traMADol (ULTRAM) 50 MG tablet TAKE 1 TABLET BY MOUTH TWICE A DAY   vitamin B-12 (CYANOCOBALAMIN) 1000 MCG tablet Take 1,000 mcg by mouth daily.   No facility-administered encounter medications on file as of 01/19/2020.    Objective:   PHYSICAL EXAMINATION:    VITALS:   Vitals:   01/19/20 1359  BP: 102/60  Pulse: 76  SpO2: 94%  Weight: 250 lb (113.4 kg)  Height: _0  (1.753 m)    GEN:  The patient appears stated age and is in NAD. HEENT:  Normocephalic, atraumatic.  The mucous membranes are moist. The superficial temporal arteries are without ropiness or tenderness.   Neurological examination:  Orientation: The patient is alert and oriented x3. Cranial nerves: There is good facial symmetry with facial hypomimia. The speech is fluent and dysarthric (baseline) he is hypophonic.Marland Kitchen Soft palate rises symmetrically and there is no tongue deviation. Hearing is intact to conversational tone. Sensation: Sensation is intact to light touch throughout Motor: Strength is at least antigravity  x4.  Movement examination: Tone: There is normal tone in the upper and lower extremities following program change (prior to programming there was mild increased tone in the left upper extremity) Abnormal movements: occasional LLE rest tremor prior to programming Coordination:  There is decremation with RAM's, mostly with foot taps on the left.  Better prior to programming Gait and Station: The patient is helped out of the chair.  Has start hesitation.  Holds onto examiner until he is given his walker.  He actually walks pretty well with good stride length with it.  He even turns well with it.  He festinates some.   Once that is taken away, he shuffles again and is short stepped.  I have reviewed and interpreted the following labs independently    Chemistry      Component Value Date/Time   NA 138 12/19/2019 1027   NA 142 12/22/2016 1233   K 4.1 12/19/2019 1027   CL 98 12/19/2019 1027   CO2 33 (H) 12/19/2019 1027   BUN 22 12/19/2019 1027   BUN 18 07/01/2018 0000   CREATININE 1.53 (H) 12/19/2019 1027   CREATININE 2.13 06/25/2017 0000   CREATININE 1.53 (H) 05/22/2016 1106      Component Value Date/Time   CALCIUM 9.1 12/19/2019 1027   ALKPHOS 104 08/16/2018 0916   AST 8 08/16/2018 0916   ALT 8 08/16/2018 0916   BILITOT 0.5 08/16/2018 0916       Lab Results  Component Value Date   WBC 6.2 09/25/2019   HGB 13.9 09/25/2019   HCT 43.7 09/25/2019   MCV 86.9 09/25/2019   PLT 195 09/25/2019    Lab Results  Component Value Date   TSH 4.66 (H) 12/19/2019     Total time spent on today's visit was 30 minutes, including both face-to-face time and nonface-to-face time.  Time included that spent on review of records (prior notes available to me/labs/imaging if pertinent), discussing treatment and goals, answering patient's questions and coordinating care.  This was not inclusive of DBS time.    Cc:  Tonia Ghent, MD

## 2020-01-15 NOTE — Telephone Encounter (Signed)
Rx(s) sent to pharmacy electronically.  

## 2020-01-16 NOTE — Procedures (Addendum)
DBS Programming was performed.    Manufacturer of DBS device: [employer]tronic  Total time spent programming was 30 minutes.  Device was confirmed to be on.  Soft start was confirmed to be on.  Impedences were checked and were within normal limits.  Final settings were as follows with Group B active:   Active Contact Amplitude  PW (ms) Frequency (hz) Side Effects Battery  Left Brain        09/08/19 1-2+ 4.4V 90 170  2.59  10/08/19             Group A 1-2+ 4.61mA 90 170         Group B 0-1-2-C+ 1.1 80 125     0- (1.1)1-(1.0)2-(0.6)       01/19/20             Group B 0-1-2-C+ 1.1 80 125                                                    Right Brain        09/08/19 4-7+ 4.5V 90 170    10/08/19             Group A 4-7+ 2.90mA 90 170         Group B 4-7-C+ 2.5 60 125     4-(2.5)7-(0.6)C+       01/19/20 4-7-C+ 2.5 60 125

## 2020-01-19 ENCOUNTER — Ambulatory Visit (INDEPENDENT_AMBULATORY_CARE_PROVIDER_SITE_OTHER): Payer: Medicare Other | Admitting: Neurology

## 2020-01-19 ENCOUNTER — Encounter: Payer: Self-pay | Admitting: Neurology

## 2020-01-19 ENCOUNTER — Other Ambulatory Visit: Payer: Self-pay

## 2020-01-19 VITALS — BP 102/60 | HR 76 | Ht 69.0 in | Wt 250.0 lb

## 2020-01-19 DIAGNOSIS — G20 Parkinson's disease: Secondary | ICD-10-CM | POA: Diagnosis not present

## 2020-01-19 NOTE — Procedures (Signed)
DBS Programming was performed.    Manufacturer of DBS device: [employer]tronic  Total time spent programming was 30 minutes.  Device was confirmed to be on.  Soft start was confirmed to be on.  Impedences were checked and were within normal limits.  Battery was checked and was 92%.    Final settings were as follows with Group B active:   Active Contact Amplitude  PW (ms) Frequency (hz) Side Effects Battery  Left Brain        09/08/19 1-2+ 4.4V 90 170  2.59  10/08/19             Group A 1-2+ 4.73mA 90 170         Group B 0-1-2-C+ 1.1 80 125     0- (1.1)1-(1.0)2-(0.6)       01/19/20             Group B 0-1-2-C+ 1.1 80 125  4 yrs 9 months                                          Right Brain        09/08/19 4-7+ 4.5V 90 170    10/08/19             Group A 4-7+ 2.60mA 90 170         Group B 4-7-C+ 2.5 60 125     4-(2.5)7-(0.6)C+       01/19/20             Group B 4-7-C+ 2.8 60 165

## 2020-01-20 ENCOUNTER — Ambulatory Visit: Payer: Medicare Other | Admitting: Endocrinology

## 2020-01-21 ENCOUNTER — Ambulatory Visit (INDEPENDENT_AMBULATORY_CARE_PROVIDER_SITE_OTHER): Payer: Medicare Other

## 2020-01-21 DIAGNOSIS — I255 Ischemic cardiomyopathy: Secondary | ICD-10-CM | POA: Diagnosis not present

## 2020-01-21 LAB — CUP PACEART REMOTE DEVICE CHECK
Battery Remaining Longevity: 39 mo
Battery Voltage: 2.96 V
Brady Statistic AP VP Percent: 0.01 %
Brady Statistic AP VS Percent: 1.62 %
Brady Statistic AS VP Percent: 0.06 %
Brady Statistic AS VS Percent: 98.31 %
Brady Statistic RA Percent Paced: 1.61 %
Brady Statistic RV Percent Paced: 0.07 %
Date Time Interrogation Session: 20220112052824
HighPow Impedance: 64 Ohm
Implantable Lead Implant Date: 20151105
Implantable Lead Implant Date: 20151105
Implantable Lead Location: 753859
Implantable Lead Location: 753860
Implantable Lead Model: 5076
Implantable Pulse Generator Implant Date: 20151105
Lead Channel Impedance Value: 437 Ohm
Lead Channel Impedance Value: 456 Ohm
Lead Channel Impedance Value: 494 Ohm
Lead Channel Pacing Threshold Amplitude: 0.375 V
Lead Channel Pacing Threshold Amplitude: 1.25 V
Lead Channel Pacing Threshold Pulse Width: 0.4 ms
Lead Channel Pacing Threshold Pulse Width: 0.4 ms
Lead Channel Sensing Intrinsic Amplitude: 14 mV
Lead Channel Sensing Intrinsic Amplitude: 14 mV
Lead Channel Sensing Intrinsic Amplitude: 2 mV
Lead Channel Sensing Intrinsic Amplitude: 2 mV
Lead Channel Setting Pacing Amplitude: 2.5 V
Lead Channel Setting Pacing Amplitude: 2.5 V
Lead Channel Setting Pacing Pulse Width: 0.4 ms
Lead Channel Setting Sensing Sensitivity: 0.3 mV

## 2020-01-31 ENCOUNTER — Other Ambulatory Visit: Payer: Self-pay | Admitting: Endocrinology

## 2020-02-03 NOTE — Progress Notes (Signed)
Remote ICD transmission.   

## 2020-02-24 ENCOUNTER — Other Ambulatory Visit: Payer: Self-pay

## 2020-02-24 ENCOUNTER — Encounter: Payer: Self-pay | Admitting: Endocrinology

## 2020-02-24 ENCOUNTER — Ambulatory Visit (INDEPENDENT_AMBULATORY_CARE_PROVIDER_SITE_OTHER): Payer: Medicare Other | Admitting: Endocrinology

## 2020-02-24 VITALS — BP 102/64 | HR 72 | Ht 69.0 in | Wt 249.0 lb

## 2020-02-24 DIAGNOSIS — E890 Postprocedural hypothyroidism: Secondary | ICD-10-CM

## 2020-02-24 DIAGNOSIS — I255 Ischemic cardiomyopathy: Secondary | ICD-10-CM

## 2020-02-24 DIAGNOSIS — E1165 Type 2 diabetes mellitus with hyperglycemia: Secondary | ICD-10-CM

## 2020-02-24 MED ORDER — DEXCOM G6 SENSOR MISC: 3 refills | Status: DC

## 2020-02-24 MED ORDER — DEXCOM G6 TRANSMITTER MISC: 1.0000 | Freq: Once | 1 refills | Status: AC

## 2020-02-24 NOTE — Patient Instructions (Signed)
More water when on Farxiga

## 2020-02-24 NOTE — Progress Notes (Signed)
Patient ID: Henry [name], male   DOB: Aug 14, 1950, 70 y.o.   MRN: [id number]           Reason for Appointment: Follow-up for Type 2 Diabetes   History of Present Illness:          Diagnosis: Type 2 diabetes mellitus, date of diagnosis: 2000      Past history:   Patient thinks he has been taking Amaryl for several years and probably metformin since onset also At some point he was changed from metformin to [facility] and was taking this since at least 2012 A1c had been higher in 2015 Since his A1c had been progressively higher with his regimen of Janumet and Amaryl he was started on Victoza in 01/2014 He was started on Lantus insulin in 5/16 because of persistent hyperglycemia especially fasting; was having readings as high as 293 Because of tendency to high postprandial readings and for control with Lantus he was switched to the V-go pump in  09/2014  Recent history:    INSULIN regimen: Omnipod insulin pump with Humalog insulin   Basal rate programs: Midnight = 1.6, 7 AM = 2.1, 11 a.m. = 2.7, total basal 52 units  Boluses 3-5 units for breakfast, 4-6 lunch and 3 -9 units for dinner  Non-insulin hypoglycemic drugs the patient is taking are: Ozempic 1 mg weekly  His A1c has been consistently over 8% in the past; last 5.9, previously 5.8   Current management, blood sugar patterns and problems identified:  He is here for short-term follow-up after initiating Wilder Glade after his last visit  Although initially he needed prior authorization he took the [place] for about 3 weeks in January  He thinks that his blood sugars were higher when he started this but also his basal rate for reduced as a precaution  He stopped taking it on 1/25 approximately because he thought it was making his throat swell up and cause some difficulty swallowing as well as dryness of the mouth and throat.  Did not call to report this  Surprisingly however his blood sugars are still excellent even with  reducing his basal rate by about 7 units/day  He is still trying to make efforts to follow his diet plan and avoid snacks at night  His weight has come down 2 pounds, previously gaining weight  His wife states that he is afraid to do a bolus when his blood sugars are in the 80s but he has not had any hypoglycemia including overnight  Has been consistent with Ozempic  Has occasional postprandial readings only which appear to be within target  He was also asked to review and get the order for the freestyle libre system but he did not call the DME supplier  Hypoglycemia:   none  He has seen the dietitian in 04/2017  Not able to exercise because of back pain and difficulty walking       Side effects from medications have been: None Compliance with the medical regimen: Fair   Glucose monitoring:  Recently usually 3 times  a day         Glucometer:  Freestyle with the OmniPod PDM       Blood Glucose readings by download of pump    PRE-MEAL Fasting Lunch Dinner Bedtime Overall  Glucose range:  85-132  96  86-125  146  85-150  Mean/median:  110   115   112   POST-MEAL PC Breakfast PC Lunch PC Dinner  Glucose range:  150  Mean/median:      PREVIOUSLY:  PRE-MEAL Fasting Lunch Dinner Bedtime Overall  Glucose range:  94-139   86-136  103-147   Mean/median: 119 119 115  119   POST-MEAL PC Breakfast PC Lunch PC Dinner  Glucose range:     Mean/median:   126     Meals: 9 am , 2-3 pm and 6-7 pm            Dietician visit, most recent: 4/19               Weight history:   Wt Readings from Last 3 Encounters:  02/24/20 249 lb (112.9 kg)  01/19/20 250 lb (113.4 kg)  12/19/19 251 lb (113.9 kg)    Glycemic control:   Lab Results  Component Value Date   HGBA1C 5.9 (A) 12/19/2019   HGBA1C 5.8 (A) 09/08/2019   HGBA1C 8.6 (H) 04/24/2019   Lab Results  Component Value Date   MICROALBUR <0.7 12/19/2019   LDLCALC 118 (H) 09/16/2015   CREATININE 1.53 (H) 12/19/2019    Lab  Results  Component Value Date   FRUCTOSAMINE 359 (H) 07/01/2019   FRUCTOSAMINE 277 11/09/2017   FRUCTOSAMINE 350 (H) 03/29/2017     Other active problems: See review of systems      Allergies as of 02/24/2020      Reactions   Penicillins Anaphylaxis, Rash   Because of a history of documented adverse serious drug reaction;Medi Alert bracelet  is recommended PATIENT HAS HAD A PCN REACTION WITH IMMEDIATE RASH, FACIAL/TONGUE/THROAT SWELLING, SOB, OR LIGHTHEADEDNESS WITH HYPOTENSION:  #  #  YES  #  #  Has patient had a PCN reaction causing severe rash involving mucus membranes or skin necrosis: unknown Has patient had a PCN reaction that required hospitalization NO Has patient had a PCN reaction occurring within the last 10 years: NO   Klonopin [clonazepam] Other (See Comments)   agitation   Peanut-containing Drug Products Cough   Watermelon [citrullus Vulgaris] Other (See Comments), Cough   Tickle in throat      Medication List       Accurate as of February 24, 2020  1:15 PM. If you have any questions, ask your nurse or doctor.        acetaminophen 500 MG tablet Commonly known as: TYLENOL Take 1,000 mg by mouth at bedtime as needed for mild pain or headache.   albuterol 108 (90 Base) MCG/ACT inhaler Commonly known as: VENTOLIN HFA Inhale 2 puffs into the lungs every 6 (six) hours as needed for wheezing or shortness of breath.   allopurinol 100 MG tablet Commonly known as: ZYLOPRIM TAKE 1 & 1/2 TABLET BY MOUTH DAILY What changed: See the new instructions.   apixaban 5 MG Tabs tablet Commonly known as: ELIQUIS Take by mouth.   aspirin EC 81 MG tablet Take 1 tablet (81 mg total) by mouth daily. Swallow whole.   carbidopa-levodopa 50-200 MG tablet Commonly known as: SINEMET CR TAKE 1 TABLET BY MOUTH EVERYDAY AT BEDTIME What changed: Another medication with the same name was changed. Make sure you understand how and when to take each.   Carbidopa-Levodopa ER 25-100  MG tablet controlled release Commonly known as: SINEMET CR TAKE 2 TABLETS AT 7AM,THEN 2TABLETS AT 11AM,THEN 2 TABS AT 3PM AND 1TAB AT 7PM,&1TAB AT BEDTIME What changed: See the new instructions.   colchicine 0.6 MG tablet Take 1 tablet (0.6 mg total) by mouth 2 (two) times daily as needed (gout). What  changed: when to take this   dapagliflozin propanediol 5 MG Tabs tablet Commonly known as: Farxiga Take 1 tablet (5 mg total) by mouth daily.   dorzolamide-timolol 22.3-6.8 MG/ML ophthalmic solution Commonly known as: COSOPT Place 1 drop into the right eye 2 (two) times daily.   Fluticasone-Salmeterol 250-50 MCG/DOSE Aepb Commonly known as: ADVAIR Inhale 1 puff into the lungs 2 (two) times daily.   FREESTYLE LITE test strip Generic drug: glucose blood USE TO CHECK BLOOD SUGAR 4 TIMES DAILY.   furosemide 80 MG tablet Commonly known as: LASIX TAKE 1 & 1/2 TABLETS BY MOUTH DAILY What changed: additional instructions   gabapentin 100 MG capsule Commonly known as: NEURONTIN TAKE 2 CAPSULES BY MOUTH EVERY MORNING AND TAKE 4 CAPSULES BY MOUTH EVERY EVENING   HumaLOG 100 UNIT/ML injection Generic drug: insulin lispro USE MAXIMUM 76 UNITS PER   DAY WITH V-GO PUMP What changed: See the new instructions.   Klor-Con M20 20 MEQ tablet Generic drug: potassium chloride SA TAKE 3 TABLETS (60 MEQ TOTAL) BY MOUTH DAILY.   levothyroxine 137 MCG tablet Commonly known as: SYNTHROID TAKE 1 TABLET (137 MCG TOTAL) BY MOUTH DAILY BEFORE BREAKFAST. What changed: additional instructions   metoprolol succinate 50 MG 24 hr tablet Commonly known as: TOPROL-XL TAKE 1 TABLET BY MOUTH DAILY. TAKE WITH OR IMMEDIATELY FOLLOWING A MEAL.   Myrbetriq 25 MG Tb24 tablet Generic drug: mirabegron ER TAKE 1 TABLET BY MOUTH EVERY DAY   nitroGLYCERIN 0.4 MG SL tablet Commonly known as: NITROSTAT Place 1 tablet (0.4 mg total) under the tongue every 5 (five) minutes as needed for chest pain.   omega-3 acid  ethyl esters 1 g capsule Commonly known as: LOVAZA Take 2 capsules (2 g total) by mouth 2 (two) times daily.   OmniPod Dash System Kit 1 each by Does not apply route. Use Dash system for Continuous Blood Glucose Monitoring.   OVER THE COUNTER MEDICATION Apply 1 application topically daily as needed (pain). Theraworx Pain Cream   Ozempic (1 MG/DOSE) 2 MG/1.5ML Sopn Generic drug: Semaglutide (1 MG/DOSE) Inject 1 mg into the skin once a week. What changed: when to take this   Ozempic (1 MG/DOSE) 4 MG/3ML Sopn Generic drug: Semaglutide (1 MG/DOSE) INJECT 1MG SUBCUTANEOUSLY  ONCE A WEEK. What changed: Another medication with the same name was changed. Make sure you understand how and when to take each.   polyethylene glycol 17 g packet Commonly known as: MIRALAX / GLYCOLAX Take 8.5 g by mouth daily.   rosuvastatin 20 MG tablet Commonly known as: CRESTOR Take 1 tablet (20 mg total) by mouth at bedtime.   SYSTANE BALANCE OP Place 1 drop into the left eye daily as needed (dry eyes). Uses Systane gel apply to the eye as needed   traMADol 50 MG tablet Commonly known as: ULTRAM TAKE 1 TABLET BY MOUTH TWICE A DAY   vitamin B-12 1000 MCG tablet Commonly known as: CYANOCOBALAMIN Take 1,000 mcg by mouth daily.   Vitamin D 50 MCG (2000 UT) tablet Take 2,000 Units by mouth daily.       Allergies:  Allergies  Allergen Reactions  . Penicillins Anaphylaxis and Rash    Because of a history of documented adverse serious drug reaction;Medi Alert bracelet  is recommended PATIENT HAS HAD A PCN REACTION WITH IMMEDIATE RASH, FACIAL/TONGUE/THROAT SWELLING, SOB, OR LIGHTHEADEDNESS WITH HYPOTENSION:  #  #  YES  #  #  Has patient had a PCN reaction causing severe rash involving mucus  membranes or skin necrosis: unknown Has patient had a PCN reaction that required hospitalization NO Has patient had a PCN reaction occurring within the last 10 years: NO  . Klonopin [Clonazepam] Other (See  Comments)    agitation  . Peanut-Containing Drug Products Cough  . Watermelon [Citrullus Vulgaris] Other (See Comments) and Cough    Tickle in throat    Past Medical History:  Diagnosis Date  . AICD (automatic cardioverter/defibrillator) present    Dr Lovena Le office visit yearly, MDT  medtronic   . Arthritis    cane  . Asthma   . Benign neoplasm of colon   . CAD (coronary artery disease)   . Cardiomyopathy   . Cataract    removed  . Complication of anesthesia    pt states that he got a rash  . Constipation   . 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  . Dysrhythmia    a-fib  . GERD (gastroesophageal reflux disease)   . Glaucoma    right eye  . History of kidney stones    multiple  . HLD (hyperlipidemia)   . HTN (hypertension)    pt denies 08/19/12  . Hyperplasia, prostate   . Hyperthyroidism    thyroid lobectomy  . Hypothyroidism   . MI (myocardial infarction) [facility])    Dr Stanford Breed 2000, x3vessels bypass  . Nephrolithiasis   . OSA (obstructive sleep apnea)    AHI-28,on CPAP, noncompliant with CPAP  . Parkinson disease ([place])    1999  . PONV (postoperative nausea and vomiting)   . Restless legs   . Shortness of breath    Hx: of at all times  . Sleep apnea    does not use CPAP  . Sleep apnea, organic   . UTI (lower urinary tract infection) 09/15/12   Klebsiella  . Ventral hernia   . Walker as ambulation aid    also uses wheelchair at home/when going out    Past Surgical History:  Procedure Laterality Date  . 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 BIOPSY  10/14/2018   Procedure: BIOPSY;  Surgeon: Jerene Bears, MD;  Location: [facility];  Service: Gastroenterology;;  . CARDIAC CATHETERIZATION    . CATARACT EXTRACTION W/ INTRAOCULAR LENS IMPLANT     Hx: of right eye  . CATARACT EXTRACTION W/ INTRAOCULAR LENS IMPLANT Left 2018  . COLONOSCOPY N/A 10/13/2014    Procedure: COLONOSCOPY;  Surgeon: Jerene Bears, MD;  Location: [facility];  Service: Gastroenterology;  Laterality: N/A;  . COLONOSCOPY W/ BIOPSIES AND POLYPECTOMY     Hx: of  . COLONOSCOPY WITH PROPOFOL N/A 10/14/2018   Procedure: COLONOSCOPY WITH PROPOFOL;  Surgeon: Jerene Bears, MD;  Location: [facility];  Service: Gastroenterology;  Laterality: N/A;  . CORONARY ARTERY BYPASS GRAFT  2000   [name], MD  . [place]  . DEEP BRAIN STIMULATOR PLACEMENT  2004   Right and left VIN stimulator placement (parkinsons)  . EYE SURGERY    . FINGER AMPUTATION     left pointer  . IMPLANTABLE CARDIOVERTER DEFIBRILLATOR IMPLANT N/A 11/13/2013   Procedure: IMPLANTABLE CARDIOVERTER DEFIBRILLATOR IMPLANT;  Surgeon: Evans Lance, MD;  Location: [facility] CATH LAB;  Service: Cardiovascular;  Laterality: N/A;  . INSERT / REPLACE / REMOVE PACEMAKER     medtronic  . LEFT AND RIGHT HEART  CATHETERIZATION WITH CORONARY ANGIOGRAM N/A 09/24/2013   Procedure: LEFT AND RIGHT HEART CATHETERIZATION WITH CORONARY ANGIOGRAM;  Surgeon: Burnell Blanks, MD;  Location: [facility] CATH LAB;  Service: Cardiovascular;  Laterality: N/A;  . LITHOTRIPSY     3 different times  . MEDIAN STERNOTOMY  2000  . POLYPECTOMY  10/14/2018   Procedure: POLYPECTOMY;  Surgeon: Jerene Bears, MD;  Location: [facility];  Service: Gastroenterology;;  . PULSE GENERATOR IMPLANT Right 11/13/2017   Procedure: Right chest implantable pulse generator change;  Surgeon: Erline Levine, MD;  Location: [place];  Service: Neurosurgery;  Laterality: Right;  Right chest implantable pulse generator change  . SUBTHALAMIC STIMULATOR BATTERY REPLACEMENT N/A 09/05/2012   Procedure: Deep brain stimulator battery change;  Surgeon: Erline Levine, MD;  Location: [place] [facility];  Service: Neurosurgery;  Laterality: N/A;  Deep brain stimulator battery change  . SUBTHALAMIC STIMULATOR BATTERY REPLACEMENT N/A 06/10/2015   Procedure: Deep Brain stimulator  battery change;  Surgeon: Erline Levine, MD;  Location: [place] [facility];  Service: Neurosurgery;  Laterality: N/A;  . SUBTHALAMIC STIMULATOR BATTERY REPLACEMENT Right 09/25/2019   Procedure: Deep brain stimulator battery change;  Surgeon: Erline Levine, MD;  Location: [place];  Service: Neurosurgery;  Laterality: Right;  . TONSILLECTOMY      Family History  Problem Relation Age of Onset  . Aneurysm Mother   . Alcoholism Father   . HIV/AIDS Brother 32       AIDS  . Healthy Sister   . Healthy Child   . Peripheral vascular disease Other   . Arthritis Other   . Healthy Sister   . Healthy Child   . Healthy Child   . Diabetes Neg Hx   . Heart disease Neg Hx   . Colon cancer Neg Hx   . Prostate cancer Neg Hx     Social History:  reports that he has never smoked. He has never used smokeless tobacco. He reports previous alcohol use. He reports that he does not use drugs.    Review of Systems     HYPERLIPIDEMIA:  He is on Crestor 20 mg with good control of LDL, has low HDL Triglycerides are persistently over 200, treated with Lovaza 2 g twice a day       Lab Results  Component Value Date   CHOL 130 04/24/2019   CHOL 116 09/18/2018   CHOL 130 03/12/2018   Lab Results  Component Value Date   HDL 23.20 (L) 04/24/2019   HDL 25.80 (L) 09/18/2018   HDL 29.00 (L) 03/12/2018   Lab Results  Component Value Date   LDLCALC 118 (H) 09/16/2015   LDLCALC 126 (H) 04/08/2013   LDLCALC 51 02/28/2012   Lab Results  Component Value Date   TRIG 297.0 (H) 04/24/2019   TRIG 270.0 (H) 09/18/2018   TRIG 257.0 (H) 03/12/2018   Lab Results  Component Value Date   CHOLHDL 6 04/24/2019   CHOLHDL 4 09/18/2018   CHOLHDL 4 03/12/2018   Lab Results  Component Value Date   LDLDIRECT 51.0 04/24/2019   LDLDIRECT 54.0 09/18/2018   LDLDIRECT 67.0 03/12/2018                  Thyroid:   He has had hypothyroidism following radioactive iodine treatment for hyperthyroidism in 2013   TSH is improved  with now taking 137 mcg levothyroxine   Lab Results  Component Value Date   TSH 4.66 (H) 12/19/2019   TSH 0.79 07/01/2019   TSH  5.40 (H) 04/24/2019   FREET4 0.77 04/24/2019   FREET4 0.88 08/16/2018   FREET4 0.75 03/12/2018   Has chronic systolic CHF He has some variability in his leg edema but recently appears to have less Currently taking 120 mg Lasix daily without metolazone  His wife can check his blood pressure at home also  Blood pressure history:  BP Readings from Last 3 Encounters:  02/24/20 102/64  01/19/20 102/60  12/19/19 108/70    CKD: Has variable creatinine levels as follows: Regularly followed by nephrologist No history of microalbuminuria   Lab Results  Component Value Date   CREATININE 1.53 (H) 12/19/2019   CREATININE 1.36 (H) 09/25/2019   CREATININE 1.29 (H) 08/19/2019    Neuropathy: Taking gabapentin 100 mg from his PCP. Has history of numbness in his toes  Diabetic foot exam in 08/2018  Last eye exam 11/20, negative  Physical Examination:  BP 102/64   Pulse 72   Ht _0  (1.753 m)   Wt 249 lb (112.9 kg)   SpO2 98%   BMI 36.77 kg/m      ASSESSMENT/PLAN  Diabetes type 2, insulin-dependent with obesity     See history of present illness for detailed discussion of his current management, blood sugar patterns and problems identified  His A1c is last excellent under 6%  He is using the Omni pod insulin pump with the older PDM and also Ozempic 1 mg weekly He has not used the [place] as discussed above  Unlikely that he had an allergic reaction to [place] since his symptoms appear to be related to a dry mouth, mild difficulty swallowing and no symptoms suggestive of anaphylaxis or allergic reactions; most likely he may have had mild dehydration with this  Discussed again the benefits of Farxiga including renal and cardiac which he would benefit from significantly Also may have some benefit with weight loss  Recommendations   He is  going to trial a [place] again as he feels this may be tolerated  He will make sure he is increasing his fluid intake when starting Wilder Glade  To call if he is having any significant difficulty swallowing  May consider Jardiance if [place] causes significant side effects  He will have his renal function checked with upcoming visit with nephrologist  Likely may need further reduction of his basal rates but will wait till he tries to [place] for a few days  His Lasix will be reduced to 80 mg instead of 120 when he takes [place]  He will let us know if he has any changes in blood pressure on the lower side  Because of inadequate fingerstick glucose monitoring he is a good candidate for CGM and will prescribe the Dexcom system, discussed how this works and given him patient information on this.  This may also be interrogated with the closed-loop system when approved for Medicare  Follow-up in 1 month  HYPOTHYROIDISM: TSH to be checked on the next visit  RENAL dysfunction: Needs to be followed    There are no Patient Instructions on file for this visit.        Elayne Snare 02/24/2020, 1:15 PM   Note: This office note was prepared with Dragon voice recognition system technology. Any transcriptional errors that result from this process are unintentional.

## 2020-02-26 DIAGNOSIS — H4031X3 Glaucoma secondary to eye trauma, right eye, severe stage: Secondary | ICD-10-CM | POA: Diagnosis not present

## 2020-02-26 DIAGNOSIS — E119 Type 2 diabetes mellitus without complications: Secondary | ICD-10-CM | POA: Diagnosis not present

## 2020-02-26 DIAGNOSIS — H40032 Anatomical narrow angle, left eye: Secondary | ICD-10-CM | POA: Diagnosis not present

## 2020-02-26 DIAGNOSIS — H04123 Dry eye syndrome of bilateral lacrimal glands: Secondary | ICD-10-CM | POA: Diagnosis not present

## 2020-02-26 LAB — HM DIABETES EYE EXAM

## 2020-02-27 ENCOUNTER — Encounter: Payer: Self-pay | Admitting: Endocrinology

## 2020-03-03 ENCOUNTER — Ambulatory Visit: Payer: Medicare Other | Admitting: Endocrinology

## 2020-03-16 DIAGNOSIS — N189 Chronic kidney disease, unspecified: Secondary | ICD-10-CM | POA: Diagnosis not present

## 2020-03-16 DIAGNOSIS — N1832 Chronic kidney disease, stage 3b: Secondary | ICD-10-CM | POA: Diagnosis not present

## 2020-03-16 DIAGNOSIS — E1122 Type 2 diabetes mellitus with diabetic chronic kidney disease: Secondary | ICD-10-CM | POA: Diagnosis not present

## 2020-03-16 DIAGNOSIS — N2581 Secondary hyperparathyroidism of renal origin: Secondary | ICD-10-CM | POA: Diagnosis not present

## 2020-03-16 DIAGNOSIS — D631 Anemia in chronic kidney disease: Secondary | ICD-10-CM | POA: Diagnosis not present

## 2020-03-16 DIAGNOSIS — I129 Hypertensive chronic kidney disease with stage 1 through stage 4 chronic kidney disease, or unspecified chronic kidney disease: Secondary | ICD-10-CM | POA: Diagnosis not present

## 2020-03-16 DIAGNOSIS — G20 Parkinson's disease: Secondary | ICD-10-CM | POA: Diagnosis not present

## 2020-03-16 DIAGNOSIS — E669 Obesity, unspecified: Secondary | ICD-10-CM | POA: Diagnosis not present

## 2020-03-16 DIAGNOSIS — I509 Heart failure, unspecified: Secondary | ICD-10-CM | POA: Diagnosis not present

## 2020-03-24 ENCOUNTER — Other Ambulatory Visit (INDEPENDENT_AMBULATORY_CARE_PROVIDER_SITE_OTHER): Payer: Medicare Other

## 2020-03-24 ENCOUNTER — Other Ambulatory Visit: Payer: Self-pay

## 2020-03-24 DIAGNOSIS — E1165 Type 2 diabetes mellitus with hyperglycemia: Secondary | ICD-10-CM

## 2020-03-24 DIAGNOSIS — E890 Postprocedural hypothyroidism: Secondary | ICD-10-CM | POA: Diagnosis not present

## 2020-03-24 LAB — BASIC METABOLIC PANEL
BUN: 24 mg/dL — ABNORMAL HIGH (ref 6–23)
CO2: 31 mEq/L (ref 19–32)
Calcium: 9.2 mg/dL (ref 8.4–10.5)
Chloride: 102 mEq/L (ref 96–112)
Creatinine, Ser: 1.61 mg/dL — ABNORMAL HIGH (ref 0.40–1.50)
GFR: 43.63 mL/min — ABNORMAL LOW (ref >60.00)
Glucose, Bld: 110 mg/dL — ABNORMAL HIGH (ref 70–99)
Potassium: 4 mEq/L (ref 3.5–5.1)
Sodium: 143 mEq/L (ref 135–145)

## 2020-03-24 LAB — TSH: TSH: 2.13 u[IU]/mL (ref 0.35–4.50)

## 2020-03-29 ENCOUNTER — Ambulatory Visit (INDEPENDENT_AMBULATORY_CARE_PROVIDER_SITE_OTHER): Payer: Medicare Other | Admitting: Endocrinology

## 2020-03-29 ENCOUNTER — Encounter: Payer: Self-pay | Admitting: Endocrinology

## 2020-03-29 ENCOUNTER — Other Ambulatory Visit: Payer: Self-pay

## 2020-03-29 VITALS — BP 118/78 | HR 69 | Resp 20 | Ht 68.0 in | Wt 253.0 lb

## 2020-03-29 DIAGNOSIS — E1165 Type 2 diabetes mellitus with hyperglycemia: Secondary | ICD-10-CM

## 2020-03-29 DIAGNOSIS — I255 Ischemic cardiomyopathy: Secondary | ICD-10-CM | POA: Diagnosis not present

## 2020-03-29 DIAGNOSIS — E782 Mixed hyperlipidemia: Secondary | ICD-10-CM | POA: Diagnosis not present

## 2020-03-29 DIAGNOSIS — E890 Postprocedural hypothyroidism: Secondary | ICD-10-CM

## 2020-03-29 DIAGNOSIS — N1832 Chronic kidney disease, stage 3b: Secondary | ICD-10-CM

## 2020-03-29 LAB — POCT GLYCOSYLATED HEMOGLOBIN (HGB A1C): Hemoglobin A1C: 5.6 % (ref 4.0–5.6)

## 2020-03-29 NOTE — Progress Notes (Signed)
Patient ID: Henry [name], male   DOB: [birth year], 70 y.o.   MRN: [id number]           Reason for Appointment: Follow-up for Type 2 Diabetes   History of Present Illness:          Diagnosis: Type 2 diabetes mellitus, date of diagnosis: 2000      Past history:   Patient thinks he has been taking Amaryl for several years and probably metformin since onset also At some point he was changed from metformin to [facility] and was taking this since at least 2012 A1c had been higher in 2015 Since his A1c had been progressively higher with his regimen of Janumet and Amaryl he was started on Victoza in 01/2014 He was started on Lantus insulin in 5/16 because of persistent hyperglycemia especially fasting; was having readings as high as 293 Because of tendency to high postprandial readings and for control with Lantus he was switched to the V-go pump in  09/2014  Recent history:    INSULIN regimen: Omnipod insulin pump regional with Humalog insulin   Basal rate programs: Midnight = 1.6, 7 AM = 2.1, 11 a.m. = 2.7, total basal 52 units  Boluses 3-5 units for breakfast, 4-6 lunch and 3 -9 units for dinner  Non-insulin hypoglycemic drugs the patient is taking are: Ozempic 1 mg weekly, Farxiga 5 mg daily  His A1c has been consistently over 8% in the past; last 5.9, previously 5.8   Current management, blood sugar patterns and problems identified:  He is here for short-term follow-up after initiating Farxiga about 7 or 8 weeks ago  At this time he tolerated the [place] without side effects even though previously was concerned it was causing difficulty swallowing  On average his blood sugars are lower without hypoglycemia  As before most likely monitoring glucose before meals but has a couple of good postprandial readings also, highest 171  Still has not been able to get coverage for any continued glucose monitoring devices  Weight has fluctuated some  He is still trying to control his  portions well and avoid snacks at night  No hypoglycemia  His wife still not comfortable trying to program their insulin pump  Hypoglycemia:   none  He has seen the dietitian in 04/2017  Not able to exercise because of back pain and difficulty walking       Side effects from medications have been: None Compliance with the medical regimen: Fair   Glucose monitoring:  Recently usually 3 times  a day         Glucometer:  Freestyle with the OmniPod PDM       Blood Glucose readings by download of pump     PRE-MEAL Fasting Lunch Dinner Bedtime Overall  Glucose range:     70-171  Mean/median:  92  97  100  98   POST-MEAL PC Breakfast PC Lunch PC Dinner  Glucose range:   95, 101  128, 171  Mean/median:      Previously:  PRE-MEAL Fasting Lunch Dinner Bedtime Overall  Glucose range:  85-132  96  86-125  146  85-150  Mean/median:  110   115   112   POST-MEAL PC Breakfast PC Lunch PC Dinner  Glucose range:    150  Mean/median:         Meals: 9 am , 2-3 pm and 6-7 pm            Dietician visit, most recent:  4/19               Weight history:   Wt Readings from Last 3 Encounters:  03/29/20 253 lb (114.8 kg)  02/24/20 249 lb (112.9 kg)  01/19/20 250 lb (113.4 kg)    Glycemic control:   Lab Results  Component Value Date   HGBA1C 5.9 (A) 12/19/2019   HGBA1C 5.8 (A) 09/08/2019   HGBA1C 8.6 (H) 04/24/2019   Lab Results  Component Value Date   MICROALBUR <0.7 12/19/2019   LDLCALC 118 (H) 09/16/2015   CREATININE 1.61 (H) 03/24/2020    Lab Results  Component Value Date   FRUCTOSAMINE 359 (H) 07/01/2019   FRUCTOSAMINE 277 11/09/2017   FRUCTOSAMINE 350 (H) 03/29/2017     Other active problems: See review of systems      Allergies as of 03/29/2020      Reactions   Penicillins Anaphylaxis, Rash   Because of a history of documented adverse serious drug reaction;Medi Alert bracelet  is recommended PATIENT HAS HAD A PCN REACTION WITH IMMEDIATE RASH,  FACIAL/TONGUE/THROAT SWELLING, SOB, OR LIGHTHEADEDNESS WITH HYPOTENSION:  #  #  YES  #  #  Has patient had a PCN reaction causing severe rash involving mucus membranes or skin necrosis: unknown Has patient had a PCN reaction that required hospitalization NO Has patient had a PCN reaction occurring within the last 10 years: NO   Klonopin [clonazepam] Other (See Comments)   agitation   Peanut-containing Drug Products Cough   Watermelon [citrullus Vulgaris] Other (See Comments), Cough   Tickle in throat      Medication List       Accurate as of March 29, 2020  1:04 PM. If you have any questions, ask your nurse or doctor.        acetaminophen 500 MG tablet Commonly known as: TYLENOL Take 1,000 mg by mouth at bedtime as needed for mild pain or headache.   albuterol 108 (90 Base) MCG/ACT inhaler Commonly known as: VENTOLIN HFA Inhale 2 puffs into the lungs every 6 (six) hours as needed for wheezing or shortness of breath.   allopurinol 100 MG tablet Commonly known as: ZYLOPRIM TAKE 1 & 1/2 TABLET BY MOUTH DAILY What changed: See the new instructions.   apixaban 5 MG Tabs tablet Commonly known as: ELIQUIS Take by mouth.   aspirin EC 81 MG tablet Take 1 tablet (81 mg total) by mouth daily. Swallow whole.   carbidopa-levodopa 50-200 MG tablet Commonly known as: SINEMET CR TAKE 1 TABLET BY MOUTH EVERYDAY AT BEDTIME What changed: Another medication with the same name was changed. Make sure you understand how and when to take each.   Carbidopa-Levodopa ER 25-100 MG tablet controlled release Commonly known as: SINEMET CR TAKE 2 TABLETS AT 7AM,THEN 2TABLETS AT 11AM,THEN 2 TABS AT 3PM AND 1TAB AT 7PM,&1TAB AT BEDTIME What changed: See the new instructions.   colchicine 0.6 MG tablet Take 1 tablet (0.6 mg total) by mouth 2 (two) times daily as needed (gout). What changed: when to take this   dapagliflozin propanediol 5 MG Tabs tablet Commonly known as: Farxiga Take 1 tablet (5  mg total) by mouth daily.   Dexcom G6 Sensor Misc Use to monitor blood sugar, change after 10 days   dorzolamide-timolol 22.3-6.8 MG/ML ophthalmic solution Commonly known as: COSOPT Place 1 drop into the right eye 2 (two) times daily.   Fluticasone-Salmeterol 250-50 MCG/DOSE Aepb Commonly known as: ADVAIR Inhale 1 puff into the lungs 2 (two) times  daily.   FREESTYLE LITE test strip Generic drug: glucose blood USE TO CHECK BLOOD SUGAR 4 TIMES DAILY.   furosemide 80 MG tablet Commonly known as: LASIX TAKE 1 & 1/2 TABLETS BY MOUTH DAILY What changed: additional instructions   gabapentin 100 MG capsule Commonly known as: NEURONTIN TAKE 2 CAPSULES BY MOUTH EVERY MORNING AND TAKE 4 CAPSULES BY MOUTH EVERY EVENING   HumaLOG 100 UNIT/ML injection Generic drug: insulin lispro USE MAXIMUM 76 UNITS PER   DAY WITH V-GO PUMP What changed: See the new instructions.   Klor-Con M20 20 MEQ tablet Generic drug: potassium chloride SA TAKE 3 TABLETS (60 MEQ TOTAL) BY MOUTH DAILY.   levothyroxine 137 MCG tablet Commonly known as: SYNTHROID TAKE 1 TABLET (137 MCG TOTAL) BY MOUTH DAILY BEFORE BREAKFAST. What changed: additional instructions   metoprolol succinate 50 MG 24 hr tablet Commonly known as: TOPROL-XL TAKE 1 TABLET BY MOUTH DAILY. TAKE WITH OR IMMEDIATELY FOLLOWING A MEAL.   Myrbetriq 25 MG Tb24 tablet Generic drug: mirabegron ER TAKE 1 TABLET BY MOUTH EVERY DAY   nitroGLYCERIN 0.4 MG SL tablet Commonly known as: NITROSTAT Place 1 tablet (0.4 mg total) under the tongue every 5 (five) minutes as needed for chest pain.   omega-3 acid ethyl esters 1 g capsule Commonly known as: LOVAZA Take 2 capsules (2 g total) by mouth 2 (two) times daily.   OmniPod Dash System Kit 1 each by Does not apply route. Use Dash system for Continuous Blood Glucose Monitoring.   OVER THE COUNTER MEDICATION Apply 1 application topically daily as needed (pain). Theraworx Pain Cream   Ozempic (1  MG/DOSE) 2 MG/1.5ML Sopn Generic drug: Semaglutide (1 MG/DOSE) Inject 1 mg into the skin once a week. What changed: when to take this   Ozempic (1 MG/DOSE) 4 MG/3ML Sopn Generic drug: Semaglutide (1 MG/DOSE) INJECT $RemoveBeforeD'1MG'HbVsLPKGabdRMZ$  SUBCUTANEOUSLY  ONCE A WEEK. What changed: Another medication with the same name was changed. Make sure you understand how and when to take each.   polyethylene glycol 17 g packet Commonly known as: MIRALAX / GLYCOLAX Take 8.5 g by mouth daily.   rosuvastatin 20 MG tablet Commonly known as: CRESTOR Take 1 tablet (20 mg total) by mouth at bedtime.   SYSTANE BALANCE OP Place 1 drop into the left eye daily as needed (dry eyes). Uses Systane gel apply to the eye as needed   traMADol 50 MG tablet Commonly known as: ULTRAM TAKE 1 TABLET BY MOUTH TWICE A DAY   vitamin B-12 1000 MCG tablet Commonly known as: CYANOCOBALAMIN Take 1,000 mcg by mouth daily.   Vitamin D 50 MCG (2000 UT) tablet Take 2,000 Units by mouth daily.       Allergies:  Allergies  Allergen Reactions  . Penicillins Anaphylaxis and Rash    Because of a history of documented adverse serious drug reaction;Medi Alert bracelet  is recommended PATIENT HAS HAD A PCN REACTION WITH IMMEDIATE RASH, FACIAL/TONGUE/THROAT SWELLING, SOB, OR LIGHTHEADEDNESS WITH HYPOTENSION:  #  #  YES  #  #  Has patient had a PCN reaction causing severe rash involving mucus membranes or skin necrosis: unknown Has patient had a PCN reaction that required hospitalization NO Has patient had a PCN reaction occurring within the last 10 years: NO  . Klonopin [Clonazepam] Other (See Comments)    agitation  . Peanut-Containing Drug Products Cough  . Watermelon [Citrullus Vulgaris] Other (See Comments) and Cough    Tickle in throat    Past Medical History:  Diagnosis Date  . AICD (automatic cardioverter/defibrillator) present    Dr Lovena Le office visit yearly, MDT  medtronic   . Arthritis    cane  . Asthma   . Benign  neoplasm of colon   . CAD (coronary artery disease)   . Cardiomyopathy   . Cataract    removed  . Complication of anesthesia    pt states that he got a rash  . Constipation   . 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  . Dysrhythmia    a-fib  . GERD (gastroesophageal reflux disease)   . Glaucoma    right eye  . History of kidney stones    multiple  . HLD (hyperlipidemia)   . HTN (hypertension)    pt denies 08/19/12  . Hyperplasia, prostate   . Hyperthyroidism    thyroid lobectomy  . Hypothyroidism   . MI (myocardial infarction) [facility])    Dr Stanford Breed 2000, x3vessels bypass  . Nephrolithiasis   . OSA (obstructive sleep apnea)    AHI-28,on CPAP, noncompliant with CPAP  . Parkinson disease ([facility])    1999  . PONV (postoperative nausea and vomiting)   . Restless legs   . Shortness of breath    Hx: of at all times  . Sleep apnea    does not use CPAP  . Sleep apnea, organic   . UTI (lower urinary tract infection) 09/15/12   Klebsiella  . Ventral hernia   . Walker as ambulation aid    also uses wheelchair at home/when going out    Past Surgical History:  Procedure Laterality Date  . 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 BIOPSY  10/14/2018   Procedure: BIOPSY;  Surgeon: Jerene Bears, MD;  Location: [facility];  Service: Gastroenterology;;  . CARDIAC CATHETERIZATION    . CATARACT EXTRACTION W/ INTRAOCULAR LENS IMPLANT     Hx: of right eye  . CATARACT EXTRACTION W/ INTRAOCULAR LENS IMPLANT Left 2018  . COLONOSCOPY N/A 10/13/2014   Procedure: COLONOSCOPY;  Surgeon: Jerene Bears, MD;  Location: [facility] END[facility];  Service: Gastroenterology;  Laterality: N/A;  . COLONOSCOPY W/ BIOPSIES AND POLYPECTOMY     Hx: of  . COLONOSCOPY WITH PROPOFOL N/A 10/14/2018   Procedure: COLONOSCOPY WITH PROPOFOL;  Surgeon: Jerene Bears, MD;  Location: [facility] ENDOSCOPY;  Service: Gastroenterology;  Laterality:  N/A;  . CORONARY ARTERY BYPASS GRAFT  2000   [name], MD  . [place]  . DEEP BRAIN STIMULATOR PLACEMENT  2004   Right and left VIN stimulator placement (parkinsons)  . EYE SURGERY    . FINGER AMPUTATION     left pointer  . IMPLANTABLE CARDIOVERTER DEFIBRILLATOR IMPLANT N/A 11/13/2013   Procedure: IMPLANTABLE CARDIOVERTER DEFIBRILLATOR IMPLANT;  Surgeon: Evans Lance, MD;  Location: [facility] C[facility] LAB;  Service: Cardiovascular;  Laterality: N/A;  . INSERT / REPLACE / REMOVE PACEMAKER     medtronic  . LEFT AND RIGHT HEART CATHETERIZATION WITH CORONARY ANGIOGRAM N/A 09/24/2013   Procedure: LEFT AND RIGHT HEART CATHETERIZATION WITH CORONARY ANGIOGRAM;  Surgeon: Burnell Blanks, MD;  Location[facility] C[facility] LAB;  Service: Cardiovascular;  Laterality: N/A;  . LITHOTRIPSY     3 different times  . MEDIAN STERNOTOMY  2000  . POLYPECTOMY  10/14/2018   Procedure: POLYPECTOMY;  Surgeon: Jerene Bears, MD;  Location: [facility];  Service: Gastroenterology;;  . PULSE GENERATOR IMPLANT Right 11/13/2017   Procedure: Right chest implantable pulse generator change;  Surgeon: Erline Levine, MD;  Location: [place];  Service: Neurosurgery;  Laterality: Right;  Right chest implantable pulse generator change  . SUBTHALAMIC STIMULATOR BATTERY REPLACEMENT N/A 09/05/2012   Procedure: Deep brain stimulator battery change;  Surgeon: Erline Levine, MD;  Location: [place] [facility];  Service: Neurosurgery;  Laterality: N/A;  Deep brain stimulator battery change  . SUBTHALAMIC STIMULATOR BATTERY REPLACEMENT N/A 06/10/2015   Procedure: Deep Brain stimulator battery change;  Surgeon: Erline Levine, MD;  Location: [place] NEURO [facility];  Service: Neurosurgery;  Laterality: N/A;  . SUBTHALAMIC STIMULATOR BATTERY REPLACEMENT Right 09/25/2019   Procedure: Deep brain stimulator battery change;  Surgeon: Erline Levine, MD;  Location: [place];  Service: Neurosurgery;  Laterality: Right;  . TONSILLECTOMY      Family History   Problem Relation Age of Onset  . Aneurysm Mother   . Alcoholism Father   . HIV/AIDS Brother 58       AIDS  . Healthy Sister   . Healthy Child   . Peripheral vascular disease Other   . Arthritis Other   . Healthy Sister   . Healthy Child   . Healthy Child   . Diabetes Neg Hx   . Heart disease Neg Hx   . Colon cancer Neg Hx   . Prostate cancer Neg Hx     Social History:  reports that he has never smoked. He has never used smokeless tobacco. He reports previous alcohol use. He reports that he does not use drugs.    Review of Systems     HYPERLIPIDEMIA:  He is on Crestor 20 mg with good control of LDL, has low HDL Triglycerides are persistently over 200, treated with Lovaza 2 g twice a day       Lab Results  Component Value Date   CHOL 130 04/24/2019   CHOL 116 09/18/2018   CHOL 130 03/12/2018   Lab Results  Component Value Date   HDL 23.20 (L) 04/24/2019   HDL 25.80 (L) 09/18/2018   HDL 29.00 (L) 03/12/2018   Lab Results  Component Value Date   LDLCALC 118 (H) 09/16/2015   LDLCALC 126 (H) 04/08/2013   LDLCALC 51 02/28/2012   Lab Results  Component Value Date   TRIG 297.0 (H) 04/24/2019   TRIG 270.0 (H) 09/18/2018   TRIG 257.0 (H) 03/12/2018   Lab Results  Component Value Date   CHOLHDL 6 04/24/2019   CHOLHDL 4 09/18/2018   CHOLHDL 4 03/12/2018   Lab Results  Component Value Date   LDLDIRECT 51.0 04/24/2019   LDLDIRECT 54.0 09/18/2018   LDLDIRECT 67.0 03/12/2018                  Thyroid:   He has had hypothyroidism following radioactive iodine treatment for hyperthyroidism in 2013   Last TSH has been normal with now taking 137 mcg levothyroxine   Lab Results  Component Value Date   TSH 2.13 03/24/2020   TSH 4.66 (H) 12/19/2019   TSH 0.79 07/01/2019   FREET4 0.77 04/24/2019   FREET4 0.88 08/16/2018   FREET4 0.75 03/12/2018   Has chronic systolic CHF He has some variability in his leg edema but recently appears to have less Currently  taking 80 mg Lasix daily without metolazone  His wife can check his blood pressure at home   Blood pressure history:  BP Readings from Last 3  Encounters:  03/29/20 118/78  02/24/20 102/64  01/19/20 102/60    CKD: Has variable creatinine levels as follows: Regularly followed by nephrologist, has been recommended 10 mg Wilder Glade for kidney protection No history of microalbuminuria   Lab Results  Component Value Date   CREATININE 1.61 (H) 03/24/2020   CREATININE 1.53 (H) 12/19/2019   CREATININE 1.36 (H) 09/25/2019    Neuropathy: Taking gabapentin 100 mg from his PCP. Has history of numbness in his toes  Diabetic foot exam in 08/2018  Last eye exam 11/20, negative  Physical Examination:  BP 118/78 (BP Location: Right Arm, Patient Position: Sitting, Cuff Size: Large)   Pulse 69   Resp 20   Ht $R'5\' 8"'nQ$  (1.727 m)   Wt 253 lb (114.8 kg)   SpO2 99%   BMI 38.47 kg/m   No lower leg edema present  ASSESSMENT/PLAN  Diabetes type 2, insulin-dependent with obesity     See history of present illness for detailed discussion of his current management, blood sugar patterns and problems identified  His A1c is consistently below 6%  He is using the Omni pod insulin pump with the older PDM, now [place] 5 mg and also Ozempic 1 mg weekly His blood sugars are somewhat better with starting [place] and averaging frequently below 100 most of the time Also is not appearing to be having significantly high postprandial readings when checked Likely needs less basal insulin and also boluses Diet has been excellent but has not lost weight  Creatinine is slightly higher with recently starting [place] but not significantly  Recommendations   He is going to find out about the coverage for either Dexcom or freestyle libre from [place] [facility]  Because of his complex medication regimen we will hold off on increasing his Wilder Glade to 10 mg as yet and see if her kidney function is stable on the next  visit before doing so, currently getting benefit from the low-dose for his diabetes control at least and also requiring a little less diuretic  Basal and bolus settings were reduced on his pump as follows in the office   12 AM = 1.5 7 AM = 119, 11 AM = 2.5 Carbohydrate coverage 1: 10 and sensitivity 1: 60  Follow-up in 3 month  HYPOTHYROIDISM: TSH recently normal  RENAL dysfunction: Recently stable although not as good as last year    There are no Patient Instructions on file for this visit.     Total visit time including counseling #30 minutes   Elayne Snare 03/29/2020, 1:04 PM   Note: This office note was prepared with Dragon voice recognition system technology. Any transcriptional errors that result from this process are unintentional.

## 2020-04-13 ENCOUNTER — Other Ambulatory Visit: Payer: Self-pay | Admitting: Endocrinology

## 2020-04-13 ENCOUNTER — Other Ambulatory Visit: Payer: Self-pay | Admitting: Family Medicine

## 2020-04-14 NOTE — Telephone Encounter (Signed)
Please schedule patient a follow up appt

## 2020-04-14 NOTE — Telephone Encounter (Signed)
Refill request for Tramadol 50 mg tablets  LOV - 10/28/19 Next OV - not scheduled Last refill - 01/14/20 #60/2

## 2020-04-14 NOTE — Telephone Encounter (Signed)
Sent. Thanks.  Reasonable for periodic OV when possible.  We can do labs at the visit if needed.

## 2020-04-16 ENCOUNTER — Encounter: Payer: Self-pay | Admitting: Family Medicine

## 2020-04-16 MED ORDER — METOPROLOL SUCCINATE ER 50 MG PO TB24: 50.0000 mg | ORAL_TABLET | Freq: Every day | ORAL | 1 refills | Status: DC

## 2020-04-20 ENCOUNTER — Other Ambulatory Visit: Payer: Self-pay | Admitting: Neurology

## 2020-04-20 NOTE — Telephone Encounter (Signed)
Rx(s) sent to pharmacy electronically.  

## 2020-04-21 ENCOUNTER — Ambulatory Visit (INDEPENDENT_AMBULATORY_CARE_PROVIDER_SITE_OTHER): Payer: Medicare Other

## 2020-04-21 DIAGNOSIS — I255 Ischemic cardiomyopathy: Secondary | ICD-10-CM | POA: Diagnosis not present

## 2020-04-22 ENCOUNTER — Telehealth: Payer: Self-pay | Admitting: Emergency Medicine

## 2020-04-22 NOTE — Telephone Encounter (Signed)
Patient waiting on home recorder 04/22/20.

## 2020-04-23 LAB — CUP PACEART REMOTE DEVICE CHECK
Battery Remaining Longevity: 34 mo
Battery Voltage: 2.92 V
Brady Statistic AP VP Percent: 0 %
Brady Statistic AP VS Percent: 0.78 %
Brady Statistic AS VP Percent: 0.07 %
Brady Statistic AS VS Percent: 99.15 %
Brady Statistic RA Percent Paced: 0.79 %
Brady Statistic RV Percent Paced: 0.07 %
Date Time Interrogation Session: 20220414130616
HighPow Impedance: 72 Ohm
Implantable Lead Implant Date: 20151105
Implantable Lead Implant Date: 20151105
Implantable Lead Location: 753859
Implantable Lead Location: 753860
Implantable Lead Model: 5076
Implantable Pulse Generator Implant Date: 20151105
Lead Channel Impedance Value: 437 Ohm
Lead Channel Impedance Value: 437 Ohm
Lead Channel Impedance Value: 494 Ohm
Lead Channel Pacing Threshold Amplitude: 0.375 V
Lead Channel Pacing Threshold Amplitude: 1.25 V
Lead Channel Pacing Threshold Pulse Width: 0.4 ms
Lead Channel Pacing Threshold Pulse Width: 0.4 ms
Lead Channel Sensing Intrinsic Amplitude: 18.625 mV
Lead Channel Sensing Intrinsic Amplitude: 18.625 mV
Lead Channel Sensing Intrinsic Amplitude: 2.25 mV
Lead Channel Sensing Intrinsic Amplitude: 2.25 mV
Lead Channel Setting Pacing Amplitude: 2.5 V
Lead Channel Setting Pacing Amplitude: 2.5 V
Lead Channel Setting Pacing Pulse Width: 0.4 ms
Lead Channel Setting Sensing Sensitivity: 0.3 mV

## 2020-04-23 NOTE — Telephone Encounter (Signed)
Scheduled transmission received 04/22/20.

## 2020-05-03 ENCOUNTER — Other Ambulatory Visit: Payer: Self-pay | Admitting: Endocrinology

## 2020-05-05 NOTE — Progress Notes (Signed)
Remote ICD transmission.   

## 2020-05-13 ENCOUNTER — Other Ambulatory Visit: Payer: Self-pay | Admitting: Neurology

## 2020-05-13 ENCOUNTER — Other Ambulatory Visit: Payer: Self-pay

## 2020-05-13 ENCOUNTER — Ambulatory Visit (INDEPENDENT_AMBULATORY_CARE_PROVIDER_SITE_OTHER): Payer: Medicare Other | Admitting: Family Medicine

## 2020-05-13 ENCOUNTER — Encounter: Payer: Self-pay | Admitting: Family Medicine

## 2020-05-13 DIAGNOSIS — G20 Parkinson's disease: Secondary | ICD-10-CM

## 2020-05-13 DIAGNOSIS — I10 Essential (primary) hypertension: Secondary | ICD-10-CM | POA: Diagnosis not present

## 2020-05-13 DIAGNOSIS — E890 Postprocedural hypothyroidism: Secondary | ICD-10-CM | POA: Diagnosis not present

## 2020-05-13 DIAGNOSIS — I255 Ischemic cardiomyopathy: Secondary | ICD-10-CM | POA: Diagnosis not present

## 2020-05-13 DIAGNOSIS — J45909 Unspecified asthma, uncomplicated: Secondary | ICD-10-CM | POA: Diagnosis not present

## 2020-05-13 MED ORDER — FUROSEMIDE 80 MG PO TABS: 80.0000 mg | ORAL_TABLET | Freq: Every day | ORAL | 1 refills | Status: DC

## 2020-05-13 MED ORDER — LEVOTHYROXINE SODIUM 137 MCG PO TABS: 137.0000 ug | ORAL_TABLET | Freq: Every day | ORAL | Status: DC

## 2020-05-13 MED ORDER — FLUTICASONE-SALMETEROL 250-50 MCG/ACT IN AEPB: 1.0000 | INHALATION_SPRAY | Freq: Two times a day (BID) | RESPIRATORY_TRACT | 3 refills | Status: DC

## 2020-05-13 MED ORDER — ALBUTEROL SULFATE HFA 108 (90 BASE) MCG/ACT IN AERS: 2.0000 | INHALATION_SPRAY | Freq: Four times a day (QID) | RESPIRATORY_TRACT | 5 refills | Status: DC | PRN

## 2020-05-13 MED ORDER — COLCHICINE 0.6 MG PO TABS: 0.6000 mg | ORAL_TABLET | Freq: Every day | ORAL | Status: DC | PRN

## 2020-05-13 NOTE — Patient Instructions (Signed)
Try using adviar twice a day and rinse after use.  Then only use albuterol if needed.  Take care.  Glad to see you. I'll update Dr. Carles Collet in the meantime.

## 2020-05-13 NOTE — Progress Notes (Signed)
This visit occurred during the SARS-CoV-2 public health emergency.  Safety protocols were in place, including screening questions prior to the visit, additional usage of staff PPE, and extensive cleaning of exam room while observing appropriate contact time as indicated for disinfecting solutions.  He isn't lightheaded.  Down to 80mg  lasix since taking 5mg  farxiga.    advair use d/w pt.  Used more in the spring.  It helps with chest tightness.  Discussed routine use with rinsing, etc.  He has gait changes from parkinson's at baseline.  Magnetic gait- "Like my feet are stuck to the floor".  I told him I would update neurology.  Compliant with Sinemet at baseline.  He still has a soft umbilical hernia.  No pain.  He is not interested in intervention, nor would not likely be appropriate at this point.  Off anticoagulation per cardiology recommendation.  Meds, vitals, and allergies reviewed.   ROS: Per HPI unless specifically indicated in ROS section   In wheelchair at baseline. nad ncat Neck supple, no LA rrr ctab abd soft, not ttp, soft umbilical hernia noted. Trace BLE  Skin well perfused.

## 2020-05-14 NOTE — Telephone Encounter (Signed)
1 month refill sent. Patient may get refill at f/u appt 05/2020

## 2020-05-16 ENCOUNTER — Encounter: Payer: Self-pay | Admitting: Family Medicine

## 2020-05-16 DIAGNOSIS — J45909 Unspecified asthma, uncomplicated: Secondary | ICD-10-CM | POA: Insufficient documentation

## 2020-05-16 NOTE — Assessment & Plan Note (Signed)
Would continue Lasix 80 mg daily.  He will update me as needed.

## 2020-05-16 NOTE — Assessment & Plan Note (Signed)
Try using adviar twice a day and rinse after use.  Then only use albuterol if needed.  He will update me as needed.

## 2020-05-16 NOTE — Assessment & Plan Note (Signed)
I told him I will update neurology.  I appreciate input from Dr. Carles Collet.  He has difficulty with magnetic type gait that was demonstrated on standing today.

## 2020-05-18 NOTE — Progress Notes (Deleted)
Assessment/Plan:   1.  Parkinsons Disease             -Status post bilateral DBS STN surgery             -Battery changes occurring September 05, 2012; June 10, 2015; November 13, 2017; 09/25/19             -would like patient to not ambulate at all unless directly monitored.  Otherwise, I want him to stay in his wheelchair.             -Take carbidopa/levodopa 25/100 CR, 2 tablets at 7 AM/2 tablets at 11 AM/2 tablets at 3 PM/1 tablet at 7 PM             -Take carbidopa/levodopa 50/200 CR at bedtime 2. RBD -He has tried clonazepam several times and ultimately ends up stopping it because of side effect. Could try Rozerem in the future if insurance would pay. Decided to hold off for now as the patient states that while dreaming is vivid, it is not particularly bothersome and he is not falling out of the bed.Discussed bedroom safety. 3. Pseudobulbar affect (crying) -Wants no medication for that but wife does admit that this is becoming worse 4. OSAS, noncompliant with CPAP. -Patient aware of risks/morbidity and mortality associated with untreated sleep apnea 5. CHF -ICD placed on 11/15/2013  -DBS will need placed into special mode if pt ever requires cardioversion 6. Facial paresthesias -Has been going on forfew years and became bilateral, not c/w TN. He has had several negative noncontrast CTs of the brain. Kidney function is not good enough for contrasted CT. His DBS device is not MRI compatible. -I am unsure what this is, and offered to try carbamazepine, but he really did not want that. -I have turned off his DBS and he noticed no change in the facial paresthesias.    Subjective:   Henry [name] was seen today in follow up for Parkinsons disease.  My previous records were reviewed prior to todays visit as well as outside records available to me.  Patient with wife who supplements history.   He does have some intermittent tremor.  He has trouble with freezing of gait.  Current prescribed movement disorder medications: Carbidopa/levodopa 25/100 CR, 2 tablets at 7 AM/11 AM/3 PM/1 tablet at 7 PM Carbidopa/levodopa 50/200 CR at bedtime    ALLERGIES:   Allergies  Allergen Reactions  . Penicillins Anaphylaxis and Rash    Because of a history of documented adverse serious drug reaction;Medi Alert bracelet  is recommended PATIENT HAS HAD A PCN REACTION WITH IMMEDIATE RASH, FACIAL/TONGUE/THROAT SWELLING, SOB, OR LIGHTHEADEDNESS WITH HYPOTENSION:  #  #  YES  #  #  Has patient had a PCN reaction causing severe rash involving mucus membranes or skin necrosis: unknown Has patient had a PCN reaction that required hospitalization NO Has patient had a PCN reaction occurring within the last 10 years: NO  . Klonopin [Clonazepam] Other (See Comments)    agitation  . Peanut-Containing Drug Products Cough  . Watermelon [Citrullus Vulgaris] Other (See Comments) and Cough    Tickle in throat    CURRENT MEDICATIONS:  Outpatient Encounter Medications as of 05/24/2020  Medication Sig  . acetaminophen (TYLENOL) 500 MG tablet Take 1,000 mg by mouth at bedtime as needed for mild pain or headache.   . albuterol (VENTOLIN HFA) 108 (90 Base) MCG/ACT inhaler Inhale 2 puffs into the lungs every 6 (six) hours as needed for wheezing  or shortness of breath.  . allopurinol (ZYLOPRIM) 100 MG tablet TAKE 1 & 1/2 TABLET BY MOUTH DAILY  . aspirin EC 81 MG tablet Take 1 tablet (81 mg total) by mouth daily. Swallow whole.  . carbidopa-levodopa (SINEMET CR) 50-200 MG tablet TAKE 1 TABLET BY MOUTH EVERYDAY AT BEDTIME  . Carbidopa-Levodopa ER (SINEMET CR) 25-100 MG tablet controlled release TAKE 2 TABLETS AT 7AM,THEN 2TABLETS AT 11AM,THEN 2 TABS AT 3PM AND 1TAB AT 7PM  . Cholecalciferol (VITAMIN D) 50 MCG (2000 UT) tablet Take 2,000 Units by mouth daily.   . colchicine 0.6 MG tablet Take 1 tablet (0.6 mg total) by  mouth daily as needed (gout).  . dapagliflozin propanediol (FARXIGA) 5 MG TABS tablet Take 1 tablet (5 mg total) by mouth daily.  . dorzolamide-timolol (COSOPT) 22.3-6.8 MG/ML ophthalmic solution Place 1 drop into the right eye 2 (two) times daily.  . fluticasone-salmeterol (ADVAIR DISKUS) 250-50 MCG/ACT AEPB Inhale 1 puff into the lungs in the morning and at bedtime. Rinse after use.  Marland Kitchen FREESTYLE LITE test strip USE TO CHECK BLOOD SUGAR 4 TIMES DAILY.  . furosemide (LASIX) 80 MG tablet Take 1 tablet (80 mg total) by mouth daily.  Marland Kitchen gabapentin (NEURONTIN) 100 MG capsule TAKE 2 CAPSULES BY MOUTH EVERY MORNING AND TAKE 4 CAPSULES BY MOUTH EVERY EVENING  . HUMALOG 100 UNIT/ML injection USE MAXIMUM 76 UNITS PER   DAY WITH V-GO PUMP  . Insulin Disposable Pump (OMNIPOD DASH SYSTEM) KIT 1 each by Does not apply route. Use Dash system for Continuous Blood Glucose Monitoring.  Marland Kitchen KLOR-CON M20 20 MEQ tablet TAKE 3 TABLETS (60 MEQ TOTAL) BY MOUTH DAILY.  Marland Kitchen levothyroxine (SYNTHROID) 137 MCG tablet Take 1 tablet (137 mcg total) by mouth daily before breakfast. Takes additional half tablet once a week  . metoprolol succinate (TOPROL-XL) 50 MG 24 hr tablet Take 1 tablet (50 mg total) by mouth daily. TAKE WITH OR IMMEDIATELY FOLLOWING A MEAL.  Marland Kitchen MYRBETRIQ 25 MG TB24 tablet TAKE 1 TABLET BY MOUTH EVERY DAY  . nitroGLYCERIN (NITROSTAT) 0.4 MG SL tablet Place 1 tablet (0.4 mg total) under the tongue every 5 (five) minutes as needed for chest pain.  Marland Kitchen omega-3 acid ethyl esters (LOVAZA) 1 g capsule Take 2 capsules (2 g total) by mouth 2 (two) times daily.  Marland Kitchen OVER THE COUNTER MEDICATION Apply 1 application topically daily as needed (pain). Theraworx Pain Cream  . OZEMPIC, 1 MG/DOSE, 4 MG/3ML SOPN INJECT $RemoveBef'1MG'[id number]$  SUBCUTANEOUSLY  ONCE A WEEK.  . polyethylene glycol (MIRALAX / GLYCOLAX) packet Take 8.5 g by mouth daily.  Marland Kitchen Propylene Glycol (SYSTANE BALANCE OP) Place 1 drop into the left eye daily as needed (dry eyes). Uses  Systane gel apply to the eye as needed  . rosuvastatin (CRESTOR) 20 MG tablet Take 1 tablet (20 mg total) by mouth at bedtime.  . traMADol (ULTRAM) 50 MG tablet TAKE 1 TABLET BY MOUTH TWICE A DAY  . vitamin B-12 (CYANOCOBALAMIN) 1000 MCG tablet Take 1,000 mcg by mouth daily.   No facility-administered encounter medications on file as of 05/24/2020.    Objective:   PHYSICAL EXAMINATION:    VITALS:   There were no vitals filed for this visit.  GEN:  The patient appears stated age and is in NAD. HEENT:  Normocephalic, atraumatic.  The mucous membranes are moist. The superficial temporal arteries are without ropiness or tenderness.   Neurological examination:  Orientation: The patient is alert and oriented x3. Cranial nerves: There is  good facial symmetry with facial hypomimia. The speech is fluent and dysarthric (baseline) he is hypophonic.Marland Kitchen Soft palate rises symmetrically and there is no tongue deviation. Hearing is intact to conversational tone. Sensation: Sensation is intact to light touch throughout Motor: Strength is at least antigravity x4.  Movement examination: Tone: There is normal tone in the upper and lower extremities following program change (prior to programming there was mild increased tone in the left upper extremity) Abnormal movements: occasional LLE rest tremor prior to programming Coordination:  There is decremation with RAM's, mostly with foot taps on the left.  Better prior to programming Gait and Station: The patient is helped out of the chair.  Has start hesitation.  Holds onto examiner until he is given his walker.  He actually walks pretty well with good stride length with it.  He even turns well with it.  He festinates some.   Once that is taken away, he shuffles again and is short stepped.  I have reviewed and interpreted the following labs independently    Chemistry      Component Value Date/Time   NA 143 03/24/2020 0829   NA 142 12/22/2016 1233   K 4.0  03/24/2020 0829   CL 102 03/24/2020 0829   CO2 31 03/24/2020 0829   BUN 24 (H) 03/24/2020 0829   BUN 18 07/01/2018 0000   CREATININE 1.61 (H) 03/24/2020 0829   CREATININE 2.13 06/25/2017 0000   CREATININE 1.53 (H) 05/22/2016 1106      Component Value Date/Time   CALCIUM 9.2 03/24/2020 0829   ALKPHOS 104 08/16/2018 0916   AST 8 08/16/2018 0916   ALT 8 08/16/2018 0916   BILITOT 0.5 08/16/2018 0916       Lab Results  Component Value Date   WBC 6.2 09/25/2019   HGB 13.9 09/25/2019   HCT 43.7 09/25/2019   MCV 86.9 09/25/2019   PLT 195 09/25/2019    Lab Results  Component Value Date   TSH 2.13 03/24/2020     Total time spent on today's visit was *** minutes, including both face-to-face time and nonface-to-face time.  Time included that spent on review of records (prior notes available to me/labs/imaging if pertinent), discussing treatment and goals, answering patient's questions and coordinating care.  This was not inclusive of DBS time.    Cc:  Tonia Ghent, MD

## 2020-05-19 ENCOUNTER — Other Ambulatory Visit: Payer: Self-pay | Admitting: Neurology

## 2020-05-19 NOTE — Telephone Encounter (Signed)
An order was sent to his pharmacy for this on 5/6.  Call pharmacy and make sure they got it

## 2020-05-19 NOTE — Progress Notes (Signed)
HPI: FU coronary artery disease status post bypass and graft, Parkinson's, diabetes and hypertension as well as hyperlipidemia. Carotid Dopplers in January 2005 showed 0-39% stenosis. Last nuclear study 6/15 showed EF 41, prior MI; no ischemia. Cardiac catheterization September 2015 showed a pulmonary Wedge pressure of 16 and PA pressure 32/20. The LAD was occluded. No obstructive disease in the circumflex. The right coronary had severe diffuse distal disease. Saphenous vein graft to the PDA was patent. Saphenous vein graft to diagonal patent. LIMA to the mid LAD patent. Medical therapy recommended. Had ICD placed November 2015.  Echocardiogram May 2021 showed ejection fraction 40 to 45% with akinesis of the anteroseptal wall and apex. Patient noted to have atrial fibrillation on device interrogation previously. He was started on anticoagulation.  He is asymptomatic with his episodes. Since last seen, he notes some dyspnea unchanged.  No chest pain, palpitations or syncope.  Minimal pedal edema.  Current Outpatient Medications  Medication Sig Dispense Refill  . acetaminophen (TYLENOL) 500 MG tablet Take 1,000 mg by mouth at bedtime as needed for mild pain or headache.     . albuterol (VENTOLIN HFA) 108 (90 Base) MCG/ACT inhaler Inhale 2 puffs into the lungs every 6 (six) hours as needed for wheezing or shortness of breath. 18 g 5  . allopurinol (ZYLOPRIM) 100 MG tablet TAKE 1 & 1/2 TABLET BY MOUTH DAILY 135 tablet 3  . aspirin EC 81 MG tablet Take 1 tablet (81 mg total) by mouth daily. Swallow whole. 90 tablet 3  . carbidopa-levodopa (SINEMET CR) 50-200 MG tablet TAKE 1 TABLET BY MOUTH EVERYDAY AT BEDTIME 90 tablet 0  . Carbidopa-Levodopa ER (SINEMET CR) 25-100 MG tablet controlled release TAKE 2 TABLETS AT 7AM,THEN 2TABLETS AT 11AM,THEN 2 TABS AT 3PM AND 1TAB AT 7PM 210 tablet 0  . Cholecalciferol (VITAMIN D) 50 MCG (2000 UT) tablet Take 2,000 Units by mouth daily.     . ciprofloxacin (CIPRO)  500 MG tablet Take 1 tablet (500 mg total) by mouth 2 (two) times daily for 10 days. 20 tablet 0  . colchicine 0.6 MG tablet Take 1 tablet (0.6 mg total) by mouth daily as needed (gout).    . dorzolamide-timolol (COSOPT) 22.3-6.8 MG/ML ophthalmic solution Place 1 drop into the right eye 2 (two) times daily.    Marland Kitchen FARXIGA 5 MG TABS tablet TAKE 1 TABLET (5 MG TOTAL) BY MOUTH DAILY. 30 tablet 3  . fluticasone-salmeterol (ADVAIR DISKUS) 250-50 MCG/ACT AEPB Inhale 1 puff into the lungs in the morning and at bedtime. Rinse after use. 180 each 3  . FREESTYLE LITE test strip USE TO CHECK BLOOD SUGAR 4 TIMES DAILY. 150 strip 3  . furosemide (LASIX) 80 MG tablet Take 1 tablet (80 mg total) by mouth daily. 90 tablet 1  . gabapentin (NEURONTIN) 100 MG capsule TAKE 2 CAPSULES BY MOUTH EVERY MORNING AND TAKE 4 CAPSULES BY MOUTH EVERY EVENING 540 capsule 1  . HUMALOG 100 UNIT/ML injection USE MAXIMUM 76 UNITS PER   DAY WITH V-GO PUMP 60 mL 3  . Insulin Disposable Pump (OMNIPOD DASH SYSTEM) KIT 1 each by Does not apply route. Use Dash system for Continuous Blood Glucose Monitoring.    Marland Kitchen KLOR-CON M20 20 MEQ tablet TAKE 3 TABLETS (60 MEQ TOTAL) BY MOUTH DAILY. 270 tablet 1  . levothyroxine (SYNTHROID) 137 MCG tablet Take 1 tablet (137 mcg total) by mouth daily before breakfast. Takes additional half tablet once a week    . metoprolol  succinate (TOPROL-XL) 50 MG 24 hr tablet Take 1 tablet (50 mg total) by mouth daily. TAKE WITH OR IMMEDIATELY FOLLOWING A MEAL. 90 tablet 1  . MYRBETRIQ 25 MG TB24 tablet TAKE 1 TABLET BY MOUTH EVERY DAY 30 tablet 5  . omega-3 acid ethyl esters (LOVAZA) 1 g capsule Take 2 capsules (2 g total) by mouth 2 (two) times daily. 360 capsule 1  . OVER THE COUNTER MEDICATION Apply 1 application topically daily as needed (pain). Theraworx Pain Cream    . OZEMPIC, 1 MG/DOSE, 4 MG/3ML SOPN INJECT 1MG SUBCUTANEOUSLY  ONCE A WEEK. 9 mL 3  . polyethylene glycol (MIRALAX / GLYCOLAX) packet Take 8.5 g by  mouth daily.    Marland Kitchen Propylene Glycol (SYSTANE BALANCE OP) Place 1 drop into the left eye daily as needed (dry eyes). Uses Systane gel apply to the eye as needed    . rosuvastatin (CRESTOR) 20 MG tablet TAKE 1 TABLET BY MOUTH EVERYDAY AT BEDTIME 90 tablet 1  . traMADol (ULTRAM) 50 MG tablet TAKE 1 TABLET BY MOUTH TWICE A DAY 60 tablet 2  . vitamin B-12 (CYANOCOBALAMIN) 1000 MCG tablet Take 1,000 mcg by mouth daily.    . nitroGLYCERIN (NITROSTAT) 0.4 MG SL tablet Place 1 tablet (0.4 mg total) under the tongue every 5 (five) minutes as needed for chest pain. 25 tablet 12   No current facility-administered medications for this visit.     Past Medical History:  Diagnosis Date  . AICD (automatic cardioverter/defibrillator) present    Dr Lovena Le office visit yearly, MDT  medtronic   . Arthritis    cane  . Asthma   . CAD (coronary artery disease)   . Cardiomyopathy   . Cataract    removed  . Complication of anesthesia    pt states that he got a rash  . Constipation   . 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  . Dysrhythmia    a-fib  . GERD (gastroesophageal reflux disease)   . Glaucoma    right eye  . History of kidney stones    multiple  . HLD (hyperlipidemia)   . HTN (hypertension)    pt denies 08/19/12  . Hyperplasia, prostate   . Hypothyroidism   . MI (myocardial infarction) [facility])    Dr Stanford Breed 2000, x3vessels bypass  . OSA (obstructive sleep apnea)    AHI-28,on CPAP, noncompliant with CPAP  . Parkinson disease ([place])    1999  . PONV (postoperative nausea and vomiting)   . Restless legs   . Shortness of breath    Hx: of at all times  . UTI (lower urinary tract infection) 09/15/12   Klebsiella  . Ventral hernia   . Walker as ambulation aid    also uses wheelchair at home/when going out    Past Surgical History:  Procedure Laterality Date  . 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 BIOPSY  10/14/2018   Procedure: BIOPSY;  Surgeon: Jerene Bears, MD;  Location: [facility];  Service: Gastroenterology;;  . CARDIAC CATHETERIZATION    . CATARACT EXTRACTION W/ INTRAOCULAR LENS IMPLANT     Hx: of right eye  . CATARACT EXTRACTION W/ INTRAOCULAR LENS IMPLANT Left 2018  . COLONOSCOPY N/A 10/13/2014   Procedure: COLONOSCOPY;  Surgeon: Jerene Bears, MD;  Location: [facility];  Service: Gastroenterology;  Laterality: N/A;  .  COLONOSCOPY W/ BIOPSIES AND POLYPECTOMY     Hx: of  . COLONOSCOPY WITH PROPOFOL N/A 10/14/2018   Procedure: COLONOSCOPY WITH PROPOFOL;  Surgeon: Jerene Bears, MD;  Location: [facility];  Service: Gastroenterology;  Laterality: N/A;  . CORONARY ARTERY BYPASS GRAFT  2000   [name], MD  . [place]  . DEEP BRAIN STIMULATOR PLACEMENT  2004   Right and left VIN stimulator placement (parkinsons)  . EYE SURGERY    . FINGER AMPUTATION     left pointer  . IMPLANTABLE CARDIOVERTER DEFIBRILLATOR IMPLANT N/A 11/13/2013   Procedure: IMPLANTABLE CARDIOVERTER DEFIBRILLATOR IMPLANT;  Surgeon: Evans Lance, MD;  [facility] C[facility] LAB;  Service: Cardiovascular;  Laterality: N/A;  . INSERT / REPLACE / REMOVE PACEMAKER     medtronic  . LEFT AND RIGHT HEART CATHETERIZATION WITH CORONARY ANGIOGRAM N/A 09/24/2013   Procedure: LEFT AND RIGHT HEART CATHETERIZATION WITH CORONARY ANGIOGRAM;  Surgeon: Burnell Blanks, MD;  Location: [facility] CATH LAB;  Service: Cardiovascular;  Laterality: N/A;  . LITHOTRIPSY     3 different times  . MEDIAN STERNOTOMY  2000  . POLYPECTOMY  10/14/2018   Procedure: POLYPECTOMY;  Surgeon: Jerene Bears, MD;  Location: [facility];  Service: Gastroenterology;;  . PULSE GENERATOR IMPLANT Right 11/13/2017   Procedure: Right chest implantable pulse generator change;  Surgeon: Erline Levine, MD;  Location: [place];  Service: Neurosurgery;  Laterality: Right;  Right chest implantable pulse generator change  . SUBTHALAMIC  STIMULATOR BATTERY REPLACEMENT N/A 09/05/2012   Procedure: Deep brain stimulator battery change;  Surgeon: Erline Levine, MD;  Location: [place] [facility];  Service: Neurosurgery;  Laterality: N/A;  Deep brain stimulator battery change  . SUBTHALAMIC STIMULATOR BATTERY REPLACEMENT N/A 06/10/2015   Procedure: Deep Brain stimulator battery change;  Surgeon: Erline Levine, MD;  Location: [place] [facility];  Service: Neurosurgery;  Laterality: N/A;  . SUBTHALAMIC STIMULATOR BATTERY REPLACEMENT Right 09/25/2019   Procedure: Deep brain stimulator battery change;  Surgeon: Erline Levine, MD;  Location: [place];  Service: Neurosurgery;  Laterality: Right;  . TONSILLECTOMY      Social History   Socioeconomic History  . Marital status: Married    Spouse name: [name]  . Number of children: 2  . Years of education: 48  . Highest education level: High school graduate  Occupational History  . 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 Smoking status: Never Smoker  . Smokeless tobacco: Never Used  Vaping Use  . Vaping Use: Never used  Substance and Sexual Activity  . Alcohol use: Not Currently  . Drug use: No  . Sexual activity: Not Currently  Other Topics Concern  . Not on file  Social History Narrative   From [place] [facility]   Retired/disability Clinical research associate   Likes to fish.     Married 1972   3 kids   [place] fan   Social Determinants of Radio broadcast assistant Strain: Not on file  Food Insecurity: Not on file  Transportation Needs: Not on file  Physical Activity: Not on file  Stress: Not on file  Social Connections: Not on file  Intimate Partner Violence: Not on file    Family History  Problem Relation Age of Onset  . Aneurysm Mother   . Alcoholism Father   . HIV/AIDS Brother 18       AIDS  . Healthy Sister   . Healthy Child   . Peripheral vascular  disease Other   . Arthritis Other   . Healthy Sister   . Healthy Child   . Healthy Child   .  Diabetes Neg Hx   . Heart disease Neg Hx   . Colon cancer Neg Hx   . Prostate cancer Neg Hx     ROS: Recent UTI and symptoms with Parkinson's but no fevers or chills, productive cough, hemoptysis, dysphasia, odynophagia, melena, hematochezia, rash, seizure activity, orthopnea, PND, claudication. Remaining systems are negative.  Physical Exam: Well-developed well-nourished in no acute distress.  Skin is warm and dry.  HEENT is normal.  Neck is supple.  Chest is clear to auscultation with normal expansion.  Cardiovascular exam is regular rate and rhythm.  Abdominal exam nontender or distended. No masses palpated. Extremities show trace edema. Neuro - Parkinson's  ECG-normal sinus rhythm at a rate of 73, septal infarct, nonspecific ST changes.  Personally reviewed  A/P  1 coronary artery disease-patient denies chest pain.  Continue aspirin and statin.  2 hypertension-blood pressure controlled.  Continue present medical regimen.  3 hyperlipidemia-continue statin.  4 ischemic cardiomyopathy-continue beta-blocker.  He was having some difficulties with orthostatic hypotension and we have therefore avoided ARB, Entresto or hydralazine/nitrates.  5 chronic combined systolic/diastolic congestive heart failure-he appears to be euvolemic.  Continue diuretic at present dose.  6 paroxysmal atrial fibrillation-continue beta-blocker.  He remains in sinus.  Given his Parkinson's he has frequent falls (at least once monthly) I have therefore felt that the risk of anticoagulation outweighs the benefit.  7 ICD-Per electrophysiology.  Kirk Ruths, MD

## 2020-05-22 ENCOUNTER — Other Ambulatory Visit: Payer: Self-pay | Admitting: Endocrinology

## 2020-05-24 ENCOUNTER — Encounter: Payer: Medicare Other | Admitting: Neurology

## 2020-05-24 ENCOUNTER — Other Ambulatory Visit: Payer: Self-pay

## 2020-05-24 ENCOUNTER — Other Ambulatory Visit: Payer: Self-pay | Admitting: Endocrinology

## 2020-05-24 ENCOUNTER — Encounter: Payer: Self-pay | Admitting: Family Medicine

## 2020-05-24 ENCOUNTER — Ambulatory Visit (INDEPENDENT_AMBULATORY_CARE_PROVIDER_SITE_OTHER): Payer: Medicare Other | Admitting: Family Medicine

## 2020-05-24 VITALS — BP 106/68 | HR 82 | Temp 98.6°F | Resp 20 | Ht 68.0 in | Wt 253.0 lb

## 2020-05-24 DIAGNOSIS — R350 Frequency of micturition: Secondary | ICD-10-CM | POA: Diagnosis not present

## 2020-05-24 DIAGNOSIS — E1165 Type 2 diabetes mellitus with hyperglycemia: Secondary | ICD-10-CM | POA: Diagnosis not present

## 2020-05-24 DIAGNOSIS — R400 Somnolence: Secondary | ICD-10-CM | POA: Diagnosis not present

## 2020-05-24 DIAGNOSIS — N183 Chronic kidney disease, stage 3 unspecified: Secondary | ICD-10-CM | POA: Diagnosis not present

## 2020-05-24 DIAGNOSIS — I255 Ischemic cardiomyopathy: Secondary | ICD-10-CM

## 2020-05-24 LAB — CBC WITH DIFFERENTIAL/PLATELET
Basophils Absolute: 0 10*3/uL (ref 0.0–0.1)
Basophils Relative: 0.3 % (ref 0.0–3.0)
Eosinophils Absolute: 0 10*3/uL (ref 0.0–0.7)
Eosinophils Relative: 0.2 % (ref 0.0–5.0)
HCT: 44 % (ref 39.0–52.0)
Hemoglobin: 14.8 g/dL (ref 13.0–17.0)
Lymphocytes Relative: 5.6 % — ABNORMAL LOW (ref 12.0–46.0)
Lymphs Abs: 0.7 10*3/uL (ref 0.7–4.0)
MCHC: 33.7 g/dL (ref 30.0–36.0)
MCV: 84.2 fl (ref 78.0–100.0)
Monocytes Absolute: 0.6 10*3/uL (ref 0.1–1.0)
Monocytes Relative: 4.7 % (ref 3.0–12.0)
Neutro Abs: 10.8 10*3/uL — ABNORMAL HIGH (ref 1.4–7.7)
Neutrophils Relative %: 89.2 % — ABNORMAL HIGH (ref 43.0–77.0)
Platelets: 121 10*3/uL — ABNORMAL LOW (ref 150.0–400.0)
RBC: 5.23 Mil/uL (ref 4.22–5.81)
RDW: 15.1 % (ref 11.5–15.5)
WBC: 12.2 10*3/uL — ABNORMAL HIGH (ref 4.0–10.5)

## 2020-05-24 LAB — POC URINALSYSI DIPSTICK (AUTOMATED)
Bilirubin, UA: NEGATIVE
Glucose, UA: POSITIVE — AB
Ketones, UA: NEGATIVE
Nitrite, UA: NEGATIVE
Protein, UA: NEGATIVE
Spec Grav, UA: 1.015 (ref 1.010–1.025)
Urobilinogen, UA: 0.2 E.U./dL
pH, UA: 5.5 (ref 5.0–8.0)

## 2020-05-24 LAB — BASIC METABOLIC PANEL
BUN: 26 mg/dL — ABNORMAL HIGH (ref 6–23)
CO2: 29 mEq/L (ref 19–32)
Calcium: 9.2 mg/dL (ref 8.4–10.5)
Chloride: 99 mEq/L (ref 96–112)
Creatinine, Ser: 1.93 mg/dL — ABNORMAL HIGH (ref 0.40–1.50)
GFR: 35.06 mL/min — ABNORMAL LOW (ref >60.00)
Glucose, Bld: 124 mg/dL — ABNORMAL HIGH (ref 70–99)
Potassium: 4.1 mEq/L (ref 3.5–5.1)
Sodium: 138 mEq/L (ref 135–145)

## 2020-05-24 LAB — HEPATIC FUNCTION PANEL
ALT: 4 U/L (ref 0–53)
AST: 6 U/L (ref 0–37)
Albumin: 4.3 g/dL (ref 3.5–5.2)
Alkaline Phosphatase: 72 U/L (ref 39–117)
Bilirubin, Direct: 0.3 mg/dL (ref 0.0–0.3)
Total Bilirubin: 1.1 mg/dL (ref 0.2–1.2)
Total Protein: 7.1 g/dL (ref 6.0–8.3)

## 2020-05-24 MED ORDER — CIPROFLOXACIN HCL 500 MG PO TABS
500.0000 mg | ORAL_TABLET | Freq: Two times a day (BID) | ORAL | 0 refills | Status: DC
Start: 2020-05-24 — End: 2020-06-03

## 2020-05-24 NOTE — Progress Notes (Signed)
Henry Cavanah T. Ilyaas Musto, MD, Henry [name] at [facility] [place], [address]  Phone: [phone]  FAX: [phone]  Henry [name] - 70 y.o. male  MRN [id number]  Date of Birth: 11/11/1950  Date: 05/24/2020  PCP: Tonia Ghent, MD  Referral: Tonia Ghent, MD  Chief Complaint  Patient presents with  . Urinary Frequency    Patient uses the bathroom too much according to his wife its every 30 min.     This visit occurred during the SARS-CoV-2 public health emergency.  Safety protocols were in place, including screening questions prior to the visit, additional usage of staff PPE, and extensive cleaning of exam room while observing appropriate contact time as indicated for disinfecting solutions.   Subjective:   Henry [name] is a 70 y.o. very pleasant male patient with Body mass index is 38.47 kg/m. who presents with the following:  Using the bathroom all of the time.  He has been seen by Urology a number of times.  He also has Parkinson's disease, insulin-dependent diabetes, congestive heart failure, chronic kidney disease, coronary disease, status post CABG.  Generally speaking, his urine is much more frequent than at baseline.  He does take Myrbetriq at baseline, and he is seeing urology a number of times in the past.  He and his wife think that he has been doing worse acutely on Saturday.  She thinks that he is a little bit sleepier than normal, and he has been having to wear depends, and he normally does not do so  Check CBC He is on an insulin pump -they checked his blood sugar quite frequently, this morning it was 120.  It is generally been normal here.  Furosemide use?  Yes, but no changes recently.  Cannot control his urine.  Changed on Saturday - uncontrolled.  He has also had some difficulty with transfers.  Hurts when he urinates - ? Red, wife says no.   Few  days a lot worse.   Last PSA by Urology was 0.9  Cc: Dr. Rica Records from [place] is noted in the HPI, as appropriate  The PMH, PSH, Social History, Family History, Medications, and allergies have been reviewed in [facility], and have been updated if relevant.  Objective:   BP 106/68   Pulse 82   Temp 98.6 F (37 C) (Temporal)   Resp 20   Ht $R'5\' 8"'De$  (1.727 m)   Wt 253 lb (114.8 kg)   BMI 38.47 kg/m   GEN: No acute distress; alert,appropriate.  He appears mildly unwell. PULM: Breathing comfortably in no respiratory distress PSYCH: Normally interactive.  CV: RRR, no m/g/r  ABD: S, NT, ND, + BS, No rebound, No HSM   Laboratory and Imaging Data: Results for orders placed or performed in visit on 05/24/20  POCT Urinalysis Dipstick (Automated)  Result Value Ref Range   Color, UA Yellow    Clarity, UA Clear    Glucose, UA Positive (A) Negative   Bilirubin, UA Negative    Ketones, UA Negative    Spec Grav, UA 1.015 1.010 - 1.025   Blood, UA +-    pH, UA 5.5 5.0 - 8.0   Protein, UA Negative Negative   Urobilinogen, UA 0.2 0.2 or 1.0 E.U./dL   Nitrite, UA Negative    Leukocytes, UA Small (1+) (A) Negative   *Note: Due to a large number of results  and/or encounters for the requested time period, some results have not been displayed. A complete set of results can be found in Results Review.     Assessment and Plan:     ICD-10-CM   1. Urine frequency  R35.0 POCT Urinalysis Dipstick (Automated)    Hepatic function panel    CBC with Differential/Platelet    Basic metabolic panel    Urine Culture  2. Elevated PSA  R97.20   3. Stage 3 chronic kidney disease, unspecified whether stage 3a or 3b CKD (HCC)  N18.30   4. Uncontrolled type 2 diabetes mellitus with hyperglycemia (HCC)  E11.65   5. Somnolence, daytime  R40.0 Hepatic function panel    CBC with Differential/Platelet    Basic metabolic panel   Increased frequency of unclear source, now every 30 minutes.  He does  have leukocyte esterase and some blood in his urine.  Presumptive UTI, and treat as such.  Globally, he appears weaker than normal.  I talked about potential risk, and if he worsens over the next few days, his wife does know that he needs to get further evaluation.  He if he appears quite worse then evaluation in the emergency room is prudent.  Think that we need to assess his renal function as well as a CBC today.  Obtain a urine culture.  He asked for me to CC: Dr. Carolin Sicks  Meds ordered this encounter  Medications  . ciprofloxacin (CIPRO) 500 MG tablet    Sig: Take 1 tablet (500 mg total) by mouth 2 (two) times daily for 10 days.    Dispense:  20 tablet    Refill:  0   There are no discontinued medications. Orders Placed This Encounter  Procedures  . Urine Culture  . Hepatic function panel  . CBC with Differential/Platelet  . Basic metabolic panel  . POCT Urinalysis Dipstick (Automated)    Follow-up: No follow-ups on file.  Signed,  Maud Deed. Vershawn Westrup, MD   Outpatient Encounter Medications as of 05/24/2020  Medication Sig  . acetaminophen (TYLENOL) 500 MG tablet Take 1,000 mg by mouth at bedtime as needed for mild pain or headache.   . albuterol (VENTOLIN HFA) 108 (90 Base) MCG/ACT inhaler Inhale 2 puffs into the lungs every 6 (six) hours as needed for wheezing or shortness of breath.  . allopurinol (ZYLOPRIM) 100 MG tablet TAKE 1 & 1/2 TABLET BY MOUTH DAILY  . aspirin EC 81 MG tablet Take 1 tablet (81 mg total) by mouth daily. Swallow whole.  . carbidopa-levodopa (SINEMET CR) 50-200 MG tablet TAKE 1 TABLET BY MOUTH EVERYDAY AT BEDTIME  . Carbidopa-Levodopa ER (SINEMET CR) 25-100 MG tablet controlled release TAKE 2 TABLETS AT 7AM,THEN 2TABLETS AT 11AM,THEN 2 TABS AT 3PM AND 1TAB AT 7PM  . Cholecalciferol (VITAMIN D) 50 MCG (2000 UT) tablet Take 2,000 Units by mouth daily.   . ciprofloxacin (CIPRO) 500 MG tablet Take 1 tablet (500 mg total) by mouth 2 (two) times daily for  10 days.  . colchicine 0.6 MG tablet Take 1 tablet (0.6 mg total) by mouth daily as needed (gout).  . dorzolamide-timolol (COSOPT) 22.3-6.8 MG/ML ophthalmic solution Place 1 drop into the right eye 2 (two) times daily.  . fluticasone-salmeterol (ADVAIR DISKUS) 250-50 MCG/ACT AEPB Inhale 1 puff into the lungs in the morning and at bedtime. Rinse after use.  Marland Kitchen FREESTYLE LITE test strip USE TO CHECK BLOOD SUGAR 4 TIMES DAILY.  . furosemide (LASIX) 80 MG tablet Take 1 tablet (80 mg  total) by mouth daily.  Marland Kitchen gabapentin (NEURONTIN) 100 MG capsule TAKE 2 CAPSULES BY MOUTH EVERY MORNING AND TAKE 4 CAPSULES BY MOUTH EVERY EVENING  . HUMALOG 100 UNIT/ML injection USE MAXIMUM 76 UNITS PER   DAY WITH V-GO PUMP  . Insulin Disposable Pump (OMNIPOD DASH SYSTEM) KIT 1 each by Does not apply route. Use Dash system for Continuous Blood Glucose Monitoring.  Marland Kitchen KLOR-CON M20 20 MEQ tablet TAKE 3 TABLETS (60 MEQ TOTAL) BY MOUTH DAILY.  Marland Kitchen levothyroxine (SYNTHROID) 137 MCG tablet Take 1 tablet (137 mcg total) by mouth daily before breakfast. Takes additional half tablet once a week  . metoprolol succinate (TOPROL-XL) 50 MG 24 hr tablet Take 1 tablet (50 mg total) by mouth daily. TAKE WITH OR IMMEDIATELY FOLLOWING A MEAL.  Marland Kitchen MYRBETRIQ 25 MG TB24 tablet TAKE 1 TABLET BY MOUTH EVERY DAY  . omega-3 acid ethyl esters (LOVAZA) 1 g capsule Take 2 capsules (2 g total) by mouth 2 (two) times daily.  Marland Kitchen OVER THE COUNTER MEDICATION Apply 1 application topically daily as needed (pain). Theraworx Pain Cream  . OZEMPIC, 1 MG/DOSE, 4 MG/3ML SOPN INJECT $RemoveBef'1MG'[id number]$  SUBCUTANEOUSLY  ONCE A WEEK.  . polyethylene glycol (MIRALAX / GLYCOLAX) packet Take 8.5 g by mouth daily.  Marland Kitchen Propylene Glycol (SYSTANE BALANCE OP) Place 1 drop into the left eye daily as needed (dry eyes). Uses Systane gel apply to the eye as needed  . rosuvastatin (CRESTOR) 20 MG tablet TAKE 1 TABLET BY MOUTH EVERYDAY AT BEDTIME  . traMADol (ULTRAM) 50 MG tablet TAKE 1 TABLET BY  MOUTH TWICE A DAY  . vitamin B-12 (CYANOCOBALAMIN) 1000 MCG tablet Take 1,000 mcg by mouth daily.  . [DISCONTINUED] dapagliflozin propanediol (FARXIGA) 5 MG TABS tablet Take 1 tablet (5 mg total) by mouth daily.  . nitroGLYCERIN (NITROSTAT) 0.4 MG SL tablet Place 1 tablet (0.4 mg total) under the tongue every 5 (five) minutes as needed for chest pain.   No facility-administered encounter medications on file as of 05/24/2020.

## 2020-05-26 LAB — URINE CULTURE
MICRO NUMBER:: 11894475
SPECIMEN QUALITY:: ADEQUATE

## 2020-05-26 NOTE — Progress Notes (Signed)
Assessment/Plan:   1.  Parkinsons Disease             -Status post bilateral DBS STN surgery             -Battery changes occurring September 05, 2012; June 10, 2015; November 13, 2017; 09/25/19             -would like patient to not ambulate at all unless directly monitored.  Otherwise, I want him to stay in his wheelchair.             -Take carbidopa/levodopa 25/100 CR, 2 tablets at 7 AM/2 tablets at 11 AM/2 tablets at 3 PM/1 tablet at 7 PM             -Take carbidopa/levodopa 50/200 CR at bedtime   -home PT - balance worsened with recent UTI  -Reviewed with patient and wife how to use patient controller to turn off his device.  He has an appointment with cardiology and will need the device off to adequately assess the EKG. 2. RBD -He has tried clonazepam several times and ultimately ends up stopping it because of side effect. Could try Rozerem in the future if insurance would pay. Decided to hold off for now as the patient states that while dreaming is vivid, it is not particularly bothersome and he is not falling out of the bed.Discussed bedroom safety. 3. Pseudobulbar affect (crying) -Wants no medication for that but wife does admit that this is becoming worse 4. OSAS, noncompliant with CPAP. -Patient aware of risks/morbidity and mortality associated with untreated sleep apnea 5. CHF -ICD placed on 11/15/2013  -DBS will need placed into special mode if pt ever requires cardioversion 6. Facial paresthesias -Has been going on forfew years and became bilateral, not c/w TN. He has had several negative noncontrast CTs of the brain. Kidney function is not good enough for contrasted CT. His DBS device is not MRI compatible. -I am unsure what this is, and offered to try carbamazepine, but he really did not want that. -I have turned off his DBS and he noticed no change in the facial paresthesias.  7.   UTI with encephalopathy  -Patient currently being treated for UTI.  Both patient and wife agree that he is markedly better, but still has some confusion today.  Subjective:   Henry [name] was seen today in follow up for Parkinsons disease.  My previous records were reviewed prior to todays visit as well as outside records available to me.  Patient with wife who supplements history.  He does have some intermittent tremor.  He has trouble with freezing of gait.  States that dx with UTI and "it nearly killed me."  Feeling better now.  Today is the first day he was able to get up and get dressed.  Wife states still having some confusion, but much better than it was.  Only one fall since last visit and "its been a long time."  Current prescribed movement disorder medications: Carbidopa/levodopa 25/100 CR, 2 tablets at 7 AM/11 AM/3 PM/1 tablet at 7 PM Carbidopa/levodopa 50/200 CR at bedtime    ALLERGIES:   Allergies  Allergen Reactions  . Penicillins Anaphylaxis and Rash    Because of a history of documented adverse serious drug reaction;Medi Alert bracelet  is recommended PATIENT HAS HAD A PCN REACTION WITH IMMEDIATE RASH, FACIAL/TONGUE/THROAT SWELLING, SOB, OR LIGHTHEADEDNESS WITH HYPOTENSION:  #  #  YES  #  #  Has patient had a PCN  reaction causing severe rash involving mucus membranes or skin necrosis: unknown Has patient had a PCN reaction that required hospitalization NO Has patient had a PCN reaction occurring within the last 10 years: NO  . Klonopin [Clonazepam] Other (See Comments)    agitation  . Peanut-Containing Drug Products Cough  . Watermelon [Citrullus Vulgaris] Other (See Comments) and Cough    Tickle in throat    CURRENT MEDICATIONS:  Outpatient Encounter Medications as of 05/27/2020  Medication Sig  . acetaminophen (TYLENOL) 500 MG tablet Take 1,000 mg by mouth at bedtime as needed for mild pain or headache.   . albuterol (VENTOLIN HFA) 108 (90 Base) MCG/ACT inhaler  Inhale 2 puffs into the lungs every 6 (six) hours as needed for wheezing or shortness of breath.  . allopurinol (ZYLOPRIM) 100 MG tablet TAKE 1 & 1/2 TABLET BY MOUTH DAILY  . aspirin EC 81 MG tablet Take 1 tablet (81 mg total) by mouth daily. Swallow whole.  . carbidopa-levodopa (SINEMET CR) 50-200 MG tablet TAKE 1 TABLET BY MOUTH EVERYDAY AT BEDTIME  . Carbidopa-Levodopa ER (SINEMET CR) 25-100 MG tablet controlled release TAKE 2 TABLETS AT 7AM,THEN 2TABLETS AT 11AM,THEN 2 TABS AT 3PM AND 1TAB AT 7PM  . Cholecalciferol (VITAMIN D) 50 MCG (2000 UT) tablet Take 2,000 Units by mouth daily.   . ciprofloxacin (CIPRO) 500 MG tablet Take 1 tablet (500 mg total) by mouth 2 (two) times daily for 10 days.  . colchicine 0.6 MG tablet Take 1 tablet (0.6 mg total) by mouth daily as needed (gout).  . dorzolamide-timolol (COSOPT) 22.3-6.8 MG/ML ophthalmic solution Place 1 drop into the right eye 2 (two) times daily.  Marland Kitchen FARXIGA 5 MG TABS tablet TAKE 1 TABLET (5 MG TOTAL) BY MOUTH DAILY.  . fluticasone-salmeterol (ADVAIR DISKUS) 250-50 MCG/ACT AEPB Inhale 1 puff into the lungs in the morning and at bedtime. Rinse after use.  Marland Kitchen FREESTYLE LITE test strip USE TO CHECK BLOOD SUGAR 4 TIMES DAILY.  . furosemide (LASIX) 80 MG tablet Take 1 tablet (80 mg total) by mouth daily.  Marland Kitchen gabapentin (NEURONTIN) 100 MG capsule TAKE 2 CAPSULES BY MOUTH EVERY MORNING AND TAKE 4 CAPSULES BY MOUTH EVERY EVENING  . HUMALOG 100 UNIT/ML injection USE MAXIMUM 76 UNITS PER   DAY WITH V-GO PUMP  . Insulin Disposable Pump (OMNIPOD DASH SYSTEM) KIT 1 each by Does not apply route. Use Dash system for Continuous Blood Glucose Monitoring.  Marland Kitchen KLOR-CON M20 20 MEQ tablet TAKE 3 TABLETS (60 MEQ TOTAL) BY MOUTH DAILY.  Marland Kitchen levothyroxine (SYNTHROID) 137 MCG tablet Take 1 tablet (137 mcg total) by mouth daily before breakfast. Takes additional half tablet once a week  . metoprolol succinate (TOPROL-XL) 50 MG 24 hr tablet Take 1 tablet (50 mg total) by  mouth daily. TAKE WITH OR IMMEDIATELY FOLLOWING A MEAL.  Marland Kitchen MYRBETRIQ 25 MG TB24 tablet TAKE 1 TABLET BY MOUTH EVERY DAY  . omega-3 acid ethyl esters (LOVAZA) 1 g capsule Take 2 capsules (2 g total) by mouth 2 (two) times daily.  Marland Kitchen OVER THE COUNTER MEDICATION Apply 1 application topically daily as needed (pain). Theraworx Pain Cream  . OZEMPIC, 1 MG/DOSE, 4 MG/3ML SOPN INJECT 1MG SUBCUTANEOUSLY  ONCE A WEEK.  . polyethylene glycol (MIRALAX / GLYCOLAX) packet Take 8.5 g by mouth daily.  Marland Kitchen Propylene Glycol (SYSTANE BALANCE OP) Place 1 drop into the left eye daily as needed (dry eyes). Uses Systane gel apply to the eye as needed  . rosuvastatin (CRESTOR) 20  MG tablet TAKE 1 TABLET BY MOUTH EVERYDAY AT BEDTIME  . traMADol (ULTRAM) 50 MG tablet TAKE 1 TABLET BY MOUTH TWICE A DAY  . vitamin B-12 (CYANOCOBALAMIN) 1000 MCG tablet Take 1,000 mcg by mouth daily.  . nitroGLYCERIN (NITROSTAT) 0.4 MG SL tablet Place 1 tablet (0.4 mg total) under the tongue every 5 (five) minutes as needed for chest pain.   No facility-administered encounter medications on file as of 05/27/2020.    Objective:   PHYSICAL EXAMINATION:    VITALS:   Vitals:   05/27/20 1312  BP: 122/66  Pulse: 65  SpO2: 99%  Weight: 253 lb (114.8 kg)  Height: _0  (1.753 m)    GEN:  The patient appears stated age and is in NAD. HEENT:  Normocephalic, atraumatic.  The mucous membranes are moist. The superficial temporal arteries are without ropiness or tenderness.   Neurological examination:  Orientation: The patient is alert and oriented to person, place, and time.  He remembers that this examiner has twins and asks about them, but at other times he seems to go off on tangential speech. Cranial nerves: There is good facial symmetry with facial hypomimia. The speech is fluent and dysarthric (baseline) he is hypophonic.Marland Kitchen Soft palate rises symmetrically and there is no tongue deviation. Hearing is intact to conversational  tone. Sensation: Sensation is intact to light touch throughout Motor: Strength is at least antigravity x4.  Movement examination: Tone: There is normal tone in the upper and lower extremities Abnormal movements: Very rare rest tremor on the left. Coordination:  There is decremation with RAM's, mostly with foot taps on the left.   Gait and Station: Did not ambulate the patient.  He really does not ambulate any longer and is weaker than usual because of current UTI.  I have reviewed and interpreted the following labs independently    Chemistry      Component Value Date/Time   NA 138 05/24/2020 1322   NA 142 12/22/2016 1233   K 4.1 05/24/2020 1322   CL 99 05/24/2020 1322   CO2 29 05/24/2020 1322   BUN 26 (H) 05/24/2020 1322   BUN 18 07/01/2018 0000   CREATININE 1.93 (H) 05/24/2020 1322   CREATININE 2.13 06/25/2017 0000   CREATININE 1.53 (H) 05/22/2016 1106      Component Value Date/Time   CALCIUM 9.2 05/24/2020 1322   ALKPHOS 72 05/24/2020 1322   AST 6 05/24/2020 1322   ALT 4 05/24/2020 1322   BILITOT 1.1 05/24/2020 1322       Lab Results  Component Value Date   WBC 12.2 (H) 05/24/2020   HGB 14.8 05/24/2020   HCT 44.0 05/24/2020   MCV 84.2 05/24/2020   PLT 121.0 (L) 05/24/2020    Lab Results  Component Value Date   TSH 2.13 03/24/2020     Total time spent on today's visit was 30 minutes, including both face-to-face time and nonface-to-face time.  Time included that spent on review of records (prior notes available to me/labs/imaging if pertinent), discussing treatment and goals, answering patient's questions and coordinating care.  This was not inclusive of DBS time.    Cc:  Tonia Ghent, MD

## 2020-05-27 ENCOUNTER — Encounter: Payer: Self-pay | Admitting: Neurology

## 2020-05-27 ENCOUNTER — Other Ambulatory Visit: Payer: Self-pay

## 2020-05-27 ENCOUNTER — Ambulatory Visit (INDEPENDENT_AMBULATORY_CARE_PROVIDER_SITE_OTHER): Payer: Medicare Other | Admitting: Neurology

## 2020-05-27 VITALS — BP 122/66 | HR 65 | Ht 69.0 in | Wt 253.0 lb

## 2020-05-27 DIAGNOSIS — G20 Parkinson's disease: Secondary | ICD-10-CM

## 2020-05-27 DIAGNOSIS — I255 Ischemic cardiomyopathy: Secondary | ICD-10-CM

## 2020-05-27 NOTE — Procedures (Signed)
DBS Programming was performed.    Manufacturer of DBS device: [employer]tronic  Total time spent programming was 10 minutes.  Device was confirmed to be on.  Soft start was confirmed to be on.  Impedences were checked and were within normal limits.  Battery was checked and was 88%.    Final settings were as follows with Group B active:   Active Contact Amplitude  PW (ms) Frequency (hz) Side Effects Battery  Left Brain        09/08/19 1-2+ 4.4V 90 170  2.59  10/08/19             Group A 1-2+ 4.20mA 90 170         Group B 0-1-2-C+ 1.1 80 125     0- (1.1)1-(1.0)2-(0.6)       01/19/20             Group B 0-1-2-C+ 1.1 80 125  4 yrs 9 months  05/27/20 Group B 0-1-2-C+ 1.3 80 125  3 years, 1 month                                  Right Brain        09/08/19 4-7+ 4.5V 90 170    10/08/19             Group A 4-7+ 2.34mA 90 170         Group B 4-7-C+ 2.5 60 125     4-(2.5)7-(0.6)C+       01/19/20             Group B 4-7-C+ 2.8 60 165    05/27/20 group B 4-7-C+ 2.8 60 165

## 2020-05-28 ENCOUNTER — Encounter: Payer: Self-pay | Admitting: Cardiology

## 2020-05-28 ENCOUNTER — Ambulatory Visit (INDEPENDENT_AMBULATORY_CARE_PROVIDER_SITE_OTHER): Payer: Medicare Other | Admitting: Cardiology

## 2020-05-28 VITALS — BP 102/50 | HR 73 | Ht 68.0 in | Wt 252.0 lb

## 2020-05-28 DIAGNOSIS — Z95810 Presence of automatic (implantable) cardiac defibrillator: Secondary | ICD-10-CM | POA: Diagnosis not present

## 2020-05-28 DIAGNOSIS — I2510 Atherosclerotic heart disease of native coronary artery without angina pectoris: Secondary | ICD-10-CM

## 2020-05-28 DIAGNOSIS — I480 Paroxysmal atrial fibrillation: Secondary | ICD-10-CM

## 2020-05-28 DIAGNOSIS — I255 Ischemic cardiomyopathy: Secondary | ICD-10-CM

## 2020-05-28 NOTE — Patient Instructions (Signed)

## 2020-05-31 DIAGNOSIS — I509 Heart failure, unspecified: Secondary | ICD-10-CM | POA: Diagnosis not present

## 2020-05-31 DIAGNOSIS — I2510 Atherosclerotic heart disease of native coronary artery without angina pectoris: Secondary | ICD-10-CM | POA: Diagnosis not present

## 2020-05-31 DIAGNOSIS — Z794 Long term (current) use of insulin: Secondary | ICD-10-CM | POA: Diagnosis not present

## 2020-05-31 DIAGNOSIS — R350 Frequency of micturition: Secondary | ICD-10-CM | POA: Diagnosis not present

## 2020-05-31 DIAGNOSIS — E1122 Type 2 diabetes mellitus with diabetic chronic kidney disease: Secondary | ICD-10-CM | POA: Diagnosis not present

## 2020-05-31 DIAGNOSIS — N390 Urinary tract infection, site not specified: Secondary | ICD-10-CM | POA: Diagnosis not present

## 2020-05-31 DIAGNOSIS — G20 Parkinson's disease: Secondary | ICD-10-CM | POA: Diagnosis not present

## 2020-05-31 DIAGNOSIS — E1165 Type 2 diabetes mellitus with hyperglycemia: Secondary | ICD-10-CM | POA: Diagnosis not present

## 2020-05-31 DIAGNOSIS — N183 Chronic kidney disease, stage 3 unspecified: Secondary | ICD-10-CM | POA: Diagnosis not present

## 2020-05-31 DIAGNOSIS — Z951 Presence of aortocoronary bypass graft: Secondary | ICD-10-CM | POA: Diagnosis not present

## 2020-05-31 DIAGNOSIS — Z7984 Long term (current) use of oral hypoglycemic drugs: Secondary | ICD-10-CM | POA: Diagnosis not present

## 2020-06-03 ENCOUNTER — Other Ambulatory Visit: Payer: Self-pay

## 2020-06-03 ENCOUNTER — Ambulatory Visit (INDEPENDENT_AMBULATORY_CARE_PROVIDER_SITE_OTHER): Payer: Medicare Other | Admitting: Family Medicine

## 2020-06-03 ENCOUNTER — Encounter: Payer: Self-pay | Admitting: Family Medicine

## 2020-06-03 VITALS — BP 110/64 | HR 68 | Temp 97.9°F | Ht 68.0 in | Wt 250.0 lb

## 2020-06-03 DIAGNOSIS — I255 Ischemic cardiomyopathy: Secondary | ICD-10-CM | POA: Diagnosis not present

## 2020-06-03 DIAGNOSIS — R300 Dysuria: Secondary | ICD-10-CM | POA: Diagnosis not present

## 2020-06-03 DIAGNOSIS — R350 Frequency of micturition: Secondary | ICD-10-CM | POA: Diagnosis not present

## 2020-06-03 LAB — POC URINALSYSI DIPSTICK (AUTOMATED)
Bilirubin, UA: NEGATIVE
Blood, UA: POSITIVE
Glucose, UA: POSITIVE — AB
Ketones, UA: NEGATIVE
Leukocytes, UA: NEGATIVE
Nitrite, UA: NEGATIVE
Protein, UA: NEGATIVE
Spec Grav, UA: 1.015 (ref 1.010–1.025)
Urobilinogen, UA: 0.2 E.U./dL
pH, UA: 6 (ref 5.0–8.0)

## 2020-06-03 MED ORDER — DAPAGLIFLOZIN PROPANEDIOL 5 MG PO TABS: ORAL_TABLET | ORAL | Status: DC

## 2020-06-03 MED ORDER — CIPROFLOXACIN HCL 500 MG PO TABS: 500.0000 mg | ORAL_TABLET | Freq: Two times a day (BID) | ORAL | 0 refills | Status: AC

## 2020-06-03 NOTE — Patient Instructions (Signed)
Hold farxiga for now.  If you have more burning with urination or if you feel worse, then start the cipro.  Update me about your situation/sympotms next week, sooner if needed.  Take care.  Glad to see you.

## 2020-06-03 NOTE — Progress Notes (Signed)
This visit occurred during the SARS-CoV-2 public health emergency.  Safety protocols were in place, including screening questions prior to the visit, additional usage of staff PPE, and extensive cleaning of exam room while observing appropriate contact time as indicated for disinfecting solutions.  Urinary sx f/u.  Urinary sx are better.  No fevers, no chills.  Mentation is better.  Minimal burning with urination.  Finished cipro last night. Still on farxiga.  Sugar recently 90-130s.     Meds, vitals, and allergies reviewed.   ROS: Per HPI unless specifically indicated in ROS section   GEN: nad, alert and oriented HEENT: ncat NECK: supple w/o LA CV: rrr.   PULM: ctab, no inc wob ABD: soft, +bs EXT: Trace BLE edema SKIN: Well-perfused  In wheelchair at baseline.

## 2020-06-04 DIAGNOSIS — N183 Chronic kidney disease, stage 3 unspecified: Secondary | ICD-10-CM | POA: Diagnosis not present

## 2020-06-04 DIAGNOSIS — E1165 Type 2 diabetes mellitus with hyperglycemia: Secondary | ICD-10-CM | POA: Diagnosis not present

## 2020-06-04 DIAGNOSIS — G20 Parkinson's disease: Secondary | ICD-10-CM | POA: Diagnosis not present

## 2020-06-04 DIAGNOSIS — I509 Heart failure, unspecified: Secondary | ICD-10-CM | POA: Diagnosis not present

## 2020-06-04 DIAGNOSIS — I2510 Atherosclerotic heart disease of native coronary artery without angina pectoris: Secondary | ICD-10-CM | POA: Diagnosis not present

## 2020-06-04 DIAGNOSIS — E1122 Type 2 diabetes mellitus with diabetic chronic kidney disease: Secondary | ICD-10-CM | POA: Diagnosis not present

## 2020-06-07 NOTE — Assessment & Plan Note (Signed)
Mentation clearly improved.  See notes on urinalysis. Hold farxiga for now.  If more burning with urination or if feeling worse, then restart cipro.  I want him to update me about his situation/sympotms next week, sooner if needed.  He agrees with plan.

## 2020-06-09 ENCOUNTER — Other Ambulatory Visit: Payer: Self-pay | Admitting: Endocrinology

## 2020-06-09 DIAGNOSIS — G20 Parkinson's disease: Secondary | ICD-10-CM | POA: Diagnosis not present

## 2020-06-09 DIAGNOSIS — E1122 Type 2 diabetes mellitus with diabetic chronic kidney disease: Secondary | ICD-10-CM | POA: Diagnosis not present

## 2020-06-09 DIAGNOSIS — N183 Chronic kidney disease, stage 3 unspecified: Secondary | ICD-10-CM | POA: Diagnosis not present

## 2020-06-09 DIAGNOSIS — E1165 Type 2 diabetes mellitus with hyperglycemia: Secondary | ICD-10-CM | POA: Diagnosis not present

## 2020-06-09 DIAGNOSIS — I2510 Atherosclerotic heart disease of native coronary artery without angina pectoris: Secondary | ICD-10-CM | POA: Diagnosis not present

## 2020-06-09 DIAGNOSIS — I509 Heart failure, unspecified: Secondary | ICD-10-CM | POA: Diagnosis not present

## 2020-06-10 ENCOUNTER — Other Ambulatory Visit: Payer: Self-pay | Admitting: Family Medicine

## 2020-06-10 DIAGNOSIS — M109 Gout, unspecified: Secondary | ICD-10-CM

## 2020-06-11 DIAGNOSIS — G20 Parkinson's disease: Secondary | ICD-10-CM | POA: Diagnosis not present

## 2020-06-11 DIAGNOSIS — I509 Heart failure, unspecified: Secondary | ICD-10-CM | POA: Diagnosis not present

## 2020-06-11 DIAGNOSIS — I2510 Atherosclerotic heart disease of native coronary artery without angina pectoris: Secondary | ICD-10-CM | POA: Diagnosis not present

## 2020-06-11 DIAGNOSIS — N183 Chronic kidney disease, stage 3 unspecified: Secondary | ICD-10-CM | POA: Diagnosis not present

## 2020-06-11 DIAGNOSIS — E1165 Type 2 diabetes mellitus with hyperglycemia: Secondary | ICD-10-CM | POA: Diagnosis not present

## 2020-06-11 DIAGNOSIS — E1122 Type 2 diabetes mellitus with diabetic chronic kidney disease: Secondary | ICD-10-CM | POA: Diagnosis not present

## 2020-06-14 DIAGNOSIS — I509 Heart failure, unspecified: Secondary | ICD-10-CM | POA: Diagnosis not present

## 2020-06-14 DIAGNOSIS — E1122 Type 2 diabetes mellitus with diabetic chronic kidney disease: Secondary | ICD-10-CM | POA: Diagnosis not present

## 2020-06-14 DIAGNOSIS — E1165 Type 2 diabetes mellitus with hyperglycemia: Secondary | ICD-10-CM | POA: Diagnosis not present

## 2020-06-14 DIAGNOSIS — G20 Parkinson's disease: Secondary | ICD-10-CM | POA: Diagnosis not present

## 2020-06-14 DIAGNOSIS — N183 Chronic kidney disease, stage 3 unspecified: Secondary | ICD-10-CM | POA: Diagnosis not present

## 2020-06-14 DIAGNOSIS — I2510 Atherosclerotic heart disease of native coronary artery without angina pectoris: Secondary | ICD-10-CM | POA: Diagnosis not present

## 2020-06-17 DIAGNOSIS — I509 Heart failure, unspecified: Secondary | ICD-10-CM | POA: Diagnosis not present

## 2020-06-17 DIAGNOSIS — N183 Chronic kidney disease, stage 3 unspecified: Secondary | ICD-10-CM | POA: Diagnosis not present

## 2020-06-17 DIAGNOSIS — E1165 Type 2 diabetes mellitus with hyperglycemia: Secondary | ICD-10-CM | POA: Diagnosis not present

## 2020-06-17 DIAGNOSIS — E1122 Type 2 diabetes mellitus with diabetic chronic kidney disease: Secondary | ICD-10-CM | POA: Diagnosis not present

## 2020-06-17 DIAGNOSIS — I2510 Atherosclerotic heart disease of native coronary artery without angina pectoris: Secondary | ICD-10-CM | POA: Diagnosis not present

## 2020-06-17 DIAGNOSIS — G20 Parkinson's disease: Secondary | ICD-10-CM | POA: Diagnosis not present

## 2020-06-24 ENCOUNTER — Other Ambulatory Visit: Payer: Self-pay

## 2020-06-24 ENCOUNTER — Other Ambulatory Visit (INDEPENDENT_AMBULATORY_CARE_PROVIDER_SITE_OTHER): Payer: Medicare Other

## 2020-06-24 DIAGNOSIS — E782 Mixed hyperlipidemia: Secondary | ICD-10-CM

## 2020-06-24 DIAGNOSIS — E890 Postprocedural hypothyroidism: Secondary | ICD-10-CM | POA: Diagnosis not present

## 2020-06-24 DIAGNOSIS — I2510 Atherosclerotic heart disease of native coronary artery without angina pectoris: Secondary | ICD-10-CM | POA: Diagnosis not present

## 2020-06-24 DIAGNOSIS — E1165 Type 2 diabetes mellitus with hyperglycemia: Secondary | ICD-10-CM

## 2020-06-24 DIAGNOSIS — E1122 Type 2 diabetes mellitus with diabetic chronic kidney disease: Secondary | ICD-10-CM | POA: Diagnosis not present

## 2020-06-24 DIAGNOSIS — G20 Parkinson's disease: Secondary | ICD-10-CM | POA: Diagnosis not present

## 2020-06-24 DIAGNOSIS — N183 Chronic kidney disease, stage 3 unspecified: Secondary | ICD-10-CM | POA: Diagnosis not present

## 2020-06-24 DIAGNOSIS — I509 Heart failure, unspecified: Secondary | ICD-10-CM | POA: Diagnosis not present

## 2020-06-24 LAB — LIPID PANEL
Cholesterol: 110 mg/dL (ref 0–200)
HDL: 32.3 mg/dL — ABNORMAL LOW (ref >39.00)
LDL Cholesterol: 47 mg/dL (ref 0–99)
NonHDL: 77.49
Total CHOL/HDL Ratio: 3
Triglycerides: 150 mg/dL — ABNORMAL HIGH (ref 0.0–149.0)
VLDL: 30 mg/dL (ref 0.0–40.0)

## 2020-06-24 LAB — TSH: TSH: 3.09 u[IU]/mL (ref 0.35–4.50)

## 2020-06-24 LAB — COMPREHENSIVE METABOLIC PANEL
ALT: 3 U/L (ref 0–53)
AST: 5 U/L (ref 0–37)
Albumin: 4.1 g/dL (ref 3.5–5.2)
Alkaline Phosphatase: 87 U/L (ref 39–117)
BUN: 20 mg/dL (ref 6–23)
CO2: 33 mEq/L — ABNORMAL HIGH (ref 19–32)
Calcium: 8.7 mg/dL (ref 8.4–10.5)
Chloride: 100 mEq/L (ref 96–112)
Creatinine, Ser: 1.52 mg/dL — ABNORMAL HIGH (ref 0.40–1.50)
GFR: 46.66 mL/min — ABNORMAL LOW (ref >60.00)
Glucose, Bld: 136 mg/dL — ABNORMAL HIGH (ref 70–99)
Potassium: 4.2 mEq/L (ref 3.5–5.1)
Sodium: 141 mEq/L (ref 135–145)
Total Bilirubin: 0.6 mg/dL (ref 0.2–1.2)
Total Protein: 6.3 g/dL (ref 6.0–8.3)

## 2020-06-24 LAB — HEMOGLOBIN A1C: Hgb A1c MFr Bld: 6.3 % (ref 4.6–6.5)

## 2020-06-28 ENCOUNTER — Encounter: Payer: Medicare Other | Admitting: Endocrinology

## 2020-06-28 ENCOUNTER — Other Ambulatory Visit: Payer: Self-pay

## 2020-06-28 NOTE — Progress Notes (Signed)
This encounter was created in error - please disregard.

## 2020-06-30 ENCOUNTER — Ambulatory Visit (INDEPENDENT_AMBULATORY_CARE_PROVIDER_SITE_OTHER): Payer: Medicare Other | Admitting: Endocrinology

## 2020-06-30 ENCOUNTER — Other Ambulatory Visit: Payer: Self-pay

## 2020-06-30 ENCOUNTER — Encounter: Payer: Self-pay | Admitting: Endocrinology

## 2020-06-30 VITALS — BP 118/78 | HR 74 | Ht 69.0 in | Wt 254.4 lb

## 2020-06-30 DIAGNOSIS — R350 Frequency of micturition: Secondary | ICD-10-CM | POA: Diagnosis not present

## 2020-06-30 DIAGNOSIS — I509 Heart failure, unspecified: Secondary | ICD-10-CM | POA: Diagnosis not present

## 2020-06-30 DIAGNOSIS — N390 Urinary tract infection, site not specified: Secondary | ICD-10-CM | POA: Diagnosis not present

## 2020-06-30 DIAGNOSIS — N183 Chronic kidney disease, stage 3 unspecified: Secondary | ICD-10-CM | POA: Diagnosis not present

## 2020-06-30 DIAGNOSIS — E890 Postprocedural hypothyroidism: Secondary | ICD-10-CM

## 2020-06-30 DIAGNOSIS — G20 Parkinson's disease: Secondary | ICD-10-CM | POA: Diagnosis not present

## 2020-06-30 DIAGNOSIS — E782 Mixed hyperlipidemia: Secondary | ICD-10-CM | POA: Diagnosis not present

## 2020-06-30 DIAGNOSIS — Z794 Long term (current) use of insulin: Secondary | ICD-10-CM | POA: Diagnosis not present

## 2020-06-30 DIAGNOSIS — E1142 Type 2 diabetes mellitus with diabetic polyneuropathy: Secondary | ICD-10-CM | POA: Diagnosis not present

## 2020-06-30 DIAGNOSIS — I255 Ischemic cardiomyopathy: Secondary | ICD-10-CM

## 2020-06-30 DIAGNOSIS — Z7984 Long term (current) use of oral hypoglycemic drugs: Secondary | ICD-10-CM | POA: Diagnosis not present

## 2020-06-30 DIAGNOSIS — N1832 Chronic kidney disease, stage 3b: Secondary | ICD-10-CM

## 2020-06-30 DIAGNOSIS — E1165 Type 2 diabetes mellitus with hyperglycemia: Secondary | ICD-10-CM

## 2020-06-30 DIAGNOSIS — I2510 Atherosclerotic heart disease of native coronary artery without angina pectoris: Secondary | ICD-10-CM | POA: Diagnosis not present

## 2020-06-30 DIAGNOSIS — Z951 Presence of aortocoronary bypass graft: Secondary | ICD-10-CM | POA: Diagnosis not present

## 2020-06-30 DIAGNOSIS — E1122 Type 2 diabetes mellitus with diabetic chronic kidney disease: Secondary | ICD-10-CM | POA: Diagnosis not present

## 2020-06-30 MED ORDER — FREESTYLE LIBRE 2 READER DEVI: 1.0000 | Freq: Once | 0 refills | Status: AC

## 2020-06-30 MED ORDER — FREESTYLE LIBRE 2 SENSOR MISC: 2.0000 | 3 refills | Status: DC

## 2020-06-30 NOTE — Progress Notes (Signed)
Patient ID: Henry [name], male   DOB: Jan 11, 1950, 70 y.o.   MRN: [id number]           Reason for Appointment: Follow-up for Type 2 Diabetes   History of Present Illness:          Diagnosis: Type 2 diabetes mellitus, date of diagnosis: 2000       Recent history:    INSULIN regimen: Omnipod insulin pump regional with Humalog insulin   Basal rate programs: Midnight = 1.6, 7 AM = 2.1, 11 a.m. = 2.7, total basal 52 units  Boluses 3-5 units for breakfast, 4-6 lunch and 3 -9 units for dinner  Non-insulin hypoglycemic drugs the patient is taking are: Ozempic 1 mg weekly  His A1c has gone up slightly at 6.3 compared to 5.6   Current management, blood sugar patterns and problems identified: He had been on [place] since about 2/22 but this was stopped after an episode of UTI in 5/22 He has not had any recurrent UTIs before Also his renal function appeared to be slightly worse last month His A1c is relatively higher but his blood sugars at home did not show any consistent pattern high readings on his download Today his blood sugar was higher from eating ice cream last night at dinnertime without extra bolus  Currently checking blood sugars mostly in the morning and only sporadically at lunch or dinner Most of his doses are being done without blood sugar entry at lunch and dinner Has not done any correction boluses also fasting blood sugars are under 120 on average No hypoglycemia He did not check to see if he has coverage for Dexcom or freestyle libre and did not contact the supplier name given  Hypoglycemia:   none  He has seen the dietitian in 04/2017  Not able to exercise because of back pain and difficulty walking       Side effects from medications have been: None Compliance with the medical regimen: Fair   Glucose monitoring:  Recently usually 3 times  a day         Glucometer:  Freestyle with the OmniPod PDM       Blood Glucose readings by download of pump      PRE-MEAL Fasting Lunch Dinner Bedtime Overall  Glucose range: 108-164 137 113-143    Mean/median: 118    121   POST-MEAL PC Breakfast PC Lunch PC Dinner  Glucose range: 152    Mean/median:      Previously  PRE-MEAL Fasting Lunch Dinner Bedtime Overall  Glucose range:     70-171  Mean/median:  92  97  100  98   POST-MEAL PC Breakfast PC Lunch PC Dinner  Glucose range:   95, 101  128, 171  Mean/median:          Meals: 9 am , 2-3 pm and 6-7 pm            Dietician visit, most recent: 4/19               Weight history:   Wt Readings from Last 3 Encounters:  06/30/20 254 lb 6.4 oz (115.4 kg)  06/03/20 250 lb (113.4 kg)  05/28/20 252 lb (114.3 kg)    Glycemic control:   Lab Results  Component Value Date   HGBA1C 6.3 06/24/2020   HGBA1C 5.6 03/29/2020   HGBA1C 5.9 (A) 12/19/2019   Lab Results  Component Value Date   MICROALBUR <0.7 12/19/2019   LDLCALC 47 06/24/2020  CREATININE 1.52 (H) 06/24/2020    Lab Results  Component Value Date   FRUCTOSAMINE 359 (H) 07/01/2019   FRUCTOSAMINE 277 11/09/2017   FRUCTOSAMINE 350 (H) 03/29/2017    Past history:   He has been taking Amaryl for several years and probably metformin since onset also At some point he was changed from metformin to [place] and was taking this since at least 2012 A1c had been higher in 2015 Since his A1c had been progressively higher with his regimen of Janumet and Amaryl he was started on Victoza in 01/2014 He was started on Lantus insulin in 5/16 because of persistent hyperglycemia especially fasting; was having readings as high as 293 Because of tendency to high postprandial readings and for control with Lantus he was switched to the V-go pump in  09/2014   Other active problems: See review of systems    Allergies as of 06/30/2020       Reactions   Penicillins Anaphylaxis, Rash   Because of a history of documented adverse serious drug reaction;Medi Alert bracelet  is  recommended PATIENT HAS HAD A PCN REACTION WITH IMMEDIATE RASH, FACIAL/TONGUE/THROAT SWELLING, SOB, OR LIGHTHEADEDNESS WITH HYPOTENSION:  #  #  YES  #  #  Has patient had a PCN reaction causing severe rash involving mucus membranes or skin necrosis: unknown Has patient had a PCN reaction that required hospitalization NO Has patient had a PCN reaction occurring within the last 10 years: NO   Klonopin [clonazepam] Other (See Comments)   agitation   Peanut-containing Drug Products Cough   Watermelon [citrullus Vulgaris] Other (See Comments), Cough   Tickle in throat        Medication List        Accurate as of June 30, 2020 10:16 AM. If you have any questions, ask your nurse or doctor.          acetaminophen 500 MG tablet Commonly known as: TYLENOL Take 1,000 mg by mouth at bedtime as needed for mild pain or headache.   albuterol 108 (90 Base) MCG/ACT inhaler Commonly known as: VENTOLIN HFA Inhale 2 puffs into the lungs every 6 (six) hours as needed for wheezing or shortness of breath.   allopurinol 100 MG tablet Commonly known as: ZYLOPRIM TAKE 1 & 1/2 TABLET BY MOUTH DAILY   aspirin EC 81 MG tablet Take 1 tablet (81 mg total) by mouth daily. Swallow whole.   carbidopa-levodopa 50-200 MG tablet Commonly known as: SINEMET CR TAKE 1 TABLET BY MOUTH EVERYDAY AT BEDTIME   Carbidopa-Levodopa ER 25-100 MG tablet controlled release Commonly known as: SINEMET CR TAKE 2 TABLETS AT 7AM,THEN 2TABLETS AT 11AM,THEN 2 TABS AT 3PM AND 1TAB AT 7PM   colchicine 0.6 MG tablet Take 1 tablet (0.6 mg total) by mouth daily as needed (gout).   dapagliflozin propanediol 5 MG Tabs tablet Commonly known as: Farxiga Held as of 06/03/20   dorzolamide-timolol 22.3-6.8 MG/ML ophthalmic solution Commonly known as: COSOPT Place 1 drop into the right eye 2 (two) times daily.   fluticasone-salmeterol 250-50 MCG/ACT Aepb Commonly known as: Advair Diskus Inhale 1 puff into the lungs in the  morning and at bedtime. Rinse after use.   FREESTYLE LITE test strip Generic drug: glucose blood USE TO CHECK BLOOD SUGAR 4 TIMES DAILY.   furosemide 80 MG tablet Commonly known as: LASIX Take 1 tablet (80 mg total) by mouth daily.   gabapentin 100 MG capsule Commonly known as: NEURONTIN TAKE 2 CAPSULES BY MOUTH EVERY MORNING AND  TAKE 4 CAPSULES BY MOUTH EVERY EVENING   HumaLOG 100 UNIT/ML injection Generic drug: insulin lispro USE MAXIMUM 76 UNITS PER   DAY WITH V-GO PUMP   Klor-Con M20 20 MEQ tablet Generic drug: potassium chloride SA TAKE 3 TABLETS (60 MEQ TOTAL) BY MOUTH DAILY.   levothyroxine 137 MCG tablet Commonly known as: SYNTHROID Take 1 tablet (137 mcg total) by mouth daily before breakfast. Takes additional half tablet once a week   metoprolol succinate 50 MG 24 hr tablet Commonly known as: TOPROL-XL Take 1 tablet (50 mg total) by mouth daily. TAKE WITH OR IMMEDIATELY FOLLOWING A MEAL.   Myrbetriq 25 MG Tb24 tablet Generic drug: mirabegron ER TAKE 1 TABLET BY MOUTH EVERY DAY   nitroGLYCERIN 0.4 MG SL tablet Commonly known as: NITROSTAT Place 1 tablet (0.4 mg total) under the tongue every 5 (five) minutes as needed for chest pain.   omega-3 acid ethyl esters 1 g capsule Commonly known as: LOVAZA TAKE 2 CAPSULES BY MOUTH 2 TIMES DAILY.   Omnipod DASH PDM (Gen 4) Kit 1 each by Does not apply route. Use Dash system for Continuous Blood Glucose Monitoring.   OVER THE COUNTER MEDICATION Apply 1 application topically daily as needed (pain). Theraworx Pain Cream   Ozempic (1 MG/DOSE) 4 MG/3ML Sopn Generic drug: Semaglutide (1 MG/DOSE) INJECT $RemoveBeforeD'1MG'[id number]$  SUBCUTANEOUSLY  ONCE A WEEK.   polyethylene glycol 17 g packet Commonly known as: MIRALAX / GLYCOLAX Take 8.5 g by mouth daily.   rosuvastatin 20 MG tablet Commonly known as: CRESTOR TAKE 1 TABLET BY MOUTH EVERYDAY AT BEDTIME   SYSTANE BALANCE OP Place 1 drop into the left eye daily as needed (dry eyes). Uses  Systane gel apply to the eye as needed   traMADol 50 MG tablet Commonly known as: ULTRAM TAKE 1 TABLET BY MOUTH TWICE A DAY   vitamin B-12 1000 MCG tablet Commonly known as: CYANOCOBALAMIN Take 1,000 mcg by mouth daily.   Vitamin D 50 MCG (2000 UT) tablet Take 2,000 Units by mouth daily.        Allergies:  Allergies  Allergen Reactions   Penicillins Anaphylaxis and Rash    Because of a history of documented adverse serious drug reaction;Medi Alert bracelet  is recommended PATIENT HAS HAD A PCN REACTION WITH IMMEDIATE RASH, FACIAL/TONGUE/THROAT SWELLING, SOB, OR LIGHTHEADEDNESS WITH HYPOTENSION:  #  #  YES  #  #  Has patient had a PCN reaction causing severe rash involving mucus membranes or skin necrosis: unknown Has patient had a PCN reaction that required hospitalization NO Has patient had a PCN reaction occurring within the last 10 years: NO   Klonopin [Clonazepam] Other (See Comments)    agitation   Peanut-Containing Drug Products Cough   Watermelon [Citrullus Vulgaris] Other (See Comments) and Cough    Tickle in throat    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)   DM (diabetes mellitus) ([place])    TYPE 2 - insulin pump   Dysrhythmia    a-fib   GERD (gastroesophageal reflux disease)  Glaucoma    right eye   History of kidney stones    multiple   HLD (hyperlipidemia)    HTN (hypertension)    pt denies 08/19/12   Hyperplasia, prostate    Hypothyroidism    MI (myocardial infarction) (HCC)    Dr Jens Som 2000, x3vessels bypass   OSA (obstructive sleep apnea)    AHI-28,on CPAP, noncompliant with CPAP   Parkinson  disease (HCC)    1999   PONV (postoperative nausea and vomiting)    Restless legs    Shortness of breath    Hx: of at all times   UTI (lower urinary tract infection) 09/15/12   Klebsiella   Ventral hernia    Walker as ambulation aid    also uses wheelchair at home/when going out    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   BIOPSY  10/14/2018   Procedure: BIOPSY;  Surgeon: Beverley Fiedler, MD;  Location: [facility];  Service: Gastroenterology;;   CARDIAC CATHETERIZATION     CATARACT EXTRACTION W/ INTRAOCULAR LENS IMPLANT     Hx: of right eye   CATARACT EXTRACTION W/ INTRAOCULAR LENS IMPLANT Left 2018   COLONOSCOPY N/A 10/13/2014   Procedure: COLONOSCOPY;  Surgeon: Beverley Fiedler, MD;  Location: [facility];  Service: Gastroenterology;  Laterality: N/A;   COLONOSCOPY W/ BIOPSIES AND POLYPECTOMY     Hx: of   COLONOSCOPY WITH PROPOFOL N/A 10/14/2018   Procedure: COLONOSCOPY WITH PROPOFOL;  Surgeon: Beverley Fiedler, MD;  Location: [facility];  Service: Gastroenterology;  Laterality: N/A;   CORONARY ARTERY BYPASS GRAFT  2000   Tressie Stalker, MD   CORONARY STENT PLACEMENT  1998   DEEP BRAIN STIMULATOR PLACEMENT  2004   Right and left VIN stimulator placement (parkinsons)   EYE SURGERY     FINGER AMPUTATION     left pointer   IMPLANTABLE CARDIOVERTER DEFIBRILLATOR IMPLANT N/A 11/13/2013   Procedure: IMPLANTABLE CARDIOVERTER DEFIBRILLATOR IMPLANT;  Surgeon: Marinus Maw, MD;  Location[facility] CATH LAB;  Service: Cardiovascular;  Laterality: N/A;   INSERT / REPLACE / REMOVE PACEMAKER     medtronic   LEFT AND RIGHT HEART CATHETERIZATION WITH CORONARY ANGIOGRAM N/A 09/24/2013   Procedure: LEFT AND RIGHT HEART CATHETERIZATION WITH CORONARY ANGIOGRAM;  Surgeon: Kathleene Hazel, MD;  Location: [facility] CATH LAB;  Service: Cardiovascular;  Laterality: N/A;   LITHOTRIPSY     3 different times   MEDIAN STERNOTOMY  2000   POLYPECTOMY   10/14/2018   Procedure: POLYPECTOMY;  Surgeon: Beverley Fiedler, MD;  Location: [facility];  Service: Gastroenterology;;   PULSE GENERATOR IMPLANT Right 11/13/2017   Procedure: Right chest implantable pulse generator change;  Surgeon: Maeola Harman, MD;  Location: [facility] OR;  Service: Neurosurgery;  Laterality: Right;  Right chest implantable pulse generator change   SUBTHALAMIC STIMULATOR BATTERY REPLACEMENT N/A 09/05/2012   Procedure: Deep brain stimulator battery change;  Surgeon: Maeola Harman, MD;  Location: [facility];  Service: Neurosurgery;  Laterality: N/A;  Deep brain stimulator battery change   SUBTHALAMIC STIMULATOR BATTERY REPLACEMENT N/A 06/10/2015   Procedure: Deep Brain stimulator battery change;  Surgeon: Maeola Harman, MD;  Location: [facility];  Service: Neurosurgery;  Laterality: N/A;   SUBTHALAMIC STIMULATOR BATTERY REPLACEMENT Right 09/25/2019   Procedure: Deep brain stimulator battery change;  Surgeon: Maeola Harman, MD;  Location: [facility] OR;  Service: Neurosurgery;  Laterality: Right;   TONSILLECTOMY  Family History  Problem Relation Age of Onset   Aneurysm Mother    Alcoholism Father    HIV/AIDS Brother 86       AIDS   Healthy Sister    Healthy Child    Peripheral vascular disease Other    Arthritis Other    Healthy Sister    Healthy Child    Healthy Child    Diabetes Neg Hx    Heart disease Neg Hx    Colon cancer Neg Hx    Prostate cancer Neg Hx     Social History:  reports that he has never smoked. He has never used smokeless tobacco. He reports previous alcohol use. He reports that he does not use drugs.    Review of Systems     HYPERLIPIDEMIA:  He is on Crestor 20 mg with good control of LDL, has low HDL Triglycerides are now at 150, previously over 200, treated with Lovaza 2 g twice a day HDL has improved      Lab Results  Component Value Date   CHOL 110 06/24/2020   CHOL 130 04/24/2019   CHOL 116 09/18/2018   Lab Results  Component Value Date    HDL 32.30 (L) 06/24/2020   HDL 23.20 (L) 04/24/2019   HDL 25.80 (L) 09/18/2018   Lab Results  Component Value Date   LDLCALC 47 06/24/2020   LDLCALC 118 (H) 09/16/2015   LDLCALC 126 (H) 04/08/2013   Lab Results  Component Value Date   TRIG 150.0 (H) 06/24/2020   TRIG 297.0 (H) 04/24/2019   TRIG 270.0 (H) 09/18/2018   Lab Results  Component Value Date   CHOLHDL 3 06/24/2020   CHOLHDL 6 04/24/2019   CHOLHDL 4 09/18/2018   Lab Results  Component Value Date   LDLDIRECT 51.0 04/24/2019   LDLDIRECT 54.0 09/18/2018   LDLDIRECT 67.0 03/12/2018              Lab Results  Component Value Date   ALT 3 06/24/2020        HYPOTHYROIDISM:   He has had hypothyroidism following radioactive iodine treatment for hyperthyroidism in 2013   Last TSH has been normal with now taking 137 mcg levothyroxine, 7.5 tablets a week   Lab Results  Component Value Date   TSH 3.09 06/24/2020   TSH 2.13 03/24/2020   TSH 4.66 (H) 12/19/2019   FREET4 0.77 04/24/2019   FREET4 0.88 08/16/2018   FREET4 0.75 03/12/2018   Has chronic systolic CHF  Continues to be taking 80 mg Lasix daily without metolazone   Blood pressure history:  BP Readings from Last 3 Encounters:  06/30/20 118/78  06/03/20 110/64  05/28/20 (!) 102/50    CKD: Has variable creatinine levels as follows: Regularly followed by nephrologist, Wilder Glade was stopped when he had UTI in 5/22 No history of microalbuminuria   Lab Results  Component Value Date   CREATININE 1.52 (H) 06/24/2020   CREATININE 1.93 (H) 05/24/2020   CREATININE 1.61 (H) 03/24/2020    Neuropathy: Taking gabapentin 100 mg from his PCP. Has history of numbness in his toes  Diabetic foot exam in 08/2018  Last eye exam 11/20, negative  Physical Examination:  BP 118/78   Pulse 74   Ht $R'5\' 9"'cs$  (1.324 m)   Wt 254 lb 6.4 oz (115.4 kg)   SpO2 99%   BMI 37.57 kg/m   No lower leg edema present  ASSESSMENT/PLAN  Diabetes type 2, insulin-dependent  with obesity  See history of present illness for detailed discussion of his current management, blood sugar patterns and problems identified  His A1c is consistently in the normal range recently and now 6.3  He is using the Omni pod insulin pump with the older PDM, also Ozempic 1 mg weekly Wilder Glade stopped because of UTI but not clear if his blood sugars are significantly higher He is not checking blood sugars enough again Current no patterns of high blood sugars are seen especially with not checking after meals    Recommendations  He is checking blood sugars at least 3 times a day especially at mealtimes and also some after meals Take extra 1-3 units for any high fat meals or desserts Also he will make sure he checks blood sugars more often if he is eating unusual meals or desserts and take extra bolus for correction by entering the blood sugar  No change in settings to be made on his pump as yet Since he has apparently an account with [employer] and he will try to get the CGM supplies from them, prescription has been sent  Follow-up in 3 month  HYPOTHYROIDISM: TSH consistently normal  RENAL dysfunction: Recently stable and better than last month  Lipids: With his better diet and good control his triglycerides are much better and also LDL is well controlled Since he is tolerating 20 mg dose of Crestor we will continue the same   There are no Patient Instructions on file for this visit.       Elayne Snare 06/30/2020, 10:16 AM   Note: This office note was prepared with Dragon voice recognition system technology. Any transcriptional errors that result from this process are unintentional.

## 2020-06-30 NOTE — Patient Instructions (Addendum)
Check on [facility] with [facility].  Joellyn Rued extra bolus for sweets or hi fat and may do extra bolus at nite if needed  Check blood sugars on waking up 7 days a week and at each meal  Also check blood sugars about 2 hours after meals and do this after different meals by rotation  Recommended blood sugar levels on waking up are 90-130 and about 2 hours after meal is 130-160  Please bring your blood sugar monitor to each visit, thank you

## 2020-07-01 DIAGNOSIS — I2510 Atherosclerotic heart disease of native coronary artery without angina pectoris: Secondary | ICD-10-CM | POA: Diagnosis not present

## 2020-07-01 DIAGNOSIS — N183 Chronic kidney disease, stage 3 unspecified: Secondary | ICD-10-CM | POA: Diagnosis not present

## 2020-07-01 DIAGNOSIS — I509 Heart failure, unspecified: Secondary | ICD-10-CM | POA: Diagnosis not present

## 2020-07-01 DIAGNOSIS — G20 Parkinson's disease: Secondary | ICD-10-CM | POA: Diagnosis not present

## 2020-07-01 DIAGNOSIS — E1122 Type 2 diabetes mellitus with diabetic chronic kidney disease: Secondary | ICD-10-CM | POA: Diagnosis not present

## 2020-07-01 DIAGNOSIS — E1165 Type 2 diabetes mellitus with hyperglycemia: Secondary | ICD-10-CM | POA: Diagnosis not present

## 2020-07-09 DIAGNOSIS — I509 Heart failure, unspecified: Secondary | ICD-10-CM | POA: Diagnosis not present

## 2020-07-09 DIAGNOSIS — E1122 Type 2 diabetes mellitus with diabetic chronic kidney disease: Secondary | ICD-10-CM | POA: Diagnosis not present

## 2020-07-09 DIAGNOSIS — G20 Parkinson's disease: Secondary | ICD-10-CM | POA: Diagnosis not present

## 2020-07-09 DIAGNOSIS — E1165 Type 2 diabetes mellitus with hyperglycemia: Secondary | ICD-10-CM | POA: Diagnosis not present

## 2020-07-09 DIAGNOSIS — N183 Chronic kidney disease, stage 3 unspecified: Secondary | ICD-10-CM | POA: Diagnosis not present

## 2020-07-09 DIAGNOSIS — I2510 Atherosclerotic heart disease of native coronary artery without angina pectoris: Secondary | ICD-10-CM | POA: Diagnosis not present

## 2020-07-14 ENCOUNTER — Other Ambulatory Visit: Payer: Self-pay | Admitting: Family Medicine

## 2020-07-15 DIAGNOSIS — I509 Heart failure, unspecified: Secondary | ICD-10-CM | POA: Diagnosis not present

## 2020-07-15 DIAGNOSIS — G20 Parkinson's disease: Secondary | ICD-10-CM | POA: Diagnosis not present

## 2020-07-15 DIAGNOSIS — I2510 Atherosclerotic heart disease of native coronary artery without angina pectoris: Secondary | ICD-10-CM | POA: Diagnosis not present

## 2020-07-15 DIAGNOSIS — N183 Chronic kidney disease, stage 3 unspecified: Secondary | ICD-10-CM | POA: Diagnosis not present

## 2020-07-15 DIAGNOSIS — E1165 Type 2 diabetes mellitus with hyperglycemia: Secondary | ICD-10-CM | POA: Diagnosis not present

## 2020-07-15 DIAGNOSIS — E1122 Type 2 diabetes mellitus with diabetic chronic kidney disease: Secondary | ICD-10-CM | POA: Diagnosis not present

## 2020-07-16 NOTE — Telephone Encounter (Signed)
Refill request for Tramadol 50 mg tablets  LOV - 06/03/20 Next OV - not scheduled Last refill - 04/14/20 #60/2

## 2020-07-19 ENCOUNTER — Other Ambulatory Visit: Payer: Self-pay | Admitting: Neurology

## 2020-07-20 ENCOUNTER — Emergency Department (HOSPITAL_COMMUNITY)
Admission: EM | Admit: 2020-07-20 | Discharge: 2020-07-21 | Disposition: A | Discharge status: Home / Self Care | Payer: Medicare Other | Attending: Emergency Medicine | Admitting: Emergency Medicine

## 2020-07-20 ENCOUNTER — Emergency Department (HOSPITAL_COMMUNITY): Payer: Medicare Other

## 2020-07-20 ENCOUNTER — Other Ambulatory Visit: Payer: Self-pay

## 2020-07-20 DIAGNOSIS — I2510 Atherosclerotic heart disease of native coronary artery without angina pectoris: Secondary | ICD-10-CM | POA: Insufficient documentation

## 2020-07-20 DIAGNOSIS — Z7982 Long term (current) use of aspirin: Secondary | ICD-10-CM | POA: Diagnosis not present

## 2020-07-20 DIAGNOSIS — I130 Hypertensive heart and chronic kidney disease with heart failure and stage 1 through stage 4 chronic kidney disease, or unspecified chronic kidney disease: Secondary | ICD-10-CM | POA: Insufficient documentation

## 2020-07-20 DIAGNOSIS — Z951 Presence of aortocoronary bypass graft: Secondary | ICD-10-CM | POA: Diagnosis not present

## 2020-07-20 DIAGNOSIS — I5043 Acute on chronic combined systolic (congestive) and diastolic (congestive) heart failure: Secondary | ICD-10-CM | POA: Diagnosis not present

## 2020-07-20 DIAGNOSIS — S0992XA Unspecified injury of nose, initial encounter: Secondary | ICD-10-CM | POA: Diagnosis present

## 2020-07-20 DIAGNOSIS — J45909 Unspecified asthma, uncomplicated: Secondary | ICD-10-CM | POA: Diagnosis not present

## 2020-07-20 DIAGNOSIS — N183 Chronic kidney disease, stage 3 unspecified: Secondary | ICD-10-CM | POA: Insufficient documentation

## 2020-07-20 DIAGNOSIS — S0121XA Laceration without foreign body of nose, initial encounter: Secondary | ICD-10-CM | POA: Diagnosis not present

## 2020-07-20 DIAGNOSIS — Z794 Long term (current) use of insulin: Secondary | ICD-10-CM | POA: Insufficient documentation

## 2020-07-20 DIAGNOSIS — Z23 Encounter for immunization: Secondary | ICD-10-CM | POA: Insufficient documentation

## 2020-07-20 DIAGNOSIS — Z91010 Allergy to peanuts: Secondary | ICD-10-CM | POA: Insufficient documentation

## 2020-07-20 DIAGNOSIS — S022XXA Fracture of nasal bones, initial encounter for closed fracture: Secondary | ICD-10-CM | POA: Diagnosis not present

## 2020-07-20 DIAGNOSIS — M25512 Pain in left shoulder: Secondary | ICD-10-CM | POA: Diagnosis not present

## 2020-07-20 DIAGNOSIS — E039 Hypothyroidism, unspecified: Secondary | ICD-10-CM | POA: Insufficient documentation

## 2020-07-20 DIAGNOSIS — Z79899 Other long term (current) drug therapy: Secondary | ICD-10-CM | POA: Diagnosis not present

## 2020-07-20 DIAGNOSIS — W19XXXA Unspecified fall, initial encounter: Secondary | ICD-10-CM

## 2020-07-20 DIAGNOSIS — G20 Parkinson's disease: Secondary | ICD-10-CM | POA: Diagnosis not present

## 2020-07-20 DIAGNOSIS — E119 Type 2 diabetes mellitus without complications: Secondary | ICD-10-CM | POA: Diagnosis not present

## 2020-07-20 DIAGNOSIS — W01198A Fall on same level from slipping, tripping and stumbling with subsequent striking against other object, initial encounter: Secondary | ICD-10-CM | POA: Insufficient documentation

## 2020-07-20 DIAGNOSIS — Y92009 Unspecified place in unspecified non-institutional (private) residence as the place of occurrence of the external cause: Secondary | ICD-10-CM

## 2020-07-20 MED ORDER — TETANUS-DIPHTH-ACELL PERTUSSIS 5-2.5-18.5 LF-MCG/0.5 IM SUSY
0.5000 mL | PREFILLED_SYRINGE | Freq: Once | INTRAMUSCULAR | Status: AC
  Administered 2020-07-21: 0.5 mL via INTRAMUSCULAR
  Filled 2020-07-20: qty 0.5

## 2020-07-20 MED ORDER — HYDROCODONE-ACETAMINOPHEN 5-325 MG PO TABS
2.0000 | ORAL_TABLET | Freq: Once | ORAL | Status: AC
  Administered 2020-07-21: 2 via ORAL
  Filled 2020-07-20: qty 2

## 2020-07-20 NOTE — ED Triage Notes (Signed)
Pt transitioning from walker to chair lost his balance resulting In a fall land face forward on floor. Denies LOC. Presents to ED with injury to nose. Bleeding controlled. Also c/o soreness to left chest, pain worsening with movement

## 2020-07-20 NOTE — ED Provider Notes (Signed)
Emergency Medicine Provider Triage Evaluation Note  Henry [name] , a 70 y.o. male  was evaluated in triage.  Pt complains of fall.  Has hx of Parkinsons.  Fell while transferring.  Hit face and head.  Not anticoagulated.  Also complains of left should pain.  Review of Systems  Positive: Fall, face pain, shoulder pain Negative: Fever, vomiting  Physical Exam  BP 115/77 (BP Location: Left Arm)   Pulse 74   Temp 97.7 F (36.5 C) (Oral)   Resp 18   Ht 5\' 9"  (1.753 m)   Wt 115.2 kg   SpO2 96%   BMI 37.51 kg/m  Gen:   Awake, no distress   Resp:  Normal effort  MSK:   Moves extremities without difficulty  Other:  Laceration to nose  Medical Decision Making  Medically screening exam initiated at 10:05 PM.  Appropriate orders placed.  Henry [name] was informed that the remainder of the evaluation will be completed by another provider, this initial triage assessment does not replace that evaluation, and the importance of remaining in the ED until their evaluation is complete.  Fall Laceration   Montine Circle, PA-C 07/20/20 Rexford, Naomi, DO 07/20/20 2312

## 2020-07-21 ENCOUNTER — Ambulatory Visit (INDEPENDENT_AMBULATORY_CARE_PROVIDER_SITE_OTHER): Payer: Medicare Other

## 2020-07-21 ENCOUNTER — Encounter: Payer: Self-pay | Admitting: Endocrinology

## 2020-07-21 DIAGNOSIS — S0121XA Laceration without foreign body of nose, initial encounter: Secondary | ICD-10-CM | POA: Diagnosis not present

## 2020-07-21 DIAGNOSIS — I255 Ischemic cardiomyopathy: Secondary | ICD-10-CM

## 2020-07-21 NOTE — ED Provider Notes (Signed)
[facility] EMERGENCY DEPARTMENT Provider Note   CSN: [id number] Arrival date & time: 07/20/20  1947     History Chief Complaint  Patient presents with   Henry [name]    Henry [name] is a 70 y.o. male.  The history is provided by the patient and the spouse.  Fall He has history of hypertension, diabetes, hyperlipidemia, coronary artery disease, Parkinson's disease, cardiomyopathy and comes in following a fall at home.  He was using his walker and when he tried to turn around and lost his balance and fell striking his face.  There was no loss of consciousness.  He suffered some lacerations on his nose.  Last tetanus immunization is unknown.  He denies other injury.   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)   DM (diabetes mellitus) ([place])    TYPE 2 - insulin pump   Dysrhythmia    a-fib   GERD (gastroesophageal reflux disease)    Glaucoma    right eye   History of kidney stones    multiple   HLD (hyperlipidemia)    HTN (hypertension)    pt denies 08/19/12   Hyperplasia, prostate    Hypothyroidism    MI (myocardial infarction) ([place])    Dr Crenshaw 2000, x3vessels bypass   OSA (obstructive sleep apnea)    AHI-28,on CPAP, noncompliant with CPAP   Parkinson disease ([place])    1999   PONV (postoperative nausea and vomiting)    Restless legs    Shortness of breath    Hx: of at all times   UTI (lower urinary tract infection) 09/15/12   Klebsiella   Ventral hernia    Walker as ambulation aid    also uses wheelchair at home/when going out    Patient Active Problem List   Diagnosis Date Noted   Asthma    Skin lesion  05/30/2019   Colon cancer screening 08/25/2018   CAD (coronary artery disease) 08/25/2018   Diabetes mellitus type 2, uncontrolled ([place]) 08/15/2018   Shoulder pain 10/07/2017   Thrombocytopenia ([place]) 07/08/2017   Gout, unspecified 06/06/2017   Dysuria 05/25/2017   Microscopic hematuria 05/25/2017   CKD (chronic kidney disease) stage 3, GFR 30-59 ml/min (HCC) 04/12/2017   Neuropathy 02/25/2017   Urinary frequency 09/19/2016   Constipation 11/27/2015   Medicare annual wellness visit, initial 09/23/2015   Advance care planning 09/23/2015   Spinal stenosis of lumbar region 01/26/2015   History of colonic polyps    Restless leg syndrome 08/11/2014   ICD- MDT, implanted 11/13/13 53/29/9242   Chronic systolic CHF (congestive heart failure) ([place]) 11/13/2013   Acute on chronic combined systolic and diastolic congestive heart failure, NYHA class 4 ([place]) 05/12/2013   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   Elevated PSA 12/04/2012   Other malaise and fatigue 12/04/2012   PD (Parkinson's disease) ([place]) 08/19/2012   OSA on CPAP 04/17/2012   Dysautonomia ([place]) 04/17/2012   Obesity, morbid ([place]) 04/17/2012   Hypothyroidism following radioiodine therapy 10/16/2011   DYSPNEA/SHORTNESS OF BREATH 11/09/2009   GERD  05/11/2009   IDDM (insulin dependent diabetes mellitus) ([place]) 04/26/2009   OTHER SPEC FORMS CHRONIC ISCHEMIC HEART DISEASE 04/24/2008   HYPERPLASIA PROSTATE UNS W/O UR OBST & OTH LUTS 09/02/2007   NEPHROLITHIASIS, HX OF 09/02/2007   HERNIA, VENTRAL 12/28/2006   Hyperlipidemia 08/31/2006   HTN (hypertension) 08/31/2006   Hx of CABG x 3 in 2000 08/31/2006    Past Surgical History:  Procedure Laterality Date   [place]   right total loss   BIOPSY  10/14/2018   Procedure: BIOPSY;  Surgeon: Jerene Bears, MD;  Location: [facility];  Service: Gastroenterology;;   CARDIAC CATHETERIZATION     CATARACT EXTRACTION W/ INTRAOCULAR LENS IMPLANT     Hx: of right eye    CATARACT EXTRACTION W/ INTRAOCULAR LENS IMPLANT Left 2018   COLONOSCOPY N/A 10/13/2014   Procedure: COLONOSCOPY;  Surgeon: Jerene Bears, MD;  Location: [facility];  Service: Gastroenterology;  Laterality: N/A;   COLONOSCOPY W/ BIOPSIES AND POLYPECTOMY     Hx: of   COLONOSCOPY WITH PROPOFOL N/A 10/14/2018   Procedure: COLONOSCOPY WITH PROPOFOL;  Surgeon: Jerene Bears, MD;  Location: [facility];  Service: Gastroenterology;  Laterality: N/A;   CORONARY ARTERY BYPASS GRAFT  2000   [name], MD   CORONARY STENT PLACEMENT  1998   DEEP BRAIN STIMULATOR PLACEMENT  2004   Right and left VIN stimulator placement (parkinsons)   EYE SURGERY     FINGER AMPUTATION     left pointer   IMPLANTABLE CARDIOVERTER DEFIBRILLATOR IMPLANT N/A 11/13/2013   Procedure: IMPLANTABLE CARDIOVERTER DEFIBRILLATOR IMPLANT;  Surgeon: Evans Lance, MD;  Location: [facility] C[facility] LAB;  Service: Cardiovascular;  Laterality: N/A;   INSERT / REPLACE / REMOVE PACEMAKER     medtronic   LEFT AND RIGHT HEART CATHETERIZATION WITH CORONARY ANGIOGRAM N/A 09/24/2013   Procedure: LEFT AND RIGHT HEART CATHETERIZATION WITH CORONARY ANGIOGRAM;  Surgeon: Burnell Blanks, MD;  Location: [facility] CATH LAB;  Service: Cardiovascular;  Laterality: N/A;   LITHOTRIPSY     3 different times   MEDIAN STERNOTOMY  2000   POLYPECTOMY  10/14/2018   Procedure: POLYPECTOMY;  Surgeon: Jerene Bears, MD;  Location: [facility];  Service: Gastroenterology;;   PULSE GENERATOR IMPLANT Right 11/13/2017   Procedure: Right chest implantable pulse generator change;  Surgeon: Erline Levine, MD;  Location: [place];  Service: Neurosurgery;  Laterality: Right;  Right chest implantable pulse generator change   SUBTHALAMIC STIMULATOR BATTERY REPLACEMENT N/A 09/05/2012   Procedure: Deep brain stimulator battery change;  Surgeon: Erline Levine, MD;  Location: [place] [facility];  Service: Neurosurgery;  Laterality: N/A;  Deep brain stimulator battery change   SUBTHALAMIC  STIMULATOR BATTERY REPLACEMENT N/A 06/10/2015   Procedure: Deep Brain stimulator battery change;  Surgeon: Erline Levine, MD;  Location: [place] [facility];  Service: Neurosurgery;  Laterality: N/A;   SUBTHALAMIC STIMULATOR BATTERY REPLACEMENT Right 09/25/2019   Procedure: Deep brain stimulator battery change;  Surgeon: Erline Levine, MD;  Location: [place];  Service: Neurosurgery;  Laterality: Right;   TONSILLECTOMY         Family History  Problem Relation Age of Onset   Aneurysm Mother    Alcoholism Father    HIV/AIDS Brother 52       AIDS   Healthy Sister    Healthy Child    Peripheral vascular disease Other    Arthritis Other    Healthy Sister    Healthy Child    Healthy Child  Diabetes Neg Hx    Heart disease Neg Hx    Colon cancer Neg Hx    Prostate cancer Neg Hx     Social History   Tobacco Use   Smoking status: Never   Smokeless tobacco: Never  Vaping Use   Vaping Use: Never used  Substance Use Topics   Alcohol use: Not Currently   Drug use: No    Home Medications Prior to Admission medications   Medication Sig Start Date End Date Taking? Authorizing Provider  acetaminophen (TYLENOL) 500 MG tablet Take 1,000 mg by mouth at bedtime as needed for mild pain or headache.     [provider]  albuterol (VENTOLIN HFA) 108 (90 Base) MCG/ACT inhaler Inhale 2 puffs into the lungs every 6 (six) hours as needed for wheezing or shortness of breath. 05/13/20   Tonia Ghent, MD  allopurinol (ZYLOPRIM) 100 MG tablet TAKE 1 & 1/2 TABLET BY MOUTH DAILY 06/11/20   Tonia Ghent, MD  aspirin EC 81 MG tablet Take 1 tablet (81 mg total) by mouth daily. Swallow whole. 10/30/19   Lelon Perla, MD  carbidopa-levodopa (SINEMET CR) 50-200 MG tablet TAKE 1 TABLET BY MOUTH EVERYDAY AT BEDTIME 07/19/20   Tat, Eustace Quail, DO  Carbidopa-Levodopa ER (SINEMET CR) 25-100 MG tablet controlled release TAKE 2 TABLETS AT 7AM,THEN 2TABLETS AT 11AM,THEN 2 TABS AT 3PM AND 1TAB AT 7PM 05/14/20    Tat, Eustace Quail, DO  Cholecalciferol (VITAMIN D) 50 MCG (2000 UT) tablet Take 2,000 Units by mouth daily.     [provider]  colchicine 0.6 MG tablet Take 1 tablet (0.6 mg total) by mouth daily as needed (gout). 05/13/20   Tonia Ghent, MD  Continuous Blood Gluc Sensor (FREESTYLE LIBRE 2 SENSOR) MISC 2 Devices by Does not apply route every 14 (fourteen) days. 06/30/20   Elayne Snare, MD  dapagliflozin propanediol [place]) 5 MG TABS tablet Held as of 06/03/20 Patient not taking: Reported on 06/30/2020 06/03/20   Tonia Ghent, MD  dorzolamide-timolol (COSOPT) 22.3-6.8 MG/ML ophthalmic solution Place 1 drop into the right eye 2 (two) times daily. 08/29/19   [provider]  fluticasone-salmeterol (ADVAIR DISKUS) 250-50 MCG/ACT AEPB Inhale 1 puff into the lungs in the morning and at bedtime. Rinse after use. 05/13/20   Tonia Ghent, MD  FREESTYLE LITE test strip USE TO CHECK BLOOD SUGAR 4 TIMES DAILY. 04/13/20   Elayne Snare, MD  furosemide (LASIX) 80 MG tablet Take 1 tablet (80 mg total) by mouth daily. 05/13/20   Tonia Ghent, MD  gabapentin (NEURONTIN) 100 MG capsule TAKE 2 CAPSULES BY MOUTH EVERY MORNING AND TAKE 4 CAPSULES BY MOUTH EVERY EVENING 01/14/20   Tonia Ghent, MD  HUMALOG 100 UNIT/ML injection USE MAXIMUM 76 UNITS PER   DAY WITH V-GO PUMP 05/04/20   Elayne Snare, MD  Insulin Disposable Pump (OMNIPOD DASH SYSTEM) KIT 1 each by Does not apply route. Use Dash system for Continuous Blood Glucose Monitoring.    [provider]  KLOR-CON M20 20 MEQ tablet TAKE 3 TABLETS (60 MEQ TOTAL) BY MOUTH DAILY. 01/14/20   Tonia Ghent, MD  levothyroxine (SYNTHROID) 137 MCG tablet Take 1 tablet (137 mcg total) by mouth daily before breakfast. Takes additional half tablet once a week 05/13/20   Tonia Ghent, MD  metoprolol succinate (TOPROL-XL) 50 MG 24 hr tablet Take 1 tablet (50 mg total) by mouth daily. TAKE WITH OR IMMEDIATELY FOLLOWING A MEAL. 04/16/20  Tonia Ghent,  MD  MYRBETRIQ 25 MG TB24 tablet TAKE 1 TABLET BY MOUTH EVERY DAY 06/11/20   Tonia Ghent, MD  nitroGLYCERIN (NITROSTAT) 0.4 MG SL tablet Place 1 tablet (0.4 mg total) under the tongue every 5 (five) minutes as needed for chest pain. 10/30/19 01/28/20  Lelon Perla, MD  omega-3 acid ethyl esters (LOVAZA) 1 g capsule TAKE 2 CAPSULES BY MOUTH 2 TIMES DAILY. 06/09/20   Elayne Snare, MD  OVER THE COUNTER MEDICATION Apply 1 application topically daily as needed (pain). Theraworx Pain Cream    [provider]  OZEMPIC, 1 MG/DOSE, 4 MG/3ML SOPN INJECT 1MG SUBCUTANEOUSLY  ONCE A WEEK. 05/04/20   Elayne Snare, MD  polyethylene glycol (MIRALAX / GLYCOLAX) packet Take 8.5 g by mouth daily.    [provider]  Propylene Glycol (SYSTANE BALANCE OP) Place 1 drop into the left eye daily as needed (dry eyes). Uses Systane gel apply to the eye as needed Patient not taking: Reported on 06/30/2020    [provider]  rosuvastatin (CRESTOR) 20 MG tablet TAKE 1 TABLET BY MOUTH EVERYDAY AT BEDTIME 05/22/20   Elayne Snare, MD  traMADol Veatrice [place]) 50 MG tablet TAKE 1 TABLET BY MOUTH TWICE A DAY 07/18/20   Tonia Ghent, MD  vitamin B-12 (CYANOCOBALAMIN) 1000 MCG tablet Take 1,000 mcg by mouth daily.    [provider]    Allergies    Penicillins, Klonopin [clonazepam], Peanut-containing drug products, and Watermelon [citrullus vulgaris]  Review of Systems   Review of Systems  All other systems reviewed and are negative.  Physical Exam Updated Vital Signs BP 112/68 (BP Location: Left Arm)   Pulse 79   Temp 97.7 F (36.5 C) (Oral)   Resp 17   Ht _0  (1.753 m)   Wt 115.2 kg   SpO2 96%   BMI 37.51 kg/m   Physical Exam Vitals and nursing note reviewed.  70 year old male, resting comfortably and in no acute distress. Vital signs are normal. Oxygen saturation is 96%, which is normal. Head is normocephalic.  Laceration is present over the bridge of the nose but without  swelling or deformity. PERRLA, EOMI. Oropharynx is clear. Neck is nontender and supple without adenopathy or JVD. Back is nontender and there is no CVA tenderness. Lungs are clear without rales, wheezes, or rhonchi. Chest is nontender. Heart has regular rate and rhythm without murmur. Abdomen is soft, flat, nontender without masses or hepatosplenomegaly and peristalsis is normoactive. Extremities have 1+ edema, full range of motion is present. Skin is warm and dry without rash. Neurologic: Mental status is normal, cranial nerves are intact, there are no motor or sensory deficits.  ED Results / Procedures / Treatments    Radiology CT Head Wo Contrast  Result Date: 07/20/2020 CLINICAL DATA:  70 year old male with facial trauma. EXAM: CT HEAD WITHOUT CONTRAST CT MAXILLOFACIAL WITHOUT CONTRAST TECHNIQUE: Multidetector CT imaging of the head and maxillofacial structures were performed using the standard protocol without intravenous contrast. Multiplanar CT image reconstructions of the maxillofacial structures were also generated. COMPARISON:  Head CT dated 07/18/2018. FINDINGS: Evaluation is limited due to streak artifact caused by brain stimulator and support wires. CT HEAD FINDINGS Brain: The ventricles and sulci appropriate size for patient's age. Mild periventricular and deep white matter chronic microvascular ischemic changes noted. Bilateral deep brain stimulators again noted in similar position. There is no acute intracranial hemorrhage. No mass effect or midline shift. No extra-axial fluid collection. Vascular: No  hyperdense vessel or unexpected calcification. Skull: No acute calvarial pathology. Other: None CT MAXILLOFACIAL FINDINGS Osseous: There is a minimally depressed fracture of the tip of the nasal bone with several small displaced fracture fragments. No other acute fracture. No mandibular dislocation. Periapical lucency of the right maxillary second molar tooth. Orbits: The globes and  retro-orbital fat are preserved. Sinuses: The visualized paranasal sinuses and the left mastoid air cells are clear. Status post prior right mastectomy. Left hearing aid. Soft tissues: Mild soft tissue swelling over the nose. No hematoma. IMPRESSION: 1. No acute intracranial pathology. Stable positioning of the deep brain stimulators. 2. Minimally depressed fracture of the tip of the nasal bone. Electronically Signed   By: Anner Crete M.D.   On: 07/20/2020 23:22   DG Shoulder Left  Result Date: 07/20/2020 CLINICAL DATA:  Recent fall with left shoulder pain, initial encounter EXAM: LEFT SHOULDER - 2+ VIEW COMPARISON:  None. FINDINGS: No acute fracture or dislocation is noted. No soft tissue abnormality is seen. Underlying bony thorax is within normal limits. IMPRESSION: No acute abnormality noted. Electronically Signed   By: Inez Catalina M.D.   On: 07/20/2020 22:56   CT Maxillofacial Wo Contrast  Result Date: 07/20/2020 CLINICAL DATA:  70 year old male with facial trauma. EXAM: CT HEAD WITHOUT CONTRAST CT MAXILLOFACIAL WITHOUT CONTRAST TECHNIQUE: Multidetector CT imaging of the head and maxillofacial structures were performed using the standard protocol without intravenous contrast. Multiplanar CT image reconstructions of the maxillofacial structures were also generated. COMPARISON:  Head CT dated 07/18/2018. FINDINGS: Evaluation is limited due to streak artifact caused by brain stimulator and support wires. CT HEAD FINDINGS Brain: The ventricles and sulci appropriate size for patient's age. Mild periventricular and deep white matter chronic microvascular ischemic changes noted. Bilateral deep brain stimulators again noted in similar position. There is no acute intracranial hemorrhage. No mass effect or midline shift. No extra-axial fluid collection. Vascular: No hyperdense vessel or unexpected calcification. Skull: No acute calvarial pathology. Other: None CT MAXILLOFACIAL FINDINGS Osseous: There is a  minimally depressed fracture of the tip of the nasal bone with several small displaced fracture fragments. No other acute fracture. No mandibular dislocation. Periapical lucency of the right maxillary second molar tooth. Orbits: The globes and retro-orbital fat are preserved. Sinuses: The visualized paranasal sinuses and the left mastoid air cells are clear. Status post prior right mastectomy. Left hearing aid. Soft tissues: Mild soft tissue swelling over the nose. No hematoma. IMPRESSION: 1. No acute intracranial pathology. Stable positioning of the deep brain stimulators. 2. Minimally depressed fracture of the tip of the nasal bone. Electronically Signed   By: Anner Crete M.D.   On: 07/20/2020 23:22    Procedures .Marland KitchenLaceration Repair  Date/Time: 07/21/2020 6:10 AM Performed by: Delora Fuel, MD Authorized by: Delora Fuel, MD   Consent:    Consent obtained:  Verbal   Consent given by:  Patient   Risks discussed:  Infection, poor cosmetic result, pain and poor wound healing Universal protocol:    Procedure explained and questions answered to patient or proxy's satisfaction: yes     Relevant documents present and verified: yes     Test results available: yes     Imaging studies available: yes     Required blood products, implants, devices, and special equipment available: yes     Site/side marked: yes     Immediately prior to procedure, a time out was called: yes     Patient identity confirmed:  Verbally with patient and arm  band Anesthesia:    Anesthesia method:  None Laceration details:    Location:  Face   Face location:  Nose   Length (cm):  1.5   Depth (mm):  2 Pre-procedure details:    Preparation:  Patient was prepped and draped in usual sterile fashion and imaging obtained to evaluate for foreign bodies Exploration:    Limited defect created (wound extended): no     Hemostasis achieved with:  Direct pressure   Imaging obtained: x-ray     Imaging outcome: foreign body not  noted     Wound exploration: entire depth of wound visualized     Wound extent: no foreign bodies/material noted     Contaminated: no   Treatment:    Area cleansed with:  Saline   Amount of cleaning:  Standard   Debridement:  None   Undermining:  None   Scar revision: no   Skin repair:    Repair method:  Tissue adhesive Approximation:    Approximation:  Close Repair type:    Repair type:  Simple Post-procedure details:    Dressing:  Open (no dressing)   Procedure completion:  Tolerated well, no immediate complications   Medications Ordered in ED Medications  Tdap (BOOSTRIX) injection 0.5 mL (0.5 mLs Intramuscular Given 07/21/20 0514)  HYDROcodone-acetaminophen (NORCO/VICODIN) 5-325 MG per tablet 2 tablet (2 tablets Oral Given 07/21/20 5621)    ED Course  I have reviewed the triage vital signs and the nursing notes.  Pertinent imaging results that were available during my care of the patient were reviewed by me and considered in my medical decision making (see chart for details).   MDM Rules/Calculators/A&P                         Fall with facial injury.  Tdap booster is given.  He is sent for CT scans of head and maxillofacial bones showing a minimally displaced fracture of the tip of the nasal bone.  Left shoulder x-ray shows no evidence of fracture.  Laceration is closed with tissue adhesive and he is discharged with instructions to follow-up with ENT in 1 week if having any difficulty with the nasal fracture.  Old records are reviewed, and he does have a prior ED visit for a fall.  Final Clinical Impression(s) / ED Diagnoses Final diagnoses:  Fall at home, initial encounter  Laceration of nose, initial encounter    Rx / DC Orders ED Discharge Orders     None        Delora Fuel, MD 30/86/57 [phone]

## 2020-07-22 LAB — CUP PACEART REMOTE DEVICE CHECK
Battery Remaining Longevity: 32 mo
Battery Voltage: 2.96 V
Brady Statistic AP VP Percent: 0 %
Brady Statistic AP VS Percent: 0.77 %
Brady Statistic AS VP Percent: 0.06 %
Brady Statistic AS VS Percent: 99.16 %
Brady Statistic RA Percent Paced: 0.78 %
Brady Statistic RV Percent Paced: 0.07 %
Date Time Interrogation Session: 20220713134226
HighPow Impedance: 66 Ohm
Implantable Lead Implant Date: 20151105
Implantable Lead Implant Date: 20151105
Implantable Lead Location: 753859
Implantable Lead Location: 753860
Implantable Lead Model: 5076
Implantable Pulse Generator Implant Date: 20151105
Lead Channel Impedance Value: 437 Ohm
Lead Channel Impedance Value: 456 Ohm
Lead Channel Impedance Value: 494 Ohm
Lead Channel Pacing Threshold Amplitude: 0.375 V
Lead Channel Pacing Threshold Amplitude: 1.125 V
Lead Channel Pacing Threshold Pulse Width: 0.4 ms
Lead Channel Pacing Threshold Pulse Width: 0.4 ms
Lead Channel Sensing Intrinsic Amplitude: 18 mV
Lead Channel Sensing Intrinsic Amplitude: 18 mV
Lead Channel Sensing Intrinsic Amplitude: 2.125 mV
Lead Channel Sensing Intrinsic Amplitude: 2.125 mV
Lead Channel Setting Pacing Amplitude: 2.25 V
Lead Channel Setting Pacing Amplitude: 2.5 V
Lead Channel Setting Pacing Pulse Width: 0.4 ms
Lead Channel Setting Sensing Sensitivity: 0.3 mV

## 2020-07-27 ENCOUNTER — Encounter: Payer: Self-pay | Admitting: Family Medicine

## 2020-07-27 ENCOUNTER — Ambulatory Visit (INDEPENDENT_AMBULATORY_CARE_PROVIDER_SITE_OTHER): Payer: Medicare Other | Admitting: Family Medicine

## 2020-07-27 ENCOUNTER — Other Ambulatory Visit: Payer: Self-pay

## 2020-07-27 VITALS — BP 110/72 | HR 81 | Temp 98.3°F | Ht 69.0 in | Wt 257.0 lb

## 2020-07-27 DIAGNOSIS — G20 Parkinson's disease: Secondary | ICD-10-CM | POA: Diagnosis not present

## 2020-07-27 DIAGNOSIS — I255 Ischemic cardiomyopathy: Secondary | ICD-10-CM | POA: Diagnosis not present

## 2020-07-27 NOTE — Patient Instructions (Addendum)
Don't change your meds for now.  We'll call about PT.  Take care.  Glad to see you.

## 2020-07-27 NOTE — Progress Notes (Signed)
This visit occurred during the SARS-CoV-2 public health emergency.  Safety protocols were in place, including screening questions prior to the visit, additional usage of staff PPE, and extensive cleaning of exam room while observing appropriate contact time as indicated for disinfecting solutions.  He was at home, in the kitchen.  [place] [address] and hit his face.  He had been doing PT, was able to get out of wheelchair.  Wife heard him fall.  Facial bleeding, to ER.  CT noted, with nasal bone fracture.   Nasal breathing is not different from baseline, he is baseline tighter on R nostil at baseline with L side more open.    Until this fall, his U step was helpful.  He is still using his PT exercises.    Meds, vitals, and allergies reviewed.   ROS: Per HPI unless specifically indicated in ROS section   Nad Ncat except for nasal scab noted Neck supple no LA Rrr Ctab Abd soft, not ttp Ext with trace BLE edema.  Walking with U step, not using wheelchair.  This is an improvement from before.  He has a symmetric gait when using U step.  Able to arise from and sit in a chair on his own.  30 minutes were devoted to patient care in this encounter (this includes time spent reviewing the patient's file/history, interviewing and examining the patient, counseling/reviewing plan with patient).

## 2020-07-28 NOTE — Assessment & Plan Note (Signed)
With fall at home noted.  He is able to get up and walk with his U step now.  This is an improvement.  Refer back for PT.  In terms of the nasal bone fracture, I cannot see how he would need surgical intervention at this point and it likely makes sense to just leave this alone for now since he does not have new obstructive symptoms.  He agrees with plan.  He will update me as needed.

## 2020-08-04 DIAGNOSIS — I2510 Atherosclerotic heart disease of native coronary artery without angina pectoris: Secondary | ICD-10-CM | POA: Diagnosis not present

## 2020-08-04 DIAGNOSIS — I252 Old myocardial infarction: Secondary | ICD-10-CM | POA: Diagnosis not present

## 2020-08-04 DIAGNOSIS — G20 Parkinson's disease: Secondary | ICD-10-CM | POA: Diagnosis not present

## 2020-08-04 DIAGNOSIS — Z794 Long term (current) use of insulin: Secondary | ICD-10-CM | POA: Diagnosis not present

## 2020-08-04 DIAGNOSIS — N183 Chronic kidney disease, stage 3 unspecified: Secondary | ICD-10-CM | POA: Diagnosis not present

## 2020-08-04 DIAGNOSIS — R350 Frequency of micturition: Secondary | ICD-10-CM | POA: Diagnosis not present

## 2020-08-04 DIAGNOSIS — J45909 Unspecified asthma, uncomplicated: Secondary | ICD-10-CM | POA: Diagnosis not present

## 2020-08-04 DIAGNOSIS — E1140 Type 2 diabetes mellitus with diabetic neuropathy, unspecified: Secondary | ICD-10-CM | POA: Diagnosis not present

## 2020-08-04 DIAGNOSIS — I5042 Chronic combined systolic (congestive) and diastolic (congestive) heart failure: Secondary | ICD-10-CM | POA: Diagnosis not present

## 2020-08-04 DIAGNOSIS — Z951 Presence of aortocoronary bypass graft: Secondary | ICD-10-CM | POA: Diagnosis not present

## 2020-08-04 DIAGNOSIS — Z9181 History of falling: Secondary | ICD-10-CM | POA: Diagnosis not present

## 2020-08-04 DIAGNOSIS — E1122 Type 2 diabetes mellitus with diabetic chronic kidney disease: Secondary | ICD-10-CM | POA: Diagnosis not present

## 2020-08-04 DIAGNOSIS — I130 Hypertensive heart and chronic kidney disease with heart failure and stage 1 through stage 4 chronic kidney disease, or unspecified chronic kidney disease: Secondary | ICD-10-CM | POA: Diagnosis not present

## 2020-08-04 DIAGNOSIS — I429 Cardiomyopathy, unspecified: Secondary | ICD-10-CM | POA: Diagnosis not present

## 2020-08-04 DIAGNOSIS — Z95810 Presence of automatic (implantable) cardiac defibrillator: Secondary | ICD-10-CM | POA: Diagnosis not present

## 2020-08-04 DIAGNOSIS — E785 Hyperlipidemia, unspecified: Secondary | ICD-10-CM | POA: Diagnosis not present

## 2020-08-09 DIAGNOSIS — I130 Hypertensive heart and chronic kidney disease with heart failure and stage 1 through stage 4 chronic kidney disease, or unspecified chronic kidney disease: Secondary | ICD-10-CM | POA: Diagnosis not present

## 2020-08-09 DIAGNOSIS — E1140 Type 2 diabetes mellitus with diabetic neuropathy, unspecified: Secondary | ICD-10-CM | POA: Diagnosis not present

## 2020-08-09 DIAGNOSIS — E1122 Type 2 diabetes mellitus with diabetic chronic kidney disease: Secondary | ICD-10-CM | POA: Diagnosis not present

## 2020-08-09 DIAGNOSIS — G20 Parkinson's disease: Secondary | ICD-10-CM | POA: Diagnosis not present

## 2020-08-09 DIAGNOSIS — N183 Chronic kidney disease, stage 3 unspecified: Secondary | ICD-10-CM | POA: Diagnosis not present

## 2020-08-09 DIAGNOSIS — I5042 Chronic combined systolic (congestive) and diastolic (congestive) heart failure: Secondary | ICD-10-CM | POA: Diagnosis not present

## 2020-08-11 DIAGNOSIS — G20 Parkinson's disease: Secondary | ICD-10-CM | POA: Diagnosis not present

## 2020-08-11 DIAGNOSIS — I130 Hypertensive heart and chronic kidney disease with heart failure and stage 1 through stage 4 chronic kidney disease, or unspecified chronic kidney disease: Secondary | ICD-10-CM | POA: Diagnosis not present

## 2020-08-11 DIAGNOSIS — I5042 Chronic combined systolic (congestive) and diastolic (congestive) heart failure: Secondary | ICD-10-CM | POA: Diagnosis not present

## 2020-08-11 DIAGNOSIS — N183 Chronic kidney disease, stage 3 unspecified: Secondary | ICD-10-CM | POA: Diagnosis not present

## 2020-08-11 DIAGNOSIS — E1140 Type 2 diabetes mellitus with diabetic neuropathy, unspecified: Secondary | ICD-10-CM | POA: Diagnosis not present

## 2020-08-11 DIAGNOSIS — E1122 Type 2 diabetes mellitus with diabetic chronic kidney disease: Secondary | ICD-10-CM | POA: Diagnosis not present

## 2020-08-12 NOTE — Progress Notes (Signed)
Remote ICD transmission.   

## 2020-08-16 DIAGNOSIS — N183 Chronic kidney disease, stage 3 unspecified: Secondary | ICD-10-CM | POA: Diagnosis not present

## 2020-08-16 DIAGNOSIS — I5042 Chronic combined systolic (congestive) and diastolic (congestive) heart failure: Secondary | ICD-10-CM | POA: Diagnosis not present

## 2020-08-16 DIAGNOSIS — E1140 Type 2 diabetes mellitus with diabetic neuropathy, unspecified: Secondary | ICD-10-CM | POA: Diagnosis not present

## 2020-08-16 DIAGNOSIS — E1122 Type 2 diabetes mellitus with diabetic chronic kidney disease: Secondary | ICD-10-CM | POA: Diagnosis not present

## 2020-08-16 DIAGNOSIS — G20 Parkinson's disease: Secondary | ICD-10-CM | POA: Diagnosis not present

## 2020-08-16 DIAGNOSIS — I130 Hypertensive heart and chronic kidney disease with heart failure and stage 1 through stage 4 chronic kidney disease, or unspecified chronic kidney disease: Secondary | ICD-10-CM | POA: Diagnosis not present

## 2020-08-18 ENCOUNTER — Other Ambulatory Visit: Payer: Self-pay | Admitting: Family Medicine

## 2020-08-18 DIAGNOSIS — E1140 Type 2 diabetes mellitus with diabetic neuropathy, unspecified: Secondary | ICD-10-CM | POA: Diagnosis not present

## 2020-08-18 DIAGNOSIS — N183 Chronic kidney disease, stage 3 unspecified: Secondary | ICD-10-CM | POA: Diagnosis not present

## 2020-08-18 DIAGNOSIS — I5042 Chronic combined systolic (congestive) and diastolic (congestive) heart failure: Secondary | ICD-10-CM | POA: Diagnosis not present

## 2020-08-18 DIAGNOSIS — G20 Parkinson's disease: Secondary | ICD-10-CM | POA: Diagnosis not present

## 2020-08-18 DIAGNOSIS — E1122 Type 2 diabetes mellitus with diabetic chronic kidney disease: Secondary | ICD-10-CM | POA: Diagnosis not present

## 2020-08-18 DIAGNOSIS — I130 Hypertensive heart and chronic kidney disease with heart failure and stage 1 through stage 4 chronic kidney disease, or unspecified chronic kidney disease: Secondary | ICD-10-CM | POA: Diagnosis not present

## 2020-08-18 DIAGNOSIS — I509 Heart failure, unspecified: Secondary | ICD-10-CM

## 2020-08-20 ENCOUNTER — Other Ambulatory Visit: Payer: Self-pay | Admitting: Family Medicine

## 2020-08-23 DIAGNOSIS — I5042 Chronic combined systolic (congestive) and diastolic (congestive) heart failure: Secondary | ICD-10-CM | POA: Diagnosis not present

## 2020-08-23 DIAGNOSIS — E1122 Type 2 diabetes mellitus with diabetic chronic kidney disease: Secondary | ICD-10-CM | POA: Diagnosis not present

## 2020-08-23 DIAGNOSIS — I130 Hypertensive heart and chronic kidney disease with heart failure and stage 1 through stage 4 chronic kidney disease, or unspecified chronic kidney disease: Secondary | ICD-10-CM | POA: Diagnosis not present

## 2020-08-23 DIAGNOSIS — N183 Chronic kidney disease, stage 3 unspecified: Secondary | ICD-10-CM | POA: Diagnosis not present

## 2020-08-23 DIAGNOSIS — G20 Parkinson's disease: Secondary | ICD-10-CM | POA: Diagnosis not present

## 2020-08-23 DIAGNOSIS — E1140 Type 2 diabetes mellitus with diabetic neuropathy, unspecified: Secondary | ICD-10-CM | POA: Diagnosis not present

## 2020-08-23 NOTE — Telephone Encounter (Signed)
Refill request for Gabapentin 100 mg caps  LOV - 07/27/20 Next OV - not scheduled Last refill - 01/14/20 #540/1

## 2020-08-24 NOTE — Telephone Encounter (Signed)
Sent. Thanks.   

## 2020-08-25 DIAGNOSIS — G20 Parkinson's disease: Secondary | ICD-10-CM | POA: Diagnosis not present

## 2020-08-25 DIAGNOSIS — I130 Hypertensive heart and chronic kidney disease with heart failure and stage 1 through stage 4 chronic kidney disease, or unspecified chronic kidney disease: Secondary | ICD-10-CM | POA: Diagnosis not present

## 2020-08-25 DIAGNOSIS — E1122 Type 2 diabetes mellitus with diabetic chronic kidney disease: Secondary | ICD-10-CM | POA: Diagnosis not present

## 2020-08-25 DIAGNOSIS — N183 Chronic kidney disease, stage 3 unspecified: Secondary | ICD-10-CM | POA: Diagnosis not present

## 2020-08-25 DIAGNOSIS — I5042 Chronic combined systolic (congestive) and diastolic (congestive) heart failure: Secondary | ICD-10-CM | POA: Diagnosis not present

## 2020-08-25 DIAGNOSIS — E1140 Type 2 diabetes mellitus with diabetic neuropathy, unspecified: Secondary | ICD-10-CM | POA: Diagnosis not present

## 2020-08-26 DIAGNOSIS — H40032 Anatomical narrow angle, left eye: Secondary | ICD-10-CM | POA: Diagnosis not present

## 2020-08-26 DIAGNOSIS — H4031X3 Glaucoma secondary to eye trauma, right eye, severe stage: Secondary | ICD-10-CM | POA: Diagnosis not present

## 2020-08-30 DIAGNOSIS — I130 Hypertensive heart and chronic kidney disease with heart failure and stage 1 through stage 4 chronic kidney disease, or unspecified chronic kidney disease: Secondary | ICD-10-CM | POA: Diagnosis not present

## 2020-08-30 DIAGNOSIS — I5042 Chronic combined systolic (congestive) and diastolic (congestive) heart failure: Secondary | ICD-10-CM | POA: Diagnosis not present

## 2020-08-30 DIAGNOSIS — E1140 Type 2 diabetes mellitus with diabetic neuropathy, unspecified: Secondary | ICD-10-CM | POA: Diagnosis not present

## 2020-08-30 DIAGNOSIS — G20 Parkinson's disease: Secondary | ICD-10-CM | POA: Diagnosis not present

## 2020-08-30 DIAGNOSIS — N183 Chronic kidney disease, stage 3 unspecified: Secondary | ICD-10-CM | POA: Diagnosis not present

## 2020-08-30 DIAGNOSIS — E1122 Type 2 diabetes mellitus with diabetic chronic kidney disease: Secondary | ICD-10-CM | POA: Diagnosis not present

## 2020-09-02 DIAGNOSIS — E1140 Type 2 diabetes mellitus with diabetic neuropathy, unspecified: Secondary | ICD-10-CM | POA: Diagnosis not present

## 2020-09-02 DIAGNOSIS — G20 Parkinson's disease: Secondary | ICD-10-CM | POA: Diagnosis not present

## 2020-09-02 DIAGNOSIS — N183 Chronic kidney disease, stage 3 unspecified: Secondary | ICD-10-CM | POA: Diagnosis not present

## 2020-09-02 DIAGNOSIS — E1122 Type 2 diabetes mellitus with diabetic chronic kidney disease: Secondary | ICD-10-CM | POA: Diagnosis not present

## 2020-09-02 DIAGNOSIS — I5042 Chronic combined systolic (congestive) and diastolic (congestive) heart failure: Secondary | ICD-10-CM | POA: Diagnosis not present

## 2020-09-02 DIAGNOSIS — I130 Hypertensive heart and chronic kidney disease with heart failure and stage 1 through stage 4 chronic kidney disease, or unspecified chronic kidney disease: Secondary | ICD-10-CM | POA: Diagnosis not present

## 2020-09-03 ENCOUNTER — Other Ambulatory Visit: Payer: Self-pay | Admitting: Endocrinology

## 2020-09-03 ENCOUNTER — Other Ambulatory Visit: Payer: Self-pay | Admitting: Family Medicine

## 2020-09-03 DIAGNOSIS — I429 Cardiomyopathy, unspecified: Secondary | ICD-10-CM | POA: Diagnosis not present

## 2020-09-03 DIAGNOSIS — N183 Chronic kidney disease, stage 3 unspecified: Secondary | ICD-10-CM | POA: Diagnosis not present

## 2020-09-03 DIAGNOSIS — J45909 Unspecified asthma, uncomplicated: Secondary | ICD-10-CM | POA: Diagnosis not present

## 2020-09-03 DIAGNOSIS — G20 Parkinson's disease: Secondary | ICD-10-CM | POA: Diagnosis not present

## 2020-09-03 DIAGNOSIS — I5042 Chronic combined systolic (congestive) and diastolic (congestive) heart failure: Secondary | ICD-10-CM | POA: Diagnosis not present

## 2020-09-03 DIAGNOSIS — Z95810 Presence of automatic (implantable) cardiac defibrillator: Secondary | ICD-10-CM | POA: Diagnosis not present

## 2020-09-03 DIAGNOSIS — E890 Postprocedural hypothyroidism: Secondary | ICD-10-CM

## 2020-09-03 DIAGNOSIS — I2510 Atherosclerotic heart disease of native coronary artery without angina pectoris: Secondary | ICD-10-CM | POA: Diagnosis not present

## 2020-09-03 DIAGNOSIS — E1140 Type 2 diabetes mellitus with diabetic neuropathy, unspecified: Secondary | ICD-10-CM | POA: Diagnosis not present

## 2020-09-03 DIAGNOSIS — Z9181 History of falling: Secondary | ICD-10-CM | POA: Diagnosis not present

## 2020-09-03 DIAGNOSIS — E785 Hyperlipidemia, unspecified: Secondary | ICD-10-CM | POA: Diagnosis not present

## 2020-09-03 DIAGNOSIS — Z794 Long term (current) use of insulin: Secondary | ICD-10-CM | POA: Diagnosis not present

## 2020-09-03 DIAGNOSIS — I252 Old myocardial infarction: Secondary | ICD-10-CM | POA: Diagnosis not present

## 2020-09-03 DIAGNOSIS — E1122 Type 2 diabetes mellitus with diabetic chronic kidney disease: Secondary | ICD-10-CM | POA: Diagnosis not present

## 2020-09-03 DIAGNOSIS — R350 Frequency of micturition: Secondary | ICD-10-CM | POA: Diagnosis not present

## 2020-09-03 DIAGNOSIS — Z951 Presence of aortocoronary bypass graft: Secondary | ICD-10-CM | POA: Diagnosis not present

## 2020-09-03 DIAGNOSIS — I130 Hypertensive heart and chronic kidney disease with heart failure and stage 1 through stage 4 chronic kidney disease, or unspecified chronic kidney disease: Secondary | ICD-10-CM | POA: Diagnosis not present

## 2020-09-06 ENCOUNTER — Telehealth: Payer: Self-pay | Admitting: Family Medicine

## 2020-09-06 NOTE — Telephone Encounter (Signed)
Mrs. Rielly called in and wanted to know if Dr. Damita Dunnings would write a ltter excusing him for jury duty due to his illness that he has. And Mrs. Wiggs stated that she can pick it up.

## 2020-09-07 NOTE — Telephone Encounter (Signed)
Letter done.  Please pull a hard copy for me to sign.  Thanks.

## 2020-09-07 NOTE — Telephone Encounter (Signed)
Letter done and signed. Called and left VM for wife that letter is ready to be picked up. Letter placed up front to be picked up.

## 2020-09-09 DIAGNOSIS — E1122 Type 2 diabetes mellitus with diabetic chronic kidney disease: Secondary | ICD-10-CM | POA: Diagnosis not present

## 2020-09-09 DIAGNOSIS — G20 Parkinson's disease: Secondary | ICD-10-CM | POA: Diagnosis not present

## 2020-09-09 DIAGNOSIS — I5042 Chronic combined systolic (congestive) and diastolic (congestive) heart failure: Secondary | ICD-10-CM | POA: Diagnosis not present

## 2020-09-09 DIAGNOSIS — N183 Chronic kidney disease, stage 3 unspecified: Secondary | ICD-10-CM | POA: Diagnosis not present

## 2020-09-09 DIAGNOSIS — E1140 Type 2 diabetes mellitus with diabetic neuropathy, unspecified: Secondary | ICD-10-CM | POA: Diagnosis not present

## 2020-09-09 DIAGNOSIS — I130 Hypertensive heart and chronic kidney disease with heart failure and stage 1 through stage 4 chronic kidney disease, or unspecified chronic kidney disease: Secondary | ICD-10-CM | POA: Diagnosis not present

## 2020-09-14 ENCOUNTER — Other Ambulatory Visit: Payer: Self-pay | Admitting: Family Medicine

## 2020-09-14 MED ORDER — FLUTICASONE-SALMETEROL 250-50 MCG/ACT IN AEPB: 1.0000 | INHALATION_SPRAY | Freq: Two times a day (BID) | RESPIRATORY_TRACT | 3 refills | Status: DC

## 2020-09-15 ENCOUNTER — Telehealth: Payer: Self-pay | Admitting: Family Medicine

## 2020-09-15 DIAGNOSIS — I5042 Chronic combined systolic (congestive) and diastolic (congestive) heart failure: Secondary | ICD-10-CM | POA: Diagnosis not present

## 2020-09-15 DIAGNOSIS — N183 Chronic kidney disease, stage 3 unspecified: Secondary | ICD-10-CM | POA: Diagnosis not present

## 2020-09-15 DIAGNOSIS — G20 Parkinson's disease: Secondary | ICD-10-CM | POA: Diagnosis not present

## 2020-09-15 DIAGNOSIS — I130 Hypertensive heart and chronic kidney disease with heart failure and stage 1 through stage 4 chronic kidney disease, or unspecified chronic kidney disease: Secondary | ICD-10-CM | POA: Diagnosis not present

## 2020-09-15 DIAGNOSIS — E1122 Type 2 diabetes mellitus with diabetic chronic kidney disease: Secondary | ICD-10-CM | POA: Diagnosis not present

## 2020-09-15 DIAGNOSIS — E1140 Type 2 diabetes mellitus with diabetic neuropathy, unspecified: Secondary | ICD-10-CM | POA: Diagnosis not present

## 2020-09-15 NOTE — Telephone Encounter (Signed)
Henry [name] called in from [facility] to report that [name] fell last week 9/1 in the bathroom trying to pull his pants and lost his balance and denied any injury

## 2020-09-15 NOTE — Telephone Encounter (Signed)
Mel Almond called in and stated that she wanted to report a fall on Sunday he was trying to get to his recliner in his room and fell between chair and bed and bumped his head on the end table he stated that he was sore all over and his wife and daughter was there and picked him up

## 2020-09-15 NOTE — Telephone Encounter (Signed)
Left message for Henry [name] to call back

## 2020-09-15 NOTE — Telephone Encounter (Signed)
Please check with them and see if they need any verbal order about gait/therapy to try to dec fall risk.  Thanks.

## 2020-09-20 DIAGNOSIS — N1832 Chronic kidney disease, stage 3b: Secondary | ICD-10-CM | POA: Diagnosis not present

## 2020-09-20 DIAGNOSIS — N189 Chronic kidney disease, unspecified: Secondary | ICD-10-CM | POA: Diagnosis not present

## 2020-09-20 NOTE — Telephone Encounter (Signed)
Left message to return call to our office.  

## 2020-09-22 DIAGNOSIS — I130 Hypertensive heart and chronic kidney disease with heart failure and stage 1 through stage 4 chronic kidney disease, or unspecified chronic kidney disease: Secondary | ICD-10-CM | POA: Diagnosis not present

## 2020-09-22 DIAGNOSIS — I5042 Chronic combined systolic (congestive) and diastolic (congestive) heart failure: Secondary | ICD-10-CM | POA: Diagnosis not present

## 2020-09-22 DIAGNOSIS — E1122 Type 2 diabetes mellitus with diabetic chronic kidney disease: Secondary | ICD-10-CM | POA: Diagnosis not present

## 2020-09-22 DIAGNOSIS — G20 Parkinson's disease: Secondary | ICD-10-CM | POA: Diagnosis not present

## 2020-09-22 DIAGNOSIS — N183 Chronic kidney disease, stage 3 unspecified: Secondary | ICD-10-CM | POA: Diagnosis not present

## 2020-09-22 DIAGNOSIS — E1140 Type 2 diabetes mellitus with diabetic neuropathy, unspecified: Secondary | ICD-10-CM | POA: Diagnosis not present

## 2020-09-22 NOTE — Telephone Encounter (Signed)
Tried to call Henry [name] back about verbal orders for gait/therapy; LMTCB. I also called [facility] main number to speak with staff about this. Spoke with Herbalist and was able to give verbal orders to inc therapy to try to dec fall risk.

## 2020-09-24 DIAGNOSIS — I5042 Chronic combined systolic (congestive) and diastolic (congestive) heart failure: Secondary | ICD-10-CM | POA: Diagnosis not present

## 2020-09-24 DIAGNOSIS — E1140 Type 2 diabetes mellitus with diabetic neuropathy, unspecified: Secondary | ICD-10-CM | POA: Diagnosis not present

## 2020-09-24 DIAGNOSIS — N183 Chronic kidney disease, stage 3 unspecified: Secondary | ICD-10-CM | POA: Diagnosis not present

## 2020-09-24 DIAGNOSIS — E1122 Type 2 diabetes mellitus with diabetic chronic kidney disease: Secondary | ICD-10-CM | POA: Diagnosis not present

## 2020-09-24 DIAGNOSIS — I130 Hypertensive heart and chronic kidney disease with heart failure and stage 1 through stage 4 chronic kidney disease, or unspecified chronic kidney disease: Secondary | ICD-10-CM | POA: Diagnosis not present

## 2020-09-24 DIAGNOSIS — G20 Parkinson's disease: Secondary | ICD-10-CM | POA: Diagnosis not present

## 2020-09-28 DIAGNOSIS — N2581 Secondary hyperparathyroidism of renal origin: Secondary | ICD-10-CM | POA: Diagnosis not present

## 2020-09-28 DIAGNOSIS — E1140 Type 2 diabetes mellitus with diabetic neuropathy, unspecified: Secondary | ICD-10-CM | POA: Diagnosis not present

## 2020-09-28 DIAGNOSIS — E1122 Type 2 diabetes mellitus with diabetic chronic kidney disease: Secondary | ICD-10-CM | POA: Diagnosis not present

## 2020-09-28 DIAGNOSIS — E669 Obesity, unspecified: Secondary | ICD-10-CM | POA: Diagnosis not present

## 2020-09-28 DIAGNOSIS — I129 Hypertensive chronic kidney disease with stage 1 through stage 4 chronic kidney disease, or unspecified chronic kidney disease: Secondary | ICD-10-CM | POA: Diagnosis not present

## 2020-09-28 DIAGNOSIS — G20 Parkinson's disease: Secondary | ICD-10-CM | POA: Diagnosis not present

## 2020-09-28 DIAGNOSIS — N183 Chronic kidney disease, stage 3 unspecified: Secondary | ICD-10-CM | POA: Diagnosis not present

## 2020-09-28 DIAGNOSIS — N1832 Chronic kidney disease, stage 3b: Secondary | ICD-10-CM | POA: Diagnosis not present

## 2020-09-28 DIAGNOSIS — D631 Anemia in chronic kidney disease: Secondary | ICD-10-CM | POA: Diagnosis not present

## 2020-09-28 DIAGNOSIS — I130 Hypertensive heart and chronic kidney disease with heart failure and stage 1 through stage 4 chronic kidney disease, or unspecified chronic kidney disease: Secondary | ICD-10-CM | POA: Diagnosis not present

## 2020-09-28 DIAGNOSIS — I509 Heart failure, unspecified: Secondary | ICD-10-CM | POA: Diagnosis not present

## 2020-09-28 DIAGNOSIS — Z23 Encounter for immunization: Secondary | ICD-10-CM | POA: Diagnosis not present

## 2020-09-28 DIAGNOSIS — I5042 Chronic combined systolic (congestive) and diastolic (congestive) heart failure: Secondary | ICD-10-CM | POA: Diagnosis not present

## 2020-09-29 ENCOUNTER — Ambulatory Visit (INDEPENDENT_AMBULATORY_CARE_PROVIDER_SITE_OTHER): Payer: Medicare Other | Admitting: Internal Medicine

## 2020-09-29 ENCOUNTER — Encounter: Payer: Self-pay | Admitting: Internal Medicine

## 2020-09-29 ENCOUNTER — Other Ambulatory Visit: Payer: Self-pay

## 2020-09-29 VITALS — BP 118/72 | HR 75 | Ht 69.0 in | Wt 262.0 lb

## 2020-09-29 DIAGNOSIS — I5022 Chronic systolic (congestive) heart failure: Secondary | ICD-10-CM | POA: Diagnosis not present

## 2020-09-29 DIAGNOSIS — Z95810 Presence of automatic (implantable) cardiac defibrillator: Secondary | ICD-10-CM | POA: Diagnosis not present

## 2020-09-29 DIAGNOSIS — I255 Ischemic cardiomyopathy: Secondary | ICD-10-CM | POA: Diagnosis not present

## 2020-09-29 NOTE — Progress Notes (Signed)
HPI Henry [name] returns today for followup. He is a pleasant 70 yo man with a h/o chronic systolic heart failure, ICM, s/p ICD implantation. In the interim,he has had some progression of his Parkinson's. He admits to being sedentary. He denies chest pain. Allergies  Allergen Reactions   Penicillins Anaphylaxis and Rash    Because of a history of documented adverse serious drug reaction;Medi Alert bracelet  is recommended PATIENT HAS HAD A PCN REACTION WITH IMMEDIATE RASH, FACIAL/TONGUE/THROAT SWELLING, SOB, OR LIGHTHEADEDNESS WITH HYPOTENSION:  #  #  YES  #  #  Has patient had a PCN reaction causing severe rash involving mucus membranes or skin necrosis: unknown Has patient had a PCN reaction that required hospitalization NO Has patient had a PCN reaction occurring within the last 10 years: NO   Klonopin [Clonazepam] Other (See Comments)    agitation   Peanut-Containing Drug Products Cough   Watermelon [Citrullus Vulgaris] Other (See Comments) and Cough    Tickle in throat     Current Outpatient Medications  Medication Sig Dispense Refill   acetaminophen (TYLENOL) 500 MG tablet Take 1,000 mg by mouth at bedtime as needed for mild pain or headache.      albuterol (VENTOLIN HFA) 108 (90 Base) MCG/ACT inhaler Inhale 2 puffs into the lungs every 6 (six) hours as needed for wheezing or shortness of breath. 18 g 5   allopurinol (ZYLOPRIM) 100 MG tablet TAKE 1 & 1/2 TABLET BY MOUTH DAILY 135 tablet 3   aspirin EC 81 MG tablet Take 1 tablet (81 mg total) by mouth daily. Swallow whole. 90 tablet 3   carbidopa-levodopa (SINEMET CR) 50-200 MG tablet TAKE 1 TABLET BY MOUTH EVERYDAY AT BEDTIME 90 tablet 0   Carbidopa-Levodopa ER (SINEMET CR) 25-100 MG tablet controlled release TAKE 2 TABLETS AT 7AM,THEN 2TABLETS AT 11AM,THEN 2 TABS AT 3PM AND 1TAB AT 7PM 210 tablet 0   Cholecalciferol (VITAMIN D) 50 MCG (2000 UT) tablet Take 2,000 Units by mouth daily.      colchicine 0.6 MG tablet Take 1  tablet (0.6 mg total) by mouth daily as needed (gout).     dorzolamide-timolol (COSOPT) 22.3-6.8 MG/ML ophthalmic solution Place 1 drop into the right eye 2 (two) times daily.     fluticasone-salmeterol (ADVAIR DISKUS) 250-50 MCG/ACT AEPB Inhale 1 puff into the lungs in the morning and at bedtime. Rinse after use. 180 each 3   FREESTYLE LITE test strip USE TO CHECK BLOOD SUGAR 4 TIMES DAILY. 150 strip 3   furosemide (LASIX) 80 MG tablet Take 1 tablet (80 mg total) by mouth daily. 90 tablet 1   gabapentin (NEURONTIN) 100 MG capsule TAKE 2 CAPSULES BY MOUTH EVERY MORNING AND TAKE 4 CAPSULES BY MOUTH EVERY EVENING 540 capsule 1   HUMALOG 100 UNIT/ML injection USE MAXIMUM 76 UNITS PER   DAY WITH V-GO PUMP 60 mL 3   Insulin Disposable Pump (OMNIPOD DASH SYSTEM) KIT 1 each by Does not apply route. Use Dash system for Continuous Blood Glucose Monitoring.     levothyroxine (SYNTHROID) 137 MCG tablet TAKE 1 TABLET BY MOUTH DAILY BEFORE BREAKFAST. 90 tablet 1   metoprolol succinate (TOPROL-XL) 50 MG 24 hr tablet Take 1 tablet (50 mg total) by mouth daily. TAKE WITH OR IMMEDIATELY FOLLOWING A MEAL. 90 tablet 1   MYRBETRIQ 25 MG TB24 tablet TAKE 1 TABLET BY MOUTH EVERY DAY 30 tablet 5   omega-3 acid ethyl esters (LOVAZA) 1 g capsule TAKE 2  CAPSULES BY MOUTH 2 TIMES DAILY. 360 capsule 1   OVER THE COUNTER MEDICATION Apply 1 application topically daily as needed (pain). Theraworx Pain Cream     OZEMPIC, 1 MG/DOSE, 4 MG/3ML SOPN INJECT $RemoveBef'1MG'iXpcrevecp$  SUBCUTANEOUSLY  ONCE A WEEK. 9 mL 3   polyethylene glycol (MIRALAX / GLYCOLAX) packet Take 8.5 g by mouth daily.     potassium chloride SA (KLOR-CON) 20 MEQ tablet TAKE 3 TABLETS (60 MEQ TOTAL) BY MOUTH DAILY. 270 tablet 1   Propylene Glycol (SYSTANE BALANCE OP) Place 1 drop into the left eye daily as needed (dry eyes). Uses Systane gel apply to the eye as needed     rosuvastatin (CRESTOR) 20 MG tablet TAKE 1 TABLET BY MOUTH EVERYDAY AT BEDTIME 90 tablet 1   traMADol (ULTRAM)  50 MG tablet TAKE 1 TABLET BY MOUTH TWICE A DAY 60 tablet 2   vitamin B-12 (CYANOCOBALAMIN) 1000 MCG tablet Take 1,000 mcg by mouth daily.     nitroGLYCERIN (NITROSTAT) 0.4 MG SL tablet Place 1 tablet (0.4 mg total) under the tongue every 5 (five) minutes as needed for chest pain. 25 tablet 12   No current facility-administered medications for this visit.     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   Dysrhythmia    a-fib   GERD (gastroesophageal reflux disease)    Glaucoma    right eye   History of kidney stones    multiple   HLD (hyperlipidemia)    HTN (hypertension)    pt denies 08/19/12   Hyperplasia, prostate    Hypothyroidism    MI (myocardial infarction) ([place])    Dr Crenshaw 2000, x3vessels bypass   OSA (obstructive sleep apnea)    AHI-28,on CPAP, noncompliant with CPAP   Parkinson disease ([place])    1999   PONV (postoperative nausea and vomiting)    Restless legs    Shortness of breath    Hx: of at all times   UTI (lower urinary tract infection) 09/15/12   Klebsiella   Ventral hernia    Walker as ambulation aid    also uses wheelchair at home/when going out    ROS:   All systems reviewed and negative except as noted in the HPI.   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   BIOPSY  10/14/2018   Procedure: BIOPSY;  Surgeon: Jerene Bears, MD;  Location: [facility];  Service: Gastroenterology;;   CARDIAC CATHETERIZATION     CATARACT EXTRACTION W/ INTRAOCULAR LENS IMPLANT     Hx: of right eye    CATARACT EXTRACTION W/ INTRAOCULAR LENS IMPLANT Left 2018   COLONOSCOPY N/A 10/13/2014   Procedure: COLONOSCOPY;  Surgeon: Jerene Bears, MD;  Location: [facility];  Service: Gastroenterology;  Laterality: N/A;   COLONOSCOPY W/ BIOPSIES AND POLYPECTOMY     Hx: of   COLONOSCOPY WITH PROPOFOL N/A 10/14/2018   Procedure:  COLONOSCOPY WITH PROPOFOL;  Surgeon: Beverley Fiedler, MD;  Location: [facility];  Service: Gastroenterology;  Laterality: N/A;   CORONARY ARTERY BYPASS GRAFT  2000   Tressie Stalker, MD   CORONARY STENT PLACEMENT  1998   DEEP BRAIN STIMULATOR PLACEMENT  2004   Right and left VIN stimulator placement (parkinsons)   EYE SURGERY     FINGER AMPUTATION     left pointer   IMPLANTABLE CARDIOVERTER DEFIBRILLATOR IMPLANT N/A 11/13/2013   Procedure: IMPLANTABLE CARDIOVERTER DEFIBRILLATOR IMPLANT;  Surgeon: Marinus Maw, MD;  Location: [facility] CATH LAB;  Service: Cardiovascular;  Laterality: N/A;   INSERT / REPLACE / REMOVE PACEMAKER     medtronic   LEFT AND RIGHT HEART CATHETERIZATION WITH CORONARY ANGIOGRAM N/A 09/24/2013   Procedure: LEFT AND RIGHT HEART CATHETERIZATION WITH CORONARY ANGIOGRAM;  Surgeon: Kathleene Hazel, MD;  Location[facility];  Service: Cardiovascular;  Laterality: N/A;   LITHOTRIPSY     3 different times   MEDIAN STERNOTOMY  2000   POLYPECTOMY  10/14/2018   Procedure: POLYPECTOMY;  Surgeon: Beverley Fiedler, MD;  Location: [facility];  Service: Gastroenterology;;   PULSE GENERATOR IMPLANT Right 11/13/2017   Procedure: Right chest implantable pulse generator change;  Surgeon: Maeola Harman, MD;  Location: [facility] OR;  Service: Neurosurgery;  Laterality: Right;  Right chest implantable pulse generator change   SUBTHALAMIC STIMULATOR BATTERY REPLACEMENT N/A 09/05/2012   Procedure: Deep brain stimulator battery change;  Surgeon: Maeola Harman, MD;  Location: [facility]O [facility];  Service: Neurosurgery;  Laterality: N/A;  Deep brain stimulator battery change   SUBTHALAMIC  STIMULATOR BATTERY REPLACEMENT N/A 06/10/2015   Procedure: Deep Brain stimulator battery change;  Surgeon: Maeola Harman, MD;  Location: [facility];  Service: Neurosurgery;  Laterality: N/A;   SUBTHALAMIC STIMULATOR BATTERY REPLACEMENT Right 09/25/2019   Procedure: Deep brain stimulator battery change;  Surgeon: Maeola Harman, MD;  Location: [facility] OR;  Service: Neurosurgery;  Laterality: Right;   TONSILLECTOMY       Family History  Problem Relation Age of Onset   Aneurysm Mother    Alcoholism Father    HIV/AIDS Brother 63       AIDS   Healthy Sister    Healthy Child    Peripheral vascular disease Other    Arthritis Other    Healthy Sister    Healthy Child    Healthy Child    Diabetes Neg Hx    Heart disease Neg Hx    Colon cancer Neg Hx    Prostate cancer Neg Hx      Social History   Socioeconomic History   Marital status: Married    Spouse name: [name]   Number of children: 2   Years of education: 12   Highest education level: High school graduate  Occupational History   Occupation: DISABLED    Comment: [employer], [employer]  Tobacco Use   Smoking status: Never   Smokeless tobacco: Never  Vaping Use   Vaping Use: Never used  Substance and Sexual Activity   Alcohol use: Not Currently   Drug use: No   Sexual activity: Not Currently  Other Topics Concern   Not on file  Social History Narrative   From [place]   Retired/disability Archivist   Likes to fish.     Married 1972   3 kids   [employer] manager   Social Determinants of Corporate investment banker Strain: Not on [employer] Insecurity: Not on file  Transportation Needs: Not on file  Physical Activity: Not on file  Stress: Not on file  Social Connections: Not on file  Intimate Partner Violence: Not on file     BP 118/72   Pulse 75   Ht $R'5\' 9"'OY$  (1.753 m)   Wt 262 lb (118.8 kg)   SpO2 96%   BMI 38.69 kg/m   Physical Exam:  Well appearing NAD with no tremor. HEENT:  Unremarkable Neck:  No JVD, no thyromegally Lymphatics:  No adenopathy Back:  No CVA tenderness Lungs:  Clear with no wheezes HEART:  Regular rate rhythm, no murmurs, no rubs, no clicks Abd:  soft, positive bowel sounds, no organomegally, no rebound, no guarding Ext:  2 plus pulses, no edema, no cyanosis, no clubbing Skin:  No rashes no nodules Neuro:  CN II through XII intact, motor grossly intact   DEVICE  Normal device function.  See PaceArt for details.   Assess/Plan:  1. Chronic systolic heart failure - he has class 2 symptoms but is limited by his parkinsons' 2. ICM - he denies anginal symptoms. No chest pain. 3. ICD - his Medtronic ICD is working normally. 4. Obesity - he is encouraged to lose weight but this is difficult due to his lack of activity.  Carleene Overlie Romelle Reiley,MD

## 2020-09-29 NOTE — Patient Instructions (Signed)
Medication Instructions:  Your physician recommends that you continue on your current medications as directed. Please refer to the Current Medication list given to you today.  *If you need a refill on your cardiac medications before your next appointment, please call your pharmacy*   Lab Work: none If you have labs (blood work) drawn today and your tests are completely normal, you will receive your results only by: [place] (if you have MyChart) OR A paper copy in the mail If you have any lab test that is abnormal or we need to change your treatment, we will call you to review the results.   Testing/Procedures: none   Follow-Up: At [facility], you and your health needs are our priority.  As part of our continuing mission to provide you with exceptional heart care, we have created designated Provider Care Teams.  These Care Teams include your primary Cardiologist (physician) and Advanced Practice Providers (APPs -  Physician Assistants and Nurse Practitioners) who all work together to provide you with the care you need, when you need it.  We recommend signing up for the patient portal called "MyChart".  Sign up information is provided on this After Visit Summary.  MyChart is used to connect with patients for Virtual Visits (Telemedicine).  Patients are able to view lab/test results, encounter notes, upcoming appointments, etc.  Non-urgent messages can be sent to your provider as well.   To learn more about what you can do with MyChart, go to [email or web address].    Your next appointment:   1 year(s)  The format for your next appointment:   In Person  Provider:   Cristopher Peru, MD   Other Instructions

## 2020-09-30 ENCOUNTER — Other Ambulatory Visit: Payer: Medicare Other

## 2020-10-01 DIAGNOSIS — G20 Parkinson's disease: Secondary | ICD-10-CM | POA: Diagnosis not present

## 2020-10-01 DIAGNOSIS — N183 Chronic kidney disease, stage 3 unspecified: Secondary | ICD-10-CM | POA: Diagnosis not present

## 2020-10-01 DIAGNOSIS — I130 Hypertensive heart and chronic kidney disease with heart failure and stage 1 through stage 4 chronic kidney disease, or unspecified chronic kidney disease: Secondary | ICD-10-CM | POA: Diagnosis not present

## 2020-10-01 DIAGNOSIS — E1140 Type 2 diabetes mellitus with diabetic neuropathy, unspecified: Secondary | ICD-10-CM | POA: Diagnosis not present

## 2020-10-01 DIAGNOSIS — I5042 Chronic combined systolic (congestive) and diastolic (congestive) heart failure: Secondary | ICD-10-CM | POA: Diagnosis not present

## 2020-10-01 DIAGNOSIS — E1122 Type 2 diabetes mellitus with diabetic chronic kidney disease: Secondary | ICD-10-CM | POA: Diagnosis not present

## 2020-10-03 DIAGNOSIS — G20 Parkinson's disease: Secondary | ICD-10-CM | POA: Diagnosis not present

## 2020-10-03 DIAGNOSIS — I130 Hypertensive heart and chronic kidney disease with heart failure and stage 1 through stage 4 chronic kidney disease, or unspecified chronic kidney disease: Secondary | ICD-10-CM | POA: Diagnosis not present

## 2020-10-03 DIAGNOSIS — I2510 Atherosclerotic heart disease of native coronary artery without angina pectoris: Secondary | ICD-10-CM | POA: Diagnosis not present

## 2020-10-03 DIAGNOSIS — N183 Chronic kidney disease, stage 3 unspecified: Secondary | ICD-10-CM | POA: Diagnosis not present

## 2020-10-03 DIAGNOSIS — E785 Hyperlipidemia, unspecified: Secondary | ICD-10-CM | POA: Diagnosis not present

## 2020-10-03 DIAGNOSIS — E1122 Type 2 diabetes mellitus with diabetic chronic kidney disease: Secondary | ICD-10-CM | POA: Diagnosis not present

## 2020-10-03 DIAGNOSIS — Z9181 History of falling: Secondary | ICD-10-CM | POA: Diagnosis not present

## 2020-10-03 DIAGNOSIS — I252 Old myocardial infarction: Secondary | ICD-10-CM | POA: Diagnosis not present

## 2020-10-03 DIAGNOSIS — I5042 Chronic combined systolic (congestive) and diastolic (congestive) heart failure: Secondary | ICD-10-CM | POA: Diagnosis not present

## 2020-10-03 DIAGNOSIS — Z95810 Presence of automatic (implantable) cardiac defibrillator: Secondary | ICD-10-CM | POA: Diagnosis not present

## 2020-10-03 DIAGNOSIS — Z951 Presence of aortocoronary bypass graft: Secondary | ICD-10-CM | POA: Diagnosis not present

## 2020-10-03 DIAGNOSIS — Z794 Long term (current) use of insulin: Secondary | ICD-10-CM | POA: Diagnosis not present

## 2020-10-03 DIAGNOSIS — J45909 Unspecified asthma, uncomplicated: Secondary | ICD-10-CM | POA: Diagnosis not present

## 2020-10-03 DIAGNOSIS — I429 Cardiomyopathy, unspecified: Secondary | ICD-10-CM | POA: Diagnosis not present

## 2020-10-03 DIAGNOSIS — E1140 Type 2 diabetes mellitus with diabetic neuropathy, unspecified: Secondary | ICD-10-CM | POA: Diagnosis not present

## 2020-10-03 DIAGNOSIS — R350 Frequency of micturition: Secondary | ICD-10-CM | POA: Diagnosis not present

## 2020-10-04 ENCOUNTER — Other Ambulatory Visit (INDEPENDENT_AMBULATORY_CARE_PROVIDER_SITE_OTHER): Payer: Medicare Other

## 2020-10-04 ENCOUNTER — Other Ambulatory Visit: Payer: Medicare Other

## 2020-10-04 DIAGNOSIS — I130 Hypertensive heart and chronic kidney disease with heart failure and stage 1 through stage 4 chronic kidney disease, or unspecified chronic kidney disease: Secondary | ICD-10-CM | POA: Diagnosis not present

## 2020-10-04 DIAGNOSIS — N183 Chronic kidney disease, stage 3 unspecified: Secondary | ICD-10-CM | POA: Diagnosis not present

## 2020-10-04 DIAGNOSIS — I5042 Chronic combined systolic (congestive) and diastolic (congestive) heart failure: Secondary | ICD-10-CM | POA: Diagnosis not present

## 2020-10-04 DIAGNOSIS — E1122 Type 2 diabetes mellitus with diabetic chronic kidney disease: Secondary | ICD-10-CM | POA: Diagnosis not present

## 2020-10-04 DIAGNOSIS — E1140 Type 2 diabetes mellitus with diabetic neuropathy, unspecified: Secondary | ICD-10-CM | POA: Diagnosis not present

## 2020-10-04 DIAGNOSIS — Z794 Long term (current) use of insulin: Secondary | ICD-10-CM

## 2020-10-04 DIAGNOSIS — G20 Parkinson's disease: Secondary | ICD-10-CM | POA: Diagnosis not present

## 2020-10-04 DIAGNOSIS — E1165 Type 2 diabetes mellitus with hyperglycemia: Secondary | ICD-10-CM

## 2020-10-04 LAB — BASIC METABOLIC PANEL
BUN: 18 mg/dL (ref 6–23)
CO2: 30 mEq/L (ref 19–32)
Calcium: 8.9 mg/dL (ref 8.4–10.5)
Chloride: 101 mEq/L (ref 96–112)
Creatinine, Ser: 1.44 mg/dL (ref 0.40–1.50)
GFR: 49.69 mL/min — ABNORMAL LOW (ref >60.00)
Glucose, Bld: 150 mg/dL — ABNORMAL HIGH (ref 70–99)
Potassium: 4.1 mEq/L (ref 3.5–5.1)
Sodium: 138 mEq/L (ref 135–145)

## 2020-10-04 LAB — HEMOGLOBIN A1C: Hgb A1c MFr Bld: 6.3 % (ref 4.6–6.5)

## 2020-10-06 DIAGNOSIS — G20 Parkinson's disease: Secondary | ICD-10-CM | POA: Diagnosis not present

## 2020-10-06 DIAGNOSIS — I5042 Chronic combined systolic (congestive) and diastolic (congestive) heart failure: Secondary | ICD-10-CM | POA: Diagnosis not present

## 2020-10-06 DIAGNOSIS — N183 Chronic kidney disease, stage 3 unspecified: Secondary | ICD-10-CM | POA: Diagnosis not present

## 2020-10-06 DIAGNOSIS — E1140 Type 2 diabetes mellitus with diabetic neuropathy, unspecified: Secondary | ICD-10-CM | POA: Diagnosis not present

## 2020-10-06 DIAGNOSIS — I130 Hypertensive heart and chronic kidney disease with heart failure and stage 1 through stage 4 chronic kidney disease, or unspecified chronic kidney disease: Secondary | ICD-10-CM | POA: Diagnosis not present

## 2020-10-06 DIAGNOSIS — E1122 Type 2 diabetes mellitus with diabetic chronic kidney disease: Secondary | ICD-10-CM | POA: Diagnosis not present

## 2020-10-07 ENCOUNTER — Ambulatory Visit (INDEPENDENT_AMBULATORY_CARE_PROVIDER_SITE_OTHER): Payer: Medicare Other | Admitting: Endocrinology

## 2020-10-07 ENCOUNTER — Other Ambulatory Visit: Payer: Self-pay

## 2020-10-07 VITALS — BP 130/80 | HR 63 | Ht 69.0 in | Wt 263.0 lb

## 2020-10-07 DIAGNOSIS — I255 Ischemic cardiomyopathy: Secondary | ICD-10-CM | POA: Diagnosis not present

## 2020-10-07 DIAGNOSIS — E1165 Type 2 diabetes mellitus with hyperglycemia: Secondary | ICD-10-CM | POA: Diagnosis not present

## 2020-10-07 DIAGNOSIS — E1142 Type 2 diabetes mellitus with diabetic polyneuropathy: Secondary | ICD-10-CM | POA: Diagnosis not present

## 2020-10-07 DIAGNOSIS — Z794 Long term (current) use of insulin: Secondary | ICD-10-CM

## 2020-10-07 NOTE — Patient Instructions (Addendum)
Call [facility] now [phone]  [facility] 2  Check blood sugars on waking up days a week  Also check blood sugars about 2 hours after meals and do this after different meals by rotation  Recommended blood sugar levels on waking up are 90-130 and about 2 hours after meal is 130-160  Please bring your blood sugar monitor to each visit, thank you

## 2020-10-07 NOTE — Progress Notes (Signed)
Patient ID: Henry [name], male   DOB: 12-13-1950, 70 y.o.   MRN: [id number]           Reason for Appointment: Follow-up for Type 2 Diabetes   History of Present Illness:          Diagnosis: Type 2 diabetes mellitus, date of diagnosis: 2000       Recent history:    INSULIN regimen: Omnipod insulin pump regional with Humalog insulin   Basal rate programs: Midnight = 1.6, 7 AM = 2.1, 11 a.m. = 2.7, total basal 52 units  Boluses 3-5 units for breakfast, 4-6 lunch and 6-7 units for dinner, 2-3 units for snacks  Non-insulin hypoglycemic drugs the patient is taking are: Ozempic 1 mg weekly  His A1c is unchanged at 6.3    Current management, blood sugar patterns and problems identified: He still has excellent blood sugars with average 127  However most of his readings are right at mealtimes and generally checking mostly at breakfast and dinner and sometimes at other times  He still did not call to get his freestyle libre started as discussed  He is not clear why he has gained weight  However his wife thinks that maybe he is getting more portions or snacks  His blood sugars are fairly steady before meals with only occasional rise in blood sugar overnight with extra snacks  No hypoglycemia current insulin regimen and lowest reading 90 Has not needed any adjustment in his basal settings in the last couple of visits Also does not always control his blood sugars at the time of his boluses   He has seen the dietitian in 04/2017  Not able to exercise because of back pain and difficulty walking       Side effects from medications have been: None Compliance with the medical regimen: Fair   Glucose monitoring:  usually 3 times  a day         Glucometer:  Freestyle with the OmniPod PDM       Blood Glucose readings by download of pump     PRE-MEAL Fasting Lunch Dinner Bedtime Overall  Glucose range: 90-184    90-194  Mean/median: 122 130 125  127   POST-MEAL PC Breakfast PC  Lunch PC Dinner  Glucose range:   123, 149  Mean/median:      Previously:  PRE-MEAL Fasting Lunch Dinner Bedtime Overall  Glucose range: 108-164 137 113-143    Mean/median: 118    121   POST-MEAL PC Breakfast PC Lunch PC Dinner  Glucose range: 152    Mean/median:          Meals: 9 am , 2-3 pm and 6-7 pm            Dietician visit, most recent: 4/19               Weight history:   Wt Readings from Last 3 Encounters:  10/07/20 263 lb (119.3 kg)  09/29/20 262 lb (118.8 kg)  07/27/20 257 lb (116.6 kg)    Glycemic control:   Lab Results  Component Value Date   HGBA1C 6.3 10/04/2020   HGBA1C 6.3 06/24/2020   HGBA1C 5.6 03/29/2020   Lab Results  Component Value Date   MICROALBUR <0.7 12/19/2019   LDLCALC 47 06/24/2020   CREATININE 1.44 10/04/2020    Lab Results  Component Value Date   FRUCTOSAMINE 359 (H) 07/01/2019   FRUCTOSAMINE 277 11/09/2017   FRUCTOSAMINE 350 (H) 03/29/2017  Past history:   He has been taking Amaryl for several years and probably metformin since onset also At some point he was changed from metformin to [facility] and was taking this since at least 2012 A1c had been higher in 2015 Since his A1c had been progressively higher with his regimen of Janumet and Amaryl he was started on Victoza in 01/2014 He was started on Lantus insulin in 5/16 because of persistent hyperglycemia especially fasting; was having readings as high as 293 Because of tendency to high postprandial readings and for control with Lantus he was switched to the V-go pump in  09/2014   Other active problems: See review of systems    Allergies as of 10/07/2020       Reactions   Penicillins Anaphylaxis, Rash   Because of a history of documented adverse serious drug reaction;Medi Alert bracelet  is recommended PATIENT HAS HAD A PCN REACTION WITH IMMEDIATE RASH, FACIAL/TONGUE/THROAT SWELLING, SOB, OR LIGHTHEADEDNESS WITH HYPOTENSION:  #  #  YES  #  #  Has patient had a PCN  reaction causing severe rash involving mucus membranes or skin necrosis: unknown Has patient had a PCN reaction that required hospitalization NO Has patient had a PCN reaction occurring within the last 10 years: NO   Klonopin [clonazepam] Other (See Comments)   agitation   Peanut-containing Drug Products Cough   Watermelon [citrullus Vulgaris] Other (See Comments), Cough   Tickle in throat        Medication List        Accurate as of October 07, 2020  1:16 PM. If you have any questions, ask your nurse or doctor.          acetaminophen 500 MG tablet Commonly known as: TYLENOL Take 1,000 mg by mouth at bedtime as needed for mild pain or headache.   albuterol 108 (90 Base) MCG/ACT inhaler Commonly known as: VENTOLIN HFA Inhale 2 puffs into the lungs every 6 (six) hours as needed for wheezing or shortness of breath.   allopurinol 100 MG tablet Commonly known as: ZYLOPRIM TAKE 1 & 1/2 TABLET BY MOUTH DAILY   aspirin EC 81 MG tablet Take 1 tablet (81 mg total) by mouth daily. Swallow whole.   Carbidopa-Levodopa ER 25-100 MG tablet controlled release Commonly known as: SINEMET CR TAKE 2 TABLETS AT 7AM,THEN 2TABLETS AT 11AM,THEN 2 TABS AT 3PM AND 1TAB AT 7PM   carbidopa-levodopa 50-200 MG tablet Commonly known as: SINEMET CR TAKE 1 TABLET BY MOUTH EVERYDAY AT BEDTIME   colchicine 0.6 MG tablet Take 1 tablet (0.6 mg total) by mouth daily as needed (gout).   dorzolamide-timolol 22.3-6.8 MG/ML ophthalmic solution Commonly known as: COSOPT Place 1 drop into the right eye 2 (two) times daily.   fluticasone-salmeterol 250-50 MCG/ACT Aepb Commonly known as: Advair Diskus Inhale 1 puff into the lungs in the morning and at bedtime. Rinse after use.   FREESTYLE LITE test strip Generic drug: glucose blood USE TO CHECK BLOOD SUGAR 4 TIMES DAILY.   furosemide 80 MG tablet Commonly known as: LASIX Take 1 tablet (80 mg total) by mouth daily.   gabapentin 100 MG  capsule Commonly known as: NEURONTIN TAKE 2 CAPSULES BY MOUTH EVERY MORNING AND TAKE 4 CAPSULES BY MOUTH EVERY EVENING   HumaLOG 100 UNIT/ML injection Generic drug: insulin lispro USE MAXIMUM 76 UNITS PER   DAY WITH V-GO PUMP   levothyroxine 137 MCG tablet Commonly known as: SYNTHROID TAKE 1 TABLET BY MOUTH DAILY BEFORE BREAKFAST.  metoprolol succinate 50 MG 24 hr tablet Commonly known as: TOPROL-XL Take 1 tablet (50 mg total) by mouth daily. TAKE WITH OR IMMEDIATELY FOLLOWING A MEAL.   Myrbetriq 25 MG Tb24 tablet Generic drug: mirabegron ER TAKE 1 TABLET BY MOUTH EVERY DAY   nitroGLYCERIN 0.4 MG SL tablet Commonly known as: NITROSTAT Place 1 tablet (0.4 mg total) under the tongue every 5 (five) minutes as needed for chest pain.   omega-3 acid ethyl esters 1 g capsule Commonly known as: LOVAZA TAKE 2 CAPSULES BY MOUTH 2 TIMES DAILY.   Omnipod DASH PDM (Gen 4) Kit 1 each by Does not apply route. Use Dash system for Continuous Blood Glucose Monitoring.   OVER THE COUNTER MEDICATION Apply 1 application topically daily as needed (pain). Theraworx Pain Cream   Ozempic (1 MG/DOSE) 4 MG/3ML Sopn Generic drug: Semaglutide (1 MG/DOSE) INJECT $RemoveBeforeD'1MG'miGGLMtItflHOw$  SUBCUTANEOUSLY  ONCE A WEEK.   polyethylene glycol 17 g packet Commonly known as: MIRALAX / GLYCOLAX Take 8.5 g by mouth daily.   potassium chloride SA 20 MEQ tablet Commonly known as: KLOR-CON TAKE 3 TABLETS (60 MEQ TOTAL) BY MOUTH DAILY.   rosuvastatin 20 MG tablet Commonly known as: CRESTOR TAKE 1 TABLET BY MOUTH EVERYDAY AT BEDTIME   SYSTANE BALANCE OP Place 1 drop into the left eye daily as needed (dry eyes). Uses Systane gel apply to the eye as needed   traMADol 50 MG tablet Commonly known as: ULTRAM TAKE 1 TABLET BY MOUTH TWICE A DAY   vitamin B-12 1000 MCG tablet Commonly known as: CYANOCOBALAMIN Take 1,000 mcg by mouth daily.   Vitamin D 50 MCG (2000 UT) tablet Take 2,000 Units by mouth daily.         Allergies:  Allergies  Allergen Reactions   Penicillins Anaphylaxis and Rash    Because of a history of documented adverse serious drug reaction;Medi Alert bracelet  is recommended PATIENT HAS HAD A PCN REACTION WITH IMMEDIATE RASH, FACIAL/TONGUE/THROAT SWELLING, SOB, OR LIGHTHEADEDNESS WITH HYPOTENSION:  #  #  YES  #  #  Has patient had a PCN reaction causing severe rash involving mucus membranes or skin necrosis: unknown Has patient had a PCN reaction that required hospitalization NO Has patient had a PCN reaction occurring within the last 10 years: NO   Klonopin [Clonazepam] Other (See Comments)    agitation   Peanut-Containing Drug Products Cough   Watermelon [Citrullus Vulgaris] Other (See Comments) and Cough    Tickle in throat    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)   DM (diabetes mellitus) ([place])    TYPE 2 - insulin pump   Dysrhythmia    a-fib   GERD (gastroesophageal reflux disease)    Glaucoma    right eye   History of kidney stones    multiple   HLD (hyperlipidemia)    HTN (hypertension)    pt denies 08/19/12   Hyperplasia, prostate    Hypothyroidism    MI (myocardial infarction) [facility])    Dr Stanford Breed 2000, x3vessels bypass  OSA (obstructive sleep apnea)    AHI-28,on CPAP, noncompliant with CPAP   Parkinson disease ([place])    1999   PONV (postoperative nausea and vomiting)    Restless legs    Shortness of breath    Hx: of at all times   UTI (lower urinary tract infection) 09/15/12   Klebsiella   Ventral hernia    Walker as ambulation aid    also uses wheelchair at home/when going out    Past Surgical  History:  Procedure Laterality Date   [place]   right total loss   BIOPSY  10/14/2018   Procedure: BIOPSY;  Surgeon: Jerene Bears, MD;  Location: [facility];  Service: Gastroenterology;;   CARDIAC CATHETERIZATION     CATARACT EXTRACTION W/ INTRAOCULAR LENS IMPLANT     Hx: of right eye   CATARACT EXTRACTION W/ INTRAOCULAR LENS IMPLANT Left 2018   COLONOSCOPY N/A 10/13/2014   Procedure: COLONOSCOPY;  Surgeon: Jerene Bears, MD;  Location: [facility];  Service: Gastroenterology;  Laterality: N/A;   COLONOSCOPY W/ BIOPSIES AND POLYPECTOMY     Hx: of   COLONOSCOPY WITH PROPOFOL N/A 10/14/2018   Procedure: COLONOSCOPY WITH PROPOFOL;  Surgeon: Jerene Bears, MD;  Location: [facility];  Service: Gastroenterology;  Laterality: N/A;   CORONARY ARTERY BYPASS GRAFT  2000   [name], MD   CORONARY STENT PLACEMENT  1998   DEEP BRAIN STIMULATOR PLACEMENT  2004   Right and left VIN stimulator placement (parkinsons)   EYE SURGERY     FINGER AMPUTATION     left pointer   IMPLANTABLE CARDIOVERTER DEFIBRILLATOR IMPLANT N/A 11/13/2013   Procedure: IMPLANTABLE CARDIOVERTER DEFIBRILLATOR IMPLANT;  Surgeon: Evans Lance, MD;  Location: [facility] CATH LAB;  Service: Cardiovascular;  Laterality: N/A;   INSERT / REPLACE / REMOVE PACEMAKER     medtronic   LEFT AND RIGHT HEART CATHETERIZATION WITH CORONARY ANGIOGRAM N/A 09/24/2013   Procedure: LEFT AND RIGHT HEART CATHETERIZATION WITH CORONARY ANGIOGRAM;  Surgeon: Burnell Blanks, MD;  Location: [facility] CATH LAB;  Service: Cardiovascular;  Laterality: N/A;   LITHOTRIPSY     3 different times   MEDIAN STERNOTOMY  2000   POLYPECTOMY  10/14/2018   Procedure: POLYPECTOMY;  Surgeon: Jerene Bears, MD;  Location: [facility];  Service: Gastroenterology;;   PULSE GENERATOR IMPLANT Right 11/13/2017   Procedure: Right chest implantable pulse generator change;  Surgeon: Erline Levine, MD;  Location: [place];  Service: Neurosurgery;  Laterality:  Right;  Right chest implantable pulse generator change   SUBTHALAMIC STIMULATOR BATTERY REPLACEMENT N/A 09/05/2012   Procedure: Deep brain stimulator battery change;  Surgeon: Erline Levine, MD;  Location: [place] [facility];  Service: Neurosurgery;  Laterality: N/A;  Deep brain stimulator battery change   SUBTHALAMIC STIMULATOR BATTERY REPLACEMENT N/A 06/10/2015   Procedure: Deep Brain stimulator battery change;  Surgeon: Erline Levine, MD;  Location: [place] [facility];  Service: Neurosurgery;  Laterality: N/A;   SUBTHALAMIC STIMULATOR BATTERY REPLACEMENT Right 09/25/2019   Procedure: Deep brain stimulator battery change;  Surgeon: Erline Levine, MD;  Location: [place];  Service: Neurosurgery;  Laterality: Right;   TONSILLECTOMY      Family History  Problem Relation Age of Onset   Aneurysm Mother    Alcoholism Father    HIV/AIDS Brother 11       AIDS   Healthy Sister    Healthy Child    Peripheral vascular disease Other    Arthritis Other  Healthy Sister    Healthy Child    Healthy Child    Diabetes Neg Hx    Heart disease Neg Hx    Colon cancer Neg Hx    Prostate cancer Neg Hx     Social History:  reports that he has never smoked. He has never used smokeless tobacco. He reports that he does not currently use alcohol. He reports that he does not use drugs.    Review of Systems     HYPERLIPIDEMIA:  He is on Crestor 20 mg with good control of LDL, has low HDL Triglycerides are last at 150, previously over 200, treated with Lovaza 2 g twice a day HDL has improved      Lab Results  Component Value Date   CHOL 110 06/24/2020   CHOL 130 04/24/2019   CHOL 116 09/18/2018   Lab Results  Component Value Date   HDL 32.30 (L) 06/24/2020   HDL 23.20 (L) 04/24/2019   HDL 25.80 (L) 09/18/2018   Lab Results  Component Value Date   LDLCALC 47 06/24/2020   LDLCALC 118 (H) 09/16/2015   LDLCALC 126 (H) 04/08/2013   Lab Results  Component Value Date   TRIG 150.0 (H) 06/24/2020   TRIG 297.0  (H) 04/24/2019   TRIG 270.0 (H) 09/18/2018   Lab Results  Component Value Date   CHOLHDL 3 06/24/2020   CHOLHDL 6 04/24/2019   CHOLHDL 4 09/18/2018   Lab Results  Component Value Date   LDLDIRECT 51.0 04/24/2019   LDLDIRECT 54.0 09/18/2018   LDLDIRECT 67.0 03/12/2018              Lab Results  Component Value Date   ALT 3 06/24/2020        HYPOTHYROIDISM:   He has had hypothyroidism following radioactive iodine treatment for hyperthyroidism in 2013   Last TSH has been normal with taking 137 mcg levothyroxine, 7.5 tablets a week   Lab Results  Component Value Date   TSH 3.09 06/24/2020   TSH 2.13 03/24/2020   TSH 4.66 (H) 12/19/2019   FREET4 0.77 04/24/2019   FREET4 0.88 08/16/2018   FREET4 0.75 03/12/2018   Has chronic systolic CHF Prescribed 80 mg Lasix daily without metolazone   Blood pressure history:  BP Readings from Last 3 Encounters:  10/07/20 130/80  09/29/20 118/72  07/27/20 110/72    CKD: Has variable creatinine levels as follows: Has been followed by nephrologist, Wilder Glade was stopped when he had UTI in 5/22 No history of microalbuminuria   Lab Results  Component Value Date   CREATININE 1.44 10/04/2020   CREATININE 1.52 (H) 06/24/2020   CREATININE 1.93 (H) 05/24/2020    Neuropathy: Taking gabapentin bid from his PCP. Has history of numbness in his toes  Diabetic foot exam in 9/22  Last eye exam 11/20, negative  Physical Examination:  BP 130/80   Pulse 63   Ht $R'5\' 9"'tf$  (1.753 m)   Wt 263 lb (119.3 kg)   SpO2 97%   BMI 38.84 kg/m    Diabetic Foot Exam - Simple   Simple Foot Form Diabetic Foot exam was performed with the following findings: Yes   Visual Inspection No deformities, no ulcerations, no other skin breakdown bilaterally: Yes See comments: Yes Sensation Testing Intact to touch and monofilament testing bilaterally: Yes Pulse Check Posterior Tibialis and Dorsalis pulse intact bilaterally: Yes Comments Hallux valgus  b/l No edema      ASSESSMENT/PLAN  Diabetes type 2, insulin-dependent with obesity  See history of present illness for detailed discussion of his current management, blood sugar patterns and problems identified  His A1c is consistently in the normal range recently and again 6.3  He is using the Omni pod insulin pump with the older PDM, also Ozempic 1 mg weekly Blood sugars are fairly stable in the low 100 range most of the time although checking blood sugars primarily before meals Although he has bolus based on his blood sugar readings he is not always entering his glucose in the pump This is partly because his wife is mostly helping him with pump management and she is not present during the day  He may have gained weight from being more lax on his diet As usual cannot exercise  Recommendations   No change in insulin pump settings needed Reminded him to check more readings after meals Discussed blood sugar targets both before and after meals Enter blood sugar at the time of bolus every time he is eating Given the phone number for [employer] company and he will contact them to start the process for doing the freestyle libre  Discussed that with the Elenor Legato he will need to check blood sugars 4 times a day and also enters blood sugars in the pump manually  Needs to back on high calorie and high carbohydrate foods and snacks  Follow-up in 4 months  HYPOTHYROIDISM: TSH previously normal, will order labs for next visit also  RENAL dysfunction:  stable and better again  Mild neuropathy: Has only minimal findings on exam  There are no Patient Instructions on file for this visit.       Elayne Snare 10/07/2020, 1:16 PM   Note: This office note was prepared with Dragon voice recognition system technology. Any transcriptional errors that result from this process are unintentional.

## 2020-10-10 ENCOUNTER — Other Ambulatory Visit: Payer: Self-pay | Admitting: Neurology

## 2020-10-10 DIAGNOSIS — G20 Parkinson's disease: Secondary | ICD-10-CM

## 2020-10-11 ENCOUNTER — Other Ambulatory Visit: Payer: Self-pay

## 2020-10-11 DIAGNOSIS — I130 Hypertensive heart and chronic kidney disease with heart failure and stage 1 through stage 4 chronic kidney disease, or unspecified chronic kidney disease: Secondary | ICD-10-CM | POA: Diagnosis not present

## 2020-10-11 DIAGNOSIS — N183 Chronic kidney disease, stage 3 unspecified: Secondary | ICD-10-CM | POA: Diagnosis not present

## 2020-10-11 DIAGNOSIS — G20 Parkinson's disease: Secondary | ICD-10-CM | POA: Diagnosis not present

## 2020-10-11 DIAGNOSIS — I5042 Chronic combined systolic (congestive) and diastolic (congestive) heart failure: Secondary | ICD-10-CM | POA: Diagnosis not present

## 2020-10-11 DIAGNOSIS — E1140 Type 2 diabetes mellitus with diabetic neuropathy, unspecified: Secondary | ICD-10-CM | POA: Diagnosis not present

## 2020-10-11 DIAGNOSIS — E1122 Type 2 diabetes mellitus with diabetic chronic kidney disease: Secondary | ICD-10-CM | POA: Diagnosis not present

## 2020-10-13 DIAGNOSIS — I130 Hypertensive heart and chronic kidney disease with heart failure and stage 1 through stage 4 chronic kidney disease, or unspecified chronic kidney disease: Secondary | ICD-10-CM | POA: Diagnosis not present

## 2020-10-13 DIAGNOSIS — G20 Parkinson's disease: Secondary | ICD-10-CM | POA: Diagnosis not present

## 2020-10-13 DIAGNOSIS — I5042 Chronic combined systolic (congestive) and diastolic (congestive) heart failure: Secondary | ICD-10-CM | POA: Diagnosis not present

## 2020-10-13 DIAGNOSIS — N183 Chronic kidney disease, stage 3 unspecified: Secondary | ICD-10-CM | POA: Diagnosis not present

## 2020-10-13 DIAGNOSIS — E1140 Type 2 diabetes mellitus with diabetic neuropathy, unspecified: Secondary | ICD-10-CM | POA: Diagnosis not present

## 2020-10-13 DIAGNOSIS — E1122 Type 2 diabetes mellitus with diabetic chronic kidney disease: Secondary | ICD-10-CM | POA: Diagnosis not present

## 2020-10-16 ENCOUNTER — Other Ambulatory Visit: Payer: Self-pay | Admitting: Family Medicine

## 2020-10-18 DIAGNOSIS — I5042 Chronic combined systolic (congestive) and diastolic (congestive) heart failure: Secondary | ICD-10-CM | POA: Diagnosis not present

## 2020-10-18 DIAGNOSIS — E1140 Type 2 diabetes mellitus with diabetic neuropathy, unspecified: Secondary | ICD-10-CM | POA: Diagnosis not present

## 2020-10-18 DIAGNOSIS — G20 Parkinson's disease: Secondary | ICD-10-CM | POA: Diagnosis not present

## 2020-10-18 DIAGNOSIS — I130 Hypertensive heart and chronic kidney disease with heart failure and stage 1 through stage 4 chronic kidney disease, or unspecified chronic kidney disease: Secondary | ICD-10-CM | POA: Diagnosis not present

## 2020-10-18 DIAGNOSIS — N183 Chronic kidney disease, stage 3 unspecified: Secondary | ICD-10-CM | POA: Diagnosis not present

## 2020-10-18 DIAGNOSIS — E1122 Type 2 diabetes mellitus with diabetic chronic kidney disease: Secondary | ICD-10-CM | POA: Diagnosis not present

## 2020-10-20 ENCOUNTER — Ambulatory Visit (INDEPENDENT_AMBULATORY_CARE_PROVIDER_SITE_OTHER): Payer: Medicare Other

## 2020-10-20 DIAGNOSIS — I255 Ischemic cardiomyopathy: Secondary | ICD-10-CM | POA: Diagnosis not present

## 2020-10-20 NOTE — Telephone Encounter (Signed)
Refill request for Tramadol 50 mg tablets  LOV - 07/27/20 Next OV - not scheduled Last refill - 07/18/20 #60/2

## 2020-10-21 LAB — CUP PACEART REMOTE DEVICE CHECK
Battery Remaining Longevity: 39 mo
Battery Voltage: 2.95 V
Brady Statistic AP VP Percent: 0 %
Brady Statistic AP VS Percent: 0.79 %
Brady Statistic AS VP Percent: 0.06 %
Brady Statistic AS VS Percent: 99.14 %
Brady Statistic RA Percent Paced: 0.79 %
Brady Statistic RV Percent Paced: 0.07 %
Date Time Interrogation Session: 20221012091807
HighPow Impedance: 71 Ohm
Implantable Lead Implant Date: 20151105
Implantable Lead Implant Date: 20151105
Implantable Lead Location: 753859
Implantable Lead Location: 753860
Implantable Lead Model: 5076
Implantable Pulse Generator Implant Date: 20151105
Lead Channel Impedance Value: 399 Ohm
Lead Channel Impedance Value: 437 Ohm
Lead Channel Impedance Value: 456 Ohm
Lead Channel Pacing Threshold Amplitude: 0.375 V
Lead Channel Pacing Threshold Amplitude: 1.125 V
Lead Channel Pacing Threshold Pulse Width: 0.4 ms
Lead Channel Pacing Threshold Pulse Width: 0.4 ms
Lead Channel Sensing Intrinsic Amplitude: 1.75 mV
Lead Channel Sensing Intrinsic Amplitude: 1.75 mV
Lead Channel Sensing Intrinsic Amplitude: 18.5 mV
Lead Channel Sensing Intrinsic Amplitude: 18.5 mV
Lead Channel Setting Pacing Amplitude: 2.25 V
Lead Channel Setting Pacing Amplitude: 2.5 V
Lead Channel Setting Pacing Pulse Width: 0.4 ms
Lead Channel Setting Sensing Sensitivity: 0.3 mV

## 2020-10-25 DIAGNOSIS — G20 Parkinson's disease: Secondary | ICD-10-CM | POA: Diagnosis not present

## 2020-10-25 DIAGNOSIS — I130 Hypertensive heart and chronic kidney disease with heart failure and stage 1 through stage 4 chronic kidney disease, or unspecified chronic kidney disease: Secondary | ICD-10-CM | POA: Diagnosis not present

## 2020-10-25 DIAGNOSIS — N183 Chronic kidney disease, stage 3 unspecified: Secondary | ICD-10-CM | POA: Diagnosis not present

## 2020-10-25 DIAGNOSIS — I5042 Chronic combined systolic (congestive) and diastolic (congestive) heart failure: Secondary | ICD-10-CM | POA: Diagnosis not present

## 2020-10-25 DIAGNOSIS — E1122 Type 2 diabetes mellitus with diabetic chronic kidney disease: Secondary | ICD-10-CM | POA: Diagnosis not present

## 2020-10-25 DIAGNOSIS — E1140 Type 2 diabetes mellitus with diabetic neuropathy, unspecified: Secondary | ICD-10-CM | POA: Diagnosis not present

## 2020-10-28 NOTE — Progress Notes (Signed)
Remote ICD transmission.   

## 2020-11-01 DIAGNOSIS — I130 Hypertensive heart and chronic kidney disease with heart failure and stage 1 through stage 4 chronic kidney disease, or unspecified chronic kidney disease: Secondary | ICD-10-CM | POA: Diagnosis not present

## 2020-11-01 DIAGNOSIS — N183 Chronic kidney disease, stage 3 unspecified: Secondary | ICD-10-CM | POA: Diagnosis not present

## 2020-11-01 DIAGNOSIS — G20 Parkinson's disease: Secondary | ICD-10-CM | POA: Diagnosis not present

## 2020-11-01 DIAGNOSIS — E1140 Type 2 diabetes mellitus with diabetic neuropathy, unspecified: Secondary | ICD-10-CM | POA: Diagnosis not present

## 2020-11-01 DIAGNOSIS — E1122 Type 2 diabetes mellitus with diabetic chronic kidney disease: Secondary | ICD-10-CM | POA: Diagnosis not present

## 2020-11-01 DIAGNOSIS — I5042 Chronic combined systolic (congestive) and diastolic (congestive) heart failure: Secondary | ICD-10-CM | POA: Diagnosis not present

## 2020-11-02 DIAGNOSIS — N183 Chronic kidney disease, stage 3 unspecified: Secondary | ICD-10-CM | POA: Diagnosis not present

## 2020-11-02 DIAGNOSIS — I252 Old myocardial infarction: Secondary | ICD-10-CM | POA: Diagnosis not present

## 2020-11-02 DIAGNOSIS — Z9181 History of falling: Secondary | ICD-10-CM | POA: Diagnosis not present

## 2020-11-02 DIAGNOSIS — I130 Hypertensive heart and chronic kidney disease with heart failure and stage 1 through stage 4 chronic kidney disease, or unspecified chronic kidney disease: Secondary | ICD-10-CM | POA: Diagnosis not present

## 2020-11-02 DIAGNOSIS — I5042 Chronic combined systolic (congestive) and diastolic (congestive) heart failure: Secondary | ICD-10-CM | POA: Diagnosis not present

## 2020-11-02 DIAGNOSIS — Z95810 Presence of automatic (implantable) cardiac defibrillator: Secondary | ICD-10-CM | POA: Diagnosis not present

## 2020-11-02 DIAGNOSIS — R350 Frequency of micturition: Secondary | ICD-10-CM | POA: Diagnosis not present

## 2020-11-02 DIAGNOSIS — E1140 Type 2 diabetes mellitus with diabetic neuropathy, unspecified: Secondary | ICD-10-CM | POA: Diagnosis not present

## 2020-11-02 DIAGNOSIS — G20 Parkinson's disease: Secondary | ICD-10-CM | POA: Diagnosis not present

## 2020-11-02 DIAGNOSIS — J45909 Unspecified asthma, uncomplicated: Secondary | ICD-10-CM | POA: Diagnosis not present

## 2020-11-02 DIAGNOSIS — Z951 Presence of aortocoronary bypass graft: Secondary | ICD-10-CM | POA: Diagnosis not present

## 2020-11-02 DIAGNOSIS — E785 Hyperlipidemia, unspecified: Secondary | ICD-10-CM | POA: Diagnosis not present

## 2020-11-02 DIAGNOSIS — I2510 Atherosclerotic heart disease of native coronary artery without angina pectoris: Secondary | ICD-10-CM | POA: Diagnosis not present

## 2020-11-02 DIAGNOSIS — Z794 Long term (current) use of insulin: Secondary | ICD-10-CM | POA: Diagnosis not present

## 2020-11-02 DIAGNOSIS — I429 Cardiomyopathy, unspecified: Secondary | ICD-10-CM | POA: Diagnosis not present

## 2020-11-02 DIAGNOSIS — E1122 Type 2 diabetes mellitus with diabetic chronic kidney disease: Secondary | ICD-10-CM | POA: Diagnosis not present

## 2020-11-08 DIAGNOSIS — E1122 Type 2 diabetes mellitus with diabetic chronic kidney disease: Secondary | ICD-10-CM | POA: Diagnosis not present

## 2020-11-08 DIAGNOSIS — I5042 Chronic combined systolic (congestive) and diastolic (congestive) heart failure: Secondary | ICD-10-CM | POA: Diagnosis not present

## 2020-11-08 DIAGNOSIS — G20 Parkinson's disease: Secondary | ICD-10-CM | POA: Diagnosis not present

## 2020-11-08 DIAGNOSIS — N183 Chronic kidney disease, stage 3 unspecified: Secondary | ICD-10-CM | POA: Diagnosis not present

## 2020-11-08 DIAGNOSIS — E1140 Type 2 diabetes mellitus with diabetic neuropathy, unspecified: Secondary | ICD-10-CM | POA: Diagnosis not present

## 2020-11-08 DIAGNOSIS — I130 Hypertensive heart and chronic kidney disease with heart failure and stage 1 through stage 4 chronic kidney disease, or unspecified chronic kidney disease: Secondary | ICD-10-CM | POA: Diagnosis not present

## 2020-11-11 ENCOUNTER — Other Ambulatory Visit: Payer: Self-pay

## 2020-11-11 ENCOUNTER — Encounter: Payer: Self-pay | Admitting: Podiatry

## 2020-11-11 ENCOUNTER — Ambulatory Visit (INDEPENDENT_AMBULATORY_CARE_PROVIDER_SITE_OTHER): Payer: Medicare Other | Admitting: Podiatry

## 2020-11-11 DIAGNOSIS — B351 Tinea unguium: Secondary | ICD-10-CM | POA: Diagnosis not present

## 2020-11-11 DIAGNOSIS — M79674 Pain in right toe(s): Secondary | ICD-10-CM

## 2020-11-11 DIAGNOSIS — M79675 Pain in left toe(s): Secondary | ICD-10-CM

## 2020-11-12 NOTE — Progress Notes (Signed)
Subjective:   Patient ID: [name], male   DOB: 70 y.o.   MRN: [id number]   HPI Patient presents with long-term Parkinson's with thickened nailbeds 1-5 both feet that he is having trouble cutting and caregivers having trouble with increased discoloration and thickness of the left big toenail   ROS      Objective:  Physical Exam  Neurovascular status unchanged with patient noted to have thick yellow brittle nailbeds 1-5 both feet and thickness especially of the left hallux which may have been damaged due to his gait shuffling process     Assessment:  Mycotic nail infection 1-5 both feet with pain     Plan:  H&P reviewed condition debrided nailbeds 1-5 both feet no iatrogenic bleeding reappoint routine care

## 2020-11-14 ENCOUNTER — Other Ambulatory Visit: Payer: Self-pay | Admitting: Endocrinology

## 2020-11-15 DIAGNOSIS — I5042 Chronic combined systolic (congestive) and diastolic (congestive) heart failure: Secondary | ICD-10-CM | POA: Diagnosis not present

## 2020-11-15 DIAGNOSIS — I130 Hypertensive heart and chronic kidney disease with heart failure and stage 1 through stage 4 chronic kidney disease, or unspecified chronic kidney disease: Secondary | ICD-10-CM | POA: Diagnosis not present

## 2020-11-15 DIAGNOSIS — N183 Chronic kidney disease, stage 3 unspecified: Secondary | ICD-10-CM | POA: Diagnosis not present

## 2020-11-15 DIAGNOSIS — E1122 Type 2 diabetes mellitus with diabetic chronic kidney disease: Secondary | ICD-10-CM | POA: Diagnosis not present

## 2020-11-15 DIAGNOSIS — E1140 Type 2 diabetes mellitus with diabetic neuropathy, unspecified: Secondary | ICD-10-CM | POA: Diagnosis not present

## 2020-11-15 DIAGNOSIS — G20 Parkinson's disease: Secondary | ICD-10-CM | POA: Diagnosis not present

## 2020-11-18 ENCOUNTER — Other Ambulatory Visit: Payer: Self-pay | Admitting: Family Medicine

## 2020-11-18 ENCOUNTER — Other Ambulatory Visit: Payer: Self-pay | Admitting: Endocrinology

## 2020-11-24 DIAGNOSIS — G20 Parkinson's disease: Secondary | ICD-10-CM | POA: Diagnosis not present

## 2020-11-24 DIAGNOSIS — I5042 Chronic combined systolic (congestive) and diastolic (congestive) heart failure: Secondary | ICD-10-CM | POA: Diagnosis not present

## 2020-11-24 DIAGNOSIS — E1140 Type 2 diabetes mellitus with diabetic neuropathy, unspecified: Secondary | ICD-10-CM | POA: Diagnosis not present

## 2020-11-24 DIAGNOSIS — I130 Hypertensive heart and chronic kidney disease with heart failure and stage 1 through stage 4 chronic kidney disease, or unspecified chronic kidney disease: Secondary | ICD-10-CM | POA: Diagnosis not present

## 2020-11-24 DIAGNOSIS — N183 Chronic kidney disease, stage 3 unspecified: Secondary | ICD-10-CM | POA: Diagnosis not present

## 2020-11-24 DIAGNOSIS — E1122 Type 2 diabetes mellitus with diabetic chronic kidney disease: Secondary | ICD-10-CM | POA: Diagnosis not present

## 2020-11-26 NOTE — Progress Notes (Signed)
Assessment/Plan:   1.  Parkinsons Disease             -Status post bilateral DBS STN surgery             -Battery changes occurring September 05, 2012; June 10, 2015; November 13, 2017; 09/25/19             -Patient needs to be directly monitored if he is going to ambulate.  That being said, he looks much better than he did.  He is currently in physical therapy.             -Take carbidopa/levodopa 25/100 CR, currently taking 2 tablets up to 5 times per day (generally takes 9 or 10 tablets/day)             -Take carbidopa/levodopa 50/200 CR at bedtime   -add ST via amedesis  2.  RBD             -He has tried clonazepam several times and ultimately ends up stopping it because of side effect.  Could try Rozerem in the future if insurance would pay.  Decided to hold off for now as the patient states that while dreaming is vivid, it is not particularly bothersome and he is not falling out of the bed.  Discussed bedroom safety. 3.  OSAS, noncompliant with CPAP.             -Patient aware of risks/morbidity and mortality associated with untreated sleep apnea 4.  CHF             -ICD placed on 11/15/2013  -DBS will need placed into special mode if pt ever requires cardioversion 5.  Facial paresthesias             -Has been going on for few years and became bilateral, not c/w TN.  He has had several negative noncontrast CTs of the brain.  Kidney function is not good enough for contrasted CT.  His DBS device is not MRI compatible.             -I am unsure what this is, and offered to try carbamazepine, but he really did not want that.             -I have turned off his DBS and he noticed no change in the facial paresthesias. 6.  Pruritus  -Patient thought this was a prodrome to shingles.  I told him I did not think so.  Crossed several sensory dermatomal levels.  He can follow-up with his primary care physician in this regard.    Subjective:   Henry [name] was seen today in follow up for  Parkinsons disease.  My previous records were reviewed prior to todays visit as well as outside records available to me.  Patient with wife who supplements history.  He does have some intermittent tremor.  in ED on 7/12 for fall.  He had a minimally displaced fx of the tip of the nasal bone.  Otherwise, CT head was negative.  He saw Dr. Lucianne Muss on September 29.  His A1c was 6.3.  He is on Ozempic.  They are doing PT.  Asks about ST.  He is taking closer to 9-10 dosages of levodopa per day, in addition to his CR at bedtime.  That being said, wife and patient both report that he is actually doing very well and is walking again.  Patient asks me if I think he potentially has shingles on his left  arm.  It is itching.  He has no rash.  It is itching on both the front and back of the arm.  Current prescribed movement disorder medications: Carbidopa/levodopa 25/100 CR, 2 tablets at 7 AM/11 AM/3 PM/1 tablet at 7 PM Carbidopa/levodopa 50/200 CR at bedtime    ALLERGIES:   Allergies  Allergen Reactions   Penicillins Anaphylaxis and Rash    Because of a history of documented adverse serious drug reaction;Medi Alert bracelet  is recommended PATIENT HAS HAD A PCN REACTION WITH IMMEDIATE RASH, FACIAL/TONGUE/THROAT SWELLING, SOB, OR LIGHTHEADEDNESS WITH HYPOTENSION:  #  #  YES  #  #  Has patient had a PCN reaction causing severe rash involving mucus membranes or skin necrosis: unknown Has patient had a PCN reaction that required hospitalization NO Has patient had a PCN reaction occurring within the last 10 years: NO   Klonopin [Clonazepam] Other (See Comments)    agitation   Peanut-Containing Drug Products Cough   Watermelon [Citrullus Vulgaris] Other (See Comments) and Cough    Tickle in throat    CURRENT MEDICATIONS:  Outpatient Encounter Medications as of 11/30/2020  Medication Sig   acetaminophen (TYLENOL) 500 MG tablet Take 1,000 mg by mouth at bedtime as needed for mild pain or headache.    albuterol  (VENTOLIN HFA) 108 (90 Base) MCG/ACT inhaler Inhale 2 puffs into the lungs every 6 (six) hours as needed for wheezing or shortness of breath.   allopurinol (ZYLOPRIM) 100 MG tablet TAKE 1 & 1/2 TABLET BY MOUTH DAILY   aspirin EC 81 MG tablet Take 1 tablet (81 mg total) by mouth daily. Swallow whole.   carbidopa-levodopa (SINEMET CR) 50-200 MG tablet TAKE 1 TABLET BY MOUTH EVERYDAY AT BEDTIME   Carbidopa-Levodopa ER (SINEMET CR) 25-100 MG tablet controlled release TAKE 2 TABLETS AT 7AM,THEN 2TABLETS AT 11AM,THEN 2 TABS AT 3PM AND 1TAB AT 7PM   Cholecalciferol (VITAMIN D) 50 MCG (2000 UT) tablet Take 2,000 Units by mouth daily.    colchicine 0.6 MG tablet Take 1 tablet (0.6 mg total) by mouth daily as needed (gout).   dorzolamide-timolol (COSOPT) 22.3-6.8 MG/ML ophthalmic solution Place 1 drop into the right eye 2 (two) times daily.   fluticasone-salmeterol (ADVAIR DISKUS) 250-50 MCG/ACT AEPB Inhale 1 puff into the lungs in the morning and at bedtime. Rinse after use.   FREESTYLE LITE test strip USE TO CHECK BLOOD SUGAR 4 TIMES DAILY.   furosemide (LASIX) 80 MG tablet TAKE 1 TABLET BY MOUTH EVERY DAY   gabapentin (NEURONTIN) 100 MG capsule TAKE 2 CAPSULES BY MOUTH EVERY MORNING AND TAKE 4 CAPSULES BY MOUTH EVERY EVENING   HUMALOG 100 UNIT/ML injection USE MAXIMUM 76 UNITS PER   DAY WITH V-GO PUMP   Insulin Disposable Pump (OMNIPOD DASH SYSTEM) KIT 1 each by Does not apply route. Use Dash system for Continuous Blood Glucose Monitoring.   levothyroxine (SYNTHROID) 137 MCG tablet TAKE 1 TABLET BY MOUTH DAILY BEFORE BREAKFAST.   metoprolol succinate (TOPROL-XL) 50 MG 24 hr tablet TAKE 1 TABLET BY MOUTH DAILY. TAKE WITH OR IMMEDIATELY FOLLOWING A MEAL.   MYRBETRIQ 25 MG TB24 tablet TAKE 1 TABLET BY MOUTH EVERY DAY   omega-3 acid ethyl esters (LOVAZA) 1 g capsule TAKE 2 CAPSULES BY MOUTH TWICE A DAY   OVER THE COUNTER MEDICATION Apply 1 application topically daily as needed (pain). Theraworx Pain Cream    OZEMPIC, 1 MG/DOSE, 4 MG/3ML SOPN INJECT $RemoveBef'1MG'uplkUpuTHU$  SUBCUTANEOUSLY  ONCE A WEEK.   polyethylene glycol (MIRALAX /  GLYCOLAX) packet Take 8.5 g by mouth daily.   potassium chloride SA (KLOR-CON) 20 MEQ tablet TAKE 3 TABLETS (60 MEQ TOTAL) BY MOUTH DAILY.   Propylene Glycol (SYSTANE BALANCE OP) Place 1 drop into the left eye daily as needed (dry eyes). Uses Systane gel apply to the eye as needed   rosuvastatin (CRESTOR) 20 MG tablet TAKE 1 TABLET BY MOUTH EVERYDAY AT BEDTIME   traMADol (ULTRAM) 50 MG tablet TAKE 1 TABLET BY MOUTH TWICE A DAY   vitamin B-12 (CYANOCOBALAMIN) 1000 MCG tablet Take 1,000 mcg by mouth daily.   nitroGLYCERIN (NITROSTAT) 0.4 MG SL tablet Place 1 tablet (0.4 mg total) under the tongue every 5 (five) minutes as needed for chest pain.   No facility-administered encounter medications on file as of 11/30/2020.    Objective:   PHYSICAL EXAMINATION:    VITALS:   Vitals:   11/30/20 0930  BP: 137/62  Pulse: 76  SpO2: 96%  Weight: 265 lb 12.8 oz (120.6 kg)  Height: $Remove'5\' 9"'KuXjrDP$  (1.753 m)     GEN:  The patient appears stated age and is in NAD. HEENT:  Normocephalic, atraumatic.  The mucous membranes are moist. The superficial temporal arteries are without ropiness or tenderness.   Neurological examination:  Orientation: The patient is alert and oriented to person, place, and time.  He remembers that this examiner has twins and asks about them, but at other times he seems to go off on tangential speech. Cranial nerves: There is good facial symmetry with facial hypomimia. The speech is fluent and dysarthric (baseline) he is hypophonic.Marland Kitchen Soft palate rises symmetrically and there is no tongue deviation. Hearing is intact to conversational tone. Sensation: Sensation is intact to light touch throughout Motor: Strength is at least antigravity x4.  Movement examination: Tone: There is normal tone in the upper and lower extremities Abnormal movements: Very rare rest tremor on the right  today Coordination:  There is decremation with RAM's, mostly with foot taps on the left.   Gait and Station: Patient had his U step in the office today.  He was flexed at the waist, but actually ambulated quite well out of the office.  I have reviewed and interpreted the following labs independently    Chemistry      Component Value Date/Time   NA 138 10/04/2020 0930   NA 142 12/22/2016 1233   K 4.1 10/04/2020 0930   CL 101 10/04/2020 0930   CO2 30 10/04/2020 0930   BUN 18 10/04/2020 0930   BUN 18 07/01/2018 0000   CREATININE 1.44 10/04/2020 0930   CREATININE 2.13 06/25/2017 0000   CREATININE 1.53 (H) 05/22/2016 1106      Component Value Date/Time   CALCIUM 8.9 10/04/2020 0930   ALKPHOS 87 06/24/2020 0855   AST 5 06/24/2020 0855   ALT 3 06/24/2020 0855   BILITOT 0.6 06/24/2020 0855       Lab Results  Component Value Date   WBC 12.2 (H) 05/24/2020   HGB 14.8 05/24/2020   HCT 44.0 05/24/2020   MCV 84.2 05/24/2020   PLT 121.0 (L) 05/24/2020    Lab Results  Component Value Date   TSH 3.09 06/24/2020     Total time spent on today's visit was 30 minutes, including both face-to-face time and nonface-to-face time.  Time included that spent on review of records (prior notes available to me/labs/imaging if pertinent), discussing treatment and goals, answering patient's questions and coordinating care.  This was not inclusive of DBS time.  Cc:  Tonia Ghent, MD

## 2020-11-30 ENCOUNTER — Ambulatory Visit (INDEPENDENT_AMBULATORY_CARE_PROVIDER_SITE_OTHER): Payer: Medicare Other | Admitting: Neurology

## 2020-11-30 ENCOUNTER — Other Ambulatory Visit: Payer: Self-pay

## 2020-11-30 VITALS — BP 137/62 | HR 76 | Ht 69.0 in | Wt 265.8 lb

## 2020-11-30 DIAGNOSIS — G20 Parkinson's disease: Secondary | ICD-10-CM | POA: Diagnosis not present

## 2020-11-30 DIAGNOSIS — I255 Ischemic cardiomyopathy: Secondary | ICD-10-CM | POA: Diagnosis not present

## 2020-11-30 MED ORDER — CARBIDOPA-LEVODOPA ER 25-100 MG PO TBCR: EXTENDED_RELEASE_TABLET | ORAL | 1 refills | Status: DC

## 2020-11-30 NOTE — Procedures (Signed)
DBS Programming was performed.    Manufacturer of DBS device: Medtronic  Total time spent programming was 10 minutes.  Device was confirmed to be on.  Soft start was confirmed to be on.  Impedences were checked and were within normal limits.  Battery was checked and was 88%.    Final settings were as follows with Group B active:   Active Contact Amplitude  PW (ms) Frequency (hz) Side Effects Battery  Left Brain        09/08/19 1-2+ 4.4V 90 170  2.59  10/08/19             Group A 1-2+ 4.22mA 90 170         Group B 0-1-2-C+ 1.1 80 125     0- (1.1)1-(1.0)2-(0.6)       01/19/20             Group B 0-1-2-C+ 1.1 80 125  4 yrs 9 months  05/27/20 Group B 0-1-2-C+ 1.3 80 125  3 years, 1 month  11/30/20 0-1-2-C+ 1.3 80 125  2 years, 5 months                          Right Brain        09/08/19 4-7+ 4.5V 90 170    10/08/19             Group A 4-7+ 2.79mA 90 170         Group B 4-7-C+ 2.5 60 125     4-(2.5)7-(0.6)C+       01/19/20             Group B 4-7-C+ 2.8 60 165    05/27/20 group B 4-7-C+ 2.8 60 165    11/30/20 4-7-C+ 2.8 60 165

## 2020-12-01 DIAGNOSIS — I130 Hypertensive heart and chronic kidney disease with heart failure and stage 1 through stage 4 chronic kidney disease, or unspecified chronic kidney disease: Secondary | ICD-10-CM | POA: Diagnosis not present

## 2020-12-01 DIAGNOSIS — N183 Chronic kidney disease, stage 3 unspecified: Secondary | ICD-10-CM | POA: Diagnosis not present

## 2020-12-01 DIAGNOSIS — I5042 Chronic combined systolic (congestive) and diastolic (congestive) heart failure: Secondary | ICD-10-CM | POA: Diagnosis not present

## 2020-12-01 DIAGNOSIS — E1122 Type 2 diabetes mellitus with diabetic chronic kidney disease: Secondary | ICD-10-CM | POA: Diagnosis not present

## 2020-12-01 DIAGNOSIS — G20 Parkinson's disease: Secondary | ICD-10-CM | POA: Diagnosis not present

## 2020-12-01 DIAGNOSIS — E1140 Type 2 diabetes mellitus with diabetic neuropathy, unspecified: Secondary | ICD-10-CM | POA: Diagnosis not present

## 2020-12-02 DIAGNOSIS — E1140 Type 2 diabetes mellitus with diabetic neuropathy, unspecified: Secondary | ICD-10-CM | POA: Diagnosis not present

## 2020-12-02 DIAGNOSIS — G3184 Mild cognitive impairment, so stated: Secondary | ICD-10-CM | POA: Diagnosis not present

## 2020-12-02 DIAGNOSIS — Z951 Presence of aortocoronary bypass graft: Secondary | ICD-10-CM | POA: Diagnosis not present

## 2020-12-02 DIAGNOSIS — I429 Cardiomyopathy, unspecified: Secondary | ICD-10-CM | POA: Diagnosis not present

## 2020-12-02 DIAGNOSIS — J45909 Unspecified asthma, uncomplicated: Secondary | ICD-10-CM | POA: Diagnosis not present

## 2020-12-02 DIAGNOSIS — I2510 Atherosclerotic heart disease of native coronary artery without angina pectoris: Secondary | ICD-10-CM | POA: Diagnosis not present

## 2020-12-02 DIAGNOSIS — I69891 Dysphagia following other cerebrovascular disease: Secondary | ICD-10-CM | POA: Diagnosis not present

## 2020-12-02 DIAGNOSIS — I69820 Aphasia following other cerebrovascular disease: Secondary | ICD-10-CM | POA: Diagnosis not present

## 2020-12-02 DIAGNOSIS — E785 Hyperlipidemia, unspecified: Secondary | ICD-10-CM | POA: Diagnosis not present

## 2020-12-02 DIAGNOSIS — Z7985 Long-term (current) use of injectable non-insulin antidiabetic drugs: Secondary | ICD-10-CM | POA: Diagnosis not present

## 2020-12-02 DIAGNOSIS — Z9181 History of falling: Secondary | ICD-10-CM | POA: Diagnosis not present

## 2020-12-02 DIAGNOSIS — I130 Hypertensive heart and chronic kidney disease with heart failure and stage 1 through stage 4 chronic kidney disease, or unspecified chronic kidney disease: Secondary | ICD-10-CM | POA: Diagnosis not present

## 2020-12-02 DIAGNOSIS — E1122 Type 2 diabetes mellitus with diabetic chronic kidney disease: Secondary | ICD-10-CM | POA: Diagnosis not present

## 2020-12-02 DIAGNOSIS — R350 Frequency of micturition: Secondary | ICD-10-CM | POA: Diagnosis not present

## 2020-12-02 DIAGNOSIS — Z794 Long term (current) use of insulin: Secondary | ICD-10-CM | POA: Diagnosis not present

## 2020-12-02 DIAGNOSIS — I252 Old myocardial infarction: Secondary | ICD-10-CM | POA: Diagnosis not present

## 2020-12-02 DIAGNOSIS — Z95810 Presence of automatic (implantable) cardiac defibrillator: Secondary | ICD-10-CM | POA: Diagnosis not present

## 2020-12-02 DIAGNOSIS — G20 Parkinson's disease: Secondary | ICD-10-CM | POA: Diagnosis not present

## 2020-12-02 DIAGNOSIS — N183 Chronic kidney disease, stage 3 unspecified: Secondary | ICD-10-CM | POA: Diagnosis not present

## 2020-12-02 DIAGNOSIS — I5042 Chronic combined systolic (congestive) and diastolic (congestive) heart failure: Secondary | ICD-10-CM | POA: Diagnosis not present

## 2020-12-08 DIAGNOSIS — G20 Parkinson's disease: Secondary | ICD-10-CM | POA: Diagnosis not present

## 2020-12-08 DIAGNOSIS — I130 Hypertensive heart and chronic kidney disease with heart failure and stage 1 through stage 4 chronic kidney disease, or unspecified chronic kidney disease: Secondary | ICD-10-CM | POA: Diagnosis not present

## 2020-12-08 DIAGNOSIS — I5042 Chronic combined systolic (congestive) and diastolic (congestive) heart failure: Secondary | ICD-10-CM | POA: Diagnosis not present

## 2020-12-08 DIAGNOSIS — E1140 Type 2 diabetes mellitus with diabetic neuropathy, unspecified: Secondary | ICD-10-CM | POA: Diagnosis not present

## 2020-12-08 DIAGNOSIS — E1122 Type 2 diabetes mellitus with diabetic chronic kidney disease: Secondary | ICD-10-CM | POA: Diagnosis not present

## 2020-12-08 DIAGNOSIS — N183 Chronic kidney disease, stage 3 unspecified: Secondary | ICD-10-CM | POA: Diagnosis not present

## 2020-12-10 DIAGNOSIS — N183 Chronic kidney disease, stage 3 unspecified: Secondary | ICD-10-CM | POA: Diagnosis not present

## 2020-12-10 DIAGNOSIS — G20 Parkinson's disease: Secondary | ICD-10-CM | POA: Diagnosis not present

## 2020-12-10 DIAGNOSIS — E1140 Type 2 diabetes mellitus with diabetic neuropathy, unspecified: Secondary | ICD-10-CM | POA: Diagnosis not present

## 2020-12-10 DIAGNOSIS — I130 Hypertensive heart and chronic kidney disease with heart failure and stage 1 through stage 4 chronic kidney disease, or unspecified chronic kidney disease: Secondary | ICD-10-CM | POA: Diagnosis not present

## 2020-12-10 DIAGNOSIS — E1122 Type 2 diabetes mellitus with diabetic chronic kidney disease: Secondary | ICD-10-CM | POA: Diagnosis not present

## 2020-12-10 DIAGNOSIS — I5042 Chronic combined systolic (congestive) and diastolic (congestive) heart failure: Secondary | ICD-10-CM | POA: Diagnosis not present

## 2020-12-15 ENCOUNTER — Other Ambulatory Visit: Payer: Self-pay | Admitting: Family Medicine

## 2020-12-17 DIAGNOSIS — E1122 Type 2 diabetes mellitus with diabetic chronic kidney disease: Secondary | ICD-10-CM | POA: Diagnosis not present

## 2020-12-17 DIAGNOSIS — E1140 Type 2 diabetes mellitus with diabetic neuropathy, unspecified: Secondary | ICD-10-CM | POA: Diagnosis not present

## 2020-12-17 DIAGNOSIS — I130 Hypertensive heart and chronic kidney disease with heart failure and stage 1 through stage 4 chronic kidney disease, or unspecified chronic kidney disease: Secondary | ICD-10-CM | POA: Diagnosis not present

## 2020-12-17 DIAGNOSIS — N183 Chronic kidney disease, stage 3 unspecified: Secondary | ICD-10-CM | POA: Diagnosis not present

## 2020-12-17 DIAGNOSIS — I5042 Chronic combined systolic (congestive) and diastolic (congestive) heart failure: Secondary | ICD-10-CM | POA: Diagnosis not present

## 2020-12-17 DIAGNOSIS — G20 Parkinson's disease: Secondary | ICD-10-CM | POA: Diagnosis not present

## 2020-12-20 NOTE — Progress Notes (Signed)
HPI:FU coronary artery disease status post bypass and graft, Parkinson's, diabetes and hypertension as well as hyperlipidemia. Carotid Dopplers in January 2005 showed 0-39% stenosis. Last nuclear study 6/15 showed EF 41, prior MI; no ischemia. Cardiac catheterization September 2015 showed a pulmonary Wedge pressure of 16 and PA pressure 32/20. The LAD was occluded. No obstructive disease in the circumflex. The right coronary had severe diffuse distal disease. Saphenous vein graft to the PDA was patent. Saphenous vein graft to diagonal patent. LIMA to the mid LAD patent. Medical therapy recommended. Had ICD placed November 2015.  Echocardiogram May 2021 showed ejection fraction 40 to 45% with akinesis of the anteroseptal wall and apex. Patient noted to have atrial fibrillation on device interrogation previously. He is asymptomatic with his episodes. Since last seen, he has some dyspnea unchanged.  No orthopnea or PND.  Minimal pedal edema.  No chest pain or syncope.  Current Outpatient Medications  Medication Sig Dispense Refill   acetaminophen (TYLENOL) 500 MG tablet Take 1,000 mg by mouth at bedtime as needed for mild pain or headache.      albuterol (VENTOLIN HFA) 108 (90 Base) MCG/ACT inhaler Inhale 2 puffs into the lungs every 6 (six) hours as needed for wheezing or shortness of breath. 18 g 5   allopurinol (ZYLOPRIM) 100 MG tablet TAKE 1 & 1/2 TABLET BY MOUTH DAILY 135 tablet 3   aspirin EC 81 MG tablet Take 1 tablet (81 mg total) by mouth daily. Swallow whole. 90 tablet 3   carbidopa-levodopa (SINEMET CR) 50-200 MG tablet TAKE 1 TABLET BY MOUTH EVERYDAY AT BEDTIME 90 tablet 0   Carbidopa-Levodopa ER (SINEMET CR) 25-100 MG tablet controlled release 2 po 5 times per day as directed 900 tablet 1   Cholecalciferol (VITAMIN D) 50 MCG (2000 UT) tablet Take 2,000 Units by mouth daily.      colchicine 0.6 MG tablet Take 1 tablet (0.6 mg total) by mouth daily as needed (gout).      dorzolamide-timolol (COSOPT) 22.3-6.8 MG/ML ophthalmic solution Place 1 drop into the right eye 2 (two) times daily.     fluticasone-salmeterol (ADVAIR DISKUS) 250-50 MCG/ACT AEPB Inhale 1 puff into the lungs in the morning and at bedtime. Rinse after use. 180 each 3   FREESTYLE LITE test strip USE TO CHECK BLOOD SUGAR 4 TIMES DAILY. 150 strip 3   furosemide (LASIX) 80 MG tablet TAKE 1 TABLET BY MOUTH EVERY DAY 90 tablet 1   gabapentin (NEURONTIN) 100 MG capsule TAKE 2 CAPSULES BY MOUTH EVERY MORNING AND TAKE 4 CAPSULES BY MOUTH EVERY EVENING 540 capsule 1   HUMALOG 100 UNIT/ML injection USE MAXIMUM 76 UNITS PER   DAY WITH V-GO PUMP 60 mL 3   Insulin Disposable Pump (OMNIPOD DASH SYSTEM) KIT 1 each by Does not apply route. Use Dash system for Continuous Blood Glucose Monitoring.     levothyroxine (SYNTHROID) 137 MCG tablet TAKE 1 TABLET BY MOUTH DAILY BEFORE BREAKFAST. 90 tablet 1   metoprolol succinate (TOPROL-XL) 50 MG 24 hr tablet TAKE 1 TABLET BY MOUTH DAILY. TAKE WITH OR IMMEDIATELY FOLLOWING A MEAL. 90 tablet 1   MYRBETRIQ 25 MG TB24 tablet TAKE 1 TABLET BY MOUTH EVERY DAY 30 tablet 5   omega-3 acid ethyl esters (LOVAZA) 1 g capsule TAKE 2 CAPSULES BY MOUTH TWICE A DAY 360 capsule 1   OVER THE COUNTER MEDICATION Apply 1 application topically daily as needed (pain). Theraworx Pain Cream     OZEMPIC, 1 MG/DOSE,  4 MG/3ML SOPN INJECT 1MG SUBCUTANEOUSLY  ONCE A WEEK. 9 mL 3   polyethylene glycol (MIRALAX / GLYCOLAX) packet Take 8.5 g by mouth daily.     potassium chloride SA (KLOR-CON) 20 MEQ tablet TAKE 3 TABLETS (60 MEQ TOTAL) BY MOUTH DAILY. 270 tablet 1   Propylene Glycol (SYSTANE BALANCE OP) Place 1 drop into the left eye daily as needed (dry eyes). Uses Systane gel apply to the eye as needed     rosuvastatin (CRESTOR) 20 MG tablet TAKE 1 TABLET BY MOUTH EVERYDAY AT BEDTIME 90 tablet 1   traMADol (ULTRAM) 50 MG tablet TAKE 1 TABLET BY MOUTH TWICE A DAY 60 tablet 2   vitamin B-12  (CYANOCOBALAMIN) 1000 MCG tablet Take 1,000 mcg by mouth daily.     nitroGLYCERIN (NITROSTAT) 0.4 MG SL tablet Place 1 tablet (0.4 mg total) under the tongue every 5 (five) minutes as needed for chest pain. 25 tablet 12   No current facility-administered medications for this visit.     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   Dysrhythmia    a-fib   GERD (gastroesophageal reflux disease)    Glaucoma    right eye   History of kidney stones    multiple   HLD (hyperlipidemia)    HTN (hypertension)    pt denies 08/19/12   Hyperplasia, prostate    Hypothyroidism    MI (myocardial infarction) (HCC)    Dr  2000, x3vessels bypass   OSA (obstructive sleep apnea)    AHI-28,on CPAP, noncompliant with CPAP   Parkinson disease (HCC)    1999   PONV (postoperative nausea and vomiting)    Restless legs    Shortness of breath    Hx: of at all times   UTI (lower urinary tract infection) 09/15/12   Klebsiella   Ventral hernia    Walker as ambulation aid    also uses wheelchair at home/when going out    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   BIOPSY  10/14/2018   Procedure: BIOPSY;  Surgeon: Pyrtle, Jay M, MD;  Location: [facility];  Service: Gastroenterology;;   CARDIAC CATHETERIZATION     CATARACT EXTRACTION W/ INTRAOCULAR LENS IMPLANT     Hx: of right eye   CATARACT EXTRACTION W/ INTRAOCULAR LENS IMPLANT Left 2018   COLONOSCOPY N/A 10/13/2014   Procedure: COLONOSCOPY;  Surgeon: Jay M Pyrtle, MD;  Location: [facility];  Service: Gastroenterology;  Laterality: N/A;   COLONOSCOPY W/ BIOPSIES AND POLYPECTOMY     Hx: of   COLONOSCOPY WITH PROPOFOL N/A 10/14/2018   Procedure: COLONOSCOPY WITH PROPOFOL;  Surgeon: Pyrtle, Jay M, MD;  Location: [facility];  Service: Gastroenterology;  Laterality: N/A;   CORONARY ARTERY BYPASS GRAFT  2000   Clarence Owen, MD   CORONARY STENT PLACEMENT  1998   DEEP BRAIN STIMULATOR PLACEMENT  2004     Right and left VIN stimulator placement (parkinsons)   EYE SURGERY     FINGER AMPUTATION     left pointer   IMPLANTABLE CARDIOVERTER DEFIBRILLATOR IMPLANT N/A 11/13/2013   Procedure: IMPLANTABLE CARDIOVERTER DEFIBRILLATOR IMPLANT;  Surgeon: Evans Lance, MD;  Location: [facility] CATH LAB;  Service: Cardiovascular;  Laterality: N/A;   INSERT / REPLACE / REMOVE PACEMAKER     medtronic   LEFT AND RIGHT HEART CATHETERIZATION WITH CORONARY ANGIOGRAM N/A 09/24/2013   Procedure: LEFT AND RIGHT HEART CATHETERIZATION WITH CORONARY ANGIOGRAM;  Surgeon: Burnell Blanks, MD;  Location: [facility] CATH LAB;  Service: Cardiovascular;  Laterality: N/A;   LITHOTRIPSY     3 different times   MEDIAN STERNOTOMY  2000   POLYPECTOMY  10/14/2018   Procedure: POLYPECTOMY;  Surgeon: Jerene Bears, MD;  Location: [facility];  Service: Gastroenterology;;   PULSE GENERATOR IMPLANT Right 11/13/2017   Procedure: Right chest implantable pulse generator change;  Surgeon: Erline Levine, MD;  Location: [place];  Service: Neurosurgery;  Laterality: Right;  Right chest implantable pulse generator change   SUBTHALAMIC STIMULATOR BATTERY REPLACEMENT N/A 09/05/2012   Procedure: Deep brain stimulator battery change;  Surgeon: Erline Levine, MD;  Location: [place] [facility];  Service: Neurosurgery;  Laterality: N/A;  Deep brain stimulator battery change   SUBTHALAMIC STIMULATOR BATTERY REPLACEMENT N/A 06/10/2015   Procedure: Deep Brain stimulator battery change;  Surgeon: Erline Levine, MD;  Location: [place] [facility];  Service:  Neurosurgery;  Laterality: N/A;   SUBTHALAMIC STIMULATOR BATTERY REPLACEMENT Right 09/25/2019   Procedure: Deep brain stimulator battery change;  Surgeon: Erline Levine, MD;  Location: [place];  Service: Neurosurgery;  Laterality: Right;   TONSILLECTOMY      Social History   Socioeconomic History   Marital status: Married    Spouse name: [name]   Number of children: 2   Years of education: 12   Highest education level: High school graduate  Occupational History   Occupation: DISABLED    Comment: [employer], CABINET MA[employer]  Tobacco Use   Smoking status: Never   Smokeless tobacco: Never  Vaping Use   Vaping Use: Never used  Substance and Sexual Activity   Alcohol use: Not Currently   Drug use: No   Sexual activity: Not Currently  Other Topics Concern   Not on file  Social History Narrative   From [place]   Retired/disability Clinical research associate   Likes to fish.     Married 1972   3 kids   [place] fan   Social Determinants of Radio broadcast assistant Strain: Low Risk    Difficulty of Paying Living Expenses: Not hard at all  Food Insecurity: No Food Insecurity   Worried About Charity fundraiser in the Last Year: Never true   Arboriculturist in the Last Year: Never true  Transportation Needs: No Transportation Needs   Lack of Transportation (Medical): No   Lack of Transportation (Non-Medical): No  Physical Activity: Insufficiently Active   Days of Exercise per Week: 3 days   Minutes of Exercise per Session: 10 min  Stress: No Stress Concern Present   Feeling of Stress : Not at all  Social Connections: Socially Integrated   Frequency of Communication with Friends and Family: Twice a week   Frequency of Social Gatherings with Friends and Family: More than three times a week   Attends Religious Services: More than 4 times per year   Active Member of [employer] or Organizations: Yes  Attends Music therapist: More than 4 times per year   Marital Status:  Married  Human resources officer Violence: Not At Risk   Fear of Current or Ex-Partner: No   Emotionally Abused: No   Physically Abused: No   Sexually Abused: No    Family History  Problem Relation Age of Onset   Aneurysm Mother    Alcoholism Father    HIV/AIDS Brother 90       AIDS   Healthy Sister    Healthy Child    Peripheral vascular disease Other    Arthritis Other    Healthy Sister    Healthy Child    Healthy Child    Diabetes Neg Hx    Heart disease Neg Hx    Colon cancer Neg Hx    Prostate cancer Neg Hx     ROS: Some fatigue after physical therapy but no fevers or chills, productive cough, hemoptysis, dysphasia, odynophagia, melena, hematochezia, dysuria, hematuria, rash, seizure activity, orthopnea, PND, claudication. Remaining systems are negative.  Physical Exam: Well-developed well-nourished in no acute distress.  Skin is warm and dry.  HEENT is normal.  Neck is supple.  Chest is clear to auscultation with normal expansion.  Cardiovascular exam is regular rate and rhythm.  Abdominal exam nontender or distended. No masses palpated. Extremities show trace edema. neuro consistent with Parkinson's.  A/P  1 coronary artery disease-patient denies chest pain.  Continue medical therapy with aspirin and statin.  2 paroxysmal atrial fibrillation-we will continue beta-blocker for rate control if atrial fibrillation recurs.  Patient has frequent falls due to his Parkinson's.  I therefore feel the risk of anticoagulation outweighs the benefit.  3 hypertension-blood pressure controlled.  Continue present medications.  4 hyperlipidemia-continue statin.  5 ischemic cardiomyopathy-we will continue beta-blocker.  He has had difficulties with orthostasis in the past due to autonomic dysfunction from Parkinson's.  I will try low-dose ARB (losartan 25 mg daily) to see if he tolerates.  Check potassium and renal function in 1 week.  6 ICD-followed by electrophysiology.  7  chronic combined systolic/diastolic congestive heart failure-he is euvolemic.  Continue diuretic at present dose.  He did not tolerate [place] previously.  Kirk Ruths, MD

## 2020-12-21 NOTE — Progress Notes (Signed)
Subjective:   Henry [name] is a 70 y.o. male who presents for Medicare Annual/Subsequent preventive examination.  I connected with Henry [name] today by telephone and verified that I am speaking with the correct person using two identifiers. Location patient: home Location provider: work Persons participating in the virtual visit: patient, Engineer, civil (consulting).    I discussed the limitations, risks, security and privacy concerns of performing an evaluation and management service by telephone and the availability of in person appointments. I also discussed with the patient that there may be a patient responsible charge related to this service. The patient expressed understanding and verbally consented to this telephonic visit.    Interactive audio and video telecommunications were attempted between this provider and patient, however failed, due to patient having technical difficulties OR patient did not have access to video capability.  We continued and completed visit with audio only.  Some vital signs may be absent or patient reported.   Time Spent with patient on telephone encounter: 25 minutes  Review of Systems     Cardiac Risk Factors include: advanced age (>1men, >44 women);hypertension;diabetes mellitus;dyslipidemia     Objective:    Today's Vitals   12/23/20 1114  Weight: 260 lb (117.9 kg)  Height: 5\' 9"  (1.753 m)   Body mass index is 38.4 kg/m.  Advanced Directives 12/23/2020 11/30/2020 07/20/2020 01/19/2020 10/08/2019 09/08/2019 03/20/2019  Does Patient Have a Medical Advance Directive? Yes Yes No Yes Yes Yes Yes  Type of 05/20/2019 of [place];Living will - - Healthcare Power of [place];Living will Healthcare Power of [place];Living will Healthcare Power of [place];Living will Healthcare Power of [place];Living will  Does patient want to make changes to medical advance directive? Yes (MAU/Ambulatory/Procedural Areas - Information given) - - - - - -   Copy of Healthcare Power of Attorney in Chart? - - - - - - -  Would patient like information on creating a medical advance directive? - - No - Patient declined - - - -  Pre-existing out of facility DNR order (yellow form or pink MOST form) - - - - - - -    Current Medications (verified) Outpatient Encounter Medications as of 12/23/2020  Medication Sig   acetaminophen (TYLENOL) 500 MG tablet Take 1,000 mg by mouth at bedtime as needed for mild pain or headache.    albuterol (VENTOLIN HFA) 108 (90 Base) MCG/ACT inhaler Inhale 2 puffs into the lungs every 6 (six) hours as needed for wheezing or shortness of breath.   allopurinol (ZYLOPRIM) 100 MG tablet TAKE 1 & 1/2 TABLET BY MOUTH DAILY   aspirin EC 81 MG tablet Take 1 tablet (81 mg total) by mouth daily. Swallow whole.   carbidopa-levodopa (SINEMET CR) 50-200 MG tablet TAKE 1 TABLET BY MOUTH EVERYDAY AT BEDTIME   Carbidopa-Levodopa ER (SINEMET CR) 25-100 MG tablet controlled release 2 po 5 times per day as directed   Cholecalciferol (VITAMIN D) 50 MCG (2000 UT) tablet Take 2,000 Units by mouth daily.    colchicine 0.6 MG tablet Take 1 tablet (0.6 mg total) by mouth daily as needed (gout).   dorzolamide-timolol (COSOPT) 22.3-6.8 MG/ML ophthalmic solution Place 1 drop into the right eye 2 (two) times daily.   fluticasone-salmeterol (ADVAIR DISKUS) 250-50 MCG/ACT AEPB Inhale 1 puff into the lungs in the morning and at bedtime. Rinse after use.   FREESTYLE LITE test strip USE TO CHECK BLOOD SUGAR 4 TIMES DAILY.   furosemide (LASIX) 80 MG tablet TAKE 1 TABLET BY MOUTH  EVERY DAY   gabapentin (NEURONTIN) 100 MG capsule TAKE 2 CAPSULES BY MOUTH EVERY MORNING AND TAKE 4 CAPSULES BY MOUTH EVERY EVENING   HUMALOG 100 UNIT/ML injection USE MAXIMUM 76 UNITS PER   DAY WITH V-GO PUMP   Insulin Disposable Pump (OMNIPOD DASH SYSTEM) KIT 1 each by Does not apply route. Use Dash system for Continuous Blood Glucose Monitoring.   levothyroxine (SYNTHROID) 137 MCG  tablet TAKE 1 TABLET BY MOUTH DAILY BEFORE BREAKFAST.   metoprolol succinate (TOPROL-XL) 50 MG 24 hr tablet TAKE 1 TABLET BY MOUTH DAILY. TAKE WITH OR IMMEDIATELY FOLLOWING A MEAL.   MYRBETRIQ 25 MG TB24 tablet TAKE 1 TABLET BY MOUTH EVERY DAY   omega-3 acid ethyl esters (LOVAZA) 1 g capsule TAKE 2 CAPSULES BY MOUTH TWICE A DAY   OVER THE COUNTER MEDICATION Apply 1 application topically daily as needed (pain). Theraworx Pain Cream   OZEMPIC, 1 MG/DOSE, 4 MG/3ML SOPN INJECT $RemoveBef'1MG'qOySlRadmZ$  SUBCUTANEOUSLY  ONCE A WEEK.   polyethylene glycol (MIRALAX / GLYCOLAX) packet Take 8.5 g by mouth daily.   potassium chloride SA (KLOR-CON) 20 MEQ tablet TAKE 3 TABLETS (60 MEQ TOTAL) BY MOUTH DAILY.   Propylene Glycol (SYSTANE BALANCE OP) Place 1 drop into the left eye daily as needed (dry eyes). Uses Systane gel apply to the eye as needed   rosuvastatin (CRESTOR) 20 MG tablet TAKE 1 TABLET BY MOUTH EVERYDAY AT BEDTIME   traMADol (ULTRAM) 50 MG tablet TAKE 1 TABLET BY MOUTH TWICE A DAY   vitamin B-12 (CYANOCOBALAMIN) 1000 MCG tablet Take 1,000 mcg by mouth daily.   nitroGLYCERIN (NITROSTAT) 0.4 MG SL tablet Place 1 tablet (0.4 mg total) under the tongue every 5 (five) minutes as needed for chest pain.   No facility-administered encounter medications on file as of 12/23/2020.    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)   DM (diabetes mellitus) ([place])    TYPE 2 - insulin pump   Dysrhythmia    a-fib   GERD (gastroesophageal reflux  disease)    Glaucoma    right eye   History of kidney stones    multiple   HLD (hyperlipidemia)    HTN (hypertension)    pt denies 08/19/12   Hyperplasia, prostate    Hypothyroidism    MI (myocardial infarction) ([place])    Dr Crenshaw 2000, x3vessels bypass   OSA (obstructive sleep apnea)    AHI-28,on CPAP, noncompliant with CPAP   Parkinson disease ([place])    1999   PONV (postoperative nausea and vomiting)    Restless legs    Shortness of breath    Hx: of at all times   UTI (lower urinary tract infection) 09/15/12   Klebsiella   Ventral hernia    Walker as ambulation aid    also uses wheelchair at home/when going out   Past Surgical History:  Procedure Laterality Date   [place]   right total loss  BIOPSY  10/14/2018   Procedure: BIOPSY;  Surgeon: Beverley Fiedler, MD;  Location: [facility];  Service: Gastroenterology;;   CARDIAC CATHETERIZATION     CATARACT EXTRACTION W/ INTRAOCULAR LENS IMPLANT     Hx: of right eye   CATARACT EXTRACTION W/ INTRAOCULAR LENS IMPLANT Left 2018   COLONOSCOPY N/A 10/13/2014   Procedure: COLONOSCOPY;  Surgeon: Beverley Fiedler, MD;  Location: [facility];  Service: Gastroenterology;  Laterality: N/A;   COLONOSCOPY W/ BIOPSIES AND POLYPECTOMY     Hx: of   COLONOSCOPY WITH PROPOFOL N/A 10/14/2018   Procedure: COLONOSCOPY WITH PROPOFOL;  Surgeon: Beverley Fiedler, MD;  Location: [facility];  Service: Gastroenterology;  Laterality: N/A;   CORONARY ARTERY BYPASS GRAFT  2000   Tressie Stalker, MD   CORONARY STENT PLACEMENT  1998   DEEP BRAIN STIMULATOR PLACEMENT  2004   Right and left VIN stimulator placement (parkinsons)   EYE SURGERY     FINGER AMPUTATION     left pointer   IMPLANTABLE CARDIOVERTER DEFIBRILLATOR IMPLANT N/A 11/13/2013   Procedure: IMPLANTABLE CARDIOVERTER DEFIBRILLATOR IMPLANT;  Surgeon: Marinus Maw, MD;  Location: [facility] CATH LAB;  Service: Cardiovascular;  Laterality: N/A;   INSERT / REPLACE / REMOVE PACEMAKER      medtronic   LEFT AND RIGHT HEART CATHETERIZATION WITH CORONARY ANGIOGRAM N/A 09/24/2013   Procedure: LEFT AND RIGHT HEART CATHETERIZATION WITH CORONARY ANGIOGRAM;  Surgeon: Kathleene Hazel, MD;  Location: [facility] LAB;  Service: Cardiovascular;  Laterality: N/A;   LITHOTRIPSY     3 different times   MEDIAN STERNOTOMY  2000   POLYPECTOMY  10/14/2018   Procedure: POLYPECTOMY;  Surgeon: Beverley Fiedler, MD;  Location: [facility];  Service: Gastroenterology;;   PULSE GENERATOR IMPLANT Right 11/13/2017   Procedure: Right chest implantable pulse generator change;  Surgeon: Maeola Harman, MD;  Location: [facility] OR;  Service: Neurosurgery;  Laterality: Right;  Right chest implantable pulse generator change   SUBTHALAMIC STIMULATOR BATTERY REPLACEMENT N/A 09/05/2012   Procedure: Deep brain stimulator battery change;  Surgeon: Maeola Harman, MD;  Location: [facility];  Service: Neurosurgery;  Laterality: N/A;  Deep brain stimulator battery change   SUBTHALAMIC STIMULATOR BATTERY REPLACEMENT N/A 06/10/2015   Procedure: Deep Brain stimulator battery change;  Surgeon: Maeola Harman, MD;  Location: [facility];  Service: Neurosurgery;  Laterality: N/A;   SUBTHALAMIC STIMULATOR BATTERY REPLACEMENT Right 09/25/2019   Procedure: Deep brain stimulator battery change;  Surgeon: Maeola Harman, MD;  Location: [facility] OR;  Service: Neurosurgery;  Laterality: Right;   TONSILLECTOMY     Family History  Problem Relation Age of Onset   Aneurysm Mother    Alcoholism Father    HIV/AIDS Brother 13       AIDS   Healthy Sister    Healthy Child    Peripheral vascular disease Other    Arthritis Other    Healthy Sister    Healthy Child    Healthy Child    Diabetes Neg Hx    Heart disease Neg Hx    Colon cancer Neg Hx    Prostate cancer Neg Hx    Social History   Socioeconomic History   Marital status: Married    Spouse name: [name]   Number of children: 2   Years of education: 12   Highest education level: High  school graduate  Occupational History   Occupation: DISABLED    Comment: [employer], [employer]  Tobacco Use   Smoking status: Never  Smokeless tobacco: Never  Vaping Use   Vaping Use: Never used  Substance and Sexual Activity   Alcohol use: Not Currently   Drug use: No   Sexual activity: Not Currently  Other Topics Concern   Not on file  Social History Narrative   From [facility]   Retired/disability Clinical research associate   Likes to fish.     Married 1972   3 kids   [place] fan   Social Determinants of Radio broadcast assistant Strain: Low Risk    Difficulty of Paying Living Expenses: Not hard at all  Food Insecurity: No Food Insecurity   Worried About Charity fundraiser in the Last Year: Never true   Arboriculturist in the Last Year: Never true  Transportation Needs: No Transportation Needs   Lack of Transportation (Medical): No   Lack of Transportation (Non-Medical): No  Physical Activity: Insufficiently Active   Days of Exercise per Week: 3 days   Minutes of Exercise per Session: 10 min  Stress: No Stress Concern Present   Feeling of Stress : Not at all  Social Connections: Socially Integrated   Frequency of Communication with Friends and Family: Twice a week   Frequency of Social Gatherings with Friends and Family: More than three times a week   Attends Religious Services: More than 4 times per year   Active Member of [employer] or Organizations: Yes   Attends Music therapist: More than 4 times per year   Marital Status: Married    Tobacco Counseling Counseling given: Not Answered   Clinical Intake:  Pre-visit preparation completed: Yes  Pain : No/denies pain     BMI - recorded: 38.4 Nutritional Status: BMI > 30  Obese Nutritional Risks: None Diabetes: Yes CBG done?: No (visit completed over the phone) Did pt. bring in CBG monitor from home?: No  How often do you need to have someone help you when you read instructions,  pamphlets, or other written materials from your doctor or pharmacy?: 1 - Never  Diabetes:  Is the patient diabetic?  Yes  If diabetic, was a CBG obtained today?  No  , visit completed over the phone.  Did the patient bring in their glucometer from home?  No , visit completed over the phone.  How often do you monitor your CBG's? 2-3 times per day.   Financial Strains and Diabetes Management:  Are you having any financial strains with the device, your supplies or your medication? No .  Does the patient want to be seen by Chronic Care Management for management of their diabetes?  No  Would the patient like to be referred to a Nutritionist or for Diabetic Management?  No   Diabetic Exams:  Diabetic Eye Exam: Completed 08/27/20.   Diabetic Foot Exam: Completed 10/07/20.   Interpreter Needed?: No  Information entered by :: [name] LPN   Activities of Daily Living In your present state of health, do you have any difficulty performing the following activities: 12/23/2020  Hearing? Y  Comment wears hearing aid  Vision? N  Difficulty concentrating or making decisions? N  Walking or climbing stairs? Y  Comment use walker  Dressing or bathing? Y  Comment wife assist  Doing errands, shopping? Y  Comment wife [place] and eating ? Y  Comment wife assist  Using the Toilet? Y  Comment wife assist  In the past six months, have you accidently leaked urine? N  Do you  have problems with loss of bowel control? Y  Comment wife assist  Managing your Medications? Y  Comment wife assist  Managing your Finances? N  Housekeeping or managing your Housekeeping? Y  Comment wife assist  Some recent data might be hidden    Patient Care Team: Tonia Ghent, MD as PCP - General (Family Medicine) Stanford Breed Denice Bors, MD as PCP - Cardiology (Cardiology) Kidney, [place], DO as Consulting Physician (Neurology) Charlton Haws, [facility] as Pharmacist  (Pharmacist)  Indicate any recent Medical Services you may have received from other than [facility] providers in the past year (date may be approximate).     Assessment:   This is a routine wellness examination for [facility].  Hearing/Vision screen Hearing Screening - Comments:: Wears hearing aid left ear Vision Screening - Comments:: Last exam 2022, Dr. Herbert Deaner  Dietary issues and exercise activities discussed: Current Exercise Habits: Home exercise routine, Type of exercise: Other - see comments (PT - 1x wk, uses peddler), Time (Minutes): 10 (1hr for PT), Frequency (Times/Week): 3, Weekly Exercise (Minutes/Week): 30, Intensity: Mild, Exercise limited by: neurologic condition(s) (Parkinsons)   Goals Addressed             This Visit's Progress    Patient Stated       Would like to have a better diet       Depression Screen PHQ 2/9 Scores 12/23/2020 08/16/2018 04/13/2017 04/04/2017 09/21/2015 03/12/2014  PHQ - 2 Score 0 0 6 0 2 1  PHQ- 9 Score - 3 - 0 12 -    Fall Risk Fall Risk  12/23/2020 11/30/2020 01/19/2020 10/08/2019 09/08/2019  Falls in the past year? 1 0 $R'1 1 1  'om$ Comment - - - - -  Number falls in past yr: 0 0 0 1 1  Injury with Fall? 1 0 0 0 0  Comment broke nose - - - -  Risk Factor Category  - - - - -  Risk for fall due to : Impaired balance/gait - - - -  Follow up Falls prevention discussed - - - -    FALL RISK PREVENTION PERTAINING TO THE HOME:  Any stairs in or around the home? Yes  If so, are there any without handrails? No  Home free of loose throw rugs in walkways, pet beds, electrical cords, etc? Yes  Adequate lighting in your home to reduce risk of falls? Yes   ASSISTIVE DEVICES UTILIZED TO PREVENT FALLS:  Life alert? No  Use of a cane, walker or w/c? Yes , walker Grab bars in the bathroom? Yes  Shower chair or bench in shower? Yes  Elevated toilet seat or a handicapped toilet? Yes   TIMED UP AND GO:  Was the test performed? No , visit completed over the  phone.    Cognitive Function: Normal cognitive status assessed by  this Nurse Health Advisor. No abnormalities found.   MMSE - Mini Mental State Exam 08/16/2018 04/04/2017  Orientation to time 5 5  Orientation to Place 5 5  Registration 3 3  Attention/ Calculation 5 0  Recall 3 2  Recall-comments - unable to recall 1 of 3 words  Language- name 2 objects 0 0  Language- repeat 1 1  Language- follow 3 step command 0 3  Language- read & follow direction 0 0  Write a sentence 0 0  Copy design 0 0  Total score 22 19        Immunizations Immunization History  Administered Date(s)  Administered   Fluad Quad(high Dose 65+) 09/18/2018, 10/28/2019   H1N1 01/22/2008   Influenza Split 12/22/2010, 11/07/2011   Influenza Whole 10/17/2007, 11/16/2008, 11/09/2009   Influenza,inj,Quad PF,6+ Mos 10/01/2014, 09/21/2015, 10/27/2016   Influenza-Unspecified 10/25/2012, 11/27/2013, 09/09/2017, 10/07/2020   PFIZER(Purple Top)SARS-COV-2 Vaccination 05/06/2019, 05/27/2019   Pneumococcal Conjugate-13 03/25/2015   Pneumococcal Polysaccharide-23 01/09/2010, 04/10/2017   Tdap 12/04/2012, 07/21/2020    TDAP status: Up to date  Flu Vaccine status: Up to date  Pneumococcal vaccine status: Up to date  Covid-19 vaccine status: Information provided on how to obtain vaccines.   Qualifies for Shingles Vaccine? Yes   Zostavax completed No   Shingrix Completed?: No.    Education has been provided regarding the importance of this vaccine. Patient has been advised to call insurance company to determine out of pocket expense if they have not yet received this vaccine. Advised may also receive vaccine at local pharmacy or Health Dept. Verbalized acceptance and understanding.  Screening Tests Health Maintenance  Topic Date Due   Zoster Vaccines- Shingrix (1 of 2) Never done   COVID-19 Vaccine (3 - Booster for Pfizer series) 07/22/2019   URINE MICROALBUMIN  12/18/2020   OPHTHALMOLOGY EXAM  02/25/2021    HEMOGLOBIN A1C  04/03/2021   FOOT EXAM  10/07/2021   COLONOSCOPY (Pts 45-24yrs Insurance coverage will need to be confirmed)  10/13/2021   TETANUS/TDAP  07/22/2030   Pneumonia Vaccine 46+ Years old  Completed   INFLUENZA VACCINE  Completed   Hepatitis C Screening  Completed   HPV VACCINES  Aged Out    Health Maintenance  Health Maintenance Due  Topic Date Due   Zoster Vaccines- Shingrix (1 of 2) Never done   COVID-19 Vaccine (3 - Booster for Pfizer series) 07/22/2019   URINE MICROALBUMIN  12/18/2020    Colorectal cancer screening: Type of screening: Colonoscopy. Completed 10/14/18. Repeat every 3 years  Lung Cancer Screening: (Low Dose CT Chest recommended if Age 28-80 years, 30 pack-year currently smoking OR have quit w/in 15years.) does not qualify.     Additional Screening:  Hepatitis C Screening: does qualify; Completed 06/25/17  Vision Screening: Recommended annual ophthalmology exams for early detection of glaucoma and other disorders of the eye. Is the patient up to date with their annual eye exam?  Yes  Who is the provider or what is the name of the office in which the patient attends annual eye exams? Dr. Herbert Deaner   Dental Screening: Recommended annual dental exams for proper oral hygiene  Community Resource Referral / Chronic Care Management: CRR required this visit?  No   CCM required this visit?  No      Plan:     I have personally reviewed and noted the following in the patient's chart:   Medical and social history Use of alcohol, tobacco or illicit drugs  Current medications and supplements including opioid prescriptions. Patient is not currently taking opioid prescriptions. Functional ability and status Nutritional status Physical activity Advanced directives List of other physicians Hospitalizations, surgeries, and ER visits in previous 12 months Vitals Screenings to include cognitive, depression, and falls Referrals and appointments  In  addition, I have reviewed and discussed with patient certain preventive protocols, quality metrics, and best practice recommendations. A written personalized care plan for preventive services as well as general preventive health recommendations were provided to patient.   Due to this being a telephonic visit, the after visit summary with patients personalized plan was offered to patient via mail or my-chart.  Patient  would like to access on my-chart.  Loma Messing, LPN   80/01/2391   Nurse Health Advisor  Nurse Notes: none

## 2020-12-22 ENCOUNTER — Telehealth: Payer: Self-pay | Admitting: Family Medicine

## 2020-12-22 NOTE — Chronic Care Management (AMB) (Signed)
°  Chronic Care Management   Note  12/22/2020 Name: TORRYN HUDSPETH MRN: [id number] DOB: August 23, 1950  KOBEY SIDES is a 70 y.o. year old male who is a primary care patient of Tonia Ghent, MD. I reached out to [name] by phone today in response to a referral sent by Mr. Mikhai Bienvenue Pfefferle's PCP, Tonia Ghent, MD.   Mr. Bardon was given information about Chronic Care Management services today including:  CCM service includes personalized support from designated clinical staff supervised by his physician, including individualized plan of care and coordination with other care providers 24/7 contact phone numbers for assistance for urgent and routine care needs. Service will only be billed when office clinical staff spend 20 minutes or more in a month to coordinate care. Only one practitioner may furnish and bill the service in a calendar month. The patient may stop CCM services at any time (effective at the end of the month) by phone call to the office staff.   [name] Rodger/SPOUSE verbally agreed to assistance and services provided by embedded care coordination/care management team today.  Follow up plan:   [place] Secretary/administrator

## 2020-12-23 ENCOUNTER — Ambulatory Visit (INDEPENDENT_AMBULATORY_CARE_PROVIDER_SITE_OTHER): Payer: Medicare Other

## 2020-12-23 VITALS — Ht 69.0 in | Wt 260.0 lb

## 2020-12-23 DIAGNOSIS — N183 Chronic kidney disease, stage 3 unspecified: Secondary | ICD-10-CM | POA: Diagnosis not present

## 2020-12-23 DIAGNOSIS — Z0000 Encounter for general adult medical examination without abnormal findings: Secondary | ICD-10-CM

## 2020-12-23 DIAGNOSIS — E1140 Type 2 diabetes mellitus with diabetic neuropathy, unspecified: Secondary | ICD-10-CM | POA: Diagnosis not present

## 2020-12-23 DIAGNOSIS — I130 Hypertensive heart and chronic kidney disease with heart failure and stage 1 through stage 4 chronic kidney disease, or unspecified chronic kidney disease: Secondary | ICD-10-CM | POA: Diagnosis not present

## 2020-12-23 DIAGNOSIS — E1122 Type 2 diabetes mellitus with diabetic chronic kidney disease: Secondary | ICD-10-CM | POA: Diagnosis not present

## 2020-12-23 DIAGNOSIS — I5042 Chronic combined systolic (congestive) and diastolic (congestive) heart failure: Secondary | ICD-10-CM | POA: Diagnosis not present

## 2020-12-23 DIAGNOSIS — G20 Parkinson's disease: Secondary | ICD-10-CM | POA: Diagnosis not present

## 2020-12-23 NOTE — Patient Instructions (Signed)
Henry [name] , Thank you for taking time to complete your Medicare Wellness Visit. I appreciate your ongoing commitment to your health goals. Please review the following plan we discussed and let me know if I can assist you in the future.   Screening recommendations/referrals: Colonoscopy: up to date , completed 10/14/18, due 10/13/21 Recommended yearly ophthalmology/optometry visit for glaucoma screening and checkup Recommended yearly dental visit for hygiene and checkup  Vaccinations: Influenza vaccine: up to date Pneumococcal vaccine: up to date Tdap vaccine: up to date, completed 07/21/20, due 07/22/30 Shingles vaccine: Discuss with your local pharmacy Covid-19:  newest booster available at your local pharmacy  Advanced directives: Please bring a copy of Living Will and/or Healthcare Power of Attorney for your chart.   Conditions/risks identified: see problem list  Next appointment: Follow up in one year for your annual wellness visit.   Preventive Care 69 Years and Older, Male Preventive care refers to lifestyle choices and visits with your health care provider that can promote health and wellness. What does preventive care include? A yearly physical exam. This is also called an annual well check. Dental exams once or twice a year. Routine eye exams. Ask your health care provider how often you should have your eyes checked. Personal lifestyle choices, including: Daily care of your teeth and gums. Regular physical activity. Eating a healthy diet. Avoiding tobacco and drug use. Limiting alcohol use. Practicing safe sex. Taking low doses of aspirin every day. Taking vitamin and mineral supplements as recommended by your health care provider. What happens during an annual well check? The services and screenings done by your health care provider during your annual well check will depend on your age, overall health, lifestyle risk factors, and family history of disease. Counseling   Your health care provider may ask you questions about your: Alcohol use. Tobacco use. Drug use. Emotional well-being. Home and relationship well-being. Sexual activity. Eating habits. History of falls. Memory and ability to understand (cognition). Work and work Statistician. Screening  You may have the following tests or measurements: Height, weight, and BMI. Blood pressure. Lipid and cholesterol levels. These may be checked every 5 years, or more frequently if you are over 69 years old. Skin check. Lung cancer screening. You may have this screening every year starting at age 69 if you have a 30-pack-year history of smoking and currently smoke or have quit within the past 15 years. Fecal occult blood test (FOBT) of the stool. You may have this test every year starting at age 69. Flexible sigmoidoscopy or colonoscopy. You may have a sigmoidoscopy every 5 years or a colonoscopy every 10 years starting at age 69. Prostate cancer screening. Recommendations will vary depending on your family history and other risks. Hepatitis C blood test. Hepatitis B blood test. Sexually transmitted disease (STD) testing. Diabetes screening. This is done by checking your blood sugar (glucose) after you have not eaten for a while (fasting). You may have this done every 1-3 years. Abdominal aortic aneurysm (AAA) screening. You may need this if you are a current or former smoker. Osteoporosis. You may be screened starting at age 35 if you are at high risk. Talk with your health care provider about your test results, treatment options, and if necessary, the need for more tests. Vaccines  Your health care provider may recommend certain vaccines, such as: Influenza vaccine. This is recommended every year. Tetanus, diphtheria, and acellular pertussis (Tdap, Td) vaccine. You may need a Td booster every 10 years. Zoster vaccine.  You may need this after age 58. Pneumococcal 13-valent conjugate (PCV13) vaccine.  One dose is recommended after age 2. Pneumococcal polysaccharide (PPSV23) vaccine. One dose is recommended after age 48. Talk to your health care provider about which screenings and vaccines you need and how often you need them. This information is not intended to replace advice given to you by your health care provider. Make sure you discuss any questions you have with your health care provider. Document Released: 01/22/2015 Document Revised: 09/15/2015 Document Reviewed: 10/27/2014 Elsevier Interactive Patient Education  2017 [place] Prevention in the Home Falls can cause injuries. They can happen to people of all ages. There are many things you can do to make your home safe and to help prevent falls. What can I do on the outside of my home? Regularly fix the edges of walkways and driveways and fix any cracks. Remove anything that might make you trip as you walk through a door, such as a raised step or threshold. Trim any bushes or trees on the path to your home. Use bright outdoor lighting. Clear any walking paths of anything that might make someone trip, such as rocks or tools. Regularly check to see if handrails are loose or broken. Make sure that both sides of any steps have handrails. Any raised decks and porches should have guardrails on the edges. Have any leaves, snow, or ice cleared regularly. Use sand or salt on walking paths during winter. Clean up any spills in your garage right away. This includes oil or grease spills. What can I do in the bathroom? Use night lights. Install grab bars by the toilet and in the tub and shower. Do not use towel bars as grab bars. Use non-skid mats or decals in the tub or shower. If you need to sit down in the shower, use a plastic, non-slip stool. Keep the floor dry. Clean up any water that spills on the floor as soon as it happens. Remove soap buildup in the tub or shower regularly. Attach bath mats securely with double-sided  non-slip rug tape. Do not have throw rugs and other things on the floor that can make you trip. What can I do in the bedroom? Use night lights. Make sure that you have a light by your bed that is easy to reach. Do not use any sheets or blankets that are too big for your bed. They should not hang down onto the floor. Have a firm chair that has side arms. You can use this for support while you get dressed. Do not have throw rugs and other things on the floor that can make you trip. What can I do in the kitchen? Clean up any spills right away. Avoid walking on wet floors. Keep items that you use a lot in easy-to-reach places. If you need to reach something above you, use a strong step stool that has a grab bar. Keep electrical cords out of the way. Do not use floor polish or wax that makes floors slippery. If you must use wax, use non-skid floor wax. Do not have throw rugs and other things on the floor that can make you trip. What can I do with my stairs? Do not leave any items on the stairs. Make sure that there are handrails on both sides of the stairs and use them. Fix handrails that are broken or loose. Make sure that handrails are as long as the stairways. Check any carpeting to make sure that it is  firmly attached to the stairs. Fix any carpet that is loose or worn. Avoid having throw rugs at the top or bottom of the stairs. If you do have throw rugs, attach them to the floor with carpet tape. Make sure that you have a light switch at the top of the stairs and the bottom of the stairs. If you do not have them, ask someone to add them for you. What else can I do to help prevent falls? Wear shoes that: Do not have high heels. Have rubber bottoms. Are comfortable and fit you well. Are closed at the toe. Do not wear sandals. If you use a stepladder: Make sure that it is fully opened. Do not climb a closed stepladder. Make sure that both sides of the stepladder are locked into place. Ask  someone to hold it for you, if possible. Clearly mark and make sure that you can see: Any grab bars or handrails. First and last steps. Where the edge of each step is. Use tools that help you move around (mobility aids) if they are needed. These include: Canes. Walkers. Scooters. Crutches. Turn on the lights when you go into a dark area. Replace any light bulbs as soon as they burn out. Set up your furniture so you have a clear path. Avoid moving your furniture around. If any of your floors are uneven, fix them. If there are any pets around you, be aware of where they are. Review your medicines with your doctor. Some medicines can make you feel dizzy. This can increase your chance of falling. Ask your doctor what other things that you can do to help prevent falls. This information is not intended to replace advice given to you by your health care provider. Make sure you discuss any questions you have with your health care provider. Document Released: 10/22/2008 Document Revised: 06/03/2015 Document Reviewed: 01/30/2014 Elsevier Interactive Patient Education  2017 [employer].

## 2020-12-24 ENCOUNTER — Encounter: Payer: Self-pay | Admitting: Cardiology

## 2020-12-24 ENCOUNTER — Ambulatory Visit (INDEPENDENT_AMBULATORY_CARE_PROVIDER_SITE_OTHER): Payer: Medicare Other | Admitting: Cardiology

## 2020-12-24 ENCOUNTER — Other Ambulatory Visit: Payer: Self-pay

## 2020-12-24 VITALS — BP 130/61 | HR 81 | Ht 69.0 in | Wt 265.0 lb

## 2020-12-24 DIAGNOSIS — I255 Ischemic cardiomyopathy: Secondary | ICD-10-CM

## 2020-12-24 DIAGNOSIS — I10 Essential (primary) hypertension: Secondary | ICD-10-CM | POA: Diagnosis not present

## 2020-12-24 DIAGNOSIS — I480 Paroxysmal atrial fibrillation: Secondary | ICD-10-CM | POA: Diagnosis not present

## 2020-12-24 DIAGNOSIS — I5022 Chronic systolic (congestive) heart failure: Secondary | ICD-10-CM | POA: Diagnosis not present

## 2020-12-24 DIAGNOSIS — I2510 Atherosclerotic heart disease of native coronary artery without angina pectoris: Secondary | ICD-10-CM | POA: Diagnosis not present

## 2020-12-24 MED ORDER — LOSARTAN POTASSIUM 25 MG PO TABS: 25.0000 mg | ORAL_TABLET | Freq: Every day | ORAL | 3 refills | Status: DC

## 2020-12-24 NOTE — Patient Instructions (Signed)
Medication Instructions:   START LOSARTAN 25 MG ONCE DAILY  *If you need a refill on your cardiac medications before your next appointment, please call your pharmacy*   Lab Work:  Your physician recommends that you return for lab work in: ONE WEEK-DO NOT [place]  If you have labs (blood work) drawn today and your tests are completely normal, you will receive your results only by: [place] (if you have MyChart) OR A paper copy in the mail If you have any lab test that is abnormal or we need to change your treatment, we will call you to review the results.   Follow-Up: At [facility], you and your health needs are our priority.  As part of our continuing mission to provide you with exceptional heart care, we have created designated Provider Care Teams.  These Care Teams include your primary Cardiologist (physician) and Advanced Practice Providers (APPs -  Physician Assistants and Nurse Practitioners) who all work together to provide you with the care you need, when you need it.  We recommend signing up for the patient portal called "MyChart".  Sign up information is provided on this After Visit Summary.  MyChart is used to connect with patients for Virtual Visits (Telemedicine).  Patients are able to view lab/test results, encounter notes, upcoming appointments, etc.  Non-urgent messages can be sent to your provider as well.   To learn more about what you can do with MyChart, go to [email or web address].    Your next appointment:   6 month(s)  The format for your next appointment:   In Person  Provider:   Kirk Ruths, MD

## 2020-12-28 DIAGNOSIS — E1122 Type 2 diabetes mellitus with diabetic chronic kidney disease: Secondary | ICD-10-CM | POA: Diagnosis not present

## 2020-12-28 DIAGNOSIS — E1140 Type 2 diabetes mellitus with diabetic neuropathy, unspecified: Secondary | ICD-10-CM | POA: Diagnosis not present

## 2020-12-28 DIAGNOSIS — I5042 Chronic combined systolic (congestive) and diastolic (congestive) heart failure: Secondary | ICD-10-CM | POA: Diagnosis not present

## 2020-12-28 DIAGNOSIS — G20 Parkinson's disease: Secondary | ICD-10-CM | POA: Diagnosis not present

## 2020-12-28 DIAGNOSIS — I130 Hypertensive heart and chronic kidney disease with heart failure and stage 1 through stage 4 chronic kidney disease, or unspecified chronic kidney disease: Secondary | ICD-10-CM | POA: Diagnosis not present

## 2020-12-28 DIAGNOSIS — N183 Chronic kidney disease, stage 3 unspecified: Secondary | ICD-10-CM | POA: Diagnosis not present

## 2020-12-31 DIAGNOSIS — N183 Chronic kidney disease, stage 3 unspecified: Secondary | ICD-10-CM | POA: Diagnosis not present

## 2020-12-31 DIAGNOSIS — E1122 Type 2 diabetes mellitus with diabetic chronic kidney disease: Secondary | ICD-10-CM | POA: Diagnosis not present

## 2020-12-31 DIAGNOSIS — E1140 Type 2 diabetes mellitus with diabetic neuropathy, unspecified: Secondary | ICD-10-CM | POA: Diagnosis not present

## 2020-12-31 DIAGNOSIS — I130 Hypertensive heart and chronic kidney disease with heart failure and stage 1 through stage 4 chronic kidney disease, or unspecified chronic kidney disease: Secondary | ICD-10-CM | POA: Diagnosis not present

## 2020-12-31 DIAGNOSIS — G20 Parkinson's disease: Secondary | ICD-10-CM | POA: Diagnosis not present

## 2020-12-31 DIAGNOSIS — I5042 Chronic combined systolic (congestive) and diastolic (congestive) heart failure: Secondary | ICD-10-CM | POA: Diagnosis not present

## 2021-01-01 DIAGNOSIS — J45909 Unspecified asthma, uncomplicated: Secondary | ICD-10-CM | POA: Diagnosis not present

## 2021-01-01 DIAGNOSIS — Z95810 Presence of automatic (implantable) cardiac defibrillator: Secondary | ICD-10-CM | POA: Diagnosis not present

## 2021-01-01 DIAGNOSIS — Z7985 Long-term (current) use of injectable non-insulin antidiabetic drugs: Secondary | ICD-10-CM | POA: Diagnosis not present

## 2021-01-01 DIAGNOSIS — N183 Chronic kidney disease, stage 3 unspecified: Secondary | ICD-10-CM | POA: Diagnosis not present

## 2021-01-01 DIAGNOSIS — I69820 Aphasia following other cerebrovascular disease: Secondary | ICD-10-CM | POA: Diagnosis not present

## 2021-01-01 DIAGNOSIS — Z951 Presence of aortocoronary bypass graft: Secondary | ICD-10-CM | POA: Diagnosis not present

## 2021-01-01 DIAGNOSIS — G20 Parkinson's disease: Secondary | ICD-10-CM | POA: Diagnosis not present

## 2021-01-01 DIAGNOSIS — I130 Hypertensive heart and chronic kidney disease with heart failure and stage 1 through stage 4 chronic kidney disease, or unspecified chronic kidney disease: Secondary | ICD-10-CM | POA: Diagnosis not present

## 2021-01-01 DIAGNOSIS — R350 Frequency of micturition: Secondary | ICD-10-CM | POA: Diagnosis not present

## 2021-01-01 DIAGNOSIS — Z9181 History of falling: Secondary | ICD-10-CM | POA: Diagnosis not present

## 2021-01-01 DIAGNOSIS — I2510 Atherosclerotic heart disease of native coronary artery without angina pectoris: Secondary | ICD-10-CM | POA: Diagnosis not present

## 2021-01-01 DIAGNOSIS — Z794 Long term (current) use of insulin: Secondary | ICD-10-CM | POA: Diagnosis not present

## 2021-01-01 DIAGNOSIS — E1122 Type 2 diabetes mellitus with diabetic chronic kidney disease: Secondary | ICD-10-CM | POA: Diagnosis not present

## 2021-01-01 DIAGNOSIS — G3184 Mild cognitive impairment, so stated: Secondary | ICD-10-CM | POA: Diagnosis not present

## 2021-01-01 DIAGNOSIS — E1140 Type 2 diabetes mellitus with diabetic neuropathy, unspecified: Secondary | ICD-10-CM | POA: Diagnosis not present

## 2021-01-01 DIAGNOSIS — I429 Cardiomyopathy, unspecified: Secondary | ICD-10-CM | POA: Diagnosis not present

## 2021-01-01 DIAGNOSIS — I69891 Dysphagia following other cerebrovascular disease: Secondary | ICD-10-CM | POA: Diagnosis not present

## 2021-01-01 DIAGNOSIS — I252 Old myocardial infarction: Secondary | ICD-10-CM | POA: Diagnosis not present

## 2021-01-01 DIAGNOSIS — I5042 Chronic combined systolic (congestive) and diastolic (congestive) heart failure: Secondary | ICD-10-CM | POA: Diagnosis not present

## 2021-01-01 DIAGNOSIS — E785 Hyperlipidemia, unspecified: Secondary | ICD-10-CM | POA: Diagnosis not present

## 2021-01-05 DIAGNOSIS — N183 Chronic kidney disease, stage 3 unspecified: Secondary | ICD-10-CM | POA: Diagnosis not present

## 2021-01-05 DIAGNOSIS — I130 Hypertensive heart and chronic kidney disease with heart failure and stage 1 through stage 4 chronic kidney disease, or unspecified chronic kidney disease: Secondary | ICD-10-CM | POA: Diagnosis not present

## 2021-01-05 DIAGNOSIS — E1140 Type 2 diabetes mellitus with diabetic neuropathy, unspecified: Secondary | ICD-10-CM | POA: Diagnosis not present

## 2021-01-05 DIAGNOSIS — I5042 Chronic combined systolic (congestive) and diastolic (congestive) heart failure: Secondary | ICD-10-CM | POA: Diagnosis not present

## 2021-01-05 DIAGNOSIS — E1122 Type 2 diabetes mellitus with diabetic chronic kidney disease: Secondary | ICD-10-CM | POA: Diagnosis not present

## 2021-01-05 DIAGNOSIS — G20 Parkinson's disease: Secondary | ICD-10-CM | POA: Diagnosis not present

## 2021-01-06 ENCOUNTER — Other Ambulatory Visit: Payer: Self-pay | Admitting: Neurology

## 2021-01-06 DIAGNOSIS — G20 Parkinson's disease: Secondary | ICD-10-CM

## 2021-01-06 DIAGNOSIS — G20A1 Parkinson's disease without dyskinesia, without mention of fluctuations: Secondary | ICD-10-CM

## 2021-01-13 ENCOUNTER — Other Ambulatory Visit: Payer: Self-pay | Admitting: Family Medicine

## 2021-01-14 DIAGNOSIS — G20 Parkinson's disease: Secondary | ICD-10-CM | POA: Diagnosis not present

## 2021-01-14 DIAGNOSIS — N183 Chronic kidney disease, stage 3 unspecified: Secondary | ICD-10-CM | POA: Diagnosis not present

## 2021-01-14 DIAGNOSIS — I130 Hypertensive heart and chronic kidney disease with heart failure and stage 1 through stage 4 chronic kidney disease, or unspecified chronic kidney disease: Secondary | ICD-10-CM | POA: Diagnosis not present

## 2021-01-14 DIAGNOSIS — E1140 Type 2 diabetes mellitus with diabetic neuropathy, unspecified: Secondary | ICD-10-CM | POA: Diagnosis not present

## 2021-01-14 DIAGNOSIS — E1122 Type 2 diabetes mellitus with diabetic chronic kidney disease: Secondary | ICD-10-CM | POA: Diagnosis not present

## 2021-01-14 DIAGNOSIS — I5042 Chronic combined systolic (congestive) and diastolic (congestive) heart failure: Secondary | ICD-10-CM | POA: Diagnosis not present

## 2021-01-14 NOTE — Telephone Encounter (Signed)
Refill request for Tramadol 50 mg tablets  LOV - 07/27/20 Next OV - not scheduled Last refill - 10/20/20 #60/2

## 2021-01-19 ENCOUNTER — Ambulatory Visit (INDEPENDENT_AMBULATORY_CARE_PROVIDER_SITE_OTHER): Payer: Medicare Other

## 2021-01-19 DIAGNOSIS — I255 Ischemic cardiomyopathy: Secondary | ICD-10-CM

## 2021-01-19 LAB — CUP PACEART REMOTE DEVICE CHECK
Battery Remaining Longevity: 36 mo
Battery Voltage: 2.96 V
Brady Statistic AP VP Percent: 0 %
Brady Statistic AP VS Percent: 0.85 %
Brady Statistic AS VP Percent: 0.07 %
Brady Statistic AS VS Percent: 99.08 %
Brady Statistic RA Percent Paced: 0.85 %
Brady Statistic RV Percent Paced: 0.07 %
Date Time Interrogation Session: 20230111001803
HighPow Impedance: 67 Ohm
Implantable Lead Implant Date: 20151105
Implantable Lead Implant Date: 20151105
Implantable Lead Location: 753859
Implantable Lead Location: 753860
Implantable Lead Model: 5076
Implantable Pulse Generator Implant Date: 20151105
Lead Channel Impedance Value: 399 Ohm
Lead Channel Impedance Value: 437 Ohm
Lead Channel Impedance Value: 494 Ohm
Lead Channel Pacing Threshold Amplitude: 0.5 V
Lead Channel Pacing Threshold Amplitude: 1.125 V
Lead Channel Pacing Threshold Pulse Width: 0.4 ms
Lead Channel Pacing Threshold Pulse Width: 0.4 ms
Lead Channel Sensing Intrinsic Amplitude: 1.75 mV
Lead Channel Sensing Intrinsic Amplitude: 1.75 mV
Lead Channel Sensing Intrinsic Amplitude: 16.375 mV
Lead Channel Sensing Intrinsic Amplitude: 16.375 mV
Lead Channel Setting Pacing Amplitude: 2.25 V
Lead Channel Setting Pacing Amplitude: 2.5 V
Lead Channel Setting Pacing Pulse Width: 0.4 ms
Lead Channel Setting Sensing Sensitivity: 0.3 mV

## 2021-01-20 DIAGNOSIS — I5042 Chronic combined systolic (congestive) and diastolic (congestive) heart failure: Secondary | ICD-10-CM | POA: Diagnosis not present

## 2021-01-20 DIAGNOSIS — E1140 Type 2 diabetes mellitus with diabetic neuropathy, unspecified: Secondary | ICD-10-CM | POA: Diagnosis not present

## 2021-01-20 DIAGNOSIS — I130 Hypertensive heart and chronic kidney disease with heart failure and stage 1 through stage 4 chronic kidney disease, or unspecified chronic kidney disease: Secondary | ICD-10-CM | POA: Diagnosis not present

## 2021-01-20 DIAGNOSIS — N183 Chronic kidney disease, stage 3 unspecified: Secondary | ICD-10-CM | POA: Diagnosis not present

## 2021-01-20 DIAGNOSIS — E1122 Type 2 diabetes mellitus with diabetic chronic kidney disease: Secondary | ICD-10-CM | POA: Diagnosis not present

## 2021-01-20 DIAGNOSIS — G20 Parkinson's disease: Secondary | ICD-10-CM | POA: Diagnosis not present

## 2021-01-21 DIAGNOSIS — E1140 Type 2 diabetes mellitus with diabetic neuropathy, unspecified: Secondary | ICD-10-CM | POA: Diagnosis not present

## 2021-01-21 DIAGNOSIS — N183 Chronic kidney disease, stage 3 unspecified: Secondary | ICD-10-CM | POA: Diagnosis not present

## 2021-01-21 DIAGNOSIS — E1122 Type 2 diabetes mellitus with diabetic chronic kidney disease: Secondary | ICD-10-CM | POA: Diagnosis not present

## 2021-01-21 DIAGNOSIS — I5042 Chronic combined systolic (congestive) and diastolic (congestive) heart failure: Secondary | ICD-10-CM | POA: Diagnosis not present

## 2021-01-21 DIAGNOSIS — G20 Parkinson's disease: Secondary | ICD-10-CM | POA: Diagnosis not present

## 2021-01-21 DIAGNOSIS — I130 Hypertensive heart and chronic kidney disease with heart failure and stage 1 through stage 4 chronic kidney disease, or unspecified chronic kidney disease: Secondary | ICD-10-CM | POA: Diagnosis not present

## 2021-01-28 DIAGNOSIS — I5042 Chronic combined systolic (congestive) and diastolic (congestive) heart failure: Secondary | ICD-10-CM | POA: Diagnosis not present

## 2021-01-28 DIAGNOSIS — G20 Parkinson's disease: Secondary | ICD-10-CM | POA: Diagnosis not present

## 2021-01-28 DIAGNOSIS — I130 Hypertensive heart and chronic kidney disease with heart failure and stage 1 through stage 4 chronic kidney disease, or unspecified chronic kidney disease: Secondary | ICD-10-CM | POA: Diagnosis not present

## 2021-01-28 DIAGNOSIS — N183 Chronic kidney disease, stage 3 unspecified: Secondary | ICD-10-CM | POA: Diagnosis not present

## 2021-01-28 DIAGNOSIS — E1122 Type 2 diabetes mellitus with diabetic chronic kidney disease: Secondary | ICD-10-CM | POA: Diagnosis not present

## 2021-01-28 DIAGNOSIS — E1140 Type 2 diabetes mellitus with diabetic neuropathy, unspecified: Secondary | ICD-10-CM | POA: Diagnosis not present

## 2021-01-28 NOTE — Progress Notes (Signed)
Remote ICD transmission.   

## 2021-01-31 DIAGNOSIS — H409 Unspecified glaucoma: Secondary | ICD-10-CM | POA: Diagnosis not present

## 2021-01-31 DIAGNOSIS — G4733 Obstructive sleep apnea (adult) (pediatric): Secondary | ICD-10-CM | POA: Diagnosis not present

## 2021-01-31 DIAGNOSIS — K219 Gastro-esophageal reflux disease without esophagitis: Secondary | ICD-10-CM | POA: Diagnosis not present

## 2021-01-31 DIAGNOSIS — G2581 Restless legs syndrome: Secondary | ICD-10-CM | POA: Diagnosis not present

## 2021-01-31 DIAGNOSIS — Z7985 Long-term (current) use of injectable non-insulin antidiabetic drugs: Secondary | ICD-10-CM | POA: Diagnosis not present

## 2021-01-31 DIAGNOSIS — I130 Hypertensive heart and chronic kidney disease with heart failure and stage 1 through stage 4 chronic kidney disease, or unspecified chronic kidney disease: Secondary | ICD-10-CM | POA: Diagnosis not present

## 2021-01-31 DIAGNOSIS — M109 Gout, unspecified: Secondary | ICD-10-CM | POA: Diagnosis not present

## 2021-01-31 DIAGNOSIS — G20 Parkinson's disease: Secondary | ICD-10-CM | POA: Diagnosis not present

## 2021-01-31 DIAGNOSIS — J45909 Unspecified asthma, uncomplicated: Secondary | ICD-10-CM | POA: Diagnosis not present

## 2021-01-31 DIAGNOSIS — E1140 Type 2 diabetes mellitus with diabetic neuropathy, unspecified: Secondary | ICD-10-CM | POA: Diagnosis not present

## 2021-01-31 DIAGNOSIS — G3184 Mild cognitive impairment, so stated: Secondary | ICD-10-CM | POA: Diagnosis not present

## 2021-01-31 DIAGNOSIS — N183 Chronic kidney disease, stage 3 unspecified: Secondary | ICD-10-CM | POA: Diagnosis not present

## 2021-01-31 DIAGNOSIS — I252 Old myocardial infarction: Secondary | ICD-10-CM | POA: Diagnosis not present

## 2021-01-31 DIAGNOSIS — E039 Hypothyroidism, unspecified: Secondary | ICD-10-CM | POA: Diagnosis not present

## 2021-01-31 DIAGNOSIS — I2510 Atherosclerotic heart disease of native coronary artery without angina pectoris: Secondary | ICD-10-CM | POA: Diagnosis not present

## 2021-01-31 DIAGNOSIS — N400 Enlarged prostate without lower urinary tract symptoms: Secondary | ICD-10-CM | POA: Diagnosis not present

## 2021-01-31 DIAGNOSIS — Z794 Long term (current) use of insulin: Secondary | ICD-10-CM | POA: Diagnosis not present

## 2021-01-31 DIAGNOSIS — I5042 Chronic combined systolic (congestive) and diastolic (congestive) heart failure: Secondary | ICD-10-CM | POA: Diagnosis not present

## 2021-01-31 DIAGNOSIS — I429 Cardiomyopathy, unspecified: Secondary | ICD-10-CM | POA: Diagnosis not present

## 2021-01-31 DIAGNOSIS — E1122 Type 2 diabetes mellitus with diabetic chronic kidney disease: Secondary | ICD-10-CM | POA: Diagnosis not present

## 2021-01-31 DIAGNOSIS — I69820 Aphasia following other cerebrovascular disease: Secondary | ICD-10-CM | POA: Diagnosis not present

## 2021-01-31 DIAGNOSIS — K5900 Constipation, unspecified: Secondary | ICD-10-CM | POA: Diagnosis not present

## 2021-01-31 DIAGNOSIS — E785 Hyperlipidemia, unspecified: Secondary | ICD-10-CM | POA: Diagnosis not present

## 2021-01-31 DIAGNOSIS — I69891 Dysphagia following other cerebrovascular disease: Secondary | ICD-10-CM | POA: Diagnosis not present

## 2021-02-02 ENCOUNTER — Telehealth: Payer: Self-pay

## 2021-02-02 NOTE — Progress Notes (Signed)
Chronic Care Management Pharmacy Assistant   Name: DEMARRION [name]  MRN: [id number] DOB: [birth year]  Reason for Encounter: CCM (Initial Questions)   Recent office visits:  12/23/2020 - Sharen Heck, LPN - Patient presented for Annual Wellness Visit.   Recent consult visits:  02/25/2020 - Olga Millers, MD - Cardiology - Patient presented for follow up for coronary artery disease. Labs: BMP. Start: losartan (COZAAR) 25 MG tablet - 1 tablet daily.  11/30/2020 Kerin Salen, DO - Neurology - Patient presented for parkinson's disease. Referral to: Home Health. Start: carbidopa-levodopa (SINEMET CR) 50-200 MG tablet - Take at bedtime. This is to be taken in conjunction with carbidopa/levodopa 25/100 CR, currently taking 2 tablets up to 5 times per day (generally takes 9 or 10 tablets/day). 11/11/2020 - Cristie Hem, DPM - Podiatry - Patient presented for thickened toenails. Procedure: debrided nailbeds 1-5 both feet no iatrogenic bleeding . 10/07/2020 - Reather Littler, MD - Endocrinology - Patient presented for diabetes. Labs: BMP, A1c and Microalbumin. No medication changes.  09/29/2020 - Lewayne Bunting, MD - Cardiology - Patient presented for follow up for pacemaker function. Normal device function. No medication changes.   Hospital visits:  None in previous 6 months  Medications: Outpatient Encounter Medications as of 02/02/2021  Medication Sig   acetaminophen (TYLENOL) 500 MG tablet Take 1,000 mg by mouth at bedtime as needed for mild pain or headache.    albuterol (VENTOLIN HFA) 108 (90 Base) MCG/ACT inhaler Inhale 2 puffs into the lungs every 6 (six) hours as needed for wheezing or shortness of breath.   allopurinol (ZYLOPRIM) 100 MG tablet TAKE 1 & 1/2 TABLET BY MOUTH DAILY   aspirin EC 81 MG tablet Take 1 tablet (81 mg total) by mouth daily. Swallow whole.   carbidopa-levodopa (SINEMET CR) 50-200 MG tablet TAKE 1 TABLET BY MOUTH EVERYDAY AT BEDTIME   Carbidopa-Levodopa ER (SINEMET CR)  25-100 MG tablet controlled release 2 po 5 times per day as directed   Cholecalciferol (VITAMIN D) 50 MCG (2000 UT) tablet Take 2,000 Units by mouth daily.    colchicine 0.6 MG tablet Take 1 tablet (0.6 mg total) by mouth daily as needed (gout).   dorzolamide-timolol (COSOPT) 22.3-6.8 MG/ML ophthalmic solution Place 1 drop into the right eye 2 (two) times daily.   fluticasone-salmeterol (ADVAIR DISKUS) 250-50 MCG/ACT AEPB Inhale 1 puff into the lungs in the morning and at bedtime. Rinse after use.   FREESTYLE LITE test strip USE TO CHECK BLOOD SUGAR 4 TIMES DAILY.   furosemide (LASIX) 80 MG tablet TAKE 1 TABLET BY MOUTH EVERY DAY   gabapentin (NEURONTIN) 100 MG capsule TAKE 2 CAPSULES BY MOUTH EVERY MORNING AND TAKE 4 CAPSULES BY MOUTH EVERY EVENING   HUMALOG 100 UNIT/ML injection USE MAXIMUM 76 UNITS PER   DAY WITH V-GO PUMP   Insulin Disposable Pump (OMNIPOD DASH SYSTEM) KIT 1 each by Does not apply route. Use Dash system for Continuous Blood Glucose Monitoring.   levothyroxine (SYNTHROID) 137 MCG tablet TAKE 1 TABLET BY MOUTH DAILY BEFORE BREAKFAST.   losartan (COZAAR) 25 MG tablet Take 1 tablet (25 mg total) by mouth daily.   metoprolol succinate (TOPROL-XL) 50 MG 24 hr tablet TAKE 1 TABLET BY MOUTH DAILY. TAKE WITH OR IMMEDIATELY FOLLOWING A MEAL.   MYRBETRIQ 25 MG TB24 tablet TAKE 1 TABLET BY MOUTH EVERY DAY   nitroGLYCERIN (NITROSTAT) 0.4 MG SL tablet Place 1 tablet (0.4 mg total) under the tongue every 5 (five) minutes as needed for  chest pain.   omega-3 acid ethyl esters (LOVAZA) 1 g capsule TAKE 2 CAPSULES BY MOUTH TWICE A DAY   OVER THE COUNTER MEDICATION Apply 1 application topically daily as needed (pain). Theraworx Pain Cream   OZEMPIC, 1 MG/DOSE, 4 MG/3ML SOPN INJECT $RemoveBef'1MG'TnuKogrxdB$  SUBCUTANEOUSLY  ONCE A WEEK.   polyethylene glycol (MIRALAX / GLYCOLAX) packet Take 8.5 g by mouth daily.   potassium chloride SA (KLOR-CON) 20 MEQ tablet TAKE 3 TABLETS (60 MEQ TOTAL) BY MOUTH DAILY.   Propylene  Glycol (SYSTANE BALANCE OP) Place 1 drop into the left eye daily as needed (dry eyes). Uses Systane gel apply to the eye as needed   rosuvastatin (CRESTOR) 20 MG tablet TAKE 1 TABLET BY MOUTH EVERYDAY AT BEDTIME   traMADol (ULTRAM) 50 MG tablet TAKE 1 TABLET BY MOUTH TWICE A DAY   vitamin B-12 (CYANOCOBALAMIN) 1000 MCG tablet Take 1,000 mcg by mouth daily.   No facility-administered encounter medications on file as of 02/02/2021.   Lab Results  Component Value Date/Time   HGBA1C 6.3 10/04/2020 09:30 AM   HGBA1C 6.3 06/24/2020 08:55 AM   MICROALBUR <0.7 12/19/2019 10:36 AM   MICROALBUR <0.7 06/14/2018 08:14 AM    BP Readings from Last 3 Encounters:  12/24/20 130/61  11/30/20 137/62  10/07/20 130/80   Patient contacted to review initial questions prior to visit with Henry [name].  Have you seen any other providers since your last visit with PCP? Yes  Any changes in your medications or health? Yes Start: losartan (COZAAR) 25 MG tablet - 1 tablet daily - stopped taking by patient due to diarrhea.  Start: carbidopa-levodopa (SINEMET CR) 50-200 MG tablet - Take at bedtime. This is to be taken in conjunction with carbidopa/levodopa 25/100 CR, currently taking 2 tablets up to 5 times per day (generally takes 9 or 10 tablets/day).  Any side effects from any medications? Yes - Patient was having diarrhea due to the Losartan so he stopped taking it. No providers have been made aware he stopped the medication.   Do you have an symptoms or problems not managed by your medications? No  Any concerns about your health right now? No  Has your provider asked that you check blood pressure, blood sugar, or follow special diet at home? Yes I asked patient and his wife to take his blood pressure and blood sugar daily starting today. I advised them that Henry [name] would ask for both readings when she calls for his appointment on 02/08/2021. Both patient and his wife understood and agreed.   Do you get  any type of exercise on a regular basis? No Patient can only walk to the bathroom and back. Patient states it is about 30 feet.   Can you think of a goal you would like to reach for your health? Yes - Patient would like to maintain what he has.  Do you have any problems getting your medications? No  Is there anything that you would like to discuss during the appointment? No  Patient did have Physical Therapy coming in twice a week, but that has ended. Patient has speech therapy that comes in 1 time a week. Patient does have a machine he blows in to so he can exercise his lungs. Patient's wife states his Parkinson's Disease makes it hard for him to talk and breathe. I commended that patient on all his hard work and explained to him that I could clearly understand everything he was telling me.   Spoke with patient and  reminded them to have all medications, supplements and any blood glucose and blood pressure readings available for review with pharmacist, at their telephone visit on 02/08/2021 at 11:00 am.    Star Rating Drugs:  Medication:  Last Fill: Day Supply Losartan 25 mg 12/24/2020 90 Rosuvastatin 20 mg 11/15/2020 90 Ozempic 1 mg  12/17/2020 84   Humalog 100 unit 12/17/2020 79  Care Gaps: Annual wellness visit in last year? Yes on 12/23/2020 Most Recent BP reading: 130/61 on 12/24/2020  If Diabetic: Most recent A1C reading: 6.3 on 10/04/2020 Last eye exam / retinopathy screening: Up to date Last diabetic foot exam: Up to date  Henry [name], CPP notified  Henry [name], [place] Assistant [phone] .  Time Spent: 50 Minutes

## 2021-02-04 ENCOUNTER — Other Ambulatory Visit (INDEPENDENT_AMBULATORY_CARE_PROVIDER_SITE_OTHER): Payer: Medicare Other

## 2021-02-04 ENCOUNTER — Other Ambulatory Visit: Payer: Self-pay

## 2021-02-04 DIAGNOSIS — E1165 Type 2 diabetes mellitus with hyperglycemia: Secondary | ICD-10-CM | POA: Diagnosis not present

## 2021-02-04 DIAGNOSIS — Z794 Long term (current) use of insulin: Secondary | ICD-10-CM | POA: Diagnosis not present

## 2021-02-04 LAB — MICROALBUMIN / CREATININE URINE RATIO
Creatinine,U: 31.4 mg/dL
Microalb Creat Ratio: 2.2 mg/g (ref 0.0–30.0)
Microalb, Ur: <0.7 mg/dL (ref 0.0–1.9)

## 2021-02-04 LAB — BASIC METABOLIC PANEL
BUN: 22 mg/dL (ref 6–23)
CO2: 34 mEq/L — ABNORMAL HIGH (ref 19–32)
Calcium: 8.9 mg/dL (ref 8.4–10.5)
Chloride: 101 mEq/L (ref 96–112)
Creatinine, Ser: 1.38 mg/dL (ref 0.40–1.50)
GFR: 52.17 mL/min — ABNORMAL LOW (ref >60.00)
Glucose, Bld: 155 mg/dL — ABNORMAL HIGH (ref 70–99)
Potassium: 4.1 mEq/L (ref 3.5–5.1)
Sodium: 142 mEq/L (ref 135–145)

## 2021-02-07 ENCOUNTER — Other Ambulatory Visit: Payer: Self-pay

## 2021-02-07 ENCOUNTER — Telehealth: Payer: Medicare Other

## 2021-02-07 ENCOUNTER — Ambulatory Visit (INDEPENDENT_AMBULATORY_CARE_PROVIDER_SITE_OTHER): Payer: Medicare Other | Admitting: Endocrinology

## 2021-02-07 ENCOUNTER — Encounter: Payer: Self-pay | Admitting: Endocrinology

## 2021-02-07 VITALS — BP 118/82 | HR 73 | Ht 68.0 in | Wt 265.4 lb

## 2021-02-07 DIAGNOSIS — E1165 Type 2 diabetes mellitus with hyperglycemia: Secondary | ICD-10-CM

## 2021-02-07 DIAGNOSIS — Z794 Long term (current) use of insulin: Secondary | ICD-10-CM

## 2021-02-07 DIAGNOSIS — I255 Ischemic cardiomyopathy: Secondary | ICD-10-CM

## 2021-02-07 DIAGNOSIS — E890 Postprocedural hypothyroidism: Secondary | ICD-10-CM

## 2021-02-07 DIAGNOSIS — E782 Mixed hyperlipidemia: Secondary | ICD-10-CM | POA: Diagnosis not present

## 2021-02-07 LAB — HEMOGLOBIN A1C: Hgb A1c MFr Bld: 6.5 % (ref 4.6–6.5)

## 2021-02-07 NOTE — Progress Notes (Signed)
Patient ID: Henry [name], male   DOB: 1950-04-09, 71 y.o.   MRN: [id number]           Reason for Appointment: Follow-up for Type 2 Diabetes   History of Present Illness:          Diagnosis: Type 2 diabetes mellitus, date of diagnosis: 2000       Recent history:    INSULIN regimen: Omnipod insulin pump regional with Humalog insulin   Basal rate programs: Midnight = 1.5, 7 AM = 1.9, 11 a.m. = 2.5, total 51 units  Boluses 3-5 units for breakfast, 4-6 lunch and 6-7 units for dinner, 2-3 units for snacks Sensitivity 1: 60 carb ratio 1:10, target 120  Non-insulin hypoglycemic drugs the patient is taking are: Ozempic 1 mg weekly  His A1c is about the same at 6.5    Current management, blood sugar patterns and problems identified: He still has not pursued the freestyle libre system as discussed last couple of times even though he is complaining of painful fingersticks  Currently his blood sugar monitoring has been restricted to mostly at breakfast time  Although fasting readings are on an average fairly good he has not checked any readings after meals  Some of his high readings in the morning may be related to snacks late at night without any bolus coverage such as peanut butter crackers or occasionally some chocolates Usually getting 45-65 g of carbohydrate for his meals, higher intake at dinnertime No hypoglycemia reported also  Thinks he is trying to bolus at the time of his meals  Previously he did not think he had coverage for the OmniPod version #4 and is still using the classic OmniPod  Overall still requiring about 80% of his insulin as basal He is just getting some physical therapy for exercise, not able to walk much because of shortness of breath Again has difficulty losing weight despite taking Ozempic regularly   He has seen the dietitian in 04/2017  Not able to exercise because of back pain and difficulty walking       Side effects from medications have been:  None Compliance with the medical regimen: Fair   Glucose monitoring:  usually 3 times  a day         Glucometer:  Freestyle with the OmniPod PDM       Blood Glucose readings by download of pump     PRE-MEAL Fasting Lunch Dinner Bedtime Overall  Glucose range: 90-158  97-161  90-202  Mean/median: 120    123   POST-MEAL PC Breakfast PC Lunch PC Dinner  Glucose range: 143-202    Mean/median:      Previously:  PRE-MEAL Fasting Lunch Dinner Bedtime Overall  Glucose range: 90-184    90-194  Mean/median: 122 130 125  127   POST-MEAL PC Breakfast PC Lunch PC Dinner  Glucose range:   123, 149  Mean/median:         Meals: 9 am , 2-3 pm and 6-7 pm            Dietician visit, most recent: 4/19               Weight history:   Wt Readings from Last 3 Encounters:  02/07/21 265 lb 6.4 oz (120.4 kg)  12/24/20 265 lb (120.2 kg)  12/23/20 260 lb (117.9 kg)    Glycemic control:   Lab Results  Component Value Date   HGBA1C 6.5 02/04/2021   HGBA1C 6.3 10/04/2020  HGBA1C 6.3 06/24/2020   Lab Results  Component Value Date   MICROALBUR <0.7 02/04/2021   LDLCALC 47 06/24/2020   CREATININE 1.38 02/04/2021    Lab Results  Component Value Date   FRUCTOSAMINE 359 (H) 07/01/2019   FRUCTOSAMINE 277 11/09/2017   FRUCTOSAMINE 350 (H) 03/29/2017    Past history:   He has been taking Amaryl for several years and probably metformin since onset also At some point he was changed from metformin to [place] and was taking this since at least 2012 A1c had been higher in 2015 Since his A1c had been progressively higher with his regimen of Janumet and Amaryl he was started on Victoza in 01/2014 He was started on Lantus insulin in 5/16 because of persistent hyperglycemia especially fasting; was having readings as high as 293 Because of tendency to high postprandial readings and for control with Lantus he was switched to the V-go pump in  09/2014   Other active problems: See review of  systems    Allergies as of 02/07/2021       Reactions   Penicillins Anaphylaxis, Rash   Because of a history of documented adverse serious drug reaction;Medi Alert bracelet  is recommended PATIENT HAS HAD A PCN REACTION WITH IMMEDIATE RASH, FACIAL/TONGUE/THROAT SWELLING, SOB, OR LIGHTHEADEDNESS WITH HYPOTENSION:  #  #  YES  #  #  Has patient had a PCN reaction causing severe rash involving mucus membranes or skin necrosis: unknown Has patient had a PCN reaction that required hospitalization NO Has patient had a PCN reaction occurring within the last 10 years: NO   Klonopin [clonazepam] Other (See Comments)   agitation   Peanut-containing Drug Products Cough   Watermelon [citrullus Vulgaris] Other (See Comments), Cough   Tickle in throat        Medication List        Accurate as of February 07, 2021  2:23 PM. If you have any questions, ask your nurse or doctor.          acetaminophen 500 MG tablet Commonly known as: TYLENOL Take 1,000 mg by mouth at bedtime as needed for mild pain or headache.   albuterol 108 (90 Base) MCG/ACT inhaler Commonly known as: VENTOLIN HFA Inhale 2 puffs into the lungs every 6 (six) hours as needed for wheezing or shortness of breath.   allopurinol 100 MG tablet Commonly known as: ZYLOPRIM TAKE 1 & 1/2 TABLET BY MOUTH DAILY   aspirin EC 81 MG tablet Take 1 tablet (81 mg total) by mouth daily. Swallow whole.   Carbidopa-Levodopa ER 25-100 MG tablet controlled release Commonly known as: SINEMET CR 2 po 5 times per day as directed   carbidopa-levodopa 50-200 MG tablet Commonly known as: SINEMET CR TAKE 1 TABLET BY MOUTH EVERYDAY AT BEDTIME   colchicine 0.6 MG tablet Take 1 tablet (0.6 mg total) by mouth daily as needed (gout).   dorzolamide-timolol 22.3-6.8 MG/ML ophthalmic solution Commonly known as: COSOPT Place 1 drop into the right eye 2 (two) times daily.   fluticasone-salmeterol 250-50 MCG/ACT Aepb Commonly known as: Advair  Diskus Inhale 1 puff into the lungs in the morning and at bedtime. Rinse after use.   FREESTYLE LITE test strip Generic drug: glucose blood USE TO CHECK BLOOD SUGAR 4 TIMES DAILY.   furosemide 80 MG tablet Commonly known as: LASIX TAKE 1 TABLET BY MOUTH EVERY DAY   gabapentin 100 MG capsule Commonly known as: NEURONTIN TAKE 2 CAPSULES BY MOUTH EVERY MORNING AND TAKE 4 CAPSULES  BY MOUTH EVERY EVENING   HumaLOG 100 UNIT/ML injection Generic drug: insulin lispro USE MAXIMUM 76 UNITS PER   DAY WITH V-GO PUMP   levothyroxine 137 MCG tablet Commonly known as: SYNTHROID TAKE 1 TABLET BY MOUTH DAILY BEFORE BREAKFAST.   losartan 25 MG tablet Commonly known as: COZAAR Take 1 tablet (25 mg total) by mouth daily.   metoprolol succinate 50 MG 24 hr tablet Commonly known as: TOPROL-XL TAKE 1 TABLET BY MOUTH DAILY. TAKE WITH OR IMMEDIATELY FOLLOWING A MEAL.   Myrbetriq 25 MG Tb24 tablet Generic drug: mirabegron ER TAKE 1 TABLET BY MOUTH EVERY DAY   nitroGLYCERIN 0.4 MG SL tablet Commonly known as: NITROSTAT Place 1 tablet (0.4 mg total) under the tongue every 5 (five) minutes as needed for chest pain.   omega-3 acid ethyl esters 1 g capsule Commonly known as: LOVAZA TAKE 2 CAPSULES BY MOUTH TWICE A DAY   Omnipod DASH PDM (Gen 4) Kit 1 each by Does not apply route. Use Dash system for Continuous Blood Glucose Monitoring.   OVER THE COUNTER MEDICATION Apply 1 application topically daily as needed (pain). Theraworx Pain Cream   Ozempic (1 MG/DOSE) 4 MG/3ML Sopn Generic drug: Semaglutide (1 MG/DOSE) INJECT 1MG SUBCUTANEOUSLY  ONCE A WEEK.   polyethylene glycol 17 g packet Commonly known as: MIRALAX / GLYCOLAX Take 8.5 g by mouth daily.   potassium chloride SA 20 MEQ tablet Commonly known as: KLOR-CON M TAKE 3 TABLETS (60 MEQ TOTAL) BY MOUTH DAILY.   rosuvastatin 20 MG tablet Commonly known as: CRESTOR TAKE 1 TABLET BY MOUTH EVERYDAY AT BEDTIME   SYSTANE BALANCE OP Place  1 drop into the left eye daily as needed (dry eyes). Uses Systane gel apply to the eye as needed   traMADol 50 MG tablet Commonly known as: ULTRAM TAKE 1 TABLET BY MOUTH TWICE A DAY   vitamin B-12 1000 MCG tablet Commonly known as: CYANOCOBALAMIN Take 1,000 mcg by mouth daily.   Vitamin D 50 MCG (2000 UT) tablet Take 2,000 Units by mouth daily.        Allergies:  Allergies  Allergen Reactions   Penicillins Anaphylaxis and Rash    Because of a history of documented adverse serious drug reaction;Medi Alert bracelet  is recommended PATIENT HAS HAD A PCN REACTION WITH IMMEDIATE RASH, FACIAL/TONGUE/THROAT SWELLING, SOB, OR LIGHTHEADEDNESS WITH HYPOTENSION:  #  #  YES  #  #  Has patient had a PCN reaction causing severe rash involving mucus membranes or skin necrosis: unknown Has patient had a PCN reaction that required hospitalization NO Has patient had a PCN reaction occurring within the last 10 years: NO   Klonopin [Clonazepam] Other (See Comments)    agitation   Peanut-Containing Drug Products Cough   Watermelon [Citrullus Vulgaris] Other (See Comments) and Cough    Tickle in throat    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)   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   Dysrhythmia    a-fib  GERD (gastroesophageal reflux disease)    Glaucoma    right eye   History of kidney stones    multiple   HLD (hyperlipidemia)    HTN (hypertension)    pt denies 08/19/12   Hyperplasia, prostate    Hypothyroidism    MI (myocardial infarction) ([place])    Dr Crenshaw 2000, x3vessels bypass   OSA (obstructive sleep apnea)     AHI-28,on CPAP, noncompliant with CPAP   Parkinson disease ([place])    1999   PONV (postoperative nausea and vomiting)    Restless legs    Shortness of breath    Hx: of at all times   UTI (lower urinary tract infection) 09/15/12   Klebsiella   Ventral hernia    Walker as ambulation aid    also uses wheelchair at home/when going out    Past Surgical History:  Procedure Laterality Date   [place]   right total loss   BIOPSY  10/14/2018   Procedure: BIOPSY;  Surgeon: Jerene Bears, MD;  Location: [facility];  Service: Gastroenterology;;   CARDIAC CATHETERIZATION     CATARACT EXTRACTION W/ INTRAOCULAR LENS IMPLANT     Hx: of right eye   CATARACT EXTRACTION W/ INTRAOCULAR LENS IMPLANT Left 2018   COLONOSCOPY N/A 10/13/2014   Procedure: COLONOSCOPY;  Surgeon: Jerene Bears, MD;  Location: [facility];  Service: Gastroenterology;  Laterality: N/A;   COLONOSCOPY W/ BIOPSIES AND POLYPECTOMY     Hx: of   COLONOSCOPY WITH PROPOFOL N/A 10/14/2018   Procedure: COLONOSCOPY WITH PROPOFOL;  Surgeon: Jerene Bears, MD;  Location: [facility];  Service: Gastroenterology;  Laterality: N/A;   CORONARY ARTERY BYPASS GRAFT  2000   [name], MD   CORONARY STENT PLACEMENT  1998   DEEP BRAIN STIMULATOR PLACEMENT  2004   Right and left VIN stimulator placement (parkinsons)   EYE SURGERY     FINGER AMPUTATION     left pointer   IMPLANTABLE CARDIOVERTER DEFIBRILLATOR IMPLANT N/A 11/13/2013   Procedure: IMPLANTABLE CARDIOVERTER DEFIBRILLATOR IMPLANT;  Surgeon: Evans Lance, MD;  Location: [facility];  Service: Cardiovascular;  Laterality: N/A;   INSERT / REPLACE / REMOVE PACEMAKER     medtronic   LEFT AND RIGHT HEART CATHETERIZATION WITH CORONARY ANGIOGRAM N/A 09/24/2013   Procedure: LEFT AND RIGHT HEART CATHETERIZATION WITH CORONARY ANGIOGRAM;  Surgeon: Burnell Blanks, MD;  Location: [facility] CATH LAB;  Service: Cardiovascular;  Laterality: N/A;   LITHOTRIPSY     3  different times   MEDIAN STERNOTOMY  2000   POLYPECTOMY  10/14/2018   Procedure: POLYPECTOMY;  Surgeon: Jerene Bears, MD;  Location: [facility];  Service: Gastroenterology;;   PULSE GENERATOR IMPLANT Right 11/13/2017   Procedure: Right chest implantable pulse generator change;  Surgeon: Erline Levine, MD;  Location: [facility];  Service: Neurosurgery;  Laterality: Right;  Right chest implantable pulse generator change   SUBTHALAMIC STIMULATOR BATTERY REPLACEMENT N/A 09/05/2012   Procedure: Deep brain stimulator battery change;  Surgeon: Erline Levine, MD;  Location: [place] [facility];  Service: Neurosurgery;  Laterality: N/A;  Deep brain stimulator battery change   SUBTHALAMIC STIMULATOR BATTERY REPLACEMENT N/A 06/10/2015   Procedure: Deep Brain stimulator battery change;  Surgeon: Erline Levine, MD;  Location: [place] [facility];  Service: Neurosurgery;  Laterality: N/A;   SUBTHALAMIC STIMULATOR BATTERY REPLACEMENT Right 09/25/2019   Procedure: Deep brain stimulator battery change;  Surgeon: Erline Levine, MD;  Location: [place];  Service: Neurosurgery;  Laterality: Right;   TONSILLECTOMY      Family History  Problem Relation Age of Onset   Aneurysm Mother    Alcoholism Father    HIV/AIDS Brother 62       AIDS   Healthy Sister    Healthy Child    Peripheral vascular disease Other    Arthritis Other    Healthy Sister    Healthy Child    Healthy Child    Diabetes Neg Hx    Heart disease Neg Hx    Colon cancer Neg Hx    Prostate cancer Neg Hx     Social History:  reports that he has never smoked. He has never used smokeless tobacco. He reports that he does not currently use alcohol. He reports that he does not use drugs.    Review of Systems     HYPERLIPIDEMIA:  He is on Crestor 20 mg with good control of LDL, has low HDL Triglycerides are last at 150, previously over 200, treated with Lovaza 2 g twice a day HDL has improved      Lab Results  Component Value Date   CHOL 110 06/24/2020   CHOL  130 04/24/2019   CHOL 116 09/18/2018   Lab Results  Component Value Date   HDL 32.30 (L) 06/24/2020   HDL 23.20 (L) 04/24/2019   HDL 25.80 (L) 09/18/2018   Lab Results  Component Value Date   LDLCALC 47 06/24/2020   LDLCALC 118 (H) 09/16/2015   LDLCALC 126 (H) 04/08/2013   Lab Results  Component Value Date   TRIG 150.0 (H) 06/24/2020   TRIG 297.0 (H) 04/24/2019   TRIG 270.0 (H) 09/18/2018   Lab Results  Component Value Date   CHOLHDL 3 06/24/2020   CHOLHDL 6 04/24/2019   CHOLHDL 4 09/18/2018   Lab Results  Component Value Date   LDLDIRECT 51.0 04/24/2019   LDLDIRECT 54.0 09/18/2018   LDLDIRECT 67.0 03/12/2018              Lab Results  Component Value Date   ALT 3 06/24/2020        HYPOTHYROIDISM:   He has had hypothyroidism following radioactive iodine treatment for hyperthyroidism in 2013  Last TSH has been normal with taking 137 mcg levothyroxine, 7.5 tablets a week   Lab Results  Component Value Date   TSH 3.09 06/24/2020   TSH 2.13 03/24/2020   TSH 4.66 (H) 12/19/2019   FREET4 0.77 04/24/2019   FREET4 0.88 08/16/2018   FREET4 0.75 03/12/2018   Has chronic systolic CHF Prescribed 80 mg Lasix daily without metolazone  Blood pressure history:  BP Readings from Last 3 Encounters:  02/07/21 118/82  12/24/20 130/61  11/30/20 137/62    CKD: Has variable creatinine levels as follows: Has been followed by nephrologist, Wilder Glade was stopped when he had UTI in 5/22 No   microalbuminuria   He was prescribed LOSARTAN 25 mg by his cardiologist but he thinks he had abdominal pain for a week with this and has not taken it  Lab Results  Component Value Date   CREATININE 1.38 02/04/2021   CREATININE 1.44 10/04/2020   CREATININE 1.52 (H) 06/24/2020    Neuropathy: Taking gabapentin bid from his PCP. Has history of numbness in his toes  Diabetic foot exam in 9/22  Last eye exam 11/20, negative  Physical Examination:  BP 118/82    Pulse 73    Ht 5'  8" (1.727 m)    Wt 265  lb 6.4 oz (120.4 kg)    SpO2 98%    BMI 40.35 kg/m      ASSESSMENT/PLAN  Diabetes type 2, insulin-dependent with obesity     See history of present illness for detailed discussion of his current management, blood sugar patterns and problems identified  His A1c is consistently in the normal range and now 6.5  He is using the Omni pod classic insulin pump also Ozempic 1 mg weekly  Blood sugars in the mornings are fairly good With Ozempic and relatively good diet overall over the last several months he has required a little less insulin However unable to adjust his mealtime insulin based on only fasting blood sugars and discussed need to check readings consistently at different times especially after meals Discussed blood sugar targets before and after meals  Again he can probably work harder on weight loss Also can do better with glucose monitoring and will definitely benefit from freestyle libre glucose of his meter forgetting to check his sugars in the evenings or finding fingersticks painful Is unable to do much exercise  He will continue the same pump settings May need to bolus larger amounts if postprandial readings are over 180 consistently Also may need to bolus 2 to 4 units for snacks at night based on his carbohydrate intake  HYPOTHYROIDISM: TSH previously normal, will order labs for next visit   Mixed Hyperlipidemia: Will need follow-up labs on the next visit  RENAL dysfunction:  stable and only mildly abnormal  Since he has not been able to take [place] encouraged him to try losartan again but take it on a full stomach, unlikely that this caused abdominal pain   Patient Instructions  Check on Freestyle [facility] to try Losartan after dinner  Bolus for all Carb snacks   Check blood sugars on waking up    Also check blood sugars about 2 hours after meals and do this after different meals by rotation  Recommended blood sugar levels on  waking up are 90-130 and about 2 hours after meal is 130-160  Please bring your blood sugar monitor to each visit, thank you    Follow-up in 4 months    Elayne Snare 02/07/2021, 2:23 PM   Note: This office note was prepared with Dragon voice recognition system technology. Any transcriptional errors that result from this process are unintentional.

## 2021-02-07 NOTE — Patient Instructions (Addendum)
Check on Freestyle [facility] to try Losartan after dinner  Bolus for all Carb snacks   Check blood sugars on waking up    Also check blood sugars about 2 hours after meals and do this after different meals by rotation  Recommended blood sugar levels on waking up are 90-130 and about 2 hours after meal is 130-160  Please bring your blood sugar monitor to each visit, thank you

## 2021-02-08 ENCOUNTER — Ambulatory Visit (INDEPENDENT_AMBULATORY_CARE_PROVIDER_SITE_OTHER): Payer: Medicare Other | Admitting: Pharmacist

## 2021-02-08 DIAGNOSIS — E7800 Pure hypercholesterolemia, unspecified: Secondary | ICD-10-CM | POA: Diagnosis not present

## 2021-02-08 DIAGNOSIS — I10 Essential (primary) hypertension: Secondary | ICD-10-CM

## 2021-02-08 DIAGNOSIS — G20 Parkinson's disease: Secondary | ICD-10-CM

## 2021-02-08 DIAGNOSIS — J45909 Unspecified asthma, uncomplicated: Secondary | ICD-10-CM | POA: Diagnosis not present

## 2021-02-08 DIAGNOSIS — Z794 Long term (current) use of insulin: Secondary | ICD-10-CM | POA: Diagnosis not present

## 2021-02-08 DIAGNOSIS — I5042 Chronic combined systolic (congestive) and diastolic (congestive) heart failure: Secondary | ICD-10-CM | POA: Diagnosis not present

## 2021-02-08 DIAGNOSIS — E890 Postprocedural hypothyroidism: Secondary | ICD-10-CM

## 2021-02-08 DIAGNOSIS — I110 Hypertensive heart disease with heart failure: Secondary | ICD-10-CM | POA: Diagnosis not present

## 2021-02-08 DIAGNOSIS — E1122 Type 2 diabetes mellitus with diabetic chronic kidney disease: Secondary | ICD-10-CM

## 2021-02-08 DIAGNOSIS — N1831 Chronic kidney disease, stage 3a: Secondary | ICD-10-CM

## 2021-02-08 NOTE — Progress Notes (Signed)
Chronic Care Management Pharmacy Note  02/08/2021 Name:  Henry [name] MRN:  [id number] DOB:  Oct 13, 1950  Summary: -Initial CCM visit: pt endorses compliance with medications, his wife organizes them for him in pill boxes -Pt stopped losartan (prescribed by cardiologist 12/2020 for HF) due to diarrhea. BP is on average < 130/80 without losartan -Pt checks fasting BG almost every day; he is using an insulin pump and would benefit from CGM; Dr Dwyane Dee has prescribed [facility] and Dexcom in the past but copay was too high previously - coverage likely improved in 2023  Recommendations/Changes made from today's visit: -Advised trial of losartan 1/2 tab after dinner; if he cannot tolerate, he will contact cardiology -Advised pt to call [facility] to request Freestyle Libre  Plan: -[facility] will call patient 1 month f/u Freestyle Elenor Legato -Pharmacist follow up televisit scheduled for 3 months -PCP annual visit due July 2023, not yet scheduled    Subjective: Henry [name] is an 71 y.o. year old male who is a primary patient of Damita Dunnings, Elveria Rising, MD.  The CCM team was consulted for assistance with disease management and care coordination needs.    Engaged with patient by telephone for initial visit in response to provider referral for pharmacy case management and/or care coordination services.   Consent to Services:  The patient was given the following information about Chronic Care Management services today, agreed to services, and gave verbal consent: 1. CCM service includes personalized support from designated clinical staff supervised by the primary care provider, including individualized plan of care and coordination with other care providers 2. 24/7 contact phone numbers for assistance for urgent and routine care needs. 3. Service will only be billed when office clinical staff spend 20 minutes or more in a month to coordinate care. 4. Only one practitioner may furnish and  bill the service in a calendar month. 5.The patient may stop CCM services at any time (effective at the end of the month) by phone call to the office staff. 6. The patient will be responsible for cost sharing (co-pay) of up to 20% of the service fee (after annual deductible is met). Patient agreed to services and consent obtained.  Patient Care Team: Tonia Ghent, MD as PCP - General (Family Medicine) Stanford Breed Denice Bors, MD as PCP - Cardiology (Cardiology) Kidney, Henrine Screws, Eustace Quail, DO as Consulting Physician (Neurology) Charlton Haws, [facility] as Pharmacist (Pharmacist)  Recent office visits: 12/23/2020 - Henry Brigham, LPN - Patient presented for Annual Wellness Visit.   07/27/20 Dr Damita Dunnings OV: hospital f/u (fall/facial bleeding); referred to PT  Recent consult visits: 02/08/20 Dr Dwyane Dee (endocrine): f/u DM. On omnipod insulin pump w/ Humalog. Advised Freestyle [place]. F/U 4 months. 12/24/2020 - Henry Ruths, MD - Cardiology - Patient presented for follow up for coronary artery disease. Labs: BMP. Start: losartan 25 mg daily 11/30/2020 Henry Guiles Tat, DO - Neurology - Patient presented for parkinson's disease. Referral to: Home Health. Start: carbidopa-levodopa ER (SINEMET CR) 50-200 MG tablet - Take at bedtime. This is to be taken in conjunction with carbidopa/levodopa 25/100 CR, currently taking 2 tablets up to 5 times per day (generally takes 9 or 10 tablets/day). 11/11/2020 - Henry [name], DPM - Podiatry - Patient presented for thickened toenails. Procedure: debrided nailbeds 1-5 both feet no iatrogenic bleeding . 10/07/2020 - Henry Snare, MD - Endocrinology - Patient presented for diabetes. Labs: BMP, A1c and Microalbumin. No medication changes.  09/29/2020 - Henry Peru, MD -  Cardiology - Patient presented for follow up for pacemaker function. Normal device function. No medication changes.   Hospital visits: None in previous 6 months   Objective:  Lab Results  Component  Value Date   CREATININE 1.38 02/04/2021   BUN 22 02/04/2021   GFR 52.17 (L) 02/04/2021   GFRNONAA 53 (L) 09/25/2019   GFRAA >60 09/25/2019   NA 142 02/04/2021   K 4.1 02/04/2021   CALCIUM 8.9 02/04/2021   CO2 34 (H) 02/04/2021   GLUCOSE 155 (H) 02/04/2021    Lab Results  Component Value Date/Time   HGBA1C 6.5 02/04/2021 01:33 PM   HGBA1C 6.3 10/04/2020 09:30 AM   FRUCTOSAMINE 359 (H) 07/01/2019 01:37 PM   FRUCTOSAMINE 277 11/09/2017 08:07 AM   GFR 52.17 (L) 02/04/2021 01:33 PM   GFR 49.69 (L) 10/04/2020 09:30 AM   MICROALBUR <0.7 02/04/2021 01:33 PM   MICROALBUR <0.7 12/19/2019 10:36 AM    Last diabetic Eye exam:  Lab Results  Component Value Date/Time   HMDIABEYEEXA Retinopathy (A) 02/26/2020 12:00 AM   HMDIABEYEEXA No Retinopathy 02/26/2020 12:00 AM    Last diabetic Foot exam: No results found for: HMDIABFOOTEX   Lab Results  Component Value Date   CHOL 110 06/24/2020   HDL 32.30 (L) 06/24/2020   LDLCALC 47 06/24/2020   LDLDIRECT 51.0 04/24/2019   TRIG 150.0 (H) 06/24/2020   CHOLHDL 3 06/24/2020    Hepatic Function Latest Ref Rng & Units 06/24/2020 05/24/2020 08/16/2018  Total Protein 6.0 - 8.3 g/dL 6.3 7.1 6.7  Albumin 3.5 - 5.2 g/dL 4.1 4.3 4.4  AST 0 - 37 U/L $Remo'5 6 8  'slldb$ ALT 0 - 53 U/L $Remo'3 4 8  'ttUmN$ Alk Phosphatase 39 - 117 U/L 87 72 104  Total Bilirubin 0.2 - 1.2 mg/dL 0.6 1.1 0.5  Bilirubin, Direct 0.0 - 0.3 mg/dL - 0.3 -    Lab Results  Component Value Date/Time   TSH 3.09 06/24/2020 08:55 AM   TSH 2.13 03/24/2020 08:29 AM   FREET4 0.77 04/24/2019 09:25 AM   FREET4 0.88 08/16/2018 09:16 AM    CBC Latest Ref Rng & Units 05/24/2020 09/25/2019 08/19/2019  WBC 4.0 - 10.5 K/uL 12.2(H) 6.2 6.4  Hemoglobin 13.0 - 17.0 g/dL 14.8 13.9 13.2  Hematocrit 39.0 - 52.0 % 44.0 43.7 42.2  Platelets 150.0 - 400.0 K/uL 121.0(L) 195 195    Lab Results  Component Value Date/Time   VD25OH 27.9 06/25/2017 12:00 AM    Clinical ASCVD: Yes  The ASCVD Risk score (Arnett DK, et al.,  2019) failed to calculate for the following reasons:   The valid total cholesterol range is 130 to 320 mg/dL    Depression screen [facility] 2/9 12/23/2020 08/16/2018  Decreased Interest 0 0  Down, Depressed, Hopeless 0 0  PHQ - 2 Score 0 0  Altered sleeping - 3  Tired, decreased energy - 0  Change in appetite - 0  Feeling bad or failure about yourself  - 0  Trouble concentrating - 0  Moving slowly or fidgety/restless - 0  Suicidal thoughts - 0  PHQ-9 Score - 3  Difficult doing work/chores - Not difficult at all  Some recent data might be hidden     Social History   Tobacco Use  Smoking Status Never  Smokeless Tobacco Never   BP Readings from Last 3 Encounters:  02/07/21 118/82  12/24/20 130/61  11/30/20 137/62   Pulse Readings from Last 3 Encounters:  02/07/21 73  12/24/20 81  11/30/20  76   Wt Readings from Last 3 Encounters:  02/07/21 265 lb 6.4 oz (120.4 kg)  12/24/20 265 lb (120.2 kg)  12/23/20 260 lb (117.9 kg)   BMI Readings from Last 3 Encounters:  02/07/21 40.35 kg/m  12/24/20 39.13 kg/m  12/23/20 38.40 kg/m    Assessment/Interventions: Review of patient past medical history, allergies, medications, health status, including review of consultants reports, laboratory and other test data, was performed as part of comprehensive evaluation and provision of chronic care management services.   SDOH:  (Social Determinants of Health) assessments and interventions performed: No - completed at [facility] 12/2020  SDOH Screenings   Alcohol Screen: Low Risk    Last Alcohol Screening Score (AUDIT): 0  Depression (PHQ2-9): Low Risk    PHQ-2 Score: 0  Financial Resource Strain: Low Risk    Difficulty of Paying Living Expenses: Not hard at all  Food Insecurity: No Food Insecurity   Worried About Charity fundraiser in the Last Year: Never true   Ran Out of Food in the Last Year: Never true  Housing: Low Risk    Last Housing Risk Score: 0  Physical Activity: Insufficiently  Active   Days of Exercise per Week: 3 days   Minutes of Exercise per Session: 10 min  Social Connections: Engineer, building services of Communication with Friends and Family: Twice a week   Frequency of Social Gatherings with Friends and Family: More than three times a week   Attends Religious Services: More than 4 times per year   Active Member of [employer] or Organizations: Yes   Attends Music therapist: More than 4 times per year   Marital Status: Married  Stress: No Stress Concern Present   Feeling of Stress : Not at all  Tobacco Use: Low Risk    Smoking Tobacco Use: Never   Smokeless Tobacco Use: Never   Passive Exposure: Not on file  Transportation Needs: No Transportation Needs   Lack of Transportation (Medical): No   Lack of Transportation (Non-Medical): No    CCM Care Plan  Allergies  Allergen Reactions   Penicillins Anaphylaxis and Rash    Because of a history of documented adverse serious drug reaction;Medi Alert bracelet  is recommended PATIENT HAS HAD A PCN REACTION WITH IMMEDIATE RASH, FACIAL/TONGUE/THROAT SWELLING, SOB, OR LIGHTHEADEDNESS WITH HYPOTENSION:  #  #  YES  #  #  Has patient had a PCN reaction causing severe rash involving mucus membranes or skin necrosis: unknown Has patient had a PCN reaction that required hospitalization NO Has patient had a PCN reaction occurring within the last 10 years: NO   Klonopin [Clonazepam] Other (See Comments)    agitation   Peanut-Containing Drug Products Cough   Watermelon [Citrullus Vulgaris] Other (See Comments) and Cough    Tickle in throat    Medications Reviewed Today     Reviewed by Charlton Haws, [facility] (Pharmacist) on 02/08/21 at 1234  Med List Status: <None>   Medication Order Taking? Sig Documenting Provider Last Dose Status Informant  acetaminophen (TYLENOL) 500 MG tablet [id number] Yes Take 1,000 mg by mouth at bedtime as needed for mild pain or headache.  [provider] Taking  Active Spouse/Significant Other  albuterol (VENTOLIN HFA) 108 (90 Base) MCG/ACT inhaler [id number] Yes Inhale 2 puffs into the lungs every 6 (six) hours as needed for wheezing or shortness of breath. Tonia Ghent, MD Taking Active   allopurinol (ZYLOPRIM) 100 MG tablet [id number] Yes TAKE 1 &  1/2 TABLET BY MOUTH DAILY Tonia Ghent, MD Taking Active   aspirin EC 81 MG tablet [id number] Yes Take 1 tablet (81 mg total) by mouth daily. Swallow whole. Lelon Perla, MD Taking Active   carbidopa-levodopa (SINEMET CR) 50-200 MG tablet [id number] Yes TAKE 1 TABLET BY MOUTH EVERYDAY AT BEDTIME [name], Eustace Quail, DO Taking Active   Carbidopa-Levodopa ER (SINEMET CR) 25-100 MG tablet controlled release [id number] Yes 2 po 5 times per day as directed [name], Eustace Quail, DO Taking Active   Cholecalciferol (VITAMIN D) 50 MCG (2000 UT) tablet [id number] Yes Take 2,000 Units by mouth daily.  [provider] Taking Active Spouse/Significant Other  colchicine 0.6 MG tablet [id number] Yes Take 1 tablet (0.6 mg total) by mouth daily as needed (gout). Tonia Ghent, MD Taking Active   dorzolamide-timolol (COSOPT) 22.3-6.8 MG/ML ophthalmic solution [id number] Yes Place 1 drop into the right eye 2 (two) times daily. [provider] Taking Active Spouse/Significant Other  fluticasone-salmeterol (ADVAIR DISKUS) 250-50 MCG/ACT AEPB [id number] Yes Inhale 1 puff into the lungs in the morning and at bedtime. Rinse after use. Tonia Ghent, MD Taking Active   FREESTYLE LITE test strip [id number] Yes USE TO CHECK BLOOD SUGAR 4 TIMES DAILY. Henry Snare, MD Taking Active   furosemide (LASIX) 80 MG tablet [id number] Yes TAKE 1 TABLET BY MOUTH EVERY DAY Tonia Ghent, MD Taking Active   gabapentin (NEURONTIN) 100 MG capsule [id number] Yes TAKE 2 CAPSULES BY MOUTH EVERY MORNING AND TAKE 4 CAPSULES BY MOUTH EVERY EVENING Tonia Ghent, MD Taking Active   HUMALOG 100 UNIT/ML injection [id number] Yes USE MAXIMUM 76  UNITS PER   DAY WITH V-GO PUMP Henry Snare, MD Taking Active   Insulin Disposable Pump (OMNIPOD DASH SYSTEM) KIT [id number] Yes 1 each by Does not apply route. Use Dash system for Continuous Blood Glucose Monitoring. [provider] Taking Active Spouse/Significant Other  levothyroxine (SYNTHROID) 137 MCG tablet [id number] Yes TAKE 1 TABLET BY MOUTH DAILY BEFORE BREAKFAST. Henry Snare, MD Taking Active   losartan (COZAAR) 25 MG tablet [id number] Yes Take 1 tablet (25 mg total) by mouth daily. Lelon Perla, MD Taking Active   metoprolol succinate (TOPROL-XL) 50 MG 24 hr tablet [id number] Yes TAKE 1 TABLET BY MOUTH DAILY. TAKE WITH OR IMMEDIATELY FOLLOWING A MEAL. Tonia Ghent, MD Taking Active   MYRBETRIQ 25 MG TB24 tablet [id number] Yes TAKE 1 TABLET BY MOUTH EVERY DAY Tonia Ghent, MD Taking Active   nitroGLYCERIN (NITROSTAT) 0.4 MG SL tablet [id number]  Place 1 tablet (0.4 mg total) under the tongue every 5 (five) minutes as needed for chest pain. Lelon Perla, MD  Expired 01/28/20 2359   omega-3 acid ethyl esters (LOVAZA) 1 g capsule [id number] Yes TAKE 2 CAPSULES BY MOUTH TWICE A DAY Henry Snare, MD Taking Active   OVER THE COUNTER MEDICATION [id number] Yes Apply 1 application topically daily as needed (pain). Theraworx Pain Cream [provider] Taking Active Spouse/Significant Other  OZEMPIC, 1 MG/DOSE, 4 MG/3ML SOPN [id number] Yes INJECT $RemoveBe'1MG'[id number]$  SUBCUTANEOUSLY  ONCE A WEEK. Henry Snare, MD Taking Active   polyethylene glycol [facility] / GLYCOLAX) packet [id number] Yes Take 8.5 g by mouth daily. [provider] Taking Active Spouse/Significant Other  potassium chloride SA (KLOR-CON) 20 MEQ tablet [id number] Yes TAKE 3 TABLETS (60 MEQ TOTAL) BY MOUTH DAILY. Tonia Ghent, MD Taking Active   Propylene Glycol [facility] BALANCE OP) [id number] Yes Place 1 drop into the left eye daily as  needed (dry eyes). Uses Systane gel apply to the eye as needed [provider]  Taking Active   rosuvastatin (CRESTOR) 20 MG tablet [id number] Yes TAKE 1 TABLET BY MOUTH EVERYDAY AT BEDTIME Henry Snare, MD Taking Active   traMADol (ULTRAM) 50 MG tablet [id number] Yes TAKE 1 TABLET BY MOUTH TWICE A DAY Tonia Ghent, MD Taking Active   vitamin B-12 (CYANOCOBALAMIN) 1000 MCG tablet [id number] Yes Take 1,000 mcg by mouth daily. [provider] Taking Active Spouse/Significant Other            Patient Active Problem List   Diagnosis Date Noted   Asthma    Skin lesion 05/30/2019   Colon cancer screening 08/25/2018   CAD (coronary artery disease) 08/25/2018   Diabetes mellitus type 2, uncontrolled 08/15/2018   Shoulder pain 10/07/2017   Thrombocytopenia ([place]) 07/08/2017   Gout, unspecified 06/06/2017   Dysuria 05/25/2017   Microscopic hematuria 05/25/2017   CKD (chronic kidney disease) stage 3, GFR 30-59 ml/min (HCC) 04/12/2017   Neuropathy 02/25/2017   Urinary frequency 09/19/2016   Constipation 11/27/2015   Medicare annual wellness visit, initial 09/23/2015   Advance care planning 09/23/2015   Spinal stenosis of lumbar region 01/26/2015   History of colonic polyps    Restless leg syndrome 08/11/2014   ICD- MDT, implanted 11/13/13 10/01/3005   Chronic systolic CHF (congestive heart failure) ([place]) 11/13/2013   Acute on chronic combined systolic and diastolic congestive heart failure, NYHA class 4 ([place]) 05/12/2013   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   Elevated PSA 12/04/2012   Other malaise and fatigue 12/04/2012   PD (Parkinson's disease) ([place]) 08/19/2012   OSA on CPAP 04/17/2012   Dysautonomia ([place]) 04/17/2012   Obesity, morbid ([place]) 04/17/2012   Hypothyroidism following radioiodine therapy 10/16/2011   DYSPNEA/SHORTNESS OF BREATH 11/09/2009   GERD 05/11/2009   IDDM (insulin dependent diabetes mellitus) ([place]) 04/26/2009   OTHER SPEC FORMS CHRONIC ISCHEMIC HEART DISEASE 04/24/2008   HYPERPLASIA PROSTATE UNS W/O UR OBST & OTH LUTS 09/02/2007    NEPHROLITHIASIS, HX OF 09/02/2007   HERNIA, VENTRAL 12/28/2006   Hyperlipidemia 08/31/2006   HTN (hypertension) 08/31/2006   Hx of CABG x 3 in 2000 08/31/2006    Immunization History  Administered Date(s) Administered   Fluad Quad(high Dose 65+) 09/18/2018, 10/28/2019   H1N1 01/22/2008   Influenza Split 12/22/2010, 11/07/2011   Influenza Whole 10/17/2007, 11/16/2008, 11/09/2009   Influenza,inj,Quad PF,6+ Mos 10/01/2014, 09/21/2015, 10/27/2016   Influenza-Unspecified 10/25/2012, 11/27/2013, 09/09/2017, 10/07/2020   PFIZER(Purple Top)SARS-COV-2 Vaccination 05/06/2019, 05/27/2019   Pneumococcal Conjugate-13 03/25/2015   Pneumococcal Polysaccharide-23 01/09/2010, 04/10/2017   Tdap 12/04/2012, 07/21/2020    Conditions to be addressed/monitored:  Hypertension, Hyperlipidemia, Diabetes, Heart Failure, Coronary Artery Disease, Asthma, Chronic Kidney Disease, Hypothyroidism, Overactive Bladder, BPH, and Gout  Care Plan : CCM Pharmacy Care Plan  Updates made by Charlton Haws, [place] since 02/08/2021 12:00 AM     Problem: Hypertension, Hyperlipidemia, Diabetes, Heart Failure, Coronary Artery Disease, Asthma, Chronic Kidney Disease, Hypothyroidism, Overactive Bladder, BPH, and Gout   Priority: High     Long-Range Goal: Disease mgmt   Start Date: 02/08/2021  Expected End Date: 02/08/2022  This Visit's Progress: On track  Priority: High  Note:   Current Barriers:  Unable to independently monitor therapeutic efficacy  Pharmacist Clinical Goal(s):  Patient will achieve adherence to monitoring guidelines and medication adherence to achieve therapeutic efficacy through collaboration with PharmD and provider.   Interventions: 1:1 collaboration with Tonia Ghent, MD regarding development and update  of comprehensive plan of care as evidenced by provider attestation and co-signature Inter-disciplinary care team collaboration (see longitudinal plan of care) Comprehensive medication  review performed; medication list updated in electronic medical record  Hypertension / Heart Failure (BP goal <130/80) -Not ideally controlled - BP at home is at goal per pt report, however pt is not taking losartan as prescribed due to diarrhea - losartan was started 12/2020 for HF guideline-directed therapy -Last ejection fraction: 40-45% (Date: 06/02/19) -HF type: Diastolic; NYHA Class: not on file -Hx OSA on CPAP -Current home readings: 119/76, 133/84, 106/68, 127/80, 120/77 -Current treatment: Furosemide 80 mg daily - Appropriate, Effective, Safe, Accessible Losartan 25 mg daily - stopped d/t diarrhea Metoprolol succinate 50 mg daily -Appropriate, Effective, Safe, Accessible Klor Con 20 mEq - 3 tab daily -Appropriate, Effective, Safe, Accessible -Medications previously tried: lisinopril, metolazone, spironolactone, Farxiga -Denies hypotensive/hypertensive symptoms -Educated on BP goals and benefits of medications for prevention of heart attack, stroke and kidney damage; -Educated on Proper diuretic administration and potassium supplementation -Discussed benefits of losartan in HF; since pt was not able to tolerate 25 mg advised he try 1/2 tablet after dinner; if he cannot tolerate this advised pt to contact cardiologist  Hyperlipidemia / CAD: (LDL goal < 70) -Controlled - LDL at goal; pt has never had to use nitroglycerin; he endorses compliance with statin -Hx CABGx3 2000; 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 -Current treatment: Rosuvastatin 20 mg daily HS- Appropriate, Effective, Safe, Accessible Lovaza 1 g - 2 cap BID -Appropriate, Effective, Safe, Accessible Nitroglycerin 0.4 mg SL prn -Appropriate, Effective, Safe, Accessible Aspirin 81 mg daily -Appropriate, Effective, Safe, Accessible -Medications previously tried: atorvastatin,  -Educated on Cholesterol goals; Benefits of statin for ASCVD risk reduction; -Recommended to continue current medication  Diabetes (A1c goal <7%) -Controlled - A1c is  at goal, pt denies low sugars; he does not check BG every day; he has been advised to try CGM per Dr Dwyane Dee, pt reports in previous years it was going to be >$60 per month - discussed coverage has improved for CGM over the years -Pt reports he typically boluses 45 units w/ breakfast, 55 units w/ lunch and 65 units w/ supper -Current home glucose readings fasting glucose: 139, 97, 122, 158, 91 post prandial glucose: n/a -Current medications: Humalog via Omnipod pump -Appropriate, Effective, Safe, Accessible Ozempic 1 mg weekly -Appropriate, Effective, Safe, Accessible -Medications previously tried: [place] (UTI), glimepiride, Janumet, metformin -Denies hypoglycemic/hyperglycemic symptoms -Current meal patterns: reduced to one helping of meals -Current exercise: limited d/t Parkinsons -Educated on A1c and blood sugar goals; Continuous glucose monitoring - lack of finger sticks, readings every 5 mint -Assessed pt finances - pt reports Ozempic is $40 per 90 days through mail order and insulin is covered -Advised pt to contact [facility] regarding Freestyle Libre -Recommended to continue current medication  Atrial Fibrillation (Goal: prevent stroke and major bleeding) -Controlled -CHADSVASC: 5; no anticoagulation per cardiology due to frequent falls w/ Parkinsons -Current treatment: Metoprolol succinate 50 mg daily -Appropriate, Effective, Safe, Accessible -Recommended to continue current medication  Asthma (Goal: control symptoms and prevent exacerbations) -Controlled - pt reports breathing is well controlled; he endorses using Advair twice daily and albuterol infrequently -Current treatment  Advair ([place]) Diskus 250-50 mcg/act 1 puff BID -Appropriate, Effective, Safe, Accessible Albuterol HFA prn - Appropriate, Effective, Safe, Accessible -Pulmonary function testing: not on file -Exacerbations requiring treatment in last 6 months: 0 -Patient reports consistent use of maintenance  inhaler -Frequency of rescue inhaler use: weekly -Counseled on Proper inhaler technique;  Differences between maintenance and rescue inhalers -Recommended to continue current medication  Parkinson's Disease (Goal: manage symptoms) -Controlled -s/p PT twice a week; s/p DBS; managed per neurology (Dr [name]) -Current treatment  Carbidopa-levodopa CR 50-200 mg daily HS - Appropriate, Effective, Safe, Accessible Carbidopa-levodopa CR 25-100 mg - 2 tab 5x daily -Appropriate, Effective, Safe, Accessible -Discussed benefits of Sinemet; advised pt to continue home PT exercises on his own now that sessions have ended -Recommended to continue current medication  Hypothyroidism (Goal: maintain TSH in goal range) -Controlled -s/p hyperthyroidism radioiodine treatment; managed per endocrine (Dr Dwyane Dee) -Current treatment  Levothyroxine 137 mg daily, 1.5 tab on Saturdays -Appropriate, Effective, Safe, Accessible -Recommended to continue current medication  Pain / Neuropathy (Goal: manage pain) -Controlled - per pt report -Current treatment  Gabapentin 100 mg - 2 cap AM, 4 cap PM -Appropriate, Effective, Safe, Accessible Tramadol 50 mg BID - Appropriate, Effective, Safe, Accessible Tylenol 500 mg PRN -Appropriate, Effective, Safe, Accessible Theraworx Pain cream -Appropriate, Effective, Safe, Accessible -Recommended to continue current medication  Gout (Goal: prevent flares) -Controlled - pt reports he uses colchicine PRN for flares; he does not remember his last flare -Current treatment  Allopurinol 100 mg - 1.5 tab daily -Appropriate, Effective, Safe, Accessible Colchicine 0.6 mg PRN - Appropriate, Effective, Safe, Accessible -Recommended to continue current medication  BPH / OAB (Goal: manage symptoms) -Controlled -Current treatment  Myrbetriq 25 mg daily AM -Appropriate, Effective, Safe, Accessible -Recommended to continue current medication  Health Maintenance -Vaccine gaps: Shingrix,  covid booster -Current therapy:  Vitamin B12 1000 mcg Vitamin D 2000 IU Dorzolamide-timolol (Cosopt) eye drops BID Systane eye drops Miralax -Patient is satisfied with current therapy and denies issues -Recommended to continue current medication  Patient Goals/Self-Care Activities Patient will:  - take medications as prescribed as evidenced by patient report and record review focus on medication adherence by pill box check glucose using Freestyle Libre, document, and provide at future appointments check blood pressure daily, document, and provide at future appointments -Call [facility] to request Freestyle Libre -Try losartan 1/2 tab after dinner; if not tolerated contact cardiologist      Medication Assistance: None required.  Patient affirms current coverage meets needs.  Compliance/Adherence/Medication fill history: Care Gaps: NONE  Star-Rating Drugs: Losartan - LF 12/24/20 x 90 ds; PDC 100% Rosuvastatin - LF 11/15/20 x 90 ds; [place] 100%  Patient's preferred pharmacy is:  [id number] [place] - [address] [place] [address] Phone: [phone] Fax: [phone]  [facility] [place] [place] [facility] [employer] [place] [address] [place] [address] Phone: [phone] Fax: [phone]  [facility], [place] [address] Phone: [phone] Fax: [phone]  Uses pill box? Yes Pt endorses 100% compliance  We discussed: Current pharmacy is preferred with insurance plan and patient is satisfied with pharmacy services Patient decided to: Continue current medication management strategy  Care Plan and Follow Up Patient Decision:  Patient agrees to Care Plan and Follow-up.  Plan: Telephone follow up appointment with care management team member scheduled for:  3  months  Charlene Brooke, PharmD, BCACP Clinical Pharmacist [facility] at [facility] [phone]

## 2021-02-08 NOTE — Patient Instructions (Signed)
Visit Information  Phone number for Pharmacist: [phone]  Thank you for meeting with me to discuss your medications! I look forward to working with you to achieve your health care goals. Below is a summary of what we talked about during the visit:   Goals Addressed             This Visit's Progress    Monitor and Manage My Blood Sugar-Diabetes Type 2       Timeframe:  Long-Range Goal Priority:  Medium Start Date:    02/08/21                         Expected End Date: 02/08/22                       Follow Up Date April 2023   - check blood sugar at prescribed times - check blood sugar if I feel it is too high or too low - take the blood sugar meter to all doctor visits -Call [facility] to request Freestyle Libre 2    Why is this important?   Checking your blood sugar at home helps to keep it from getting very high or very low.  Writing the results in a diary or log helps the doctor know how to care for you.  Your blood sugar log should have the time, date and the results.  Also, write down the amount of insulin or other medicine that you take.  Other information, like what you ate, exercise done and how you were feeling, will also be helpful.     Notes:         Care Plan : [place]  Updates made by Charlton Haws, RPH since 02/08/2021 12:00 AM     Problem: Hypertension, Hyperlipidemia, Diabetes, Heart Failure, Coronary Artery Disease, Asthma, Chronic Kidney Disease, Hypothyroidism, Overactive Bladder, BPH, and Gout   Priority: High     Long-Range Goal: Disease mgmt   Start Date: 02/08/2021  Expected End Date: 02/08/2022  This Visit's Progress: On track  Priority: High  Note:   Current Barriers:  Unable to independently monitor therapeutic efficacy  Pharmacist Clinical Goal(s):  Patient will achieve adherence to monitoring guidelines and medication adherence to achieve therapeutic efficacy through collaboration with PharmD and provider.    Interventions: 1:1 collaboration with Tonia Ghent, MD regarding development and update of comprehensive plan of care as evidenced by provider attestation and co-signature Inter-disciplinary care team collaboration (see longitudinal plan of care) Comprehensive medication review performed; medication list updated in electronic medical record  Hypertension / Heart Failure (BP goal <130/80) -Not ideally controlled - BP at home is at goal per pt report, however pt is not taking losartan as prescribed due to diarrhea - losartan was started 12/2020 for HF guideline-directed therapy -Last ejection fraction: 40-45% (Date: 06/02/19) -HF type: Diastolic; NYHA Class: not on file -Hx OSA on CPAP -Current home readings: 119/76, 133/84, 106/68, 127/80, 120/77 -Current treatment: Furosemide 80 mg daily - Appropriate, Effective, Safe, Accessible Losartan 25 mg daily - stopped d/t diarrhea Metoprolol succinate 50 mg daily -Appropriate, Effective, Safe, Accessible Klor Con 20 mEq - 3 tab daily -Appropriate, Effective, Safe, Accessible -Medications previously tried: lisinopril, metolazone, spironolactone, Farxiga -Denies hypotensive/hypertensive symptoms -Educated on BP goals and benefits of medications for prevention of heart attack, stroke and kidney damage; -Educated on Proper diuretic administration and potassium supplementation -Discussed benefits of losartan in HF; since pt was not  able to tolerate 25 mg advised he try 1/2 tablet after dinner; if he cannot tolerate this advised pt to contact cardiologist  Hyperlipidemia / CAD: (LDL goal < 70) -Controlled - LDL at goal; pt has never had to use nitroglycerin; he endorses compliance with statin -Hx CABGx3 2000; 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 -Current treatment: Rosuvastatin 20 mg daily HS- Appropriate, Effective, Safe, Accessible Lovaza 1 g - 2 cap BID -Appropriate, Effective, Safe, Accessible Nitroglycerin 0.4 mg SL prn -Appropriate, Effective, Safe,  Accessible Aspirin 81 mg daily -Appropriate, Effective, Safe, Accessible -Medications previously tried: atorvastatin,  -Educated on Cholesterol goals; Benefits of statin for ASCVD risk reduction; -Recommended to continue current medication  Diabetes (A1c goal <7%) -Controlled - A1c is at goal, pt denies low sugars; he does not check BG every day; he has been advised to try CGM per Dr Dwyane Dee, pt reports in previous years it was going to be >$60 per month - discussed coverage has improved for CGM over the years -Pt reports he typically boluses 45 units w/ breakfast, 55 units w/ lunch and 65 units w/ supper -Current home glucose readings fasting glucose: 139, 97, 122, 158, 91 post prandial glucose: n/a -Current medications: Humalog via Omnipod pump -Appropriate, Effective, Safe, Accessible Ozempic 1 mg weekly -Appropriate, Effective, Safe, Accessible -Medications previously tried: [place] (UTI), glimepiride, Janumet, metformin -Denies hypoglycemic/hyperglycemic symptoms -Current meal patterns: reduced to one helping of meals -Current exercise: limited d/t Parkinsons -Educated on A1c and blood sugar goals; Continuous glucose monitoring - lack of finger sticks, readings every 5 mint -Assessed pt finances - pt reports Ozempic is $40 per 90 days through mail order and insulin is covered -Advised pt to contact [facility] regarding Freestyle Libre -Recommended to continue current medication  Atrial Fibrillation (Goal: prevent stroke and major bleeding) -Controlled -CHADSVASC: 5; no anticoagulation per cardiology due to frequent falls w/ Parkinsons -Current treatment: Metoprolol succinate 50 mg daily -Appropriate, Effective, Safe, Accessible -Recommended to continue current medication  Asthma (Goal: control symptoms and prevent exacerbations) -Controlled - pt reports breathing is well controlled; he endorses using Advair twice daily and albuterol infrequently -Current treatment  Advair ([place]ela)  Diskus 250-50 mcg/act 1 puff BID -Appropriate, Effective, Safe, Accessible Albuterol HFA prn - Appropriate, Effective, Safe, Accessible -Pulmonary function testing: not on file -Exacerbations requiring treatment in last 6 months: 0 -Patient reports consistent use of maintenance inhaler -Frequency of rescue inhaler use: weekly -Counseled on Proper inhaler technique; Differences between maintenance and rescue inhalers -Recommended to continue current medication  Parkinson's Disease (Goal: manage symptoms) -Controlled -s/p PT twice a week; s/p DBS; managed per neurology (Dr Tat) -Current treatment  Carbidopa-levodopa CR 50-200 mg daily HS - Appropriate, Effective, Safe, Accessible Carbidopa-levodopa CR 25-100 mg - 2 tab 5x daily -Appropriate, Effective, Safe, Accessible -Discussed benefits of Sinemet; advised pt to continue home PT exercises on his own now that sessions have ended -Recommended to continue current medication  Hypothyroidism (Goal: maintain TSH in goal range) -Controlled -s/p hyperthyroidism radioiodine treatment; managed per endocrine (Dr Dwyane Dee) -Current treatment  Levothyroxine 137 mg daily, 1.5 tab on Saturdays -Appropriate, Effective, Safe, Accessible -Recommended to continue current medication  Pain / Neuropathy (Goal: manage pain) -Controlled - per pt report -Current treatment  Gabapentin 100 mg - 2 cap AM, 4 cap PM -Appropriate, Effective, Safe, Accessible Tramadol 50 mg BID - Appropriate, Effective, Safe, Accessible Tylenol 500 mg PRN -Appropriate, Effective, Safe, Accessible Theraworx Pain cream -Appropriate, Effective, Safe, Accessible -Recommended to continue current medication  Gout (Goal: prevent flares) -Controlled - pt  reports he uses colchicine PRN for flares; he does not remember his last flare -Current treatment  Allopurinol 100 mg - 1.5 tab daily -Appropriate, Effective, Safe, Accessible Colchicine 0.6 mg PRN - Appropriate, Effective, Safe,  Accessible -Recommended to continue current medication  BPH / OAB (Goal: manage symptoms) -Controlled -Current treatment  Myrbetriq 25 mg daily AM -Appropriate, Effective, Safe, Accessible -Recommended to continue current medication  Health Maintenance -Vaccine gaps: Shingrix, covid booster -Current therapy:  Vitamin B12 1000 mcg Vitamin D 2000 IU Dorzolamide-timolol (Cosopt) eye drops BID Systane eye drops Miralax -Patient is satisfied with current therapy and denies issues -Recommended to continue current medication  Patient Goals/Self-Care Activities Patient will:  - take medications as prescribed as evidenced by patient report and record review focus on medication adherence by pill box check glucose using Freestyle Libre, document, and provide at future appointments check blood pressure daily, document, and provide at future appointments -Call [facility] to request Freestyle Libre -Try losartan 1/2 tab after dinner; if not tolerated contact cardiologist      Mr. Sherrin was given information about Chronic Care Management services today including:  CCM service includes personalized support from designated clinical staff supervised by his physician, including individualized plan of care and coordination with other care providers 24/7 contact phone numbers for assistance for urgent and routine care needs. Standard insurance, coinsurance, copays and deductibles apply for chronic care management only during months in which we provide at least 20 minutes of these services. Most insurances cover these services at 100%, however patients may be responsible for any copay, coinsurance and/or deductible if applicable. This service may help you avoid the need for more expensive face-to-face services. Only one practitioner may furnish and bill the service in a calendar month. The patient may stop CCM services at any time (effective at the end of the month) by phone call to the office  staff.  Patient agreed to services and verbal consent obtained.   Patient verbalizes understanding of instructions and care plan provided today and agrees to view in [place]. Active MyChart status confirmed with patient.   Telephone follow up appointment with pharmacy team member scheduled for: 3 months  Charlene Brooke, PharmD, [facility] Clinical Pharmacist [facility] at [facility] [phone]

## 2021-02-10 DIAGNOSIS — I69820 Aphasia following other cerebrovascular disease: Secondary | ICD-10-CM | POA: Diagnosis not present

## 2021-02-10 DIAGNOSIS — G3184 Mild cognitive impairment, so stated: Secondary | ICD-10-CM | POA: Diagnosis not present

## 2021-02-10 DIAGNOSIS — G20 Parkinson's disease: Secondary | ICD-10-CM | POA: Diagnosis not present

## 2021-02-10 DIAGNOSIS — I130 Hypertensive heart and chronic kidney disease with heart failure and stage 1 through stage 4 chronic kidney disease, or unspecified chronic kidney disease: Secondary | ICD-10-CM | POA: Diagnosis not present

## 2021-02-10 DIAGNOSIS — I5042 Chronic combined systolic (congestive) and diastolic (congestive) heart failure: Secondary | ICD-10-CM | POA: Diagnosis not present

## 2021-02-10 DIAGNOSIS — I69891 Dysphagia following other cerebrovascular disease: Secondary | ICD-10-CM | POA: Diagnosis not present

## 2021-02-13 ENCOUNTER — Other Ambulatory Visit: Payer: Self-pay | Admitting: Family Medicine

## 2021-02-13 DIAGNOSIS — I509 Heart failure, unspecified: Secondary | ICD-10-CM

## 2021-02-14 ENCOUNTER — Other Ambulatory Visit (HOSPITAL_COMMUNITY): Payer: Self-pay

## 2021-02-14 ENCOUNTER — Telehealth: Payer: Self-pay

## 2021-02-14 NOTE — Telephone Encounter (Signed)
Patient Advocate Encounter   Received notification from [facility] that prior authorization for Omnipod 5 G6 kit is required by his/her insurance [employer].   PA submitted on 02/14/21  Key#: [id number]  Status is pending    [facility] will continue to follow:  Patient Advocate Fax: [phone]

## 2021-02-15 ENCOUNTER — Other Ambulatory Visit (HOSPITAL_COMMUNITY): Payer: Self-pay

## 2021-02-15 NOTE — Telephone Encounter (Signed)
Patient Advocate Encounter  Prior Authorization for Omnipod 5 G6 kit has been approved.    PA# [id number]  Effective dates: 01/15/21 through 02/14/22  RTS - need override code  Spoke with Pharmacy to Process.  Patient Advocate Fax: [phone]

## 2021-02-16 ENCOUNTER — Encounter: Payer: Self-pay | Admitting: Endocrinology

## 2021-02-16 DIAGNOSIS — E1165 Type 2 diabetes mellitus with hyperglycemia: Secondary | ICD-10-CM

## 2021-02-17 ENCOUNTER — Other Ambulatory Visit: Payer: Self-pay | Admitting: Endocrinology

## 2021-02-17 ENCOUNTER — Encounter: Payer: Self-pay | Admitting: Endocrinology

## 2021-02-17 ENCOUNTER — Other Ambulatory Visit: Payer: Self-pay | Admitting: Family Medicine

## 2021-02-17 DIAGNOSIS — E1165 Type 2 diabetes mellitus with hyperglycemia: Secondary | ICD-10-CM

## 2021-02-17 DIAGNOSIS — E890 Postprocedural hypothyroidism: Secondary | ICD-10-CM

## 2021-02-17 DIAGNOSIS — Z794 Long term (current) use of insulin: Secondary | ICD-10-CM

## 2021-02-17 MED ORDER — OMNIPOD DASH PODS (GEN 4) MISC: 2 refills | Status: DC

## 2021-02-17 NOTE — Telephone Encounter (Signed)
Refill request for GABAPENTIN 100 MG CAPSULE  LOV - 07/27/20 Next OV - not scheduled Last refill - 08/24/20 #540/1

## 2021-02-18 NOTE — Telephone Encounter (Signed)
I called patient and spoke with spouse informed that [facility] would need to send paperwork for the correct item. We did not receive fax for [place] system. She will reach out to [facility].

## 2021-02-21 ENCOUNTER — Encounter: Payer: Self-pay | Admitting: Endocrinology

## 2021-02-22 ENCOUNTER — Encounter: Payer: Self-pay | Admitting: Endocrinology

## 2021-02-22 DIAGNOSIS — E1165 Type 2 diabetes mellitus with hyperglycemia: Secondary | ICD-10-CM

## 2021-02-22 DIAGNOSIS — Z794 Long term (current) use of insulin: Secondary | ICD-10-CM

## 2021-02-24 ENCOUNTER — Other Ambulatory Visit (HOSPITAL_COMMUNITY): Payer: Self-pay

## 2021-02-24 MED ORDER — OMNIPOD 5 DEXG7G6 INTRO GEN 5 KIT: 1.0000 | PACK | Freq: Every day | 0 refills | Status: DC

## 2021-02-24 MED ORDER — DEXCOM G6 RECEIVER DEVI: 0 refills | Status: DC

## 2021-02-24 MED ORDER — DEXCOM G6 TRANSMITTER MISC: 3 refills | Status: DC

## 2021-02-24 MED ORDER — DEXCOM G6 SENSOR MISC: 3 refills | Status: DC

## 2021-02-24 MED ORDER — OMNIPOD 5 DEXG7G6 PODS GEN 5 MISC: 1.0000 | 3 refills | Status: DC

## 2021-02-25 DIAGNOSIS — I5042 Chronic combined systolic (congestive) and diastolic (congestive) heart failure: Secondary | ICD-10-CM | POA: Diagnosis not present

## 2021-02-25 DIAGNOSIS — G20 Parkinson's disease: Secondary | ICD-10-CM | POA: Diagnosis not present

## 2021-02-25 DIAGNOSIS — G3184 Mild cognitive impairment, so stated: Secondary | ICD-10-CM | POA: Diagnosis not present

## 2021-02-25 DIAGNOSIS — I69891 Dysphagia following other cerebrovascular disease: Secondary | ICD-10-CM | POA: Diagnosis not present

## 2021-02-25 DIAGNOSIS — I69820 Aphasia following other cerebrovascular disease: Secondary | ICD-10-CM | POA: Diagnosis not present

## 2021-02-25 DIAGNOSIS — I130 Hypertensive heart and chronic kidney disease with heart failure and stage 1 through stage 4 chronic kidney disease, or unspecified chronic kidney disease: Secondary | ICD-10-CM | POA: Diagnosis not present

## 2021-03-02 ENCOUNTER — Other Ambulatory Visit (HOSPITAL_COMMUNITY): Payer: Self-pay

## 2021-03-02 DIAGNOSIS — G3184 Mild cognitive impairment, so stated: Secondary | ICD-10-CM | POA: Diagnosis not present

## 2021-03-02 DIAGNOSIS — G20 Parkinson's disease: Secondary | ICD-10-CM | POA: Diagnosis not present

## 2021-03-02 DIAGNOSIS — N400 Enlarged prostate without lower urinary tract symptoms: Secondary | ICD-10-CM | POA: Diagnosis not present

## 2021-03-02 DIAGNOSIS — K219 Gastro-esophageal reflux disease without esophagitis: Secondary | ICD-10-CM | POA: Diagnosis not present

## 2021-03-02 DIAGNOSIS — E785 Hyperlipidemia, unspecified: Secondary | ICD-10-CM | POA: Diagnosis not present

## 2021-03-02 DIAGNOSIS — I252 Old myocardial infarction: Secondary | ICD-10-CM | POA: Diagnosis not present

## 2021-03-02 DIAGNOSIS — K5900 Constipation, unspecified: Secondary | ICD-10-CM | POA: Diagnosis not present

## 2021-03-02 DIAGNOSIS — G2581 Restless legs syndrome: Secondary | ICD-10-CM | POA: Diagnosis not present

## 2021-03-02 DIAGNOSIS — E1122 Type 2 diabetes mellitus with diabetic chronic kidney disease: Secondary | ICD-10-CM | POA: Diagnosis not present

## 2021-03-02 DIAGNOSIS — G4733 Obstructive sleep apnea (adult) (pediatric): Secondary | ICD-10-CM | POA: Diagnosis not present

## 2021-03-02 DIAGNOSIS — Z7985 Long-term (current) use of injectable non-insulin antidiabetic drugs: Secondary | ICD-10-CM | POA: Diagnosis not present

## 2021-03-02 DIAGNOSIS — E1140 Type 2 diabetes mellitus with diabetic neuropathy, unspecified: Secondary | ICD-10-CM | POA: Diagnosis not present

## 2021-03-02 DIAGNOSIS — J45909 Unspecified asthma, uncomplicated: Secondary | ICD-10-CM | POA: Diagnosis not present

## 2021-03-02 DIAGNOSIS — I429 Cardiomyopathy, unspecified: Secondary | ICD-10-CM | POA: Diagnosis not present

## 2021-03-02 DIAGNOSIS — N183 Chronic kidney disease, stage 3 unspecified: Secondary | ICD-10-CM | POA: Diagnosis not present

## 2021-03-02 DIAGNOSIS — E039 Hypothyroidism, unspecified: Secondary | ICD-10-CM | POA: Diagnosis not present

## 2021-03-02 DIAGNOSIS — H409 Unspecified glaucoma: Secondary | ICD-10-CM | POA: Diagnosis not present

## 2021-03-02 DIAGNOSIS — I69820 Aphasia following other cerebrovascular disease: Secondary | ICD-10-CM | POA: Diagnosis not present

## 2021-03-02 DIAGNOSIS — I2510 Atherosclerotic heart disease of native coronary artery without angina pectoris: Secondary | ICD-10-CM | POA: Diagnosis not present

## 2021-03-02 DIAGNOSIS — I5042 Chronic combined systolic (congestive) and diastolic (congestive) heart failure: Secondary | ICD-10-CM | POA: Diagnosis not present

## 2021-03-02 DIAGNOSIS — I130 Hypertensive heart and chronic kidney disease with heart failure and stage 1 through stage 4 chronic kidney disease, or unspecified chronic kidney disease: Secondary | ICD-10-CM | POA: Diagnosis not present

## 2021-03-02 DIAGNOSIS — I69891 Dysphagia following other cerebrovascular disease: Secondary | ICD-10-CM | POA: Diagnosis not present

## 2021-03-02 DIAGNOSIS — M109 Gout, unspecified: Secondary | ICD-10-CM | POA: Diagnosis not present

## 2021-03-02 DIAGNOSIS — Z794 Long term (current) use of insulin: Secondary | ICD-10-CM | POA: Diagnosis not present

## 2021-03-02 NOTE — Telephone Encounter (Signed)
[facility] pharmacies called because they were trying to process the patient's omnipod 5 kit but it states PA needed. Informed that PA has been approved. Gave PA# and still stating PA needs to be done for kit. Any idea of why? Looks like it was approved. Are we sure the approval is for Kit and not pods?

## 2021-03-02 NOTE — Telephone Encounter (Signed)
Approval has been updated for the Kit from 1.23.23 to 2.23.24. It can now be filled with [facility]

## 2021-03-03 DIAGNOSIS — H35341 Macular cyst, hole, or pseudohole, right eye: Secondary | ICD-10-CM | POA: Diagnosis not present

## 2021-03-03 DIAGNOSIS — H4031X3 Glaucoma secondary to eye trauma, right eye, severe stage: Secondary | ICD-10-CM | POA: Diagnosis not present

## 2021-03-03 DIAGNOSIS — E119 Type 2 diabetes mellitus without complications: Secondary | ICD-10-CM | POA: Diagnosis not present

## 2021-03-03 DIAGNOSIS — H40032 Anatomical narrow angle, left eye: Secondary | ICD-10-CM | POA: Diagnosis not present

## 2021-03-03 DIAGNOSIS — H02401 Unspecified ptosis of right eyelid: Secondary | ICD-10-CM | POA: Diagnosis not present

## 2021-03-03 DIAGNOSIS — H35371 Puckering of macula, right eye: Secondary | ICD-10-CM | POA: Diagnosis not present

## 2021-03-03 LAB — HM DIABETES EYE EXAM

## 2021-03-07 NOTE — Telephone Encounter (Signed)
Received letter from [place] that patient Rx has been forwarded to [facility]. Received fax from [facility] that they needed clinical notes. They were faxed today.

## 2021-03-08 ENCOUNTER — Other Ambulatory Visit: Payer: Self-pay | Admitting: Endocrinology

## 2021-03-09 ENCOUNTER — Encounter: Payer: Self-pay | Admitting: Cardiology

## 2021-03-09 ENCOUNTER — Telehealth: Payer: Self-pay

## 2021-03-09 DIAGNOSIS — I130 Hypertensive heart and chronic kidney disease with heart failure and stage 1 through stage 4 chronic kidney disease, or unspecified chronic kidney disease: Secondary | ICD-10-CM | POA: Diagnosis not present

## 2021-03-09 DIAGNOSIS — I5042 Chronic combined systolic (congestive) and diastolic (congestive) heart failure: Secondary | ICD-10-CM | POA: Diagnosis not present

## 2021-03-09 DIAGNOSIS — I69820 Aphasia following other cerebrovascular disease: Secondary | ICD-10-CM | POA: Diagnosis not present

## 2021-03-09 DIAGNOSIS — G3184 Mild cognitive impairment, so stated: Secondary | ICD-10-CM | POA: Diagnosis not present

## 2021-03-09 DIAGNOSIS — I69891 Dysphagia following other cerebrovascular disease: Secondary | ICD-10-CM | POA: Diagnosis not present

## 2021-03-09 DIAGNOSIS — R1310 Dysphagia, unspecified: Secondary | ICD-10-CM

## 2021-03-09 DIAGNOSIS — G20 Parkinson's disease: Secondary | ICD-10-CM | POA: Diagnosis not present

## 2021-03-09 NOTE — Telephone Encounter (Signed)
I need extra detail here.  I haven't seen him since 07/27/20.   ?I can work on the orders to extend his ST if already started.  However, he'll likely need OV if this is a new start.   ?Please schedule when possible and please send the order for barium swallow.  Thanks.  ?

## 2021-03-09 NOTE — Telephone Encounter (Signed)
Needs order faxed to [facility] for modified barium swallow at [phone]. Must say Modified Barium Swallow  for dysphagia. ? ?Also, needs Verbal order for ST effective 03-14-21 2x week  for 2 week and 1 x week for 1 week. [phone] ?

## 2021-03-10 NOTE — Telephone Encounter (Signed)
LMTCB

## 2021-03-11 NOTE — Telephone Encounter (Addendum)
Received call back from [place]. This is extension of speech therapy as directed oked orders let her know patient is due for follow up in office. Orders are still needed to be placed for Modified Barium swallow faxed to number on first messages.  ?

## 2021-03-14 DIAGNOSIS — I5022 Chronic systolic (congestive) heart failure: Secondary | ICD-10-CM | POA: Diagnosis not present

## 2021-03-14 DIAGNOSIS — I2510 Atherosclerotic heart disease of native coronary artery without angina pectoris: Secondary | ICD-10-CM | POA: Diagnosis not present

## 2021-03-14 NOTE — Telephone Encounter (Signed)
Please send the order for modified barium swallow at [phone], stating Modified Barium Swallow  for dysphagia. ?

## 2021-03-15 LAB — BASIC METABOLIC PANEL
BUN/Creatinine Ratio: 14 (ref 10–24)
BUN: 17 mg/dL (ref 8–27)
CO2: 27 mmol/L (ref 20–29)
Calcium: 9.2 mg/dL (ref 8.6–10.2)
Chloride: 99 mmol/L (ref 96–106)
Creatinine, Ser: 1.21 mg/dL (ref 0.76–1.27)
Glucose: 172 mg/dL — ABNORMAL HIGH (ref 70–99)
Potassium: 4.2 mmol/L (ref 3.5–5.2)
Sodium: 140 mmol/L (ref 134–144)
eGFR: 65 mL/min/{1.73_m2} (ref >59)

## 2021-03-16 DIAGNOSIS — I5042 Chronic combined systolic (congestive) and diastolic (congestive) heart failure: Secondary | ICD-10-CM | POA: Diagnosis not present

## 2021-03-16 DIAGNOSIS — I69891 Dysphagia following other cerebrovascular disease: Secondary | ICD-10-CM | POA: Diagnosis not present

## 2021-03-16 DIAGNOSIS — I130 Hypertensive heart and chronic kidney disease with heart failure and stage 1 through stage 4 chronic kidney disease, or unspecified chronic kidney disease: Secondary | ICD-10-CM | POA: Diagnosis not present

## 2021-03-16 DIAGNOSIS — G20 Parkinson's disease: Secondary | ICD-10-CM | POA: Diagnosis not present

## 2021-03-16 DIAGNOSIS — G3184 Mild cognitive impairment, so stated: Secondary | ICD-10-CM | POA: Diagnosis not present

## 2021-03-16 DIAGNOSIS — I69820 Aphasia following other cerebrovascular disease: Secondary | ICD-10-CM | POA: Diagnosis not present

## 2021-03-16 NOTE — Telephone Encounter (Signed)
Order has been done and faxed to [facility] as requested ?

## 2021-03-16 NOTE — Addendum Note (Signed)
Addended by: Sherrilee Gilles B on: 03/16/2021 05:07 PM ? ? Modules accepted: Orders ? ?

## 2021-03-17 ENCOUNTER — Other Ambulatory Visit (HOSPITAL_COMMUNITY): Payer: Self-pay

## 2021-03-17 DIAGNOSIS — R059 Cough, unspecified: Secondary | ICD-10-CM

## 2021-03-17 DIAGNOSIS — R1310 Dysphagia, unspecified: Secondary | ICD-10-CM

## 2021-03-18 DIAGNOSIS — I5042 Chronic combined systolic (congestive) and diastolic (congestive) heart failure: Secondary | ICD-10-CM | POA: Diagnosis not present

## 2021-03-18 DIAGNOSIS — G3184 Mild cognitive impairment, so stated: Secondary | ICD-10-CM | POA: Diagnosis not present

## 2021-03-18 DIAGNOSIS — G20 Parkinson's disease: Secondary | ICD-10-CM | POA: Diagnosis not present

## 2021-03-18 DIAGNOSIS — I69820 Aphasia following other cerebrovascular disease: Secondary | ICD-10-CM | POA: Diagnosis not present

## 2021-03-18 DIAGNOSIS — I130 Hypertensive heart and chronic kidney disease with heart failure and stage 1 through stage 4 chronic kidney disease, or unspecified chronic kidney disease: Secondary | ICD-10-CM | POA: Diagnosis not present

## 2021-03-18 DIAGNOSIS — I69891 Dysphagia following other cerebrovascular disease: Secondary | ICD-10-CM | POA: Diagnosis not present

## 2021-03-22 DIAGNOSIS — I130 Hypertensive heart and chronic kidney disease with heart failure and stage 1 through stage 4 chronic kidney disease, or unspecified chronic kidney disease: Secondary | ICD-10-CM | POA: Diagnosis not present

## 2021-03-22 DIAGNOSIS — I69820 Aphasia following other cerebrovascular disease: Secondary | ICD-10-CM | POA: Diagnosis not present

## 2021-03-22 DIAGNOSIS — G20 Parkinson's disease: Secondary | ICD-10-CM | POA: Diagnosis not present

## 2021-03-22 DIAGNOSIS — I69891 Dysphagia following other cerebrovascular disease: Secondary | ICD-10-CM | POA: Diagnosis not present

## 2021-03-22 DIAGNOSIS — G3184 Mild cognitive impairment, so stated: Secondary | ICD-10-CM | POA: Diagnosis not present

## 2021-03-22 DIAGNOSIS — I5042 Chronic combined systolic (congestive) and diastolic (congestive) heart failure: Secondary | ICD-10-CM | POA: Diagnosis not present

## 2021-03-24 NOTE — Telephone Encounter (Signed)
Omnipod requested medical records for prior, visit notes, lab results for A1c, records for Diabetic Education, blood sugar logs, insulin --requirements, etc.   Sent to [facility] to [phone]. Sent to medical records to be faxed over ?

## 2021-03-25 ENCOUNTER — Encounter: Payer: Self-pay | Admitting: Endocrinology

## 2021-03-26 DIAGNOSIS — G20 Parkinson's disease: Secondary | ICD-10-CM | POA: Diagnosis not present

## 2021-03-26 DIAGNOSIS — I5042 Chronic combined systolic (congestive) and diastolic (congestive) heart failure: Secondary | ICD-10-CM | POA: Diagnosis not present

## 2021-03-26 DIAGNOSIS — G3184 Mild cognitive impairment, so stated: Secondary | ICD-10-CM | POA: Diagnosis not present

## 2021-03-26 DIAGNOSIS — I69820 Aphasia following other cerebrovascular disease: Secondary | ICD-10-CM | POA: Diagnosis not present

## 2021-03-26 DIAGNOSIS — I130 Hypertensive heart and chronic kidney disease with heart failure and stage 1 through stage 4 chronic kidney disease, or unspecified chronic kidney disease: Secondary | ICD-10-CM | POA: Diagnosis not present

## 2021-03-26 DIAGNOSIS — I69891 Dysphagia following other cerebrovascular disease: Secondary | ICD-10-CM | POA: Diagnosis not present

## 2021-03-29 ENCOUNTER — Other Ambulatory Visit (HOSPITAL_COMMUNITY): Payer: Self-pay

## 2021-03-29 ENCOUNTER — Telehealth: Payer: Self-pay | Admitting: Pharmacy Technician

## 2021-03-29 ENCOUNTER — Telehealth: Payer: Self-pay | Admitting: Family Medicine

## 2021-03-29 DIAGNOSIS — I69891 Dysphagia following other cerebrovascular disease: Secondary | ICD-10-CM | POA: Diagnosis not present

## 2021-03-29 DIAGNOSIS — I69820 Aphasia following other cerebrovascular disease: Secondary | ICD-10-CM | POA: Diagnosis not present

## 2021-03-29 DIAGNOSIS — G20 Parkinson's disease: Secondary | ICD-10-CM | POA: Diagnosis not present

## 2021-03-29 DIAGNOSIS — I130 Hypertensive heart and chronic kidney disease with heart failure and stage 1 through stage 4 chronic kidney disease, or unspecified chronic kidney disease: Secondary | ICD-10-CM | POA: Diagnosis not present

## 2021-03-29 DIAGNOSIS — G3184 Mild cognitive impairment, so stated: Secondary | ICD-10-CM | POA: Diagnosis not present

## 2021-03-29 DIAGNOSIS — I5042 Chronic combined systolic (congestive) and diastolic (congestive) heart failure: Secondary | ICD-10-CM | POA: Diagnosis not present

## 2021-03-29 NOTE — Telephone Encounter (Signed)
Home Health verbal orders ?Caller Name:Cathy ?Agency Name: [facility] ? ?Callback number: [phone] ? ?Requesting OT/PT/Skilled nursing/Social Work/Speech: ? ?Reason:Speech ? ?Frequency: 2X  4W  1X 2W ? ?Please forward to [facility] pool or providers CMA ? ?

## 2021-03-29 NOTE — Telephone Encounter (Signed)
Patient Advocate Encounter ? ?Received notification from [place] that prior authorization for OMNIPOD DASH PODS (GEN 4) is required. ?  ?PA submitted on 3.21.23 ?Key [id number] ?Status is pending ?  ?[facility] will continue to follow ? ?Stasha Naraine R Essam Lowdermilk, CPhT ?Patient Advocate ?[facility] ?Phone: [phone] ?Fax:  [phone] ? ?

## 2021-03-30 ENCOUNTER — Ambulatory Visit (HOSPITAL_COMMUNITY)
Admission: RE | Admit: 2021-03-30 | Discharge: 2021-03-30 | Disposition: A | Discharge status: Home / Self Care | Payer: Medicare Other | Source: Ambulatory Visit | Attending: Family Medicine | Admitting: Family Medicine

## 2021-03-30 ENCOUNTER — Other Ambulatory Visit: Payer: Self-pay

## 2021-03-30 DIAGNOSIS — R059 Cough, unspecified: Secondary | ICD-10-CM | POA: Insufficient documentation

## 2021-03-30 DIAGNOSIS — R1310 Dysphagia, unspecified: Secondary | ICD-10-CM | POA: Insufficient documentation

## 2021-03-30 NOTE — Telephone Encounter (Signed)
Called and spoke with [facility]  gave verbal okay for Speech Therapy . ?

## 2021-03-30 NOTE — Telephone Encounter (Signed)
Please give the order.  Thanks.   

## 2021-03-30 NOTE — Therapy (Signed)
Modified Barium Swallow Progress Note ? ?Patient Details  ?Name: Henry [name] ?MRN: [id number] ?Date of Birth: August 17, 1950 ? ?Today's Date: 03/30/2021 ? ?Modified Barium Swallow completed.  Full report located under Chart Review in the Imaging Section. ? ?Brief recommendations include the following: ? ?Clinical Impression ? Patient presents with a mild oral phase dysphagia, mild-moderate pharyngeal phase dysphagia and appearance of esophageal phase dysphagia. During oral phase, patient exhibited mild delayed anterior to posterior transit with puree and dysphagia 3 solids textures and mildly reduced mastication efficiency. During pharyngeal phase of swallow, patient exhibited swallow initiation delays to level of vallecular sinus with puree and dysphagia 3 texture solids and to level of vallecular as well as pyriform sinus at times with thin liquids and nectar thick liquids. Flash penetration during the sswallow observed with thin liquids (all liquids taken with straw sips) and nectar thick liquids. One instance of trace silent aspiration of thin liquids occurred during the swallow. Only mild to trace amount of residuals observed in vallecular sinus and pyriform sinus after bites of puree solids and mechanical soft solids but residuals fully cleared with subsequent swallows. Backflow of all tested barium consistencies observed, occuring almost immediately after inital swallow and resulting in barium pooling above UES and into pyriform sinus. Subsequent swallows did clear this barium however it was observed to occur consistently with majority of swallows. Patient does have h/o GERD and may benefit from esophageal assessment. SLP is recommending to continue current diet but to consider downgrade to nectar thick liquids if patient's frequency of coughing and/or other dysphagia symptoms persist or get worse. In addition, SLP is recommending patient sit upright for all PO's and to remain seated upright for at least 30-45  minutes after meals. Patient and spouse both informed of recommendations. ?  ?Swallow Evaluation Recommendations ? ? Recommended Consults: Consider esophageal assessment ? ? SLP Diet Recommendations: Regular solids;Dysphagia 3 (Mech soft) solids;Thin liquid ? ? Liquid Administration via: Cup;Straw ? ? Medication Administration: Whole meds with liquid ? ?   ? ? Compensations: Follow solids with liquid;Multiple dry swallows after each bite/sip;Slow rate;Small sips/bites ? ? Postural Changes: Seated upright at 90 degrees;Remain semi-upright after after feeds/meals (Comment) (30-45 minutes upright after PO's) ? ?   ? ?   ? ?Sonia Baller, MA, CCC-SLP ?Speech Therapy ? ? ?

## 2021-04-01 ENCOUNTER — Other Ambulatory Visit (HOSPITAL_COMMUNITY): Payer: Self-pay

## 2021-04-01 DIAGNOSIS — I69320 Aphasia following cerebral infarction: Secondary | ICD-10-CM | POA: Diagnosis not present

## 2021-04-01 DIAGNOSIS — G3184 Mild cognitive impairment, so stated: Secondary | ICD-10-CM | POA: Diagnosis not present

## 2021-04-01 DIAGNOSIS — E1122 Type 2 diabetes mellitus with diabetic chronic kidney disease: Secondary | ICD-10-CM | POA: Diagnosis not present

## 2021-04-01 DIAGNOSIS — I2510 Atherosclerotic heart disease of native coronary artery without angina pectoris: Secondary | ICD-10-CM | POA: Diagnosis not present

## 2021-04-01 DIAGNOSIS — N400 Enlarged prostate without lower urinary tract symptoms: Secondary | ICD-10-CM | POA: Diagnosis not present

## 2021-04-01 DIAGNOSIS — H409 Unspecified glaucoma: Secondary | ICD-10-CM | POA: Diagnosis not present

## 2021-04-01 DIAGNOSIS — K219 Gastro-esophageal reflux disease without esophagitis: Secondary | ICD-10-CM | POA: Diagnosis not present

## 2021-04-01 DIAGNOSIS — E1140 Type 2 diabetes mellitus with diabetic neuropathy, unspecified: Secondary | ICD-10-CM | POA: Diagnosis not present

## 2021-04-01 DIAGNOSIS — I69891 Dysphagia following other cerebrovascular disease: Secondary | ICD-10-CM | POA: Diagnosis not present

## 2021-04-01 DIAGNOSIS — Z794 Long term (current) use of insulin: Secondary | ICD-10-CM | POA: Diagnosis not present

## 2021-04-01 DIAGNOSIS — K5900 Constipation, unspecified: Secondary | ICD-10-CM | POA: Diagnosis not present

## 2021-04-01 DIAGNOSIS — I130 Hypertensive heart and chronic kidney disease with heart failure and stage 1 through stage 4 chronic kidney disease, or unspecified chronic kidney disease: Secondary | ICD-10-CM | POA: Diagnosis not present

## 2021-04-01 DIAGNOSIS — I5042 Chronic combined systolic (congestive) and diastolic (congestive) heart failure: Secondary | ICD-10-CM | POA: Diagnosis not present

## 2021-04-01 DIAGNOSIS — Z7985 Long-term (current) use of injectable non-insulin antidiabetic drugs: Secondary | ICD-10-CM | POA: Diagnosis not present

## 2021-04-01 DIAGNOSIS — I429 Cardiomyopathy, unspecified: Secondary | ICD-10-CM | POA: Diagnosis not present

## 2021-04-01 DIAGNOSIS — E785 Hyperlipidemia, unspecified: Secondary | ICD-10-CM | POA: Diagnosis not present

## 2021-04-01 DIAGNOSIS — G4733 Obstructive sleep apnea (adult) (pediatric): Secondary | ICD-10-CM | POA: Diagnosis not present

## 2021-04-01 DIAGNOSIS — M109 Gout, unspecified: Secondary | ICD-10-CM | POA: Diagnosis not present

## 2021-04-01 DIAGNOSIS — E039 Hypothyroidism, unspecified: Secondary | ICD-10-CM | POA: Diagnosis not present

## 2021-04-01 DIAGNOSIS — N183 Chronic kidney disease, stage 3 unspecified: Secondary | ICD-10-CM | POA: Diagnosis not present

## 2021-04-01 DIAGNOSIS — J45909 Unspecified asthma, uncomplicated: Secondary | ICD-10-CM | POA: Diagnosis not present

## 2021-04-01 DIAGNOSIS — I252 Old myocardial infarction: Secondary | ICD-10-CM | POA: Diagnosis not present

## 2021-04-01 DIAGNOSIS — G20 Parkinson's disease: Secondary | ICD-10-CM | POA: Diagnosis not present

## 2021-04-01 DIAGNOSIS — G2581 Restless legs syndrome: Secondary | ICD-10-CM | POA: Diagnosis not present

## 2021-04-01 NOTE — Telephone Encounter (Signed)
Patient Advocate Encounter ? ?Prior Authorization for Omnipod DASH pods has been approved.   ? ?PA# [id number] ? ?Effective dates: 03/30/21 through 03/31/22 ? ?Per Test Claim Patients co-pay is $392.(30 DS) ? ?Spoke with Pharmacy to Process. ? ?Patient Advocate ?Fax: [phone]  ?

## 2021-04-04 ENCOUNTER — Encounter: Payer: Self-pay | Admitting: Endocrinology

## 2021-04-05 ENCOUNTER — Encounter: Payer: Self-pay | Admitting: Endocrinology

## 2021-04-05 DIAGNOSIS — I69891 Dysphagia following other cerebrovascular disease: Secondary | ICD-10-CM | POA: Diagnosis not present

## 2021-04-05 DIAGNOSIS — I69320 Aphasia following cerebral infarction: Secondary | ICD-10-CM | POA: Diagnosis not present

## 2021-04-05 DIAGNOSIS — I130 Hypertensive heart and chronic kidney disease with heart failure and stage 1 through stage 4 chronic kidney disease, or unspecified chronic kidney disease: Secondary | ICD-10-CM | POA: Diagnosis not present

## 2021-04-05 DIAGNOSIS — I5042 Chronic combined systolic (congestive) and diastolic (congestive) heart failure: Secondary | ICD-10-CM | POA: Diagnosis not present

## 2021-04-05 DIAGNOSIS — G20 Parkinson's disease: Secondary | ICD-10-CM | POA: Diagnosis not present

## 2021-04-05 DIAGNOSIS — G3184 Mild cognitive impairment, so stated: Secondary | ICD-10-CM | POA: Diagnosis not present

## 2021-04-06 NOTE — Telephone Encounter (Signed)
Patient contacted on another encounter ? ?

## 2021-04-07 ENCOUNTER — Other Ambulatory Visit: Payer: Self-pay | Admitting: Neurology

## 2021-04-07 ENCOUNTER — Encounter: Payer: Self-pay | Admitting: Family Medicine

## 2021-04-07 ENCOUNTER — Ambulatory Visit (INDEPENDENT_AMBULATORY_CARE_PROVIDER_SITE_OTHER): Payer: Medicare Other | Admitting: Family Medicine

## 2021-04-07 DIAGNOSIS — K429 Umbilical hernia without obstruction or gangrene: Secondary | ICD-10-CM

## 2021-04-07 DIAGNOSIS — I255 Ischemic cardiomyopathy: Secondary | ICD-10-CM | POA: Diagnosis not present

## 2021-04-07 DIAGNOSIS — G20 Parkinson's disease: Secondary | ICD-10-CM

## 2021-04-07 DIAGNOSIS — G20A1 Parkinson's disease without dyskinesia, without mention of fluctuations: Secondary | ICD-10-CM

## 2021-04-07 NOTE — Patient Instructions (Addendum)
Keep offloading and using the doughnut pillow.   ?Update me if your swallowing is worse.   ?You could use an wide belt for the abdominal wall changes/umbilical hernia but you may not have to do that.  ?Take care.  Glad to see you. ?

## 2021-04-07 NOTE — Progress Notes (Signed)
Swallow study d/w pt.  Some occ cough with swallowing.   He can manage as is, with routine cautions discussed with patient and he can notify me of any worsening symptoms.  Discussed recommendation below. ? ?SLP is recommending to continue current diet but to consider downgrade to nectar thick liquids if patient's frequency of coughing and/or other dysphagia symptoms persist or get worse. In addition, SLP is recommending patient sit upright for all PO's and to remain seated upright for at least 30-45 minutes after meals. ? ?Umbilical hernia and "pooching out" from R side of midline at the rectus.  Noted with sitting up.  He is not having bowel obstruction symptoms.  No fevers. ? ?Sacral irritation/discomfort.  Using topical drying agent and barrier cream.  He is trying to shift weight periodically.   ? ?Meds, vitals, and allergies reviewed.  ? ?ROS: Per HPI unless specifically indicated in ROS section  ? ?Ncat ?Neck supple, no LA ?Rrr ?Ctab ?Abd soft, not ttp.   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   ?Gluteal area examined with no ulceration but some postinflammatory changes noted.   ?Able to walk using rollator. ?

## 2021-04-08 DIAGNOSIS — G20 Parkinson's disease: Secondary | ICD-10-CM | POA: Diagnosis not present

## 2021-04-08 DIAGNOSIS — I5042 Chronic combined systolic (congestive) and diastolic (congestive) heart failure: Secondary | ICD-10-CM | POA: Diagnosis not present

## 2021-04-08 DIAGNOSIS — I130 Hypertensive heart and chronic kidney disease with heart failure and stage 1 through stage 4 chronic kidney disease, or unspecified chronic kidney disease: Secondary | ICD-10-CM | POA: Diagnosis not present

## 2021-04-08 DIAGNOSIS — I69891 Dysphagia following other cerebrovascular disease: Secondary | ICD-10-CM | POA: Diagnosis not present

## 2021-04-08 DIAGNOSIS — I69320 Aphasia following cerebral infarction: Secondary | ICD-10-CM | POA: Diagnosis not present

## 2021-04-08 DIAGNOSIS — G3184 Mild cognitive impairment, so stated: Secondary | ICD-10-CM | POA: Diagnosis not present

## 2021-04-10 NOTE — Assessment & Plan Note (Signed)
With swallowing changes noted.  Recommendations above discussed with patient.  He can update me as needed. ? ?Gait is affected and discussed sitting/offloading weight to prevent sacral irritation.  Continue using walker and he will update me as needed. ?

## 2021-04-10 NOTE — Assessment & Plan Note (Signed)
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  He could use a reinforcing belt if needed.  I do not think surgery would be a great idea, unless he had dramatically worse symptoms.  He agrees.  He can update me as needed. ?

## 2021-04-12 DIAGNOSIS — I69320 Aphasia following cerebral infarction: Secondary | ICD-10-CM | POA: Diagnosis not present

## 2021-04-12 DIAGNOSIS — G3184 Mild cognitive impairment, so stated: Secondary | ICD-10-CM | POA: Diagnosis not present

## 2021-04-12 DIAGNOSIS — I130 Hypertensive heart and chronic kidney disease with heart failure and stage 1 through stage 4 chronic kidney disease, or unspecified chronic kidney disease: Secondary | ICD-10-CM | POA: Diagnosis not present

## 2021-04-12 DIAGNOSIS — I5042 Chronic combined systolic (congestive) and diastolic (congestive) heart failure: Secondary | ICD-10-CM | POA: Diagnosis not present

## 2021-04-12 DIAGNOSIS — G20 Parkinson's disease: Secondary | ICD-10-CM | POA: Diagnosis not present

## 2021-04-12 DIAGNOSIS — I69891 Dysphagia following other cerebrovascular disease: Secondary | ICD-10-CM | POA: Diagnosis not present

## 2021-04-14 ENCOUNTER — Other Ambulatory Visit: Payer: Self-pay | Admitting: Family Medicine

## 2021-04-14 ENCOUNTER — Other Ambulatory Visit: Payer: Self-pay | Admitting: Endocrinology

## 2021-04-14 DIAGNOSIS — I69891 Dysphagia following other cerebrovascular disease: Secondary | ICD-10-CM | POA: Diagnosis not present

## 2021-04-14 DIAGNOSIS — I69320 Aphasia following cerebral infarction: Secondary | ICD-10-CM | POA: Diagnosis not present

## 2021-04-14 DIAGNOSIS — I5042 Chronic combined systolic (congestive) and diastolic (congestive) heart failure: Secondary | ICD-10-CM | POA: Diagnosis not present

## 2021-04-14 DIAGNOSIS — G3184 Mild cognitive impairment, so stated: Secondary | ICD-10-CM | POA: Diagnosis not present

## 2021-04-14 DIAGNOSIS — G20 Parkinson's disease: Secondary | ICD-10-CM | POA: Diagnosis not present

## 2021-04-14 DIAGNOSIS — I130 Hypertensive heart and chronic kidney disease with heart failure and stage 1 through stage 4 chronic kidney disease, or unspecified chronic kidney disease: Secondary | ICD-10-CM | POA: Diagnosis not present

## 2021-04-14 NOTE — Telephone Encounter (Signed)
Refill request for TRAMADOL HCL 50 MG TABLET ? ?LOV - 04/07/21 ?Next OV - not scheduled ?Last refill - 01/16/21 #60/2 ? ?

## 2021-04-19 DIAGNOSIS — G3184 Mild cognitive impairment, so stated: Secondary | ICD-10-CM | POA: Diagnosis not present

## 2021-04-19 DIAGNOSIS — I69891 Dysphagia following other cerebrovascular disease: Secondary | ICD-10-CM | POA: Diagnosis not present

## 2021-04-19 DIAGNOSIS — G20 Parkinson's disease: Secondary | ICD-10-CM | POA: Diagnosis not present

## 2021-04-19 DIAGNOSIS — I5042 Chronic combined systolic (congestive) and diastolic (congestive) heart failure: Secondary | ICD-10-CM | POA: Diagnosis not present

## 2021-04-19 DIAGNOSIS — I69320 Aphasia following cerebral infarction: Secondary | ICD-10-CM | POA: Diagnosis not present

## 2021-04-19 DIAGNOSIS — I130 Hypertensive heart and chronic kidney disease with heart failure and stage 1 through stage 4 chronic kidney disease, or unspecified chronic kidney disease: Secondary | ICD-10-CM | POA: Diagnosis not present

## 2021-04-20 ENCOUNTER — Ambulatory Visit (INDEPENDENT_AMBULATORY_CARE_PROVIDER_SITE_OTHER): Payer: Medicare Other

## 2021-04-20 DIAGNOSIS — I255 Ischemic cardiomyopathy: Secondary | ICD-10-CM | POA: Diagnosis not present

## 2021-04-20 LAB — CUP PACEART REMOTE DEVICE CHECK
Battery Remaining Longevity: 35 mo
Battery Voltage: 2.95 V
Brady Statistic AP VP Percent: 0 %
Brady Statistic AP VS Percent: 2.32 %
Brady Statistic AS VP Percent: 0.08 %
Brady Statistic AS VS Percent: 97.59 %
Brady Statistic RA Percent Paced: 2.32 %
Brady Statistic RV Percent Paced: 0.08 %
Date Time Interrogation Session: 20230412033623
HighPow Impedance: 71 Ohm
Implantable Lead Implant Date: 20151105
Implantable Lead Implant Date: 20151105
Implantable Lead Location: 753859
Implantable Lead Location: 753860
Implantable Lead Model: 5076
Implantable Pulse Generator Implant Date: 20151105
Lead Channel Impedance Value: 399 Ohm
Lead Channel Impedance Value: 456 Ohm
Lead Channel Impedance Value: 494 Ohm
Lead Channel Pacing Threshold Amplitude: 0.375 V
Lead Channel Pacing Threshold Amplitude: 1 V
Lead Channel Pacing Threshold Pulse Width: 0.4 ms
Lead Channel Pacing Threshold Pulse Width: 0.4 ms
Lead Channel Sensing Intrinsic Amplitude: 1.75 mV
Lead Channel Sensing Intrinsic Amplitude: 1.75 mV
Lead Channel Sensing Intrinsic Amplitude: 17.875 mV
Lead Channel Sensing Intrinsic Amplitude: 17.875 mV
Lead Channel Setting Pacing Amplitude: 2.25 V
Lead Channel Setting Pacing Amplitude: 2.5 V
Lead Channel Setting Pacing Pulse Width: 0.4 ms
Lead Channel Setting Sensing Sensitivity: 0.3 mV

## 2021-04-25 ENCOUNTER — Encounter: Payer: Self-pay | Admitting: Endocrinology

## 2021-04-27 DIAGNOSIS — G3184 Mild cognitive impairment, so stated: Secondary | ICD-10-CM | POA: Diagnosis not present

## 2021-04-27 DIAGNOSIS — I5042 Chronic combined systolic (congestive) and diastolic (congestive) heart failure: Secondary | ICD-10-CM | POA: Diagnosis not present

## 2021-04-27 DIAGNOSIS — I130 Hypertensive heart and chronic kidney disease with heart failure and stage 1 through stage 4 chronic kidney disease, or unspecified chronic kidney disease: Secondary | ICD-10-CM | POA: Diagnosis not present

## 2021-04-27 DIAGNOSIS — I69891 Dysphagia following other cerebrovascular disease: Secondary | ICD-10-CM | POA: Diagnosis not present

## 2021-04-27 DIAGNOSIS — I69320 Aphasia following cerebral infarction: Secondary | ICD-10-CM | POA: Diagnosis not present

## 2021-04-27 DIAGNOSIS — G20 Parkinson's disease: Secondary | ICD-10-CM | POA: Diagnosis not present

## 2021-05-01 DIAGNOSIS — Z7985 Long-term (current) use of injectable non-insulin antidiabetic drugs: Secondary | ICD-10-CM | POA: Diagnosis not present

## 2021-05-01 DIAGNOSIS — E785 Hyperlipidemia, unspecified: Secondary | ICD-10-CM | POA: Diagnosis not present

## 2021-05-01 DIAGNOSIS — I429 Cardiomyopathy, unspecified: Secondary | ICD-10-CM | POA: Diagnosis not present

## 2021-05-01 DIAGNOSIS — H409 Unspecified glaucoma: Secondary | ICD-10-CM | POA: Diagnosis not present

## 2021-05-01 DIAGNOSIS — G20 Parkinson's disease: Secondary | ICD-10-CM | POA: Diagnosis not present

## 2021-05-01 DIAGNOSIS — I69891 Dysphagia following other cerebrovascular disease: Secondary | ICD-10-CM | POA: Diagnosis not present

## 2021-05-01 DIAGNOSIS — K219 Gastro-esophageal reflux disease without esophagitis: Secondary | ICD-10-CM | POA: Diagnosis not present

## 2021-05-01 DIAGNOSIS — N400 Enlarged prostate without lower urinary tract symptoms: Secondary | ICD-10-CM | POA: Diagnosis not present

## 2021-05-01 DIAGNOSIS — I5042 Chronic combined systolic (congestive) and diastolic (congestive) heart failure: Secondary | ICD-10-CM | POA: Diagnosis not present

## 2021-05-01 DIAGNOSIS — E039 Hypothyroidism, unspecified: Secondary | ICD-10-CM | POA: Diagnosis not present

## 2021-05-01 DIAGNOSIS — K5900 Constipation, unspecified: Secondary | ICD-10-CM | POA: Diagnosis not present

## 2021-05-01 DIAGNOSIS — G2581 Restless legs syndrome: Secondary | ICD-10-CM | POA: Diagnosis not present

## 2021-05-01 DIAGNOSIS — I252 Old myocardial infarction: Secondary | ICD-10-CM | POA: Diagnosis not present

## 2021-05-01 DIAGNOSIS — E1122 Type 2 diabetes mellitus with diabetic chronic kidney disease: Secondary | ICD-10-CM | POA: Diagnosis not present

## 2021-05-01 DIAGNOSIS — N183 Chronic kidney disease, stage 3 unspecified: Secondary | ICD-10-CM | POA: Diagnosis not present

## 2021-05-01 DIAGNOSIS — E1140 Type 2 diabetes mellitus with diabetic neuropathy, unspecified: Secondary | ICD-10-CM | POA: Diagnosis not present

## 2021-05-01 DIAGNOSIS — G3184 Mild cognitive impairment, so stated: Secondary | ICD-10-CM | POA: Diagnosis not present

## 2021-05-01 DIAGNOSIS — G4733 Obstructive sleep apnea (adult) (pediatric): Secondary | ICD-10-CM | POA: Diagnosis not present

## 2021-05-01 DIAGNOSIS — I69320 Aphasia following cerebral infarction: Secondary | ICD-10-CM | POA: Diagnosis not present

## 2021-05-01 DIAGNOSIS — J45909 Unspecified asthma, uncomplicated: Secondary | ICD-10-CM | POA: Diagnosis not present

## 2021-05-01 DIAGNOSIS — M109 Gout, unspecified: Secondary | ICD-10-CM | POA: Diagnosis not present

## 2021-05-01 DIAGNOSIS — I130 Hypertensive heart and chronic kidney disease with heart failure and stage 1 through stage 4 chronic kidney disease, or unspecified chronic kidney disease: Secondary | ICD-10-CM | POA: Diagnosis not present

## 2021-05-01 DIAGNOSIS — Z794 Long term (current) use of insulin: Secondary | ICD-10-CM | POA: Diagnosis not present

## 2021-05-01 DIAGNOSIS — I2510 Atherosclerotic heart disease of native coronary artery without angina pectoris: Secondary | ICD-10-CM | POA: Diagnosis not present

## 2021-05-02 ENCOUNTER — Telehealth: Payer: Self-pay

## 2021-05-02 NOTE — Progress Notes (Signed)
? ? ?Chronic Care Management ?Pharmacy Assistant  ? ?Name: Henry [name]  MRN: [id number] DOB: 02/10/1950 ? ?Reason for Encounter: CCM Counsellor) ? ?Medications: ?Outpatient Encounter Medications as of 05/02/2021  ?Medication Sig  ? acetaminophen (TYLENOL) 500 MG tablet Take 1,000 mg by mouth at bedtime as needed for mild pain or headache.   ? albuterol (VENTOLIN HFA) 108 (90 Base) MCG/ACT inhaler Inhale 2 puffs into the lungs every 6 (six) hours as needed for wheezing or shortness of breath.  ? allopurinol (ZYLOPRIM) 100 MG tablet TAKE 1 & 1/2 TABLET BY MOUTH DAILY  ? aspirin EC 81 MG tablet Take 1 tablet (81 mg total) by mouth daily. Swallow whole.  ? carbidopa-levodopa (SINEMET CR) 50-200 MG tablet TAKE 1 TABLET BY MOUTH EVERYDAY AT BEDTIME  ? Carbidopa-Levodopa ER (SINEMET CR) 25-100 MG tablet controlled release 2 po 5 times per day as directed  ? Cholecalciferol (VITAMIN D) 50 MCG (2000 UT) tablet Take 2,000 Units by mouth daily.   ? colchicine 0.6 MG tablet Take 1 tablet (0.6 mg total) by mouth daily as needed (gout).  ? Continuous Blood Gluc Receiver (DEXCOM G6 RECEIVER) DEVI Use to check blood sugar daily  ? Continuous Blood Gluc Sensor (DEXCOM G6 SENSOR) MISC Use to check blood sugar daily  ? Continuous Blood Gluc Transmit (DEXCOM G6 TRANSMITTER) MISC Change every 3 months  ? dorzolamide-timolol (COSOPT) 22.3-6.8 MG/ML ophthalmic solution Place 1 drop into the right eye 2 (two) times daily.  ? fluticasone-salmeterol (ADVAIR DISKUS) 250-50 MCG/ACT AEPB Inhale 1 puff into the lungs in the morning and at bedtime. Rinse after use.  ? FREESTYLE LITE test strip USE TO CHECK BLOOD SUGAR 4 TIMES DAILY.  ? furosemide (LASIX) 80 MG tablet TAKE 1 TABLET BY MOUTH EVERY DAY  ? gabapentin (NEURONTIN) 100 MG capsule TAKE 2 CAPSULES BY MOUTH EVERY MORNING AND TAKE 4 CAPSULES BY MOUTH EVERY EVENING  ? HUMALOG 100 UNIT/ML injection USE MAXIMUM 76 UNITS PER   DAY WITH V-GO PUMP  ? Insulin Disposable Pump (OMNIPOD  5 G6 INTRO, GEN 5,) KIT 1 kit by Does not apply route daily.  ? Insulin Disposable Pump (OMNIPOD 5 G6 POD, GEN 5,) MISC 1 each by Does not apply route every 3 (three) days.  ? KLOR-CON M20 20 MEQ tablet TAKE 3 TABLETS (60 MEQ TOTAL) BY MOUTH DAILY.  ? levothyroxine (SYNTHROID) 137 MCG tablet TAKE 1 TABLET BY MOUTH EVERY DAY BEFORE BREAKFAST  ? losartan (COZAAR) 25 MG tablet Take 1 tablet (25 mg total) by mouth daily.  ? metoprolol succinate (TOPROL-XL) 50 MG 24 hr tablet TAKE 1 TABLET BY MOUTH DAILY. TAKE WITH OR IMMEDIATELY FOLLOWING A MEAL.  ? MYRBETRIQ 25 MG TB24 tablet TAKE 1 TABLET BY MOUTH EVERY DAY  ? nitroGLYCERIN (NITROSTAT) 0.4 MG SL tablet Place 1 tablet (0.4 mg total) under the tongue every 5 (five) minutes as needed for chest pain.  ? omega-3 acid ethyl esters (LOVAZA) 1 g capsule TAKE 2 CAPSULES BY MOUTH TWICE A DAY  ? OVER THE COUNTER MEDICATION Apply 1 application topically daily as needed (pain). Theraworx Pain Cream  ? OZEMPIC, 1 MG/DOSE, 4 MG/3ML SOPN INJECT $RemoveBef'1MG'xhIBnUAKii$  SUBCUTANEOUSLY  ONCE A WEEK.  ? polyethylene glycol (MIRALAX / GLYCOLAX) packet Take 8.5 g by mouth daily.  ? rosuvastatin (CRESTOR) 20 MG tablet TAKE 1 TABLET BY MOUTH EVERYDAY AT BEDTIME  ? traMADol (ULTRAM) 50 MG tablet TAKE 1 TABLET BY MOUTH TWICE A DAY  ? vitamin B-12 (CYANOCOBALAMIN) 1000 MCG  tablet Take 1,000 mcg by mouth daily.  ? ?No facility-administered encounter medications on file as of 05/02/2021.  ? ?CYNTHIA STAINBACK was contacted to remind of upcoming telephone visit with Charlene Brooke on 05/05/2021 at 11:00. Patient was reminded to have any blood glucose and blood pressure readings available for review at appointment.  ? ?Patient confirmed appointment. ? ?Are you having any problems with your medications? No  ? ?Do you have any concerns you like to discuss with the pharmacist? No ? ?Star Rating Drugs: ?Medication:  Last Fill: Day Supply ?Rosuvastatin 20 mg 02/14/2021 90 ?Ozempic 1 mg  03/09/2021 84  ?Losartan 25  mg 03/14/20213 90 ? ?Charlene Brooke, CPP notified ? ?Marijean Niemann, RMA ?Clinical Pharmacy Assistant ?[phone] ? ? ? ? ? ?

## 2021-05-05 ENCOUNTER — Ambulatory Visit (INDEPENDENT_AMBULATORY_CARE_PROVIDER_SITE_OTHER): Payer: Medicare Other | Admitting: Pharmacist

## 2021-05-05 DIAGNOSIS — Z794 Long term (current) use of insulin: Secondary | ICD-10-CM

## 2021-05-05 DIAGNOSIS — I10 Essential (primary) hypertension: Secondary | ICD-10-CM

## 2021-05-05 DIAGNOSIS — I5042 Chronic combined systolic (congestive) and diastolic (congestive) heart failure: Secondary | ICD-10-CM

## 2021-05-05 DIAGNOSIS — E1122 Type 2 diabetes mellitus with diabetic chronic kidney disease: Secondary | ICD-10-CM

## 2021-05-05 DIAGNOSIS — G20A1 Parkinson's disease without dyskinesia, without mention of fluctuations: Secondary | ICD-10-CM

## 2021-05-05 DIAGNOSIS — J45909 Unspecified asthma, uncomplicated: Secondary | ICD-10-CM

## 2021-05-05 DIAGNOSIS — E7800 Pure hypercholesterolemia, unspecified: Secondary | ICD-10-CM

## 2021-05-05 DIAGNOSIS — G20 Parkinson's disease: Secondary | ICD-10-CM

## 2021-05-05 NOTE — Patient Instructions (Signed)
Visit Information ? ?Phone number for Pharmacist: [phone] ? ? Goals Addressed   ?None ?  ? ? ?Care Plan : [place]  ?Updates made by Charlton Haws, [place] since 05/05/2021 12:00 AM  ?  ? ?Problem: Hypertension, Hyperlipidemia, Diabetes, Heart Failure, Coronary Artery Disease, Asthma, Chronic Kidney Disease, and Hypothyroidism   ?Priority: High  ?  ? ?Long-Range Goal: Disease mgmt   ?Start Date: 02/08/2021  ?Expected End Date: 05/06/2022  ?This Visit's Progress: On track  ?Recent Progress: On track  ?Priority: High  ?Note:   ?Current Barriers:  ?None identified ? ?Pharmacist Clinical Goal(s):  ?Patient will contact provider office for questions/concerns as evidenced notation of same in electronic health record through collaboration with PharmD and provider.  ? ?Interventions: ?1:1 collaboration with Tonia Ghent, MD regarding development and update of comprehensive plan of care as evidenced by provider attestation and co-signature ?Inter-disciplinary care team collaboration (see longitudinal plan of care) ?Comprehensive medication review performed; medication list updated in electronic medical record ? ?Hypertension / Heart Failure (BP goal <130/80) ?-Controlled- BP at home is at goal per pt report; he is now tolerating losartan. 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, normal) ?-Last ejection fraction: 40-45% (Date: 06/02/19) - previously 25-30% (05/2023) ?-HF type: Diastolic, improved EF; NYHA Class: not on file; follows w/ Dr Stanford Breed ?-Hx OSA on CPAP ?-Current home readings: 119/76, 133/84, 106/68, 127/80, 120/77 ?-Current treatment: ?Furosemide 80 mg daily - Appropriate, Effective, Safe, Accessible ?Losartan 25 mg daily HS - Appropriate, Effective, Safe, Accessible ?Metoprolol succinate 50 mg daily -Appropriate, Effective, Safe, Accessible ?Klor Con 20 mEq - 3 tab daily -Appropriate, Effective, Safe, Accessible ?-Medications previously  tried: lisinopril, metolazone, spironolactone, Farxiga ?-Denies hypotensive/hypertensive symptoms ?-Educated on BP goals and benefits of medications for prevention of heart attack, stroke and kidney damage; ?-Educated on Proper diuretic administration and potassium supplementation ?-Recommend to continue current medication ? 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 ?Diabetes (A1c goal <7%) ?-Controlled - A1c 6.5% (01/2021) at goal; pt denies low sugars; he finally was able to get Dexcom G6 covered and his been using this ?-Pt reports he typically boluses 45 units w/ breakfast, 55 units w/ lunch and 65 units w/ supper ?-Pt follows with Dr Dwyane Dee for DM/insulin pump  ?-Current medications: ?Humalog via Omnipod pump -Appropriate, Effective, Safe, Accessible ?Ozempic 1 mg weekly -Appropriate, Effective, Safe, Accessible ?Dexcom G6 - Appropriate, Effective, Safe, Accessible ?-Medications previously tried: [place] (UTI), glimepiride, Janumet, metformin ?-Denies hypoglycemic/hyperglycemic symptoms ?-Current meal patterns: reduced to one helping of meals ?-Current exercise: limited d/t Parkinsons ?-Educated on A1c and blood sugar goals; C ?-Assessed pt finances - pt reports Ozempic is $40 per 90 days through mail order and insulin is covered ?-Recommended to continue current medication ? ?Atrial Fibrillation (Goal: prevent  stroke and major bleeding) ?-Controlled ?-CHADSVASC: 5; no anticoagulation per cardiology due to frequent falls w/ Parkinsons ?-Current treatment: ?Metoprolol succinate 50 mg daily -  Appropriate, Effective, Safe, Accessible ?-Recommended to continue current medication ? ?Asthma (Goal: control symptoms and prevent exacerbations) ?-Controlled - pt reports breathing is well controlled; he endorses using Advair twice daily and albuterol infrequently ?-Current treatment  ?Advair ([place]) Diskus 250-50 mcg/act 1 puff BID -Appropriate, Effective, Safe, Accessible ?Albuterol HFA prn - Appropriate, Effective, Safe, Accessible ?-Pulmonary function testing: not on file ?-Exacerbations requiring treatment in last 6 months: 0 ?-Patient reports consistent use of maintenance inhaler ?-Frequency of rescue inhaler use: weekly ?-Counseled on Proper inhaler technique; Differences between maintenance and rescue inhalers ?-Recommended to continue current medication ? ?Parkinson's Disease (Goal: manage symptoms) ?-Controlled ?-s/p PT twice a week; s/p DBS; managed per neurology (Dr Tat) ?-Current treatment  ?Carbidopa-levodopa CR 50-200 mg daily HS - Appropriate, Effective, Safe, Accessible ?Carbidopa-levodopa CR 25-100 mg - 2 tab 5x daily -Appropriate, Effective, Safe, Accessible ?-Discussed benefits of Sinemet; advised pt to continue home PT exercises on his own now that sessions have ended ?-Recommended to continue current medication ? ?Hypothyroidism (Goal: maintain TSH in goal range) ?-Controlled ?-s/p hyperthyroidism radioiodine treatment; managed per endocrine (Dr Dwyane Dee) ?-Current treatment  ?Levothyroxine 137 mg daily, 1.5 tab on Saturdays -Appropriate, Effective, Safe, Accessible ?-Recommended to continue current medication ? ?Patient Goals/Self-Care Activities ?Patient will:  ?- take medications as prescribed as evidenced by patient report and record review ?focus on medication adherence by pill box ?check glucose using  Dexcom ?check blood pressure daily, document, and provide at future appointments ? ?  ?  ? ?Patient verbalizes understanding of instructions and care plan provided today and agrees to view in [place]. Active MyChart status confirmed with patient.   ?Telephone follow up appointment with pharmacy team member scheduled for: 1 year ? ?Charlene Brooke, PharmD, BCACP ?Clinical Pharmacist ?[facility] ?[phone] ?  ?

## 2021-05-05 NOTE — Progress Notes (Signed)
? ?Chronic Care Management ?Pharmacy Note ? ?05/05/2021 ?Name:  Henry [name] MRN:  [id number] DOB:  06-17-1950 ? ?Summary: CCM F/U visit ?-Pt endorses compliance with medications, his wife organizes them for him in pill boxes ?-Pt is tolerating losartan now without issue; BP is at goal ?-Pt is wearing Dexcom now to monitor sugars after [employer] process; A1c is at goal (6.5%) most recently, following with Dr Dwyane Dee ?-Kidney function recently improved (GFR > 60) 03/2021, no longer CKD Stage 3 ? ?Recommendations/Changes made from today's visit: ?-No med changes ? ?Plan: ?-[place] will call patient 6 months for general update ?-Pharmacist follow up televisit scheduled for 1 year ?-PCP annual visit due July 2023, not yet scheduled ? ? ? ?Subjective: ?Henry [name] is an 71 y.o. year old male who is a primary patient of Damita Dunnings, Elveria Rising, MD.  The CCM team was consulted for assistance with disease management and care coordination needs.   ? ?Engaged with patient by telephone for follow up visit in response to provider referral for pharmacy case management and/or care coordination services.  ? ?Consent to Services:  ?The patient was given information about Chronic Care Management services, agreed to services, and gave verbal consent prior to initiation of services.  Please see initial visit note for detailed documentation.  ? ?Patient Care Team: ?Tonia Ghent, MD as PCP - General (Family Medicine) ?Lelon Perla, MD as PCP - Cardiology (Cardiology) ?[place], [place] ?Ludwig Clarks, DO as Consulting Physician (Neurology) ?Kiran Carline, [place], [facility] as Pharmacist (Pharmacist) ? ?Recent office visits: ?04/07/21 Dr Damita Dunnings OV: f/u umbilical hernia. Not a good candidate for surgery unless sx worsen. Use donut pillow, possibly wide belt for abdominal wall. No med changes. ? ?12/23/2020 - [name], LPN - Patient presented for Annual Wellness Visit.  ?07/27/20 Dr Damita Dunnings OV: hospital f/u  (fall/facial bleeding); referred to PT ? ?Recent consult visits: ?02/08/20 Dr Dwyane Dee (endocrine): f/u DM. On omnipod insulin pump w/ Humalog. Advised Freestyle [place]. F/U 4 months. ?12/24/2020 - Kirk Ruths, MD - Cardiology - Patient presented for follow up for coronary artery disease. Labs: BMP. Start: losartan 25 mg daily ?11/30/2020 Alonza Bogus, DO - Neurology - Patient presented for parkinson's disease. Referral to: Home Health. Start: carbidopa-levodopa ER (SINEMET CR) 50-200 MG tablet - Take at bedtime. This is to be taken in conjunction with carbidopa/levodopa 25/100 CR, currently taking 2 tablets up to 5 times per day (generally takes 9 or 10 tablets/day). ?11/11/2020 - Ila Mcgill, DPM - Podiatry - Patient presented for thickened toenails. Procedure: debrided nailbeds 1-5 both feet no iatrogenic bleeding . ?10/07/2020 - Elayne Snare, MD - Endocrinology - Patient presented for diabetes. Labs: BMP, A1c and Microalbumin. No medication changes.  ?09/29/2020 - Cristopher Peru, MD - Cardiology - Patient presented for follow up for pacemaker function. Normal device function. No medication changes.  ? ?Hospital visits: ?None in previous 6 months ? ? ?Objective: ? ?Lab Results  ?Component Value Date  ? CREATININE 1.21 03/14/2021  ? BUN 17 03/14/2021  ? GFR 52.17 (L) 02/04/2021  ? EGFR 65 03/14/2021  ? GFRNONAA 53 (L) 09/25/2019  ? GFRAA >60 09/25/2019  ? NA 140 03/14/2021  ? K 4.2 03/14/2021  ? CALCIUM 9.2 03/14/2021  ? CO2 27 03/14/2021  ? GLUCOSE 172 (H) 03/14/2021  ? ? ?Lab Results  ?Component Value Date/Time  ? HGBA1C 6.5 02/04/2021 01:33 PM  ? HGBA1C 6.3 10/04/2020 09:30 AM  ? FRUCTOSAMINE 359 (H) 07/01/2019 01:37 PM  ?  FRUCTOSAMINE 277 11/09/2017 08:07 AM  ? GFR 52.17 (L) 02/04/2021 01:33 PM  ? GFR 49.69 (L) 10/04/2020 09:30 AM  ? MICROALBUR <0.7 02/04/2021 01:33 PM  ? MICROALBUR <0.7 12/19/2019 10:36 AM  ?  ?Last diabetic Eye exam:  ?Lab Results  ?Component Value Date/Time  ? HMDIABEYEEXA No Retinopathy  03/03/2021 12:00 AM  ?  ?Last diabetic Foot exam: No results found for: HMDIABFOOTEX  ? ?Lab Results  ?Component Value Date  ? CHOL 110 06/24/2020  ? HDL 32.30 (L) 06/24/2020  ? LDLCALC 47 06/24/2020  ? LDLDIRECT 51.0 04/24/2019  ? TRIG 150.0 (H) 06/24/2020  ? CHOLHDL 3 06/24/2020  ? ? ? ?  Latest Ref Rng & Units 06/24/2020  ?  8:55 AM 05/24/2020  ?  1:22 PM 08/16/2018  ?  9:16 AM  ?Hepatic Function  ?Total Protein 6.0 - 8.3 g/dL 6.3   7.1   6.7    ?Albumin 3.5 - 5.2 g/dL 4.1   4.3   4.4    ?AST 0 - 37 U/L $Remo'5   6   8    'nQtwp$ ?ALT 0 - 53 U/L $Remo'3   4   8    'asGKx$ ?Alk Phosphatase 39 - 117 U/L 87   72   104    ?Total Bilirubin 0.2 - 1.2 mg/dL 0.6   1.1   0.5    ?Bilirubin, Direct 0.0 - 0.3 mg/dL  0.3     ? ? ?Lab Results  ?Component Value Date/Time  ? TSH 3.09 06/24/2020 08:55 AM  ? TSH 2.13 03/24/2020 08:29 AM  ? FREET4 0.77 04/24/2019 09:25 AM  ? FREET4 0.88 08/16/2018 09:16 AM  ? ? ? ?  Latest Ref Rng & Units 05/24/2020  ?  1:22 PM 09/25/2019  ?  2:32 PM 08/19/2019  ?  4:33 PM  ?CBC  ?WBC 4.0 - 10.5 K/uL 12.2   6.2   6.4    ?Hemoglobin 13.0 - 17.0 g/dL 14.8   13.9   13.2    ?Hematocrit 39.0 - 52.0 % 44.0   43.7   42.2    ?Platelets 150.0 - 400.0 K/uL 121.0   195   195    ? ? ?Lab Results  ?Component Value Date/Time  ? VD25OH 27.9 06/25/2017 12:00 AM  ? ? ?Clinical ASCVD: Yes  ?The ASCVD Risk score (Arnett DK, et al., 2019) failed to calculate for the following reasons: ?  The patient has a prior MI or stroke diagnosis   ? ? ?  12/23/2020  ? 11:25 AM 08/16/2018  ? 12:16 PM 04/13/2017  ?  1:52 PM  ?Depression screen PHQ 2/9  ?Decreased Interest 0 0 3  ?Down, Depressed, Hopeless 0 0 3  ?PHQ - 2 Score 0 0 6  ?Altered sleeping  3   ?Tired, decreased energy  0   ?Change in appetite  0   ?Feeling bad or failure about yourself   0   ?Trouble concentrating  0   ?Moving slowly or fidgety/restless  0   ?Suicidal thoughts  0 0  ?PHQ-9 Score  3   ?Difficult doing work/chores  Not difficult at all Somewhat difficult  ?  ? ?Social History  ? ?Tobacco Use   ?Smoking Status Never  ?Smokeless Tobacco Never  ? ?BP Readings from Last 3 Encounters:  ?04/07/21 124/78  ?02/07/21 118/82  ?12/24/20 130/61  ? ?Pulse Readings from Last 3 Encounters:  ?04/07/21 81  ?02/07/21 73  ?12/24/20 81  ? ?Wt Readings from Last  3 Encounters:  ?04/07/21 265 lb (120.2 kg)  ?02/07/21 265 lb 6.4 oz (120.4 kg)  ?12/24/20 265 lb (120.2 kg)  ? ?BMI Readings from Last 3 Encounters:  ?04/07/21 40.29 kg/m?  ?02/07/21 40.35 kg/m?  ?12/24/20 39.13 kg/m?  ? ? ?Assessment/Interventions: Review of patient past medical history, allergies, medications, health status, including review of consultants reports, laboratory and other test data, was performed as part of comprehensive evaluation and provision of chronic care management services.  ? ?SDOH:  (Social Determinants of Health) assessments and interventions performed: No - completed at [facility] 12/2020 ? ?SDOH Screenings  ? ?Alcohol Screen: Low Risk   ? Last Alcohol Screening Score (AUDIT): 0  ?Depression (PHQ2-9): Low Risk   ? PHQ-2 Score: 0  ?Financial Resource Strain: Low Risk   ? Difficulty of Paying Living Expenses: Not hard at all  ?Food Insecurity: No Food Insecurity  ? Worried About Charity fundraiser in the Last Year: Never true  ? Ran Out of Food in the Last Year: Never true  ?Housing: Low Risk   ? Last Housing Risk Score: 0  ?Physical Activity: Insufficiently Active  ? Days of Exercise per Week: 3 days  ? Minutes of Exercise per Session: 10 min  ?Social Connections: Socially Integrated  ? Frequency of Communication with Friends and Family: Twice a week  ? Frequency of Social Gatherings with Friends and Family: More than three times a week  ? Attends Religious Services: More than 4 times per year  ? Active Member of Clubs or Organizations: Yes  ? Attends Archivist Meetings: More than 4 times per year  ? Marital Status: Married  ?Stress: No Stress Concern Present  ? Feeling of Stress : Not at all  ?Tobacco Use: Low Risk   ? Smoking Tobacco  Use: Never  ? Smokeless Tobacco Use: Never  ? Passive Exposure: Not on file  ?Transportation Needs: No Transportation Needs  ? Lack of Transportation (Medical): No  ? Lack of Transportation (Non-Medical): No  ?

## 2021-05-06 NOTE — Progress Notes (Signed)
Remote ICD transmission.   

## 2021-05-08 DIAGNOSIS — E1122 Type 2 diabetes mellitus with diabetic chronic kidney disease: Secondary | ICD-10-CM | POA: Diagnosis not present

## 2021-05-08 DIAGNOSIS — E7800 Pure hypercholesterolemia, unspecified: Secondary | ICD-10-CM

## 2021-05-08 DIAGNOSIS — I5042 Chronic combined systolic (congestive) and diastolic (congestive) heart failure: Secondary | ICD-10-CM

## 2021-05-08 DIAGNOSIS — J45909 Unspecified asthma, uncomplicated: Secondary | ICD-10-CM

## 2021-05-08 DIAGNOSIS — I10 Essential (primary) hypertension: Secondary | ICD-10-CM | POA: Diagnosis not present

## 2021-05-08 DIAGNOSIS — Z794 Long term (current) use of insulin: Secondary | ICD-10-CM | POA: Diagnosis not present

## 2021-05-08 DIAGNOSIS — N1831 Chronic kidney disease, stage 3a: Secondary | ICD-10-CM | POA: Diagnosis not present

## 2021-05-10 DIAGNOSIS — G20 Parkinson's disease: Secondary | ICD-10-CM | POA: Diagnosis not present

## 2021-05-10 DIAGNOSIS — I69891 Dysphagia following other cerebrovascular disease: Secondary | ICD-10-CM | POA: Diagnosis not present

## 2021-05-10 DIAGNOSIS — I5042 Chronic combined systolic (congestive) and diastolic (congestive) heart failure: Secondary | ICD-10-CM | POA: Diagnosis not present

## 2021-05-10 DIAGNOSIS — G3184 Mild cognitive impairment, so stated: Secondary | ICD-10-CM | POA: Diagnosis not present

## 2021-05-10 DIAGNOSIS — I69320 Aphasia following cerebral infarction: Secondary | ICD-10-CM | POA: Diagnosis not present

## 2021-05-10 DIAGNOSIS — I130 Hypertensive heart and chronic kidney disease with heart failure and stage 1 through stage 4 chronic kidney disease, or unspecified chronic kidney disease: Secondary | ICD-10-CM | POA: Diagnosis not present

## 2021-05-15 ENCOUNTER — Other Ambulatory Visit: Payer: Self-pay | Admitting: Endocrinology

## 2021-05-17 DIAGNOSIS — G20 Parkinson's disease: Secondary | ICD-10-CM | POA: Diagnosis not present

## 2021-05-17 DIAGNOSIS — I5042 Chronic combined systolic (congestive) and diastolic (congestive) heart failure: Secondary | ICD-10-CM | POA: Diagnosis not present

## 2021-05-17 DIAGNOSIS — G3184 Mild cognitive impairment, so stated: Secondary | ICD-10-CM | POA: Diagnosis not present

## 2021-05-17 DIAGNOSIS — I69891 Dysphagia following other cerebrovascular disease: Secondary | ICD-10-CM | POA: Diagnosis not present

## 2021-05-17 DIAGNOSIS — I69320 Aphasia following cerebral infarction: Secondary | ICD-10-CM | POA: Diagnosis not present

## 2021-05-17 DIAGNOSIS — I130 Hypertensive heart and chronic kidney disease with heart failure and stage 1 through stage 4 chronic kidney disease, or unspecified chronic kidney disease: Secondary | ICD-10-CM | POA: Diagnosis not present

## 2021-05-19 DIAGNOSIS — I69320 Aphasia following cerebral infarction: Secondary | ICD-10-CM | POA: Diagnosis not present

## 2021-05-19 DIAGNOSIS — I69891 Dysphagia following other cerebrovascular disease: Secondary | ICD-10-CM | POA: Diagnosis not present

## 2021-05-19 DIAGNOSIS — G3184 Mild cognitive impairment, so stated: Secondary | ICD-10-CM | POA: Diagnosis not present

## 2021-05-19 DIAGNOSIS — I130 Hypertensive heart and chronic kidney disease with heart failure and stage 1 through stage 4 chronic kidney disease, or unspecified chronic kidney disease: Secondary | ICD-10-CM | POA: Diagnosis not present

## 2021-05-19 DIAGNOSIS — I5042 Chronic combined systolic (congestive) and diastolic (congestive) heart failure: Secondary | ICD-10-CM | POA: Diagnosis not present

## 2021-05-19 DIAGNOSIS — G20 Parkinson's disease: Secondary | ICD-10-CM | POA: Diagnosis not present

## 2021-05-27 DIAGNOSIS — G20 Parkinson's disease: Secondary | ICD-10-CM | POA: Diagnosis not present

## 2021-05-27 DIAGNOSIS — I69320 Aphasia following cerebral infarction: Secondary | ICD-10-CM | POA: Diagnosis not present

## 2021-05-27 DIAGNOSIS — I5042 Chronic combined systolic (congestive) and diastolic (congestive) heart failure: Secondary | ICD-10-CM | POA: Diagnosis not present

## 2021-05-27 DIAGNOSIS — I69891 Dysphagia following other cerebrovascular disease: Secondary | ICD-10-CM | POA: Diagnosis not present

## 2021-05-27 DIAGNOSIS — I130 Hypertensive heart and chronic kidney disease with heart failure and stage 1 through stage 4 chronic kidney disease, or unspecified chronic kidney disease: Secondary | ICD-10-CM | POA: Diagnosis not present

## 2021-05-27 DIAGNOSIS — G3184 Mild cognitive impairment, so stated: Secondary | ICD-10-CM | POA: Diagnosis not present

## 2021-05-30 NOTE — Progress Notes (Unsigned)
Assessment/Plan:   1.  Parkinsons Disease             -Status post bilateral DBS STN surgery             -Battery changes occurring September 05, 2012; June 10, 2015; November 13, 2017; 09/25/19.  Has about 1 year left on current battery             -Patient needs to be directly monitored if he is going to ambulate.               -Take carbidopa/levodopa 25/100 CR, currently taking 2 tablets up to 5 times per day (generally takes 9 or 10 tablets/day)             -Take carbidopa/levodopa 50/200 CR at bedtime   -Did recently complete speech therapy and his speech is markedly better.  He was even able to sing at church this week, and he states that he has not been able to do that in a while.  Encouraged him to do speech therapy at least every 6 months.  -Patient does have just a small amount of right-sided tremor.  However, when I tried to increase his stimulation on that side (which is quite low) he had right-sided face pull.  In the past, I have tried to set up the DBS differently and he just did not like it.  We decided to leave it where it is right now, and think about setting it up differently when he is due for battery change in about a year.   2.  RBD             -He has tried clonazepam several times and ultimately ends up stopping it because of side effect.  Could try Rozerem in the future if insurance would pay.  Decided to hold off for now as the patient states that while dreaming is vivid, it is not particularly bothersome and he is not falling out of the bed.  Discussed bedroom safety. 3.  OSAS, noncompliant with CPAP.             -Patient aware of risks/morbidity and mortality associated with untreated sleep apnea 4.  CHF             -ICD placed on 11/15/2013  -DBS will need placed into special mode if pt ever requires cardioversion 5.  Facial paresthesias             -Has been going on for few years and became bilateral, not c/w TN.  He has had several negative noncontrast CTs of the  brain.  Kidney function is not good enough for contrasted CT.  His DBS device is not MRI compatible.             -I am unsure what this is, and offered to try carbamazepine, but he really did not want that.             -I have turned off his DBS and he noticed no change in the facial paresthesias.  6.  Dysphagia  -Patient had updated modified barium swallow on March 30, 2021.  Patient with mild oral phase dysphagia and mild to moderate pharyngeal phase dysphagia and also the appearance of esophageal phase dysphagia.  The speech therapist recommended for his regular diet, but if he has more coughing or other dysphagia symptoms, to consider nectar thick liquids.  -It should be noted that Parkinson's disease does not cause esophageal phase dysphagia and  this may need to be evaluated by gastroenterology.  It could certainly be responsible for the other phases of dysphagia.  7.  Myoclonus  -likely from gabapentin.  Will decrease that some.  Currently on $RemoveBefo'100mg'meyBuRxhiCa$ , 2 in the AM, 4 at night.  Will decrease this to 1 in the AM, 2 at night  -add cymbalta 30 mg daily to see if helps PN enough   Subjective:   Henry [name] was seen today in follow up for Parkinsons disease.  My previous records were reviewed prior to todays visit as well as outside records available to me.  Patient with wife who supplements history.  Patient had updated modified barium swallow on March 30, 2021.  Patient with mild oral phase dysphagia and mild to moderate pharyngeal phase dysphagia and also the appearance of esophageal phase dysphagia.  The speech therapist recommended for his regular diet, but if he has more coughing or other dysphagia symptoms, to consider nectar thick liquids.  Rarely falling - last 2 months ago.  He is noting R sided tremor.  He states that it "jerks more than it shakes."  Current prescribed movement disorder medications: Carbidopa/levodopa 25/100 CR, 2 tablets at 7 AM/11 AM/3 PM/1 tablet at 7  PM Carbidopa/levodopa 50/200 CR at bedtime    ALLERGIES:   Allergies  Allergen Reactions   Penicillins Anaphylaxis and Rash    Because of a history of documented adverse serious drug reaction;Medi Alert bracelet  is recommended PATIENT HAS HAD A PCN REACTION WITH IMMEDIATE RASH, FACIAL/TONGUE/THROAT SWELLING, SOB, OR LIGHTHEADEDNESS WITH HYPOTENSION:  #  #  YES  #  #  Has patient had a PCN reaction causing severe rash involving mucus membranes or skin necrosis: unknown Has patient had a PCN reaction that required hospitalization NO Has patient had a PCN reaction occurring within the last 10 years: NO   Klonopin [Clonazepam] Other (See Comments)    agitation   Peanut-Containing Drug Products Cough   Watermelon [Citrullus Vulgaris] Other (See Comments) and Cough    Tickle in throat    CURRENT MEDICATIONS:  Outpatient Encounter Medications as of 05/31/2021  Medication Sig   acetaminophen (TYLENOL) 500 MG tablet Take 1,000 mg by mouth at bedtime as needed for mild pain or headache.    albuterol (VENTOLIN HFA) 108 (90 Base) MCG/ACT inhaler Inhale 2 puffs into the lungs every 6 (six) hours as needed for wheezing or shortness of breath.   allopurinol (ZYLOPRIM) 100 MG tablet TAKE 1 & 1/2 TABLET BY MOUTH DAILY   aspirin EC 81 MG tablet Take 1 tablet (81 mg total) by mouth daily. Swallow whole.   carbidopa-levodopa (SINEMET CR) 50-200 MG tablet TAKE 1 TABLET BY MOUTH EVERYDAY AT BEDTIME   Carbidopa-Levodopa ER (SINEMET CR) 25-100 MG tablet controlled release 2 po 5 times per day as directed   Cholecalciferol (VITAMIN D) 50 MCG (2000 UT) tablet Take 2,000 Units by mouth daily.    colchicine 0.6 MG tablet Take 1 tablet (0.6 mg total) by mouth daily as needed (gout).   Continuous Blood Gluc Receiver (DEXCOM G6 RECEIVER) DEVI Use to check blood sugar daily   Continuous Blood Gluc Sensor (DEXCOM G6 SENSOR) MISC Use to check blood sugar daily   Continuous Blood Gluc Transmit (DEXCOM G6 TRANSMITTER)  MISC Change every 3 months   dorzolamide-timolol (COSOPT) 22.3-6.8 MG/ML ophthalmic solution Place 1 drop into the right eye 2 (two) times daily.   DULoxetine (CYMBALTA) 30 MG capsule Take 1 capsule (30 mg total) by mouth  daily.   fluticasone-salmeterol (ADVAIR DISKUS) 250-50 MCG/ACT AEPB Inhale 1 puff into the lungs in the morning and at bedtime. Rinse after use.   FREESTYLE LITE test strip USE TO CHECK BLOOD SUGAR 4 TIMES DAILY.   furosemide (LASIX) 80 MG tablet TAKE 1 TABLET BY MOUTH EVERY DAY   HUMALOG 100 UNIT/ML injection USE MAXIMUM 76 UNITS PER   DAY WITH V-GO PUMP   Insulin Disposable Pump (OMNIPOD 5 G6 INTRO, GEN 5,) KIT 1 kit by Does not apply route daily.   Insulin Disposable Pump (OMNIPOD 5 G6 POD, GEN 5,) MISC 1 each by Does not apply route every 3 (three) days.   KLOR-CON M20 20 MEQ tablet TAKE 3 TABLETS (60 MEQ TOTAL) BY MOUTH DAILY.   levothyroxine (SYNTHROID) 137 MCG tablet TAKE 1 TABLET BY MOUTH EVERY DAY BEFORE BREAKFAST   losartan (COZAAR) 25 MG tablet Take 1 tablet (25 mg total) by mouth daily.   metoprolol succinate (TOPROL-XL) 50 MG 24 hr tablet TAKE 1 TABLET BY MOUTH DAILY. TAKE WITH OR IMMEDIATELY FOLLOWING A MEAL.   MYRBETRIQ 25 MG TB24 tablet TAKE 1 TABLET BY MOUTH EVERY DAY   nitroGLYCERIN (NITROSTAT) 0.4 MG SL tablet Place 1 tablet (0.4 mg total) under the tongue every 5 (five) minutes as needed for chest pain.   omega-3 acid ethyl esters (LOVAZA) 1 g capsule TAKE 2 CAPSULES BY MOUTH TWICE A DAY   OVER THE COUNTER MEDICATION Apply 1 application topically daily as needed (pain). Theraworx Pain Cream   OZEMPIC, 1 MG/DOSE, 4 MG/3ML SOPN INJECT $RemoveBef'1MG'XHRMPHQrjw$  SUBCUTANEOUSLY  ONCE A WEEK.   polyethylene glycol (MIRALAX / GLYCOLAX) packet Take 8.5 g by mouth daily.   rosuvastatin (CRESTOR) 20 MG tablet TAKE 1 TABLET BY MOUTH EVERYDAY AT BEDTIME   traMADol (ULTRAM) 50 MG tablet TAKE 1 TABLET BY MOUTH TWICE A DAY   vitamin B-12 (CYANOCOBALAMIN) 1000 MCG tablet Take 1,000 mcg by mouth  daily.   [DISCONTINUED] gabapentin (NEURONTIN) 100 MG capsule TAKE 2 CAPSULES BY MOUTH EVERY MORNING AND TAKE 4 CAPSULES BY MOUTH EVERY EVENING   gabapentin (NEURONTIN) 100 MG capsule 1 in the AM, 2 in the PM   No facility-administered encounter medications on file as of 05/31/2021.    Objective:   PHYSICAL EXAMINATION:    VITALS:   Vitals:   05/31/21 0935  BP: 112/72  Pulse: 75  SpO2: 90%  Weight: 265 lb (120.2 kg)  Height: $Remove'5\' 8"'kMrUGNl$  (1.727 m)      GEN:  The patient appears stated age and is in NAD. HEENT:  Normocephalic, atraumatic.  The mucous membranes are moist. The superficial temporal arteries are without ropiness or tenderness. Cardiovascular: Regular rate rhythm Lungs: Clear to auscultation bilaterally  Neurological examination:  Orientation: The patient is alert and oriented to person, place, and time.  He asks appropriate questions and is very interactive today. Cranial nerves: There is good facial symmetry with facial hypomimia. The speech is fluent and dysarthric (baseline) he is hypophonic.Marland Kitchen Soft palate rises symmetrically and there is no tongue deviation. Hearing is intact to conversational tone. Sensation: Sensation is intact to light touch throughout Motor: Strength is at least antigravity x4.  Movement examination: Tone: There is normal tone in the upper and lower extremities Abnormal movements: Very rare rest tremor on the right today, which is same as last visit. Coordination:  There is decremation with RAM's, mostly with foot taps on the left.   Gait and Station: Patient had his U step in the office today.  He was flexed at the waist, but actually ambulated quite well today and did fairly well in the turns.  I have reviewed and interpreted the following labs independently    Chemistry      Component Value Date/Time   NA 140 03/14/2021 1219   K 4.2 03/14/2021 1219   CL 99 03/14/2021 1219   CO2 27 03/14/2021 1219   BUN 17 03/14/2021 1219   CREATININE  1.21 03/14/2021 1219   CREATININE 2.13 06/25/2017 0000   CREATININE 1.53 (H) 05/22/2016 1106      Component Value Date/Time   CALCIUM 9.2 03/14/2021 1219   ALKPHOS 87 06/24/2020 0855   AST 5 06/24/2020 0855   ALT 3 06/24/2020 0855   BILITOT 0.6 06/24/2020 0855       Lab Results  Component Value Date   WBC 12.2 (H) 05/24/2020   HGB 14.8 05/24/2020   HCT 44.0 05/24/2020   MCV 84.2 05/24/2020   PLT 121.0 (L) 05/24/2020    Lab Results  Component Value Date   TSH 3.09 06/24/2020     Total time spent on today's visit was 30 minutes, including both face-to-face time and nonface-to-face time.  Time included that spent on review of records (prior notes available to me/labs/imaging if pertinent), discussing treatment and goals, answering patient's questions and coordinating care.  This was independent of his DBS time, on a separate note  Cc:  Tonia Ghent, MD

## 2021-05-31 ENCOUNTER — Encounter: Payer: Self-pay | Admitting: Neurology

## 2021-05-31 ENCOUNTER — Other Ambulatory Visit (INDEPENDENT_AMBULATORY_CARE_PROVIDER_SITE_OTHER): Payer: Medicare Other

## 2021-05-31 ENCOUNTER — Ambulatory Visit (INDEPENDENT_AMBULATORY_CARE_PROVIDER_SITE_OTHER): Payer: Medicare Other | Admitting: Neurology

## 2021-05-31 VITALS — BP 112/72 | HR 75 | Ht 68.0 in | Wt 265.0 lb

## 2021-05-31 DIAGNOSIS — E890 Postprocedural hypothyroidism: Secondary | ICD-10-CM

## 2021-05-31 DIAGNOSIS — E1165 Type 2 diabetes mellitus with hyperglycemia: Secondary | ICD-10-CM | POA: Diagnosis not present

## 2021-05-31 DIAGNOSIS — R1319 Other dysphagia: Secondary | ICD-10-CM

## 2021-05-31 DIAGNOSIS — G253 Myoclonus: Secondary | ICD-10-CM | POA: Diagnosis not present

## 2021-05-31 DIAGNOSIS — I255 Ischemic cardiomyopathy: Secondary | ICD-10-CM

## 2021-05-31 DIAGNOSIS — E782 Mixed hyperlipidemia: Secondary | ICD-10-CM | POA: Diagnosis not present

## 2021-05-31 DIAGNOSIS — Z794 Long term (current) use of insulin: Secondary | ICD-10-CM

## 2021-05-31 DIAGNOSIS — G20 Parkinson's disease: Secondary | ICD-10-CM

## 2021-05-31 DIAGNOSIS — E1142 Type 2 diabetes mellitus with diabetic polyneuropathy: Secondary | ICD-10-CM

## 2021-05-31 DIAGNOSIS — G20A1 Parkinson's disease without dyskinesia, without mention of fluctuations: Secondary | ICD-10-CM

## 2021-05-31 LAB — TSH: TSH: 2.21 u[IU]/mL (ref 0.35–5.50)

## 2021-05-31 LAB — COMPREHENSIVE METABOLIC PANEL
ALT: 4 U/L (ref 0–53)
AST: 7 U/L (ref 0–37)
Albumin: 4.2 g/dL (ref 3.5–5.2)
Alkaline Phosphatase: 72 U/L (ref 39–117)
BUN: 22 mg/dL (ref 6–23)
CO2: 32 mEq/L (ref 19–32)
Calcium: 9 mg/dL (ref 8.4–10.5)
Chloride: 99 mEq/L (ref 96–112)
Creatinine, Ser: 1.48 mg/dL (ref 0.40–1.50)
GFR: 47.86 mL/min — ABNORMAL LOW (ref >60.00)
Glucose, Bld: 149 mg/dL — ABNORMAL HIGH (ref 70–99)
Potassium: 4.2 mEq/L (ref 3.5–5.1)
Sodium: 138 mEq/L (ref 135–145)
Total Bilirubin: 0.5 mg/dL (ref 0.2–1.2)
Total Protein: 6.5 g/dL (ref 6.0–8.3)

## 2021-05-31 LAB — LIPID PANEL
Cholesterol: 103 mg/dL (ref 0–200)
HDL: 29.1 mg/dL — ABNORMAL LOW (ref >39.00)
LDL Cholesterol: 39 mg/dL (ref 0–99)
NonHDL: 73.7
Total CHOL/HDL Ratio: 4
Triglycerides: 172 mg/dL — ABNORMAL HIGH (ref 0.0–149.0)
VLDL: 34.4 mg/dL (ref 0.0–40.0)

## 2021-05-31 LAB — HEMOGLOBIN A1C: Hgb A1c MFr Bld: 6.6 % — ABNORMAL HIGH (ref 4.6–6.5)

## 2021-05-31 MED ORDER — DULOXETINE HCL 30 MG PO CPEP: 30.0000 mg | ORAL_CAPSULE | Freq: Every day | ORAL | 1 refills | Status: DC

## 2021-05-31 MED ORDER — GABAPENTIN 100 MG PO CAPS: ORAL_CAPSULE | ORAL | 1 refills | Status: DC

## 2021-05-31 NOTE — Patient Instructions (Signed)
Decrease gabapentin to 100 mg, 1 in the AM, 2 at bed START cymbalta (duloxetine), 30 mg daily

## 2021-05-31 NOTE — Procedures (Signed)
DBS Programming was performed.    Manufacturer of DBS device: [employer]onic  Total time spent programming was 9 minutes.  Device was confirmed to be on.  Soft start was confirmed to be on.  Impedences were checked and were within normal limits.  Battery was checked and was 56%.    Final settings were as follows with Group B active:   Active Contact Amplitude  PW (ms) Frequency (hz) Side Effects Battery  Left Brain        09/08/19 1-2+ 4.4V 90 170  2.59  10/08/19             Group A 1-2+ 4.25m 90 170         Group B 0-1-2-C+ 1.1 80 125     0- (1.1)1-(1.0)2-(0.6)       01/19/20             Group B 0-1-2-C+ 1.1 80 125  4 yrs 9 months  05/27/20 Group B 0-1-2-C+ 1.3 80 125  3 years, 1 month  11/30/20 0-1-2-C+ 1.3 80 125  2 years, 5 months  05/31/21 0-1-2-C+ 1.3 80 125 Higher amp with face pull 1 yr, 10 months                   Right Brain        09/08/19 4-7+ 4.5V 90 170    10/08/19             Group A 4-7+ 2.573m90 170         Group B 4-7-C+ 2.5 60 125     4-(2.5)7-(0.6)C+       01/19/20             Group B 4-7-C+ 2.8 60 165    05/27/20 group B 4-7-C+ 2.8 60 165    11/30/20 4-7-C+ 2.8 60 165    05/31/21 4-7-C+ 2.9 60 165

## 2021-06-01 ENCOUNTER — Other Ambulatory Visit: Payer: Self-pay | Admitting: Neurology

## 2021-06-03 ENCOUNTER — Other Ambulatory Visit: Payer: Medicare Other

## 2021-06-07 ENCOUNTER — Other Ambulatory Visit: Payer: Self-pay

## 2021-06-07 ENCOUNTER — Emergency Department (HOSPITAL_BASED_OUTPATIENT_CLINIC_OR_DEPARTMENT_OTHER)
Admission: EM | Admit: 2021-06-07 | Discharge: 2021-06-07 | Disposition: A | Discharge status: Home / Self Care | Payer: Medicare Other | Attending: Emergency Medicine | Admitting: Emergency Medicine

## 2021-06-07 ENCOUNTER — Encounter (HOSPITAL_BASED_OUTPATIENT_CLINIC_OR_DEPARTMENT_OTHER): Payer: Self-pay | Admitting: Obstetrics and Gynecology

## 2021-06-07 DIAGNOSIS — Z7982 Long term (current) use of aspirin: Secondary | ICD-10-CM | POA: Insufficient documentation

## 2021-06-07 DIAGNOSIS — W19XXXA Unspecified fall, initial encounter: Secondary | ICD-10-CM

## 2021-06-07 DIAGNOSIS — Z79899 Other long term (current) drug therapy: Secondary | ICD-10-CM | POA: Insufficient documentation

## 2021-06-07 DIAGNOSIS — W01198A Fall on same level from slipping, tripping and stumbling with subsequent striking against other object, initial encounter: Secondary | ICD-10-CM | POA: Diagnosis not present

## 2021-06-07 DIAGNOSIS — Z794 Long term (current) use of insulin: Secondary | ICD-10-CM | POA: Insufficient documentation

## 2021-06-07 DIAGNOSIS — S01311A Laceration without foreign body of right ear, initial encounter: Secondary | ICD-10-CM | POA: Diagnosis not present

## 2021-06-07 DIAGNOSIS — Z91010 Allergy to peanuts: Secondary | ICD-10-CM | POA: Diagnosis not present

## 2021-06-07 DIAGNOSIS — R58 Hemorrhage, not elsewhere classified: Secondary | ICD-10-CM | POA: Diagnosis not present

## 2021-06-07 DIAGNOSIS — S0991XA Unspecified injury of ear, initial encounter: Secondary | ICD-10-CM | POA: Diagnosis present

## 2021-06-07 MED ORDER — LIDOCAINE HCL 2 % IJ SOLN
20.0000 mL | Freq: Once | INTRAMUSCULAR | Status: DC
  Filled 2021-06-07: qty 20

## 2021-06-07 MED ORDER — LIDOCAINE-EPINEPHRINE-TETRACAINE (LET) TOPICAL GEL
3.0000 mL | Freq: Once | TOPICAL | Status: AC
Start: 2021-06-07 — End: 2021-06-07
  Administered 2021-06-07: 3 mL via TOPICAL
  Filled 2021-06-07: qty 3

## 2021-06-07 NOTE — ED Provider Notes (Signed)
[facility] Provider Note   CSN: [id number] Arrival date & time: 06/07/21  1541     History  Chief Complaint  Patient presents with   Fall   Ear Laceration    Henry [name] is a 71 y.o. male.   Fall  Patient presents with fall.  Hit ear on the fridge.  Mechanical fall.  No loss consciousness.  Only on aspirin.  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  However has had bleeding from the ear.  Reportedly was squirting some blood.  Not feeling lightheaded or dizziness.    Home Medications Prior to Admission medications   Medication Sig Start Date End Date Taking? Authorizing Provider  acetaminophen (TYLENOL) 500 MG tablet Take 1,000 mg by mouth at bedtime as needed for mild pain or headache.     [provider]  albuterol (VENTOLIN HFA) 108 (90 Base) MCG/ACT inhaler Inhale 2 puffs into the lungs every 6 (six) hours as needed for wheezing or shortness of breath. 05/13/20   Tonia Ghent, MD  allopurinol (ZYLOPRIM) 100 MG tablet TAKE 1 & 1/2 TABLET BY MOUTH DAILY 06/11/20   Tonia Ghent, MD  aspirin EC 81 MG tablet Take 1 tablet (81 mg total) by mouth daily. Swallow whole. 10/30/19   Lelon Perla, MD  carbidopa-levodopa (SINEMET CR) 50-200 MG tablet TAKE 1 TABLET BY MOUTH EVERYDAY AT BEDTIME 04/07/21   Tat, Eustace Quail, DO  Carbidopa-Levodopa ER (SINEMET CR) 25-100 MG tablet controlled release TAKE 2 TABLETS BY MOUTH 5 TIMES PER DAY AS DIRECTED 06/01/21   Tat, Eustace Quail, DO  Cholecalciferol (VITAMIN D) 50 MCG (2000 UT) tablet Take 2,000 Units by mouth daily.     [provider]  colchicine 0.6 MG tablet Take 1 tablet (0.6 mg total) by mouth daily as needed (gout). 05/13/20   Tonia Ghent, MD  Continuous Blood Gluc Receiver (DEXCOM G6 RECEIVER) DEVI Use to check blood sugar daily 02/24/21   Elayne Snare, MD  Continuous Blood Gluc Sensor (DEXCOM G6 SENSOR) MISC Use to check blood sugar daily 02/24/21   Elayne Snare, MD  Continuous Blood  Gluc Transmit (DEXCOM G6 TRANSMITTER) MISC Change every 3 months 02/24/21   Elayne Snare, MD  dorzolamide-timolol (COSOPT) 22.3-6.8 MG/ML ophthalmic solution Place 1 drop into the right eye 2 (two) times daily. 08/29/19   [provider]  DULoxetine (CYMBALTA) 30 MG capsule Take 1 capsule (30 mg total) by mouth daily. 05/31/21   Tat, Eustace Quail, DO  fluticasone-salmeterol (ADVAIR DISKUS) 250-50 MCG/ACT AEPB Inhale 1 puff into the lungs in the morning and at bedtime. Rinse after use. 09/14/20   Tonia Ghent, MD  FREESTYLE LITE test strip USE TO CHECK BLOOD SUGAR 4 TIMES DAILY. 04/13/20   Elayne Snare, MD  furosemide (LASIX) 80 MG tablet TAKE 1 TABLET BY MOUTH EVERY DAY 04/17/21   Tonia Ghent, MD  gabapentin (NEURONTIN) 100 MG capsule 1 in the AM, 2 in the PM 05/31/21   Tat, Rebecca S, DO  HUMALOG 100 UNIT/ML injection USE MAXIMUM 76 UNITS PER   DAY WITH V-GO PUMP 03/09/21   Elayne Snare, MD  Insulin Disposable Pump (OMNIPOD 5 G6 INTRO, GEN 5,) KIT 1 kit by Does not apply route daily. 02/24/21   Elayne Snare, MD  Insulin Disposable Pump (OMNIPOD 5 G6 POD, GEN 5,) MISC 1 each by Does not apply route every 3 (three) days. 02/24/21   Elayne Snare, MD  KLOR-CON M20 20 MEQ tablet  TAKE 3 TABLETS (60 MEQ TOTAL) BY MOUTH DAILY. 02/14/21   Tonia Ghent, MD  levothyroxine (SYNTHROID) 137 MCG tablet TAKE 1 TABLET BY MOUTH EVERY DAY BEFORE BREAKFAST 02/17/21   Elayne Snare, MD  losartan (COZAAR) 25 MG tablet Take 1 tablet (25 mg total) by mouth daily. 12/24/20 05/31/21  Lelon Perla, MD  metoprolol succinate (TOPROL-XL) 50 MG 24 hr tablet TAKE 1 TABLET BY MOUTH DAILY. TAKE WITH OR IMMEDIATELY FOLLOWING A MEAL. 11/19/20   Tonia Ghent, MD  MYRBETRIQ 25 MG TB24 tablet TAKE 1 TABLET BY MOUTH EVERY DAY 12/16/20   Tonia Ghent, MD  nitroGLYCERIN (NITROSTAT) 0.4 MG SL tablet Place 1 tablet (0.4 mg total) under the tongue every 5 (five) minutes as needed for chest pain. 10/30/19 05/31/21  Lelon Perla, MD   omega-3 acid ethyl esters (LOVAZA) 1 g capsule TAKE 2 CAPSULES BY MOUTH TWICE A DAY 05/16/21   Elayne Snare, MD  OVER THE COUNTER MEDICATION Apply 1 application topically daily as needed (pain). Theraworx Pain Cream    [provider]  OZEMPIC, 1 MG/DOSE, 4 MG/3ML SOPN INJECT $RemoveBef'1MG'LQyfqauWph$  SUBCUTANEOUSLY  ONCE A WEEK. 03/09/21   Elayne Snare, MD  polyethylene glycol (MIRALAX / GLYCOLAX) packet Take 8.5 g by mouth daily.    [provider]  rosuvastatin (CRESTOR) 20 MG tablet TAKE 1 TABLET BY MOUTH EVERYDAY AT BEDTIME 04/14/21   Elayne Snare, MD  traMADol (ULTRAM) 50 MG tablet TAKE 1 TABLET BY MOUTH TWICE A DAY 04/17/21   Tonia Ghent, MD  vitamin B-12 (CYANOCOBALAMIN) 1000 MCG tablet Take 1,000 mcg by mouth daily.    [provider]      Allergies    Penicillins, Klonopin [clonazepam], Peanut-containing drug products, and Watermelon [citrullus vulgaris]    Review of Systems   Review of Systems  Physical Exam Updated Vital Signs BP 132/81 (BP Location: Left Arm)   Pulse 71   Temp (!) 97.5 F (36.4 C) (Oral)   Resp 18   Ht $R'5\' 8"'uF$  (1.727 m)   Wt 120.2 kg   SpO2 96%   BMI 40.29 kg/m  Physical Exam Vitals and nursing note reviewed.  HENT:     Right Ear: Ear canal normal.     Ears:      Comments: laceration upon upper part of ear.  It does involve cartilage.  Small amount of hematoma expressed Cardiovascular:     Rate and Rhythm: Regular rhythm.  Musculoskeletal:        General: No tenderness.     Cervical back: Neck supple.  Skin:    Capillary Refill: Capillary refill takes less than 2 seconds.  Neurological:     Mental Status: He is alert.    ED Results / Procedures / Treatments   Labs (all labs ordered are listed, but only abnormal results are displayed) Labs Reviewed - No data to display  EKG None  Radiology No results found.  Procedures Procedures    Medications Ordered in ED Medications  lidocaine (XYLOCAINE) 2 % (with pres) injection 400 mg  (has no administration in time range)  lidocaine-EPINEPHrine-tetracaine (LET) topical gel (3 mLs Topical Given 06/07/21 1619)    ED Course/ Medical Decision Making/ A&P                           Medical Decision Making Risk Prescription drug management.   Patient with fall.  Hit right ear.  Complex laceration with involvement  of cartilage.  Closed by PA.  Doubt intracranial injury.  Do not feel I need imaging at this time.  Follow-up with PCP.  Can be referred to ENT later if needs further specific management by them.  No other injury.  Mechanical fall.        Final Clinical Impression(s) / ED Diagnoses Final diagnoses:  Fall, initial encounter  Complex laceration of ear, right, initial encounter    Rx / DC Orders ED Discharge Orders     None         Davonna Belling, MD 06/07/21 2327

## 2021-06-07 NOTE — Discharge Instructions (Addendum)
You were seen in the emergency department for ear laceration after a fall.   We have closed your laceration with sutures. These sutures should absorb on their own, but I want your doctor to do a wound check within the next 7 days.   If any of the sutures come out before you see your doctor, that is okay. Make sure to keep the area as clean and dry as possible. You can let warm soapy warm run over the area, but do NOT scrub it.   Watch out for signs of infection, like we discussed, including: increased redness, tenderness, or drainage of pus from the area. If this happens and you have not been prescribed an antibiotic, please seek medical attention for possible infection.   You can take over the counter pain medicine like ibuprofen or tylenol as needed.

## 2021-06-07 NOTE — ED Notes (Signed)
Reviewed AVS/discharge instruction with patient and wife. Time allotted for and all questions answered. Patient is agreeable for d/c and escorted to ed exit by staff.

## 2021-06-07 NOTE — ED Provider Notes (Signed)
  Procedures  .Marland KitchenLaceration Repair  Date/Time: 06/07/2021 7:08 PM Performed by: Kateri Plummer, PA-C Authorized by: Kateri Plummer, PA-C   Consent:    Consent obtained:  Verbal   Consent given by:  Patient   Risks discussed:  Infection, need for additional repair, pain, poor cosmetic result and poor wound healing   Alternatives discussed:  No treatment and delayed treatment Universal protocol:    Procedure explained and questions answered to patient or proxy's satisfaction: yes     Relevant documents present and verified: yes     Test results available: yes     Imaging studies available: yes     Required blood products, implants, devices, and special equipment available: yes     Site/side marked: yes     Immediately prior to procedure, a time out was called: yes     Patient identity confirmed:  Verbally with patient Anesthesia:    Anesthesia method:  Local infiltration and topical application   Topical anesthetic:  LET   Local anesthetic:  Lidocaine 2% w/o epi Laceration details:    Location:  Ear   Ear location:  R ear   Length (cm):  7 Pre-procedure details:    Preparation:  Patient was prepped and draped in usual sterile fashion Exploration:    Hemostasis achieved with:  LET and direct pressure Treatment:    Area cleansed with:  Saline   Amount of cleaning:  Standard   Irrigation solution:  Sterile water   Debridement:  None   Undermining:  Minimal Skin repair:    Repair method:  Sutures   Suture size:  6-0   Wound skin closure material used: vicryl.   Suture technique:  Simple interrupted   Number of sutures:  16 Approximation:    Approximation:  Close Repair type:    Repair type:  Intermediate Post-procedure details:    Dressing:  Non-adherent dressing   Procedure completion:  Tolerated well, no immediate complications          Kateri Plummer, PA-C 06/07/21 1910    Davonna Belling, MD 06/07/21 2330

## 2021-06-07 NOTE — ED Notes (Signed)
Provider at the Bedside. 

## 2021-06-07 NOTE — ED Triage Notes (Addendum)
Patient reports to the ER for mechanical fall. Patient is BIB EMS from home. Patient has a laceration to the right ear from hitting the fridge. No LOC. Did hit head on the door hinge. No blood thinners.

## 2021-06-09 ENCOUNTER — Encounter: Payer: Self-pay | Admitting: Endocrinology

## 2021-06-09 ENCOUNTER — Encounter: Payer: Medicare Other | Admitting: Endocrinology

## 2021-06-09 ENCOUNTER — Other Ambulatory Visit: Payer: Self-pay | Admitting: Family Medicine

## 2021-06-09 DIAGNOSIS — M109 Gout, unspecified: Secondary | ICD-10-CM

## 2021-06-09 NOTE — Progress Notes (Signed)
This encounter was created in error - please disregard.

## 2021-06-13 ENCOUNTER — Ambulatory Visit (INDEPENDENT_AMBULATORY_CARE_PROVIDER_SITE_OTHER): Payer: Medicare Other | Admitting: Family Medicine

## 2021-06-13 ENCOUNTER — Encounter: Payer: Self-pay | Admitting: Family Medicine

## 2021-06-13 DIAGNOSIS — I255 Ischemic cardiomyopathy: Secondary | ICD-10-CM | POA: Diagnosis not present

## 2021-06-13 DIAGNOSIS — W19XXXD Unspecified fall, subsequent encounter: Secondary | ICD-10-CM | POA: Diagnosis not present

## 2021-06-13 DIAGNOSIS — W19XXXS Unspecified fall, sequela: Secondary | ICD-10-CM

## 2021-06-13 DIAGNOSIS — S01319D Laceration without foreign body of unspecified ear, subsequent encounter: Secondary | ICD-10-CM

## 2021-06-13 DIAGNOSIS — G20 Parkinson's disease: Secondary | ICD-10-CM | POA: Diagnosis not present

## 2021-06-13 DIAGNOSIS — Y92009 Unspecified place in unspecified non-institutional (private) residence as the place of occurrence of the external cause: Secondary | ICD-10-CM

## 2021-06-13 DIAGNOSIS — G20A1 Parkinson's disease without dyskinesia, without mention of fluctuations: Secondary | ICD-10-CM

## 2021-06-13 NOTE — Patient Instructions (Signed)
The sutures should break down and come out.  I would try using a warm compress a few times a day and the scab should flake off.  Update me as needed.  Okay to cover at night.  Take care.  Glad to see you.

## 2021-06-13 NOTE — Progress Notes (Unsigned)
Fell backward, wasn't lightheaded.  Stumbled.  R ear lac.  Then had ear lac repair.  Here for follow up. He has been changing the bandage in the meantime.    Meds, vitals, and allergies reviewed.   ROS: Per HPI unless specifically indicated in ROS section

## 2021-06-15 DIAGNOSIS — S01319A Laceration without foreign body of unspecified ear, initial encounter: Secondary | ICD-10-CM | POA: Insufficient documentation

## 2021-06-15 DIAGNOSIS — Y92009 Unspecified place in unspecified non-institutional (private) residence as the place of occurrence of the external cause: Secondary | ICD-10-CM | POA: Insufficient documentation

## 2021-06-15 NOTE — Assessment & Plan Note (Signed)
Discussed setting up home health PT.  Continue walker use.

## 2021-06-15 NOTE — Assessment & Plan Note (Signed)
Resolving.  The sutures should absorb/breakdown and I think it would make sense to leave them in for now.  He can put a warm compress on the area and see if the scab will flake off.  He can update me as needed.  It does not appear that he needs to see ENT at this point.  Routine fall cautions discussed with patient.  He is using his walker at baseline.

## 2021-06-29 ENCOUNTER — Other Ambulatory Visit: Payer: Self-pay | Admitting: Family Medicine

## 2021-07-01 ENCOUNTER — Telehealth: Payer: Self-pay

## 2021-07-01 ENCOUNTER — Other Ambulatory Visit: Payer: Self-pay | Admitting: Family Medicine

## 2021-07-01 NOTE — Telephone Encounter (Signed)
Refill request Myrbetriq Last office visit 06/13/21 acute Last refill 12/16/20 Please advise when patient is due to have a CPE

## 2021-07-02 DIAGNOSIS — G20 Parkinson's disease: Secondary | ICD-10-CM | POA: Diagnosis not present

## 2021-07-02 DIAGNOSIS — M109 Gout, unspecified: Secondary | ICD-10-CM | POA: Diagnosis not present

## 2021-07-02 DIAGNOSIS — Z87442 Personal history of urinary calculi: Secondary | ICD-10-CM | POA: Diagnosis not present

## 2021-07-02 DIAGNOSIS — E038 Other specified hypothyroidism: Secondary | ICD-10-CM | POA: Diagnosis not present

## 2021-07-02 DIAGNOSIS — N183 Chronic kidney disease, stage 3 unspecified: Secondary | ICD-10-CM | POA: Diagnosis not present

## 2021-07-02 DIAGNOSIS — Z86010 Personal history of colonic polyps: Secondary | ICD-10-CM | POA: Diagnosis not present

## 2021-07-02 DIAGNOSIS — I2510 Atherosclerotic heart disease of native coronary artery without angina pectoris: Secondary | ICD-10-CM | POA: Diagnosis not present

## 2021-07-02 DIAGNOSIS — Z794 Long term (current) use of insulin: Secondary | ICD-10-CM | POA: Diagnosis not present

## 2021-07-02 DIAGNOSIS — N401 Enlarged prostate with lower urinary tract symptoms: Secondary | ICD-10-CM | POA: Diagnosis not present

## 2021-07-02 DIAGNOSIS — I130 Hypertensive heart and chronic kidney disease with heart failure and stage 1 through stage 4 chronic kidney disease, or unspecified chronic kidney disease: Secondary | ICD-10-CM | POA: Diagnosis not present

## 2021-07-02 DIAGNOSIS — I5042 Chronic combined systolic (congestive) and diastolic (congestive) heart failure: Secondary | ICD-10-CM | POA: Diagnosis not present

## 2021-07-02 DIAGNOSIS — K449 Diaphragmatic hernia without obstruction or gangrene: Secondary | ICD-10-CM | POA: Diagnosis not present

## 2021-07-02 DIAGNOSIS — Z951 Presence of aortocoronary bypass graft: Secondary | ICD-10-CM | POA: Diagnosis not present

## 2021-07-02 DIAGNOSIS — R1310 Dysphagia, unspecified: Secondary | ICD-10-CM | POA: Diagnosis not present

## 2021-07-02 DIAGNOSIS — G4733 Obstructive sleep apnea (adult) (pediatric): Secondary | ICD-10-CM | POA: Diagnosis not present

## 2021-07-02 DIAGNOSIS — E785 Hyperlipidemia, unspecified: Secondary | ICD-10-CM | POA: Diagnosis not present

## 2021-07-02 DIAGNOSIS — G2581 Restless legs syndrome: Secondary | ICD-10-CM | POA: Diagnosis not present

## 2021-07-02 DIAGNOSIS — R350 Frequency of micturition: Secondary | ICD-10-CM | POA: Diagnosis not present

## 2021-07-02 DIAGNOSIS — E1142 Type 2 diabetes mellitus with diabetic polyneuropathy: Secondary | ICD-10-CM | POA: Diagnosis not present

## 2021-07-02 DIAGNOSIS — S01311D Laceration without foreign body of right ear, subsequent encounter: Secondary | ICD-10-CM | POA: Diagnosis not present

## 2021-07-02 DIAGNOSIS — E1122 Type 2 diabetes mellitus with diabetic chronic kidney disease: Secondary | ICD-10-CM | POA: Diagnosis not present

## 2021-07-02 DIAGNOSIS — J45909 Unspecified asthma, uncomplicated: Secondary | ICD-10-CM | POA: Diagnosis not present

## 2021-07-02 DIAGNOSIS — K219 Gastro-esophageal reflux disease without esophagitis: Secondary | ICD-10-CM | POA: Diagnosis not present

## 2021-07-02 DIAGNOSIS — M48061 Spinal stenosis, lumbar region without neurogenic claudication: Secondary | ICD-10-CM | POA: Diagnosis not present

## 2021-07-06 ENCOUNTER — Other Ambulatory Visit: Payer: Self-pay | Admitting: Neurology

## 2021-07-06 DIAGNOSIS — G20 Parkinson's disease: Secondary | ICD-10-CM

## 2021-07-06 DIAGNOSIS — G20A1 Parkinson's disease without dyskinesia, without mention of fluctuations: Secondary | ICD-10-CM

## 2021-07-06 DIAGNOSIS — I5042 Chronic combined systolic (congestive) and diastolic (congestive) heart failure: Secondary | ICD-10-CM | POA: Diagnosis not present

## 2021-07-06 DIAGNOSIS — S01311D Laceration without foreign body of right ear, subsequent encounter: Secondary | ICD-10-CM | POA: Diagnosis not present

## 2021-07-06 DIAGNOSIS — E1142 Type 2 diabetes mellitus with diabetic polyneuropathy: Secondary | ICD-10-CM | POA: Diagnosis not present

## 2021-07-06 DIAGNOSIS — I2510 Atherosclerotic heart disease of native coronary artery without angina pectoris: Secondary | ICD-10-CM | POA: Diagnosis not present

## 2021-07-06 DIAGNOSIS — I130 Hypertensive heart and chronic kidney disease with heart failure and stage 1 through stage 4 chronic kidney disease, or unspecified chronic kidney disease: Secondary | ICD-10-CM | POA: Diagnosis not present

## 2021-07-08 NOTE — Telephone Encounter (Signed)
Wife called in states [facility] is requesting chart notes be faxed to [phone]. Faxed them over today.

## 2021-07-14 ENCOUNTER — Telehealth: Payer: Self-pay

## 2021-07-14 NOTE — Telephone Encounter (Signed)
Orders were received and given to Dr. Damita Dunnings whom signed them today. I have faxed this over just now.

## 2021-07-14 NOTE — Telephone Encounter (Signed)
Alma Friendly called from [facility], wondering if we received the [facility] orders for plan of care that were faxed on 6/30, asked [place] to fax them [phone].

## 2021-07-15 DIAGNOSIS — I2510 Atherosclerotic heart disease of native coronary artery without angina pectoris: Secondary | ICD-10-CM | POA: Diagnosis not present

## 2021-07-15 DIAGNOSIS — S01311D Laceration without foreign body of right ear, subsequent encounter: Secondary | ICD-10-CM | POA: Diagnosis not present

## 2021-07-15 DIAGNOSIS — I5042 Chronic combined systolic (congestive) and diastolic (congestive) heart failure: Secondary | ICD-10-CM | POA: Diagnosis not present

## 2021-07-15 DIAGNOSIS — I130 Hypertensive heart and chronic kidney disease with heart failure and stage 1 through stage 4 chronic kidney disease, or unspecified chronic kidney disease: Secondary | ICD-10-CM | POA: Diagnosis not present

## 2021-07-15 DIAGNOSIS — E1142 Type 2 diabetes mellitus with diabetic polyneuropathy: Secondary | ICD-10-CM | POA: Diagnosis not present

## 2021-07-15 DIAGNOSIS — G20 Parkinson's disease: Secondary | ICD-10-CM | POA: Diagnosis not present

## 2021-07-18 ENCOUNTER — Other Ambulatory Visit: Payer: Self-pay | Admitting: Endocrinology

## 2021-07-18 ENCOUNTER — Other Ambulatory Visit: Payer: Self-pay | Admitting: Family Medicine

## 2021-07-18 DIAGNOSIS — E890 Postprocedural hypothyroidism: Secondary | ICD-10-CM

## 2021-07-18 DIAGNOSIS — I509 Heart failure, unspecified: Secondary | ICD-10-CM

## 2021-07-18 NOTE — Telephone Encounter (Signed)
Refill request for Tramadol 50 mg tablets  LOV - 06/13/21 Next OV - not scheduled Last refill - 04/17/21 #60/2

## 2021-07-20 ENCOUNTER — Ambulatory Visit (INDEPENDENT_AMBULATORY_CARE_PROVIDER_SITE_OTHER): Payer: Medicare Other

## 2021-07-20 DIAGNOSIS — I255 Ischemic cardiomyopathy: Secondary | ICD-10-CM

## 2021-07-20 LAB — CUP PACEART REMOTE DEVICE CHECK
Battery Remaining Longevity: 33 mo
Battery Voltage: 2.95 V
Brady Statistic AP VP Percent: 0.01 %
Brady Statistic AP VS Percent: 2.81 %
Brady Statistic AS VP Percent: 0.08 %
Brady Statistic AS VS Percent: 97.1 %
Brady Statistic RA Percent Paced: 2.79 %
Brady Statistic RV Percent Paced: 0.08 %
Date Time Interrogation Session: 20230712033325
HighPow Impedance: 73 Ohm
Implantable Lead Implant Date: 20151105
Implantable Lead Implant Date: 20151105
Implantable Lead Location: 753859
Implantable Lead Location: 753860
Implantable Lead Model: 5076
Implantable Pulse Generator Implant Date: 20151105
Lead Channel Impedance Value: 437 Ohm
Lead Channel Impedance Value: 437 Ohm
Lead Channel Impedance Value: 494 Ohm
Lead Channel Pacing Threshold Amplitude: 0.375 V
Lead Channel Pacing Threshold Amplitude: 1 V
Lead Channel Pacing Threshold Pulse Width: 0.4 ms
Lead Channel Pacing Threshold Pulse Width: 0.4 ms
Lead Channel Sensing Intrinsic Amplitude: 18.375 mV
Lead Channel Sensing Intrinsic Amplitude: 18.375 mV
Lead Channel Sensing Intrinsic Amplitude: 2.125 mV
Lead Channel Sensing Intrinsic Amplitude: 2.125 mV
Lead Channel Setting Pacing Amplitude: 2.25 V
Lead Channel Setting Pacing Amplitude: 2.5 V
Lead Channel Setting Pacing Pulse Width: 0.4 ms
Lead Channel Setting Sensing Sensitivity: 0.3 mV

## 2021-07-21 DIAGNOSIS — I5042 Chronic combined systolic (congestive) and diastolic (congestive) heart failure: Secondary | ICD-10-CM | POA: Diagnosis not present

## 2021-07-21 DIAGNOSIS — I2510 Atherosclerotic heart disease of native coronary artery without angina pectoris: Secondary | ICD-10-CM | POA: Diagnosis not present

## 2021-07-21 DIAGNOSIS — G20 Parkinson's disease: Secondary | ICD-10-CM | POA: Diagnosis not present

## 2021-07-21 DIAGNOSIS — S01311D Laceration without foreign body of right ear, subsequent encounter: Secondary | ICD-10-CM | POA: Diagnosis not present

## 2021-07-21 DIAGNOSIS — I130 Hypertensive heart and chronic kidney disease with heart failure and stage 1 through stage 4 chronic kidney disease, or unspecified chronic kidney disease: Secondary | ICD-10-CM | POA: Diagnosis not present

## 2021-07-21 DIAGNOSIS — E1142 Type 2 diabetes mellitus with diabetic polyneuropathy: Secondary | ICD-10-CM | POA: Diagnosis not present

## 2021-07-26 DIAGNOSIS — I5042 Chronic combined systolic (congestive) and diastolic (congestive) heart failure: Secondary | ICD-10-CM | POA: Diagnosis not present

## 2021-07-26 DIAGNOSIS — I2510 Atherosclerotic heart disease of native coronary artery without angina pectoris: Secondary | ICD-10-CM | POA: Diagnosis not present

## 2021-07-26 DIAGNOSIS — I130 Hypertensive heart and chronic kidney disease with heart failure and stage 1 through stage 4 chronic kidney disease, or unspecified chronic kidney disease: Secondary | ICD-10-CM | POA: Diagnosis not present

## 2021-07-26 DIAGNOSIS — G20 Parkinson's disease: Secondary | ICD-10-CM | POA: Diagnosis not present

## 2021-07-26 DIAGNOSIS — E1142 Type 2 diabetes mellitus with diabetic polyneuropathy: Secondary | ICD-10-CM | POA: Diagnosis not present

## 2021-07-26 DIAGNOSIS — S01311D Laceration without foreign body of right ear, subsequent encounter: Secondary | ICD-10-CM | POA: Diagnosis not present

## 2021-07-31 DIAGNOSIS — G2581 Restless legs syndrome: Secondary | ICD-10-CM

## 2021-07-31 DIAGNOSIS — E038 Other specified hypothyroidism: Secondary | ICD-10-CM | POA: Diagnosis not present

## 2021-07-31 DIAGNOSIS — Z6839 Body mass index (BMI) 39.0-39.9, adult: Secondary | ICD-10-CM

## 2021-07-31 DIAGNOSIS — Z951 Presence of aortocoronary bypass graft: Secondary | ICD-10-CM

## 2021-07-31 DIAGNOSIS — I5042 Chronic combined systolic (congestive) and diastolic (congestive) heart failure: Secondary | ICD-10-CM | POA: Diagnosis not present

## 2021-07-31 DIAGNOSIS — R350 Frequency of micturition: Secondary | ICD-10-CM

## 2021-07-31 DIAGNOSIS — Z7982 Long term (current) use of aspirin: Secondary | ICD-10-CM

## 2021-07-31 DIAGNOSIS — G4733 Obstructive sleep apnea (adult) (pediatric): Secondary | ICD-10-CM | POA: Diagnosis not present

## 2021-07-31 DIAGNOSIS — Z79899 Other long term (current) drug therapy: Secondary | ICD-10-CM

## 2021-07-31 DIAGNOSIS — R1310 Dysphagia, unspecified: Secondary | ICD-10-CM

## 2021-07-31 DIAGNOSIS — Z87442 Personal history of urinary calculi: Secondary | ICD-10-CM

## 2021-07-31 DIAGNOSIS — Z9181 History of falling: Secondary | ICD-10-CM

## 2021-07-31 DIAGNOSIS — I2510 Atherosclerotic heart disease of native coronary artery without angina pectoris: Secondary | ICD-10-CM | POA: Diagnosis not present

## 2021-07-31 DIAGNOSIS — J45909 Unspecified asthma, uncomplicated: Secondary | ICD-10-CM | POA: Diagnosis not present

## 2021-07-31 DIAGNOSIS — E1122 Type 2 diabetes mellitus with diabetic chronic kidney disease: Secondary | ICD-10-CM | POA: Diagnosis not present

## 2021-07-31 DIAGNOSIS — M109 Gout, unspecified: Secondary | ICD-10-CM

## 2021-07-31 DIAGNOSIS — N401 Enlarged prostate with lower urinary tract symptoms: Secondary | ICD-10-CM

## 2021-07-31 DIAGNOSIS — Z86010 Personal history of colonic polyps: Secondary | ICD-10-CM

## 2021-07-31 DIAGNOSIS — Z79891 Long term (current) use of opiate analgesic: Secondary | ICD-10-CM

## 2021-07-31 DIAGNOSIS — E1142 Type 2 diabetes mellitus with diabetic polyneuropathy: Secondary | ICD-10-CM | POA: Diagnosis not present

## 2021-07-31 DIAGNOSIS — M48061 Spinal stenosis, lumbar region without neurogenic claudication: Secondary | ICD-10-CM

## 2021-07-31 DIAGNOSIS — Z7985 Long-term (current) use of injectable non-insulin antidiabetic drugs: Secondary | ICD-10-CM

## 2021-07-31 DIAGNOSIS — I130 Hypertensive heart and chronic kidney disease with heart failure and stage 1 through stage 4 chronic kidney disease, or unspecified chronic kidney disease: Secondary | ICD-10-CM | POA: Diagnosis not present

## 2021-07-31 DIAGNOSIS — K219 Gastro-esophageal reflux disease without esophagitis: Secondary | ICD-10-CM | POA: Diagnosis not present

## 2021-07-31 DIAGNOSIS — G20 Parkinson's disease: Secondary | ICD-10-CM | POA: Diagnosis not present

## 2021-07-31 DIAGNOSIS — Z794 Long term (current) use of insulin: Secondary | ICD-10-CM

## 2021-07-31 DIAGNOSIS — E785 Hyperlipidemia, unspecified: Secondary | ICD-10-CM

## 2021-07-31 DIAGNOSIS — N183 Chronic kidney disease, stage 3 unspecified: Secondary | ICD-10-CM | POA: Diagnosis not present

## 2021-07-31 DIAGNOSIS — S01311D Laceration without foreign body of right ear, subsequent encounter: Secondary | ICD-10-CM | POA: Diagnosis not present

## 2021-07-31 DIAGNOSIS — K449 Diaphragmatic hernia without obstruction or gangrene: Secondary | ICD-10-CM

## 2021-08-01 ENCOUNTER — Telehealth: Payer: Self-pay | Admitting: Family Medicine

## 2021-08-01 DIAGNOSIS — E785 Hyperlipidemia, unspecified: Secondary | ICD-10-CM | POA: Diagnosis not present

## 2021-08-01 DIAGNOSIS — R1310 Dysphagia, unspecified: Secondary | ICD-10-CM | POA: Diagnosis not present

## 2021-08-01 DIAGNOSIS — K219 Gastro-esophageal reflux disease without esophagitis: Secondary | ICD-10-CM | POA: Diagnosis not present

## 2021-08-01 DIAGNOSIS — Z86010 Personal history of colonic polyps: Secondary | ICD-10-CM | POA: Diagnosis not present

## 2021-08-01 DIAGNOSIS — N401 Enlarged prostate with lower urinary tract symptoms: Secondary | ICD-10-CM | POA: Diagnosis not present

## 2021-08-01 DIAGNOSIS — M48061 Spinal stenosis, lumbar region without neurogenic claudication: Secondary | ICD-10-CM | POA: Diagnosis not present

## 2021-08-01 DIAGNOSIS — S01311D Laceration without foreign body of right ear, subsequent encounter: Secondary | ICD-10-CM | POA: Diagnosis not present

## 2021-08-01 DIAGNOSIS — I130 Hypertensive heart and chronic kidney disease with heart failure and stage 1 through stage 4 chronic kidney disease, or unspecified chronic kidney disease: Secondary | ICD-10-CM | POA: Diagnosis not present

## 2021-08-01 DIAGNOSIS — J45909 Unspecified asthma, uncomplicated: Secondary | ICD-10-CM | POA: Diagnosis not present

## 2021-08-01 DIAGNOSIS — I2510 Atherosclerotic heart disease of native coronary artery without angina pectoris: Secondary | ICD-10-CM | POA: Diagnosis not present

## 2021-08-01 DIAGNOSIS — R350 Frequency of micturition: Secondary | ICD-10-CM | POA: Diagnosis not present

## 2021-08-01 DIAGNOSIS — K449 Diaphragmatic hernia without obstruction or gangrene: Secondary | ICD-10-CM | POA: Diagnosis not present

## 2021-08-01 DIAGNOSIS — M109 Gout, unspecified: Secondary | ICD-10-CM | POA: Diagnosis not present

## 2021-08-01 DIAGNOSIS — Z87442 Personal history of urinary calculi: Secondary | ICD-10-CM | POA: Diagnosis not present

## 2021-08-01 DIAGNOSIS — E038 Other specified hypothyroidism: Secondary | ICD-10-CM | POA: Diagnosis not present

## 2021-08-01 DIAGNOSIS — E1142 Type 2 diabetes mellitus with diabetic polyneuropathy: Secondary | ICD-10-CM | POA: Diagnosis not present

## 2021-08-01 DIAGNOSIS — G4733 Obstructive sleep apnea (adult) (pediatric): Secondary | ICD-10-CM | POA: Diagnosis not present

## 2021-08-01 DIAGNOSIS — Z794 Long term (current) use of insulin: Secondary | ICD-10-CM | POA: Diagnosis not present

## 2021-08-01 DIAGNOSIS — N183 Chronic kidney disease, stage 3 unspecified: Secondary | ICD-10-CM | POA: Diagnosis not present

## 2021-08-01 DIAGNOSIS — Z951 Presence of aortocoronary bypass graft: Secondary | ICD-10-CM | POA: Diagnosis not present

## 2021-08-01 DIAGNOSIS — G2581 Restless legs syndrome: Secondary | ICD-10-CM | POA: Diagnosis not present

## 2021-08-01 DIAGNOSIS — I5042 Chronic combined systolic (congestive) and diastolic (congestive) heart failure: Secondary | ICD-10-CM | POA: Diagnosis not present

## 2021-08-01 DIAGNOSIS — E1122 Type 2 diabetes mellitus with diabetic chronic kidney disease: Secondary | ICD-10-CM | POA: Diagnosis not present

## 2021-08-01 DIAGNOSIS — G20 Parkinson's disease: Secondary | ICD-10-CM | POA: Diagnosis not present

## 2021-08-01 NOTE — Telephone Encounter (Signed)
FYI

## 2021-08-01 NOTE — Telephone Encounter (Signed)
Tillie Rung from [facility] called in stating that Henry [name] fell on Saturday going to use the bathroom. She stated he did not hurt himself. Thank you!

## 2021-08-02 NOTE — Telephone Encounter (Signed)
Noted. Thanks.

## 2021-08-08 NOTE — Progress Notes (Signed)
Remote ICD transmission.   

## 2021-08-11 ENCOUNTER — Encounter: Payer: Self-pay | Admitting: Endocrinology

## 2021-08-12 NOTE — Telephone Encounter (Signed)
Spoke with patient and explained to her that company will not send out supplies for Dexcom because per medicare patient has to be seen in office every 3 months an he has not been seen since January. Patient has follow up on 09/05/21. States she will continue to do fingerstick and put number into pump so patient can get insulin until appt.

## 2021-08-12 NOTE — Telephone Encounter (Signed)
Patient wife called in requesting chart notes be faxed to [facility] now. Informed I have faxed them twice and this will be the third time. Faxed to [phone]. I did inform patient that medicare requires appt every 3 months and he has not been seen since January. Patient has a follow up on 09/05/21.

## 2021-08-15 DIAGNOSIS — S01311D Laceration without foreign body of right ear, subsequent encounter: Secondary | ICD-10-CM | POA: Diagnosis not present

## 2021-08-15 DIAGNOSIS — I5042 Chronic combined systolic (congestive) and diastolic (congestive) heart failure: Secondary | ICD-10-CM | POA: Diagnosis not present

## 2021-08-15 DIAGNOSIS — G20 Parkinson's disease: Secondary | ICD-10-CM | POA: Diagnosis not present

## 2021-08-15 DIAGNOSIS — I2510 Atherosclerotic heart disease of native coronary artery without angina pectoris: Secondary | ICD-10-CM | POA: Diagnosis not present

## 2021-08-15 DIAGNOSIS — E1142 Type 2 diabetes mellitus with diabetic polyneuropathy: Secondary | ICD-10-CM | POA: Diagnosis not present

## 2021-08-15 DIAGNOSIS — I130 Hypertensive heart and chronic kidney disease with heart failure and stage 1 through stage 4 chronic kidney disease, or unspecified chronic kidney disease: Secondary | ICD-10-CM | POA: Diagnosis not present

## 2021-08-20 ENCOUNTER — Other Ambulatory Visit: Payer: Self-pay | Admitting: Neurology

## 2021-08-20 ENCOUNTER — Other Ambulatory Visit: Payer: Self-pay | Admitting: Endocrinology

## 2021-08-20 ENCOUNTER — Other Ambulatory Visit: Payer: Self-pay | Admitting: Family Medicine

## 2021-08-20 DIAGNOSIS — E1165 Type 2 diabetes mellitus with hyperglycemia: Secondary | ICD-10-CM

## 2021-08-29 DIAGNOSIS — S01311D Laceration without foreign body of right ear, subsequent encounter: Secondary | ICD-10-CM | POA: Diagnosis not present

## 2021-08-29 DIAGNOSIS — I2510 Atherosclerotic heart disease of native coronary artery without angina pectoris: Secondary | ICD-10-CM | POA: Diagnosis not present

## 2021-08-29 DIAGNOSIS — E1142 Type 2 diabetes mellitus with diabetic polyneuropathy: Secondary | ICD-10-CM | POA: Diagnosis not present

## 2021-08-29 DIAGNOSIS — I5042 Chronic combined systolic (congestive) and diastolic (congestive) heart failure: Secondary | ICD-10-CM | POA: Diagnosis not present

## 2021-08-29 DIAGNOSIS — I130 Hypertensive heart and chronic kidney disease with heart failure and stage 1 through stage 4 chronic kidney disease, or unspecified chronic kidney disease: Secondary | ICD-10-CM | POA: Diagnosis not present

## 2021-08-29 DIAGNOSIS — G20 Parkinson's disease: Secondary | ICD-10-CM | POA: Diagnosis not present

## 2021-08-30 ENCOUNTER — Telehealth: Payer: Self-pay | Admitting: Family Medicine

## 2021-08-30 ENCOUNTER — Other Ambulatory Visit: Payer: Self-pay

## 2021-08-30 ENCOUNTER — Encounter: Payer: Self-pay | Admitting: Family Medicine

## 2021-08-30 ENCOUNTER — Other Ambulatory Visit: Payer: Self-pay | Admitting: Endocrinology

## 2021-08-30 DIAGNOSIS — E890 Postprocedural hypothyroidism: Secondary | ICD-10-CM

## 2021-08-30 DIAGNOSIS — E1165 Type 2 diabetes mellitus with hyperglycemia: Secondary | ICD-10-CM

## 2021-08-30 NOTE — Telephone Encounter (Signed)
Dr. Ronnie Derby lab orders are in; okay for patient to come here for the labs?

## 2021-08-30 NOTE — Telephone Encounter (Signed)
Patient wife called and stated she want to get labs done here per Dr. Dwyane Dee. Call back number [phone].

## 2021-08-31 ENCOUNTER — Encounter: Payer: Self-pay | Admitting: Family Medicine

## 2021-08-31 ENCOUNTER — Ambulatory Visit (INDEPENDENT_AMBULATORY_CARE_PROVIDER_SITE_OTHER): Payer: Medicare Other | Admitting: Family Medicine

## 2021-08-31 ENCOUNTER — Ambulatory Visit (INDEPENDENT_AMBULATORY_CARE_PROVIDER_SITE_OTHER)
Admission: RE | Admit: 2021-08-31 | Discharge: 2021-08-31 | Disposition: A | Discharge status: Home / Self Care | Payer: Medicare Other | Source: Ambulatory Visit | Attending: Family Medicine | Admitting: Family Medicine

## 2021-08-31 VITALS — BP 124/70 | HR 75 | Temp 98.0°F | Ht 68.0 in | Wt 260.4 lb

## 2021-08-31 DIAGNOSIS — E1165 Type 2 diabetes mellitus with hyperglycemia: Secondary | ICD-10-CM

## 2021-08-31 DIAGNOSIS — E890 Postprocedural hypothyroidism: Secondary | ICD-10-CM

## 2021-08-31 DIAGNOSIS — R3129 Other microscopic hematuria: Secondary | ICD-10-CM

## 2021-08-31 DIAGNOSIS — I255 Ischemic cardiomyopathy: Secondary | ICD-10-CM | POA: Diagnosis not present

## 2021-08-31 DIAGNOSIS — M25551 Pain in right hip: Secondary | ICD-10-CM | POA: Insufficient documentation

## 2021-08-31 DIAGNOSIS — Z794 Long term (current) use of insulin: Secondary | ICD-10-CM | POA: Diagnosis not present

## 2021-08-31 DIAGNOSIS — R4182 Altered mental status, unspecified: Secondary | ICD-10-CM | POA: Diagnosis not present

## 2021-08-31 DIAGNOSIS — R319 Hematuria, unspecified: Secondary | ICD-10-CM | POA: Insufficient documentation

## 2021-08-31 LAB — BASIC METABOLIC PANEL
BUN: 21 mg/dL (ref 6–23)
CO2: 31 mEq/L (ref 19–32)
Calcium: 8.6 mg/dL (ref 8.4–10.5)
Chloride: 99 mEq/L (ref 96–112)
Creatinine, Ser: 1.46 mg/dL (ref 0.40–1.50)
GFR: 48.57 mL/min — ABNORMAL LOW (ref >60.00)
Glucose, Bld: 117 mg/dL — ABNORMAL HIGH (ref 70–99)
Potassium: 3.9 mEq/L (ref 3.5–5.1)
Sodium: 140 mEq/L (ref 135–145)

## 2021-08-31 LAB — POC URINALSYSI DIPSTICK (AUTOMATED)
Bilirubin, UA: NEGATIVE
Glucose, UA: NEGATIVE
Leukocytes, UA: NEGATIVE
Nitrite, UA: NEGATIVE
Protein, UA: POSITIVE — AB
Spec Grav, UA: 1.025 (ref 1.010–1.025)
Urobilinogen, UA: 1 E.U./dL
pH, UA: 6 (ref 5.0–8.0)

## 2021-08-31 LAB — HEMOGLOBIN A1C: Hgb A1c MFr Bld: 6.8 % — ABNORMAL HIGH (ref 4.6–6.5)

## 2021-08-31 LAB — TSH: TSH: 5.58 u[IU]/mL — ABNORMAL HIGH (ref 0.35–5.50)

## 2021-08-31 NOTE — Assessment & Plan Note (Addendum)
R lateral hip pain after fall at home 6d ago. Not consistent with hip fracture, however will check hip films --> concern for R ischial fracture given asymmetry noted, will await radiology eval, consider ortho eval.  Supportive measures at this time reviewed including pain control at home (takes tylenol and tramadol).  Xray read as no fracture, I confirmed with radiologist. If ongoing pain, consider hip CT for further evaluation.

## 2021-08-31 NOTE — Telephone Encounter (Signed)
Patient was seen and treated by Dr. Darnell Level this am.

## 2021-08-31 NOTE — Progress Notes (Signed)
Patient ID: Henry [name], male    DOB: 12-Apr-1950, 71 y.o.   MRN: [id number]  This visit was conducted in person.  BP 124/70   Pulse 75   Temp 98 F (36.7 C) (Temporal)   Ht 5\' 8"  (1.727 m)   Wt 260 lb 6 oz (118.1 kg)   SpO2 97%   BMI 39.59 kg/m    CC: R hip pain  Subjective:   HPI: Henry [name] is a 71 y.o. male presenting on 08/31/2021 for Hip Pain (C/o R hip pain due to fell on 08/25/21. Pt accompanied by wife, 08/27/21. Pt's wife concerned pt may have UTI due to being "loopy".  Started yesterday. )   Pt of Dr Okey Regal new to me with h/o AICD, asthma, CAD, DM, HTN, HLD, hypothyroidism, OSA, gout, and parkinson's disease s/p DBS placement (Tat).  DOI: 08/25/2021 Fell at home while walking to recliner, landed on right hip. Has had several falls   He's been receiving HHPT, latest Monday where he was able to walk well, but had to stop early due to pain. R hip pain worsened after this. Normally uses walker for ambulation.   Wife also notes he's a bit more confused than normal - wants to be evaluated for UTI. Started some leftover cipro 500mg  BID yesterday. They feel he's drinking plenty of water.   No dysuria, urgency or frequency, hematuria, nausea or abd pain or diarrhea. No fevers/chills, cough, chest pain, dizziness, HA, palpitations.  No mid back pain.   Chronic dyspnea in h/o asthma on wixela. Known diabetic - sugars overall stable (131 this morning) - sees Dr Thursday endo. Lab Results  Component Value Date   HGBA1C 6.6 (H) 05/31/2021        Relevant past medical, surgical, family and social history reviewed and updated as indicated. Interim medical history since our last visit reviewed. Allergies and medications reviewed and updated. Outpatient Medications Prior to Visit  Medication Sig Dispense Refill   acetaminophen (TYLENOL) 500 MG tablet Take 1,000 mg by mouth at bedtime as needed for mild pain or headache.      albuterol (VENTOLIN HFA) 108 (90 Base) MCG/ACT  inhaler Inhale 2 puffs into the lungs every 6 (six) hours as needed for wheezing or shortness of breath. 18 g 5   allopurinol (ZYLOPRIM) 100 MG tablet TAKE 1 & 1/2 TABLETS BY MOUTH DAILY 135 tablet 3   aspirin EC 81 MG tablet Take 1 tablet (81 mg total) by mouth daily. Swallow whole. 90 tablet 3   carbidopa-levodopa (SINEMET CR) 50-200 MG tablet TAKE 1 TABLET BY MOUTH EVERYDAY AT BEDTIME 90 tablet 0   Carbidopa-Levodopa ER (SINEMET CR) 25-100 MG tablet controlled release TAKE 2 TABLETS BY MOUTH 5 TIMES PER DAY AS DIRECTED 900 tablet 1   Cholecalciferol (VITAMIN D) 50 MCG (2000 UT) tablet Take 2,000 Units by mouth daily.      colchicine 0.6 MG tablet Take 1 tablet (0.6 mg total) by mouth daily as needed (gout).     Continuous Blood Gluc Receiver (DEXCOM G6 RECEIVER) DEVI Use to check blood sugar daily 1 each 0   Continuous Blood Gluc Sensor (DEXCOM G6 SENSOR) MISC Use to check blood sugar daily 9 each 3   Continuous Blood Gluc Transmit (DEXCOM G6 TRANSMITTER) MISC Change every 3 months 1 each 3   dorzolamide-timolol (COSOPT) 22.3-6.8 MG/ML ophthalmic solution Place 1 drop into the right eye 2 (two) times daily.     DULoxetine (CYMBALTA) 30 MG capsule  TAKE 1 CAPSULE BY MOUTH EVERY DAY 90 capsule 1   fluticasone-salmeterol (ADVAIR DISKUS) 250-50 MCG/ACT AEPB Inhale 1 puff into the lungs in the morning and at bedtime. Rinse after use. 180 each 3   FREESTYLE LITE test strip USE TO CHECK BLOOD SUGAR 4 TIMES DAILY. 150 strip 3   furosemide (LASIX) 80 MG tablet TAKE 1 TABLET BY MOUTH EVERY DAY 90 tablet 1   HUMALOG 100 UNIT/ML injection USE MAXIMUM 76 UNITS PER   DAY WITH V-GO PUMP 60 mL 3   Insulin Disposable Pump (OMNIPOD 5 G6 INTRO, GEN 5,) KIT 1 kit by Does not apply route daily. 1 kit 0   Insulin Disposable Pump (OMNIPOD 5 G6 POD, GEN 5,) MISC CHANGE POD EVERY 3 DAYS 10 each 3   KLOR-CON M20 20 MEQ tablet TAKE 3 TABLETS (60 MEQ) BY MOUTH DAILY. 270 tablet 1   levothyroxine (SYNTHROID) 137 MCG tablet  TAKE 1 TABLET BY MOUTH EVERY DAY BEFORE BREAKFAST 90 tablet 1   metoprolol succinate (TOPROL-XL) 50 MG 24 hr tablet TAKE 1 TABLET BY MOUTH EVERY DAY WITH OR IMMEDIATELY FOLLOWING A MEAL 90 tablet 1   MYRBETRIQ 25 MG TB24 tablet TAKE 1 TABLET BY MOUTH EVERY DAY 30 tablet 5   omega-3 acid ethyl esters (LOVAZA) 1 g capsule TAKE 2 CAPSULES BY MOUTH TWICE A DAY 360 capsule 1   OVER THE COUNTER MEDICATION Apply 1 application topically daily as needed (pain). Theraworx Pain Cream     OZEMPIC, 1 MG/DOSE, 4 MG/3ML SOPN INJECT $RemoveBef'1MG'cNEjAPjlHj$  SUBCUTANEOUSLY  ONCE A WEEK. 9 mL 3   polyethylene glycol (MIRALAX / GLYCOLAX) packet Take 8.5 g by mouth daily.     rosuvastatin (CRESTOR) 20 MG tablet TAKE 1 TABLET BY MOUTH EVERYDAY AT BEDTIME 90 tablet 1   traMADol (ULTRAM) 50 MG tablet TAKE 1 TABLET BY MOUTH TWICE A DAY 60 tablet 2   vitamin B-12 (CYANOCOBALAMIN) 1000 MCG tablet Take 1,000 mcg by mouth daily.     losartan (COZAAR) 25 MG tablet Take 1 tablet (25 mg total) by mouth daily. 90 tablet 3   nitroGLYCERIN (NITROSTAT) 0.4 MG SL tablet Place 1 tablet (0.4 mg total) under the tongue every 5 (five) minutes as needed for chest pain. 25 tablet 12   gabapentin (NEURONTIN) 100 MG capsule 1 in the AM, 2 in the PM 540 capsule 1   No facility-administered medications prior to visit.     Per HPI unless specifically indicated in ROS section below Review of Systems  Objective:  BP 124/70   Pulse 75   Temp 98 F (36.7 C) (Temporal)   Ht $R'5\' 8"'Cd$  (1.727 m)   Wt 260 lb 6 oz (118.1 kg)   SpO2 97%   BMI 39.59 kg/m   Wt Readings from Last 3 Encounters:  08/31/21 260 lb 6 oz (118.1 kg)  06/13/21 263 lb (119.3 kg)  06/09/21 265 lb (120.2 kg)      Physical Exam Vitals and nursing note reviewed.  Constitutional:      Appearance: Normal appearance. He is obese. He is not ill-appearing.     Comments: Ambulates with walker  HENT:     Mouth/Throat:     Mouth: Mucous membranes are moist.     Pharynx: Oropharynx is clear. No  oropharyngeal exudate or posterior oropharyngeal erythema.  Cardiovascular:     Rate and Rhythm: Normal rate and regular rhythm.     Pulses: Normal pulses.     Heart sounds: Normal heart sounds. No  murmur heard. Pulmonary:     Effort: Pulmonary effort is normal. No respiratory distress.     Breath sounds: Normal breath sounds. No wheezing, rhonchi or rales.  Abdominal:     General: Bowel sounds are normal. There is no distension.     Palpations: Abdomen is soft. There is no mass.     Tenderness: There is no abdominal tenderness. There is no right CVA tenderness, left CVA tenderness, guarding or rebound.     Hernia: No hernia is present.  Musculoskeletal:     Right lower leg: Edema (1+) present.     Left lower leg: Edema (1+) present.     Comments:  No pain midline spine Neg seated SLR bilaterally. No significant pain with int/ext rotation at hip. No significant pain at SIJ, GTB or sciatic notch bilaterally.  No significant pain with testing hip abductors/adductors, flexors.   Skin:    General: Skin is warm and dry.     Findings: No rash.  Neurological:     Mental Status: He is alert.  Psychiatric:        Mood and Affect: Mood normal.        Behavior: Behavior normal.       Results for orders placed or performed in visit on 08/31/21  POCT Urinalysis Dipstick (Automated)  Result Value Ref Range   Color, UA yellow    Clarity, UA clear    Glucose, UA Negative Negative   Bilirubin, UA negative    Ketones, UA +/-    Spec Grav, UA 1.025 1.010 - 1.025   Blood, UA 2+    pH, UA 6.0 5.0 - 8.0   Protein, UA Positive (A) Negative   Urobilinogen, UA 1.0 0.2 or 1.0 E.U./dL   Nitrite, UA negative    Leukocytes, UA Negative Negative   *Note: Due to a large number of results and/or encounters for the requested time period, some results have not been displayed. A complete set of results can be found in Results Review.    Lab Results  Component Value Date   CREATININE 1.48 05/31/2021    BUN 22 05/31/2021   NA 138 05/31/2021   K 4.2 05/31/2021   CL 99 05/31/2021   CO2 32 05/31/2021   Lab Results  Component Value Date   WBC 12.2 (H) 05/24/2020   HGB 14.8 05/24/2020   HCT 44.0 05/24/2020   MCV 84.2 05/24/2020   PLT 121.0 (L) 05/24/2020    DG Hip Unilat W OR W/O Pelvis 2-3 Views Right CLINICAL DATA:  Right hip pain after fall.  EXAM: DG HIP (WITH OR WITHOUT PELVIS) 2-3V RIGHT  COMPARISON:  None Available.  FINDINGS: There is no evidence of hip fracture or dislocation. No significant joint space narrowing is noted. Mild osteophyte formation is seen involving the right hip.  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 No acute abnormality seen.  Electronically Signed   By: Marijo Conception M.D.   On: 08/31/2021 09:26   Assessment & Plan:   Problem List Items Addressed This Visit     Hypothyroidism following radioiodine therapy   Microscopic hematuria    Microhematuria of unclear cause, in h/o kidney stones, but this does not feel like prior kidney stones.  In h/o AMS, culture urine.  He is on aspirin $RemoveBe'81mg'bpBPtepHG$  daily.       Relevant Orders   Urine Culture   Acute right hip pain - Primary    R lateral hip pain after fall  at home 6d ago. Not consistent with hip fracture, however will check hip films --> concern for R ischial fracture given asymmetry noted, will await radiology eval, consider ortho eval.  Supportive measures at this time reviewed including pain control at home (takes tylenol and tramadol).  Xray read as no fracture, I confirmed with radiologist. If ongoing pain, consider hip CT for further evaluation.       Relevant Orders   DG Hip Unilat W OR W/O Pelvis 2-3 Views Right (Completed)   Other Visit Diagnoses     Altered mental status, unspecified altered mental status type       Relevant Orders   POCT Urinalysis Dipstick (Automated) (Completed)   Uncontrolled type 2 diabetes mellitus with hyperglycemia, with long-term  current use of insulin (HCC)            No orders of the defined types were placed in this encounter.  Orders Placed This Encounter  Procedures   Urine Culture   DG Hip Unilat W OR W/O Pelvis 2-3 Views Right    Standing Status:   Future    Number of Occurrences:   1    Standing Expiration Date:   09/01/2022    Order Specific Question:   Reason for Exam (SYMPTOM  OR DIAGNOSIS REQUIRED)    Answer:   R hip/groin pain after fall    Order Specific Question:   Preferred imaging location?    Answer:   [facility]   POCT Urinalysis Dipstick (Automated)     Patient Instructions  Hip xray today - possible pelvic fracture, will await radiology evaluation.  May use heating pad to tender area, tylenol for discomfort. Let us know if persistent pain despite tylenol.  Urine showing some blood - we will send urine culture for further evaluation and be in touch with results.   Follow up plan: Return if symptoms worsen or fail to improve.  Ria Bush, MD

## 2021-08-31 NOTE — Telephone Encounter (Signed)
Okay with me if okay with lab.  Thanks.

## 2021-08-31 NOTE — Assessment & Plan Note (Addendum)
Microhematuria of unclear cause, in h/o kidney stones, but this does not feel like prior kidney stones.  In h/o AMS, culture urine.  He is on aspirin '81mg'$  daily.

## 2021-08-31 NOTE — Patient Instructions (Addendum)
Hip xray today - possible pelvic fracture, will await radiology evaluation.  May use heating pad to tender area, tylenol for discomfort. Let us know if persistent pain despite tylenol.  Urine showing some blood - we will send urine culture for further evaluation and be in touch with results.

## 2021-08-31 NOTE — Telephone Encounter (Signed)
Looks like this was already done this morning when patient was in to see Dr. Darnell Level.

## 2021-09-01 LAB — URINE CULTURE
MICRO NUMBER:: 13820653
Result:: NO GROWTH
SPECIMEN QUALITY:: ADEQUATE

## 2021-09-04 ENCOUNTER — Other Ambulatory Visit: Payer: Self-pay | Admitting: Family Medicine

## 2021-09-04 DIAGNOSIS — R3129 Other microscopic hematuria: Secondary | ICD-10-CM

## 2021-09-05 ENCOUNTER — Ambulatory Visit (INDEPENDENT_AMBULATORY_CARE_PROVIDER_SITE_OTHER): Payer: Medicare Other | Admitting: Endocrinology

## 2021-09-05 ENCOUNTER — Encounter: Payer: Self-pay | Admitting: Endocrinology

## 2021-09-05 VITALS — BP 118/68 | HR 74 | Ht 68.0 in | Wt 262.2 lb

## 2021-09-05 DIAGNOSIS — E890 Postprocedural hypothyroidism: Secondary | ICD-10-CM | POA: Diagnosis not present

## 2021-09-05 DIAGNOSIS — I255 Ischemic cardiomyopathy: Secondary | ICD-10-CM

## 2021-09-05 DIAGNOSIS — N1832 Chronic kidney disease, stage 3b: Secondary | ICD-10-CM

## 2021-09-05 DIAGNOSIS — E1165 Type 2 diabetes mellitus with hyperglycemia: Secondary | ICD-10-CM

## 2021-09-05 DIAGNOSIS — Z794 Long term (current) use of insulin: Secondary | ICD-10-CM | POA: Diagnosis not present

## 2021-09-05 NOTE — Progress Notes (Unsigned)
Patient ID: Henry [name], male   DOB: 06-23-1950, 71 y.o.   MRN: [id number]           Reason for Appointment: Follow-up for Type 2 Diabetes   History of Present Illness:          Diagnosis: Type 2 diabetes mellitus, date of diagnosis: 2000       Recent history:    INSULIN regimen: Omnipod insulin pump regional with Humalog insulin   Basal rate programs: Midnight = 1.5, 7 AM = 1.9, 11 a.m. = 2.5, total 51 units  Boluses 3-5 units for breakfast, 4-6 lunch and 6-7 units for dinner, 2-3 units for snacks Sensitivity 1: 60 carb ratio 1:10, target 120  Non-insulin hypoglycemic drugs the patient is taking are: Ozempic 1 mg weekly  His A1c is slightly higher at 6.8   Current management, blood sugar patterns and problems identified: He has not been seen in follow-up since 1/23  He was started on the Dexcom and OmniPod 5 system earlier this year but did not follow-up and was not educated by our nurse educator  Apparently did not have any problems using this system except because of insurance denial and need for follow-up he has not used the Dexcom since 8/5  He is using an OTC monitor with fingersticks that cannot be downloaded  Checking blood sugar mostly at the mealtime and most of the blood sugars are reasonably good with overall average about 140 in the last few days As usual his wife is helping him manage his pump She is generally estimating how much carbohydrate he is getting and entering this at the time of the bolus Usually getting 45-65 g of carbohydrate for his meals, higher intake at dinnertime Again getting 3 meals a day and only occasionally snacks As before he is trying to bolus at the time of his meals before starting to eat Has been unable to exercise as before Again has difficulty losing weight despite taking Ozempic regularly He is on 1 mg for some time but he now reports that he is stays nauseated although this is tolerable   He has seen the dietitian in  04/2017  Not able to exercise because of back pain and difficulty walking       Side effects from medications have been: None Compliance with the medical regimen: Fair   Blood Glucose readings by download of pump     PRE-MEAL Fasting Lunch Dinner Bedtime Overall  Glucose range: 98-143  132-171    Mean/median:     141   POST-MEAL PC Breakfast PC Lunch PC Dinner  Glucose range:   ?  Mean/median:      Previously  PRE-MEAL Fasting Lunch Dinner Bedtime Overall  Glucose range: 90-158  97-161  90-202  Mean/median: 120    123   POST-MEAL PC Breakfast PC Lunch PC Dinner  Glucose range: 143-202    Mean/median:      Meals: 9 am , 2-3 pm and 6-7 pm            Dietician visit, most recent: 4/19               Weight history:   Wt Readings from Last 3 Encounters:  09/05/21 262 lb 3.2 oz (118.9 kg)  08/31/21 260 lb 6 oz (118.1 kg)  06/13/21 263 lb (119.3 kg)    Glycemic control:   Lab Results  Component Value Date   HGBA1C 6.8 (H) 08/31/2021   HGBA1C 6.6 (H) 05/31/2021  HGBA1C 6.5 02/04/2021   Lab Results  Component Value Date   MICROALBUR <0.7 02/04/2021   LDLCALC 39 05/31/2021   CREATININE 1.46 08/31/2021    Lab Results  Component Value Date   FRUCTOSAMINE 359 (H) 07/01/2019   FRUCTOSAMINE 277 11/09/2017   FRUCTOSAMINE 350 (H) 03/29/2017    Past history:   He has been taking Amaryl for several years and probably metformin since onset also At some point he was changed from metformin to [place] and was taking this since at least 2012 A1c had been higher in 2015 Since his A1c had been progressively higher with his regimen of Janumet and Amaryl he was started on Victoza in 01/2014 He was started on Lantus insulin in 5/16 because of persistent hyperglycemia especially fasting; was having readings as high as 293 Because of tendency to high postprandial readings and for control with Lantus he was switched to the V-go pump in  09/2014   Other active problems: See  review of systems    Allergies as of 09/05/2021       Reactions   Penicillins Anaphylaxis, Rash   Because of a history of documented adverse serious drug reaction;Medi Alert bracelet  is recommended PATIENT HAS HAD A PCN REACTION WITH IMMEDIATE RASH, FACIAL/TONGUE/THROAT SWELLING, SOB, OR LIGHTHEADEDNESS WITH HYPOTENSION:  #  #  YES  #  #  Has patient had a PCN reaction causing severe rash involving mucus membranes or skin necrosis: unknown Has patient had a PCN reaction that required hospitalization NO Has patient had a PCN reaction occurring within the last 10 years: NO   Klonopin [clonazepam] Other (See Comments)   agitation   Peanut-containing Drug Products Cough   Watermelon [citrullus Vulgaris] Other (See Comments), Cough   Tickle in throat        Medication List        Accurate as of September 05, 2021  2:16 PM. If you have any questions, ask your nurse or doctor.          acetaminophen 500 MG tablet Commonly known as: TYLENOL Take 1,000 mg by mouth at bedtime as needed for mild pain or headache.   albuterol 108 (90 Base) MCG/ACT inhaler Commonly known as: VENTOLIN HFA Inhale 2 puffs into the lungs every 6 (six) hours as needed for wheezing or shortness of breath.   allopurinol 100 MG tablet Commonly known as: ZYLOPRIM TAKE 1 & 1/2 TABLETS BY MOUTH DAILY   aspirin EC 81 MG tablet Take 1 tablet (81 mg total) by mouth daily. Swallow whole.   Carbidopa-Levodopa ER 25-100 MG tablet controlled release Commonly known as: SINEMET CR TAKE 2 TABLETS BY MOUTH 5 TIMES PER DAY AS DIRECTED   carbidopa-levodopa 50-200 MG tablet Commonly known as: SINEMET CR TAKE 1 TABLET BY MOUTH EVERYDAY AT BEDTIME   colchicine 0.6 MG tablet Take 1 tablet (0.6 mg total) by mouth daily as needed (gout).   cyanocobalamin 1000 MCG tablet Commonly known as: VITAMIN B12 Take 1,000 mcg by mouth daily.   Dexcom G6 Receiver Devi Use to check blood sugar daily   Dexcom G6 Sensor  Misc Use to check blood sugar daily   Dexcom G6 Transmitter Misc Change every 3 months   dorzolamide-timolol 22.3-6.8 MG/ML ophthalmic solution Commonly known as: COSOPT Place 1 drop into the right eye 2 (two) times daily.   DULoxetine 30 MG capsule Commonly known as: CYMBALTA TAKE 1 CAPSULE BY MOUTH EVERY DAY   fluticasone-salmeterol 250-50 MCG/ACT Aepb Commonly known as: Advair Diskus  Inhale 1 puff into the lungs in the morning and at bedtime. Rinse after use.   FREESTYLE LITE test strip Generic drug: glucose blood USE TO CHECK BLOOD SUGAR 4 TIMES DAILY.   furosemide 80 MG tablet Commonly known as: LASIX TAKE 1 TABLET BY MOUTH EVERY DAY   HumaLOG 100 UNIT/ML injection Generic drug: insulin lispro USE MAXIMUM 76 UNITS PER   DAY WITH V-GO PUMP   Klor-Con M20 20 MEQ tablet Generic drug: potassium chloride SA TAKE 3 TABLETS (60 MEQ) BY MOUTH DAILY.   levothyroxine 137 MCG tablet Commonly known as: SYNTHROID TAKE 1 TABLET BY MOUTH EVERY DAY BEFORE BREAKFAST   losartan 25 MG tablet Commonly known as: COZAAR Take 1 tablet (25 mg total) by mouth daily.   metoprolol succinate 50 MG 24 hr tablet Commonly known as: TOPROL-XL TAKE 1 TABLET BY MOUTH EVERY DAY WITH OR IMMEDIATELY FOLLOWING A MEAL   Myrbetriq 25 MG Tb24 tablet Generic drug: mirabegron ER TAKE 1 TABLET BY MOUTH EVERY DAY   nitroGLYCERIN 0.4 MG SL tablet Commonly known as: NITROSTAT Place 1 tablet (0.4 mg total) under the tongue every 5 (five) minutes as needed for chest pain.   omega-3 acid ethyl esters 1 g capsule Commonly known as: LOVAZA TAKE 2 CAPSULES BY MOUTH TWICE A DAY   Omnipod 5 G6 Intro (Gen 5) Kit 1 kit by Does not apply route daily.   Omnipod 5 G6 Pod (Gen 5) Misc CHANGE POD EVERY 3 DAYS   OVER THE COUNTER MEDICATION Apply 1 application topically daily as needed (pain). Theraworx Pain Cream   Ozempic (1 MG/DOSE) 4 MG/3ML Sopn Generic drug: Semaglutide (1 MG/DOSE) INJECT $RemoveBeforeD'1MG'[id number]$   SUBCUTANEOUSLY  ONCE A WEEK.   polyethylene glycol 17 g packet Commonly known as: MIRALAX / GLYCOLAX Take 8.5 g by mouth daily.   rosuvastatin 20 MG tablet Commonly known as: CRESTOR TAKE 1 TABLET BY MOUTH EVERYDAY AT BEDTIME   traMADol 50 MG tablet Commonly known as: ULTRAM TAKE 1 TABLET BY MOUTH TWICE A DAY   Vitamin D 50 MCG (2000 UT) tablet Take 2,000 Units by mouth daily.        Allergies:  Allergies  Allergen Reactions   Penicillins Anaphylaxis and Rash    Because of a history of documented adverse serious drug reaction;Medi Alert bracelet  is recommended PATIENT HAS HAD A PCN REACTION WITH IMMEDIATE RASH, FACIAL/TONGUE/THROAT SWELLING, SOB, OR LIGHTHEADEDNESS WITH HYPOTENSION:  #  #  YES  #  #  Has patient had a PCN reaction causing severe rash involving mucus membranes or skin necrosis: unknown Has patient had a PCN reaction that required hospitalization NO Has patient had a PCN reaction occurring within the last 10 years: NO   Klonopin [Clonazepam] Other (See Comments)    agitation   Peanut-Containing Drug Products Cough   Watermelon [Citrullus Vulgaris] Other (See Comments) and Cough    Tickle in throat    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)   DM (diabetes mellitus) ([place])    TYPE 2 - insulin  pump   Dysrhythmia    a-fib   GERD (gastroesophageal reflux disease)    Glaucoma    right eye   History of kidney stones    multiple   HLD (hyperlipidemia)    HTN (hypertension)    pt denies 08/19/12   Hyperplasia, prostate    Hypothyroidism    MI (myocardial infarction) ([place])    Dr Crenshaw 2000, x3vessels bypass    OSA (obstructive sleep apnea)    AHI-28,on CPAP, noncompliant with CPAP   Parkinson disease ([place])    1999   PONV (postoperative nausea and vomiting)    Restless legs    Shortness of breath    Hx: of at all times   UTI (lower urinary tract infection) 09/15/12   Klebsiella   Ventral hernia    Walker as ambulation aid    also uses wheelchair at home/when going out    Past Surgical History:  Procedure Laterality Date   [place]   right total loss   BIOPSY  10/14/2018   Procedure: BIOPSY;  Surgeon: Jerene Bears, MD;  Location: [facility];  Service: Gastroenterology;;   CARDIAC CATHETERIZATION     CATARACT EXTRACTION W/ INTRAOCULAR LENS IMPLANT     Hx: of right eye   CATARACT EXTRACTION W/ INTRAOCULAR LENS IMPLANT Left 2018   COLONOSCOPY N/A 10/13/2014   Procedure: COLONOSCOPY;  Surgeon: Jerene Bears, MD;  Location: [facility];  Service: Gastroenterology;  Laterality: N/A;   COLONOSCOPY W/ BIOPSIES AND POLYPECTOMY     Hx: of   COLONOSCOPY WITH PROPOFOL N/A 10/14/2018   Procedure: COLONOSCOPY WITH PROPOFOL;  Surgeon: Jerene Bears, MD;  Location: [facility];  Service: Gastroenterology;  Laterality: N/A;   CORONARY ARTERY BYPASS GRAFT  2000   [name], MD   CORONARY STENT PLACEMENT  1998   DEEP BRAIN STIMULATOR PLACEMENT  2004   Right and left VIN stimulator placement (parkinsons)   EYE SURGERY     FINGER AMPUTATION     left pointer   IMPLANTABLE CARDIOVERTER DEFIBRILLATOR IMPLANT N/A 11/13/2013   Procedure: IMPLANTABLE CARDIOVERTER DEFIBRILLATOR IMPLANT;  Surgeon: Evans Lance, MD;  Location: [facility] CATH LAB;  Service: Cardiovascular;  Laterality: N/A;   INSERT / REPLACE / REMOVE PACEMAKER     medtronic   LEFT AND RIGHT HEART CATHETERIZATION WITH CORONARY ANGIOGRAM N/A 09/24/2013   Procedure: LEFT AND RIGHT HEART CATHETERIZATION WITH CORONARY ANGIOGRAM;  Surgeon: Burnell Blanks, MD;  Location: [facility] CATH LAB;  Service: Cardiovascular;   Laterality: N/A;   LITHOTRIPSY     3 different times   MEDIAN STERNOTOMY  2000   POLYPECTOMY  10/14/2018   Procedure: POLYPECTOMY;  Surgeon: Jerene Bears, MD;  Location: [facility];  Service: Gastroenterology;;   PULSE GENERATOR IMPLANT Right 11/13/2017   Procedure: Right chest implantable pulse generator change;  Surgeon: Erline Levine, MD;  Location: [facility];  Service: Neurosurgery;  Laterality: Right;  Right chest implantable pulse generator change   SUBTHALAMIC STIMULATOR BATTERY REPLACEMENT N/A 09/05/2012   Procedure: Deep brain stimulator battery change;  Surgeon: Erline Levine, MD;  Location: [place] [facility]URO [facility]S;  Service: Neurosurgery;  Laterality: N/A;  Deep brain stimulator battery change   SUBTHALAMIC STIMULATOR BATTERY REPLACEMENT N/A 06/10/2015   Procedure: Deep Brain stimulator battery change;  Surgeon: Erline Levine, MD;  Location: [place] NEURO ORS;  Service: Neurosurgery;  Laterality: N/A;   SUBTHALAMIC STIMULATOR BATTERY REPLACEMENT Right 09/25/2019   Procedure: Deep brain stimulator battery change;  Surgeon: Vertell Limber,  Broadus John, MD;  Location: [facility] [place];  Service: Neurosurgery;  Laterality: Right;   TONSILLECTOMY      Family History  Problem Relation Age of Onset   Aneurysm Mother    Alcoholism Father    HIV/AIDS Brother 74       AIDS   Healthy Sister    Healthy Child    Peripheral vascular disease Other    Arthritis Other    Healthy Sister    Healthy Child    Healthy Child    Diabetes Neg Hx    Heart disease Neg Hx    Colon cancer Neg Hx    Prostate cancer Neg Hx     Social History:  reports that he has never smoked. He has never been exposed to tobacco smoke. He has never used smokeless tobacco. He reports that he does not currently use alcohol. He reports that he does not use drugs.    Review of Systems     HYPERLIPIDEMIA:  He is on Crestor 20 mg with good control of LDL, has low HDL Triglycerides are last at 150, previously over 200, treated with Lovaza 2 g twice a day HDL has  improved      Lab Results  Component Value Date   CHOL 103 05/31/2021   CHOL 110 06/24/2020   CHOL 130 04/24/2019   Lab Results  Component Value Date   HDL 29.10 (L) 05/31/2021   HDL 32.30 (L) 06/24/2020   HDL 23.20 (L) 04/24/2019   Lab Results  Component Value Date   LDLCALC 39 05/31/2021   LDLCALC 47 06/24/2020   LDLCALC 118 (H) 09/16/2015   Lab Results  Component Value Date   TRIG 172.0 (H) 05/31/2021   TRIG 150.0 (H) 06/24/2020   TRIG 297.0 (H) 04/24/2019   Lab Results  Component Value Date   CHOLHDL 4 05/31/2021   CHOLHDL 3 06/24/2020   CHOLHDL 6 04/24/2019   Lab Results  Component Value Date   LDLDIRECT 51.0 04/24/2019   LDLDIRECT 54.0 09/18/2018   LDLDIRECT 67.0 03/12/2018              Lab Results  Component Value Date   ALT 4 05/31/2021        HYPOTHYROIDISM:   He has had hypothyroidism following radioactive iodine treatment for hyperthyroidism in 2013  Last TSH has been normal with taking 137 mcg levothyroxine, 7.5 tablets a week   Lab Results  Component Value Date   TSH 5.58 (H) 08/31/2021   TSH 2.21 05/31/2021   TSH 3.09 06/24/2020   FREET4 0.77 04/24/2019   FREET4 0.88 08/16/2018   FREET4 0.75 03/12/2018   Has chronic systolic CHF Prescribed 80 mg Lasix daily without metolazone  Blood pressure history:  BP Readings from Last 3 Encounters:  09/05/21 118/68  08/31/21 124/70  06/13/21 122/70    CKD: Has variable creatinine levels as follows: Has been followed by nephrologist, Wilder Glade was stopped when he had UTI in 5/22 No   microalbuminuria   He was prescribed LOSARTAN 25 mg by his cardiologist but he thinks he had abdominal pain for a week with this and has not taken it  Lab Results  Component Value Date   CREATININE 1.46 08/31/2021   CREATININE 1.48 05/31/2021   CREATININE 1.21 03/14/2021    Neuropathy: Taking gabapentin bid from his PCP. Has history of numbness in his toes  Diabetic foot exam in 9/22   Physical  Examination:  BP 118/68   Pulse 74   Ht  $'5\' 8"'j$  (1.727 m)   Wt 262 lb 3.2 oz (118.9 kg)   SpO2 98%   BMI 39.87 kg/m      ASSESSMENT/PLAN  Diabetes type 2, insulin-dependent with obesity     See history of present illness for detailed discussion of his current management, blood sugar patterns and problems identified  His A1c is consistently near the normal range and now 6.8  He is using the Omni pod 5 insulin pump also Ozempic 1 mg weekly  Blood sugars did to assess as he has not had a sensor on for 3 weeks However if you still have a little higher than expected blood sugar control with the automated system  He has not gained weight but also not losing weight with Ozempic which he thinks is making him a little nauseated now      HYPOTHYROIDISM: TSH slightly above normal, will have him take an extra pill weekly instead of one half on Saturday   Mixed Hyperlipidemia: Will need follow-up labs on the next visit  RENAL dysfunction:  stable and only mildly abnormal     There are no Patient Instructions on file for this visit.   Follow-up in 4 months    Elayne Snare 09/05/2021, 2:16 PM   Note: This office note was prepared with Dragon voice recognition system technology. Any transcriptional errors that result from this process are unintentional.

## 2021-09-05 NOTE — Patient Instructions (Signed)
Take extra thyroid pill on Sat.   Mounjaro 2.'5mg'$  for 4 weeks then 5 mg weekly, call for rx, after Ozempic out

## 2021-09-08 DIAGNOSIS — H35341 Macular cyst, hole, or pseudohole, right eye: Secondary | ICD-10-CM | POA: Diagnosis not present

## 2021-09-08 DIAGNOSIS — H4031X3 Glaucoma secondary to eye trauma, right eye, severe stage: Secondary | ICD-10-CM | POA: Diagnosis not present

## 2021-09-08 DIAGNOSIS — H40032 Anatomical narrow angle, left eye: Secondary | ICD-10-CM | POA: Diagnosis not present

## 2021-09-13 ENCOUNTER — Encounter: Payer: Self-pay | Admitting: Endocrinology

## 2021-09-20 ENCOUNTER — Encounter: Payer: Self-pay | Admitting: Family Medicine

## 2021-09-20 ENCOUNTER — Ambulatory Visit (INDEPENDENT_AMBULATORY_CARE_PROVIDER_SITE_OTHER): Payer: Medicare Other | Admitting: Family Medicine

## 2021-09-20 VITALS — BP 122/68 | HR 70 | Temp 98.0°F | Ht 68.0 in | Wt 264.5 lb

## 2021-09-20 DIAGNOSIS — I255 Ischemic cardiomyopathy: Secondary | ICD-10-CM

## 2021-09-20 DIAGNOSIS — R3129 Other microscopic hematuria: Secondary | ICD-10-CM | POA: Diagnosis not present

## 2021-09-20 DIAGNOSIS — L989 Disorder of the skin and subcutaneous tissue, unspecified: Secondary | ICD-10-CM

## 2021-09-20 LAB — POC URINALSYSI DIPSTICK (AUTOMATED)
Bilirubin, UA: NEGATIVE
Glucose, UA: NEGATIVE
Ketones, UA: NEGATIVE
Leukocytes, UA: NEGATIVE
Nitrite, UA: NEGATIVE
Protein, UA: NEGATIVE
Spec Grav, UA: 1.015 (ref 1.010–1.025)
Urobilinogen, UA: 0.2 E.U./dL
pH, UA: 6 (ref 5.0–8.0)

## 2021-09-20 LAB — URINALYSIS, ROUTINE W REFLEX MICROSCOPIC
Bilirubin Urine: NEGATIVE
Ketones, ur: NEGATIVE
Leukocytes,Ua: NEGATIVE
Nitrite: NEGATIVE
Specific Gravity, Urine: 1.01 (ref 1.000–1.030)
Total Protein, Urine: NEGATIVE
Urine Glucose: NEGATIVE
Urobilinogen, UA: 0.2 (ref 0.0–1.0)
pH: 5.5 (ref 5.0–8.0)

## 2021-09-20 MED ORDER — CLOTRIMAZOLE 1 % EX CREA: 1.0000 | TOPICAL_CREAM | Freq: Two times a day (BID) | CUTANEOUS | 0 refills | Status: DC

## 2021-09-20 NOTE — Progress Notes (Signed)
Patient ID: ADRIN [name], male    DOB: 10/27/1950, 71 y.o.   MRN: [id number]  This visit was conducted in person.  BP 122/68   Pulse 70   Temp 98 F (36.7 C) (Temporal)   Ht _0  (1.727 m)   Wt 264 lb 8 oz (120 kg)   SpO2 98%   BMI 40.22 kg/m    CC: check sores on buttock  Subjective:   HPI: Henry [name] is a 71 y.o. male presenting on 09/20/2021 for Blister (C/o bed sores on buttocks.  Started about 1 mo ago.  Pt accompanied by wife, Henry [name]. )   Pt of Henry [name] with h/o AICD, asthma, CAD, DM, HTN, HLD, hypothyroidism, OSA, gout, and parkinson's disease s/p DBS placement (Tat).   1 month h/o sores to buttock. Wife hasn't noted open sore. He does have rough skin to area. More painful when in bed. He sits on donut with benefit. No fevers/chills, streaking redness or draining pus, abd pain, blood in stool.   Due for f/u urinalysis for recently noted microhematuria      Relevant past medical, surgical, family and social history reviewed and updated as indicated. Interim medical history since our last visit reviewed. Allergies and medications reviewed and updated. Outpatient Medications Prior to Visit  Medication Sig Dispense Refill  . acetaminophen (TYLENOL) 500 MG tablet Take 1,000 mg by mouth at bedtime as needed for mild pain or headache.     . albuterol (VENTOLIN HFA) 108 (90 Base) MCG/ACT inhaler Inhale 2 puffs into the lungs every 6 (six) hours as needed for wheezing or shortness of breath. 18 g 5  . allopurinol (ZYLOPRIM) 100 MG tablet TAKE 1 & 1/2 TABLETS BY MOUTH DAILY 135 tablet 3  . aspirin EC 81 MG tablet Take 1 tablet (81 mg total) by mouth daily. Swallow whole. 90 tablet 3  . carbidopa-levodopa (SINEMET CR) 50-200 MG tablet TAKE 1 TABLET BY MOUTH EVERYDAY AT BEDTIME 90 tablet 0  . Carbidopa-Levodopa ER (SINEMET CR) 25-100 MG tablet controlled release TAKE 2 TABLETS BY MOUTH 5 TIMES PER DAY AS DIRECTED 900 tablet 1  . Cholecalciferol (VITAMIN D) 50 MCG  (2000 UT) tablet Take 2,000 Units by mouth daily.     . colchicine 0.6 MG tablet Take 1 tablet (0.6 mg total) by mouth daily as needed (gout).    . Continuous Blood Gluc Receiver (DEXCOM G6 RECEIVER) DEVI Use to check blood sugar daily 1 each 0  . Continuous Blood Gluc Sensor (DEXCOM G6 SENSOR) MISC Use to check blood sugar daily 9 each 3  . Continuous Blood Gluc Transmit (DEXCOM G6 TRANSMITTER) MISC Change every 3 months 1 each 3  . dorzolamide-timolol (COSOPT) 22.3-6.8 MG/ML ophthalmic solution Place 1 drop into the right eye 2 (two) times daily.    . DULoxetine (CYMBALTA) 30 MG capsule TAKE 1 CAPSULE BY MOUTH EVERY DAY 90 capsule 1  . fluticasone-salmeterol (ADVAIR DISKUS) 250-50 MCG/ACT AEPB Inhale 1 puff into the lungs in the morning and at bedtime. Rinse after use. 180 each 3  . FREESTYLE LITE test strip USE TO CHECK BLOOD SUGAR 4 TIMES DAILY. 150 strip 3  . furosemide (LASIX) 80 MG tablet TAKE 1 TABLET BY MOUTH EVERY DAY 90 tablet 1  . HUMALOG 100 UNIT/ML injection USE MAXIMUM 76 UNITS PER   DAY WITH V-GO PUMP 60 mL 3  . Insulin Disposable Pump (OMNIPOD 5 G6 INTRO, GEN 5,) KIT 1 kit by Does not apply route daily.  1 kit 0  . Insulin Disposable Pump (OMNIPOD 5 G6 POD, GEN 5,) MISC CHANGE POD EVERY 3 DAYS 10 each 3  . KLOR-CON M20 20 MEQ tablet TAKE 3 TABLETS (60 MEQ) BY MOUTH DAILY. 270 tablet 1  . levothyroxine (SYNTHROID) 137 MCG tablet TAKE 1 TABLET BY MOUTH EVERY DAY BEFORE BREAKFAST (Patient taking differently: Takes 1 extra tablet one day a week) 90 tablet 1  . metoprolol succinate (TOPROL-XL) 50 MG 24 hr tablet TAKE 1 TABLET BY MOUTH EVERY DAY WITH OR IMMEDIATELY FOLLOWING A MEAL 90 tablet 1  . omega-3 acid ethyl esters (LOVAZA) 1 g capsule TAKE 2 CAPSULES BY MOUTH TWICE A DAY 360 capsule 1  . OVER THE COUNTER MEDICATION Apply 1 application topically daily as needed (pain). Theraworx Pain Cream    . OZEMPIC, 1 MG/DOSE, 4 MG/3ML SOPN INJECT 1MG SUBCUTANEOUSLY  ONCE A WEEK. 9 mL 3  .  polyethylene glycol (MIRALAX / GLYCOLAX) packet Take 8.5 g by mouth daily.    . rosuvastatin (CRESTOR) 20 MG tablet TAKE 1 TABLET BY MOUTH EVERYDAY AT BEDTIME 90 tablet 1  . traMADol (ULTRAM) 50 MG tablet TAKE 1 TABLET BY MOUTH TWICE A DAY 60 tablet 2  . vitamin B-12 (CYANOCOBALAMIN) 1000 MCG tablet Take 1,000 mcg by mouth daily.    Marland Kitchen losartan (COZAAR) 25 MG tablet Take 1 tablet (25 mg total) by mouth daily. 90 tablet 3  . nitroGLYCERIN (NITROSTAT) 0.4 MG SL tablet Place 1 tablet (0.4 mg total) under the tongue every 5 (five) minutes as needed for chest pain. 25 tablet 12  . MYRBETRIQ 25 MG TB24 tablet TAKE 1 TABLET BY MOUTH EVERY DAY (Patient not taking: Reported on 09/05/2021) 30 tablet 5   No facility-administered medications prior to visit.     Per HPI unless specifically indicated in ROS section below Review of Systems  Objective:  BP 122/68   Pulse 70   Temp 98 F (36.7 C) (Temporal)   Ht _0  (1.727 m)   Wt 264 lb 8 oz (120 kg)   SpO2 98%   BMI 40.22 kg/m   Wt Readings from Last 3 Encounters:  09/20/21 264 lb 8 oz (120 kg)  09/05/21 262 lb 3.2 oz (118.9 kg)  08/31/21 260 lb 6 oz (118.1 kg)      Physical Exam Vitals and nursing note reviewed.  Constitutional:      Appearance: Normal appearance. He is obese. He is not ill-appearing.     Comments: Ambulates with rolling walker, unsteady  Skin:    General: Skin is warm and dry.     Findings: Erythema and rash present.          Comments:  Thickened skin to sacral area with papules with keratin plug on right  Erythema without skin tear at midline sacrum  Neurological:     Mental Status: He is alert.  Psychiatric:        Mood and Affect: Mood normal.        Behavior: Behavior normal.      Results for orders placed or performed in visit on 09/20/21  POCT Urinalysis Dipstick (Automated)  Result Value Ref Range   Color, UA yellow    Clarity, UA clear    Glucose, UA Negative Negative   Bilirubin, UA negative     Ketones, UA negative    Spec Grav, UA 1.015 1.010 - 1.025   Blood, UA 1+    pH, UA 6.0 5.0 - 8.0   Protein,  UA Negative Negative   Urobilinogen, UA 0.2 0.2 or 1.0 E.U./dL   Nitrite, UA negative    Leukocytes, UA Negative Negative   *Note: Due to a large number of results and/or encounters for the requested time period, some results have not been displayed. A complete set of results can be found in Results Review.   Micro:  WBC 0  RBC 5-10  Bact tr  Casts none  Epi rare  UCx not sent  Assessment & Plan:   Problem List Items Addressed This Visit     Microscopic hematuria    Microhematuria persists with 5-10 RBC/hpf.  UCx negative last month.  No urinary symptoms including no flank pain. Will refer to urology.       Relevant Orders   POCT Urinalysis Dipstick (Automated) (Completed)   Ambulatory referral to Urology   Skin lesion - Primary    Thickened skin with overlying papules on right to sacral region, as well as erythema of midline lower sacrum into buttocks without open skin.  Recommend clotrimazole cream BID x 3-4 wks + desitin barrier cream. Recommend frequent position changes, continue using donut cushion for sitting. Update if ongoing or worsening symptoms. Not consistent with pressure sore at this time.  Could consider gel overlay for mattress if ongoing issue.  Touched base with PCP.         Meds ordered this encounter  Medications  . clotrimazole (LOTRIMIN) 1 % cream    Sig: Apply 1 Application topically 2 (two) times daily.    Dispense:  30 g    Refill:  0   Orders Placed This Encounter  Procedures  . Ambulatory referral to Urology    Referral Priority:   Routine    Referral Type:   Consultation    Referral Reason:   Specialty Services Required    Requested Specialty:   Urology    Number of Visits Requested:   1  . POCT Urinalysis Dipstick (Automated)     Patient Instructions  Urinalysis today  No obvious sores on bottom.  Use clotrimazole  cream (over the counter) twice daily for red areas on bottom for a few weeks. Use desitin barrier cream to bottom daily as well.  Let us know if worsening redness, worsening pain, or discharge/drainage.  Good to see you today   Follow up plan: Return if symptoms worsen or fail to improve.  Ria Bush, MD

## 2021-09-20 NOTE — Assessment & Plan Note (Addendum)
Thickened skin with overlying papules on right to sacral region, as well as erythema of midline lower sacrum into buttocks without open skin.  Recommend clotrimazole cream BID x 3-4 wks + desitin barrier cream. Recommend frequent position changes, continue using donut cushion for sitting. Update if ongoing or worsening symptoms. Not consistent with pressure sore at this time.  Could consider gel overlay for mattress if ongoing issue.  Touched base with PCP.

## 2021-09-20 NOTE — Assessment & Plan Note (Addendum)
Microhematuria persists with 5-10 RBC/hpf.  UCx negative last month.  No urinary symptoms including no flank pain. Will refer to urology.

## 2021-09-20 NOTE — Patient Instructions (Addendum)
Urinalysis today  No obvious sores on bottom.  Use clotrimazole cream (over the counter) twice daily for red areas on bottom for a few weeks. Use desitin barrier cream to bottom daily as well.  Let us know if worsening redness, worsening pain, or discharge/drainage.  Good to see you today

## 2021-09-22 ENCOUNTER — Other Ambulatory Visit: Payer: Medicare Other

## 2021-09-27 ENCOUNTER — Ambulatory Visit (INDEPENDENT_AMBULATORY_CARE_PROVIDER_SITE_OTHER): Payer: Medicare Other

## 2021-09-27 DIAGNOSIS — Z23 Encounter for immunization: Secondary | ICD-10-CM | POA: Diagnosis not present

## 2021-10-05 ENCOUNTER — Telehealth: Payer: Self-pay

## 2021-10-05 NOTE — Patient Outreach (Signed)
  Care Coordination   Initial Visit Note   10/05/2021 Name: RUMALDO DIFATTA MRN: [id number] DOB: 19-Oct-1950  AAKASH HOLLOMON is a 71 y.o. year old male who sees Tonia Ghent, MD for primary care. I spoke with  [name] and wife Vearl Allbaugh by phone today.  What matters to the patients health and wellness today?  Patient stats he does not have any nursing or community resource needs at this time.      Goals Addressed             This Visit's Progress    COMPLETED: Care coordination activities - no follow up needed       Care Coordination Interventions: Care coordination program/ services discussed  Social determinants of health survey completed Patient advised to contact primary care provider office or RNCM if care coordination services needed in the future. Contact phone number provided.            SDOH assessments and interventions completed:  Yes  SDOH Interventions Today    Flowsheet Row Most Recent Value  SDOH Interventions   Food Insecurity Interventions Intervention Not Indicated  Housing Interventions Intervention Not Indicated  Transportation Interventions Intervention Not Indicated        Care Coordination Interventions Activated:  Yes  Care Coordination Interventions:  Yes, provided   Follow up plan: No further intervention required.   Encounter Outcome:  Pt. Visit Completed   Quinn Plowman RN,BSN,CCM [place] [phone] direct line

## 2021-10-12 NOTE — Telephone Encounter (Signed)
Error

## 2021-10-13 ENCOUNTER — Other Ambulatory Visit: Payer: Self-pay | Admitting: Neurology

## 2021-10-13 DIAGNOSIS — G20A1 Parkinson's disease without dyskinesia, without mention of fluctuations: Secondary | ICD-10-CM

## 2021-10-15 ENCOUNTER — Other Ambulatory Visit: Payer: Self-pay | Admitting: Family Medicine

## 2021-10-17 ENCOUNTER — Encounter: Payer: Self-pay | Admitting: Endocrinology

## 2021-10-17 DIAGNOSIS — E1165 Type 2 diabetes mellitus with hyperglycemia: Secondary | ICD-10-CM

## 2021-10-17 NOTE — Telephone Encounter (Signed)
Refill request for TRAMADOL HCL 50 MG TABLET  LOV - 09/20/21 Next OV - not scheduled Last refill - 07/19/21 #60/2

## 2021-10-19 ENCOUNTER — Ambulatory Visit (INDEPENDENT_AMBULATORY_CARE_PROVIDER_SITE_OTHER): Payer: Medicare Other

## 2021-10-19 DIAGNOSIS — I255 Ischemic cardiomyopathy: Secondary | ICD-10-CM | POA: Diagnosis not present

## 2021-10-19 DIAGNOSIS — N1832 Chronic kidney disease, stage 3b: Secondary | ICD-10-CM | POA: Diagnosis not present

## 2021-10-19 LAB — CUP PACEART REMOTE DEVICE CHECK
Battery Remaining Longevity: 30 mo
Battery Voltage: 2.94 V
Brady Statistic AP VP Percent: 0.01 %
Brady Statistic AP VS Percent: 3.36 %
Brady Statistic AS VP Percent: 0.08 %
Brady Statistic AS VS Percent: 96.56 %
Brady Statistic RA Percent Paced: 3.34 %
Brady Statistic RV Percent Paced: 0.08 %
Date Time Interrogation Session: 20231011151155
HighPow Impedance: 67 Ohm
Implantable Lead Implant Date: 20151105
Implantable Lead Implant Date: 20151105
Implantable Lead Location: 753859
Implantable Lead Location: 753860
Implantable Lead Model: 5076
Implantable Pulse Generator Implant Date: 20151105
Lead Channel Impedance Value: 437 Ohm
Lead Channel Impedance Value: 437 Ohm
Lead Channel Impedance Value: 494 Ohm
Lead Channel Pacing Threshold Amplitude: 0.375 V
Lead Channel Pacing Threshold Amplitude: 1 V
Lead Channel Pacing Threshold Pulse Width: 0.4 ms
Lead Channel Pacing Threshold Pulse Width: 0.4 ms
Lead Channel Sensing Intrinsic Amplitude: 1.75 mV
Lead Channel Sensing Intrinsic Amplitude: 1.75 mV
Lead Channel Sensing Intrinsic Amplitude: 18.75 mV
Lead Channel Sensing Intrinsic Amplitude: 18.75 mV
Lead Channel Setting Pacing Amplitude: 2 V
Lead Channel Setting Pacing Amplitude: 2.5 V
Lead Channel Setting Pacing Pulse Width: 0.4 ms
Lead Channel Setting Sensing Sensitivity: 0.3 mV

## 2021-10-19 MED ORDER — TIRZEPATIDE 2.5 MG/0.5ML ~~LOC~~ SOAJ: 2.5000 mg | SUBCUTANEOUS | 0 refills | Status: DC

## 2021-10-28 DIAGNOSIS — I129 Hypertensive chronic kidney disease with stage 1 through stage 4 chronic kidney disease, or unspecified chronic kidney disease: Secondary | ICD-10-CM | POA: Diagnosis not present

## 2021-10-28 DIAGNOSIS — I509 Heart failure, unspecified: Secondary | ICD-10-CM | POA: Diagnosis not present

## 2021-10-28 DIAGNOSIS — D631 Anemia in chronic kidney disease: Secondary | ICD-10-CM | POA: Diagnosis not present

## 2021-10-28 DIAGNOSIS — G20A1 Parkinson's disease without dyskinesia, without mention of fluctuations: Secondary | ICD-10-CM | POA: Diagnosis not present

## 2021-10-28 DIAGNOSIS — E1122 Type 2 diabetes mellitus with diabetic chronic kidney disease: Secondary | ICD-10-CM | POA: Diagnosis not present

## 2021-10-28 DIAGNOSIS — N1832 Chronic kidney disease, stage 3b: Secondary | ICD-10-CM | POA: Diagnosis not present

## 2021-10-28 DIAGNOSIS — N2581 Secondary hyperparathyroidism of renal origin: Secondary | ICD-10-CM | POA: Diagnosis not present

## 2021-10-30 ENCOUNTER — Other Ambulatory Visit: Payer: Self-pay | Admitting: Cardiology

## 2021-10-30 ENCOUNTER — Other Ambulatory Visit: Payer: Self-pay | Admitting: Endocrinology

## 2021-10-30 ENCOUNTER — Other Ambulatory Visit: Payer: Self-pay | Admitting: Family Medicine

## 2021-10-30 DIAGNOSIS — I5022 Chronic systolic (congestive) heart failure: Secondary | ICD-10-CM

## 2021-10-30 DIAGNOSIS — I2510 Atherosclerotic heart disease of native coronary artery without angina pectoris: Secondary | ICD-10-CM

## 2021-10-31 ENCOUNTER — Other Ambulatory Visit: Payer: Self-pay | Admitting: Family Medicine

## 2021-11-01 ENCOUNTER — Ambulatory Visit
Admission: RE | Admit: 2021-11-01 | Discharge: 2021-11-01 | Disposition: A | Discharge status: Home / Self Care | Payer: Medicare Other | Source: Ambulatory Visit | Attending: Family Medicine | Admitting: Family Medicine

## 2021-11-01 ENCOUNTER — Ambulatory Visit (INDEPENDENT_AMBULATORY_CARE_PROVIDER_SITE_OTHER): Payer: Medicare Other | Admitting: Family Medicine

## 2021-11-01 ENCOUNTER — Encounter: Payer: Self-pay | Admitting: Family Medicine

## 2021-11-01 VITALS — BP 120/68 | HR 73 | Temp 98.0°F | Ht 68.0 in | Wt 266.0 lb

## 2021-11-01 DIAGNOSIS — R062 Wheezing: Secondary | ICD-10-CM

## 2021-11-01 DIAGNOSIS — E890 Postprocedural hypothyroidism: Secondary | ICD-10-CM | POA: Diagnosis not present

## 2021-11-01 DIAGNOSIS — J45909 Unspecified asthma, uncomplicated: Secondary | ICD-10-CM

## 2021-11-01 DIAGNOSIS — I255 Ischemic cardiomyopathy: Secondary | ICD-10-CM | POA: Diagnosis not present

## 2021-11-01 MED ORDER — ALBUTEROL SULFATE HFA 108 (90 BASE) MCG/ACT IN AERS: 2.0000 | INHALATION_SPRAY | Freq: Four times a day (QID) | RESPIRATORY_TRACT | 5 refills | Status: DC | PRN

## 2021-11-01 MED ORDER — LEVOTHYROXINE SODIUM 137 MCG PO TABS: ORAL_TABLET | ORAL | 1 refills | Status: DC

## 2021-11-01 MED ORDER — ALBUTEROL SULFATE (2.5 MG/3ML) 0.083% IN NEBU
2.5000 mg | INHALATION_SOLUTION | Freq: Once | RESPIRATORY_TRACT | Status: AC
  Administered 2021-11-01: 2.5 mg via RESPIRATORY_TRACT

## 2021-11-01 MED ORDER — FLUTICASONE-SALMETEROL 250-50 MCG/ACT IN AEPB: 1.0000 | INHALATION_SPRAY | Freq: Two times a day (BID) | RESPIRATORY_TRACT | 3 refills | Status: DC

## 2021-11-01 NOTE — Progress Notes (Signed)
Remote ICD transmission.   

## 2021-11-01 NOTE — Progress Notes (Unsigned)
Wheezing started recently.  Ran out of albuterol.  Still using wixela.  That usually helps, esp in the fall.  Usually only rarely needs SABA.  Recent use SABA helped.  No FCNAVD.  No sputum.    Meds, vitals, and allergies reviewed.   ROS: Per HPI unless specifically indicated in ROS section   Nad Ncat Neck supple, no LA Rrr Ctab but he had some dyspnea prior to SABA neb tx.   Abd soft, not ttp. Extremities well perfused.  SABA neb given at OV.   Patient rechecked.  Lungs are still CTAB after neb and he subjectively felt better.

## 2021-11-01 NOTE — Patient Instructions (Signed)
Go to the lab on the way out.   If you have mychart we'll likely use that to update you.    Keep using wixela twice a day and rinse after use.   Use albuterol if needed.   Update me if feeling worse or if needing albuterol frequently.  Take care.  Glad to see you.

## 2021-11-02 NOTE — Assessment & Plan Note (Signed)
At this point still okay for outpatient follow-up.  Discussed checking chest x-ray.  Prescription sent for albuterol.  Continue as needed use at home.  Continue Wixela.  Routine cautions given to patient.  He agrees with plan.

## 2021-11-14 ENCOUNTER — Encounter: Payer: Self-pay | Admitting: Family Medicine

## 2021-11-15 ENCOUNTER — Encounter: Payer: Self-pay | Admitting: Internal Medicine

## 2021-11-15 ENCOUNTER — Ambulatory Visit (INDEPENDENT_AMBULATORY_CARE_PROVIDER_SITE_OTHER): Payer: Medicare Other | Admitting: Internal Medicine

## 2021-11-15 VITALS — BP 112/70 | HR 74 | Temp 98.1°F | Ht 68.0 in | Wt 268.0 lb

## 2021-11-15 DIAGNOSIS — I5043 Acute on chronic combined systolic (congestive) and diastolic (congestive) heart failure: Secondary | ICD-10-CM

## 2021-11-15 DIAGNOSIS — I255 Ischemic cardiomyopathy: Secondary | ICD-10-CM | POA: Diagnosis not present

## 2021-11-15 NOTE — Patient Instructions (Addendum)
Please increase the furosemide to '80mg'$  twice a day---around 8AM and 1PM. Continue to check your weight daily. Set up with Dr Damita Dunnings in 1-2 weeks

## 2021-11-15 NOTE — Progress Notes (Signed)
Subjective:    Patient ID: Henry [name], male    DOB: 02-Nov-1950, 71 y.o.   MRN: [id number]  HPI Here due to persistence of wheezing With wife  Had been wheezing ---seen by Dr Damita Dunnings Had been using the albuterol more---seemed better at first Now worse again Woke himself up wheezing--about a week ago  Doesn't feel sick No cough --except after eating at times  [facility] for asthma Some SOB at times--at times just lying in bed. Feels better when sitting up Very SOB when bending to tie shoes Sleeps in recliner--head up somewhat No chest pain Does feel his heart going fast at times  Does take the furosemide daily Weight drifting up from last year--but not much in the past few months Is careful about salt and weighs daily  Current Outpatient Medications on File Prior to Visit  Medication Sig Dispense Refill   acetaminophen (TYLENOL) 500 MG tablet Take 1,000 mg by mouth at bedtime as needed for mild pain or headache.      albuterol (VENTOLIN HFA) 108 (90 Base) MCG/ACT inhaler Inhale 2 puffs into the lungs every 6 (six) hours as needed for wheezing or shortness of breath. 18 g 5   allopurinol (ZYLOPRIM) 100 MG tablet TAKE 1 & 1/2 TABLETS BY MOUTH DAILY 135 tablet 3   aspirin EC 81 MG tablet Take 1 tablet (81 mg total) by mouth daily. Swallow whole. 90 tablet 3   carbidopa-levodopa (SINEMET CR) 50-200 MG tablet TAKE 1 TABLET BY MOUTH EVERYDAY AT BEDTIME 90 tablet 0   Carbidopa-Levodopa ER (SINEMET CR) 25-100 MG tablet controlled release TAKE 2 TABLETS BY MOUTH 5 TIMES PER DAY AS DIRECTED 900 tablet 1   Cholecalciferol (VITAMIN D) 50 MCG (2000 UT) tablet Take 2,000 Units by mouth daily.      clotrimazole (LOTRIMIN) 1 % cream Apply 1 Application topically 2 (two) times daily. 30 g 0   colchicine 0.6 MG tablet Take 1 tablet (0.6 mg total) by mouth daily as needed (gout).     Continuous Blood Gluc Receiver (DEXCOM G6 RECEIVER) DEVI Use to check blood sugar daily 1 each 0   Continuous  Blood Gluc Sensor (DEXCOM G6 SENSOR) MISC Use to check blood sugar daily 9 each 3   Continuous Blood Gluc Transmit (DEXCOM G6 TRANSMITTER) MISC Change every 3 months 1 each 3   dorzolamide-timolol (COSOPT) 22.3-6.8 MG/ML ophthalmic solution Place 1 drop into the right eye 2 (two) times daily.     DULoxetine (CYMBALTA) 30 MG capsule TAKE 1 CAPSULE BY MOUTH EVERY DAY 90 capsule 1   fluticasone-salmeterol (ADVAIR) 250-50 MCG/ACT AEPB Inhale 1 puff into the lungs in the morning and at bedtime. Rinse after use. 180 each 3   FREESTYLE LITE test strip USE TO CHECK BLOOD SUGAR 4 TIMES DAILY. 150 strip 3   furosemide (LASIX) 80 MG tablet TAKE 1 TABLET BY MOUTH EVERY DAY 90 tablet 1   HUMALOG 100 UNIT/ML injection USE MAXIMUM 76 UNITS PER   DAY WITH V-GO PUMP 60 mL 3   Insulin Disposable Pump (OMNIPOD 5 G6 INTRO, GEN 5,) KIT 1 kit by Does not apply route daily. 1 kit 0   Insulin Disposable Pump (OMNIPOD 5 G6 POD, GEN 5,) MISC CHANGE POD EVERY 3 DAYS 10 each 3   KLOR-CON M20 20 MEQ tablet TAKE 3 TABLETS (60 MEQ) BY MOUTH DAILY. 270 tablet 1   levothyroxine (SYNTHROID) 137 MCG tablet Take 1 tab daily with 1 extra tablet one day a week 90  tablet 1   losartan (COZAAR) 25 MG tablet Take 1 tablet (25 mg total) by mouth daily. Please keep scheduled appointment 90 tablet 0   metoprolol succinate (TOPROL-XL) 50 MG 24 hr tablet TAKE 1 TABLET BY MOUTH EVERY DAY WITH OR IMMEDIATELY FOLLOWING A MEAL 90 tablet 1   omega-3 acid ethyl esters (LOVAZA) 1 g capsule TAKE 2 CAPSULES BY MOUTH TWICE A DAY 360 capsule 1   OVER THE COUNTER MEDICATION Apply 1 application topically daily as needed (pain). Theraworx Pain Cream     polyethylene glycol (MIRALAX / GLYCOLAX) packet Take 8.5 g by mouth daily.     rosuvastatin (CRESTOR) 20 MG tablet TAKE 1 TABLET BY MOUTH EVERYDAY AT BEDTIME 90 tablet 1   tirzepatide (MOUNJARO) 2.5 MG/0.5ML Pen Inject 2.5 mg into the skin once a week. 2 mL 0   traMADol (ULTRAM) 50 MG tablet TAKE 1 TABLET BY  MOUTH TWICE A DAY 60 tablet 2   vitamin B-12 (CYANOCOBALAMIN) 1000 MCG tablet Take 1,000 mcg by mouth daily.     nitroGLYCERIN (NITROSTAT) 0.4 MG SL tablet Place 1 tablet (0.4 mg total) under the tongue every 5 (five) minutes as needed for chest pain. 25 tablet 12   No current facility-administered medications on file prior to visit.    Allergies  Allergen Reactions   Penicillins Anaphylaxis and Rash    Because of a history of documented adverse serious drug reaction;Medi Alert bracelet  is recommended PATIENT HAS HAD A PCN REACTION WITH IMMEDIATE RASH, FACIAL/TONGUE/THROAT SWELLING, SOB, OR LIGHTHEADEDNESS WITH HYPOTENSION:  #  #  YES  #  #  Has patient had a PCN reaction causing severe rash involving mucus membranes or skin necrosis: unknown Has patient had a PCN reaction that required hospitalization NO Has patient had a PCN reaction occurring within the last 10 years: NO   Klonopin [Clonazepam] Other (See Comments)    agitation   Peanut-Containing Drug Products Cough   Watermelon [Citrullus Vulgaris] Other (See Comments) and Cough    Tickle in throat    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)   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   Dysrhythmia    a-fib   GERD (gastroesophageal reflux disease)    Glaucoma    right eye   History of kidney stones    multiple   HLD (hyperlipidemia)    HTN (hypertension)    pt denies 08/19/12   Hyperplasia, prostate    Hypothyroidism    MI (myocardial infarction) ([place])    Dr Stanford Breed 2000, x3vessels bypass   OSA (obstructive sleep apnea)    AHI-28,on CPAP, noncompliant with  CPAP   Parkinson disease    1999   PONV (postoperative nausea and vomiting)    Restless legs    Shortness of breath    Hx: of at all times   UTI (lower urinary tract infection) 09/15/12   Klebsiella   Ventral hernia    Walker as ambulation aid    also uses wheelchair at  home/when going out    Past Surgical History:  Procedure Laterality Date   [place]   right total loss   BIOPSY  10/14/2018   Procedure: BIOPSY;  Surgeon: Jerene Bears, MD;  Location: [facility];  Service: Gastroenterology;;   CARDIAC CATHETERIZATION     CATARACT EXTRACTION W/ INTRAOCULAR LENS IMPLANT     Hx: of right eye   CATARACT EXTRACTION W/ INTRAOCULAR LENS IMPLANT Left 2018   COLONOSCOPY N/A 10/13/2014   Procedure: COLONOSCOPY;  Surgeon: Jerene Bears, MD;  Location: [facility];  Service: Gastroenterology;  Laterality: N/A;   COLONOSCOPY W/ BIOPSIES AND POLYPECTOMY     Hx: of   COLONOSCOPY WITH PROPOFOL N/A 10/14/2018   Procedure: COLONOSCOPY WITH PROPOFOL;  Surgeon: Jerene Bears, MD;  Location: [facility];  Service: Gastroenterology;  Laterality: N/A;   CORONARY ARTERY BYPASS GRAFT  2000   [name], MD   CORONARY STENT PLACEMENT  1998   DEEP BRAIN STIMULATOR PLACEMENT  2004   Right and left VIN stimulator placement (parkinsons)   EYE SURGERY     FINGER AMPUTATION     left pointer   IMPLANTABLE CARDIOVERTER DEFIBRILLATOR IMPLANT N/A 11/13/2013   Procedure: IMPLANTABLE CARDIOVERTER DEFIBRILLATOR IMPLANT;  Surgeon: Evans Lance, MD;  Location[facility] C[facility] LAB;  Service: Cardiovascular;  Laterality: N/A;   INSERT / REPLACE / REMOVE PACEMAKER     medtronic   LEFT AND RIGHT HEART CATHETERIZATION WITH CORONARY ANGIOGRAM N/A 09/24/2013   Procedure: LEFT AND RIGHT HEART CATHETERIZATION WITH CORONARY ANGIOGRAM;  Surgeon: Burnell Blanks, MD;  Location: [facility] CATH LAB;  Service: Cardiovascular;  Laterality: N/A;   LITHOTRIPSY     3 different times   MEDIAN STERNOTOMY  2000    POLYPECTOMY  10/14/2018   Procedure: POLYPECTOMY;  Surgeon: Jerene Bears, MD;  Location: [facility];  Service: Gastroenterology;;   PULSE GENERATOR IMPLANT Right 11/13/2017   Procedure: Right chest implantable pulse generator change;  Surgeon: Erline Levine, MD;  Location: [place];  Service: Neurosurgery;  Laterality: Right;  Right chest implantable pulse generator change   SUBTHALAMIC STIMULATOR BATTERY REPLACEMENT N/A 09/05/2012   Procedure: Deep brain stimulator battery change;  Surgeon: Erline Levine, MD;  Location: [place] [facility];  Service: Neurosurgery;  Laterality: N/A;  Deep brain stimulator battery change   SUBTHALAMIC STIMULATOR BATTERY REPLACEMENT N/A 06/10/2015   Procedure: Deep Brain stimulator battery change;  Surgeon: Erline Levine, MD;  Location: [place] [facility];  Service: Neurosurgery;  Laterality: N/A;   SUBTHALAMIC STIMULATOR BATTERY REPLACEMENT Right 09/25/2019   Procedure: Deep brain stimulator battery change;  Surgeon: Erline Levine, MD;  Location: [place];  Service: Neurosurgery;  Laterality: Right;   TONSILLECTOMY      Family History  Problem Relation Age of Onset   Aneurysm Mother    Alcoholism Father    HIV/AIDS Brother 27       AIDS   Healthy Sister    Healthy Child    Peripheral vascular disease Other    Arthritis Other    Healthy Sister    Healthy Child    Healthy Child    Diabetes Neg Hx    Heart disease Neg Hx    Colon cancer Neg Hx    Prostate cancer Neg Hx     Social History   Socioeconomic History   Marital status: Married    Spouse name: [name]   Number of children: 2   Years of education: 12   Highest education  level: High school graduate  Occupational History   Occupation: DISABLED    Comment: [employer], [place]  Tobacco Use   Smoking status: Never    Passive exposure: Never   Smokeless tobacco: Never  Vaping Use   Vaping Use: Never used  Substance and Sexual Activity   Alcohol use: Not Currently   Drug use: No   Sexual activity: Not  Currently  Other Topics Concern   Not on file  Social History Narrative   From [facility]   Retired/disability Clinical research associate   Likes to fish.     Married 1972   3 kids   [place] fan   Social Determinants of Health   Financial Resource Strain: Low Risk  (12/23/2020)   Overall Financial Resource Strain (CARDIA)    Difficulty of Paying Living Expenses: Not hard at all  Food Insecurity: No Food Insecurity (10/05/2021)   Hunger Vital Sign    Worried About Running Out of Food in the Last Year: Never true    Ran Out of Food in the Last Year: Never true  Transportation Needs: No Transportation Needs (10/05/2021)   PRAPARE - Hydrologist (Medical): No    Lack of Transportation (Non-Medical): No  Physical Activity: Insufficiently Active (12/23/2020)   Exercise Vital Sign    Days of Exercise per Week: 3 days    Minutes of Exercise per Session: 10 min  Stress: No Stress Concern Present (12/23/2020)   [place]    Feeling of Stress : Not at all  Social Connections: [place] (12/23/2020)   Social Connection and Isolation Panel [NHANES]    Frequency of Communication with Friends and Family: Twice a week    Frequency of Social Gatherings with Friends and Family: More than three times a week    Attends Religious Services: More than 4 times per year    Active Member of [employer] or Organizations: Yes    Attends Music therapist: More than 4 times per year    Marital Status: Married  Human resources officer Violence: Not At Risk (12/23/2020)   Humiliation, Afraid, Rape, and Kick questionnaire    Fear of Current or Ex-Partner: No    Emotionally Abused: No    Physically Abused: No    Sexually Abused: No   Review of Systems No trouble swallowing other than some big pills      Objective:   Physical Exam Constitutional:      Appearance: Normal appearance.  Neck:      Comments: Shunt in right neck Cardiovascular:     Rate and Rhythm: Normal rate and regular rhythm.     Heart sounds: No murmur heard.    No gallop.  Pulmonary:     Effort: Pulmonary effort is normal.     Breath sounds: No wheezing.     Comments: Slight early inspiratory crackles-mostly left base Musculoskeletal:     Cervical back: Neck supple.     Comments: No sig edema  Lymphadenopathy:     Cervical: No cervical adenopathy.  Neurological:     Mental Status: He is alert.            Assessment & Plan:

## 2021-11-15 NOTE — Assessment & Plan Note (Addendum)
I suspect his wheezing is fluid overload---though weight only up slightly Does have PND and orthopnea Not really wheezing now that I can hear Doesn't seem to be asthma exacerbation Will increase the furosemide to 80 bid for 1-2 weeks and have recheck with Dr Damita Dunnings

## 2021-11-21 ENCOUNTER — Encounter: Payer: Self-pay | Admitting: Endocrinology

## 2021-11-21 DIAGNOSIS — E1165 Type 2 diabetes mellitus with hyperglycemia: Secondary | ICD-10-CM

## 2021-11-21 MED ORDER — TIRZEPATIDE 2.5 MG/0.5ML ~~LOC~~ SOAJ: 2.5000 mg | SUBCUTANEOUS | 0 refills | Status: DC

## 2021-11-23 ENCOUNTER — Other Ambulatory Visit: Payer: Self-pay

## 2021-11-23 MED ORDER — TIRZEPATIDE 5 MG/0.5ML ~~LOC~~ SOAJ: 5.0000 mg | SUBCUTANEOUS | 1 refills | Status: DC

## 2021-11-25 ENCOUNTER — Encounter: Payer: Self-pay | Admitting: Family Medicine

## 2021-11-25 ENCOUNTER — Ambulatory Visit (INDEPENDENT_AMBULATORY_CARE_PROVIDER_SITE_OTHER): Payer: Medicare Other | Admitting: Family Medicine

## 2021-11-25 VITALS — BP 118/62 | HR 66 | Temp 97.4°F | Ht 68.0 in | Wt 262.0 lb

## 2021-11-25 DIAGNOSIS — I255 Ischemic cardiomyopathy: Secondary | ICD-10-CM

## 2021-11-25 DIAGNOSIS — I10 Essential (primary) hypertension: Secondary | ICD-10-CM

## 2021-11-25 DIAGNOSIS — I5022 Chronic systolic (congestive) heart failure: Secondary | ICD-10-CM

## 2021-11-25 LAB — BASIC METABOLIC PANEL
BUN: 26 mg/dL — ABNORMAL HIGH (ref 6–23)
CO2: 34 mEq/L — ABNORMAL HIGH (ref 19–32)
Calcium: 8.7 mg/dL (ref 8.4–10.5)
Chloride: 97 mEq/L (ref 96–112)
Creatinine, Ser: 1.29 mg/dL (ref 0.40–1.50)
GFR: 56.25 mL/min — ABNORMAL LOW (ref >60.00)
Glucose, Bld: 81 mg/dL (ref 70–99)
Potassium: 3.9 mEq/L (ref 3.5–5.1)
Sodium: 138 mEq/L (ref 135–145)

## 2021-11-25 MED ORDER — FUROSEMIDE 80 MG PO TABS: 80.0000 mg | ORAL_TABLET | Freq: Every day | ORAL | Status: DC

## 2021-11-25 NOTE — Patient Instructions (Signed)
If your AM weight is 262 or below, then take 1 furosemide a day.   If your AM weight is 263 or above, then take 2 furosemide a day.   Take care.  Glad to see you. Go to the lab on the way out.   If you have mychart we'll likely use that to update you.    If your keep losing weight on 1 tab a day, then let me know.

## 2021-11-25 NOTE — Progress Notes (Unsigned)
Weight down from 268 to 262.  He feels better.  He is still taking 2 lasix a day in the meantime, up from 1 tab a day.  Wheeze got better after taking higher dose of lasix.  Prev with nocturnal wheeze, better now.

## 2021-11-26 ENCOUNTER — Other Ambulatory Visit: Payer: Self-pay | Admitting: Neurology

## 2021-11-27 NOTE — Assessment & Plan Note (Signed)
Discussed goal weight of 262. If AM weight is 262 or below, then take 1 furosemide a day.   If AM weight is 263 or above, then take 2 furosemide a day.   See notes on labs. If he continues losing weight on 1 tab a day, then he will let me know.

## 2021-11-28 NOTE — Progress Notes (Signed)
Assessment/Plan:   1.  Parkinsons Disease             -Status post bilateral DBS STN surgery             -Battery changes occurring September 05, 2012; June 10, 2015; November 13, 2017; 09/25/19.  Has about 1 year left on current battery             -pt with numerous falls but not so many with the U step walker (more with the regular walker).  May be close to being WC bound but in the meantime, use the Ustep             -Take carbidopa/levodopa 25/100 CR, currently taking 2 tablets up to 5 times per day (generally takes 9 or 10 tablets/day)             -Take carbidopa/levodopa 50/200 CR at bedtime   -Patient does have just a small amount of right-sided tremor.  However, when I tried to increase his stimulation on that side (which is quite low) he had right-sided face pull.  In the past, I have tried to set up the DBS differently and he just did not like it.  We decided to leave it where it is right now, and think about setting it up differently when he is due for battery change in about a year.   2.  RBD             -He has tried clonazepam several times and ultimately ends up stopping it because of side effect.  Could try Rozerem in the future if insurance would pay.  Decided to hold off for now as the patient states that while dreaming is vivid, it is not particularly bothersome and he is not falling out of the bed.  Discussed bedroom safety. 3.  OSAS, noncompliant with CPAP.             -Patient aware of risks/morbidity and mortality associated with untreated sleep apnea 4.  CHF             -ICD placed on 11/15/2013  -DBS will need placed into special mode if pt ever requires cardioversion 5.  Facial paresthesias             -Has been going on for few years and became bilateral, not c/w TN.  He has had several negative noncontrast CTs of the brain.  Kidney function is not good enough for contrasted CT.  His DBS device is not MRI compatible.             -I am unsure what this is, and offered to  try carbamazepine, but he really did not want that.             -I have turned off his DBS and he noticed no change in the facial paresthesias. 6.  Dysphagia  -Patient had updated modified barium swallow on March 30, 2021.  Patient with mild oral phase dysphagia and mild to moderate pharyngeal phase dysphagia and also the appearance of esophageal phase dysphagia.  The speech therapist recommended for his regular diet, but if he has more coughing or other dysphagia symptoms, to consider nectar thick liquids.  -It should be noted that Parkinson's disease does not cause esophageal phase dysphagia and this may need to be evaluated by gastroenterology.  It could certainly be responsible for the other phases of dysphagia.  7.  Myoclonus  -off of gabapentin now  -on cymbalta  30 mg for PN 8.  Hallucinations  -add nuplazid  -last EKG is dated but QT was good then  -r/b/se discussed  -samples of nuplazid given   Subjective:   Henry [name] was seen today in follow up for Parkinsons disease.  My previous records were reviewed prior to todays visit as well as outside records available to me.  Patient with wife who supplements history.  I decreased the patient's gabapentin last visit because of myoclonus.  I added Cymbalta.  He reports he is no longer taking the gabapentin.  Patient on Mounjaro.  Seeing Dr. Dwyane Dee.  Several falls since last visit.  Turns when falls and when backs up.  He has mostly fallen when he uses the old metal walker and not the U step.   He slid out of the chair last visit.  Hasn't gotten seriously hurt since last visit.  He is having hallucinations of people daily  Current prescribed movement disorder medications: Carbidopa/levodopa 25/100 CR, 2 tablets at 7 AM/11 AM/3 PM/1 tablet at 7 PM (he reports he is not taking the 7am but he takes 2 in the middle of the night/early AM instead) Carbidopa/levodopa 50/200 CR at bedtime  Cymbalta, 30 mg daily   ALLERGIES:   Allergies   Allergen Reactions   Penicillins Anaphylaxis and Rash    Because of a history of documented adverse serious drug reaction;Medi Alert bracelet  is recommended PATIENT HAS HAD A PCN REACTION WITH IMMEDIATE RASH, FACIAL/TONGUE/THROAT SWELLING, SOB, OR LIGHTHEADEDNESS WITH HYPOTENSION:  #  #  YES  #  #  Has patient had a PCN reaction causing severe rash involving mucus membranes or skin necrosis: unknown Has patient had a PCN reaction that required hospitalization NO Has patient had a PCN reaction occurring within the last 10 years: NO   Klonopin [Clonazepam] Other (See Comments)    agitation   Peanut-Containing Drug Products Cough   Watermelon [Citrullus Vulgaris] Other (See Comments) and Cough    Tickle in throat    CURRENT MEDICATIONS:  Outpatient Encounter Medications as of 12/05/2021  Medication Sig   acetaminophen (TYLENOL) 500 MG tablet Take 1,000 mg by mouth at bedtime as needed for mild pain or headache.    albuterol (VENTOLIN HFA) 108 (90 Base) MCG/ACT inhaler Inhale 2 puffs into the lungs every 6 (six) hours as needed for wheezing or shortness of breath.   allopurinol (ZYLOPRIM) 100 MG tablet TAKE 1 & 1/2 TABLETS BY MOUTH DAILY   aspirin EC 81 MG tablet Take 1 tablet (81 mg total) by mouth daily. Swallow whole.   carbidopa-levodopa (SINEMET CR) 50-200 MG tablet TAKE 1 TABLET BY MOUTH EVERYDAY AT BEDTIME   Carbidopa-Levodopa ER (SINEMET CR) 25-100 MG tablet controlled release TAKE 2 TABLETS BY MOUTH 5 TIMES PER DAY AS DIRECTED   clotrimazole (LOTRIMIN) 1 % cream Apply 1 Application topically 2 (two) times daily.   colchicine 0.6 MG tablet Take 1 tablet (0.6 mg total) by mouth daily as needed (gout).   Continuous Blood Gluc Receiver (DEXCOM G6 RECEIVER) DEVI Use to check blood sugar daily   Continuous Blood Gluc Sensor (DEXCOM G6 SENSOR) MISC Use to check blood sugar daily   dorzolamide-timolol (COSOPT) 22.3-6.8 MG/ML ophthalmic solution Place 1 drop into the right eye 2 (two) times  daily.   DULoxetine (CYMBALTA) 30 MG capsule TAKE 1 CAPSULE BY MOUTH EVERY DAY   furosemide (LASIX) 80 MG tablet Take 1 tablet (80 mg total) by mouth daily. With extra tab a day if  AM weight is above 263 lbs.   HUMALOG 100 UNIT/ML injection USE MAXIMUM 76 UNITS PER   DAY WITH V-GO PUMP   Insulin Disposable Pump (OMNIPOD 5 G6 INTRO, GEN 5,) KIT 1 kit by Does not apply route daily.   Insulin Disposable Pump (OMNIPOD 5 G6 POD, GEN 5,) MISC CHANGE POD EVERY 3 DAYS   KLOR-CON M20 20 MEQ tablet TAKE 3 TABLETS (60 MEQ) BY MOUTH DAILY.   levothyroxine (SYNTHROID) 137 MCG tablet Take 1 tab daily with 1 extra tablet one day a week   losartan (COZAAR) 25 MG tablet Take 1 tablet (25 mg total) by mouth daily. Please keep scheduled appointment   metoprolol succinate (TOPROL-XL) 50 MG 24 hr tablet TAKE 1 TABLET BY MOUTH EVERY DAY WITH OR IMMEDIATELY FOLLOWING A MEAL   omega-3 acid ethyl esters (LOVAZA) 1 g capsule TAKE 2 CAPSULES BY MOUTH TWICE A DAY   OVER THE COUNTER MEDICATION Apply 1 application topically daily as needed (pain). Theraworx Pain Cream   polyethylene glycol (MIRALAX / GLYCOLAX) packet Take 8.5 g by mouth daily.   rosuvastatin (CRESTOR) 20 MG tablet TAKE 1 TABLET BY MOUTH EVERYDAY AT BEDTIME   tirzepatide (MOUNJARO) 5 MG/0.5ML Pen Inject 5 mg into the skin once a week.   traMADol (ULTRAM) 50 MG tablet TAKE 1 TABLET BY MOUTH TWICE A DAY   vitamin B-12 (CYANOCOBALAMIN) 1000 MCG tablet Take 1,000 mcg by mouth daily.   Cholecalciferol (VITAMIN D) 50 MCG (2000 UT) tablet Take 2,000 Units by mouth daily.    Continuous Blood Gluc Transmit (DEXCOM G6 TRANSMITTER) MISC Change every 3 months   fluticasone-salmeterol (ADVAIR) 250-50 MCG/ACT AEPB Inhale 1 puff into the lungs in the morning and at bedtime. Rinse after use.   FREESTYLE LITE test strip USE TO CHECK BLOOD SUGAR 4 TIMES DAILY.   nitroGLYCERIN (NITROSTAT) 0.4 MG SL tablet Place 1 tablet (0.4 mg total) under the tongue every 5 (five) minutes as  needed for chest pain.   No facility-administered encounter medications on file as of 12/05/2021.    Objective:   PHYSICAL EXAMINATION:    VITALS:   Vitals:   12/05/21 1248  BP: 126/76  Pulse: 66  SpO2: 96%  Weight: 266 lb 9.6 oz (120.9 kg)  Height: _0  (1.753 m)       GEN:  The patient appears stated age and is in NAD. HEENT:  Normocephalic, atraumatic.  The mucous membranes are moist. The superficial temporal arteries are without ropiness or tenderness. Cardiovascular: Regular rate rhythm Lungs: Clear to auscultation bilaterally  Neurological examination:  Orientation: The patient is alert and oriented to person, place, and time.  He asks appropriate questions and is very interactive today. Cranial nerves: There is good facial symmetry with facial hypomimia. The speech is fluent and dysarthric (baseline) he is hypophonic.Marland Kitchen Soft palate rises symmetrically and there is no tongue deviation. Hearing is intact to conversational tone. Sensation: Sensation is intact to light touch throughout Motor: Strength is at least antigravity x4.  Movement examination: Tone: There is normal tone in the upper and lower extremities Abnormal movements: Very rare rest tremor on the right today, which is same as last visit. Coordination:  There is decremation with RAM's, mostly with foot taps on the left and finger taps on the L Gait and Station: Patient had his U step in the office today.  He was flexed at the waist, but actually ambulated quite well today and did fairly well in the turns.  Exam is  similar to last visit  I have reviewed and interpreted the following labs independently    Chemistry      Component Value Date/Time   NA 138 11/25/2021 1222   NA 140 03/14/2021 1219   K 3.9 11/25/2021 1222   CL 97 11/25/2021 1222   CO2 34 (H) 11/25/2021 1222   BUN 26 (H) 11/25/2021 1222   BUN 17 03/14/2021 1219   CREATININE 1.29 11/25/2021 1222   CREATININE 2.13 06/25/2017 0000    CREATININE 1.53 (H) 05/22/2016 1106      Component Value Date/Time   CALCIUM 8.7 11/25/2021 1222   ALKPHOS 72 05/31/2021 1055   AST 7 05/31/2021 1055   ALT 4 05/31/2021 1055   BILITOT 0.5 05/31/2021 1055       Lab Results  Component Value Date   WBC 12.2 (H) 05/24/2020   HGB 14.8 05/24/2020   HCT 44.0 05/24/2020   MCV 84.2 05/24/2020   PLT 121.0 (L) 05/24/2020    Lab Results  Component Value Date   TSH 5.58 (H) 08/31/2021     Total time spent on today's visit was 30 minutes, including both face-to-face time and nonface-to-face time.  Time included that spent on review of records (prior notes available to me/labs/imaging if pertinent), discussing treatment and goals, answering patient's questions and coordinating care.  This was independent of his DBS time, on a separate note  Cc:  Tonia Ghent, MD

## 2021-12-05 ENCOUNTER — Encounter: Payer: Self-pay | Admitting: Neurology

## 2021-12-05 ENCOUNTER — Other Ambulatory Visit: Payer: Self-pay

## 2021-12-05 ENCOUNTER — Ambulatory Visit (INDEPENDENT_AMBULATORY_CARE_PROVIDER_SITE_OTHER): Payer: Medicare Other | Admitting: Neurology

## 2021-12-05 VITALS — BP 126/76 | HR 66 | Ht 69.0 in | Wt 266.6 lb

## 2021-12-05 DIAGNOSIS — R441 Visual hallucinations: Secondary | ICD-10-CM

## 2021-12-05 DIAGNOSIS — G20A2 Parkinson's disease without dyskinesia, with fluctuations: Secondary | ICD-10-CM | POA: Diagnosis not present

## 2021-12-05 DIAGNOSIS — I255 Ischemic cardiomyopathy: Secondary | ICD-10-CM | POA: Diagnosis not present

## 2021-12-05 DIAGNOSIS — G20A1 Parkinson's disease without dyskinesia, without mention of fluctuations: Secondary | ICD-10-CM

## 2021-12-05 DIAGNOSIS — R443 Hallucinations, unspecified: Secondary | ICD-10-CM

## 2021-12-05 MED ORDER — NUPLAZID 34 MG PO CAPS: ORAL_CAPSULE | ORAL | 0 refills | Status: DC

## 2021-12-05 MED ORDER — NUPLAZID 34 MG PO CAPS: 1.0000 | ORAL_CAPSULE | Freq: Every day | ORAL | 1 refills | Status: DC

## 2021-12-05 MED ORDER — NUPLAZID 34 MG PO CAPS: 1.0000 | ORAL_CAPSULE | Freq: Every day | ORAL | 0 refills | Status: DC

## 2021-12-05 NOTE — Procedures (Signed)
DBS Programming was performed.    Manufacturer of DBS device: Medtronic  Total time spent programming was 7 minutes.  Device was confirmed to be on.  Soft start was confirmed to be on.  Impedences were checked and were within normal limits.  Battery was checked and was 43%.    Final settings were as follows with Group B active:   Active Contact Amplitude  PW (ms) Frequency (hz) Side Effects Battery  Left Brain        09/08/19 1-2+ 4.4V 90 170  2.59  10/08/19             Group A 1-2+ 4.32m 90 170         Group B 0-1-2-C+ 1.1 80 125     0- (1.1)1-(1.0)2-(0.6)       01/19/20             Group B 0-1-2-C+ 1.1 80 125  4 yrs 9 months  05/27/20 Group B 0-1-2-C+ 1.3 80 125  3 years, 1 month  11/30/20 0-1-2-C+ 1.3 80 125  2 years, 5 months  05/31/21 0-1-2-C+ 1.3 80 125 Higher amp with face pull 1 yr, 10 months   12/05/21 0-1-2-C+ 1.4 80 125  1 yr, 3 months                  Right Brain        09/08/19 4-7+ 4.5V 90 170    10/08/19             Group A 4-7+ 2.542m90 170         Group B 4-7-C+ 2.5 60 125     4-(2.5)7-(0.6)C+       01/19/20             Group B 4-7-C+ 2.8 60 165    05/27/20 group B 4-7-C+ 2.8 60 165    11/30/20 4-7-C+ 2.8 60 165    05/31/21 4-7-C+ 2.9 60 165    12/05/21 4-7-C+ 2.9 60 165

## 2021-12-06 ENCOUNTER — Other Ambulatory Visit: Payer: Self-pay | Admitting: Endocrinology

## 2021-12-06 DIAGNOSIS — E1165 Type 2 diabetes mellitus with hyperglycemia: Secondary | ICD-10-CM

## 2021-12-08 ENCOUNTER — Other Ambulatory Visit (HOSPITAL_COMMUNITY): Payer: Self-pay

## 2021-12-14 ENCOUNTER — Telehealth: Payer: Self-pay

## 2021-12-14 ENCOUNTER — Encounter: Payer: Self-pay | Admitting: Endocrinology

## 2021-12-14 DIAGNOSIS — E1165 Type 2 diabetes mellitus with hyperglycemia: Secondary | ICD-10-CM

## 2021-12-14 NOTE — Telephone Encounter (Signed)
Sent to [facility] for this patient

## 2021-12-15 MED ORDER — DEXCOM G6 SENSOR MISC: 3 refills | Status: DC

## 2021-12-15 MED ORDER — DEXCOM G6 TRANSMITTER MISC: 3 refills | Status: DC

## 2021-12-16 ENCOUNTER — Encounter: Payer: Self-pay | Admitting: Family

## 2021-12-16 ENCOUNTER — Ambulatory Visit (INDEPENDENT_AMBULATORY_CARE_PROVIDER_SITE_OTHER): Payer: Medicare Other | Admitting: Family

## 2021-12-16 ENCOUNTER — Ambulatory Visit (INDEPENDENT_AMBULATORY_CARE_PROVIDER_SITE_OTHER)
Admission: RE | Admit: 2021-12-16 | Discharge: 2021-12-16 | Disposition: A | Discharge status: Home / Self Care | Payer: Medicare Other | Source: Ambulatory Visit | Attending: Family | Admitting: Family

## 2021-12-16 VITALS — BP 120/64 | HR 73 | Temp 97.9°F | Resp 16 | Ht 69.0 in | Wt 267.4 lb

## 2021-12-16 DIAGNOSIS — R062 Wheezing: Secondary | ICD-10-CM | POA: Diagnosis not present

## 2021-12-16 DIAGNOSIS — I5043 Acute on chronic combined systolic (congestive) and diastolic (congestive) heart failure: Secondary | ICD-10-CM | POA: Diagnosis not present

## 2021-12-16 DIAGNOSIS — R0609 Other forms of dyspnea: Secondary | ICD-10-CM

## 2021-12-16 DIAGNOSIS — J020 Streptococcal pharyngitis: Secondary | ICD-10-CM | POA: Diagnosis not present

## 2021-12-16 LAB — POCT RAPID STREP A (OFFICE): Rapid Strep A Screen: POSITIVE — AB

## 2021-12-16 MED ORDER — CLINDAMYCIN HCL 300 MG PO CAPS: 300.0000 mg | ORAL_CAPSULE | Freq: Three times a day (TID) | ORAL | 0 refills | Status: AC

## 2021-12-16 NOTE — Patient Instructions (Signed)
  You were found to be strep positive,  Take antibiotics that have been sent to the pharmacy.  Change your toothbrush after 24 hours on the antibiotics.  Gargle with warm salt water as needed for sore throat.   Complete xray(s) prior to leaving today. I will notify you of your results once received.  Call cardiologist to make f/u as your edema and sob is worsening at 80 mg furosemide.    Regards,   Eugenia Pancoast FNP-C

## 2021-12-16 NOTE — Assessment & Plan Note (Signed)
Increased doe, will obtain cxr to r/o pleural effusion and or opacity in lungs, pending results.  Advised pt to f/u with cardiologist for CHF management. Conitnue fursoedmie 80 mg once daily, and use prn 80 mg as needed for weight gain. If sob or doe suddenly worsens or worsening disorientation advised pt to go immediately to er.

## 2021-12-16 NOTE — Progress Notes (Signed)
Established Patient Office Visit  Subjective:  Patient ID: Henry [name], male    DOB: 1950/04/16  Age: 71 y.o. MRN: [id number]  CC:  Chief Complaint  Patient presents with   Sore Throat    X 3 days    HPI ADALID BECKMANN is here today with concerns.   Two days ago started with sore throat. Having some coughing spells in the last few days as well. He has been questioning if he is having reflux or no. Does not have tonsils anymore. No neck tenderness. No sinus pressure. Some slight nasal congestion.   Has also noticed some increased edema in the last few days on bil lower ext.with some worsening DOE. Wife carol states that he has breathing a little harder in the last few days.   CHF, seems to be getting worse. 80 mg furosemide daily and then >263 pounds takes extra tablet as well. He has really only been taking once daily.  Wt Readings from Last 3 Encounters:  12/16/21 267 lb 6 oz (121.3 kg)  12/05/21 266 lb 9.6 oz (120.9 kg)  11/25/21 262 lb (118.8 kg)   Wife does state a little more increased confusion in the last few days slightly.    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)   DM (diabetes mellitus) ([place])    TYPE 2 - insulin pump   Dysrhythmia    a-fib   GERD (gastroesophageal reflux disease)    Glaucoma    right eye   History of kidney stones    multiple   HLD (hyperlipidemia)    HTN (hypertension)    pt denies 08/19/12   Hyperplasia, prostate    Hypothyroidism    MI (myocardial infarction) ([place])    Dr Crenshaw 2000, x3vessels bypass   OSA (obstructive sleep apnea)    AHI-28,on CPAP, noncompliant with CPAP    Parkinson disease    1999   PONV (postoperative nausea and vomiting)    Restless legs    Shortness of breath    Hx: of at all times   UTI (lower urinary tract infection) 09/15/12   Klebsiella   Ventral hernia    Walker as ambulation aid    also uses wheelchair at home/when going out    Past Surgical History:  Procedure Laterality Date   [place]   right total loss   BIOPSY  10/14/2018   Procedure: BIOPSY;  Surgeon: Jerene Bears, MD;  Location: [facility];  Service: Gastroenterology;;   CARDIAC CATHETERIZATION     CATARACT EXTRACTION W/ INTRAOCULAR LENS IMPLANT     Hx: of right eye   CATARACT EXTRACTION W/ INTRAOCULAR LENS IMPLANT Left 2018   COLONOSCOPY N/A 10/13/2014   Procedure: COLONOSCOPY;  Surgeon: Jerene Bears, MD;  Location: [facility];  Service: Gastroenterology;  Laterality: N/A;   COLONOSCOPY W/ BIOPSIES AND POLYPECTOMY     Hx: of   COLONOSCOPY WITH PROPOFOL N/A 10/14/2018   Procedure: COLONOSCOPY WITH PROPOFOL;  Surgeon: Jerene Bears, MD;  Location:  [facility];  Service: Gastroenterology;  Laterality: N/A;   CORONARY ARTERY BYPASS GRAFT  2000   [name], MD   CORONARY STENT PLACEMENT  1998   DEEP BRAIN STIMULATOR PLACEMENT  2004   Right and left VIN stimulator placement (parkinsons)   EYE SURGERY     FINGER AMPUTATION     left pointer   IMPLANTABLE CARDIOVERTER DEFIBRILLATOR IMPLANT N/A 11/13/2013   Procedure: IMPLANTABLE CARDIOVERTER DEFIBRILLATOR IMPLANT;  Surgeon: Evans Lance, MD;  Location: [facility] CATH LAB;  Service: Cardiovascular;  Laterality: N/A;   INSERT / REPLACE / REMOVE PACEMAKER     medtronic   LEFT AND RIGHT HEART CATHETERIZATION WITH CORONARY ANGIOGRAM N/A 09/24/2013   Procedure: LEFT AND RIGHT HEART CATHETERIZATION WITH CORONARY ANGIOGRAM;  Surgeon: Burnell Blanks, MD;  Location: [facility] C[facility] LAB;  Service: Cardiovascular;  Laterality: N/A;   LITHOTRIPSY     3 different times   MEDIAN STERNOTOMY  2000    POLYPECTOMY  10/14/2018   Procedure: POLYPECTOMY;  Surgeon: Jerene Bears, MD;  Location: [facility];  Service: Gastroenterology;;   PULSE GENERATOR IMPLANT Right 11/13/2017   Procedure: Right chest implantable pulse generator change;  Surgeon: Erline Levine, MD;  Location: [place];  Service: Neurosurgery;  Laterality: Right;  Right chest implantable pulse generator change   SUBTHALAMIC STIMULATOR BATTERY REPLACEMENT N/A 09/05/2012   Procedure: Deep brain stimulator battery change;  Surgeon: Erline Levine, MD;  Location: [facility];  Service: Neurosurgery;  Laterality: N/A;  Deep brain stimulator battery change   SUBTHALAMIC STIMULATOR BATTERY REPLACEMENT N/A 06/10/2015   Procedure: Deep Brain stimulator battery change;  Surgeon: Erline Levine, MD;  Location: [place] [facility]URO [facility]S;  Service: Neurosurgery;  Laterality: N/A;   SUBTHALAMIC STIMULATOR BATTERY REPLACEMENT Right 09/25/2019   Procedure: Deep brain stimulator battery change;  Surgeon: Erline Levine, MD;  Location: [place];  Service: Neurosurgery;  Laterality: Right;   TONSILLECTOMY      Family History  Problem Relation Age of Onset   Aneurysm Mother    Alcoholism Father    HIV/AIDS Brother 56       AIDS   Healthy Sister    Healthy Child    Peripheral vascular disease Other    Arthritis Other    Healthy Sister    Healthy Child    Healthy Child    Diabetes Neg Hx    Heart disease Neg Hx    Colon cancer Neg Hx    Prostate cancer Neg Hx     Social History   Socioeconomic History   Marital status: Married    Spouse name: [name]   Number of children: 2   Years of education: 12   Highest education level: High school graduate  Occupational History   Occupation: DISABLED    Comment: [employer]  Tobacco Use   Smoking status: Never    Passive exposure: Never   Smokeless tobacco: Never  Vaping Use   Vaping Use: Never used  Substance and Sexual Activity   Alcohol use: Not Currently   Drug use: No   Sexual activity: Not  Currently  Other Topics Concern   Not on file  Social History Narrative   From [place]   Retired/disability Clinical research associate   Likes to fish.     Married 1972   3 kids   Andrew fan   Social Determinants of Health   Financial Resource Strain: Low Risk  (12/23/2020)   Overall Financial Resource Strain (CARDIA)    Difficulty of  Paying Living Expenses: Not hard at all  Food Insecurity: No Food Insecurity (10/05/2021)   Hunger Vital Sign    Worried About Running Out of Food in the Last Year: Never true    Ran Out of Food in the Last Year: Never true  Transportation Needs: No Transportation Needs (10/05/2021)   PRAPARE - Hydrologist (Medical): No    Lack of Transportation (Non-Medical): No  Physical Activity: Insufficiently Active (12/23/2020)   Exercise Vital Sign    Days of Exercise per Week: 3 days    Minutes of Exercise per Session: 10 min  Stress: No Stress Concern Present (12/23/2020)   [place]    Feeling of Stress : Not at all  Social Connections: [place] (12/23/2020)   Social Connection and Isolation Panel [NHANES]    Frequency of Communication with Friends and Family: Twice a week    Frequency of Social Gatherings with Friends and Family: More than three times a week    Attends Religious Services: More than 4 times per year    Active Member of [employer] or Organizations: Yes    Attends Music therapist: More than 4 times per year    Marital Status: Married  Human resources officer Violence: Not At Risk (12/23/2020)   Humiliation, Afraid, Rape, and Kick questionnaire    Fear of Current or Ex-Partner: No    Emotionally Abused: No    Physically Abused: No    Sexually Abused: No    Outpatient Medications Prior to Visit  Medication Sig Dispense Refill   acetaminophen (TYLENOL) 500 MG tablet Take 1,000 mg by mouth at bedtime as needed for mild pain or  headache.      albuterol (VENTOLIN HFA) 108 (90 Base) MCG/ACT inhaler Inhale 2 puffs into the lungs every 6 (six) hours as needed for wheezing or shortness of breath. 18 g 5   allopurinol (ZYLOPRIM) 100 MG tablet TAKE 1 & 1/2 TABLETS BY MOUTH DAILY 135 tablet 3   aspirin EC 81 MG tablet Take 1 tablet (81 mg total) by mouth daily. Swallow whole. 90 tablet 3   carbidopa-levodopa (SINEMET CR) 50-200 MG tablet TAKE 1 TABLET BY MOUTH EVERYDAY AT BEDTIME 90 tablet 0   Carbidopa-Levodopa ER (SINEMET CR) 25-100 MG tablet controlled release TAKE 2 TABLETS BY MOUTH 5 TIMES PER DAY AS DIRECTED 900 tablet 0   Cholecalciferol (VITAMIN D) 50 MCG (2000 UT) tablet Take 2,000 Units by mouth daily.      clotrimazole (LOTRIMIN) 1 % cream Apply 1 Application topically 2 (two) times daily. 30 g 0   colchicine 0.6 MG tablet Take 1 tablet (0.6 mg total) by mouth daily as needed (gout).     Continuous Blood Gluc Receiver (DEXCOM G6 RECEIVER) DEVI Use to check blood sugar daily 1 each 0   Continuous Blood Gluc Sensor (DEXCOM G6 SENSOR) MISC Use to check blood sugar daily 9 each 3   Continuous Blood Gluc Transmit (DEXCOM G6 TRANSMITTER) MISC Change every 3 months 1 each 3   dorzolamide-timolol (COSOPT) 22.3-6.8 MG/ML ophthalmic solution Place 1 drop into the right eye 2 (two) times daily.     DULoxetine (CYMBALTA) 30 MG capsule TAKE 1 CAPSULE BY MOUTH EVERY DAY 90 capsule 1   fluticasone-salmeterol (ADVAIR) 250-50 MCG/ACT AEPB Inhale 1 puff into the lungs in the morning and at bedtime. Rinse after use. 180 each 3   FREESTYLE LITE test strip USE TO CHECK BLOOD  SUGAR 4 TIMES DAILY. 150 strip 3   furosemide (LASIX) 80 MG tablet Take 1 tablet (80 mg total) by mouth daily. With extra tab a day if AM weight is above 263 lbs.     HUMALOG 100 UNIT/ML injection USE MAXIMUM 76 UNITS PER   DAY WITH V-GO PUMP 60 mL 3   Insulin Disposable Pump (OMNIPOD 5 G6 INTRO, GEN 5,) KIT 1 kit by Does not apply route daily. 1 kit 0   Insulin  Disposable Pump (OMNIPOD 5 G6 POD, GEN 5,) MISC CHANGE POD EVERY 3 DAYS 10 each 3   KLOR-CON M20 20 MEQ tablet TAKE 3 TABLETS (60 MEQ) BY MOUTH DAILY. 270 tablet 1   levothyroxine (SYNTHROID) 137 MCG tablet Take 1 tab daily with 1 extra tablet one day a week 90 tablet 1   losartan (COZAAR) 25 MG tablet Take 1 tablet (25 mg total) by mouth daily. Please keep scheduled appointment 90 tablet 0   metoprolol succinate (TOPROL-XL) 50 MG 24 hr tablet TAKE 1 TABLET BY MOUTH EVERY DAY WITH OR IMMEDIATELY FOLLOWING A MEAL 90 tablet 1   omega-3 acid ethyl esters (LOVAZA) 1 g capsule TAKE 2 CAPSULES BY MOUTH TWICE A DAY 360 capsule 1   OVER THE COUNTER MEDICATION Apply 1 application topically daily as needed (pain). Theraworx Pain Cream     Pimavanserin Tartrate (NUPLAZID) 34 MG CAPS Samples of this drug were given to the patient, quantity 5, Lot Number [id number] Exp 05/2022 30 capsule 0   Pimavanserin Tartrate (NUPLAZID) 34 MG CAPS Take 1 capsule (34 mg total) by mouth daily. 90 capsule 1   polyethylene glycol (MIRALAX / GLYCOLAX) packet Take 8.5 g by mouth daily.     rosuvastatin (CRESTOR) 20 MG tablet TAKE 1 TABLET BY MOUTH EVERYDAY AT BEDTIME 90 tablet 1   tirzepatide (MOUNJARO) 5 MG/0.5ML Pen Inject 5 mg into the skin once a week. 6 mL 1   traMADol (ULTRAM) 50 MG tablet TAKE 1 TABLET BY MOUTH TWICE A DAY 60 tablet 2   vitamin B-12 (CYANOCOBALAMIN) 1000 MCG tablet Take 1,000 mcg by mouth daily.     nitroGLYCERIN (NITROSTAT) 0.4 MG SL tablet Place 1 tablet (0.4 mg total) under the tongue every 5 (five) minutes as needed for chest pain. 25 tablet 12   No facility-administered medications prior to visit.    Allergies  Allergen Reactions   Penicillins Anaphylaxis and Rash    Because of a history of documented adverse serious drug reaction;Medi Alert bracelet  is recommended PATIENT HAS HAD A PCN REACTION WITH IMMEDIATE RASH, FACIAL/TONGUE/THROAT SWELLING, SOB, OR LIGHTHEADEDNESS WITH HYPOTENSION:  #  #  YES   #  #  Has patient had a PCN reaction causing severe rash involving mucus membranes or skin necrosis: unknown Has patient had a PCN reaction that required hospitalization NO Has patient had a PCN reaction occurring within the last 10 years: NO   Klonopin [Clonazepam] Other (See Comments)    agitation   Peanut-Containing Drug Products Cough   Watermelon [Citrullus Vulgaris] Other (See Comments) and Cough    Tickle in throat        Objective:    Physical Exam Vitals reviewed.  Constitutional:      General: He is awake. He is not in acute distress.    Appearance: Normal appearance. He is well-developed. He is obese. He is not ill-appearing.  HENT:     Right Ear: Tympanic membrane normal.     Left Ear: Tympanic membrane normal.  Nose: Nose normal.     Right Turbinates: Not enlarged or swollen.     Left Turbinates: Not enlarged or swollen.     Right Sinus: No maxillary sinus tenderness or frontal sinus tenderness.     Left Sinus: No maxillary sinus tenderness or frontal sinus tenderness.     Mouth/Throat:     Mouth: Mucous membranes are moist.     Pharynx: No pharyngeal swelling, oropharyngeal exudate or posterior oropharyngeal erythema.  Eyes:     Extraocular Movements: Extraocular movements intact.     Pupils: Pupils are equal, round, and reactive to light.  Cardiovascular:     Rate and Rhythm: Normal rate and regular rhythm.  Pulmonary:     Effort: Accessory muscle usage present. No prolonged expiration.     Breath sounds: Wheezing (expiratory bil upper lobes) present.  Musculoskeletal:     Right lower leg: 2+ Pitting Edema present.     Left lower leg: 2+ Pitting Edema present.  Lymphadenopathy:     Cervical: Cervical adenopathy present.     Right cervical: Superficial cervical adenopathy present.     Left cervical: Superficial cervical adenopathy and deep cervical adenopathy present.  Neurological:     Mental Status: He is alert.     BP 120/64   Pulse 73   Temp  97.9 F (36.6 C)   Resp 16   Ht _0  (1.753 m)   Wt 267 lb 6 oz (121.3 kg)   SpO2 94%   BMI 39.48 kg/m  Wt Readings from Last 3 Encounters:  12/16/21 267 lb 6 oz (121.3 kg)  12/05/21 266 lb 9.6 oz (120.9 kg)  11/25/21 262 lb (118.8 kg)     Health Maintenance Due  Topic Date Due   Zoster Vaccines- Shingrix (1 of 2) Never done   COVID-19 Vaccine (3 - 2023-24 season) 09/09/2021   FOOT EXAM  10/07/2021   COLONOSCOPY (Pts 45-32yr Insurance coverage will need to be confirmed)  10/13/2021   Medicare Annual Wellness (AWV)  12/23/2021    There are no preventive care reminders to display for this patient.  Lab Results  Component Value Date   TSH 5.58 (H) 08/31/2021   Lab Results  Component Value Date   WBC 12.2 (H) 05/24/2020   HGB 14.8 05/24/2020   HCT 44.0 05/24/2020   MCV 84.2 05/24/2020   PLT 121.0 (L) 05/24/2020   Lab Results  Component Value Date   NA 138 11/25/2021   K 3.9 11/25/2021   CO2 34 (H) 11/25/2021   GLUCOSE 81 11/25/2021   BUN 26 (H) 11/25/2021   CREATININE 1.29 11/25/2021   BILITOT 0.5 05/31/2021   ALKPHOS 72 05/31/2021   AST 7 05/31/2021   ALT 4 05/31/2021   PROT 6.5 05/31/2021   ALBUMIN 4.2 05/31/2021   CALCIUM 8.7 11/25/2021   ANIONGAP 11 09/25/2019   EGFR 65 03/14/2021   GFR 56.25 (L) 11/25/2021   Lab Results  Component Value Date   HGBA1C 6.8 (H) 08/31/2021      Assessment & Plan:   Problem List Items Addressed This Visit       Cardiovascular and Mediastinum   Acute on chronic combined systolic and diastolic congestive heart failure, NYHA class 4 (HCC)    Increased doe, will obtain cxr to r/o pleural effusion and or opacity in lungs, pending results.  Advised pt to f/u with cardiologist for CHF management. Conitnue fursoedmie 80 mg once daily, and use prn 80 mg as needed for weight gain. If  sob or doe suddenly worsens or worsening disorientation advised pt to go immediately to er.         Respiratory   Strep pharyngitis     Strep tested positive in office.  Rx clindamycin 300 mg tid x 10 days as pt allergic to pcn.  Advised to eat with food as may upset stomach.  tyelnol prn sore throat/fever Pt told to F/u if no improvement in the next 2-3 days.       Relevant Medications   clindamycin (CLEOCIN) 300 MG capsule   Other Relevant Orders   POCT rapid strep A (Completed)     Other   Wheezing - Primary   Relevant Orders   DG Chest 2 View   DOE (dyspnea on exertion)   Relevant Orders   DG Chest 2 View    Meds ordered this encounter  Medications   clindamycin (CLEOCIN) 300 MG capsule    Sig: Take 1 capsule (300 mg total) by mouth 3 (three) times daily for 10 days.    Dispense:  30 capsule    Refill:  0    Order Specific Question:   Supervising Provider    Answer:   Diona Browner, AMY E [[id number]]    Follow-up: Return in about 1 week (around 12/23/2021) for with pcp Dr. Damita Dunnings for f/u wheezing.    Eugenia Pancoast, FNP

## 2021-12-16 NOTE — Telephone Encounter (Signed)
FYI

## 2021-12-16 NOTE — Progress Notes (Signed)
Dr. Elliot Gurney. Called and spoke with wife and patient in regards to results.  No acute findings on xray however will increase furosemide to 80 mg once in am, and then again in six hours thereafter and then f/u strictly on Monday. Wife was going to make call to cardiologist first, and then if no availabilities would call us to make appt for f/u.

## 2021-12-16 NOTE — Assessment & Plan Note (Signed)
Strep tested positive in office.  Rx clindamycin 300 mg tid x 10 days as pt allergic to pcn.  Advised to eat with food as may upset stomach.  tyelnol prn sore throat/fever Pt told to F/u if no improvement in the next 2-3 days.

## 2021-12-19 NOTE — Progress Notes (Unsigned)
HPI: FU coronary artery disease status post bypass and graft, Parkinson's, diabetes and hypertension as well as hyperlipidemia. Carotid Dopplers in January 2005 showed 0-39% stenosis. Last nuclear study 6/15 showed EF 41, prior MI; no ischemia. Cardiac catheterization September 2015 showed a pulmonary Wedge pressure of 16 and PA pressure 32/20. The LAD was occluded. No obstructive disease in the circumflex. The right coronary had severe diffuse distal disease. Saphenous vein graft to the PDA was patent. Saphenous vein graft to diagonal patent. LIMA to the mid LAD patent. Medical therapy recommended. Had ICD placed November 2015.  Echocardiogram May 2021 showed ejection fraction 40 to 45% with akinesis of the anteroseptal wall and apex. Patient noted to have atrial fibrillation on device interrogation previously. He is asymptomatic with his episodes. Since last seen, he has noticed increased dyspnea on exertion, orthopnea, weight gain and bilateral lower extremity edema.  No chest pain.  His Lasix was recently increased to 80 mg twice daily with improvement.  Current Outpatient Medications  Medication Sig Dispense Refill   acetaminophen (TYLENOL) 500 MG tablet Take 1,000 mg by mouth at bedtime as needed for mild pain or headache.      albuterol (VENTOLIN HFA) 108 (90 Base) MCG/ACT inhaler Inhale 2 puffs into the lungs every 6 (six) hours as needed for wheezing or shortness of breath. 18 g 5   allopurinol (ZYLOPRIM) 100 MG tablet TAKE 1 & 1/2 TABLETS BY MOUTH DAILY 135 tablet 3   aspirin EC 81 MG tablet Take 1 tablet (81 mg total) by mouth daily. Swallow whole. 90 tablet 3   carbidopa-levodopa (SINEMET CR) 50-200 MG tablet TAKE 1 TABLET BY MOUTH EVERYDAY AT BEDTIME 90 tablet 0   Carbidopa-Levodopa ER (SINEMET CR) 25-100 MG tablet controlled release TAKE 2 TABLETS BY MOUTH 5 TIMES PER DAY AS DIRECTED 900 tablet 0   Cholecalciferol (VITAMIN D) 50 MCG (2000 UT) tablet Take 2,000 Units by mouth daily.       clindamycin (CLEOCIN) 300 MG capsule Take 1 capsule (300 mg total) by mouth 3 (three) times daily for 10 days. 30 capsule 0   clotrimazole (LOTRIMIN) 1 % cream Apply 1 Application topically 2 (two) times daily. 30 g 0   colchicine 0.6 MG tablet Take 1 tablet (0.6 mg total) by mouth daily as needed (gout).     Continuous Blood Gluc Receiver (DEXCOM G6 RECEIVER) DEVI Use to check blood sugar daily 1 each 0   Continuous Blood Gluc Sensor (DEXCOM G6 SENSOR) MISC Use to check blood sugar daily 9 each 3   Continuous Blood Gluc Transmit (DEXCOM G6 TRANSMITTER) MISC Change every 3 months 1 each 3   dorzolamide-timolol (COSOPT) 22.3-6.8 MG/ML ophthalmic solution Place 1 drop into the right eye 2 (two) times daily.     DULoxetine (CYMBALTA) 30 MG capsule TAKE 1 CAPSULE BY MOUTH EVERY DAY 90 capsule 1   fluticasone-salmeterol (ADVAIR) 250-50 MCG/ACT AEPB Inhale 1 puff into the lungs in the morning and at bedtime. Rinse after use. 180 each 3   FREESTYLE LITE test strip USE TO CHECK BLOOD SUGAR 4 TIMES DAILY. 150 strip 3   furosemide (LASIX) 80 MG tablet Take 1 tablet (80 mg total) by mouth daily. With extra tab a day if AM weight is above 263 lbs.     HUMALOG 100 UNIT/ML injection USE MAXIMUM 76 UNITS PER   DAY WITH V-GO PUMP 60 mL 3   Insulin Disposable Pump (OMNIPOD 5 G6 INTRO, GEN 5,) KIT 1 kit by  Does not apply route daily. 1 kit 0   Insulin Disposable Pump (OMNIPOD 5 G6 POD, GEN 5,) MISC CHANGE POD EVERY 3 DAYS 10 each 3   KLOR-CON M20 20 MEQ tablet TAKE 3 TABLETS (60 MEQ) BY MOUTH DAILY. 270 tablet 1   levothyroxine (SYNTHROID) 137 MCG tablet Take 1 tab daily with 1 extra tablet one day a week 90 tablet 1   losartan (COZAAR) 25 MG tablet Take 1 tablet (25 mg total) by mouth daily. Please keep scheduled appointment 90 tablet 0   metoprolol succinate (TOPROL-XL) 50 MG 24 hr tablet TAKE 1 TABLET BY MOUTH EVERY DAY WITH OR IMMEDIATELY FOLLOWING A MEAL 90 tablet 1   nitroGLYCERIN (NITROSTAT) 0.4 MG SL  tablet Place 1 tablet (0.4 mg total) under the tongue every 5 (five) minutes as needed for chest pain. 25 tablet 12   omega-3 acid ethyl esters (LOVAZA) 1 g capsule TAKE 2 CAPSULES BY MOUTH TWICE A DAY 360 capsule 1   OVER THE COUNTER MEDICATION Apply 1 application topically daily as needed (pain). Theraworx Pain Cream     Pimavanserin Tartrate (NUPLAZID) 34 MG CAPS Samples of this drug were given to the patient, quantity 5, Lot Number [id number] Exp 05/2022 30 capsule 0   Pimavanserin Tartrate (NUPLAZID) 34 MG CAPS Take 1 capsule (34 mg total) by mouth daily. 90 capsule 1   polyethylene glycol (MIRALAX / GLYCOLAX) packet Take 8.5 g by mouth daily.     rosuvastatin (CRESTOR) 20 MG tablet TAKE 1 TABLET BY MOUTH EVERYDAY AT BEDTIME 90 tablet 1   tirzepatide (MOUNJARO) 5 MG/0.5ML Pen Inject 5 mg into the skin once a week. 6 mL 1   traMADol (ULTRAM) 50 MG tablet TAKE 1 TABLET BY MOUTH TWICE A DAY 60 tablet 2   vitamin B-12 (CYANOCOBALAMIN) 1000 MCG tablet Take 1,000 mcg by mouth daily.     No current facility-administered medications for this visit.     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)   DM (diabetes mellitus) ([place])    TYPE 2 - insulin pump   Dysrhythmia    a-fib   GERD (gastroesophageal reflux disease)    Glaucoma    right eye   History of kidney stones    multiple   HLD (hyperlipidemia)    HTN (hypertension)    pt denies 08/19/12   Hyperplasia, prostate    Hypothyroidism    MI (myocardial infarction) ([place])    Dr Inocente Krach 2000, x3vessels bypass   OSA (obstructive sleep apnea)    AHI-28,on CPAP, noncompliant with CPAP   Parkinson  disease    1999   PONV (postoperative nausea and vomiting)    Restless legs    Shortness of breath    Hx: of at all times   UTI (lower urinary tract infection) 09/15/12   Klebsiella   Ventral hernia    Walker as ambulation aid    also uses wheelchair at home/when going out    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   BIOPSY  10/14/2018   Procedure: BIOPSY;  Surgeon: Jerene Bears, MD;  Location: [facility];  Service: Gastroenterology;;   CARDIAC CATHETERIZATION     CATARACT EXTRACTION W/ INTRAOCULAR LENS IMPLANT     Hx: of right eye   CATARACT EXTRACTION W/ INTRAOCULAR LENS IMPLANT Left 2018   COLONOSCOPY N/A 10/13/2014   Procedure: COLONOSCOPY;  Surgeon: Jerene Bears, MD;  Location: [facility];  Service: Gastroenterology;  Laterality: N/A;   COLONOSCOPY W/ BIOPSIES AND POLYPECTOMY     Hx: of   COLONOSCOPY WITH PROPOFOL N/A 10/14/2018   Procedure: COLONOSCOPY WITH PROPOFOL;  Surgeon: Jerene Bears, MD;  Location: [facility];  Service: Gastroenterology;  Laterality: N/A;   CORONARY ARTERY BYPASS GRAFT  2000   [name], MD   CORONARY STENT PLACEMENT  1998   DEEP BRAIN STIMULATOR PLACEMENT  2004   Right and left VIN stimulator placement (parkinsons)   EYE SURGERY     FINGER AMPUTATION     left pointer   IMPLANTABLE CARDIOVERTER DEFIBRILLATOR IMPLANT N/A 11/13/2013   Procedure: IMPLANTABLE CARDIOVERTER DEFIBRILLATOR IMPLANT;  Surgeon: Evans Lance, MD;  Location: [facility] C[facility] LAB;  Service: Cardiovascular;  Laterality: N/A;   INSERT / REPLACE / REMOVE PACEMAKER     medtronic   LEFT AND RIGHT HEART CATHETERIZATION WITH CORONARY ANGIOGRAM N/A 09/24/2013   Procedure: LEFT AND RIGHT HEART CATHETERIZATION WITH CORONARY ANGIOGRAM;  Surgeon: Burnell Blanks, MD;  Location: [facility] CATH LAB;  Service: Cardiovascular;  Laterality: N/A;   LITHOTRIPSY     3 different times   MEDIAN STERNOTOMY  2000   POLYPECTOMY   10/14/2018   Procedure: POLYPECTOMY;  Surgeon: Jerene Bears, MD;  Location: [facility];  Service: Gastroenterology;;   PULSE GENERATOR IMPLANT Right 11/13/2017   Procedure: Right chest implantable pulse generator change;  Surgeon: Erline Levine, MD;  Location: [place];  Service: Neurosurgery;  Laterality: Right;  Right chest implantable pulse generator change   SUBTHALAMIC STIMULATOR BATTERY REPLACEMENT N/A 09/05/2012   Procedure: Deep brain stimulator battery change;  Surgeon: Erline Levine, MD;  Location: [place];  Service: Neurosurgery;  Laterality: N/A;  Deep brain stimulator battery change   SUBTHALAMIC STIMULATOR BATTERY REPLACEMENT N/A 06/10/2015   Procedure: Deep Brain stimulator battery change;  Surgeon: Erline Levine, MD;  Location: [place] [facility];  Service: Neurosurgery;  Laterality: N/A;   SUBTHALAMIC STIMULATOR BATTERY REPLACEMENT Right 09/25/2019   Procedure: Deep brain stimulator battery change;  Surgeon: Erline Levine, MD;  Location: [place];  Service: Neurosurgery;  Laterality: Right;   TONSILLECTOMY      Social History   Socioeconomic History   Marital status: Married    Spouse name: [name]   Number of children: 2   Years of education: 12   Highest education level: High school graduate  Occupational History   Occupation: DISABLED    Comment: [employer], [employer]  Tobacco Use   Smoking status: Never    Passive exposure: Never   Smokeless tobacco: Never  Vaping Use   Vaping Use: Never used  Substance and Sexual Activity   Alcohol use: Not Currently   Drug use: No   Sexual activity: Not Currently  Other Topics Concern   Not on file  Social History Narrative   From [place]   Retired/disability Clinical research associate   Likes to fish.     Married 1972   3 kids   [place] fan   Social Determinants of Health  Financial Resource Strain: Low Risk  (12/23/2020)   Overall Financial Resource Strain (CARDIA)    Difficulty of Paying Living Expenses: Not hard at  all  Food Insecurity: No Food Insecurity (10/05/2021)   Hunger Vital Sign    Worried About Running Out of Food in the Last Year: Never true    Ran Out of Food in the Last Year: Never true  Transportation Needs: No Transportation Needs (10/05/2021)   PRAPARE - Hydrologist (Medical): No    Lack of Transportation (Non-Medical): No  Physical Activity: Insufficiently Active (12/23/2020)   Exercise Vital Sign    Days of Exercise per Week: 3 days    Minutes of Exercise per Session: 10 min  Stress: No Stress Concern Present (12/23/2020)   [place]    Feeling of Stress : Not at all  Social Connections: [place] (12/23/2020)   Social Connection and Isolation Panel [NHANES]    Frequency of Communication with Friends and Family: Twice a week    Frequency of Social Gatherings with Friends and Family: More than three times a week    Attends Religious Services: More than 4 times per year    Active Member of [employer] or Organizations: Yes    Attends Music therapist: More than 4 times per year    Marital Status: Married  Human resources officer Violence: Not At Risk (12/23/2020)   Humiliation, Afraid, Rape, and Kick questionnaire    Fear of Current or Ex-Partner: No    Emotionally Abused: No    Physically Abused: No    Sexually Abused: No    Family History  Problem Relation Age of Onset   Aneurysm Mother    Alcoholism Father    HIV/AIDS Brother 20       AIDS   Healthy Sister    Healthy Child    Peripheral vascular disease Other    Arthritis Other    Healthy Sister    Healthy Child    Healthy Child    Diabetes Neg Hx    Heart disease Neg Hx    Colon cancer Neg Hx    Prostate cancer Neg Hx     ROS: no fevers or chills, productive cough, hemoptysis, dysphasia, odynophagia, melena, hematochezia, dysuria, hematuria, rash, seizure activity, orthopnea, PND, pedal edema,  claudication. Remaining systems are negative.  Physical Exam: Well-developed well-nourished in no acute distress.  Skin is warm and dry.  HEENT is normal.  Neck is supple.  Chest is clear to auscultation with normal expansion.  Cardiovascular exam is regular rate and rhythm.  Abdominal exam nontender or distended. No masses palpated. Extremities show trace edema. neuro grossly intact  ECG-normal sinus rhythm, normal axis, cannot rule out anterior infarct, nonspecific ST changes, significant artifact.  [place] reviewed  A/P  1 coronary artery disease-patient doing well with no chest pain.  Continue aspirin and statin.  2 paroxysmal atrial fibrillation-we have discontinued his anticoagulation previously due to recurrent falls/Parkinson's.  I feel the risk outweighs the benefit.  Continue beta-blocker for rate control if atrial fibrillation recurs.  3 ischemic cardiomyopathy-continue beta-blocker.  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  We have therefore not been aggressive and advancing his medical regimen.  Will continue low-dose losartan.  4 hypertension-blood pressure controlled. Continue present medications.  We are allowing his blood pressure to run slightly higher in the setting of orthostasis/autonomic dysfunction.  5 ICD-Per EP.  6 chronic combined systolic/diastolic congestive heart failure-patient previously did not tolerate [place].  He has increased CHF symptoms which has improved with higher dose Lasix.  We will continue 80 mg twice daily.  Check potassium, renal function and BNP.  Repeat echocardiogram.  7 hyperlipidemia-continue statin.  Kirk Ruths, MD

## 2021-12-20 ENCOUNTER — Encounter: Payer: Self-pay | Admitting: Cardiology

## 2021-12-20 ENCOUNTER — Ambulatory Visit: Payer: Medicare Other | Attending: Cardiology | Admitting: Cardiology

## 2021-12-20 VITALS — BP 124/72 | HR 89 | Ht 68.0 in | Wt 266.8 lb

## 2021-12-20 DIAGNOSIS — I2510 Atherosclerotic heart disease of native coronary artery without angina pectoris: Secondary | ICD-10-CM | POA: Diagnosis not present

## 2021-12-20 DIAGNOSIS — I10 Essential (primary) hypertension: Secondary | ICD-10-CM | POA: Insufficient documentation

## 2021-12-20 DIAGNOSIS — I255 Ischemic cardiomyopathy: Secondary | ICD-10-CM | POA: Insufficient documentation

## 2021-12-20 DIAGNOSIS — I480 Paroxysmal atrial fibrillation: Secondary | ICD-10-CM | POA: Diagnosis not present

## 2021-12-20 DIAGNOSIS — Z95810 Presence of automatic (implantable) cardiac defibrillator: Secondary | ICD-10-CM | POA: Diagnosis not present

## 2021-12-20 DIAGNOSIS — I5022 Chronic systolic (congestive) heart failure: Secondary | ICD-10-CM | POA: Insufficient documentation

## 2021-12-20 NOTE — Patient Instructions (Signed)
  Testing/Procedures: Your physician has requested that you have an echocardiogram. Echocardiography is a painless test that uses sound waves to create images of your heart. It provides your doctor with information about the size and shape of your heart and how well your heart's chambers and valves are working. This procedure takes approximately one hour. There are no restrictions for this procedure. Please do NOT wear cologne, perfume, aftershave, or lotions (deodorant is allowed). Please arrive 15 minutes prior to your appointment time. [place], you and your health needs are our priority.  As part of our continuing mission to provide you with exceptional heart care, we have created designated Provider Care Teams.  These Care Teams include your primary Cardiologist (physician) and Advanced Practice Providers (APPs -  Physician Assistants and Nurse Practitioners) who all work together to provide you with the care you need, when you need it.  We recommend signing up for the patient portal called "MyChart".  Sign up information is provided on this After Visit Summary.  MyChart is used to connect with patients for Virtual Visits (Telemedicine).  Patients are able to view lab/test results, encounter notes, upcoming appointments, etc.  Non-urgent messages can be sent to your provider as well.   To learn more about what you can do with MyChart, go to [email or web address].    Your next appointment:   6 week(s)  The format for your next appointment:   In Person  Provider:   ANY APP    Then, Kirk Ruths, MD will plan to see you again in 6 month(s).

## 2021-12-21 ENCOUNTER — Other Ambulatory Visit (HOSPITAL_COMMUNITY): Payer: Self-pay

## 2021-12-21 LAB — BASIC METABOLIC PANEL
BUN/Creatinine Ratio: 15 (ref 10–24)
BUN: 18 mg/dL (ref 8–27)
CO2: 31 mmol/L — ABNORMAL HIGH (ref 20–29)
Calcium: 9.2 mg/dL (ref 8.6–10.2)
Chloride: 99 mmol/L (ref 96–106)
Creatinine, Ser: 1.2 mg/dL (ref 0.76–1.27)
Glucose: 122 mg/dL — ABNORMAL HIGH (ref 70–99)
Potassium: 4.4 mmol/L (ref 3.5–5.2)
Sodium: 143 mmol/L (ref 134–144)
eGFR: 65 mL/min/{1.73_m2} (ref >59)

## 2021-12-21 LAB — PRO B NATRIURETIC PEPTIDE: NT-Pro BNP: 86 pg/mL (ref 0–376)

## 2021-12-26 ENCOUNTER — Other Ambulatory Visit (HOSPITAL_COMMUNITY): Payer: Self-pay

## 2021-12-26 ENCOUNTER — Telehealth: Payer: Self-pay

## 2021-12-26 NOTE — Telephone Encounter (Signed)
Patient Advocate Encounter  Prior Authorization for Nuplazid '34MG'$  capsules has been approved through [employer].    Effective: 11-22-2021 to 06-20-2022  This can be filled at the following pharmacies:  - [facility]

## 2021-12-27 ENCOUNTER — Encounter: Payer: Self-pay | Admitting: Endocrinology

## 2021-12-27 ENCOUNTER — Ambulatory Visit (INDEPENDENT_AMBULATORY_CARE_PROVIDER_SITE_OTHER): Payer: Medicare Other | Admitting: Endocrinology

## 2021-12-27 VITALS — BP 108/70 | HR 68 | Ht 68.0 in | Wt 264.0 lb

## 2021-12-27 DIAGNOSIS — E1165 Type 2 diabetes mellitus with hyperglycemia: Secondary | ICD-10-CM

## 2021-12-27 DIAGNOSIS — E782 Mixed hyperlipidemia: Secondary | ICD-10-CM | POA: Diagnosis not present

## 2021-12-27 DIAGNOSIS — E890 Postprocedural hypothyroidism: Secondary | ICD-10-CM | POA: Diagnosis not present

## 2021-12-27 DIAGNOSIS — I255 Ischemic cardiomyopathy: Secondary | ICD-10-CM

## 2021-12-27 DIAGNOSIS — Z794 Long term (current) use of insulin: Secondary | ICD-10-CM

## 2021-12-27 LAB — POCT GLYCOSYLATED HEMOGLOBIN (HGB A1C): Hemoglobin A1C: 6.3 % — AB (ref 4.0–5.6)

## 2021-12-27 LAB — LIPID PANEL
Cholesterol: 115 mg/dL (ref 0–200)
HDL: 36.2 mg/dL — ABNORMAL LOW (ref >39.00)
LDL Cholesterol: 53 mg/dL (ref 0–99)
NonHDL: 78.73
Total CHOL/HDL Ratio: 3
Triglycerides: 128 mg/dL (ref 0.0–149.0)
VLDL: 25.6 mg/dL (ref 0.0–40.0)

## 2021-12-27 LAB — TSH: TSH: 2.7 u[IU]/mL (ref 0.35–5.50)

## 2021-12-27 LAB — ALT: ALT: 5 U/L (ref 0–53)

## 2021-12-27 LAB — GLUCOSE, RANDOM: Glucose, Bld: 138 mg/dL — ABNORMAL HIGH (ref 70–99)

## 2021-12-27 NOTE — Progress Notes (Unsigned)
Patient ID: Henry [name], male   DOB: 06-12-1950, 71 y.o.   MRN: [id number]           Reason for Appointment: Follow-up for Type 2 Diabetes   History of Present Illness:          Diagnosis: Type 2 diabetes mellitus, date of diagnosis: 2000       Recent history:    INSULIN regimen: Omnipod insulin pump regional with Humalog insulin   Basal rate programs: Midnight = 1.5, 7 AM = 1.9, 11 a.m. = 2.5, total 51 units  Boluses 3-5 units for breakfast, 4-6 lunch and 6-7 units for dinner, 2-3 units for snacks Sensitivity 1: 60 carb ratio 1:10, target 120  Non-insulin hypoglycemic drugs the patient is taking are: Ozempic 0.5 mg weekly  His A1c is better at 6.3, was 6.8 However recent GMI 6.9 on the Dexcom   Current management, blood sugar patterns and problems identified: He has taken Mounjaro instead of Ozempic since he was having some nausea with Ozempic This is tolerated well and has now moved up to 5 mg weekly since mid November Although his blood sugars are looking better and now being assessed better with the Dexcom system he still has high readings after dinner Review of his boluses indicate that he is not entering the appropriate amounts of about 65 g carbs at dinnertime but even when blood sugars are slightly high his pump is at the subtracting some insulin on board generally getting about 5 units bolus only  Again his wife is helping him with pump management His phone does not have the Dexcom app and he is not able to use the OmniPod 5 system  Usually trying to bolus before starting to eat as directed  Usually getting 45-65 g of carbohydrate for his meals, higher intake at dinnertime Again getting 3 meals a day and only occasionally snacks As before he is trying to bolus at the time of his meals before starting to eat Has been unable to exercise as before   He has seen the dietitian in 04/2017  Not able to exercise because of back pain and difficulty walking        Side effects from medications have been: None Compliance with the medical regimen: Fair   Interpretation of the Dexcom download from the last 2 weeks is as follows  Blood sugars are mildly increased throughout the day but generally rising in the evenings and just above 180 target between 7 PM-11 PM OVERNIGHT blood sugars are averaging 132 at midnight and as low as 114 at 2 AM with sometimes low normal sugars in the 60s; blood sugars generally rising after about 3 AM until his breakfast time around 8-9 AM Premeal blood sugars at breakfast time is usually about 150 POSTPRANDIAL readings tend to rise mostly after evening meal above the target and occasionally after lunch/midday Highest average blood sugar at night is 187-194 between 7 PM-11 PM No other hypoglycemia seen  CGM use % of time 100  2-week average/GV 152/21  Time in range       82%  % Time Above 180 18  % Time above 250   % Time Below 70 0     Glucose previous data :  PRE-MEAL Fasting Lunch Dinner Bedtime Overall  Glucose range: 98-143  132-171    Mean/median:     141    Meals: 9 am , 2-3 pm and 6-7 pm  Dietician visit, most recent: 4/19               Weight history:   Wt Readings from Last 3 Encounters:  12/28/21 264 lb (119.7 kg)  12/27/21 264 lb (119.7 kg)  12/20/21 266 lb 12.8 oz (121 kg)    Glycemic control:   Lab Results  Component Value Date   HGBA1C 6.3 (A) 12/27/2021   HGBA1C 6.8 (H) 08/31/2021   HGBA1C 6.6 (H) 05/31/2021   Lab Results  Component Value Date   MICROALBUR <0.7 02/04/2021   LDLCALC 53 12/27/2021   CREATININE 1.20 12/20/2021    Lab Results  Component Value Date   FRUCTOSAMINE 359 (H) 07/01/2019   FRUCTOSAMINE 277 11/09/2017   FRUCTOSAMINE 350 (H) 03/29/2017    Past history:   He has been taking Amaryl for several years and probably metformin since onset also At some point he was changed from metformin to [place] and was taking this since at least 2012 A1c had  been higher in 2015 Since his A1c had been progressively higher with his regimen of Janumet and Amaryl he was started on Victoza in 01/2014 He was started on Lantus insulin in 5/16 because of persistent hyperglycemia especially fasting; was having readings as high as 293 Because of tendency to high postprandial readings and for control with Lantus he was switched to the V-go pump in  09/2014   Other active problems: See review of systems    Allergies as of 12/27/2021       Reactions   Penicillins Anaphylaxis, Rash   Because of a history of documented adverse serious drug reaction;Medi Alert bracelet  is recommended PATIENT HAS HAD A PCN REACTION WITH IMMEDIATE RASH, FACIAL/TONGUE/THROAT SWELLING, SOB, OR LIGHTHEADEDNESS WITH HYPOTENSION:  #  #  YES  #  #  Has patient had a PCN reaction causing severe rash involving mucus membranes or skin necrosis: unknown Has patient had a PCN reaction that required hospitalization NO Has patient had a PCN reaction occurring within the last 10 years: NO   Klonopin [clonazepam] Other (See Comments)   agitation   Peanut-containing Drug Products Cough   Watermelon [citrullus Vulgaris] Other (See Comments), Cough   Tickle in throat        Medication List        Accurate as of December 27, 2021 11:59 PM. If you have any questions, ask your nurse or doctor.          acetaminophen 500 MG tablet Commonly known as: TYLENOL Take 1,000 mg by mouth at bedtime as needed for mild pain or headache.   albuterol 108 (90 Base) MCG/ACT inhaler Commonly known as: VENTOLIN HFA Inhale 2 puffs into the lungs every 6 (six) hours as needed for wheezing or shortness of breath.   allopurinol 100 MG tablet Commonly known as: ZYLOPRIM TAKE 1 & 1/2 TABLETS BY MOUTH DAILY   aspirin EC 81 MG tablet Take 1 tablet (81 mg total) by mouth daily. Swallow whole.   carbidopa-levodopa 50-200 MG tablet Commonly known as: SINEMET CR TAKE 1 TABLET BY MOUTH EVERYDAY AT  BEDTIME   Carbidopa-Levodopa ER 25-100 MG tablet controlled release Commonly known as: SINEMET CR TAKE 2 TABLETS BY MOUTH 5 TIMES PER DAY AS DIRECTED   clotrimazole 1 % cream Commonly known as: LOTRIMIN Apply 1 Application topically 2 (two) times daily.   colchicine 0.6 MG tablet Take 1 tablet (0.6 mg total) by mouth daily as needed (gout).   cyanocobalamin 1000 MCG tablet Commonly  known as: VITAMIN B12 Take 1,000 mcg by mouth daily.   Dexcom G6 Receiver Devi Use to check blood sugar daily   Dexcom G6 Sensor Misc Use to check blood sugar daily   Dexcom G6 Transmitter Misc Change every 3 months   dorzolamide-timolol 2-0.5 % ophthalmic solution Commonly known as: COSOPT Place 1 drop into the right eye 2 (two) times daily.   DULoxetine 30 MG capsule Commonly known as: CYMBALTA TAKE 1 CAPSULE BY MOUTH EVERY DAY   fluticasone-salmeterol 250-50 MCG/ACT Aepb Commonly known as: ADVAIR Inhale 1 puff into the lungs in the morning and at bedtime. Rinse after use.   FREESTYLE LITE test strip Generic drug: glucose blood USE TO CHECK BLOOD SUGAR 4 TIMES DAILY.   furosemide 80 MG tablet Commonly known as: LASIX Take 1 tablet (80 mg total) by mouth daily. With extra tab a day if AM weight is above 263 lbs.   HumaLOG 100 UNIT/ML injection Generic drug: insulin lispro USE MAXIMUM 76 UNITS PER   DAY WITH V-GO PUMP   Klor-Con M20 20 MEQ tablet Generic drug: potassium chloride SA TAKE 3 TABLETS (60 MEQ) BY MOUTH DAILY.   levothyroxine 137 MCG tablet Commonly known as: SYNTHROID Take 1 tab daily with 1 extra tablet one day a week   losartan 25 MG tablet Commonly known as: COZAAR Take 1 tablet (25 mg total) by mouth daily. Please keep scheduled appointment   metoprolol succinate 50 MG 24 hr tablet Commonly known as: TOPROL-XL TAKE 1 TABLET BY MOUTH EVERY DAY WITH OR IMMEDIATELY FOLLOWING A MEAL   nitroGLYCERIN 0.4 MG SL tablet Commonly known as: NITROSTAT Place 1 tablet  (0.4 mg total) under the tongue every 5 (five) minutes as needed for chest pain.   Nuplazid 34 MG Caps Generic drug: Pimavanserin Tartrate Samples of this drug were given to the patient, quantity 5, Lot Number [id number] Exp 05/2022   Nuplazid 34 MG Caps Generic drug: Pimavanserin Tartrate Take 1 capsule (34 mg total) by mouth daily.   omega-3 acid ethyl esters 1 g capsule Commonly known as: LOVAZA TAKE 2 CAPSULES BY MOUTH TWICE A DAY   Omnipod 5 G6 Intro (Gen 5) Kit 1 kit by Does not apply route daily.   Omnipod 5 G6 Pod (Gen 5) Misc CHANGE POD EVERY 3 DAYS   OVER THE COUNTER MEDICATION Apply 1 application topically daily as needed (pain). Theraworx Pain Cream   polyethylene glycol 17 g packet Commonly known as: MIRALAX / GLYCOLAX Take 8.5 g by mouth daily.   rosuvastatin 20 MG tablet Commonly known as: CRESTOR TAKE 1 TABLET BY MOUTH EVERYDAY AT BEDTIME   tirzepatide 5 MG/0.5ML Pen Commonly known as: MOUNJARO Inject 5 mg into the skin once a week.   traMADol 50 MG tablet Commonly known as: ULTRAM TAKE 1 TABLET BY MOUTH TWICE A DAY   Vitamin D 50 MCG (2000 UT) tablet Take 2,000 Units by mouth daily.        Allergies:  Allergies  Allergen Reactions   Penicillins Anaphylaxis and Rash    Because of a history of documented adverse serious drug reaction;Medi Alert bracelet  is recommended PATIENT HAS HAD A PCN REACTION WITH IMMEDIATE RASH, FACIAL/TONGUE/THROAT SWELLING, SOB, OR LIGHTHEADEDNESS WITH HYPOTENSION:  #  #  YES  #  #  Has patient had a PCN reaction causing severe rash involving mucus membranes or skin necrosis: unknown Has patient had a PCN reaction that required hospitalization NO Has patient had a PCN reaction occurring within  the last 10 years: NO   Klonopin [Clonazepam] Other (See Comments)    agitation   Peanut-Containing Drug Products Cough   Watermelon [Citrullus Vulgaris] Other (See Comments) and Cough    Tickle in throat    Past Medical  History:  Diagnosis Date   AICD (automatic cardioverter/defibrillator) present    Dr Lovena Le office visit yearly, MDT  medtronic    Arthritis    cane   Asthma    CAD (coronary artery disease)    Cardiomyopathy    Cataract    removed   Complication of anesthesia    pt states that he got a rash   Constipation    Deaf    right ear, 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)   DM (diabetes mellitus) ([place])    TYPE 2 - insulin pump   Dysrhythmia    a-fib   GERD (gastroesophageal reflux disease)    Glaucoma    right eye   History of kidney stones    multiple   HLD (hyperlipidemia)    HTN (hypertension)    pt denies 08/19/12   Hyperplasia, prostate    Hypothyroidism    MI (myocardial infarction) ([place])    Dr Crenshaw 2000, x3vessels bypass   OSA (obstructive sleep apnea)    AHI-28,on CPAP, noncompliant with CPAP   Parkinson disease    1999   PONV (postoperative nausea and vomiting)    Restless legs    Shortness of breath    Hx: of at all times   UTI (lower urinary tract infection) 09/15/12   Klebsiella   Ventral hernia    Walker as ambulation aid    also uses wheelchair at home/when going out    Past Surgical History:  Procedure Laterality Date   [place]   right total loss   BIOPSY  10/14/2018   Procedure: BIOPSY;  Surgeon: Jerene Bears, MD;  Location: [facility];  Service: Gastroenterology;;   CARDIAC CATHETERIZATION     CATARACT EXTRACTION W/ INTRAOCULAR LENS IMPLANT     Hx: of right eye   CATARACT EXTRACTION W/ INTRAOCULAR LENS IMPLANT Left 2018   COLONOSCOPY N/A 10/13/2014   Procedure: COLONOSCOPY;  Surgeon: Jerene Bears, MD;  Location: [facility];  Service: Gastroenterology;  Laterality: N/A;   COLONOSCOPY W/ BIOPSIES AND POLYPECTOMY     Hx: of   COLONOSCOPY WITH PROPOFOL N/A 10/14/2018   Procedure: COLONOSCOPY WITH PROPOFOL;  Surgeon: Jerene Bears, MD;  Location: [facility];  Service: Gastroenterology;  Laterality: N/A;   CORONARY  ARTERY BYPASS GRAFT  2000   [name], MD   CORONARY STENT PLACEMENT  1998   DEEP BRAIN STIMULATOR PLACEMENT  2004   Right and left VIN stimulator placement (parkinsons)   EYE SURGERY     FINGER AMPUTATION     left pointer   IMPLANTABLE CARDIOVERTER DEFIBRILLATOR IMPLANT N/A 11/13/2013   Procedure: IMPLANTABLE CARDIOVERTER DEFIBRILLATOR IMPLANT;  Surgeon: Evans Lance, MD;  Location: [facility] CATH LAB;  Service: Cardiovascular;  Laterality: N/A;   INSERT / REPLACE / REMOVE PACEMAKER     medtronic   LEFT AND RIGHT HEART CATHETERIZATION WITH CORONARY ANGIOGRAM N/A 09/24/2013   Procedure: LEFT AND RIGHT HEART CATHETERIZATION WITH CORONARY ANGIOGRAM;  Surgeon: Burnell Blanks, MD;  Location: [facility] CATH LAB;  Service: Cardiovascular;  Laterality: N/A;   LITHOTRIPSY     3 different times   MEDIAN STERNOTOMY  2000   POLYPECTOMY  10/14/2018   Procedure: POLYPECTOMY;  Surgeon: Jerene Bears, MD;  Location: [facility];  Service: Gastroenterology;;   PULSE GENERATOR IMPLANT Right 11/13/2017   Procedure: Right chest implantable pulse generator change;  Surgeon: Erline Levine, MD;  Location: [place];  Service: Neurosurgery;  Laterality: Right;  Right chest implantable pulse generator change   SUBTHALAMIC STIMULATOR BATTERY REPLACEMENT N/A 09/05/2012   Procedure: Deep brain stimulator battery change;  Surgeon: Erline Levine, MD;  Location: [facility];  Service: Neurosurgery;  Laterality: N/A;  Deep brain stimulator battery change   SUBTHALAMIC STIMULATOR BATTERY REPLACEMENT N/A 06/10/2015   Procedure: Deep Brain stimulator battery change;  Surgeon: Erline Levine, MD;  Location: [place] [facility];  Service: Neurosurgery;  Laterality: N/A;   SUBTHALAMIC STIMULATOR BATTERY REPLACEMENT Right 09/25/2019   Procedure: Deep brain stimulator battery change;  Surgeon: Erline Levine, MD;  Location: [place];  Service: Neurosurgery;  Laterality: Right;   TONSILLECTOMY      Family History  Problem Relation Age of Onset    Aneurysm Mother    Alcoholism Father    HIV/AIDS Brother 50       AIDS   Healthy Sister    Healthy Child    Peripheral vascular disease Other    Arthritis Other    Healthy Sister    Healthy Child    Healthy Child    Diabetes Neg Hx    Heart disease Neg Hx    Colon cancer Neg Hx    Prostate cancer Neg Hx     Social History:  reports that he has never smoked. He has never been exposed to tobacco smoke. He has never used smokeless tobacco. He reports that he does not currently use alcohol. He reports that he does not use drugs.    Review of Systems     HYPERLIPIDEMIA:  He is on Crestor 20 mg with good control of LDL, has low HDL Triglycerides are last at 150, previously over 200, treated with Lovaza 2 g twice a day HDL has improved      Lab Results  Component Value Date   CHOL 115 12/27/2021   CHOL 103 05/31/2021   CHOL 110 06/24/2020   Lab Results  Component Value Date   HDL 36.20 (L) 12/27/2021   HDL 29.10 (L) 05/31/2021   HDL 32.30 (L) 06/24/2020   Lab Results  Component Value Date   LDLCALC 53 12/27/2021   LDLCALC 39 05/31/2021   LDLCALC 47 06/24/2020   Lab Results  Component Value Date   TRIG 128.0 12/27/2021   TRIG 172.0 (H) 05/31/2021   TRIG 150.0 (H) 06/24/2020   Lab Results  Component Value Date   CHOLHDL 3 12/27/2021   CHOLHDL 4 05/31/2021   CHOLHDL 3 06/24/2020   Lab Results  Component Value Date   LDLDIRECT 51.0 04/24/2019   LDLDIRECT 54.0 09/18/2018   LDLDIRECT 67.0 03/12/2018              Lab Results  Component Value Date   ALT 5 12/27/2021        HYPOTHYROIDISM:   He has had hypothyroidism following radioactive iodine treatment for hyperthyroidism in 2013  Last TSH has been normal with taking 137 mcg levothyroxine, 8 tablets a week   Lab Results  Component Value Date   TSH 2.70 12/27/2021   TSH 5.58 (H) 08/31/2021   TSH 2.21 05/31/2021   FREET4 0.77 04/24/2019   FREET4 0.88 08/16/2018   FREET4 0.75 03/12/2018   Has  chronic systolic CHF Prescribed 80 mg Lasix daily  without metolazone  Blood pressure history:  BP Readings from Last 3 Encounters:  12/27/21 108/70  12/20/21 124/72  12/16/21 120/64    CKD: Has variable creatinine levels as follows: Has been followed by nephrologist, Wilder Glade was stopped when he had UTI in 5/22 No   microalbuminuria   He was prescribed LOSARTAN 25 mg by his cardiologist but he thinks he had abdominal pain for a week with this and has not taken it  Lab Results  Component Value Date   CREATININE 1.20 12/20/2021   CREATININE 1.29 11/25/2021   CREATININE 1.46 08/31/2021    Neuropathy: Taking gabapentin bid from his PCP. Has history of numbness in his toes  Diabetic foot exam in 9/22   Physical Examination:  BP 108/70   Pulse 68   Ht _0  (1.727 m)   Wt 264 lb (119.7 kg)   SpO2 97%   BMI 40.14 kg/m      ASSESSMENT/PLAN  Diabetes type 2, insulin-dependent with obesity     See history of present illness for detailed discussion of his current management, blood sugar patterns and problems identified  His A1c is consistently near the normal range and now improved at 6.3  He is using the Omni pod 5 insulin pump but not in the automated mode Also on Mounjaro instead of Ozempic for at least 2 months As discussed above his blood sugars are relatively higher during the day and also rising excessively after dinner His pump is somehow not allowing full boluses at dinnertime and subtracting insulin on board even though his previous boluses have been more than 4 hours prior Recent GMI 6.9 Considering various factors his blood sugar control is still very good  Recommendations: Continue 5 mg Mounjaro instead of Ozempic Needs higher basal rate during the day and slightly lower basal rate until 3 AM at night Pump settings were changed as follows  Active insulin time is 2 hours New basal rate settings: 12 AM = 1.3, 3:30 AM = 1.6, 6 AM = 2.0, 12 PM = 2 point 5  and 10 PM = 2.2 Try to bolus before starting to eat consistently based on previous instructions but may also adjust boluses 1 to 2 units based on meal size  HYPOTHYROIDISM: TSH slightly above normal on the last visit and will need follow-up labs since he is now taking 8 tablets a week of the 137  Mixed Hyperlipidemia: Will need follow-up labs today although he is not fasting Currently on Crestor and Lovaza    There are no Patient Instructions on file for this visit.   Follow-up in 4 months  Addendum: All labs normal including lipids and thyroid and he will continue same regimen  Elayne Snare 12/28/2021, 8:39 PM   Note: This office note was prepared with Dragon voice recognition system technology. Any transcriptional errors that result from this process are unintentional.

## 2021-12-28 ENCOUNTER — Ambulatory Visit (INDEPENDENT_AMBULATORY_CARE_PROVIDER_SITE_OTHER): Payer: Medicare Other

## 2021-12-28 VITALS — Ht 68.0 in | Wt 264.0 lb

## 2021-12-28 DIAGNOSIS — Z0000 Encounter for general adult medical examination without abnormal findings: Secondary | ICD-10-CM | POA: Diagnosis not present

## 2021-12-28 NOTE — Progress Notes (Signed)
Subjective:   Henry [name] is a 71 y.o. male who presents for Medicare Annual/Subsequent preventive examination.  Review of Systems    Virtual Visit via Telephone Note  I connected with  Henry [name] on 12/28/21 at 12:30 PM EST by telephone and verified that I am speaking with the correct person using two identifiers.  Location: Patient: Home Provider: Office Persons participating in the virtual visit: patient/Nurse Health Advisor   I discussed the limitations, risks, security and privacy concerns of performing an evaluation and management service by telephone and the availability of in person appointments. The patient expressed understanding and agreed to proceed.  Interactive audio and video telecommunications were attempted between this nurse and patient, however failed, due to patient having technical difficulties OR patient did not have access to video capability.  We continued and completed visit with audio only.  Some vital signs may be absent or patient reported.   Criselda Peaches, LPN  Cardiac Risk Factors include: advanced age (>75mn, >>25women);diabetes mellitus;male gender;hypertension     Objective:    Today's Vitals   12/28/21 1235  Weight: 264 lb (119.7 kg)  Height: _0  (1.727 m)   Body mass index is 40.14 kg/m.     12/28/2021   12:48 PM 12/05/2021   12:46 PM 06/07/2021    3:56 PM 05/31/2021    9:34 AM 12/23/2020   11:21 AM 11/30/2020    9:30 AM 07/20/2020   10:00 PM  Advanced Directives  Does Patient Have a Medical Advance Directive? _1  Yes No  Type of AParamedicof [place]Living will Living will [place] will;Out of facility DNR (pink MOST or yellow form) [place] will;Out of facility DNR (pink MOST or yellow form) HCity of the SunLiving will    Does patient want to make changes to medical advance directive?     Yes  (MAU/Ambulatory/Procedural Areas - Information given)    Copy of [place] Chart? No - copy requested        Would patient like information on creating a medical advance directive?       No - Patient declined    Current Medications (verified) Outpatient Encounter Medications as of 12/28/2021  Medication Sig   acetaminophen (TYLENOL) 500 MG tablet Take 1,000 mg by mouth at bedtime as needed for mild pain or headache.    albuterol (VENTOLIN HFA) 108 (90 Base) MCG/ACT inhaler Inhale 2 puffs into the lungs every 6 (six) hours as needed for wheezing or shortness of breath.   allopurinol (ZYLOPRIM) 100 MG tablet TAKE 1 & 1/2 TABLETS BY MOUTH DAILY   aspirin EC 81 MG tablet Take 1 tablet (81 mg total) by mouth daily. Swallow whole.   carbidopa-levodopa (SINEMET CR) 50-200 MG tablet TAKE 1 TABLET BY MOUTH EVERYDAY AT BEDTIME   Carbidopa-Levodopa ER (SINEMET CR) 25-100 MG tablet controlled release TAKE 2 TABLETS BY MOUTH 5 TIMES PER DAY AS DIRECTED   Cholecalciferol (VITAMIN D) 50 MCG (2000 UT) tablet Take 2,000 Units by mouth daily.    clotrimazole (LOTRIMIN) 1 % cream Apply 1 Application topically 2 (two) times daily.   colchicine 0.6 MG tablet Take 1 tablet (0.6 mg total) by mouth daily as needed (gout).   Continuous Blood Gluc Receiver (DEXCOM G6 RECEIVER) DEVI Use to check blood sugar daily   Continuous Blood Gluc Sensor (DEXCOM G6 SENSOR) MISC Use to check blood sugar daily  Continuous Blood Gluc Transmit (DEXCOM G6 TRANSMITTER) MISC Change every 3 months   dorzolamide-timolol (COSOPT) 22.3-6.8 MG/ML ophthalmic solution Place 1 drop into the right eye 2 (two) times daily.   DULoxetine (CYMBALTA) 30 MG capsule TAKE 1 CAPSULE BY MOUTH EVERY DAY   fluticasone-salmeterol (ADVAIR) 250-50 MCG/ACT AEPB Inhale 1 puff into the lungs in the morning and at bedtime. Rinse after use.   FREESTYLE LITE test strip USE TO CHECK BLOOD SUGAR 4 TIMES DAILY.   furosemide (LASIX) 80 MG tablet  Take 1 tablet (80 mg total) by mouth daily. With extra tab a day if AM weight is above 263 lbs.   HUMALOG 100 UNIT/ML injection USE MAXIMUM 76 UNITS PER   DAY WITH V-GO PUMP   Insulin Disposable Pump (OMNIPOD 5 G6 INTRO, GEN 5,) KIT 1 kit by Does not apply route daily.   Insulin Disposable Pump (OMNIPOD 5 G6 POD, GEN 5,) MISC CHANGE POD EVERY 3 DAYS   KLOR-CON M20 20 MEQ tablet TAKE 3 TABLETS (60 MEQ) BY MOUTH DAILY.   levothyroxine (SYNTHROID) 137 MCG tablet Take 1 tab daily with 1 extra tablet one day a week   losartan (COZAAR) 25 MG tablet Take 1 tablet (25 mg total) by mouth daily. Please keep scheduled appointment   metoprolol succinate (TOPROL-XL) 50 MG 24 hr tablet TAKE 1 TABLET BY MOUTH EVERY DAY WITH OR IMMEDIATELY FOLLOWING A MEAL   nitroGLYCERIN (NITROSTAT) 0.4 MG SL tablet Place 1 tablet (0.4 mg total) under the tongue every 5 (five) minutes as needed for chest pain.   omega-3 acid ethyl esters (LOVAZA) 1 g capsule TAKE 2 CAPSULES BY MOUTH TWICE A DAY   OVER THE COUNTER MEDICATION Apply 1 application topically daily as needed (pain). Theraworx Pain Cream   Pimavanserin Tartrate (NUPLAZID) 34 MG CAPS Samples of this drug were given to the patient, quantity 5, Lot Number [id number] Exp 05/2022   Pimavanserin Tartrate (NUPLAZID) 34 MG CAPS Take 1 capsule (34 mg total) by mouth daily.   polyethylene glycol (MIRALAX / GLYCOLAX) packet Take 8.5 g by mouth daily.   rosuvastatin (CRESTOR) 20 MG tablet TAKE 1 TABLET BY MOUTH EVERYDAY AT BEDTIME   tirzepatide (MOUNJARO) 5 MG/0.5ML Pen Inject 5 mg into the skin once a week.   traMADol (ULTRAM) 50 MG tablet TAKE 1 TABLET BY MOUTH TWICE A DAY   vitamin B-12 (CYANOCOBALAMIN) 1000 MCG tablet Take 1,000 mcg by mouth daily.   No facility-administered encounter medications on file as of 12/28/2021.    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   Dysrhythmia    a-fib   GERD (gastroesophageal reflux disease)    Glaucoma    right eye   History of kidney stones    multiple   HLD (hyperlipidemia)    HTN (hypertension)    pt denies 08/19/12   Hyperplasia, prostate  Hypothyroidism    MI (myocardial infarction) ([place])    Dr Stanford Breed 2000, x3vessels bypass   OSA (obstructive sleep apnea)    AHI-28,on CPAP, noncompliant with CPAP   Parkinson disease    1999   PONV (postoperative nausea and vomiting)    Restless legs    Shortness of breath    Hx: of at all times   UTI (lower urinary tract infection) 09/15/12   Klebsiella   Ventral hernia    Walker as ambulation aid    also uses wheelchair at home/when going out   Past Surgical History:  Procedure Laterality Date   [place]   right total loss   BIOPSY  10/14/2018   Procedure: BIOPSY;  Surgeon: Jerene Bears, MD;  Location: [facility];  Service: Gastroenterology;;   CARDIAC CATHETERIZATION     CATARACT EXTRACTION W/ INTRAOCULAR LENS IMPLANT     Hx: of right eye   CATARACT EXTRACTION W/ INTRAOCULAR LENS IMPLANT Left 2018   COLONOSCOPY N/A 10/13/2014   Procedure: COLONOSCOPY;  Surgeon: Jerene Bears, MD;  Location: [facility];  Service: Gastroenterology;  Laterality: N/A;   COLONOSCOPY W/ BIOPSIES AND POLYPECTOMY     Hx: of   COLONOSCOPY WITH PROPOFOL N/A 10/14/2018   Procedure: COLONOSCOPY WITH PROPOFOL;  Surgeon: Jerene Bears, MD;  Location: [facility];  Service: Gastroenterology;  Laterality: N/A;   CORONARY ARTERY  BYPASS GRAFT  2000   [name], MD   CORONARY STENT PLACEMENT  1998   DEEP BRAIN STIMULATOR PLACEMENT  2004   Right and left VIN stimulator placement (parkinsons)   EYE SURGERY     FINGER AMPUTATION     left pointer   IMPLANTABLE CARDIOVERTER DEFIBRILLATOR IMPLANT N/A 11/13/2013   Procedure: IMPLANTABLE CARDIOVERTER DEFIBRILLATOR IMPLANT;  Surgeon: Evans Lance, MD;  [name];  Service: Cardiovascular;  Laterality: N/A;   INSERT / REPLACE / REMOVE PACEMAKER     medtronic   LEFT AND RIGHT HEART CATHETERIZATION WITH CORONARY ANGIOGRAM N/A 09/24/2013   Procedure: LEFT AND RIGHT HEART CATHETERIZATION WITH CORONARY ANGIOGRAM;  Surgeon: Burnell Blanks, MD;  Location: [facility] C[facility] LAB;  Service: Cardiovascular;  Laterality: N/A;   LITHOTRIPSY     3 different times   MEDIAN STERNOTOMY  2000   POLYPECTOMY  10/14/2018   Procedure: POLYPECTOMY;  Surgeon: Jerene Bears, MD;  Location: [facility];  Service: Gastroenterology;;   PULSE GENERATOR IMPLANT Right 11/13/2017   Procedure: Right chest implantable pulse generator change;  Surgeon: Erline Levine, MD;  Location: [facility];  Service: Neurosurgery;  Laterality: Right;  Right chest implantable pulse generator change   SUBTHALAMIC STIMULATOR BATTERY REPLACEMENT N/A 09/05/2012   Procedure: Deep brain stimulator battery change;  Surgeon: Erline Levine, MD;  Location: [facility];  Service: Neurosurgery;  Laterality: N/A;  Deep brain stimulator battery change   SUBTHALAMIC STIMULATOR BATTERY REPLACEMENT N/A 06/10/2015   Procedure: Deep Brain stimulator battery change;  Surgeon: Erline Levine, MD;  Location: [place] [facility];  Service: Neurosurgery;  Laterality: N/A;   SUBTHALAMIC STIMULATOR BATTERY REPLACEMENT Right 09/25/2019   Procedure: Deep brain stimulator battery change;  Surgeon: Erline Levine, MD;  Location: [facility];  Service: Neurosurgery;  Laterality: Right;   TONSILLECTOMY     Family History  Problem Relation Age of Onset   Aneurysm  Mother    Alcoholism Father    HIV/AIDS Brother 84       AIDS   Healthy Sister    Healthy  Child    Peripheral vascular disease Other    Arthritis Other    Healthy Sister    Healthy Child    Healthy Child    Diabetes Neg Hx    Heart disease Neg Hx    Colon cancer Neg Hx    Prostate cancer Neg Hx    Social History   Socioeconomic History   Marital status: Married    Spouse name: [name]   Number of children: 2   Years of education: 12   Highest education level: High school graduate  Occupational History   Occupation: DISABLED    Comment: [employer] [place]  Tobacco Use   Smoking status: Never    Passive exposure: Never   Smokeless tobacco: Never  Vaping Use   Vaping Use: Never used  Substance and Sexual Activity   Alcohol use: Not Currently   Drug use: No   Sexual activity: Not Currently  Other Topics Concern   Not on file  Social History Narrative   From [place]   Retired/disability Clinical research associate   Likes to fish.     Married 1972   3 kids   [place] fan   Social Determinants of Health   Financial Resource Strain: Low Risk  (12/23/2020)   Overall Financial Resource Strain (CARDIA)    Difficulty of Paying Living Expenses: Not hard at all  Food Insecurity: No Food Insecurity (12/28/2021)   Hunger Vital Sign    Worried About Running Out of Food in the Last Year: Never true    Ran Out of Food in the Last Year: Never true  Transportation Needs: No Transportation Needs (12/28/2021)   PRAPARE - Hydrologist (Medical): No    Lack of Transportation (Non-Medical): No  Physical Activity: Inactive (12/28/2021)   Exercise Vital Sign    Days of Exercise per Week: 0 days    Minutes of Exercise per Session: 0 min  Stress: No Stress Concern Present (12/28/2021)   [place]    Feeling of Stress : Not at all  Social Connections: [place]  (12/28/2021)   Social Connection and Isolation Panel [NHANES]    Frequency of Communication with Friends and Family: More than three times a week    Frequency of Social Gatherings with Friends and Family: More than three times a week    Attends Religious Services: More than 4 times per year    Active Member of [employer] or Organizations: Yes    Attends Music therapist: More than 4 times per year    Marital Status: Married    Tobacco Counseling Counseling given: Not Answered   Clinical Intake:  Pre-visit preparation completed: No  Pain : No/denies pain     BMI - recorded: 40.14 Nutritional Status: BMI > 30  Obese Nutritional Risks: None Diabetes: Yes CBG done?: Yes CBG resulted in Enter/ Edit results?: Yes (CBG 140 Taken by patient) Did pt. bring in CBG monitor from home?: NoNutrition Risk Assessment:  Has the patient had any N/V/D within the last 2 months?  No  Does the patient have any non-healing wounds?  No  Has the patient had any unintentional weight loss or weight gain?  No   Diabetes:  Is the patient diabetic?  Yes  If diabetic, was a CBG obtained today?  Yes CBG 140 Taken by patient Did the patient bring in their glucometer from home?  No  How often do you monitor  your CBG's? Daily.   Financial Strains and Diabetes Management:  Are you having any financial strains with the device, your supplies or your medication? No .  Does the patient want to be seen by Chronic Care Management for management of their diabetes?  No  Would the patient like to be referred to a Nutritionist or for Diabetic Management?  No   Diabetic Exams:  Diabetic Eye Exam: Completed No. Overdue for diabetic eye exam. Pt has been advised about the importance in completing this exam. A referral has been placed today. Message sent to referral coordinator for scheduling purposes. Advised pt to expect a call from office referred to regarding appt.  Diabetic Foot Exam: Completed No. Pt  has been advised about the importance in completing this exam. Pt is scheduled for diabetic foot exam on Followed by Dr Dwyane Dee.    How often do you need to have someone help you when you read instructions, pamphlets, or other written materials from your doctor or pharmacy?: 1 - Never (Wife assist)  Diabetic? Yes  Interpreter Needed?: No  Information entered by :: Rolene Arbour LPN   Activities of Daily Living    12/28/2021   12:44 PM  In your present state of health, do you have any difficulty performing the following activities:  Hearing? 1  Comment Wears hearing aids  Vision? 0  Difficulty concentrating or making decisions? 0  Walking or climbing stairs? 0  Dressing or bathing? 0  Doing errands, shopping? 0  Preparing Food and eating ? N  Using the Toilet? N  In the past six months, have you accidently leaked urine? N  Do you have problems with loss of bowel control? N  Managing your Medications? N  Managing your Finances? N  Housekeeping or managing your Housekeeping? N    Patient Care Team: Tonia Ghent, MD as PCP - General (Family Medicine) Stanford Breed Denice Bors, MD as PCP - Cardiology (Cardiology) [place], [facility], Eustace Quail, DO as Consulting Physician (Neurology) Charlton Haws, [facility] as Pharmacist (Pharmacist)  Indicate any recent Medical Services you may have received from other than [facility] providers in the past year (date may be approximate).     Assessment:   This is a routine wellness examination for Henry [name].  Hearing/Vision screen Hearing Screening - Comments:: Wears hearing aids Vision Screening - Comments:: Wears rx glasses - up to date with routine eye exams with  Dr Herbert Deaner  Dietary issues and exercise activities discussed: Current Exercise Habits: The patient does not participate in regular exercise at present, Exercise limited by: None identified   Goals Addressed               This Visit's Progress     Keep walking  (pt-stated)         Depression Screen    12/28/2021   12:43 PM 12/23/2020   11:25 AM 08/16/2018   12:16 PM 04/13/2017    1:52 PM 04/04/2017    8:58 AM 09/21/2015   10:54 AM 03/12/2014    2:38 PM  PHQ 2/9 Scores  PHQ - 2 Score 0 0 0 6 0 2 1  PHQ- 9 Score   3  0 12     Fall Risk    12/28/2021   12:45 PM 12/05/2021   12:45 PM 05/31/2021    9:34 AM 12/23/2020   11:23 AM 11/30/2020    9:30 AM  Fall Risk   Falls in the past year? _0 1  0  Number falls in past yr: 1 1 0 0 0  Injury with Fall? 1 0 0 1 0  Comment Rt Ear injury followed by medical attention.   broke nose   Risk for fall due to : Impaired balance/gait   Impaired balance/gait   Risk for fall due to: Comment Dx Parkinsons Disease      Follow up Falls prevention discussed Falls evaluation completed  Falls prevention discussed     FALL RISK PREVENTION PERTAINING TO THE HOME:  Any stairs in or around the home? No  If so, are there any without handrails? No  Home free of loose throw rugs in walkways, pet beds, electrical cords, etc? Yes  Adequate lighting in your home to reduce risk of falls? Yes   ASSISTIVE DEVICES UTILIZED TO PREVENT FALLS:  Life alert? No  Use of a cane, walker or w/c? Yes  Grab bars in the bathroom? Yes  Shower chair or bench in shower? Yes  Elevated toilet seat or a handicapped toilet? Yes   TIMED UP AND GO:  Was the test performed? No . Audio Visit  Cognitive Function:    08/16/2018   12:25 PM 04/04/2017    8:59 AM  MMSE - Mini Mental State Exam  Orientation to time 5 5  Orientation to Place 5 5  Registration 3 3  Attention/ Calculation 5 0  Recall 3 2  Recall-comments  unable to recall 1 of 3 words  Language- name 2 objects 0 0  Language- repeat 1 1  Language- follow 3 step command 0 3  Language- read & follow direction 0 0  Write a sentence 0 0  Copy design 0 0  Total score 22 19        12/28/2021   12:48 PM  6CIT Screen  What Year? 0 points  What month? 0 points  What  time? 0 points  Count back from 20 0 points  Months in reverse 0 points  Repeat phrase 2 points  Total Score 2 points    Immunizations Immunization History  Administered Date(s) Administered   Fluad Quad(high Dose 65+) 09/18/2018, 10/28/2019, 09/27/2021   H1N1 01/22/2008   Influenza Split 12/22/2010, 11/07/2011   Influenza Whole 10/17/2007, 11/16/2008, 11/09/2009   Influenza,inj,Quad PF,6+ Mos 10/01/2014, 09/21/2015, 10/27/2016   Influenza-Unspecified 10/25/2012, 11/27/2013, 09/09/2017, 10/07/2020   PFIZER(Purple Top)SARS-COV-2 Vaccination 05/06/2019, 05/27/2019   Pneumococcal Conjugate-13 03/25/2015   Pneumococcal Polysaccharide-23 01/09/2010, 04/10/2017   Tdap 12/04/2012, 07/21/2020    TDAP status: Up to date  Flu Vaccine status: Up to date  Pneumococcal vaccine status: Up to date  Covid-19 vaccine status: Completed vaccines  Qualifies for Shingles Vaccine? Yes   Zostavax completed No   Shingrix Completed?: No.    Education has been provided regarding the importance of this vaccine. Patient has been advised to call insurance company to determine out of pocket expense if they have not yet received this vaccine. Advised may also receive vaccine at local pharmacy or Health Dept. Verbalized acceptance and understanding.  Screening Tests Health Maintenance  Topic Date Due   FOOT EXAM  10/07/2021   COVID-19 Vaccine (3 - 2023-24 season) 01/13/2022 (Originally 09/09/2021)   Zoster Vaccines- Shingrix (1 of 2) 03/29/2022 (Originally 07/30/2001)   COLONOSCOPY (Pts 45-56yr Insurance coverage will need to be confirmed)  12/29/2022 (Originally 10/13/2021)   Diabetic kidney evaluation - Urine ACR  02/04/2022   HEMOGLOBIN A1C  06/28/2022   OPHTHALMOLOGY EXAM  09/09/2022   Diabetic kidney evaluation - eGFR measurement  12/21/2022   Medicare Annual Wellness (AWV)  12/29/2022   DTaP/Tdap/Td (3 - Td or Tdap) 07/22/2030   Pneumonia Vaccine 34+ Years old  Completed   INFLUENZA VACCINE   Completed   Hepatitis C Screening  Completed   HPV VACCINES  Aged Out    Health Maintenance  Health Maintenance Due  Topic Date Due   FOOT EXAM  10/07/2021    Colorectal cancer screening: Referral to GI placed Patient deferred. Pt aware the office will call re: appt.  Lung Cancer Screening: (Low Dose CT Chest recommended if Age 15-80 years, 30 pack-year currently smoking OR have quit w/in 15years.) does not qualify.     Additional Screening:  Hepatitis C Screening: does qualify; Completed 06/25/17  Vision Screening: Recommended annual ophthalmology exams for early detection of glaucoma and other disorders of the eye. Is the patient up to date with their annual eye exam?  Yes  Who is the provider or what is the name of the office in which the patient attends annual eye exams? Dr Herbert Deaner If pt is not established with a provider, would they like to be referred to a provider to establish care? No .   Dental Screening: Recommended annual dental exams for proper oral hygiene  Community Resource Referral / Chronic Care Management:  CRR required this visit?  No   CCM required this visit?  No      Plan:     I have personally reviewed and noted the following in the patient's chart:   Medical and social history Use of alcohol, tobacco or illicit drugs  Current medications and supplements including opioid prescriptions. Patient is currently taking opioid prescriptions. Information provided to patient regarding non-opioid alternatives. Patient advised to discuss non-opioid treatment plan with their provider. Functional ability and status Nutritional status Physical activity Advanced directives List of other physicians Hospitalizations, surgeries, and ER visits in previous 12 months Vitals Screenings to include cognitive, depression, and falls Referrals and appointments  In addition, I have reviewed and discussed with patient certain preventive protocols, quality metrics, and  best practice recommendations. A written personalized care plan for preventive services as well as general preventive health recommendations were provided to patient.     Criselda Peaches, LPN   [id number]   Nurse Notes: None

## 2021-12-28 NOTE — Patient Instructions (Addendum)
Henry [name] , Thank you for taking time to come for your Medicare Wellness Visit. I appreciate your ongoing commitment to your health goals. Please review the following plan we discussed and let me know if I can assist you in the future.   These are the goals we discussed:  Goals       Increase physical activity      Starting 04/04/2017,  I will continue to participate in physical therapy for 45 minutes twice weekly.       Keep walking (pt-stated)      Monitor and Manage My Blood Sugar-Diabetes Type 2      Timeframe:  Long-Range Goal Priority:  Medium Start Date:    02/08/21                         Expected End Date: 02/08/22                       Follow Up Date April 2023   - check blood sugar at prescribed times - check blood sugar if I feel it is too high or too low - take the blood sugar meter to all doctor visits -Call [facility] to request Freestyle Libre 2    Why is this important?   Checking your blood sugar at home helps to keep it from getting very high or very low.  Writing the results in a diary or log helps the doctor know how to care for you.  Your blood sugar log should have the time, date and the results.  Also, write down the amount of insulin or other medicine that you take.  Other information, like what you ate, exercise done and how you were feeling, will also be helpful.     Notes:       Patient Stated      08/16/2018, would like to feel better      Patient Stated      Would like to have a better diet        This is a list of the screening recommended for you and due dates:  Health Maintenance  Topic Date Due   Complete foot exam   10/07/2021   COVID-19 Vaccine (3 - 2023-24 season) 01/13/2022*   Zoster (Shingles) Vaccine (1 of 2) 03/29/2022*   Colon Cancer Screening  12/29/2022*   Yearly kidney health urinalysis for diabetes  02/04/2022   Hemoglobin A1C  06/28/2022   Eye exam for diabetics  09/09/2022   Yearly kidney function blood test for diabetes   12/21/2022   Medicare Annual Wellness Visit  12/29/2022   DTaP/Tdap/Td vaccine (3 - Td or Tdap) 07/22/2030   Pneumonia Vaccine  Completed   Flu Shot  Completed   Hepatitis C Screening: USPSTF Recommendation to screen - Ages 102-79 yo.  Completed   HPV Vaccine  Aged Out  *Topic was postponed. The date shown is not the original due date.  Opioid Pain Medicine Management Opioids are powerful medicines that are used to treat moderate to severe pain. When used for short periods of time, they can help you to: Sleep better. Do better in physical or occupational therapy. Feel better in the first few days after an injury. Recover from surgery. Opioids should be taken with the supervision of a trained health care provider. They should be taken for the shortest period of time possible. This is because opioids can be addictive, and the longer you take opioids,  the greater your risk of addiction. This addiction can also be called opioid use disorder. What are the risks? Using opioid pain medicines for longer than 3 days increases your risk of side effects. Side effects include: Constipation. Nausea and vomiting. Breathing difficulties (respiratory depression). Drowsiness. Confusion. Opioid use disorder. Itching. Taking opioid pain medicine for a long period of time can affect your ability to do daily tasks. It also puts you at risk for: Motor vehicle crashes. Depression. Suicide. Heart attack. Overdose, which can be life-threatening. What is a pain treatment plan? A pain treatment plan is an agreement between you and your health care provider. Pain is unique to each person, and treatments vary depending on your condition. To manage your pain, you and your health care provider need to work together. To help you do this: Discuss the goals of your treatment, including how much pain you might expect to have and how you will manage the pain. Review the risks and benefits of taking opioid  medicines. Remember that a good treatment plan uses more than one approach and minimizes the chance of side effects. Be honest about the amount of medicines you take and about any drug or alcohol use. Get pain medicine prescriptions from only one health care provider. Pain can be managed with many types of alternative treatments. Ask your health care provider to refer you to one or more specialists who can help you manage pain through: Physical or occupational therapy. Counseling (cognitive behavioral therapy). Good nutrition. Biofeedback. Massage. Meditation. Non-opioid medicine. Following a gentle exercise program. How to use opioid pain medicine Taking medicine Take your pain medicine exactly as told by your health care provider. Take it only when you need it. If your pain gets less severe, you may take less than your prescribed dose if your health care provider approves. If you are not having pain, do nottake pain medicine unless your health care provider tells you to take it. If your pain is severe, do nottry to treat it yourself by taking more pills than instructed on your prescription. Contact your health care provider for help. Write down the times when you take your pain medicine. It is easy to become confused while on pain medicine. Writing the time can help you avoid overdose. Take other over-the-counter or prescription medicines only as told by your health care provider. Keeping yourself and others safe  While you are taking opioid pain medicine: Do not drive, use machinery, or power tools. Do not sign legal documents. Do not drink alcohol. Do not take sleeping pills. Do not supervise children by yourself. Do not do activities that require climbing or being in high places. Do not go to a lake, river, ocean, spa, or swimming pool. Do not share your pain medicine with anyone. Keep pain medicine in a locked cabinet or in a secure area where pets and children cannot reach  it. Stopping your use of opioids If you have been taking opioid medicine for more than a few weeks, you may need to slowly decrease (taper) how much you take until you stop completely. Tapering your use of opioids can decrease your risk of symptoms of withdrawal, such as: Pain and cramping in the abdomen. Nausea. Sweating. Sleepiness. Restlessness. Uncontrollable shaking (tremors). Cravings for the medicine. Do not attempt to taper your use of opioids on your own. Talk with your health care provider about how to do this. Your health care provider may prescribe a step-down schedule based on how much medicine you are taking  and how long you have been taking it. Getting rid of leftover pills Do not save any leftover pills. Get rid of leftover pills safely by: Taking the medicine to a prescription take-back program. This is usually offered by the county or law enforcement. Bringing them to a pharmacy that has a drug disposal container. Flushing them down the toilet. Check the label or package insert of your medicine to see whether this is safe to do. Throwing them out in the trash. Check the label or package insert of your medicine to see whether this is safe to do. If it is safe to throw it out, remove the medicine from the original container, put it into a sealable bag or container, and mix it with used coffee grounds, food scraps, dirt, or cat litter before putting it in the trash. Follow these instructions at home: Activity Do exercises as told by your health care provider. Avoid activities that make your pain worse. Return to your normal activities as told by your health care provider. Ask your health care provider what activities are safe for you. General instructions You may need to take these actions to prevent or treat constipation: Drink enough fluid to keep your urine pale yellow. Take over-the-counter or prescription medicines. Eat foods that are high in fiber, such as beans, whole  grains, and fresh fruits and vegetables. Limit foods that are high in fat and processed sugars, such as fried or sweet foods. Keep all follow-up visits. This is important. Where to find support If you have been taking opioids for a long time, you may benefit from receiving support for quitting from a local support group or counselor. Ask your health care provider for a referral to these resources in your area. Where to find more information Centers for Disease Control and Prevention (CDC): http://www.wolf.info/ [place] Food and Drug Administration (FDA): [place] Get help right away if: You may have taken too much of an opioid (overdosed). Common symptoms of an overdose: Your breathing is slower or more shallow than normal. You have a very slow heartbeat (pulse). You have slurred speech. You have nausea and vomiting. Your pupils become very small. You have other potential symptoms: You are very confused. You faint or feel like you will faint. You have cold, clammy skin. You have blue lips or fingernails. You have thoughts of harming yourself or harming others. These symptoms may represent a serious problem that is an emergency. Do not wait to see if the symptoms will go away. Get medical help right away. Call your local emergency services (911 in the [place] Do not drive yourself to the hospital.  If you ever feel like you may hurt yourself or others, or have thoughts about taking your own life, get help right away. Go to your nearest emergency department or: Call your local emergency services (911 in the [place] Call the [facility] (303)073-1521 in the [place] Call a suicide crisis helpline, such as the [place] at 548-068-4417 or 988 in the [place]. This is open 24 hours a day in the [place] Text the Crisis Text Line at 6171894764 (in the [place].). Summary Opioid medicines can help you manage moderate to severe pain for a short period of time. A pain treatment  plan is an agreement between you and your health care provider. Discuss the goals of your treatment, including how much pain you might expect to have and how you will manage the pain. If you think that you or someone else  may have taken too much of an opioid, get medical help right away. This information is not intended to replace advice given to you by your health care provider. Make sure you discuss any questions you have with your health care provider. Document Revised: 07/21/2020 Document Reviewed: 04/07/2020 Elsevier Patient Education  [place] directives: Please bring a copy of your health care power of attorney and living will to the office to be added to your chart at your convenience.   Conditions/risks identified: None  Next appointment: Follow up in one year for your annual wellness visit.    Preventive Care 41 Years and Older, Male  Preventive care refers to lifestyle choices and visits with your health care provider that can promote health and wellness. What does preventive care include? A yearly physical exam. This is also called an annual well check. Dental exams once or twice a year. Routine eye exams. Ask your health care provider how often you should have your eyes checked. Personal lifestyle choices, including: Daily care of your teeth and gums. Regular physical activity. Eating a healthy diet. Avoiding tobacco and drug use. Limiting alcohol use. Practicing safe sex. Taking low doses of aspirin every day. Taking vitamin and mineral supplements as recommended by your health care provider. What happens during an annual well check? The services and screenings done by your health care provider during your annual well check will depend on your age, overall health, lifestyle risk factors, and family history of disease. Counseling  Your health care provider may ask you questions about your: Alcohol use. Tobacco use. Drug use. Emotional  well-being. Home and relationship well-being. Sexual activity. Eating habits. History of falls. Memory and ability to understand (cognition). Work and work Statistician. Screening  You may have the following tests or measurements: Height, weight, and BMI. Blood pressure. Lipid and cholesterol levels. These may be checked every 5 years, or more frequently if you are over 36 years old. Skin check. Lung cancer screening. You may have this screening every year starting at age 44 if you have a 30-pack-year history of smoking and currently smoke or have quit within the past 15 years. Fecal occult blood test (FOBT) of the stool. You may have this test every year starting at age 42. Flexible sigmoidoscopy or colonoscopy. You may have a sigmoidoscopy every 5 years or a colonoscopy every 10 years starting at age 62. Prostate cancer screening. Recommendations will vary depending on your family history and other risks. Hepatitis C blood test. Hepatitis B blood test. Sexually transmitted disease (STD) testing. Diabetes screening. This is done by checking your blood sugar (glucose) after you have not eaten for a while (fasting). You may have this done every 1-3 years. Abdominal aortic aneurysm (AAA) screening. You may need this if you are a current or former smoker. Osteoporosis. You may be screened starting at age 51 if you are at high risk. Talk with your health care provider about your test results, treatment options, and if necessary, the need for more tests. Vaccines  Your health care provider may recommend certain vaccines, such as: Influenza vaccine. This is recommended every year. Tetanus, diphtheria, and acellular pertussis (Tdap, Td) vaccine. You may need a Td booster every 10 years. Zoster vaccine. You may need this after age 83. Pneumococcal 13-valent conjugate (PCV13) vaccine. One dose is recommended after age 61. Pneumococcal polysaccharide (PPSV23) vaccine. One dose is recommended after  age 62. Talk to your health care provider about which screenings and vaccines  you need and how often you need them. This information is not intended to replace advice given to you by your health care provider. Make sure you discuss any questions you have with your health care provider. Document Released: 01/22/2015 Document Revised: 09/15/2015 Document Reviewed: 10/27/2014 Elsevier Interactive Patient Education  2017 [place] Prevention in the Home Falls can cause injuries. They can happen to people of all ages. There are many things you can do to make your home safe and to help prevent falls. What can I do on the outside of my home? Regularly fix the edges of walkways and driveways and fix any cracks. Remove anything that might make you trip as you walk through a door, such as a raised step or threshold. Trim any bushes or trees on the path to your home. Use bright outdoor lighting. Clear any walking paths of anything that might make someone trip, such as rocks or tools. Regularly check to see if handrails are loose or broken. Make sure that both sides of any steps have handrails. Any raised decks and porches should have guardrails on the edges. Have any leaves, snow, or ice cleared regularly. Use sand or salt on walking paths during winter. Clean up any spills in your garage right away. This includes oil or grease spills. What can I do in the bathroom? Use night lights. Install grab bars by the toilet and in the tub and shower. Do not use towel bars as grab bars. Use non-skid mats or decals in the tub or shower. If you need to sit down in the shower, use a plastic, non-slip stool. Keep the floor dry. Clean up any water that spills on the floor as soon as it happens. Remove soap buildup in the tub or shower regularly. Attach bath mats securely with double-sided non-slip rug tape. Do not have throw rugs and other things on the floor that can make you trip. What can I do in the  bedroom? Use night lights. Make sure that you have a light by your bed that is easy to reach. Do not use any sheets or blankets that are too big for your bed. They should not hang down onto the floor. Have a firm chair that has side arms. You can use this for support while you get dressed. Do not have throw rugs and other things on the floor that can make you trip. What can I do in the kitchen? Clean up any spills right away. Avoid walking on wet floors. Keep items that you use a lot in easy-to-reach places. If you need to reach something above you, use a strong step stool that has a grab bar. Keep electrical cords out of the way. Do not use floor polish or wax that makes floors slippery. If you must use wax, use non-skid floor wax. Do not have throw rugs and other things on the floor that can make you trip. What can I do with my stairs? Do not leave any items on the stairs. Make sure that there are handrails on both sides of the stairs and use them. Fix handrails that are broken or loose. Make sure that handrails are as long as the stairways. Check any carpeting to make sure that it is firmly attached to the stairs. Fix any carpet that is loose or worn. Avoid having throw rugs at the top or bottom of the stairs. If you do have throw rugs, attach them to the floor with carpet tape. Make sure that  you have a light switch at the top of the stairs and the bottom of the stairs. If you do not have them, ask someone to add them for you. What else can I do to help prevent falls? Wear shoes that: Do not have high heels. Have rubber bottoms. Are comfortable and fit you well. Are closed at the toe. Do not wear sandals. If you use a stepladder: Make sure that it is fully opened. Do not climb a closed stepladder. Make sure that both sides of the stepladder are locked into place. Ask someone to hold it for you, if possible. Clearly mark and make sure that you can see: Any grab bars or  handrails. First and last steps. Where the edge of each step is. Use tools that help you move around (mobility aids) if they are needed. These include: Canes. Walkers. Scooters. Crutches. Turn on the lights when you go into a dark area. Replace any light bulbs as soon as they burn out. Set up your furniture so you have a clear path. Avoid moving your furniture around. If any of your floors are uneven, fix them. If there are any pets around you, be aware of where they are. Review your medicines with your doctor. Some medicines can make you feel dizzy. This can increase your chance of falling. Ask your doctor what other things that you can do to help prevent falls. This information is not intended to replace advice given to you by your health care provider. Make sure you discuss any questions you have with your health care provider. Document Released: 10/22/2008 Document Revised: 06/03/2015 Document Reviewed: 01/30/2014 Elsevier Interactive Patient Education  2017 [employer].

## 2022-01-04 ENCOUNTER — Encounter: Payer: Self-pay | Admitting: Endocrinology

## 2022-01-10 ENCOUNTER — Other Ambulatory Visit: Payer: Self-pay | Admitting: Endocrinology

## 2022-01-10 DIAGNOSIS — E890 Postprocedural hypothyroidism: Secondary | ICD-10-CM

## 2022-01-11 ENCOUNTER — Encounter: Payer: Self-pay | Admitting: Family Medicine

## 2022-01-11 ENCOUNTER — Ambulatory Visit (INDEPENDENT_AMBULATORY_CARE_PROVIDER_SITE_OTHER): Payer: Medicare Other | Admitting: Family Medicine

## 2022-01-11 ENCOUNTER — Encounter: Payer: Self-pay | Admitting: Neurology

## 2022-01-11 VITALS — BP 124/78 | HR 52 | Ht 68.0 in | Wt 267.0 lb

## 2022-01-11 DIAGNOSIS — K6289 Other specified diseases of anus and rectum: Secondary | ICD-10-CM | POA: Diagnosis not present

## 2022-01-11 DIAGNOSIS — J029 Acute pharyngitis, unspecified: Secondary | ICD-10-CM | POA: Diagnosis not present

## 2022-01-11 DIAGNOSIS — L729 Follicular cyst of the skin and subcutaneous tissue, unspecified: Secondary | ICD-10-CM | POA: Diagnosis not present

## 2022-01-11 MED ORDER — DOXYCYCLINE HYCLATE 100 MG PO TABS: 100.0000 mg | ORAL_TABLET | Freq: Two times a day (BID) | ORAL | 0 refills | Status: DC

## 2022-01-11 NOTE — Patient Instructions (Addendum)
Take doxycycline antibiotic twice daily with food for 7 days.  May use dressing changes as discussed. Warm compresses several times a day to keep pore open to continue draining.  If not improving with this, let us know for referral to general surgeon.

## 2022-01-11 NOTE — Progress Notes (Unsigned)
Patient ID: Henry [name], male    DOB: [birth year], 72 y.o.   MRN: [id number]  This visit was conducted in person.  BP 124/78   Pulse (!) 52   Ht _0  (1.727 m)   Wt 267 lb (121.1 kg)   SpO2 99%   BMI 40.60 kg/m    CC: R buttock sore "I have a boil", ST Subjective:   HPI: Henry [name] is a 72 y.o. male presenting on 01/11/2022 for Abscess (Right side of buttock) and Sore Throat (Comes and goes)   Pt of Dr Josefine Class with h/o AICD, asthma, CAD, DM, HTN, HLD, hypothyroidism, OSA, gout, and parkinson's disease s/p DBS placement (Tat).    4d ago noted boil midline buttock, drained on Sunday but seems to be recurring. Drainage has been serosanguinous, initially thicker syrup. No stool.   Also notes off and on sore throat since strep throat dx 12/16/2021, treated with 10d clindamycin 365m TID abx. H/o PCN allergy.  No ear or tooth pain, facial pain, HA, cough.   No fevers/chills, abd pain, nausea/vomiting.  Sees Dr CStanford Breedfor known CHF, CAD s/p bypass and graft, ICD placed 11/2013, afib, most recently lasix increased to 843mbid. Off AC due to fall risk in [place].   Lab Results  Component Value Date   HGBA1C 6.3 (A) 12/27/2021       Relevant past medical, surgical, family and social history reviewed and updated as indicated. Interim medical history since our last visit reviewed. Allergies and medications reviewed and updated. Outpatient Medications Prior to Visit  Medication Sig Dispense Refill   acetaminophen (TYLENOL) 500 MG tablet Take 1,000 mg by mouth at bedtime as needed for mild pain or headache.      albuterol (VENTOLIN HFA) 108 (90 Base) MCG/ACT inhaler Inhale 2 puffs into the lungs every 6 (six) hours as needed for wheezing or shortness of breath. 18 g 5   allopurinol (ZYLOPRIM) 100 MG tablet TAKE 1 & 1/2 TABLETS BY MOUTH DAILY 135 tablet 3   aspirin EC 81 MG tablet Take 1 tablet (81 mg total) by mouth daily. Swallow whole. 90 tablet 3   carbidopa-levodopa (SINEMET  CR) 50-200 MG tablet TAKE 1 TABLET BY MOUTH EVERYDAY AT BEDTIME 90 tablet 0   Carbidopa-Levodopa ER (SINEMET CR) 25-100 MG tablet controlled release TAKE 2 TABLETS BY MOUTH 5 TIMES PER DAY AS DIRECTED 900 tablet 0   Cholecalciferol (VITAMIN D) 50 MCG (2000 UT) tablet Take 2,000 Units by mouth daily.      clotrimazole (LOTRIMIN) 1 % cream Apply 1 Application topically 2 (two) times daily. 30 g 0   colchicine 0.6 MG tablet Take 1 tablet (0.6 mg total) by mouth daily as needed (gout).     Continuous Blood Gluc Receiver (DEXCOM G6 RECEIVER) DEVI Use to check blood sugar daily 1 each 0   Continuous Blood Gluc Sensor (DEXCOM G6 SENSOR) MISC Use to check blood sugar daily 9 each 3   Continuous Blood Gluc Transmit (DEXCOM G6 TRANSMITTER) MISC Change every 3 months 1 each 3   dorzolamide-timolol (COSOPT) 22.3-6.8 MG/ML ophthalmic solution Place 1 drop into the right eye 2 (two) times daily.     DULoxetine (CYMBALTA) 30 MG capsule TAKE 1 CAPSULE BY MOUTH EVERY DAY 90 capsule 1   fluticasone-salmeterol (ADVAIR) 250-50 MCG/ACT AEPB Inhale 1 puff into the lungs in the morning and at bedtime. Rinse after use. 180 each 3   FREESTYLE LITE test strip USE TO CHECK BLOOD SUGAR  4 TIMES DAILY. 150 strip 3   furosemide (LASIX) 80 MG tablet Take 1 tablet (80 mg total) by mouth daily. With extra tab a day if AM weight is above 263 lbs.     HUMALOG 100 UNIT/ML injection USE MAXIMUM 76 UNITS PER   DAY WITH V-GO PUMP 60 mL 3   Insulin Disposable Pump (OMNIPOD 5 G6 INTRO, GEN 5,) KIT 1 kit by Does not apply route daily. 1 kit 0   Insulin Disposable Pump (OMNIPOD 5 G6 POD, GEN 5,) MISC CHANGE POD EVERY 3 DAYS 10 each 3   KLOR-CON M20 20 MEQ tablet TAKE 3 TABLETS (60 MEQ) BY MOUTH DAILY. 270 tablet 1   levothyroxine (SYNTHROID) 137 MCG tablet TAKE 1 TABLET BY MOUTH EVERY DAY BEFORE BREAKFAST 90 tablet 1   losartan (COZAAR) 25 MG tablet Take 1 tablet (25 mg total) by mouth daily. Please keep scheduled appointment 90 tablet 0    metoprolol succinate (TOPROL-XL) 50 MG 24 hr tablet TAKE 1 TABLET BY MOUTH EVERY DAY WITH OR IMMEDIATELY FOLLOWING A MEAL 90 tablet 1   omega-3 acid ethyl esters (LOVAZA) 1 g capsule TAKE 2 CAPSULES BY MOUTH TWICE A DAY 360 capsule 1   OVER THE COUNTER MEDICATION Apply 1 application topically daily as needed (pain). Theraworx Pain Cream     Pimavanserin Tartrate (NUPLAZID) 34 MG CAPS Samples of this drug were given to the patient, quantity 5, Lot Number [id number] Exp 05/2022 30 capsule 0   Pimavanserin Tartrate (NUPLAZID) 34 MG CAPS Take 1 capsule (34 mg total) by mouth daily. 90 capsule 1   polyethylene glycol (MIRALAX / GLYCOLAX) packet Take 8.5 g by mouth daily.     rosuvastatin (CRESTOR) 20 MG tablet TAKE 1 TABLET BY MOUTH EVERYDAY AT BEDTIME 90 tablet 1   tirzepatide (MOUNJARO) 5 MG/0.5ML Pen Inject 5 mg into the skin once a week. 6 mL 1   traMADol (ULTRAM) 50 MG tablet TAKE 1 TABLET BY MOUTH TWICE A DAY 60 tablet 2   vitamin B-12 (CYANOCOBALAMIN) 1000 MCG tablet Take 1,000 mcg by mouth daily.     nitroGLYCERIN (NITROSTAT) 0.4 MG SL tablet Place 1 tablet (0.4 mg total) under the tongue every 5 (five) minutes as needed for chest pain. 25 tablet 12   No facility-administered medications prior to visit.     Per HPI unless specifically indicated in ROS section below Review of Systems  Objective:  BP 124/78   Pulse (!) 52   Ht _0  (1.727 m)   Wt 267 lb (121.1 kg)   SpO2 99%   BMI 40.60 kg/m   Wt Readings from Last 3 Encounters:  01/11/22 267 lb (121.1 kg)  12/28/21 264 lb (119.7 kg)  12/27/21 264 lb (119.7 kg)      Physical Exam Vitals and nursing note reviewed.  Constitutional:      Appearance: He is obese.     Comments: Ambulates with parkinson walker  HENT:     Head: Normocephalic and atraumatic.     Mouth/Throat:     Mouth: Mucous membranes are moist.     Pharynx: Oropharynx is clear. No oropharyngeal exudate or posterior oropharyngeal erythema.  Eyes:     General:         Right eye: No discharge.        Left eye: No discharge.     Extraocular Movements: Extraocular movements intact.     Conjunctiva/sclera: Conjunctivae normal.     Pupils: Pupils are equal, round, and reactive  to light.  Cardiovascular:     Rate and Rhythm: Normal rate and regular rhythm.     Pulses: Normal pulses.     Heart sounds: No murmur heard. Pulmonary:     Breath sounds: Wheezing and rhonchi present. No rales.     Comments:  Bibasilar crackles Faint diffuse wheezing Genitourinary:      Comments: Open pore anterior and right to anus with induration deep to area without significant erythema. Expressed minimal amt of sanguinous drainage. Wound culture collected.  Neurological:     Mental Status: He is alert.  Psychiatric:        Mood and Affect: Mood normal.        Behavior: Behavior normal.        Assessment & Plan:   Problem List Items Addressed This Visit   None Visit Diagnoses     Cyst of buttocks    -  Primary   Relevant Orders   WOUND CULTURE        Meds ordered this encounter  Medications   doxycycline (VIBRA-TABS) 100 MG tablet    Sig: Take 1 tablet (100 mg total) by mouth 2 (two) times daily.    Dispense:  14 tablet    Refill:  0   Orders Placed This Encounter  Procedures   WOUND CULTURE    Order Specific Question:   Source    Answer:   R inner buttock     Patient Instructions  Take doxycycline antibiotic twice daily with food for 7 days.  May use dressing changes as discussed. Warm compresses several times a day to keep pore open to continue draining.  If not improving with this, let us know for referral to general surgeon.   Follow up plan: No follow-ups on file.  Ria Bush, MD

## 2022-01-12 ENCOUNTER — Other Ambulatory Visit: Payer: Self-pay | Admitting: Neurology

## 2022-01-12 ENCOUNTER — Other Ambulatory Visit: Payer: Self-pay | Admitting: Family Medicine

## 2022-01-12 DIAGNOSIS — K6289 Other specified diseases of anus and rectum: Secondary | ICD-10-CM | POA: Insufficient documentation

## 2022-01-12 DIAGNOSIS — J029 Acute pharyngitis, unspecified: Secondary | ICD-10-CM | POA: Insufficient documentation

## 2022-01-12 DIAGNOSIS — G20A1 Parkinson's disease without dyskinesia, without mention of fluctuations: Secondary | ICD-10-CM

## 2022-01-12 NOTE — Assessment & Plan Note (Addendum)
Most consistent with perianal cyst, r/o abscess. Currently with open pore with underlying persistent induration but no fluctuance, no significant drainage. Wound culture sent regardless. Don't think he would benefit from I&D at this time. Encouraged warm compresses several times a day to keep area draining. Will Rx doxycycline 7d course with customary precautions. If not improving as expected, low threshold to refer to gen surgery given location. Discussed all this with patient and wife.

## 2022-01-12 NOTE — Assessment & Plan Note (Signed)
Intermittent sore throat despite strep treatment with clindamycin 1 month ago. PCN allergy. Benign exam. Doxycycline for below should cover any residual infection.

## 2022-01-13 NOTE — Telephone Encounter (Signed)
Refill request for TRAMADOL HCL 50 MG TABLET   LOV - 12/16/21 Next OV - not scheduled Last refill - 10/18/21 #60/2

## 2022-01-14 LAB — WOUND CULTURE
MICRO NUMBER:: 14382813
SPECIMEN QUALITY:: ADEQUATE

## 2022-01-18 ENCOUNTER — Encounter: Payer: Self-pay | Admitting: Endocrinology

## 2022-01-18 ENCOUNTER — Ambulatory Visit (INDEPENDENT_AMBULATORY_CARE_PROVIDER_SITE_OTHER): Payer: Medicare Other

## 2022-01-18 DIAGNOSIS — I255 Ischemic cardiomyopathy: Secondary | ICD-10-CM | POA: Diagnosis not present

## 2022-01-18 LAB — CUP PACEART REMOTE DEVICE CHECK
Battery Remaining Longevity: 28 mo
Battery Voltage: 2.93 V
Brady Statistic AP VP Percent: 0.01 %
Brady Statistic AP VS Percent: 20.64 %
Brady Statistic AS VP Percent: 0.06 %
Brady Statistic AS VS Percent: 79.28 %
Brady Statistic RA Percent Paced: 20.58 %
Brady Statistic RV Percent Paced: 0.07 %
Date Time Interrogation Session: 20240110133730
HighPow Impedance: 70 Ohm
Implantable Lead Connection Status: 753985
Implantable Lead Connection Status: 753985
Implantable Lead Implant Date: 20151105
Implantable Lead Implant Date: 20151105
Implantable Lead Location: 753859
Implantable Lead Location: 753860
Implantable Lead Model: 5076
Implantable Pulse Generator Implant Date: 20151105
Lead Channel Impedance Value: 399 Ohm
Lead Channel Impedance Value: 456 Ohm
Lead Channel Impedance Value: 494 Ohm
Lead Channel Pacing Threshold Amplitude: 0.5 V
Lead Channel Pacing Threshold Amplitude: 1.125 V
Lead Channel Pacing Threshold Pulse Width: 0.4 ms
Lead Channel Pacing Threshold Pulse Width: 0.4 ms
Lead Channel Sensing Intrinsic Amplitude: 1.5 mV
Lead Channel Sensing Intrinsic Amplitude: 1.5 mV
Lead Channel Sensing Intrinsic Amplitude: 15.75 mV
Lead Channel Sensing Intrinsic Amplitude: 15.75 mV
Lead Channel Setting Pacing Amplitude: 2.25 V
Lead Channel Setting Pacing Amplitude: 2.5 V
Lead Channel Setting Pacing Pulse Width: 0.4 ms
Lead Channel Setting Sensing Sensitivity: 0.3 mV
Zone Setting Status: 755011

## 2022-01-23 ENCOUNTER — Ambulatory Visit (HOSPITAL_COMMUNITY): Payer: Medicare Other | Attending: Cardiology

## 2022-01-23 DIAGNOSIS — I5022 Chronic systolic (congestive) heart failure: Secondary | ICD-10-CM | POA: Insufficient documentation

## 2022-01-23 DIAGNOSIS — I255 Ischemic cardiomyopathy: Secondary | ICD-10-CM | POA: Insufficient documentation

## 2022-01-23 LAB — ECHOCARDIOGRAM COMPLETE
Area-P 1/2: 3.54 cm2
S' Lateral: 4.1 cm

## 2022-01-23 MED ORDER — PERFLUTREN LIPID MICROSPHERE
3.0000 mL | INTRAVENOUS | Status: AC | PRN
  Administered 2022-01-23: 3 mL via INTRAVENOUS

## 2022-01-27 DIAGNOSIS — R3121 Asymptomatic microscopic hematuria: Secondary | ICD-10-CM | POA: Diagnosis not present

## 2022-01-29 NOTE — Progress Notes (Signed)
Cardiology Clinic Note   Patient Name: Henry [name] Date of Encounter: 01/31/2022  Primary Care Provider:  Tonia Ghent, MD Primary Cardiologist:  Kirk Ruths, MD  Patient Profile    Henry [name] 72 year old male presents to the clinic today for follow-up evaluation of his chronic systolic CHF.  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   Dysrhythmia    a-fib   GERD (gastroesophageal reflux disease)    Glaucoma    right eye   History of kidney stones    multiple   HLD (hyperlipidemia)    HTN (hypertension)    pt denies 08/19/12   Hyperplasia, prostate    Hypothyroidism    MI (myocardial infarction) ([place])    Dr Crenshaw 2000, x3vessels bypass   OSA (obstructive sleep apnea)    AHI-28,on CPAP, noncompliant with CPAP   Parkinson disease    1999   PONV (postoperative nausea and vomiting)    Restless legs    Shortness of breath    Hx: of at all times   UTI (lower urinary tract infection) 09/15/12   Klebsiella   Ventral hernia    Walker as ambulation aid    also uses wheelchair at home/when going out   Past Surgical History:  Procedure Laterality Date   [place]   right total loss   BIOPSY  10/14/2018   Procedure: BIOPSY;  Surgeon: Jerene Bears, MD;  Location: [facility];  Service: Gastroenterology;;   CARDIAC CATHETERIZATION     CATARACT EXTRACTION W/ INTRAOCULAR LENS IMPLANT     Hx: of right eye   CATARACT EXTRACTION W/ INTRAOCULAR LENS IMPLANT Left 2018   COLONOSCOPY N/A 10/13/2014   Procedure:  COLONOSCOPY;  Surgeon: Jerene Bears, MD;  Location: [facility];  Service: Gastroenterology;  Laterality: N/A;   COLONOSCOPY W/ BIOPSIES AND POLYPECTOMY     Hx: of   COLONOSCOPY WITH PROPOFOL N/A 10/14/2018   Procedure: COLONOSCOPY WITH PROPOFOL;  Surgeon: Jerene Bears, MD;  Location: [facility];  Service: Gastroenterology;  Laterality: N/A;   CORONARY ARTERY BYPASS GRAFT  2000   [name], MD   CORONARY STENT PLACEMENT  1998   DEEP BRAIN STIMULATOR PLACEMENT  2004   Right and left VIN stimulator placement (parkinsons)   EYE SURGERY     FINGER AMPUTATION     left pointer   IMPLANTABLE CARDIOVERTER DEFIBRILLATOR IMPLANT N/A 11/13/2013   Procedure: IMPLANTABLE CARDIOVERTER DEFIBRILLATOR IMPLANT;  Surgeon: Evans Lance, MD;  Location: [facility] CATH LAB;  Service: Cardiovascular;  Laterality: N/A;   INSERT / REPLACE / REMOVE PACEMAKER     medtronic   LEFT AND RIGHT HEART CATHETERIZATION WITH CORONARY ANGIOGRAM N/A 09/24/2013   Procedure: LEFT AND RIGHT HEART CATHETERIZATION WITH CORONARY ANGIOGRAM;  Surgeon: Burnell Blanks, MD;  Location: [facility] C[facility] LAB;  Service: Cardiovascular;  Laterality: N/A;   LITHOTRIPSY  3 different times   MEDIAN STERNOTOMY  2000   POLYPECTOMY  10/14/2018   Procedure: POLYPECTOMY;  Surgeon: Jerene Bears, MD;  Location: [facility];  Service: Gastroenterology;;   PULSE GENERATOR IMPLANT Right 11/13/2017   Procedure: Right chest implantable pulse generator change;  Surgeon: Erline Levine, MD;  Location: [place];  Service: Neurosurgery;  Laterality: Right;  Right chest implantable pulse generator change   SUBTHALAMIC STIMULATOR BATTERY REPLACEMENT N/A 09/05/2012   Procedure: Deep brain stimulator battery change;  Surgeon: Erline Levine, MD;  Location: [place] [facility];  Service: Neurosurgery;  Laterality: N/A;  Deep brain stimulator battery change   SUBTHALAMIC STIMULATOR BATTERY REPLACEMENT N/A 06/10/2015   Procedure: Deep Brain stimulator battery change;  Surgeon:  Erline Levine, MD;  Location: [place] [facility];  Service: Neurosurgery;  Laterality: N/A;   SUBTHALAMIC STIMULATOR BATTERY REPLACEMENT Right 09/25/2019   Procedure: Deep brain stimulator battery change;  Surgeon: Erline Levine, MD;  Location: [place];  Service: Neurosurgery;  Laterality: Right;   TONSILLECTOMY      Allergies  Allergies  Allergen Reactions   Penicillins Anaphylaxis and Rash    Because of a history of documented adverse serious drug reaction;Medi Alert bracelet  is recommended PATIENT HAS HAD A PCN REACTION WITH IMMEDIATE RASH, FACIAL/TONGUE/THROAT SWELLING, SOB, OR LIGHTHEADEDNESS WITH HYPOTENSION:  #  #  YES  #  #  Has patient had a PCN reaction causing severe rash involving mucus membranes or skin necrosis: unknown Has patient had a PCN reaction that required hospitalization NO Has patient had a PCN reaction occurring within the last 10 years: NO   Klonopin [Clonazepam] Other (See Comments)    agitation   Peanut-Containing Drug Products Cough   Watermelon [Citrullus Vulgaris] Other (See Comments) and Cough    Tickle in throat    History of Present Illness    Henry [name] has a PMH of chronic systolic CHF, ischemic cardiomyopathy, CAD, PAF, essential hypertension, and ICD implanted 11/13/2013.  He is status post bypass graft and his PMH also includes diabetes and Parkinson's.  His carotid Dopplers 1/05 showed 0-39% stenosis, stress testing 6/15 showed an EF of 41%, prior MI and no ischemia.  His cardiac catheterization 9/15 showed pulmonary wedge pressure 16 and PA pressure 32/20.  His LAD was occluded.  He had nonobstructive disease in his circumflex.  His RCA had severe diffuse distal disease.  SVG graft to PDA was patent.  His SVG-diagonal patent.  LIMA-mid LAD patent, medical therapy was recommended.  Echocardiogram 5/21 showed an ejection fraction of 40-45% with akinesis of the anterior septal wall and apex.  He was noted to have atrial fibrillation on device interrogation  previously.  He was asymptomatic with this episodes.  Echocardiogram 01/23/2022 showed continued reduced EF of 30-35%, mildly dilated left atria, mildly dilated right atrium, trivial mitral valve regurgitation.  Dr. Stanford Breed did not plan to transition to [facility] due to patient's orthostasis related to Parkinson's.  He was seen in follow-up by Dr. Stanford Breed on 12/20/2021.  During that time he did notice some dyspnea on exertion, orthopnea, weight gain and bilateral lower extremity edema.  He denied chest pain.  His Lasix had been recently increased to 80 mg twice daily which was showing improvement.  He presents to the clinic today for follow-up evaluation and states his weight at home is back in the 161 pound range.  He continues to weigh daily.  He is very conscious of his sodium intake.  He presents with his wife.  We reviewed his echocardiogram and they expressed understanding.  He reports being fairly sedentary due to his Parkinson's.  He continues to use his rolling walker.  His wife reports that he ambulates around the house but does not have any formal exercise routine.  I will have him continue his daily weight regimen, give chair exercises, request labs from nephrology, and plan follow-up in 4 to 6 months.  Today he denies chest pain, increased shortness of breath, lower extremity edema, fatigue, palpitations, melena, hematuria, hemoptysis, diaphoresis, weakness, presyncope, syncope, orthopnea, and PND.   Home Medications    Prior to Admission medications   Medication Sig Start Date End Date Taking? Authorizing Provider  acetaminophen (TYLENOL) 500 MG tablet Take 1,000 mg by mouth at bedtime as needed for mild pain or headache.     [provider]  albuterol (VENTOLIN HFA) 108 (90 Base) MCG/ACT inhaler Inhale 2 puffs into the lungs every 6 (six) hours as needed for wheezing or shortness of breath. 11/01/21   Tonia Ghent, MD  allopurinol (ZYLOPRIM) 100 MG tablet TAKE 1 & 1/2  TABLETS BY MOUTH DAILY 06/09/21   Tonia Ghent, MD  aspirin EC 81 MG tablet Take 1 tablet (81 mg total) by mouth daily. Swallow whole. 10/30/19   Lelon Perla, MD  carbidopa-levodopa (SINEMET CR) 50-200 MG tablet TAKE 1 TABLET BY MOUTH EVERYDAY AT BEDTIME 01/12/22   Tat, Eustace Quail, DO  Carbidopa-Levodopa ER (SINEMET CR) 25-100 MG tablet controlled release TAKE 2 TABLETS BY MOUTH 5 TIMES PER DAY AS DIRECTED 11/28/21   Tat, Eustace Quail, DO  Cholecalciferol (VITAMIN D) 50 MCG (2000 UT) tablet Take 2,000 Units by mouth daily.     [provider]  clotrimazole (LOTRIMIN) 1 % cream Apply 1 Application topically 2 (two) times daily. 09/20/21   Ria Bush, MD  colchicine 0.6 MG tablet Take 1 tablet (0.6 mg total) by mouth daily as needed (gout). 05/13/20   Tonia Ghent, MD  Continuous Blood Gluc Receiver (DEXCOM G6 RECEIVER) DEVI Use to check blood sugar daily 02/24/21   Elayne Snare, MD  Continuous Blood Gluc Sensor (DEXCOM G6 SENSOR) MISC Use to check blood sugar daily 12/15/21   Elayne Snare, MD  Continuous Blood Gluc Transmit (DEXCOM G6 TRANSMITTER) MISC Change every 3 months 12/15/21   Elayne Snare, MD  dorzolamide-timolol (COSOPT) 22.3-6.8 MG/ML ophthalmic solution Place 1 drop into the right eye 2 (two) times daily. 08/29/19   [provider]  doxycycline (VIBRA-TABS) 100 MG tablet Take 1 tablet (100 mg total) by mouth 2 (two) times daily. 01/11/22   Ria Bush, MD  DULoxetine (CYMBALTA) 30 MG capsule TAKE 1 CAPSULE BY MOUTH EVERY DAY 08/22/21   Tat, Eustace Quail, DO  fluticasone-salmeterol (ADVAIR) 250-50 MCG/ACT AEPB Inhale 1 puff into the lungs in the morning and at bedtime. Rinse after use. 11/01/21   Tonia Ghent, MD  FREESTYLE LITE test strip USE TO CHECK BLOOD SUGAR 4 TIMES DAILY. 04/13/20   Elayne Snare, MD  furosemide (LASIX) 80 MG tablet Take 1 tablet (80 mg total) by mouth daily. With extra tab a day if AM weight is above 263 lbs. 11/25/21   Tonia Ghent, MD   HUMALOG 100 UNIT/ML injection USE MAXIMUM 76 UNITS PER   DAY WITH V-GO PUMP 03/09/21   Elayne Snare, MD  Insulin Disposable Pump (OMNIPOD 5 G6 INTRO, GEN 5,) KIT 1 kit by Does not apply route daily. 02/24/21   Elayne Snare, MD  Insulin Disposable  Pump (OMNIPOD 5 G6 POD, GEN 5,) MISC CHANGE POD EVERY 3 DAYS 12/07/21   Elayne Snare, MD  KLOR-CON M20 20 MEQ tablet TAKE 3 TABLETS (60 MEQ) BY MOUTH DAILY. 07/18/21   Tonia Ghent, MD  levothyroxine (SYNTHROID) 137 MCG tablet TAKE 1 TABLET BY MOUTH EVERY DAY BEFORE BREAKFAST 01/10/22   Elayne Snare, MD  losartan (COZAAR) 25 MG tablet Take 1 tablet (25 mg total) by mouth daily. Please keep scheduled appointment 10/31/21   Lelon Perla, MD  metoprolol succinate (TOPROL-XL) 50 MG 24 hr tablet TAKE 1 TABLET BY MOUTH EVERY DAY WITH OR IMMEDIATELY FOLLOWING A MEAL 10/31/21   Tonia Ghent, MD  nitroGLYCERIN (NITROSTAT) 0.4 MG SL tablet Place 1 tablet (0.4 mg total) under the tongue every 5 (five) minutes as needed for chest pain. 10/30/19 12/20/21  Lelon Perla, MD  omega-3 acid ethyl esters (LOVAZA) 1 g capsule TAKE 2 CAPSULES BY MOUTH TWICE A DAY 10/31/21   Elayne Snare, MD  OVER THE COUNTER MEDICATION Apply 1 application topically daily as needed (pain). Theraworx Pain Cream    [provider]  Pimavanserin Tartrate (NUPLAZID) 34 MG CAPS Samples of this drug were given to the patient, quantity 5, Lot Number [id number] Exp 05/2022 12/05/21   Tat, Eustace Quail, DO  Pimavanserin Tartrate (NUPLAZID) 34 MG CAPS Take 1 capsule (34 mg total) by mouth daily. 12/05/21   Tat, Eustace Quail, DO  polyethylene glycol (MIRALAX / GLYCOLAX) packet Take 8.5 g by mouth daily.    [provider]  rosuvastatin (CRESTOR) 20 MG tablet TAKE 1 TABLET BY MOUTH EVERYDAY AT BEDTIME 10/31/21   Elayne Snare, MD  tirzepatide [facility]) 5 MG/0.5ML Pen Inject 5 mg into the skin once a week. 11/23/21   Elayne Snare, MD  traMADol Veatrice Bourbon) 50 MG tablet TAKE 1 TABLET BY MOUTH TWICE  A DAY 01/14/22   Tonia Ghent, MD  vitamin B-12 (CYANOCOBALAMIN) 1000 MCG tablet Take 1,000 mcg by mouth daily.    [provider]    Family History    Family History  Problem Relation Age of Onset   Aneurysm Mother    Alcoholism Father    HIV/AIDS Brother 63       AIDS   Healthy Sister    Healthy Child    Peripheral vascular disease Other    Arthritis Other    Healthy Sister    Healthy Child    Healthy Child    Diabetes Neg Hx    Heart disease Neg Hx    Colon cancer Neg Hx    Prostate cancer Neg Hx    He indicated that his mother is deceased. He indicated that his father is deceased. He indicated that both of his sisters are alive. He indicated that only one of his two brothers is alive. He indicated that all of his three children are alive. He indicated that the status of his neg hx is unknown. He indicated that the status of his other is unknown.  Social History    Social History   Socioeconomic History   Marital status: Married    Spouse name: [name]   Number of children: 2   Years of education: 12   Highest education level: High school graduate  Occupational History   Occupation: DISABLED    Comment: [employer]  Tobacco Use   Smoking status: Never    Passive exposure: Never   Smokeless tobacco: Never  Vaping Use   Vaping Use: Never  used  Substance and Sexual Activity   Alcohol use: Not Currently   Drug use: No   Sexual activity: Not Currently  Other Topics Concern   Not on file  Social History Narrative   From [facility]   Retired/disability Clinical research associate   Likes to fish.     Married 1972   3 kids   [place] fan   Social Determinants of Health   Financial Resource Strain: Low Risk  (12/23/2020)   Overall Financial Resource Strain (CARDIA)    Difficulty of Paying Living Expenses: Not hard at all  Food Insecurity: No Food Insecurity (12/28/2021)   Hunger Vital Sign    Worried About Running Out of Food in the Last  Year: Never true    Ran Out of Food in the Last Year: Never true  Transportation Needs: No Transportation Needs (12/28/2021)   PRAPARE - Hydrologist (Medical): No    Lack of Transportation (Non-Medical): No  Physical Activity: Inactive (12/28/2021)   Exercise Vital Sign    Days of Exercise per Week: 0 days    Minutes of Exercise per Session: 0 min  Stress: No Stress Concern Present (12/28/2021)   [place]    Feeling of Stress : Not at all  Social Connections: [place] (12/28/2021)   Social Connection and Isolation Panel [NHANES]    Frequency of Communication with Friends and Family: More than three times a week    Frequency of Social Gatherings with Friends and Family: More than three times a week    Attends Religious Services: More than 4 times per year    Active Member of [employer] or Organizations: Yes    Attends Music therapist: More than 4 times per year    Marital Status: Married  Human resources officer Violence: Not At Risk (12/28/2021)   Humiliation, Afraid, Rape, and Kick questionnaire    Fear of Current or Ex-Partner: No    Emotionally Abused: No    Physically Abused: No    Sexually Abused: No     Review of Systems    General:  No chills, fever, night sweats or weight changes.  Cardiovascular:  No chest pain, dyspnea on exertion, edema, orthopnea, palpitations, paroxysmal nocturnal dyspnea. Dermatological: No rash, lesions/masses Respiratory: No cough, dyspnea Urologic: No hematuria, dysuria Abdominal:   No nausea, vomiting, diarrhea, bright red blood per rectum, melena, or hematemesis Neurologic:  No visual changes, wkns, changes in mental status. All other systems reviewed and are otherwise negative except as noted above.  Physical Exam    VS:  BP 114/70 (BP Location: Right Arm, Patient Position: Sitting, Cuff Size: Large)   Pulse 72   Ht '5\' 8"'$  (1.727  m)   Wt 267 lb (121.1 kg)   BMI 40.60 kg/m  , BMI Body mass index is 40.6 kg/m. GEN: Well nourished, well developed, in no acute distress. HEENT: normal. Neck: Supple, no JVD, carotid bruits, or masses. Cardiac: RRR, no murmurs, rubs, or gallops. No clubbing, cyanosis, edema.  Radials/DP/PT 2+ and equal bilaterally.  Respiratory:  Respirations regular and unlabored, clear to auscultation bilaterally. GI: Soft, nontender, nondistended, BS + x 4. MS: no deformity or atrophy. Skin: warm and dry, no rash. Neuro:  Strength and sensation are intact. Psych: Normal affect.  Accessory Clinical Findings    Recent Labs: 12/20/2021: BUN 18; Creatinine, Ser 1.20; NT-Pro BNP 86; Potassium 4.4; Sodium 143 12/27/2021: ALT 5; TSH 2.70  Recent Lipid Panel    Component Value Date/Time   CHOL 115 12/27/2021 1204   TRIG 128.0 12/27/2021 1204   HDL 36.20 (L) 12/27/2021 1204   CHOLHDL 3 12/27/2021 1204   VLDL 25.6 12/27/2021 1204   LDLCALC 53 12/27/2021 1204   LDLDIRECT 51.0 04/24/2019 0925         ECG personally reviewed by me today-none today.  Echocardiogram 01/23/2022  IMPRESSIONS     1. Left ventricular ejection fraction, by estimation, is 30 to 35%. The  left ventricle has moderately decreased function. The left ventricle  demonstrates global hypokinesis. Left ventricular diastolic parameters are  consistent with Grade II diastolic  dysfunction (pseudonormalization).   2. Right ventricular systolic function is mildly reduced. The right  ventricular size is normal.   3. Left atrial size was mildly dilated.   4. Right atrial size was mildly dilated.   5. The mitral valve is normal in structure. Trivial mitral valve  regurgitation. No evidence of mitral stenosis.   6. The aortic valve is tricuspid. There is mild calcification of the  aortic valve. Aortic valve regurgitation is not visualized. No aortic  stenosis is present.   7. Aortic dilatation noted. There is borderline  dilatation of the  ascending aorta, measuring 38 mm.   8. The inferior vena cava is normal in size with greater than 50%  respiratory variability, suggesting right atrial pressure of 3 mmHg.   FINDINGS   Left Ventricle: Left ventricular ejection fraction, by estimation, is 30  to 35%. The left ventricle has moderately decreased function. The left  ventricle demonstrates global hypokinesis. Definity contrast agent was  given IV to delineate the left  ventricular endocardial borders. The left ventricular internal cavity size  was normal in size. There is no left ventricular hypertrophy. Left  ventricular diastolic parameters are consistent with Grade II diastolic  dysfunction (pseudonormalization).   Right Ventricle: The right ventricular size is normal. No increase in  right ventricular wall thickness. Right ventricular systolic function is  mildly reduced.   Left Atrium: Left atrial size was mildly dilated.   Right Atrium: Right atrial size was mildly dilated.   Pericardium: There is no evidence of pericardial effusion.   Mitral Valve: The mitral valve is normal in structure. Trivial mitral  valve regurgitation. No evidence of mitral valve stenosis.   Tricuspid Valve: The tricuspid valve is normal in structure. Tricuspid  valve regurgitation is not demonstrated. No evidence of tricuspid  stenosis.   Aortic Valve: The aortic valve is tricuspid. There is mild calcification  of the aortic valve. Aortic valve regurgitation is not visualized. No  aortic stenosis is present.   Pulmonic Valve: The pulmonic valve was not well visualized. Pulmonic valve  regurgitation is not visualized. No evidence of pulmonic stenosis.   Aorta: Aortic dilatation noted. There is borderline dilatation of the  ascending aorta, measuring 38 mm.   Venous: The inferior vena cava is normal in size with greater than 50%  respiratory variability, suggesting right atrial pressure of 3 mmHg.   IAS/Shunts:  No atrial level shunt detected by color flow Doppler.   Additional Comments: A device lead is visualized.   Assessment & Plan   1.  Chronic systolic CHF, ischemic cardiomyopathy-echocardiogram 01/23/2022 showed an EF of 30-35%, global hypokinesis, G2 DD and no significant valvular abnormalities.  Borderline dilation of ascending aorta measuring 38 mm was also noted. Continue furosemide, losartan, metoprolol, potassium Heart healthy low-sodium diet-reviewed Increase physical activity as tolerated Continue daily weights  and weight log Chair exercises given  Coronary artery disease-no chest pain today. Continue metoprolol, rosuvastatin Heart healthy low-sodium die Increase physical activity as tolerated   Essential hypertension-BP today 114/70 Continue losartan, metoprolol Heart healthy low-sodium diet-salty 6 given Increase physical activity as tolerated   Paroxysmal atrial fibrillation-heart rate today 72.  Denies recent episodes of irregular or accelerated heart rate.  Not candidate for anticoagulation due to recurrent falls/Parkinson's Continue metoprolol Avoid triggers caffeine, chocolate, EtOH, dehydration etc.  ICD-underwent ICD placement 11/15. Follows with EP  Follow-up with Dr. Stanford Breed or me in 4 -6 months.   Jossie Ng. Shontel Santee NP-C     01/31/2022, 10:53 AM [place] [address] Office [phone] Fax [phone]    I spent 14 minutes examining this patient, reviewing medications, and using patient centered shared decision making involving her cardiac care.  Prior to her visit I spent greater than 20 minutes reviewing her past medical history,  medications, and prior cardiac tests.

## 2022-01-31 ENCOUNTER — Telehealth: Payer: Self-pay | Admitting: Pharmacy Technician

## 2022-01-31 ENCOUNTER — Encounter: Payer: Self-pay | Admitting: General Practice

## 2022-01-31 ENCOUNTER — Ambulatory Visit: Payer: Medicare Other | Attending: General Practice | Admitting: General Practice

## 2022-01-31 ENCOUNTER — Other Ambulatory Visit (HOSPITAL_COMMUNITY): Payer: Self-pay

## 2022-01-31 VITALS — BP 114/70 | HR 72 | Ht 68.0 in | Wt 267.0 lb

## 2022-01-31 DIAGNOSIS — I2510 Atherosclerotic heart disease of native coronary artery without angina pectoris: Secondary | ICD-10-CM | POA: Diagnosis not present

## 2022-01-31 DIAGNOSIS — I10 Essential (primary) hypertension: Secondary | ICD-10-CM | POA: Diagnosis not present

## 2022-01-31 DIAGNOSIS — I5022 Chronic systolic (congestive) heart failure: Secondary | ICD-10-CM

## 2022-01-31 DIAGNOSIS — Z95810 Presence of automatic (implantable) cardiac defibrillator: Secondary | ICD-10-CM | POA: Diagnosis not present

## 2022-01-31 DIAGNOSIS — I255 Ischemic cardiomyopathy: Secondary | ICD-10-CM | POA: Diagnosis not present

## 2022-01-31 DIAGNOSIS — I480 Paroxysmal atrial fibrillation: Secondary | ICD-10-CM | POA: Diagnosis not present

## 2022-01-31 NOTE — Patient Instructions (Addendum)
Medication Instructions:  The current medical regimen is effective;  continue present plan and medications as directed. Please refer to the Current Medication list given to you today.  *If you need a refill on your cardiac medications before your next appointment, please call your pharmacy*  Lab Work: NONE If you have labs (blood work) drawn today and your tests are completely normal, you will receive your results only by:  Henry [name] (if you have MyChart) OR  A paper copy in the mail If you have any lab test that is abnormal or we need to change your treatment, we will call you to review the results.  Other Instructions TAKE AND LOG WEIGHT DAILY  Follow-Up: At [facility], you and your health needs are our priority.  As part of our continuing mission to provide you with exceptional heart care, we have created designated Provider Care Teams.  These Care Teams include your primary Cardiologist (physician) and Advanced Practice Providers (APPs -  Physician Assistants and Nurse Practitioners) who all work together to provide you with the care you need, when you need it.  Your next appointment:   4-6 month(s)  Provider:   Kirk Ruths, MD      Exercises to do While Sitting  Exercises that you do while sitting (chair exercises) can give you many of the same benefits as full exercise. Benefits include strengthening your heart, burning calories, and keeping muscles and joints healthy. Exercise can also improve your mood and help with depression and anxiety. You may benefit from chair exercises if you are unable to do standing exercises due to: Diabetic foot pain. Obesity. Illness. Arthritis. Recovery from surgery or injury. Breathing problems. Balance problems. Another type of disability. Before starting chair exercises, check with your health care provider or a physical therapist to find out how much exercise you can tolerate and which exercises are safe for you. If your  health care provider approves: Start out slowly and build up over time. Aim to work up to about 10-20 minutes for each exercise session. Make exercise part of your daily routine. Drink water when you exercise. Do not wait until you are thirsty. Drink every 10-15 minutes. Stop exercising right away if you have pain, nausea, shortness of breath, or dizziness. If you are exercising in a wheelchair, make sure to lock the wheels. Ask your health care provider whether you can do tai chi or yoga. Many positions in these mind-body exercises can be modified to do while seated. Warm-up Before starting other exercises: Sit up as straight as you can. Have your knees bent at 90 degrees, which is the shape of the capital letter "L." Keep your feet flat on the floor. Sit at the front edge of your chair, if you can. Pull in (tighten) the muscles in your abdomen and stretch your spine and neck as straight as you can. Hold this position for a few minutes. Breathe in and out evenly. Try to concentrate on your breathing, and relax your mind. Stretching Exercise A: Arm stretch Hold your arms out straight in front of your body. Bend your hands at the wrist with your fingers pointing up, as if signaling someone to stop. Notice the slight tension in your forearms as you hold the position. Keeping your arms out and your hands bent, rotate your hands outward as far as you can and hold this stretch. Aim to have your thumbs pointing up and your pinkie fingers pointing down. Slowly repeat arm stretches for one minute  as tolerated. Exercise B: Leg stretch If you can move your legs, try to "draw" letters on the floor with the toes of your foot. Write your name with one foot. Write your name with the toes of your other foot. Slowly repeat the movements for one minute as tolerated. Exercise C: Reach for the sky Reach your hands as far over your head as you can to stretch your spine. Move your hands and arms as if you are  climbing a rope. Slowly repeat the movements for one minute as tolerated. Range of motion exercises Exercise A: Shoulder roll Let your arms hang loosely at your sides. Lift just your shoulders up toward your ears, then let them relax back down. When your shoulders feel loose, rotate your shoulders in backward and forward circles. Do shoulder rolls slowly for one minute as tolerated. Exercise B: March in place As if you are marching, pump your arms and lift your legs up and down. Lift your knees as high as you can. If you are unable to lift your knees, just pump your arms and move your ankles and feet up and down. March in place for one minute as tolerated. Exercise C: Seated jumping jacks Let your arms hang down straight. Keeping your arms straight, lift them up over your head. Aim to point your fingers to the ceiling. While you lift your arms, straighten your legs and slide your heels along the floor to your sides, as wide as you can. As you bring your arms back down to your sides, slide your legs back together. If you are unable to use your legs, just move your arms. Slowly repeat seated jumping jacks for one minute as tolerated. Strengthening exercises Exercise A: Shoulder squeeze Hold your arms straight out from your body to your sides, with your elbows bent and your fists pointed at the ceiling. Keeping your arms in the bent position, move them forward so your elbows and forearms meet in front of your face. Open your arms back out as wide as you can with your elbows still bent, until you feel your shoulder blades squeezing together. Hold for 5 seconds. Slowly repeat the movements forward and backward for one minute as tolerated. Contact a health care provider if: You have to stop exercising due to any of the following: Pain. Nausea. Shortness of breath. Dizziness. Fatigue. You have significant pain or soreness after exercising. Get help right away if: You have chest pain. You  have difficulty breathing. These symptoms may represent a serious problem that is an emergency. Do not wait to see if the symptoms will go away. Get medical help right away. Call your local emergency services (911 in the [place] Do not drive yourself to the hospital. Summary Exercises that you do while sitting (chair exercises) can strengthen your heart, burn calories, and keep muscles and joints healthy. You may benefit from chair exercises if you are unable to do standing exercises due to diabetic foot pain, obesity, recovery from surgery or injury, or other conditions. Before starting chair exercises, check with your health care provider or a physical therapist to find out how much exercise you can tolerate and which exercises are safe for you. This information is not intended to replace advice given to you by your health care provider. Make sure you discuss any questions you have with your health care provider. Document Revised: 02/22/2020 Document Reviewed: 02/22/2020 Elsevier Patient Education  [place].

## 2022-01-31 NOTE — Telephone Encounter (Signed)
Pharmacy Patient Advocate Encounter   Received notification from pt/CMA that prior authorization for Omnipod 5 is due for renewal.   PA submitted on 01/31/22 [facility] - PA Case ID: [id number] Status is pending

## 2022-02-01 NOTE — Telephone Encounter (Signed)
WIFE WOULD LIKE THE CHAIR EXERCISES SENT TO THEM VIA MYCHART-DONE

## 2022-02-01 NOTE — Telephone Encounter (Signed)
Pharmacy Patient Advocate Encounter  Prior Authorization for Omnipod 5 G6 Pods has been approved.  (Renewal)  PA# PA Case ID: [id number] Effective dates: 01/09/22 through 01/31/23  He should be able to get a refill when he's ready. I'll send him a msg in [place].

## 2022-02-03 DIAGNOSIS — R3121 Asymptomatic microscopic hematuria: Secondary | ICD-10-CM | POA: Diagnosis not present

## 2022-02-08 NOTE — Progress Notes (Signed)
Remote ICD transmission.   

## 2022-02-09 ENCOUNTER — Ambulatory Visit: Payer: Medicare Other | Admitting: Cardiology

## 2022-02-09 DIAGNOSIS — R3121 Asymptomatic microscopic hematuria: Secondary | ICD-10-CM | POA: Diagnosis not present

## 2022-02-09 DIAGNOSIS — N200 Calculus of kidney: Secondary | ICD-10-CM | POA: Diagnosis not present

## 2022-02-09 DIAGNOSIS — N281 Cyst of kidney, acquired: Secondary | ICD-10-CM | POA: Diagnosis not present

## 2022-02-09 DIAGNOSIS — K8020 Calculus of gallbladder without cholecystitis without obstruction: Secondary | ICD-10-CM | POA: Diagnosis not present

## 2022-02-10 ENCOUNTER — Encounter: Payer: Self-pay | Admitting: Endocrinology

## 2022-02-10 ENCOUNTER — Encounter: Payer: Self-pay | Admitting: Neurology

## 2022-02-13 ENCOUNTER — Other Ambulatory Visit: Payer: Self-pay

## 2022-02-13 DIAGNOSIS — R443 Hallucinations, unspecified: Secondary | ICD-10-CM

## 2022-02-13 DIAGNOSIS — G20A1 Parkinson's disease without dyskinesia, without mention of fluctuations: Secondary | ICD-10-CM

## 2022-02-13 MED ORDER — NUPLAZID 34 MG PO CAPS: 1.0000 | ORAL_CAPSULE | Freq: Every day | ORAL | 1 refills | Status: DC

## 2022-02-13 NOTE — Telephone Encounter (Signed)
Called [place] and [place] about this and they are calling me back

## 2022-02-16 ENCOUNTER — Encounter (HOSPITAL_COMMUNITY): Payer: Self-pay | Admitting: *Deleted

## 2022-02-17 ENCOUNTER — Other Ambulatory Visit: Payer: Self-pay

## 2022-02-17 ENCOUNTER — Other Ambulatory Visit (HOSPITAL_COMMUNITY): Payer: Self-pay

## 2022-02-17 NOTE — Telephone Encounter (Signed)
Per test claim Vascepa is covered at this time.

## 2022-02-20 ENCOUNTER — Ambulatory Visit (INDEPENDENT_AMBULATORY_CARE_PROVIDER_SITE_OTHER)
Admission: RE | Admit: 2022-02-20 | Discharge: 2022-02-20 | Disposition: A | Discharge status: Home / Self Care | Payer: Medicare Other | Source: Ambulatory Visit | Attending: Internal Medicine | Admitting: Internal Medicine

## 2022-02-20 ENCOUNTER — Encounter: Payer: Self-pay | Admitting: Internal Medicine

## 2022-02-20 ENCOUNTER — Other Ambulatory Visit: Payer: Self-pay

## 2022-02-20 ENCOUNTER — Ambulatory Visit (INDEPENDENT_AMBULATORY_CARE_PROVIDER_SITE_OTHER): Payer: Medicare Other | Admitting: Internal Medicine

## 2022-02-20 VITALS — BP 110/80 | HR 63 | Temp 97.2°F | Ht 68.0 in | Wt 262.0 lb

## 2022-02-20 DIAGNOSIS — J22 Unspecified acute lower respiratory infection: Secondary | ICD-10-CM | POA: Diagnosis not present

## 2022-02-20 DIAGNOSIS — R0602 Shortness of breath: Secondary | ICD-10-CM | POA: Diagnosis not present

## 2022-02-20 DIAGNOSIS — J181 Lobar pneumonia, unspecified organism: Secondary | ICD-10-CM | POA: Diagnosis not present

## 2022-02-20 DIAGNOSIS — I255 Ischemic cardiomyopathy: Secondary | ICD-10-CM

## 2022-02-20 DIAGNOSIS — R062 Wheezing: Secondary | ICD-10-CM | POA: Diagnosis not present

## 2022-02-20 DIAGNOSIS — J9811 Atelectasis: Secondary | ICD-10-CM | POA: Diagnosis not present

## 2022-02-20 MED ORDER — PREDNISONE 20 MG PO TABS: 40.0000 mg | ORAL_TABLET | Freq: Every day | ORAL | 0 refills | Status: DC

## 2022-02-20 MED ORDER — ICOSAPENT ETHYL 1 G PO CAPS: 2.0000 g | ORAL_CAPSULE | Freq: Two times a day (BID) | ORAL | 1 refills | Status: DC

## 2022-02-20 MED ORDER — LEVOFLOXACIN 500 MG PO TABS: 500.0000 mg | ORAL_TABLET | Freq: Every day | ORAL | 0 refills | Status: DC

## 2022-02-20 NOTE — Progress Notes (Signed)
Subjective:    Patient ID: Henry [name], male    DOB: [birth year], 72 y.o.   MRN: [id number]  HPI Here with wife due to respiratory symptoms  Has been sick a week Bad wheezing Head and sinus congestion--and feels it in his lungs also Coughing up white stuff No fever Has some SOB Some sore throat---before the current symptoms Some numbness by ears but not painful  Just taking tylenol No history of asthma--but has wheezed before  Current Outpatient Medications on File Prior to Visit  Medication Sig Dispense Refill   acetaminophen (TYLENOL) 500 MG tablet Take 1,000 mg by mouth at bedtime as needed for mild pain or headache.      albuterol (VENTOLIN HFA) 108 (90 Base) MCG/ACT inhaler Inhale 2 puffs into the lungs every 6 (six) hours as needed for wheezing or shortness of breath. 18 g 5   allopurinol (ZYLOPRIM) 100 MG tablet TAKE 1 & 1/2 TABLETS BY MOUTH DAILY 135 tablet 3   aspirin EC 81 MG tablet Take 1 tablet (81 mg total) by mouth daily. Swallow whole. 90 tablet 3   carbidopa-levodopa (SINEMET CR) 50-200 MG tablet TAKE 1 TABLET BY MOUTH EVERYDAY AT BEDTIME 90 tablet 0   Carbidopa-Levodopa ER (SINEMET CR) 25-100 MG tablet controlled release TAKE 2 TABLETS BY MOUTH 5 TIMES PER DAY AS DIRECTED 900 tablet 0   Cholecalciferol (VITAMIN D) 50 MCG (2000 UT) tablet Take 2,000 Units by mouth daily.      clotrimazole (LOTRIMIN) 1 % cream Apply 1 Application topically 2 (two) times daily. 30 g 0   colchicine 0.6 MG tablet Take 1 tablet (0.6 mg total) by mouth daily as needed (gout).     Continuous Blood Gluc Receiver (DEXCOM G6 RECEIVER) DEVI Use to check blood sugar daily 1 each 0   Continuous Blood Gluc Sensor (DEXCOM G6 SENSOR) MISC Use to check blood sugar daily 9 each 3   Continuous Blood Gluc Transmit (DEXCOM G6 TRANSMITTER) MISC Change every 3 months 1 each 3   dorzolamide-timolol (COSOPT) 22.3-6.8 MG/ML ophthalmic solution Place 1 drop into the right eye 2 (two) times daily.      DULoxetine (CYMBALTA) 30 MG capsule TAKE 1 CAPSULE BY MOUTH EVERY DAY 90 capsule 1   fluticasone-salmeterol (ADVAIR) 250-50 MCG/ACT AEPB Inhale 1 puff into the lungs in the morning and at bedtime. Rinse after use. 180 each 3   FREESTYLE LITE test strip USE TO CHECK BLOOD SUGAR 4 TIMES DAILY. 150 strip 3   furosemide (LASIX) 80 MG tablet Take 1 tablet (80 mg total) by mouth daily. With extra tab a day if AM weight is above 263 lbs.     HUMALOG 100 UNIT/ML injection USE MAXIMUM 76 UNITS PER   DAY WITH V-GO PUMP 60 mL 3   icosapent Ethyl (VASCEPA) 1 g capsule Take 2 capsules (2 g total) by mouth 2 (two) times daily. 120 capsule 1   Insulin Disposable Pump (OMNIPOD 5 G6 INTRO, GEN 5,) KIT 1 kit by Does not apply route daily. 1 kit 0   Insulin Disposable Pump (OMNIPOD 5 G6 POD, GEN 5,) MISC CHANGE POD EVERY 3 DAYS 10 each 3   KLOR-CON M20 20 MEQ tablet TAKE 3 TABLETS (60 MEQ) BY MOUTH DAILY. 270 tablet 1   levothyroxine (SYNTHROID) 137 MCG tablet TAKE 1 TABLET BY MOUTH EVERY DAY BEFORE BREAKFAST 90 tablet 1   losartan (COZAAR) 25 MG tablet Take 1 tablet (25 mg total) by mouth daily. Please keep scheduled appointment  90 tablet 0   metoprolol succinate (TOPROL-XL) 50 MG 24 hr tablet TAKE 1 TABLET BY MOUTH EVERY DAY WITH OR IMMEDIATELY FOLLOWING A MEAL 90 tablet 1   omega-3 acid ethyl esters (LOVAZA) 1 g capsule TAKE 2 CAPSULES BY MOUTH TWICE A DAY 360 capsule 1   OVER THE COUNTER MEDICATION Apply 1 application topically daily as needed (pain). Theraworx Pain Cream     Pimavanserin Tartrate (NUPLAZID) 34 MG CAPS Samples of this drug were given to the patient, quantity 5, Lot Number [id number] Exp 05/2022 30 capsule 0   Pimavanserin Tartrate (NUPLAZID) 34 MG CAPS Take 1 capsule (34 mg total) by mouth daily. 90 capsule 1   polyethylene glycol (MIRALAX / GLYCOLAX) packet Take 8.5 g by mouth daily.     rosuvastatin (CRESTOR) 20 MG tablet TAKE 1 TABLET BY MOUTH EVERYDAY AT BEDTIME 90 tablet 1   tirzepatide  (MOUNJARO) 5 MG/0.5ML Pen Inject 5 mg into the skin once a week. 6 mL 1   traMADol (ULTRAM) 50 MG tablet TAKE 1 TABLET BY MOUTH TWICE A DAY 60 tablet 2   vitamin B-12 (CYANOCOBALAMIN) 1000 MCG tablet Take 1,000 mcg by mouth daily.     nitroGLYCERIN (NITROSTAT) 0.4 MG SL tablet Place 1 tablet (0.4 mg total) under the tongue every 5 (five) minutes as needed for chest pain. 25 tablet 12   No current facility-administered medications on file prior to visit.    Allergies  Allergen Reactions   Penicillins Anaphylaxis and Rash    Because of a history of documented adverse serious drug reaction;Medi Alert bracelet  is recommended PATIENT HAS HAD A PCN REACTION WITH IMMEDIATE RASH, FACIAL/TONGUE/THROAT SWELLING, SOB, OR LIGHTHEADEDNESS WITH HYPOTENSION:  #  #  YES  #  #  Has patient had a PCN reaction causing severe rash involving mucus membranes or skin necrosis: unknown Has patient had a PCN reaction that required hospitalization NO Has patient had a PCN reaction occurring within the last 10 years: NO   Klonopin [Clonazepam] Other (See Comments)    agitation   Peanut-Containing Drug Products Cough   Watermelon [Citrullus Vulgaris] Other (See Comments) and Cough    Tickle in throat    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)   DM (diabetes mellitus) ([place])    TYPE 2 - insulin pump   Dysrhythmia    a-fib   GERD (gastroesophageal reflux disease)    Glaucoma    right eye   History of kidney stones    multiple   HLD (hyperlipidemia)    HTN (hypertension)    pt denies 08/19/12   Hyperplasia, prostate    Hypothyroidism    MI (myocardial  infarction) ([place])    Dr Stanford Breed 2000, x3vessels bypass   OSA (obstructive sleep apnea)    AHI-28,on CPAP, noncompliant with CPAP   Parkinson disease    1999   PONV (postoperative nausea and vomiting)    Restless legs    Shortness of breath    Hx: of at all times   UTI (lower  urinary tract infection) 09/15/12   Klebsiella   Ventral hernia    Walker as ambulation aid    also uses wheelchair at home/when going out    Past Surgical History:  Procedure Laterality Date   [place]   right total loss   BIOPSY  10/14/2018   Procedure: BIOPSY;  Surgeon: Jerene Bears, MD;  Location: [facility];  Service: Gastroenterology;;   CARDIAC CATHETERIZATION     CATARACT EXTRACTION W/ INTRAOCULAR LENS IMPLANT     Hx: of right eye   CATARACT EXTRACTION W/ INTRAOCULAR LENS IMPLANT Left 2018   COLONOSCOPY N/A 10/13/2014   Procedure: COLONOSCOPY;  Surgeon: Jerene Bears, MD;  Location: [facility];  Service: Gastroenterology;  Laterality: N/A;   COLONOSCOPY W/ BIOPSIES AND POLYPECTOMY     Hx: of   COLONOSCOPY WITH PROPOFOL N/A 10/14/2018   Procedure: COLONOSCOPY WITH PROPOFOL;  Surgeon: Jerene Bears, MD;  Location: [facility];  Service: Gastroenterology;  Laterality: N/A;   CORONARY ARTERY BYPASS GRAFT  2000   [name], MD   CORONARY STENT PLACEMENT  1998   DEEP BRAIN STIMULATOR PLACEMENT  2004   Right and left VIN stimulator placement (parkinsons)   EYE SURGERY     FINGER AMPUTATION     left pointer   IMPLANTABLE CARDIOVERTER DEFIBRILLATOR IMPLANT N/A 11/13/2013   Procedure: IMPLANTABLE CARDIOVERTER DEFIBRILLATOR IMPLANT;  Surgeon: Evans Lance, MD;  Location: [facility] CATH LAB;  Service: Cardiovascular;  Laterality: N/A;   INSERT / REPLACE / REMOVE PACEMAKER     medtronic   LEFT AND RIGHT HEART CATHETERIZATION WITH CORONARY ANGIOGRAM N/A 09/24/2013   Procedure: LEFT AND RIGHT HEART CATHETERIZATION WITH CORONARY ANGIOGRAM;  Surgeon: Burnell Blanks, MD;   Location[facility] C[facility] LAB;  Service: Cardiovascular;  Laterality: N/A;   LITHOTRIPSY     3 different times   MEDIAN STERNOTOMY  2000   POLYPECTOMY  10/14/2018   Procedure: POLYPECTOMY;  Surgeon: Jerene Bears, MD;  Location: [facility];  Service: Gastroenterology;;   PULSE GENERATOR IMPLANT Right 11/13/2017   Procedure: Right chest implantable pulse generator change;  Surgeon: Erline Levine, MD;  Location: [place];  Service: Neurosurgery;  Laterality: Right;  Right chest implantable pulse generator change   SUBTHALAMIC STIMULATOR BATTERY REPLACEMENT N/A 09/05/2012   Procedure: Deep brain stimulator battery change;  Surgeon: Erline Levine, MD;  Location: [place] [facility];  Service: Neurosurgery;  Laterality: N/A;  Deep brain stimulator battery change   SUBTHALAMIC STIMULATOR BATTERY REPLACEMENT N/A 06/10/2015   Procedure: Deep Brain stimulator battery change;  Surgeon: Erline Levine, MD;  Location: [place] [facility];  Service: Neurosurgery;  Laterality: N/A;   SUBTHALAMIC STIMULATOR BATTERY REPLACEMENT Right 09/25/2019   Procedure: Deep brain stimulator battery change;  Surgeon: Erline Levine, MD;  Location: [place];  Service: Neurosurgery;  Laterality: Right;   TONSILLECTOMY      Family History  Problem Relation Age of Onset   Aneurysm Mother    Alcoholism Father    HIV/AIDS Brother 53       AIDS   Healthy Sister    Healthy Child    Peripheral vascular disease Other    Arthritis Other    Healthy Sister    Healthy Child    Healthy Child    Diabetes Neg Hx    Heart disease Neg Hx    Colon cancer Neg Hx    Prostate cancer Neg Hx     Social History   Socioeconomic History  Marital status: Married    Spouse name: [name]   Number of children: 2   Years of education: 12   Highest education level: High school graduate  Occupational History   Occupation: DISABLED    Comment: [employer], [employer]  Tobacco Use   Smoking status: Never    Passive exposure: Never   Smokeless tobacco: Never  Vaping  Use   Vaping Use: Never used  Substance and Sexual Activity   Alcohol use: Not Currently   Drug use: No   Sexual activity: Not Currently  Other Topics Concern   Not on file  Social History Narrative   From [place]   Retired/disability Clinical research associate   Likes to fish.     Married 1972   3 kids   [place] fan   Social Determinants of Health   Financial Resource Strain: Low Risk  (12/23/2020)   Overall Financial Resource Strain (CARDIA)    Difficulty of Paying Living Expenses: Not hard at all  Food Insecurity: No Food Insecurity (12/28/2021)   Hunger Vital Sign    Worried About Running Out of Food in the Last Year: Never true    Ran Out of Food in the Last Year: Never true  Transportation Needs: No Transportation Needs (12/28/2021)   PRAPARE - Hydrologist (Medical): No    Lack of Transportation (Non-Medical): No  Physical Activity: Inactive (12/28/2021)   Exercise Vital Sign    Days of Exercise per Week: 0 days    Minutes of Exercise per Session: 0 min  Stress: No Stress Concern Present (12/28/2021)   [place]    Feeling of Stress : Not at all  Social Connections: [place] (12/28/2021)   Social Connection and Isolation Panel [NHANES]    Frequency of Communication with Friends and Family: More than three times a week    Frequency of Social Gatherings with Friends and Family: More than three times a week    Attends Religious Services: More than 4 times per year    Active Member of [employer] or Organizations: Yes    Attends Music therapist: More than 4 times per year    Marital Status: Married  Human resources officer Violence: Not At Risk (12/28/2021)   Humiliation, Afraid, Rape, and Kick questionnaire    Fear of Current or Ex-Partner: No    Emotionally Abused: No    Physically Abused: No    Sexually Abused: No   Review of Systems Never  smoker Didn't test for COVID Finished antibiotic for strep about 3 weeks ago    Objective:   Physical Exam Constitutional:      Comments: Audible wheezing--?more upper airway  HENT:     Left Ear: Tympanic membrane and ear canal normal.     Ears:     Comments: Cerumen on right    Mouth/Throat:     Pharynx: No oropharyngeal exudate or posterior oropharyngeal erythema.  Pulmonary:     Comments: Decreased breath sounds Not overly tight Referred expiratory wheezing---from airway? Neurological:     Mental Status: He is alert.            Assessment & Plan:

## 2022-02-20 NOTE — Assessment & Plan Note (Signed)
RLL infiltrate vs atelectasis Will give levaquin 500 daily x 7 days Will add brief course of prednisone 40 x 3, 20 x 3

## 2022-02-20 NOTE — Assessment & Plan Note (Signed)
Persisting for a week Loud wheezing--hard to tell what part of airway---doesn't seem to be from the lungs Will check CXR

## 2022-02-23 ENCOUNTER — Other Ambulatory Visit: Payer: Self-pay | Admitting: Endocrinology

## 2022-02-25 ENCOUNTER — Other Ambulatory Visit: Payer: Self-pay | Admitting: Neurology

## 2022-02-26 ENCOUNTER — Telehealth: Payer: Self-pay | Admitting: Family Medicine

## 2022-02-26 DIAGNOSIS — R9389 Abnormal findings on diagnostic imaging of other specified body structures: Secondary | ICD-10-CM

## 2022-02-26 NOTE — Telephone Encounter (Signed)
Please get update on patient and set follow-up chest x-ray for mid March.  I put in the order.  Thanks.

## 2022-02-27 NOTE — Telephone Encounter (Signed)
Patient is coming in tomorrow for f/u on his cold.

## 2022-02-28 ENCOUNTER — Encounter: Payer: Self-pay | Admitting: Endocrinology

## 2022-02-28 ENCOUNTER — Ambulatory Visit: Payer: Medicare Other | Admitting: Family Medicine

## 2022-02-28 DIAGNOSIS — E1165 Type 2 diabetes mellitus with hyperglycemia: Secondary | ICD-10-CM

## 2022-02-28 MED ORDER — INSULIN LISPRO 100 UNIT/ML IJ SOLN: INTRAMUSCULAR | 3 refills | Status: DC

## 2022-03-01 ENCOUNTER — Other Ambulatory Visit: Payer: Self-pay | Admitting: Family Medicine

## 2022-03-01 DIAGNOSIS — I509 Heart failure, unspecified: Secondary | ICD-10-CM

## 2022-03-03 ENCOUNTER — Encounter: Payer: Self-pay | Admitting: Family Medicine

## 2022-03-03 ENCOUNTER — Ambulatory Visit (INDEPENDENT_AMBULATORY_CARE_PROVIDER_SITE_OTHER): Payer: Medicare Other | Admitting: Family Medicine

## 2022-03-03 VITALS — BP 112/68 | HR 57 | Temp 97.6°F | Ht 68.0 in | Wt 261.0 lb

## 2022-03-03 DIAGNOSIS — R059 Cough, unspecified: Secondary | ICD-10-CM

## 2022-03-03 DIAGNOSIS — K862 Cyst of pancreas: Secondary | ICD-10-CM

## 2022-03-03 DIAGNOSIS — I255 Ischemic cardiomyopathy: Secondary | ICD-10-CM | POA: Diagnosis not present

## 2022-03-03 NOTE — Patient Instructions (Signed)
Recheck CXR in about 1 month. You don't need an appointment.   We can recheck the pancreatic cyst in about 2 year.   Update me as needed. Don't change your meds for now.  Take care.  Glad to see you.

## 2022-03-03 NOTE — Progress Notes (Signed)
Wheeze is better.  Breathing is better.  Done with prednisone and abx.  No fevers.  Some sputum, white.  Sugars were higher on prednisone, up to 150-170s.  No severe elevations.     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  Needs recheck CT pancreas with and without contrast in 2 years, ie 02/2024.

## 2022-03-05 DIAGNOSIS — K862 Cyst of pancreas: Secondary | ICD-10-CM | POA: Insufficient documentation

## 2022-03-05 NOTE — Assessment & Plan Note (Signed)
Clearly improved.  Lungs are clear. Recheck CXR in about 1 month. Update me as needed.  No change in meds for now.

## 2022-03-05 NOTE — Assessment & Plan Note (Signed)
We can recheck the pancreatic cyst in about 2 year with follow-up CT.

## 2022-03-06 DIAGNOSIS — H04123 Dry eye syndrome of bilateral lacrimal glands: Secondary | ICD-10-CM | POA: Diagnosis not present

## 2022-03-06 DIAGNOSIS — H35033 Hypertensive retinopathy, bilateral: Secondary | ICD-10-CM | POA: Diagnosis not present

## 2022-03-06 DIAGNOSIS — E119 Type 2 diabetes mellitus without complications: Secondary | ICD-10-CM | POA: Diagnosis not present

## 2022-03-06 DIAGNOSIS — H4031X3 Glaucoma secondary to eye trauma, right eye, severe stage: Secondary | ICD-10-CM | POA: Diagnosis not present

## 2022-03-06 DIAGNOSIS — H40032 Anatomical narrow angle, left eye: Secondary | ICD-10-CM | POA: Diagnosis not present

## 2022-03-06 DIAGNOSIS — H35371 Puckering of macula, right eye: Secondary | ICD-10-CM | POA: Diagnosis not present

## 2022-03-06 DIAGNOSIS — H35341 Macular cyst, hole, or pseudohole, right eye: Secondary | ICD-10-CM | POA: Diagnosis not present

## 2022-03-06 LAB — HM DIABETES EYE EXAM

## 2022-03-19 ENCOUNTER — Other Ambulatory Visit: Payer: Self-pay | Admitting: Endocrinology

## 2022-03-19 DIAGNOSIS — E1165 Type 2 diabetes mellitus with hyperglycemia: Secondary | ICD-10-CM

## 2022-03-20 DIAGNOSIS — N200 Calculus of kidney: Secondary | ICD-10-CM | POA: Diagnosis not present

## 2022-03-20 DIAGNOSIS — R3121 Asymptomatic microscopic hematuria: Secondary | ICD-10-CM | POA: Diagnosis not present

## 2022-03-20 DIAGNOSIS — R932 Abnormal findings on diagnostic imaging of liver and biliary tract: Secondary | ICD-10-CM | POA: Diagnosis not present

## 2022-03-22 ENCOUNTER — Other Ambulatory Visit: Payer: Self-pay | Admitting: Cardiology

## 2022-03-22 DIAGNOSIS — I5022 Chronic systolic (congestive) heart failure: Secondary | ICD-10-CM

## 2022-03-22 DIAGNOSIS — I2510 Atherosclerotic heart disease of native coronary artery without angina pectoris: Secondary | ICD-10-CM

## 2022-03-23 ENCOUNTER — Other Ambulatory Visit (HOSPITAL_COMMUNITY): Payer: Self-pay

## 2022-03-29 ENCOUNTER — Encounter: Payer: Self-pay | Admitting: Endocrinology

## 2022-03-29 ENCOUNTER — Ambulatory Visit (INDEPENDENT_AMBULATORY_CARE_PROVIDER_SITE_OTHER): Payer: Medicare Other | Admitting: Endocrinology

## 2022-03-29 VITALS — BP 112/84 | HR 47 | Ht 68.0 in | Wt 264.2 lb

## 2022-03-29 DIAGNOSIS — E890 Postprocedural hypothyroidism: Secondary | ICD-10-CM

## 2022-03-29 DIAGNOSIS — E1142 Type 2 diabetes mellitus with diabetic polyneuropathy: Secondary | ICD-10-CM

## 2022-03-29 DIAGNOSIS — E1165 Type 2 diabetes mellitus with hyperglycemia: Secondary | ICD-10-CM

## 2022-03-29 DIAGNOSIS — Z794 Long term (current) use of insulin: Secondary | ICD-10-CM

## 2022-03-29 DIAGNOSIS — E782 Mixed hyperlipidemia: Secondary | ICD-10-CM

## 2022-03-29 LAB — POCT GLYCOSYLATED HEMOGLOBIN (HGB A1C): Hemoglobin A1C: 6.6 % — AB (ref 4.0–5.6)

## 2022-03-29 MED ORDER — TRIAMCINOLONE ACETONIDE 0.1 % EX CREA: 1.0000 | TOPICAL_CREAM | Freq: Every day | CUTANEOUS | 0 refills | Status: DC

## 2022-03-29 NOTE — Progress Notes (Signed)
Patient ID: Henry [name], male   DOB: 02-07-1950, 72 y.o.   MRN: [id number]           Reason for Appointment: Follow-up for Type 2 Diabetes   History of Present Illness:          Diagnosis: Type 2 diabetes mellitus, date of diagnosis: 2000       Recent history:    INSULIN regimen: Omnipod insulin pump regional with Humalog insulin   Basal rate program settings: 12 AM = 1.3, 3:30 AM = 1.6, 6 AM = 2.0, 12 PM = 2 point 5 and 10 PM = 2.2  Boluses 3-5 units for breakfast, 4-6 lunch and 6-7 units for dinner, 2-3 units for snacks Sensitivity 1: 60 carb ratio 1:10, target 120  Non-insulin hypoglycemic drugs the patient is taking are: Mounjaro 5 mg weekly  His A1c is 6.6 Again recent GMI 6.9 on the Dexcom  Recent total daily insulin usage is about 57 units  Current management, blood sugar patterns and problems identified: He has taken Mounjaro without any nausea for some time However has not lost any weight since his last visit He still thinks that he is trying to eat healthy meals and small portions  Also trying to bolus at meals although sometimes has only 1 bolus a day His wife is helping him with the boluses and he thinks he is trying to get him to bolus at the start of the meal Generally entering about 45 to 65 g of carbohydrate for meals and more for the evening meal usually His phone does not have the Dexcom app and he is not able to use the OmniPod 5 system as before No hypoglycemia Has been unable to exercise and single walker   He has seen the dietitian in 04/2017  Not able to exercise because of back pain and difficulty walking       Side effects from medications have been: None Compliance with the medical regimen: Fair   Interpretation of the Dexcom download from the last 2 weeks is as follows  Overnight blood sugars are mildly increased with average about 140 but occasionally may be in the low 100s or slightly lower at times  Premeal blood sugars are also  similar throughout the day with some rise in the afternoons Postprandial readings appear to be generally fairly well-controlled with only sporadic increase midday or in the evenings above 180 Blood sugar data was not present on 3/7 and 3/8 Highest average blood sugar is 179 between 9-10 PM  No hypoglycemia seen  CGM use % of time 99  2-week average/GV 151/19  Time in range       85 %  % Time Above 180 14  % Time above 250   % Time Below 70      Previously:  CGM use % of time 100  2-week average/GV 152/21  Time in range       82%  % Time Above 180 18  % Time above 250   % Time Below 70 0    Meals: 9 am , 2-3 pm and 6-7 pm            Dietician visit, most recent: 4/19               Weight history:   Wt Readings from Last 3 Encounters:  03/29/22 264 lb 3.2 oz (119.8 kg)  03/03/22 261 lb (118.4 kg)  02/20/22 262 lb (118.8 kg)    Glycemic  control:   Lab Results  Component Value Date   HGBA1C 6.3 (A) 12/27/2021   HGBA1C 6.8 (H) 08/31/2021   HGBA1C 6.6 (H) 05/31/2021   Lab Results  Component Value Date   MICROALBUR <0.7 02/04/2021   LDLCALC 53 12/27/2021   CREATININE 1.20 12/20/2021    Lab Results  Component Value Date   FRUCTOSAMINE 359 (H) 07/01/2019   FRUCTOSAMINE 277 11/09/2017   FRUCTOSAMINE 350 (H) 03/29/2017    Past history:   He has been taking Amaryl for several years and probably metformin since onset also At some point he was changed from metformin to [place] and was taking this since at least 2012 A1c had been higher in 2015 Since his A1c had been progressively higher with his regimen of Janumet and Amaryl he was started on Victoza in 01/2014 He was started on Lantus insulin in 5/16 because of persistent hyperglycemia especially fasting; was having readings as high as 293 Because of tendency to high postprandial readings and for control with Lantus he was switched to the V-go pump in  09/2014   Other active problems: See review of  systems    Allergies as of 03/29/2022       Reactions   Penicillins Anaphylaxis, Rash   Because of a history of documented adverse serious drug reaction;Medi Alert bracelet  is recommended PATIENT HAS HAD A PCN REACTION WITH IMMEDIATE RASH, FACIAL/TONGUE/THROAT SWELLING, SOB, OR LIGHTHEADEDNESS WITH HYPOTENSION:  #  #  YES  #  #  Has patient had a PCN reaction causing severe rash involving mucus membranes or skin necrosis: unknown Has patient had a PCN reaction that required hospitalization NO Has patient had a PCN reaction occurring within the last 10 years: NO   Klonopin [clonazepam] Other (See Comments)   agitation   Peanut-containing Drug Products Cough   Watermelon [citrullus Vulgaris] Other (See Comments), Cough   Tickle in throat        Medication List        Accurate as of March 29, 2022 11:44 AM. If you have any questions, ask your nurse or doctor.          acetaminophen 500 MG tablet Commonly known as: TYLENOL Take 1,000 mg by mouth at bedtime as needed for mild pain or headache.   albuterol 108 (90 Base) MCG/ACT inhaler Commonly known as: VENTOLIN HFA Inhale 2 puffs into the lungs every 6 (six) hours as needed for wheezing or shortness of breath.   allopurinol 100 MG tablet Commonly known as: ZYLOPRIM TAKE 1 & 1/2 TABLETS BY MOUTH DAILY   aspirin EC 81 MG tablet Take 1 tablet (81 mg total) by mouth daily. Swallow whole.   carbidopa-levodopa 50-200 MG tablet Commonly known as: SINEMET CR TAKE 1 TABLET BY MOUTH EVERYDAY AT BEDTIME   Carbidopa-Levodopa ER 25-100 MG tablet controlled release Commonly known as: SINEMET CR TAKE 2 TABLETS BY MOUTH 5 TIMES PER DAY AS DIRECTED   clotrimazole 1 % cream Commonly known as: LOTRIMIN Apply 1 Application topically 2 (two) times daily.   colchicine 0.6 MG tablet Take 1 tablet (0.6 mg total) by mouth daily as needed (gout).   cyanocobalamin 1000 MCG tablet Commonly known as: VITAMIN B12 Take 1,000 mcg by mouth  daily.   Dexcom G6 Receiver Devi Use to check blood sugar daily   Dexcom G6 Sensor Misc Use to check blood sugar daily   Dexcom G6 Transmitter Misc Change every 3 months   dorzolamide-timolol 2-0.5 % ophthalmic solution Commonly known  as: COSOPT Place 1 drop into the right eye 2 (two) times daily.   DULoxetine 30 MG capsule Commonly known as: CYMBALTA TAKE 1 CAPSULE BY MOUTH EVERY DAY   fluticasone-salmeterol 250-50 MCG/ACT Aepb Commonly known as: ADVAIR Inhale 1 puff into the lungs in the morning and at bedtime. Rinse after use.   FREESTYLE LITE test strip Generic drug: glucose blood USE TO CHECK BLOOD SUGAR 4 TIMES DAILY.   furosemide 80 MG tablet Commonly known as: LASIX Take 1 tablet (80 mg total) by mouth daily. With extra tab a day if AM weight is above 263 lbs.   icosapent Ethyl 1 g capsule Commonly known as: VASCEPA Take 2 capsules (2 g total) by mouth 2 (two) times daily.   insulin lispro 100 UNIT/ML injection Commonly known as: HumaLOG USE MAXIMUM 76 UNITS PER   DAY WITH V-GO PUMP   Klor-Con M20 20 MEQ tablet Generic drug: potassium chloride SA TAKE 3 TABLETS (60 MEQ) BY MOUTH DAILY.   levothyroxine 137 MCG tablet Commonly known as: SYNTHROID TAKE 1 TABLET BY MOUTH EVERY DAY BEFORE BREAKFAST   losartan 25 MG tablet Commonly known as: COZAAR TAKE 1 TABLET (25 MG TOTAL) BY MOUTH DAILY. PLEASE KEEP SCHEDULED APPOINTMENT   metoprolol succinate 50 MG 24 hr tablet Commonly known as: TOPROL-XL TAKE 1 TABLET BY MOUTH EVERY DAY WITH OR IMMEDIATELY FOLLOWING A MEAL   Mounjaro 5 MG/0.5ML Pen Generic drug: tirzepatide INJECT 5MG  SUBCUTANEOUSLY  ONCE WEEKLY   nitroGLYCERIN 0.4 MG SL tablet Commonly known as: NITROSTAT Place 1 tablet (0.4 mg total) under the tongue every 5 (five) minutes as needed for chest pain.   Nuplazid 34 MG Caps Generic drug: Pimavanserin Tartrate Samples of this drug were given to the patient, quantity 5, Lot Number [id number] Exp  05/2022   Nuplazid 34 MG Caps Generic drug: Pimavanserin Tartrate Take 1 capsule (34 mg total) by mouth daily.   omega-3 acid ethyl esters 1 g capsule Commonly known as: LOVAZA TAKE 2 CAPSULES BY MOUTH TWICE A DAY   Omnipod 5 G6 Intro (Gen 5) Kit 1 kit by Does not apply route daily.   Omnipod 5 G6 Pods (Gen 5) Misc CHANGE POD EVERY 3 DAYS   OVER THE COUNTER MEDICATION Apply 1 application topically daily as needed (pain). Theraworx Pain Cream   polyethylene glycol 17 g packet Commonly known as: MIRALAX / GLYCOLAX Take 8.5 g by mouth daily.   rosuvastatin 20 MG tablet Commonly known as: CRESTOR TAKE 1 TABLET BY MOUTH EVERYDAY AT BEDTIME   traMADol 50 MG tablet Commonly known as: ULTRAM TAKE 1 TABLET BY MOUTH TWICE A DAY   Vitamin D 50 MCG (2000 UT) tablet Take 2,000 Units by mouth daily.        Allergies:  Allergies  Allergen Reactions   Penicillins Anaphylaxis and Rash    Because of a history of documented adverse serious drug reaction;Medi Alert bracelet  is recommended PATIENT HAS HAD A PCN REACTION WITH IMMEDIATE RASH, FACIAL/TONGUE/THROAT SWELLING, SOB, OR LIGHTHEADEDNESS WITH HYPOTENSION:  #  #  YES  #  #  Has patient had a PCN reaction causing severe rash involving mucus membranes or skin necrosis: unknown Has patient had a PCN reaction that required hospitalization NO Has patient had a PCN reaction occurring within the last 10 years: NO   Klonopin [Clonazepam] Other (See Comments)    agitation   Peanut-Containing Drug Products Cough   Watermelon [Citrullus Vulgaris] Other (See Comments) and Cough    Tickle  in throat    Past Medical History:  Diagnosis Date   AICD (automatic cardioverter/defibrillator) present    Dr Lovena Le office visit yearly, MDT  medtronic    Arthritis    cane   Asthma    CAD (coronary artery disease)    Cardiomyopathy    Cataract    removed   Complication of anesthesia    pt states that he got a rash   Constipation    Deaf     right ear, 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)   DM (diabetes mellitus) ([place])    TYPE 2 - insulin pump   Dysrhythmia    a-fib   GERD (gastroesophageal reflux disease)    Glaucoma    right eye   History of kidney stones    multiple   HLD (hyperlipidemia)    HTN (hypertension)    pt denies 08/19/12   Hyperplasia, prostate    Hypothyroidism    MI (myocardial infarction) ([place])    Dr Crenshaw 2000, x3vessels bypass   OSA (obstructive sleep apnea)    AHI-28,on CPAP, noncompliant with CPAP   Parkinson disease    1999   PONV (postoperative nausea and vomiting)    Restless legs    Shortness of breath    Hx: of at all times   UTI (lower urinary tract infection) 09/15/12   Klebsiella   Ventral hernia    Walker as ambulation aid    also uses wheelchair at home/when going out    Past Surgical History:  Procedure Laterality Date   [place]   right total loss   BIOPSY  10/14/2018   Procedure: BIOPSY;  Surgeon: Jerene Bears, MD;  Location: [facility];  Service: Gastroenterology;;   CARDIAC CATHETERIZATION     CATARACT EXTRACTION W/ INTRAOCULAR LENS IMPLANT     Hx: of right eye   CATARACT EXTRACTION W/ INTRAOCULAR LENS IMPLANT Left 2018   COLONOSCOPY N/A 10/13/2014   Procedure: COLONOSCOPY;  Surgeon: Jerene Bears, MD;  Location: [facility];  Service: Gastroenterology;  Laterality: N/A;   COLONOSCOPY W/ BIOPSIES AND POLYPECTOMY     Hx: of   COLONOSCOPY WITH PROPOFOL N/A 10/14/2018   Procedure: COLONOSCOPY WITH PROPOFOL;  Surgeon: Jerene Bears, MD;  Location: [facility];  Service: Gastroenterology;  Laterality: N/A;   CORONARY ARTERY BYPASS GRAFT  2000   [name], MD   CORONARY STENT PLACEMENT  1998   DEEP BRAIN STIMULATOR PLACEMENT  2004   Right and left VIN stimulator placement (parkinsons)   EYE SURGERY     FINGER AMPUTATION     left pointer   IMPLANTABLE CARDIOVERTER DEFIBRILLATOR IMPLANT N/A 11/13/2013   Procedure: IMPLANTABLE  CARDIOVERTER DEFIBRILLATOR IMPLANT;  Surgeon: Evans Lance, MD;  Location: [facility] CATH LAB;  Service: Cardiovascular;  Laterality: N/A;   INSERT / REPLACE / REMOVE PACEMAKER     medtronic   LEFT AND RIGHT HEART CATHETERIZATION WITH CORONARY ANGIOGRAM N/A 09/24/2013   Procedure: LEFT AND RIGHT HEART CATHETERIZATION WITH CORONARY ANGIOGRAM;  Surgeon: Burnell Blanks, MD;  Location: [facility] CATH LAB;  Service: Cardiovascular;  Laterality: N/A;   LITHOTRIPSY     3 different times   MEDIAN STERNOTOMY  2000   POLYPECTOMY  10/14/2018   Procedure: POLYPECTOMY;  Surgeon: Jerene Bears, MD;  Location: [facility];  Service: Gastroenterology;;   PULSE GENERATOR IMPLANT Right 11/13/2017   Procedure: Right chest implantable pulse generator change;  Surgeon: Erline Levine, MD;  Location: [facility]  OR;  Service: Neurosurgery;  Laterality: Right;  Right chest implantable pulse generator change   SUBTHALAMIC STIMULATOR BATTERY REPLACEMENT N/A 09/05/2012   Procedure: Deep brain stimulator battery change;  Surgeon: Erline Levine, MD;  Location: [place] [facility];  Service: Neurosurgery;  Laterality: N/A;  Deep brain stimulator battery change   SUBTHALAMIC STIMULATOR BATTERY REPLACEMENT N/A 06/10/2015   Procedure: Deep Brain stimulator battery change;  Surgeon: Erline Levine, MD;  Location: [place] [facility];  Service: Neurosurgery;  Laterality: N/A;   SUBTHALAMIC STIMULATOR BATTERY REPLACEMENT Right 09/25/2019   Procedure: Deep brain stimulator battery change;  Surgeon: Erline Levine, MD;  Location: [place];  Service: Neurosurgery;  Laterality: Right;   TONSILLECTOMY      Family History  Problem Relation Age of Onset   Aneurysm Mother    Alcoholism Father    HIV/AIDS Brother 31       AIDS   Healthy Sister    Healthy Child    Peripheral vascular disease Other    Arthritis Other    Healthy Sister    Healthy Child    Healthy Child    Diabetes Neg Hx    Heart disease Neg Hx    Colon cancer Neg Hx    Prostate cancer Neg Hx      Social History:  reports that he has never smoked. He has never been exposed to tobacco smoke. He has never used smokeless tobacco. He reports that he does not currently use alcohol. He reports that he does not use drugs.    Review of Systems     HYPERLIPIDEMIA:  He is on Crestor 20 mg with good control of LDL, has low HDL Triglycerides are most recently below 150, previously over 200, treated with Lovaza 2 g twice a day HDL last 36      Lab Results  Component Value Date   CHOL 115 12/27/2021   CHOL 103 05/31/2021   CHOL 110 06/24/2020   Lab Results  Component Value Date   HDL 36.20 (L) 12/27/2021   HDL 29.10 (L) 05/31/2021   HDL 32.30 (L) 06/24/2020   Lab Results  Component Value Date   LDLCALC 53 12/27/2021   LDLCALC 39 05/31/2021   LDLCALC 47 06/24/2020   Lab Results  Component Value Date   TRIG 128.0 12/27/2021   TRIG 172.0 (H) 05/31/2021   TRIG 150.0 (H) 06/24/2020   Lab Results  Component Value Date   CHOLHDL 3 12/27/2021   CHOLHDL 4 05/31/2021   CHOLHDL 3 06/24/2020   Lab Results  Component Value Date   LDLDIRECT 51.0 04/24/2019   LDLDIRECT 54.0 09/18/2018   LDLDIRECT 67.0 03/12/2018              Lab Results  Component Value Date   ALT 5 12/27/2021        HYPOTHYROIDISM:   He has had hypothyroidism following radioactive iodine treatment for hyperthyroidism in 2013  Last TSH  normal with taking 137 mcg levothyroxine, 8 tablets a week   Lab Results  Component Value Date   TSH 2.70 12/27/2021   TSH 5.58 (H) 08/31/2021   TSH 2.21 05/31/2021   FREET4 0.77 04/24/2019   FREET4 0.88 08/16/2018   FREET4 0.75 03/12/2018   Has chronic systolic CHF Prescribed 80 mg Lasix daily without metolazone  Blood pressure history:  BP Readings from Last 3 Encounters:  03/29/22 112/84  03/03/22 112/68  02/20/22 110/80    CKD: Has variable creatinine levels as follows: Has been followed by nephrologist,  [place]ade was stopped when he had UTI in  5/22 No   microalbuminuria   He was prescribed LOSARTAN 25 mg by his cardiologist   Lab Results  Component Value Date   CREATININE 1.20 12/20/2021   CREATININE 1.29 11/25/2021   CREATININE 1.46 08/31/2021    Neuropathy: Taking gabapentin bid from his PCP. Has history of numbness in his toes  Diabetic foot exam in 9/22   Physical Examination:  BP 112/84 (BP Location: Left Arm, Patient Position: Sitting, Cuff Size: Normal)   Pulse (!) 47   Ht 5\' 8"  (1.727 m)   Wt 264 lb 3.2 oz (119.8 kg)   SpO2 97%   BMI 40.17 kg/m      ASSESSMENT/PLAN  Diabetes type 2, insulin-dependent with obesity     See history of present illness for detailed discussion of his current management, blood sugar patterns and problems identified  His A1c is consistently at target and now 6.6  He is using the Omni pod 5 insulin pump with the Dexcom but not in the automated mode since he is using the reader for the Dexcom Blood sugars are reasonably well-controlled with Mounjaro but he has not received weight loss Although he is trying to bolus at the start of the meal he may have missed a few boluses periodically However he does not have any consistently high postprandial readings  Recent GMI 6.9 Considering some variability in his blood sugars and need to avoid hypoglycemia will not increase his basal rates as yet  Recommendations: Continue 5 mg Mounjaro while he has been on his 47-month supply but at the end he can switch to 7.5 mg which may help reduce his insulin requirement and more weight loss Reminded him to bolus with every meal and preferably a few minutes before starting to eat  HYPOTHYROIDISM: TSH to be checked on the next visit  Mixed Hyperlipidemia: Previously well-controlled on Crestor and Lovaza  Neuropathy: Symptoms well-controlled, still has issues with his balance   There are no Patient Instructions on file for this visit.   Follow-up in 4 months    Elayne Snare 03/29/2022,  11:44 AM   Note: This office note was prepared with Dragon voice recognition system technology. Any transcriptional errors that result from this process are unintentional.

## 2022-03-29 NOTE — Patient Instructions (Addendum)
Call before refilling [facility]

## 2022-03-30 ENCOUNTER — Encounter: Payer: Self-pay | Admitting: Endocrinology

## 2022-03-31 ENCOUNTER — Telehealth: Payer: Self-pay | Admitting: Family Medicine

## 2022-03-31 DIAGNOSIS — K862 Cyst of pancreas: Secondary | ICD-10-CM

## 2022-03-31 NOTE — Telephone Encounter (Signed)
Opened in error

## 2022-04-03 ENCOUNTER — Other Ambulatory Visit: Payer: Medicare Other

## 2022-04-03 ENCOUNTER — Ambulatory Visit (INDEPENDENT_AMBULATORY_CARE_PROVIDER_SITE_OTHER)
Admission: RE | Admit: 2022-04-03 | Discharge: 2022-04-03 | Disposition: A | Discharge status: Home / Self Care | Payer: Medicare Other | Source: Ambulatory Visit | Attending: Family Medicine | Admitting: Family Medicine

## 2022-04-03 DIAGNOSIS — R9389 Abnormal findings on diagnostic imaging of other specified body structures: Secondary | ICD-10-CM | POA: Diagnosis not present

## 2022-04-06 ENCOUNTER — Encounter: Payer: Self-pay | Admitting: *Deleted

## 2022-04-06 ENCOUNTER — Other Ambulatory Visit: Payer: Self-pay | Admitting: Family Medicine

## 2022-04-06 DIAGNOSIS — R9389 Abnormal findings on diagnostic imaging of other specified body structures: Secondary | ICD-10-CM

## 2022-04-10 ENCOUNTER — Other Ambulatory Visit: Payer: Self-pay | Admitting: Neurology

## 2022-04-10 DIAGNOSIS — G20A1 Parkinson's disease without dyskinesia, without mention of fluctuations: Secondary | ICD-10-CM

## 2022-04-11 ENCOUNTER — Other Ambulatory Visit: Payer: Self-pay | Admitting: Family Medicine

## 2022-04-11 NOTE — Telephone Encounter (Signed)
Refill request for TRAMADOL HCL 50 MG TABLET   LOV - 03/03/22 Next OV - not scheduled Last refill - 01/14/22 #60/2

## 2022-04-19 ENCOUNTER — Ambulatory Visit (INDEPENDENT_AMBULATORY_CARE_PROVIDER_SITE_OTHER): Payer: Medicare Other

## 2022-04-19 DIAGNOSIS — I255 Ischemic cardiomyopathy: Secondary | ICD-10-CM

## 2022-04-19 LAB — CUP PACEART REMOTE DEVICE CHECK
Battery Remaining Longevity: 24 mo
Battery Voltage: 2.92 V
Brady Statistic AP VP Percent: 0.01 %
Brady Statistic AP VS Percent: 8.85 %
Brady Statistic AS VP Percent: 0.06 %
Brady Statistic AS VS Percent: 91.08 %
Brady Statistic RA Percent Paced: 8.82 %
Brady Statistic RV Percent Paced: 0.06 %
Date Time Interrogation Session: 20240410172828
HighPow Impedance: 69 Ohm
Implantable Lead Connection Status: 753985
Implantable Lead Connection Status: 753985
Implantable Lead Implant Date: 20151105
Implantable Lead Implant Date: 20151105
Implantable Lead Location: 753859
Implantable Lead Location: 753860
Implantable Lead Model: 5076
Implantable Pulse Generator Implant Date: 20151105
Lead Channel Impedance Value: 399 Ohm
Lead Channel Impedance Value: 437 Ohm
Lead Channel Impedance Value: 494 Ohm
Lead Channel Pacing Threshold Amplitude: 0.375 V
Lead Channel Pacing Threshold Amplitude: 1.125 V
Lead Channel Pacing Threshold Pulse Width: 0.4 ms
Lead Channel Pacing Threshold Pulse Width: 0.4 ms
Lead Channel Sensing Intrinsic Amplitude: 1 mV
Lead Channel Sensing Intrinsic Amplitude: 1 mV
Lead Channel Sensing Intrinsic Amplitude: 18.375 mV
Lead Channel Sensing Intrinsic Amplitude: 18.375 mV
Lead Channel Setting Pacing Amplitude: 2.25 V
Lead Channel Setting Pacing Amplitude: 2.5 V
Lead Channel Setting Pacing Pulse Width: 0.4 ms
Lead Channel Setting Sensing Sensitivity: 0.3 mV
Zone Setting Status: 755011

## 2022-04-24 ENCOUNTER — Other Ambulatory Visit: Payer: Self-pay | Admitting: Endocrinology

## 2022-04-25 ENCOUNTER — Other Ambulatory Visit (HOSPITAL_COMMUNITY): Payer: Self-pay

## 2022-05-01 ENCOUNTER — Telehealth: Payer: Self-pay

## 2022-05-01 NOTE — Progress Notes (Signed)
Care Management & Coordination Services Pharmacy Team  Reason for Encounter: Appointment Reminder  Contacted patient to confirm telephone  appointment with Al Corpus, PharmD on 05/04/2022 at 11:00.  Unsuccessful outreach. Left voicemail for patient to return call.  Star Rating Drugs:  Medication:  Last Fill: Day Supply Losartan 25 mg 03/23/2022 90 Rosuvastatin 20 mg 04/24/2022 90  Care Gaps: Annual wellness visit in last year? Yes 12/28/2021  If Diabetic: Last eye exam / retinopathy screening: Up to date Last diabetic foot exam: Overdue  Al Corpus, PharmD notified  Claudina Lick, [place] Clinical Pharmacy Assistant [phone]

## 2022-05-02 ENCOUNTER — Encounter: Payer: Self-pay | Admitting: Endocrinology

## 2022-05-03 ENCOUNTER — Other Ambulatory Visit: Payer: Self-pay

## 2022-05-03 MED ORDER — TIRZEPATIDE 7.5 MG/0.5ML ~~LOC~~ SOAJ: 7.5000 mg | SUBCUTANEOUS | 2 refills | Status: DC

## 2022-05-04 ENCOUNTER — Telehealth: Payer: Self-pay | Admitting: Pharmacist

## 2022-05-04 ENCOUNTER — Encounter: Payer: Medicare Other | Admitting: Pharmacist

## 2022-05-04 NOTE — Telephone Encounter (Signed)
Spoke with patient and wife about medications. Patient reports doing well with new Mounjaro (switched from ozempic a few months ago). He reports he is taking Vascepa, not Lovaza - updated med list. He endorses compliance with medications and denies any issues at this time.

## 2022-05-04 NOTE — Progress Notes (Unsigned)
Care Management & Coordination Services Pharmacy Note  05/04/2022 Name:  Henry [name] MRN:  [id number] DOB:  [birth year]  Summary: ***  Recommendations/Changes made from today's visit: ***  Follow up plan: ***   Subjective: Henry [name] is an 72 y.o. year old male who is a primary patient of Joaquim Nam, MD.  The care coordination team was consulted for assistance with disease management and care coordination needs.    Engaged with patient by telephone for follow up visit.  Recent office visits: 03/03/22 Dr Para March OV: f/u URI, improved.  Recent consult visits: 03/29/22 Dr Lucianne Muss (Endo): A1c 6.6%   01/31/22 NP Cleaver (Cardiology): HF- EF 30-35%;  Hospital visits: {Hospital DC Yes/No:25215}   Objective:  Lab Results  Component Value Date   CREATININE 1.20 12/20/2021   BUN 18 12/20/2021   GFR 56.25 (L) 11/25/2021   EGFR 65 12/20/2021   GFRNONAA 53 (L) 09/25/2019   GFRAA >60 09/25/2019   NA 143 12/20/2021   K 4.4 12/20/2021   CALCIUM 9.2 12/20/2021   CO2 31 (H) 12/20/2021   GLUCOSE 138 (H) 12/27/2021    Lab Results  Component Value Date/Time   HGBA1C 6.6 (A) 03/29/2022 11:46 AM   HGBA1C 6.3 (A) 12/27/2021 11:54 AM   HGBA1C 6.8 (H) 08/31/2021 09:29 AM   HGBA1C 6.6 (H) 05/31/2021 10:55 AM   FRUCTOSAMINE 359 (H) 07/01/2019 01:37 PM   FRUCTOSAMINE 277 11/09/2017 08:07 AM   GFR 56.25 (L) 11/25/2021 12:22 PM   GFR 48.57 (L) 08/31/2021 09:29 AM   MICROALBUR <0.7 02/04/2021 01:33 PM   MICROALBUR <0.7 12/19/2019 10:36 AM    Last diabetic Eye exam:  Lab Results  Component Value Date/Time   HMDIABEYEEXA No Retinopathy 03/06/2022 09:05 AM    Last diabetic Foot exam: No results found for: "HMDIABFOOTEX"   Lab Results  Component Value Date   CHOL 115 12/27/2021   HDL 36.20 (L) 12/27/2021   LDLCALC 53 12/27/2021   LDLDIRECT 51.0 04/24/2019   TRIG 128.0 12/27/2021   CHOLHDL 3 12/27/2021       Latest Ref Rng & Units 12/27/2021   12:04 PM 05/31/2021    10:55 AM 06/24/2020    8:55 AM  Hepatic Function  Total Protein 6.0 - 8.3 g/dL  6.5  6.3   Albumin 3.5 - 5.2 g/dL  4.2  4.1   AST 0 - 37 U/L  7  5   ALT 0 - 53 U/L Alk Phosphatase 39 - 117 U/L  72  87   Total Bilirubin 0.2 - 1.2 mg/dL  0.5  0.6     Lab Results  Component Value Date/Time   TSH 2.70 12/27/2021 12:04 PM   TSH 5.58 (H) 08/31/2021 09:29 AM   FREET4 0.77 04/24/2019 09:25 AM   FREET4 0.88 08/16/2018 09:16 AM       Latest Ref Rng & Units 05/24/2020    1:22 PM 09/25/2019    2:32 PM 08/19/2019    4:33 PM  CBC  WBC 4.0 - 10.5 K/uL 12.2  6.2  6.4   Hemoglobin 13.0 - 17.0 g/dL 40.9  81.1  91.4   Hematocrit 39.0 - 52.0 % 44.0  43.7  42.2   Platelets 150.0 - 400.0 K/uL 121.0  195  195     Lab Results  Component Value Date/Time   VD25OH 27.9 06/25/2017 12:00 AM   VITAMINB12 956 (H) 02/22/2017 01:35 PM   VITAMINB12 268 08/19/2012 03:53 PM  Clinical ASCVD: {YES/NO:21197} The ASCVD Risk score (Arnett DK, et al., 2019) failed to calculate for the following reasons:   The patient has a prior MI or stroke diagnosis    ***Other: (CHADS2VASc if Afib, MMRC or CAT for COPD, ACT, DEXA)     03/03/2022   12:30 PM 12/28/2021   12:43 PM 12/23/2020   11:25 AM  Depression screen PHQ 2/9  Decreased Interest 0 0 0  Down, Depressed, Hopeless 0 0 0  PHQ - 2 Score 0 0 0  Altered sleeping 0    Tired, decreased energy 0    Change in appetite 0    Feeling bad or failure about yourself  0    Trouble concentrating 0    Moving slowly or fidgety/restless 0    Suicidal thoughts 0    PHQ-9 Score 0    Difficult doing work/chores Not difficult at all       Social History   Tobacco Use  Smoking Status Never   Passive exposure: Never  Smokeless Tobacco Never   BP Readings from Last 3 Encounters:  03/29/22 112/84  03/03/22 112/68  02/20/22 110/80   Pulse Readings from Last 3 Encounters:  03/29/22 (!) 47  03/03/22 (!) 57  02/20/22 63   Wt Readings from Last 3  Encounters:  03/29/22 264 lb 3.2 oz (119.8 kg)  03/03/22 261 lb (118.4 kg)  02/20/22 262 lb (118.8 kg)   BMI Readings from Last 3 Encounters:  03/29/22 40.17 kg/m  03/03/22 39.68 kg/m  02/20/22 39.84 kg/m    Allergies  Allergen Reactions   Penicillins Anaphylaxis and Rash    Because of a history of documented adverse serious drug reaction;Medi Alert bracelet  is recommended PATIENT HAS HAD A PCN REACTION WITH IMMEDIATE RASH, FACIAL/TONGUE/THROAT SWELLING, SOB, OR LIGHTHEADEDNESS WITH HYPOTENSION:  #  #  YES  #  #  Has patient had a PCN reaction causing severe rash involving mucus membranes or skin necrosis: unknown Has patient had a PCN reaction that required hospitalization NO Has patient had a PCN reaction occurring within the last 10 years: NO   Klonopin [Clonazepam] Other (See Comments)    agitation   Peanut-Containing Drug Products Cough   Watermelon [Citrullus Vulgaris] Other (See Comments) and Cough    Tickle in throat    Medications Reviewed Today     Reviewed by Eden Lathe, CMA (Certified Medical Assistant) on 03/29/22 at 1124  Med List Status: <None>   Medication Order Taking? Sig Documenting Provider Last Dose Status Informant  acetaminophen (TYLENOL) 500 MG tablet [id number] Yes Take 1,000 mg by mouth at bedtime as needed for mild pain or headache.  [provider] Taking Active Spouse/Significant Other  albuterol (VENTOLIN HFA) 108 (90 Base) MCG/ACT inhaler [id number] Yes Inhale 2 puffs into the lungs every 6 (six) hours as needed for wheezing or shortness of breath. Joaquim Nam, MD Taking Active   allopurinol (ZYLOPRIM) 100 MG tablet [id number] Yes TAKE 1 & 1/2 TABLETS BY MOUTH DAILY Joaquim Nam, MD Taking Active   aspirin EC 81 MG tablet [id number] Yes Take 1 tablet (81 mg total) by mouth daily. Swallow whole. Lewayne Bunting, MD Taking Active   carbidopa-levodopa (SINEMET CR) 50-200 MG tablet [id number] Yes TAKE 1 TABLET BY MOUTH EVERYDAY  AT BEDTIME Tat, Octaviano Batty, DO Taking Active   Carbidopa-Levodopa ER (SINEMET CR) 25-100 MG tablet controlled release [id number] Yes TAKE 2 TABLETS BY MOUTH 5 TIMES PER DAY AS DIRECTED Tat, Octaviano Batty,  DO Taking Active   Cholecalciferol (VITAMIN D) 50 MCG (2000 UT) tablet [id number] Yes Take 2,000 Units by mouth daily.  [provider] Taking Active Spouse/Significant Other  clotrimazole (LOTRIMIN) 1 % cream [id number] Yes Apply 1 Application topically 2 (two) times daily. Eustaquio Boyden, MD Taking Active   colchicine 0.6 MG tablet [id number] Yes Take 1 tablet (0.6 mg total) by mouth daily as needed (gout). Joaquim Nam, MD Taking Active   Continuous Blood Gluc Receiver [facility] G6 [place]) DEVI [id number] Yes Use to check blood sugar daily Reather Littler, MD Taking Active   Continuous Blood Gluc Sensor (DEXCOM G6 SENSOR) MISC [id number] Yes Use to check blood sugar daily Reather Littler, MD Taking Active   Continuous Blood Gluc Transmit (DEXCOM G6 TRANSMITTER) MISC [id number] Yes Change every 3 months Reather Littler, MD Taking Active   dorzolamide-timolol (COSOPT) 22.3-6.8 MG/ML ophthalmic solution [id number] Yes Place 1 drop into the right eye 2 (two) times daily. [provider] Taking Active Spouse/Significant Other  DULoxetine (CYMBALTA) 30 MG capsule [id number] Yes TAKE 1 CAPSULE BY MOUTH EVERY DAY Tat, Octaviano Batty, DO Taking Active   fluticasone-salmeterol (ADVAIR) 250-50 MCG/ACT AEPB [id number] Yes Inhale 1 puff into the lungs in the morning and at bedtime. Rinse after use. Joaquim Nam, MD Taking Active   FREESTYLE LITE test strip [id number] Yes USE TO CHECK BLOOD SUGAR 4 TIMES DAILY. Reather Littler, MD Taking Active   furosemide (LASIX) 80 MG tablet [id number] Yes Take 1 tablet (80 mg total) by mouth daily. With extra tab a day if AM weight is above 263 lbs. Joaquim Nam, MD Taking Active   icosapent Ethyl (VASCEPA) 1 g capsule [id number] Yes Take 2 capsules (2 g total) by mouth 2 (two)  times daily. Reather Littler, MD Taking Active   Insulin Disposable Pump (OMNIPOD 5 G6 INTRO, GEN 5,) KIT [id number] Yes 1 kit by Does not apply route daily. Reather Littler, MD Taking Active   Insulin Disposable Pump (OMNIPOD 5 G6 PODS, GEN 5,) MISC [id number] Yes CHANGE POD EVERY 3 DAYS Reather Littler, MD Taking Active   insulin lispro (HUMALOG) 100 UNIT/ML injection [id number] Yes USE MAXIMUM 76 UNITS PER   DAY WITH V-GO PUMP Reather Littler, MD Taking Active   KLOR-CON M20 20 MEQ tablet [id number] Yes TAKE 3 TABLETS (60 MEQ) BY MOUTH DAILY. Joaquim Nam, MD Taking Active   levothyroxine (SYNTHROID) 137 MCG tablet [id number] Yes TAKE 1 TABLET BY MOUTH EVERY DAY BEFORE BREAKFAST Reather Littler, MD Taking Active   losartan (COZAAR) 25 MG tablet [id number] Yes TAKE 1 TABLET (25 MG TOTAL) BY MOUTH DAILY. PLEASE KEEP SCHEDULED APPOINTMENT Jens Som Madolyn Frieze, MD Taking Active   metoprolol succinate (TOPROL-XL) 50 MG 24 hr tablet [id number] Yes TAKE 1 TABLET BY MOUTH EVERY DAY WITH OR IMMEDIATELY FOLLOWING A MEAL Joaquim Nam, MD Taking Active   MOUNJARO 5 MG/0.5ML Pen [id number] Yes INJECT 5MG  SUBCUTANEOUSLY  ONCE WEEKLY Reather Littler, MD Taking Active   nitroGLYCERIN (NITROSTAT) 0.4 MG SL tablet [id number]  Place 1 tablet (0.4 mg total) under the tongue every 5 (five) minutes as needed for chest pain. Lewayne Bunting, MD  Expired 12/20/21 2359   omega-3 acid ethyl esters (LOVAZA) 1 g capsule [id number] Yes TAKE 2 CAPSULES BY MOUTH TWICE A DAY Reather Littler, MD Taking Active   OVER THE COUNTER MEDICATION [id number] Yes Apply 1 application topically daily as needed (pain). Theraworx Pain Cream [provider] Taking Active Spouse/Significant Other  Pimavanserin Tartrate (NUPLAZID) 34  MG CAPS [id number] Yes Samples of this drug were given to the patient, quantity 5, Lot Number [id number] Exp 05/2022 Tat, Octaviano Batty, DO Taking Active   Pimavanserin Tartrate (NUPLAZID) 34 MG CAPS [id number] Yes Take 1 capsule (34 mg total) by  mouth daily. Vladimir Faster, DO Taking Active   polyethylene glycol (MIRALAX / GLYCOLAX) packet [id number] Yes Take 8.5 g by mouth daily. [provider] Taking Active Spouse/Significant Other  rosuvastatin (CRESTOR) 20 MG tablet [id number] Yes TAKE 1 TABLET BY MOUTH EVERYDAY AT BEDTIME Reather Littler, MD Taking Active   traMADol (ULTRAM) 50 MG tablet [id number] Yes TAKE 1 TABLET BY MOUTH TWICE A DAY Joaquim Nam, MD Taking Active   vitamin B-12 (CYANOCOBALAMIN) 1000 MCG tablet [id number] Yes Take 1,000 mcg by mouth daily. [provider] Taking Active Spouse/Significant Other            SDOH:  (Social Determinants of Health) assessments and interventions performed: {yes/no:20286} SDOH Interventions    Flowsheet Row Clinical Support from 12/28/2021 in [facility] [place] Telephone from 10/05/2021 in [facility] [facility] Care Coordination Clinical Support from 08/16/2018 in [facility] [place] Nutrition from 04/13/2017 in [facility] at [facility]  SDOH Interventions      Food Insecurity Interventions Intervention Not Indicated Intervention Not Indicated -- --  Housing Interventions Intervention Not Indicated Intervention Not Indicated -- --  Transportation Interventions Intervention Not Indicated Intervention Not Indicated -- --  Utilities Interventions Intervention Not Indicated -- -- --  Alcohol Usage Interventions Intervention Not Indicated (Score <7) -- -- --  Depression Interventions/Treatment  -- -- PHQ2-9 Score <4 Follow-up Not Indicated --  [refuses medication]  Physical Activity Interventions Intervention Not Indicated -- -- --  Stress Interventions Intervention Not Indicated -- -- --  Social Connections Interventions Intervention Not Indicated -- -- --       Medication Assistance: {MEDASSISTANCEINFO:25044}  Medication Access: Within the past 30 days, how  often has patient missed a dose of medication? *** Is a pillbox or other method used to improve adherence? {YES/NO:21197} Factors that may affect medication adherence? {CHL DESC; BARRIERS:21522} Are meds synced by current pharmacy? {YES/NO:21197} Are meds delivered by current pharmacy? {YES/NO:21197} Does patient experience delays in picking up medications due to transportation concerns? {YES/NO:21197}  [facility] Reviewed: Is patient disadvantaged to use [facility]?: {YES/NO:21197} Current Rx insurance plan: *** Name and location of Current pharmacy:  [id number] [place], [place] - [address] [place] [address] Phone: [phone] Fax: [phone]  [facility] - [place], [place] - [address] AT Portal to Registered [facility] Sites [address] [place] [place] [address] Phone: [phone] Fax: [phone]  [facility] - [place], [place] - [address] [place] [address] Phone: [phone] Fax: [phone]  [facility] - [place], [place] - [address] [place] [address] Phone: [phone] Fax: [phone]  [facility] services reviewed with patient today?: {YES/NO:21197} Patient requests to transfer care to [facility]?: {YES/NO:21197} Reason patient declined to change pharmacies: {US patient preference:27474}  Compliance/Adherence/Medication fill history: Care Gaps: Foot exam (due 09/2021) UACR (due 01/2022)  Star-Rating Drugs: Losartan - PDC 100% Rosuvastatin - PDC 100%    Assessment/Plan  Hypertension / Heart Failure (BP goal <130/80) -Controlled -  -Current home readings: 119/76, 133/84,  106/68, 127/80, 120/77 -Home weight: *** -Last ejection fraction: 30-35% (Date: 01/2022) - previously 25-30% (05/2023) -HF type: HFrEF (EF < 40%); NYHA  Class: not on file; follows w/ Dr Jens Som -Hx OSA on CPAP -Current treatment: Furosemide 80 mg daily - Appropriate, Effective, Safe, Accessible Losartan 25 mg daily HS - Appropriate, Effective, Safe, Accessible Metoprolol succinate 50 mg daily -Appropriate, Effective, Safe, Accessible Klor Con 20 mEq - 3 tab daily -Appropriate, Effective, Safe, Accessible -Medications previously tried: lisinopril, metolazone, spironolactone, Farxiga (UTI); avoid Entresto d/t orthostasis -Denies hypotensive/hypertensive symptoms -Educated on BP goals and benefits of medications for prevention of heart attack, stroke and kidney damage; -Educated on Proper diuretic administration and potassium supplementation -Recommend to continue current medication  Hyperlipidemia / CAD: (LDL goal < 70) -Controlled - LDL 47 (06/2020) at goal; pt has never had to use nitroglycerin; he endorses compliance with statin -Hx CABGx3 2000; 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 -Current treatment: Rosuvastatin 20 mg daily HS- Appropriate, Effective, Safe, Accessible Vascepa 1 g - 2 cap BID -Appropriate, Effective, Safe, Accessible Lovaza 1 g - 2 cap BID Nitroglycerin 0.4 mg SL prn -Appropriate, Effective, Safe, Accessible Aspirin 81 mg daily -Appropriate, Effective, Safe, Accessible -Medications previously tried: atorvastatin  -Educated on Cholesterol goals; Benefits of statin for ASCVD risk reduction; -Recommended to continue current medication  Diabetes (A1c goal <7%) -Controlled - A1c 6.6% (03/2022) at goal; pt denies low sugars; he finally was able to get Dexcom G6 covered and his been using this -Pt reports he typically boluses 45 units w/ breakfast, 55 units w/ lunch and 65 units w/ supper -Pt follows with Dr Lucianne Muss for DM/insulin pump  -Current medications: Humalog via Omnipod pump -Appropriate, Effective, Safe, Accessible Mounjaro 7.5 mg weekly Dexcom G6 - Appropriate, Effective, Safe, Accessible -Medications previously tried: [place] (UTI),  glimepiride, Janumet, metformin -Denies hypoglycemic/hyperglycemic symptoms -Current meal patterns: reduced to one helping of meals -Current exercise: limited d/t Parkinsons -Educated on A1c and blood sugar goals; C -Assessed pt finances - pt reports Ozempic is $40 per 90 days through mail order and insulin is covered -Recommended to continue current medication  Atrial Fibrillation (Goal: prevent stroke and major bleeding) -Controlled -CHADSVASC: 5; no anticoagulation per cardiology due to frequent falls w/ Parkinsons -Current treatment: Metoprolol succinate 50 mg daily -Appropriate, Effective, Safe, Accessible -Recommended to continue current medication  Asthma (Goal: control symptoms and prevent exacerbations) -Controlled - pt reports breathing is well controlled; he endorses using Advair twice daily and albuterol infrequently -Current treatment  Advair ([place]) Diskus 250-50 mcg/act 1 puff BID -Appropriate, Effective, Safe, Accessible Albuterol HFA prn - Appropriate, Effective, Safe, Accessible -Pulmonary function testing: not on file -Exacerbations requiring treatment in last 6 months: 0 -Patient reports consistent use of maintenance inhaler -Frequency of rescue inhaler use: weekly -Counseled on Proper inhaler technique; Differences between maintenance and rescue inhalers -Recommended to continue current medication  Parkinson's Disease (Goal: manage symptoms) -Controlled -s/p PT twice a week; s/p DBS; managed per neurology (Dr Tat) -Current treatment  Carbidopa-levodopa CR 50-200 mg daily HS - Appropriate, Effective, Safe, Accessible Carbidopa-levodopa CR 25-100 mg - 2 tab 5x daily -Appropriate, Effective, Safe, Accessible -Discussed benefits of Sinemet; advised pt to continue home PT exercises on his own now that sessions have ended -Recommended to continue current medication  Hypothyroidism (Goal: maintain TSH in goal range) -Controlled -s/p hyperthyroidism radioiodine  treatment; managed per endocrine (Dr Lucianne Muss) -Current treatment  Levothyroxine 137 mg daily, 1.5 tab on Saturdays -Appropriate, Effective, Safe, Accessible -Recommended to continue current medication   Mardella Layman  Lenice Llamas, PharmD, [employer], CPP [facility] [phone]

## 2022-05-09 ENCOUNTER — Ambulatory Visit
Admission: RE | Admit: 2022-05-09 | Discharge: 2022-05-09 | Disposition: A | Discharge status: Home / Self Care | Payer: Medicare Other | Source: Ambulatory Visit | Attending: Family Medicine | Admitting: Family Medicine

## 2022-05-09 ENCOUNTER — Other Ambulatory Visit: Payer: Self-pay | Admitting: Endocrinology

## 2022-05-09 DIAGNOSIS — R918 Other nonspecific abnormal finding of lung field: Secondary | ICD-10-CM | POA: Diagnosis not present

## 2022-05-09 DIAGNOSIS — R9389 Abnormal findings on diagnostic imaging of other specified body structures: Secondary | ICD-10-CM

## 2022-05-11 ENCOUNTER — Other Ambulatory Visit: Payer: Self-pay | Admitting: Neurology

## 2022-05-26 NOTE — Progress Notes (Signed)
Remote ICD transmission.   

## 2022-05-29 ENCOUNTER — Other Ambulatory Visit: Payer: Self-pay | Admitting: Neurology

## 2022-05-29 DIAGNOSIS — G253 Myoclonus: Secondary | ICD-10-CM

## 2022-05-29 DIAGNOSIS — G20A2 Parkinson's disease without dyskinesia, with fluctuations: Secondary | ICD-10-CM

## 2022-05-29 DIAGNOSIS — G20A1 Parkinson's disease without dyskinesia, without mention of fluctuations: Secondary | ICD-10-CM

## 2022-06-02 NOTE — Telephone Encounter (Signed)
Henry [name] was able to get the foundation to cover his Nuplazid a 100% and they pay 0$ for the medication

## 2022-06-02 NOTE — Progress Notes (Signed)
Assessment/Plan:   1.  Parkinsons Disease             -Status post bilateral DBS STN surgery             -Battery changes occurring September 05, 2012; June 10, 2015; November 13, 2017; 09/25/19.  Has about 9 months left on current battery             -pt does much better when using the U step than the regular walker and should use that at all times.May be close to being WC bound but in the meantime, use the Ustep             -Take carbidopa/levodopa 25/100 CR, currently taking 2 tablets up to 5 times per day (generally takes 9 or 10 tablets/day)             -Take carbidopa/levodopa 50/200 CR at bedtime   -Patient has had some intermittent breakthrough tremor on the right (none today).  However, when I tried to increase his stimulation on that side (which is quite low) he had right-sided face pull.  In the past, I have tried to set up the DBS differently and he just did not like it.  We discussed that we could set it up differently in the future when due for battery change.  2.  RBD             -He has tried clonazepam several times and ultimately ends up stopping it because of side effect.  Could try Rozerem in the future if insurance would pay.  Decided to hold off for now as the patient states that while dreaming is vivid, it is not particularly bothersome and he is not falling out of the bed.  Discussed bedroom safety.  He is actually doing much better now but on Nuplazid. 3.  OSAS, noncompliant with CPAP.             -Patient aware of risks/morbidity and mortality associated with untreated sleep apnea 4.  CHF             -ICD placed on 11/15/2013  -DBS will need placed into special mode if pt ever requires cardioversion 5.  Facial paresthesias             -Has been going on for few years and became bilateral, not c/w TN.  He has had several negative noncontrast CTs of the brain.  Kidney function is not good enough for contrasted CT.  His DBS device is not MRI compatible.             -I am  unsure what this is, and offered to try carbamazepine, but he really did not want that.             -I have turned off his DBS and he noticed no change in the facial paresthesias. 6.  Dysphagia  -Patient had updated modified barium swallow on March 30, 2021.  Patient with mild oral phase dysphagia and mild to moderate pharyngeal phase dysphagia and also the appearance of esophageal phase dysphagia.  The speech therapist recommended for his regular diet, but if he has more coughing or other dysphagia symptoms, to consider nectar thick liquids.  -It should be noted that Parkinson's disease does not cause esophageal phase dysphagia and this may need to be evaluated by gastroenterology.  It could certainly be responsible for the other phases of dysphagia.  7.  Myoclonus  -off of gabapentin now  -on  cymbalta 30 mg for PN 8.  Hallucinations  -Markedly improved on Nuplazid, 34 mg daily. 9.  Paroxysmal A-fib  -Not on anticoagulation because of his history of falls. 10.  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 Wynn Banker to see if he thinks Botox would help.  They will let me know if they want a referral.  Subjective:   Henry [name] was seen today in follow up for Parkinsons disease.  My previous records were reviewed prior to todays visit as well as outside records available to me.  Patient with wife who supplements history.  I started him on Nuplazid last visit. He reports that its working and he is no longer "fighting at night."   He did fall in his carport but didn't have U step (had regular walker).  His U step doesn't go through the grass and he wanted to go in the garden and he ended up falling and scraping knee in the carport.  He had a few other falls that were similar, and all were with the other walker.   We were eventually able to get him on a patient assistance program.  Cardiology notes are reviewed from his December visit with Dr. Jens Som.  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  They  discontinued anticoagulation in the past because of his history of falls.  Current prescribed movement disorder medications: Carbidopa/levodopa 25/100 CR, 2 tablets at 7 AM/11 AM/3 PM/1 tablet at 7 PM (he reports he is not taking the 7am but he takes 2 in the middle of the night/early AM instead) Carbidopa/levodopa 50/200 CR at bedtime  Cymbalta, 30 mg daily Nuplazid, 34 mg daily (added last visit)  Prior medications: Gabapentin (myoclonus);    ALLERGIES:   Allergies  Allergen Reactions   Penicillins Anaphylaxis and Rash    Because of a history of documented adverse serious drug reaction;Medi Alert bracelet  is recommended PATIENT HAS HAD A PCN REACTION WITH IMMEDIATE RASH, FACIAL/TONGUE/THROAT SWELLING, SOB, OR LIGHTHEADEDNESS WITH HYPOTENSION:  #  #  YES  #  #  Has patient had a PCN reaction causing severe rash involving mucus membranes or skin necrosis: unknown Has patient had a PCN reaction that required hospitalization NO Has patient had a PCN reaction occurring within the last 10 years: NO   Klonopin [Clonazepam] Other (See Comments)    agitation   Peanut-Containing Drug Products Cough   Watermelon [Citrullus Vulgaris] Other (See Comments) and Cough    Tickle in throat    CURRENT MEDICATIONS:  Outpatient Encounter Medications as of 06/06/2022  Medication Sig   acetaminophen (TYLENOL) 500 MG tablet Take 1,000 mg by mouth at bedtime as needed for mild pain or headache.    albuterol (VENTOLIN HFA) 108 (90 Base) MCG/ACT inhaler Inhale 2 puffs into the lungs every 6 (six) hours as needed for wheezing or shortness of breath.   allopurinol (ZYLOPRIM) 100 MG tablet TAKE 1 & 1/2 TABLETS BY MOUTH DAILY   aspirin EC 81 MG tablet Take 1 tablet (81 mg total) by mouth daily. Swallow whole.   carbidopa-levodopa (SINEMET CR) 50-200 MG tablet TAKE 1 TABLET BY MOUTH EVERYDAY AT BEDTIME   Carbidopa-Levodopa ER  (SINEMET CR) 25-100 MG tablet controlled release TAKE 2 TABLETS BY MOUTH 5 TIMES PER DAY AS DIRECTED   Cholecalciferol (VITAMIN D) 50 MCG (2000 UT) tablet Take 2,000 Units by mouth daily.    clotrimazole (LOTRIMIN) 1 % cream Apply 1 Application topically  2 (two) times daily.   colchicine 0.6 MG tablet Take 1 tablet (0.6 mg total) by mouth daily as needed (gout).   Continuous Blood Gluc Receiver (DEXCOM G6 RECEIVER) DEVI Use to check blood sugar daily   Continuous Blood Gluc Sensor (DEXCOM G6 SENSOR) MISC Use to check blood sugar daily   Continuous Blood Gluc Transmit (DEXCOM G6 TRANSMITTER) MISC Change every 3 months   dorzolamide-timolol (COSOPT) 22.3-6.8 MG/ML ophthalmic solution Place 1 drop into the right eye 2 (two) times daily.   DULoxetine (CYMBALTA) 30 MG capsule TAKE 1 CAPSULE BY MOUTH EVERY DAY   fluticasone-salmeterol (ADVAIR) 250-50 MCG/ACT AEPB Inhale 1 puff into the lungs in the morning and at bedtime. Rinse after use.   FREESTYLE LITE test strip USE TO CHECK BLOOD SUGAR 4 TIMES DAILY.   furosemide (LASIX) 80 MG tablet TAKE 1 TABLET BY MOUTH EVERY DAY   Insulin Disposable Pump (OMNIPOD 5 G6 INTRO, GEN 5,) KIT 1 kit by Does not apply route daily.   Insulin Disposable Pump (OMNIPOD 5 G6 PODS, GEN 5,) MISC CHANGE POD EVERY 3 DAYS   insulin lispro (HUMALOG) 100 UNIT/ML injection USE MAXIMUM 76 UNITS PER   DAY WITH V-GO PUMP   KLOR-CON M20 20 MEQ tablet TAKE 3 TABLETS (60 MEQ) BY MOUTH DAILY.   levothyroxine (SYNTHROID) 137 MCG tablet TAKE 1 TABLET BY MOUTH EVERY DAY BEFORE BREAKFAST   losartan (COZAAR) 25 MG tablet TAKE 1 TABLET (25 MG TOTAL) BY MOUTH DAILY. PLEASE KEEP SCHEDULED APPOINTMENT   metoprolol succinate (TOPROL-XL) 50 MG 24 hr tablet TAKE 1 TABLET BY MOUTH EVERY DAY WITH OR IMMEDIATELY FOLLOWING A MEAL   MOUNJARO 5 MG/0.5ML Pen INJECT 5MG  SUBCUTANEOUSLY  ONCE WEEKLY   OVER THE COUNTER MEDICATION Apply 1 application topically daily as needed (pain). Theraworx Pain Cream    Pimavanserin Tartrate (NUPLAZID) 34 MG CAPS Samples of this drug were given to the patient, quantity 5, Lot Number [id number] Exp 05/2022   Pimavanserin Tartrate (NUPLAZID) 34 MG CAPS Take 1 capsule (34 mg total) by mouth daily.   polyethylene glycol (MIRALAX / GLYCOLAX) packet Take 8.5 g by mouth daily.   rosuvastatin (CRESTOR) 20 MG tablet TAKE 1 TABLET BY MOUTH EVERYDAY AT BEDTIME   tirzepatide (MOUNJARO) 7.5 MG/0.5ML Pen Inject 7.5 mg into the skin once a week.   traMADol (ULTRAM) 50 MG tablet TAKE 1 TABLET BY MOUTH TWICE A DAY   triamcinolone cream (KENALOG) 0.1 % Apply 1 Application topically daily. Apply to rash   VASCEPA 1 g capsule TAKE 2 CAPSULES BY MOUTH 2 TIMES DAILY.   vitamin B-12 (CYANOCOBALAMIN) 1000 MCG tablet Take 1,000 mcg by mouth daily.   nitroGLYCERIN (NITROSTAT) 0.4 MG SL tablet Place 1 tablet (0.4 mg total) under the tongue every 5 (five) minutes as needed for chest pain.   No facility-administered encounter medications on file as of 06/06/2022.    Objective:   PHYSICAL EXAMINATION:    VITALS:   Vitals:   06/06/22 1317  BP: 122/80  Pulse: 71  SpO2: 99%  Weight: 260 lb 12.8 oz (118.3 kg)  Height: 5\' 9"  (1.753 m)    GEN:  The patient appears stated age and is in NAD. HEENT:  Normocephalic, atraumatic.  The mucous membranes are moist. The superficial temporal arteries are without ropiness or tenderness. Cardiovascular: Regular rate rhythm Lungs: Clear to auscultation bilaterally  Neurological examination:  Orientation: The patient is alert and oriented to person, place, and time.  He asks appropriate questions and is very  interactive today. Cranial nerves: There is good facial symmetry with facial hypomimia. The speech is fluent and dysarthric (baseline) he is hypophonic.Marland Kitchen Soft palate rises symmetrically and there is no tongue deviation. Hearing is intact to conversational tone. Sensation: Sensation is intact to light touch throughout Motor: Strength is at least  antigravity x4.  Movement examination: Tone: There is normal tone in the upper and lower extremities Abnormal movements: No rest tremor today. Coordination:  There is no decremation today. Gait and Station: Patient had his U step in the office today.  He was flexed at the waist, but actually ambulated quite well today and did fairly well in the turns.  The right foot and ankle does evert with ambulation.  I have reviewed and interpreted the following labs independently    Chemistry      Component Value Date/Time   NA 143 12/20/2021 1015   K 4.4 12/20/2021 1015   CL 99 12/20/2021 1015   CO2 31 (H) 12/20/2021 1015   BUN 18 12/20/2021 1015   CREATININE 1.20 12/20/2021 1015   CREATININE 2.13 06/25/2017 0000   CREATININE 1.53 (H) 05/22/2016 1106      Component Value Date/Time   CALCIUM 9.2 12/20/2021 1015   ALKPHOS 72 05/31/2021 1055   AST 7 05/31/2021 1055   ALT 5 12/27/2021 1204   BILITOT 0.5 05/31/2021 1055       Lab Results  Component Value Date   WBC 12.2 (H) 05/24/2020   HGB 14.8 05/24/2020   HCT 44.0 05/24/2020   MCV 84.2 05/24/2020   PLT 121.0 (L) 05/24/2020    Lab Results  Component Value Date   TSH 2.70 12/27/2021     Total time spent on today's visit was 30  minutes, including both face-to-face time and nonface-to-face time.  Time included that spent on review of records (prior notes available to me/labs/imaging if pertinent), discussing treatment and goals, answering patient's questions and coordinating care.  This was independent of his DBS time, on a separate note  Cc:  Joaquim Nam, MD

## 2022-06-06 ENCOUNTER — Ambulatory Visit (INDEPENDENT_AMBULATORY_CARE_PROVIDER_SITE_OTHER): Payer: Medicare Other | Admitting: Neurology

## 2022-06-06 VITALS — BP 122/80 | HR 71 | Ht 69.0 in | Wt 260.8 lb

## 2022-06-06 DIAGNOSIS — G4752 REM sleep behavior disorder: Secondary | ICD-10-CM | POA: Diagnosis not present

## 2022-06-06 DIAGNOSIS — G20A2 Parkinson's disease without dyskinesia, with fluctuations: Secondary | ICD-10-CM

## 2022-06-06 DIAGNOSIS — R441 Visual hallucinations: Secondary | ICD-10-CM | POA: Diagnosis not present

## 2022-06-06 NOTE — Procedures (Signed)
DBS Programming was performed.    Manufacturer of DBS device: [employer]  Total time spent programming was 10 minutes.  Device was confirmed to be on.  Soft start was confirmed to be on.  Impedences were checked and were within normal limits.  Battery was checked and was 25%.    Final settings were as follows with Group B active:   Active Contact Amplitude  PW (ms) Frequency (hz) Side Effects Battery  Left Brain        09/08/19 1-2+ 4.4V 90 170  2.59  10/08/19             Group A 1-2+ 4.45mA 90 170         Group B 0-1-2-C+ 1.1 80 125     0- (1.1)1-(1.0)2-(0.6)       01/19/20             Group B 0-1-2-C+ 1.1 80 125  4 yrs 9 months  05/27/20 Group B 0-1-2-C+ 1.3 80 125  4 years, 1 month  11/30/20 0-1-2-C+ 1.3 80 125  3 years, 5 months  05/31/21 0-1-2-C+ 1.3 80 125 Higher amp with face pull 1 yr, 10 months   12/05/21 0-1-2-C+ 1.4 80 125  2 yr, 3 months  06/06/22 0-1-2-C+ 1.4 80 125            Right Brain        09/08/19 4-7+ 4.5V 90 170    10/08/19             Group A 4-7+ 2.60mA 90 170         Group B 4-7-C+ 2.5 60 125     4-(2.5)7-(0.6)C+       01/19/20             Group B 4-7-C+ 2.8 60 165    05/27/20 group B 4-7-C+ 2.8 60 165    11/30/20 4-7-C+ 2.8 60 165    05/31/21 4-7-C+ 2.9 60 165    12/05/21 4-7-C+ 2.9 60 165    06/06/22 4-7-C+ 2.9 60 165

## 2022-06-08 ENCOUNTER — Other Ambulatory Visit: Payer: Self-pay | Admitting: Family Medicine

## 2022-06-08 DIAGNOSIS — M109 Gout, unspecified: Secondary | ICD-10-CM

## 2022-06-22 ENCOUNTER — Other Ambulatory Visit: Payer: Self-pay | Admitting: Family Medicine

## 2022-06-22 ENCOUNTER — Other Ambulatory Visit: Payer: Self-pay | Admitting: Neurology

## 2022-06-22 ENCOUNTER — Other Ambulatory Visit: Payer: Self-pay | Admitting: Endocrinology

## 2022-06-22 DIAGNOSIS — E890 Postprocedural hypothyroidism: Secondary | ICD-10-CM

## 2022-06-27 ENCOUNTER — Other Ambulatory Visit: Payer: Self-pay | Admitting: Endocrinology

## 2022-06-27 DIAGNOSIS — E1165 Type 2 diabetes mellitus with hyperglycemia: Secondary | ICD-10-CM

## 2022-07-05 ENCOUNTER — Other Ambulatory Visit: Payer: Self-pay | Admitting: Neurology

## 2022-07-05 DIAGNOSIS — G20A1 Parkinson's disease without dyskinesia, without mention of fluctuations: Secondary | ICD-10-CM

## 2022-07-07 ENCOUNTER — Ambulatory Visit: Payer: Medicare Other | Admitting: Cardiology

## 2022-07-08 ENCOUNTER — Other Ambulatory Visit: Payer: Self-pay | Admitting: Endocrinology

## 2022-07-10 ENCOUNTER — Other Ambulatory Visit: Payer: Self-pay | Admitting: Family Medicine

## 2022-07-10 ENCOUNTER — Other Ambulatory Visit: Payer: Self-pay

## 2022-07-10 ENCOUNTER — Encounter: Payer: Self-pay | Admitting: Neurology

## 2022-07-10 DIAGNOSIS — M21371 Foot drop, right foot: Secondary | ICD-10-CM

## 2022-07-10 NOTE — Progress Notes (Signed)
HPI: FU coronary artery disease status post bypass and graft, Parkinson's, diabetes and hypertension as well as hyperlipidemia. Carotid Dopplers in January 2005 showed 0-39% stenosis. Last nuclear study 6/15 showed EF 41, prior MI; no ischemia. Cardiac catheterization September 2015 showed pulmonary Wedge pressure of 16 and PA pressure 32/20. The LAD was occluded. No obstructive disease in the circumflex. The right coronary had severe diffuse distal disease. Saphenous vein graft to the PDA was patent. Saphenous vein graft to diagonal patent. LIMA to the mid LAD patent. Medical therapy recommended. Had ICD placed November 2015. Patient noted to have atrial fibrillation on device interrogation previously. He is asymptomatic with his episodes.  Echocardiogram January 2024 showed ejection fraction 30 to 35%, grade 2 diastolic dysfunction, mild RV dysfunction, mild biatrial enlargement.  Since last seen,   Current Outpatient Medications  Medication Sig Dispense Refill   acetaminophen (TYLENOL) 500 MG tablet Take 1,000 mg by mouth at bedtime as needed for mild pain or headache.      albuterol (VENTOLIN HFA) 108 (90 Base) MCG/ACT inhaler Inhale 2 puffs into the lungs every 6 (six) hours as needed for wheezing or shortness of breath. 18 g 5   allopurinol (ZYLOPRIM) 100 MG tablet TAKE 1 AND 1/2 TABLETS BY MOUTH DAILY 135 tablet 3   aspirin EC 81 MG tablet Take 1 tablet (81 mg total) by mouth daily. Swallow whole. 90 tablet 3   carbidopa-levodopa (SINEMET CR) 50-200 MG tablet TAKE 1 TABLET BY MOUTH EVERYDAY AT BEDTIME 90 tablet 0   Carbidopa-Levodopa ER (SINEMET CR) 25-100 MG tablet controlled release TAKE 2 TABLETS BY MOUTH 5 TIMES PER DAY AS DIRECTED 900 tablet 0   Cholecalciferol (VITAMIN D) 50 MCG (2000 UT) tablet Take 2,000 Units by mouth daily.      clotrimazole (LOTRIMIN) 1 % cream Apply 1 Application topically 2 (two) times daily. 30 g 0   Continuous Blood Gluc Receiver (DEXCOM G6 RECEIVER) DEVI  Use to check blood sugar daily 1 each 0   Continuous Blood Gluc Sensor (DEXCOM G6 SENSOR) MISC Use to check blood sugar daily 9 each 3   Continuous Blood Gluc Transmit (DEXCOM G6 TRANSMITTER) MISC Change every 3 months 1 each 3   dorzolamide-timolol (COSOPT) 22.3-6.8 MG/ML ophthalmic solution Place 1 drop into the right eye 2 (two) times daily.     DULoxetine (CYMBALTA) 30 MG capsule TAKE 1 CAPSULE BY MOUTH EVERY DAY 90 capsule 0   fluticasone-salmeterol (ADVAIR) 250-50 MCG/ACT AEPB Inhale 1 puff into the lungs in the morning and at bedtime. Rinse after use. 180 each 3   furosemide (LASIX) 80 MG tablet TAKE 1 TABLET BY MOUTH EVERY DAY 90 tablet 1   Insulin Disposable Pump (OMNIPOD 5 G6 INTRO, GEN 5,) KIT 1 kit by Does not apply route daily. 1 kit 0   Insulin Disposable Pump (OMNIPOD 5 G6 PODS, GEN 5,) MISC CHANGE POD EVERY 3 DAYS 10 each 3   insulin lispro (HUMALOG) 100 UNIT/ML injection USE MAXIMUM 76 UNITS PER   DAY WITH V-GO PUMP 60 mL 3   KLOR-CON M20 20 MEQ tablet TAKE 3 TABLETS (60 MEQ) BY MOUTH DAILY. 270 tablet 1   levothyroxine (SYNTHROID) 137 MCG tablet TAKE 1 TABLET BY MOUTH EVERY DAY BEFORE BREAKFAST 90 tablet 1   losartan (COZAAR) 25 MG tablet TAKE 1 TABLET (25 MG TOTAL) BY MOUTH DAILY. PLEASE KEEP SCHEDULED APPOINTMENT 90 tablet 2   metoprolol succinate (TOPROL-XL) 50 MG 24 hr tablet TAKE 1 TABLET BY  MOUTH EVERY DAY WITH OR IMMEDIATELY FOLLOWING A MEAL 90 tablet 1   OVER THE COUNTER MEDICATION Apply 1 application topically daily as needed (pain). Theraworx Pain Cream     Pimavanserin Tartrate (NUPLAZID) 34 MG CAPS Samples of this drug were given to the patient, quantity 5, Lot Number [id number] Exp 05/2022 30 capsule 0   Pimavanserin Tartrate (NUPLAZID) 34 MG CAPS Take 1 capsule (34 mg total) by mouth daily. 90 capsule 1   polyethylene glycol (MIRALAX / GLYCOLAX) packet Take 8.5 g by mouth daily.     rosuvastatin (CRESTOR) 20 MG tablet TAKE 1 TABLET BY MOUTH EVERYDAY AT BEDTIME 90 tablet  1   tirzepatide (MOUNJARO) 7.5 MG/0.5ML Pen Inject 7.5 mg into the skin once a week. 6 mL 2   traMADol (ULTRAM) 50 MG tablet TAKE 1 TABLET BY MOUTH TWICE A DAY 60 tablet 2   triamcinolone cream (KENALOG) 0.1 % Apply 1 Application topically daily. Apply to rash 15 g 0   VASCEPA 1 g capsule TAKE 2 CAPSULES BY MOUTH TWICE A DAY 120 capsule 1   vitamin B-12 (CYANOCOBALAMIN) 1000 MCG tablet Take 1,000 mcg by mouth daily.     colchicine 0.6 MG tablet Take 1 tablet (0.6 mg total) by mouth daily as needed (gout). (Patient not taking: Reported on 07/18/2022)     FREESTYLE LITE test strip USE TO CHECK BLOOD SUGAR 4 TIMES DAILY. (Patient not taking: Reported on 07/18/2022) 150 strip 3   MOUNJARO 5 MG/0.5ML Pen INJECT 5MG  SUBCUTANEOUSLY  ONCE WEEKLY (Patient not taking: Reported on 07/18/2022) 6 mL 1   nitroGLYCERIN (NITROSTAT) 0.4 MG SL tablet Place 1 tablet (0.4 mg total) under the tongue every 5 (five) minutes as needed for chest pain. 25 tablet 12   No current facility-administered medications for this visit.     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   Dysrhythmia    a-fib   GERD (gastroesophageal reflux disease)    Glaucoma    right eye   History of kidney stones    multiple   HLD (hyperlipidemia)    HTN (hypertension)    pt denies 08/19/12   Hyperplasia, prostate    Hypothyroidism    MI (myocardial infarction) (HCC)    Dr Jens Som 2000, x3vessels bypass   OSA (obstructive sleep apnea)    AHI-28,on CPAP, noncompliant with CPAP   Parkinson disease    1999   PONV (postoperative nausea and  vomiting)    Restless legs    Shortness of breath    Hx: of at all times   UTI (lower urinary tract infection) 09/15/12   Klebsiella   Ventral hernia    Walker as ambulation aid    also uses wheelchair at home/when going out    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   BIOPSY  10/14/2018   Procedure: BIOPSY;  Surgeon: Beverley Fiedler,  MD;  Location: [facility];  Service: Gastroenterology;;   CARDIAC CATHETERIZATION     CATARACT EXTRACTION W/ INTRAOCULAR LENS IMPLANT     Hx: of right eye   CATARACT EXTRACTION W/ INTRAOCULAR LENS IMPLANT Left 2018   COLONOSCOPY N/A 10/13/2014   Procedure: COLONOSCOPY;  Surgeon: Beverley Fiedler, MD;  Location: [facility];  Service: Gastroenterology;  Laterality: N/A;   COLONOSCOPY W/ BIOPSIES AND POLYPECTOMY     Hx: of   COLONOSCOPY WITH PROPOFOL N/A 10/14/2018   Procedure: COLONOSCOPY WITH PROPOFOL;  Surgeon: Beverley Fiedler, MD;  Location: [facility];  Service: Gastroenterology;  Laterality: N/A;   CORONARY ARTERY BYPASS GRAFT  2000   Tressie Stalker, MD   CORONARY STENT PLACEMENT  1998   DEEP BRAIN STIMULATOR PLACEMENT  2004   Right and left VIN stimulator placement (parkinsons)   EYE SURGERY     FINGER AMPUTATION     left pointer   IMPLANTABLE CARDIOVERTER DEFIBRILLATOR IMPLANT N/A 11/13/2013   Procedure: IMPLANTABLE CARDIOVERTER DEFIBRILLATOR IMPLANT;  Surgeon: Marinus Maw, MD;  Location: [facility] CATH LAB;  Service: Cardiovascular;  Laterality: N/A;   INSERT / REPLACE / REMOVE PACEMAKER     medtronic   LEFT AND RIGHT HEART CATHETERIZATION WITH CORONARY ANGIOGRAM N/A 09/24/2013   Procedure: LEFT AND RIGHT HEART CATHETERIZATION WITH CORONARY ANGIOGRAM;  Surgeon: Kathleene Hazel, MD;  Location: [facility] CATH LAB;  Service: Cardiovascular;  Laterality: N/A;   LITHOTRIPSY     3 different times   MEDIAN STERNOTOMY  2000   POLYPECTOMY  10/14/2018   Procedure: POLYPECTOMY;  Surgeon: Beverley Fiedler, MD;  Location: [facility];  Service: Gastroenterology;;   PULSE GENERATOR IMPLANT Right 11/13/2017   Procedure: Right chest implantable pulse generator change;  Surgeon: Maeola Harman, MD;  Location: [facility] OR;  Service: Neurosurgery;  Laterality: Right;  Right chest implantable pulse generator change   SUBTHALAMIC STIMULATOR BATTERY REPLACEMENT N/A 09/05/2012   Procedure: Deep brain stimulator battery change;  Surgeon: Maeola Harman, MD;  Location: [facility];  Service: Neurosurgery;  Laterality: N/A;  Deep brain stimulator battery change   SUBTHALAMIC STIMULATOR BATTERY REPLACEMENT N/A 06/10/2015   Procedure: Deep Brain stimulator battery change;  Surgeon: Maeola Harman, MD;  Location: [facility];  Service: Neurosurgery;  Laterality: N/A;   SUBTHALAMIC STIMULATOR BATTERY REPLACEMENT Right 09/25/2019   Procedure: Deep brain stimulator battery change;  Surgeon: Maeola Harman, MD;  Location: [facility] OR;  Service: Neurosurgery;  Laterality: Right;   TONSILLECTOMY      Social History   Socioeconomic History   Marital status: Married    Spouse name: [name]   Number of children: 2   Years of education: 12   Highest education level: High school graduate  Occupational History   Occupation: DISABLED    Comment: [employer], CABINET MAKER  Tobacco Use   Smoking status: Never    Passive exposure: Never   Smokeless tobacco: Never  Vaping Use   Vaping Use: Never used  Substance and Sexual Activity   Alcohol use: Not Currently   Drug use: No   Sexual activity: Not Currently  Other Topics Concern   Not on file  Social History Narrative   From [place]   Retired/disability Archivist   Likes to fish.     Married 1972   3 kids   [place] fan   Social Determinants of Health   Financial Resource Strain: Low Risk  (12/23/2020)   Overall Financial Resource Strain (CARDIA)    Difficulty  of Paying Living Expenses: Not hard at all  Food Insecurity: No Food Insecurity (12/28/2021)    Hunger Vital Sign    Worried About Running Out of Food in the Last Year: Never true    Ran Out of Food in the Last Year: Never true  Transportation Needs: No Transportation Needs (12/28/2021)   PRAPARE - Administrator, Civil Service (Medical): No    Lack of Transportation (Non-Medical): No  Physical Activity: Inactive (12/28/2021)   Exercise Vital Sign    Days of Exercise per Week: 0 days    Minutes of Exercise per Session: 0 min  Stress: No Stress Concern Present (12/28/2021)   [employer] of Occupational Health - Occupational Stress Questionnaire    Feeling of Stress : Not at all  Social Connections: Socially Integrated (12/28/2021)   Social Connection and Isolation Panel [NHANES]    Frequency of Communication with Friends and Family: More than three times a week    Frequency of Social Gatherings with Friends and Family: More than three times a week    Attends Religious Services: More than 4 times per year    Active Member of [employer] or Organizations: Yes    Attends Engineer, structural: More than 4 times per year    Marital Status: Married  Catering manager Violence: Not At Risk (12/28/2021)   Humiliation, Afraid, Rape, and Kick questionnaire    Fear of Current or Ex-Partner: No    Emotionally Abused: No    Physically Abused: No    Sexually Abused: No    Family History  Problem Relation Age of Onset   Aneurysm Mother    Alcoholism Father    HIV/AIDS Brother 44       AIDS   Healthy Sister    Healthy Child    Peripheral vascular disease Other    Arthritis Other    Healthy Sister    Healthy Child    Healthy Child    Diabetes Neg Hx    Heart disease Neg Hx    Colon cancer Neg Hx    Prostate cancer Neg Hx     ROS: no fevers or chills, productive cough, hemoptysis, dysphasia, odynophagia, melena, hematochezia, dysuria, hematuria, rash, seizure activity, orthopnea, PND, pedal edema, claudication. Remaining systems are negative.  Physical  Exam: Well-developed well-nourished in no acute distress.  Skin is warm and dry.  HEENT is normal.  Neck is supple.  Chest is clear to auscultation with normal expansion.  Cardiovascular exam is regular rate and rhythm.  Abdominal exam nontender or distended. No masses palpated. Extremities show no edema. neuro grossly intact  EKG Interpretation Date/Time:  Tuesday July 18 2022 11:00:43 EDT Ventricular Rate:  68 PR Interval:    QRS Duration:  88 QT Interval:  382 QTC Calculation: 406 R Axis:   75  Text Interpretation: NSR first degree AV block Cannot rule out Anterior infarct , age undetermined Non-specific ST-t changes When compared with ECG of 19-Aug-2019 15:52, Confirmed by Olga Millers ([id number]) on 07/18/2022 11:07:13 AM     A/P  1 coronary artery disease-patient denies chest pain.  Continue medical therapy with aspirin and statin.  2 paroxysmal atrial fibrillation-patient's anticoagulation was discontinued previously due to his Parkinson's/recurrent falls.  I continue to feel risk outweighs benefit at this point.  Continue beta-blocker.  3 ischemic cardiomyopathy-will continue beta-blocker at present dose.  He has had significant orthostasis in the past due to autonomic dysfunction/Parkinson's.  I have therefore not been aggressive and  advancing his medications.  Check potassium and renal function.  4 hypertension-plan as outlined above.  5 chronic combined systolic/diastolic congestive heart failure-continue Lasix at present dose.  Add spironolactone 12.5 mg daily.  He did not tolerate SGLT2 inhibitor previously.  Check potassium and renal function in 1 week.  Check BNP.  6 hyperlipidemia-continue statin.  Check lipids and liver.  7 ICD-managed by electrophysiology.  Olga Millers, MD

## 2022-07-11 NOTE — Telephone Encounter (Signed)
Refill request for TRAMADOL HCL 50 MG TABLET   LOV - 03/03/22 Next OV - not scheduled Last refill - 04/12/22 #60/2

## 2022-07-18 ENCOUNTER — Encounter: Payer: Self-pay | Admitting: Cardiology

## 2022-07-18 ENCOUNTER — Other Ambulatory Visit: Payer: Self-pay

## 2022-07-18 ENCOUNTER — Ambulatory Visit: Payer: Medicare Other | Attending: Cardiology | Admitting: Cardiology

## 2022-07-18 VITALS — BP 108/60 | HR 76 | Ht 69.0 in | Wt 260.0 lb

## 2022-07-18 DIAGNOSIS — Z95810 Presence of automatic (implantable) cardiac defibrillator: Secondary | ICD-10-CM | POA: Diagnosis not present

## 2022-07-18 DIAGNOSIS — I480 Paroxysmal atrial fibrillation: Secondary | ICD-10-CM | POA: Insufficient documentation

## 2022-07-18 DIAGNOSIS — I255 Ischemic cardiomyopathy: Secondary | ICD-10-CM | POA: Insufficient documentation

## 2022-07-18 DIAGNOSIS — I5022 Chronic systolic (congestive) heart failure: Secondary | ICD-10-CM | POA: Insufficient documentation

## 2022-07-18 DIAGNOSIS — I2510 Atherosclerotic heart disease of native coronary artery without angina pectoris: Secondary | ICD-10-CM | POA: Diagnosis not present

## 2022-07-18 DIAGNOSIS — I10 Essential (primary) hypertension: Secondary | ICD-10-CM | POA: Insufficient documentation

## 2022-07-18 MED ORDER — NITROGLYCERIN 0.4 MG SL SUBL: 0.4000 mg | SUBLINGUAL_TABLET | SUBLINGUAL | 12 refills | Status: AC | PRN

## 2022-07-18 MED ORDER — SPIRONOLACTONE 25 MG PO TABS
12.5000 mg | ORAL_TABLET | Freq: Every day | ORAL | 3 refills | Status: DC
Start: 2022-07-18 — End: 2023-05-15

## 2022-07-18 NOTE — Patient Instructions (Signed)
Medication Instructions:   SPIRONOLACTONE 12.5 MG ONCE DAILY= 1/2 OF THE 25 MG TABLET ONCE DAILY  *If you need a refill on your cardiac medications before your next appointment, please call your pharmacy*   Lab Work:  Your physician recommends that you return for lab work in: [facility]  If you have labs (blood work) drawn today and your tests are completely normal, you will receive your results only by: MyChart Message (if you have MyChart) OR A paper copy in the mail If you have any lab test that is abnormal or we need to change your treatment, we will call you to review the results.    Follow-Up: At [facility], you and your health needs are our priority.  As part of our continuing mission to provide you with exceptional heart care, we have created designated Provider Care Teams.  These Care Teams include your primary Cardiologist (physician) and Advanced Practice Providers (APPs -  Physician Assistants and Nurse Practitioners) who all work together to provide you with the care you need, when you need it.  We recommend signing up for the patient portal called "MyChart".  Sign up information is provided on this After Visit Summary.  MyChart is used to connect with patients for Virtual Visits (Telemedicine).  Patients are able to view lab/test results, encounter notes, upcoming appointments, etc.  Non-urgent messages can be sent to your provider as well.   To learn more about what you can do with MyChart, go to [email or web address].    Your next appointment:   6 month(s)  Provider:   Olga Millers, MD

## 2022-07-24 ENCOUNTER — Other Ambulatory Visit: Payer: Self-pay

## 2022-07-24 ENCOUNTER — Encounter: Payer: Self-pay | Admitting: Endocrinology

## 2022-07-24 DIAGNOSIS — E1165 Type 2 diabetes mellitus with hyperglycemia: Secondary | ICD-10-CM

## 2022-07-24 MED ORDER — DEXCOM G6 TRANSMITTER MISC
3 refills | Status: DC
Start: 2022-07-24 — End: 2023-01-30

## 2022-07-24 MED ORDER — DEXCOM G6 SENSOR MISC
3 refills | Status: DC
Start: 2022-07-24 — End: 2023-01-30

## 2022-07-25 ENCOUNTER — Other Ambulatory Visit: Payer: Self-pay | Admitting: Neurology

## 2022-07-25 DIAGNOSIS — G20A1 Parkinson's disease without dyskinesia, without mention of fluctuations: Secondary | ICD-10-CM

## 2022-07-25 DIAGNOSIS — R443 Hallucinations, unspecified: Secondary | ICD-10-CM

## 2022-07-28 ENCOUNTER — Other Ambulatory Visit (INDEPENDENT_AMBULATORY_CARE_PROVIDER_SITE_OTHER): Payer: Medicare Other

## 2022-07-28 DIAGNOSIS — E890 Postprocedural hypothyroidism: Secondary | ICD-10-CM

## 2022-07-28 DIAGNOSIS — Z794 Long term (current) use of insulin: Secondary | ICD-10-CM

## 2022-07-28 DIAGNOSIS — I5043 Acute on chronic combined systolic (congestive) and diastolic (congestive) heart failure: Secondary | ICD-10-CM

## 2022-07-28 DIAGNOSIS — E782 Mixed hyperlipidemia: Secondary | ICD-10-CM | POA: Diagnosis not present

## 2022-07-28 DIAGNOSIS — E1165 Type 2 diabetes mellitus with hyperglycemia: Secondary | ICD-10-CM | POA: Diagnosis not present

## 2022-07-28 DIAGNOSIS — I5042 Chronic combined systolic (congestive) and diastolic (congestive) heart failure: Secondary | ICD-10-CM

## 2022-07-28 NOTE — Addendum Note (Signed)
Addended by: Alvina Chou on: 07/28/2022 08:26 AM   Modules accepted: Orders

## 2022-07-28 NOTE — Addendum Note (Signed)
Addended by: Alvina Chou on: 07/28/2022 09:49 AM   Modules accepted: Orders

## 2022-07-29 LAB — COMPREHENSIVE METABOLIC PANEL
AG Ratio: 2 (calc) (ref 1.0–2.5)
ALT: <3 U/L — ABNORMAL LOW (ref 9–46)
AST: 5 U/L — ABNORMAL LOW (ref 10–35)
Albumin: 4.2 g/dL (ref 3.6–5.1)
Alkaline phosphatase (APISO): 76 U/L (ref 35–144)
BUN/Creatinine Ratio: 22 (calc) (ref 6–22)
BUN: 30 mg/dL — ABNORMAL HIGH (ref 7–25)
CO2: 31 mmol/L (ref 20–32)
Calcium: 9.1 mg/dL (ref 8.6–10.3)
Chloride: 99 mmol/L (ref 98–110)
Creat: 1.37 mg/dL — ABNORMAL HIGH (ref 0.70–1.28)
Globulin: 2.1 g/dL (calc) (ref 1.9–3.7)
Glucose, Bld: 135 mg/dL — ABNORMAL HIGH (ref 65–99)
Potassium: 4.3 mmol/L (ref 3.5–5.3)
Sodium: 140 mmol/L (ref 135–146)
Total Bilirubin: 0.5 mg/dL (ref 0.2–1.2)
Total Protein: 6.3 g/dL (ref 6.1–8.1)

## 2022-07-29 LAB — LIPID PANEL
Cholesterol: 105 mg/dL (ref <200)
HDL: 31 mg/dL — ABNORMAL LOW (ref >=40)
LDL Cholesterol (Calc): 53 mg/dL (calc)
Non-HDL Cholesterol (Calc): 74 mg/dL (calc) (ref <130)
Total CHOL/HDL Ratio: 3.4 (calc) (ref <5.0)
Triglycerides: 129 mg/dL (ref <150)

## 2022-07-29 LAB — T4, FREE: Free T4: 1.1 ng/dL (ref 0.8–1.8)

## 2022-07-29 LAB — BRAIN NATRIURETIC PEPTIDE: Brain Natriuretic Peptide: 15 pg/mL (ref <100)

## 2022-07-29 LAB — TSH: TSH: 5.21 mIU/L — ABNORMAL HIGH (ref 0.40–4.50)

## 2022-07-31 ENCOUNTER — Encounter: Payer: Self-pay | Admitting: Endocrinology

## 2022-07-31 ENCOUNTER — Ambulatory Visit: Payer: Medicare Other | Admitting: Endocrinology

## 2022-07-31 VITALS — BP 118/65 | HR 67 | Ht 69.0 in | Wt 258.0 lb

## 2022-07-31 DIAGNOSIS — E782 Mixed hyperlipidemia: Secondary | ICD-10-CM | POA: Diagnosis not present

## 2022-07-31 DIAGNOSIS — Z794 Long term (current) use of insulin: Secondary | ICD-10-CM | POA: Diagnosis not present

## 2022-07-31 DIAGNOSIS — E890 Postprocedural hypothyroidism: Secondary | ICD-10-CM

## 2022-07-31 DIAGNOSIS — E1165 Type 2 diabetes mellitus with hyperglycemia: Secondary | ICD-10-CM | POA: Diagnosis not present

## 2022-07-31 LAB — POCT GLYCOSYLATED HEMOGLOBIN (HGB A1C): Hemoglobin A1C: 6.2 % — AB (ref 4.0–5.6)

## 2022-07-31 MED ORDER — LEVOTHYROXINE SODIUM 175 MCG PO TABS: 175.0000 ug | ORAL_TABLET | Freq: Every day | ORAL | 1 refills | Status: DC

## 2022-07-31 MED ORDER — TIRZEPATIDE 7.5 MG/0.5ML ~~LOC~~ SOAJ: 7.5000 mg | SUBCUTANEOUS | 2 refills | Status: DC

## 2022-07-31 NOTE — Progress Notes (Signed)
Patient ID: Henry [name], male   DOB: 12-14-1950, 72 y.o.   MRN: [id number]           Reason for Appointment: Follow-up for Type 2 Diabetes   History of Present Illness:          Diagnosis: Type 2 diabetes mellitus, date of diagnosis: 2000       Recent history:    INSULIN regimen: Omnipod insulin pump 5 with Humalog insulin   Basal rate program settings: 12 AM = 1.3, 3:30 AM = 1.6, 6 AM = 2.0, 12 PM = 2 point 5 and 10 PM = 2.2  Boluses 3-5 units for breakfast, 4-6 lunch and 6-7 units for dinner, 2-3 units for snacks Sensitivity 1: 60 carb ratio 1:10, target 120  Non-insulin hypoglycemic drugs the patient is taking are: Mounjaro 5 mg weekly  His A1c is 6.2  Current management, blood sugar patterns and problems identified: He has taken Mounjaro 7.5 mg and no difficulties with nausea with the higher dose As before he still has difficulty losing weight He says that he has not had any sensor supplies from the DME for the last 2 months He still thinks that he is trying to eat healthy meals although adding a bedtime snack Also trying to bolus at meals although could not verify whether he is bolusing or not because of his not bringing his PDM for download His wife is helping him with the boluses before eating Generally entering about 45 -55- 65 g of carbohydrate for the 3 meals    He has seen the dietitian in 04/2017  Not able to exercise because of back pain and difficulty walking       Side effects from medications have been: None Compliance with the medical regimen: Fair   Recent blood sugars from [facility] meter: FASTING range 113-152 and first week of July 1, 130-175  Previous data   CGM use % of time 99  2-week average/GV 151/19  Time in range       85 %  % Time Above 180 14  % Time above 250   % Time Below 70     Meals: 9 am , 2-3 pm and 6-7 pm            Dietician visit, most recent: 4/19               Weight history:   Wt Readings from Last 3 Encounters:   07/31/22 258 lb (117 kg)  07/18/22 260 lb (117.9 kg)  06/06/22 260 lb 12.8 oz (118.3 kg)    Glycemic control:   Lab Results  Component Value Date   HGBA1C 6.2 (A) 07/31/2022   HGBA1C 6.6 (A) 03/29/2022   HGBA1C 6.3 (A) 12/27/2021   Lab Results  Component Value Date   MICROALBUR <0.7 02/04/2021   LDLCALC 53 07/28/2022   CREATININE 1.37 (H) 07/28/2022    Lab Results  Component Value Date   FRUCTOSAMINE 359 (H) 07/01/2019   FRUCTOSAMINE 277 11/09/2017   FRUCTOSAMINE 350 (H) 03/29/2017    Past history:   He has been taking Amaryl for several years and probably metformin since onset also At some point he was changed from metformin to Janumet and was taking this since at least 2012 A1c had been higher in 2015 Since his A1c had been progressively higher with his regimen of Janumet and Amaryl he was started on Victoza in 01/2014 He was started on Lantus insulin in 5/16 because of persistent  hyperglycemia especially fasting; was having readings as high as 293 Because of tendency to high postprandial readings and for control with Lantus he was switched to the V-go pump in  09/2014   Other active problems: See review of systems    Allergies as of 07/31/2022       Reactions   Penicillins Anaphylaxis, Rash   Because of a history of documented adverse serious drug reaction;Medi Alert bracelet  is recommended PATIENT HAS HAD A PCN REACTION WITH IMMEDIATE RASH, FACIAL/TONGUE/THROAT SWELLING, SOB, OR LIGHTHEADEDNESS WITH HYPOTENSION:  #  #  YES  #  #  Has patient had a PCN reaction causing severe rash involving mucus membranes or skin necrosis: unknown Has patient had a PCN reaction that required hospitalization NO Has patient had a PCN reaction occurring within the last 10 years: NO   Klonopin [clonazepam] Other (See Comments)   agitation   Peanut-containing Drug Products Cough   Watermelon [citrullus Vulgaris] Other (See Comments), Cough   Tickle in throat        Medication  List        Accurate as of July 31, 2022  3:05 PM. If you have any questions, ask your nurse or doctor.          acetaminophen 500 MG tablet Commonly known as: TYLENOL Take 1,000 mg by mouth at bedtime as needed for mild pain or headache.   albuterol 108 (90 Base) MCG/ACT inhaler Commonly known as: VENTOLIN HFA Inhale 2 puffs into the lungs every 6 (six) hours as needed for wheezing or shortness of breath.   allopurinol 100 MG tablet Commonly known as: ZYLOPRIM TAKE 1 AND 1/2 TABLETS BY MOUTH DAILY   aspirin EC 81 MG tablet Take 1 tablet (81 mg total) by mouth daily. Swallow whole.   Carbidopa-Levodopa ER 25-100 MG tablet controlled release Commonly known as: SINEMET CR TAKE 2 TABLETS BY MOUTH 5 TIMES PER DAY AS DIRECTED   carbidopa-levodopa 50-200 MG tablet Commonly known as: SINEMET CR TAKE 1 TABLET BY MOUTH EVERYDAY AT BEDTIME   clotrimazole 1 % cream Commonly known as: LOTRIMIN Apply 1 Application topically 2 (two) times daily.   colchicine 0.6 MG tablet Take 1 tablet (0.6 mg total) by mouth daily as needed (gout).   cyanocobalamin 1000 MCG tablet Commonly known as: VITAMIN B12 Take 1,000 mcg by mouth daily.   Dexcom G6 Receiver Devi Use to check blood sugar daily   Dexcom G6 Sensor Misc Use to check blood sugar daily   Dexcom G6 Transmitter Misc Change every 3 months   dorzolamide-timolol 2-0.5 % ophthalmic solution Commonly known as: COSOPT Place 1 drop into the right eye 2 (two) times daily.   DULoxetine 30 MG capsule Commonly known as: CYMBALTA TAKE 1 CAPSULE BY MOUTH EVERY DAY   fluticasone-salmeterol 250-50 MCG/ACT Aepb Commonly known as: ADVAIR Inhale 1 puff into the lungs in the morning and at bedtime. Rinse after use.   FREESTYLE LITE test strip Generic drug: glucose blood USE TO CHECK BLOOD SUGAR 4 TIMES DAILY.   furosemide 80 MG tablet Commonly known as: LASIX TAKE 1 TABLET BY MOUTH EVERY DAY   insulin lispro 100 UNIT/ML  injection Commonly known as: HumaLOG USE MAXIMUM 76 UNITS PER   DAY WITH V-GO PUMP   Klor-Con M20 20 MEQ tablet Generic drug: potassium chloride SA TAKE 3 TABLETS (60 MEQ) BY MOUTH DAILY.   levothyroxine 175 MCG tablet Commonly known as: Synthroid Take 1 tablet (175 mcg total) by mouth daily before  breakfast. What changed:  medication strength See the new instructions. Changed by: Reather Littler   losartan 25 MG tablet Commonly known as: COZAAR TAKE 1 TABLET (25 MG TOTAL) BY MOUTH DAILY. PLEASE KEEP SCHEDULED APPOINTMENT   metoprolol succinate 50 MG 24 hr tablet Commonly known as: TOPROL-XL TAKE 1 TABLET BY MOUTH EVERY DAY WITH OR IMMEDIATELY FOLLOWING A MEAL   nitroGLYCERIN 0.4 MG SL tablet Commonly known as: NITROSTAT Place 1 tablet (0.4 mg total) under the tongue every 5 (five) minutes as needed for chest pain.   Nuplazid 34 MG Caps Generic drug: Pimavanserin Tartrate Samples of this drug were given to the patient, quantity 5, Lot Number [id number] Exp 05/2022   Nuplazid 34 MG Caps Generic drug: Pimavanserin Tartrate TAKE 1 CAPSULE BY MOUTH 1 TIME A DAY   Omnipod 5 G6 Intro (Gen 5) Kit 1 kit by Does not apply route daily.   Omnipod 5 G6 Pods (Gen 5) Misc CHANGE POD EVERY 3 DAYS   OVER THE COUNTER MEDICATION Apply 1 application topically daily as needed (pain). Theraworx Pain Cream   polyethylene glycol 17 g packet Commonly known as: MIRALAX / GLYCOLAX Take 8.5 g by mouth daily.   rosuvastatin 20 MG tablet Commonly known as: CRESTOR TAKE 1 TABLET BY MOUTH EVERYDAY AT BEDTIME   spironolactone 25 MG tablet Commonly known as: ALDACTONE Take 0.5 tablets (12.5 mg total) by mouth daily.   tirzepatide 7.5 MG/0.5ML Pen Commonly known as: MOUNJARO Inject 7.5 mg into the skin once a week. What changed: Another medication with the same name was removed. Continue taking this medication, and follow the directions you see here. Changed by: Reather Littler   traMADol 50 MG  tablet Commonly known as: ULTRAM TAKE 1 TABLET BY MOUTH TWICE A DAY   triamcinolone cream 0.1 % Commonly known as: KENALOG Apply 1 Application topically daily. Apply to rash   Vascepa 1 g capsule Generic drug: icosapent Ethyl TAKE 2 CAPSULES BY MOUTH TWICE A DAY   Vitamin D 50 MCG (2000 UT) tablet Take 2,000 Units by mouth daily.        Allergies:  Allergies  Allergen Reactions   Penicillins Anaphylaxis and Rash    Because of a history of documented adverse serious drug reaction;Medi Alert bracelet  is recommended PATIENT HAS HAD A PCN REACTION WITH IMMEDIATE RASH, FACIAL/TONGUE/THROAT SWELLING, SOB, OR LIGHTHEADEDNESS WITH HYPOTENSION:  #  #  YES  #  #  Has patient had a PCN reaction causing severe rash involving mucus membranes or skin necrosis: unknown Has patient had a PCN reaction that required hospitalization NO Has patient had a PCN reaction occurring within the last 10 years: NO   Klonopin [Clonazepam] Other (See Comments)    agitation   Peanut-Containing Drug Products Cough   Watermelon [Citrullus Vulgaris] Other (See Comments) and Cough    Tickle in throat    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   Dysrhythmia  a-fib   GERD (gastroesophageal reflux disease)    Glaucoma    right eye   History of kidney stones    multiple   HLD (hyperlipidemia)    HTN (hypertension)    pt denies 08/19/12   Hyperplasia, prostate    Hypothyroidism    MI (myocardial infarction) (HCC)    Dr Jens Som 2000, x3vessels bypass   OSA (obstructive sleep apnea)    AHI-28,on  CPAP, noncompliant with CPAP   Parkinson disease    1999   PONV (postoperative nausea and vomiting)    Restless legs    Shortness of breath    Hx: of at all times   UTI (lower urinary tract infection) 09/15/12   Klebsiella   Ventral hernia    Walker as ambulation aid    also uses wheelchair at home/when going out    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   BIOPSY  10/14/2018   Procedure: BIOPSY;  Surgeon: Beverley Fiedler, MD;  Location: [facility];  Service: Gastroenterology;;   CARDIAC CATHETERIZATION     CATARACT EXTRACTION W/ INTRAOCULAR LENS IMPLANT     Hx: of right eye   CATARACT EXTRACTION W/ INTRAOCULAR LENS IMPLANT Left 2018   COLONOSCOPY N/A 10/13/2014   Procedure: COLONOSCOPY;  Surgeon: Beverley Fiedler, MD;  Location: [facility];  Service: Gastroenterology;  Laterality: N/A;   COLONOSCOPY W/ BIOPSIES AND POLYPECTOMY     Hx: of   COLONOSCOPY WITH PROPOFOL N/A 10/14/2018   Procedure: COLONOSCOPY WITH PROPOFOL;  Surgeon: Beverley Fiedler, MD;  Location: [facility];  Service: Gastroenterology;  Laterality: N/A;   CORONARY ARTERY BYPASS GRAFT  2000   Tressie Stalker, MD   CORONARY STENT PLACEMENT  1998   DEEP BRAIN STIMULATOR PLACEMENT  2004   Right and left VIN stimulator placement (parkinsons)   EYE SURGERY     FINGER AMPUTATION     left pointer   IMPLANTABLE CARDIOVERTER DEFIBRILLATOR IMPLANT N/A 11/13/2013   Procedure: IMPLANTABLE CARDIOVERTER DEFIBRILLATOR IMPLANT;  Surgeon: Marinus Maw, MD;  Location: [facility] CATH LAB;  Service: Cardiovascular;  Laterality: N/A;   INSERT / REPLACE / REMOVE PACEMAKER     medtronic   LEFT AND RIGHT HEART CATHETERIZATION WITH CORONARY ANGIOGRAM N/A 09/24/2013   Procedure: LEFT AND RIGHT HEART CATHETERIZATION WITH CORONARY ANGIOGRAM;  Surgeon: Kathleene Hazel, MD;  Location: [facility] C[facility] LAB;  Service: Cardiovascular;  Laterality: N/A;   LITHOTRIPSY     3 different times    MEDIAN STERNOTOMY  2000   POLYPECTOMY  10/14/2018   Procedure: POLYPECTOMY;  Surgeon: Beverley Fiedler, MD;  Location: [facility];  Service: Gastroenterology;;   PULSE GENERATOR IMPLANT Right 11/13/2017   Procedure: Right chest implantable pulse generator change;  Surgeon: Maeola Harman, MD;  Location: [facility] OR;  Service: Neurosurgery;  Laterality: Right;  Right chest implantable pulse generator change   SUBTHALAMIC STIMULATOR BATTERY REPLACEMENT N/A 09/05/2012   Procedure: Deep brain stimulator battery change;  Surgeon: Maeola Harman, MD;  Location: [facility];  Service: Neurosurgery;  Laterality: N/A;  Deep brain stimulator battery change   SUBTHALAMIC STIMULATOR BATTERY REPLACEMENT N/A 06/10/2015   Procedure: Deep Brain stimulator battery change;  Surgeon: Maeola Harman, MD;  Location: [facility];  Service: Neurosurgery;  Laterality: N/A;   SUBTHALAMIC STIMULATOR BATTERY REPLACEMENT Right 09/25/2019   Procedure: Deep brain stimulator battery change;  Surgeon: Maeola Harman, MD;  Location: [facility] OR;  Service:  Neurosurgery;  Laterality: Right;   TONSILLECTOMY      Family History  Problem Relation Age of Onset   Aneurysm Mother    Alcoholism Father    HIV/AIDS Brother 72       AIDS   Healthy Sister    Healthy Child    Peripheral vascular disease Other    Arthritis Other    Healthy Sister    Healthy Child    Healthy Child    Diabetes Neg Hx    Heart disease Neg Hx    Colon cancer Neg Hx    Prostate cancer Neg Hx     Social History:  reports that he has never smoked. He has never been exposed to tobacco smoke. He has never used smokeless tobacco. He reports that he does not currently use alcohol. He reports that he does not use drugs.    Review of Systems     HYPERLIPIDEMIA:  He is on Crestor 20 mg with good control of LDL, has low HDL Triglycerides are most recently below 150, previously over 200, treated with Lovaza 2 g twice a day HDL last 36      Lab Results  Component Value Date    CHOL 105 07/28/2022   CHOL 115 12/27/2021   CHOL 103 05/31/2021   Lab Results  Component Value Date   HDL 31 (L) 07/28/2022   HDL 36.20 (L) 12/27/2021   HDL 29.10 (L) 05/31/2021   Lab Results  Component Value Date   LDLCALC 53 07/28/2022   LDLCALC 53 12/27/2021   LDLCALC 39 05/31/2021   Lab Results  Component Value Date   TRIG 129 07/28/2022   TRIG 128.0 12/27/2021   TRIG 172.0 (H) 05/31/2021   Lab Results  Component Value Date   CHOLHDL 3.4 07/28/2022   CHOLHDL 3 12/27/2021   CHOLHDL 4 05/31/2021   Lab Results  Component Value Date   LDLDIRECT 51.0 04/24/2019   LDLDIRECT 54.0 09/18/2018   LDLDIRECT 67.0 03/12/2018                   HYPOTHYROIDISM:   He has had hypothyroidism following radioactive iodine treatment for hyperthyroidism in 2013  Last TSH slightly higher than normal with taking 137 mcg levothyroxine, 8 tablets a week He thinks he has had more fatigue also  Lab Results  Component Value Date   TSH 5.21 (H) 07/28/2022   TSH 2.70 12/27/2021   TSH 5.58 (H) 08/31/2021   FREET4 1.1 07/28/2022   FREET4 0.77 04/24/2019   FREET4 0.88 08/16/2018   Has chronic systolic CHF Prescribed 80 mg Lasix daily without metolazone  Blood pressure history:  BP Readings from Last 3 Encounters:  07/31/22 118/65  07/18/22 108/60  06/06/22 122/80    CKD: Has variable creatinine levels as follows: Has been followed by nephrologist, Marcelline Deist was stopped when he had UTI in 5/22 No   microalbuminuria   He was treated with LOSARTAN 25 mg by his cardiologist   Lab Results  Component Value Date   CREATININE 1.37 (H) 07/28/2022   CREATININE 1.20 12/20/2021   CREATININE 1.29 11/25/2021    Neuropathy: Taking gabapentin bid from his PCP. Has history of numbness in his toes  Diabetic foot exam in 9/22   Physical Examination:  BP 118/65 (BP Location: Left Arm, Patient Position: Sitting, Cuff Size: Normal)   Pulse 67   Ht 5\' 9"  (1.753 m)   Wt 258 lb (117 kg)    SpO2 97%   BMI  38.10 kg/m   Diabetic Foot Exam - Simple   Simple Foot Form Diabetic Foot exam was performed with the following findings: Yes   Visual Inspection No deformities, no ulcerations, no other skin breakdown bilaterally: Yes Sensation Testing Intact to touch and monofilament testing bilaterally: Yes Pulse Check See comments: Yes Comments Pedal pulses not palpable, 1+ ankle edema       ASSESSMENT/PLAN  Diabetes type 2, insulin-dependent with obesity     See history of present illness for detailed discussion of his current management, blood sugar patterns and problems identified  His A1c is consistently at target and now 6.2  He is using the Omnipod 5 insulin pump but recently without the Dexcom Not labile is not able to get his supplies from the DME which has been fairly reliable previously  He only is checking fasting readings with the [facility] brand monitor lately  Recommendations: Continue 7.5 mg Mounjaro and try to get her into a local drugstore since his not getting it from his mail order company  He has been on his 62-month supply but at the end he can switch to 7.5 mg which may help reduce his insulin requirement and more weight loss Reminded him to bolus with every meal and preferably a few minutes before starting to eat  HYPOTHYROIDISM: TSH is higher and he has some fatigue Will go up to 175 mcg levothyroxine instead of the average dose of 157 TSH to be checked on the next visit  Mixed Hyperlipidemia: well-controlled on Crestor and Lovaza  Neuropathy: Symptoms well-controlled, foot exam shows no significant sensory loss today  Abnormal creatinine: Will forward results to cardiologist  Patient Instructions  Check blood sugars on waking up 4 days a week  Also check blood sugars about 2 hours after meals and do this after different meals by rotation  Recommended blood sugar levels on waking up are 90-130 and about 2 hours after meal is 130-160  Please  bring your blood sugar monitor to each visit, thank you    Follow-up in 4 months    Maylon Sailors 07/31/2022, 3:05 PM   Note: This office note was prepared with Dragon voice recognition system technology. Any transcriptional errors that result from this process are unintentional.

## 2022-07-31 NOTE — Patient Instructions (Signed)
Check blood sugars on waking up 4  days a week  Also check blood sugars about 2 hours after meals and do this after different meals by rotation  Recommended blood sugar levels on waking up are 90-130 and about 2 hours after meal is 130-160  Please bring your blood sugar monitor to each visit, thank you  

## 2022-08-01 ENCOUNTER — Telehealth: Payer: Self-pay | Admitting: *Deleted

## 2022-08-01 DIAGNOSIS — I5022 Chronic systolic (congestive) heart failure: Secondary | ICD-10-CM

## 2022-08-01 MED ORDER — FUROSEMIDE 80 MG PO TABS: ORAL_TABLET | ORAL | Status: DC

## 2022-08-01 MED ORDER — FUROSEMIDE 20 MG PO TABS: ORAL_TABLET | ORAL | 3 refills | Status: DC

## 2022-08-01 NOTE — Telephone Encounter (Signed)
Spoke with pt wife, she will cut the 80 mg tablets in 1/2 and script sent to the pharmacy for the 20 mg tablet to be taken with the 40 mg once daily to equal total of 60 mg once daily. Lab orders mailed to the pt

## 2022-08-01 NOTE — Telephone Encounter (Signed)
-----   Message from Olga Millers sent at 07/31/2022  4:16 PM EDT ----- Decrease lasix to 60 mg daily with bmet one week Olga Millers ----- Message ----- From: Reather Littler, MD Sent: 07/31/2022   4:12 PM EDT To: Lewayne Bunting, MD  Creatinine appears to be rising, please assess need for reducing diuretic

## 2022-08-04 ENCOUNTER — Telehealth: Payer: Self-pay

## 2022-08-04 NOTE — Telephone Encounter (Signed)
Patient needs clinical notes and rx faxed into [facility]. Message was sent to me directly. Order has already been processed, in the final stages and need above information

## 2022-08-07 ENCOUNTER — Encounter: Payer: Self-pay | Admitting: Endocrinology

## 2022-08-07 ENCOUNTER — Encounter: Payer: Self-pay | Admitting: Neurology

## 2022-08-14 ENCOUNTER — Encounter: Payer: Self-pay | Admitting: Cardiology

## 2022-08-16 DIAGNOSIS — I5022 Chronic systolic (congestive) heart failure: Secondary | ICD-10-CM | POA: Diagnosis not present

## 2022-08-28 ENCOUNTER — Other Ambulatory Visit: Payer: Self-pay | Admitting: Neurology

## 2022-08-28 DIAGNOSIS — G20A1 Parkinson's disease without dyskinesia, without mention of fluctuations: Secondary | ICD-10-CM

## 2022-08-28 DIAGNOSIS — G253 Myoclonus: Secondary | ICD-10-CM

## 2022-08-28 DIAGNOSIS — G20A2 Parkinson's disease without dyskinesia, with fluctuations: Secondary | ICD-10-CM

## 2022-09-01 ENCOUNTER — Other Ambulatory Visit: Payer: Self-pay | Admitting: Family Medicine

## 2022-09-01 DIAGNOSIS — I509 Heart failure, unspecified: Secondary | ICD-10-CM

## 2022-09-04 DIAGNOSIS — H35371 Puckering of macula, right eye: Secondary | ICD-10-CM | POA: Diagnosis not present

## 2022-09-04 DIAGNOSIS — H4031X3 Glaucoma secondary to eye trauma, right eye, severe stage: Secondary | ICD-10-CM | POA: Diagnosis not present

## 2022-09-04 DIAGNOSIS — H40032 Anatomical narrow angle, left eye: Secondary | ICD-10-CM | POA: Diagnosis not present

## 2022-09-04 DIAGNOSIS — H35341 Macular cyst, hole, or pseudohole, right eye: Secondary | ICD-10-CM | POA: Diagnosis not present

## 2022-09-04 LAB — HM DIABETES EYE EXAM

## 2022-09-12 ENCOUNTER — Encounter: Payer: Self-pay | Admitting: Physical Medicine & Rehabilitation

## 2022-09-12 ENCOUNTER — Encounter: Payer: Self-pay | Admitting: Family Medicine

## 2022-09-12 ENCOUNTER — Encounter: Payer: Medicare Other | Attending: Physical Medicine & Rehabilitation | Admitting: Physical Medicine & Rehabilitation

## 2022-09-12 VITALS — BP 128/77 | HR 70 | Ht 69.0 in | Wt 258.8 lb

## 2022-09-12 DIAGNOSIS — G248 Other dystonia: Secondary | ICD-10-CM | POA: Diagnosis not present

## 2022-09-12 NOTE — Progress Notes (Signed)
Subjective:    Patient ID: Henry [name], male    DOB: 22-Jul-1950, 72 y.o.   MRN: [id number] Patient referred by Dr. Arbutus Leas HPI 72 year old male with Parkinson's disease status post deep brain stimulator who was referred by his movement disorders neurologist for the evaluation of right foot dystonia.  1 year hx of Right foot turning   THis occurs mainly when pt is ambulating with his walker.  Pt is unclear whether Right foot is turning in or out.  Pt feels like symptoms are worsening over time  Pt's wife notes that when she is helping him put socks on she needs to "chase his foot around"  No nocturnal spasms The patient does not have any pain during these episodes. Pain Inventory Average Pain 0 Pain Right Now 0 My pain is  no pain  In the last 24 hours, has pain interfered with the following? General activity 0 Relation with others 0 Enjoyment of life 0 What TIME of day is your pain at its worst? No pain Sleep (in general) NA  Pain is worse with:  no pain Pain improves with:  no pain Relief from Meds:  no pain  walk with assistance use a walker how many minutes can you walk? 5-15 ability to climb steps?  yes do you drive?  no  retired I need assistance with the following:  dressing, bathing, toileting, meal prep, household duties, and shopping  tremor trouble walking loss of taste or smell  Any changes since last visit?  no  Any changes since last visit?  no    Family History  Problem Relation Age of Onset   Aneurysm Mother    Alcoholism Father    HIV/AIDS Brother 90       AIDS   Healthy Sister    Healthy Child    Peripheral vascular disease Other    Arthritis Other    Healthy Sister    Healthy Child    Healthy Child    Diabetes Neg Hx    Heart disease Neg Hx    Colon cancer Neg Hx    Prostate cancer Neg Hx    Social History   Socioeconomic History   Marital status: Married    Spouse name: [name]   Number of children: 2   Years of education: 12    Highest education level: High school graduate  Occupational History   Occupation: DISABLED    Comment: [employer]  Tobacco Use   Smoking status: Never    Passive exposure: Never   Smokeless tobacco: Never  Vaping Use   Vaping status: Never Used  Substance and Sexual Activity   Alcohol use: Not Currently   Drug use: No   Sexual activity: Not Currently  Other Topics Concern   Not on file  Social History Narrative   From [place]   Retired/disability Archivist   Likes to fish.     Married 1972   3 kids   [place] fan   Social Determinants of Health   Financial Resource Strain: Low Risk  (12/23/2020)   Overall Financial Resource Strain (CARDIA)    Difficulty of Paying Living Expenses: Not hard at all  Food Insecurity: No Food Insecurity (12/28/2021)   Hunger Vital Sign    Worried About Running Out of Food in the Last Year: Never true    Ran Out of Food in the Last Year: Never true  Transportation Needs: No Transportation Needs (12/28/2021)   PRAPARE - Transportation  Lack of Transportation (Medical): No    Lack of Transportation (Non-Medical): No  Physical Activity: Inactive (12/28/2021)   Exercise Vital Sign    Days of Exercise per Week: 0 days    Minutes of Exercise per Session: 0 min  Stress: No Stress Concern Present (12/28/2021)   [employer] of Occupational Health - Occupational Stress Questionnaire    Feeling of Stress : Not at all  Social Connections: Socially Integrated (12/28/2021)   Social Connection and Isolation Panel [NHANES]    Frequency of Communication with Friends and Family: More than three times a week    Frequency of Social Gatherings with Friends and Family: More than three times a week    Attends Religious Services: More than 4 times per year    Active Member of Clubs or Organizations: Yes    Attends Engineer, structural: More than 4 times per year    Marital Status: Married   Past Surgical  History:  Procedure Laterality Date   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   BIOPSY  10/14/2018   Procedure: BIOPSY;  Surgeon: Beverley Fiedler, MD;  Location: [facility];  Service: Gastroenterology;;   CARDIAC CATHETERIZATION     CATARACT EXTRACTION W/ INTRAOCULAR LENS IMPLANT     Hx: of right eye   CATARACT EXTRACTION W/ INTRAOCULAR LENS IMPLANT Left 2018   COLONOSCOPY N/A 10/13/2014   Procedure: COLONOSCOPY;  Surgeon: Beverley Fiedler, MD;  Location: [facility];  Service: Gastroenterology;  Laterality: N/A;   COLONOSCOPY W/ BIOPSIES AND POLYPECTOMY     Hx: of   COLONOSCOPY WITH PROPOFOL N/A 10/14/2018   Procedure: COLONOSCOPY WITH PROPOFOL;  Surgeon: Beverley Fiedler, MD;  Location: [facility];  Service: Gastroenterology;  Laterality: N/A;   CORONARY ARTERY BYPASS GRAFT  2000   Tressie Stalker, MD   CORONARY STENT PLACEMENT  1998   DEEP BRAIN STIMULATOR PLACEMENT  2004   Right and left VIN stimulator placement (parkinsons)   EYE SURGERY     FINGER AMPUTATION     left pointer   IMPLANTABLE CARDIOVERTER DEFIBRILLATOR IMPLANT N/A 11/13/2013   Procedure: IMPLANTABLE CARDIOVERTER DEFIBRILLATOR IMPLANT;  Surgeon: Marinus Maw, MD;  Location: [facility] CATH LAB;  Service: Cardiovascular;  Laterality: N/A;   INSERT / REPLACE / REMOVE PACEMAKER     medtronic   LEFT AND RIGHT HEART CATHETERIZATION WITH CORONARY ANGIOGRAM N/A 09/24/2013   Procedure: LEFT AND RIGHT HEART CATHETERIZATION WITH CORONARY ANGIOGRAM;  Surgeon: Kathleene Hazel, MD;  Location: [facility] CATH LAB;  Service: Cardiovascular;  Laterality: N/A;   LITHOTRIPSY     3 different times   MEDIAN STERNOTOMY  2000   POLYPECTOMY  10/14/2018   Procedure: POLYPECTOMY;  Surgeon: Beverley Fiedler, MD;  Location: [facility];  Service: Gastroenterology;;   PULSE GENERATOR IMPLANT Right 11/13/2017   Procedure: Right chest implantable pulse generator change;  Surgeon: Maeola Harman, MD;  Location: [facility] OR;  Service: Neurosurgery;  Laterality:  Right;  Right chest implantable pulse generator change   SUBTHALAMIC STIMULATOR BATTERY REPLACEMENT N/A 09/05/2012   Procedure: Deep brain stimulator battery change;  Surgeon: Maeola Harman, MD;  Location: [facility];  Service: Neurosurgery;  Laterality: N/A;  Deep brain stimulator battery change   SUBTHALAMIC STIMULATOR BATTERY REPLACEMENT N/A 06/10/2015   Procedure: Deep Brain stimulator battery change;  Surgeon: Maeola Harman, MD;  Location: [facility];  Service: Neurosurgery;  Laterality: N/A;   SUBTHALAMIC STIMULATOR BATTERY REPLACEMENT Right 09/25/2019   Procedure: Deep  brain stimulator battery change;  Surgeon: Maeola Harman, MD;  Location[facility] OR;  Service: Neurosurgery;  Laterality: Right;   TONSILLECTOMY     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   Dysrhythmia    a-fib   GERD (gastroesophageal reflux disease)    Glaucoma    right eye   History of kidney stones    multiple   HLD (hyperlipidemia)    HTN (hypertension)    pt denies 08/19/12   Hyperplasia, prostate    Hypothyroidism    MI (myocardial infarction) (HCC)    Dr Jens Som 2000, x3vessels bypass   OSA (obstructive sleep apnea)    AHI-28,on CPAP, noncompliant with CPAP   Parkinson disease    1999   PONV (postoperative nausea and vomiting)    Restless legs    Shortness of breath    Hx: of at all times   UTI (lower urinary tract infection) 09/15/12   Klebsiella   Ventral hernia    Walker as ambulation aid    also uses wheelchair at home/when going out   BP 128/77   Pulse 70   Ht 5\' 9"  (1.753 m)   Wt 258 lb 12.8  oz (117.4 kg)   SpO2 99%   BMI 38.22 kg/m   Opioid Risk Score:   Fall Risk Score:  `1  Depression screen [facility] 2/9     09/12/2022    2:58 PM 03/03/2022   12:30 PM 12/28/2021   12:43 PM 12/23/2020   11:25 AM 08/16/2018   12:16 PM 04/13/2017    1:52 PM 04/04/2017    8:58 AM  Depression screen PHQ 2/9  Decreased Interest 0 0 0 0 0 3 0  Down, Depressed, Hopeless 1 0 0 0 0 3 0  PHQ - 2 Score 1 0 0 0 0 6 0  Altered sleeping 1 0   3  0  Tired, decreased energy 1 0   0  0  Change in appetite 0 0   0  0  Feeling bad or failure about yourself  0 0   0  0  Trouble concentrating 1 0   0  0  Moving slowly or fidgety/restless 3 0   0  0  Suicidal thoughts 0 0   0 0 0  PHQ-9 Score 7 0   3  0  Difficult doing work/chores Somewhat difficult Not difficult at all   Not difficult at all Somewhat difficult Not difficult at all     Review of Systems  Constitutional:        Loss of taste  HENT: Negative.    Eyes: Negative.   Respiratory: Negative.    Cardiovascular: Negative.   Gastrointestinal: Negative.   Endocrine: Negative.   Genitourinary: Negative.   Musculoskeletal:  Positive for gait problem.  Skin: Negative.   Allergic/Immunologic: Negative.   Neurological:  Positive for tremors.       Possible foot drop  Hematological: Negative.  Psychiatric/Behavioral: Negative.    All other systems reviewed and are negative.      Objective:   Physical Exam  General No acute distress Mood and affect appropriate Speech with hypophonic hyperkinetic dysarthria Masked facies Motor strength is 5/5 bilateral deltoid bicep tricep grip as well as hip flexor knee extensor ankle dorsiflexor and plantar flexor bilaterally. Finger-nose-finger testing is intact bilaterally there is no evidence of dysdiadochokinesis with rapid alternating supination full pronation of the upper extremities. Ambulation is with a rolling walker the patient has external rotation at the hip.  Palpation during gait cycle shows  hyperactivity of the gluteus medius musculature on the right side. The right foot both inverts and everts.  The patient has initial eversion followed by inversion.  The patient has a minimal foot drop but at times also has excessive dorsiflexion.      Assessment & Plan:  #1.  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  Patient is status post deep brain stimulator placement. The patient has a combination of inversion and eversion of the foot.  The external rotation at the hip appears fairly consistent during swing phase.  We discussed usual onset of effect for Botox as well as duration of effect.  We discussed that at times patients may experience excessive weakness of certain muscles but this should be temporary.  We discussed the potential need to make fine-tuning adjustments to dosing.   Xeomin   Right Glut medius 75 RIght Tib post 75 Right peroneus long 50

## 2022-10-05 ENCOUNTER — Other Ambulatory Visit: Payer: Self-pay | Admitting: Neurology

## 2022-10-05 DIAGNOSIS — G20A1 Parkinson's disease without dyskinesia, without mention of fluctuations: Secondary | ICD-10-CM

## 2022-10-07 ENCOUNTER — Encounter: Payer: Self-pay | Admitting: Endocrinology

## 2022-10-09 ENCOUNTER — Other Ambulatory Visit: Payer: Self-pay

## 2022-10-09 DIAGNOSIS — E1165 Type 2 diabetes mellitus with hyperglycemia: Secondary | ICD-10-CM

## 2022-10-09 MED ORDER — OMNIPOD 5 DEXG7G6 PODS GEN 5 MISC
3 refills | Status: DC
Start: 2022-10-09 — End: 2023-01-30

## 2022-10-09 MED ORDER — OMNIPOD 5 DEXG7G6 PODS GEN 5 MISC: 3 refills | Status: DC

## 2022-10-10 ENCOUNTER — Encounter: Payer: Medicare Other | Attending: Physical Medicine & Rehabilitation | Admitting: Physical Medicine & Rehabilitation

## 2022-10-10 ENCOUNTER — Encounter: Payer: Self-pay | Admitting: Physical Medicine & Rehabilitation

## 2022-10-10 VITALS — BP 129/81 | HR 71 | Temp 97.4°F | Ht 69.0 in | Wt 258.0 lb

## 2022-10-10 DIAGNOSIS — G248 Other dystonia: Secondary | ICD-10-CM | POA: Insufficient documentation

## 2022-10-10 MED ORDER — INCOBOTULINUMTOXINA 100 UNITS IM SOLR
200.0000 [IU] | Freq: Once | INTRAMUSCULAR | Status: AC
Start: 2022-10-10 — End: 2022-10-10
  Administered 2022-10-10: 200 [IU] via INTRAMUSCULAR

## 2022-10-10 MED ORDER — SODIUM CHLORIDE (PF) 0.9 % IJ SOLN
2.0000 mL | Freq: Once | INTRAMUSCULAR | Status: AC
Start: 2022-10-10 — End: 2022-10-10
  Administered 2022-10-10: 2 mL

## 2022-10-10 NOTE — Progress Notes (Signed)
Botox Injection for spasticity using needle EMG guidance  Dilution: 50 units/ml Indication: Severe spasticity which interferes with ADL,mobility and/or  hygiene and is unresponsive to medication management and other conservative care Informed consent was obtained after describing risks and benefits of the procedure with the patient. This includes bleeding, bruising, infection, excessive weakness, or medication side effects. A REMS form is on file and signed. Needle: 25-gauge 2 inch needle electrode Number of units per muscle Right tibialis posterior 75 units Right peroneus longus 50 units Right gluteus medius 75 units All injections were done after obtaining appropriate EMG activity and after negative drawback for blood. The patient tolerated the procedure well. Post procedure instructions were given. A followup appointment was made.

## 2022-10-10 NOTE — Patient Instructions (Signed)

## 2022-10-12 ENCOUNTER — Other Ambulatory Visit: Payer: Self-pay | Admitting: Family Medicine

## 2022-10-12 NOTE — Telephone Encounter (Signed)
Refill request for traMADol (ULTRAM) 50 MG tablet   LOV - 03/03/22 Next OV - not scheduled Last refill - 07/12/22 #60/2

## 2022-10-12 NOTE — Telephone Encounter (Signed)
Sent. Thanks.   

## 2022-10-17 ENCOUNTER — Other Ambulatory Visit: Payer: Self-pay | Admitting: Neurology

## 2022-10-17 DIAGNOSIS — R443 Hallucinations, unspecified: Secondary | ICD-10-CM

## 2022-10-17 DIAGNOSIS — N1832 Chronic kidney disease, stage 3b: Secondary | ICD-10-CM | POA: Diagnosis not present

## 2022-10-17 DIAGNOSIS — G20A1 Parkinson's disease without dyskinesia, without mention of fluctuations: Secondary | ICD-10-CM

## 2022-10-18 ENCOUNTER — Ambulatory Visit (INDEPENDENT_AMBULATORY_CARE_PROVIDER_SITE_OTHER): Payer: Medicare Other

## 2022-10-18 DIAGNOSIS — I255 Ischemic cardiomyopathy: Secondary | ICD-10-CM | POA: Diagnosis not present

## 2022-10-18 LAB — CUP PACEART REMOTE DEVICE CHECK
Battery Remaining Longevity: 18 mo
Battery Voltage: 2.92 V
Brady Statistic AP VP Percent: 0 %
Brady Statistic AP VS Percent: 2.32 %
Brady Statistic AS VP Percent: 0.06 %
Brady Statistic AS VS Percent: 97.62 %
Brady Statistic RA Percent Paced: 2.31 %
Brady Statistic RV Percent Paced: 0.06 %
Date Time Interrogation Session: 20241009012405
HighPow Impedance: 69 Ohm
Implantable Lead Connection Status: 753985
Implantable Lead Connection Status: 753985
Implantable Lead Implant Date: 20151105
Implantable Lead Implant Date: 20151105
Implantable Lead Location: 753859
Implantable Lead Location: 753860
Implantable Lead Model: 5076
Implantable Pulse Generator Implant Date: 20151105
Lead Channel Impedance Value: 437 Ohm
Lead Channel Impedance Value: 456 Ohm
Lead Channel Impedance Value: 494 Ohm
Lead Channel Pacing Threshold Amplitude: 0.5 V
Lead Channel Pacing Threshold Amplitude: 1.125 V
Lead Channel Pacing Threshold Pulse Width: 0.4 ms
Lead Channel Pacing Threshold Pulse Width: 0.4 ms
Lead Channel Sensing Intrinsic Amplitude: 1.125 mV
Lead Channel Sensing Intrinsic Amplitude: 1.125 mV
Lead Channel Sensing Intrinsic Amplitude: 18.375 mV
Lead Channel Sensing Intrinsic Amplitude: 18.375 mV
Lead Channel Setting Pacing Amplitude: 2.25 V
Lead Channel Setting Pacing Amplitude: 2.5 V
Lead Channel Setting Pacing Pulse Width: 0.4 ms
Lead Channel Setting Sensing Sensitivity: 0.3 mV
Zone Setting Status: 755011

## 2022-10-20 ENCOUNTER — Other Ambulatory Visit: Payer: Self-pay | Admitting: Family Medicine

## 2022-10-20 NOTE — Telephone Encounter (Signed)
You have not given lasix last ok to refill both?

## 2022-10-21 NOTE — Telephone Encounter (Signed)
I sent his metoprolol rx.  His lasix rx comes through cardiology and I routed that in the meantime.  I thank all involved.

## 2022-10-23 DIAGNOSIS — I509 Heart failure, unspecified: Secondary | ICD-10-CM | POA: Diagnosis not present

## 2022-10-23 DIAGNOSIS — E1122 Type 2 diabetes mellitus with diabetic chronic kidney disease: Secondary | ICD-10-CM | POA: Diagnosis not present

## 2022-10-23 DIAGNOSIS — D631 Anemia in chronic kidney disease: Secondary | ICD-10-CM | POA: Diagnosis not present

## 2022-10-23 DIAGNOSIS — N2581 Secondary hyperparathyroidism of renal origin: Secondary | ICD-10-CM | POA: Diagnosis not present

## 2022-10-23 DIAGNOSIS — G20A1 Parkinson's disease without dyskinesia, without mention of fluctuations: Secondary | ICD-10-CM | POA: Diagnosis not present

## 2022-10-23 DIAGNOSIS — I129 Hypertensive chronic kidney disease with stage 1 through stage 4 chronic kidney disease, or unspecified chronic kidney disease: Secondary | ICD-10-CM | POA: Diagnosis not present

## 2022-10-23 DIAGNOSIS — N1832 Chronic kidney disease, stage 3b: Secondary | ICD-10-CM | POA: Diagnosis not present

## 2022-10-24 LAB — LAB REPORT - SCANNED
Albumin, Urine POC: 3
Creatinine, POC: 26.1 mg/dL
EGFR: 67
Microalb Creat Ratio: 11

## 2022-10-29 ENCOUNTER — Other Ambulatory Visit: Payer: Self-pay | Admitting: Neurology

## 2022-10-30 ENCOUNTER — Encounter: Payer: Self-pay | Admitting: Endocrinology

## 2022-10-30 ENCOUNTER — Ambulatory Visit (INDEPENDENT_AMBULATORY_CARE_PROVIDER_SITE_OTHER): Payer: Medicare Other | Admitting: Endocrinology

## 2022-10-30 VITALS — BP 130/70 | HR 73 | Resp 20 | Ht 69.0 in | Wt 263.4 lb

## 2022-10-30 DIAGNOSIS — E1142 Type 2 diabetes mellitus with diabetic polyneuropathy: Secondary | ICD-10-CM

## 2022-10-30 DIAGNOSIS — E1122 Type 2 diabetes mellitus with diabetic chronic kidney disease: Secondary | ICD-10-CM | POA: Diagnosis not present

## 2022-10-30 DIAGNOSIS — E890 Postprocedural hypothyroidism: Secondary | ICD-10-CM

## 2022-10-30 DIAGNOSIS — N1831 Chronic kidney disease, stage 3a: Secondary | ICD-10-CM | POA: Diagnosis not present

## 2022-10-30 DIAGNOSIS — Z794 Long term (current) use of insulin: Secondary | ICD-10-CM | POA: Diagnosis not present

## 2022-10-30 DIAGNOSIS — E118 Type 2 diabetes mellitus with unspecified complications: Secondary | ICD-10-CM | POA: Diagnosis not present

## 2022-10-30 LAB — POCT GLYCOSYLATED HEMOGLOBIN (HGB A1C): Hemoglobin A1C: 6.5 % — AB (ref 4.0–5.6)

## 2022-10-30 MED ORDER — ICOSAPENT ETHYL 1 G PO CAPS: 2.0000 g | ORAL_CAPSULE | Freq: Two times a day (BID) | ORAL | 1 refills | Status: DC

## 2022-10-30 NOTE — Progress Notes (Unsigned)
Outpatient Endocrinology Note [place] Latarra Eagleton, MD  10/30/22  Patient's Name: Henry [name]    DOB: April 02, 1950    MRN: [id number]                                                    REASON OF VISIT: Follow-up for type 2 diabetes mellitus  PCP: Joaquim Nam, MD  HISTORY OF PRESENT ILLNESS:   Henry [name] is a 72 y.o. old male with past medical history listed below, is here for follow up of  type 2 diabetes mellitus /hypothyroidism.  Patient was last seen by Dr. Lucianne Muss in July 2024.  Pertinent Diabetes History: Patient was diagnosed with type 2 diabetes mellitus in 2000.  He has been on OmniPod 5 insulin pump with Dexcom G6.  Patient's wife is helping with insulin pump management.  Patient has severe Parkinson's disease.  Chronic Diabetes Complications : Retinopathy: no. Last ophthalmology exam was done on annually reportedly. Nephropathy: CKD, on losartan, following with nephrology. Peripheral neuropathy:  yes, taking gabapentin from PCP.  Has history of numbness in his toes. Coronary artery disease: CAD. Has CHF Stroke: no.  He has Parkinson's disease.  Relevant comorbidities and cardiovascular risk factors: Obesity: yes Body mass index is 38.9 kg/m.  Hypertension: yes Hyperlipidemia. Yes, on statin.   Current / Home Diabetic regimen includes:  Mounjaro 7.5 mg weekly.  OmniPod 5 insulin Pump setting:  Basal MN- 1.3u/hour 3:30AM-1.6  6AM- 2.0 12PM- 2.5 10PM - 2.2  Bolus CHO Ratio (1unit:CHO) MN- 1:10  Generally Boluses 3-5 units for breakfast, 4-6 lunch and 6-7 units for dinner, 2-3 units for snacks . Generally entering about 45 -55- 65 g of carbohydrate for the 3 meals   Correction/Sensitivity: MN- 1:6  Target: 120   Active insulin time: 4 hours  Prior diabetic medications: Amaryl several years ago and probably metformin.  Janumet.  Victoza.  Lantus.  V-Go pump in 2016. Marcelline Deist was stopped when he had UTI in May 2022.  CONTINUOUS GLUCOSE MONITORING  SYSTEM (CGMS) / INSULIN PUMP INTERPRETATION:                         OmniPod 5 Pump & DEXCOM G6 Sensor Download (Reviewed and summarized below.) Pump: Dexcom G6 and OmniPod 5 Dates: October 8 to October 30, 2022 for 14 days.   Average daily carbs entered: 155.5 g/day Average total daily insulin:  57.6 units, Basal: 79%%, Bolus: 21%.   Automated mode in Use: 0%  He has been using sensor with the Dexcom, with the pump however in manual mode 100%.  CONTINUOUS GLUCOSE MONITORING SYSTEM (CGMS) INTERPRETATION: At today's visit, we reviewed CGM downloads. The full report is scanned in the media. Reviewing the CGM trends, blood glucose are as follows:  Dexcom G6 CGM-  Sensor Download (Sensor download was reviewed and summarized below.) Dates: October 8 to October 30, 2022  Glucose Management Indicator: 7.2% Sensor Average: 157 CGM/Sensor usage:96%  Glycemic Trends:  <54: 0% 54-70: 0% 71-180: 76% 181-250: 22% 251-400: 2%  Interpretation: -Mostly acceptable blood sugar.  Occasional postprandial hyperglycemia with blood sugar up to 200 range, at times related with known meal bolus.  He has been bolusing meals 2-4 times a day.  Blood sugar overnight and in between the meals mostly acceptable.  No hypoglycemia.  Hypoglycemia:  Patient has no hypoglycemic episodes. Patient has hypoglycemia awareness,.    Factors modifying glucose control: 1.  Diabetic diet assessment: 3 meals a day.  2.  Staying active or exercising: Not able to exercise due to medical condition.  3.  Medication compliance: compliant all of the time.  # Postablative hypothyroidism.  Patient has hypothyroidism secondary to radioactive iodine treatment for hyperthyroidism in 2013.  Interval history 10/30/22 Pump and CGM data as reviewed above.  Patient has been using manual mode 100%, has not been using on automatic mode although using with Dexcom G6 compatible with OmniPod.  Patient has been taking levothyroxine 137  mcg daily.  Check take medication bottle back to the clinic today.  Based on Dr. Remus Blake endocrinology note in July levothyroxine was increased from 137 to 175 mcg daily when TSH elevated to 5.21.  However patient has been taking 137 mcg daily, no additional tablet in 1 week.  No GI issues related with Mounjaro.  No other complaints today.  Patient is accompanied by his wife in the clinic today.  REVIEW OF SYSTEMS As per history of present illness.   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   Dysrhythmia    a-fib   GERD (gastroesophageal reflux disease)    Glaucoma    right eye   History of kidney stones    multiple   HLD (hyperlipidemia)    HTN (hypertension)    pt denies 08/19/12   Hyperplasia, prostate    Hypothyroidism    MI (myocardial infarction) (HCC)    Dr Jens Som 2000, x3vessels bypass   OSA (obstructive sleep apnea)    AHI-28,on CPAP, noncompliant with CPAP   Parkinson disease (HCC)    1999   PONV (postoperative nausea and vomiting)    Restless legs    Shortness of breath    Hx: of at all times   UTI (lower urinary tract infection) 09/15/12   Klebsiella   Ventral hernia    Walker as ambulation aid    also uses wheelchair at home/when going out    PAST SURGICAL HISTORY: 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   BIOPSY  10/14/2018   Procedure: BIOPSY;  Surgeon: Beverley Fiedler, MD;  Location: [facility];  Service: Gastroenterology;;   CARDIAC CATHETERIZATION     CATARACT  EXTRACTION W/ INTRAOCULAR LENS IMPLANT     Hx: of right eye   CATARACT EXTRACTION W/ INTRAOCULAR LENS IMPLANT Left 2018   COLONOSCOPY N/A 10/13/2014   Procedure: COLONOSCOPY;  Surgeon: Beverley Fiedler, MD;  Location: [facility];  Service: Gastroenterology;  Laterality: N/A;   COLONOSCOPY W/ BIOPSIES AND POLYPECTOMY     Hx: of   COLONOSCOPY WITH PROPOFOL N/A 10/14/2018   Procedure: COLONOSCOPY WITH PROPOFOL;  Surgeon: Beverley Fiedler, MD;  Location: [facility];  Service: Gastroenterology;  Laterality: N/A;   CORONARY ARTERY BYPASS  GRAFT  2000   Tressie Stalker, MD   CORONARY STENT PLACEMENT  1998   DEEP BRAIN STIMULATOR PLACEMENT  2004   Right and left VIN stimulator placement (parkinsons)   EYE SURGERY     FINGER AMPUTATION     left pointer   IMPLANTABLE CARDIOVERTER DEFIBRILLATOR IMPLANT N/A 11/13/2013   Procedure: IMPLANTABLE CARDIOVERTER DEFIBRILLATOR IMPLANT;  Surgeon: Marinus Maw, MD;  Location[facility];  Service: Cardiovascular;  Laterality: N/A;   INSERT / REPLACE / REMOVE PACEMAKER     medtronic   LEFT AND RIGHT HEART CATHETERIZATION WITH CORONARY ANGIOGRAM N/A 09/24/2013   Procedure: LEFT AND RIGHT HEART CATHETERIZATION WITH CORONARY ANGIOGRAM;  Surgeon: Kathleene Hazel, MD;  Location: [facility] CATH LAB;  Service: Cardiovascular;  Laterality: N/A;   LITHOTRIPSY     3 different times   MEDIAN STERNOTOMY  2000   POLYPECTOMY  10/14/2018   Procedure: POLYPECTOMY;  Surgeon: Beverley Fiedler, MD;  Location: [facility];  Service: Gastroenterology;;   PULSE GENERATOR IMPLANT Right 11/13/2017   Procedure: Right chest implantable pulse generator change;  Surgeon: Maeola Harman, MD;  Location: [facility] OR;  Service: Neurosurgery;  Laterality: Right;  Right chest implantable pulse generator change   SUBTHALAMIC STIMULATOR BATTERY REPLACEMENT N/A 09/05/2012   Procedure: Deep brain stimulator battery change;  Surgeon: Maeola Harman, MD;  Location: [facility];  Service: Neurosurgery;  Laterality:  N/A;  Deep brain stimulator battery change   SUBTHALAMIC STIMULATOR BATTERY REPLACEMENT N/A 06/10/2015   Procedure: Deep Brain stimulator battery change;  Surgeon: Maeola Harman, MD;  Location: [facility];  Service: Neurosurgery;  Laterality: N/A;   SUBTHALAMIC STIMULATOR BATTERY REPLACEMENT Right 09/25/2019   Procedure: Deep brain stimulator battery change;  Surgeon: Maeola Harman, MD;  Location: [facility] OR;  Service: Neurosurgery;  Laterality: Right;   TONSILLECTOMY      ALLERGIES: Allergies  Allergen Reactions   Penicillins Anaphylaxis and Rash    Because of a history of documented adverse serious drug reaction;Medi Alert bracelet  is recommended PATIENT HAS HAD A PCN REACTION WITH IMMEDIATE RASH, FACIAL/TONGUE/THROAT SWELLING, SOB, OR LIGHTHEADEDNESS WITH HYPOTENSION:  #  #  YES  #  #  Has patient had a PCN reaction causing severe rash involving mucus membranes or skin necrosis: unknown Has patient had a PCN reaction that required hospitalization NO Has patient had a PCN reaction occurring within the last 10 years: NO   Klonopin [Clonazepam] Other (See Comments)    agitation   Peanut-Containing Drug Products Cough   Watermelon [Citrullus Vulgaris] Other (See Comments) and Cough    Tickle in throat    FAMILY HISTORY:  Family History  Problem Relation Age of Onset   Aneurysm Mother    Alcoholism Father    HIV/AIDS Brother 82       AIDS   Healthy Sister    Healthy Child    Peripheral vascular disease Other    Arthritis Other    Healthy Sister    Healthy Child    Healthy Child    Diabetes Neg Hx    Heart disease Neg Hx    Colon cancer Neg Hx    Prostate cancer Neg Hx     SOCIAL HISTORY: Social History   Socioeconomic History   Marital status: Married    Spouse name: [name]   Number of children: 2   Years of education: 12   Highest education level: High school graduate  Occupational History   Occupation: DISABLED    Comment:  [employer]  Tobacco Use    Smoking status: Never    Passive exposure: Never   Smokeless tobacco: Never  Vaping Use   Vaping status: Never Used  Substance and Sexual Activity   Alcohol use: Not Currently   Drug use: No   Sexual activity: Not Currently  Other Topics Concern   Not on file  Social History Narrative   From [facility]   Retired/disability Archivist   Likes to fish.     Married 1972   3 kids   [place] fan   Social Determinants of Health   Financial Resource Strain: Low Risk  (12/23/2020)   Overall Financial Resource Strain (CARDIA)    Difficulty of Paying Living Expenses: Not hard at all  Food Insecurity: No Food Insecurity (12/28/2021)   Hunger Vital Sign    Worried About Running Out of Food in the Last Year: Never true    Ran Out of Food in the Last Year: Never true  Transportation Needs: No Transportation Needs (12/28/2021)   PRAPARE - Administrator, Civil Service (Medical): No    Lack of Transportation (Non-Medical): No  Physical Activity: Inactive (12/28/2021)   Exercise Vital Sign    Days of Exercise per Week: 0 days    Minutes of Exercise per Session: 0 min  Stress: No Stress Concern Present (12/28/2021)   [employer] of Occupational Health - Occupational Stress Questionnaire    Feeling of Stress : Not at all  Social Connections: Socially Integrated (12/28/2021)   Social Connection and Isolation Panel [NHANES]    Frequency of Communication with Friends and Family: More than three times a week    Frequency of Social Gatherings with Friends and Family: More than three times a week    Attends Religious Services: More than 4 times per year    Active Member of [employer] or Organizations: Yes    Attends Engineer, structural: More than 4 times per year    Marital Status: Married    MEDICATIONS:  Current Outpatient Medications  Medication Sig Dispense Refill   acetaminophen (TYLENOL) 500 MG tablet Take 1,000 mg by mouth at bedtime as needed for  mild pain or headache.      albuterol (VENTOLIN HFA) 108 (90 Base) MCG/ACT inhaler Inhale 2 puffs into the lungs every 6 (six) hours as needed for wheezing or shortness of breath. 18 g 5   allopurinol (ZYLOPRIM) 100 MG tablet TAKE 1 AND 1/2 TABLETS BY MOUTH DAILY 135 tablet 3   aspirin EC 81 MG tablet Take 1 tablet (81 mg total) by mouth daily. Swallow whole. 90 tablet 3   carbidopa-levodopa (SINEMET CR) 50-200 MG tablet TAKE 1 TABLET BY MOUTH EVERYDAY AT BEDTIME 90 tablet 0   Carbidopa-Levodopa ER (SINEMET CR) 25-100 MG tablet controlled release TAKE 2 TABLETS BY MOUTH 5 TIMES PER DAY AS DIRECTED 900 tablet 0   Cholecalciferol (VITAMIN D) 50 MCG (2000 UT) tablet Take 2,000 Units by mouth daily.      clotrimazole (LOTRIMIN) 1 % cream Apply 1 Application topically 2 (two) times daily. 30 g 0   colchicine 0.6 MG tablet Take 1 tablet (0.6 mg total) by mouth daily as needed (gout).     Continuous Blood Gluc Receiver (DEXCOM G6 RECEIVER) DEVI Use to check blood sugar daily 1 each 0   Continuous Glucose Sensor (DEXCOM G6 SENSOR) MISC Use to check blood sugar daily 9 each 3   Continuous Glucose Transmitter (DEXCOM G6 TRANSMITTER) MISC  Change every 3 months 1 each 3   dorzolamide-timolol (COSOPT) 22.3-6.8 MG/ML ophthalmic solution Place 1 drop into the right eye 2 (two) times daily.     DULoxetine (CYMBALTA) 30 MG capsule TAKE 1 CAPSULE BY MOUTH EVERY DAY 90 capsule 0   fluticasone-salmeterol (ADVAIR) 250-50 MCG/ACT AEPB Inhale 1 puff into the lungs in the morning and at bedtime. Rinse after use. 180 each 3   FREESTYLE LITE test strip USE TO CHECK BLOOD SUGAR 4 TIMES DAILY. 150 strip 3   furosemide (LASIX) 20 MG tablet Take 20 mg tablet and 1/2 of the 80 mg tablet once daily to equal 60 mg daily 90 tablet 3   furosemide (LASIX) 80 MG tablet TAKE 1 TABLET BY MOUTH EVERY DAY 90 tablet 1   Insulin Disposable Pump (OMNIPOD 5 G6 INTRO, GEN 5,) KIT 1 kit by Does not apply route daily. 1 kit 0   Insulin  Disposable Pump (OMNIPOD 5 G6 PODS, GEN 5,) MISC CHANGE POD EVERY 3 DAYS 30 each 3   insulin lispro (HUMALOG) 100 UNIT/ML injection USE MAXIMUM 76 UNITS PER   DAY WITH V-GO PUMP 60 mL 3   KLOR-CON M20 20 MEQ tablet TAKE 3 TABLETS (60 MEQ) BY MOUTH DAILY. 270 tablet 1   levothyroxine (SYNTHROID) 175 MCG tablet Take 1 tablet (175 mcg total) by mouth daily before breakfast. 90 tablet 1   losartan (COZAAR) 25 MG tablet TAKE 1 TABLET (25 MG TOTAL) BY MOUTH DAILY. PLEASE KEEP SCHEDULED APPOINTMENT 90 tablet 2   metoprolol succinate (TOPROL-XL) 50 MG 24 hr tablet TAKE 1 TABLET BY MOUTH EVERY DAY WITH OR IMMEDIATELY FOLLOWING A MEAL 90 tablet 1   NUPLAZID 34 MG CAPS TAKE 1 CAPSULE BY MOUTH 1 TIME A DAY 90 capsule 0   OVER THE COUNTER MEDICATION Apply 1 application topically daily as needed (pain). Theraworx Pain Cream     Pimavanserin Tartrate (NUPLAZID) 34 MG CAPS Samples of this drug were given to the patient, quantity 5, Lot Number [id number] Exp 05/2022 30 capsule 0   polyethylene glycol (MIRALAX / GLYCOLAX) packet Take 8.5 g by mouth daily.     rosuvastatin (CRESTOR) 20 MG tablet TAKE 1 TABLET BY MOUTH EVERYDAY AT BEDTIME 90 tablet 1   tirzepatide (MOUNJARO) 7.5 MG/0.5ML Pen Inject 7.5 mg into the skin once a week. 6 mL 2   traMADol (ULTRAM) 50 MG tablet Take 1 tablet (50 mg total) by mouth every 12 (twelve) hours as needed. 60 tablet 2   triamcinolone cream (KENALOG) 0.1 % Apply 1 Application topically daily. Apply to rash 15 g 0   vitamin B-12 (CYANOCOBALAMIN) 1000 MCG tablet Take 1,000 mcg by mouth daily.     icosapent Ethyl (VASCEPA) 1 g capsule Take 2 capsules (2 g total) by mouth 2 (two) times daily. 120 capsule 1   nitroGLYCERIN (NITROSTAT) 0.4 MG SL tablet Place 1 tablet (0.4 mg total) under the tongue every 5 (five) minutes as needed for chest pain. 25 tablet 12   spironolactone (ALDACTONE) 25 MG tablet Take 0.5 tablets (12.5 mg total) by mouth daily. 45 tablet 3   No current  facility-administered medications for this visit.    PHYSICAL EXAM: Vitals:   10/30/22 1427  BP: 130/70  Pulse: 73  Resp: 20  SpO2: 93%  Weight: 263 lb 6.4 oz (119.5 kg)  Height: 5\' 9"  (1.753 m)   Body mass index is 38.9 kg/m.  Wt Readings from Last 3 Encounters:  10/30/22 263 lb 6.4 oz (119.5  kg)  10/10/22 258 lb (117 kg)  09/12/22 258 lb 12.8 oz (117.4 kg)    General: Well developed, well nourished male in no apparent distress.  HEENT: AT/[place], no external lesions.  Eyes: Conjunctiva clear and no icterus. Neck: Neck supple  Lungs: Respirations not labored Neurologic: Alert, oriented, normal speech Extremities / Skin: Dry. No sores or rashes noted. Pump site okay.  Psychiatric: Does not appear depressed or anxious  Diabetic Foot Exam - Simple   No data filed    LABS Reviewed Lab Results  Component Value Date   HGBA1C 6.5 (A) 10/30/2022   HGBA1C 6.2 (A) 07/31/2022   HGBA1C 6.6 (A) 03/29/2022   Lab Results  Component Value Date   FRUCTOSAMINE 359 (H) 07/01/2019   FRUCTOSAMINE 277 11/09/2017   FRUCTOSAMINE 350 (H) 03/29/2017   Lab Results  Component Value Date   CHOL 105 07/28/2022   HDL 31 (L) 07/28/2022   LDLCALC 53 07/28/2022   LDLDIRECT 51.0 04/24/2019   TRIG 129 07/28/2022   CHOLHDL 3.4 07/28/2022   Lab Results  Component Value Date   MICRALBCREAT 2.2 02/04/2021   MICRALBCREAT 1.2 12/19/2019   Lab Results  Component Value Date   CREATININE 1.20 08/16/2022   Lab Results  Component Value Date   GFR 56.25 (L) 11/25/2021    ASSESSMENT / PLAN  1. Controlled type 2 diabetes mellitus with complication, with long-term current use of insulin (HCC)   2. Hypothyroidism following radioiodine therapy   3. Diabetic peripheral neuropathy associated with type 2 diabetes mellitus (HCC)   4. Type 2 diabetes mellitus with stage 3a chronic kidney disease, with long-term current use of insulin (HCC)     Diabetes Mellitus type 2, complicated by diabetic  neuropathy/CKD//CAD - Diabetic status / severity: Controlled.  Lab Results  Component Value Date   HGBA1C 6.5 (A) 10/30/2022    - Hemoglobin A1c goal <7%   - Medications: No change in the pump setting.  Patient is advised to use automated mode.  At this time patient is only using manual mode 100%. -Continue OmniPod 5 with Dexcom G6. -Continue Mounjaro 7.5 mg weekly.  - Home glucose testing: continue CGM /Dexcom G6 and check blood glucose as needed.  - Discussed/ Gave Hypoglycemia treatment plan.  # Consult : not required at this time.   # Annual urine for microalbuminuria/ creatinine ratio, no microalbuminuria currently, continue ACE/ARB /losartan.  Following with nephrology.  Patient reports recently had urine microalbumin test with nephrology.  No records available to review. Last  Lab Results  Component Value Date   MICRALBCREAT 2.2 02/04/2021    # Foot check nightly / neuropathy, continue gabapentin per PCP.  # Annual dilated diabetic eye exams.   - Diet: Make healthy diabetic food choices.  2. Blood pressure  -  BP Readings from Last 1 Encounters:  10/30/22 130/70    - Control is in target.  - No change in current plans.  3. Lipid status / Hyperlipidemia - Last  Lab Results  Component Value Date   LDLCALC 53 07/28/2022   - Continue rosuvastatin 20 mg daily, continue Vascepa.  # Postablative hypothyroidism. -Patient is currently taking levothyroxine 137 mcg daily.  Per office note by Dr. Lucianne Muss last in July 2024.  There is plan to increase levothyroxine to 175 mcg daily. -Will check thyroid function test and adjust the dose of levothyroxine as needed.  Diagnoses and all orders for this visit:  Controlled type 2 diabetes mellitus with complication, with long-term current  use of insulin (HCC) -     POCT glycosylated hemoglobin (Hb A1C)  Hypothyroidism following radioiodine therapy -     T4, free; Future -     TSH; Future -     TSH -     T4,  free  Diabetic peripheral neuropathy associated with type 2 diabetes mellitus (HCC)  Type 2 diabetes mellitus with stage 3a chronic kidney disease, with long-term current use of insulin (HCC)  Other orders -     icosapent Ethyl (VASCEPA) 1 g capsule; Take 2 capsules (2 g total) by mouth 2 (two) times daily.    DISPOSITION Follow up in clinic in 3  months suggested.   All questions answered and patient verbalized understanding of the plan.  [place] Keilen Kahl, MD [facility] Endocrinology [facility] [address] [place], [address] [place], [place] [address] Phone # [phone]  At least part of this note was generated using voice recognition software. Inadvertent word errors may have occurred, which were not recognized during the proofreading process.

## 2022-10-31 ENCOUNTER — Encounter: Payer: Self-pay | Admitting: Endocrinology

## 2022-10-31 LAB — TSH: TSH: 6.36 u[IU]/mL — ABNORMAL HIGH (ref 0.35–5.50)

## 2022-10-31 LAB — T4, FREE: Free T4: 0.81 ng/dL (ref 0.60–1.60)

## 2022-11-01 ENCOUNTER — Ambulatory Visit (INDEPENDENT_AMBULATORY_CARE_PROVIDER_SITE_OTHER): Payer: Medicare Other

## 2022-11-01 DIAGNOSIS — Z23 Encounter for immunization: Secondary | ICD-10-CM

## 2022-11-02 ENCOUNTER — Telehealth: Payer: Self-pay

## 2022-11-02 ENCOUNTER — Other Ambulatory Visit: Payer: Self-pay

## 2022-11-02 MED ORDER — LEVOTHYROXINE SODIUM 150 MCG PO TABS: 150.0000 ug | ORAL_TABLET | Freq: Every day | ORAL | 3 refills | Status: DC

## 2022-11-02 NOTE — Telephone Encounter (Signed)
-----   Message from [place] Thapa sent at 11/02/2022  1:31 PM EDT ----- Please notify patient of Labs reviewed TSH is elevated.  He is currently taking levothyroxine 137 mcg daily.  I would like to increase to levothyroxine 150 mcg daily.  Check TSH, free T4 1 week prior to follow-up visit with me in January.  I have placed orders.  Please make arrangement to have labs 1 week prior to follow-up visit with me in January.

## 2022-11-02 NOTE — Telephone Encounter (Signed)
TSH results and med increase given to patient as directed by MD. No further questions at this time.

## 2022-11-02 NOTE — Addendum Note (Signed)
Addended by: Mattisyn Cardona, [place] on: 11/02/2022 01:30 PM   Modules accepted: Orders

## 2022-11-03 NOTE — Progress Notes (Signed)
Remote ICD transmission.   

## 2022-11-08 NOTE — Progress Notes (Signed)
Assessment/Plan:   1.  Parkinsons Disease             -Status post bilateral DBS STN surgery             -Battery changes occurring September 05, 2012; June 10, 2015; November 13, 2017; 09/25/19.  Battery is close to EOS.  I will arrange for change with Dr. Jake Samples.  Spoke with Dr. Jake Samples about it today             -Take carbidopa/levodopa 25/100 CR, currently taking 2 tablets up to 5 times per day  -We discussed Vyalev, which is foscarbidopa/foslevodopa pump that is newly FDA approved.  We discussed that it is for motor fluctuations in adults with advanced Parkinson's disease.  We discussed that this likely will not be on Medicare formulary until the latter half of 2025.  We discussed risks and benefits of this drug.  2.  RBD             -He has tried clonazepam several times and ultimately ends up stopping it because of side effect.  Could try Rozerem in the future if insurance would pay.  Decided to hold off for now as the patient states that while dreaming is vivid, it is not particularly bothersome and he is not falling out of the bed.  Discussed bedroom safety.  He is actually doing much better now but on Nuplazid. 3.  OSAS, noncompliant with CPAP.             -Patient aware of risks/morbidity and mortality associated with untreated sleep apnea 4.  CHF             -ICD placed on 11/15/2013  -DBS will need placed into special mode if pt ever requires cardioversion 5.  Facial paresthesias             -Has been going on for few years and became bilateral, not c/w TN.  He has had several negative noncontrast CTs of the brain.  Kidney function is not good enough for contrasted CT.  His DBS device is not MRI compatible.             -I am unsure what this is, and offered to try carbamazepine, but he really did not want that.             -I have turned off his DBS and he noticed no change in the facial paresthesias. 6.  Dysphagia  -Patient had updated modified barium swallow on March 30, 2021.   Patient with mild oral phase dysphagia and mild to moderate pharyngeal phase dysphagia and also the appearance of esophageal phase dysphagia.  The speech therapist recommended for his regular diet, but if he has more coughing or other dysphagia symptoms, to consider nectar thick liquids.  -It should be noted that Parkinson's disease does not cause esophageal phase dysphagia and this may need to be evaluated by gastroenterology.  It could certainly be responsible for the other phases of dysphagia.  7.  Myoclonus  -off of gabapentin now and doing well in that regard  -on cymbalta 30 mg for PN 8.  Hallucinations  -Markedly improved on Nuplazid, 34 mg daily. 9.  Paroxysmal A-fib  -Not on anticoagulation because of his history of falls. 10.  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 Wynn Banker.  Didn't help but he has a f/u there next week  Subjective:   Henry [name] was seen today in follow up for  Parkinsons disease.  My previous records were reviewed prior to todays visit as well as outside records available to me.  Patient with wife who supplements history.  He was seen by Dr. Berenda Morale since our last visit for dystonia of the foot and received Xeomin.  Injections were done on October 1.  He reports that he isn't sure if it helped or not.  He has an appt next tues.  He has had a few falls since our last visit.  Was trying to make it to the get it to the bathroom and didn't make it and was trying to get to the toilet and missed it and fell.  His head ended up hitting the shower track and hit his arm.  This was several weeks ago.  He also had a fall in the closet and fell (changing walkers).    Dreams are vivid and when wakes up has some trouble differentiating dream from reality but no hallucinations in day  Current prescribed movement disorder medications: Carbidopa/levodopa 25/100 CR, 2 tablets at 8 AM/noon/4pm/8pm/midnight (this is how he is dosing this now) Carbidopa/levodopa 50/200 CR at  bedtime (he's not taking that) Cymbalta, 30 mg daily Nuplazid, 34 mg daily   Prior medications: Gabapentin (myoclonus);    ALLERGIES:   Allergies  Allergen Reactions   Penicillins Anaphylaxis and Rash    Because of a history of documented adverse serious drug reaction;Medi Alert bracelet  is recommended PATIENT HAS HAD A PCN REACTION WITH IMMEDIATE RASH, FACIAL/TONGUE/THROAT SWELLING, SOB, OR LIGHTHEADEDNESS WITH HYPOTENSION:  #  #  YES  #  #  Has patient had a PCN reaction causing severe rash involving mucus membranes or skin necrosis: unknown Has patient had a PCN reaction that required hospitalization NO Has patient had a PCN reaction occurring within the last 10 years: NO   Klonopin [Clonazepam] Other (See Comments)    agitation   Peanut-Containing Drug Products Cough   Watermelon [Citrullus Vulgaris] Other (See Comments) and Cough    Tickle in throat    CURRENT MEDICATIONS:  Outpatient Encounter Medications as of 11/13/2022  Medication Sig   acetaminophen (TYLENOL) 500 MG tablet Take 1,000 mg by mouth at bedtime as needed for mild pain or headache.    albuterol (VENTOLIN HFA) 108 (90 Base) MCG/ACT inhaler Inhale 2 puffs into the lungs every 6 (six) hours as needed for wheezing or shortness of breath.   allopurinol (ZYLOPRIM) 100 MG tablet TAKE 1 AND 1/2 TABLETS BY MOUTH DAILY   aspirin EC 81 MG tablet Take 1 tablet (81 mg total) by mouth daily. Swallow whole.   Carbidopa-Levodopa ER (SINEMET CR) 25-100 MG tablet controlled release TAKE 2 TABLETS BY MOUTH 5 TIMES PER DAY AS DIRECTED   Cholecalciferol (VITAMIN D) 50 MCG (2000 UT) tablet Take 2,000 Units by mouth daily.    clotrimazole (LOTRIMIN) 1 % cream Apply 1 Application topically 2 (two) times daily.   colchicine 0.6 MG tablet Take 1 tablet (0.6 mg total) by mouth daily as needed (gout).   Continuous Blood Gluc Receiver (DEXCOM G6 RECEIVER) DEVI Use to check blood sugar daily   Continuous Glucose Sensor (DEXCOM G6 SENSOR)  MISC Use to check blood sugar daily   Continuous Glucose Transmitter (DEXCOM G6 TRANSMITTER) MISC Change every 3 months   dorzolamide-timolol (COSOPT) 22.3-6.8 MG/ML ophthalmic solution Place 1 drop into the right eye 2 (two) times daily.   DULoxetine (CYMBALTA) 30 MG capsule TAKE 1 CAPSULE BY MOUTH EVERY DAY   fluticasone-salmeterol (ADVAIR) 250-50 MCG/ACT  AEPB Inhale 1 puff into the lungs in the morning and at bedtime. Rinse after use.   FREESTYLE LITE test strip USE TO CHECK BLOOD SUGAR 4 TIMES DAILY.   furosemide (LASIX) 20 MG tablet Take 20 mg tablet and 1/2 of the 80 mg tablet once daily to equal 60 mg daily   furosemide (LASIX) 80 MG tablet TAKE 1 TABLET BY MOUTH EVERY DAY   icosapent Ethyl (VASCEPA) 1 g capsule Take 2 capsules (2 g total) by mouth 2 (two) times daily.   Insulin Disposable Pump (OMNIPOD 5 G6 INTRO, GEN 5,) KIT 1 kit by Does not apply route daily.   Insulin Disposable Pump (OMNIPOD 5 G6 PODS, GEN 5,) MISC CHANGE POD EVERY 3 DAYS   insulin lispro (HUMALOG) 100 UNIT/ML injection USE MAXIMUM 76 UNITS PER   DAY WITH V-GO PUMP   KLOR-CON M20 20 MEQ tablet TAKE 3 TABLETS (60 MEQ) BY MOUTH DAILY.   levothyroxine (SYNTHROID) 150 MCG tablet Take 1 tablet (150 mcg total) by mouth daily before breakfast.   losartan (COZAAR) 25 MG tablet TAKE 1 TABLET (25 MG TOTAL) BY MOUTH DAILY. PLEASE KEEP SCHEDULED APPOINTMENT   metoprolol succinate (TOPROL-XL) 50 MG 24 hr tablet TAKE 1 TABLET BY MOUTH EVERY DAY WITH OR IMMEDIATELY FOLLOWING A MEAL   NUPLAZID 34 MG CAPS TAKE 1 CAPSULE BY MOUTH 1 TIME A DAY   OVER THE COUNTER MEDICATION Apply 1 application topically daily as needed (pain). Theraworx Pain Cream   Pimavanserin Tartrate (NUPLAZID) 34 MG CAPS Samples of this drug were given to the patient, quantity 5, Lot Number [id number] Exp 05/2022   polyethylene glycol (MIRALAX / GLYCOLAX) packet Take 8.5 g by mouth daily.   rosuvastatin (CRESTOR) 20 MG tablet TAKE 1 TABLET BY MOUTH EVERYDAY AT BEDTIME    tirzepatide (MOUNJARO) 7.5 MG/0.5ML Pen Inject 7.5 mg into the skin once a week.   traMADol (ULTRAM) 50 MG tablet Take 1 tablet (50 mg total) by mouth every 12 (twelve) hours as needed.   triamcinolone cream (KENALOG) 0.1 % Apply 1 Application topically daily. Apply to rash   vitamin B-12 (CYANOCOBALAMIN) 1000 MCG tablet Take 1,000 mcg by mouth daily.   [DISCONTINUED] carbidopa-levodopa (SINEMET CR) 50-200 MG tablet TAKE 1 TABLET BY MOUTH EVERYDAY AT BEDTIME   nitroGLYCERIN (NITROSTAT) 0.4 MG SL tablet Place 1 tablet (0.4 mg total) under the tongue every 5 (five) minutes as needed for chest pain.   spironolactone (ALDACTONE) 25 MG tablet Take 0.5 tablets (12.5 mg total) by mouth daily.   No facility-administered encounter medications on file as of 11/13/2022.    Objective:   PHYSICAL EXAMINATION:    VITALS:   Vitals:   11/13/22 1252  BP: 126/70  Pulse: 61  SpO2: 99%  Weight: 263 lb 6.4 oz (119.5 kg)     GEN:  The patient appears stated age and is in NAD. HEENT:  Normocephalic, atraumatic.  The mucous membranes are moist. The superficial temporal arteries are without ropiness or tenderness. Cardiovascular: Regular rate rhythm Lungs: Clear to auscultation bilaterally  Neurological examination:  Orientation: The patient is alert and oriented to person, place, and time.  He asks appropriate questions and is very interactive today. Cranial nerves: There is good facial symmetry with facial hypomimia. The speech is fluent and dysarthric (baseline) he is hypophonic.Marland Kitchen Soft palate rises symmetrically and there is no tongue deviation. Hearing is intact to conversational tone. Sensation: Sensation is intact to light touch throughout Motor: Strength is at least antigravity x4.  Movement examination: Tone: There is normal tone in the upper and lower extremities Abnormal movements: No rest tremor today. Coordination:  There is slowness with foot taps bilaterally, L>R Gait and Station:  Patient had his U step in the office today.  He was flexed at the waist, but actually ambulated quite well today and did fairly well in the turns.  The right foot and ankle does evert with ambulation.  This is same as previous  I have reviewed and interpreted the following labs independently    Chemistry      Component Value Date/Time   NA 141 08/16/2022 1115   K 5.1 08/16/2022 1115   CL 98 08/16/2022 1115   CO2 28 08/16/2022 1115   BUN 20 08/16/2022 1115   CREATININE 1.20 08/16/2022 1115   CREATININE 1.37 (H) 07/28/2022 0826      Component Value Date/Time   CALCIUM 9.2 08/16/2022 1115   ALKPHOS 72 05/31/2021 1055   AST 5 (L) 07/28/2022 0826   ALT <3 (L) 07/28/2022 0826   BILITOT 0.5 07/28/2022 0826       Lab Results  Component Value Date   WBC 12.2 (H) 05/24/2020   HGB 14.8 05/24/2020   HCT 44.0 05/24/2020   MCV 84.2 05/24/2020   PLT 121.0 (L) 05/24/2020    Lab Results  Component Value Date   TSH 6.36 (H) 10/30/2022     Total time spent on today's visit was 43  minutes, including both face-to-face time and nonface-to-face time.  Time included that spent on review of records (prior notes available to me/labs/imaging if pertinent), discussing treatment and goals, answering patient's questions and coordinating care.  This was independent of his DBS time, on a separate note  Cc:  Joaquim Nam, MD

## 2022-11-13 ENCOUNTER — Ambulatory Visit (INDEPENDENT_AMBULATORY_CARE_PROVIDER_SITE_OTHER): Payer: Medicare Other | Admitting: Neurology

## 2022-11-13 ENCOUNTER — Encounter: Payer: Self-pay | Admitting: Neurology

## 2022-11-13 VITALS — BP 126/70 | HR 61 | Wt 263.4 lb

## 2022-11-13 DIAGNOSIS — G249 Dystonia, unspecified: Secondary | ICD-10-CM

## 2022-11-13 DIAGNOSIS — G4752 REM sleep behavior disorder: Secondary | ICD-10-CM

## 2022-11-13 DIAGNOSIS — R441 Visual hallucinations: Secondary | ICD-10-CM | POA: Diagnosis not present

## 2022-11-13 DIAGNOSIS — G20A1 Parkinson's disease without dyskinesia, without mention of fluctuations: Secondary | ICD-10-CM | POA: Diagnosis not present

## 2022-11-13 NOTE — Procedures (Signed)
DBS Programming was performed.    Manufacturer of DBS device: [employer]  Total time spent programming was 8 minutes.  Device was confirmed to be on.  Soft start was confirmed to be on.  Impedences were checked and were within normal limits.  Battery was checked and was 13%.    Final settings were as follows with Group B active:   Active Contact Amplitude  PW (ms) Frequency (hz) Side Effects Battery  Left Brain        09/08/19 1-2+ 4.4V 90 170  2.59  10/08/19             Group A 1-2+ 4.29mA 90 170         Group B 0-1-2-C+ 1.1 80 125     0- (1.1)1-(1.0)2-(0.6)       01/19/20             Group B 0-1-2-C+ 1.1 80 125  4 yrs 9 months  05/27/20 Group B 0-1-2-C+ 1.3 80 125  3 years, 1 month  11/30/20 0-1-2-C+ 1.3 80 125  3 years, 5 months  05/31/21 0-1-2-C+ 1.3 80 125 Higher amp with face pull 1 yr, 10 months   12/05/21 0-1-2-C+ 1.4 80 125  1 yr, 3 months  06/06/22 0-1-2-C+ 1.4 80 125    11/13/22 0-1-2-C+ 1.4 80 125  3 months          Right Brain        09/08/19 4-7+ 4.5V 90 170    10/08/19             Group A 4-7+ 2.56mA 90 170         Group B 4-7-C+ 2.5 60 125     4-(2.5)7-(0.6)C+       01/19/20             Group B 4-7-C+ 2.8 60 165    05/27/20 group B 4-7-C+ 2.8 60 165    11/30/20 4-7-C+ 2.8 60 165    05/31/21 4-7-C+ 2.9 60 165    12/05/21 4-7-C+ 2.9 60 165    06/06/22 4-7-C+ 2.9 60 165    11/13/22 4-7-C+ 2.9 60 165

## 2022-11-20 ENCOUNTER — Encounter: Payer: Self-pay | Admitting: Endocrinology

## 2022-11-21 ENCOUNTER — Encounter: Payer: Medicare Other | Attending: Physical Medicine & Rehabilitation | Admitting: Physical Medicine & Rehabilitation

## 2022-11-21 ENCOUNTER — Encounter: Payer: Self-pay | Admitting: Physical Medicine & Rehabilitation

## 2022-11-21 VITALS — BP 118/81 | HR 68 | Ht 69.0 in | Wt 260.8 lb

## 2022-11-21 DIAGNOSIS — G248 Other dystonia: Secondary | ICD-10-CM | POA: Insufficient documentation

## 2022-11-21 NOTE — Progress Notes (Signed)
Subjective:    Patient ID: Henry [name], male    DOB: Nov 18, 1950, 72 y.o.   MRN: [id number]  HPI 72 year old male with Parkinson's disease and focal dystonia who returns to discuss his focal dystonia.  He is here with his spouse.  The patient is not sure whether the botulinum toxin was helpful although his wife feels like his foot and ankle are not turning so much as they were before. He continues to ambulate with a Parkinson's walker.  Squeeze the handles to ambulate and let go to brake  He had no swelling or bruising after the injection.  He did note some pain during the injection when the medial aspect of his leg was injected for the tibialis posterior access Right tibialis posterior 75 units Right peroneus longus 50 units Right gluteus medius 75 units Pain Inventory Average Pain 0 Pain Right Now 0  In the last 24 hours, has pain interfered with the following? General activity 0 Relation with others 0 Enjoyment of life 0  Sleep (in general) Fair   Family History  Problem Relation Age of Onset   Aneurysm Mother    Alcoholism Father    HIV/AIDS Brother 88       AIDS   Healthy Sister    Healthy Child    Peripheral vascular disease Other    Arthritis Other    Healthy Sister    Healthy Child    Healthy Child    Diabetes Neg Hx    Heart disease Neg Hx    Colon cancer Neg Hx    Prostate cancer Neg Hx    Social History   Socioeconomic History   Marital status: Married    Spouse name: [name]   Number of children: 2   Years of education: 12   Highest education level: High school graduate  Occupational History   Occupation: DISABLED    Comment: [employer]  Tobacco Use   Smoking status: Never    Passive exposure: Never   Smokeless tobacco: Never  Vaping Use   Vaping status: Never Used  Substance and Sexual Activity   Alcohol use: Not Currently   Drug use: No   Sexual activity: Not Currently  Other Topics Concern   Not on file  Social History  Narrative   From [place]   Retired/disability Archivist   Likes to fish.     Married 1972   3 kids   [place] fan   Social Determinants of Health   Financial Resource Strain: Low Risk  (12/23/2020)   Overall Financial Resource Strain (CARDIA)    Difficulty of Paying Living Expenses: Not hard at all  Food Insecurity: No Food Insecurity (12/28/2021)   Hunger Vital Sign    Worried About Running Out of Food in the Last Year: Never true    Ran Out of Food in the Last Year: Never true  Transportation Needs: No Transportation Needs (12/28/2021)   PRAPARE - Administrator, Civil Service (Medical): No    Lack of Transportation (Non-Medical): No  Physical Activity: Inactive (12/28/2021)   Exercise Vital Sign    Days of Exercise per Week: 0 days    Minutes of Exercise per Session: 0 min  Stress: No Stress Concern Present (12/28/2021)   [employer] of Occupational Health - Occupational Stress Questionnaire    Feeling of Stress : Not at all  Social Connections: Socially Integrated (12/28/2021)   Social Connection and Isolation Panel [NHANES]    Frequency  of Communication with Friends and Family: More than three times a week    Frequency of Social Gatherings with Friends and Family: More than three times a week    Attends Religious Services: More than 4 times per year    Active Member of Clubs or Organizations: Yes    Attends Engineer, structural: More than 4 times per year    Marital Status: Married   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   BIOPSY  10/14/2018   Procedure: BIOPSY;  Surgeon: Beverley Fiedler, MD;  Location: [facility];  Service: Gastroenterology;;   CARDIAC CATHETERIZATION     CATARACT EXTRACTION W/ INTRAOCULAR LENS IMPLANT     Hx: of right eye   CATARACT EXTRACTION W/ INTRAOCULAR LENS IMPLANT Left 2018   COLONOSCOPY N/A 10/13/2014   Procedure: COLONOSCOPY;   Surgeon: Beverley Fiedler, MD;  Location: [facility];  Service: Gastroenterology;  Laterality: N/A;   COLONOSCOPY W/ BIOPSIES AND POLYPECTOMY     Hx: of   COLONOSCOPY WITH PROPOFOL N/A 10/14/2018   Procedure: COLONOSCOPY WITH PROPOFOL;  Surgeon: Beverley Fiedler, MD;  Location: [facility];  Service: Gastroenterology;  Laterality: N/A;   CORONARY ARTERY BYPASS GRAFT  2000   Tressie Stalker, MD   CORONARY STENT PLACEMENT  1998   DEEP BRAIN STIMULATOR PLACEMENT  2004   Right and left VIN stimulator placement (parkinsons)   EYE SURGERY     FINGER AMPUTATION     left pointer   IMPLANTABLE CARDIOVERTER DEFIBRILLATOR IMPLANT N/A 11/13/2013   Procedure: IMPLANTABLE CARDIOVERTER DEFIBRILLATOR IMPLANT;  Surgeon: Marinus Maw, MD;  Location: [facility] CATH LAB;  Service: Cardiovascular;  Laterality: N/A;   INSERT / REPLACE / REMOVE PACEMAKER     medtronic   LEFT AND RIGHT HEART CATHETERIZATION WITH CORONARY ANGIOGRAM N/A 09/24/2013   Procedure: LEFT AND RIGHT HEART CATHETERIZATION WITH CORONARY ANGIOGRAM;  Surgeon: Kathleene Hazel, MD;  Location: [facility] CATH LAB;  Service: Cardiovascular;  Laterality: N/A;   LITHOTRIPSY     3 different times   MEDIAN STERNOTOMY  2000   POLYPECTOMY  10/14/2018   Procedure: POLYPECTOMY;  Surgeon: Beverley Fiedler, MD;  Location: [facility];  Service: Gastroenterology;;   PULSE GENERATOR IMPLANT Right 11/13/2017   Procedure: Right chest implantable pulse generator change;  Surgeon: Maeola Harman, MD;  Location: [facility] OR;  Service: Neurosurgery;  Laterality: Right;  Right chest implantable pulse generator change   SUBTHALAMIC STIMULATOR BATTERY REPLACEMENT N/A 09/05/2012   Procedure: Deep brain stimulator battery change;  Surgeon: Maeola Harman, MD;  Location: [facility];  Service: Neurosurgery;  Laterality: N/A;  Deep brain stimulator battery change   SUBTHALAMIC STIMULATOR BATTERY REPLACEMENT N/A 06/10/2015   Procedure: Deep Brain stimulator battery change;  Surgeon: Maeola Harman, MD;   Location: [facility];  Service: Neurosurgery;  Laterality: N/A;   SUBTHALAMIC STIMULATOR BATTERY REPLACEMENT Right 09/25/2019   Procedure: Deep brain stimulator battery change;  Surgeon: Maeola Harman, MD;  Location: [facility];  Service: Neurosurgery;  Laterality: Right;   TONSILLECTOMY     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   BIOPSY  10/14/2018   Procedure: BIOPSY;  Surgeon: Beverley Fiedler, MD;  Location: [facility];  Service: Gastroenterology;;   CARDIAC CATHETERIZATION     CATARACT EXTRACTION W/ INTRAOCULAR LENS IMPLANT     Hx: of right eye  CATARACT EXTRACTION W/ INTRAOCULAR LENS IMPLANT Left 2018   COLONOSCOPY N/A 10/13/2014   Procedure: COLONOSCOPY;  Surgeon: Beverley Fiedler, MD;  Location: [facility];  Service: Gastroenterology;  Laterality: N/A;   COLONOSCOPY W/ BIOPSIES AND POLYPECTOMY     Hx: of   COLONOSCOPY WITH PROPOFOL N/A 10/14/2018   Procedure: COLONOSCOPY WITH PROPOFOL;  Surgeon: Beverley Fiedler, MD;  Location: [facility];  Service: Gastroenterology;  Laterality: N/A;   CORONARY ARTERY BYPASS GRAFT  2000   Tressie Stalker, MD   CORONARY STENT PLACEMENT  1998   DEEP BRAIN STIMULATOR PLACEMENT  2004   Right and left VIN stimulator placement (parkinsons)   EYE SURGERY     FINGER AMPUTATION     left pointer   IMPLANTABLE CARDIOVERTER DEFIBRILLATOR IMPLANT N/A 11/13/2013   Procedure: IMPLANTABLE CARDIOVERTER DEFIBRILLATOR IMPLANT;  Surgeon: Marinus Maw, MD;  Location: [facility] CATH LAB;  Service: Cardiovascular;  Laterality: N/A;   INSERT / REPLACE / REMOVE PACEMAKER     medtronic   LEFT AND RIGHT HEART CATHETERIZATION WITH CORONARY ANGIOGRAM N/A 09/24/2013   Procedure: LEFT AND RIGHT HEART CATHETERIZATION WITH CORONARY ANGIOGRAM;  Surgeon: Kathleene Hazel, MD;  Location: [facility] CATH LAB;  Service: Cardiovascular;  Laterality: N/A;   LITHOTRIPSY     3 different times   MEDIAN STERNOTOMY  2000   POLYPECTOMY   10/14/2018   Procedure: POLYPECTOMY;  Surgeon: Beverley Fiedler, MD;  Location: [facility];  Service: Gastroenterology;;   PULSE GENERATOR IMPLANT Right 11/13/2017   Procedure: Right chest implantable pulse generator change;  Surgeon: Maeola Harman, MD;  Location: [facility] OR;  Service: Neurosurgery;  Laterality: Right;  Right chest implantable pulse generator change   SUBTHALAMIC STIMULATOR BATTERY REPLACEMENT N/A 09/05/2012   Procedure: Deep brain stimulator battery change;  Surgeon: Maeola Harman, MD;  Location: [facility];  Service: Neurosurgery;  Laterality: N/A;  Deep brain stimulator battery change   SUBTHALAMIC STIMULATOR BATTERY REPLACEMENT N/A 06/10/2015   Procedure: Deep Brain stimulator battery change;  Surgeon: Maeola Harman, MD;  Location: [facility];  Service: Neurosurgery;  Laterality: N/A;   SUBTHALAMIC STIMULATOR BATTERY REPLACEMENT Right 09/25/2019   Procedure: Deep brain stimulator battery change;  Surgeon: Maeola Harman, MD;  Location: [facility] OR;  Service: Neurosurgery;  Laterality: Right;   TONSILLECTOMY     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   Dysrhythmia    a-fib   GERD (gastroesophageal reflux disease)    Glaucoma    right eye   History of kidney stones    multiple   HLD (hyperlipidemia)    HTN (hypertension)    pt denies 08/19/12   Hyperplasia, prostate    Hypothyroidism    MI (myocardial infarction) (HCC)    Dr Jens Som 2000, x3vessels bypass   OSA (obstructive sleep apnea)    AHI-28,on CPAP, noncompliant with CPAP   Parkinson disease (HCC)    1999   PONV  (postoperative nausea and vomiting)  Restless legs    Shortness of breath    Hx: of at all times   UTI (lower urinary tract infection) 09/15/12   Klebsiella   Ventral hernia    Walker as ambulation aid    also uses wheelchair at home/when going out   BP 118/81   Pulse 68   Ht 5\' 9"  (1.753 m)   Wt 260 lb 12.8 oz (118.3 kg)   SpO2 96%   BMI 38.51 kg/m   Opioid Risk Score:   Fall Risk Score:  `1  Depression screen [facility] 2/9     11/21/2022   12:45 PM 10/10/2022   11:26 AM 09/12/2022    2:58 PM 03/03/2022   12:30 PM 12/28/2021   12:43 PM 12/23/2020   11:25 AM 08/16/2018   12:16 PM  Depression screen PHQ 2/9  Decreased Interest 0 0 0 0 0 0 0  Down, Depressed, Hopeless 0 0 1 0 0 0 0  PHQ - 2 Score 0 0 1 0 0 0 0  Altered sleeping   1 0   3  Tired, decreased energy   1 0   0  Change in appetite   0 0   0  Feeling bad or failure about yourself    0 0   0  Trouble concentrating   1 0   0  Moving slowly or fidgety/restless   3 0   0  Suicidal thoughts   0 0   0  PHQ-9 Score   7 0   3  Difficult doing work/chores   Somewhat difficult Not difficult at all   Not difficult at all      Review of Systems  Constitutional: Negative.   HENT: Negative.    Eyes: Negative.   Respiratory: Negative.    Cardiovascular: Negative.   Gastrointestinal: Negative.   Endocrine: Negative.   Genitourinary: Negative.   Musculoskeletal:  Positive for gait problem.  Skin: Negative.   Allergic/Immunologic: Negative.   Hematological: Negative.   Psychiatric/Behavioral: Negative.    All other systems reviewed and are negative.      Objective:   Physical Exam No evidence of clonus at the ankle he is able to invert and evert his foot. Lower extremity strength is normal. He ambulates with a walker he does have external rotation at the hip.  No abnormal movements at the ankle. There is no evidence of tremor no evidence of clonus at the ankle.       Assessment & Plan:  1.  Focal dystonia is  external rotation is coming primarily from his hip.  In addition to injecting the right gluteus medius will inject the right piriformis.  This will need to be done under ultrasound guidance. Right tibialis posterior 75 units Right peroneus longus 75 units Right gluteus medius 75 units Right Piriformis 75 Discussed dosing as well as muscle group selection with patient and wife indicating that it may take several dosing cycles to arrive at the optimal treatment dose and muscle group selection

## 2022-11-21 NOTE — Patient Instructions (Signed)
Taylor stretch , Figure 4 stretch 30s twice a day

## 2022-11-22 ENCOUNTER — Other Ambulatory Visit: Payer: Self-pay

## 2022-11-22 ENCOUNTER — Encounter: Payer: Self-pay | Admitting: Endocrinology

## 2022-11-22 DIAGNOSIS — E782 Mixed hyperlipidemia: Secondary | ICD-10-CM

## 2022-11-22 DIAGNOSIS — E1165 Type 2 diabetes mellitus with hyperglycemia: Secondary | ICD-10-CM

## 2022-11-22 MED ORDER — ROSUVASTATIN CALCIUM 20 MG PO TABS: ORAL_TABLET | ORAL | 1 refills | Status: DC

## 2022-11-22 MED ORDER — INSULIN LISPRO 100 UNIT/ML IJ SOLN: INTRAMUSCULAR | 3 refills | Status: DC

## 2022-11-27 DIAGNOSIS — Z462 Encounter for fitting and adjustment of other devices related to nervous system and special senses: Secondary | ICD-10-CM | POA: Diagnosis not present

## 2022-11-27 DIAGNOSIS — Z6839 Body mass index (BMI) 39.0-39.9, adult: Secondary | ICD-10-CM | POA: Diagnosis not present

## 2022-11-28 ENCOUNTER — Other Ambulatory Visit: Payer: Self-pay | Admitting: Neurology

## 2022-11-28 ENCOUNTER — Other Ambulatory Visit: Payer: Self-pay | Admitting: Neurological Surgery

## 2022-11-28 DIAGNOSIS — G253 Myoclonus: Secondary | ICD-10-CM

## 2022-11-28 DIAGNOSIS — G20A1 Parkinson's disease without dyskinesia, without mention of fluctuations: Secondary | ICD-10-CM

## 2022-11-28 DIAGNOSIS — G20A2 Parkinson's disease without dyskinesia, with fluctuations: Secondary | ICD-10-CM

## 2022-12-05 ENCOUNTER — Ambulatory Visit: Payer: Medicare Other | Attending: Internal Medicine | Admitting: Internal Medicine

## 2022-12-05 ENCOUNTER — Encounter: Payer: Self-pay | Admitting: Internal Medicine

## 2022-12-05 VITALS — BP 136/68 | HR 71 | Ht 69.0 in | Wt 259.0 lb

## 2022-12-05 DIAGNOSIS — I5022 Chronic systolic (congestive) heart failure: Secondary | ICD-10-CM

## 2022-12-05 DIAGNOSIS — I480 Paroxysmal atrial fibrillation: Secondary | ICD-10-CM

## 2022-12-05 LAB — CUP PACEART INCLINIC DEVICE CHECK
Battery Remaining Longevity: 20 mo
Battery Voltage: 2.88 V
Brady Statistic AP VP Percent: 0.01 %
Brady Statistic AP VS Percent: 4.95 %
Brady Statistic AS VP Percent: 0.07 %
Brady Statistic AS VS Percent: 94.98 %
Brady Statistic RA Percent Paced: 4.93 %
Brady Statistic RV Percent Paced: 0.07 %
Date Time Interrogation Session: 20241126162459
HighPow Impedance: 72 Ohm
Implantable Lead Connection Status: 753985
Implantable Lead Connection Status: 753985
Implantable Lead Implant Date: 20151105
Implantable Lead Implant Date: 20151105
Implantable Lead Location: 753859
Implantable Lead Location: 753860
Implantable Lead Model: 5076
Implantable Pulse Generator Implant Date: 20151105
Lead Channel Impedance Value: 437 Ohm
Lead Channel Impedance Value: 456 Ohm
Lead Channel Impedance Value: 513 Ohm
Lead Channel Pacing Threshold Amplitude: 0.375 V
Lead Channel Pacing Threshold Amplitude: 1.125 V
Lead Channel Pacing Threshold Pulse Width: 0.4 ms
Lead Channel Pacing Threshold Pulse Width: 0.4 ms
Lead Channel Sensing Intrinsic Amplitude: 1.125 mV
Lead Channel Sensing Intrinsic Amplitude: 1.875 mV
Lead Channel Sensing Intrinsic Amplitude: 17.5 mV
Lead Channel Sensing Intrinsic Amplitude: 19.125 mV
Lead Channel Setting Pacing Amplitude: 2.25 V
Lead Channel Setting Pacing Amplitude: 2.5 V
Lead Channel Setting Pacing Pulse Width: 0.4 ms
Lead Channel Setting Sensing Sensitivity: 0.3 mV
Zone Setting Status: 755011

## 2022-12-05 NOTE — Progress Notes (Signed)
HPI [name] returns today for followup. He is a pleasant 72 yo man with a h/o chronic systolic heart failure, ICM, s/p ICD implantation. In the interim,he has had some progression of his Parkinson's. He admits to being sedentary. He denies chest pain. He has spells of dizziness and falls. He had a spinal stimulator which helped his tremor. He has some dyspnea with exertion and admits to being sedentary. Allergies  Allergen Reactions   Penicillins Anaphylaxis and Rash    Because of a history of documented adverse serious drug reaction;Medi Alert bracelet  is recommended PATIENT HAS HAD A PCN REACTION WITH IMMEDIATE RASH, FACIAL/TONGUE/THROAT SWELLING, SOB, OR LIGHTHEADEDNESS WITH HYPOTENSION:  #  #  YES  #  #  Has patient had a PCN reaction causing severe rash involving mucus membranes or skin necrosis: unknown Has patient had a PCN reaction that required hospitalization NO Has patient had a PCN reaction occurring within the last 10 years: NO   Klonopin [Clonazepam] Other (See Comments)    agitation   Peanut-Containing Drug Products Cough   Watermelon [Citrullus Vulgaris] Other (See Comments) and Cough    Tickle in throat     Current Outpatient Medications  Medication Sig Dispense Refill   acetaminophen (TYLENOL) 500 MG tablet Take 1,000 mg by mouth at bedtime as needed for mild pain or headache.      albuterol (VENTOLIN HFA) 108 (90 Base) MCG/ACT inhaler Inhale 2 puffs into the lungs every 6 (six) hours as needed for wheezing or shortness of breath. 18 g 5   allopurinol (ZYLOPRIM) 100 MG tablet TAKE 1 AND 1/2 TABLETS BY MOUTH DAILY 135 tablet 3   aspirin EC 81 MG tablet Take 1 tablet (81 mg total) by mouth daily. Swallow whole. 90 tablet 3   Carbidopa-Levodopa ER (SINEMET CR) 25-100 MG tablet controlled release TAKE 2 TABLETS BY MOUTH 5 TIMES PER DAY AS DIRECTED 900 tablet 0   Cholecalciferol (VITAMIN D) 50 MCG (2000 UT) tablet Take 2,000 Units by mouth daily.       clotrimazole (LOTRIMIN) 1 % cream Apply 1 Application topically 2 (two) times daily. 30 g 0   colchicine 0.6 MG tablet Take 1 tablet (0.6 mg total) by mouth daily as needed (gout).     Continuous Blood Gluc Receiver (DEXCOM G6 RECEIVER) DEVI Use to check blood sugar daily 1 each 0   Continuous Glucose Sensor (DEXCOM G6 SENSOR) MISC Use to check blood sugar daily 9 each 3   Continuous Glucose Transmitter (DEXCOM G6 TRANSMITTER) MISC Change every 3 months 1 each 3   dorzolamide-timolol (COSOPT) 22.3-6.8 MG/ML ophthalmic solution Place 1 drop into the right eye 2 (two) times daily.     DULoxetine (CYMBALTA) 30 MG capsule TAKE 1 CAPSULE BY MOUTH EVERY DAY 90 capsule 0   fluticasone-salmeterol (ADVAIR) 250-50 MCG/ACT AEPB Inhale 1 puff into the lungs in the morning and at bedtime. Rinse after use. 180 each 3   FREESTYLE LITE test strip USE TO CHECK BLOOD SUGAR 4 TIMES DAILY. 150 strip 3   furosemide (LASIX) 20 MG tablet Take 20 mg tablet and 1/2 of the 80 mg tablet once daily to equal 60 mg daily 90 tablet 3   furosemide (LASIX) 80 MG tablet TAKE 1 TABLET BY MOUTH EVERY DAY (Patient taking differently: Take 40 mg by mouth daily.) 90 tablet 1   icosapent Ethyl (VASCEPA) 1 g capsule Take 2 capsules (2 g total) by mouth 2 (two) times daily. 120 capsule  1   Insulin Disposable Pump (OMNIPOD 5 G6 INTRO, GEN 5,) KIT 1 kit by Does not apply route daily. 1 kit 0   Insulin Disposable Pump (OMNIPOD 5 G6 PODS, GEN 5,) MISC CHANGE POD EVERY 3 DAYS 30 each 3   insulin lispro (HUMALOG) 100 UNIT/ML injection USE MAXIMUM 76 UNITS PER   DAY WITH V-GO PUMP 60 mL 3   KLOR-CON M20 20 MEQ tablet TAKE 3 TABLETS (60 MEQ) BY MOUTH DAILY. (Patient taking differently: Take 20-40 mEq by mouth See admin instructions. Take 40 mEq by mouth in the morning and take 20 mEq in the evening) 270 tablet 1   levothyroxine (SYNTHROID) 150 MCG tablet Take 1 tablet (150 mcg total) by mouth daily before breakfast. 90 tablet 3   losartan (COZAAR)  25 MG tablet TAKE 1 TABLET (25 MG TOTAL) BY MOUTH DAILY. PLEASE KEEP SCHEDULED APPOINTMENT 90 tablet 2   metoprolol succinate (TOPROL-XL) 50 MG 24 hr tablet TAKE 1 TABLET BY MOUTH EVERY DAY WITH OR IMMEDIATELY FOLLOWING A MEAL 90 tablet 1   nitroGLYCERIN (NITROSTAT) 0.4 MG SL tablet Place 1 tablet (0.4 mg total) under the tongue every 5 (five) minutes as needed for chest pain. 25 tablet 12   NUPLAZID 34 MG CAPS TAKE 1 CAPSULE BY MOUTH 1 TIME A DAY 90 capsule 0   OVER THE COUNTER MEDICATION Apply 1 application topically daily as needed (pain). Theraworx Pain Cream     Pimavanserin Tartrate (NUPLAZID) 34 MG CAPS Samples of this drug were given to the patient, quantity 5, Lot Number [id number] Exp 05/2022 30 capsule 0   polyethylene glycol (MIRALAX / GLYCOLAX) packet Take 8.5 g by mouth daily.     rosuvastatin (CRESTOR) 20 MG tablet TAKE 1 TAB BY MOUTH EVERYDAY AT BEDTIME 90 tablet 1   spironolactone (ALDACTONE) 25 MG tablet Take 0.5 tablets (12.5 mg total) by mouth daily. 45 tablet 3   tirzepatide (MOUNJARO) 7.5 MG/0.5ML Pen Inject 7.5 mg into the skin once a week. 6 mL 2   traMADol (ULTRAM) 50 MG tablet Take 1 tablet (50 mg total) by mouth every 12 (twelve) hours as needed. (Patient taking differently: Take 50 mg by mouth in the morning and at bedtime.) 60 tablet 2   triamcinolone cream (KENALOG) 0.1 % Apply 1 Application topically daily. Apply to rash 15 g 0   vitamin B-12 (CYANOCOBALAMIN) 1000 MCG tablet Take 1,000 mcg by mouth daily.     No current facility-administered medications for this visit.     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)   DM  (diabetes mellitus) (HCC)    TYPE 2 - insulin pump   Dysrhythmia    a-fib   GERD (gastroesophageal reflux disease)    Glaucoma    right eye   History of kidney stones    multiple   HLD (hyperlipidemia)    HTN (hypertension)    pt denies 08/19/12   Hyperplasia, prostate    Hypothyroidism    MI (myocardial infarction) (HCC)    Dr Jens Som 2000,  x3vessels bypass   OSA (obstructive sleep apnea)    AHI-28,on CPAP, noncompliant with CPAP   Parkinson disease (HCC)    1999   PONV (postoperative nausea and vomiting)    Restless legs    Shortness of breath    Hx: of at all times   UTI (lower urinary tract infection) 09/15/12   Klebsiella   Ventral hernia    Walker as ambulation aid    also uses wheelchair at home/when going out    ROS:   All systems reviewed and negative except as noted in the HPI.   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   BIOPSY  10/14/2018   Procedure: BIOPSY;  Surgeon: Beverley Fiedler, MD;  Location: [facility];  Service: Gastroenterology;;   CARDIAC CATHETERIZATION     CATARACT EXTRACTION W/ INTRAOCULAR LENS IMPLANT     Hx: of right eye   CATARACT EXTRACTION W/ INTRAOCULAR LENS IMPLANT Left 2018   COLONOSCOPY N/A 10/13/2014   Procedure: COLONOSCOPY;  Surgeon: Beverley Fiedler, MD;  Location: [facility];  Service: Gastroenterology;  Laterality: N/A;   COLONOSCOPY W/ BIOPSIES AND POLYPECTOMY     Hx: of   COLONOSCOPY WITH PROPOFOL N/A 10/14/2018   Procedure: COLONOSCOPY WITH PROPOFOL;  Surgeon: Beverley Fiedler, MD;  Location: [facility];  Service: Gastroenterology;  Laterality: N/A;   CORONARY ARTERY BYPASS GRAFT  2000   Tressie Stalker, MD   CORONARY STENT PLACEMENT  1998   DEEP BRAIN STIMULATOR PLACEMENT  2004   Right and left VIN stimulator placement (parkinsons)   EYE SURGERY     FINGER AMPUTATION     left pointer   IMPLANTABLE CARDIOVERTER DEFIBRILLATOR IMPLANT N/A 11/13/2013   Procedure:  IMPLANTABLE CARDIOVERTER DEFIBRILLATOR IMPLANT;  Surgeon: Marinus Maw, MD;  Location[facility] CATH LAB;  Service: Cardiovascular;  Laterality: N/A;   INSERT / REPLACE / REMOVE PACEMAKER     medtronic   LEFT AND RIGHT HEART CATHETERIZATION WITH CORONARY ANGIOGRAM N/A 09/24/2013   Procedure: LEFT AND RIGHT HEART CATHETERIZATION WITH CORONARY ANGIOGRAM;  Surgeon: Kathleene Hazel, MD;  Location[facility] C[facility] LAB;  Service: Cardiovascular;  Laterality: N/A;   LITHOTRIPSY     3 different times   MEDIAN STERNOTOMY  2000   POLYPECTOMY  10/14/2018   Procedure: POLYPECTOMY;  Surgeon: Beverley Fiedler, MD;  Location: [facility];  Service: Gastroenterology;;   PULSE GENERATOR IMPLANT Right 11/13/2017   Procedure: Right chest implantable pulse generator change;  Surgeon: Maeola Harman, MD;  Location: [facility] OR;  Service: Neurosurgery;  Laterality: Right;  Right chest implantable pulse generator change   SUBTHALAMIC STIMULATOR BATTERY REPLACEMENT N/A 09/05/2012   Procedure: Deep brain stimulator battery change;  Surgeon: Maeola Harman, MD;  Location: [facility];  Service: Neurosurgery;  Laterality: N/A;  Deep brain stimulator battery change   SUBTHALAMIC STIMULATOR BATTERY REPLACEMENT N/A 06/10/2015   Procedure: Deep Brain stimulator battery change;  Surgeon: Maeola Harman, MD;  Location: [facility];  Service: Neurosurgery;  Laterality: N/A;   SUBTHALAMIC STIMULATOR BATTERY REPLACEMENT Right 09/25/2019   Procedure: Deep brain stimulator battery change;  Surgeon: Maeola Harman, MD;  Location: [facility] OR;  Service: Neurosurgery;  Laterality: Right;   TONSILLECTOMY       Family History  Problem Relation Age of Onset   Aneurysm Mother    Alcoholism Father    HIV/AIDS Brother 86       AIDS   Healthy  Sister    Healthy Child    Peripheral vascular disease Other    Arthritis Other    Healthy Sister    Healthy Child    Healthy Child    Diabetes Neg Hx    Heart disease Neg Hx    Colon cancer Neg Hx    Prostate cancer  Neg Hx      Social History   Socioeconomic History   Marital status: Married    Spouse name: [name]   Number of children: 2   Years of education: 12   Highest education level: High school graduate  Occupational History   Occupation: DISABLED    Comment: [employer], [employer]  Tobacco Use   Smoking status: Never    Passive exposure: Never   Smokeless tobacco: Never  Vaping Use   Vaping status: Never Used  Substance and Sexual Activity   Alcohol use: Not Currently   Drug use: No   Sexual activity: Not Currently  Other Topics Concern   Not on file  Social History Narrative   From [place]   Retired/disability Archivist   Likes to fish.     Married 1972   3 kids   [place] fan   Social Determinants of Health   Financial Resource Strain: Low Risk  (12/23/2020)   Overall Financial Resource Strain (CARDIA)    Difficulty of Paying Living Expenses: Not hard at all  Food Insecurity: No Food Insecurity (12/28/2021)   Hunger Vital Sign    Worried About Running Out of Food in the Last Year: Never true    Ran Out of Food in the Last Year: Never true  Transportation Needs: No Transportation Needs (12/28/2021)   PRAPARE - Administrator, Civil Service (Medical): No    Lack of Transportation (Non-Medical): No  Physical Activity: Inactive (12/28/2021)   Exercise Vital Sign    Days of Exercise per Week: 0 days    Minutes of Exercise per Session: 0 min  Stress: No Stress Concern Present (12/28/2021)   [employer] of Occupational Health - Occupational Stress Questionnaire    Feeling of Stress : Not at all  Social Connections: Socially Integrated (12/28/2021)   Social Connection and Isolation Panel [NHANES]    Frequency of Communication with Friends and Family: More than three times a week    Frequency of Social Gatherings with Friends and Family: More than three times a week    Attends Religious Services: More than 4 times per year    Active  Member of [employer] or Organizations: Yes    Attends Engineer, structural: More than 4 times per year    Marital Status: Married  Catering manager Violence: Not At Risk (12/28/2021)   Humiliation, Afraid, Rape, and Kick questionnaire    Fear of Current or Ex-Partner: No    Emotionally Abused: No    Physically Abused: No    Sexually Abused: No     BP 136/68   Pulse 71   Ht 5\' 9"  (1.753 m)   Wt 259 lb (117.5 kg)   SpO2 99%   BMI 38.25 kg/m   Physical Exam:  Well appearing NAD HEENT: Unremarkable Neck:  No JVD, no thyromegally Lymphatics:  No adenopathy Back:  No CVA tenderness Lungs:  Clear with no wheezes HEART:  Regular rate rhythm, no murmurs, no rubs, no clicks Abd:  soft, positive bowel sounds, no organomegally, no rebound, no guarding Ext:  2 plus pulses, no edema, no cyanosis, no clubbing;minimal  tremor Skin:  No rashes no nodules Neuro:  CN II through XII intact, motor grossly intact  EKG - NSR with atrial sensing and ventricular pacing with fusion.  DEVICE  Normal device function.  See PaceArt for details.   Assess/Plan:   Chronic systolic heart failure - he has class 2 symptoms but is limited by his parkinsons' 2. ICM - he denies anginal symptoms. No chest pain. 3. ICD - his Medtronic ICD is working normally. 4. Obesity - he is encouraged to lose weight but this is difficult due to his lack of activity.   Sharlot Gowda Darric Plante,MD

## 2022-12-05 NOTE — Patient Instructions (Signed)
Medication Instructions:  Your physician recommends that you continue on your current medications as directed. Please refer to the Current Medication list given to you today.  *If you need a refill on your cardiac medications before your next appointment, please call your pharmacy*  Lab Work: None ordered.  If you have labs (blood work) drawn today and your tests are completely normal, you will receive your results only by: MyChart Message (if you have MyChart) OR A paper copy in the mail If you have any lab test that is abnormal or we need to change your treatment, we will call you to review the results.  Testing/Procedures: None ordered.  Follow-Up: At [facility], you and your health needs are our priority.  As part of our continuing mission to provide you with exceptional heart care, we have created designated Provider Care Teams.  These Care Teams include your primary Cardiologist (physician) and Advanced Practice Providers (APPs -  Physician Assistants and Nurse Practitioners) who all work together to provide you with the care you need, when you need it.   Your next appointment:   1 year(s)  The format for your next appointment:   In Person  Provider:   Lewayne Bunting, MD{or one of the following Advanced Practice Providers on your designated Care Team:   Francis Dowse, [place] Casimiro Needle "Mardelle Matte" [place], [place] Earnest Rosier, NP  Remote monitoring is used to monitor your Pacemaker/ ICD from home. This monitoring reduces the number of office visits required to check your device to one time per year. It allows [place] to keep an eye on the functioning of your device to ensure it is working properly.   Important Information About Sugar

## 2022-12-08 NOTE — Pre-Procedure Instructions (Signed)
Surgical Instructions   Your procedure is scheduled on December 15, 2022. Report to [facility] Main Entrance "A" at 5:30 A.M., then check in with the Admitting office. Any questions or running late day of surgery: call [phone]  Questions prior to your surgery date: call [phone], Monday-Friday, 8am-4pm. If you experience any cold or flu symptoms such as cough, fever, chills, shortness of breath, etc. between now and your scheduled surgery, please notify us at the above number.     Remember:  Do not eat or drink after midnight the night before your surgery    Take these medicines the morning of surgery with A SIP OF WATER: allopurinol (ZYLOPRIM)  Carbidopa-Levodopa ER (SINEMET CR)  dorzolamide-timolol (COSOPT) ophthalmic solution  DULoxetine (CYMBALTA)  fluticasone-salmeterol (ADVAIR)  icosapent Ethyl (VASCEPA)  levothyroxine (SYNTHROID)  metoprolol succinate (TOPROL-XL)  NUPLAZID  traMADol Janean Sark)    May take these medicines IF NEEDED: acetaminophen (TYLENOL)  albuterol (VENTOLIN HFA) inhaler  nitroGLYCERIN (NITROSTAT) - if dose taken prior to surgery, please call either of the above numbers   Follow your surgeon's instructions on when to stop Aspirin.  If no instructions were given by your surgeon then you will need to call the office to get those instructions.     One week prior to surgery, STOP taking any Aleve, Naproxen, Ibuprofen, Motrin, Advil, Goody's, BC's, all herbal medications, fish oil, and non-prescription vitamins.   WHAT DO I DO ABOUT MY DIABETES MEDICATION?   At midnight prior to surgery, decrease all basal rate insulins by 20% OR please reach out to the prescribing physician to receive insulin pump instructions.  STOP taking your tirzepatide [facility]) one week prior to surgery. DO NOT take any doses after November November 28th.  If your CBG is greater than 220 mg/dL, you may take  of your insulin lispro (HUMALOG).   HOW TO MANAGE YOUR  DIABETES BEFORE AND AFTER SURGERY  Why is it important to control my blood sugar before and after surgery? Improving blood sugar levels before and after surgery helps healing and can limit problems. A way of improving blood sugar control is eating a healthy diet by:  Eating less sugar and carbohydrates  Increasing activity/exercise  Talking with your doctor about reaching your blood sugar goals High blood sugars (greater than 180 mg/dL) can raise your risk of infections and slow your recovery, so you will need to focus on controlling your diabetes during the weeks before surgery. Make sure that the doctor who takes care of your diabetes knows about your planned surgery including the date and location.  How do I manage my blood sugar before surgery? Check your blood sugar at least 4 times a day, starting 2 days before surgery, to make sure that the level is not too high or low.  Check your blood sugar the morning of your surgery when you wake up and every 2 hours until you get to the Short Stay unit.  If your blood sugar is less than 70 mg/dL, you will need to treat for low blood sugar: Do not take insulin. Treat a low blood sugar (less than 70 mg/dL) with  cup of clear juice (cranberry or apple), 4 glucose tablets, OR glucose gel. Recheck blood sugar in 15 minutes after treatment (to make sure it is greater than 70 mg/dL). If your blood sugar is not greater than 70 mg/dL on recheck, call [phone] for further instructions. Report your blood sugar to the short stay nurse when you get to Short Stay.  If you are admitted to the hospital after surgery: Your blood sugar will be checked by the staff and you will probably be given insulin after surgery (instead of oral diabetes medicines) to make sure you have good blood sugar levels. The goal for blood sugar control after surgery is 80-180 mg/dL.                      Do NOT Smoke (Tobacco/Vaping) for 24 hours prior to your procedure.  If  you use a CPAP at night, you may bring your mask/headgear for your overnight stay.   You will be asked to remove any contacts, glasses, piercing's, hearing aid's, dentures/partials prior to surgery. Please bring cases for these items if needed.    Patients discharged the day of surgery will not be allowed to drive home, and someone needs to stay with them for 24 hours.  SURGICAL WAITING ROOM VISITATION Patients may have no more than 2 support people in the waiting area - these visitors may rotate.   Pre-op nurse will coordinate an appropriate time for 1 ADULT support person, who may not rotate, to accompany patient in pre-op.  Children under the age of 68 must have an adult with them who is not the patient and must remain in the main waiting area with an adult.  If the patient needs to stay at the hospital during part of their recovery, the visitor guidelines for inpatient rooms apply.  Please refer to the [facility] website for the visitor guidelines for any additional information.   If you received a COVID test during your pre-op visit  it is requested that you wear a mask when out in public, stay away from anyone that may not be feeling well and notify your surgeon if you develop symptoms. If you have been in contact with anyone that has tested positive in the last 10 days please notify you surgeon.      Pre-operative CHG Bathing Instructions   You can play a key role in reducing the risk of infection after surgery. Your skin needs to be as free of germs as possible. You can reduce the number of germs on your skin by washing with CHG (chlorhexidine gluconate) soap before surgery. CHG is an antiseptic soap that kills germs and continues to kill germs even after washing.   DO NOT use if you have an allergy to chlorhexidine/CHG or antibacterial soaps. If your skin becomes reddened or irritated, stop using the CHG and notify one of our RNs at [phone].              TAKE A SHOWER THE  NIGHT BEFORE SURGERY AND THE DAY OF SURGERY    Please keep in mind the following:  DO NOT shave, including legs and underarms, 48 hours prior to surgery.   You may shave your face before/day of surgery.  Place clean sheets on your bed the night before surgery Use a clean washcloth (not used since being washed) for each shower. DO NOT sleep with pet's night before surgery.  CHG Shower Instructions:  Wash your face and private area with normal soap. If you choose to wash your hair, wash first with your normal shampoo.  After you use shampoo/soap, rinse your hair and body thoroughly to remove shampoo/soap residue.  Turn the water OFF and apply half the bottle of CHG soap to a CLEAN washcloth.  Apply CHG soap ONLY FROM YOUR NECK DOWN TO YOUR TOES (washing for 3-5 minutes)  DO NOT use CHG soap on face, private areas, open wounds, or sores.  Pay special attention to the area where your surgery is being performed.  If you are having back surgery, having someone wash your back for you may be helpful. Wait 2 minutes after CHG soap is applied, then you may rinse off the CHG soap.  Pat dry with a clean towel  Put on clean pajamas    Additional instructions for the day of surgery: DO NOT APPLY any lotions, deodorants, cologne, or perfumes.   Do not wear jewelry or makeup Do not wear nail polish, gel polish, artificial nails, or any other type of covering on natural nails (fingers and toes) Do not bring valuables to the hospital. [facility] is not responsible for valuables/personal belongings. Put on clean/comfortable clothes.  Please brush your teeth.  Ask your nurse before applying any prescription medications to the skin.

## 2022-12-11 ENCOUNTER — Encounter: Payer: Self-pay | Admitting: Internal Medicine

## 2022-12-11 ENCOUNTER — Encounter (HOSPITAL_COMMUNITY): Payer: Self-pay

## 2022-12-11 ENCOUNTER — Other Ambulatory Visit: Payer: Self-pay

## 2022-12-11 ENCOUNTER — Inpatient Hospital Stay (HOSPITAL_COMMUNITY)
Admission: RE | Admit: 2022-12-11 | Discharge: 2022-12-11 | Disposition: A | Discharge status: Home / Self Care | Payer: Medicare Other | Source: Ambulatory Visit | Attending: Neurological Surgery

## 2022-12-11 VITALS — BP 117/69 | HR 77 | Temp 97.9°F | Resp 17 | Ht 69.0 in | Wt 260.0 lb

## 2022-12-11 DIAGNOSIS — K219 Gastro-esophageal reflux disease without esophagitis: Secondary | ICD-10-CM | POA: Diagnosis not present

## 2022-12-11 DIAGNOSIS — E1122 Type 2 diabetes mellitus with diabetic chronic kidney disease: Secondary | ICD-10-CM | POA: Insufficient documentation

## 2022-12-11 DIAGNOSIS — G4733 Obstructive sleep apnea (adult) (pediatric): Secondary | ICD-10-CM | POA: Insufficient documentation

## 2022-12-11 DIAGNOSIS — G20A1 Parkinson's disease without dyskinesia, without mention of fluctuations: Secondary | ICD-10-CM | POA: Insufficient documentation

## 2022-12-11 DIAGNOSIS — I2510 Atherosclerotic heart disease of native coronary artery without angina pectoris: Secondary | ICD-10-CM | POA: Diagnosis not present

## 2022-12-11 DIAGNOSIS — Z01812 Encounter for preprocedural laboratory examination: Secondary | ICD-10-CM | POA: Insufficient documentation

## 2022-12-11 DIAGNOSIS — N182 Chronic kidney disease, stage 2 (mild): Secondary | ICD-10-CM | POA: Insufficient documentation

## 2022-12-11 DIAGNOSIS — J45909 Unspecified asthma, uncomplicated: Secondary | ICD-10-CM | POA: Diagnosis not present

## 2022-12-11 DIAGNOSIS — I5022 Chronic systolic (congestive) heart failure: Secondary | ICD-10-CM | POA: Insufficient documentation

## 2022-12-11 DIAGNOSIS — E039 Hypothyroidism, unspecified: Secondary | ICD-10-CM | POA: Insufficient documentation

## 2022-12-11 DIAGNOSIS — Z951 Presence of aortocoronary bypass graft: Secondary | ICD-10-CM | POA: Diagnosis not present

## 2022-12-11 DIAGNOSIS — I252 Old myocardial infarction: Secondary | ICD-10-CM | POA: Diagnosis not present

## 2022-12-11 DIAGNOSIS — I130 Hypertensive heart and chronic kidney disease with heart failure and stage 1 through stage 4 chronic kidney disease, or unspecified chronic kidney disease: Secondary | ICD-10-CM | POA: Diagnosis not present

## 2022-12-11 DIAGNOSIS — Z4542 Encounter for adjustment and management of neuropacemaker (brain) (peripheral nerve) (spinal cord): Secondary | ICD-10-CM | POA: Diagnosis not present

## 2022-12-11 DIAGNOSIS — H409 Unspecified glaucoma: Secondary | ICD-10-CM | POA: Insufficient documentation

## 2022-12-11 DIAGNOSIS — E119 Type 2 diabetes mellitus without complications: Secondary | ICD-10-CM

## 2022-12-11 HISTORY — DX: Myoneural disorder, unspecified: G70.9

## 2022-12-11 LAB — BASIC METABOLIC PANEL
Anion gap: 11 (ref 5–15)
BUN: 20 mg/dL (ref 8–23)
CO2: 27 mmol/L (ref 22–32)
Calcium: 9 mg/dL (ref 8.9–10.3)
Chloride: 99 mmol/L (ref 98–111)
Creatinine, Ser: 1.19 mg/dL (ref 0.61–1.24)
GFR, Estimated: >60 mL/min (ref >60)
Glucose, Bld: 154 mg/dL — ABNORMAL HIGH (ref 70–99)
Potassium: 4.1 mmol/L (ref 3.5–5.1)
Sodium: 137 mmol/L (ref 135–145)

## 2022-12-11 LAB — CBC
HCT: 43.8 % (ref 39.0–52.0)
Hemoglobin: 14.2 g/dL (ref 13.0–17.0)
MCH: 28.7 pg (ref 26.0–34.0)
MCHC: 32.4 g/dL (ref 30.0–36.0)
MCV: 88.5 fL (ref 80.0–100.0)
Platelets: 156 10*3/uL (ref 150–400)
RBC: 4.95 MIL/uL (ref 4.22–5.81)
RDW: 13.6 % (ref 11.5–15.5)
WBC: 8.9 10*3/uL (ref 4.0–10.5)
nRBC: 0 % (ref 0.0–0.2)

## 2022-12-11 LAB — GLUCOSE, CAPILLARY: Glucose-Capillary: 140 mg/dL — ABNORMAL HIGH (ref 70–99)

## 2022-12-11 NOTE — Progress Notes (Signed)
PCP - Dr. Crawford Givens Cardiologist - Dr. Olga Millers - last office visit 07/18/2022 Electrophysiologist - Dr. Lewayne Bunting - last office visit 12/05/2022 Endocrinologist - Dr. Thapa Iraq  PPM/ICD - Medtronic ICD Device Orders - Device Orders requested 12/08/22. Awaiting response. Rep Notified - Medtronic Rep notified via email 12/08/22  Chest x-ray - 04/03/2022 EKG - 12/05/2022 Stress Test - 06/18/2013 ECHO - 01/23/2022 Cardiac Cath - 09/24/2013  Sleep Study - +OSA but pt unable to tolerate CPAP  Pt is DM1. He has a Dexcom on abdomen. Normal fasting blood sugar 120-130s. CBG at pre-op 140. Last A1c 6.5 on 10/30/22.  Last dose of GLP1 agonist- Last dose of Mounjaro 12/09/22  GLP1 instructions: Pt instructed to not take anymore doses until AFTER surgery  Blood Thinner Instructions: n/a Aspirin Instructions: Pt instructed to stop ASA one week prior. Last dose 12/08/22  NPO after midnight  COVID TEST- n/a   Anesthesia review: Yes. Cardiac hx including MI with CABG x3 in 2000 and stents, HTN, ICD. OSA and DM2   Patient denies shortness of breath, fever, cough and chest pain at PAT appointment. Pt denies any respiratory illness/infection in the last two months.   All instructions explained to the patient and patients wife Henry [name], with a verbal understanding of the material. Patient agrees to go over the instructions while at home for a better understanding. Patient also instructed to self quarantine after being tested for COVID-19. The opportunity to ask questions was provided.

## 2022-12-11 NOTE — Progress Notes (Signed)
PERIOPERATIVE PRESCRIPTION FOR IMPLANTED CARDIAC DEVICE PROGRAMMING  Patient Information: Name:  Henry [name]  DOB:  04/07/1950  MRN:  [id number]    Planned Procedure:  Implantable Pulse Generator Battery Replacement, Right  Surgeon:  Dr. Kendell Bane Dawley  Date of Procedure:  12/15/2022  Cautery will be used.  Position during surgery:  Supine   Please send documentation back to:  [facility] (Fax # [phone])  Device Information:  Clinic EP Physician:  Lewayne Bunting, MD   Device Type:  Defibrillator Manufacturer and Phone #:  Medtronic: [phone] Pacemaker Dependent?:  No. Date of Last Device Check:  12/05/22 Normal Device Function?:  Yes.    Electrophysiologist's Recommendations:  Have magnet available. Provide continuous ECG monitoring when magnet is used or reprogramming is to be performed.  Procedure will likely interfere with device function.  Device should be programmed:  Tachy therapies disabled  Per Device Clinic Standing Orders, Lenor Coffin, RN  2:07 PM 12/11/2022

## 2022-12-12 NOTE — Anesthesia Preprocedure Evaluation (Addendum)
Anesthesia Evaluation  Patient identified by MRN, date of birth, ID band Patient awake    Reviewed: Allergy & Precautions, H&P , NPO status , Patient's Chart, lab work & pertinent test results  Airway Mallampati: III  TM Distance: <3 FB Neck ROM: Limited    Dental no notable dental hx.    Pulmonary asthma , sleep apnea    Pulmonary exam normal breath sounds clear to auscultation       Cardiovascular hypertension, + CAD, + Past MI and + CABG  Normal cardiovascular exam+ dysrhythmias + Cardiac Defibrillator  Rhythm:Regular Rate:Normal  1. Left ventricular ejection fraction, by estimation, is 30 to 35%. The  left ventricle has moderately decreased function. The left ventricle  demonstrates global hypokinesis. Left ventricular diastolic parameters are  consistent with Grade II diastolic  dysfunction (pseudonormalization).   2. Right ventricular systolic function is mildly reduced. The right  ventricular size is normal.   3. Left atrial size was mildly dilated.   4. Right atrial size was mildly dilated.   5. The mitral valve is normal in structure. Trivial mitral valve  regurgitation. No evidence of mitral stenosis.   6. The aortic valve is tricuspid. There is mild calcification of the  aortic valve. Aortic valve regurgitation is not visualized. No aortic  stenosis is present.   7. Aortic dilatation noted. There is borderline dilatation of the  ascending aorta, measuring 38 mm.   8. The inferior vena cava is normal in size with greater than 50%  respiratory variability, suggesting right atrial pressure of 3 mmHg     Neuro/Psych Parkinsons disease  Neuromuscular disease  negative psych ROS   GI/Hepatic Neg liver ROS,,,  Endo/Other  diabetes, Type 2, Insulin DependentHypothyroidism  Class 3 obesity  Renal/GU negative Renal ROS  negative genitourinary   Musculoskeletal negative musculoskeletal ROS (+)    Abdominal    Peds negative pediatric ROS (+)  Hematology negative hematology ROS (+)   Anesthesia Other Findings   Reproductive/Obstetrics negative OB ROS                             Anesthesia Physical Anesthesia Plan  ASA: 4  Anesthesia Plan: General   Post-op Pain Management: Minimal or no pain anticipated   Induction: Intravenous  PONV Risk Score and Plan: 2 and Ondansetron, Dexamethasone and Treatment may vary due to age or medical condition  Airway Management Planned: Oral ETT and Video Laryngoscope Planned  Additional Equipment:   Intra-op Plan:   Post-operative Plan: Extubation in OR  Informed Consent: I have reviewed the patients History and Physical, chart, labs and discussed the procedure including the risks, benefits and alternatives for the proposed anesthesia with the patient or authorized representative who has indicated his/her understanding and acceptance.     Dental advisory given  Plan Discussed with: CRNA and Surgeon  Anesthesia Plan Comments: (PAT note written 12/12/2022 by Shonna Chock, PA-C.  )       Anesthesia Quick Evaluation

## 2022-12-12 NOTE — Progress Notes (Signed)
Anesthesia Chart Review:  Case: [id number] Date/Time: 12/15/22 0715   Procedure: IPG BATTERY REPLACEMENT (Right) - 3C   Anesthesia type: General   Pre-op diagnosis: END OF BATTERY LIFE OF DEEP BRAIN STIMULATOR   [facility] / [facility]   Surgeons: Dawley, Alan Mulder, DO       DISCUSSION: Patient is a 72 year old male scheduled for the above procedure.  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  Last EP visit with Dr. Ladona Ridgel was on 12/05/22 for ICD and chronic systolic CHF follow-up. Echo 01/23/22 showed EF 30-35%, global LV hypokinesis, grade II diastolic dysfunction, mild RV systolic dysfunction, trivial MR. He had class 2 symptoms but limited by his Parkinson's disease. Medtronic ICD working normally. No anginal symptoms. 1 year follow-up planned. Last visit with Dr. Jens Som was on 07/18/22. Continue medical therapy for CAD. Not on anticoagulation for PAF given Parkinson's with recurrent falls. On Lasix 60 mg daily, Toprol 50 mg daily, and losartan 25 mg daily. He notes that patient has had significant orthostasis in the past due to autonomic dysfunction/Parkinson's, so they had not been aggressive in advancing his medication. He added 12.5 mg spironolactone for CHF. Six month follow-up planned.      EP Perioperative ICD Recommendations: Device Information: Clinic EP Physician:  Lewayne Bunting, MD  Device Type:  Defibrillator Manufacturer and Phone #:  Medtronic: [phone] Pacemaker Dependent?:  No. Date of Last Device Check:  12/05/22         Normal Device Function?:  Yes.     Electrophysiologist's  Recommendations:   Have magnet available. Provide continuous ECG monitoring when magnet is used or reprogramming is to be performed.  Procedure will likely interfere with device function.  Device should be programmed:  Tachy therapies disabled  A1c 6.5% on 10/30/22. He has a Dexcom6 monitor. Last [facility] 12/09/22 and advised to hold until after surgery.  Anesthesia team to evaluate on the day of surgery.    VS: BP 117/69   Pulse 77   Temp 36.6 C   Resp 17   Ht 5\' 9"  (1.753 m)   Wt 117.9 kg   SpO2 97%   BMI 38.40 kg/m   PROVIDERS: Joaquim Nam, MD is PCP  Olga Millers, MD is cardiologist Lewayne Bunting, MD is EP cardiologist [place], [place], MD is endocrinologist Anthony Sar, MD is nephrologist Tat, Lurena Joiner, DO is neurologist   LABS: Labs reviewed: Acceptable for surgery. A1c 6.5% and TSH 6.26 on 10/30/22, and levothyroxine increased from 137 mcg daily to 150 mcg daily with plans to recheck thyroid panel in January 2025.  (all labs ordered are listed, but only abnormal results are displayed)  Labs Reviewed  GLUCOSE, CAPILLARY - Abnormal; Notable for the following components:      Result Value   Glucose-Capillary 140 (*)    All other components within normal limits  BASIC METABOLIC PANEL - Abnormal; Notable for the following components:   Glucose, Bld 154 (*)    All other components within normal limits  CBC    IMAGES: CT Chest 05/09/22: IMPRESSION: 1. No acute noncontrast chest CT findings. No infrahilar or other focal infiltrates. 2. Chronic elevation right hemidiaphragm. 3. Chronic dystrophic  calcifications in the base of the pericardium on the right. 4. Aortic and coronary artery atherosclerosis.  Old CABG. 5. Slightly prominent pulmonary trunk indicating arterial hypertension. 6. Old left apical myocardial infarct. 7. Calcified granulomas in the left lower lobe and liver. 8. Cholelithiasis.   EKG: 12/05/22: Atrial-sensed ventricular-paced rhythm  with prolonged AV conduction When compared with ECG of 18-Jul-2022 11:00, Electronic ventricular pacemaker is present Confirmed by Lewayne Bunting [phone]) on 12/05/2022 3:54:58 PM   CV: Echo 01/23/22: IMPRESSIONS   1. Left ventricular ejection fraction, by estimation, is 30 to 35%. The  left ventricle has moderately decreased function. The left ventricle  demonstrates global hypokinesis. Left ventricular diastolic parameters are  consistent with Grade II diastolic  dysfunction (pseudonormalization).   2. Right ventricular systolic function is mildly reduced. The right  ventricular size is normal.   3. Left atrial size was mildly dilated.   4. Right atrial size was mildly dilated.   5. The mitral valve is normal in structure. Trivial mitral valve  regurgitation. No evidence of mitral stenosis.   6. The aortic valve is tricuspid. There is mild calcification of the  aortic valve. Aortic valve regurgitation is not visualized. No aortic  stenosis is present.   7. Aortic dilatation noted. There is borderline dilatation of the  ascending aorta, measuring 38 mm.   8. The inferior vena cava is normal in size with greater than 50%  respiratory variability, suggesting right atrial pressure of 3 mmHg.  - Comparison: LVEF 40-45%, no RWMA 06/02/19; LVEF 30-35% 08/13/13; EF 25-30%, dyskinesis of the mid-distalanteroseptal myocardium 05/13/13; EF 30-35%, hypokinesis of the anteroseptal myocardium, hypokinesis of the apical myocardium, grade 2 DD 05/30/11   RHC/LHC 09/24/13: Hemodynamic Findings: Ao:  119/76            LV: 118/12/19 RA:  13           RV: 38/14/17 PA: 32/20 (mean 25)        PCWP:  16 Fick Cardiac Output:  6 L/min Fick Cardiac Index: 2.6 L/min/m2 Central Aortic Saturation: 90% Pulmonary Artery Saturation: 63%   Angiographic Findings: Left main: No obstructive disease.    Left Anterior Descending Artery: Large caliber vessel that courses to the apex. 100% mid occlusion. The mid and  distal fills from the patent bypass graft. The diagonal fills from the patent vein graft.    Circumflex Artery: Large caliber vessel with aneurysmal segment mid vessel. Large obtuse marginal branch. No obstructive disease.    Right Coronary Artery: Large dominant vessel with 30% mid stenosis, diffuse severe distal stenosis. Competitive filling is seen beginning in the mid vessel from the patent vein graft.    Graft Anatomy:  SVG to PDA is patent SVG to diagonal is patent LIMA graft to mid LAD is patent.    Left Ventricular Angiogram: Deferred.    Impression: 1. Severe double vessel CAD s/p 3V CABG with patent grafts to the LAD, Diagonal and RCA.  2. Mildly elevated filling pressures 3. Dyspnea likely multi-factorial due to obesity, deconditioning, OSA, cardiomyopathy   Recommendations: Continue medical management. Cath follow up in the office with Dr. Jens Som or one of the office NPs/PAs. Further planning for ICD per Dr. Ladona Ridgel.    Nuclear stress test 06/17/13: IMPRESSION: Low risk stress nuclear study.  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 No reversible ischemia. LV Ejection Fraction: 41%.  LV Wall Motion:  Distal anteroseptal and apical hypokinesia.   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)   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   Dysrhythmia    a-fib   GERD (gastroesophageal reflux disease)    Glaucoma    right eye   History of kidney stones    multiple   HLD (hyperlipidemia)    HTN (hypertension)    pt denies 08/19/12   Hyperplasia, prostate    Hypothyroidism    MI  (myocardial infarction) (HCC)    Dr Jens Som 2000, x3vessels bypass   Neuromuscular disorder (HCC)    Parkinson's Disease   OSA (obstructive sleep apnea)    AHI-28,on CPAP, noncompliant with CPAP   Parkinson disease (HCC)    1999   PONV (postoperative nausea and vomiting)    Restless legs    Shortness of breath    Hx: of at all times   UTI (lower urinary tract infection) 09/15/2012   Klebsiella   Ventral hernia    Walker as ambulation aid    also uses wheelchair at home/when going out    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   BIOPSY  10/14/2018   Procedure: BIOPSY;  Surgeon: Beverley Fiedler, MD;  Location: [facility];  Service: Gastroenterology;;   CATARACT EXTRACTION W/ INTRAOCULAR LENS IMPLANT     Hx: of right eye   CATARACT EXTRACTION W/ INTRAOCULAR LENS IMPLANT Left 2018   COLONOSCOPY N/A 10/13/2014   Procedure: COLONOSCOPY;  Surgeon: Beverley Fiedler, MD;  Location: [facility];  Service: Gastroenterology;  Laterality: N/A;   COLONOSCOPY W/ BIOPSIES AND POLYPECTOMY     Hx: of   COLONOSCOPY WITH PROPOFOL N/A 10/14/2018   Procedure: COLONOSCOPY WITH PROPOFOL;  Surgeon: Beverley Fiedler, MD;  Location: [facility];  Service: Gastroenterology;  Laterality: N/A;   CORONARY ARTERY BYPASS GRAFT  2000   Tressie Stalker, MD   CORONARY STENT PLACEMENT  1998   DEEP BRAIN STIMULATOR PLACEMENT  2004   Right and left VIN stimulator placement (parkinsons)   EYE SURGERY     Cataract removed in 1980s related to wood in eye when 72 years old   FINGER AMPUTATION     left pointer   IMPLANTABLE CARDIOVERTER DEFIBRILLATOR IMPLANT N/A 11/13/2013   Procedure: IMPLANTABLE CARDIOVERTER DEFIBRILLATOR IMPLANT;  Surgeon: Marinus Maw, MD;  Location: [facility] CATH LAB;  Service: Cardiovascular;  Laterality: N/A;   INSERT / REPLACE / REMOVE PACEMAKER     medtronic   LEFT AND RIGHT HEART CATHETERIZATION WITH CORONARY ANGIOGRAM N/A 09/24/2013    Procedure: LEFT AND RIGHT HEART CATHETERIZATION WITH CORONARY ANGIOGRAM;  Surgeon: Kathleene Hazel, MD;  Location: [facility] CATH LAB;  Service: Cardiovascular;  Laterality: N/A;   LITHOTRIPSY     3 different times   POLYPECTOMY  10/14/2018   Procedure: POLYPECTOMY;  Surgeon: Beverley Fiedler, MD;  Location: [facility];  Service: Gastroenterology;;   PULSE GENERATOR IMPLANT Right 11/13/2017   Procedure: Right chest implantable pulse generator change;  Surgeon: Maeola Harman, MD;  Location:  [facility] OR;  Service: Neurosurgery;  Laterality: Right;  Right chest implantable pulse generator change   SUBTHALAMIC STIMULATOR BATTERY REPLACEMENT N/A 09/05/2012   Procedure: Deep brain stimulator battery change;  Surgeon: Maeola Harman, MD;  Location: [facility] NEURO [facility];  Service: Neurosurgery;  Laterality: N/A;  Deep brain stimulator battery change   SUBTHALAMIC STIMULATOR BATTERY REPLACEMENT N/A 06/10/2015   Procedure: Deep Brain stimulator battery change;  Surgeon: Maeola Harman, MD;  Location: [facility]UR[facility];  Service: Neurosurgery;  Laterality: N/A;   SUBTHALAMIC STIMULATOR BATTERY REPLACEMENT Right 09/25/2019   Procedure: Deep brain stimulator battery change;  Surgeon: Maeola Harman, MD;  [facility] OR;  Service: Neurosurgery;  Laterality: Right;   TONSILLECTOMY      MEDICATIONS:  acetaminophen (TYLENOL) 500 MG tablet   albuterol (VENTOLIN HFA) 108 (90 Base) MCG/ACT inhaler   allopurinol (ZYLOPRIM) 100 MG tablet   aspirin EC 81 MG tablet   Carbidopa-Levodopa ER (SINEMET CR) 25-100 MG tablet controlled release   Cholecalciferol (VITAMIN D) 50 MCG (2000 UT) tablet   clotrimazole (LOTRIMIN) 1 % cream   colchicine 0.6 MG tablet   Continuous Blood Gluc Receiver (DEXCOM G6 RECEIVER) DEVI   Continuous Glucose Sensor (DEXCOM G6 SENSOR) MISC   Continuous Glucose Transmitter (DEXCOM G6 TRANSMITTER) MISC   dorzolamide-timolol (COSOPT) 22.3-6.8 MG/ML ophthalmic solution   DULoxetine (CYMBALTA) 30 MG capsule    fluticasone-salmeterol (ADVAIR) 250-50 MCG/ACT AEPB   FREESTYLE LITE test strip   furosemide (LASIX) 20 MG tablet   furosemide (LASIX) 80 MG tablet   icosapent Ethyl (VASCEPA) 1 g capsule   Insulin Disposable Pump (OMNIPOD 5 G6 INTRO, GEN 5,) KIT   Insulin Disposable Pump (OMNIPOD 5 G6 PODS, GEN 5,) MISC   insulin lispro (HUMALOG) 100 UNIT/ML injection   KLOR-CON M20 20 MEQ tablet   levothyroxine (SYNTHROID) 150 MCG tablet   losartan (COZAAR) 25 MG tablet   metoprolol succinate (TOPROL-XL) 50 MG 24 hr tablet   nitroGLYCERIN (NITROSTAT) 0.4 MG SL tablet   NUPLAZID 34 MG CAPS   OVER THE COUNTER MEDICATION   Pimavanserin Tartrate (NUPLAZID) 34 MG CAPS   polyethylene glycol (MIRALAX / GLYCOLAX) packet   rosuvastatin (CRESTOR) 20 MG tablet   spironolactone (ALDACTONE) 25 MG tablet   tirzepatide (MOUNJARO) 7.5 MG/0.5ML Pen   traMADol (ULTRAM) 50 MG tablet   triamcinolone cream (KENALOG) 0.1 %   vitamin B-12 (CYANOCOBALAMIN) 1000 MCG tablet   No current facility-administered medications for this encounter.    Shonna Chock, PA-C Surgical Short Stay/Anesthesiology [facility] Phone [phone] [facility] Phone [phone] 12/12/2022 6:37 PM

## 2022-12-14 ENCOUNTER — Other Ambulatory Visit: Payer: Self-pay | Admitting: Cardiology

## 2022-12-14 DIAGNOSIS — I5022 Chronic systolic (congestive) heart failure: Secondary | ICD-10-CM

## 2022-12-14 DIAGNOSIS — I2510 Atherosclerotic heart disease of native coronary artery without angina pectoris: Secondary | ICD-10-CM

## 2022-12-14 NOTE — Progress Notes (Signed)
Pt aware to be here at 0615.

## 2022-12-15 ENCOUNTER — Encounter (HOSPITAL_COMMUNITY): Payer: Self-pay | Admitting: Neurological Surgery

## 2022-12-15 ENCOUNTER — Encounter (HOSPITAL_COMMUNITY)
Admission: RE | Disposition: A | Discharge status: Home / Self Care | Payer: Self-pay | Source: Home / Self Care | Attending: Neurological Surgery

## 2022-12-15 ENCOUNTER — Ambulatory Visit (HOSPITAL_COMMUNITY)
Admission: RE | Admit: 2022-12-15 | Discharge: 2022-12-15 | Disposition: A | Discharge status: Home / Self Care | Payer: Medicare Other | Attending: Neurological Surgery | Admitting: Neurological Surgery

## 2022-12-15 ENCOUNTER — Other Ambulatory Visit: Payer: Self-pay

## 2022-12-15 ENCOUNTER — Inpatient Hospital Stay (HOSPITAL_COMMUNITY): Payer: Medicare Other | Admitting: Registered Nurse

## 2022-12-15 ENCOUNTER — Inpatient Hospital Stay (HOSPITAL_COMMUNITY): Payer: Medicare Other | Admitting: Physician Assistant

## 2022-12-15 DIAGNOSIS — Z4542 Encounter for adjustment and management of neuropacemaker (brain) (peripheral nerve) (spinal cord): Secondary | ICD-10-CM | POA: Insufficient documentation

## 2022-12-15 DIAGNOSIS — I2510 Atherosclerotic heart disease of native coronary artery without angina pectoris: Secondary | ICD-10-CM | POA: Diagnosis not present

## 2022-12-15 DIAGNOSIS — E039 Hypothyroidism, unspecified: Secondary | ICD-10-CM | POA: Insufficient documentation

## 2022-12-15 DIAGNOSIS — Z9682 Presence of neurostimulator: Secondary | ICD-10-CM | POA: Diagnosis not present

## 2022-12-15 DIAGNOSIS — Z6838 Body mass index (BMI) 38.0-38.9, adult: Secondary | ICD-10-CM | POA: Insufficient documentation

## 2022-12-15 DIAGNOSIS — Z794 Long term (current) use of insulin: Secondary | ICD-10-CM | POA: Diagnosis not present

## 2022-12-15 DIAGNOSIS — Z7985 Long-term (current) use of injectable non-insulin antidiabetic drugs: Secondary | ICD-10-CM | POA: Diagnosis not present

## 2022-12-15 DIAGNOSIS — I252 Old myocardial infarction: Secondary | ICD-10-CM | POA: Insufficient documentation

## 2022-12-15 DIAGNOSIS — Z45010 Encounter for checking and testing of cardiac pacemaker pulse generator [battery]: Secondary | ICD-10-CM | POA: Diagnosis not present

## 2022-12-15 DIAGNOSIS — E119 Type 2 diabetes mellitus without complications: Secondary | ICD-10-CM

## 2022-12-15 DIAGNOSIS — I10 Essential (primary) hypertension: Secondary | ICD-10-CM | POA: Insufficient documentation

## 2022-12-15 DIAGNOSIS — E66813 Obesity, class 3: Secondary | ICD-10-CM | POA: Insufficient documentation

## 2022-12-15 DIAGNOSIS — J45909 Unspecified asthma, uncomplicated: Secondary | ICD-10-CM | POA: Diagnosis not present

## 2022-12-15 DIAGNOSIS — G20C Parkinsonism, unspecified: Secondary | ICD-10-CM | POA: Diagnosis not present

## 2022-12-15 DIAGNOSIS — Z79890 Hormone replacement therapy (postmenopausal): Secondary | ICD-10-CM | POA: Diagnosis not present

## 2022-12-15 DIAGNOSIS — Z9641 Presence of insulin pump (external) (internal): Secondary | ICD-10-CM | POA: Diagnosis not present

## 2022-12-15 DIAGNOSIS — N189 Chronic kidney disease, unspecified: Secondary | ICD-10-CM | POA: Diagnosis not present

## 2022-12-15 DIAGNOSIS — E1122 Type 2 diabetes mellitus with diabetic chronic kidney disease: Secondary | ICD-10-CM | POA: Diagnosis not present

## 2022-12-15 DIAGNOSIS — G4733 Obstructive sleep apnea (adult) (pediatric): Secondary | ICD-10-CM | POA: Insufficient documentation

## 2022-12-15 DIAGNOSIS — G20A1 Parkinson's disease without dyskinesia, without mention of fluctuations: Secondary | ICD-10-CM | POA: Insufficient documentation

## 2022-12-15 DIAGNOSIS — Z4549 Encounter for adjustment and management of other implanted nervous system device: Secondary | ICD-10-CM | POA: Diagnosis not present

## 2022-12-15 HISTORY — PX: SUBTHALAMIC STIMULATOR BATTERY REPLACEMENT: SHX5405

## 2022-12-15 LAB — GLUCOSE, CAPILLARY
Glucose-Capillary: 123 mg/dL — ABNORMAL HIGH (ref 70–99)
Glucose-Capillary: 117 mg/dL — ABNORMAL HIGH (ref 70–99)
Glucose-Capillary: 132 mg/dL — ABNORMAL HIGH (ref 70–99)

## 2022-12-15 SURGERY — SUBTHALAMIC STIMULATOR BATTERY REPLACEMENT
Anesthesia: General | Laterality: Right

## 2022-12-15 MED ORDER — LIDOCAINE-EPINEPHRINE 1 %-1:100000 IJ SOLN
INTRAMUSCULAR | Status: DC | PRN
  Administered 2022-12-15: 10 mL

## 2022-12-15 MED ORDER — 0.9 % SODIUM CHLORIDE (POUR BTL) OPTIME
TOPICAL | Status: DC | PRN
  Administered 2022-12-15: 1000 mL

## 2022-12-15 MED ORDER — BACITRACIN ZINC 500 UNIT/GM EX OINT
TOPICAL_OINTMENT | CUTANEOUS | Status: AC
  Filled 2022-12-15: qty 28.35

## 2022-12-15 MED ORDER — VANCOMYCIN HCL 1.5 G IV SOLR
1500.0000 mg | INTRAVENOUS | Status: AC
  Administered 2022-12-15: 1500 mg via INTRAVENOUS
  Filled 2022-12-15: qty 30

## 2022-12-15 MED ORDER — CHLORHEXIDINE GLUCONATE CLOTH 2 % EX PADS: 6.0000 | MEDICATED_PAD | Freq: Once | CUTANEOUS | Status: DC

## 2022-12-15 MED ORDER — FENTANYL CITRATE (PF) 250 MCG/5ML IJ SOLN
INTRAMUSCULAR | Status: AC
  Filled 2022-12-15: qty 5

## 2022-12-15 MED ORDER — VANCOMYCIN HCL 1000 MG IV SOLR
INTRAVENOUS | Status: DC | PRN
  Administered 2022-12-15: 1000 mg

## 2022-12-15 MED ORDER — ASPIRIN EC 81 MG PO TBEC: 81.0000 mg | DELAYED_RELEASE_TABLET | Freq: Every day | ORAL | Status: DC

## 2022-12-15 MED ORDER — PROPOFOL 10 MG/ML IV BOLUS
INTRAVENOUS | Status: AC
  Filled 2022-12-15: qty 20

## 2022-12-15 MED ORDER — LACTATED RINGERS IV SOLN: INTRAVENOUS | Status: DC

## 2022-12-15 MED ORDER — ORAL CARE MOUTH RINSE: 15.0000 mL | Freq: Once | OROMUCOSAL | Status: AC

## 2022-12-15 MED ORDER — LIDOCAINE-EPINEPHRINE 1 %-1:100000 IJ SOLN
INTRAMUSCULAR | Status: AC
  Filled 2022-12-15: qty 1

## 2022-12-15 MED ORDER — ONDANSETRON HCL 4 MG/2ML IJ SOLN
INTRAMUSCULAR | Status: DC | PRN
  Administered 2022-12-15: 4 mg via INTRAVENOUS

## 2022-12-15 MED ORDER — FENTANYL CITRATE (PF) 250 MCG/5ML IJ SOLN
INTRAMUSCULAR | Status: DC | PRN
  Administered 2022-12-15: 25 ug via INTRAVENOUS

## 2022-12-15 MED ORDER — ONDANSETRON HCL 4 MG/2ML IJ SOLN: 4.0000 mg | Freq: Once | INTRAMUSCULAR | Status: DC | PRN

## 2022-12-15 MED ORDER — ROCURONIUM BROMIDE 10 MG/ML (PF) SYRINGE
PREFILLED_SYRINGE | INTRAVENOUS | Status: DC | PRN
  Administered 2022-12-15: 60 mg via INTRAVENOUS

## 2022-12-15 MED ORDER — SUGAMMADEX SODIUM 200 MG/2ML IV SOLN
INTRAVENOUS | Status: DC | PRN
  Administered 2022-12-15: 475.2 mg via INTRAVENOUS

## 2022-12-15 MED ORDER — PHENYLEPHRINE HCL-NACL 20-0.9 MG/250ML-% IV SOLN
INTRAVENOUS | Status: DC | PRN
  Administered 2022-12-15: 50 ug/min via INTRAVENOUS

## 2022-12-15 MED ORDER — FENTANYL CITRATE (PF) 100 MCG/2ML IJ SOLN: 25.0000 ug | INTRAMUSCULAR | Status: DC | PRN

## 2022-12-15 MED ORDER — INSULIN ASPART 100 UNIT/ML IJ SOLN: 0.0000 [IU] | INTRAMUSCULAR | Status: DC | PRN

## 2022-12-15 MED ORDER — VANCOMYCIN HCL 1000 MG IV SOLR
INTRAVENOUS | Status: AC
  Filled 2022-12-15: qty 20

## 2022-12-15 MED ORDER — LIDOCAINE 2% (20 MG/ML) 5 ML SYRINGE
INTRAMUSCULAR | Status: DC | PRN
  Administered 2022-12-15: 100 mg via INTRAVENOUS

## 2022-12-15 MED ORDER — ACETAMINOPHEN 10 MG/ML IV SOLN: 1000.0000 mg | Freq: Once | INTRAVENOUS | Status: DC | PRN

## 2022-12-15 MED ORDER — PROPOFOL 10 MG/ML IV BOLUS
INTRAVENOUS | Status: DC | PRN
  Administered 2022-12-15: 100 mg via INTRAVENOUS

## 2022-12-15 MED ORDER — CHLORHEXIDINE GLUCONATE 0.12 % MT SOLN
15.0000 mL | Freq: Once | OROMUCOSAL | Status: AC
Start: 2022-12-15 — End: 2022-12-15
  Administered 2022-12-15: 15 mL via OROMUCOSAL
  Filled 2022-12-15: qty 15

## 2022-12-15 SURGICAL SUPPLY — 42 items
BAG COUNTER SPONGE SURGICOUNT (BAG) ×1 IMPLANT
CANISTER SUCT 3000ML PPV (MISCELLANEOUS) ×1 IMPLANT
CONN PLUG DBS SENSIGHT (Connector) ×1 IMPLANT
CONNECTOR PLUG DBS SENSIGHT (Connector) IMPLANT
DERMABOND ADVANCED .7 DNX12 (GAUZE/BANDAGES/DRESSINGS) ×1 IMPLANT
DRAPE CAMERA VIDEO/LASER (DRAPES) ×1 IMPLANT
DRAPE LAPAROTOMY 100X72 PEDS (DRAPES) ×1 IMPLANT
DRSG AQUACEL ADVANTAGE 4X5 (GAUZE/BANDAGES/DRESSINGS) ×1 IMPLANT
DRSG OPSITE POSTOP 4X6 (GAUZE/BANDAGES/DRESSINGS) ×1 IMPLANT
DURAPREP 26ML APPLICATOR (WOUND CARE) ×1 IMPLANT
ELECT COATED BLADE 2.86 ST (ELECTRODE) ×1 IMPLANT
GAUZE 4X4 16PLY ~~LOC~~+RFID DBL (SPONGE) IMPLANT
GLOVE BIO SURGEON STRL SZ7 (GLOVE) ×1 IMPLANT
GLOVE BIOGEL PI IND STRL 7.5 (GLOVE) ×1 IMPLANT
GLOVE BIOGEL PI IND STRL 8 (GLOVE) ×1 IMPLANT
GLOVE ECLIPSE 8.0 STRL XLNG CF (GLOVE) ×1 IMPLANT
GLOVE EXAM NITRILE XL STR (GLOVE) IMPLANT
GOWN STRL REUS W/ TWL LRG LVL3 (GOWN DISPOSABLE) IMPLANT
GOWN STRL REUS W/ TWL XL LVL3 (GOWN DISPOSABLE) ×2 IMPLANT
GOWN STRL REUS W/TWL 2XL LVL3 (GOWN DISPOSABLE) ×1 IMPLANT
KIT BASIN OR (CUSTOM PROCEDURE TRAY) ×1 IMPLANT
KIT REMOVER STAPLE SKIN (MISCELLANEOUS) ×1 IMPLANT
KIT TURNOVER KIT B (KITS) ×1 IMPLANT
MARKER SKIN DUAL TIP RULER LAB (MISCELLANEOUS) ×1 IMPLANT
NDL HYPO 25X1 1.5 SAFETY (NEEDLE) ×1 IMPLANT
NEEDLE HYPO 25X1 1.5 SAFETY (NEEDLE) ×1 IMPLANT
NEUROSTIM DBS PERCEPT PC (Neurostimulator) IMPLANT
NEUROSTIMULATOR DBS PERCEPT PC (Neurostimulator) ×1 IMPLANT
NS IRRIG 1000ML POUR BTL (IV SOLUTION) ×1 IMPLANT
PACK LAMINECTOMY NEURO (CUSTOM PROCEDURE TRAY) ×1 IMPLANT
PAD ARMBOARD 7.5X6 YLW CONV (MISCELLANEOUS) ×3 IMPLANT
PASSER CATH 36 CODMAN DISP (NEUROSURGERY SUPPLIES) IMPLANT
PASSER CATH SHUNT 55CM (INSTRUMENTS) IMPLANT
POWDER SURGICEL 3.0 GRAM (HEMOSTASIS) ×1 IMPLANT
SPIKE FLUID TRANSFER (MISCELLANEOUS) ×1 IMPLANT
SUT MNCRL AB 3-0 PS2 18 (SUTURE) IMPLANT
SUT PROLENE 3 0 PS 1 (SUTURE) IMPLANT
SUT VIC AB 2-0 CP2 18 (SUTURE) ×1 IMPLANT
SUT VIC AB 3-0 SH 8-18 (SUTURE) ×1 IMPLANT
TOWEL GREEN STERILE (TOWEL DISPOSABLE) ×1 IMPLANT
TOWEL GREEN STERILE FF (TOWEL DISPOSABLE) ×1 IMPLANT
WATER STERILE IRR 1000ML POUR (IV SOLUTION) ×1 IMPLANT

## 2022-12-15 NOTE — Progress Notes (Signed)
Medtronic pacemaker rep notified of patient in pre-op. Rep at bedside to turn off tachytherapies. Pt placed on monitor and Zoll monitor.

## 2022-12-15 NOTE — Anesthesia Postprocedure Evaluation (Signed)
Anesthesia Post Note  Patient: Henry [name]  Procedure(s) Performed: IMPLANTABLE PULSE GENERATOR  BATTERY REPLACEMENT (Right)     Patient location during evaluation: PACU Anesthesia Type: General Level of consciousness: awake and alert Pain management: pain level controlled Vital Signs Assessment: post-procedure vital signs reviewed and stable Respiratory status: spontaneous breathing, nonlabored ventilation, respiratory function stable and patient connected to nasal cannula oxygen Cardiovascular status: blood pressure returned to baseline and stable Postop Assessment: no apparent nausea or vomiting Anesthetic complications: no  No notable events documented.  Last Vitals:  Vitals:   12/15/22 1015 12/15/22 1030  BP: 110/69 114/67  Pulse: 67 69  Resp: 20 19  Temp:    SpO2: 95% 94%    Last Pain:  Vitals:   12/15/22 1030  TempSrc:   PainSc: 0-No pain                 Maury Bamba S

## 2022-12-15 NOTE — Op Note (Signed)
   Providing Compassionate, Quality Care - Together  Date of service: 12/15/2022  PREOP DIAGNOSIS:  Right chest IPG end of battery life  POSTOP DIAGNOSIS: Same  PROCEDURE: Right chest DBS IPG battery change, Medtronic  SURGEON: Dr. Kendell Bane C. Gareld Obrecht, DO  ASSISTANT: None  ANESTHESIA: General Endotracheal  EBL: Minimal  SPECIMENS: None   DRAINS: None   COMPLICATIONS: None  CONDITION: Hemodynamically stable  HISTORY: Henry [name] is a 72 y.o. male with bilateral DBS, connected to a right chest IPG.  He presented with end-of-life for his right chest IPG and therefore offered battery replacement.  We discussed all risks, benefits and expected outcomes as well as alternatives to treatment.  His device is Medtronic.  Informed consent was obtained and witnessed.  PROCEDURE IN DETAIL: The patient was brought to the operating room. After induction of general anesthesia, the patient was positioned on the operative table in the supine position. All pressure points were meticulously padded. Skin incision was then marked out and prepped and draped in the usual sterile fashion. Physician driven timeout was performed.  Local anesthetic was injected into the planned incision.  Using 15 blade, incision was made sharply over the prior IPG over the right chest.  Using Bovie electrocautery, dissection was performed down to the IPG and wire.  The wire was identified and protected.  Of note the wire was superficial to the battery.  The IPG was circumferentially disconnected from soft tissue with Bovie electrocautery and removed from the pocket without difficulty.  The individual wire was removed from the IPG and placed in a new IPG.  This was placed in the pocket.  Impedances were checked and noted to be appropriate with no changes.  This was placed in its original position in the pocket.  I could not mobilize all of the wires to then be tucked deep to the pocket therefore they did remain somewhat  superficial to the IPG.  The wound was copiously irrigated and noted be excellently hemostatic.  Vancomycin powder was placed in the wound.  The wound was then closed in layers, 2-0 Vicryl suture for the pocket layer.  Dermis was closed with 2-0 and 3-0 Vicryl suture.  Skin was closed with skin glue.  Sterile dressing was applied.  At the end of the case all sponge, needle, and instrument counts were correct. The patient was then transferred to the stretcher, extubated, and taken to the post-anesthesia care unit in stable hemodynamic condition.

## 2022-12-15 NOTE — Progress Notes (Signed)
Pt with insulin pump. Wife states that she did not reduce his bolus rate d/t his blood sugar being in the 200s last night. Patient did not bring insulin pump controller. CBG 123 on arrival to short stay. Dr Okey Dupre notified. Pump removed per Dr Okey Dupre and patient ordered sensitive scale insulin for surgery. Pt CBG to be checked again prior to going to surgery per order.

## 2022-12-15 NOTE — Anesthesia Procedure Notes (Signed)
Procedure Name: Intubation Date/Time: 12/15/2022 8:45 AM  Performed by: Loleta Weldon Nouri, CRNAPre-anesthesia Checklist: Patient identified, Patient being monitored, Timeout performed, Emergency Drugs available and Suction available Patient Re-evaluated:Patient Re-evaluated prior to induction Oxygen Delivery Method: Circle system utilized Preoxygenation: Pre-oxygenation with 100% oxygen Induction Type: IV induction Ventilation: Mask ventilation without difficulty Laryngoscope Size: Mac and 4 Grade View: Grade I Tube type: Oral Tube size: 7.5 mm Number of attempts: 1 Airway Equipment and Method: Stylet Placement Confirmation: ETT inserted through vocal cords under direct vision, positive ETCO2 and breath sounds checked- equal and bilateral Secured at: 23 cm Tube secured with: Tape Dental Injury: Teeth and Oropharynx as per pre-operative assessment

## 2022-12-15 NOTE — Transfer of Care (Addendum)
Immediate Anesthesia Transfer of Care Note  Patient: Henry [name]  Procedure(s) Performed: IMPLANTABLE PULSE GENERATOR  BATTERY REPLACEMENT (Right)  Patient Location: PACU  Anesthesia Type:General  Level of Consciousness: awake  Airway & Oxygen Therapy: Patient Spontanous Breathing  Post-op Assessment: Report given to RN, Post -op Vital signs reviewed and stable, and Patient moving all extremities X 4. Right facial droop and slurred speech baseline as endorsed by surgeon.  Post vital signs: Reviewed and stable  Last Vitals:  Vitals Value Taken Time  BP 131/81 12/15/22 0952  Temp    Pulse 67 12/15/22 0959  Resp 16 12/15/22 0959  SpO2 93 % 12/15/22 0959  Vitals shown include unfiled device data.  Last Pain:  Vitals:   12/15/22 0702  TempSrc:   PainSc: 0-No pain         Complications: No notable events documented.

## 2022-12-15 NOTE — Addendum Note (Signed)
Addendum  created 12/15/22 1109 by Loleta Faruq Rosenberger, CRNA   Clinical Note Signed

## 2022-12-15 NOTE — H&P (Signed)
Providing Compassionate, Quality Care - Together  NEUROSURGERY HISTORY & PHYSICAL   Henry [name] is an 72 y.o. male.   Chief Complaint: IPG End of battery life HPI: This is a 72 yo M with BL DBS here with end of battery life at right chest IPG.   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   Dysrhythmia    a-fib   GERD (gastroesophageal reflux disease)    Glaucoma    right eye   History of kidney stones    multiple   HLD (hyperlipidemia)    HTN (hypertension)    pt denies 08/19/12   Hyperplasia, prostate    Hypothyroidism    MI (myocardial infarction) (HCC)    Dr Jens Som 2000, [id number] bypass   Neuromuscular disorder (HCC)    Parkinson's Disease   OSA (obstructive sleep apnea)    AHI-28,on CPAP, noncompliant with CPAP   Parkinson disease (HCC)    1999   PONV (postoperative nausea and vomiting)    Restless legs    Shortness of breath    Hx: of at all times   UTI (lower urinary tract infection) 09/15/2012   Klebsiella   Ventral hernia    Walker as ambulation aid    also uses wheelchair at home/when going out    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   BIOPSY  10/14/2018   Procedure: BIOPSY;  Surgeon: Beverley Fiedler, MD;  Location: [facility];  Service: Gastroenterology;;   CATARACT EXTRACTION W/ INTRAOCULAR LENS IMPLANT     Hx: of right eye   CATARACT EXTRACTION W/ INTRAOCULAR LENS IMPLANT Left 2018   COLONOSCOPY N/A 10/13/2014   Procedure: COLONOSCOPY;  Surgeon: Beverley Fiedler, MD;   Location: [facility];  Service: Gastroenterology;  Laterality: N/A;   COLONOSCOPY W/ BIOPSIES AND POLYPECTOMY     Hx: of   COLONOSCOPY WITH PROPOFOL N/A 10/14/2018   Procedure: COLONOSCOPY WITH PROPOFOL;  Surgeon: Beverley Fiedler, MD;  Location: [facility];  Service: Gastroenterology;  Laterality: N/A;   CORONARY ARTERY BYPASS GRAFT  2000   Tressie Stalker, MD   CORONARY STENT PLACEMENT  1998   DEEP BRAIN STIMULATOR PLACEMENT  2004   Right and left VIN stimulator placement (parkinsons)   EYE SURGERY     Cataract removed in 1980s related to wood in eye when 72 years old   FINGER AMPUTATION     left pointer   IMPLANTABLE CARDIOVERTER DEFIBRILLATOR IMPLANT N/A 11/13/2013   Procedure: IMPLANTABLE CARDIOVERTER DEFIBRILLATOR IMPLANT;  Surgeon: Marinus Maw, MD;  Location: [facility] CATH LAB;  Service: Cardiovascular;  Laterality: N/A;   INSERT / REPLACE / REMOVE PACEMAKER     medtronic   LEFT AND RIGHT HEART CATHETERIZATION WITH CORONARY ANGIOGRAM N/A 09/24/2013   Procedure: LEFT AND RIGHT HEART CATHETERIZATION WITH CORONARY ANGIOGRAM;  Surgeon: Kathleene Hazel, MD;  Location: [facility] CATH LAB;  Service: Cardiovascular;  Laterality: N/A;   LITHOTRIPSY     3 different times   POLYPECTOMY  10/14/2018   Procedure: POLYPECTOMY;  Surgeon: Beverley Fiedler, MD;  Location: [facility];  Service: Gastroenterology;;   PULSE GENERATOR IMPLANT Right 11/13/2017   Procedure: Right chest implantable pulse generator change;  Surgeon: Maeola Harman, MD;  Location: [facility] OR;  Service: Neurosurgery;  Laterality: Right;  Right chest implantable pulse generator change   SUBTHALAMIC STIMULATOR BATTERY REPLACEMENT N/A 09/05/2012   Procedure: Deep brain stimulator battery change;  Surgeon: Maeola Harman, MD;  Location: [facility];  Service: Neurosurgery;  Laterality: N/A;  Deep brain stimulator battery change   SUBTHALAMIC STIMULATOR BATTERY REPLACEMENT N/A 06/10/2015   Procedure: Deep Brain stimulator battery change;   Surgeon: Maeola Harman, MD;  Location: [facility];  Service: Neurosurgery;  Laterality: N/A;   SUBTHALAMIC STIMULATOR BATTERY REPLACEMENT Right 09/25/2019   Procedure: Deep brain stimulator battery change;  Surgeon: Maeola Harman, MD;  Location: [facility];  Service: Neurosurgery;  Laterality: Right;   TONSILLECTOMY      Family History  Problem Relation Age of Onset   Aneurysm Mother    Alcoholism Father    HIV/AIDS Brother 79       AIDS   Healthy Sister    Healthy Child    Peripheral vascular disease Other    Arthritis Other    Healthy Sister    Healthy Child    Healthy Child    Diabetes Neg Hx    Heart disease Neg Hx    Colon cancer Neg Hx    Prostate cancer Neg Hx    Social History:  reports that he has never smoked. He has never been exposed to tobacco smoke. He has never used smokeless tobacco. He reports that he does not currently use alcohol. He reports that he does not use drugs.  Allergies:  Allergies  Allergen Reactions   Penicillins Anaphylaxis and Rash    Because of a history of documented adverse serious drug reaction;Medi Alert bracelet  is recommended PATIENT HAS HAD A PCN REACTION WITH IMMEDIATE RASH, FACIAL/TONGUE/THROAT SWELLING, SOB, OR LIGHTHEADEDNESS WITH HYPOTENSION:  #  #  YES  #  #  Has patient had a PCN reaction causing severe rash involving mucus membranes or skin necrosis: unknown Has patient had a PCN reaction that required hospitalization NO Has patient had a PCN reaction occurring within the last 10 years: NO   Klonopin [Clonazepam] Other (See Comments)    agitation   Peanut-Containing Drug Products Cough   Watermelon [Citrullus Vulgaris] Other (See Comments) and Cough    Tickle in throat    Medications Prior to Admission  Medication Sig Dispense Refill   acetaminophen (TYLENOL) 500 MG tablet Take 1,000 mg by mouth at bedtime as needed for mild pain or headache.      albuterol (VENTOLIN HFA) 108 (90 Base) MCG/ACT inhaler Inhale 2 puffs into the  lungs every 6 (six) hours as needed for wheezing or shortness of breath. 18 g 5   allopurinol (ZYLOPRIM) 100 MG tablet TAKE 1 AND 1/2 TABLETS BY MOUTH DAILY 135 tablet 3   aspirin EC 81 MG tablet Take 1 tablet (81 mg total) by mouth daily. Swallow whole. 90 tablet 3   Carbidopa-Levodopa ER (SINEMET CR) 25-100 MG tablet controlled release TAKE 2 TABLETS BY MOUTH 5 TIMES PER DAY AS DIRECTED 900 tablet 0   Cholecalciferol (VITAMIN D) 50 MCG (2000 UT) tablet Take 2,000 Units by mouth daily.  dorzolamide-timolol (COSOPT) 22.3-6.8 MG/ML ophthalmic solution Place 1 drop into the right eye 2 (two) times daily.     DULoxetine (CYMBALTA) 30 MG capsule TAKE 1 CAPSULE BY MOUTH EVERY DAY 90 capsule 0   fluticasone-salmeterol (ADVAIR) 250-50 MCG/ACT AEPB Inhale 1 puff into the lungs in the morning and at bedtime. Rinse after use. 180 each 3   furosemide (LASIX) 20 MG tablet Take 20 mg tablet and 1/2 of the 80 mg tablet once daily to equal 60 mg daily 90 tablet 3   furosemide (LASIX) 80 MG tablet TAKE 1 TABLET BY MOUTH EVERY DAY (Patient taking differently: Take 40 mg by mouth daily.) 90 tablet 1   icosapent Ethyl (VASCEPA) 1 g capsule Take 2 capsules (2 g total) by mouth 2 (two) times daily. 120 capsule 1   insulin lispro (HUMALOG) 100 UNIT/ML injection USE MAXIMUM 76 UNITS PER   DAY WITH V-GO PUMP 60 mL 3   KLOR-CON M20 20 MEQ tablet TAKE 3 TABLETS (60 MEQ) BY MOUTH DAILY. (Patient taking differently: Take 20-40 mEq by mouth See admin instructions. Take 40 mEq by mouth in the morning and take 20 mEq in the evening) 270 tablet 1   levothyroxine (SYNTHROID) 150 MCG tablet Take 1 tablet (150 mcg total) by mouth daily before breakfast. 90 tablet 3   losartan (COZAAR) 25 MG tablet TAKE 1 TABLET (25 MG TOTAL) BY MOUTH DAILY. PLEASE KEEP SCHEDULED APPOINTMENT 90 tablet 2   metoprolol succinate (TOPROL-XL) 50 MG 24 hr tablet TAKE 1 TABLET BY MOUTH EVERY DAY WITH OR IMMEDIATELY FOLLOWING A MEAL 90 tablet 1    nitroGLYCERIN (NITROSTAT) 0.4 MG SL tablet Place 1 tablet (0.4 mg total) under the tongue every 5 (five) minutes as needed for chest pain. 25 tablet 12   NUPLAZID 34 MG CAPS TAKE 1 CAPSULE BY MOUTH 1 TIME A DAY 90 capsule 0   OVER THE COUNTER MEDICATION Apply 1 application topically daily as needed (pain). Theraworx Pain Cream     polyethylene glycol (MIRALAX / GLYCOLAX) packet Take 8.5 g by mouth daily.     rosuvastatin (CRESTOR) 20 MG tablet TAKE 1 TAB BY MOUTH EVERYDAY AT BEDTIME 90 tablet 1   spironolactone (ALDACTONE) 25 MG tablet Take 0.5 tablets (12.5 mg total) by mouth daily. 45 tablet 3   tirzepatide (MOUNJARO) 7.5 MG/0.5ML Pen Inject 7.5 mg into the skin once a week. 6 mL 2   traMADol (ULTRAM) 50 MG tablet Take 1 tablet (50 mg total) by mouth every 12 (twelve) hours as needed. (Patient taking differently: Take 50 mg by mouth in the morning and at bedtime.) 60 tablet 2   vitamin B-12 (CYANOCOBALAMIN) 1000 MCG tablet Take 1,000 mcg by mouth daily.     clotrimazole (LOTRIMIN) 1 % cream Apply 1 Application topically 2 (two) times daily. 30 g 0   colchicine 0.6 MG tablet Take 1 tablet (0.6 mg total) by mouth daily as needed (gout).     Continuous Blood Gluc Receiver (DEXCOM G6 RECEIVER) DEVI Use to check blood sugar daily 1 each 0   Continuous Glucose Sensor (DEXCOM G6 SENSOR) MISC Use to check blood sugar daily 9 each 3   Continuous Glucose Transmitter (DEXCOM G6 TRANSMITTER) MISC Change every 3 months 1 each 3   FREESTYLE LITE test strip USE TO CHECK BLOOD SUGAR 4 TIMES DAILY. 150 strip 3   Insulin Disposable Pump (OMNIPOD 5 G6 INTRO, GEN 5,) KIT 1 kit by Does not apply route daily. 1 kit 0  Insulin Disposable Pump (OMNIPOD 5 G6 PODS, GEN 5,) MISC CHANGE POD EVERY 3 DAYS 30 each 3   Pimavanserin Tartrate (NUPLAZID) 34 MG CAPS Samples of this drug were given to the patient, quantity 5, Lot Number [id number] Exp 05/2022 30 capsule 0   triamcinolone cream (KENALOG) 0.1 % Apply 1 Application  topically daily. Apply to rash 15 g 0    Results for orders placed or performed during the hospital encounter of 12/15/22 (from the past 48 hour(s))  Glucose, capillary     Status: Abnormal   Collection Time: 12/15/22  6:36 AM  Result Value Ref Range   Glucose-Capillary 123 (H) 70 - 99 mg/dL    Comment: Glucose reference range applies only to samples taken after fasting for at least 8 hours.   *Note: Due to a large number of results and/or encounters for the requested time period, some results have not been displayed. A complete set of results can be found in Results Review.   No results found.  ROS All pertinent +/- above  Blood pressure (!) 130/93, pulse 70, temperature 97.6 F (36.4 C), temperature source Oral, resp. rate 18, height 5\' 9"  (1.753 m), weight 118.8 kg, SpO2 96%. Physical Exam  AAOx3 Speech slurred, slow at baseline PERRL MAE equally SILT NAD NLD  Assessment/Plan 72 yo M with:  End of battery life, right chest IPG  -OR today for right chest IPG replacement, all risks, benefits and outcomes discussed and agreed upon.   Thank you for allowing me to participate in this patient's care.  Please do not hesitate to call with questions or concerns.   Monia Pouch, DO Neurosurgeon [facility] [phone]

## 2022-12-21 ENCOUNTER — Other Ambulatory Visit: Payer: Self-pay | Admitting: Neurology

## 2022-12-21 ENCOUNTER — Other Ambulatory Visit: Payer: Self-pay | Admitting: Family Medicine

## 2022-12-22 ENCOUNTER — Other Ambulatory Visit: Payer: Self-pay

## 2023-01-05 ENCOUNTER — Encounter: Payer: Self-pay | Admitting: Physical Medicine & Rehabilitation

## 2023-01-05 ENCOUNTER — Encounter: Payer: Medicare Other | Attending: Physical Medicine & Rehabilitation | Admitting: Physical Medicine & Rehabilitation

## 2023-01-05 VITALS — BP 133/79 | HR 69 | Ht 69.0 in | Wt 262.0 lb

## 2023-01-05 DIAGNOSIS — G248 Other dystonia: Secondary | ICD-10-CM | POA: Insufficient documentation

## 2023-01-05 MED ORDER — ONABOTULINUMTOXINA 100 UNITS IJ SOLR
100.0000 [IU] | Freq: Once | INTRAMUSCULAR | Status: AC
  Administered 2023-01-05: 300 [IU] via INTRAMUSCULAR

## 2023-01-05 NOTE — Progress Notes (Signed)
Botox Injection for spasticity using ultrasound guidance  Dilution: 50 units/ml Indication: Severe spasticity which interferes with ADL,mobility and/or  hygiene and is unresponsive to medication management and other conservative care Informed consent was obtained after describing risks and benefits of the procedure with the patient. This includes bleeding, bruising, infection, excessive weakness, or medication side effects. A REMS form is on file and signed. Needle: 22 g 80mm ECHO bloc needle Number of units per muscle Right tibialis posterior 75 units Right peroneus longus 75 units Right gluteus medius 75 units Right Piriformis 75 All injections were done under direct ultrasound guidance, 4 Hz curvilinear transducer using ECHO block needle long axis view, the patient tolerated the procedure well. Post procedure instructions were given. A followup appointment was made.

## 2023-01-09 ENCOUNTER — Other Ambulatory Visit: Payer: Self-pay | Admitting: Neurology

## 2023-01-09 DIAGNOSIS — R443 Hallucinations, unspecified: Secondary | ICD-10-CM

## 2023-01-09 DIAGNOSIS — G20A1 Parkinson's disease without dyskinesia, without mention of fluctuations: Secondary | ICD-10-CM

## 2023-01-11 ENCOUNTER — Other Ambulatory Visit: Payer: Self-pay | Admitting: Family Medicine

## 2023-01-11 NOTE — Progress Notes (Signed)
 Assessment/Plan:   1.  Parkinsons Disease             -Status post bilateral DBS STN surgery             -Battery changes occurring September 05, 2012; June 10, 2015; November 13, 2017; 09/25/19; 12/15/22.  His battery site looks great today.  No erythema, crepitance, drainage.  It is just a bit bigger than last IPG site.  They asked me if they need to follow back up with Dr. Leia office.  They saw his PA about 2 weeks ago and have another appointment later this week.  They are happy to do so, but do not live close.  I texted Dr. Carollee to find out if he needs them to follow-up.             -Take carbidopa /levodopa  25/100 CR, currently taking 2 tablets up to 5 times per day.  He has been waking up in the middle of the night to take this, thinking it would be better to keep it in the system.  I told him he did not need to do this  -We discussed Vyalev again, which is foscarbidopa/foslevodopa pump that is newly FDA approved.  We discussed that it is for motor fluctuations in adults with advanced Parkinson's disease.  We discussed that this likely will not be on Medicare formulary until the latter half of 2025.  We discussed risks and benefits of this drug.  2.  RBD             -He has tried clonazepam  several times and ultimately ends up stopping it because of side effect.  Could try Rozerem in the future if insurance would pay.  Decided to hold off for now as the patient states that while dreaming is vivid, it is not particularly bothersome and he is not falling out of the bed.  Discussed bedroom safety.  He is actually doing much better now but on Nuplazid . 3.  OSAS, noncompliant with CPAP.             -Patient aware of risks/morbidity and mortality associated with untreated sleep apnea 4.  CHF             -ICD placed on 11/15/2013  -DBS will need placed into special mode if pt ever requires cardioversion 5.  Facial paresthesias             -Has been going on for few years and became bilateral,  not c/w TN.  He has had several negative noncontrast CTs of the brain.  Kidney function is not good enough for contrasted CT.  His DBS device is not MRI compatible.             -I am unsure what this is, and offered to try carbamazepine, but he really did not want that.             -I have turned off his DBS and he noticed no change in the facial paresthesias. 6.  Dysphagia  -Patient last modified barium swallow was March 30, 2021.  Patient with mild oral phase dysphagia and mild to moderate pharyngeal phase dysphagia and also the appearance of esophageal phase dysphagia.  The speech therapist recommended for his regular diet, but if he has more coughing or other dysphagia symptoms, to consider nectar thick liquids.  -It should be noted that Parkinson's disease does not cause esophageal phase dysphagia and this may need to be evaluated by gastroenterology.  It could certainly be responsible for the other phases of dysphagia.  7.  Myoclonus  -off of gabapentin  now and doing well in that regard  -on cymbalta  30 mg for PN 8.  Hallucinations  -Markedly improved on Nuplazid , 34 mg daily.  He was to wake up and have trouble differentiating dreams from reality, but otherwise seems to do fairly well. 9.  Paroxysmal A-fib  -Not on anticoagulation because of his history of falls. 10.  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 Carilyn and doing Botox .  He did not find the initial injections helpful, but has recently repeated injections on December 27.  His foot looks better to me!  Subjective:   Henry [name] was seen today in follow up for Parkinsons disease.  My previous records were reviewed prior to todays visit as well as outside records available to me.  Patient with wife who supplements history.  Patient had his IPG changed on December 6.  He did well with this.  He also saw Dr. Carilyn December 27 and they did another round of Botox  injections.  He hasn't fallen since our last visit.   No hallucinations when wide awake but awakens from sleep/dreams and may have some difficulty telling dream from reality.    Current prescribed movement disorder medications: Carbidopa /levodopa  25/100 CR, 2 tablets at 8 AM/noon/4pm/8pm/midnight (this is how he is dosing this now) Carbidopa /levodopa  50/200 CR at bedtime (he's not taking that) Cymbalta , 30 mg daily Nuplazid , 34 mg daily   Prior medications: Gabapentin  (myoclonus);    ALLERGIES:   Allergies  Allergen Reactions   Penicillins Anaphylaxis and Rash    Because of a history of documented adverse serious drug reaction;Medi Alert bracelet  is recommended PATIENT HAS HAD A PCN REACTION WITH IMMEDIATE RASH, FACIAL/TONGUE/THROAT SWELLING, SOB, OR LIGHTHEADEDNESS WITH HYPOTENSION:  #  #  YES  #  #  Has patient had a PCN reaction causing severe rash involving mucus membranes or skin necrosis: unknown Has patient had a PCN reaction that required hospitalization NO Has patient had a PCN reaction occurring within the last 10 years: NO   Klonopin  [Clonazepam ] Other (See Comments)    agitation   Peanut-Containing Drug Products Cough   Watermelon [Citrullus Vulgaris] Other (See Comments) and Cough    Tickle in throat    CURRENT MEDICATIONS:  Outpatient Encounter Medications as of 01/15/2023  Medication Sig   acetaminophen  (TYLENOL ) 500 MG tablet Take 1,000 mg by mouth at bedtime as needed for mild pain or headache.    albuterol  (VENTOLIN  HFA) 108 (90 Base) MCG/ACT inhaler TAKE 2 PUFFS BY MOUTH EVERY 6 HOURS AS NEEDED FOR WHEEZE OR SHORTNESS OF BREATH   allopurinol  (ZYLOPRIM ) 100 MG tablet TAKE 1 AND 1/2 TABLETS BY MOUTH DAILY   aspirin  EC 81 MG tablet Take 1 tablet (81 mg total) by mouth daily. Swallow whole.   Carbidopa -Levodopa  ER (SINEMET  CR) 25-100 MG tablet controlled release TAKE 2 TABLETS BY MOUTH 5 TIMES PER DAY AS DIRECTED   Cholecalciferol  (VITAMIN D ) 50 MCG (2000 UT) tablet Take 2,000 Units by mouth daily.    clotrimazole   (LOTRIMIN ) 1 % cream Apply 1 Application topically 2 (two) times daily.   colchicine  0.6 MG tablet Take 1 tablet (0.6 mg total) by mouth daily as needed (gout).   Continuous Blood Gluc Receiver (DEXCOM G6 RECEIVER) DEVI Use to check blood sugar daily   Continuous Glucose Sensor (DEXCOM G6 SENSOR) MISC Use to check blood sugar daily   Continuous  Glucose Transmitter (DEXCOM G6 TRANSMITTER) MISC Change every 3 months   dorzolamide -timolol  (COSOPT ) 22.3-6.8 MG/ML ophthalmic solution Place 1 drop into the right eye 2 (two) times daily.   DULoxetine  (CYMBALTA ) 30 MG capsule TAKE 1 CAPSULE BY MOUTH EVERY DAY   fluticasone -salmeterol (WIXELA INHUB) 250-50 MCG/ACT AEPB INHALE 1 PUFF INTO THE LUNGS IN THE MORNING AND AT BEDTIME**RINSE AFTER USE**   FREESTYLE LITE test strip USE TO CHECK BLOOD SUGAR 4 TIMES DAILY.   furosemide  (LASIX ) 20 MG tablet Take 20 mg tablet and 1/2 of the 80 mg tablet once daily to equal 60 mg daily   furosemide  (LASIX ) 80 MG tablet TAKE 1 TABLET BY MOUTH EVERY DAY (Patient taking differently: Take 40 mg by mouth daily.)   icosapent  Ethyl (VASCEPA ) 1 g capsule Take 2 capsules (2 g total) by mouth 2 (two) times daily.   Insulin  Disposable Pump (OMNIPOD 5 G6 INTRO, GEN 5,) KIT 1 kit by Does not apply route daily.   Insulin  Disposable Pump (OMNIPOD 5 G6 PODS, GEN 5,) MISC CHANGE POD EVERY 3 DAYS   insulin  lispro (HUMALOG ) 100 UNIT/ML injection USE MAXIMUM 76 UNITS PER   DAY WITH V-GO PUMP   KLOR-CON  M20 20 MEQ tablet TAKE 3 TABLETS (60 MEQ) BY MOUTH DAILY. (Patient taking differently: Take 20-40 mEq by mouth See admin instructions. Take 40 mEq by mouth in the morning and take 20 mEq in the evening)   levothyroxine  (SYNTHROID ) 150 MCG tablet Take 1 tablet (150 mcg total) by mouth daily before breakfast.   losartan  (COZAAR ) 25 MG tablet Take 1 tablet (25 mg total) by mouth daily.   metoprolol  succinate (TOPROL -XL) 50 MG 24 hr tablet TAKE 1 TABLET BY MOUTH EVERY DAY WITH OR IMMEDIATELY  FOLLOWING A MEAL   NUPLAZID  34 MG CAPS TAKE 1 CAPSULE BY MOUTH 1 TIME A DAY   OVER THE COUNTER MEDICATION Apply 1 application topically daily as needed (pain). Theraworx Pain Cream   Pimavanserin  Tartrate (NUPLAZID ) 34 MG CAPS Samples of this drug were given to the patient, quantity 5, Lot Number [id number] Exp 05/2022   polyethylene glycol (MIRALAX  / GLYCOLAX ) packet Take 8.5 g by mouth daily.   rosuvastatin  (CRESTOR ) 20 MG tablet TAKE 1 TAB BY MOUTH EVERYDAY AT BEDTIME   tirzepatide  (MOUNJARO ) 7.5 MG/0.5ML Pen Inject 7.5 mg into the skin once a week.   traMADol  (ULTRAM ) 50 MG tablet TAKE 1 TABLET BY MOUTH EVERY 12 HOURS AS NEEDED.   triamcinolone  cream (KENALOG ) 0.1 % Apply 1 Application topically daily. Apply to rash   vitamin B-12 (CYANOCOBALAMIN ) 1000 MCG tablet Take 1,000 mcg by mouth daily.   nitroGLYCERIN  (NITROSTAT ) 0.4 MG SL tablet Place 1 tablet (0.4 mg total) under the tongue every 5 (five) minutes as needed for chest pain.   spironolactone  (ALDACTONE ) 25 MG tablet Take 0.5 tablets (12.5 mg total) by mouth daily.   [DISCONTINUED] traMADol  (ULTRAM ) 50 MG tablet Take 1 tablet (50 mg total) by mouth every 12 (twelve) hours as needed. (Patient taking differently: Take 50 mg by mouth in the morning and at bedtime.)   No facility-administered encounter medications on file as of 01/15/2023.    Objective:   PHYSICAL EXAMINATION:    VITALS:   Vitals:   01/15/23 1304  BP: 132/78  Pulse: 71  SpO2: 95%  Weight: 264 lb (119.7 kg)      GEN:  The patient appears stated age and is in NAD. HEENT:  Normocephalic, atraumatic.  The mucous membranes are moist. The superficial  temporal arteries are without ropiness or tenderness. Cardiovascular: Regular rate rhythm Lungs: Clear to auscultation bilaterally  Neurological examination:  Orientation: The patient is alert and oriented to person, place, and time.  He asks appropriate questions and is interactive. Cranial nerves: There is good  facial symmetry with facial hypomimia. The speech is fluent and dysarthric (baseline) he is hypophonic. Soft palate rises symmetrically and there is no tongue deviation. Hearing is intact to conversational tone. Sensation: Sensation is intact to light touch throughout Motor: Strength is at least antigravity x4.  Movement examination: Tone: There is normal tone in the upper and lower extremities Abnormal movements: Rare rest tremor on the right. Coordination:  There is slowness with foot taps bilaterally, L>R Gait and Station: Patient had his U step in the office today.  He was flexed at the waist, but actually ambulated quite well today and did fairly well in the turns.  The right foot and ankle does evert, but actually minimally so, and was much better than previous.    I have reviewed and interpreted the following labs independently    Chemistry      Component Value Date/Time   NA 137 12/11/2022 1358   NA 141 08/16/2022 1115   K 4.1 12/11/2022 1358   CL 99 12/11/2022 1358   CO2 27 12/11/2022 1358   BUN 20 12/11/2022 1358   BUN 20 08/16/2022 1115   CREATININE 1.19 12/11/2022 1358   CREATININE 1.37 (H) 07/28/2022 0826      Component Value Date/Time   CALCIUM  9.0 12/11/2022 1358   ALKPHOS 72 05/31/2021 1055   AST 5 (L) 07/28/2022 0826   ALT <3 (L) 07/28/2022 0826   BILITOT 0.5 07/28/2022 0826       Lab Results  Component Value Date   WBC 8.9 12/11/2022   HGB 14.2 12/11/2022   HCT 43.8 12/11/2022   MCV 88.5 12/11/2022   PLT 156 12/11/2022    Lab Results  Component Value Date   TSH 6.36 (H) 10/30/2022     Total time spent on today's visit was 40  minutes, including both face-to-face time and nonface-to-face time.  Time included that spent on review of records (prior notes available to me/labs/imaging if pertinent), discussing treatment and goals, answering patient's questions and coordinating care.  This was independent of his DBS time, on a separate note  Cc:   Cleatus Arlyss RAMAN, MD

## 2023-01-12 NOTE — Telephone Encounter (Signed)
 Last office visit: 03/03/22 Next office visit: nothing scheduled Last refill:  TRAMADOL HCL 50 MG TABLET 10/12/2022 60 tablets 2 refills with a note that states  Not to exceed 3 additional fills before 01/08/2023

## 2023-01-12 NOTE — Telephone Encounter (Signed)
 Sent with request to schedule a visit.

## 2023-01-15 ENCOUNTER — Ambulatory Visit (INDEPENDENT_AMBULATORY_CARE_PROVIDER_SITE_OTHER): Payer: Medicare Other | Admitting: Neurology

## 2023-01-15 ENCOUNTER — Encounter: Payer: Self-pay | Admitting: Neurology

## 2023-01-15 VITALS — BP 132/78 | HR 71 | Wt 264.0 lb

## 2023-01-15 DIAGNOSIS — G4752 REM sleep behavior disorder: Secondary | ICD-10-CM | POA: Diagnosis not present

## 2023-01-15 DIAGNOSIS — G20A2 Parkinson's disease without dyskinesia, with fluctuations: Secondary | ICD-10-CM | POA: Diagnosis not present

## 2023-01-15 DIAGNOSIS — G20A1 Parkinson's disease without dyskinesia, without mention of fluctuations: Secondary | ICD-10-CM | POA: Diagnosis not present

## 2023-01-15 DIAGNOSIS — G249 Dystonia, unspecified: Secondary | ICD-10-CM | POA: Diagnosis not present

## 2023-01-15 NOTE — Procedures (Signed)
 DBS Programming was performed.    Manufacturer of DBS device: Medtronic  Total time spent programming was 9 minutes.  Device was confirmed to be on.  Soft start was confirmed to be on.  Impedences were checked and were within normal limits.  Battery was checked and was not near end of life    Final settings were as follows with Group B active:   Active Contact Amplitude  PW (ms) Frequency (hz) Side Effects Battery  Left Brain        09/08/19 1-2+ 4.4V 90 170  2.59  10/08/19             Group A 1-2+ 4.3mA 90 170         Group B 0-1-2-C+ 1.1 80 125     0- (1.1)1-(1.0)2-(0.6)       01/19/20             Group B 0-1-2-C+ 1.1 80 125  4 yrs 9 months  05/27/20 Group B 0-1-2-C+ 1.3 80 125  3 years, 1 month  11/30/20 0-1-2-C+ 1.3 80 125  2 years, 5 months  05/31/21 0-1-2-C+ 1.3 80 125 Higher amp with face pull 1 yr, 10 months   12/05/21 0-1-2-C+ 1.4 80 125  1 yr, 3 months  06/06/22 0-1-2-C+ 1.4 80 125    11/13/22 0-1-2-C+ 1.4 80 125  3 months  01/15/23 0-1-2-C+ 1.4 80 125  36yrs, 3 months - 97%          Right Brain        09/08/19 4-7+ 4.5V 90 170    10/08/19             Group A 4-7+ 2.36mA 90 170         Group B 4-7-C+ 2.5 60 125     4-(2.5)7-(0.6)C+       01/19/20             Group B 4-7-C+ 2.8 60 165    05/27/20 group B 4-7-C+ 2.8 60 165    11/30/20 4-7-C+ 2.8 60 165    05/31/21 4-7-C+ 2.9 60 165    12/05/21 4-7-C+ 2.9 60 165    06/06/22 4-7-C+ 2.9 60 165    11/13/22 4-7-C+ 2.9 60 165    01/15/23 4-7-C+ 2.9 60 165

## 2023-01-16 ENCOUNTER — Encounter: Payer: Self-pay | Admitting: Neurology

## 2023-01-17 ENCOUNTER — Ambulatory Visit (INDEPENDENT_AMBULATORY_CARE_PROVIDER_SITE_OTHER): Payer: Medicare Other

## 2023-01-17 ENCOUNTER — Other Ambulatory Visit: Payer: Self-pay | Admitting: Family Medicine

## 2023-01-17 DIAGNOSIS — I255 Ischemic cardiomyopathy: Secondary | ICD-10-CM | POA: Diagnosis not present

## 2023-01-17 DIAGNOSIS — I509 Heart failure, unspecified: Secondary | ICD-10-CM

## 2023-01-17 LAB — CUP PACEART REMOTE DEVICE CHECK
Battery Remaining Longevity: 19 mo
Battery Voltage: 2.91 V
Brady Statistic AP VP Percent: 0 %
Brady Statistic AP VS Percent: 2.57 %
Brady Statistic AS VP Percent: 0.06 %
Brady Statistic AS VS Percent: 97.36 %
Brady Statistic RA Percent Paced: 2.57 %
Brady Statistic RV Percent Paced: 0.07 %
Date Time Interrogation Session: 20250108052825
HighPow Impedance: 75 Ohm
Implantable Lead Connection Status: 753985
Implantable Lead Connection Status: 753985
Implantable Lead Implant Date: 20151105
Implantable Lead Implant Date: 20151105
Implantable Lead Location: 753859
Implantable Lead Location: 753860
Implantable Lead Model: 5076
Implantable Pulse Generator Implant Date: 20151105
Lead Channel Impedance Value: 437 Ohm
Lead Channel Impedance Value: 456 Ohm
Lead Channel Impedance Value: 532 Ohm
Lead Channel Pacing Threshold Amplitude: 0.375 V
Lead Channel Pacing Threshold Amplitude: 1.125 V
Lead Channel Pacing Threshold Pulse Width: 0.4 ms
Lead Channel Pacing Threshold Pulse Width: 0.4 ms
Lead Channel Sensing Intrinsic Amplitude: 1.5 mV
Lead Channel Sensing Intrinsic Amplitude: 1.5 mV
Lead Channel Sensing Intrinsic Amplitude: 22.875 mV
Lead Channel Sensing Intrinsic Amplitude: 22.875 mV
Lead Channel Setting Pacing Amplitude: 2.25 V
Lead Channel Setting Pacing Amplitude: 2.5 V
Lead Channel Setting Pacing Pulse Width: 0.4 ms
Lead Channel Setting Sensing Sensitivity: 0.3 mV
Zone Setting Status: 755011

## 2023-01-30 ENCOUNTER — Ambulatory Visit (INDEPENDENT_AMBULATORY_CARE_PROVIDER_SITE_OTHER): Payer: Medicare Other | Admitting: Endocrinology

## 2023-01-30 ENCOUNTER — Other Ambulatory Visit: Payer: Self-pay | Admitting: Endocrinology

## 2023-01-30 ENCOUNTER — Encounter: Payer: Self-pay | Admitting: Endocrinology

## 2023-01-30 VITALS — BP 130/70 | HR 71 | Resp 20 | Ht 69.0 in | Wt 263.8 lb

## 2023-01-30 DIAGNOSIS — E782 Mixed hyperlipidemia: Secondary | ICD-10-CM

## 2023-01-30 DIAGNOSIS — I5022 Chronic systolic (congestive) heart failure: Secondary | ICD-10-CM | POA: Diagnosis not present

## 2023-01-30 DIAGNOSIS — E118 Type 2 diabetes mellitus with unspecified complications: Secondary | ICD-10-CM | POA: Diagnosis not present

## 2023-01-30 DIAGNOSIS — Z794 Long term (current) use of insulin: Secondary | ICD-10-CM | POA: Diagnosis not present

## 2023-01-30 DIAGNOSIS — I2510 Atherosclerotic heart disease of native coronary artery without angina pectoris: Secondary | ICD-10-CM

## 2023-01-30 DIAGNOSIS — E1165 Type 2 diabetes mellitus with hyperglycemia: Secondary | ICD-10-CM

## 2023-01-30 DIAGNOSIS — E890 Postprocedural hypothyroidism: Secondary | ICD-10-CM

## 2023-01-30 LAB — POCT GLYCOSYLATED HEMOGLOBIN (HGB A1C): Hemoglobin A1C: 6.9 % — AB (ref 4.0–5.6)

## 2023-01-30 MED ORDER — INSULIN LISPRO 100 UNIT/ML IJ SOLN
INTRAMUSCULAR | 3 refills | Status: AC
Start: 2023-01-30 — End: ?

## 2023-01-30 MED ORDER — TIRZEPATIDE 7.5 MG/0.5ML ~~LOC~~ SOAJ: 7.5000 mg | SUBCUTANEOUS | 3 refills | Status: DC

## 2023-01-30 MED ORDER — OMNIPOD 5 DEXG7G6 PODS GEN 5 MISC: 3 refills | Status: DC

## 2023-01-30 MED ORDER — DEXCOM G6 SENSOR MISC: 3 refills | Status: DC

## 2023-01-30 MED ORDER — ICOSAPENT ETHYL 1 G PO CAPS: 2.0000 g | ORAL_CAPSULE | Freq: Two times a day (BID) | ORAL | 3 refills | Status: DC

## 2023-01-30 MED ORDER — LOSARTAN POTASSIUM 25 MG PO TABS: 25.0000 mg | ORAL_TABLET | Freq: Every day | ORAL | 2 refills | Status: DC

## 2023-01-30 MED ORDER — ROSUVASTATIN CALCIUM 20 MG PO TABS: ORAL_TABLET | ORAL | 3 refills | Status: DC

## 2023-01-30 MED ORDER — DEXCOM G6 TRANSMITTER MISC: 3 refills | Status: DC

## 2023-01-30 NOTE — Progress Notes (Signed)
Outpatient Endocrinology Note Henry Timothy Townsel, MD  01/30/23  Patient's Name: Henry [name]    DOB: 1951-01-06    MRN: [id number]                                                    REASON OF VISIT: Follow-up for type 2 diabetes mellitus  PCP: Joaquim Nam, MD  HISTORY OF PRESENT ILLNESS:   Henry [name] is a 73 y.o. old male with past medical history listed below, is here for follow up of  type 2 diabetes mellitus /hypothyroidism.    Pertinent Diabetes History: Patient was diagnosed with type 2 diabetes mellitus in 2000.  He has been on OmniPod 5 insulin pump with Dexcom G6.  Patient's wife is helping with insulin pump management.  Patient has severe Parkinson's disease.  Chronic Diabetes Complications : Retinopathy: no. Last ophthalmology exam was done on annually reportedly. Nephropathy: CKD, on losartan, following with nephrology. Peripheral neuropathy:  yes, taking gabapentin from PCP.  Has history of numbness in his toes. Coronary artery disease: CAD. Has CHF Stroke: no.  He has Parkinson's disease.  Relevant comorbidities and cardiovascular risk factors: Obesity: yes Body mass index is 38.96 kg/m.  Hypertension: yes Hyperlipidemia. Yes, on statin.   Current / Home Diabetic regimen includes:  Mounjaro 7.5 mg weekly.  OmniPod 5 insulin Pump setting: Using Humalog U100. Basal ( 49.95) MN- 1.3u/hour 3:30AM-1.6  6AM- 2.0 12PM- 2.5 10PM - 2.2  Bolus CHO Ratio (1unit:CHO) MN- 1:10  Generally Boluses 3-5 units for breakfast, 4-6 lunch and 6-7 units for dinner, 2-3 units for snacks . Generally entering about 45 -55- 65 g of carbohydrate for the 3 meals   Correction/Sensitivity: MN- 1:6  Target: 120   Active insulin time: 2 hours  Prior diabetic medications: Amaryl several years ago and probably metformin.  Janumet.  Victoza.  Lantus.  V-Go pump in 2016. Marcelline Deist was stopped when he had UTI in May 2022.  CONTINUOUS GLUCOSE MONITORING SYSTEM (CGMS) /  INSULIN PUMP INTERPRETATION:                         OmniPod 5 Pump & DEXCOM G6 Sensor Download (Reviewed and summarized below.) Pump: Dexcom G6 and OmniPod 5 Dates: January 8 to January 30, 2023 for 14 days.   Average total daily insulin:  43.9 units, Basal: 69% ( 30.5 units), Bolus: 31%.   Automated mode in Use: 100%     Interpretation: Mostly acceptable blood sugar.  Occasional hyperglycemia with blood sugar up to 200s related with late meal bolus and rarely missed meal boluses.  Acceptable blood sugar overnight.  Acceptable blood sugar in between the meals.  No hypoglycemia.  Hypoglycemia: Patient has no hypoglycemic episodes. Patient has hypoglycemia awareness,.    Factors modifying glucose control: 1.  Diabetic diet assessment: 3 meals a day.  2.  Staying active or exercising: Not able to exercise due to medical condition.  3.  Medication compliance: compliant all of the time.  # Postablative hypothyroidism.  Patient has hypothyroidism secondary to radioactive iodine treatment for hyperthyroidism in 2013.   Interval history  Pump and insulin data as reviewed above.  Hemoglobin A1c today 6.9%.  Denies any complaints.  He has been taking levothyroxine 150 mcg daily, taking in the morning before breakfast  and reports compliance.  Denies palpitation or heat intolerance.  He has been taking Mounjaro and tolerating well denies any GI related symptoms.  Patient is accompanied by his wife in the clinic today.  REVIEW OF SYSTEMS As per history of present illness.   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)    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   Dysrhythmia    a-fib   GERD (gastroesophageal reflux disease)    Glaucoma    right eye   History of kidney stones    multiple   HLD (hyperlipidemia)    HTN (hypertension)    pt denies 08/19/12   Hyperplasia, prostate    Hypothyroidism    MI (myocardial infarction) (HCC)    Dr Jens Som 2000, x3vessels bypass   Neuromuscular disorder (HCC)    Parkinson's Disease   OSA (obstructive sleep apnea)    AHI-28,on CPAP, noncompliant with CPAP   Parkinson disease (HCC)    1999   PONV (postoperative nausea and vomiting)    Restless legs    Shortness of breath    Hx: of at all times   UTI (lower urinary tract infection) 09/15/2012   Klebsiella   Ventral hernia    Walker as ambulation aid    also uses wheelchair at home/when going out    PAST SURGICAL HISTORY: 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   BIOPSY  10/14/2018   Procedure: BIOPSY;  Surgeon: Beverley Fiedler, MD;  Location: [facility];  Service: Gastroenterology;;   CATARACT EXTRACTION W/ INTRAOCULAR LENS IMPLANT     Hx: of right eye   CATARACT EXTRACTION W/ INTRAOCULAR LENS IMPLANT Left 2018   COLONOSCOPY N/A 10/13/2014   Procedure: COLONOSCOPY;  Surgeon: Beverley Fiedler, MD;  Location: [facility];  Service: Gastroenterology;  Laterality: N/A;   COLONOSCOPY W/ BIOPSIES AND POLYPECTOMY     Hx: of   COLONOSCOPY WITH PROPOFOL N/A 10/14/2018   Procedure: COLONOSCOPY WITH PROPOFOL;  Surgeon: Beverley Fiedler, MD;  Location: [facility];  Service: Gastroenterology;  Laterality: N/A;   CORONARY ARTERY BYPASS GRAFT  2000   Tressie Stalker, MD   CORONARY STENT PLACEMENT  1998   DEEP BRAIN STIMULATOR PLACEMENT  2004   Right and left VIN stimulator placement (parkinsons)   EYE SURGERY     Cataract removed in 1980s related to wood in eye when 73 years old   FINGER AMPUTATION     left pointer   IMPLANTABLE  CARDIOVERTER DEFIBRILLATOR IMPLANT N/A 11/13/2013   Procedure: IMPLANTABLE CARDIOVERTER DEFIBRILLATOR IMPLANT;  Surgeon: Marinus Maw, MD;  Location: [facility] CATH LAB;  Service: Cardiovascular;  Laterality: N/A;   INSERT / REPLACE / REMOVE PACEMAKER     medtronic   LEFT AND RIGHT HEART CATHETERIZATION WITH CORONARY ANGIOGRAM N/A 09/24/2013   Procedure: LEFT AND RIGHT HEART CATHETERIZATION WITH CORONARY ANGIOGRAM;  Surgeon: Kathleene Hazel, MD;  Location: [facility] CATH LAB;  Service: Cardiovascular;  Laterality: N/A;   LITHOTRIPSY     3 different times   POLYPECTOMY  10/14/2018   Procedure: POLYPECTOMY;  Surgeon: Beverley Fiedler, MD;  Location: [facility];  Service: Gastroenterology;;   PULSE GENERATOR IMPLANT Right 11/13/2017   Procedure: Right chest implantable pulse generator change;  Surgeon: Maeola Harman, MD;  Location: [facility] OR;  Service: Neurosurgery;  Laterality: Right;  Right chest implantable pulse generator change   SUBTHALAMIC STIMULATOR BATTERY REPLACEMENT N/A 09/05/2012   Procedure: Deep brain stimulator battery change;  Surgeon: Maeola Harman, MD;  Location: [facility]O [facility]S;  Service: Neurosurgery;  Laterality: N/A;  Deep brain stimulator battery change   SUBTHALAMIC STIMULATOR BATTERY REPLACEMENT N/A 06/10/2015   Procedure: Deep Brain stimulator battery change;  Surgeon: Maeola Harman, MD;  Location: [facility];  Service: Neurosurgery;  Laterality: N/A;   SUBTHALAMIC STIMULATOR BATTERY REPLACEMENT Right 09/25/2019   Procedure: Deep brain stimulator battery change;  Surgeon: Maeola Harman, MD;  Location: [facility] OR;  Service: Neurosurgery;  Laterality: Right;   SUBTHALAMIC STIMULATOR BATTERY REPLACEMENT Right 12/15/2022   Procedure: IMPLANTABLE PULSE GENERATOR  BATTERY REPLACEMENT;  Surgeon: Bethann Goo, DO;  Location: [facility] OR;  Service: Neurosurgery;  Laterality: Right;  3C   TONSILLECTOMY      ALLERGIES: Allergies  Allergen Reactions   Penicillins Anaphylaxis and Rash    Because of a  history of documented adverse serious drug reaction;Medi Alert bracelet  is recommended PATIENT HAS HAD A PCN REACTION WITH IMMEDIATE RASH, FACIAL/TONGUE/THROAT SWELLING, SOB, OR LIGHTHEADEDNESS WITH HYPOTENSION:  #  #  YES  #  #  Has patient had a PCN reaction causing severe rash involving mucus membranes or skin necrosis: unknown Has patient had a PCN reaction that required hospitalization NO Has patient had a PCN reaction occurring within the last 10 years: NO   Klonopin [Clonazepam] Other (See Comments)    agitation   Peanut-Containing Drug Products Cough   Watermelon [Citrullus Vulgaris] Other (See Comments) and Cough    Tickle in throat    FAMILY HISTORY:  Family History  Problem Relation Age of Onset   Aneurysm Mother    Alcoholism Father    HIV/AIDS Brother 17       AIDS   Healthy Sister    Healthy Child    Peripheral vascular disease Other    Arthritis Other    Healthy Sister    Healthy Child    Healthy Child    Diabetes Neg Hx    Heart disease Neg Hx    Colon cancer Neg Hx    Prostate cancer Neg Hx     SOCIAL HISTORY: Social History   Socioeconomic History   Marital status: Married    Spouse name: [name]   Number of children: 2   Years of education: 12   Highest education level: High school graduate  Occupational History   Occupation: DISABLED    Comment: [employer], [employer]  Tobacco Use   Smoking status: Never    Passive exposure: Never   Smokeless tobacco: Never  Vaping Use   Vaping status: Never Used  Substance and Sexual Activity   Alcohol use: Not Currently   Drug use: No   Sexual activity: Not Currently  Other Topics Concern   Not on file  Social History Narrative   From [place]   Retired/disability Archivist   Likes to fish.     Married 1972   3 kids   [place]   Social Drivers  of Health   Financial Resource Strain: Low Risk  (12/23/2020)   Overall Financial Resource Strain (CARDIA)    Difficulty of Paying  Living Expenses: Not hard at all  Food Insecurity: No Food Insecurity (12/28/2021)   Hunger Vital Sign    Worried About Running Out of Food in the Last Year: Never true    Ran Out of Food in the Last Year: Never true  Transportation Needs: No Transportation Needs (12/28/2021)   PRAPARE - Administrator, Civil Service (Medical): No    Lack of Transportation (Non-Medical): No  Physical Activity: Inactive (12/28/2021)   Exercise Vital Sign    Days of Exercise per Week: 0 days    Minutes of Exercise per Session: 0 min  Stress: No Stress Concern Present (12/28/2021)   [employer] of Occupational Health - Occupational Stress Questionnaire    Feeling of Stress : Not at all  Social Connections: Socially Integrated (12/28/2021)   Social Connection and Isolation Panel [NHANES]    Frequency of Communication with Friends and Family: More than three times a week    Frequency of Social Gatherings with Friends and Family: More than three times a week    Attends Religious Services: More than 4 times per year    Active Member of [employer] or Organizations: Yes    Attends Engineer, structural: More than 4 times per year    Marital Status: Married    MEDICATIONS:  Current Outpatient Medications  Medication Sig Dispense Refill   acetaminophen (TYLENOL) 500 MG tablet Take 1,000 mg by mouth at bedtime as needed for mild pain or headache.      albuterol (VENTOLIN HFA) 108 (90 Base) MCG/ACT inhaler TAKE 2 PUFFS BY MOUTH EVERY 6 HOURS AS NEEDED FOR WHEEZE OR SHORTNESS OF BREATH 18 each 2   allopurinol (ZYLOPRIM) 100 MG tablet TAKE 1 AND 1/2 TABLETS BY MOUTH DAILY 135 tablet 3   aspirin EC 81 MG tablet Take 1 tablet (81 mg total) by mouth daily. Swallow whole.     Carbidopa-Levodopa ER (SINEMET CR) 25-100 MG tablet controlled release TAKE 2 TABLETS BY MOUTH 5 TIMES PER DAY AS DIRECTED 900 tablet 0   Cholecalciferol (VITAMIN D) 50 MCG (2000 UT) tablet Take 2,000 Units by mouth daily.       clotrimazole (LOTRIMIN) 1 % cream Apply 1 Application topically 2 (two) times daily. 30 g 0   colchicine 0.6 MG tablet Take 1 tablet (0.6 mg total) by mouth daily as needed (gout).     Continuous Blood Gluc Receiver (DEXCOM G6 RECEIVER) DEVI Use to check blood sugar daily 1 each 0   dorzolamide-timolol (COSOPT) 22.3-6.8 MG/ML ophthalmic solution Place 1 drop into the right eye 2 (two) times daily.     DULoxetine (CYMBALTA) 30 MG capsule TAKE 1 CAPSULE BY MOUTH EVERY DAY 90 capsule 0   fluticasone-salmeterol (WIXELA INHUB) 250-50 MCG/ACT AEPB INHALE 1 PUFF INTO THE LUNGS IN THE MORNING AND AT BEDTIME**RINSE AFTER USE** 180 each 1   FREESTYLE LITE test strip USE TO CHECK BLOOD SUGAR 4 TIMES DAILY. 150 strip 3   furosemide (LASIX) 20 MG tablet Take 20 mg tablet and 1/2 of the 80 mg tablet once daily to equal 60 mg daily 90 tablet 3   furosemide (LASIX) 80 MG tablet TAKE 1 TABLET BY MOUTH EVERY DAY (Patient taking differently: Take 40 mg by mouth daily.) 90 tablet 1   Insulin Disposable Pump (OMNIPOD 5 G6 INTRO, GEN 5,) KIT 1 kit  by Does not apply route daily. 1 kit 0   levothyroxine (SYNTHROID) 150 MCG tablet Take 1 tablet (150 mcg total) by mouth daily before breakfast. 90 tablet 3   metoprolol succinate (TOPROL-XL) 50 MG 24 hr tablet TAKE 1 TABLET BY MOUTH EVERY DAY WITH OR IMMEDIATELY FOLLOWING A MEAL 90 tablet 1   NUPLAZID 34 MG CAPS TAKE 1 CAPSULE BY MOUTH 1 TIME A DAY 90 capsule 0   OVER THE COUNTER MEDICATION Apply 1 application topically daily as needed (pain). Theraworx Pain Cream     Pimavanserin Tartrate (NUPLAZID) 34 MG CAPS Samples of this drug were given to the patient, quantity 5, Lot Number [id number] Exp 05/2022 30 capsule 0   polyethylene glycol (MIRALAX / GLYCOLAX) packet Take 8.5 g by mouth daily.     potassium chloride SA (KLOR-CON M20) 20 MEQ tablet Take 40 mEq by mouth in the morning and take 20 mEq in the evening 270 tablet 0   traMADol (ULTRAM) 50 MG tablet TAKE 1 TABLET BY  MOUTH EVERY 12 HOURS AS NEEDED. 60 tablet 1   triamcinolone cream (KENALOG) 0.1 % Apply 1 Application topically daily. Apply to rash 15 g 0   vitamin B-12 (CYANOCOBALAMIN) 1000 MCG tablet Take 1,000 mcg by mouth daily.     Continuous Glucose Sensor (DEXCOM G6 SENSOR) MISC Use to check blood sugar daily 9 each 3   Continuous Glucose Transmitter (DEXCOM G6 TRANSMITTER) MISC Change every 3 months 1 each 3   icosapent Ethyl (VASCEPA) 1 g capsule Take 2 capsules (2 g total) by mouth 2 (two) times daily. 360 capsule 3   Insulin Disposable Pump (OMNIPOD 5 DEXG7G6 PODS GEN 5) MISC CHANGE POD EVERY 3 DAYS 30 each 3   insulin lispro (HUMALOG) 100 UNIT/ML injection USE MAXIMUM 76 UNITS PER   DAY WITH V-GO PUMP 60 mL 3   losartan (COZAAR) 25 MG tablet Take 1 tablet (25 mg total) by mouth daily. 90 tablet 2   nitroGLYCERIN (NITROSTAT) 0.4 MG SL tablet Place 1 tablet (0.4 mg total) under the tongue every 5 (five) minutes as needed for chest pain. 25 tablet 12   rosuvastatin (CRESTOR) 20 MG tablet TAKE 1 TAB BY MOUTH EVERYDAY AT BEDTIME 90 tablet 3   spironolactone (ALDACTONE) 25 MG tablet Take 0.5 tablets (12.5 mg total) by mouth daily. 45 tablet 3   tirzepatide (MOUNJARO) 7.5 MG/0.5ML Pen Inject 7.5 mg into the skin once a week. 6 mL 3   No current facility-administered medications for this visit.    PHYSICAL EXAM: Vitals:   01/30/23 1356  BP: 130/70  Pulse: 71  Resp: 20  SpO2: 95%  Weight: 263 lb 12.8 oz (119.7 kg)  Height: 5\' 9"  (1.753 m)    Body mass index is 38.96 kg/m.  Wt Readings from Last 3 Encounters:  01/30/23 263 lb 12.8 oz (119.7 kg)  01/15/23 264 lb (119.7 kg)  01/05/23 262 lb (118.8 kg)    General: Well developed, well nourished male in no apparent distress.  HEENT: AT/[place], no external lesions.  Eyes: Conjunctiva clear and no icterus. Neck: Neck supple  Lungs: Respirations not labored Neurologic: Alert, oriented, normal speech Extremities / Skin: Dry. No sores or rashes  noted. Pump site okay.  Psychiatric: Does not appear depressed or anxious  Diabetic Foot Exam - Simple   No data filed    LABS Reviewed Lab Results  Component Value Date   HGBA1C 6.9 (A) 01/30/2023   HGBA1C 6.5 (A) 10/30/2022   HGBA1C  6.2 (A) 07/31/2022   Lab Results  Component Value Date   FRUCTOSAMINE 359 (H) 07/01/2019   FRUCTOSAMINE 277 11/09/2017   FRUCTOSAMINE 350 (H) 03/29/2017   Lab Results  Component Value Date   CHOL 105 07/28/2022   HDL 31 (L) 07/28/2022   LDLCALC 53 07/28/2022   LDLDIRECT 51.0 04/24/2019   TRIG 129 07/28/2022   CHOLHDL 3.4 07/28/2022   Lab Results  Component Value Date   MICRALBCREAT 11.0 10/24/2022   MICRALBCREAT 2.2 02/04/2021   Lab Results  Component Value Date   CREATININE 1.19 12/11/2022   Lab Results  Component Value Date   GFR 56.25 (L) 11/25/2021    ASSESSMENT / PLAN  1. Controlled type 2 diabetes mellitus with complication, with long-term current use of insulin (HCC)   2. Hypothyroidism following radioiodine therapy   3. Type 2 diabetes mellitus with hyperglycemia, with long-term current use of insulin (HCC)   4. Coronary artery disease involving native heart without angina pectoris, unspecified vessel or lesion type   5. Chronic systolic CHF (congestive heart failure) (HCC)   6. Mixed hyperlipidemia      Diabetes Mellitus type 2, complicated by diabetic neuropathy/CKD//CAD - Diabetic status / severity: Controlled.  Lab Results  Component Value Date   HGBA1C 6.9 (A) 01/30/2023    - Hemoglobin A1c goal <7%   - Medications: Changed in the pump setting as follows adjusted basal rates to match with his daily requirement based on automatic mode.  Advised to meal for bolus before eating, ideally 15 minutes before.  -Continue Mounjaro 7.5 mg weekly.  OmniPod 5 insulin Pump setting: Using Humalog U100. Basal ( 49.95) MN- 1.3u/hour, changed to 1.1 3:30AM-1.6 , changed to 1.1 6AM- 2.0, changed to 1.3 12PM- 2.5,  changed to 1.3 10PM - 2.2, changed to 1.1  Bolus CHO Ratio (1unit:CHO) MN- 1:10  Generally Boluses 3-5 units for breakfast, 4-6 lunch and 6-7 units for dinner, 2-3 units for snacks . Generally entering about 45 -55- 65 g of carbohydrate for the 3 meals   Correction/Sensitivity: MN- 1:6  Target: 120   Active insulin time: 2 hours  - Home glucose testing: continue CGM /Dexcom G6 and check blood glucose as needed.  - Discussed/ Gave Hypoglycemia treatment plan.  # Consult : not required at this time.   # Annual urine for microalbuminuria/ creatinine ratio, no microalbuminuria currently, continue ACE/ARB /losartan.  Following with nephrology.   Last  Lab Results  Component Value Date   MICRALBCREAT 11.0 10/24/2022    # Foot check nightly / neuropathy, continue gabapentin per PCP.  # Annual dilated diabetic eye exams.   - Diet: Make healthy diabetic food choices.  2. Blood pressure  -  BP Readings from Last 1 Encounters:  01/30/23 130/70    - Control is in target.  - No change in current plans.  3. Lipid status / Hyperlipidemia - Last  Lab Results  Component Value Date   LDLCALC 53 07/28/2022   - Continue rosuvastatin 20 mg daily, continue Vascepa.  # Postablative hypothyroidism. -Patient is currently taking levothyroxine 150 mcg daily.   -Will check thyroid function test today.  Diagnoses and all orders for this visit:  Controlled type 2 diabetes mellitus with complication, with long-term current use of insulin (HCC) -     POCT glycosylated hemoglobin (Hb A1C) -     tirzepatide (MOUNJARO) 7.5 MG/0.5ML Pen; Inject 7.5 mg into the skin once a week.  Hypothyroidism following radioiodine therapy -  T4, free -     TSH  Type 2 diabetes mellitus with hyperglycemia, with long-term current use of insulin (HCC) -     Continuous Glucose Sensor (DEXCOM G6 SENSOR) MISC; Use to check blood sugar daily -     Continuous Glucose Transmitter (DEXCOM G6 TRANSMITTER)  MISC; Change every 3 months -     Insulin Disposable Pump (OMNIPOD 5 DEXG7G6 PODS GEN 5) MISC; CHANGE POD EVERY 3 DAYS -     insulin lispro (HUMALOG) 100 UNIT/ML injection; USE MAXIMUM 76 UNITS PER   DAY WITH V-GO PUMP  Coronary artery disease involving native heart without angina pectoris, unspecified vessel or lesion type -     losartan (COZAAR) 25 MG tablet; Take 1 tablet (25 mg total) by mouth daily.  Chronic systolic CHF (congestive heart failure) (HCC) -     losartan (COZAAR) 25 MG tablet; Take 1 tablet (25 mg total) by mouth daily.  Mixed hyperlipidemia -     rosuvastatin (CRESTOR) 20 MG tablet; TAKE 1 TAB BY MOUTH EVERYDAY AT BEDTIME  Other orders -     icosapent Ethyl (VASCEPA) 1 g capsule; Take 2 capsules (2 g total) by mouth 2 (two) times daily.     DISPOSITION Follow up in clinic in 3  months suggested.   All questions answered and patient verbalized understanding of the plan.  Henry Laith Antonelli, MD [facility] Endocrinology [facility] [address] [place], [address] [place], [place] [address] Phone # [phone]  At least part of this note was generated using voice recognition software. Inadvertent word errors may have occurred, which were not recognized during the proofreading process.   Addendum: Labs reviewed TSH is elevated.  He is currently taking levothyroxine 137 mcg daily.  I would like to increase to levothyroxine 150 mcg daily.  Check TSH, free T4 1 week prior to follow-up visit with me in January.   Latest Reference Range & Units 10/30/22 15:06  TSH 0.35 - 5.50 uIU/mL 6.36 (H)  T4,Free(Direct) 0.60 - 1.60 ng/dL 2.13  (H): Data is abnormally high

## 2023-01-31 ENCOUNTER — Telehealth: Payer: Self-pay

## 2023-01-31 LAB — T4, FREE: Free T4: 1.2 ng/dL (ref 0.8–1.8)

## 2023-01-31 LAB — TSH: TSH: 3.78 m[IU]/L (ref 0.40–4.50)

## 2023-01-31 NOTE — Telephone Encounter (Signed)
-----   Message from [place] Thapa sent at 01/31/2023  5:30 AM EST ----- Please notify patient of normal thyroid function test, continue current dose of levothyroxine 150 mcg daily.

## 2023-01-31 NOTE — Telephone Encounter (Signed)
 Patient given results and medication changes as directed by MD. No further questions at this time.

## 2023-02-01 ENCOUNTER — Encounter: Payer: Self-pay | Admitting: Endocrinology

## 2023-02-12 ENCOUNTER — Other Ambulatory Visit: Payer: Self-pay | Admitting: Neurology

## 2023-02-12 DIAGNOSIS — G20A2 Parkinson's disease without dyskinesia, with fluctuations: Secondary | ICD-10-CM

## 2023-02-16 ENCOUNTER — Encounter
Payer: Federal, State, Local not specified - PPO | Attending: Physical Medicine & Rehabilitation | Admitting: Physical Medicine & Rehabilitation

## 2023-02-16 ENCOUNTER — Encounter: Payer: Self-pay | Admitting: Physical Medicine & Rehabilitation

## 2023-02-16 VITALS — BP 127/79 | HR 70 | Ht 69.0 in | Wt 262.0 lb

## 2023-02-16 DIAGNOSIS — G248 Other dystonia: Secondary | ICD-10-CM

## 2023-02-16 NOTE — Progress Notes (Signed)
 Subjective:    Patient ID: Henry [name], male    DOB: 07/01/1950, 73 y.o.   MRN: [id number]  HPI 73 year old male with history of Parkinson's disease he has dysarthria as well as balance disorder.  In addition he has had right lower extremity focal dystonia and was referred by neurology for lower extremity botulinum toxin injection.  Last injection was performed approximately 6 weeks ago Much better LE control, US  guided worked better  (also piriformis adedd) Needle: 22 g 80mm ECHO bloc needle Number of units per muscle Right tibialis posterior 75 units Right peroneus longus 75 units Right gluteus medius 75 units Right Piriformis 75 Pain Inventory Average Pain 0 Pain Right Now 0 My pain is  No pain  LOCATION OF PAIN  Here for follow up Botox    BOWEL Number of stools per week: 4-5 Oral laxative use Yes  Type of laxative Miralax    BLADDER Pads when away from home    Mobility walk with assistance use a walker ability to climb steps?  yes do you drive?  no Do you have any goals in this area?  yes  Function retired I need assistance with the following:  dressing, bathing, toileting, meal prep, household duties, shopping, and wife helps at home. Do you have any goals in this area?  yes  Neuro/Psych trouble walking  Prior Studies Any changes since last visit?  no  Physicians involved in your care Any changes since last visit?  no   Family History  Problem Relation Age of Onset   Aneurysm Mother    Alcoholism Father    HIV/AIDS Brother 34       AIDS   Healthy Sister    Healthy Child    Peripheral vascular disease Other    Arthritis Other    Healthy Sister    Healthy Child    Healthy Child    Diabetes Neg Hx    Heart disease Neg Hx    Colon cancer Neg Hx    Prostate cancer Neg Hx    Social History   Socioeconomic History   Marital status: Married    Spouse name: [name]   Number of children: 2   Years of education: 12   Highest education  level: High school graduate  Occupational History   Occupation: DISABLED    Comment: [employer], [employer]  Tobacco Use   Smoking status: Never    Passive exposure: Never   Smokeless tobacco: Never  Vaping Use   Vaping status: Never Used  Substance and Sexual Activity   Alcohol use: Not Currently   Drug use: No   Sexual activity: Not Currently  Other Topics Concern   Not on file  Social History Narrative   From [place]   Retired/disability archivist   Likes to fish.     Married 1972   3 kids   [place] fan   Social Drivers of Health   Financial Resource Strain: Low Risk  (12/23/2020)   Overall Financial Resource Strain (CARDIA)    Difficulty of Paying Living Expenses: Not hard at all  Food Insecurity: No Food Insecurity (12/28/2021)   Hunger Vital Sign    Worried About Running Out of Food in the Last Year: Never true    Ran Out of Food in the Last Year: Never true  Transportation Needs: No Transportation Needs (12/28/2021)   PRAPARE - Administrator, Civil Service (Medical): No    Lack of Transportation (Non-Medical): No  Physical  Activity: Inactive (12/28/2021)   Exercise Vital Sign    Days of Exercise per Week: 0 days    Minutes of Exercise per Session: 0 min  Stress: No Stress Concern Present (12/28/2021)   [employer] of Occupational Health - Occupational Stress Questionnaire    Feeling of Stress : Not at all  Social Connections: Socially Integrated (12/28/2021)   Social Connection and Isolation Panel [NHANES]    Frequency of Communication with Friends and Family: More than three times a week    Frequency of Social Gatherings with Friends and Family: More than three times a week    Attends Religious Services: More than 4 times per year    Active Member of Clubs or Organizations: Yes    Attends Engineer, Structural: More than 4 times per year    Marital Status: Married   Past Surgical History:  Procedure Laterality  Date   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   BIOPSY  10/14/2018   Procedure: BIOPSY;  Surgeon: Albertus Gordy HERO, MD;  Location: [facility];  Service: Gastroenterology;;   CATARACT EXTRACTION W/ INTRAOCULAR LENS IMPLANT     Hx: of right eye   CATARACT EXTRACTION W/ INTRAOCULAR LENS IMPLANT Left 2018   COLONOSCOPY N/A 10/13/2014   Procedure: COLONOSCOPY;  Surgeon: Gordy HERO Albertus, MD;  Location: [facility];  Service: Gastroenterology;  Laterality: N/A;   COLONOSCOPY W/ BIOPSIES AND POLYPECTOMY     Hx: of   COLONOSCOPY WITH PROPOFOL  N/A 10/14/2018   Procedure: COLONOSCOPY WITH PROPOFOL ;  Surgeon: Albertus Gordy HERO, MD;  Location: [facility];  Service: Gastroenterology;  Laterality: N/A;   CORONARY ARTERY BYPASS GRAFT  2000   Sudie Laine, MD   CORONARY STENT PLACEMENT  1998   DEEP BRAIN STIMULATOR PLACEMENT  2004   Right and left VIN stimulator placement (parkinsons)   EYE SURGERY     Cataract removed in 1980s related to wood in eye when 73 years old   FINGER AMPUTATION     left pointer   IMPLANTABLE CARDIOVERTER DEFIBRILLATOR IMPLANT N/A 11/13/2013   Procedure: IMPLANTABLE CARDIOVERTER DEFIBRILLATOR IMPLANT;  Surgeon: Danelle LELON Birmingham, MD;  Location: [facility] CATH LAB;  Service: Cardiovascular;  Laterality: N/A;   INSERT / REPLACE / REMOVE PACEMAKER     medtronic   LEFT AND RIGHT HEART CATHETERIZATION WITH CORONARY ANGIOGRAM N/A 09/24/2013   Procedure: LEFT AND RIGHT HEART CATHETERIZATION WITH CORONARY ANGIOGRAM;  Surgeon: Lonni JONETTA Cash, MD;  Location: [facility] CATH LAB;  Service: Cardiovascular;  Laterality: N/A;   LITHOTRIPSY     3 different times   POLYPECTOMY  10/14/2018   Procedure: POLYPECTOMY;  Surgeon: Albertus Gordy HERO, MD;  Location: [facility];  Service: Gastroenterology;;   PULSE GENERATOR IMPLANT Right 11/13/2017   Procedure: Right chest implantable pulse generator change;  Surgeon: Unice Pac, MD;  Location[facility] OR;  Service: Neurosurgery;  Laterality: Right;   Right chest implantable pulse generator change   SUBTHALAMIC STIMULATOR BATTERY REPLACEMENT N/A 09/05/2012   Procedure: Deep brain stimulator battery change;  Surgeon: Pac Unice, MD;  Location: [facility];  Service: Neurosurgery;  Laterality: N/A;  Deep brain stimulator battery change   SUBTHALAMIC STIMULATOR BATTERY REPLACEMENT N/A 06/10/2015   Procedure: Deep Brain stimulator battery change;  Surgeon: Pac Unice, MD;  Location: [facility];  Service: Neurosurgery;  Laterality: N/A;   SUBTHALAMIC STIMULATOR BATTERY REPLACEMENT Right 09/25/2019   Procedure: Deep brain stimulator battery change;  Surgeon: Unice Pac, MD;  Location: [facility]  OR;  Service: Neurosurgery;  Laterality: Right;   SUBTHALAMIC STIMULATOR BATTERY REPLACEMENT Right 12/15/2022   Procedure: IMPLANTABLE PULSE GENERATOR  BATTERY REPLACEMENT;  Surgeon: Carollee Lani BROCKS, DO;  Location: [facility] OR;  Service: Neurosurgery;  Laterality: Right;  3C   TONSILLECTOMY     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)   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   Dysrhythmia    a-fib   GERD (gastroesophageal reflux disease)    Glaucoma    right eye   History of kidney stones    multiple   HLD (hyperlipidemia)    HTN (hypertension)    pt denies 08/19/12   Hyperplasia, prostate    Hypothyroidism    MI (myocardial infarction) (HCC)    Dr Pietro 2000, x3vessels bypass   Neuromuscular disorder (HCC)    Parkinson's Disease   OSA (obstructive sleep apnea)    AHI-28,on CPAP, noncompliant with CPAP   Parkinson disease (HCC)    1999   PONV (postoperative nausea and vomiting)     Restless legs    Shortness of breath    Hx: of at all times   UTI (lower urinary tract infection) 09/15/2012   Klebsiella   Ventral hernia    Walker as ambulation aid    also uses wheelchair at home/when going out   There were no vitals taken for this visit.  Opioid Risk Score:   Fall Risk Score:  `1  Depression screen [facility] 2/9     11/21/2022   12:45 PM 10/10/2022   11:26 AM 09/12/2022    2:58 PM 03/03/2022   12:30 PM 12/28/2021   12:43 PM 12/23/2020   11:25 AM 08/16/2018   12:16 PM  Depression screen PHQ 2/9  Decreased Interest 0 0 0 0 0 0 0  Down, Depressed, Hopeless 0 0 1 0 0 0 0  PHQ - 2 Score 0 0 1 0 0 0 0  Altered sleeping   1 0   3  Tired, decreased energy   1 0   0  Change in appetite   0 0   0  Feeling bad or failure about yourself    0 0   0  Trouble concentrating   1 0   0  Moving slowly or fidgety/restless   3 0   0  Suicidal thoughts   0 0   0  PHQ-9 Score   7 0   3  Difficult doing work/chores   Somewhat difficult Not difficult at all   Not difficult at all    Review of Systems  Musculoskeletal:  Positive for gait problem.  All other systems reviewed and are negative.      Objective:   Physical Exam Ambulates without evidence of foot drop Mild external rotation of the right foot no evidence of foot inversion or eversion. 4/5 ankle dorsiflexor inversion 3 - foot eversion 5/5 knee extension and hip flexion on the right side. Speech hypophonic hyperkinetic dysarthria     Assessment &  Plan:   1.  Focal dystonia associated with Parkinson's disease.  He follows up with Dr. Evonnie for medication management as well as brain stimulator management. Would continue with the same Botox  dosage and muscle group selection and use ultrasound guidance.  Repeat injection in 6 weeks

## 2023-02-25 ENCOUNTER — Other Ambulatory Visit: Payer: Self-pay | Admitting: Neurology

## 2023-02-25 DIAGNOSIS — G20A1 Parkinson's disease without dyskinesia, without mention of fluctuations: Secondary | ICD-10-CM

## 2023-02-25 DIAGNOSIS — G20A2 Parkinson's disease without dyskinesia, with fluctuations: Secondary | ICD-10-CM

## 2023-02-25 DIAGNOSIS — G253 Myoclonus: Secondary | ICD-10-CM

## 2023-02-28 NOTE — Progress Notes (Signed)
 Remote ICD transmission.

## 2023-03-07 DIAGNOSIS — H40032 Anatomical narrow angle, left eye: Secondary | ICD-10-CM | POA: Diagnosis not present

## 2023-03-07 DIAGNOSIS — E119 Type 2 diabetes mellitus without complications: Secondary | ICD-10-CM | POA: Diagnosis not present

## 2023-03-07 DIAGNOSIS — H4031X3 Glaucoma secondary to eye trauma, right eye, severe stage: Secondary | ICD-10-CM | POA: Diagnosis not present

## 2023-03-07 DIAGNOSIS — H04123 Dry eye syndrome of bilateral lacrimal glands: Secondary | ICD-10-CM | POA: Diagnosis not present

## 2023-03-07 LAB — HM DIABETES EYE EXAM

## 2023-03-08 ENCOUNTER — Encounter: Payer: Self-pay | Admitting: Endocrinology

## 2023-03-12 ENCOUNTER — Other Ambulatory Visit: Payer: Self-pay | Admitting: Family Medicine

## 2023-03-12 NOTE — Telephone Encounter (Signed)
 Last refill: traMADol (ULTRAM) 50 MG tablet 01/12/23 60 tablets 1 refill Last office visit: 02/11/22 Next office visit: 04/06/23

## 2023-03-13 NOTE — Telephone Encounter (Signed)
 Sent. Thanks.

## 2023-03-15 ENCOUNTER — Telehealth: Payer: Self-pay

## 2023-03-15 ENCOUNTER — Other Ambulatory Visit (HOSPITAL_COMMUNITY): Payer: Self-pay

## 2023-03-15 NOTE — Telephone Encounter (Signed)
 PA request has been Submitted. New Encounter has been or will be created for follow up. For additional info see Pharmacy Prior Auth telephone encounter from 03/15/23.

## 2023-03-15 NOTE — Telephone Encounter (Signed)
 Pharmacy Patient Advocate Encounter   Received notification from Patient Advice Request messages that prior authorization for Omnipod is required/requested.   Insurance verification completed.   The patient is insured through [facility] .   Per test claim: PA required; PA submitted to above mentioned insurance via CoverMyMeds Key/confirmation #/EOC [id number] Status is pending

## 2023-03-16 NOTE — Telephone Encounter (Signed)
 Pharmacy Patient Advocate Encounter  Received notification from [facility] that Prior Authorization for Omnipod 5 DexG7G6 Pods Gen 5 Misc has been APPROVED from 01-10-2023 to 03-14-2024   PA #/Case ID/Reference #: [id number]

## 2023-03-21 ENCOUNTER — Ambulatory Visit (INDEPENDENT_AMBULATORY_CARE_PROVIDER_SITE_OTHER): Admitting: Family Medicine

## 2023-03-21 ENCOUNTER — Encounter: Payer: Self-pay | Admitting: Family Medicine

## 2023-03-21 VITALS — BP 118/74 | HR 75 | Temp 97.9°F | Ht 69.0 in | Wt 256.0 lb

## 2023-03-21 DIAGNOSIS — H00015 Hordeolum externum left lower eyelid: Secondary | ICD-10-CM

## 2023-03-21 DIAGNOSIS — H00019 Hordeolum externum unspecified eye, unspecified eyelid: Secondary | ICD-10-CM | POA: Insufficient documentation

## 2023-03-21 NOTE — Assessment & Plan Note (Signed)
 Medial left lower eyelid on lash line- very small / erythematous , started this am Overall reassuring exam Pt is diabetic  Discussed keeping lash area clean with baby shampoo and water Use warm compress frequently (clean)  Try not to touch  See AVS for callback parameters incl increase in redness/ pain/ or any vision change  Update if not starting to improve in a week or if worsening  Call back and Er precautions noted in detail today   Handout given

## 2023-03-21 NOTE — Progress Notes (Signed)
 Subjective:    Patient ID: Henry [name], male    DOB: 1950/06/02, 73 y.o.   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 presents  for a possible stye on left lower eyelid  He has history of diabetes and CKD and Parkinsons and gout   Noticed it yesterday , worse this am   Along the edge of lower lid  A little sore / little tender  Does not use drops   No change in vision  No light sensitivity  No recent virus or head cold  No eye ball pain or itching   Uses drop in right eye /not the left  For pressure issues     Patient Active Problem List   Diagnosis Date Noted   Hordeolum 03/21/2023   Pancreatic cyst 03/05/2022   Lower respiratory infection 02/20/2022   Lobar pneumonia (HCC) 02/20/2022   Sore throat 01/12/2022   Cyst of perianal area 01/12/2022   Strep pharyngitis 12/16/2021   Wheezing 12/16/2021   DOE (dyspnea on exertion) 12/16/2021   Parkinson's disease with EBS (electrical brain stimulation) [G20.A1] 12/05/2021   Ear lobe laceration 06/15/2021   Fall at home 06/15/2021   Asthma    Skin lesion 05/30/2019   CAD (coronary artery disease) 08/25/2018   Diabetes mellitus type 2, uncontrolled 08/15/2018   Thrombocytopenia (HCC) 07/08/2017   Gout, unspecified 06/06/2017   Dysuria 05/25/2017   Microscopic hematuria 05/25/2017   CKD (chronic kidney disease) stage 3, GFR 30-59 ml/min (HCC) 04/12/2017   Neuropathy 02/25/2017   Urinary frequency 09/19/2016   Constipation 11/27/2015   Medicare annual wellness visit, initial 09/23/2015   Cough 01/26/2015   Spinal stenosis of lumbar region 01/26/2015   History of colonic polyps    Restless leg syndrome 08/11/2014   ICD- MDT, implanted 11/13/13 11/14/2013   Chronic systolic CHF  (congestive heart failure) (HCC) 11/13/2013   Acute on chronic combined systolic and diastolic congestive heart failure, NYHA class 4 (HCC) 05/12/2013   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   Umbilical hernia 01/27/2013   Elevated PSA 12/04/2012   Other malaise and fatigue 12/04/2012   PD (Parkinson's disease) (HCC) 08/19/2012   OSA on CPAP 04/17/2012   Dysautonomia (HCC) 04/17/2012   Obesity, morbid (HCC) 04/17/2012   Hypothyroidism following radioiodine therapy 10/16/2011   DYSPNEA/SHORTNESS OF BREATH 11/09/2009   GERD 05/11/2009   Type 2 diabetes mellitus with chronic kidney disease, with long-term current use of insulin (HCC) 04/26/2009   Back pain 08/18/2008   OTHER SPEC FORMS CHRONIC ISCHEMIC HEART DISEASE 04/24/2008   HYPERPLASIA PROSTATE UNS W/O UR OBST & OTH LUTS 09/02/2007   NEPHROLITHIASIS, HX OF 09/02/2007   Ventral hernia 12/28/2006   Hyperlipidemia 08/31/2006   HTN (hypertension) 08/31/2006   Hx of CABG x 3 in 2000 08/31/2006   Past Medical History:  Diagnosis Date   AICD (automatic cardioverter/defibrillator) present    Dr Ladona Ridgel office visit yearly, MDT  medtronic    Arthritis    cane   Asthma    CAD (coronary artery disease)    Cardiomyopathy    Cataract    removed   Complication of anesthesia    pt states that  he got a rash   Constipation    Deaf    right ear, 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)   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   Dysrhythmia    a-fib   GERD (gastroesophageal reflux disease)    Glaucoma    right eye   History of kidney stones    multiple   HLD (hyperlipidemia)    HTN (hypertension)    pt denies 08/19/12   Hyperplasia, prostate    Hypothyroidism    MI (myocardial infarction) (HCC)    Dr Jens Som 2000, x3vessels bypass   Neuromuscular disorder (HCC)    Parkinson's Disease   OSA (obstructive sleep apnea)    AHI-28,on CPAP, noncompliant with CPAP   Parkinson disease (HCC)    1999   PONV (postoperative  nausea and vomiting)    Restless legs    Shortness of breath    Hx: of at all times   UTI (lower urinary tract infection) 09/15/2012   Klebsiella   Ventral hernia    Walker as ambulation aid    also uses wheelchair at home/when going out   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   BIOPSY  10/14/2018   Procedure: BIOPSY;  Surgeon: Beverley Fiedler, MD;  Location: [facility];  Service: Gastroenterology;;   CATARACT EXTRACTION W/ INTRAOCULAR LENS IMPLANT     Hx: of right eye   CATARACT EXTRACTION W/ INTRAOCULAR LENS IMPLANT Left 2018   COLONOSCOPY N/A 10/13/2014   Procedure: COLONOSCOPY;  Surgeon: Beverley Fiedler, MD;  Location: [facility];  Service: Gastroenterology;  Laterality: N/A;   COLONOSCOPY W/ BIOPSIES AND POLYPECTOMY     Hx: of   COLONOSCOPY WITH PROPOFOL N/A 10/14/2018   Procedure: COLONOSCOPY WITH PROPOFOL;  Surgeon: Beverley Fiedler, MD;  Location: [facility];  Service: Gastroenterology;  Laterality: N/A;   CORONARY ARTERY BYPASS GRAFT  2000   Tressie Stalker, MD   CORONARY STENT PLACEMENT  1998   DEEP BRAIN STIMULATOR PLACEMENT  2004   Right and left VIN stimulator placement (parkinsons)   EYE SURGERY     Cataract removed in 1980s related to wood in eye when 73 years old   FINGER AMPUTATION     left pointer   IMPLANTABLE CARDIOVERTER DEFIBRILLATOR IMPLANT N/A 11/13/2013   Procedure: IMPLANTABLE CARDIOVERTER DEFIBRILLATOR IMPLANT;  Surgeon: Marinus Maw, MD;  Location: [facility] CATH LAB;  Service: Cardiovascular;  Laterality: N/A;   INSERT / REPLACE / REMOVE PACEMAKER     medtronic   LEFT AND RIGHT HEART CATHETERIZATION WITH CORONARY ANGIOGRAM N/A 09/24/2013   Procedure: LEFT AND RIGHT HEART CATHETERIZATION WITH CORONARY ANGIOGRAM;  Surgeon: Kathleene Hazel, MD;  Location: [facility] CATH LAB;  Service: Cardiovascular;  Laterality: N/A;   LITHOTRIPSY     3 different times   POLYPECTOMY  10/14/2018   Procedure:  POLYPECTOMY;  Surgeon: Beverley Fiedler, MD;  Location: [facility];  Service: Gastroenterology;;   PULSE GENERATOR IMPLANT Right 11/13/2017   Procedure: Right chest implantable pulse generator change;  Surgeon: Maeola Harman, MD;  Location: [facility] OR;  Service: Neurosurgery;  Laterality: Right;  Right chest implantable pulse generator change   SUBTHALAMIC STIMULATOR BATTERY REPLACEMENT N/A 09/05/2012   Procedure: Deep brain stimulator battery change;  Surgeon: Maeola Harman, MD;  Location: [facility];  Service: Neurosurgery;  Laterality: N/A;  Deep brain stimulator battery change   SUBTHALAMIC STIMULATOR  BATTERY REPLACEMENT N/A 06/10/2015   Procedure: Deep Brain stimulator battery change;  Surgeon: Maeola Harman, MD;  Location: [facility];  Service: Neurosurgery;  Laterality: N/A;   SUBTHALAMIC STIMULATOR BATTERY REPLACEMENT Right 09/25/2019   Procedure: Deep brain stimulator battery change;  Surgeon: Maeola Harman, MD;  Location: [facility] OR;  Service: Neurosurgery;  Laterality: Right;   SUBTHALAMIC STIMULATOR BATTERY REPLACEMENT Right 12/15/2022   Procedure: IMPLANTABLE PULSE GENERATOR  BATTERY REPLACEMENT;  Surgeon: Bethann Goo, DO;  Location: [facility] OR;  Service: Neurosurgery;  Laterality: Right;  3C   TONSILLECTOMY     Social History   Tobacco Use   Smoking status: Never    Passive exposure: Never   Smokeless tobacco: Never  Vaping Use   Vaping status: Never Used  Substance Use Topics   Alcohol use: Not Currently   Drug use: No   Family History  Problem Relation Age of Onset   Aneurysm Mother    Alcoholism Father    HIV/AIDS Brother 38       AIDS   Healthy Sister    Healthy Child    Peripheral vascular disease Other    Arthritis Other    Healthy Sister    Healthy Child    Healthy Child    Diabetes Neg Hx    Heart disease Neg Hx    Colon cancer Neg Hx    Prostate cancer Neg Hx    Allergies  Allergen Reactions   Penicillins Anaphylaxis and Rash    Because of a history of  documented adverse serious drug reaction;Medi Alert bracelet  is recommended PATIENT HAS HAD A PCN REACTION WITH IMMEDIATE RASH, FACIAL/TONGUE/THROAT SWELLING, SOB, OR LIGHTHEADEDNESS WITH HYPOTENSION:  #  #  YES  #  #  Has patient had a PCN reaction causing severe rash involving mucus membranes or skin necrosis: unknown Has patient had a PCN reaction that required hospitalization NO Has patient had a PCN reaction occurring within the last 10 years: NO   Klonopin [Clonazepam] Other (See Comments)    agitation   Peanut-Containing Drug Products Cough   Watermelon [Citrullus Vulgaris] Other (See Comments) and Cough    Tickle in throat   Current Outpatient Medications on File Prior to Visit  Medication Sig Dispense Refill   acetaminophen (TYLENOL) 500 MG tablet Take 1,000 mg by mouth at bedtime as needed for mild pain or headache.      albuterol (VENTOLIN HFA) 108 (90 Base) MCG/ACT inhaler TAKE 2 PUFFS BY MOUTH EVERY 6 HOURS AS NEEDED FOR WHEEZE OR SHORTNESS OF BREATH 18 each 2   allopurinol (ZYLOPRIM) 100 MG tablet TAKE 1 AND 1/2 TABLETS BY MOUTH DAILY 135 tablet 3   aspirin EC 81 MG tablet Take 1 tablet (81 mg total) by mouth daily. Swallow whole.     Carbidopa-Levodopa ER (SINEMET CR) 25-100 MG tablet controlled release TAKE 2 TABLETS BY MOUTH 5 TIMES PER DAY AS DIRECTED 900 tablet 0   Cholecalciferol (VITAMIN D) 50 MCG (2000 UT) tablet Take 2,000 Units by mouth daily.      colchicine 0.6 MG tablet Take 1 tablet (0.6 mg total) by mouth daily as needed (gout).     Continuous Blood Gluc Receiver (DEXCOM G6 RECEIVER) DEVI Use to check blood sugar daily 1 each 0   Continuous Glucose Sensor (DEXCOM G6 SENSOR) MISC USE TO CHECK BLOOD SUGAR DAILY 9 each 3   Continuous Glucose Transmitter (DEXCOM G6 TRANSMITTER) MISC Change every 3 months 1 each 3   dorzolamide-timolol (COSOPT)  22.3-6.8 MG/ML ophthalmic solution Place 1 drop into the right eye 2 (two) times daily.     DULoxetine (CYMBALTA) 30 MG  capsule TAKE 1 CAPSULE BY MOUTH EVERY DAY 90 capsule 0   fluticasone-salmeterol (WIXELA INHUB) 250-50 MCG/ACT AEPB INHALE 1 PUFF INTO THE LUNGS IN THE MORNING AND AT BEDTIME**RINSE AFTER USE** 180 each 1   FREESTYLE LITE test strip USE TO CHECK BLOOD SUGAR 4 TIMES DAILY. 150 strip 3   furosemide (LASIX) 20 MG tablet Take 20 mg tablet and 1/2 of the 80 mg tablet once daily to equal 60 mg daily 90 tablet 3   furosemide (LASIX) 80 MG tablet TAKE 1 TABLET BY MOUTH EVERY DAY (Patient taking differently: Take 40 mg by mouth daily.) 90 tablet 1   icosapent Ethyl (VASCEPA) 1 g capsule Take 2 capsules (2 g total) by mouth 2 (two) times daily. 360 capsule 3   Insulin Disposable Pump (OMNIPOD 5 DEXG7G6 PODS GEN 5) MISC CHANGE POD EVERY 3 DAYS 30 each 3   Insulin Disposable Pump (OMNIPOD 5 G6 INTRO, GEN 5,) KIT 1 kit by Does not apply route daily. 1 kit 0   insulin lispro (HUMALOG) 100 UNIT/ML injection USE MAXIMUM 76 UNITS PER   DAY WITH V-GO PUMP 60 mL 3   levothyroxine (SYNTHROID) 150 MCG tablet Take 1 tablet (150 mcg total) by mouth daily before breakfast. 90 tablet 3   losartan (COZAAR) 25 MG tablet Take 1 tablet (25 mg total) by mouth daily. 90 tablet 2   metoprolol succinate (TOPROL-XL) 50 MG 24 hr tablet TAKE 1 TABLET BY MOUTH EVERY DAY WITH OR IMMEDIATELY FOLLOWING A MEAL 90 tablet 1   NUPLAZID 34 MG CAPS TAKE 1 CAPSULE BY MOUTH 1 TIME A DAY 90 capsule 0   OVER THE COUNTER MEDICATION Apply 1 application topically daily as needed (pain). Theraworx Pain Cream     polyethylene glycol (MIRALAX / GLYCOLAX) packet Take 8.5 g by mouth daily.     potassium chloride SA (KLOR-CON M20) 20 MEQ tablet Take 40 mEq by mouth in the morning and take 20 mEq in the evening 270 tablet 0   rosuvastatin (CRESTOR) 20 MG tablet TAKE 1 TAB BY MOUTH EVERYDAY AT BEDTIME 90 tablet 3   tirzepatide (MOUNJARO) 7.5 MG/0.5ML Pen Inject 7.5 mg into the skin once a week. 6 mL 3   traMADol (ULTRAM) 50 MG tablet TAKE 1 TABLET BY MOUTH  EVERY 12 HOURS AS NEEDED 60 tablet 1   vitamin B-12 (CYANOCOBALAMIN) 1000 MCG tablet Take 1,000 mcg by mouth daily.     nitroGLYCERIN (NITROSTAT) 0.4 MG SL tablet Place 1 tablet (0.4 mg total) under the tongue every 5 (five) minutes as needed for chest pain. 25 tablet 12   spironolactone (ALDACTONE) 25 MG tablet Take 0.5 tablets (12.5 mg total) by mouth daily. 45 tablet 3   No current facility-administered medications on file prior to visit.    Review of Systems  Constitutional:  Negative for fatigue and fever.  HENT:  Negative for congestion, rhinorrhea, sinus pressure, sinus pain and sore throat.   Eyes:  Negative for photophobia, pain, discharge, redness, itching and visual disturbance.       Small red spot on left lower lid       Objective:   Physical Exam Eyes:     General: Vision grossly intact. Gaze aligned appropriately. No scleral icterus.       Right eye: No discharge.        Left eye: No  discharge.     Extraocular Movements: Extraocular movements intact.     Conjunctiva/sclera: Conjunctivae normal.     Pupils: Pupils are equal, round, and reactive to light.     Comments: Small (2 mm) erythematous lesion on left medial lower lash line consistent with mild hordeolum  No drainage Minimally tender   No eye or conjunctival changes  No excess tearing   No gross vision changes   Cardiovascular:     Rate and Rhythm: Normal rate and regular rhythm.  Pulmonary:     Effort: Pulmonary effort is normal. No respiratory distress.  Musculoskeletal:     Cervical back: Neck supple.  Lymphadenopathy:     Cervical: No cervical adenopathy.  Neurological:     Cranial Nerves: No cranial nerve deficit.     Comments: Bradykinesia  Reduced facial movements Drenda Freeze is affected   Psychiatric:        Mood and Affect: Mood normal.           Assessment & Plan:   Problem List Items Addressed This Visit       Other   Hordeolum - Primary   Medial left lower eyelid on lash line-  very small / erythematous , started this am Overall reassuring exam Pt is diabetic  Discussed keeping lash area clean with baby shampoo and water Use warm compress frequently (clean)  Try not to touch  See AVS for callback parameters incl increase in redness/ pain/ or any vision change  Update if not starting to improve in a week or if worsening  Call back and Er precautions noted in detail today   Handout given

## 2023-03-21 NOTE — Patient Instructions (Signed)
 Keep the affected area clean and try not to touch it  Baby shampoo is a good option   Use a warm clean compress as often as you can as well   Watch for  Increased swelling/ redness or pain  If it drains-that is ok  Change in vision or sensitivity to light   Update if not starting to improve in a week or if worsening

## 2023-03-30 ENCOUNTER — Encounter: Payer: Medicare Other | Attending: Physical Medicine & Rehabilitation | Admitting: Physical Medicine & Rehabilitation

## 2023-03-30 ENCOUNTER — Encounter: Payer: Self-pay | Admitting: Physical Medicine & Rehabilitation

## 2023-03-30 VITALS — BP 115/71 | HR 65 | Ht 69.0 in | Wt 256.0 lb

## 2023-03-30 DIAGNOSIS — G248 Other dystonia: Secondary | ICD-10-CM | POA: Insufficient documentation

## 2023-03-30 MED ORDER — SODIUM CHLORIDE (PF) 0.9 % IJ SOLN
6.0000 mL | Freq: Once | INTRAMUSCULAR | Status: AC
  Administered 2023-03-30: 6 mL

## 2023-03-30 MED ORDER — ONABOTULINUMTOXINA 100 UNITS IJ SOLR
300.0000 [IU] | Freq: Once | INTRAMUSCULAR | Status: AC
  Administered 2023-03-30: 300 [IU] via INTRAMUSCULAR

## 2023-03-30 NOTE — Progress Notes (Signed)
 Botox Injection for spasticity using ultrasound guidance  Dilution: 50 units/ml Indication: Severe spasticity which interferes with ADL,mobility and/or  hygiene and is unresponsive to medication management and other conservative care Informed consent was obtained after describing risks and benefits of the procedure with the patient. This includes bleeding, bruising, infection, excessive weakness, or medication side effects. A REMS form is on file and signed. Needle: 22 g 80mm ECHO bloc needle used for piriformis and Glut medius 50mm 25g used with needle EMG for Tib post and peroneus longus  Number of units per muscle Right tibialis posterior 75 units Right peroneus longus 75 units Right gluteus medius 75 units Right Piriformis 75 Buttocks injections were done under direct ultrasound guidance, 4 Hz curvilinear transducer using ECHO block needle long axis view, the patient tolerated the procedure well. Post procedure instructions were given. A followup appointment was made.

## 2023-03-30 NOTE — Patient Instructions (Signed)

## 2023-04-05 ENCOUNTER — Other Ambulatory Visit: Payer: Self-pay | Admitting: Neurology

## 2023-04-05 ENCOUNTER — Ambulatory Visit: Payer: Medicare Other

## 2023-04-05 VITALS — Ht 69.0 in | Wt 256.0 lb

## 2023-04-05 DIAGNOSIS — G20A1 Parkinson's disease without dyskinesia, without mention of fluctuations: Secondary | ICD-10-CM

## 2023-04-05 DIAGNOSIS — R443 Hallucinations, unspecified: Secondary | ICD-10-CM

## 2023-04-05 DIAGNOSIS — Z0000 Encounter for general adult medical examination without abnormal findings: Secondary | ICD-10-CM

## 2023-04-05 NOTE — Patient Instructions (Signed)
 [name] , Thank you for taking time to come for your Medicare Wellness Visit. I appreciate your ongoing commitment to your health goals. Please review the following plan we discussed and let me know if I can assist you in the future.   Referrals/Orders/Follow-Ups/Clinician Recommendations: none  This is a list of the screening recommended for you and due dates:  Health Maintenance  Topic Date Due   Zoster (Shingles) Vaccine (1 of 2) Never done   Colon Cancer Screening  10/13/2021   COVID-19 Vaccine (3 - 2024-25 season) 09/10/2022   Hemoglobin A1C  07/30/2023   Complete foot exam   07/31/2023   Yearly kidney health urinalysis for diabetes  10/24/2023   Yearly kidney function blood test for diabetes  12/11/2023   Eye exam for diabetics  03/06/2024   Medicare Annual Wellness Visit  04/04/2024   DTaP/Tdap/Td vaccine (3 - Td or Tdap) 07/22/2030   Pneumonia Vaccine  Completed   Flu Shot  Completed   Hepatitis C Screening  Completed   HPV Vaccine  Aged Out    Advanced directives: (Copy Requested) Please bring a copy of your health care power of attorney and living will to the office to be added to your chart at your convenience. You can mail to [facility] [address] [place], [place] [address] or email to [email or web address]  Next Medicare Annual Wellness Visit scheduled for next year: Yes 04/08/24 @ 3:40pm televisit

## 2023-04-05 NOTE — Progress Notes (Signed)
 Subjective:   Henry [name] is a 73 y.o. who presents for a Medicare Wellness preventive visit.  Visit Complete: Virtual I connected with  Henry [name] on 04/05/23 by a audio enabled telemedicine application and verified that I am speaking with the correct person using two identifiers.  Patient Location: Home  Provider Location: Office/Clinic  I discussed the limitations of evaluation and management by telemedicine. The patient expressed understanding and agreed to proceed.  Vital Signs: Because this visit was a virtual/telehealth visit, some criteria may be missing or patient reported. Any vitals not documented were not able to be obtained and vitals that have been documented are patient reported.  VideoDeclined- This patient declined Librarian, academic. Therefore the visit was completed with audio only.  Persons Participating in Visit: Patient assisted by wife.Henry [name]  AWV Questionnaire: No: Patient Medicare AWV questionnaire was not completed prior to this visit.  Cardiac Risk Factors include: advanced age (>23men, >25 women);diabetes mellitus;dyslipidemia;hypertension;male gender;obesity (BMI >30kg/m2);sedentary lifestyle     Objective:    Today's Vitals   04/05/23 1541  Weight: 256 lb (116.1 kg)  Height: 5\' 9"  (1.753 m)   Body mass index is 37.8 kg/m.     04/05/2023    3:50 PM 01/15/2023    1:05 PM 12/11/2022    1:16 PM 11/13/2022   12:53 PM 06/06/2022    1:18 PM 12/28/2021   12:48 PM 12/05/2021   12:46 PM  Advanced Directives  Does Patient Have a Medical Advance Directive? Yes Yes Yes Yes Yes Yes Yes  Type of Estate agent of [place];Living will Living will Healthcare Power of [place];Living will Living will Living will Healthcare Power of [place];Living will Living will  Copy of Healthcare Power of Attorney in Chart? No - copy requested  Yes - validated most recent copy scanned in chart (See row information)    No - copy requested     Current Medications (verified) Outpatient Encounter Medications as of 04/05/2023  Medication Sig   acetaminophen (TYLENOL) 500 MG tablet Take 1,000 mg by mouth at bedtime as needed for mild pain or headache.    albuterol (VENTOLIN HFA) 108 (90 Base) MCG/ACT inhaler TAKE 2 PUFFS BY MOUTH EVERY 6 HOURS AS NEEDED FOR WHEEZE OR SHORTNESS OF BREATH   allopurinol (ZYLOPRIM) 100 MG tablet TAKE 1 AND 1/2 TABLETS BY MOUTH DAILY   aspirin EC 81 MG tablet Take 1 tablet (81 mg total) by mouth daily. Swallow whole.   Carbidopa-Levodopa ER (SINEMET CR) 25-100 MG tablet controlled release TAKE 2 TABLETS BY MOUTH 5 TIMES PER DAY AS DIRECTED   Cholecalciferol (VITAMIN D) 50 MCG (2000 UT) tablet Take 2,000 Units by mouth daily.    colchicine 0.6 MG tablet Take 1 tablet (0.6 mg total) by mouth daily as needed (gout).   Continuous Blood Gluc Receiver (DEXCOM G6 RECEIVER) DEVI Use to check blood sugar daily   Continuous Glucose Sensor (DEXCOM G6 SENSOR) MISC USE TO CHECK BLOOD SUGAR DAILY   Continuous Glucose Transmitter (DEXCOM G6 TRANSMITTER) MISC Change every 3 months   dorzolamide-timolol (COSOPT) 22.3-6.8 MG/ML ophthalmic solution Place 1 drop into the right eye 2 (two) times daily.   DULoxetine (CYMBALTA) 30 MG capsule TAKE 1 CAPSULE BY MOUTH EVERY DAY   fluticasone-salmeterol (WIXELA INHUB) 250-50 MCG/ACT AEPB INHALE 1 PUFF INTO THE LUNGS IN THE MORNING AND AT BEDTIME**RINSE AFTER USE**   FREESTYLE LITE test strip USE TO CHECK BLOOD SUGAR 4 TIMES DAILY.   furosemide (LASIX) 20  MG tablet Take 20 mg tablet and 1/2 of the 80 mg tablet once daily to equal 60 mg daily   furosemide (LASIX) 80 MG tablet TAKE 1 TABLET BY MOUTH EVERY DAY (Patient taking differently: Take 40 mg by mouth daily.)   icosapent Ethyl (VASCEPA) 1 g capsule Take 2 capsules (2 g total) by mouth 2 (two) times daily.   Insulin Disposable Pump (OMNIPOD 5 DEXG7G6 PODS GEN 5) MISC CHANGE POD EVERY 3 DAYS   Insulin  Disposable Pump (OMNIPOD 5 G6 INTRO, GEN 5,) KIT 1 kit by Does not apply route daily.   insulin lispro (HUMALOG) 100 UNIT/ML injection USE MAXIMUM 76 UNITS PER   DAY WITH V-GO PUMP   levothyroxine (SYNTHROID) 150 MCG tablet Take 1 tablet (150 mcg total) by mouth daily before breakfast.   losartan (COZAAR) 25 MG tablet Take 1 tablet (25 mg total) by mouth daily.   metoprolol succinate (TOPROL-XL) 50 MG 24 hr tablet TAKE 1 TABLET BY MOUTH EVERY DAY WITH OR IMMEDIATELY FOLLOWING A MEAL   nitroGLYCERIN (NITROSTAT) 0.4 MG SL tablet Place 1 tablet (0.4 mg total) under the tongue every 5 (five) minutes as needed for chest pain.   OVER THE COUNTER MEDICATION Apply 1 application topically daily as needed (pain). Theraworx Pain Cream   polyethylene glycol (MIRALAX / GLYCOLAX) packet Take 8.5 g by mouth daily.   potassium chloride SA (KLOR-CON M20) 20 MEQ tablet Take 40 mEq by mouth in the morning and take 20 mEq in the evening   rosuvastatin (CRESTOR) 20 MG tablet TAKE 1 TAB BY MOUTH EVERYDAY AT BEDTIME   spironolactone (ALDACTONE) 25 MG tablet Take 0.5 tablets (12.5 mg total) by mouth daily.   tirzepatide (MOUNJARO) 7.5 MG/0.5ML Pen Inject 7.5 mg into the skin once a week.   traMADol (ULTRAM) 50 MG tablet TAKE 1 TABLET BY MOUTH EVERY 12 HOURS AS NEEDED   vitamin B-12 (CYANOCOBALAMIN) 1000 MCG tablet Take 1,000 mcg by mouth daily.   [DISCONTINUED] NUPLAZID 34 MG CAPS TAKE 1 CAPSULE BY MOUTH 1 TIME A DAY   No facility-administered encounter medications on file as of 04/05/2023.    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)   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   Dysrhythmia    a-fib   GERD (gastroesophageal reflux disease)    Glaucoma    right eye   History of kidney stones    multiple   HLD (hyperlipidemia)    HTN (hypertension)    pt denies 08/19/12   Hyperplasia, prostate    Hypothyroidism    MI (myocardial infarction) (HCC)    Dr Jens Som 2000, x3vessels bypass   Neuromuscular disorder (HCC)    Parkinson's Disease   OSA (obstructive sleep apnea)    AHI-28,on CPAP, noncompliant with CPAP   Parkinson disease (HCC)    1999   PONV (postoperative nausea and vomiting)    Restless legs  Shortness of breath    Hx: of at all times   UTI (lower urinary tract infection) 09/15/2012   Klebsiella   Ventral hernia    Walker as ambulation aid    also uses wheelchair at home/when going out   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   BIOPSY  10/14/2018   Procedure: BIOPSY;  Surgeon: Beverley Fiedler, MD;  Location: [facility];  Service: Gastroenterology;;   CATARACT EXTRACTION W/ INTRAOCULAR LENS IMPLANT     Hx: of right eye   CATARACT EXTRACTION W/ INTRAOCULAR LENS IMPLANT Left 2018   COLONOSCOPY N/A 10/13/2014   Procedure: COLONOSCOPY;  Surgeon: Beverley Fiedler, MD;  Location: [facility];  Service: Gastroenterology;  Laterality: N/A;   COLONOSCOPY W/ BIOPSIES AND POLYPECTOMY     Hx: of   COLONOSCOPY WITH PROPOFOL N/A 10/14/2018   Procedure: COLONOSCOPY WITH PROPOFOL;  Surgeon: Beverley Fiedler, MD;  Location: [facility];  Service: Gastroenterology;  Laterality: N/A;   CORONARY ARTERY BYPASS GRAFT  2000   Tressie Stalker, MD   CORONARY STENT PLACEMENT  1998   DEEP BRAIN STIMULATOR PLACEMENT  2004   Right and left VIN stimulator placement (parkinsons)   EYE SURGERY     Cataract removed in 1980s related to wood in  eye when 73 years old   FINGER AMPUTATION     left pointer   IMPLANTABLE CARDIOVERTER DEFIBRILLATOR IMPLANT N/A 11/13/2013   Procedure: IMPLANTABLE CARDIOVERTER DEFIBRILLATOR IMPLANT;  Surgeon: Marinus Maw, MD;  Location: [facility] CATH LAB;  Service: Cardiovascular;  Laterality: N/A;   INSERT / REPLACE / REMOVE PACEMAKER     medtronic   LEFT AND RIGHT HEART CATHETERIZATION WITH CORONARY ANGIOGRAM N/A 09/24/2013   Procedure: LEFT AND RIGHT HEART CATHETERIZATION WITH CORONARY ANGIOGRAM;  Surgeon: Kathleene Hazel, MD;  Location: [facility] CATH LAB;  Service: Cardiovascular;  Laterality: N/A;   LITHOTRIPSY     3 different times   POLYPECTOMY  10/14/2018   Procedure: POLYPECTOMY;  Surgeon: Beverley Fiedler, MD;  Location: [facility];  Service: Gastroenterology;;   PULSE GENERATOR IMPLANT Right 11/13/2017   Procedure: Right chest implantable pulse generator change;  Surgeon: Maeola Harman, MD;  Location: [facility] OR;  Service: Neurosurgery;  Laterality: Right;  Right chest implantable pulse generator change   SUBTHALAMIC STIMULATOR BATTERY REPLACEMENT N/A 09/05/2012   Procedure: Deep brain stimulator battery change;  Surgeon: Maeola Harman, MD;  Location: [facility]URO [facility]S;  Service: Neurosurgery;  Laterality: N/A;  Deep brain stimulator battery change   SUBTHALAMIC STIMULATOR BATTERY REPLACEMENT N/A 06/10/2015   Procedure: Deep Brain stimulator battery change;  Surgeon: Maeola Harman, MD;  Location: [facility];  Service: Neurosurgery;  Laterality: N/A;   SUBTHALAMIC STIMULATOR BATTERY REPLACEMENT Right 09/25/2019   Procedure: Deep brain stimulator battery change;  Surgeon: Maeola Harman, MD;  Location: [facility] OR;  Service: Neurosurgery;  Laterality: Right;   SUBTHALAMIC STIMULATOR BATTERY REPLACEMENT Right 12/15/2022   Procedure: IMPLANTABLE PULSE GENERATOR  BATTERY REPLACEMENT;  Surgeon: Bethann Goo, DO;  Location: [facility] OR;  Service: Neurosurgery;  Laterality: Right;  3C   TONSILLECTOMY     Family History   Problem Relation Age of Onset   Aneurysm Mother    Alcoholism Father    HIV/AIDS Brother 23       AIDS   Healthy Sister    Healthy Child    Peripheral vascular disease Other    Arthritis Other  Healthy Sister    Healthy Child    Healthy Child    Diabetes Neg Hx    Heart disease Neg Hx    Colon cancer Neg Hx    Prostate cancer Neg Hx    Social History   Socioeconomic History   Marital status: Married    Spouse name: [name]   Number of children: 2   Years of education: 12   Highest education level: High school graduate  Occupational History   Occupation: DISABLED    Comment: [employer], [employer]  Tobacco Use   Smoking status: Never    Passive exposure: Never   Smokeless tobacco: Never  Vaping Use   Vaping status: Never Used  Substance and Sexual Activity   Alcohol use: Not Currently   Drug use: No   Sexual activity: Not Currently  Other Topics Concern   Not on file  Social History Narrative   From [place]   Retired/disability Archivist   Likes to fish.     Married 1972   3 kids   [place] fan   Social Drivers of Health   Financial Resource Strain: Low Risk  (04/05/2023)   Overall Financial Resource Strain (CARDIA)    Difficulty of Paying Living Expenses: Not hard at all  Food Insecurity: No Food Insecurity (04/05/2023)   Hunger Vital Sign    Worried About Running Out of Food in the Last Year: Never true    Ran Out of Food in the Last Year: Never true  Transportation Needs: No Transportation Needs (04/05/2023)   PRAPARE - Administrator, Civil Service (Medical): No    Lack of Transportation (Non-Medical): No  Physical Activity: Inactive (04/05/2023)   Exercise Vital Sign    Days of Exercise per Week: 0 days    Minutes of Exercise per Session: 0 min  Stress: No Stress Concern Present (04/05/2023)   [employer] of Occupational Health - Occupational Stress Questionnaire    Feeling of Stress : Not at all  Social  Connections: Moderately Integrated (04/05/2023)   Social Connection and Isolation Panel [NHANES]    Frequency of Communication with Friends and Family: More than three times a week    Frequency of Social Gatherings with Friends and Family: More than three times a week    Attends Religious Services: 1 to 4 times per year    Active Member of [employer] or Organizations: No    Attends Engineer, structural: Never    Marital Status: Married    Tobacco Counseling Counseling given: Not Answered  Clinical Intake:  Pre-visit preparation completed: Yes  Pain : No/denies pain    BMI - recorded: 37.8 Nutritional Status: BMI > 30  Obese Nutritional Risks: None Diabetes: Yes CBG done?: Yes (pt has Dexcom BS is 204 now;157 this am) CBG resulted in Enter/ Edit results?: No Did pt. bring in CBG monitor from home?: No  Lab Results  Component Value Date   HGBA1C 6.9 (A) 01/30/2023   HGBA1C 6.5 (A) 10/30/2022   HGBA1C 6.2 (A) 07/31/2022     How often do you need to have someone help you when you read instructions, pamphlets, or other written materials from your doctor or pharmacy?: 1 - Never  Interpreter Needed?: No  Comments: lives with wife Information entered by :: B.Kelseigh Diver,LPN   Activities of Daily Living    Patient Care Team: Joaquim Nam, MD as PCP - General (Family Medicine) Jens Som Madolyn Frieze, MD as PCP - Cardiology (Cardiology)  Pa, [employer], Octaviano Batty, DO as Consulting Physician (Neurology) Kathyrn Sheriff, [place] (Inactive) as Pharmacist (Pharmacist) Bascom Levels, [place], OD (Optometry)  Indicate any recent Medical Services you may have received from other than [facility] providers in the past year (date may be approximate).     Assessment:   This is a routine wellness examination for [facility].  Hearing/Vision screen Hearing Screening - Comments:: Pt says his hearing is ok w/one hearing aid Vision Screening - Comments:: Pt says his vision is ok  with glasses Dr David Stall   Goals Addressed             This Visit's Progress    Monitor and Manage My Blood Sugar-Diabetes Type 2   On track    Timeframe:  Long-Range Goal Priority:  Medium Start Date:    02/08/21                         Expected End Date: 02/08/22                       Follow Up Date April 2023   - check blood sugar at prescribed times - check blood sugar if I feel it is too high or too low - take the blood sugar meter to all doctor visits -Call [facility] to request Freestyle Libre 2    Why is this important?   Checking your blood sugar at home helps to keep it from getting very high or very low.  Writing the results in a diary or log helps the doctor know how to care for you.  Your blood sugar log should have the time, date and the results.  Also, write down the amount of insulin or other medicine that you take.  Other information, like what you ate, exercise done and how you were feeling, will also be helpful.     Notes:      COMPLETED: Patient Stated       08/16/2018, would like to feel better     Patient Stated   Not on track    04/05/23-Would like to have a better diet       Depression Screen     04/05/2023    3:48 PM 02/16/2023   12:55 PM 11/21/2022   12:45 PM 10/10/2022   11:26 AM 09/12/2022    2:58 PM 03/03/2022   12:30 PM 12/28/2021   12:43 PM  PHQ 2/9 Scores  PHQ - 2 Score 0 2 0 0 1 0 0  PHQ- 9 Score     7 0     Fall Risk     04/05/2023    3:45 PM 02/16/2023   12:55 PM 01/15/2023    1:05 PM 11/21/2022   12:35 PM 11/13/2022   12:53 PM  Fall Risk   Falls in the past year? 0 0 0 1 1  Number falls in past yr: 0 0 0 1 1  Injury with Fall? 0 0 0 1 1  Risk for fall due to : No Fall Risks      Follow up Education provided;Falls prevention discussed  Falls evaluation completed      MEDICARE RISK AT HOME:  Medicare Risk at Home Any stairs in or around the home?: No (ramp) If so, are there any without handrails?: No Home free of loose throw  rugs in walkways, pet beds, electrical cords, etc?: Yes Adequate lighting in your home to reduce risk of  falls?: Yes Life alert?: No Use of a cane, walker or w/c?: Yes (walker) Grab bars in the bathroom?: Yes Shower chair or bench in shower?: Yes Elevated toilet seat or a handicapped toilet?: Yes  TIMED UP AND GO:  Was the test performed?  No  Cognitive Function: 6CIT completed    08/16/2018   12:25 PM 04/04/2017    8:59 AM  MMSE - Mini Mental State Exam  Orientation to time 5 5  Orientation to Place 5 5  Registration 3 3  Attention/ Calculation 5 0  Recall 3 2  Recall-comments  unable to recall 1 of 3 words  Language- name 2 objects 0 0  Language- repeat 1 1  Language- follow 3 step command 0 3  Language- read & follow direction 0 0  Write a sentence 0 0  Copy design 0 0  Total score 22 19        04/05/2023    3:54 PM 12/28/2021   12:48 PM  6CIT Screen  What Year? 0 points 0 points  What month? 0 points 0 points  What time? 0 points 0 points  Count back from 20 0 points 0 points  Months in reverse 0 points 0 points  Repeat phrase 0 points 2 points  Total Score 0 points 2 points    Immunizations Immunization History  Administered Date(s) Administered   Fluad Quad(high Dose 65+) 09/18/2018, 10/28/2019, 09/27/2021   Fluad Trivalent(High Dose 65+) 11/01/2022   H1N1 01/22/2008   Influenza Split 12/22/2010, 11/07/2011   Influenza Whole 10/17/2007, 11/16/2008, 11/09/2009   Influenza,inj,Quad PF,6+ Mos 10/01/2014, 09/21/2015, 10/27/2016   Influenza-Unspecified 10/25/2012, 11/27/2013, 09/09/2017, 10/07/2020   PFIZER(Purple Top)SARS-COV-2 Vaccination 05/06/2019, 05/27/2019   Pneumococcal Conjugate-13 03/25/2015   Pneumococcal Polysaccharide-23 01/09/2010, 04/10/2017   Tdap 12/04/2012, 07/21/2020    Screening Tests Health Maintenance  Topic Date Due   Zoster Vaccines- Shingrix (1 of 2) Never done   Colonoscopy  10/13/2021   COVID-19 Vaccine (3 - 2024-25 season)  09/10/2022   HEMOGLOBIN A1C  07/30/2023   FOOT EXAM  07/31/2023   Diabetic kidney evaluation - Urine ACR  10/24/2023   Diabetic kidney evaluation - eGFR measurement  12/11/2023   OPHTHALMOLOGY EXAM  03/06/2024   Medicare Annual Wellness (AWV)  04/04/2024   DTaP/Tdap/Td (3 - Td or Tdap) 07/22/2030   Pneumonia Vaccine 49+ Years old  Completed   INFLUENZA VACCINE  Completed   Hepatitis C Screening  Completed   HPV VACCINES  Aged Out    Health Maintenance  Health Maintenance Due  Topic Date Due   Zoster Vaccines- Shingrix (1 of 2) Never done   Colonoscopy  10/13/2021   COVID-19 Vaccine (3 - 2024-25 season) 09/10/2022   Health Maintenance Items Addressed: None-will talk with PCP BOUT COLONOSCOPY tomorrow T VISIT  Additional Screening:  Vision Screening: Recommended annual ophthalmology exams for early detection of glaucoma and other disorders of the eye.  Dental Screening: Recommended annual dental exams for proper oral hygiene  Community Resource Referral / Chronic Care Management: CRR required this visit?  No   CCM required this visit?  No    Plan:     I have personally reviewed and noted the following in the patient's chart:   Medical and social history Use of alcohol, tobacco or illicit drugs  Current medications and supplements including opioid prescriptions. Patient is not currently taking opioid prescriptions. Functional ability and status Nutritional status Physical activity Advanced directives List of other physicians Hospitalizations, surgeries, and ER visits in  previous 12 months Vitals Screenings to include cognitive, depression, and falls Referrals and appointments  In addition, I have reviewed and discussed with patient certain preventive protocols, quality metrics, and best practice recommendations. A written personalized care plan for preventive services as well as general preventive health recommendations were provided to patient.    Sue Lush, LPN   1/61/0960   After Visit Summary: (MyChart) Due to this being a telephonic visit, the after visit summary with patients personalized plan was offered to patient via MyChart   Notes: Nothing significant to report at this time.

## 2023-04-06 ENCOUNTER — Ambulatory Visit: Payer: Medicare Other | Admitting: Family Medicine

## 2023-04-06 VITALS — BP 122/70 | HR 71 | Temp 98.2°F | Ht 69.0 in | Wt 256.0 lb

## 2023-04-06 DIAGNOSIS — K439 Ventral hernia without obstruction or gangrene: Secondary | ICD-10-CM

## 2023-04-06 DIAGNOSIS — Z7189 Other specified counseling: Secondary | ICD-10-CM

## 2023-04-06 DIAGNOSIS — H6120 Impacted cerumen, unspecified ear: Secondary | ICD-10-CM | POA: Diagnosis not present

## 2023-04-06 DIAGNOSIS — M109 Gout, unspecified: Secondary | ICD-10-CM | POA: Diagnosis not present

## 2023-04-06 DIAGNOSIS — Z0000 Encounter for general adult medical examination without abnormal findings: Secondary | ICD-10-CM

## 2023-04-06 DIAGNOSIS — R300 Dysuria: Secondary | ICD-10-CM

## 2023-04-06 DIAGNOSIS — E7800 Pure hypercholesterolemia, unspecified: Secondary | ICD-10-CM | POA: Diagnosis not present

## 2023-04-06 DIAGNOSIS — K5900 Constipation, unspecified: Secondary | ICD-10-CM | POA: Diagnosis not present

## 2023-04-06 DIAGNOSIS — R0602 Shortness of breath: Secondary | ICD-10-CM | POA: Diagnosis not present

## 2023-04-06 DIAGNOSIS — E119 Type 2 diabetes mellitus without complications: Secondary | ICD-10-CM

## 2023-04-06 DIAGNOSIS — I10 Essential (primary) hypertension: Secondary | ICD-10-CM

## 2023-04-06 DIAGNOSIS — G20A1 Parkinson's disease without dyskinesia, without mention of fluctuations: Secondary | ICD-10-CM

## 2023-04-06 LAB — POC URINALSYSI DIPSTICK (AUTOMATED)
Bilirubin, UA: NEGATIVE
Blood, UA: NEGATIVE
Glucose, UA: NEGATIVE
Ketones, UA: NEGATIVE
Leukocytes, UA: NEGATIVE
Nitrite, UA: NEGATIVE
Protein, UA: NEGATIVE
Spec Grav, UA: 1.02 (ref 1.010–1.025)
Urobilinogen, UA: 0.2 U/dL
pH, UA: 6 (ref 5.0–8.0)

## 2023-04-06 LAB — CBC WITH DIFFERENTIAL/PLATELET
Basophils Absolute: 0 10*3/uL (ref 0.0–0.1)
Basophils Relative: 0.5 % (ref 0.0–3.0)
Eosinophils Absolute: 0.2 10*3/uL (ref 0.0–0.7)
Eosinophils Relative: 2.5 % (ref 0.0–5.0)
HCT: 42.4 % (ref 39.0–52.0)
Hemoglobin: 14 g/dL (ref 13.0–17.0)
Lymphocytes Relative: 27 % (ref 12.0–46.0)
Lymphs Abs: 1.9 10*3/uL (ref 0.7–4.0)
MCHC: 33.1 g/dL (ref 30.0–36.0)
MCV: 88.6 fl (ref 78.0–100.0)
Monocytes Absolute: 0.5 10*3/uL (ref 0.1–1.0)
Monocytes Relative: 7.8 % (ref 3.0–12.0)
Neutro Abs: 4.3 10*3/uL (ref 1.4–7.7)
Neutrophils Relative %: 62.2 % (ref 43.0–77.0)
Platelets: 147 10*3/uL — ABNORMAL LOW (ref 150.0–400.0)
RBC: 4.78 Mil/uL (ref 4.22–5.81)
RDW: 14.3 % (ref 11.5–15.5)
WBC: 6.9 10*3/uL (ref 4.0–10.5)

## 2023-04-06 LAB — COMPREHENSIVE METABOLIC PANEL WITH GFR
ALT: 2 U/L (ref 0–53)
AST: 6 U/L (ref 0–37)
Albumin: 4 g/dL (ref 3.5–5.2)
Alkaline Phosphatase: 69 U/L (ref 39–117)
BUN: 25 mg/dL — ABNORMAL HIGH (ref 6–23)
CO2: 33 meq/L — ABNORMAL HIGH (ref 19–32)
Calcium: 8.8 mg/dL (ref 8.4–10.5)
Chloride: 99 meq/L (ref 96–112)
Creatinine, Ser: 1.35 mg/dL (ref 0.40–1.50)
GFR: 52.76 mL/min — ABNORMAL LOW (ref >60.00)
Glucose, Bld: 153 mg/dL — ABNORMAL HIGH (ref 70–99)
Potassium: 4.1 meq/L (ref 3.5–5.1)
Sodium: 138 meq/L (ref 135–145)
Total Bilirubin: 0.5 mg/dL (ref 0.2–1.2)
Total Protein: 6.5 g/dL (ref 6.0–8.3)

## 2023-04-06 LAB — LIPID PANEL
Cholesterol: 96 mg/dL (ref 0–200)
HDL: 30.1 mg/dL — ABNORMAL LOW (ref >39.00)
LDL Cholesterol: 45 mg/dL (ref 0–99)
NonHDL: 65.93
Total CHOL/HDL Ratio: 3
Triglycerides: 106 mg/dL (ref 0.0–149.0)
VLDL: 21.2 mg/dL (ref 0.0–40.0)

## 2023-04-06 LAB — BRAIN NATRIURETIC PEPTIDE: Pro B Natriuretic peptide (BNP): 27 pg/mL (ref 0.0–100.0)

## 2023-04-06 NOTE — Patient Instructions (Addendum)
 Don't use q tips.  Gently run warm water in the ear canal in the shower and then let it dry.  You could use debrox prior to a shower.    You could ask the pharmacy about the RSV and shingles vaccines.   Go to the lab on the way out.   If you have mychart we'll likely use that to update you.     Take care.  Glad to see you.

## 2023-04-06 NOTE — Progress Notes (Signed)
 He had burning with urination prev but not today.  Urinalysis unremarkable, discussed with patient at office visit.  Parkinson's. Per neuro.  Using rollator.  Compliant with meds.  No falls.  He is careful about avoiding falls.  DM per endo.  Prev MALB per endo.  No change in plan since already on ARB.  Discussed.  Ear sx.  L ear feels full, with wax in the canals.  Discussed using Debrox at home if needed, along with irrigation in the shower.  Umbilical hernia.  Mildly tender prev but not today.  D/w pt about options, he can update me as needed.  He is not at the point of wanting intervention.  Constipation.  Had tried OTC miralax.  Tried senokot.  Was having hard stools.  D/w pt about using senokot with miralax.    Gout.  No recent colchicine use with allopurinol.   Compliant with allopurinol.  No recent flares.  Hypertension:    Using medication without problems or lightheadedness: yes Chest pain with exertion:no Edema:no Short of breath: some with exertion at baseline.  Not worse than prior.    Elevated Cholesterol: Using medications without problems:yes Muscle aches: no  RSV vaccine d/w pt.  Vaccines d/w pt.   Colonoscopy 2025.   Advance directive- wife designated if patient were incapacitated.   Meds, vitals, and allergies reviewed.   ROS: Per HPI unless specifically indicated in ROS section   Nad Ncat Cerumen in both canals but tympanic membranes partially visible and without erythema MMM Neck supple, no LA Rrr Ctab Abd soft not ttp, soft umbilical hernia noted. Skin well-perfused. Speech pacing slightly slow, at baseline.

## 2023-04-08 ENCOUNTER — Encounter: Payer: Self-pay | Admitting: Family Medicine

## 2023-04-08 DIAGNOSIS — Z0000 Encounter for general adult medical examination without abnormal findings: Secondary | ICD-10-CM | POA: Insufficient documentation

## 2023-04-08 DIAGNOSIS — H6120 Impacted cerumen, unspecified ear: Secondary | ICD-10-CM | POA: Insufficient documentation

## 2023-04-08 NOTE — Assessment & Plan Note (Signed)
 Advance directive- wife designated if patient were incapacitated.

## 2023-04-08 NOTE — Assessment & Plan Note (Signed)
 Prev MALB per endo.  No change in plan since already on ARB.  Discussed.  No change in medications at this point.

## 2023-04-08 NOTE — Assessment & Plan Note (Signed)
 D/w pt about using senokot with miralax.  Update me as needed.

## 2023-04-08 NOTE — Assessment & Plan Note (Signed)
 Can try using Debrox and irrigation at home in the shower.  Avoid using Q-tips.

## 2023-04-08 NOTE — Assessment & Plan Note (Signed)
 Would not intervene unless more symptomatic.  Routine cautions given to patient.

## 2023-04-08 NOTE — Assessment & Plan Note (Signed)
 Continue allopurinol as is.  No recent flares.  No recent colchicine use.

## 2023-04-08 NOTE — Assessment & Plan Note (Signed)
 Unremarkable urinalysis and no symptoms today.  Can update me as needed.

## 2023-04-08 NOTE — Assessment & Plan Note (Signed)
 Per neurology, using rollator.  Continue carbidopa levodopa.

## 2023-04-08 NOTE — Assessment & Plan Note (Signed)
 Continue Crestor

## 2023-04-08 NOTE — Assessment & Plan Note (Signed)
 RSV vaccine d/w pt.  Vaccines d/w pt.   Colonoscopy 2025.   Advance directive- wife designated if patient were incapacitated.

## 2023-04-08 NOTE — Assessment & Plan Note (Signed)
 Continue spironolactone potassium metoprolol losartan and Lasix.  Unclear source regarding shortness of breath with exertion, likely multifactorial.  See notes on labs.  Lungs are clear.  Okay for outpatient follow-up.

## 2023-04-10 ENCOUNTER — Other Ambulatory Visit: Payer: Self-pay | Admitting: Family Medicine

## 2023-04-18 ENCOUNTER — Ambulatory Visit (INDEPENDENT_AMBULATORY_CARE_PROVIDER_SITE_OTHER): Payer: Medicare Other

## 2023-04-18 DIAGNOSIS — I255 Ischemic cardiomyopathy: Secondary | ICD-10-CM | POA: Diagnosis not present

## 2023-04-22 LAB — CUP PACEART REMOTE DEVICE CHECK
Battery Remaining Longevity: 15 mo
Battery Voltage: 2.88 V
Brady Statistic AP VP Percent: 0 %
Brady Statistic AP VS Percent: 1.8 %
Brady Statistic AS VP Percent: 0.06 %
Brady Statistic AS VS Percent: 98.13 %
Brady Statistic RA Percent Paced: 1.8 %
Brady Statistic RV Percent Paced: 0.06 %
Date Time Interrogation Session: 20250411121339
HighPow Impedance: 71 Ohm
Implantable Lead Connection Status: 753985
Implantable Lead Connection Status: 753985
Implantable Lead Implant Date: 20151105
Implantable Lead Implant Date: 20151105
Implantable Lead Location: 753859
Implantable Lead Location: 753860
Implantable Lead Model: 5076
Implantable Pulse Generator Implant Date: 20151105
Lead Channel Impedance Value: 437 Ohm
Lead Channel Impedance Value: 456 Ohm
Lead Channel Impedance Value: 494 Ohm
Lead Channel Pacing Threshold Amplitude: 0.375 V
Lead Channel Pacing Threshold Amplitude: 1.125 V
Lead Channel Pacing Threshold Pulse Width: 0.4 ms
Lead Channel Pacing Threshold Pulse Width: 0.4 ms
Lead Channel Sensing Intrinsic Amplitude: 1.75 mV
Lead Channel Sensing Intrinsic Amplitude: 1.75 mV
Lead Channel Sensing Intrinsic Amplitude: 18.125 mV
Lead Channel Sensing Intrinsic Amplitude: 18.125 mV
Lead Channel Setting Pacing Amplitude: 2.25 V
Lead Channel Setting Pacing Amplitude: 2.5 V
Lead Channel Setting Pacing Pulse Width: 0.4 ms
Lead Channel Setting Sensing Sensitivity: 0.3 mV
Zone Setting Status: 755011

## 2023-04-23 ENCOUNTER — Encounter: Payer: Self-pay | Admitting: Neurology

## 2023-04-24 ENCOUNTER — Encounter: Payer: Self-pay | Admitting: Internal Medicine

## 2023-04-30 ENCOUNTER — Encounter: Payer: Self-pay | Admitting: Endocrinology

## 2023-04-30 ENCOUNTER — Ambulatory Visit (INDEPENDENT_AMBULATORY_CARE_PROVIDER_SITE_OTHER): Payer: Medicare Other | Admitting: Endocrinology

## 2023-04-30 VITALS — BP 122/70 | HR 68 | Resp 20 | Ht 69.0 in | Wt 256.6 lb

## 2023-04-30 DIAGNOSIS — Z794 Long term (current) use of insulin: Secondary | ICD-10-CM

## 2023-04-30 DIAGNOSIS — E890 Postprocedural hypothyroidism: Secondary | ICD-10-CM | POA: Diagnosis not present

## 2023-04-30 DIAGNOSIS — E118 Type 2 diabetes mellitus with unspecified complications: Secondary | ICD-10-CM

## 2023-04-30 LAB — POCT GLYCOSYLATED HEMOGLOBIN (HGB A1C): Hemoglobin A1C: 6.8 % — AB (ref 4.0–5.6)

## 2023-04-30 NOTE — Progress Notes (Signed)
 Outpatient Endocrinology Note Henry Zayli Villafuerte, MD  05/01/23  Patient's Name: Henry [name]    DOB: Mar 12, 1950    MRN: [id number]                                                    REASON OF VISIT: Follow-up for type 2 diabetes mellitus  PCP: Donnie Galea, MD  HISTORY OF PRESENT ILLNESS:   Henry [name] is a 73 y.o. old male with past medical history listed below, is here for follow up of  type 2 diabetes mellitus / hypothyroidism.    Pertinent Diabetes History: Patient was diagnosed with type 2 diabetes mellitus in 2000.  He has been on OmniPod 5 insulin  pump with Dexcom G6.  Patient's wife is helping with insulin  pump management.  Patient has severe Parkinson's disease.  Chronic Diabetes Complications : Retinopathy: no. Last ophthalmology exam was done on annually reportedly. Nephropathy: CKD, on losartan , following with nephrology. Peripheral neuropathy:  yes, taking gabapentin  from PCP.  Has history of numbness in his toes. Coronary artery disease: CAD. Has CHF Stroke: no.  He has Parkinson's disease.  Relevant comorbidities and cardiovascular risk factors: Obesity: yes Body mass index is 37.89 kg/m.  Hypertension: yes Hyperlipidemia. Yes, on statin.   Current / Home Diabetic regimen includes:  Mounjaro  7.5 mg weekly.  OmniPod 5 insulin  Pump setting: Using Humalog  U100.  Wife helps him to manage insulin  pump at home. Basal ( 49.95) MN- 1.1 u/hour 3:30AM-1.1  6AM- 1.3 12PM- 1.3 10PM - 1.1  Bolus CHO Ratio (1unit:CHO) MN- 1:10  Generally Boluses 3-5 units for breakfast, 4-6 lunch and 6-7 units for dinner, 2-3 units for snacks . Generally entering about 45 -55- 65 g of carbohydrate for the 3 meals   Correction/Sensitivity: MN- 1:60  Target: 120   Active insulin  time: 2 hours  Prior diabetic medications: Amaryl  several years ago and probably metformin .  Janumet .  Victoza .  Lantus .  V-Go pump in 2016. Farxiga  was stopped when he had UTI in May  2022.  CONTINUOUS GLUCOSE MONITORING SYSTEM (CGMS) / INSULIN  PUMP INTERPRETATION:                         OmniPod 5 Pump & DEXCOM G6 Sensor Download (Reviewed and summarized below.) Pump: Dexcom G6 and OmniPod 5 Dates: April 8 to April 21 , 2025 for 14 days.   Average total daily insulin :  38.9 units, Basal: 66% ( 30.5 units), Bolus: 34%.   Automated mode in Use: 81%     Interpretation: Mostly acceptable blood sugar.  Occasional hypoglycemia with blood sugar up to 200 range related to no and late meal boluses.  Blood sugars in between the meals and overnight are acceptable.  With on time meal bolus no hypo or hyperglycemia.  No concerning hyperglycemia.  Hypoglycemia: Patient has no hypoglycemic episodes. Patient has hypoglycemia awareness,.    Factors modifying glucose control: 1.  Diabetic diet assessment: 3 meals a day.  2.  Staying active or exercising: Not able to exercise due to medical condition.  3.  Medication compliance: compliant all of the time.  # Postablative hypothyroidism.  Patient has hypothyroidism secondary to radioactive iodine treatment for hyperthyroidism in 2013.   Interval history  Insulin  pump and CGM data as reviewed above.  Hemoglobin A1c  6.8% today.  Mostly acceptable blood sugar.  No new complaints.  He has been tolerating Mounjaro  well.  He has been taking levothyroxine  150 mcg daily.  He is accompanied by his wife in the clinic today.  REVIEW OF SYSTEMS As per history of present illness.   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)   DM (diabetes mellitus) (HCC)    TYPE 2 -  insulin  pump   Dysrhythmia    a-fib   GERD (gastroesophageal reflux disease)    Glaucoma    right eye   History of kidney stones    multiple   HLD (hyperlipidemia)    HTN (hypertension)    pt denies 08/19/12   Hyperplasia, prostate    Hypothyroidism    MI (myocardial infarction) (HCC)    Dr Audery Blazing 2000, [id number] bypass   Neuromuscular disorder (HCC)    Parkinson's Disease   OSA (obstructive sleep apnea)    AHI-28,on CPAP, noncompliant with CPAP   Parkinson disease (HCC)    1999   PONV (postoperative nausea and vomiting)    Restless legs    Shortness of breath    Hx: of at all times   UTI (lower urinary tract infection) 09/15/2012   Klebsiella   Ventral hernia    Walker as ambulation aid    also uses wheelchair at home/when going out    PAST SURGICAL HISTORY: 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   BIOPSY  10/14/2018   Procedure: BIOPSY;  Surgeon: Nannette Babe, MD;  Location: [facility];  Service: Gastroenterology;;   CATARACT EXTRACTION W/ INTRAOCULAR LENS IMPLANT     Hx: of right eye   CATARACT EXTRACTION W/ INTRAOCULAR LENS IMPLANT Left 2018   COLONOSCOPY N/A 10/13/2014   Procedure: COLONOSCOPY;  Surgeon: Nannette Babe, MD;  Location: [facility];  Service: Gastroenterology;  Laterality: N/A;   COLONOSCOPY W/ BIOPSIES AND POLYPECTOMY     Hx: of   COLONOSCOPY WITH PROPOFOL  N/A 10/14/2018   Procedure: COLONOSCOPY WITH PROPOFOL ;  Surgeon: Nannette Babe, MD;  Location: [facility];  Service: Gastroenterology;  Laterality: N/A;   CORONARY ARTERY BYPASS GRAFT  2000   Courtland Bureau, MD   CORONARY STENT PLACEMENT  1998   DEEP BRAIN STIMULATOR PLACEMENT  2004   Right and left VIN stimulator placement (parkinsons)   EYE SURGERY     Cataract removed in 1980s related to wood in eye when 73 years old   FINGER AMPUTATION     left pointer   IMPLANTABLE CARDIOVERTER DEFIBRILLATOR IMPLANT N/A 11/13/2013    Procedure: IMPLANTABLE CARDIOVERTER DEFIBRILLATOR IMPLANT;  Surgeon: Tammie Fall, MD;  Location: [facility];  Service: Cardiovascular;  Laterality: N/A;   INSERT / REPLACE / REMOVE PACEMAKER     medtronic   LEFT AND RIGHT HEART CATHETERIZATION WITH CORONARY ANGIOGRAM N/A 09/24/2013   Procedure: LEFT AND RIGHT HEART CATHETERIZATION WITH CORONARY ANGIOGRAM;  Surgeon: Odie Benne, MD;  Location:  [facility];  Service: Cardiovascular;  Laterality: N/A;   LITHOTRIPSY     3 different times   POLYPECTOMY  10/14/2018   Procedure: POLYPECTOMY;  Surgeon: Nannette Babe, MD;  Location: [facility];  Service: Gastroenterology;;   PULSE GENERATOR IMPLANT Right 11/13/2017   Procedure: Right chest implantable pulse generator change;  Surgeon: Manya Sells, MD;  Location[facility];  Service: Neurosurgery;  Laterality: Right;  Right chest implantable pulse generator change   SUBTHALAMIC STIMULATOR BATTERY REPLACEMENT N/A 09/05/2012   Procedure: Deep brain stimulator battery change;  Surgeon: Manya Sells, MD;  Location: [facility]URO ORS;  Service: Neurosurgery;  Laterality: N/A;  Deep brain stimulator battery change   SUBTHALAMIC STIMULATOR BATTERY REPLACEMENT N/A 06/10/2015   Procedure: Deep Brain stimulator battery change;  Surgeon: Manya Sells, MD;  Location: [facility];  Service: Neurosurgery;  Laterality: N/A;   SUBTHALAMIC STIMULATOR BATTERY REPLACEMENT Right 09/25/2019   Procedure: Deep brain stimulator battery change;  Surgeon: Manya Sells, MD;  Location[facility];  Service: Neurosurgery;  Laterality: Right;   SUBTHALAMIC STIMULATOR BATTERY REPLACEMENT Right 12/15/2022   Procedure: IMPLANTABLE PULSE GENERATOR  BATTERY REPLACEMENT;  Surgeon: Pincus Bridgeman, DO;  Location: [facility] OR;  Service: Neurosurgery;  Laterality: Right;  3C   TONSILLECTOMY      ALLERGIES: Allergies  Allergen Reactions   Penicillins Anaphylaxis and Rash    Because of a history of documented adverse serious drug  reaction;Medi Alert bracelet  is recommended PATIENT HAS HAD A PCN REACTION WITH IMMEDIATE RASH, FACIAL/TONGUE/THROAT SWELLING, SOB, OR LIGHTHEADEDNESS WITH HYPOTENSION:  #  #  YES  #  #  Has patient had a PCN reaction causing severe rash involving mucus membranes or skin necrosis: unknown Has patient had a PCN reaction that required hospitalization NO Has patient had a PCN reaction occurring within the last 10 years: NO   Klonopin  [Clonazepam ] Other (See Comments)    agitation   Peanut-Containing Drug Products Cough   Watermelon [Citrullus Vulgaris] Other (See Comments) and Cough    Tickle in throat    FAMILY HISTORY:  Family History  Problem Relation Age of Onset   Aneurysm Mother    Alcoholism Father    HIV/AIDS Brother 39       AIDS   Healthy Sister    Healthy Child    Peripheral vascular disease Other    Arthritis Other    Healthy Sister    Healthy Child    Healthy Child    Diabetes Neg Hx    Heart disease Neg Hx    Colon cancer Neg Hx    Prostate cancer Neg Hx     SOCIAL HISTORY: Social History   Socioeconomic History   Marital status: Married    Spouse name: [name]   Number of children: 2   Years of education: 12   Highest education level: High school graduate  Occupational History   Occupation: DISABLED    Comment: [employer], [employer]  Tobacco Use   Smoking status: Never    Passive exposure: Never   Smokeless tobacco: Never  Vaping Use   Vaping status: Never Used  Substance and Sexual Activity   Alcohol use: Not Currently   Drug use: No   Sexual activity: Not Currently  Other Topics Concern   Not on file  Social History Narrative   From [place]   Retired/disability Archivist   Likes to fish.     Married 1972   3 kids   [place] fan  Social Drivers of Corporate investment banker Strain: Low Risk  (04/05/2023)   Overall Financial Resource Strain (CARDIA)    Difficulty of Paying Living Expenses: Not hard at all  Food  Insecurity: No Food Insecurity (04/05/2023)   Hunger Vital Sign    Worried About Running Out of Food in the Last Year: Never true    Ran Out of Food in the Last Year: Never true  Transportation Needs: No Transportation Needs (04/05/2023)   PRAPARE - Administrator, Civil Service (Medical): No    Lack of Transportation (Non-Medical): No  Physical Activity: Inactive (04/05/2023)   Exercise Vital Sign    Days of Exercise per Week: 0 days    Minutes of Exercise per Session: 0 min  Stress: No Stress Concern Present (04/05/2023)   [employer] of Occupational Health - Occupational Stress Questionnaire    Feeling of Stress : Not at all  Social Connections: Moderately Integrated (04/05/2023)   Social Connection and Isolation Panel [NHANES]    Frequency of Communication with Friends and Family: More than three times a week    Frequency of Social Gatherings with Friends and Family: More than three times a week    Attends Religious Services: 1 to 4 times per year    Active Member of [employer] or Organizations: No    Attends Banker Meetings: Never    Marital Status: Married    MEDICATIONS:  Current Outpatient Medications  Medication Sig Dispense Refill   acetaminophen  (TYLENOL ) 500 MG tablet Take 1,000 mg by mouth at bedtime as needed for mild pain or headache.      albuterol  (VENTOLIN  HFA) 108 (90 Base) MCG/ACT inhaler TAKE 2 PUFFS BY MOUTH EVERY 6 HOURS AS NEEDED FOR WHEEZE OR SHORTNESS OF BREATH 18 each 2   allopurinol  (ZYLOPRIM ) 100 MG tablet TAKE 1 AND 1/2 TABLETS BY MOUTH DAILY 135 tablet 3   aspirin  EC 81 MG tablet Take 1 tablet (81 mg total) by mouth daily. Swallow whole.     Carbidopa -Levodopa  ER (SINEMET  CR) 25-100 MG tablet controlled release TAKE 2 TABLETS BY MOUTH 5 TIMES PER DAY AS DIRECTED 900 tablet 0   Cholecalciferol (VITAMIN D) 50 MCG (2000 UT) tablet Take 2,000 Units by mouth daily.      colchicine  0.6 MG tablet Take 1 tablet (0.6 mg total) by mouth  daily as needed (gout).     Continuous Blood Gluc Receiver (DEXCOM G6 RECEIVER) DEVI Use to check blood sugar daily 1 each 0   Continuous Glucose Sensor (DEXCOM G6 SENSOR) MISC USE TO CHECK BLOOD SUGAR DAILY 9 each 3   Continuous Glucose Transmitter (DEXCOM G6 TRANSMITTER) MISC Change every 3 months 1 each 3   dorzolamide-timolol (COSOPT) 22.3-6.8 MG/ML ophthalmic solution Place 1 drop into the right eye 2 (two) times daily.     DULoxetine  (CYMBALTA ) 30 MG capsule TAKE 1 CAPSULE BY MOUTH EVERY DAY 90 capsule 0   fluticasone -salmeterol (WIXELA INHUB) 250-50 MCG/ACT AEPB INHALE 1 PUFF INTO THE LUNGS IN THE MORNING AND AT BEDTIME**RINSE AFTER USE** 180 each 1   FREESTYLE LITE test strip USE TO CHECK BLOOD SUGAR 4 TIMES DAILY. 150 strip 3   furosemide  (LASIX ) 20 MG tablet Take 20 mg tablet and 1/2 of the 80 mg tablet once daily to equal 60 mg daily 90 tablet 3   furosemide  (LASIX ) 80 MG tablet TAKE 1 TABLET BY MOUTH EVERY DAY (Patient taking differently: Take 40 mg by mouth daily.) 90 tablet 1  icosapent  Ethyl (VASCEPA ) 1 g capsule Take 2 capsules (2 g total) by mouth 2 (two) times daily. 360 capsule 3   Insulin  Disposable Pump (OMNIPOD 5 DEXG7G6 PODS GEN 5) MISC CHANGE POD EVERY 3 DAYS 30 each 3   Insulin  Disposable Pump (OMNIPOD 5 G6 INTRO, GEN 5,) KIT 1 kit by Does not apply route daily. 1 kit 0   insulin  lispro (HUMALOG ) 100 UNIT/ML injection USE MAXIMUM 76 UNITS PER   DAY WITH V-GO PUMP 60 mL 3   levothyroxine  (SYNTHROID ) 150 MCG tablet Take 1 tablet (150 mcg total) by mouth daily before breakfast. 90 tablet 3   losartan  (COZAAR ) 25 MG tablet Take 1 tablet (25 mg total) by mouth daily. 90 tablet 2   metoprolol  succinate (TOPROL -XL) 50 MG 24 hr tablet TAKE 1 TABLET BY MOUTH EVERY DAY WITH OR IMMEDIATELY FOLLOWING A MEAL 90 tablet 1   NUPLAZID  34 MG CAPS TAKE 1 CAPSULE BY MOUTH 1 TIME A DAY 90 capsule 0   OVER THE COUNTER MEDICATION Apply 1 application topically daily as needed (pain). Theraworx  Pain Cream     polyethylene glycol (MIRALAX / GLYCOLAX) packet Take 8.5 g by mouth daily.     potassium chloride  SA (KLOR-CON  M20) 20 MEQ tablet Take 40 mEq by mouth in the morning and take 20 mEq in the evening 270 tablet 0   rosuvastatin  (CRESTOR ) 20 MG tablet TAKE 1 TAB BY MOUTH EVERYDAY AT BEDTIME 90 tablet 3   sennosides-docusate sodium  (SENOKOT-S) 8.6-50 MG tablet Take 1 tablet by mouth daily as needed for constipation. Take along with miralax.     tirzepatide  (MOUNJARO ) 7.5 MG/0.5ML Pen Inject 7.5 mg into the skin once a week. 6 mL 3   traMADol  (ULTRAM ) 50 MG tablet TAKE 1 TABLET BY MOUTH EVERY 12 HOURS AS NEEDED 60 tablet 1   vitamin B-12 (CYANOCOBALAMIN ) 1000 MCG tablet Take 1,000 mcg by mouth daily.     nitroGLYCERIN  (NITROSTAT ) 0.4 MG SL tablet Place 1 tablet (0.4 mg total) under the tongue every 5 (five) minutes as needed for chest pain. 25 tablet 12   spironolactone  (ALDACTONE ) 25 MG tablet Take 0.5 tablets (12.5 mg total) by mouth daily. 45 tablet 3   No current facility-administered medications for this visit.    PHYSICAL EXAM: Vitals:   04/30/23 1410  BP: 122/70  Pulse: 68  Resp: 20  SpO2: 96%  Weight: 256 lb 9.6 oz (116.4 kg)  Height: 5\' 9"  (1.753 m)    Body mass index is 37.89 kg/m.  Wt Readings from Last 3 Encounters:  04/30/23 256 lb 9.6 oz (116.4 kg)  04/06/23 256 lb (116.1 kg)  04/05/23 256 lb (116.1 kg)    General: Well developed, well nourished male in no apparent distress.  HEENT: AT/[place], no external lesions.  Eyes: Conjunctiva clear and no icterus. Neck: Neck supple  Lungs: Respirations not labored Neurologic: Alert, oriented, normal speech Extremities / Skin: Dry.  Psychiatric: Does not appear depressed or anxious  Diabetic Foot Exam - Simple   No data filed    LABS Reviewed Lab Results  Component Value Date   HGBA1C 6.8 (A) 04/30/2023   HGBA1C 6.9 (A) 01/30/2023   HGBA1C 6.5 (A) 10/30/2022   Lab Results  Component Value Date    FRUCTOSAMINE 359 (H) 07/01/2019   FRUCTOSAMINE 277 11/09/2017   FRUCTOSAMINE 350 (H) 03/29/2017   Lab Results  Component Value Date   CHOL 96 04/06/2023   HDL 30.10 (L) 04/06/2023   LDLCALC 45  04/06/2023   LDLDIRECT 51.0 04/24/2019   TRIG 106.0 04/06/2023   CHOLHDL 3 04/06/2023   Lab Results  Component Value Date   MICRALBCREAT 11.0 10/24/2022   MICRALBCREAT 2.2 02/04/2021   Lab Results  Component Value Date   CREATININE 1.35 04/06/2023   Lab Results  Component Value Date   GFR 52.76 (L) 04/06/2023    ASSESSMENT / PLAN  1. Controlled type 2 diabetes mellitus with complication, with long-term current use of insulin  (HCC)   2. Hypothyroidism following radioiodine therapy      Diabetes Mellitus type 2, complicated by diabetic neuropathy/CKD//CAD - Diabetic status / severity: Controlled.  Lab Results  Component Value Date   HGBA1C 6.8 (A) 04/30/2023    - Hemoglobin A1c goal <7%   - Medications: No change in the pump setting today.  Advised to meal for bolus before eating, ideally 15 minutes before.  -Continue Mounjaro  7.5 mg weekly.  - Home glucose testing: continue CGM /Dexcom G6 and check blood glucose as needed.  - Discussed/ Gave Hypoglycemia treatment plan.  # Consult : not required at this time.   # Annual urine for microalbuminuria/ creatinine ratio, no microalbuminuria currently, continue ACE/ARB /losartan .  Following with nephrology.   Last  Lab Results  Component Value Date   MICRALBCREAT 11.0 10/24/2022    # Foot check nightly / neuropathy, continue gabapentin  per PCP.  # Annual dilated diabetic eye exams.   - Diet: Make healthy diabetic food choices.  2. Blood pressure  -  BP Readings from Last 1 Encounters:  04/30/23 122/70    - Control is in target.  - No change in current plans.  3. Lipid status / Hyperlipidemia - Last  Lab Results  Component Value Date   LDLCALC 45 04/06/2023   - Continue rosuvastatin  20 mg daily, continue  Vascepa .  # Postablative hypothyroidism. -Patient is currently taking levothyroxine  150 mcg daily.  Continue the same.  Diagnoses and all orders for this visit:  Controlled type 2 diabetes mellitus with complication, with long-term current use of insulin  (HCC) -     POCT glycosylated hemoglobin (Hb A1C)  Hypothyroidism following radioiodine therapy     DISPOSITION Follow up in clinic in 3  months suggested.   All questions answered and patient verbalized understanding of the plan.  Henry Manav Pierotti, MD [facility] [address] [place], [address] [place], [place] [address] Phone # [phone]  At least part of this note was generated using voice recognition software. Inadvertent word errors may have occurred, which were not recognized during the proofreading process.

## 2023-05-01 ENCOUNTER — Encounter: Payer: Self-pay | Admitting: Endocrinology

## 2023-05-11 ENCOUNTER — Encounter: Attending: Physical Medicine & Rehabilitation | Admitting: Physical Medicine & Rehabilitation

## 2023-05-11 ENCOUNTER — Encounter: Payer: Self-pay | Admitting: Physical Medicine & Rehabilitation

## 2023-05-11 DIAGNOSIS — G248 Other dystonia: Secondary | ICD-10-CM | POA: Diagnosis not present

## 2023-05-11 NOTE — Progress Notes (Signed)
   Established Patient Office Visit  Subjective   Patient ID: Henry [name], male    DOB: [birth year]  Age: 73 y.o. MRN: [id number]  No chief complaint on file.   HPI  73 year old male with history of Parkinson's disease as well as focal dystonia affecting right lower extremity.  He has had primarily foot inversion as well as foot drop.  He ambulates with a walker.  He has had botulinum toxin injection approximately 6 weeks ago to the following muscle groups  Right tibialis posterior 75 units Right peroneus longus 75 units Right gluteus medius 75 units ultrasound-guided Right Piriformis 75 ultrasound-guided  Has been better with his walking since the injection once again.  This is the second injection he is had with the same dosing and treatment pattern  ROS Patient has had back pain for about a week after the procedure   Objective:     There were no vitals taken for this visit. BP Readings from Last 3 Encounters:  04/30/23 122/70  04/06/23 122/70  03/30/23 115/71      Physical Exam Patient with slow cadence, uses rollator walker forward flexed posture he does clear his toes adequately minimal external rotation at the hip.  No evidence of knee buckling His tone in his right lower extremity appears normal at this time no evidence of clonus at the ankle. Speech hypophonic, hyperkinetic dysarthria Positive bradykinesia  No results found for any visits on 05/11/23.  Last CBC Lab Results  Component Value Date   WBC 6.9 04/06/2023   HGB 14.0 04/06/2023   HCT 42.4 04/06/2023   MCV 88.6 04/06/2023   MCH 28.7 12/11/2022   RDW 14.3 04/06/2023   PLT 147.0 (L) 04/06/2023   Last metabolic panel Lab Results  Component Value Date   GLUCOSE 153 (H) 04/06/2023   NA 138 04/06/2023   K 4.1 04/06/2023   CL 99 04/06/2023   CO2 33 (H) 04/06/2023   BUN 25 (H) 04/06/2023   CREATININE 1.35 04/06/2023   GFR 52.76 (L) 04/06/2023   CALCIUM  8.8 04/06/2023   PROT 6.5 04/06/2023    ALBUMIN 4.0 04/06/2023   BILITOT 0.5 04/06/2023   ALKPHOS 69 04/06/2023   AST 6 04/06/2023   ALT 2 04/06/2023   ANIONGAP 11 12/11/2022   Last lipids Lab Results  Component Value Date   CHOL 96 04/06/2023   HDL 30.10 (L) 04/06/2023   LDLCALC 45 04/06/2023   LDLDIRECT 51.0 04/24/2019   TRIG 106.0 04/06/2023   CHOLHDL 3 04/06/2023   Last hemoglobin A1c Lab Results  Component Value Date   HGBA1C 6.8 (A) 04/30/2023   Last thyroid  functions Lab Results  Component Value Date   TSH 3.78 01/30/2023   Last vitamin D Lab Results  Component Value Date   VD25OH 27.9 06/25/2017   Last vitamin B12 and Folate Lab Results  Component Value Date   VITAMINB12 956 (H) 02/22/2017   FOLATE >24.8 08/19/2012         Assessment & Plan:  1.  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  His focal dystonia affects his foot inverters plantar flexors as well as hip external rotators.  Would keep with the same treatment protocol  See above  Return in about 6 weeks (around 06/22/2023) for Botox  300U G24.8, [id number].    Genetta Kenning, MD

## 2023-05-15 ENCOUNTER — Other Ambulatory Visit: Payer: Self-pay | Admitting: Family Medicine

## 2023-05-15 ENCOUNTER — Other Ambulatory Visit: Payer: Self-pay | Admitting: Cardiology

## 2023-05-15 DIAGNOSIS — I255 Ischemic cardiomyopathy: Secondary | ICD-10-CM

## 2023-05-15 DIAGNOSIS — I509 Heart failure, unspecified: Secondary | ICD-10-CM

## 2023-05-15 DIAGNOSIS — M109 Gout, unspecified: Secondary | ICD-10-CM

## 2023-05-15 MED ORDER — ALLOPURINOL 100 MG PO TABS: 150.0000 mg | ORAL_TABLET | Freq: Every day | ORAL | 3 refills | Status: DC

## 2023-05-15 NOTE — Telephone Encounter (Signed)
 LOV:04/06/23 AVW:[id number] SCHEDULED LAST REFILL: TRAMADOL  HCL 50 MG TABLET 03/13/23 60 tablets 1 refills

## 2023-05-23 ENCOUNTER — Other Ambulatory Visit: Payer: Self-pay | Admitting: Neurology

## 2023-05-23 DIAGNOSIS — G20A2 Parkinson's disease without dyskinesia, with fluctuations: Secondary | ICD-10-CM

## 2023-05-23 DIAGNOSIS — G20A1 Parkinson's disease without dyskinesia, without mention of fluctuations: Secondary | ICD-10-CM

## 2023-05-23 DIAGNOSIS — G253 Myoclonus: Secondary | ICD-10-CM

## 2023-05-29 ENCOUNTER — Encounter: Payer: Self-pay | Admitting: Family Medicine

## 2023-05-29 ENCOUNTER — Ambulatory Visit (INDEPENDENT_AMBULATORY_CARE_PROVIDER_SITE_OTHER): Admitting: Family Medicine

## 2023-05-29 VITALS — BP 104/60 | HR 76 | Temp 97.5°F | Ht 69.0 in | Wt 252.5 lb

## 2023-05-29 DIAGNOSIS — G20B2 Parkinson's disease with dyskinesia, with fluctuations: Secondary | ICD-10-CM | POA: Diagnosis not present

## 2023-05-29 DIAGNOSIS — R350 Frequency of micturition: Secondary | ICD-10-CM

## 2023-05-29 DIAGNOSIS — R410 Disorientation, unspecified: Secondary | ICD-10-CM | POA: Insufficient documentation

## 2023-05-29 DIAGNOSIS — R0602 Shortness of breath: Secondary | ICD-10-CM

## 2023-05-29 LAB — POC URINALSYSI DIPSTICK (AUTOMATED)
Bilirubin, UA: NEGATIVE
Blood, UA: NEGATIVE
Glucose, UA: NEGATIVE
Ketones, UA: NEGATIVE
Leukocytes, UA: NEGATIVE
Nitrite, UA: NEGATIVE
Protein, UA: NEGATIVE
Spec Grav, UA: 1.01 (ref 1.010–1.025)
Urobilinogen, UA: 0.2 U/dL
pH, UA: 6.5 (ref 5.0–8.0)

## 2023-05-29 NOTE — Progress Notes (Signed)
 Patient ID: Henry [name], male    DOB: Dec 05, 1950, 73 y.o.   MRN: [id number]  This visit was conducted in person.  BP 104/60   Pulse 76   Temp (!) 97.5 F (36.4 C) (Temporal)   Ht 5\' 9"  (1.753 m)   Wt 252 lb 8 oz (114.5 kg)   SpO2 95%   BMI 37.29 kg/m    CC:  Chief Complaint  Patient presents with   Urinary Frequency   Altered Mental Status    Subjective:   HPI: Henry [name] is a 73 y.o. male patient of Dr. Harrel Lim presenting on 05/29/2023 for Urinary Frequency and Altered Mental Status  He has a history of DM ( A1C 6.8) , HTN, CAD, BPH and parkinson's disease, HFrPF  He presents with his wife as historian. IN last 2-3 days he has been noting demons coming out of TV, altered mental status, talking out of head.  He reports bad dreams constantly.  Paranoid.Aaron Aas thinks he    Increase SOB lately... possible panic attacks.  No chest pain. No N/V/D. He is very constipated.. using senekot., miralax.. no help, used dulcolax. 8 oz  of  mag citrate  Large BM 2-3 days ago... that when he started acting funny.  Drinks 24 oz a day, sometimes more.   No cough, no congestion.  No recent falls, no HA.  No new meds except stool meds. . CBGs 158  He is on lasix  and spironolactone .  Dr. Winferd Hatter Neuro  Wt Readings from Last 3 Encounters:  05/29/23 252 lb 8 oz (114.5 kg)  04/30/23 256 lb 9.6 oz (116.4 kg)  04/06/23 256 lb (116.1 kg)        Relevant past medical, surgical, family and social history reviewed and updated as indicated. Interim medical history since our last visit reviewed. Allergies and medications reviewed and updated. Outpatient Medications Prior to Visit  Medication Sig Dispense Refill   acetaminophen  (TYLENOL ) 500 MG tablet Take 1,000 mg by mouth at bedtime as needed for mild pain or headache.      albuterol  (VENTOLIN  HFA) 108 (90 Base) MCG/ACT inhaler TAKE 2 PUFFS BY MOUTH EVERY 6 HOURS AS NEEDED FOR WHEEZE OR SHORTNESS OF BREATH 18 each 2   allopurinol   (ZYLOPRIM ) 100 MG tablet Take 1.5 tablets (150 mg total) by mouth daily. 135 tablet 3   aspirin  EC 81 MG tablet Take 1 tablet (81 mg total) by mouth daily. Swallow whole.     Carbidopa -Levodopa  ER (SINEMET  CR) 25-100 MG tablet controlled release TAKE 2 TABLETS BY MOUTH 5 TIMES PER DAY AS DIRECTED 900 tablet 0   Cholecalciferol (VITAMIN D) 50 MCG (2000 UT) tablet Take 2,000 Units by mouth daily.      colchicine  0.6 MG tablet Take 1 tablet (0.6 mg total) by mouth daily as needed (gout).     Continuous Blood Gluc Receiver (DEXCOM G6 RECEIVER) DEVI Use to check blood sugar daily 1 each 0   Continuous Glucose Sensor (DEXCOM G6 SENSOR) MISC USE TO CHECK BLOOD SUGAR DAILY 9 each 3   Continuous Glucose Transmitter (DEXCOM G6 TRANSMITTER) MISC Change every 3 months 1 each 3   dorzolamide-timolol (COSOPT) 22.3-6.8 MG/ML ophthalmic solution Place 1 drop into the right eye 2 (two) times daily.     DULoxetine  (CYMBALTA ) 30 MG capsule TAKE 1 CAPSULE BY MOUTH EVERY DAY 90 capsule 0   fluticasone -salmeterol (WIXELA INHUB) 250-50 MCG/ACT AEPB INHALE 1 PUFF INTO THE LUNGS IN THE MORNING AND AT BEDTIME**RINSE AFTER  USE** 180 each 1   FREESTYLE LITE test strip USE TO CHECK BLOOD SUGAR 4 TIMES DAILY. 150 strip 3   furosemide  (LASIX ) 20 MG tablet Take 20 mg tablet and 1/2 of the 80 mg tablet once daily to equal 60 mg daily 90 tablet 3   furosemide  (LASIX ) 80 MG tablet TAKE 1 TABLET BY MOUTH EVERY DAY (Patient taking differently: Take 40 mg by mouth daily.) 90 tablet 1   icosapent  Ethyl (VASCEPA ) 1 g capsule Take 2 capsules (2 g total) by mouth 2 (two) times daily. 360 capsule 3   Insulin  Disposable Pump (OMNIPOD 5 DEXG7G6 PODS GEN 5) MISC CHANGE POD EVERY 3 DAYS 30 each 3   Insulin  Disposable Pump (OMNIPOD 5 G6 INTRO, GEN 5,) KIT 1 kit by Does not apply route daily. 1 kit 0   insulin  lispro (HUMALOG ) 100 UNIT/ML injection USE MAXIMUM 76 UNITS PER   DAY WITH V-GO PUMP 60 mL 3   levothyroxine  (SYNTHROID ) 150 MCG tablet  Take 1 tablet (150 mcg total) by mouth daily before breakfast. 90 tablet 3   losartan  (COZAAR ) 25 MG tablet Take 1 tablet (25 mg total) by mouth daily. 90 tablet 2   metoprolol  succinate (TOPROL -XL) 50 MG 24 hr tablet TAKE 1 TABLET BY MOUTH EVERY DAY WITH OR IMMEDIATELY FOLLOWING A MEAL 90 tablet 1   NUPLAZID  34 MG CAPS TAKE 1 CAPSULE BY MOUTH 1 TIME A DAY 90 capsule 0   OVER THE COUNTER MEDICATION Apply 1 application topically daily as needed (pain). Theraworx Pain Cream     polyethylene glycol (MIRALAX / GLYCOLAX) packet Take 8.5 g by mouth daily.     potassium chloride  SA (KLOR-CON  M) 20 MEQ tablet TAKE 40 MEQ BY MOUTH IN THE MORNING AND TAKE 20 MEQ IN THE EVENING 270 tablet 1   rosuvastatin  (CRESTOR ) 20 MG tablet TAKE 1 TAB BY MOUTH EVERYDAY AT BEDTIME 90 tablet 3   sennosides-docusate sodium  (SENOKOT-S) 8.6-50 MG tablet Take 1 tablet by mouth daily as needed for constipation. Take along with miralax.     spironolactone  (ALDACTONE ) 25 MG tablet TAKE 1/2 TABLET BY MOUTH EVERY DAY 45 tablet 1   tirzepatide  (MOUNJARO ) 7.5 MG/0.5ML Pen Inject 7.5 mg into the skin once a week. 6 mL 3   traMADol  (ULTRAM ) 50 MG tablet TAKE 1 TABLET BY MOUTH EVERY 12 HOURS AS NEEDED 60 tablet 1   vitamin B-12 (CYANOCOBALAMIN ) 1000 MCG tablet Take 1,000 mcg by mouth daily.     nitroGLYCERIN  (NITROSTAT ) 0.4 MG SL tablet Place 1 tablet (0.4 mg total) under the tongue every 5 (five) minutes as needed for chest pain. 25 tablet 12   No facility-administered medications prior to visit.     Per HPI unless specifically indicated in ROS section below Review of Systems  Constitutional:  Negative for fatigue and fever.  HENT:  Negative for ear pain.   Eyes:  Negative for pain.  Respiratory:  Negative for cough and shortness of breath.   Cardiovascular:  Negative for chest pain, palpitations and leg swelling.  Gastrointestinal:  Negative for abdominal pain.  Genitourinary:  Positive for frequency. Negative for dysuria.   Musculoskeletal:  Negative for arthralgias.  Neurological:  Negative for syncope, light-headedness and headaches.  Psychiatric/Behavioral:  Positive for confusion and hallucinations. Negative for dysphoric mood. The patient is nervous/anxious.    Objective:  BP 104/60   Pulse 76   Temp (!) 97.5 F (36.4 C) (Temporal)   Ht 5\' 9"  (1.753 m)   Wt  252 lb 8 oz (114.5 kg)   SpO2 95%   BMI 37.29 kg/m   Wt Readings from Last 3 Encounters:  05/29/23 252 lb 8 oz (114.5 kg)  04/30/23 256 lb 9.6 oz (116.4 kg)  04/06/23 256 lb (116.1 kg)      Physical Exam Constitutional:      Appearance: He is well-developed.  HENT:     Head: Normocephalic.     Right Ear: Hearing normal.     Left Ear: Hearing normal.     Nose: Nose normal.  Neck:     Thyroid : No thyroid  mass or thyromegaly.     Vascular: No carotid bruit.     Trachea: Trachea normal.  Cardiovascular:     Rate and Rhythm: Normal rate and regular rhythm.     Pulses: Normal pulses.     Heart sounds: Heart sounds not distant. No murmur heard.    No friction rub. No gallop.     Comments: No peripheral edema Pulmonary:     Effort: Pulmonary effort is normal. No respiratory distress.     Breath sounds: Normal breath sounds.  Skin:    General: Skin is warm and dry.     Findings: No erythema, signs of injury or rash.  Neurological:     Mental Status: He is oriented to person, place, and time. He is confused.     Cranial Nerves: Cranial nerves 2-12 are intact.     Sensory: Sensation is intact.     Motor: Motor function is intact.     Coordination: Coordination abnormal.     Gait: Gait abnormal.  Psychiatric:        Speech: Speech normal.        Behavior: Behavior normal.        Thought Content: Thought content normal.       Results for orders placed or performed in visit on 05/29/23  POCT Urinalysis Dipstick (Automated)   Collection Time: 05/29/23  3:50 PM  Result Value Ref Range   Color, UA Yellow    Clarity, UA Clear     Glucose, UA Negative Negative   Bilirubin, UA Negative    Ketones, UA Negative    Spec Grav, UA 1.010 1.010 - 1.025   Blood, UA Negative    pH, UA 6.5 5.0 - 8.0   Protein, UA Negative Negative   Urobilinogen, UA 0.2 0.2 or 1.0 E.U./dL   Nitrite, UA Negative    Leukocytes, UA Negative Negative   *Note: Due to a large number of results and/or encounters for the requested time period, some results have not been displayed. A complete set of results can be found in Results Review.    Assessment and Plan  Urinary frequency -     POCT Urinalysis Dipstick (Automated)  Delirium Assessment & Plan: Acute, unclear cause.  Did start after constipation and multiple laxatives including large dose of mag citrate.  Now with bowel movements so constipation does not seem to be because of acute mental status changes. No sign of infection of skin, GI tract or respiratory. No clear evidence of new cardiopulmonary change but patient with history of congestive heart failure.  Will check BNP but weight is at baseline, patient euvolemic in office. Some urinary frequency but urinalysis shows well-hydrated and no clear sign of urinary infection.  He is on 2 diuretics. Will evaluate with labs for secondary cause of altered mental status. No known falls or sign of head injury/bleed.  Blood pressure well-controlled. No focal  neurochanges.  CVA unlikely. Mental status changes may be secondary to Parkinson's or Parkinson's medication.  If initial workup is unrevealing, I have suggested he contact Dr. Winferd Hatter his neurologist for further recommendations.  Return and ER precautions provided to wife.  Orders: -     Comprehensive metabolic panel with GFR -     Magnesium -     TSH  SOB (shortness of breath) Assessment & Plan: Chronic, slight recent increase per patient.  Wife feels that it could be more related to panic and hallucinations. Lungs clear to auscultation but diminished air movement throughout.  He has  history of asthma. No infectious symptoms. Will evaluate with BNP.  Orders: -     Brain natriuretic peptide -     CBC with Differential/Platelet  Parkinson's disease with dyskinesia and fluctuating manifestations (HCC) Assessment & Plan: Chronic, followed by Dr. Brena Calk neurology.     No follow-ups on file.   Herby Lolling, MD

## 2023-05-29 NOTE — Assessment & Plan Note (Signed)
 Acute, unclear cause.  Did start after constipation and multiple laxatives including large dose of mag citrate.  Now with bowel movements so constipation does not seem to be because of acute mental status changes. No sign of infection of skin, GI tract or respiratory. No clear evidence of new cardiopulmonary change but patient with history of congestive heart failure.  Will check BNP but weight is at baseline, patient euvolemic in office. Some urinary frequency but urinalysis shows well-hydrated and no clear sign of urinary infection.  He is on 2 diuretics. Will evaluate with labs for secondary cause of altered mental status. No known falls or sign of head injury/bleed.  Blood pressure well-controlled. No focal neurochanges.  CVA unlikely. Mental status changes may be secondary to Parkinson's or Parkinson's medication.  If initial workup is unrevealing, I have suggested he contact Dr. Winferd Hatter his neurologist for further recommendations.  Return and ER precautions provided to wife.

## 2023-05-29 NOTE — Assessment & Plan Note (Signed)
 Chronic, followed by Dr. Brena Calk neurology.

## 2023-05-29 NOTE — Assessment & Plan Note (Signed)
 Chronic, slight recent increase per patient.  Wife feels that it could be more related to panic and hallucinations. Lungs clear to auscultation but diminished air movement throughout.  He has history of asthma. No infectious symptoms. Will evaluate with BNP.

## 2023-05-30 LAB — COMPREHENSIVE METABOLIC PANEL WITH GFR
ALT: 2 U/L (ref 0–53)
AST: 5 U/L (ref 0–37)
Albumin: 4.4 g/dL (ref 3.5–5.2)
Alkaline Phosphatase: 78 U/L (ref 39–117)
BUN: 23 mg/dL (ref 6–23)
CO2: 33 meq/L — ABNORMAL HIGH (ref 19–32)
Calcium: 9.1 mg/dL (ref 8.4–10.5)
Chloride: 97 meq/L (ref 96–112)
Creatinine, Ser: 1.32 mg/dL (ref 0.40–1.50)
GFR: 54.14 mL/min — ABNORMAL LOW (ref >60.00)
Glucose, Bld: 141 mg/dL — ABNORMAL HIGH (ref 70–99)
Potassium: 4.6 meq/L (ref 3.5–5.1)
Sodium: 139 meq/L (ref 135–145)
Total Bilirubin: 0.6 mg/dL (ref 0.2–1.2)
Total Protein: 6.5 g/dL (ref 6.0–8.3)

## 2023-05-30 LAB — TSH: TSH: 5.14 u[IU]/mL (ref 0.35–5.50)

## 2023-05-30 LAB — CBC WITH DIFFERENTIAL/PLATELET
Basophils Absolute: 0 10*3/uL (ref 0.0–0.1)
Basophils Relative: 0.6 % (ref 0.0–3.0)
Eosinophils Absolute: 0.1 10*3/uL (ref 0.0–0.7)
Eosinophils Relative: 1.8 % (ref 0.0–5.0)
HCT: 43.5 % (ref 39.0–52.0)
Hemoglobin: 14.5 g/dL (ref 13.0–17.0)
Lymphocytes Relative: 25.3 % (ref 12.0–46.0)
Lymphs Abs: 1.8 10*3/uL (ref 0.7–4.0)
MCHC: 33.3 g/dL (ref 30.0–36.0)
MCV: 86.8 fl (ref 78.0–100.0)
Monocytes Absolute: 0.5 10*3/uL (ref 0.1–1.0)
Monocytes Relative: 6.9 % (ref 3.0–12.0)
Neutro Abs: 4.8 10*3/uL (ref 1.4–7.7)
Neutrophils Relative %: 65.4 % (ref 43.0–77.0)
Platelets: 159 10*3/uL (ref 150.0–400.0)
RBC: 5.01 Mil/uL (ref 4.22–5.81)
RDW: 14.4 % (ref 11.5–15.5)
WBC: 7.3 10*3/uL (ref 4.0–10.5)

## 2023-05-30 LAB — BRAIN NATRIURETIC PEPTIDE: Pro B Natriuretic peptide (BNP): 35 pg/mL (ref 0.0–100.0)

## 2023-05-30 LAB — MAGNESIUM: Magnesium: 2.2 mg/dL (ref 1.5–2.5)

## 2023-05-31 ENCOUNTER — Ambulatory Visit: Payer: Self-pay | Admitting: Family Medicine

## 2023-06-01 NOTE — Progress Notes (Signed)
 Remote ICD transmission.

## 2023-06-01 NOTE — Addendum Note (Signed)
 Addended by: Lott Rouleau A on: 06/01/2023 10:56 AM   Modules accepted: Orders

## 2023-06-05 ENCOUNTER — Telehealth: Payer: Self-pay | Admitting: Neurology

## 2023-06-05 NOTE — Telephone Encounter (Signed)
 Pts wife called to see if Dr.Tat would see Henry [name] before his appt he has scheduled. He is hallucinating bad and would like to talk with tat

## 2023-06-08 ENCOUNTER — Other Ambulatory Visit: Payer: Self-pay | Admitting: Cardiology

## 2023-06-27 ENCOUNTER — Other Ambulatory Visit: Payer: Self-pay | Admitting: Neurology

## 2023-06-27 DIAGNOSIS — G20A1 Parkinson's disease without dyskinesia, without mention of fluctuations: Secondary | ICD-10-CM

## 2023-06-27 DIAGNOSIS — R443 Hallucinations, unspecified: Secondary | ICD-10-CM

## 2023-06-28 ENCOUNTER — Encounter: Attending: Physical Medicine & Rehabilitation | Admitting: Physical Medicine & Rehabilitation

## 2023-06-28 ENCOUNTER — Encounter: Payer: Self-pay | Admitting: Physical Medicine & Rehabilitation

## 2023-06-28 VITALS — BP 120/78 | HR 70 | Ht 69.0 in | Wt 252.6 lb

## 2023-06-28 DIAGNOSIS — G248 Other dystonia: Secondary | ICD-10-CM | POA: Diagnosis not present

## 2023-06-28 MED ORDER — ONABOTULINUMTOXINA 100 UNITS IJ SOLR
300.0000 [IU] | Freq: Once | INTRAMUSCULAR | Status: AC
  Administered 2023-06-28: 300 [IU] via INTRAMUSCULAR

## 2023-06-28 MED ORDER — SODIUM CHLORIDE (PF) 0.9 % IJ SOLN
6.0000 mL | Freq: Once | INTRAMUSCULAR | Status: AC
  Administered 2023-06-28: 6 mL

## 2023-06-28 NOTE — Progress Notes (Signed)
 Botox  Injection for spasticity using ultrasound guidance  Dilution: 50 units/ml Indication: Severe spasticity which interferes with ADL,mobility and/or  hygiene and is unresponsive to medication management and other conservative care Informed consent was obtained after describing risks and benefits of the procedure with the patient. This includes bleeding, bruising, infection, excessive weakness, or medication side effects. A REMS form is on file and signed. Needle: 22 g 80mm ECHO bloc needle used for piriformis  50mm 25g used with needle EMG for Tib post and peroneus longus and Glut medius Number of units per muscle Right tibialis posterior 75 units Right peroneus longus 75 units Right gluteus medius 75 units Right Piriformis 75 Buttocks injections were done under direct ultrasound guidance, 4 Hz curvilinear transducer using ECHO block needle long axis view, the patient tolerated the procedure well. Post procedure instructions were given. A followup appointment was made.

## 2023-06-28 NOTE — Patient Instructions (Signed)

## 2023-07-10 ENCOUNTER — Other Ambulatory Visit: Payer: Self-pay | Admitting: Family Medicine

## 2023-07-11 NOTE — Telephone Encounter (Signed)
 LOV:04/06/23 WNC:WNUYPWH SCHEDULED  LAST REFILL:  traMADol  HCl 50 MG Oral Tablet 05/16/23 60 tablets 1 refill

## 2023-07-16 ENCOUNTER — Encounter: Payer: Medicare Other | Admitting: Neurology

## 2023-07-17 ENCOUNTER — Ambulatory Visit (INDEPENDENT_AMBULATORY_CARE_PROVIDER_SITE_OTHER): Admitting: Neurology

## 2023-07-17 VITALS — BP 142/88 | HR 76 | Ht 69.0 in | Wt 247.0 lb

## 2023-07-17 DIAGNOSIS — R441 Visual hallucinations: Secondary | ICD-10-CM | POA: Diagnosis not present

## 2023-07-17 DIAGNOSIS — Z9689 Presence of other specified functional implants: Secondary | ICD-10-CM

## 2023-07-17 DIAGNOSIS — G20A2 Parkinson's disease without dyskinesia, with fluctuations: Secondary | ICD-10-CM | POA: Diagnosis not present

## 2023-07-17 DIAGNOSIS — G249 Dystonia, unspecified: Secondary | ICD-10-CM | POA: Diagnosis not present

## 2023-07-17 MED ORDER — DULOXETINE HCL 20 MG PO CPEP: 20.0000 mg | ORAL_CAPSULE | Freq: Every day | ORAL | 0 refills | Status: DC

## 2023-07-17 NOTE — Patient Instructions (Addendum)
 Decrease cymbalta  to 20 mg daily x 4 weeks and then STOP cymbalta  (duloxetine ) STOP the tramadol  Let me know if its not better and we will get some brain imaging.    SAVE THE DATE!  We are planning a Parkinsons Disease educational symposium at [facility], [address] [place], [place], [place] [address] on September 19.  We will have a movement disorder physician expert from [employer] coming to speak and a caregiver speaker.  We will have a panel of experts that will show you who you may need on your team of people on your journey with Parkinsons.  I hope to see you there!  Use this QR code to register by scanning it with the camera app on your phone:      Need more help with registration?  Contact [email or web address]

## 2023-07-17 NOTE — Procedures (Signed)
 DBS Programming was performed.    Manufacturer of DBS device: Medtronic  Total time spent programming was 5 minutes.  Device was confirmed to be on.  Soft start was confirmed to be on.  Impedences were checked and were within normal limits.  Battery was checked and was not near end of life    Final settings were as follows with Group B active:   Active Contact Amplitude  PW (ms) Frequency (hz) Side Effects Battery  Left Brain        09/08/19 1-2+ 4.4V 90 170  2.59  10/08/19             Group A 1-2+ 4.9mA 90 170         Group B 0-1-2-C+ 1.1 80 125     0- (1.1)1-(1.0)2-(0.6)       01/19/20             Group B 0-1-2-C+ 1.1 80 125  4 yrs 9 months  05/27/20 Group B 0-1-2-C+ 1.3 80 125  3 years, 1 month  11/30/20 0-1-2-C+ 1.3 80 125  3 years, 5 months  05/31/21 0-1-2-C+ 1.3 80 125 Higher amp with face pull 2 yr, 10 months   12/05/21 0-1-2-C+ 1.4 80 125  2 yr, 3 months  06/06/22 0-1-2-C+ 1.4 80 125    11/13/22 0-1-2-C+ 1.4 80 125  3 months  01/15/23 0-1-2-C+ 1.4 80 125  60yrs, 3 months - 97%  07/17/23 0-1-2-C+ 1.4 80 125  3 yrs, 9 months - 83%          Right Brain        09/08/19 4-7+ 4.5V 90 170    10/08/19             Group A 4-7+ 2.29mA 90 170         Group B 4-7-C+ 2.5 60 125     4-(2.5)7-(0.6)C+       01/19/20             Group B 4-7-C+ 2.8 60 165    05/27/20 group B 4-7-C+ 2.8 60 165    11/30/20 4-7-C+ 2.8 60 165    05/31/21 4-7-C+ 2.9 60 165    12/05/21 4-7-C+ 2.9 60 165    06/06/22 4-7-C+ 2.9 60 165    11/13/22 4-7-C+ 2.9 60 165    01/15/23 4-7-C+ 2.9 60 165    07/17/23 4-7-C+ 2.9 60 165

## 2023-07-17 NOTE — Progress Notes (Signed)
 Assessment/Plan:   1.  Parkinsons Disease             -Status post bilateral DBS STN surgery             -Battery changes occurring September 05, 2012; June 10, 2015; November 13, 2017; 09/25/19; 12/15/22.               -Take carbidopa /levodopa  25/100 CR, currently taking 2 tablets up to 5 times per day.    2.  RBD             -He has tried clonazepam  several times and ultimately ends up stopping it because of side effect.  Could try Rozerem in the future if insurance would pay.  Decided to hold off for now as the patient states that while dreaming is vivid, it is not particularly bothersome and he is not falling out of the bed.   3.  OSAS, noncompliant with CPAP.             -Patient aware of risks/morbidity and mortality associated with untreated sleep apnea 4.  CHF             -ICD placed on 11/15/2013  -DBS will need placed into special mode if pt ever requires cardioversion 5.  Dysphagia  -Patient last modified barium swallow was March 30, 2021.  Patient with mild oral phase dysphagia and mild to moderate pharyngeal phase dysphagia and also the appearance of esophageal phase dysphagia.  The speech therapist recommended for his regular diet, but if he has more coughing or other dysphagia symptoms, to consider nectar thick liquids.  -It should be noted that Parkinson's disease does not cause esophageal phase dysphagia and this may need to be evaluated by gastroenterology.  It could certainly be responsible for the other phases of dysphagia.  6.  Myoclonus  - Resolved when gabapentin  discontinued 7.  Hallucinations  -were Markedly improved on Nuplazid , 34 mg daily.  He was to wake up and have trouble differentiating dreams from reality, but otherwise seems to do fairly well.  -d/c tramadol   - Going to wean him off of duloxetine  over the next month, although suspect that tramadol  is a bigger source for hallucinations   - If above does not help, they will let me know and we will consider  neuroimaging.  Would like to do mri but ICD likely not compatible.  I will check on that.   8.  Paroxysmal A-fib  -Not on anticoagulation because of his history of falls. 9.  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 Carilyn and doing Botox .  This has been significantly helpful.  Subjective:   Henry [name] was seen today in follow up for Parkinsons disease.  My previous records were reviewed prior to todays visit as well as outside records available to me.  Patient with wife who supplements history.  Has been having more hallucinations.  He saw Dr. Avelina May 20 with hallucinations of demons coming out of the TV.  Labs were done and were unremarkable.  He reports today hallucinations of people arise out of sleep and they don't talk to me but I talk to them.  He talks to his wife about demons and ghosts and the people on the TV conspiring against him.  Wife states that he had death on his mind but now its demons.  He's been taking tramadol  and last took it a day ago.  Had one fall in the middle of the  night - backed up and missed the toilet.  He has a bedside urinal but didn't use it.    Current prescribed movement disorder medications: Carbidopa /levodopa  25/100 CR, 2 tablets at 8 AM/noon/4pm/8pm/midnight (this is how he is dosing this now) Carbidopa /levodopa  50/200 CR at bedtime (he's not taking that) Cymbalta , 30 mg daily Nuplazid , 34 mg daily   Prior medications: Gabapentin  (myoclonus);    ALLERGIES:   Allergies  Allergen Reactions   Penicillins Anaphylaxis and Rash    Because of a history of documented adverse serious drug reaction;Medi Alert bracelet  is recommended PATIENT HAS HAD A PCN REACTION WITH IMMEDIATE RASH, FACIAL/TONGUE/THROAT SWELLING, SOB, OR LIGHTHEADEDNESS WITH HYPOTENSION:  #  #  YES  #  #  Has patient had a PCN reaction causing severe rash involving mucus membranes or skin necrosis: unknown Has patient had a PCN reaction that required  hospitalization NO Has patient had a PCN reaction occurring within the last 10 years: NO   Klonopin  [Clonazepam ] Other (See Comments)    agitation   Peanut-Containing Drug Products Cough   Watermelon [Citrullus Vulgaris] Other (See Comments) and Cough    Tickle in throat    CURRENT MEDICATIONS:  Outpatient Encounter Medications as of 07/17/2023  Medication Sig   acetaminophen  (TYLENOL ) 500 MG tablet Take 1,000 mg by mouth at bedtime as needed for mild pain or headache.    albuterol  (VENTOLIN  HFA) 108 (90 Base) MCG/ACT inhaler TAKE 2 PUFFS BY MOUTH EVERY 6 HOURS AS NEEDED FOR WHEEZE OR SHORTNESS OF BREATH   allopurinol  (ZYLOPRIM ) 100 MG tablet Take 1.5 tablets (150 mg total) by mouth daily.   aspirin  EC 81 MG tablet Take 1 tablet (81 mg total) by mouth daily. Swallow whole.   Carbidopa -Levodopa  ER (SINEMET  CR) 25-100 MG tablet controlled release TAKE 2 TABLETS BY MOUTH 5 TIMES PER DAY AS DIRECTED   Cholecalciferol (VITAMIN D) 50 MCG (2000 UT) tablet Take 2,000 Units by mouth daily.    colchicine  0.6 MG tablet Take 1 tablet (0.6 mg total) by mouth daily as needed (gout).   Continuous Blood Gluc Receiver (DEXCOM G6 RECEIVER) DEVI Use to check blood sugar daily   Continuous Glucose Sensor (DEXCOM G6 SENSOR) MISC USE TO CHECK BLOOD SUGAR DAILY   Continuous Glucose Transmitter (DEXCOM G6 TRANSMITTER) MISC Change every 3 months   dorzolamide-timolol (COSOPT) 22.3-6.8 MG/ML ophthalmic solution Place 1 drop into the right eye 2 (two) times daily.   DULoxetine  (CYMBALTA ) 20 MG capsule Take 1 capsule (20 mg total) by mouth daily.   fluticasone -salmeterol (WIXELA INHUB) 250-50 MCG/ACT AEPB INHALE 1 PUFF INTO THE LUNGS IN THE MORNING AND AT BEDTIME**RINSE AFTER USE**   FREESTYLE LITE test strip USE TO CHECK BLOOD SUGAR 4 TIMES DAILY.   furosemide  (LASIX ) 20 MG tablet Take 20 mg tablet and 1/2 of the 80 mg tablet once daily to equal 60 mg daily   furosemide  (LASIX ) 80 MG tablet TAKE 1 TABLET BY MOUTH  EVERY DAY   icosapent  Ethyl (VASCEPA ) 1 g capsule Take 2 capsules (2 g total) by mouth 2 (two) times daily.   Insulin  Disposable Pump (OMNIPOD 5 DEXG7G6 PODS GEN 5) MISC CHANGE POD EVERY 3 DAYS   Insulin  Disposable Pump (OMNIPOD 5 G6 INTRO, GEN 5,) KIT 1 kit by Does not apply route daily.   insulin  lispro (HUMALOG ) 100 UNIT/ML injection USE MAXIMUM 76 UNITS PER   DAY WITH V-GO PUMP   levothyroxine  (SYNTHROID ) 150 MCG tablet Take 1 tablet (150 mcg total) by mouth daily  before breakfast.   losartan  (COZAAR ) 25 MG tablet Take 1 tablet (25 mg total) by mouth daily.   metoprolol  succinate (TOPROL -XL) 50 MG 24 hr tablet TAKE 1 TABLET BY MOUTH EVERY DAY WITH OR IMMEDIATELY FOLLOWING A MEAL   nitroGLYCERIN  (NITROSTAT ) 0.4 MG SL tablet Place 1 tablet (0.4 mg total) under the tongue every 5 (five) minutes as needed for chest pain.   NUPLAZID  34 MG CAPS TAKE 1 CAPSULE BY MOUTH 1 TIME A DAY   OVER THE COUNTER MEDICATION Apply 1 application topically daily as needed (pain). Theraworx Pain Cream   polyethylene glycol (MIRALAX / GLYCOLAX) packet Take 8.5 g by mouth daily.   potassium chloride  SA (KLOR-CON  M) 20 MEQ tablet TAKE 40 MEQ BY MOUTH IN THE MORNING AND TAKE 20 MEQ IN THE EVENING   rosuvastatin  (CRESTOR ) 20 MG tablet TAKE 1 TAB BY MOUTH EVERYDAY AT BEDTIME   sennosides-docusate sodium  (SENOKOT-S) 8.6-50 MG tablet Take 1 tablet by mouth daily as needed for constipation. Take along with miralax.   spironolactone  (ALDACTONE ) 25 MG tablet TAKE 1/2 TABLET BY MOUTH EVERY DAY   tirzepatide  (MOUNJARO ) 7.5 MG/0.5ML Pen Inject 7.5 mg into the skin once a week.   vitamin B-12 (CYANOCOBALAMIN ) 1000 MCG tablet Take 1,000 mcg by mouth daily.   [DISCONTINUED] DULoxetine  (CYMBALTA ) 30 MG capsule TAKE 1 CAPSULE BY MOUTH EVERY DAY   [DISCONTINUED] traMADol  (ULTRAM ) 50 MG tablet TAKE 1 TABLET BY MOUTH EVERY 12 HOURS AS NEEDED   No facility-administered encounter medications on file as of 07/17/2023.    Objective:    PHYSICAL EXAMINATION:    VITALS:   Vitals:   07/17/23 1318  BP: (!) 142/88  Pulse: 76  SpO2: 96%  Weight: 247 lb (112 kg)  Height: 5' 9 (1.753 m)    GEN:  The patient appears stated age and is in NAD. HEENT:  Normocephalic, atraumatic.  The mucous membranes are moist. The superficial temporal arteries are without ropiness or tenderness. Cardiovascular: Regular rate rhythm Lungs: Clear to auscultation bilaterally  Neurological examination:  Orientation: The patient is alert and oriented to person, place, and time.  He asks appropriate questions and is interactive.  He does tell me he feels much more clear today and yesterday that he had previously (he also has not taken tramadol  because he ran out of it) Cranial nerves: There is good facial symmetry with facial hypomimia. The speech is fluent and just mildly dysarthric today (baseline).  He is mildly hypophonic. Soft palate rises symmetrically and there is no tongue deviation. Hearing is intact to conversational tone. Sensation: Sensation is intact to light touch throughout Motor: Strength is at least antigravity x4.  Movement examination: Tone: There is normal tone in the upper and lower extremities Abnormal movements: No significant tremor today Coordination:  There is slowness with foot taps bilaterally, L>R Gait and Station: Patient had his U step in the office today.  He was flexed at the waist, but actually ambulated quite well today and did fairly well in the turns.  The right foot and ankle does evert, but actually minimally so, and was much better than prior to Botox   I have reviewed and interpreted the following labs independently    Chemistry      Component Value Date/Time   NA 139 05/29/2023 1619   NA 141 08/16/2022 1115   K 4.6 05/29/2023 1619   CL 97 05/29/2023 1619   CO2 33 (H) 05/29/2023 1619   BUN 23 05/29/2023 1619   BUN  20 08/16/2022 1115   CREATININE 1.32 05/29/2023 1619   CREATININE 1.37 (H)  07/28/2022 0826      Component Value Date/Time   CALCIUM  9.1 05/29/2023 1619   ALKPHOS 78 05/29/2023 1619   AST 5 05/29/2023 1619   ALT 2 05/29/2023 1619   BILITOT 0.6 05/29/2023 1619       Lab Results  Component Value Date   WBC 7.3 05/29/2023   HGB 14.5 05/29/2023   HCT 43.5 05/29/2023   MCV 86.8 05/29/2023   PLT 159.0 05/29/2023    Lab Results  Component Value Date   TSH 5.14 05/29/2023     Total time spent on today's visit was 50  minutes, including both face-to-face time and nonface-to-face time.  Time included that spent on review of records (prior notes available to me/labs/imaging if pertinent), discussing treatment and goals, answering patient's questions and coordinating care.  This was independent of his DBS time, on a separate note  Cc:  Henry Arlyss RAMAN, MD

## 2023-07-18 ENCOUNTER — Ambulatory Visit: Payer: Medicare Other

## 2023-07-18 DIAGNOSIS — I255 Ischemic cardiomyopathy: Secondary | ICD-10-CM

## 2023-07-19 ENCOUNTER — Other Ambulatory Visit: Payer: Self-pay | Admitting: Cardiology

## 2023-07-20 ENCOUNTER — Encounter: Admitting: Neurology

## 2023-07-24 LAB — CUP PACEART REMOTE DEVICE CHECK
Battery Remaining Longevity: 12 mo
Battery Voltage: 2.88 V
Brady Statistic AP VP Percent: 0 %
Brady Statistic AP VS Percent: 2.73 %
Brady Statistic AS VP Percent: 0.06 %
Brady Statistic AS VS Percent: 97.2 %
Brady Statistic RA Percent Paced: 2.73 %
Brady Statistic RV Percent Paced: 0.06 %
Date Time Interrogation Session: 20250714104510
HighPow Impedance: 74 Ohm
Implantable Lead Connection Status: 753985
Implantable Lead Connection Status: 753985
Implantable Lead Implant Date: 20151105
Implantable Lead Implant Date: 20151105
Implantable Lead Location: 753859
Implantable Lead Location: 753860
Implantable Lead Model: 5076
Implantable Pulse Generator Implant Date: 20151105
Lead Channel Impedance Value: 437 Ohm
Lead Channel Impedance Value: 456 Ohm
Lead Channel Impedance Value: 494 Ohm
Lead Channel Pacing Threshold Amplitude: 0.375 V
Lead Channel Pacing Threshold Amplitude: 1 V
Lead Channel Pacing Threshold Pulse Width: 0.4 ms
Lead Channel Pacing Threshold Pulse Width: 0.4 ms
Lead Channel Sensing Intrinsic Amplitude: 1.75 mV
Lead Channel Sensing Intrinsic Amplitude: 1.75 mV
Lead Channel Sensing Intrinsic Amplitude: 16.75 mV
Lead Channel Sensing Intrinsic Amplitude: 16.75 mV
Lead Channel Setting Pacing Amplitude: 2.25 V
Lead Channel Setting Pacing Amplitude: 2.5 V
Lead Channel Setting Pacing Pulse Width: 0.4 ms
Lead Channel Setting Sensing Sensitivity: 0.3 mV
Zone Setting Status: 755011

## 2023-07-25 ENCOUNTER — Ambulatory Visit: Payer: Self-pay | Admitting: Internal Medicine

## 2023-08-03 ENCOUNTER — Encounter: Payer: Self-pay | Admitting: Family Medicine

## 2023-08-03 ENCOUNTER — Ambulatory Visit (INDEPENDENT_AMBULATORY_CARE_PROVIDER_SITE_OTHER): Admitting: Family Medicine

## 2023-08-03 VITALS — BP 124/72 | HR 72 | Temp 98.0°F | Ht 69.0 in | Wt 251.3 lb

## 2023-08-03 DIAGNOSIS — R350 Frequency of micturition: Secondary | ICD-10-CM

## 2023-08-03 DIAGNOSIS — S61419A Laceration without foreign body of unspecified hand, initial encounter: Secondary | ICD-10-CM | POA: Insufficient documentation

## 2023-08-03 DIAGNOSIS — S61411A Laceration without foreign body of right hand, initial encounter: Secondary | ICD-10-CM | POA: Diagnosis not present

## 2023-08-03 LAB — POC URINALSYSI DIPSTICK (AUTOMATED)
Bilirubin, UA: NEGATIVE
Blood, UA: NEGATIVE
Glucose, UA: NEGATIVE
Ketones, UA: NEGATIVE
Leukocytes, UA: NEGATIVE
Nitrite, UA: NEGATIVE
Protein, UA: NEGATIVE
Spec Grav, UA: 1.01 (ref 1.010–1.025)
Urobilinogen, UA: NEGATIVE U/dL — AB
pH, UA: 6 (ref 5.0–8.0)

## 2023-08-03 MED ORDER — MIRABEGRON ER 25 MG PO TB24: 25.0000 mg | ORAL_TABLET | Freq: Every day | ORAL | 0 refills | Status: DC

## 2023-08-03 NOTE — Progress Notes (Signed)
 Subjective:    Patient ID: Henry [name], male    DOB: 1950-12-12, 73 y.o.   MRN: [id number]  HPI  Wt Readings from Last 3 Encounters:  08/03/23 251 lb 4.8 oz (114 kg)  07/17/23 247 lb (112 kg)  06/28/23 252 lb 9.6 oz (114.6 kg)   37.11 kg/m  Vitals:   08/03/23 1505  BP: 124/72  Pulse: 72  Temp: 98 F (36.7 C)  SpO2: 98%    73 yo pt of Dr Cleatus presents with c/o  Frequent urination  Skin tear right hand   Pmhx notable for CHF, CKD, parkinsons, microscopic hematuria , elevated psa , diabetes   Mild BPH noted at urology eval 03/2022  History of renal stones   A little burning sensation when he starts urination  Otherwise not   16 oz water three times daily    1-2 weeks No control over urination   Was taking mybetriq in past and it helped    Blood sugar is higher than usual today  Lab Results  Component Value Date   HGBA1C 6.8 (A) 04/30/2023   HGBA1C 6.9 (A) 01/30/2023   HGBA1C 6.5 (A) 10/30/2022      Takes furosemide  and spironolactone    Urinalysis today Results for orders placed or performed in visit on 08/03/23  POCT Urinalysis Dipstick (Automated)   Collection Time: 08/03/23  3:22 PM  Result Value Ref Range   Color, UA Light Yellow    Clarity, UA Clear    Glucose, UA Negative Negative   Bilirubin, UA Negative    Ketones, UA Negative    Spec Grav, UA 1.010 1.010 - 1.025   Blood, UA Negative    pH, UA 6.0 5.0 - 8.0   Protein, UA Negative Negative   Urobilinogen, UA negative (A) 0.2 or 1.0 E.U./dL   Nitrite, UA Negative    Leukocytes, UA Negative Negative   *Note: Due to a large number of results and/or encounters for the requested time period, some results have not been displayed. A complete set of results can be found in Results Review.      Lab Results  Component Value Date   NA 139 05/29/2023   K 4.6 05/29/2023   CO2 33 (H) 05/29/2023   GLUCOSE 141 (H) 05/29/2023   BUN 23 05/29/2023   CREATININE 1.32 05/29/2023   CALCIUM  9.1  05/29/2023   GFR 54.14 (L) 05/29/2023   EGFR 67.0 10/24/2022   GFRNONAA >60 12/11/2022   Lab Results  Component Value Date   WBC 7.3 05/29/2023   HGB 14.5 05/29/2023   HCT 43.5 05/29/2023   MCV 86.8 05/29/2023   PLT 159.0 05/29/2023   Lab Results  Component Value Date   ALT 2 05/29/2023   AST 5 05/29/2023   ALKPHOS 78 05/29/2023   BILITOT 0.6 05/29/2023    Skin tear this am - on right hand in bathroom   Patient Active Problem List   Diagnosis Date Noted   Skin tear of hand without complication 08/03/2023   Delirium 05/29/2023   Healthcare maintenance 04/08/2023   Cerumen impaction 04/08/2023   Hordeolum 03/21/2023   Pancreatic cyst 03/05/2022   Lower respiratory infection 02/20/2022   Lobar pneumonia (HCC) 02/20/2022   Sore throat 01/12/2022   Cyst of perianal area 01/12/2022   Strep pharyngitis 12/16/2021   Wheezing 12/16/2021   DOE (dyspnea on exertion) 12/16/2021   Parkinson's disease with EBS (electrical brain stimulation) [G20.A1] 12/05/2021   Fall at home 06/15/2021  Asthma    Skin lesion 05/30/2019   CAD (coronary artery disease) 08/25/2018   Diabetes mellitus without complication (HCC) 08/15/2018   Thrombocytopenia (HCC) 07/08/2017   Gout, unspecified 06/06/2017   Dysuria 05/25/2017   Microscopic hematuria 05/25/2017   CKD (chronic kidney disease) stage 3, GFR 30-59 ml/min (HCC) 04/12/2017   Neuropathy 02/25/2017   Urinary frequency 09/19/2016   Constipation 11/27/2015   Medicare annual wellness visit, initial 09/23/2015   Advance care planning 09/23/2015   Spinal stenosis of lumbar region 01/26/2015   History of colonic polyps    Restless leg syndrome 08/11/2014   ICD- MDT, implanted 11/13/13 11/14/2013   Chronic systolic CHF (congestive heart failure) (HCC) 11/13/2013   SOB (shortness of breath), mostly on exertion 06/05/2013   Acute on chronic combined systolic and diastolic congestive heart failure, NYHA class 4 (HCC) 05/12/2013   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/2013   Umbilical hernia 01/27/2013   Elevated PSA 12/04/2012   Other malaise and fatigue 12/04/2012   PD (Parkinson's disease) (HCC) 08/19/2012   OSA on CPAP 04/17/2012   Dysautonomia (HCC) 04/17/2012   Obesity, morbid (HCC) 04/17/2012   Hypothyroidism following radioiodine therapy 10/16/2011   GERD 05/11/2009   Type 2 diabetes mellitus with chronic kidney disease, with long-term current use of insulin  (HCC) 04/26/2009   Back pain 08/18/2008   OTHER SPEC FORMS CHRONIC ISCHEMIC HEART DISEASE 04/24/2008   HYPERPLASIA PROSTATE UNS W/O UR OBST & OTH LUTS 09/02/2007   NEPHROLITHIASIS, HX OF 09/02/2007   Ventral hernia 12/28/2006   Hyperlipidemia 08/31/2006   HTN (hypertension) 08/31/2006   Hx of CABG x 3 in 2000 08/31/2006   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)   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   Dysrhythmia    a-fib   GERD (gastroesophageal reflux disease)    Glaucoma    right eye   History of kidney stones    multiple   HLD (hyperlipidemia)    HTN (hypertension)    pt denies 08/19/12   Hyperplasia, prostate    Hypothyroidism    MI (myocardial infarction) (HCC)    Dr Pietro 2000, x3vessels bypass   Neuromuscular disorder (HCC)    Parkinson's Disease   OSA (obstructive sleep apnea)    AHI-28,on CPAP, noncompliant with CPAP   Parkinson disease (HCC)    1999   PONV (postoperative nausea and vomiting)    Restless legs    Shortness of breath    Hx: of at all times   UTI (lower urinary tract infection) 09/15/2012   Klebsiella   Ventral hernia    Walker as ambulation aid     also uses wheelchair at home/when going out   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   BIOPSY  10/14/2018   Procedure: BIOPSY;  Surgeon: Albertus Gordy HERO, MD;  Location: [facility];  Service: Gastroenterology;;   CATARACT EXTRACTION W/ INTRAOCULAR LENS IMPLANT     Hx:  of right eye   CATARACT EXTRACTION W/ INTRAOCULAR LENS IMPLANT Left 2018   COLONOSCOPY N/A 10/13/2014   Procedure: COLONOSCOPY;  Surgeon: Gordy CHRISTELLA Starch, MD;  Location: [facility];  Service: Gastroenterology;  Laterality: N/A;   COLONOSCOPY W/ BIOPSIES AND POLYPECTOMY     Hx: of   COLONOSCOPY WITH PROPOFOL  N/A 10/14/2018   Procedure: COLONOSCOPY WITH PROPOFOL ;  Surgeon: Starch Gordy CHRISTELLA, MD;  Location: [facility];  Service: Gastroenterology;  Laterality: N/A;   CORONARY ARTERY BYPASS GRAFT  2000   Sudie Laine, MD   CORONARY STENT PLACEMENT  1998   DEEP BRAIN STIMULATOR PLACEMENT  2004   Right and left VIN stimulator placement (parkinsons)   EYE SURGERY     Cataract removed in 1980s related to wood in eye when 73 years old   FINGER AMPUTATION     left pointer   IMPLANTABLE CARDIOVERTER DEFIBRILLATOR IMPLANT N/A 11/13/2013   Procedure: IMPLANTABLE CARDIOVERTER DEFIBRILLATOR IMPLANT;  Surgeon: Danelle LELON Birmingham, MD;  Location: [facility] CATH LAB;  Service: Cardiovascular;  Laterality: N/A;   INSERT / REPLACE / REMOVE PACEMAKER     medtronic   LEFT AND RIGHT HEART CATHETERIZATION WITH CORONARY ANGIOGRAM N/A 09/24/2013   Procedure: LEFT AND RIGHT HEART CATHETERIZATION WITH CORONARY ANGIOGRAM;  Surgeon: Lonni JONETTA Cash, MD;  Location[facility] CATH LAB;  Service: Cardiovascular;  Laterality: N/A;   LITHOTRIPSY     3 different times   POLYPECTOMY  10/14/2018   Procedure: POLYPECTOMY;  Surgeon: Starch Gordy CHRISTELLA, MD;  Location: [facility];  Service: Gastroenterology;;   PULSE GENERATOR IMPLANT Right 11/13/2017   Procedure: Right chest implantable pulse generator change;   Surgeon: Unice Pac, MD;  Location: [facility] OR;  Service: Neurosurgery;  Laterality: Right;  Right chest implantable pulse generator change   SUBTHALAMIC STIMULATOR BATTERY REPLACEMENT N/A 09/05/2012   Procedure: Deep brain stimulator battery change;  Surgeon: Pac Unice, MD;  Location: [facility];  Service: Neurosurgery;  Laterality: N/A;  Deep brain stimulator battery change   SUBTHALAMIC STIMULATOR BATTERY REPLACEMENT N/A 06/10/2015   Procedure: Deep Brain stimulator battery change;  Surgeon: Pac Unice, MD;  Location: [facility];  Service: Neurosurgery;  Laterality: N/A;   SUBTHALAMIC STIMULATOR BATTERY REPLACEMENT Right 09/25/2019   Procedure: Deep brain stimulator battery change;  Surgeon: Unice Pac, MD;  Location: [facility] OR;  Service: Neurosurgery;  Laterality: Right;   SUBTHALAMIC STIMULATOR BATTERY REPLACEMENT Right 12/15/2022   Procedure: IMPLANTABLE PULSE GENERATOR  BATTERY REPLACEMENT;  Surgeon: Carollee Lani BROCKS, DO;  Location: [facility] OR;  Service: Neurosurgery;  Laterality: Right;  3C   TONSILLECTOMY     Social History   Tobacco Use   Smoking status: Never    Passive exposure: Never   Smokeless tobacco: Never  Vaping Use   Vaping status: Never Used  Substance Use Topics   Alcohol use: Not Currently   Drug use: No   Family History  Problem Relation Age of Onset   Aneurysm Mother    Alcoholism Father    HIV/AIDS Brother 13       AIDS   Healthy Sister    Healthy Child    Peripheral vascular disease Other    Arthritis Other    Healthy Sister    Healthy Child    Healthy Child    Diabetes Neg Hx    Heart disease Neg Hx    Colon cancer Neg Hx    Prostate cancer Neg Hx    Allergies  Allergen Reactions   Penicillins Anaphylaxis and  Rash    Because of a history of documented adverse serious drug reaction;Medi Alert bracelet  is recommended PATIENT HAS HAD A PCN REACTION WITH IMMEDIATE RASH, FACIAL/TONGUE/THROAT SWELLING, SOB, OR LIGHTHEADEDNESS WITH HYPOTENSION:  #  #   YES  #  #  Has patient had a PCN reaction causing severe rash involving mucus membranes or skin necrosis: unknown Has patient had a PCN reaction that required hospitalization NO Has patient had a PCN reaction occurring within the last 10 years: NO   Klonopin  [Clonazepam ] Other (See Comments)    agitation   Peanut-Containing Drug Products Cough   Watermelon [Citrullus Vulgaris] Other (See Comments) and Cough    Tickle in throat   Current Outpatient Medications on File Prior to Visit  Medication Sig Dispense Refill   acetaminophen  (TYLENOL ) 500 MG tablet Take 1,000 mg by mouth at bedtime as needed for mild pain or headache.      albuterol  (VENTOLIN  HFA) 108 (90 Base) MCG/ACT inhaler TAKE 2 PUFFS BY MOUTH EVERY 6 HOURS AS NEEDED FOR WHEEZE OR SHORTNESS OF BREATH 18 each 2   allopurinol  (ZYLOPRIM ) 100 MG tablet Take 1.5 tablets (150 mg total) by mouth daily. 135 tablet 3   aspirin  EC 81 MG tablet Take 1 tablet (81 mg total) by mouth daily. Swallow whole.     Carbidopa -Levodopa  ER (SINEMET  CR) 25-100 MG tablet controlled release TAKE 2 TABLETS BY MOUTH 5 TIMES PER DAY AS DIRECTED 900 tablet 0   Cholecalciferol (VITAMIN D) 50 MCG (2000 UT) tablet Take 2,000 Units by mouth daily.      Continuous Blood Gluc Receiver (DEXCOM G6 RECEIVER) DEVI Use to check blood sugar daily 1 each 0   Continuous Glucose Sensor (DEXCOM G6 SENSOR) MISC USE TO CHECK BLOOD SUGAR DAILY 9 each 3   Continuous Glucose Transmitter (DEXCOM G6 TRANSMITTER) MISC Change every 3 months 1 each 3   dorzolamide-timolol (COSOPT) 22.3-6.8 MG/ML ophthalmic solution Place 1 drop into the right eye 2 (two) times daily.     DULoxetine  (CYMBALTA ) 20 MG capsule Take 1 capsule (20 mg total) by mouth daily. 30 capsule 0   fluticasone -salmeterol (WIXELA INHUB) 250-50 MCG/ACT AEPB INHALE 1 PUFF INTO THE LUNGS IN THE MORNING AND AT BEDTIME**RINSE AFTER USE** 180 each 1   FREESTYLE LITE test strip USE TO CHECK BLOOD SUGAR 4 TIMES DAILY. 150 strip 3    furosemide  (LASIX ) 20 MG tablet TAKE 1 (20MG ) TABLET AND 1/2TAB OF THE 80 MG TABLET ONCE DAILY TO EQUAL 60 MG DAILY 30 tablet 0   furosemide  (LASIX ) 80 MG tablet TAKE 1 TABLET BY MOUTH EVERY DAY 90 tablet 1   icosapent  Ethyl (VASCEPA ) 1 g capsule Take 2 capsules (2 g total) by mouth 2 (two) times daily. 360 capsule 3   Insulin  Disposable Pump (OMNIPOD 5 DEXG7G6 PODS GEN 5) MISC CHANGE POD EVERY 3 DAYS 30 each 3   Insulin  Disposable Pump (OMNIPOD 5 G6 INTRO, GEN 5,) KIT 1 kit by Does not apply route daily. 1 kit 0   insulin  lispro (HUMALOG ) 100 UNIT/ML injection USE MAXIMUM 76 UNITS PER   DAY WITH V-GO PUMP 60 mL 3   levothyroxine  (SYNTHROID ) 150 MCG tablet Take 1 tablet (150 mcg total) by mouth daily before breakfast. 90 tablet 3   losartan  (COZAAR ) 25 MG tablet Take 1 tablet (25 mg total) by mouth daily. 90 tablet 2   metoprolol  succinate (TOPROL -XL) 50 MG 24 hr tablet TAKE 1 TABLET BY MOUTH EVERY DAY WITH OR IMMEDIATELY FOLLOWING  A MEAL 90 tablet 1   nitroGLYCERIN  (NITROSTAT ) 0.4 MG SL tablet Place 1 tablet (0.4 mg total) under the tongue every 5 (five) minutes as needed for chest pain. 25 tablet 12   NUPLAZID  34 MG CAPS TAKE 1 CAPSULE BY MOUTH 1 TIME A DAY 90 capsule 0   OVER THE COUNTER MEDICATION Apply 1 application topically daily as needed (pain). Theraworx Pain Cream     polyethylene glycol (MIRALAX / GLYCOLAX) packet Take 8.5 g by mouth daily.     potassium chloride  SA (KLOR-CON  M) 20 MEQ tablet TAKE 40 MEQ BY MOUTH IN THE MORNING AND TAKE 20 MEQ IN THE EVENING 270 tablet 1   rosuvastatin  (CRESTOR ) 20 MG tablet TAKE 1 TAB BY MOUTH EVERYDAY AT BEDTIME 90 tablet 3   sennosides-docusate sodium  (SENOKOT-S) 8.6-50 MG tablet Take 1 tablet by mouth daily as needed for constipation. Take along with miralax.     spironolactone  (ALDACTONE ) 25 MG tablet TAKE 1/2 TABLET BY MOUTH EVERY DAY 45 tablet 1   tirzepatide  (MOUNJARO ) 7.5 MG/0.5ML Pen Inject 7.5 mg into the skin once a week. 6 mL 3   vitamin  B-12 (CYANOCOBALAMIN ) 1000 MCG tablet Take 1,000 mcg by mouth daily.     No current facility-administered medications on file prior to visit.    Review of Systems  Constitutional:  Positive for fatigue. Negative for fever.  Respiratory:  Negative for cough.   Cardiovascular:  Negative for chest pain.  Genitourinary:  Positive for dysuria, frequency and urgency.  Skin:  Positive for wound.  Neurological:  Positive for weakness and light-headedness.       Poor balance        Objective:   Physical Exam Constitutional:      General: He is not in acute distress.    Appearance: Normal appearance. He is well-developed. He is obese. He is not ill-appearing or diaphoretic.  HENT:     Head: Normocephalic and atraumatic.     Mouth/Throat:     Mouth: Mucous membranes are moist.  Eyes:     Conjunctiva/sclera: Conjunctivae normal.     Pupils: Pupils are equal, round, and reactive to light.  Neck:     Thyroid : No thyromegaly.     Vascular: No carotid bruit or JVD.  Cardiovascular:     Rate and Rhythm: Normal rate and regular rhythm.     Heart sounds: Normal heart sounds.     No gallop.  Pulmonary:     Effort: Pulmonary effort is normal. No respiratory distress.     Breath sounds: Normal breath sounds. No stridor. No wheezing, rhonchi or rales.  Abdominal:     General: There is no distension or abdominal bruit.     Palpations: Abdomen is soft.     Tenderness: There is no right CVA tenderness or left CVA tenderness.     Comments: No suprapubic tenderness or fullness    Musculoskeletal:     Cervical back: Normal range of motion and neck supple.     Right lower leg: No edema.     Left lower leg: No edema.  Lymphadenopathy:     Cervical: No cervical adenopathy.  Skin:    General: Skin is warm and dry.     Coloration: Skin is not pale.     Findings: No rash.     Comments: V shaped skin tear on right dorsal hand proximal to thumb  Oozing blood slightly  No swelling or redness   Minimal tenderness   Neurological:  Mental Status: He is alert.     Coordination: Coordination normal.     Deep Tendon Reflexes: Reflexes are normal and symmetric. Reflexes normal.     Comments: Bradykinesia  Slowed speech   Psychiatric:        Mood and Affect: Mood normal.           Assessment & Plan:   Problem List Items Addressed This Visit       Musculoskeletal and Integument   Skin tear of hand without complication   V shaped skin tear on dorsal right hand at base of thumb  Happened this am  Still oozing (mild) -pt is on asa Wife cleaned with soap and water Tetanus shot updated 2022   After consent obtained, dressed today with vaseline gauze, gauze pad and coban -providing light pressure  Pt tolerated this well  Wound care instructed -see AVS  Red flags for infx -see AVS Call back and Er precautions noted in detail today          Other   Urinary frequency - Primary   Multi factorial  Has seen urology  Diabetes, 2 diuretics , also parkinson's and mild BPH  Reviewed notes from pcp and urology Urinalysis clear today   Wife notes in past pt was much better with mybetriq but it then became unaffordable  Now they have paid deductible and can afford it again  Myrbetriq  25 mg sent to pharmacy  Instructed to call if side effects or problems  Instructed to update pcp in 2 weeks re: how symptoms are with option to titrate dose   Call back and Er precautions noted in detail today        Relevant Orders   POCT Urinalysis Dipstick (Automated) (Completed)

## 2023-08-03 NOTE — Assessment & Plan Note (Signed)
 Multi factorial  Has seen urology  Diabetes, 2 diuretics , also parkinson's and mild BPH  Reviewed notes from pcp and urology Urinalysis clear today   Wife notes in past pt was much better with mybetriq but it then became unaffordable  Now they have paid deductible and can afford it again  Myrbetriq  25 mg sent to pharmacy  Instructed to call if side effects or problems  Instructed to update pcp in 2 weeks re: how symptoms are with option to titrate dose   Call back and Er precautions noted in detail today

## 2023-08-03 NOTE — Assessment & Plan Note (Signed)
 V shaped skin tear on dorsal right hand at base of thumb  Happened this am  Still oozing (mild) -pt is on asa Wife cleaned with soap and water Tetanus shot updated 2022   After consent obtained, dressed today with vaseline gauze, gauze pad and coban -providing light pressure  Pt tolerated this well  Wound care instructed -see AVS  Red flags for infx -see AVS Call back and Er precautions noted in detail today

## 2023-08-03 NOTE — Patient Instructions (Signed)
 Start the [employer] your doctor know how it is working in the next several weeks   If symptoms worsen or new ones before then let us  know   For the skin tear Keep clean with gentle soap and water Leave this dressing for a day   Then change dressings daily  Use vaseline or aquapor to prevent sticking Then gauze and then coban wrap   Watch for redness/ swelling/ pain or other signs of infection

## 2023-08-08 ENCOUNTER — Other Ambulatory Visit: Payer: Self-pay | Admitting: Neurology

## 2023-08-09 ENCOUNTER — Encounter: Payer: Self-pay | Admitting: Physical Medicine & Rehabilitation

## 2023-08-09 ENCOUNTER — Encounter: Attending: Physical Medicine & Rehabilitation | Admitting: Physical Medicine & Rehabilitation

## 2023-08-09 VITALS — BP 116/67 | HR 70 | Ht 69.0 in | Wt 252.8 lb

## 2023-08-09 DIAGNOSIS — G248 Other dystonia: Secondary | ICD-10-CM | POA: Insufficient documentation

## 2023-08-09 DIAGNOSIS — G20B2 Parkinson's disease with dyskinesia, with fluctuations: Secondary | ICD-10-CM | POA: Diagnosis not present

## 2023-08-09 NOTE — Progress Notes (Signed)
 Subjective:    Patient ID: Henry [name], male    DOB: October 27, 1950, 73 y.o.   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  He underwent botulinum toxin injection of the following muscle groups on 06/28/2023 Number of units per muscle Right tibialis posterior 75 units Right peroneus longus 75 units Right gluteus medius 75 units Right Piriformis 75  No only is walking easier but also Right hip pain improved  Had a fall in the bathroom and scraped his right hand but no other injuries. He no longer drives.  His wife is with him today.  Blood pressure reviewed looked good. He does not note any new weakness in the right lower extremity since the injection. Pain Inventory Average Pain 0 Pain Right Now 0 My pain is No pain  In the last 24 hours, has pain interfered with the following? General activity 0 Relation with others 0 Enjoyment of life 0 What TIME of day is your pain at its worst? varies Sleep (in general) Fair  Pain is worse with: N/A Pain improves with: Tylenol  Relief from Meds: 10  Family History  Problem Relation Age of Onset   Aneurysm Mother    Alcoholism Father    HIV/AIDS Brother 64       AIDS   Healthy Sister    Healthy Child    Peripheral vascular disease Other    Arthritis Other    Healthy Sister    Healthy Child    Healthy Child    Diabetes Neg Hx    Heart disease Neg Hx    Colon cancer Neg Hx    Prostate cancer Neg Hx    Social History   Socioeconomic History   Marital status: Married    Spouse name: Henry [name]   Number of children: 2   Years of education: 12   Highest education level: High school graduate  Occupational History   Occupation: DISABLED    Comment: [employer]  Tobacco Use   Smoking status: Never    Passive exposure: Never   Smokeless tobacco: Never  Vaping Use   Vaping status: Never  Used  Substance and Sexual Activity   Alcohol use: Not Currently   Drug use: No   Sexual activity: Not Currently  Other Topics Concern   Not on file  Social History Narrative   From [place]   Retired/disability Archivist   Likes to fish.     Married 1972   3 kids   [place] fan   Social Drivers of Health   Financial Resource Strain: Low Risk  (04/05/2023)   Overall Financial Resource Strain (CARDIA)    Difficulty of Paying Living Expenses: Not hard at all  Food Insecurity: No Food Insecurity (04/05/2023)   Hunger Vital Sign    Worried About Running Out of Food in the Last Year: Never true    Ran Out of Food in the Last Year: Never true  Transportation Needs: No Transportation Needs (04/05/2023)   PRAPARE - Administrator, Civil Service (Medical): No    Lack of Transportation (Non-Medical): No  Physical Activity: Inactive (04/05/2023)   Exercise Vital Sign    Days of Exercise per Week: 0 days    Minutes of Exercise per Session: 0 min  Stress: No Stress Concern Present (04/05/2023)   [employer] of Occupational Health - Occupational Stress Questionnaire  Feeling of Stress : Not at all  Social Connections: Moderately Integrated (04/05/2023)   Social Connection and Isolation Panel    Frequency of Communication with Friends and Family: More than three times a week    Frequency of Social Gatherings with Friends and Family: More than three times a week    Attends Religious Services: 1 to 4 times per year    Active Member of [employer] or Organizations: No    Attends Banker Meetings: Never    Marital Status: Married   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   BIOPSY  10/14/2018   Procedure: BIOPSY;  Surgeon: Albertus Gordy HERO, MD;  Location: [facility];  Service: Gastroenterology;;   CATARACT EXTRACTION W/ INTRAOCULAR LENS IMPLANT     Hx: of right eye   CATARACT EXTRACTION  W/ INTRAOCULAR LENS IMPLANT Left 2018   COLONOSCOPY N/A 10/13/2014   Procedure: COLONOSCOPY;  Surgeon: Gordy HERO Albertus, MD;  Location: [facility];  Service: Gastroenterology;  Laterality: N/A;   COLONOSCOPY W/ BIOPSIES AND POLYPECTOMY     Hx: of   COLONOSCOPY WITH PROPOFOL  N/A 10/14/2018   Procedure: COLONOSCOPY WITH PROPOFOL ;  Surgeon: Albertus Gordy HERO, MD;  Location: [facility];  Service: Gastroenterology;  Laterality: N/A;   CORONARY ARTERY BYPASS GRAFT  2000   Sudie Laine, MD   CORONARY STENT PLACEMENT  1998   DEEP BRAIN STIMULATOR PLACEMENT  2004   Right and left VIN stimulator placement (parkinsons)   EYE SURGERY     Cataract removed in 1980s related to wood in eye when 73 years old   FINGER AMPUTATION     left pointer   IMPLANTABLE CARDIOVERTER DEFIBRILLATOR IMPLANT N/A 11/13/2013   Procedure: IMPLANTABLE CARDIOVERTER DEFIBRILLATOR IMPLANT;  Surgeon: Danelle LELON Birmingham, MD;  Location: [facility] CATH LAB;  Service: Cardiovascular;  Laterality: N/A;   INSERT / REPLACE / REMOVE PACEMAKER     medtronic   LEFT AND RIGHT HEART CATHETERIZATION WITH CORONARY ANGIOGRAM N/A 09/24/2013   Procedure: LEFT AND RIGHT HEART CATHETERIZATION WITH CORONARY ANGIOGRAM;  Surgeon: Lonni JONETTA Cash, MD;  Location: [facility] CATH LAB;  Service: Cardiovascular;  Laterality: N/A;   LITHOTRIPSY     3 different times   POLYPECTOMY  10/14/2018   Procedure: POLYPECTOMY;  Surgeon: Albertus Gordy HERO, MD;  Location: [facility];  Service: Gastroenterology;;   PULSE GENERATOR IMPLANT Right 11/13/2017   Procedure: Right chest implantable pulse generator change;  Surgeon: Unice Pac, MD;  Location: [facility] OR;  Service: Neurosurgery;  Laterality: Right;  Right chest implantable pulse generator change   SUBTHALAMIC STIMULATOR BATTERY REPLACEMENT N/A 09/05/2012   Procedure: Deep brain stimulator battery change;  Surgeon: Pac Unice, MD;  Location: [facility];  Service: Neurosurgery;  Laterality: N/A;  Deep brain stimulator battery  change   SUBTHALAMIC STIMULATOR BATTERY REPLACEMENT N/A 06/10/2015   Procedure: Deep Brain stimulator battery change;  Surgeon: Pac Unice, MD;  Location: [facility];  Service: Neurosurgery;  Laterality: N/A;   SUBTHALAMIC STIMULATOR BATTERY REPLACEMENT Right 09/25/2019   Procedure: Deep brain stimulator battery change;  Surgeon: Unice Pac, MD;  Location: [facility] OR;  Service: Neurosurgery;  Laterality: Right;   SUBTHALAMIC STIMULATOR BATTERY REPLACEMENT Right 12/15/2022   Procedure: IMPLANTABLE PULSE GENERATOR  BATTERY REPLACEMENT;  Surgeon: Carollee Lani BROCKS, DO;  Location: [facility] OR;  Service: Neurosurgery;  Laterality: Right;  3C   TONSILLECTOMY     Past Surgical History:  Procedure Laterality  Date   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   BIOPSY  10/14/2018   Procedure: BIOPSY;  Surgeon: Albertus Gordy HERO, MD;  Location: [facility];  Service: Gastroenterology;;   CATARACT EXTRACTION W/ INTRAOCULAR LENS IMPLANT     Hx: of right eye   CATARACT EXTRACTION W/ INTRAOCULAR LENS IMPLANT Left 2018   COLONOSCOPY N/A 10/13/2014   Procedure: COLONOSCOPY;  Surgeon: Gordy HERO Albertus, MD;  Location: [facility];  Service: Gastroenterology;  Laterality: N/A;   COLONOSCOPY W/ BIOPSIES AND POLYPECTOMY     Hx: of   COLONOSCOPY WITH PROPOFOL  N/A 10/14/2018   Procedure: COLONOSCOPY WITH PROPOFOL ;  Surgeon: Albertus Gordy HERO, MD;  Location: [facility];  Service: Gastroenterology;  Laterality: N/A;   CORONARY ARTERY BYPASS GRAFT  2000   Sudie Laine, MD   CORONARY STENT PLACEMENT  1998   DEEP BRAIN STIMULATOR PLACEMENT  2004   Right and left VIN stimulator placement (parkinsons)   EYE SURGERY     Cataract removed in 1980s related to wood in eye when 73 years old   FINGER AMPUTATION     left pointer   IMPLANTABLE CARDIOVERTER DEFIBRILLATOR IMPLANT N/A 11/13/2013   Procedure: IMPLANTABLE CARDIOVERTER DEFIBRILLATOR IMPLANT;  Surgeon: Danelle LELON Birmingham, MD;  Location: [facility] CATH LAB;  Service:  Cardiovascular;  Laterality: N/A;   INSERT / REPLACE / REMOVE PACEMAKER     medtronic   LEFT AND RIGHT HEART CATHETERIZATION WITH CORONARY ANGIOGRAM N/A 09/24/2013   Procedure: LEFT AND RIGHT HEART CATHETERIZATION WITH CORONARY ANGIOGRAM;  Surgeon: Lonni JONETTA Cash, MD;  Location: [facility] CATH LAB;  Service: Cardiovascular;  Laterality: N/A;   LITHOTRIPSY     3 different times   POLYPECTOMY  10/14/2018   Procedure: POLYPECTOMY;  Surgeon: Albertus Gordy HERO, MD;  Location: [facility];  Service: Gastroenterology;;   PULSE GENERATOR IMPLANT Right 11/13/2017   Procedure: Right chest implantable pulse generator change;  Surgeon: Unice Pac, MD;  Location: [facility] OR;  Service: Neurosurgery;  Laterality: Right;  Right chest implantable pulse generator change   SUBTHALAMIC STIMULATOR BATTERY REPLACEMENT N/A 09/05/2012   Procedure: Deep brain stimulator battery change;  Surgeon: Pac Unice, MD;  Location: [facility];  Service: Neurosurgery;  Laterality: N/A;  Deep brain stimulator battery change   SUBTHALAMIC STIMULATOR BATTERY REPLACEMENT N/A 06/10/2015   Procedure: Deep Brain stimulator battery change;  Surgeon: Pac Unice, MD;  Location: [facility];  Service: Neurosurgery;  Laterality: N/A;   SUBTHALAMIC STIMULATOR BATTERY REPLACEMENT Right 09/25/2019   Procedure: Deep brain stimulator battery change;  Surgeon: Unice Pac, MD;  Location: [facility] OR;  Service: Neurosurgery;  Laterality: Right;   SUBTHALAMIC STIMULATOR BATTERY REPLACEMENT Right 12/15/2022   Procedure: IMPLANTABLE PULSE GENERATOR  BATTERY REPLACEMENT;  Surgeon: Carollee Lani BROCKS, DO;  Location: [facility];  Service: Neurosurgery;  Laterality: Right;  3C   TONSILLECTOMY     Past Medical History:  Diagnosis Date   AICD (automatic cardioverter/defibrillator) present    Dr Birmingham office visit yearly, MDT  medtronic    Arthritis    cane   Asthma    CAD (coronary artery disease)    Cardiomyopathy    Cataract    removed   Complication of  anesthesia    pt states that he got a rash   Constipation    Deaf    right ear, 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)   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   Dysrhythmia  a-fib   GERD (gastroesophageal reflux disease)    Glaucoma    right eye   History of kidney stones    multiple   HLD (hyperlipidemia)    HTN (hypertension)    pt denies 08/19/12   Hyperplasia, prostate    Hypothyroidism    MI (myocardial infarction) [facility])    Dr Pietro 2000, [id number] bypass   Neuromuscular disorder (HCC)    Parkinson's Disease   OSA (obstructive sleep apnea)    AHI-28,on CPAP, noncompliant with CPAP   Parkinson disease (HCC)    1999   PONV (postoperative nausea and vomiting)    Restless legs    Shortness of breath    Hx: of at all times   UTI (lower urinary tract infection) 09/15/2012   Klebsiella   Ventral hernia    Walker as ambulation aid    also uses wheelchair at home/when going out   BP 116/67 (BP Location: Left Arm, Patient Position: Sitting, Cuff Size: Large)   Pulse 70   Ht 5' 9 (1.753 m)   Wt 252 lb 12.8 oz (114.7 kg)   SpO2 94%   BMI 37.33 kg/m   Opioid Risk Score:   Fall Risk Score:  `1  Depression screen [facility] 2/9     08/09/2023   12:44 PM 06/28/2023    3:03 PM 05/11/2023   11:48 AM 04/05/2023    3:48 PM 02/16/2023   12:55 PM 11/21/2022   12:45 PM 10/10/2022   11:26 AM  Depression screen PHQ 2/9  Decreased Interest 0 0 0 0 1 0 0  Down, Depressed, Hopeless 0 0 0 0 1 0 0  PHQ - 2 Score 0 0 0 0 2 0 0  Altered sleeping 0 0       Tired, decreased energy 0 0       Change in appetite 0 0       Feeling bad or failure about yourself  0 0       Trouble concentrating 0 0       Moving slowly or fidgety/restless 2 1       Suicidal thoughts 0 0       PHQ-9 Score 2 1       Difficult doing work/chores Very difficult Very difficult          Review of Systems  All other systems reviewed and are negative.      Objective:   Physical Exam Speech  with hyperkinetic hypophonic dysarthria Positive bradykinesia. Lower extremity strength is 4/5 hip flexor knee extensor ankle dorsiflexor No evidence of resting tremor Patient with 4 out of 5 hip abduction 3/5 foot eversion on the right side 4/5 inversion Ambulates with a rolling walker short step length normal base of support.  Turns with small stutter steps      Assessment & Plan:   1.  History of Parkinson's disease with decreased mobility he has concomitant focal dystonia affecting the right lower limb but has responded well to botulinum toxin injections.  His wife is quite happy with the changes in his ambulation.  He does not seem to be dragging his leg as much.  Patient also notes that his walking feels smoother. Will continue with current dose of botulinum toxin. He continues to have mobility problems and it has been a while since he has had physical therapy.  He has had a recent fall.  I do think he would benefit from a neuro rehab physical therapy program I will see  him back in 6 weeks for repeat injection same muscle doses and muscle group selection.  Will be using ultrasound guidance

## 2023-08-12 ENCOUNTER — Other Ambulatory Visit: Payer: Self-pay | Admitting: Neurology

## 2023-08-14 ENCOUNTER — Ambulatory Visit: Payer: Self-pay | Admitting: Endocrinology

## 2023-08-14 ENCOUNTER — Encounter: Payer: Self-pay | Admitting: Endocrinology

## 2023-08-14 ENCOUNTER — Ambulatory Visit (INDEPENDENT_AMBULATORY_CARE_PROVIDER_SITE_OTHER): Admitting: Endocrinology

## 2023-08-14 VITALS — BP 112/60 | HR 80 | Resp 16 | Ht 69.0 in | Wt 256.6 lb

## 2023-08-14 DIAGNOSIS — Z794 Long term (current) use of insulin: Secondary | ICD-10-CM

## 2023-08-14 DIAGNOSIS — E1165 Type 2 diabetes mellitus with hyperglycemia: Secondary | ICD-10-CM | POA: Diagnosis not present

## 2023-08-14 DIAGNOSIS — E890 Postprocedural hypothyroidism: Secondary | ICD-10-CM

## 2023-08-14 LAB — POCT GLYCOSYLATED HEMOGLOBIN (HGB A1C): Hemoglobin A1C: 6.7 % — AB (ref 4.0–5.6)

## 2023-08-14 NOTE — Progress Notes (Signed)
 Outpatient Endocrinology Note Henry Shameika Speelman, [name]  08/14/23  Patient's Name: Henry [name]    DOB: 06/02/1950    MRN: [id number]                                                    REASON OF VISIT: Follow-up for type 2 diabetes mellitus  PCP: Henry Arlyss RAMAN, [name]  HISTORY OF PRESENT ILLNESS:   Henry [name] is a 73 y.o. old male with past medical history listed below, is here for follow up of  type 2 diabetes mellitus / hypothyroidism.    Pertinent Diabetes History: Patient was diagnosed with type 2 diabetes mellitus in 2000.  He has been on OmniPod 5 insulin  pump with Dexcom G6.  Patient's wife is helping with insulin  pump management.  Patient has severe Parkinson's disease.  Chronic Diabetes Complications : Retinopathy: no. Last ophthalmology exam was done on annually reportedly. Nephropathy: CKD, on losartan , following with nephrology. Peripheral neuropathy:  yes, taking gabapentin  from PCP.  Has history of numbness in his toes. Coronary artery disease: CAD. Has CHF Stroke: no.  He has Parkinson's disease.  Relevant comorbidities and cardiovascular risk factors: Obesity: yes Body mass index is 37.89 kg/m.  Hypertension: yes Hyperlipidemia. Yes, on statin.   Current / Home Diabetic regimen includes:  Mounjaro  7.5 mg weekly.  OmniPod 5 insulin  Pump setting: Using Humalog  U100.  Wife helps him to manage insulin  pump at home. Basal ( 29.6) MN- 1.1 u/hour 3:30AM-1.1  6AM- 1.3 12PM- 1.3 10PM - 1.1  Bolus CHO Ratio (1unit:CHO) MN- 1:10  Generally Boluses 3-5 units for breakfast, 4-6 lunch and 6-7 units for dinner, 2-3 units for snacks . Generally entering about 45 -55- 65 g of carbohydrate for the 3 meals   Correction/Sensitivity: MN- 1:60  Target: 120   Active insulin  time: 2 hours  Prior diabetic medications: Amaryl  several years ago and probably metformin .  Janumet .  Victoza .  Lantus .  V-Go pump in 2016. Farxiga  was stopped when he had UTI in May  2022.  CONTINUOUS GLUCOSE MONITORING SYSTEM (CGMS) / INSULIN  PUMP INTERPRETATION:                         OmniPod 5 Pump & DEXCOM G6 Sensor Download (Reviewed and summarized below.) Pump: Dexcom G6 and OmniPod 5 Dates: July 23 to August 14, 2023 for 14 days.   Average total daily insulin :  42 units, Basal: 62% ( 26.4 units), Bolus: 38%.   Automated mode in Use: 99%     Interpretation : Mostly acceptable blood sugar with rare mild hyperglycemia with blood sugar in 200 mostly related to missing meal bolus.  Blood sugar overnight and in between the meals are acceptable.  No hypoglycemia.  GMI 7%.  Hypoglycemia: Patient has no hypoglycemic episodes. Patient has hypoglycemia awareness,.    Factors modifying glucose control: 1.  Diabetic diet assessment: 3 meals a day.  2.  Staying active or exercising: Not able to exercise due to medical condition.  3.  Medication compliance: compliant all of the time.  # Postablative hypothyroidism.  Patient has hypothyroidism secondary to radioactive iodine treatment for hyperthyroidism in 2013.   Interval history  Pump and CGM data as reviewed above.  Hemoglobin A1c 6.7% today.  Mostly acceptable blood sugar.  He has no  new complaints today.  He has been tolerating Mounjaro  well.  He has been taking levothyroxine  150 mcg daily.  Denies any palpitation or heat intolerance.  He is accompanied by wife in the clinic today.  REVIEW OF SYSTEMS As per history of present illness.   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)   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    Dysrhythmia    a-fib   GERD (gastroesophageal reflux disease)    Glaucoma    right eye   History of kidney stones    multiple   HLD (hyperlipidemia)    HTN (hypertension)    pt denies 08/19/12   Hyperplasia, prostate    Hypothyroidism    MI (myocardial infarction) (HCC)    Dr Pietro 2000, x3vessels bypass   Neuromuscular disorder (HCC)    Parkinson's Disease   OSA (obstructive sleep apnea)    AHI-28,on CPAP, noncompliant with CPAP   Parkinson disease (HCC)    1999   PONV (postoperative nausea and vomiting)    Restless legs    Shortness of breath    Hx: of at all times   UTI (lower urinary tract infection) 09/15/2012   Klebsiella   Ventral hernia    Walker as ambulation aid    also uses wheelchair at home/when going out    PAST SURGICAL HISTORY: 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   BIOPSY  10/14/2018   Procedure: BIOPSY;  Surgeon: Henry Henry HERO, [name];  Location: [facility];  Service: Gastroenterology;;   CATARACT EXTRACTION W/ INTRAOCULAR LENS IMPLANT     Hx: of right eye   CATARACT EXTRACTION W/ INTRAOCULAR LENS IMPLANT Left 2018   COLONOSCOPY N/A 10/13/2014   Procedure: COLONOSCOPY;  Surgeon: Henry [name];  Location: [facility];  Service: Gastroenterology;  Laterality: N/A;   COLONOSCOPY W/ BIOPSIES AND POLYPECTOMY     Hx: of   COLONOSCOPY WITH PROPOFOL  N/A 10/14/2018   Procedure: COLONOSCOPY WITH PROPOFOL ;  Surgeon: Henry Henry HERO, [name];  Location: [facility];  Service: Gastroenterology;  Laterality: N/A;   CORONARY ARTERY BYPASS GRAFT  2000   Henry Laine, [name]   CORONARY STENT PLACEMENT  1998   DEEP BRAIN STIMULATOR PLACEMENT  2004   Right and left VIN stimulator placement (parkinsons)   EYE SURGERY     Cataract removed in 1980s related to wood in eye when 73 years old   FINGER AMPUTATION     left pointer   IMPLANTABLE CARDIOVERTER DEFIBRILLATOR IMPLANT N/A 11/13/2013   Procedure:  IMPLANTABLE CARDIOVERTER DEFIBRILLATOR IMPLANT;  Surgeon: Henry [name];  Location: [facility] CATH LAB;  Service: Cardiovascular;  Laterality: N/A;   INSERT / REPLACE / REMOVE PACEMAKER     medtronic   LEFT AND RIGHT HEART CATHETERIZATION WITH CORONARY ANGIOGRAM N/A 09/24/2013   Procedure: LEFT AND RIGHT HEART CATHETERIZATION WITH CORONARY ANGIOGRAM;  Surgeon: Lonni JONETTA Cash, [name];  Location: [facility] CATH  LAB;  Service: Cardiovascular;  Laterality: N/A;   LITHOTRIPSY     3 different times   POLYPECTOMY  10/14/2018   Procedure: POLYPECTOMY;  Surgeon: Henry Henry HERO, [name];  Location: [facility];  Service: Gastroenterology;;   PULSE GENERATOR IMPLANT Right 11/13/2017   Procedure: Right chest implantable pulse generator change;  Surgeon: Unice Pac, [name];  Location: [facility] OR;  Service: Neurosurgery;  Laterality: Right;  Right chest implantable pulse generator change   SUBTHALAMIC STIMULATOR BATTERY REPLACEMENT N/A 09/05/2012   Procedure: Deep brain stimulator battery change;  Surgeon: Pac Unice, [name];  Location: [facility];  Service: Neurosurgery;  Laterality: N/A;  Deep brain stimulator battery change   SUBTHALAMIC STIMULATOR BATTERY REPLACEMENT N/A 06/10/2015   Procedure: Deep Brain stimulator battery change;  Surgeon: Pac Unice, [name];  Location: [facility];  Service: Neurosurgery;  Laterality: N/A;   SUBTHALAMIC STIMULATOR BATTERY REPLACEMENT Right 09/25/2019   Procedure: Deep brain stimulator battery change;  Surgeon: Unice Pac, [name];  Location: [facility] OR;  Service: Neurosurgery;  Laterality: Right;   SUBTHALAMIC STIMULATOR BATTERY REPLACEMENT Right 12/15/2022   Procedure: IMPLANTABLE PULSE GENERATOR  BATTERY REPLACEMENT;  Surgeon: Carollee Lani BROCKS, DO;  Location: [facility] OR;  Service: Neurosurgery;  Laterality: Right;  3C   TONSILLECTOMY      ALLERGIES: Allergies  Allergen Reactions   Penicillins Anaphylaxis and Rash    Because of a history of documented adverse serious drug reaction;Medi Alert  bracelet  is recommended PATIENT HAS HAD A PCN REACTION WITH IMMEDIATE RASH, FACIAL/TONGUE/THROAT SWELLING, SOB, OR LIGHTHEADEDNESS WITH HYPOTENSION:  #  #  YES  #  #  Has patient had a PCN reaction causing severe rash involving mucus membranes or skin necrosis: unknown Has patient had a PCN reaction that required hospitalization NO Has patient had a PCN reaction occurring within the last 10 years: NO   Klonopin  [Clonazepam ] Other (See Comments)    agitation   Peanut-Containing Drug Products Cough   Watermelon [Citrullus Vulgaris] Other (See Comments) and Cough    Tickle in throat    FAMILY HISTORY:  Family History  Problem Relation Age of Onset   Aneurysm Mother    Alcoholism Father    HIV/AIDS Brother 6       AIDS   Healthy Sister    Healthy Child    Peripheral vascular disease Other    Arthritis Other    Healthy Sister    Healthy Child    Healthy Child    Diabetes Neg Hx    Heart disease Neg Hx    Colon cancer Neg Hx    Prostate cancer Neg Hx     SOCIAL HISTORY: Social History   Socioeconomic History   Marital status: Married    Spouse name: [name]   Number of children: 2   Years of education: 12   Highest education level: High school graduate  Occupational History   Occupation: DISABLED    Comment: [employer], [employer]  Tobacco Use   Smoking status: Never    Passive exposure: Never   Smokeless tobacco: Never  Vaping Use   Vaping status: Never Used  Substance and Sexual Activity   Alcohol use: Not Currently   Drug use: No   Sexual activity: Not Currently  Other Topics Concern   Not on file  Social History Narrative   From [place]   Retired/disability Archivist   Likes to fish.     Married 1972   3 kids   [place] fan  Social Drivers of Corporate investment banker Strain: Low Risk  (04/05/2023)   Overall Financial Resource Strain (CARDIA)    Difficulty of Paying Living Expenses: Not hard at all  Food Insecurity: No Food  Insecurity (04/05/2023)   Hunger Vital Sign    Worried About Running Out of Food in the Last Year: Never true    Ran Out of Food in the Last Year: Never true  Transportation Needs: No Transportation Needs (04/05/2023)   PRAPARE - Administrator, Civil Service (Medical): No    Lack of Transportation (Non-Medical): No  Physical Activity: Inactive (04/05/2023)   Exercise Vital Sign    Days of Exercise per Week: 0 days    Minutes of Exercise per Session: 0 min  Stress: No Stress Concern Present (04/05/2023)   [employer] of Occupational Health - Occupational Stress Questionnaire    Feeling of Stress : Not at all  Social Connections: Moderately Integrated (04/05/2023)   Social Connection and Isolation Panel    Frequency of Communication with Friends and Family: More than three times a week    Frequency of Social Gatherings with Friends and Family: More than three times a week    Attends Religious Services: 1 to 4 times per year    Active Member of [employer] or Organizations: No    Attends Banker Meetings: Never    Marital Status: Married    MEDICATIONS:  Current Outpatient Medications  Medication Sig Dispense Refill   acetaminophen  (TYLENOL ) 500 MG tablet Take 1,000 mg by mouth at bedtime as needed for mild pain or headache.      albuterol  (VENTOLIN  HFA) 108 (90 Base) MCG/ACT inhaler TAKE 2 PUFFS BY MOUTH EVERY 6 HOURS AS NEEDED FOR WHEEZE OR SHORTNESS OF BREATH 18 each 2   allopurinol  (ZYLOPRIM ) 100 MG tablet Take 1.5 tablets (150 mg total) by mouth daily. 135 tablet 3   aspirin  EC 81 MG tablet Take 1 tablet (81 mg total) by mouth daily. Swallow whole.     Carbidopa -Levodopa  ER (SINEMET  CR) 25-100 MG tablet controlled release TAKE 2 TABLETS BY MOUTH 5 TIMES PER DAY AS DIRECTED 900 tablet 0   Cholecalciferol (VITAMIN D) 50 MCG (2000 UT) tablet Take 2,000 Units by mouth daily.      Continuous Blood Gluc Receiver (DEXCOM G6 RECEIVER) DEVI Use to check blood sugar  daily 1 each 0   Continuous Glucose Sensor (DEXCOM G6 SENSOR) MISC USE TO CHECK BLOOD SUGAR DAILY 9 each 3   Continuous Glucose Transmitter (DEXCOM G6 TRANSMITTER) MISC Change every 3 months 1 each 3   dorzolamide-timolol (COSOPT) 22.3-6.8 MG/ML ophthalmic solution Place 1 drop into the right eye 2 (two) times daily.     DULoxetine  (CYMBALTA ) 20 MG capsule Take 1 capsule (20 mg total) by mouth daily. 30 capsule 0   fluticasone -salmeterol (WIXELA INHUB) 250-50 MCG/ACT AEPB INHALE 1 PUFF INTO THE LUNGS IN THE MORNING AND AT BEDTIME**RINSE AFTER USE** 180 each 1   FREESTYLE LITE test strip USE TO CHECK BLOOD SUGAR 4 TIMES DAILY. 150 strip 3   furosemide  (LASIX ) 20 MG tablet TAKE 1 (20MG ) TABLET AND 1/2TAB OF THE 80 MG TABLET ONCE DAILY TO EQUAL 60 MG DAILY 30 tablet 0   furosemide  (LASIX ) 80 MG tablet TAKE 1 TABLET BY MOUTH EVERY DAY 90 tablet 1   icosapent  Ethyl (VASCEPA ) 1 g capsule Take 2 capsules (2 g total) by mouth 2 (two) times daily. 360 capsule 3   Insulin  Disposable Pump (  OMNIPOD 5 DEXG7G6 PODS GEN 5) MISC CHANGE POD EVERY 3 DAYS 30 each 3   Insulin  Disposable Pump (OMNIPOD 5 G6 INTRO, GEN 5,) KIT 1 kit by Does not apply route daily. 1 kit 0   insulin  lispro (HUMALOG ) 100 UNIT/ML injection USE MAXIMUM 76 UNITS PER   DAY WITH V-GO PUMP 60 mL 3   levothyroxine  (SYNTHROID ) 150 MCG tablet Take 1 tablet (150 mcg total) by mouth daily before breakfast. 90 tablet 3   losartan  (COZAAR ) 25 MG tablet Take 1 tablet (25 mg total) by mouth daily. 90 tablet 2   metoprolol  succinate (TOPROL -XL) 50 MG 24 hr tablet TAKE 1 TABLET BY MOUTH EVERY DAY WITH OR IMMEDIATELY FOLLOWING A MEAL 90 tablet 1   mirabegron  ER (MYRBETRIQ ) 25 MG TB24 tablet Take 1 tablet (25 mg total) by mouth daily. 30 tablet 0   nitroGLYCERIN  (NITROSTAT ) 0.4 MG SL tablet Place 1 tablet (0.4 mg total) under the tongue every 5 (five) minutes as needed for chest pain. 25 tablet 12   NUPLAZID  34 MG CAPS TAKE 1 CAPSULE BY MOUTH 1 TIME A DAY 90  capsule 0   OVER THE COUNTER MEDICATION Apply 1 application topically daily as needed (pain). Theraworx Pain Cream     polyethylene glycol (MIRALAX / GLYCOLAX) packet Take 8.5 g by mouth daily.     potassium chloride  SA (KLOR-CON  M) 20 MEQ tablet TAKE 40 MEQ BY MOUTH IN THE MORNING AND TAKE 20 MEQ IN THE EVENING 270 tablet 1   rosuvastatin  (CRESTOR ) 20 MG tablet TAKE 1 TAB BY MOUTH EVERYDAY AT BEDTIME 90 tablet 3   sennosides-docusate sodium  (SENOKOT-S) 8.6-50 MG tablet Take 1 tablet by mouth daily as needed for constipation. Take along with miralax.     spironolactone  (ALDACTONE ) 25 MG tablet TAKE 1/2 TABLET BY MOUTH EVERY DAY 45 tablet 1   tirzepatide  (MOUNJARO ) 7.5 MG/0.5ML Pen Inject 7.5 mg into the skin once a week. 6 mL 3   vitamin B-12 (CYANOCOBALAMIN ) 1000 MCG tablet Take 1,000 mcg by mouth daily.     No current facility-administered medications for this visit.    PHYSICAL EXAM: Vitals:   08/14/23 1415  BP: 112/60  Pulse: 80  Resp: 16  SpO2: 99%  Weight: 256 lb 9.6 oz (116.4 kg)  Height: 5' 9 (1.753 m)     Body mass index is 37.89 kg/m.  Wt Readings from Last 3 Encounters:  08/14/23 256 lb 9.6 oz (116.4 kg)  08/09/23 252 lb 12.8 oz (114.7 kg)  08/03/23 251 lb 4.8 oz (114 kg)    General: Well developed, well nourished male in no apparent distress.  HEENT: AT/Purdy, no external lesions.  Eyes: Conjunctiva clear and no icterus. Neck: Neck supple  Lungs: Respirations not labored Neurologic: Alert, oriented, normal speech Extremities / Skin: Dry.  Psychiatric: Does not appear depressed or anxious  Diabetic Foot Exam - Simple   No data filed    LABS Reviewed Lab Results  Component Value Date   HGBA1C 6.7 (A) 08/14/2023   HGBA1C 6.8 (A) 04/30/2023   HGBA1C 6.9 (A) 01/30/2023   Lab Results  Component Value Date   FRUCTOSAMINE 359 (H) 07/01/2019   FRUCTOSAMINE 277 11/09/2017   FRUCTOSAMINE 350 (H) 03/29/2017   Lab Results  Component Value Date   CHOL 96  04/06/2023   HDL 30.10 (L) 04/06/2023   LDLCALC 45 04/06/2023   LDLDIRECT 51.0 04/24/2019   TRIG 106.0 04/06/2023   CHOLHDL 3 04/06/2023   Lab Results  Component  Value Date   MICRALBCREAT 11.0 10/24/2022   MICRALBCREAT 0.4 11/01/2009   Lab Results  Component Value Date   CREATININE 1.32 05/29/2023   Lab Results  Component Value Date   GFR 54.14 (L) 05/29/2023    ASSESSMENT / PLAN  1. Type 2 diabetes mellitus with hyperglycemia, with long-term current use of insulin  (HCC)   2. Hypothyroidism following radioiodine therapy     Diabetes Mellitus type 2, complicated by diabetic neuropathy/CKD//CAD - Diabetic status / severity: Controlled.  Lab Results  Component Value Date   HGBA1C 6.7 (A) 08/14/2023    - Hemoglobin A1c goal <6.5%   - Medications: No change in the pump setting today.  Advised for meal bolus for all the meals ideally 15 minutes before eating.  -Continue Mounjaro  7.5 mg weekly.  - Home glucose testing: continue CGM /Dexcom G6 and check blood glucose as needed.  - Discussed/ Gave Hypoglycemia treatment plan.  # Consult : not required at this time.   # Annual urine for microalbuminuria/ creatinine ratio, no microalbuminuria currently, continue ACE/ARB /losartan .  Following with nephrology.   Last  Lab Results  Component Value Date   MICRALBCREAT 11.0 10/24/2022    # Foot check nightly / neuropathy, continue gabapentin  per PCP.  # Annual dilated diabetic eye exams.   - Diet: Make healthy diabetic food choices.  2. Blood pressure  -  BP Readings from Last 1 Encounters:  08/14/23 112/60    - Control is in target.  - No change in current plans.  3. Lipid status / Hyperlipidemia - Last  Lab Results  Component Value Date   LDLCALC 45 04/06/2023   - Continue rosuvastatin  20 mg daily, continue Vascepa .  # Postablative hypothyroidism. -Patient is currently taking levothyroxine  150 mcg daily.  Continue the same.  Annual thyroid   lab.  Diagnoses and all orders for this visit:  Type 2 diabetes mellitus with hyperglycemia, with long-term current use of insulin  (HCC) -     POCT glycosylated hemoglobin (Hb A1C)  Hypothyroidism following radioiodine therapy   DISPOSITION Follow up in clinic in 3  months suggested.   All questions answered and patient verbalized understanding of the plan.  Henry Coltyn Hanning, [name] [facility] [address] [place], [address] [place], [place] [address] Phone # [phone]  At least part of this note was generated using voice recognition software. Inadvertent word errors may have occurred, which were not recognized during the proofreading process.

## 2023-08-16 ENCOUNTER — Other Ambulatory Visit: Payer: Self-pay | Admitting: Cardiology

## 2023-08-21 ENCOUNTER — Encounter: Payer: Self-pay | Admitting: Family Medicine

## 2023-08-21 NOTE — Telephone Encounter (Signed)
 Saw Dr. Randeen for acute issue on 08/03/23, will route to PCP to see if he wants pt to continue med long term. Please review mychart message

## 2023-08-22 ENCOUNTER — Other Ambulatory Visit: Payer: Self-pay | Admitting: Family Medicine

## 2023-08-22 MED ORDER — MIRABEGRON ER 25 MG PO TB24: 25.0000 mg | ORAL_TABLET | Freq: Every day | ORAL | 3 refills | Status: DC

## 2023-08-23 ENCOUNTER — Telehealth: Payer: Self-pay | Admitting: Physical Therapy

## 2023-08-23 ENCOUNTER — Ambulatory Visit: Attending: Physical Medicine & Rehabilitation | Admitting: Physical Therapy

## 2023-08-23 ENCOUNTER — Encounter: Payer: Self-pay | Admitting: Physical Therapy

## 2023-08-23 DIAGNOSIS — Z9181 History of falling: Secondary | ICD-10-CM | POA: Diagnosis not present

## 2023-08-23 DIAGNOSIS — G248 Other dystonia: Secondary | ICD-10-CM | POA: Insufficient documentation

## 2023-08-23 DIAGNOSIS — G20B2 Parkinson's disease with dyskinesia, with fluctuations: Secondary | ICD-10-CM

## 2023-08-23 DIAGNOSIS — M6281 Muscle weakness (generalized): Secondary | ICD-10-CM | POA: Diagnosis not present

## 2023-08-23 DIAGNOSIS — R2689 Other abnormalities of gait and mobility: Secondary | ICD-10-CM | POA: Diagnosis not present

## 2023-08-23 DIAGNOSIS — R293 Abnormal posture: Secondary | ICD-10-CM | POA: Insufficient documentation

## 2023-08-23 DIAGNOSIS — R278 Other lack of coordination: Secondary | ICD-10-CM | POA: Diagnosis not present

## 2023-08-23 NOTE — Telephone Encounter (Signed)
 Dr. Carilyn,  [name] KANDICE Hales  was evaluated by PT on 08/23/2023.  The patient would benefit from an OT evaluation for PD.    If you agree, please place an order in [facility] workque in [facility] or fax the order to [phone].  Thank you,   Marlon FORBES Dural, PT, DPT [facility] [address] [place], [place]  [address] Phone:  [phone] Fax:  [phone]

## 2023-08-23 NOTE — Therapy (Signed)
 OUTPATIENT PHYSICAL THERAPY NEURO EVALUATION   Patient Name: Henry [name] MRN: [id number] DOB:Dec 15, 1950, 73 y.o., male Today's Date: 08/23/2023   PCP: Cleatus Arlyss RAMAN, MD  REFERRING PROVIDER: Carilyn Prentice BRAVO, MD  END OF SESSION:  PT End of Session - 08/23/23 1405     Visit Number 1    Number of Visits 13    Date for PT Re-Evaluation 10/11/23    Authorization Type Medicare    PT Start Time 1402    PT Stop Time 1438    PT Time Calculation (min) 36 min    Activity Tolerance Patient tolerated treatment well    Behavior During Therapy WFL for tasks assessed/performed          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)   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   Dysrhythmia    a-fib   GERD (gastroesophageal reflux disease)    Glaucoma    right eye   History of kidney stones    multiple   HLD (hyperlipidemia)    HTN (hypertension)    pt denies 08/19/12   Hyperplasia, prostate    Hypothyroidism    MI (myocardial infarction) (HCC)    Dr Pietro 2000, x3vessels bypass   Neuromuscular disorder (HCC)    Parkinson's Disease   OSA (obstructive sleep apnea)    AHI-28,on CPAP, noncompliant with CPAP   Parkinson disease (HCC)    1999   PONV (postoperative nausea and vomiting)    Restless legs    Shortness of breath    Hx: of at all times   UTI (lower urinary tract infection) 09/15/2012   Klebsiella   Ventral hernia    Walker as ambulation aid    also uses wheelchair at home/when going out   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    BIOPSY  10/14/2018   Procedure: BIOPSY;  Surgeon: Albertus Gordy HERO, MD;  Location: [facility];  Service: Gastroenterology;;   CATARACT EXTRACTION W/ INTRAOCULAR LENS IMPLANT     Hx: of right eye   CATARACT EXTRACTION W/ INTRAOCULAR LENS IMPLANT Left 2018   COLONOSCOPY N/A 10/13/2014   Procedure: COLONOSCOPY;  Surgeon: Gordy HERO Albertus, MD;  Location: [facility];  Service: Gastroenterology;  Laterality: N/A;   COLONOSCOPY W/ BIOPSIES AND POLYPECTOMY     Hx: of   COLONOSCOPY WITH PROPOFOL  N/A 10/14/2018   Procedure: COLONOSCOPY WITH PROPOFOL ;  Surgeon: Albertus Gordy HERO, MD;  Location: [facility];  Service: Gastroenterology;  Laterality: N/A;   CORONARY ARTERY BYPASS GRAFT  2000   Sudie Laine, MD   CORONARY STENT PLACEMENT  1998   DEEP BRAIN STIMULATOR PLACEMENT  2004   Right and left VIN stimulator placement (parkinsons)   EYE SURGERY     Cataract removed in 1980s related to wood in eye when 73 years old   FINGER AMPUTATION     left pointer   IMPLANTABLE CARDIOVERTER DEFIBRILLATOR IMPLANT N/A 11/13/2013  Procedure: IMPLANTABLE CARDIOVERTER DEFIBRILLATOR IMPLANT;  Surgeon: Danelle LELON Birmingham, MD;  Location: [facility] C[facility] LAB;  Service: Cardiovascular;  Laterality: N/A;   INSERT / REPLACE / REMOVE PACEMAKER     medtronic   LEFT AND RIGHT HEART CATHETERIZATION WITH CORONARY ANGIOGRAM N/A 09/24/2013   Procedure: LEFT AND RIGHT HEART CATHETERIZATION WITH CORONARY ANGIOGRAM;  Surgeon: Lonni JONETTA Cash, MD;  Location: [facility];  Service: Cardiovascular;  Laterality: N/A;   LITHOTRIPSY     3 different times   POLYPECTOMY  10/14/2018   Procedure: POLYPECTOMY;  Surgeon: Albertus Gordy HERO, MD;  Location: [facility];  Service: Gastroenterology;;   PULSE GENERATOR IMPLANT Right 11/13/2017   Procedure: Right chest implantable pulse generator change;  Surgeon: Unice Pac, MD;  Location: [facility] OR;  Service: Neurosurgery;  Laterality: Right;  Right chest implantable pulse generator change   SUBTHALAMIC  STIMULATOR BATTERY REPLACEMENT N/A 09/05/2012   Procedure: Deep brain stimulator battery change;  Surgeon: Pac Unice, MD;  Location: [facility] NEURO ORS;  Service: Neurosurgery;  Laterality: N/A;  Deep brain stimulator battery change   SUBTHALAMIC STIMULATOR BATTERY REPLACEMENT N/A 06/10/2015   Procedure: Deep Brain stimulator battery change;  Surgeon: Pac Unice, MD;  Location: [facility];  Service: Neurosurgery;  Laterality: N/A;   SUBTHALAMIC STIMULATOR BATTERY REPLACEMENT Right 09/25/2019   Procedure: Deep brain stimulator battery change;  Surgeon: Unice Pac, MD;  Location: [facility] OR;  Service: Neurosurgery;  Laterality: Right;   SUBTHALAMIC STIMULATOR BATTERY REPLACEMENT Right 12/15/2022   Procedure: IMPLANTABLE PULSE GENERATOR  BATTERY REPLACEMENT;  Surgeon: Carollee Lani BROCKS, DO;  Location: [facility] OR;  Service: Neurosurgery;  Laterality: Right;  3C   TONSILLECTOMY     Patient Active Problem List   Diagnosis Date Noted   Skin tear of hand without complication 08/03/2023   Delirium 05/29/2023   Healthcare maintenance 04/08/2023   Cerumen impaction 04/08/2023   Hordeolum 03/21/2023   Pancreatic cyst 03/05/2022   Lower respiratory infection 02/20/2022   Lobar pneumonia (HCC) 02/20/2022   Sore throat 01/12/2022   Cyst of perianal area 01/12/2022   Strep pharyngitis 12/16/2021   Wheezing 12/16/2021   DOE (dyspnea on exertion) 12/16/2021   Parkinson's disease with EBS (electrical brain stimulation) [G20.A1] 12/05/2021   Fall at home 06/15/2021   Asthma    Skin lesion 05/30/2019   CAD (coronary artery disease) 08/25/2018   Diabetes mellitus without complication (HCC) 08/15/2018   Thrombocytopenia (HCC) 07/08/2017   Gout, unspecified 06/06/2017   Dysuria 05/25/2017   Microscopic hematuria 05/25/2017   CKD (chronic kidney disease) stage 3, GFR 30-59 ml/min (HCC) 04/12/2017   Neuropathy 02/25/2017   Urinary frequency 09/19/2016   Constipation 11/27/2015   Medicare annual wellness visit,  initial 09/23/2015   Advance care planning 09/23/2015   Spinal stenosis of lumbar region 01/26/2015   History of colonic polyps    Restless leg syndrome 08/11/2014   ICD- MDT, implanted 11/13/13 11/14/2013   Chronic systolic CHF (congestive heart failure) (HCC) 11/13/2013   SOB (shortness of breath), mostly on exertion 06/05/2013   Acute on chronic combined systolic and diastolic congestive heart failure, NYHA class 4 (HCC) 05/12/2013   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   Umbilical hernia 01/27/2013   Elevated PSA 12/04/2012   Other malaise and fatigue 12/04/2012   PD (Parkinson's disease) (HCC) 08/19/2012   OSA on CPAP 04/17/2012   Dysautonomia (HCC) 04/17/2012   Obesity, morbid (HCC) 04/17/2012   Hypothyroidism following radioiodine therapy 10/16/2011   GERD 05/11/2009   Type 2 diabetes mellitus  with chronic kidney disease, with long-term current use of insulin  (HCC) 04/26/2009   Back pain 08/18/2008   OTHER SPEC FORMS CHRONIC ISCHEMIC HEART DISEASE 04/24/2008   HYPERPLASIA PROSTATE UNS W/O UR OBST & OTH LUTS 09/02/2007   NEPHROLITHIASIS, HX OF 09/02/2007   Ventral hernia 12/28/2006   Hyperlipidemia 08/31/2006   HTN (hypertension) 08/31/2006   Hx of CABG x 3 in 2000 08/31/2006    ONSET DATE: 08/09/2023 (referral)   REFERRING DIAG: G24.8 (ICD-10-CM) - 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 Pt  reports he has had two falls recently, most recently when reaching for the cup holder on his walker and fell forward. Pt requires assistance to get up when he falls, but wife reports if pt can get into quadruped she can get him up. Pt uses a RW inside the house if in more narrow doorways but will use the U-Step around the kitchen and living room. Uses a rollator on unlevel terrain outside. Has not been to PT in a while. Receives botox  injections by Dr. Carilyn to R ankle/foot to assist w/dystonia. Pt reports his dystonia kicks in when he is walking and causes his balance to be off.   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 No  PRECAUTIONS: Fall, ICD/Pacemaker, and Other: DBS  RED FLAGS: None   WEIGHT BEARING RESTRICTIONS: No  FALLS: Has patient fallen in last 6 months? Yes. Number of falls 2  LIVING ENVIRONMENT: Lives with: lives with their spouse and Oldest Daughter and her husband Lives in: House/apartment Stairs: Ramped entry  Has following equipment at home: Environmental consultant - 2 wheeled, Environmental consultant - 4 wheeled, Wheelchair (manual), [employer], Grab bars, and U-Step walker   PLOF: Requires assistive device for independence, Needs assistance with ADLs, Needs assistance with gait, and Needs assistance with transfers  PATIENT GOALS: I don't know I just want to keep my strength  OBJECTIVE:  Note: Objective measures were completed at Evaluation unless otherwise noted.  DIAGNOSTIC FINDINGS: None relevant   COGNITION: Overall cognitive status: History of cognitive impairments - at baseline   SENSATION: Pt denied numbness/tingling in BLEs  COORDINATION: Dystonia in LLE    POSTURE: rounded shoulders, forward head, increased thoracic kyphosis, posterior pelvic tilt, and flexed trunk   LOWER EXTREMITY ROM:     Active  Right Eval  Left Eval  Hip flexion    Hip extension    Hip abduction    Hip adduction    Hip internal rotation    Hip external rotation    Knee flexion    Knee extension    Ankle dorsiflexion    Ankle plantarflexion    Ankle inversion    Ankle eversion     (Blank rows = not tested)  LOWER EXTREMITY MMT:    MMT Right Eval Left Eval  Hip flexion    Hip extension    Hip abduction    Hip adduction    Hip internal rotation    Hip external rotation    Knee flexion    Knee extension    Ankle dorsiflexion    Ankle plantarflexion    Ankle inversion    Ankle eversion    (Blank rows = not tested)  BED MOBILITY:  Not tested Pt frequently sleeps in recliner (lift chair)   TRANSFERS: Sit to stand: CGA  Assistive device utilized: U-Step     Stand to sit: CGA and Min A  Assistive device utilized: U-step      Pt does not fully turn to sit and pushes U-Step away from body, requiring min A to ensure pt did not miss seat or fall forwards when pushing U-Step away. Pt reports he did this because he was fatigued. Pt also demonstrates poor anterior weight shift w/heavy posterior bracing on chair to stand.   RAMP:  Not tested  CURB:  Not tested  STAIRS: Not tested GAIT: Gait pattern: step to pattern, decreased stride length, decreased hip/knee flexion- Right, decreased hip/knee flexion- Left, decreased ankle dorsiflexion- Right, decreased ankle dorsiflexion- Left, shuffling, trunk flexed, wide BOS, poor foot clearance- Right, and poor foot clearance- Left Distance walked: Various short clinic distances  Assistive device utilized: U-Step  Level of assistance: CGA Comments: Pt assumes severe forward-flexed posture w/arms extended on walker. Pt reports he does not like standing upright due to back pain, but wife reports she would like pt to work on this.    FUNCTIONAL TESTS:   [facility] PT Assessment - 08/23/23 1421       Transfers   Five time sit to stand comments  17.34s   BUE support,  dyskinesia of RLE     Ambulation/Gait   Gait velocity 32.8' over 20.06s = 1.64 ft/s with U-step  TREATMENT :   Self-care/home management Lengthy discussion regarding importance of performing exercise at home, postural control and fully turning prior to sitting down. Pt reports he is willing to work on these things at home.   PATIENT EDUCATION: Education details: POC, eval findings, see above  Person educated: Patient and Spouse Education method: Medical illustrator Education comprehension: verbalized understanding and needs further education  HOME EXERCISE PROGRAM: To be established  GOALS: Goals reviewed with patient? Yes  SHORT TERM GOALS: Target date: 09/20/2023   Pt will perform floor transfer w/min A from wife for improved fall recovery and safety at home  Baseline: wife reports she can get pt up if he gets into quadruped, but has a hard time with this  Goal status: INITIAL  2.  Pt will improve 5 x STS to less than or equal to 20 seconds with proper body mechanics and BUE support to demonstrate improved functional strength and transfer efficiency.   Baseline: 17.34s w/BUE support and improper body mechanics bracing against the chair  Goal status: INITIAL  3.  TUG to be assessed and LTG updated  Baseline:  Goal status: INITIAL  4.  Pt will improve gait velocity to at least 1.85 ft/s w/U-Step for improved gait efficiency and reduced fall risk   Baseline: 1.64 ft/s w/U-Step  Goal status: INITIAL   LONG TERM GOALS: Target date: 10/04/2023   Pt will perform final HEP w/min A from wife for improved strength, balance, transfers and gait. Baseline:  Goal status: INITIAL  2.  Pt will improve gait velocity to at least 2.0 ft/s w/U-Step for improved gait efficiency and endurance  Baseline: 1.64 ft/s w/U-Step  Goal status:  INITIAL  3.  TUG goal  Baseline:  Goal status: INITIAL  4.  Pt will verbalize understanding of local PD community resources, including fitness post DC.  Baseline:  Goal status: INITIAL  ASSESSMENT:  CLINICAL IMPRESSION: Patient is a 73 year old male referred to Neuro OPPT for PD and dystonia. Pt's PMH is significant for: AICD present, CAD, deaf in R ear, DM II, GERD, MI, PD (1999), DBS.  The following deficits were present during the exam: decreased strength, impaired safety awareness, postural deficits, impaired balance, improper gait kinematics and decreased functional endurance. Based on gait speed and falls history, pt is a high fall risk. Pt would benefit from skilled PT to address these impairments and functional limitations to maximize functional mobility independence.    OBJECTIVE IMPAIRMENTS: Abnormal gait, decreased activity tolerance, decreased balance, decreased cognition, decreased coordination, decreased endurance, decreased knowledge of use of DME, decreased mobility, difficulty walking, decreased strength, decreased safety awareness, impaired perceived functional ability, increased muscle spasms, improper body mechanics, and postural dysfunction.   ACTIVITY LIMITATIONS: carrying, lifting, sitting, standing, transfers, bed mobility, continence, bathing, dressing, reach over head, hygiene/grooming, locomotion level, and caring for others  PARTICIPATION LIMITATIONS: meal prep, cleaning, laundry, medication management, personal finances, interpersonal relationship, driving, shopping, community activity, and yard work  PERSONAL FACTORS: Behavior pattern, Fitness, Past/current experiences, and 1-2 comorbidities: PD and dysotnia in LLE are also affecting patient's functional outcome.   REHAB POTENTIAL: Fair Due to severity of deficits   CLINICAL DECISION MAKING: Evolving/moderate complexity  EVALUATION COMPLEXITY: Moderate  PLAN:  PT FREQUENCY: 2x/week  PT DURATION: 6  weeks  PLANNED INTERVENTIONS: 97164- PT Re-evaluation, 97750- Physical Performance Testing, 97110-Therapeutic exercises, 97530- Therapeutic activity, [id number]- Neuromuscular re-education, 97535- Self Care, [id number]- Manual therapy, [id number]- Gait training, [id number]- Electrical stimulation (manual), [id number] (1-2 muscles), [id number] (3+ muscles)-  Dry Needling, Patient/Family education, Balance training, Stair training, Joint mobilization, Spinal mobilization, Vestibular training, DME instructions, and Moist heat  PLAN FOR NEXT SESSION: Assess TUG and update goal (pt does not fully turn). Work on endurance [employer], gait trials), upright posture, safety awareness. Seated HEP that he and his wife can do at home (seated PWR?)    Marlon BRAVO Christianjames Soule, PT, DPT 08/23/2023, 4:06 PM

## 2023-08-26 ENCOUNTER — Other Ambulatory Visit: Payer: Self-pay | Admitting: Neurology

## 2023-08-26 ENCOUNTER — Other Ambulatory Visit: Payer: Self-pay | Admitting: Endocrinology

## 2023-08-26 DIAGNOSIS — G20A2 Parkinson's disease without dyskinesia, with fluctuations: Secondary | ICD-10-CM

## 2023-08-26 DIAGNOSIS — Z794 Long term (current) use of insulin: Secondary | ICD-10-CM

## 2023-08-26 DIAGNOSIS — G20A1 Parkinson's disease without dyskinesia, without mention of fluctuations: Secondary | ICD-10-CM

## 2023-08-26 DIAGNOSIS — G253 Myoclonus: Secondary | ICD-10-CM

## 2023-08-27 NOTE — Telephone Encounter (Signed)
 Refill request complete

## 2023-08-28 ENCOUNTER — Ambulatory Visit: Admitting: Physical Therapy

## 2023-08-28 DIAGNOSIS — R293 Abnormal posture: Secondary | ICD-10-CM

## 2023-08-28 DIAGNOSIS — Z9181 History of falling: Secondary | ICD-10-CM | POA: Diagnosis not present

## 2023-08-28 DIAGNOSIS — M6281 Muscle weakness (generalized): Secondary | ICD-10-CM

## 2023-08-28 DIAGNOSIS — R2689 Other abnormalities of gait and mobility: Secondary | ICD-10-CM

## 2023-08-28 DIAGNOSIS — R278 Other lack of coordination: Secondary | ICD-10-CM | POA: Diagnosis not present

## 2023-08-28 DIAGNOSIS — G248 Other dystonia: Secondary | ICD-10-CM | POA: Diagnosis not present

## 2023-08-28 NOTE — Therapy (Signed)
 OUTPATIENT PHYSICAL THERAPY NEURO TREATMENT   Patient Name: Henry [name] MRN: [id number] DOB:[birth year], 73 y.o., male Today's Date: 08/28/2023   PCP: Cleatus Arlyss RAMAN, MD  REFERRING PROVIDER: Carilyn Prentice BRAVO, MD  END OF SESSION:  PT End of Session - 08/28/23 1539     Visit Number 2    Number of Visits 13    Date for PT Re-Evaluation 10/11/23    Authorization Type Medicare    PT Start Time 1522    PT Stop Time 1607    PT Time Calculation (min) 45 min    Equipment Utilized During Treatment Gait belt    Activity Tolerance Patient tolerated treatment well    Behavior During Therapy WFL for tasks assessed/performed           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)   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   Dysrhythmia    a-fib   GERD (gastroesophageal reflux disease)    Glaucoma    right eye   History of kidney stones    multiple   HLD (hyperlipidemia)    HTN (hypertension)    pt denies 08/19/12   Hyperplasia, prostate    Hypothyroidism    MI (myocardial infarction) (HCC)    Dr Pietro 2000, x3vessels bypass   Neuromuscular disorder (HCC)    Parkinson's Disease   OSA (obstructive sleep apnea)    AHI-28,on CPAP, noncompliant with CPAP   Parkinson disease (HCC)    1999   PONV (postoperative nausea and vomiting)    Restless legs    Shortness of breath    Hx: of at all times   UTI (lower urinary tract infection) 09/15/2012   Klebsiella   Ventral hernia    Walker as ambulation aid    also uses wheelchair at home/when going out   Past Surgical History:  Procedure Laterality Date   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   BIOPSY  10/14/2018   Procedure: BIOPSY;  Surgeon: Albertus Gordy HERO, MD;  Location: [facility];  Service: Gastroenterology;;   CATARACT EXTRACTION W/ INTRAOCULAR LENS IMPLANT     Hx: of right eye   CATARACT EXTRACTION W/ INTRAOCULAR LENS IMPLANT Left 2018   COLONOSCOPY N/A 10/13/2014   Procedure: COLONOSCOPY;  Surgeon: Gordy HERO Albertus, MD;  Location: [facility];  Service: Gastroenterology;  Laterality: N/A;   COLONOSCOPY W/ BIOPSIES AND POLYPECTOMY     Hx: of   COLONOSCOPY WITH PROPOFOL  N/A 10/14/2018   Procedure: COLONOSCOPY WITH PROPOFOL ;  Surgeon: Albertus Gordy HERO, MD;  Location: [facility];  Service: Gastroenterology;  Laterality: N/A;   CORONARY ARTERY BYPASS GRAFT  2000   Sudie Laine, MD   CORONARY STENT PLACEMENT  1998   DEEP BRAIN STIMULATOR PLACEMENT  2004   Right and left VIN stimulator placement (parkinsons)   EYE SURGERY     Cataract removed in 1980s related to wood in eye when 73 years old   FINGER AMPUTATION  left pointer   IMPLANTABLE CARDIOVERTER DEFIBRILLATOR IMPLANT N/A 11/13/2013   Procedure: IMPLANTABLE CARDIOVERTER DEFIBRILLATOR IMPLANT;  Surgeon: Danelle LELON Birmingham, MD;  Location: [facility] CATH LAB;  Service: Cardiovascular;  Laterality: N/A;   INSERT / REPLACE / REMOVE PACEMAKER     medtronic   LEFT AND RIGHT HEART CATHETERIZATION WITH CORONARY ANGIOGRAM N/A 09/24/2013   Procedure: LEFT AND RIGHT HEART CATHETERIZATION WITH CORONARY ANGIOGRAM;  Surgeon: Lonni JONETTA Cash, MD;  Location: [facility] CATH LAB;  Service: Cardiovascular;  Laterality: N/A;   LITHOTRIPSY     3 different times   POLYPECTOMY  10/14/2018   Procedure: POLYPECTOMY;  Surgeon: Albertus Gordy HERO, MD;  Location: [facility];  Service: Gastroenterology;;   PULSE GENERATOR IMPLANT Right 11/13/2017   Procedure: Right chest implantable pulse generator change;  Surgeon: Unice Pac, MD;  Location: [facility] OR;  Service: Neurosurgery;  Laterality: Right;  Right chest  implantable pulse generator change   SUBTHALAMIC STIMULATOR BATTERY REPLACEMENT N/A 09/05/2012   Procedure: Deep brain stimulator battery change;  Surgeon: Pac Unice, MD;  Location: [facility]URO [facility]S;  Service: Neurosurgery;  Laterality: N/A;  Deep brain stimulator battery change   SUBTHALAMIC STIMULATOR BATTERY REPLACEMENT N/A 06/10/2015   Procedure: Deep Brain stimulator battery change;  Surgeon: Pac Unice, MD;  Location: [facility];  Service: Neurosurgery;  Laterality: N/A;   SUBTHALAMIC STIMULATOR BATTERY REPLACEMENT Right 09/25/2019   Procedure: Deep brain stimulator battery change;  Surgeon: Unice Pac, MD;  Location: [facility] OR;  Service: Neurosurgery;  Laterality: Right;   SUBTHALAMIC STIMULATOR BATTERY REPLACEMENT Right 12/15/2022   Procedure: IMPLANTABLE PULSE GENERATOR  BATTERY REPLACEMENT;  Surgeon: Carollee Lani BROCKS, DO;  Location: [facility] OR;  Service: Neurosurgery;  Laterality: Right;  3C   TONSILLECTOMY     Patient Active Problem List   Diagnosis Date Noted   Skin tear of hand without complication 08/03/2023   Delirium 05/29/2023   Healthcare maintenance 04/08/2023   Cerumen impaction 04/08/2023   Hordeolum 03/21/2023   Pancreatic cyst 03/05/2022   Lower respiratory infection 02/20/2022   Lobar pneumonia (HCC) 02/20/2022   Sore throat 01/12/2022   Cyst of perianal area 01/12/2022   Strep pharyngitis 12/16/2021   Wheezing 12/16/2021   DOE (dyspnea on exertion) 12/16/2021   Parkinson's disease with EBS (electrical brain stimulation) [G20.A1] 12/05/2021   Fall at home 06/15/2021   Asthma    Skin lesion 05/30/2019   CAD (coronary artery disease) 08/25/2018   Diabetes mellitus without complication (HCC) 08/15/2018   Thrombocytopenia (HCC) 07/08/2017   Gout, unspecified 06/06/2017   Dysuria 05/25/2017   Microscopic hematuria 05/25/2017   CKD (chronic kidney disease) stage 3, GFR 30-59 ml/min (HCC) 04/12/2017   Neuropathy 02/25/2017   Urinary frequency 09/19/2016    Constipation 11/27/2015   Medicare annual wellness visit, initial 09/23/2015   Advance care planning 09/23/2015   Spinal stenosis of lumbar region 01/26/2015   History of colonic polyps    Restless leg syndrome 08/11/2014   ICD- MDT, implanted 11/13/13 11/14/2013   Chronic systolic CHF (congestive heart failure) (HCC) 11/13/2013   SOB (shortness of breath), mostly on exertion 06/05/2013   Acute on chronic combined systolic and diastolic congestive heart failure, NYHA class 4 (HCC) 05/12/2013   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   Umbilical hernia 01/27/2013   Elevated PSA 12/04/2012   Other malaise and fatigue 12/04/2012   PD (Parkinson's disease) (HCC) 08/19/2012   OSA on CPAP 04/17/2012   Dysautonomia (HCC) 04/17/2012   Obesity, morbid (HCC) 04/17/2012   Hypothyroidism following radioiodine  therapy 10/16/2011   GERD 05/11/2009   Type 2 diabetes mellitus with chronic kidney disease, with long-term current use of insulin  (HCC) 04/26/2009   Back pain 08/18/2008   OTHER SPEC FORMS CHRONIC ISCHEMIC HEART DISEASE 04/24/2008   HYPERPLASIA PROSTATE UNS W/O UR OBST & OTH LUTS 09/02/2007   NEPHROLITHIASIS, HX OF 09/02/2007   Ventral hernia 12/28/2006   Hyperlipidemia 08/31/2006   HTN (hypertension) 08/31/2006   Hx of CABG x 3 in 2000 08/31/2006    ONSET DATE: 08/09/2023 (referral)   REFERRING DIAG: G24.8 (ICD-10-CM) - 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 G20.B2 (ICD-10-CM) - Parkinson's disease with dyskinesia and fluctuating manifestations (HCC)  THERAPY DIAG:  Abnormal posture  History of falling  Muscle weakness (generalized)  Other abnormalities of gait and mobility  Other lack of coordination  Rationale for Evaluation and Treatment: Rehabilitation  SUBJECTIVE:                                                                                                                                                                                             SUBJECTIVE  STATEMENT: Henry [name]   Pt presents w/U-Step walker, severely forward flexed posture. Reports he is slow today. Denies falls or acute changes.   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 No  PRECAUTIONS: Fall, ICD/Pacemaker, and Other: DBS  RED FLAGS: None   WEIGHT BEARING RESTRICTIONS: No  FALLS: Has patient fallen in last 6 months? Yes. Number of falls 2  LIVING ENVIRONMENT: Lives with: lives with their spouse and Oldest Daughter and her husband Lives in: House/apartment Stairs: Ramped entry  Has following equipment at home: Environmental consultant - 2 wheeled, Environmental consultant - 4 wheeled, Wheelchair (manual), [employer], Grab bars, and U-Step walker   PLOF: Requires assistive device for independence, Needs assistance with ADLs, Needs assistance with gait, and Needs assistance with transfers  PATIENT GOALS: I don't know I just want to keep my strength   OBJECTIVE:  Note: Objective measures were completed at Evaluation unless otherwise noted.  DIAGNOSTIC FINDINGS: None relevant   COGNITION: Overall cognitive status: History of cognitive impairments - at baseline   SENSATION: Pt denied numbness/tingling in BLEs  COORDINATION: Dystonia in LLE    POSTURE: rounded shoulders, forward head, increased thoracic kyphosis, posterior pelvic tilt, and flexed trunk   LOWER EXTREMITY ROM:     Active  Right Eval Left Eval  Hip flexion    Hip extension  Hip abduction    Hip adduction    Hip internal rotation    Hip external rotation    Knee flexion    Knee extension    Ankle dorsiflexion    Ankle plantarflexion    Ankle inversion    Ankle eversion     (Blank rows = not tested)  LOWER EXTREMITY MMT:    MMT Right Eval Left Eval  Hip flexion    Hip extension    Hip abduction    Hip adduction    Hip internal rotation    Hip  external rotation    Knee flexion    Knee extension    Ankle dorsiflexion    Ankle plantarflexion    Ankle inversion    Ankle eversion    (Blank rows = not tested)  BED MOBILITY:  Not tested Pt frequently sleeps in recliner (lift chair)   TRANSFERS: Sit to stand: CGA  Assistive device utilized: U-Step     Stand to sit: CGA and Min A  Assistive device utilized: U-step      Pt does not fully turn to sit and pushes U-Step away from body, requiring min A to ensure pt did not miss seat or fall forwards when pushing U-Step away. Pt reports he did this because he was fatigued. Pt also demonstrates poor anterior weight shift w/heavy posterior bracing on chair to stand.   RAMP:  Not tested  CURB:  Not tested  STAIRS: Not tested GAIT: Gait pattern: step to pattern, decreased stride length, decreased hip/knee flexion- Right, decreased hip/knee flexion- Left, decreased ankle dorsiflexion- Right, decreased ankle dorsiflexion- Left, shuffling, trunk flexed, wide BOS, poor foot clearance- Right, and poor foot clearance- Left Distance walked: Various short clinic distances  Assistive device utilized: U-Step  Level of assistance: CGA Comments: Pt assumes severe forward-flexed posture w/arms extended on walker. Max verbal and tactile cues provided to facilitate upright posture, which pt able to maintain for brief amount of time prior to resuming forward flexed posture.     FUNCTIONAL TESTS:                                                                                                                                 TREATMENT :   Ther Act  SciFit multi-peaks level 3.5 for 8 minutes using BUE/BLEs for neural priming for reciprocal movement, dynamic cardiovascular warmup and increased amplitude of stepping. Pt required min-mod A to fully turn to sit as pt performs 90 degree turn and reaches for seat rather than 180 degree turn. RPE of 5/10 following activity.  Majority of session spent  educating pt on proper bed mobility technique for reduced fall risk and improved body mechanics. Pt currently crawling into bed on all 4s and then falling over to his side, as he is a side sleeper. Pt then kicks his legs to generate momentum to sit up in bed. Provided thorough education including visual demonstration on how  to perform log roll technique to get into and out of bed, which pt practiced 5x w/good demonstration at SBA level. Pt reports this technique is easier to perform than his current way, but cannot perform this at home due to a step stool and his lift chair being in the way. Wife reports she will move these. Pt able to teach back proper bed mobility technique at end of session to demonstrate recall and retention.  Attempted to have pt lie supine to work on HEP, but pt unable to tolerate lying on his back as he has difficulty breathing even w/HOB elevated, so discontinued activity.  Practiced proper turning to sit technique w/pt throughout session, but pt continues to require min-mod A to properly turn to sit as he does not turn fully and reaches for seated surface and falls into it. Pt is able to fully turn if provided w/max cues from therapist, but he is not receptive to caregiver. Will continue to work on this in PT      PATIENT EDUCATION: Education details: Proper bed mobility technique, upright posture w/U-step  Person educated: Patient and Spouse Education method: Explanation, Demonstration, Actor cues, and Verbal cues Education comprehension: verbalized understanding, returned demonstration, verbal cues required, tactile cues required, and needs further education  HOME EXERCISE PROGRAM: To be established  GOALS: Goals reviewed with patient? Yes  SHORT TERM GOALS: Target date: 09/20/2023   Pt will perform floor transfer w/min A from wife for improved fall recovery and safety at home  Baseline: wife reports she can get pt up if he gets into quadruped, but has a hard time  with this  Goal status: INITIAL  2.  Pt will improve 5 x STS to less than or equal to 20 seconds with proper body mechanics and BUE support to demonstrate improved functional strength and transfer efficiency.   Baseline: 17.34s w/BUE support and improper body mechanics bracing against the chair  Goal status: INITIAL  3.  TUG to be assessed and LTG updated  Baseline:  Goal status: INITIAL  4.  Pt will improve gait velocity to at least 1.85 ft/s w/U-Step for improved gait efficiency and reduced fall risk   Baseline: 1.64 ft/s w/U-Step  Goal status: INITIAL   LONG TERM GOALS: Target date: 10/04/2023   Pt will perform final HEP w/min A from wife for improved strength, balance, transfers and gait. Baseline:  Goal status: INITIAL  2.  Pt will improve gait velocity to at least 2.0 ft/s w/U-Step for improved gait efficiency and endurance  Baseline: 1.64 ft/s w/U-Step  Goal status: INITIAL  3.  TUG goal  Baseline:  Goal status: INITIAL  4.  Pt will verbalize understanding of local PD community resources, including fitness post DC.  Baseline:  Goal status: INITIAL  ASSESSMENT:  CLINICAL IMPRESSION: Emphasis of skilled PT session on reciprocal coordination, upright posture w/gait and safety w/transfers at home. Majority of session spent reviewing proper bed mobility technique as pt currently falling into bed which is not safe. Pt able to demonstrate and teach back log roll technique x5 at SBA level and wife reports they will practice this at home. Pt demonstrated improved posture w/U-step this date but continues to require max cues to maintain close distance to AD and upright posture. Pt also requires min-mod A for safety w/turns, as he partially turns prior to sitting and relies on reaching for seated surface to pull himself down, placing him at high fall risk. Continue POC.    OBJECTIVE IMPAIRMENTS: Abnormal  gait, decreased activity tolerance, decreased balance, decreased cognition,  decreased coordination, decreased endurance, decreased knowledge of use of DME, decreased mobility, difficulty walking, decreased strength, decreased safety awareness, impaired perceived functional ability, increased muscle spasms, improper body mechanics, and postural dysfunction.   ACTIVITY LIMITATIONS: carrying, lifting, sitting, standing, transfers, bed mobility, continence, bathing, dressing, reach over head, hygiene/grooming, locomotion level, and caring for others  PARTICIPATION LIMITATIONS: meal prep, cleaning, laundry, medication management, personal finances, interpersonal relationship, driving, shopping, community activity, and yard work  PERSONAL FACTORS: Behavior pattern, Fitness, Past/current experiences, and 1-2 comorbidities: PD and dysotnia in LLE are also affecting patient's functional outcome.   REHAB POTENTIAL: Fair Due to severity of deficits   CLINICAL DECISION MAKING: Evolving/moderate complexity  EVALUATION COMPLEXITY: Moderate  PLAN:  PT FREQUENCY: 2x/week  PT DURATION: 6 weeks  PLANNED INTERVENTIONS: 97164- PT Re-evaluation, 97750- Physical Performance Testing, 97110-Therapeutic exercises, 97530- Therapeutic activity, [id number]- Neuromuscular re-education, 97535- Self Care, [id number]- Manual therapy, [id number]- Gait training, [phone]- Electrical stimulation (manual), [phone] (1-2 muscles), [phone] (3+ muscles)- Dry Needling, Patient/Family education, Balance training, Stair training, Joint mobilization, Spinal mobilization, Vestibular training, DME instructions, and Moist heat  PLAN FOR NEXT SESSION: Assess TUG and update goal (pt does not fully turn). Work on endurance [employer], gait trials), upright posture, safety awareness. Seated HEP that he and his wife can do at home (seated PWR?). TURNS. How is bed mobility?    Red Mandt E Rashaan Wyles, PT, DPT 08/28/2023, 4:07 PM

## 2023-08-31 ENCOUNTER — Ambulatory Visit: Admitting: Physical Therapy

## 2023-08-31 DIAGNOSIS — R293 Abnormal posture: Secondary | ICD-10-CM

## 2023-08-31 DIAGNOSIS — M6281 Muscle weakness (generalized): Secondary | ICD-10-CM | POA: Diagnosis not present

## 2023-08-31 DIAGNOSIS — Z9181 History of falling: Secondary | ICD-10-CM | POA: Diagnosis not present

## 2023-08-31 DIAGNOSIS — R2689 Other abnormalities of gait and mobility: Secondary | ICD-10-CM | POA: Diagnosis not present

## 2023-08-31 DIAGNOSIS — R278 Other lack of coordination: Secondary | ICD-10-CM

## 2023-08-31 DIAGNOSIS — G248 Other dystonia: Secondary | ICD-10-CM | POA: Diagnosis not present

## 2023-08-31 NOTE — Therapy (Signed)
 OUTPATIENT PHYSICAL THERAPY NEURO TREATMENT   Patient Name: Henry [name] MRN: [id number] DOB:1950-03-08, 73 y.o., male Today's Date: 08/31/2023   PCP: Cleatus Arlyss RAMAN, MD  REFERRING PROVIDER: Carilyn Prentice BRAVO, MD  END OF SESSION:  PT End of Session - 08/31/23 1354     Visit Number 3    Number of Visits 13    Date for PT Re-Evaluation 10/11/23    Authorization Type Medicare    PT Start Time 1352    PT Stop Time 1438    PT Time Calculation (min) 46 min    Equipment Utilized During Treatment Gait belt    Activity Tolerance Patient tolerated treatment well    Behavior During Therapy WFL for tasks assessed/performed            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)   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   Dysrhythmia    a-fib   GERD (gastroesophageal reflux disease)    Glaucoma    right eye   History of kidney stones    multiple   HLD (hyperlipidemia)    HTN (hypertension)    pt denies 08/19/12   Hyperplasia, prostate    Hypothyroidism    MI (myocardial infarction) (HCC)    Dr Pietro 2000, x3vessels bypass   Neuromuscular disorder (HCC)    Parkinson's Disease   OSA (obstructive sleep apnea)    AHI-28,on CPAP, noncompliant with CPAP   Parkinson disease (HCC)    1999   PONV (postoperative nausea and vomiting)    Restless legs    Shortness of breath    Hx: of at all times   UTI (lower urinary tract infection) 09/15/2012   Klebsiella   Ventral hernia    Walker as ambulation aid    also uses wheelchair at home/when going out   Past Surgical History:  Procedure Laterality Date    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   BIOPSY  10/14/2018   Procedure: BIOPSY;  Surgeon: Albertus Gordy HERO, MD;  Location: [facility];  Service: Gastroenterology;;   CATARACT EXTRACTION W/ INTRAOCULAR LENS IMPLANT     Hx: of right eye   CATARACT EXTRACTION W/ INTRAOCULAR LENS IMPLANT Left 2018   COLONOSCOPY N/A 10/13/2014   Procedure: COLONOSCOPY;  Surgeon: Gordy HERO Albertus, MD;  Location: [facility];  Service: Gastroenterology;  Laterality: N/A;   COLONOSCOPY W/ BIOPSIES AND POLYPECTOMY     Hx: of   COLONOSCOPY WITH PROPOFOL  N/A 10/14/2018   Procedure: COLONOSCOPY WITH PROPOFOL ;  Surgeon: Albertus Gordy HERO, MD;  Location: [facility];  Service: Gastroenterology;  Laterality: N/A;   CORONARY ARTERY BYPASS GRAFT  2000   Sudie Laine, MD   CORONARY STENT PLACEMENT  1998   DEEP BRAIN STIMULATOR PLACEMENT  2004   Right and left VIN stimulator placement (parkinsons)   EYE SURGERY     Cataract removed in 1980s related to wood in eye when 73 years old   FINGER AMPUTATION  left pointer   IMPLANTABLE CARDIOVERTER DEFIBRILLATOR IMPLANT N/A 11/13/2013   Procedure: IMPLANTABLE CARDIOVERTER DEFIBRILLATOR IMPLANT;  Surgeon: Danelle LELON Birmingham, MD;  Location: [facility] CATH LAB;  Service: Cardiovascular;  Laterality: N/A;   INSERT / REPLACE / REMOVE PACEMAKER     medtronic   LEFT AND RIGHT HEART CATHETERIZATION WITH CORONARY ANGIOGRAM N/A 09/24/2013   Procedure: LEFT AND RIGHT HEART CATHETERIZATION WITH CORONARY ANGIOGRAM;  Surgeon: Lonni JONETTA Cash, MD;  Location: [facility] CATH LAB;  Service: Cardiovascular;  Laterality: N/A;   LITHOTRIPSY     3 different times   POLYPECTOMY  10/14/2018   Procedure: POLYPECTOMY;  Surgeon: Albertus Gordy HERO, MD;  Location: [facility];  Service: Gastroenterology;;   PULSE GENERATOR IMPLANT Right 11/13/2017   Procedure: Right chest implantable pulse generator change;  Surgeon: Unice Pac, MD;  Location: [facility] OR;  Service: Neurosurgery;  Laterality: Right;  Right  chest implantable pulse generator change   SUBTHALAMIC STIMULATOR BATTERY REPLACEMENT N/A 09/05/2012   Procedure: Deep brain stimulator battery change;  Surgeon: Pac Unice, MD;  Location: [facility];  Service: Neurosurgery;  Laterality: N/A;  Deep brain stimulator battery change   SUBTHALAMIC STIMULATOR BATTERY REPLACEMENT N/A 06/10/2015   Procedure: Deep Brain stimulator battery change;  Surgeon: Pac Unice, MD;  Location: [facility];  Service: Neurosurgery;  Laterality: N/A;   SUBTHALAMIC STIMULATOR BATTERY REPLACEMENT Right 09/25/2019   Procedure: Deep brain stimulator battery change;  Surgeon: Unice Pac, MD;  Location: [facility] OR;  Service: Neurosurgery;  Laterality: Right;   SUBTHALAMIC STIMULATOR BATTERY REPLACEMENT Right 12/15/2022   Procedure: IMPLANTABLE PULSE GENERATOR  BATTERY REPLACEMENT;  Surgeon: Carollee Lani BROCKS, DO;  Location: [facility] OR;  Service: Neurosurgery;  Laterality: Right;  3C   TONSILLECTOMY     Patient Active Problem List   Diagnosis Date Noted   Skin tear of hand without complication 08/03/2023   Delirium 05/29/2023   Healthcare maintenance 04/08/2023   Cerumen impaction 04/08/2023   Hordeolum 03/21/2023   Pancreatic cyst 03/05/2022   Lower respiratory infection 02/20/2022   Lobar pneumonia (HCC) 02/20/2022   Sore throat 01/12/2022   Cyst of perianal area 01/12/2022   Strep pharyngitis 12/16/2021   Wheezing 12/16/2021   DOE (dyspnea on exertion) 12/16/2021   Parkinson's disease with EBS (electrical brain stimulation) [G20.A1] 12/05/2021   Fall at home 06/15/2021   Asthma    Skin lesion 05/30/2019   CAD (coronary artery disease) 08/25/2018   Diabetes mellitus without complication (HCC) 08/15/2018   Thrombocytopenia (HCC) 07/08/2017   Gout, unspecified 06/06/2017   Dysuria 05/25/2017   Microscopic hematuria 05/25/2017   CKD (chronic kidney disease) stage 3, GFR 30-59 ml/min (HCC) 04/12/2017   Neuropathy 02/25/2017   Urinary frequency 09/19/2016    Constipation 11/27/2015   Medicare annual wellness visit, initial 09/23/2015   Advance care planning 09/23/2015   Spinal stenosis of lumbar region 01/26/2015   History of colonic polyps    Restless leg syndrome 08/11/2014   ICD- MDT, implanted 11/13/13 11/14/2013   Chronic systolic CHF (congestive heart failure) (HCC) 11/13/2013   SOB (shortness of breath), mostly on exertion 06/05/2013   Acute on chronic combined systolic and diastolic congestive heart failure, NYHA class 4 (HCC) 05/12/2013   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   Umbilical hernia 01/27/2013   Elevated PSA 12/04/2012   Other malaise and fatigue 12/04/2012   PD (Parkinson's disease) (HCC) 08/19/2012   OSA on CPAP 04/17/2012   Dysautonomia (HCC) 04/17/2012   Obesity, morbid (HCC) 04/17/2012   Hypothyroidism following radioiodine  therapy 10/16/2011   GERD 05/11/2009   Type 2 diabetes mellitus with chronic kidney disease, with long-term current use of insulin  (HCC) 04/26/2009   Back pain 08/18/2008   OTHER SPEC FORMS CHRONIC ISCHEMIC HEART DISEASE 04/24/2008   HYPERPLASIA PROSTATE UNS W/O UR OBST & OTH LUTS 09/02/2007   NEPHROLITHIASIS, HX OF 09/02/2007   Ventral hernia 12/28/2006   Hyperlipidemia 08/31/2006   HTN (hypertension) 08/31/2006   Hx of CABG x 3 in 2000 08/31/2006    ONSET DATE: 08/09/2023 (referral)   REFERRING DIAG: G24.8 (ICD-10-CM) - 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 States he is doing well. Has practiced log roll technique at home and it is going well. No pain or falls to report   *pt is deaf in R ear and has dysphagia    Pt accompanied by: Wife, [name]   PERTINENT HISTORY: AICD present, CAD, deaf in R ear, DM II, GERD, MI, PD (1999), DBS   PAIN:  Are you having pain? No  PRECAUTIONS: Fall, ICD/Pacemaker, and Other: DBS  RED FLAGS: None   WEIGHT BEARING RESTRICTIONS: No  FALLS: Has patient fallen in last 6 months? Yes. Number of falls 2  LIVING ENVIRONMENT: Lives with: lives with their spouse and Oldest Daughter and her husband Lives in: House/apartment Stairs: Ramped entry  Has following equipment at home: Environmental consultant - 2 wheeled, Environmental consultant - 4 wheeled, Wheelchair (manual), [employer], Grab bars, and U-Step walker   PLOF: Requires assistive device for independence, Needs assistance with ADLs, Needs assistance with gait, and Needs assistance with transfers  PATIENT GOALS: I don't know I just want to keep my strength   OBJECTIVE:  Note: Objective measures were completed at Evaluation unless otherwise noted.  DIAGNOSTIC FINDINGS: None relevant   COGNITION: Overall cognitive status: History of cognitive impairments - at baseline   SENSATION: Pt denied numbness/tingling in BLEs  COORDINATION: Dystonia in LLE    POSTURE: rounded shoulders, forward head, increased thoracic kyphosis, posterior pelvic tilt, and flexed trunk   LOWER EXTREMITY ROM:     Active  Right Eval Left Eval  Hip flexion    Hip extension    Hip abduction    Hip adduction    Hip internal rotation    Hip external rotation    Knee flexion    Knee extension    Ankle dorsiflexion    Ankle plantarflexion    Ankle inversion    Ankle eversion     (Blank rows = not tested)  LOWER EXTREMITY MMT:    MMT Right Eval Left Eval  Hip flexion    Hip extension    Hip abduction     Hip adduction    Hip internal rotation    Hip external rotation    Knee flexion    Knee extension    Ankle dorsiflexion    Ankle plantarflexion    Ankle inversion    Ankle eversion    (Blank rows = not tested)  BED MOBILITY:  Not tested Pt frequently sleeps in recliner (lift chair)   TRANSFERS: Sit to stand: CGA  Assistive device utilized: U-Step     Stand to sit: CGA and Min A  Assistive device utilized: U-step      Pt does not fully turn to sit and pushes U-Step away from body, requiring min A to ensure pt did not miss seat or fall forwards when pushing U-Step away. Pt reports he did this because he was fatigued. Pt also demonstrates poor anterior weight shift w/heavy posterior bracing on chair to stand.   RAMP:  Not tested  CURB:  Not tested  STAIRS: Not tested GAIT: Gait pattern: step to pattern, decreased stride length, decreased hip/knee flexion- Right, decreased hip/knee flexion- Left, decreased ankle dorsiflexion- Right, decreased ankle dorsiflexion- Left, shuffling, trunk flexed, wide BOS, poor foot clearance- Right, and poor foot clearance- Left Distance walked: Various short clinic distances  Assistive device utilized: U-Step  Level of assistance: CGA Comments: Pt assumes severe forward-flexed posture w/arms extended on walker. Max verbal and tactile cues provided to facilitate upright posture, which pt able to maintain for brief amount of time prior to resuming forward flexed posture.     FUNCTIONAL TESTS:   [facility] PT Assessment - 08/31/23 1412       Balance   Balance Assessed Yes      Standardized Balance Assessment   Standardized Balance Assessment Timed Up and Go Test      Timed Up and Go Test   Normal TUG (seconds) 20.03   U-Step, CGA, wide turns                                                                                                                                      TREATMENT :   Ther Act  Pt required mod A to fully turn to sit on  mat table, as pt turns halfway, pushed U-step away from him and reaches for mat. Continue to educate pt on importance of maintaining close  distance to U-step and FULLY turning prior to sitting. Pt demonstrated full turn to sit for remainder of session and educated pt on how important it is that he does this at home for reduced fall risk. Pt verbalized understanding.  SciFit multi-peaks level 6.5 for 8 minutes using BUE/BLEs for neural priming for reciprocal movement, dynamic cardiovascular warmup and increased amplitude of stepping. With cues, pt able to fully turn to sit on seat today. RPE of 5/10 following activity.    [facility] PT Assessment - 08/31/23 1412       Balance   Balance Assessed Yes      Standardized Balance Assessment   Standardized Balance Assessment Timed Up and Go Test      Timed Up and Go Test   Normal TUG (seconds) 20.03   U-Step, CGA, wide turns        NMR At ballet bar, 5 blaze pods on mirror for improved postural control, standing tolerance, visual scanning and UE coordination.  Performed on 1.5 minute intervals with 3-4 minute rest periods.  Pt requires CGA guarding and UE support. Round 1:  5 pods placed on mirror from 9-3 o'clock setup on random reach setup.  33 hits. Round 2:  Distracting colors setting w/same pod setup, used 4 distracting colors.  23 hits w/6 errors (all purple). Round 3:  On random reach setting, placed pods in straight horizontal line slightly above pt's height setup to work on side stepping.  37 hits. Notable errors/deficits:  Pt unable to differentiate between dark blue and purple pods. Min cues to maintain UE support on rail throughout. Pt unable to tolerate 2 minute rounds due to BLE weakness. RPE of 6-7/10     PATIENT EDUCATION: Education details: Continue working on log roll technique and fully turning to sit at home  Person educated: Patient and Spouse Education method: Explanation, Demonstration, Actor cues, and Verbal cues Education  comprehension: verbalized understanding, returned demonstration, verbal cues required, tactile cues required, and needs further education  HOME EXERCISE PROGRAM: To be established  GOALS: Goals reviewed with patient? Yes  SHORT TERM GOALS: Target date: 09/20/2023   Pt will perform floor transfer w/min A from wife for improved fall recovery and safety at home  Baseline: wife reports she can get pt up if he gets into quadruped, but has a hard time with this  Goal status: INITIAL  2.  Pt will improve 5 x STS to less than or equal to 20 seconds with proper body mechanics and BUE support to demonstrate improved functional strength and transfer efficiency.   Baseline: 17.34s w/BUE support and improper body mechanics bracing against the chair  Goal status: INITIAL  3.  TUG to be assessed and LTG updated  Baseline:  Goal status: MET  4.  Pt will improve gait velocity to at least 1.85 ft/s w/U-Step for improved gait efficiency and reduced fall risk   Baseline: 1.64 ft/s w/U-Step  Goal status: INITIAL   LONG TERM GOALS: Target date: 10/04/2023   Pt will perform final HEP w/min A from wife for improved strength, balance, transfers and gait. Baseline:  Goal status: INITIAL  2.  Pt will improve gait velocity to at least 2.0 ft/s w/U-Step for improved gait efficiency and endurance  Baseline: 1.64 ft/s w/U-Step  Goal status: INITIAL  3.  Pt will improve normal TUG to less than or equal to 17 seconds for improved functional mobility and decreased fall risk.  Baseline: 20.03s w/U-step and CGA Goal status: REVISED  4.  Pt will verbalize understanding of local PD community resources, including fitness post DC.  Baseline:  Goal status: INITIAL  ASSESSMENT:  CLINICAL IMPRESSION: Emphasis of skilled PT session on assessing turns and balance via TUG, postural control and endurance. Pt initially required mod A to fully turn to sit on mat table, but demonstrated good retention of education  throughout remainder of session, requiring CGA only for turns. Pt performed normal TUG in >20s, indicative of increased fall risk. Pt able to maintain upright posture throughout blaze pods activity but has dyspnea w/exertion throughout. Continue POC.    OBJECTIVE IMPAIRMENTS: Abnormal gait, decreased activity tolerance, decreased balance, decreased cognition, decreased coordination, decreased endurance, decreased knowledge of use of DME, decreased mobility, difficulty walking, decreased strength, decreased safety awareness, impaired perceived functional ability, increased muscle spasms, improper body mechanics, and postural dysfunction.   ACTIVITY LIMITATIONS: carrying, lifting, sitting, standing, transfers, bed mobility, continence, bathing, dressing, reach over head, hygiene/grooming, locomotion level, and caring for others  PARTICIPATION LIMITATIONS: meal prep, cleaning, laundry, medication management, personal finances, interpersonal relationship, driving, shopping, community activity, and yard work  PERSONAL FACTORS: Behavior pattern, Fitness, Past/current experiences, and 1-2 comorbidities: PD and dysotnia in LLE are also affecting patient's functional outcome.   REHAB POTENTIAL: Fair Due to severity of deficits   CLINICAL DECISION MAKING: Evolving/moderate complexity  EVALUATION COMPLEXITY: Moderate  PLAN:  PT FREQUENCY: 2x/week  PT DURATION: 6 weeks  PLANNED INTERVENTIONS: 97164- PT Re-evaluation, 97750- Physical Performance Testing, 97110-Therapeutic exercises, 97530- Therapeutic activity, [id number]- Neuromuscular re-education, 97535- Self Care, [id number]- Manual therapy, [id number]- Gait training, [phone]- Electrical stimulation (manual), [phone] (1-2 muscles), [id number] (3+ muscles)- Dry Needling, Patient/Family education, Balance training, Stair training, Joint mobilization, Spinal mobilization, Vestibular training, DME instructions, and Moist heat  PLAN FOR NEXT SESSION: Work on endurance (Scifit,  gait trials), upright posture, safety awareness. Seated HEP that he and his wife can do at home (seated PWR?). TURNS. How is bed mobility? Turning at home?    Wilho Sharpley E Kaytelynn Scripter, PT, DPT 08/31/2023, 2:42 PM

## 2023-09-03 ENCOUNTER — Other Ambulatory Visit: Payer: Self-pay | Admitting: Endocrinology

## 2023-09-04 DIAGNOSIS — H40032 Anatomical narrow angle, left eye: Secondary | ICD-10-CM | POA: Diagnosis not present

## 2023-09-04 DIAGNOSIS — H4031X3 Glaucoma secondary to eye trauma, right eye, severe stage: Secondary | ICD-10-CM | POA: Diagnosis not present

## 2023-09-04 DIAGNOSIS — E119 Type 2 diabetes mellitus without complications: Secondary | ICD-10-CM | POA: Diagnosis not present

## 2023-09-04 DIAGNOSIS — Z794 Long term (current) use of insulin: Secondary | ICD-10-CM | POA: Diagnosis not present

## 2023-09-04 DIAGNOSIS — H04123 Dry eye syndrome of bilateral lacrimal glands: Secondary | ICD-10-CM | POA: Diagnosis not present

## 2023-09-04 LAB — HM DIABETES EYE EXAM

## 2023-09-05 ENCOUNTER — Ambulatory Visit: Admitting: Physical Therapy

## 2023-09-05 DIAGNOSIS — R293 Abnormal posture: Secondary | ICD-10-CM

## 2023-09-05 DIAGNOSIS — R2689 Other abnormalities of gait and mobility: Secondary | ICD-10-CM

## 2023-09-05 DIAGNOSIS — Z9181 History of falling: Secondary | ICD-10-CM

## 2023-09-05 DIAGNOSIS — G248 Other dystonia: Secondary | ICD-10-CM | POA: Diagnosis not present

## 2023-09-05 DIAGNOSIS — M6281 Muscle weakness (generalized): Secondary | ICD-10-CM

## 2023-09-05 DIAGNOSIS — R278 Other lack of coordination: Secondary | ICD-10-CM | POA: Diagnosis not present

## 2023-09-05 NOTE — Therapy (Signed)
 OUTPATIENT PHYSICAL THERAPY NEURO TREATMENT   Patient Name: Henry [name] MRN: [id number] DOB:Oct 31, 1950, 73 y.o., male Today's Date: 09/05/2023   PCP: Cleatus Arlyss RAMAN, MD  REFERRING PROVIDER: Carilyn Prentice BRAVO, MD  END OF SESSION:  PT End of Session - 09/05/23 1535     Visit Number 4    Number of Visits 13    Date for PT Re-Evaluation 10/11/23    Authorization Type Medicare    PT Start Time 1532    PT Stop Time 1611   Pt needing to use restroom   PT Time Calculation (min) 39 min    Equipment Utilized During Treatment Gait belt    Activity Tolerance Patient tolerated treatment well    Behavior During Therapy WFL for tasks assessed/performed            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)   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   Dysrhythmia    a-fib   GERD (gastroesophageal reflux disease)    Glaucoma    right eye   History of kidney stones    multiple   HLD (hyperlipidemia)    HTN (hypertension)    pt denies 08/19/12   Hyperplasia, prostate    Hypothyroidism    MI (myocardial infarction) (HCC)    Dr Pietro 2000, x3vessels bypass   Neuromuscular disorder (HCC)    Parkinson's Disease   OSA (obstructive sleep apnea)    AHI-28,on CPAP, noncompliant with CPAP   Parkinson disease (HCC)    1999   PONV (postoperative nausea and vomiting)    Restless legs    Shortness of breath    Hx: of at all times   UTI (lower urinary tract infection) 09/15/2012   Klebsiella   Ventral hernia    Walker as ambulation aid    also uses wheelchair at home/when going out   Past Surgical History:   Procedure Laterality Date   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   BIOPSY  10/14/2018   Procedure: BIOPSY;  Surgeon: Albertus Gordy HERO, MD;  Location: [facility];  Service: Gastroenterology;;   CATARACT EXTRACTION W/ INTRAOCULAR LENS IMPLANT     Hx: of right eye   CATARACT EXTRACTION W/ INTRAOCULAR LENS IMPLANT Left 2018   COLONOSCOPY N/A 10/13/2014   Procedure: COLONOSCOPY;  Surgeon: Gordy HERO Albertus, MD;  Location: [facility];  Service: Gastroenterology;  Laterality: N/A;   COLONOSCOPY W/ BIOPSIES AND POLYPECTOMY     Hx: of   COLONOSCOPY WITH PROPOFOL  N/A 10/14/2018   Procedure: COLONOSCOPY WITH PROPOFOL ;  Surgeon: Albertus Gordy HERO, MD;  Location: [facility];  Service: Gastroenterology;  Laterality: N/A;   CORONARY ARTERY BYPASS GRAFT  2000   Sudie Laine, MD   CORONARY STENT PLACEMENT  1998   DEEP BRAIN STIMULATOR PLACEMENT  2004   Right and left VIN stimulator placement (parkinsons)   EYE SURGERY     Cataract removed in 1980s related to wood in eye when 73 years old  FINGER AMPUTATION     left pointer   IMPLANTABLE CARDIOVERTER DEFIBRILLATOR IMPLANT N/A 11/13/2013   Procedure: IMPLANTABLE CARDIOVERTER DEFIBRILLATOR IMPLANT;  Surgeon: Danelle LELON Birmingham, MD;  Location: [facility] CATH LAB;  Service: Cardiovascular;  Laterality: N/A;   INSERT / REPLACE / REMOVE PACEMAKER     medtronic   LEFT AND RIGHT HEART CATHETERIZATION WITH CORONARY ANGIOGRAM N/A 09/24/2013   Procedure: LEFT AND RIGHT HEART CATHETERIZATION WITH CORONARY ANGIOGRAM;  Surgeon: Lonni JONETTA Cash, MD;  Location: [facility] C[facility] LAB;  Service: Cardiovascular;  Laterality: N/A;   LITHOTRIPSY     3 different times   POLYPECTOMY  10/14/2018   Procedure: POLYPECTOMY;  Surgeon: Albertus Gordy HERO, MD;  Location: [facility];  Service: Gastroenterology;;   PULSE GENERATOR IMPLANT Right 11/13/2017   Procedure: Right chest implantable pulse generator change;  Surgeon: Unice Pac, MD;  Location: [facility] OR;  Service: Neurosurgery;   Laterality: Right;  Right chest implantable pulse generator change   SUBTHALAMIC STIMULATOR BATTERY REPLACEMENT N/A 09/05/2012   Procedure: Deep brain stimulator battery change;  Surgeon: Pac Unice, MD;  Location: [facility] NEURO ORS;  Service: Neurosurgery;  Laterality: N/A;  Deep brain stimulator battery change   SUBTHALAMIC STIMULATOR BATTERY REPLACEMENT N/A 06/10/2015   Procedure: Deep Brain stimulator battery change;  Surgeon: Pac Unice, MD;  Location: [facility]URO [facility];  Service: Neurosurgery;  Laterality: N/A;   SUBTHALAMIC STIMULATOR BATTERY REPLACEMENT Right 09/25/2019   Procedure: Deep brain stimulator battery change;  Surgeon: Unice Pac, MD;  Location: [facility] OR;  Service: Neurosurgery;  Laterality: Right;   SUBTHALAMIC STIMULATOR BATTERY REPLACEMENT Right 12/15/2022   Procedure: IMPLANTABLE PULSE GENERATOR  BATTERY REPLACEMENT;  Surgeon: Carollee Lani BROCKS, DO;  Location: [facility] OR;  Service: Neurosurgery;  Laterality: Right;  3C   TONSILLECTOMY     Patient Active Problem List   Diagnosis Date Noted   Skin tear of hand without complication 08/03/2023   Delirium 05/29/2023   Healthcare maintenance 04/08/2023   Cerumen impaction 04/08/2023   Hordeolum 03/21/2023   Pancreatic cyst 03/05/2022   Lower respiratory infection 02/20/2022   Lobar pneumonia (HCC) 02/20/2022   Sore throat 01/12/2022   Cyst of perianal area 01/12/2022   Strep pharyngitis 12/16/2021   Wheezing 12/16/2021   DOE (dyspnea on exertion) 12/16/2021   Parkinson's disease with EBS (electrical brain stimulation) [G20.A1] 12/05/2021   Fall at home 06/15/2021   Asthma    Skin lesion 05/30/2019   CAD (coronary artery disease) 08/25/2018   Diabetes mellitus without complication (HCC) 08/15/2018   Thrombocytopenia (HCC) 07/08/2017   Gout, unspecified 06/06/2017   Dysuria 05/25/2017   Microscopic hematuria 05/25/2017   CKD (chronic kidney disease) stage 3, GFR 30-59 ml/min (HCC) 04/12/2017   Neuropathy 02/25/2017   Urinary  frequency 09/19/2016   Constipation 11/27/2015   Medicare annual wellness visit, initial 09/23/2015   Advance care planning 09/23/2015   Spinal stenosis of lumbar region 01/26/2015   History of colonic polyps    Restless leg syndrome 08/11/2014   ICD- MDT, implanted 11/13/13 11/14/2013   Chronic systolic CHF (congestive heart failure) (HCC) 11/13/2013   SOB (shortness of breath), mostly on exertion 06/05/2013   Acute on chronic combined systolic and diastolic congestive heart failure, NYHA class 4 (HCC) 05/12/2013   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   Umbilical hernia 01/27/2013   Elevated PSA 12/04/2012   Other malaise and fatigue 12/04/2012   PD (Parkinson's disease) (HCC) 08/19/2012   OSA on CPAP 04/17/2012   Dysautonomia (HCC) 04/17/2012   Obesity, morbid (HCC)  04/17/2012   Hypothyroidism following radioiodine therapy 10/16/2011   GERD 05/11/2009   Type 2 diabetes mellitus with chronic kidney disease, with long-term current use of insulin  (HCC) 04/26/2009   Back pain 08/18/2008   OTHER SPEC FORMS CHRONIC ISCHEMIC HEART DISEASE 04/24/2008   HYPERPLASIA PROSTATE UNS W/O UR OBST & OTH LUTS 09/02/2007   NEPHROLITHIASIS, HX OF 09/02/2007   Ventral hernia 12/28/2006   Hyperlipidemia 08/31/2006   HTN (hypertension) 08/31/2006   Hx of CABG x 3 in 2000 08/31/2006    ONSET DATE: 08/09/2023 (referral)   REFERRING DIAG: G24.8 (ICD-10-CM) - 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 States he is doing well. Had one slip since last session, but unable to understand pt. Wife unaware of what pt is referring to. Pt reports it was not a fall, just a slip.   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 No  PRECAUTIONS: Fall, ICD/Pacemaker, and Other: DBS  RED FLAGS: None   WEIGHT BEARING RESTRICTIONS: No  FALLS: Has patient fallen in last 6 months? Yes. Number of falls 2  LIVING ENVIRONMENT: Lives with: lives with their spouse and Oldest Daughter and her husband Lives in: House/apartment Stairs: Ramped entry  Has following equipment at home: Environmental consultant - 2 wheeled, Environmental consultant - 4 wheeled, Wheelchair (manual), [employer], Grab bars, and U-Step walker   PLOF: Requires assistive device for independence, Needs assistance with ADLs, Needs assistance with gait, and Needs assistance with transfers  PATIENT GOALS: I don't know I just want to keep my strength   OBJECTIVE:  Note: Objective measures were completed at Evaluation unless otherwise noted.  DIAGNOSTIC FINDINGS: None relevant   COGNITION: Overall cognitive status: History of cognitive impairments - at baseline   SENSATION: Pt denied numbness/tingling in BLEs  COORDINATION: Dystonia in LLE    POSTURE: rounded shoulders, forward head, increased thoracic kyphosis, posterior pelvic tilt, and  flexed trunk   LOWER EXTREMITY ROM:     Active  Right Eval Left Eval  Hip flexion    Hip extension    Hip abduction    Hip adduction    Hip internal rotation    Hip external rotation    Knee flexion    Knee extension    Ankle dorsiflexion    Ankle plantarflexion    Ankle inversion    Ankle eversion     (Blank rows = not tested)  LOWER EXTREMITY MMT:    MMT Right Eval  Left Eval  Hip flexion    Hip extension    Hip abduction    Hip adduction    Hip internal rotation    Hip external rotation    Knee flexion    Knee extension    Ankle dorsiflexion    Ankle plantarflexion    Ankle inversion    Ankle eversion    (Blank rows = not tested)  BED MOBILITY:  Not tested Pt frequently sleeps in recliner (lift chair)   TRANSFERS: Sit to stand: CGA  Assistive device utilized: U-Step     Stand to sit: CGA and Min A  Assistive device utilized: U-step      Pt does not fully turn to sit and pushes U-Step away from body, requiring min A to ensure pt did not miss seat or fall forwards when pushing U-Step away. Pt reports he did this because he was fatigued. Pt also demonstrates poor anterior weight shift w/heavy posterior bracing on chair to stand.   RAMP:  Not tested  CURB:  Not tested  STAIRS: Not tested GAIT: Gait pattern: step to pattern, decreased stride length, decreased hip/knee flexion- Right, decreased hip/knee flexion- Left, decreased ankle dorsiflexion- Right, decreased ankle dorsiflexion- Left, shuffling, trunk flexed, wide BOS, poor foot clearance- Right, and poor foot clearance- Left Distance walked: Various short clinic distances  Assistive device utilized: U-Step  Level of assistance: CGA Comments: Pt assumes severe forward-flexed posture w/arms extended on walker. Max verbal and tactile cues provided to facilitate upright posture, which pt able to maintain for brief amount of time prior to resuming forward flexed posture.     FUNCTIONAL TESTS:                                                                                                                                  TREATMENT :   Ther Act  Pt demonstrated a full turn to sit on mat today, but crossed his feet while turning and almost fell forward. Pt reports he learned to cross his feet from a different therapist (karaoke walking). Advised pt not to cross feet, especially while  turning, as this increases pt risk of falling due to narrow stance. Pt reports he will erase it from my mind SciFit multi-peaks level 8.5 for 8 minutes using BUE/BLEs for neural priming for reciprocal movement, dynamic cardiovascular warmup and increased amplitude of  stepping. RPE of 6/10 following activity  To work on postural control, periscapular strength and posterior chain strength:  Seated shoulder abduction w/black band and 5s isometric hold, x8 reps. Pt reported difficulty w/isometric hold. Added to HEP (see bolded below)  Seated DL t/84# KB, k79 reps. 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 Pt reports he has a 5# KB at home, so recommended he work on this. Added to HEP under instructions (see bolded below)  Sit to stands w/15# KB in goblet position, x5 reps. Pt initially demonstrated poor eccentric control, so cued for improved eccentric control which pt able to do well. Pt only willing to perform 5 reps because he has to be able to walk tomorrow  Pt inquiring why he can stand from mat table and not his lift chair. Pt reports he has to brace his legs on lift chair to stand. Recommended pt not use lift function of chair, as it tilts you forward, and instead lower chair so that the seat is flat and perform sit to stand w/good technique. Pt reports he will try this.  Escorted pt to bathroom as clinic's bathroom doors are heavy and pt unable to manage independently.     PATIENT EDUCATION: Education details: Initial HEP, how to stand from lift chair  Person educated: Patient and Spouse Education method: Explanation, Demonstration, Tactile cues, Verbal cues, and Handouts Education comprehension: verbalized understanding, returned demonstration, verbal cues required, tactile cues required, and needs further education  HOME EXERCISE PROGRAM: Access Code: [email or web address] URL: [email or web address] Date: 09/05/2023 Prepared by: Marlon  Jamirah Zelaya  Program Notes You may perform seated deadlifts with your 5lb kettlebell, 3 sets of 10-15 repetitions   Exercises - Seated Shoulder Horizontal Abduction with Resistance  - 1 x daily - 7 x weekly - 2-3 sets - 10 reps - 3-5 seconds hold  GOALS: Goals reviewed with patient? Yes  SHORT TERM GOALS: Target date: 09/20/2023   Pt will perform floor transfer w/min A from wife for improved fall recovery and safety at home  Baseline: wife reports she can get pt up if he gets into quadruped, but has a hard time with this  Goal status: INITIAL  2.  Pt will improve 5 x STS to less than or equal to 20 seconds with proper body mechanics and BUE support to demonstrate improved functional strength and transfer efficiency.   Baseline: 17.34s w/BUE support and improper body mechanics bracing against the chair  Goal status: INITIAL  3.  TUG to be assessed and LTG updated  Baseline:  Goal status: MET  4.  Pt will improve gait velocity to at least 1.85 ft/s w/U-Step for improved gait efficiency and reduced fall risk   Baseline: 1.64 ft/s w/U-Step  Goal status: INITIAL   LONG TERM GOALS: Target date: 10/04/2023   Pt will perform final HEP w/min A from wife for improved strength, balance, transfers and gait. Baseline:  Goal status: INITIAL  2.  Pt will improve gait velocity to at least 2.0 ft/s w/U-Step for improved gait efficiency and endurance  Baseline: 1.64 ft/s w/U-Step  Goal status: INITIAL  3.  Pt will improve normal TUG to less than or equal to 17 seconds for improved functional mobility and decreased fall risk.  Baseline: 20.03s w/U-step and CGA Goal status: REVISED  4.  Pt will verbalize understanding of local PD community resources, including fitness post DC.  Baseline:  Goal status: INITIAL  ASSESSMENT:  CLINICAL IMPRESSION:  Emphasis of skilled PT session on establishing initial HEP for improved postural control, posterior chain strength and endurance. 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 Pt initially crossing his feet to turn to sit today, resulting in significant anterior instability, so cued pt to avoid this as he is much more stable without crossing feet. Pt continues to demonstrate significant forward flexed posture w/gait, especially when fatigued, but has improved since eval. Continue POC.    OBJECTIVE IMPAIRMENTS: Abnormal gait, decreased activity tolerance, decreased balance, decreased cognition, decreased coordination, decreased endurance, decreased knowledge of use of DME, decreased mobility, difficulty walking, decreased strength, decreased safety awareness, impaired perceived functional ability, increased muscle spasms, improper body mechanics, and postural dysfunction.   ACTIVITY LIMITATIONS: carrying, lifting, sitting, standing, transfers, bed mobility, continence, bathing, dressing, reach over head, hygiene/grooming, locomotion level, and caring for others  PARTICIPATION LIMITATIONS: meal prep, cleaning, laundry, medication management, personal finances, interpersonal relationship, driving, shopping, community activity, and yard work  PERSONAL FACTORS: Behavior pattern, Fitness, Past/current experiences, and 1-2 comorbidities: PD and dysotnia in LLE are also affecting patient's functional outcome.   REHAB POTENTIAL: Fair Due to severity of deficits   CLINICAL DECISION MAKING: Evolving/moderate complexity  EVALUATION COMPLEXITY: Moderate  PLAN:  PT FREQUENCY: 2x/week  PT DURATION: 6 weeks  PLANNED INTERVENTIONS: 97164- PT Re-evaluation, 97750- Physical Performance Testing, 97110-Therapeutic exercises, 97530- Therapeutic activity, [id number]- Neuromuscular re-education, 97535- Self Care, [id number]- Manual therapy, [id number]- Gait training, [phone]- Electrical stimulation (manual), [phone] (1-2 muscles), [id number] (3+ muscles)- Dry Needling, Patient/Family education, Balance  training, Stair training, Joint mobilization, Spinal mobilization, Vestibular training, DME instructions, and Moist heat  PLAN FOR NEXT SESSION: Work on endurance (Scifit, gait trials), upright posture, safety awareness. Add to HEP that he and his wife can do at home (seated PWR?). TURNS. Lifting    Marlon BRAVO Clotile Whittington, PT, DPT 09/05/2023, 4:17 PM

## 2023-09-07 ENCOUNTER — Ambulatory Visit: Admitting: Physical Therapy

## 2023-09-07 ENCOUNTER — Encounter: Payer: Self-pay | Admitting: Physical Therapy

## 2023-09-07 DIAGNOSIS — G248 Other dystonia: Secondary | ICD-10-CM | POA: Diagnosis not present

## 2023-09-07 DIAGNOSIS — R293 Abnormal posture: Secondary | ICD-10-CM

## 2023-09-07 DIAGNOSIS — M6281 Muscle weakness (generalized): Secondary | ICD-10-CM

## 2023-09-07 DIAGNOSIS — R278 Other lack of coordination: Secondary | ICD-10-CM | POA: Diagnosis not present

## 2023-09-07 DIAGNOSIS — R2689 Other abnormalities of gait and mobility: Secondary | ICD-10-CM | POA: Diagnosis not present

## 2023-09-07 DIAGNOSIS — Z9181 History of falling: Secondary | ICD-10-CM

## 2023-09-07 NOTE — Therapy (Signed)
 OUTPATIENT PHYSICAL THERAPY NEURO TREATMENT   Patient Name: Henry [name] MRN: [id number] DOB:Mar 02, 1950, 73 y.o., male Today's Date: 09/07/2023   PCP: Cleatus Arlyss RAMAN, MD  REFERRING PROVIDER: Carilyn Prentice BRAVO, MD  END OF SESSION:  PT End of Session - 09/07/23 1234     Visit Number 5    Number of Visits 13    Date for PT Re-Evaluation 10/11/23    Authorization Type Medicare    PT Start Time 1232    PT Stop Time 1312    PT Time Calculation (min) 40 min    Equipment Utilized During Treatment Gait belt    Activity Tolerance Patient tolerated treatment well    Behavior During Therapy WFL for tasks assessed/performed            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)   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   Dysrhythmia    a-fib   GERD (gastroesophageal reflux disease)    Glaucoma    right eye   History of kidney stones    multiple   HLD (hyperlipidemia)    HTN (hypertension)    pt denies 08/19/12   Hyperplasia, prostate    Hypothyroidism    MI (myocardial infarction) (HCC)    Dr Pietro 2000, x3vessels bypass   Neuromuscular disorder (HCC)    Parkinson's Disease   OSA (obstructive sleep apnea)    AHI-28,on CPAP, noncompliant with CPAP   Parkinson disease (HCC)    1999   PONV (postoperative nausea and vomiting)    Restless legs    Shortness of breath    Hx: of at all times   UTI (lower urinary tract infection) 09/15/2012   Klebsiella   Ventral hernia    Walker as ambulation aid    also uses wheelchair at home/when going out   Past Surgical History:  Procedure Laterality Date    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   BIOPSY  10/14/2018   Procedure: BIOPSY;  Surgeon: Albertus Gordy HERO, MD;  Location: [facility];  Service: Gastroenterology;;   CATARACT EXTRACTION W/ INTRAOCULAR LENS IMPLANT     Hx: of right eye   CATARACT EXTRACTION W/ INTRAOCULAR LENS IMPLANT Left 2018   COLONOSCOPY N/A 10/13/2014   Procedure: COLONOSCOPY;  Surgeon: Gordy HERO Albertus, MD;  Location: [facility];  Service: Gastroenterology;  Laterality: N/A;   COLONOSCOPY W/ BIOPSIES AND POLYPECTOMY     Hx: of   COLONOSCOPY WITH PROPOFOL  N/A 10/14/2018   Procedure: COLONOSCOPY WITH PROPOFOL ;  Surgeon: Albertus Gordy HERO, MD;  Location: [facility];  Service: Gastroenterology;  Laterality: N/A;   CORONARY ARTERY BYPASS GRAFT  2000   Sudie Laine, MD   CORONARY STENT PLACEMENT  1998   DEEP BRAIN STIMULATOR PLACEMENT  2004   Right and left VIN stimulator placement (parkinsons)   EYE SURGERY     Cataract removed in 1980s related to wood in eye when 73 years old   FINGER AMPUTATION  left pointer   IMPLANTABLE CARDIOVERTER DEFIBRILLATOR IMPLANT N/A 11/13/2013   Procedure: IMPLANTABLE CARDIOVERTER DEFIBRILLATOR IMPLANT;  Surgeon: Danelle LELON Birmingham, MD;  Location: [facility] CATH [facility];  Service: Cardiovascular;  Laterality: N/A;   INSERT / REPLACE / REMOVE PACEMAKER     medtronic   LEFT AND RIGHT HEART CATHETERIZATION WITH CORONARY ANGIOGRAM N/A 09/24/2013   Procedure: LEFT AND RIGHT HEART CATHETERIZATION WITH CORONARY ANGIOGRAM;  Surgeon: Lonni JONETTA Cash, MD;  Location: [facility] CATH LAB;  Service: Cardiovascular;  Laterality: N/A;   LITHOTRIPSY     3 different times   POLYPECTOMY  10/14/2018   Procedure: POLYPECTOMY;  Surgeon: Albertus Gordy HERO, MD;  Location: [facility];  Service: Gastroenterology;;   PULSE GENERATOR IMPLANT Right 11/13/2017   Procedure: Right chest implantable pulse generator change;  Surgeon: Unice Pac, MD;  Location[facility] OR;  Service: Neurosurgery;  Laterality: Right;  Right  chest implantable pulse generator change   SUBTHALAMIC STIMULATOR BATTERY REPLACEMENT N/A 09/05/2012   Procedure: Deep brain stimulator battery change;  Surgeon: Pac Unice, MD;  Location: [facility] NEURO [facility]S;  Service: Neurosurgery;  Laterality: N/A;  Deep brain stimulator battery change   SUBTHALAMIC STIMULATOR BATTERY REPLACEMENT N/A 06/10/2015   Procedure: Deep Brain stimulator battery change;  Surgeon: Pac Unice, MD;  Location: [facility]URO [facility];  Service: Neurosurgery;  Laterality: N/A;   SUBTHALAMIC STIMULATOR BATTERY REPLACEMENT Right 09/25/2019   Procedure: Deep brain stimulator battery change;  Surgeon: Unice Pac, MD;  Location: [facility] OR;  Service: Neurosurgery;  Laterality: Right;   SUBTHALAMIC STIMULATOR BATTERY REPLACEMENT Right 12/15/2022   Procedure: IMPLANTABLE PULSE GENERATOR  BATTERY REPLACEMENT;  Surgeon: Carollee Lani BROCKS, DO;  Location: [facility] OR;  Service: Neurosurgery;  Laterality: Right;  3C   TONSILLECTOMY     Patient Active Problem List   Diagnosis Date Noted   Skin tear of hand without complication 08/03/2023   Delirium 05/29/2023   Healthcare maintenance 04/08/2023   Cerumen impaction 04/08/2023   Hordeolum 03/21/2023   Pancreatic cyst 03/05/2022   Lower respiratory infection 02/20/2022   Lobar pneumonia (HCC) 02/20/2022   Sore throat 01/12/2022   Cyst of perianal area 01/12/2022   Strep pharyngitis 12/16/2021   Wheezing 12/16/2021   DOE (dyspnea on exertion) 12/16/2021   Parkinson's disease with EBS (electrical brain stimulation) [G20.A1] 12/05/2021   Fall at home 06/15/2021   Asthma    Skin lesion 05/30/2019   CAD (coronary artery disease) 08/25/2018   Diabetes mellitus without complication (HCC) 08/15/2018   Thrombocytopenia (HCC) 07/08/2017   Gout, unspecified 06/06/2017   Dysuria 05/25/2017   Microscopic hematuria 05/25/2017   CKD (chronic kidney disease) stage 3, GFR 30-59 ml/min (HCC) 04/12/2017   Neuropathy 02/25/2017   Urinary frequency 09/19/2016    Constipation 11/27/2015   Medicare annual wellness visit, initial 09/23/2015   Advance care planning 09/23/2015   Spinal stenosis of lumbar region 01/26/2015   History of colonic polyps    Restless leg syndrome 08/11/2014   ICD- MDT, implanted 11/13/13 11/14/2013   Chronic systolic CHF (congestive heart failure) (HCC) 11/13/2013   SOB (shortness of breath), mostly on exertion 06/05/2013   Acute on chronic combined systolic and diastolic congestive heart failure, NYHA class 4 (HCC) 05/12/2013   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   Umbilical hernia 01/27/2013   Elevated PSA 12/04/2012   Other malaise and fatigue 12/04/2012   PD (Parkinson's disease) (HCC) 08/19/2012   OSA on CPAP 04/17/2012   Dysautonomia (HCC) 04/17/2012   Obesity, morbid (HCC) 04/17/2012   Hypothyroidism following radioiodine  therapy 10/16/2011   GERD 05/11/2009   Type 2 diabetes mellitus with chronic kidney disease, with long-term current use of insulin  (HCC) 04/26/2009   Back pain 08/18/2008   OTHER SPEC FORMS CHRONIC ISCHEMIC HEART DISEASE 04/24/2008   HYPERPLASIA PROSTATE UNS W/O UR OBST & OTH LUTS 09/02/2007   NEPHROLITHIASIS, HX OF 09/02/2007   Ventral hernia 12/28/2006   Hyperlipidemia 08/31/2006   HTN (hypertension) 08/31/2006   Hx of CABG x 3 in 2000 08/31/2006    ONSET DATE: 08/09/2023 (referral)   REFERRING DIAG: G24.8 (ICD-10-CM) - 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 Tried his exercises at home.    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 No  PRECAUTIONS: Fall, ICD/Pacemaker, and Other: DBS  RED FLAGS: None   WEIGHT BEARING RESTRICTIONS: No  FALLS: Has patient fallen in last 6 months? Yes. Number of falls 2  LIVING ENVIRONMENT: Lives with: lives with their spouse and Oldest Daughter and her husband Lives in: House/apartment Stairs: Ramped entry  Has following equipment at home: Environmental consultant - 2 wheeled, Environmental consultant - 4 wheeled, Wheelchair (manual), [employer], Grab bars, and U-Step walker   PLOF: Requires assistive device for independence, Needs assistance with ADLs, Needs assistance with gait, and Needs assistance with transfers  PATIENT GOALS: I don't know I just want to keep my strength   OBJECTIVE:  Note: Objective measures were completed at Evaluation unless otherwise noted.  DIAGNOSTIC FINDINGS: None relevant   COGNITION: Overall cognitive status: History of cognitive impairments - at baseline   SENSATION: Pt denied numbness/tingling in BLEs  COORDINATION: Dystonia in LLE    POSTURE: rounded shoulders, forward head, increased thoracic kyphosis, posterior pelvic tilt, and flexed trunk   LOWER EXTREMITY ROM:     Active  Right Eval Left Eval  Hip flexion    Hip extension    Hip abduction    Hip adduction  Hip internal rotation    Hip external rotation    Knee flexion    Knee extension    Ankle dorsiflexion    Ankle plantarflexion    Ankle inversion    Ankle eversion     (Blank rows = not tested)  LOWER EXTREMITY MMT:    MMT Right Eval Left Eval  Hip flexion    Hip extension    Hip abduction    Hip adduction    Hip internal rotation    Hip external rotation    Knee flexion    Knee extension    Ankle dorsiflexion    Ankle plantarflexion     Ankle inversion    Ankle eversion    (Blank rows = not tested)  BED MOBILITY:  Not tested Pt frequently sleeps in recliner (lift chair)   TRANSFERS: Sit to stand: CGA  Assistive device utilized: U-Step     Stand to sit: CGA and Min A  Assistive device utilized: U-step      Pt does not fully turn to sit and pushes U-Step away from body, requiring min A to ensure pt did not miss seat or fall forwards when pushing U-Step away. Pt reports he did this because he was fatigued. Pt also demonstrates poor anterior weight shift w/heavy posterior bracing on chair to stand.   RAMP:  Not tested  CURB:  Not tested  STAIRS: Not tested GAIT: Gait pattern: step to pattern, decreased stride length, decreased hip/knee flexion- Right, decreased hip/knee flexion- Left, decreased ankle dorsiflexion- Right, decreased ankle dorsiflexion- Left, shuffling, trunk flexed, wide BOS, poor foot clearance- Right, and poor foot clearance- Left Distance walked: Various short clinic distances  Assistive device utilized: U-Step  Level of assistance: CGA Comments: Pt assumes severe forward-flexed posture w/arms extended on walker. Max verbal and tactile cues provided to facilitate upright posture, which pt able to maintain for brief amount of time prior to resuming forward flexed posture.     FUNCTIONAL TESTS:                                                                                                                                  TREATMENT :    Ther Act  When turning to mat table and Sci-Fit, cued to turn all the way and pick up feet, and cued to reach back towards surface whenever he is going to sit down instead of having both hands on U-Step. Pt did well not crossing his feet when turning. When getting up to sit from the mat table, cued to press up from the mat instead of having both hands on U-Step rollator With gait into session and during clinic - cued to try to stay closer to U-Step rollator and for  more upright posture as pt pushes it more anteriorly and has more forward flexed posture. Asked pt if he has tried to use the laser feature on his U-Step to see if  it helps with staying closer to U-Step or taking longer steps, pt reporting he has tried it before, but it did not help him.  SciFit multi-peaks level 8.5 for 8 minutes using BUE/BLEs for neural priming for reciprocal movement, dynamic cardiovascular warmup and increased amplitude of stepping. Pt reporting RPE as 5/10 At end of session, performed 7 reps of sit <> stands from mat table with focus on using BUE from mat table and incr forward lean. In standing, cued for tall posture. Pt had one episode of retropulsion where he fell back onto mat table   NMR:  Pt performs PWR! Moves in seated position:    PWR! Up for improved posture 2 x 10 reps, cues to look up to a target at the ceiling   PWR! Rock for improved weight shifting x10 reps   PWR! Twist for improved trunk rotation x10 reps, cues to reset tall in the middle, pt with decr trunk rotation to the R side   PWR! Step for improved step initiation x10 reps single step in and out, performed an additional x10 reps with double step in and out, needing cues for sequencing, a couple instances where pt's feet got crossed   Cues provided for technique, larger amplitude movement, and looking at hands with head/eyes when reaching  Pt initially reporting no effort and cued to try to work towards a 6/10 RPE. At end of exercises, pt reporting RPE as 6/10   PATIENT EDUCATION: Education details: Seated PWR moves to [employer]  Person educated: Patient and Spouse Education method: Explanation, Demonstration, Tactile cues, Verbal cues, and Handouts Education comprehension: verbalized understanding, returned demonstration, verbal cues required, tactile cues required, and needs further education  HOME EXERCISE PROGRAM: Seated PWR moves   Access Code: [email or web address] URL:  [email or web address] Date: 09/05/2023 Prepared by: Marlon Plaster  Program Notes You may perform seated deadlifts with your 5lb kettlebell, 3 sets of 10-15 repetitions   Exercises - Seated Shoulder Horizontal Abduction with Resistance  - 1 x daily - 7 x weekly - 2-3 sets - 10 reps - 3-5 seconds hold  GOALS: Goals reviewed with patient? Yes  SHORT TERM GOALS: Target date: 09/20/2023   Pt will perform floor transfer w/min A from wife for improved fall recovery and safety at home  Baseline: wife reports she can get pt up if he gets into quadruped, but has a hard time with this  Goal status: INITIAL  2.  Pt will improve 5 x STS to less than or equal to 20 seconds with proper body mechanics and BUE support to demonstrate improved functional strength and transfer efficiency.   Baseline: 17.34s w/BUE support and improper body mechanics bracing against the chair  Goal status: INITIAL  3.  TUG to be assessed and LTG updated  Baseline:  Goal status: MET  4.  Pt will improve gait velocity to at least 1.85 ft/s w/U-Step for improved gait efficiency and reduced fall risk   Baseline: 1.64 ft/s w/U-Step  Goal status: INITIAL   LONG TERM GOALS: Target date: 10/04/2023   Pt will perform final HEP w/min A from wife for improved strength, balance, transfers and gait. Baseline:  Goal status: INITIAL  2.  Pt will improve gait velocity to at least 2.0 ft/s w/U-Step for improved gait efficiency and endurance  Baseline: 1.64 ft/s w/U-Step  Goal status: INITIAL  3.  Pt will improve normal TUG to less than or equal to 17 seconds for improved functional mobility and decreased fall risk.  Baseline: 20.03s  w/U-step and CGA Goal status: REVISED  4.  Pt will verbalize understanding of local PD community resources, including fitness post DC.  Baseline:  Goal status: INITIAL  ASSESSMENT:  CLINICAL IMPRESSION: Pt demonstrating improvement in turning technique compared to  previous session, did not have any episodes when he crossed his feet to sit, but did need reminder cues to make sure he turned all the way before reaching back to sit down. Worked on adding in seated PWR moves to HEP to address larger amplitude movements and mobility. Pt tolerated well and reports he performed some variation of these BIG exercises back in 2020, but has not done any since. Pt more challenged with stepping and trunk rotation to the R. Pt's spouse present during session to help with carryover for home. Pt continues to demo incr forward flexed posture with gait and having U-Step rollator farther anteriorly. Will continue per POC.    OBJECTIVE IMPAIRMENTS: Abnormal gait, decreased activity tolerance, decreased balance, decreased cognition, decreased coordination, decreased endurance, decreased knowledge of use of DME, decreased mobility, difficulty walking, decreased strength, decreased safety awareness, impaired perceived functional ability, increased muscle spasms, improper body mechanics, and postural dysfunction.   ACTIVITY LIMITATIONS: carrying, lifting, sitting, standing, transfers, bed mobility, continence, bathing, dressing, reach over head, hygiene/grooming, locomotion level, and caring for others  PARTICIPATION LIMITATIONS: meal prep, cleaning, laundry, medication management, personal finances, interpersonal relationship, driving, shopping, community activity, and yard work  PERSONAL FACTORS: Behavior pattern, Fitness, Past/current experiences, and 1-2 comorbidities: PD and dysotnia in LLE are also affecting patient's functional outcome.   REHAB POTENTIAL: Fair Due to severity of deficits   CLINICAL DECISION MAKING: Evolving/moderate complexity  EVALUATION COMPLEXITY: Moderate  PLAN:  PT FREQUENCY: 2x/week  PT DURATION: 6 weeks  PLANNED INTERVENTIONS: 97164- PT Re-evaluation, 97750- Physical Performance Testing, 97110-Therapeutic exercises, 97530- Therapeutic activity,  [id number]- Neuromuscular re-education, 97535- Self Care, [id number]- Manual therapy, [id number]- Gait training, [phone]- Electrical stimulation (manual), [phone] (1-2 muscles), [id number] (3+ muscles)- Dry Needling, Patient/Family education, Balance training, Stair training, Joint mobilization, Spinal mobilization, Vestibular training, DME instructions, and Moist heat  PLAN FOR NEXT SESSION: Work on endurance (Scifit, gait trials), upright posture, safety awareness. TURNS. Lifting. Work on standing posture/weight shifting at [employer] with [employer]   How were seated PWR moves?    [place] LOISE Senate, PT, DPT 09/07/2023, 1:28 PM

## 2023-09-11 ENCOUNTER — Ambulatory Visit: Attending: Physical Medicine & Rehabilitation | Admitting: Physical Therapy

## 2023-09-11 ENCOUNTER — Encounter: Payer: Self-pay | Admitting: Physical Therapy

## 2023-09-11 VITALS — BP 118/66 | HR 69

## 2023-09-11 DIAGNOSIS — R2689 Other abnormalities of gait and mobility: Secondary | ICD-10-CM | POA: Diagnosis not present

## 2023-09-11 DIAGNOSIS — R2681 Unsteadiness on feet: Secondary | ICD-10-CM | POA: Diagnosis not present

## 2023-09-11 DIAGNOSIS — R29898 Other symptoms and signs involving the musculoskeletal system: Secondary | ICD-10-CM | POA: Diagnosis not present

## 2023-09-11 DIAGNOSIS — Z9181 History of falling: Secondary | ICD-10-CM | POA: Diagnosis not present

## 2023-09-11 DIAGNOSIS — R29818 Other symptoms and signs involving the nervous system: Secondary | ICD-10-CM | POA: Diagnosis not present

## 2023-09-11 DIAGNOSIS — R41840 Attention and concentration deficit: Secondary | ICD-10-CM | POA: Insufficient documentation

## 2023-09-11 DIAGNOSIS — M6281 Muscle weakness (generalized): Secondary | ICD-10-CM | POA: Insufficient documentation

## 2023-09-11 DIAGNOSIS — R278 Other lack of coordination: Secondary | ICD-10-CM | POA: Diagnosis not present

## 2023-09-11 DIAGNOSIS — R41844 Frontal lobe and executive function deficit: Secondary | ICD-10-CM | POA: Diagnosis not present

## 2023-09-11 DIAGNOSIS — R293 Abnormal posture: Secondary | ICD-10-CM | POA: Insufficient documentation

## 2023-09-11 NOTE — Therapy (Addendum)
 OUTPATIENT PHYSICAL THERAPY NEURO TREATMENT   Patient Name: Henry [name] MRN: [id number] DOB:June 12, 1950, 73 y.o., male Today's Date: 09/11/2023   PCP: Cleatus Arlyss RAMAN, MD  REFERRING PROVIDER: Carilyn Prentice BRAVO, MD  END OF SESSION:  PT End of Session - 09/11/23 1452     Visit Number 6    Number of Visits 13    Date for PT Re-Evaluation 10/11/23    Authorization Type Medicare    PT Start Time 1450    PT Stop Time 1530    PT Time Calculation (min) 40 min    Equipment Utilized During Treatment Gait belt    Activity Tolerance Patient tolerated treatment well    Behavior During Therapy WFL for tasks assessed/performed            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)   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   Dysrhythmia    a-fib   GERD (gastroesophageal reflux disease)    Glaucoma    right eye   History of kidney stones    multiple   HLD (hyperlipidemia)    HTN (hypertension)    pt denies 08/19/12   Hyperplasia, prostate    Hypothyroidism    MI (myocardial infarction) (HCC)    Dr Pietro 2000, x3vessels bypass   Neuromuscular disorder (HCC)    Parkinson's Disease   OSA (obstructive sleep apnea)    AHI-28,on CPAP, noncompliant with CPAP   Parkinson disease (HCC)    1999   PONV (postoperative nausea and vomiting)    Restless legs    Shortness of breath    Hx: of at all times   UTI (lower urinary tract infection) 09/15/2012   Klebsiella   Ventral hernia    Walker as ambulation aid    also uses wheelchair at home/when going out   Past Surgical History:  Procedure Laterality Date    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   BIOPSY  10/14/2018   Procedure: BIOPSY;  Surgeon: Albertus Gordy HERO, MD;  Location: [facility];  Service: Gastroenterology;;   CATARACT EXTRACTION W/ INTRAOCULAR LENS IMPLANT     Hx: of right eye   CATARACT EXTRACTION W/ INTRAOCULAR LENS IMPLANT Left 2018   COLONOSCOPY N/A 10/13/2014   Procedure: COLONOSCOPY;  Surgeon: Gordy HERO Albertus, MD;  Location: [facility];  Service: Gastroenterology;  Laterality: N/A;   COLONOSCOPY W/ BIOPSIES AND POLYPECTOMY     Hx: of   COLONOSCOPY WITH PROPOFOL  N/A 10/14/2018   Procedure: COLONOSCOPY WITH PROPOFOL ;  Surgeon: Albertus Gordy HERO, MD;  Location: [facility];  Service: Gastroenterology;  Laterality: N/A;   CORONARY ARTERY BYPASS GRAFT  2000   Sudie Laine, MD   CORONARY STENT PLACEMENT  1998   DEEP BRAIN STIMULATOR PLACEMENT  2004   Right and left VIN stimulator placement (parkinsons)   EYE SURGERY     Cataract removed in 1980s related to wood in eye when 73 years old   FINGER AMPUTATION  left pointer   IMPLANTABLE CARDIOVERTER DEFIBRILLATOR IMPLANT N/A 11/13/2013   Procedure: IMPLANTABLE CARDIOVERTER DEFIBRILLATOR IMPLANT;  Surgeon: Danelle LELON Birmingham, MD;  Location: [facility] CATH LAB;  Service: Cardiovascular;  Laterality: N/A;   INSERT / REPLACE / REMOVE PACEMAKER     medtronic   LEFT AND RIGHT HEART CATHETERIZATION WITH CORONARY ANGIOGRAM N/A 09/24/2013   Procedure: LEFT AND RIGHT HEART CATHETERIZATION WITH CORONARY ANGIOGRAM;  Surgeon: Lonni JONETTA Cash, MD;  Location: [facility] CATH LAB;  Service: Cardiovascular;  Laterality: N/A;   LITHOTRIPSY     3 different times   POLYPECTOMY  10/14/2018   Procedure: POLYPECTOMY;  Surgeon: Albertus Gordy HERO, MD;  Location: [facility];  Service: Gastroenterology;;   PULSE GENERATOR IMPLANT Right 11/13/2017   Procedure: Right chest implantable pulse generator change;  Surgeon: Unice Pac, MD;  Location: [facility] OR;  Service: Neurosurgery;  Laterality: Right;  Right  chest implantable pulse generator change   SUBTHALAMIC STIMULATOR BATTERY REPLACEMENT N/A 09/05/2012   Procedure: Deep brain stimulator battery change;  Surgeon: Pac Unice, MD;  Location: [facility]O [facility];  Service: Neurosurgery;  Laterality: N/A;  Deep brain stimulator battery change   SUBTHALAMIC STIMULATOR BATTERY REPLACEMENT N/A 06/10/2015   Procedure: Deep Brain stimulator battery change;  Surgeon: Pac Unice, MD;  Location: [facility];  Service: Neurosurgery;  Laterality: N/A;   SUBTHALAMIC STIMULATOR BATTERY REPLACEMENT Right 09/25/2019   Procedure: Deep brain stimulator battery change;  Surgeon: Unice Pac, MD;  Location: [facility] OR;  Service: Neurosurgery;  Laterality: Right;   SUBTHALAMIC STIMULATOR BATTERY REPLACEMENT Right 12/15/2022   Procedure: IMPLANTABLE PULSE GENERATOR  BATTERY REPLACEMENT;  Surgeon: Carollee Lani BROCKS, DO;  Location: [facility] OR;  Service: Neurosurgery;  Laterality: Right;  3C   TONSILLECTOMY     Patient Active Problem List   Diagnosis Date Noted   Skin tear of hand without complication 08/03/2023   Delirium 05/29/2023   Healthcare maintenance 04/08/2023   Cerumen impaction 04/08/2023   Hordeolum 03/21/2023   Pancreatic cyst 03/05/2022   Lower respiratory infection 02/20/2022   Lobar pneumonia (HCC) 02/20/2022   Sore throat 01/12/2022   Cyst of perianal area 01/12/2022   Strep pharyngitis 12/16/2021   Wheezing 12/16/2021   DOE (dyspnea on exertion) 12/16/2021   Parkinson's disease with EBS (electrical brain stimulation) [G20.A1] 12/05/2021   Fall at home 06/15/2021   Asthma    Skin lesion 05/30/2019   CAD (coronary artery disease) 08/25/2018   Diabetes mellitus without complication (HCC) 08/15/2018   Thrombocytopenia (HCC) 07/08/2017   Gout, unspecified 06/06/2017   Dysuria 05/25/2017   Microscopic hematuria 05/25/2017   CKD (chronic kidney disease) stage 3, GFR 30-59 ml/min (HCC) 04/12/2017   Neuropathy 02/25/2017   Urinary frequency 09/19/2016    Constipation 11/27/2015   Medicare annual wellness visit, initial 09/23/2015   Advance care planning 09/23/2015   Spinal stenosis of lumbar region 01/26/2015   History of colonic polyps    Restless leg syndrome 08/11/2014   ICD- MDT, implanted 11/13/13 11/14/2013   Chronic systolic CHF (congestive heart failure) (HCC) 11/13/2013   SOB (shortness of breath), mostly on exertion 06/05/2013   Acute on chronic combined systolic and diastolic congestive heart failure, NYHA class 4 (HCC) 05/12/2013   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   Umbilical hernia 01/27/2013   Elevated PSA 12/04/2012   Other malaise and fatigue 12/04/2012   PD (Parkinson's disease) (HCC) 08/19/2012   OSA on CPAP 04/17/2012   Dysautonomia (HCC) 04/17/2012   Obesity, morbid (HCC) 04/17/2012   Hypothyroidism following radioiodine  therapy 10/16/2011   GERD 05/11/2009   Type 2 diabetes mellitus with chronic kidney disease, with long-term current use of insulin  (HCC) 04/26/2009   Back pain 08/18/2008   OTHER SPEC FORMS CHRONIC ISCHEMIC HEART DISEASE 04/24/2008   HYPERPLASIA PROSTATE UNS W/O UR OBST & OTH LUTS 09/02/2007   NEPHROLITHIASIS, HX OF 09/02/2007   Ventral hernia 12/28/2006   Hyperlipidemia 08/31/2006   HTN (hypertension) 08/31/2006   Hx of CABG x 3 in 2000 08/31/2006    ONSET DATE: 08/09/2023 (referral)   REFERRING DIAG: G24.8 (ICD-10-CM) - 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 G20.B2 (ICD-10-CM) - Parkinson's disease with dyskinesia and fluctuating manifestations (HCC)  THERAPY DIAG:  Abnormal posture  History of falling  Muscle weakness (generalized)  Other abnormalities of gait and mobility  Rationale for Evaluation and Treatment: Rehabilitation  SUBJECTIVE:                                                                                                                                                                                             SUBJECTIVE STATEMENT: Marcey   Had a  fall on Friday night - was bending over to get a bag of crackers and lost his balance. Wife and daughter had to help him off the floor. Notes his back has been hurting since then.   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 No Pt reports no pain right now, but feels like it will start hurting when he starts doing things.   PRECAUTIONS: Fall, ICD/Pacemaker, and Other: DBS  RED FLAGS: None   WEIGHT BEARING RESTRICTIONS: No  FALLS: Has patient fallen in last 6 months? Yes. Number of falls 2  LIVING ENVIRONMENT: Lives with: lives with their spouse and Oldest Daughter and her husband Lives in: House/apartment Stairs: Ramped entry  Has following equipment at home: Environmental consultant - 2 wheeled, Environmental consultant - 4 wheeled, Wheelchair (manual), [employer], Grab bars, and U-Step walker   PLOF: Requires assistive device for independence, Needs assistance with ADLs, Needs assistance with gait, and Needs assistance with transfers  PATIENT GOALS: I don't know I just want to keep my strength   OBJECTIVE:  Note: Objective measures were completed at Evaluation unless otherwise noted.  DIAGNOSTIC FINDINGS: None relevant   COGNITION: Overall cognitive status: History of cognitive impairments - at baseline   SENSATION: Pt denied numbness/tingling in BLEs  COORDINATION: Dystonia in LLE    POSTURE: rounded shoulders,  forward head, increased thoracic kyphosis, posterior pelvic tilt, and flexed trunk   LOWER EXTREMITY ROM:     Active  Right Eval Left Eval  Hip flexion    Hip extension    Hip abduction    Hip adduction    Hip internal rotation    Hip external rotation    Knee flexion    Knee extension    Ankle dorsiflexion    Ankle plantarflexion    Ankle inversion    Ankle eversion     (Blank rows = not tested)  LOWER EXTREMITY MMT:    MMT  Right Eval Left Eval  Hip flexion    Hip extension    Hip abduction    Hip adduction    Hip internal rotation    Hip external rotation    Knee flexion    Knee extension    Ankle dorsiflexion    Ankle plantarflexion    Ankle inversion    Ankle eversion    (Blank rows = not tested)  BED MOBILITY:  Not tested Pt frequently sleeps in recliner (lift chair)   TRANSFERS: Sit to stand: CGA  Assistive device utilized: U-Step     Stand to sit: CGA and Min A  Assistive device utilized: U-step      Pt does not fully turn to sit and pushes U-Step away from body, requiring min A to ensure pt did not miss seat or fall forwards when pushing U-Step away. Pt reports he did this because he was fatigued. Pt also demonstrates poor anterior weight shift w/heavy posterior bracing on chair to stand.   RAMP:  Not tested  CURB:  Not tested  STAIRS: Not tested GAIT: Gait pattern: step to pattern, decreased stride length, decreased hip/knee flexion- Right, decreased hip/knee flexion- Left, decreased ankle dorsiflexion- Right, decreased ankle dorsiflexion- Left, shuffling, trunk flexed, wide BOS, poor foot clearance- Right, and poor foot clearance- Left Distance walked: Various short clinic distances  Assistive device utilized: U-Step  Level of assistance: CGA Comments: Pt assumes severe forward-flexed posture w/arms extended on walker. Max verbal and tactile cues provided to facilitate upright posture, which pt able to maintain for brief amount of time prior to resuming forward flexed posture.                                                                                                                                TREATMENT :     Ther Act  NuStep with BLE/BUE at Gear 5.0 for 7 minutes for neural priming for reciprocal movement, dynamic cardiovascular warmup and increased amplitude of stepping. Pt reporting RPE as 5/10  When turning to mat table and Sci-Fit, cued to turn all the way until pt  feels the mat table/chair and pick up feet, and cued to reach back towards surface whenever he is going to sit down instead of having both hands on U-Step. Pt did well not crossing his feet when turning.  When getting up to sit from the mat table, cued to press up from the mat instead of having both hands on U-Step brake handles Pt needing cues throughout session with U-Step rollator to try to stay closer as pt with severe forward flexed posture  Assessed pt's BP after NuStep per pt request as he reports it normally runs low. Pt reporting no lightheadedness/dizziness   Vitals:   09/11/23 1506  BP: 118/66  Pulse: 69    Pt needing to use restroom at end of session, with PT opening the door for pt with pt left in restroom with care of his spouse.    NMR:  Standing at ballet bar: Reaching superior/laterally and grabbing Squigz off mirror for weight shifting, reaching outside of BOS, visual scanning, and upright posture and then passing it laterally to therapist for weight shifting, performed 10 reps each side, when going to reach laterally to PT, pt with incr forward flexed posture and having incr knee flexion, cues to try to stand tall with pt reporting that he is unable to do so, pt needing a seated rest break between each side due to fatigue, pt performing with single UE support on bar With BUE support: Alternating lateral stepping over 2 obstacle 10 reps each side, then performing over 4 obstacle 10 reps each side with pt needing a seated rest break afterwards with RPE as 8/10, pt did well with foot clearance when stepping over obstacles, cues for upright posture throughout as pt with incr forward lean over bar  Pt wishing to review seated deadlifts given as HEP (as he had not performed them since his fall on Friday), performed with 10 reps with 10# kettlebell with good technique and pt reporting no back pain    PATIENT EDUCATION: Education details: Continue HEP  Person educated:  Patient Education method: Explanation, Demonstration, Tactile cues, and Verbal cues Education comprehension: verbalized understanding, returned demonstration, verbal cues required, tactile cues required, and needs further education  HOME EXERCISE PROGRAM: Seated PWR moves   Access Code: [email or web address] URL: [email or web address] Date: 09/05/2023 Prepared by: Marlon Plaster  Program Notes You may perform seated deadlifts with your 5lb kettlebell, 3 sets of 10-15 repetitions   Exercises - Seated Shoulder Horizontal Abduction with Resistance  - 1 x daily - 7 x weekly - 2-3 sets - 10 reps - 3-5 seconds hold  GOALS: Goals reviewed with patient? Yes  SHORT TERM GOALS: Target date: 09/20/2023   Pt will perform floor transfer w/min A from wife for improved fall recovery and safety at home  Baseline: wife reports she can get pt up if he gets into quadruped, but has a hard time with this  Goal status: INITIAL  2.  Pt will improve 5 x STS to less than or equal to 20 seconds with proper body mechanics and BUE support to demonstrate improved functional strength and transfer efficiency.   Baseline: 17.34s w/BUE support and improper body mechanics bracing against the chair  Goal status: INITIAL  3.  TUG to be assessed and LTG updated  Baseline:  Goal status: MET  4.  Pt will improve gait velocity to at least 1.85 ft/s w/U-Step for improved gait efficiency and reduced fall risk   Baseline: 1.64 ft/s w/U-Step  Goal status: INITIAL   LONG TERM GOALS: Target date: 10/04/2023   Pt will perform final HEP w/min A from wife for improved strength, balance, transfers and gait. Baseline:  Goal status: INITIAL  2.  Pt will improve gait velocity to at  least 2.0 ft/s w/U-Step for improved gait efficiency and endurance  Baseline: 1.64 ft/s w/U-Step  Goal status: INITIAL  3.  Pt will improve normal TUG to less than or equal to 17 seconds for improved functional mobility and decreased  fall risk.  Baseline: 20.03s w/U-step and CGA Goal status: REVISED  4.  Pt will verbalize understanding of local PD community resources, including fitness post DC.  Baseline:  Goal status: INITIAL  ASSESSMENT:  CLINICAL IMPRESSION: Today's skilled session focused on working on standing balance tasks at ballet tasks with focus on foot clearance, laterally stepping, and weight shifting/reaching for posture. Pt needing cues throughout for posture, esp when reaching laterally (but pt reports he was unable to) and also had incr knee flexion when performing. Intermittent seated rest breaks needed after standing balance tasks due to fatigue. Pt reports that he had a fall over the weekend and his back was bothersome, but pt did not report any back discomfort during session. Will continue per POC.    OBJECTIVE IMPAIRMENTS: Abnormal gait, decreased activity tolerance, decreased balance, decreased cognition, decreased coordination, decreased endurance, decreased knowledge of use of DME, decreased mobility, difficulty walking, decreased strength, decreased safety awareness, impaired perceived functional ability, increased muscle spasms, improper body mechanics, and postural dysfunction.   ACTIVITY LIMITATIONS: carrying, lifting, sitting, standing, transfers, bed mobility, continence, bathing, dressing, reach over head, hygiene/grooming, locomotion level, and caring for others  PARTICIPATION LIMITATIONS: meal prep, cleaning, laundry, medication management, personal finances, interpersonal relationship, driving, shopping, community activity, and yard work  PERSONAL FACTORS: Behavior pattern, Fitness, Past/current experiences, and 1-2 comorbidities: PD and dysotnia in LLE are also affecting patient's functional outcome.   REHAB POTENTIAL: Fair Due to severity of deficits   CLINICAL DECISION MAKING: Evolving/moderate complexity  EVALUATION COMPLEXITY: Moderate  PLAN:  PT FREQUENCY: 2x/week  PT  DURATION: 6 weeks  PLANNED INTERVENTIONS: 97164- PT Re-evaluation, 97750- Physical Performance Testing, 97110-Therapeutic exercises, 97530- Therapeutic activity, [id number]- Neuromuscular re-education, 97535- Self Care, [id number]- Manual therapy, [id number]- Gait training, [phone]- Electrical stimulation (manual), [phone] (1-2 muscles), [id number] (3+ muscles)- Dry Needling, Patient/Family education, Balance training, Stair training, Joint mobilization, Spinal mobilization, Vestibular training, DME instructions, and Moist heat  PLAN FOR NEXT SESSION: Work on endurance (Scifit, gait trials), upright posture, safety awareness. TURNS. Lifting. Work on standing posture/weight shifting    Sheffield LOISE Senate, PT, DPT 09/11/2023, 4:30 PM

## 2023-09-12 ENCOUNTER — Encounter: Payer: Self-pay | Admitting: Podiatry

## 2023-09-12 ENCOUNTER — Ambulatory Visit (INDEPENDENT_AMBULATORY_CARE_PROVIDER_SITE_OTHER): Admitting: Podiatry

## 2023-09-12 ENCOUNTER — Ambulatory Visit: Admitting: Podiatry

## 2023-09-12 DIAGNOSIS — M79674 Pain in right toe(s): Secondary | ICD-10-CM | POA: Diagnosis not present

## 2023-09-12 DIAGNOSIS — B351 Tinea unguium: Secondary | ICD-10-CM

## 2023-09-12 DIAGNOSIS — M79675 Pain in left toe(s): Secondary | ICD-10-CM | POA: Diagnosis not present

## 2023-09-13 NOTE — Progress Notes (Signed)
 Subjective:   Patient ID: Henry [name], male   DOB: 73 y.o.   MRN: [id number]   HPI Patient presents with painful nailbeds 1-5 both feet that he cannot take care of with patient having long-term Parkinson's disease   ROS      Objective:  Physical Exam  Neurovascular status intact with thick yellow brittle nailbeds 1-5 both feet that he cannot take care of     Assessment:  Chronic mycotic nail infection with pain 1-5 both feet     Plan:  Debridement of painful nailbeds 1-5 both feet no iatrogenic bleeding reappoint routine care

## 2023-09-14 ENCOUNTER — Ambulatory Visit: Admitting: Physical Therapy

## 2023-09-14 VITALS — BP 107/64 | HR 67

## 2023-09-14 DIAGNOSIS — Z9181 History of falling: Secondary | ICD-10-CM | POA: Diagnosis not present

## 2023-09-14 DIAGNOSIS — R293 Abnormal posture: Secondary | ICD-10-CM

## 2023-09-14 DIAGNOSIS — R2689 Other abnormalities of gait and mobility: Secondary | ICD-10-CM | POA: Diagnosis not present

## 2023-09-14 DIAGNOSIS — M6281 Muscle weakness (generalized): Secondary | ICD-10-CM | POA: Diagnosis not present

## 2023-09-14 DIAGNOSIS — R29818 Other symptoms and signs involving the nervous system: Secondary | ICD-10-CM | POA: Diagnosis not present

## 2023-09-14 DIAGNOSIS — R2681 Unsteadiness on feet: Secondary | ICD-10-CM | POA: Diagnosis not present

## 2023-09-14 NOTE — Therapy (Signed)
 OUTPATIENT PHYSICAL THERAPY NEURO TREATMENT   Patient Name: Henry [name] MRN: [id number] DOB:[birth year], 73 y.o., male Today's Date: 09/14/2023   PCP: Cleatus Arlyss RAMAN, MD  REFERRING PROVIDER: Carilyn Prentice BRAVO, MD  END OF SESSION:  PT End of Session - 09/14/23 1407     Visit Number 7    Number of Visits 13    Date for PT Re-Evaluation 10/11/23    Authorization Type Medicare    PT Start Time 1403    PT Stop Time 1423   Pt lethargic, could not speak, not feeling well   PT Time Calculation (min) 20 min    Equipment Utilized During Treatment Gait belt    Activity Tolerance Patient limited by fatigue;Patient limited by lethargy    Behavior During Therapy WFL for tasks assessed/performed            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)   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   Dysrhythmia    a-fib   GERD (gastroesophageal reflux disease)    Glaucoma    right eye   History of kidney stones    multiple   HLD (hyperlipidemia)    HTN (hypertension)    pt denies 08/19/12   Hyperplasia, prostate    Hypothyroidism    MI (myocardial infarction) (HCC)    Dr Pietro 2000, x3vessels bypass   Neuromuscular disorder (HCC)    Parkinson's Disease   OSA (obstructive sleep apnea)    AHI-28,on CPAP, noncompliant with CPAP   Parkinson disease (HCC)    1999   PONV (postoperative nausea and vomiting)    Restless legs    Shortness of breath    Hx: of at all times   UTI (lower urinary tract infection) 09/15/2012   Klebsiella   Ventral hernia    Walker as ambulation aid    also uses wheelchair at home/when  going out   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   BIOPSY  10/14/2018   Procedure: BIOPSY;  Surgeon: Albertus Gordy HERO, MD;  Location: [facility];  Service: Gastroenterology;;   CATARACT EXTRACTION W/ INTRAOCULAR LENS IMPLANT     Hx: of right eye   CATARACT EXTRACTION W/ INTRAOCULAR LENS IMPLANT Left 2018   COLONOSCOPY N/A 10/13/2014   Procedure: COLONOSCOPY;  Surgeon: Gordy HERO Albertus, MD;  Location: [facility];  Service: Gastroenterology;  Laterality: N/A;   COLONOSCOPY W/ BIOPSIES AND POLYPECTOMY     Hx: of   COLONOSCOPY WITH PROPOFOL  N/A 10/14/2018   Procedure: COLONOSCOPY WITH PROPOFOL ;  Surgeon: Albertus Gordy HERO, MD;  Location: [facility];  Service: Gastroenterology;  Laterality: N/A;   CORONARY ARTERY BYPASS GRAFT  2000   Sudie Laine, MD   CORONARY STENT PLACEMENT  1998   DEEP BRAIN STIMULATOR PLACEMENT  2004   Right and left VIN stimulator placement (parkinsons)   EYE SURGERY     Cataract removed in 1980s related to wood in  eye when 73 years old   FINGER AMPUTATION     left pointer   IMPLANTABLE CARDIOVERTER DEFIBRILLATOR IMPLANT N/A 11/13/2013   Procedure: IMPLANTABLE CARDIOVERTER DEFIBRILLATOR IMPLANT;  Surgeon: Danelle LELON Birmingham, MD;  Location: [facility] CATH LAB;  Service: Cardiovascular;  Laterality: N/A;   INSERT / REPLACE / REMOVE PACEMAKER     medtronic   LEFT AND RIGHT HEART CATHETERIZATION WITH CORONARY ANGIOGRAM N/A 09/24/2013   Procedure: LEFT AND RIGHT HEART CATHETERIZATION WITH CORONARY ANGIOGRAM;  Surgeon: Lonni JONETTA Cash, MD;  Location: [facility] CATH LAB;  Service: Cardiovascular;  Laterality: N/A;   LITHOTRIPSY     3 different times   POLYPECTOMY  10/14/2018   Procedure: POLYPECTOMY;  Surgeon: Albertus Gordy HERO, MD;  Location: [facility];  Service: Gastroenterology;;   PULSE GENERATOR IMPLANT Right 11/13/2017   Procedure: Right chest implantable pulse generator change;  Surgeon: Unice Pac, MD;   Location: [facility] OR;  Service: Neurosurgery;  Laterality: Right;  Right chest implantable pulse generator change   SUBTHALAMIC STIMULATOR BATTERY REPLACEMENT N/A 09/05/2012   Procedure: Deep brain stimulator battery change;  Surgeon: Pac Unice, MD;  Location: [facility];  Service: Neurosurgery;  Laterality: N/A;  Deep brain stimulator battery change   SUBTHALAMIC STIMULATOR BATTERY REPLACEMENT N/A 06/10/2015   Procedure: Deep Brain stimulator battery change;  Surgeon: Pac Unice, MD;  Location: [facility];  Service: Neurosurgery;  Laterality: N/A;   SUBTHALAMIC STIMULATOR BATTERY REPLACEMENT Right 09/25/2019   Procedure: Deep brain stimulator battery change;  Surgeon: Unice Pac, MD;  Location: [facility] OR;  Service: Neurosurgery;  Laterality: Right;   SUBTHALAMIC STIMULATOR BATTERY REPLACEMENT Right 12/15/2022   Procedure: IMPLANTABLE PULSE GENERATOR  BATTERY REPLACEMENT;  Surgeon: Carollee Lani BROCKS, DO;  Location: [facility] OR;  Service: Neurosurgery;  Laterality: Right;  3C   TONSILLECTOMY     Patient Active Problem List   Diagnosis Date Noted   Skin tear of hand without complication 08/03/2023   Delirium 05/29/2023   Healthcare maintenance 04/08/2023   Cerumen impaction 04/08/2023   Hordeolum 03/21/2023   Pancreatic cyst 03/05/2022   Lower respiratory infection 02/20/2022   Lobar pneumonia (HCC) 02/20/2022   Sore throat 01/12/2022   Cyst of perianal area 01/12/2022   Strep pharyngitis 12/16/2021   Wheezing 12/16/2021   DOE (dyspnea on exertion) 12/16/2021   Parkinson's disease with EBS (electrical brain stimulation) [G20.A1] 12/05/2021   Fall at home 06/15/2021   Asthma    Skin lesion 05/30/2019   CAD (coronary artery disease) 08/25/2018   Diabetes mellitus without complication (HCC) 08/15/2018   Thrombocytopenia (HCC) 07/08/2017   Gout, unspecified 06/06/2017   Dysuria 05/25/2017   Microscopic hematuria 05/25/2017   CKD (chronic kidney disease) stage 3, GFR 30-59 ml/min (HCC)  04/12/2017   Neuropathy 02/25/2017   Urinary frequency 09/19/2016   Constipation 11/27/2015   Medicare annual wellness visit, initial 09/23/2015   Advance care planning 09/23/2015   Spinal stenosis of lumbar region 01/26/2015   History of colonic polyps    Restless leg syndrome 08/11/2014   ICD- MDT, implanted 11/13/13 11/14/2013   Chronic systolic CHF (congestive heart failure) (HCC) 11/13/2013   SOB (shortness of breath), mostly on exertion 06/05/2013   Acute on chronic combined systolic and diastolic congestive heart failure, NYHA class 4 (HCC) 05/12/2013   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   Umbilical hernia 01/27/2013   Elevated PSA 12/04/2012   Other malaise and fatigue 12/04/2012   PD (Parkinson's disease) (HCC) 08/19/2012   OSA on CPAP 04/17/2012   Dysautonomia (  HCC) 04/17/2012   Obesity, morbid (HCC) 04/17/2012   Hypothyroidism following radioiodine therapy 10/16/2011   GERD 05/11/2009   Type 2 diabetes mellitus with chronic kidney disease, with long-term current use of insulin  (HCC) 04/26/2009   Back pain 08/18/2008   OTHER SPEC FORMS CHRONIC ISCHEMIC HEART DISEASE 04/24/2008   HYPERPLASIA PROSTATE UNS W/O UR OBST & OTH LUTS 09/02/2007   NEPHROLITHIASIS, HX OF 09/02/2007   Ventral hernia 12/28/2006   Hyperlipidemia 08/31/2006   HTN (hypertension) 08/31/2006   Hx of CABG x 3 in 2000 08/31/2006    ONSET DATE: 08/09/2023 (referral)   REFERRING DIAG: G24.8 (ICD-10-CM) - 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 Did not get his bottom in the seat and slid out onto the concrete. Wife tried to catch him but ended up falling as well. Had to call some family to help get them up. Pt not feeling well today, has not been sleeping and has tardive dyskinesia today. I cannot talk   *pt is deaf in R ear and has dysphagia    Pt accompanied by: Wife, [name]   PERTINENT HISTORY: AICD present, CAD, deaf in R ear, DM II, GERD, MI, PD (1999), DBS   PAIN:  Are you having pain? No Pt reports no pain right now, but feels like it will start hurting when he starts doing things.   PRECAUTIONS: Fall, ICD/Pacemaker, and Other: DBS  RED FLAGS: None   WEIGHT BEARING RESTRICTIONS: No  FALLS: Has patient fallen in last 6 months? Yes. Number of falls 2  LIVING ENVIRONMENT: Lives with: lives with their spouse and Oldest Daughter and her husband Lives in: House/apartment Stairs: Ramped entry  Has following equipment at home: Environmental consultant - 2 wheeled, Environmental consultant - 4 wheeled, Wheelchair (manual), [employer], Grab bars, and U-Step walker   PLOF: Requires assistive device for independence, Needs assistance with ADLs, Needs assistance with gait, and Needs assistance with transfers  PATIENT GOALS: I don't know I just want to keep my strength   OBJECTIVE:  Note: Objective measures were completed at Evaluation unless otherwise noted.  DIAGNOSTIC FINDINGS: None relevant   COGNITION: Overall cognitive status: History of cognitive impairments - at baseline   SENSATION: Pt denied numbness/tingling in BLEs  COORDINATION: Dystonia in LLE    POSTURE: rounded shoulders, forward head, increased thoracic kyphosis, posterior pelvic tilt, and flexed trunk   LOWER EXTREMITY ROM:     Active  Right Eval Left Eval  Hip flexion    Hip extension    Hip abduction    Hip adduction    Hip internal rotation    Hip external  rotation    Knee flexion    Knee extension    Ankle dorsiflexion    Ankle plantarflexion    Ankle inversion    Ankle eversion     (Blank rows = not tested)  LOWER EXTREMITY MMT:    MMT Right Eval Left Eval  Hip flexion    Hip extension    Hip abduction    Hip adduction    Hip internal rotation    Hip external rotation    Knee flexion    Knee extension    Ankle dorsiflexion    Ankle plantarflexion    Ankle inversion    Ankle eversion    (Blank rows = not tested)  BED MOBILITY:  Not tested Pt frequently sleeps in recliner (lift chair)   TRANSFERS: Sit to stand: CGA  Assistive device utilized: U-Step     Stand to sit: CGA and Min A  Assistive device utilized: U-step      Pt does not fully turn to sit and pushes U-Step away from body, requiring min A to ensure pt did not miss seat or fall forwards when pushing U-Step away. Pt reports he did this because he was fatigued. Pt also demonstrates poor anterior weight shift w/heavy posterior bracing on chair to stand.   RAMP:  Not tested  CURB:  Not tested  STAIRS: Not tested GAIT: Gait pattern: step to pattern, decreased stride length, decreased hip/knee flexion- Right, decreased hip/knee flexion- Left, decreased ankle dorsiflexion- Right, decreased ankle dorsiflexion- Left, shuffling, trunk flexed, wide BOS, poor foot clearance- Right, and poor foot clearance- Left Distance walked: Various short clinic distances  Assistive device utilized: U-Step  Level of assistance: CGA Comments: Pt assumes severe forward-flexed posture w/arms extended on walker. Max verbal and tactile cues provided to facilitate upright posture, which pt able to maintain for brief amount of time prior to resuming forward flexed posture.     VITALS  Vitals:   09/14/23 1411  BP: 107/64  Pulse: 67                                                                                                                              TREATMENT :    Self-care/home management/Ther Act   Assessed vitals (see above) and WNL  Escorted pt out to the car to work on car transfers and pt impulsively stopped and tried to sit on ground prior to reaching  car, requiring max A to fully turn and sit onto U-Step to prevent fall. Pt wheezy and did not answer therapist's questions. Wife reports pt has not been doing well for a few days. While pt resting, demonstrated proper car transfer technique for pt and emphasized importance of placing bottom into vehicle first (pt has been placing LLE in first). Wife reports she ordered a handle to fit in the doorframe to assist as well. After several minutes of seated rest, pt was able to practice car transfer w/CGA. Pt did not use U-step to get close to car, relies on holding onto door, so will address this in future session as pt not receptive to feedback today. Pt able to fully turn and sit in car w/CGA and swing legs in slowly. Pt again wheezing to breathe, wife reports this is normal for pt. Pt in agreement to end PT session early due to lethargy and reports he wants to go lay down. Reminded pt and wife of next appointment date and time.   PATIENT EDUCATION: Education details: Continue HEP, proper car transfer technique  Person educated: Patient Education method: Explanation, Demonstration, Tactile cues, and Verbal cues Education comprehension: verbalized understanding, returned demonstration, verbal cues required, tactile cues required, and needs further education  HOME EXERCISE PROGRAM: Seated PWR moves   Access Code: [email or web address] URL: [email or web address] Date: 09/05/2023 Prepared by: Marlon Coni Homesley  Program Notes You may perform seated deadlifts with your 5lb kettlebell, 3 sets of 10-15 repetitions   Exercises - Seated Shoulder Horizontal Abduction with Resistance  - 1 x daily - 7 x weekly - 2-3 sets - 10 reps - 3-5 seconds hold  GOALS: Goals reviewed with patient? Yes  SHORT TERM GOALS:  Target date: 09/20/2023   Pt will perform floor transfer w/min A from wife for improved fall recovery and safety at home  Baseline: wife reports she can get pt up if he gets into quadruped, but has a hard time with this  Goal status: INITIAL  2.  Pt will improve 5 x STS to less than or equal to 20 seconds with proper body mechanics and BUE support to demonstrate improved functional strength and transfer efficiency.   Baseline: 17.34s w/BUE support and improper body mechanics bracing against the chair  Goal status: INITIAL  3.  TUG to be assessed and LTG updated  Baseline:  Goal status: MET  4.  Pt will improve gait velocity to at least 1.85 ft/s w/U-Step for improved gait efficiency and reduced fall risk   Baseline: 1.64 ft/s w/U-Step  Goal status: INITIAL   LONG TERM GOALS: Target date: 10/04/2023   Pt will perform final HEP w/min A from wife for improved strength, balance, transfers and gait. Baseline:  Goal status: INITIAL  2.  Pt will improve gait velocity to at least 2.0 ft/s w/U-Step for improved gait efficiency and endurance  Baseline: 1.64 ft/s w/U-Step  Goal status: INITIAL  3.  Pt will improve normal TUG to less than or equal to 17 seconds for improved functional mobility and decreased fall risk.  Baseline: 20.03s w/U-step and CGA Goal status: REVISED  4.  Pt will verbalize understanding of local PD community resources, including fitness post DC.  Baseline:  Goal status: INITIAL  ASSESSMENT:  CLINICAL IMPRESSION: Session limited as pt not feeling well and had tardive dyskinesia w/inability to speak. Pt has had another fall since last session, this time while getting into his car and his wife fell with him while trying to prevent fall. Pt required  max A while ambulating to car to prevent fall to ground due to fatigue, not announcing his need to sit prior to attempting. For safety, recommended pt go home and rest rather than continue PT session, which pt and wife  in agreement with. Pt able to demonstrate proper car transfer technique but would be safer w/use of U-step rather than holding onto car door, so will work on this in future sessions. Continue POC.    OBJECTIVE IMPAIRMENTS: Abnormal gait, decreased activity tolerance, decreased balance, decreased cognition, decreased coordination, decreased endurance, decreased knowledge of use of DME, decreased mobility, difficulty walking, decreased strength, decreased safety awareness, impaired perceived functional ability, increased muscle spasms, improper body mechanics, and postural dysfunction.   ACTIVITY LIMITATIONS: carrying, lifting, sitting, standing, transfers, bed mobility, continence, bathing, dressing, reach over head, hygiene/grooming, locomotion level, and caring for others  PARTICIPATION LIMITATIONS: meal prep, cleaning, laundry, medication management, personal finances, interpersonal relationship, driving, shopping, community activity, and yard work  PERSONAL FACTORS: Behavior pattern, Fitness, Past/current experiences, and 1-2 comorbidities: PD and dysotnia in LLE are also affecting patient's functional outcome.   REHAB POTENTIAL: Fair Due to severity of deficits   CLINICAL DECISION MAKING: Evolving/moderate complexity  EVALUATION COMPLEXITY: Moderate  PLAN:  PT FREQUENCY: 2x/week  PT DURATION: 6 weeks  PLANNED INTERVENTIONS: 97164- PT Re-evaluation, 97750- Physical Performance Testing, 97110-Therapeutic exercises, 97530- Therapeutic activity, [id number]- Neuromuscular re-education, 97535- Self Care, [id number]- Manual therapy, [id number]- Gait training, [phone]- Electrical stimulation (manual), [phone] (1-2 muscles), [id number] (3+ muscles)- Dry Needling, Patient/Family education, Balance training, Stair training, Joint mobilization, Spinal mobilization, Vestibular training, DME instructions, and Moist heat  PLAN FOR NEXT SESSION: Work on endurance (Scifit, gait trials), upright posture, safety awareness. TURNS.  Lifting. Work on standing posture/weight shifting, car transfers    Eugina Row E Carlie Corpus, PT, DPT 09/14/2023, 2:31 PM

## 2023-09-17 ENCOUNTER — Other Ambulatory Visit: Payer: Self-pay | Admitting: Neurology

## 2023-09-17 ENCOUNTER — Ambulatory Visit: Admitting: Physical Therapy

## 2023-09-17 DIAGNOSIS — R29818 Other symptoms and signs involving the nervous system: Secondary | ICD-10-CM | POA: Diagnosis not present

## 2023-09-17 DIAGNOSIS — R443 Hallucinations, unspecified: Secondary | ICD-10-CM

## 2023-09-17 DIAGNOSIS — M6281 Muscle weakness (generalized): Secondary | ICD-10-CM

## 2023-09-17 DIAGNOSIS — G20A1 Parkinson's disease without dyskinesia, without mention of fluctuations: Secondary | ICD-10-CM

## 2023-09-17 DIAGNOSIS — Z9181 History of falling: Secondary | ICD-10-CM | POA: Diagnosis not present

## 2023-09-17 DIAGNOSIS — R293 Abnormal posture: Secondary | ICD-10-CM | POA: Diagnosis not present

## 2023-09-17 DIAGNOSIS — R2689 Other abnormalities of gait and mobility: Secondary | ICD-10-CM | POA: Diagnosis not present

## 2023-09-17 DIAGNOSIS — R2681 Unsteadiness on feet: Secondary | ICD-10-CM | POA: Diagnosis not present

## 2023-09-17 NOTE — Therapy (Signed)
 OUTPATIENT PHYSICAL THERAPY NEURO TREATMENT   Patient Name: Henry [name] MRN: [id number] DOB:April 10, 1950, 73 y.o., male Today's Date: 09/17/2023   PCP: Cleatus Arlyss RAMAN, MD  REFERRING PROVIDER: Carilyn Prentice BRAVO, MD  END OF SESSION:  PT End of Session - 09/17/23 1235     Visit Number 8    Number of Visits 13    Date for PT Re-Evaluation 10/11/23    Authorization Type Medicare    PT Start Time 1232    PT Stop Time 1316    PT Time Calculation (min) 44 min    Equipment Utilized During Treatment Gait belt    Activity Tolerance Patient tolerated treatment well;Patient limited by pain    Behavior During Therapy WFL for tasks assessed/performed            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)   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   Dysrhythmia    a-fib   GERD (gastroesophageal reflux disease)    Glaucoma    right eye   History of kidney stones    multiple   HLD (hyperlipidemia)    HTN (hypertension)    pt denies 08/19/12   Hyperplasia, prostate    Hypothyroidism    MI (myocardial infarction) (HCC)    Dr Pietro 2000, x3vessels bypass   Neuromuscular disorder (HCC)    Parkinson's Disease   OSA (obstructive sleep apnea)    AHI-28,on CPAP, noncompliant with CPAP   Parkinson disease (HCC)    1999   PONV (postoperative nausea and vomiting)    Restless legs    Shortness of breath    Hx: of at all times   UTI (lower urinary tract infection) 09/15/2012   Klebsiella   Ventral hernia    Walker as ambulation aid    also uses wheelchair at home/when going out   Past Surgical History:  Procedure  Laterality Date   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   BIOPSY  10/14/2018   Procedure: BIOPSY;  Surgeon: Albertus Gordy HERO, MD;  Location: [facility];  Service: Gastroenterology;;   CATARACT EXTRACTION W/ INTRAOCULAR LENS IMPLANT     Hx: of right eye   CATARACT EXTRACTION W/ INTRAOCULAR LENS IMPLANT Left 2018   COLONOSCOPY N/A 10/13/2014   Procedure: COLONOSCOPY;  Surgeon: Gordy HERO Albertus, MD;  Location: [facility];  Service: Gastroenterology;  Laterality: N/A;   COLONOSCOPY W/ BIOPSIES AND POLYPECTOMY     Hx: of   COLONOSCOPY WITH PROPOFOL  N/A 10/14/2018   Procedure: COLONOSCOPY WITH PROPOFOL ;  Surgeon: Albertus Gordy HERO, MD;  Location: [facility];  Service: Gastroenterology;  Laterality: N/A;   CORONARY ARTERY BYPASS GRAFT  2000   Sudie Laine, MD   CORONARY STENT PLACEMENT  1998   DEEP BRAIN STIMULATOR PLACEMENT  2004   Right and left VIN stimulator placement (parkinsons)   EYE SURGERY     Cataract removed in 1980s related to wood in eye when 73 years old   FINGER AMPUTATION  left pointer   IMPLANTABLE CARDIOVERTER DEFIBRILLATOR IMPLANT N/A 11/13/2013   Procedure: IMPLANTABLE CARDIOVERTER DEFIBRILLATOR IMPLANT;  Surgeon: Danelle LELON Birmingham, MD;  Location: [facility];  Service: Cardiovascular;  Laterality: N/A;   INSERT / REPLACE / REMOVE PACEMAKER     medtronic   LEFT AND RIGHT HEART CATHETERIZATION WITH CORONARY ANGIOGRAM N/A 09/24/2013   Procedure: LEFT AND RIGHT HEART CATHETERIZATION WITH CORONARY ANGIOGRAM;  Surgeon: Lonni JONETTA Cash, MD;  Location: [facility] CATH LAB;  Service: Cardiovascular;  Laterality: N/A;   LITHOTRIPSY     3 different times   POLYPECTOMY  10/14/2018   Procedure: POLYPECTOMY;  Surgeon: Albertus Gordy HERO, MD;  Location: [facility];  Service: Gastroenterology;;   PULSE GENERATOR IMPLANT Right 11/13/2017   Procedure: Right chest implantable pulse generator change;  Surgeon: Unice Pac, MD;  Location: [facility] OR;  Service: Neurosurgery;  Laterality:  Right;  Right chest implantable pulse generator change   SUBTHALAMIC STIMULATOR BATTERY REPLACEMENT N/A 09/05/2012   Procedure: Deep brain stimulator battery change;  Surgeon: Pac Unice, MD;  Location: [facility]URO [facility]S;  Service: Neurosurgery;  Laterality: N/A;  Deep brain stimulator battery change   SUBTHALAMIC STIMULATOR BATTERY REPLACEMENT N/A 06/10/2015   Procedure: Deep Brain stimulator battery change;  Surgeon: Pac Unice, MD;  Location: [facility];  Service: Neurosurgery;  Laterality: N/A;   SUBTHALAMIC STIMULATOR BATTERY REPLACEMENT Right 09/25/2019   Procedure: Deep brain stimulator battery change;  Surgeon: Unice Pac, MD;  Location[facility] OR;  Service: Neurosurgery;  Laterality: Right;   SUBTHALAMIC STIMULATOR BATTERY REPLACEMENT Right 12/15/2022   Procedure: IMPLANTABLE PULSE GENERATOR  BATTERY REPLACEMENT;  Surgeon: Carollee Lani BROCKS, DO;  Location: [facility] OR;  Service: Neurosurgery;  Laterality: Right;  3C   TONSILLECTOMY     Patient Active Problem List   Diagnosis Date Noted   Skin tear of hand without complication 08/03/2023   Delirium 05/29/2023   Healthcare maintenance 04/08/2023   Cerumen impaction 04/08/2023   Hordeolum 03/21/2023   Pancreatic cyst 03/05/2022   Lower respiratory infection 02/20/2022   Lobar pneumonia (HCC) 02/20/2022   Sore throat 01/12/2022   Cyst of perianal area 01/12/2022   Strep pharyngitis 12/16/2021   Wheezing 12/16/2021   DOE (dyspnea on exertion) 12/16/2021   Parkinson's disease with EBS (electrical brain stimulation) [G20.A1] 12/05/2021   Fall at home 06/15/2021   Asthma    Skin lesion 05/30/2019   CAD (coronary artery disease) 08/25/2018   Diabetes mellitus without complication (HCC) 08/15/2018   Thrombocytopenia (HCC) 07/08/2017   Gout, unspecified 06/06/2017   Dysuria 05/25/2017   Microscopic hematuria 05/25/2017   CKD (chronic kidney disease) stage 3, GFR 30-59 ml/min (HCC) 04/12/2017   Neuropathy 02/25/2017   Urinary frequency  09/19/2016   Constipation 11/27/2015   Medicare annual wellness visit, initial 09/23/2015   Advance care planning 09/23/2015   Spinal stenosis of lumbar region 01/26/2015   History of colonic polyps    Restless leg syndrome 08/11/2014   ICD- MDT, implanted 11/13/13 11/14/2013   Chronic systolic CHF (congestive heart failure) (HCC) 11/13/2013   SOB (shortness of breath), mostly on exertion 06/05/2013   Acute on chronic combined systolic and diastolic congestive heart failure, NYHA class 4 (HCC) 05/12/2013   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   Umbilical hernia 01/27/2013   Elevated PSA 12/04/2012   Other malaise and fatigue 12/04/2012   PD (Parkinson's disease) (HCC) 08/19/2012   OSA on CPAP 04/17/2012   Dysautonomia (HCC) 04/17/2012   Obesity, morbid (HCC) 04/17/2012   Hypothyroidism following radioiodine  therapy 10/16/2011   GERD 05/11/2009   Type 2 diabetes mellitus with chronic kidney disease, with long-term current use of insulin  (HCC) 04/26/2009   Back pain 08/18/2008   OTHER SPEC FORMS CHRONIC ISCHEMIC HEART DISEASE 04/24/2008   HYPERPLASIA PROSTATE UNS W/O UR OBST & OTH LUTS 09/02/2007   NEPHROLITHIASIS, HX OF 09/02/2007   Ventral hernia 12/28/2006   Hyperlipidemia 08/31/2006   HTN (hypertension) 08/31/2006   Hx of CABG x 3 in 2000 08/31/2006    ONSET DATE: 08/09/2023 (referral)   REFERRING DIAG: G24.8 (ICD-10-CM) - 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 Reports he feels better today, but has had significant neck pain ever since his falls last week. States it keeps him up at night. Denies falls since last visit. Has not been working on his HEP.   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 Yes: NPRS scale: 8/10 Pain location: Neck Pain description: Achy, crick   PRECAUTIONS: Fall, ICD/Pacemaker, and Other: DBS  RED FLAGS: None   WEIGHT BEARING RESTRICTIONS: No  FALLS: Has patient fallen in last 6 months? Yes. Number of falls 2  LIVING ENVIRONMENT: Lives with: lives with their spouse and Oldest Daughter and her husband Lives in: House/apartment Stairs: Ramped entry  Has following equipment at home: Environmental consultant - 2 wheeled, Environmental consultant - 4 wheeled, Wheelchair (manual), [employer], Grab bars, and U-Step walker   PLOF: Requires assistive device for independence, Needs assistance with ADLs, Needs assistance with gait, and Needs assistance with transfers  PATIENT GOALS: I don't know I just want to keep my strength   OBJECTIVE:  Note: Objective measures were completed at Evaluation unless otherwise noted.  DIAGNOSTIC FINDINGS: None relevant   COGNITION: Overall cognitive status: History of cognitive impairments - at baseline   SENSATION: Pt denied numbness/tingling in BLEs  COORDINATION: Dystonia in LLE    POSTURE: rounded shoulders, forward head, increased thoracic kyphosis, posterior  pelvic tilt, and flexed trunk   LOWER EXTREMITY ROM:     Active  Right Eval Left Eval  Hip flexion    Hip extension    Hip abduction    Hip adduction    Hip internal rotation    Hip external rotation    Knee flexion    Knee extension    Ankle dorsiflexion    Ankle plantarflexion    Ankle inversion    Ankle eversion     (Blank rows = not tested)  LOWER  EXTREMITY MMT:    MMT Right Eval Left Eval  Hip flexion    Hip extension    Hip abduction    Hip adduction    Hip internal rotation    Hip external rotation    Knee flexion    Knee extension    Ankle dorsiflexion    Ankle plantarflexion    Ankle inversion    Ankle eversion    (Blank rows = not tested)  BED MOBILITY:  Not tested Pt frequently sleeps in recliner (lift chair)   TRANSFERS: Sit to stand: CGA  Assistive device utilized: U-Step     Stand to sit: CGA and Min A  Assistive device utilized: U-step      Pt does not fully turn to sit and pushes U-Step away from body, requiring min A to ensure pt did not miss seat or fall forwards when pushing U-Step away. Pt reports he did this because he was fatigued. Pt also demonstrates poor anterior weight shift w/heavy posterior bracing on chair to stand.   RAMP:  Not tested  CURB:  Not tested  STAIRS: Not tested GAIT: Gait pattern: step to pattern, decreased stride length, decreased hip/knee flexion- Right, decreased hip/knee flexion- Left, decreased ankle dorsiflexion- Right, decreased ankle dorsiflexion- Left, shuffling, trunk flexed, wide BOS, poor foot clearance- Right, and poor foot clearance- Left Distance walked: Various short clinic distances  Assistive device utilized: U-Step  Level of assistance: CGA Comments: Pt assumes severe forward-flexed posture w/arms extended on walker. Max verbal and tactile cues provided to facilitate upright posture, which pt able to maintain for brief amount of time prior to resuming forward flexed posture.     VITALS  There were no vitals filed for this visit.                                                                                                                             TREATMENT :   Manual therapy  Provided trigger point release in seated position to R upper trap, R sub occipitals and levator scapulae for pain modulation. Used lacrosse ball for added pressure.  Manual R  upper trap stretch, 3x45s holds. Very limited mobility   NMR  At mat table, placed bean bags on mat and had pt stand w/back towards mat and reach posterolaterally for bags and toss into basket x2 rounds for improved stability reaching out of BOS, postural control and anticipatory balance  strategies. Min A throughout as pt performed w/severe forward flexed posture despite cues for upright posture. Pt w/increased forward flexed posture w/fatigue. Increased difficulty reaching to R side > L side   Ther Act  SciFit multi-peaks level 9.5 for 8 minutes using BUE/BLEs for neural priming for reciprocal movement, dynamic cardiovascular conditioning and improved BUE ROM. Pt rated pain as 0/10 following session.    PATIENT EDUCATION: Education details: Continue HEP  Person educated: Patient Education method: Explanation, Demonstration, Tactile cues, and Verbal cues Education comprehension: verbalized understanding, returned demonstration, verbal cues required, tactile cues required, and needs further education  HOME EXERCISE PROGRAM: Seated PWR moves   Access Code: [email or web address] URL: [email or web address] Date: 09/05/2023 Prepared by: Marlon Jeymi Hepp  Program Notes You may perform seated deadlifts with your 5lb kettlebell, 3 sets of 10-15 repetitions   Exercises - Seated Shoulder Horizontal Abduction with Resistance  - 1 x daily - 7 x weekly - 2-3 sets - 10 reps - 3-5 seconds hold  GOALS: Goals reviewed with patient? Yes  SHORT TERM GOALS: Target date: 09/20/2023   Pt will perform floor transfer w/min A from wife for improved fall recovery and safety at home  Baseline: wife reports she can get pt up if he gets into quadruped, but has a hard time with this  Goal status: INITIAL  2.  Pt will improve 5 x STS to less than or equal to 20 seconds with proper body mechanics and BUE support to demonstrate improved functional strength and transfer efficiency.   Baseline: 17.34s w/BUE  support and improper body mechanics bracing against the chair  Goal status: INITIAL  3.  TUG to be assessed and LTG updated  Baseline:  Goal status: MET  4.  Pt will improve gait velocity to at least 1.85 ft/s w/U-Step for improved gait efficiency and reduced fall risk   Baseline: 1.64 ft/s w/U-Step  Goal status: INITIAL   LONG TERM GOALS: Target date: 10/04/2023   Pt will perform final HEP w/min A from wife for improved strength, balance, transfers and gait. Baseline:  Goal status: INITIAL  2.  Pt will improve gait velocity to at least 2.0 ft/s w/U-Step for improved gait efficiency and endurance  Baseline: 1.64 ft/s w/U-Step  Goal status: INITIAL  3.  Pt will improve normal TUG to less than or equal to 17 seconds for improved functional mobility and decreased fall risk.  Baseline: 20.03s w/U-step and CGA Goal status: REVISED  4.  Pt will verbalize understanding of local PD community resources, including fitness post DC.  Baseline:  Goal status: INITIAL  ASSESSMENT:  CLINICAL IMPRESSION: Emphasis of skilled PT session on manual therapy to pt's R upper traps and sub occipitals, improved stability when reaching out of BOS and endurance. Pt reports he felt off last session and is more with it today, but reporting significant pain in neck following multiple falls last week. Pt w/palpable trigger point in R sub-occipitals and upper trap, which did release some by end of session. Pt w/increased difficulty reaching to R side vs L and is limited by significant forward flexed posture. Pt reported 0/10 pain by end of session and reported feeling good stretch on SciFit. Continue POC.    OBJECTIVE IMPAIRMENTS: Abnormal gait, decreased activity tolerance, decreased balance, decreased cognition, decreased coordination, decreased endurance, decreased knowledge of use of DME, decreased mobility, difficulty walking, decreased strength, decreased safety awareness, impaired perceived  functional ability, increased muscle spasms, improper body mechanics, and postural dysfunction.   ACTIVITY LIMITATIONS: carrying, lifting,  sitting, standing, transfers, bed mobility, continence, bathing, dressing, reach over head, hygiene/grooming, locomotion level, and caring for others  PARTICIPATION LIMITATIONS: meal prep, cleaning, laundry, medication management, personal finances, interpersonal relationship, driving, shopping, community activity, and yard work  PERSONAL FACTORS: Behavior pattern, Fitness, Past/current experiences, and 1-2 comorbidities: PD and dysotnia in LLE are also affecting patient's functional outcome.   REHAB POTENTIAL: Fair Due to severity of deficits   CLINICAL DECISION MAKING: Evolving/moderate complexity  EVALUATION COMPLEXITY: Moderate  PLAN:  PT FREQUENCY: 2x/week  PT DURATION: 6 weeks  PLANNED INTERVENTIONS: 97164- PT Re-evaluation, 97750- Physical Performance Testing, 97110-Therapeutic exercises, 97530- Therapeutic activity, [id number]- Neuromuscular re-education, 97535- Self Care, [id number]- Manual therapy, [id number]- Gait training, [phone]- Electrical stimulation (manual), [phone] (1-2 muscles), [id number] (3+ muscles)- Dry Needling, Patient/Family education, Balance training, Stair training, Joint mobilization, Spinal mobilization, Vestibular training, DME instructions, and Moist heat  PLAN FOR NEXT SESSION: Work on endurance (Scifit, gait trials), upright posture, safety awareness. TURNS. Lifting. Work on standing posture/weight shifting, car transfers, reaching out of BOS    Omarr Hann E Zariel Capano, PT, DPT 09/17/2023, 1:17 PM

## 2023-09-19 ENCOUNTER — Encounter: Payer: Self-pay | Admitting: Physical Therapy

## 2023-09-19 ENCOUNTER — Ambulatory Visit: Admitting: Physical Therapy

## 2023-09-19 DIAGNOSIS — R2689 Other abnormalities of gait and mobility: Secondary | ICD-10-CM

## 2023-09-19 DIAGNOSIS — R29818 Other symptoms and signs involving the nervous system: Secondary | ICD-10-CM | POA: Diagnosis not present

## 2023-09-19 DIAGNOSIS — M6281 Muscle weakness (generalized): Secondary | ICD-10-CM | POA: Diagnosis not present

## 2023-09-19 DIAGNOSIS — R293 Abnormal posture: Secondary | ICD-10-CM

## 2023-09-19 DIAGNOSIS — Z9181 History of falling: Secondary | ICD-10-CM | POA: Diagnosis not present

## 2023-09-19 DIAGNOSIS — R2681 Unsteadiness on feet: Secondary | ICD-10-CM | POA: Diagnosis not present

## 2023-09-19 NOTE — Therapy (Signed)
 OUTPATIENT PHYSICAL THERAPY NEURO TREATMENT/10th VISIT PN   Patient Name: Henry [name] MRN: [id number] DOB:[birth year], 73 y.o., male Today's Date: 09/19/2023   PCP: Cleatus Arlyss RAMAN, MD  REFERRING PROVIDER: Carilyn Prentice BRAVO, MD  10th Visit Physical Therapy Progress Note  Dates of Reporting Period: 08/23/23 to 09/19/23      END OF SESSION:  PT End of Session - 09/19/23 1536     Visit Number 9    Number of Visits 13    Date for PT Re-Evaluation 10/11/23    Authorization Type Medicare    Progress Note Due on Visit --   performed on visit 9   PT Start Time 1533    PT Stop Time 1613    PT Time Calculation (min) 40 min    Equipment Utilized During Treatment Gait belt    Activity Tolerance Patient tolerated treatment well    Behavior During Therapy WFL for tasks assessed/performed            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)   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   Dysrhythmia    a-fib   GERD (gastroesophageal reflux disease)    Glaucoma    right eye   History of kidney stones    multiple   HLD (hyperlipidemia)    HTN (hypertension)    pt denies 08/19/12   Hyperplasia, prostate    Hypothyroidism    MI (myocardial infarction) (HCC)    Dr Pietro 2000, x3vessels bypass   Neuromuscular disorder (HCC)    Parkinson's Disease   OSA (obstructive sleep apnea)    AHI-28,on CPAP, noncompliant with CPAP   Parkinson disease (HCC)    1999   PONV (postoperative nausea and vomiting)    Restless legs    Shortness of breath    Hx: of at all times   UTI (lower urinary tract infection) 09/15/2012    Klebsiella   Ventral hernia    Walker as ambulation aid    also uses wheelchair at home/when going out   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   BIOPSY  10/14/2018   Procedure: BIOPSY;  Surgeon: Albertus Gordy HERO, MD;  Location: [facility];  Service: Gastroenterology;;   CATARACT EXTRACTION W/ INTRAOCULAR LENS IMPLANT     Hx: of right eye   CATARACT EXTRACTION W/ INTRAOCULAR LENS IMPLANT Left 2018   COLONOSCOPY N/A 10/13/2014   Procedure: COLONOSCOPY;  Surgeon: Gordy HERO Albertus, MD;  Location: [facility];  Service: Gastroenterology;  Laterality: N/A;   COLONOSCOPY W/ BIOPSIES AND POLYPECTOMY     Hx: of   COLONOSCOPY WITH PROPOFOL  N/A 10/14/2018   Procedure: COLONOSCOPY WITH PROPOFOL ;  Surgeon: Albertus Gordy HERO, MD;  Location: [facility];  Service: Gastroenterology;  Laterality: N/A;   CORONARY ARTERY BYPASS GRAFT  2000   Sudie Laine, MD   CORONARY STENT PLACEMENT  1998   DEEP BRAIN STIMULATOR PLACEMENT  2004  Right and left VIN stimulator placement (parkinsons)   EYE SURGERY     Cataract removed in 1980s related to wood in eye when 73 years old   FINGER AMPUTATION     left pointer   IMPLANTABLE CARDIOVERTER DEFIBRILLATOR IMPLANT N/A 11/13/2013   Procedure: IMPLANTABLE CARDIOVERTER DEFIBRILLATOR IMPLANT;  Surgeon: Danelle LELON Birmingham, MD;  Location: [facility] CATH LAB;  Service: Cardiovascular;  Laterality: N/A;   INSERT / REPLACE / REMOVE PACEMAKER     medtronic   LEFT AND RIGHT HEART CATHETERIZATION WITH CORONARY ANGIOGRAM N/A 09/24/2013   Procedure: LEFT AND RIGHT HEART CATHETERIZATION WITH CORONARY ANGIOGRAM;  Surgeon: Lonni JONETTA Cash, MD;  Location: [facility] CATH LAB;  Service: Cardiovascular;  Laterality: N/A;   LITHOTRIPSY     3 different times   POLYPECTOMY  10/14/2018   Procedure: POLYPECTOMY;  Surgeon: Albertus Gordy HERO, MD;  Location: [facility];  Service: Gastroenterology;;   PULSE GENERATOR IMPLANT Right  11/13/2017   Procedure: Right chest implantable pulse generator change;  Surgeon: Unice Pac, MD;  Location: [facility];  Service: Neurosurgery;  Laterality: Right;  Right chest implantable pulse generator change   SUBTHALAMIC STIMULATOR BATTERY REPLACEMENT N/A 09/05/2012   Procedure: Deep brain stimulator battery change;  Surgeon: Pac Unice, MD;  Location: [facility];  Service: Neurosurgery;  Laterality: N/A;  Deep brain stimulator battery change   SUBTHALAMIC STIMULATOR BATTERY REPLACEMENT N/A 06/10/2015   Procedure: Deep Brain stimulator battery change;  Surgeon: Pac Unice, MD;  Location: [facility];  Service: Neurosurgery;  Laterality: N/A;   SUBTHALAMIC STIMULATOR BATTERY REPLACEMENT Right 09/25/2019   Procedure: Deep brain stimulator battery change;  Surgeon: Unice Pac, MD;  [facility]: [facility] OR;  Service: Neurosurgery;  Laterality: Right;   SUBTHALAMIC STIMULATOR BATTERY REPLACEMENT Right 12/15/2022   Procedure: IMPLANTABLE PULSE GENERATOR  BATTERY REPLACEMENT;  Surgeon: Carollee Lani BROCKS, DO;  Location: [facility] OR;  Service: Neurosurgery;  Laterality: Right;  3C   TONSILLECTOMY     Patient Active Problem List   Diagnosis Date Noted   Skin tear of hand without complication 08/03/2023   Delirium 05/29/2023   Healthcare maintenance 04/08/2023   Cerumen impaction 04/08/2023   Hordeolum 03/21/2023   Pancreatic cyst 03/05/2022   Lower respiratory infection 02/20/2022   Lobar pneumonia (HCC) 02/20/2022   Sore throat 01/12/2022   Cyst of perianal area 01/12/2022   Strep pharyngitis 12/16/2021   Wheezing 12/16/2021   DOE (dyspnea on exertion) 12/16/2021   Parkinson's disease with EBS (electrical brain stimulation) [G20.A1] 12/05/2021   Fall at home 06/15/2021   Asthma    Skin lesion 05/30/2019   CAD (coronary artery disease) 08/25/2018   Diabetes mellitus without complication (HCC) 08/15/2018   Thrombocytopenia (HCC) 07/08/2017   Gout, unspecified 06/06/2017   Dysuria 05/25/2017    Microscopic hematuria 05/25/2017   CKD (chronic kidney disease) stage 3, GFR 30-59 ml/min (HCC) 04/12/2017   Neuropathy 02/25/2017   Urinary frequency 09/19/2016   Constipation 11/27/2015   Medicare annual wellness visit, initial 09/23/2015   Advance care planning 09/23/2015   Spinal stenosis of lumbar region 01/26/2015   History of colonic polyps    Restless leg syndrome 08/11/2014   ICD- MDT, implanted 11/13/13 11/14/2013   Chronic systolic CHF (congestive heart failure) (HCC) 11/13/2013   SOB (shortness of breath), mostly on exertion 06/05/2013   Acute on chronic combined systolic and diastolic congestive heart failure, NYHA class 4 (HCC) 05/12/2013   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   Umbilical hernia 01/27/2013   Elevated PSA 12/04/2012  Other malaise and fatigue 12/04/2012   PD (Parkinson's disease) (HCC) 08/19/2012   OSA on CPAP 04/17/2012   Dysautonomia (HCC) 04/17/2012   Obesity, morbid (HCC) 04/17/2012   Hypothyroidism following radioiodine therapy 10/16/2011   GERD 05/11/2009   Type 2 diabetes mellitus with chronic kidney disease, with long-term current use of insulin  (HCC) 04/26/2009   Back pain 08/18/2008   OTHER SPEC FORMS CHRONIC ISCHEMIC HEART DISEASE 04/24/2008   HYPERPLASIA PROSTATE UNS W/O UR OBST & OTH LUTS 09/02/2007   NEPHROLITHIASIS, HX OF 09/02/2007   Ventral hernia 12/28/2006   Hyperlipidemia 08/31/2006   HTN (hypertension) 08/31/2006   Hx of CABG x 3 in 2000 08/31/2006    ONSET DATE: 08/09/2023 (referral)   REFERRING DIAG: G24.8 (ICD-10-CM) - 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 G20.B2 (ICD-10-CM) - Parkinson's disease with dyskinesia and fluctuating manifestations (HCC)  THERAPY DIAG:  Muscle weakness (generalized)  Other abnormalities of gait and mobility  History of falling  Abnormal posture  Rationale for Evaluation and Treatment: Rehabilitation  SUBJECTIVE:                                                                                                                                                                                              SUBJECTIVE STATEMENT: Marcey   Had a fall at 4:30 this morning - knees slipped (when he was trying to crawl into bed) and his daughter had to help him get up. Did not hurt himself. Notes his neck pain is doing better, had it worked out the other day. Haven't really done his PWR moves.    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 No  PRECAUTIONS: Fall, ICD/Pacemaker, and Other: DBS  RED FLAGS: None   WEIGHT BEARING RESTRICTIONS: No  FALLS: Has patient fallen in last 6 months? Yes. Number of falls 2  LIVING ENVIRONMENT: Lives with: lives with their spouse and Oldest Daughter and her husband Lives in: House/apartment Stairs: Ramped entry  Has following equipment at home: Environmental consultant - 2 wheeled, Environmental consultant - 4 wheeled, Wheelchair (manual), [employer], Grab bars, and U-Step walker   PLOF: Requires assistive device for independence, Needs assistance with ADLs, Needs assistance with gait, and Needs assistance with transfers  PATIENT GOALS: I don't know I just want to keep my strength   OBJECTIVE:  Note: Objective measures were completed at Evaluation unless otherwise noted.  DIAGNOSTIC FINDINGS: None relevant   COGNITION:  Overall cognitive status: History of cognitive impairments - at baseline   SENSATION: Pt denied numbness/tingling in BLEs  COORDINATION: Dystonia in LLE    POSTURE: rounded shoulders, forward head, increased thoracic kyphosis, posterior pelvic tilt, and flexed trunk   LOWER EXTREMITY ROM:     Active  Right Eval Left Eval  Hip flexion    Hip extension    Hip abduction    Hip adduction    Hip internal rotation    Hip external rotation    Knee flexion    Knee extension    Ankle  dorsiflexion    Ankle plantarflexion    Ankle inversion    Ankle eversion     (Blank rows = not tested)  LOWER EXTREMITY MMT:    MMT Right Eval Left Eval  Hip flexion    Hip extension    Hip abduction    Hip adduction    Hip internal rotation    Hip external rotation    Knee flexion    Knee extension    Ankle dorsiflexion    Ankle plantarflexion    Ankle inversion    Ankle eversion    (Blank rows = not tested)  BED MOBILITY:  Not tested Pt frequently sleeps in recliner (lift chair)   TRANSFERS: Sit to stand: CGA  Assistive device utilized: U-Step     Stand to sit: CGA and Min A  Assistive device utilized: U-step      Pt does not fully turn to sit and pushes U-Step away from body, requiring min A to ensure pt did not miss seat or fall forwards when pushing U-Step away. Pt reports he did this because he was fatigued. Pt also demonstrates poor anterior weight shift w/heavy posterior bracing on chair to stand.   RAMP:  Not tested  CURB:  Not tested  STAIRS: Not tested GAIT: Gait pattern: step to pattern, decreased stride length, decreased hip/knee flexion- Right, decreased hip/knee flexion- Left, decreased ankle dorsiflexion- Right, decreased ankle dorsiflexion- Left, shuffling, trunk flexed, wide BOS, poor foot clearance- Right, and poor foot clearance- Left Distance walked: Various short clinic distances  Assistive device utilized: U-Step  Level of assistance: CGA Comments: Pt assumes severe forward-flexed posture w/arms extended on walker. Max verbal and tactile cues provided to facilitate upright posture, which pt able to maintain for brief amount of time prior to resuming forward flexed posture.     VITALS  There were no vitals filed for this visit.                                                                                                                             TREATMENT :    Therapeutic Activity: Needing cues throughout session to turn all the way  around with U-Step rollator and feeling BLE on mat or chair before reaching back to sit down. When pt initially ambulated into clinic to sit down on mat table, did not turn all the way  and left U-Step rollator in front of him and sat on the very edge/corner of the mat table despite cues to keep turning.   With gait with U-Step rollator pt with significant forward flexed posture (almost at 90 degrees) with arms extended out on rollator, when cued to try to stand more erect for more upright posture pt reports this is as tall as he san stand.   Goal Assessment: 5x sit <> stand: 14.7 seconds with BUE support from chair, no BLE bracing against chair  Gait speed: 16.5 seconds with U-Step rollator = 1.99 ft/sec (pt very forwardly flexed) - discussed with gait speed would rather have pt try to focus on more upright posture and staying close to U-Step rollator to help improve stride length/efficiency rather than speed   Bed Mobility: Worked on proper bed mobility technique due to recent fall earlier today when trying to get in the bed when crawling on all 4s and slipped. Reviewed technique practiced in a previous session using the log roll technique with pt laying on his R side and then bringing his legs into the bed. Used this same technique to get out by dropping legs first and then pressing up to sitting, performed 5 reps with pt able to verbalize that this technique can decr his risk of falls and will practice this at home.  Also discussed use of bed rail near just the top of the bed that pt can use to help sit up in bed to improve independence (as pt and pt's spouse reports that sometimes she helps to push pt up). Mimicked a bed rail using the arm rest of a chair for 3 of those reps with pt able to sit up easier. Showed where to purchase from [employer]   TUG shuttle to address small distance gait and turning all the way with U-Step rollator:  Working on sit > stand from mat table (focus on using BUE support  from chair/mat surface instead of holding onto U-Step( and walking 15' to chair and turning around and sitting down to chair/mat (needs cues to turn ALL THE WAY AROUND and feel BLE on chair/surface before reaching back to sit down - as pt frequently forgets to reach back) and then performed same sequence from chair to mat table, performed 6 reps. Needing intermittent cues throughout to make sure pt turns all the way and reaches back ,but did improve with each rep with pt needing no additional verbal cues during last rep. Pt reports that this feels weird as he is not used to doing it this way, but feels safer.    PATIENT EDUCATION: Education details: Results of goals, turning strategies when going to sit down, review of bed mobility technique  Person educated: Patient and Spouse Education method: Explanation, Demonstration, Tactile cues, and Verbal cues Education comprehension: verbalized understanding, returned demonstration, verbal cues required, tactile cues required, and needs further education  HOME EXERCISE PROGRAM: Seated PWR moves   Access Code: [email or web address] URL: [email or web address] Date: 09/05/2023 Prepared by: Marlon Plaster  Program Notes You may perform seated deadlifts with your 5lb kettlebell, 3 sets of 10-15 repetitions   Exercises - Seated Shoulder Horizontal Abduction with Resistance  - 1 x daily - 7 x weekly - 2-3 sets - 10 reps - 3-5 seconds hold  GOALS: Goals reviewed with patient? Yes  SHORT TERM GOALS: Target date: 09/20/2023   Pt will perform floor transfer w/min A from wife for improved fall recovery and safety at home  Baseline: wife reports she  can get pt up if he gets into quadruped, but has a hard time with this  Goal status: INITIAL  2.  Pt will improve 5 x STS to less than or equal to 20 seconds with proper body mechanics and BUE support to demonstrate improved functional strength and transfer efficiency.   Baseline: 17.34s w/BUE support  and improper body mechanics bracing against the chair   14.7 seconds with BUE support from chair, no BLE bracing against chair (9/10) Goal status: MET  3.  TUG to be assessed and LTG updated  Baseline:  Goal status: MET  4.  Pt will improve gait velocity to at least 1.85 ft/s w/U-Step for improved gait efficiency and reduced fall risk   Baseline: 1.64 ft/s w/U-Step   16.5 seconds with U-Step rollator = 1.99 ft/sec (9/10) Goal status: MET   LONG TERM GOALS: Target date: 10/04/2023   Pt will perform final HEP w/min A from wife for improved strength, balance, transfers and gait. Baseline:  Goal status: INITIAL  2.  Pt will improve gait velocity to at least 2.2 ft/s w/U-Step for improved gait efficiency and endurance  Baseline: 1.64 ft/s w/U-Step   16.5 seconds with U-Step rollator = 1.99 ft/sec (9/10) Goal status: REVISED  3.  Pt will improve normal TUG to less than or equal to 17 seconds for improved functional mobility and decreased fall risk.  Baseline: 20.03s w/U-step and CGA Goal status: REVISED   4.  Pt will verbalize understanding of local PD community resources, including fitness post DC.  Baseline:  Goal status: INITIAL  ASSESSMENT:  CLINICAL IMPRESSION: 10th visit PN (performed on visit 9): Began to assess pt's STGs today with pt meeting STG #2 and #4. Pt did improve gait speed with U-Step rollator to 1.99 ft/sec (previously was 1.64 ft/sec), but pt continues with significantly forward flexed posture and pushing U-Step too far anteriorly. Pt did improve his 5x sit <> stand time and able to perform with proper technique with no BLE bracing against mat table. Did not get to check first STG in regards to floor recovery due to time constraints. Remainder of session focused on review of bed mobility (also reviewed extensively in previous sessions) due to recent fall as pt continued to try to crawl into bed. Pt able to demo proper technique of log roll. Also worked  extensively on working on short distance gait and turning all the way with U-Step rollator, backing up to surface and feeling BLE and reaching back before sitting down. Pt tends to not turn all the way before sitting down and could almost miss the surface. With incr reps and practice, pt did need less verbal cues, but will continue to need more practice for carryover. Pt's spouse present during session for cues and carryover for home. Will continue per POC.     OBJECTIVE IMPAIRMENTS: Abnormal gait, decreased activity tolerance, decreased balance, decreased cognition, decreased coordination, decreased endurance, decreased knowledge of use of DME, decreased mobility, difficulty walking, decreased strength, decreased safety awareness, impaired perceived functional ability, increased muscle spasms, improper body mechanics, and postural dysfunction.   ACTIVITY LIMITATIONS: carrying, lifting, sitting, standing, transfers, bed mobility, continence, bathing, dressing, reach over head, hygiene/grooming, locomotion level, and caring for others  PARTICIPATION LIMITATIONS: meal prep, cleaning, laundry, medication management, personal finances, interpersonal relationship, driving, shopping, community activity, and yard work  PERSONAL FACTORS: Behavior pattern, Fitness, Past/current experiences, and 1-2 comorbidities: PD and dysotnia in LLE are also affecting patient's functional outcome.   REHAB POTENTIAL: Fair  Due to severity of deficits   CLINICAL DECISION MAKING: Evolving/moderate complexity  EVALUATION COMPLEXITY: Moderate  PLAN:  PT FREQUENCY: 2x/week  PT DURATION: 6 weeks  PLANNED INTERVENTIONS: 97164- PT Re-evaluation, 97750- Physical Performance Testing, 97110-Therapeutic exercises, 97530- Therapeutic activity, [id number]- Neuromuscular re-education, 97535- Self Care, [id number]- Manual therapy, [phone]- Gait training, [phone]- Electrical stimulation (manual), [phone] (1-2 muscles), [id number] (3+ muscles)- Dry  Needling, Patient/Family education, Balance training, Stair training, Joint mobilization, Spinal mobilization, Vestibular training, DME instructions, and Moist heat  PLAN FOR NEXT SESSION: check last STG (didn't get to do floor transfers)  Work on endurance (Scifit, gait trials), upright posture, safety awareness. TURNS. Lifting. Work on standing posture/weight shifting, car transfers, reaching out of BOS    Sheffield LOISE Senate, PT, DPT 09/19/2023, 4:27 PM

## 2023-09-24 ENCOUNTER — Ambulatory Visit: Admitting: Physical Therapy

## 2023-09-24 DIAGNOSIS — R293 Abnormal posture: Secondary | ICD-10-CM

## 2023-09-24 DIAGNOSIS — R2689 Other abnormalities of gait and mobility: Secondary | ICD-10-CM | POA: Diagnosis not present

## 2023-09-24 DIAGNOSIS — M6281 Muscle weakness (generalized): Secondary | ICD-10-CM | POA: Diagnosis not present

## 2023-09-24 DIAGNOSIS — R29818 Other symptoms and signs involving the nervous system: Secondary | ICD-10-CM | POA: Diagnosis not present

## 2023-09-24 DIAGNOSIS — Z9181 History of falling: Secondary | ICD-10-CM | POA: Diagnosis not present

## 2023-09-24 DIAGNOSIS — R278 Other lack of coordination: Secondary | ICD-10-CM

## 2023-09-24 DIAGNOSIS — R2681 Unsteadiness on feet: Secondary | ICD-10-CM | POA: Diagnosis not present

## 2023-09-24 NOTE — Therapy (Signed)
 OUTPATIENT OCCUPATIONAL THERAPY PARKINSON'S EVALUATION  Patient Name: Henry FRIZELL MRN: [id number] DOB:07/01/1950, 73 y.o., male Today's Date: 09/26/2023  PCP: Cleatus Arlyss RAMAN, MD REFERRING PROVIDER: Carilyn Prentice BRAVO, MD   END OF SESSION:  OT End of Session - 09/26/23 1532     Visit Number 1    Number of Visits 16    Date for OT Re-Evaluation 11/26/23    Authorization Type MCR    Progress Note Due on Visit 10    OT Start Time 1315    OT Stop Time 1400    OT Time Calculation (min) 45 min    Equipment Utilized During Treatment U-step    Activity Tolerance Patient tolerated treatment well    Behavior During Therapy WFL for tasks assessed/performed          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)   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   Dysrhythmia    a-fib   GERD (gastroesophageal reflux disease)    Glaucoma    right eye   History of kidney stones    multiple   HLD (hyperlipidemia)    HTN (hypertension)    pt denies 08/19/12   Hyperplasia, prostate    Hypothyroidism    MI (myocardial infarction) (HCC)    Dr Pietro 2000, x3vessels bypass   Neuromuscular disorder (HCC)    Parkinson's Disease   OSA (obstructive sleep apnea)    AHI-28,on CPAP, noncompliant with CPAP   Parkinson disease (HCC)    1999   PONV (postoperative nausea and vomiting)    Restless legs    Shortness of breath    Hx: of at all times   UTI (lower urinary tract infection) 09/15/2012   Klebsiella   Ventral hernia    Walker as ambulation aid    also uses wheelchair at home/when going out   Past Surgical History:   Procedure Laterality Date   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   BIOPSY  10/14/2018   Procedure: BIOPSY;  Surgeon: Albertus Gordy HERO, MD;  Location: [facility];  Service: Gastroenterology;;   CATARACT EXTRACTION W/ INTRAOCULAR LENS IMPLANT     Hx: of right eye   CATARACT EXTRACTION W/ INTRAOCULAR LENS IMPLANT Left 2018   COLONOSCOPY N/A 10/13/2014   Procedure: COLONOSCOPY;  Surgeon: Gordy HERO Albertus, MD;  Location: [facility];  Service: Gastroenterology;  Laterality: N/A;   COLONOSCOPY W/ BIOPSIES AND POLYPECTOMY     Hx: of   COLONOSCOPY WITH PROPOFOL  N/A 10/14/2018   Procedure: COLONOSCOPY WITH PROPOFOL ;  Surgeon: Albertus Gordy HERO, MD;  Location: [facility];  Service: Gastroenterology;  Laterality: N/A;   CORONARY ARTERY BYPASS GRAFT  2000   Sudie Laine, MD   CORONARY STENT PLACEMENT  1998   DEEP BRAIN STIMULATOR PLACEMENT  2004   Right and left VIN stimulator placement (parkinsons)   EYE SURGERY     Cataract removed in 1980s related to wood in eye when 73 years old   FINGER  AMPUTATION     left pointer   IMPLANTABLE CARDIOVERTER DEFIBRILLATOR IMPLANT N/A 11/13/2013   Procedure: IMPLANTABLE CARDIOVERTER DEFIBRILLATOR IMPLANT;  Surgeon: Danelle LELON Birmingham, MD;  Location: [facility] CATH LAB;  Service: Cardiovascular;  Laterality: N/A;   INSERT / REPLACE / REMOVE PACEMAKER     medtronic   LEFT AND RIGHT HEART CATHETERIZATION WITH CORONARY ANGIOGRAM N/A 09/24/2013   Procedure: LEFT AND RIGHT HEART CATHETERIZATION WITH CORONARY ANGIOGRAM;  Surgeon: Lonni JONETTA Cash, MD;  Location: [facility] CATH LAB;  Service: Cardiovascular;  Laterality: N/A;   LITHOTRIPSY     3 different times   POLYPECTOMY  10/14/2018   Procedure: POLYPECTOMY;  Surgeon: Albertus Gordy HERO, MD;  Location: [facility];  Service: Gastroenterology;;   PULSE GENERATOR IMPLANT Right 11/13/2017   Procedure: Right chest implantable pulse generator change;  Surgeon: Unice Pac, MD;  Location: [facility] OR;  Service: Neurosurgery;   Laterality: Right;  Right chest implantable pulse generator change   SUBTHALAMIC STIMULATOR BATTERY REPLACEMENT N/A 09/05/2012   Procedure: Deep brain stimulator battery change;  Surgeon: Pac Unice, MD;  Location: [facility];  Service: Neurosurgery;  Laterality: N/A;  Deep brain stimulator battery change   SUBTHALAMIC STIMULATOR BATTERY REPLACEMENT N/A 06/10/2015   Procedure: Deep Brain stimulator battery change;  Surgeon: Pac Unice, MD;  Location: [facility];  Service: Neurosurgery;  Laterality: N/A;   SUBTHALAMIC STIMULATOR BATTERY REPLACEMENT Right 09/25/2019   Procedure: Deep brain stimulator battery change;  Surgeon: Unice Pac, MD;  Location: [facility] OR;  Service: Neurosurgery;  Laterality: Right;   SUBTHALAMIC STIMULATOR BATTERY REPLACEMENT Right 12/15/2022   Procedure: IMPLANTABLE PULSE GENERATOR  BATTERY REPLACEMENT;  Surgeon: Carollee Lani BROCKS, DO;  Location: [facility] OR;  Service: Neurosurgery;  Laterality: Right;  3C   TONSILLECTOMY     Patient Active Problem List   Diagnosis Date Noted   Skin tear of hand without complication 08/03/2023   Delirium 05/29/2023   Healthcare maintenance 04/08/2023   Cerumen impaction 04/08/2023   Hordeolum 03/21/2023   Pancreatic cyst 03/05/2022   Lower respiratory infection 02/20/2022   Lobar pneumonia (HCC) 02/20/2022   Sore throat 01/12/2022   Cyst of perianal area 01/12/2022   Strep pharyngitis 12/16/2021   Wheezing 12/16/2021   DOE (dyspnea on exertion) 12/16/2021   Parkinson's disease with EBS (electrical brain stimulation) [G20.A1] 12/05/2021   Fall at home 06/15/2021   Asthma    Skin lesion 05/30/2019   CAD (coronary artery disease) 08/25/2018   Diabetes mellitus without complication (HCC) 08/15/2018   Thrombocytopenia (HCC) 07/08/2017   Gout, unspecified 06/06/2017   Dysuria 05/25/2017   Microscopic hematuria 05/25/2017   CKD (chronic kidney disease) stage 3, GFR 30-59 ml/min (HCC) 04/12/2017   Neuropathy 02/25/2017   Urinary  frequency 09/19/2016   Constipation 11/27/2015   Medicare annual wellness visit, initial 09/23/2015   Advance care planning 09/23/2015   Spinal stenosis of lumbar region 01/26/2015   History of colonic polyps    Restless leg syndrome 08/11/2014   ICD- MDT, implanted 11/13/13 11/14/2013   Chronic systolic CHF (congestive heart failure) (HCC) 11/13/2013   SOB (shortness of breath), mostly on exertion 06/05/2013   Acute on chronic combined systolic and diastolic congestive heart failure, NYHA class 4 (HCC) 05/12/2013   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   Umbilical hernia 01/27/2013   Elevated PSA 12/04/2012   Other malaise and fatigue 12/04/2012   PD (Parkinson's disease) (HCC) 08/19/2012   OSA on CPAP 04/17/2012   Dysautonomia (HCC) 04/17/2012   Obesity, morbid (HCC) 04/17/2012  Hypothyroidism following radioiodine therapy 10/16/2011   GERD 05/11/2009   Type 2 diabetes mellitus with chronic kidney disease, with long-term current use of insulin  (HCC) 04/26/2009   Back pain 08/18/2008   OTHER SPEC FORMS CHRONIC ISCHEMIC HEART DISEASE 04/24/2008   HYPERPLASIA PROSTATE UNS W/O UR OBST & OTH LUTS 09/02/2007   NEPHROLITHIASIS, HX OF 09/02/2007   Ventral hernia 12/28/2006   Hyperlipidemia 08/31/2006   HTN (hypertension) 08/31/2006   Hx of CABG x 3 in 2000 08/31/2006    ONSET DATE: 09/03/2023 (referral date)   REFERRING DIAG: G20.B2 (ICD-10-CM) - Parkinson's disease with dyskinesia and fluctuating manifestations (HCC)   THERAPY DIAG:  Unsteadiness on feet  Other lack of coordination  Other symptoms and signs involving the musculoskeletal system  Other symptoms and signs involving the nervous system  Abnormal posture  Attention and concentration deficit  Frontal lobe and executive function deficit  Rationale for Evaluation and Treatment: Rehabilitation  SUBJECTIVE:   SUBJECTIVE STATEMENT: I can't hear Pt accompanied by: spouse Nadia)   PERTINENT HISTORY: AICD  present, CAD, deaf in R ear, DM II, GERD, MI, PD (1999), DBS (2003)    PRECAUTIONS: Fall, ICD/Pacemaker, and Other: DBS   WEIGHT BEARING RESTRICTIONS: No  PAIN:  Are you having pain? No  FALLS: Has patient fallen in last 6 months? Yes. Number of falls 6  LIVING ENVIRONMENT: Lives with: lives with their spouse and Oldest Daughter and her husband Lives in: ONE level home Stairs: Ramped entry  Has following equipment at home: Environmental consultant - 2 wheeled, Environmental consultant - 4 wheeled, Wheelchair (manual), [employer], Grab bars, and U-Step walker, urinal at night   PLOF: Requires assistive device for independence, Needs assistance with ADLs, Needs assistance with gait, and Needs assistance with transfers  PATIENT GOALS: improve safety, reduce falls  OBJECTIVE:  Note: Objective measures were completed at Evaluation unless otherwise noted.  HAND DOMINANCE: Right  ADLs: Overall ADLs: overall mod assist Transfers/ambulation related to ADLs: uses walker in house, U-Step for community Eating: dep for cutting food, mod I feeding self Grooming: mod I for brushing teeth, wife shaves him  UB Dressing: max assist LB Dressing: max assist - pt pulls up pants once started, wife does shoes and socks d/t pt gets SOB bending over Toileting: assist for perineal care/hygiene and for clothes management Bathing: mod assist overall - Pt can wash UB, wife does from waist down/LB Tub Shower transfers: mod I w/ grab bars, walk in shower and shower seat Equipment: Grab bars and built in shower seat  IADLs: dependent (wife or daughter completing)  Community mobility: does not drive Medication management: wife takes care of Handwriting: 75% legible and in print (name only), less legibility signing   MOBILITY STATUS: regular walker to bathroom, U-Step in community. Wife reports using rollator in kitchen - therapist advised against this as regular rollator rolls too fast and pt cannot stay close enough  POSTURE COMMENTS:   forward head  FUNCTIONAL OUTCOME MEASURES: Physical performance test: PPT#2 (simulated eating) 21.35 Pt requires max assist for jacket d/t deficits and decreased balance Pt does not hook/unhook buttons  COORDINATION: 9 Hole Peg test: Right: 91.41 sec (slower d/t index finger amputation, occasional assist from Lt hand); Left: 78.41 sec Box and Blocks:  Right 24 blocks, Left 19 blocks  UE ROM:  WFL elbows distally,  bilateral shoulder flexion approx 80% w/ stiffness bilateral shoulders   SENSATION: WFL   COGNITION: Overall cognitive status: History of cognitive impairments - at baseline and cognition/memory decline  over several years. 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 findings, realistic goals/POC with pt/wife     PATIENT EDUCATION: Education details: see above Person educated: Patient and Spouse Education method: Explanation Education comprehension: verbalized understanding  HOME EXERCISE PROGRAM: N/A  GOALS: Goals reviewed with patient? Yes  SHORT TERM GOALS: Target date: 10/26/23  Pt independent with modified PD specific HEP (bag ex's, PWR! Hands push only, 3-4 coordination ex's)  Baseline: Goal status: INITIAL  2.  Pt/family will verbalize understanding of adaptive strategies to increase ease/safety with ADLS/IADLS  (dressing, holding fork/spoon at center not end, writing)  Baseline:  Goal status: INITIAL  3.  Pt/family will verbalize and id 2-3 techniques to prevent falls  Baseline:  Goal status: INITIAL  4.  Pt/family to verbalize understanding with A/E to increase  ease/safety with ADLS (LE dressing, opening containers, etc) Baseline:  Goal status: INITIAL   LONG TERM GOALS: Target date: 11/26/23  Pt will verbalize understanding of ways to further PD related complications and appropriate community resources prn  Baseline:  Goal status: INITIAL  2.  Improve BUE function as evidenced by increasing Box & Blocks score by 3 blocks Baseline: RUE = 24, LUE = 19 Goal status: INITIAL  3.  Pt will demonstrate improved ease with feeding as evidenced by decreasing PPT#2 by 3 secs  Baseline: 21.35 sec Goal status: INITIAL  4.  Pt to write name in print at 90% legibility Baseline: 75% legibility Goal status: INITIAL  ASSESSMENT:  CLINICAL IMPRESSION: Patient is a 74 y.o. male who was seen today for occupational therapy evaluation for Parkinson's disease diagnosed in 1999. Pt presents today with bradykinesia, dysarthria, significantly impaired balance, decreased cognition, and slower/difficulty with assist needed for ADLS. Pt would benefit from skilled O.T. to address these deficits, develop PD specific HEP, and educate pt on future PD related complications in order to hopefully increase pt safety with mobility and ADLS, decrease caregiver burden of care, and increase participation in ADLS.    PERFORMANCE DEFICITS: in functional skills including ADLs, coordination, dexterity, ROM, strength, Fine motor control, Gross motor control, hearing, mobility, body mechanics, endurance, decreased knowledge of precautions, decreased knowledge of use of DME, and UE functional use, cognitive skills including attention, memory, problem solving, and safety awareness, and psychosocial skills including coping strategies and environmental adaptation.   IMPAIRMENTS: are limiting patient from ADLs, IADLs, and social participation.   COMORBIDITIES:  may have co-morbidities  that affects occupational performance. Patient will benefit from skilled OT to address above impairments and  improve overall function.  MODIFICATION OR ASSISTANCE TO COMPLETE EVALUATION: Min-Moderate modification of tasks  or assist with assess necessary to complete an evaluation.  OT OCCUPATIONAL PROFILE AND HISTORY: Detailed assessment: Review of records and additional review of physical, cognitive, psychosocial history related to current functional performance.  CLINICAL DECISION MAKING: Moderate - several treatment options, min-mod task modification necessary  REHAB POTENTIAL: Fair severity of deficits, poor carryover/compliance at home  EVALUATION COMPLEXITY: Moderate    PLAN:  OT FREQUENCY: 2x/week  OT DURATION: 8 weeks (anticipate only 4-6 weeks)   PLANNED INTERVENTIONS: 97535 self care/ADL training, 02889 therapeutic exercise, 97530 therapeutic activity, 97112 neuromuscular re-education, 97140 manual therapy, 97010 moist heat, 97760 Orthotic Initial, 97763 Orthotic/Prosthetic subsequent, balance training, functional mobility training, visual/perceptual remediation/compensation, energy conservation, coping strategies training, patient/family education, and DME and/or AE instructions  RECOMMENDED OTHER SERVICES: none at this time  CONSULTED AND AGREED WITH PLAN OF CARE: Patient and family member/caregiver  PLAN FOR NEXT SESSION: bag ex's (3) and PWR! Hands - PWR! Push only   Burnard JINNY Roads, OT 09/26/2023, 3:33 PM

## 2023-09-24 NOTE — Therapy (Signed)
 OUTPATIENT PHYSICAL THERAPY NEURO TREATMENT   Patient Name: Henry [name] MRN: [id number] DOB:[birth year], 73 y.o., male Today's Date: 09/24/2023   PCP: Cleatus Arlyss RAMAN, MD  REFERRING PROVIDER: Carilyn Prentice BRAVO, MD      END OF SESSION:  PT End of Session - 09/24/23 1402     Visit Number 10    Number of Visits 13    Date for PT Re-Evaluation 10/11/23    Authorization Type Medicare    Progress Note Due on Visit --   performed on visit 9   PT Start Time 1359    PT Stop Time 1446    PT Time Calculation (min) 47 min    Equipment Utilized During Treatment Gait belt    Activity Tolerance Patient tolerated treatment well    Behavior During Therapy WFL for tasks assessed/performed             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)   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   Dysrhythmia    a-fib   GERD (gastroesophageal reflux disease)    Glaucoma    right eye   History of kidney stones    multiple   HLD (hyperlipidemia)    HTN (hypertension)    pt denies 08/19/12   Hyperplasia, prostate    Hypothyroidism    MI (myocardial infarction) (HCC)    Dr Pietro 2000, x3vessels bypass   Neuromuscular disorder (HCC)    Parkinson's Disease   OSA (obstructive sleep apnea)    AHI-28,on CPAP, noncompliant with CPAP   Parkinson disease (HCC)    1999   PONV (postoperative nausea and vomiting)    Restless legs    Shortness of breath    Hx: of at all times   UTI (lower urinary tract infection) 09/15/2012   Klebsiella   Ventral hernia    Walker as ambulation aid    also uses wheelchair at home/when going  out   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   BIOPSY  10/14/2018   Procedure: BIOPSY;  Surgeon: Albertus Gordy HERO, MD;  Location: [facility];  Service: Gastroenterology;;   CATARACT EXTRACTION W/ INTRAOCULAR LENS IMPLANT     Hx: of right eye   CATARACT EXTRACTION W/ INTRAOCULAR LENS IMPLANT Left 2018   COLONOSCOPY N/A 10/13/2014   Procedure: COLONOSCOPY;  Surgeon: Gordy HERO Albertus, MD;  Location: [facility];  Service: Gastroenterology;  Laterality: N/A;   COLONOSCOPY W/ BIOPSIES AND POLYPECTOMY     Hx: of   COLONOSCOPY WITH PROPOFOL  N/A 10/14/2018   Procedure: COLONOSCOPY WITH PROPOFOL ;  Surgeon: Albertus Gordy HERO, MD;  Location: [facility];  Service: Gastroenterology;  Laterality: N/A;   CORONARY ARTERY BYPASS GRAFT  2000   Sudie Laine, MD   CORONARY STENT PLACEMENT  1998   DEEP BRAIN STIMULATOR PLACEMENT  2004   Right and left VIN stimulator placement (parkinsons)   EYE SURGERY     Cataract  removed in 1980s related to wood in eye when 73 years old   FINGER AMPUTATION     left pointer   IMPLANTABLE CARDIOVERTER DEFIBRILLATOR IMPLANT N/A 11/13/2013   Procedure: IMPLANTABLE CARDIOVERTER DEFIBRILLATOR IMPLANT;  Surgeon: Danelle LELON Birmingham, MD;  Location: [facility] C[facility] LAB;  Service: Cardiovascular;  Laterality: N/A;   INSERT / REPLACE / REMOVE PACEMAKER     medtronic   LEFT AND RIGHT HEART CATHETERIZATION WITH CORONARY ANGIOGRAM N/A 09/24/2013   Procedure: LEFT AND RIGHT HEART CATHETERIZATION WITH CORONARY ANGIOGRAM;  Surgeon: Lonni JONETTA Cash, MD;  Location: [facility] CATH LAB;  Service: Cardiovascular;  Laterality: N/A;   LITHOTRIPSY     3 different times   POLYPECTOMY  10/14/2018   Procedure: POLYPECTOMY;  Surgeon: Albertus Gordy HERO, MD;  Location: [facility];  Service: Gastroenterology;;   PULSE GENERATOR IMPLANT Right 11/13/2017   Procedure: Right chest implantable pulse generator change;  Surgeon: Unice Pac, MD;  Location:  [facility] OR;  Service: Neurosurgery;  Laterality: Right;  Right chest implantable pulse generator change   SUBTHALAMIC STIMULATOR BATTERY REPLACEMENT N/A 09/05/2012   Procedure: Deep brain stimulator battery change;  Surgeon: Pac Unice, MD;  Location: [facility]O [facility]S;  Service: Neurosurgery;  Laterality: N/A;  Deep brain stimulator battery change   SUBTHALAMIC STIMULATOR BATTERY REPLACEMENT N/A 06/10/2015   Procedure: Deep Brain stimulator battery change;  Surgeon: Pac Unice, MD;  Location: [facility];  Service: Neurosurgery;  Laterality: N/A;   SUBTHALAMIC STIMULATOR BATTERY REPLACEMENT Right 09/25/2019   Procedure: Deep brain stimulator battery change;  Surgeon: Unice Pac, MD;  Location: [facility] OR;  Service: Neurosurgery;  Laterality: Right;   SUBTHALAMIC STIMULATOR BATTERY REPLACEMENT Right 12/15/2022   Procedure: IMPLANTABLE PULSE GENERATOR  BATTERY REPLACEMENT;  Surgeon: Carollee Lani BROCKS, DO;  Location: [facility] OR;  Service: Neurosurgery;  Laterality: Right;  3C   TONSILLECTOMY     Patient Active Problem List   Diagnosis Date Noted   Skin tear of hand without complication 08/03/2023   Delirium 05/29/2023   Healthcare maintenance 04/08/2023   Cerumen impaction 04/08/2023   Hordeolum 03/21/2023   Pancreatic cyst 03/05/2022   Lower respiratory infection 02/20/2022   Lobar pneumonia (HCC) 02/20/2022   Sore throat 01/12/2022   Cyst of perianal area 01/12/2022   Strep pharyngitis 12/16/2021   Wheezing 12/16/2021   DOE (dyspnea on exertion) 12/16/2021   Parkinson's disease with EBS (electrical brain stimulation) [G20.A1] 12/05/2021   Fall at home 06/15/2021   Asthma    Skin lesion 05/30/2019   CAD (coronary artery disease) 08/25/2018   Diabetes mellitus without complication (HCC) 08/15/2018   Thrombocytopenia (HCC) 07/08/2017   Gout, unspecified 06/06/2017   Dysuria 05/25/2017   Microscopic hematuria 05/25/2017   CKD (chronic kidney disease) stage 3, GFR 30-59 ml/min (HCC) 04/12/2017    Neuropathy 02/25/2017   Urinary frequency 09/19/2016   Constipation 11/27/2015   Medicare annual wellness visit, initial 09/23/2015   Advance care planning 09/23/2015   Spinal stenosis of lumbar region 01/26/2015   History of colonic polyps    Restless leg syndrome 08/11/2014   ICD- MDT, implanted 11/13/13 11/14/2013   Chronic systolic CHF (congestive heart failure) (HCC) 11/13/2013   SOB (shortness of breath), mostly on exertion 06/05/2013   Acute on chronic combined systolic and diastolic congestive heart failure, NYHA class 4 (HCC) 05/12/2013   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   Umbilical hernia 01/27/2013   Elevated PSA 12/04/2012   Other malaise and fatigue 12/04/2012   PD (Parkinson's disease) (HCC) 08/19/2012  OSA on CPAP 04/17/2012   Dysautonomia (HCC) 04/17/2012   Obesity, morbid (HCC) 04/17/2012   Hypothyroidism following radioiodine therapy 10/16/2011   GERD 05/11/2009   Type 2 diabetes mellitus with chronic kidney disease, with long-term current use of insulin  (HCC) 04/26/2009   Back pain 08/18/2008   OTHER SPEC FORMS CHRONIC ISCHEMIC HEART DISEASE 04/24/2008   HYPERPLASIA PROSTATE UNS W/O UR OBST & OTH LUTS 09/02/2007   NEPHROLITHIASIS, HX OF 09/02/2007   Ventral hernia 12/28/2006   Hyperlipidemia 08/31/2006   HTN (hypertension) 08/31/2006   Hx of CABG x 3 in 2000 08/31/2006    ONSET DATE: 08/09/2023 (referral)   REFERRING DIAG: G24.8 (ICD-10-CM) - 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 G20.B2 (ICD-10-CM) - Parkinson's disease with dyskinesia and fluctuating manifestations (HCC)  THERAPY DIAG:  Muscle weakness (generalized)  Other abnormalities of gait and mobility  History of falling  Abnormal posture  Other lack of coordination  Rationale for Evaluation and Treatment: Rehabilitation  SUBJECTIVE:                                                                                                                                                                                              SUBJECTIVE STATEMENT: Glenn   Pt presents w/rollator, states he did not want to use U-step today. Denies any falls since last session. Wanting to know how therapist eats cereal.   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 No  PRECAUTIONS: Fall, ICD/Pacemaker, and Other: DBS  RED FLAGS: None   WEIGHT BEARING RESTRICTIONS: No  FALLS: Has patient fallen in last 6 months? Yes. Number of falls 2  LIVING ENVIRONMENT: Lives with: lives with their spouse and Oldest Daughter and her husband Lives in: House/apartment Stairs: Ramped entry  Has following equipment at home: Environmental consultant - 2 wheeled, Environmental consultant - 4 wheeled, Wheelchair (manual), [employer], Grab bars, and U-Step walker   PLOF: Requires assistive device for independence, Needs assistance with ADLs, Needs assistance with gait, and Needs assistance with transfers  PATIENT GOALS: I don't know I just want to keep my strength   OBJECTIVE:  Note: Objective measures were completed at Evaluation unless otherwise noted.  DIAGNOSTIC FINDINGS: None relevant   COGNITION: Overall cognitive status: History of cognitive impairments - at baseline   SENSATION: Pt denied numbness/tingling in BLEs  COORDINATION: Dystonia in LLE    POSTURE: rounded shoulders, forward head, increased thoracic kyphosis, posterior pelvic  tilt, and flexed trunk   LOWER EXTREMITY ROM:     Active  Right Eval Left Eval  Hip flexion    Hip extension    Hip abduction    Hip adduction    Hip internal rotation    Hip external rotation    Knee flexion    Knee extension    Ankle dorsiflexion    Ankle plantarflexion    Ankle inversion    Ankle eversion     (Blank rows = not tested)  LOWER EXTREMITY MMT:    MMT Right Eval Left Eval  Hip flexion    Hip  extension    Hip abduction    Hip adduction    Hip internal rotation    Hip external rotation    Knee flexion    Knee extension    Ankle dorsiflexion    Ankle plantarflexion    Ankle inversion    Ankle eversion    (Blank rows = not tested)  BED MOBILITY:  Not tested Pt frequently sleeps in recliner (lift chair)   TRANSFERS: Sit to stand: CGA  Assistive device utilized: U-Step     Stand to sit: CGA and Min A  Assistive device utilized: U-step      Pt does not fully turn to sit and pushes U-Step away from body, requiring min A to ensure pt did not miss seat or fall forwards when pushing U-Step away. Pt reports he did this because he was fatigued. Pt also demonstrates poor anterior weight shift w/heavy posterior bracing on chair to stand.   RAMP:  Not tested  CURB:  Not tested  STAIRS: Not tested GAIT: Gait pattern: step to pattern, decreased stride length, decreased hip/knee flexion- Right, decreased hip/knee flexion- Left, decreased ankle dorsiflexion- Right, decreased ankle dorsiflexion- Left, shuffling, trunk flexed, wide BOS, poor foot clearance- Right, and poor foot clearance- Left Distance walked: Various short clinic distances  Assistive device utilized: U-Step  Level of assistance: CGA Comments: Pt assumes severe forward-flexed posture w/arms extended on walker. Max verbal and tactile cues provided to facilitate upright posture, which pt able to maintain for brief amount of time prior to resuming forward flexed posture.     VITALS  There were no vitals filed for this visit.                                                                                                                             TREATMENT :    Therapeutic Activity/Self-care: Pt required mod A to safely ambulate into clinic w/rollator due to severe forward flexed posture and poor brake management. Informed pt and wife that pt is not safe with this AD and he needs to use U-step at all times. Wife  reports that pt got fixated on using rollator today and would not use U-step, but agreed that pt is not safe w/this AD. Wife reports that pt has had multiple falls w/device but likes to use it because  it can roll on grass and gravel. Informed pt and wife that pt is not safe to be walking on gravel and grass, so would discontinue use of AD. Pt verbalized understanding.  Discussed PT POC and pt in agreement to DC at end of POC. Informed pt we will schedule screens once pt completes OT (has eval this week). Pt and wife verbalized understanding.  SciFit multi-peaks level 9.5 for 8 minutes using BUE/BLEs for neural priming for reciprocal movement, dynamic cardiovascular warmup and increased amplitude of stepping. RPE of 5/10 following activity  Practiced floor transfers w/pt x1 for improved fall recovery safety and functional strength. Provided demonstration of proper technique to get into floor in tall kneel and pt able to perform w/max concurrent cues and CGA. Pt then able to perform tall > half kneel > stand w/CGA and max cues well w/no instability.  Progressed to practicing long sit> sidelying > prone > quadruped on mat x2 reps as wife reports pt often lands on his buttocks and has a very hard time getting onto his knees. Provided max cues for pt to roll onto R shoulder and then provided min A to bring LLE over RLE to facilitate prone position. Once in prone, pt able to slide hands under shoulders and push up to quadruped w/min A. Pt did require min-mod A to perform prone > sidelying and max A x2 to prop into long sitting due to core weakness. Had pt's wife practice w/pt on second rep and wife cued pt to lie supine and pt unable to breathe, so therapist intervened and provided max A x2 to sit up into long sit. Informed wife that pt is not to lie supine as he cannot breathe in this position, wife verbalized understanding. Wife required min-mod A from therapist to properly cue to into transitional positions but  reported feeling comfortable w/this at home.  Escorted pt out of clinic due to using unsafe rollator and pt impulsively stopped, locked brakes and tried to sit in floor due to fatigue without informing therapist. Pt safely transferred into sitting on rollator w/assistance of 3 therapists and wife rolled pt out of clinic on rollator.    PATIENT EDUCATION: Education details: Proper floor transfer technique, safety concerns w/use of rollator, PT POC   Person educated: Patient and Spouse Education method: Explanation, Demonstration, Tactile cues, and Verbal cues Education comprehension: verbalized understanding, returned demonstration, verbal cues required, tactile cues required, and needs further education  HOME EXERCISE PROGRAM: Seated PWR moves   Access Code: [email or web address] URL: [email or web address] Date: 09/05/2023 Prepared by: Marlon Tryton Bodi  Program Notes You may perform seated deadlifts with your 5lb kettlebell, 3 sets of 10-15 repetitions   Exercises - Seated Shoulder Horizontal Abduction with Resistance  - 1 x daily - 7 x weekly - 2-3 sets - 10 reps - 3-5 seconds hold  GOALS: Goals reviewed with patient? Yes  SHORT TERM GOALS: Target date: 09/20/2023   Pt will perform floor transfer w/min A from wife for improved fall recovery and safety at home  Baseline: wife reports she can get pt up if he gets into quadruped, but has a hard time with this  Goal status: PARTIALLY MET  2.  Pt will improve 5 x STS to less than or equal to 20 seconds with proper body mechanics and BUE support to demonstrate improved functional strength and transfer efficiency.   Baseline: 17.34s w/BUE support and improper body mechanics bracing against the chair   14.7 seconds with BUE support from chair, no  BLE bracing against chair (9/10) Goal status: MET  3.  TUG to be assessed and LTG updated  Baseline:  Goal status: MET  4.  Pt will improve gait velocity to at least 1.85 ft/s w/U-Step  for improved gait efficiency and reduced fall risk   Baseline: 1.64 ft/s w/U-Step   16.5 seconds with U-Step rollator = 1.99 ft/sec (9/10) Goal status: MET   LONG TERM GOALS: Target date: 10/04/2023   Pt will perform final HEP w/min A from wife for improved strength, balance, transfers and gait. Baseline:  Goal status: INITIAL  2.  Pt will improve gait velocity to at least 2.2 ft/s w/U-Step for improved gait efficiency and endurance  Baseline: 1.64 ft/s w/U-Step   16.5 seconds with U-Step rollator = 1.99 ft/sec (9/10) Goal status: REVISED  3.  Pt will improve normal TUG to less than or equal to 17 seconds for improved functional mobility and decreased fall risk.  Baseline: 20.03s w/U-step and CGA Goal status: REVISED   4.  Pt will verbalize understanding of local PD community resources, including fitness post DC.  Baseline:  Goal status: INITIAL  ASSESSMENT:  CLINICAL IMPRESSION: Emphasis of skilled PT session on completing STG assessment and pt/wife education on safety. Pt presented to session w/rollator, which pt has had several falls with and is extremely unsafe with. Informed pt he is not to use rollator as he required min A to safely ambulate into clinic due to severe forward flexed posture and poor brake management. Educated and had pt demonstrate proper floor transfer technique today and pt able to perform stand > tall kneel w/CGA and BUE support but min A to perform long sit > quadruped. Had pt's wife practice w/pt but wife unable to cue pt well, requiring min-mod A from therapist to safely perform and max A x2 to perform supine > long sit as pt cannot breathe in supine. Due to this, pt did not fully meet STG #1. Pt informed of plan to DC at end of scheduled PT visits and will schedule screens for 3 months once DC from OT. Continue POC.     OBJECTIVE IMPAIRMENTS: Abnormal gait, decreased activity tolerance, decreased balance, decreased cognition, decreased coordination,  decreased endurance, decreased knowledge of use of DME, decreased mobility, difficulty walking, decreased strength, decreased safety awareness, impaired perceived functional ability, increased muscle spasms, improper body mechanics, and postural dysfunction.   ACTIVITY LIMITATIONS: carrying, lifting, sitting, standing, transfers, bed mobility, continence, bathing, dressing, reach over head, hygiene/grooming, locomotion level, and caring for others  PARTICIPATION LIMITATIONS: meal prep, cleaning, laundry, medication management, personal finances, interpersonal relationship, driving, shopping, community activity, and yard work  PERSONAL FACTORS: Behavior pattern, Fitness, Past/current experiences, and 1-2 comorbidities: PD and dysotnia in LLE are also affecting patient's functional outcome.   REHAB POTENTIAL: Fair Due to severity of deficits   CLINICAL DECISION MAKING: Evolving/moderate complexity  EVALUATION COMPLEXITY: Moderate  PLAN:  PT FREQUENCY: 2x/week  PT DURATION: 6 weeks  PLANNED INTERVENTIONS: 97164- PT Re-evaluation, 97750- Physical Performance Testing, 97110-Therapeutic exercises, 97530- Therapeutic activity, [id number]- Neuromuscular re-education, 97535- Self Care, [id number]- Manual therapy, [id number]- Gait training, [phone]- Electrical stimulation (manual), [phone] (1-2 muscles), [id number] (3+ muscles)- Dry Needling, Patient/Family education, Balance training, Stair training, Joint mobilization, Spinal mobilization, Vestibular training, DME instructions, and Moist heat  PLAN FOR NEXT SESSION: Review floor transfers.  Work on endurance [employer], gait trials), upright posture, safety awareness. TURNS. Lifting. Work on standing posture/weight shifting, car transfers, reaching out of BOS   Plan to DC at  end of current POC and schedule 106mo PD screens   Marlon BRAVO Mavery Milling, PT, DPT 09/24/2023, 2:55 PM

## 2023-09-26 ENCOUNTER — Ambulatory Visit: Admitting: Occupational Therapy

## 2023-09-26 ENCOUNTER — Ambulatory Visit: Admitting: Physical Therapy

## 2023-09-26 ENCOUNTER — Encounter: Payer: Self-pay | Admitting: Physical Therapy

## 2023-09-26 DIAGNOSIS — R293 Abnormal posture: Secondary | ICD-10-CM

## 2023-09-26 DIAGNOSIS — R29898 Other symptoms and signs involving the musculoskeletal system: Secondary | ICD-10-CM

## 2023-09-26 DIAGNOSIS — R41840 Attention and concentration deficit: Secondary | ICD-10-CM

## 2023-09-26 DIAGNOSIS — R41844 Frontal lobe and executive function deficit: Secondary | ICD-10-CM

## 2023-09-26 DIAGNOSIS — R29818 Other symptoms and signs involving the nervous system: Secondary | ICD-10-CM

## 2023-09-26 DIAGNOSIS — R278 Other lack of coordination: Secondary | ICD-10-CM

## 2023-09-26 DIAGNOSIS — Z9181 History of falling: Secondary | ICD-10-CM

## 2023-09-26 DIAGNOSIS — R2681 Unsteadiness on feet: Secondary | ICD-10-CM | POA: Diagnosis not present

## 2023-09-26 DIAGNOSIS — M6281 Muscle weakness (generalized): Secondary | ICD-10-CM | POA: Diagnosis not present

## 2023-09-26 DIAGNOSIS — R2689 Other abnormalities of gait and mobility: Secondary | ICD-10-CM

## 2023-09-26 NOTE — Therapy (Signed)
 OUTPATIENT PHYSICAL THERAPY NEURO TREATMENT   Patient Name: Henry [name] MRN: [id number] DOB:11-15-1950, 73 y.o., male Today's Date: 09/26/2023   PCP: Cleatus Arlyss RAMAN, MD  REFERRING PROVIDER: Carilyn Prentice BRAVO, MD   END OF SESSION:  PT End of Session - 09/26/23 1402     Visit Number 11    Number of Visits 13    Date for PT Re-Evaluation 10/11/23    Authorization Type Medicare    Progress Note Due on Visit --   performed on visit 9   PT Start Time 1400    PT Stop Time 1441    PT Time Calculation (min) 41 min    Equipment Utilized During Treatment Gait belt    Activity Tolerance Patient tolerated treatment well    Behavior During Therapy WFL for tasks assessed/performed             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)   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   Dysrhythmia    a-fib   GERD (gastroesophageal reflux disease)    Glaucoma    right eye   History of kidney stones    multiple   HLD (hyperlipidemia)    HTN (hypertension)    pt denies 08/19/12   Hyperplasia, prostate    Hypothyroidism    MI (myocardial infarction) (HCC)    Dr Pietro 2000, x3vessels bypass   Neuromuscular disorder (HCC)    Parkinson's Disease   OSA (obstructive sleep apnea)    AHI-28,on CPAP, noncompliant with CPAP   Parkinson disease (HCC)    1999   PONV (postoperative nausea and vomiting)    Restless legs    Shortness of breath    Hx: of at all times   UTI (lower urinary tract infection) 09/15/2012   Klebsiella   Ventral hernia    Walker as ambulation aid    also uses wheelchair at home/when going out    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   BIOPSY  10/14/2018   Procedure: BIOPSY;  Surgeon: Albertus Gordy HERO, MD;  Location: [facility];  Service: Gastroenterology;;   CATARACT EXTRACTION W/ INTRAOCULAR LENS IMPLANT     Hx: of right eye   CATARACT EXTRACTION W/ INTRAOCULAR LENS IMPLANT Left 2018   COLONOSCOPY N/A 10/13/2014   Procedure: COLONOSCOPY;  Surgeon: Gordy HERO Albertus, MD;  Location: [facility];  Service: Gastroenterology;  Laterality: N/A;   COLONOSCOPY W/ BIOPSIES AND POLYPECTOMY     Hx: of   COLONOSCOPY WITH PROPOFOL  N/A 10/14/2018   Procedure: COLONOSCOPY WITH PROPOFOL ;  Surgeon: Albertus Gordy HERO, MD;  Location: [facility];  Service: Gastroenterology;  Laterality: N/A;   CORONARY ARTERY BYPASS GRAFT  2000   Sudie Laine, MD   CORONARY STENT PLACEMENT  1998   DEEP BRAIN STIMULATOR PLACEMENT  2004   Right and left VIN stimulator placement (parkinsons)   EYE SURGERY     Cataract removed in 1980s  related to wood in eye when 73 years old   FINGER AMPUTATION     left pointer   IMPLANTABLE CARDIOVERTER DEFIBRILLATOR IMPLANT N/A 11/13/2013   Procedure: IMPLANTABLE CARDIOVERTER DEFIBRILLATOR IMPLANT;  Surgeon: Danelle LELON Birmingham, MD;  Location: [facility] CATH LAB;  Service: Cardiovascular;  Laterality: N/A;   INSERT / REPLACE / REMOVE PACEMAKER     medtronic   LEFT AND RIGHT HEART CATHETERIZATION WITH CORONARY ANGIOGRAM N/A 09/24/2013   Procedure: LEFT AND RIGHT HEART CATHETERIZATION WITH CORONARY ANGIOGRAM;  Surgeon: Lonni JONETTA Cash, MD;  Location: [facility] CATH LAB;  Service: Cardiovascular;  Laterality: N/A;   LITHOTRIPSY     3 different times   POLYPECTOMY  10/14/2018   Procedure: POLYPECTOMY;  Surgeon: Albertus Gordy HERO, MD;  Location: [facility];  Service: Gastroenterology;;   PULSE GENERATOR IMPLANT Right 11/13/2017   Procedure: Right chest implantable pulse generator change;  Surgeon: Unice Pac, MD;  Location: [facility]  OR;  Service: Neurosurgery;  Laterality: Right;  Right chest implantable pulse generator change   SUBTHALAMIC STIMULATOR BATTERY REPLACEMENT N/A 09/05/2012   Procedure: Deep brain stimulator battery change;  Surgeon: Pac Unice, MD;  Location: [facility] NEURO ORS;  Service: Neurosurgery;  Laterality: N/A;  Deep brain stimulator battery change   SUBTHALAMIC STIMULATOR BATTERY REPLACEMENT N/A 06/10/2015   Procedure: Deep Brain stimulator battery change;  Surgeon: Pac Unice, MD;  Location: [facility] NEURO ORS;  Service: Neurosurgery;  Laterality: N/A;   SUBTHALAMIC STIMULATOR BATTERY REPLACEMENT Right 09/25/2019   Procedure: Deep brain stimulator battery change;  Surgeon: Unice Pac, MD;  Location: [facility] OR;  Service: Neurosurgery;  Laterality: Right;   SUBTHALAMIC STIMULATOR BATTERY REPLACEMENT Right 12/15/2022   Procedure: IMPLANTABLE PULSE GENERATOR  BATTERY REPLACEMENT;  Surgeon: Carollee Lani BROCKS, DO;  Location: [facility] OR;  Service: Neurosurgery;  Laterality: Right;  3C   TONSILLECTOMY     Patient Active Problem List   Diagnosis Date Noted   Skin tear of hand without complication 08/03/2023   Delirium 05/29/2023   Healthcare maintenance 04/08/2023   Cerumen impaction 04/08/2023   Hordeolum 03/21/2023   Pancreatic cyst 03/05/2022   Lower respiratory infection 02/20/2022   Lobar pneumonia (HCC) 02/20/2022   Sore throat 01/12/2022   Cyst of perianal area 01/12/2022   Strep pharyngitis 12/16/2021   Wheezing 12/16/2021   DOE (dyspnea on exertion) 12/16/2021   Parkinson's disease with EBS (electrical brain stimulation) [G20.A1] 12/05/2021   Fall at home 06/15/2021   Asthma    Skin lesion 05/30/2019   CAD (coronary artery disease) 08/25/2018   Diabetes mellitus without complication (HCC) 08/15/2018   Thrombocytopenia (HCC) 07/08/2017   Gout, unspecified 06/06/2017   Dysuria 05/25/2017   Microscopic hematuria 05/25/2017   CKD (chronic kidney disease) stage 3, GFR 30-59 ml/min (HCC) 04/12/2017    Neuropathy 02/25/2017   Urinary frequency 09/19/2016   Constipation 11/27/2015   Medicare annual wellness visit, initial 09/23/2015   Advance care planning 09/23/2015   Spinal stenosis of lumbar region 01/26/2015   History of colonic polyps    Restless leg syndrome 08/11/2014   ICD- MDT, implanted 11/13/13 11/14/2013   Chronic systolic CHF (congestive heart failure) (HCC) 11/13/2013   SOB (shortness of breath), mostly on exertion 06/05/2013   Acute on chronic combined systolic and diastolic congestive heart failure, NYHA class 4 (HCC) 05/12/2013   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   Umbilical hernia 01/27/2013   Elevated PSA 12/04/2012   Other malaise and fatigue 12/04/2012   PD (Parkinson's disease) (HCC) 08/19/2012   OSA on CPAP  04/17/2012   Dysautonomia (HCC) 04/17/2012   Obesity, morbid (HCC) 04/17/2012   Hypothyroidism following radioiodine therapy 10/16/2011   GERD 05/11/2009   Type 2 diabetes mellitus with chronic kidney disease, with long-term current use of insulin  (HCC) 04/26/2009   Back pain 08/18/2008   OTHER SPEC FORMS CHRONIC ISCHEMIC HEART DISEASE 04/24/2008   HYPERPLASIA PROSTATE UNS W/O UR OBST & OTH LUTS 09/02/2007   NEPHROLITHIASIS, HX OF 09/02/2007   Ventral hernia 12/28/2006   Hyperlipidemia 08/31/2006   HTN (hypertension) 08/31/2006   Hx of CABG x 3 in 2000 08/31/2006    ONSET DATE: 08/09/2023 (referral)   REFERRING DIAG: G24.8 (ICD-10-CM) - 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 Reports today is a tougher day. No new falls. Brought in U-Step rollator today.   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 No  PRECAUTIONS: Fall, ICD/Pacemaker, and Other: DBS  RED FLAGS: None   WEIGHT BEARING RESTRICTIONS: No  FALLS: Has patient fallen in last 6 months? Yes. Number of falls 2  LIVING ENVIRONMENT: Lives with: lives with their spouse and Oldest Daughter and her husband Lives in: House/apartment Stairs: Ramped entry  Has following equipment at home: Environmental consultant - 2 wheeled, Environmental consultant - 4 wheeled, Wheelchair (manual), [employer], Grab bars, and U-Step walker   PLOF: Requires assistive device for independence, Needs assistance with ADLs, Needs assistance with gait, and Needs assistance with transfers  PATIENT GOALS: I don't know I just want to keep my strength   OBJECTIVE:  Note: Objective measures were completed at Evaluation unless otherwise noted.  DIAGNOSTIC FINDINGS: None relevant   COGNITION: Overall cognitive status: History of cognitive impairments - at baseline   SENSATION: Pt denied numbness/tingling in BLEs  COORDINATION: Dystonia in LLE    POSTURE: rounded shoulders, forward head, increased thoracic kyphosis, posterior pelvic tilt, and flexed trunk  LOWER EXTREMITY ROM:     Active  Right Eval Left Eval  Hip flexion    Hip extension    Hip abduction    Hip adduction    Hip internal rotation    Hip external rotation    Knee flexion    Knee extension    Ankle dorsiflexion    Ankle plantarflexion    Ankle inversion    Ankle eversion     (Blank rows = not tested)  LOWER EXTREMITY MMT:    MMT Right Eval Left Eval  Hip flexion    Hip extension    Hip  abduction    Hip adduction    Hip internal rotation    Hip external rotation    Knee flexion    Knee extension    Ankle dorsiflexion    Ankle plantarflexion    Ankle inversion    Ankle eversion    (Blank rows = not tested)  BED MOBILITY:  Not tested Pt frequently sleeps in recliner (lift chair)   TRANSFERS: Sit to stand: CGA  Assistive device utilized: U-Step     Stand to sit: CGA and Min A  Assistive device utilized: U-step      Pt does not fully turn to sit and pushes U-Step away from body, requiring min A to ensure pt did not miss seat or fall forwards when pushing U-Step away. Pt reports he did this because he was fatigued. Pt also demonstrates poor anterior weight shift w/heavy posterior bracing on chair to stand.   RAMP:  Not tested  CURB:  Not tested  STAIRS: Not tested GAIT: Gait pattern: step to pattern, decreased stride length, decreased hip/knee flexion- Right, decreased hip/knee flexion- Left, decreased ankle dorsiflexion- Right, decreased ankle dorsiflexion- Left, shuffling, trunk flexed, wide BOS, poor foot clearance- Right, and poor foot clearance- Left Distance walked: Various short clinic distances  Assistive device utilized: U-Step  Level of assistance: CGA Comments: Pt assumes severe forward-flexed posture w/arms extended on walker. Max verbal and tactile cues provided to facilitate upright posture, which pt able to maintain for brief amount of time prior to resuming forward flexed posture.     VITALS  There were no vitals filed for this visit.                                                                                                                             TREATMENT :    Therapeutic Activity: Pt brought in U-Step rollator to clinic today with pt with continued severe forward flexed posture with arms extended on walker, continued cues during session for more upright posture and trying to stay closer to walker, pt able to perform very briefly  with slightly improved posture, but then reverts back to significantly flexed position  When going to sit on mat table, needs cues to turn all the way around, feel BLE on the mat table and then reach posteriorly to help safely lower to the  mat table  Sit <> stands 10 reps from chair as pt reporting he wants to work on these today and has been having more difficulty with these, cues to make sure that he scoots out to the edge, has a wider BOS (thinking about taking big steps laterally before standing up), and using BUE support to press up from the mat table instead of pulling up on rollator. Pt reports he initially tends to feel more off balance when going to stand and pt frequently has a very narrow BOS  Performed an additional 10 reps from air ex for immediate standing balance, initial CGA and cues for tall posture in standing position as pt very forward flexed, noted some R ankle instability on air ex   Multiple additional reps performed during sesison with frequent reminder verbal/demo cues of making sure pt has a wide BOS before standing as pt will just stand with narrow BOS. Pt's spouse reports she was unaware of this so discussed with her about cueing pt at home    NMR:  In // bars: Alternating forward stepping to targets 12 reps each leg,cues for posture throughout with trying to look up to mirror and taking larger steps forward and laterally to colorful floor dot, pt reporting his arms and back were tired with this exercise afterwards dur to incr forward flexed posture  PWR Rock with single UE support, 2 sets of 5 reps each side, with cues to reach up towards the ceiling, cued to look up at hands when reaching, pt needing a seated rest break between each set due to feeling like his legs are going to give out, pt reporting RPE as 7/10 With BUE support: alternating posterior stepping 2 x 10 reps each side, cues for trying to stay close to // bar to try to help with upright posture and cues for  taking a big step back and laterally  Frequent seated rest breaks needed due to fatigue      PATIENT EDUCATION: Education details: sit <> stand technique Person educated: Patient and Spouse Education method: Explanation, Demonstration, Tactile cues, and Verbal cues Education comprehension: verbalized understanding, returned demonstration, verbal cues required, tactile cues required, and needs further education  HOME EXERCISE PROGRAM: Seated PWR moves   Access Code: [email or web address] URL: [email or web address] Date: 09/05/2023 Prepared by: Marlon Plaster  Program Notes You may perform seated deadlifts with your 5lb kettlebell, 3 sets of 10-15 repetitions   Exercises - Seated Shoulder Horizontal Abduction with Resistance  - 1 x daily - 7 x weekly - 2-3 sets - 10 reps - 3-5 seconds hold  GOALS: Goals reviewed with patient? Yes  SHORT TERM GOALS: Target date: 09/20/2023   Pt will perform floor transfer w/min A from wife for improved fall recovery and safety at home  Baseline: wife reports she can get pt up if he gets into quadruped, but has a hard time with this  Goal status: PARTIALLY MET  2.  Pt will improve 5 x STS to less than or equal to 20 seconds with proper body mechanics and BUE support to demonstrate improved functional strength and transfer efficiency.   Baseline: 17.34s w/BUE support and improper body mechanics bracing against the chair   14.7 seconds with BUE support from chair, no BLE bracing against chair (9/10) Goal status: MET  3.  TUG to be assessed and LTG updated  Baseline:  Goal status: MET  4.  Pt will improve gait velocity to at least 1.85 ft/s w/U-Step for improved gait efficiency  and reduced fall risk   Baseline: 1.64 ft/s w/U-Step   16.5 seconds with U-Step rollator = 1.99 ft/sec (9/10) Goal status: MET   LONG TERM GOALS: Target date: 10/04/2023   Pt will perform final HEP w/min A from wife for improved strength, balance, transfers  and gait. Baseline:  Goal status: INITIAL  2.  Pt will improve gait velocity to at least 2.2 ft/s w/U-Step for improved gait efficiency and endurance  Baseline: 1.64 ft/s w/U-Step   16.5 seconds with U-Step rollator = 1.99 ft/sec (9/10) Goal status: REVISED  3.  Pt will improve normal TUG to less than or equal to 17 seconds for improved functional mobility and decreased fall risk.  Baseline: 20.03s w/U-step and CGA Goal status: REVISED   4.  Pt will verbalize understanding of local PD community resources, including fitness post DC.  Baseline:  Goal status: INITIAL  ASSESSMENT:  CLINICAL IMPRESSION: Pt reporting wanting to work on sit <> stands today as these have been more challenging for pt. Reviewed technique of making sure pt scoots out to the edge, uses BUE support from chair, and biggest cue is to widen BOS (as pt very frequently stands with narrow BOS and feet basically together). Outside of blocked practice of sit <> stands, pt needs frequent reminder cues to widen BOS. Remainder of session focused on standing weight shifting exercises with BUE support with pt maintaining a flexed posture throughout and needing seated rest breaks frequently due to fatigue. Continue POC.     OBJECTIVE IMPAIRMENTS: Abnormal gait, decreased activity tolerance, decreased balance, decreased cognition, decreased coordination, decreased endurance, decreased knowledge of use of DME, decreased mobility, difficulty walking, decreased strength, decreased safety awareness, impaired perceived functional ability, increased muscle spasms, improper body mechanics, and postural dysfunction.   ACTIVITY LIMITATIONS: carrying, lifting, sitting, standing, transfers, bed mobility, continence, bathing, dressing, reach over head, hygiene/grooming, locomotion level, and caring for others  PARTICIPATION LIMITATIONS: meal prep, cleaning, laundry, medication management, personal finances, interpersonal relationship,  driving, shopping, community activity, and yard work  PERSONAL FACTORS: Behavior pattern, Fitness, Past/current experiences, and 1-2 comorbidities: PD and dysotnia in LLE are also affecting patient's functional outcome.   REHAB POTENTIAL: Fair Due to severity of deficits   CLINICAL DECISION MAKING: Evolving/moderate complexity  EVALUATION COMPLEXITY: Moderate  PLAN:  PT FREQUENCY: 2x/week  PT DURATION: 6 weeks  PLANNED INTERVENTIONS: 97164- PT Re-evaluation, 97750- Physical Performance Testing, 97110-Therapeutic exercises, 97530- Therapeutic activity, [id number]- Neuromuscular re-education, 97535- Self Care, [id number]- Manual therapy, [id number]- Gait training, [phone]- Electrical stimulation (manual), [phone] (1-2 muscles), [id number] (3+ muscles)- Dry Needling, Patient/Family education, Balance training, Stair training, Joint mobilization, Spinal mobilization, Vestibular training, DME instructions, and Moist heat  PLAN FOR NEXT SESSION: Review floor transfers as needed  Work on endurance (Scifit, gait trials), upright posture, safety awareness. TURNS. Lifting. Work on standing posture/weight shifting, car transfers, reaching out of BOS   Plan to DC at end of current POC and schedule 35mo PD screens   [place] LOISE Senate, PT, DPT 09/26/2023, 2:49 PM

## 2023-09-27 ENCOUNTER — Encounter: Payer: Self-pay | Admitting: Physical Medicine & Rehabilitation

## 2023-09-27 ENCOUNTER — Encounter: Attending: Physical Medicine & Rehabilitation | Admitting: Physical Medicine & Rehabilitation

## 2023-09-27 VITALS — BP 102/66 | HR 67 | Ht 69.0 in | Wt 250.0 lb

## 2023-09-27 DIAGNOSIS — G248 Other dystonia: Secondary | ICD-10-CM | POA: Insufficient documentation

## 2023-09-27 MED ORDER — ONABOTULINUMTOXINA 100 UNITS IJ SOLR
300.0000 [IU] | Freq: Once | INTRAMUSCULAR | Status: AC
  Administered 2023-09-27: 300 [IU] via INTRAMUSCULAR

## 2023-09-27 MED ORDER — SODIUM CHLORIDE (PF) 0.9 % IJ SOLN
6.0000 mL | Freq: Once | INTRAMUSCULAR | Status: AC
  Administered 2023-09-27: 6 mL via INTRAVENOUS

## 2023-09-27 NOTE — Progress Notes (Signed)
 Botox  Injection for spasticity using ultrasound guidance  Dilution: 50 units/ml Indication: Severe spasticity which interferes with ADL,mobility and/or  hygiene and is unresponsive to medication management and other conservative care Informed consent was obtained after describing risks and benefits of the procedure with the patient. This includes bleeding, bruising, infection, excessive weakness, or medication side effects. A REMS form is on file and signed. Needle: 22 g 80mm ECHO bloc needle used for piriformis  50mm 25g used with needle EMG for Tib post and peroneus longus and Glut medius Number of units per muscle Right tibialis posterior 75 units Right peroneus longus 75 units Right gluteus medius 75 units Right Piriformis 75 Buttocks injections were done under direct ultrasound guidance, 4 Hz curvilinear transducer using ECHO block needle long axis view, the patient tolerated the procedure well. Post procedure instructions were given. A followup appointment was made.

## 2023-10-01 ENCOUNTER — Ambulatory Visit: Admitting: Physical Therapy

## 2023-10-01 VITALS — BP 111/59 | HR 72

## 2023-10-01 DIAGNOSIS — R2689 Other abnormalities of gait and mobility: Secondary | ICD-10-CM | POA: Diagnosis not present

## 2023-10-01 DIAGNOSIS — R293 Abnormal posture: Secondary | ICD-10-CM

## 2023-10-01 DIAGNOSIS — M6281 Muscle weakness (generalized): Secondary | ICD-10-CM | POA: Diagnosis not present

## 2023-10-01 DIAGNOSIS — R29818 Other symptoms and signs involving the nervous system: Secondary | ICD-10-CM

## 2023-10-01 DIAGNOSIS — R2681 Unsteadiness on feet: Secondary | ICD-10-CM | POA: Diagnosis not present

## 2023-10-01 DIAGNOSIS — Z9181 History of falling: Secondary | ICD-10-CM | POA: Diagnosis not present

## 2023-10-01 DIAGNOSIS — R278 Other lack of coordination: Secondary | ICD-10-CM

## 2023-10-01 NOTE — Therapy (Signed)
 OUTPATIENT PHYSICAL THERAPY NEURO TREATMENT   Patient Name: Henry [name] MRN: [id number] DOB:09-Oct-1950, 73 y.o., male Today's Date: 10/01/2023   PCP: Cleatus Arlyss RAMAN, MD  REFERRING PROVIDER: Carilyn Prentice BRAVO, MD   END OF SESSION:  PT End of Session - 10/01/23 1402     Visit Number 12    Number of Visits 13    Date for Recertification  10/11/23    Authorization Type Medicare    Progress Note Due on Visit --   performed on visit 9   PT Start Time 1400    PT Stop Time 1442    PT Time Calculation (min) 42 min    Equipment Utilized During Treatment Gait belt    Activity Tolerance Patient tolerated treatment well;Other (comment);Patient limited by fatigue   SOB   Behavior During Therapy [facility] for tasks assessed/performed              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)   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   Dysrhythmia    a-fib   GERD (gastroesophageal reflux disease)    Glaucoma    right eye   History of kidney stones    multiple   HLD (hyperlipidemia)    HTN (hypertension)    pt denies 08/19/12   Hyperplasia, prostate    Hypothyroidism    MI (myocardial infarction) (HCC)    Dr Pietro 2000, x3vessels bypass   Neuromuscular disorder (HCC)    Parkinson's Disease   OSA (obstructive sleep apnea)    AHI-28,on CPAP, noncompliant with CPAP   Parkinson disease (HCC)    1999   PONV (postoperative nausea and vomiting)    Restless legs    Shortness of breath    Hx: of at all times   UTI (lower urinary tract infection) 09/15/2012   Klebsiella   Ventral hernia    Walker as ambulation aid     also uses wheelchair at home/when going out   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   BIOPSY  10/14/2018   Procedure: BIOPSY;  Surgeon: Albertus Gordy HERO, MD;  Location: [facility];  Service: Gastroenterology;;   CATARACT EXTRACTION W/ INTRAOCULAR LENS IMPLANT     Hx: of right eye   CATARACT EXTRACTION W/ INTRAOCULAR LENS IMPLANT Left 2018   COLONOSCOPY N/A 10/13/2014   Procedure: COLONOSCOPY;  Surgeon: Gordy HERO Albertus, MD;  Location: [facility];  Service: Gastroenterology;  Laterality: N/A;   COLONOSCOPY W/ BIOPSIES AND POLYPECTOMY     Hx: of   COLONOSCOPY WITH PROPOFOL  N/A 10/14/2018   Procedure: COLONOSCOPY WITH PROPOFOL ;  Surgeon: Albertus Gordy HERO, MD;  Location: [facility];  Service: Gastroenterology;  Laterality: N/A;   CORONARY ARTERY BYPASS GRAFT  2000   Sudie Laine, MD   CORONARY STENT PLACEMENT  1998   DEEP BRAIN STIMULATOR PLACEMENT  2004   Right and left VIN stimulator placement (parkinsons)   EYE SURGERY  Cataract removed in 1980s related to wood in eye when 73 years old   FINGER AMPUTATION     left pointer   IMPLANTABLE CARDIOVERTER DEFIBRILLATOR IMPLANT N/A 11/13/2013   Procedure: IMPLANTABLE CARDIOVERTER DEFIBRILLATOR IMPLANT;  Surgeon: Danelle LELON Birmingham, MD;  Location: [facility] CATH LAB;  Service: Cardiovascular;  Laterality: N/A;   INSERT / REPLACE / REMOVE PACEMAKER     medtronic   LEFT AND RIGHT HEART CATHETERIZATION WITH CORONARY ANGIOGRAM N/A 09/24/2013   Procedure: LEFT AND RIGHT HEART CATHETERIZATION WITH CORONARY ANGIOGRAM;  Surgeon: Lonni JONETTA Cash, MD;  Location: [facility] CATH LAB;  Service: Cardiovascular;  Laterality: N/A;   LITHOTRIPSY     3 different times   POLYPECTOMY  10/14/2018   Procedure: POLYPECTOMY;  Surgeon: Albertus Gordy HERO, MD;  Location: [facility];  Service: Gastroenterology;;   PULSE GENERATOR IMPLANT Right 11/13/2017   Procedure: Right chest implantable pulse generator  change;  Surgeon: Unice Pac, MD;  Location: [facility] OR;  Service: Neurosurgery;  Laterality: Right;  Right chest implantable pulse generator change   SUBTHALAMIC STIMULATOR BATTERY REPLACEMENT N/A 09/05/2012   Procedure: Deep brain stimulator battery change;  Surgeon: Pac Unice, MD;  Location: [facility];  Service: Neurosurgery;  Laterality: N/A;  Deep brain stimulator battery change   SUBTHALAMIC STIMULATOR BATTERY REPLACEMENT N/A 06/10/2015   Procedure: Deep Brain stimulator battery change;  Surgeon: Pac Unice, MD;  Location: [facility];  Service: Neurosurgery;  Laterality: N/A;   SUBTHALAMIC STIMULATOR BATTERY REPLACEMENT Right 09/25/2019   Procedure: Deep brain stimulator battery change;  Surgeon: Unice Pac, MD;  Location: [facility];  Service: Neurosurgery;  Laterality: Right;   SUBTHALAMIC STIMULATOR BATTERY REPLACEMENT Right 12/15/2022   Procedure: IMPLANTABLE PULSE GENERATOR  BATTERY REPLACEMENT;  Surgeon: Carollee Lani BROCKS, DO;  Location: [facility] OR;  Service: Neurosurgery;  Laterality: Right;  3C   TONSILLECTOMY     Patient Active Problem List   Diagnosis Date Noted   Skin tear of hand without complication 08/03/2023   Delirium 05/29/2023   Healthcare maintenance 04/08/2023   Cerumen impaction 04/08/2023   Hordeolum 03/21/2023   Pancreatic cyst 03/05/2022   Lower respiratory infection 02/20/2022   Lobar pneumonia 02/20/2022   Sore throat 01/12/2022   Cyst of perianal area 01/12/2022   Strep pharyngitis 12/16/2021   Wheezing 12/16/2021   DOE (dyspnea on exertion) 12/16/2021   Parkinson's disease with EBS (electrical brain stimulation) [G20.A1] 12/05/2021   Fall at home 06/15/2021   Asthma    Skin lesion 05/30/2019   CAD (coronary artery disease) 08/25/2018   Diabetes mellitus without complication (HCC) 08/15/2018   Thrombocytopenia 07/08/2017   Gout, unspecified 06/06/2017   Dysuria 05/25/2017   Microscopic hematuria 05/25/2017   CKD (chronic kidney disease) stage 3, GFR  30-59 ml/min (HCC) 04/12/2017   Neuropathy 02/25/2017   Urinary frequency 09/19/2016   Constipation 11/27/2015   Medicare annual wellness visit, initial 09/23/2015   Advance care planning 09/23/2015   Spinal stenosis of lumbar region 01/26/2015   History of colonic polyps    Restless leg syndrome 08/11/2014   ICD- MDT, implanted 11/13/13 11/14/2013   Chronic systolic CHF (congestive heart failure) (HCC) 11/13/2013   SOB (shortness of breath), mostly on exertion 06/05/2013   Acute on chronic combined systolic and diastolic congestive heart failure, NYHA class 4 (HCC) 05/12/2013   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   Umbilical hernia 01/27/2013   Elevated PSA 12/04/2012   Other malaise and fatigue 12/04/2012   PD (Parkinson's disease) (HCC) 08/19/2012   OSA  on CPAP 04/17/2012   Dysautonomia (HCC) 04/17/2012   Obesity, morbid (HCC) 04/17/2012   Hypothyroidism following radioiodine therapy 10/16/2011   GERD 05/11/2009   Type 2 diabetes mellitus with chronic kidney disease, with long-term current use of insulin  (HCC) 04/26/2009   Back pain 08/18/2008   OTHER SPEC FORMS CHRONIC ISCHEMIC HEART DISEASE 04/24/2008   HYPERPLASIA PROSTATE UNS W/O UR OBST & OTH LUTS 09/02/2007   NEPHROLITHIASIS, HX OF 09/02/2007   Ventral hernia 12/28/2006   Hyperlipidemia 08/31/2006   HTN (hypertension) 08/31/2006   Hx of CABG x 3 in 2000 08/31/2006    ONSET DATE: 08/09/2023 (referral)   REFERRING DIAG: G24.8 (ICD-10-CM) - 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 States he has had one fall since last visit, fell getting out of the shower. Denies injuries. Was able to get into quadruped and stand up from floor w/assistance.  Received botox  in posterior chain last week, states he is feeling better with it. Wife reports pt slept most of the day yesterday and was very out of it, is doing better today.   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 No  PRECAUTIONS: Fall, ICD/Pacemaker, and Other: DBS  RED FLAGS: None   WEIGHT BEARING RESTRICTIONS: No  FALLS: Has patient fallen in last 6 months? Yes. Number of falls 2  LIVING ENVIRONMENT: Lives with: lives with their spouse and Oldest Daughter and her husband Lives in: House/apartment Stairs: Ramped entry  Has following equipment at home: Environmental consultant - 2 wheeled, Environmental consultant - 4 wheeled, Wheelchair (manual), [employer], Grab bars, and U-Step walker   PLOF: Requires assistive device for independence, Needs assistance with ADLs, Needs assistance with gait, and Needs assistance with transfers  PATIENT GOALS: I don't know I just want to keep my strength   OBJECTIVE:  Note: Objective measures were completed at Evaluation unless otherwise noted.  DIAGNOSTIC FINDINGS: None relevant  COGNITION: Overall cognitive status: History of cognitive impairments - at baseline   SENSATION: Pt denied numbness/tingling in BLEs  COORDINATION: Dystonia in LLE    POSTURE: rounded shoulders, forward head, increased thoracic kyphosis, posterior pelvic tilt, and flexed trunk   LOWER EXTREMITY ROM:     Active  Right Eval Left Eval  Hip flexion    Hip extension    Hip abduction    Hip adduction    Hip internal rotation    Hip external  rotation    Knee flexion    Knee extension    Ankle dorsiflexion    Ankle plantarflexion    Ankle inversion    Ankle eversion     (Blank rows = not tested)  LOWER EXTREMITY MMT:    MMT Right Eval Left Eval  Hip flexion    Hip extension    Hip abduction    Hip adduction    Hip internal rotation    Hip external rotation    Knee flexion    Knee extension    Ankle dorsiflexion    Ankle plantarflexion    Ankle inversion    Ankle eversion    (Blank rows = not tested)  BED MOBILITY:  Not tested Pt frequently sleeps in recliner (lift chair)   TRANSFERS: Sit to stand: CGA  Assistive device utilized: U-Step     Stand to sit: CGA and Min A  Assistive device utilized: U-step      Pt does not fully turn to sit and pushes U-Step away from body, requiring min A to ensure pt did not miss seat or fall forwards when pushing U-Step away. Pt reports he did this because he was fatigued. Pt also demonstrates poor anterior weight shift w/heavy posterior bracing on chair to stand.   RAMP:  Not tested  CURB:  Not tested  STAIRS: Not tested GAIT: Gait pattern: step to pattern, decreased stride length, decreased hip/knee flexion- Right, decreased hip/knee flexion- Left, decreased ankle dorsiflexion- Right, decreased ankle dorsiflexion- Left, shuffling, trunk flexed, wide BOS, poor foot clearance- Right, and poor foot clearance- Left Distance walked: Various short clinic distances  Assistive device utilized: U-Step  Level of assistance: CGA Comments: Pt assumes severe forward-flexed posture w/arms extended on walker. Max verbal and tactile cues provided to facilitate upright posture, which pt able to maintain for brief amount of time prior to resuming forward flexed posture.     VITALS  Vitals:   10/01/23 1407  BP: (!) 111/59  Pulse: 72                                                                                                                               TREATMENT :     Therapeutic Activity/Self-care: Pt brought in U-Step rollator to clinic today with pt with continued severe forward flexed posture with arms extended on walker, continued cues during session for more upright posture and trying to stay closer to walker, pt  able to perform very briefly with slightly improved posture, but then reverts back to significantly flexed position  Assessed vitals (see above) and WNL. Noted pt's breathing much more labored today, so closely monitored SpO2. Pt's SpO2 averaging 94% on room air today at rest, difficult to obtain reading w/activity due to dyskinesias.  SciFit multi-peaks level 9.5 for 8 minutes using BUE/BLEs for neural priming for reciprocal movement, dynamic cardiovascular warmup and increased amplitude of stepping. RPE of 8/10 following activity.  Reviewed pt's HEP w/emphasis on seated deadlift as pt's wife reports pt cannot remember how to do this at home. Had pt demonstrate w/12# KB and pt demonstrated a deadlift high pull. Informed pt and wife that pt may perform this, but this movement uses more UE strength compared to a traditional deadlift w/uses more posterior chain. Pt and wife verbalized understanding.  Pt requesting to try a standing exercise that he saw on YouTube (standing w/BUEs in 90 degrees of flexion). Pt able to perform w/CGA in clinic but fatigued quickly. Informed pt and wife that if pt performs at home, it MUST be in front of a counter w/a sturdy chair behind him, NOT the toilet (pt likes to do his exercises on the toilet). Pt and wife verbalized understanding.   Informed pt it is imperative that he announce his need to sit prior to sitting down, as he has attempted to sit on floor in clinic and this is how he had his most recent fall at home. Pt verbalized understanding.  Reviewed plan to DC next session and schedule 26mo screens. Pt and wife in agreement.   PATIENT EDUCATION: Education details: review of HEP, importance of safety at home and  announcing need to sit prior to sitting, plan to DC next session  Person educated: Patient and Spouse Education method: Explanation, Demonstration, Tactile cues, and Verbal cues Education comprehension: verbalized understanding, returned demonstration, verbal cues required, tactile cues required, and needs further education  HOME EXERCISE PROGRAM: Seated PWR moves   Access Code: [email or web address] URL: [email or web address] Date: 09/05/2023 Prepared by: Marlon Nikola Blackston  Program Notes You may perform seated deadlifts with your 5lb kettlebell, 3 sets of 10-15 repetitions   Exercises - Seated Shoulder Horizontal Abduction with Resistance  - 1 x daily - 7 x weekly - 2-3 sets - 10 reps - 3-5 seconds hold  GOALS: Goals reviewed with patient? Yes  SHORT TERM GOALS: Target date: 09/20/2023   Pt will perform floor transfer w/min A from wife for improved fall recovery and safety at home  Baseline: wife reports she can get pt up if he gets into quadruped, but has a hard time with this  Goal status: PARTIALLY MET  2.  Pt will improve 5 x STS to less than or equal to 20 seconds with proper body mechanics and BUE support to demonstrate improved functional strength and transfer efficiency.   Baseline: 17.34s w/BUE support and improper body mechanics bracing against the chair   14.7 seconds with BUE support from chair, no BLE bracing against chair (9/10) Goal status: MET  3.  TUG to be assessed and LTG updated  Baseline:  Goal status: MET  4.  Pt will improve gait velocity to at least 1.85 ft/s w/U-Step for improved gait efficiency and reduced fall risk   Baseline: 1.64 ft/s w/U-Step   16.5 seconds with U-Step rollator = 1.99 ft/sec (9/10) Goal status: MET   LONG TERM GOALS: Target date: 10/04/2023   Pt will perform final HEP w/min A from wife for  improved strength, balance, transfers and gait. Baseline:  Goal status: INITIAL  2.  Pt will improve gait velocity to at least 2.2  ft/s w/U-Step for improved gait efficiency and endurance  Baseline: 1.64 ft/s w/U-Step   16.5 seconds with U-Step rollator = 1.99 ft/sec (9/10) Goal status: REVISED  3.  Pt will improve normal TUG to less than or equal to 17 seconds for improved functional mobility and decreased fall risk.  Baseline: 20.03s w/U-step and CGA Goal status: REVISED   4.  Pt will verbalize understanding of local PD community resources, including fitness post DC.  Baseline:  Goal status: INITIAL  ASSESSMENT:  CLINICAL IMPRESSION: Emphasis of skilled PT session on reviewing pt's HEP, reciprocal coordination and pt/caregiver education on safety at home. Pt's breathing more labored today but SpO2 maintained around 94%. Pt requesting to perform standing exercise that he saw on YouTube, so reviewed this w/pt and wife and provided recommendations for performing safely at home. Pt has tendency to impulsively sit down while ambulating without announcing need to sit, resulting in pt lowering to floor.  This occurred last week in the shower at home. Informed pt he needs to announce his need to sit down prior to initialing sit for safety and fall prevention. Pt and wife in agreement to DC from PT next session and schedule 30mo PD screens. Continue POC.     OBJECTIVE IMPAIRMENTS: Abnormal gait, decreased activity tolerance, decreased balance, decreased cognition, decreased coordination, decreased endurance, decreased knowledge of use of DME, decreased mobility, difficulty walking, decreased strength, decreased safety awareness, impaired perceived functional ability, increased muscle spasms, improper body mechanics, and postural dysfunction.   ACTIVITY LIMITATIONS: carrying, lifting, sitting, standing, transfers, bed mobility, continence, bathing, dressing, reach over head, hygiene/grooming, locomotion level, and caring for others  PARTICIPATION LIMITATIONS: meal prep, cleaning, laundry, medication management, personal  finances, interpersonal relationship, driving, shopping, community activity, and yard work  PERSONAL FACTORS: Behavior pattern, Fitness, Past/current experiences, and 1-2 comorbidities: PD and dysotnia in LLE are also affecting patient's functional outcome.   REHAB POTENTIAL: Fair Due to severity of deficits   CLINICAL DECISION MAKING: Evolving/moderate complexity  EVALUATION COMPLEXITY: Moderate  PLAN:  PT FREQUENCY: 2x/week  PT DURATION: 6 weeks  PLANNED INTERVENTIONS: 97164- PT Re-evaluation, 97750- Physical Performance Testing, 97110-Therapeutic exercises, 97530- Therapeutic activity, [id number]- Neuromuscular re-education, 97535- Self Care, [id number]- Manual therapy, [id number]- Gait training, [phone]- Electrical stimulation (manual), [phone] (1-2 muscles), [id number] (3+ muscles)- Dry Needling, Patient/Family education, Balance training, Stair training, Joint mobilization, Spinal mobilization, Vestibular training, DME instructions, and Moist heat  PLAN FOR NEXT SESSION: Goals and DC. Schedule 30mo PD screens.     Tien Aispuro E Saintclair Schroader, PT, DPT 10/01/2023, 3:52 PM

## 2023-10-03 ENCOUNTER — Encounter: Payer: Self-pay | Admitting: Physical Therapy

## 2023-10-03 ENCOUNTER — Ambulatory Visit: Admitting: Physical Therapy

## 2023-10-03 DIAGNOSIS — R293 Abnormal posture: Secondary | ICD-10-CM | POA: Diagnosis not present

## 2023-10-03 DIAGNOSIS — R29818 Other symptoms and signs involving the nervous system: Secondary | ICD-10-CM

## 2023-10-03 DIAGNOSIS — R2681 Unsteadiness on feet: Secondary | ICD-10-CM

## 2023-10-03 DIAGNOSIS — M6281 Muscle weakness (generalized): Secondary | ICD-10-CM | POA: Diagnosis not present

## 2023-10-03 DIAGNOSIS — Z9181 History of falling: Secondary | ICD-10-CM | POA: Diagnosis not present

## 2023-10-03 DIAGNOSIS — R2689 Other abnormalities of gait and mobility: Secondary | ICD-10-CM | POA: Diagnosis not present

## 2023-10-03 NOTE — Therapy (Signed)
 OUTPATIENT PHYSICAL THERAPY NEURO TREATMENT/DISCHARGE SUMMARY   Patient Name: Henry [name] MRN: [id number] DOB:1950/08/10, 73 y.o., male Today's Date: 10/03/2023   PCP: Cleatus Arlyss RAMAN, MD  REFERRING PROVIDER: Carilyn Prentice BRAVO, MD   END OF SESSION:  PT End of Session - 10/03/23 1453     Visit Number 13    Number of Visits 13    Date for Recertification  10/11/23    Authorization Type Medicare    Progress Note Due on Visit --   performed on visit 9   PT Start Time 1452   pt in restroom   PT Stop Time 1526   full time not used due to D/C visit   PT Time Calculation (min) 34 min    Equipment Utilized During Treatment Gait belt    Activity Tolerance Patient tolerated treatment well    Behavior During Therapy WFL for tasks assessed/performed              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)   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   Dysrhythmia    a-fib   GERD (gastroesophageal reflux disease)    Glaucoma    right eye   History of kidney stones    multiple   HLD (hyperlipidemia)    HTN (hypertension)    pt denies 08/19/12   Hyperplasia, prostate    Hypothyroidism    MI (myocardial infarction) (HCC)    Dr Pietro 2000, x3vessels bypass   Neuromuscular disorder (HCC)    Parkinson's Disease   OSA (obstructive sleep apnea)    AHI-28,on CPAP, noncompliant with CPAP   Parkinson disease (HCC)    1999   PONV (postoperative nausea and vomiting)    Restless legs    Shortness of breath    Hx: of at all times   UTI (lower urinary tract infection) 09/15/2012   Klebsiella   Ventral hernia     Walker as ambulation aid    also uses wheelchair at home/when going out   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   BIOPSY  10/14/2018   Procedure: BIOPSY;  Surgeon: Albertus Gordy HERO, MD;  Location: [facility];  Service: Gastroenterology;;   CATARACT EXTRACTION W/ INTRAOCULAR LENS IMPLANT     Hx: of right eye   CATARACT EXTRACTION W/ INTRAOCULAR LENS IMPLANT Left 2018   COLONOSCOPY N/A 10/13/2014   Procedure: COLONOSCOPY;  Surgeon: Gordy HERO Albertus, MD;  Location: [facility];  Service: Gastroenterology;  Laterality: N/A;   COLONOSCOPY W/ BIOPSIES AND POLYPECTOMY     Hx: of   COLONOSCOPY WITH PROPOFOL  N/A 10/14/2018   Procedure: COLONOSCOPY WITH PROPOFOL ;  Surgeon: Albertus Gordy HERO, MD;  Location: [facility];  Service: Gastroenterology;  Laterality: N/A;   CORONARY ARTERY BYPASS GRAFT  2000   Sudie Laine, MD   CORONARY STENT PLACEMENT  1998   DEEP BRAIN STIMULATOR PLACEMENT  2004   Right and left VIN  stimulator placement (parkinsons)   EYE SURGERY     Cataract removed in 1980s related to wood in eye when 73 years old   FINGER AMPUTATION     left pointer   IMPLANTABLE CARDIOVERTER DEFIBRILLATOR IMPLANT N/A 11/13/2013   Procedure: IMPLANTABLE CARDIOVERTER DEFIBRILLATOR IMPLANT;  Surgeon: Danelle LELON Birmingham, MD;  Location: [facility] CATH LAB;  Service: Cardiovascular;  Laterality: N/A;   INSERT / REPLACE / REMOVE PACEMAKER     medtronic   LEFT AND RIGHT HEART CATHETERIZATION WITH CORONARY ANGIOGRAM N/A 09/24/2013   Procedure: LEFT AND RIGHT HEART CATHETERIZATION WITH CORONARY ANGIOGRAM;  Surgeon: Lonni JONETTA Cash, MD;  Location: [facility] CATH LAB;  Service: Cardiovascular;  Laterality: N/A;   LITHOTRIPSY     3 different times   POLYPECTOMY  10/14/2018   Procedure: POLYPECTOMY;  Surgeon: Albertus Gordy HERO, MD;  Location: [facility];  Service: Gastroenterology;;   PULSE GENERATOR IMPLANT Right 11/13/2017   Procedure: Right chest  implantable pulse generator change;  Surgeon: Unice Pac, MD;  Location[facility];  Service: Neurosurgery;  Laterality: Right;  Right chest implantable pulse generator change   SUBTHALAMIC STIMULATOR BATTERY REPLACEMENT N/A 09/05/2012   Procedure: Deep brain stimulator battery change;  Surgeon: Pac Unice, MD;  Location: [facility]URO [facility]S;  Service: Neurosurgery;  Laterality: N/A;  Deep brain stimulator battery change   SUBTHALAMIC STIMULATOR BATTERY REPLACEMENT N/A 06/10/2015   Procedure: Deep Brain stimulator battery change;  Surgeon: Pac Unice, MD;  Location: [facility];  Service: Neurosurgery;  Laterality: N/A;   SUBTHALAMIC STIMULATOR BATTERY REPLACEMENT Right 09/25/2019   Procedure: Deep brain stimulator battery change;  Surgeon: Unice Pac, MD;  Location: [facility] OR;  Service: Neurosurgery;  Laterality: Right;   SUBTHALAMIC STIMULATOR BATTERY REPLACEMENT Right 12/15/2022   Procedure: IMPLANTABLE PULSE GENERATOR  BATTERY REPLACEMENT;  Surgeon: Carollee Lani BROCKS, DO;  Location: [facility] OR;  Service: Neurosurgery;  Laterality: Right;  3C   TONSILLECTOMY     Patient Active Problem List   Diagnosis Date Noted   Skin tear of hand without complication 08/03/2023   Delirium 05/29/2023   Healthcare maintenance 04/08/2023   Cerumen impaction 04/08/2023   Hordeolum 03/21/2023   Pancreatic cyst 03/05/2022   Lower respiratory infection 02/20/2022   Lobar pneumonia 02/20/2022   Sore throat 01/12/2022   Cyst of perianal area 01/12/2022   Strep pharyngitis 12/16/2021   Wheezing 12/16/2021   DOE (dyspnea on exertion) 12/16/2021   Parkinson's disease with EBS (electrical brain stimulation) [G20.A1] 12/05/2021   Fall at home 06/15/2021   Asthma    Skin lesion 05/30/2019   CAD (coronary artery disease) 08/25/2018   Diabetes mellitus without complication (HCC) 08/15/2018   Thrombocytopenia 07/08/2017   Gout, unspecified 06/06/2017   Dysuria 05/25/2017   Microscopic hematuria 05/25/2017   CKD (chronic  kidney disease) stage 3, GFR 30-59 ml/min (HCC) 04/12/2017   Neuropathy 02/25/2017   Urinary frequency 09/19/2016   Constipation 11/27/2015   Medicare annual wellness visit, initial 09/23/2015   Advance care planning 09/23/2015   Spinal stenosis of lumbar region 01/26/2015   History of colonic polyps    Restless leg syndrome 08/11/2014   ICD- MDT, implanted 11/13/13 11/14/2013   Chronic systolic CHF (congestive heart failure) (HCC) 11/13/2013   SOB (shortness of breath), mostly on exertion 06/05/2013   Acute on chronic combined systolic and diastolic congestive heart failure, NYHA class 4 (HCC) 05/12/2013   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   Umbilical hernia 01/27/2013   Elevated PSA 12/04/2012   Other malaise and fatigue  12/04/2012   PD (Parkinson's disease) (HCC) 08/19/2012   OSA on CPAP 04/17/2012   Dysautonomia (HCC) 04/17/2012   Obesity, morbid (HCC) 04/17/2012   Hypothyroidism following radioiodine therapy 10/16/2011   GERD 05/11/2009   Type 2 diabetes mellitus with chronic kidney disease, with long-term current use of insulin  (HCC) 04/26/2009   Back pain 08/18/2008   OTHER SPEC FORMS CHRONIC ISCHEMIC HEART DISEASE 04/24/2008   HYPERPLASIA PROSTATE UNS W/O UR OBST & OTH LUTS 09/02/2007   NEPHROLITHIASIS, HX OF 09/02/2007   Ventral hernia 12/28/2006   Hyperlipidemia 08/31/2006   HTN (hypertension) 08/31/2006   Hx of CABG x 3 in 2000 08/31/2006    ONSET DATE: 08/09/2023 (referral)   REFERRING DIAG: G24.8 (ICD-10-CM) - 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 Feels pumped up after his botox . No new falls. Normally does his exercises after lunch.   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 No  PRECAUTIONS: Fall, ICD/Pacemaker, and Other: DBS  RED FLAGS: None   WEIGHT BEARING RESTRICTIONS: No  FALLS: Has patient fallen in last 6 months? Yes. Number of falls 2  LIVING ENVIRONMENT: Lives with: lives with their spouse and Oldest Daughter and her husband Lives in: House/apartment Stairs: Ramped entry  Has following equipment at home: Environmental consultant - 2 wheeled, Environmental consultant - 4 wheeled, Wheelchair (manual), [employer], Grab bars, and U-Step walker   PLOF: Requires assistive device for independence, Needs assistance with ADLs, Needs assistance with gait, and Needs assistance with transfers  PATIENT GOALS: I don't know I just want to keep my strength   OBJECTIVE:  Note: Objective measures were completed at Evaluation unless otherwise noted.  DIAGNOSTIC FINDINGS: None relevant   COGNITION: Overall cognitive status: History of cognitive impairments - at baseline   SENSATION: Pt denied numbness/tingling in BLEs  COORDINATION: Dystonia in LLE    POSTURE: rounded shoulders, forward head, increased thoracic kyphosis, posterior pelvic tilt, and  flexed trunk   LOWER EXTREMITY ROM:     Active  Right Eval Left Eval  Hip flexion    Hip extension    Hip abduction    Hip adduction    Hip internal rotation    Hip external rotation    Knee flexion    Knee extension    Ankle dorsiflexion    Ankle plantarflexion    Ankle inversion    Ankle eversion     (Blank rows = not tested)  LOWER EXTREMITY MMT:    MMT Right Eval Left Eval  Hip flexion    Hip extension    Hip  abduction    Hip adduction    Hip internal rotation    Hip external rotation    Knee flexion    Knee extension    Ankle dorsiflexion    Ankle plantarflexion    Ankle inversion    Ankle eversion    (Blank rows = not tested)  BED MOBILITY:  Not tested Pt frequently sleeps in recliner (lift chair)   TRANSFERS: Sit to stand: CGA  Assistive device utilized: U-Step     Stand to sit: CGA and Min A  Assistive device utilized: U-step      Pt does not fully turn to sit and pushes U-Step away from body, requiring min A to ensure pt did not miss seat or fall forwards when pushing U-Step away. Pt reports he did this because he was fatigued. Pt also demonstrates poor anterior weight shift w/heavy posterior bracing on chair to stand.   RAMP:  Not tested  CURB:  Not tested  STAIRS: Not tested GAIT: Gait pattern: step to pattern, decreased stride length, decreased hip/knee flexion- Right, decreased hip/knee flexion- Left, decreased ankle dorsiflexion- Right, decreased ankle dorsiflexion- Left, shuffling, trunk flexed, wide BOS, poor foot clearance- Right, and poor foot clearance- Left Distance walked: Various short clinic distances  Assistive device utilized: U-Step  Level of assistance: CGA Comments: Pt assumes severe forward-flexed posture w/arms extended on walker. Max verbal and tactile cues provided to facilitate upright posture, which pt able to maintain for brief amount of time prior to resuming forward flexed posture.     VITALS  There were no vitals filed for this visit.                                                                                                                              TREATMENT :    Therapeutic Activity Pt brought in U-Step rollator to clinic today with pt with continued severe forward flexed posture with arms extended on walker, continued cues during session for more upright posture and trying to stay closer to walker, pt able to perform very briefly  with slightly improved posture, but then reverts back to significantly flexed position  Reviewed making sure pt focuses on safety with bed mobility (performing log roll technique instead of climbing in), transfers (widened BOS and not pulling up on  rollator) and making sure pt turns all the way around and reaches behind when going to sit on a surface Discussed will schedule PD screens in 4 months (as pt currently in OT right now) and educated on purpose of these, pt's spouse and pt in agreement   Goal Assessment:  TUG: 19.6 seconds with U-Step and CGA Gait speed with U-Step rollator: 2.16 ft/sec  Reviewed PWR moves for HEP per pt request:  Pt performs PWR! Moves in seated position:    PWR! Up for improved posture 2 x 10 reps, cues to look up to a target at the ceiling    PWR! Rock for improved weight shifting x10 reps    PWR! Twist for improved trunk rotation x10 reps, cues to reset tall in the middle, pt with decr trunk rotation to the R side    PWR! Step for improved step initiation x10 reps single step in and out, pt having more difficulty coordinating with stepping both legs out    Cues provided for technique, larger amplitude movement, and looking at hands with head/eyes when reaching  Pt reporting working towards a RPE of 6-7/10   OT in clinic video taped PT (with PT's permission) on pt's spouse's phone for reminder cues going forwards when performing at home   PATIENT EDUCATION: Education details: D/C from PT, see therapeutic activity section above   Person educated: Patient and Spouse Education method: Explanation, Demonstration, Tactile cues, and Verbal cues Education comprehension: verbalized understanding, returned demonstration, verbal cues required, tactile cues required, and needs further education  HOME EXERCISE PROGRAM: Seated PWR moves   Access Code: [email or web address] URL: [email or web address] Date: 10/03/2023 Prepared by: Sheffield Senate  Program  Notes You may perform seated deadlifts with your 5lb kettlebell, 3 sets of 10-15 repetitions   Exercises - Seated Shoulder Horizontal Abduction with Resistance  - 1 x daily - 7 x weekly - 2-3 sets - 10 reps - 3-5 seconds hold - Sit to Stand with Armchair  - 2 x daily - 7 x weekly - 1 sets - 10 reps  PHYSICAL THERAPY DISCHARGE SUMMARY  Visits from Start of Care: 13  Current functional level related to goals / functional outcomes: See LTGs/Clinical Assessment Statement    Remaining deficits: Decr strength, impaired posture, bradykinesia, decr safety awareness, decr endurance, gait abnormalities, impaired balance    Education / Equipment: HEP, PD education, fall prevention    Patient agrees to discharge. Patient goals were partially met. Patient is being discharged due to maximized rehab potential.  Will be scheduled for PD screens in 4 months    GOALS: Goals reviewed with patient? Yes  SHORT TERM GOALS: Target date: 09/20/2023   Pt will perform floor transfer w/min A from wife for improved fall recovery and safety at home  Baseline: wife reports she can get pt up if he gets into quadruped, but has a hard time with this  Goal status: PARTIALLY MET  2.  Pt will improve 5 x STS to less than or equal to 20 seconds with proper body mechanics and BUE support to demonstrate improved functional strength and transfer efficiency.   Baseline: 17.34s w/BUE support and improper body mechanics bracing against the chair   14.7 seconds with BUE support from chair, no BLE bracing against chair (9/10) Goal status: MET  3.  TUG to be assessed and LTG updated  Baseline:  Goal status: MET  4.  Pt will improve gait velocity to at least 1.85 ft/s w/U-Step for improved gait efficiency  and reduced fall risk   Baseline: 1.64 ft/s w/U-Step   16.5 seconds with U-Step rollator = 1.99 ft/sec (9/10) Goal status: MET   LONG TERM GOALS: Target date: 10/04/2023   Pt will perform final HEP w/min A  from wife for improved strength, balance, transfers and gait. Baseline: reviewed final HEP on 10/03/23 Goal status: MET   2.  Pt will improve gait velocity to at least 2.2 ft/s w/U-Step for improved gait efficiency and endurance  Baseline: 1.64 ft/s w/U-Step   16.5 seconds with U-Step rollator = 1.99 ft/sec (9/10)  Gait speed with U-Step rollator: 2.16 ft/sec Goal status: MET  3.  Pt will improve normal TUG to less than or equal to 17 seconds for improved functional mobility and decreased fall risk.  Baseline: 20.03s w/U-step and CGA  19.6 seconds with U-Step and CGA Goal status:NOT MET  4.  Pt will verbalize understanding of local PD community resources, including fitness post DC.  Baseline:  Goal status: INITIAL  ASSESSMENT:  CLINICAL IMPRESSION: Today's skilled session focused on assessing pt's LTGs. Reviewed HEP with pt and pt's spouse and went over technique for seated PWR moves for larger amplitude movements and flexibility. With PT permission, they used pt's spouse's phone to videotape PT doing the PWR moves for incr carryover at home going forwards. Pt met goal with gait speed with U-Step rollator, however pt continues to perform with incr forward flexed posture and arms extended on U-Step and pushing it too far anteriorly. PT has attempted throughout POC to give cues for pt to stay upright and maintain closer to walker, but pt does not respond well to these cues. Pt did not meet goal in regards to TUG, performed in 19.6 seconds with CGA (previously was 20 seconds). PT has provided extensive safety education with fall prevention, gait transfers, and bed mobility and pt has reached has maximum potential with OPPT at this time. Pt will be discharged with plan for PD screens in 4 months. Pt and pt's spouse in agreement with plan.      OBJECTIVE IMPAIRMENTS: Abnormal gait, decreased activity tolerance, decreased balance, decreased cognition, decreased coordination, decreased  endurance, decreased knowledge of use of DME, decreased mobility, difficulty walking, decreased strength, decreased safety awareness, impaired perceived functional ability, increased muscle spasms, improper body mechanics, and postural dysfunction.   ACTIVITY LIMITATIONS: carrying, lifting, sitting, standing, transfers, bed mobility, continence, bathing, dressing, reach over head, hygiene/grooming, locomotion level, and caring for others  PARTICIPATION LIMITATIONS: meal prep, cleaning, laundry, medication management, personal finances, interpersonal relationship, driving, shopping, community activity, and yard work  PERSONAL FACTORS: Behavior pattern, Fitness, Past/current experiences, and 1-2 comorbidities: PD and dysotnia in LLE are also affecting patient's functional outcome.   REHAB POTENTIAL: Fair Due to severity of deficits   CLINICAL DECISION MAKING: Evolving/moderate complexity  EVALUATION COMPLEXITY: Moderate  PLAN:  PT FREQUENCY: 2x/week  PT DURATION: 6 weeks  PLANNED INTERVENTIONS: 97164- PT Re-evaluation, 97750- Physical Performance Testing, 97110-Therapeutic exercises, 97530- Therapeutic activity, [id number]- Neuromuscular re-education, 97535- Self Care, [id number]- Manual therapy, [id number]- Gait training, [phone]- Electrical stimulation (manual), [phone] (1-2 muscles), 20561 (3+ muscles)- Dry Needling, Patient/Family education, Balance training, Stair training, Joint mobilization, Spinal mobilization, Vestibular training, DME instructions, and Moist heat  PLAN FOR NEXT SESSION: D/C   Sheffield LOISE Senate, PT, DPT 10/03/2023, 4:39 PM

## 2023-10-09 ENCOUNTER — Ambulatory Visit: Admitting: Occupational Therapy

## 2023-10-09 ENCOUNTER — Encounter: Payer: Self-pay | Admitting: Occupational Therapy

## 2023-10-09 DIAGNOSIS — R293 Abnormal posture: Secondary | ICD-10-CM | POA: Diagnosis not present

## 2023-10-09 DIAGNOSIS — R29818 Other symptoms and signs involving the nervous system: Secondary | ICD-10-CM

## 2023-10-09 DIAGNOSIS — R2689 Other abnormalities of gait and mobility: Secondary | ICD-10-CM | POA: Diagnosis not present

## 2023-10-09 DIAGNOSIS — R2681 Unsteadiness on feet: Secondary | ICD-10-CM | POA: Diagnosis not present

## 2023-10-09 DIAGNOSIS — Z9181 History of falling: Secondary | ICD-10-CM | POA: Diagnosis not present

## 2023-10-09 DIAGNOSIS — R29898 Other symptoms and signs involving the musculoskeletal system: Secondary | ICD-10-CM

## 2023-10-09 DIAGNOSIS — R41844 Frontal lobe and executive function deficit: Secondary | ICD-10-CM

## 2023-10-09 DIAGNOSIS — R278 Other lack of coordination: Secondary | ICD-10-CM

## 2023-10-09 DIAGNOSIS — M6281 Muscle weakness (generalized): Secondary | ICD-10-CM | POA: Diagnosis not present

## 2023-10-09 DIAGNOSIS — R41840 Attention and concentration deficit: Secondary | ICD-10-CM

## 2023-10-09 NOTE — Patient Instructions (Signed)
  Bag Exercises:  Small trash bag or produce bag works best.  For all exercises, sit with big posture (sit up tall with head up) and use big movements. Perform the following exercises 2 times per day.  Hold bag in one hand. Stretch both arms/hands out to the side as big as you can. Then, pass bag from one hand to the other BEHIND you. Stretch arms back out big after each pass. Repeat 10 times.  Hold bag in both hands in front of you with hands/arms shoulder length apart. Move bag behind your head. Repeat 10 times.  Hold bag in both hands in front of you with hands/arms shoulder length apart. Lift leg and move bag completely under each foot and back. Repeat 10 times on each side. Alternative options: using longer bag, lifting leg over trashcan turned on it's side (to front, and then over to side), tapping toe to top of chair

## 2023-10-09 NOTE — Therapy (Signed)
 OUTPATIENT OCCUPATIONAL THERAPY [facility]  Patient Name: Henry [name] MRN: [id number] DOB:[birth year], 73 y.o., male Today's Date: 10/09/2023  PCP: Cleatus Arlyss RAMAN, MD REFERRING PROVIDER: Carilyn Prentice BRAVO, MD   END OF SESSION:  OT End of Session - 10/09/23 1321     Visit Number 2    Number of Visits 16    Date for Recertification  11/26/23    Authorization Type MCR    Progress Note Due on Visit 10    OT Start Time 1320    OT Stop Time 1400    OT Time Calculation (min) 40 min    Equipment Utilized During Treatment U-step    Activity Tolerance Patient tolerated treatment well    Behavior During Therapy WFL for tasks assessed/performed          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)   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   Dysrhythmia    a-fib   GERD (gastroesophageal reflux disease)    Glaucoma    right eye   History of kidney stones    multiple   HLD (hyperlipidemia)    HTN (hypertension)    pt denies 08/19/12   Hyperplasia, prostate    Hypothyroidism    MI (myocardial infarction) (HCC)    Dr Pietro 2000, x3vessels bypass   Neuromuscular disorder (HCC)    Parkinson's Disease   OSA (obstructive sleep apnea)    AHI-28,on CPAP, noncompliant with CPAP   Parkinson disease (HCC)    1999   PONV (postoperative nausea and vomiting)    Restless legs    Shortness of breath    Hx: of at all times   UTI (lower urinary tract infection) 09/15/2012   Klebsiella   Ventral hernia    Walker as ambulation aid    also uses wheelchair at home/when going out   Past Surgical History:   Procedure Laterality Date   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   BIOPSY  10/14/2018   Procedure: BIOPSY;  Surgeon: Albertus Gordy HERO, MD;  Location: [facility];  Service: Gastroenterology;;   CATARACT EXTRACTION W/ INTRAOCULAR LENS IMPLANT     Hx: of right eye   CATARACT EXTRACTION W/ INTRAOCULAR LENS IMPLANT Left 2018   COLONOSCOPY N/A 10/13/2014   Procedure: COLONOSCOPY;  Surgeon: Gordy HERO Albertus, MD;  Location: [facility];  Service: Gastroenterology;  Laterality: N/A;   COLONOSCOPY W/ BIOPSIES AND POLYPECTOMY     Hx: of   COLONOSCOPY WITH PROPOFOL  N/A 10/14/2018   Procedure: COLONOSCOPY WITH PROPOFOL ;  Surgeon: Albertus Gordy HERO, MD;  Location: [facility];  Service: Gastroenterology;  Laterality: N/A;   CORONARY ARTERY BYPASS GRAFT  2000   Sudie Laine, MD   CORONARY STENT PLACEMENT  1998   DEEP BRAIN STIMULATOR PLACEMENT  2004   Right and left VIN stimulator placement (parkinsons)   EYE SURGERY     Cataract removed in 1980s related to wood in eye when 73 years old   FINGER  AMPUTATION     left pointer   IMPLANTABLE CARDIOVERTER DEFIBRILLATOR IMPLANT N/A 11/13/2013   Procedure: IMPLANTABLE CARDIOVERTER DEFIBRILLATOR IMPLANT;  Surgeon: Danelle LELON Birmingham, MD;  Location: [facility];  Service: Cardiovascular;  Laterality: N/A;   INSERT / REPLACE / REMOVE PACEMAKER     medtronic   LEFT AND RIGHT HEART CATHETERIZATION WITH CORONARY ANGIOGRAM N/A 09/24/2013   Procedure: LEFT AND RIGHT HEART CATHETERIZATION WITH CORONARY ANGIOGRAM;  Surgeon: Lonni JONETTA Cash, MD;  Location: [facility] CATH LAB;  Service: Cardiovascular;  Laterality: N/A;   LITHOTRIPSY     3 different times   POLYPECTOMY  10/14/2018   Procedure: POLYPECTOMY;  Surgeon: Albertus Gordy HERO, MD;  Location: [facility];  Service: Gastroenterology;;   PULSE GENERATOR IMPLANT Right 11/13/2017   Procedure: Right chest implantable pulse generator change;  Surgeon: Unice Pac, MD;  Location: [facility] OR;  Service: Neurosurgery;   Laterality: Right;  Right chest implantable pulse generator change   SUBTHALAMIC STIMULATOR BATTERY REPLACEMENT N/A 09/05/2012   Procedure: Deep brain stimulator battery change;  Surgeon: Pac Unice, MD;  Location: [facility];  Service: Neurosurgery;  Laterality: N/A;  Deep brain stimulator battery change   SUBTHALAMIC STIMULATOR BATTERY REPLACEMENT N/A 06/10/2015   Procedure: Deep Brain stimulator battery change;  Surgeon: Pac Unice, MD;  Location: [facility];  Service: Neurosurgery;  Laterality: N/A;   SUBTHALAMIC STIMULATOR BATTERY REPLACEMENT Right 09/25/2019   Procedure: Deep brain stimulator battery change;  Surgeon: Unice Pac, MD;  Location: [facility] OR;  Service: Neurosurgery;  Laterality: Right;   SUBTHALAMIC STIMULATOR BATTERY REPLACEMENT Right 12/15/2022   Procedure: IMPLANTABLE PULSE GENERATOR  BATTERY REPLACEMENT;  Surgeon: Carollee Lani BROCKS, DO;  Location: [facility] OR;  Service: Neurosurgery;  Laterality: Right;  3C   TONSILLECTOMY     Patient Active Problem List   Diagnosis Date Noted   Skin tear of hand without complication 08/03/2023   Delirium 05/29/2023   Healthcare maintenance 04/08/2023   Cerumen impaction 04/08/2023   Hordeolum 03/21/2023   Pancreatic cyst 03/05/2022   Lower respiratory infection 02/20/2022   Lobar pneumonia 02/20/2022   Sore throat 01/12/2022   Cyst of perianal area 01/12/2022   Strep pharyngitis 12/16/2021   Wheezing 12/16/2021   DOE (dyspnea on exertion) 12/16/2021   Parkinson's disease with EBS (electrical brain stimulation) [G20.A1] 12/05/2021   Fall at home 06/15/2021   Asthma    Skin lesion 05/30/2019   CAD (coronary artery disease) 08/25/2018   Diabetes mellitus without complication (HCC) 08/15/2018   Thrombocytopenia 07/08/2017   Gout, unspecified 06/06/2017   Dysuria 05/25/2017   Microscopic hematuria 05/25/2017   CKD (chronic kidney disease) stage 3, GFR 30-59 ml/min (HCC) 04/12/2017   Neuropathy 02/25/2017   Urinary frequency  09/19/2016   Constipation 11/27/2015   Medicare annual wellness visit, initial 09/23/2015   Advance care planning 09/23/2015   Spinal stenosis of lumbar region 01/26/2015   History of colonic polyps    Restless leg syndrome 08/11/2014   ICD- MDT, implanted 11/13/13 11/14/2013   Chronic systolic CHF (congestive heart failure) (HCC) 11/13/2013   SOB (shortness of breath), mostly on exertion 06/05/2013   Acute on chronic combined systolic and diastolic congestive heart failure, NYHA class 4 (HCC) 05/12/2013   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   Umbilical hernia 01/27/2013   Elevated PSA 12/04/2012   Other malaise and fatigue 12/04/2012   PD (Parkinson's disease) (HCC) 08/19/2012   OSA on CPAP 04/17/2012   Dysautonomia (HCC) 04/17/2012   Obesity, morbid (HCC) 04/17/2012  Hypothyroidism following radioiodine therapy 10/16/2011   GERD 05/11/2009   Type 2 diabetes mellitus with chronic kidney disease, with long-term current use of insulin  (HCC) 04/26/2009   Back pain 08/18/2008   OTHER SPEC FORMS CHRONIC ISCHEMIC HEART DISEASE 04/24/2008   HYPERPLASIA PROSTATE UNS W/O UR OBST & OTH LUTS 09/02/2007   NEPHROLITHIASIS, HX OF 09/02/2007   Ventral hernia 12/28/2006   Hyperlipidemia 08/31/2006   HTN (hypertension) 08/31/2006   Hx of CABG x 3 in 2000 08/31/2006    ONSET DATE: 09/03/2023 (referral date)   REFERRING DIAG: G20.B2 (ICD-10-CM) - Parkinson's disease with dyskinesia and fluctuating manifestations (HCC)   THERAPY DIAG:  Unsteadiness on feet  Abnormal posture  Other symptoms and signs involving the nervous system  Other lack of coordination  Other symptoms and signs involving the musculoskeletal system  Attention and concentration deficit  Frontal lobe and executive function deficit  Rationale for Evaluation and Treatment: Rehabilitation  SUBJECTIVE:   SUBJECTIVE STATEMENT: Goes by [facility] I can't hear Pt had fall down to Rt knee last night Pt accompanied by:  spouse Nadia)   PERTINENT HISTORY: AICD present, CAD, deaf in R ear, DM II, GERD, MI, PD (1999), DBS (2003)    PRECAUTIONS: Fall, ICD/Pacemaker, and Other: DBS   WEIGHT BEARING RESTRICTIONS: No  PAIN:  Are you having pain? No  FALLS: Has patient fallen in last 6 months? Yes. Number of falls 7  LIVING ENVIRONMENT: Lives with: lives with their spouse and Oldest Daughter and her husband Lives in: ONE level home Stairs: Ramped entry  Has following equipment at home: Environmental consultant - 2 wheeled, Environmental consultant - 4 wheeled, Wheelchair (manual), [employer], Grab bars, and U-Step walker, urinal at night   PLOF: Requires assistive device for independence, Needs assistance with ADLs, Needs assistance with gait, and Needs assistance with transfers  PATIENT GOALS: improve safety, reduce falls  OBJECTIVE:  Note: Objective measures were completed at Evaluation unless otherwise noted.  HAND DOMINANCE: Right  ADLs: Overall ADLs: overall mod assist Transfers/ambulation related to ADLs: uses walker in house, U-Step for community Eating: dep for cutting food, mod I feeding self Grooming: mod I for brushing teeth, wife shaves him  UB Dressing: max assist LB Dressing: max assist - pt pulls up pants once started, wife does shoes and socks d/t pt gets SOB bending over Toileting: assist for perineal care/hygiene and for clothes management Bathing: mod assist overall - Pt can wash UB, wife does from waist down/LB Tub Shower transfers: mod I w/ grab bars, walk in shower and shower seat Equipment: Grab bars and built in shower seat  IADLs: dependent (wife or daughter completing)  Community mobility: does not drive Medication management: wife takes care of Handwriting: 75% legible and in print (name only), less legibility signing   MOBILITY STATUS: regular walker to bathroom, U-Step in community. Wife reports using rollator in kitchen - therapist advised against this as regular rollator rolls too fast and pt  cannot stay close enough  POSTURE COMMENTS:  forward head  FUNCTIONAL OUTCOME MEASURES: Physical performance test: PPT#2 (simulated eating) 21.35 Pt requires max assist for jacket d/t deficits and decreased balance Pt does not hook/unhook buttons  COORDINATION: 9 Hole Peg test: Right: 91.41 sec (slower d/t index finger amputation, occasional assist from Lt hand); Left: 78.41 sec Box and Blocks:  Right 24 blocks, Left 19 blocks  UE ROM:  WFL elbows distally,  bilateral shoulder flexion approx 80% w/ stiffness bilateral shoulders   SENSATION: WFL   COGNITION: Overall  cognitive status: History of cognitive impairments - at baseline and cognition/memory decline over several years. 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 Pt required modifications and/or alternative options for foot ex - see pt instructions for details. Pt performed each x 10 reps w/ mod to max cues and multiple review  Pt issued PWR! Hands push - pt return demo x 5 reps  PATIENT EDUCATION: Education details: see above Person educated: Patient and Spouse Education method: Explanation, Demonstration, Verbal cues, and Handouts Education comprehension: verbalized understanding, returned demonstration, verbal cues required, tactile cues required, and needs further education  HOME EXERCISE PROGRAM: 10/09/23: bag ex's, PWR!  Hands push  GOALS: Goals reviewed with patient? Yes  SHORT TERM GOALS: Target date: 10/26/23  Pt independent with modified PD specific HEP (bag ex's, PWR! Hands push only, 3-4 coordination ex's)  Baseline: Goal status: IN PROGRESS  2.  Pt/family will verbalize understanding of adaptive strategies to increase ease/safety with ADLS/IADLS  (dressing, holding fork/spoon at center not end, writing)  Baseline:  Goal status: INITIAL  3.  Pt/family will verbalize and id 2-3 techniques to prevent falls  Baseline:  Goal status: INITIAL  4.  Pt/family to verbalize understanding with A/E to increase ease/safety with ADLS (LE dressing, opening containers, etc) Baseline:  Goal status: INITIAL   LONG TERM GOALS: Target date: 11/26/23  Pt will verbalize understanding of ways to further PD related complications and appropriate community resources prn  Baseline:  Goal status: INITIAL  2.  Improve BUE function as evidenced by increasing Box & Blocks score by 3 blocks Baseline: RUE = 24, LUE = 19 Goal status: INITIAL  3.  Pt will demonstrate improved ease with feeding as evidenced by decreasing PPT#2 by 3 secs  Baseline: 21.35 sec Goal status: INITIAL  4.  Pt to write name in print at 90% legibility Baseline: 75% legibility Goal status: INITIAL  ASSESSMENT:  CLINICAL IMPRESSION: Patient seen today for occupational therapy treatment for Parkinson's disease diagnosed in 1999. Pt presents today with bradykinesia, dysarthria, significantly impaired balance, decreased cognition, and slower/difficulty with assist needed for ADLS. Pt limited by deficits, however progressing towards STG #1.    PERFORMANCE DEFICITS: in functional skills including ADLs, coordination, dexterity, ROM, strength, Fine motor control, Gross motor control, hearing, mobility, body mechanics, endurance, decreased knowledge of precautions, decreased knowledge of use of DME, and UE functional use, cognitive skills including  attention, memory,  problem solving, and safety awareness, and psychosocial skills including coping strategies and environmental adaptation.   IMPAIRMENTS: are limiting patient from ADLs, IADLs, and social participation.   COMORBIDITIES:  may have co-morbidities  that affects occupational performance. Patient will benefit from skilled OT to address above impairments and improve overall function.  MODIFICATION OR ASSISTANCE TO COMPLETE EVALUATION: Min-Moderate modification of tasks or assist with assess necessary to complete an evaluation.  OT OCCUPATIONAL PROFILE AND HISTORY: Detailed assessment: Review of records and additional review of physical, cognitive, psychosocial history related to current functional performance.  CLINICAL DECISION MAKING: Moderate - several treatment options, min-mod task modification necessary  REHAB POTENTIAL: Fair severity of deficits, poor carryover/compliance at home  EVALUATION COMPLEXITY: Moderate    PLAN:  OT FREQUENCY: 2x/week  OT DURATION: 8 weeks (anticipate only 4-6 weeks)   PLANNED INTERVENTIONS: 97535 self care/ADL training, 02889 therapeutic exercise, 97530 therapeutic activity, 97112 neuromuscular re-education, 97140 manual therapy, 97010 moist heat, 97760 Orthotic Initial, 97763 Orthotic/Prosthetic subsequent, balance training, functional mobility training, visual/perceptual remediation/compensation, energy conservation, coping strategies training, patient/family education, and DME and/or AE instructions  RECOMMENDED OTHER SERVICES: none at this time  CONSULTED AND AGREED WITH PLAN OF CARE: Patient and family member/caregiver  PLAN FOR NEXT SESSION: Review bag ex's prn, issue simple coordination ex's (3-4 max), ADL strategies  Burnard JINNY Roads, OT 10/09/2023, 1:22 PM

## 2023-10-15 ENCOUNTER — Encounter: Payer: Self-pay | Admitting: Occupational Therapy

## 2023-10-15 ENCOUNTER — Ambulatory Visit: Attending: Physical Medicine & Rehabilitation | Admitting: Occupational Therapy

## 2023-10-15 DIAGNOSIS — R29898 Other symptoms and signs involving the musculoskeletal system: Secondary | ICD-10-CM | POA: Insufficient documentation

## 2023-10-15 DIAGNOSIS — R278 Other lack of coordination: Secondary | ICD-10-CM | POA: Diagnosis not present

## 2023-10-15 DIAGNOSIS — R29818 Other symptoms and signs involving the nervous system: Secondary | ICD-10-CM | POA: Insufficient documentation

## 2023-10-15 DIAGNOSIS — R41840 Attention and concentration deficit: Secondary | ICD-10-CM | POA: Diagnosis not present

## 2023-10-15 DIAGNOSIS — R2681 Unsteadiness on feet: Secondary | ICD-10-CM | POA: Insufficient documentation

## 2023-10-15 DIAGNOSIS — R293 Abnormal posture: Secondary | ICD-10-CM | POA: Diagnosis not present

## 2023-10-15 NOTE — Therapy (Signed)
 OUTPATIENT OCCUPATIONAL THERAPY [facility]  Patient Name: Henry [name] MRN: [id number] DOB:12/14/1950, 73 y.o., male Today's Date: 10/15/2023  PCP: Cleatus Arlyss RAMAN, MD REFERRING PROVIDER: Carilyn Prentice BRAVO, MD   END OF SESSION:  OT End of Session - 10/15/23 1238     Visit Number 3    Number of Visits 16    Date for Recertification  11/26/23    Authorization Type MCR    Progress Note Due on Visit 10    OT Start Time 1235    OT Stop Time 1315    OT Time Calculation (min) 40 min    Equipment Utilized During Treatment U-step    Activity Tolerance Patient tolerated treatment well    Behavior During Therapy WFL for tasks assessed/performed          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)   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   Dysrhythmia    a-fib   GERD (gastroesophageal reflux disease)    Glaucoma    right eye   History of kidney stones    multiple   HLD (hyperlipidemia)    HTN (hypertension)    pt denies 08/19/12   Hyperplasia, prostate    Hypothyroidism    MI (myocardial infarction) (HCC)    Dr Pietro 2000, x3vessels bypass   Neuromuscular disorder (HCC)    Parkinson's Disease   OSA (obstructive sleep apnea)    AHI-28,on CPAP, noncompliant with CPAP   Parkinson disease (HCC)    1999   PONV (postoperative nausea and vomiting)    Restless legs    Shortness of breath    Hx: of at all times   UTI (lower urinary tract infection) 09/15/2012   Klebsiella   Ventral hernia    Walker as ambulation aid    also uses wheelchair at home/when going out   Past Surgical History:   Procedure Laterality Date   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   BIOPSY  10/14/2018   Procedure: BIOPSY;  Surgeon: Albertus Gordy HERO, MD;  Location: [facility];  Service: Gastroenterology;;   CATARACT EXTRACTION W/ INTRAOCULAR LENS IMPLANT     Hx: of right eye   CATARACT EXTRACTION W/ INTRAOCULAR LENS IMPLANT Left 2018   COLONOSCOPY N/A 10/13/2014   Procedure: COLONOSCOPY;  Surgeon: Gordy HERO Albertus, MD;  Location: [facility];  Service: Gastroenterology;  Laterality: N/A;   COLONOSCOPY W/ BIOPSIES AND POLYPECTOMY     Hx: of   COLONOSCOPY WITH PROPOFOL  N/A 10/14/2018   Procedure: COLONOSCOPY WITH PROPOFOL ;  Surgeon: Albertus Gordy HERO, MD;  Location: [facility];  Service: Gastroenterology;  Laterality: N/A;   CORONARY ARTERY BYPASS GRAFT  2000   Sudie Laine, MD   CORONARY STENT PLACEMENT  1998   DEEP BRAIN STIMULATOR PLACEMENT  2004   Right and left VIN stimulator placement (parkinsons)   EYE SURGERY     Cataract removed in 1980s related to wood in eye when 73 years old   FINGER  AMPUTATION     left pointer   IMPLANTABLE CARDIOVERTER DEFIBRILLATOR IMPLANT N/A 11/13/2013   Procedure: IMPLANTABLE CARDIOVERTER DEFIBRILLATOR IMPLANT;  Surgeon: Danelle LELON Birmingham, MD;  Location: [facility] CATH LAB;  Service: Cardiovascular;  Laterality: N/A;   INSERT / REPLACE / REMOVE PACEMAKER     medtronic   LEFT AND RIGHT HEART CATHETERIZATION WITH CORONARY ANGIOGRAM N/A 09/24/2013   Procedure: LEFT AND RIGHT HEART CATHETERIZATION WITH CORONARY ANGIOGRAM;  Surgeon: Lonni JONETTA Cash, MD;  Location: [facility] CATH LAB;  Service: Cardiovascular;  Laterality: N/A;   LITHOTRIPSY     3 different times   POLYPECTOMY  10/14/2018   Procedure: POLYPECTOMY;  Surgeon: Albertus Gordy HERO, MD;  Location: [facility];  Service: Gastroenterology;;   PULSE GENERATOR IMPLANT Right 11/13/2017   Procedure: Right chest implantable pulse generator change;  Surgeon: Unice Pac, MD;  Location: [facility] OR;  Service: Neurosurgery;   Laterality: Right;  Right chest implantable pulse generator change   SUBTHALAMIC STIMULATOR BATTERY REPLACEMENT N/A 09/05/2012   Procedure: Deep brain stimulator battery change;  Surgeon: Pac Unice, MD;  Location: [facility];  Service: Neurosurgery;  Laterality: N/A;  Deep brain stimulator battery change   SUBTHALAMIC STIMULATOR BATTERY REPLACEMENT N/A 06/10/2015   Procedure: Deep Brain stimulator battery change;  Surgeon: Pac Unice, MD;  Location: [facility];  Service: Neurosurgery;  Laterality: N/A;   SUBTHALAMIC STIMULATOR BATTERY REPLACEMENT Right 09/25/2019   Procedure: Deep brain stimulator battery change;  Surgeon: Unice Pac, MD;  Location: [facility] OR;  Service: Neurosurgery;  Laterality: Right;   SUBTHALAMIC STIMULATOR BATTERY REPLACEMENT Right 12/15/2022   Procedure: IMPLANTABLE PULSE GENERATOR  BATTERY REPLACEMENT;  Surgeon: Carollee Lani BROCKS, DO;  Location: [facility] OR;  Service: Neurosurgery;  Laterality: Right;  3C   TONSILLECTOMY     Patient Active Problem List   Diagnosis Date Noted   Skin tear of hand without complication 08/03/2023   Delirium 05/29/2023   Healthcare maintenance 04/08/2023   Cerumen impaction 04/08/2023   Hordeolum 03/21/2023   Pancreatic cyst 03/05/2022   Lower respiratory infection 02/20/2022   Lobar pneumonia 02/20/2022   Sore throat 01/12/2022   Cyst of perianal area 01/12/2022   Strep pharyngitis 12/16/2021   Wheezing 12/16/2021   DOE (dyspnea on exertion) 12/16/2021   Parkinson's disease with EBS (electrical brain stimulation) [G20.A1] 12/05/2021   Fall at home 06/15/2021   Asthma    Skin lesion 05/30/2019   CAD (coronary artery disease) 08/25/2018   Diabetes mellitus without complication (HCC) 08/15/2018   Thrombocytopenia 07/08/2017   Gout, unspecified 06/06/2017   Dysuria 05/25/2017   Microscopic hematuria 05/25/2017   CKD (chronic kidney disease) stage 3, GFR 30-59 ml/min (HCC) 04/12/2017   Neuropathy 02/25/2017   Urinary frequency  09/19/2016   Constipation 11/27/2015   Medicare annual wellness visit, initial 09/23/2015   Advance care planning 09/23/2015   Spinal stenosis of lumbar region 01/26/2015   History of colonic polyps    Restless leg syndrome 08/11/2014   ICD- MDT, implanted 11/13/13 11/14/2013   Chronic systolic CHF (congestive heart failure) (HCC) 11/13/2013   SOB (shortness of breath), mostly on exertion 06/05/2013   Acute on chronic combined systolic and diastolic congestive heart failure, NYHA class 4 (HCC) 05/12/2013   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   Umbilical hernia 01/27/2013   Elevated PSA 12/04/2012   Other malaise and fatigue 12/04/2012   PD (Parkinson's disease) (HCC) 08/19/2012   OSA on CPAP 04/17/2012   Dysautonomia (HCC) 04/17/2012   Obesity, morbid (HCC) 04/17/2012  Hypothyroidism following radioiodine therapy 10/16/2011   GERD 05/11/2009   Type 2 diabetes mellitus with chronic kidney disease, with long-term current use of insulin  (HCC) 04/26/2009   Back pain 08/18/2008   OTHER SPEC FORMS CHRONIC ISCHEMIC HEART DISEASE 04/24/2008   HYPERPLASIA PROSTATE UNS W/O UR OBST & OTH LUTS 09/02/2007   NEPHROLITHIASIS, HX OF 09/02/2007   Ventral hernia 12/28/2006   Hyperlipidemia 08/31/2006   HTN (hypertension) 08/31/2006   Hx of CABG x 3 in 2000 08/31/2006    ONSET DATE: 09/03/2023 (referral date)   REFERRING DIAG: G20.B2 (ICD-10-CM) - Parkinson's disease with dyskinesia and fluctuating manifestations (HCC)   THERAPY DIAG:  Unsteadiness on feet  Abnormal posture  Other symptoms and signs involving the nervous system  Other lack of coordination  Other symptoms and signs involving the musculoskeletal system  Attention and concentration deficit  Rationale for Evaluation and Treatment: Rehabilitation  SUBJECTIVE:   SUBJECTIVE STATEMENT: Goes by [facility] I can't hear Pt had fall down to Rt knee last night Pt accompanied by: spouse Nadia)   PERTINENT HISTORY: AICD  present, CAD, deaf in R ear, DM II, GERD, MI, PD (1999), DBS (2003)    PRECAUTIONS: Fall, ICD/Pacemaker, and Other: DBS   WEIGHT BEARING RESTRICTIONS: No  PAIN:  Are you having pain? No  FALLS: Has patient fallen in last 6 months? Yes. Number of falls 7  LIVING ENVIRONMENT: Lives with: lives with their spouse and Oldest Daughter and her husband Lives in: ONE level home Stairs: Ramped entry  Has following equipment at home: Environmental consultant - 2 wheeled, Environmental consultant - 4 wheeled, Wheelchair (manual), [employer], Grab bars, and U-Step walker, urinal at night   PLOF: Requires assistive device for independence, Needs assistance with ADLs, Needs assistance with gait, and Needs assistance with transfers  PATIENT GOALS: improve safety, reduce falls  OBJECTIVE:  Note: Objective measures were completed at Evaluation unless otherwise noted.  HAND DOMINANCE: Right  ADLs: Overall ADLs: overall mod assist Transfers/ambulation related to ADLs: uses walker in house, U-Step for community Eating: dep for cutting food, mod I feeding self Grooming: mod I for brushing teeth, wife shaves him  UB Dressing: max assist LB Dressing: max assist - pt pulls up pants once started, wife does shoes and socks d/t pt gets SOB bending over Toileting: assist for perineal care/hygiene and for clothes management Bathing: mod assist overall - Pt can wash UB, wife does from waist down/LB Tub Shower transfers: mod I w/ grab bars, walk in shower and shower seat Equipment: Grab bars and built in shower seat  IADLs: dependent (wife or daughter completing)  Community mobility: does not drive Medication management: wife takes care of Handwriting: 75% legible and in print (name only), less legibility signing   MOBILITY STATUS: regular walker to bathroom, U-Step in community. Wife reports using rollator in kitchen - therapist advised against this as regular rollator rolls too fast and pt cannot stay close enough  POSTURE COMMENTS:   forward head  FUNCTIONAL OUTCOME MEASURES: Physical performance test: PPT#2 (simulated eating) 21.35 Pt requires max assist for jacket d/t deficits and decreased balance Pt does not hook/unhook buttons  COORDINATION: 9 Hole Peg test: Right: 91.41 sec (slower d/t index finger amputation, occasional assist from Lt hand); Left: 78.41 sec Box and Blocks:  Right 24 blocks, Left 19 blocks  UE ROM:  WFL elbows distally,  bilateral shoulder flexion approx 80% w/ stiffness bilateral shoulders   SENSATION: WFL   COGNITION: Overall cognitive status: History of cognitive impairments -  at baseline and cognition/memory decline over several years. 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 Pt also practiced alternative ex's for under feet including tapping alternating feet to chair, and stepping over something to side  Spent remainder of session working on safety with transfers from sit to/from stand and sitting at table. Pt/family educated on ways to reduce freezing including: marching to perform turns which improved freezing, and coming all the way around and standing up tall before sitting. Pt cued to push up from chair every time vs holding onto U-step to stand.   Pt with more difficulty and max cueing required to sit at table d/t extra steps involved and difficulty processing. Pt/family instructed in how to break task up into 2 parts prn for safety and to prevent  caregiver injury as well. Pt cued to march sideways into position at table, then come all the way to table (no space b/t body and table) and pull chair closer to table as he sits.   PATIENT EDUCATION: Education details: see above Person educated: Patient and Spouse Education method: Explanation, Demonstration, Verbal cues, and Handouts Education comprehension: verbalized understanding, returned demonstration, verbal cues required, tactile cues required, and needs further education  HOME EXERCISE PROGRAM: 10/09/23: bag ex's, PWR! Hands push  GOALS: Goals reviewed with patient? Yes  SHORT TERM GOALS: Target date: 10/26/23  Pt independent with modified PD specific HEP (bag ex's, PWR! Hands push only, 3-4 coordination ex's)  Baseline: Goal status: IN PROGRESS  2.  Pt/family will verbalize understanding of adaptive strategies to increase ease/safety with ADLS/IADLS  (dressing, holding fork/spoon at center not end, writing)  Baseline:  Goal status: INITIAL  3.  Pt/family will verbalize and id 2-3 techniques to prevent falls  Baseline:  Goal status: INITIAL  4.  Pt/family to verbalize understanding with A/E to increase ease/safety with ADLS (LE dressing, opening containers, etc) Baseline:  Goal status: INITIAL   LONG TERM GOALS: Target date: 11/26/23  Pt will verbalize understanding of ways to further PD related complications and appropriate community resources prn  Baseline:  Goal status: INITIAL  2.  Improve BUE function as evidenced by increasing Box & Blocks score by 3 blocks Baseline: RUE = 24, LUE = 19 Goal status: INITIAL  3.  Pt will demonstrate improved ease with feeding as evidenced by decreasing PPT#2 by 3 secs  Baseline: 21.35 sec Goal status: INITIAL  4.  Pt to write name  in print at 90% legibility Baseline: 75% legibility Goal status: INITIAL  ASSESSMENT:  CLINICAL IMPRESSION: Patient seen today for occupational therapy treatment for Parkinson's disease  diagnosed in 1999. Pt presents today with bradykinesia, dysarthria, significantly impaired balance, decreased cognition, and slower/difficulty with assist needed for ADLS. Pt limited by deficits, however progressing towards STG #1. Pt needs simple consistent reinforcement to perform sit to/from stand transfers correctly and prevent freezing with turns prior to sitting.    PERFORMANCE DEFICITS: in functional skills including ADLs, coordination, dexterity, ROM, strength, Fine motor control, Gross motor control, hearing, mobility, body mechanics, endurance, decreased knowledge of precautions, decreased knowledge of use of DME, and UE functional use, cognitive skills including attention, memory, problem solving, and safety awareness, and psychosocial skills including coping strategies and environmental adaptation.   IMPAIRMENTS: are limiting patient from ADLs, IADLs, and social participation.   COMORBIDITIES:  may have co-morbidities  that affects occupational performance. Patient will benefit from skilled OT to address above impairments and improve overall function.  MODIFICATION OR ASSISTANCE TO COMPLETE EVALUATION: Min-Moderate modification of tasks or assist with assess necessary to complete an evaluation.  OT OCCUPATIONAL PROFILE AND HISTORY: Detailed assessment: Review of records and additional review of physical, cognitive, psychosocial history related to current functional performance.  CLINICAL DECISION MAKING: Moderate - several treatment options, min-mod task modification necessary  REHAB POTENTIAL: Fair severity of deficits, poor carryover/compliance at home  EVALUATION COMPLEXITY: Moderate    PLAN:  OT FREQUENCY: 2x/week  OT DURATION: 8 weeks (anticipate only 4-6 weeks)   PLANNED INTERVENTIONS: 97535 self care/ADL training, 02889 therapeutic exercise, 97530 therapeutic activity, 97112 neuromuscular re-education, 97140 manual therapy, 97010 moist heat, 97760 Orthotic Initial, 97763  Orthotic/Prosthetic subsequent, balance training, functional mobility training, visual/perceptual remediation/compensation, energy conservation, coping strategies training, patient/family education, and DME and/or AE instructions  RECOMMENDED OTHER SERVICES: none at this time  CONSULTED AND AGREED WITH PLAN OF CARE: Patient and family member/caregiver  PLAN FOR NEXT SESSION:  issue simple coordination ex's (3-4 max), ADL strategies  Burnard JINNY Roads, OT 10/15/2023, 12:38 PM

## 2023-10-16 ENCOUNTER — Ambulatory Visit: Admitting: Occupational Therapy

## 2023-10-16 ENCOUNTER — Encounter: Payer: Self-pay | Admitting: Occupational Therapy

## 2023-10-16 DIAGNOSIS — R41840 Attention and concentration deficit: Secondary | ICD-10-CM | POA: Diagnosis not present

## 2023-10-16 DIAGNOSIS — R293 Abnormal posture: Secondary | ICD-10-CM | POA: Diagnosis not present

## 2023-10-16 DIAGNOSIS — R278 Other lack of coordination: Secondary | ICD-10-CM

## 2023-10-16 DIAGNOSIS — R29818 Other symptoms and signs involving the nervous system: Secondary | ICD-10-CM | POA: Diagnosis not present

## 2023-10-16 DIAGNOSIS — R29898 Other symptoms and signs involving the musculoskeletal system: Secondary | ICD-10-CM | POA: Diagnosis not present

## 2023-10-16 DIAGNOSIS — R2681 Unsteadiness on feet: Secondary | ICD-10-CM | POA: Diagnosis not present

## 2023-10-16 NOTE — Therapy (Signed)
 OUTPATIENT OCCUPATIONAL THERAPY [facility]  Patient Name: Henry [name] MRN: [id number] DOB:1950-01-26, 73 y.o., male Today's Date: 10/16/2023  PCP: Cleatus Arlyss RAMAN, MD REFERRING PROVIDER: Carilyn Prentice BRAVO, MD   END OF SESSION:  OT End of Session - 10/16/23 1456     Visit Number 4    Number of Visits 16    Date for Recertification  11/26/23    Authorization Type MCR    Progress Note Due on Visit 10    OT Start Time 1445    OT Stop Time 1530    OT Time Calculation (min) 45 min    Equipment Utilized During Treatment U-step    Activity Tolerance Patient tolerated treatment well    Behavior During Therapy WFL for tasks assessed/performed          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)   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   Dysrhythmia    a-fib   GERD (gastroesophageal reflux disease)    Glaucoma    right eye   History of kidney stones    multiple   HLD (hyperlipidemia)    HTN (hypertension)    pt denies 08/19/12   Hyperplasia, prostate    Hypothyroidism    MI (myocardial infarction) (HCC)    Dr Pietro 2000, x3vessels bypass   Neuromuscular disorder (HCC)    Parkinson's Disease   OSA (obstructive sleep apnea)    AHI-28,on CPAP, noncompliant with CPAP   Parkinson disease (HCC)    1999   PONV (postoperative nausea and vomiting)    Restless legs    Shortness of breath    Hx: of at all times   UTI (lower urinary tract infection) 09/15/2012   Klebsiella   Ventral hernia    Walker as ambulation aid    also uses wheelchair at home/when going out   Past Surgical History:   Procedure Laterality Date   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   BIOPSY  10/14/2018   Procedure: BIOPSY;  Surgeon: Albertus Gordy HERO, MD;  Location: [facility];  Service: Gastroenterology;;   CATARACT EXTRACTION W/ INTRAOCULAR LENS IMPLANT     Hx: of right eye   CATARACT EXTRACTION W/ INTRAOCULAR LENS IMPLANT Left 2018   COLONOSCOPY N/A 10/13/2014   Procedure: COLONOSCOPY;  Surgeon: Gordy HERO Albertus, MD;  Location: [facility];  Service: Gastroenterology;  Laterality: N/A;   COLONOSCOPY W/ BIOPSIES AND POLYPECTOMY     Hx: of   COLONOSCOPY WITH PROPOFOL  N/A 10/14/2018   Procedure: COLONOSCOPY WITH PROPOFOL ;  Surgeon: Albertus Gordy HERO, MD;  Location: [facility];  Service: Gastroenterology;  Laterality: N/A;   CORONARY ARTERY BYPASS GRAFT  2000   Sudie Laine, MD   CORONARY STENT PLACEMENT  1998   DEEP BRAIN STIMULATOR PLACEMENT  2004   Right and left VIN stimulator placement (parkinsons)   EYE SURGERY     Cataract removed in 1980s related to wood in eye when 73 years old   FINGER  AMPUTATION     left pointer   IMPLANTABLE CARDIOVERTER DEFIBRILLATOR IMPLANT N/A 11/13/2013   Procedure: IMPLANTABLE CARDIOVERTER DEFIBRILLATOR IMPLANT;  Surgeon: Danelle LELON Birmingham, MD;  Location: [facility] C[facility] LAB;  Service: Cardiovascular;  Laterality: N/A;   INSERT / REPLACE / REMOVE PACEMAKER     medtronic   LEFT AND RIGHT HEART CATHETERIZATION WITH CORONARY ANGIOGRAM N/A 09/24/2013   Procedure: LEFT AND RIGHT HEART CATHETERIZATION WITH CORONARY ANGIOGRAM;  Surgeon: Lonni JONETTA Cash, MD;  Location: [facility] CATH LAB;  Service: Cardiovascular;  Laterality: N/A;   LITHOTRIPSY     3 different times   POLYPECTOMY  10/14/2018   Procedure: POLYPECTOMY;  Surgeon: Albertus Gordy HERO, MD;  Location: [facility];  Service: Gastroenterology;;   PULSE GENERATOR IMPLANT Right 11/13/2017   Procedure: Right chest implantable pulse generator change;  Surgeon: Unice Pac, MD;  Location: [facility];  Service: Neurosurgery;   Laterality: Right;  Right chest implantable pulse generator change   SUBTHALAMIC STIMULATOR BATTERY REPLACEMENT N/A 09/05/2012   Procedure: Deep brain stimulator battery change;  Surgeon: Pac Unice, MD;  Location: [facility];  Service: Neurosurgery;  Laterality: N/A;  Deep brain stimulator battery change   SUBTHALAMIC STIMULATOR BATTERY REPLACEMENT N/A 06/10/2015   Procedure: Deep Brain stimulator battery change;  Surgeon: Pac Unice, MD;  Location: [facility];  Service: Neurosurgery;  Laterality: N/A;   SUBTHALAMIC STIMULATOR BATTERY REPLACEMENT Right 09/25/2019   Procedure: Deep brain stimulator battery change;  Surgeon: Unice Pac, MD;  Location: [place] [facility];  Service: Neurosurgery;  Laterality: Right;   SUBTHALAMIC STIMULATOR BATTERY REPLACEMENT Right 12/15/2022   Procedure: IMPLANTABLE PULSE GENERATOR  BATTERY REPLACEMENT;  Surgeon: Carollee Lani BROCKS, DO;  Location: [facility] OR;  Service: Neurosurgery;  Laterality: Right;  3C   TONSILLECTOMY     Patient Active Problem List   Diagnosis Date Noted   Skin tear of hand without complication 08/03/2023   Delirium 05/29/2023   Healthcare maintenance 04/08/2023   Cerumen impaction 04/08/2023   Hordeolum 03/21/2023   Pancreatic cyst 03/05/2022   Lower respiratory infection 02/20/2022   Lobar pneumonia 02/20/2022   Sore throat 01/12/2022   Cyst of perianal area 01/12/2022   Strep pharyngitis 12/16/2021   Wheezing 12/16/2021   DOE (dyspnea on exertion) 12/16/2021   Parkinson's disease with EBS (electrical brain stimulation) [G20.A1] 12/05/2021   Fall at home 06/15/2021   Asthma    Skin lesion 05/30/2019   CAD (coronary artery disease) 08/25/2018   Diabetes mellitus without complication (HCC) 08/15/2018   Thrombocytopenia 07/08/2017   Gout, unspecified 06/06/2017   Dysuria 05/25/2017   Microscopic hematuria 05/25/2017   CKD (chronic kidney disease) stage 3, GFR 30-59 ml/min (HCC) 04/12/2017   Neuropathy 02/25/2017   Urinary frequency  09/19/2016   Constipation 11/27/2015   Medicare annual wellness visit, initial 09/23/2015   Advance care planning 09/23/2015   Spinal stenosis of lumbar region 01/26/2015   History of colonic polyps    Restless leg syndrome 08/11/2014   ICD- MDT, implanted 11/13/13 11/14/2013   Chronic systolic CHF (congestive heart failure) (HCC) 11/13/2013   SOB (shortness of breath), mostly on exertion 06/05/2013   Acute on chronic combined systolic and diastolic congestive heart failure, NYHA class 4 (HCC) 05/12/2013   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   Umbilical hernia 01/27/2013   Elevated PSA 12/04/2012   Other malaise and fatigue 12/04/2012   PD (Parkinson's disease) (HCC) 08/19/2012   OSA on CPAP 04/17/2012   Dysautonomia (HCC) 04/17/2012   Obesity, morbid (HCC) 04/17/2012  Hypothyroidism following radioiodine therapy 10/16/2011   GERD 05/11/2009   Type 2 diabetes mellitus with chronic kidney disease, with long-term current use of insulin  (HCC) 04/26/2009   Back pain 08/18/2008   OTHER SPEC FORMS CHRONIC ISCHEMIC HEART DISEASE 04/24/2008   HYPERPLASIA PROSTATE UNS W/O UR OBST & OTH LUTS 09/02/2007   NEPHROLITHIASIS, HX OF 09/02/2007   Ventral hernia 12/28/2006   Hyperlipidemia 08/31/2006   HTN (hypertension) 08/31/2006   Hx of CABG x 3 in 2000 08/31/2006    ONSET DATE: 09/03/2023 (referral date)   REFERRING DIAG: G20.B2 (ICD-10-CM) - Parkinson's disease with dyskinesia and fluctuating manifestations (HCC)   THERAPY DIAG:  Unsteadiness on feet  Abnormal posture  Other symptoms and signs involving the nervous system  Other lack of coordination  Rationale for Evaluation and Treatment: Rehabilitation  SUBJECTIVE:   SUBJECTIVE STATEMENT: Goes by [facility] I can't hear Pt had another fall getting into shower last night Pt accompanied by: spouse Nadia)   PERTINENT HISTORY: AICD present, CAD, deaf in R ear, DM II, GERD, MI, PD (1999), DBS (2003)    PRECAUTIONS: Fall,  ICD/Pacemaker, and Other: DBS   WEIGHT BEARING RESTRICTIONS: No  PAIN:  Are you having pain? No  FALLS: Has patient fallen in last 6 months? Yes. Number of falls 7  LIVING ENVIRONMENT: Lives with: lives with their spouse and Oldest Daughter and her husband Lives in: ONE level home Stairs: Ramped entry  Has following equipment at home: Environmental consultant - 2 wheeled, Environmental consultant - 4 wheeled, Wheelchair (manual), [employer], Grab bars, and U-Step walker, urinal at night   PLOF: Requires assistive device for independence, Needs assistance with ADLs, Needs assistance with gait, and Needs assistance with transfers  PATIENT GOALS: improve safety, reduce falls  OBJECTIVE:  Note: Objective measures were completed at Evaluation unless otherwise noted.  HAND DOMINANCE: Right  ADLs: Overall ADLs: overall mod assist Transfers/ambulation related to ADLs: uses walker in house, U-Step for community Eating: dep for cutting food, mod I feeding self Grooming: mod I for brushing teeth, wife shaves him  UB Dressing: max assist LB Dressing: max assist - pt pulls up pants once started, wife does shoes and socks d/t pt gets SOB bending over Toileting: assist for perineal care/hygiene and for clothes management Bathing: mod assist overall - Pt can wash UB, wife does from waist down/LB Tub Shower transfers: mod I w/ grab bars, walk in shower and shower seat Equipment: Grab bars and built in shower seat  IADLs: dependent (wife or daughter completing)  Community mobility: does not drive Medication management: wife takes care of Handwriting: 75% legible and in print (name only), less legibility signing   MOBILITY STATUS: regular walker to bathroom, U-Step in community. Wife reports using rollator in kitchen - therapist advised against this as regular rollator rolls too fast and pt cannot stay close enough  POSTURE COMMENTS:  forward head  FUNCTIONAL OUTCOME MEASURES: Physical performance test: PPT#2 (simulated  eating) 21.35 Pt requires max assist for jacket d/t deficits and decreased balance Pt does not hook/unhook buttons  COORDINATION: 9 Hole Peg test: Right: 91.41 sec (slower d/t index finger amputation, occasional assist from Lt hand); Left: 78.41 sec Box and Blocks:  Right 24 blocks, Left 19 blocks  UE ROM:  WFL elbows distally,  bilateral shoulder flexion approx 80% w/ stiffness bilateral shoulders   SENSATION: WFL   COGNITION: Overall cognitive status: History of cognitive impairments - at baseline and cognition/memory decline over several years. 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 Asked wife to take pictures of the bathroom and shower set up and bring next session for problem solving and safest way to transfer in/out of shower  Pt issued simple coordination HEP for hands - see pt instructions for details. Pt able to do all of them. Pt also practiced writing name in print w/ regular pen and built up pen. Pt did best w/ regular pen at 90-95% legibility. Pt then practiced signature  Issued ADL strategies (modified for patient) and reviewed with pt/wife. Pt encouraged to participate in BADLS seated. Also encouraged to wipe table and fold towels seated.   PATIENT EDUCATION: Education details: see above Person educated: Patient and Spouse Education method: Explanation, Demonstration, Verbal cues, and Handouts Education comprehension: verbalized understanding, returned demonstration, verbal cues required, tactile cues required, and needs further education  HOME EXERCISE  PROGRAM: 10/09/23: bag ex's, PWR! Hands push 10/16/23: coordination HEP, ADL strategies  GOALS: Goals reviewed with patient? Yes  SHORT TERM GOALS: Target date: 10/26/23  Pt independent with modified PD specific HEP (bag ex's, PWR! Hands push only, 3-4 coordination ex's)  Baseline: Goal status: IN PROGRESS  2.  Pt/family will verbalize understanding of adaptive strategies to increase ease/safety with ADLS/IADLS  (dressing, holding fork/spoon at center not end, writing)  Baseline:  Goal status: IN PROGRESS  3.  Pt/family will verbalize and id 2-3 techniques to prevent falls  Baseline:  Goal status: IN PROGRESS  4.  Pt/family to verbalize understanding with A/E to increase ease/safety with ADLS (LE dressing, opening containers, etc) Baseline:  Goal status: INITIAL   LONG TERM GOALS: Target date: 11/26/23  Pt will verbalize understanding of ways to further PD related complications and appropriate community resources prn  Baseline:  Goal status: INITIAL  2.  Improve BUE function as evidenced by increasing Box & Blocks score by 3 blocks Baseline: RUE = 24, LUE = 19 Goal status: INITIAL  3.  Pt will demonstrate improved ease with feeding as evidenced by decreasing PPT#2 by 3 secs  Baseline: 21.35 sec Goal status: INITIAL  4.  Pt to write name in print at 90% legibility Baseline: 75% legibility Goal status: INITIAL  ASSESSMENT:  CLINICAL IMPRESSION: Patient seen today for occupational therapy treatment for Parkinson's disease diagnosed in 1999. Pt presents today with bradykinesia, dysarthria, significantly impaired balance, decreased cognition, and slower/difficulty with assist needed for ADLS. Pt limited by deficits, however progressing towards some STGs. Pt with recent fall last night    PERFORMANCE DEFICITS: in functional skills including ADLs, coordination, dexterity, ROM, strength, Fine motor control, Gross motor control, hearing,  mobility, body mechanics, endurance,  decreased knowledge of precautions, decreased knowledge of use of DME, and UE functional use, cognitive skills including attention, memory, problem solving, and safety awareness, and psychosocial skills including coping strategies and environmental adaptation.   IMPAIRMENTS: are limiting patient from ADLs, IADLs, and social participation.   COMORBIDITIES:  may have co-morbidities  that affects occupational performance. Patient will benefit from skilled OT to address above impairments and improve overall function.  MODIFICATION OR ASSISTANCE TO COMPLETE EVALUATION: Min-Moderate modification of tasks or assist with assess necessary to complete an evaluation.  OT OCCUPATIONAL PROFILE AND HISTORY: Detailed assessment: Review of records and additional review of physical, cognitive, psychosocial history related to current functional performance.  CLINICAL DECISION MAKING: Moderate - several treatment options, min-mod task modification necessary  REHAB POTENTIAL: Fair severity of deficits, poor carryover/compliance at home  EVALUATION COMPLEXITY: Moderate    PLAN:  OT FREQUENCY: 2x/week  OT DURATION: 8 weeks (anticipate only 4-6 weeks)   PLANNED INTERVENTIONS: 97535 self care/ADL training, 02889 therapeutic exercise, 97530 therapeutic activity, 97112 neuromuscular re-education, 97140 manual therapy, 97010 moist heat, 97760 Orthotic Initial, 97763 Orthotic/Prosthetic subsequent, balance training, functional mobility training, visual/perceptual remediation/compensation, energy conservation, coping strategies training, patient/family education, and DME and/or AE instructions  RECOMMENDED OTHER SERVICES: none at this time  CONSULTED AND AGREED WITH PLAN OF CARE: Patient and family member/caregiver  PLAN FOR NEXT SESSION:  family to bring in pictures of bathroom and shower to assess and make recommendations prn for safety/fall prevention, progress towards remaining STG's  Burnard JINNY Roads,  OT 10/16/2023, 2:56 PM

## 2023-10-16 NOTE — Patient Instructions (Signed)
 Coordination Exercises  Perform the following exercises for 10-15 minutes 1 times per day. Perform with both hand(s). Perform using big movements.  Flipping Cards: Place deck of cards on the table. Flip cards over by opening your hand big to grasp and then turn your palm up big.  Flip card between first 3 fingers.   Pick up coins and place in coin bank or container: Pick up with big, intentional movements. Do not drag coin to the edge.  Practice writing name (Rt hand only): Slow down, write big, and focus on forming each letter.   Performing Daily Activities with Big Movements  Pick at least 2 activities a day and perform with BIG, DELIBERATE movements/effort.  Try different activities each day. This can make the activity easier and turn daily activities into exercise to prevent problems in the future!  If you are standing during the activity, make sure to keep feet apart and stand with good/big/posture.  Examples: Dressing  Pull-over shirt:  good posture, bring shirt to head (don't bring head down), deliberately push arms into sleeves Underwear/Pants/Shoes/Socks:  Sit, lean forward, and push each foot in deliberately 1 at a time Open hands to pull down shirt/put on socks/pull up pants--get more material in your hand  Bathing - Wash/dry with long strokes  Brushing your teeth - Big, slow movements  Eating - Hold utensil in the middle (not the end), hold fork straight up and down to stab food  Picking up a cup/bottle - Open hand up big and get object all the way in palm  Opening jar/bottle - Move as much as you can with each turn, twist wrist  Putting on seatbelt - Feet apart, twist when reaching across body, look at where you are reaching  Wiping counter/table - Move in big, long strokes, open hand  Folding clothes - Exaggerate arm movements  Walking into a store/restaurant - Walk with big steps, good posture, do NOT sit until directly in front of chair. March for turns    Standing up from a chair/recliner/sofa - Scoot forward, lean forward, and stand with big effort

## 2023-10-17 ENCOUNTER — Ambulatory Visit: Payer: Medicare Other

## 2023-10-17 ENCOUNTER — Ambulatory Visit

## 2023-10-17 DIAGNOSIS — Z23 Encounter for immunization: Secondary | ICD-10-CM | POA: Diagnosis not present

## 2023-10-17 DIAGNOSIS — I255 Ischemic cardiomyopathy: Secondary | ICD-10-CM

## 2023-10-17 NOTE — Progress Notes (Signed)
 Per orders of Mallie Gaskins, DPN AGNP-C, injection of HD flu vaccine given by Bobbette Sprague in right deltoid. Patient tolerated injection well.

## 2023-10-18 LAB — CUP PACEART REMOTE DEVICE CHECK
Battery Remaining Longevity: 10 mo
Battery Voltage: 2.88 V
Brady Statistic AP VP Percent: 0 %
Brady Statistic AP VS Percent: 0.81 %
Brady Statistic AS VP Percent: 0.06 %
Brady Statistic AS VS Percent: 99.13 %
Brady Statistic RA Percent Paced: 0.81 %
Brady Statistic RV Percent Paced: 0.07 %
Date Time Interrogation Session: 20251008012305
HighPow Impedance: 64 Ohm
Implantable Lead Connection Status: 753985
Implantable Lead Connection Status: 753985
Implantable Lead Implant Date: 20151105
Implantable Lead Implant Date: 20151105
Implantable Lead Location: 753859
Implantable Lead Location: 753860
Implantable Lead Model: 5076
Implantable Pulse Generator Implant Date: 20151105
Lead Channel Impedance Value: 437 Ohm
Lead Channel Impedance Value: 437 Ohm
Lead Channel Impedance Value: 513 Ohm
Lead Channel Pacing Threshold Amplitude: 0.375 V
Lead Channel Pacing Threshold Amplitude: 1.25 V
Lead Channel Pacing Threshold Pulse Width: 0.4 ms
Lead Channel Pacing Threshold Pulse Width: 0.4 ms
Lead Channel Sensing Intrinsic Amplitude: 1.25 mV
Lead Channel Sensing Intrinsic Amplitude: 1.25 mV
Lead Channel Sensing Intrinsic Amplitude: 16.375 mV
Lead Channel Sensing Intrinsic Amplitude: 16.375 mV
Lead Channel Setting Pacing Amplitude: 2.5 V
Lead Channel Setting Pacing Amplitude: 2.5 V
Lead Channel Setting Pacing Pulse Width: 0.4 ms
Lead Channel Setting Sensing Sensitivity: 0.3 mV
Zone Setting Status: 755011

## 2023-10-19 DIAGNOSIS — N200 Calculus of kidney: Secondary | ICD-10-CM | POA: Diagnosis not present

## 2023-10-19 NOTE — Progress Notes (Signed)
 Remote ICD Transmission

## 2023-10-23 ENCOUNTER — Ambulatory Visit: Admitting: Occupational Therapy

## 2023-10-23 DIAGNOSIS — R29898 Other symptoms and signs involving the musculoskeletal system: Secondary | ICD-10-CM | POA: Diagnosis not present

## 2023-10-23 DIAGNOSIS — R29818 Other symptoms and signs involving the nervous system: Secondary | ICD-10-CM | POA: Diagnosis not present

## 2023-10-23 DIAGNOSIS — R278 Other lack of coordination: Secondary | ICD-10-CM | POA: Diagnosis not present

## 2023-10-23 DIAGNOSIS — R2681 Unsteadiness on feet: Secondary | ICD-10-CM

## 2023-10-23 DIAGNOSIS — R293 Abnormal posture: Secondary | ICD-10-CM | POA: Diagnosis not present

## 2023-10-23 DIAGNOSIS — R41840 Attention and concentration deficit: Secondary | ICD-10-CM | POA: Diagnosis not present

## 2023-10-23 NOTE — Therapy (Signed)
 OUTPATIENT OCCUPATIONAL THERAPY [facility]  Patient Name: Henry [name] MRN: [id number] DOB:[birth year], 73 y.o., male Today's Date: 10/23/2023  PCP: Cleatus Arlyss RAMAN, MD REFERRING PROVIDER: Carilyn Prentice BRAVO, MD   END OF SESSION:  OT End of Session - 10/23/23 1559     Visit Number 5    Number of Visits 16    Date for Recertification  11/26/23    Authorization Type MCR    Progress Note Due on Visit 10    OT Start Time 1450    OT Stop Time 1535    OT Time Calculation (min) 45 min    Equipment Utilized During Treatment U-step    Activity Tolerance Patient tolerated treatment well    Behavior During Therapy WFL for tasks assessed/performed          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)   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   Dysrhythmia    a-fib   GERD (gastroesophageal reflux disease)    Glaucoma    right eye   History of kidney stones    multiple   HLD (hyperlipidemia)    HTN (hypertension)    pt denies 08/19/12   Hyperplasia, prostate    Hypothyroidism    MI (myocardial infarction) (HCC)    Dr Pietro 2000, x3vessels bypass   Neuromuscular disorder (HCC)    Parkinson's Disease   OSA (obstructive sleep apnea)    AHI-28,on CPAP, noncompliant with CPAP   Parkinson disease (HCC)    1999   PONV (postoperative nausea and vomiting)    Restless legs    Shortness of breath    Hx: of at all times   UTI (lower urinary tract infection) 09/15/2012   Klebsiella   Ventral hernia    Walker as ambulation aid    also uses wheelchair at home/when going out   Past Surgical History:   Procedure Laterality Date   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   BIOPSY  10/14/2018   Procedure: BIOPSY;  Surgeon: Albertus Gordy HERO, MD;  Location: [facility];  Service: Gastroenterology;;   CATARACT EXTRACTION W/ INTRAOCULAR LENS IMPLANT     Hx: of right eye   CATARACT EXTRACTION W/ INTRAOCULAR LENS IMPLANT Left 2018   COLONOSCOPY N/A 10/13/2014   Procedure: COLONOSCOPY;  Surgeon: Gordy HERO Albertus, MD;  Location: [facility];  Service: Gastroenterology;  Laterality: N/A;   COLONOSCOPY W/ BIOPSIES AND POLYPECTOMY     Hx: of   COLONOSCOPY WITH PROPOFOL  N/A 10/14/2018   Procedure: COLONOSCOPY WITH PROPOFOL ;  Surgeon: Albertus Gordy HERO, MD;  Location: [facility];  Service: Gastroenterology;  Laterality: N/A;   CORONARY ARTERY BYPASS GRAFT  2000   Sudie Laine, MD   CORONARY STENT PLACEMENT  1998   DEEP BRAIN STIMULATOR PLACEMENT  2004   Right and left VIN stimulator placement (parkinsons)   EYE SURGERY     Cataract removed in 1980s related to wood in eye when 73 years old   FINGER  AMPUTATION     left pointer   IMPLANTABLE CARDIOVERTER DEFIBRILLATOR IMPLANT N/A 11/13/2013   Procedure: IMPLANTABLE CARDIOVERTER DEFIBRILLATOR IMPLANT;  Surgeon: Danelle LELON Birmingham, MD;  Location: [facility] CATH LAB;  Service: Cardiovascular;  Laterality: N/A;   INSERT / REPLACE / REMOVE PACEMAKER     medtronic   LEFT AND RIGHT HEART CATHETERIZATION WITH CORONARY ANGIOGRAM N/A 09/24/2013   Procedure: LEFT AND RIGHT HEART CATHETERIZATION WITH CORONARY ANGIOGRAM;  Surgeon: Lonni JONETTA Cash, MD;  Location: [facility] CATH LAB;  Service: Cardiovascular;  Laterality: N/A;   LITHOTRIPSY     3 different times   POLYPECTOMY  10/14/2018   Procedure: POLYPECTOMY;  Surgeon: Albertus Gordy HERO, MD;  Location: [facility];  Service: Gastroenterology;;   PULSE GENERATOR IMPLANT Right 11/13/2017   Procedure: Right chest implantable pulse generator change;  Surgeon: Unice Pac, MD;  Location: [facility] OR;  Service: Neurosurgery;   Laterality: Right;  Right chest implantable pulse generator change   SUBTHALAMIC STIMULATOR BATTERY REPLACEMENT N/A 09/05/2012   Procedure: Deep brain stimulator battery change;  Surgeon: Pac Unice, MD;  Location: [facility];  Service: Neurosurgery;  Laterality: N/A;  Deep brain stimulator battery change   SUBTHALAMIC STIMULATOR BATTERY REPLACEMENT N/A 06/10/2015   Procedure: Deep Brain stimulator battery change;  Surgeon: Pac Unice, MD;  Location: [facility];  Service: Neurosurgery;  Laterality: N/A;   SUBTHALAMIC STIMULATOR BATTERY REPLACEMENT Right 09/25/2019   Procedure: Deep brain stimulator battery change;  Surgeon: Unice Pac, MD;  Location[facility] OR;  Service: Neurosurgery;  Laterality: Right;   SUBTHALAMIC STIMULATOR BATTERY REPLACEMENT Right 12/15/2022   Procedure: IMPLANTABLE PULSE GENERATOR  BATTERY REPLACEMENT;  Surgeon: Carollee Lani BROCKS, DO;  Location: [facility] OR;  Service: Neurosurgery;  Laterality: Right;  3C   TONSILLECTOMY     Patient Active Problem List   Diagnosis Date Noted   Skin tear of hand without complication 08/03/2023   Delirium 05/29/2023   Healthcare maintenance 04/08/2023   Cerumen impaction 04/08/2023   Hordeolum 03/21/2023   Pancreatic cyst 03/05/2022   Lower respiratory infection 02/20/2022   Lobar pneumonia 02/20/2022   Sore throat 01/12/2022   Cyst of perianal area 01/12/2022   Strep pharyngitis 12/16/2021   Wheezing 12/16/2021   DOE (dyspnea on exertion) 12/16/2021   Parkinson's disease with EBS (electrical brain stimulation) [G20.A1] 12/05/2021   Fall at home 06/15/2021   Asthma    Skin lesion 05/30/2019   CAD (coronary artery disease) 08/25/2018   Diabetes mellitus without complication (HCC) 08/15/2018   Thrombocytopenia 07/08/2017   Gout, unspecified 06/06/2017   Dysuria 05/25/2017   Microscopic hematuria 05/25/2017   CKD (chronic kidney disease) stage 3, GFR 30-59 ml/min (HCC) 04/12/2017   Neuropathy 02/25/2017   Urinary frequency  09/19/2016   Constipation 11/27/2015   Medicare annual wellness visit, initial 09/23/2015   Advance care planning 09/23/2015   Spinal stenosis of lumbar region 01/26/2015   History of colonic polyps    Restless leg syndrome 08/11/2014   ICD- MDT, implanted 11/13/13 11/14/2013   Chronic systolic CHF (congestive heart failure) (HCC) 11/13/2013   SOB (shortness of breath), mostly on exertion 06/05/2013   Acute on chronic combined systolic and diastolic congestive heart failure, NYHA class 4 (HCC) 05/12/2013   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   Umbilical hernia 01/27/2013   Elevated PSA 12/04/2012   Other malaise and fatigue 12/04/2012   PD (Parkinson's disease) (HCC) 08/19/2012   OSA on CPAP 04/17/2012   Dysautonomia (HCC) 04/17/2012   Obesity, morbid (HCC) 04/17/2012  Hypothyroidism following radioiodine therapy 10/16/2011   GERD 05/11/2009   Type 2 diabetes mellitus with chronic kidney disease, with long-term current use of insulin  (HCC) 04/26/2009   Back pain 08/18/2008   OTHER SPEC FORMS CHRONIC ISCHEMIC HEART DISEASE 04/24/2008   HYPERPLASIA PROSTATE UNS W/O UR OBST & OTH LUTS 09/02/2007   NEPHROLITHIASIS, HX OF 09/02/2007   Ventral hernia 12/28/2006   Hyperlipidemia 08/31/2006   HTN (hypertension) 08/31/2006   Hx of CABG x 3 in 2000 08/31/2006    ONSET DATE: 09/03/2023 (referral date)   REFERRING DIAG: G20.B2 (ICD-10-CM) - Parkinson's disease with dyskinesia and fluctuating manifestations (HCC)   THERAPY DIAG:  Unsteadiness on feet  Other symptoms and signs involving the nervous system  Attention and concentration deficit  Rationale for Evaluation and Treatment: Rehabilitation  SUBJECTIVE:   SUBJECTIVE STATEMENT: Goes by Marcey I can't hear Pt accompanied by: spouse Nadia)   PERTINENT HISTORY: AICD present, CAD, deaf in R ear, DM II, GERD, MI, PD (1999), DBS (2003)    PRECAUTIONS: Fall, ICD/Pacemaker, and Other: DBS   WEIGHT BEARING RESTRICTIONS:  No  PAIN:  Are you having pain? No  FALLS: Has patient fallen in last 6 months? Yes. Number of falls 7  LIVING ENVIRONMENT: Lives with: lives with their spouse and Oldest Daughter and her husband Lives in: ONE level home Stairs: Ramped entry  Has following equipment at home: Environmental consultant - 2 wheeled, Environmental consultant - 4 wheeled, Wheelchair (manual), [employer], Grab bars, and U-Step walker, urinal at night   PLOF: Requires assistive device for independence, Needs assistance with ADLs, Needs assistance with gait, and Needs assistance with transfers  PATIENT GOALS: improve safety, reduce falls  OBJECTIVE:  Note: Objective measures were completed at Evaluation unless otherwise noted.  HAND DOMINANCE: Right  ADLs: Overall ADLs: overall mod assist Transfers/ambulation related to ADLs: uses walker in house, U-Step for community Eating: dep for cutting food, mod I feeding self Grooming: mod I for brushing teeth, wife shaves him  UB Dressing: max assist LB Dressing: max assist - pt pulls up pants once started, wife does shoes and socks d/t pt gets SOB bending over Toileting: assist for perineal care/hygiene and for clothes management Bathing: mod assist overall - Pt can wash UB, wife does from waist down/LB Tub Shower transfers: mod I w/ grab bars, walk in shower and shower seat Equipment: Grab bars and built in shower seat  IADLs: dependent (wife or daughter completing)  Community mobility: does not drive Medication management: wife takes care of Handwriting: 75% legible and in print (name only), less legibility signing   MOBILITY STATUS: regular walker to bathroom, U-Step in community. Wife reports using rollator in kitchen - therapist advised against this as regular rollator rolls too fast and pt cannot stay close enough  POSTURE COMMENTS:  forward head  FUNCTIONAL OUTCOME MEASURES: Physical performance test: PPT#2 (simulated eating) 21.35 Pt requires max assist for jacket d/t deficits  and decreased balance Pt does not hook/unhook buttons  COORDINATION: 9 Hole Peg test: Right: 91.41 sec (slower d/t index finger amputation, occasional assist from Lt hand); Left: 78.41 sec Box and Blocks:  Right 24 blocks, Left 19 blocks  UE ROM:  WFL elbows distally,  bilateral shoulder flexion approx 80% w/ stiffness bilateral shoulders   SENSATION: WFL   COGNITION: Overall cognitive status: History of cognitive impairments - at baseline and cognition/memory decline over several years. 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 Front stepping in to prevent turning once in shower), however therapist still was concerned of safety d/t pt's decreased mobility w/ transfers and safety/body awareness and multiple falls.  Ultimately recommended either a roll in shower chair, or tub bench with sliding seat, or sponge bathing in chair/bed to prevent shower transfers and associated falls.   PATIENT EDUCATION: Education details: see above Person educated: Patient and Spouse Education method: Explanation, Demonstration, Verbal cues, and Handouts Education comprehension: verbalized understanding, returned demonstration, verbal cues required, tactile cues required, and needs further education  HOME EXERCISE PROGRAM: 10/09/23: bag ex's, PWR! Hands push 10/16/23: coordination HEP, ADL strategies  GOALS: Goals reviewed with patient? Yes  SHORT TERM GOALS: Target date:  10/26/23  Pt independent with modified PD specific HEP (bag ex's, PWR! Hands push only, 3-4 coordination ex's)  Baseline: Goal status: IN PROGRESS  2.  Pt/family will verbalize understanding of adaptive strategies to increase ease/safety with ADLS/IADLS  (dressing, holding fork/spoon at center not end, writing)  Baseline:  Goal status: IN PROGRESS  3.  Pt/family will verbalize and id 2-3 techniques to prevent falls  Baseline:  Goal status: IN PROGRESS  4.  Pt/family to verbalize understanding with A/E to increase ease/safety with ADLS (LE dressing, opening containers, etc) Baseline:  Goal status: INITIAL   LONG TERM GOALS: Target date: 11/26/23  Pt will verbalize understanding of ways to further PD related complications and appropriate community resources prn  Baseline:  Goal status: INITIAL  2.  Improve BUE function as evidenced by increasing Box & Blocks score by 3 blocks Baseline: RUE = 24, LUE = 19 Goal status: INITIAL  3.  Pt will demonstrate improved ease with feeding as evidenced by decreasing PPT#2 by 3 secs  Baseline: 21.35 sec Goal status: INITIAL  4.  Pt to write name in print at 90% legibility Baseline: 75% legibility Goal status: INITIAL  ASSESSMENT:  CLINICAL IMPRESSION: Patient seen today for occupational therapy treatment for Parkinson's disease diagnosed in 1999. Pt presents today with bradykinesia, dysarthria, significantly impaired balance, decreased cognition, and slower/difficulty with assist needed for ADLS. Pt limited by deficits, however progressing towards some STGs. Pt with multiple falls and high fall risk    PERFORMANCE DEFICITS: in functional skills including ADLs, coordination, dexterity, ROM, strength, Fine motor control, Gross motor control, hearing, mobility, body mechanics, endurance, decreased knowledge of precautions, decreased knowledge of use of DME, and UE functional use, cognitive skills including attention, memory, problem solving,  and safety awareness, and psychosocial skills including coping strategies and environmental adaptation.  IMPAIRMENTS: are limiting patient from ADLs, IADLs, and social participation.   COMORBIDITIES:  may have co-morbidities  that affects occupational performance. Patient will benefit from skilled OT to address above impairments and improve overall function.  MODIFICATION OR ASSISTANCE TO COMPLETE EVALUATION: Min-Moderate modification of tasks or assist with assess necessary to complete an evaluation.  OT OCCUPATIONAL PROFILE AND HISTORY: Detailed assessment: Review of records and additional review of physical, cognitive, psychosocial history related to current functional performance.  CLINICAL DECISION MAKING: Moderate - several treatment options, min-mod task modification necessary  REHAB POTENTIAL: Fair severity of deficits, poor carryover/compliance at home  EVALUATION COMPLEXITY: Moderate    PLAN:  OT FREQUENCY: 2x/week  OT DURATION: 8 weeks (anticipate only 4-6 weeks)   PLANNED INTERVENTIONS: 97535 self care/ADL training, 02889 therapeutic exercise, 97530 therapeutic activity, 97112 neuromuscular re-education, 97140 manual therapy, 97010 moist heat, 97760 Orthotic Initial, 97763 Orthotic/Prosthetic subsequent, balance training, functional mobility training, visual/perceptual remediation/compensation, energy conservation, coping strategies training, patient/family education, and DME and/or AE instructions  RECOMMENDED OTHER SERVICES: none at this time  CONSULTED AND AGREED WITH PLAN OF CARE: Patient and family member/caregiver  PLAN FOR NEXT SESSION:   progress towards remaining STG's  Burnard JINNY Roads, OT 10/23/2023, 4:00 PM

## 2023-10-24 ENCOUNTER — Ambulatory Visit: Payer: Self-pay | Admitting: Internal Medicine

## 2023-10-24 ENCOUNTER — Ambulatory Visit: Admitting: Occupational Therapy

## 2023-10-24 ENCOUNTER — Encounter: Payer: Self-pay | Admitting: Occupational Therapy

## 2023-10-24 DIAGNOSIS — R2681 Unsteadiness on feet: Secondary | ICD-10-CM

## 2023-10-24 DIAGNOSIS — R29898 Other symptoms and signs involving the musculoskeletal system: Secondary | ICD-10-CM

## 2023-10-24 DIAGNOSIS — R293 Abnormal posture: Secondary | ICD-10-CM

## 2023-10-24 DIAGNOSIS — R41840 Attention and concentration deficit: Secondary | ICD-10-CM | POA: Diagnosis not present

## 2023-10-24 DIAGNOSIS — R278 Other lack of coordination: Secondary | ICD-10-CM | POA: Diagnosis not present

## 2023-10-24 DIAGNOSIS — R29818 Other symptoms and signs involving the nervous system: Secondary | ICD-10-CM | POA: Diagnosis not present

## 2023-10-24 NOTE — Therapy (Signed)
 OUTPATIENT OCCUPATIONAL THERAPY [facility]  Patient Name: Henry [name] MRN: [id number] DOB:Mar 17, 1950, 73 y.o., male Today's Date: 10/24/2023  PCP: Cleatus Arlyss RAMAN, MD REFERRING PROVIDER: Carilyn Prentice BRAVO, MD   END OF SESSION:  OT End of Session - 10/24/23 1239     Visit Number 6    Number of Visits 16    Date for Recertification  11/26/23    Authorization Type MCR    Progress Note Due on Visit 10    OT Start Time 1235    OT Stop Time 1315    OT Time Calculation (min) 40 min    Equipment Utilized During Treatment U-step    Activity Tolerance Patient tolerated treatment well    Behavior During Therapy WFL for tasks assessed/performed          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)   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   Dysrhythmia    a-fib   GERD (gastroesophageal reflux disease)    Glaucoma    right eye   History of kidney stones    multiple   HLD (hyperlipidemia)    HTN (hypertension)    pt denies 08/19/12   Hyperplasia, prostate    Hypothyroidism    MI (myocardial infarction) (HCC)    Dr Pietro 2000, x3vessels bypass   Neuromuscular disorder (HCC)    Parkinson's Disease   OSA (obstructive sleep apnea)    AHI-28,on CPAP, noncompliant with CPAP   Parkinson disease (HCC)    1999   PONV (postoperative nausea and vomiting)    Restless legs    Shortness of breath    Hx: of at all times   UTI (lower urinary tract infection) 09/15/2012   Klebsiella   Ventral hernia    Walker as ambulation aid    also uses wheelchair at home/when going out   Past Surgical History:   Procedure Laterality Date   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   BIOPSY  10/14/2018   Procedure: BIOPSY;  Surgeon: Albertus Gordy HERO, MD;  Location: [facility];  Service: Gastroenterology;;   CATARACT EXTRACTION W/ INTRAOCULAR LENS IMPLANT     Hx: of right eye   CATARACT EXTRACTION W/ INTRAOCULAR LENS IMPLANT Left 2018   COLONOSCOPY N/A 10/13/2014   Procedure: COLONOSCOPY;  Surgeon: Gordy HERO Albertus, MD;  Location: [facility];  Service: Gastroenterology;  Laterality: N/A;   COLONOSCOPY W/ BIOPSIES AND POLYPECTOMY     Hx: of   COLONOSCOPY WITH PROPOFOL  N/A 10/14/2018   Procedure: COLONOSCOPY WITH PROPOFOL ;  Surgeon: Albertus Gordy HERO, MD;  Location: [facility];  Service: Gastroenterology;  Laterality: N/A;   CORONARY ARTERY BYPASS GRAFT  2000   Sudie Laine, MD   CORONARY STENT PLACEMENT  1998   DEEP BRAIN STIMULATOR PLACEMENT  2004   Right and left VIN stimulator placement (parkinsons)   EYE SURGERY     Cataract removed in 1980s related to wood in eye when 73 years old   FINGER  AMPUTATION     left pointer   IMPLANTABLE CARDIOVERTER DEFIBRILLATOR IMPLANT N/A 11/13/2013   Procedure: IMPLANTABLE CARDIOVERTER DEFIBRILLATOR IMPLANT;  Surgeon: Danelle LELON Birmingham, MD;  Location: [facility] CATH LAB;  Service: Cardiovascular;  Laterality: N/A;   INSERT / REPLACE / REMOVE PACEMAKER     medtronic   LEFT AND RIGHT HEART CATHETERIZATION WITH CORONARY ANGIOGRAM N/A 09/24/2013   Procedure: LEFT AND RIGHT HEART CATHETERIZATION WITH CORONARY ANGIOGRAM;  Surgeon: Lonni JONETTA Cash, MD;  Location: [facility] CATH LAB;  Service: Cardiovascular;  Laterality: N/A;   LITHOTRIPSY     3 different times   POLYPECTOMY  10/14/2018   Procedure: POLYPECTOMY;  Surgeon: Albertus Gordy HERO, MD;  Location: [facility];  Service: Gastroenterology;;   PULSE GENERATOR IMPLANT Right 11/13/2017   Procedure: Right chest implantable pulse generator change;  Surgeon: Unice Pac, MD;  Location: [facility] OR;  Service: Neurosurgery;   Laterality: Right;  Right chest implantable pulse generator change   SUBTHALAMIC STIMULATOR BATTERY REPLACEMENT N/A 09/05/2012   Procedure: Deep brain stimulator battery change;  Surgeon: Pac Unice, MD;  Location: [facility];  Service: Neurosurgery;  Laterality: N/A;  Deep brain stimulator battery change   SUBTHALAMIC STIMULATOR BATTERY REPLACEMENT N/A 06/10/2015   Procedure: Deep Brain stimulator battery change;  Surgeon: Pac Unice, MD;  Location: [facility];  Service: Neurosurgery;  Laterality: N/A;   SUBTHALAMIC STIMULATOR BATTERY REPLACEMENT Right 09/25/2019   Procedure: Deep brain stimulator battery change;  Surgeon: Unice Pac, MD;  Location: [facility] OR;  Service: Neurosurgery;  Laterality: Right;   SUBTHALAMIC STIMULATOR BATTERY REPLACEMENT Right 12/15/2022   Procedure: IMPLANTABLE PULSE GENERATOR  BATTERY REPLACEMENT;  Surgeon: Carollee Lani BROCKS, DO;  Location: [facility] OR;  Service: Neurosurgery;  Laterality: Right;  3C   TONSILLECTOMY     Patient Active Problem List   Diagnosis Date Noted   Skin tear of hand without complication 08/03/2023   Delirium 05/29/2023   Healthcare maintenance 04/08/2023   Cerumen impaction 04/08/2023   Hordeolum 03/21/2023   Pancreatic cyst 03/05/2022   Lower respiratory infection 02/20/2022   Lobar pneumonia 02/20/2022   Sore throat 01/12/2022   Cyst of perianal area 01/12/2022   Strep pharyngitis 12/16/2021   Wheezing 12/16/2021   DOE (dyspnea on exertion) 12/16/2021   Parkinson's disease with EBS (electrical brain stimulation) [G20.A1] 12/05/2021   Fall at home 06/15/2021   Asthma    Skin lesion 05/30/2019   CAD (coronary artery disease) 08/25/2018   Diabetes mellitus without complication (HCC) 08/15/2018   Thrombocytopenia 07/08/2017   Gout, unspecified 06/06/2017   Dysuria 05/25/2017   Microscopic hematuria 05/25/2017   CKD (chronic kidney disease) stage 3, GFR 30-59 ml/min (HCC) 04/12/2017   Neuropathy 02/25/2017   Urinary frequency  09/19/2016   Constipation 11/27/2015   Medicare annual wellness visit, initial 09/23/2015   Advance care planning 09/23/2015   Spinal stenosis of lumbar region 01/26/2015   History of colonic polyps    Restless leg syndrome 08/11/2014   ICD- MDT, implanted 11/13/13 11/14/2013   Chronic systolic CHF (congestive heart failure) (HCC) 11/13/2013   SOB (shortness of breath), mostly on exertion 06/05/2013   Acute on chronic combined systolic and diastolic congestive heart failure, NYHA class 4 (HCC) 05/12/2013   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   Umbilical hernia 01/27/2013   Elevated PSA 12/04/2012   Other malaise and fatigue 12/04/2012   PD (Parkinson's disease) (HCC) 08/19/2012   OSA on CPAP 04/17/2012   Dysautonomia (HCC) 04/17/2012   Obesity, morbid (HCC) 04/17/2012  Hypothyroidism following radioiodine therapy 10/16/2011   GERD 05/11/2009   Type 2 diabetes mellitus with chronic kidney disease, with long-term current use of insulin  (HCC) 04/26/2009   Back pain 08/18/2008   OTHER SPEC FORMS CHRONIC ISCHEMIC HEART DISEASE 04/24/2008   HYPERPLASIA PROSTATE UNS W/O UR OBST & OTH LUTS 09/02/2007   NEPHROLITHIASIS, HX OF 09/02/2007   Ventral hernia 12/28/2006   Hyperlipidemia 08/31/2006   HTN (hypertension) 08/31/2006   Hx of CABG x 3 in 2000 08/31/2006    ONSET DATE: 09/03/2023 (referral date)   REFERRING DIAG: G20.B2 (ICD-10-CM) - Parkinson's disease with dyskinesia and fluctuating manifestations (HCC)   THERAPY DIAG:  Unsteadiness on feet  Other symptoms and signs involving the nervous system  Abnormal posture  Other symptoms and signs involving the musculoskeletal system  Rationale for Evaluation and Treatment: Rehabilitation  SUBJECTIVE:   SUBJECTIVE STATEMENT: Goes by [facility] I can't hear No falls this past week. Wife reports he took a shower this morning and it went much better w/ suggestions given last session (side stepping in)  Pt accompanied by: spouse  Nadia)   PERTINENT HISTORY: AICD present, CAD, deaf in R ear, DM II, GERD, MI, PD (1999), DBS (2003)    PRECAUTIONS: Fall, ICD/Pacemaker, and Other: DBS   WEIGHT BEARING RESTRICTIONS: No  PAIN:  Are you having pain? No  FALLS: Has patient fallen in last 6 months? Yes. Number of falls 7  LIVING ENVIRONMENT: Lives with: lives with their spouse and Oldest Daughter and her husband Lives in: ONE level home Stairs: Ramped entry  Has following equipment at home: Environmental consultant - 2 wheeled, Environmental consultant - 4 wheeled, Wheelchair (manual), [employer], Grab bars, and U-Step walker, urinal at night   PLOF: Requires assistive device for independence, Needs assistance with ADLs, Needs assistance with gait, and Needs assistance with transfers  PATIENT GOALS: improve safety, reduce falls  OBJECTIVE:  Note: Objective measures were completed at Evaluation unless otherwise noted.  HAND DOMINANCE: Right  ADLs: Overall ADLs: overall mod assist Transfers/ambulation related to ADLs: uses walker in house, U-Step for community Eating: dep for cutting food, mod I feeding self Grooming: mod I for brushing teeth, wife shaves him  UB Dressing: max assist LB Dressing: max assist - pt pulls up pants once started, wife does shoes and socks d/t pt gets SOB bending over Toileting: assist for perineal care/hygiene and for clothes management Bathing: mod assist overall - Pt can wash UB, wife does from waist down/LB Tub Shower transfers: mod I w/ grab bars, walk in shower and shower seat Equipment: Grab bars and built in shower seat  IADLs: dependent (wife or daughter completing)  Community mobility: does not drive Medication management: wife takes care of Handwriting: 75% legible and in print (name only), less legibility signing   MOBILITY STATUS: regular walker to bathroom, U-Step in community. Wife reports using rollator in kitchen - therapist advised against this as regular rollator rolls too fast and pt cannot stay  close enough  POSTURE COMMENTS:  forward head  FUNCTIONAL OUTCOME MEASURES: Physical performance test: PPT#2 (simulated eating) 21.35 Pt requires max assist for jacket d/t deficits and decreased balance Pt does not hook/unhook buttons  COORDINATION: 9 Hole Peg test: Right: 91.41 sec (slower d/t index finger amputation, occasional assist from Lt hand); Left: 78.41 sec Box and Blocks:  Right 24 blocks, Left 19 blocks  UE ROM:  WFL elbows distally,  bilateral shoulder flexion approx 80% w/ stiffness bilateral shoulders   SENSATION: WFL   COGNITION:  Overall cognitive status: History of cognitive impairments - at baseline and cognition/memory decline over several years. 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 session. Therapist did caution however that d/t pt's fluctuating mobility, he may not always be safe using this method and suggested still exploring DME recommended, sponge bathing on days where he is not doing as well, and always making sure son-n-law is at home if showering in shower (to either help prevent fall or help pt get up if he has a fall)   Today, pt turned fully around w/ legs against back of chair before sitting and doing better overall with mobility and alertness than yesterday  Discussed medicine schedule in relationship to meals and instructed pt/wife for pt to avoid meals when taking PD meds as this may inhibit effectiveness of medicine. Recommended taking PD meds 30 minutes before a meal or at least 1  hour after a meal. Therapist helped pt/wife with schedule for the day on when to take medicine and when to eat. The improper use may have been contributing to more off times of meds in the later afternoon.   Pt/wife also issued ways to prevent future PD related complications and reviewed  strategies/tips that applied to him.   Also recommended holding eating utensil at middle of handle instead of end, donning Lt weaker side (shirt and pants) first and taking out last, turning to look at seatbelt prior to donning, and reviewed previously issued HEP.  Also reviewed fall prevention strategies including: proper use of DME, using U-step w/ ambulation, proper technique for transfers (stand to sit, shower transfers, etc) and removing clutter/keeping pathways clear and removing all throw rugs  Discussed staying as active as possible w/ SAFE activities (seated) and staying as social as possible  PATIENT EDUCATION: Education details: see above Person educated: Patient and Spouse Education method: Explanation, Demonstration, Verbal cues, and Handouts Education comprehension: verbalized understanding, returned demonstration, verbal cues required, tactile cues required, and needs further education  HOME EXERCISE PROGRAM: 10/09/23: bag ex's, PWR! Hands push 10/16/23: coordination HEP, ADL strategies 10/24/23: ways to prevent future PD related complications  GOALS: Goals reviewed with patient? Yes  SHORT TERM GOALS: Target date: 10/26/23  Pt independent with modified PD specific HEP (bag ex's, PWR! Hands push only, 3-4 coordination ex's)  Baseline: Goal status: MET  2.  Pt/family will verbalize understanding of adaptive strategies to increase ease/safety with ADLS/IADLS  (dressing, holding fork/spoon at center not end, writing)  Baseline:  Goal status: IN PROGRESS  3.  Pt/family will verbalize and  id 2-3 techniques to prevent falls  Baseline:  Goal status: MET  4.  Pt/family to verbalize  understanding with A/E to increase ease/safety with ADLS (LE dressing, opening containers, etc) Baseline:  Goal status: IN PROGRESS   LONG TERM GOALS: Target date: 11/26/23  Pt will verbalize understanding of ways to further PD related complications and appropriate community resources prn  Baseline:  Goal status: IN PROGRESS - gave info on first part 10/24/23  2.  Improve BUE function as evidenced by increasing Box & Blocks score by 3 blocks Baseline: RUE = 24, LUE = 19 Goal status: INITIAL  3.  Pt will demonstrate improved ease with feeding as evidenced by decreasing PPT#2 by 3 secs  Baseline: 21.35 sec Goal status: INITIAL  4.  Pt to write name in print at 90% legibility Baseline: 75% legibility Goal status: INITIAL  ASSESSMENT:  CLINICAL IMPRESSION: Patient seen today for occupational therapy treatment for Parkinson's disease diagnosed in 1999. Pt presents today with bradykinesia, dysarthria, significantly impaired balance, decreased cognition, and slower/difficulty with assist needed for ADLS. Pt limited by deficits, however progressing towards some STGs. Pt with multiple falls and high fall risk    PERFORMANCE DEFICITS: in functional skills including ADLs, coordination, dexterity, ROM, strength, Fine motor control, Gross motor control, hearing, mobility, body mechanics, endurance, decreased knowledge of precautions, decreased knowledge of use of DME, and UE functional use, cognitive skills including attention, memory, problem solving, and safety awareness, and psychosocial skills including coping strategies and environmental adaptation.   IMPAIRMENTS: are limiting patient from ADLs, IADLs, and social participation.   COMORBIDITIES:  may have co-morbidities  that affects occupational performance. Patient will benefit from skilled OT to address above impairments and improve overall function.  MODIFICATION OR ASSISTANCE TO COMPLETE EVALUATION: Min-Moderate modification of tasks  or assist with assess necessary to complete an evaluation.  OT OCCUPATIONAL PROFILE AND HISTORY: Detailed assessment: Review of records and additional review of physical, cognitive, psychosocial history related to current functional performance.  CLINICAL DECISION MAKING: Moderate - several treatment options, min-mod task modification necessary  REHAB POTENTIAL: Fair severity of deficits, poor carryover/compliance at home  EVALUATION COMPLEXITY: Moderate    PLAN:  OT FREQUENCY: 2x/week  OT DURATION: 8 weeks (anticipate only 4-6 weeks)   PLANNED INTERVENTIONS: 97535 self care/ADL training, 02889 therapeutic exercise, 97530 therapeutic activity, 97112 neuromuscular re-education, 97140 manual therapy, 97010 moist heat, 97760 Orthotic Initial, 97763 Orthotic/Prosthetic subsequent, balance training, functional mobility training, visual/perceptual remediation/compensation, energy conservation, coping strategies training, patient/family education, and DME and/or AE instructions  RECOMMENDED OTHER SERVICES: none at this time  CONSULTED AND AGREED WITH PLAN OF CARE: Patient and family member/caregiver  PLAN FOR NEXT SESSION:   progress towards remaining STG's, practice dressing, also issue POP info for LTG #1  Burnard JINNY Roads, OT 10/24/2023, 12:46 PM

## 2023-10-24 NOTE — Patient Instructions (Signed)
 Ways to prevent future Parkinson's related complications:  1.   Exercise regularly/daily. Challenge yourself SAFELY and try to get different types of exercise including: PWR! Moves, Cardio (cycling), strength and balance training, and stretches (Yoga)   2.   Focus on BIGGER movements during daily activities- really reach overhead, straighten elbows and extend fingers  3.   When dressing (especially jacket/coat) or reaching for your seatbelt make sure to use your body to assist by twisting while you reach and looking at where you are reaching - this can help to minimize stress on the shoulder and reduce the risk of a rotator cuff tear  4.   Swing your arms when you walk (unless using walker)! People with PD are at increased risk for frozen shoulder and swinging your arms can reduce this risk.  5. Drink plenty of water and eat a high fiber diet (fresh fruits and veggies, nuts/seeds, beans, some fish). Avoid canned foods, reduce sugar intake and dairy when possible  6. Do NOT take your Parkinson's medication around meals (take at least 30 min before a meal, or 1 hour after a meal), especially protein as it blocks the absorption of the medicine and will not work effectively  7. Keep your feet apart when you are standing (wider stance) to allow you to have better balance and to reach further with your arms. Also make sure your feet are apart before standing up.   8. Stay social! - Go out with friends, play card games with others, join a community group or community PD exercise class  9. Get a good night's sleep! Staying active during the day and developing a good bedtime routine can help with this.   10. Communicate with your neurologist any concerns and maintain overall health - keep blood pressure regulated, reduce stress/anxiety, good nutrition/gut health, etc.

## 2023-10-29 ENCOUNTER — Encounter: Admitting: Occupational Therapy

## 2023-10-29 DIAGNOSIS — I509 Heart failure, unspecified: Secondary | ICD-10-CM | POA: Diagnosis not present

## 2023-10-29 DIAGNOSIS — G20A1 Parkinson's disease without dyskinesia, without mention of fluctuations: Secondary | ICD-10-CM | POA: Diagnosis not present

## 2023-10-29 DIAGNOSIS — D631 Anemia in chronic kidney disease: Secondary | ICD-10-CM | POA: Diagnosis not present

## 2023-10-29 DIAGNOSIS — N2581 Secondary hyperparathyroidism of renal origin: Secondary | ICD-10-CM | POA: Diagnosis not present

## 2023-10-29 DIAGNOSIS — I129 Hypertensive chronic kidney disease with stage 1 through stage 4 chronic kidney disease, or unspecified chronic kidney disease: Secondary | ICD-10-CM | POA: Diagnosis not present

## 2023-10-29 DIAGNOSIS — N182 Chronic kidney disease, stage 2 (mild): Secondary | ICD-10-CM | POA: Diagnosis not present

## 2023-10-30 ENCOUNTER — Ambulatory Visit: Admitting: Occupational Therapy

## 2023-10-30 ENCOUNTER — Encounter: Payer: Self-pay | Admitting: Occupational Therapy

## 2023-10-30 DIAGNOSIS — R293 Abnormal posture: Secondary | ICD-10-CM

## 2023-10-30 DIAGNOSIS — R2681 Unsteadiness on feet: Secondary | ICD-10-CM | POA: Diagnosis not present

## 2023-10-30 DIAGNOSIS — R29898 Other symptoms and signs involving the musculoskeletal system: Secondary | ICD-10-CM | POA: Diagnosis not present

## 2023-10-30 DIAGNOSIS — R29818 Other symptoms and signs involving the nervous system: Secondary | ICD-10-CM

## 2023-10-30 DIAGNOSIS — R41840 Attention and concentration deficit: Secondary | ICD-10-CM | POA: Diagnosis not present

## 2023-10-30 DIAGNOSIS — R278 Other lack of coordination: Secondary | ICD-10-CM | POA: Diagnosis not present

## 2023-10-30 LAB — LAB REPORT - SCANNED: EGFR: 73

## 2023-10-30 NOTE — Therapy (Signed)
 OUTPATIENT OCCUPATIONAL THERAPY PARKINSON'S TREATMENT  Patient Name: Henry [name] MRN: [id number] DOB:1950/08/15, 73 y.o., male Today's Date: 10/30/2023  PCP: Cleatus Arlyss RAMAN, MD REFERRING PROVIDER: Carilyn Prentice BRAVO, MD   END OF SESSION:  OT End of Session - 10/30/23 1320     Visit Number 7    Number of Visits 16    Date for Recertification  11/26/23    Authorization Type MCR    Progress Note Due on Visit 10    OT Start Time 1317    OT Stop Time 1405    OT Time Calculation (min) 48 min    Equipment Utilized During Treatment U-step    Activity Tolerance Patient tolerated treatment well    Behavior During Therapy WFL for tasks assessed/performed          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)   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   Dysrhythmia    a-fib   GERD (gastroesophageal reflux disease)    Glaucoma    right eye   History of kidney stones    multiple   HLD (hyperlipidemia)    HTN (hypertension)    pt denies 08/19/12   Hyperplasia, prostate    Hypothyroidism    MI (myocardial infarction) (HCC)    Dr Pietro 2000, x3vessels bypass   Neuromuscular disorder (HCC)    Parkinson's Disease   OSA (obstructive sleep apnea)    AHI-28,on CPAP, noncompliant with CPAP   Parkinson disease (HCC)    1999   PONV (postoperative nausea and vomiting)    Restless legs    Shortness of breath    Hx: of at all times   UTI (lower urinary tract infection) 09/15/2012   Klebsiella   Ventral hernia    Walker as ambulation aid    also uses wheelchair at home/when going out   Past Surgical History:   Procedure Laterality Date   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   BIOPSY  10/14/2018   Procedure: BIOPSY;  Surgeon: Albertus Gordy HERO, MD;  Location: [facility];  Service: Gastroenterology;;   CATARACT EXTRACTION W/ INTRAOCULAR LENS IMPLANT     Hx: of right eye   CATARACT EXTRACTION W/ INTRAOCULAR LENS IMPLANT Left 2018   COLONOSCOPY N/A 10/13/2014   Procedure: COLONOSCOPY;  Surgeon: Gordy HERO Albertus, MD;  Location: [facility];  Service: Gastroenterology;  Laterality: N/A;   COLONOSCOPY W/ BIOPSIES AND POLYPECTOMY     Hx: of   COLONOSCOPY WITH PROPOFOL  N/A 10/14/2018   Procedure: COLONOSCOPY WITH PROPOFOL ;  Surgeon: Albertus Gordy HERO, MD;  Location: [facility];  Service: Gastroenterology;  Laterality: N/A;   CORONARY ARTERY BYPASS GRAFT  2000   Sudie Laine, MD   CORONARY STENT PLACEMENT  1998   DEEP BRAIN STIMULATOR PLACEMENT  2004   Right and left VIN stimulator placement (parkinsons)   EYE SURGERY     Cataract removed in 1980s related to wood in eye when 73 years old   FINGER  AMPUTATION     left pointer   IMPLANTABLE CARDIOVERTER DEFIBRILLATOR IMPLANT N/A 11/13/2013   Procedure: IMPLANTABLE CARDIOVERTER DEFIBRILLATOR IMPLANT;  Surgeon: Danelle LELON Birmingham, MD;  Location: [facility] C[facility] LAB;  Service: Cardiovascular;  Laterality: N/A;   INSERT / REPLACE / REMOVE PACEMAKER     medtronic   LEFT AND RIGHT HEART CATHETERIZATION WITH CORONARY ANGIOGRAM N/A 09/24/2013   Procedure: LEFT AND RIGHT HEART CATHETERIZATION WITH CORONARY ANGIOGRAM;  Surgeon: Lonni JONETTA Cash, MD;  Location: [facility] CATH LAB;  Service: Cardiovascular;  Laterality: N/A;   LITHOTRIPSY     3 different times   POLYPECTOMY  10/14/2018   Procedure: POLYPECTOMY;  Surgeon: Albertus Gordy HERO, MD;  Location: [facility];  Service: Gastroenterology;;   PULSE GENERATOR IMPLANT Right 11/13/2017   Procedure: Right chest implantable pulse generator change;  Surgeon: Unice Pac, MD;  Location: [facility] OR;  Service: Neurosurgery;   Laterality: Right;  Right chest implantable pulse generator change   SUBTHALAMIC STIMULATOR BATTERY REPLACEMENT N/A 09/05/2012   Procedure: Deep brain stimulator battery change;  Surgeon: Pac Unice, MD;  Location: [facility];  Service: Neurosurgery;  Laterality: N/A;  Deep brain stimulator battery change   SUBTHALAMIC STIMULATOR BATTERY REPLACEMENT N/A 06/10/2015   Procedure: Deep Brain stimulator battery change;  Surgeon: Pac Unice, MD;  Location: [facility];  Service: Neurosurgery;  Laterality: N/A;   SUBTHALAMIC STIMULATOR BATTERY REPLACEMENT Right 09/25/2019   Procedure: Deep brain stimulator battery change;  Surgeon: Unice Pac, MD;  Location: [facility] OR;  Service: Neurosurgery;  Laterality: Right;   SUBTHALAMIC STIMULATOR BATTERY REPLACEMENT Right 12/15/2022   Procedure: IMPLANTABLE PULSE GENERATOR  BATTERY REPLACEMENT;  Surgeon: Carollee Lani BROCKS, DO;  Location: [facility] OR;  Service: Neurosurgery;  Laterality: Right;  3C   TONSILLECTOMY     Patient Active Problem List   Diagnosis Date Noted   Skin tear of hand without complication 08/03/2023   Delirium 05/29/2023   Healthcare maintenance 04/08/2023   Cerumen impaction 04/08/2023   Hordeolum 03/21/2023   Pancreatic cyst 03/05/2022   Lower respiratory infection 02/20/2022   Lobar pneumonia 02/20/2022   Sore throat 01/12/2022   Cyst of perianal area 01/12/2022   Strep pharyngitis 12/16/2021   Wheezing 12/16/2021   DOE (dyspnea on exertion) 12/16/2021   Parkinson's disease with EBS (electrical brain stimulation) [G20.A1] 12/05/2021   Fall at home 06/15/2021   Asthma    Skin lesion 05/30/2019   CAD (coronary artery disease) 08/25/2018   Diabetes mellitus without complication (HCC) 08/15/2018   Thrombocytopenia 07/08/2017   Gout, unspecified 06/06/2017   Dysuria 05/25/2017   Microscopic hematuria 05/25/2017   CKD (chronic kidney disease) stage 3, GFR 30-59 ml/min (HCC) 04/12/2017   Neuropathy 02/25/2017   Urinary frequency  09/19/2016   Constipation 11/27/2015   Medicare annual wellness visit, initial 09/23/2015   Advance care planning 09/23/2015   Spinal stenosis of lumbar region 01/26/2015   History of colonic polyps    Restless leg syndrome 08/11/2014   ICD- MDT, implanted 11/13/13 11/14/2013   Chronic systolic CHF (congestive heart failure) (HCC) 11/13/2013   SOB (shortness of breath), mostly on exertion 06/05/2013   Acute on chronic combined systolic and diastolic congestive heart failure, NYHA class 4 (HCC) 05/12/2013   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   Umbilical hernia 01/27/2013   Elevated PSA 12/04/2012   Other malaise and fatigue 12/04/2012   PD (Parkinson's disease) (HCC) 08/19/2012   OSA on CPAP 04/17/2012   Dysautonomia (HCC) 04/17/2012   Obesity, morbid (HCC) 04/17/2012  Hypothyroidism following radioiodine therapy 10/16/2011   GERD 05/11/2009   Type 2 diabetes mellitus with chronic kidney disease, with long-term current use of insulin  (HCC) 04/26/2009   Back pain 08/18/2008   OTHER SPEC FORMS CHRONIC ISCHEMIC HEART DISEASE 04/24/2008   HYPERPLASIA PROSTATE UNS W/O UR OBST & OTH LUTS 09/02/2007   NEPHROLITHIASIS, HX OF 09/02/2007   Ventral hernia 12/28/2006   Hyperlipidemia 08/31/2006   HTN (hypertension) 08/31/2006   Hx of CABG x 3 in 2000 08/31/2006    ONSET DATE: 09/03/2023 (referral date)   REFERRING DIAG: G20.B2 (ICD-10-CM) - Parkinson's disease with dyskinesia and fluctuating manifestations (HCC)   THERAPY DIAG:  Unsteadiness on feet  Other symptoms and signs involving the nervous system  Abnormal posture  Attention and concentration deficit  Rationale for Evaluation and Treatment: Rehabilitation  SUBJECTIVE:   SUBJECTIVE STATEMENT: Goes by [facility] I can't hear No falls this past week and a half. Wife reports the new technique for shower transfers is still going well. Medicine schedule going well Pt accompanied by: spouse Nadia)   PERTINENT HISTORY: AICD  present, CAD, deaf in R ear, DM II, GERD, MI, PD (1999), DBS (2003)    PRECAUTIONS: Fall, ICD/Pacemaker, and Other: DBS   WEIGHT BEARING RESTRICTIONS: No  PAIN:  Are you having pain? No  FALLS: Has patient fallen in last 6 months? Yes. Number of falls 7  LIVING ENVIRONMENT: Lives with: lives with their spouse and Oldest Daughter and her husband Lives in: ONE level home Stairs: Ramped entry  Has following equipment at home: Environmental consultant - 2 wheeled, Environmental consultant - 4 wheeled, Wheelchair (manual), [employer], Grab bars, and U-Step walker, urinal at night   PLOF: Requires assistive device for independence, Needs assistance with ADLs, Needs assistance with gait, and Needs assistance with transfers  PATIENT GOALS: improve safety, reduce falls  OBJECTIVE:  Note: Objective measures were completed at Evaluation unless otherwise noted.  HAND DOMINANCE: Right  ADLs: Overall ADLs: overall mod assist Transfers/ambulation related to ADLs: uses walker in house, U-Step for community Eating: dep for cutting food, mod I feeding self Grooming: mod I for brushing teeth, wife shaves him  UB Dressing: max assist LB Dressing: max assist - pt pulls up pants once started, wife does shoes and socks d/t pt gets SOB bending over Toileting: assist for perineal care/hygiene and for clothes management Bathing: mod assist overall - Pt can wash UB, wife does from waist down/LB Tub Shower transfers: mod I w/ grab bars, walk in shower and shower seat Equipment: Grab bars and built in shower seat  IADLs: dependent (wife or daughter completing)  Community mobility: does not drive Medication management: wife takes care of Handwriting: 75% legible and in print (name only), less legibility signing   MOBILITY STATUS: regular walker to bathroom, U-Step in community. Wife reports using rollator in kitchen - therapist advised against this as regular rollator rolls too fast and pt cannot stay close enough  POSTURE COMMENTS:   forward head  FUNCTIONAL OUTCOME MEASURES: Physical performance test: PPT#2 (simulated eating) 21.35 Pt requires max assist for jacket d/t deficits and decreased balance Pt does not hook/unhook buttons  COORDINATION: 9 Hole Peg test: Right: 91.41 sec (slower d/t index finger amputation, occasional assist from Lt hand); Left: 78.41 sec Box and Blocks:  Right 24 blocks, Left 19 blocks  UE ROM:  WFL elbows distally,  bilateral shoulder flexion approx 80% w/ stiffness bilateral shoulders   SENSATION: WFL   COGNITION: Overall cognitive status: History of cognitive impairments -  at baseline and cognition/memory decline over several years. 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 Wife also reports doing well with medicine schedule.   Issued and reviewed handwriting strategies. Pt has been practicing writing first name. Pt/wife also given PD support group info  Simulated eating using beans - pt able to do well w/ no spills and cues to bring food up to mouth, NOT bring head down to food. Pt also cued to hold spoon closer to middle and not end. However, pt doing well with this otherwise. Suggested bringing bowl up close to mouth too when eating soup or cereal  Practiced UE dressing with cues to don Lt side first and fully pull down shirt in back and Lt side. Pt cued NOT to stand for this but to lean forward in chair.  Pt also practiced donning shorts over feet w/ min assist only to fully get Lt foot in, then pt was able to don Rt foot I'ly. Pt tried to  stand with socks on but therapist stopped him and cued him that he would need shoes on and assist to stand to don pants over hips. Pt/wife shown shoe funnel and adapted laces so pt could don/doff shoes himself. However, pt instructed to wait for someone to assist getting pants over hips in standing to prevent falls.   Also practiced donning jacket w/ min to mod assist standing with back against counter, and close supervision to doff jacket w/ back against counter. Pt/wife instructed that he would always need help with this as pt tends to go forward and let knees give if not cued.   Pt continues to need cues for safety, fall prevention, and transfers, however overall is waiting to sit until he is fully turned w/ legs at back of chair. Still requires cues for hand placement on chair not U-step.   PATIENT EDUCATION: Education details: see above Person educated: Patient and Spouse Education method: Explanation, Demonstration, Verbal cues, and Handouts Education comprehension: verbalized understanding, returned demonstration, verbal cues required, tactile cues required, and needs further education  HOME EXERCISE PROGRAM: 10/09/23: bag ex's, PWR! Hands push 10/16/23: coordination HEP, ADL strategies 10/24/23: ways to prevent future PD related complications 10/30/23: handwriting strategies, POP info   GOALS: Goals reviewed with patient? Yes  SHORT TERM GOALS: Target date: 10/26/23  Pt independent with modified PD specific HEP (bag ex's, PWR! Hands push only, 3-4 coordination ex's)  Baseline: Goal status: MET  2.  Pt/family will verbalize understanding of adaptive strategies to increase ease/safety with ADLS/IADLS  (dressing, holding fork/spoon at  center not end, writing)  Baseline:  Goal status: MET   3.  Pt/family will verbalize and id 2-3 techniques to prevent falls  Baseline:  Goal status: MET  4.  Pt/family to verbalize understanding with A/E to increase ease/safety with ADLS (LE  dressing, opening containers, etc) Baseline:  Goal status: IN PROGRESS   LONG TERM GOALS: Target date: 11/26/23  Pt will verbalize understanding of ways to further PD related complications and appropriate community resources prn  Baseline:  Goal status: MET  2.  Improve BUE function as evidenced by increasing Box & Blocks score by 3 blocks Baseline: RUE = 24, LUE = 19 Goal status: INITIAL  3.  Pt will demonstrate improved ease with feeding as evidenced by decreasing PPT#2 by 3 secs  Baseline: 21.35 sec Goal status: IN PROGRESS  4.  Pt to write name in print at 90% legibility Baseline: 75% legibility Goal status: IN PROGRESS    ASSESSMENT:  CLINICAL IMPRESSION: Patient seen today for occupational therapy treatment for Parkinson's disease diagnosed in 1999. Pt presents today with bradykinesia, dysarthria, significantly impaired balance, decreased cognition, and slower/difficulty with assist needed for ADLS. Pt limited by deficits, however progressing towards some STGs. Pt with multiple falls and high fall risk however decreased falls more recently and increased carryover w/ sit-stand safely.    PERFORMANCE DEFICITS: in functional skills including ADLs, coordination, dexterity, ROM, strength, Fine motor control, Gross motor control, hearing, mobility, body mechanics, endurance, decreased knowledge of precautions, decreased knowledge of use of DME, and UE functional use, cognitive skills including attention, memory, problem solving, and safety awareness, and psychosocial skills including coping strategies and environmental adaptation.   IMPAIRMENTS: are limiting patient from ADLs, IADLs, and social participation.   COMORBIDITIES:  may have co-morbidities  that affects occupational performance. Patient will benefit from skilled OT to address above impairments and improve overall function.  MODIFICATION OR ASSISTANCE TO COMPLETE EVALUATION: Min-Moderate modification of tasks or assist  with assess necessary to complete an evaluation.  OT OCCUPATIONAL PROFILE AND HISTORY: Detailed assessment: Review of records and additional review of physical, cognitive, psychosocial history related to current functional performance.  CLINICAL DECISION MAKING: Moderate - several treatment options, min-mod task modification necessary  REHAB POTENTIAL: Fair severity of deficits, poor carryover/compliance at home  EVALUATION COMPLEXITY: Moderate    PLAN:  OT FREQUENCY: 2x/week  OT DURATION: 8 weeks (anticipate only 4-6 weeks)   PLANNED INTERVENTIONS: 97535 self care/ADL training, 02889 therapeutic exercise, 97530 therapeutic activity, 97112 neuromuscular re-education, 97140 manual therapy, 97010 moist heat, 97760 Orthotic Initial, 97763 Orthotic/Prosthetic subsequent, balance training, functional mobility training, visual/perceptual remediation/compensation, energy conservation, coping strategies training, patient/family education, and DME and/or AE instructions  RECOMMENDED OTHER SERVICES: none at this time  CONSULTED AND AGREED WITH PLAN OF CARE: Patient and family member/caregiver  PLAN FOR NEXT SESSION:   progress towards remaining STG (opening containers) and LTG's - d/c next week  Burnard JINNY Roads, OT 10/30/2023, 2:35 PM

## 2023-10-30 NOTE — Patient Instructions (Signed)
 Suggestions for Handwriting Changes Many people with Parkinson's notice changes in their handwriting.  Handwriting often becomes small and cramped, and can become more difficult to control when writing for longer periods of time.  This handwriting change is called micrographia.  Why does micrographia occur?  Parkinson's can cause slowing of movement, and feelings of muscle stiffness in the hands and fingers.  Loss of automatic motion also affects the easy, flowing motion of handwriting.  This can impact even simple writing tasks such as signing your name or writing a shopping list.  Attempts to write quickly without thinking about forming each letter contributes to small, cramped handwriting, and may cause the hand to develop a feeling of tightness.  How can I make writing easier?  Make a deliberate effort to form each letter.  This can be hard to do at first, but is very effective in improving size and legibility of handwriting.  Use a pen grip (round or triangular shaped rubber or foam cylinders available at stationery stores or where writing materials are found) or a larger size pen to keep your hand more relaxed.  Try printing rather than writing in a cursive style. Printing causes you to pause briefly between each letter, keeping writing more legible.   Using lined paper may provide a "visual target" to keep all letters big when writing.   A ballpoint pen typically works better than felt tip or "rolling writer"/gel styles.  Rest your hand if it begins to feel "tight".  Pause briefly when you see your handwriting becoming smaller.  Avoid hurrying or trying to write long passages if you are feeling stressed or fatigued.  Practice helps!  Remind yourself to slow down, aim big, and pause often!  Perform "flicks"/PWR! Hands if your hand feels tight, your writing gets smaller, before you start writing, or if tremors increase.  Involving your team: An occupational therapist can provide  assessment and individual recommendations for improvement of your handwriting.  This handout was adapted from [email or web address] [employer]

## 2023-10-31 ENCOUNTER — Encounter: Payer: Self-pay | Admitting: Occupational Therapy

## 2023-10-31 ENCOUNTER — Ambulatory Visit: Admitting: Occupational Therapy

## 2023-10-31 DIAGNOSIS — R2681 Unsteadiness on feet: Secondary | ICD-10-CM | POA: Diagnosis not present

## 2023-10-31 DIAGNOSIS — R41840 Attention and concentration deficit: Secondary | ICD-10-CM | POA: Diagnosis not present

## 2023-10-31 DIAGNOSIS — R293 Abnormal posture: Secondary | ICD-10-CM | POA: Diagnosis not present

## 2023-10-31 DIAGNOSIS — R29898 Other symptoms and signs involving the musculoskeletal system: Secondary | ICD-10-CM

## 2023-10-31 DIAGNOSIS — R278 Other lack of coordination: Secondary | ICD-10-CM | POA: Diagnosis not present

## 2023-10-31 DIAGNOSIS — R29818 Other symptoms and signs involving the nervous system: Secondary | ICD-10-CM | POA: Diagnosis not present

## 2023-10-31 NOTE — Therapy (Signed)
 OUTPATIENT OCCUPATIONAL THERAPY [facility]  Patient Name: Henry [name] MRN: [id number] DOB:1950/10/06, 72 y.o., male Today's Date: 10/31/2023  PCP: Cleatus Arlyss RAMAN, MD REFERRING PROVIDER: Carilyn Prentice BRAVO, MD   END OF SESSION:  OT End of Session - 10/31/23 1413     Visit Number 8    Number of Visits 16    Date for Recertification  11/26/23    Authorization Type MCR    Progress Note Due on Visit 10    OT Start Time 1407    OT Stop Time 1445    OT Time Calculation (min) 38 min    Equipment Utilized During Treatment U-step    Activity Tolerance Patient tolerated treatment well    Behavior During Therapy WFL for tasks assessed/performed          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)   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   Dysrhythmia    a-fib   GERD (gastroesophageal reflux disease)    Glaucoma    right eye   History of kidney stones    multiple   HLD (hyperlipidemia)    HTN (hypertension)    pt denies 08/19/12   Hyperplasia, prostate    Hypothyroidism    MI (myocardial infarction) (HCC)    Dr Pietro 2000, x3vessels bypass   Neuromuscular disorder (HCC)    Parkinson's Disease   OSA (obstructive sleep apnea)    AHI-28,on CPAP, noncompliant with CPAP   Parkinson disease (HCC)    1999   PONV (postoperative nausea and vomiting)    Restless legs    Shortness of breath    Hx: of at all times   UTI (lower urinary tract infection) 09/15/2012   Klebsiella   Ventral hernia    Walker as ambulation aid    also uses wheelchair at home/when going out   Past Surgical History:   Procedure Laterality Date   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   BIOPSY  10/14/2018   Procedure: BIOPSY;  Surgeon: Albertus Gordy HERO, MD;  Location: [facility];  Service: Gastroenterology;;   CATARACT EXTRACTION W/ INTRAOCULAR LENS IMPLANT     Hx: of right eye   CATARACT EXTRACTION W/ INTRAOCULAR LENS IMPLANT Left 2018   COLONOSCOPY N/A 10/13/2014   Procedure: COLONOSCOPY;  Surgeon: Gordy HERO Albertus, MD;  Location: [facility];  Service: Gastroenterology;  Laterality: N/A;   COLONOSCOPY W/ BIOPSIES AND POLYPECTOMY     Hx: of   COLONOSCOPY WITH PROPOFOL  N/A 10/14/2018   Procedure: COLONOSCOPY WITH PROPOFOL ;  Surgeon: Albertus Gordy HERO, MD;  Location: [facility];  Service: Gastroenterology;  Laterality: N/A;   CORONARY ARTERY BYPASS GRAFT  2000   Sudie Laine, MD   CORONARY STENT PLACEMENT  1998   DEEP BRAIN STIMULATOR PLACEMENT  2004   Right and left VIN stimulator placement (parkinsons)   EYE SURGERY     Cataract removed in 1980s related to wood in eye when 73 years old   FINGER  AMPUTATION     left pointer   IMPLANTABLE CARDIOVERTER DEFIBRILLATOR IMPLANT N/A 11/13/2013   Procedure: IMPLANTABLE CARDIOVERTER DEFIBRILLATOR IMPLANT;  Surgeon: Danelle LELON Birmingham, MD;  Location: [facility] CATH LAB;  Service: Cardiovascular;  Laterality: N/A;   INSERT / REPLACE / REMOVE PACEMAKER     medtronic   LEFT AND RIGHT HEART CATHETERIZATION WITH CORONARY ANGIOGRAM N/A 09/24/2013   Procedure: LEFT AND RIGHT HEART CATHETERIZATION WITH CORONARY ANGIOGRAM;  Surgeon: Lonni JONETTA Cash, MD;  Location: [facility] CATH LAB;  Service: Cardiovascular;  Laterality: N/A;   LITHOTRIPSY     3 different times   POLYPECTOMY  10/14/2018   Procedure: POLYPECTOMY;  Surgeon: Albertus Gordy HERO, MD;  Location: [facility];  Service: Gastroenterology;;   PULSE GENERATOR IMPLANT Right 11/13/2017   Procedure: Right chest implantable pulse generator change;  Surgeon: Unice Pac, MD;  Location: [facility] OR;  Service: Neurosurgery;   Laterality: Right;  Right chest implantable pulse generator change   SUBTHALAMIC STIMULATOR BATTERY REPLACEMENT N/A 09/05/2012   Procedure: Deep brain stimulator battery change;  Surgeon: Pac Unice, MD;  Location: [facility];  Service: Neurosurgery;  Laterality: N/A;  Deep brain stimulator battery change   SUBTHALAMIC STIMULATOR BATTERY REPLACEMENT N/A 06/10/2015   Procedure: Deep Brain stimulator battery change;  Surgeon: Pac Unice, MD;  Location: [facility];  Service: Neurosurgery;  Laterality: N/A;   SUBTHALAMIC STIMULATOR BATTERY REPLACEMENT Right 09/25/2019   Procedure: Deep brain stimulator battery change;  Surgeon: Unice Pac, MD;  Location[facility];  Service: Neurosurgery;  Laterality: Right;   SUBTHALAMIC STIMULATOR BATTERY REPLACEMENT Right 12/15/2022   Procedure: IMPLANTABLE PULSE GENERATOR  BATTERY REPLACEMENT;  Surgeon: Carollee Lani BROCKS, DO;  Location: [facility] OR;  Service: Neurosurgery;  Laterality: Right;  3C   TONSILLECTOMY     Patient Active Problem List   Diagnosis Date Noted   Skin tear of hand without complication 08/03/2023   Delirium 05/29/2023   Healthcare maintenance 04/08/2023   Cerumen impaction 04/08/2023   Hordeolum 03/21/2023   Pancreatic cyst 03/05/2022   Lower respiratory infection 02/20/2022   Lobar pneumonia 02/20/2022   Sore throat 01/12/2022   Cyst of perianal area 01/12/2022   Strep pharyngitis 12/16/2021   Wheezing 12/16/2021   DOE (dyspnea on exertion) 12/16/2021   Parkinson's disease with EBS (electrical brain stimulation) [G20.A1] 12/05/2021   Fall at home 06/15/2021   Asthma    Skin lesion 05/30/2019   CAD (coronary artery disease) 08/25/2018   Diabetes mellitus without complication (HCC) 08/15/2018   Thrombocytopenia 07/08/2017   Gout, unspecified 06/06/2017   Dysuria 05/25/2017   Microscopic hematuria 05/25/2017   CKD (chronic kidney disease) stage 3, GFR 30-59 ml/min (HCC) 04/12/2017   Neuropathy 02/25/2017   Urinary frequency  09/19/2016   Constipation 11/27/2015   Medicare annual wellness visit, initial 09/23/2015   Advance care planning 09/23/2015   Spinal stenosis of lumbar region 01/26/2015   History of colonic polyps    Restless leg syndrome 08/11/2014   ICD- MDT, implanted 11/13/13 11/14/2013   Chronic systolic CHF (congestive heart failure) (HCC) 11/13/2013   SOB (shortness of breath), mostly on exertion 06/05/2013   Acute on chronic combined systolic and diastolic congestive heart failure, NYHA class 4 (HCC) 05/12/2013   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   Umbilical hernia 01/27/2013   Elevated PSA 12/04/2012   Other malaise and fatigue 12/04/2012   PD (Parkinson's disease) (HCC) 08/19/2012   OSA on CPAP 04/17/2012   Dysautonomia (HCC) 04/17/2012   Obesity, morbid (HCC) 04/17/2012  Hypothyroidism following radioiodine therapy 10/16/2011   GERD 05/11/2009   Type 2 diabetes mellitus with chronic kidney disease, with long-term current use of insulin  (HCC) 04/26/2009   Back pain 08/18/2008   OTHER SPEC FORMS CHRONIC ISCHEMIC HEART DISEASE 04/24/2008   HYPERPLASIA PROSTATE UNS W/O UR OBST & OTH LUTS 09/02/2007   NEPHROLITHIASIS, HX OF 09/02/2007   Ventral hernia 12/28/2006   Hyperlipidemia 08/31/2006   HTN (hypertension) 08/31/2006   Hx of CABG x 3 in 2000 08/31/2006    ONSET DATE: 09/03/2023 (referral date)   REFERRING DIAG: G20.B2 (ICD-10-CM) - Parkinson's disease with dyskinesia and fluctuating manifestations (HCC)   THERAPY DIAG:  Unsteadiness on feet  Other symptoms and signs involving the nervous system  Abnormal posture  Other symptoms and signs involving the musculoskeletal system  Rationale for Evaluation and Treatment: Rehabilitation  SUBJECTIVE:   SUBJECTIVE STATEMENT: Goes by [facility] I can't hear Wife reports a new fall this morning. Pt reports he was getting up from toilet and started to walk and fell Pt accompanied by: spouse Nadia)   PERTINENT HISTORY: AICD  present, CAD, deaf in R ear, DM II, GERD, MI, PD (1999), DBS (2003)    PRECAUTIONS: Fall, ICD/Pacemaker, and Other: DBS   WEIGHT BEARING RESTRICTIONS: No  PAIN:  Are you having pain? No  FALLS: Has patient fallen in last 6 months? Yes. Number of falls 7  LIVING ENVIRONMENT: Lives with: lives with their spouse and Oldest Daughter and her husband Lives in: ONE level home Stairs: Ramped entry  Has following equipment at home: Environmental consultant - 2 wheeled, Environmental consultant - 4 wheeled, Wheelchair (manual), [employer], Grab bars, and U-Step walker, urinal at night   PLOF: Requires assistive device for independence, Needs assistance with ADLs, Needs assistance with gait, and Needs assistance with transfers  PATIENT GOALS: improve safety, reduce falls  OBJECTIVE:  Note: Objective measures were completed at Evaluation unless otherwise noted.  HAND DOMINANCE: Right  ADLs: Overall ADLs: overall mod assist Transfers/ambulation related to ADLs: uses walker in house, U-Step for community Eating: dep for cutting food, mod I feeding self Grooming: mod I for brushing teeth, wife shaves him  UB Dressing: max assist LB Dressing: max assist - pt pulls up pants once started, wife does shoes and socks d/t pt gets SOB bending over Toileting: assist for perineal care/hygiene and for clothes management Bathing: mod assist overall - Pt can wash UB, wife does from waist down/LB Tub Shower transfers: mod I w/ grab bars, walk in shower and shower seat Equipment: Grab bars and built in shower seat  IADLs: dependent (wife or daughter completing)  Community mobility: does not drive Medication management: wife takes care of Handwriting: 75% legible and in print (name only), less legibility signing   MOBILITY STATUS: regular walker to bathroom, U-Step in community. Wife reports using rollator in kitchen - therapist advised against this as regular rollator rolls too fast and pt cannot stay close enough  POSTURE COMMENTS:   forward head  FUNCTIONAL OUTCOME MEASURES: Physical performance test: PPT#2 (simulated eating) 21.35 Pt requires max assist for jacket d/t deficits and decreased balance Pt does not hook/unhook buttons  COORDINATION: 9 Hole Peg test: Right: 91.41 sec (slower d/t index finger amputation, occasional assist from Lt hand); Left: 78.41 sec Box and Blocks:  Right 24 blocks, Left 19 blocks  UE ROM:  WFL elbows distally,  bilateral shoulder flexion approx 80% w/ stiffness bilateral shoulders   SENSATION: WFL   COGNITION: Overall cognitive status: History of cognitive  impairments - at baseline and cognition/memory decline over several years. 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  Recommended pt use urinal until wife gets up to assist patient.  Discussed some strategies from getting up from fall and advised pt/wife to do as P.T. previously instructed  Practiced opening various containers, lids, zip loc bags w/ cues prn. Pt able to do all tops however cued for big turns and repositioning hand w/ each turn for larger twist lids  Practiced writing name - cued to slow down and write big to maintain legibility as pt w/ less legibility if speeding up. Pt able to write name at 90% legibility w/ min cues  Assessed PPT #2 and writing goal - see LTG section     PATIENT EDUCATION: Education details: see above Person educated: Patient and Spouse Education method: Explanation, Demonstration, Verbal cues,  and Handouts Education comprehension: verbalized understanding, returned demonstration, verbal cues required, tactile cues required, and needs further education  HOME EXERCISE PROGRAM: 10/09/23: bag ex's, PWR! Hands push 10/16/23: coordination HEP, ADL strategies 10/24/23: ways to prevent future PD related complications 10/30/23: handwriting strategies, POP info   GOALS: Goals reviewed with patient? Yes  SHORT TERM GOALS: Target date: 10/26/23  Pt independent with modified PD specific HEP (bag ex's, PWR! Hands push only, 3-4 coordination ex's)  Baseline: Goal status: MET  2.  Pt/family will verbalize understanding of adaptive strategies to increase ease/safety with ADLS/IADLS  (dressing, holding fork/spoon at center not end, writing)  Baseline:  Goal status: MET   3.  Pt/family will verbalize and id 2-3 techniques to prevent falls  Baseline:  Goal status: MET  4.  Pt/family to verbalize understanding with A/E to increase ease/safety with ADLS (LE dressing, opening containers, etc) Baseline:  Goal status: MET    LONG TERM GOALS: Target date: 11/26/23  Pt will verbalize understanding of ways to further PD related complications and appropriate community resources prn  Baseline:  Goal status: MET  2.  Improve BUE function as evidenced by increasing Box & Blocks score by 3 blocks Baseline: RUE = 24, LUE = 19 Goal status: INITIAL  3.  Pt will demonstrate improved ease with feeding as evidenced by decreasing PPT#2 by 3 secs  Baseline: 21.35 sec Goal status: MET (10/31/23 = 18 sec)   4.  Pt to write name in print at 90% legibility Baseline: 75% legibility Goal status: MET w/ cues to slow down and keep big    ASSESSMENT:  CLINICAL IMPRESSION: Patient  seen today for occupational therapy treatment for Parkinson's disease diagnosed in 1999. Pt presents today with bradykinesia, dysarthria, significantly impaired balance, decreased cognition, and slower/difficulty with assist  needed for ADLS. Pt limited by deficits, however has met all STGs and 3/4 LTG's at this time. SABRA    PERFORMANCE DEFICITS: in functional skills including ADLs, coordination, dexterity, ROM, strength, Fine motor control, Gross motor control, hearing, mobility, body mechanics, endurance, decreased knowledge of precautions, decreased knowledge of use of DME, and UE functional use, cognitive skills including attention, memory, problem solving, and safety awareness, and psychosocial skills including coping strategies and environmental adaptation.   IMPAIRMENTS: are limiting patient from ADLs, IADLs, and social participation.   COMORBIDITIES:  may have co-morbidities  that affects occupational performance. Patient will benefit from skilled OT to address above impairments and improve overall function.  MODIFICATION OR ASSISTANCE TO COMPLETE EVALUATION: Min-Moderate modification of tasks or assist with assess necessary to complete an evaluation.  OT OCCUPATIONAL PROFILE AND HISTORY: Detailed assessment: Review of records and additional review of physical, cognitive, psychosocial history related to current functional performance.  CLINICAL DECISION MAKING: Moderate - several treatment options, min-mod task modification necessary  REHAB POTENTIAL: Fair severity of deficits, poor carryover/compliance at home  EVALUATION COMPLEXITY: Moderate    PLAN:  OT FREQUENCY: 2x/week  OT DURATION: 8 weeks (anticipate only 4-6 weeks)   PLANNED INTERVENTIONS: 97535 self care/ADL training, 02889 therapeutic exercise, 97530 therapeutic activity, 97112 neuromuscular re-education, 97140 manual therapy, 97010 moist heat, 97760 Orthotic Initial, 97763 Orthotic/Prosthetic subsequent, balance training, functional mobility training, visual/perceptual remediation/compensation, energy conservation, coping strategies training, patient/family education, and DME and/or AE instructions  RECOMMENDED OTHER SERVICES: none at this  time  CONSULTED AND AGREED WITH PLAN OF CARE: Patient and family member/caregiver  PLAN FOR NEXT SESSION:  assess remaining LTG, review safety/fall prevention and ADL strategies - d/c next week  Burnard JINNY Roads, OT 10/31/2023, 2:15 PM

## 2023-11-04 ENCOUNTER — Ambulatory Visit: Payer: Self-pay | Admitting: Family Medicine

## 2023-11-05 ENCOUNTER — Ambulatory Visit: Admitting: Occupational Therapy

## 2023-11-05 ENCOUNTER — Encounter: Payer: Self-pay | Admitting: Occupational Therapy

## 2023-11-05 DIAGNOSIS — R29818 Other symptoms and signs involving the nervous system: Secondary | ICD-10-CM

## 2023-11-05 DIAGNOSIS — R2681 Unsteadiness on feet: Secondary | ICD-10-CM

## 2023-11-05 DIAGNOSIS — R29898 Other symptoms and signs involving the musculoskeletal system: Secondary | ICD-10-CM

## 2023-11-05 DIAGNOSIS — R293 Abnormal posture: Secondary | ICD-10-CM | POA: Diagnosis not present

## 2023-11-05 DIAGNOSIS — R278 Other lack of coordination: Secondary | ICD-10-CM | POA: Diagnosis not present

## 2023-11-05 DIAGNOSIS — R41840 Attention and concentration deficit: Secondary | ICD-10-CM | POA: Diagnosis not present

## 2023-11-05 NOTE — Therapy (Signed)
 OUTPATIENT OCCUPATIONAL THERAPY [facility]/DISCHARGE  Patient Name: Henry [name] MRN: [id number] DOB:10/07/1950, 73 y.o., male Today's Date: 11/05/2023  PCP: Cleatus Arlyss RAMAN, MD REFERRING PROVIDER: Carilyn Prentice BRAVO, MD    OCCUPATIONAL THERAPY DISCHARGE SUMMARY  Visits from Start of Care: 9  Current functional level related to goals / functional outcomes: See below - pt has met all goals and has improved both in ADL participation and level, UE function, and overall decreased falls   Remaining deficits: Safety Balance  Bradykinesia Rigidity   Education / Equipment: See below education/HEP section   Patient agrees to discharge. Patient goals were met. Patient is being discharged due to meeting the stated rehab goals..     END OF SESSION:  OT End of Session - 11/05/23 1232     Visit Number 9    Number of Visits 16    Date for Recertification  11/26/23    Authorization Type MCR    Progress Note Due on Visit 10    OT Start Time 1230    OT Stop Time 1310    OT Time Calculation (min) 40 min    Equipment Utilized During Treatment U-step    Activity Tolerance Patient tolerated treatment well    Behavior During Therapy WFL for tasks assessed/performed          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)   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   Dysrhythmia    a-fib   GERD (gastroesophageal reflux disease)    Glaucoma    right eye   History of kidney stones    multiple   HLD (hyperlipidemia)    HTN (hypertension)    pt denies 08/19/12   Hyperplasia, prostate    Hypothyroidism    MI  (myocardial infarction) (HCC)    Dr Pietro 2000, x3vessels bypass   Neuromuscular disorder (HCC)    Parkinson's Disease   OSA (obstructive sleep apnea)    AHI-28,on CPAP, noncompliant with CPAP   Parkinson disease (HCC)    1999   PONV (postoperative nausea and vomiting)    Restless legs    Shortness of breath    Hx: of at all times   UTI (lower urinary tract infection) 09/15/2012   Klebsiella   Ventral hernia    Walker as ambulation aid    also uses wheelchair at home/when going out   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   BIOPSY  10/14/2018   Procedure: BIOPSY;  Surgeon: Albertus Gordy HERO, MD;  Location: [facility];  Service: Gastroenterology;;   CATARACT EXTRACTION W/ INTRAOCULAR LENS IMPLANT     Hx: of right eye   CATARACT EXTRACTION W/ INTRAOCULAR LENS IMPLANT Left 2018   COLONOSCOPY N/A 10/13/2014   Procedure: COLONOSCOPY;  Surgeon: Gordy HERO Albertus, MD;  Location: [facility];  Service: Gastroenterology;  Laterality: N/A;   COLONOSCOPY W/ BIOPSIES AND POLYPECTOMY     Hx: of  COLONOSCOPY WITH PROPOFOL  N/A 10/14/2018   Procedure: COLONOSCOPY WITH PROPOFOL ;  Surgeon: Albertus Gordy HERO, MD;  Location: [facility];  Service: Gastroenterology;  Laterality: N/A;   CORONARY ARTERY BYPASS GRAFT  2000   Sudie Laine, MD   CORONARY STENT PLACEMENT  1998   DEEP BRAIN STIMULATOR PLACEMENT  2004   Right and left VIN stimulator placement (parkinsons)   EYE SURGERY     Cataract removed in 1980s related to wood in eye when 73 years old   FINGER AMPUTATION     left pointer   IMPLANTABLE CARDIOVERTER DEFIBRILLATOR IMPLANT N/A 11/13/2013   Procedure: IMPLANTABLE CARDIOVERTER DEFIBRILLATOR IMPLANT;  Surgeon: Danelle LELON Birmingham, MD;  Location: [facility] CATH LAB;  Service: Cardiovascular;  Laterality: N/A;   INSERT / REPLACE / REMOVE PACEMAKER     medtronic   LEFT AND RIGHT HEART CATHETERIZATION WITH CORONARY ANGIOGRAM N/A 09/24/2013    Procedure: LEFT AND RIGHT HEART CATHETERIZATION WITH CORONARY ANGIOGRAM;  Surgeon: Lonni JONETTA Cash, MD;  Location: [facility] LAB;  Service: Cardiovascular;  Laterality: N/A;   LITHOTRIPSY     3 different times   POLYPECTOMY  10/14/2018   Procedure: POLYPECTOMY;  Surgeon: Albertus Gordy HERO, MD;  Location: [facility];  Service: Gastroenterology;;   PULSE GENERATOR IMPLANT Right 11/13/2017   Procedure: Right chest implantable pulse generator change;  Surgeon: Unice Pac, MD;  Location: [facility] OR;  Service: Neurosurgery;  Laterality: Right;  Right chest implantable pulse generator change   SUBTHALAMIC STIMULATOR BATTERY REPLACEMENT N/A 09/05/2012   Procedure: Deep brain stimulator battery change;  Surgeon: Pac Unice, MD;  Location: [facility];  Service: Neurosurgery;  Laterality: N/A;  Deep brain stimulator battery change   SUBTHALAMIC STIMULATOR BATTERY REPLACEMENT N/A 06/10/2015   Procedure: Deep Brain stimulator battery change;  Surgeon: Pac Unice, MD;  Location: [facility];  Service: Neurosurgery;  Laterality: N/A;   SUBTHALAMIC STIMULATOR BATTERY REPLACEMENT Right 09/25/2019   Procedure: Deep brain stimulator battery change;  Surgeon: Unice Pac, MD;  Location: [facility] OR;  Service: Neurosurgery;  Laterality: Right;   SUBTHALAMIC STIMULATOR BATTERY REPLACEMENT Right 12/15/2022   Procedure: IMPLANTABLE PULSE GENERATOR  BATTERY REPLACEMENT;  Surgeon: Carollee Lani BROCKS, DO;  Location: [facility] OR;  Service: Neurosurgery;  Laterality: Right;  3C   TONSILLECTOMY     Patient Active Problem List   Diagnosis Date Noted   Skin tear of hand without complication 08/03/2023   Delirium 05/29/2023   Healthcare maintenance 04/08/2023   Cerumen impaction 04/08/2023   Hordeolum 03/21/2023   Pancreatic cyst 03/05/2022   Lower respiratory infection 02/20/2022   Lobar pneumonia 02/20/2022   Sore throat 01/12/2022   Cyst of perianal area 01/12/2022   Strep pharyngitis 12/16/2021   Wheezing 12/16/2021    DOE (dyspnea on exertion) 12/16/2021   Parkinson's disease with EBS (electrical brain stimulation) [G20.A1] 12/05/2021   Fall at home 06/15/2021   Asthma    Skin lesion 05/30/2019   CAD (coronary artery disease) 08/25/2018   Diabetes mellitus without complication (HCC) 08/15/2018   Thrombocytopenia 07/08/2017   Gout, unspecified 06/06/2017   Dysuria 05/25/2017   Microscopic hematuria 05/25/2017   CKD (chronic kidney disease) stage 3, GFR 30-59 ml/min (HCC) 04/12/2017   Neuropathy 02/25/2017   Urinary frequency 09/19/2016   Constipation 11/27/2015   Medicare annual wellness visit, initial 09/23/2015   Advance care planning 09/23/2015   Spinal stenosis of lumbar region 01/26/2015   History of colonic polyps    Restless leg syndrome 08/11/2014   ICD- MDT, implanted  11/13/13 11/14/2013   Chronic systolic CHF (congestive heart failure) (HCC) 11/13/2013   SOB (shortness of breath), mostly on exertion 06/05/2013   Acute on chronic combined systolic and diastolic congestive heart failure, NYHA class 4 (HCC) 05/12/2013   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   Umbilical hernia 01/27/2013   Elevated PSA 12/04/2012   Other malaise and fatigue 12/04/2012   PD (Parkinson's disease) (HCC) 08/19/2012   OSA on CPAP 04/17/2012   Dysautonomia (HCC) 04/17/2012   Obesity, morbid (HCC) 04/17/2012   Hypothyroidism following radioiodine therapy 10/16/2011   GERD 05/11/2009   Type 2 diabetes mellitus with chronic kidney disease, with long-term current use of insulin  (HCC) 04/26/2009   Back pain 08/18/2008   OTHER SPEC FORMS CHRONIC ISCHEMIC HEART DISEASE 04/24/2008   HYPERPLASIA PROSTATE UNS W/O UR OBST & OTH LUTS 09/02/2007   NEPHROLITHIASIS, HX OF 09/02/2007   Ventral hernia 12/28/2006   Hyperlipidemia 08/31/2006   HTN (hypertension) 08/31/2006   Hx of CABG x 3 in 2000 08/31/2006    ONSET DATE: 09/03/2023 (referral date)   REFERRING DIAG: G20.B2 (ICD-10-CM) - Parkinson's disease with dyskinesia  and fluctuating manifestations (HCC)   THERAPY DIAG:  Unsteadiness on feet  Other symptoms and signs involving the nervous system  Abnormal posture  Other symptoms and signs involving the musculoskeletal system  Attention and concentration deficit  Rationale for Evaluation and Treatment: Rehabilitation  SUBJECTIVE:   SUBJECTIVE STATEMENT: Goes by Marcey No new falls since the one last week  Pt accompanied by: spouse Nadia)   PERTINENT HISTORY: AICD present, CAD, deaf in R ear, DM II, GERD, MI, PD (1999), DBS (2003)    PRECAUTIONS: Fall, ICD/Pacemaker, and Other: DBS   WEIGHT BEARING RESTRICTIONS: No  PAIN:  Are you having pain? No  FALLS: Has patient fallen in last 6 months? Yes. Number of falls 7  LIVING ENVIRONMENT: Lives with: lives with their spouse and Oldest Daughter and her husband Lives in: ONE level home Stairs: Ramped entry  Has following equipment at home: Environmental Consultant - 2 wheeled, Environmental Consultant - 4 wheeled, Wheelchair (manual), Graybar electric, Grab bars, and U-Step walker, urinal at night   PLOF: Requires assistive device for independence, Needs assistance with ADLs, Needs assistance with gait, and Needs assistance with transfers  PATIENT GOALS: improve safety, reduce falls  OBJECTIVE:  Note: Objective measures were completed at Evaluation unless otherwise noted.  HAND DOMINANCE: Right  ADLs: Overall ADLs: overall mod assist Transfers/ambulation related to ADLs: uses walker in house, U-Step for community Eating: dep for cutting food, mod I feeding self Grooming: mod I for brushing teeth, wife shaves him  UB Dressing: max assist LB Dressing: max assist - pt pulls up pants once started, wife does shoes and socks d/t pt gets SOB bending over Toileting: assist for perineal care/hygiene and for clothes management Bathing: mod assist overall - Pt can wash UB, wife does from waist down/LB Tub Shower transfers: mod I w/ grab bars, walk in shower and shower  seat Equipment: Grab bars and built in shower seat  IADLs: dependent (wife or daughter completing)  Community mobility: does not drive Medication management: wife takes care of Handwriting: 75% legible and in print (name only), less legibility signing   MOBILITY STATUS: regular walker to bathroom, U-Step in community. Wife reports using rollator in kitchen - therapist advised against this as regular rollator rolls too fast and pt cannot stay close enough  POSTURE COMMENTS:  forward head  FUNCTIONAL OUTCOME MEASURES: Physical performance test: PPT#2 (simulated eating) 21.35  Pt requires max assist for jacket d/t deficits and decreased balance Pt does not hook/unhook buttons  COORDINATION: 9 Hole Peg test: Right: 91.41 sec (slower d/t index finger amputation, occasional assist from Lt hand); Left: 78.41 sec Box and Blocks:  Right 24 blocks, Left 19 blocks  UE ROM:  WFL elbows distally,  bilateral shoulder flexion approx 80% w/ stiffness bilateral shoulders   SENSATION: WFL   COGNITION: Overall cognitive status: History of cognitive impairments - at baseline and cognition/memory decline over several years. 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  Pt improved in Box & Blocks score by 6 blocks bilaterally!   Reviewed strategies for donning/doffing jacket either seated or standing w/ back against counter and min to mod assist   Pt/wife already have shoe funnel and adapted shoe laces for independence donning/doffing shoes seated. Pt also doing better with donning shirt and pants  (seated). Will still need some assistance to stand and pull up over hips d/t decreased balance.   Pt also seated to perform bean bag toss (per pt request) alternating UE's,  to front and to side incorporating cross body reaching for trunk rotation    PATIENT EDUCATION: Education details: see above Person educated: Patient and Spouse Education method: Explanation, Demonstration, Verbal cues, and Handouts Education comprehension: verbalized understanding, returned demonstration, verbal cues required, tactile cues required, and needs further education  HOME EXERCISE PROGRAM: 10/09/23: bag ex's, PWR! Hands push 10/16/23: coordination HEP, ADL strategies 10/24/23: ways to prevent future PD related complications 10/30/23: handwriting strategies, POP info   GOALS: Goals reviewed with patient? Yes  SHORT TERM GOALS: Target date: 10/26/23  Pt independent with modified PD specific HEP (bag ex's, PWR! Hands push only, 3-4 coordination ex's)  Baseline: Goal status: MET  2.  Pt/family will verbalize understanding of adaptive strategies to increase ease/safety with ADLS/IADLS  (dressing, holding fork/spoon at center not end, writing)  Baseline:  Goal status: MET   3.  Pt/family will verbalize and id 2-3 techniques to prevent falls  Baseline:  Goal status: MET  4.  Pt/family to verbalize understanding with A/E to increase ease/safety with ADLS (LE dressing, opening containers, etc) Baseline:  Goal status: MET    LONG TERM GOALS: Target date: 11/26/23  Pt will verbalize understanding of ways to further PD related complications and appropriate community resources prn  Baseline:  Goal status: MET  2.  Improve BUE function as evidenced by increasing Box & Blocks score by 3 blocks Baseline: RUE = 24, LUE = 19 Goal status: MET (RUE = 30, LUE = 25)  3.  Pt  will demonstrate improved ease with feeding as evidenced by decreasing PPT#2 by 3 secs  Baseline: 21.35 sec Goal status: MET (10/31/23  = 18 sec)   4.  Pt to write name in print at 90% legibility Baseline: 75% legibility Goal status: MET w/ cues to slow down and keep big    ASSESSMENT:  CLINICAL IMPRESSION: Patient has met all goals at this time. Pt has improved in ADL participation/function, UE function, and overall decreased falls.    PERFORMANCE DEFICITS: in functional skills including ADLs, coordination, dexterity, ROM, strength, Fine motor control, Gross motor control, hearing, mobility, body mechanics, endurance, decreased knowledge of precautions, decreased knowledge of use of DME, and UE functional use, cognitive skills including attention, memory, problem solving, and safety awareness, and psychosocial skills including coping strategies and environmental adaptation.   IMPAIRMENTS: are limiting patient from ADLs, IADLs, and social participation.   COMORBIDITIES:  may have co-morbidities  that affects occupational performance. Patient will benefit from skilled OT to address above impairments and improve overall function.  MODIFICATION OR ASSISTANCE TO COMPLETE EVALUATION: Min-Moderate modification of tasks or assist with assess necessary to complete an evaluation.  OT OCCUPATIONAL PROFILE AND HISTORY: Detailed assessment: Review of records and additional review of physical, cognitive, psychosocial history related to current functional performance.  CLINICAL DECISION MAKING: Moderate - several treatment options, min-mod task modification necessary  REHAB POTENTIAL: Fair severity of deficits, poor carryover/compliance at home  EVALUATION COMPLEXITY: Moderate    PLAN:  OT FREQUENCY: 2x/week  OT DURATION: 8 weeks (anticipate only 4-6 weeks)   PLANNED INTERVENTIONS: 97535 self care/ADL training, 02889 therapeutic exercise, 97530 therapeutic activity, 97112 neuromuscular re-education, 97140 manual therapy, 97010 moist heat, 97760 Orthotic Initial, 97763 Orthotic/Prosthetic subsequent, balance training,  functional mobility training, visual/perceptual remediation/compensation, energy conservation, coping strategies training, patient/family education, and DME and/or AE instructions  RECOMMENDED OTHER SERVICES: none at this time  CONSULTED AND AGREED WITH PLAN OF CARE: Patient and family member/caregiver  PLAN  D/C O.T.   Burnard JINNY Roads, OT 11/05/2023, 1:25 PM

## 2023-11-07 NOTE — Progress Notes (Signed)
 "  Subjective:    Patient ID: Henry [name], male    DOB: [birth year], 73 y.o.   MRN: [id number]  HPI Discussed the use of AI scribe software for clinical note transcription with the patient, who gave verbal consent to proceed.  History of Present Illness Henry [name] is a 73 year old male with Parkinson's disease who presents for follow-up on Botox  treatment for muscle dystonia.  He has been receiving Botox  injections for approximately a year to address muscle dystonia associated with an additional movement disorder related to his Parkinson's disease. The injections are administered every three months, targeting muscles in his right leg that contribute to his foot turning over.  Despite these treatments, the patient feels there is no noticeable improvement in his walking, except when he exercises, which he feels helps him loosen up. He completed physical therapy recently and continues to perform exercises independently. He describes feeling 'pumped up' after Botox , though he is uncertain of its impact on his symptoms.  His wife notes that his foot does not turn over as much. However, he still experiences issues with other movements.  He uses a walker for mobility and requires support to stabilize his legs when standing. He has been on a consistent schedule of Botox  injections, with the last one administered six weeks ago. He is currently in a period where the effects of the Botox  should be active.    09/27/23 Botox  for focal dystonia Right tibialis posterior 75 units Right peroneus longus 75 units Right gluteus medius 75 units Right Piriformis 75 Pain Inventory Average Pain 0 Pain Right Now 0 My pain is no pain  In the last 24 hours, has pain interfered with the following? General activity 0 Relation with others 0 Enjoyment of life 0 What TIME of day is your pain at its worst? no pain Sleep (in general) Fair no pain Pain is worse with: no pain Pain improves with: no  pain Relief from Meds: no pain  Family History  Problem Relation Age of Onset   Aneurysm Mother    Alcoholism Father    HIV/AIDS Brother 23       AIDS   Healthy Sister    Healthy Child    Peripheral vascular disease Other    Arthritis Other    Healthy Sister    Healthy Child    Healthy Child    Diabetes Neg Hx    Heart disease Neg Hx    Colon cancer Neg Hx    Prostate cancer Neg Hx    Social History   Socioeconomic History   Marital status: Married    Spouse name: [name]   Number of children: 2   Years of education: 12   Highest education level: High school graduate  Occupational History   Occupation: DISABLED    Comment: [employer]  Tobacco Use   Smoking status: Never    Passive exposure: Never   Smokeless tobacco: Never  Vaping Use   Vaping status: Never Used  Substance and Sexual Activity   Alcohol use: Not Currently   Drug use: No   Sexual activity: Not Currently  Other Topics Concern   Not on file  Social History Narrative   From [place]   Retired/disability archivist   Likes to fish.     Married 1972   3 kids   [employer] fan   Social Drivers of Health   Financial Resource Strain: Low Risk  (04/05/2023)   Overall Physicist, Medical  Strain (CARDIA)    Difficulty of Paying Living Expenses: Not hard at all  Food Insecurity: No Food Insecurity (04/05/2023)   Hunger Vital Sign    Worried About Running Out of Food in the Last Year: Never true    Ran Out of Food in the Last Year: Never true  Transportation Needs: No Transportation Needs (04/05/2023)   PRAPARE - Administrator, Civil Service (Medical): No    Lack of Transportation (Non-Medical): No  Physical Activity: Inactive (04/05/2023)   Exercise Vital Sign    Days of Exercise per Week: 0 days    Minutes of Exercise per Session: 0 min  Stress: No Stress Concern Present (04/05/2023)   [employer] of Occupational Health - Occupational Stress Questionnaire     Feeling of Stress : Not at all  Social Connections: Moderately Integrated (04/05/2023)   Social Connection and Isolation Panel    Frequency of Communication with Friends and Family: More than three times a week    Frequency of Social Gatherings with Friends and Family: More than three times a week    Attends Religious Services: 1 to 4 times per year    Active Member of [employer] or Organizations: No    Attends Banker Meetings: Never    Marital Status: Married   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   BIOPSY  10/14/2018   Procedure: BIOPSY;  Surgeon: Albertus Gordy HERO, MD;  Location: [facility];  Service: Gastroenterology;;   CATARACT EXTRACTION W/ INTRAOCULAR LENS IMPLANT     Hx: of right eye   CATARACT EXTRACTION W/ INTRAOCULAR LENS IMPLANT Left 2018   COLONOSCOPY N/A 10/13/2014   Procedure: COLONOSCOPY;  Surgeon: Gordy HERO Albertus, MD;  Location: [facility];  Service: Gastroenterology;  Laterality: N/A;   COLONOSCOPY W/ BIOPSIES AND POLYPECTOMY     Hx: of   COLONOSCOPY WITH PROPOFOL  N/A 10/14/2018   Procedure: COLONOSCOPY WITH PROPOFOL ;  Surgeon: Albertus Gordy HERO, MD;  Location: [facility];  Service: Gastroenterology;  Laterality: N/A;   CORONARY ARTERY BYPASS GRAFT  2000   Sudie Laine, MD   CORONARY STENT PLACEMENT  1998   DEEP BRAIN STIMULATOR PLACEMENT  2004   Right and left VIN stimulator placement (parkinsons)   EYE SURGERY     Cataract removed in 1980s related to wood in eye when 73 years old   FINGER AMPUTATION     left pointer   IMPLANTABLE CARDIOVERTER DEFIBRILLATOR IMPLANT N/A 11/13/2013   Procedure: IMPLANTABLE CARDIOVERTER DEFIBRILLATOR IMPLANT;  Surgeon: Danelle LELON Birmingham, MD;  Location: [facility] CATH LAB;  Service: Cardiovascular;  Laterality: N/A;   INSERT / REPLACE / REMOVE PACEMAKER     medtronic   LEFT AND RIGHT HEART CATHETERIZATION WITH CORONARY ANGIOGRAM N/A 09/24/2013   Procedure: LEFT AND  RIGHT HEART CATHETERIZATION WITH CORONARY ANGIOGRAM;  Surgeon: Lonni JONETTA Cash, MD;  Location: [facility] CATH LAB;  Service: Cardiovascular;  Laterality: N/A;   LITHOTRIPSY     3 different times   POLYPECTOMY  10/14/2018   Procedure: POLYPECTOMY;  Surgeon: Albertus Gordy HERO, MD;  Location: [facility];  Service: Gastroenterology;;   PULSE GENERATOR IMPLANT Right 11/13/2017   Procedure: Right chest implantable pulse generator change;  Surgeon: Unice Pac, MD;  Location: [facility] OR;  Service: Neurosurgery;  Laterality: Right;  Right chest implantable pulse generator change   SUBTHALAMIC STIMULATOR BATTERY REPLACEMENT N/A 09/05/2012   Procedure: Deep brain stimulator battery change;  Surgeon: Fairy Levels, MD;  Location: [facility];  Service: Neurosurgery;  Laterality: N/A;  Deep brain stimulator battery change   SUBTHALAMIC STIMULATOR BATTERY REPLACEMENT N/A 06/10/2015   Procedure: Deep Brain stimulator battery change;  Surgeon: Fairy Levels, MD;  Location: [facility];  Service: Neurosurgery;  Laterality: N/A;   SUBTHALAMIC STIMULATOR BATTERY REPLACEMENT Right 09/25/2019   Procedure: Deep brain stimulator battery change;  Surgeon: Levels Fairy, MD;  Location: [facility] OR;  Service: Neurosurgery;  Laterality: Right;   SUBTHALAMIC STIMULATOR BATTERY REPLACEMENT Right 12/15/2022   Procedure: IMPLANTABLE PULSE GENERATOR  BATTERY REPLACEMENT;  Surgeon: Carollee Lani BROCKS, DO;  Location: [facility] OR;  Service: Neurosurgery;  Laterality: Right;  3C   TONSILLECTOMY     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   BIOPSY  10/14/2018   Procedure: BIOPSY;  Surgeon: Albertus Gordy HERO, MD;  Location: [facility];  Service: Gastroenterology;;   CATARACT EXTRACTION W/ INTRAOCULAR LENS IMPLANT     Hx: of right eye   CATARACT EXTRACTION W/ INTRAOCULAR LENS IMPLANT Left 2018   COLONOSCOPY N/A 10/13/2014   Procedure: COLONOSCOPY;  Surgeon: Gordy HERO Albertus, MD;  Location:  [facility];  Service: Gastroenterology;  Laterality: N/A;   COLONOSCOPY W/ BIOPSIES AND POLYPECTOMY     Hx: of   COLONOSCOPY WITH PROPOFOL  N/A 10/14/2018   Procedure: COLONOSCOPY WITH PROPOFOL ;  Surgeon: Albertus Gordy HERO, MD;  Location: [facility];  Service: Gastroenterology;  Laterality: N/A;   CORONARY ARTERY BYPASS GRAFT  2000   Sudie Laine, MD   CORONARY STENT PLACEMENT  1998   DEEP BRAIN STIMULATOR PLACEMENT  2004   Right and left VIN stimulator placement (parkinsons)   EYE SURGERY     Cataract removed in 1980s related to wood in eye when 73 years old   FINGER AMPUTATION     left pointer   IMPLANTABLE CARDIOVERTER DEFIBRILLATOR IMPLANT N/A 11/13/2013   Procedure: IMPLANTABLE CARDIOVERTER DEFIBRILLATOR IMPLANT;  Surgeon: Danelle LELON Birmingham, MD;  Location: [facility] CATH LAB;  Service: Cardiovascular;  Laterality: N/A;   INSERT / REPLACE / REMOVE PACEMAKER     medtronic   LEFT AND RIGHT HEART CATHETERIZATION WITH CORONARY ANGIOGRAM N/A 09/24/2013   Procedure: LEFT AND RIGHT HEART CATHETERIZATION WITH CORONARY ANGIOGRAM;  Surgeon: Lonni JONETTA Cash, MD;  Location: [facility] CATH LAB;  Service: Cardiovascular;  Laterality: N/A;   LITHOTRIPSY     3 different times   POLYPECTOMY  10/14/2018   Procedure: POLYPECTOMY;  Surgeon: Albertus Gordy HERO, MD;  Location: [facility];  Service: Gastroenterology;;   PULSE GENERATOR IMPLANT Right 11/13/2017   Procedure: Right chest implantable pulse generator change;  Surgeon: Levels Fairy, MD;  Location: [facility] OR;  Service: Neurosurgery;  Laterality: Right;  Right chest implantable pulse generator change   SUBTHALAMIC STIMULATOR BATTERY REPLACEMENT N/A 09/05/2012   Procedure: Deep brain stimulator battery change;  Surgeon: Fairy Levels, MD;  Location: [facility];  Service: Neurosurgery;  Laterality: N/A;  Deep brain stimulator battery change   SUBTHALAMIC STIMULATOR BATTERY REPLACEMENT N/A 06/10/2015   Procedure: Deep Brain stimulator battery change;  Surgeon:  Fairy Levels, MD;  Location: [facility];  Service: Neurosurgery;  Laterality: N/A;   SUBTHALAMIC STIMULATOR BATTERY REPLACEMENT Right 09/25/2019   Procedure: Deep brain stimulator battery change;  Surgeon: Levels Fairy, MD;  Location: [facility] OR;  Service: Neurosurgery;  Laterality: Right;   SUBTHALAMIC STIMULATOR BATTERY REPLACEMENT Right 12/15/2022   Procedure: IMPLANTABLE PULSE GENERATOR  BATTERY REPLACEMENT;  Surgeon: Carollee Lani BROCKS, DO;  Location: [facility] OR;  Service: Neurosurgery;  Laterality: Right;  3C   TONSILLECTOMY     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)   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   Dysrhythmia    a-fib   GERD (gastroesophageal reflux disease)    Glaucoma    right eye   History of kidney stones    multiple   HLD (hyperlipidemia)    HTN (hypertension)    pt denies 08/19/12   Hyperplasia, prostate    Hypothyroidism    MI (myocardial infarction) (HCC)    Dr Pietro 2000, [id number] bypass   Neuromuscular disorder (HCC)    Parkinson's Disease   OSA (obstructive sleep apnea)    AHI-28,on CPAP, noncompliant with CPAP   Parkinson disease (HCC)    1999   PONV (postoperative nausea and vomiting)    Restless legs    Shortness of breath    Hx: of at all times   UTI (lower urinary tract infection) 09/15/2012   Klebsiella   Ventral hernia    Walker as ambulation aid    also uses wheelchair at home/when going out   There were no vitals taken for this visit.  Opioid Risk Score:   Fall Risk Score:  `1  Depression screen [facility] 2/9     09/27/2023    1:58 PM 08/09/2023   12:44 PM 06/28/2023    3:03 PM  05/11/2023   11:48 AM 04/05/2023    3:48 PM 02/16/2023   12:55 PM 11/21/2022   12:45 PM  Depression screen PHQ 2/9  Decreased Interest 0 0 0 0 0 1 0  Down, Depressed, Hopeless  0 0 0 0 1 0  PHQ - 2 Score 0 0 0 0 0 2 0  Altered sleeping  0 0      Tired, decreased energy  0 0      Change in appetite  0 0      Feeling bad or failure about yourself   0 0      Trouble concentrating  0 0      Moving slowly or fidgety/restless  2 1      Suicidal thoughts  0 0      PHQ-9 Score  2 1      Difficult doing work/chores  Very difficult Very difficult         Review of Systems  Musculoskeletal:  Positive for gait problem.  Neurological:  Positive for weakness.  All other systems reviewed and are negative.      Objective:   Physical Exam  General no acute distress Mood affect appropriate Extremities without edema Negative straight leg raising bilaterally Motor strength is 4/5 bilateral hip flexor knee extensor ankle dorsiflexor There is decreased hip range of motion bilaterally with painful external rotation. There is no clonus at the ankles.  Patient uses a rolling walker no orthotics.  He has some external rotation at the hip during ambulation  but no foot inversion.  There is no evidence of the instability.  There is good toe clearance.      Assessment & Plan:  Assessment and Plan Assessment & Plan Spasticity associated with Parkinson's disease, predominantly affecting right lower extremity Botox  injections have not significantly improved walking. Difficulty in isolating the exact muscle causing the issue may contribute to the lack of improvement.  This applies specifically to the hip external rotators on the right side - Allow Botox  to wear off completely to assess baseline spasticity and determine the necessity of future injections. - Reassess in 8 weeks to evaluate dystonia and walking ability after Botox  has worn off. - Consider reinjection of the tibialis posterior if foot turnover  worsens. - If walking remains well-managed, consider discontinuing Botox  treatment and monitor for recurrence.    "

## 2023-11-08 ENCOUNTER — Encounter: Payer: Self-pay | Admitting: Physical Medicine & Rehabilitation

## 2023-11-08 ENCOUNTER — Encounter: Attending: Physical Medicine & Rehabilitation | Admitting: Physical Medicine & Rehabilitation

## 2023-11-08 VITALS — BP 103/64 | HR 68 | Ht 69.0 in | Wt 253.4 lb

## 2023-11-08 DIAGNOSIS — G248 Other dystonia: Secondary | ICD-10-CM | POA: Insufficient documentation

## 2023-11-08 NOTE — Patient Instructions (Signed)
  VISIT SUMMARY: Today, we reviewed your ongoing treatment with Botox  injections for muscle spasticity related to your Parkinson's disease. We discussed the current effectiveness of the treatment and planned the next steps.  YOUR PLAN: Dystonia ASSOCIATED WITH PARKINSON'S DISEASE: You have been receiving Botox  injections to help with muscle spasticity in your right leg, but there has been no significant improvement in your walking. -Allow the Botox  to wear off completely to assess your baseline dystonia. -We will reassess your spasticity and walking ability in 8 weeks after the Botox  has worn off. -If your foot turnover worsens, we may consider reinjecting the tibialis posterior muscle. -If your walking remains well-managed, we may consider discontinuing Botox  treatment.                      Contains text generated by [employer]bridge.                                 Contains text generated by Abridge.

## 2023-11-12 ENCOUNTER — Other Ambulatory Visit: Payer: Self-pay | Admitting: Cardiology

## 2023-11-14 ENCOUNTER — Encounter: Payer: Self-pay | Admitting: Endocrinology

## 2023-11-14 ENCOUNTER — Ambulatory Visit (INDEPENDENT_AMBULATORY_CARE_PROVIDER_SITE_OTHER): Admitting: Endocrinology

## 2023-11-14 ENCOUNTER — Ambulatory Visit: Payer: Self-pay | Admitting: Endocrinology

## 2023-11-14 VITALS — BP 122/80 | HR 73 | Resp 20 | Ht 69.0 in | Wt 258.6 lb

## 2023-11-14 DIAGNOSIS — Z794 Long term (current) use of insulin: Secondary | ICD-10-CM | POA: Diagnosis not present

## 2023-11-14 DIAGNOSIS — E1165 Type 2 diabetes mellitus with hyperglycemia: Secondary | ICD-10-CM

## 2023-11-14 LAB — POCT GLYCOSYLATED HEMOGLOBIN (HGB A1C): Hemoglobin A1C: 6.2 % — AB (ref 4.0–5.6)

## 2023-11-14 NOTE — Progress Notes (Unsigned)
 Outpatient Endocrinology Note Makahla Kiser, MD  11/15/23  Patient's Name: Henry [name]    DOB: 09-18-1950    MRN: [id number]                                                    REASON OF VISIT: Follow-up for type 2 diabetes mellitus  PCP: Cleatus Arlyss RAMAN, MD  HISTORY OF PRESENT ILLNESS:   Henry [name] is a 73 y.o. old male with past medical history listed below, is here for follow up of  type 2 diabetes mellitus / hypothyroidism.    Pertinent Diabetes History: Patient was diagnosed with type 2 diabetes mellitus in 2000.  He has been on OmniPod 5 insulin  pump with Dexcom G6.  Patient's wife is helping with insulin  pump management.  Patient has severe Parkinson's disease.  Chronic Diabetes Complications : Retinopathy: no. Last ophthalmology exam was done on annually reportedly. Nephropathy: CKD, on losartan , following with nephrology. Peripheral neuropathy:  yes, taking gabapentin  from PCP.  Has history of numbness in his toes. Coronary artery disease: CAD. Has CHF Stroke: no.  He has Parkinson's disease.  Relevant comorbidities and cardiovascular risk factors: Obesity: yes Body mass index is 38.19 kg/m.  Hypertension: yes Hyperlipidemia. Yes, on statin.   Current / Home Diabetic regimen includes:  Mounjaro  7.5 mg weekly.  OmniPod 5 insulin  Pump setting: Using Humalog  U100.  Wife helps him to manage insulin  pump at home. Basal ( 29.6) MN- 1.1 u/hour 3:30AM-1.1  6AM- 1.3 12PM- 1.3 10PM - 1.1  Bolus CHO Ratio (1unit:CHO) MN- 1:10  Generally Boluses 3-5 units for breakfast, 4-6 lunch and 6-7 units for dinner, 2-3 units for snacks . Generally entering about 45 -55- 65 g of carbohydrate for the 3 meals   Correction/Sensitivity: MN- 1:60  Target: 120   Active insulin  time: 2 hours  Prior diabetic medications: Amaryl  several years ago and probably metformin .  Janumet .  Victoza .  Lantus .  V-Go pump in 2016. Farxiga  was stopped when he had UTI in May  2022.  CONTINUOUS GLUCOSE MONITORING SYSTEM (CGMS) / INSULIN  PUMP INTERPRETATION:                         OmniPod 5 Pump & DEXCOM G6 Sensor Download (Reviewed and summarized below.) Pump: Dexcom G6 and OmniPod 5 Dates: October 23 to November 5 , 2025 for 14 days.   Average total daily insulin :  41 units, Basal: 63%, Bolus: 37%.   Automated mode in Use: 66%      Interpretation : Mostly acceptable blood sugar occasional mild hyperglycemia with blood sugar in low 200 related to late meal bolus otherwise no concerning hypoglycemia.  No hypoglycemia.  Hypoglycemia: Patient has no hypoglycemic episodes. Patient has hypoglycemia awareness,.    Factors modifying glucose control: 1.  Diabetic diet assessment: 3 meals a day.  2.  Staying active or exercising: Not able to exercise due to medical condition.  3.  Medication compliance: compliant all of the time.  # Postablative hypothyroidism.  Patient has hypothyroidism secondary to radioactive iodine treatment for hyperthyroidism in 2013.   Interval history  Insulin  pump and CGM data as reviewed above.  Mostly acceptable blood sugar.  Hemoglobin A1c 6.2%.  Has been taking Mounjaro  and no GI issues.  Has been taking levothyroxine  150 mcg daily.  Denies palpitation.  No new complaints.  He is accompanied by wife in the clinic today.  REVIEW OF SYSTEMS As per history of present illness.   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)   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   Dysrhythmia    a-fib   GERD (gastroesophageal reflux disease)    Glaucoma     right eye   History of kidney stones    multiple   HLD (hyperlipidemia)    HTN (hypertension)    pt denies 08/19/12   Hyperplasia, prostate    Hypothyroidism    MI (myocardial infarction) (HCC)    Dr Pietro 2000, x3vessels bypass   Neuromuscular disorder (HCC)    Parkinson's Disease   OSA (obstructive sleep apnea)    AHI-28,on CPAP, noncompliant with CPAP   Parkinson disease (HCC)    1999   PONV (postoperative nausea and vomiting)    Restless legs    Shortness of breath    Hx: of at all times   UTI (lower urinary tract infection) 09/15/2012   Klebsiella   Ventral hernia    Walker as ambulation aid    also uses wheelchair at home/when going out    PAST SURGICAL HISTORY: 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   BIOPSY  10/14/2018   Procedure: BIOPSY;  Surgeon: Albertus Gordy HERO, MD;  Location: [facility];  Service: Gastroenterology;;   CATARACT EXTRACTION W/ INTRAOCULAR LENS IMPLANT     Hx: of right eye   CATARACT EXTRACTION W/ INTRAOCULAR LENS IMPLANT Left 2018   COLONOSCOPY N/A 10/13/2014   Procedure: COLONOSCOPY;  Surgeon: Gordy HERO Albertus, MD;  Location: [facility];  Service: Gastroenterology;  Laterality: N/A;   COLONOSCOPY W/ BIOPSIES AND POLYPECTOMY     Hx: of   COLONOSCOPY WITH PROPOFOL  N/A 10/14/2018   Procedure: COLONOSCOPY WITH PROPOFOL ;  Surgeon: Albertus Gordy HERO, MD;  Location: [facility];  Service: Gastroenterology;  Laterality: N/A;   CORONARY ARTERY BYPASS GRAFT  2000   Sudie Laine, MD   CORONARY STENT PLACEMENT  1998   DEEP BRAIN STIMULATOR PLACEMENT  2004   Right and left VIN stimulator placement (parkinsons)   EYE SURGERY     Cataract removed in 1980s related to wood in eye when 73 years old   FINGER AMPUTATION     left pointer   IMPLANTABLE CARDIOVERTER DEFIBRILLATOR IMPLANT N/A 11/13/2013   Procedure: IMPLANTABLE CARDIOVERTER DEFIBRILLATOR IMPLANT;  Surgeon: Danelle LELON Waddell, MD;   Location[facility];  Service: Cardiovascular;  Laterality: N/A;   INSERT / REPLACE / REMOVE PACEMAKER     medtronic   LEFT AND RIGHT HEART CATHETERIZATION WITH CORONARY ANGIOGRAM N/A 09/24/2013   Procedure: LEFT AND RIGHT HEART CATHETERIZATION WITH CORONARY ANGIOGRAM;  Surgeon: Lonni JONETTA Cash, MD;  Location: [facility] CATH LAB;  Service: Cardiovascular;  Laterality: N/A;   LITHOTRIPSY     3 different times   POLYPECTOMY  10/14/2018   Procedure: POLYPECTOMY;  Surgeon: Albertus Gordy HERO, MD;  Location: [facility];  Service: Gastroenterology;;   PULSE GENERATOR IMPLANT Right 11/13/2017   Procedure: Right chest implantable pulse generator change;  Surgeon: Unice Pac, MD;  Location: [facility] OR;  Service: Neurosurgery;  Laterality: Right;  Right chest implantable pulse generator change   SUBTHALAMIC STIMULATOR BATTERY REPLACEMENT N/A 09/05/2012   Procedure: Deep brain stimulator battery change;  Surgeon: Pac Unice, MD;  Location: [facility];  Service: Neurosurgery;  Laterality: N/A;  Deep brain stimulator battery change   SUBTHALAMIC STIMULATOR BATTERY REPLACEMENT N/A 06/10/2015   Procedure: Deep Brain stimulator battery change;  Surgeon: Pac Unice, MD;  Location: [facility];  Service: Neurosurgery;  Laterality: N/A;   SUBTHALAMIC STIMULATOR BATTERY REPLACEMENT Right 09/25/2019   Procedure: Deep brain stimulator battery change;  Surgeon: Unice Pac, MD;  Location: [facility] OR;  Service: Neurosurgery;  Laterality: Right;   SUBTHALAMIC STIMULATOR BATTERY REPLACEMENT Right 12/15/2022   Procedure: IMPLANTABLE PULSE GENERATOR  BATTERY REPLACEMENT;  Surgeon: Carollee Lani BROCKS, DO;  Location: [facility] OR;  Service: Neurosurgery;  Laterality: Right;  3C   TONSILLECTOMY      ALLERGIES: Allergies  Allergen Reactions   Penicillins Anaphylaxis and Rash    Because of a history of documented adverse serious drug reaction;Medi Alert bracelet  is recommended PATIENT HAS HAD A PCN REACTION WITH IMMEDIATE RASH,  FACIAL/TONGUE/THROAT SWELLING, SOB, OR LIGHTHEADEDNESS WITH HYPOTENSION:  #  #  YES  #  #  Has patient had a PCN reaction causing severe rash involving mucus membranes or skin necrosis: unknown Has patient had a PCN reaction that required hospitalization NO Has patient had a PCN reaction occurring within the last 10 years: NO   Klonopin  [Clonazepam ] Other (See Comments)    agitation   Peanut-Containing Drug Products Cough   Watermelon [Citrullus Vulgaris] Other (See Comments) and Cough    Tickle in throat    FAMILY HISTORY:  Family History  Problem Relation Age of Onset   Aneurysm Mother    Alcoholism Father    HIV/AIDS Brother 62       AIDS   Healthy Sister    Healthy Child    Peripheral vascular disease Other    Arthritis Other    Healthy Sister    Healthy Child    Healthy Child    Diabetes Neg Hx    Heart disease Neg Hx    Colon cancer Neg Hx    Prostate cancer Neg Hx     SOCIAL HISTORY: Social History   Socioeconomic History   Marital status: Married    Spouse name: [name]   Number of children: 2   Years of education: 12   Highest education level: High school graduate  Occupational History   Occupation: DISABLED    Comment: [employer], [employer]  Tobacco Use   Smoking status: Never    Passive exposure: Never   Smokeless tobacco: Never  Vaping Use   Vaping status: Never Used  Substance and Sexual Activity   Alcohol use: Not Currently   Drug use: No   Sexual activity: Not Currently  Other Topics Concern   Not on file  Social History Narrative   From [place]   Retired/disability archivist   Likes to fish.     Married 1972   3 kids   [place] fan   Social Drivers of Health   Financial Resource Strain: Low Risk  (04/05/2023)   Overall Financial Resource Strain (CARDIA)  Difficulty of Paying Living Expenses: Not hard at all  Food Insecurity: No Food Insecurity (04/05/2023)   Hunger Vital Sign    Worried About Running Out of Food  in the Last Year: Never true    Ran Out of Food in the Last Year: Never true  Transportation Needs: No Transportation Needs (04/05/2023)   PRAPARE - Administrator, Civil Service (Medical): No    Lack of Transportation (Non-Medical): No  Physical Activity: Inactive (04/05/2023)   Exercise Vital Sign    Days of Exercise per Week: 0 days    Minutes of Exercise per Session: 0 min  Stress: No Stress Concern Present (04/05/2023)   [employer] of Occupational Health - Occupational Stress Questionnaire    Feeling of Stress : Not at all  Social Connections: Moderately Integrated (04/05/2023)   Social Connection and Isolation Panel    Frequency of Communication with Friends and Family: More than three times a week    Frequency of Social Gatherings with Friends and Family: More than three times a week    Attends Religious Services: 1 to 4 times per year    Active Member of [employer] or Organizations: No    Attends Engineer, Structural: Never    Marital Status: Married    MEDICATIONS:  Current Outpatient Medications  Medication Sig Dispense Refill   acetaminophen  (TYLENOL ) 500 MG tablet Take 1,000 mg by mouth at bedtime as needed for mild pain or headache.      albuterol  (VENTOLIN  HFA) 108 (90 Base) MCG/ACT inhaler TAKE 2 PUFFS BY MOUTH EVERY 6 HOURS AS NEEDED FOR WHEEZE OR SHORTNESS OF BREATH 18 each 2   allopurinol  (ZYLOPRIM ) 100 MG tablet Take 1.5 tablets (150 mg total) by mouth daily. 135 tablet 3   aspirin  EC 81 MG tablet Take 1 tablet (81 mg total) by mouth daily. Swallow whole.     Carbidopa -Levodopa  ER (SINEMET  CR) 25-100 MG tablet controlled release TAKE 2 TABLETS BY MOUTH 5 TIMES PER DAY AS DIRECTED 900 tablet 0   Cholecalciferol (VITAMIN D) 50 MCG (2000 UT) tablet Take 2,000 Units by mouth daily.      Continuous Blood Gluc Receiver (DEXCOM G6 RECEIVER) DEVI Use to check blood sugar daily 1 each 0   Continuous Glucose Sensor (DEXCOM G6 SENSOR) MISC USE TO CHECK BLOOD  SUGAR DAILY 9 each 3   Continuous Glucose Transmitter (DEXCOM G6 TRANSMITTER) MISC Change every 3 months 1 each 3   dorzolamide-timolol (COSOPT) 22.3-6.8 MG/ML ophthalmic solution Place 1 drop into the right eye 2 (two) times daily.     DULoxetine  (CYMBALTA ) 20 MG capsule Take 1 capsule (20 mg total) by mouth daily. 30 capsule 0   fluticasone -salmeterol (WIXELA INHUB) 250-50 MCG/ACT AEPB INHALE 1 PUFF INTO THE LUNGS IN THE MORNING AND AT BEDTIME**RINSE AFTER USE** 180 each 1   FREESTYLE LITE test strip USE TO CHECK BLOOD SUGAR 4 TIMES DAILY. 150 strip 3   furosemide  (LASIX ) 20 MG tablet TAKE 1 (20MG ) TABLET AND 1/2TAB OF THE 80 MG TABLET ONCE DAILY TO EQUAL 60 MG DAILY 30 tablet 0   furosemide  (LASIX ) 80 MG tablet TAKE 1 TABLET BY MOUTH EVERY DAY 90 tablet 1   icosapent  Ethyl (VASCEPA ) 1 g capsule Take 2 capsules (2 g total) by mouth 2 (two) times daily. 360 capsule 3   Insulin  Disposable Pump (OMNIPOD 5 DEXG7G6 PODS GEN 5) MISC CHANGE POD EVERY 3 DAYS 30 each 3   Insulin  Disposable Pump (OMNIPOD 5 G6  INTRO, GEN 5,) KIT 1 kit by Does not apply route daily. 1 kit 0   insulin  lispro (HUMALOG ) 100 UNIT/ML injection USE MAXIMUM 76 UNITS PER   DAY WITH V-GO PUMP 60 mL 3   levothyroxine  (SYNTHROID ) 150 MCG tablet TAKE 1 TABLET BY MOUTH DAILY BEFORE BREAKFAST. 90 tablet 3   losartan  (COZAAR ) 25 MG tablet Take 1 tablet (25 mg total) by mouth daily. 90 tablet 2   metoprolol  succinate (TOPROL -XL) 50 MG 24 hr tablet TAKE 1 TABLET BY MOUTH EVERY DAY WITH OR IMMEDIATELY FOLLOWING A MEAL 90 tablet 1   mirabegron  ER (MYRBETRIQ ) 25 MG TB24 tablet Take 1 tablet (25 mg total) by mouth daily. 90 tablet 3   nitroGLYCERIN  (NITROSTAT ) 0.4 MG SL tablet Place 1 tablet (0.4 mg total) under the tongue every 5 (five) minutes as needed for chest pain. 25 tablet 12   NUPLAZID  34 MG CAPS TAKE 1 CAPSULE BY MOUTH 1 TIME A DAY 90 capsule 0   OVER THE COUNTER MEDICATION Apply 1 application topically daily as needed (pain).  Theraworx Pain Cream     polyethylene glycol (MIRALAX / GLYCOLAX) packet Take 8.5 g by mouth daily.     potassium chloride  SA (KLOR-CON  M) 20 MEQ tablet TAKE 40 MEQ BY MOUTH IN THE MORNING AND TAKE 20 MEQ IN THE EVENING 270 tablet 1   rosuvastatin  (CRESTOR ) 20 MG tablet TAKE 1 TAB BY MOUTH EVERYDAY AT BEDTIME 90 tablet 3   sennosides-docusate sodium  (SENOKOT-S) 8.6-50 MG tablet Take 1 tablet by mouth daily as needed for constipation. Take along with miralax.     spironolactone  (ALDACTONE ) 25 MG tablet TAKE 1/2 TABLET BY MOUTH EVERY DAY 45 tablet 1   tirzepatide  (MOUNJARO ) 7.5 MG/0.5ML Pen Inject 7.5 mg into the skin once a week. 6 mL 3   vitamin B-12 (CYANOCOBALAMIN ) 1000 MCG tablet Take 1,000 mcg by mouth daily.     No current facility-administered medications for this visit.    PHYSICAL EXAM: Vitals:   11/14/23 1423  BP: 122/80  Pulse: 73  Resp: 20  SpO2: 92%  Weight: 258 lb 9.6 oz (117.3 kg)  Height: 5' 9 (1.753 m)     Body mass index is 38.19 kg/m.  Wt Readings from Last 3 Encounters:  11/14/23 258 lb 9.6 oz (117.3 kg)  11/08/23 253 lb 6.4 oz (114.9 kg)  09/27/23 250 lb (113.4 kg)    General: Well developed, well nourished male in no apparent distress.  HEENT: AT/[place], no external lesions.  Eyes: Conjunctiva clear and no icterus. Neck: Neck supple  Lungs: Respirations not labored Neurologic: Alert, oriented, normal speech Extremities / Skin: Dry.  Psychiatric: Does not appear depressed or anxious  Diabetic Foot Exam - Simple   Simple Foot Form Diabetic Foot exam was performed with the following findings: Yes 11/14/2023  2:50 PM  Visual Inspection See comments: Yes Sensation Testing See comments: Yes Pulse Check See comments: Yes Comments DP palpable bilaterally.Callus on left foot. No ulcer. Monofilament exam diminished on bilateral toes.      LABS Reviewed Lab Results  Component Value Date   HGBA1C 6.2 (A) 11/14/2023   HGBA1C 6.7 (A) 08/14/2023   HGBA1C  6.8 (A) 04/30/2023   Lab Results  Component Value Date   FRUCTOSAMINE 359 (H) 07/01/2019   FRUCTOSAMINE 277 11/09/2017   FRUCTOSAMINE 350 (H) 03/29/2017   Lab Results  Component Value Date   CHOL 96 04/06/2023   HDL 30.10 (L) 04/06/2023   LDLCALC 45 04/06/2023  LDLDIRECT 51.0 04/24/2019   TRIG 106.0 04/06/2023   CHOLHDL 3 04/06/2023   Lab Results  Component Value Date   MICRALBCREAT 11.0 10/24/2022   MICRALBCREAT 0.4 11/01/2009   Lab Results  Component Value Date   CREATININE 1.32 05/29/2023   Lab Results  Component Value Date   GFR 54.14 (L) 05/29/2023    ASSESSMENT / PLAN  1. Type 2 diabetes mellitus with hyperglycemia, with long-term current use of insulin  (HCC)     Diabetes Mellitus type 2, complicated by diabetic neuropathy/CKD//CAD - Diabetic status / severity: Controlled.  Lab Results  Component Value Date   HGBA1C 6.2 (A) 11/14/2023    - Hemoglobin A1c goal <6.5%   - Medications: No change in the pump setting today.  -Continue Mounjaro  7.5 mg weekly.  Patient/wife prefers to stay on current dose of Mounjaro .  - Home glucose testing: continue CGM /Dexcom G6 and check blood glucose as needed.  - Discussed/ Gave Hypoglycemia treatment plan.  # Consult : not required at this time.   # Annual urine for microalbuminuria/ creatinine ratio, no microalbuminuria currently, continue ACE/ARB /losartan .  Following with nephrology.   Last  Lab Results  Component Value Date   MICRALBCREAT 11.0 10/24/2022    # Foot check nightly / neuropathy, continue gabapentin  per PCP.  # Annual dilated diabetic eye exams.   - Diet: Make healthy diabetic food choices.  2. Blood pressure  -  BP Readings from Last 1 Encounters:  11/14/23 122/80    - Control is in target.  - No change in current plans.  3. Lipid status / Hyperlipidemia - Last  Lab Results  Component Value Date   LDLCALC 45 04/06/2023   - Continue rosuvastatin  20 mg daily, continue  Vascepa .  # Postablative hypothyroidism. -Patient is currently taking levothyroxine  150 mcg daily.  Continue the same.  Annual thyroid  lab.  Diagnoses and all orders for this visit:  Type 2 diabetes mellitus with hyperglycemia, with long-term current use of insulin  (HCC) -     POCT glycosylated hemoglobin (Hb A1C)   DISPOSITION Follow up in clinic in 3 months suggested.   All questions answered and patient verbalized understanding of the plan.  Darvis Croft, MD [facility] Endocrinology [facility] [address], [address] [place], [place] [address] Phone # [phone]  At least part of this note was generated using voice recognition software. Inadvertent word errors may have occurred, which were not recognized during the proofreading process.

## 2023-11-21 NOTE — Progress Notes (Signed)
 Assessment/Plan:   1.  Parkinsons Disease             -Status post bilateral DBS STN surgery             -Battery changes occurring September 05, 2012; June 10, 2015; November 13, 2017; 09/25/19; 12/15/22.               -Take carbidopa /levodopa  25/100 CR, currently taking 2 tablets up to 5 times per day.    2.  RBD             -He has tried clonazepam  several times and ultimately ends up stopping it because of side effect.  Could try Rozerem in the future if insurance would pay.  Decided to hold off for now as the patient states that while dreaming is vivid, it is not particularly bothersome and he is not falling out of the bed.   3.  OSAS, noncompliant with CPAP.             -Patient aware of risks/morbidity and mortality associated with untreated sleep apnea 4.  CHF             -ICD placed on 11/15/2013  -DBS will need placed into special mode if pt ever requires cardioversion 5.  Dysphagia  -Patient last modified barium swallow was March 30, 2021.  Patient with mild oral phase dysphagia and mild to moderate pharyngeal phase dysphagia and also the appearance of esophageal phase dysphagia.  The speech therapist recommended for his regular diet, but if he has more coughing or other dysphagia symptoms, to consider nectar thick liquids.  -It should be noted that Parkinson's disease does not cause esophageal phase dysphagia and this may need to be evaluated by gastroenterology.  It could certainly be responsible for the other phases of dysphagia.  6.  Myoclonus  - Resolved when gabapentin  discontinued 7.  Hallucinations  -were Markedly improved on Nuplazid , 34 mg daily.  Did get better when tramadol  and Cymbalta  were discontinued, but still having some issues. 8.  Paroxysmal A-fib  -Not on anticoagulation because of his history of falls. 9.  R foot dystonia  -Patient working with Dr. Carilyn and doing Botox .  This has been significantly helpful. 10.  R facial pulling  - Patient is having  significant pulling of the right face and tongue to the right.  He had difficulty speaking because of it.  He had some twitching of the right face.  I did turn off the entire device several times, and it seemed to get markedly better when the device was off.  I turned off each side independently, and was not completely convinced then that it was 1 side or the other causing the problem (1 would have thought it would have been the left brain).  Strangely, his device settings have not changed in years, so it is unclear why this would have been the case.  Nonetheless, I markedly decreased his settings in terms of amplitude today and he did seem to be better when he left.  - CT brain will be done today stat as patient does think it got worse after a fall.  He also tells me that he had a headache that felt just like it did when he had a myelogram which was worst headache I ever had.  - If above is negative, I am going to consider EEG soon.  - He will follow-up with me after the above has been completed.  Subjective:  Henry [name] was seen today in follow up for Parkinsons disease.  My previous records were reviewed prior to todays visit as well as outside records available to me.  Patient with wife who supplements history.  I weaned him off of the cymbalta  last visit and stopped the tramadol .  I told him if that didn't help with the hallucinations to call and we would consider neuroimaging.  I haven't heard from him since that time.  He reports that hallucinations of demons are gone but now he is c/o cabinets are tilting.  Continues to follow with Dr. Carilyn for dystonia - last injections 09/27/23 and notes reviewed.  Has been in PT/OT and I also reviewed those notes.  Notes that his tongue and face are pulling.  He initially told me x  1 year but wife thinks since about September.  Daughter agrees.  It is fairly constant.  The patient states that it feels like his tongue is stuck.  He did fall and hit  his head.  Current prescribed movement disorder medications: Carbidopa /levodopa  25/100 CR, 2 tablets at 8 AM/noon/4pm/8pm/midnight (this is how he is dosing this now) Carbidopa /levodopa  50/200 CR at bedtime (he's not taking that) Cymbalta , 30 mg daily Nuplazid , 34 mg daily   Prior medications: Gabapentin  (myoclonus);    ALLERGIES:   Allergies  Allergen Reactions   Penicillins Anaphylaxis and Rash    Because of a history of documented adverse serious drug reaction;Medi Alert bracelet  is recommended PATIENT HAS HAD A PCN REACTION WITH IMMEDIATE RASH, FACIAL/TONGUE/THROAT SWELLING, SOB, OR LIGHTHEADEDNESS WITH HYPOTENSION:  #  #  YES  #  #  Has patient had a PCN reaction causing severe rash involving mucus membranes or skin necrosis: unknown Has patient had a PCN reaction that required hospitalization NO Has patient had a PCN reaction occurring within the last 10 years: NO   Klonopin  [Clonazepam ] Other (See Comments)    agitation   Peanut-Containing Drug Products Cough   Watermelon [Citrullus Vulgaris] Other (See Comments) and Cough    Tickle in throat    CURRENT MEDICATIONS:  Outpatient Encounter Medications as of 11/22/2023  Medication Sig   acetaminophen  (TYLENOL ) 500 MG tablet Take 1,000 mg by mouth at bedtime as needed for mild pain or headache.    albuterol  (VENTOLIN  HFA) 108 (90 Base) MCG/ACT inhaler TAKE 2 PUFFS BY MOUTH EVERY 6 HOURS AS NEEDED FOR WHEEZE OR SHORTNESS OF BREATH   allopurinol  (ZYLOPRIM ) 100 MG tablet Take 1.5 tablets (150 mg total) by mouth daily.   aspirin  EC 81 MG tablet Take 1 tablet (81 mg total) by mouth daily. Swallow whole.   Carbidopa -Levodopa  ER (SINEMET  CR) 25-100 MG tablet controlled release TAKE 2 TABLETS BY MOUTH 5 TIMES PER DAY AS DIRECTED   Cholecalciferol (VITAMIN D) 50 MCG (2000 UT) tablet Take 2,000 Units by mouth daily.    Continuous Blood Gluc Receiver (DEXCOM G6 RECEIVER) DEVI Use to check blood sugar daily   Continuous Glucose Sensor  (DEXCOM G6 SENSOR) MISC USE TO CHECK BLOOD SUGAR DAILY   Continuous Glucose Transmitter (DEXCOM G6 TRANSMITTER) MISC Change every 3 months   dorzolamide-timolol (COSOPT) 22.3-6.8 MG/ML ophthalmic solution Place 1 drop into the right eye 2 (two) times daily.   DULoxetine  (CYMBALTA ) 20 MG capsule Take 1 capsule (20 mg total) by mouth daily.   fluticasone -salmeterol (WIXELA INHUB) 250-50 MCG/ACT AEPB INHALE 1 PUFF INTO THE LUNGS IN THE MORNING AND AT BEDTIME**RINSE AFTER USE**   FREESTYLE LITE test strip USE TO CHECK  BLOOD SUGAR 4 TIMES DAILY.   furosemide  (LASIX ) 20 MG tablet TAKE 1 (20MG ) TABLET AND 1/2TAB OF THE 80 MG TABLET ONCE DAILY TO EQUAL 60 MG DAILY   furosemide  (LASIX ) 80 MG tablet TAKE 1 TABLET BY MOUTH EVERY DAY   icosapent  Ethyl (VASCEPA ) 1 g capsule Take 2 capsules (2 g total) by mouth 2 (two) times daily.   Insulin  Disposable Pump (OMNIPOD 5 DEXG7G6 PODS GEN 5) MISC CHANGE POD EVERY 3 DAYS   Insulin  Disposable Pump (OMNIPOD 5 G6 INTRO, GEN 5,) KIT 1 kit by Does not apply route daily.   insulin  lispro (HUMALOG ) 100 UNIT/ML injection USE MAXIMUM 76 UNITS PER   DAY WITH V-GO PUMP   levothyroxine  (SYNTHROID ) 150 MCG tablet TAKE 1 TABLET BY MOUTH DAILY BEFORE BREAKFAST.   losartan  (COZAAR ) 25 MG tablet Take 1 tablet (25 mg total) by mouth daily.   metoprolol  succinate (TOPROL -XL) 50 MG 24 hr tablet TAKE 1 TABLET BY MOUTH EVERY DAY WITH OR IMMEDIATELY FOLLOWING A MEAL   mirabegron  ER (MYRBETRIQ ) 25 MG TB24 tablet Take 1 tablet (25 mg total) by mouth daily.   nitroGLYCERIN  (NITROSTAT ) 0.4 MG SL tablet Place 1 tablet (0.4 mg total) under the tongue every 5 (five) minutes as needed for chest pain.   NUPLAZID  34 MG CAPS TAKE 1 CAPSULE BY MOUTH 1 TIME A DAY   OVER THE COUNTER MEDICATION Apply 1 application topically daily as needed (pain). Theraworx Pain Cream   polyethylene glycol (MIRALAX / GLYCOLAX) packet Take 8.5 g by mouth daily.   potassium chloride  SA (KLOR-CON  M) 20 MEQ tablet TAKE 40  MEQ BY MOUTH IN THE MORNING AND TAKE 20 MEQ IN THE EVENING   rosuvastatin  (CRESTOR ) 20 MG tablet TAKE 1 TAB BY MOUTH EVERYDAY AT BEDTIME   sennosides-docusate sodium  (SENOKOT-S) 8.6-50 MG tablet Take 1 tablet by mouth daily as needed for constipation. Take along with miralax.   spironolactone  (ALDACTONE ) 25 MG tablet TAKE 1/2 TABLET BY MOUTH EVERY DAY   tirzepatide  (MOUNJARO ) 7.5 MG/0.5ML Pen Inject 7.5 mg into the skin once a week.   vitamin B-12 (CYANOCOBALAMIN ) 1000 MCG tablet Take 1,000 mcg by mouth daily.   No facility-administered encounter medications on file as of 11/22/2023.    Objective:   PHYSICAL EXAMINATION:    VITALS:   Vitals:   11/22/23 1305  BP: 124/86  Pulse: 74  SpO2: 98%  Weight: 258 lb (117 kg)     GEN:  The patient appears stated age and is in NAD. HEENT:  Normocephalic, atraumatic.  The mucous membranes are moist. The superficial temporal arteries are without ropiness or tenderness. Cardiovascular: Regular rate rhythm Lungs: Clear to auscultation bilaterally  Neurological examination:  Orientation: The patient is alert and oriented to person, place, and time.  He asks appropriate questions and is interactive.  He does tell me he feels much more clear today and yesterday that he had previously (he also has not taken tramadol  because he ran out of it) Cranial nerves: Upon walking into the room, the patient had significant right facial droop and right facial pull.  The tongue was pulling within the mouth to the right, but he was able to move it to the left on command and stick it straight out on command.  The speech is significantly dysarthric.  Upon turning off his DBS device, the facial droop and tongue pull seem to get much better and speech also seemed to get somewhat better.  We trialed this several times and each  time it was on it seemed to get worse and each time it was off it seemed to get better.  Hearing is intact to conversational tone. Sensation:  Sensation is intact to light touch throughout Motor: Strength is at least antigravity x4.  Movement examination: Tone: There is normal tone in the upper and lower extremities Abnormal movements: No significant tremor today.  As above, he had right facial twitching and some right leg twitching when I first walk into the office today. Coordination:  There is slowness with foot taps bilaterally, L>R Gait and Station: Patient was given our U step to use in the office and he was actually able to walk pretty well with that once his device was adjusted.  I have reviewed and interpreted the following labs independently    Chemistry      Component Value Date/Time   NA 139 05/29/2023 1619   NA 141 08/16/2022 1115   K 4.6 05/29/2023 1619   CL 97 05/29/2023 1619   CO2 33 (H) 05/29/2023 1619   BUN 23 05/29/2023 1619   BUN 20 08/16/2022 1115   CREATININE 1.32 05/29/2023 1619   CREATININE 1.37 (H) 07/28/2022 0826      Component Value Date/Time   CALCIUM  9.1 05/29/2023 1619   ALKPHOS 78 05/29/2023 1619   AST 5 05/29/2023 1619   ALT 2 05/29/2023 1619   BILITOT 0.6 05/29/2023 1619       Lab Results  Component Value Date   WBC 7.3 05/29/2023   HGB 14.5 05/29/2023   HCT 43.5 05/29/2023   MCV 86.8 05/29/2023   PLT 159.0 05/29/2023    Lab Results  Component Value Date   TSH 5.14 05/29/2023     Total time spent on today's visit was 55  minutes, including both face-to-face time and nonface-to-face time.  Time included that spent on review of records (prior notes available to me/labs/imaging if pertinent), discussing treatment and goals, answering patient's questions and coordinating care.  This was independent of his DBS time, on a separate note  Cc:  Cleatus Arlyss RAMAN, MD

## 2023-11-22 ENCOUNTER — Ambulatory Visit
Admission: RE | Admit: 2023-11-22 | Discharge: 2023-11-22 | Disposition: A | Source: Ambulatory Visit | Attending: Neurology | Admitting: Neurology

## 2023-11-22 ENCOUNTER — Other Ambulatory Visit: Payer: Self-pay

## 2023-11-22 ENCOUNTER — Ambulatory Visit (INDEPENDENT_AMBULATORY_CARE_PROVIDER_SITE_OTHER): Admitting: Neurology

## 2023-11-22 ENCOUNTER — Ambulatory Visit: Payer: Self-pay | Admitting: Neurology

## 2023-11-22 VITALS — BP 124/86 | HR 74 | Wt 258.0 lb

## 2023-11-22 DIAGNOSIS — R4182 Altered mental status, unspecified: Secondary | ICD-10-CM

## 2023-11-22 DIAGNOSIS — R471 Dysarthria and anarthria: Secondary | ICD-10-CM | POA: Diagnosis not present

## 2023-11-22 DIAGNOSIS — R29810 Facial weakness: Secondary | ICD-10-CM | POA: Diagnosis not present

## 2023-11-22 DIAGNOSIS — G20A2 Parkinson's disease without dyskinesia, with fluctuations: Secondary | ICD-10-CM | POA: Diagnosis not present

## 2023-11-22 DIAGNOSIS — Z9689 Presence of other specified functional implants: Secondary | ICD-10-CM | POA: Diagnosis not present

## 2023-11-22 NOTE — Procedures (Signed)
 DBS Programming was performed.    Manufacturer of DBS device: [employer]  Total time spent programming was 25 minutes.  Device was confirmed to be on.  Soft start was confirmed to be on.  Impedences were checked and were within normal limits.  Battery was checked and was not near end of life    Final settings were as follows with Group B active:   Active Contact Amplitude  PW (ms) Frequency (hz) Side Effects Battery  Left Brain        09/08/19 1-2+ 4.4V 90 170  2.59  10/08/19             Group A 1-2+ 4.72mA 90 170         Group B 0-1-2-C+ 1.1 80 125     0- (1.1)1-(1.0)2-(0.6)       01/19/20             Group B 0-1-2-C+ 1.1 80 125  4 yrs 9 months  05/27/20 Group B 0-1-2-C+ 1.3 80 125  4 years, 1 month  11/30/20 0-1-2-C+ 1.3 80 125  3 years, 5 months  05/31/21 0-1-2-C+ 1.3 80 125 Higher amp with face pull 2 yr, 10 months   12/05/21 0-1-2-C+ 1.4 80 125  2 yr, 3 months  06/06/22 0-1-2-C+ 1.4 80 125    11/13/22 0-1-2-C+ 1.4 80 125  3 months  01/15/23 0-1-2-C+ 1.4 80 125  19yrs, 3 months - 97%  07/17/23 0-1-2-C+ 1.4 80 125  3 yrs, 9 months - 83%  11/22/23 0-1-2-C+ 0.7 80 125  2 yrs, 4 months - 73%          Right Brain        09/08/19 4-7+ 4.5V 90 170    10/08/19             Group A 4-7+ 2.34mA 90 170         Group B 4-7-C+ 2.5 60 125     4-(2.5)7-(0.6)C+       01/19/20             Group B 4-7-C+ 2.8 60 165    05/27/20 group B 4-7-C+ 2.8 60 165    11/30/20 4-7-C+ 2.8 60 165    05/31/21 4-7-C+ 2.9 60 165    12/05/21 4-7-C+ 2.9 60 165    06/06/22 4-7-C+ 2.9 60 165    11/13/22 4-7-C+ 2.9 60 165    01/15/23 4-7-C+ 2.9 60 165    07/17/23 4-7-C+ 2.9 60 165    11/22/23 4-7-C+ 2.2 60 165

## 2023-11-23 ENCOUNTER — Other Ambulatory Visit: Payer: Self-pay | Admitting: Neurology

## 2023-11-23 DIAGNOSIS — G253 Myoclonus: Secondary | ICD-10-CM

## 2023-11-23 DIAGNOSIS — G20A2 Parkinson's disease without dyskinesia, with fluctuations: Secondary | ICD-10-CM

## 2023-11-23 DIAGNOSIS — G20A1 Parkinson's disease without dyskinesia, without mention of fluctuations: Secondary | ICD-10-CM

## 2023-11-27 ENCOUNTER — Other Ambulatory Visit

## 2023-11-27 ENCOUNTER — Telehealth: Payer: Self-pay | Admitting: Neurology

## 2023-11-27 ENCOUNTER — Emergency Department (HOSPITAL_COMMUNITY)

## 2023-11-27 ENCOUNTER — Observation Stay (HOSPITAL_COMMUNITY)

## 2023-11-27 ENCOUNTER — Other Ambulatory Visit: Payer: Self-pay

## 2023-11-27 ENCOUNTER — Inpatient Hospital Stay (HOSPITAL_COMMUNITY)
Admission: EM | Admit: 2023-11-27 | Discharge: 2023-12-05 | DRG: 056 | Disposition: A | Discharge status: Rehabilitation Facility | Attending: Internal Medicine | Admitting: Internal Medicine

## 2023-11-27 DIAGNOSIS — J441 Chronic obstructive pulmonary disease with (acute) exacerbation: Secondary | ICD-10-CM | POA: Diagnosis not present

## 2023-11-27 DIAGNOSIS — I110 Hypertensive heart disease with heart failure: Secondary | ICD-10-CM | POA: Diagnosis present

## 2023-11-27 DIAGNOSIS — N179 Acute kidney failure, unspecified: Secondary | ICD-10-CM | POA: Diagnosis not present

## 2023-11-27 DIAGNOSIS — E785 Hyperlipidemia, unspecified: Secondary | ICD-10-CM | POA: Diagnosis present

## 2023-11-27 DIAGNOSIS — Z7982 Long term (current) use of aspirin: Secondary | ICD-10-CM | POA: Diagnosis not present

## 2023-11-27 DIAGNOSIS — R569 Unspecified convulsions: Secondary | ICD-10-CM | POA: Diagnosis not present

## 2023-11-27 DIAGNOSIS — I5022 Chronic systolic (congestive) heart failure: Secondary | ICD-10-CM

## 2023-11-27 DIAGNOSIS — R059 Cough, unspecified: Secondary | ICD-10-CM | POA: Diagnosis not present

## 2023-11-27 DIAGNOSIS — Z9641 Presence of insulin pump (external) (internal): Secondary | ICD-10-CM | POA: Diagnosis present

## 2023-11-27 DIAGNOSIS — G909 Disorder of the autonomic nervous system, unspecified: Secondary | ICD-10-CM | POA: Diagnosis present

## 2023-11-27 DIAGNOSIS — K5900 Constipation, unspecified: Secondary | ICD-10-CM | POA: Diagnosis present

## 2023-11-27 DIAGNOSIS — E039 Hypothyroidism, unspecified: Secondary | ICD-10-CM | POA: Diagnosis present

## 2023-11-27 DIAGNOSIS — I10 Essential (primary) hypertension: Secondary | ICD-10-CM | POA: Diagnosis not present

## 2023-11-27 DIAGNOSIS — G8194 Hemiplegia, unspecified affecting left nondominant side: Secondary | ICD-10-CM | POA: Diagnosis present

## 2023-11-27 DIAGNOSIS — G4733 Obstructive sleep apnea (adult) (pediatric): Secondary | ICD-10-CM | POA: Diagnosis present

## 2023-11-27 DIAGNOSIS — H409 Unspecified glaucoma: Secondary | ICD-10-CM | POA: Diagnosis present

## 2023-11-27 DIAGNOSIS — R55 Syncope and collapse: Secondary | ICD-10-CM | POA: Diagnosis not present

## 2023-11-27 DIAGNOSIS — Z515 Encounter for palliative care: Secondary | ICD-10-CM | POA: Diagnosis not present

## 2023-11-27 DIAGNOSIS — I5020 Unspecified systolic (congestive) heart failure: Secondary | ICD-10-CM | POA: Diagnosis not present

## 2023-11-27 DIAGNOSIS — B379 Candidiasis, unspecified: Secondary | ICD-10-CM | POA: Diagnosis present

## 2023-11-27 DIAGNOSIS — Z95810 Presence of automatic (implantable) cardiac defibrillator: Secondary | ICD-10-CM | POA: Diagnosis not present

## 2023-11-27 DIAGNOSIS — G20B1 Parkinson's disease with dyskinesia, without mention of fluctuations: Secondary | ICD-10-CM | POA: Diagnosis present

## 2023-11-27 DIAGNOSIS — R5381 Other malaise: Secondary | ICD-10-CM | POA: Diagnosis present

## 2023-11-27 DIAGNOSIS — I5023 Acute on chronic systolic (congestive) heart failure: Secondary | ICD-10-CM | POA: Diagnosis present

## 2023-11-27 DIAGNOSIS — I2510 Atherosclerotic heart disease of native coronary artery without angina pectoris: Secondary | ICD-10-CM | POA: Diagnosis present

## 2023-11-27 DIAGNOSIS — I959 Hypotension, unspecified: Secondary | ICD-10-CM | POA: Diagnosis not present

## 2023-11-27 DIAGNOSIS — Z951 Presence of aortocoronary bypass graft: Secondary | ICD-10-CM

## 2023-11-27 DIAGNOSIS — Z79890 Hormone replacement therapy (postmenopausal): Secondary | ICD-10-CM

## 2023-11-27 DIAGNOSIS — R0989 Other specified symptoms and signs involving the circulatory and respiratory systems: Secondary | ICD-10-CM | POA: Diagnosis not present

## 2023-11-27 DIAGNOSIS — I130 Hypertensive heart and chronic kidney disease with heart failure and stage 1 through stage 4 chronic kidney disease, or unspecified chronic kidney disease: Secondary | ICD-10-CM | POA: Diagnosis present

## 2023-11-27 DIAGNOSIS — Z91018 Allergy to other foods: Secondary | ICD-10-CM

## 2023-11-27 DIAGNOSIS — M109 Gout, unspecified: Secondary | ICD-10-CM | POA: Diagnosis present

## 2023-11-27 DIAGNOSIS — F0280 Dementia in other diseases classified elsewhere without behavioral disturbance: Secondary | ICD-10-CM | POA: Diagnosis present

## 2023-11-27 DIAGNOSIS — G2581 Restless legs syndrome: Secondary | ICD-10-CM | POA: Diagnosis present

## 2023-11-27 DIAGNOSIS — H9193 Unspecified hearing loss, bilateral: Secondary | ICD-10-CM | POA: Diagnosis present

## 2023-11-27 DIAGNOSIS — I480 Paroxysmal atrial fibrillation: Secondary | ICD-10-CM | POA: Diagnosis present

## 2023-11-27 DIAGNOSIS — N183 Chronic kidney disease, stage 3 unspecified: Secondary | ICD-10-CM | POA: Diagnosis present

## 2023-11-27 DIAGNOSIS — Z794 Long term (current) use of insulin: Secondary | ICD-10-CM | POA: Diagnosis not present

## 2023-11-27 DIAGNOSIS — Z66 Do not resuscitate: Secondary | ICD-10-CM | POA: Diagnosis present

## 2023-11-27 DIAGNOSIS — Z9682 Presence of neurostimulator: Secondary | ICD-10-CM

## 2023-11-27 DIAGNOSIS — Z91010 Allergy to peanuts: Secondary | ICD-10-CM

## 2023-11-27 DIAGNOSIS — G20A2 Parkinson's disease without dyskinesia, with fluctuations: Secondary | ICD-10-CM | POA: Diagnosis not present

## 2023-11-27 DIAGNOSIS — Z880 Allergy status to penicillin: Secondary | ICD-10-CM

## 2023-11-27 DIAGNOSIS — Z961 Presence of intraocular lens: Secondary | ICD-10-CM | POA: Diagnosis present

## 2023-11-27 DIAGNOSIS — B370 Candidal stomatitis: Secondary | ICD-10-CM | POA: Diagnosis present

## 2023-11-27 DIAGNOSIS — Z79899 Other long term (current) drug therapy: Secondary | ICD-10-CM

## 2023-11-27 DIAGNOSIS — R29810 Facial weakness: Secondary | ICD-10-CM | POA: Diagnosis not present

## 2023-11-27 DIAGNOSIS — Z7951 Long term (current) use of inhaled steroids: Secondary | ICD-10-CM

## 2023-11-27 DIAGNOSIS — R29818 Other symptoms and signs involving the nervous system: Secondary | ICD-10-CM | POA: Diagnosis not present

## 2023-11-27 DIAGNOSIS — E1122 Type 2 diabetes mellitus with diabetic chronic kidney disease: Secondary | ICD-10-CM | POA: Diagnosis not present

## 2023-11-27 DIAGNOSIS — J45901 Unspecified asthma with (acute) exacerbation: Secondary | ICD-10-CM | POA: Diagnosis not present

## 2023-11-27 DIAGNOSIS — R404 Transient alteration of awareness: Secondary | ICD-10-CM | POA: Diagnosis not present

## 2023-11-27 DIAGNOSIS — E119 Type 2 diabetes mellitus without complications: Secondary | ICD-10-CM | POA: Diagnosis not present

## 2023-11-27 DIAGNOSIS — Z711 Person with feared health complaint in whom no diagnosis is made: Secondary | ICD-10-CM | POA: Diagnosis not present

## 2023-11-27 DIAGNOSIS — I255 Ischemic cardiomyopathy: Secondary | ICD-10-CM | POA: Diagnosis present

## 2023-11-27 DIAGNOSIS — R531 Weakness: Secondary | ICD-10-CM | POA: Diagnosis present

## 2023-11-27 DIAGNOSIS — Z7985 Long-term (current) use of injectable non-insulin antidiabetic drugs: Secondary | ICD-10-CM

## 2023-11-27 DIAGNOSIS — Z955 Presence of coronary angioplasty implant and graft: Secondary | ICD-10-CM

## 2023-11-27 DIAGNOSIS — E1140 Type 2 diabetes mellitus with diabetic neuropathy, unspecified: Secondary | ICD-10-CM | POA: Diagnosis present

## 2023-11-27 DIAGNOSIS — R918 Other nonspecific abnormal finding of lung field: Secondary | ICD-10-CM | POA: Diagnosis not present

## 2023-11-27 DIAGNOSIS — I639 Cerebral infarction, unspecified: Secondary | ICD-10-CM | POA: Diagnosis not present

## 2023-11-27 DIAGNOSIS — G20A1 Parkinson's disease without dyskinesia, without mention of fluctuations: Secondary | ICD-10-CM | POA: Diagnosis present

## 2023-11-27 DIAGNOSIS — D696 Thrombocytopenia, unspecified: Secondary | ICD-10-CM | POA: Diagnosis not present

## 2023-11-27 DIAGNOSIS — N1831 Chronic kidney disease, stage 3a: Secondary | ICD-10-CM | POA: Diagnosis not present

## 2023-11-27 DIAGNOSIS — I5042 Chronic combined systolic (congestive) and diastolic (congestive) heart failure: Secondary | ICD-10-CM | POA: Diagnosis not present

## 2023-11-27 DIAGNOSIS — Z1152 Encounter for screening for COVID-19: Secondary | ICD-10-CM | POA: Diagnosis not present

## 2023-11-27 DIAGNOSIS — R4781 Slurred speech: Secondary | ICD-10-CM | POA: Diagnosis not present

## 2023-11-27 DIAGNOSIS — M7989 Other specified soft tissue disorders: Secondary | ICD-10-CM | POA: Diagnosis not present

## 2023-11-27 DIAGNOSIS — K8020 Calculus of gallbladder without cholecystitis without obstruction: Secondary | ICD-10-CM | POA: Diagnosis not present

## 2023-11-27 DIAGNOSIS — I4891 Unspecified atrial fibrillation: Secondary | ICD-10-CM | POA: Diagnosis not present

## 2023-11-27 DIAGNOSIS — E1165 Type 2 diabetes mellitus with hyperglycemia: Secondary | ICD-10-CM | POA: Diagnosis present

## 2023-11-27 DIAGNOSIS — J90 Pleural effusion, not elsewhere classified: Secondary | ICD-10-CM | POA: Diagnosis not present

## 2023-11-27 DIAGNOSIS — Z89022 Acquired absence of left finger(s): Secondary | ICD-10-CM

## 2023-11-27 DIAGNOSIS — R0602 Shortness of breath: Secondary | ICD-10-CM | POA: Diagnosis not present

## 2023-11-27 DIAGNOSIS — I252 Old myocardial infarction: Secondary | ICD-10-CM

## 2023-11-27 DIAGNOSIS — Z9181 History of falling: Secondary | ICD-10-CM | POA: Diagnosis not present

## 2023-11-27 DIAGNOSIS — Z950 Presence of cardiac pacemaker: Secondary | ICD-10-CM | POA: Diagnosis not present

## 2023-11-27 DIAGNOSIS — J9601 Acute respiratory failure with hypoxia: Secondary | ICD-10-CM | POA: Diagnosis not present

## 2023-11-27 DIAGNOSIS — Z91199 Patient's noncompliance with other medical treatment and regimen due to unspecified reason: Secondary | ICD-10-CM

## 2023-11-27 DIAGNOSIS — R443 Hallucinations, unspecified: Secondary | ICD-10-CM | POA: Diagnosis not present

## 2023-11-27 DIAGNOSIS — Z8249 Family history of ischemic heart disease and other diseases of the circulatory system: Secondary | ICD-10-CM

## 2023-11-27 DIAGNOSIS — Z7189 Other specified counseling: Secondary | ICD-10-CM | POA: Diagnosis not present

## 2023-11-27 DIAGNOSIS — E66812 Obesity, class 2: Secondary | ICD-10-CM | POA: Diagnosis present

## 2023-11-27 DIAGNOSIS — R471 Dysarthria and anarthria: Secondary | ICD-10-CM | POA: Diagnosis not present

## 2023-11-27 DIAGNOSIS — J9811 Atelectasis: Secondary | ICD-10-CM | POA: Diagnosis present

## 2023-11-27 LAB — COMPREHENSIVE METABOLIC PANEL WITH GFR
ALT: 5 U/L (ref 0–44)
AST: 21 U/L (ref 15–41)
Albumin: 3.9 g/dL (ref 3.5–5.0)
Alkaline Phosphatase: 66 U/L (ref 38–126)
Anion gap: 15 (ref 5–15)
BUN: 28 mg/dL — ABNORMAL HIGH (ref 8–23)
CO2: 27 mmol/L (ref 22–32)
Calcium: 8.9 mg/dL (ref 8.9–10.3)
Chloride: 96 mmol/L — ABNORMAL LOW (ref 98–111)
Creatinine, Ser: 1.15 mg/dL (ref 0.61–1.24)
GFR, Estimated: >60 mL/min (ref >60)
Glucose, Bld: 96 mg/dL (ref 70–99)
Potassium: 4.7 mmol/L (ref 3.5–5.1)
Sodium: 138 mmol/L (ref 135–145)
Total Bilirubin: 1.3 mg/dL — ABNORMAL HIGH (ref 0.0–1.2)
Total Protein: 6.5 g/dL (ref 6.5–8.1)

## 2023-11-27 LAB — DIFFERENTIAL
Abs Immature Granulocytes: 0.03 K/uL (ref 0.00–0.07)
Basophils Absolute: 0.1 K/uL (ref 0.0–0.1)
Basophils Relative: 1 %
Eosinophils Absolute: 0.2 K/uL (ref 0.0–0.5)
Eosinophils Relative: 3 %
Immature Granulocytes: 1 %
Lymphocytes Relative: 29 %
Lymphs Abs: 1.9 K/uL (ref 0.7–4.0)
Monocytes Absolute: 0.5 K/uL (ref 0.1–1.0)
Monocytes Relative: 8 %
Neutro Abs: 3.9 K/uL (ref 1.7–7.7)
Neutrophils Relative %: 58 %

## 2023-11-27 LAB — CBC
HCT: 47.4 % (ref 39.0–52.0)
HCT: 45.4 % (ref 39.0–52.0)
Hemoglobin: 15.3 g/dL (ref 13.0–17.0)
Hemoglobin: 14.7 g/dL (ref 13.0–17.0)
MCH: 28.9 pg (ref 26.0–34.0)
MCH: 28.9 pg (ref 26.0–34.0)
MCHC: 32.3 g/dL (ref 30.0–36.0)
MCHC: 32.4 g/dL (ref 30.0–36.0)
MCV: 89.4 fL (ref 80.0–100.0)
MCV: 89.2 fL (ref 80.0–100.0)
Platelets: 144 K/uL — ABNORMAL LOW (ref 150–400)
Platelets: 146 K/uL — ABNORMAL LOW (ref 150–400)
RBC: 5.3 MIL/uL (ref 4.22–5.81)
RBC: 5.09 MIL/uL (ref 4.22–5.81)
RDW: 14 % (ref 11.5–15.5)
RDW: 14 % (ref 11.5–15.5)
WBC: 6.6 K/uL (ref 4.0–10.5)
WBC: 7 K/uL (ref 4.0–10.5)
nRBC: 0 % (ref 0.0–0.2)
nRBC: 0 % (ref 0.0–0.2)

## 2023-11-27 LAB — URINALYSIS, ROUTINE W REFLEX MICROSCOPIC
Bilirubin Urine: NEGATIVE
Glucose, UA: NEGATIVE mg/dL
Hgb urine dipstick: NEGATIVE
Ketones, ur: NEGATIVE mg/dL
Leukocytes,Ua: NEGATIVE
Nitrite: NEGATIVE
Protein, ur: NEGATIVE mg/dL
Specific Gravity, Urine: 1.008 (ref 1.005–1.030)
pH: 6 (ref 5.0–8.0)

## 2023-11-27 LAB — ETHANOL: Alcohol, Ethyl (B): <15 mg/dL (ref <15)

## 2023-11-27 LAB — CREATININE, SERUM
Creatinine, Ser: 1.41 mg/dL — ABNORMAL HIGH (ref 0.61–1.24)
GFR, Estimated: 53 mL/min — ABNORMAL LOW (ref >60)

## 2023-11-27 LAB — APTT: aPTT: 37 s — ABNORMAL HIGH (ref 24–36)

## 2023-11-27 LAB — CBG MONITORING, ED
Glucose-Capillary: 100 mg/dL — ABNORMAL HIGH (ref 70–99)
Glucose-Capillary: 140 mg/dL — ABNORMAL HIGH (ref 70–99)

## 2023-11-27 LAB — BRAIN NATRIURETIC PEPTIDE: B Natriuretic Peptide: 11.4 pg/mL (ref 0.0–100.0)

## 2023-11-27 LAB — RAPID URINE DRUG SCREEN, HOSP PERFORMED
Amphetamines: NOT DETECTED
Barbiturates: NOT DETECTED
Benzodiazepines: NOT DETECTED
Cocaine: NOT DETECTED
Opiates: NOT DETECTED
Tetrahydrocannabinol: NOT DETECTED

## 2023-11-27 LAB — FOLATE: Folate: >20 ng/mL (ref >5.9)

## 2023-11-27 LAB — PROTIME-INR
INR: 1 (ref 0.8–1.2)
Prothrombin Time: 13.6 s (ref 11.4–15.2)

## 2023-11-27 LAB — TSH: TSH: 5.922 u[IU]/mL — ABNORMAL HIGH (ref 0.350–4.500)

## 2023-11-27 MED ORDER — MELATONIN 3 MG PO TABS
3.0000 mg | ORAL_TABLET | Freq: Every evening | ORAL | Status: DC | PRN
  Administered 2023-11-28 – 2023-12-04 (×3): 3 mg via ORAL
  Filled 2023-11-27 (×4): qty 1

## 2023-11-27 MED ORDER — ICOSAPENT ETHYL 1 G PO CAPS
2.0000 g | ORAL_CAPSULE | Freq: Two times a day (BID) | ORAL | Status: DC
  Administered 2023-11-27 – 2023-12-05 (×16): 2 g via ORAL
  Filled 2023-11-27 (×17): qty 2

## 2023-11-27 MED ORDER — LOSARTAN POTASSIUM 50 MG PO TABS
25.0000 mg | ORAL_TABLET | Freq: Every day | ORAL | Status: DC
  Filled 2023-11-27: qty 1

## 2023-11-27 MED ORDER — CARBIDOPA-LEVODOPA ER 25-100 MG PO TBCR
2.0000 | EXTENDED_RELEASE_TABLET | Freq: Every day | ORAL | Status: DC
  Administered 2023-11-27 – 2023-12-05 (×38): 2 via ORAL
  Filled 2023-11-27 (×44): qty 2

## 2023-11-27 MED ORDER — INSULIN ASPART 100 UNIT/ML IJ SOLN
0.0000 [IU] | Freq: Three times a day (TID) | INTRAMUSCULAR | Status: DC
  Administered 2023-11-28 – 2023-12-02 (×7): 1 [IU] via SUBCUTANEOUS
  Administered 2023-12-03: 2 [IU] via SUBCUTANEOUS
  Administered 2023-12-04 – 2023-12-05 (×5): 1 [IU] via SUBCUTANEOUS
  Filled 2023-11-27: qty 1
  Filled 2023-11-27: qty 2
  Filled 2023-11-27 (×2): qty 1
  Filled 2023-11-27: qty 2
  Filled 2023-11-27: qty 1
  Filled 2023-11-27: qty 3
  Filled 2023-11-27 (×6): qty 1

## 2023-11-27 MED ORDER — DULOXETINE HCL 20 MG PO CPEP
20.0000 mg | ORAL_CAPSULE | Freq: Every day | ORAL | Status: DC
  Administered 2023-11-30 – 2023-12-05 (×5): 20 mg via ORAL
  Filled 2023-11-27 (×8): qty 1

## 2023-11-27 MED ORDER — CARBIDOPA-LEVODOPA ER 50-200 MG PO TBCR
0.5000 | EXTENDED_RELEASE_TABLET | Freq: Once | ORAL | Status: DC
  Filled 2023-11-27: qty 0.5

## 2023-11-27 MED ORDER — LEVOTHYROXINE SODIUM 75 MCG PO TABS
150.0000 ug | ORAL_TABLET | Freq: Every day | ORAL | Status: DC
  Administered 2023-11-28 – 2023-12-05 (×8): 150 ug via ORAL
  Filled 2023-11-27 (×8): qty 2

## 2023-11-27 MED ORDER — ACETAMINOPHEN 650 MG RE SUPP
650.0000 mg | Freq: Four times a day (QID) | RECTAL | Status: DC | PRN
Start: 2023-11-27 — End: 2023-12-05

## 2023-11-27 MED ORDER — POLYETHYLENE GLYCOL 3350 17 G PO PACK
8.5000 g | PACK | Freq: Every day | ORAL | Status: DC
  Administered 2023-11-28 – 2023-12-02 (×5): 8.5 g via ORAL
  Filled 2023-11-27 (×5): qty 1

## 2023-11-27 MED ORDER — ROSUVASTATIN CALCIUM 20 MG PO TABS
20.0000 mg | ORAL_TABLET | Freq: Every day | ORAL | Status: DC
  Administered 2023-11-28 – 2023-12-04 (×7): 20 mg via ORAL
  Filled 2023-11-27 (×8): qty 1

## 2023-11-27 MED ORDER — DORZOLAMIDE HCL-TIMOLOL MAL 2-0.5 % OP SOLN
1.0000 [drp] | Freq: Two times a day (BID) | OPHTHALMIC | Status: DC
  Administered 2023-11-27 – 2023-12-05 (×16): 1 [drp] via OPHTHALMIC
  Filled 2023-11-27: qty 10

## 2023-11-27 MED ORDER — METOPROLOL SUCCINATE ER 25 MG PO TB24
25.0000 mg | ORAL_TABLET | Freq: Every day | ORAL | Status: DC
  Filled 2023-11-27: qty 1

## 2023-11-27 MED ORDER — INSULIN GLARGINE-YFGN 100 UNIT/ML ~~LOC~~ SOLN
15.0000 [IU] | Freq: Every day | SUBCUTANEOUS | Status: DC
  Administered 2023-11-27 – 2023-12-04 (×8): 15 [IU] via SUBCUTANEOUS
  Filled 2023-11-27 (×10): qty 0.15

## 2023-11-27 MED ORDER — FLUTICASONE FUROATE-VILANTEROL 200-25 MCG/ACT IN AEPB
1.0000 | INHALATION_SPRAY | Freq: Every day | RESPIRATORY_TRACT | Status: DC
  Administered 2023-11-28 – 2023-12-05 (×6): 1 via RESPIRATORY_TRACT
  Filled 2023-11-27: qty 28

## 2023-11-27 MED ORDER — ALBUTEROL SULFATE HFA 108 (90 BASE) MCG/ACT IN AERS: 1.0000 | INHALATION_SPRAY | RESPIRATORY_TRACT | Status: DC | PRN

## 2023-11-27 MED ORDER — LEVOTHYROXINE SODIUM 75 MCG PO TABS: 150.0000 ug | ORAL_TABLET | Freq: Every day | ORAL | Status: DC

## 2023-11-27 MED ORDER — MIRABEGRON ER 25 MG PO TB24
25.0000 mg | ORAL_TABLET | Freq: Every day | ORAL | Status: DC
  Administered 2023-11-28 – 2023-12-05 (×8): 25 mg via ORAL
  Filled 2023-11-27 (×9): qty 1

## 2023-11-27 MED ORDER — INSULIN ASPART 100 UNIT/ML IJ SOLN
0.0000 [IU] | Freq: Every day | INTRAMUSCULAR | Status: DC
  Filled 2023-11-27: qty 2
  Filled 2023-11-27: qty 1

## 2023-11-27 MED ORDER — ASPIRIN 81 MG PO TBEC
81.0000 mg | DELAYED_RELEASE_TABLET | Freq: Every day | ORAL | Status: DC
  Administered 2023-11-27 – 2023-12-05 (×9): 81 mg via ORAL
  Filled 2023-11-27 (×9): qty 1

## 2023-11-27 MED ORDER — ACETAMINOPHEN 325 MG PO TABS
650.0000 mg | ORAL_TABLET | Freq: Four times a day (QID) | ORAL | Status: DC | PRN
  Administered 2023-11-30 – 2023-12-02 (×2): 650 mg via ORAL
  Filled 2023-11-27 (×2): qty 2

## 2023-11-27 MED ORDER — ACETAMINOPHEN 500 MG PO TABS: 1000.0000 mg | ORAL_TABLET | Freq: Every evening | ORAL | Status: DC | PRN

## 2023-11-27 MED ORDER — ENOXAPARIN SODIUM 60 MG/0.6ML IJ SOSY
50.0000 mg | PREFILLED_SYRINGE | INTRAMUSCULAR | Status: DC
  Administered 2023-11-27 – 2023-12-04 (×8): 50 mg via SUBCUTANEOUS
  Filled 2023-11-27 (×8): qty 0.6

## 2023-11-27 MED ORDER — PIMAVANSERIN TARTRATE 34 MG PO CAPS
34.0000 mg | ORAL_CAPSULE | Freq: Every day | ORAL | Status: DC
  Administered 2023-11-30 – 2023-12-05 (×6): 34 mg via ORAL
  Filled 2023-11-27 (×11): qty 1

## 2023-11-27 MED ORDER — ALLOPURINOL 100 MG PO TABS
150.0000 mg | ORAL_TABLET | Freq: Every day | ORAL | Status: DC
  Administered 2023-11-28 – 2023-12-05 (×8): 150 mg via ORAL
  Filled 2023-11-27 (×8): qty 2

## 2023-11-27 MED ORDER — ALBUTEROL SULFATE (2.5 MG/3ML) 0.083% IN NEBU
2.5000 mg | INHALATION_SOLUTION | RESPIRATORY_TRACT | Status: DC | PRN
  Administered 2023-12-01 – 2023-12-05 (×6): 2.5 mg via RESPIRATORY_TRACT
  Filled 2023-11-27 (×6): qty 3

## 2023-11-27 MED ORDER — FUROSEMIDE 20 MG PO TABS
60.0000 mg | ORAL_TABLET | Freq: Every day | ORAL | Status: DC
  Filled 2023-11-27: qty 3

## 2023-11-27 MED ORDER — VITAMIN B-12 1000 MCG PO TABS
1000.0000 ug | ORAL_TABLET | Freq: Every day | ORAL | Status: DC
  Administered 2023-11-28 – 2023-12-05 (×8): 1000 ug via ORAL
  Filled 2023-11-27 (×8): qty 1

## 2023-11-27 MED ORDER — CARBIDOPA-LEVODOPA ER 50-200 MG PO TBCR
1.0000 | EXTENDED_RELEASE_TABLET | Freq: Two times a day (BID) | ORAL | Status: DC
  Administered 2023-11-27: 1 via ORAL
  Filled 2023-11-27 (×2): qty 1

## 2023-11-27 MED ORDER — SPIRONOLACTONE 12.5 MG HALF TABLET
12.5000 mg | ORAL_TABLET | Freq: Every day | ORAL | Status: DC
  Filled 2023-11-27: qty 1

## 2023-11-27 MED ORDER — VITAMIN D 25 MCG (1000 UNIT) PO TABS
2000.0000 [IU] | ORAL_TABLET | Freq: Every day | ORAL | Status: DC
  Administered 2023-11-28 – 2023-12-05 (×8): 2000 [IU] via ORAL
  Filled 2023-11-27 (×8): qty 2

## 2023-11-27 MED ORDER — ALBUMIN HUMAN 25 % IV SOLN
25.0000 g | Freq: Once | INTRAVENOUS | Status: AC
  Administered 2023-11-28: 25 g via INTRAVENOUS
  Filled 2023-11-27: qty 100

## 2023-11-27 MED ORDER — INSULIN ASPART 100 UNIT/ML IJ SOLN
4.0000 [IU] | Freq: Three times a day (TID) | INTRAMUSCULAR | Status: DC
  Administered 2023-11-28 – 2023-12-02 (×13): 4 [IU] via SUBCUTANEOUS
  Administered 2023-12-03: 2 [IU] via SUBCUTANEOUS
  Administered 2023-12-03 – 2023-12-05 (×7): 4 [IU] via SUBCUTANEOUS
  Filled 2023-11-27 (×20): qty 4

## 2023-11-27 NOTE — H&P (Signed)
 History and Physical    Henry [name] FMW:[id number] DOB: 1950-09-09 DOA: 11/27/2023  PCP: Cleatus Arlyss RAMAN, MD  Patient coming from: home  I have personally briefly reviewed patient's old medical records in [facility]  Chief Complaint: weakness  HPI: Henry [name] is Henry [name] 73 y.o. male with medical history significant of parkinson's disease s/p DBS, HFrEF, HTN, T2DM and multiple other medical issues here with generalized weakness.  Henry [name] notes his symptoms started 2-3 weeks ago.  He has Henry [name] longstanding history of parkinson's.  Got Tion Tse DBS placed in early 2000's.  Typically able to walk with Henry [name] walker, feed himself.  He's noticed more freezing episodes in the past 2-3 weeks.  More mobility issues.  He saw his neurologist on 11/13 and at that time was noted to have right facial pulling.  His DBS was turned down as Henry [name] result of this.  He's continued to have movement issues.  He feels locked up at times.  Feels his legs and toes aren't moving that much.  This morning he was walking with walker as recently as around 7 am, but when his daughter was trying to get him out of the lift chair, it was like he was frozen.  She couldn't get him out of the chair.  He couldn't lift his legs out of the ottoman.  Unable to stand.  Denies numbness, tinging.  Notes mild cough.  No fevers, chills, chest pain, SOB.  No urinary symptoms.  No smoking, no etoh.  ED Course: labs, imaging.  Admit to hospitalist for generalized weakness in setting of parkinson's.  Plan for EEG.  Case was discussed with neurology who recommended admission and EEG/MRI and formal consult if concerns.  Review of Systems: As per HPI otherwise all other systems reviewed and are negative.  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)   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   Dysrhythmia    Jeriko Kowalke-fib   GERD (gastroesophageal reflux disease)    Glaucoma    right eye   History of kidney stones    multiple   HLD (hyperlipidemia)    HTN (hypertension)    pt denies 08/19/12   Hyperplasia, prostate    Hypothyroidism    MI (myocardial infarction) (HCC)    Dr Pietro 2000, x3vessels bypass   Neuromuscular disorder (HCC)    Parkinson's Disease   OSA (obstructive sleep apnea)    AHI-28,on CPAP, noncompliant with CPAP   Parkinson disease (HCC)    1999   PONV (postoperative nausea and vomiting)    Restless legs    Shortness of breath    Hx: of at all times   UTI (lower urinary tract infection) 09/15/2012   Klebsiella   Ventral hernia    Walker as ambulation aid    also uses wheelchair at home/when going out    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   BIOPSY  10/14/2018  Procedure: BIOPSY;  Surgeon: Albertus Gordy HERO, MD;  Location: [facility];  Service: Gastroenterology;;   CATARACT EXTRACTION W/ INTRAOCULAR LENS IMPLANT     Hx: of right eye   CATARACT EXTRACTION W/ INTRAOCULAR LENS IMPLANT Left 2018   COLONOSCOPY N/Dabney Dever 10/13/2014   Procedure: COLONOSCOPY;  Surgeon: Gordy HERO Albertus, MD;  Location: [facility];  Service: Gastroenterology;  Laterality: N/Sagar Tengan;   COLONOSCOPY W/ BIOPSIES AND POLYPECTOMY     Hx: of   COLONOSCOPY WITH PROPOFOL  N/Markian Glockner 10/14/2018   Procedure: COLONOSCOPY WITH PROPOFOL ;  Surgeon: Albertus Gordy HERO, MD;  Location: [facility];  Service: Gastroenterology;  Laterality: N/Emmry Hinsch;   CORONARY ARTERY BYPASS GRAFT  2000   Sudie Laine, MD   CORONARY STENT PLACEMENT  1998   DEEP BRAIN STIMULATOR PLACEMENT  2004   Right and left VIN stimulator placement (parkinsons)   EYE SURGERY      Cataract removed in 1980s related to wood in eye when 73 years old   FINGER AMPUTATION     left pointer   IMPLANTABLE CARDIOVERTER DEFIBRILLATOR IMPLANT N/Sharena Dibenedetto 11/13/2013   Procedure: IMPLANTABLE CARDIOVERTER DEFIBRILLATOR IMPLANT;  Surgeon: Danelle LELON Birmingham, MD;  Location: [facility] CATH LAB;  Service: Cardiovascular;  Laterality: N/Jiraiya Mcewan;   INSERT / REPLACE / REMOVE PACEMAKER     medtronic   LEFT AND RIGHT HEART CATHETERIZATION WITH CORONARY ANGIOGRAM N/Chett Taniguchi 09/24/2013   Procedure: LEFT AND RIGHT HEART CATHETERIZATION WITH CORONARY ANGIOGRAM;  Surgeon: Lonni JONETTA Cash, MD;  Location: [facility] CATH LAB;  Service: Cardiovascular;  Laterality: N/Christipher Rieger;   LITHOTRIPSY     3 different times   POLYPECTOMY  10/14/2018   Procedure: POLYPECTOMY;  Surgeon: Albertus Gordy HERO, MD;  Location: [facility];  Service: Gastroenterology;;   PULSE GENERATOR IMPLANT Right 11/13/2017   Procedure: Right chest implantable pulse generator change;  Surgeon: Unice Pac, MD;  Location: [facility] OR;  Service: Neurosurgery;  Laterality: Right;  Right chest implantable pulse generator change   SUBTHALAMIC STIMULATOR BATTERY REPLACEMENT N/Darcel Frane 09/05/2012   Procedure: Deep brain stimulator battery change;  Surgeon: Pac Unice, MD;  Location: [facility];  Service: Neurosurgery;  Laterality: N/Meira Wahba;  Deep brain stimulator battery change   SUBTHALAMIC STIMULATOR BATTERY REPLACEMENT N/Solene Hereford 06/10/2015   Procedure: Deep Brain stimulator battery change;  Surgeon: Pac Unice, MD;  Location: [facility];  Service: Neurosurgery;  Laterality: N/Nevan Creighton;   SUBTHALAMIC STIMULATOR BATTERY REPLACEMENT Right 09/25/2019   Procedure: Deep brain stimulator battery change;  Surgeon: Unice Pac, MD;  Location: [facility] OR;  Service: Neurosurgery;  Laterality: Right;   SUBTHALAMIC STIMULATOR BATTERY REPLACEMENT Right 12/15/2022   Procedure: IMPLANTABLE PULSE GENERATOR  BATTERY REPLACEMENT;  Surgeon: Carollee Lani BROCKS, DO;  Location: [facility] OR;  Service: Neurosurgery;  Laterality: Right;  3C    TONSILLECTOMY      Social History  reports that he has never smoked. He has never been exposed to tobacco smoke. He has never used smokeless tobacco. He reports that he does not currently use alcohol. He reports that he does not use drugs.  Allergies  Allergen Reactions   Penicillins Anaphylaxis and Rash    Because of Kamilo Och history of documented adverse serious drug reaction;Medi Alert bracelet  is recommended PATIENT HAS HAD Kham Zuckerman PCN REACTION WITH IMMEDIATE RASH, FACIAL/TONGUE/THROAT SWELLING, SOB, OR LIGHTHEADEDNESS WITH HYPOTENSION:  #  #  YES  #  #  Has patient had Lavonne Kinderman PCN reaction causing severe rash involving mucus membranes or skin necrosis: unknown Has patient had Iyonnah Ferrante PCN reaction that required hospitalization NO Has  patient had Jaxsin Bottomley PCN reaction occurring within the last 10 years: NO   Klonopin  [Clonazepam ] Other (See Comments)    agitation   Peanut-Containing Drug Products Cough   Watermelon [Citrullus Vulgaris] Other (See Comments) and Cough    Tickle in throat    Family History  Problem Relation Age of Onset   Aneurysm Mother    Alcoholism Father    HIV/AIDS Brother 24       AIDS   Healthy Sister    Healthy Child    Peripheral vascular disease Other    Arthritis Other    Healthy Sister    Healthy Child    Healthy Child    Diabetes Neg Hx    Heart disease Neg Hx    Colon cancer Neg Hx    Prostate cancer Neg Hx     Prior to Admission medications   Medication Sig Start Date End Date Taking? Authorizing Provider  acetaminophen  (TYLENOL ) 500 MG tablet Take 1,000 mg by mouth at bedtime as needed for mild pain or headache.     [provider]  albuterol  (VENTOLIN  HFA) 108 (90 Base) MCG/ACT inhaler TAKE 2 PUFFS BY MOUTH EVERY 6 HOURS AS NEEDED FOR WHEEZE OR SHORTNESS OF BREATH 12/24/22   Cleatus Arlyss RAMAN, MD  allopurinol  (ZYLOPRIM ) 100 MG tablet Take 1.5 tablets (150 mg total) by mouth daily. 05/15/23   Cleatus Arlyss RAMAN, MD  aspirin  EC 81 MG tablet Take 1 tablet (81 mg  total) by mouth daily. Swallow whole. 12/22/22   Dawley, Troy C, DO  Carbidopa -Levodopa  ER (SINEMET  CR) 25-100 MG tablet controlled release TAKE 2 TABLETS BY MOUTH 5 TIMES PER DAY AS DIRECTED 11/23/23   Tat, Asberry RAMAN, DO  Cholecalciferol (VITAMIN D) 50 MCG (2000 UT) tablet Take 2,000 Units by mouth daily.     [provider]  Continuous Blood Gluc Receiver (DEXCOM G6 RECEIVER) DEVI Use to check blood sugar daily 02/24/21   Von Pacific, MD  Continuous Glucose Sensor (DEXCOM G6 SENSOR) MISC USE TO CHECK BLOOD SUGAR DAILY 01/30/23   Thapa, Sudan, MD  Continuous Glucose Transmitter (DEXCOM G6 TRANSMITTER) MISC Change every 3 months 01/30/23   Thapa, Sudan, MD  dorzolamide-timolol (COSOPT) 22.3-6.8 MG/ML ophthalmic solution Place 1 drop into the right eye 2 (two) times daily. 08/29/19   [provider]  DULoxetine  (CYMBALTA ) 20 MG capsule Take 1 capsule (20 mg total) by mouth daily. 07/17/23   Tat, Asberry RAMAN, DO  fluticasone -salmeterol (WIXELA INHUB) 250-50 MCG/ACT AEPB INHALE 1 PUFF INTO THE LUNGS IN THE MORNING AND AT BEDTIME**RINSE AFTER USE** 12/24/22   Cleatus Arlyss RAMAN, MD  FREESTYLE LITE test strip USE TO CHECK BLOOD SUGAR 4 TIMES DAILY. 04/13/20   Von Pacific, MD  furosemide  (LASIX ) 20 MG tablet TAKE 1 (20MG ) TABLET AND 1/2TAB OF THE 80 MG TABLET ONCE DAILY TO EQUAL 60 MG DAILY 11/12/23   Pietro Redell RAMAN, MD  furosemide  (LASIX ) 80 MG tablet TAKE 1 TABLET BY MOUTH EVERY DAY 06/08/23   Pietro Redell RAMAN, MD  icosapent  Ethyl (VASCEPA ) 1 g capsule Take 2 capsules (2 g total) by mouth 2 (two) times daily. 01/30/23   Thapa, Sudan, MD  Insulin  Disposable Pump (OMNIPOD 5 DEXG7G6 PODS GEN 5) MISC CHANGE POD EVERY 3 DAYS 08/27/23   Thapa, Sudan, MD  Insulin  Disposable Pump (OMNIPOD 5 G6 INTRO, GEN 5,) KIT 1 kit by Does not apply route daily. 02/24/21   Von Pacific, MD  insulin  lispro (HUMALOG ) 100 UNIT/ML injection USE MAXIMUM  76 UNITS PER   DAY WITH V-GO PUMP 01/30/23   Thapa, Sudan, MD   levothyroxine  (SYNTHROID ) 150 MCG tablet TAKE 1 TABLET BY MOUTH DAILY BEFORE BREAKFAST. 09/03/23   Thapa, Sudan, MD  losartan  (COZAAR ) 25 MG tablet Take 1 tablet (25 mg total) by mouth daily. 01/30/23   Thapa, Sudan, MD  metoprolol  succinate (TOPROL -XL) 50 MG 24 hr tablet TAKE 1 TABLET BY MOUTH EVERY DAY WITH OR IMMEDIATELY FOLLOWING Astin Sayre MEAL 04/10/23   Cleatus Arlyss RAMAN, MD  mirabegron  ER (MYRBETRIQ ) 25 MG TB24 tablet Take 1 tablet (25 mg total) by mouth daily. 08/22/23   Cleatus Arlyss RAMAN, MD  nitroGLYCERIN  (NITROSTAT ) 0.4 MG SL tablet Place 1 tablet (0.4 mg total) under the tongue every 5 (five) minutes as needed for chest pain. 07/18/22 11/22/23  Pietro Redell RAMAN, MD  NUPLAZID  34 MG CAPS TAKE 1 CAPSULE BY MOUTH 1 TIME Goldie Tregoning DAY 09/17/23   Tat, Asberry RAMAN, DO  OVER THE COUNTER MEDICATION Apply 1 application topically daily as needed (pain). Theraworx Pain Cream    [provider]  polyethylene glycol (MIRALAX / GLYCOLAX) packet Take 8.5 g by mouth daily.    [provider]  potassium chloride  SA (KLOR-CON  M) 20 MEQ tablet TAKE 40 MEQ BY MOUTH IN THE MORNING AND TAKE 20 MEQ IN THE EVENING 05/16/23   Cleatus Arlyss RAMAN, MD  rosuvastatin  (CRESTOR ) 20 MG tablet TAKE 1 TAB BY MOUTH EVERYDAY AT BEDTIME 01/30/23   Thapa, Sudan, MD  sennosides-docusate sodium  (SENOKOT-S) 8.6-50 MG tablet Take 1 tablet by mouth daily as needed for constipation. Take along with miralax.    [provider]  spironolactone  (ALDACTONE ) 25 MG tablet TAKE 1/2 TABLET BY MOUTH EVERY DAY 05/15/23   Pietro Redell RAMAN, MD  tirzepatide  (MOUNJARO ) 7.5 MG/0.5ML Pen Inject 7.5 mg into the skin once Scotty Pinder week. 01/30/23   Thapa, Sudan, MD  vitamin B-12 (CYANOCOBALAMIN ) 1000 MCG tablet Take 1,000 mcg by mouth daily.    [provider]    Physical Exam: Vitals:   11/27/23 1421 11/27/23 1432 11/27/23 1536  BP:  117/64   Pulse:  66   Resp:  12   Temp: 97.6 F (36.4 C) 97.6 F (36.4 C)   TempSrc: Oral Oral   SpO2:  98% 95%     Constitutional: chronically ill appearing gentleman with masked facies, resting L sided tremor Vitals:   11/27/23 1421 11/27/23 1432 11/27/23 1536  BP:  117/64   Pulse:  66   Resp:  12   Temp: 97.6 F (36.4 C) 97.6 F (36.4 C)   TempSrc: Oral Oral   SpO2:  98% 95%   Eyes: PERRL, EOMI ENMT: Mucous membranes are moist.  Neck: normal, supple Respiratory: clear to auscultation bilaterally Cardiovascular: Regular rate and rhythm, no murmurs / rubs / gallops. Mild RLE edema which is more notable than on the L. Abdomen: no tenderness, no masses palpated.  Musculoskeletal: no clubbing / cyanosis. No joint deformity upper and lower extremities.  Skin: no rashes, lesions, ulcers. No induration Neurologic: CN 2-12 intact.  Generalized weakness, ~4/5 strength throughout.  Able to lift bilateral LE's off bed. Psychiatric: Normal judgment and insight. Alert and oriented x 3. Normal mood.   Labs on Admission: I have personally reviewed following labs and imaging studies  CBC: Recent Labs  Lab 11/27/23 1545  WBC 6.6  NEUTROABS 3.9  HGB 15.3  HCT 47.4  MCV 89.4  PLT 144*    Basic Metabolic Panel: Recent Labs  Lab 11/27/23 1545  NA 138  K 4.7  CL 96*  CO2 27  GLUCOSE 96  BUN 28*  CREATININE 1.15  CALCIUM  8.9    GFR: Estimated Creatinine Clearance: 73.3 mL/min (by C-G formula based on SCr of 1.15 mg/dL).  Liver Function Tests: Recent Labs  Lab 11/27/23 1545  AST 21  ALT 5  ALKPHOS 66  BILITOT 1.3*  PROT 6.5  ALBUMIN 3.9    Urine analysis:    Component Value Date/Time   COLORURINE YELLOW 09/20/2021 1431   APPEARANCEUR CLEAR 09/20/2021 1431   LABSPEC 1.010 09/20/2021 1431   PHURINE 5.5 09/20/2021 1431   GLUCOSEU NEGATIVE 09/20/2021 1431   HGBUR SMALL (Dahiana Kulak) 09/20/2021 1431   HGBUR negative 08/18/2008 1128   BILIRUBINUR Negative 08/03/2023 1522   KETONESUR NEGATIVE 09/20/2021 1431   PROTEINUR Negative 08/03/2023 1522   PROTEINUR NEGATIVE 10/01/2017 1207    UROBILINOGEN negative (Rosha Cocker) 08/03/2023 1522   UROBILINOGEN 0.2 09/20/2021 1431   NITRITE Negative 08/03/2023 1522   NITRITE NEGATIVE 09/20/2021 1431   LEUKOCYTESUR Negative 08/03/2023 1522   LEUKOCYTESUR NEGATIVE 09/20/2021 1431    Radiological Exams on Admission: CT HEAD WO CONTRAST Result Date: 11/27/2023 EXAM: CT HEAD WITHOUT CONTRAST 11/27/2023 02:56:29 PM TECHNIQUE: CT of the head was performed without the administration of intravenous contrast. Automated exposure control, iterative reconstruction, and/or weight based adjustment of the mA/kV was utilized to reduce the radiation dose to as low as reasonably achievable. COMPARISON: 11/22/2023 CLINICAL HISTORY: Neuro deficit, acute, stroke suspected. FINDINGS: BRAIN AND VENTRICLES: Bilateral deep brain stimulator leads in place with streak artifact mildly limiting brain assessment. Mild cerebral atrophy. No acute hemorrhage. No evidence of acute infarct. No hydrocephalus. No extra-axial collection. No mass effect or midline shift. Mild carotid siphon calcifications. ORBITS: Bilateral lens replacement. SINUSES: Polypoid mucosal thickening left maxillary sinus. SOFT TISSUES AND SKULL: Right mastoidectomy. No acute soft tissue abnormality. No skull fracture. IMPRESSION: 1. No acute intracranial abnormality. Electronically signed by: Dasie Hamburg MD 11/27/2023 03:10 PM EST RP Workstation: [id number]    EKG: Independently reviewed. Sinus, poor quality  Assessment/Plan Principal Problem:   Weakness Active Problems:   Parkinson disease (HCC)    Assessment and Plan:  Generalized Weakness in the setting of Parkinson's Disease At baseline, walks with Durwood Dittus walker - 2-3 weeks ago, having worsened freezing spells, progressive weakness.  Was able to feed himself before 2-3 weeks ago, recently unable.  Today, unable to get out of lift chair which led to this hospitalization 11/13 seen by Dr. Evonnie, had R facial Pulling at that time which improved when DBS  turned off - device amplitude was turned down as Anaija Wissink result. On sinemet  2 tablets 5x/day, occasionally misses 5th dose Follow EEG Head CT without acute intracranial abnormality Neurology had also recommended an MRI, but his device is not MRI compatible based on notes from Dr. Evonnie Follow urinalysis, CXR B12, folate, TSH, ammonia No recent medicine changes from our discussion - continue nuplazid , sinimet.   Sounds like biggest recent change was his DBS being turned down ED discussed case with neurology who recommended EEG and MRI and if abnormalities, consult.  Consider discussion/formal consultation with neurology after EEG complete depending on results and symptoms.    Right Lower Extremity Swelling Follow venous ultrasound   T2DM On insulin  pump at home Transition to basal/bolus and SSI while here - conservative dosing - adjust as needed Will ask for assistance from diabetes coordinators  HFrEF Doesn't appear grossly overloaded at this time Follow BNP.  Continue lasix , spironolactone , losartan , metoprolol  Strict I/O, daily weights  HTN Lasix , spironolactone , losartan , metop  Dyslipidemia Crestor   Asthma Continue breathing treatments  Glaucoma Cosopt  Gout allopurinol     DVT prophylaxis: lovenox  Code Status:   DNR  Family Communication:  Wife, daughter at bedside  Disposition Plan:   Patient is from:  home  Anticipated DC to:  pending  Anticipated DC date:  Pending improvement  Anticipated DC barriers: Pending therapy eval, recs  Consults called:  case discussed with neurology by ED, no formal consult placed  Admission status:  obs   Severity of Illness: The appropriate patient status for this patient is OBSERVATION. Observation status is judged to be reasonable and necessary in order to provide the required intensity of service to ensure the patient's safety. The patient's presenting symptoms, physical exam findings, and initial radiographic and laboratory data  in the context of their medical condition is felt to place them at decreased risk for further clinical deterioration. Furthermore, it is anticipated that the patient will be medically stable for discharge from the hospital within 2 midnights of admission.     Meliton Monte MD [facility]  How to contact the [facility] Attending or Consulting provider 7A - 7P or covering provider during after hours 7P -7A, for this patient?   Check the care team in [facility] and look for Nesta Kimple) attending/consulting [facility] provider listed and b) the [facility] team listed Log into [email or web address] and use Faith's universal password to access. If you do not have the password, please contact the hospital operator. Locate the [facility] provider you are looking for under [facility] and page to Aryssa Rosamond number that you can be directly reached. If you still have difficulty reaching the provider, please page the [facility] (Director on Call) for the Hospitalists listed on amion for assistance.  11/27/2023, 6:45 PM

## 2023-11-27 NOTE — ED Triage Notes (Signed)
 Pt BIB [facility] from home with weakness and edema in both ankle. Patient was at home try to get up, and was unable to move legs and family notice slur speech. Patient normally walk with a walker. He has a pacemaker, and a deep brain stimulator. Ems perform a stroke exam and notice left facial droop.Patient had an appoint with the neurology today and was told to go to the hospital. Palpable systolic blood pressure 140. Vital signs: 138/90, pulse 72, RR 16, 95% RA and CBG 109.

## 2023-11-27 NOTE — Telephone Encounter (Signed)
 Pt daughter called to see how pt was doing since he was dong since stimulator was turned down, Pt can't uses his arm and leg to move his self , he is voiding all over his self they are having to use depends it is taken 3 people to change him. They said his body is pushing against them. They said that the neighbor had to come over to help them. Dr Tat was informed she advised that Pt needed to go to the ER pt family was informed they asked what ER they needed to go to, I told them because they can't move him call 911 and they will transport to the closes ER. Pt was scheduled for an EEG today at 2:30 because of change in health he will not able to make it,

## 2023-11-27 NOTE — ED Provider Notes (Signed)
 Case discussed with Dr. Matthews.  She recommends MRI and EEG if any abnormalities to have hospitalist consult if needed.  Case discussed with Dr. Perri regarding admission   Randol Simmonds, MD 11/28/23 1235

## 2023-11-27 NOTE — Telephone Encounter (Signed)
 Call patient and find out how doing since we turned stimulator down.  If same, order EEG.

## 2023-11-27 NOTE — ED Provider Notes (Signed)
 [place] EMERGENCY DEPARTMENT AT [facility] Provider Note   CSN: [id number] Arrival date & time: 11/27/23  1403     Patient presents with: Weakness   Henry [name] is a 73 y.o. male.   73 yo M with a chief complaint of fatigue.  Patient normally walks but was unable to get up and stand about 20 minutes ago.  Recently had his deep brain stimulator decreased and had plans to get an EEG performed today.  As he was unable to stand he was sent here via EMS.  Otherwise had been doing relatively well per family.  May be favoring his right side yesterday evening.  Denies trauma.  He did miss his dose of Sinemet  this morning.   Weakness      Prior to Admission medications   Medication Sig Start Date End Date Taking? Authorizing Provider  acetaminophen  (TYLENOL ) 500 MG tablet Take 1,000 mg by mouth at bedtime as needed for mild pain or headache.     [provider]  albuterol  (VENTOLIN  HFA) 108 (90 Base) MCG/ACT inhaler TAKE 2 PUFFS BY MOUTH EVERY 6 HOURS AS NEEDED FOR WHEEZE OR SHORTNESS OF BREATH 12/24/22   Cleatus Arlyss RAMAN, MD  allopurinol  (ZYLOPRIM ) 100 MG tablet Take 1.5 tablets (150 mg total) by mouth daily. 05/15/23   Cleatus Arlyss RAMAN, MD  aspirin  EC 81 MG tablet Take 1 tablet (81 mg total) by mouth daily. Swallow whole. 12/22/22   Dawley, Troy C, DO  Carbidopa -Levodopa  ER (SINEMET  CR) 25-100 MG tablet controlled release TAKE 2 TABLETS BY MOUTH 5 TIMES PER DAY AS DIRECTED 11/23/23   Tat, Asberry RAMAN, DO  Cholecalciferol (VITAMIN D) 50 MCG (2000 UT) tablet Take 2,000 Units by mouth daily.     [provider]  Continuous Blood Gluc Receiver (DEXCOM G6 RECEIVER) DEVI Use to check blood sugar daily 02/24/21   Von Pacific, MD  Continuous Glucose Sensor (DEXCOM G6 SENSOR) MISC USE TO CHECK BLOOD SUGAR DAILY 01/30/23   Thapa, Sudan, MD  Continuous Glucose Transmitter (DEXCOM G6 TRANSMITTER) MISC Change every 3 months 01/30/23   Thapa, Sudan, MD   dorzolamide-timolol (COSOPT) 22.3-6.8 MG/ML ophthalmic solution Place 1 drop into the right eye 2 (two) times daily. 08/29/19   [provider]  DULoxetine  (CYMBALTA ) 20 MG capsule Take 1 capsule (20 mg total) by mouth daily. 07/17/23   Tat, Asberry RAMAN, DO  fluticasone -salmeterol (WIXELA INHUB) 250-50 MCG/ACT AEPB INHALE 1 PUFF INTO THE LUNGS IN THE MORNING AND AT BEDTIME**RINSE AFTER USE** 12/24/22   Cleatus Arlyss RAMAN, MD  FREESTYLE LITE test strip USE TO CHECK BLOOD SUGAR 4 TIMES DAILY. 04/13/20   Von [place], MD  furosemide  (LASIX ) 20 MG tablet TAKE 1 (20MG ) TABLET AND 1/2TAB OF THE 80 MG TABLET ONCE DAILY TO EQUAL 60 MG DAILY 11/12/23   Pietro Redell RAMAN, MD  furosemide  (LASIX ) 80 MG tablet TAKE 1 TABLET BY MOUTH EVERY DAY 06/08/23   Pietro Redell RAMAN, MD  icosapent  Ethyl (VASCEPA ) 1 g capsule Take 2 capsules (2 g total) by mouth 2 (two) times daily. 01/30/23   Thapa, Sudan, MD  Insulin  Disposable Pump (OMNIPOD 5 DEXG7G6 PODS GEN 5) MISC CHANGE POD EVERY 3 DAYS 08/27/23   Thapa, Sudan, MD  Insulin  Disposable Pump (OMNIPOD 5 G6 INTRO, GEN 5,) KIT 1 kit by Does not apply route daily. 02/24/21   Von [place], MD  insulin  lispro (HUMALOG ) 100 UNIT/ML injection USE MAXIMUM 76 UNITS PER   DAY WITH V-GO PUMP 01/30/23  Thapa, Sudan, MD  levothyroxine  (SYNTHROID ) 150 MCG tablet TAKE 1 TABLET BY MOUTH DAILY BEFORE BREAKFAST. 09/03/23   Thapa, Sudan, MD  losartan  (COZAAR ) 25 MG tablet Take 1 tablet (25 mg total) by mouth daily. 01/30/23   Thapa, Sudan, MD  metoprolol  succinate (TOPROL -XL) 50 MG 24 hr tablet TAKE 1 TABLET BY MOUTH EVERY DAY WITH OR IMMEDIATELY FOLLOWING A MEAL 04/10/23   Cleatus Arlyss RAMAN, MD  mirabegron  ER (MYRBETRIQ ) 25 MG TB24 tablet Take 1 tablet (25 mg total) by mouth daily. 08/22/23   Cleatus Arlyss RAMAN, MD  nitroGLYCERIN  (NITROSTAT ) 0.4 MG SL tablet Place 1 tablet (0.4 mg total) under the tongue every 5 (five) minutes as needed for chest pain. 07/18/22 11/22/23  Pietro Redell RAMAN, MD  NUPLAZID  34  MG CAPS TAKE 1 CAPSULE BY MOUTH 1 TIME A DAY 09/17/23   Tat, Asberry RAMAN, DO  OVER THE COUNTER MEDICATION Apply 1 application topically daily as needed (pain). Theraworx Pain Cream    [provider]  polyethylene glycol (MIRALAX / GLYCOLAX) packet Take 8.5 g by mouth daily.    [provider]  potassium chloride  SA (KLOR-CON  M) 20 MEQ tablet TAKE 40 MEQ BY MOUTH IN THE MORNING AND TAKE 20 MEQ IN THE EVENING 05/16/23   Cleatus Arlyss RAMAN, MD  rosuvastatin  (CRESTOR ) 20 MG tablet TAKE 1 TAB BY MOUTH EVERYDAY AT BEDTIME 01/30/23   Thapa, Sudan, MD  sennosides-docusate sodium  (SENOKOT-S) 8.6-50 MG tablet Take 1 tablet by mouth daily as needed for constipation. Take along with miralax.    [provider]  spironolactone  (ALDACTONE ) 25 MG tablet TAKE 1/2 TABLET BY MOUTH EVERY DAY 05/15/23   Pietro Redell RAMAN, MD  tirzepatide  (MOUNJARO ) 7.5 MG/0.5ML Pen Inject 7.5 mg into the skin once a week. 01/30/23   Thapa, Sudan, MD  vitamin B-12 (CYANOCOBALAMIN ) 1000 MCG tablet Take 1,000 mcg by mouth daily.    [provider]    Allergies: Penicillins, Klonopin  [clonazepam ], Peanut-containing drug products, and Watermelon [citrullus vulgaris]    Review of Systems  Neurological:  Positive for weakness.    Updated Vital Signs BP 117/64 (BP Location: Left Arm)   Pulse 66   Temp 97.6 F (36.4 C) (Oral)   Resp 12   SpO2 95%   Physical Exam Vitals and nursing note reviewed.  Constitutional:      Appearance: He is well-developed.  HENT:     Head: Normocephalic and atraumatic.  Eyes:     Pupils: Pupils are equal, round, and reactive to light.  Neck:     Vascular: No JVD.  Cardiovascular:     Rate and Rhythm: Normal rate and regular rhythm.     Heart sounds: No murmur heard.    No friction rub. No gallop.  Pulmonary:     Effort: No respiratory distress.     Breath sounds: No wheezing.  Abdominal:     General: There is no distension.     Tenderness: There is no abdominal  tenderness. There is no guarding or rebound.  Musculoskeletal:        General: Normal range of motion.     Cervical back: Normal range of motion and neck supple.  Skin:    Coloration: Skin is not pale.     Findings: No rash.  Neurological:     Mental Status: He is alert and oriented to person, place, and time.     Comments: Left-sided facial droop.  Left-sided weakness.  Diffuse tremor.  Psychiatric:  Behavior: Behavior normal.     (all labs ordered are listed, but only abnormal results are displayed) Labs Reviewed  CBG MONITORING, ED - Abnormal; Notable for the following components:      Result Value   Glucose-Capillary 100 (*)    All other components within normal limits  PROTIME-INR  APTT  CBC  DIFFERENTIAL  COMPREHENSIVE METABOLIC PANEL WITH GFR  ETHANOL  RAPID URINE DRUG SCREEN, HOSP PERFORMED    EKG: EKG Interpretation Date/Time:  Tuesday November 27 2023 14:16:23 EST Ventricular Rate:  67 PR Interval:  56 QRS Duration:  121 QT Interval:  405 QTC Calculation: 428 R Axis:   99  Text Interpretation: Sinus rhythm Short PR interval Nonspecific intraventricular conduction delay Consider anterolateral infarct Abnormal T, consider ischemia, lateral leads Artifact in lead(s) I II III aVR aVL V1 V2 No significant change since last tracing Confirmed by Emil Share [phone]) on 11/27/2023 3:24:55 PM  Radiology: CT HEAD WO CONTRAST Result Date: 11/27/2023 EXAM: CT HEAD WITHOUT CONTRAST 11/27/2023 02:56:29 PM TECHNIQUE: CT of the head was performed without the administration of intravenous contrast. Automated exposure control, iterative reconstruction, and/or weight based adjustment of the mA/kV was utilized to reduce the radiation dose to as low as reasonably achievable. COMPARISON: 11/22/2023 CLINICAL HISTORY: Neuro deficit, acute, stroke suspected. FINDINGS: BRAIN AND VENTRICLES: Bilateral deep brain stimulator leads in place with streak artifact mildly limiting brain  assessment. Mild cerebral atrophy. No acute hemorrhage. No evidence of acute infarct. No hydrocephalus. No extra-axial collection. No mass effect or midline shift. Mild carotid siphon calcifications. ORBITS: Bilateral lens replacement. SINUSES: Polypoid mucosal thickening left maxillary sinus. SOFT TISSUES AND SKULL: Right mastoidectomy. No acute soft tissue abnormality. No skull fracture. IMPRESSION: 1. No acute intracranial abnormality. Electronically signed by: Dasie Hamburg MD 11/27/2023 03:10 PM EST RP Workstation: [id number]     Procedures   Medications Ordered in the ED  carbidopa -levodopa  (SINEMET  CR) 50-200 MG per tablet controlled release 1 tablet (1 tablet Oral Given 11/27/23 1557)                                    Medical Decision Making Amount and/or Complexity of Data Reviewed Labs: ordered. Radiology: ordered.  Risk Prescription drug management.   73 yo M with a chief complaints of inability to stand.  This occurred acutely.  Patient recently had his deep brain stimulator decreased and has also had been favoring his right side as of yesterday evening.  I discussed case with Dr. Matthews, neurology.  Thought not to benefit from code stroke.  Recommended giving his dose of Sinemet  and reassessing.  If has persistent unilateral weakness likely will require admission for MRI unless it can be done more acutely but will need coordination with someone who can deactivate his deep brain stimulator.  Signed out to Dr. Randol, please see their note for further details of care in the ED.  The patients results and plan were reviewed and discussed.   Any x-rays performed were independently reviewed by myself.   Differential diagnosis were considered with the presenting HPI.  Medications  carbidopa -levodopa  (SINEMET  CR) 50-200 MG per tablet controlled release 1 tablet (1 tablet Oral Given 11/27/23 1557)    Vitals:   11/27/23 1421 11/27/23 1432 11/27/23 1536  BP:  117/64   Pulse:   66   Resp:  12   Temp: 97.6 F (36.4 C) 97.6 F (36.4 C)   TempSrc:  Oral Oral   SpO2:  98% 95%    Final diagnoses:  Weakness    Admission/ observation were discussed with the admitting physician, patient and/or family and they are comfortable with the plan.       Final diagnoses:  Weakness    ED Discharge Orders     None          Emil Share, DO 11/27/23 1600

## 2023-11-27 NOTE — ED Notes (Signed)
 CCMD called, pt on monitor

## 2023-11-28 ENCOUNTER — Observation Stay (HOSPITAL_COMMUNITY)

## 2023-11-28 ENCOUNTER — Telehealth (HOSPITAL_COMMUNITY): Payer: Self-pay | Admitting: Pharmacy Technician

## 2023-11-28 ENCOUNTER — Encounter (HOSPITAL_COMMUNITY): Payer: Self-pay | Admitting: Family Medicine

## 2023-11-28 ENCOUNTER — Other Ambulatory Visit (HOSPITAL_COMMUNITY): Payer: Self-pay

## 2023-11-28 DIAGNOSIS — J45901 Unspecified asthma with (acute) exacerbation: Secondary | ICD-10-CM | POA: Diagnosis not present

## 2023-11-28 DIAGNOSIS — D696 Thrombocytopenia, unspecified: Secondary | ICD-10-CM | POA: Diagnosis not present

## 2023-11-28 DIAGNOSIS — E1122 Type 2 diabetes mellitus with diabetic chronic kidney disease: Secondary | ICD-10-CM | POA: Diagnosis not present

## 2023-11-28 DIAGNOSIS — I2510 Atherosclerotic heart disease of native coronary artery without angina pectoris: Secondary | ICD-10-CM | POA: Diagnosis present

## 2023-11-28 DIAGNOSIS — I255 Ischemic cardiomyopathy: Secondary | ICD-10-CM | POA: Diagnosis present

## 2023-11-28 DIAGNOSIS — Z794 Long term (current) use of insulin: Secondary | ICD-10-CM | POA: Diagnosis not present

## 2023-11-28 DIAGNOSIS — H409 Unspecified glaucoma: Secondary | ICD-10-CM | POA: Diagnosis present

## 2023-11-28 DIAGNOSIS — Z66 Do not resuscitate: Secondary | ICD-10-CM | POA: Diagnosis present

## 2023-11-28 DIAGNOSIS — I5020 Unspecified systolic (congestive) heart failure: Secondary | ICD-10-CM | POA: Diagnosis not present

## 2023-11-28 DIAGNOSIS — I5042 Chronic combined systolic (congestive) and diastolic (congestive) heart failure: Secondary | ICD-10-CM | POA: Diagnosis not present

## 2023-11-28 DIAGNOSIS — I639 Cerebral infarction, unspecified: Secondary | ICD-10-CM | POA: Diagnosis not present

## 2023-11-28 DIAGNOSIS — M7989 Other specified soft tissue disorders: Secondary | ICD-10-CM

## 2023-11-28 DIAGNOSIS — Z7985 Long-term (current) use of injectable non-insulin antidiabetic drugs: Secondary | ICD-10-CM | POA: Diagnosis not present

## 2023-11-28 DIAGNOSIS — R471 Dysarthria and anarthria: Secondary | ICD-10-CM | POA: Diagnosis not present

## 2023-11-28 DIAGNOSIS — E039 Hypothyroidism, unspecified: Secondary | ICD-10-CM | POA: Diagnosis present

## 2023-11-28 DIAGNOSIS — G20B1 Parkinson's disease with dyskinesia, without mention of fluctuations: Secondary | ICD-10-CM | POA: Diagnosis present

## 2023-11-28 DIAGNOSIS — G20A1 Parkinson's disease without dyskinesia, without mention of fluctuations: Secondary | ICD-10-CM | POA: Diagnosis present

## 2023-11-28 DIAGNOSIS — I480 Paroxysmal atrial fibrillation: Secondary | ICD-10-CM | POA: Diagnosis present

## 2023-11-28 DIAGNOSIS — G2581 Restless legs syndrome: Secondary | ICD-10-CM | POA: Diagnosis present

## 2023-11-28 DIAGNOSIS — Z7982 Long term (current) use of aspirin: Secondary | ICD-10-CM | POA: Diagnosis not present

## 2023-11-28 DIAGNOSIS — R531 Weakness: Secondary | ICD-10-CM | POA: Diagnosis not present

## 2023-11-28 DIAGNOSIS — M109 Gout, unspecified: Secondary | ICD-10-CM | POA: Diagnosis present

## 2023-11-28 DIAGNOSIS — G20A2 Parkinson's disease without dyskinesia, with fluctuations: Secondary | ICD-10-CM | POA: Diagnosis not present

## 2023-11-28 DIAGNOSIS — K5900 Constipation, unspecified: Secondary | ICD-10-CM | POA: Diagnosis present

## 2023-11-28 DIAGNOSIS — I959 Hypotension, unspecified: Secondary | ICD-10-CM | POA: Diagnosis not present

## 2023-11-28 DIAGNOSIS — G8194 Hemiplegia, unspecified affecting left nondominant side: Secondary | ICD-10-CM | POA: Diagnosis present

## 2023-11-28 DIAGNOSIS — R569 Unspecified convulsions: Secondary | ICD-10-CM | POA: Diagnosis not present

## 2023-11-28 DIAGNOSIS — R55 Syncope and collapse: Secondary | ICD-10-CM | POA: Diagnosis not present

## 2023-11-28 DIAGNOSIS — F0280 Dementia in other diseases classified elsewhere without behavioral disturbance: Secondary | ICD-10-CM | POA: Diagnosis present

## 2023-11-28 DIAGNOSIS — N1831 Chronic kidney disease, stage 3a: Secondary | ICD-10-CM | POA: Diagnosis not present

## 2023-11-28 DIAGNOSIS — Z955 Presence of coronary angioplasty implant and graft: Secondary | ICD-10-CM | POA: Diagnosis not present

## 2023-11-28 DIAGNOSIS — I5023 Acute on chronic systolic (congestive) heart failure: Secondary | ICD-10-CM | POA: Diagnosis present

## 2023-11-28 DIAGNOSIS — R404 Transient alteration of awareness: Secondary | ICD-10-CM | POA: Diagnosis not present

## 2023-11-28 DIAGNOSIS — R5381 Other malaise: Secondary | ICD-10-CM | POA: Diagnosis present

## 2023-11-28 DIAGNOSIS — N179 Acute kidney failure, unspecified: Secondary | ICD-10-CM | POA: Diagnosis not present

## 2023-11-28 DIAGNOSIS — B370 Candidal stomatitis: Secondary | ICD-10-CM | POA: Diagnosis present

## 2023-11-28 DIAGNOSIS — J9601 Acute respiratory failure with hypoxia: Secondary | ICD-10-CM | POA: Diagnosis not present

## 2023-11-28 DIAGNOSIS — E785 Hyperlipidemia, unspecified: Secondary | ICD-10-CM | POA: Diagnosis present

## 2023-11-28 DIAGNOSIS — E119 Type 2 diabetes mellitus without complications: Secondary | ICD-10-CM | POA: Diagnosis not present

## 2023-11-28 DIAGNOSIS — I110 Hypertensive heart disease with heart failure: Secondary | ICD-10-CM | POA: Diagnosis present

## 2023-11-28 DIAGNOSIS — H9193 Unspecified hearing loss, bilateral: Secondary | ICD-10-CM | POA: Diagnosis present

## 2023-11-28 LAB — COMPREHENSIVE METABOLIC PANEL WITH GFR
ALT: 6 U/L (ref 0–44)
AST: <10 U/L — ABNORMAL LOW (ref 15–41)
Albumin: 3.7 g/dL (ref 3.5–5.0)
Alkaline Phosphatase: 59 U/L (ref 38–126)
Anion gap: 12 (ref 5–15)
BUN: 28 mg/dL — ABNORMAL HIGH (ref 8–23)
CO2: 28 mmol/L (ref 22–32)
Calcium: 8.7 mg/dL — ABNORMAL LOW (ref 8.9–10.3)
Chloride: 99 mmol/L (ref 98–111)
Creatinine, Ser: 1.43 mg/dL — ABNORMAL HIGH (ref 0.61–1.24)
GFR, Estimated: 52 mL/min — ABNORMAL LOW (ref >60)
Glucose, Bld: 99 mg/dL (ref 70–99)
Potassium: 3.7 mmol/L (ref 3.5–5.1)
Sodium: 139 mmol/L (ref 135–145)
Total Bilirubin: 1 mg/dL (ref 0.0–1.2)
Total Protein: 6.4 g/dL — ABNORMAL LOW (ref 6.5–8.1)

## 2023-11-28 LAB — CBG MONITORING, ED
Glucose-Capillary: 114 mg/dL — ABNORMAL HIGH (ref 70–99)
Glucose-Capillary: 119 mg/dL — ABNORMAL HIGH (ref 70–99)
Glucose-Capillary: 146 mg/dL — ABNORMAL HIGH (ref 70–99)
Glucose-Capillary: 122 mg/dL — ABNORMAL HIGH (ref 70–99)

## 2023-11-28 LAB — CBC
HCT: 42.1 % (ref 39.0–52.0)
Hemoglobin: 13.9 g/dL (ref 13.0–17.0)
MCH: 29.4 pg (ref 26.0–34.0)
MCHC: 33 g/dL (ref 30.0–36.0)
MCV: 89 fL (ref 80.0–100.0)
Platelets: 132 K/uL — ABNORMAL LOW (ref 150–400)
RBC: 4.73 MIL/uL (ref 4.22–5.81)
RDW: 14 % (ref 11.5–15.5)
WBC: 5.8 K/uL (ref 4.0–10.5)
nRBC: 0 % (ref 0.0–0.2)

## 2023-11-28 LAB — AMMONIA: Ammonia: 27 umol/L (ref 9–35)

## 2023-11-28 LAB — GLUCOSE, CAPILLARY: Glucose-Capillary: 149 mg/dL — ABNORMAL HIGH (ref 70–99)

## 2023-11-28 LAB — VITAMIN B12: Vitamin B-12: 1069 pg/mL — ABNORMAL HIGH (ref 180–914)

## 2023-11-28 NOTE — Progress Notes (Signed)
 Pt's wife states pt's neurologist stopped his cymbalta  6 months ago. Medication was not given this am, pt's ED RN Alaina notified and she will inform pt's hospitalist MD.   Madelin Barefoot, RN SWOT

## 2023-11-28 NOTE — Inpatient Diabetes Management (Signed)
 Inpatient Diabetes Program Recommendations  AACE/ADA: New Consensus Statement on Inpatient Glycemic Control (2015)  Target Ranges:  Prepandial:   less than 140 mg/dL      Peak postprandial:   less than 180 mg/dL (1-2 hours)      Critically ill patients:  140 - 180 mg/dL   Lab Results  Component Value Date   GLUCAP 119 (H) 11/28/2023   HGBA1C 6.2 (A) 11/14/2023    Review of Glycemic Control  Diabetes history: DM 2 Outpatient Diabetes medications:  Mounjaro  7.5 mg weekly.  OmniPod 5 insulin  Pump setting: Using Humalog  U100.  Wife helps him to manage insulin  pump at home. Basal ( 29.6) MN-1.1 u/hour 3:30AM-1.1  6AM-1.3 12PM-1.3 10PM - 1.1   Bolus CHO Ratio (1unit:CHO) MN-1:10   Generally Boluses 3-5 units for breakfast, 4-6 lunch and 6-7 units for dinner, 2-3 units for snacks . Generally entering about 45 -55- 65 g of carbohydrate for the 3 meals    Correction/Sensitivity: MN-1:60   Target: 120    Active insulin  time: 2 hours Current orders for Inpatient glycemic control:  Novolog  0-9 units tid + hs Novolog  4 units tid meal coverage Semglee  15 units qhs  A1c 6.2%  Inpatient Diabetes Program Recommendations:    Note: Since wife assists in managing pump, pt is appropriate for basal bolus while in the hospital until discharged home. Pt see Dr. Mercie with Endocrinology, last visit 11/5 with no changes and a controlled A1c level. Will adjust insulin  if needed. Watch trends for today.  Thanks,  Clotilda Bull RN, MSN, BC-ADM Inpatient Diabetes Coordinator Team Pager [phone] (8a-5p)

## 2023-11-28 NOTE — Progress Notes (Signed)
   Inpatient Rehab Admissions Coordinator :  Per therapy recommendations, patient was screened for CIR candidacy by Heron Leavell RN MSN.  At this time patient appears to be a potential candidate for CIR. Please noted that our beds on CIR are very limited this week. Other AIR level rehabs may need to be pursued. I will place a rehab consult per protocol for full assessment. Please call me with any questions.  Heron Leavell RN MSN Admissions Coordinator [phone]

## 2023-11-28 NOTE — Evaluation (Signed)
 Occupational Therapy Evaluation Patient Details Name: Henry [name] MRN: [id number] DOB: March 04, 1950 Today's Date: 11/28/2023   History of Present Illness   73 y.o. male presents to [facility] hospital on 11/27/2023 with generalized weakness. PMH includes Parkinson's disease s/p deep brain stimulator, HFrEF, HTN, DMII, PAF, dysphagia, R foot dystonia s/p botox  injections.     Clinical Impressions Pt recently completed outpatient PT and OT. He walks with assistance and either a RW or U-step. He is assisted for LB bathing and dressing and all IADLs. He can typically self feed, groom, use a urinal and perform UB ADLs. Pt presents with difficulty initiating movement, generalized weakness and impaired standing balance. He needs +2 assistance for all mobility and max to total assist for ADLs. Patient will benefit from intensive inpatient follow-up therapy, >3 hours/day.      If plan is discharge home, recommend the following:   Two people to help with walking and/or transfers;A lot of help with bathing/dressing/bathroom;Assistance with cooking/housework;Assistance with feeding;Direct supervision/assist for medications management;Direct supervision/assist for financial management;Assist for transportation;Help with stairs or ramp for entrance     Functional Status Assessment   Patient has had a recent decline in their functional status and demonstrates the ability to make significant improvements in function in a reasonable and predictable amount of time.     Equipment Recommendations   None recommended by OT     Recommendations for Other Services         Precautions/Restrictions   Precautions Precautions: Fall Recall of Precautions/Restrictions: Impaired     Mobility Bed Mobility Overal bed mobility: Needs Assistance Bed Mobility: Supine to Sit, Sit to Supine     Supine to sit: Mod assist, HOB elevated, +2 for physical assistance Sit to supine: Min assist, +2 for physical  assistance   General bed mobility comments: assist to initiate LEs toward EOB and to raise trunk, min assist for LEs back into bed and to guide trunk    Transfers Overall transfer level: Needs assistance Equipment used: Rolling walker (2 wheels) Transfers: Sit to/from Stand Sit to Stand: +2 physical assistance, Min assist, From elevated surface           General transfer comment: assist to guide walker, rise and steady, verbal cues to step prior to moving walker      Balance Overall balance assessment: Needs assistance   Sitting balance-Leahy Scale: Fair     Standing balance support: Bilateral upper extremity supported Standing balance-Leahy Scale: Poor                             ADL either performed or assessed with clinical judgement   ADL Overall ADL's : Needs assistance/impaired Eating/Feeding: Maximal assistance;Bed level   Grooming: Wash/dry hands;Bed level;Total assistance   Upper Body Bathing: Maximal assistance;Sitting   Lower Body Bathing: Total assistance;+2 for physical assistance;Sit to/from stand   Upper Body Dressing : Maximal assistance;Sitting   Lower Body Dressing: Total assistance;+2 for physical assistance;Sit to/from stand   Toilet Transfer: Minimal assistance;+2 for physical assistance;Stand-pivot;Rolling walker (2 wheels);BSC/3in1   Toileting- Clothing Manipulation and Hygiene: Total assistance;+2 for physical assistance;Sit to/from stand       Functional mobility during ADLs: Minimal assistance;+2 for physical assistance;Rolling walker (2 wheels)       Vision Baseline Vision/History: 1 Wears glasses Patient Visual Report: No change from baseline Additional Comments: wears readers     Perception         Praxis  Pertinent Vitals/Pain Pain Assessment Pain Assessment: No/denies pain     Extremity/Trunk Assessment Upper Extremity Assessment Upper Extremity Assessment: Right hand dominant;Generalized  weakness;RUE deficits/detail;LUE deficits/detail RUE Deficits / Details: mild tremor RUE Coordination: decreased fine motor;decreased gross motor LUE Deficits / Details: mild tremor LUE Coordination: decreased fine motor;decreased gross motor   Lower Extremity Assessment Lower Extremity Assessment: Defer to PT evaluation   Cervical / Trunk Assessment Cervical / Trunk Assessment: Other exceptions (weakness)   Communication Communication Communication: Impaired Factors Affecting Communication: Reduced clarity of speech;Hearing impaired   Cognition Arousal: Alert Behavior During Therapy: Flat affect, Lability               OT - Cognition Comments: likely baseline cognition, heavily focused on condom catheter falling off                 Following commands: Impaired Following commands impaired: Follows one step commands with increased time     Cueing  General Comments   Cueing Techniques: Verbal cues;Tactile cues      Exercises     Shoulder Instructions      Home Living Family/patient expects to be discharged to:: Private residence Living Arrangements: Spouse/significant other;Children;Other relatives (daughter and son in law) Available Help at Discharge: Family;Available 24 hours/day Type of Home: House Home Access: Ramped entrance     Home Layout: One level     Bathroom Shower/Tub: Producer, Television/film/video: Handicapped height     Home Equipment: Agricultural Consultant (2 wheels);Other (comment);Grab bars - toilet;Grab bars - tub/shower;Shower seat - built in;Hand held Location Manager (U-step)          Prior Functioning/Environment Prior Level of Function : Needs assist             Mobility Comments: ambulates with U-step or RW and assistance ADLs Comments: set up for eating, grooms with set up, assisted to wash lower body and to dry off, assist for lower body dressing    OT Problem List: Impaired balance (sitting  and/or standing);Decreased activity tolerance;Decreased strength;Decreased cognition;Decreased coordination;Decreased safety awareness;Decreased knowledge of use of DME or AE;Obesity;Impaired UE functional use   OT Treatment/Interventions: Self-care/ADL training;DME and/or AE instruction;Therapeutic activities;Cognitive remediation/compensation;Patient/family education;Balance training      OT Goals(Current goals can be found in the care plan section)   Acute Rehab OT Goals OT Goal Formulation: With patient/family Time For Goal Achievement: 12/12/23 Potential to Achieve Goals: Good ADL Goals Pt Will Perform Eating: with set-up;sitting Pt Will Perform Grooming: with set-up;sitting Pt Will Perform Upper Body Bathing: with min assist;sitting Pt Will Perform Upper Body Dressing: with min assist;sitting Pt Will Transfer to Toilet: with min assist;ambulating;bedside commode   OT Frequency:  Min 2X/week    Co-evaluation PT/OT/SLP Co-Evaluation/Treatment: Yes Reason for Co-Treatment: For patient/therapist safety   OT goals addressed during session: ADL's and self-care      AM-PAC OT 6 Clicks Daily Activity     Outcome Measure Help from another person eating meals?: A Lot Help from another person taking care of personal grooming?: Total Help from another person toileting, which includes using toliet, bedpan, or urinal?: Total Help from another person bathing (including washing, rinsing, drying)?: A Lot Help from another person to put on and taking off regular upper body clothing?: A Lot Help from another person to put on and taking off regular lower body clothing?: Total 6 Click Score: 9   End of Session Equipment Utilized During Treatment: Gait belt;Rolling walker (2 wheels)  Activity Tolerance:  Patient tolerated treatment well Patient left: in bed;with call bell/phone within reach;with family/visitor present  OT Visit Diagnosis: Unsteadiness on feet (R26.81);Other abnormalities  of gait and mobility (R26.89);Muscle weakness (generalized) (M62.81);Other symptoms and signs involving cognitive function                Time: 1316-1400 OT Time Calculation (min): 44 min Charges:  OT General Charges $OT Visit: 1 Visit OT Evaluation $OT Eval Moderate Complexity: 1 Mod  Henry [name], OTR/L Acute Rehabilitation Services Office: [phone]  Kennth Henry Helling 11/28/2023, 2:17 PM

## 2023-11-28 NOTE — Evaluation (Signed)
 Physical Therapy Evaluation Patient Details Name: Henry [name] MRN: [id number] DOB: 08/12/1950 Today's Date: 11/28/2023  History of Present Illness  73 y.o. male presents to [facility] hospital on 11/27/2023 with generalized weakness. PMH includes Parkinson's disease s/p deep brain stimulator, HFrEF, HTN, DMII, PAF, dysphagia, R foot dystonia s/p botox  injections.  Clinical Impression  Pt presents to PT with deficits in functional mobility, gait, balance, strength, power, motor control and planning. Pt demonstrates increased difficulty initiating mobility compared to baseline, although family and pt report much improvement since recent symptoms onset. Pt requires physical assistance for bed mobility and transfers, as well as assistance to help manage RW with changes in direction. Pt remains at a high risk for falls due to weakness and frequent freezing/festinating gait. Patient will benefit from intensive inpatient follow-up therapy, >3 hours/day.        If plan is discharge home, recommend the following: A lot of help with walking and/or transfers;A lot of help with bathing/dressing/bathroom;Assistance with cooking/housework;Assist for transportation;Help with stairs or ramp for entrance   Can travel by private vehicle        Equipment Recommendations None recommended by PT  Recommendations for Other Services  Rehab consult    Functional Status Assessment Patient has had a recent decline in their functional status and demonstrates the ability to make significant improvements in function in a reasonable and predictable amount of time.     Precautions / Restrictions Precautions Precautions: Fall Recall of Precautions/Restrictions: Impaired Restrictions Weight Bearing Restrictions Per Provider Order: No      Mobility  Bed Mobility Overal bed mobility: Needs Assistance Bed Mobility: Supine to Sit, Sit to Supine     Supine to sit: Mod assist, HOB elevated, +2 for physical  assistance Sit to supine: Min assist, +2 for physical assistance   General bed mobility comments: assist to initiate LEs toward EOB and to raise trunk, min assist for LEs back into bed and to guide trunk    Transfers Overall transfer level: Needs assistance Equipment used: Rolling walker (2 wheels) Transfers: Sit to/from Stand, Bed to chair/wheelchair/BSC Sit to Stand: +2 physical assistance, Min assist, From elevated surface   Step pivot transfers: Min assist, +2 physical assistance       General transfer comment: assist to guide walker during step pivot as pt often continues to move walker while feet are freezing, leading to anterior or lateral lean. Short shuffling steps when transferring    Ambulation/Gait Ambulation/Gait assistance: Min assist, +2 physical assistance Gait Distance (Feet): 4 Feet Assistive device: Rolling walker (2 wheels) Gait Pattern/deviations: Shuffle Gait velocity: reduced Gait velocity interpretation: <1.31 ft/sec, indicative of household ambulator   General Gait Details: short shuffling steps, pt with intermittent freezing and benefits from verbal and visual cues for increased step length  Stairs            Wheelchair Mobility     Tilt Bed    Modified Rankin (Stroke Patients Only)       Balance Overall balance assessment: Needs assistance Sitting-balance support: Feet supported, No upper extremity supported Sitting balance-Leahy Scale: Fair     Standing balance support: Bilateral upper extremity supported, Reliant on assistive device for balance Standing balance-Leahy Scale: Poor                               Pertinent Vitals/Pain Pain Assessment Pain Assessment: No/denies pain    Home Living Family/patient expects to be discharged to::  Private residence Living Arrangements: Spouse/significant other;Children;Other relatives Available Help at Discharge: Family;Available 24 hours/day Type of Home: House Home  Access: Ramped entrance       Home Layout: One level Home Equipment: Agricultural Consultant (2 wheels);Other (comment);Grab bars - toilet;Grab bars - tub/shower;Shower seat - built in;Hand held Location Manager (U-step walker)      Prior Function Prior Level of Function : Needs assist             Mobility Comments: ambulates with U-step or RW and assistance. sleeps in his lift chair ADLs Comments: set up for eating, grooms with set up, assisted to wash lower body and to dry off, assist for lower body dressing     Extremity/Trunk Assessment   Upper Extremity Assessment Upper Extremity Assessment: Defer to OT evaluation RUE Deficits / Details: mild tremor RUE Coordination: decreased fine motor;decreased gross motor LUE Deficits / Details: mild tremor LUE Coordination: decreased fine motor;decreased gross motor    Lower Extremity Assessment Lower Extremity Assessment: Generalized weakness    Cervical / Trunk Assessment Cervical / Trunk Assessment: Other exceptions Cervical / Trunk Exceptions: body habitus  Communication   Communication Communication: Impaired Factors Affecting Communication: Reduced clarity of speech;Hearing impaired    Cognition Arousal: Alert Behavior During Therapy: Flat affect, Lability (pt at times close to crying due to joy of being able to mobilize again)   PT - Cognitive impairments: Difficult to assess, Problem solving Difficult to assess due to: Impaired communication                       Following commands: Impaired Following commands impaired: Follows one step commands with increased time     Cueing Cueing Techniques: Verbal cues, Tactile cues     General Comments General comments (skin integrity, edema, etc.): VSS on RA    Exercises     Assessment/Plan    PT Assessment Patient needs continued PT services  PT Problem List Decreased strength;Decreased activity tolerance;Decreased balance;Decreased  mobility;Decreased knowledge of use of DME;Decreased safety awareness;Decreased knowledge of precautions;Decreased cognition       PT Treatment Interventions DME instruction;Gait training;Stair training;Functional mobility training;Therapeutic activities;Balance training;Therapeutic exercise;Neuromuscular re-education;Cognitive remediation;Patient/family education    PT Goals (Current goals can be found in the Care Plan section)  Acute Rehab PT Goals Patient Stated Goal: to return to baseline, ambulating for household distances with use of RW or U-walker PT Goal Formulation: With patient/family Time For Goal Achievement: 12/12/23 Potential to Achieve Goals: Fair    Frequency Min 2X/week     Co-evaluation PT/OT/SLP Co-Evaluation/Treatment: Yes Reason for Co-Treatment: Complexity of the patient's impairments (multi-system involvement);Necessary to address cognition/behavior during functional activity;For patient/therapist safety;To address functional/ADL transfers PT goals addressed during session: Mobility/safety with mobility;Balance;Proper use of DME;Strengthening/ROM OT goals addressed during session: ADL's and self-care       AM-PAC PT 6 Clicks Mobility  Outcome Measure Help needed turning from your back to your side while in a flat bed without using bedrails?: A Lot Help needed moving from lying on your back to sitting on the side of a flat bed without using bedrails?: A Lot Help needed moving to and from a bed to a chair (including a wheelchair)?: A Lot Help needed standing up from a chair using your arms (e.g., wheelchair or bedside chair)?: A Little Help needed to walk in hospital room?: Total Help needed climbing 3-5 steps with a railing? : Total 6 Click Score: 11    End of Session  Equipment Utilized During Treatment: Gait belt Activity Tolerance: Patient tolerated treatment well Patient left: in bed;with call bell/phone within reach;with family/visitor present Nurse  Communication: Mobility status PT Visit Diagnosis: Other abnormalities of gait and mobility (R26.89);Muscle weakness (generalized) (M62.81);Other symptoms and signs involving the nervous system (R29.898)    Time: 1316-1400 PT Time Calculation (min) (ACUTE ONLY): 44 min   Charges:   PT Evaluation $PT Eval Moderate Complexity: 1 Mod   PT General Charges $$ ACUTE PT VISIT: 1 Visit         Bernardino JINNY Ruth, PT, DPT Acute Rehabilitation Office [phone]   Bernardino JINNY Ruth 11/28/2023, 3:21 PM

## 2023-11-28 NOTE — ED Notes (Signed)
Patient transported to Ultrasound-Vascular 

## 2023-11-28 NOTE — Progress Notes (Signed)
 EEG complete - results pending

## 2023-11-28 NOTE — Progress Notes (Signed)
 PT Cancellation Note  Patient Details Name: Henry [name] MRN: [id number] DOB: 27-Sep-1950   Cancelled Treatment:    Reason Eval/Treat Not Completed: Medical issues which prohibited therapy. Notes indicate plan for RLE ultrasound. PT will follow up after ultrasound is complete and results are documented.   Henry [name] 11/28/2023, 10:05 AM

## 2023-11-28 NOTE — Progress Notes (Signed)
 Venous duplex lower ext  has been completed. Refer to [facility] under chart review to view preliminary results.   11/28/2023  11:28 AM Cornie Mccomber, Ricka BIRCH

## 2023-11-28 NOTE — ED Notes (Signed)
 PT/OT at bedside.

## 2023-11-28 NOTE — Progress Notes (Signed)
 [facility]  PROGRESS NOTE  Henry [name] FMW:[id number] DOB: Nov 02, 1950 DOA: 11/27/2023 PCP: Henry Arlyss RAMAN, MD   Brief HPI:    73 y.o. male with medical history significant of parkinson's disease s/p DBS, HFrEF, HTN, T2DM and multiple other medical issues here with generalized weakness.  Henry [name] notes his symptoms started 2-3 weeks ago. He has a longstanding history of parkinson's. Got a DBS placed in early 2000's. Typically able to walk with a walker, feed himself. He's noticed more freezing episodes in the past 2-3 weeks. More mobility issues. He saw his neurologist on 11/13 and at that time was noted to have right facial pulling. His DBS setting was changed at neurologist office. Symptoms became worse after change of setting of DBS.  Case was discussed with neurology who recommended admission and EEG/MRI and formal consult if concerns.     Assessment/Plan:   Generalized weakness in  setting of Parkinson disease - Usually able to walk with walker at baseline;  - Started having worsening freezing spells with progressive weakness - Was seen by neurology, at that time DBS turned off-device amplitude was turned down as a result - Patient symptoms got worse after that - CT head showed no acute abnormality - MRI brain not obtained as not compatible with patient's device - Neurology recommended EEG; which did not show up after home discharges -Continue Sinemet  -will get PT eval -  Hypotension - hold BP meds -check orthostatic vital signs  Chronic systolic heart failure -Echocardiogram from 01/2042 showed EF of 30 to 35%, grade 2 diastolic 2 diastolic dysfunction - BNP was 11.4 - Hold Lasix  at this time due to worsening renal function and hypotension  Right Lower Extremity Swelling Follow venous ultrasound    T2DM On insulin  pump at home Transition to basal/bolus and SSI while here - conservative dosing - adjust as needed Will ask for assistance from diabetes  coordinators   HFrEF Doesn't appear grossly overloaded at this time Follow BNP.   Continue lasix , spironolactone , losartan , metoprolol  Strict I/O, daily weights   HTN Lasix , spironolactone , losartan , metop  Dyslipidemia Crestor   Asthma Continue breathing treatments   Glaucoma Cosopt    Gout allopurinol        DVT prophylaxis: Lovenox   Medications     allopurinol   150 mg Oral Daily   aspirin  EC  81 mg Oral Daily   Carbidopa -Levodopa  ER  2 tablet Oral 5 X Daily   cholecalciferol   2,000 Units Oral Daily   cyanocobalamin   1,000 mcg Oral Daily   dorzolamide -timolol   1 drop Right Eye BID   DULoxetine   20 mg Oral Daily   enoxaparin  (LOVENOX ) injection  50 mg Subcutaneous Q24H   fluticasone  furoate-vilanterol  1 puff Inhalation Daily   furosemide   60 mg Oral Daily   icosapent  Ethyl  2 g Oral BID   insulin  aspart  0-5 Units Subcutaneous QHS   insulin  aspart  0-9 Units Subcutaneous TID WC   insulin  aspart  4 Units Subcutaneous TID WC   insulin  glargine-yfgn  15 Units Subcutaneous QHS   levothyroxine   150 mcg Oral Q0600   losartan   25 mg Oral Daily   metoprolol  succinate  25 mg Oral Daily   mirabegron  ER  25 mg Oral Daily   Pimavanserin  Tartrate  34 mg Oral Daily   polyethylene glycol  8.5 g Oral Daily   rosuvastatin   20 mg Oral Daily   spironolactone   12.5 mg Oral Daily     Data Reviewed:   CBG:  Recent Labs  Lab 11/27/23 1532 11/27/23 2131 11/28/23 0138 11/28/23 0748  GLUCAP 100* 140* 114* 119*    SpO2: 95 %    Vitals:   11/28/23 0516 11/28/23 0645 11/28/23 0700 11/28/23 0800  BP: (!) 97/49 (!) 102/58 94/62 (!) 102/58  Pulse: 67 72 72 72  Resp: 14 19 16 19   Temp: 97.6 F (36.4 C)     TempSrc: Oral     SpO2: 94% 95% 96% 95%      Data Reviewed:  Basic Metabolic Panel: Recent Labs  Lab 11/27/23 1545 11/27/23 2157 11/28/23 0305  NA 138  --  139  K 4.7  --  3.7  CL 96*  --  99  CO2 27  --  28  GLUCOSE 96  --  99  BUN 28*  --  28*   CREATININE 1.15 1.41* 1.43*  CALCIUM  8.9  --  8.7*    CBC: Recent Labs  Lab 11/27/23 1545 11/27/23 2157 11/28/23 0305  WBC 6.6 7.0 5.8  NEUTROABS 3.9  --   --   HGB 15.3 14.7 13.9  HCT 47.4 45.4 42.1  MCV 89.4 89.2 89.0  PLT 144* 146* 132*    LFT Recent Labs  Lab 11/27/23 1545 11/28/23 0305  AST 21 <10*  ALT 5 6  ALKPHOS 66 59  BILITOT 1.3* 1.0  PROT 6.5 6.4*  ALBUMIN 3.9 3.7     Antibiotics: Anti-infectives (From admission, onward)    None        CONSULTS   Code Status: DNR  Family Communication: Discussed with patient's wife at bedside.     Subjective   Continues to have lethargy and  weakness   Objective    Physical Examination:   Appears in no acute distress S1-S2, regular Lungs clear to auscultation bilaterally Abdomen soft, nontender  Status is: Inpatient:             Henry [name]   [facility] If 7PM-7AM, please contact night-coverage at [email or web address], Office  [phone]   11/28/2023, 8:25 AM  LOS: 0 days

## 2023-11-28 NOTE — ED Notes (Signed)
 Hospitalist made aware of Pt's BP in the mid-80s.  This clinical research associate inquired about medications scheduled that will effect BP.

## 2023-11-28 NOTE — Procedures (Signed)
 Patient Name: Henry [name]  MRN: [id number]  Epilepsy Attending: Arlin MALVA Krebs  Referring Physician/Provider: Perri DELENA Meliton Mickey., MD  Date: 11/28/2023 Duration: 23.12 mins  Patient history: 73 year old male with weakness.  EEG to evaluate for seizure.  Level of alertness: Awake, drowsy  AEDs during EEG study: None  Technical aspects: This EEG study was done with scalp electrodes positioned according to the 10-20 International system of electrode placement. Electrical activity was reviewed with band pass filter of 1-70Hz , sensitivity of 7 uV/mm, display speed of 42mm/sec with a 60Hz  notched filter applied as appropriate. EEG data were recorded continuously and digitally stored.  Video monitoring was available and reviewed as appropriate.  Description: The posterior dominant rhythm consists of 7.5Hz  activity of moderate voltage (25-35 uV) seen predominantly in posterior head regions, symmetric and reactive to eye opening and eye closing. Drowsiness was characterized by attenuation of the posterior background rhythm.  EEG showed intermittent generalized 5 to 7 Hz theta and delta slowing.  Hyperventilation and photic stimulation were not performed.     ABNORMALITY - Intermittent slow, generalized  IMPRESSION: This study is suggestive of mild generalized cerebral dysfunction (encephalopathy). No seizures or epileptiform discharges were seen throughout the recording.  Rodnesha Elie O Vallarie Fei

## 2023-11-28 NOTE — Progress Notes (Signed)
 OT Cancellation Note  Patient Details Name: Henry [name] MRN: [id number] DOB: 07/08/1950   Cancelled Treatment:    Reason Eval/Treat Not Completed: Medical issues which prohibited therapy (Pt awaiting US  of R LE to rule out DVT)  [name] 11/28/2023, 10:20 AM [name] HERO, OTR/L Acute Rehabilitation Services Office: [phone]

## 2023-11-28 NOTE — Telephone Encounter (Signed)
 Patient Product/process Development Scientist completed.    The patient is insured through [employer]. Patient has Medicare and is not eligible for a copay card, but may be able to apply for patient assistance or Medicare RX Payment Plan (Patient Must reach out to their plan, if eligible for payment plan), if available.    Ran test claim for Breo Ellipta 200-25 mcg and the current 30 day co-pay is $0.00.   This test claim was processed through [facility]- copay amounts may vary at other pharmacies due to pharmacy/plan contracts, or as the patient moves through the different stages of their insurance plan.     Reyes Sharps, CPHT Pharmacy Technician Patient Advocate Specialist Lead [facility] Patient Advocate Team Direct Number: [phone]  Fax: [phone]

## 2023-11-28 NOTE — ED Notes (Signed)
 On call hospitalist paged for patient's low BP and restlessness. Doctor ordered Albumin  and melatonin for sleep.

## 2023-11-29 ENCOUNTER — Inpatient Hospital Stay (HOSPITAL_COMMUNITY)

## 2023-11-29 DIAGNOSIS — G20A2 Parkinson's disease without dyskinesia, with fluctuations: Secondary | ICD-10-CM | POA: Diagnosis not present

## 2023-11-29 DIAGNOSIS — R569 Unspecified convulsions: Secondary | ICD-10-CM | POA: Diagnosis not present

## 2023-11-29 DIAGNOSIS — I639 Cerebral infarction, unspecified: Secondary | ICD-10-CM

## 2023-11-29 DIAGNOSIS — I4891 Unspecified atrial fibrillation: Secondary | ICD-10-CM

## 2023-11-29 DIAGNOSIS — F0280 Dementia in other diseases classified elsewhere without behavioral disturbance: Secondary | ICD-10-CM

## 2023-11-29 DIAGNOSIS — G20A1 Parkinson's disease without dyskinesia, without mention of fluctuations: Secondary | ICD-10-CM | POA: Diagnosis not present

## 2023-11-29 DIAGNOSIS — R29818 Other symptoms and signs involving the nervous system: Secondary | ICD-10-CM | POA: Diagnosis not present

## 2023-11-29 DIAGNOSIS — Z9181 History of falling: Secondary | ICD-10-CM

## 2023-11-29 DIAGNOSIS — R531 Weakness: Secondary | ICD-10-CM | POA: Diagnosis not present

## 2023-11-29 LAB — COMPREHENSIVE METABOLIC PANEL WITH GFR
ALT: <5 U/L (ref 0–44)
AST: <10 U/L — ABNORMAL LOW (ref 15–41)
Albumin: 3.5 g/dL (ref 3.5–5.0)
Alkaline Phosphatase: 61 U/L (ref 38–126)
Anion gap: 13 (ref 5–15)
BUN: 25 mg/dL — ABNORMAL HIGH (ref 8–23)
CO2: 27 mmol/L (ref 22–32)
Calcium: 8.8 mg/dL — ABNORMAL LOW (ref 8.9–10.3)
Chloride: 96 mmol/L — ABNORMAL LOW (ref 98–111)
Creatinine, Ser: 1.42 mg/dL — ABNORMAL HIGH (ref 0.61–1.24)
GFR, Estimated: 53 mL/min — ABNORMAL LOW (ref >60)
Glucose, Bld: 101 mg/dL — ABNORMAL HIGH (ref 70–99)
Potassium: 3.8 mmol/L (ref 3.5–5.1)
Sodium: 136 mmol/L (ref 135–145)
Total Bilirubin: 1.1 mg/dL (ref 0.0–1.2)
Total Protein: 6.2 g/dL — ABNORMAL LOW (ref 6.5–8.1)

## 2023-11-29 LAB — CBC
HCT: 42 % (ref 39.0–52.0)
Hemoglobin: 13.8 g/dL (ref 13.0–17.0)
MCH: 28.8 pg (ref 26.0–34.0)
MCHC: 32.9 g/dL (ref 30.0–36.0)
MCV: 87.7 fL (ref 80.0–100.0)
Platelets: 132 K/uL — ABNORMAL LOW (ref 150–400)
RBC: 4.79 MIL/uL (ref 4.22–5.81)
RDW: 13.9 % (ref 11.5–15.5)
WBC: 5.4 K/uL (ref 4.0–10.5)
nRBC: 0 % (ref 0.0–0.2)

## 2023-11-29 LAB — GLUCOSE, CAPILLARY
Glucose-Capillary: 106 mg/dL — ABNORMAL HIGH (ref 70–99)
Glucose-Capillary: 134 mg/dL — ABNORMAL HIGH (ref 70–99)
Glucose-Capillary: 124 mg/dL — ABNORMAL HIGH (ref 70–99)
Glucose-Capillary: 155 mg/dL — ABNORMAL HIGH (ref 70–99)

## 2023-11-29 NOTE — Progress Notes (Signed)
 [facility]  PROGRESS NOTE  Henry [name] FMW:[id number] DOB: 1950-09-09 DOA: 11/27/2023 PCP: Cleatus Arlyss RAMAN, MD   Brief HPI:    73 y.o. male with medical history significant of parkinson's disease s/p DBS, HFrEF, HTN, T2DM and multiple other medical issues here with generalized weakness.  [name] notes his symptoms started 2-3 weeks ago. He has a longstanding history of parkinson's. Got a DBS placed in early 2000's. Typically able to walk with a walker, feed himself. He's noticed more freezing episodes in the past 2-3 weeks. More mobility issues. He saw his neurologist on 11/13 and at that time was noted to have right facial pulling. His DBS setting was changed at neurologist office. Symptoms became worse after change of setting of DBS.  Case was discussed with neurology who recommended admission and EEG/MRI and formal consult if concerns.     Assessment/Plan:   Generalized weakness in  setting of Parkinson disease - Usually able to walk with walker at baseline;  - Started having worsening freezing spells with progressive weakness - Was seen by neurology, at that time DBS turned off-device amplitude was turned down as a result - Patient symptoms got worse after that - CT head showed no acute abnormality - MRI brain not obtained as not compatible with patient's device - Neurology recommended EEG; which did not show up after home discharges -Continue Sinemet  -will get PT eval  ?  Seizure - Appreciate neurology input - EEG was unremarkable - Plan for continuous EEG monitoring in the hospital  Hypotension/?  Presyncope Blood pressure is improved after holding BP meds -check orthostatic vital signs   Chronic systolic heart failure -Echocardiogram from 01/2042 showed EF of 30 to 35%, grade 2 diastolic 2 diastolic dysfunction - BNP was 11.4 - Hold Lasix  at this time due to worsening renal function and hypotension - BP meds on hold as above Continue to hold lasix ,  spironolactone , losartan , metoprolol   Right Lower Extremity Swelling Follow venous ultrasound    T2DM On insulin  pump at home Transition to basal/bolus and SSI while here - conservative dosing - adjust as needed Will ask for assistance from diabetes coordinators    Dyslipidemia Crestor   Asthma Continue breathing treatments   Glaucoma Cosopt   Gout allopurinol        DVT prophylaxis: Lovenox  Medications     allopurinol   150 mg Oral Daily   aspirin  EC  81 mg Oral Daily   Carbidopa -Levodopa  ER  2 tablet Oral 5 X Daily   cholecalciferol  2,000 Units Oral Daily   cyanocobalamin   1,000 mcg Oral Daily   dorzolamide-timolol  1 drop Right Eye BID   DULoxetine   20 mg Oral Daily   enoxaparin (LOVENOX) injection  50 mg Subcutaneous Q24H   fluticasone  furoate-vilanterol  1 puff Inhalation Daily   icosapent  Ethyl  2 g Oral BID   insulin  aspart  0-5 Units Subcutaneous QHS   insulin  aspart  0-9 Units Subcutaneous TID WC   insulin  aspart  4 Units Subcutaneous TID WC   insulin  glargine-yfgn  15 Units Subcutaneous QHS   levothyroxine   150 mcg Oral Q0600   mirabegron  ER  25 mg Oral Daily   Pimavanserin Tartrate   34 mg Oral Daily   polyethylene glycol  8.5 g Oral Daily   rosuvastatin   20 mg Oral Daily     Data Reviewed:   CBG:  Recent Labs  Lab 11/28/23 0748 11/28/23 1145 11/28/23 1838 11/28/23 2148 11/29/23 0607  GLUCAP 119* 146* 122* 149*  106*    SpO2: 94 %    Vitals:   11/29/23 0032 11/29/23 0414 11/29/23 0756 11/29/23 0812  BP: (!) 114/57 103/66 (!) 101/54   Pulse: 66 68 71   Resp: 15 16 18    Temp: 98.7 F (37.1 C) 98.8 F (37.1 C) 98 F (36.7 C)   TempSrc: Axillary Oral Oral   SpO2: 94% 95% 94% 94%  Weight:      Height:          Data Reviewed:  Basic Metabolic Panel: Recent Labs  Lab 11/27/23 1545 11/27/23 2157 11/28/23 0305 11/29/23 0404  NA 138  --  139 136  K 4.7  --  3.7 3.8  CL 96*  --  99 96*  CO2 27  --  28 27  GLUCOSE 96  --   99 101*  BUN 28*  --  28* 25*  CREATININE 1.15 1.41* 1.43* 1.42*  CALCIUM  8.9  --  8.7* 8.8*    CBC: Recent Labs  Lab 11/27/23 1545 11/27/23 2157 11/28/23 0305 11/29/23 0404  WBC 6.6 7.0 5.8 5.4  NEUTROABS 3.9  --   --   --   HGB 15.3 14.7 13.9 13.8  HCT 47.4 45.4 42.1 42.0  MCV 89.4 89.2 89.0 87.7  PLT 144* 146* 132* 132*    LFT Recent Labs  Lab 11/27/23 1545 11/28/23 0305 11/29/23 0404  AST 21 <10* <10*  ALT 5 6 <5  ALKPHOS 66 59 61  BILITOT 1.3* 1.0 1.1  PROT 6.5 6.4* 6.2*  ALBUMIN 3.9 3.7 3.5     Antibiotics: Anti-infectives (From admission, onward)    None        CONSULTS   Code Status: DNR  Family Communication: Discussed with patient's daughter and son at bedside     Subjective   Patient is much more alert today.  Appreciate neurology consultation.  He is supposed to get continuous EEG monitoring today.  Blood pressure has improved after stopping antihypertensive medications.   Objective    Physical Examination:  Appears lethargic Alert, oriented x 3, answering questions appropriately Lungs are clear to auscultation bilaterally Abdomen is soft, nontender, no organomegaly Extremities no edema  Status is: Inpatient:             [name]   [facility] If 7PM-7AM, please contact night-coverage at [email or web address], Office  [phone]   11/29/2023, 8:55 AM  LOS: 1 day

## 2023-11-29 NOTE — TOC CM/SW Note (Signed)
 Transition of Care [facility]) - Inpatient Brief Assessment   Patient Details  Name: Henry [name] MRN: [id number] Date of Birth: Jan 09, 1951  [facility] CM/SW Contact:    [name], RN Phone Number: 11/29/2023, 4:23 PM   Clinical Narrative:  Awaiting CIR eval.  CM will continue to follow.  Transition of Care Asessment: Insurance and Status: Insurance coverage has been reviewed Patient has primary care physician: Yes Home environment has been reviewed: home with spouse     Social Drivers of Health Review: SDOH reviewed no interventions necessary Readmission risk has been reviewed: Yes Transition of care needs: transition of care needs identified, TOC will continue to follow

## 2023-11-29 NOTE — Plan of Care (Signed)

## 2023-11-29 NOTE — Progress Notes (Signed)
 LTM EEG hooked up and recording with CT compatible leads. [facility] is monitoring.

## 2023-11-29 NOTE — Plan of Care (Signed)
  Problem: Fluid Volume: Goal: Ability to maintain a balanced intake and output will improve Outcome: Progressing   Problem: Skin Integrity: Goal: Risk for impaired skin integrity will decrease Outcome: Progressing   Problem: Tissue Perfusion: Goal: Adequacy of tissue perfusion will improve Outcome: Progressing   Problem: Clinical Measurements: Goal: Respiratory complications will improve Outcome: Progressing Goal: Cardiovascular complication will be avoided Outcome: Progressing   Problem: Nutrition: Goal: Adequate nutrition will be maintained Outcome: Progressing   Problem: Elimination: Goal: Will not experience complications related to urinary retention Outcome: Progressing   Problem: Skin Integrity: Goal: Risk for impaired skin integrity will decrease Outcome: Progressing

## 2023-11-29 NOTE — Progress Notes (Signed)
 Pt had one staring episode per daughter at around 1208 for few seconds, she pressed the event button, MD notified

## 2023-11-29 NOTE — Progress Notes (Signed)
 Inpatient Rehab Coordinator Note:  I met with patient at bedside to discuss CIR recommendations and goals/expectations of CIR stay.  We reviewed 3 hrs/day of therapy, physician follow up, and average length of stay 2 weeks (dependent upon progress) with goals tbd.  On reviewing chart it appears that patient may be at or very near his base line.  Most recent outpatient therapy notes have him performing ADLs at overall mod/max assist level and functional mobility at min assist level.  Still on LT EEG today so will f/u with pt/family tomorrow.   Reche Lowers, PT, DPT Admissions Coordinator [phone] 11/29/23 4:09 PM

## 2023-11-29 NOTE — Consult Note (Signed)
 NEUROLOGY CONSULT NOTE   Date of service: November 29, 2023 Patient Name: Henry [name] MRN:  [id number] DOB:  [birth year] Chief Complaint: L sided weakness, difficulty walking Requesting Provider: Drusilla Sabas RAMAN, MD  History of Present Illness  Henry [name] is a 73 y.o. male with hx of afibb not on AC due to falls, Parkinsons disease on Siemet and s/p DBS in early 200s, hx of HTN, GERD, DM2, deaf, MI, OSA not compliant with CPAP who presents with difficulty walking, swelling in the R foot and BL hand.   Daughter at the bedside provides most of the history as patient is asleep.  They saw Dr. Evonnie on 11/13. While with Dr. Evonnie, patient had an episode of eyes open, looking through you and not very responsive and frozen. He has had episodes like these in the past but never this long and not in front of his physician. He was seeing Dr. Evonnie on that day for R facial spasm. His tongue was all the way to the right and touching his cheek. His DBS was drastically turned down and that helped. He was able to walk to the car from the appointment with his Ustep walker. Dr. Evonnie had ordered a CT of his head and a routine EEG. Pt slept most of the day on Thursday. Friday, he seemed fine but in the evening, started urination more often and had swelling in his feet. Saturday and Sunday, urinating even more and increased edema in BL feet. Tuesday, ate a breakfast biscuit that his son got him at 715 in the morning and then when daughter got to him at 10AM and tried to take him to his appointment, he was unable to get up and partially standing off the recline and started starring off, poorly responsive with eyes open while partially standing from the recliner. He was leaning to his right.  She brought him to the ED where he was admitted and a CT Head was obtianed and was negative for any acute abnormalities and rEEG was negative for seizures.  Labs with TSH elevated mildly to 5.9 but othewise, were nonrevealing. UA  is not concerning for a UTI.   ROS  Comprehensive ROS performed and pertinent positives documented in HPI   Past History   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)   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   Dysrhythmia    a-fib   GERD (gastroesophageal reflux disease)    Glaucoma    right eye   History of kidney stones    multiple   HLD (hyperlipidemia)    HTN (hypertension)    pt denies 08/19/12   Hyperplasia, prostate    Hypothyroidism    MI (myocardial infarction) (HCC)    Dr Pietro 2000, x3vessels bypass   Neuromuscular disorder (HCC)    Parkinson's Disease   OSA (obstructive sleep apnea)    AHI-28,on CPAP, noncompliant with CPAP   Parkinson disease (HCC)    1999   PONV (postoperative nausea and vomiting)  Restless legs    Shortness of breath    Hx: of at all times   UTI (lower urinary tract infection) 09/15/2012   Klebsiella   Ventral hernia    Walker as ambulation aid    also uses wheelchair at home/when going out    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   BIOPSY  10/14/2018   Procedure: BIOPSY;  Surgeon: Albertus Gordy HERO, MD;  Location: [facility];  Service: Gastroenterology;;   CATARACT EXTRACTION W/ INTRAOCULAR LENS IMPLANT     Hx: of right eye   CATARACT EXTRACTION W/ INTRAOCULAR LENS IMPLANT Left 2018   COLONOSCOPY N/A 10/13/2014   Procedure: COLONOSCOPY;  Surgeon: Gordy HERO Albertus, MD;  Location: [facility];  Service: Gastroenterology;  Laterality: N/A;   COLONOSCOPY W/ BIOPSIES AND POLYPECTOMY     Hx: of   COLONOSCOPY  WITH PROPOFOL  N/A 10/14/2018   Procedure: COLONOSCOPY WITH PROPOFOL ;  Surgeon: Albertus Gordy HERO, MD;  Location: [facility];  Service: Gastroenterology;  Laterality: N/A;   CORONARY ARTERY BYPASS GRAFT  2000   Sudie Laine, MD   CORONARY STENT PLACEMENT  1998   DEEP BRAIN STIMULATOR PLACEMENT  2004   Right and left VIN stimulator placement (parkinsons)   EYE SURGERY     Cataract removed in 1980s related to wood in eye when 73 years old   FINGER AMPUTATION     left pointer   IMPLANTABLE CARDIOVERTER DEFIBRILLATOR IMPLANT N/A 11/13/2013   Procedure: IMPLANTABLE CARDIOVERTER DEFIBRILLATOR IMPLANT;  Surgeon: Danelle LELON Birmingham, MD;  Location[facility];  Service: Cardiovascular;  Laterality: N/A;   INSERT / REPLACE / REMOVE PACEMAKER     medtronic   LEFT AND RIGHT HEART CATHETERIZATION WITH CORONARY ANGIOGRAM N/A 09/24/2013   Procedure: LEFT AND RIGHT HEART CATHETERIZATION WITH CORONARY ANGIOGRAM;  Surgeon: Lonni JONETTA Cash, MD;  Location: [facility] CATH LAB;  Service: Cardiovascular;  Laterality: N/A;   LITHOTRIPSY     3 different times   POLYPECTOMY  10/14/2018   Procedure: POLYPECTOMY;  Surgeon: Albertus Gordy HERO, MD;  Location: [facility];  Service: Gastroenterology;;   PULSE GENERATOR IMPLANT Right 11/13/2017   Procedure: Right chest implantable pulse generator change;  Surgeon: Unice Pac, MD;  Location: [facility] OR;  Service: Neurosurgery;  Laterality: Right;  Right chest implantable pulse generator change   SUBTHALAMIC STIMULATOR BATTERY REPLACEMENT N/A 09/05/2012   Procedure: Deep brain stimulator battery change;  Surgeon: Pac Unice, MD;  Location: [facility];  Service: Neurosurgery;  Laterality: N/A;  Deep brain stimulator battery change   SUBTHALAMIC STIMULATOR BATTERY REPLACEMENT N/A 06/10/2015   Procedure: Deep Brain stimulator battery change;  Surgeon: Pac Unice, MD;  Location: [facility];  Service: Neurosurgery;  Laterality: N/A;   SUBTHALAMIC STIMULATOR BATTERY REPLACEMENT  Right 09/25/2019   Procedure: Deep brain stimulator battery change;  Surgeon: Unice Pac, MD;  [facility];  Service: Neurosurgery;  Laterality: Right;   SUBTHALAMIC STIMULATOR BATTERY REPLACEMENT Right 12/15/2022   Procedure: IMPLANTABLE PULSE GENERATOR  BATTERY REPLACEMENT;  Surgeon: Carollee Lani BROCKS, DO;  Location: [facility] OR;  Service: Neurosurgery;  Laterality: Right;  3C   TONSILLECTOMY      Family History: Family History  Problem Relation Age of Onset   Aneurysm Mother    Alcoholism Father    HIV/AIDS Brother 76       AIDS   Healthy Sister    Healthy Child    Peripheral vascular  disease Other    Arthritis Other    Healthy Sister    Healthy Child    Healthy Child    Diabetes Neg Hx    Heart disease Neg Hx    Colon cancer Neg Hx    Prostate cancer Neg Hx     Social History  reports that he has never smoked. He has never been exposed to tobacco smoke. He has never used smokeless tobacco. He reports that he does not currently use alcohol. He reports that he does not use drugs.  Allergies  Allergen Reactions   Penicillins Anaphylaxis and Rash    Because of a history of documented adverse serious drug reaction;Medi Alert bracelet  is recommended PATIENT HAS HAD A PCN REACTION WITH IMMEDIATE RASH, FACIAL/TONGUE/THROAT SWELLING, SOB, OR LIGHTHEADEDNESS WITH HYPOTENSION:  #  #  YES  #  #  Has patient had a PCN reaction causing severe rash involving mucus membranes or skin necrosis: unknown Has patient had a PCN reaction that required hospitalization NO Has patient had a PCN reaction occurring within the last 10 years: NO   Klonopin  [Clonazepam ] Other (See Comments)    agitation   Peanut-Containing Drug Products Cough   Watermelon [Citrullus Vulgaris] Other (See Comments) and Cough    Tickle in throat    Medications   Current Facility-Administered Medications:    acetaminophen  (TYLENOL ) tablet 650 mg, 650 mg, Oral, Q6H PRN **OR** acetaminophen  (TYLENOL ) suppository 650 mg,  650 mg, Rectal, Q6H PRN, Perri DELENA Meliton Mickey., MD   albuterol  (PROVENTIL ) (2.5 MG/3ML) 0.083% nebulizer solution 2.5 mg, 2.5 mg, Nebulization, Q4H PRN, Perri DELENA Meliton Mickey., MD   allopurinol  (ZYLOPRIM ) tablet 150 mg, 150 mg, Oral, Daily, Perri DELENA Meliton Mickey., MD, 150 mg at 11/28/23 9062   aspirin  EC tablet 81 mg, 81 mg, Oral, Daily, Perri DELENA Meliton Mickey., MD, 81 mg at 11/28/23 9062   Carbidopa -Levodopa  ER (SINEMET  CR) 25-100 MG tablet controlled release 2 tablet, 2 tablet, Oral, 5 X Daily, Perri DELENA Meliton Mickey., MD, 2 tablet at 11/28/23 2243   cholecalciferol  (VITAMIN D3) 25 MCG (1000 UNIT) tablet 2,000 Units, 2,000 Units, Oral, Daily, Perri DELENA Meliton Mickey., MD, 2,000 Units at 11/28/23 1036   cyanocobalamin  (VITAMIN B12) tablet 1,000 mcg, 1,000 mcg, Oral, Daily, Perri DELENA Meliton Mickey., MD, 1,000 mcg at 11/28/23 0941   dorzolamide -timolol  (COSOPT ) 2-0.5 % ophthalmic solution 1 drop, 1 drop, Right Eye, BID, Perri DELENA Meliton Mickey., MD, 1 drop at 11/28/23 2244   DULoxetine  (CYMBALTA ) DR capsule 20 mg, 20 mg, Oral, Daily, Perri DELENA Meliton Mickey., MD   enoxaparin  (LOVENOX ) injection 50 mg, 50 mg, Subcutaneous, Q24H, Perri DELENA Meliton Mickey., MD, 50 mg at 11/28/23 2244   fluticasone  furoate-vilanterol (BREO ELLIPTA ) 200-25 MCG/ACT 1 puff, 1 puff, Inhalation, Daily, Perri DELENA Meliton Mickey., MD, 1 puff at 11/28/23 1744   icosapent  Ethyl (VASCEPA ) 1 g capsule 2 g, 2 g, Oral, BID, Perri DELENA Meliton Mickey., MD, 2 g at 11/28/23 2245   insulin  aspart (novoLOG ) injection 0-5 Units, 0-5 Units, Subcutaneous, QHS, Powell, A Meliton Mickey., MD   insulin  aspart (novoLOG ) injection 0-9 Units, 0-9 Units, Subcutaneous, TID WC, Perri DELENA Meliton Mickey., MD, 1 Units at 11/28/23 1415   insulin  aspart (novoLOG ) injection 4 Units, 4 Units, Subcutaneous, TID WC, Perri DELENA Meliton Mickey., MD, 4 Units at 11/28/23 1414   insulin  glargine-yfgn (SEMGLEE ) injection 15 Units, 15 Units, Subcutaneous, QHS, Perri DELENA Meliton Mickey., MD, 15  Units at 11/28/23 2244   levothyroxine  (  SYNTHROID ) tablet 150 mcg, 150 mcg, Oral, Q0600, Perri DELENA Meliton Mickey., MD, 150 mcg at 11/28/23 0517   melatonin tablet 3 mg, 3 mg, Oral, QHS PRN, Opyd, Timothy S, MD, 3 mg at 11/28/23 0008   mirabegron  ER (MYRBETRIQ ) tablet 25 mg, 25 mg, Oral, Daily, Perri DELENA Meliton Mickey., MD, 25 mg at 11/28/23 0941   Pimavanserin Tartrate  CAPS 34 mg, 34 mg, Oral, Daily, Perri DELENA Meliton Mickey., MD   polyethylene glycol (MIRALAX / GLYCOLAX) packet 8.5 g, 8.5 g, Oral, Daily, Perri DELENA Meliton Mickey., MD, 8.5 g at 11/28/23 0941   rosuvastatin  (CRESTOR ) tablet 20 mg, 20 mg, Oral, Daily, Perri DELENA Meliton Mickey., MD, 20 mg at 11/28/23 2245  Vitals   Vitals:   11/28/23 1711 11/28/23 1930 11/28/23 2021 11/29/23 0032  BP:  127/66 117/73 (!) 114/57  Pulse:  72 75 66  Resp:  18 16 15   Temp: 97.6 F (36.4 C)  98.4 F (36.9 C) 98.7 F (37.1 C)  TempSrc: Oral  Oral Axillary  SpO2:  95% 92% 94%  Weight:      Height:        Body mass index is 38.1 kg/m.   Physical Exam   General: Laying comfortably in bed; in no acute distress.  HENT: Normal oropharynx and mucosa. Normal external appearance of ears and nose.  Neck: Supple, no pain or tenderness  CV: No JVD. No peripheral edema.  Pulmonary: Symmetric Chest rise. Normal respiratory effort.  Abdomen: Soft to touch, non-tender.  Ext: No cyanosis, edema, or deformity  Skin: No rash. Normal palpation of skin.   Musculoskeletal: Normal digits and nails by inspection. No clubbing.   Neurologic Examination  Mental status/Cognition: Alert, oriented to self, place, month and year, good attention.  Speech/language: Fluent, comprehension intact, object naming intact, repetition intact.  Cranial nerves:   CN II Pupils equal and reactive to light, no VF deficits    CN III,IV,VI EOM intact, no gaze preference or deviation, no nystagmus    CN V normal sensation in V1, V2, and V3 segments bilaterally    CN VII no asymmetry, no  nasolabial fold flattening    CN VIII normal hearing to speech    CN IX & X normal palatal elevation, no uvular deviation    CN XI 5/5 head turn and 5/5 shoulder shrug bilaterally    CN XII midline tongue protrusion    Motor:  Muscle bulk: normal, tone cogwheel rigidit noted, tremor: restng tremor concerning for parkinsons Mvmt Root Nerve  Muscle Right Left Comments  SA C5/6 Ax Deltoid 5 5   EF C5/6 Mc Biceps 5 5   EE C6/7/8 Rad Triceps 5 5   WF C6/7 Med FCR     WE C7/8 PIN ECU     F Ab C8/T1 U ADM/FDI 5 5   HF L1/2/3 Fem Illopsoas 5 5   KE L2/3/4 Fem Quad 5 5   DF L4/5 D Peron Tib Ant 5 5   PF S1/2 Tibial Grc/Sol 5 5    Sensation:  Light touch Intact throughout   Pin prick    Temperature    Vibration   Proprioception    Coordination/Complex Motor:  - Finger to Nose intact BL - Heel to shin intact BL - Rapid alternating movement are slowed - Gait: deferred.   Labs/Imaging/Neurodiagnostic studies   CBC:  Recent Labs  Lab 2023-12-02 1545 12-02-23 2157 11/28/23 0305  WBC 6.6 7.0 5.8  NEUTROABS 3.9  --   --  HGB 15.3 14.7 13.9  HCT 47.4 45.4 42.1  MCV 89.4 89.2 89.0  PLT 144* 146* 132*   Basic Metabolic Panel:  Lab Results  Component Value Date   NA 139 11/28/2023   K 3.7 11/28/2023   CO2 28 11/28/2023   GLUCOSE 99 11/28/2023   BUN 28 (H) 11/28/2023   CREATININE 1.43 (H) 11/28/2023   CALCIUM  8.7 (L) 11/28/2023   GFRNONAA 52 (L) 11/28/2023   GFRAA >60 09/25/2019   Lipid Panel:  Lab Results  Component Value Date   LDLCALC 45 04/06/2023   HgbA1c:  Lab Results  Component Value Date   HGBA1C 6.2 (A) 11/14/2023   Urine Drug Screen:     Component Value Date/Time   LABOPIA NONE DETECTED 11/27/2023 1538   COCAINSCRNUR NONE DETECTED 11/27/2023 1538   LABBENZ NONE DETECTED 11/27/2023 1538   AMPHETMU NONE DETECTED 11/27/2023 1538   THCU NONE DETECTED 11/27/2023 1538   LABBARB NONE DETECTED 11/27/2023 1538    Alcohol Level     Component Value  Date/Time   ETH <15 11/27/2023 1545   INR  Lab Results  Component Value Date   INR 1.0 11/27/2023   APTT  Lab Results  Component Value Date   APTT 37 (H) 11/27/2023   AED levels: No results found for: PHENYTOIN, ZONISAMIDE, LAMOTRIGINE, LEVETIRACETA  CT Head without contrast 11/29/23(Personally reviewed): CTH was negative for a large hypodensity concerning for a large territory infarct or hyperdensity concerning for an ICH  Neurodiagnostics rEEG:  This study is suggestive of mild generalized cerebral dysfunction (encephalopathy). No seizures or epileptiform discharges were seen throughout the recording.   ASSESSMENT   CHESNEY KLIMASZEWSKI is a 73 y.o. male with hx of afibb not on AC due to falls, Parkinsons disease on Siemet and s/p DBS in early 200s, hx of HTN, GERD, DM2, deaf, MI, OSA not compliant with CPAP who presents with difficulty walking, swelling in the R foot and BL hand.  Saw outpatient neurologist Dr. Evonnie last week for R face and tongue drawing up and DBS was drastically turned out and while there, had an episode of starring off and poorly responsive for which a CT Head was obtained and was negative. Over the weekend, he developed swelling in BL feet and urinating more often and now in depends and on Tuesday, while getting ready for EEG appointment, he had a event where he was poorly responsive and starring off and since then, has been leaning to his right.  CT head in the ED on the day of the event was negative. He is unable to get MRI Brain.  Etiology of his spells of poor responsiveness and starring off unclear, coule be behavioral arrest in the setting of dementia and parkinsonisn but could also be seizures. Since these have been occurring more frequently, will get him up on cEEG. He has also been leaning more to his right thou no obvious R sided weakness noted on my exam. CT Head can be insensitive for small strokes early on and since he had the CT on the day of  the noted R sided lean, will get a repeat CT Head to evaluate for any stroke. He does have significant risk factors including afibb and not on AC due to falls. He is unable to get MRI Brain.  RECOMMENDATIONS  - recommend repeat CT Head since CT can be insensitive early on and may take time to show strokes. - cEEG overnight to attempt to capture spells of behavioral arrest. -  continue sinemet  ______________________________________________________________________  Plan discussed with patient and daughter at the bedside.  I personally spent a total of 75 minutes in the care of the patient today including preparing to see the patient, getting/reviewing separately obtained history, performing a medically appropriate exam/evaluation, counseling and educating, placing orders, documenting clinical information in the EHR, independently interpreting results, communicating results, and coordinating care.   Signed, Kalden Wanke, MD [facility]

## 2023-11-30 ENCOUNTER — Inpatient Hospital Stay (HOSPITAL_COMMUNITY)

## 2023-11-30 DIAGNOSIS — R531 Weakness: Secondary | ICD-10-CM | POA: Diagnosis not present

## 2023-11-30 DIAGNOSIS — I959 Hypotension, unspecified: Secondary | ICD-10-CM

## 2023-11-30 DIAGNOSIS — R569 Unspecified convulsions: Secondary | ICD-10-CM | POA: Diagnosis not present

## 2023-11-30 DIAGNOSIS — G20A2 Parkinson's disease without dyskinesia, with fluctuations: Secondary | ICD-10-CM | POA: Diagnosis not present

## 2023-11-30 LAB — BASIC METABOLIC PANEL WITH GFR
Anion gap: 9 (ref 5–15)
BUN: 23 mg/dL (ref 8–23)
CO2: 30 mmol/L (ref 22–32)
Calcium: 8.7 mg/dL — ABNORMAL LOW (ref 8.9–10.3)
Chloride: 97 mmol/L — ABNORMAL LOW (ref 98–111)
Creatinine, Ser: 1.3 mg/dL — ABNORMAL HIGH (ref 0.61–1.24)
GFR, Estimated: 58 mL/min — ABNORMAL LOW (ref >60)
Glucose, Bld: 124 mg/dL — ABNORMAL HIGH (ref 70–99)
Potassium: 3.7 mmol/L (ref 3.5–5.1)
Sodium: 136 mmol/L (ref 135–145)

## 2023-11-30 LAB — GLUCOSE, CAPILLARY
Glucose-Capillary: 109 mg/dL — ABNORMAL HIGH (ref 70–99)
Glucose-Capillary: 119 mg/dL — ABNORMAL HIGH (ref 70–99)
Glucose-Capillary: 132 mg/dL — ABNORMAL HIGH (ref 70–99)
Glucose-Capillary: 118 mg/dL — ABNORMAL HIGH (ref 70–99)

## 2023-11-30 NOTE — Plan of Care (Signed)
  Problem: Education: Goal: Knowledge of secondary prevention will improve (MUST DOCUMENT ALL) Outcome: Progressing   Problem: Education: Goal: Knowledge of patient specific risk factors will improve (DELETE if not current risk factor) Outcome: Progressing   Problem: Education: Goal: Knowledge of disease or condition will improve Outcome: Progressing   Problem: Coping: Goal: Level of anxiety will decrease Outcome: Progressing   Problem: Nutrition: Goal: Adequate nutrition will be maintained Outcome: Progressing

## 2023-11-30 NOTE — Progress Notes (Signed)
 [facility]  PROGRESS NOTE  Henry [name] FMW:[id number] DOB: 01/09/1951 DOA: 11/27/2023 PCP: Henry Arlyss RAMAN, MD   Brief HPI:    73 y.o. male with medical history significant of parkinson's disease s/p DBS, HFrEF, HTN, T2DM and multiple other medical issues here with generalized weakness.  Henry [name] notes his symptoms started 2-3 weeks ago. He has a longstanding history of parkinson's. Got a DBS placed in early 2000's. Typically able to walk with a walker, feed himself. He's noticed more freezing episodes in the past 2-3 weeks. More mobility issues. He saw his neurologist on 11/13 and at that time was noted to have right facial pulling. His DBS setting was changed at neurologist office. Symptoms became worse after change of setting of DBS.  Case was discussed with neurology who recommended admission and EEG/MRI and formal consult if concerns.     Assessment/Plan:   Generalized weakness in  setting of Parkinson disease - Usually able to walk with walker at baseline;  - Started having worsening freezing spells with progressive weakness - Was seen by neurology, at that time DBS turned off-device amplitude was turned down as a result - Patient symptoms got worse after that - CT head showed no acute abnormality - MRI brain not obtained as not compatible with patient's device - Neurology recommended EEG; which did not show up after home discharges -Continue Sinemet  PT evaluation obtained  ?  Seizure - Appreciate neurology input - EEG was unremarkable - Continuous EEG monitoring started, however no epileptic spells noted - Had 1 nonepileptic spell yesterday  Hypotension/?  Presyncope Blood pressure is improved after holding BP meds -check orthostatic vital signs - Patient's symptoms have improved after stopping his medications, doubt that patient mental status was from orthostatic hypotension/presyncope   Chronic systolic heart failure -Echocardiogram from 01/2042 showed  EF of 30 to 35%, grade 2 diastolic 2 diastolic dysfunction - BNP was 11.4 - Hold Lasix  at this time due to worsening renal function and hypotension - BP meds on hold as above Continue to hold lasix , spironolactone , losartan , metoprolol   Right Lower Extremity Swelling -Venous ultrasound shows no DVT   T2DM On insulin  pump at home Transition to basal/bolus and SSI while here - conservative dosing - adjust as needed CBG well-controlled    Dyslipidemia Crestor   Asthma Continue breathing treatments   Glaucoma Cosopt    Gout allopurinol        DVT prophylaxis: Lovenox   Medications     allopurinol   150 mg Oral Daily   aspirin  EC  81 mg Oral Daily   Carbidopa -Levodopa  ER  2 tablet Oral 5 X Daily   cholecalciferol   2,000 Units Oral Daily   cyanocobalamin   1,000 mcg Oral Daily   dorzolamide -timolol   1 drop Right Eye BID   DULoxetine   20 mg Oral Daily   enoxaparin  (LOVENOX ) injection  50 mg Subcutaneous Q24H   fluticasone  furoate-vilanterol  1 puff Inhalation Daily   icosapent  Ethyl  2 g Oral BID   insulin  aspart  0-5 Units Subcutaneous QHS   insulin  aspart  0-9 Units Subcutaneous TID WC   insulin  aspart  4 Units Subcutaneous TID WC   insulin  glargine-yfgn  15 Units Subcutaneous QHS   levothyroxine   150 mcg Oral Q0600   mirabegron  ER  25 mg Oral Daily   Pimavanserin  Tartrate  34 mg Oral Daily   polyethylene glycol  8.5 g Oral Daily   rosuvastatin   20 mg Oral Daily     Data Reviewed:   CBG:  Recent Labs  Lab 11/29/23 0607 11/29/23 1118 11/29/23 1627 11/29/23 2112 11/30/23 0614  GLUCAP 106* 134* 124* 155* 109*    SpO2: 91 %    Vitals:   11/30/23 0037 11/30/23 0519 11/30/23 0831 11/30/23 0858  BP: 120/83 (!) 140/125 114/66   Pulse: 66 72 64   Resp: 18 19 18    Temp: 98.5 F (36.9 C) 97.7 F (36.5 C) 97.9 F (36.6 C)   TempSrc: Oral  Oral   SpO2: 94% 91% 93% 91%  Weight:      Height:          Data Reviewed:  Basic Metabolic Panel: Recent  Labs  Lab 11/27/23 1545 11/27/23 2157 11/28/23 0305 11/29/23 0404  NA 138  --  139 136  K 4.7  --  3.7 3.8  CL 96*  --  99 96*  CO2 27  --  28 27  GLUCOSE 96  --  99 101*  BUN 28*  --  28* 25*  CREATININE 1.15 1.41* 1.43* 1.42*  CALCIUM  8.9  --  8.7* 8.8*    CBC: Recent Labs  Lab 11/27/23 1545 11/27/23 2157 11/28/23 0305 11/29/23 0404  WBC 6.6 7.0 5.8 5.4  NEUTROABS 3.9  --   --   --   HGB 15.3 14.7 13.9 13.8  HCT 47.4 45.4 42.1 42.0  MCV 89.4 89.2 89.0 87.7  PLT 144* 146* 132* 132*    LFT Recent Labs  Lab 11/27/23 1545 11/28/23 0305 11/29/23 0404  AST 21 <10* <10*  ALT 5 6 <5  ALKPHOS 66 59 61  BILITOT 1.3* 1.0 1.1  PROT 6.5 6.4* 6.2*  ALBUMIN  3.9 3.7 3.5     Antibiotics: Anti-infectives (From admission, onward)    None        CONSULTS   Code Status: DNR  Family Communication: Discussed with patient's daughter and son at bedside     Subjective   Patient's mental status has significantly improved.  Blood pressure has improved after stopping antihypertensive medications.   Objective    Physical Examination:  Appears in no acute distress, communicating well. S1-S2, regular Lungs are clear to auscultation bilaterally Abdomen is soft, nontender, no organomegaly Extremities no edema  Status is: Inpatient:             Henry [name]   [facility] If 7PM-7AM, please contact night-coverage at [email or web address], Office  [phone]   11/30/2023, 11:23 AM  LOS: 2 days

## 2023-11-30 NOTE — Procedures (Signed)
 Patient Name: Henry [name]  MRN: [id number]  Epilepsy Attending: Arlin MALVA Krebs  Referring Physician/Provider: Khaliqdina, Salman, MD  Duration: 11/29/2023 1116 to 11/30/2023 1116  Patient history: 73yo M with poor responsiveness and starring off unclear, coule be behavioral arrest. EEG to evaluate for seizure   Level of alertness: Awake, asleep  AEDs during EEG study: None  Technical aspects: This EEG study was done with scalp electrodes positioned according to the 10-20 International system of electrode placement. Electrical activity was reviewed with band pass filter of 1-70Hz , sensitivity of 7 uV/mm, display speed of 1mm/sec with a 60Hz  notched filter applied as appropriate. EEG data were recorded continuously and digitally stored.  Video monitoring was available and reviewed as appropriate.  Description: The posterior dominant rhythm consists of 8-9 Hz activity of moderate voltage (25-35 uV) seen predominantly in posterior head regions, symmetric and reactive to eye opening and eye closing. Sleep was characterized by vertex waves, sleep spindles (12 to 14 Hz), maximal frontocentral region.    Event button was pressed on 11/29/2023 at 1207. Patient was staring and  not responding.Concomitant EEG before, during and after the event showed normal posterior dominant rhythm and did not show any EEG changes suggest seizure.   Hyperventilation and photic stimulation were not performed.      IMPRESSION: This study is within normal limits. No seizures or epileptiform discharges were seen throughout the recording.  Event button was pressed on 11/29/2023 at 1207 for staring without concomitant eeg change. This was a NON epileptic event  A normal interictal EEG does not exclude the diagnosis of epilepsy.   Dorsel Flinn O Doroteo Nickolson

## 2023-11-30 NOTE — Progress Notes (Signed)
 Physical Therapy Treatment Patient Details Name: Henry [name] MRN: [id number] DOB: 06-18-1950 Today's Date: 11/30/2023   History of Present Illness 73 y.o. male presents to [facility] hospital on 11/27/2023 with generalized weakness. Pt currenlty on continuous EEG. PMH includes Parkinson's disease s/p deep brain stimulator, HFrEF, HTN, DMII, PAF, dysphagia, R foot dystonia s/p botox  injections.    PT Comments  Pt received in supine and agreeable to session. Pt requires grossly min A for bed mobility, transfers, and short gait trial this session. Pt requests to use BSC, but is unsuccessful. Pt able to progress gait trial this session, but distance limited by EEG and fatigue. Pt demonstrates increased flexed posture and freezing with fatigue. Pt continues to benefit from PT services to progress toward functional mobility goals.    If plan is discharge home, recommend the following: A lot of help with walking and/or transfers;A lot of help with bathing/dressing/bathroom;Assistance with cooking/housework;Assist for transportation;Help with stairs or ramp for entrance   Can travel by private vehicle        Equipment Recommendations  None recommended by PT    Recommendations for Other Services       Precautions / Restrictions Precautions Precautions: Fall Recall of Precautions/Restrictions: Impaired Restrictions Weight Bearing Restrictions Per Provider Order: No     Mobility  Bed Mobility Overal bed mobility: Needs Assistance Bed Mobility: Supine to Sit, Sit to Supine     Supine to sit: Min assist, HOB elevated Sit to supine: Min assist   General bed mobility comments: Assist for BLE management and scooting forward to EOB. Cues for sequencing    Transfers Overall transfer level: Needs assistance Equipment used: Rolling walker (2 wheels) Transfers: Sit to/from Stand, Bed to chair/wheelchair/BSC Sit to Stand: Min assist   Step pivot transfers: Min assist       General transfer  comment: STS from EOB and BSC with min A and cues for anterior weight shift. Pivot with assist for RW management and stability    Ambulation/Gait Ambulation/Gait assistance: Min assist Gait Distance (Feet): 40 Feet Assistive device: Rolling walker (2 wheels) Gait Pattern/deviations: Shuffle, Trunk flexed, Decreased stride length, Step-through pattern Gait velocity: reduced     General Gait Details: slow gait with cues for safety and awareness. Pt demonstrates increasing flexed posture with fatigue despite multimodal cues. Cues for RW proximity and increased step length. Freezing and festination noted at end of trial with fatigue   Stairs             Wheelchair Mobility     Tilt Bed    Modified Rankin (Stroke Patients Only)       Balance Overall balance assessment: Needs assistance Sitting-balance support: Feet supported, No upper extremity supported Sitting balance-Leahy Scale: Fair Sitting balance - Comments: sitting EOB   Standing balance support: Bilateral upper extremity supported, Reliant on assistive device for balance, During functional activity Standing balance-Leahy Scale: Poor Standing balance comment: Reliant on RW and external support                            Communication Communication Communication: Impaired Factors Affecting Communication: Reduced clarity of speech;Hearing impaired  Cognition Arousal: Alert Behavior During Therapy: Flat affect   PT - Cognitive impairments: Difficult to assess, Problem solving                       PT - Cognition Comments: slowed processing Following commands: Impaired Following commands impaired: Follows  one step commands with increased time    Cueing Cueing Techniques: Verbal cues, Tactile cues  Exercises      General Comments        Pertinent Vitals/Pain Pain Assessment Pain Assessment: No/denies pain     PT Goals (current goals can now be found in the care plan section)  Acute Rehab PT Goals Patient Stated Goal: to return to baseline, ambulating for household distances with use of RW or U-walker PT Goal Formulation: With patient/family Time For Goal Achievement: 12/12/23 Progress towards PT goals: Progressing toward goals    Frequency    Min 2X/week       AM-PAC PT 6 Clicks Mobility   Outcome Measure  Help needed turning from your back to your side while in a flat bed without using bedrails?: A Little Help needed moving from lying on your back to sitting on the side of a flat bed without using bedrails?: A Little Help needed moving to and from a bed to a chair (including a wheelchair)?: A Little Help needed standing up from a chair using your arms (e.g., wheelchair or bedside chair)?: A Little Help needed to walk in hospital room?: A Little Help needed climbing 3-5 steps with a railing? : Total 6 Click Score: 16    End of Session Equipment Utilized During Treatment: Gait belt Activity Tolerance: Patient tolerated treatment well Patient left: in bed;with call bell/phone within reach;with bed alarm set Nurse Communication: Mobility status PT Visit Diagnosis: Other abnormalities of gait and mobility (R26.89);Muscle weakness (generalized) (M62.81);Other symptoms and signs involving the nervous system (R29.898)     Time: 8563-8494 PT Time Calculation (min) (ACUTE ONLY): 29 min  Charges:    $Gait Training: 8-22 mins $Therapeutic Activity: 8-22 mins PT General Charges $$ ACUTE PT VISIT: 1 Visit                    Darryle George, PTA Acute Rehabilitation Services Secure Chat Preferred  [phone]    Darryle George 11/30/2023, 3:23 PM

## 2023-11-30 NOTE — Progress Notes (Signed)
 Occupational Therapy Treatment Patient Details Name: Henry [name] MRN: [id number] DOB: Feb 06, 1950 Today's Date: 11/30/2023   History of present illness 73 y.o. male presents to [facility] hospital on 11/27/2023 with generalized weakness. Pt currenlty on continuous EEG. PMH includes Parkinson's disease s/p deep brain stimulator, HFrEF, HTN, DMII, PAF, dysphagia, R foot dystonia s/p botox  injections.   OT comments  Pt making slow but steady progress with adls and functional mobility.  Pt completed many reps of sit to stand with walker in prep for LE dressing and toileting tasks. Pt tolerated increased activity and was able to do all mobility with +1 assist. If pt is to ambulate further, feel +2 is necessary.  Do not feel pt is at his baseline yet with adls and functional mobility so continue to recommend greater than 3 hours of therapy a day prior to going home.  Feel he can achieve +1 min assist with most adls/functional mobility if pt can get intensive rehab. Will continue to see and hope to mobilize more once off continuous EEG.        If plan is discharge home, recommend the following:  Two people to help with walking and/or transfers;A lot of help with bathing/dressing/bathroom;Assistance with cooking/housework;Assistance with feeding;Direct supervision/assist for medications management;Direct supervision/assist for financial management;Assist for transportation;Help with stairs or ramp for entrance   Equipment Recommendations  None recommended by OT    Recommendations for Other Services      Precautions / Restrictions Precautions Precautions: Fall Recall of Precautions/Restrictions: Impaired Restrictions Weight Bearing Restrictions Per Provider Order: No       Mobility Bed Mobility Overal bed mobility: Needs Assistance Bed Mobility: Supine to Sit, Sit to Supine     Supine to sit: Min assist, HOB elevated Sit to supine: Min assist   General bed mobility comments: Pt reqiured  assist to get into a full sitting position and to get legs back into bed after sitting on EOB    Transfers Overall transfer level: Needs assistance Equipment used: Rolling walker (2 wheels) Transfers: Sit to/from Stand, Bed to chair/wheelchair/BSC Sit to Stand: Min assist     Step pivot transfers: Min assist, +2 physical assistance     General transfer comment: assist to guide walker during step pivot as pt often continues to move walker while feet are freezing, leading to anterior or lateral lean. Short shuffling steps when transferring     Balance Overall balance assessment: Needs assistance Sitting-balance support: Feet supported, No upper extremity supported Sitting balance-Leahy Scale: Fair Sitting balance - Comments: no outside support given; just supervision   Standing balance support: Bilateral upper extremity supported, Reliant on assistive device for balance Standing balance-Leahy Scale: Poor Standing balance comment: must have outside support                           ADL either performed or assessed with clinical judgement   ADL Overall ADL's : Needs assistance/impaired                     Lower Body Dressing: Maximal assistance Lower Body Dressing Details (indicate cue type and reason): pt requires assist to get started with clothing over feet but increased abililty to transfer sit to stand this am.  Worked on letting go of walker with one hand to pull up pants with other hand in standing. Toilet Transfer: Minimal assistance;Stand-pivot;Rolling walker (2 wheels);BSC/3in1 Toilet Transfer Details (indicate cue type and reason): pivot to [facility] Toileting-  Clothing Manipulation and Hygiene: Maximal assistance;Sit to/from stand;Cueing for compensatory techniques Toileting - Clothing Manipulation Details (indicate cue type and reason): pt working on pulling up pants with one hand while letting go with the other.     Functional mobility during ADLs:  Minimal assistance;+2 for physical assistance;Rolling walker (2 wheels) General ADL Comments: for anything more than transfer, feet two people is helpful to ambulate.    Extremity/Trunk Assessment              Vision       Perception     Praxis     Communication Communication Communication: Impaired Factors Affecting Communication: Reduced clarity of speech;Hearing impaired   Cognition Arousal: Alert Behavior During Therapy: Flat affect Cognition: History of cognitive impairments             OT - Cognition Comments: likely at [place] per wife                 Following commands: Impaired Following commands impaired: Follows one step commands with increased time      Cueing   Cueing Techniques: Verbal cues, Tactile cues  Exercises      Shoulder Instructions       General Comments Pt tolerated sit to stand x4 today with increased independence each time but continues to have freezing moments when side stepping and transferring.  Limited to distance of continuous EEG today.    Pertinent Vitals/ Pain       Pain Assessment Pain Assessment: No/denies pain  Home Living                                          Prior Functioning/Environment              Frequency  Min 2X/week        Progress Toward Goals  OT Goals(current goals can now be found in the care plan section)  Progress towards OT goals: Progressing toward goals  Acute Rehab OT Goals OT Goal Formulation: With patient/family Time For Goal Achievement: 12/12/23 Potential to Achieve Goals: Good ADL Goals Pt Will Perform Eating: with set-up;sitting Pt Will Perform Grooming: with set-up;sitting Pt Will Perform Upper Body Bathing: with min assist;sitting Pt Will Perform Upper Body Dressing: with min assist;sitting Pt Will Transfer to Toilet: with min assist;ambulating;bedside commode  Plan      Co-evaluation                 AM-PAC OT 6 Clicks Daily  Activity     Outcome Measure   Help from another person eating meals?: A Lot Help from another person taking care of personal grooming?: Total Help from another person toileting, which includes using toliet, bedpan, or urinal?: Total Help from another person bathing (including washing, rinsing, drying)?: A Lot Help from another person to put on and taking off regular upper body clothing?: A Lot Help from another person to put on and taking off regular lower body clothing?: A Lot 6 Click Score: 10    End of Session Equipment Utilized During Treatment: Gait belt;Rolling walker (2 wheels)  OT Visit Diagnosis: Unsteadiness on feet (R26.81);Other abnormalities of gait and mobility (R26.89);Muscle weakness (generalized) (M62.81);Other symptoms and signs involving cognitive function   Activity Tolerance Patient tolerated treatment well   Patient Left in bed;with call bell/phone within reach;with family/visitor present   Nurse Communication Mobility status  Time: 8941-8876 OT Time Calculation (min): 25 min  Charges: OT General Charges $OT Visit: 1 Visit OT Treatments $Self Care/Home Management : 23-37 mins   Joshua Silvano Dragon 11/30/2023, 11:36 AM

## 2023-11-30 NOTE — Progress Notes (Signed)
 vLTM maintenance  All impedances below 10k.  No skin breakdown noted at FP1  FP1  F4 C4

## 2023-11-30 NOTE — Plan of Care (Signed)

## 2023-11-30 NOTE — Progress Notes (Signed)
 NEUROLOGY CONSULT FOLLOW UP NOTE   Date of service: November 30, 2023 Patient Name: Henry [name] MRN:  [id number] DOB:  05-10-1950  Interval Hx/subjective   Family at bedside.  Patient with improved mental status. Continues to lean to right, intermittent slow responses.   Vitals   Vitals:   11/30/23 0519 11/30/23 0831 11/30/23 0858 11/30/23 1142  BP: (!) 140/125 114/66  110/60  Pulse: 72 64  70  Resp: 19 18  18   Temp: 97.7 F (36.5 C) 97.9 F (36.6 C)  97.7 F (36.5 C)  TempSrc:  Oral  Oral  SpO2: 91% 93% 91% 94%  Weight:      Height:         Body mass index is 38.1 kg/m.  Physical Exam   Constitutional: Appears well-developed and well-nourished.  Cardiovascular: Normal rate and regular rhythm. No peripheral edema.  Respiratory: Effort normal, non-labored breathing. Intermittent expiratory wheezing. Reported dyspnea on exertion by family.   Neurologic Examination    Neuro: Mental Status: Patient is awake, alert, oriented to person, place, month, year, and situation. Patient is able to give a clear and coherent history. No signs of aphasia or neglect. Does have some slow responses Cranial Nerves: II: Visual Fields are full. Pupils are equal, round, and reactive to light.   III,IV, VI: EOMI without ptosis or diploplia.  V: Facial sensation is symmetric to light touch VII: Right nasolabial fold flattening.  VIII: hearing is intact to voice X: Uvula elevates symmetrically XI: Shoulder shrug is symmetric. XII: tongue is midline without atrophy or fasciculations.  Motor: Tone is normal. Bulk is normal. 5/5 strength was present in all four extremities.  Rigidity and tremor noted Sensory: Sensation is symmetric to light touch in the arms and legs. Cerebellar: FNF and HKS are slow, but intact bilaterally  Medications  Current Facility-Administered Medications:    acetaminophen  (TYLENOL ) tablet 650 mg, 650 mg, Oral, Q6H PRN, 650 mg at 11/30/23 0501 **OR**  acetaminophen  (TYLENOL ) suppository 650 mg, 650 mg, Rectal, Q6H PRN, Perri DELENA Meliton Mickey., MD   albuterol  (PROVENTIL ) (2.5 MG/3ML) 0.083% nebulizer solution 2.5 mg, 2.5 mg, Nebulization, Q4H PRN, Perri DELENA Meliton Mickey., MD   allopurinol  (ZYLOPRIM ) tablet 150 mg, 150 mg, Oral, Daily, Perri DELENA Meliton Mickey., MD, 150 mg at 11/30/23 1051   aspirin  EC tablet 81 mg, 81 mg, Oral, Daily, Perri DELENA Meliton Mickey., MD, 81 mg at 11/30/23 1052   Carbidopa -Levodopa  ER (SINEMET  CR) 25-100 MG tablet controlled release 2 tablet, 2 tablet, Oral, 5 X Daily, Perri DELENA Meliton Mickey., MD, 2 tablet at 11/30/23 1052   cholecalciferol  (VITAMIN D3) 25 MCG (1000 UNIT) tablet 2,000 Units, 2,000 Units, Oral, Daily, Perri DELENA Meliton Mickey., MD, 2,000 Units at 11/30/23 1052   cyanocobalamin  (VITAMIN B12) tablet 1,000 mcg, 1,000 mcg, Oral, Daily, Perri DELENA Meliton Mickey., MD, 1,000 mcg at 11/30/23 1052   dorzolamide -timolol  (COSOPT ) 2-0.5 % ophthalmic solution 1 drop, 1 drop, Right Eye, BID, Perri DELENA Meliton Mickey., MD, 1 drop at 11/30/23 1058   DULoxetine  (CYMBALTA ) DR capsule 20 mg, 20 mg, Oral, Daily, Perri DELENA Meliton Mickey., MD, 20 mg at 11/30/23 1052   enoxaparin  (LOVENOX ) injection 50 mg, 50 mg, Subcutaneous, Q24H, Perri DELENA Meliton Mickey., MD, 50 mg at 11/29/23 2214   fluticasone  furoate-vilanterol (BREO ELLIPTA ) 200-25 MCG/ACT 1 puff, 1 puff, Inhalation, Daily, Perri DELENA Meliton Mickey., MD, 1 puff at 11/30/23 0857   icosapent  Ethyl (VASCEPA ) 1 g capsule 2 g, 2 g, Oral, BID, Perri,  A Meliton Raddle., MD, 2 g at 11/30/23 1052   insulin  aspart (novoLOG ) injection 0-5 Units, 0-5 Units, Subcutaneous, QHS, Powell, A Meliton Raddle., MD   insulin  aspart (novoLOG ) injection 0-9 Units, 0-9 Units, Subcutaneous, TID WC, Perri DELENA Meliton Raddle., MD, 1 Units at 11/29/23 1726   insulin  aspart (novoLOG ) injection 4 Units, 4 Units, Subcutaneous, TID WC, Perri DELENA Meliton Raddle., MD, 4 Units at 11/30/23 1230   insulin  glargine-yfgn (SEMGLEE ) injection 15  Units, 15 Units, Subcutaneous, QHS, Perri DELENA Meliton Raddle., MD, 15 Units at 11/29/23 2212   levothyroxine  (SYNTHROID ) tablet 150 mcg, 150 mcg, Oral, Q0600, Perri DELENA Meliton Raddle., MD, 150 mcg at 11/30/23 0501   melatonin tablet 3 mg, 3 mg, Oral, QHS PRN, Opyd, Timothy S, MD, 3 mg at 11/28/23 0008   mirabegron  ER (MYRBETRIQ ) tablet 25 mg, 25 mg, Oral, Daily, Perri DELENA Meliton Raddle., MD, 25 mg at 11/30/23 1052   Pimavanserin  Tartrate CAPS 34 mg, 34 mg, Oral, Daily, Perri DELENA Meliton Raddle., MD, 34 mg at 11/30/23 1058   polyethylene glycol (MIRALAX  / GLYCOLAX ) packet 8.5 g, 8.5 g, Oral, Daily, Perri DELENA Meliton Raddle., MD, 8.5 g at 11/30/23 1053   rosuvastatin  (CRESTOR ) tablet 20 mg, 20 mg, Oral, Daily, Perri DELENA Meliton Raddle., MD, 20 mg at 11/29/23 2214  Labs and Diagnostic Imaging   CBC:  Recent Labs  Lab 11/27/23 1545 11/27/23 2157 11/28/23 0305 11/29/23 0404  WBC 6.6   < > 5.8 5.4  NEUTROABS 3.9  --   --   --   HGB 15.3   < > 13.9 13.8  HCT 47.4   < > 42.1 42.0  MCV 89.4   < > 89.0 87.7  PLT 144*   < > 132* 132*   < > = values in this interval not displayed.    Basic Metabolic Panel:  Lab Results  Component Value Date   NA 136 11/29/2023   K 3.8 11/29/2023   CO2 27 11/29/2023   GLUCOSE 101 (H) 11/29/2023   BUN 25 (H) 11/29/2023   CREATININE 1.42 (H) 11/29/2023   CALCIUM  8.8 (L) 11/29/2023   GFRNONAA 53 (L) 11/29/2023   GFRAA >60 09/25/2019   Lipid Panel:  Lab Results  Component Value Date   LDLCALC 45 04/06/2023   HgbA1c:  Lab Results  Component Value Date   HGBA1C 6.2 (A) 11/14/2023   Urine Drug Screen:     Component Value Date/Time   LABOPIA NONE DETECTED 11/27/2023 1538   COCAINSCRNUR NONE DETECTED 11/27/2023 1538   LABBENZ NONE DETECTED 11/27/2023 1538   AMPHETMU NONE DETECTED 11/27/2023 1538   THCU NONE DETECTED 11/27/2023 1538   LABBARB NONE DETECTED 11/27/2023 1538    Alcohol Level     Component Value Date/Time   ETH <15 11/27/2023 1545   INR  Lab  Results  Component Value Date   INR 1.0 11/27/2023   APTT  Lab Results  Component Value Date   APTT 37 (H) 11/27/2023   AED levels: No results found for: PHENYTOIN, ZONISAMIDE, LAMOTRIGINE, LEVETIRACETA  CT Head without contrast 11/29/23(Personally reviewed): CTH was negative for a large hypodensity concerning for a large territory infarct or hyperdensity concerning for an ICH  CT Head without contrast (Personally reviewed): No acute or evolving infarct identified by CT.  Stable bilateral DBS.    Neurodiagnostics rEEG:  This study is suggestive of mild generalized cerebral dysfunction (encephalopathy). No seizures or epileptiform discharges were seen throughout the recording.   LTM EEG 11/29/2023  1116 to 11/30/2023 0500: This study is within normal limits. No seizures or epileptiform discharges were seen throughout the recording. Event button was pressed on 11/29/2023 at 1207 for staring without concomitant eeg change. This was a NON epileptic event   Assessment   ARMON ORVIS is a 73 y.o. male  with hx of afibb not on AC due to falls, Parkinsons disease on Siemet and s/p DBS in early 200s, hx of HTN, GERD, DM2, deaf, MI, OSA not compliant with CPAP who presents with difficulty walking, swelling in the R foot and BL hand.   Saw outpatient neurologist Dr. Evonnie last week for R face and tongue drawing up and DBS was drastically turned out and while there, had an episode of starring off and poorly responsive for which a CT Head was obtained and was negative. Over the weekend, he developed swelling in BL feet and urinating more often and now in depends. On Tuesday, while getting ready for EEG appointment, he had a event where he was poorly responsive and staring off and since then, has been leaning to his right.   CT head and repeat CTH are negative. He is unable to get MRI Brain. On exam today, much improvement seen in his mental status. Family states he is doing much better  than when he came in.   Patient is still slightly leaning to right per family but walking, movement, mental status all improved and there is no indication for further neurologic workup and sinemet  dose change at this time.  Family concerned that patient's HF medications may be part of the current issue as he was noted to have swelling bilateral feet and hands with increased urination and urgency. His lasix  has been held since admission and they are wondering if that medication could have caused some of these issues.   Recommendations   - discontinue LTM EEG - continue sinemet  - recommend HF consult for review of his medications per family request - neurology will be available prn for questions going forward - continue close outpatient follow-up with Dr. Evonnie ______________________________________________________________________   Signed, Rocky JAYSON Likes, NP [facility]    Attending Neurohospitalist Addendum Patient seen and examined with APP/Resident. Agree with the history and physical as documented above. Agree with the plan as documented, which I helped formulate. I have edited the note above to reflect my full findings and recommendations. I have independently reviewed the chart, obtained history, review of systems and examined the patient.I have personally reviewed pertinent head/neck/spine imaging (CT/MRI). Please feel free to call with any questions.  -- Elida Ross, MD [facility] [phone]  If 7pm- 7am, please page neurology on call as listed in [facility]ION.

## 2023-12-01 ENCOUNTER — Inpatient Hospital Stay (HOSPITAL_COMMUNITY)

## 2023-12-01 DIAGNOSIS — G20A2 Parkinson's disease without dyskinesia, with fluctuations: Secondary | ICD-10-CM | POA: Diagnosis not present

## 2023-12-01 DIAGNOSIS — R569 Unspecified convulsions: Secondary | ICD-10-CM | POA: Diagnosis not present

## 2023-12-01 DIAGNOSIS — R531 Weakness: Secondary | ICD-10-CM | POA: Diagnosis not present

## 2023-12-01 LAB — GLUCOSE, CAPILLARY
Glucose-Capillary: 127 mg/dL — ABNORMAL HIGH (ref 70–99)
Glucose-Capillary: 116 mg/dL — ABNORMAL HIGH (ref 70–99)
Glucose-Capillary: 134 mg/dL — ABNORMAL HIGH (ref 70–99)
Glucose-Capillary: 161 mg/dL — ABNORMAL HIGH (ref 70–99)

## 2023-12-01 MED ORDER — FUROSEMIDE 20 MG PO TABS
20.0000 mg | ORAL_TABLET | Freq: Every day | ORAL | Status: DC
  Administered 2023-12-01 – 2023-12-02 (×2): 20 mg via ORAL
  Filled 2023-12-01 (×2): qty 1

## 2023-12-01 NOTE — Procedures (Signed)
 Patient Name: TAHMIR KLECKNER  MRN: [id number]  Epilepsy Attending: Arlin MALVA Krebs  Referring Physician/Provider: Khaliqdina, Salman, MD  Duration: 11/30/2023 1116 to 12/01/2023 1116   Patient history: 73yo M with poor responsiveness and starring off unclear, coule be behavioral arrest. EEG to evaluate for seizure    Level of alertness: Awake, asleep   AEDs during EEG study: None   Technical aspects: This EEG study was done with scalp electrodes positioned according to the 10-20 International system of electrode placement. Electrical activity was reviewed with band pass filter of 1-70Hz , sensitivity of 7 uV/mm, display speed of 71mm/sec with a 60Hz  notched filter applied as appropriate. EEG data were recorded continuously and digitally stored.  Video monitoring was available and reviewed as appropriate.   Description: The posterior dominant rhythm consists of 8-9 Hz activity of moderate voltage (25-35 uV) seen predominantly in posterior head regions, symmetric and reactive to eye opening and eye closing. Sleep was characterized by vertex waves, sleep spindles (12 to 14 Hz), maximal frontocentral region.      IMPRESSION: This study is within normal limits. No seizures or epileptiform discharges were seen throughout the recording.   A normal interictal EEG does not exclude the diagnosis of epilepsy.     Quinnley Colasurdo O Candyce Gambino

## 2023-12-01 NOTE — Progress Notes (Signed)
MB performed maintenance on electrodes. All impedances are below 10k ohms. No skin breakdown noted.  

## 2023-12-01 NOTE — Progress Notes (Signed)
 [facility]  PROGRESS NOTE  Henry [name] FMW:[id number] DOB: 18-Dec-1950 DOA: 11/27/2023 PCP: Cleatus Arlyss RAMAN, MD   Brief HPI:    73 y.o. male with medical history significant of parkinson's disease s/p DBS, HFrEF, HTN, T2DM and multiple other medical issues here with generalized weakness.  Henry [name] notes his symptoms started 2-3 weeks ago. He has a longstanding history of parkinson's. Got a DBS placed in early 2000's. Typically able to walk with a walker, feed himself. He's noticed more freezing episodes in the past 2-3 weeks. More mobility issues. He saw his neurologist on 11/13 and at that time was noted to have right facial pulling. His DBS setting was changed at neurologist office. Symptoms became worse after change of setting of DBS.  Case was discussed with neurology who recommended admission and EEG/MRI and formal consult if concerns.     Assessment/Plan:   Generalized weakness in  setting of Parkinson disease - Usually able to walk with walker at baseline;  - Started having worsening freezing spells with progressive weakness - Was seen by neurology, at that time DBS turned off-device amplitude was turned down as a result - Patient symptoms got worse after that - CT head showed no acute abnormality - MRI brain not obtained as not compatible with patient's device - Neurology recommended EEG; which did not show up after home discharges -Continue Sinemet  PT evaluation obtained  ?  Seizure - Appreciate neurology input - EEG was unremarkable - Continuous EEG monitoring started, however no epileptic spells noted - Had 1 nonepileptic spell yesterday  Hypotension/?  Presyncope Blood pressure is improved after holding BP meds -check orthostatic vital signs - Patient's symptoms have improved after stopping his medications, doubt that patient mental status was from orthostatic hypotension/presyncope   Acute on chronic systolic heart failure -Patient now has mild  tachypnea with mild leg swelling -Lasix  was held due to hypotension; will restart low-dose Lasix  20 mg p.o. daily due to soft blood pressure -Will follow BMP in am -Echocardiogram from 01/2042 showed EF of 30 to 35%, grade 2 diastolic 2 diastolic dysfunction - BNP was 11.4 - BP meds on hold as above Continue to hold  spironolactone , losartan , metoprolol  due to soft blood pressure  Right Lower Extremity Swelling -Venous ultrasound shows no DVT   T2DM On insulin  pump at home Transition to basal/bolus and SSI while here - conservative dosing - adjust as needed CBG well-controlled    Dyslipidemia Crestor   Asthma Continue breathing treatments   Glaucoma Cosopt    Gout allopurinol        DVT prophylaxis: Lovenox   Medications     allopurinol   150 mg Oral Daily   aspirin  EC  81 mg Oral Daily   Carbidopa -Levodopa  ER  2 tablet Oral 5 X Daily   cholecalciferol   2,000 Units Oral Daily   cyanocobalamin   1,000 mcg Oral Daily   dorzolamide -timolol   1 drop Right Eye BID   DULoxetine   20 mg Oral Daily   enoxaparin  (LOVENOX ) injection  50 mg Subcutaneous Q24H   fluticasone  furoate-vilanterol  1 puff Inhalation Daily   icosapent  Ethyl  2 g Oral BID   insulin  aspart  0-5 Units Subcutaneous QHS   insulin  aspart  0-9 Units Subcutaneous TID WC   insulin  aspart  4 Units Subcutaneous TID WC   insulin  glargine-yfgn  15 Units Subcutaneous QHS   levothyroxine   150 mcg Oral Q0600   mirabegron  ER  25 mg Oral Daily   Pimavanserin  Tartrate  34 mg Oral Daily  polyethylene glycol  8.5 g Oral Daily   rosuvastatin   20 mg Oral Daily     Data Reviewed:   CBG:  Recent Labs  Lab 11/30/23 0614 11/30/23 1128 11/30/23 1645 11/30/23 2109 12/01/23 0613  GLUCAP 109* 119* 132* 118* 127*    SpO2: 92 %    Vitals:   11/30/23 1534 11/30/23 1945 12/01/23 0000 12/01/23 0813  BP: 121/74 118/77 123/78 123/76  Pulse: 67 68 70 72  Resp: 20 20 20 18   Temp: 98.2 F (36.8 C) 98.4 F (36.9 C)  98.2 F (36.8 C) 97.6 F (36.4 C)  TempSrc: Oral Oral Oral Oral  SpO2: 92% 94% 93% 92%  Weight:      Height:          Data Reviewed:  Basic Metabolic Panel: Recent Labs  Lab 11/27/23 1545 11/27/23 2157 11/28/23 0305 11/29/23 0404 11/30/23 1147  NA 138  --  139 136 136  K 4.7  --  3.7 3.8 3.7  CL 96*  --  99 96* 97*  CO2 27  --  28 27 30   GLUCOSE 96  --  99 101* 124*  BUN 28*  --  28* 25* 23  CREATININE 1.15 1.41* 1.43* 1.42* 1.30*  CALCIUM  8.9  --  8.7* 8.8* 8.7*    CBC: Recent Labs  Lab 11/27/23 1545 11/27/23 2157 11/28/23 0305 11/29/23 0404  WBC 6.6 7.0 5.8 5.4  NEUTROABS 3.9  --   --   --   HGB 15.3 14.7 13.9 13.8  HCT 47.4 45.4 42.1 42.0  MCV 89.4 89.2 89.0 87.7  PLT 144* 146* 132* 132*    LFT Recent Labs  Lab 11/27/23 1545 11/28/23 0305 11/29/23 0404  AST 21 <10* <10*  ALT 5 6 <5  ALKPHOS 66 59 61  BILITOT 1.3* 1.0 1.1  PROT 6.5 6.4* 6.2*  ALBUMIN  3.9 3.7 3.5     Antibiotics: Anti-infectives (From admission, onward)    None        CONSULTS   Code Status: DNR  Family Communication: No family present at bedside     Subjective   Patient seen and examined, appears mildly short of breath.   Objective    Physical Examination:  Mild tachypnea, decreased breath sounds at lung bases S1-S2, regular Trace edema in the lower extremities  Status is: Inpatient:             Henry [name]   [facility] If 7PM-7AM, please contact night-coverage at [email or web address], Office  [phone]   12/01/2023, 9:16 AM  LOS: 3 days

## 2023-12-02 ENCOUNTER — Inpatient Hospital Stay (HOSPITAL_COMMUNITY)

## 2023-12-02 DIAGNOSIS — R531 Weakness: Secondary | ICD-10-CM | POA: Diagnosis not present

## 2023-12-02 DIAGNOSIS — R569 Unspecified convulsions: Secondary | ICD-10-CM | POA: Diagnosis not present

## 2023-12-02 DIAGNOSIS — G20A2 Parkinson's disease without dyskinesia, with fluctuations: Secondary | ICD-10-CM | POA: Diagnosis not present

## 2023-12-02 LAB — CBC
HCT: 40 % (ref 39.0–52.0)
Hemoglobin: 13.3 g/dL (ref 13.0–17.0)
MCH: 29 pg (ref 26.0–34.0)
MCHC: 33.3 g/dL (ref 30.0–36.0)
MCV: 87.3 fL (ref 80.0–100.0)
Platelets: 116 K/uL — ABNORMAL LOW (ref 150–400)
RBC: 4.58 MIL/uL (ref 4.22–5.81)
RDW: 14 % (ref 11.5–15.5)
WBC: 6.3 K/uL (ref 4.0–10.5)
nRBC: 0 % (ref 0.0–0.2)

## 2023-12-02 LAB — BASIC METABOLIC PANEL WITH GFR
Anion gap: 9 (ref 5–15)
BUN: 20 mg/dL (ref 8–23)
CO2: 30 mmol/L (ref 22–32)
Calcium: 8.6 mg/dL — ABNORMAL LOW (ref 8.9–10.3)
Chloride: 97 mmol/L — ABNORMAL LOW (ref 98–111)
Creatinine, Ser: 1.18 mg/dL (ref 0.61–1.24)
GFR, Estimated: >60 mL/min (ref >60)
Glucose, Bld: 120 mg/dL — ABNORMAL HIGH (ref 70–99)
Potassium: 3.8 mmol/L (ref 3.5–5.1)
Sodium: 136 mmol/L (ref 135–145)

## 2023-12-02 LAB — GLUCOSE, CAPILLARY
Glucose-Capillary: 123 mg/dL — ABNORMAL HIGH (ref 70–99)
Glucose-Capillary: 130 mg/dL — ABNORMAL HIGH (ref 70–99)
Glucose-Capillary: 120 mg/dL — ABNORMAL HIGH (ref 70–99)
Glucose-Capillary: 119 mg/dL — ABNORMAL HIGH (ref 70–99)

## 2023-12-02 MED ORDER — SPIRONOLACTONE 12.5 MG HALF TABLET
12.5000 mg | ORAL_TABLET | Freq: Every day | ORAL | Status: DC
  Administered 2023-12-02 – 2023-12-05 (×4): 12.5 mg via ORAL
  Filled 2023-12-02 (×4): qty 1

## 2023-12-02 MED ORDER — FUROSEMIDE 20 MG PO TABS
20.0000 mg | ORAL_TABLET | Freq: Two times a day (BID) | ORAL | Status: DC
  Administered 2023-12-02 – 2023-12-03 (×2): 20 mg via ORAL
  Filled 2023-12-02 (×2): qty 1

## 2023-12-02 NOTE — Progress Notes (Signed)
 [facility]  PROGRESS NOTE  Henry [name] FMW:[id number] DOB: [birth year] DOA: 11/27/2023 PCP: Cleatus Arlyss RAMAN, MD   Brief HPI:    73 y.o. male with medical history significant of parkinson's disease s/p DBS, HFrEF, HTN, T2DM and multiple other medical issues here with generalized weakness.  Henry [name] notes his symptoms started 2-3 weeks ago. He has a longstanding history of parkinson's. Got a DBS placed in early 2000's. Typically able to walk with a walker, feed himself. He's noticed more freezing episodes in the past 2-3 weeks. More mobility issues. He saw his neurologist on 11/13 and at that time was noted to have right facial pulling. His DBS setting was changed at neurologist office. Symptoms became worse after change of setting of DBS.  Case was discussed with neurology who recommended admission and EEG/MRI and formal consult if concerns.     Assessment/Plan:   Generalized weakness in  setting of Parkinson disease - Usually able to walk with walker at baseline;  - Started having worsening freezing spells with progressive weakness - Was seen by neurology, at that time DBS turned off-device amplitude was turned down as a result - Patient symptoms got worse after that - CT head showed no acute abnormality - MRI brain not obtained as not compatible with patient's device - Neurology recommended EEG; which did not show up after home discharges -Continue Sinemet  -PT evaluation obtained; recommend inpatient rehab  ?  Seizure - Appreciate neurology input - EEG was unremarkable - Continuous EEG monitoring started, however no epileptic spells noted - Had 1 nonepileptic spell yesterday - No seizures noted on LTM EEG  Hypotension/?  Presyncope Blood pressure is improved after holding BP meds -check orthostatic vital signs - Patient's symptoms have improved after stopping his medications, doubt that patient mental status was from orthostatic hypotension/presyncope   Acute  on chronic systolic heart failure -Patient now has mild tachypnea with mild leg swelling -Lasix  was held due to hypotension; will restart low-dose Lasix  20 mg p.o. daily due to soft blood pressure -Will follow BMP in am -Echocardiogram from 01/2042 showed EF of 30 to 35%, grade 2 diastolic 2 diastolic dysfunction - BNP was 11.4 - Started on Lasix  20 mg daily, will increase to 20 mg p.o. twice daily - Will restart Aldactone  12.5 mg daily - Continue to hold losartan , metoprolol   Right Lower Extremity Swelling -Venous ultrasound shows no DVT   T2DM On insulin  pump at home Transition to basal/bolus and SSI while here - conservative dosing - adjust as needed CBG well-controlled    Dyslipidemia Crestor   Asthma Continue breathing treatments   Glaucoma Cosopt    Gout allopurinol        DVT prophylaxis: Lovenox   Medications     allopurinol   150 mg Oral Daily   aspirin  EC  81 mg Oral Daily   Carbidopa -Levodopa  ER  2 tablet Oral 5 X Daily   cholecalciferol   2,000 Units Oral Daily   cyanocobalamin   1,000 mcg Oral Daily   dorzolamide -timolol   1 drop Right Eye BID   DULoxetine   20 mg Oral Daily   enoxaparin  (LOVENOX ) injection  50 mg Subcutaneous Q24H   fluticasone  furoate-vilanterol  1 puff Inhalation Daily   furosemide   20 mg Oral BID   icosapent  Ethyl  2 g Oral BID   insulin  aspart  0-5 Units Subcutaneous QHS   insulin  aspart  0-9 Units Subcutaneous TID WC   insulin  aspart  4 Units Subcutaneous TID WC   insulin  glargine-yfgn  15 Units Subcutaneous  QHS   levothyroxine   150 mcg Oral Q0600   mirabegron  ER  25 mg Oral Daily   Pimavanserin  Tartrate  34 mg Oral Daily   polyethylene glycol  8.5 g Oral Daily   rosuvastatin   20 mg Oral Daily   spironolactone   12.5 mg Oral Daily     Data Reviewed:   CBG:  Recent Labs  Lab 12/01/23 1142 12/01/23 1608 12/01/23 2115 12/02/23 0613 12/02/23 1213  GLUCAP 116* 134* 161* 123* 130*    SpO2: (!) 87 %    Vitals:    12/02/23 0056 12/02/23 0357 12/02/23 0824 12/02/23 1214  BP: 123/69 123/65 122/69 117/65  Pulse: 67 72 73 70  Resp:   16 20  Temp: (!) 97.5 F (36.4 C) (!) 97.4 F (36.3 C) (!) 97.4 F (36.3 C) (!) 97.4 F (36.3 C)  TempSrc: Oral Oral Oral Oral  SpO2: 95% 93% (!) 88% (!) 87%  Weight:      Height:          Data Reviewed:  Basic Metabolic Panel: Recent Labs  Lab 11/27/23 1545 11/27/23 2157 11/28/23 0305 11/29/23 0404 11/30/23 1147 12/02/23 0431  NA 138  --  139 136 136 136  K 4.7  --  3.7 3.8 3.7 3.8  CL 96*  --  99 96* 97* 97*  CO2 27  --  28 27 30 30   GLUCOSE 96  --  99 101* 124* 120*  BUN 28*  --  28* 25* 23 20  CREATININE 1.15 1.41* 1.43* 1.42* 1.30* 1.18  CALCIUM  8.9  --  8.7* 8.8* 8.7* 8.6*    CBC: Recent Labs  Lab 11/27/23 1545 11/27/23 2157 11/28/23 0305 11/29/23 0404 12/02/23 0431  WBC 6.6 7.0 5.8 5.4 6.3  NEUTROABS 3.9  --   --   --   --   HGB 15.3 14.7 13.9 13.8 13.3  HCT 47.4 45.4 42.1 42.0 40.0  MCV 89.4 89.2 89.0 87.7 87.3  PLT 144* 146* 132* 132* 116*    LFT Recent Labs  Lab 11/27/23 1545 11/28/23 0305 11/29/23 0404  AST 21 <10* <10*  ALT 5 6 <5  ALKPHOS 66 59 61  BILITOT 1.3* 1.0 1.1  PROT 6.5 6.4* 6.2*  ALBUMIN  3.9 3.7 3.5     Antibiotics: Anti-infectives (From admission, onward)    None        CONSULTS   Code Status: DNR  Family Communication: No family present at bedside     Subjective   Patient now having wheezing and mild tachypnea.  He was taking Lasix  40 mg in the morning and 60 mg in the evening.  Here he was started on Lasix  20 mg daily due to soft blood pressure.   Objective    Physical Examination:  Appears in no acute distress Alert, oriented x 3, intact insight and judgment Cranial nerves II through XII grossly intact, no focal deficit noted Atraumatic normocephalic, extraocular muscles are intact  Status is: Inpatient:             Henry [name]   [facility] If 7PM-7AM,  please contact night-coverage at [email or web address], Office  [phone]   12/02/2023, 1:34 PM  LOS: 4 days

## 2023-12-02 NOTE — Progress Notes (Signed)
 LTM maint complete - no skin breakdown under:  Cz, C4, Fp1, Fp2. Head wrapped.

## 2023-12-02 NOTE — Procedures (Signed)
 Patient Name: Henry [name]  MRN: [id number]  Epilepsy Attending: Arlin MALVA Krebs  Referring Physician/Provider: Khaliqdina, Salman, MD  Duration: 12/01/2023 1116 to 12/02/2023 1116   Patient history: 73yo M with poor responsiveness and starring off unclear, coule be behavioral arrest. EEG to evaluate for seizure    Level of alertness: Awake, asleep   AEDs during EEG study: None   Technical aspects: This EEG study was done with scalp electrodes positioned according to the 10-20 International system of electrode placement. Electrical activity was reviewed with band pass filter of 1-70Hz , sensitivity of 7 uV/mm, display speed of 64mm/sec with a 60Hz  notched filter applied as appropriate. EEG data were recorded continuously and digitally stored.  Video monitoring was available and reviewed as appropriate.   Description: The posterior dominant rhythm consists of 8-9 Hz activity of moderate voltage (25-35 uV) seen predominantly in posterior head regions, symmetric and reactive to eye opening and eye closing. Sleep was characterized by vertex waves, sleep spindles (12 to 14 Hz), maximal frontocentral region.      IMPRESSION: This study is within normal limits. No seizures or epileptiform discharges were seen throughout the recording.   A normal interictal EEG does not exclude the diagnosis of epilepsy.     Tyreece Gelles O Denise Bramblett

## 2023-12-03 ENCOUNTER — Other Ambulatory Visit: Payer: Self-pay | Admitting: Family Medicine

## 2023-12-03 ENCOUNTER — Inpatient Hospital Stay (HOSPITAL_COMMUNITY)

## 2023-12-03 ENCOUNTER — Other Ambulatory Visit: Payer: Self-pay | Admitting: Endocrinology

## 2023-12-03 DIAGNOSIS — G20A2 Parkinson's disease without dyskinesia, with fluctuations: Secondary | ICD-10-CM | POA: Diagnosis not present

## 2023-12-03 DIAGNOSIS — I5022 Chronic systolic (congestive) heart failure: Secondary | ICD-10-CM

## 2023-12-03 DIAGNOSIS — Z951 Presence of aortocoronary bypass graft: Secondary | ICD-10-CM

## 2023-12-03 DIAGNOSIS — I255 Ischemic cardiomyopathy: Secondary | ICD-10-CM

## 2023-12-03 DIAGNOSIS — I10 Essential (primary) hypertension: Secondary | ICD-10-CM

## 2023-12-03 DIAGNOSIS — I480 Paroxysmal atrial fibrillation: Secondary | ICD-10-CM | POA: Diagnosis not present

## 2023-12-03 DIAGNOSIS — I2510 Atherosclerotic heart disease of native coronary artery without angina pectoris: Secondary | ICD-10-CM

## 2023-12-03 DIAGNOSIS — R569 Unspecified convulsions: Secondary | ICD-10-CM

## 2023-12-03 DIAGNOSIS — R531 Weakness: Secondary | ICD-10-CM | POA: Diagnosis not present

## 2023-12-03 DIAGNOSIS — R0989 Other specified symptoms and signs involving the circulatory and respiratory systems: Secondary | ICD-10-CM | POA: Diagnosis not present

## 2023-12-03 DIAGNOSIS — R0602 Shortness of breath: Secondary | ICD-10-CM | POA: Diagnosis not present

## 2023-12-03 DIAGNOSIS — K8020 Calculus of gallbladder without cholecystitis without obstruction: Secondary | ICD-10-CM | POA: Diagnosis not present

## 2023-12-03 DIAGNOSIS — R918 Other nonspecific abnormal finding of lung field: Secondary | ICD-10-CM | POA: Diagnosis not present

## 2023-12-03 DIAGNOSIS — I5042 Chronic combined systolic (congestive) and diastolic (congestive) heart failure: Secondary | ICD-10-CM

## 2023-12-03 DIAGNOSIS — Z95810 Presence of automatic (implantable) cardiac defibrillator: Secondary | ICD-10-CM | POA: Diagnosis not present

## 2023-12-03 DIAGNOSIS — K5900 Constipation, unspecified: Secondary | ICD-10-CM | POA: Diagnosis not present

## 2023-12-03 LAB — GLUCOSE, CAPILLARY
Glucose-Capillary: 112 mg/dL — ABNORMAL HIGH (ref 70–99)
Glucose-Capillary: 127 mg/dL — ABNORMAL HIGH (ref 70–99)
Glucose-Capillary: 158 mg/dL — ABNORMAL HIGH (ref 70–99)
Glucose-Capillary: 94 mg/dL (ref 70–99)
Glucose-Capillary: 146 mg/dL — ABNORMAL HIGH (ref 70–99)

## 2023-12-03 LAB — BASIC METABOLIC PANEL WITH GFR
Anion gap: 9 (ref 5–15)
BUN: 19 mg/dL (ref 8–23)
CO2: 31 mmol/L (ref 22–32)
Calcium: 8.7 mg/dL — ABNORMAL LOW (ref 8.9–10.3)
Chloride: 96 mmol/L — ABNORMAL LOW (ref 98–111)
Creatinine, Ser: 0.99 mg/dL (ref 0.61–1.24)
GFR, Estimated: >60 mL/min (ref >60)
Glucose, Bld: 102 mg/dL — ABNORMAL HIGH (ref 70–99)
Potassium: 3.9 mmol/L (ref 3.5–5.1)
Sodium: 136 mmol/L (ref 135–145)

## 2023-12-03 LAB — BRAIN NATRIURETIC PEPTIDE: B Natriuretic Peptide: 37.6 pg/mL (ref 0.0–100.0)

## 2023-12-03 LAB — PROCALCITONIN: Procalcitonin: <0.1 ng/mL

## 2023-12-03 MED ORDER — FUROSEMIDE 40 MG PO TABS
40.0000 mg | ORAL_TABLET | Freq: Every day | ORAL | Status: DC
  Administered 2023-12-04 – 2023-12-05 (×2): 40 mg via ORAL
  Filled 2023-12-03 (×2): qty 1

## 2023-12-03 MED ORDER — POLYETHYLENE GLYCOL 3350 17 G PO PACK
8.5000 g | PACK | Freq: Two times a day (BID) | ORAL | Status: DC
  Administered 2023-12-03 – 2023-12-05 (×5): 8.5 g via ORAL
  Filled 2023-12-03 (×5): qty 1

## 2023-12-03 MED ORDER — FUROSEMIDE 10 MG/ML IJ SOLN
20.0000 mg | Freq: Once | INTRAMUSCULAR | Status: AC
  Administered 2023-12-03: 20 mg via INTRAVENOUS
  Filled 2023-12-03: qty 4

## 2023-12-03 MED ORDER — BISACODYL 10 MG RE SUPP
10.0000 mg | Freq: Once | RECTAL | Status: AC
  Administered 2023-12-03: 10 mg via RECTAL
  Filled 2023-12-03: qty 1

## 2023-12-03 MED ORDER — METOPROLOL SUCCINATE ER 25 MG PO TB24
25.0000 mg | ORAL_TABLET | Freq: Every day | ORAL | Status: DC
  Administered 2023-12-04 – 2023-12-05 (×2): 25 mg via ORAL
  Filled 2023-12-03 (×2): qty 1

## 2023-12-03 NOTE — Procedures (Addendum)
 Patient Name: Henry [name]  MRN: [id number]  Epilepsy Attending: Arlin MALVA Krebs  Referring Physician/Provider: Khaliqdina, Salman, MD  Duration: 12/02/2023 1116 to 12/03/2023 1116   Patient history: 73yo M with poor responsiveness and starring off unclear, coule be behavioral arrest. EEG to evaluate for seizure    Level of alertness: Awake, asleep   AEDs during EEG study: None   Technical aspects: This EEG study was done with scalp electrodes positioned according to the 10-20 International system of electrode placement. Electrical activity was reviewed with band pass filter of 1-70Hz , sensitivity of 7 uV/mm, display speed of 86mm/sec with a 60Hz  notched filter applied as appropriate. EEG data were recorded continuously and digitally stored.  Video monitoring was available and reviewed as appropriate.   Description: The posterior dominant rhythm consists of 8-9 Hz activity of moderate voltage (25-35 uV) seen predominantly in posterior head regions, symmetric and reactive to eye opening and eye closing. Sleep was characterized by vertex waves, sleep spindles (12 to 14 Hz), maximal frontocentral region.      IMPRESSION: This study is within normal limits. No seizures or epileptiform discharges were seen throughout the recording.   A normal interictal EEG does not exclude the diagnosis of epilepsy.     Aleecia Tapia O Ettamae Barkett

## 2023-12-03 NOTE — Progress Notes (Addendum)
 [facility]  PROGRESS NOTE  HA PLACERES FMW:[id number] DOB: 1950-10-05 DOA: 11/27/2023 PCP: Cleatus Arlyss RAMAN, MD   Brief HPI:    73 y.o. male with medical history significant of parkinson's disease s/p DBS, HFrEF, HTN, T2DM and multiple other medical issues here with generalized weakness.  Henry [name] his symptoms started 2-3 weeks ago. He has a longstanding history of parkinson's. Got a DBS placed in early 2000's. Typically able to walk with a walker, feed himself. He's noticed more freezing episodes in the past 2-3 weeks. More mobility issues. He saw his neurologist on 11/13 and at that time was noted to have right facial pulling. His DBS setting was changed at neurologist office. Symptoms became worse after change of setting of DBS.  Case was discussed with neurology who recommended admission and EEG/MRI and formal consult if concerns.     Assessment/Plan:   Generalized weakness in  setting of Parkinson disease - Usually able to walk with walker at baseline;  - Started having worsening freezing spells with progressive weakness - Was seen by neurology, at that time DBS turned off-device amplitude was turned down as a result - Patient symptoms got worse after that - CT head showed no acute abnormality - MRI brain not obtained as not compatible with patient's device - Neurology recommended EEG; which did not show up after home discharges -Continue Sinemet  -PT evaluation obtained; recommend inpatient rehab  ?  Seizure - Appreciate neurology input - EEG was unremarkable - Continuous EEG monitoring started, however no epileptic spells noted - Had 1 nonepileptic spell yesterday - No seizures noted on LTM EEG  Hypotension/?  Presyncope Blood pressure is improved after holding BP meds -check orthostatic vital signs - Patient's symptoms have improved after stopping his medications, doubt that patient mental status was from orthostatic  hypotension/presyncope  Constipation - Seen on CT abdomen/pelvis - He is currently on MiraLAX  17 g daily - Will increase MiraLAX  to 17 g p.o. twice daily - 1 dose of Dulcolax suppository    Acute on chronic systolic heart failure -Patient now has mild tachypnea with mild leg swelling -Lasix  was held due to hypotension; will restart low-dose Lasix  20 mg p.o. daily due to soft blood pressure -Will follow BMP in am -Echocardiogram from 01/2042 showed EF of 30 to 35%, grade 2 diastolic 2 diastolic dysfunction - BNP was 11.4 - Started on Lasix  20 mg daily, will increase to 20 mg p.o. twice daily - Will restart Aldactone  12.5 mg daily - Continue to hold losartan , metoprolol  - Will consult cardiology for further management of acute on chronic systolic heart failure  Questionable pneumonia - Seen on CT chest; patient does have bilateral pulm pulmonary edema - He has a history of CHF - He is afebrile, normal vitals, not tachypneic - Doubt he has pneumonia, will check procalcitonin  Right Lower Extremity Swelling -Venous ultrasound shows no DVT   T2DM On insulin  pump at home Transition to basal/bolus and SSI while here - conservative dosing - adjust as needed CBG well-controlled    Dyslipidemia Crestor   Asthma Continue breathing treatments   Glaucoma Cosopt    Gout allopurinol        DVT prophylaxis: Lovenox   Medications     allopurinol   150 mg Oral Daily   aspirin  EC  81 mg Oral Daily   Carbidopa -Levodopa  ER  2 tablet Oral 5 X Daily   cholecalciferol   2,000 Units Oral Daily   cyanocobalamin   1,000 mcg Oral Daily   dorzolamide -timolol   1  drop Right Eye BID   DULoxetine   20 mg Oral Daily   enoxaparin  (LOVENOX ) injection  50 mg Subcutaneous Q24H   fluticasone  furoate-vilanterol  1 puff Inhalation Daily   furosemide   20 mg Oral BID   icosapent  Ethyl  2 g Oral BID   insulin  aspart  0-5 Units Subcutaneous QHS   insulin  aspart  0-9 Units Subcutaneous TID WC    insulin  aspart  4 Units Subcutaneous TID WC   insulin  glargine-yfgn  15 Units Subcutaneous QHS   levothyroxine   150 mcg Oral Q0600   mirabegron  ER  25 mg Oral Daily   Pimavanserin  Tartrate  34 mg Oral Daily   polyethylene glycol  8.5 g Oral BID   rosuvastatin   20 mg Oral Daily   spironolactone   12.5 mg Oral Daily     Data Reviewed:   CBG:  Recent Labs  Lab 12/02/23 1213 12/02/23 1636 12/02/23 2107 12/03/23 0612 12/03/23 0803  GLUCAP 130* 120* 119* 112* 127*    SpO2: 98 % O2 Flow Rate (L/min): 2 L/min    Vitals:   12/02/23 1558 12/02/23 2004 12/02/23 2339 12/03/23 0500  BP: 122/77 126/78 127/69 127/77  Pulse: 68 90 76 73  Resp: 16 20 (!) 21 19  Temp: (!) 97.4 F (36.3 C) 97.9 F (36.6 C) 98 F (36.7 C) 98 F (36.7 C)  TempSrc: Oral Oral Oral Axillary  SpO2: 93% 91% 92% 98%  Weight:      Height:          Data Reviewed:  Basic Metabolic Panel: Recent Labs  Lab 11/28/23 0305 11/29/23 0404 11/30/23 1147 12/02/23 0431 12/03/23 0210  NA 139 136 136 136 136  K 3.7 3.8 3.7 3.8 3.9  CL 99 96* 97* 97* 96*  CO2 28 27 30 30 31   GLUCOSE 99 101* 124* 120* 102*  BUN 28* 25* 23 20 19   CREATININE 1.43* 1.42* 1.30* 1.18 0.99  CALCIUM  8.7* 8.8* 8.7* 8.6* 8.7*    CBC: Recent Labs  Lab 11/27/23 1545 11/27/23 2157 11/28/23 0305 11/29/23 0404 12/02/23 0431  WBC 6.6 7.0 5.8 5.4 6.3  NEUTROABS 3.9  --   --   --   --   HGB 15.3 14.7 13.9 13.8 13.3  HCT 47.4 45.4 42.1 42.0 40.0  MCV 89.4 89.2 89.0 87.7 87.3  PLT 144* 146* 132* 132* 116*    LFT Recent Labs  Lab 11/27/23 1545 11/28/23 0305 11/29/23 0404  AST 21 <10* <10*  ALT 5 6 <5  ALKPHOS 66 59 61  BILITOT 1.3* 1.0 1.1  PROT 6.5 6.4* 6.2*  ALBUMIN  3.9 3.7 3.5     Antibiotics: Anti-infectives (From admission, onward)    None        CONSULTS   Code Status: DNR  Family Communication: No family present at bedside     Subjective    Patient became confused last night.  Wanted to  leave the hospital.  Chest x-ray obtained last night showed bilateral pulmonary edema.  CT chest abdomen/pelvis obtained last night showed new consolidation or collapse of right middle and lower lobes.  Questionable developing pneumonia  Objective    Physical Examination:  Appears somnolent, but arousable S1-S2, regular Lungs decreased breath sound bilaterally Extremities trace edema bilaterally  Status is: Inpatient:             Henry [name]   [facility] If 7PM-7AM, please contact night-coverage at [email or web address], Office  [phone]   12/03/2023, 8:22 AM  LOS: 5  days

## 2023-12-03 NOTE — Progress Notes (Signed)
 Inpatient Rehab Coordinator Note:  I met with patient and his daughter at bedside to discuss CIR recommendations and goals/expectations of CIR stay.  We reviewed 3 hrs/day of therapy, physician follow up, and average length of stay 2 weeks (dependent upon progress) with goals of min assist overall.  Pt's spouse is home with him 24/7 and able to provide expected level of care. Pt's other daughter and SIL also live with them and can provide physical assist PRN.  We reviewed Medicare benefits. Pt still on LT EEG and pending cardiology consult so will follow for medical progression.   Reche Lowers, PT, DPT Admissions Coordinator [phone] 12/03/23 1:24 PM

## 2023-12-03 NOTE — Progress Notes (Signed)
 Physical Therapy Treatment Patient Details Name: Henry [name] MRN: [id number] DOB: [birth year] Today's Date: 12/03/2023   History of Present Illness 73 y.o. male presents to [facility] hospital on 11/27/2023 with generalized weakness. Pt currenlty on continuous EEG. PMH includes Parkinson's disease s/p deep brain stimulator, HFrEF, HTN, DMII, PAF, dysphagia, R foot dystonia s/p botox  injections.    PT Comments  Pt received in supine and agreeable to session. Pt reports increased weakness and decreased coordination today. Pt able to sit to EOB with increased assist for BLE management and stand with mod A for rise. Pt attempts to complete static standing marches, but demonstrates very low foot clearance and fatigues quickly. Pt maintains flexed trunk despite cues and facilitation of upright posture. Pt noted to wheeze at rest and during mobility and pt reports difficulty taking a deep breath, RN aware. Pt continues to benefit from PT services to progress toward functional mobility goals.    If plan is discharge home, recommend the following: A lot of help with walking and/or transfers;A lot of help with bathing/dressing/bathroom;Assistance with cooking/housework;Assist for transportation;Help with stairs or ramp for entrance   Can travel by private vehicle        Equipment Recommendations  None recommended by PT    Recommendations for Other Services       Precautions / Restrictions Precautions Precautions: Fall Recall of Precautions/Restrictions: Impaired Restrictions Weight Bearing Restrictions Per Provider Order: No     Mobility  Bed Mobility Overal bed mobility: Needs Assistance Bed Mobility: Supine to Sit, Sit to Supine     Supine to sit: Mod assist, HOB elevated, Max assist Sit to supine: Max assist   General bed mobility comments: Assist for BLE management and trunk elevation. Increased BLE resistance to advancement once closer to EOB    Transfers Overall transfer level:  Needs assistance Equipment used: Rolling walker (2 wheels) Transfers: Sit to/from Stand, Bed to chair/wheelchair/BSC Sit to Stand: Mod assist, From elevated surface           General transfer comment: STS from EOB with mod A for power up. Facilitation of upright posture    Ambulation/Gait               General Gait Details: unable to progress due to poor posture, weakness, and fatigue   Stairs             Wheelchair Mobility     Tilt Bed    Modified Rankin (Stroke Patients Only)       Balance Overall balance assessment: Needs assistance Sitting-balance support: Feet supported, No upper extremity supported Sitting balance-Leahy Scale: Fair Sitting balance - Comments: sitting EOB   Standing balance support: Bilateral upper extremity supported, Reliant on assistive device for balance, During functional activity Standing balance-Leahy Scale: Poor Standing balance comment: reliant on RW for support                            Communication Communication Communication: Impaired Factors Affecting Communication: Reduced clarity of speech;Hearing impaired  Cognition Arousal: Alert Behavior During Therapy: Flat affect   PT - Cognitive impairments: Difficult to assess, Problem solving                         Following commands: Impaired Following commands impaired: Follows one step commands with increased time    Cueing Cueing Techniques: Verbal cues, Tactile cues  Exercises General Exercises - Lower Extremity Hip Flexion/Marching: AROM,  Standing, Both, 5 reps    General Comments General comments (skin integrity, edema, etc.): patient with trunk flexed when standing, able to correct posture but returns to flexed      Pertinent Vitals/Pain Pain Assessment Pain Assessment: No/denies pain     PT Goals (current goals can now be found in the care plan section) Acute Rehab PT Goals Patient Stated Goal: to return to baseline,  ambulating for household distances with use of RW or U-walker PT Goal Formulation: With patient/family Time For Goal Achievement: 12/12/23 Progress towards PT goals: Progressing toward goals    Frequency    Min 2X/week       AM-PAC PT 6 Clicks Mobility   Outcome Measure  Help needed turning from your back to your side while in a flat bed without using bedrails?: A Little Help needed moving from lying on your back to sitting on the side of a flat bed without using bedrails?: A Little Help needed moving to and from a bed to a chair (including a wheelchair)?: A Lot Help needed standing up from a chair using your arms (e.g., wheelchair or bedside chair)?: A Lot Help needed to walk in hospital room?: A Lot Help needed climbing 3-5 steps with a railing? : Total 6 Click Score: 13    End of Session Equipment Utilized During Treatment: Gait belt Activity Tolerance: Patient tolerated treatment well;Patient limited by fatigue Patient left: in bed;with call bell/phone within reach;with bed alarm set;with family/visitor present Nurse Communication: Mobility status PT Visit Diagnosis: Other abnormalities of gait and mobility (R26.89);Muscle weakness (generalized) (M62.81);Other symptoms and signs involving the nervous system (R29.898)     Time: 8679-8662 PT Time Calculation (min) (ACUTE ONLY): 17 min  Charges:    $Therapeutic Activity: 8-22 mins PT General Charges $$ ACUTE PT VISIT: 1 Visit                    Darryle George, PTA Acute Rehabilitation Services Secure Chat Preferred  [phone]    Darryle George 12/03/2023, 2:43 PM

## 2023-12-03 NOTE — Progress Notes (Signed)
 Occupational Therapy Treatment Patient Details Name: Henry [name] MRN: [id number] DOB: 10-22-1950 Today's Date: 12/03/2023   History of present illness 73 y.o. male presents to [facility] hospital on 11/27/2023 with generalized weakness. Pt currenlty on continuous EEG. PMH includes Parkinson's disease s/p deep brain stimulator, HFrEF, HTN, DMII, PAF, dysphagia, R foot dystonia s/p botox  injections.   OT comments  Patient received in supine, on EEG, and family present. Patient eager to participate with OT with patient requiring mod assist to get to EOB due to assistance needed with BLEs and trunk. Patient performed grooming seated on EOB and sit to stand with mod assist from raised bed. Patient asked to use [facility] with mod assist for transfer and cues for hand placement. Patient returned to EOB and appeared to have increased fatigue and was assisted back to supine.  Patient will benefit from intensive inpatient follow-up therapy, >3 hours/day.  Acute OT to continue to follow to address established goals to facilitate DC to next venue of care.        If plan is discharge home, recommend the following:  A lot of help with bathing/dressing/bathroom;Assistance with cooking/housework;Assistance with feeding;Direct supervision/assist for medications management;Direct supervision/assist for financial management;Assist for transportation;Help with stairs or ramp for entrance;A lot of help with walking and/or transfers   Equipment Recommendations  None recommended by OT    Recommendations for Other Services      Precautions / Restrictions Precautions Precautions: Fall Recall of Precautions/Restrictions: Impaired Restrictions Weight Bearing Restrictions Per Provider Order: No       Mobility Bed Mobility Overal bed mobility: Needs Assistance Bed Mobility: Supine to Sit, Sit to Supine     Supine to sit: Mod assist, HOB elevated Sit to supine: Mod assist   General bed mobility comments: assistance  with BLE and trunk to get to EOB and assistance with BLE to return to supine    Transfers Overall transfer level: Needs assistance Equipment used: Rolling walker (2 wheels) Transfers: Sit to/from Stand, Bed to chair/wheelchair/BSC Sit to Stand: Mod assist, From elevated surface     Step pivot transfers: Min assist     General transfer comment: mod assist to stand from raised bed and [facility] with education on hand placement.     Balance Overall balance assessment: Needs assistance Sitting-balance support: Feet supported, No upper extremity supported Sitting balance-Leahy Scale: Fair Sitting balance - Comments: sitting EOB   Standing balance support: Bilateral upper extremity supported, Reliant on assistive device for balance, During functional activity Standing balance-Leahy Scale: Poor Standing balance comment: reliant on RW for support                           ADL either performed or assessed with clinical judgement   ADL Overall ADL's : Needs assistance/impaired     Grooming: Wash/dry hands;Wash/dry face;Contact guard assist;Sitting Grooming Details (indicate cue type and reason): on EOB                 Toilet Transfer: Moderate assistance;Rolling walker (2 wheels);BSC/3in1 Toilet Transfer Details (indicate cue type and reason): step pivot transfer to [facility] with mod assist for sit to stand and min assist for balance with cues for hand placement                Extremity/Trunk Assessment              Vision       Perception     Praxis  Communication Communication Communication: Impaired Factors Affecting Communication: Reduced clarity of speech;Hearing impaired   Cognition Arousal: Alert Behavior During Therapy: Flat affect Cognition: History of cognitive impairments             OT - Cognition Comments: can be impulsive                 Following commands: Impaired Following commands impaired: Follows one step commands  with increased time      Cueing   Cueing Techniques: Verbal cues, Tactile cues  Exercises      Shoulder Instructions       General Comments patient with trunk flexed when standing, able to correct posture but returns to flexed    Pertinent Vitals/ Pain       Pain Assessment Pain Assessment: No/denies pain  Home Living                                          Prior Functioning/Environment              Frequency  Min 2X/week        Progress Toward Goals  OT Goals(current goals can now be found in the care plan section)  Progress towards OT goals: Progressing toward goals  Acute Rehab OT Goals OT Goal Formulation: With patient/family Time For Goal Achievement: 12/12/23 Potential to Achieve Goals: Good ADL Goals Pt Will Perform Eating: with set-up;sitting Pt Will Perform Grooming: with set-up;sitting Pt Will Perform Upper Body Bathing: with min assist;sitting Pt Will Perform Upper Body Dressing: with min assist;sitting Pt Will Transfer to Toilet: with min assist;ambulating;bedside commode  Plan      Co-evaluation                 AM-PAC OT 6 Clicks Daily Activity     Outcome Measure   Help from another person eating meals?: A Lot Help from another person taking care of personal grooming?: A Lot Help from another person toileting, which includes using toliet, bedpan, or urinal?: A Lot Help from another person bathing (including washing, rinsing, drying)?: A Lot Help from another person to put on and taking off regular upper body clothing?: A Lot Help from another person to put on and taking off regular lower body clothing?: A Lot 6 Click Score: 12    End of Session Equipment Utilized During Treatment: Gait belt;Rolling walker (2 wheels)  OT Visit Diagnosis: Unsteadiness on feet (R26.81);Other abnormalities of gait and mobility (R26.89);Muscle weakness (generalized) (M62.81);Other symptoms and signs involving cognitive function    Activity Tolerance Patient tolerated treatment well   Patient Left in bed;with call bell/phone within reach;with family/visitor present;with bed alarm set   Nurse Communication Mobility status        Time: 8978-8960 OT Time Calculation (min): 18 min  Charges: OT General Charges $OT Visit: 1 Visit OT Treatments $Self Care/Home Management : 8-22 mins  Dick Laine, OTA Acute Rehabilitation Services  Office [phone]   Jeb LITTIE Laine 12/03/2023, 11:35 AM

## 2023-12-03 NOTE — Consult Note (Signed)
 Cardiology Consultation  Patient ID: Henry [name] MRN: [id number]; DOB: 02/28/1950  Admit date: 11/27/2023 Date of Consult: 12/03/2023  PCP:  Cleatus Arlyss RAMAN, MD    HeartCare Providers Cardiologist:  Redell Shallow, MD     Patient Profile: Henry [name] is a 73 y.o. male with a hx of chronic combined CHF, ischemic cardiomyopathy s/p ICD, Parkinson's, autonomic dysfunction, diabetes, hypertension, hyperlipidemia, hypothyroidism, CAD s/p CABG x 3 in 2000 (LIMA-LAD, SVG-PDA, SVG-Diag), paroxysmal A-Fib not on AC due to falls, OSA not compliant with CPAP, who is being seen 12/03/2023 for the evaluation of acute on chronic CHF at the request of Dr. Drusilla.  History of Present Illness: Mr. Ingwersen has past medical history as stated above. He presented to the [facility] ED on 11/27/2023 for weakness. He reported that his symptoms had first started 2-3 weeks prior. He got a deep brain stimulator placed in 2000s. At his baseline he was able to walk using a walker and feed himself. He noticed that he started having more freezing episodes over the past few weeks prior to arrival that were associated with mobility issues.   He had seen his neurologist Nov. 13th and was noted to have right facial pulling. They turned down his deep brain stimulator as a result. He continued to notice freezing and locking up issues. He reported that his legs and toes did not have much feeling in them. He believes that his symptoms had gotten worse since changing the settings of his deep brain stimulator.   He was admitted to the medicine service with neurology following closely as well. Since being admitted CT head and EEG were unremarkable. Brain MRI was unable to be obtained due to the device. They reported a possible seizure episode, therefore he has been placed on continuous EEG monitoring.   Relevant labs this admission: BNP normal x 2, BMP shows improving renal function with diuresis. CBC consistent  with chronic thrombocytopenia.   In regards to his CHF, his home medications were: PO Lasix  60 mg daily along with 40 mEq K in AM and 20 mEq K in PM, Losartan  25 mg daily, Toprol  50 mg at bedtime, spironolactone  12.5 mg daily.   Other relevant cardiac meds include: ASA 81 mg daily, Vascepa  2 g BID, Crestor  20 mg at bedtime.   He is a patient of Dr. Shallow but also follows with Dr. Waddell for device care. He was last seen in our office by Dr. Waddell 11/2022. At this appointment his device was noted to be functioning appropriately and he was euvolemic on exam, he weighed 259 lb. He reported spells of dizziness and falls but there were attributed to his Parkinson's at the time.   He last saw Dr. Shallow as an outpatient in 07/2022. He reported no symptoms at this time. The decision to continue off [facility] was made as he was a high risk for recurrent falls. It was reported that he did not tolerate SGLT2i in the past. Otherwise, he was doing well.   Last echocardiogram was from 01/2022 and it showed: LVEF 30-35%, global hypokinesis, G2DD, mildly reduced RV systolic function, mild BAE, borderline dilation of ascending aorta at 38 mm, normal IVC.   After speaking with the patient and his daughter who is present in the room, they agree with the history as stated above. She tells me that he was doing fairly well until a few weeks ago when he started experiencing the weakness. She reports that he overall has been getting  better. She tells me that the nursing staff reported that he was struggling some this morning with swallowing pills but they were unsure if he was just too drowsy. They admit that his BP was low, with SBPs in 80-90s when he was first here and that's why his home medications were held. He has been getting PO Lasix , this was increased to BID dosing yesterday and spironolactone  was added. Renal function improved. There are no I&O's or weights documented at all this admission.   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   Dysrhythmia    a-fib   GERD (gastroesophageal reflux disease)    Glaucoma    right eye   History of kidney stones    multiple   HLD (hyperlipidemia)    HTN (hypertension)    pt denies 08/19/12   Hyperplasia, prostate    Hypothyroidism    MI (myocardial infarction) (HCC)    Dr Pietro 2000, x3vessels bypass   Neuromuscular disorder (HCC)    Parkinson's Disease   OSA (obstructive sleep apnea)    AHI-28,on CPAP, noncompliant with CPAP   Parkinson disease (HCC)    1999   PONV (postoperative nausea and vomiting)    Restless legs    Shortness of breath    Hx: of at all times   UTI (lower urinary tract infection) 09/15/2012   Klebsiella   Ventral hernia    Walker as ambulation aid    also uses wheelchair at home/when going out   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   BIOPSY  10/14/2018   Procedure: BIOPSY;  Surgeon: Albertus Gordy HERO, MD;  Location: [facility];  Service: Gastroenterology;;   CATARACT EXTRACTION W/ INTRAOCULAR LENS IMPLANT     Hx: of right eye   CATARACT EXTRACTION W/ INTRAOCULAR LENS IMPLANT Left 2018   COLONOSCOPY N/A 10/13/2014   Procedure: COLONOSCOPY;  Surgeon: Gordy HERO Albertus, MD;  Location: [facility];  Service: Gastroenterology;  Laterality: N/A;   COLONOSCOPY W/ BIOPSIES AND POLYPECTOMY     Hx: of   COLONOSCOPY WITH PROPOFOL  N/A 10/14/2018   Procedure: COLONOSCOPY WITH PROPOFOL ;  Surgeon: Albertus Gordy HERO, MD;  Location: [facility];  Service: Gastroenterology;   Laterality: N/A;   CORONARY ARTERY BYPASS GRAFT  2000   Sudie Laine, MD   CORONARY STENT PLACEMENT  1998   DEEP BRAIN STIMULATOR PLACEMENT  2004   Right and left VIN stimulator placement (parkinsons)   EYE SURGERY     Cataract removed in 1980s related to wood in eye when 73 years old   FINGER AMPUTATION     left pointer   IMPLANTABLE CARDIOVERTER DEFIBRILLATOR IMPLANT N/A 11/13/2013   Procedure: IMPLANTABLE CARDIOVERTER DEFIBRILLATOR IMPLANT;  Surgeon: Danelle LELON Waddell, MD;  Location: [facility] LAB;  Service: Cardiovascular;  Laterality: N/A;   INSERT / REPLACE / REMOVE PACEMAKER     medtronic  LEFT AND RIGHT HEART CATHETERIZATION WITH CORONARY ANGIOGRAM N/A 09/24/2013   Procedure: LEFT AND RIGHT HEART CATHETERIZATION WITH CORONARY ANGIOGRAM;  Surgeon: Lonni JONETTA Cash, MD;  Location: [facility] CATH LAB;  Service: Cardiovascular;  Laterality: N/A;   LITHOTRIPSY     3 different times   POLYPECTOMY  10/14/2018   Procedure: POLYPECTOMY;  Surgeon: Albertus Gordy HERO, MD;  Location: [facility];  Service: Gastroenterology;;   PULSE GENERATOR IMPLANT Right 11/13/2017   Procedure: Right chest implantable pulse generator change;  Surgeon: Unice Pac, MD;  Location: [facility] OR;  Service: Neurosurgery;  Laterality: Right;  Right chest implantable pulse generator change   SUBTHALAMIC STIMULATOR BATTERY REPLACEMENT N/A 09/05/2012   Procedure: Deep brain stimulator battery change;  Surgeon: Pac Unice, MD;  Location: [facility];  Service: Neurosurgery;  Laterality: N/A;  Deep brain stimulator battery change   SUBTHALAMIC STIMULATOR BATTERY REPLACEMENT N/A 06/10/2015   Procedure: Deep Brain stimulator battery change;  Surgeon: Pac Unice, MD;  Location: [facility];  Service: Neurosurgery;  Laterality: N/A;   SUBTHALAMIC STIMULATOR BATTERY REPLACEMENT Right 09/25/2019   Procedure: Deep brain stimulator battery change;  Surgeon: Unice Pac, MD;  Location: [facility] OR;  Service: Neurosurgery;  Laterality:  Right;   SUBTHALAMIC STIMULATOR BATTERY REPLACEMENT Right 12/15/2022   Procedure: IMPLANTABLE PULSE GENERATOR  BATTERY REPLACEMENT;  Surgeon: Carollee Lani BROCKS, DO;  Location: [facility] OR;  Service: Neurosurgery;  Laterality: Right;  3C   TONSILLECTOMY      Home Medications:  Prior to Admission medications   Medication Sig Start Date End Date Taking? Authorizing Provider  acetaminophen  (TYLENOL ) 500 MG tablet Take 1,000 mg by mouth at bedtime as needed for mild pain or headache.    Yes [provider]  albuterol  (VENTOLIN  HFA) 108 (90 Base) MCG/ACT inhaler TAKE 2 PUFFS BY MOUTH EVERY 6 HOURS AS NEEDED FOR WHEEZE OR SHORTNESS OF BREATH 12/24/22  Yes Cleatus Arlyss RAMAN, MD  allopurinol  (ZYLOPRIM ) 100 MG tablet Take 1.5 tablets (150 mg total) by mouth daily. 05/15/23  Yes Cleatus Arlyss RAMAN, MD  aspirin  EC 81 MG tablet Take 1 tablet (81 mg total) by mouth daily. Swallow whole. 12/22/22  Yes Dawley, Troy C, DO  Carbidopa -Levodopa  ER (SINEMET  CR) 25-100 MG tablet controlled release TAKE 2 TABLETS BY MOUTH 5 TIMES PER DAY AS DIRECTED 11/23/23  Yes Tat, Asberry RAMAN, DO  Cholecalciferol  (VITAMIN D ) 50 MCG (2000 UT) tablet Take 2,000 Units by mouth every evening.   Yes [provider]  dorzolamide -timolol  (COSOPT ) 22.3-6.8 MG/ML ophthalmic solution Place 1 drop into the right eye 2 (two) times daily. 08/29/19  Yes [provider]  fluticasone -salmeterol (WIXELA INHUB) 250-50 MCG/ACT AEPB INHALE 1 PUFF INTO THE LUNGS IN THE MORNING AND AT BEDTIME**RINSE AFTER USE** Patient taking differently: Inhale 1 puff into the lungs daily as needed (for shortness of breath). 12/24/22  Yes Cleatus Arlyss RAMAN, MD  furosemide  (LASIX ) 20 MG tablet TAKE 1 (20MG ) TABLET AND 1/2TAB OF THE 80 MG TABLET ONCE DAILY TO EQUAL 60 MG DAILY Patient taking differently: Take 20 mg by mouth daily. 11/12/23  Yes Pietro Redell RAMAN, MD  furosemide  (LASIX ) 80 MG tablet TAKE 1 TABLET BY MOUTH EVERY DAY Patient taking differently: Take  40 mg by mouth daily. 06/08/23  Yes Pietro Redell RAMAN, MD  icosapent  Ethyl (VASCEPA ) 1 g capsule Take 2 capsules (2 g total) by mouth 2 (two) times daily. 01/30/23  Yes Thapa, Sudan, MD  insulin  lispro (HUMALOG ) 100 UNIT/ML injection USE MAXIMUM 76 UNITS  PER   DAY WITH V-GO PUMP 01/30/23  Yes Thapa, Sudan, MD  levothyroxine  (SYNTHROID ) 150 MCG tablet TAKE 1 TABLET BY MOUTH DAILY BEFORE BREAKFAST. 09/03/23  Yes Thapa, Sudan, MD  metoprolol  succinate (TOPROL -XL) 50 MG 24 hr tablet TAKE 1 TABLET BY MOUTH EVERY DAY WITH OR IMMEDIATELY FOLLOWING A MEAL Patient taking differently: Take 50 mg by mouth every evening. 04/10/23  Yes Cleatus Arlyss RAMAN, MD  mirabegron  ER (MYRBETRIQ ) 25 MG TB24 tablet Take 1 tablet (25 mg total) by mouth daily. 08/22/23  Yes Cleatus Arlyss RAMAN, MD  nitroGLYCERIN  (NITROSTAT ) 0.4 MG SL tablet Place 1 tablet (0.4 mg total) under the tongue every 5 (five) minutes as needed for chest pain. 07/18/22 11/27/23 Yes Pietro Redell RAMAN, MD  NUPLAZID  34 MG CAPS TAKE 1 CAPSULE BY MOUTH 1 TIME A DAY 09/17/23  Yes Tat, Rebecca S, DO  OVER THE COUNTER MEDICATION Apply 1 application topically daily as needed (pain). Theraworx Pain Cream   Yes [provider]  polyethylene glycol (MIRALAX  / GLYCOLAX ) packet Take 8.5 g by mouth daily.   Yes [provider]  potassium chloride  SA (KLOR-CON  M) 20 MEQ tablet TAKE 40 MEQ BY MOUTH IN THE MORNING AND TAKE 20 MEQ IN THE EVENING 05/16/23  Yes Cleatus Arlyss RAMAN, MD  rosuvastatin  (CRESTOR ) 20 MG tablet TAKE 1 TAB BY MOUTH EVERYDAY AT BEDTIME 01/30/23  Yes Thapa, Sudan, MD  sennosides-docusate sodium  (SENOKOT-S) 8.6-50 MG tablet Take 1 tablet by mouth daily as needed for constipation. Take along with miralax .   Yes [provider]  spironolactone  (ALDACTONE ) 25 MG tablet TAKE 1/2 TABLET BY MOUTH EVERY DAY 05/15/23  Yes Pietro Redell RAMAN, MD  tirzepatide  (MOUNJARO ) 7.5 MG/0.5ML Pen Inject 7.5 mg into the skin once a week. 01/30/23  Yes Thapa, Sudan, MD   vitamin B-12 (CYANOCOBALAMIN ) 1000 MCG tablet Take 1,000 mcg by mouth daily.   Yes [provider]  Continuous Blood Gluc Receiver (DEXCOM G6 RECEIVER) DEVI Use to check blood sugar daily 02/24/21   [place], MD  Continuous Glucose Sensor (DEXCOM G6 SENSOR) MISC USE TO CHECK BLOOD SUGAR DAILY 01/30/23   Thapa, Sudan, MD  Continuous Glucose Transmitter (DEXCOM G6 TRANSMITTER) MISC Change every 3 months 01/30/23   Thapa, Sudan, MD  FREESTYLE LITE test strip USE TO CHECK BLOOD SUGAR 4 TIMES DAILY. 04/13/20   [place]on [place], MD  Insulin  Disposable Pump (OMNIPOD 5 DEXG7G6 PODS GEN 5) MISC CHANGE POD EVERY 3 DAYS 08/27/23   Thapa, Sudan, MD  Insulin  Disposable Pump (OMNIPOD 5 G6 INTRO, GEN 5,) KIT 1 kit by Does not apply route daily. 02/24/21   [place]on [place], MD  losartan  (COZAAR ) 25 MG tablet TAKE 1 TABLET (25 MG TOTAL) BY MOUTH DAILY. 12/03/23   Thapa, Sudan, MD    Scheduled Meds:  allopurinol   150 mg Oral Daily   aspirin  EC  81 mg Oral Daily   Carbidopa -Levodopa  ER  2 tablet Oral 5 X Daily   cholecalciferol   2,000 Units Oral Daily   cyanocobalamin   1,000 mcg Oral Daily   dorzolamide -timolol   1 drop Right Eye BID   DULoxetine   20 mg Oral Daily   enoxaparin  (LOVENOX ) injection  50 mg Subcutaneous Q24H   fluticasone  furoate-vilanterol  1 puff Inhalation Daily   furosemide   20 mg Oral BID   icosapent  Ethyl  2 g Oral BID   insulin  aspart  0-5 Units Subcutaneous QHS   insulin  aspart  0-9 Units Subcutaneous TID WC   insulin  aspart  4 Units Subcutaneous TID WC   insulin  glargine-yfgn  15 Units Subcutaneous QHS   levothyroxine   150 mcg Oral Q0600   mirabegron  ER  25 mg Oral Daily   Pimavanserin  Tartrate  34 mg Oral Daily   polyethylene glycol  8.5 g Oral BID   rosuvastatin   20 mg Oral Daily   spironolactone   12.5 mg Oral Daily   Continuous Infusions:  PRN Meds: acetaminophen  **OR** acetaminophen , albuterol , melatonin  Allergies:    Allergies  Allergen Reactions   Penicillins  Anaphylaxis and Rash    Because of a history of documented adverse serious drug reaction;Medi Alert bracelet  is recommended PATIENT HAS HAD A PCN REACTION WITH IMMEDIATE RASH, FACIAL/TONGUE/THROAT SWELLING, SOB, OR LIGHTHEADEDNESS WITH HYPOTENSION:  #  #  YES  #  #  Has patient had a PCN reaction causing severe rash involving mucus membranes or skin necrosis: unknown Has patient had a PCN reaction that required hospitalization NO Has patient had a PCN reaction occurring within the last 10 years: NO   Klonopin  [Clonazepam ] Other (See Comments)    agitation   Peanut-Containing Drug Products Cough   Watermelon [Citrullus Vulgaris] Other (See Comments) and Cough    Tickle in throat    Social History:   Social History   Socioeconomic History   Marital status: Married    Spouse name: Henry [name]   Number of children: 2   Years of education: 12   Highest education level: High school graduate  Occupational History   Occupation: DISABLED    Comment: [employer], [employer]  Tobacco Use   Smoking status: Never    Passive exposure: Never   Smokeless tobacco: Never  Vaping Use   Vaping status: Never Used  Substance and Sexual Activity   Alcohol use: Not Currently   Drug use: No   Sexual activity: Not Currently  Other Topics Concern   Not on file  Social History Narrative   From [place]   Retired/disability archivist   Likes to fish.     Married 1972   3 kids   [place] fan   Social Drivers of Corporate Investment Banker Strain: Low Risk  (04/05/2023)   Overall Financial Resource Strain (CARDIA)    Difficulty of Paying Living Expenses: Not hard at all  Food Insecurity: No Food Insecurity (11/28/2023)   Hunger Vital Sign    Worried About Running Out of Food in the Last Year: Never true    Ran Out of Food in the Last Year: Never true  Transportation Needs: No Transportation Needs (11/28/2023)   PRAPARE - Administrator, Civil Service (Medical): No     Lack of Transportation (Non-Medical): No  Physical Activity: Inactive (04/05/2023)   Exercise Vital Sign    Days of Exercise per Week: 0 days    Minutes of Exercise per Session: 0 min  Stress: No Stress Concern Present (04/05/2023)   [employer] of Occupational Health - Occupational Stress Questionnaire    Feeling of Stress : Not at all  Social Connections: Moderately Isolated (11/28/2023)   Social Connection and Isolation Panel    Frequency of Communication with Friends and Family: More than three times a week    Frequency of Social Gatherings with Friends and Family: Once a week    Attends Religious Services: Never    Database Administrator or Organizations: No    Attends Banker Meetings: Never    Marital Status: Married  Catering Manager Violence:  Not At Risk (11/28/2023)   Humiliation, Afraid, Rape, and Kick questionnaire    Fear of Current or Ex-Partner: No    Emotionally Abused: No    Physically Abused: No    Sexually Abused: No    Family History:   Family History  Problem Relation Age of Onset   Aneurysm Mother    Alcoholism Father    HIV/AIDS Brother 80       AIDS   Healthy Sister    Healthy Child    Peripheral vascular disease Other    Arthritis Other    Healthy Sister    Healthy Child    Healthy Child    Diabetes Neg Hx    Heart disease Neg Hx    Colon cancer Neg Hx    Prostate cancer Neg Hx     ROS:  Please see the history of present illness.  All other ROS reviewed and negative.     Physical Exam/Data: Vitals:   12/02/23 2339 12/03/23 0500 12/03/23 0842 12/03/23 1250  BP: 127/69 127/77 114/75 127/80  Pulse: 76 73 67 66  Resp: (!) 21 19 14 18   Temp: 98 F (36.7 C) 98 F (36.7 C) 97.8 F (36.6 C) (!) 97.3 F (36.3 C)  TempSrc: Oral Axillary Oral Oral  SpO2: 92% 98% 96% 96%  Weight:      Height:       No intake or output data in the 24 hours ending 12/03/23 1331    11/28/2023   10:55 AM 11/22/2023    1:05 PM 11/14/2023     2:23 PM  Last 3 Weights  Weight (lbs) 258 lb 258 lb 258 lb 9.6 oz  Weight (kg) 117.028 kg 117.028 kg 117.3 kg     Body mass index is 38.1 kg/m.   General:  in no acute distress, on 2 L oxygen via [place], continuous EEG in place HEENT: thrush noted  Vascular: Distal pulses 2+ bilaterally Cardiac:  normal S1, S2; RRR; no murmur  Lungs:  decreased breath sounds at bases   Abd: soft, nontender, no hepatomegaly  Ext: no edema Musculoskeletal:  No deformities Skin: warm and dry  Psych:  Normal affect  Relevant CV Studies:  Echocardiogram, 01/23/2022 Left ventricular ejection fraction, by estimation, is 30 to 35% . The left ventricle has moderately decreased function. The left ventricle demonstrates global hypokinesis. Left ventricular diastolic parameters are consistent with Grade II diastolic dysfunction ( pseudonormalization) .  Right ventricular systolic function is mildly reduced. The right ventricular size is normal.  Left atrial size was mildly dilated.  Right atrial size was mildly dilated.  The mitral valve is normal in structure. Trivial mitral valve regurgitation. No evidence of mitral stenosis.  The aortic valve is tricuspid. There is mild calcification of the aortic valve. Aortic valve regurgitation is not visualized. No aortic stenosis is present.  Aortic dilatation noted. There is borderline dilatation of the ascending aorta, measuring 38 mm.  The inferior vena cava is normal in size with greater than 50% respiratory variability, suggesting right atrial pressure of 3 mmHg.  Remote device check, 10/17/2023 Normal device function.  Presenting rhythm:  AS/VS  HF diagnostics currenlty abnormal   Laboratory Data: High Sensitivity Troponin:  No results for input(s): TROPONINIHS in the last 720 hours.   Chemistry Recent Labs  Lab 11/30/23 1147 12/02/23 0431 12/03/23 0210  NA 136 136 136  K 3.7 3.8 3.9  CL 97* 97* 96*  CO2 30 30 31   GLUCOSE 124* 120* 102*  BUN 23 20 19    CREATININE 1.30* 1.18 0.99  CALCIUM  8.7* 8.6* 8.7*  GFRNONAA 58* >60 >60  ANIONGAP 9 9 9     Recent Labs  Lab 11/27/23 1545 11/28/23 0305 11/29/23 0404  PROT 6.5 6.4* 6.2*  ALBUMIN  3.9 3.7 3.5  AST 21 <10* <10*  ALT 5 6 <5  ALKPHOS 66 59 61  BILITOT 1.3* 1.0 1.1   Lipids No results for input(s): CHOL, TRIG, HDL, LABVLDL, LDLCALC, CHOLHDL in the last 168 hours.  Hematology Recent Labs  Lab 11/28/23 0305 11/29/23 0404 12/02/23 0431  WBC 5.8 5.4 6.3  RBC 4.73 4.79 4.58  HGB 13.9 13.8 13.3  HCT 42.1 42.0 40.0  MCV 89.0 87.7 87.3  MCH 29.4 28.8 29.0  MCHC 33.0 32.9 33.3  RDW 14.0 13.9 14.0  PLT 132* 132* 116*   Thyroid   Recent Labs  Lab 11/27/23 1641  TSH 5.922*    BNP Recent Labs  Lab 11/27/23 1545 12/03/23 0650  BNP 11.4 37.6    DDimer No results for input(s): DDIMER in the last 168 hours.  Radiology/Studies:  CT ABDOMEN PELVIS WO CONTRAST Result Date: 12/03/2023 EXAM: CT ABDOMEN AND PELVIS WITHOUT CONTRAST 12/03/2023 04:30:00 AM TECHNIQUE: CT of the abdomen and pelvis was performed without the administration of intravenous contrast. Multiplanar reformatted images are provided for review. Automated exposure control, iterative reconstruction, and/or weight-based adjustment of the mA/kV was utilized to reduce the radiation dose to as low as reasonably achievable. COMPARISON: None available. CLINICAL HISTORY: 73 year old male. Abdominal pain, acute, nonlocalized. FINDINGS: LOWER CHEST: Previous sternotomy. Chronic cardiac pacemaker or ICD. Extensive calcified coronary artery atherosclerosis and/or stents on series 3 image 1. Chronic but increased elevation of the right hemidiaphragm. New consolidation or collapse of the right middle and lower lobes. Central airways remain patent. No significant pleural effusion. Left lung base increased atelectasis from last year. LIVER: The liver is unremarkable. GALLBLADDER AND BILE DUCTS: Small chronic 6 mm gallstone.  The gallbladder is more distended than last year (coronal image 73), but there is no convincing regional inflammation. No biliary ductal dilatation. SPLEEN: No acute abnormality. PANCREAS: Partial pancreatic atrophy. ADRENAL GLANDS: No acute abnormality. KIDNEYS, URETERS AND BLADDER: Developing left renal staghorn calculus is 10 mm on series 3 image 57. Decompressed renal collecting systems at ureters. No stones in the right kidney or ureters. No hydronephrosis. No perinephric or periureteral stranding. Negative bladder. GI AND BOWEL: Redundant large bowel. Highly redundant sigmoid colon. Moderate large bowel retained stool, most pronounced in the transverse and descending colon. Normal gas containing appendix on series 3 image 52. No large bowel inflammation. No dilated small bowel. Small chronic fat containing umbilical hernia is stable. No herniated bowel. Negative non-contrast stomach. There is no bowel obstruction. PERITONEUM AND RETROPERITONEUM: Chronic but increased presacral stranding or edema. No pelvis reflux. No ascites. No free air. VASCULATURE: Calcified atherosclerosis in the abdomen and pelvis. Aorta is normal in caliber. LYMPH NODES: No lymphadenopathy. REPRODUCTIVE ORGANS: No acute abnormality. BONES AND SOFT TISSUES: Advanced hyperostosis and degeneration in the spine. Widespread thoracic interbody ankylosis from flowing intraluminal osteophytes. Similar lumbosacral junction ankylosis. Mild bilateral body wall edema, especially at the lower abdominal flanks and pelvis, progressed from last year. No acute osseous abnormality. No focal soft tissue abnormality. IMPRESSION: 1. New consolidation or collapse of the right middle and lower lobes. Developing pneumonia not excluded. No significant pleural effusion. 2. Chronic cholelithiasis (6 mm) with increased gallbladder distention but no acute cholecystitis by CT. Right upper quadrant  ultrasound recommended if there is right-sided abdominal pain. 3. Mild  body wall edema. 4. Normal appendix. Redundant large bowel with retained stool. Chronic Left renal calculus (10 mm). Electronically signed by: Helayne Hurst MD 12/03/2023 05:10 AM EST RP Workstation: [id number]   DG Abd 1 View Result Date: 12/03/2023 EXAM: 1 VIEW XRAY OF THE ABDOMEN 12/03/2023 02:40:36 AM COMPARISON: 10/19/2023 CLINICAL HISTORY: SOB (shortness of breath) FINDINGS: LINES, TUBES AND DEVICES: Left lower quadrant device noted. BOWEL: Scattered large and small bowel gas is noted, which may represent a generalized ileus. Some retained fecal material is noted in the right colon. No free air is seen. SOFT TISSUES: No opaque urinary calculi. BONES: No acute osseous abnormality. IMPRESSION: 1. Scattered large and small bowel gas pattern that could reflect generalized ileus. 2. Retained fecal material in the right colon. Electronically signed by: Oneil Devonshire MD 12/03/2023 02:45 AM EST RP Workstation: [id number]   DG Chest Port 1 View Result Date: 12/03/2023 EXAM: 1 VIEW(S) XRAY OF THE CHEST 12/03/2023 02:40:36 AM COMPARISON: 11/27/2023 CLINICAL HISTORY: SOB (shortness of breath) FINDINGS: LINES, TUBES AND DEVICES: Left chest AICD. LUNGS AND PLEURA: Low lung volumes with elevated right hemidiaphragm. Diffuse interstitial opacities, likely pulmonary edema. Interval increase in bibasilar airspace opacities. No pleural effusion. No pneumothorax. HEART AND MEDIASTINUM: Cardiomegaly. Post-CABG changes. BONES AND SOFT TISSUES: No acute osseous abnormality. IMPRESSION: 1. Interval increase in bibasilar airspace opacities, likely pulmonary edema. Electronically signed by: Oneil Devonshire MD 12/03/2023 02:44 AM EST RP Workstation: [id number]   Overnight EEG with video Result Date: 11/30/2023 Shelton Arlin KIDD, MD     12/01/2023  8:34 AM Patient Name: Henry [name] MRN: [id number] Epilepsy Attending: Arlin KIDD Shelton Referring Physician/Provider: Khaliqdina, Salman, MD Duration: 11/29/2023 1116 to 11/30/2023 1116  Patient history: 73yo M with poor responsiveness and starring off unclear, coule be behavioral arrest. EEG to evaluate for seizure Level of alertness: Awake, asleep AEDs during EEG study: None Technical aspects: This EEG study was done with scalp electrodes positioned according to the 10-20 International system of electrode placement. Electrical activity was reviewed with band pass filter of 1-70Hz , sensitivity of 7 uV/mm, display speed of 45mm/sec with a 60Hz  notched filter applied as appropriate. EEG data were recorded continuously and digitally stored.  Video monitoring was available and reviewed as appropriate. Description: The posterior dominant rhythm consists of 8-9 Hz activity of moderate voltage (25-35 uV) seen predominantly in posterior head regions, symmetric and reactive to eye opening and eye closing. Sleep was characterized by vertex waves, sleep spindles (12 to 14 Hz), maximal frontocentral region.  Event button was pressed on 11/29/2023 at 1207. Patient was staring and  not responding.Concomitant EEG before, during and after the event showed normal posterior dominant rhythm and did not show any EEG changes suggest seizure.  Hyperventilation and photic stimulation were not performed.   IMPRESSION: This study is within normal limits. No seizures or epileptiform discharges were seen throughout the recording. Event button was pressed on 11/29/2023 at 1207 for staring without concomitant eeg change. This was a NON epileptic event A normal interictal EEG does not exclude the diagnosis of epilepsy. Priyanka KIDD Shelton   Assessment and Plan:  Chronic combined heart failure Ischemic cardiomyopathy s/p ICD Hypotension, autonomic dysfunction  Home meds: PO Lasix  60 mg daily along with 40 mEq K in AM and 20 mEq K in PM, Losartan  25 mg daily, Toprol  50 mg at bedtime, spironolactone  12.5 mg daily Does not tolerate SGLT2i Last echo from Jan. 2024: LVEF 30-35%, global  hypokinesis, G2DD, mildly reduced RV  systolic function, mild BAE, normal IVC There are no I&O's or weights charted Renal function has been improving BNP normal x 2 this admission Started on gentle IV diuresis, since transitioned to PO BP appears to be stable last 24h, SBP 120s CXR today showed pulmonary edema  Currently on PO Lasix  20 mg BID, appropriate dosing for now, will continue to monitor and titrate up to home dose as tolerated  Currently on spironolactone  12.5 mg daily Home losartan  and Toprol  have been held in the setting of low BP Orders placed to monitor strict I&O's, daily weights, daily BMPs  Paroxysmal atrial fibrillation Home meds: Toprol  50 mg at bedtime Decision was made to not pursue long-term anticoagulation with fall risk  Not presently on telemetry HR has remained 60-70s Currently holding home Toprol  with hypotension and low-normal HR Agree with continuing to defer long-term AC  CAD s/p CABG x 3 in 2003 Hyperlipidemia LIMA-LAD, SVG-PDA, SVG-Diag Home meds: ASA 81 mg daily, Vascepa  2 g BID, Crestor  20 mg at bedtime 04/06/2023: HDL 30.10; LDL Cholesterol 45 11/29/2023: ALT <5  Continue home meds   Per primary Generalized weakness Possible seizure activity Parkinson's disease Constipation Diabetes Asthma Glaucoma Gout  Hypothyroidism   Risk Assessment/Risk Scores:      [place]  Heart Association (NYHA) Functional Class NYHA Class II, difficult to assess symptoms with baseline Parkinson's disease  CHA2DS2-VASc Score = 5   This indicates a 7.2% annual risk of stroke. The patient's score is based upon: CHF History: 1 HTN History: 1 Diabetes History: 1 Stroke History: 0 Vascular Disease History: 1 Age Score: 1 Gender Score: 0        For questions or updates, please contact [facility] Please consult [email or web address] for contact info under   Signed, Waddell DELENA Donath, PA-C  12/03/2023 1:31 PM

## 2023-12-03 NOTE — TOC Progression Note (Signed)
 Transition of Care [facility]) - Progression Note    Patient Details  Name: Henry [name] MRN: [id number] Date of Birth: 04-09-1950  [facility] CM/SW Contact  Andrez JULIANNA George, RN Phone Number: 12/03/2023, 1:13 PM  Clinical Narrative:     Pt is still requiring medical work up.  CIR and IP Care management following.  Expected Discharge Plan: IP Rehab Facility                 Expected Discharge Plan and Services                                               Social Drivers of Health (SDOH) Interventions SDOH Screenings   Food Insecurity: No Food Insecurity (11/28/2023)  Housing: Low Risk  (11/28/2023)  Transportation Needs: No Transportation Needs (11/28/2023)  Utilities: Not At Risk (11/28/2023)  Alcohol Screen: Low Risk  (04/05/2023)  Depression (PHQ2-9): Low Risk  (09/27/2023)  Financial Resource Strain: Low Risk  (04/05/2023)  Physical Activity: Inactive (04/05/2023)  Social Connections: Moderately Isolated (11/28/2023)  Stress: No Stress Concern Present (04/05/2023)  Tobacco Use: Low Risk  (11/28/2023)  Health Literacy: Inadequate Health Literacy (04/05/2023)    Readmission Risk Interventions     No data to display

## 2023-12-03 NOTE — Plan of Care (Signed)
  Problem: Education: Goal: Ability to describe self-care measures that may prevent or decrease complications (Diabetes Survival Skills Education) will improve Outcome: Progressing Goal: Individualized Educational Video(s) Outcome: Progressing   Problem: Coping: Goal: Ability to adjust to condition or change in health will improve Outcome: Progressing   Problem: Fluid Volume: Goal: Ability to maintain a balanced intake and output will improve Outcome: Progressing   Problem: Health Behavior/Discharge Planning: Goal: Ability to identify and utilize available resources and services will improve Outcome: Progressing Goal: Ability to manage health-related needs will improve Outcome: Progressing   Problem: Metabolic: Goal: Ability to maintain appropriate glucose levels will improve Outcome: Progressing   Problem: Nutritional: Goal: Maintenance of adequate nutrition will improve Outcome: Progressing Goal: Progress toward achieving an optimal weight will improve Outcome: Progressing   Problem: Skin Integrity: Goal: Risk for impaired skin integrity will decrease Outcome: Progressing   Problem: Tissue Perfusion: Goal: Adequacy of tissue perfusion will improve Outcome: Progressing   Problem: Education: Goal: Knowledge of General Education information will improve Description: Including pain rating scale, medication(s)/side effects and non-pharmacologic comfort measures Outcome: Progressing   Problem: Health Behavior/Discharge Planning: Goal: Ability to manage health-related needs will improve Outcome: Progressing   Problem: Clinical Measurements: Goal: Ability to maintain clinical measurements within normal limits will improve Outcome: Progressing Goal: Will remain free from infection Outcome: Progressing Goal: Diagnostic test results will improve Outcome: Progressing Goal: Respiratory complications will improve Outcome: Progressing Goal: Cardiovascular complication will  be avoided Outcome: Progressing   Problem: Activity: Goal: Risk for activity intolerance will decrease Outcome: Progressing   Problem: Nutrition: Goal: Adequate nutrition will be maintained Outcome: Progressing   Problem: Coping: Goal: Level of anxiety will decrease Outcome: Progressing   Problem: Elimination: Goal: Will not experience complications related to bowel motility Outcome: Progressing Goal: Will not experience complications related to urinary retention Outcome: Progressing   Problem: Pain Managment: Goal: General experience of comfort will improve and/or be controlled Outcome: Progressing   Problem: Safety: Goal: Ability to remain free from injury will improve Outcome: Progressing   Problem: Skin Integrity: Goal: Risk for impaired skin integrity will decrease Outcome: Progressing   Problem: Education: Goal: Knowledge of disease or condition will improve Outcome: Progressing Goal: Knowledge of secondary prevention will improve (MUST DOCUMENT ALL) Outcome: Progressing Goal: Knowledge of patient specific risk factors will improve (DELETE if not current risk factor) Outcome: Progressing

## 2023-12-03 NOTE — Progress Notes (Signed)
 Patient is more restless than usual. He is following commands, A&Ox3 and his VS are WNL except for RR at 22 and SPO2 at 91-92. His lung sounds are diminished with some wheezing and belly breathing. He does have a history of asthma and was given the PRN neb treatment a 2210 and started on [facility]. His last BM was 11/18 and his abdomen is firm with hypoactive bowel sounds. Dr. Lauri contacted via secure chat.  Chest and abd x-ray ordered.  Will continue to monitor.

## 2023-12-03 NOTE — Progress Notes (Signed)
 vLTM maintenance All impedances below 10k  No skin breakdown noted at  FZ CZ A1 A2

## 2023-12-03 NOTE — Care Management Important Message (Signed)
 Important Message  Patient Details  Name: Henry [name] MRN: [id number] Date of Birth: 1950/06/10   Important Message Given:  Yes - Medicare IM     Jennie Laneta Dragon 12/03/2023, 1:44 PM

## 2023-12-03 NOTE — Progress Notes (Signed)
 LTM maint complete - no skin breakdown seen. Serviced Fp1 F7 C3 Monitored by [employer].

## 2023-12-04 ENCOUNTER — Inpatient Hospital Stay (HOSPITAL_COMMUNITY)

## 2023-12-04 DIAGNOSIS — I5042 Chronic combined systolic (congestive) and diastolic (congestive) heart failure: Secondary | ICD-10-CM | POA: Diagnosis not present

## 2023-12-04 DIAGNOSIS — I255 Ischemic cardiomyopathy: Secondary | ICD-10-CM | POA: Diagnosis not present

## 2023-12-04 DIAGNOSIS — R569 Unspecified convulsions: Secondary | ICD-10-CM | POA: Diagnosis not present

## 2023-12-04 DIAGNOSIS — I480 Paroxysmal atrial fibrillation: Secondary | ICD-10-CM | POA: Diagnosis not present

## 2023-12-04 LAB — CBC
HCT: 41.8 % (ref 39.0–52.0)
Hemoglobin: 13.4 g/dL (ref 13.0–17.0)
MCH: 28.7 pg (ref 26.0–34.0)
MCHC: 32.1 g/dL (ref 30.0–36.0)
MCV: 89.5 fL (ref 80.0–100.0)
Platelets: 128 K/uL — ABNORMAL LOW (ref 150–400)
RBC: 4.67 MIL/uL (ref 4.22–5.81)
RDW: 14 % (ref 11.5–15.5)
WBC: 6.2 K/uL (ref 4.0–10.5)
nRBC: 0 % (ref 0.0–0.2)

## 2023-12-04 LAB — BASIC METABOLIC PANEL WITH GFR
Anion gap: 11 (ref 5–15)
BUN: 20 mg/dL (ref 8–23)
CO2: 30 mmol/L (ref 22–32)
Calcium: 8.5 mg/dL — ABNORMAL LOW (ref 8.9–10.3)
Chloride: 94 mmol/L — ABNORMAL LOW (ref 98–111)
Creatinine, Ser: 1.34 mg/dL — ABNORMAL HIGH (ref 0.61–1.24)
GFR, Estimated: 56 mL/min — ABNORMAL LOW (ref >60)
Glucose, Bld: 119 mg/dL — ABNORMAL HIGH (ref 70–99)
Potassium: 3.9 mmol/L (ref 3.5–5.1)
Sodium: 135 mmol/L (ref 135–145)

## 2023-12-04 LAB — GLUCOSE, CAPILLARY
Glucose-Capillary: 131 mg/dL — ABNORMAL HIGH (ref 70–99)
Glucose-Capillary: 126 mg/dL — ABNORMAL HIGH (ref 70–99)
Glucose-Capillary: 129 mg/dL — ABNORMAL HIGH (ref 70–99)
Glucose-Capillary: 137 mg/dL — ABNORMAL HIGH (ref 70–99)

## 2023-12-04 MED ORDER — LOSARTAN POTASSIUM 25 MG PO TABS
12.5000 mg | ORAL_TABLET | Freq: Every day | ORAL | Status: DC
  Administered 2023-12-04 – 2023-12-05 (×2): 12.5 mg via ORAL
  Filled 2023-12-04 (×2): qty 1

## 2023-12-04 NOTE — PMR Pre-admission (Signed)
 PMR Admission Coordinator Pre-Admission Assessment  Patient: Henry [name] is an 73 y.o., male MRN: [id number] DOB: 1950-09-12 Height: 5' 9 (175.3 cm) Weight: 117 kg  Insurance Information HMO:     PPO:      PCP:      IPA:      80/20:      OTHER:  PRIMARY: Medicare Part A and B      Policy#: [id number]       Subscriber: pt CM Name:       Phone#:      Fax#:  Pre-Cert#: verified Health And Safety Inspector:  Benefits:  Phone #:      Name:  Eff. Date: 09/10/07 A/B     Deduct: $1676      Out of Pocket Max: n/a      Life Max: n/a CIR: 100%      SNF: 20 full days Outpatient: 80%     Co-Pay: 20% Home Health: 100%      Co-Pay:  DME: 80%     Co-Pay: 20% Providers:  SECONDARY: [employer] PPO      Policy#: [id number]     Phone#: [phone]  Financial Counselor:       Phone#:   The "Data Collection Information Summary" for patients in Inpatient Rehabilitation Facilities with attached "Privacy Act Statement-Health Care Records" was provided and verbally reviewed with: Patient and Family  Emergency Contact Information Contact Information     Name Relation Home Work Mobile   Culpepper,[name] Spouse [phone]  [phone]   Cranford,Jennifer Daughter   [phone]      Other Contacts     Name Relation Home Work Mobile   Robinson,Glenda Daughter   [phone]       Current Medical History  Patient Admitting Diagnosis: Parkinson's exacerbation   History of Present Illness: Pt is a 73 y/o male with PMH of Parkinson's s/p deep brain stimulator, HFrEF, HTN, DM who presented to [facility] on 11/27/23 with c/o generalized weakness and increasing freezing episodes over the past 2-3 weeks.  His DBS was turned down at his neurologist's office on 11/13.  MRI recommended but DBS not compatible with MRI.  Neurology following.  Brain imaging negative for acute abnormality.  EEG negative.  Hospital course hypotension, constipation, acute on chronic heart failure, and PNA.  He was started on  IV lasix  and cardiology following for medication management.  Therapy ongoing and he has been recommended for CIR.   Complete NIHSS TOTAL: 3  Patient's medical record from [facility] has been reviewed by the rehabilitation admission coordinator and physician.  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   Dysrhythmia    a-fib   GERD (gastroesophageal reflux disease)    Glaucoma    right eye   History of kidney stones    multiple   HLD (hyperlipidemia)    HTN (hypertension)    pt denies 08/19/12  Hyperplasia, prostate    Hypothyroidism    MI (myocardial infarction) (HCC)    Dr Pietro 2000, x3vessels bypass   Neuromuscular disorder (HCC)    Parkinson's Disease   OSA (obstructive sleep apnea)    AHI-28,on CPAP, noncompliant with CPAP   Parkinson disease (HCC)    1999   PONV (postoperative nausea and vomiting)    Restless legs    Shortness of breath    Hx: of at all times   UTI (lower urinary tract infection) 09/15/2012   Klebsiella   Ventral hernia    Walker as ambulation aid    also uses wheelchair at home/when going out    Has the patient had major surgery during 100 days prior to admission? No  Family History   family history includes Alcoholism in his father; Aneurysm in his mother; Arthritis in an other family member; HIV/AIDS (age of onset: 52) in his brother; Healthy in his child, child, child, sister, and sister; Peripheral vascular disease in an other family member.  Current Medications  Current Facility-Administered Medications:     acetaminophen  (TYLENOL ) tablet 650 mg, 650 mg, Oral, Q6H PRN, 650 mg at 12/02/23 1322 **OR** acetaminophen  (TYLENOL ) suppository 650 mg, 650 mg, Rectal, Q6H PRN, Perri DELENA Meliton Mickey., MD   albuterol  (PROVENTIL ) (2.5 MG/3ML) 0.083% nebulizer solution 2.5 mg, 2.5 mg, Nebulization, Q4H PRN, Perri DELENA Meliton Mickey., MD, 2.5 mg at 12/04/23 9685   allopurinol  (ZYLOPRIM ) tablet 150 mg, 150 mg, Oral, Daily, Perri DELENA Meliton Mickey., MD, 150 mg at 12/04/23 9045   aspirin  EC tablet 81 mg, 81 mg, Oral, Daily, Perri DELENA Meliton Mickey., MD, 81 mg at 12/04/23 9046   Carbidopa -Levodopa  ER (SINEMET  CR) 25-100 MG tablet controlled release 2 tablet, 2 tablet, Oral, 5 X Daily, Perri DELENA Meliton Mickey., MD, 2 tablet at 12/04/23 9045   cholecalciferol  (VITAMIN D3) 25 MCG (1000 UNIT) tablet 2,000 Units, 2,000 Units, Oral, Daily, Perri DELENA Meliton Mickey., MD, 2,000 Units at 12/04/23 9045   cyanocobalamin  (VITAMIN B12) tablet 1,000 mcg, 1,000 mcg, Oral, Daily, Perri DELENA Meliton Mickey., MD, 1,000 mcg at 12/04/23 9045   dorzolamide -timolol  (COSOPT ) 2-0.5 % ophthalmic solution 1 drop, 1 drop, Right Eye, BID, Perri DELENA Meliton Mickey., MD, 1 drop at 12/04/23 9045   DULoxetine  (CYMBALTA ) DR capsule 20 mg, 20 mg, Oral, Daily, Perri DELENA Meliton Mickey., MD, 20 mg at 12/04/23 9045   enoxaparin  (LOVENOX ) injection 50 mg, 50 mg, Subcutaneous, Q24H, Perri DELENA Meliton Mickey., MD, 50 mg at 12/03/23 2240   fluticasone  furoate-vilanterol (BREO ELLIPTA ) 200-25 MCG/ACT 1 puff, 1 puff, Inhalation, Daily, Perri DELENA Meliton Mickey., MD, 1 puff at 12/04/23 9164   furosemide  (LASIX ) tablet 40 mg, 40 mg, Oral, Daily, Lonni Slain, MD, 40 mg at 12/04/23 9045   icosapent  Ethyl (VASCEPA ) 1 g capsule 2 g, 2 g, Oral, BID, Perri DELENA Meliton Mickey., MD, 2 g at 12/04/23 9045   insulin  aspart (novoLOG ) injection 0-5 Units, 0-5 Units, Subcutaneous, QHS, Powell, A Meliton Mickey., MD   insulin  aspart (novoLOG ) injection 0-9 Units, 0-9 Units, Subcutaneous, TID WC, Perri DELENA Meliton Mickey., MD, 1 Units at 12/04/23 1217   insulin  aspart (novoLOG ) injection 4 Units, 4 Units, Subcutaneous, TID WC, Perri DELENA Meliton Mickey., MD, 4 Units at 12/04/23 1217   insulin  glargine-yfgn (SEMGLEE ) injection 15 Units, 15 Units, Subcutaneous, QHS, Perri DELENA Meliton Mickey., MD, 15 Units at 12/03/23 2237   levothyroxine  (SYNTHROID ) tablet 150 mcg, 150 mcg, Oral, Q0600, Perri DELENA Meliton Mickey., MD, 150  mcg at 12/04/23 0650   melatonin tablet 3 mg, 3 mg, Oral, QHS PRN, Opyd, Timothy S, MD, 3 mg at 12/02/23 2307   metoprolol  succinate (TOPROL -XL) 24 hr tablet 25 mg, 25 mg, Oral, Daily, Lonni Slain, MD, 25 mg at 12/04/23 9045   mirabegron  ER (MYRBETRIQ ) tablet 25 mg, 25 mg, Oral, Daily, Perri DELENA Meliton Mickey., MD, 25 mg at 12/04/23 9046   Pimavanserin  Tartrate CAPS 34 mg, 34 mg, Oral, Daily, Perri DELENA Meliton Mickey., MD, 34 mg at 12/04/23 1056   polyethylene glycol (MIRALAX  / GLYCOLAX ) packet 8.5 g, 8.5 g, Oral, BID, Drusilla, Gagan S, MD, 8.5 g at 12/04/23 9046   rosuvastatin  (CRESTOR ) tablet 20 mg, 20 mg, Oral, Daily, Perri DELENA Meliton Mickey., MD, 20 mg at 12/03/23 2236   spironolactone  (ALDACTONE ) tablet 12.5 mg, 12.5 mg, Oral, Daily, Drusilla Sabas RAMAN, MD, 12.5 mg at 12/04/23 9046  Patients Current Diet:  Diet Order             Diet Carb Modified Fluid consistency: Thin; Room service appropriate? Yes  Diet effective now                   Precautions / Restrictions Precautions Precautions: Fall Restrictions Weight Bearing Restrictions Per Provider Order: No   Has the patient had 2 or more falls or a fall with injury in the past year? Yes  Prior Activity Level Limited Community (1-2x/wk): ambulates with rollator in community and supervision, min assist overall for ADLs at baseline (per spouse), dependent for iADLs  Prior Functional Level Self Care: Did the patient need help bathing, dressing, using the toilet or eating? Needed some help  Indoor Mobility: Did the patient need  assistance with walking from room to room (with or without device)? Needed some help  Stairs: Did the patient need assistance with internal or external stairs (with or without device)? Dependent  Functional Cognition: Did the patient need help planning regular tasks such as shopping or remembering to take medications? Dependent  Patient Information Are you of Hispanic, Latino/a,or Spanish origin?: A. No, not of Hispanic, Latino/a, or Spanish origin What is your race?: A. White Do you need or want an interpreter to communicate with a doctor or health care staff?: 0. No  Patient's Response To:  Health Literacy and Transportation Is the patient able to respond to health literacy and transportation needs?: Yes Health Literacy - How often do you need to have someone help you when you read instructions, pamphlets, or other written material from your doctor or pharmacy?: Never In the past 12 months, has lack of transportation kept you from medical appointments or from getting medications?: No In the past 12 months, has lack of transportation kept you from meetings, work, or from getting things needed for daily living?: No  Home Assistive Devices / Equipment Home Equipment: Agricultural Consultant (2 wheels), Other (comment), Grab bars - toilet, Grab bars - tub/shower, Shower seat - built in, Hand held shower head, Transport chair, Lift chair (U-step walker)  Prior Device Use: Indicate devices/aids used by the patient prior to current illness, exacerbation or injury? Walker  Current Functional Level Cognition  Orientation Level: Oriented X4    Extremity Assessment (includes Sensation/Coordination)  Upper Extremity Assessment: Defer to OT evaluation RUE Deficits / Details: mild tremor RUE Coordination: decreased fine motor, decreased gross motor LUE Deficits / Details: mild tremor LUE Coordination: decreased fine motor, decreased gross motor  Lower Extremity Assessment: Generalized weakness     ADLs  Overall  ADL's : Needs assistance/impaired Eating/Feeding: Maximal assistance, Bed level Grooming: Wash/dry hands, Wash/dry face, Contact guard assist, Sitting Grooming Details (indicate cue type and reason): on EOB Upper Body Bathing: Maximal assistance, Sitting Lower Body Bathing: Total assistance, +2 for physical assistance, Sit to/from stand Upper Body Dressing : Maximal assistance, Sitting Lower Body Dressing: Maximal assistance Lower Body Dressing Details (indicate cue type and reason): pt requires assist to get started with clothing over feet but increased abililty to transfer sit to stand this am.  Worked on letting go of walker with one hand to pull up pants with other hand in standing. Toilet Transfer: Moderate assistance, Rolling walker (2 wheels), BSC/3in1 Toilet Transfer Details (indicate cue type and reason): step pivot transfer to [facility] with mod assist for sit to stand and min assist for balance with cues for hand placement Toileting- Clothing Manipulation and Hygiene: Maximal assistance, Sit to/from stand, Cueing for compensatory techniques Toileting - Clothing Manipulation Details (indicate cue type and reason): pt working on pulling up pants with one hand while letting go with the other. Functional mobility during ADLs: Minimal assistance, +2 for physical assistance, Rolling walker (2 wheels) General ADL Comments: for anything more than transfer, feet two people is helpful to ambulate.    Mobility  Overal bed mobility: Needs Assistance Bed Mobility: Supine to Sit, Sit to Supine Supine to sit: Mod assist, HOB elevated, Max assist Sit to supine: Max assist General bed mobility comments: Assist for BLE management and trunk elevation. Increased BLE resistance to advancement once closer to EOB    Transfers  Overall transfer level: Needs assistance Equipment used: Rolling walker (2 wheels) Transfers: Sit to/from Stand, Bed to chair/wheelchair/BSC Sit to Stand: Mod assist,  From elevated surface Bed to/from chair/wheelchair/BSC transfer type:: Step pivot Step pivot transfers: Min assist General transfer comment: STS from EOB with mod A for power up. Facilitation of upright posture    Ambulation / Gait / Stairs / Wheelchair Mobility  Ambulation/Gait Ambulation/Gait assistance: Editor, Commissioning (Feet): 40 Feet Assistive device: Rolling walker (2 wheels) Gait Pattern/deviations: Shuffle, Trunk flexed, Decreased stride length, Step-through pattern General Gait Details: unable to progress due to poor posture, weakness, and fatigue Gait velocity: reduced Gait velocity interpretation: <1.31 ft/sec, indicative of household ambulator    Posture / Balance Dynamic Sitting Balance Sitting balance - Comments: sitting EOB Balance Overall balance assessment: Needs assistance Sitting-balance support: Feet supported, No upper extremity supported Sitting balance-Leahy Scale: Fair Sitting balance - Comments: sitting EOB Standing balance support: Bilateral upper extremity supported, Reliant on assistive device for balance, During functional activity Standing balance-Leahy Scale: Poor Standing balance comment: reliant on RW for support    Special considerations/life events  Oxygen intermittent o2 in acute setting and Special service needs Parkinson's   Previous Home Environment (from acute therapy documentation) Living Arrangements: Spouse/significant other, Children, Other relatives Available Help at Discharge: Family, Available 24 hours/day Type of Home: House Home Layout: One level Home Access: Ramped entrance Bathroom Shower/Tub: Health Visitor: Handicapped height Home Care Services: No  Discharge Living Setting Plans for Discharge Living Setting: Patient's home, Lives with (comment) (spouse and daughter) Type of Home at Discharge: House Discharge Home Layout: One level Discharge Home Access: Ramped entrance Discharge Bathroom  Shower/Tub: Walk-in shower Discharge Bathroom Toilet: Handicapped height Discharge Bathroom Accessibility: Yes How Accessible: Accessible via walker Does the patient have any problems obtaining your medications?: No  Social/Family/Support Systems Patient Roles: Spouse Contact Information: [phone] Niels Anticipated Caregiver: Niels and Delon (dtr)  [phone] Anticipated Caregiver's Contact Information: see above Ability/Limitations of Caregiver: none stated Caregiver Availability: 24/7 Discharge Plan Discussed with Primary Caregiver: Yes Is Caregiver In Agreement with Plan?: Yes Does Caregiver/Family have Issues with Lodging/Transportation while Pt is in Rehab?: No  Goals Patient/Family Goal for Rehab: PT supervision to CGA, OT min/mod assist, SLP supervision to min assist Expected length of stay: 14-16 days Additional Information: Discharge plan: home with spouse and daughter providing 24/7 supervision/assist as at baseline Pt/Family Agrees to Admission and willing to participate: Yes Program Orientation Provided & Reviewed with Pt/Caregiver Including Roles  & Responsibilities: Yes  Decrease burden of Care through IP rehab admission: n/a  Possible need for SNF placement upon discharge: Not anticipated.  Plan for discharge home with family support 24/7 as prior to admit.   Patient Condition: I have reviewed medical records from [facility], spoken with [facility] team, and patient, spouse, and daughter. I met with patient at the bedside for inpatient rehabilitation assessment.  Patient will benefit from ongoing PT, OT, and SLP, can actively participate in 3 hours of therapy a day 5 days of the week, and can make measurable gains during the admission.  Patient will also benefit from the coordinated team approach during an Inpatient Acute Rehabilitation admission.  The patient will receive intensive therapy as well as Rehabilitation physician, nursing, social worker, and care management  interventions.  Due to bladder management, bowel management, safety, skin/wound care, disease management, medication administration, pain management, and patient education the patient requires 24 hour a day rehabilitation nursing.  The patient is currently mod assist with mobility and basic ADLs.  Discharge setting and therapy post discharge at home with home health is anticipated.  Patient has agreed to participate in the Acute Inpatient Rehabilitation Program and will admit ***.  Preadmission Screen Completed By:  Reche FORBES Lowers, PT, DPT 12/04/2023 1:29 PM ______________________________________________________________________   Discussed status with Dr. PIERRETTE on *** at *** and received approval for admission today.  Admission Coordinator:  Caitlin E Warren, PT, time PIERRETTEPattricia ***   Assessment/Plan: Diagnosis: *** Does the need for close, 24 hr/day Medical supervision in concert with the patient's rehab needs make it unreasonable for this patient to be served in a less intensive setting? {yes_no_potentially:3041433} Co-Morbidities requiring supervision/potential complications: *** Due to {due un:6958565}, does the patient require 24 hr/day rehab nursing? {yes_no_potentially:3041433} Does the patient require coordinated care of a physician, rehab nurse, PT, OT, and SLP to address physical and functional deficits in the context of the above medical diagnosis(es)? {yes_no_potentially:3041433} Addressing deficits in the following areas: {deficits:3041436} Can the patient actively participate in an intensive therapy program of at least 3 hrs of therapy 5 days a week? {yes_no_potentially:3041433} The potential for patient to make measurable gains while on inpatient rehab is {potential:3041437} Anticipated functional outcomes upon discharge from inpatient rehab: {functional outcomes:304600100} PT, {functional outcomes:304600100} OT, {functional outcomes:304600100} SLP Estimated rehab length of stay to  reach the above functional goals is: *** Anticipated discharge destination: {anticipated dc setting:21604} 10. Overall Rehab/Functional Prognosis: {potential:3041437}   MD Signature: ***

## 2023-12-04 NOTE — Progress Notes (Signed)
 Inpatient Rehab Admissions Coordinator:   Met with pt, his daughter, and his spouse at bedside.  He is off LT EEG.  Agreeable to CIR when bed available.  Will follow.   Reche Lowers, PT, DPT Admissions Coordinator [phone] 12/04/23 1:26 PM

## 2023-12-04 NOTE — Plan of Care (Signed)
  Problem: Education: Goal: Ability to describe self-care measures that may prevent or decrease complications (Diabetes Survival Skills Education) will improve Outcome: Progressing Goal: Individualized Educational Video(s) Outcome: Progressing   Problem: Coping: Goal: Ability to adjust to condition or change in health will improve Outcome: Progressing   Problem: Fluid Volume: Goal: Ability to maintain a balanced intake and output will improve Outcome: Progressing   Problem: Health Behavior/Discharge Planning: Goal: Ability to identify and utilize available resources and services will improve Outcome: Progressing Goal: Ability to manage health-related needs will improve Outcome: Progressing   Problem: Metabolic: Goal: Ability to maintain appropriate glucose levels will improve Outcome: Progressing   Problem: Nutritional: Goal: Maintenance of adequate nutrition will improve Outcome: Progressing Goal: Progress toward achieving an optimal weight will improve Outcome: Progressing   Problem: Skin Integrity: Goal: Risk for impaired skin integrity will decrease Outcome: Progressing   Problem: Tissue Perfusion: Goal: Adequacy of tissue perfusion will improve Outcome: Progressing   Problem: Education: Goal: Knowledge of General Education information will improve Description: Including pain rating scale, medication(s)/side effects and non-pharmacologic comfort measures Outcome: Progressing   Problem: Health Behavior/Discharge Planning: Goal: Ability to manage health-related needs will improve Outcome: Progressing   Problem: Clinical Measurements: Goal: Ability to maintain clinical measurements within normal limits will improve Outcome: Progressing Goal: Will remain free from infection Outcome: Progressing Goal: Diagnostic test results will improve Outcome: Progressing Goal: Respiratory complications will improve Outcome: Progressing Goal: Cardiovascular complication will  be avoided Outcome: Progressing   Problem: Activity: Goal: Risk for activity intolerance will decrease Outcome: Progressing   Problem: Nutrition: Goal: Adequate nutrition will be maintained Outcome: Progressing   Problem: Coping: Goal: Level of anxiety will decrease Outcome: Progressing   Problem: Elimination: Goal: Will not experience complications related to bowel motility Outcome: Progressing Goal: Will not experience complications related to urinary retention Outcome: Progressing   Problem: Pain Managment: Goal: General experience of comfort will improve and/or be controlled Outcome: Progressing   Problem: Safety: Goal: Ability to remain free from injury will improve Outcome: Progressing   Problem: Skin Integrity: Goal: Risk for impaired skin integrity will decrease Outcome: Progressing   Problem: Education: Goal: Knowledge of disease or condition will improve Outcome: Progressing Goal: Knowledge of secondary prevention will improve (MUST DOCUMENT ALL) Outcome: Progressing Goal: Knowledge of patient specific risk factors will improve (DELETE if not current risk factor) Outcome: Progressing

## 2023-12-04 NOTE — Progress Notes (Signed)
 Rounding Note    Patient Name: Henry [name] Date of Encounter: 12/04/2023  [facility] Cardiologist: Redell Shallow, MD   Subjective   Wife and daughter at bedside. Patient more interactive today. Reports overall breathing has been stable except for episode overnight, used albuterol  inhaler and felt better. No shortness of breath currently at rest.  Inpatient Medications    Scheduled Meds:  allopurinol   150 mg Oral Daily   aspirin  EC  81 mg Oral Daily   Carbidopa -Levodopa  ER  2 tablet Oral 5 X Daily   cholecalciferol   2,000 Units Oral Daily   cyanocobalamin   1,000 mcg Oral Daily   dorzolamide -timolol   1 drop Right Eye BID   DULoxetine   20 mg Oral Daily   enoxaparin  (LOVENOX ) injection  50 mg Subcutaneous Q24H   fluticasone  furoate-vilanterol  1 puff Inhalation Daily   furosemide   40 mg Oral Daily   icosapent  Ethyl  2 g Oral BID   insulin  aspart  0-5 Units Subcutaneous QHS   insulin  aspart  0-9 Units Subcutaneous TID WC   insulin  aspart  4 Units Subcutaneous TID WC   insulin  glargine-yfgn  15 Units Subcutaneous QHS   levothyroxine   150 mcg Oral Q0600   losartan   12.5 mg Oral Daily   metoprolol  succinate  25 mg Oral Daily   mirabegron  ER  25 mg Oral Daily   Pimavanserin  Tartrate  34 mg Oral Daily   polyethylene glycol  8.5 g Oral BID   rosuvastatin   20 mg Oral Daily   spironolactone   12.5 mg Oral Daily   Continuous Infusions:  PRN Meds: acetaminophen  **OR** acetaminophen , albuterol , melatonin   Vital Signs    Vitals:   12/03/23 2030 12/04/23 0805 12/04/23 0837 12/04/23 1157  BP: 118/70 123/78  132/75  Pulse: 71 74 74 65  Resp: 18 19 19 16   Temp: 98.8 F (37.1 C) 98.3 F (36.8 C)  97.6 F (36.4 C)  TempSrc: Oral Oral  Oral  SpO2: 100% 93% 93% 94%  Weight:      Height:        Intake/Output Summary (Last 24 hours) at 12/04/2023 1459 Last data filed at 12/04/2023 1355 Gross per 24 hour  Intake --  Output 650 ml  Net -650 ml       11/28/2023   10:55 AM 11/22/2023    1:05 PM 11/14/2023    2:23 PM  Last 3 Weights  Weight (lbs) 258 lb 258 lb 258 lb 9.6 oz  Weight (kg) 117.028 kg 117.028 kg 117.3 kg      Telemetry    Not on telemetry - Personally Reviewed  Physical Exam   GEN: No acute distress.   Neck: JVD not well visualized Cardiac: RRR, no murmurs, rubs, or gallops.  Respiratory: Clear in upper fields, slightly diminished R base. GI: Soft, nontender, non-distended  MS: No edema; No deformity.  New pertinent results (labs, ECG, imaging, cardiac studies)     Assessment & Plan    Chronic systolic and diastolic heart failure Ischemic cardiomyopathy s/p ICD  -medications on hold for several days, and based on imaging and exam, he gradually become volume overloaded.   -he is back to his home dose of furosemide  40 mg oral daily, I/O not accurate, unable to determine response. Cr up slightly today, but within recent range -continue spironolactone  -restarted metoprolol  succinate today at 25 mg dose in the AM given heart rates, home dose 50 mg daily. Would keep at 25 mg daily dose as  heart rates (by chart) have been 60-70. Not on telemetry to track. -holding home losartan , BP slowly improving, will add 1/2 home dose today (12.5 mg final dose), monitor BP response. 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 Defer to primary team on final determination.    Signed, Shelda Bruckner, MD  12/04/2023, 2:59 PM

## 2023-12-04 NOTE — Procedures (Signed)
 Patient Name: Henry [name]  MRN: [id number]  Epilepsy Attending: Arlin MALVA Krebs  Referring Physician/Provider: Khaliqdina, Salman, MD  Duration: 12/03/2023 1116 to 12/04/2023 0843   Patient history: 73yo M with poor responsiveness and starring off unclear, coule be behavioral arrest. EEG to evaluate for seizure    Level of alertness: Awake, asleep   AEDs during EEG study: None   Technical aspects: This EEG study was done with scalp electrodes positioned according to the 10-20 International system of electrode placement. Electrical activity was reviewed with band pass filter of 1-70Hz , sensitivity of 7 uV/mm, display speed of 45mm/sec with a 60Hz  notched filter applied as appropriate. EEG data were recorded continuously and digitally stored.  Video monitoring was available and reviewed as appropriate.   Description: The posterior dominant rhythm consists of 8-9 Hz activity of moderate voltage (25-35 uV) seen predominantly in posterior head regions, symmetric and reactive to eye opening and eye closing. Sleep was characterized by vertex waves, sleep spindles (12 to 14 Hz), maximal frontocentral region.      IMPRESSION: This study is within normal limits. No seizures or epileptiform discharges were seen throughout the recording.   A normal interictal EEG does not exclude the diagnosis of epilepsy.     [name] O Duriel Deery

## 2023-12-04 NOTE — Progress Notes (Signed)
 [facility]  PROGRESS NOTE  Henry [name] FMW:[id number] DOB: 29-Mar-1950 DOA: 11/27/2023 PCP: Cleatus Arlyss RAMAN, MD   Brief HPI:    73 y.o. male with medical history significant of parkinson's disease s/p DBS, HFrEF, HTN, T2DM and multiple other medical issues here with generalized weakness.  Henry [name] notes his symptoms started 2-3 weeks ago. He has a longstanding history of parkinson's. Got a DBS placed in early 2000's. Typically able to walk with a walker, feed himself. He's noticed more freezing episodes in the past 2-3 weeks. More mobility issues. He saw his neurologist on 11/13 and at that time was noted to have right facial pulling. His DBS setting was changed at neurologist office. Symptoms became worse after change of setting of DBS.  Case was discussed with neurology who recommended admission and EEG/MRI and formal consult if concerns.     Assessment/Plan:   Generalized weakness in  setting of Parkinson disease - Usually able to walk with walker at baseline;  - Started having worsening freezing spells with progressive weakness - Was seen by neurology, at that time DBS turned off-device amplitude was turned down as a result - Patient symptoms got worse after that - CT head showed no acute abnormality - MRI brain not obtained as not compatible with patient's device - Neurology recommended EEG; which did not show up after home discharges -Continue Sinemet  -PT evaluation obtained; recommend inpatient rehab  ?  Seizure - Appreciate neurology input - EEG was unremarkable - Continuous EEG monitoring started, however no epileptic spells noted - Had 1 nonepileptic spell yesterday - No seizures noted on LTM EEG  Hypotension/?  Presyncope Blood pressure is improved after holding BP meds -check orthostatic vital signs - Patient's symptoms have improved after stopping his medications, doubt that patient mental status was from orthostatic  hypotension/presyncope -  Constipation - Seen on CT abdomen/pelvis - He is currently on MiraLAX  17 g daily - Will increase MiraLAX  to 17 g p.o. twice daily - 1 dose of Dulcolax suppository    Acute on chronic systolic heart failure -Patient now has mild tachypnea with mild leg swelling -Lasix  was held due to hypotension; will restart low-dose Lasix  20 mg p.o. daily due to soft blood pressure -Will follow BMP in am -Echocardiogram from 01/2042 showed EF of 30 to 35%, grade 2 diastolic 2 diastolic dysfunction - BNP was 11.4 - Started on Lasix  20 mg daily, will increase to 20 mg p.o. twice daily - Will restart Aldactone  12.5 mg daily - Continue to hold losartan , metoprolol  - Cardiology consulted; and home medications have been slowly restarted  Questionable pneumonia - Seen on CT chest; patient does have bilateral pulm pulmonary edema - He has a history of CHF - He is afebrile, normal vitals, not tachypneic - No concern for pneumonia, procalcitonin less than 0.10  Right Lower Extremity Swelling -Venous ultrasound shows no DVT   T2DM On insulin  pump at home Transition to basal/bolus and SSI while here - conservative dosing - adjust as needed CBG well-controlled    Dyslipidemia Crestor   Asthma Continue breathing treatments   Glaucoma Cosopt    Gout allopurinol        DVT prophylaxis: Lovenox   Medications     allopurinol   150 mg Oral Daily   aspirin  EC  81 mg Oral Daily   Carbidopa -Levodopa  ER  2 tablet Oral 5 X Daily   cholecalciferol   2,000 Units Oral Daily   cyanocobalamin   1,000 mcg Oral Daily   dorzolamide -timolol   1 drop Right  Eye BID   DULoxetine   20 mg Oral Daily   enoxaparin  (LOVENOX ) injection  50 mg Subcutaneous Q24H   fluticasone  furoate-vilanterol  1 puff Inhalation Daily   furosemide   40 mg Oral Daily   icosapent  Ethyl  2 g Oral BID   insulin  aspart  0-5 Units Subcutaneous QHS   insulin  aspart  0-9 Units Subcutaneous TID WC   insulin  aspart   4 Units Subcutaneous TID WC   insulin  glargine-yfgn  15 Units Subcutaneous QHS   levothyroxine   150 mcg Oral Q0600   losartan   12.5 mg Oral Daily   metoprolol  succinate  25 mg Oral Daily   mirabegron  ER  25 mg Oral Daily   Pimavanserin  Tartrate  34 mg Oral Daily   polyethylene glycol  8.5 g Oral BID   rosuvastatin   20 mg Oral Daily   spironolactone   12.5 mg Oral Daily     Data Reviewed:   CBG:  Recent Labs  Lab 12/03/23 1211 12/03/23 1631 12/03/23 2111 12/04/23 0609 12/04/23 1158  GLUCAP 158* 94 146* 131* 126*    SpO2: 94 % O2 Flow Rate (L/min): 1 L/min    Vitals:   12/03/23 2030 12/04/23 0805 12/04/23 0837 12/04/23 1157  BP: 118/70 123/78  132/75  Pulse: 71 74 74 65  Resp: 18 19 19 16   Temp: 98.8 F (37.1 C) 98.3 F (36.8 C)  97.6 F (36.4 C)  TempSrc: Oral Oral  Oral  SpO2: 100% 93% 93% 94%  Weight:      Height:          Data Reviewed:  Basic Metabolic Panel: Recent Labs  Lab 11/29/23 0404 11/30/23 1147 12/02/23 0431 12/03/23 0210 12/04/23 0533  NA 136 136 136 136 135  K 3.8 3.7 3.8 3.9 3.9  CL 96* 97* 97* 96* 94*  CO2 27 30 30 31 30   GLUCOSE 101* 124* 120* 102* 119*  BUN 25* 23 20 19 20   CREATININE 1.42* 1.30* 1.18 0.99 1.34*  CALCIUM  8.8* 8.7* 8.6* 8.7* 8.5*    CBC: Recent Labs  Lab 11/27/23 1545 11/27/23 2157 11/28/23 0305 11/29/23 0404 12/02/23 0431 12/04/23 0533  WBC 6.6 7.0 5.8 5.4 6.3 6.2  NEUTROABS 3.9  --   --   --   --   --   HGB 15.3 14.7 13.9 13.8 13.3 13.4  HCT 47.4 45.4 42.1 42.0 40.0 41.8  MCV 89.4 89.2 89.0 87.7 87.3 89.5  PLT 144* 146* 132* 132* 116* 128*    LFT Recent Labs  Lab 11/27/23 1545 11/28/23 0305 11/29/23 0404  AST 21 <10* <10*  ALT 5 6 <5  ALKPHOS 66 59 61  BILITOT 1.3* 1.0 1.1  PROT 6.5 6.4* 6.2*  ALBUMIN  3.9 3.7 3.5     Antibiotics: Anti-infectives (From admission, onward)    None        CONSULTS   Code Status: DNR  Family Communication: No family present at  bedside     Subjective    Denies shortness of breath.  Appreciate cardiology recommendations.  Cardiology has slowly restarted home medications for chronic systolic heart failure.  LTM EEG was negative for seizure.  Objective    Physical Examination:  Appears in no acute distress S1-S2, regular, no murmur auscultated Lungs clear to auscultation bilaterally Abdomen is soft, nontender Extremities trace edema in the lower extremities  Status is: Inpatient:             [name]   [facility] If 7PM-7AM, please  contact night-coverage at [email or web address], Office  [phone]   12/04/2023, 3:28 PM  LOS: 6 days

## 2023-12-04 NOTE — Progress Notes (Signed)
 LTM VIDEO EEG discontinued - no skin breakdown at [facility]. Some irritation at [facility]

## 2023-12-05 ENCOUNTER — Other Ambulatory Visit: Payer: Self-pay

## 2023-12-05 ENCOUNTER — Inpatient Hospital Stay (HOSPITAL_COMMUNITY)
Admission: AD | Admit: 2023-12-05 | Discharge: 2023-12-24 | DRG: 057 | Disposition: A | Source: Intra-hospital | Attending: Physical Medicine and Rehabilitation | Admitting: Physical Medicine and Rehabilitation

## 2023-12-05 ENCOUNTER — Encounter (HOSPITAL_COMMUNITY): Payer: Self-pay | Admitting: Internal Medicine

## 2023-12-05 DIAGNOSIS — Z7985 Long-term (current) use of injectable non-insulin antidiabetic drugs: Secondary | ICD-10-CM

## 2023-12-05 DIAGNOSIS — E66812 Obesity, class 2: Secondary | ICD-10-CM | POA: Diagnosis present

## 2023-12-05 DIAGNOSIS — E039 Hypothyroidism, unspecified: Secondary | ICD-10-CM | POA: Diagnosis present

## 2023-12-05 DIAGNOSIS — E1165 Type 2 diabetes mellitus with hyperglycemia: Secondary | ICD-10-CM | POA: Diagnosis present

## 2023-12-05 DIAGNOSIS — G20A1 Parkinson's disease without dyskinesia, without mention of fluctuations: Secondary | ICD-10-CM | POA: Diagnosis not present

## 2023-12-05 DIAGNOSIS — R0989 Other specified symptoms and signs involving the circulatory and respiratory systems: Secondary | ICD-10-CM | POA: Diagnosis not present

## 2023-12-05 DIAGNOSIS — N183 Chronic kidney disease, stage 3 unspecified: Secondary | ICD-10-CM | POA: Diagnosis present

## 2023-12-05 DIAGNOSIS — R404 Transient alteration of awareness: Secondary | ICD-10-CM | POA: Diagnosis not present

## 2023-12-05 DIAGNOSIS — G909 Disorder of the autonomic nervous system, unspecified: Secondary | ICD-10-CM | POA: Diagnosis present

## 2023-12-05 DIAGNOSIS — K219 Gastro-esophageal reflux disease without esophagitis: Secondary | ICD-10-CM | POA: Diagnosis present

## 2023-12-05 DIAGNOSIS — I480 Paroxysmal atrial fibrillation: Secondary | ICD-10-CM | POA: Diagnosis present

## 2023-12-05 DIAGNOSIS — J9601 Acute respiratory failure with hypoxia: Secondary | ICD-10-CM | POA: Diagnosis not present

## 2023-12-05 DIAGNOSIS — I130 Hypertensive heart and chronic kidney disease with heart failure and stage 1 through stage 4 chronic kidney disease, or unspecified chronic kidney disease: Secondary | ICD-10-CM | POA: Diagnosis present

## 2023-12-05 DIAGNOSIS — J9811 Atelectasis: Secondary | ICD-10-CM | POA: Diagnosis not present

## 2023-12-05 DIAGNOSIS — N179 Acute kidney failure, unspecified: Secondary | ICD-10-CM | POA: Diagnosis not present

## 2023-12-05 DIAGNOSIS — H409 Unspecified glaucoma: Secondary | ICD-10-CM | POA: Diagnosis present

## 2023-12-05 DIAGNOSIS — E1140 Type 2 diabetes mellitus with diabetic neuropathy, unspecified: Secondary | ICD-10-CM | POA: Diagnosis present

## 2023-12-05 DIAGNOSIS — Z79899 Other long term (current) drug therapy: Secondary | ICD-10-CM

## 2023-12-05 DIAGNOSIS — Z6837 Body mass index (BMI) 37.0-37.9, adult: Secondary | ICD-10-CM

## 2023-12-05 DIAGNOSIS — R531 Weakness: Secondary | ICD-10-CM | POA: Diagnosis present

## 2023-12-05 DIAGNOSIS — Z95810 Presence of automatic (implantable) cardiac defibrillator: Secondary | ICD-10-CM | POA: Diagnosis not present

## 2023-12-05 DIAGNOSIS — Z8249 Family history of ischemic heart disease and other diseases of the circulatory system: Secondary | ICD-10-CM

## 2023-12-05 DIAGNOSIS — J441 Chronic obstructive pulmonary disease with (acute) exacerbation: Secondary | ICD-10-CM | POA: Diagnosis not present

## 2023-12-05 DIAGNOSIS — E119 Type 2 diabetes mellitus without complications: Secondary | ICD-10-CM | POA: Diagnosis not present

## 2023-12-05 DIAGNOSIS — R0603 Acute respiratory distress: Secondary | ICD-10-CM | POA: Diagnosis not present

## 2023-12-05 DIAGNOSIS — Z91010 Allergy to peanuts: Secondary | ICD-10-CM

## 2023-12-05 DIAGNOSIS — Z955 Presence of coronary angioplasty implant and graft: Secondary | ICD-10-CM

## 2023-12-05 DIAGNOSIS — R29818 Other symptoms and signs involving the nervous system: Secondary | ICD-10-CM | POA: Diagnosis not present

## 2023-12-05 DIAGNOSIS — G629 Polyneuropathy, unspecified: Secondary | ICD-10-CM

## 2023-12-05 DIAGNOSIS — F410 Panic disorder [episodic paroxysmal anxiety] without agoraphobia: Secondary | ICD-10-CM | POA: Diagnosis present

## 2023-12-05 DIAGNOSIS — J811 Chronic pulmonary edema: Secondary | ICD-10-CM | POA: Diagnosis not present

## 2023-12-05 DIAGNOSIS — K5900 Constipation, unspecified: Secondary | ICD-10-CM

## 2023-12-05 DIAGNOSIS — I2510 Atherosclerotic heart disease of native coronary artery without angina pectoris: Secondary | ICD-10-CM | POA: Diagnosis present

## 2023-12-05 DIAGNOSIS — B379 Candidiasis, unspecified: Secondary | ICD-10-CM | POA: Diagnosis present

## 2023-12-05 DIAGNOSIS — Z79890 Hormone replacement therapy (postmenopausal): Secondary | ICD-10-CM

## 2023-12-05 DIAGNOSIS — D696 Thrombocytopenia, unspecified: Secondary | ICD-10-CM | POA: Diagnosis present

## 2023-12-05 DIAGNOSIS — Z1152 Encounter for screening for COVID-19: Secondary | ICD-10-CM

## 2023-12-05 DIAGNOSIS — M109 Gout, unspecified: Secondary | ICD-10-CM | POA: Diagnosis present

## 2023-12-05 DIAGNOSIS — J45909 Unspecified asthma, uncomplicated: Secondary | ICD-10-CM | POA: Diagnosis present

## 2023-12-05 DIAGNOSIS — I5042 Chronic combined systolic (congestive) and diastolic (congestive) heart failure: Secondary | ICD-10-CM | POA: Diagnosis present

## 2023-12-05 DIAGNOSIS — I255 Ischemic cardiomyopathy: Secondary | ICD-10-CM | POA: Diagnosis not present

## 2023-12-05 DIAGNOSIS — R443 Hallucinations, unspecified: Secondary | ICD-10-CM | POA: Diagnosis present

## 2023-12-05 DIAGNOSIS — Z66 Do not resuscitate: Secondary | ICD-10-CM | POA: Diagnosis not present

## 2023-12-05 DIAGNOSIS — Z794 Long term (current) use of insulin: Secondary | ICD-10-CM | POA: Diagnosis not present

## 2023-12-05 DIAGNOSIS — Z515 Encounter for palliative care: Secondary | ICD-10-CM | POA: Diagnosis not present

## 2023-12-05 DIAGNOSIS — Z880 Allergy status to penicillin: Secondary | ICD-10-CM

## 2023-12-05 DIAGNOSIS — I10 Essential (primary) hypertension: Secondary | ICD-10-CM | POA: Diagnosis not present

## 2023-12-05 DIAGNOSIS — R471 Dysarthria and anarthria: Secondary | ICD-10-CM | POA: Diagnosis not present

## 2023-12-05 DIAGNOSIS — G2581 Restless legs syndrome: Secondary | ICD-10-CM | POA: Diagnosis present

## 2023-12-05 DIAGNOSIS — G20B1 Parkinson's disease with dyskinesia, without mention of fluctuations: Principal | ICD-10-CM | POA: Diagnosis present

## 2023-12-05 DIAGNOSIS — J90 Pleural effusion, not elsewhere classified: Secondary | ICD-10-CM | POA: Diagnosis not present

## 2023-12-05 DIAGNOSIS — Z7982 Long term (current) use of aspirin: Secondary | ICD-10-CM

## 2023-12-05 DIAGNOSIS — E785 Hyperlipidemia, unspecified: Secondary | ICD-10-CM | POA: Diagnosis present

## 2023-12-05 DIAGNOSIS — Z711 Person with feared health complaint in whom no diagnosis is made: Secondary | ICD-10-CM | POA: Diagnosis not present

## 2023-12-05 DIAGNOSIS — Z9841 Cataract extraction status, right eye: Secondary | ICD-10-CM

## 2023-12-05 DIAGNOSIS — G4733 Obstructive sleep apnea (adult) (pediatric): Secondary | ICD-10-CM

## 2023-12-05 DIAGNOSIS — J45901 Unspecified asthma with (acute) exacerbation: Secondary | ICD-10-CM | POA: Diagnosis not present

## 2023-12-05 DIAGNOSIS — Z961 Presence of intraocular lens: Secondary | ICD-10-CM | POA: Diagnosis present

## 2023-12-05 DIAGNOSIS — I5020 Unspecified systolic (congestive) heart failure: Secondary | ICD-10-CM

## 2023-12-05 DIAGNOSIS — Z89022 Acquired absence of left finger(s): Secondary | ICD-10-CM

## 2023-12-05 DIAGNOSIS — Z830 Family history of human immunodeficiency virus [HIV] disease: Secondary | ICD-10-CM

## 2023-12-05 DIAGNOSIS — R0902 Hypoxemia: Secondary | ICD-10-CM | POA: Diagnosis not present

## 2023-12-05 DIAGNOSIS — I517 Cardiomegaly: Secondary | ICD-10-CM | POA: Diagnosis not present

## 2023-12-05 DIAGNOSIS — H9190 Unspecified hearing loss, unspecified ear: Secondary | ICD-10-CM | POA: Diagnosis present

## 2023-12-05 DIAGNOSIS — Z7189 Other specified counseling: Secondary | ICD-10-CM | POA: Diagnosis not present

## 2023-12-05 DIAGNOSIS — I252 Old myocardial infarction: Secondary | ICD-10-CM

## 2023-12-05 DIAGNOSIS — Z951 Presence of aortocoronary bypass graft: Secondary | ICD-10-CM

## 2023-12-05 DIAGNOSIS — N1831 Chronic kidney disease, stage 3a: Secondary | ICD-10-CM | POA: Diagnosis not present

## 2023-12-05 DIAGNOSIS — E1122 Type 2 diabetes mellitus with diabetic chronic kidney disease: Secondary | ICD-10-CM

## 2023-12-05 DIAGNOSIS — Z9842 Cataract extraction status, left eye: Secondary | ICD-10-CM

## 2023-12-05 DIAGNOSIS — I5022 Chronic systolic (congestive) heart failure: Secondary | ICD-10-CM | POA: Diagnosis present

## 2023-12-05 DIAGNOSIS — R339 Retention of urine, unspecified: Secondary | ICD-10-CM | POA: Diagnosis present

## 2023-12-05 LAB — BASIC METABOLIC PANEL WITH GFR
Anion gap: 9 (ref 5–15)
BUN: 19 mg/dL (ref 8–23)
CO2: 35 mmol/L — ABNORMAL HIGH (ref 22–32)
Calcium: 8.5 mg/dL — ABNORMAL LOW (ref 8.9–10.3)
Chloride: 94 mmol/L — ABNORMAL LOW (ref 98–111)
Creatinine, Ser: 1.27 mg/dL — ABNORMAL HIGH (ref 0.61–1.24)
GFR, Estimated: >60 mL/min (ref >60)
Glucose, Bld: 114 mg/dL — ABNORMAL HIGH (ref 70–99)
Potassium: 4 mmol/L (ref 3.5–5.1)
Sodium: 138 mmol/L (ref 135–145)

## 2023-12-05 LAB — GLUCOSE, CAPILLARY
Glucose-Capillary: 126 mg/dL — ABNORMAL HIGH (ref 70–99)
Glucose-Capillary: 145 mg/dL — ABNORMAL HIGH (ref 70–99)
Glucose-Capillary: 120 mg/dL — ABNORMAL HIGH (ref 70–99)
Glucose-Capillary: 128 mg/dL — ABNORMAL HIGH (ref 70–99)

## 2023-12-05 MED ORDER — SPIRONOLACTONE 12.5 MG HALF TABLET
12.5000 mg | ORAL_TABLET | Freq: Every day | ORAL | Status: DC
  Administered 2023-12-06 – 2023-12-24 (×19): 12.5 mg via ORAL
  Filled 2023-12-05 (×19): qty 1

## 2023-12-05 MED ORDER — CARBIDOPA-LEVODOPA ER 25-100 MG PO TBCR
2.0000 | EXTENDED_RELEASE_TABLET | Freq: Every day | ORAL | Status: DC
  Administered 2023-12-05 – 2023-12-07 (×7): 2 via ORAL
  Filled 2023-12-05 (×10): qty 2

## 2023-12-05 MED ORDER — INSULIN GLARGINE-YFGN 100 UNIT/ML ~~LOC~~ SOLN
15.0000 [IU] | Freq: Every day | SUBCUTANEOUS | Status: DC
  Administered 2023-12-05 – 2023-12-16 (×12): 15 [IU] via SUBCUTANEOUS
  Filled 2023-12-05 (×14): qty 0.15

## 2023-12-05 MED ORDER — VITAMIN D 25 MCG (1000 UNIT) PO TABS
2000.0000 [IU] | ORAL_TABLET | Freq: Every day | ORAL | Status: DC
  Administered 2023-12-06 – 2023-12-13 (×8): 2000 [IU] via ORAL
  Filled 2023-12-05 (×8): qty 2

## 2023-12-05 MED ORDER — INSULIN ASPART 100 UNIT/ML IJ SOLN: 0.0000 [IU] | Freq: Three times a day (TID) | INTRAMUSCULAR | Status: DC

## 2023-12-05 MED ORDER — ACETAMINOPHEN 325 MG PO TABS: 325.0000 mg | ORAL_TABLET | ORAL | Status: DC | PRN

## 2023-12-05 MED ORDER — DIPHENHYDRAMINE HCL 25 MG PO CAPS
25.0000 mg | ORAL_CAPSULE | Freq: Four times a day (QID) | ORAL | Status: DC | PRN
  Filled 2023-12-05: qty 1

## 2023-12-05 MED ORDER — INSULIN ASPART 100 UNIT/ML IJ SOLN
4.0000 [IU] | Freq: Three times a day (TID) | INTRAMUSCULAR | Status: DC
  Administered 2023-12-06 – 2023-12-11 (×15): 4 [IU] via SUBCUTANEOUS
  Filled 2023-12-05 (×15): qty 4

## 2023-12-05 MED ORDER — NYSTATIN 100000 UNIT/ML MT SUSP
5.0000 mL | Freq: Four times a day (QID) | OROMUCOSAL | Status: DC
  Administered 2023-12-05 – 2023-12-19 (×49): 500000 [IU] via ORAL
  Filled 2023-12-05 (×51): qty 5

## 2023-12-05 MED ORDER — LIDOCAINE HCL URETHRAL/MUCOSAL 2 % EX GEL: CUTANEOUS | Status: DC | PRN

## 2023-12-05 MED ORDER — INSULIN GLARGINE-YFGN 100 UNIT/ML ~~LOC~~ SOLN: 15.0000 [IU] | Freq: Every day | SUBCUTANEOUS | Status: DC

## 2023-12-05 MED ORDER — FUROSEMIDE 40 MG PO TABS
40.0000 mg | ORAL_TABLET | Freq: Every day | ORAL | Status: DC
  Administered 2023-12-06 – 2023-12-10 (×5): 40 mg via ORAL
  Filled 2023-12-05 (×5): qty 1

## 2023-12-05 MED ORDER — ROSUVASTATIN CALCIUM 20 MG PO TABS
20.0000 mg | ORAL_TABLET | Freq: Every day | ORAL | Status: DC
  Administered 2023-12-05 – 2023-12-23 (×18): 20 mg via ORAL
  Filled 2023-12-05 (×19): qty 1

## 2023-12-05 MED ORDER — ALBUTEROL SULFATE (2.5 MG/3ML) 0.083% IN NEBU
2.5000 mg | INHALATION_SOLUTION | RESPIRATORY_TRACT | Status: DC | PRN
  Administered 2023-12-06 – 2023-12-17 (×4): 2.5 mg via RESPIRATORY_TRACT
  Filled 2023-12-05 (×4): qty 3

## 2023-12-05 MED ORDER — METOPROLOL SUCCINATE ER 25 MG PO TB24: 25.0000 mg | ORAL_TABLET | Freq: Every day | ORAL | Status: DC

## 2023-12-05 MED ORDER — ALLOPURINOL 100 MG PO TABS
150.0000 mg | ORAL_TABLET | Freq: Every day | ORAL | Status: DC
  Administered 2023-12-06 – 2023-12-24 (×19): 150 mg via ORAL
  Filled 2023-12-05: qty 1.5
  Filled 2023-12-05 (×9): qty 2
  Filled 2023-12-05: qty 1.5
  Filled 2023-12-05 (×8): qty 2

## 2023-12-05 MED ORDER — ENOXAPARIN SODIUM 60 MG/0.6ML IJ SOSY: 50.0000 mg | PREFILLED_SYRINGE | INTRAMUSCULAR | Status: DC

## 2023-12-05 MED ORDER — FLEET ENEMA RE ENEM
1.0000 | ENEMA | Freq: Once | RECTAL | Status: AC | PRN
Start: 2023-12-05 — End: ?

## 2023-12-05 MED ORDER — ACETAMINOPHEN 325 MG PO TABS: 650.0000 mg | ORAL_TABLET | Freq: Four times a day (QID) | ORAL | Status: DC | PRN

## 2023-12-05 MED ORDER — INSULIN ASPART 100 UNIT/ML IJ SOLN: 4.0000 [IU] | Freq: Three times a day (TID) | INTRAMUSCULAR | Status: DC

## 2023-12-05 MED ORDER — ASPIRIN 81 MG PO TBEC
81.0000 mg | DELAYED_RELEASE_TABLET | Freq: Every day | ORAL | Status: DC
  Administered 2023-12-06 – 2023-12-24 (×19): 81 mg via ORAL
  Filled 2023-12-05 (×19): qty 1

## 2023-12-05 MED ORDER — ALUM & MAG HYDROXIDE-SIMETH 200-200-20 MG/5ML PO SUSP
30.0000 mL | ORAL | Status: AC | PRN
Start: 2023-12-05 — End: ?

## 2023-12-05 MED ORDER — VITAMIN B-12 1000 MCG PO TABS
1000.0000 ug | ORAL_TABLET | Freq: Every day | ORAL | Status: DC
  Administered 2023-12-06: 1000 ug via ORAL
  Filled 2023-12-05: qty 1

## 2023-12-05 MED ORDER — DORZOLAMIDE HCL-TIMOLOL MAL 2-0.5 % OP SOLN
1.0000 [drp] | Freq: Two times a day (BID) | OPHTHALMIC | Status: DC
  Administered 2023-12-05 – 2023-12-24 (×38): 1 [drp] via OPHTHALMIC
  Filled 2023-12-05: qty 10

## 2023-12-05 MED ORDER — PIMAVANSERIN TARTRATE 34 MG PO CAPS
34.0000 mg | ORAL_CAPSULE | Freq: Every day | ORAL | Status: DC
  Administered 2023-12-06 – 2023-12-24 (×19): 34 mg via ORAL
  Filled 2023-12-05 (×24): qty 1

## 2023-12-05 MED ORDER — FUROSEMIDE 20 MG PO TABS: 40.0000 mg | ORAL_TABLET | Freq: Every day | ORAL | Status: DC

## 2023-12-05 MED ORDER — PROCHLORPERAZINE 25 MG RE SUPP: 12.5000 mg | Freq: Four times a day (QID) | RECTAL | Status: DC | PRN

## 2023-12-05 MED ORDER — MELATONIN 3 MG PO TABS: 3.0000 mg | ORAL_TABLET | Freq: Every evening | ORAL | Status: DC | PRN

## 2023-12-05 MED ORDER — LOSARTAN POTASSIUM 25 MG PO TABS
12.5000 mg | ORAL_TABLET | Freq: Every day | ORAL | Status: AC
Start: 2023-12-05 — End: ?

## 2023-12-05 MED ORDER — LEVOTHYROXINE SODIUM 75 MCG PO TABS
150.0000 ug | ORAL_TABLET | Freq: Every day | ORAL | Status: DC
  Administered 2023-12-06 – 2023-12-24 (×19): 150 ug via ORAL
  Filled 2023-12-05 (×19): qty 2

## 2023-12-05 MED ORDER — BISACODYL 10 MG RE SUPP: 10.0000 mg | Freq: Every day | RECTAL | Status: DC | PRN

## 2023-12-05 MED ORDER — INSULIN ASPART 100 UNIT/ML IJ SOLN: 0.0000 [IU] | Freq: Every day | INTRAMUSCULAR | Status: DC

## 2023-12-05 MED ORDER — DULOXETINE HCL 20 MG PO CPEP
20.0000 mg | ORAL_CAPSULE | Freq: Every day | ORAL | Status: DC
  Filled 2023-12-05 (×2): qty 1

## 2023-12-05 MED ORDER — PROCHLORPERAZINE EDISYLATE 10 MG/2ML IJ SOLN: 5.0000 mg | Freq: Four times a day (QID) | INTRAMUSCULAR | Status: DC | PRN

## 2023-12-05 MED ORDER — ENOXAPARIN SODIUM 40 MG/0.4ML IJ SOSY
40.0000 mg | PREFILLED_SYRINGE | INTRAMUSCULAR | Status: DC
  Administered 2023-12-05 – 2023-12-17 (×13): 40 mg via SUBCUTANEOUS
  Filled 2023-12-05 (×13): qty 0.4

## 2023-12-05 MED ORDER — METOPROLOL SUCCINATE ER 25 MG PO TB24
25.0000 mg | ORAL_TABLET | Freq: Every day | ORAL | Status: DC
  Administered 2023-12-06 – 2023-12-24 (×19): 25 mg via ORAL
  Filled 2023-12-05 (×19): qty 1

## 2023-12-05 MED ORDER — PROCHLORPERAZINE MALEATE 5 MG PO TABS: 5.0000 mg | ORAL_TABLET | Freq: Four times a day (QID) | ORAL | Status: DC | PRN

## 2023-12-05 MED ORDER — ICOSAPENT ETHYL 1 G PO CAPS
2.0000 g | ORAL_CAPSULE | Freq: Two times a day (BID) | ORAL | Status: DC
  Administered 2023-12-05 – 2023-12-24 (×37): 2 g via ORAL
  Filled 2023-12-05 (×43): qty 2

## 2023-12-05 MED ORDER — DULOXETINE HCL 20 MG PO CPEP: 20.0000 mg | ORAL_CAPSULE | Freq: Every day | ORAL | Status: DC

## 2023-12-05 MED ORDER — LOSARTAN POTASSIUM 25 MG PO TABS
12.5000 mg | ORAL_TABLET | Freq: Every day | ORAL | Status: DC
  Administered 2023-12-06 – 2023-12-18 (×13): 12.5 mg via ORAL
  Filled 2023-12-05 (×13): qty 1

## 2023-12-05 MED ORDER — FLUTICASONE FUROATE-VILANTEROL 200-25 MCG/ACT IN AEPB
1.0000 | INHALATION_SPRAY | Freq: Every day | RESPIRATORY_TRACT | Status: DC
  Administered 2023-12-06 – 2023-12-21 (×13): 1 via RESPIRATORY_TRACT
  Filled 2023-12-05 (×2): qty 28

## 2023-12-05 MED ORDER — GUAIFENESIN-DM 100-10 MG/5ML PO SYRP: 5.0000 mL | ORAL_SOLUTION | Freq: Four times a day (QID) | ORAL | Status: DC | PRN

## 2023-12-05 MED ORDER — MELATONIN 5 MG PO TABS
5.0000 mg | ORAL_TABLET | Freq: Every evening | ORAL | Status: DC | PRN
  Administered 2023-12-09 – 2023-12-20 (×11): 5 mg via ORAL
  Filled 2023-12-05 (×12): qty 1

## 2023-12-05 MED ORDER — POLYETHYLENE GLYCOL 3350 17 G PO PACK
8.5000 g | PACK | Freq: Two times a day (BID) | ORAL | Status: DC
  Administered 2023-12-06 – 2023-12-10 (×8): 8.5 g via ORAL
  Filled 2023-12-05 (×10): qty 1

## 2023-12-05 NOTE — Progress Notes (Signed)
 Inpatient Rehab Admissions Coordinator:    I have a bed available for pt to admit to CIR today. Dr. Sonjia in agreement and [facility] aware.  I will notify pt/family and make arrangements.    Reche Lowers, PT, DPT Admissions Coordinator [phone] 12/05/23 11:17 AM

## 2023-12-05 NOTE — Discharge Summary (Signed)
 Physician Discharge Summary  Henry [name] FMW:[id number] DOB: 1950/01/28 DOA: 11/27/2023  PCP: Cleatus Arlyss RAMAN, MD  Admit date: 11/27/2023 Discharge date: 12/05/2023  Admitted From: Home  Discharge disposition: CIR   Recommendations for Outpatient Follow-Up:   Follow-up with neurology as outpatient.  Discharge Diagnosis:   Principal Problem:   Weakness Active Problems:   Parkinson disease (HCC)   Seizure (HCC)   Discharge Condition: Improved.  Diet recommendation:   Carbohydrate-modified.  Wound care: None.  Code status: DNR   History of Present Illness:   73 y.o. male with medical history significant of parkinson's disease status post deep brain stimulator, HFrEF, HTN, T2DM  presented to hospital with generalized weakness for the last 2 to 3 weeks.  History of Parkinson's disease and had deep brain stimulator placed in the early 2000.  Patient is normally able to walk with a walker and feed himself but has been having more freezing episodes in the last 2 to 3 weeks.  He had seen his neurologist on 11/22/2023 and was noted to have right facial pulling. His DBS setting was changed at neurologist office.  Symptoms have become worse after changing setting of DBS.  Neurology was consulted and patient was admitted hospital for further evaluation and treatment.  Hospital Course:   Following conditions were addressed during hospitalization as listed below,  Generalized weakness in  setting of Parkinson disease At baseline, patient ambulates with the help of walker but had been having worsening weakness and freezing spells.  Seen by neurology in the past and DBS was turned off after which his symptoms have worsened.  CT head was unremarkable.  Unable to obtain MRI of the brain due to device.  Continue Sinemet .  PT recommends inpatient rehab   Question seizure Neurology was consulted.  EEG without any seizures.   Hypotension with presyncope. Improved after holding  antihypertensives.  Antihypertensives have been restarted at a lower dose at this time.  Constipation Continue bowel regimen to ensure adequate bowel movements    Acute on chronic systolic heart failure Had tachypnea with leg swelling.  Review of 2D echocardiogram from-Echocardiogram from 01/2022 showed EF of 30 to 35%, grade 2 diastolic 2 diastolic dysfunction.  BNP on presentation was 11.4.  Was on Lasix  20 daily has been changed to 40 mg daily..  Aldactone  has been restarted at 12.5 mg, losartan  dose has been adjusted to 12.5 milligram and metoprolol  to 25 milligram long-acting..    Cardiology for patient during hospitalization.  Latest blood pressure of 126/67.  Abnormal CT chest scan. Seen on CT scan.  History of CHF.  Procalcitonin less than 0.1.  Afebrile.  Pneumonia has been ruled out.   Right Lower Extremity Swelling -Venous duplex ultrasound shows no DVT   Diabetes mellitus type 2.  Insulin -dependent.  On insulin  pump at home.  Has been transitioned to basal bolus and sliding scale while in the hospital.  Glycemic control adequate at this time.  Dyslipidemia Continue Crestor   Asthma Continue bronchodilators.  Glaucoma Continue Cosopt    Gout Continue allopurinol   History of Parkinson's disease.  Likely has progression of Parkinson's disease.  Continue Sinemet .  Physical therapy recommends CIR  Debility deconditioning.  Patient has been seen by physical therapy and recommend CIR at this time.   Disposition.  At this time, patient is stable for disposition to CIR.  Spoke with the patient's spouse and daughter at bedside prior to disposition  Medical Consultants:   Neurology Cardiology  Procedures:    EEG  Subjective:   Today, patient was seen and examined at bedside.  Patient's family at bedside.  More alert awake and Communicative but difficult to understand some speech.  Answering appropriately.  Patient stated that he had not slept well  Discharge Exam:    Vitals:   12/05/23 0358 12/05/23 0831  BP: 126/67 116/68  Pulse: 66 67  Resp: 17 18  Temp: 97.8 F (36.6 C) 97.9 F (36.6 C)  SpO2: 99% 91%   Vitals:   12/05/23 0001 12/05/23 0358 12/05/23 0500 12/05/23 0831  BP: 122/74 126/67  116/68  Pulse: 62 66  67  Resp: 17 17  18   Temp: 98.3 F (36.8 C) 97.8 F (36.6 C)  97.9 F (36.6 C)  TempSrc: Oral Oral  Oral  SpO2: 95% 99%  91%  Weight:   115 kg   Height:       Body mass index is 37.44 kg/m.  General: Alert awake, not in obvious distress obese built HENT: pupils equally reacting to light,  No scleral pallor or icterus noted. Oral mucosa is moist.  Chest: Diminished breath sounds bilaterally.  No crackles or wheezes.  CVS: S1 &S2 heard. No murmur.  Regular rate and rhythm. Abdomen: Soft, nontender, nondistended.  Bowel sounds are heard.   Extremities: No cyanosis, clubbing trace lower extremity edema peripheral pulses are palpable. Psych: Alert, awake and Communicative, flat facies, CNS:  No cranial nerve deficits.  Moves extremities, flat facies, indistinct speech, Skin: Warm and dry.  No rashes noted.  The results of significant diagnostics from this hospitalization (including imaging, microbiology, ancillary and laboratory) are listed below for reference.     Diagnostic Studies:   VAS US  LOWER EXTREMITY VENOUS (DVT) Result Date: 11/28/2023  Lower Venous DVT Study Patient Name:  Henry [name]  Date of Exam:   11/28/2023 Medical Rec #: [id number]         Accession #:    [id number] Date of Birth: 1950-11-16         Patient Gender: M Patient Age:   73 years Exam Location:  [facility] Procedure:      VAS US  LOWER EXTREMITY VENOUS (DVT) Referring Phys: A POWELL JR --------------------------------------------------------------------------------  Indications: Swelling, and Mobility issues.  Risk Factors: Parkinson's disease, T2DM. Comparison Study: No recent studies.                   02/18/2017 - Negative right leg  Performing Technologist: Ricka Sturdivant-Jones RDMS, RVT  Examination Guidelines: A complete evaluation includes B-mode imaging, spectral Doppler, color Doppler, and power Doppler as needed of all accessible portions of each vessel. Bilateral testing is considered an integral part of a complete examination. Limited examinations for reoccurring indications may be performed as noted. The reflux portion of the exam is performed with the patient in reverse Trendelenburg.  +---------+---------------+---------+-----------+----------+--------------+ RIGHT    CompressibilityPhasicitySpontaneityPropertiesThrombus Aging +---------+---------------+---------+-----------+----------+--------------+ CFV      Full           Yes      Yes                                 +---------+---------------+---------+-----------+----------+--------------+ SFJ      Full                                                        +---------+---------------+---------+-----------+----------+--------------+  FV Prox  Full                                                        +---------+---------------+---------+-----------+----------+--------------+ FV Mid   Full           Yes      Yes                                 +---------+---------------+---------+-----------+----------+--------------+ FV DistalFull                                                        +---------+---------------+---------+-----------+----------+--------------+ PFV      Full                                                        +---------+---------------+---------+-----------+----------+--------------+ POP      Full           Yes      Yes                                 +---------+---------------+---------+-----------+----------+--------------+ PTV      Full                                                        +---------+---------------+---------+-----------+----------+--------------+ PERO     Full                                                         +---------+---------------+---------+-----------+----------+--------------+   +---------+---------------+---------+-----------+----------+--------------+ LEFT     CompressibilityPhasicitySpontaneityPropertiesThrombus Aging +---------+---------------+---------+-----------+----------+--------------+ CFV      Full           Yes      Yes                                 +---------+---------------+---------+-----------+----------+--------------+ SFJ      Full                                                        +---------+---------------+---------+-----------+----------+--------------+ FV Prox  Full                                                        +---------+---------------+---------+-----------+----------+--------------+  FV Mid   Full           Yes      Yes                                 +---------+---------------+---------+-----------+----------+--------------+ FV DistalFull                                                        +---------+---------------+---------+-----------+----------+--------------+ PFV      Full                                                        +---------+---------------+---------+-----------+----------+--------------+ POP      Full           Yes      Yes                                 +---------+---------------+---------+-----------+----------+--------------+ PTV      Full                                                        +---------+---------------+---------+-----------+----------+--------------+ PERO     Full                                                        +---------+---------------+---------+-----------+----------+--------------+     Summary: BILATERAL: - No evidence of deep vein thrombosis seen in the lower extremities, bilaterally. -No evidence of popliteal cyst, bilaterally.   *See table(s) above for measurements and observations. Electronically signed by  Lonni Gaskins MD on 11/28/2023 at 3:25:37 PM.    Final    EEG adult Result Date: 11/28/2023 Shelton Arlin KIDD, MD     11/28/2023 11:38 AM Patient Name: Henry [name] MRN: [id number] Epilepsy Attending: Arlin KIDD Shelton Referring Physician/Provider: Perri DELENA Meliton Mickey., MD Date: 11/28/2023 Duration: 23.12 mins Patient history: 73 year old male with weakness.  EEG to evaluate for seizure. Level of alertness: Awake, drowsy AEDs during EEG study: None Technical aspects: This EEG study was done with scalp electrodes positioned according to the 10-20 International system of electrode placement. Electrical activity was reviewed with band pass filter of 1-70Hz , sensitivity of 7 uV/mm, display speed of 27mm/sec with a 60Hz  notched filter applied as appropriate. EEG data were recorded continuously and digitally stored.  Video monitoring was available and reviewed as appropriate. Description: The posterior dominant rhythm consists of 7.5Hz  activity of moderate voltage (25-35 uV) seen predominantly in posterior head regions, symmetric and reactive to eye opening and eye closing. Drowsiness was characterized by attenuation of the posterior background rhythm.  EEG showed intermittent generalized 5 to 7 Hz theta and delta slowing.  Hyperventilation and photic stimulation were not performed.   ABNORMALITY -  Intermittent slow, generalized IMPRESSION: This study is suggestive of mild generalized cerebral dysfunction (encephalopathy). No seizures or epileptiform discharges were seen throughout the recording. Arlin MALVA Krebs   Portable chest 1 View Result Date: 11/27/2023 CLINICAL DATA:  Cough EXAM: PORTABLE CHEST 1 VIEW COMPARISON:  Chest x-ray 04/03/2022 FINDINGS: Patient is status post cardiac surgery. Left-sided pacemaker is unchanged in position. There is minimal patchy opacity in the right lung base. Costophrenic angles are clear. No pneumothorax or acute fracture. IMPRESSION: Minimal patchy opacity in the right  lung base may represent atelectasis or infection. Electronically Signed   By: Greig Pique M.D.   On: 11/27/2023 20:52   CT HEAD WO CONTRAST Result Date: 11/27/2023 EXAM: CT HEAD WITHOUT CONTRAST 11/27/2023 02:56:29 PM TECHNIQUE: CT of the head was performed without the administration of intravenous contrast. Automated exposure control, iterative reconstruction, and/or weight based adjustment of the mA/kV was utilized to reduce the radiation dose to as low as reasonably achievable. COMPARISON: 11/22/2023 CLINICAL HISTORY: Neuro deficit, acute, stroke suspected. FINDINGS: BRAIN AND VENTRICLES: Bilateral deep brain stimulator leads in place with streak artifact mildly limiting brain assessment. Mild cerebral atrophy. No acute hemorrhage. No evidence of acute infarct. No hydrocephalus. No extra-axial collection. No mass effect or midline shift. Mild carotid siphon calcifications. ORBITS: Bilateral lens replacement. SINUSES: Polypoid mucosal thickening left maxillary sinus. SOFT TISSUES AND SKULL: Right mastoidectomy. No acute soft tissue abnormality. No skull fracture. IMPRESSION: 1. No acute intracranial abnormality. Electronically signed by: Dasie Hamburg MD 11/27/2023 03:10 PM EST RP Workstation: [id number]     Labs:   Basic Metabolic Panel: Recent Labs  Lab 11/30/23 1147 12/02/23 0431 12/03/23 0210 12/04/23 0533 12/05/23 0132  NA 136 136 136 135 138  K 3.7 3.8 3.9 3.9 4.0  CL 97* 97* 96* 94* 94*  CO2 30 30 31 30  35*  GLUCOSE 124* 120* 102* 119* 114*  BUN 23 20 19 20 19   CREATININE 1.30* 1.18 0.99 1.34* 1.27*  CALCIUM  8.7* 8.6* 8.7* 8.5* 8.5*   GFR Estimated Creatinine Clearance: 65.7 mL/min (A) (by C-G formula based on SCr of 1.27 mg/dL (H)). Liver Function Tests: Recent Labs  Lab 11/29/23 0404  AST <10*  ALT <5  ALKPHOS 61  BILITOT 1.1  PROT 6.2*  ALBUMIN  3.5   No results for input(s): LIPASE, AMYLASE in the last 168 hours. No results for input(s): AMMONIA in the last  168 hours. Coagulation profile No results for input(s): INR, PROTIME in the last 168 hours.  CBC: Recent Labs  Lab 11/29/23 0404 12/02/23 0431 12/04/23 0533  WBC 5.4 6.3 6.2  HGB 13.8 13.3 13.4  HCT 42.0 40.0 41.8  MCV 87.7 87.3 89.5  PLT 132* 116* 128*   Cardiac Enzymes: No results for input(s): CKTOTAL, CKMB, CKMBINDEX, TROPONINI in the last 168 hours. BNP: Invalid input(s): POCBNP CBG: Recent Labs  Lab 12/04/23 0609 12/04/23 1158 12/04/23 1617 12/04/23 2051 12/05/23 0535  GLUCAP 131* 126* 129* 137* 126*   D-Dimer No results for input(s): DDIMER in the last 72 hours. Hgb A1c No results for input(s): HGBA1C in the last 72 hours. Lipid Profile No results for input(s): CHOL, HDL, LDLCALC, TRIG, CHOLHDL, LDLDIRECT in the last 72 hours. Thyroid  function studies No results for input(s): TSH, T4TOTAL, T3FREE, THYROIDAB in the last 72 hours.  Invalid input(s): FREET3 Anemia work up No results for input(s): VITAMINB12, FOLATE, FERRITIN, TIBC, IRON, RETICCTPCT in the last 72 hours. Microbiology No results found for this or any previous visit (from the past 240  hours).   Discharge Instructions:   Discharge Instructions     Diet Carb Modified   Complete by: As directed    Discharge instructions   Complete by: As directed    Continue rehabilitation.  Follow-up with neurology as outpatient.  Further instructions as per rehabilitation team.   Increase activity slowly   Complete by: As directed       Allergies as of 12/05/2023       Reactions   Penicillins Anaphylaxis, Rash   Because of a history of documented adverse serious drug reaction;Medi Alert bracelet  is recommended PATIENT HAS HAD A PCN REACTION WITH IMMEDIATE RASH, FACIAL/TONGUE/THROAT SWELLING, SOB, OR LIGHTHEADEDNESS WITH HYPOTENSION:  #  #  YES  #  #  Has patient had a PCN reaction causing severe rash involving mucus membranes or skin necrosis:  unknown Has patient had a PCN reaction that required hospitalization NO Has patient had a PCN reaction occurring within the last 10 years: NO   Klonopin  [clonazepam ] Other (See Comments)   agitation   Peanut-containing Drug Products Cough   Watermelon [citrullus Vulgaris] Other (See Comments), Cough   Tickle in throat        Medication List     PAUSE taking these medications    Omnipod 5 DexG7G6 Intro Gen 5 Kit Wait to take this until your doctor or other care provider tells you to start again. 1 kit by Does not apply route daily.   Omnipod 5 DexG7G6 Pods Gen 5 Misc Wait to take this until your doctor or other care provider tells you to start again. CHANGE POD EVERY 3 DAYS   potassium chloride  SA 20 MEQ tablet Wait to take this until your doctor or other care provider tells you to start again. Commonly known as: KLOR-CON  M TAKE 40 MEQ BY MOUTH IN THE MORNING AND TAKE 20 MEQ IN THE EVENING       TAKE these medications    acetaminophen  500 MG tablet Commonly known as: TYLENOL  Take 1,000 mg by mouth at bedtime as needed for mild pain or headache. What changed: Another medication with the same name was added. Make sure you understand how and when to take each.   acetaminophen  325 MG tablet Commonly known as: TYLENOL  Take 2 tablets (650 mg total) by mouth every 6 (six) hours as needed for mild pain (pain score 1-3), fever or headache (or Fever >/= 101). What changed: You were already taking a medication with the same name, and this prescription was added. Make sure you understand how and when to take each.   albuterol  108 (90 Base) MCG/ACT inhaler Commonly known as: VENTOLIN  HFA TAKE 2 PUFFS BY MOUTH EVERY 6 HOURS AS NEEDED FOR WHEEZE OR SHORTNESS OF BREATH   allopurinol  100 MG tablet Commonly known as: ZYLOPRIM  Take 1.5 tablets (150 mg total) by mouth daily.   aspirin  EC 81 MG tablet Take 1 tablet (81 mg total) by mouth daily. Swallow whole.   Carbidopa -Levodopa  ER  25-100 MG tablet controlled release Commonly known as: SINEMET  CR TAKE 2 TABLETS BY MOUTH 5 TIMES PER DAY AS DIRECTED   cyanocobalamin  1000 MCG tablet Commonly known as: VITAMIN B12 Take 1,000 mcg by mouth daily.   Dexcom G6 Receiver Devi Use to check blood sugar daily   Dexcom G6 Sensor Misc USE TO CHECK BLOOD SUGAR DAILY   Dexcom G6 Transmitter Misc Change every 3 months   dorzolamide -timolol  2-0.5 % ophthalmic solution Commonly known as: COSOPT  Place 1 drop into the  right eye 2 (two) times daily.   DULoxetine  20 MG capsule Commonly known as: Cymbalta  Take 1 capsule (20 mg total) by mouth daily.   enoxaparin  60 MG/0.6ML injection Commonly known as: LOVENOX  Inject 0.5 mLs (50 mg total) into the skin daily.   FREESTYLE LITE test strip Generic drug: glucose blood USE TO CHECK BLOOD SUGAR 4 TIMES DAILY.   furosemide  20 MG tablet Commonly known as: LASIX  Take 2 tablets (40 mg total) by mouth daily. What changed:  See the new instructions. Another medication with the same name was removed. Continue taking this medication, and follow the directions you see here.   icosapent  Ethyl 1 g capsule Commonly known as: Vascepa  Take 2 capsules (2 g total) by mouth 2 (two) times daily.   insulin  aspart 100 UNIT/ML injection Commonly known as: novoLOG  Inject 0-5 Units into the skin at bedtime.   insulin  aspart 100 UNIT/ML injection Commonly known as: novoLOG  Inject 4 Units into the skin 3 (three) times daily with meals.   insulin  aspart 100 UNIT/ML injection Commonly known as: novoLOG  Inject 0-9 Units into the skin 3 (three) times daily with meals.   insulin  glargine-yfgn 100 UNIT/ML injection Commonly known as: SEMGLEE  Inject 0.15 mLs (15 Units total) into the skin at bedtime.   insulin  lispro 100 UNIT/ML injection Commonly known as: HumaLOG  USE MAXIMUM 76 UNITS PER   DAY WITH V-GO PUMP   levothyroxine  150 MCG tablet Commonly known as: SYNTHROID  TAKE 1 TABLET BY  MOUTH DAILY BEFORE BREAKFAST.   losartan  25 MG tablet Commonly known as: COZAAR  Take 0.5 tablets (12.5 mg total) by mouth daily. What changed: how much to take   melatonin 3 MG Tabs tablet Take 1 tablet (3 mg total) by mouth at bedtime as needed.   metoprolol  succinate 25 MG 24 hr tablet Commonly known as: TOPROL -XL Take 1 tablet (25 mg total) by mouth daily. Take with or immediately following a meal. What changed:  medication strength See the new instructions.   mirabegron  ER 25 MG Tb24 tablet Commonly known as: MYRBETRIQ  Take 1 tablet (25 mg total) by mouth daily.   nitroGLYCERIN  0.4 MG SL tablet Commonly known as: NITROSTAT  Place 1 tablet (0.4 mg total) under the tongue every 5 (five) minutes as needed for chest pain.   Nuplazid  34 MG Caps Generic drug: Pimavanserin  Tartrate TAKE 1 CAPSULE BY MOUTH 1 TIME A DAY   OVER THE COUNTER MEDICATION Apply 1 application topically daily as needed (pain). Theraworx Pain Cream   polyethylene glycol 17 g packet Commonly known as: MIRALAX  / GLYCOLAX  Take 8.5 g by mouth daily.   rosuvastatin  20 MG tablet Commonly known as: CRESTOR  TAKE 1 TAB BY MOUTH EVERYDAY AT BEDTIME   sennosides-docusate sodium  8.6-50 MG tablet Commonly known as: SENOKOT-S Take 1 tablet by mouth daily as needed for constipation. Take along with miralax .   spironolactone  25 MG tablet Commonly known as: ALDACTONE  TAKE 1/2 TABLET BY MOUTH EVERY DAY   tirzepatide  7.5 MG/0.5ML Pen Commonly known as: MOUNJARO  Inject 7.5 mg into the skin once a week.   Vitamin D  50 MCG (2000 UT) tablet Take 2,000 Units by mouth every evening.   Wixela Inhub 250-50 MCG/ACT Aepb Generic drug: fluticasone -salmeterol INHALE 1 PUFF INTO THE LUNGS IN THE MORNING AND AT BEDTIME**RINSE AFTER USE** What changed: See the new instructions.          Time coordinating discharge: 39 minutes  Signed:  Rozalyn Osland  [facility] 12/05/2023, 11:16 AM

## 2023-12-05 NOTE — H&P (Signed)
 Physical Medicine and Rehabilitation Admission H&P    Chief Complaint  Patient presents with   PD exacerbation.     HPI:  Henry [name] is a 73 year old male with history of PD s/p DBS, autonomic dysfunction,  T2DM, Chronic systolic and diastolic CHF, ICM w/chronic systolic/diastolic HF, PAF- not on AC due to falls, OSA, HOH, MI, recent episodes of staring with poor responsiveness and DBS adjusted. He  was admitted on 11/27/23 with reports of staring BLE edema, episodes, with decrease in LOC and leaning to the right. left sided weakness and difficulty walking. BLE dopplers negative for DVT. CT head repeated and negative for acute changes.  LT-EEG ordered and showed mild generalized dysfunction without epileptiform discharge or seizures.  BP meds held but lasix  resumed due to SOB with evidence of overload.  CT A/P ordered due to issues with abdominal pain and showed new consolidation of RML  and BLE , chronic cholelithiasis and redundant large bowel with retained stool and laxatives added. Cardiology consulted on 11/24 for input and recommended continuing oral lasix  and BB resumed with recommendations to monitor BP closely due to history of autonomic dysfunction. BLE edema has resolved. He has had issues fatigue and weakness. Wife reports hallucinations with agitation last night. Noted to have urinary retention today.  Constipation improved with laxatives. PT/OT has been working with patient who requires supervision to mod assist with ADLs and mod to max assist for pregait activity. CIR recommended due to functional decline.    Review of Systems  Constitutional:  Negative for chills and fever.  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   Eyes:  Negative for blurred vision and double vision.       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 Negative for cough and hemoptysis.   Cardiovascular:   Negative for chest pain and palpitations.  Gastrointestinal:  Negative for abdominal pain, constipation and heartburn.  Genitourinary: Negative.   Musculoskeletal:  Positive for falls. Negative for back pain, myalgias and neck pain.  Neurological:  Positive for tingling, speech change, weakness and headaches (on left side). Negative for dizziness and sensory change.  Psychiatric/Behavioral:  The patient does not have insomnia.      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)   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   Dysrhythmia    a-fib   GERD (gastroesophageal reflux disease)    Glaucoma    right eye   History of kidney stones    multiple   HLD (hyperlipidemia)    HTN (hypertension)    pt denies 08/19/12   Hyperplasia, prostate    Hypothyroidism    MI (myocardial infarction) (HCC)    Dr Pietro 2000, x3vessels bypass   Neuromuscular disorder (HCC)    Parkinson's Disease   OSA (obstructive sleep apnea)    AHI-28,on CPAP, noncompliant with CPAP   Parkinson disease (HCC)  1999   PONV (postoperative nausea and vomiting)    Restless legs    Shortness of breath    Hx: of at all times   UTI (lower urinary tract infection) 09/15/2012   Klebsiella   Ventral hernia    Walker as ambulation aid    also uses wheelchair at home/when going out    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   BIOPSY  10/14/2018   Procedure: BIOPSY;  Surgeon: Albertus Gordy HERO, MD;  Location: [facility];  Service: Gastroenterology;;   CATARACT EXTRACTION W/ INTRAOCULAR LENS  IMPLANT     Hx: of right eye   CATARACT EXTRACTION W/ INTRAOCULAR LENS IMPLANT Left 2018   COLONOSCOPY N/A 10/13/2014   Procedure: COLONOSCOPY;  Surgeon: Gordy HERO Albertus, MD;  Location: [facility];  Service: Gastroenterology;  Laterality: N/A;   COLONOSCOPY W/ BIOPSIES AND POLYPECTOMY     Hx: of   COLONOSCOPY WITH PROPOFOL  N/A 10/14/2018   Procedure: COLONOSCOPY WITH PROPOFOL ;  Surgeon: Albertus Gordy HERO, MD;  Location: [facility];  Service: Gastroenterology;  Laterality: N/A;   CORONARY ARTERY BYPASS GRAFT  2000   Sudie Laine, MD   CORONARY STENT PLACEMENT  1998   DEEP BRAIN STIMULATOR PLACEMENT  2004   Right and left VIN stimulator placement (parkinsons)   EYE SURGERY     Cataract removed in 1980s related to wood in eye when 73 years old   FINGER AMPUTATION     left pointer   IMPLANTABLE CARDIOVERTER DEFIBRILLATOR IMPLANT N/A 11/13/2013   Procedure: IMPLANTABLE CARDIOVERTER DEFIBRILLATOR IMPLANT;  Surgeon: Danelle LELON Birmingham, MD;  Location: [facility] CATH LAB;  Service: Cardiovascular;  Laterality: N/A;   INSERT / REPLACE / REMOVE PACEMAKER     medtronic   LEFT AND RIGHT HEART CATHETERIZATION WITH CORONARY ANGIOGRAM N/A 09/24/2013   Procedure: LEFT AND RIGHT HEART CATHETERIZATION WITH CORONARY ANGIOGRAM;  Surgeon: Lonni JONETTA Cash, MD;  Location: [facility] CATH LAB;  Service: Cardiovascular;  Laterality: N/A;   LITHOTRIPSY     3 different times   POLYPECTOMY  10/14/2018   Procedure: POLYPECTOMY;  Surgeon: Albertus Gordy HERO, MD;  Location: [facility];  Service: Gastroenterology;;   PULSE GENERATOR IMPLANT Right 11/13/2017   Procedure: Right chest implantable pulse generator change;  Surgeon: Unice Pac, MD;  Location: [facility] OR;  Service: Neurosurgery;  Laterality: Right;  Right chest implantable pulse generator change   SUBTHALAMIC STIMULATOR BATTERY REPLACEMENT N/A 09/05/2012   Procedure: Deep brain stimulator battery change;  Surgeon: Pac Unice, MD;  Location: [facility];  Service:  Neurosurgery;  Laterality: N/A;  Deep brain stimulator battery change   SUBTHALAMIC STIMULATOR BATTERY REPLACEMENT N/A 06/10/2015   Procedure: Deep Brain stimulator battery change;  Surgeon: Pac Unice, MD;  Location: [facility];  Service: Neurosurgery;  Laterality: N/A;   SUBTHALAMIC STIMULATOR BATTERY REPLACEMENT Right 09/25/2019   Procedure: Deep brain stimulator battery change;  Surgeon: Unice Pac, MD;  Location: [facility] OR;  Service: Neurosurgery;  Laterality: Right;   SUBTHALAMIC STIMULATOR BATTERY REPLACEMENT Right 12/15/2022   Procedure: IMPLANTABLE PULSE GENERATOR  BATTERY REPLACEMENT;  Surgeon: Carollee Lani BROCKS, DO;  Location: [facility] OR;  Service: Neurosurgery;  Laterality: Right;  3C   TONSILLECTOMY      Family History  Problem Relation Age of Onset   Aneurysm Mother    Alcoholism Father    HIV/AIDS Brother 18       AIDS   Healthy Sister  Healthy Child    Peripheral vascular disease Other    Arthritis Other    Healthy Sister    Healthy Child    Healthy Child    Diabetes Neg Hx    Heart disease Neg Hx    Colon cancer Neg Hx    Prostate cancer Neg Hx     Social History:  reports that he has never smoked. He has never been exposed to tobacco smoke. He has never used smokeless tobacco. He reports that he does not currently use alcohol. He reports that he does not use drugs.   Allergies  Allergen Reactions   Penicillins Anaphylaxis and Rash    Because of a history of documented adverse serious drug reaction;Medi Alert bracelet  is recommended PATIENT HAS HAD A PCN REACTION WITH IMMEDIATE RASH, FACIAL/TONGUE/THROAT SWELLING, SOB, OR LIGHTHEADEDNESS WITH HYPOTENSION:  #  #  YES  #  #  Has patient had a PCN reaction causing severe rash involving mucus membranes or skin necrosis: unknown Has patient had a PCN reaction that required hospitalization NO Has patient had a PCN reaction occurring within the last 10 years: NO   Klonopin  [Clonazepam ] Other (See Comments)    agitation    Peanut-Containing Drug Products Cough   Watermelon [Citrullus Vulgaris] Other (See Comments) and Cough    Tickle in throat    Medications Prior to Admission  Medication Sig Dispense Refill   acetaminophen  (TYLENOL ) 500 MG tablet Take 1,000 mg by mouth at bedtime as needed for mild pain or headache.      albuterol  (VENTOLIN  HFA) 108 (90 Base) MCG/ACT inhaler TAKE 2 PUFFS BY MOUTH EVERY 6 HOURS AS NEEDED FOR WHEEZE OR SHORTNESS OF BREATH 18 each 2   allopurinol  (ZYLOPRIM ) 100 MG tablet Take 1.5 tablets (150 mg total) by mouth daily. 135 tablet 3   aspirin  EC 81 MG tablet Take 1 tablet (81 mg total) by mouth daily. Swallow whole.     Carbidopa -Levodopa  ER (SINEMET  CR) 25-100 MG tablet controlled release TAKE 2 TABLETS BY MOUTH 5 TIMES PER DAY AS DIRECTED 900 tablet 0   Cholecalciferol  (VITAMIN D ) 50 MCG (2000 UT) tablet Take 2,000 Units by mouth every evening.     dorzolamide -timolol  (COSOPT ) 22.3-6.8 MG/ML ophthalmic solution Place 1 drop into the right eye 2 (two) times daily.     fluticasone -salmeterol (WIXELA INHUB) 250-50 MCG/ACT AEPB INHALE 1 PUFF INTO THE LUNGS IN THE MORNING AND AT BEDTIME**RINSE AFTER USE** (Patient taking differently: Inhale 1 puff into the lungs daily as needed (for shortness of breath).) 180 each 1   furosemide  (LASIX ) 80 MG tablet TAKE 1 TABLET BY MOUTH EVERY DAY (Patient taking differently: Take 40 mg by mouth daily.) 90 tablet 1   icosapent  Ethyl (VASCEPA ) 1 g capsule Take 2 capsules (2 g total) by mouth 2 (two) times daily. 360 capsule 3   insulin  lispro (HUMALOG ) 100 UNIT/ML injection USE MAXIMUM 76 UNITS PER   DAY WITH V-GO PUMP 60 mL 3   levothyroxine  (SYNTHROID ) 150 MCG tablet TAKE 1 TABLET BY MOUTH DAILY BEFORE BREAKFAST. 90 tablet 3   mirabegron  ER (MYRBETRIQ ) 25 MG TB24 tablet Take 1 tablet (25 mg total) by mouth daily. 90 tablet 3   nitroGLYCERIN  (NITROSTAT ) 0.4 MG SL tablet Place 1 tablet (0.4 mg total) under the tongue every 5 (five) minutes as needed  for chest pain. 25 tablet 12   NUPLAZID  34 MG CAPS TAKE 1 CAPSULE BY MOUTH 1 TIME A DAY 90 capsule 0  OVER THE COUNTER MEDICATION Apply 1 application topically daily as needed (pain). Theraworx Pain Cream     polyethylene glycol (MIRALAX  / GLYCOLAX ) packet Take 8.5 g by mouth daily.     [Paused] potassium chloride  SA (KLOR-CON  M) 20 MEQ tablet TAKE 40 MEQ BY MOUTH IN THE MORNING AND TAKE 20 MEQ IN THE EVENING 270 tablet 1   rosuvastatin  (CRESTOR ) 20 MG tablet TAKE 1 TAB BY MOUTH EVERYDAY AT BEDTIME 90 tablet 3   sennosides-docusate sodium  (SENOKOT-S) 8.6-50 MG tablet Take 1 tablet by mouth daily as needed for constipation. Take along with miralax .     spironolactone  (ALDACTONE ) 25 MG tablet TAKE 1/2 TABLET BY MOUTH EVERY DAY 45 tablet 1   tirzepatide  (MOUNJARO ) 7.5 MG/0.5ML Pen Inject 7.5 mg into the skin once a week. 6 mL 3   vitamin B-12 (CYANOCOBALAMIN ) 1000 MCG tablet Take 1,000 mcg by mouth daily.     [DISCONTINUED] furosemide  (LASIX ) 20 MG tablet TAKE 1 (20MG ) TABLET AND 1/2TAB OF THE 80 MG TABLET ONCE DAILY TO EQUAL 60 MG DAILY (Patient taking differently: Take 20 mg by mouth daily.) 30 tablet 0   Continuous Blood Gluc Receiver (DEXCOM G6 RECEIVER) DEVI Use to check blood sugar daily 1 each 0   Continuous Glucose Sensor (DEXCOM G6 SENSOR) MISC USE TO CHECK BLOOD SUGAR DAILY 9 each 3   Continuous Glucose Transmitter (DEXCOM G6 TRANSMITTER) MISC Change every 3 months 1 each 3   FREESTYLE LITE test strip USE TO CHECK BLOOD SUGAR 4 TIMES DAILY. 150 strip 3   [Paused] Insulin  Disposable Pump (OMNIPOD 5 DEXG7G6 PODS GEN 5) MISC CHANGE POD EVERY 3 DAYS 30 each 3   [Paused] Insulin  Disposable Pump (OMNIPOD 5 G6 INTRO, GEN 5,) KIT 1 kit by Does not apply route daily. 1 kit 0   [DISCONTINUED] DULoxetine  (CYMBALTA ) 20 MG capsule Take 1 capsule (20 mg total) by mouth daily. (Patient not taking: Reported on 11/27/2023) 30 capsule 0     Home: Home Living Family/patient expects to be discharged to::  Private residence Living Arrangements: Spouse/significant other, Children, Other relatives Available Help at Discharge: Family, Available 24 hours/day Type of Home: House Home Access: Ramped entrance Home Layout: One level Bathroom Shower/Tub: Health Visitor: Handicapped height Home Equipment: Agricultural Consultant (2 wheels), Other (comment), Grab bars - toilet, Grab bars - tub/shower, Shower seat - built in, Hand held shower head, Transport chair, Lift chair (U-step walker)   Functional History: Prior Function Prior Level of Function : Needs assist Mobility Comments: ambulates with U-step or RW and assistance. sleeps in his lift chair ADLs Comments: set up for eating, grooms with set up, assisted to wash lower body and to dry off, assist for lower body dressing  Functional Status:  Mobility: Bed Mobility Overal bed mobility: Needs Assistance Bed Mobility: Supine to Sit Supine to sit: Max assist, HOB elevated, Used rails Sit to supine: Max assist General bed mobility comments: OOB in recliner Transfers Overall transfer level: Needs assistance Equipment used: Rolling walker (2 wheels) Transfers: Sit to/from Stand Sit to Stand: Mod assist Bed to/from chair/wheelchair/BSC transfer type:: Step pivot Step pivot transfers: Min assist General transfer comment: cues for hand placement and mod assist for first and last stand and min assist for 2nd stand Ambulation/Gait Ambulation/Gait assistance: Min assist Gait Distance (Feet): 40 Feet Assistive device: Rolling walker (2 wheels) Gait Pattern/deviations: Shuffle, Trunk flexed, Decreased stride length, Step-through pattern General Gait Details: unable to progress Gait velocity: reduced Gait velocity interpretation: <1.31 ft/sec,  indicative of household ambulator    ADL: ADL Overall ADL's : Needs assistance/impaired Eating/Feeding: Moderate assistance, Sitting Eating/Feeding Details (indicate cue type and reason): able to  feed self finger foods with setup and elbow supported, requires assistance to load utensils and to hold cup Grooming: Wash/dry hands, Wash/dry face, Supervision/safety, Sitting Grooming Details (indicate cue type and reason): in recliner Upper Body Bathing: Maximal assistance, Sitting Lower Body Bathing: Total assistance, +2 for physical assistance, Sit to/from stand Upper Body Dressing : Maximal assistance, Sitting Lower Body Dressing: Maximal assistance Lower Body Dressing Details (indicate cue type and reason): pt requires assist to get started with clothing over feet but increased abililty to transfer sit to stand this am.  Worked on letting go of walker with one hand to pull up pants with other hand in standing. Toilet Transfer: Moderate assistance, Rolling walker (2 wheels), BSC/3in1 Toilet Transfer Details (indicate cue type and reason): step pivot transfer to [facility] with mod assist for sit to stand and min assist for balance with cues for hand placement Toileting- Clothing Manipulation and Hygiene: Maximal assistance, Sit to/from stand, Cueing for compensatory techniques Toileting - Clothing Manipulation Details (indicate cue type and reason): pt working on pulling up pants with one hand while letting go with the other. Functional mobility during ADLs: Minimal assistance, +2 for physical assistance, Rolling walker (2 wheels) General ADL Comments: patient up in recliner and assisted with completing breakfast  Cognition: Cognition Orientation Level: Oriented to person, Oriented to time, Disoriented to place, Disoriented to situation Cognition Arousal: Alert Behavior During Therapy: Flat affect   Blood pressure 110/67, pulse 61, temperature 97.9 F (36.6 C), temperature source Oral, resp. rate 16, height 5' 9 (1.753 m), weight 115 kg, SpO2 97%.  General: No apparent distress, laying in bed HEENT: Head is normocephalic, atraumatic, sclera anicteric, oral mucosa with white coating. Masked  faces Neck: Supple without JVD  Heart: Reg rate and rhythm. No murmurs rubs or gallops Chest: CTA bilaterally without wheezes, rales, or rhonchi; no distress Abdomen: Soft, non-tender, non-distended, bowel sounds positive. Extremities: No clubbing, cyanosis. Trace LE edema Psych: Pt's affect is flat.  Cooperative Skin: Bruising noted on bilateral upper extremities Neuro:     Mental Status: AAOx4, memory deficits present, intermittant confusion, delayed responses, able to provide his date of birth Speech/Languate: Naming and repetition intact, + mod to severe dysarthria, follows simple commands CRANIAL NERVES: II: PERRL. Visual fields full III, IV, VI: EOM intact, no gaze preference or deviation V: normal sensation bilaterally VII: no asymmetry VIII: normal hearing to speech IX, X: normal palatal elevation XI: head turn intact b/l XII: Tongue midline     MOTOR: RUE: 4/5 Deltoid, 4/5 Biceps, 4/5 Triceps,4/5 Grip LUE: 4/5 Deltoid, 4/5 Biceps, 4/5 Triceps, 4/5 Grip RLE: HF 3/5, KE 4/5, ADF 3/5, APF 3/5 LLE: HF 3/5, KE 4/5, ADF 3/5, APF 3/5   SENSORY: Normal to touch all 4 extremities   Increase tone/rigidity in b/l legs Right greater than left Slightly increased tone in LUE>RUE   + mild tremor RUE Slowed movements with UE and LE   Results for orders placed or performed during the hospital encounter of 11/27/23 (from the past 48 hours)  Glucose, capillary     Status: None   Collection Time: 12/03/23  4:31 PM  Result Value Ref Range   Glucose-Capillary 94 70 - 99 mg/dL    Comment: Glucose reference range applies only to samples taken after fasting for at least 8 hours.  Glucose, capillary  Status: Abnormal   Collection Time: 12/03/23  9:11 PM  Result Value Ref Range   Glucose-Capillary 146 (H) 70 - 99 mg/dL    Comment: Glucose reference range applies only to samples taken after fasting for at least 8 hours.   Comment 1 Notify RN   CBC     Status: Abnormal   Collection  Time: 12/04/23  5:33 AM  Result Value Ref Range   WBC 6.2 4.0 - 10.5 K/uL   RBC 4.67 4.22 - 5.81 MIL/uL   Hemoglobin 13.4 13.0 - 17.0 g/dL   HCT 58.1 60.9 - 47.9 %   MCV 89.5 80.0 - 100.0 fL   MCH 28.7 26.0 - 34.0 pg   MCHC 32.1 30.0 - 36.0 g/dL   RDW 85.9 88.4 - 84.4 %   Platelets 128 (L) 150 - 400 K/uL   nRBC 0.0 0.0 - 0.2 %    Comment: Performed at [facility], [address] [place], [place] [address]  Basic metabolic panel     Status: Abnormal   Collection Time: 12/04/23  5:33 AM  Result Value Ref Range   Sodium 135 135 - 145 mmol/L   Potassium 3.9 3.5 - 5.1 mmol/L   Chloride 94 (L) 98 - 111 mmol/L   CO2 30 22 - 32 mmol/L   Glucose, Bld 119 (H) 70 - 99 mg/dL    Comment: Glucose reference range applies only to samples taken after fasting for at least 8 hours.   BUN 20 8 - 23 mg/dL   Creatinine, Ser 8.65 (H) 0.61 - 1.24 mg/dL   Calcium  8.5 (L) 8.9 - 10.3 mg/dL   GFR, Estimated 56 (L) >60 mL/min    Comment: (NOTE) Calculated using the CKD-EPI Creatinine Equation (2021)    Anion gap 11 5 - 15    Comment: Performed at [facility], [address] [place], [place] [address]  Glucose, capillary     Status: Abnormal   Collection Time: 12/04/23  6:09 AM  Result Value Ref Range   Glucose-Capillary 131 (H) 70 - 99 mg/dL    Comment: Glucose reference range applies only to samples taken after fasting for at least 8 hours.   Comment 1 Notify RN   Glucose, capillary     Status: Abnormal   Collection Time: 12/04/23 11:58 AM  Result Value Ref Range   Glucose-Capillary 126 (H) 70 - 99 mg/dL    Comment: Glucose reference range applies only to samples taken after fasting for at least 8 hours.  Glucose, capillary     Status: Abnormal   Collection Time: 12/04/23  4:17 PM  Result Value Ref Range   Glucose-Capillary 129 (H) 70 - 99 mg/dL    Comment: Glucose reference range applies only to samples taken after fasting for at least 8 hours.  Glucose, capillary     Status:  Abnormal   Collection Time: 12/04/23  8:51 PM  Result Value Ref Range   Glucose-Capillary 137 (H) 70 - 99 mg/dL    Comment: Glucose reference range applies only to samples taken after fasting for at least 8 hours.  Basic metabolic panel     Status: Abnormal   Collection Time: 12/05/23  1:32 AM  Result Value Ref Range   Sodium 138 135 - 145 mmol/L   Potassium 4.0 3.5 - 5.1 mmol/L   Chloride 94 (L) 98 - 111 mmol/L   CO2 35 (H) 22 - 32 mmol/L   Glucose, Bld 114 (H) 70 -  99 mg/dL    Comment: Glucose reference range applies only to samples taken after fasting for at least 8 hours.   BUN 19 8 - 23 mg/dL   Creatinine, Ser 8.72 (H) 0.61 - 1.24 mg/dL   Calcium  8.5 (L) 8.9 - 10.3 mg/dL   GFR, Estimated >39 >39 mL/min    Comment: (NOTE) Calculated using the CKD-EPI Creatinine Equation (2021)    Anion gap 9 5 - 15    Comment: Performed at [facility], [address], [place], [place] [address]  Glucose, capillary     Status: Abnormal   Collection Time: 12/05/23  5:35 AM  Result Value Ref Range   Glucose-Capillary 126 (H) 70 - 99 mg/dL    Comment: Glucose reference range applies only to samples taken after fasting for at least 8 hours.  Glucose, capillary     Status: Abnormal   Collection Time: 12/05/23 11:47 AM  Result Value Ref Range   Glucose-Capillary 145 (H) 70 - 99 mg/dL    Comment: Glucose reference range applies only to samples taken after fasting for at least 8 hours.   Comment 1 Notify RN    Comment 2 Document in Chart    *Note: Due to a large number of results and/or encounters for the requested time period, some results have not been displayed. A complete set of results can be found in Results Review.   No results found.    Blood pressure 110/67, pulse 61, temperature 97.9 F (36.6 C), temperature source Oral, resp. rate 16, height 5' 9 (1.753 m), weight 115 kg, SpO2 97%.  Medical Problem List and Plan: 1. Functional deficits secondary to generalized weakness due  to parkinson disease. Pt has DBS.   -patient may shower  -ELOS/Goals: 14-16, PT sup to CGA, OT mon/mod A, SLP sup to min A  -Admit to CIR 2.  Antithrombotics: -DVT/anticoagulation:  Pharmaceutical: Lovenox   -antiplatelet therapy: ASA 3. Pain Management: Tylenol  prn.  4. Mood/Behavior/Sleep: LCSW to follow for evaluation and support.   -antipsychotic agents: N/A 5. Neuropsych/cognition: This patient may be intermittent  capable of making decisions on his own behalf. 6. Skin/Wound Care: Routine pressure relief measures 7. Fluids/Electrolytes/Nutrition: Strict I/O. Daily weights 8. PD s/p DBS/Autonomic dysfunction: Monitor for orthostatic symptoms with increase in activity  -continue sinemet  ER five times a day.  --Pimavanserin  daily to  manage hallucinations  9. T2DM: Monitor BS ac/hs and use SSI for elevated BS. Monitor with activity  --On Lantus  15 units BId with novolog  4 units TID ac  --titrate as indicated.  10. HFrEF: Strict I/O. Daily weights and monitor for signs of overload.   --Low salt diet. Continue ASA, Lasix , Cozaar , Toprol  XL and aldactone    11. Glaucoma: Managed with cosopt  12. Hyperactive airway disease: On Breo.  13. Thrush: Will add nystatin  mouth wash 14. AKI: SCr on rise to 1.27-->recheck in am.  --Encourage fluid intake. Orthostatic vitals.  15.  Thrombocytopenia: Monitor for signs of bleeding. Recheck CBC in am.   16. Urinary retention: Cathed today for 500 cc--will d/c mirabegron . Will monitor voiding with PVR checks.  17. Constipation: Has resolved with Miralax  BID 18. Concern for seizures: monitor for signs/symptoms 19. Spasticity/dystonia: patient follows with Dr. Carilyn for botox  outpatient   Sharlet GORMAN Schmitz, PA-C 12/05/2023  I have personally performed a face to face diagnostic evaluation of this patient and formulated the key components of the plan.  Additionally, I have personally reviewed laboratory data, imaging studies, as well as relevant  notes  and concur with the physician assistant's documentation above.  The patient's status has not changed from the original H&P.  Any changes in documentation from the acute care chart have been noted above.  Murray Collier, MD

## 2023-12-05 NOTE — H&P (Signed)
 Physical Medicine and Rehabilitation Admission H&P        Chief Complaint  Patient presents with   PD exacerbation.       HPI:  Henry [name] is a 73 year old male with history of PD s/p DBS, autonomic dysfunction,  T2DM, Chronic systolic and diastolic CHF, ICM w/chronic systolic/diastolic HF, PAF- not on AC due to falls, OSA, HOH, MI, recent episodes of staring with poor responsiveness and DBS adjusted. He  was admitted on 11/27/23 with reports of staring BLE edema, episodes, with decrease in LOC and leaning to the right. left sided weakness and difficulty walking. BLE dopplers negative for DVT. CT head repeated and negative for acute changes.  LT-EEG ordered and showed mild generalized dysfunction without epileptiform discharge or seizures.  BP meds held but lasix  resumed due to SOB with evidence of overload.   CT A/P ordered due to issues with abdominal pain and showed new consolidation of RML  and BLE , chronic cholelithiasis and redundant large bowel with retained stool and laxatives added. Cardiology consulted on 11/24 for input and recommended continuing oral lasix  and BB resumed with recommendations to monitor BP closely due to history of autonomic dysfunction. BLE edema has resolved. He has had issues fatigue and weakness. Wife reports hallucinations with agitation last night. Noted to have urinary retention today.  Constipation improved with laxatives. PT/OT has been working with patient who requires supervision to mod assist with ADLs and mod to max assist for pregait activity. CIR recommended due to functional decline.      Review of Systems  Constitutional:  Negative for chills and fever.  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   Eyes:  Negative for blurred vision and double vision.       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 Negative for cough and hemoptysis.    Cardiovascular:  Negative for chest pain and palpitations.  Gastrointestinal:  Negative for abdominal pain, constipation and heartburn.  Genitourinary: Negative.   Musculoskeletal:  Positive for falls. Negative for back pain, myalgias and neck pain.  Neurological:  Positive for tingling, speech change, weakness and headaches (on left side). Negative for dizziness and sensory change.  Psychiatric/Behavioral:  The patient does not have insomnia.            Past Medical History:  Diagnosis Date   AICD (automatic cardioverter/defibrillator) present      Dr Waddell office visit yearly, MDT  medtronic    Arthritis      cane   Asthma     CAD (coronary artery disease)     Cardiomyopathy     Cataract      removed   Complication of anesthesia      pt states that he got a rash   Constipation     Deaf      right ear, 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)   DM (diabetes mellitus) (HCC)      TYPE 2 - insulin  pump   Dysrhythmia      a-fib   GERD (gastroesophageal reflux disease)     Glaucoma      right eye   History of kidney stones      multiple   HLD (hyperlipidemia)     HTN (hypertension)      pt denies 08/19/12   Hyperplasia, prostate     Hypothyroidism  MI (myocardial infarction) (HCC)      Dr Pietro 2000, x3vessels bypass   Neuromuscular disorder (HCC)      Parkinson's Disease   OSA (obstructive sleep apnea)      AHI-28,on CPAP, noncompliant with CPAP   Parkinson disease (HCC)      1999   PONV (postoperative nausea and vomiting)     Restless legs     Shortness of breath      Hx: of at all times   UTI (lower urinary tract infection) 09/15/2012    Klebsiella   Ventral hernia     Walker as ambulation aid      also uses wheelchair at home/when going out               Past Surgical History:  Procedure Laterality Date   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   BIOPSY   10/14/2018    Procedure: BIOPSY;  Surgeon: Albertus Gordy HERO, MD;  Location: [facility];  Service: Gastroenterology;;   CATARACT EXTRACTION W/ INTRAOCULAR LENS IMPLANT        Hx: of right eye   CATARACT EXTRACTION W/ INTRAOCULAR LENS IMPLANT Left 2018   COLONOSCOPY N/A 10/13/2014    Procedure: COLONOSCOPY;  Surgeon: Gordy HERO Albertus, MD;  Location: [facility];  Service: Gastroenterology;  Laterality: N/A;   COLONOSCOPY W/ BIOPSIES AND POLYPECTOMY        Hx: of   COLONOSCOPY WITH PROPOFOL  N/A 10/14/2018    Procedure: COLONOSCOPY WITH PROPOFOL ;  Surgeon: Albertus Gordy HERO, MD;  Location: [facility];  Service: Gastroenterology;  Laterality: N/A;   CORONARY ARTERY BYPASS GRAFT   2000    Sudie Laine, MD   CORONARY STENT PLACEMENT   1998   DEEP BRAIN STIMULATOR PLACEMENT   2004    Right and left VIN stimulator placement (parkinsons)   EYE SURGERY        Cataract removed in 1980s related to wood in eye when 73 years old   FINGER AMPUTATION        left pointer   IMPLANTABLE CARDIOVERTER DEFIBRILLATOR IMPLANT N/A 11/13/2013    Procedure: IMPLANTABLE CARDIOVERTER DEFIBRILLATOR IMPLANT;  Surgeon: Danelle LELON Birmingham, MD;  Location[facility];  Service: Cardiovascular;  Laterality: N/A;   INSERT / REPLACE / REMOVE PACEMAKER        medtronic   LEFT AND RIGHT HEART CATHETERIZATION WITH CORONARY ANGIOGRAM N/A 09/24/2013    Procedure: LEFT AND RIGHT HEART CATHETERIZATION WITH CORONARY ANGIOGRAM;  Surgeon: Lonni JONETTA Cash, MD;  Location: [facility] CATH LAB;  Service: Cardiovascular;  Laterality: N/A;   LITHOTRIPSY        3 different times   POLYPECTOMY   10/14/2018    Procedure: POLYPECTOMY;  Surgeon: Albertus Gordy HERO, MD;  Location: [facility];  Service: Gastroenterology;;   PULSE GENERATOR IMPLANT Right 11/13/2017    Procedure: Right chest implantable pulse generator change;  Surgeon: Unice Pac, MD;  Location: [facility] OR;  Service: Neurosurgery;  Laterality: Right;  Right chest implantable pulse generator change   SUBTHALAMIC STIMULATOR BATTERY REPLACEMENT N/A 09/05/2012     Procedure: Deep brain stimulator battery change;  Surgeon: Pac Unice, MD;  Location: [facility];  Service: Neurosurgery;  Laterality: N/A;  Deep brain stimulator battery change   SUBTHALAMIC STIMULATOR BATTERY REPLACEMENT N/A 06/10/2015    Procedure: Deep Brain stimulator battery change;  Surgeon: Pac Unice, MD;  Location: [facility];  Service: Neurosurgery;  Laterality: N/A;  SUBTHALAMIC STIMULATOR BATTERY REPLACEMENT Right 09/25/2019    Procedure: Deep brain stimulator battery change;  Surgeon: Unice Pac, MD;  Location: [facility] OR;  Service: Neurosurgery;  Laterality: Right;   SUBTHALAMIC STIMULATOR BATTERY REPLACEMENT Right 12/15/2022    Procedure: IMPLANTABLE PULSE GENERATOR  BATTERY REPLACEMENT;  Surgeon: Carollee Lani BROCKS, DO;  Location: [facility] OR;  Service: Neurosurgery;  Laterality: Right;  3C   TONSILLECTOMY                   Family History  Problem Relation Age of Onset   Aneurysm Mother     Alcoholism Father     HIV/AIDS Brother 65        AIDS   Healthy Sister     Healthy Child     Peripheral vascular disease Other     Arthritis Other     Healthy Sister     Healthy Child     Healthy Child     Diabetes Neg Hx     Heart disease Neg Hx     Colon cancer Neg Hx     Prostate cancer Neg Hx            Social History:  reports that he has never smoked. He has never been exposed to tobacco smoke. He has never used smokeless tobacco. He reports that he does not currently use alcohol. He reports that he does not use drugs.     Allergies       Allergies  Allergen Reactions   Penicillins Anaphylaxis and Rash      Because of a history of documented adverse serious drug reaction;Medi Alert bracelet  is recommended PATIENT HAS HAD A PCN REACTION WITH IMMEDIATE RASH, FACIAL/TONGUE/THROAT SWELLING, SOB, OR LIGHTHEADEDNESS WITH HYPOTENSION:  #  #  YES  #  #  Has patient had a PCN reaction causing severe rash involving mucus membranes or skin necrosis: unknown Has patient had a  PCN reaction that required hospitalization NO Has patient had a PCN reaction occurring within the last 10 years: NO   Klonopin  [Clonazepam ] Other (See Comments)      agitation   Peanut-Containing Drug Products Cough   Watermelon [Citrullus Vulgaris] Other (See Comments) and Cough      Tickle in throat              Medications Prior to Admission  Medication Sig Dispense Refill   acetaminophen  (TYLENOL ) 500 MG tablet Take 1,000 mg by mouth at bedtime as needed for mild pain or headache.        albuterol  (VENTOLIN  HFA) 108 (90 Base) MCG/ACT inhaler TAKE 2 PUFFS BY MOUTH EVERY 6 HOURS AS NEEDED FOR WHEEZE OR SHORTNESS OF BREATH 18 each 2   allopurinol  (ZYLOPRIM ) 100 MG tablet Take 1.5 tablets (150 mg total) by mouth daily. 135 tablet 3   aspirin  EC 81 MG tablet Take 1 tablet (81 mg total) by mouth daily. Swallow whole.       Carbidopa -Levodopa  ER (SINEMET  CR) 25-100 MG tablet controlled release TAKE 2 TABLETS BY MOUTH 5 TIMES PER DAY AS DIRECTED 900 tablet 0   Cholecalciferol  (VITAMIN D ) 50 MCG (2000 UT) tablet Take 2,000 Units by mouth every evening.       dorzolamide -timolol  (COSOPT ) 22.3-6.8 MG/ML ophthalmic solution Place 1 drop into the right eye 2 (two) times daily.       fluticasone -salmeterol (WIXELA INHUB) 250-50 MCG/ACT AEPB INHALE 1 PUFF INTO THE LUNGS IN THE MORNING AND AT BEDTIME**RINSE AFTER USE** (Patient taking  differently: Inhale 1 puff into the lungs daily as needed (for shortness of breath).) 180 each 1   furosemide  (LASIX ) 80 MG tablet TAKE 1 TABLET BY MOUTH EVERY DAY (Patient taking differently: Take 40 mg by mouth daily.) 90 tablet 1   icosapent  Ethyl (VASCEPA ) 1 g capsule Take 2 capsules (2 g total) by mouth 2 (two) times daily. 360 capsule 3   insulin  lispro (HUMALOG ) 100 UNIT/ML injection USE MAXIMUM 76 UNITS PER   DAY WITH V-GO PUMP 60 mL 3   levothyroxine  (SYNTHROID ) 150 MCG tablet TAKE 1 TABLET BY MOUTH DAILY BEFORE BREAKFAST. 90 tablet 3   mirabegron  ER (MYRBETRIQ )  25 MG TB24 tablet Take 1 tablet (25 mg total) by mouth daily. 90 tablet 3   nitroGLYCERIN  (NITROSTAT ) 0.4 MG SL tablet Place 1 tablet (0.4 mg total) under the tongue every 5 (five) minutes as needed for chest pain. 25 tablet 12   NUPLAZID  34 MG CAPS TAKE 1 CAPSULE BY MOUTH 1 TIME A DAY 90 capsule 0   OVER THE COUNTER MEDICATION Apply 1 application topically daily as needed (pain). Theraworx Pain Cream       polyethylene glycol (MIRALAX  / GLYCOLAX ) packet Take 8.5 g by mouth daily.       [Paused] potassium chloride  SA (KLOR-CON  M) 20 MEQ tablet TAKE 40 MEQ BY MOUTH IN THE MORNING AND TAKE 20 MEQ IN THE EVENING 270 tablet 1   rosuvastatin  (CRESTOR ) 20 MG tablet TAKE 1 TAB BY MOUTH EVERYDAY AT BEDTIME 90 tablet 3   sennosides-docusate sodium  (SENOKOT-S) 8.6-50 MG tablet Take 1 tablet by mouth daily as needed for constipation. Take along with miralax .       spironolactone  (ALDACTONE ) 25 MG tablet TAKE 1/2 TABLET BY MOUTH EVERY DAY 45 tablet 1   tirzepatide  (MOUNJARO ) 7.5 MG/0.5ML Pen Inject 7.5 mg into the skin once a week. 6 mL 3   vitamin B-12 (CYANOCOBALAMIN ) 1000 MCG tablet Take 1,000 mcg by mouth daily.       [DISCONTINUED] furosemide  (LASIX ) 20 MG tablet TAKE 1 (20MG ) TABLET AND 1/2TAB OF THE 80 MG TABLET ONCE DAILY TO EQUAL 60 MG DAILY (Patient taking differently: Take 20 mg by mouth daily.) 30 tablet 0   Continuous Blood Gluc Receiver (DEXCOM G6 RECEIVER) DEVI Use to check blood sugar daily 1 each 0   Continuous Glucose Sensor (DEXCOM G6 SENSOR) MISC USE TO CHECK BLOOD SUGAR DAILY 9 each 3   Continuous Glucose Transmitter (DEXCOM G6 TRANSMITTER) MISC Change every 3 months 1 each 3   FREESTYLE LITE test strip USE TO CHECK BLOOD SUGAR 4 TIMES DAILY. 150 strip 3   [Paused] Insulin  Disposable Pump (OMNIPOD 5 DEXG7G6 PODS GEN 5) MISC CHANGE POD EVERY 3 DAYS 30 each 3   [Paused] Insulin  Disposable Pump (OMNIPOD 5 G6 INTRO, GEN 5,) KIT 1 kit by Does not apply route daily. 1 kit 0   [DISCONTINUED]  DULoxetine  (CYMBALTA ) 20 MG capsule Take 1 capsule (20 mg total) by mouth daily. (Patient not taking: Reported on 11/27/2023) 30 capsule 0            Home: Home Living Family/patient expects to be discharged to:: Private residence Living Arrangements: Spouse/significant other, Children, Other relatives Available Help at Discharge: Family, Available 24 hours/day Type of Home: House Home Access: Ramped entrance Home Layout: One level Bathroom Shower/Tub: Health Visitor: Handicapped height Home Equipment: Agricultural Consultant (2 wheels), Other (comment), Grab bars - toilet, Grab bars - tub/shower, Shower seat - built in, [employer]  held shower head, Transport chair, Lift chair (U-step walker)   Functional History: Prior Function Prior Level of Function : Needs assist Mobility Comments: ambulates with U-step or RW and assistance. sleeps in his lift chair ADLs Comments: set up for eating, grooms with set up, assisted to wash lower body and to dry off, assist for lower body dressing   Functional Status:  Mobility: Bed Mobility Overal bed mobility: Needs Assistance Bed Mobility: Supine to Sit Supine to sit: Max assist, HOB elevated, Used rails Sit to supine: Max assist General bed mobility comments: OOB in recliner Transfers Overall transfer level: Needs assistance Equipment used: Rolling walker (2 wheels) Transfers: Sit to/from Stand Sit to Stand: Mod assist Bed to/from chair/wheelchair/BSC transfer type:: Step pivot Step pivot transfers: Min assist General transfer comment: cues for hand placement and mod assist for first and last stand and min assist for 2nd stand Ambulation/Gait Ambulation/Gait assistance: Min assist Gait Distance (Feet): 40 Feet Assistive device: Rolling walker (2 wheels) Gait Pattern/deviations: Shuffle, Trunk flexed, Decreased stride length, Step-through pattern General Gait Details: unable to progress Gait velocity: reduced Gait velocity  interpretation: <1.31 ft/sec, indicative of household ambulator   ADL: ADL Overall ADL's : Needs assistance/impaired Eating/Feeding: Moderate assistance, Sitting Eating/Feeding Details (indicate cue type and reason): able to feed self finger foods with setup and elbow supported, requires assistance to load utensils and to hold cup Grooming: Wash/dry hands, Wash/dry face, Supervision/safety, Sitting Grooming Details (indicate cue type and reason): in recliner Upper Body Bathing: Maximal assistance, Sitting Lower Body Bathing: Total assistance, +2 for physical assistance, Sit to/from stand Upper Body Dressing : Maximal assistance, Sitting Lower Body Dressing: Maximal assistance Lower Body Dressing Details (indicate cue type and reason): pt requires assist to get started with clothing over feet but increased abililty to transfer sit to stand this am.  Worked on letting go of walker with one hand to pull up pants with other hand in standing. Toilet Transfer: Moderate assistance, Rolling walker (2 wheels), BSC/3in1 Toilet Transfer Details (indicate cue type and reason): step pivot transfer to [facility] with mod assist for sit to stand and min assist for balance with cues for hand placement Toileting- Clothing Manipulation and Hygiene: Maximal assistance, Sit to/from stand, Cueing for compensatory techniques Toileting - Clothing Manipulation Details (indicate cue type and reason): pt working on pulling up pants with one hand while letting go with the other. Functional mobility during ADLs: Minimal assistance, +2 for physical assistance, Rolling walker (2 wheels) General ADL Comments: patient up in recliner and assisted with completing breakfast   Cognition: Cognition Orientation Level: Oriented to person, Oriented to time, Disoriented to place, Disoriented to situation Cognition Arousal: Alert Behavior During Therapy: Flat affect     Blood pressure 110/67, pulse 61, temperature 97.9 F (36.6 C),  temperature source Oral, resp. rate 16, height 5' 9 (1.753 m), weight 115 kg, SpO2 97%.   General: No apparent distress, laying in bed HEENT: Head is normocephalic, atraumatic, sclera anicteric, oral mucosa with white coating. Masked faces Neck: Supple without JVD  Heart: Reg rate and rhythm. No murmurs rubs or gallops Chest: CTA bilaterally without wheezes, rales, or rhonchi; no distress Abdomen: Soft, non-tender, non-distended, bowel sounds positive. Extremities: No clubbing, cyanosis. Trace LE edema Psych: Pt's affect is flat.  Cooperative Skin: Bruising noted on bilateral upper extremities Neuro:     Mental Status: AAOx4, memory deficits present, intermittant confusion, delayed responses, able to provide his date of birth Speech/Languate: Naming and repetition intact, + mod to severe  dysarthria, follows simple commands CRANIAL NERVES: II: PERRL. Visual fields full III, IV, VI: EOM intact, no gaze preference or deviation V: normal sensation bilaterally VII: no asymmetry VIII: normal hearing to speech IX, X: normal palatal elevation XI: head turn intact b/l XII: Tongue midline     MOTOR: RUE: 4/5 Deltoid, 4/5 Biceps, 4/5 Triceps,4/5 Grip LUE: 4/5 Deltoid, 4/5 Biceps, 4/5 Triceps, 4/5 Grip RLE: HF 3/5, KE 4/5, ADF 3/5, APF 3/5 LLE: HF 3/5, KE 4/5, ADF 3/5, APF 3/5   SENSORY: Normal to touch all 4 extremities   Increase tone/rigidity in b/l legs Right greater than left Slightly increased tone in LUE>RUE   + mild tremor RUE      Lab Results Last 48 Hours        Results for orders placed or performed during the hospital encounter of 11/27/23 (from the past 48 hours)  Glucose, capillary     Status: None    Collection Time: 12/03/23  4:31 PM  Result Value Ref Range    Glucose-Capillary 94 70 - 99 mg/dL      Comment: Glucose reference range applies only to samples taken after fasting for at least 8 hours.  Glucose, capillary     Status: Abnormal    Collection Time:  12/03/23  9:11 PM  Result Value Ref Range    Glucose-Capillary 146 (H) 70 - 99 mg/dL      Comment: Glucose reference range applies only to samples taken after fasting for at least 8 hours.    Comment 1 Notify RN    CBC     Status: Abnormal    Collection Time: 12/04/23  5:33 AM  Result Value Ref Range    WBC 6.2 4.0 - 10.5 K/uL    RBC 4.67 4.22 - 5.81 MIL/uL    Hemoglobin 13.4 13.0 - 17.0 g/dL    HCT 58.1 60.9 - 47.9 %    MCV 89.5 80.0 - 100.0 fL    MCH 28.7 26.0 - 34.0 pg    MCHC 32.1 30.0 - 36.0 g/dL    RDW 85.9 88.4 - 84.4 %    Platelets 128 (L) 150 - 400 K/uL    nRBC 0.0 0.0 - 0.2 %      Comment: Performed at [facility], [address] [place], [place] [address]  Basic metabolic panel     Status: Abnormal    Collection Time: 12/04/23  5:33 AM  Result Value Ref Range    Sodium 135 135 - 145 mmol/L    Potassium 3.9 3.5 - 5.1 mmol/L    Chloride 94 (L) 98 - 111 mmol/L    CO2 30 22 - 32 mmol/L    Glucose, Bld 119 (H) 70 - 99 mg/dL      Comment: Glucose reference range applies only to samples taken after fasting for at least 8 hours.    BUN 20 8 - 23 mg/dL    Creatinine, Ser 8.65 (H) 0.61 - 1.24 mg/dL    Calcium  8.5 (L) 8.9 - 10.3 mg/dL    GFR, Estimated 56 (L) >60 mL/min      Comment: (NOTE) Calculated using the CKD-EPI Creatinine Equation (2021)      Anion gap 11 5 - 15      Comment: Performed at [facility], [address], [place], [place] [address]  Glucose, capillary     Status: Abnormal    Collection Time: 12/04/23  6:09 AM  Result Value Ref Range  Glucose-Capillary 131 (H) 70 - 99 mg/dL      Comment: Glucose reference range applies only to samples taken after fasting for at least 8 hours.    Comment 1 Notify RN    Glucose, capillary     Status: Abnormal    Collection Time: 12/04/23 11:58 AM  Result Value Ref Range    Glucose-Capillary 126 (H) 70 - 99 mg/dL      Comment: Glucose reference range applies only to samples taken after fasting for at  least 8 hours.  Glucose, capillary     Status: Abnormal    Collection Time: 12/04/23  4:17 PM  Result Value Ref Range    Glucose-Capillary 129 (H) 70 - 99 mg/dL      Comment: Glucose reference range applies only to samples taken after fasting for at least 8 hours.  Glucose, capillary     Status: Abnormal    Collection Time: 12/04/23  8:51 PM  Result Value Ref Range    Glucose-Capillary 137 (H) 70 - 99 mg/dL      Comment: Glucose reference range applies only to samples taken after fasting for at least 8 hours.  Basic metabolic panel     Status: Abnormal    Collection Time: 12/05/23  1:32 AM  Result Value Ref Range    Sodium 138 135 - 145 mmol/L    Potassium 4.0 3.5 - 5.1 mmol/L    Chloride 94 (L) 98 - 111 mmol/L    CO2 35 (H) 22 - 32 mmol/L    Glucose, Bld 114 (H) 70 - 99 mg/dL      Comment: Glucose reference range applies only to samples taken after fasting for at least 8 hours.    BUN 19 8 - 23 mg/dL    Creatinine, Ser 8.72 (H) 0.61 - 1.24 mg/dL    Calcium  8.5 (L) 8.9 - 10.3 mg/dL    GFR, Estimated >39 >39 mL/min      Comment: (NOTE) Calculated using the CKD-EPI Creatinine Equation (2021)      Anion gap 9 5 - 15      Comment: Performed at [facility], [address], [place], [place] [address]  Glucose, capillary     Status: Abnormal    Collection Time: 12/05/23  5:35 AM  Result Value Ref Range    Glucose-Capillary 126 (H) 70 - 99 mg/dL      Comment: Glucose reference range applies only to samples taken after fasting for at least 8 hours.  Glucose, capillary     Status: Abnormal    Collection Time: 12/05/23 11:47 AM  Result Value Ref Range    Glucose-Capillary 145 (H) 70 - 99 mg/dL      Comment: Glucose reference range applies only to samples taken after fasting for at least 8 hours.    Comment 1 Notify RN      Comment 2 Document in Chart      *Note: Due to a large number of results and/or encounters for the requested time period, some results have not been  displayed. A complete set of results can be found in Results Review.      Imaging Results (Last 48 hours)  No results found.         Blood pressure 110/67, pulse 61, temperature 97.9 F (36.6 C), temperature source Oral, resp. rate 16, height 5' 9 (1.753 m), weight 115 kg, SpO2 97%.   Medical Problem List and Plan: 1. Functional deficits secondary to generalized weakness due  to parkinson disease. Pt has DBS.              -patient may shower             -ELOS/Goals: 14-16, PT sup to CGA, OT mon/mod A, SLP sup to min A             -Admit to CIR 2.  Antithrombotics: -DVT/anticoagulation:  Pharmaceutical: Lovenox              -antiplatelet therapy: ASA 3. Pain Management: Tylenol  prn.  4. Mood/Behavior/Sleep: LCSW to follow for evaluation and support.              -antipsychotic agents: N/A 5. Neuropsych/cognition: This patient may be intermittent  capable of making decisions on his own behalf. 6. Skin/Wound Care: Routine pressure relief measures 7. Fluids/Electrolytes/Nutrition: Strict I/O. Daily weights 8. PD s/p DBS/Autonomic dysfunction: Monitor for orthostatic symptoms with increase in activity             -continue sinemet  ER five times a day.  --Pimavanserin  daily to  manage hallucinations  9. T2DM: Monitor BS ac/hs and use SSI for elevated BS. Monitor with activity             --On Lantus  15 units BId with novolog  4 units TID ac             --titrate as indicated.  10. HFrEF: Strict I/O. Daily weights and monitor for signs of overload.              --Low salt diet. Continue ASA, Lasix , Cozaar , Toprol  XL and aldactone    11. Glaucoma: Managed with cosopt  12. Hyperactive airway disease: On Breo.  13. Thrush: Will add nystatin  mouth wash 14. AKI: SCr on rise to 1.27-->recheck in am.             --Encourage fluid intake. Orthostatic vitals.  15.  Thrombocytopenia: Monitor for signs of bleeding. Recheck CBC in am.   16. Urinary retention: Cathed today for 500 cc--will d/c  mirabegron . Will monitor voiding with PVR checks.  17. Constipation: Has resolved with Miralax  BID 18. Concern for seizures: monitor for signs/symptoms 19. Spasticity/dystonia: patient follows with Dr. Carilyn for botox  outpatient     Sharlet GORMAN Schmitz, PA-C 12/05/2023   I have personally performed a face to face diagnostic evaluation of this patient and formulated the key components of the plan.  Additionally, I have personally reviewed laboratory data, imaging studies, as well as relevant notes and concur with the physician assistant's documentation above.   The patient's status has not changed from the original H&P.  Any changes in documentation from the acute care chart have been noted above.   Murray Collier, MD

## 2023-12-05 NOTE — Plan of Care (Signed)
  Problem: Fluid Volume: Goal: Ability to maintain a balanced intake and output will improve Outcome: Not Progressing   Problem: Metabolic: Goal: Ability to maintain appropriate glucose levels will improve Outcome: Not Progressing   Problem: Tissue Perfusion: Goal: Adequacy of tissue perfusion will improve Outcome: Not Progressing   Problem: Clinical Measurements: Goal: Respiratory complications will improve Outcome: Not Progressing   Problem: Clinical Measurements: Goal: Cardiovascular complication will be avoided Outcome: Not Progressing   Problem: Activity: Goal: Risk for activity intolerance will decrease Outcome: Not Progressing   Problem: Safety: Goal: Ability to remain free from injury will improve Outcome: Not Progressing   Problem: Elimination: Goal: Will not experience complications related to bowel motility Outcome: Not Progressing

## 2023-12-05 NOTE — Progress Notes (Addendum)
 Daughter called stating that patient is not opening mouth. This nurse came to check on patient, he is disoriented in place, time and situation, he is oriented to self only and states that his legs are not moving, of note he has been putting his legs on the edge of the bed. Patient did not have a good sleep for the night. Lights are turned on to re-orient patient that he is not at home but in the hospital. He asked for water and took a sip. DR. Voncile updated, no new orders at the moment.

## 2023-12-05 NOTE — Progress Notes (Signed)
 Physical Therapy Treatment Patient Details Name: Henry [name] MRN: [id number] DOB: [birth year] Today's Date: 12/05/2023   History of Present Illness 73 y.o. male presents to [facility] hospital on 11/27/2023 with generalized weakness. Pt currenlty on continuous EEG. PMH includes Parkinson's disease s/p deep brain stimulator, HFrEF, HTN, DMII, PAF, dysphagia, R foot dystonia s/p botox  injections.    PT Comments  Pt received in supine and agreeable to session. Pt demonstrates functional decline from initial evaluation. Pt not oriented to place despite multiple attempts to reorient. Pt requires increased assist for bed mobility and transfers with pt demonstrating decreased initiation and awareness. Pt requires cues to maintain eyes open for improved awareness throughout session. Pt demonstrates quick fatigue during transfer and requires up to max A for balance and to advance feet to complete pivot to recliner. Pt continues to benefit from PT services to progress toward functional mobility goals.    If plan is discharge home, recommend the following: A lot of help with walking and/or transfers;A lot of help with bathing/dressing/bathroom;Assistance with cooking/housework;Assist for transportation;Help with stairs or ramp for entrance   Can travel by private vehicle        Equipment Recommendations  None recommended by PT    Recommendations for Other Services       Precautions / Restrictions Precautions Precautions: Fall Recall of Precautions/Restrictions: Impaired Restrictions Weight Bearing Restrictions Per Provider Order: No     Mobility  Bed Mobility Overal bed mobility: Needs Assistance Bed Mobility: Supine to Sit     Supine to sit: Max assist, HOB elevated, Used rails     General bed mobility comments: Cues for use of bedrail and assist to bring LUE to rail. Max cues for initiation and assist for BLE management, trunk elevation, and scooting forward to EOB    Transfers Overall  transfer level: Needs assistance Equipment used: Rolling walker (2 wheels) Transfers: Sit to/from Stand, Bed to chair/wheelchair/BSC Sit to Stand: Mod assist           General transfer comment: STS from EOB with first attempt unsuccessful due to retropulsion. Assist for B feet positioning and mod A for rise and anterior lean on second attempt. Pivot to recliner with mod A progressing to max A with fatigue. Assist to advance B feet due to pt stating I can't. Pt maintains trunk flexion despite cues and facilitation of upright posture. decreased eccentric control with pt falling back into recliner requiring assist for safety and guidance    Ambulation/Gait               General Gait Details: unable to progress   Stairs             Wheelchair Mobility     Tilt Bed    Modified Rankin (Stroke Patients Only)       Balance Overall balance assessment: Needs assistance Sitting-balance support: Feet supported, Single extremity supported, Bilateral upper extremity supported Sitting balance-Leahy Scale: Poor Sitting balance - Comments: Close CGA sitting EOB with slight R lateral lean and pt unable to follow cues to push up to midline   Standing balance support: Bilateral upper extremity supported, Reliant on assistive device for balance, During functional activity Standing balance-Leahy Scale: Poor Standing balance comment: reliant on UE and external support                            Communication Communication Communication: Impaired Factors Affecting Communication: Reduced clarity of speech;Hearing impaired  Cognition  Arousal: Alert Behavior During Therapy: Flat affect   PT - Cognitive impairments: Difficult to assess, Problem solving, Orientation, Attention, Initiation, Sequencing, Awareness Difficult to assess due to: Impaired communication                     PT - Cognition Comments: slowed processing and cues to follow commands Following  commands: Impaired Following commands impaired: Follows one step commands with increased time    Cueing Cueing Techniques: Verbal cues, Tactile cues  Exercises      General Comments        Pertinent Vitals/Pain Pain Assessment Pain Assessment: No/denies pain     PT Goals (current goals can now be found in the care plan section) Acute Rehab PT Goals Patient Stated Goal: to return to baseline, ambulating for household distances with use of RW or U-walker PT Goal Formulation: With patient/family Time For Goal Achievement: 12/12/23 Progress towards PT goals: Progressing toward goals    Frequency    Min 2X/week       AM-PAC PT 6 Clicks Mobility   Outcome Measure  Help needed turning from your back to your side while in a flat bed without using bedrails?: A Lot Help needed moving from lying on your back to sitting on the side of a flat bed without using bedrails?: A Lot Help needed moving to and from a bed to a chair (including a wheelchair)?: A Lot Help needed standing up from a chair using your arms (e.g., wheelchair or bedside chair)?: A Lot Help needed to walk in hospital room?: Total Help needed climbing 3-5 steps with a railing? : Total 6 Click Score: 10    End of Session Equipment Utilized During Treatment: Gait belt Activity Tolerance: Patient tolerated treatment well;Patient limited by fatigue Patient left: with call bell/phone within reach;with chair alarm set;in chair Nurse Communication: Mobility status;Other (comment) (functional decline) PT Visit Diagnosis: Other abnormalities of gait and mobility (R26.89);Muscle weakness (generalized) (M62.81);Other symptoms and signs involving the nervous system (R29.898)     Time: 9163-9096 PT Time Calculation (min) (ACUTE ONLY): 27 min  Charges:    $Therapeutic Activity: 23-37 mins PT General Charges $$ ACUTE PT VISIT: 1 Visit                    Darryle George, PTA Acute Rehabilitation Services Secure Chat  Preferred  [phone]    Darryle George 12/05/2023, 9:37 AM

## 2023-12-05 NOTE — TOC Transition Note (Signed)
 Transition of Care [facility]) - Discharge Note   Patient Details  Name: Henry [name] MRN: [id number] Date of Birth: 01-24-1950  [facility] CM/SW Contact:  Andrez JULIANNA George, RN Phone Number: 12/05/2023, 11:14 AM   Clinical Narrative:     Pt is discharging to CIR today. No further needs per CM.   Final next level of care: IP Rehab Facility Barriers to Discharge: No Barriers Identified   Patient Goals and CMS Choice   CMS Medicare.gov Compare Post Acute Care list provided to:: Patient Choice offered to / list presented to : Patient      Discharge Placement                       Discharge Plan and Services Additional resources added to the After Visit Summary for                                       Social Drivers of Health (SDOH) Interventions SDOH Screenings   Food Insecurity: No Food Insecurity (11/28/2023)  Housing: Low Risk  (11/28/2023)  Transportation Needs: No Transportation Needs (11/28/2023)  Utilities: Not At Risk (11/28/2023)  Alcohol Screen: Low Risk  (04/05/2023)  Depression (PHQ2-9): Low Risk  (09/27/2023)  Financial Resource Strain: Low Risk  (04/05/2023)  Physical Activity: Inactive (04/05/2023)  Social Connections: Moderately Isolated (11/28/2023)  Stress: No Stress Concern Present (04/05/2023)  Tobacco Use: Low Risk  (11/28/2023)  Health Literacy: Inadequate Health Literacy (04/05/2023)     Readmission Risk Interventions     No data to display

## 2023-12-05 NOTE — Progress Notes (Signed)
 Occupational Therapy Treatment Patient Details Name: Henry [name] MRN: [id number] DOB: 1950-08-31 Today's Date: 12/05/2023   History of present illness 73 y.o. male presents to [facility] hospital on 11/27/2023 with generalized weakness. Pt currenlty on continuous EEG. PMH includes Parkinson's disease s/p deep brain stimulator, HFrEF, HTN, DMII, PAF, dysphagia, R foot dystonia s/p botox  injections.   OT comments  Patient received up in recliner with family assisting with breakfast.  Patient positioned in recliner to allow self feeding with RUE elbow supported.  Patient able to feed self finger foods with occasional min assist and mod assist with utensil use and cups.  Patient able to wash face and hands following with supervision and verbal cues.  Patient performed 3 sit to stands from recliner with cues for hand placement with mod assist for 1st and 3rd stand and min assist for 2nd stand. Patient appeared fatigued at end of session and family states he has not been sleeping well at night.  Patient will benefit from intensive inpatient follow-up therapy, >3 hours/day.  Acute OT to continue to follow to address established goals to facilitate DC to next venue of care.        If plan is discharge home, recommend the following:  A lot of help with bathing/dressing/bathroom;Assistance with cooking/housework;Assistance with feeding;Direct supervision/assist for medications management;Direct supervision/assist for financial management;Assist for transportation;Help with stairs or ramp for entrance;A lot of help with walking and/or transfers   Equipment Recommendations  None recommended by OT    Recommendations for Other Services      Precautions / Restrictions Precautions Precautions: Fall Recall of Precautions/Restrictions: Impaired Restrictions Weight Bearing Restrictions Per Provider Order: No       Mobility Bed Mobility Overal bed mobility: Needs Assistance             General bed  mobility comments: OOB in recliner    Transfers Overall transfer level: Needs assistance Equipment used: Rolling walker (2 wheels) Transfers: Sit to/from Stand Sit to Stand: Mod assist           General transfer comment: cues for hand placement and mod assist for first and last stand and min assist for 2nd stand     Balance Overall balance assessment: Needs assistance Sitting-balance support: Feet supported, Single extremity supported, Bilateral upper extremity supported Sitting balance-Leahy Scale: Poor Sitting balance - Comments: right laterl leaning in recliner   Standing balance support: Bilateral upper extremity supported, Reliant on assistive device for balance, During functional activity Standing balance-Leahy Scale: Poor Standing balance comment: reliant on UE support                           ADL either performed or assessed with clinical judgement   ADL Overall ADL's : Needs assistance/impaired Eating/Feeding: Moderate assistance;Sitting Eating/Feeding Details (indicate cue type and reason): able to feed self finger foods with setup and elbow supported, requires assistance to load utensils and to hold cup Grooming: Wash/dry hands;Wash/dry face;Supervision/safety;Sitting Grooming Details (indicate cue type and reason): in recliner                               General ADL Comments: patient up in recliner and assisted with completing breakfast    Extremity/Trunk Assessment              Vision       Perception     Praxis     Communication Communication  Communication: Impaired Factors Affecting Communication: Reduced clarity of speech;Hearing impaired   Cognition Arousal: Alert Behavior During Therapy: Flat affect                                 Following commands: Impaired Following commands impaired: Follows one step commands with increased time      Cueing   Cueing Techniques: Verbal cues, Tactile cues   Exercises      Shoulder Instructions       General Comments wife and daughter present during session and supportive    Pertinent Vitals/ Pain       Pain Assessment Pain Assessment: No/denies pain  Home Living                                          Prior Functioning/Environment              Frequency  Min 2X/week        Progress Toward Goals  OT Goals(current goals can now be found in the care plan section)  Progress towards OT goals: Progressing toward goals  Acute Rehab OT Goals OT Goal Formulation: With patient/family Time For Goal Achievement: 12/12/23 Potential to Achieve Goals: Good ADL Goals Pt Will Perform Eating: with set-up;sitting Pt Will Perform Grooming: with set-up;sitting Pt Will Perform Upper Body Bathing: with min assist;sitting Pt Will Perform Upper Body Dressing: with min assist;sitting Pt Will Transfer to Toilet: with min assist;ambulating;bedside commode  Plan      Co-evaluation                 AM-PAC OT 6 Clicks Daily Activity     Outcome Measure   Help from another person eating meals?: A Lot Help from another person taking care of personal grooming?: A Lot Help from another person toileting, which includes using toliet, bedpan, or urinal?: A Lot Help from another person bathing (including washing, rinsing, drying)?: A Lot Help from another person to put on and taking off regular upper body clothing?: A Lot Help from another person to put on and taking off regular lower body clothing?: A Lot 6 Click Score: 12    End of Session Equipment Utilized During Treatment: Gait belt;Rolling walker (2 wheels)  OT Visit Diagnosis: Unsteadiness on feet (R26.81);Other abnormalities of gait and mobility (R26.89);Muscle weakness (generalized) (M62.81);Other symptoms and signs involving cognitive function   Activity Tolerance Patient tolerated treatment well   Patient Left in chair;with call bell/phone within  reach;with chair alarm set;with family/visitor present   Nurse Communication Mobility status        Time: 8997-8966 OT Time Calculation (min): 31 min  Charges: OT General Charges $OT Visit: 1 Visit OT Treatments $Self Care/Home Management : 8-22 mins $Therapeutic Activity: 8-22 mins  Henry [name], OTA Acute Rehabilitation Services  Office [phone]   Henry [name] 12/05/2023, 10:42 AM

## 2023-12-05 NOTE — Progress Notes (Signed)
 Urbano Albright, MD  Physician Physical Medicine and Rehabilitation   PMR Pre-admission    Signed   Date of Service: 12/05/2023 11:18 AM  Related encounter: ED to Hosp-Admission (Current) from 11/27/2023 in [facility]   Signed     Expand All Collapse All  PMR Admission Coordinator Pre-Admission Assessment   Patient: Henry [name] is an 73 y.o., male MRN: [id number] DOB: [birth year] Height: 5' 9 (175.3 cm) Weight: 115 kg   Insurance Information HMO:     PPO:      PCP:      IPA:      80/20:      OTHER:  PRIMARY: Medicare Part A and B      Policy#: [id number]       Subscriber: pt CM Name:       Phone#:      Fax#:  Pre-Cert#: verified Health And Safety Inspector:  Benefits:  Phone #:      Name:  Eff. Date: 09/10/07 A/B     Deduct: $1676      Out of Pocket Max: n/a      Life Max: n/a CIR: 100%      SNF: 20 full days Outpatient: 80%     Co-Pay: 20% Home Health: 100%      Co-Pay:  DME: 80%     Co-Pay: 20% Providers:  SECONDARY: [employer] PPO      Policy#: [id number]     Phone#: [phone]   Financial Counselor:       Phone#:    The "Data Collection Information Summary" for patients in Inpatient Rehabilitation Facilities with attached "Privacy Act Statement-Health Care Records" was provided and verbally reviewed with: Patient and Family   Emergency Contact Information Contact Information       Name Relation Home Work Mobile    Montijo,[name] Spouse [phone]   [phone]    Cranford,Jennifer Daughter     [phone]         Other Contacts       Name Relation Home Work Mobile    Robinson,Glenda Daughter     [phone]           Current Medical History  Patient Admitting Diagnosis: Parkinson's exacerbation    History of Present Illness: Pt is a 73 y/o male with PMH of Parkinson's s/p deep brain stimulator, HFrEF, HTN, DM who presented to [facility] on 11/27/23 with c/o generalized weakness and increasing freezing episodes  over the past 2-3 weeks.  His DBS was turned down at his neurologist's office on 11/13.  MRI recommended but DBS not compatible with MRI.  Neurology following.  Brain imaging negative for acute abnormality.  EEG negative.  Hospital course hypotension, constipation, acute on chronic heart failure, and PNA.  He was started on IV lasix  and cardiology following for medication management.  Therapy ongoing and he has been recommended for CIR.    Complete NIHSS TOTAL: 4   Patient's medical record from [facility] has been reviewed by the rehabilitation admission coordinator and physician.   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   Dysrhythmia      a-fib   GERD (gastroesophageal reflux disease)     Glaucoma      right eye   History of kidney stones      multiple   HLD (hyperlipidemia)     HTN (hypertension)      pt denies 08/19/12   Hyperplasia, prostate     Hypothyroidism     MI (myocardial infarction) (HCC)      Dr Pietro 2000, x3vessels bypass   Neuromuscular disorder (HCC)      Parkinson's Disease   OSA (obstructive sleep apnea)      AHI-28,on CPAP, noncompliant with CPAP   Parkinson disease (HCC)      1999   PONV (postoperative nausea and vomiting)     Restless legs     Shortness of breath      Hx: of at all times   UTI (lower urinary tract infection) 09/15/2012    Klebsiella   Ventral hernia     Walker as ambulation aid      also uses wheelchair at home/when going out          Has the patient had major surgery during 100 days  prior to admission? No   Family History   family history includes Alcoholism in his father; Aneurysm in his mother; Arthritis in an other family member; HIV/AIDS (age of onset: 86) in his brother; Healthy in his child, child, child, sister, and sister; Peripheral vascular disease in an other family member.   Current Medications  Current Medications    Current Facility-Administered Medications:    acetaminophen  (TYLENOL ) tablet 650 mg, 650 mg, Oral, Q6H PRN, 650 mg at 12/02/23 1322 **OR** acetaminophen  (TYLENOL ) suppository 650 mg, 650 mg, Rectal, Q6H PRN, Perri DELENA Meliton Mickey., MD   albuterol  (PROVENTIL ) (2.5 MG/3ML) 0.083% nebulizer solution 2.5 mg, 2.5 mg, Nebulization, Q4H PRN, Perri DELENA Meliton Mickey., MD, 2.5 mg at 12/05/23 0746   allopurinol  (ZYLOPRIM ) tablet 150 mg, 150 mg, Oral, Daily, Perri DELENA Meliton Mickey., MD, 150 mg at 12/05/23 0950   aspirin  EC tablet 81 mg, 81 mg, Oral, Daily, Perri DELENA Meliton Mickey., MD, 81 mg at 12/05/23 9048   Carbidopa -Levodopa  ER (SINEMET  CR) 25-100 MG tablet controlled release 2 tablet, 2 tablet, Oral, 5 X Daily, Perri DELENA Meliton Mickey., MD, 2 tablet at 12/05/23 9048   cholecalciferol  (VITAMIN D3) 25 MCG (1000 UNIT) tablet 2,000 Units, 2,000 Units, Oral, Daily, Perri DELENA Meliton Mickey., MD, 2,000 Units at 12/05/23 9049   cyanocobalamin  (VITAMIN B12) tablet 1,000 mcg, 1,000 mcg, Oral, Daily, Perri DELENA Meliton Mickey., MD, 1,000 mcg at 12/05/23 9048   dorzolamide -timolol  (COSOPT ) 2-0.5 % ophthalmic solution 1 drop, 1 drop, Right Eye, BID, Perri DELENA Meliton Mickey., MD, 1 drop at 12/05/23 0950   DULoxetine  (CYMBALTA ) DR capsule 20 mg, 20 mg, Oral, Daily, Perri DELENA Meliton Mickey., MD, 20 mg at 12/05/23 9048   enoxaparin  (LOVENOX ) injection 50 mg, 50 mg, Subcutaneous, Q24H, Perri DELENA Meliton Mickey., MD, 50 mg at 12/04/23 2044   fluticasone  furoate-vilanterol (BREO ELLIPTA ) 200-25 MCG/ACT 1 puff, 1 puff, Inhalation, Daily, Perri DELENA Meliton Mickey., MD, 1 puff at 12/05/23 0741    furosemide  (  LASIX ) tablet 40 mg, 40 mg, Oral, Daily, Lonni Slain, MD, 40 mg at 12/05/23 9048   icosapent  Ethyl (VASCEPA ) 1 g capsule 2 g, 2 g, Oral, BID, Perri DELENA Meliton Mickey., MD, 2 g at 12/05/23 9048   insulin  aspart (novoLOG ) injection 0-5 Units, 0-5 Units, Subcutaneous, QHS, Powell, A Meliton Mickey., MD   insulin  aspart (novoLOG ) injection 0-9 Units, 0-9 Units, Subcutaneous, TID WC, Perri DELENA Meliton Mickey., MD, 1 Units at 12/05/23 9388   insulin  aspart (novoLOG ) injection 4 Units, 4 Units, Subcutaneous, TID WC, Perri DELENA Meliton Mickey., MD, 4 Units at 12/05/23 0800   insulin  glargine-yfgn (SEMGLEE ) injection 15 Units, 15 Units, Subcutaneous, QHS, Perri DELENA Meliton Mickey., MD, 15 Units at 12/04/23 2052   levothyroxine  (SYNTHROID ) tablet 150 mcg, 150 mcg, Oral, Q0600, Perri DELENA Meliton Mickey., MD, 150 mcg at 12/05/23 0501   losartan  (COZAAR ) tablet 12.5 mg, 12.5 mg, Oral, Daily, Lonni Slain, MD, 12.5 mg at 12/05/23 0951   melatonin tablet 3 mg, 3 mg, Oral, QHS PRN, Opyd, Timothy S, MD, 3 mg at 12/04/23 2043   metoprolol  succinate (TOPROL -XL) 24 hr tablet 25 mg, 25 mg, Oral, Daily, Lonni Slain, MD, 25 mg at 12/05/23 9048   mirabegron  ER (MYRBETRIQ ) tablet 25 mg, 25 mg, Oral, Daily, Perri DELENA Meliton Mickey., MD, 25 mg at 12/05/23 9048   Pimavanserin  Tartrate CAPS 34 mg, 34 mg, Oral, Daily, Perri DELENA Meliton Mickey., MD, 34 mg at 12/05/23 9047   polyethylene glycol (MIRALAX  / GLYCOLAX ) packet 8.5 g, 8.5 g, Oral, BID, Drusilla, Gagan S, MD, 8.5 g at 12/05/23 9050   rosuvastatin  (CRESTOR ) tablet 20 mg, 20 mg, Oral, Daily, Perri DELENA Meliton Mickey., MD, 20 mg at 12/04/23 2043   spironolactone  (ALDACTONE ) tablet 12.5 mg, 12.5 mg, Oral, Daily, Drusilla Sabas RAMAN, MD, 12.5 mg at 12/05/23 9048     Patients Current Diet:  Diet Order                  Diet Carb Modified             Diet Carb Modified Fluid consistency: Thin; Room service appropriate? Yes  Diet effective now                          Precautions / Restrictions Precautions Precautions: Fall Restrictions Weight Bearing Restrictions Per Provider Order: No    Has the patient had 2 or more falls or a fall with injury in the past year? Yes   Prior Activity Level Limited Community (1-2x/wk): ambulates with rollator in community and supervision, min assist overall for ADLs at baseline (per spouse), dependent for iADLs   Prior Functional Level Self Care: Did the patient need help bathing, dressing, using the toilet or eating? Needed some help   Indoor Mobility: Did the patient need assistance with walking from room to room (with or without device)? Needed some help   Stairs: Did the patient need assistance with internal or external stairs (with or without device)? Dependent   Functional Cognition: Did the patient need help planning regular tasks such as shopping or remembering to take medications? Dependent   Patient Information Are you of Hispanic, Latino/a,or Spanish origin?: A. No, not of Hispanic, Latino/a, or Spanish origin What is your race?: A. White Do you need or want an interpreter to communicate with a doctor or health care staff?: 0. No   Patient's Response To:  Health Literacy and Transportation Is the patient able to respond  to health literacy and transportation needs?: Yes Health Literacy - How often do you need to have someone help you when you read instructions, pamphlets, or other written material from your doctor or pharmacy?: Never In the past 12 months, has lack of transportation kept you from medical appointments or from getting medications?: No In the past 12 months, has lack of transportation kept you from meetings, work, or from getting things needed for daily living?: No   Home Assistive Devices / Equipment Home Equipment: Agricultural Consultant (2 wheels), Other (comment), Grab bars - toilet, Grab bars - tub/shower, Shower seat - built in, Hand held shower head, Transport chair, Lift chair  (U-step walker)   Prior Device Use: Indicate devices/aids used by the patient prior to current illness, exacerbation or injury? Walker   Current Functional Level Cognition   Orientation Level: Oriented to person, Disoriented to place, Disoriented to time, Disoriented to situation    Extremity Assessment (includes Sensation/Coordination)   Upper Extremity Assessment: Defer to OT evaluation RUE Deficits / Details: mild tremor RUE Coordination: decreased fine motor, decreased gross motor LUE Deficits / Details: mild tremor LUE Coordination: decreased fine motor, decreased gross motor  Lower Extremity Assessment: Generalized weakness     ADLs   Overall ADL's : Needs assistance/impaired Eating/Feeding: Moderate assistance, Sitting Eating/Feeding Details (indicate cue type and reason): able to feed self finger foods with setup and elbow supported, requires assistance to load utensils and to hold cup Grooming: Wash/dry hands, Wash/dry face, Supervision/safety, Sitting Grooming Details (indicate cue type and reason): in recliner Upper Body Bathing: Maximal assistance, Sitting Lower Body Bathing: Total assistance, +2 for physical assistance, Sit to/from stand Upper Body Dressing : Maximal assistance, Sitting Lower Body Dressing: Maximal assistance Lower Body Dressing Details (indicate cue type and reason): pt requires assist to get started with clothing over feet but increased abililty to transfer sit to stand this am.  Worked on letting go of walker with one hand to pull up pants with other hand in standing. Toilet Transfer: Moderate assistance, Rolling walker (2 wheels), BSC/3in1 Toilet Transfer Details (indicate cue type and reason): step pivot transfer to [facility] with mod assist for sit to stand and min assist for balance with cues for hand placement Toileting- Clothing Manipulation and Hygiene: Maximal assistance, Sit to/from stand, Cueing for compensatory techniques Toileting - Clothing  Manipulation Details (indicate cue type and reason): pt working on pulling up pants with one hand while letting go with the other. Functional mobility during ADLs: Minimal assistance, +2 for physical assistance, Rolling walker (2 wheels) General ADL Comments: patient up in recliner and assisted with completing breakfast     Mobility   Overal bed mobility: Needs Assistance Bed Mobility: Supine to Sit Supine to sit: Max assist, HOB elevated, Used rails Sit to supine: Max assist General bed mobility comments: OOB in recliner     Transfers   Overall transfer level: Needs assistance Equipment used: Rolling walker (2 wheels) Transfers: Sit to/from Stand Sit to Stand: Mod assist Bed to/from chair/wheelchair/BSC transfer type:: Step pivot Step pivot transfers: Min assist General transfer comment: cues for hand placement and mod assist for first and last stand and min assist for 2nd stand     Ambulation / Gait / Stairs / Wheelchair Mobility   Ambulation/Gait Ambulation/Gait assistance: Editor, Commissioning (Feet): 40 Feet Assistive device: Rolling walker (2 wheels) Gait Pattern/deviations: Shuffle, Trunk flexed, Decreased stride length, Step-through pattern General Gait Details: unable to progress Gait velocity: reduced Gait velocity interpretation: <1.31  ft/sec, indicative of household ambulator     Posture / Balance Dynamic Sitting Balance Sitting balance - Comments: right laterl leaning in recliner Balance Overall balance assessment: Needs assistance Sitting-balance support: Feet supported, Single extremity supported, Bilateral upper extremity supported Sitting balance-Leahy Scale: Poor Sitting balance - Comments: right laterl leaning in recliner Standing balance support: Bilateral upper extremity supported, Reliant on assistive device for balance, During functional activity Standing balance-Leahy Scale: Poor Standing balance comment: reliant on UE support     Special  considerations/life events  Oxygen intermittent o2 in acute setting and Special service needs Parkinson's    Previous Home Environment (from acute therapy documentation) Living Arrangements: Spouse/significant other, Children, Other relatives Available Help at Discharge: Family, Available 24 hours/day Type of Home: House Home Layout: One level Home Access: Ramped entrance Bathroom Shower/Tub: Health Visitor: Handicapped height Home Care Services: No   Discharge Living Setting Plans for Discharge Living Setting: Patient's home, Lives with (comment) (spouse and daughter) Type of Home at Discharge: House Discharge Home Layout: One level Discharge Home Access: Ramped entrance Discharge Bathroom Shower/Tub: Walk-in shower Discharge Bathroom Toilet: Handicapped height Discharge Bathroom Accessibility: Yes How Accessible: Accessible via walker Does the patient have any problems obtaining your medications?: No   Social/Family/Support Systems Patient Roles: Spouse Contact Information: [phone] Niels Anticipated Caregiver: Niels armin Nest (dtr)  [phone] Anticipated Caregiver's Contact Information: see above Ability/Limitations of Caregiver: none stated Caregiver Availability: 24/7 Discharge Plan Discussed with Primary Caregiver: Yes Is Caregiver In Agreement with Plan?: Yes Does Caregiver/Family have Issues with Lodging/Transportation while Pt is in Rehab?: No   Goals Patient/Family Goal for Rehab: PT supervision to CGA, OT min/mod assist, SLP supervision to min assist Expected length of stay: 14-16 days Additional Information: Discharge plan: home with spouse and daughter providing 24/7 supervision/assist as at baseline Pt/Family Agrees to Admission and willing to participate: Yes Program Orientation Provided & Reviewed with Pt/Caregiver Including Roles  & Responsibilities: Yes   Decrease burden of Care through IP rehab admission: n/a   Possible need for  SNF placement upon discharge: Not anticipated.  Plan for discharge home with family support 24/7 as prior to admit.    Patient Condition: I have reviewed medical records from [facility], spoken with [facility] team, and patient, spouse, and daughter. I met with patient at the bedside for inpatient rehabilitation assessment.  Patient will benefit from ongoing PT, OT, and SLP, can actively participate in 3 hours of therapy a day 5 days of the week, and can make measurable gains during the admission.  Patient will also benefit from the coordinated team approach during an Inpatient Acute Rehabilitation admission.  The patient will receive intensive therapy as well as Rehabilitation physician, nursing, social worker, and care management interventions.  Due to bladder management, bowel management, safety, skin/wound care, disease management, medication administration, pain management, and patient education the patient requires 24 hour a day rehabilitation nursing.  The patient is currently mod assist with mobility and basic ADLs.  Discharge setting and therapy post discharge at home with home health is anticipated.  Patient has agreed to participate in the Acute Inpatient Rehabilitation Program and will admit today.   Preadmission Screen Completed By:  Reche FORBES Lowers, PT, DPT 12/05/2023 11:17 AM ______________________________________________________________________   Discussed status with Dr. Urbano on 12/05/23  at 11:17 AM  and received approval for admission today.   Admission Coordinator:  Marshawn Ninneman E Jaedan Huttner, PT, time 11:17 AM Pattricia 12/05/23     Assessment/Plan: Diagnosis: weakness related to  parkinson's disease Does the need for close, 24 hr/day Medical supervision in concert with the patient's rehab needs make it unreasonable for this patient to be served in a less intensive setting? Yes Co-Morbidities requiring supervision/potential complications: Constipation, possible seizure, right lower extremity  swelling, diabetes mellitus, hyperlipidemia, asthma, glaucoma, CAD, deconditioning Due to bladder management, bowel management, safety, skin/wound care, disease management, medication administration, pain management, and patient education, does the patient require 24 hr/day rehab nursing? Yes Does the patient require coordinated care of a physician, rehab nurse, PT, OT, and SLP to address physical and functional deficits in the context of the above medical diagnosis(es)? Yes Addressing deficits in the following areas: balance, endurance, locomotion, strength, transferring, bowel/bladder control, bathing, dressing, feeding, grooming, toileting, cognition, speech, language, and psychosocial support Can the patient actively participate in an intensive therapy program of at least 3 hrs of therapy 5 days a week? Yes The potential for patient to make measurable gains while on inpatient rehab is excellent Anticipated functional outcomes upon discharge from inpatient rehab: supervision PT, min assist and mod assist OT, supervision and min assist SLP Estimated rehab length of stay to reach the above functional goals is: 14-16 Anticipated discharge destination: Home 10. Overall Rehab/Functional Prognosis: excellent     MD Signature: Murray Collier           Revision History  Date/Time User Provider Type Action  12/05/2023 12:16 PM Collier Murray, MD Physician Sign  12/05/2023 11:20 AM Butler Reche BRAVO, PT Rehab Admission Coordinator Share  12/05/2023 11:18 AM Butler Reche BRAVO, PT Rehab Admission Coordinator Share  12/04/2023  1:43 PM Butler Reche BRAVO, PT Rehab Admission Coordinator Share   View Details Report

## 2023-12-05 NOTE — TOC Transition Note (Signed)
 Transition of Care [facility]) - Discharge Note   Patient Details  Name: Henry [name] MRN: [id number] Date of Birth: 1950-07-12  [facility] CM/SW Contact:  Almarie CHRISTELLA Goodie, LCSW Phone Number: 12/05/2023, 11:07 AM   Clinical Narrative:   CSW updated by rehab admissions that patient has bed on CIR today, no further inpatient care management needs.    Final next level of care: IP Rehab Facility Barriers to Discharge: Barriers Resolved   Patient Goals and CMS Choice            Discharge Placement                       Discharge Plan and Services Additional resources added to the After Visit Summary for                                       Social Drivers of Health (SDOH) Interventions SDOH Screenings   Food Insecurity: No Food Insecurity (11/28/2023)  Housing: Low Risk  (11/28/2023)  Transportation Needs: No Transportation Needs (11/28/2023)  Utilities: Not At Risk (11/28/2023)  Alcohol Screen: Low Risk  (04/05/2023)  Depression (PHQ2-9): Low Risk  (09/27/2023)  Financial Resource Strain: Low Risk  (04/05/2023)  Physical Activity: Inactive (04/05/2023)  Social Connections: Moderately Isolated (11/28/2023)  Stress: No Stress Concern Present (04/05/2023)  Tobacco Use: Low Risk  (11/28/2023)  Health Literacy: Inadequate Health Literacy (04/05/2023)     Readmission Risk Interventions     No data to display

## 2023-12-05 NOTE — Plan of Care (Signed)
  Problem: Education: Goal: Ability to describe self-care measures that may prevent or decrease complications (Diabetes Survival Skills Education) will improve Outcome: Progressing Goal: Individualized Educational Video(s) Outcome: Progressing   Problem: Coping: Goal: Ability to adjust to condition or change in health will improve Outcome: Progressing   Problem: Fluid Volume: Goal: Ability to maintain a balanced intake and output will improve Outcome: Progressing   Problem: Health Behavior/Discharge Planning: Goal: Ability to identify and utilize available resources and services will improve Outcome: Progressing Goal: Ability to manage health-related needs will improve Outcome: Progressing   Problem: Metabolic: Goal: Ability to maintain appropriate glucose levels will improve Outcome: Progressing   Problem: Nutritional: Goal: Maintenance of adequate nutrition will improve Outcome: Progressing Goal: Progress toward achieving an optimal weight will improve Outcome: Progressing   Problem: Skin Integrity: Goal: Risk for impaired skin integrity will decrease Outcome: Progressing   Problem: Tissue Perfusion: Goal: Adequacy of tissue perfusion will improve Outcome: Progressing   Problem: Education: Goal: Knowledge of General Education information will improve Description: Including pain rating scale, medication(s)/side effects and non-pharmacologic comfort measures Outcome: Progressing   Problem: Health Behavior/Discharge Planning: Goal: Ability to manage health-related needs will improve Outcome: Progressing   Problem: Clinical Measurements: Goal: Ability to maintain clinical measurements within normal limits will improve Outcome: Progressing Goal: Will remain free from infection Outcome: Progressing Goal: Diagnostic test results will improve Outcome: Progressing Goal: Respiratory complications will improve Outcome: Progressing Goal: Cardiovascular complication will  be avoided Outcome: Progressing   Problem: Activity: Goal: Risk for activity intolerance will decrease Outcome: Progressing   Problem: Nutrition: Goal: Adequate nutrition will be maintained Outcome: Progressing   Problem: Coping: Goal: Level of anxiety will decrease Outcome: Progressing   Problem: Elimination: Goal: Will not experience complications related to bowel motility Outcome: Progressing Goal: Will not experience complications related to urinary retention Outcome: Progressing   Problem: Pain Managment: Goal: General experience of comfort will improve and/or be controlled Outcome: Progressing   Problem: Safety: Goal: Ability to remain free from injury will improve Outcome: Progressing   Problem: Skin Integrity: Goal: Risk for impaired skin integrity will decrease Outcome: Progressing   Problem: Education: Goal: Knowledge of disease or condition will improve Outcome: Progressing Goal: Knowledge of secondary prevention will improve (MUST DOCUMENT ALL) Outcome: Progressing Goal: Knowledge of patient specific risk factors will improve (DELETE if not current risk factor) Outcome: Progressing

## 2023-12-05 NOTE — Hospital Course (Addendum)
 SABRA

## 2023-12-06 ENCOUNTER — Inpatient Hospital Stay (HOSPITAL_COMMUNITY)

## 2023-12-06 DIAGNOSIS — R0902 Hypoxemia: Secondary | ICD-10-CM | POA: Diagnosis not present

## 2023-12-06 DIAGNOSIS — Z95810 Presence of automatic (implantable) cardiac defibrillator: Secondary | ICD-10-CM | POA: Diagnosis not present

## 2023-12-06 DIAGNOSIS — J9811 Atelectasis: Secondary | ICD-10-CM | POA: Diagnosis not present

## 2023-12-06 DIAGNOSIS — R0989 Other specified symptoms and signs involving the circulatory and respiratory systems: Secondary | ICD-10-CM | POA: Diagnosis not present

## 2023-12-06 DIAGNOSIS — G20A1 Parkinson's disease without dyskinesia, without mention of fluctuations: Secondary | ICD-10-CM | POA: Diagnosis not present

## 2023-12-06 LAB — COMPREHENSIVE METABOLIC PANEL WITH GFR
ALT: 6 U/L (ref 0–44)
AST: <10 U/L — ABNORMAL LOW (ref 15–41)
Albumin: 3.3 g/dL — ABNORMAL LOW (ref 3.5–5.0)
Alkaline Phosphatase: 58 U/L (ref 38–126)
Anion gap: 10 (ref 5–15)
BUN: 20 mg/dL (ref 8–23)
CO2: 32 mmol/L (ref 22–32)
Calcium: 8.5 mg/dL — ABNORMAL LOW (ref 8.9–10.3)
Chloride: 94 mmol/L — ABNORMAL LOW (ref 98–111)
Creatinine, Ser: 1.15 mg/dL (ref 0.61–1.24)
GFR, Estimated: >60 mL/min (ref >60)
Glucose, Bld: 116 mg/dL — ABNORMAL HIGH (ref 70–99)
Potassium: 3.7 mmol/L (ref 3.5–5.1)
Sodium: 136 mmol/L (ref 135–145)
Total Bilirubin: 0.7 mg/dL (ref 0.0–1.2)
Total Protein: 5.8 g/dL — ABNORMAL LOW (ref 6.5–8.1)

## 2023-12-06 LAB — CBC WITH DIFFERENTIAL/PLATELET
Abs Immature Granulocytes: 0.03 K/uL (ref 0.00–0.07)
Basophils Absolute: 0 K/uL (ref 0.0–0.1)
Basophils Relative: 1 %
Eosinophils Absolute: 0.2 K/uL (ref 0.0–0.5)
Eosinophils Relative: 4 %
HCT: 39.8 % (ref 39.0–52.0)
Hemoglobin: 13.2 g/dL (ref 13.0–17.0)
Immature Granulocytes: 1 %
Lymphocytes Relative: 26 %
Lymphs Abs: 1.4 K/uL (ref 0.7–4.0)
MCH: 29.5 pg (ref 26.0–34.0)
MCHC: 33.2 g/dL (ref 30.0–36.0)
MCV: 88.8 fL (ref 80.0–100.0)
Monocytes Absolute: 0.6 K/uL (ref 0.1–1.0)
Monocytes Relative: 10 %
Neutro Abs: 3.3 K/uL (ref 1.7–7.7)
Neutrophils Relative %: 58 %
Platelets: 121 K/uL — ABNORMAL LOW (ref 150–400)
RBC: 4.48 MIL/uL (ref 4.22–5.81)
RDW: 13.8 % (ref 11.5–15.5)
WBC: 5.6 K/uL (ref 4.0–10.5)
nRBC: 0 % (ref 0.0–0.2)

## 2023-12-06 LAB — GLUCOSE, CAPILLARY
Glucose-Capillary: 118 mg/dL — ABNORMAL HIGH (ref 70–99)
Glucose-Capillary: 109 mg/dL — ABNORMAL HIGH (ref 70–99)
Glucose-Capillary: 161 mg/dL — ABNORMAL HIGH (ref 70–99)
Glucose-Capillary: 152 mg/dL — ABNORMAL HIGH (ref 70–99)

## 2023-12-06 LAB — VITAMIN D 25 HYDROXY (VIT D DEFICIENCY, FRACTURES): Vit D, 25-Hydroxy: 51.96 ng/mL (ref 30–100)

## 2023-12-06 MED ORDER — L-METHYLFOLATE-B6-B12 3-35-2 MG PO TABS
1.0000 | ORAL_TABLET | Freq: Every day | ORAL | Status: DC
  Administered 2023-12-06 – 2023-12-24 (×19): 1 via ORAL
  Filled 2023-12-06 (×19): qty 1

## 2023-12-06 MED ORDER — MAGNESIUM OXIDE -MG SUPPLEMENT 400 (240 MG) MG PO TABS
200.0000 mg | ORAL_TABLET | Freq: Every day | ORAL | Status: DC
  Administered 2023-12-06 – 2023-12-11 (×6): 200 mg via ORAL
  Filled 2023-12-06 (×7): qty 1

## 2023-12-06 NOTE — Progress Notes (Addendum)
 PROGRESS NOTE   Subjective/Complaints: No new complaints this morning Somnolent Cr improved Other labs stable  ROS: +right sided deafness  Objective:   No results found. Recent Labs    12/04/23 0533 12/06/23 0433  WBC 6.2 5.6  HGB 13.4 13.2  HCT 41.8 39.8  PLT 128* 121*   Recent Labs    12/05/23 0132 12/06/23 0433  NA 138 136  K 4.0 3.7  CL 94* 94*  CO2 35* 32  GLUCOSE 114* 116*  BUN 19 20  CREATININE 1.27* 1.15  CALCIUM  8.5* 8.5*    Intake/Output Summary (Last 24 hours) at 12/06/2023 1008 Last data filed at 12/06/2023 0741 Gross per 24 hour  Intake 354 ml  Output --  Net 354 ml        Physical Exam: Vital Signs Blood pressure 122/65, pulse 60, temperature 98 F (36.7 C), resp. rate 20, height 5' 9 (1.753 m), weight 115.1 kg, SpO2 92%. Gen: no distress, normal appearing HEENT: oral mucosa pink and moist, NCAT Cardio: Reg rate Chest: normal effort, normal rate of breathing Abd: soft, non-distended Extremities: No clubbing, cyanosis. Trace LE edema Psych: Pt's affect is flat.  Cooperative Skin: Bruising noted on bilateral upper extremities Neuro:     Mental Status: AAOx4, memory deficits present, intermittant confusion, delayed responses, able to provide his date of birth Speech/Languate: Naming and repetition intact, + mod to severe dysarthria, follows simple commands CRANIAL NERVES: II: PERRL. Visual fields full III, IV, VI: EOM intact, no gaze preference or deviation V: normal sensation bilaterally VII: no asymmetry VIII: normal hearing to speech IX, X: normal palatal elevation XI: head turn intact b/l XII: Tongue midline     MOTOR: RUE: 4/5 Deltoid, 4/5 Biceps, 4/5 Triceps,4/5 Grip LUE: 4/5 Deltoid, 4/5 Biceps, 4/5 Triceps, 4/5 Grip RLE: HF 3/5, KE 4/5, ADF 3/5, APF 3/5 LLE: HF 3/5, KE 4/5, ADF 3/5, APF 3/5   SENSORY: Normal to touch all 4 extremities   Increase tone/rigidity  in b/l legs Right greater than left Slightly increased tone in LUE>RUE   + mild tremor RUE   Assessment/Plan: 1. Functional deficits which require 3+ hours per day of interdisciplinary therapy in a comprehensive inpatient rehab setting. Physiatrist is providing close team supervision and 24 hour management of active medical problems listed below. Physiatrist and rehab team continue to assess barriers to discharge/monitor patient progress toward functional and medical goals  Care Tool:  Bathing              Bathing assist       Upper Body Dressing/Undressing Upper body dressing        Upper body assist      Lower Body Dressing/Undressing Lower body dressing            Lower body assist       Toileting Toileting    Toileting assist       Transfers Chair/bed transfer  Transfers assist           Locomotion Ambulation   Ambulation assist              Walk 10 feet activity   Assist  Walk 50 feet activity   Assist           Walk 150 feet activity   Assist           Walk 10 feet on uneven surface  activity   Assist           Wheelchair     Assist               Wheelchair 50 feet with 2 turns activity    Assist            Wheelchair 150 feet activity     Assist          Blood pressure 122/65, pulse 60, temperature 98 F (36.7 C), resp. rate 20, height 5' 9 (1.753 m), weight 115.1 kg, SpO2 92%.  Medical Problem List and Plan: 1. Functional deficits secondary to generalized weakness due to parkinson disease. Pt has DBS.              -patient may shower             -ELOS/Goals: 14-16, PT sup to CGA, OT mon/mod A, SLP sup to min A             -Cyanocobalmin replaced with metanx for increased bioavailability  2.  Antithrombotics: -DVT/anticoagulation:  Pharmaceutical: continue Lovenox              -antiplatelet therapy: ASA  3. Pain Management: Tylenol  prn.   4.  Mood/Behavior/Sleep: LCSW to follow for evaluation and support.              -antipsychotic agents: N/A  5. Neuropsych/cognition: This patient may be intermittent  capable of making decisions on his own behalf.  6. Skin/Wound Care: Routine pressure relief measures  7. Screening for vitamin D  deficiency: add on vitamin D  level  8. PD s/p DBS/Autonomic dysfunction: Monitor for orthostatic symptoms with increase in activity             -continue sinemet  ER five times a day.  --Pimavanserin  daily to  manage hallucinations   9. T2DM: Monitor BS ac/hs, d/c ISS, provided a list of foods that are good for diabetes             --On Lantus  15 units BId with novolog  4 units TID ac             --titrate as indicated.   10. HFrEF: Strict I/O. Daily weights and monitor for signs of overload.              --Low salt diet. Continue ASA, Lasix , Cozaar , Toprol  XL and aldactone . Asked nursing to please weigh him today  11. Glaucoma: Managed with cosopt  12. Hyperactive airway disease: On Breo.   13. Thrush: Will add nystatin  mouth wash 14. AKI: SCr on rise to 1.27-->recheck in am.             --Encourage fluid intake. Orthostatic vitals.  15.  Thrombocytopenia: Monitor for signs of bleeding. Recheck CBC in am.    16. Urinary retention: d/c mirabegron . Will monitor voiding with PVR checks.   17. Constipation: LBM 11/25, continue miralax  BID, asked nursing to please offer prune juice, magnesium  oxide 200mg  ordered daily  18. Concern for seizures: monitor for signs/symptoms  19. Spasticity/dystonia: patient follows with Dr. Carilyn for botox  outpatient  LOS: 1 days A FACE TO FACE EVALUATION WAS PERFORMED  Margarito Dehaas P Raven Harmes 12/06/2023, 10:08 AM

## 2023-12-06 NOTE — Plan of Care (Signed)
  Problem: Consults Goal: RH GENERAL PATIENT EDUCATION Description: See Patient Education module for education specifics. Outcome: Progressing   Problem: RH BOWEL ELIMINATION Goal: RH STG MANAGE BOWEL WITH ASSISTANCE Description: STG Manage Bowel with supervision  Assistance. Outcome: Progressing   Problem: RH BLADDER ELIMINATION Goal: RH STG MANAGE BLADDER WITH ASSISTANCE Description: STG Manage Bladder With supervision  Assistance Outcome: Progressing   Problem: RH SKIN INTEGRITY Goal: RH STG SKIN FREE OF INFECTION/BREAKDOWN Description: Manage skin free of infection with supervision assistance Outcome: Progressing   Problem: RH SAFETY Goal: RH STG ADHERE TO SAFETY PRECAUTIONS W/ASSISTANCE/DEVICE Description: STG Adhere to Safety Precautions With supervision Assistance/Device. Outcome: Progressing   Problem: RH PAIN MANAGEMENT Goal: RH STG PAIN MANAGED AT OR BELOW PT'S PAIN GOAL Description: <4 w/ prns Outcome: Progressing   Problem: RH KNOWLEDGE DEFICIT GENERAL Goal: RH STG INCREASE KNOWLEDGE OF SELF CARE AFTER HOSPITALIZATION Description: Manage increase knowledge of self care after hospitalization with supervision assistance from family using educational materials provided Outcome: Progressing

## 2023-12-06 NOTE — Progress Notes (Signed)
 Inpatient Rehabilitation Admission Medication Review by a Pharmacist  A complete drug regimen review was completed for this patient to identify any potential clinically significant medication issues.  High Risk Drug Classes Is patient taking? Indication by Medication  Antipsychotic Yes, as an intravenous medication Compazine  - nausea   Anticoagulant Yes Lovenox - VTE prophylaxis  Antibiotic No   Opioid No   Antiplatelet Yes Aspirin - h/o MI, CAD s/p CABG  Hypoglycemics/insulin  Yes Insulin  aspart SSI, semglee  insulin  - DM   Vasoactive Medication Yes Furosemide , losartan , metoprolo XL -  HTN  Chemotherapy No   Other Yes Allopurinol  - gout Breo Ellipta  -COPD Cosopt  eye drops- glaucoma Duloxetine  - depression Icosapent  -lowers triglycerides Levothyroxine -hypothyroid Nystatin  susp- oral thrush Pimavanserin  - Parkinson disease psychosis Sinemet  CR-  Parkinson's disease s/p DBS/Autonomic dysfunction  Vitamin D , magOxide, metanx - supplement      Type of Medication Issue Identified Description of Issue Recommendation(s)  Drug Interaction(s) (clinically significant)     Duplicate Therapy     Allergy     No Medication Administration End Date     Incorrect Dose     Additional Drug Therapy Needed     Significant med changes from prior encounter (inform family/care partners about these prior to discharge). PTA meds: Mounjaro  pen, mirabegron  ER, not resumed .  Insulin  sq pump, KCL po paused.  Restart PTA meds when and if necessary during CIR admission or at time of discharge, if warranted   Communicate to patient /family/ caregiver prior to discharge.   Other       Clinically significant medication issues were identified that warrant physician communication and completion of prescribed/recommended actions by midnight of the next day:  No  Name of provider notified for urgent issues identified:   Provider Method of Notification:     Pharmacist comments:   Time spent  performing this drug regimen review (minutes):  30    Levorn Gaskins, [place] Clinical Pharmacist 12/06/2023 11:31 AM

## 2023-12-06 NOTE — Significant Event (Addendum)
 Rapid Response Event Note   Reason for Call :  Asked to see pt as a second set of eyes d/t AMS.   Per RN and wife at bedside, pt had episode of confusion/agitation. His oxygen via dinamap also dropped to high 80s so he was placed on 2L [place].   Initial Focused Assessment:  Pt lying in bed with eyes closed. He will awaken easily and is able to follow commands, move all extremities, and answer questions. He has a L facial droop and some dysarthria-this is not new.  His pupils are 3 and equal. Lungs clear/diminished. ABD soft/NT. Skin warm and dry.   T-98, HR-62, BP-126/94, RR-18, SpO2-95% on 2L [place], 97% with oxygen off.  Interventions:  EEG Chest xray Plan of Care:  Pt back to baseline per wife and RN at bedside. Await chest xray and EEG results. May wean FiO2 as SpO2 allows. Continue to monitor pt. Please call RRT if further assistance needed.   Event Summary:   MD Notified: Dr. Lorilee notified PTA RRT Call Time:1822 Arrival [phone] End Time:1900  Tish Graeme Piety, RN

## 2023-12-06 NOTE — Progress Notes (Signed)
 Occupational Therapy Assessment and Plan  Patient Details  Name: Henry [name] MRN: [id number] Date of Birth: [birth year]  OT Diagnosis: abnormal posture and muscle weakness (generalized) Rehab Potential: Rehab Potential (ACUTE ONLY): Good ELOS: 3-3.5 weeks   Today's Date: 12/07/2023 OT Individual Time: 8982-8884 OT Individual Time Calculation (min): 58 min     Hospital Problem: Principal Problem:   Parkinson's disease (HCC)   Past Medical History:  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)   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   Dysrhythmia    a-fib   GERD (gastroesophageal reflux disease)    Glaucoma    right eye   History of kidney stones    multiple   HLD (hyperlipidemia)    HTN (hypertension)    pt denies 08/19/12   Hyperplasia, prostate    Hypothyroidism    MI (myocardial infarction) (HCC)    Dr Pietro 2000, x3vessels bypass   Neuromuscular disorder (HCC)    Parkinson's Disease   OSA (obstructive sleep apnea)    AHI-28,on CPAP, noncompliant with CPAP   Parkinson disease (HCC)    1999   PONV (postoperative nausea and vomiting)    Restless legs    Shortness of breath    Hx: of at all times   UTI (lower urinary tract infection) 09/15/2012   Klebsiella   Ventral hernia    Walker as ambulation aid    also uses wheelchair at home/when going out   Past Surgical History:  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   BIOPSY  10/14/2018   Procedure: BIOPSY;  Surgeon: Albertus Gordy HERO, MD;  Location: [facility];  Service:  Gastroenterology;;   CATARACT EXTRACTION W/ INTRAOCULAR LENS IMPLANT     Hx: of right eye   CATARACT EXTRACTION W/ INTRAOCULAR LENS IMPLANT Left 2018   COLONOSCOPY N/A 10/13/2014   Procedure: COLONOSCOPY;  Surgeon: Gordy HERO Albertus, MD;  Location: [facility];  Service: Gastroenterology;  Laterality: N/A;   COLONOSCOPY W/ BIOPSIES AND POLYPECTOMY     Hx: of   COLONOSCOPY WITH PROPOFOL  N/A 10/14/2018   Procedure: COLONOSCOPY WITH PROPOFOL ;  Surgeon: Albertus Gordy HERO, MD;  Location: [facility];  Service: Gastroenterology;  Laterality: N/A;   CORONARY ARTERY BYPASS GRAFT  2000   Sudie Laine, MD   CORONARY STENT PLACEMENT  1998   DEEP BRAIN STIMULATOR PLACEMENT  2004   Right and left VIN stimulator placement (parkinsons)   EYE SURGERY     Cataract removed in 1980s related to wood in eye when 73 years old   FINGER AMPUTATION     left pointer   IMPLANTABLE CARDIOVERTER DEFIBRILLATOR IMPLANT N/A 11/13/2013   Procedure: IMPLANTABLE CARDIOVERTER DEFIBRILLATOR IMPLANT;  Surgeon: Danelle LELON Waddell, MD;  Location: [facility] CATH LAB;  Service: Cardiovascular;  Laterality: N/A;   INSERT / REPLACE / REMOVE PACEMAKER     medtronic   LEFT AND RIGHT HEART CATHETERIZATION WITH CORONARY ANGIOGRAM N/A 09/24/2013  Procedure: LEFT AND RIGHT HEART CATHETERIZATION WITH CORONARY ANGIOGRAM;  Surgeon: Lonni JONETTA Cash, MD;  Location: [facility] C[facility] LAB;  Service: Cardiovascular;  Laterality: N/A;   LITHOTRIPSY     3 different times   POLYPECTOMY  10/14/2018   Procedure: POLYPECTOMY;  Surgeon: Albertus Gordy HERO, MD;  Location: [facility];  Service: Gastroenterology;;   PULSE GENERATOR IMPLANT Right 11/13/2017   Procedure: Right chest implantable pulse generator change;  Surgeon: Unice Pac, MD;  Location: [facility] OR;  Service: Neurosurgery;  Laterality: Right;  Right chest implantable pulse generator change   SUBTHALAMIC STIMULATOR BATTERY REPLACEMENT N/A 09/05/2012   Procedure: Deep brain stimulator battery change;  Surgeon:  Pac Unice, MD;  Location: [facility];  Service: Neurosurgery;  Laterality: N/A;  Deep brain stimulator battery change   SUBTHALAMIC STIMULATOR BATTERY REPLACEMENT N/A 06/10/2015   Procedure: Deep Brain stimulator battery change;  Surgeon: Pac Unice, MD;  Location: [facility];  Service: Neurosurgery;  Laterality: N/A;   SUBTHALAMIC STIMULATOR BATTERY REPLACEMENT Right 09/25/2019   Procedure: Deep brain stimulator battery change;  Surgeon: Unice Pac, MD;  Location: [facility] OR;  Service: Neurosurgery;  Laterality: Right;   SUBTHALAMIC STIMULATOR BATTERY REPLACEMENT Right 12/15/2022   Procedure: IMPLANTABLE PULSE GENERATOR  BATTERY REPLACEMENT;  Surgeon: Carollee Lani BROCKS, DO;  Location: [facility] OR;  Service: Neurosurgery;  Laterality: Right;  3C   TONSILLECTOMY      Assessment & Plan Clinical Impression: Patient is a  73 year old male with history of PD s/p DBS, autonomic dysfunction,  T2DM, Chronic systolic and diastolic CHF, ICM w/chronic systolic/diastolic HF, PAF- not on AC due to falls, OSA, HOH, MI, recent episodes of staring with poor responsiveness and DBS adjusted. He  was admitted on 11/27/23 with reports of staring BLE edema, episodes, with decrease in LOC and leaning to the right. left sided weakness and difficulty walking. BLE dopplers negative for DVT. CT head repeated and negative for acute changes.  LT-EEG ordered and showed mild generalized dysfunction without epileptiform discharge or seizures.  BP meds held but lasix  resumed due to SOB with evidence of overload.   CT A/P ordered due to issues with abdominal pain and showed new consolidation of RML  and BLE , chronic cholelithiasis and redundant large bowel with retained stool and laxatives added. Cardiology consulted on 11/24 for input and recommended continuing oral lasix  and BB resumed with recommendations to monitor BP closely due to history of autonomic dysfunction. BLE edema has resolved. He has had issues fatigue and weakness.  Wife reports hallucinations with agitation last night. Patient transferred to CIR on 12/05/2023 .    Patient currently requires max with basic self-care skills secondary to muscle weakness, impaired timing and sequencing, unbalanced muscle activation, decreased coordination, and decreased motor planning, decreased awareness, decreased problem solving, decreased safety awareness, decreased memory, and delayed processing, and decreased sitting balance, decreased standing balance, decreased postural control, and decreased balance strategies.  Prior to hospitalization, patient could complete UB ADL with SBA, LB ADL with Total Assist.  Patient will benefit from skilled intervention to decrease level of assist with basic self-care skills prior to discharge home with care partner.  Anticipate patient will require intermittent supervision and min for UB ADL and follow up home health.  OT - End of Session Activity Tolerance: Decreased this session Endurance Deficit: Yes Endurance Deficit Description: closed eyes intermittently throughout session, mouth hanging open, and appeared fatigued OT Assessment Rehab Potential (ACUTE ONLY): Good OT Barriers to Discharge: Incontinence OT Patient demonstrates  impairments in the following area(s): Balance;Cognition;Endurance;Motor;Safety;Perception OT Basic ADL's Functional Problem(s): Eating;Grooming;Bathing;Dressing;Toileting OT Advanced ADL's Functional Problem(s): None OT Transfers Functional Problem(s): Toilet;Tub/Shower OT Additional Impairment(s): Fuctional Use of Upper Extremity OT Plan OT Intensity: Minimum of 1-2 x/day, 45 to 90 minutes OT Frequency: 5 out of 7 days OT Duration/Estimated Length of Stay: 3-3.5 weeks OT Treatment/Interventions: Balance/vestibular training;Neuromuscular re-education;Self Care/advanced ADL retraining;Therapeutic Exercise;DME/adaptive equipment instruction;UE/LE Strength taining/ROM;Community reintegration;Patient/family  education;UE/LE Coordination activities;Discharge planning;Functional mobility training;Therapeutic Activities OT Self Feeding Anticipated Outcome(s): Set-up OT Basic Self-Care Anticipated Outcome(s): Min A UB OT Toileting Anticipated Outcome(s): Min A OT Bathroom Transfers Anticipated Outcome(s): Min A OT Recommendation Recommendations for Other Services: None Patient destination: Home Follow Up Recommendations: Home health OT Equipment Recommended: None recommended by OT;To be determined   OT Evaluation Precautions/Restrictions  Precautions Precautions: Fall Recall of Precautions/Restrictions: Impaired Precaution/Restrictions Comments: Parkinson's, deep brain stimulator, R ear deaf Restrictions Weight Bearing Restrictions Per Provider Order: No Pain Pain Assessment Pain Scale: 0-10 Pain Score: 0-No pain Home Living/Prior Functioning Home Living Family/patient expects to be discharged to:: Private residence Living Arrangements: Spouse/significant other Available Help at Discharge: Family, Available 24 hours/day, Available PRN/intermittently (daughter and son in law work) Type of Home: House Home Access: Ramped entrance Home Layout: One level Bathroom Shower/Tub: Heritage Manager Toilet: Handicapped height Bathroom Accessibility: Yes Additional Comments: Uses RW to walk into bathroom only. Otherwise uses U walker  Lives With: Spouse, Family (daughter and son in social worker) Prior Function Level of Independence: Needs assistance with ADLs, Requires assistive device for independence Bath: Moderate Toileting: Moderate Dressing: Moderate  Able to Take Stairs?: No Driving: No Vision Baseline Vision/History: 1 Wears glasses Ability to See in Adequate Light: 1 Impaired Patient Visual Report: No change from baseline Vision Assessment?: No apparent visual deficits Additional Comments: wife states that pt does not have glaucoma diagnosis. He does receive eye drop to manage his  eye pressure. Perception  Perception: Not tested Praxis Praxis: Impaired Praxis Impairment Details: Motor planning;Organization Praxis-Other Comments: d/t parkinsons Cognition Cognition Overall Cognitive Status: Impaired/Different from baseline Arousal/Alertness: Awake/alert Orientation Level: Person Memory: Impaired Memory Impairment: Retrieval deficit;Decreased long term memory Awareness: Impaired Problem Solving: Impaired Safety/Judgment: Other (comment) Comments: limited due to inability to communicate with therapist Brief Interview for Mental Status (BIMS) Repetition of Three Words (First Attempt): 3 Temporal Orientation: Year: Correct Temporal Orientation: Month: Accurate within 5 days Temporal Orientation: Day: Correct Recall: Sock: No, could not recall Recall: Blue: Yes, no cue required Recall: Bed: Yes, no cue required BIMS Summary Score: 13 Sensation Sensation Light Touch: Appears Intact Hot/Cold: Not tested Proprioception: Appears Intact Stereognosis: Not tested Coordination Gross Motor Movements are Fluid and Coordinated: No Fine Motor Movements are Fluid and Coordinated: No Coordination and Movement Description: impaired d/t parkinsons Finger Nose Finger Test: NT 9 Hole Peg Test: NT Motor  Motor Motor: Abnormal tone;Abnormal postural alignment and control Motor - Skilled Clinical Observations: poor attention, awareness, initiation, motor planning/sequencing  Trunk/Postural Assessment  Cervical Assessment Cervical Assessment: Exceptions to [facility] (forward head) Thoracic Assessment Thoracic Assessment: Exceptions to [facility] (rounded shoulders) Lumbar Assessment Lumbar Assessment: Exceptions to [facility] (posterior pelvic tilt) Postural Control Postural Control: Deficits on evaluation Trunk Control: slight posterior lean while sitting EOB Righting Reactions: delayed and inadequate Protective Responses: delayed and inadequate  Balance Balance Balance  Assessed: Yes Static Sitting Balance Static Sitting - Balance Support: No upper extremity supported;Feet supported Static Sitting - Level of Assistance: 4: Min assist Dynamic Sitting Balance Dynamic Sitting - Balance Support: Feet supported;Left upper extremity  supported Dynamic Sitting - Level of Assistance: 2: Max Oncologist Standing - Balance Support: Bilateral upper extremity supported;During functional activity Static Standing - Level of Assistance: 3: Mod assist (standing at sink for LB bathing/dressing) Static Standing - Comment/# of Minutes: standing in [place] with BUE support on bar Extremity/Trunk Assessment RUE Assessment Active Range of Motion (AROM) Comments: slight increased tone and rigidity. ROM greatly effected by parkinsons General Strength Comments: 3/5 strength overall. LUE Assessment Active Range of Motion (AROM) Comments: slight increased tone and rigidity. ROM greatly effected by parkinsons General Strength Comments: 3/5 overall  Care Tool Care Tool Self Care Eating   Eating Assist Level: Moderate Assistance - Patient 50 - 74%    Oral Care    Oral Care Assist Level: Moderate Assistance - Patient 50 - 74%    Bathing   Body parts bathed by patient: Face;Right arm;Left arm;Chest Body parts bathed by helper: Abdomen;Front perineal area;Buttocks;Right upper leg;Left upper leg;Right lower leg;Left lower leg   Assist Level: Maximal Assistance - Patient 24 - 49%    Upper Body Dressing(including orthotics)   What is the patient wearing?: Pull over shirt   Assist Level: Maximal Assistance - Patient 25 - 49%    Lower Body Dressing (excluding footwear)   What is the patient wearing?: Pants;Incontinence brief Assist for lower body dressing: Dependent - Patient 0%    Putting on/Taking off footwear   What is the patient wearing?: Non-skid slipper socks Assist for footwear: Dependent - Patient 0%       Care Tool Toileting Toileting  activity   Assist for toileting: Dependent - Patient 0%     Care Tool Bed Mobility    Sit to lying activity   Sit to lying assist level: Maximal Assistance - Patient 25 - 49%        Care Tool Transfers Sit to stand transfer   Sit to stand assist level: Moderate Assistance - Patient 50 - 74%    Chair/bed transfer   Chair/bed transfer assist level: Dependent - mechanical lift     Toilet transfer   Assist Level: Dependent - Patient 0%     Care Tool Cognition  Expression of Ideas and Wants Expression of Ideas and Wants: 3. Some difficulty - exhibits some difficulty with expressing needs and ideas (e.g, some words or finishing thoughts) or speech is not clear  Understanding Verbal and Non-Verbal Content Understanding Verbal and Non-Verbal Content: 3. Usually understands - understands most conversations, but misses some part/intent of message. Requires cues at times to understand   Memory/Recall Ability Memory/Recall Ability : That he or she is in a hospital/hospital unit   Refer to Care Plan for Long Term Goals  SHORT TERM GOAL WEEK 1 OT Short Term Goal 1 (Week 1): Pt will complete UB bathing with Mod A. OT Short Term Goal 2 (Week 1): Pt will complete UB dressing with Mod A. OT Short Term Goal 3 (Week 1): Pt will complete step pivot transfer from bed <> WC with Mod Ax1 and RW if needed.  Recommendations for other services: None    Skilled Therapeutic Intervention ADL ADL Eating: Moderate assistance (using built up foam. provided during eval) Where Assessed-Eating: Wheelchair Grooming: Minimal assistance Where Assessed-Grooming: Sitting at sink Upper Body Bathing: Maximal assistance Where Assessed-Upper Body Bathing: Sitting at sink Lower Body Bathing: Dependent Where Assessed-Lower Body Bathing: Sitting at sink Upper Body Dressing: Maximal assistance Where Assessed-Upper Body Dressing: Wheelchair Lower Body Dressing: Dependent Where Assessed-Lower Body Dressing:  Wheelchair Toileting: Dependent Where Assessed-Toileting: Bed level Toilet Transfer: Dependent Toilet Transfer Method: Other (comment) (stedy) Tub/Shower Transfer: Not assessed Film/video Editor: Not assessed Mobility  Bed Mobility Bed Mobility: Sit to Supine Rolling Right: 2 Helpers Supine to Sit: 2 Helpers Sit to Supine: Maximal Assistance - Patient 25-49% Transfers Sit to Stand: Moderate Assistance - Patient 50-74% (at sink) Stand to Sit: Moderate Assistance - Patient 50-74% (at sink) Skilled Intervention Evaluation completed (see details above) with education on OT POC and goals and individual treatment initiated with focus on functional mobility/transfers, ADL re-training,  generalized strengthening and endurance, dynamic standing balance/coordination, pt/family education and discharge planning. Pt education provided on therapy schedule and safety policy with use of chair alarm. Pt participated in goal setting. Pt participated in modified ADL task (See above for functional performance).    Discharge Criteria: Patient will be discharged from OT if patient refuses treatment 3 consecutive times without medical reason, if treatment goals not met, if there is a change in medical status, if patient makes no progress towards goals or if patient is discharged from hospital.  The above assessment, treatment plan, treatment alternatives and goals were discussed and mutually agreed upon: by patient and by family  Leita Howell, OTR/L,CBIS  Supplemental OT - [facility] Secure Chat Preferred   12/07/2023, 2:25 PM

## 2023-12-06 NOTE — Progress Notes (Signed)
 Patients oxygen saturation dropped to the 80s. Patient presents with altered mental status, blank stare, and delayed responses. MD and rapid response notified. 2 L oxygen applied per MD order and sustaining at 97%. Rapid response will continue to monitor throughout the night.

## 2023-12-07 ENCOUNTER — Telehealth (HOSPITAL_COMMUNITY): Payer: Self-pay | Admitting: Pharmacy Technician

## 2023-12-07 ENCOUNTER — Other Ambulatory Visit (HOSPITAL_COMMUNITY): Payer: Self-pay

## 2023-12-07 ENCOUNTER — Inpatient Hospital Stay (HOSPITAL_COMMUNITY)

## 2023-12-07 ENCOUNTER — Inpatient Hospital Stay (HOSPITAL_COMMUNITY): Attending: Physical Medicine and Rehabilitation

## 2023-12-07 DIAGNOSIS — R29818 Other symptoms and signs involving the nervous system: Secondary | ICD-10-CM | POA: Diagnosis not present

## 2023-12-07 DIAGNOSIS — G20A1 Parkinson's disease without dyskinesia, without mention of fluctuations: Secondary | ICD-10-CM | POA: Diagnosis not present

## 2023-12-07 LAB — GLUCOSE, CAPILLARY
Glucose-Capillary: 135 mg/dL — ABNORMAL HIGH (ref 70–99)
Glucose-Capillary: 117 mg/dL — ABNORMAL HIGH (ref 70–99)
Glucose-Capillary: 148 mg/dL — ABNORMAL HIGH (ref 70–99)
Glucose-Capillary: 127 mg/dL — ABNORMAL HIGH (ref 70–99)

## 2023-12-07 MED ORDER — FUROSEMIDE 20 MG PO TABS
10.0000 mg | ORAL_TABLET | Freq: Once | ORAL | Status: AC
  Administered 2023-12-07: 10 mg via ORAL
  Filled 2023-12-07: qty 1

## 2023-12-07 MED ORDER — METFORMIN HCL 500 MG PO TABS
500.0000 mg | ORAL_TABLET | Freq: Every day | ORAL | Status: DC
  Administered 2023-12-08 – 2023-12-24 (×17): 500 mg via ORAL
  Filled 2023-12-07 (×17): qty 1

## 2023-12-07 MED ORDER — CARBIDOPA-LEVODOPA ER 25-100 MG PO TBCR
2.0000 | EXTENDED_RELEASE_TABLET | Freq: Every day | ORAL | Status: DC
  Administered 2023-12-07 – 2023-12-24 (×85): 2 via ORAL
  Filled 2023-12-07 (×90): qty 2

## 2023-12-07 MED ORDER — ENSURE MAX PROTEIN PO LIQD
11.0000 [oz_av] | Freq: Two times a day (BID) | ORAL | Status: DC
  Administered 2023-12-07 – 2023-12-19 (×19): 11 [oz_av] via ORAL

## 2023-12-07 NOTE — Evaluation (Signed)
 Physical Therapy Assessment and Plan  Patient Details  Name: Henry [name] MRN: [id number] Date of Birth: 1950/02/26  PT Diagnosis: Abnormal posture, Abnormality of gait, Cognitive deficits, Coordination disorder, Difficulty walking, Dizziness and giddiness, Edema, Impaired cognition, Impaired sensation, Muscle spasms, and Muscle weakness Rehab Potential: Fair ELOS: 3-3.5 weeks   Today's Date: 12/07/2023 PT Individual Time: 9153-9042 PT Individual Time Calculation (min): 71 min    Hospital Problem: Principal Problem:   Parkinson's disease (HCC)   Past Medical History:  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)   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   Dysrhythmia    a-fib   GERD (gastroesophageal reflux disease)    Glaucoma    right eye   History of kidney stones    multiple   HLD (hyperlipidemia)    HTN (hypertension)    pt denies 08/19/12   Hyperplasia, prostate    Hypothyroidism    MI (myocardial infarction) (HCC)    Dr Pietro 2000, x3vessels bypass   Neuromuscular disorder (HCC)    Parkinson's Disease   OSA (obstructive sleep apnea)    AHI-28,on CPAP, noncompliant with CPAP   Parkinson disease (HCC)    1999   PONV (postoperative nausea and vomiting)    Restless legs    Shortness of breath    Hx: of at all times   UTI (lower urinary tract infection) 09/15/2012   Klebsiella   Ventral hernia    Walker as ambulation aid    also uses wheelchair at home/when going out   Past Surgical History:  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    BIOPSY  10/14/2018   Procedure: BIOPSY;  Surgeon: Albertus Gordy HERO, MD;  Location: [facility];  Service: Gastroenterology;;   CATARACT EXTRACTION W/ INTRAOCULAR LENS IMPLANT     Hx: of right eye   CATARACT EXTRACTION W/ INTRAOCULAR LENS IMPLANT Left 2018   COLONOSCOPY N/A 10/13/2014   Procedure: COLONOSCOPY;  Surgeon: Gordy HERO Albertus, MD;  Location: [facility];  Service: Gastroenterology;  Laterality: N/A;   COLONOSCOPY W/ BIOPSIES AND POLYPECTOMY     Hx: of   COLONOSCOPY WITH PROPOFOL  N/A 10/14/2018   Procedure: COLONOSCOPY WITH PROPOFOL ;  Surgeon: Albertus Gordy HERO, MD;  Location: [facility];  Service: Gastroenterology;  Laterality: N/A;   CORONARY ARTERY BYPASS GRAFT  2000   Sudie Laine, MD   CORONARY STENT PLACEMENT  1998   DEEP BRAIN STIMULATOR PLACEMENT  2004   Right and left VIN stimulator placement (parkinsons)   EYE SURGERY     Cataract removed in 1980s related to wood in eye when 73 years old   FINGER AMPUTATION     left pointer   IMPLANTABLE CARDIOVERTER DEFIBRILLATOR IMPLANT N/A 11/13/2013   Procedure: IMPLANTABLE CARDIOVERTER DEFIBRILLATOR IMPLANT;  Surgeon: Danelle LELON Waddell, MD;  Location: [facility] CATH LAB;  Service: Cardiovascular;  Laterality: N/A;   INSERT / REPLACE / REMOVE PACEMAKER  medtronic   LEFT AND RIGHT HEART CATHETERIZATION WITH CORONARY ANGIOGRAM N/A 09/24/2013   Procedure: LEFT AND RIGHT HEART CATHETERIZATION WITH CORONARY ANGIOGRAM;  Surgeon: Lonni JONETTA Cash, MD;  Location: [facility] CATH LAB;  Service: Cardiovascular;  Laterality: N/A;   LITHOTRIPSY     3 different times   POLYPECTOMY  10/14/2018   Procedure: POLYPECTOMY;  Surgeon: Albertus Gordy HERO, MD;  Location: [facility];  Service: Gastroenterology;;   PULSE GENERATOR IMPLANT Right 11/13/2017   Procedure: Right chest implantable pulse generator change;  Surgeon: Unice Pac, MD;  Location: [facility] OR;  Service: Neurosurgery;  Laterality: Right;  Right chest implantable pulse generator change   SUBTHALAMIC  STIMULATOR BATTERY REPLACEMENT N/A 09/05/2012   Procedure: Deep brain stimulator battery change;  Surgeon: Pac Unice, MD;  Location: [facility];  Service: Neurosurgery;  Laterality: N/A;  Deep brain stimulator battery change   SUBTHALAMIC STIMULATOR BATTERY REPLACEMENT N/A 06/10/2015   Procedure: Deep Brain stimulator battery change;  Surgeon: Pac Unice, MD;  Location: [facility];  Service: Neurosurgery;  Laterality: N/A;   SUBTHALAMIC STIMULATOR BATTERY REPLACEMENT Right 09/25/2019   Procedure: Deep brain stimulator battery change;  Surgeon: Unice Pac, MD;  Location: [facility] OR;  Service: Neurosurgery;  Laterality: Right;   SUBTHALAMIC STIMULATOR BATTERY REPLACEMENT Right 12/15/2022   Procedure: IMPLANTABLE PULSE GENERATOR  BATTERY REPLACEMENT;  Surgeon: Carollee Lani BROCKS, DO;  Location: [facility] OR;  Service: Neurosurgery;  Laterality: Right;  3C   TONSILLECTOMY      Assessment & Plan Clinical Impression: Patient is a 73 y.o. year old male with history of PD s/p DBS, autonomic dysfunction,  T2DM, Chronic systolic and diastolic CHF, ICM w/chronic systolic/diastolic HF, PAF- not on AC due to falls, OSA, HOH, MI, recent episodes of staring with poor responsiveness and DBS adjusted. He  was admitted on 11/27/23 with reports of staring BLE edema, episodes, with decrease in LOC and leaning to the right. left sided weakness and difficulty walking. BLE dopplers negative for DVT. CT head repeated and negative for acute changes.  LT-EEG ordered and showed mild generalized dysfunction without epileptiform discharge or seizures.  BP meds held but lasix  resumed due to SOB with evidence of overload.   CT A/P ordered due to issues with abdominal pain and showed new consolidation of RML  and BLE , chronic cholelithiasis and redundant large bowel with retained stool and laxatives added. Cardiology consulted on 11/24 for input and recommended continuing oral lasix  and BB resumed with recommendations to monitor BP  closely due to history of autonomic dysfunction. BLE edema has resolved. He has had issues fatigue and weakness. Wife reports hallucinations with agitation last night. Noted to have urinary retention today.  Constipation improved with laxatives. PT/OT has been working with patient who requires supervision to mod assist with ADLs and mod to max assist for pregait activity. CIR recommended due to functional decline.   Patient currently requires total A +2 with mobility secondary to muscle weakness, decreased cardiorespiratoy endurance, impaired timing and sequencing, abnormal tone, unbalanced muscle activation, decreased coordination, and decreased motor planning, decreased initiation, decreased attention, decreased awareness, decreased problem solving, decreased safety awareness, decreased memory, and delayed processing, and decreased sitting balance, decreased standing balance, decreased postural control, and decreased balance strategies.  Prior to hospitalization, patient was min with mobility and lived with Spouse, Family (daughter and son in social worker) in a House home.  Home access is  Ramped entrance.  Patient will benefit from skilled PT intervention to maximize safe functional mobility, minimize  fall risk, and decrease caregiver burden for planned discharge home with 24 hour assist.  Anticipate patient will benefit from follow up HH at discharge.  PT - End of Session Activity Tolerance: Tolerates 30+ min activity with multiple rests Endurance Deficit: Yes Endurance Deficit Description: closed eyes intermittently throughout session, mouth hanging open, and appeared fatigued PT Assessment Rehab Potential (ACUTE/IP ONLY): Fair PT Barriers to Discharge: Home environment access/layout;Decreased caregiver support;Incontinence;Weight;Other (comments) PT Barriers to Discharge Comments: Hx of Parkinsons, impaired communication, impaired initiation, attention, awareness, decreased balanace/coordination, and  impaired motor planning/sequencing PT Patient demonstrates impairments in the following area(s): Balance;Behavior;Edema;Endurance;Motor;Nutrition;Perception;Safety;Sensory;Skin Integrity PT Transfers Functional Problem(s): Bed Mobility;Bed to Chair;Car;Furniture PT Locomotion Functional Problem(s): Ambulation;Wheelchair Mobility;Stairs PT Plan PT Intensity: Minimum of 1-2 x/day ,45 to 90 minutes PT Frequency: 5 out of 7 days PT Duration Estimated Length of Stay: 3-3.5 weeks PT Treatment/Interventions: Ambulation/gait training;Discharge planning;Functional mobility training;Psychosocial support;Therapeutic Activities;Visual/perceptual remediation/compensation;Balance/vestibular training;Disease management/prevention;Neuromuscular re-education;Skin care/wound management;Therapeutic Exercise;Wheelchair propulsion/positioning;Cognitive remediation/compensation;DME/adaptive equipment instruction;Pain management;Splinting/orthotics;UE/LE Strength taining/ROM;Community reintegration;Functional electrical stimulation;Patient/family education;UE/LE Coordination activities PT Transfers Anticipated Outcome(s): CGA with LRAD PT Locomotion Anticipated Outcome(s): CGA with LRAD PT Recommendation Follow Up Recommendations: Home health PT Patient destination: Home Equipment Recommended: To be determined Equipment Details: has RW and U walker   PT Evaluation Precautions/Restrictions Precautions Precautions: Fall Recall of Precautions/Restrictions: Impaired Precaution/Restrictions Comments: Parkinson's, deep brain stimulator Restrictions Weight Bearing Restrictions Per Provider Order: No Pain Interference Pain Interference Pain Effect on Sleep: 8. Unable to answer Pain Interference with Therapy Activities: 8. Unable to answer Pain Interference with Day-to-Day Activities: 8. Unable to answer Home Living/Prior Functioning Home Living Available Help at Discharge: Family;Available 24 hours/day;Available  PRN/intermittently (daughter and son in law work) Type of Home: House Home Access: Ramped entrance Home Layout: One level Bathroom Shower/Tub: Health Visitor: Handicapped height Bathroom Accessibility: No Additional Comments: uses U walker and RW (only uses RW in bathroom). Needed assist for ADLs at baseline  Lives With: Spouse;Family (daughter and son in law) Prior Function Level of Independence: Needs assistance with ADLs;Requires assistive device for independence Bath: Moderate Toileting: Moderate Dressing: Minimal  Able to Take Stairs?: No Driving: No Vision/Perception  Vision - Assessment Additional Comments: unable to assess due to difficulty answering questions and following one step commands  Cognition Overall Cognitive Status: Impaired/Different from baseline Arousal/Alertness: Awake/alert (but zoning out looking past therapist with no eye contact) Orientation Level: Disoriented X4 Memory: Impaired Awareness: Impaired Problem Solving: Impaired Safety/Judgment: Impaired Comments: limited due to inability to communicate with therapist Sensation Sensation Light Touch: Not tested Hot/Cold: Not tested Proprioception: Not tested Stereognosis: Not tested Additional Comments: unable to assess any of the above due to inability to follow simple one step commands or communicate with therapist. Did squeeze therapist's hand with RUE Coordination Gross Motor Movements are Fluid and Coordinated: No Fine Motor Movements are Fluid and Coordinated: No Coordination and Movement Description: poor attention, awareness, initiation, motor planning/sequencing Motor  Motor Motor: Abnormal tone;Abnormal postural alignment and control Motor - Skilled Clinical Observations: poor attention, awareness, initiation, motor planning/sequencing Trunk/Postural Assessment  Cervical Assessment Cervical Assessment: Exceptions to [facility] (forward head) Thoracic Assessment Thoracic  Assessment: Exceptions to [facility] (kyphosis) Lumbar Assessment Lumbar Assessment: Exceptions to [facility] (posterior pelvic tilt) Postural Control Postural Control: Deficits on evaluation Trunk Control: posterior and R lateral lean in sitting Righting Reactions: delayed and inadequate Protective Responses: delayed and inadequate  Balance Balance Balance Assessed: Yes Static Sitting Balance Static Sitting - Balance Support: Feet supported;Left upper extremity supported Static Sitting - Level of Assistance:  2: Max assist Dynamic Sitting Balance Dynamic Sitting - Balance Support: Feet supported;Left upper extremity supported Dynamic Sitting - Level of Assistance: 2: Max assist Static Standing Balance Static Standing - Balance Support: Bilateral upper extremity supported Static Standing - Level of Assistance: 1: +2 Total assist Static Standing - Comment/# of Minutes: standing in [place] with BUE support on bar Extremity Assessment  RLE Assessment RLE Assessment: Not tested General Strength Comments: grossly 3-/5 LLE Assessment LLE Assessment: Not tested General Strength Comments: grossly 3-/5  Care Tool Care Tool Bed Mobility Roll left and right activity   Roll left and right assist level: 2 Helpers    Sit to lying activity        Lying to sitting on side of bed activity   Lying to sitting on side of bed assist level: the ability to move from lying on the back to sitting on the side of the bed with no back support.: 2 Helpers     Care Tool Transfers Sit to stand transfer   Sit to stand assist level: 2 Helpers    Chair/bed transfer   Chair/bed transfer assist level: 2 Web Designer transfer activity did not occur: Safety/medical concerns (decresed responsiveness, weakness, decreased balance, unable to respond to simple 1 step commands)        Care Tool Locomotion Ambulation Ambulation activity did not occur: Safety/medical concerns (decresed responsiveness, weakness,  decreased balance, unable to respond to simple 1 step commands)        Walk 10 feet activity Walk 10 feet activity did not occur: Safety/medical concerns (decresed responsiveness, weakness, decreased balance, unable to respond to simple 1 step commands)       Walk 50 feet with 2 turns activity Walk 50 feet with 2 turns activity did not occur: Safety/medical concerns (decresed responsiveness, weakness, decreased balance, unable to respond to simple 1 step commands)      Walk 150 feet activity Walk 150 feet activity did not occur: Safety/medical concerns (decresed responsiveness, weakness, decreased balance, unable to respond to simple 1 step commands)      Walk 10 feet on uneven surfaces activity Walk 10 feet on uneven surfaces activity did not occur: Safety/medical concerns (decresed responsiveness, weakness, decreased balance, unable to respond to simple 1 step commands)      Stairs Stair activity did not occur: Safety/medical concerns (decresed responsiveness, weakness, decreased balance, unable to respond to simple 1 step commands)        Walk up/down 1 step activity Walk up/down 1 step or curb (drop down) activity did not occur: Safety/medical concerns (decresed responsiveness, weakness, decreased balance, unable to respond to simple 1 step commands)      Walk up/down 4 steps activity Walk up/down 4 steps activity did not occur: Safety/medical concerns (decresed responsiveness, weakness, decreased balance, unable to respond to simple 1 step commands)      Walk up/down 12 steps activity Walk up/down 12 steps activity did not occur: Safety/medical concerns (decresed responsiveness, weakness, decreased balance, unable to respond to simple 1 step commands)      Pick up small objects from floor Pick up small object from the floor (from standing position) activity did not occur: Safety/medical concerns (decresed responsiveness, weakness, decreased balance, unable to respond to simple 1  step commands)      Wheelchair Is the patient using a wheelchair?: Yes Type of Wheelchair:  (TIS WC)   Wheelchair assist level: Dependent - Patient 0% Max wheelchair distance: >321ft  Wheel  50 feet with 2 turns activity   Assist Level: Dependent - Patient 0%  Wheel 150 feet activity   Assist Level: Dependent - Patient 0%    Refer to Care Plan for Long Term Goals  SHORT TERM GOAL WEEK 1 PT Short Term Goal 1 (Week 1): pt will transfer supine<>sitting EOB with mod A of 1 PT Short Term Goal 2 (Week 1): pt will transfer bed<>chair with LRAD and max A of 1 PT Short Term Goal 3 (Week 1): pt will transfer sit<>stand with max A of 1  Recommendations for other services: None   Skilled Therapeutic Intervention Evaluation completed (see details above and below) with education on PT POC and goals and individual treatment initiated with focus on functional mobility/transfers, generalized strengthening and endurance, dynamic standing balance/coordination, and OOB tolerance. Received pt semi-reclined in bed with wife, MD, and RN at bedside. Pt remained mostly nonverbal throughout session, only stating I can't when therapist asked him to sit EOB. Pt unable to follow simple one step commands, only responding to squeezing therapist's hand and making eye contact twice. Otherwise, pt zoning out. Pt on 2L via O2 but MD cleared pt to titrate - decreased to RA and SPO2 91-100% (noted pt with tendency to hold breath, but when not holding breath, O2 increased). Pt did not appear to be in any pain. Pt transferred semi-reclined<>sitting R EOB with HOB elevated and max A +2 using helicopter method. Pt able to maintain static sitting balance with max A fading to CGA but noted R lateral lean while sitting. Scooted to EOB with max A +2 using chuck pads and required x 2 attempts and max A +2 to stand from elevated EOB in [place]. Transported to TIS WC dependently - stood from [place] flaps with mod A +2 and transitioned into  sitting. Pt transported to/from room in TIS WC dependently. Set pt up to look out window and instructed pt to place squigz on window with emphasis on reaching outside BOS, core strength, and following commands. Pt unable to pick up squigz (therapist placed in hand dependently). Pt required mod A to lean forward and hand over hand assist to place/remove squigz from window. Returned to room and concluded session with pt sitting in TIS WC, needs within reach, and seatbelt on. Safety plan updated.   Mobility Bed Mobility Bed Mobility: Rolling Right;Supine to Sit Rolling Right: 2 Helpers Supine to Sit: 2 Helpers Transfers Transfers: Stand to Sit;Transfer via Lift Equipment;Stand Pivot Transfers Stand to Sit: 2 Helpers;Dependent - mechanical lift Stand Pivot Transfers: 2 Helpers;Dependent - mechanical lift Transfer via Lift Equipment: Youth Worker Ambulation: No Gait Gait: No Stairs / Additional Locomotion Stairs: No Wheelchair Mobility Wheelchair Mobility: Yes (TIS WC) Wheelchair Assistance: Dependent - Patient 0% Wheelchair Parts Management: Needs assistance Distance: >320ft   Discharge Criteria: Patient will be discharged from PT if patient refuses treatment 3 consecutive times without medical reason, if treatment goals not met, if there is a change in medical status, if patient makes no progress towards goals or if patient is discharged from hospital.  The above assessment, treatment plan, treatment alternatives and goals were discussed and mutually agreed upon: by family  Orvan Papadakis M Zaunegger Kairi Harshbarger Zaunegger PT, DPT 12/07/2023, 12:15 PM

## 2023-12-07 NOTE — Progress Notes (Signed)
 Inpatient Rehabilitation  Patient information reviewed and entered into eRehab system by Burnard Mealing, OTR/L, Rehab Quality Coordinator.   Information including medical coding, functional ability and quality indicators will be reviewed and updated through discharge.

## 2023-12-07 NOTE — Telephone Encounter (Signed)
 Patient Product/process Development Scientist completed.    The patient is insured through [employer]. Patient has Medicare and is not eligible for a copay card, but may be able to apply for patient assistance or Medicare RX Payment Plan (Patient Must reach out to their plan, if eligible for payment plan), if available.    Ran test claim for Breo Ellipta  200 mcg and the current 30 day co-pay is $0.00.   This test claim was processed through [facility]- copay amounts may vary at other pharmacies due to pharmacy/plan contracts, or as the patient moves through the different stages of their insurance plan.     Reyes Sharps, CPHT Pharmacy Technician Patient Advocate Specialist Lead [facility] Patient Advocate Team Direct Number: [phone]  Fax: [phone]

## 2023-12-07 NOTE — IPOC Note (Addendum)
 Overall Plan of Care [facility]) Patient Details Name: Henry [name] MRN: [id number] DOB: [birth year]  Admitting Diagnosis: Parkinson's disease [facility])  Hospital Problems: Principal Problem:   Parkinson's disease (HCC)     Functional Problem List: Nursing    PT Balance, Behavior, Edema, Endurance, Motor, Nutrition, Perception, Safety, Sensory, Skin Integrity  OT Balance, Cognition, Endurance, Motor, Safety, Perception  SLP Cognition, Linguistic, Nutrition, Endurance  TR         Basic ADL's: OT Eating, Grooming, Bathing, Dressing, Toileting     Advanced  ADL's: OT None     Transfers: PT Bed Mobility, Bed to Chair, Car, Occupational Psychologist, Research Scientist (life Sciences): PT Ambulation, Psychologist, Prison And Probation Services, Stairs     Additional Impairments: OT Fuctional Use of Upper Extremity  SLP Swallowing, Communication, Social Cognition expression Problem Solving, Memory, Attention, Awareness  TR      Anticipated Outcomes Item Anticipated Outcome  Self Feeding Set-up  Swallowing  supervision   Basic self-care  Min A UB  Toileting  Min A   Bathroom Transfers Min A  Bowel/Bladder  manage bowels with medications / manage bladder with timed toileting and toileting assistance  Transfers  CGA with LRAD  Locomotion  CGA with LRAD  Communication  minA  Cognition  minA  Pain  <4w/ prns  Safety/Judgment  manage safety with supervision assistance   Therapy Plan: PT Intensity: Minimum of 1-2 x/day ,45 to 90 minutes PT Frequency: 5 out of 7 days PT Duration Estimated Length of Stay: 3-3.5 weeks OT Intensity: Minimum of 1-2 x/day, 45 to 90 minutes OT Frequency: 5 out of 7 days OT Duration/Estimated Length of Stay: 3-3.5 weeks SLP Intensity: Minumum of 1-2 x/day, 30 to 90 minutes SLP Frequency: 3 to 5 out of 7 days SLP Duration/Estimated Length of Stay: 3 weeks   Team Interventions: Nursing Interventions Patient/Family Education, Bladder Management, Bowel Management, Disease  Management/Prevention, Pain Management, Medication Management, Discharge Planning  PT interventions Ambulation/gait training, Discharge planning, Functional mobility training, Psychosocial support, Therapeutic Activities, Visual/perceptual remediation/compensation, Balance/vestibular training, Disease management/prevention, Neuromuscular re-education, Skin care/wound management, Therapeutic Exercise, Wheelchair propulsion/positioning, Cognitive remediation/compensation, DME/adaptive equipment instruction, Pain management, Splinting/orthotics, UE/LE Strength taining/ROM, Community reintegration, Development worker, international aid stimulation, Patient/family education, UE/LE Coordination activities  OT Interventions Warden/ranger, Neuromuscular re-education, Self Care/advanced ADL retraining, Therapeutic Exercise, DME/adaptive equipment instruction, UE/LE Strength taining/ROM, Firefighter, Equities Trader education, UE/LE Coordination activities, Discharge planning, Functional mobility training, Therapeutic Activities  SLP Interventions Cognitive remediation/compensation, Environmental controls, Multimodal communication approach, Speech/Language facilitation, Therapeutic Activities, Functional tasks, Cueing hierarchy, Therapeutic Exercise, Patient/family education, Dysphagia/aspiration precaution training, Oral motor exercises  TR Interventions    SW/CM Interventions     Barriers to Discharge MD  Medical stability  Nursing Decreased caregiver support, Home environment access/layout, Incontinence Discharge: House  Discharge Home Layout: One level  Discharge Home Access: Ramped entrance  PT Home environment access/layout, Decreased caregiver support, Incontinence, Weight, Other (comments) Hx of Parkinsons, impaired communication, impaired initiation, attention, awareness, decreased balanace/coordination, and impaired motor planning/sequencing  OT Incontinence    SLP Other (comments) baseline  function, severity of deficits  SW       Team Discharge Planning: Destination: PT-Home ,OT- Home , SLP-Home Projected Follow-up: PT-Home health PT, OT-  Home health OT, SLP-24 hour supervision/assistance, Outpatient SLP, Home Health SLP Projected Equipment Needs: PT-To be determined, OT- None recommended by OT, To be determined, SLP-None recommended by SLP Equipment Details: PT-has RW and U walker, OT-  Patient/family involved in discharge planning: PT- Patient,  OT-Patient,  Family member/caregiver, SLP-Family member/caregiver  MD ELOS: 14-16 days Medical Rehab Prognosis:  Excellent Assessment: The patient has been admitted for CIR therapies with the diagnosis of Parkinson's disease. The team will be addressing functional mobility, strength, stamina, balance, safety, adaptive techniques and equipment, self-care, bowel and bladder mgt, patient and caregiver education. Goals have been set at S to [facility]. Anticipated discharge destination is home.        See Team Conference Notes for weekly updates to the plan of care

## 2023-12-07 NOTE — Progress Notes (Signed)
 EEG complete - results pending

## 2023-12-07 NOTE — Progress Notes (Signed)
 Discussed patient with  neurology who reviewed EEG which showed generalized slowing question encephalopathy.  No new changes recommended.

## 2023-12-07 NOTE — Progress Notes (Signed)
 Patient ID: Henry [name], male   DOB: Aug 02, 1950, 73 y.o.   MRN: [id number]  Santina to complete initial assessment for patient - he was in a deep sleep and would not wake up at the moment. Will try again at a later time

## 2023-12-07 NOTE — Procedures (Signed)
 Patient Name: Henry [name]  MRN: [id number]  Epilepsy Attending: Pastor Falling  Referring Physician/Provider: No ref. provider found      Date: 12/07/2023 Duration: 28 minutes   Patient history: 73 year old man with generalized weakness and altered mental status. EEG to evaluate for seizure.   Level of alertness: Lethargic  AEDs during EEG study: None   Technical aspects: This EEG study was done with scalp electrodes positioned according to the 10-20 International system of electrode placement. Electrical activity was reviewed with band pass filter of 1-70Hz , sensitivity of 7 uV/mm, display speed of 76mm/sec with a 60Hz  notched filter applied as appropriate. EEG data were recorded continuously and digitally stored.  Video monitoring was available and reviewed as appropriate.  Description: EEG showed continuous generalized 3 to 6 Hz theta-delta slowing. Hyperventilation and photic stimulation were not performed.     ABNORMALITY - Continuous slow, generalized   IMPRESSION: This study is suggestive of moderate diffuse brain dysfunction such as encephalopathy, nonspecific etiology but likely related to sedation, toxic-metabolic etiology. No seizures or epileptiform discharges were seen throughout the recording.   Pastor Falling MD Neurology

## 2023-12-07 NOTE — Progress Notes (Signed)
 PROGRESS NOTE   Subjective/Complaints: Patient continues to have dysarthria, as per wife appears to be worse, patient states he feels horrible, when asked to participate in MMT he states he cannot lift arms or legs  ROS: +right sided deafness, +generalized weakness  Objective:   EEG adult Result Date: 12/07/2023 Gregg Lek, MD     12/07/2023  8:48 AM Patient Name: Henry [name] MRN: [id number] Epilepsy Attending: Lek Gregg Referring Physician/Provider: No ref. provider found     Date: 12/07/2023 Duration: 28 minutes Patient history: 73 year old man with generalized weakness and altered mental status. EEG to evaluate for seizure. Level of alertness: Lethargic AEDs during EEG study: None Technical aspects: This EEG study was done with scalp electrodes positioned according to the 10-20 International system of electrode placement. Electrical activity was reviewed with band pass filter of 1-70Hz , sensitivity of 7 uV/mm, display speed of 66mm/sec with a 60Hz  notched filter applied as appropriate. EEG data were recorded continuously and digitally stored.  Video monitoring was available and reviewed as appropriate. Description: EEG showed continuous generalized 3 to 6 Hz theta-delta slowing. Hyperventilation and photic stimulation were not performed.   ABNORMALITY - Continuous slow, generalized IMPRESSION: This study is suggestive of moderate diffuse brain dysfunction such as encephalopathy, nonspecific etiology but likely related to sedation, toxic-metabolic etiology. No seizures or epileptiform discharges were seen throughout the recording. Lek Gregg MD Neurology    DG Chest 2 View Result Date: 12/06/2023 EXAM: 2 VIEW(S) XRAY OF THE CHEST 12/06/2023 08:46:34 PM COMPARISON: 12/03/2023 CLINICAL HISTORY: 200808 Hypoxia [id number] FINDINGS: LINES, TUBES AND DEVICES: Left AICD is unchanged. LUNGS AND PLEURA: Mild vascular congestion. Mild  elevation of the right hemidiaphragm with right base atelectasis. No focal pulmonary opacity. No pleural effusion. No pneumothorax. HEART AND MEDIASTINUM: Prior CABG. Mild cardiomegaly. BONES AND SOFT TISSUES: No acute osseous abnormality. IMPRESSION: 1. Mild vascular congestion with right basilar atelectasis and mild elevation of the right hemidiaphragm. 2. Mild cardiomegaly. Electronically signed by: Franky Crease MD 12/06/2023 09:00 PM EST RP Workstation: [id number]   Recent Labs    12/06/23 0433  WBC 5.6  HGB 13.2  HCT 39.8  PLT 121*   Recent Labs    12/05/23 0132 12/06/23 0433  NA 138 136  K 4.0 3.7  CL 94* 94*  CO2 35* 32  GLUCOSE 114* 116*  BUN 19 20  CREATININE 1.27* 1.15  CALCIUM  8.5* 8.5*    Intake/Output Summary (Last 24 hours) at 12/07/2023 0933 Last data filed at 12/06/2023 1852 Gross per 24 hour  Intake 238 ml  Output --  Net 238 ml        Physical Exam: Vital Signs Blood pressure 107/62, pulse 64, temperature 97.8 F (36.6 C), resp. rate 19, height 5' 9 (1.753 m), weight 116.5 kg, SpO2 98%. Gen: no distress, normal appearing HEENT: oral mucosa pink and moist, NCAT Cardio: Reg rate Chest: normal effort, normal rate of breathing Abd: soft, non-distended Extremities: No clubbing, cyanosis. Trace LE edema Psych: Pt's affect is flat.  Cooperative Skin: Bruising noted on bilateral upper extremities Neuro:     Mental Status: AAOx4, memory deficits present, intermittant confusion, delayed responses, able to provide his date  of birth Speech/Languate: Naming and repetition intact, + mod to severe dysarthria- does appear worse this morning, follows simple commands CRANIAL NERVES: II: PERRL. Visual fields full III, IV, VI: EOM intact, no gaze preference or deviation V: normal sensation bilaterally VII: no asymmetry VIII: normal hearing to speech IX, X: normal palatal elevation XI: head turn intact b/l XII: Tongue midline     MOTOR: Patient states he is  unable to lift arms or legs   SENSORY: Normal to touch all 4 extremities   Increase tone/rigidity in b/l legs Right greater than left Slightly increased tone in LUE>RUE   + mild tremor RUE   Assessment/Plan: 1. Functional deficits which require 3+ hours per day of interdisciplinary therapy in a comprehensive inpatient rehab setting. Physiatrist is providing close team supervision and 24 hour management of active medical problems listed below. Physiatrist and rehab team continue to assess barriers to discharge/monitor patient progress toward functional and medical goals  Care Tool:  Bathing              Bathing assist       Upper Body Dressing/Undressing Upper body dressing        Upper body assist      Lower Body Dressing/Undressing Lower body dressing            Lower body assist       Toileting Toileting    Toileting assist       Transfers Chair/bed transfer  Transfers assist           Locomotion Ambulation   Ambulation assist              Walk 10 feet activity   Assist           Walk 50 feet activity   Assist           Walk 150 feet activity   Assist           Walk 10 feet on uneven surface  activity   Assist           Wheelchair     Assist               Wheelchair 50 feet with 2 turns activity    Assist            Wheelchair 150 feet activity     Assist          Blood pressure 107/62, pulse 64, temperature 97.8 F (36.6 C), resp. rate 19, height 5' 9 (1.753 m), weight 116.5 kg, SpO2 98%.  Medical Problem List and Plan: 1. Functional deficits secondary to generalized weakness due to parkinson disease. Pt has DBS.              -patient may shower             -ELOS/Goals: 14-16, PT sup to CGA, OT mon/mod A, SLP sup to min A             Neurology consulted for worsening dysarthria/strength, stat CT head ordered  2.  Antithrombotics: -DVT/anticoagulation:   Pharmaceutical: continue Lovenox              -antiplatelet therapy: ASA  3. Episodes of blank stares: EEG ordered and is consistent with encephalopathy- no seizures evident  4. Mood/Behavior/Sleep: LCSW to follow for evaluation and support.              -antipsychotic agents: N/A  5. Neuropsych/cognition: This patient may be intermittent  capable of  making decisions on his own behalf.  6. Skin/Wound Care: Routine pressure relief measures  7. Class 2 obesity: provided wife with a list of foods for weight loss  8. PD s/p DBS/Autonomic dysfunction: Monitor for orthostatic symptoms with increase in activity             -continue sinemet  ER five times a day.  --Pimavanserin  daily to  manage hallucinations  Continue metanx  9. T2DM: Monitor BS ac/hs, d/c ISS to help get patient on home regimen, provided a list of foods that are good for diabetes             --On Lantus  15 units BId with novolog  4 units TID ac. Start metformin  500mg  daily with breakfast   10. HFrEF: Strict I/O. Daily weights and monitor for signs of overload.              --Low salt diet. Continue ASA, Lasix , Cozaar , Toprol  XL and aldactone . Asked nursing to please weigh him today, lasix  10mg  additional ordered 11/28 for slight increase in weight  11. Glaucoma: Managed with cosopt   12. Hyperactive airway disease: On Breo.   13. Thrush: Will add nystatin  mouth wash 14. AKI: SCr on rise to 1.27-->recheck in am.             --Encourage fluid intake. Orthostatic vitals.  15.  Thrombocytopenia: Monitor for signs of bleeding. Recheck CBC in am.    16. Urinary retention: d/c mirabegron . Will monitor voiding with PVR checks.   17. Constipation: LBM 11/25, continue miralax  BID, asked nursing to please offer prune juice, magnesium  oxide 200mg  ordered daily  18. Concern for seizures: monitor for signs/symptoms, EEG ordered  19. Spasticity/dystonia: patient follows with Dr. Carilyn for botox  outpatient  20. Mild vascular  congestion with mild atelectasis on CXR: O2 ordered with goal sat above 96%, discussed with wife that patient has no use of incentive spirometer. Discussed with patient/wife/therapy weaning O2 with therapy, additional lasix  10 given 11/28 for mild increase in weight, incentive spirometer ordered  LOS: 2 days A FACE TO FACE EVALUATION WAS PERFORMED  Sven SQUIBB Thaily Hackworth 12/07/2023, 9:33 AM

## 2023-12-07 NOTE — Discharge Instructions (Addendum)
 Inpatient Rehab Discharge Instructions  Henry [name] Discharge date and time:  12/24/23  Activities/Precautions/ Functional Status: Activity: no lifting, driving, or strenuous exercise till cleared by MD Diet: Carb Modified Wound Care:    Functional status:  ___ No restrictions     ___ Walk up steps independently _X__ 24/7 supervision/assistance   ___ Walk up steps with assistance ___ Intermittent supervision/assistance  ___ Bathe/dress independently ___ Walk with walker     _X__ Bathe/dress with assistance ___ Walk Independently    ___ Shower independently ___ Walk with assistance    ___ Shower with assistance _X__ No alcohol     ___ Return to work/school ________   Special Instructions: Resume Omnipod and dexcom when you get home 2.  Use flutter valve 5-10 times at least 4 times a day  3.  Keep head of bed elevated at least 30 degrees when sleeping (may help his sleep apnea)    COMMUNITY REFERRALS UPON DISCHARGE:    [facility]   My questions have been answered and I understand these instructions. I will adhere to these goals and the provided educational materials after my discharge from the hospital.  Patient/Caregiver Signature _______________________________ Date __________  Clinician Signature _______________________________________ Date __________  Please bring this form and your medication list with you to all your follow-up doctor's appointments.

## 2023-12-07 NOTE — Evaluation (Signed)
 Speech Language Pathology Assessment and Plan  Patient Details  Name: Henry [name] MRN: [id number] Date of Birth: 1950-01-11  SLP Diagnosis: Dysarthria;Cognitive Impairments;Speech and Language deficits;Dysphagia  Rehab Potential: Fair ELOS: 3 weeks    Today's Date: 12/07/2023 SLP Individual Time: 8699-8648 SLP Individual Time Calculation (min): 51 min and Today's Date: 12/07/2023 SLP Missed Time: 9 Minutes Missed Time Reason: Patient fatigue   Hospital Problem: Principal Problem:   Parkinson's disease [facility])  Past Medical History:  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)   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   Dysrhythmia    a-fib   GERD (gastroesophageal reflux disease)    Glaucoma    right eye   History of kidney stones    multiple   HLD (hyperlipidemia)    HTN (hypertension)    pt denies 08/19/12   Hyperplasia, prostate    Hypothyroidism    MI (myocardial infarction) (HCC)    Dr Pietro 2000, x3vessels bypass   Neuromuscular disorder (HCC)    Parkinson's Disease   OSA (obstructive sleep apnea)    AHI-28,on CPAP, noncompliant with CPAP   Parkinson disease (HCC)    1999   PONV (postoperative nausea and vomiting)    Restless legs    Shortness of breath    Hx: of at all times   UTI (lower urinary tract infection) 09/15/2012   Klebsiella   Ventral hernia    Walker as ambulation aid    also uses wheelchair at home/when going out   Past Surgical History:  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   BIOPSY  10/14/2018    Procedure: BIOPSY;  Surgeon: Albertus Gordy HERO, MD;  Location: [facility];  Service: Gastroenterology;;   CATARACT EXTRACTION W/ INTRAOCULAR LENS IMPLANT     Hx: of right eye   CATARACT EXTRACTION W/ INTRAOCULAR LENS IMPLANT Left 2018   COLONOSCOPY N/A 10/13/2014   Procedure: COLONOSCOPY;  Surgeon: Gordy HERO Albertus, MD;  Location: [facility];  Service: Gastroenterology;  Laterality: N/A;   COLONOSCOPY W/ BIOPSIES AND POLYPECTOMY     Hx: of   COLONOSCOPY WITH PROPOFOL  N/A 10/14/2018   Procedure: COLONOSCOPY WITH PROPOFOL ;  Surgeon: Albertus Gordy HERO, MD;  Location: [facility];  Service: Gastroenterology;  Laterality: N/A;   CORONARY ARTERY BYPASS GRAFT  2000   Sudie Laine, MD   CORONARY STENT PLACEMENT  1998   DEEP BRAIN STIMULATOR PLACEMENT  2004   Right and left VIN stimulator placement (parkinsons)   EYE SURGERY     Cataract removed in 1980s related to wood in eye when 73 years old   FINGER AMPUTATION     left pointer   IMPLANTABLE CARDIOVERTER DEFIBRILLATOR IMPLANT N/A 11/13/2013   Procedure: IMPLANTABLE CARDIOVERTER DEFIBRILLATOR IMPLANT;  Surgeon: Danelle LELON Waddell, MD;  Location: [facility] CATH LAB;  Service: Cardiovascular;  Laterality: N/A;   INSERT / REPLACE / REMOVE PACEMAKER     medtronic   LEFT  AND RIGHT HEART CATHETERIZATION WITH CORONARY ANGIOGRAM N/A 09/24/2013   Procedure: LEFT AND RIGHT HEART CATHETERIZATION WITH CORONARY ANGIOGRAM;  Surgeon: Lonni JONETTA Cash, MD;  Location: [facility] CATH LAB;  Service: Cardiovascular;  Laterality: N/A;   LITHOTRIPSY     3 different times   POLYPECTOMY  10/14/2018   Procedure: POLYPECTOMY;  Surgeon: Albertus Gordy HERO, MD;  Location: [facility];  Service: Gastroenterology;;   PULSE GENERATOR IMPLANT Right 11/13/2017   Procedure: Right chest implantable pulse generator change;  Surgeon: Unice Pac, MD;  Location: [facility] OR;  Service: Neurosurgery;  Laterality: Right;  Right chest implantable pulse generator change   SUBTHALAMIC STIMULATOR BATTERY  REPLACEMENT N/A 09/05/2012   Procedure: Deep brain stimulator battery change;  Surgeon: Pac Unice, MD;  Location: [facility];  Service: Neurosurgery;  Laterality: N/A;  Deep brain stimulator battery change   SUBTHALAMIC STIMULATOR BATTERY REPLACEMENT N/A 06/10/2015   Procedure: Deep Brain stimulator battery change;  Surgeon: Pac Unice, MD;  Location: [facility];  Service: Neurosurgery;  Laterality: N/A;   SUBTHALAMIC STIMULATOR BATTERY REPLACEMENT Right 09/25/2019   Procedure: Deep brain stimulator battery change;  Surgeon: Unice Pac, MD;  Location: [facility] OR;  Service: Neurosurgery;  Laterality: Right;   SUBTHALAMIC STIMULATOR BATTERY REPLACEMENT Right 12/15/2022   Procedure: IMPLANTABLE PULSE GENERATOR  BATTERY REPLACEMENT;  Surgeon: Carollee Lani BROCKS, DO;  Location: [facility] OR;  Service: Neurosurgery;  Laterality: Right;  3C   TONSILLECTOMY      Assessment / Plan / Recommendation Clinical Impression Pt is a 73 year old male with history of PD s/p DBS, autonomic dysfunction,  T2DM, Chronic systolic and diastolic CHF, ICM w/chronic systolic/diastolic HF, PAF- not on AC due to falls, OSA, HOH, MI, recent episodes of staring with poor responsiveness and DBS adjusted. He  was admitted on 11/27/23 with reports of staring BLE edema, episodes, with decrease in LOC and leaning to the right. left sided weakness and difficulty walking. BLE dopplers negative for DVT. CT head repeated and negative for acute changes. LT-EEG ordered and showed mild generalized dysfunction without epileptiform discharge or seizures. Cardiology consulted on 11/24 for input and recommended continuing oral lasix  and BB resumed with recommendations to monitor BP closely due to history of autonomic dysfunction. BLE edema has resolved. He has had issues fatigue and weakness. Wife reports hallucinations with agitation. CIR recommended due to functional decline.   Pt presented w/ severe to profound dysarthria characterized by low vocal  intensity, limited articulatory precision, and monotonous speech. Speech judged to be largely unintelligible w/o context. His wife reported baseline dysarthria given PD, however, notable decline in intelligibility this admission. Of note, fatigue significantly impacted pt performance throughout eval. He also appeared to present w/ severe STM, sustained attention, and problem solving deficits during portions of the [employer] Mental Status (SLUMS) Exam. Expressive/receptive language appear WFL at this time. Attempted bedside swallow eval, however, unable to complete d/t pt's somnolent state. He was so fatigued that he couldn't suck the liquid through the straw during liquid trials. Wife reported no concern re dysphagia and his nurse reported no overt s/s of airway invasion w/ noon meal. Recommend cont regular textures and thin liquids at this time. Will dynamically assess in upcoming tx sessions.   He would benefit from skilled ST services to target motor speech and cognitive deficits, maximize pt communication and subsequent independence, and reduce caregiver burden.      Skilled Therapeutic Interventions          SLP facilitated a  cognitive-linguistic evaluation to assess pt's cognitive-communication skills and determine need for additional skilled ST services. See above for more information.    SLP Assessment  Patient will need skilled Speech Lanaguage Pathology Services during CIR admission    Recommendations  SLP Diet Recommendations: Age appropriate regular solids;Thin Liquid Administration via: Straw Medication Administration: Whole meds with puree Supervision: Staff to assist with self feeding;Full supervision/cueing for compensatory strategies Compensations: Slow rate;Small sips/bites Postural Changes and/or Swallow Maneuvers: Seated upright 90 degrees;Upright 30-60 min after meal Oral Care Recommendations: Oral care BID Recommendations for Other Services: Therapeutic Recreation  consult Therapeutic Recreation Interventions: Pet therapy Patient destination: Home Follow up Recommendations: 24 hour supervision/assistance;Outpatient SLP;Home Health SLP Equipment Recommended: None recommended by SLP    SLP Frequency 3 to 5 out of 7 days   SLP Duration  SLP Intensity  SLP Treatment/Interventions 3 weeks  Minumum of 1-2 x/day, 30 to 90 minutes  Cognitive remediation/compensation;Environmental controls;Multimodal communication approach;Speech/Language facilitation;Therapeutic Activities;Functional tasks;Cueing hierarchy;Therapeutic Exercise;Patient/family education;Dysphagia/aspiration precaution training;Oral motor exercises    Pain Pain Assessment Pain Scale: 0-10 Pain Score: 0-No pain  Prior Functioning Type of Home: House  Lives With: Spouse;Family (daughter and son in law) Available Help at Discharge: Family;Available 24 hours/day;Available PRN/intermittently (daughter and son in law work)  SLP Evaluation Cognition Overall Cognitive Status: Impaired/Different from baseline Arousal/Alertness: Lethargic Orientation Level: Oriented to person;Oriented to place Year: 2025 Month: November Day of Week: Incorrect Attention: Sustained Sustained Attention: Impaired Sustained Attention Impairment: Verbal basic;Functional basic Memory: Impaired Memory Impairment: Retrieval deficit;Decreased recall of new information;Decreased short term memory Decreased Short Term Memory: Verbal basic;Functional basic Awareness: Impaired Awareness Impairment: Intellectual impairment Problem Solving: Impaired Problem Solving Impairment: Verbal basic;Functional basic Safety/Judgment: Other (comment)  Comprehension Auditory Comprehension Overall Auditory Comprehension: Appears within functional limits for tasks assessed Interfering Components: Hearing Expression Expression Primary Mode of Expression: Verbal Verbal Expression Overall Verbal Expression:  Impaired Initiation: Impaired Naming: Impairment Divergent: 50-74% accurate Interfering Components: Attention Non-Verbal Means of Communication: Not applicable Written Expression Dominant Hand: Right Written Expression: Not tested Oral Motor Oral Motor/Sensory Function Overall Oral Motor/Sensory Function: Severe impairment Facial ROM: Reduced left;Reduced right Facial Strength: Reduced right;Reduced left Lingual ROM: Reduced left;Reduced right Lingual Strength: Reduced Motor Speech Overall Motor Speech: Impaired Respiration: Impaired Level of Impairment: Word Phonation: Low vocal intensity Articulation: Impaired Level of Impairment: Word Intelligibility: Intelligibility reduced Word: 25-49% accurate Phrase: 0-24% accurate Sentence: 0-24% accurate Conversation: 0-24% accurate Motor Speech Errors: Unaware Interfering Components: Premorbid status;Hearing loss Effective Techniques: Over-articulate;Increased vocal intensity;Slow rate  Care Tool Care Tool Cognition Ability to hear (with hearing aid or hearing appliances if normally used Ability to hear (with hearing aid or hearing appliances if normally used): 2. Moderate difficulty - speaker has to increase volume and speak distinctly   Expression of Ideas and Wants Expression of Ideas and Wants: 2. Frequent difficulty - frequently exhibits difficulty with expressing needs and ideas   Understanding Verbal and Non-Verbal Content Understanding Verbal and Non-Verbal Content: 2. Sometimes understands - understands only basic conversations or simple, direct phrases. Frequently requires cues to understand  Memory/Recall Ability Memory/Recall Ability : That he or she is in a hospital/hospital unit;Current season   Motor Speech Assessment  Intelligibility: Intelligibility reduced Word: 25-49% accurate Phrase: 0-24% accurate Sentence: 0-24% accurate Conversation: 0-24% accurate   Short Term Goals: Week 1: SLP Short Term Goal 1  (Week 1): Pt will utilize compensatory speech strategies to maintain 80% intelligibility or greater at word level given modA SLP Short Term Goal 2 (Week 1):  Pt will utilize compensatory speech strategies to maintain 60% intelligibility or greater at sentence level given modA SLP Short Term Goal 3 (Week 1): Pt will solve simple problems w/ modA SLP Short Term Goal 4 (Week 1): Pt will sustain attention for ~10 mins w/ modA SLP Short Term Goal 5 (Week 1): Pt will recall recent/relevant info w/ modA SLP Short Term Goal 6 (Week 1): Pt will tolerate regular textures w/ thin liquids w/ no overt s/s of airway invasion 90% of the time or greater  Refer to Care Plan for Long Term Goals  Recommendations for other services: Therapeutic Recreation  Pet therapy  Discharge Criteria: Patient will be discharged from SLP if patient refuses treatment 3 consecutive times without medical reason, if treatment goals not met, if there is a change in medical status, if patient makes no progress towards goals or if patient is discharged from hospital.  The above assessment, treatment plan, treatment alternatives and goals were discussed and mutually agreed upon: by family  [name] 12/07/2023, 3:56 PM

## 2023-12-07 NOTE — Plan of Care (Signed)
  Problem: RH Swallowing Goal: LTG Patient will consume least restrictive diet using compensatory strategies with assistance (SLP) Description: LTG:  Patient will consume least restrictive diet using compensatory strategies with assistance (SLP) Flowsheets (Taken 12/07/2023 1613) LTG: Pt Patient will consume least restrictive diet using compensatory strategies with assistance of (SLP): Supervision   Problem: RH Expression Communication Goal: LTG Patient will express needs/wants via multi-modal(SLP) Description: LTG:  Patient will express needs/wants via multi-modal communication (gestures/written, etc) with cues (SLP) Flowsheets (Taken 12/07/2023 1613) LTG: Patient will express needs/wants via multimodal communication (gestures/written, etc) with cueing (SLP): Minimal Assistance - Patient > 75% Goal: LTG Patient will increase speech intelligibility (SLP) Description: LTG: Patient will increase speech intelligibility at word/phrase/conversation level with cues, % of the time (SLP) Flowsheets (Taken 12/07/2023 1613) LTG: Patient will increase speech intelligibility (SLP): Minimal Assistance - Patient > 75% Level: (sentence) Other (Comment) Percent of time patient will use intelligible speech: 85   Problem: RH Problem Solving Goal: LTG Patient will demonstrate problem solving for (SLP) Description: LTG:  Patient will demonstrate problem solving for basic/complex daily situations with cues  (SLP) Flowsheets (Taken 12/07/2023 1613) LTG: Patient will demonstrate problem solving for (SLP): Basic daily situations LTG Patient will demonstrate problem solving for: Minimal Assistance - Patient > 75%   Problem: RH Memory Goal: LTG Patient will use memory compensatory aids to (SLP) Description: LTG:  Patient will use memory compensatory aids to recall biographical/new, daily complex information with cues (SLP) Flowsheets (Taken 12/07/2023 1613) LTG: Patient will use memory compensatory aids to (SLP):  Minimal Assistance - Patient > 75% Note: Recall recent/relevant info   Problem: RH Attention Goal: LTG Patient will demonstrate this level of attention during functional activites (SLP) Description: LTG:  Patient will will demonstrate this level of attention during functional activites (SLP) Flowsheets (Taken 12/07/2023 1615) Patient will demonstrate during cognitive/linguistic activities the attention type of: Sustained Patient will demonstrate this level of attention during cognitive/linguistic activities in: Controlled LTG: Patient will demonstrate this level of attention during cognitive/linguistic activities with assistance of (SLP): Minimal Assistance - Patient > 75% Number of minutes patient will demonstrate attention during cognitive/linguistic activities: 20    Problem: RH Awareness Goal: LTG: Patient will demonstrate awareness during functional activites type of (SLP) Description: LTG: Patient will demonstrate awareness during functional activites type of (SLP) Outcome: Not Applicable (selected in error)

## 2023-12-08 DIAGNOSIS — E119 Type 2 diabetes mellitus without complications: Secondary | ICD-10-CM | POA: Diagnosis not present

## 2023-12-08 DIAGNOSIS — R471 Dysarthria and anarthria: Secondary | ICD-10-CM | POA: Diagnosis not present

## 2023-12-08 DIAGNOSIS — G20B1 Parkinson's disease with dyskinesia, without mention of fluctuations: Secondary | ICD-10-CM | POA: Diagnosis not present

## 2023-12-08 DIAGNOSIS — Z794 Long term (current) use of insulin: Secondary | ICD-10-CM | POA: Diagnosis not present

## 2023-12-08 LAB — CBC
HCT: 42.7 % (ref 39.0–52.0)
Hemoglobin: 13.7 g/dL (ref 13.0–17.0)
MCH: 29.1 pg (ref 26.0–34.0)
MCHC: 32.1 g/dL (ref 30.0–36.0)
MCV: 90.7 fL (ref 80.0–100.0)
Platelets: 123 K/uL — ABNORMAL LOW (ref 150–400)
RBC: 4.71 MIL/uL (ref 4.22–5.81)
RDW: 13.8 % (ref 11.5–15.5)
WBC: 5.6 K/uL (ref 4.0–10.5)
nRBC: 0 % (ref 0.0–0.2)

## 2023-12-08 LAB — COMPREHENSIVE METABOLIC PANEL WITH GFR
ALT: <5 U/L (ref 0–44)
AST: <10 U/L — ABNORMAL LOW (ref 15–41)
Albumin: 3.3 g/dL — ABNORMAL LOW (ref 3.5–5.0)
Alkaline Phosphatase: 56 U/L (ref 38–126)
Anion gap: 9 (ref 5–15)
BUN: 20 mg/dL (ref 8–23)
CO2: 37 mmol/L — ABNORMAL HIGH (ref 22–32)
Calcium: 8.5 mg/dL — ABNORMAL LOW (ref 8.9–10.3)
Chloride: 94 mmol/L — ABNORMAL LOW (ref 98–111)
Creatinine, Ser: 1.24 mg/dL (ref 0.61–1.24)
GFR, Estimated: >60 mL/min (ref >60)
Glucose, Bld: 130 mg/dL — ABNORMAL HIGH (ref 70–99)
Potassium: 4.3 mmol/L (ref 3.5–5.1)
Sodium: 140 mmol/L (ref 135–145)
Total Bilirubin: 0.7 mg/dL (ref 0.0–1.2)
Total Protein: 5.7 g/dL — ABNORMAL LOW (ref 6.5–8.1)

## 2023-12-08 LAB — GLUCOSE, CAPILLARY
Glucose-Capillary: 121 mg/dL — ABNORMAL HIGH (ref 70–99)
Glucose-Capillary: 138 mg/dL — ABNORMAL HIGH (ref 70–99)
Glucose-Capillary: 157 mg/dL — ABNORMAL HIGH (ref 70–99)
Glucose-Capillary: 134 mg/dL — ABNORMAL HIGH (ref 70–99)

## 2023-12-08 NOTE — Progress Notes (Signed)
 PROGRESS NOTE   Subjective/Complaints: Pt with unremarkable night. Was able to sleep. Still with a lot of dysarthria and fatigue. Discussions noted from yesterday between primary team and neurology  ROS: Limited due to communication  Objective:   CT HEAD WO CONTRAST ( ) Result Date: 12/07/2023 EXAM: CT HEAD WITHOUT CONTRAST 12/07/2023 11:30:30 AM TECHNIQUE: CT of the head was performed without the administration of intravenous contrast. Automated exposure control, iterative reconstruction, and/or weight based adjustment of the mA/kV was utilized to reduce the radiation dose to as low as reasonably achievable. COMPARISON: CT of the head dated 11/29/2023. CLINICAL HISTORY: Neuro deficit, acute, stroke suspected. FINDINGS: BRAIN AND VENTRICLES: No acute hemorrhage. No evidence of acute infarct. No hydrocephalus. No extra-axial collection. No mass effect or midline shift. Age-related cerebral volume loss present. Deep brain stimulators again demonstrated. ORBITS: No acute abnormality. SINUSES: No acute abnormality. SOFT TISSUES AND SKULL: No acute soft tissue abnormality. No skull fracture. Status post right mastoidectomy. IMPRESSION: 1. No acute intracranial abnormality. 2. Age-related cerebral volume loss. 3. Deep brain stimulators again demonstrated. 4. Status post right mastoidectomy. Electronically signed by: Evalene Coho MD 12/07/2023 11:50 AM EST RP Workstation: [id number]   EEG adult Result Date: 12/07/2023 Gregg Lek, MD     12/07/2023  8:48 AM Patient Name: Henry [name] MRN: [id number] Epilepsy Attending: Lek Gregg Referring Physician/Provider: No ref. provider found     Date: 12/07/2023 Duration: 28 minutes Patient history: 73 year old man with generalized weakness and altered mental status. EEG to evaluate for seizure. Level of alertness: Lethargic AEDs during EEG study: None Technical aspects: This EEG study was done  with scalp electrodes positioned according to the 10-20 International system of electrode placement. Electrical activity was reviewed with band pass filter of 1-70Hz , sensitivity of 7 uV/mm, display speed of 48mm/sec with a 60Hz  notched filter applied as appropriate. EEG data were recorded continuously and digitally stored.  Video monitoring was available and reviewed as appropriate. Description: EEG showed continuous generalized 3 to 6 Hz theta-delta slowing. Hyperventilation and photic stimulation were not performed.   ABNORMALITY - Continuous slow, generalized IMPRESSION: This study is suggestive of moderate diffuse brain dysfunction such as encephalopathy, nonspecific etiology but likely related to sedation, toxic-metabolic etiology. No seizures or epileptiform discharges were seen throughout the recording. Lek Gregg MD Neurology    DG Chest 2 View Result Date: 12/06/2023 EXAM: 2 VIEW(S) XRAY OF THE CHEST 12/06/2023 08:46:34 PM COMPARISON: 12/03/2023 CLINICAL HISTORY: 200808 Hypoxia [id number] FINDINGS: LINES, TUBES AND DEVICES: Left AICD is unchanged. LUNGS AND PLEURA: Mild vascular congestion. Mild elevation of the right hemidiaphragm with right base atelectasis. No focal pulmonary opacity. No pleural effusion. No pneumothorax. HEART AND MEDIASTINUM: Prior CABG. Mild cardiomegaly. BONES AND SOFT TISSUES: No acute osseous abnormality. IMPRESSION: 1. Mild vascular congestion with right basilar atelectasis and mild elevation of the right hemidiaphragm. 2. Mild cardiomegaly. Electronically signed by: Franky Crease MD 12/06/2023 09:00 PM EST RP Workstation: [id number]   Recent Labs    12/06/23 0433  WBC 5.6  HGB 13.2  HCT 39.8  PLT 121*   Recent Labs    12/06/23 0433  NA 136  K 3.7  CL 94*  CO2  32  GLUCOSE 116*  BUN 20  CREATININE 1.15  CALCIUM  8.5*    Intake/Output Summary (Last 24 hours) at 12/08/2023 0807 Last data filed at 12/07/2023 1900 Gross per 24 hour  Intake 286 ml  Output --   Net 286 ml        Physical Exam: Vital Signs Blood pressure 122/89, pulse 71, temperature (!) 97.5 F (36.4 C), temperature source Oral, resp. rate 18, height 5' 9 (1.753 m), weight 116.5 kg, SpO2 99%. Constitutional: No distress . Vital signs reviewed. HEENT: NCAT, EOMI, oral membranes moist Neck: supple Cardiovascular: RRR without murmur. No JVD    Respiratory/Chest: CTA Bilaterally without wheezes or rales. Normal effort    GI/Abdomen: BS +, non-tender, non-distended Ext: no clubbing, cyanosis, or edema Psych: pleasant and cooperative Skin: Bruising noted on bilateral upper extremities Neuro:     Mental Status: AAOx4, memory deficits present, intermittant confusion, delayed responses, able to provide his date of birth Speech/Languate: Naming and repetition intact,   severe dysarthria this morning, follows simple commands. Pill rolling tremor bilaterally CRANIAL NERVES: II: PERRL. Visual fields full III, IV, VI: EOM intact, no gaze preference or deviation V: normal sensation bilaterally VII: no asymmetry VIII: normal hearing to speech IX, X: normal palatal elevation XI: head turn intact b/l XII: Tongue midline     MOTOR: Patient with difficulty lifting arms or legs   SENSORY: Normal to touch all 4 extremities   persistent tone/rigidity in b/l legs Right greater than left Slightly increased tone in LUE>RUE, tremor       Assessment/Plan: 1. Functional deficits which require 3+ hours per day of interdisciplinary therapy in a comprehensive inpatient rehab setting. Physiatrist is providing close team supervision and 24 hour management of active medical problems listed below. Physiatrist and rehab team continue to assess barriers to discharge/monitor patient progress toward functional and medical goals  Care Tool:  Bathing    Body parts bathed by patient: Face, Right arm, Left arm, Chest   Body parts bathed by helper: Abdomen, Front perineal area, Buttocks, Right  upper leg, Left upper leg, Right lower leg, Left lower leg     Bathing assist Assist Level: Maximal Assistance - Patient 24 - 49%     Upper Body Dressing/Undressing Upper body dressing   What is the patient wearing?: Pull over shirt    Upper body assist Assist Level: Maximal Assistance - Patient 25 - 49%    Lower Body Dressing/Undressing Lower body dressing      What is the patient wearing?: Pants, Incontinence brief     Lower body assist Assist for lower body dressing: Dependent - Patient 0%     Toileting Toileting    Toileting assist Assist for toileting: Dependent - Patient 0%     Transfers Chair/bed transfer  Transfers assist     Chair/bed transfer assist level: Dependent - mechanical lift     Locomotion Ambulation   Ambulation assist   Ambulation activity did not occur: Safety/medical concerns (decresed responsiveness, weakness, decreased balance, unable to respond to simple 1 step commands)          Walk 10 feet activity   Assist  Walk 10 feet activity did not occur: Safety/medical concerns (decresed responsiveness, weakness, decreased balance, unable to respond to simple 1 step commands)        Walk 50 feet activity   Assist Walk 50 feet with 2 turns activity did not occur: Safety/medical concerns (decresed responsiveness, weakness, decreased balance, unable to respond to simple 1  step commands)         Walk 150 feet activity   Assist Walk 150 feet activity did not occur: Safety/medical concerns (decresed responsiveness, weakness, decreased balance, unable to respond to simple 1 step commands)         Walk 10 feet on uneven surface  activity   Assist Walk 10 feet on uneven surfaces activity did not occur: Safety/medical concerns (decresed responsiveness, weakness, decreased balance, unable to respond to simple 1 step commands)         Wheelchair     Assist Is the patient using a wheelchair?: Yes Type of Wheelchair:   (TIS WC)    Wheelchair assist level: Dependent - Patient 0% Max wheelchair distance: >318ft    Wheelchair 50 feet with 2 turns activity    Assist        Assist Level: Dependent - Patient 0%   Wheelchair 150 feet activity     Assist      Assist Level: Dependent - Patient 0%   Blood pressure 122/89, pulse 71, temperature (!) 97.5 F (36.4 C), temperature source Oral, resp. rate 18, height 5' 9 (1.753 m), weight 116.5 kg, SpO2 99%.  Medical Problem List and Plan: 1. Functional deficits secondary to generalized weakness due to parkinson disease. Pt has DBS.              -patient may shower             -ELOS/Goals: 14-16, PT sup to CGA, OT mon/mod A, SLP sup to min A             11/29 -pt with worsening dysarthria and weakness this week. Both HCT and EEG reviewed. No acute changes on CT and EEG demonstrates diffuse slowing. Neurology did not recommend any changes yesterday.    - I don't see any infectious, pharmaceutical, or metabolic reasons for presentation. Will go ahead and check cmet and cbc today    -likely progression of PD unfortunately   -continue to monitor 2.  Antithrombotics: -DVT/anticoagulation:  Pharmaceutical: continue Lovenox              -antiplatelet therapy: ASA  3. Episodes of blank stares: EEG ordered and is consistent with encephalopathy- no seizures evident  4. Mood/Behavior/Sleep: LCSW to follow for evaluation and support.              -antipsychotic agents: N/A  5. Neuropsych/cognition: This patient may be intermittent  capable of making decisions on his own behalf.  6. Skin/Wound Care: Routine pressure relief measures  7. Class 2 obesity: provided wife with a list of foods for weight loss  8. PD s/p DBS/Autonomic dysfunction: Monitor for orthostatic symptoms with increase in activity             -continue sinemet  ER five times a day.  --Pimavanserin  daily to  manage hallucinations  Continue metanx 11/29 pt is on a fairly aggressive  regimen. Will not adjust any further 9. T2DM: Monitor BS ac/hs, d/c ISS to help get patient on home regimen, provided a list of foods that are good for diabetes             --On Lantus  15 units BId with novolog  4 units TID ac. Start metformin  500mg  daily with breakfast   CBG (last 3)  Recent Labs    12/07/23 1645 12/07/23 2050 12/08/23 0653  GLUCAP 148* 127* 121*    11/29 cbg's appear fairly well controlled 10. HFrEF: Strict I/O. Daily weights and monitor for  signs of overload.              --Low salt diet. Continue ASA, Lasix , Cozaar , Toprol  XL and aldactone . Asked nursing to please weigh him today, lasix  10mg  additional ordered 11/28 for slight increase in weight  11. Glaucoma: Managed with cosopt   12. Hyperactive airway disease: On Breo.   13. Thrush: Will add nystatin  mouth wash 14. AKI: SCr on rise to 1.27-->recheck in am.             --Encourage fluid intake. Orthostatic vitals.  15.  Thrombocytopenia: Monitor for signs of bleeding. Recheck CBC in am.    16. Urinary retention: d/c mirabegron . Will monitor voiding with PVR checks.   17. Constipation: LBM 11/25, continue miralax  BID, asked nursing to please offer prune juice, magnesium  oxide 200mg  ordered daily  18. Concern for seizures: monitor for signs/symptoms, EEG ordered  19. Spasticity/dystonia: patient follows with Dr. Carilyn for botox  outpatient  20. Mild vascular congestion with mild atelectasis on CXR: O2 ordered with goal sat above 96%, discussed with wife that patient has no use of incentive spirometer. Discussed with patient/wife/therapy weaning O2 with therapy, additional lasix  10 given 11/28 for mild increase in weight, incentive spirometer ordered   Filed Weights   12/05/23 1829 12/06/23 1053 12/07/23 0443  Weight: 115.1 kg 115.2 kg 116.5 kg    LOS: 3 days A FACE TO FACE EVALUATION WAS PERFORMED  Arthea ONEIDA Gunther 12/08/2023, 8:07 AM

## 2023-12-08 NOTE — Progress Notes (Signed)
 Occupational Therapy Session Note  Patient Details  Name: Henry [name] MRN: [id number] Date of Birth: Nov 27, 1950  Today's Date: 12/08/2023 OT Individual Time: 8576-8461 OT Individual Time Calculation (min): 75 min    Short Term Goals: Week 1:  OT Short Term Goal 1 (Week 1): Pt will complete UB bathing with Mod A. OT Short Term Goal 2 (Week 1): Pt will complete UB dressing with Mod A. OT Short Term Goal 3 (Week 1): Pt will complete step pivot transfer from bed <> WC with Mod Ax1 and RW if needed.  Skilled Therapeutic Interventions/Progress Updates:  Skilled OT session completed to address ADL retraining. Pt received seated in TIS [facility] with wife present, agreeable to participate in therapy. Pt reports no pain.  Pt dependently propelled to bathroom, WC>TTB stand pivot Mod A to reach standing at grab bars. In standing, Pt completed LB dressing with Max A, pt able to remove clothing from bottom, displaying increased fear of falling during task. Pt doffed shirt with Max A to pull over head and remove BUE. Pt completed shower level ADLs  seated at TTB with Mod A for UB bathing, OT provided tactile and VC throughout for task initiation. LB bathing with Max A for peri care at bottom and washing feet and lower legs. Pt able to perform anterior peri care standing at grab bars. Pt completed UB dressing with Mod A to pull shirt over head, pt able to don BUE with increased time. Pt completed LB dressing with Max A to weave in BLE and pull pants over bottom standing at RW. Pt required Mod A to reach standing. TTB>WC stand pivot Mod A to reach standing. Pt dependently propelled to sink to complete oral care with set-up. Pt seated in [facility] with chair alarm on, wife present, and all needs within reach.    Therapy Documentation Precautions:  Precautions Precautions: Fall Recall of Precautions/Restrictions: Impaired Precaution/Restrictions Comments: Parkinson's, deep brain stimulator, R ear  deaf Restrictions Weight Bearing Restrictions Per Provider Order: No  Therapy/Group: Individual Therapy  Kristian Mogg [place]-Chance, MS, OTR/L 12/08/2023, 3:57 PM

## 2023-12-08 NOTE — Progress Notes (Signed)
 Speech Language Pathology Daily Session Note  Patient Details  Name: Henry [name] MRN: [id number] Date of Birth: 01-23-1950  Today's Date: 12/08/2023 SLP Individual Time: 0810-0900 SLP Individual Time Calculation (min): 50 min and Today's Date: 12/08/2023 SLP Missed Time: 10 Minutes Missed Time Reason: Patient fatigue  Short Term Goals: Week 1: SLP Short Term Goal 1 (Week 1): Pt will utilize compensatory speech strategies to maintain 80% intelligibility or greater at word level given modA SLP Short Term Goal 2 (Week 1): Pt will utilize compensatory speech strategies to maintain 60% intelligibility or greater at sentence level given modA SLP Short Term Goal 3 (Week 1): Pt will solve simple problems w/ modA SLP Short Term Goal 4 (Week 1): Pt will sustain attention for ~10 mins w/ modA SLP Short Term Goal 5 (Week 1): Pt will recall recent/relevant info w/ modA SLP Short Term Goal 6 (Week 1): Pt will tolerate regular textures w/ thin liquids w/ no overt s/s of airway invasion 90% of the time or greater  Skilled Therapeutic Interventions:   Pt greeted at bedside. Initially unable to wake pt, but upon return ~10 mins later, he was able to wake w/ verbal greeting and min tactile cues. He was provided w/ calendar to assist w/ orientation and today's date provided via errorless learning. After ~35 min delay, he was able to recall today's date independently. He requested assistance w/ his morning meal, so SLP assisted him w/ set up and feeding. He presented w/ consistent subtle throat clears w/ solids and liquids throughout meal. Unsure if this is baseline, as his wife was not present to confirm. He presented w/ only throat clear x1 w/ meds whole in puree. He required modA to sustain attention for ~8 mins at a time throughout meal.  He demonstrated improved speech intelligibility as compared to eval, as he was able to remain ~60% intelligible @ sentence level w/ minA cues, increasing to ~80%  intelligibility w/ maxA. At the end of tx tasks, he was left in his bed w/ the alarm set and call light within reach. Will continue to dynamically assess diet tolerance and ID need for instrumental evaluations in upcoming tx sessions. Recommend cont ST per POC.   Pain  None reported  Therapy/Group: Individual Therapy  [name] 12/08/2023, 8:48 AM

## 2023-12-08 NOTE — Plan of Care (Signed)
  Problem: RH BOWEL ELIMINATION Goal: RH STG MANAGE BOWEL WITH ASSISTANCE Description: STG Manage Bowel with supervision Assistance. Outcome: Progressing   Problem: RH BLADDER ELIMINATION Goal: RH STG MANAGE BLADDER WITH ASSISTANCE Description: STG Manage Bladder With supervision Assistance Outcome: Progressing   Problem: RH SKIN INTEGRITY Goal: RH STG SKIN FREE OF INFECTION/BREAKDOWN Description: Manage skin free of infection with supervision assistance  Outcome: Progressing   Problem: RH SAFETY Goal: RH STG ADHERE TO SAFETY PRECAUTIONS W/ASSISTANCE/DEVICE Description: STG Adhere to Safety Precautions With supervision Assistance/Device. Outcome: Progressing

## 2023-12-08 NOTE — Progress Notes (Signed)
 Physical Therapy Session Note  Patient Details  Name: Henry [name] MRN: [id number] Date of Birth: 1950/11/23  Today's Date: 12/08/2023 PT Individual Time: 1101-1156 PT Individual Time Calculation (min): 55 min   Short Term Goals: Week 1:  PT Short Term Goal 1 (Week 1): pt will transfer supine<>sitting EOB with mod A of 1 PT Short Term Goal 2 (Week 1): pt will transfer bed<>chair with LRAD and max A of 1 PT Short Term Goal 3 (Week 1): pt will transfer sit<>stand with max A of 1  Skilled Therapeutic Interventions/Progress Updates:   Received pt supine in bed with RN and NT changing pt. Pt agreeable to PT treatment and denied any pain during session. Of note, pt MUCH more alert, awake, responsive, and communicating with therapist today presenting completley different from yesterday. Pt on 2L O2 - removed and SPO2 94% on RA (RN notified).  Session with emphasis on functional mobility/transfers, dressing, generalized strengthening and endurance, dynamic standing balance/coordination, and gait training. Donned scrub pants in supine with max A. Pt transferred semi-reclined<>sitting R EOB with HOB elevated and heavy max A for BLE management and trunk control with cues for sequencing. Pt required max A to scoot hips to EOB and max A fading to CGA for sitting balance. Donned socks and shoes max A. Stood from elevated EOB in [place] with mod A and transferred to TIS WC dependently. Stood from [employer] flaps with CGA and able to scoot hips back into WC without assist, just verbal cues.   Pt transported to/from room in TIS WC dependently. Stood in // bars x 4 trials with min A! Pt ambulated 60ft x 2 trials and 62ft x 2 trial in // bars with min A fading to CGA with +2 for WC follow for safety. Pt required cues to advance BUEs forward and noted festinating gait pattern with mild retropulsion. Pt required extensive seated rest break in between trials but very proud of himself. Returned to room and concluded  session with pt sitting in TIS WC, needs within reach, and seatbelt alarm on.   Therapy Documentation Precautions:  Precautions Precautions: Fall Recall of Precautions/Restrictions: Impaired Precaution/Restrictions Comments: Parkinson's, deep brain stimulator, R ear deaf Restrictions Weight Bearing Restrictions Per Provider Order: No  Therapy/Group: Individual Therapy [name] [name] PT, DPT 12/08/2023, 6:54 AM

## 2023-12-08 NOTE — Plan of Care (Signed)
  Problem: Consults Goal: RH GENERAL PATIENT EDUCATION Description: See Patient Education module for education specifics. Outcome: Progressing   Problem: RH BLADDER ELIMINATION Goal: RH STG MANAGE BLADDER WITH ASSISTANCE Description: STG Manage Bladder With supervision  Assistance Outcome: Progressing   Problem: RH SKIN INTEGRITY Goal: RH STG SKIN FREE OF INFECTION/BREAKDOWN Description: Manage skin free of infection with supervision assistance Outcome: Progressing   Problem: RH SAFETY Goal: RH STG ADHERE TO SAFETY PRECAUTIONS W/ASSISTANCE/DEVICE Description: STG Adhere to Safety Precautions With supervision Assistance/Device. Outcome: Progressing   Problem: RH PAIN MANAGEMENT Goal: RH STG PAIN MANAGED AT OR BELOW PT'S PAIN GOAL Description: <4 w/ prns Outcome: Progressing   Problem: RH KNOWLEDGE DEFICIT GENERAL Goal: RH STG INCREASE KNOWLEDGE OF SELF CARE AFTER HOSPITALIZATION Description: Manage increase knowledge of self care after hospitalization with supervision assistance from family using educational materials provided Outcome: Progressing

## 2023-12-09 ENCOUNTER — Other Ambulatory Visit: Payer: Self-pay | Admitting: Cardiology

## 2023-12-09 ENCOUNTER — Other Ambulatory Visit: Payer: Self-pay | Admitting: Family Medicine

## 2023-12-09 DIAGNOSIS — R471 Dysarthria and anarthria: Secondary | ICD-10-CM | POA: Diagnosis not present

## 2023-12-09 DIAGNOSIS — E119 Type 2 diabetes mellitus without complications: Secondary | ICD-10-CM | POA: Diagnosis not present

## 2023-12-09 DIAGNOSIS — D696 Thrombocytopenia, unspecified: Secondary | ICD-10-CM | POA: Diagnosis not present

## 2023-12-09 DIAGNOSIS — G20B1 Parkinson's disease with dyskinesia, without mention of fluctuations: Secondary | ICD-10-CM | POA: Diagnosis not present

## 2023-12-09 DIAGNOSIS — I509 Heart failure, unspecified: Secondary | ICD-10-CM

## 2023-12-09 LAB — GLUCOSE, CAPILLARY
Glucose-Capillary: 171 mg/dL — ABNORMAL HIGH (ref 70–99)
Glucose-Capillary: 146 mg/dL — ABNORMAL HIGH (ref 70–99)
Glucose-Capillary: 107 mg/dL — ABNORMAL HIGH (ref 70–99)
Glucose-Capillary: 124 mg/dL — ABNORMAL HIGH (ref 70–99)

## 2023-12-09 NOTE — Progress Notes (Signed)
 PROGRESS NOTE   Subjective/Complaints: Pt able to sleep last night. No new issues reported by nursing. Still requiring mod-max with therapies.   ROS: limited due to language/communication   Objective:   CT HEAD WO CONTRAST ( ) Result Date: 12/07/2023 EXAM: CT HEAD WITHOUT CONTRAST 12/07/2023 11:30:30 AM TECHNIQUE: CT of the head was performed without the administration of intravenous contrast. Automated exposure control, iterative reconstruction, and/or weight based adjustment of the mA/kV was utilized to reduce the radiation dose to as low as reasonably achievable. COMPARISON: CT of the head dated 11/29/2023. CLINICAL HISTORY: Neuro deficit, acute, stroke suspected. FINDINGS: BRAIN AND VENTRICLES: No acute hemorrhage. No evidence of acute infarct. No hydrocephalus. No extra-axial collection. No mass effect or midline shift. Age-related cerebral volume loss present. Deep brain stimulators again demonstrated. ORBITS: No acute abnormality. SINUSES: No acute abnormality. SOFT TISSUES AND SKULL: No acute soft tissue abnormality. No skull fracture. Status post right mastoidectomy. IMPRESSION: 1. No acute intracranial abnormality. 2. Age-related cerebral volume loss. 3. Deep brain stimulators again demonstrated. 4. Status post right mastoidectomy. Electronically signed by: Evalene Coho MD 12/07/2023 11:50 AM EST RP Workstation: [id number]   EEG adult Result Date: 12/07/2023 Gregg Lek, MD     12/07/2023  8:48 AM Patient Name: Henry [name] MRN: [id number] Epilepsy Attending: Lek Gregg Referring Physician/Provider: No ref. provider found     Date: 12/07/2023 Duration: 28 minutes Patient history: 73 year old man with generalized weakness and altered mental status. EEG to evaluate for seizure. Level of alertness: Lethargic AEDs during EEG study: None Technical aspects: This EEG study was done with scalp electrodes positioned according to  the 10-20 International system of electrode placement. Electrical activity was reviewed with band pass filter of 1-70Hz , sensitivity of 7 uV/mm, display speed of 18mm/sec with a 60Hz  notched filter applied as appropriate. EEG data were recorded continuously and digitally stored.  Video monitoring was available and reviewed as appropriate. Description: EEG showed continuous generalized 3 to 6 Hz theta-delta slowing. Hyperventilation and photic stimulation were not performed.   ABNORMALITY - Continuous slow, generalized IMPRESSION: This study is suggestive of moderate diffuse brain dysfunction such as encephalopathy, nonspecific etiology but likely related to sedation, toxic-metabolic etiology. No seizures or epileptiform discharges were seen throughout the recording. Lek Gregg MD Neurology    Recent Labs    12/08/23 0955  WBC 5.6  HGB 13.7  HCT 42.7  PLT 123*   Recent Labs    12/08/23 0955  NA 140  K 4.3  CL 94*  CO2 37*  GLUCOSE 130*  BUN 20  CREATININE 1.24  CALCIUM  8.5*    Intake/Output Summary (Last 24 hours) at 12/09/2023 0736 Last data filed at 12/08/2023 1800 Gross per 24 hour  Intake 652 ml  Output --  Net 652 ml        Physical Exam: Vital Signs Blood pressure 129/85, pulse 71, temperature 97.7 F (36.5 C), temperature source Oral, resp. rate 20, height 5' 9 (1.753 m), weight 116.5 kg, SpO2 98%. Constitutional: No distress . Vital signs reviewed. obese HEENT: NCAT, EOMI, oral membranes moist Neck: supple Cardiovascular: RRR without murmur. No JVD    Respiratory/Chest: CTA Bilaterally without wheezes or  rales. Normal effort. O2    GI/Abdomen: BS +, non-tender, non-distended Ext: no clubbing, cyanosis, or edema Psych: flat but cooperative  Skin: Bruising noted on bilateral upper extremities Neuro:     Mental Status: AAOx4, memory deficits present, intermittant confusion, delayed responses, able to provide his date of birth Speech/Languate: Naming and  repetition intact,   severe dysarthria this morning, follows simple commands. Pill rolling tremor bilaterally CRANIAL NERVES: II: PERRL. Visual fields full III, IV, VI: EOM intact, no gaze preference or deviation V: normal sensation bilaterally VII: no asymmetry VIII: normal hearing to speech IX, X: normal palatal elevation XI: head turn intact b/l XII: Tongue midline     MOTOR: Pt moves BUE and BLE with 2/5 to 3/5, struggles to initiate   SENSORY: Normal to touch all 4 extremities   persistent rigidity in b/l legs Right greater than left         Assessment/Plan: 1. Functional deficits which require 3+ hours per day of interdisciplinary therapy in a comprehensive inpatient rehab setting. Physiatrist is providing close team supervision and 24 hour management of active medical problems listed below. Physiatrist and rehab team continue to assess barriers to discharge/monitor patient progress toward functional and medical goals  Care Tool:  Bathing    Body parts bathed by patient: Face, Right arm, Left arm, Chest   Body parts bathed by helper: Abdomen, Front perineal area, Buttocks, Right upper leg, Left upper leg, Right lower leg, Left lower leg     Bathing assist Assist Level: Maximal Assistance - Patient 24 - 49%     Upper Body Dressing/Undressing Upper body dressing   What is the patient wearing?: Pull over shirt    Upper body assist Assist Level: Maximal Assistance - Patient 25 - 49%    Lower Body Dressing/Undressing Lower body dressing      What is the patient wearing?: Pants, Incontinence brief     Lower body assist Assist for lower body dressing: Dependent - Patient 0%     Toileting Toileting    Toileting assist Assist for toileting: Dependent - Patient 0%     Transfers Chair/bed transfer  Transfers assist     Chair/bed transfer assist level: Dependent - Patient 0%     Locomotion Ambulation   Ambulation assist   Ambulation activity did  not occur: Safety/medical concerns (decresed responsiveness, weakness, decreased balance, unable to respond to simple 1 step commands)  Assist level: 2 helpers Assistive device: Parallel bars Max distance: 76ft   Walk 10 feet activity   Assist  Walk 10 feet activity did not occur: Safety/medical concerns (decresed responsiveness, weakness, decreased balance, unable to respond to simple 1 step commands)  Assist level: 2 helpers Assistive device: Parallel bars   Walk 50 feet activity   Assist Walk 50 feet with 2 turns activity did not occur: Safety/medical concerns (decresed responsiveness, weakness, decreased balance, unable to respond to simple 1 step commands)         Walk 150 feet activity   Assist Walk 150 feet activity did not occur: Safety/medical concerns (decresed responsiveness, weakness, decreased balance, unable to respond to simple 1 step commands)         Walk 10 feet on uneven surface  activity   Assist Walk 10 feet on uneven surfaces activity did not occur: Safety/medical concerns (decresed responsiveness, weakness, decreased balance, unable to respond to simple 1 step commands)         Wheelchair     Assist Is the patient using  a wheelchair?: Yes Type of Wheelchair:  (TIS WC)    Wheelchair assist level: Dependent - Patient 0% Max wheelchair distance: >358ft    Wheelchair 50 feet with 2 turns activity    Assist        Assist Level: Dependent - Patient 0%   Wheelchair 150 feet activity     Assist      Assist Level: Dependent - Patient 0%   Blood pressure 129/85, pulse 71, temperature 97.7 F (36.5 C), temperature source Oral, resp. rate 20, height 5' 9 (1.753 m), weight 116.5 kg, SpO2 98%.  Medical Problem List and Plan: 1. Functional deficits secondary to generalized weakness due to parkinson disease. Pt has DBS.              -patient may shower             -ELOS/Goals: 14-16, PT sup to CGA, OT mon/mod A, SLP sup to  min A             11/30 -pt with worsening dysarthria and weakness this past week. Both HCT and EEG reviewed. No acute changes on CT and EEG demonstrates diffuse slowing. Neurology did not recommend any changes yesterday.    - I don't see any infectious, pharmaceutical, or metabolic reasons for presentation. Labs were ok yesterday   -likely progression of PD unfortunately   -continue to monitor, encourage OOB activity   -perhaps follow up with neurology this week 2.  Antithrombotics: -DVT/anticoagulation:  Pharmaceutical: continue Lovenox              -antiplatelet therapy: ASA  3. Episodes of blank stares: EEG ordered and is consistent with encephalopathy- no seizures evident  4. Mood/Behavior/Sleep: LCSW to follow for evaluation and support.              -antipsychotic agents: N/A  5. Neuropsych/cognition: This patient may be intermittent  capable of making decisions on his own behalf.  6. Skin/Wound Care: Routine pressure relief measures  7. Class 2 obesity: provided wife with a list of foods for weight loss  8. PD s/p DBS/Autonomic dysfunction: Monitor for orthostatic symptoms with increase in activity             -continue sinemet  ER five times a day.  --Pimavanserin  daily to  manage hallucinations  Continue metanx 11/30 pt is on a fairly aggressive regimen. Will not adjust any further  -?neuro follow up  9. T2DM: Monitor BS ac/hs, d/c ISS to help get patient on home regimen, provided a list of foods that are good for diabetes             --On Lantus  15 units BId with novolog  4 units TID ac. Started metformin  500mg  daily with breakfast   CBG (last 3)  Recent Labs    12/08/23 1638 12/08/23 2052 12/09/23 0628  GLUCAP 157* 134* 171*    11/30 cbg's appear fairly well controlled--no changes 10. HFrEF: Strict I/O. Daily weights and monitor for signs of overload.              --Low salt diet. Continue ASA, Lasix , Cozaar , Toprol  XL and aldactone . Asked nursing to please weigh him  today, lasix  10mg  additional ordered 11/28 for slight increase in weight  11. Glaucoma: Managed with cosopt   12. Hyperactive airway disease: On Breo.   13. Thrush: Will add nystatin  mouth wash 14. AKI: SCr sl elevated yesterday--             --Encourage fluid intake. Orthostatic vitals.   -  labs in am 15.  Thrombocytopenia: Monitor for signs of bleeding.  123k     16. Urinary retention: d/c mirabegron . Will monitor voiding with PVR checks.   17. Constipation: LBM 11/29, continue miralax  BID, asked nursing to please offer prune juice, magnesium  oxide 200mg  ordered daily  18. Concern for seizures: monitor for signs/symptoms, EEG ordered  19. Spasticity/dystonia: patient follows with Dr. Carilyn for botox  outpatient  20. Mild vascular congestion with mild atelectasis on CXR: O2 ordered with goal sat above 96%, discussed with wife that patient has no use of incentive spirometer. Discussed with patient/wife/therapy weaning O2 with therapy, additional lasix  10 given 11/28 for mild increase in weight, incentive spirometer ordered  11/30 weights not being recorded daily --re order today Filed Weights   12/05/23 1829 12/06/23 1053 12/07/23 0443  Weight: 115.1 kg 115.2 kg 116.5 kg    LOS: 4 days A FACE TO FACE EVALUATION WAS PERFORMED  Henry [name] 12/09/2023, 7:36 AM

## 2023-12-09 NOTE — Progress Notes (Signed)
 Speech Language Pathology Daily Session Note  Patient Details  Name: Henry [name] MRN: [id number] Date of Birth: [birth year]  Today's Date: 12/09/2023 SLP Individual Time: 1100-1200 SLP Individual Time Calculation (min): 60 min  Short Term Goals: Week 1: SLP Short Term Goal 1 (Week 1): Pt will utilize compensatory speech strategies to maintain 80% intelligibility or greater at word level given modA SLP Short Term Goal 2 (Week 1): Pt will utilize compensatory speech strategies to maintain 60% intelligibility or greater at sentence level given modA SLP Short Term Goal 3 (Week 1): Pt will solve simple problems w/ modA SLP Short Term Goal 4 (Week 1): Pt will sustain attention for ~10 mins w/ modA SLP Short Term Goal 5 (Week 1): Pt will recall recent/relevant info w/ modA SLP Short Term Goal 6 (Week 1): Pt will tolerate regular textures w/ thin liquids w/ no overt s/s of airway invasion 90% of the time or greater  Skilled Therapeutic Interventions:   Pt and his wife greeted at bedside. He was up in his TIS [facility] upon SLP arrival, agreeable to tx tasks targeting communication and cognition. He was within one day of today's date during orientation review, but required maxA to recall therapies the last 24 hrs. SLP facilitated tx task targeting functional problem solving and speech production. He was able to complete mildly complex responsive naming task w/ modA cues to utilize over articulation and maintain 90% intelligibility for word level responses. SLP then challenged him to phrase and sentence level responses during verbal problem solving task (compare/contrast) and he required modA to maintain ~60% intelligibility. He did become slight confused towards the end of tasks, asking what are we doing here? And benefited from errorless learning and encouragement from his wife. Fatigue evident as tasks continued. Of note, he presented w/ intermittent cough as well as wheezing upon inhalation which is new  this date. Increased anterior spillage of secretions noted as well. At the end of tx tasks, he was left reclined in his TIS [facility] w/ the alarm set and call light within reach. His wife remained present as well. Care team notified of symptoms. Recommend cont ST per POC.   Pain Pain Assessment Pain Scale: 0-10 Pain Score: 0-No pain  Therapy/Group: Individual Therapy  [name] 12/09/2023, 11:23 AM

## 2023-12-09 NOTE — Progress Notes (Signed)
 Occupational Therapy Session Note  Patient Details  Name: Henry [name] MRN: [id number] Date of Birth: 1950/12/26  Today's Date: 12/09/2023 OT Individual Time: 0100-0215 OT Individual Time Calculation (min): 75 min    Short Term Goals: Week 1:  OT Short Term Goal 1 (Week 1): Pt will complete UB bathing with Mod A. OT Short Term Goal 2 (Week 1): Pt will complete UB dressing with Mod A. OT Short Term Goal 3 (Week 1): Pt will complete step pivot transfer from bed <> WC with Mod Ax1 and RW if needed.  Skilled Therapeutic Interventions/Progress Updates:     Initial Encounter:  Patient seated w/c LOF with family present at the time of arrival. Family encourage to let the patient self-fed to improve upon functional Ind. The pt was able to consume > 80% of his meal. The pt was in agreement with completing sit to stands using the stedy.   BADL Related Task: The pt was able to transfer from the w/c to the stedy incorporating BJE for grabing the bar. The pt was able to come from sit to stand with MinA. The pt was transported to the restroom and was able doff his LB garments a brief and shorts with MaxA. The pt was able to realign himself using the grab bars for pulling up and repositioning on the commode. The pt was Dep with toileting hyigene following a bowel mvmt. The pt was able to come from sit to stand incorporating the handle of the stedy for donning his brief and shorts with MaxA. The pt was transported to the sink and was able to wash his hands with s/uA.  Transfers: The pt went on to complete 3 sit to stands at [facility]. The pt was able to complete the Squige by alternating hands and retrieving by colors call out and placing them into the container. At the end of the session, the pt remained at w/c LOF of function with the chair in a tilted position.  The bedside table and call light were placed within reach with all additional needs addressed prior to exiting the room.   Therapy  Documentation Precautions:  Precautions Precautions: Fall Recall of Precautions/Restrictions: Impaired Precaution/Restrictions Comments: Parkinson's, deep brain stimulator, R ear deaf Restrictions Weight Bearing Restrictions Per Provider Order: No   Therapy/Group: Individual Therapy  [name] 12/09/2023, 4:17 PM

## 2023-12-09 NOTE — Progress Notes (Signed)
 Physical Therapy Session Note  Patient Details  Name: Henry [name] MRN: [id number] Date of Birth: 25-Feb-1950  Today's Date: 12/09/2023 PT Individual Time: 9162-9065 PT Individual Time Calculation (min): 57 min   Short Term Goals: Week 1:  PT Short Term Goal 1 (Week 1): pt will transfer supine<>sitting EOB with mod A of 1 PT Short Term Goal 2 (Week 1): pt will transfer bed<>chair with LRAD and max A of 1 PT Short Term Goal 3 (Week 1): pt will transfer sit<>stand with max A of 1  Skilled Therapeutic Interventions/Progress Updates:    Pt in bed upon arrival. Max assist donning pants, incorporating rolling and attempts at bridging. Bed mobility: supine>sitting EOB with HOB elevated appox 30 degrees. Pt able to bring LEs to edge, max assist with trunk to achieve sitting position. Mod assist with balance initially, reducing to supervision with time. Cues and max assist to scoot toward EOB. Pt able to maintain sitting balance without support for donning shoes. Transfer: stedy utilized with min assist to transfer bed>w/c. Cues to utilize LUE with stedy. Transported via w/c to gym parallel bars. Repeat sit<>stands with focus on posture- difficulty achieving full standing position but able to achieve with repetition and cues. Advancing to marching and then gait in bars for 8 ft. Advanced to sit<>stand from w/c using rw. Pt declining hand on rw and w/c- preferring bilateral from w/c. Difficulty with transition from chair to rw. Repeated with both hands on rw (pt request)- but unable to achieve full standing posture. Repeat x2 but unable to advance to gait. Pt quickly fatiguing by end of session. Pt returned to room, up in chair with alarm on and call light in reach. Nursing notified of pt desire to eat breakfast.   Therapy Documentation Precautions:  Precautions Precautions: Fall Recall of Precautions/Restrictions: Impaired Precaution/Restrictions Comments: Parkinson's, deep brain stimulator, R ear  deaf Restrictions Weight Bearing Restrictions Per Provider Order: No    Pain: Denies pain throughout session   Therapy/Group: Individual Therapy  Christopherjohn Schiele 12/09/2023, 10:42 AM

## 2023-12-09 NOTE — Plan of Care (Signed)
  Problem: RH BOWEL ELIMINATION Goal: RH STG MANAGE BOWEL WITH ASSISTANCE Description: STG Manage Bowel with supervision Assistance. Outcome: Progressing   Problem: RH BLADDER ELIMINATION Goal: RH STG MANAGE BLADDER WITH ASSISTANCE Description: STG Manage Bladder With supervision Assistance Outcome: Progressing   Problem: RH SKIN INTEGRITY Goal: RH STG SKIN FREE OF INFECTION/BREAKDOWN Description: Manage skin free of infection with supervision assistance  Outcome: Progressing   Problem: RH SAFETY Goal: RH STG ADHERE TO SAFETY PRECAUTIONS W/ASSISTANCE/DEVICE Description: STG Adhere to Safety Precautions With supervision Assistance/Device. Outcome: Progressing   Problem: RH PAIN MANAGEMENT Goal: RH STG PAIN MANAGED AT OR BELOW PT'S PAIN GOAL Description: <4 w/ prns Outcome: Progressing

## 2023-12-10 DIAGNOSIS — G20A1 Parkinson's disease without dyskinesia, without mention of fluctuations: Secondary | ICD-10-CM | POA: Diagnosis not present

## 2023-12-10 LAB — BASIC METABOLIC PANEL WITH GFR
Anion gap: 8 (ref 5–15)
BUN: 24 mg/dL — ABNORMAL HIGH (ref 8–23)
CO2: 37 mmol/L — ABNORMAL HIGH (ref 22–32)
Calcium: 8.7 mg/dL — ABNORMAL LOW (ref 8.9–10.3)
Chloride: 93 mmol/L — ABNORMAL LOW (ref 98–111)
Creatinine, Ser: 1.31 mg/dL — ABNORMAL HIGH (ref 0.61–1.24)
GFR, Estimated: 58 mL/min — ABNORMAL LOW (ref >60)
Glucose, Bld: 128 mg/dL — ABNORMAL HIGH (ref 70–99)
Potassium: 3.6 mmol/L (ref 3.5–5.1)
Sodium: 138 mmol/L (ref 135–145)

## 2023-12-10 LAB — CBC
HCT: 42.1 % (ref 39.0–52.0)
Hemoglobin: 13.3 g/dL (ref 13.0–17.0)
MCH: 28.7 pg (ref 26.0–34.0)
MCHC: 31.6 g/dL (ref 30.0–36.0)
MCV: 90.7 fL (ref 80.0–100.0)
Platelets: 139 K/uL — ABNORMAL LOW (ref 150–400)
RBC: 4.64 MIL/uL (ref 4.22–5.81)
RDW: 14 % (ref 11.5–15.5)
WBC: 7.2 K/uL (ref 4.0–10.5)
nRBC: 0 % (ref 0.0–0.2)

## 2023-12-10 LAB — GLUCOSE, CAPILLARY
Glucose-Capillary: 119 mg/dL — ABNORMAL HIGH (ref 70–99)
Glucose-Capillary: 121 mg/dL — ABNORMAL HIGH (ref 70–99)
Glucose-Capillary: 132 mg/dL — ABNORMAL HIGH (ref 70–99)
Glucose-Capillary: 129 mg/dL — ABNORMAL HIGH (ref 70–99)

## 2023-12-10 MED ORDER — FUROSEMIDE 20 MG PO TABS
30.0000 mg | ORAL_TABLET | Freq: Every day | ORAL | Status: DC
  Administered 2023-12-11 – 2023-12-13 (×3): 30 mg via ORAL
  Filled 2023-12-10 (×3): qty 2

## 2023-12-10 MED ORDER — POLYETHYLENE GLYCOL 3350 17 G PO PACK
8.5000 g | PACK | Freq: Every day | ORAL | Status: DC
  Administered 2023-12-11: 8.5 g via ORAL
  Filled 2023-12-10: qty 1

## 2023-12-10 NOTE — Progress Notes (Signed)
 Inpatient Rehabilitation Care Coordinator Assessment and Plan Patient Details  Name: Henry [name] MRN: [id number] Date of Birth: [birth year]  Today's Date: 12/10/2023  Hospital Problems: Principal Problem:   Parkinson's disease [facility])  Past Medical History:  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)   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   Dysrhythmia    a-fib   GERD (gastroesophageal reflux disease)    Glaucoma    right eye   History of kidney stones    multiple   HLD (hyperlipidemia)    HTN (hypertension)    pt denies 08/19/12   Hyperplasia, prostate    Hypothyroidism    MI (myocardial infarction) (HCC)    Dr Pietro 2000, x3vessels bypass   Neuromuscular disorder (HCC)    Parkinson's Disease   OSA (obstructive sleep apnea)    AHI-28,on CPAP, noncompliant with CPAP   Parkinson disease (HCC)    1999   PONV (postoperative nausea and vomiting)    Restless legs    Shortness of breath    Hx: of at all times   UTI (lower urinary tract infection) 09/15/2012   Klebsiella   Ventral hernia    Walker as ambulation aid    also uses wheelchair at home/when going out   Past Surgical History:  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   BIOPSY  10/14/2018   Procedure: BIOPSY;  Surgeon: Albertus Gordy HERO, MD;  Location: [facility];  Service: Gastroenterology;;   CATARACT EXTRACTION W/ INTRAOCULAR LENS IMPLANT     Hx: of right eye   CATARACT EXTRACTION W/ INTRAOCULAR LENS IMPLANT Left 2018   COLONOSCOPY N/A 10/13/2014   Procedure: COLONOSCOPY;   Surgeon: Gordy HERO Albertus, MD;  Location: [facility];  Service: Gastroenterology;  Laterality: N/A;   COLONOSCOPY W/ BIOPSIES AND POLYPECTOMY     Hx: of   COLONOSCOPY WITH PROPOFOL  N/A 10/14/2018   Procedure: COLONOSCOPY WITH PROPOFOL ;  Surgeon: Albertus Gordy HERO, MD;  Location: [facility];  Service: Gastroenterology;  Laterality: N/A;   CORONARY ARTERY BYPASS GRAFT  2000   Sudie Laine, MD   CORONARY STENT PLACEMENT  1998   DEEP BRAIN STIMULATOR PLACEMENT  2004   Right and left VIN stimulator placement (parkinsons)   EYE SURGERY     Cataract removed in 1980s related to wood in eye when 73 years old   FINGER AMPUTATION     left pointer   IMPLANTABLE CARDIOVERTER DEFIBRILLATOR IMPLANT N/A 11/13/2013   Procedure: IMPLANTABLE CARDIOVERTER DEFIBRILLATOR IMPLANT;  Surgeon: Danelle LELON Waddell, MD;  Location: [facility] CATH LAB;  Service: Cardiovascular;  Laterality: N/A;   INSERT / REPLACE / REMOVE PACEMAKER     medtronic   LEFT AND RIGHT HEART CATHETERIZATION WITH CORONARY ANGIOGRAM N/A 09/24/2013   Procedure: LEFT AND RIGHT HEART CATHETERIZATION WITH CORONARY ANGIOGRAM;  Surgeon: Lonni JONETTA Cash, MD;  Location: [facility] CATH LAB;  Service: Cardiovascular;  Laterality: N/A;   LITHOTRIPSY  3 different times   POLYPECTOMY  10/14/2018   Procedure: POLYPECTOMY;  Surgeon: Albertus Gordy HERO, MD;  Location: [facility];  Service: Gastroenterology;;   PULSE GENERATOR IMPLANT Right 11/13/2017   Procedure: Right chest implantable pulse generator change;  Surgeon: Unice Pac, MD;  Location: [facility] OR;  Service: Neurosurgery;  Laterality: Right;  Right chest implantable pulse generator change   SUBTHALAMIC STIMULATOR BATTERY REPLACEMENT N/A 09/05/2012   Procedure: Deep brain stimulator battery change;  Surgeon: Pac Unice, MD;  Location: [facility];  Service: Neurosurgery;  Laterality: N/A;  Deep brain stimulator battery change   SUBTHALAMIC STIMULATOR BATTERY REPLACEMENT N/A 06/10/2015   Procedure: Deep Brain  stimulator battery change;  Surgeon: Pac Unice, MD;  Location: [facility];  Service: Neurosurgery;  Laterality: N/A;   SUBTHALAMIC STIMULATOR BATTERY REPLACEMENT Right 09/25/2019   Procedure: Deep brain stimulator battery change;  Surgeon: Unice Pac, MD;  Location: [facility];  Service: Neurosurgery;  Laterality: Right;   SUBTHALAMIC STIMULATOR BATTERY REPLACEMENT Right 12/15/2022   Procedure: IMPLANTABLE PULSE GENERATOR  BATTERY REPLACEMENT;  Surgeon: Carollee Lani BROCKS, DO;  Location: [facility] OR;  Service: Neurosurgery;  Laterality: Right;  3C   TONSILLECTOMY     Social History:  reports that he has never smoked. He has never been exposed to tobacco smoke. He has never used smokeless tobacco. He reports that he does not currently use alcohol. He reports that he does not use drugs.  Family / Support Systems Marital Status: Married Patient Roles: Spouse Spouse/Significant Other: [name] Children: Scientist, Research (medical) Anticipated Caregiver: Niels and Delon (dtr) [phone] Ability/Limitations of Caregiver: None stated Caregiver Availability: 24/7 Family Dynamics: Good family support  Social History Preferred language: English Religion: [employer] - How often do you need to have someone help you when you read instructions, pamphlets, or other written material from your doctor or pharmacy?: Sometimes Writes: No Employment Status: Retired   Abuse/Neglect Abuse/Neglect Assessment Can Be Completed: Yes Physical Abuse: Denies Verbal Abuse: Denies Sexual Abuse: Denies Exploitation of patient/patient's resources: Denies Self-Neglect: Denies  Patient response to: Social Isolation - How often do you feel lonely or isolated from those around you?: Never  Emotional Status Pt's affect, behavior and adjustment status: Adjusting to therapy Recent Psychosocial Issues: None Psychiatric History: None Substance Abuse History: None  Patient / Family Perceptions, Expectations & Goals Pt/Family  understanding of illness & functional limitations: Family understanding of illness & functional limitation Premorbid pt/family roles/activities: Homebound Anticipated changes in roles/activities/participation: Limited activity Pt/family expectations/goals: Family has realistic expectations/goals  [employer]: None Premorbid Home Care/DME Agencies: Other (Comment) Transportation available at discharge: Yes Is the patient able to respond to transportation needs?: Yes In the past 12 months, has lack of transportation kept you from medical appointments or from getting medications?: No In the past 12 months, has lack of transportation kept you from meetings, work, or from getting things needed for daily living?: No Resource referrals recommended: Neuropsychology  Discharge Planning Living Arrangements: Spouse/significant other Support Systems: Spouse/significant other Type of Residence: Private residence Insurance Resources: Electrical Engineer Resources: Family Support Financial Screen Referred: Yes Living Expenses: Own Money Management: Spouse Does the patient have any problems obtaining your medications?: No Home Management: Patient's spouse Comptroller home Care Coordinator Anticipated Follow Up Needs: HH/OP Expected length of stay: 14-16 days  Clinical Impression CSW met with patient/family to introduce herself and complete initial assessment. Patient is able to make needs known. Patient presented to [facility] due to Parkinson's Disease. Mr. Blue lives with  his wife and has support from his daughter Delon and other family members as needed. There is a ramp to the back entrance of the home. Mr. Wages has DME: Agricultural Consultant (2 wheels), Grab bars - toilet, Grab bars - tub/shower, Shower seat - built in, [employer] head, Transport chair, Lift chair (U-step walker). Mr. Goody is signed up for neuropsych on Wednesday 12/03.  There were no further  needs or concerns at present. CSW will follow up with family and continue to follow. Will provide patient/family with an update as soon as one becomes available.   [name]  Loreli 12/10/2023, 9:29 AM

## 2023-12-10 NOTE — Progress Notes (Signed)
 Patient ID: Henry [name], male   DOB: 11-Oct-1950, 73 y.o.   MRN: [id number]  Statement of service delivered.

## 2023-12-10 NOTE — Progress Notes (Signed)
 Inpatient Rehabilitation Center Individual Statement of Services  Patient Name:  Henry [name]  Date:  12/10/2023  Welcome to the Inpatient Rehabilitation Center.  Our goal is to provide you with an individualized program based on your diagnosis and situation, designed to meet your specific needs.  With this comprehensive rehabilitation program, you will be expected to participate in at least 3 hours of rehabilitation therapies Monday-Friday, with modified therapy programming on the weekends.  Your rehabilitation program will include the following services:  Physical Therapy (PT), Occupational Therapy (OT), Speech Therapy (ST), 24 hour per day rehabilitation nursing, Therapeutic Recreaction (TR), Neuropsychology, Care Coordinator, Rehabilitation Medicine, Nutrition Services, and Pharmacy Services  Weekly team conferences will be held on Wednesday to discuss your progress.  Your Inpatient Rehabilitation Care Coordinator will talk with you frequently to get your input and to update you on team discussions.  Team conferences with you and your family in attendance may also be held.  Expected length of stay: 14-16 days  Overall anticipated outcome: Contact Guard/Touching assist    Depending on your progress and recovery, your program may change. Your Inpatient Rehabilitation Care Coordinator will coordinate services and will keep you informed of any changes. Your Inpatient Rehabilitation Care Coordinator's name and contact numbers are listed  below.  The following services may also be recommended but are not provided by the Inpatient Rehabilitation Center:  Driving Evaluations Home Health Rehabiltiation Services Outpatient Rehabilitation Services Vocational Rehabilitation   Arrangements will be made to provide these services after discharge if needed.  Arrangements include referral to agencies that provide these services.  Your insurance has been verified to be: MEDICARE / MEDICARE PART A AND B   Your primary doctor is:  Cleatus Arlyss RAMAN, MD  Pertinent information will be shared with your doctor and your insurance company.  Inpatient Rehabilitation Care Coordinator:  [name] Loreli SIERRAS [phone] or ELIGAH BRINKS  Information discussed with and copy given to patient by: Waverly Loreli, 12/10/2023, 9:53 AM

## 2023-12-10 NOTE — Progress Notes (Signed)
 PROGRESS NOTE   Subjective/Complaints: Complaints that the air is stuffy No other complaints this morning Working with PT in the gym Discussed worsening creatinine  ROS: Limited due to language/communication   Objective:   No results found.  Recent Labs    12/08/23 0955 12/10/23 0502  WBC 5.6 7.2  HGB 13.7 13.3  HCT 42.7 42.1  PLT 123* 139*   Recent Labs    12/08/23 0955 12/10/23 0502  NA 140 138  K 4.3 3.6  CL 94* 93*  CO2 37* 37*  GLUCOSE 130* 128*  BUN 20 24*  CREATININE 1.24 1.31*  CALCIUM  8.5* 8.7*    Intake/Output Summary (Last 24 hours) at 12/10/2023 0950 Last data filed at 12/10/2023 0900 Gross per 24 hour  Intake 360 ml  Output 250 ml  Net 110 ml        Physical Exam: Vital Signs Blood pressure 108/75, pulse 63, temperature (!) 97.5 F (36.4 C), resp. rate 18, height 5' 9 (1.753 m), weight 113.1 kg, SpO2 95%. Constitutional: No distress . Vital signs reviewed. obese HEENT: NCAT, EOMI, oral membranes moist Neck: supple Cardiovascular: RRR without murmur. No JVD    Respiratory/Chest: CTA Bilaterally without wheezes or rales. Normal effort. O2 [place]   GI/Abdomen: BS +, non-tender, non-distended Ext: no clubbing, cyanosis, or edema Psych: flat but cooperative  Skin: Bruising noted on bilateral upper extremities Neuro:     Mental Status: AAOx4, memory deficits present, intermittant confusion, delayed responses, able to provide his date of birth Speech/Languate: Naming and repetition intact,   severe dysarthria this morning, follows simple commands. Pill rolling tremor bilaterally CRANIAL NERVES: II: PERRL. Visual fields full III, IV, VI: EOM intact, no gaze preference or deviation V: normal sensation bilaterally VII: no asymmetry VIII: normal hearing to speech IX, X: normal palatal elevation XI: head turn intact b/l XII: Tongue midline     MOTOR: Pt moves BUE and BLE with 2/5 to 3/5,  struggles to initiate, stable 12/1   SENSORY: Normal to touch all 4 extremities   persistent rigidity in b/l legs Right greater than left         Assessment/Plan: 1. Functional deficits which require 3+ hours per day of interdisciplinary therapy in a comprehensive inpatient rehab setting. Physiatrist is providing close team supervision and 24 hour management of active medical problems listed below. Physiatrist and rehab team continue to assess barriers to discharge/monitor patient progress toward functional and medical goals  Care Tool:  Bathing    Body parts bathed by patient: Face, Right arm, Left arm, Chest   Body parts bathed by helper: Abdomen, Front perineal area, Buttocks, Right upper leg, Left upper leg, Right lower leg, Left lower leg     Bathing assist Assist Level: Maximal Assistance - Patient 24 - 49%     Upper Body Dressing/Undressing Upper body dressing   What is the patient wearing?: Pull over shirt    Upper body assist Assist Level: Maximal Assistance - Patient 25 - 49%    Lower Body Dressing/Undressing Lower body dressing      What is the patient wearing?: Pants, Incontinence brief     Lower body assist Assist for lower body  dressing: Dependent - Patient 0%     Toileting Toileting    Toileting assist Assist for toileting: Dependent - Patient 0%     Transfers Chair/bed transfer  Transfers assist     Chair/bed transfer assist level: Dependent - Patient 0%     Locomotion Ambulation   Ambulation assist   Ambulation activity did not occur: Safety/medical concerns (decresed responsiveness, weakness, decreased balance, unable to respond to simple 1 step commands)  Assist level: 2 helpers Assistive device: Parallel bars Max distance: 68ft   Walk 10 feet activity   Assist  Walk 10 feet activity did not occur: Safety/medical concerns (decresed responsiveness, weakness, decreased balance, unable to respond to simple 1 step  commands)  Assist level: 2 helpers Assistive device: Parallel bars   Walk 50 feet activity   Assist Walk 50 feet with 2 turns activity did not occur: Safety/medical concerns (decresed responsiveness, weakness, decreased balance, unable to respond to simple 1 step commands)         Walk 150 feet activity   Assist Walk 150 feet activity did not occur: Safety/medical concerns (decresed responsiveness, weakness, decreased balance, unable to respond to simple 1 step commands)         Walk 10 feet on uneven surface  activity   Assist Walk 10 feet on uneven surfaces activity did not occur: Safety/medical concerns (decresed responsiveness, weakness, decreased balance, unable to respond to simple 1 step commands)         Wheelchair     Assist Is the patient using a wheelchair?: Yes Type of Wheelchair: Manual (TIS WC)    Wheelchair assist level: Dependent - Patient 0% Max wheelchair distance: >351ft    Wheelchair 50 feet with 2 turns activity    Assist        Assist Level: Dependent - Patient 0%   Wheelchair 150 feet activity     Assist      Assist Level: Dependent - Patient 0%   Blood pressure 108/75, pulse 63, temperature (!) 97.5 F (36.4 C), resp. rate 18, height 5' 9 (1.753 m), weight 113.1 kg, SpO2 95%.  Medical Problem List and Plan: 1. Functional deficits secondary to generalized weakness due to parkinson disease. Pt has DBS.              -patient may shower             -ELOS/Goals: 14-16, PT sup to CGA, OT mon/mod A, SLP sup to min A             11/30 -pt with worsening dysarthria and weakness this past week. Both HCT and EEG reviewed. No acute changes on CT and EEG demonstrates diffuse slowing. Neurology did not recommend any changes yesterday.    - I don't see any infectious, pharmaceutical, or metabolic reasons for presentation. Labs were ok yesterday   -likely progression of PD unfortunately   -continue to monitor, encourage OOB  activity   -perhaps follow up with neurology this week 2.  Antithrombotics: -DVT/anticoagulation:  Pharmaceutical: continue Lovenox              -antiplatelet therapy: ASA  3. Episodes of blank stares: EEG ordered and is consistent with encephalopathy- no seizures evident  4. Mood/Behavior/Sleep: LCSW to follow for evaluation and support.              -antipsychotic agents: N/A  5. Neuropsych/cognition: This patient may be intermittent  capable of making decisions on his own behalf.  6. Skin/Wound Care: Routine pressure  relief measures  7. Class 2 obesity: provided wife with a list of foods for weight loss  8. PD s/p DBS/Autonomic dysfunction: Monitor for orthostatic symptoms with increase in activity             -continue sinemet  ER five times a day.  --Pimavanserin  daily to  manage hallucinations  Continue metanx 11/30 pt is on a fairly aggressive regimen. Will not adjust any further  -?neuro follow up  9. T2DM: Monitor BS ac/hs, d/c ISS to help get patient on home regimen, provided a list of foods that are good for diabetes             --On Lantus  15 units BId with novolog  4 units TID ac. Started metformin  500mg  daily with breakfast   CBG (last 3)  Recent Labs    12/09/23 1655 12/09/23 2021 12/10/23 0617  GLUCAP 107* 124* 119*    11/30 cbg's appear fairly well controlled--no changes 10. HFrEF: Strict I/O. Daily weights and monitor for signs of overload.              --Low salt diet. Continue ASA, Lasix , Cozaar , Toprol  XL and aldactone . Asked nursing to please weigh him today, lasix  10mg  additional ordered 11/28 for slight increase in weight  11. Glaucoma: Managed with cosopt   12. Hyperactive airway disease: On Breo.   13. Thrush: Continue nystatin  mouth wash  14. AKI: Decrease lasix  to 30mg  daily  15.  Thrombocytopenia: PC reviewed and is stable   16. Urinary retention: d/c mirabegron . Will monitor voiding with PVR checks.   17. Constipation: LBM 11/30, decrease  miralax  to daily, asked nursing to please offer prune juice, magnesium  oxide 200mg  ordered daily  18. Concern for seizures: monitor for signs/symptoms, EEG ordered and shows no evidence of seizures  19. Spasticity/dystonia: patient follows with Dr. Carilyn for botox  outpatient  20. Mild vascular congestion with mild atelectasis on CXR: O2 ordered with goal sat above 96%, discussed with wife that patient has no use of incentive spirometer. Discussed with patient/wife/therapy weaning O2 with therapy, decrease lasix  to 30mg  daily given worsening creatinine, incentive spirometer ordered  LOS: 5 days A FACE TO FACE EVALUATION WAS PERFORMED  Sven P Tanecia Mccay 12/10/2023, 9:50 AM

## 2023-12-10 NOTE — Progress Notes (Signed)
 Occupational Therapy Session Note  Patient Details  Name: Henry [name] MRN: [id number] Date of Birth: [birth year]  Today's Date: 12/10/2023 OT Individual Time: 9051-8954 OT Individual Time Calculation (min): 57 min    Short Term Goals: Week 1:  OT Short Term Goal 1 (Week 1): Pt will complete UB bathing with Mod A. OT Short Term Goal 2 (Week 1): Pt will complete UB dressing with Mod A. OT Short Term Goal 3 (Week 1): Pt will complete step pivot transfer from bed <> WC with Mod Ax1 and RW if needed.   Skilled Therapeutic Interventions/Progress Updates:    Pt received sitting up in the TIS with no c/o pain, agreeable to OT session declining ADLs. Pt was taken via w/c to the therapy gym for time management. He completed a sit > stand with mod A from the w/c, disregarding OT cueing for at least one hand on the w/c and standing with BUE on the RW. He was unable to pivot and returned to sitting. He then completed a full stand pivot with mod A, initially just min A to pivot but froze and was unable to swing his hips the remainder of the way requiring OT intervention. Per nursing request, weighed pt with the standing scale- 251.7. He was able to stand with min A. He then completed high intensity circuit, alternating sit > stand with repetitive forward punch holding a 3lb dumbbell 8x R and L before sitting and completing 4 lb dowel forward press. Pt required use of rest breaks throughout session for recovery, as well as to support safety and prevent overexertion. During breaks, OT monitored recovery time to assess endurance and response to exertion. He reported need to use the bathroom. He pivot back to the w/c with mod A using the RW. He was taken back to the room. Stedy used with min A to transfer to [facility] over toilet . Total A for clothing management in standing and mod A to guide hips down to [facility]. He voided urine. He returned to the w/c via stedy. His wife arrived and assisted him in changing his shirt-  mod A. Pt completed the BUE ergometer to challenge BUE strength and endurance needed to complete ADLs and IADLs with the highest level of independence. Pt completed 5 min with cueing for pacing and technique. Pt was left sitting up in the wheelchair with all needs met, chair alarm set, and call bell within reach.      Therapy Documentation Precautions:  Precautions Precautions: Fall Recall of Precautions/Restrictions: Impaired Precaution/Restrictions Comments: Parkinson's, deep brain stimulator, R ear deaf Restrictions Weight Bearing Restrictions Per Provider Order: No Therapy/Group: Individual Therapy  Nena VEAR Moats 12/10/2023, 8:08 AM

## 2023-12-10 NOTE — Progress Notes (Addendum)
 Speech Language Pathology Daily Session Note  Patient Details  Name: Henry [name] MRN: [id number] Date of Birth: 09/25/1950  Today's Date: 12/10/2023 SLP Individual Time: 1300-1400 SLP Individual Time Calculation (min): 60 min  Short Term Goals: Week 1: SLP Short Term Goal 1 (Week 1): Pt will utilize compensatory speech strategies to maintain 80% intelligibility or greater at word level given modA SLP Short Term Goal 2 (Week 1): Pt will utilize compensatory speech strategies to maintain 60% intelligibility or greater at sentence level given modA SLP Short Term Goal 3 (Week 1): Pt will solve simple problems w/ modA SLP Short Term Goal 4 (Week 1): Pt will sustain attention for ~10 mins w/ modA SLP Short Term Goal 5 (Week 1): Pt will recall recent/relevant info w/ modA SLP Short Term Goal 6 (Week 1): Pt will tolerate regular textures w/ thin liquids w/ no overt s/s of airway invasion 90% of the time or greater  Skilled Therapeutic Interventions:   SLP conducted skilled therapy session targeting communication and cognitive goals. Upon SLP entry, patient sitting in wheelchair with spouse and daughter present. SLP facilitated a mildly complex problem solving card task to target functional problem solving and speech intelligibility. Patient maxA for problem solving and modA for speech intelligibility at sentence(s) level across 5/5 trials. Patient exhibited difficulty with identifying problem for each card however mod to maxA to generate an appropriate solution to fix problem. He benefited from mod verbal cues to overarticulate and pause between utterances as pt was ~50% intelligible during phrases up to conversation. SLP then conducted a sentence unscrambling task where patient verbalized 4 word simple sentences and reconstructed each with appropriate syntax. Patient ~75% intelligible across 6/6 trials requiring mod cues to increase loudness, pause, and overarticulate. Note patient frequently closed  eyes during session requiring mod verbal cues to sustained attention. Patient was left in room with call bell in reach, alarm set, and family present. SLP will continue to target goals per plan of care.     Pain   None endorsed  Therapy/Group: Individual Therapy  Dozier Alken 12/10/2023, 4:49 PM

## 2023-12-10 NOTE — Progress Notes (Signed)
 Physical Therapy Session Note  Patient Details  Name: Henry [name] MRN: [id number] Date of Birth: 1950/04/20  Today's Date: 12/10/2023 PT Individual Time: 9196-9085 PT Individual Time Calculation (min): 71 min   Short Term Goals: Week 1:  PT Short Term Goal 1 (Week 1): pt will transfer supine<>sitting EOB with mod A of 1 PT Short Term Goal 2 (Week 1): pt will transfer bed<>chair with LRAD and max A of 1 PT Short Term Goal 3 (Week 1): pt will transfer sit<>stand with max A of 1  Skilled Therapeutic Interventions/Progress Updates:     Pt received supine in bed asleep. Awakens slowly to verbal stimuli and agrees to therapy. No complaint of pain. Pt performs bridge in hooklying with cues for correct performance to assist with donning pants. Supine to sit with maxA and cues for logrolling, sequencing, and trunk control. Pt set up for breakfast in short sitting on EOB to work on balance, core strengthening, and activity tolerance.   Pt requires modA to achieve short sitting position with both feet on ground for lower extremity support. PT provides min/modA for utensil management to assist with feeding. Pt then completes teeth brushing at sink to work on coordination and ADL training. WC transport to gym.  Pt performs sit to stand with modA and ambulates x35' with modA and RW, with cues for upright posture to improve balance, as well as increasing proximity to RW for safety. Pt requires increased assistance to manage RW when transitioning back to [facility], with cues for hand placement and body position.   WC transport back to room. Left seated in Tilt in space WC with social worker present.   Therapy Documentation Precautions:  Precautions Precautions: Fall Recall of Precautions/Restrictions: Impaired Precaution/Restrictions Comments: Parkinson's, deep brain stimulator, R ear deaf Restrictions Weight Bearing Restrictions Per Provider Order: No    Therapy/Group: Individual Therapy  Elsie [name], PT, DPT 12/10/2023, 4:19 PM

## 2023-12-11 LAB — GLUCOSE, CAPILLARY
Glucose-Capillary: 123 mg/dL — ABNORMAL HIGH (ref 70–99)
Glucose-Capillary: 144 mg/dL — ABNORMAL HIGH (ref 70–99)
Glucose-Capillary: 122 mg/dL — ABNORMAL HIGH (ref 70–99)
Glucose-Capillary: 157 mg/dL — ABNORMAL HIGH (ref 70–99)

## 2023-12-11 LAB — BASIC METABOLIC PANEL WITH GFR
Anion gap: 9 (ref 5–15)
BUN: 28 mg/dL — ABNORMAL HIGH (ref 8–23)
CO2: 37 mmol/L — ABNORMAL HIGH (ref 22–32)
Calcium: 8.8 mg/dL — ABNORMAL LOW (ref 8.9–10.3)
Chloride: 93 mmol/L — ABNORMAL LOW (ref 98–111)
Creatinine, Ser: 1.21 mg/dL (ref 0.61–1.24)
GFR, Estimated: >60 mL/min (ref >60)
Glucose, Bld: 120 mg/dL — ABNORMAL HIGH (ref 70–99)
Potassium: 3.8 mmol/L (ref 3.5–5.1)
Sodium: 139 mmol/L (ref 135–145)

## 2023-12-11 MED ORDER — AMANTADINE HCL 100 MG PO CAPS
100.0000 mg | ORAL_CAPSULE | Freq: Every day | ORAL | Status: DC
  Administered 2023-12-11 – 2023-12-13 (×3): 100 mg via ORAL
  Filled 2023-12-11 (×3): qty 1

## 2023-12-11 MED ORDER — INSULIN ASPART 100 UNIT/ML IJ SOLN
3.0000 [IU] | Freq: Three times a day (TID) | INTRAMUSCULAR | Status: DC
  Administered 2023-12-11 – 2023-12-14 (×8): 3 [IU] via SUBCUTANEOUS
  Filled 2023-12-11 (×7): qty 3

## 2023-12-11 NOTE — Progress Notes (Signed)
 Physical Therapy Session Note  Patient Details  Name: Henry [name] MRN: [id number] Date of Birth: 22-Mar-1950  Today's Date: 12/11/2023 PT Individual Time: 9153-9044 PT Individual Time Calculation (min): 69 min   Short Term Goals: Week 1:  PT Short Term Goal 1 (Week 1): pt will transfer supine<>sitting EOB with mod A of 1 PT Short Term Goal 2 (Week 1): pt will transfer bed<>chair with LRAD and max A of 1 PT Short Term Goal 3 (Week 1): pt will transfer sit<>stand with max A of 1  Skilled Therapeutic Interventions/Progress Updates:   Received pt semi-reclined in bed sleeping hard with RN at bedside. Pt required ++ time and max verbal and tactile stimuli to arouse (cold washcloth and very strong sternal rub). Pt on 3L O2 - titrated to RA and SPO2 93% at rest. Pt agreeable to PT treatment and denied any pain during session. Pt verbalized not doing good but unable to elaborate - later stating I can't and my legs are too weak with encouragement for mobility. Session with emphasis on functional mobility/transfers, dressing, generalized strengthening and endurance, dynamic standing balance/coordination, NMR, and gait training. Pt found to be incontinent of urine - rolled L/R with +2 assist and removed soiled brief, performed hygiene management, and donned clean brief and pants with +2 assist. Pt transferred semi-reclined<>sitting R EOB with HOB elevated and heavy max A (pt initiating and assisting very little). Required assist to pivot hips around into short sitting EOB. Once awake, pt able to maintain static sitting balance with close supervision while RN administered medications. Donned socks and shoes with max A and required x3 attempts and max A (+2 providing min A) to stand from elevated EOB with RW - noted pt with retropulsion with difficulty correcting. Performed stand<>pivot into TIS WC with RW and mod A with max cues for sequencing and safety when turning. MD arrived for morning rounds and  therapist set pt up to eat breakfast. Pt ate 75% of breakfast with setup assist.  Removed dirty shirt and donned clean one with max A. Pt transported to/from room in [facility] dependently for time management purposes. Stood with RW and heavy mod A (cues for hand and foot placement) and ambulated 60ft with RW and min A (+2 for WC follow). Pt ambulates with festinating gait pattern, forward head/downward gaze, decreased foot clearance and with poor carry over with cues despite using mirror for visual feedback. Returned to room and concluded session with pt sitting in TIS WC, needs within reach, and seatbelt alarm on.   Therapy Documentation Precautions:  Precautions Precautions: Fall Recall of Precautions/Restrictions: Impaired Precaution/Restrictions Comments: Parkinson's, deep brain stimulator, R ear deaf Restrictions Weight Bearing Restrictions Per Provider Order: No  Therapy/Group: Individual Therapy [name] [name] PT, DPT 12/11/2023, 6:56 AM

## 2023-12-11 NOTE — Progress Notes (Signed)
 Occupational Therapy Session Note  Patient Details  Name: Henry [name] MRN: [id number] Date of Birth: 22-Nov-1950  Today's Date: 12/11/2023 OT Individual Time: 1420-1530 OT Individual Time Calculation (min): 70 min    Short Term Goals: Week 1:  OT Short Term Goal 1 (Week 1): Pt will complete UB bathing with Mod A. OT Short Term Goal 2 (Week 1): Pt will complete UB dressing with Mod A. OT Short Term Goal 3 (Week 1): Pt will complete step pivot transfer from bed <> WC with Mod Ax1 and RW if needed.  Skilled Therapeutic Interventions/Progress Updates:  Skilled OT session completed to address dynamic standing balance, ADL retraining, and DME education. Pt received seated in TIS [facility] with wife present, agreeable to participate in therapy. Pt reports no pain.   Pt dependently propelled to ortho gym. Pt completed 5x STS at RW with Min A to reach standing, OT provided mirror for visual feedback and tactile cues at chest and hip to facilitate upright posture. Pt displays posterior pelvic tilt and trunk flexion during activity when standing. OT upgraded task to reach overhead for squigz 3x to incorporate dynamic standing balance as it relates to functional activity. During task pt able to reach neutral standing position; however, displaying increased fatigue and requiring several seated rest breaks to complete task. Pt required VC throughout STS activities for RW safety and performing controlled sitting. Pt reporting incontinence of urine during activity. Pt dependently propelled back to room to complete toileting hygiene. Pt completed STS at [facility] with Mod A d/t fatigue. Pt instructed on removing pants in standing with 1 BUE supported on RW for balance, pt completes with CGA. OT dependently removed brief. Pt performs peri care standing at RW with CGA; VC for task initiation and safety. Pt pulls pants up with Min A to readjust at bottom. Overall toileting hygiene with Mod A. Pt seated in TIS WC. OT provided DME  catalog to present seating options for bathing and provided education on benefits of shower chair, 3n1 commode, and TTB. Pt and wife verbalized understanding. Pt seated in [facility] with chair alarm on and all needs within reach.  Therapy Documentation Precautions:  Precautions Precautions: Fall Recall of Precautions/Restrictions: Impaired Precaution/Restrictions Comments: Parkinson's, deep brain stimulator, R ear deaf Restrictions Weight Bearing Restrictions Per Provider Order: No   Therapy/Group: Individual Therapy  Tyshauna Finkbiner Woods-Chance, MS, OTR/L 12/11/2023, 7:53 AM

## 2023-12-11 NOTE — Plan of Care (Signed)
  Problem: Consults Goal: RH GENERAL PATIENT EDUCATION Description: See Patient Education module for education specifics. Outcome: Progressing   Problem: RH BOWEL ELIMINATION Goal: RH STG MANAGE BOWEL WITH ASSISTANCE Description: STG Manage Bowel with supervision  Assistance. Outcome: Progressing   Problem: RH BLADDER ELIMINATION Goal: RH STG MANAGE BLADDER WITH ASSISTANCE Description: STG Manage Bladder With supervision  Assistance Outcome: Progressing   Problem: RH SKIN INTEGRITY Goal: RH STG SKIN FREE OF INFECTION/BREAKDOWN Description: Manage skin free of infection with supervision assistance Outcome: Progressing   Problem: RH SAFETY Goal: RH STG ADHERE TO SAFETY PRECAUTIONS W/ASSISTANCE/DEVICE Description: STG Adhere to Safety Precautions With supervision Assistance/Device. Outcome: Progressing   Problem: RH PAIN MANAGEMENT Goal: RH STG PAIN MANAGED AT OR BELOW PT'S PAIN GOAL Description: <4 w/ prns Outcome: Progressing   Problem: RH KNOWLEDGE DEFICIT GENERAL Goal: RH STG INCREASE KNOWLEDGE OF SELF CARE AFTER HOSPITALIZATION Description: Manage increase knowledge of self care after hospitalization with supervision assistance from family using educational materials provided Outcome: Progressing

## 2023-12-11 NOTE — Telephone Encounter (Signed)
 Patient is admitted at this time. Looks like med was paused with no restart date.

## 2023-12-11 NOTE — Progress Notes (Signed)
 PROGRESS NOTE   Subjective/Complaints: No new complaints this morning Off O2 Continues to feel stiff and weak in the mornings  ROS: +diffuse weakness  Objective:   No results found.  Recent Labs    12/10/23 0502  WBC 7.2  HGB 13.3  HCT 42.1  PLT 139*   Recent Labs    12/10/23 0502 12/11/23 0821  NA 138 139  K 3.6 3.8  CL 93* 93*  CO2 37* 37*  GLUCOSE 128* 120*  BUN 24* 28*  CREATININE 1.31* 1.21  CALCIUM  8.7* 8.8*    Intake/Output Summary (Last 24 hours) at 12/11/2023 1319 Last data filed at 12/11/2023 0800 Gross per 24 hour  Intake 806 ml  Output --  Net 806 ml        Physical Exam: Vital Signs Blood pressure (!) 141/70, pulse 73, temperature 98 F (36.7 C), temperature source Oral, resp. rate 18, height 5' 9 (1.753 m), weight 113.1 kg, SpO2 96%. Constitutional: No distress . Vital signs reviewed. obese HEENT: NCAT, EOMI, oral membranes moist Neck: supple Cardiovascular: RRR without murmur. No JVD    Respiratory/Chest: CTA Bilaterally without wheezes or rales. Normal effort. O2 Kinder   GI/Abdomen: BS +, non-tender, non-distended Ext: no clubbing, cyanosis, or edema Psych: flat but cooperative  Skin: Bruising noted on bilateral upper extremities Neuro:     Mental Status: AAOx4, memory deficits present, intermittant confusion, delayed responses, able to provide his date of birth Speech/Languate: Naming and repetition intact,   severe dysarthria this morning, follows simple commands. Pill rolling tremor bilaterally CRANIAL NERVES: II: PERRL. Visual fields full III, IV, VI: EOM intact, no gaze preference or deviation V: normal sensation bilaterally VII: no asymmetry VIII: normal hearing to speech IX, X: normal palatal elevation XI: head turn intact b/l XII: Tongue midline     MOTOR: Pt moves BUE and BLE with 2/5 to 3/5, struggles to initiate, stable 12/2   SENSORY: Normal to touch all 4  extremities   persistent rigidity in b/l legs Right greater than left         Assessment/Plan: 1. Functional deficits which require 3+ hours per day of interdisciplinary therapy in a comprehensive inpatient rehab setting. Physiatrist is providing close team supervision and 24 hour management of active medical problems listed below. Physiatrist and rehab team continue to assess barriers to discharge/monitor patient progress toward functional and medical goals  Care Tool:  Bathing    Body parts bathed by patient: Face, Right arm, Left arm, Chest   Body parts bathed by helper: Abdomen, Front perineal area, Buttocks, Right upper leg, Left upper leg, Right lower leg, Left lower leg     Bathing assist Assist Level: Maximal Assistance - Patient 24 - 49%     Upper Body Dressing/Undressing Upper body dressing   What is the patient wearing?: Pull over shirt    Upper body assist Assist Level: Maximal Assistance - Patient 25 - 49%    Lower Body Dressing/Undressing Lower body dressing      What is the patient wearing?: Pants, Incontinence brief     Lower body assist Assist for lower body dressing: Dependent - Patient 0%     Toileting Toileting  Toileting assist Assist for toileting: Dependent - Patient 0%     Transfers Chair/bed transfer  Transfers assist     Chair/bed transfer assist level: Moderate Assistance - Patient 50 - 74%     Locomotion Ambulation   Ambulation assist   Ambulation activity did not occur: Safety/medical concerns (decresed responsiveness, weakness, decreased balance, unable to respond to simple 1 step commands)  Assist level: 2 helpers Assistive device: Walker-rolling Max distance: 67ft   Walk 10 feet activity   Assist  Walk 10 feet activity did not occur: Safety/medical concerns (decresed responsiveness, weakness, decreased balance, unable to respond to simple 1 step commands)  Assist level: 2 helpers Assistive device:  Walker-rolling   Walk 50 feet activity   Assist Walk 50 feet with 2 turns activity did not occur: Safety/medical concerns (decresed responsiveness, weakness, decreased balance, unable to respond to simple 1 step commands)         Walk 150 feet activity   Assist Walk 150 feet activity did not occur: Safety/medical concerns (decresed responsiveness, weakness, decreased balance, unable to respond to simple 1 step commands)         Walk 10 feet on uneven surface  activity   Assist Walk 10 feet on uneven surfaces activity did not occur: Safety/medical concerns (decresed responsiveness, weakness, decreased balance, unable to respond to simple 1 step commands)         Wheelchair     Assist Is the patient using a wheelchair?: Yes Type of Wheelchair: Manual (TIS WC)    Wheelchair assist level: Dependent - Patient 0% Max wheelchair distance: >339ft    Wheelchair 50 feet with 2 turns activity    Assist        Assist Level: Dependent - Patient 0%   Wheelchair 150 feet activity     Assist      Assist Level: Dependent - Patient 0%   Blood pressure (!) 141/70, pulse 73, temperature 98 F (36.7 C), temperature source Oral, resp. rate 18, height 5' 9 (1.753 m), weight 113.1 kg, SpO2 96%.  Medical Problem List and Plan: 1. Functional deficits secondary to generalized weakness due to parkinson disease. Pt has DBS.              -patient may shower             -ELOS/Goals: 14-16, PT sup to CGA, OT mon/mod A, SLP sup to min A             11/30 -pt with worsening dysarthria and weakness this past week. Both HCT and EEG reviewed. No acute changes on CT and EEG demonstrates diffuse slowing. Neurology did not recommend any changes yesterday.    - I don't see any infectious, pharmaceutical, or metabolic reasons for presentation. Labs were ok yesterday   -likely progression of PD unfortunately   -continue to monitor, encourage OOB activity   -perhaps follow up with  neurology this week 2.  Antithrombotics: -DVT/anticoagulation:  Pharmaceutical: continue Lovenox              -antiplatelet therapy: ASA  3. Episodes of blank stares: EEG ordered and is consistent with encephalopathy- no seizures evident  4. Mood/Behavior/Sleep: LCSW to follow for evaluation and support.              -antipsychotic agents: N/A  5. Neuropsych/cognition: This patient may be intermittent  capable of making decisions on his own behalf.  6. Skin/Wound Care: Routine pressure relief measures  7. Class 2 obesity: provided wife with  a list of foods for weight loss, continue magnesium  supplement  8. PD s/p DBS/Autonomic dysfunction: Monitor for orthostatic symptoms with increase in activity             -continue sinemet  ER five times a day.  --Pimavanserin  daily to  manage hallucinations  Continue metanx Amantadine 100mg  daily started 9. T2DM: Monitor BS ac/hs, d/c ISS to help get patient on home regimen, provided a list of foods that are good for diabetes             --On Lantus  15 units Bid. Started metformin  500mg  daily with breakfast, decrease aspart to 3U TID  CBG (last 3)  Recent Labs    12/10/23 2025 12/11/23 0601 12/11/23 1144  GLUCAP 129* 123* 144*    Decrease CBGs to AC 10. HFrEF: Strict I/O. Daily weights and monitor for signs of overload.              --Low salt diet. Continue ASA, Lasix , Cozaar , Toprol  XL and aldactone . Asked nursing to please weigh him today, lasix  10mg  additional ordered 11/28 for slight increase in weight  11. Glaucoma: Managed with cosopt   12. Hyperactive airway disease: On Breo.   13. Thrush: Continue nystatin  mouth wash  14. AKI: Decrease lasix  to 30mg  daily, resolved, asked nursing to please weigh patient, daily weights ordered  15.  Thrombocytopenia: PC reviewed and is stable   16. Urinary retention: d/c mirabegron . Will monitor voiding with PVR checks.   17. Constipation: LBM 12/1, d/c miralax , asked nursing to please offer  prune juice, continue magnesium  oxide 200mg  daily  18. Concern for seizures: monitor for signs/symptoms, EEG ordered and shows no evidence of seizures  19. Spasticity/dystonia: patient follows with Dr. Carilyn for botox  outpatient  20. Mild vascular congestion with mild atelectasis on CXR: weaned off O2 during the day time, encouraged use of incentive spirometer. Discussed with patient/wife/therapy weaning O2 with therapy, decrease lasix  to 30mg  daily given worsening creatinine, incentive spirometer ordered  LOS: 6 days A FACE TO FACE EVALUATION WAS PERFORMED  Marymargaret Kirker P Maram Bently 12/11/2023, 1:19 PM

## 2023-12-11 NOTE — Progress Notes (Signed)
 Speech Language Pathology Daily Session Note  Patient Details  Name: Henry [name] MRN: [id number] Date of Birth: 08-30-1950  Today's Date: 12/11/2023 SLP Individual Time: 1305-1400 SLP Individual Time Calculation (min): 55 min  Short Term Goals: Week 1: SLP Short Term Goal 1 (Week 1): Pt will utilize compensatory speech strategies to maintain 80% intelligibility or greater at word level given modA SLP Short Term Goal 2 (Week 1): Pt will utilize compensatory speech strategies to maintain 60% intelligibility or greater at sentence level given modA SLP Short Term Goal 3 (Week 1): Pt will solve simple problems w/ modA SLP Short Term Goal 4 (Week 1): Pt will sustain attention for ~10 mins w/ modA SLP Short Term Goal 5 (Week 1): Pt will recall recent/relevant info w/ modA SLP Short Term Goal 6 (Week 1): Pt will tolerate regular textures w/ thin liquids w/ no overt s/s of airway invasion 90% of the time or greater  Skilled Therapeutic Interventions: SLP conducted skilled therapy session targeting communication and cognitive goals. SLP facilitated a mildly complex task where patient verbalized illogical sentences then restated sentence with appropriate semantics. Patient ~75% intelligible given context and ~40% intelligible without context requiring mod cues to overarticulate, pause, and increase volume across 2 sets of 14 sentences. Patient modA to identify replacement word for sentences. SLP then conducted a mildly complex task where patient generated 3-5 words then produced simple sentences containing word list. Patient ~60% intelligible at word level and ~40% intelligible at sentence level benefiting from cues to overarticulate and increase volume across 7/7 trials. MinA given for divergent naming as he had difficulty naming items with specific initial letters. Patient was left in room with call bell in reach and alarm set. SLP will continue to target goals per plan of care.        Pain Pain  Assessment Pain Scale: 0-10 Pain Score: 0-No pain  Therapy/Group: Individual Therapy  Ashley A Ellin 12/11/2023, 2:05 PM

## 2023-12-12 ENCOUNTER — Telehealth: Payer: Self-pay | Admitting: Neurology

## 2023-12-12 ENCOUNTER — Other Ambulatory Visit: Payer: Self-pay | Admitting: Neurology

## 2023-12-12 DIAGNOSIS — R443 Hallucinations, unspecified: Secondary | ICD-10-CM

## 2023-12-12 DIAGNOSIS — G20A1 Parkinson's disease without dyskinesia, without mention of fluctuations: Secondary | ICD-10-CM

## 2023-12-12 LAB — GLUCOSE, CAPILLARY
Glucose-Capillary: 159 mg/dL — ABNORMAL HIGH (ref 70–99)
Glucose-Capillary: 154 mg/dL — ABNORMAL HIGH (ref 70–99)
Glucose-Capillary: 151 mg/dL — ABNORMAL HIGH (ref 70–99)
Glucose-Capillary: 140 mg/dL — ABNORMAL HIGH (ref 70–99)

## 2023-12-12 MED ORDER — TAMSULOSIN HCL 0.4 MG PO CAPS
0.4000 mg | ORAL_CAPSULE | Freq: Every day | ORAL | Status: DC
  Administered 2023-12-12 – 2023-12-16 (×5): 0.4 mg via ORAL
  Filled 2023-12-12 (×5): qty 1

## 2023-12-12 MED ORDER — MAGNESIUM OXIDE -MG SUPPLEMENT 400 (240 MG) MG PO TABS
400.0000 mg | ORAL_TABLET | Freq: Every day | ORAL | Status: DC
  Administered 2023-12-13 – 2023-12-24 (×12): 400 mg via ORAL
  Filled 2023-12-12 (×12): qty 1

## 2023-12-12 NOTE — Telephone Encounter (Signed)
 Pt wife called to cancel Henry [name] appt due to him still being in hospital. He is being released 12/28/23. She will wait till he gets out and wait for Dr.Tat to reschedule him for the movement appt

## 2023-12-12 NOTE — Progress Notes (Signed)
 PROGRESS NOTE   Subjective/Complaints: Patient's chart reviewed- No issues reported overnight Vitals signs stable Denies pain   ROS: +diffuse weakness, denies pain, +urinary urgency  Objective:   No results found.  Recent Labs    12/10/23 0502  WBC 7.2  HGB 13.3  HCT 42.1  PLT 139*   Recent Labs    12/10/23 0502 12/11/23 0821  NA 138 139  K 3.6 3.8  CL 93* 93*  CO2 37* 37*  GLUCOSE 128* 120*  BUN 24* 28*  CREATININE 1.31* 1.21  CALCIUM  8.7* 8.8*   No intake or output data in the 24 hours ending 12/12/23 0900       Physical Exam: Vital Signs Blood pressure 119/78, pulse 68, temperature 97.7 F (36.5 C), temperature source Oral, resp. rate 18, height 5' 9 (1.753 m), weight 114.7 kg, SpO2 92%. Constitutional: No distress . Vital signs reviewed. obese HEENT: NCAT, EOMI, oral membranes moist Neck: supple Cardiovascular: RRR without murmur. No JVD    Respiratory/Chest: CTA Bilaterally without wheezes or rales. Normal effort. O2 [place]   GI/Abdomen: BS +, non-tender, non-distended Ext: no clubbing, cyanosis, or edema Psych: flat but cooperative  Skin: Bruising noted on bilateral upper extremities Neuro:     Mental Status: AAOx4, memory deficits present, intermittant confusion, delayed responses, able to provide his date of birth Speech/Languate: Naming and repetition intact,   severe dysarthria this morning, follows simple commands. Pill rolling tremor bilaterally CRANIAL NERVES: II: PERRL. Visual fields full III, IV, VI: EOM intact, no gaze preference or deviation V: normal sensation bilaterally VII: no asymmetry VIII: normal hearing to speech IX, X: normal palatal elevation XI: head turn intact b/l XII: Tongue midline     MOTOR: Pt moves BUE and BLE with 3/5, struggles to initiate, improved as above 12/3   SENSORY: Normal to touch all 4 extremities   persistent rigidity in b/l legs Right greater  than left         Assessment/Plan: 1. Functional deficits which require 3+ hours per day of interdisciplinary therapy in a comprehensive inpatient rehab setting. Physiatrist is providing close team supervision and 24 hour management of active medical problems listed below. Physiatrist and rehab team continue to assess barriers to discharge/monitor patient progress toward functional and medical goals  Care Tool:  Bathing    Body parts bathed by patient: Face, Right arm, Left arm, Chest   Body parts bathed by helper: Abdomen, Front perineal area, Buttocks, Right upper leg, Left upper leg, Right lower leg, Left lower leg     Bathing assist Assist Level: Maximal Assistance - Patient 24 - 49%     Upper Body Dressing/Undressing Upper body dressing   What is the patient wearing?: Pull over shirt    Upper body assist Assist Level: Maximal Assistance - Patient 25 - 49%    Lower Body Dressing/Undressing Lower body dressing      What is the patient wearing?: Pants, Incontinence brief     Lower body assist Assist for lower body dressing: Dependent - Patient 0%     Toileting Toileting    Toileting assist Assist for toileting: Dependent - Patient 0%     Transfers Chair/bed transfer  Transfers assist     Chair/bed transfer assist level: Moderate Assistance - Patient 50 - 74%     Locomotion Ambulation   Ambulation assist   Ambulation activity did not occur: Safety/medical concerns (decresed responsiveness, weakness, decreased balance, unable to respond to simple 1 step commands)  Assist level: 2 helpers Assistive device: Walker-rolling Max distance: 55ft   Walk 10 feet activity   Assist  Walk 10 feet activity did not occur: Safety/medical concerns (decresed responsiveness, weakness, decreased balance, unable to respond to simple 1 step commands)  Assist level: 2 helpers Assistive device: Walker-rolling   Walk 50 feet activity   Assist Walk 50 feet with 2  turns activity did not occur: Safety/medical concerns (decresed responsiveness, weakness, decreased balance, unable to respond to simple 1 step commands)         Walk 150 feet activity   Assist Walk 150 feet activity did not occur: Safety/medical concerns (decresed responsiveness, weakness, decreased balance, unable to respond to simple 1 step commands)         Walk 10 feet on uneven surface  activity   Assist Walk 10 feet on uneven surfaces activity did not occur: Safety/medical concerns (decresed responsiveness, weakness, decreased balance, unable to respond to simple 1 step commands)         Wheelchair     Assist Is the patient using a wheelchair?: Yes Type of Wheelchair: Manual (TIS WC)    Wheelchair assist level: Dependent - Patient 0% Max wheelchair distance: >356ft    Wheelchair 50 feet with 2 turns activity    Assist        Assist Level: Dependent - Patient 0%   Wheelchair 150 feet activity     Assist      Assist Level: Dependent - Patient 0%   Blood pressure 119/78, pulse 68, temperature 97.7 F (36.5 C), temperature source Oral, resp. rate 18, height 5' 9 (1.753 m), weight 114.7 kg, SpO2 92%.  Medical Problem List and Plan: 1. Functional deficits secondary to generalized weakness due to parkinson disease. Pt has DBS.              -patient may shower             -ELOS/Goals: 14-16, PT sup to CGA, OT mon/mod A, SLP sup to min A             11/30 -pt with worsening dysarthria and weakness this past week. Both HCT and EEG reviewed. No acute changes on CT and EEG demonstrates diffuse slowing. Neurology did not recommend any changes yesterday.    - I don't see any infectious, pharmaceutical, or metabolic reasons for presentation. Labs were ok yesterday   -likely progression of PD unfortunately   -continue to monitor, encourage OOB activity   -perhaps follow up with neurology this week 2.  Antithrombotics: -DVT/anticoagulation:   Pharmaceutical: continue Lovenox              -antiplatelet therapy: ASA  3. Episodes of blank stares: EEG ordered and is consistent with encephalopathy- no seizures evident  4. Mood/Behavior/Sleep: LCSW to follow for evaluation and support.              -antipsychotic agents: N/A  5. Neuropsych/cognition: This patient may be intermittent  capable of making decisions on his own behalf.  6. Skin/Wound Care: Routine pressure relief measures  7. Class 2 obesity: provided wife with a list of foods for weight loss, continue magnesium  supplement  8. PD s/p DBS/Autonomic dysfunction: Monitor for  orthostatic symptoms with increase in activity             -continue sinemet  ER five times a day.  --Pimavanserin  daily to  manage hallucinations  continue metanx Amantadine 100mg  daily started  9. T2DM: Monitor BS ac/hs, d/c ISS to help get patient on home regimen, provided a list of foods that are good for diabetes             --contiunue Lantus  15 units Bid. Started metformin  500mg  daily with breakfast, decrease aspart to 3U TID  CBG (last 3)  Recent Labs    12/11/23 1708 12/11/23 1951 12/12/23 0547  GLUCAP 122* 157* 159*    Decrease CBGs to AC 10. HFrEF: Strict I/O. Daily weights and monitor for signs of overload.              --Low salt diet. Continue ASA, Lasix , Cozaar , Toprol  XL and aldactone . Asked nursing to please weigh him today, lasix  10mg  additional ordered 11/28 for slight increase in weight  11. Glaucoma: Managed with cosopt   12. Hyperactive airway disease: continue Breo.   13. Thrush: Continue nystatin  mouth wash  14. AKI: Decrease lasix  to 30mg  daily, resolved, asked nursing to please weigh patient, daily weights ordered, recheck Cr tomorrow  15.  Thrombocytopenia: PC reviewed and is stable   16. Urinary retention: d/c mirabegron . Will monitor voiding with PVR checks. Flomax started after supper  17. Constipation: LBM 12/1- messaged nursing to confirm accuracy. d/c  miralax , asked nursing to please offer prune juice, continue magnesium  oxide 200mg  daily  18. Concern for seizures: monitor for signs/symptoms, EEG ordered and shows no evidence of seizures  19. Spasticity/dystonia: patient follows with Dr. Carilyn for botox  outpatient  20. Mild vascular congestion with mild atelectasis on CXR: weaned off O2 during the day time, encouraged use of incentive spirometer. Discussed with patient/wife/therapy weaning O2 with therapy, decrease lasix  to 30mg  daily given worsening creatinine, incentive spirometer ordered, weight reviewed and is stable  LOS: 7 days A FACE TO FACE EVALUATION WAS PERFORMED  Henry [name] P Victora Irby 12/12/2023, 9:00 AM

## 2023-12-12 NOTE — Consult Note (Signed)
 Neuropsychological Consultation Comprehensive Inpatient Rehab   Patient:   Henry [name]   DOB:   07-19-1950  MR Number:  [id number]  Location:  [facility] [address] [place] [address] Dept: [phone] Loc: [phone]           Date of Service:   12/12/2023  Start Time:   3 PM End Time:   4 PM  Provider/Observer:  Norleen Asa, Psy.D.       Clinical Neuropsychologist       Billing Code/Service: [phone]  Reason for Service:    Henry [name] is a 73 year old male referred for neuropsychological consultation during his ongoing admission to the comprehensive inpatient rehabilitation service. Henry [name] has a history of Parkinson's disease status post deep brain stimulator placement, autonomic dysfunction, type 2 diabetes mellitus, chronic systolic and diastolic congestive heart failure, ischemic cardiomyopathy with chronic systolic/diastolic heart failure, paroxysmal atrial fibrillation (not on anticoagulation due to falls), obstructive sleep apnea, and being hard of hearing. He has a history of myocardial infarction and recent episodes of staring with poor responsiveness, for which his DBS was adjusted. He was admitted on 11/27/2023 with reports of staring episodes, bilateral lower extremity edema, decreased level of consciousness, leaning to the right, left-sided weakness, and difficulty walking. Bilateral lower extremity dopplers were negative for deep vein thrombosis. Repeat head CT scans were negative for acute changes. An LT-EEG showed mild generalized dysfunction without epileptiform discharges or seizures. Blood pressure medications were held but Lasix  was resumed due to shortness of breath with evidence of fluid overload. A CT of the abdomen/pelvis, ordered for abdominal pain, showed new consolidation of the right middle lobe and bilateral lower extremities, chronic cholelithiasis, and a redundant  large bowel with retained stool; laxatives were added. Cardiology was consulted on 12/03/2023 and recommended continuing oral Lasix  and resuming a beta-blocker with close blood pressure monitoring due to autonomic dysfunction. The bilateral lower extremity edema has resolved. He has had issues with fatigue and weakness. His wife reported hallucinations with agitation the night prior to this consultation. He was noted to have urinary retention today. Constipation has improved with laxatives. Physical and occupational therapy have been working with the patient, who requires supervision to moderate assistance with ADLs and moderate to maximal assistance for pre-gait activities. A referral for comprehensive inpatient rehabilitation was recommended due to functional decline. This neuropsychological consultation was requested for coping and adjustment during his admission to the CIR unit.  Neuroimaging Results: Repeat CT scans of the head were negative for any acute process suggestive of a stroke. Impression noted no acute intracranial abnormality, age-related cerebral volume loss, and previously demonstrated deep brain stimulators. It also noted a status post right mastoidectomy.  Behavioral Observation/Mental Status: The patient is a 73 year old male who presented as tired. His speech was hypophonic with some evidence of dysarthria. Communication of his needs appeared difficult. He reported not coping well with the prolonged hospitalization. Thought process was difficult to fully assess but appeared disorganized at times. He was oriented to person but orientation to place, time, and situation was not fully established. Judgment and insight appear limited. Mood was dysphoric, and affect was constricted and flat.  Diagnosis:                                    The patient is currently considered to have had an exacerbation  of his long-standing Parkinson's disease with significant deconditioning.   Electronically  Signed   _______________________ Norleen Asa, Psy.D. Clinical Neuropsychologist

## 2023-12-12 NOTE — Progress Notes (Signed)
 Occupational Therapy Note  Patient Details  Name: TKAI SERFASS MRN: [id number] Date of Birth: October 06, 1950  Occupational Therapist participated in the interdisciplinary team conference, providing clinical information regarding the patient's current status, treatment goals, and weekly focus, including any barriers that need to be addressed. Please see the Inpatient Rehabilitation Team Conference and Plan of Care Update for further details.       Brach Birdsall Woods-Chance, MS, OTR/L 12/12/2023, 6:29 PM

## 2023-12-12 NOTE — Progress Notes (Signed)
 Physical Therapy Note  Patient Details  Name: Henry [name] MRN: [id number] Date of Birth: [birth year] Today's Date: 12/12/2023  Physical Therapist participated in the interdisciplinary team conference, providing clinical information regarding the patient's current status, treatment goals, and weekly focus, including any barriers that need to be addressed. Please see the Inpatient Rehabilitation Team Conference and Plan of Care Update for further details.   [name] [name] PT, DPT 12/12/2023, 11:34 AM

## 2023-12-12 NOTE — Telephone Encounter (Signed)
 I denied this in the meantime. Please have family check with inpatient team about refill when nearing discharge.  Thanks.

## 2023-12-12 NOTE — Progress Notes (Signed)
 Patient ID: Henry [name], male   DOB: [birth year], 73 y.o.   MRN: [id number]  Have reviewed team conference with pt and family. Both aware and agreeable with targeted d/c date of 12/19 and goals of Contact Guard/Touching assist.

## 2023-12-12 NOTE — Progress Notes (Signed)
 Occupational Therapy Session Note  Patient Details  Name: Henry [name] MRN: [id number] Date of Birth: [birth year]  Today's Date: 12/12/2023 OT Individual Time: 1300-1406 OT Individual Time Calculation (min): 66 min    Short Term Goals: Week 1:  OT Short Term Goal 1 (Week 1): Pt will complete UB bathing with Mod A. OT Short Term Goal 2 (Week 1): Pt will complete UB dressing with Mod A. OT Short Term Goal 3 (Week 1): Pt will complete step pivot transfer from bed <> WC with Mod Ax1 and RW if needed.  Skilled Therapeutic Interventions/Progress Updates:  Skilled OT session completed to address functional transfers and BUE strengthening. Pt received seated in TIS [place] with wife present, agreeable to participate in therapy. Pt reports no pain.  Pt and wife reporting difficulty with STS when toileting and navigating bathroom with RW. Pt dependently propelled to ortho gym. TIS WC>EOM functional mobility with Min A for RW mgmt. Pt completed 2x R turns and 2x L turns with RW to increase functional mobility and RW safety when performing functional transfers. Pt requiring Mod A to reach standing each trial and Min A for RW mgmt during mobility festinating gait pattern noted. Pt displaying catatonic state during final turn and requiring Max A to discontinue task to reduce risk for falls. Pt reporting fatigue, seated rest break provided.   Pt completed 15x2 seated marches to facilitate increase in step size to reduce risk for falls, pt requested to perform standing, Pt completed 15 marches standing at RW with Min A for RW safety, VC, and tactile cues to decrease trunk flexion. Pt completed the following UB exs to increase strength and independence with ADLs.   5x4 chest press with 1lb dowel 5x4 shoulder press with 1lb dowel with Min A for technique/positioning 5x4 bicep curls with 1lb dowel OT provided visual feedback with mirror for positioning.   EOM>WC with RW Mod A to safely reach sitting d/t  fatigue. Pt returned to room seated in TIS [facility] with chair alarm on and all needs within reach.   Therapy Documentation Precautions:  Precautions Precautions: Fall Recall of Precautions/Restrictions: Impaired Precaution/Restrictions Comments: Parkinson's, deep brain stimulator, R ear deaf Restrictions Weight Bearing Restrictions Per Provider Order: No   Therapy/Group: Individual Therapy Elion Hocker Woods-Chance, MS, OTR/L 12/12/2023, 6:00 PM

## 2023-12-12 NOTE — Progress Notes (Signed)
 Speech Language Pathology Daily Session Note  Patient Details  Name: Henry [name] MRN: [id number] Date of Birth: 04/19/1950  Today's Date: 12/12/2023 SLP Individual Time: 1000-1100 SLP Individual Time Calculation (min): 60 min  Short Term Goals: Week 1: SLP Short Term Goal 1 (Week 1): Pt will utilize compensatory speech strategies to maintain 80% intelligibility or greater at word level given modA SLP Short Term Goal 2 (Week 1): Pt will utilize compensatory speech strategies to maintain 60% intelligibility or greater at sentence level given modA SLP Short Term Goal 3 (Week 1): Pt will solve simple problems w/ modA SLP Short Term Goal 4 (Week 1): Pt will sustain attention for ~10 mins w/ modA SLP Short Term Goal 5 (Week 1): Pt will recall recent/relevant info w/ modA SLP Short Term Goal 6 (Week 1): Pt will tolerate regular textures w/ thin liquids w/ no overt s/s of airway invasion 90% of the time or greater  Skilled Therapeutic Interventions: SLP conducted skilled therapy session targeting communication goals. SLP facilitated a mildly complex sentence building task where pt read and re-read single words up to simple sentences. Patient ~75% intelligible given context and ~60% intelligible without context across 23/23 sentences. Patient's speech intelligibility greatly decreased as syllable and utterance length increased. Patient benefited from mod cues to reduce speed, pause, overarticulate, and increase volume. SLP and patient engaged in functional communication where pts speech intelligible decreased at the conversation level requiring cues to re-stated utterances to repair communication breakdowns. Patient was left in room with call bell in reach and alarm set. SLP will continue to target goals per plan of care.        Pain   None endorsed  Therapy/Group: Individual Therapy  Ashley Ellin, M.A., CCC-SLP  Ashley A Ellin 12/12/2023, 11:11 AM

## 2023-12-12 NOTE — Patient Care Conference (Signed)
 Inpatient RehabilitationTeam Conference and Plan of Care Update Date: 12/12/2023   Time: 11:32 AM   Patient was admitted after the interdisciplinary team meeting took place on 12/05/23; as a result, the first team conference occurred on day 8 of this patient's stay.     Patient Name: Henry [name]      Medical Record Number: [id number]  Date of Birth: [birth year] Sex: Male         Room/Bed: 4M03C/4M03C-01 Payor Info: Payor: MEDICARE / Plan: MEDICARE PART A AND B / Product Type: *No Product type* /    Admit Date/Time:  12/05/2023  6:03 PM  Primary Diagnosis:  Parkinson's disease [facility])  Hospital Problems: Principal Problem:   Parkinson's disease [facility])    Expected Discharge Date: Expected Discharge Date: 12/28/23  Team Members Present: Physician leading conference: Dr. Sven Elks Social Worker Present: Waverly Gentry, LCSW-A Nurse Present: Barnie Ronde, RN PT Present: [name], PT OT Present: Vera Pop, OT SLP Present: Rosina Downy, SLP     Current Status/Progress Goal Weekly Team Focus  Bowel/Bladder   Incont. bowel and bladder. Last BM 12/2   Maintain bowel and bladder regimen   Assist with timed toileting, continue to monitor and address as needed    Swallow/Nutrition/ Hydration   regular/thin liquid diet   supervision  ongoing diet tolerance    ADL's   Mod A UB, Max A LB, Toileting Max A   Min A   barriers: global weakness/deconditioning, activity tolerance, cognition, balance, safety awareness with RW    Mobility   bed mobility max A, transfers with RW mod A, gait 57ft with RW mod A   CGA overall, supervision WC mobility, and min A car transfer  barriers: fatigue, hx of Parkinson's, weakness/deconditioning, decreased standing balance/coordination    Communication   mod to maxA for use of speech intelligibility strategies   minA   use of speech intelligibility strategies at the phrase level    Safety/Cognition/ Behavioral  Observations  mod deficits in areas of orientation, short term memory, problem solving, sustained attention   minA   memory strategies, basic problem solving, recall of speech intelligibility strategies, orientation    Pain   No complaints of pain. Pain rated a 0.   Remain pain free during hostipalization.   Remain pain free during hostipalization.    Skin   Redness to sacrum and buttocks- foam applied to sacrum; Ecchymosis to bilateral arms   no skin breakdown/ wounds and decrease redness to sacrum; q 2 turns and frequent repositioning  no skin breakdown/ wounds and decrease redness to sacrum; q 2 turns and frequent repositioning      Discharge Planning:  Plans to discharge home with his wife, daughter and other family support. Has DME and a ramp. Awaiting therapy follow-up recommendations.   Team Discussion: Patient admitted with debility; history of Parkinson's and multiple medical conditions, cognitive deficits, balance issues, fatigue and dysarthria.   Patient on target to meet rehab goals: yes, currently needs max assist for bed mobility. Completes transfers with mod - max assist but able to ambulate using a RW 10-15' with mod assist.  Needs mod assist for upper body care and max assist for lower body care and toileting.  Needs mod - max assist for speech intelligibility; continues to work on orientation,recall, basic problem solving, sustained attention and dysarthria.  Goals for discharge set for CGA- min assist overall.  *See Care Plan and progress notes for long and short-term goals.   Revisions to Treatment  Plan:  CT scan (negative) EEG (Negative)  MBS  Teaching Needs: Safety, medications, transfers, toileting, etc.   Current Barriers to Discharge: Decreased caregiver support and Home enviroment access/layout  Possible Resolutions to Barriers: Family education Ramp for entry to home     Medical Summary Current Status: severe Parkinson's Disease, urianry  urgency, CHF, class 2 obesity  Barriers to Discharge: Medical stability  Barriers to Discharge Comments: severe Parkinson's disease, urinary urgency, CHF, class 2 obesity Possible Resolutions to [employer] Focus: continue levadopa, amantadine  started, flomax  started after supper, continue spironlactone/lasix /daily weights, provided dietary education to wife   Continued Need for Acute Rehabilitation Level of Care: The patient requires daily medical management by a physician with specialized training in physical medicine and rehabilitation for the following reasons: Direction of a multidisciplinary physical rehabilitation program to maximize functional independence : Yes Medical management of patient stability for increased activity during participation in an intensive rehabilitation regime.: Yes Analysis of laboratory values and/or radiology reports with any subsequent need for medication adjustment and/or medical intervention. : Yes   I attest that I was present, lead the team conference, and concur with the assessment and plan of the team.   Fredericka Barnie NOVAK 12/12/2023, 3:24 PM

## 2023-12-12 NOTE — Plan of Care (Signed)
  Problem: Consults Goal: RH GENERAL PATIENT EDUCATION Description: See Patient Education module for education specifics. Outcome: Progressing   Problem: RH BOWEL ELIMINATION Goal: RH STG MANAGE BOWEL WITH ASSISTANCE Description: STG Manage Bowel with supervision  Assistance. Outcome: Progressing   Problem: RH BLADDER ELIMINATION Goal: RH STG MANAGE BLADDER WITH ASSISTANCE Description: STG Manage Bladder With supervision  Assistance Outcome: Progressing   Problem: RH SKIN INTEGRITY Goal: RH STG SKIN FREE OF INFECTION/BREAKDOWN Description: Manage skin free of infection with supervision assistance Outcome: Progressing   Problem: RH SAFETY Goal: RH STG ADHERE TO SAFETY PRECAUTIONS W/ASSISTANCE/DEVICE Description: STG Adhere to Safety Precautions With supervision Assistance/Device. Outcome: Progressing   Problem: RH PAIN MANAGEMENT Goal: RH STG PAIN MANAGED AT OR BELOW PT'S PAIN GOAL Description: <4 w/ prns Outcome: Progressing   Problem: RH KNOWLEDGE DEFICIT GENERAL Goal: RH STG INCREASE KNOWLEDGE OF SELF CARE AFTER HOSPITALIZATION Description: Manage increase knowledge of self care after hospitalization with supervision assistance from family using educational materials provided Outcome: Progressing

## 2023-12-12 NOTE — Progress Notes (Signed)
 Occupational Therapy Session Note  Patient Details  Name: Henry [name] MRN: [id number] Date of Birth: 1950/04/17  Today's Date: 12/12/2023 OT Individual Time: 1131-1158 OT Individual Time Calculation (min): 27 min    Short Term Goals: Week 1:  OT Short Term Goal 1 (Week 1): Pt will complete UB bathing with Mod A. OT Short Term Goal 2 (Week 1): Pt will complete UB dressing with Mod A. OT Short Term Goal 3 (Week 1): Pt will complete step pivot transfer from bed <> WC with Mod Ax1 and RW if needed.  Skilled Therapeutic Interventions/Progress Updates:   Patient received seated on toilet.  Patient actively voiding.  Patient needing extra time to have continent BM as well.  Patient stood long enough to allow hygiene and clothing management. Family asking about sit to stand lift.  Patient able to make several transitions to stand, but unable to walk from bathroom.  Used Stedy lift to assist patient back to chair.  Patient transitions to stand with contact guard with overt cueing to get feet onto foot plate.  Patient needing cueing and min facilitation to prop in more upright position.  Left up in chair with safety belt in place and engaged.  Wife and grandchildren at bedside.  Direct handoff to nursing.    Therapy Documentation Precautions:  Precautions Precautions: Fall Recall of Precautions/Restrictions: Impaired Precaution/Restrictions Comments: Parkinson's, deep brain stimulator, R ear deaf Restrictions Weight Bearing Restrictions Per Provider Order: No   Pain:  Denies pain     Therapy/Group: Individual Therapy  Daray Polgar M 12/12/2023, 12:10 PM

## 2023-12-12 NOTE — Progress Notes (Signed)
 Physical Therapy Session Note  Patient Details  Name: Henry [name] MRN: [id number] Date of Birth: 12-08-1950  Today's Date: 12/12/2023 PT Individual Time: 0930-0955 PT Individual Time Calculation (min): 25 min   Short Term Goals: Week 1:  PT Short Term Goal 1 (Week 1): pt will transfer supine<>sitting EOB with mod A of 1 PT Short Term Goal 2 (Week 1): pt will transfer bed<>chair with LRAD and max A of 1 PT Short Term Goal 3 (Week 1): pt will transfer sit<>stand with max A of 1  Skilled Therapeutic Interventions/Progress Updates:   Received pt semi-reclined in bed, pt agreeable to PT treatment, and did not c/o pain during session. Session with emphasis on functional mobility/transfers, dressing, generalized strengthening and endurance, dynamic standing balance/coordination. Checked and pt found to be dry, pt transferred semi-reclined<>sitting R EOB with HOB elevated and use of bedrails with max A for BLE management and trunk control. Donned pants and shoes sitting EOB with max A and stood from elevated EOB with RW and mod A and required max A to pull pants over hips. Performed stand<>pivot into TIS WC with RW and min A and RN administered medication. Pt sat in WC at sink and brushed teeth/washed face with setup assist. Concluded session with pt sitting in TIS WC, needs within reach, and seatbelt alarm on.   Therapy Documentation Precautions:  Precautions Precautions: Fall Recall of Precautions/Restrictions: Impaired Precaution/Restrictions Comments: Parkinson's, deep brain stimulator, R ear deaf Restrictions Weight Bearing Restrictions Per Provider Order: No  Therapy/Group: Individual Therapy [name] [name] PT, DPT 12/12/2023, 6:59 AM

## 2023-12-12 NOTE — Progress Notes (Signed)
 Occupational Therapy Session Note  Patient Details  Name: [name] MRN: [id number] Date of Birth: 1950-06-07  Today's Date: 12/12/2023 OT Individual Time: 1423-1501 OT Individual Time Calculation (min): 38 min    Short Term Goals: Week 1:  OT Short Term Goal 1 (Week 1): Pt will complete UB bathing with Mod A. OT Short Term Goal 2 (Week 1): Pt will complete UB dressing with Mod A. OT Short Term Goal 3 (Week 1): Pt will complete step pivot transfer from bed <> WC with Mod Ax1 and RW if needed.  Skilled Therapeutic Interventions/Progress Updates:     Pt received deeply sleeping in [facility] requiring increased time to initiate participation in therapy session. Pt presenting to be in good spirits  reporting 0/10 pain- OT offering intermittent rest breaks, repositioning, and therapeutic support to optimize participation in therapy session. Focused this session on dynamic standing balance, standing tolerance, activity tolerance, and functional reaching. Transported Pt to therapy gym via wc. Engaged Pt in series of activities on BITs in standing position with functional reaching and dual tasking incorporated into activities. Pt able to tolerate standing for bouts of 20-45 sec with prolonged seated rest breaks provided between each bout. He is able to maintain standing balance with MIN A when reaching for targets, however when he fatigues he quickly returns to sitting in [facility] without warning. Transported Pt back to room via TISWC. Pt was left resting in TISWC with call bell in reach, seatbelt alarm on, and all needs met.    Therapy Documentation Precautions:  Precautions Precautions: Fall Recall of Precautions/Restrictions: Impaired Precaution/Restrictions Comments: Parkinson's, deep brain stimulator, R ear deaf Restrictions Weight Bearing Restrictions Per Provider Order: No   Therapy/Group: Individual Therapy  Katheryn SHAUNNA [place] 12/12/2023, 7:30 AM

## 2023-12-13 LAB — BASIC METABOLIC PANEL WITH GFR
Anion gap: 6 (ref 5–15)
BUN: 26 mg/dL — ABNORMAL HIGH (ref 8–23)
CO2: 36 mmol/L — ABNORMAL HIGH (ref 22–32)
Calcium: 8.8 mg/dL — ABNORMAL LOW (ref 8.9–10.3)
Chloride: 94 mmol/L — ABNORMAL LOW (ref 98–111)
Creatinine, Ser: 1.26 mg/dL — ABNORMAL HIGH (ref 0.61–1.24)
GFR, Estimated: >60 mL/min (ref >60)
Glucose, Bld: 164 mg/dL — ABNORMAL HIGH (ref 70–99)
Potassium: 3.8 mmol/L (ref 3.5–5.1)
Sodium: 136 mmol/L (ref 135–145)

## 2023-12-13 LAB — CBC
HCT: 41.5 % (ref 39.0–52.0)
Hemoglobin: 13.4 g/dL (ref 13.0–17.0)
MCH: 28.7 pg (ref 26.0–34.0)
MCHC: 32.3 g/dL (ref 30.0–36.0)
MCV: 88.9 fL (ref 80.0–100.0)
Platelets: 144 K/uL — ABNORMAL LOW (ref 150–400)
RBC: 4.67 MIL/uL (ref 4.22–5.81)
RDW: 14.2 % (ref 11.5–15.5)
WBC: 6.3 K/uL (ref 4.0–10.5)
nRBC: 0 % (ref 0.0–0.2)

## 2023-12-13 LAB — GLUCOSE, CAPILLARY
Glucose-Capillary: 141 mg/dL — ABNORMAL HIGH (ref 70–99)
Glucose-Capillary: 130 mg/dL — ABNORMAL HIGH (ref 70–99)
Glucose-Capillary: 135 mg/dL — ABNORMAL HIGH (ref 70–99)

## 2023-12-13 LAB — MAGNESIUM: Magnesium: 2.2 mg/dL (ref 1.7–2.4)

## 2023-12-13 MED ORDER — VITAMIN D 25 MCG (1000 UNIT) PO TABS
3000.0000 [IU] | ORAL_TABLET | Freq: Every day | ORAL | Status: DC
  Administered 2023-12-14 – 2023-12-24 (×11): 3000 [IU] via ORAL
  Filled 2023-12-13 (×11): qty 3

## 2023-12-13 MED ORDER — FUROSEMIDE 20 MG PO TABS
20.0000 mg | ORAL_TABLET | Freq: Every day | ORAL | Status: DC
  Administered 2023-12-14 – 2023-12-16 (×3): 20 mg via ORAL
  Filled 2023-12-13 (×3): qty 1

## 2023-12-13 MED ORDER — AMANTADINE HCL 100 MG PO CAPS
100.0000 mg | ORAL_CAPSULE | Freq: Two times a day (BID) | ORAL | Status: DC
  Administered 2023-12-13 – 2023-12-18 (×10): 100 mg via ORAL
  Filled 2023-12-13 (×11): qty 1

## 2023-12-13 MED ORDER — AMANTADINE HCL 100 MG PO CAPS: 100.0000 mg | ORAL_CAPSULE | Freq: Two times a day (BID) | ORAL | Status: DC

## 2023-12-13 NOTE — Progress Notes (Signed)
 Patient ID: Henry [name], male   DOB: [birth year], 73 y.o.   MRN: [id number]  Discussed follow-up therapy services with patient/family   Prefers [facility] but has used [facility] in the past.

## 2023-12-13 NOTE — Progress Notes (Signed)
 Physical Therapy Session Note  Patient Details  Name: Henry [name] MRN: [id number] Date of Birth: 1950-04-29  Today's Date: 12/13/2023 PT Individual Time: 1140-1205 PT Individual Time Calculation (min): 25 min   Short Term Goals: Week 1:  PT Short Term Goal 1 (Week 1): pt will transfer supine<>sitting EOB with mod A of 1 PT Short Term Goal 2 (Week 1): pt will transfer bed<>chair with LRAD and max A of 1 PT Short Term Goal 3 (Week 1): pt will transfer sit<>stand with max A of 1  Skilled Therapeutic Interventions/Progress Updates: Pt presented in TIS sleeping but easily aroused. Pt agreeable to therapy and denies pain. Pt transported to day room and set up at [employer] at 20cm/sec x 2 min bouts using metronome to maintain beat at 75 BPM. Pt with rest breaks between bouts. On last bout resistance decreased to 40 cm/sec. Pt required multimodal cues consistently for increased range. Pt intermittently nearing stop and demonstrating minimal push however with tactile cues would be able to resume. Once completed pt transported back to room and remained in TIS with call bell within reach, wife present and needs met.       Therapy Documentation Precautions:  Precautions Precautions: Fall Recall of Precautions/Restrictions: Impaired Precaution/Restrictions Comments: Parkinson's, deep brain stimulator, R ear deaf Restrictions Weight Bearing Restrictions Per Provider Order: No General:   Vital Signs:   Pain: Pain Assessment Pain Scale: 0-10 Pain Score: 0-No pain   Therapy/Group: Individual Therapy  Mujahid Jalomo 12/13/2023, 3:53 PM

## 2023-12-13 NOTE — Progress Notes (Signed)
 PROGRESS NOTE   Subjective/Complaints: No new complaints this morning Henry [name] notes improved function during therapy today- was able to walk 70 feet! He does appear more alert  ROS: +diffuse weakness, denies pain, +urinary urgency, improving ambulation with therapy  Objective:   No results found.  Recent Labs    12/13/23 0802  WBC 6.3  HGB 13.4  HCT 41.5  PLT 144*   Recent Labs    12/11/23 0821 12/13/23 0802  NA 139 136  K 3.8 3.8  CL 93* 94*  CO2 37* 36*  GLUCOSE 120* 164*  BUN 28* 26*  CREATININE 1.21 1.26*  CALCIUM  8.8* 8.8*   No intake or output data in the 24 hours ending 12/13/23 0936       Physical Exam: Vital Signs Blood pressure 118/80, pulse 65, temperature 97.6 F (36.4 C), resp. rate 17, height 5' 9 (1.753 m), weight 113.3 kg, SpO2 95%. Constitutional: No distress . Vital signs reviewed. obese HEENT: NCAT, EOMI, oral membranes moist Neck: supple Cardiovascular: RRR without murmur. No JVD    Respiratory/Chest: CTA Bilaterally without wheezes or rales. Normal effort. O2 [place]   GI/Abdomen: BS +, non-tender, non-distended Ext: no clubbing, cyanosis, or edema Psych: flat but cooperative  Skin: Bruising noted on bilateral upper extremities Neuro:     Mental Status: AAOx4, memory deficits present, intermittant confusion, delayed responses, able to provide his date of birth Speech/Languate: Naming and repetition intact,   severe dysarthria this morning, follows simple commands. Pill rolling tremor bilaterally CRANIAL NERVES: II: PERRL. Visual fields full III, IV, VI: EOM intact, no gaze preference or deviation V: normal sensation bilaterally VII: no asymmetry VIII: normal hearing to speech IX, X: normal palatal elevation XI: head turn intact b/l XII: Tongue midline     MOTOR: [name] moves BUE and BLE with 3/5, struggles to initiate, stable 12/4   SENSORY: Normal to touch all 4 extremities    persistent rigidity in b/l legs Right greater than left         Assessment/Plan: 1. Functional deficits which require 3+ hours per day of interdisciplinary therapy in a comprehensive inpatient rehab setting. Physiatrist is providing close team supervision and 24 hour management of active medical problems listed below. Physiatrist and rehab team continue to assess barriers to discharge/monitor patient progress toward functional and medical goals  Care Tool:  Bathing    Body parts bathed by patient: Face, Right arm, Left arm, Chest   Body parts bathed by helper: Abdomen, Front perineal area, Buttocks, Right upper leg, Left upper leg, Right lower leg, Left lower leg     Bathing assist Assist Level: Maximal Assistance - Patient 24 - 49%     Upper Body Dressing/Undressing Upper body dressing   What is the patient wearing?: Pull over shirt    Upper body assist Assist Level: Maximal Assistance - Patient 25 - 49%    Lower Body Dressing/Undressing Lower body dressing      What is the patient wearing?: Pants, Incontinence brief     Lower body assist Assist for lower body dressing: Dependent - Patient 0%     Toileting Toileting    Toileting assist Assist for toileting: Total  Assistance - Patient < 25%     Transfers Chair/bed transfer  Transfers assist     Chair/bed transfer assist level: Moderate Assistance - Patient 50 - 74%     Locomotion Ambulation   Ambulation assist   Ambulation activity did not occur: Safety/medical concerns (decresed responsiveness, weakness, decreased balance, unable to respond to simple 1 step commands)  Assist level: 2 helpers Assistive device: Walker-rolling Max distance: 25ft   Walk 10 feet activity   Assist  Walk 10 feet activity did not occur: Safety/medical concerns (decresed responsiveness, weakness, decreased balance, unable to respond to simple 1 step commands)  Assist level: 2 helpers Assistive device: Walker-rolling    Walk 50 feet activity   Assist Walk 50 feet with 2 turns activity did not occur: Safety/medical concerns (decresed responsiveness, weakness, decreased balance, unable to respond to simple 1 step commands)         Walk 150 feet activity   Assist Walk 150 feet activity did not occur: Safety/medical concerns (decresed responsiveness, weakness, decreased balance, unable to respond to simple 1 step commands)         Walk 10 feet on uneven surface  activity   Assist Walk 10 feet on uneven surfaces activity did not occur: Safety/medical concerns (decresed responsiveness, weakness, decreased balance, unable to respond to simple 1 step commands)         Wheelchair     Assist Is the patient using a wheelchair?: Yes Type of Wheelchair: Manual (TIS WC)    Wheelchair assist level: Dependent - Patient 0% Max wheelchair distance: >352ft    Wheelchair 50 feet with 2 turns activity    Assist        Assist Level: Dependent - Patient 0%   Wheelchair 150 feet activity     Assist      Assist Level: Dependent - Patient 0%   Blood pressure 118/80, pulse 65, temperature 97.6 F (36.4 C), resp. rate 17, height 5' 9 (1.753 m), weight 113.3 kg, SpO2 95%.  Medical Problem List and Plan: 1. Functional deficits secondary to generalized weakness due to parkinson disease. [name] has DBS.              -patient may shower             -ELOS/Goals: 14-16, [name] sup to CGA, OT mon/mod A, SLP sup to min A             11/30 -[name] with worsening dysarthria and weakness this past week. Both HCT and EEG reviewed. No acute changes on CT and EEG demonstrates diffuse slowing. Neurology did not recommend any changes yesterday.    - I don't see any infectious, pharmaceutical, or metabolic reasons for presentation. Labs were ok yesterday   -likely progression of PD unfortunately   -continue to monitor, encourage OOB activity   -perhaps follow up with neurology this week 2.   Antithrombotics: -DVT/anticoagulation:  Pharmaceutical: continue Lovenox              -antiplatelet therapy: ASA  3. Episodes of blank stares: EEG ordered and is consistent with encephalopathy- no seizures evident  4. Mood/Behavior/Sleep: LCSW to follow for evaluation and support.              -antipsychotic agents: N/A  5. Neuropsych/cognition: This patient may be intermittent  capable of making decisions on his own behalf.  6. Skin/Wound Care: Routine pressure relief measures  7. Class 2 obesity: provided wife with a list of foods for weight loss, continue  magnesium  supplement  8. PD s/p DBS/Autonomic dysfunction: Monitor for orthostatic symptoms with increase in activity             -continue sinemet  ER five times a day.  --Pimavanserin  daily to  manage hallucinations  continue metanx Amantadine 100mg  daily started, increase to 8am and noon BID dosing  9. T2DM: Monitor BS ac/hs, d/c ISS to help get patient on home regimen, provided a list of foods that are good for diabetes             --continue Lantus  15 units Bid. Started metformin  500mg  daily with breakfast, decrease aspart to 3U TID  CBG (last 3)  Recent Labs    12/12/23 1730 12/12/23 2100 12/13/23 0647  GLUCAP 151* 140* 141*    Decrease CBGs to AC 10. HFrEF: Strict I/O. Daily weights and monitor for signs of overload.              --Low salt diet. Continue ASA, Lasix , Cozaar , Toprol  XL and aldactone . Asked nursing to please weigh him today, lasix  10mg  additional ordered 11/28 for slight increase in weight  11. Glaucoma: Managed with cosopt   12. Hyperactive airway disease: continue Breo.   13. Thrush: Continue nystatin  mouth wash  14. AKI: Decrease lasix  to 30mg  daily, resolved, asked nursing to please weigh patient, daily weights ordered, recheck Cr tomorrow  15.  Thrombocytopenia: PC reviewed and is stable   16. Urinary retention: d/c mirabegron . Will monitor voiding with PVR checks. Flomax started after  supper  17. Constipation: LBM 12/3- messaged nursing to confirm accuracy. d/c miralax , asked nursing to please offer prune juice, continue magnesium  oxide 200mg  daily, added on magnesium  level  18. Concern for seizures: monitor for signs/symptoms, EEG ordered and shows no evidence of seizures  19. Spasticity/dystonia: patient follows with Dr. Carilyn for botox  outpatient  20. Mild vascular congestion with mild atelectasis on CXR: weaned off O2 during the day time, encouraged use of incentive spirometer. Discussed with patient/wife/therapy weaning O2 with therapy, decrease lasix  to 20mg  daily given worsening creatinine, incentive spirometer ordered, weight reviewed and has decreased  LOS: 8 days A FACE TO FACE EVALUATION WAS PERFORMED  Henry [name] Henry [name] 12/13/2023, 9:36 AM

## 2023-12-13 NOTE — Progress Notes (Signed)
 Occupational Therapy Weekly Progress Note  Patient Details  Name: Henry [name] MRN: [id number] Date of Birth: 02/18/1950  Beginning of progress report period: December 07, 2023 End of progress report period: December 13, 2023  Today's Date: 12/13/2023 OT Individual Time: 8554-8441 OT Individual Time Calculation (min): 73 min    Patient has met 1 of 3 short term goals.  Patient has fewer opportunities to o upper body ADL last few sessions.  Patient is showing significant improvement in functional mobility, and level of arousal.    Patient continues to demonstrate the following deficits: muscle weakness and muscle joint tightness, abnormal tone, decreased coordination, and decreased motor planning, and decreased initiation, decreased attention, decreased memory, and delayed processing and therefore will continue to benefit from skilled OT intervention to enhance overall performance with BADL.  Patient progressing toward long term goals..  Continue plan of care.  OT Short Term Goals Week 1:  OT Short Term Goal 1 (Week 1): Pt will complete UB bathing with Mod A. OT Short Term Goal 1 - Progress (Week 1): Progressing toward goal OT Short Term Goal 2 (Week 1): Pt will complete UB dressing with Mod A. OT Short Term Goal 2 - Progress (Week 1): Progressing toward goal OT Short Term Goal 3 (Week 1): Pt will complete step pivot transfer from bed <> WC with Mod Ax1 and RW if needed. OT Short Term Goal 3 - Progress (Week 1): Met Week 2:  OT Short Term Goal 1 (Week 2): Pt will complete UB bathing with Mod A. OT Short Term Goal 2 (Week 2): Pt will complete UB dressing with Mod A. OT Short Term Goal 3 (Week 2): Patient will complete toilet transfer with min assist  Skilled Therapeutic Interventions/Progress Updates:   Patient received seated in wheelchair, reclines and sleeping.  Patient awoke with time, and was agreeable to leave room for OT session.  Patient declined need to void prior to leaving  room, stating I just went after dinner.   Transported to gym to address functional mobility with emphasis on: sit to stand, stand to sit with decreased reliance on arms, stand tolerance, stand balance, standing and unweighting one leg, and standing and freeing arms for functional tasks.   Worked on upper extremity exercise with 2 lb weighted bar.  Worked on LOWE'S COMPANIES exercises in sitting, and also rotational movement while seated.   Worked on walking and turning to sit in chair.  Patient walked ~ 20 ft x 2.  Patient with freezing in legs with both attempts to turn and needed physical assistance to reset stepping.   Walked from hallway to room ~ 35 ft with wife following with wheelchair as patient reports fatigue, and low back ache.   Patient left up in chair with leg rests in place.  Patient declined reclined position.  Safety belt in place and engaged and call bell, personal items in reach.    Therapy Documentation Precautions:  Precautions Precautions: Fall Recall of Precautions/Restrictions: Impaired Precaution/Restrictions Comments: Parkinson's, deep brain stimulator, R ear deaf Restrictions Weight Bearing Restrictions Per Provider Order: No   Pain: Pain Assessment Pain Scale: 0-10 Pain Score: 0-No pain    Therapy/Group: Individual Therapy  [name] M 12/13/2023, 3:10 PM

## 2023-12-13 NOTE — Progress Notes (Signed)
 Speech Language Pathology Daily Session Note  Patient Details  Name: Henry [name] MRN: [id number] Date of Birth: [birth year]  Today's Date: 12/13/2023 SLP Individual Time: 1000-1100 SLP Individual Time Calculation (min): 60 min  Short Term Goals: Week 1: SLP Short Term Goal 1 (Week 1): Pt will utilize compensatory speech strategies to maintain 80% intelligibility or greater at word level given modA SLP Short Term Goal 2 (Week 1): Pt will utilize compensatory speech strategies to maintain 60% intelligibility or greater at sentence level given modA SLP Short Term Goal 3 (Week 1): Pt will solve simple problems w/ modA SLP Short Term Goal 4 (Week 1): Pt will sustain attention for ~10 mins w/ modA SLP Short Term Goal 5 (Week 1): Pt will recall recent/relevant info w/ modA SLP Short Term Goal 6 (Week 1): Pt will tolerate regular textures w/ thin liquids w/ no overt s/s of airway invasion 90% of the time or greater  Skilled Therapeutic Interventions: SLP conducted skilled therapy session targeting swallowing and communication goals. SLP presented patient with Dys3/thin liquid items to assess diet tolerance. Patient exhibited slowed mastication but full oral clearance with solids. No overt difficulty. With thin liquids, patient endorses frequent coughing. During SLP assessment, patient demonstrated only min delayed throat clearing, however silent aspiration and reduced sensation is common in PD population as disease progresses. Given patient's reports, session observations, and diagnosis of Parkinson's Disease, patient would benefit from instrumental swallow study to objectively assess oropharyngeal swallowing function. SLP will plan to complete MBS at next available opportunity. In remaining minutes of session, SLP introduced SPEAK OUT! Warm up exercises to promote increased vocal intensity and articulatory precision. Patient completed variable vowel syllable repetition, loud Ahs, pitch glides, and  effortful counting with overall mod assist. SLP provided handout to promote carryover. Patient then used increased volume at the word level with mod cues for consistent carryover of strategies. Patient was left in room with call bell in reach and alarm set. SLP will continue to target goals per plan of care.        Pain  None  Therapy/Group: Individual Therapy  Ritchard Paragas, M.A., CCC-SLP  Karianna Gusman A Keary Waterson 12/13/2023, 11:00 AM

## 2023-12-13 NOTE — Progress Notes (Signed)
 Physical Therapy Session Note  Patient Details  Name: Henry [name] MRN: [id number] Date of Birth: 01-Jan-1951  Today's Date: 12/13/2023 PT Individual Time: 9153-9043 PT Individual Time Calculation (min): 70 min   Short Term Goals: Week 1:  PT Short Term Goal 1 (Week 1): pt will transfer supine<>sitting EOB with mod A of 1 PT Short Term Goal 2 (Week 1): pt will transfer bed<>chair with LRAD and max A of 1 PT Short Term Goal 3 (Week 1): pt will transfer sit<>stand with max A of 1  Skilled Therapeutic Interventions/Progress Updates:   Received pt semi-reclined in bed asleep. Upon awakening, pt agreeable to PT treatment and denied any pain during session - provided pt with warm washcloth to wash face. Session with emphasis on functional mobility/transfers, dressing, generalized strengthening and endurance, dynamic standing balance/coordination, NMR, and gait training. Pt transferred semi-reclined<>sitting R EOB with HOB elevated and heavy mod A with hand over hand cues for rolling technique. Pt required less assist with trunk control to transition to sitting EOB this morning. Donned shoes with max A and worked on unsupported sitting balance while ordering meals - mild posterior bias requiring light min A and verbal cues to correct. Stood from elevated EOB with RW and CGA and performed stand<>pivot into TIS WC with RW and light min A. Pt sat in WC at sink and brushed teeth with setup assist.    Pt transported to/from room in TIS WC dependently. Pt performed remainder of transfers from TIS W with RW and CGA throughout session. Pt ambulated 74ft with RW and min A in straight line. Pt ambulates with festinating gait pattern although appeared to take longer steps today. Pt also demonstrates flexed trunk/downward gaze and required cues for upright posture/gaze and to keep RW within BOS; noted poor carry over with cues.   Played 60bpm metronome and pt ambulated additional 78ft x1 and 54ft x 1 with RW and  min A with emphasis on turning. Pt demonstrated mild freezing episodes when turning and with tendency to push RW even further ahead, demonstrating poor safety awareness with turns. On final ambulation bout, pt with major freezing episode and unable to advance feet, communicate, began zoning out, and pushing RW forward - +2 required to bring [facility] for pt to sit. Transitioned to seated BLE strengthening on Kinetron at 20 cm/sec for 1 minute to 80bpm metronome. Returned to room and concluded session with pt sitting in TIS WC, needs within reach, and seatbelt alarm on.   Therapy Documentation Precautions:  Precautions Precautions: Fall Recall of Precautions/Restrictions: Impaired Precaution/Restrictions Comments: Parkinson's, deep brain stimulator, R ear deaf Restrictions Weight Bearing Restrictions Per Provider Order: Yes  Therapy/Group: Individual Therapy [name] [name] PT, DPT 12/13/2023, 6:57 AM

## 2023-12-14 ENCOUNTER — Inpatient Hospital Stay (HOSPITAL_COMMUNITY)

## 2023-12-14 LAB — GLUCOSE, CAPILLARY
Glucose-Capillary: 113 mg/dL — ABNORMAL HIGH (ref 70–99)
Glucose-Capillary: 138 mg/dL — ABNORMAL HIGH (ref 70–99)
Glucose-Capillary: 115 mg/dL — ABNORMAL HIGH (ref 70–99)
Glucose-Capillary: 131 mg/dL — ABNORMAL HIGH (ref 70–99)

## 2023-12-14 MED ORDER — INSULIN ASPART 100 UNIT/ML IJ SOLN
2.0000 [IU] | Freq: Three times a day (TID) | INTRAMUSCULAR | Status: DC
  Administered 2023-12-14 – 2023-12-18 (×12): 2 [IU] via SUBCUTANEOUS
  Filled 2023-12-14 (×12): qty 2

## 2023-12-14 NOTE — Procedures (Signed)
 Modified Barium Swallow Study  Patient Details  Name: Henry [name] MRN: [id number] Date of Birth: 1950/09/11  Today's Date: 12/14/2023  Modified Barium Swallow completed.  Full report located under Chart Review in the Imaging Section.  History of Present Illness Henry [name] is a 73 year old male with history of PD s/p DBS, autonomic dysfunction, T2DM, Chronic systolic and diastolic CHF, ICM w/chronic systolic/diastolic HF, PAF- not on AC due to falls, OSA, HOH, MI, recent episodes of staring with poor responsiveness and DBS adjusted. He was admitted on 11/27/23 with reports of staring BLE edema, episodes, with decrease in LOC and leaning to the right. left sided weakness and difficulty walking. BLE dopplers negative for DVT. CT head repeated and negative for acute changes.   LT-EEG ordered and showed mild generalized dysfunction without epileptiform discharge or seizures. No documented prior objective swallow study has been completed despite PD diagnosis.   Clinical Impression Patient presents with mild oral and pharyngoesophageal dysphagia characterized by global inefficiency spanning all involved swallowing mechanisms. Importantly, patient's airway protection mechanisms remain intact across all presentations. Orally, patient demonstrates slow tongue motion, prolonged mastication, and diffuse mild oral residue with all textures. Patient's swallow is timely and hyolaryngeal elevation/excursion and epiglottic inversion appear intact. Patient presents with patent esophagus at rest and reduced pharyngeal (and suspected esophageal) peristalsis resulting in mild residue in the pyriform sinuses and upper esophageal segment. Residue appears to be most significant with thin liquids and reduces in amount as bolus viscosity increases. SLP trialed pill in thin liquids with patient exhibiting mild difficulty with oral AP transit but otherwise no bolus misdirection or pharyngeal retention noted. Recommend  continuation of regular/thin liquid diet with medications administered whole with thin liquids. SLP will continue to follow to monitor diet tolerance and provide education to family re: importance of monitoring swallowing function across progression of PD.  Factors that may increase risk of adverse event in presence of aspiration Noe & Lianne 2021): Reduced cognitive function;Limited mobility;Weak cough;Dependence for feeding and/or oral hygiene  Swallow Evaluation Recommendations Recommendations: PO diet PO Diet Recommendation: Regular;Thin liquids (Level 0) Liquid Administration via: Cup;Straw Medication Administration: Whole meds with liquid Supervision: Full assist for feeding Swallowing strategies  : Minimize environmental distractions;Slow rate Postural changes: Position pt fully upright for meals Oral care recommendations: Oral care BID (2x/day)   Henry [name], M.A., CCC-SLP    Henry [name] 12/14/2023,10:13 AM

## 2023-12-14 NOTE — Progress Notes (Signed)
 Speech Language Pathology Weekly Progress Note  Patient Details  Name: Henry [name] MRN: [id number] Date of Birth: 05-11-1950  Beginning of progress report period: December 07, 2023 End of progress report period: December 14, 2023  Short Term Goals: Week 1: SLP Short Term Goal 1 (Week 1): Pt will utilize compensatory speech strategies to maintain 80% intelligibility or greater at word level given modA SLP Short Term Goal 1 - Progress (Week 1): Met SLP Short Term Goal 2 (Week 1): Pt will utilize compensatory speech strategies to maintain 60% intelligibility or greater at sentence level given modA SLP Short Term Goal 2 - Progress (Week 1): Not met SLP Short Term Goal 3 (Week 1): Pt will solve simple problems w/ modA SLP Short Term Goal 3 - Progress (Week 1): Met SLP Short Term Goal 4 (Week 1): Pt will sustain attention for ~10 mins w/ modA SLP Short Term Goal 4 - Progress (Week 1): Met SLP Short Term Goal 5 (Week 1): Pt will recall recent/relevant info w/ modA SLP Short Term Goal 5 - Progress (Week 1): Progressing toward goal SLP Short Term Goal 6 (Week 1): Pt will tolerate regular textures w/ thin liquids w/ no overt s/s of airway invasion 90% of the time or greater SLP Short Term Goal 6 - Progress (Week 1): Met    New Short Term Goals: Week 2: SLP Short Term Goal 1 (Week 2): Pt will utilize compensatory speech strategies to maintain 80% intelligibility or greater at phrase level given modA SLP Short Term Goal 2 (Week 2): Patient will complete SPEAK OUT! vocal intensity exercises with mod assist and use of visual aid. SLP Short Term Goal 3 (Week 2): Pt will solve simple problems w/ minA SLP Short Term Goal 4 (Week 2): Pt will sustain attention for ~15 mins w/ modA SLP Short Term Goal 5 (Week 2): Pt will tolerate regular textures w/ thin liquids w/ no overt s/s of airway invasion 95% of the time or greater SLP Short Term Goal 6 (Week 2): Pt will recall recent/relevant info w/  modA  Weekly Progress Updates: Patient has made steady progress towards therapy goals, meeting 4/6 short term goals set this reporting period and progressing towards remaining. Patient currently benefits from mod assist to achieve 80% intelligibility at the word level and mod-max assist for consistent use of speech intelligibility strategies at the sentence level. SLP introduced SPEAK OUT! Vocal intensity exercises this reporting period and will plan to enforce completion of these exercises daily using visual aid for carryover and to combat vocal deterioration d/t PD. Patient currently tolerates regular/thin liquid diet and completed MBS this reporting period revealing mild oropharyngeal dysphagia d/t oral and pharyngeal inefficiency. Patient solves simple problems and sustains attention for 10 minute intervals with mod assist. Memory was not directly targeted this reporting period. Patient and family education ongoing. Patient will continue to benefit from skilled therapy services during remainder of CIR stay.      Intensity: Minumum of 1-2 x/day, 30 to 90 minutes Frequency: 3 to 5 out of 7 days Duration/Length of Stay: 3 weeks Treatment/Interventions: Cognitive remediation/compensation;Environmental controls;Multimodal communication approach;Speech/Language facilitation;Therapeutic Activities;Functional tasks;Cueing hierarchy;Therapeutic Exercise;Patient/family education;Dysphagia/aspiration precaution training;Oral motor exercises   Rosina Downy, M.A., CCC-SLP  Timarie Labell A Abbagayle Zaragoza 12/14/2023, 9:45 AM

## 2023-12-14 NOTE — Progress Notes (Signed)
 Physical Therapy Session Note  Patient Details  Name: Henry [name] MRN: [id number] Date of Birth: Apr 27, 1950  Today's Date: 12/14/2023 PT Individual Time: 8699-8645 PT Individual Time Calculation (min): 54 min   Short Term Goals: Week 1:  PT Short Term Goal 1 (Week 1): pt will transfer supine<>sitting EOB with mod A of 1 PT Short Term Goal 2 (Week 1): pt will transfer bed<>chair with LRAD and max A of 1 PT Short Term Goal 3 (Week 1): pt will transfer sit<>stand with max A of 1  Skilled Therapeutic Interventions/Progress Updates:   Received pt sitting in TIS WC, pt agreeable to PT treatment, and denied any pain during session. Session with emphasis on functional mobility/transfers, toileting, generalized strengthening and endurance, dynamic standing balance/coordination, NMR, and gait training. Pt reported urge to toilet - performed all transfers with RW and CGA throughout session. Pt ambulated in/out of bathroom with RW and min A. Pt with 2 freezingepisodes when entering bathroom and 1 when exiting but able to overcome them and continue walking. Pt also required max cues for proximity to RW. Required max A for clothing management and continent of bowel. Performed hygiene management in standing dependently with max cues for upright posture due significant trunk flexion. Upon returning to [facility], pt failing to turn all the way around to sit and required mod A to safely pivot hips into WC - required rest break after toileting, pt stating that was a workout.  Pt transported to/from room in TIS WC dependently. Pt ambulated 79ft x 2 trials with RW and min A. Pt continued to push RW too far forward, having difficulty overcoming freezing episodes, and with poor carry over of cues. Attached 8lb weights to RW and red TB to weight down RW and for visual cue to remain closer to RW. Pt ambulated additional 62ft with RW and min A before requesting to sit. Pt improved with ability to remain close to RW but  still demonstrated freezing episodes. Returned to room and concluded session with pt sitting in TIS WC, needs within reach, and seatbelt alarm on.   Therapy Documentation Precautions:  Precautions Precautions: Fall Recall of Precautions/Restrictions: Impaired Precaution/Restrictions Comments: Parkinson's, deep brain stimulator, R ear deaf Restrictions Weight Bearing Restrictions Per Provider Order: No  Therapy/Group: Individual Therapy [name] [name] PT, DPT 12/14/2023, 6:57 AM

## 2023-12-14 NOTE — Progress Notes (Signed)
 Physical Therapy Weekly Progress Note  Patient Details  Name: KAYO ZION MRN: [id number] Date of Birth: 1950/10/30  Beginning of progress report period: December 07, 2023 End of progress report period: December 14, 2023  Patient has met 2 of 3 short term goals. Pt demonstrates steady progress towards long term goals. Pt currently requires max A to roll L/R in bed and heavy mod/light max A to transition R sidelying<>sitting EOB. Pt has progressed to sit<>stands from elevated surfaces with RW and min A (some transfers with as little as CGA), and is able to ambulate up to 53ft with RW and min A. Pt continues to be limited by impaired motor planning/sequencing, freezing episodes, particularly with turns, and weakness/deconditioning.   Patient continues to demonstrate the following deficits muscle weakness, decreased cardiorespiratoy endurance, impaired timing and sequencing, abnormal tone, unbalanced muscle activation, decreased coordination, and decreased motor planning, decreased motor planning, decreased initiation, decreased awareness, decreased problem solving, decreased safety awareness, decreased memory, and delayed processing, and decreased standing balance, decreased postural control, and decreased balance strategies and therefore will continue to benefit from skilled PT intervention to increase functional independence with mobility.  Patient progressing toward long term goals..  Continue plan of care.  PT Short Term Goals Week 1:  PT Short Term Goal 1 (Week 1): pt will transfer supine<>sitting EOB with mod A of 1 PT Short Term Goal 1 - Progress (Week 1): Progressing toward goal PT Short Term Goal 2 (Week 1): pt will transfer bed<>chair with LRAD and max A of 1 PT Short Term Goal 2 - Progress (Week 1): Met PT Short Term Goal 3 (Week 1): pt will transfer sit<>stand with max A of 1 PT Short Term Goal 3 - Progress (Week 1): Met Week 2:  PT Short Term Goal 1 (Week 2): pt will transfer  supine<>sitting EOB with mod A of 1 PT Short Term Goal 2 (Week 2): pt will transfer sit<>stand with LRAD and min A consistantly PT Short Term Goal 3 (Week 2): pt will propel WC 77ft with CGA  Skilled Therapeutic Interventions/Progress Updates:  Ambulation/gait training;Discharge planning;Functional mobility training;Psychosocial support;Therapeutic Activities;Visual/perceptual remediation/compensation;Balance/vestibular training;Disease management/prevention;Neuromuscular re-education;Skin care/wound management;Therapeutic Exercise;Wheelchair propulsion/positioning;Cognitive remediation/compensation;DME/adaptive equipment instruction;Pain management;Splinting/orthotics;UE/LE Strength taining/ROM;Community reintegration;Functional electrical stimulation;Patient/family education;UE/LE Coordination activities   Therapy Documentation Precautions:  Precautions Precautions: Fall Recall of Precautions/Restrictions: Impaired Precaution/Restrictions Comments: Parkinson's, deep brain stimulator, R ear deaf Restrictions Weight Bearing Restrictions Per Provider Order: No  Therapy/Group: Individual Therapy [name] [name] PT, DPT 12/14/2023, 6:59 AM

## 2023-12-14 NOTE — Progress Notes (Signed)
 PROGRESS NOTE   Subjective/Complaints: Somnolent this morning Patient's chart reviewed- No issues reported overnight Vitals signs stable   ROS: +diffuse weakness, denies pain, +urinary urgency, improving ambulation with therapy  Objective:   DG Swallowing Func-Speech Pathology Result Date: 12/14/2023 Table formatting from the original result was not included. Modified Barium Swallow Study Patient Details Name: Henry [name] MRN: [id number] Date of Birth: 15-Mar-1950 Today's Date: 12/14/2023 HPI/PMH: HPI: Henry [name] is a 73 year old male with history of PD s/Henry DBS, autonomic dysfunction, T2DM, Chronic systolic and diastolic CHF, ICM w/chronic systolic/diastolic HF, PAF- not on AC due to falls, OSA, HOH, MI, recent episodes of staring with poor responsiveness and DBS adjusted. He was admitted on 11/27/23 with reports of staring BLE edema, episodes, with decrease in LOC and leaning to the right. left sided weakness and difficulty walking. BLE dopplers negative for DVT. CT head repeated and negative for acute changes.   LT-EEG ordered and showed mild generalized dysfunction without epileptiform discharge or seizures. No documented prior objective swallow study has been completed despite PD diagnosis. Clinical Impression: Clinical Impression: Patient presents with mild oral and pharyngoesophageal dysphagia characterized by global inefficiency spanning all involved swallowing mechanisms. Importantly, patient's airway protection mechanisms remain intact across all presentations. Orally, patient demonstrates slow tongue motion, prolonged mastication, and diffuse mild oral residue with all textures. Patient's swallow is timely and hyolaryngeal elevation/excursion and epiglottic inversion appear intact. Patient presents with patent esophagus at rest and reduced pharyngeal (and suspected esophageal) peristalsis resulting in mild residue in the pyriform  sinuses and upper esophageal segment. Residue appears to be most significant with thin liquids and reduces in amount as bolus viscosity increases. SLP trialed pill in thin liquids with patient exhibiting mild difficulty with oral AP transit but otherwise no bolus misdirection or pharyngeal retention noted. Recommend continuation of regular/thin liquid diet with medications administered whole with thin liquids. SLP will continue to follow to monitor diet tolerance and provide education to family re: importance of monitoring swallowing function across progression of PD. Factors that may increase risk of adverse event in presence of aspiration Noe & Lianne 2021): Factors that may increase risk of adverse event in presence of aspiration Noe & Lianne 2021): Reduced cognitive function; Limited mobility; Weak cough; Dependence for feeding and/or oral hygiene Recommendations/Plan: Swallowing Evaluation Recommendations Swallowing Evaluation Recommendations Recommendations: PO diet PO Diet Recommendation: Regular; Thin liquids (Level 0) Liquid Administration via: Cup; Straw Medication Administration: Whole meds with liquid Supervision: Full assist for feeding Swallowing strategies  : Minimize environmental distractions; Slow rate Postural changes: Position pt fully upright for meals Oral care recommendations: Oral care BID (2x/day) Treatment Plan Treatment Plan Treatment recommendations: Therapy as outlined in treatment plan below Follow-up recommendations: Home health SLP Functional status assessment: Patient has had a recent decline in their functional status and demonstrates the ability to make significant improvements in function in a reasonable and predictable amount of time. Treatment frequency: Min 2x/week Treatment duration: 2 weeks Interventions: Patient/family education; Diet toleration management by SLP Recommendations Recommendations for follow up therapy are one component of a multi-disciplinary  discharge planning process, led by the attending physician.  Recommendations may be updated based on  patient status, additional functional criteria and insurance authorization. Assessment: Orofacial Exam: Orofacial Exam Oral Cavity: Oral Hygiene: WFL Oral Cavity - Dentition: Adequate natural dentition Orofacial Anatomy: WFL Oral Motor/Sensory Function: Generalized oral weakness Anatomy: Anatomy: WFL Boluses Administered: Boluses Administered Boluses Administered: Mildly thick liquids (Level 2, nectar thick); Thin liquids (Level 0); Moderately thick liquids (Level 3, honey thick); Puree; Solid  Oral Impairment Domain: Oral Impairment Domain Lip Closure: Escape progressing to mid-chin Tongue control during bolus hold: Cohesive bolus between tongue to palatal seal Bolus preparation/mastication: Slow prolonged chewing/mashing with complete recollection Bolus transport/lingual motion: Slow tongue motion Oral residue: Residue collection on oral structures Location of oral residue : Tongue; Palate Initiation of pharyngeal swallow : Valleculae  Pharyngeal Impairment Domain: Pharyngeal Impairment Domain Soft palate elevation: No bolus between soft palate (SP)/pharyngeal wall (PW) Laryngeal elevation: Complete superior movement of thyroid  cartilage with complete approximation of arytenoids to epiglottic petiole Anterior hyoid excursion: Complete anterior movement Epiglottic movement: Complete inversion Laryngeal vestibule closure: Complete, no air/contrast in laryngeal vestibule Pharyngeal stripping wave : Present - diminished Pharyngeal contraction (A/Henry view only): N/A Pharyngoesophageal segment opening: Complete distension and complete duration, no obstruction of flow (patent esophagus, open at rest) Tongue base retraction: Trace column of contrast or air between tongue base and PPW Pharyngeal residue: Collection of residue within or on pharyngeal structures Location of pharyngeal residue: Pyriform sinuses (in patent  esophagus after the swallow)  Esophageal Impairment Domain: Esophageal Impairment Domain Esophageal clearance upright position: Esophageal retention (in upper esophagus due to suspected reduced peristalsis) Pill: Pill Consistency administered: Thin liquids (Level 0) Thin liquids (Level 0): [facility] Penetration/Aspiration Scale Score: Penetration/Aspiration Scale Score 1.  Material does not enter airway: Thin liquids (Level 0); Mildly thick liquids (Level 2, nectar thick); Moderately thick liquids (Level 3, honey thick); Puree; Solid; Pill Compensatory Strategies: Compensatory Strategies Compensatory strategies: No   General Information: Caregiver present: No  Diet Prior to this Study: Regular; Thin liquids (Level 0)   Temperature : Normal   Respiratory Status: WFL   Supplemental O2: None (Room air)   History of Recent Intubation: No  Behavior/Cognition: Alert; Cooperative Self-Feeding Abilities: Able to self-feed Baseline vocal quality/speech: Hypophonia/low volume; Dysphonic Volitional Cough: Able to elicit Volitional Swallow: Able to elicit Exam Limitations: No limitations Goal Planning: Prognosis for improved oropharyngeal function: Fair Barriers to Reach Goals: Cognitive deficits; Overall medical prognosis No data recorded No data recorded Consulted and agree with results and recommendations: Patient Pain: Pain Assessment Pain Assessment: No/denies pain End of Session: Start Time:No data recorded Stop Time: No data recorded Time Calculation:No data recorded Charges: No data recorded SLP visit diagnosis: SLP Visit Diagnosis: Dysphagia, pharyngoesophageal phase (R13.14); Dysphagia, oral phase (R13.11) Past Medical History: Past Medical History: Diagnosis Date  AICD (automatic cardioverter/defibrillator) present   Dr Waddell office visit yearly, MDT  medtronic   Arthritis   cane  Asthma   CAD (coronary artery disease)   Cardiomyopathy   Cataract   removed  Complication of anesthesia   pt states that he got a rash   Constipation   Deaf   right ear, 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)  DM (diabetes mellitus) (HCC)   TYPE 2 - insulin  pump  Dysrhythmia   a-fib  GERD (gastroesophageal reflux disease)   Glaucoma   right eye  History of kidney stones   multiple  HLD (hyperlipidemia)   HTN (hypertension)   pt denies 08/19/12  Hyperplasia, prostate   Hypothyroidism   MI (myocardial infarction) (HCC)   Dr Pietro  2000, x3vessels bypass  Neuromuscular disorder (HCC)   Parkinson's Disease  OSA (obstructive sleep apnea)   AHI-28,on CPAP, noncompliant with CPAP  Parkinson disease (HCC)   1999  PONV (postoperative nausea and vomiting)   Restless legs   Shortness of breath   Hx: of at all times  UTI (lower urinary tract infection) 09/15/2012  Klebsiella  Ventral hernia   Walker as ambulation aid   also uses wheelchair at home/when going out Past Surgical History: Past Surgical History: Procedure Laterality Date  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  BIOPSY  10/14/2018  Procedure: BIOPSY;  Surgeon: Albertus Gordy HERO, MD;  Location: [facility];  Service: Gastroenterology;;  CATARACT EXTRACTION W/ INTRAOCULAR LENS IMPLANT    Hx: of right eye  CATARACT EXTRACTION W/ INTRAOCULAR LENS IMPLANT Left 2018  COLONOSCOPY N/A 10/13/2014  Procedure: COLONOSCOPY;  Surgeon: Gordy HERO Albertus, MD;  Location: [facility];  Service: Gastroenterology;  Laterality: N/A;  COLONOSCOPY W/ BIOPSIES AND POLYPECTOMY    Hx: of  COLONOSCOPY WITH PROPOFOL  N/A 10/14/2018  Procedure: COLONOSCOPY WITH PROPOFOL ;  Surgeon: Albertus Gordy HERO, MD;  Location: [facility];  Service: Gastroenterology;  Laterality: N/A;  CORONARY ARTERY BYPASS GRAFT  2000  Sudie Laine, MD  CORONARY STENT PLACEMENT  1998  DEEP BRAIN STIMULATOR PLACEMENT  2004  Right and left VIN stimulator placement (parkinsons)  EYE SURGERY    Cataract removed in 1980s related to wood in eye when 73 years old  FINGER AMPUTATION    left pointer  IMPLANTABLE CARDIOVERTER DEFIBRILLATOR IMPLANT N/A 11/13/2013   Procedure: IMPLANTABLE CARDIOVERTER DEFIBRILLATOR IMPLANT;  Surgeon: Danelle LELON Birmingham, MD;  Location: [facility] C[facility] LAB;  Service: Cardiovascular;  Laterality: N/A;  INSERT / REPLACE / REMOVE PACEMAKER    medtronic  LEFT AND RIGHT HEART CATHETERIZATION WITH CORONARY ANGIOGRAM N/A 09/24/2013  Procedure: LEFT AND RIGHT HEART CATHETERIZATION WITH CORONARY ANGIOGRAM;  Surgeon: Lonni JONETTA Cash, MD;  Location: [facility] CATH LAB;  Service: Cardiovascular;  Laterality: N/A;  LITHOTRIPSY    3 different times  POLYPECTOMY  10/14/2018  Procedure: POLYPECTOMY;  Surgeon: Albertus Gordy HERO, MD;  Location: [facility];  Service: Gastroenterology;;  PULSE GENERATOR IMPLANT Right 11/13/2017  Procedure: Right chest implantable pulse generator change;  Surgeon: Unice Pac, MD;  Location: [facility] OR;  Service: Neurosurgery;  Laterality: Right;  Right chest implantable pulse generator change  SUBTHALAMIC STIMULATOR BATTERY REPLACEMENT N/A 09/05/2012  Procedure: Deep brain stimulator battery change;  Surgeon: Pac Unice, MD;  Location: [facility];  Service: Neurosurgery;  Laterality: N/A;  Deep brain stimulator battery change  SUBTHALAMIC STIMULATOR BATTERY REPLACEMENT N/A 06/10/2015  Procedure: Deep Brain stimulator battery change;  Surgeon: Pac Unice, MD;  Location: [facility];  Service: Neurosurgery;  Laterality: N/A;  SUBTHALAMIC STIMULATOR BATTERY REPLACEMENT Right 09/25/2019  Procedure: Deep brain stimulator battery change;  Surgeon: Unice Pac, MD;  Location: [facility] OR;  Service: Neurosurgery;  Laterality: Right;  SUBTHALAMIC STIMULATOR BATTERY REPLACEMENT Right 12/15/2022  Procedure: IMPLANTABLE PULSE GENERATOR  BATTERY REPLACEMENT;  Surgeon: Carollee Lani BROCKS, DO;  Location: [facility] OR;  Service: Neurosurgery;  Laterality: Right;  3C  TONSILLECTOMY   Rosina LABOR Ellin 12/14/2023, 10:14 AM   Recent Labs    12/13/23 0802  WBC 6.3  HGB 13.4  HCT 41.5  PLT 144*   Recent Labs    12/13/23 0802  NA 136  K 3.8  CL 94*  CO2 36*  GLUCOSE  164*  BUN 26*  CREATININE 1.26*  CALCIUM  8.8*  Intake/Output Summary (Last 24 hours) at 12/14/2023 1211 Last data filed at 12/13/2023 1313 Gross per 24 hour  Intake 120 ml  Output --  Net 120 ml         Physical Exam: Vital Signs Blood pressure 106/71, pulse 63, temperature 98.2 F (36.8 C), temperature source Oral, resp. rate 18, height 5' 9 (1.753 m), weight 114.7 kg, SpO2 96%. Constitutional: No distress . Vital signs reviewed. obese HEENT: NCAT, EOMI, oral membranes moist Neck: supple Cardiovascular: RRR without murmur. No JVD    Respiratory/Chest: CTA Bilaterally without wheezes or rales. Normal effort. O2 [place]   GI/Abdomen: BS +, non-tender, non-distended Ext: no clubbing, cyanosis, or edema Psych: flat but cooperative  Skin: Bruising noted on bilateral upper extremities Neuro:     Mental Status: AAOx4, memory deficits present, intermittant confusion, delayed responses, able to provide his date of birth Speech/Languate: Naming and repetition intact,   severe dysarthria this morning, follows simple commands. Pill rolling tremor bilaterally CRANIAL NERVES: II: PERRL. Visual fields full III, IV, VI: EOM intact, no gaze preference or deviation V: normal sensation bilaterally VII: no asymmetry VIII: normal hearing to speech IX, X: normal palatal elevation XI: head turn intact b/l XII: Tongue midline     MOTOR: Pt moves BUE and BLE with 3/5, struggles to initiate, stable 12/5 SENSORY: Normal to touch all 4 extremities   persistent rigidity in b/l legs Right greater than left         Assessment/Plan: 1. Functional deficits which require 3+ hours per day of interdisciplinary therapy in a comprehensive inpatient rehab setting. Physiatrist is providing close team supervision and 24 hour management of active medical problems listed below. Physiatrist and rehab team continue to assess barriers to discharge/monitor patient progress toward functional and medical  goals  Care Tool:  Bathing    Body parts bathed by patient: Face, Right arm, Left arm, Chest   Body parts bathed by helper: Abdomen, Front perineal area, Buttocks, Right upper leg, Left upper leg, Right lower leg, Left lower leg     Bathing assist Assist Level: Maximal Assistance - Patient 24 - 49%     Upper Body Dressing/Undressing Upper body dressing   What is the patient wearing?: Pull over shirt    Upper body assist Assist Level: Maximal Assistance - Patient 25 - 49%    Lower Body Dressing/Undressing Lower body dressing      What is the patient wearing?: Pants, Incontinence brief     Lower body assist Assist for lower body dressing: Dependent - Patient 0%     Toileting Toileting    Toileting assist Assist for toileting: Total Assistance - Patient < 25%     Transfers Chair/bed transfer  Transfers assist     Chair/bed transfer assist level: Minimal Assistance - Patient > 75%     Locomotion Ambulation   Ambulation assist   Ambulation activity did not occur: Safety/medical concerns (decresed responsiveness, weakness, decreased balance, unable to respond to simple 1 step commands)  Assist level: Minimal Assistance - Patient > 75% Assistive device: Walker-rolling Max distance: 40ft   Walk 10 feet activity   Assist  Walk 10 feet activity did not occur: Safety/medical concerns (decresed responsiveness, weakness, decreased balance, unable to respond to simple 1 step commands)  Assist level: Minimal Assistance - Patient > 75% Assistive device: Walker-rolling   Walk 50 feet activity   Assist Walk 50 feet with 2 turns activity did not occur: Safety/medical concerns (decresed responsiveness, weakness, decreased balance, unable to  respond to simple 1 step commands)         Walk 150 feet activity   Assist Walk 150 feet activity did not occur: Safety/medical concerns (decresed responsiveness, weakness, decreased balance, unable to respond to simple 1  step commands)         Walk 10 feet on uneven surface  activity   Assist Walk 10 feet on uneven surfaces activity did not occur: Safety/medical concerns (decresed responsiveness, weakness, decreased balance, unable to respond to simple 1 step commands)         Wheelchair     Assist Is the patient using a wheelchair?: Yes Type of Wheelchair: Manual (TIS WC)    Wheelchair assist level: Dependent - Patient 0% Max wheelchair distance: >351ft    Wheelchair 50 feet with 2 turns activity    Assist        Assist Level: Dependent - Patient 0%   Wheelchair 150 feet activity     Assist      Assist Level: Dependent - Patient 0%   Blood pressure 106/71, pulse 63, temperature 98.2 F (36.8 C), temperature source Oral, resp. rate 18, height 5' 9 (1.753 m), weight 114.7 kg, SpO2 96%.  Medical Problem List and Plan: 1. Functional deficits secondary to generalized weakness due to parkinson disease. Pt has DBS.              -patient may shower             -ELOS/Goals: 14-16, PT sup to CGA, OT mon/mod A, SLP sup to min A             11/30 -pt with worsening dysarthria and weakness this past week. Both HCT and EEG reviewed. No acute changes on CT and EEG demonstrates diffuse slowing. Neurology did not recommend any changes yesterday.    - I don't see any infectious, pharmaceutical, or metabolic reasons for presentation. Labs were ok yesterday   -likely progression of PD unfortunately   -continue to monitor, encourage OOB activity   -perhaps follow up with neurology this week 2.  Antithrombotics: -DVT/anticoagulation:  Pharmaceutical: continue Lovenox              -antiplatelet therapy: ASA  3. Episodes of blank stares: EEG ordered and is consistent with encephalopathy- no seizures evident  4. Mood/Behavior/Sleep: LCSW to follow for evaluation and support.              -antipsychotic agents: N/A  5. Neuropsych/cognition: This patient may be intermittent  capable of  making decisions on his own behalf.  6. Skin/Wound Care: Routine pressure relief measures  7. Class 2 obesity: provided wife with a list of foods for weight loss, continue magnesium  supplement  8. PD s/Henry DBS/Autonomic dysfunction: Monitor for orthostatic symptoms with increase in activity             -continue sinemet  ER five times a day.  --Pimavanserin  daily to  manage hallucinations  continue metanx Amantadine  100mg  daily started, increase to 8am and noon BID dosing  9. T2DM: Monitor BS ac/hs, d/c ISS to help get patient on home regimen, provided a list of foods that are good for diabetes             --continue Lantus  15 units Bid. Started metformin  500mg  daily with breakfast, decrease aspart to 2U TID  CBG (last 3)  Recent Labs    12/13/23 1737 12/14/23 0557 12/14/23 1205  GLUCAP 135* 113* 138*    Decrease CBGs to AC 10.  HFrEF: Strict I/O. Daily weights and monitor for signs of overload.              --Low salt diet. Continue ASA, Lasix , Cozaar , Toprol  XL and aldactone . Asked nursing to please weigh him today, lasix  10mg  additional ordered 11/28 for slight increase in weight  11. Glaucoma: Managed with cosopt   12. Hyperactive airway disease: continue Breo.   13. Thrush: Continue nystatin  mouth wash  14. AKI: Decrease lasix  to 20mg  daily, asked nursing to please weigh patient, daily weights ordered, monitor creatinine at least weekly  15.  Thrombocytopenia: PC reviewed and is stable   16. Urinary retention: d/c mirabegron . Will monitor voiding with PVR checks. Flomax  started after supper, continue  17. Constipation: LBM 12/4. d/c miralax , asked nursing to please offer prune juice, continue magnesium  oxide 200mg  daily, magnesium  level reviewed and is 2.2 on 12/4.  18. Concern for seizures: monitor for signs/symptoms, EEG ordered and shows no evidence of seizures  19. Spasticity/dystonia: patient follows with Dr. Carilyn for botox  outpatient  20. Mild vascular congestion  with mild atelectasis on CXR: weaned off O2 during the day time, encouraged use of incentive spirometer. Discussed with patient/wife/therapy weaning O2 with therapy, decrease lasix  to 20mg  daily given worsening creatinine, incentive spirometer ordered, weight reviewed and is stable  LOS: 9 days A FACE TO FACE EVALUATION WAS PERFORMED  Henry [name] Henry [name] 12/14/2023, 12:11 PM

## 2023-12-14 NOTE — Progress Notes (Signed)
 Occupational Therapy Session Note  Patient Details  Name: Henry [name] MRN: [id number] Date of Birth: 1950/02/19  Session 1 Today's Date: 12/14/2023 OT Individual Time: 8952-8793 OT Individual Time Calculation (min): 79 min  Session 2Today's Date: 12/14/2023 OT Individual Time: 1418-1500 OT Individual Time Calculation (min): 42 min    Short Term Goals: Week 2:  OT Short Term Goal 1 (Week 2): Pt will complete UB bathing with Mod A. OT Short Term Goal 2 (Week 2): Pt will complete UB dressing with Mod A. OT Short Term Goal 3 (Week 2): Patient will complete toilet transfer with min assist  Skilled Therapeutic Interventions/Progress Updates:  Session 1: Skilled OT session completed to address ADL retraining. Pt received supine in bed sleeping, once awake, agreeable to participate in therapy. Pt reports no pain.  Supine>EOB with Mod A to stabilize trunk and BLE. Pt displaying decreased sitting balance. Pt donned shoes with Mod A. Pt requesting to void, STS at RW with Mod A to reach standing. Pt completed toileting with Max A for clothing mgmt and stabilizing urinal. Pt begins to experience bowel urgency. Pt completed functional mobility to toilet with Min A for RW mgmt. Pt required Max A for toileting hygiene. Pt completed 5x STS at grab bars for toileting with Mod A. D/t fatigue pt requires steady transfers remainder of session. Toilet>TTB with steady. Pt completed shower level ADL with Mod A for UB/LB bathing, pt able to wash upper leg to knee level and abdomen. Pt required Max A for peri care. TTB>WC. UB dressing with Min A to pull shirt over head. Pt completed LB dressing with Max A, pt attempts to pull pants up over bottom standing at RW d/t LOB task discontinued and OT A. Pt seated in [facility] with chair alarm on and all needs within reach.   Of note: during LB bathing, superficial scratch at L lower leg. RN notified.  Session 2: Skilled OT session completed to address LB dressing and  functional activity tolerance. Pt received seated in TIS WC, agreeable to participate in therapy. Pt reports no pain.  Pt dependently propelled to main gym. Pt completed clothes pin removal from posterior region standing at RW to simulate clothing mgmt during toileting hygiene 2x. PT provided tactile/visual cue to RW to support posture and safety with RW. OT reinforced cues during activity. Pt completed 2x STS at RW with Min A to reach standing, VC required to decrease trunk flexion during activity. Pt able to remove 6/6 clothes pin each trial with increased time. Pt required 2 seated rest breaks during activity. Pt required VC prior to sitting to reach back for chair and control landing.   Pt completed the following BUE exs in standing to increase functional activity tolerance and BUE strength in support of ADL participation.  -3x10 chest press with 2lb weight -3x10 shoulder press 2lb weight -3x10 bicep curls 2lb weight -3x10 shoulder rolls 1lb weight Pt requesting seated rest break following each set d/t fatigue, OT downgrades task to seated exs and pt completes PNF patterns to support continuous movement during activity. Pt dependently propelled back to room with chair alarm on and all needs within reach.  Therapy Documentation Precautions:  Precautions Precautions: Fall Recall of Precautions/Restrictions: Impaired Precaution/Restrictions Comments: Parkinson's, deep brain stimulator, R ear deaf Restrictions Weight Bearing Restrictions Per Provider Order: No   Therapy/Group: Individual Therapy  Bhavana Kady Woods-Chance, MS, OTR/L 12/14/2023, 7:47 AM

## 2023-12-15 LAB — GLUCOSE, CAPILLARY
Glucose-Capillary: 125 mg/dL — ABNORMAL HIGH (ref 70–99)
Glucose-Capillary: 107 mg/dL — ABNORMAL HIGH (ref 70–99)
Glucose-Capillary: 188 mg/dL — ABNORMAL HIGH (ref 70–99)
Glucose-Capillary: 157 mg/dL — ABNORMAL HIGH (ref 70–99)

## 2023-12-15 NOTE — Plan of Care (Signed)
  Problem: Consults Goal: RH GENERAL PATIENT EDUCATION Description: See Patient Education module for education specifics. Outcome: Progressing   Problem: RH BLADDER ELIMINATION Goal: RH STG MANAGE BLADDER WITH ASSISTANCE Description: STG Manage Bladder With supervision Assistance Outcome: Progressing   Problem: RH SKIN INTEGRITY Goal: RH STG SKIN FREE OF INFECTION/BREAKDOWN Description: Manage skin free of infection with supervision assistance Outcome: Progressing

## 2023-12-15 NOTE — Progress Notes (Signed)
 PROGRESS NOTE   Subjective/Complaints: No events overnight. Vitals with HR 45, sat 89% 1x this AM; others normal    12/15/2023    8:01 AM 12/15/2023    5:00 AM 12/15/2023    4:24 AM  Vitals with BMI  Weight  252 lbs 7 oz   BMI  37.26   Systolic   145  Diastolic   91  Pulse 64 63 45    Recent Labs    12/14/23 1630 12/14/23 2046 12/15/23 0601  GLUCAP 115* 131* 125*     P.o. intakes appropriate   Continent of bladder intermittently - PVRs low Last BM 12/5   ROS: +diffuse weakness, denies pain,  +urinary urgency, improving ambulation with therapy  Objective:   DG Swallowing Func-Speech Pathology Result Date: 12/14/2023 Table formatting from the original result was not included. Modified Barium Swallow Study Patient Details Name: Henry [name] MRN: [id number] Date of Birth: 09-10-1950 Today's Date: 12/14/2023 HPI/PMH: HPI: Henry [name] is a 73 year old male with history of PD s/p DBS, autonomic dysfunction, T2DM, Chronic systolic and diastolic CHF, ICM w/chronic systolic/diastolic HF, PAF- not on AC due to falls, OSA, HOH, MI, recent episodes of staring with poor responsiveness and DBS adjusted. He was admitted on 11/27/23 with reports of staring BLE edema, episodes, with decrease in LOC and leaning to the right. left sided weakness and difficulty walking. BLE dopplers negative for DVT. CT head repeated and negative for acute changes.   LT-EEG ordered and showed mild generalized dysfunction without epileptiform discharge or seizures. No documented prior objective swallow study has been completed despite PD diagnosis. Clinical Impression: Clinical Impression: Patient presents with mild oral and pharyngoesophageal dysphagia characterized by global inefficiency spanning all involved swallowing mechanisms. Importantly, patient's airway protection mechanisms remain intact across all presentations. Orally, patient demonstrates slow  tongue motion, prolonged mastication, and diffuse mild oral residue with all textures. Patient's swallow is timely and hyolaryngeal elevation/excursion and epiglottic inversion appear intact. Patient presents with patent esophagus at rest and reduced pharyngeal (and suspected esophageal) peristalsis resulting in mild residue in the pyriform sinuses and upper esophageal segment. Residue appears to be most significant with thin liquids and reduces in amount as bolus viscosity increases. SLP trialed pill in thin liquids with patient exhibiting mild difficulty with oral AP transit but otherwise no bolus misdirection or pharyngeal retention noted. Recommend continuation of regular/thin liquid diet with medications administered whole with thin liquids. SLP will continue to follow to monitor diet tolerance and provide education to family re: importance of monitoring swallowing function across progression of PD. Factors that may increase risk of adverse event in presence of aspiration Noe & Lianne 2021): Factors that may increase risk of adverse event in presence of aspiration Noe & Lianne 2021): Reduced cognitive function; Limited mobility; Weak cough; Dependence for feeding and/or oral hygiene Recommendations/Plan: Swallowing Evaluation Recommendations Swallowing Evaluation Recommendations Recommendations: PO diet PO Diet Recommendation: Regular; Thin liquids (Level 0) Liquid Administration via: Cup; Straw Medication Administration: Whole meds with liquid Supervision: Full assist for feeding Swallowing strategies  : Minimize environmental distractions; Slow rate Postural changes: Position pt fully upright for meals Oral care recommendations: Oral care BID (2x/day)  Treatment Plan Treatment Plan Treatment recommendations: Therapy as outlined in treatment plan below Follow-up recommendations: Home health SLP Functional status assessment: Patient has had a recent decline in their functional status and demonstrates  the ability to make significant improvements in function in a reasonable and predictable amount of time. Treatment frequency: Min 2x/week Treatment duration: 2 weeks Interventions: Patient/family education; Diet toleration management by SLP Recommendations Recommendations for follow up therapy are one component of a multi-disciplinary discharge planning process, led by the attending physician.  Recommendations may be updated based on patient status, additional functional criteria and insurance authorization. Assessment: Orofacial Exam: Orofacial Exam Oral Cavity: Oral Hygiene: WFL Oral Cavity - Dentition: Adequate natural dentition Orofacial Anatomy: WFL Oral Motor/Sensory Function: Generalized oral weakness Anatomy: Anatomy: WFL Boluses Administered: Boluses Administered Boluses Administered: Mildly thick liquids (Level 2, nectar thick); Thin liquids (Level 0); Moderately thick liquids (Level 3, honey thick); Puree; Solid  Oral Impairment Domain: Oral Impairment Domain Lip Closure: Escape progressing to mid-chin Tongue control during bolus hold: Cohesive bolus between tongue to palatal seal Bolus preparation/mastication: Slow prolonged chewing/mashing with complete recollection Bolus transport/lingual motion: Slow tongue motion Oral residue: Residue collection on oral structures Location of oral residue : Tongue; Palate Initiation of pharyngeal swallow : Valleculae  Pharyngeal Impairment Domain: Pharyngeal Impairment Domain Soft palate elevation: No bolus between soft palate (SP)/pharyngeal wall (PW) Laryngeal elevation: Complete superior movement of thyroid  cartilage with complete approximation of arytenoids to epiglottic petiole Anterior hyoid excursion: Complete anterior movement Epiglottic movement: Complete inversion Laryngeal vestibule closure: Complete, no air/contrast in laryngeal vestibule Pharyngeal stripping wave : Present - diminished Pharyngeal contraction (A/P view only): N/A Pharyngoesophageal  segment opening: Complete distension and complete duration, no obstruction of flow (patent esophagus, open at rest) Tongue base retraction: Trace column of contrast or air between tongue base and PPW Pharyngeal residue: Collection of residue within or on pharyngeal structures Location of pharyngeal residue: Pyriform sinuses (in patent esophagus after the swallow)  Esophageal Impairment Domain: Esophageal Impairment Domain Esophageal clearance upright position: Esophageal retention (in upper esophagus due to suspected reduced peristalsis) Pill: Pill Consistency administered: Thin liquids (Level 0) Thin liquids (Level 0): [facility] Penetration/Aspiration Scale Score: Penetration/Aspiration Scale Score 1.  Material does not enter airway: Thin liquids (Level 0); Mildly thick liquids (Level 2, nectar thick); Moderately thick liquids (Level 3, honey thick); Puree; Solid; Pill Compensatory Strategies: Compensatory Strategies Compensatory strategies: No   General Information: Caregiver present: No  Diet Prior to this Study: Regular; Thin liquids (Level 0)   Temperature : Normal   Respiratory Status: WFL   Supplemental O2: None (Room air)   History of Recent Intubation: No  Behavior/Cognition: Alert; Cooperative Self-Feeding Abilities: Able to self-feed Baseline vocal quality/speech: Hypophonia/low volume; Dysphonic Volitional Cough: Able to elicit Volitional Swallow: Able to elicit Exam Limitations: No limitations Goal Planning: Prognosis for improved oropharyngeal function: Fair Barriers to Reach Goals: Cognitive deficits; Overall medical prognosis No data recorded No data recorded Consulted and agree with results and recommendations: Patient Pain: Pain Assessment Pain Assessment: No/denies pain End of Session: Start Time:No data recorded Stop Time: No data recorded Time Calculation:No data recorded Charges: No data recorded SLP visit diagnosis: SLP Visit Diagnosis: Dysphagia, pharyngoesophageal phase (R13.14); Dysphagia, oral  phase (R13.11) Past Medical History: Past Medical History: Diagnosis Date  AICD (automatic cardioverter/defibrillator) present   Dr Waddell office visit yearly, MDT  medtronic   Arthritis   cane  Asthma   CAD (coronary artery disease)   Cardiomyopathy   Cataract   removed  Complication of anesthesia   pt states that he got a rash  Constipation   Deaf   right ear, 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)  DM (diabetes mellitus) (HCC)   TYPE 2 - insulin  pump  Dysrhythmia   a-fib  GERD (gastroesophageal reflux disease)   Glaucoma   right eye  History of kidney stones   multiple  HLD (hyperlipidemia)   HTN (hypertension)   pt denies 08/19/12  Hyperplasia, prostate   Hypothyroidism   MI (myocardial infarction) (HCC)   Dr Pietro 2000, x3vessels bypass  Neuromuscular disorder (HCC)   Parkinson's Disease  OSA (obstructive sleep apnea)   AHI-28,on CPAP, noncompliant with CPAP  Parkinson disease (HCC)   1999  PONV (postoperative nausea and vomiting)   Restless legs   Shortness of breath   Hx: of at all times  UTI (lower urinary tract infection) 09/15/2012  Klebsiella  Ventral hernia   Walker as ambulation aid   also uses wheelchair at home/when going out Past Surgical History: Past Surgical History: Procedure Laterality Date  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  BIOPSY  10/14/2018  Procedure: BIOPSY;  Surgeon: Albertus Gordy HERO, MD;  Location: [facility];  Service: Gastroenterology;;  CATARACT EXTRACTION W/ INTRAOCULAR LENS IMPLANT    Hx: of right eye  CATARACT EXTRACTION W/ INTRAOCULAR LENS IMPLANT Left 2018  COLONOSCOPY N/A 10/13/2014  Procedure: COLONOSCOPY;  Surgeon: Gordy HERO Albertus, MD;  Location: [facility];  Service: Gastroenterology;  Laterality: N/A;  COLONOSCOPY W/ BIOPSIES AND POLYPECTOMY    Hx: of  COLONOSCOPY WITH PROPOFOL  N/A 10/14/2018  Procedure: COLONOSCOPY WITH PROPOFOL ;  Surgeon: Albertus Gordy HERO, MD;  Location: [facility];  Service: Gastroenterology;  Laterality: N/A;  CORONARY ARTERY BYPASS GRAFT   2000  Sudie Laine, MD  CORONARY STENT PLACEMENT  1998  DEEP BRAIN STIMULATOR PLACEMENT  2004  Right and left VIN stimulator placement (parkinsons)  EYE SURGERY    Cataract removed in 1980s related to wood in eye when 73 years old  FINGER AMPUTATION    left pointer  IMPLANTABLE CARDIOVERTER DEFIBRILLATOR IMPLANT N/A 11/13/2013  Procedure: IMPLANTABLE CARDIOVERTER DEFIBRILLATOR IMPLANT;  Surgeon: Danelle LELON Birmingham, MD;  Location: [facility] CATH LAB;  Service: Cardiovascular;  Laterality: N/A;  INSERT / REPLACE / REMOVE PACEMAKER    medtronic  LEFT AND RIGHT HEART CATHETERIZATION WITH CORONARY ANGIOGRAM N/A 09/24/2013  Procedure: LEFT AND RIGHT HEART CATHETERIZATION WITH CORONARY ANGIOGRAM;  Surgeon: Lonni JONETTA Cash, MD;  Location: [facility] CATH LAB;  Service: Cardiovascular;  Laterality: N/A;  LITHOTRIPSY    3 different times  POLYPECTOMY  10/14/2018  Procedure: POLYPECTOMY;  Surgeon: Albertus Gordy HERO, MD;  Location: [facility];  Service: Gastroenterology;;  PULSE GENERATOR IMPLANT Right 11/13/2017  Procedure: Right chest implantable pulse generator change;  Surgeon: Unice Pac, MD;  Location: [facility] OR;  Service: Neurosurgery;  Laterality: Right;  Right chest implantable pulse generator change  SUBTHALAMIC STIMULATOR BATTERY REPLACEMENT N/A 09/05/2012  Procedure: Deep brain stimulator battery change;  Surgeon: Pac Unice, MD;  Location: [facility];  Service: Neurosurgery;  Laterality: N/A;  Deep brain stimulator battery change  SUBTHALAMIC STIMULATOR BATTERY REPLACEMENT N/A 06/10/2015  Procedure: Deep Brain stimulator battery change;  Surgeon: Pac Unice, MD;  Location: [facility];  Service: Neurosurgery;  Laterality: N/A;  SUBTHALAMIC STIMULATOR BATTERY REPLACEMENT Right 09/25/2019  Procedure: Deep brain stimulator battery change;  Surgeon: Unice Pac, MD;  Location: [facility] OR;  Service: Neurosurgery;  Laterality: Right;  SUBTHALAMIC STIMULATOR BATTERY REPLACEMENT Right 12/15/2022  Procedure: IMPLANTABLE PULSE  GENERATOR  BATTERY REPLACEMENT;  Surgeon: Carollee Lani BROCKS, DO;  Location: [facility] OR;  Service: Neurosurgery;  Laterality: Right;  3C  TONSILLECTOMY   Rosina LABOR Ellin 12/14/2023, 10:14 AM   Recent Labs    12/13/23 0802  WBC 6.3  HGB 13.4  HCT 41.5  PLT 144*   Recent Labs    12/13/23 0802  NA 136  K 3.8  CL 94*  CO2 36*  GLUCOSE 164*  BUN 26*  CREATININE 1.26*  CALCIUM  8.8*    Intake/Output Summary (Last 24 hours) at 12/15/2023 0851 Last data filed at 12/15/2023 0500 Gross per 24 hour  Intake 358 ml  Output 750 ml  Net -392 ml         Physical Exam: Vital Signs Blood pressure (!) 145/91, pulse 64, temperature (!) 97.5 F (36.4 C), resp. rate 16, height 5' 9 (1.753 m), weight 114.5 kg, SpO2 93%. Constitutional: No distress . Vital signs reviewed. obese HEENT: NCAT, EOMI, oral membranes moist Neck: supple Cardiovascular: RRR without murmur. No JVD    Respiratory/Chest: CTA Bilaterally without wheezes or rales. Normal effort. O2 [place]   GI/Abdomen: BS +, non-tender, non-distended Ext: no clubbing, cyanosis, or edema Psych: flat but cooperative  Skin: Bruising noted on bilateral upper extremities--stable Neuro:     Mental Status: AAOx to person and place; partially to time (month as November)  + memory deficits present, intermittant confusion, delayed responses,   Speech/Languate:  severe dysarthria  follows simple commands. Pill rolling tremor bilaterally CRANIAL NERVES: II: PERRL. Visual fields full III, IV, VI: EOM intact, no gaze preference or deviation V: normal sensation bilaterally VII: no asymmetry VIII: normal hearing to speech IX, X: normal palatal elevation XI: head turn intact b/l XII: Tongue midline     MOTOR: Pt moves BUE  4/5, BL LE 3/5 - seem improved from prior exams? SENSORY: Normal to touch all 4 extremities   persistent rigidity in b/l legs Right greater than left--ongoing     Assessment/Plan: 1. Functional deficits which require 3+ hours  per day of interdisciplinary therapy in a comprehensive inpatient rehab setting. Physiatrist is providing close team supervision and 24 hour management of active medical problems listed below. Physiatrist and rehab team continue to assess barriers to discharge/monitor patient progress toward functional and medical goals  Care Tool:  Bathing    Body parts bathed by patient: Face, Right arm, Left arm, Chest   Body parts bathed by helper: Abdomen, Front perineal area, Buttocks, Right upper leg, Left upper leg, Right lower leg, Left lower leg     Bathing assist Assist Level: Maximal Assistance - Patient 24 - 49%     Upper Body Dressing/Undressing Upper body dressing   What is the patient wearing?: Pull over shirt    Upper body assist Assist Level: Maximal Assistance - Patient 25 - 49%    Lower Body Dressing/Undressing Lower body dressing      What is the patient wearing?: Pants, Incontinence brief     Lower body assist Assist for lower body dressing: Dependent - Patient 0%     Toileting Toileting    Toileting assist Assist for toileting: Total Assistance - Patient < 25%     Transfers Chair/bed transfer  Transfers assist     Chair/bed transfer assist level: Minimal Assistance - Patient > 75%     Locomotion Ambulation   Ambulation assist   Ambulation activity did not occur: Safety/medical concerns (decresed responsiveness, weakness, decreased balance, unable to respond  to simple 1 step commands)  Assist level: Minimal Assistance - Patient > 75% Assistive device: Walker-rolling Max distance: 72ft   Walk 10 feet activity   Assist  Walk 10 feet activity did not occur: Safety/medical concerns (decresed responsiveness, weakness, decreased balance, unable to respond to simple 1 step commands)  Assist level: Minimal Assistance - Patient > 75% Assistive device: Walker-rolling   Walk 50 feet activity   Assist Walk 50 feet with 2 turns activity did not occur:  Safety/medical concerns (decresed responsiveness, weakness, decreased balance, unable to respond to simple 1 step commands)         Walk 150 feet activity   Assist Walk 150 feet activity did not occur: Safety/medical concerns (decresed responsiveness, weakness, decreased balance, unable to respond to simple 1 step commands)         Walk 10 feet on uneven surface  activity   Assist Walk 10 feet on uneven surfaces activity did not occur: Safety/medical concerns (decresed responsiveness, weakness, decreased balance, unable to respond to simple 1 step commands)         Wheelchair     Assist Is the patient using a wheelchair?: Yes Type of Wheelchair: Manual (TIS WC)    Wheelchair assist level: Dependent - Patient 0% Max wheelchair distance: >364ft    Wheelchair 50 feet with 2 turns activity    Assist        Assist Level: Dependent - Patient 0%   Wheelchair 150 feet activity     Assist      Assist Level: Dependent - Patient 0%   Blood pressure (!) 145/91, pulse 64, temperature (!) 97.5 F (36.4 C), resp. rate 16, height 5' 9 (1.753 m), weight 114.5 kg, SpO2 93%.  Medical Problem List and Plan: 1. Functional deficits secondary to generalized weakness due to parkinson disease. Pt has DBS.              -patient may shower             -ELOS/Goals: 14-16, PT sup to CGA, OT mon/mod A, SLP sup to min A             11/30 -pt with worsening dysarthria and weakness this past week. Both HCT and EEG reviewed. No acute changes on CT and EEG demonstrates diffuse slowing. Neurology did not recommend any changes yesterday.    - I don't see any infectious, pharmaceutical, or metabolic reasons for presentation. Labs were ok yesterday   -likely progression of PD unfortunately   -continue to monitor, encourage OOB activity   -perhaps follow up with neurology this week   12/6: Delayed responses, short, but appropriate.  2.  Antithrombotics: -DVT/anticoagulation:   Pharmaceutical: continue Lovenox              -antiplatelet therapy: ASA  3. Episodes of blank stares: EEG ordered and is consistent with encephalopathy- no seizures evident  4. Mood/Behavior/Sleep: LCSW to follow for evaluation and support.              -antipsychotic agents: N/A  5. Neuropsych/cognition: This patient may be intermittent  capable of making decisions on his own behalf.  6. Skin/Wound Care: Routine pressure relief measures  7. Class 2 obesity: provided wife with a list of foods for weight loss, continue magnesium  supplement  8. PD s/p DBS/Autonomic dysfunction: Monitor for orthostatic symptoms with increase in activity             -continue sinemet  ER five times a day.  --Pimavanserin  daily  to  manage hallucinations  continue metanx Amantadine  100mg  daily started, increase to 8am and noon BID dosing  9. T2DM: Monitor BS ac/hs, d/c ISS to help get patient on home regimen, provided a list of foods that are good for diabetes             --continue Lantus  15 units Bid. Started metformin  500mg  daily with breakfast, decrease aspart to 2U TID   - 12/6: fair control  CBG (last 3)  Recent Labs    12/14/23 1630 12/14/23 2046 12/15/23 0601  GLUCAP 115* 131* 125*    Decrease CBGs to AC 10. HFrEF: Strict I/O. Daily weights and monitor for signs of overload.              --Low salt diet. Continue ASA, Lasix , Cozaar , Toprol  XL and aldactone . Asked nursing to please weigh him today, lasix  10mg  additional ordered 11/28 for slight increase in weight   - 12/6: weights stable Filed Weights   12/13/23 0419 12/14/23 0500 12/15/23 0500  Weight: 113.3 kg 114.7 kg 114.5 kg    11. Glaucoma: Managed with cosopt   12. Hyperactive airway disease: continue Breo.    13. Thrush: Continue nystatin  mouth wash  14. AKI: Decrease lasix  to 20mg  daily, asked nursing to please weigh patient, daily weights ordered, monitor creatinine at least weekly  15.  Thrombocytopenia: PC reviewed and is  stable   16. Urinary retention: d/c mirabegron . Will monitor voiding with PVR checks. Flomax  started after supper, continue   -12/6: PVRs low.   17. Constipation: LBM 12/4. d/c miralax , asked nursing to please offer prune juice, continue magnesium  oxide 200mg  daily, magnesium  level reviewed and is 2.2 on 12/4.   - LBM 12/5  18. Concern for seizures: monitor for signs/symptoms, EEG ordered and shows no evidence of seizures  ` - see #3  19. Spasticity/dystonia: patient follows with Dr. Carilyn for botox  outpatient  20. Mild vascular congestion with mild atelectasis on CXR: weaned off O2 during the day time, encouraged use of incentive spirometer. Discussed with patient/wife/therapy weaning O2 with therapy, decrease lasix  to 20mg  daily given worsening creatinine, incentive spirometer ordered, weight reviewed and is stable  - 12/6: 1 instance hypoxia this AM 89%, no oxygen use; monitor  LOS: 10 days A FACE TO FACE EVALUATION WAS PERFORMED  Henry [name] 12/15/2023, 8:51 AM

## 2023-12-16 ENCOUNTER — Inpatient Hospital Stay (HOSPITAL_COMMUNITY)

## 2023-12-16 DIAGNOSIS — Z95810 Presence of automatic (implantable) cardiac defibrillator: Secondary | ICD-10-CM | POA: Diagnosis not present

## 2023-12-16 DIAGNOSIS — R0989 Other specified symptoms and signs involving the circulatory and respiratory systems: Secondary | ICD-10-CM | POA: Diagnosis not present

## 2023-12-16 DIAGNOSIS — R0603 Acute respiratory distress: Secondary | ICD-10-CM | POA: Diagnosis not present

## 2023-12-16 DIAGNOSIS — J811 Chronic pulmonary edema: Secondary | ICD-10-CM | POA: Diagnosis not present

## 2023-12-16 LAB — GLUCOSE, CAPILLARY
Glucose-Capillary: 127 mg/dL — ABNORMAL HIGH (ref 70–99)
Glucose-Capillary: 147 mg/dL — ABNORMAL HIGH (ref 70–99)
Glucose-Capillary: 134 mg/dL — ABNORMAL HIGH (ref 70–99)
Glucose-Capillary: 142 mg/dL — ABNORMAL HIGH (ref 70–99)
Glucose-Capillary: 132 mg/dL — ABNORMAL HIGH (ref 70–99)

## 2023-12-16 MED ORDER — FUROSEMIDE 40 MG PO TABS
40.0000 mg | ORAL_TABLET | Freq: Every day | ORAL | Status: DC
  Administered 2023-12-17 – 2023-12-20 (×4): 40 mg via ORAL
  Filled 2023-12-16 (×4): qty 1

## 2023-12-16 MED ORDER — FUROSEMIDE 10 MG/ML IJ SOLN
20.0000 mg | Freq: Once | INTRAMUSCULAR | Status: AC
  Administered 2023-12-16: 20 mg via INTRAVENOUS
  Filled 2023-12-16: qty 2

## 2023-12-16 NOTE — Significant Event (Signed)
 Rapid Response Event Note   Reason for Call : Tachypnea   Initial Focused Assessment:  I was notified of pt's tachypnea. Pt is anxious, oriented to self but disoriented to time and place. He states he is in the wrong room. Pt is not in distress. BBS diminished with rales in bases. Will check PCXR. Skin warm pink and dry. Abd soft NT.   1933-97.12F, HR 63, 129/83 (96), RR 22 with sats 97% on RA. Pt was placed on 2L [place] per nursing staff.      Interventions:  MADELENE   MD Notified: per primary RN Call Time: 2038 Arrival Time: 2045 End Time: 2103  Griselda Alm ORN, RN

## 2023-12-16 NOTE — Plan of Care (Signed)
  Problem: RH BOWEL ELIMINATION Goal: RH STG MANAGE BOWEL WITH ASSISTANCE Description: STG Manage Bowel with supervision  Assistance. Outcome: Progressing   Problem: RH PAIN MANAGEMENT Goal: RH STG PAIN MANAGED AT OR BELOW PT'S PAIN GOAL Description: <4 w/ prns Outcome: Progressing   Problem: Consults Goal: RH GENERAL PATIENT EDUCATION Description: See Patient Education module for education specifics. Outcome: Not Progressing   Problem: RH BLADDER ELIMINATION Goal: RH STG MANAGE BLADDER WITH ASSISTANCE Description: STG Manage Bladder With supervision  Assistance Outcome: Not Progressing

## 2023-12-16 NOTE — Plan of Care (Signed)
  Problem: RH BOWEL ELIMINATION Goal: RH STG MANAGE BOWEL WITH ASSISTANCE Description: STG Manage Bowel with supervision  Assistance. Outcome: Progressing   Problem: RH SAFETY Goal: RH STG ADHERE TO SAFETY PRECAUTIONS W/ASSISTANCE/DEVICE Description: STG Adhere to Safety Precautions With supervision Assistance/Device. Outcome: Progressing   Problem: RH KNOWLEDGE DEFICIT GENERAL Goal: RH STG INCREASE KNOWLEDGE OF SELF CARE AFTER HOSPITALIZATION Description: Manage increase knowledge of self care after hospitalization with supervision assistance from family using educational materials provided Outcome: Progressing

## 2023-12-16 NOTE — Significant Event (Signed)
 Notified by nursing of RR for tachypnea and anxiety. RR22, otherwise vitals stable. Placed on 2 L [place] withy improvement.  CXR showing mild pulmonary edema.   Give 1x 20 mg IV lasix  tonight, increase AM lasix  PO to 40 mg daily.    Joesph JAYSON Likes, DO 12/16/2023

## 2023-12-16 NOTE — Progress Notes (Signed)
 Patient wheezing with increased respirations during rounds. Vitals stable. Prn oxygen was placed and breathing treatment given. Patient was able to answer orientation questions but kept on saying he is not in the right room. RR called to check on the patient. Chest xray ordered. MD notified. Patient spoke to spouse and RN also informed her about patient's situation. Lasix  given per MD order. Patient sleeping now 2325 and breathing has been regular. No distress at this time. Charge nurse aware. Will monitor.

## 2023-12-17 ENCOUNTER — Inpatient Hospital Stay (HOSPITAL_COMMUNITY)

## 2023-12-17 DIAGNOSIS — R0989 Other specified symptoms and signs involving the circulatory and respiratory systems: Secondary | ICD-10-CM | POA: Diagnosis not present

## 2023-12-17 DIAGNOSIS — J9601 Acute respiratory failure with hypoxia: Secondary | ICD-10-CM | POA: Insufficient documentation

## 2023-12-17 DIAGNOSIS — J90 Pleural effusion, not elsewhere classified: Secondary | ICD-10-CM | POA: Diagnosis not present

## 2023-12-17 DIAGNOSIS — Z95810 Presence of automatic (implantable) cardiac defibrillator: Secondary | ICD-10-CM | POA: Diagnosis not present

## 2023-12-17 DIAGNOSIS — E1122 Type 2 diabetes mellitus with diabetic chronic kidney disease: Secondary | ICD-10-CM

## 2023-12-17 DIAGNOSIS — R0902 Hypoxemia: Secondary | ICD-10-CM | POA: Diagnosis not present

## 2023-12-17 DIAGNOSIS — J45901 Unspecified asthma with (acute) exacerbation: Secondary | ICD-10-CM

## 2023-12-17 DIAGNOSIS — N1831 Chronic kidney disease, stage 3a: Secondary | ICD-10-CM

## 2023-12-17 LAB — RESP PANEL BY RT-PCR (RSV, FLU A&B, COVID)  RVPGX2
Influenza A by PCR: NEGATIVE
Influenza B by PCR: NEGATIVE
Resp Syncytial Virus by PCR: NEGATIVE
SARS Coronavirus 2 by RT PCR: NEGATIVE

## 2023-12-17 LAB — BASIC METABOLIC PANEL WITH GFR
Anion gap: 10 (ref 5–15)
BUN: 22 mg/dL (ref 8–23)
CO2: 32 mmol/L (ref 22–32)
Calcium: 8.5 mg/dL — ABNORMAL LOW (ref 8.9–10.3)
Chloride: 95 mmol/L — ABNORMAL LOW (ref 98–111)
Creatinine, Ser: 1.13 mg/dL (ref 0.61–1.24)
GFR, Estimated: >60 mL/min (ref >60)
Glucose, Bld: 122 mg/dL — ABNORMAL HIGH (ref 70–99)
Potassium: 4.1 mmol/L (ref 3.5–5.1)
Sodium: 137 mmol/L (ref 135–145)

## 2023-12-17 LAB — GLUCOSE, CAPILLARY
Glucose-Capillary: 115 mg/dL — ABNORMAL HIGH (ref 70–99)
Glucose-Capillary: 115 mg/dL — ABNORMAL HIGH (ref 70–99)
Glucose-Capillary: 100 mg/dL — ABNORMAL HIGH (ref 70–99)
Glucose-Capillary: 84 mg/dL (ref 70–99)

## 2023-12-17 LAB — BLOOD GAS, ARTERIAL
Acid-Base Excess: 8.6 mmol/L — ABNORMAL HIGH (ref 0.0–2.0)
Bicarbonate: 36.7 mmol/L — ABNORMAL HIGH (ref 20.0–28.0)
O2 Saturation: 96.9 %
Patient temperature: 36.8
pCO2 arterial: 64 mmHg — ABNORMAL HIGH (ref 32–48)
pH, Arterial: 7.36 (ref 7.35–7.45)
pO2, Arterial: 76 mmHg — ABNORMAL LOW (ref 83–108)

## 2023-12-17 LAB — URINALYSIS, ROUTINE W REFLEX MICROSCOPIC
Bilirubin Urine: NEGATIVE
Glucose, UA: NEGATIVE mg/dL
Hgb urine dipstick: NEGATIVE
Ketones, ur: 5 mg/dL — AB
Leukocytes,Ua: NEGATIVE
Nitrite: NEGATIVE
Protein, ur: NEGATIVE mg/dL
Specific Gravity, Urine: 1.017 (ref 1.005–1.030)
pH: 6 (ref 5.0–8.0)

## 2023-12-17 LAB — CBC
HCT: 40.5 % (ref 39.0–52.0)
Hemoglobin: 13.1 g/dL (ref 13.0–17.0)
MCH: 28.7 pg (ref 26.0–34.0)
MCHC: 32.3 g/dL (ref 30.0–36.0)
MCV: 88.8 fL (ref 80.0–100.0)
Platelets: 142 K/uL — ABNORMAL LOW (ref 150–400)
RBC: 4.56 MIL/uL (ref 4.22–5.81)
RDW: 13.9 % (ref 11.5–15.5)
WBC: 6.2 K/uL (ref 4.0–10.5)
nRBC: 0 % (ref 0.0–0.2)

## 2023-12-17 LAB — BRAIN NATRIURETIC PEPTIDE: B Natriuretic Peptide: 63 pg/mL (ref 0.0–100.0)

## 2023-12-17 MED ORDER — METHYLPREDNISOLONE SODIUM SUCC 125 MG IJ SOLR
125.0000 mg | Freq: Once | INTRAMUSCULAR | Status: AC
  Administered 2023-12-17: 125 mg via INTRAVENOUS
  Filled 2023-12-17: qty 2

## 2023-12-17 MED ORDER — IPRATROPIUM-ALBUTEROL 0.5-2.5 (3) MG/3ML IN SOLN
3.0000 mL | Freq: Four times a day (QID) | RESPIRATORY_TRACT | Status: DC
  Administered 2023-12-17 – 2023-12-20 (×10): 3 mL via RESPIRATORY_TRACT
  Filled 2023-12-17 (×9): qty 3

## 2023-12-17 MED ORDER — IPRATROPIUM-ALBUTEROL 0.5-2.5 (3) MG/3ML IN SOLN: 3.0000 mL | Freq: Four times a day (QID) | RESPIRATORY_TRACT | Status: DC

## 2023-12-17 MED ORDER — FUROSEMIDE 20 MG PO TABS
20.0000 mg | ORAL_TABLET | Freq: Once | ORAL | Status: AC
  Administered 2023-12-17: 20 mg via ORAL
  Filled 2023-12-17: qty 1

## 2023-12-17 MED ORDER — PREDNISONE 20 MG PO TABS
50.0000 mg | ORAL_TABLET | Freq: Every day | ORAL | Status: DC
  Administered 2023-12-18: 50 mg via ORAL
  Filled 2023-12-17: qty 1

## 2023-12-17 MED ORDER — INSULIN GLARGINE 100 UNIT/ML ~~LOC~~ SOLN
14.0000 [IU] | Freq: Every day | SUBCUTANEOUS | Status: DC
  Administered 2023-12-17 – 2023-12-19 (×3): 14 [IU] via SUBCUTANEOUS
  Filled 2023-12-17 (×4): qty 0.14

## 2023-12-17 NOTE — Progress Notes (Addendum)
 PROGRESS NOTE   Subjective/Complaints: MEWS 0 but patient with desaturation and wheezing this morning, lasix  60mg  given this morning Denies pain    12/17/2023    4:00 AM 12/16/2023    7:33 PM 12/16/2023    4:27 PM  Vitals with BMI  Weight 254 lbs 7 oz    BMI 37.55    Systolic 121 129 883  Diastolic 73 83 68  Pulse 59 63 60    Recent Labs    12/16/23 1625 12/16/23 2121 12/17/23 0541  GLUCAP 142* 132* 115*     ROS: +diffuse weakness, denies pain,  +urinary urgency, improving ambulation with therapy, +desaturation with therapy  Objective:   DG Chest Port 1 View Result Date: 12/16/2023 EXAM: 1 VIEW XRAY OF THE CHEST 12/16/2023 09:17:00 PM COMPARISON: 12/06/2023 CLINICAL HISTORY: Acute respiratory distress FINDINGS: LINES, TUBES AND DEVICES: Neural stimulator over right chest. Left chest pacemaker/ICD noted. LUNGS AND PLEURA: Low lung volumes. Mild pulmonary edema. Elevated right hemidiaphragm. Bibasilar atelectasis. No pleural effusion. No pneumothorax. HEART AND MEDIASTINUM: Cardiomegaly. Post median sternotomy and CABG. unchanged cardiomediastinal silhouette. BONES AND SOFT TISSUES: Post median sternotomy and CABG. Neural stimulator over right chest. Left chest pacemaker/ICD noted. No acute osseous abnormality. IMPRESSION: 1. Mild pulmonary edema. 2. Low lung volumes. Electronically signed by: Kate Plummer MD 12/16/2023 09:22 PM EST RP Workstation: [id number]    Recent Labs    12/17/23 0623  WBC 6.2  HGB 13.1  HCT 40.5  PLT 142*   Recent Labs    12/17/23 0623  NA 137  K 4.1  CL 95*  CO2 32  GLUCOSE 122*  BUN 22  CREATININE 1.13  CALCIUM  8.5*    Intake/Output Summary (Last 24 hours) at 12/17/2023 1112 Last data filed at 12/17/2023 0844 Gross per 24 hour  Intake 358 ml  Output 700 ml  Net -342 ml         Physical Exam: Vital Signs Blood pressure 121/73, pulse (!) 59, temperature 97.7 F (36.5 C),  temperature source Oral, resp. rate 20, height 5' 9 (1.753 m), weight 115.4 kg, SpO2 97%. Constitutional: No distress . Vital signs reviewed. obese HEENT: NCAT, EOMI, oral membranes moist Neck: supple Cardiovascular: Bradycardia Respiratory/Chest: wheezing more today GI/Abdomen: BS +, non-tender, non-distended Ext: no clubbing, cyanosis, or edema Psych: flat but cooperative  Skin: Bruising noted on bilateral upper extremities--stable Neuro:     Mental Status: AAOx to person and place; partially to time (month as November)  + memory deficits present, intermittant confusion, delayed responses,   Speech/Languate:  severe dysarthria  follows simple commands. Pill rolling tremor bilaterally CRANIAL NERVES: II: PERRL. Visual fields full III, IV, VI: EOM intact, no gaze preference or deviation V: normal sensation bilaterally VII: no asymmetry VIII: normal hearing to speech IX, X: normal palatal elevation XI: head turn intact b/l XII: Tongue midline     MOTOR: Pt moves BUE  4/5, BL LE 3/5 - seem improved from prior exams? SENSORY: Normal to touch all 4 extremities   persistent rigidity in b/l legs Right greater than left--ongoing     Assessment/Plan: 1. Functional deficits which require 3+ hours per day of interdisciplinary therapy in a comprehensive inpatient  rehab setting. Physiatrist is providing close team supervision and 24 hour management of active medical problems listed below. Physiatrist and rehab team continue to assess barriers to discharge/monitor patient progress toward functional and medical goals  Care Tool:  Bathing    Body parts bathed by patient: Face, Right arm, Left arm, Chest   Body parts bathed by helper: Abdomen, Front perineal area, Buttocks, Right upper leg, Left upper leg, Right lower leg, Left lower leg     Bathing assist Assist Level: Maximal Assistance - Patient 24 - 49%     Upper Body Dressing/Undressing Upper body dressing   What is the  patient wearing?: Pull over shirt    Upper body assist Assist Level: Maximal Assistance - Patient 25 - 49%    Lower Body Dressing/Undressing Lower body dressing      What is the patient wearing?: Pants, Incontinence brief     Lower body assist Assist for lower body dressing: Dependent - Patient 0%     Toileting Toileting    Toileting assist Assist for toileting: Total Assistance - Patient < 25%     Transfers Chair/bed transfer  Transfers assist     Chair/bed transfer assist level: Minimal Assistance - Patient > 75%     Locomotion Ambulation   Ambulation assist   Ambulation activity did not occur: Safety/medical concerns (decresed responsiveness, weakness, decreased balance, unable to respond to simple 1 step commands)  Assist level: Minimal Assistance - Patient > 75% Assistive device: Walker-rolling Max distance: 47ft   Walk 10 feet activity   Assist  Walk 10 feet activity did not occur: Safety/medical concerns (decresed responsiveness, weakness, decreased balance, unable to respond to simple 1 step commands)  Assist level: Minimal Assistance - Patient > 75% Assistive device: Walker-rolling   Walk 50 feet activity   Assist Walk 50 feet with 2 turns activity did not occur: Safety/medical concerns (decresed responsiveness, weakness, decreased balance, unable to respond to simple 1 step commands)         Walk 150 feet activity   Assist Walk 150 feet activity did not occur: Safety/medical concerns (decresed responsiveness, weakness, decreased balance, unable to respond to simple 1 step commands)         Walk 10 feet on uneven surface  activity   Assist Walk 10 feet on uneven surfaces activity did not occur: Safety/medical concerns (decresed responsiveness, weakness, decreased balance, unable to respond to simple 1 step commands)         Wheelchair     Assist Is the patient using a wheelchair?: Yes Type of Wheelchair: Manual (TIS WC)     Wheelchair assist level: Dependent - Patient 0% Max wheelchair distance: >38ft    Wheelchair 50 feet with 2 turns activity    Assist        Assist Level: Dependent - Patient 0%   Wheelchair 150 feet activity     Assist      Assist Level: Dependent - Patient 0%   Blood pressure 121/73, pulse (!) 59, temperature 97.7 F (36.5 C), temperature source Oral, resp. rate 20, height 5' 9 (1.753 m), weight 115.4 kg, SpO2 97%.  Medical Problem List and Plan: 1. Functional deficits secondary to generalized weakness due to parkinson disease. Pt has DBS.              -patient may shower             -ELOS/Goals: 14-16, PT sup to CGA, OT mon/mod A, SLP sup to min A  11/30 -pt with worsening dysarthria and weakness this past week. Both HCT and EEG reviewed. No acute changes on CT and EEG demonstrates diffuse slowing. Neurology did not recommend any changes yesterday.    - I don't see any infectious, pharmaceutical, or metabolic reasons for presentation. Labs were ok yesterday   -likely progression of PD unfortunately   -continue to monitor, encourage OOB activity   -perhaps follow up with neurology this week   12/6: Delayed responses, short, but appropriate.  2.  Antithrombotics: -DVT/anticoagulation:  Pharmaceutical: continue Lovenox              -antiplatelet therapy: ASA  3. Episodes of blank stares: EEG ordered and is consistent with encephalopathy- no seizures evident  4. Mood/Behavior/Sleep: LCSW to follow for evaluation and support.              -antipsychotic agents: N/A  5. Neuropsych/cognition: This patient may be intermittent  capable of making decisions on his own behalf.  6. Skin/Wound Care: Routine pressure relief measures  7. Class 2 obesity: provided wife with a list of foods for weight loss, continue magnesium  supplement  8. PD s/p DBS/Autonomic dysfunction: Monitor for orthostatic symptoms with increase in activity             -continue  sinemet  ER five times a day.  --Pimavanserin  daily to  manage hallucinations  continue metanx Amantadine  100mg  daily started, increase to 8am and noon BID dosing  9. T2DM: Monitor BS ac/hs, d/c ISS to help get patient on home regimen, provided a list of foods that are good for diabetes             --continue Lantus  15 units Bid. Started metformin  500mg  daily with breakfast, decrease aspart to 2U TID   - 12/6: fair control  CBG (last 3)  Recent Labs    12/16/23 1625 12/16/23 2121 12/17/23 0541  GLUCAP 142* 132* 115*    Decrease CBGs to AC 10. HFrEF: Strict I/O. Daily weights and monitor for signs of overload.              --Low salt diet. Continue ASA, Lasix , Cozaar , Toprol  XL and aldactone . Asked nursing to please weigh him today, lasix  10mg  additional ordered 11/28 for slight increase in weight   - 12/6: weights stable Filed Weights   12/15/23 0500 12/16/23 0455 12/17/23 0400  Weight: 114.5 kg 115.4 kg 115.4 kg    11. Glaucoma: Managed with cosopt   12. Hyperactive airway disease: continue Breo.   13. Thrush: Continue nystatin  mouth wash  14. AKI: resolved, increase daily lasix  back to 40mg  daily  15.  Thrombocytopenia: PC reviewed and is stable   16. Urinary retention: d/c mirabegron . Will monitor voiding with PVR checks. D/c flomax  given dizziness  17. Constipation: LBM 12/7. d/c miralax , asked nursing to please offer prune juice, continue magnesium  oxide 200mg  daily, magnesium  level reviewed and is 2.2 on 12/4.   - LBM 12/6- messaged nursing to confirm accuract  18. Concern for seizures: monitor for signs/symptoms, EEG ordered and shows no evidence of seizures  ` - see #3  19. Spasticity/dystonia: patient follows with Dr. Carilyn for botox  outpatient  20. Mild vascular congestion and pulmonary edema with mild atelectasis on CXR: encouraged use of incentive spirometer. Discussed with patient/wife/therapy weaning O2 with therapy, incentive spirometer ordered, weight  reviewed and is stable, continue O2 prn, lasix  60mg  ordered 12/8  21. Dizziness: flomax  d/ced  22. Concern for UTI from family: UA/UC ordered  LOS: 12 days A  FACE TO FACE EVALUATION WAS PERFORMED  Theoplis Garciagarcia P Natsumi Whitsitt 12/17/2023, 11:12 AM

## 2023-12-17 NOTE — Progress Notes (Signed)
 Physical Therapy Session Note  Patient Details  Name: Henry [name] MRN: [id number] Date of Birth: Nov 18, 1950  Today's Date: 12/17/2023 PT Individual Time: 1000-1040 PT Individual Time Calculation (min): 40 min  and Today's Date: 12/17/2023 PT Missed Time: 20 Minutes Missed Time Reason: Patient fatigue (dizziness)  Short Term Goals: Week 2:  PT Short Term Goal 1 (Week 2): pt will transfer supine<>sitting EOB with mod A of 1 PT Short Term Goal 2 (Week 2): pt will transfer sit<>stand with LRAD and min A consistantly PT Short Term Goal 3 (Week 2): pt will propel WC 26ft with CGA  Skilled Therapeutic Interventions/Progress Updates:      Pt seated in WC upon arrival. Pt agreeable to therapy. Pt denies any pain.   Pt endorsing feeling dizzyheaded at start of session while seated in WC. BP 138/80 HR 61 1L SpO2 96. Notified MD.   Pt requesting to get back in bed.   Nurse notified PT of need for urine analysis. PT encouraging pt to try to urinate during session prior to getting in bed.   Pt required mod A for sit to stand on steady and only able to tolerate standing for short increments prior to needing to sit.   Sit to supine with max A, for management of trunk and B LE.   Pt supine in bed with all needs within reach and bed alarm on.   Pt missed 20 minutes 2/2 fatigue/dizziness. Will attempt to make up missed minutes as able.         Therapy Documentation Precautions:  Precautions Precautions: Fall Recall of Precautions/Restrictions: Impaired Precaution/Restrictions Comments: Parkinson's, deep brain stimulator, R ear deaf Restrictions Weight Bearing Restrictions Per Provider Order: No   Therapy/Group: Individual Therapy  [facility] [place], [place], DPT  12/17/2023, 7:52 AM

## 2023-12-17 NOTE — Progress Notes (Signed)
 Physical Therapy Session Note  Patient Details  Name: Henry [name] MRN: [id number] Date of Birth: 1950-06-17  Today's Date: 12/17/2023 PT Individual Time: 0801-0843 PT Individual Time Calculation (min): 42 min   Short Term Goals: Week 1:  PT Short Term Goal 1 (Week 1): pt will transfer supine<>sitting EOB with mod A of 1 PT Short Term Goal 1 - Progress (Week 1): Progressing toward goal PT Short Term Goal 2 (Week 1): pt will transfer bed<>chair with LRAD and max A of 1 PT Short Term Goal 2 - Progress (Week 1): Met PT Short Term Goal 3 (Week 1): pt will transfer sit<>stand with max A of 1 PT Short Term Goal 3 - Progress (Week 1): Met Week 2:  PT Short Term Goal 1 (Week 2): pt will transfer supine<>sitting EOB with mod A of 1 PT Short Term Goal 2 (Week 2): pt will transfer sit<>stand with LRAD and min A consistantly PT Short Term Goal 3 (Week 2): pt will propel WC 12ft with CGA  Skilled Therapeutic Interventions/Progress Updates:   Received pt semi-reclined in bed eager to get OOB. Pt did not c/o pain during session but with audible wheezing - RN notified. Removed SPO2 and SPO2 dropped to 87% on RA but increased to 94% on 1L - remained on 1L throughout session. Pt slightly more disoriented this morning (unaware of where he was) and with more incomprehensible speech - treatment team notified. Session with emphasis on functional mobility/transfers, dressing, generalized strengthening and endurance, and dynamic standing balance/coordination. Pt transferred semi-reclined<>sitting R EOB with HOB elevated and max A for LE management and trunk control (continues to require hand over hand assist for rolling). Stood from EOB multiple times with RW and min/mod A due to posterior bias (usually min A) while NT removed purewick. Purewick leaked all over brief and floor - donned clean brief, pants, socks, and shoes with max A and pt stood with min A and required max A to pull pants over hips. Performed  stand<>pivot into TIS WC with RW and heavy mod A this morning - noted increased difficulty with sequencing transfer and attempting to sit before reaching WC, requiring max A to safely pivot hips to chair. Sat in WC at sink and washed face and brushed teeth with min A and cues for initiation. Concluded session with pt sitting in TIS WC, needs within reach, and seatbelt alarm on.   Therapy Documentation Precautions:  Precautions Precautions: Fall Recall of Precautions/Restrictions: Impaired Precaution/Restrictions Comments: Parkinson's, deep brain stimulator, R ear deaf Restrictions Weight Bearing Restrictions Per Provider Order: No  Therapy/Group: Individual Therapy [name] [name] PT, DPT 12/17/2023, 6:36 AM

## 2023-12-17 NOTE — Progress Notes (Signed)
 Reported to be difficult to arouse after therapy. Patient has been lethargic today--labs show improvement in renal status. WBC stable. Required oxygen last night with diuresis due to concerns of overload. Down to 70's on RA and on placed on oxygen was able to recover to 88-90's. UA negative. CXR ordered to rule out infection. Noted to have tremors?jerks? Will order ABG to rule out hypercarbia. Will consult hospitalist for assistance with management.

## 2023-12-17 NOTE — Progress Notes (Signed)
 Occupational Therapy Session Note  Patient Details  Name: Henry [name] MRN: [id number] Date of Birth: 1950-12-16  Today's Date: 12/17/2023 OT Individual Time: 1300-1345 OT Individual Time Calculation (min): 45 min    Short Term Goals: Week 1:  OT Short Term Goal 1 (Week 1): Pt will complete UB bathing with Mod A. OT Short Term Goal 1 - Progress (Week 1): Progressing toward goal OT Short Term Goal 2 (Week 1): Pt will complete UB dressing with Mod A. OT Short Term Goal 2 - Progress (Week 1): Progressing toward goal OT Short Term Goal 3 (Week 1): Pt will complete step pivot transfer from bed <> WC with Mod Ax1 and RW if needed. OT Short Term Goal 3 - Progress (Week 1): Met OT Short Term Goal 2 (Week 2): Pt will complete UB dressing with Mod A. OT Short Term Goal 3 (Week 2): Patient will complete toilet transfer with min assist  Skilled Therapeutic Interventions/Progress Updates:    Pt received in bed with 1 L O2 on.  O2 sats 94% so removed for 10 min to see how pt could tolerate activity without it.  His sats on room air only 85%.  Reapplied oxygen.   Pt very difficult to understand but I was able to understand he wanted to wash up and shower.   Because of shorter session time, suggested he do a wash up at the sink.   -supine to sit mod A -stand pivot bed to wc with R hand on bed rail and L on wc arm rest for support mod A. Initially tried to only use one hand for support but pt leaning back and transfer did not feel safe.  -positioned at sink to brush teeth with set up -having more difficulty today with doffing and donning shirt needing mod A -UB bathing min A -sit to stands at the sink 3x with min/mod A, but then min/mod to maintain balance once up -pt needed to toilet prior to washing LB,  due to time opted to use stedy that was in the room. -stedy difficult to manage with wide base of his wc and the pads were difficulty to move around grab bar in bathroom, so he will be best with  using grab bar to pivot.   -therapist adjusted his pants down and pt sat on toilet. Pt needed time to sit and void.  Because session time was over, a nurse on the unit offered to come in and assist him.    Therapy Documentation Precautions:  Precautions Precautions: Fall Recall of Precautions/Restrictions: Impaired Precaution/Restrictions Comments: Parkinson's, deep brain stimulator, R ear deaf Restrictions Weight Bearing Restrictions Per Provider Order: No General: General PT Missed Treatment Reason: Patient fatigue (dizziness) Vital Signs: Therapy Vitals Temp: 98.2 F (36.8 C) Temp Source: Oral Pulse Rate: (!) 59 Resp: 17 BP: 134/73 Patient Position (if appropriate): Sitting Oxygen Therapy SpO2: 98 % O2 Device: Room Air Pain: Pain Assessment Pain Scale: 0-10 Pain Score: 0-No pain ADL: ADL Eating: Moderate assistance (using built up foam. provided during eval) Where Assessed-Eating: Wheelchair Grooming: Minimal assistance Where Assessed-Grooming: Sitting at sink Upper Body Bathing: Maximal assistance Where Assessed-Upper Body Bathing: Sitting at sink Lower Body Bathing: Dependent Where Assessed-Lower Body Bathing: Sitting at sink Upper Body Dressing: Maximal assistance Where Assessed-Upper Body Dressing: Wheelchair Lower Body Dressing: Dependent Where Assessed-Lower Body Dressing: Wheelchair Toileting: Dependent Where Assessed-Toileting: Bed level Toilet Transfer: Dependent Toilet Transfer Method: Other (comment) (stedy) Tub/Shower Transfer: Not assessed Film/video Editor: Not assessed  Therapy/Group: Individual Therapy  Egon Dittus 12/17/2023, 3:41 PM

## 2023-12-17 NOTE — Consult Note (Signed)
 Initial Consultation Note   Patient: Henry [name] FMW:[id number] DOB: 1950-05-05 PCP: Cleatus Arlyss RAMAN, MD DOA: 12/05/2023 DOS: the patient was seen and examined on 12/17/2023 Primary service: Lorilee Sven SQUIBB, MD  Referring physician: Lorilee Sven SQUIBB, MD Reason for consult: Wheezing, intermittent hypoxia, lethargy  HPI/Course: Henry [name] is a 73 y.o. male with medical history significant of hypertension, hyperlipidemia, diabetes, hypothyroidism, CAD status post CABG, GERD, gout, CKD 3, chronic systolic CHF, status post AICD, RLS, Parkinson disease, status post deep brain stimulator, neuropathy, asthma, OSA, obesity who presented to rehab after inpatient stay.  Patient was admitted 11/18-11/26.  Initially came in with weakness for 2 to 3 weeks with episodes of facial asymmetry.  Symptoms appear to be worse after changing settings of his deep brain stimulator and neurology office.  Patient was worked up and evaluated by neurology while admitted.  CT head remained stable while admitted and unable to obtain MRI brain due to deep brain stimulator.  Patient was worked up for seizure and EEG was negative for any evidence of seizure.  It is thought that his presenting symptoms are likely due to accommodation of exacerbation of his Parkinson's disease, change in the brain stimulator settings, deconditioning.  PT recommended CIR upon discharge.  Patient has been at [facility] since 11/26.  Recently has developed some issues with shortness of breath, wheezing.  Did require 2 L during the day yesterday.  Chest x-ray 12/7: Mild pulmonary edema.  Patient also received albuterol  nebulizer.  An additional dose of Lasix .  Patient again having wheezing, lethargy.  Sent for repeat chest x-ray.  Vital signs today notable for heart rate in the 50s-60s.  Require oxygen at night and required 02 yesterday and today during the day.  Lab workup included BMP with chloride 95, glucose 127, calcium  8.5.  CBC with  platelets 142.  Urinalysis with ketones only.  Urine culture pending.  Repeat chest x-ray pending.  ABG ordered and is pending.  Review of Systems: As per HPI otherwise all other systems reviewed and are negative.  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)   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   Dysrhythmia    a-fib   GERD (gastroesophageal reflux disease)    Glaucoma    right eye   History of kidney stones    multiple   HLD (hyperlipidemia)    HTN (hypertension)    pt denies 08/19/12   Hyperplasia, prostate    Hypothyroidism    MI (myocardial infarction) (HCC)    Dr Pietro 2000, x3vessels bypass   Neuromuscular disorder (HCC)    Parkinson's Disease   OSA (obstructive sleep apnea)    AHI-28,on CPAP, noncompliant with CPAP   Parkinson disease (HCC)    1999   PONV (postoperative nausea and vomiting)    Restless legs    Shortness of breath    Hx: of at all times   UTI (lower urinary tract infection) 09/15/2012   Klebsiella   Ventral hernia  Walker as ambulation aid    also uses wheelchair at home/when going out    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   BIOPSY  10/14/2018   Procedure: BIOPSY;  Surgeon: Albertus Gordy HERO, MD;  Location: [facility];  Service: Gastroenterology;;   CATARACT EXTRACTION W/ INTRAOCULAR LENS IMPLANT     Hx: of right eye   CATARACT EXTRACTION W/ INTRAOCULAR LENS IMPLANT Left 2018   COLONOSCOPY N/A 10/13/2014   Procedure: COLONOSCOPY;  Surgeon: Gordy HERO Albertus, MD;  Location: [facility];  Service:  Gastroenterology;  Laterality: N/A;   COLONOSCOPY W/ BIOPSIES AND POLYPECTOMY     Hx: of   COLONOSCOPY WITH PROPOFOL  N/A 10/14/2018   Procedure: COLONOSCOPY WITH PROPOFOL ;  Surgeon: Albertus Gordy HERO, MD;  Location: [facility];  Service: Gastroenterology;  Laterality: N/A;   CORONARY ARTERY BYPASS GRAFT  2000   Sudie Laine, MD   CORONARY STENT PLACEMENT  1998   DEEP BRAIN STIMULATOR PLACEMENT  2004   Right and left VIN stimulator placement (parkinsons)   EYE SURGERY     Cataract removed in 1980s related to wood in eye when 73 years old   FINGER AMPUTATION     left pointer   IMPLANTABLE CARDIOVERTER DEFIBRILLATOR IMPLANT N/A 11/13/2013   Procedure: IMPLANTABLE CARDIOVERTER DEFIBRILLATOR IMPLANT;  Surgeon: Danelle LELON Birmingham, MD;  Location: [facility] CATH LAB;  Service: Cardiovascular;  Laterality: N/A;   INSERT / REPLACE / REMOVE PACEMAKER     medtronic   LEFT AND RIGHT HEART CATHETERIZATION WITH CORONARY ANGIOGRAM N/A 09/24/2013   Procedure: LEFT AND RIGHT HEART CATHETERIZATION WITH CORONARY ANGIOGRAM;  Surgeon: Lonni JONETTA Cash, MD;  Location: [facility] CATH LAB;  Service: Cardiovascular;  Laterality: N/A;   LITHOTRIPSY     3 different times   POLYPECTOMY  10/14/2018   Procedure: POLYPECTOMY;  Surgeon: Albertus Gordy HERO, MD;  Location: [facility];  Service: Gastroenterology;;   PULSE GENERATOR IMPLANT Right 11/13/2017   Procedure: Right chest implantable pulse generator change;  Surgeon: Unice Pac, MD;  Location: [facility] OR;  Service: Neurosurgery;  Laterality: Right;  Right chest implantable pulse generator change   SUBTHALAMIC STIMULATOR BATTERY REPLACEMENT N/A 09/05/2012   Procedure: Deep brain stimulator battery change;  Surgeon: Pac Unice, MD;  Location: [facility];  Service: Neurosurgery;  Laterality: N/A;  Deep brain stimulator battery change   SUBTHALAMIC STIMULATOR BATTERY REPLACEMENT N/A 06/10/2015   Procedure: Deep Brain stimulator battery change;  Surgeon: Pac Unice, MD;  Location:  [facility];  Service: Neurosurgery;  Laterality: N/A;   SUBTHALAMIC STIMULATOR BATTERY REPLACEMENT Right 09/25/2019   Procedure: Deep brain stimulator battery change;  Surgeon: Unice Pac, MD;  Location: [facility] OR;  Service: Neurosurgery;  Laterality: Right;   SUBTHALAMIC STIMULATOR BATTERY REPLACEMENT Right 12/15/2022   Procedure: IMPLANTABLE PULSE GENERATOR  BATTERY REPLACEMENT;  Surgeon: Carollee Lani BROCKS, DO;  Location: [facility] OR;  Service: Neurosurgery;  Laterality: Right;  3C   TONSILLECTOMY      Social History  reports that he has never smoked. He has never been exposed to tobacco smoke. He has never used smokeless tobacco. He reports that he does not currently use alcohol. He reports that he does not use drugs.  Allergies  Allergen Reactions   Penicillins Anaphylaxis and Rash    Because of a history of documented adverse serious drug reaction;Medi Alert bracelet  is recommended PATIENT HAS HAD A PCN REACTION WITH IMMEDIATE RASH, FACIAL/TONGUE/THROAT SWELLING,  SOB, OR LIGHTHEADEDNESS WITH HYPOTENSION:  #  #  YES  #  #  Has patient had a PCN reaction causing severe rash involving mucus membranes or skin necrosis: unknown Has patient had a PCN reaction that required hospitalization NO Has patient had a PCN reaction occurring within the last 10 years: NO   Klonopin  [Clonazepam ] Other (See Comments)    agitation   Peanut-Containing Drug Products Cough   Watermelon [Citrullus Vulgaris] Other (See Comments) and Cough    Tickle in throat    Family History  Problem Relation Age of Onset   Aneurysm Mother    Alcoholism Father    HIV/AIDS Brother 58       AIDS   Healthy Sister    Healthy Child    Peripheral vascular disease Other    Arthritis Other    Healthy Sister    Healthy Child    Healthy Child    Diabetes Neg Hx    Heart disease Neg Hx    Colon cancer Neg Hx    Prostate cancer Neg Hx   reviewed  Prior to Admission medications   Medication Sig Start Date End Date Taking?  Authorizing Provider  acetaminophen  (TYLENOL ) 325 MG tablet Take 2 tablets (650 mg total) by mouth every 6 (six) hours as needed for mild pain (pain score 1-3), fever or headache (or Fever >/= 101). 12/05/23   Pokhrel, Vernal, MD  albuterol  (VENTOLIN  HFA) 108 (90 Base) MCG/ACT inhaler TAKE 2 PUFFS BY MOUTH EVERY 6 HOURS AS NEEDED FOR WHEEZE OR SHORTNESS OF BREATH 12/24/22   Cleatus Arlyss RAMAN, MD  allopurinol  (ZYLOPRIM ) 100 MG tablet Take 1.5 tablets (150 mg total) by mouth daily. 05/15/23   Cleatus Arlyss RAMAN, MD  aspirin  EC 81 MG tablet Take 1 tablet (81 mg total) by mouth daily. Swallow whole. 12/22/22   Dawley, Troy C, DO  Carbidopa -Levodopa  ER (SINEMET  CR) 25-100 MG tablet controlled release TAKE 2 TABLETS BY MOUTH 5 TIMES PER DAY AS DIRECTED 11/23/23   Tat, Asberry RAMAN, DO  Cholecalciferol  (VITAMIN D ) 50 MCG (2000 UT) tablet Take 2,000 Units by mouth every evening.    [provider]  Continuous Blood Gluc Receiver (DEXCOM G6 RECEIVER) DEVI Use to check blood sugar daily 02/24/21   Von Pacific, MD  Continuous Glucose Sensor (DEXCOM G6 SENSOR) MISC USE TO CHECK BLOOD SUGAR DAILY 01/30/23   Thapa, Sudan, MD  Continuous Glucose Transmitter (DEXCOM G6 TRANSMITTER) MISC Change every 3 months 01/30/23   Thapa, Sudan, MD  dorzolamide -timolol  (COSOPT ) 22.3-6.8 MG/ML ophthalmic solution Place 1 drop into the right eye 2 (two) times daily. 08/29/19   [provider]  DULoxetine  (CYMBALTA ) 20 MG capsule Take 1 capsule (20 mg total) by mouth daily. 12/05/23   Pokhrel, Laxman, MD  enoxaparin  (LOVENOX ) 60 MG/0.6ML injection Inject 0.5 mLs (50 mg total) into the skin daily. 12/05/23   Pokhrel, Laxman, MD  fluticasone -salmeterol (WIXELA INHUB) 250-50 MCG/ACT AEPB INHALE 1 PUFF INTO THE LUNGS IN THE MORNING AND AT BEDTIME**RINSE AFTER USE** Patient taking differently: Inhale 1 puff into the lungs daily as needed (for shortness of breath). 12/24/22   Cleatus Arlyss RAMAN, MD  FREESTYLE LITE test strip USE TO  CHECK BLOOD SUGAR 4 TIMES DAILY. 04/13/20   Von Pacific, MD  furosemide  (LASIX ) 20 MG tablet Take 2 tablets (40 mg total) by mouth daily. 12/05/23   Pokhrel, Vernal, MD  icosapent  Ethyl (VASCEPA ) 1 g capsule Take 2 capsules (2 g total) by mouth 2 (two) times  daily. 01/30/23   Thapa, Sudan, MD  insulin  aspart (NOVOLOG ) 100 UNIT/ML injection Inject 0-5 Units into the skin at bedtime. 12/05/23   Pokhrel, Laxman, MD  insulin  aspart (NOVOLOG ) 100 UNIT/ML injection Inject 4 Units into the skin 3 (three) times daily with meals. 12/05/23   Pokhrel, Laxman, MD  insulin  aspart (NOVOLOG ) 100 UNIT/ML injection Inject 0-9 Units into the skin 3 (three) times daily with meals. 12/05/23   Pokhrel, Vernal, MD  Insulin  Disposable Pump (OMNIPOD 5 DEXG7G6 PODS GEN 5) MISC CHANGE POD EVERY 3 DAYS 08/27/23   Thapa, Sudan, MD  Insulin  Disposable Pump (OMNIPOD 5 G6 INTRO, GEN 5,) KIT 1 kit by Does not apply route daily. 02/24/21   Von Pacific, MD  insulin  glargine-yfgn (SEMGLEE ) 100 UNIT/ML injection Inject 0.15 mLs (15 Units total) into the skin at bedtime. 12/05/23   Pokhrel, Laxman, MD  insulin  lispro (HUMALOG ) 100 UNIT/ML injection USE MAXIMUM 76 UNITS PER   DAY WITH V-GO PUMP 01/30/23   Thapa, Sudan, MD  levothyroxine  (SYNTHROID ) 150 MCG tablet TAKE 1 TABLET BY MOUTH DAILY BEFORE BREAKFAST. 09/03/23   Thapa, Sudan, MD  losartan  (COZAAR ) 25 MG tablet Take 0.5 tablets (12.5 mg total) by mouth daily. 12/05/23   Pokhrel, Laxman, MD  melatonin 3 MG TABS tablet Take 1 tablet (3 mg total) by mouth at bedtime as needed. 12/05/23   Pokhrel, Laxman, MD  metoprolol  succinate (TOPROL -XL) 25 MG 24 hr tablet Take 1 tablet (25 mg total) by mouth daily. Take with or immediately following a meal. 12/05/23   Pokhrel, Vernal, MD  mirabegron  ER (MYRBETRIQ ) 25 MG TB24 tablet Take 1 tablet (25 mg total) by mouth daily. 08/22/23   Cleatus Arlyss RAMAN, MD  nitroGLYCERIN  (NITROSTAT ) 0.4 MG SL tablet Place 1 tablet (0.4 mg total) under the tongue every 5  (five) minutes as needed for chest pain. 07/18/22 11/27/23  Pietro Redell RAMAN, MD  NUPLAZID  34 MG CAPS TAKE 1 CAPSULE BY MOUTH 1 TIME A DAY 12/12/23   Tat, Asberry RAMAN, DO  OVER THE COUNTER MEDICATION Apply 1 application topically daily as needed (pain). Theraworx Pain Cream    [provider]  polyethylene glycol (MIRALAX  / GLYCOLAX ) packet Take 8.5 g by mouth daily.    [provider]  potassium chloride  SA (KLOR-CON  M) 20 MEQ tablet TAKE 40 MEQ BY MOUTH IN THE MORNING AND TAKE 20 MEQ IN THE EVENING 05/16/23   Cleatus Arlyss RAMAN, MD  rosuvastatin  (CRESTOR ) 20 MG tablet TAKE 1 TAB BY MOUTH EVERYDAY AT BEDTIME 01/30/23   Thapa, Sudan, MD  sennosides-docusate sodium  (SENOKOT-S) 8.6-50 MG tablet Take 1 tablet by mouth daily as needed for constipation. Take along with miralax .    [provider]  spironolactone  (ALDACTONE ) 25 MG tablet TAKE 1/2 TABLET BY MOUTH EVERY DAY 05/15/23   Pietro Redell RAMAN, MD  tirzepatide  (MOUNJARO ) 7.5 MG/0.5ML Pen Inject 7.5 mg into the skin once a week. 01/30/23   Thapa, Sudan, MD  vitamin B-12 (CYANOCOBALAMIN ) 1000 MCG tablet Take 1,000 mcg by mouth daily.    [provider]    Physical Exam: Vitals:   12/17/23 0341 12/17/23 0400 12/17/23 1253 12/17/23 1710  BP:  121/73 134/73 119/63  Pulse:  (!) 59 (!) 59 (!) 59  Resp:  20 17   Temp:  97.7 F (36.5 C) 98.2 F (36.8 C)   TempSrc:  Oral Oral   SpO2: 94% 97% 98% 96%  Weight:  115.4 kg    Height:  Physical Exam Constitutional:      General: He is not in acute distress.    Appearance: Normal appearance.  HENT:     Head: Normocephalic and atraumatic.     Mouth/Throat:     Mouth: Mucous membranes are moist.     Pharynx: Oropharynx is clear.  Eyes:     Extraocular Movements: Extraocular movements intact.     Pupils: Pupils are equal, round, and reactive to light.  Cardiovascular:     Rate and Rhythm: Normal rate and regular rhythm.     Pulses: Normal pulses.     Heart sounds:  Normal heart sounds.  Pulmonary:     Effort: Pulmonary effort is normal. No respiratory distress.     Breath sounds: Wheezing present.     Comments: Some transmitted upper airways sounds Abdominal:     General: Bowel sounds are normal. There is no distension.     Palpations: Abdomen is soft.     Tenderness: There is no abdominal tenderness.  Musculoskeletal:        General: No swelling or deformity.  Skin:    General: Skin is warm and dry.  Neurological:     General: No focal deficit present.     Mental Status: Mental status is at baseline.    Labs on Admission: I have personally reviewed following labs and imaging studies  CBC: Recent Labs  Lab 12/13/23 0802 12/17/23 0623  WBC 6.3 6.2  HGB 13.4 13.1  HCT 41.5 40.5  MCV 88.9 88.8  PLT 144* 142*    Basic Metabolic Panel: Recent Labs  Lab 12/11/23 0821 12/13/23 0802 12/17/23 0623  NA 139 136 137  K 3.8 3.8 4.1  CL 93* 94* 95*  CO2 37* 36* 32  GLUCOSE 120* 164* 122*  BUN 28* 26* 22  CREATININE 1.21 1.26* 1.13  CALCIUM  8.8* 8.8* 8.5*  MG  --  2.2  --     GFR: Estimated Creatinine Clearance: 74.1 mL/min (by C-G formula based on SCr of 1.13 mg/dL).  Liver Function Tests: No results for input(s): AST, ALT, ALKPHOS, BILITOT, PROT, ALBUMIN  in the last 168 hours.  Urine analysis:    Component Value Date/Time   COLORURINE YELLOW 12/17/2023 1102   APPEARANCEUR CLEAR 12/17/2023 1102   LABSPEC 1.017 12/17/2023 1102   PHURINE 6.0 12/17/2023 1102   GLUCOSEU NEGATIVE 12/17/2023 1102   GLUCOSEU NEGATIVE 09/20/2021 1431   HGBUR NEGATIVE 12/17/2023 1102   HGBUR negative 08/18/2008 1128   BILIRUBINUR NEGATIVE 12/17/2023 1102   BILIRUBINUR Negative 08/03/2023 1522   KETONESUR 5 (A) 12/17/2023 1102   PROTEINUR NEGATIVE 12/17/2023 1102   UROBILINOGEN negative (A) 08/03/2023 1522   UROBILINOGEN 0.2 09/20/2021 1431   NITRITE NEGATIVE 12/17/2023 1102   LEUKOCYTESUR NEGATIVE 12/17/2023 1102    Radiological  Exams on Admission: DG Chest Port 1 View Result Date: 12/16/2023 EXAM: 1 VIEW XRAY OF THE CHEST 12/16/2023 09:17:00 PM COMPARISON: 12/06/2023 CLINICAL HISTORY: Acute respiratory distress FINDINGS: LINES, TUBES AND DEVICES: Neural stimulator over right chest. Left chest pacemaker/ICD noted. LUNGS AND PLEURA: Low lung volumes. Mild pulmonary edema. Elevated right hemidiaphragm. Bibasilar atelectasis. No pleural effusion. No pneumothorax. HEART AND MEDIASTINUM: Cardiomegaly. Post median sternotomy and CABG. unchanged cardiomediastinal silhouette. BONES AND SOFT TISSUES: Post median sternotomy and CABG. Neural stimulator over right chest. Left chest pacemaker/ICD noted. No acute osseous abnormality. IMPRESSION: 1. Mild pulmonary edema. 2. Low lung volumes. Electronically signed by: Morgane Naveau MD 12/16/2023 09:22 PM EST RP Workstation: [id number]   EKG: Not recently  performed  Assessment/Plan Principal Problem:   Parkinson's disease (HCC) Active Problems:   Type 2 diabetes mellitus with chronic kidney disease, with long-term current use of insulin  (HCC)   Restless leg syndrome   Neuropathy   Thrombocytopenia   Asthma   Shortness of breath Wheezing > Patient with a couple days of wheezing shortness of breath, lethargy. > With persistent wheezing primary concern is for asthma exacerbation. > On exam, some residual wheezing without rales or significant edema. > Also noted to have some transmitted upper airway sounds.  High suspicion for asthma at this time however if symptoms persist could consider vocal cord dysfunction. - Patient may remain on rehab unit for now - Continue supplemental oxygen as needed - Pulse ox frequently - Schedule DuoNebs while awake overnight - Solumedrol now followed by daily prednisone  - Check BNP to rule out CHF - Continue formulary Breo in place of home Wixela - As needed albuterol  - Respiratory viral screen   Chronic systolic CHF > Last echo was last year  with EF 30 of 30%, G2 DD, mildly reduced RV function. Mild edema on chest x-ray.  Received extra dose of Lasix  yesterday. - Continue with Lasix  - Check BNP - Continue spironolactone , losartan , metoprolol   Parkinson disease > Status post DBS - Continue Sinemet   Hypertension - Has been continued on home Lasix , spironolactone , losartan , metoprolol   Hyperlipidemia - Remains on home Vascepa   Hypothyroidism - Continue with Synthroid   CAD > Status post CABG - Continue home losartan , metoprolol , spironolactone , ASA  CKD 3 > Creatinine stable - Continue to trend labs  Diabetes - Continue with insulin  per primary team  OSA > History of intolerance with CPAP due to panic attacks - Continue oxygen qHS  Obesity - Noted  TRH will continue to follow the patient.  Family Communication: None at time of evaluation  Thank you very much for involving us  in the care of your patient.  Author: Marsa KATHEE Scurry, MD 12/17/2023 5:57 PM  For on call review [email or web address].

## 2023-12-17 NOTE — Progress Notes (Signed)
 Speech Language Pathology Daily Session Note  Patient Details  Name: Henry [name] MRN: [id number] Date of Birth: September 25, 1950  Today's Date: 12/17/2023 SLP Individual Time: 1432-1530 SLP Individual Time Calculation (min): 58 min  Short Term Goals: Week 2: SLP Short Term Goal 1 (Week 2): Pt will utilize compensatory speech strategies to maintain 80% intelligibility or greater at phrase level given modA SLP Short Term Goal 2 (Week 2): Patient will complete SPEAK OUT! vocal intensity exercises with mod assist and use of visual aid. SLP Short Term Goal 3 (Week 2): Pt will solve simple problems w/ minA SLP Short Term Goal 4 (Week 2): Pt will sustain attention for ~15 mins w/ modA SLP Short Term Goal 5 (Week 2): Pt will tolerate regular textures w/ thin liquids w/ no overt s/s of airway invasion 95% of the time or greater SLP Short Term Goal 6 (Week 2): Pt will recall recent/relevant info w/ modA  Skilled Therapeutic Interventions:  Patient was seen in am to address cognitive training, and communication. Pt easily alerted upon SLP arrival. He denied pain and was agreeable for session. Pt inconsistently oriented to time, place and situation which appears to be a difference from previous sessions. He also did not recall participation in previous sessions. Pt often times reaching for items not there. Speech intelligibility was significantly impaired <25% intelligible in words and phrases. Pt also noted with fluctuating alertness often times demonstrating jerky movements upon reawakening. SLP engaging pt in a visual problem solving task as alertness improved. Pt identified problems and solutions given a visual stimulus with max A. He did identify call button indep however required max A to verbalize reasons to utilize. At conclusion of session, pt was left upright in [facility] with call button within reach and chair alarm active. SLP to continue POC.  Pain Pain Assessment Pain Scale: 0-10 Pain Score: 0-No  pain  Therapy/Group: Individual Therapy  Joane GORMAN Fuss 12/17/2023, 3:38 PM

## 2023-12-18 LAB — URINE CULTURE

## 2023-12-18 LAB — GLUCOSE, CAPILLARY
Glucose-Capillary: 132 mg/dL — ABNORMAL HIGH (ref 70–99)
Glucose-Capillary: 144 mg/dL — ABNORMAL HIGH (ref 70–99)
Glucose-Capillary: 154 mg/dL — ABNORMAL HIGH (ref 70–99)

## 2023-12-18 MED ORDER — AMANTADINE HCL 100 MG PO CAPS
100.0000 mg | ORAL_CAPSULE | Freq: Every day | ORAL | Status: DC
  Administered 2023-12-19: 100 mg via ORAL
  Filled 2023-12-18 (×2): qty 1

## 2023-12-18 MED ORDER — PREDNISONE 20 MG PO TABS
50.0000 mg | ORAL_TABLET | Freq: Every day | ORAL | Status: DC
  Administered 2023-12-19 – 2023-12-20 (×2): 50 mg via ORAL
  Filled 2023-12-18 (×2): qty 1

## 2023-12-18 NOTE — Consult Note (Signed)
 Palliative Medicine Inpatient Consult Note  Consulting Provider:  Lorilee Sven SQUIBB, MD   Reason for consult:   Palliative Care Consult Services Palliative Medicine Consult  Reason for Consult? goals of care discussion   12/18/2023  HPI:  Per intake H&P -->  Henry [name] is a 73 y.o. male with medical history significant of hypertension, hyperlipidemia, diabetes, hypothyroidism, CAD status post CABG, GERD, gout, CKD 3, chronic systolic CHF, status post AICD, RLS, Parkinson disease, status post deep brain stimulator, neuropathy, asthma, OSA, obesity who presented to rehab after inpatient stay. Palliative care has been asked to support additional goals of care conversations.  Clinical Assessment/Goals of Care:  *Please note that this is a verbal dictation therefore any spelling or grammatical errors are due to the Dragon Medical One system interpretation.  I have reviewed medical records including EPIC notes, labs and imaging, received report from bedside RN, assessed the patient who is lying in bed in no acute distress though minimally vocal.    I spoke with patient's wife, Henry [name] to further discuss diagnosis prognosis, GOC, EOL wishes, disposition and options.   I introduced Palliative Medicine as specialized medical care for people living with serious illness. It focuses on providing relief from the symptoms and stress of a serious illness. The goal is to improve quality of life for both the patient and the family.  Medical History Review and Understanding:  A review of Henry [name]'s past medical history significant for type 2 diabetes, coronary artery disease, stage III chronic kidney disease, systolic heart failure, hypothyroidism, Parkinson's disease, neuropathy, deep brain stimulator (for PD), asthma, and obstructive sleep apnea was completed.  Social History:  [name] lives in [place], [place] .  He and his wife have been married for the last 53 years.  They share 3  children, 7 grandchildren, 1 great grandchild, and 2 great-grandchildren are on the way in March and May of 2026.  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  He also enjoyed recreationally fishing, going out to eat, and seeing and spending time with his family.  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  Functional and Nutritional State:  Preceding hospitalization, Henry [name] had been able to walk around the home with a front wheel walker.  Patient's wife shares that his ability to perform B ADLs has declined he requires help with dressing and bathing.  He has been eating but not a great amount.  Advance Directives:  A detailed discussion was had today regarding advanced directives.  There are no advanced directives identified.  Patient's surrogate decision maker is his wife, Henry [name].  Code Status:  Concepts specific to code status, artifical feeding and hydration, continued IV antibiotics and rehospitalization was had.  The difference between a aggressive medical intervention path  and a palliative comfort care path for this patient at this time was had.   Henry [name] is an established DO NOT RESUSCITATE DO NOT INTUBATE CODE STATUS.  Discussion:  A review of the circumstances leading to Henry [name]'s hospitalization was completed.  We discussed that Henry [name] had been progressively weak which is what prompted family to bring in from home to the hospital.  This has been going on for roughly 3 weeks.  He has suffered with Parkinson's disease since 1996 and retired in 2008 as a result of his worsening symptom burden.  We reviewed that there has been a comprehensive workup completed since [name] has been hospitalized though there has been no significant reasons why his clinical condition continues  to worsen.  Patient's wife shares the decision to send him to acute rehabilitation was made to see if he could make improvement.  Patient's wife understands instead of him improving  he continues to have barriers leading to further functional and cognitive decline.  We discussed options moving forward if Henry [name] continues this unfortunate trajectory inclusive of hospice. I described hospice as a service for patients who have a life expectancy of 6 months or less.  The goal of hospice is the preservation of dignity and quality at the end phases of life. Under hospice care, the focus changes from curative to symptom relief. Henry [name] shares understand of what this is an realizes his clinical situation may not improve. We have agreed to continue these conversations in the oncoming day(s).   Discussed the importance of continued conversation with family and their  medical providers regarding overall plan of care and treatment options, ensuring decisions are within the context of the patients values and GOCs.  Decision Maker: Henry [name] (Spouse): [phone] (Mobile)   SUMMARY OF RECOMMENDATIONS   DNAR/DNI  Continue current clinical care allowing time for outcomes  Open and honest conversations held in the setting of patient's acute on chronic disease burden  Ongoing conversations pending patient's improvements and/or declines --> have gently introduced the topic of hospice if patient should continue to deteriorate  The palliative medicine team will continue to follow along with Henry [name] during hospitalization  Code Status/Advance Care Planning: DNAR/DNI   Symptom Management:  Implement strict delirium precautions  Palliative Prophylaxis:  Aspiration, Bowel Regimen, Delirium Protocol, Frequent Pain Assessment, Oral Care, Palliative Wound Care, and Turn Reposition  Additional Recommendations (Limitations, Scope, Preferences): Continue current care  Psycho-social/Spiritual:  Desire for further Chaplaincy support: Yes Additional Recommendations: Education on chronic disease trajectory   Prognosis: Limited overall given patient's recent clinical decline, high chronic  comorbidity disease burden, generalized frailty, decreased oral intake  Discharge Planning: Discharge plan to be determined  Vitals:   12/18/23 0236 12/18/23 0455  BP:  138/76  Pulse: 68 72  Resp: 20 19  Temp:  (!) 97.5 F (36.4 C)  SpO2: 97% 96%    Intake/Output Summary (Last 24 hours) at 12/18/2023 0709 Last data filed at 12/18/2023 0600 Gross per 24 hour  Intake 720 ml  Output 750 ml  Net -30 ml   Last Weight  Most recent update: 12/17/2023  4:00 AM    Weight  115.4 kg (254 lb 6.6 oz)            LABS: CBC:    Component Value Date/Time   WBC 6.2 12/17/2023 0623   HGB 13.1 12/17/2023 0623   HCT 40.5 12/17/2023 0623   PLT 142 (L) 12/17/2023 0623   PLT 138 06/25/2017 0000   MCV 88.8 12/17/2023 0623   NEUTROABS 3.3 12/06/2023 0433   LYMPHSABS 1.4 12/06/2023 0433   MONOABS 0.6 12/06/2023 0433   EOSABS 0.2 12/06/2023 0433   BASOSABS 0.0 12/06/2023 0433   Comprehensive Metabolic Panel:    Component Value Date/Time   NA 137 12/17/2023 0623   NA 141 08/16/2022 1115   K 4.1 12/17/2023 0623   CL 95 (L) 12/17/2023 0623   CO2 32 12/17/2023 0623   BUN 22 12/17/2023 0623   BUN 20 08/16/2022 1115   CREATININE 1.13 12/17/2023 0623   CREATININE 1.37 (H) 07/28/2022 0826   GLUCOSE 122 (H) 12/17/2023 0623   CALCIUM  8.5 (L) 12/17/2023 0623   AST <10 (L) 12/08/2023 0955   ALT <5 12/08/2023 0955  ALKPHOS 56 12/08/2023 0955   BILITOT 0.7 12/08/2023 0955   PROT 5.7 (L) 12/08/2023 0955   ALBUMIN  3.3 (L) 12/08/2023 0955   Gen: Elderly Caucasian male chronically ill in appearance HEENT: moist mucous membranes CV: Regular rate and rhythm  PULM: On 3 L nasal cannula breathing is even and unlabored ABD: soft/nontender  EXT: No edema  Neuro: Somnolent  PPS: 30%   This conversation/these recommendations were discussed with patient primary care team, Dr. Lorilee ______________________________________________________ Rosaline Becton [facility] Palliative Medicine  Team Team Cell Phone: [phone] Please utilize secure chat with additional questions, if there is no response within 30 minutes please call the above phone number  Total Time: 75 Billing based on MDM: High  Palliative Medicine Team providers are available by phone from 7am to 7pm daily and can be reached through the team cell phone.  Should this patient require assistance outside of these hours, please call the patient's attending physician.

## 2023-12-18 NOTE — Progress Notes (Incomplete)
 Occupational Therapy Discharge Summary  Patient Details  Name: Henry [name] MRN: [id number] Date of Birth: [birth year]  Date of Discharge from OT service:December 18, 2023   Patient has met {NUMBERS 0-12:18577} of {NUMBERS 0-12:18577} long term goals due to {due to:3041651}.  Patient to discharge at overall {LOA:3049010} level.  Patient's care partner {care partner:3041650} to provide the necessary {assistance:3041652} assistance at discharge.    Reasons goals not met: patient and family to D/C on hospice   Recommendation:  Patient will benefit from ongoing skilled OT services in {setting:3041680} to continue to advance functional skills in the area of {ADL/iADL:3041649}.  Equipment: Hospice to handle all equipment   Reasons for discharge: patient family requested to go home on hospice   Patient/family agrees with progress made and goals achieved: Yes  OT Discharge Precautions/Restrictions  Precautions Precautions: Fall Recall of Precautions/Restrictions: Impaired Precaution/Restrictions Comments: Parkinson's, deep brain stimulator, R ear deaf Restrictions Weight Bearing Restrictions Per Provider Order: No General Chart Reviewed: Yes Vital Signs   Pain Pain Assessment Pain Scale: 0-10 Pain Score: 0-No pain ADL ADL Eating: Moderate assistance Where Assessed-Eating: Wheelchair Grooming: Minimal assistance Where Assessed-Grooming: Sitting at sink Upper Body Bathing: Moderate assistance Where Assessed-Upper Body Bathing: Sitting at sink Lower Body Bathing: Dependent Where Assessed-Lower Body Bathing: Sitting at sink Upper Body Dressing: Maximal assistance Where Assessed-Upper Body Dressing: Wheelchair Lower Body Dressing: Dependent Where Assessed-Lower Body Dressing: Wheelchair Toileting: Dependent Where Assessed-Toileting: Bed level Toilet Transfer: Dependent Toilet Transfer Method: Stand pivot Tub/Shower Transfer: Not assessed Film/video Editor: Not  assessed Vision Baseline Vision/History: 1 Wears glasses Patient Visual Report: No change from baseline Vision Assessment?: No apparent visual deficits Perception  Perception: Not tested Praxis Praxis: Impaired Praxis Impairment Details: Motor planning;Organization Cognition Cognition Overall Cognitive Status: Impaired/Different from baseline Arousal/Alertness: Lethargic Orientation Level: Person Memory: Impaired Brief Interview for Mental Status (BIMS) Repetition of Three Words (First Attempt): 3 Temporal Orientation: Year: Correct Temporal Orientation: Month: Accurate within 5 days Temporal Orientation: Day: Correct Recall: Sock: No, could not recall Recall: Blue: Yes, no cue required Recall: Bed: Yes, no cue required BIMS Summary Score: 13 Sensation Sensation Light Touch: Appears Intact Hot/Cold: Not tested Proprioception: Appears Intact Coordination Gross Motor Movements are Fluid and Coordinated: No Fine Motor Movements are Fluid and Coordinated: No Coordination and Movement Description: impaired d/t parkinsons Finger Nose Finger Test: decreased coordination Motor  Motor Motor: Abnormal tone;Abnormal postural alignment and control Mobility  Bed Mobility Bed Mobility: (P) Rolling Right;Rolling Left;Supine to Sit Rolling Right: (P) Maximal Assistance - Patient 25-49% Rolling Left: (P) Maximal Assistance - Patient 25-49% Supine to Sit: (P) Moderate Assistance - Patient 50-74% Transfers Sit to Stand: Moderate Assistance - Patient 50-74% Stand to Sit: Moderate Assistance - Patient 50-74%  Trunk/Postural Assessment  Cervical Assessment Cervical Assessment: Exceptions to [facility] Thoracic Assessment Thoracic Assessment: Exceptions to [facility] Lumbar Assessment Lumbar Assessment: Exceptions to [facility] Postural Control Postural Control: Deficits on evaluation Trunk Control: slight posterior lean while sitting EOB Righting Reactions: delayed and inadequate Protective  Responses: delayed and inadequate  Balance Balance Balance Assessed: Yes Static Sitting Balance Static Sitting - Balance Support: No upper extremity supported;Feet supported Static Sitting - Level of Assistance: 5: Stand by assistance Dynamic Sitting Balance Dynamic Sitting - Balance Support: Feet supported;Left upper extremity supported Dynamic Sitting - Level of Assistance: 5: Stand by assistance;4: Min assist Static Standing Balance Static Standing - Balance Support: Bilateral upper extremity supported;During functional activity Static Standing - Level of Assistance: 4: Min assist Extremity/Trunk Assessment RUE Assessment  RUE Assessment: Exceptions to [facility] Active Range of Motion (AROM) Comments: slight increased tone and rigidity. ROM greatly effected by parkinsons General Strength Comments: 3/5 strength overall. LUE Assessment LUE Assessment: Exceptions to [facility] Active Range of Motion (AROM) Comments: slight increased tone and rigidity. ROM greatly effected by parkinsons General Strength Comments: 3/5 overall   Henry [name] 12/18/2023, 5:00 PM

## 2023-12-18 NOTE — Progress Notes (Signed)
 Physical Therapy Note  Patient Details  Name: Henry [name] MRN: [id number] Date of Birth: Feb 20, 1950 Today's Date: 12/18/2023    Pt missed 30 min of skilled PT due to refusal to participate due to not feeling well. Pt maintained eyes closed throughout attempts to engage him. Difficult to understand but pt stated he wouldn't be able to do any therapy and that this therapist was wasting my time if I came back later (told him I have him for another session this PM). Declined any bed level activities as well. Denies pain or SOB. Just states he cannot do anything today. All needs in reach.   Elnor Pizza Sherrell Pizza WENDI Elnor, PT, DPT, CBIS  12/18/2023, 11:01 AM

## 2023-12-18 NOTE — Progress Notes (Signed)
 Speech Language Pathology Daily Session Note  Patient Details  Name: Henry [name] MRN: [id number] Date of Birth: [birth year]  Today's Date: 12/18/2023 SLP Individual Time: 0920-1000 SLP Individual Time Calculation (min): 40 min  Short Term Goals: Week 2: SLP Short Term Goal 1 (Week 2): Pt will utilize compensatory speech strategies to maintain 80% intelligibility or greater at phrase level given modA SLP Short Term Goal 2 (Week 2): Patient will complete SPEAK OUT! vocal intensity exercises with mod assist and use of visual aid. SLP Short Term Goal 3 (Week 2): Pt will solve simple problems w/ minA SLP Short Term Goal 4 (Week 2): Pt will sustain attention for ~15 mins w/ modA SLP Short Term Goal 5 (Week 2): Pt will tolerate regular textures w/ thin liquids w/ no overt s/s of airway invasion 95% of the time or greater SLP Short Term Goal 6 (Week 2): Pt will recall recent/relevant info w/ modA  Skilled Therapeutic Interventions: SLP conducted skilled therapy session targeting communication goals. Patient endorses that since previous targeted session, patient has not completed any SPEAK OUT! Exercises introduced. SLP reeducated patient on SPEAK OUT! Warm up exercises to promote increased vocal intensity and articulatory precision. Patient completed variable vowel syllable repetition, loud Ahs, pitch glides, and effortful counting with overall mod to max assist. SLP provided new handout to promote carryover as prior handout was missing. Patient then used increased volume at the word level with mod -max cues for consistent carryover of strategies. Notably, patient is lethargic and requires increased cuing to achieve bolstered vocal intensity this date. Patient also presents with decreased mood towards end of session, making statements such as can we stop, no one can help me, etc. Patient was left in room with call bell in reach and alarm set. SLP will continue to target goals per plan of care.         Pain  None endorsed  Therapy/Group: Individual Therapy  Rosina Downy, M.A., CCC-SLP  Gillermo Poch A Carman Auxier 12/18/2023, 10:00 AM

## 2023-12-18 NOTE — Progress Notes (Signed)
 Progress Note   Patient: Henry [name] FMW:[id number] DOB: 1950-05-16 DOA: 12/05/2023     13 DOS: the patient was seen and examined on 12/18/2023   Brief hospital course:  Henry [name] is a 73 y.o. male with medical history significant of hypertension, hyperlipidemia, diabetes, hypothyroidism, CAD status post CABG, GERD, gout, CKD 3, chronic systolic CHF, status post AICD, RLS, Parkinson disease, status post deep brain stimulator, neuropathy, asthma, OSA, obesity who presented to rehab after inpatient stay. Patient was admitted 11/18-11/26.   Initially came in with weakness for 2 to 3 weeks with episodes of facial asymmetry.  Symptoms appear to be worse after changing settings of his deep brain stimulator and neurology office.   Patient was worked up and evaluated by neurology while admitted.  CT head remained stable while admitted and unable to obtain MRI brain due to deep brain stimulator.  Patient was worked up for seizure and EEG was negative for any evidence of seizure.  It is thought that his presenting symptoms are likely due to accommodation of exacerbation of his Parkinson's disease, change in the brain stimulator settings, deconditioning.  PT recommended CIR upon discharge.   Patient has been at [facility] since 11/26.  Recently has developed some issues with shortness of breath, wheezing.     Chest x-ray 12/7: Mild pulmonary edema.  Patient also received albuterol  nebulizer.     Assessment and Plan:  Asthma exacerbation Patient presented with shortness of breath and wheezing High suspicion for asthma at this time however if symptoms persist could consider vocal cord dysfunction. Continue as needed nebulization Currently requiring 3 L of oxygen - Schedule DuoNebs while awake overnight - Solumedrol now followed by daily prednisone  to complete 5 days course - Check BNP to rule out CHF - Continue formulary Breo in place of home Wixela - As needed albuterol  - Respiratory panel  negative   Chronic systolic CHF Last echo was last year with EF  30%, G2 DD, mildly reduced RV function. Mild edema on chest x-ray.  - Continue with p.o. Lasix  - Continue spironolactone , losartan , metoprolol    Parkinson disease Status post DBS - Continue Sinemet    Hypertension - Has been continued on home Lasix , spironolactone , losartan , metoprolol    Hyperlipidemia - Remains on home Vascepa    Hypothyroidism - Continue with Synthroid    CAD Status post CABG - Continue home losartan , metoprolol , spironolactone , ASA   CKD 3 Creatinine stable - Continue to trend labs   Diabetes - Continue with insulin  per primary team   OSA  History of intolerance with CPAP due to panic attacks - Continue oxygen qHS   Obesity - Noted   TRH will continue to follow the patient.   Family Communication: None at time of evaluation   Thank you very much for involving us  in the care of your patient.    Subjective:  Patient seen and examined at bedside this morning Requiring 3 L of intranasal oxygen Denies chest pain nausea or vomiting Still appears weak  Physical Exam Constitutional:      General: He is not in acute distress.    Appearance: Normal appearance.  HENT:     Head: Normocephalic and atraumatic.  Eyes:     Extraocular Movements: Extraocular movements intact.     Pupils: Pupils are equal, round, and reactive to light.  Cardiovascular:     Rate and Rhythm: Normal rate and regular rhythm.     Pulses: Normal pulses.     Heart sounds: Normal heart sounds.  Pulmonary:     Effort: Pulmonary effort is normal.     Comments: Some transmitted upper airways sounds Abdominal:     General: Bowel sounds are normal. There is no distension.     Palpations: Abdomen is soft.     Tenderness: There is no abdominal tenderness.  Musculoskeletal:        General: No swelling or deformity.  Skin:    General: Skin is warm and dry.  Neurological:     General: No focal deficit present.      Mental Status: Mental status is at baseline  Vitals:   12/18/23 0236 12/18/23 0455 12/18/23 0735 12/18/23 1243  BP:  138/76  117/84  Pulse: 68 72 76 62  Resp: 20 19 (!) 23 16  Temp:  (!) 97.5 F (36.4 C)  98.3 F (36.8 C)  TempSrc:  Oral  Oral  SpO2: 97% 96% 95% 100%  Weight:      Height:        Data Reviewed:    Latest Ref Rng & Units 12/17/2023    6:23 AM 12/13/2023    8:02 AM 12/10/2023    5:02 AM  CBC  WBC 4.0 - 10.5 K/uL 6.2  6.3  7.2   Hemoglobin 13.0 - 17.0 g/dL 86.8  86.5  86.6   Hematocrit 39.0 - 52.0 % 40.5  41.5  42.1   Platelets 150 - 400 K/uL 142  144  139        Latest Ref Rng & Units 12/17/2023    6:23 AM 12/13/2023    8:02 AM 12/11/2023    8:21 AM  BMP  Glucose 70 - 99 mg/dL 877  835  879   BUN 8 - 23 mg/dL 22  26  28    Creatinine 0.61 - 1.24 mg/dL 8.86  8.73  8.78   Sodium 135 - 145 mmol/L 137  136  139   Potassium 3.5 - 5.1 mmol/L 4.1  3.8  3.8   Chloride 98 - 111 mmol/L 95  94  93   CO2 22 - 32 mmol/L 32  36  37   Calcium  8.9 - 10.3 mg/dL 8.5  8.8  8.8      Family Communication: No family present at bedside  Time spent: 42 minutes  Author: Drue ONEIDA Potter, MD 12/18/2023 2:52 PM  For on call review [email or web address].

## 2023-12-18 NOTE — Progress Notes (Addendum)
   Palliative Medicine Inpatient Follow Up Note  Brief Palliative Medicine Progress Note:   Family meeting held this late afternoon with patients spouse, [name], daughters Gaetana Nest, and son Franky. Providers present were Dr. Lorilee via speaker-phone and myself.  Discussions were held regarding patients tenuous hospitalization. Dr. Lorilee provided a formal medical update inclusive of concerns patient is not turning around for the better despite aggressive treatment and therapy. The thought of continued care versus hospice was posed.  Patients family share understanding of Dorion's disease burden most notably in the setting of his PD. They share understanding of the disease and it's progression. They note his worsening delirium and hallucinations. Formal medical review was completed.   Patient youngest daughter has familiarity with hospice and as a family they feel this is most certainly the next best step. I described hospice as a service for patients who have a life expectancy of 6 months or less. The goal of hospice is the preservation of dignity and quality at the end phases of life.  Under hospice care, the focus changes from curative to symptom relief.   Patients family were offered choice and have selected [facility]. They are hoping to get [name] home sooner than later.   SUMMARY OF RECOMMENDATIONS   DNAR/DNI  Goal to get home with hospice  Appreciate TOC coordinating a meeting with [facility]  Ongoing PMT support ______________________________________________________________________________________ Rosaline Becton [facility] Palliative Medicine Team Team Cell Phone: [phone] Please utilize secure chat with additional questions, if there is no response within 30 minutes please call the above phone number  Time Spent: 24  Palliative Medicine Team providers are available by phone from 7am to 7pm daily and can be reached through the team cell phone.  Should this  patient require assistance outside of these hours, please call the patient's attending physician.

## 2023-12-18 NOTE — Progress Notes (Addendum)
 Physical Therapy Session Note  Patient Details  Name: Henry [name] MRN: [id number] Date of Birth: [birth year]  Today's Date: 12/18/2023 PT Individual Time: 1307-1405 PT Individual Time Calculation (min): 58 min   Short Term Goals: Week 1:  PT Short Term Goal 1 (Week 1): pt will transfer supine<>sitting EOB with mod A of 1 PT Short Term Goal 1 - Progress (Week 1): Progressing toward goal PT Short Term Goal 2 (Week 1): pt will transfer bed<>chair with LRAD and max A of 1 PT Short Term Goal 2 - Progress (Week 1): Met PT Short Term Goal 3 (Week 1): pt will transfer sit<>stand with max A of 1 PT Short Term Goal 3 - Progress (Week 1): Met Week 2:  PT Short Term Goal 1 (Week 2): pt will transfer supine<>sitting EOB with mod A of 1 PT Short Term Goal 2 (Week 2): pt will transfer sit<>stand with LRAD and min A consistantly PT Short Term Goal 3 (Week 2): pt will propel WC 8ft with CGA  Skilled Therapeutic Interventions/Progress Updates:    Pt presents in bed, alert this PM. Pt did not recall this therapist attempting to work with with him this morning. Pt on 1.5 L O2 via [place] with sats around 90-91% and HR in the 60s and increased work of breathing, mouth breathing noted. Pt reports urgency. Total A to position and set up urinal but pt unable to hold it completely so some spilled in brief before captured in urinal. Max A for rolling to R and L with cues for technique and hand placement on rail to assist for changing of brief and donning new brief as well as pants. Difficulty tolerating full flat position for easier rolling. Sats dropped to 88% but the rebounded with position change. Pt agreeable to attempt OOB this PM. Daughter arrived in room and eager to assist with donning of pants, stating they were hoping to take the pt home today and was planning to meet with palliative care team shortly. Mod A to come to EOB with daughter also providing assistance, cues for technique and to scoot hips forward.  Difficulty with reciprocal scooting requiring mod A. Cues to inhale through nose via [place]. Extra time but able to perform sit > stand with mod A with use of Stedy, adjusted brief and pants for improved alignment and fit in standing, cues for pt to maintain upright posture as tendency to sink down. Transferred to TIS via stedy and brought to therapy gym. Vitals remained > 92% throughout mobility and rest of session, HR in the 60s. Work on breathing decreased once pt in TIS w/c upright position and did not increase again until performing functional dynamic sitting balance and core activation tasks. Performed with 4# straight weight bicep curls and then hitting beach ball to various locations x 4 rounds of 25 reps each. Rest breaks between trials. Returned to room and set up with all needs in reach.     Returned later and issued handout for family for HEP to address LE and core strengthening exercises.   Access Code: [email or web address] URL: [email or web address] Date: 12/18/2023 Prepared by: Donald  Exercises - Supine Heel Slide  - 1 x daily - 7 x weekly - 3 sets - 10 reps - Gluteal Sets  - 1 x daily - 7 x weekly - 3 sets - 10 reps - Supine Hip Abduction  - 1 x daily - 7 x weekly - 3 sets - 10 reps - Seated Long Arc Quad  -  1 x daily - 7 x weekly - 3 sets - 10 reps - Seated March  - 1 x daily - 7 x weekly - 3 sets - 10 reps - Seated Isometric Hip Adduction with Ball  - 1 x daily - 7 x weekly - 3 sets - 10 reps - Seated Diagonal Chop with Medicine Ball  - 1 x daily - 7 x weekly - 3 sets - 10 reps  Therapy Documentation Precautions:  Precautions Precautions: Fall Recall of Precautions/Restrictions: Impaired Precaution/Restrictions Comments: Parkinson's, deep brain stimulator, R ear deaf Restrictions Weight Bearing Restrictions Per Provider Order: No  Pain:  Denies pain.    Therapy/Group: Individual Therapy  Elnor Pizza Sherrell Pizza WENDI Elnor, PT, DPT, CBIS  12/18/2023, 2:20 PM

## 2023-12-18 NOTE — Progress Notes (Addendum)
 Henry [name] [id number] [facility] Liaison Note  Received request from [name], Care Manager, for hospice services at home after discharge. Spoke with spouse [name] to initiate education related to hospice philosophy, services, and team approach to care. Family verbalized understanding of information given. Per discussion, the plan is for discharge home by PTAR/EMS tomorrow.  DME needs discussed. Patient has the following equipment in the home: rollator, wheelchair. Patient/family requests the following equipment for delivery: hospital bed, over bed table, oxygen. 3 in 1 commode  The address has been verified and is correct in the chart. [name] is the family contact to arrange time of equipment delivery.  Please send signed and completed DNR home with patient/family. Please provide prescriptions at discharge as needed to ensure ongoing symptom management.  [facility] information and contact numbers given to [name].  Please call with any questions or concerns. Thank you for the opportunity to participate in this patient's care.  Eleanor Nail, LPN [facility] Liaison [phone]

## 2023-12-18 NOTE — Progress Notes (Addendum)
 PROGRESS NOTE   Subjective/Complaints: Called wife to update on patient's status, stayed on speaker phone to hear her conversation with palliative care, short acting insulin  d/ced     12/18/2023   12:43 PM 12/18/2023    7:35 AM 12/18/2023    4:55 AM  Vitals with BMI  Systolic 117  138  Diastolic 84  76  Pulse 62 76 72    Recent Labs    12/17/23 2113 12/18/23 0559 12/18/23 1113  GLUCAP 84 132* 144*     ROS: +diffuse weakness, denies pain,  +urinary urgency, improving ambulation with therapy, +desaturation with therapy  Objective:   DG Chest 2 View Result Date: 12/17/2023 EXAM: 2 VIEW(S) XRAY OF THE CHEST 12/17/2023 05:43:00 PM COMPARISON: 12/16/2023 CLINICAL HISTORY: Hypoxia. FINDINGS: LINES, TUBES AND DEVICES: Left chest wall ICD and right chest battery packs for stimulator devices unchanged. LUNGS AND PLEURA: Low lung volumes. Elevated right hemidiaphragm. Bilateral interstitial opacities. Bilateral pleural effusions. No pneumothorax. HEART AND MEDIASTINUM: Similar cardiomegaly. BONES AND SOFT TISSUES: Prior median sternotomy. No acute osseous abnormality. IMPRESSION: 1. Stable. 2. Bilateral interstitial opacities and pleural effusions. 3. Cardiomegaly, similar to the prior study. 4. Low lung volumes and elevated right hemidiaphragm. Electronically signed by: Morgane Naveau MD 12/17/2023 08:37 PM EST RP Workstation: [id number]   DG Chest Port 1 View Result Date: 12/16/2023 EXAM: 1 VIEW XRAY OF THE CHEST 12/16/2023 09:17:00 PM COMPARISON: 12/06/2023 CLINICAL HISTORY: Acute respiratory distress FINDINGS: LINES, TUBES AND DEVICES: Neural stimulator over right chest. Left chest pacemaker/ICD noted. LUNGS AND PLEURA: Low lung volumes. Mild pulmonary edema. Elevated right hemidiaphragm. Bibasilar atelectasis. No pleural effusion. No pneumothorax. HEART AND MEDIASTINUM: Cardiomegaly. Post median sternotomy and CABG. unchanged  cardiomediastinal silhouette. BONES AND SOFT TISSUES: Post median sternotomy and CABG. Neural stimulator over right chest. Left chest pacemaker/ICD noted. No acute osseous abnormality. IMPRESSION: 1. Mild pulmonary edema. 2. Low lung volumes. Electronically signed by: Kate Plummer MD 12/16/2023 09:22 PM EST RP Workstation: [id number]    Recent Labs    12/17/23 0623  WBC 6.2  HGB 13.1  HCT 40.5  PLT 142*   Recent Labs    12/17/23 0623  NA 137  K 4.1  CL 95*  CO2 32  GLUCOSE 122*  BUN 22  CREATININE 1.13  CALCIUM  8.5*    Intake/Output Summary (Last 24 hours) at 12/18/2023 1345 Last data filed at 12/18/2023 0756 Gross per 24 hour  Intake 720 ml  Output 750 ml  Net -30 ml         Physical Exam: Vital Signs Blood pressure 117/84, pulse 62, temperature 98.3 F (36.8 C), temperature source Oral, resp. rate 16, height 5' 9 (1.753 m), weight 115.4 kg, SpO2 100%. Constitutional: No distress . Vital signs reviewed. obese HEENT: NCAT, EOMI, oral membranes moist Neck: supple Cardiovascular: Bradycardia Respiratory/Chest: wheezing more today GI/Abdomen: BS +, non-tender, non-distended Ext: no clubbing, cyanosis, or edema Psych: flat but cooperative  Skin: Bruising noted on bilateral upper extremities--stable Neuro:     Mental Status: AAOx to person and place; partially to time (month as November)  + memory deficits present, intermittant confusion, delayed responses,   Speech/Languate:  severe dysarthria  follows simple commands.  Pill rolling tremor bilaterally CRANIAL NERVES: II: PERRL. Visual fields full III, IV, VI: EOM intact, no gaze preference or deviation V: normal sensation bilaterally VII: no asymmetry VIII: normal hearing to speech IX, X: normal palatal elevation XI: head turn intact b/l XII: Tongue midline     MOTOR: Patient states he cannot participate in MMT today SENSORY: Normal to touch all 4 extremities   persistent rigidity in b/l legs Right  greater than left--ongoing     Assessment/Plan: 1. Functional deficits which require 3+ hours per day of interdisciplinary therapy in a comprehensive inpatient rehab setting. Physiatrist is providing close team supervision and 24 hour management of active medical problems listed below. Physiatrist and rehab team continue to assess barriers to discharge/monitor patient progress toward functional and medical goals  Care Tool:  Bathing    Body parts bathed by patient: Face, Right arm, Left arm, Chest   Body parts bathed by helper: Abdomen, Front perineal area, Buttocks, Right upper leg, Left upper leg, Right lower leg, Left lower leg     Bathing assist Assist Level: Maximal Assistance - Patient 24 - 49%     Upper Body Dressing/Undressing Upper body dressing   What is the patient wearing?: Pull over shirt    Upper body assist Assist Level: Maximal Assistance - Patient 25 - 49%    Lower Body Dressing/Undressing Lower body dressing      What is the patient wearing?: Pants, Incontinence brief     Lower body assist Assist for lower body dressing: Dependent - Patient 0%     Toileting Toileting    Toileting assist Assist for toileting: Total Assistance - Patient < 25%     Transfers Chair/bed transfer  Transfers assist     Chair/bed transfer assist level: Minimal Assistance - Patient > 75%     Locomotion Ambulation   Ambulation assist   Ambulation activity did not occur: Safety/medical concerns (decresed responsiveness, weakness, decreased balance, unable to respond to simple 1 step commands)  Assist level: Minimal Assistance - Patient > 75% Assistive device: Walker-rolling Max distance: 57ft   Walk 10 feet activity   Assist  Walk 10 feet activity did not occur: Safety/medical concerns (decresed responsiveness, weakness, decreased balance, unable to respond to simple 1 step commands)  Assist level: Minimal Assistance - Patient > 75% Assistive device:  Walker-rolling   Walk 50 feet activity   Assist Walk 50 feet with 2 turns activity did not occur: Safety/medical concerns (decresed responsiveness, weakness, decreased balance, unable to respond to simple 1 step commands)         Walk 150 feet activity   Assist Walk 150 feet activity did not occur: Safety/medical concerns (decresed responsiveness, weakness, decreased balance, unable to respond to simple 1 step commands)         Walk 10 feet on uneven surface  activity   Assist Walk 10 feet on uneven surfaces activity did not occur: Safety/medical concerns (decresed responsiveness, weakness, decreased balance, unable to respond to simple 1 step commands)         Wheelchair     Assist Is the patient using a wheelchair?: Yes Type of Wheelchair: Manual (TIS WC)    Wheelchair assist level: Dependent - Patient 0% Max wheelchair distance: >339ft    Wheelchair 50 feet with 2 turns activity    Assist        Assist Level: Dependent - Patient 0%   Wheelchair 150 feet activity     Assist  Assist Level: Dependent - Patient 0%   Blood pressure 117/84, pulse 62, temperature 98.3 F (36.8 C), temperature source Oral, resp. rate 16, height 5' 9 (1.753 m), weight 115.4 kg, SpO2 100%.  Medical Problem List and Plan: 1. Functional deficits secondary to generalized weakness due to parkinson disease. Pt has DBS.              -patient may shower             -ELOS/Goals: 14-16, PT sup to CGA, OT mon/mod A, SLP sup to min A             11/30 -pt with worsening dysarthria and weakness this past week. Both HCT and EEG reviewed. No acute changes on CT and EEG demonstrates diffuse slowing. Neurology did not recommend any changes yesterday.    - I don't see any infectious, pharmaceutical, or metabolic reasons for presentation. Labs were ok yesterday   -likely progression of PD unfortunately   -continue to monitor, encourage OOB activity   -perhaps follow up with  neurology this week Discussed with patient's family his progress, discussed with palliative care  2.  Antithrombotics: -DVT/anticoagulation:  Pharmaceutical: continue Lovenox              -antiplatelet therapy: ASA  3. Episodes of blank stares: EEG ordered and is consistent with encephalopathy- no seizures evident  4. Mood/Behavior/Sleep: LCSW to follow for evaluation and support.              -antipsychotic agents: N/A  5. Neuropsych/cognition: This patient may be intermittent  capable of making decisions on his own behalf.  6. Skin/Wound Care: Routine pressure relief measures  7. Class 2 obesity: provided wife with a list of foods for weight loss, continue magnesium  supplement  8. PD s/p DBS/Autonomic dysfunction: Monitor for orthostatic symptoms with increase in activity             -continue sinemet  ER five times a day.  --Pimavanserin  daily to  manage hallucinations  continue metanx Decrease amantadine  back to 100mg  daily   9. T2DM: Monitor BS ac/hs, d/c ISS to help get patient on home regimen, provided a list of foods that are good for diabetes             --continue Lantus  15 units Bid. Started metformin  500mg  daily with breakfast, d/c aspart  CBG (last 3)  Recent Labs    12/17/23 2113 12/18/23 0559 12/18/23 1113  GLUCAP 84 132* 144*    Decrease CBGs to AC 10. HFrEF: Strict I/O. Daily weights and monitor for signs of overload.              --Low salt diet. Continue ASA, Lasix , Cozaar , Toprol  XL and aldactone . Asked nursing to please weigh him today, lasix  10mg  additional ordered 11/28 for slight increase in weight   - asked nursing to weigh him Filed Weights   12/15/23 0500 12/16/23 0455 12/17/23 0400  Weight: 114.5 kg 115.4 kg 115.4 kg    11. Glaucoma: Managed with cosopt   12. Hyperactive airway disease: continue Breo. Duonebs started for asthma. Discussed with nursing goal sats 88-92%  13. Thrush: Continue nystatin  mouth wash  14. AKI: resolved, increase daily  lasix  back to 40mg  daily  15.  Thrombocytopenia: PC reviewed and is stable   16. Urinary retention: d/c mirabegron . Will monitor voiding with PVR checks. D/c flomax  given dizziness  17. Constipation: LBM 12/7. d/c miralax , asked nursing to please offer prune juice, continue magnesium  oxide 200mg  daily, magnesium   level reviewed and is 2.2 on 12/4.   - LBM 12/7  18. Concern for seizures: monitor for signs/symptoms, EEG ordered and shows no evidence of seizures  ` - see #3  19. Spasticity/dystonia: patient follows with Dr. Carilyn for botox  outpatient  20. Mild vascular congestion and pulmonary edema with mild atelectasis on CXR: encouraged use of incentive spirometer. Discussed with patient/wife/therapy weaning O2 with therapy, incentive spirometer ordered, weight reviewed and is stable, continue O2 prn, lasix  60mg  ordered 12/8, continue lasix  40mg  daily  21. Dizziness: flomax  d/ced, BP reviewed and is stable  22. Concern for UTI from family: UA reviewed and is negative  LOS: 13 days A FACE TO FACE EVALUATION WAS PERFORMED  Henry [name] P Derrico Zhong 12/18/2023, 1:45 PM

## 2023-12-18 NOTE — Progress Notes (Signed)
 Occupational Therapy Session Note  Patient Details  Name: Henry [name] MRN: [id number] Date of Birth: 06/07/1950  Today's Date: 12/18/2023 OT Individual Time: 8548-8395 OT Individual Time Calculation (min): 73 min    Short Term Goals: Week 2:  OT Short Term Goal 1 (Week 2): Pt will complete UB bathing with Mod A. OT Short Term Goal 2 (Week 2): Pt will complete UB dressing with Mod A. OT Short Term Goal 3 (Week 2): Patient will complete toilet transfer with min assist  Skilled Therapeutic Interventions/Progress Updates:  Patient agreeable to participate in OT session. Reports 0/10 pain level. Patient participated in skilled OT session focusing on functional exercise, and toileting. Patient received in room, family discussed patient discharging to hospice. Patient then transferred to gym to complete strengthening and HEP. Patient completed UE strength circuit, dowel rod 2# chest press, bicep curl 2x15. Patient then completed red theraband tricep extension, bicep curl, horizontal abduction 2x15. Patient vitals in normal range on 2L O2 via nasal cannula. Patient requested to toilet, had incontinent episode due to time. Patient then completed toilet transfer max to total A. Toileting 3/3 total A on toilet in room. Patient required increased time due to bowel urge with inability to use. . Patient then returned to sitting in wc, alarm placed on, all needs in reach family present. .    Therapy Documentation Precautions:  Precautions Precautions: Fall Recall of Precautions/Restrictions: Impaired Precaution/Restrictions Comments: Parkinson's, deep brain stimulator, R ear deaf Restrictions Weight Bearing Restrictions Per Provider Order: No  Therapy/Group: Individual Therapy  D'mariea L Tyrika Newman 12/18/2023, 5:03 PM

## 2023-12-19 ENCOUNTER — Telehealth: Payer: Self-pay

## 2023-12-19 DIAGNOSIS — J441 Chronic obstructive pulmonary disease with (acute) exacerbation: Secondary | ICD-10-CM

## 2023-12-19 LAB — GLUCOSE, CAPILLARY
Glucose-Capillary: 148 mg/dL — ABNORMAL HIGH (ref 70–99)
Glucose-Capillary: 172 mg/dL — ABNORMAL HIGH (ref 70–99)
Glucose-Capillary: 247 mg/dL — ABNORMAL HIGH (ref 70–99)

## 2023-12-19 LAB — CBC
HCT: 43.3 % (ref 39.0–52.0)
Hemoglobin: 14.2 g/dL (ref 13.0–17.0)
MCH: 29.2 pg (ref 26.0–34.0)
MCHC: 32.8 g/dL (ref 30.0–36.0)
MCV: 89.1 fL (ref 80.0–100.0)
Platelets: 162 K/uL (ref 150–400)
RBC: 4.86 MIL/uL (ref 4.22–5.81)
RDW: 13.9 % (ref 11.5–15.5)
WBC: 10.4 K/uL (ref 4.0–10.5)
nRBC: 0 % (ref 0.0–0.2)

## 2023-12-19 LAB — BASIC METABOLIC PANEL WITH GFR
Anion gap: 12 (ref 5–15)
BUN: 32 mg/dL — ABNORMAL HIGH (ref 8–23)
CO2: 34 mmol/L — ABNORMAL HIGH (ref 22–32)
Calcium: 9 mg/dL (ref 8.9–10.3)
Chloride: 93 mmol/L — ABNORMAL LOW (ref 98–111)
Creatinine, Ser: 1.31 mg/dL — ABNORMAL HIGH (ref 0.61–1.24)
GFR, Estimated: 58 mL/min — ABNORMAL LOW (ref >60)
Glucose, Bld: 127 mg/dL — ABNORMAL HIGH (ref 70–99)
Potassium: 3.8 mmol/L (ref 3.5–5.1)
Sodium: 139 mmol/L (ref 135–145)

## 2023-12-19 NOTE — Progress Notes (Signed)
 Physical Therapy Session Note  Patient Details  Name: Henry [name] MRN: [id number] Date of Birth: [birth year]  Today's Date: 12/19/2023 PT Individual Time: 8586-8562 PT Individual Time Calculation (min): 24 min  and Today's Date: 12/19/2023 PT Missed Time: 21 Minutes Missed Time Reason: Other (Comment) (Pt receiving breathing treatment)  Short Term Goals: Week 1:  PT Short Term Goal 1 (Week 1): pt will transfer supine<>sitting EOB with mod A of 1 PT Short Term Goal 1 - Progress (Week 1): Progressing toward goal PT Short Term Goal 2 (Week 1): pt will transfer bed<>chair with LRAD and max A of 1 PT Short Term Goal 2 - Progress (Week 1): Met PT Short Term Goal 3 (Week 1): pt will transfer sit<>stand with max A of 1 PT Short Term Goal 3 - Progress (Week 1): Met Week 2:  PT Short Term Goal 1 (Week 2): pt will transfer supine<>sitting EOB with mod A of 1 PT Short Term Goal 2 (Week 2): pt will transfer sit<>stand with LRAD and min A consistantly PT Short Term Goal 3 (Week 2): pt will propel WC 57ft with CGA  Skilled Therapeutic Interventions/Progress Updates:  Patient seated upright in TIS w/c on entrance to room. Patient alert and agreeable to PT session. Wife and dtr present.   Patient with no pain complaint at start of session.  Relates feeling better and discussed with pt and family re: pt's decision to complete therapy. Wife relates that hospital bed and other items have been delivered to home via hospice. Dtr relates that they have ordered STEDY for home use. Wife relates his use of U-walker at home and asks if he should use during therapy. Educated that if pt is used to using that and will be using again on d/c then it would be beneficial to use during the next week of therapy. Wife to bring in.   Pt demos ability to stand from TIS w/c from slight tilt to RW with CGA. Returns to sit with vc for reaching back to armrests. Pt then instructed to perform 5xSTS and is able to complete with  overall SBA to no AD in 19.8 sec. No LOB or bias fwd/ bkwd.   After discussion with family re: research on movement practices for those with Parkinson's Disease, pt guided in seated punching each UE to therapist's hand as target. Target moved to positions within to outside of BOS as well as from mid-shin height to overhead height. Pt demos good focus to targets with great trunk control and dynamic seated balance. Has great energy and enjoyment in task. Discussed existence of blaze pods and examples of use for seated and standing activities for UE and LE targets. Dtr looking into acquiring.  Patient seated upright in w/c at end of session with brakes locked and SLP arriving for next session. Pt left in care of SLP.    Therapy Documentation Precautions:  Precautions Precautions: Fall Recall of Precautions/Restrictions: Impaired Precaution/Restrictions Comments: Parkinson's, deep brain stimulator, R ear deaf Restrictions Weight Bearing Restrictions Per Provider Order: No General: PT Amount of Missed Time (min): 21 Minutes PT Missed Treatment Reason: Other (Comment) (Therapist late to appointment time)  Pain:  No pain related this session.   Therapy/Group: Individual Therapy  [name] [name] PT, DPT, CSRS 12/19/2023, 4:42 PM

## 2023-12-19 NOTE — Progress Notes (Signed)
 Occupational Therapy Note  Patient Details  Name: Henry [name] MRN: [id number] Date of Birth: 06-Jul-1950   Occupational Therapist participated in the interdisciplinary team conference, providing clinical information regarding the patient's current status, treatment goals, and weekly focus, including any barriers that need to be addressed. Please see the Inpatient Rehabilitation Team Conference and Plan of Care Update for further details.    Adra Shepler M 12/19/2023, 11:38 AM

## 2023-12-19 NOTE — Progress Notes (Signed)
 Physical Therapy Note  Patient Details  Name: NORVIN OHLIN MRN: [id number] Date of Birth: Jun 07, 1950 Today's Date: 12/19/2023    Physical Therapist participated in the interdisciplinary team conference, providing clinical information regarding the patients current status, treatment goals, and weekly focus, including any barriers that need to be addressed. Please see the Inpatient Rehabilitation Team Conference and Plan of Care Update for further details.   [name] [name] PT, DPT 12/19/2023, 11:33 AM

## 2023-12-19 NOTE — Patient Care Conference (Signed)
 Inpatient RehabilitationTeam Conference and Plan of Care Update Date: 12/19/2023   Time: 11:31 AM    Patient Name: Henry [name]      Medical Record Number: [id number]  Date of Birth: October 11, 1950 Sex: Male         Room/Bed: 4M03C/4M03C-01 Payor Info: Payor: MEDICARE / Plan: MEDICARE PART A AND B / Product Type: *No Product type* /    Admit Date/Time:  12/05/2023  6:03 PM  Primary Diagnosis:  Parkinson's disease [facility])  Hospital Problems: Principal Problem:   Parkinson's disease (HCC) Active Problems:   Type 2 diabetes mellitus with chronic kidney disease, with long-term current use of insulin  (HCC)   Hyperlipidemia   HTN (hypertension)   Hx of CABG x 3 in 2000   GERD   OSA on CPAP   Obesity, morbid (HCC)   PD (Parkinson's disease) (HCC)   Chronic systolic CHF (congestive heart failure) (HCC)   ICD- MDT, implanted 11/13/13   Restless leg syndrome   Neuropathy   CKD (chronic kidney disease) stage 3, GFR 30-59 ml/min (HCC)   Thrombocytopenia   Diabetes mellitus without complication (HCC)   CAD (coronary artery disease)   Asthma   Acute respiratory failure with hypoxia [facility])    Expected Discharge Date: Expected Discharge Date: 12/28/23  Team Members Present: Physician leading conference: Dr. Sven Elks Social Worker Present: Waverly Gentry, LCSW-A Nurse Present: Barnie Ronde, RN PT Present: [name], PT OT Present: Monica Peacock, OT SLP Present: Rosina Downy, SLP     Current Status/Progress Goal Weekly Team Focus  Bowel/Bladder      Mixed continence    Incontinence managed with min assist    Purewick at HS; toileting during the day  Swallow/Nutrition/ Hydration   regular/thin liquid diet   supervision  ongoing diet tolerance    ADL's   Mod/ Max A BADL, Toileting Max Assist   Min assist - may need to consider downgrading goals   fluctuating performance based on activity tolerance, cognition, balance    Mobility   bed mobility mod/max A, transfers  with RW min/mod A, gait up to 65ft with RW min A (fluctuates reently with mobility).   CGA overall, supervision WC mobility, and min A car transfer  barriers: fatigue, hx of Parkinson's w/ fluctuating symptoms, weakness/deconditioning, decreased standing balance/coordination    Communication   mod for use of speech intelligibility strategies   minA   use of speech intelligibility strategies at the phrase level    Safety/Cognition/ Behavioral Observations  min to mod deficits in areas of orientation, short term memory, problem solving, sustained attention   minA   memory strategies, basic problem solving, recall of speech intelligibility strategies, orientation    Pain      Oxy prn pain   Pain < 4 with prns    Assess pain q shift and need for with effectiveness of prn meds   Skin      Rash on body; MASD   Rash healing    Apply creams and ointment as ordered    Discharge Planning:  Home with [facility]. Awaiting delivery of DME to the home before discharging   Team Discussion: Patient admitted with debility; history of Asthma, Heart Failure, Parkinson's and multiple medical conditions, cognitive deficits, balance issues, fatigue and dysarthria.   Patient on target to meet rehab goals: Patient's functional level fluctuates depending on the day; yesterday patient declined continued therapy and wanted to go home as is. Today; doing better overall, but still needs mod -  max assist for ADLs.  Needs mod - max assist for bed mobility and min assist on a good day for transfers and sit - stand pivots with min assist for stand pivots.  Able to ambulate up to 28' using a RW with min assist.  Continue to work on cognitive deficits with min - mod assist and needs mod assist for speech strategies.   *See Care Plan and progress notes for long and short-term goals.   Revisions to Treatment Plan:  Palliative Care consult Hospice referral Nebulizer treatments Stroke work  up [place] [employer] for ADLs Speak Out program MBS; safe with regular texture/ thin liquid diet  Teaching Needs: Safety, medications,   Current Barriers to Discharge: Decreased caregiver support and Home enviroment access/layout  Possible Resolutions to Barriers: Family education Hospice referral Ramp for entry to home IFZ:mzrnffzwizi a stedy     Medical Summary Current Status: Parkinson's Disease, class 2 obesity, HTN, tachypnea, CHF, type 2 diabetes with hyperglycemia    Barriers to Discharge Comments: Parkinson's Disease, class 2 obesity, HTN, tachypnea, CHF, type 2 diabetes with hyperglycemia Possible Resolutions to [employer] Focus: continue Sinemet  2 tabs 5 times daily as needed. provded dietary education, continue amantadine , continue to monitor BP TID, continue to monitor respiratory rate, d/c losartan , mointor daily weights, continue to monitor CBGS   Continued Need for Acute Rehabilitation Level of Care: The patient requires daily medical management by a physician with specialized training in physical medicine and rehabilitation for the following reasons: Direction of a multidisciplinary physical rehabilitation program to maximize functional independence : Yes Medical management of patient stability for increased activity during participation in an intensive rehabilitation regime.: Yes Analysis of laboratory values and/or radiology reports with any subsequent need for medication adjustment and/or medical intervention. : Yes   I attest that I was present, lead the team conference, and concur with the assessment and plan of the team.   Fredericka Sober B 12/19/2023, 3:49 PM

## 2023-12-19 NOTE — Telephone Encounter (Signed)
 Called patients wife to check in on patient. Patient has refused to leave rehab and wants to finish his rehab and stay until the 19th. They have started him on Amantadine  and patients wife and daughter say he is acting strange since being put on this med. He is hallucinating , drowsy , refusing to do anything. Sunday and Monday they were unable to understand him at all. Patients daughter and wife are taking DBS up to rehab to day and going to try to turn it back on and see if this makes a difference

## 2023-12-19 NOTE — Progress Notes (Signed)
 Occupational Therapy Session Note  Patient Details  Name: Henry [name] MRN: [id number] Date of Birth: 1950-08-14  Today's Date: 12/19/2023 OT Individual Time: 1305-1350 OT Individual Time Calculation (min): 45 min    Short Term Goals: Week 2:  OT Short Term Goal 1 (Week 2): Pt will complete UB bathing with Mod A. OT Short Term Goal 2 (Week 2): Pt will complete UB dressing with Mod A. OT Short Term Goal 3 (Week 2): Patient will complete toilet transfer with min assist  Skilled Therapeutic Interventions/Progress Updates:  Pt greeted seated in w/c, pt agreeable to OT intervention.    Pt on 2L [place] with SpO2 > 94%   Transfers/bed mobility/functional mobility:  Pt completed 2 bouts of ~ 30 ft of functional ambulation with weighted Rw with MIN A +2 for chair follow. Max cues needed to facilitate upright posture and appropriate proximity to Rw.   Pt completed sit>stands with Rw and MINA, max cues for hand placement especially when reaching back to sit.   Therapeutic activity:  Pt instructed to stand to RW to reach [facility] to place horseshoes over rim of basketball goal to facilitate improved trunk extension as pt prefers to stand with a flexed trunk. Pt completed task with no UE support with CGA.   ADLs:   Transfers: pt reported need to void bladder, d/t urgency utilized stedy. Pt able to stand to stedy with MINA.  Toileting: pt with continent urine void, pt needed total A for 3/3 toileting tasks.   Ended session with pt seated in w/c with all needs within reach.   Therapy Documentation Precautions:  Precautions Precautions: Fall Recall of Precautions/Restrictions: Impaired Precaution/Restrictions Comments: Parkinson's, deep brain stimulator, R ear deaf Restrictions Weight Bearing Restrictions Per Provider Order: No  Pain: no pain     Therapy/Group: Individual Therapy  Ronal Mallie Needy 12/19/2023, 1:54 PM

## 2023-12-19 NOTE — Progress Notes (Signed)
 Patient ID: Henry [name], male   DOB: April 02, 1950, 73 y.o.   MRN: [id number]  Have reviewed team conference with pt and family. Both aware and agreeable with targeted d/c date of 12/19 and goals of Contact Guard/Touching assist.  Patient plans to resume hospice services with [facility] upon discharge. They have delivered the DME needed in the home but may have to take back until hospice services resume.

## 2023-12-19 NOTE — Progress Notes (Signed)
 Progress Note   Patient: Henry [name] FMW:[id number] DOB: October 02, 1950 DOA: 12/05/2023     14 DOS: the patient was seen and examined on 12/19/2023    Brief hospital course:  Henry [name] is a 73 y.o. male with medical history significant of hypertension, hyperlipidemia, diabetes, hypothyroidism, CAD status post CABG, GERD, gout, CKD 3, chronic systolic CHF, status post AICD, RLS, Parkinson disease, status post deep brain stimulator, neuropathy, asthma, OSA, obesity who presented to rehab after inpatient stay. Patient was admitted 11/18-11/26.   Initially came in with weakness for 2 to 3 weeks with episodes of facial asymmetry.  Symptoms appear to be worse after changing settings of his deep brain stimulator and neurology office.   Patient was worked up and evaluated by neurology while admitted.  CT head remained stable while admitted and unable to obtain MRI brain due to deep brain stimulator.  Patient was worked up for seizure and EEG was negative for any evidence of seizure.  It is thought that his presenting symptoms are likely due to accommodation of exacerbation of his Parkinson's disease, change in the brain stimulator settings, deconditioning.  PT recommended CIR upon discharge.   Patient has been at [facility] since 11/26.  Recently has developed some issues with shortness of breath, wheezing.     Chest x-ray 12/7: Mild pulmonary edema.  Patient also received albuterol  nebulizer.       Assessment and Plan:   Asthma exacerbation Patient presented with shortness of breath and wheezing High suspicion for asthma at this time however if symptoms persist could consider vocal cord dysfunction. Continue as needed nebulization Currently requiring 2 L of oxygen - Schedule DuoNebs while awake overnight - Solumedrol now followed by daily prednisone  to complete 5 days course - Check BNP to rule out CHF - Continue formulary Breo in place of home Wixela - As needed albuterol  - Respiratory panel  negative   Chronic systolic CHF Last echo was last year with EF  30%, G2 DD, mildly reduced RV function. Mild edema on chest x-ray.  - Continue with p.o. Lasix  - Continue spironolactone , losartan , metoprolol    Parkinson disease Status post DBS - Continue Sinemet    Hypertension - Has been continued on home Lasix , spironolactone , losartan , metoprolol    Hyperlipidemia - Remains on home Vascepa    Hypothyroidism - Continue with Synthroid    CAD Status post CABG - Continue home losartan , metoprolol , spironolactone , ASA   CKD 3 Creatinine stable - Continue to trend labs   Diabetes - Continue with insulin  per primary team   OSA  History of intolerance with CPAP due to panic attacks - Continue oxygen qHS   Obesity - Noted   TRH will continue to follow the patient.   Family Communication: None at time of evaluation   Thank you very much for involving us  in the care of your patient.     Subjective:  Patient seen and examined at bedside this morning Oxygen requirement has improved to 2 L intranasal oxygen today He admits to improvement in respiratory function Denies chest pain nausea vomiting and abdominal pain   Physical Exam Constitutional:      General: He is not in acute distress.    Appearance: Normal appearance.  HENT:     Head: Normocephalic and atraumatic.  Eyes:     Extraocular Movements: Extraocular movements intact.     Pupils: Pupils are equal, round, and reactive to light.  Cardiovascular:     Rate and Rhythm: Normal rate and regular rhythm.  Pulses: Normal pulses.     Heart sounds: Normal heart sounds.  Pulmonary: Air entry decreased bilaterally especially at the bases, no wheezing Abdominal:     General: Bowel sounds are normal. There is no distension.     Palpations: Abdomen is soft.     Tenderness: There is no abdominal tenderness.  Musculoskeletal:        General: No swelling or deformity.  Skin:    General: Skin is warm and dry.   Neurological:     General: No focal deficit present.     Mental Status: Mental status is at baseline    Data Reviewed:      Latest Ref Rng & Units 12/19/2023    6:49 AM 12/17/2023    6:23 AM 12/13/2023    8:02 AM  CBC  WBC 4.0 - 10.5 K/uL 10.4  6.2  6.3   Hemoglobin 13.0 - 17.0 g/dL 85.7  86.8  86.5   Hematocrit 39.0 - 52.0 % 43.3  40.5  41.5   Platelets 150 - 400 K/uL 162  142  144        Latest Ref Rng & Units 12/19/2023    6:49 AM 12/17/2023    6:23 AM 12/13/2023    8:02 AM  BMP  Glucose 70 - 99 mg/dL 872  877  835   BUN 8 - 23 mg/dL 32  22  26   Creatinine 0.61 - 1.24 mg/dL 8.68  8.86  8.73   Sodium 135 - 145 mmol/L 139  137  136   Potassium 3.5 - 5.1 mmol/L 3.8  4.1  3.8   Chloride 98 - 111 mmol/L 93  95  94   CO2 22 - 32 mmol/L 34  32  36   Calcium  8.9 - 10.3 mg/dL 9.0  8.5  8.8     Vitals:   12/18/23 1243 12/18/23 2106 12/19/23 0500 12/19/23 0737  BP: 117/84 106/68 120/88   Pulse: 62 72 69 65  Resp: 16   16  Temp: 98.3 F (36.8 C) (!) 97 F (36.1 C) 97.8 F (36.6 C)   TempSrc: Oral Oral Oral   SpO2: 100% 97% 98% 100%  Weight:      Height:        Author: Drue ONEIDA Potter, MD 12/19/2023 11:52 AM  For on call review [email or web address].

## 2023-12-19 NOTE — Progress Notes (Signed)
 Speech Language Pathology Daily Session Note  Patient Details  Name: Henry [name] MRN: [id number] Date of Birth: 11-12-1950  Today's Date: 12/19/2023 SLP Individual Time: [id number] and [id number] SLP Individual Time Calculation (min): 29 min and 55 min  Short Term Goals: Week 2: SLP Short Term Goal 1 (Week 2): Pt will utilize compensatory speech strategies to maintain 80% intelligibility or greater at phrase level given modA SLP Short Term Goal 2 (Week 2): Patient will complete SPEAK OUT! vocal intensity exercises with mod assist and use of visual aid. SLP Short Term Goal 3 (Week 2): Pt will solve simple problems w/ minA SLP Short Term Goal 4 (Week 2): Pt will sustain attention for ~15 mins w/ modA SLP Short Term Goal 5 (Week 2): Pt will tolerate regular textures w/ thin liquids w/ no overt s/s of airway invasion 95% of the time or greater SLP Short Term Goal 6 (Week 2): Pt will recall recent/relevant info w/ modA  Skilled Therapeutic Interventions:  Session 1: SLP conducted skilled therapy session targeting communication and cognitive goals. Upon SLP entry, patient immediately stating that he is feeling much better today, has had a change of heart, and remarks that he told them yesterday he wanted to give up but has changed his mind. Notably during discussion, patient's spontaneous speech is increased in volume with only min-mod cues. SLP facilitated basic finance management task where patient was asked to add pictured bills and coins. Patient completed task with 100% acc. Given supervision assist. In final minutes of session, facilitated time-based verbal problem solving task where patient mentally completed basic time calculations with mod assist overall. Patient was left in room with call bell in reach and alarm set. SLP will continue to target goals per plan of care.     Session 2: SLP conducted skilled therapy session targeting communication and cognitive goals. Patient continues to  be in good spirits, recalling all activities completed in recent OT and PT sessions with supervision assist. SLP facilitated utilization of speech intelligibility strategies at the word and short sentence levels. Patient benefited from min assist for consistent use to achieve 80% intelligibility. SLP then facilitated remaining questions from verbal time-based problem solving task initiated in first of today's sessions. Patient benefited from decreased cuing compared to the AM, only needing supervision-minA to accurately answer each. Patient then sorted cards swiftly and accurately based on varying conditions with supervision, then participated in novel basic card game with supervision. Patient was left in room with call bell in reach and alarm set. SLP will continue to target goals per plan of care.        Pain Pain Assessment Pain Scale: 0-10 Pain Score: 0-No pain Patients Stated Pain Goal: 0  Therapy/Group: Individual Therapy  Rosina Downy, M.A., CCC-SLP  Saamir Armstrong A Erique Kaser 12/19/2023, 9:46 AM

## 2023-12-19 NOTE — Progress Notes (Signed)
 PROGRESS NOTE   Subjective/Complaints: No new complaints this morning Patient states he feels better today and would like to continue with therapy rather than going home a this time     12/19/2023    7:37 AM 12/19/2023    5:00 AM 12/18/2023    9:06 PM  Vitals with BMI  Systolic  120 106  Diastolic  88 68  Pulse 65 69 72    Recent Labs    12/18/23 1113 12/18/23 1620 12/19/23 1132  GLUCAP 144* 154* 148*     ROS: +diffuse weakness, denies pain,  +urinary urgency, improving ambulation with therapy, +desaturation with therapy, improved cognition today  Objective:   DG Chest 2 View Result Date: 12/17/2023 EXAM: 2 VIEW(S) XRAY OF THE CHEST 12/17/2023 05:43:00 PM COMPARISON: 12/16/2023 CLINICAL HISTORY: Hypoxia. FINDINGS: LINES, TUBES AND DEVICES: Left chest wall ICD and right chest battery packs for stimulator devices unchanged. LUNGS AND PLEURA: Low lung volumes. Elevated right hemidiaphragm. Bilateral interstitial opacities. Bilateral pleural effusions. No pneumothorax. HEART AND MEDIASTINUM: Similar cardiomegaly. BONES AND SOFT TISSUES: Prior median sternotomy. No acute osseous abnormality. IMPRESSION: 1. Stable. 2. Bilateral interstitial opacities and pleural effusions. 3. Cardiomegaly, similar to the prior study. 4. Low lung volumes and elevated right hemidiaphragm. Electronically signed by: Kate Plummer MD 12/17/2023 08:37 PM EST RP Workstation: [id number]    Recent Labs    12/17/23 0623 12/19/23 0649  WBC 6.2 10.4  HGB 13.1 14.2  HCT 40.5 43.3  PLT 142* 162   Recent Labs    12/17/23 0623 12/19/23 0649  NA 137 139  K 4.1 3.8  CL 95* 93*  CO2 32 34*  GLUCOSE 122* 127*  BUN 22 32*  CREATININE 1.13 1.31*  CALCIUM  8.5* 9.0    Intake/Output Summary (Last 24 hours) at 12/19/2023 1323 Last data filed at 12/19/2023 1200 Gross per 24 hour  Intake 952 ml  Output --  Net 952 ml         Physical  Exam: Vital Signs Blood pressure 120/88, pulse 65, temperature 97.8 F (36.6 C), temperature source Oral, resp. rate 16, height 5' 9 (1.753 m), weight 115.4 kg, SpO2 100%. Constitutional: No distress . Vital signs reviewed. obese HEENT: NCAT, EOMI, oral membranes moist Neck: supple Cardiovascular: Bradycardia Respiratory/Chest: wheezing more today GI/Abdomen: BS +, non-tender, non-distended Ext: no clubbing, cyanosis, or edema Psych: flat but cooperative  Skin: Bruising noted on bilateral upper extremities--stable Neuro:     Mental Status: AAOx to person and place; partially to time (month as November)  + memory deficits present, intermittant confusion, delayed responses,   Speech/Languate:  severe dysarthria  follows simple commands. Pill rolling tremor bilaterally CRANIAL NERVES: II: PERRL. Visual fields full III, IV, VI: EOM intact, no gaze preference or deviation V: normal sensation bilaterally VII: no asymmetry VIII: normal hearing to speech IX, X: normal palatal elevation XI: head turn intact b/l XII: Tongue midline     MOTOR: 3/5 strength throughout SENSORY: Normal to touch all 4 extremities   persistent rigidity in b/l legs Right greater than left--ongoing     Assessment/Plan: 1. Functional deficits which require 3+ hours per day of interdisciplinary therapy in a comprehensive inpatient rehab  setting. Physiatrist is providing close team supervision and 24 hour management of active medical problems listed below. Physiatrist and rehab team continue to assess barriers to discharge/monitor patient progress toward functional and medical goals  Care Tool:  Bathing    Body parts bathed by patient: Face, Right arm, Left arm, Chest   Body parts bathed by helper: Abdomen, Front perineal area, Buttocks, Right upper leg, Left upper leg, Right lower leg, Left lower leg     Bathing assist Assist Level: Maximal Assistance - Patient 24 - 49%     Upper Body  Dressing/Undressing Upper body dressing   What is the patient wearing?: Pull over shirt    Upper body assist Assist Level: Moderate Assistance - Patient 50 - 74%    Lower Body Dressing/Undressing Lower body dressing      What is the patient wearing?: Pants, Incontinence brief     Lower body assist Assist for lower body dressing: Total Assistance - Patient < 25%     Toileting Toileting    Toileting assist Assist for toileting: Total Assistance - Patient < 25%     Transfers Chair/bed transfer  Transfers assist     Chair/bed transfer assist level: Total Assistance - Patient < 25%     Locomotion Ambulation   Ambulation assist   Ambulation activity did not occur: Safety/medical concerns  Assist level: Minimal Assistance - Patient > 75% Assistive device: Walker-rolling Max distance: 57ft   Walk 10 feet activity   Assist  Walk 10 feet activity did not occur: Safety/medical concerns  Assist level: Minimal Assistance - Patient > 75% Assistive device: Walker-rolling   Walk 50 feet activity   Assist Walk 50 feet with 2 turns activity did not occur: Safety/medical concerns         Walk 150 feet activity   Assist Walk 150 feet activity did not occur: Safety/medical concerns         Walk 10 feet on uneven surface  activity   Assist Walk 10 feet on uneven surfaces activity did not occur: Safety/medical concerns         Wheelchair     Assist Is the patient using a wheelchair?: Yes Type of Wheelchair: Manual    Wheelchair assist level: Dependent - Patient 0% Max wheelchair distance: >386ft    Wheelchair 50 feet with 2 turns activity    Assist        Assist Level: Dependent - Patient 0%   Wheelchair 150 feet activity     Assist      Assist Level: Dependent - Patient 0%   Blood pressure 120/88, pulse 65, temperature 97.8 F (36.6 C), temperature source Oral, resp. rate 16, height 5' 9 (1.753 m), weight 115.4 kg, SpO2  100%.  Medical Problem List and Plan: 1. Functional deficits secondary to generalized weakness due to parkinson disease. Pt has DBS.              -patient may shower             -ELOS/Goals: 14-16, PT sup to CGA, OT mon/mod A, SLP sup to min A             11/30 -pt with worsening dysarthria and weakness this past week. Both HCT and EEG reviewed. No acute changes on CT and EEG demonstrates diffuse slowing. Neurology did not recommend any changes yesterday.    - I don't see any infectious, pharmaceutical, or metabolic reasons for presentation. Labs were ok yesterday   -likely progression of  PD unfortunately   -continue to monitor, encourage OOB activity   -perhaps follow up with neurology this week Discussed with patient's family his progress, discussed with palliative care  2.  Antithrombotics: -DVT/anticoagulation:  Pharmaceutical: continue Lovenox              -antiplatelet therapy: ASA  3. Episodes of blank stares: EEG ordered and is consistent with encephalopathy- no seizures evident  4. Mood/Behavior/Sleep: LCSW to follow for evaluation and support.              -antipsychotic agents: N/A  5. Neuropsych/cognition: This patient may be intermittent  capable of making decisions on his own behalf.  6. Skin/Wound Care: Routine pressure relief measures  7. Class 2 obesity: provided wife with a list of foods for weight loss, continue magnesium  supplement, d/c feeding supplement  8. PD s/p DBS/Autonomic dysfunction: Monitor for orthostatic symptoms with increase in activity             -continue sinemet  ER five times a day.  --continue Pimavanserin  daily to  manage hallucinations  continue metanx Decrease amantadine  back to 100mg  daily   9. T2DM: Monitor BS ac/hs, d/c ISS to help get patient on home regimen, provided a list of foods that are good for diabetes             --continue Lantus  15 units Bid. Started metformin  500mg  daily with breakfast, d/c aspart  CBG (last 3)  Recent Labs     12/18/23 1113 12/18/23 1620 12/19/23 1132  GLUCAP 144* 154* 148*    Decrease CBGs to AC 10. HFrEF: Strict I/O. Daily weights and monitor for signs of overload.              --Low salt diet. Continue ASA, Lasix , Toprol  XL and aldactone .    - asked nursing to weigh him Filed Weights   12/15/23 0500 12/16/23 0455 12/17/23 0400  Weight: 114.5 kg 115.4 kg 115.4 kg    11. Glaucoma: Managed with cosopt   12. Hyperactive airway disease: continue Breo. Duonebs started for asthma. Discussed with nursing goal sats 88-92%, continue scheduled prednisone  50mg  daily  13. Thrush: d/c nystatin  since has completed 14 days  14. AKI: resolved, increase daily lasix  back to 40mg  daily  15.  Thrombocytopenia: PC reviewed and is stable   16. Urinary retention: d/c mirabegron . Will monitor voiding with PVR checks. D/c flomax  given dizziness  17. Constipation: d/c miralax , asked nursing to please offer prune juice, increase magnesium  oxide to 400mg  daily   - LBM 12/7, messaged nursing to confirm accuracy, magnesium  level reviewed and is 2.2  18. Concern for seizures: monitor for signs/symptoms, EEG ordered and shows no evidence of seizures  ` - see #3  19. Spasticity/dystonia: patient follows with Dr. Carilyn for botox  outpatient  20. Mild vascular congestion and pulmonary edema with mild atelectasis on CXR: encouraged use of incentive spirometer. Discussed with patient/wife/therapy weaning O2 with therapy, incentive spirometer ordered, weight reviewed and is stable, continue O2 prn, lasix  60mg  ordered 12/8, continue lasix  40mg  daily, asked nursing to please weigh patient today  21. Dizziness: flomax  d/ced, BP reviewed and is stable, losartan  d/ced  22. Concern for UTI from family: UA reviewed and is negative  LOS: 14 days A FACE TO FACE EVALUATION WAS PERFORMED  Kadeja Granada P Rhiannon Sassaman 12/19/2023, 1:23 PM

## 2023-12-20 LAB — BASIC METABOLIC PANEL WITH GFR
Anion gap: 6 (ref 5–15)
BUN: 36 mg/dL — ABNORMAL HIGH (ref 8–23)
CO2: 36 mmol/L — ABNORMAL HIGH (ref 22–32)
Calcium: 9 mg/dL (ref 8.9–10.3)
Chloride: 95 mmol/L — ABNORMAL LOW (ref 98–111)
Creatinine, Ser: 1.34 mg/dL — ABNORMAL HIGH (ref 0.61–1.24)
GFR, Estimated: 56 mL/min — ABNORMAL LOW (ref >60)
Glucose, Bld: 165 mg/dL — ABNORMAL HIGH (ref 70–99)
Potassium: 3.9 mmol/L (ref 3.5–5.1)
Sodium: 137 mmol/L (ref 135–145)

## 2023-12-20 LAB — GLUCOSE, CAPILLARY
Glucose-Capillary: 148 mg/dL — ABNORMAL HIGH (ref 70–99)
Glucose-Capillary: 154 mg/dL — ABNORMAL HIGH (ref 70–99)
Glucose-Capillary: 228 mg/dL — ABNORMAL HIGH (ref 70–99)
Glucose-Capillary: 203 mg/dL — ABNORMAL HIGH (ref 70–99)

## 2023-12-20 LAB — MAGNESIUM: Magnesium: 2.1 mg/dL (ref 1.7–2.4)

## 2023-12-20 MED ORDER — SORBITOL 70 % SOLN
30.0000 mL | Freq: Once | Status: AC
  Administered 2023-12-20: 30 mL via ORAL
  Filled 2023-12-20: qty 30

## 2023-12-20 MED ORDER — INSULIN ASPART 100 UNIT/ML IJ SOLN
0.0000 [IU] | Freq: Three times a day (TID) | INTRAMUSCULAR | Status: DC
  Administered 2023-12-20: 3 [IU] via SUBCUTANEOUS
  Administered 2023-12-21: 2 [IU] via SUBCUTANEOUS
  Administered 2023-12-21: 1 [IU] via SUBCUTANEOUS
  Administered 2023-12-22: 3 [IU] via SUBCUTANEOUS
  Administered 2023-12-22 (×2): 1 [IU] via SUBCUTANEOUS
  Administered 2023-12-23 – 2023-12-24 (×3): 2 [IU] via SUBCUTANEOUS
  Administered 2023-12-24: 07:00:00 1 [IU] via SUBCUTANEOUS
  Filled 2023-12-20: qty 1
  Filled 2023-12-20: qty 2
  Filled 2023-12-20: qty 3
  Filled 2023-12-20: qty 1
  Filled 2023-12-20: qty 2
  Filled 2023-12-20: qty 3
  Filled 2023-12-20 (×2): qty 2
  Filled 2023-12-20 (×2): qty 1

## 2023-12-20 MED ORDER — INSULIN GLARGINE 100 UNIT/ML ~~LOC~~ SOLN
15.0000 [IU] | Freq: Every day | SUBCUTANEOUS | Status: DC
  Administered 2023-12-20: 15 [IU] via SUBCUTANEOUS
  Filled 2023-12-20 (×2): qty 0.15

## 2023-12-20 MED ORDER — IPRATROPIUM-ALBUTEROL 0.5-2.5 (3) MG/3ML IN SOLN
3.0000 mL | Freq: Two times a day (BID) | RESPIRATORY_TRACT | Status: DC
  Administered 2023-12-20 – 2023-12-21 (×2): 3 mL via RESPIRATORY_TRACT
  Filled 2023-12-20 (×2): qty 3

## 2023-12-20 MED ORDER — FUROSEMIDE 20 MG PO TABS
30.0000 mg | ORAL_TABLET | Freq: Every day | ORAL | Status: DC
  Administered 2023-12-21: 30 mg via ORAL
  Filled 2023-12-20: qty 2

## 2023-12-20 MED ORDER — PREDNISONE 20 MG PO TABS
20.0000 mg | ORAL_TABLET | Freq: Every day | ORAL | Status: AC
  Administered 2023-12-21 – 2023-12-22 (×2): 20 mg via ORAL
  Filled 2023-12-20 (×2): qty 1

## 2023-12-20 MED ORDER — INSULIN ASPART 100 UNIT/ML IJ SOLN
0.0000 [IU] | Freq: Every day | INTRAMUSCULAR | Status: DC
  Administered 2023-12-20 – 2023-12-22 (×3): 2 [IU] via SUBCUTANEOUS
  Filled 2023-12-20: qty 3
  Filled 2023-12-20 (×2): qty 2

## 2023-12-20 NOTE — Progress Notes (Signed)
 PROGRESS NOTE   Subjective/Complaints: No new complaints this morning Patient looks much better this morning! Have d/ced amantadine  as per wife's request Increased insulin      12/20/2023    3:00 AM 12/19/2023    7:46 PM 12/19/2023    2:18 PM  Vitals with BMI  Weight 247 lbs 9 oz    BMI 36.54    Systolic  119 123  Diastolic  72 74  Pulse 69 77 68    Recent Labs    12/19/23 1638 12/19/23 2103 12/20/23 0612  GLUCAP 172* 247* 148*     ROS: +diffuse weakness, denies pain,  +urinary urgency, improving ambulation with therapy, shortness of breath has improved,improved cognition today  Objective:   No results found.   Recent Labs    12/19/23 0649  WBC 10.4  HGB 14.2  HCT 43.3  PLT 162   Recent Labs    12/19/23 0649  NA 139  K 3.8  CL 93*  CO2 34*  GLUCOSE 127*  BUN 32*  CREATININE 1.31*  CALCIUM  9.0    Intake/Output Summary (Last 24 hours) at 12/20/2023 0948 Last data filed at 12/20/2023 0827 Gross per 24 hour  Intake 594 ml  Output 1125 ml  Net -531 ml         Physical Exam: Vital Signs Blood pressure 119/72, pulse 69, temperature 97.8 F (36.6 C), temperature source Oral, resp. rate 18, height 5' 9 (1.753 m), weight 112.3 kg, SpO2 94%. Constitutional: No distress . Vital signs reviewed. obese HEENT: NCAT, EOMI, oral membranes moist Neck: supple Cardiovascular: Bradycardia Respiratory/Chest: wheezing more today GI/Abdomen: BS +, non-tender, non-distended Ext: no clubbing, cyanosis, or edema Psych: flat but cooperative  Skin: Bruising noted on bilateral upper extremities--stable Neuro:     Mental Status: AAOx to person and place; partially to time (month as November)  + memory deficits present, intermittant confusion, delayed responses,   Speech/Languate:  severe dysarthria  follows simple commands. Pill rolling tremor bilaterally CRANIAL NERVES: II: PERRL. Visual fields  full III, IV, VI: EOM intact, no gaze preference or deviation V: normal sensation bilaterally VII: no asymmetry VIII: normal hearing to speech IX, X: normal palatal elevation XI: head turn intact b/l XII: Tongue midline     MOTOR: 4-/5 strength throughout SENSORY: Normal to touch all 4 extremities   persistent rigidity in b/l legs Right greater than left--ongoing     Assessment/Plan: 1. Functional deficits which require 3+ hours per day of interdisciplinary therapy in a comprehensive inpatient rehab setting. Physiatrist is providing close team supervision and 24 hour management of active medical problems listed below. Physiatrist and rehab team continue to assess barriers to discharge/monitor patient progress toward functional and medical goals  Care Tool:  Bathing    Body parts bathed by patient: Face, Right arm, Left arm, Chest   Body parts bathed by helper: Abdomen, Front perineal area, Buttocks, Right upper leg, Left upper leg, Right lower leg, Left lower leg     Bathing assist Assist Level: Maximal Assistance - Patient 24 - 49%     Upper Body Dressing/Undressing Upper body dressing   What is the patient wearing?: Pull over shirt  Upper body assist Assist Level: Moderate Assistance - Patient 50 - 74%    Lower Body Dressing/Undressing Lower body dressing      What is the patient wearing?: Pants, Incontinence brief     Lower body assist Assist for lower body dressing: Total Assistance - Patient < 25%     Toileting Toileting    Toileting assist Assist for toileting: Total Assistance - Patient < 25%     Transfers Chair/bed transfer  Transfers assist     Chair/bed transfer assist level: Total Assistance - Patient < 25%     Locomotion Ambulation   Ambulation assist   Ambulation activity did not occur: Safety/medical concerns  Assist level: Minimal Assistance - Patient > 75% Assistive device: Walker-rolling Max distance: 88ft   Walk 10  feet activity   Assist  Walk 10 feet activity did not occur: Safety/medical concerns  Assist level: Minimal Assistance - Patient > 75% Assistive device: Walker-rolling   Walk 50 feet activity   Assist Walk 50 feet with 2 turns activity did not occur: Safety/medical concerns         Walk 150 feet activity   Assist Walk 150 feet activity did not occur: Safety/medical concerns         Walk 10 feet on uneven surface  activity   Assist Walk 10 feet on uneven surfaces activity did not occur: Safety/medical concerns         Wheelchair     Assist Is the patient using a wheelchair?: Yes Type of Wheelchair: Manual    Wheelchair assist level: Dependent - Patient 0% Max wheelchair distance: >341ft    Wheelchair 50 feet with 2 turns activity    Assist        Assist Level: Dependent - Patient 0%   Wheelchair 150 feet activity     Assist      Assist Level: Dependent - Patient 0%   Blood pressure 119/72, pulse 69, temperature 97.8 F (36.6 C), temperature source Oral, resp. rate 18, height 5' 9 (1.753 m), weight 112.3 kg, SpO2 94%.  Medical Problem List and Plan: 1. Functional deficits secondary to generalized weakness due to parkinson disease. Pt has DBS.              -patient may shower             -ELOS/Goals: 14-16, PT sup to CGA, OT mon/mod A, SLP sup to min A             11/30 -pt with worsening dysarthria and weakness this past week. Both HCT and EEG reviewed. No acute changes on CT and EEG demonstrates diffuse slowing. Neurology did not recommend any changes yesterday.    - I don't see any infectious, pharmaceutical, or metabolic reasons for presentation. Labs were ok yesterday   -likely progression of PD unfortunately   -continue to monitor, encourage OOB activity   -perhaps follow up with neurology this week Discussed with patient's family his progress, discussed with palliative care  2.  Antithrombotics: -DVT/anticoagulation:   Pharmaceutical: continue Lovenox              -antiplatelet therapy: ASA  3. Episodes of blank stares: EEG ordered and is consistent with encephalopathy- no seizures evident  4. Mood/Behavior/Sleep: LCSW to follow for evaluation and support.              -antipsychotic agents: N/A  5. Neuropsych/cognition: This patient may be intermittent  capable of making decisions on his own behalf.  6. Skin/Wound  Care: Routine pressure relief measures  7. Class 2 obesity: provided wife with a list of foods for weight loss, continue magnesium  supplement, d/c feeding supplement  8. PD s/p DBS/Autonomic dysfunction: Monitor for orthostatic symptoms with increase in activity             -continue sinemet  ER five times a day.  --continue Pimavanserin  daily to  manage hallucinations  continue metanx D/c amantadine  as per wife's request  9. T2DM: Monitor BS ac/hs, d/c ISS to help get patient on home regimen, provided a list of foods that are good for diabetes             Increase lantus  to 15U, Started metformin  500mg  daily with breakfast, d/c aspart  CBG (last 3)  Recent Labs    12/19/23 1638 12/19/23 2103 12/20/23 0612  GLUCAP 172* 247* 148*    Decrease CBGs to AC 10. HFrEF: Strict I/O. Daily weights and monitor for signs of overload.              --Low salt diet. Continue ASA, Lasix , Toprol  XL and aldactone .    - asked nursing to weigh him Filed Weights   12/16/23 0455 12/17/23 0400 12/20/23 0300  Weight: 115.4 kg 115.4 kg 112.3 kg    11. Glaucoma: Managed with cosopt   12. Hyperactive airway disease: continue Breo. Duonebs started for asthma. Discussed with nursing goal sats 88-92%, continue scheduled prednisone  50mg  daily  13. Thrush: d/c nystatin  since has completed 14 days  14. AKI: resolved, increase daily lasix  back to 40mg  daily  15.  Thrombocytopenia: PC reviewed and is stable   16. Urinary retention: d/c mirabegron . Will monitor voiding with PVR checks. D/c flomax  given  dizziness  17. Constipation: d/c miralax , asked nursing to please offer prune juice, increase magnesium  oxide to 400mg  daily, check magnesium  level 12/11   -Sorbitol ordered 12/11  18. Concern for seizures: monitor for signs/symptoms, EEG ordered and shows no evidence of seizures  ` - see #3  19. Spasticity/dystonia: patient follows with Dr. Carilyn for botox  outpatient  20. Mild vascular congestion and pulmonary edema with mild atelectasis on CXR: encouraged use of incentive spirometer. Discussed with patient/wife/therapy weaning O2 with therapy, incentive spirometer ordered, weight reviewed and is decreased significantly, decrease lasix  to 30mg  daily  21. Dizziness: flomax  d/ced, BP reviewed and is stable, losartan  d/ced, weight is decreased significantly, decrease lasix  to 30mg  due to worsening creatinine  22. Concern for UTI from family: UA reviewed and is negative  LOS: 15 days A FACE TO FACE EVALUATION WAS PERFORMED  Sven SQUIBB Webber Michiels 12/20/2023, 9:48 AM

## 2023-12-20 NOTE — Progress Notes (Signed)
 RN donned 1L O2 nasal cannula d/t patient's sats dropping to 87/88% at rest without O2. Sats returned to 92-95% with 1L

## 2023-12-20 NOTE — Progress Notes (Signed)
 Occupational Therapy Weekly Progress Note  Patient Details  Name: Henry [name] MRN: [id number] Date of Birth: [birth year]  Beginning of progress report period: December 13, 2023 End of progress report period: December 20, 2023  Today's Date: 12/20/2023 OT Individual Time: 1017-1100 OT Individual Time Calculation (min): 43 min    Patient has met 2 of 3 short term goals.  Patient has had some medical challenges this week, and has recently required oxygen.  Some discussion relating to hospice.    Patient continues to demonstrate the following deficits: muscle weakness and muscle joint tightness, decreased cardiorespiratoy endurance, abnormal tone, decreased coordination, and decreased motor planning, decreased initiation, decreased problem solving, decreased memory, and delayed processing, and decreased standing balance, decreased postural control, and decreased balance strategies and therefore will continue to benefit from skilled OT intervention to enhance overall performance with BADL and Reduce care partner burden.  Patient not progressing toward long term goals.  See goal revision..  Plan of care revisions: decreased some goals based on slower than expected progress.  OT Short Term Goals Week 2:  OT Short Term Goal 1 (Week 2): (P) Pt will complete UB bathing with Mod A. OT Short Term Goal 1 - Progress (Week 2): (P) Met OT Short Term Goal 2 (Week 2): (P) Pt will complete UB dressing with Mod A. OT Short Term Goal 2 - Progress (Week 2): (P) Met OT Short Term Goal 3 (Week 2): (P) Patient will complete toilet transfer with min assist OT Short Term Goal 3 - Progress (Week 2): (P) Progressing toward goal Week 3:  OT Short Term Goal 1 (Week 3): (P) STG=LTG due to LOS  Skilled Therapeutic Interventions/Progress Updates:   Patient received sitting in wheelchair.  Reviewed with patient that we needed to check his goals for weekly progress note.  Patient agreeable to bathe upper body and change  into clean shirt while seated at sink.  Patient struggles with process for effectively donning shirt - needing step by step cueing.   Patient with significant bruising on let elbow - provided elbow protector and informed nurse.   Patient completed grooming at sink, and worked on sit to stand and stand to sit with close supervision.   Left up in wheelchair slightly tilted with safety belt in place and enagegd and call bell/ personal items in reach.    Therapy Documentation Precautions:  Precautions Precautions: Fall Recall of Precautions/Restrictions: Impaired Precaution/Restrictions Comments: Parkinson's, deep brain stimulator, R ear deaf Restrictions Weight Bearing Restrictions Per Provider Order: No  Pain:  Denies pain     Therapy/Group: Individual Therapy  Layne Lebon M 12/20/2023, 12:56 PM

## 2023-12-20 NOTE — Progress Notes (Signed)
 Physical Therapy Session Note  Patient Details  Name: Henry [name] MRN: [id number] Date of Birth: 1950/10/17  Today's Date: 12/20/2023 PT Individual Time: 9184-9157 and 8884-8843 PT Individual Time Calculation (min): 27 min and 41 min  Short Term Goals: Week 1:  PT Short Term Goal 1 (Week 1): pt will transfer supine<>sitting EOB with mod A of 1 PT Short Term Goal 1 - Progress (Week 1): Progressing toward goal PT Short Term Goal 2 (Week 1): pt will transfer bed<>chair with LRAD and max A of 1 PT Short Term Goal 2 - Progress (Week 1): Met PT Short Term Goal 3 (Week 1): pt will transfer sit<>stand with max A of 1 PT Short Term Goal 3 - Progress (Week 1): Met Week 2:  PT Short Term Goal 1 (Week 2): pt will transfer supine<>sitting EOB with mod A of 1 PT Short Term Goal 2 (Week 2): pt will transfer sit<>stand with LRAD and min A consistantly PT Short Term Goal 3 (Week 2): pt will propel WC 68ft with CGA  Skilled Therapeutic Interventions/Progress Updates:   Treatment Session 1 Received pt semi-reclined in bed, pt agreeable to PT treatment, and did not c/o pain during session. Pt on 2L via [place] throughout session with no signs of SOB/wheezing today. Session with emphasis on functional mobility/transfers, toileting, dressing, generalized strengthening and endurance, and dynamic standing balance/coordination. Pt transferred semi-reclined<>sitting R EOB with HOB elevated and heavy reliance on bedrails with mod A for trunk control - pt able to advance BLE off EOB without assist this morning! Donned pants and shoes sitting EOB with max A and pt reported urge to toilet. Transferred bed<>TIS WC<>to/from toilet with bedside commode over top with RW and grab bar and min A (did not ambulate due to EVS just mopping floor). Pt required max A for clothing management and able to void. Dependent for hygiene management in standing and concluded session with pt sitting in TIS WC, needs within reach, and seatbelt  alarm on.   Treatment Session 2 Received pt sitting in TIS WC, pt agreeable to PT treatment, and did not c/o pain during session. Session with emphasis on functional mobility/transfers, generalized strengthening and endurance, dynamic standing balance/coordination, and gait training. Pt transported to/from room in TIS WC dependently. Pt performed all transfers with RW and CGA/min A throughout session. Pt ambulated 146ft with weighted RW and CGA/min A! Pt continues to ambulate with festinating gait pattern with forward head/flexed trunk and tendency to push RW too far forward. Avoided cueing pt to prevent confusing pt and causing freezing episode.   Pt requested to work on core strengthening to assist with getting up OOB. Performed the following seated exercises with emphasis on core strength: -lateral trunk rotations with 4.4lb medicine ball 2x10 bilaterally -shoulder extensions into physioball 2x10 with 5 second hold Ambulated additional 31ft with RW and CGA (limited by fatigue), then transported back to room in TIS WC dependently. Concluded session with pt sitting in TIS WC, needs within reach, and seatbelt alarm on with visitor present.   Therapy Documentation Precautions:  Precautions Precautions: Fall Recall of Precautions/Restrictions: Impaired Precaution/Restrictions Comments: Parkinson's, deep brain stimulator, R ear deaf Restrictions Weight Bearing Restrictions Per Provider Order: No  Therapy/Group: Individual Therapy [name] [name] PT, DPT 12/20/2023, 6:50 AM

## 2023-12-20 NOTE — Plan of Care (Signed)
 Goals downgraded to reflect slower than expected progress.   Problem: RH Bathing Goal: LTG Patient will bathe all body parts with assist levels (OT) Description: LTG: Patient will bathe all body parts with assist levels (OT) Flowsheets (Taken 12/20/2023 1251) LTG: Pt will perform bathing with assistance level/cueing: Maximal Assistance - Patient 25 - 49%   Problem: RH Toileting Goal: LTG Patient will perform toileting task (3/3 steps) with assistance level (OT) Description: LTG: Patient will perform toileting task (3/3 steps) with assistance level (OT)  Flowsheets (Taken 12/20/2023 1251) LTG: Pt will perform toileting task (3/3 steps) with assistance level: Moderate Assistance - Patient 50 - 74%   Problem: RH Tub/Shower Transfers Goal: LTG Patient will perform tub/shower transfers w/assist (OT) Description: LTG: Patient will perform tub/shower transfers with assist, with/without cues using equipment (OT) Flowsheets (Taken 12/20/2023 1251) LTG: Pt will perform tub/shower stall transfers with assistance level of: Moderate Assistance - Patient 50 - 74%

## 2023-12-20 NOTE — Progress Notes (Signed)
 Speech Language Pathology Daily Session Note  Patient Details  Name: Henry [name] MRN: [id number] Date of Birth: Dec 06, 1950  Today's Date: 12/20/2023 SLP Individual Time: 9099-9041 [id number] SLP Individual Time Calculation (min): 58 min 45 minutes  Short Term Goals: Week 2: SLP Short Term Goal 1 (Week 2): Pt will utilize compensatory speech strategies to maintain 80% intelligibility or greater at phrase level given modA SLP Short Term Goal 2 (Week 2): Patient will complete SPEAK OUT! vocal intensity exercises with mod assist and use of visual aid. SLP Short Term Goal 3 (Week 2): Pt will solve simple problems w/ minA SLP Short Term Goal 4 (Week 2): Pt will sustain attention for ~15 mins w/ modA SLP Short Term Goal 5 (Week 2): Pt will tolerate regular textures w/ thin liquids w/ no overt s/s of airway invasion 95% of the time or greater SLP Short Term Goal 6 (Week 2): Pt will recall recent/relevant info w/ modA  Skilled Therapeutic Interventions: Session 1: Skilled therapy session focused on cognitive goals. SLP facilitated session by building rapport with patient through discussion re: biographical information. Patient shared information about self, family and job hx independently. SLP then targeted problem solving goals through medication management task. SLP provided up to date medication list from [facility]. Patient utilized verbalized instructions to complete portion of BID pillbox with max-totalA to follow directions secondary to attention deficits and lethargy. Activity was d/c due to increasing difficulty as session progressed. SLP then targeted simple problem solving skills through discussion re: call bell. Patient ID call bell and recalled uses with minA. Patient left in [facility] with alarm set and call bell in reach. Continue POC Session 2:  Skilled therapy session focused on communication goals. SLP facilitated session by prompting completion of SPEAK OUT! activities. Patient completed warm up  exercises to increase vocal intensity and over articulation with modA. SLP continued to challenge patient at the phrase level. Patient reached 85% intelligibility given modA. Patient left in [employer] with alarm set and call bell in reach. Continue POC.   Pain Session 1: none reported  Session 2: none reported   Therapy/Group: Individual Therapy  Kyilee Gregg M.A., CCC-SLP 12/20/2023, 7:46 AM

## 2023-12-20 NOTE — Progress Notes (Signed)
 Progress Note   Patient: Henry [name] FMW:[id number] DOB: Jul 28, 1950 DOA: 12/05/2023     15 DOS: the patient was seen and examined on 12/20/2023     Brief hospital course:  Henry [name] is a 73 y.o. male with medical history significant of hypertension, hyperlipidemia, diabetes, hypothyroidism, CAD status post CABG, GERD, gout, CKD 3, chronic systolic CHF, status post AICD, RLS, Parkinson disease, status post deep brain stimulator, neuropathy, asthma, OSA, obesity who presented to rehab after inpatient stay. Patient was admitted 11/18-11/26.   Initially came in with weakness for 2 to 3 weeks with episodes of facial asymmetry.  Symptoms appear to be worse after changing settings of his deep brain stimulator and neurology office.   Patient was worked up and evaluated by neurology while admitted.  CT head remained stable while admitted and unable to obtain MRI brain due to deep brain stimulator.  Patient was worked up for seizure and EEG was negative for any evidence of seizure.  It is thought that his presenting symptoms are likely due to accommodation of exacerbation of his Parkinson's disease, change in the brain stimulator settings, deconditioning.  PT recommended CIR upon discharge.   Patient has been at [facility] since 11/26.  Recently has developed some issues with shortness of breath, wheezing.     Chest x-ray 12/7: Mild pulmonary edema.  Patient also received albuterol  nebulizer.       Assessment and Plan:   Asthma exacerbation Patient presented with shortness of breath and wheezing High suspicion for asthma at this time however if symptoms persist could consider vocal cord dysfunction. Continue as needed nebulization Has been weaned off oxygen today - Schedule DuoNebs while awake overnight - While off steroid therapy - Check BNP to rule out CHF - Continue formulary Breo in place of home Wixela - As needed albuterol  - Respiratory panel negative   Chronic systolic CHF Last  echo was last year with EF  30%, G2 DD, mildly reduced RV function. Mild edema on chest x-ray.  - Continue with p.o. Lasix  - Continue spironolactone , losartan , metoprolol    Parkinson disease Status post DBS - Continue Sinemet    Hypertension - Has been continued on home Lasix , spironolactone , losartan , metoprolol    Hyperlipidemia - Remains on home Vascepa    Hypothyroidism - Continue with Synthroid    CAD Status post CABG - Continue home losartan , metoprolol , spironolactone , ASA   CKD 3 Creatinine stable - Continue to trend labs   Diabetes with hyperglycemia - Continue with insulin  per primary team   OSA  History of intolerance with CPAP due to panic attacks - Continue oxygen qHS   Obesity - Noted   TRH will continue to follow the patient.   Family Communication: None at time of evaluation   Thank you very much for involving us  in the care of your patient.     Subjective:  Patient doing much better today Respiratory function improved and has been weaned off oxygen Glucose level slightly elevated for which prednisone  is being weaned off He denies nausea vomiting or chest pain   Physical Exam Constitutional:      General: He is not in acute distress.    Appearance: Normal appearance.  HENT:     Head: Normocephalic and atraumatic.  Eyes:     Extraocular Movements: Extraocular movements intact.     Pupils: Pupils are equal, round, and reactive to light.  Cardiovascular:     Rate and Rhythm: Normal rate and regular rhythm.     Pulses: Normal  pulses.     Heart sounds: Normal heart sounds.  Pulmonary: Air entry decreased bilaterally especially at the bases, no wheezing Abdominal:     General: Bowel sounds are normal. There is no distension.     Palpations: Abdomen is soft.     Tenderness: There is no abdominal tenderness.  Musculoskeletal:        General: No swelling or deformity.  Skin:    General: Skin is warm and dry.  Neurological:     General: No  focal deficit present.     Mental Status: Mental status is at baseline    Data Reviewed:     Vitals:   12/19/23 1946 12/19/23 2047 12/20/23 0300 12/20/23 0912  BP: 119/72     Pulse: 77  69   Resp: 18  18   Temp: 97.7 F (36.5 C)  97.8 F (36.6 C)   TempSrc:   Oral   SpO2: 95% 95% 95% 94%  Weight:   112.3 kg   Height:          Latest Ref Rng & Units 12/19/2023    6:49 AM 12/17/2023    6:23 AM 12/13/2023    8:02 AM  CBC  WBC 4.0 - 10.5 K/uL 10.4  6.2  6.3   Hemoglobin 13.0 - 17.0 g/dL 85.7  86.8  86.5   Hematocrit 39.0 - 52.0 % 43.3  40.5  41.5   Platelets 150 - 400 K/uL 162  142  144        Latest Ref Rng & Units 12/20/2023   10:18 AM 12/19/2023    6:49 AM 12/17/2023    6:23 AM  BMP  Glucose 70 - 99 mg/dL 834  872  877   BUN 8 - 23 mg/dL 36  32  22   Creatinine 0.61 - 1.24 mg/dL 8.65  8.68  8.86   Sodium 135 - 145 mmol/L 137  139  137   Potassium 3.5 - 5.1 mmol/L 3.9  3.8  4.1   Chloride 98 - 111 mmol/L 95  93  95   CO2 22 - 32 mmol/L 36  34  32   Calcium  8.9 - 10.3 mg/dL 9.0  9.0  8.5      Author: Drue ONEIDA Potter, MD 12/20/2023 3:47 PM  For on call review [email or web address].

## 2023-12-21 ENCOUNTER — Inpatient Hospital Stay (HOSPITAL_COMMUNITY)

## 2023-12-21 DIAGNOSIS — R0902 Hypoxemia: Secondary | ICD-10-CM | POA: Diagnosis not present

## 2023-12-21 DIAGNOSIS — I517 Cardiomegaly: Secondary | ICD-10-CM | POA: Diagnosis not present

## 2023-12-21 DIAGNOSIS — R0989 Other specified symptoms and signs involving the circulatory and respiratory systems: Secondary | ICD-10-CM | POA: Diagnosis not present

## 2023-12-21 DIAGNOSIS — Z95810 Presence of automatic (implantable) cardiac defibrillator: Secondary | ICD-10-CM | POA: Diagnosis not present

## 2023-12-21 LAB — BASIC METABOLIC PANEL WITH GFR
Anion gap: 7 (ref 5–15)
BUN: 34 mg/dL — ABNORMAL HIGH (ref 8–23)
CO2: 39 mmol/L — ABNORMAL HIGH (ref 22–32)
Calcium: 8.8 mg/dL — ABNORMAL LOW (ref 8.9–10.3)
Chloride: 95 mmol/L — ABNORMAL LOW (ref 98–111)
Creatinine, Ser: 1.41 mg/dL — ABNORMAL HIGH (ref 0.61–1.24)
GFR, Estimated: 53 mL/min — ABNORMAL LOW (ref >60)
Glucose, Bld: 143 mg/dL — ABNORMAL HIGH (ref 70–99)
Potassium: 4.5 mmol/L (ref 3.5–5.1)
Sodium: 141 mmol/L (ref 135–145)

## 2023-12-21 LAB — GLUCOSE, CAPILLARY
Glucose-Capillary: 147 mg/dL — ABNORMAL HIGH (ref 70–99)
Glucose-Capillary: 118 mg/dL — ABNORMAL HIGH (ref 70–99)
Glucose-Capillary: 199 mg/dL — ABNORMAL HIGH (ref 70–99)
Glucose-Capillary: 203 mg/dL — ABNORMAL HIGH (ref 70–99)

## 2023-12-21 MED ORDER — ARFORMOTEROL TARTRATE 15 MCG/2ML IN NEBU: 15.0000 ug | INHALATION_SOLUTION | Freq: Two times a day (BID) | RESPIRATORY_TRACT | Status: DC

## 2023-12-21 MED ORDER — INSULIN GLARGINE 100 UNIT/ML ~~LOC~~ SOLN
16.0000 [IU] | Freq: Every day | SUBCUTANEOUS | Status: DC
  Administered 2023-12-21 – 2023-12-23 (×3): 16 [IU] via SUBCUTANEOUS
  Filled 2023-12-21 (×4): qty 0.16

## 2023-12-21 MED ORDER — METOPROLOL SUCCINATE ER 25 MG PO TB24
25.0000 mg | ORAL_TABLET | Freq: Every day | ORAL | 0 refills | Status: DC
  Filled 2023-12-21: qty 30, 30d supply, fill #0

## 2023-12-21 MED ORDER — L-METHYLFOLATE-B6-B12 3-35-2 MG PO TABS
1.0000 | ORAL_TABLET | Freq: Every day | ORAL | 0 refills | Status: DC
  Filled 2023-12-21: qty 30, 30d supply, fill #0

## 2023-12-21 MED ORDER — ACETAMINOPHEN 325 MG PO TABS: 650.0000 mg | ORAL_TABLET | Freq: Four times a day (QID) | ORAL | Status: DC | PRN

## 2023-12-21 MED ORDER — DEXTROMETHORPHAN-GUAIFENESIN 20-200 MG/20ML PO LIQD
10.0000 mL | Freq: Four times a day (QID) | ORAL | 0 refills | Status: DC | PRN
  Filled 2023-12-21: qty 118, 3d supply, fill #0

## 2023-12-21 MED ORDER — POLYETHYLENE GLYCOL 3350 17 GM/SCOOP PO POWD
17.0000 g | Freq: Every day | ORAL | 0 refills | Status: DC
  Filled 2023-12-21: qty 238, 14d supply, fill #0

## 2023-12-21 MED ORDER — BUDESONIDE 0.25 MG/2ML IN SUSP
0.2500 mg | Freq: Two times a day (BID) | RESPIRATORY_TRACT | 0 refills | Status: DC
  Filled 2023-12-21: qty 120, 30d supply, fill #0

## 2023-12-21 MED ORDER — BUDESONIDE 0.25 MG/2ML IN SUSP: 0.2500 mg | Freq: Two times a day (BID) | RESPIRATORY_TRACT | Status: DC

## 2023-12-21 MED ORDER — MAGNESIUM OXIDE -MG SUPPLEMENT 400 (240 MG) MG PO TABS
400.0000 mg | ORAL_TABLET | Freq: Every day | ORAL | 0 refills | Status: DC
  Filled 2023-12-21: qty 30, 30d supply, fill #0

## 2023-12-21 MED ORDER — SPIRONOLACTONE 25 MG PO TABS
12.5000 mg | ORAL_TABLET | Freq: Every day | ORAL | 0 refills | Status: DC
  Filled 2023-12-21: qty 15, 30d supply, fill #0

## 2023-12-21 MED ORDER — FUROSEMIDE 20 MG PO TABS
40.0000 mg | ORAL_TABLET | Freq: Every day | ORAL | 0 refills | Status: DC
  Filled 2023-12-21: qty 60, 30d supply, fill #0

## 2023-12-21 MED ORDER — BUDESONIDE 0.25 MG/2ML IN SUSP
0.2500 mg | Freq: Two times a day (BID) | RESPIRATORY_TRACT | Status: DC
  Administered 2023-12-21 – 2023-12-24 (×4): 0.25 mg via RESPIRATORY_TRACT
  Filled 2023-12-21 (×4): qty 2

## 2023-12-21 MED ORDER — ARFORMOTEROL TARTRATE 15 MCG/2ML IN NEBU
15.0000 ug | INHALATION_SOLUTION | Freq: Two times a day (BID) | RESPIRATORY_TRACT | 0 refills | Status: DC
  Filled 2023-12-21: qty 120, 30d supply, fill #0

## 2023-12-21 MED ORDER — ARFORMOTEROL TARTRATE 15 MCG/2ML IN NEBU
15.0000 ug | INHALATION_SOLUTION | Freq: Two times a day (BID) | RESPIRATORY_TRACT | Status: DC
  Administered 2023-12-21 – 2023-12-24 (×4): 15 ug via RESPIRATORY_TRACT
  Filled 2023-12-21 (×5): qty 2

## 2023-12-21 MED ORDER — MELATONIN 5 MG PO TABS
5.0000 mg | ORAL_TABLET | Freq: Every evening | ORAL | 0 refills | Status: DC | PRN
  Filled 2023-12-21: qty 30, 30d supply, fill #0

## 2023-12-21 MED ORDER — FUROSEMIDE 40 MG PO TABS
40.0000 mg | ORAL_TABLET | Freq: Every day | ORAL | Status: DC
  Administered 2023-12-22 – 2023-12-24 (×3): 40 mg via ORAL
  Filled 2023-12-21 (×3): qty 1

## 2023-12-21 MED ORDER — POLYETHYLENE GLYCOL 3350 17 G PO PACK
17.0000 g | PACK | Freq: Every day | ORAL | Status: DC
  Administered 2023-12-21 – 2023-12-24 (×4): 17 g via ORAL
  Filled 2023-12-21 (×4): qty 1

## 2023-12-21 NOTE — Progress Notes (Signed)
 Patient ID: Henry [name], male   DOB: 07/18/1950, 73 y.o.   MRN: [id number]  Updated discharge date to Monday per family request. [facility] to redeliver DME prior to discharge.   [facility] transport for 1pm 12/15.

## 2023-12-21 NOTE — Progress Notes (Addendum)
 Speech Language Pathology Daily Session Note  Patient Details  Name: Henry [name] MRN: [id number] Date of Birth: 1950/05/03  Today's Date: 12/21/2023 SLP Individual Time: 1400-1435 SLP Individual Time Calculation (min): 35 min  Short Term Goals: Week 2: SLP Short Term Goal 1 (Week 2): Pt will utilize compensatory speech strategies to maintain 80% intelligibility or greater at phrase level given modA SLP Short Term Goal 2 (Week 2): Patient will complete SPEAK OUT! vocal intensity exercises with mod assist and use of visual aid. SLP Short Term Goal 3 (Week 2): Pt will solve simple problems w/ minA SLP Short Term Goal 4 (Week 2): Pt will sustain attention for ~15 mins w/ modA SLP Short Term Goal 5 (Week 2): Pt will tolerate regular textures w/ thin liquids w/ no overt s/s of airway invasion 95% of the time or greater SLP Short Term Goal 6 (Week 2): Pt will recall recent/relevant info w/ modA  Skilled Therapeutic Interventions: Skilled therapy session focused on communication goals. SLP facilitated session by prompting completion of SPEAK OUT! activities. Patient completed warm up exercises to increase vocal intensity and over articulation with mod-maxA. Patient extremely lethargic with increased WOB and difficulty with increasing sustained phonation secondary to poor respiratory support. Patient O2 fluctuating from 85-92. SLP returned patient to room and notified RN. 25 minutes of session missed due to patient lethargy and nursing care: oxygen. Patient left in chair with nursing present. Continue POC.   Pain None reported- RN aware of low O2 sats  Therapy/Group: Individual Therapy  [name] M.A., CCC-SLP 12/21/2023, 7:44 AM

## 2023-12-21 NOTE — Progress Notes (Addendum)
 PROGRESS NOTE   Subjective/Complaints: No new complaints this morning Is less alert this morning Required 2-3L over night, and is now down to 1L     12/21/2023    5:16 AM 12/21/2023    5:00 AM 12/20/2023    7:59 PM  Vitals with BMI  Weight  252 lbs 7 oz   BMI  37.26   Systolic 114    Diastolic 82    Pulse 71  73    Recent Labs    12/20/23 1642 12/20/23 2138 12/21/23 0531  GLUCAP 228* 203* 147*     ROS: +diffuse weakness, denies pain,  +urinary urgency, improving ambulation with therapy, shortness of breath has improved, worsening cognition today- fluctuates on a daily basis  Objective:   No results found.   Recent Labs    12/19/23 0649  WBC 10.4  HGB 14.2  HCT 43.3  PLT 162   Recent Labs    12/20/23 1018 12/21/23 0548  NA 137 141  K 3.9 4.5  CL 95* 95*  CO2 36* 39*  GLUCOSE 165* 143*  BUN 36* 34*  CREATININE 1.34* 1.41*  CALCIUM  9.0 8.8*    Intake/Output Summary (Last 24 hours) at 12/21/2023 1028 Last data filed at 12/21/2023 0738 Gross per 24 hour  Intake 480 ml  Output 550 ml  Net -70 ml         Physical Exam: Vital Signs Blood pressure 114/82, pulse 71, temperature 98.7 F (37.1 C), resp. rate 17, height 5' 9 (1.753 m), weight 114.5 kg, SpO2 94%. Constitutional: No distress . Vital signs reviewed. obese HEENT: NCAT, EOMI, oral membranes moist Neck: supple Cardiovascular: Bradycardia Respiratory/Chest: wheezing more today GI/Abdomen: BS +, non-tender, non-distended Ext: no clubbing, cyanosis, or edema Psych: flat but cooperative  Skin: Bruising noted on bilateral upper extremities--stable Neuro:     Mental Status: AAOx to person and place; partially to time (month as November)  + memory deficits present, intermittant confusion, delayed responses,   Speech/Languate:  severe dysarthria  follows simple commands. Pill rolling tremor bilaterally CRANIAL NERVES: II: PERRL.  Visual fields full III, IV, VI: EOM intact, no gaze preference or deviation V: normal sensation bilaterally VII: no asymmetry VIII: normal hearing to speech IX, X: normal palatal elevation XI: head turn intact b/l XII: Tongue midline     MOTOR: 4-/5 strength throughout, stable 12/12 SENSORY: Normal to touch all 4 extremities   persistent rigidity in b/l legs Right greater than left--ongoing     Assessment/Plan: 1. Functional deficits which require 3+ hours per day of interdisciplinary therapy in a comprehensive inpatient rehab setting. Physiatrist is providing close team supervision and 24 hour management of active medical problems listed below. Physiatrist and rehab team continue to assess barriers to discharge/monitor patient progress toward functional and medical goals  Care Tool:  Bathing    Body parts bathed by patient: Face, Right arm, Left arm, Chest   Body parts bathed by helper: Abdomen, Front perineal area, Buttocks, Right upper leg, Left upper leg, Right lower leg, Left lower leg     Bathing assist Assist Level: Maximal Assistance - Patient 24 - 49%     Upper Body Dressing/Undressing Upper  body dressing   What is the patient wearing?: Pull over shirt    Upper body assist Assist Level: Moderate Assistance - Patient 50 - 74%    Lower Body Dressing/Undressing Lower body dressing      What is the patient wearing?: Pants, Incontinence brief     Lower body assist Assist for lower body dressing: Total Assistance - Patient < 25%     Toileting Toileting    Toileting assist Assist for toileting: Total Assistance - Patient < 25%     Transfers Chair/bed transfer  Transfers assist     Chair/bed transfer assist level: Total Assistance - Patient < 25%     Locomotion Ambulation   Ambulation assist   Ambulation activity did not occur: Safety/medical concerns  Assist level: Minimal Assistance - Patient > 75% Assistive device: Walker-rolling Max  distance: 19ft   Walk 10 feet activity   Assist  Walk 10 feet activity did not occur: Safety/medical concerns  Assist level: Minimal Assistance - Patient > 75% Assistive device: Walker-rolling   Walk 50 feet activity   Assist Walk 50 feet with 2 turns activity did not occur: Safety/medical concerns         Walk 150 feet activity   Assist Walk 150 feet activity did not occur: Safety/medical concerns         Walk 10 feet on uneven surface  activity   Assist Walk 10 feet on uneven surfaces activity did not occur: Safety/medical concerns         Wheelchair     Assist Is the patient using a wheelchair?: Yes Type of Wheelchair: Manual    Wheelchair assist level: Dependent - Patient 0% Max wheelchair distance: >33ft    Wheelchair 50 feet with 2 turns activity    Assist        Assist Level: Dependent - Patient 0%   Wheelchair 150 feet activity     Assist      Assist Level: Dependent - Patient 0%   Blood pressure 114/82, pulse 71, temperature 98.7 F (37.1 C), resp. rate 17, height 5' 9 (1.753 m), weight 114.5 kg, SpO2 94%.  Medical Problem List and Plan: 1. Functional deficits secondary to generalized weakness due to parkinson disease. Pt has DBS.              -patient may shower             -ELOS/Goals: 14-16, PT sup to CGA, OT mon/mod A, SLP sup to min A             11/30 -pt with worsening dysarthria and weakness this past week. Both HCT and EEG reviewed. No acute changes on CT and EEG demonstrates diffuse slowing. Neurology did not recommend any changes yesterday.    - I don't see any infectious, pharmaceutical, or metabolic reasons for presentation. Labs were ok yesterday   -likely progression of PD unfortunately   -continue to monitor, encourage OOB activity   -perhaps follow up with neurology this week Discussed with patient's family his progress, discussed with palliative care  2.  Antithrombotics: -DVT/anticoagulation:   Pharmaceutical: continue Lovenox              -antiplatelet therapy: ASA  3. Episodes of blank stares: EEG ordered and is consistent with encephalopathy- no seizures evident  4. Mood/Behavior/Sleep: LCSW to follow for evaluation and support.              -antipsychotic agents: N/A  5. Neuropsych/cognition: This patient may be intermittent  capable of making decisions on his own behalf.  6. Skin/Wound Care: Routine pressure relief measures  7. Class 2 obesity: provided wife with a list of foods for weight loss, continue magnesium  supplement, d/c feeding supplement  8. PD s/p DBS/Autonomic dysfunction: Monitor for orthostatic symptoms with increase in activity             -continue sinemet  ER five times a day.  --continue Pimavanserin  daily to  manage hallucinations  continue metanx D/c amantadine  as per wife's request Deep brain stimulator was turned back on as per wife  9. T2DM: Monitor BS ac/hs, d/c ISS to help get patient on home regimen, provided a list of foods that are good for diabetes             Increase Lantus  to 16U, Started metformin  500mg  daily with breakfast, d/c aspart  CBG (last 3)  Recent Labs    12/20/23 1642 12/20/23 2138 12/21/23 0531  GLUCAP 228* 203* 147*    Decrease CBGs to AC  Prednisone  decreased to 20mg  daily 10. HFrEF: Strict I/O. Daily weights and monitor for signs of overload.              --Low salt diet. Continue ASA, Lasix , Toprol  XL and aldactone .    - asked nursing to weigh him Filed Weights   12/17/23 0400 12/20/23 0300 12/21/23 0500  Weight: 115.4 kg 112.3 kg 114.5 kg    11. Glaucoma: Continue cosopt   12. Hyperactive airway disease: continue Breo. Duonebs started for asthma. Discussed with nursing goal sats 88-92%, continue 20mg  prednisone  daily  13. Thrush: d/c nystatin  since has completed 14 days  14. AKI: repeat BMP tomorrow, increase daily lasix  back to 40mg  daily  15.  Thrombocytopenia: PC reviewed and is stable   16. Urinary  retention: d/c mirabegron . Will monitor voiding with PVR checks. D/c flomax  given dizziness  17. Constipation: d/c miralax , asked nursing to please offer prune juice, increase magnesium  oxide to 400mg  daily, magnesium  level reviewed 12/11 and is    -Sorbitol ordered 12/11 with no results, mirlax ordered 12/12, asked nursing to try suppository if this is ineffective today  18. Concern for seizures: monitor for signs/symptoms, EEG ordered and shows no evidence of seizures  ` - see #3  19. Spasticity/dystonia: patient follows with Dr. Carilyn for botox  outpatient  20. Mild vascular congestion and pulmonary edema with mild atelectasis on CXR: encouraged use of incentive spirometer. Discussed with patient/wife/therapy weaning O2 with therapy, incentive spirometer ordered, increase lasix  back to 40mg  daily since weight has increased  21. Dizziness: flomax  d/ced, BP reviewed and is stable, losartan  d/ced, weight is decreased significantly, decrease lasix  to 30mg  due to worsening creatinine  22. Concern for UTI from family: UA reviewed and is negative  LOS: 16 days A FACE TO FACE EVALUATION WAS PERFORMED  Kaylina Cahue P Arwilda [place] 12/21/2023, 10:28 AM

## 2023-12-21 NOTE — Plan of Care (Signed)
 Goals downgraded due to current progress Problem: RH Swallowing Goal: LTG Patient will consume least restrictive diet using compensatory strategies with assistance (SLP) Description: LTG:  Patient will consume least restrictive diet using compensatory strategies with assistance (SLP) Flowsheets (Taken 12/21/2023 1545) LTG: Pt Patient will consume least restrictive diet using compensatory strategies with assistance of (SLP): Minimal Assistance - Patient > 75%   Problem: RH Expression Communication Goal: LTG Patient will express needs/wants via multi-modal(SLP) Description: LTG:  Patient will express needs/wants via multi-modal communication (gestures/written, etc) with cues (SLP) Flowsheets (Taken 12/21/2023 1545) LTG: Patient will express needs/wants via multimodal communication (gestures/written, etc) with cueing (SLP): Moderate Assistance - Patient 50 - 74% Goal: LTG Patient will increase speech intelligibility (SLP) Description: LTG: Patient will increase speech intelligibility at word/phrase/conversation level with cues, % of the time (SLP) Flowsheets Taken 12/21/2023 1545 by Kaavya Puskarich F, CCC-SLP LTG: Patient will increase speech intelligibility (SLP): Moderate Assistance - Patient 50 - 74% Level: Phrase Taken 12/07/2023 1613 by Berna Recardo LABOR, CCC-SLP Percent of time patient will use intelligible speech: 85   Problem: RH Problem Solving Goal: LTG Patient will demonstrate problem solving for (SLP) Description: LTG:  Patient will demonstrate problem solving for basic/complex daily situations with cues  (SLP) Flowsheets Taken 12/21/2023 1545 by Adeeb Konecny F, CCC-SLP LTG Patient will demonstrate problem solving for: Moderate Assistance - Patient 50 - 74% Taken 12/07/2023 1613 by Berna Recardo LABOR, CCC-SLP LTG: Patient will demonstrate problem solving for (SLP): Basic daily situations   Problem: RH Memory Goal: LTG Patient will use memory compensatory aids to  (SLP) Description: LTG:  Patient will use memory compensatory aids to recall biographical/new, daily complex information with cues (SLP) Flowsheets (Taken 12/21/2023 1545) LTG: Patient will use memory compensatory aids to (SLP): Moderate Assistance - Patient 50 - 74%   Problem: RH Attention Goal: LTG Patient will demonstrate this level of attention during functional activites (SLP) Description: LTG:  Patient will will demonstrate this level of attention during functional activites (SLP) Flowsheets (Taken 12/21/2023 1545) Patient will demonstrate during cognitive/linguistic activities the attention type of: Sustained Patient will demonstrate this level of attention during cognitive/linguistic activities in: Controlled LTG: Patient will demonstrate this level of attention during cognitive/linguistic activities with assistance of (SLP): Moderate Assistance - Patient 50 - 74% Number of minutes patient will demonstrate attention during cognitive/linguistic activities: 10

## 2023-12-21 NOTE — Progress Notes (Signed)
 Physical Therapy Weekly Progress Note  Patient Details  Name: Henry [name] MRN: [id number] Date of Birth: Aug 20, 1950  Beginning of progress report period: December 07, 2023 End of progress report period: December 21, 2023  Patient has met 1 of 3 short term goals. Pt has recently been fluctuating with progress towards long term goals. Pt is able to perform bed mobility with as little as mod A using bed features (but can be as much as max A), sit<>stands and stand<>pivot transfers with RW and as little as CGA/min A (but can be as much as mod A), and has ambulated up to 13ft with (weighted) RW and min A. However, pt can experience freezing episodes preventing him from being able to walk at all and severely impacting sequencing when turning. Pt has also fluctuated with being on RA and requiring up to 2L. Pt will require hands on family education training prior to discharge.   Patient continues to demonstrate the following deficits muscle weakness, decreased cardiorespiratoy endurance and decreased oxygen support, impaired timing and sequencing, unbalanced muscle activation, decreased coordination, and decreased motor planning, decreased initiation, decreased awareness, decreased problem solving, decreased safety awareness, and decreased memory, and decreased standing balance, decreased postural control, and decreased balance strategies and therefore will continue to benefit from skilled PT intervention to increase functional independence with mobility.  Patient progressing toward long term goals..  Continue plan of care.  PT Short Term Goals Week 2:  PT Short Term Goal 1 (Week 2): pt will transfer supine<>sitting EOB with mod A of 1 PT Short Term Goal 1 - Progress (Week 2): Met PT Short Term Goal 2 (Week 2): pt will transfer sit<>stand with LRAD and min A consistantly PT Short Term Goal 2 - Progress (Week 2): Progressing toward goal PT Short Term Goal 3 (Week 2): pt will propel WC 21ft with  CGA PT Short Term Goal 3 - Progress (Week 2): Progressing toward goal Week 3:  PT Short Term Goal 1 (Week 3): STG = LTG due to LOS  Skilled Therapeutic Interventions/Progress Updates:  Ambulation/gait training;Discharge planning;Functional mobility training;Psychosocial support;Therapeutic Activities;Visual/perceptual remediation/compensation;Balance/vestibular training;Disease management/prevention;Neuromuscular re-education;Skin care/wound management;Therapeutic Exercise;Wheelchair propulsion/positioning;Cognitive remediation/compensation;DME/adaptive equipment instruction;Pain management;Splinting/orthotics;UE/LE Strength taining/ROM;Community reintegration;Functional electrical stimulation;Patient/family education;UE/LE Coordination activities   Therapy Documentation Precautions:  Precautions Precautions: Fall Recall of Precautions/Restrictions: Impaired Precaution/Restrictions Comments: Parkinson's, deep brain stimulator, R ear deaf Restrictions Weight Bearing Restrictions Per Provider Order: No  Therapy/Group: Individual Therapy [name] [name] PT, DPT 12/21/2023, 6:53 AM

## 2023-12-21 NOTE — Progress Notes (Signed)
 [facility] Liaison Note   [facility] continues to follow for discharge disposition for hospice services at home.   Per conversation with ICM [name], the plan for discharge has changed to 12.19. As a result, the DME will need to be picked up and delivered again prior to discharge. Family aware and in agreement.    Please call with any hospice related questions or concerns.  Eleanor Nail, LPN [facility] Liaison [phone]

## 2023-12-21 NOTE — Progress Notes (Signed)
° °  Palliative Medicine Inpatient Follow Up Note HPI: Henry [name] is a 73 y.o. male with medical history significant of hypertension, hyperlipidemia, diabetes, hypothyroidism, CAD status post CABG, GERD, gout, CKD 3, chronic systolic CHF, status post AICD, RLS, Parkinson disease, status post deep brain stimulator, neuropathy, asthma, OSA, obesity who presented to rehab after inpatient stay. Palliative care has been asked to support additional goals of care conversations.   Today's Discussion 12/21/2023  *Please note that this is a verbal dictation therefore any spelling or grammatical errors are due to the Dragon [employer] One system interpretation.  I reviewed the chart notes including nursing notes from today, progress notes from today. I also reviewed vital signs, nursing flowsheets, medication administrations record, labs, and imaging.    Oral Intake %:  100% I/O:  (-) 60ml Bowel Movements:  Last 12/7 Mobility: Able to mobilize in hall with PT  Henry [name] is more alert today though his ability to remain coherent appears short lasting.He is not in pain or nauseated. He does have some SOB. Created space and opportunity for patient to explore thoughts feelings and fears regarding current medical situation.  He has the desire to continue rehabilitation at this time.   I called and spoke with patients wife, Henry [name] who shares understanding of his current health condition(s). She shares we can reassess his needs nest week to better evaluate what Henry [name] will need in the home for care.  Plan to continue rehab until 12/19 and to reassess if hospice will be initiated thereafter or not.   Questions and concerns addressed/Palliative Support Provided.   Objective Assessment: Vital Signs Vitals:   12/21/23 0849 12/21/23 0855  BP:    Pulse:    Resp:    Temp:    SpO2: 93% 94%    Intake/Output Summary (Last 24 hours) at 12/21/2023 1213 Last data filed at 12/21/2023 9261 Gross per 24 hour   Intake 480 ml  Output 550 ml  Net -70 ml   Last Weight  Most recent update: 12/21/2023  5:23 AM    Weight  114.5 kg (252 lb 6.8 oz)           Gen: Elderly Caucasian male chronically ill in appearance HEENT: moist mucous membranes CV: Regular rate and rhythm  PULM: On 2L nasal cannula breathing is even and unlabored ABD: soft/nontender  EXT: No edema  Neuro: Awakens and is aware of person, place, situation. Fatigues quickly  SUMMARY OF RECOMMENDATIONS   DNAR/DNI  Patient and his family would like for him to continue rehab  Will readdress whether or not hospice will be pursued prior to discharge  The PMT will remain involved as needed ______________________________________________________________________________________ Henry [name] [facility] Palliative Medicine Team Team Cell Phone: [phone] Please utilize secure chat with additional questions, if there is no response within 30 minutes please call the above phone number  Billing based on MDM: Moderate   Palliative Medicine Team providers are available by phone from 7am to 7pm daily and can be reached through the team cell phone.  Should this patient require assistance outside of these hours, please call the patient's attending physician.

## 2023-12-21 NOTE — Progress Notes (Signed)
 Occupational Therapy Session Note  Patient Details  Name: Henry [name] MRN: [id number] Date of Birth: 07-29-1950  Today's Date: 12/21/2023 OT Individual Time: 1005-1100 & 1307-1350 OT Individual Time Calculation (min): 55 min & 43 min   Short Term Goals: Week 3:  OT Short Term Goal 1 (Week 3): (P) STG=LTG due to LOS  Skilled Therapeutic Interventions/Progress Updates:  Session 1 Skilled OT intervention completed with focus on functional activity tolerance, transfers and anterior weight shifting. Pt received upright in bed receiving meds from nurse. Pt appeared drowsy and remained slow to respond verbally but was engaged in meaningful conversation throughout. On 2 L O2 via [place] at 95-100% sat at rest, 79% with activity. PA present with recommendation to titrate pt as tolerated at rest. Lowered to 1L with seated activities and pt at 97%, with PA visiting pt requested, agreeable to session. No pain reported.  Transitioned EOB with min/mod A for BLE and trunk however effortful. CGA sit > stand and stand pivot with RW and flexed trunk > w/c. Transported dependently > gym. Worked on anterior weight shifting from seated position in prep for sit > stands at NISOURCE during bells cancellation assessment. Pt with proximal weakness of BUE but continued to be effortful. Fatigue with simple leaning.   Pt remained upright in w/c, with belt alarm on/activated, and with all needs in reach at end of session.  Session 2 Skilled OT intervention completed with focus on functional sit > stands, transfers and toileting. Pt received seated in w/c with family present. Pt asleep and difficult to arouse. Pt on RA, noted to be at 79% sat with minimal improvement with arousal, pursed lip breathing and nurse present for spirometer exercise. Placed pt on 0.5 to 1 L during session with activity as pt with fluctuating sat. Remained on RA at conclusion of session with O2 sat at 95% and nurse aware. Family present indicated pt's  eyes are red today and aren't normally and pt indicated a headache of unknown scale. Nurse made aware.  Rest breaks needed for slow processing, freezing episodes (baseline per family) and fatigue.  Completed x5 sit <> stands with CGA using RW. Cues needed for upright gaze and hand placement to promote trunk extension except limited due to baseline posture. Without therapist readiness pt did start to ambulate to bathroom stating I need to pee. Pt still connected to wall O2 and attempted to ambulate in unsafe manner but did respond to cues to sit for therapist readiness. Pt completed min A sit > stand and stand pivot with grab bar > BSC. Max A for toileting for continent urinary void. Pt did have freezing episode on stand pivot with RW back to w/c with assist needed for RW management for safety and w/c brought behind pt for fall prevention.  Pt remained in w/c, with belt alarm on/activated, and with all needs in reach at end of session.   Therapy Documentation Precautions:  Precautions Precautions: Fall Recall of Precautions/Restrictions: Impaired Precaution/Restrictions Comments: Parkinson's, deep brain stimulator, R ear deaf Restrictions Weight Bearing Restrictions Per Provider Order: No    Therapy/Group: Individual Therapy  Henry FORBES Fritter, MS, OTR/L  12/21/2023, 3:54 PM

## 2023-12-21 NOTE — Progress Notes (Signed)
 Patient reported to drop to high 70's with activity but recovers with rest. Seems more fatigued and increased hypoxia with activity. Reached out to pulmonary for input who recommended having effusions tapped by IR. MDI changed to Brovana and budesonide nebs. CXR repeat showing  right effusion to be worse. Thoracocentesis discussed with wife and daughter who are in agreement if this will be of benefit. Wife and daughter concerned that therapy is using his reserve and are concerned about how weak he seems today. They would prefer to discharge to home Monday with hospice v/s palliative care as appropriate. Reached to Palliative medicine and team--all on board for d/c on Monday.

## 2023-12-21 NOTE — Progress Notes (Signed)
 Physical Therapy Session Note  Patient Details  Name: Henry [name] MRN: [id number] Date of Birth: February 03, 1950  Today's Date: 12/21/2023 PT Individual Time: 0900-0940 PT Individual Time Calculation (min): 40 min   Short Term Goals: Week 1:  PT Short Term Goal 1 (Week 1): pt will transfer supine<>sitting EOB with mod A of 1 PT Short Term Goal 1 - Progress (Week 1): Progressing toward goal PT Short Term Goal 2 (Week 1): pt will transfer bed<>chair with LRAD and max A of 1 PT Short Term Goal 2 - Progress (Week 1): Met PT Short Term Goal 3 (Week 1): pt will transfer sit<>stand with max A of 1 PT Short Term Goal 3 - Progress (Week 1): Met Week 2:  PT Short Term Goal 1 (Week 2): pt will transfer supine<>sitting EOB with mod A of 1 PT Short Term Goal 2 (Week 2): pt will transfer sit<>stand with LRAD and min A consistantly PT Short Term Goal 3 (Week 2): pt will propel WC 82ft with CGA  Skilled Therapeutic Interventions/Progress Updates:   Received pt semi-reclined in bed requesting to get out of the chair - reoriented pt that he was in bed not chair. Pt on 2L O2 - titrated to RA and SPO2 90-92% throughout session. Pt agreeable to PT treatment and did not c/o pain during session. Session with emphasis on functional mobility/transfers, dressing, generalized strengthening and endurance, dynamic standing balance/coordination, and gait training. Pt's wife brought pt's personal walker from home. Pt transferred semi-reclined<>sitting R EOB with HOB elevated and max A for BLE management and trunk control with hand over hand cues for technique. Of note, pt much more rigid and slower to initiate this morning. Donned pants and shoes sitting EOB with max A and pt stood from EOB with u-step walker and CGA and required max A to pull pants over hips (unable to stand completley upright despite cues). Performed stand<>pivot into TIS WC with u-walker and mod A due to difficulty sequencing and pushing u-walker too far  forward.   Sat in WC at sink and washed face/brushed teeth with setup assist and ++ time. Stood with u-walker and CGA and ambulated 124ft with u-walker and min A - max cues for proximity to [employer]. Returned to room and concluded session with pt sitting in TIS WC, needs within reach, and seatbelt alarm on. SPO2 dropped to 85% at end of session therefore increased to 2L and SPO2 increased to 90%. Pt asleep before therapist left room.   Therapy Documentation Precautions:  Precautions Precautions: Fall Recall of Precautions/Restrictions: Impaired Precaution/Restrictions Comments: Parkinson's, deep brain stimulator, R ear deaf Restrictions Weight Bearing Restrictions Per Provider Order: No  Therapy/Group: Individual Therapy [name] [name] PT, DPT 12/21/2023, 6:30 AM

## 2023-12-21 NOTE — Progress Notes (Signed)
 Progress Note   Patient: Henry [name] FMW:[id number] DOB: August 09, 1950 DOA: 12/05/2023     16 DOS: the patient was seen and examined on 12/21/2023    Brief hospital course:  Henry [name] is a 73 y.o. male with medical history significant of hypertension, hyperlipidemia, diabetes, hypothyroidism, CAD status post CABG, GERD, gout, CKD 3, chronic systolic CHF, status post AICD, RLS, Parkinson disease, status post deep brain stimulator, neuropathy, asthma, OSA, obesity who presented to rehab after inpatient stay. Patient was admitted 11/18-11/26.   Initially came in with weakness for 2 to 3 weeks with episodes of facial asymmetry.  Symptoms appear to be worse after changing settings of his deep brain stimulator and neurology office.   Patient was worked up and evaluated by neurology while admitted.  CT head remained stable while admitted and unable to obtain MRI brain due to deep brain stimulator.  Patient was worked up for seizure and EEG was negative for any evidence of seizure.  It is thought that his presenting symptoms are likely due to accommodation of exacerbation of his Parkinson's disease, change in the brain stimulator settings, deconditioning.  PT recommended CIR upon discharge.   Patient has been at [facility] since 11/26.  Recently has developed some issues with shortness of breath, wheezing.     Chest x-ray 12/7: Mild pulmonary edema.  Patient also received albuterol  nebulizer.       Assessment and Plan:   Asthma exacerbation Patient presented with shortness of breath and wheezing High suspicion for asthma at this time however if symptoms persist could consider vocal cord dysfunction. Continue as needed nebulization Has been weaned off oxygen today - Schedule DuoNebs while awake overnight - While off steroid therapy - Check BNP to rule out CHF - Continue formulary Breo in place of home Wixela - As needed albuterol  - Respiratory panel negative   Chronic systolic CHF Last echo  was last year with EF  30%, G2 DD, mildly reduced RV function. Mild edema on chest x-ray.  - Continue with p.o. Lasix  - Continue spironolactone , losartan , metoprolol    Parkinson disease Status post DBS - Continue Sinemet    Hypertension - Has been continued on home Lasix , spironolactone , losartan , metoprolol    Hyperlipidemia - Remains on home Vascepa    Hypothyroidism - Continue with Synthroid    CAD Status post CABG - Continue home losartan , metoprolol , spironolactone , ASA   CKD 3 Creatinine stable - Continue to trend labs   Diabetes with hyperglycemia - Continue with insulin  per primary team   OSA  History of intolerance with CPAP due to panic attacks - Continue oxygen qHS   Obesity - Noted   TRH will continue to follow the patient.   Family Communication: None at time of evaluation   Thank you very much for involving us  in the care of your patient.     Subjective:  Continues to work with PT OT Denies any acute overnight event   Physical Exam Constitutional:      General: He is not in acute distress.    Appearance: Normal appearance.  HENT:     Head: Normocephalic and atraumatic.  Eyes:     Extraocular Movements: Extraocular movements intact.     Pupils: Pupils are equal, round, and reactive to light.  Cardiovascular:     Rate and Rhythm: Normal rate and regular rhythm.     Pulses: Normal pulses.     Heart sounds: Normal heart sounds.  Pulmonary: Air entry decreased bilaterally especially at the bases, no wheezing  Abdominal:     General: Bowel sounds are normal. There is no distension.     Palpations: Abdomen is soft.     Tenderness: There is no abdominal tenderness.  Musculoskeletal:        General: No swelling or deformity.  Skin:    General: Skin is warm and dry.  Neurological:     General: No focal deficit present.     Mental Status: Mental status is at baseline    Data Reviewed:      Latest Ref Rng & Units 12/19/2023    6:49 AM  12/17/2023    6:23 AM 12/13/2023    8:02 AM  CBC  WBC 4.0 - 10.5 K/uL 10.4  6.2  6.3   Hemoglobin 13.0 - 17.0 g/dL 85.7  86.8  86.5   Hematocrit 39.0 - 52.0 % 43.3  40.5  41.5   Platelets 150 - 400 K/uL 162  142  144        Latest Ref Rng & Units 12/21/2023    5:48 AM 12/20/2023   10:18 AM 12/19/2023    6:49 AM  BMP  Glucose 70 - 99 mg/dL 856  834  872   BUN 8 - 23 mg/dL 34  36  32   Creatinine 0.61 - 1.24 mg/dL 8.58  8.65  8.68   Sodium 135 - 145 mmol/L 141  137  139   Potassium 3.5 - 5.1 mmol/L 4.5  3.9  3.8   Chloride 98 - 111 mmol/L 95  95  93   CO2 22 - 32 mmol/L 39  36  34   Calcium  8.9 - 10.3 mg/dL 8.8  9.0  9.0       Vitals:   12/21/23 0500 12/21/23 0516 12/21/23 0849 12/21/23 0855  BP:  114/82    Pulse:  71    Resp:  17    Temp:  98.7 F (37.1 C)    TempSrc:      SpO2:  100% 93% 94%  Weight: 114.5 kg     Height:         Author: Drue ONEIDA Potter, MD 12/21/2023 1:42 PM  For on call review [email or web address].

## 2023-12-22 ENCOUNTER — Inpatient Hospital Stay (HOSPITAL_COMMUNITY)

## 2023-12-22 ENCOUNTER — Other Ambulatory Visit (HOSPITAL_COMMUNITY): Payer: Self-pay

## 2023-12-22 DIAGNOSIS — I10 Essential (primary) hypertension: Secondary | ICD-10-CM

## 2023-12-22 LAB — GLUCOSE, CAPILLARY
Glucose-Capillary: 134 mg/dL — ABNORMAL HIGH (ref 70–99)
Glucose-Capillary: 146 mg/dL — ABNORMAL HIGH (ref 70–99)
Glucose-Capillary: 205 mg/dL — ABNORMAL HIGH (ref 70–99)
Glucose-Capillary: 245 mg/dL — ABNORMAL HIGH (ref 70–99)

## 2023-12-22 LAB — BASIC METABOLIC PANEL WITH GFR
Anion gap: 6 (ref 5–15)
BUN: 30 mg/dL — ABNORMAL HIGH (ref 8–23)
CO2: 38 mmol/L — ABNORMAL HIGH (ref 22–32)
Calcium: 8.8 mg/dL — ABNORMAL LOW (ref 8.9–10.3)
Chloride: 97 mmol/L — ABNORMAL LOW (ref 98–111)
Creatinine, Ser: 1.27 mg/dL — ABNORMAL HIGH (ref 0.61–1.24)
GFR, Estimated: >60 mL/min (ref >60)
Glucose, Bld: 142 mg/dL — ABNORMAL HIGH (ref 70–99)
Potassium: 4.2 mmol/L (ref 3.5–5.1)
Sodium: 141 mmol/L (ref 135–145)

## 2023-12-22 LAB — CBC
HCT: 42.9 % (ref 39.0–52.0)
Hemoglobin: 13.4 g/dL (ref 13.0–17.0)
MCH: 28.8 pg (ref 26.0–34.0)
MCHC: 31.2 g/dL (ref 30.0–36.0)
MCV: 92.3 fL (ref 80.0–100.0)
Platelets: 152 K/uL (ref 150–400)
RBC: 4.65 MIL/uL (ref 4.22–5.81)
RDW: 14 % (ref 11.5–15.5)
WBC: 7.1 K/uL (ref 4.0–10.5)
nRBC: 0 % (ref 0.0–0.2)

## 2023-12-22 MED ORDER — BISACODYL 10 MG RE SUPP
10.0000 mg | Freq: Once | RECTAL | Status: AC
  Administered 2023-12-22: 10 mg via RECTAL
  Filled 2023-12-22: qty 1

## 2023-12-22 MED ORDER — LIDOCAINE HCL (PF) 1 % IJ SOLN: 10.0000 mL | Freq: Once | INTRAMUSCULAR | Status: DC

## 2023-12-22 MED ORDER — LIDOCAINE HCL 1 % IJ SOLN
INTRAMUSCULAR | Status: AC
  Filled 2023-12-22: qty 10

## 2023-12-22 NOTE — Procedures (Signed)
 PROCEDURE SUMMARY:  Unsuccessful image-guided right-sided thoracentesis. Unable to visualize lung cavity well enough on US  for safe access. Patient tolerated procedure well. EBL: Zero No immediate complications.  Post procedure CXR is pending.  Please see imaging section of Epic for full dictation.  Carlin LABOR Kelyse Pask PA-C 12/22/2023 3:02 PM

## 2023-12-22 NOTE — Progress Notes (Addendum)
 PROGRESS NOTE   Subjective/Complaints: No new complaints this AM. Reports wants to go home with wife soon.      12/22/2023    6:17 AM 12/22/2023    4:49 AM 12/21/2023    8:01 PM  Vitals with BMI  Weight 248 lbs 14 oz    BMI 36.74    Systolic  148   Diastolic  95   Pulse  71 66    Recent Labs    12/21/23 1650 12/21/23 2026 12/22/23 0633  GLUCAP 199* 203* 134*     ROS: +diffuse weakness, denies pain, HA, SOB  +urinary urgency, improving ambulation with therapy, cognition-  fluctuates on a daily basis  Objective:   DG Chest 2 View Result Date: 12/21/2023 EXAM: 2 VIEW(S) XRAY OF THE CHEST 12/21/2023 04:23:00 PM COMPARISON: 12/17/2023 CLINICAL HISTORY: Hypoxia. FINDINGS: LINES, TUBES AND DEVICES: Right neck stimulator. LUNGS AND PLEURA: Low lung volumes with vascular crowding. No pleural effusion. No pneumothorax. HEART AND MEDIASTINUM: Left chest AICD. Postsurgical changes from CABG. Cardiomegaly. BONES AND SOFT TISSUES: Median sternotomy. No acute osseous abnormality. IMPRESSION: 1. Cardiomegaly with low lung volumes. Electronically signed by: Pinkie Pebbles MD 12/21/2023 09:22 PM EST RP Workstation: [id number]     Recent Labs    12/22/23 0553  WBC 7.1  HGB 13.4  HCT 42.9  PLT 152   Recent Labs    12/21/23 0548 12/22/23 0553  NA 141 141  K 4.5 4.2  CL 95* 97*  CO2 39* 38*  GLUCOSE 143* 142*  BUN 34* 30*  CREATININE 1.41* 1.27*  CALCIUM  8.8* 8.8*    Intake/Output Summary (Last 24 hours) at 12/22/2023 1044 Last data filed at 12/22/2023 0452 Gross per 24 hour  Intake 118 ml  Output 600 ml  Net -482 ml         Physical Exam: Vital Signs Blood pressure (!) 148/95, pulse 71, temperature 97.9 F (36.6 C), temperature source Oral, resp. rate 16, height 5' 9 (1.753 m), weight 112.9 kg, SpO2 91%. Constitutional: No distress . Vital signs reviewed. Obese. Sitting in WC, appears comfortable   HEENT: NCAT, EOMI, oral membranes moist Neck: supple Cardiovascular: Bradycardia Respiratory/Chest: wheezing more today GI/Abdomen: BS +, non-tender, non-distended Ext: no clubbing, cyanosis, or edema Psych: flat but cooperative  Skin: Bruising noted on bilateral upper extremities--stable Neuro:     Mental Status: AAOx to person and place; partially to time (month as November)  + memory deficits present, intermittant confusion, delayed responses,   Speech/Languate:  severe dysarthria  follows simple commands. Pill rolling tremor bilaterally CRANIAL NERVES: II: PERRL. Visual fields full III, IV, VI: EOM intact, no gaze preference or deviation V: normal sensation bilaterally VII: no asymmetry VIII: normal hearing to speech IX, X: normal palatal elevation XI: head turn intact b/l XII: Tongue midline     MOTOR: 4-/5 strength throughout, stable 12/12 SENSORY: Normal to touch all 4 extremities   persistent rigidity in b/l legs Right greater than left--ongoing   Prior neuro assessment is c/w today's exam 12/22/2023.    Assessment/Plan: 1. Functional deficits which require 3+ hours per day of interdisciplinary therapy in a comprehensive inpatient rehab setting. Physiatrist is providing close team supervision  and 24 hour management of active medical problems listed below. Physiatrist and rehab team continue to assess barriers to discharge/monitor patient progress toward functional and medical goals  Care Tool:  Bathing    Body parts bathed by patient: Face, Right arm, Left arm, Chest   Body parts bathed by helper: Abdomen, Front perineal area, Buttocks, Right upper leg, Left upper leg, Right lower leg, Left lower leg     Bathing assist Assist Level: Maximal Assistance - Patient 24 - 49%     Upper Body Dressing/Undressing Upper body dressing   What is the patient wearing?: Pull over shirt    Upper body assist Assist Level: Moderate Assistance - Patient 50 - 74%     Lower Body Dressing/Undressing Lower body dressing      What is the patient wearing?: Pants, Incontinence brief     Lower body assist Assist for lower body dressing: Total Assistance - Patient < 25%     Toileting Toileting    Toileting assist Assist for toileting: Total Assistance - Patient < 25%     Transfers Chair/bed transfer  Transfers assist     Chair/bed transfer assist level: Contact Guard/Touching assist     Locomotion Ambulation   Ambulation assist   Ambulation activity did not occur: Safety/medical concerns  Assist level: Contact Guard/Touching assist Assistive device: Other (comment) (u step walker) Max distance: 151ft   Walk 10 feet activity   Assist  Walk 10 feet activity did not occur: Safety/medical concerns  Assist level: Contact Guard/Touching assist Assistive device: Other (comment) (u step walker)   Walk 50 feet activity   Assist Walk 50 feet with 2 turns activity did not occur: Safety/medical concerns  Assist level: Contact Guard/Touching assist      Walk 150 feet activity   Assist Walk 150 feet activity did not occur: Safety/medical concerns  Assist level: Minimal Assistance - Patient > 75% Assistive device: Walker-rolling    Walk 10 feet on uneven surface  activity   Assist Walk 10 feet on uneven surfaces activity did not occur: Safety/medical concerns         Wheelchair     Assist Is the patient using a wheelchair?: Yes Type of Wheelchair: Manual    Wheelchair assist level: Dependent - Patient 0% Max wheelchair distance: >347ft    Wheelchair 50 feet with 2 turns activity    Assist        Assist Level: Dependent - Patient 0%   Wheelchair 150 feet activity     Assist      Assist Level: Dependent - Patient 0%   Blood pressure (!) 148/95, pulse 71, temperature 97.9 F (36.6 C), temperature source Oral, resp. rate 16, height 5' 9 (1.753 m), weight 112.9 kg, SpO2 91%.  Medical Problem  List and Plan: 1. Functional deficits secondary to generalized weakness due to parkinson disease. Pt has DBS.              -patient may shower             -ELOS/Goals: 14-16, PT sup to CGA, OT mon/mod A, SLP sup to min A             11/30 -pt with worsening dysarthria and weakness this past week. Both HCT and EEG reviewed. No acute changes on CT and EEG demonstrates diffuse slowing. Neurology did not recommend any changes yesterday.    - I don't see any infectious, pharmaceutical, or metabolic reasons for presentation. Labs were ok yesterday   -  likely progression of PD unfortunately   -continue to monitor, encourage OOB activity   -perhaps follow up with neurology this week Discussed with patient's family his progress, discussed with palliative care  - Plan for DC Monday with hospice vs palliative care   2.  Antithrombotics: -DVT/anticoagulation:  Pharmaceutical: continue Lovenox              -antiplatelet therapy: ASA  3. Episodes of blank stares: EEG ordered and is consistent with encephalopathy- no seizures evident  4. Mood/Behavior/Sleep: LCSW to follow for evaluation and support.              -antipsychotic agents: N/A  5. Neuropsych/cognition: This patient may be intermittent  capable of making decisions on his own behalf.  6. Skin/Wound Care: Routine pressure relief measures  7. Class 2 obesity: provided wife with a list of foods for weight loss, continue magnesium  supplement, d/c feeding supplement  8. PD s/p DBS/Autonomic dysfunction: Monitor for orthostatic symptoms with increase in activity             -continue sinemet  ER five times a day.  --continue Pimavanserin  daily to  manage hallucinations  continue metanx D/c amantadine  as per wife's request Deep brain stimulator was turned back on as per wife  9. T2DM: Monitor BS ac/hs, d/c ISS to help get patient on home regimen, provided a list of foods that are good for diabetes             Increase Lantus  to 16U, Started  metformin  500mg  daily with breakfast, d/c aspart  CBG (last 3)  Recent Labs    12/21/23 1650 12/21/23 2026 12/22/23 0633  GLUCAP 199* 203* 134*    Decrease CBGs to [facility]  Prednisone  decreased to 20mg  daily  -12/13 CBG fair control, continue to monitor 10. HFrEF: Strict I/O. Daily weights and monitor for signs of overload.              --Low salt diet. Continue ASA, Lasix , Toprol  XL and aldactone .    - asked nursing to weigh him  -12/13 wt appear stable, monitor  Filed Weights   12/20/23 0300 12/21/23 0500 12/22/23 0617  Weight: 112.3 kg 114.5 kg 112.9 kg    11. Glaucoma: Continue cosopt   12. Hyperactive airway disease: continue Breo. Duonebs started for asthma. Discussed with nursing goal sats 88-92%, continue 20mg  prednisone  daily  13. Thrush: d/c nystatin  since has completed 14 days  14. AKI: repeat BMP tomorrow, increase daily lasix  back to 40mg  daily  -12/13 BUN and CR a little improve today, monitor  15.  Thrombocytopenia: PC reviewed and is stable  -12/13 PLT 152, stable   16. Urinary retention: d/c mirabegron . Will monitor voiding with PVR checks. D/c flomax  given dizziness  17. Constipation: d/c miralax , asked nursing to please offer prune juice, increase magnesium  oxide to 400mg  daily, magnesium  level reviewed 12/11 and is    -Sorbitol  ordered 12/11 with no results, mirlax ordered 12/12, asked nursing to try suppository if this is ineffective today  -12/13 order suppository  18. Concern for seizures: monitor for signs/symptoms, EEG ordered and shows no evidence of seizures  ` - see #3  19. Spasticity/dystonia: patient follows with Dr. Carilyn for botox  outpatient  20. Mild vascular congestion and pulmonary edema with mild atelectasis on CXR: encouraged use of incentive spirometer. Discussed with patient/wife/therapy weaning O2 with therapy, incentive spirometer ordered, increase lasix  back to 40mg  daily since weight has increased  -12/13 order yesterday for  thoracentesis for effusion, will check  with IR if this may be dose today- discussed, recommended U/S chest to check for effusion- ordered  21. Dizziness: flomax  d/ced, BP reviewed and is stable, losartan  d/ced, weight is decreased significantly, decrease lasix  to 30mg  due to worsening creatinine  22. Concern for UTI from family: UA reviewed and is negative  LOS: 17 days A FACE TO FACE EVALUATION WAS PERFORMED  Murray Collier 12/22/2023, 10:44 AM

## 2023-12-22 NOTE — Progress Notes (Signed)
 Physical Therapy Discharge Summary  Patient Details  Name: Henry [name] MRN: [id number] Date of Birth: 06/18/1950  Date of Discharge from PT service:December 23, 2023  Patient has met 4 of 10 long term goals due to improved activity tolerance, improved balance, improved postural control, and ability to compensate for deficits. Patient to discharge at a wheelchair level CGA/min A. Patient's care partner is independent to provide the necessary physical and cognitive assistance at discharge. Pt's family has all necessary DME for discharge.   Reasons goals not met: Pt/family decided to move up discharge to Monday with palliative v/s hospice care services. Goals not met due to fluctuating mobility due to Parkinson's, fatigue, weakness/deconditioning, and SOB with activity.    Recommendation:  Patient will benefit from ongoing skilled PT services in home health setting to continue to advance safe functional mobility, address ongoing impairments in transfers, generalized strengthening and endurance, dynamic standing balance/coordination, NMR, gait training, and to minimize fall risk.  Equipment: No equipment provided  Reasons for discharge: discharge from hospital  Patient/family agrees with progress made and goals achieved: Yes  PT Discharge Precautions/Restrictions Precautions Precautions: Fall Precaution/Restrictions Comments: Parkinson's, deep brain stimulator, R ear deaf Restrictions Weight Bearing Restrictions Per Provider Order: No Pain Interference Pain Interference Pain Effect on Sleep: 1. Rarely or not at all Pain Interference with Therapy Activities: 1. Rarely or not at all Pain Interference with Day-to-Day Activities: 1. Rarely or not at all Cognition Overall Cognitive Status: Impaired/Different from baseline Arousal/Alertness: Awake/alert Orientation Level: Oriented X4 Memory: Impaired Awareness: Impaired Problem Solving: Impaired Safety/Judgment:  Impaired Sensation Sensation Light Touch: Impaired Detail Light Touch Impaired Details: Impaired RLE Hot/Cold: Not tested Proprioception: Impaired by gross assessment Stereognosis: Not tested Additional Comments: pt reports numbness in R foot Coordination Gross Motor Movements are Fluid and Coordinated: No Coordination and Movement Description: fluctuates - impaired awareness, initiation, and motor planning/sequencing due to Parkinson's Motor  Motor Motor: Abnormal tone;Abnormal postural alignment and control Motor - Skilled Clinical Observations: fluctuates - impaired awareness, initiation, and motor planning/sequencing due to Parkinson's  Mobility Bed Mobility Bed Mobility: Rolling Right;Rolling Left;Sit to Supine;Supine to Sit Rolling Right: Moderate Assistance - Patient 50-74% Rolling Left: Moderate Assistance - Patient 50-74% Supine to Sit: Moderate Assistance - Patient 50-74% Sit to Supine: Maximal Assistance - Patient 25-49% Transfers Transfers: Sit to Stand;Stand to Sit;Stand Pivot Transfers Sit to Stand: Contact Guard/Touching assist Stand to Sit: Contact Guard/Touching assist Stand Pivot Transfers: Minimal Assistance - Patient > 75% Stand Pivot Transfer Details: Tactile cues for weight shifting;Verbal cues for sequencing;Verbal cues for precautions/safety Stand Pivot Transfer Details (indicate cue type and reason): verbal and tactile cues for sequencing with turning Transfer (Assistive device): Other (Comment) (u step walker) Locomotion  Gait Ambulation: Yes Gait Assistance: Contact Guard/Touching assist Gait Distance (Feet): 55 Feet Assistive device: Other (Comment) (u step walker) Gait Gait: Yes Gait Pattern: Impaired Gait Pattern: Step-to pattern;Decreased step length - right;Decreased step length - left;Decreased stride length;Decreased dorsiflexion - right;Decreased dorsiflexion - left;Poor foot clearance - right;Poor foot clearance - left;Narrow base of  support;Decreased trunk rotation;Trunk flexed;Festinating Gait velocity: reduced Stairs / Additional Locomotion Stairs: No Wheelchair Mobility Wheelchair Mobility: Yes (TIS WC) Wheelchair Assistance: Dependent - Patient 0% Wheelchair Parts Management: Needs assistance Distance: >326ft  Trunk/Postural Assessment  Cervical Assessment Cervical Assessment: Exceptions to [facility] (forward head) Thoracic Assessment Thoracic Assessment: Exceptions to [facility] (thoracic rounding) Lumbar Assessment Lumbar Assessment: Exceptions to [facility] (posterior pelvic tilt) Postural Control Postural Control: Deficits on evaluation Trunk Control: posterior bias in  sitting and standing Righting Reactions: delayed and inadequate Protective Responses: delayed and inadequate  Balance Balance Balance Assessed: Yes Static Sitting Balance Static Sitting - Balance Support: Feet supported;Bilateral upper extremity supported Static Sitting - Level of Assistance: 5: Stand by assistance (distant supervision) Dynamic Sitting Balance Dynamic Sitting - Balance Support: Feet supported;No upper extremity supported Dynamic Sitting - Level of Assistance: 5: Stand by assistance (supervision) Static Standing Balance Static Standing - Balance Support: Bilateral upper extremity supported;During functional activity (u step walker) Static Standing - Level of Assistance: 5: Stand by assistance (CGA) Dynamic Standing Balance Dynamic Standing - Balance Support: Bilateral upper extremity supported;During functional activity (u step walker) Dynamic Standing - Level of Assistance: 5: Stand by assistance (CGA) Dynamic Standing - Comments: with sit<>stands and walking in straight line; min A when turns are involved Extremity Assessment  RLE Assessment RLE Assessment: Exceptions to [facility] General Strength Comments: tested sitting in TIS WC; grossly 3+/5 LLE Assessment LLE Assessment: Exceptions to [facility] General Strength Comments: tested sitting in  TIS WC; grossly 3+/5   [name] [name] PT, DTaP Doreene Orris, PT, DPT  12/22/2023, 7:09 AM

## 2023-12-22 NOTE — Progress Notes (Signed)
 Physical Therapy Session Note  Patient Details  Name: KHANI PAINO MRN: [id number] Date of Birth: Dec 23, 1950  Today's Date: 12/22/2023 PT Individual Time: 0830-0910 PT Individual Time Calculation (min): 40 min   Short Term Goals: Week 2:  PT Short Term Goal 1 (Week 2): pt will transfer supine<>sitting EOB with mod A of 1 PT Short Term Goal 1 - Progress (Week 2): Met PT Short Term Goal 2 (Week 2): pt will transfer sit<>stand with LRAD and min A consistantly PT Short Term Goal 2 - Progress (Week 2): Progressing toward goal PT Short Term Goal 3 (Week 2): pt will propel WC 85ft with CGA PT Short Term Goal 3 - Progress (Week 2): Progressing toward goal Week 3:  PT Short Term Goal 1 (Week 3): STG = LTG due to LOS  Skilled Therapeutic Interventions/Progress Updates:   Received pt semi-reclined in bed with RN administering medication. Pt on 1L with SPO2 94% - titrated to RA and SPO2 remained >92% with activity but pt breathing heavily. Pt agreeable to PT treatment and did not c/o pain during session. Session with emphasis on discharge planning, functional mobility/transfers, dressing, generalized strengthening and endurance, dynamic standing balance/coordination, and ambulation. Pt transferred semi-reclined<>sitting R EOB with HOB elevated and use of bedrails with mod A for BLE management and trunk control with hand over hand cues for sequencing. Removed dirty shirt and donned clean one with max A and donned pants and shoes sitting EOB with max A. Pt performed all sit<>stands with u walker and CGA throughout session. Pt reported brief was soiled but when checked, found to be clean. Performed hygiene management dependently anyway and donned clean brief with max A. Pt required max A to pull pants over hips and performed stand<>pivot into TIS WC with u walker and min A - cues and assist to pivot hips all the way around as pt attempting to sit too early. Went through sensation, MMT, and pain interference  questionnaire in preparation for discharge. Pt then ambulated 34ft with u walker and CGA (pt demo improvements in remembering to remain close to walker) - limited by SOB but SPO2 91%. Returned to room and pt reported fatigue and unable to keep eyes open - SPO2 dropped to 85%, therefore increased to 1L and SPO2 increased to 91%. Concluded session with pt sitting in TIS WC, needs within reach, and seatbelt alarm on.   Therapy Documentation Precautions:  Precautions Precautions: Fall Recall of Precautions/Restrictions: Impaired Precaution/Restrictions Comments: Parkinson's, deep brain stimulator, R ear deaf Restrictions Weight Bearing Restrictions Per Provider Order: No  Therapy/Group: Individual Therapy [name] [name] PT, DPT 12/22/2023, 6:54 AM

## 2023-12-22 NOTE — Progress Notes (Signed)
 Progress Note   Patient: Henry [name] FMW:[id number] DOB: 1950/06/29 DOA: 12/05/2023     17 DOS: the patient was seen and examined on 12/22/2023     Brief hospital course:  Henry [name] is a 73 y.o. male with medical history significant of hypertension, hyperlipidemia, diabetes, hypothyroidism, CAD status post CABG, GERD, gout, CKD 3, chronic systolic CHF, status post AICD, RLS, Parkinson disease, status post deep brain stimulator, neuropathy, asthma, OSA, obesity who presented to rehab after inpatient stay. Patient was admitted 11/18-11/26.   Initially came in with weakness for 2 to 3 weeks with episodes of facial asymmetry.  Symptoms appear to be worse after changing settings of his deep brain stimulator and neurology office.   Patient was worked up and evaluated by neurology while admitted.  CT head remained stable while admitted and unable to obtain MRI brain due to deep brain stimulator.  Patient was worked up for seizure and EEG was negative for any evidence of seizure.  It is thought that his presenting symptoms are likely due to accommodation of exacerbation of his Parkinson's disease, change in the brain stimulator settings, deconditioning.  PT recommended CIR upon discharge.   Patient has been at [facility] since 11/26.  Recently has developed some issues with shortness of breath, wheezing.     Chest x-ray 12/7: Mild pulmonary edema.  Patient also received albuterol  nebulizer.       Assessment and Plan:   Asthma exacerbation Patient presented with shortness of breath and wheezing High suspicion for asthma at this time however if symptoms persist could consider vocal cord dysfunction. Continue as needed nebulization Requiring 2L oxygen - Schedule DuoNebs while awake overnight - Wean off steroid therapy - Check BNP to rule out CHF - Continue formulary Breo in place of home Wixela - As needed albuterol  - Respiratory panel negative   Chronic systolic CHF Last echo was last  year with EF  30%, G2 DD, mildly reduced RV function. Mild edema on chest x-ray.  - Continue with p.o. Lasix  - Continue spironolactone , losartan , metoprolol    Parkinson disease Status post DBS - Continue Sinemet    Hypertension - Has been continued on home Lasix , spironolactone , losartan , metoprolol    Hyperlipidemia - Remains on home Vascepa    Hypothyroidism - Continue with Synthroid    CAD Status post CABG - Continue home losartan , metoprolol , spironolactone , ASA   CKD 3 Creatinine stable - Continue to trend labs   Diabetes with hyperglycemia - Continue with insulin  per primary team   OSA  History of intolerance with CPAP due to panic attacks - Continue oxygen qHS   Obesity - Noted   TRH will continue to follow the patient.   Family Communication: None at time of evaluation   Thank you very much for involving us  in the care of your patient.     Subjective:  Continues to work with PT OT Denies acute overnight events   Physical Exam Constitutional:      General: He is not in acute distress.    Appearance: Normal appearance.  HENT:     Head: Normocephalic and atraumatic.  Eyes:     Extraocular Movements: Extraocular movements intact.     Pupils: Pupils are equal, round, and reactive to light.  Cardiovascular:     Rate and Rhythm: Normal rate and regular rhythm.     Pulses: Normal pulses.     Heart sounds: Normal heart sounds.  Pulmonary: Air entry decreased bilaterally especially at the bases, no wheezing Abdominal:  General: Bowel sounds are normal. There is no distension.     Palpations: Abdomen is soft.     Tenderness: There is no abdominal tenderness.  Musculoskeletal:        General: No swelling or deformity.  Skin:    General: Skin is warm and dry.  Neurological:     General: No focal deficit present.     Mental Status: Mental status is at baseline    Data Reviewed:    Vitals:   12/21/23 2001 12/22/23 0449 12/22/23 0617 12/22/23 1325   BP:  (!) 148/95  126/72  Pulse: 66 71  62  Resp: 18 16  (!) 24  Temp:  97.9 F (36.6 C)  98.3 F (36.8 C)  TempSrc:  Oral    SpO2: 96% 91%  95%  Weight:   112.9 kg   Height:          Latest Ref Rng & Units 12/22/2023    5:53 AM 12/19/2023    6:49 AM 12/17/2023    6:23 AM  CBC  WBC 4.0 - 10.5 K/uL 7.1  10.4  6.2   Hemoglobin 13.0 - 17.0 g/dL 86.5  85.7  86.8   Hematocrit 39.0 - 52.0 % 42.9  43.3  40.5   Platelets 150 - 400 K/uL 152  162  142        Latest Ref Rng & Units 12/22/2023    5:53 AM 12/21/2023    5:48 AM 12/20/2023   10:18 AM  BMP  Glucose 70 - 99 mg/dL 857  856  834   BUN 8 - 23 mg/dL 30  34  36   Creatinine 0.61 - 1.24 mg/dL 8.72  8.58  8.65   Sodium 135 - 145 mmol/L 141  141  137   Potassium 3.5 - 5.1 mmol/L 4.2  4.5  3.9   Chloride 98 - 111 mmol/L 97  95  95   CO2 22 - 32 mmol/L 38  39  36   Calcium  8.9 - 10.3 mg/dL 8.8  8.8  9.0      Author: Drue ONEIDA Potter, MD 12/22/2023 2:47 PM  For on call review [email or web address].

## 2023-12-23 DIAGNOSIS — J90 Pleural effusion, not elsewhere classified: Secondary | ICD-10-CM

## 2023-12-23 LAB — CBC
HCT: 43 % (ref 39.0–52.0)
Hemoglobin: 13.5 g/dL (ref 13.0–17.0)
MCH: 28.9 pg (ref 26.0–34.0)
MCHC: 31.4 g/dL (ref 30.0–36.0)
MCV: 92.1 fL (ref 80.0–100.0)
Platelets: 132 K/uL — ABNORMAL LOW (ref 150–400)
RBC: 4.67 MIL/uL (ref 4.22–5.81)
RDW: 14 % (ref 11.5–15.5)
WBC: 7.4 K/uL (ref 4.0–10.5)
nRBC: 0 % (ref 0.0–0.2)

## 2023-12-23 LAB — BASIC METABOLIC PANEL WITH GFR
Anion gap: 7 (ref 5–15)
BUN: 27 mg/dL — ABNORMAL HIGH (ref 8–23)
CO2: 40 mmol/L — ABNORMAL HIGH (ref 22–32)
Calcium: 8.6 mg/dL — ABNORMAL LOW (ref 8.9–10.3)
Chloride: 92 mmol/L — ABNORMAL LOW (ref 98–111)
Creatinine, Ser: 1.18 mg/dL (ref 0.61–1.24)
GFR, Estimated: >60 mL/min (ref >60)
Glucose, Bld: 124 mg/dL — ABNORMAL HIGH (ref 70–99)
Potassium: 3.7 mmol/L (ref 3.5–5.1)
Sodium: 139 mmol/L (ref 135–145)

## 2023-12-23 LAB — GLUCOSE, CAPILLARY
Glucose-Capillary: 110 mg/dL — ABNORMAL HIGH (ref 70–99)
Glucose-Capillary: 193 mg/dL — ABNORMAL HIGH (ref 70–99)
Glucose-Capillary: 194 mg/dL — ABNORMAL HIGH (ref 70–99)
Glucose-Capillary: 199 mg/dL — ABNORMAL HIGH (ref 70–99)

## 2023-12-23 NOTE — Progress Notes (Signed)
 Speech Language Pathology Daily Session Note  Patient Details  Name: Henry [name] MRN: [id number] Date of Birth: 1950-07-14  Today's Date: 12/23/2023 SLP Individual Time: 1300-1345 SLP Individual Time Calculation (min): 45 min  Short Term Goals: Week 2: SLP Short Term Goal 1 (Week 2): Pt will utilize compensatory speech strategies to maintain 80% intelligibility or greater at phrase level given modA SLP Short Term Goal 2 (Week 2): Patient will complete SPEAK OUT! vocal intensity exercises with mod assist and use of visual aid. SLP Short Term Goal 3 (Week 2): Pt will solve simple problems w/ minA SLP Short Term Goal 4 (Week 2): Pt will sustain attention for ~15 mins w/ modA SLP Short Term Goal 5 (Week 2): Pt will tolerate regular textures w/ thin liquids w/ no overt s/s of airway invasion 95% of the time or greater SLP Short Term Goal 6 (Week 2): Pt will recall recent/relevant info w/ modA  Skilled Therapeutic Interventions:   Pt and family greeted at bedside for tx targeting cognition and communication. He was completing his lunch upon SLP arrival. Pt presented w/ cough x1 - otherwise WFL. He continues to present w/ reduced success as compared to the beginning of this week, as he required frequent rest breaks and appeared SOB at times. O2 sats remained WFL on 2L via nasal cannula. He completed a verbal sorting task w/ only supervisionA for working memory and minA to maintain 85% intelligibility w/ context. He was able to sustain attention for ~10 mins at a time given modA. At the end of tx tasks, he was left reclined in his [facility] w/ the alarm set and call light within reach. Recommend cont ST per POC.   Pain Pain Assessment Pain Scale: 0-10 Pain Score: 0-No pain  Therapy/Group: Individual Therapy  [name] 12/23/2023, 1:41 PM

## 2023-12-23 NOTE — Progress Notes (Addendum)
 PROGRESS NOTE   Subjective/Complaints: IR unable to complete thoracentesis, unable to visualize lung cavity well enough.  Patient reports pain is under control.  Repeat x-ray does not show definitive pleural effusion per radiology.     12/23/2023    8:57 AM 12/23/2023    6:29 AM 12/23/2023    4:27 AM  Vitals with BMI  Weight  248 lbs 11 oz   BMI  36.71   Systolic   143  Diastolic   70  Pulse 63  60    Recent Labs    12/22/23 1634 12/22/23 2033 12/23/23 0552  GLUCAP 205* 245* 110*     ROS: +diffuse weakness, denies pain, HA, SOB  +urinary urgency, improving ambulation with therapy, cognition-  fluctuates on a daily basis  Objective:   US  THORACENTESIS ASP PLEURAL SPACE W/IMG GUIDE Result Date: 12/22/2023 INDICATION: 72 year old male with hypoxia, bilateral pleural effusions. IR was requested for diagnostic and therapeutic thoracentesis EXAM: ULTRASOUND GUIDED DIAGNOSTIC AND THERAPEUTIC THORACENTESIS MEDICATIONS: 7 mL of 1% lidocaine  COMPLICATIONS: None immediate. PROCEDURE: An ultrasound guided thoracentesis was thoroughly discussed with the patient's wife, and questions answered. The benefits, risks, alternatives and complications were also discussed. The patient's wife understands and wishes to proceed with the procedure. Written consent was obtained. Ultrasound was performed to localize and mark an adequate pocket of fluid in the right chest. Ultrasound visualization of thorax is limited by patient's body habitus. The area was then prepped and draped in the normal sterile fashion. 1% Lidocaine  was used for local anesthesia. Unfortunately, poor visualization under ultrasound prevented safe advance of safety-T needle into right thorax, and thoracentesis attempt was ultimately aborted. The catheter was removed and a dressing applied. FINDINGS: Unsuccessful attempt at right thoracentesis. IMPRESSION: Unsuccessful ultrasound  guided right thoracentesis attempt. Procedure performed by: Carlin Griffon, PA-C Electronically Signed   By: Ester Sides M.D.   On: 12/22/2023 20:22   DG Chest Port 1 View Result Date: 12/22/2023 CLINICAL DATA:  [id number] S/P thoracentesis [id number] EXAM: PORTABLE CHEST 1 VIEW COMPARISON:  December 21, 2023 FINDINGS: The cardiomediastinal silhouette is unchanged and enlarged in contour.Unchanged elevation of the RIGHT hemidiaphragm. LEFT chest AICD. Status post median sternotomy. Nerve stimulator device. No definitive pleural effusion. No definitive pneumothorax. No acute pleuroparenchymal abnormality. IMPRESSION: No definitive pneumothorax. Electronically Signed   By: Corean Salter M.D.   On: 12/22/2023 18:03   DG Chest 2 View Result Date: 12/21/2023 EXAM: 2 VIEW(S) XRAY OF THE CHEST 12/21/2023 04:23:00 PM COMPARISON: 12/17/2023 CLINICAL HISTORY: Hypoxia. FINDINGS: LINES, TUBES AND DEVICES: Right neck stimulator. LUNGS AND PLEURA: Low lung volumes with vascular crowding. No pleural effusion. No pneumothorax. HEART AND MEDIASTINUM: Left chest AICD. Postsurgical changes from CABG. Cardiomegaly. BONES AND SOFT TISSUES: Median sternotomy. No acute osseous abnormality. IMPRESSION: 1. Cardiomegaly with low lung volumes. Electronically signed by: Pinkie Pebbles MD 12/21/2023 09:22 PM EST RP Workstation: [id number]     Recent Labs    12/22/23 0553 12/23/23 0652  WBC 7.1 7.4  HGB 13.4 13.5  HCT 42.9 43.0  PLT 152 132*   Recent Labs    12/22/23 0553 12/23/23 0652  NA 141 139  K 4.2 3.7  CL 97* 92*  CO2 38* 40*  GLUCOSE 142* 124*  BUN 30* 27*  CREATININE 1.27* 1.18  CALCIUM  8.8* 8.6*    Intake/Output Summary (Last 24 hours) at 12/23/2023 1106 Last data filed at 12/23/2023 0918 Gross per 24 hour  Intake 763 ml  Output 1800 ml  Net -1037 ml         Physical Exam: Vital Signs Blood pressure (!) 143/70, pulse 63, temperature 97.7 F (36.5 C), temperature source Oral, resp. rate  16, height 5' 9 (1.753 m), weight 112.8 kg, SpO2 99%. Constitutional: No distress . Vital signs reviewed. Obese. Sitting in WC, appears comfortable  HEENT: NCAT, EOMI, oral membranes moist Neck: supple Cardiovascular: RRR Respiratory/Chest: Decreased breath sounds at bases, nonlabored breathing, on 2 L nasal cannula GI/Abdomen: BS +, non-tender, non-distended Ext: no clubbing, cyanosis, or edema Psych: flat but cooperative  Skin: Bruising noted on bilateral upper extremities--stable Neuro: Alert and awake, follows simple commands, moving all 4 extremities to gravity, sensation intact to light touch in all 4 extremities, delayed responses    Prior exam: Mental Status: AAOx to person and place; partially to time (month as November)  + memory deficits present, intermittant confusion, delayed responses,   Speech/Languate:  severe dysarthria  follows simple commands. Pill rolling tremor bilaterally CRANIAL NERVES: II: PERRL. Visual fields full III, IV, VI: EOM intact, no gaze preference or deviation V: normal sensation bilaterally VII: no asymmetry VIII: normal hearing to speech IX, X: normal palatal elevation XI: head turn intact b/l XII: Tongue midline     MOTOR: 4-/5 strength throughout, stable 12/12 SENSORY: Normal to touch all 4 extremities   persistent rigidity in b/l legs Right greater than left--ongoing    Assessment/Plan: 1. Functional deficits which require 3+ hours per day of interdisciplinary therapy in a comprehensive inpatient rehab setting. Physiatrist is providing close team supervision and 24 hour management of active medical problems listed below. Physiatrist and rehab team continue to assess barriers to discharge/monitor patient progress toward functional and medical goals  Care Tool:  Bathing    Body parts bathed by patient: Face, Right arm, Left arm, Chest   Body parts bathed by helper: Abdomen, Front perineal area, Buttocks, Right upper leg, Left upper  leg, Right lower leg, Left lower leg     Bathing assist Assist Level: Maximal Assistance - Patient 24 - 49%     Upper Body Dressing/Undressing Upper body dressing   What is the patient wearing?: Pull over shirt    Upper body assist Assist Level: Moderate Assistance - Patient 50 - 74%    Lower Body Dressing/Undressing Lower body dressing      What is the patient wearing?: Pants, Incontinence brief     Lower body assist Assist for lower body dressing: Total Assistance - Patient < 25%     Toileting Toileting    Toileting assist Assist for toileting: Total Assistance - Patient < 25%     Transfers Chair/bed transfer  Transfers assist     Chair/bed transfer assist level: Minimal Assistance - Patient > 75%     Locomotion Ambulation   Ambulation assist   Ambulation activity did not occur: Safety/medical concerns  Assist level: Contact Guard/Touching assist Assistive device: Other (comment) (u step RW) Max distance: 35ft   Walk 10 feet activity   Assist  Walk 10 feet activity did not occur: Safety/medical concerns  Assist level: Contact Guard/Touching assist Assistive device: Other (comment) (u step walker)   Walk 50 feet activity   Assist Walk 50 feet with  2 turns activity did not occur: Safety/medical concerns  Assist level: Contact Guard/Touching assist      Walk 150 feet activity   Assist Walk 150 feet activity did not occur: Safety/medical concerns  Assist level: Minimal Assistance - Patient > 75% Assistive device: Walker-rolling    Walk 10 feet on uneven surface  activity   Assist Walk 10 feet on uneven surfaces activity did not occur: Safety/medical concerns (fatigue, decreased balance, poor sequencing)         Wheelchair     Assist Is the patient using a wheelchair?: Yes Type of Wheelchair:  (TIS WC)    Wheelchair assist level: Dependent - Patient 0% Max wheelchair distance: >315ft    Wheelchair 50 feet with 2 turns  activity    Assist        Assist Level: Dependent - Patient 0%   Wheelchair 150 feet activity     Assist      Assist Level: Dependent - Patient 0%   Blood pressure (!) 143/70, pulse 63, temperature 97.7 F (36.5 C), temperature source Oral, resp. rate 16, height 5' 9 (1.753 m), weight 112.8 kg, SpO2 99%.  Medical Problem List and Plan: 1. Functional deficits secondary to generalized weakness due to parkinson disease. Pt has DBS.              -patient may shower             -ELOS/Goals: 14-16, PT sup to CGA, OT mon/mod A, SLP sup to min A             11/30 -pt with worsening dysarthria and weakness this past week. Both HCT and EEG reviewed. No acute changes on CT and EEG demonstrates diffuse slowing. Neurology did not recommend any changes yesterday.    - I don't see any infectious, pharmaceutical, or metabolic reasons for presentation. Labs were ok yesterday   -likely progression of PD unfortunately   -continue to monitor, encourage OOB activity   -perhaps follow up with neurology this week Discussed with patient's family his progress, discussed with palliative care  - Plan for DC Monday with hospice vs palliative care   2.  Antithrombotics: -DVT/anticoagulation:  Pharmaceutical: continue Lovenox              -antiplatelet therapy: ASA  3. Episodes of blank stares: EEG ordered and is consistent with encephalopathy- no seizures evident  4. Mood/Behavior/Sleep: LCSW to follow for evaluation and support.              -antipsychotic agents: N/A  5. Neuropsych/cognition: This patient may be intermittent  capable of making decisions on his own behalf.  6. Skin/Wound Care: Routine pressure relief measures  7. Class 2 obesity: provided wife with a list of foods for weight loss, continue magnesium  supplement, d/c feeding supplement  8. PD s/p DBS/Autonomic dysfunction: Monitor for orthostatic symptoms with increase in activity             -continue sinemet  ER five times a  day.  --continue Pimavanserin  daily to  manage hallucinations  continue metanx D/c amantadine  as per wife's request Deep brain stimulator was turned back on as per wife  9. T2DM: Monitor BS ac/hs, d/c ISS to help get patient on home regimen, provided a list of foods that are good for diabetes             Increase Lantus  to 16U, Started metformin  500mg  daily with breakfast, d/c aspart  CBG (last 3)  Recent Labs  12/22/23 1634 12/22/23 2033 12/23/23 0552  GLUCAP 205* 245* 110*    Decrease CBGs to AC  Prednisone  decreased to 20mg  daily  -12/14 intermittently elevated CBGs but overall stable, CBG 110 this morning will hold off on increase Lantus  as do not want to cause AM hypoglycemia.  Could consider metformin  increase.   10. HFrEF: Strict I/O. Daily weights and monitor for signs of overload.              --Low salt diet. Continue ASA, Lasix , Toprol  XL and aldactone .    - asked nursing to weigh him  -12/13-14 wt appear stable, monitor.  Hospitalist following appreciate assistance Filed Weights   12/21/23 0500 12/22/23 0617 12/23/23 0629  Weight: 114.5 kg 112.9 kg 112.8 kg    11. Glaucoma: Continue cosopt   12. Hyperactive airway disease: continue Breo. Duonebs started for asthma. Discussed with nursing goal sats 88-92%, continue 20mg  prednisone  daily  13. Thrush: d/c nystatin  since has completed 14 days  14. AKI/CKD 3: repeat BMP tomorrow, increase daily lasix  back to 40mg  daily  -12/13 BUN and CR a little improve today, monitor  -12/14 BUN and creatinine improved today, continue current plan  15.  Thrombocytopenia: PC reviewed and is stable  -12/13 PLT 152, stable  PLT a little lower, monitor   16. Urinary retention: d/c mirabegron . Will monitor voiding with PVR checks. D/c flomax  given dizziness  17. Constipation: d/c miralax , asked nursing to please offer prune juice, increase magnesium  oxide to 400mg  daily, magnesium  level reviewed 12/11 and is    -Sorbitol  ordered 12/11  with no results, mirlax ordered 12/12, asked nursing to try suppository if this is ineffective today  -12/14 3 medium bowel movements yesterday, continue to monitor  18. Concern for seizures: monitor for signs/symptoms, EEG ordered and shows no evidence of seizures  ` - see #3  19. Spasticity/dystonia: patient follows with Dr. Carilyn for botox  outpatient  20. Mild vascular congestion and pulmonary edema with mild atelectasis on CXR: encouraged use of incentive spirometer. Discussed with patient/wife/therapy weaning O2 with therapy, incentive spirometer ordered, increase lasix  back to 40mg  daily since weight has increased  -12/13 order yesterday for thoracentesis for effusion, will check with IR if this may be dose today- discussed, recommended U/S chest to check for effusion- ordered  -12/14 radiology unable to complete right sided thoracentesis due to difficulty visualizing lung cavity, will repeat chest x-ray with radiology read indicates no definitive pleural effusion  21. Dizziness: flomax  d/ced, BP reviewed and is stable, losartan  d/ced, weight is decreased significantly, decrease lasix  to 30mg  due to worsening creatinine  22. Concern for UTI from family: UA reviewed and is negative  LOS: 18 days A FACE TO FACE EVALUATION WAS PERFORMED  Murray Collier 12/23/2023, 11:06 AM

## 2023-12-23 NOTE — Plan of Care (Signed)
°  Problem: RH Balance Goal: LTG Patient will maintain dynamic standing balance (PT) Description: LTG:  Patient will maintain dynamic standing balance with assistance during mobility activities (PT) Outcome: Adequate for Discharge   Problem: RH Bed Mobility Goal: LTG Patient will perform bed mobility with assist (PT) Description: LTG: Patient will perform bed mobility with assistance, with/without cues (PT). Outcome: Adequate for Discharge   Problem: RH Bed to Chair Transfers Goal: LTG Patient will perform bed/chair transfers w/assist (PT) Description: LTG: Patient will perform bed to chair transfers with assistance (PT). Outcome: Adequate for Discharge   Problem: RH Ambulation Goal: LTG Patient will ambulate in controlled environment (PT) Description: LTG: Patient will ambulate in a controlled environment, # of feet with assistance (PT). Outcome: Adequate for Discharge   Problem: RH Wheelchair Mobility Goal: LTG Patient will propel w/c in controlled environment (PT) Description: LTG: Patient will propel wheelchair in controlled environment, # of feet with assist (PT) Outcome: Adequate for Discharge Goal: LTG Patient will propel w/c in home environment (PT) Description: LTG: Patient will propel wheelchair in home environment, # of feet with assistance (PT). Outcome: Adequate for Discharge   Problem: RH Car Transfers Goal: LTG Patient will perform car transfers with assist (PT) Description: LTG: Patient will perform car transfers with assistance (PT). Outcome: Completed/Met

## 2023-12-23 NOTE — Progress Notes (Signed)
 Speech Language Pathology Discharge Summary  Patient Details  Name: DERL ABALOS MRN: [id number] Date of Birth: [birth year]  Date of Discharge from SLP service:December 23, 2023   Patient has met 6 of 6 long term goals.  Patient to discharge at overall Mod level.  Reasons goals not met: n/a   Clinical Impression/Discharge Summary: ***  Fair progress noted this admission. Pt was doing really well though set back noted  Care Partner:  Caregiver Able to Provide Assistance: Yes  Type of Caregiver Assistance: Physical;Cognitive  Recommendation:  24 hour supervision/assistance;Home Health SLP  Rationale for SLP Follow Up: Maximize functional communication;Maximize cognitive function and independence;Maximize swallowing safety;Reduce caregiver burden   Equipment: n/a   Reasons for discharge: Discharged from hospital   Patient/Family Agrees with Progress Made and Goals Achieved: Yes    [name] 12/23/2023, 1:40 PM

## 2023-12-23 NOTE — Progress Notes (Signed)
° °  Palliative Medicine Inpatient Follow Up Note HPI: Henry [name] is a 73 y.o. male with medical history significant of hypertension, hyperlipidemia, diabetes, hypothyroidism, CAD status post CABG, GERD, gout, CKD 3, chronic systolic CHF, status post AICD, RLS, Parkinson disease, status post deep brain stimulator, neuropathy, asthma, OSA, obesity who presented to rehab after inpatient stay. Palliative care has been asked to support additional goals of care conversations.   Today's Discussion 12/23/2023  *Please note that this is a verbal dictation therefore any spelling or grammatical errors are due to the Dragon Medical One system interpretation.  I reviewed the chart notes including nursing notes from today, progress notes from today. I also reviewed vital signs, nursing flowsheets, medication administrations record, labs, and imaging.    Oral Intake %:  75% I/O:  (+) Bowel Movements:  Last 12/13 Mobility: Able to mobilize in hall with PT  Henry [name] is alert and participating in speech therapy when I entered the room.  I was able to see him briefly and shared I would be back later for further conversation.    ________________________________________  I met with Henry [name] and his wife and daughter at bedside this late afternoon.  We discussed his current condition and how he is rehabilitated likely to the best of his abilities.  Henry [name] shares the hope to go home sooner than later during my time with them at bedside.  We reviewed the plan for Henry [name] to get home tomorrow with [facility] hospice.  All DME remain in the home as they have never been picked up after last week.  Patient's family share understanding of his condition and what to anticipate moving forward.  Questions and concerns addressed/Palliative Support Provided.   Objective Assessment: Vital Signs Vitals:   12/23/23 0427 12/23/23 0857  BP: (!) 143/70   Pulse: 60 63  Resp: 16   Temp: 97.7 F (36.5 C)   SpO2: 99%      Intake/Output Summary (Last 24 hours) at 12/23/2023 1203 Last data filed at 12/23/2023 9081 Gross per 24 hour  Intake 763 ml  Output 1800 ml  Net -1037 ml   Last Weight  Most recent update: 12/23/2023  6:29 AM    Weight  112.8 kg (248 lb 10.9 oz)           Gen: Elderly Caucasian male chronically ill in appearance HEENT: moist mucous membranes CV: Regular rate and rhythm  PULM: On 2L nasal cannula breathing is even and unlabored ABD: soft/nontender  EXT: No edema  Neuro: Awakens and is aware of person, place, situation. Fatigues quickly  SUMMARY OF RECOMMENDATIONS   DNAR/DNI  Plan for Henry [name] to transition home tomorrow with [facility] hospice ______________________________________________________________________________________ Rosaline Becton [facility] Palliative Medicine Team Team Cell Phone: [phone] Please utilize secure chat with additional questions, if there is no response within 30 minutes please call the above phone number  Time: 31  Palliative Medicine Team providers are available by phone from 7am to 7pm daily and can be reached through the team cell phone.  Should this patient require assistance outside of these hours, please call the patient's attending physician.

## 2023-12-23 NOTE — Progress Notes (Signed)
 Progress Note   Patient: Henry [name] FMW:[id number] DOB: 06-18-1950 DOA: 12/05/2023     18 DOS: the patient was seen and examined on 12/23/2023   Brief hospital course: 73 y.o. male with medical history significant of hypertension, hyperlipidemia, diabetes, hypothyroidism, CAD status post CABG, GERD, gout, CKD 3, chronic systolic CHF, status post AICD, RLS, Parkinson disease, status post deep brain stimulator, neuropathy, asthma, OSA, obesity who presented to rehab after inpatient stay. Patient was admitted 11/18-11/26.   Initially came in with weakness for 2 to 3 weeks with episodes of facial asymmetry.  Symptoms appear to be worse after changing settings of his deep brain stimulator and neurology office.   Patient was worked up and evaluated by neurology while admitted.  CT head remained stable while admitted and unable to obtain MRI brain due to deep brain stimulator.  Patient was worked up for seizure and EEG was negative for any evidence of seizure.  It is thought that his presenting symptoms are likely due to accommodation of exacerbation of his Parkinson's disease, change in the brain stimulator settings, deconditioning.  PT recommended CIR upon discharge.   Patient has been at [facility] since 11/26.  Recently has developed some issues with shortness of breath, wheezing.     Chest x-ray 12/7: Mild pulmonary edema.  Patient also received albuterol  nebulizer.  Assessment and Plan: Asthma exacerbation Patient presented with shortness of breath and wheezing High suspicion for asthma at this time however if symptoms persist could consider vocal cord dysfunction. Continue as needed nebulization Required 2L oxygen, now weaned to room air. No wheezing on exam - continued on PRN nebs -Steroids weaned off   Chronic systolic CHF Last echo was last year with EF  30%, G2 DD, mildly reduced RV function. Mild edema on chest x-ray.  - Continue with p.o. Lasix  - Continue spironolactone , losartan ,  metoprolol    Parkinson disease Status post DBS - Continue Sinemet    Hypertension - Has been continued on home Lasix , spironolactone , losartan , metoprolol    Hyperlipidemia - Remains on home Vascepa    Hypothyroidism - Continue with Synthroid    CAD Status post CABG - Continue home losartan , metoprolol , spironolactone , ASA   CKD 3 Creatinine stable - Continue to trend labs   Diabetes with hyperglycemia - Continue with insulin  per primary team   OSA  History of intolerance with CPAP due to panic attacks - Continue oxygen qHS   Obesity - Noted  Goals of Care -Palliative Care input noted -Recs for discharge to home with hospice/Palliative Care noted  As pt is now on room air and is pending dispo to hospice/palliative care, will go ahead and sign off now. Thank you for this consult. Do not hesitate to call if any further questions  Subjective: Unable to assess given mentation  Physical Exam: Vitals:   12/23/23 0427 12/23/23 0629 12/23/23 0857 12/23/23 1351  BP: (!) 143/70   121/70  Pulse: 60  63 (!) 59  Resp: 16   16  Temp: 97.7 F (36.5 C)   (!) 97.4 F (36.3 C)  TempSrc: Oral   Oral  SpO2: 99%   98%  Weight:  112.8 kg    Height:       General exam: Awake, sitting in chair, in nad Respiratory system: Normal respiratory effort, no wheezing Cardiovascular system: regular rate, s1, s2 Gastrointestinal system: Soft, nondistended, positive BS Central nervous system: CN2-12 grossly intact, strength intact Extremities: Perfused, no clubbing Skin: Normal skin turgor, no notable skin lesions seen Psychiatry: difficult  to assess given mentation  Data Reviewed:  Labs reviewed: Na 139, K 3.7, Cr 1.18, WBC 7.4, Hgb 13.5  Family Communication: Pt in room, family not at bedside  Author: Garnette Pelt, MD 12/23/2023 5:49 PM  For on call review [email or web address].

## 2023-12-23 NOTE — Progress Notes (Signed)
 Physical Therapy Session Note  Patient Details  Name: Henry [name] MRN: [id number] Date of Birth: [birth year]  Today's Date: 12/23/2023 PT Individual Time: 0815-0900 PT Individual Time Calculation (min): 45 min  and Today's Date: 12/23/2023 PT Missed Time: 15 Minutes Missed Time Reason: Other (Comment) (pt eating breakfast)  Short Term Goals: Week 3:  PT Short Term Goal 1 (Week 3): STG = LTG due to LOS  Skilled Therapeutic Interventions/Progress Updates:      Treatment Session 1   Pt seated in WC eating breakfast with assist from tech upon arrival. Pt allowed pt to eat breakfast and returned at 815. Pt missed 15 min 2/2 eating breakfast, will attempt to make up missed minutes as able.   Upon PT return. Pt agreeable to therapy. Pt denies any pain.   Pt denies any questions/concerns regarding discharge 12/15.   Pt continuously moaning. Pt denies any pain. Pt endorses I can't breathe. Pt iniitally on 1.5 L oxygen - assessed oxygen via continuous monitoring and dynamap SpO2 94 - Pt increased to 2L oxygen.  PT encouraging pursed lip versus mouth breathing throughout session.  Notified nurse. Nurse also present to assess, and administer medications.   Pt performed sit to stand with RW and min A, max verbal cues provided for UE positioning and anterior weight shift. Required increased time to disconnect purewick while pt stood with RW and CGA.   Pt transported dependent in TIS to ortho gym.   Pt performed stand pivot transfer to car simulator with increased time and heavy min A, verbal cues provided for sequencing and technique. Pt managed B LE with supervision and increased time.   Pt seated in WC at end of session with all needs within reach and chair alarm on.           Therapy Documentation Precautions:  Precautions Precautions: Fall Recall of Precautions/Restrictions: Impaired Precaution/Restrictions Comments: Parkinson's, deep brain stimulator, R ear  deaf Restrictions Weight Bearing Restrictions Per Provider Order: No  Therapy/Group: Individual Therapy  [facility] Doreene Orris, [place], DPT  12/23/2023, 7:30 AM

## 2023-12-23 NOTE — Progress Notes (Signed)
 Occupational Therapy Session Note  Patient Details  Name: DEVLYN RETTER MRN: [id number] Date of Birth: 1950/01/25  Today's Date: 12/23/2023 OT Individual Time: 0945-1100 OT Individual Time Calculation (min): 75 min    Short Term Goals: Week 1:  OT Short Term Goal 1 (Week 1): (P) Pt will complete UB bathing with Mod A. OT Short Term Goal 1 - Progress (Week 1): Progressing toward goal OT Short Term Goal 2 (Week 1): (P) Pt will complete UB dressing with Mod A. OT Short Term Goal 2 - Progress (Week 1): Progressing toward goal OT Short Term Goal 3 (Week 1): (P) Pt will complete step pivot transfer from bed <> WC with Mod Ax1 and RW if needed. OT Short Term Goal 3 - Progress (Week 1): Met  Skilled Therapeutic Interventions/Progress Updates:    Patient seated at w/c LOF at the time of arrival.  The pt indicated that he rested during the night with no pain to report at the time of treatment. The pt was in agreement with completing BADL related task.  The pt was able to self feed apple sauce using a plastic spoon with a towel in place for spillage. The pt was encouraged to raise his R shld slightly when bring the spoon to his mouth for less spillage. The pt went on to complete sit to stands using the stedy with close S 4x for static standing for a count of 10. The pt completed UB exercises using resistive band 1 set of 10 for horizontal abduction, shld flexion, lifts and elbow extensions with rest breaks as needed, the patient requried 3 rest breaks. The pt completed the session with the UB cycle while seated at w/c LOF. At the end of the session, the patient was placed in tilted position with his feet support and all additional needs addressed.  Therapy Documentation Precautions:  Precautions Precautions: Fall Recall of Precautions/Restrictions: Impaired Precaution/Restrictions Comments: Parkinson's, deep brain stimulator, R ear deaf Restrictions Weight Bearing Restrictions Per Provider Order:  No Therapy/Group: Individual Therapy  [name] 12/23/2023, 12:40 PM

## 2023-12-23 NOTE — Hospital Course (Signed)
 73 y.o. male with medical history significant of hypertension, hyperlipidemia, diabetes, hypothyroidism, CAD status post CABG, GERD, gout, CKD 3, chronic systolic CHF, status post AICD, RLS, Parkinson disease, status post deep brain stimulator, neuropathy, asthma, OSA, obesity who presented to rehab after inpatient stay. Patient was admitted 11/18-11/26.   Initially came in with weakness for 2 to 3 weeks with episodes of facial asymmetry.  Symptoms appear to be worse after changing settings of his deep brain stimulator and neurology office.   Patient was worked up and evaluated by neurology while admitted.  CT head remained stable while admitted and unable to obtain MRI brain due to deep brain stimulator.  Patient was worked up for seizure and EEG was negative for any evidence of seizure.  It is thought that his presenting symptoms are likely due to accommodation of exacerbation of his Parkinson's disease, change in the brain stimulator settings, deconditioning.  PT recommended CIR upon discharge.   Patient has been at [facility] since 11/26.  Recently has developed some issues with shortness of breath, wheezing.     Chest x-ray 12/7: Mild pulmonary edema.  Patient also received albuterol  nebulizer.

## 2023-12-24 ENCOUNTER — Encounter: Payer: Self-pay | Admitting: Neurology

## 2023-12-24 ENCOUNTER — Encounter: Admitting: Neurology

## 2023-12-24 ENCOUNTER — Other Ambulatory Visit: Payer: Self-pay | Admitting: Internal Medicine

## 2023-12-24 ENCOUNTER — Other Ambulatory Visit: Payer: Self-pay

## 2023-12-24 ENCOUNTER — Inpatient Hospital Stay (HOSPITAL_COMMUNITY)

## 2023-12-24 ENCOUNTER — Other Ambulatory Visit (HOSPITAL_COMMUNITY): Payer: Self-pay

## 2023-12-24 DIAGNOSIS — G20A1 Parkinson's disease without dyskinesia, without mention of fluctuations: Secondary | ICD-10-CM

## 2023-12-24 LAB — GLUCOSE, CAPILLARY
Glucose-Capillary: 130 mg/dL — ABNORMAL HIGH (ref 70–99)
Glucose-Capillary: 168 mg/dL — ABNORMAL HIGH (ref 70–99)

## 2023-12-24 MED ORDER — BUDESONIDE 0.25 MG/2ML IN SUSP: 0.2500 mg | Freq: Two times a day (BID) | RESPIRATORY_TRACT | 0 refills | Status: DC

## 2023-12-24 MED ORDER — ARFORMOTEROL TARTRATE 15 MCG/2ML IN NEBU: 15.0000 ug | INHALATION_SOLUTION | Freq: Two times a day (BID) | RESPIRATORY_TRACT | 0 refills | Status: DC

## 2023-12-24 MED ORDER — INSULIN GLARGINE 100 UNIT/ML ~~LOC~~ SOLN
17.0000 [IU] | Freq: Every day | SUBCUTANEOUS | Status: DC
  Filled 2023-12-24: qty 0.17

## 2023-12-24 MED ORDER — IPRATROPIUM-ALBUTEROL 0.5-2.5 (3) MG/3ML IN SOLN: 3.0000 mL | RESPIRATORY_TRACT | 6 refills | Status: DC | PRN

## 2023-12-24 NOTE — Plan of Care (Signed)
°  Problem: RH Swallowing Goal: LTG Patient will consume least restrictive diet using compensatory strategies with assistance (SLP) Description: LTG:  Patient will consume least restrictive diet using compensatory strategies with assistance (SLP) Outcome: Completed/Met   Problem: RH Expression Communication Goal: LTG Patient will express needs/wants via multi-modal(SLP) Description: LTG:  Patient will express needs/wants via multi-modal communication (gestures/written, etc) with cues (SLP) Outcome: Completed/Met Goal: LTG Patient will increase speech intelligibility (SLP) Description: LTG: Patient will increase speech intelligibility at word/phrase/conversation level with cues, % of the time (SLP) Outcome: Completed/Met   Problem: RH Problem Solving Goal: LTG Patient will demonstrate problem solving for (SLP) Description: LTG:  Patient will demonstrate problem solving for basic/complex daily situations with cues  (SLP) Outcome: Completed/Met   Problem: RH Memory Goal: LTG Patient will use memory compensatory aids to (SLP) Description: LTG:  Patient will use memory compensatory aids to recall biographical/new, daily complex information with cues (SLP) Outcome: Completed/Met   Problem: RH Attention Goal: LTG Patient will demonstrate this level of attention during functional activites (SLP) Description: LTG:  Patient will will demonstrate this level of attention during functional activites (SLP) Outcome: Completed/Met

## 2023-12-24 NOTE — Progress Notes (Signed)
 Inpatient Rehabilitation Care Coordinator Discharge Note   Patient Details  Name: Henry [name] MRN: [id number] Date of Birth: Jan 21, 1950   Discharge location: Home with [facility]  Length of Stay: 18 days  Discharge activity level: Moderate Assistance - Patient 50 - 74%  Home/community participation: Homebound  Patient response un:Yzjouy Literacy - How often do you need to have someone help you when you read instructions, pamphlets, or other written material from your doctor or pharmacy?: Patient unable to respond (AMS)  Patient response un:Dnrpjo Isolation - How often do you feel lonely or isolated from those around you?: Patient unable to respond (AMS)  Services provided included: MD, RD, PT, OT, RN, SLP, CM, TR, Pharmacy, SW  Financial Services:  Financial Services Utilized: Medicare    Choices offered to/list presented to: Family  Follow-up services arranged:  Other (Comment) [facility])           Patient response to transportation need: Is the patient able to respond to transportation needs?: Yes In the past 12 months, has lack of transportation kept you from medical appointments or from getting medications?: No In the past 12 months, has lack of transportation kept you from meetings, work, or from getting things needed for daily living?: No   Patient/Family verbalized understanding of follow-up arrangements:  Yes  Individual responsible for coordination of the follow-up plan: Patient's wife [name]  Confirmed correct DME delivered: [name]  Loreli 12/24/2023    Comments (or additional information): Patient discharging home via [facility] at 1pm  with [facility]. All DME delivered including C-Pap machine. Family aware and will be coming to collect the rest of his belongings.   Summary of Stay    Date/Time Discharge Planning CSW  12/19/23 1807 Home with family - May resume services with [facility] upon discharge home. DS   12/19/23 0831 Home with [facility]. Awaiting delivery of DME to the home before discharging DS  12/11/23 1033 Plans to discharge home with his wife, daughter and other family support. Has DME and a ramp. Awaiting therapy follow-up recommendations. DS       [name]  Loreli

## 2023-12-24 NOTE — Progress Notes (Signed)
 Inpatient Rehabilitation Discharge Medication Review by a Pharmacist  A complete drug regimen review was completed for this patient to identify any potential clinically significant medication issues.  High Risk Drug Classes Is patient taking? Indication by Medication  Antipsychotic No   Anticoagulant No   Antibiotic No   Opioid No   Antiplatelet Yes Aspirin  81 mg - h/o MI, CAD s/p CABG  Hypoglycemics/insulin  Yes Insulin  pump (Omnipod) - DM   Vasoactive Medication Yes Furosemide , metoprolol  XL, spironolactone  -  hypertension  Chemotherapy No   Other Yes Allopurinol  - gout Brovana  and Pulmicort  nebs - COPD Cosopt  eye drops - glaucoma Icosapent , rosuvastatin  - mixed hyperlipidemia Levothyroxine  - hypothyroid supplement Pimavanserin  - Parkinson disease psychosis Sinemet  CR -  Parkinson's disease s/p DBS/Autonomic dysfunction  Vitamin D , magnesium  oxide, metanx - supplements Senna-docusate, miralax  - laxatives  PRNs: Albuterol  inhaler - shortness of breath, wheezing Acetaminophen  - mild pain, fever or headache Guaifenesin -dextromethorphan  - cough Melatonin - sleep Nitroglycerin  SL - chest pain Theraworx Pain Cream - topical pain relief     Type of Medication Issue Identified Description of Issue Recommendation(s)  Drug Interaction(s) (clinically significant)     Duplicate Therapy     Allergy     No Medication Administration End Date     Incorrect Dose     Additional Drug Therapy Needed     Significant med changes from prior encounter (inform family/care partners about these prior to discharge). Losartan , Myrbetriq , Mounjaro , vitamin B-12 and potassium discontinued.  Cymbalta  stopped per patient preference.  Wixela inhaler to Brovana  and Pulmicort  nebs.  SSI and Insulin  glargine and metformin  while in the hospital.  Communicate changed with patient /family/ caregiver prior to discharge.        Back to PTA Ominpod at discharge.   Other       Clinically  significant medication issues were identified that warrant physician communication and completion of prescribed/recommended actions by midnight of the next day:  No  Name of provider notified for urgent issues identified:   Provider Method of Notification: n/a  Pharmacist comments:  - Nystatin  oral suspension for thrush completed 12/19/23 - Enoxaparin  for VTE prophylaxis initiated during inpatient stay; not on prior to admit. - potassium normal off supplement and on furosemide  and spironolactone   Time spent performing this drug regimen review (minutes):  [address]  Henry [name], [place] 12/24/2023 10:26 AM

## 2023-12-24 NOTE — Progress Notes (Signed)
 RT assessed patient for CPAP w/ bleed in o2. Patient tried wearing cpap and was not tolerant of CPAP. CPAP is on standby

## 2023-12-24 NOTE — Progress Notes (Signed)
 PROGRESS NOTE   Subjective/Complaints: Plan to d/c to hospice today Patient is resting comfortably, MEWS 0 No issues overnight except for inability to tolerate CPAP     12/24/2023    4:56 AM 12/24/2023    4:21 AM 12/24/2023    1:36 AM  Vitals with BMI  Weight 248 lbs 7 oz    BMI 36.67    Systolic  130   Diastolic  79   Pulse  76 63    Recent Labs    12/23/23 1629 12/23/23 2027 12/24/23 0604  GLUCAP 194* 199* 130*     ROS: +diffuse weakness, denies pain, HA, SOB  +urinary urgency, improving ambulation with therapy, cognition-  fluctuates on a daily basis  Objective:   US  THORACENTESIS ASP PLEURAL SPACE W/IMG GUIDE Result Date: 12/22/2023 INDICATION: 73 year old male with hypoxia, bilateral pleural effusions. IR was requested for diagnostic and therapeutic thoracentesis EXAM: ULTRASOUND GUIDED DIAGNOSTIC AND THERAPEUTIC THORACENTESIS MEDICATIONS: 7 mL of 1% lidocaine  COMPLICATIONS: None immediate. PROCEDURE: An ultrasound guided thoracentesis was thoroughly discussed with the patient's wife, and questions answered. The benefits, risks, alternatives and complications were also discussed. The patient's wife understands and wishes to proceed with the procedure. Written consent was obtained. Ultrasound was performed to localize and mark an adequate pocket of fluid in the right chest. Ultrasound visualization of thorax is limited by patient's body habitus. The area was then prepped and draped in the normal sterile fashion. 1% Lidocaine  was used for local anesthesia. Unfortunately, poor visualization under ultrasound prevented safe advance of safety-T needle into right thorax, and thoracentesis attempt was ultimately aborted. The catheter was removed and a dressing applied. FINDINGS: Unsuccessful attempt at right thoracentesis. IMPRESSION: Unsuccessful ultrasound guided right thoracentesis attempt. Procedure performed by: Carlin Griffon, PA-C Electronically Signed   By: Ester Sides M.D.   On: 12/22/2023 20:22   DG Chest Port 1 View Result Date: 12/22/2023 CLINICAL DATA:  [id number] S/P thoracentesis [id number] EXAM: PORTABLE CHEST 1 VIEW COMPARISON:  December 21, 2023 FINDINGS: The cardiomediastinal silhouette is unchanged and enlarged in contour.Unchanged elevation of the RIGHT hemidiaphragm. LEFT chest AICD. Status post median sternotomy. Nerve stimulator device. No definitive pleural effusion. No definitive pneumothorax. No acute pleuroparenchymal abnormality. IMPRESSION: No definitive pneumothorax. Electronically Signed   By: Corean Salter M.D.   On: 12/22/2023 18:03     Recent Labs    12/22/23 0553 12/23/23 0652  WBC 7.1 7.4  HGB 13.4 13.5  HCT 42.9 43.0  PLT 152 132*   Recent Labs    12/22/23 0553 12/23/23 0652  NA 141 139  K 4.2 3.7  CL 97* 92*  CO2 38* 40*  GLUCOSE 142* 124*  BUN 30* 27*  CREATININE 1.27* 1.18  CALCIUM  8.8* 8.6*    Intake/Output Summary (Last 24 hours) at 12/24/2023 0942 Last data filed at 12/24/2023 0800 Gross per 24 hour  Intake 417 ml  Output 3100 ml  Net -2683 ml         Physical Exam: Vital Signs Blood pressure 130/79, pulse 76, temperature 97.8 F (36.6 C), temperature source Oral, resp. rate 16, height 5' 9 (1.753 m), weight 112.7 kg, SpO2 94%. Constitutional: No distress . Vital  signs reviewed. Obese. Sitting in WC, appears comfortable  HEENT: NCAT, EOMI, oral membranes moist Neck: supple Cardiovascular: RRR Respiratory/Chest: Decreased breath sounds at bases, nonlabored breathing, on 2 L nasal cannula GI/Abdomen: BS +, non-tender, non-distended Ext: no clubbing, cyanosis, or edema Psych: flat but cooperative  Skin: Bruising noted on bilateral upper extremities--stable Neuro: Alert and awake, follows simple commands, moving all 4 extremities to gravity, sensation intact to light touch in all 4 extremities, delayed responses    Prior exam: Mental  Status: AAOx to person and place; partially to time (month as November)  + memory deficits present, intermittant confusion, delayed responses,   Speech/Languate:  severe dysarthria  follows simple commands. Pill rolling tremor bilaterally CRANIAL NERVES: II: PERRL. Visual fields full III, IV, VI: EOM intact, no gaze preference or deviation V: normal sensation bilaterally VII: no asymmetry VIII: normal hearing to speech IX, X: normal palatal elevation XI: head turn intact b/l XII: Tongue midline     MOTOR: 4-/5 strength throughout SENSORY: Normal to touch all 4 extremities   persistent rigidity in b/l legs Right greater than left--ongoing, stable 12/15    Assessment/Plan: 1. Functional deficits which require 3+ hours per day of interdisciplinary therapy in a comprehensive inpatient rehab setting. Physiatrist is providing close team supervision and 24 hour management of active medical problems listed below. Physiatrist and rehab team continue to assess barriers to discharge/monitor patient progress toward functional and medical goals  Care Tool:  Bathing    Body parts bathed by patient: Face, Right arm, Left arm, Chest   Body parts bathed by helper: Abdomen, Front perineal area, Buttocks, Right upper leg, Left upper leg, Right lower leg, Left lower leg     Bathing assist Assist Level: Maximal Assistance - Patient 24 - 49%     Upper Body Dressing/Undressing Upper body dressing   What is the patient wearing?: Pull over shirt    Upper body assist Assist Level: Moderate Assistance - Patient 50 - 74%    Lower Body Dressing/Undressing Lower body dressing      What is the patient wearing?: Pants, Incontinence brief     Lower body assist Assist for lower body dressing: Total Assistance - Patient < 25%     Toileting Toileting    Toileting assist Assist for toileting: Total Assistance - Patient < 25%     Transfers Chair/bed transfer  Transfers assist      Chair/bed transfer assist level: Minimal Assistance - Patient > 75%     Locomotion Ambulation   Ambulation assist   Ambulation activity did not occur: Safety/medical concerns  Assist level: Contact Guard/Touching assist Assistive device: Other (comment) (u step RW) Max distance: 23ft   Walk 10 feet activity   Assist  Walk 10 feet activity did not occur: Safety/medical concerns  Assist level: Contact Guard/Touching assist Assistive device: Other (comment) (u step walker)   Walk 50 feet activity   Assist Walk 50 feet with 2 turns activity did not occur: Safety/medical concerns  Assist level: Contact Guard/Touching assist      Walk 150 feet activity   Assist Walk 150 feet activity did not occur: Safety/medical concerns  Assist level: Minimal Assistance - Patient > 75% Assistive device: Walker-rolling    Walk 10 feet on uneven surface  activity   Assist Walk 10 feet on uneven surfaces activity did not occur: Safety/medical concerns (fatigue, decreased balance, poor sequencing)         Wheelchair     Assist Is the patient using  a wheelchair?: Yes Type of Wheelchair:  (TIS WC)    Wheelchair assist level: Dependent - Patient 0% Max wheelchair distance: >334ft    Wheelchair 50 feet with 2 turns activity    Assist        Assist Level: Dependent - Patient 0%   Wheelchair 150 feet activity     Assist      Assist Level: Dependent - Patient 0%   Blood pressure 130/79, pulse 76, temperature 97.8 F (36.6 C), temperature source Oral, resp. rate 16, height 5' 9 (1.753 m), weight 112.7 kg, SpO2 94%.  Medical Problem List and Plan: 1. Functional deficits secondary to generalized weakness due to parkinson disease. Pt has DBS.              -patient may shower             -ELOS/Goals: 14-16, PT sup to CGA, OT mon/mod A, SLP sup to min A             11/30 -pt with worsening dysarthria and weakness this past week. Both HCT and EEG reviewed.  No acute changes on CT and EEG demonstrates diffuse slowing. Neurology did not recommend any changes yesterday.    - I don't see any infectious, pharmaceutical, or metabolic reasons for presentation. Labs were ok yesterday   -likely progression of PD unfortunately   -continue to monitor, encourage OOB activity   -perhaps follow up with neurology this week Discussed with patient's family his progress, discussed with palliative care  D/c home to hospice  2.  Antithrombotics: -DVT/anticoagulation:  Pharmaceutical: continue Lovenox              -antiplatelet therapy: ASA  3. Episodes of blank stares: EEG ordered and is consistent with encephalopathy- no seizures evident  4. Mood/Behavior/Sleep: LCSW to follow for evaluation and support.              -antipsychotic agents: N/A  5. Neuropsych/cognition: This patient may be intermittent  capable of making decisions on his own behalf.  6. Skin/Wound Care: Routine pressure relief measures  7. Class 2 obesity: provided wife with a list of foods for weight loss, continue magnesium  supplement, d/c feeding supplement  8. PD s/p DBS/Autonomic dysfunction: Monitor for orthostatic symptoms with increase in activity             -continue sinemet  ER five times a day.  --continue Pimavanserin  daily to  manage hallucinations  continue metanx D/c amantadine  as per wife's request Deep brain stimulator was turned back on as per wife  9. T2DM: Monitor BS ac/hs, d/c ISS to help get patient on home regimen, provided a list of foods that are good for diabetes             Increase Lantus  to 17U, Started metformin  500mg  daily with breakfast, d/c aspart  CBG (last 3)  Recent Labs    12/23/23 1629 12/23/23 2027 12/24/23 0604  GLUCAP 194* 199* 130*    Decrease CBGs to [facility]  Prednisone  decreased to 20mg  daily  -12/14 intermittently elevated CBGs but overall stable, CBG 110 this morning will hold off on increase Lantus  as do not want to cause AM hypoglycemia.   Could consider metformin  increase.   10. HFrEF: Strict I/O. Daily weights and monitor for signs of overload.              --Low salt diet. Continue ASA, Lasix , Toprol  XL and aldactone .    - asked nursing to weigh him  -12/13-14  wt appear stable, monitor.  Hospitalist following appreciate assistance Filed Weights   12/22/23 0617 12/23/23 0629 12/24/23 0456  Weight: 112.9 kg 112.8 kg 112.7 kg    11. Glaucoma: Continue cosopt   12. Hyperactive airway disease: continue Breo. Duonebs started for asthma. Discussed with nursing goal sats 88-92%, continue 20mg  prednisone  daily  13. Thrush: d/c nystatin  since has completed 14 days  14. AKI/CKD 3: repeat BMP tomorrow, increase daily lasix  back to 40mg  daily  -12/13 BUN and CR a little improve today, monitor  -12/14 BUN and creatinine improved today, continue current plan  15.  Thrombocytopenia: PC reviewed and is stable  -12/13 PLT 152, stable  PLT a little lower, monitor   16. Urinary retention: d/c mirabegron . Will monitor voiding with PVR checks. D/c flomax  given dizziness  17. Constipation: d/c miralax , asked nursing to please offer prune juice, increase magnesium  oxide to 400mg  daily, magnesium  level reviewed 12/11 and is    -Sorbitol  ordered 12/11 with no results, mirlax ordered 12/12, asked nursing to try suppository if this is ineffective today  -12/14 3 medium bowel movements yesterday, continue to monitor  18. Concern for seizures: monitor for signs/symptoms, EEG ordered and shows no evidence of seizures  ` - see #3  19. Spasticity/dystonia: patient follows with Dr. Carilyn for botox  outpatient  20. Mild vascular congestion and pulmonary edema with mild atelectasis on CXR: encouraged use of incentive spirometer. Discussed with patient/wife/therapy weaning O2 with therapy, incentive spirometer ordered, increase lasix  back to 40mg  daily since weight has increased  -12/13 order yesterday for thoracentesis for effusion, will check  with IR if this may be dose today- discussed, recommended U/S chest to check for effusion- ordered  -12/14 radiology unable to complete right sided thoracentesis due to difficulty visualizing lung cavity, will repeat chest x-ray with radiology read indicates no definitive pleural effusion  21. Dizziness: flomax  d/ced, BP reviewed and is stable, losartan  d/ced, weight is decreased significantly, decrease lasix  to 30mg  due to worsening creatinine  22. Concern for UTI from family: UA reviewed and is negative  LOS: 19 days A FACE TO FACE EVALUATION WAS PERFORMED  Henry [name] Henry [name] 12/24/2023, 9:42 AM

## 2023-12-24 NOTE — Progress Notes (Signed)
° °  Palliative Medicine Inpatient Follow Up Note HPI: Henry [name] is a 73 y.o. male with medical history significant of hypertension, hyperlipidemia, diabetes, hypothyroidism, CAD status post CABG, GERD, gout, CKD 3, chronic systolic CHF, status post AICD, RLS, Parkinson disease, status post deep brain stimulator, neuropathy, asthma, OSA, obesity who presented to rehab after inpatient stay. Palliative care has been asked to support additional goals of care conversations.   Today's Discussion 12/24/2023  *Please note that this is a verbal dictation therefore any spelling or grammatical errors are due to the Dragon [employer] system interpretation.  I reviewed the chart notes including nursing notes from today, progress notes from today. I also reviewed vital signs, nursing flowsheets, medication administrations record, labs, and imaging.    Oral Intake %:  75% I/O:  (-)  460 Bowel Movements:  Last 12/14 Mobility: Able to mobilize in hall with PT  I met with Henry [name] this morning. He is in good spirits. We reviewed the plan for him to transition home this afternoon with hospice.   Questions and concerns addressed/Palliative Support Provided.   Objective Assessment: Vital Signs Vitals:   12/24/23 0421 12/24/23 0840  BP: 130/79   Pulse: 76   Resp: 16   Temp: 97.8 F (36.6 C)   SpO2: 91% 94%    Intake/Output Summary (Last 24 hours) at 12/24/2023 1226 Last data filed at 12/24/2023 0800 Gross per 24 hour  Intake 417 ml  Output 2100 ml  Net -1683 ml   Last Weight  Most recent update: 12/24/2023  4:56 AM    Weight  112.7 kg (248 lb 7.3 oz)           Gen: Elderly Caucasian male chronically ill in appearance HEENT: moist mucous membranes CV: Regular rate and rhythm  PULM: On 2L nasal cannula breathing is even and unlabored ABD: soft/nontender  EXT: No edema  Neuro: Awakens and is aware of person, place, situation. Fatigues quickly  SUMMARY OF RECOMMENDATIONS    DNAR/DNI  Plan for Henry [name] to transition home this afternoon with [facility] hospice ______________________________________________________________________________________ Rosaline Becton [facility] Palliative Medicine Team Team Cell Phone: [phone] Please utilize secure chat with additional questions, if there is no response within 30 minutes please call the above phone number  Time: 48  Palliative Medicine Team providers are available by phone from 7am to 7pm daily and can be reached through the team cell phone.  Should this patient require assistance outside of these hours, please call the patient's attending physician.

## 2023-12-24 NOTE — Discharge Summary (Signed)
 Physician Discharge Summary  Patient ID: Henry [name] MRN: [id number] DOB/AGE: 02/15/1950 73 y.o.  Admit date: 12/05/2023 Discharge date: 12/24/2023  Discharge Diagnoses:  Principal Problem:   Parkinson's disease [facility]) Active Problems:   Type 2 diabetes mellitus with chronic kidney disease, with long-term current use of insulin  (HCC)   Hyperlipidemia   HTN (hypertension)   Hx of CABG x 3 in 2000   GERD   OSA (obstructive sleep apnea)   Obesity, morbid (HCC)   Chronic systolic CHF (congestive heart failure) (HCC)   Restless leg syndrome   Neuropathy   CKD (chronic kidney disease) stage 3, GFR 30-59 ml/min (HCC)   Thrombocytopenia   CAD (coronary artery disease)   Asthma   Acute respiratory failure with hypoxia (HCC)   Discharged Condition: fair  Significant Diagnostic Studies: US  THORACENTESIS ASP PLEURAL SPACE W/IMG GUIDE Result Date: 12/22/2023 INDICATION: 73 year old male with hypoxia, bilateral pleural effusions. IR was requested for diagnostic and therapeutic thoracentesis EXAM: ULTRASOUND GUIDED DIAGNOSTIC AND THERAPEUTIC THORACENTESIS MEDICATIONS: 7 mL of 1% lidocaine  COMPLICATIONS: None immediate. PROCEDURE: An ultrasound guided thoracentesis was thoroughly discussed with the patient's wife, and questions answered. The benefits, risks, alternatives and complications were also discussed. The patient's wife understands and wishes to proceed with the procedure. Written consent was obtained. Ultrasound was performed to localize and mark an adequate pocket of fluid in the right chest. Ultrasound visualization of thorax is limited by patient's body habitus. The area was then prepped and draped in the normal sterile fashion. 1% Lidocaine  was used for local anesthesia. Unfortunately, poor visualization under ultrasound prevented safe advance of safety-T needle into right thorax, and thoracentesis attempt was ultimately aborted. The catheter was removed and a dressing applied.  FINDINGS: Unsuccessful attempt at right thoracentesis. IMPRESSION: Unsuccessful ultrasound guided right thoracentesis attempt. Procedure performed by: Carlin Griffon, PA-C Electronically Signed   By: Ester Sides M.D.   On: 12/22/2023 20:22   DG Chest Port 1 View Result Date: 12/22/2023 CLINICAL DATA:  [id number] S/P thoracentesis [id number] EXAM: PORTABLE CHEST 1 VIEW COMPARISON:  December 21, 2023 FINDINGS: The cardiomediastinal silhouette is unchanged and enlarged in contour.Unchanged elevation of the RIGHT hemidiaphragm. LEFT chest AICD. Status post median sternotomy. Nerve stimulator device. No definitive pleural effusion. No definitive pneumothorax. No acute pleuroparenchymal abnormality. IMPRESSION: No definitive pneumothorax. Electronically Signed   By: Corean Salter M.D.   On: 12/22/2023 18:03   DG Chest 2 View Result Date: 12/21/2023 EXAM: 2 VIEW(S) XRAY OF THE CHEST 12/21/2023 04:23:00 PM COMPARISON: 12/17/2023 CLINICAL HISTORY: Hypoxia. FINDINGS: LINES, TUBES AND DEVICES: Right neck stimulator. LUNGS AND PLEURA: Low lung volumes with vascular crowding. No pleural effusion. No pneumothorax. HEART AND MEDIASTINUM: Left chest AICD. Postsurgical changes from CABG. Cardiomegaly. BONES AND SOFT TISSUES: Median sternotomy. No acute osseous abnormality. IMPRESSION: 1. Cardiomegaly with low lung volumes. Electronically signed by: Pinkie Pebbles MD 12/21/2023 09:22 PM EST RP Workstation: [id number]   DG Chest 2 View Result Date: 12/17/2023 EXAM: 2 VIEW(S) XRAY OF THE CHEST 12/17/2023 05:43:00 PM COMPARISON: 12/16/2023 CLINICAL HISTORY: Hypoxia. FINDINGS: LINES, TUBES AND DEVICES: Left chest wall ICD and right chest battery packs for stimulator devices unchanged. LUNGS AND PLEURA: Low lung volumes. Elevated right hemidiaphragm. Bilateral interstitial opacities. Bilateral pleural effusions. No pneumothorax. HEART AND MEDIASTINUM: Similar cardiomegaly. BONES AND SOFT TISSUES: Prior median sternotomy. No  acute osseous abnormality. IMPRESSION: 1. Stable. 2. Bilateral interstitial opacities and pleural effusions. 3. Cardiomegaly, similar to the prior study. 4. Low lung volumes and elevated right hemidiaphragm. Electronically signed by:  Morgane Naveau MD 12/17/2023 08:37 PM EST RP Workstation: [id number]   DG Chest Port 1 View Result Date: 12/16/2023 EXAM: 1 VIEW XRAY OF THE CHEST 12/16/2023 09:17:00 PM COMPARISON: 12/06/2023 CLINICAL HISTORY: Acute respiratory distress FINDINGS: LINES, TUBES AND DEVICES: Neural stimulator over right chest. Left chest pacemaker/ICD noted. LUNGS AND PLEURA: Low lung volumes. Mild pulmonary edema. Elevated right hemidiaphragm. Bibasilar atelectasis. No pleural effusion. No pneumothorax. HEART AND MEDIASTINUM: Cardiomegaly. Post median sternotomy and CABG. unchanged cardiomediastinal silhouette. BONES AND SOFT TISSUES: Post median sternotomy and CABG. Neural stimulator over right chest. Left chest pacemaker/ICD noted. No acute osseous abnormality. IMPRESSION: 1. Mild pulmonary edema. 2. Low lung volumes. Electronically signed by: Morgane Naveau MD 12/16/2023 09:22 PM EST RP Workstation: [id number]   DG Swallowing Func-Speech Pathology Result Date: 12/14/2023 Table formatting from the original result was not included. Modified Barium Swallow Study Patient Details Name: Henry [name] MRN: [id number] Date of Birth: Aug 26, 1950 Today's Date: 12/14/2023 HPI/PMH: HPI: Henry [name] is a 73 year old male with history of PD s/p DBS, autonomic dysfunction, T2DM, Chronic systolic and diastolic CHF, ICM w/chronic systolic/diastolic HF, PAF- not on AC due to falls, OSA, HOH, MI, recent episodes of staring with poor responsiveness and DBS adjusted. He was admitted on 11/27/23 with reports of staring BLE edema, episodes, with decrease in LOC and leaning to the right. left sided weakness and difficulty walking. BLE dopplers negative for DVT. CT head repeated and negative for acute changes.    LT-EEG ordered and showed mild generalized dysfunction without epileptiform discharge or seizures. No documented prior objective swallow study has been completed despite PD diagnosis. Clinical Impression: Clinical Impression: Patient presents with mild oral and pharyngoesophageal dysphagia characterized by global inefficiency spanning all involved swallowing mechanisms. Importantly, patient's airway protection mechanisms remain intact across all presentations. Orally, patient demonstrates slow tongue motion, prolonged mastication, and diffuse mild oral residue with all textures. Patient's swallow is timely and hyolaryngeal elevation/excursion and epiglottic inversion appear intact. Patient presents with patent esophagus at rest and reduced pharyngeal (and suspected esophageal) peristalsis resulting in mild residue in the pyriform sinuses and upper esophageal segment. Residue appears to be most significant with thin liquids and reduces in amount as bolus viscosity increases. SLP trialed pill in thin liquids with patient exhibiting mild difficulty with oral AP transit but otherwise no bolus misdirection or pharyngeal retention noted. Recommend continuation of regular/thin liquid diet with medications administered whole with thin liquids. SLP will continue to follow to monitor diet tolerance and provide education to family re: importance of monitoring swallowing function across progression of PD. Factors that may increase risk of adverse event in presence of aspiration Noe & Lianne 2021): Factors that may increase risk of adverse event in presence of aspiration Noe & Lianne 2021): Reduced cognitive function; Limited mobility; Weak cough; Dependence for feeding and/or oral hygiene Recommendations/Plan: Swallowing Evaluation Recommendations Swallowing Evaluation Recommendations Recommendations: PO diet PO Diet Recommendation: Regular; Thin liquids (Level 0) Liquid Administration via: Cup; Straw Medication  Administration: Whole meds with liquid Supervision: Full assist for feeding Swallowing strategies  : Minimize environmental distractions; Slow rate Postural changes: Position pt fully upright for meals Oral care recommendations: Oral care BID (2x/day) Treatment Plan Treatment Plan Treatment recommendations: Therapy as outlined in treatment plan below Follow-up recommendations: Home health SLP Functional status assessment: Patient has had a recent decline in their functional status and demonstrates the ability to make significant improvements in function in a reasonable and predictable amount of time. Treatment frequency: Min 2x/week Treatment  duration: 2 weeks Interventions: Patient/family education; Diet toleration management by SLP Recommendations Recommendations for follow up therapy are one component of a multi-disciplinary discharge planning process, led by the attending physician.  Recommendations may be updated based on patient status, additional functional criteria and insurance authorization. Assessment: Orofacial Exam: Orofacial Exam Oral Cavity: Oral Hygiene: WFL Oral Cavity - Dentition: Adequate natural dentition Orofacial Anatomy: WFL Oral Motor/Sensory Function: Generalized oral weakness Anatomy: Anatomy: WFL Boluses Administered: Boluses Administered Boluses Administered: Mildly thick liquids (Level 2, nectar thick); Thin liquids (Level 0); Moderately thick liquids (Level 3, honey thick); Puree; Solid  Oral Impairment Domain: Oral Impairment Domain Lip Closure: Escape progressing to mid-chin Tongue control during bolus hold: Cohesive bolus between tongue to palatal seal Bolus preparation/mastication: Slow prolonged chewing/mashing with complete recollection Bolus transport/lingual motion: Slow tongue motion Oral residue: Residue collection on oral structures Location of oral residue : Tongue; Palate Initiation of pharyngeal swallow : Valleculae  Pharyngeal Impairment Domain: Pharyngeal Impairment  Domain Soft palate elevation: No bolus between soft palate (SP)/pharyngeal wall (PW) Laryngeal elevation: Complete superior movement of thyroid  cartilage with complete approximation of arytenoids to epiglottic petiole Anterior hyoid excursion: Complete anterior movement Epiglottic movement: Complete inversion Laryngeal vestibule closure: Complete, no air/contrast in laryngeal vestibule Pharyngeal stripping wave : Present - diminished Pharyngeal contraction (A/P view only): N/A Pharyngoesophageal segment opening: Complete distension and complete duration, no obstruction of flow (patent esophagus, open at rest) Tongue base retraction: Trace column of contrast or air between tongue base and PPW Pharyngeal residue: Collection of residue within or on pharyngeal structures Location of pharyngeal residue: Pyriform sinuses (in patent esophagus after the swallow)  Esophageal Impairment Domain: Esophageal Impairment Domain Esophageal clearance upright position: Esophageal retention (in upper esophagus due to suspected reduced peristalsis) Pill: Pill Consistency administered: Thin liquids (Level 0) Thin liquids (Level 0): [facility] Penetration/Aspiration Scale Score: Penetration/Aspiration Scale Score 1.  Material does not enter airway: Thin liquids (Level 0); Mildly thick liquids (Level 2, nectar thick); Moderately thick liquids (Level 3, honey thick); Puree; Solid; Pill Compensatory Strategies: Compensatory Strategies Compensatory strategies: No   General Information: Caregiver present: No  Diet Prior to this Study: Regular; Thin liquids (Level 0)   Temperature : Normal   Respiratory Status: WFL   Supplemental O2: None (Room air)   History of Recent Intubation: No  Behavior/Cognition: Alert; Cooperative Self-Feeding Abilities: Able to self-feed Baseline vocal quality/speech: Hypophonia/low volume; Dysphonic Volitional Cough: Able to elicit Volitional Swallow: Able to elicit Exam Limitations: No limitations Goal Planning: Prognosis  for improved oropharyngeal function: Fair Barriers to Reach Goals: Cognitive deficits; Overall medical prognosis No data recorded No data recorded Consulted and agree with results and recommendations: Patient Pain: Pain Assessment Pain Assessment: No/denies pain End of Session: Start Time:No data recorded Stop Time: No data recorded Time Calculation:No data recorded Charges: No data recorded SLP visit diagnosis: SLP Visit Diagnosis: Dysphagia, pharyngoesophageal phase (R13.14); Dysphagia, oral phase (R13.11) Past Medical History: Past Medical History: Diagnosis Date  AICD (automatic cardioverter/defibrillator) present   Dr Waddell office visit yearly, MDT  medtronic   Arthritis   cane  Asthma   CAD (coronary artery disease)   Cardiomyopathy   Cataract   removed  Complication of anesthesia   pt states that he got a rash  Constipation   Deaf   right ear, 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)  DM (diabetes mellitus) (HCC)   TYPE 2 - insulin  pump  Dysrhythmia   a-fib  GERD (gastroesophageal reflux disease)   Glaucoma  right eye  History of kidney stones   multiple  HLD (hyperlipidemia)   HTN (hypertension)   pt denies 08/19/12  Hyperplasia, prostate   Hypothyroidism   MI (myocardial infarction) (HCC)   Dr Pietro 2000, x3vessels bypass  Neuromuscular disorder (HCC)   Parkinson's Disease  OSA (obstructive sleep apnea)   AHI-28,on CPAP, noncompliant with CPAP  Parkinson disease (HCC)   1999  PONV (postoperative nausea and vomiting)   Restless legs   Shortness of breath   Hx: of at all times  UTI (lower urinary tract infection) 09/15/2012  Klebsiella  Ventral hernia   Walker as ambulation aid   also uses wheelchair at home/when going out Past Surgical History: Past Surgical History: Procedure Laterality Date  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  BIOPSY  10/14/2018  Procedure: BIOPSY;  Surgeon: Albertus Gordy HERO, MD;  Location: [facility];  Service: Gastroenterology;;  CATARACT EXTRACTION W/ INTRAOCULAR LENS  IMPLANT    Hx: of right eye  CATARACT EXTRACTION W/ INTRAOCULAR LENS IMPLANT Left 2018  COLONOSCOPY N/A 10/13/2014  Procedure: COLONOSCOPY;  Surgeon: Gordy HERO Albertus, MD;  Location: [facility];  Service: Gastroenterology;  Laterality: N/A;  COLONOSCOPY W/ BIOPSIES AND POLYPECTOMY    Hx: of  COLONOSCOPY WITH PROPOFOL  N/A 10/14/2018  Procedure: COLONOSCOPY WITH PROPOFOL ;  Surgeon: Albertus Gordy HERO, MD;  Location: [facility];  Service: Gastroenterology;  Laterality: N/A;  CORONARY ARTERY BYPASS GRAFT  2000  Sudie Laine, MD  CORONARY STENT PLACEMENT  1998  DEEP BRAIN STIMULATOR PLACEMENT  2004  Right and left VIN stimulator placement (parkinsons)  EYE SURGERY    Cataract removed in 1980s related to wood in eye when 73 years old  FINGER AMPUTATION    left pointer  IMPLANTABLE CARDIOVERTER DEFIBRILLATOR IMPLANT N/A 11/13/2013  Procedure: IMPLANTABLE CARDIOVERTER DEFIBRILLATOR IMPLANT;  Surgeon: Danelle LELON Birmingham, MD;  Location: [facility] CATH LAB;  Service: Cardiovascular;  Laterality: N/A;  INSERT / REPLACE / REMOVE PACEMAKER    medtronic  LEFT AND RIGHT HEART CATHETERIZATION WITH CORONARY ANGIOGRAM N/A 09/24/2013  Procedure: LEFT AND RIGHT HEART CATHETERIZATION WITH CORONARY ANGIOGRAM;  Surgeon: Lonni JONETTA Cash, MD;  Location: [facility] CATH LAB;  Service: Cardiovascular;  Laterality: N/A;  LITHOTRIPSY    3 different times  POLYPECTOMY  10/14/2018  Procedure: POLYPECTOMY;  Surgeon: Albertus Gordy HERO, MD;  Location: [facility];  Service: Gastroenterology;;  PULSE GENERATOR IMPLANT Right 11/13/2017  Procedure: Right chest implantable pulse generator change;  Surgeon: Unice Pac, MD;  [facility] OR;  Service: Neurosurgery;  Laterality: Right;  Right chest implantable pulse generator change  SUBTHALAMIC STIMULATOR BATTERY REPLACEMENT N/A 09/05/2012  Procedure: Deep brain stimulator battery change;  Surgeon: Pac Unice, MD;  Location: [facility];  Service: Neurosurgery;  Laterality: N/A;  Deep brain stimulator battery change   SUBTHALAMIC STIMULATOR BATTERY REPLACEMENT N/A 06/10/2015  Procedure: Deep Brain stimulator battery change;  Surgeon: Pac Unice, MD;  Location: [facility];  Service: Neurosurgery;  Laterality: N/A;  SUBTHALAMIC STIMULATOR BATTERY REPLACEMENT Right 09/25/2019  Procedure: Deep brain stimulator battery change;  Surgeon: Unice Pac, MD;  Location: [facility] OR;  Service: Neurosurgery;  Laterality: Right;  SUBTHALAMIC STIMULATOR BATTERY REPLACEMENT Right 12/15/2022  Procedure: IMPLANTABLE PULSE GENERATOR  BATTERY REPLACEMENT;  Surgeon: Carollee Lani BROCKS, DO;  Location: [facility] OR;  Service: Neurosurgery;  Laterality: Right;  3C  TONSILLECTOMY   Rosina LABOR Ellin 12/14/2023, 10:14 AM  CT HEAD WO CONTRAST ( ) Result Date: 12/07/2023 EXAM: CT HEAD WITHOUT CONTRAST 12/07/2023 11:30:30 AM TECHNIQUE:  CT of the head was performed without the administration of intravenous contrast. Automated exposure control, iterative reconstruction, and/or weight based adjustment of the mA/kV was utilized to reduce the radiation dose to as low as reasonably achievable. COMPARISON: CT of the head dated 11/29/2023. CLINICAL HISTORY: Neuro deficit, acute, stroke suspected. FINDINGS: BRAIN AND VENTRICLES: No acute hemorrhage. No evidence of acute infarct. No hydrocephalus. No extra-axial collection. No mass effect or midline shift. Age-related cerebral volume loss present. Deep brain stimulators again demonstrated. ORBITS: No acute abnormality. SINUSES: No acute abnormality. SOFT TISSUES AND SKULL: No acute soft tissue abnormality. No skull fracture. Status post right mastoidectomy. IMPRESSION: 1. No acute intracranial abnormality. 2. Age-related cerebral volume loss. 3. Deep brain stimulators again demonstrated. 4. Status post right mastoidectomy. Electronically signed by: Evalene Coho MD 12/07/2023 11:50 AM EST RP Workstation: [id number]   EEG adult Result Date: 12/07/2023 Henry Lek, MD     12/07/2023  8:48 AM Patient Name: Henry [name] MRN: [id number] Epilepsy Attending: Lek Henry Referring Physician/Provider: No ref. provider found     Date: 12/07/2023 Duration: 28 minutes Patient history: 73 year old man with generalized weakness and altered mental status. EEG to evaluate for seizure. Level of alertness: Lethargic AEDs during EEG study: None Technical aspects: This EEG study was done with scalp electrodes positioned according to the 10-20 International system of electrode placement. Electrical activity was reviewed with band pass filter of 1-70Hz , sensitivity of 7 uV/mm, display speed of 40mm/sec with a 60Hz  notched filter applied as appropriate. EEG data were recorded continuously and digitally stored.  Video monitoring was available and reviewed as appropriate. Description: EEG showed continuous generalized 3 to 6 Hz theta-delta slowing. Hyperventilation and photic stimulation were not performed.   ABNORMALITY - Continuous slow, generalized IMPRESSION: This study is suggestive of moderate diffuse brain dysfunction such as encephalopathy, nonspecific etiology but likely related to sedation, toxic-metabolic etiology. No seizures or epileptiform discharges were seen throughout the recording. [name] Gregg MD Neurology    DG Chest 2 View Result Date: 12/06/2023 EXAM: 2 VIEW(S) XRAY OF THE CHEST 12/06/2023 08:46:34 PM COMPARISON: 12/03/2023 CLINICAL HISTORY: 200808 Hypoxia [id number] FINDINGS: LINES, TUBES AND DEVICES: Left AICD is unchanged. LUNGS AND PLEURA: Mild vascular congestion. Mild elevation of the right hemidiaphragm with right base atelectasis. No focal pulmonary opacity. No pleural effusion. No pneumothorax. HEART AND MEDIASTINUM: Prior CABG. Mild cardiomegaly. BONES AND SOFT TISSUES: No acute osseous abnormality. IMPRESSION: 1. Mild vascular congestion with right basilar atelectasis and mild elevation of the right hemidiaphragm. 2. Mild cardiomegaly. Electronically signed by: Franky Crease MD 12/06/2023 09:00 PM EST RP  Workstation: [id number]    Labs:  Basic Metabolic Panel: Recent Labs  Lab 12/19/23 0649 12/20/23 1018 12/21/23 0548 12/22/23 0553 12/23/23 0652  NA 139 137 141 141 139  K 3.8 3.9 4.5 4.2 3.7  CL 93* 95* 95* 97* 92*  CO2 34* 36* 39* 38* 40*  GLUCOSE 127* 165* 143* 142* 124*  BUN 32* 36* 34* 30* 27*  CREATININE 1.31* 1.34* 1.41* 1.27* 1.18  CALCIUM  9.0 9.0 8.8* 8.8* 8.6*  MG  --  2.1  --   --   --     CBC: Recent Labs  Lab 12/19/23 0649 12/22/23 0553 12/23/23 0652  WBC 10.4 7.1 7.4  HGB 14.2 13.4 13.5  HCT 43.3 42.9 43.0  MCV 89.1 92.3 92.1  PLT 162 152 132*    CBG: Recent Labs  Lab 12/23/23 1117 12/23/23 1629 12/23/23 2027 12/24/23 0604 12/24/23 1209  GLUCAP  193* 194* 199* 130* 168*    Brief HPI:   Henry [name] is a 73 y.o. male with history of Parkinson's disease s/p DBS, autonomic dysfunction, T2DM, chronic systolic and diastolic CHF, ICM, PAF, OSA, recent episodes of staring with poor responsiveness and had DBS adjusted by neurologist.  He was admitted on 11/27/2023 with reports of BLE edema, decrease in LOC and leaning to the right and right with left-sided weakness and difficulty walking.  BLE Dopplers were negative for DVT.  EEG done and was negative for epileptiform discharges or seizures.  CT head was negative for acute changes.  CT A/P ordered due to issues with abdominal pain and showed new consolidation of RML and BLL as well as redundant large bowel with retrained stool.  Cardiology consulted on 11/24 and recommended continuing oral Lasix  as well as resumption of beta-blocker.  BLE edema had resolved but he continued to have issues with fatigue and weakness.  Family also reported issues with hallucination and agitation at night as well as urinary retention on day of admission.  Constipation had improved with use of laxatives.  PT/OT was working with patient who required supervision to mod assist with ADLs and mod to max assist for pre-gait activity.   CIR was recommended due to functional decline.   Hospital Course: Henry [name] was admitted to rehab 12/05/2023 for inpatient therapies to consist of PT, ST and OT at least three hours five days a week. Past admission physiatrist, therapy team and rehab RN have worked together to provide customized collaborative inpatient rehab. His blood pressures were monitored on TID basis and losartan  and Flomax  were discontinued due to issues with dizziness.    Family reported worsening of dysarthria and weakness on 1130 therefore head CT was ordered which was negative for acute changes EEG was repeated due to concerns of seizure and this showed diffuse slowing without seizure activity.  Bowel program has been adjusted to help manage constipation.  Mirabegron  was discontinued and he is noted to be voiding without signs of retention. Amantadine  was tried briefly as patient reported being stiff in mornings but this discontinued due to concerns of confusion. His DBS was turned back on by family on 12/10 with neurology input.  MBS performed on 12/05 to evaluate swallow and recommended regular diet with thin liquids.   Patient was advised on pulmonary hygiene due to low lung volumes.  He developed wheezing and Lasix  was titrated upward to help with diuresis.  He had minimal improvement with this therefore medicine was consulted and has been assisting in medical management.  ABG done revealing compensated respiratory   He was felt to have asthma exacerbation and was started on 5-day course steroids.  Palliative care was also consulted to help discuss goals of care and family did decide on hospice but discharge was held due to patient's transient improvement.  However after couple of days he had recurrent worsening of respiratory status with hypoxia and weakness.  Chest x-ray done showed question of effusions and pulmonary was contacted for input.  Thoracocentesis was recommended however IR was unable to visualize lung cavity  enough for safe access. CPAP was attempted but patient was unable to tolerate this.   His diabetes has been monitored with ac/hs CBG checks and SSI was use prn for tighter BS control.  P.o. intake has fluctuated in part due to mentation.  Blood sugars were reasonably controlled until addition of prednisone  which cause blood sugars to trend high.  Lantus  was increased  to help manage blood sugars. Family was advised to resume omnipod and his home regimen at discharge. Anticipate that BS will continue to improve over the next few days.  Acute on chronic renal failure is improving.   Family has been very supportive and assisting with feeding patient whose intake has been good. Serial CBC showed fluctuating thrombocytopenia and H&H relatively stable. Serial check of BMET showed acute on chronic renal failure has resolved.  His was making progress but has had decline with decreased endurance with bouts of lethargy and hypoxia. Family has elected on discharge to home with hospice support.    Rehab course: During patient's stay in rehab weekly team conferences were held to monitor patient's progress, set goals and discuss barriers to discharge. At admission, patient required total assist +2 for mobility and max assist with ADL tasks. Cognitive exam was limited by somnolence and he exhibited severe profound dysarthria with low vocal intensity and speech was largely unintelligible without contacts.  He also presented with severe short-term memory deficits as well as issues with problem-solving. He has made some gains during his stay but continues to require fluctuating amount of assistance. He is  requires mod to max assist with ADL tasks. He requires mod assist for bed mobility and max assist for supine to sitting.  He is able to perform sit to stand transfers with contact guard assist and requires min assist with tactile cues for stand pivot transfers.  He is able to ambulate 55 feet with U step walker. Speech is 85%  intelligible and he is able to maintain attention for 10 minutes with mod assist.  He was able to perform simple tasks with supervision.  Discharge disposition: 06-Home-Health Care Svc  Diet: Carb Modified.   Special Instructions: Resume Omnipod at home. Monitor BS ac/hs and prn.  Use flutter valve qid for pulmonary hygiene.   Discharge Instructions     Ambulatory referral to Physical Medicine Rehab   Complete by: As directed    Raulker--post hospital follow up      Allergies as of 12/24/2023       Reactions   Penicillins Anaphylaxis, Rash   Because of a history of documented adverse serious drug reaction;Medi Alert bracelet  is recommended PATIENT HAS HAD A PCN REACTION WITH IMMEDIATE RASH, FACIAL/TONGUE/THROAT SWELLING, SOB, OR LIGHTHEADEDNESS WITH HYPOTENSION:  #  #  YES  #  #  Has patient had a PCN reaction causing severe rash involving mucus membranes or skin necrosis: unknown Has patient had a PCN reaction that required hospitalization NO Has patient had a PCN reaction occurring within the last 10 years: NO   Klonopin  [clonazepam ] Other (See Comments)   agitation   Peanut-containing Drug Products Cough   Watermelon [citrullus Vulgaris] Other (See Comments), Cough   Tickle in throat        Medication List     STOP taking these medications    cyanocobalamin  1000 MCG tablet Commonly known as: VITAMIN B12   DULoxetine  20 MG capsule Commonly known as: Cymbalta    enoxaparin  60 MG/0.6ML injection Commonly known as: LOVENOX    insulin  aspart 100 UNIT/ML injection Commonly known as: novoLOG    insulin  glargine-yfgn 100 UNIT/ML injection Commonly known as: SEMGLEE    insulin  lispro 100 UNIT/ML injection Commonly known as: HumaLOG    losartan  25 MG tablet Commonly known as: COZAAR    mirabegron  ER 25 MG Tb24 tablet Commonly known as: MYRBETRIQ    Omnipod 5 DexG7G6 Intro Gen 5 Kit   polyethylene glycol 17 g packet  Commonly known as: MIRALAX  / GLYCOLAX  Replaced  by: polyethylene glycol powder 17 GM/SCOOP powder   potassium chloride  SA 20 MEQ tablet Commonly known as: KLOR-CON  M   tirzepatide  7.5 MG/0.5ML Pen Commonly known as: MOUNJARO    Wixela Inhub 250-50 MCG/ACT Aepb Generic drug: fluticasone -salmeterol       TAKE these medications    Carbidopa -Levodopa  ER 25-100 MG tablet controlled release Commonly known as: SINEMET  CR TAKE 2 TABLETS BY MOUTH 5 TIMES PER DAY AS DIRECTED The timing of this medication is very important.   acetaminophen  325 MG tablet Commonly known as: TYLENOL  Take 2 tablets (650 mg total) by mouth every 6 (six) hours as needed for mild pain (pain score 1-3), fever or headache. What changed: reasons to take this   albuterol  108 (90 Base) MCG/ACT inhaler Commonly known as: VENTOLIN  HFA TAKE 2 PUFFS BY MOUTH EVERY 6 HOURS AS NEEDED FOR WHEEZE OR SHORTNESS OF BREATH   allopurinol  100 MG tablet Commonly known as: ZYLOPRIM  Take 1.5 tablets (150 mg total) by mouth daily.   arformoterol  15 MCG/2ML Nebu Commonly known as: BROVANA  Take 2 mLs (15 mcg total) by nebulization 2 (two) times daily.   aspirin  EC 81 MG tablet Take 1 tablet (81 mg total) by mouth daily. Swallow whole.   budesonide  0.25 MG/2ML nebulizer solution Commonly known as: PULMICORT  Take 2 mLs (0.25 mg total) by nebulization 2 (two) times daily.   Dexcom G6 Receiver Devi Use to check blood sugar daily   Dexcom G6 Sensor Misc USE TO CHECK BLOOD SUGAR DAILY   Dexcom G6 Transmitter Misc Change every 3 months   dorzolamide -timolol  2-0.5 % ophthalmic solution Commonly known as: COSOPT  Place 1 drop into the right eye 2 (two) times daily.   Elfolate Plus 3-35-2 MG Tabs Take 1 tablet by mouth daily.   FREESTYLE LITE test strip Generic drug: glucose blood USE TO CHECK BLOOD SUGAR 4 TIMES DAILY.   furosemide  20 MG tablet Commonly known as: LASIX  Take 2 tablets (40 mg total) by mouth daily.   icosapent  Ethyl 1 g capsule Commonly known as:  Vascepa  Take 2 capsules (2 g total) by mouth 2 (two) times daily.   levothyroxine  150 MCG tablet Commonly known as: SYNTHROID  TAKE 1 TABLET BY MOUTH DAILY BEFORE BREAKFAST.   magnesium  oxide 400 (240 Mg) MG tablet Commonly known as: MAG-OX Take 1 tablet (400 mg total) by mouth daily.   melatonin 5 MG Tabs Take 1 tablet (5 mg total) by mouth at bedtime as needed. What changed:  medication strength how much to take   metoprolol  succinate 25 MG 24 hr tablet Commonly known as: TOPROL -XL Take 1 tablet (25 mg total) by mouth daily. Take with or immediately following a meal.   nitroGLYCERIN  0.4 MG SL tablet Commonly known as: NITROSTAT  Place 1 tablet (0.4 mg total) under the tongue every 5 (five) minutes as needed for chest pain.   Nuplazid  34 MG Caps Generic drug: Pimavanserin  Tartrate TAKE 1 CAPSULE BY MOUTH 1 TIME A DAY   Omnipod 5 DexG7G6 Pods Gen 5 Misc CHANGE POD EVERY 3 DAYS   OVER THE COUNTER MEDICATION Apply 1 application topically daily as needed (pain). Theraworx Pain Cream   polyethylene glycol powder 17 GM/SCOOP powder Commonly known as: GLYCOLAX /MIRALAX  Take 17 g by mouth daily. Dissolve 1 capful (17g) in 4-8 ounces of liquid and take by mouth daily. Replaces: polyethylene glycol 17 g packet   Robafen DM 20-200 MG/20ML Liqd Generic drug: Dextromethorphan -guaiFENesin  Take 10-20 mLs by mouth every  6 (six) hours as needed.   rosuvastatin  20 MG tablet Commonly known as: CRESTOR  TAKE 1 TAB BY MOUTH EVERYDAY AT BEDTIME   sennosides-docusate sodium  8.6-50 MG tablet Commonly known as: SENOKOT-S Take 1 tablet by mouth daily as needed for constipation. Take along with miralax .   spironolactone  25 MG tablet Commonly known as: ALDACTONE  Take 0.5 tablets (12.5 mg total) by mouth daily.   Vitamin D  50 MCG (2000 UT) tablet Take 2,000 Units by mouth every evening.        Follow-up Information     Raulkar, Sven SQUIBB, MD. Call.   Specialty: Physical Medicine and  Rehabilitation Why: As needed Contact information: [address] [place] [address] [phone]         TatAsberry RAMAN, DO Follow up.   Specialty: Neurology Why: Call in 1-2 days for post hospital follow up Contact information: [address] [place]  [address] [place] [address] [phone]         [facility]. Call.   Specialty: Neurosurgery Why: As needed Contact information: [address] [place] [address] [phone]         [facility] Follow up.   Why: Will follow for medical issues Contact information: [address] [place] [address] [phone]                 Signed: Sharlet RAMAN Schmitz 12/24/2023, 6:00 PM

## 2023-12-24 NOTE — Plan of Care (Signed)

## 2023-12-27 NOTE — Progress Notes (Signed)
 Occupational Therapy Discharge Summary  Patient Details  Name: Henry [name] MRN: [id number] Date of Birth: 1950-04-21  Date of Discharge from OT service:December 23, 2023    Patient has met 6 of 8 long term goals due to improved balance and postural control.  Patient to discharge at overall max to total A for ADLs and contact guard for sit to stands / transfers. level.  Patient's care partner is independent to provide the necessary physical  and cognition assistance at discharge.    Reasons goals not met:  Pt/family decided to move up discharge to Monday with palliative v/s hospice care services. Goals not met due to fluctuating mobility due to Parkinson's, fatigue, weakness/deconditioning, and SOB with activity.    Recommendation:  Patient will benefit from ongoing skilled OT services in home health setting to continue to advance functional skills in the area of BADL. And decr burden of care through hospice services  Equipment: Through hospice services  Reasons for discharge: discharge from hospital  Patient/family agrees with progress made and goals achieved: Yes  OT Discharge    ADL ADL Eating: Moderate assistance Where Assessed-Eating: Wheelchair Grooming: Minimal assistance Where Assessed-Grooming: Sitting at sink Upper Body Bathing: Moderate assistance Where Assessed-Upper Body Bathing: Sitting at sink Lower Body Bathing: Dependent Where Assessed-Lower Body Bathing: Sitting at sink Upper Body Dressing: Maximal assistance Where Assessed-Upper Body Dressing: Wheelchair Lower Body Dressing: Dependent Where Assessed-Lower Body Dressing: Wheelchair Toileting: Dependent Where Assessed-Toileting: Bed level Toilet Transfer: Dependent Toilet Transfer Method: Stand pivot Tub/Shower Transfer: Not assessed Film/video Editor: Not assessed ADL Comments: see care tool overall score Vision Baseline Vision/History: 1 Wears glasses Patient Visual Report: No change from  baseline Vision Assessment?: No apparent visual deficits Perception  Perception: Not tested Praxis Praxis: Impaired Praxis Impairment Details: Motor planning;Organization Cognition Cognition Overall Cognitive Status: Impaired/Different from baseline Arousal/Alertness: Awake/alert Memory: Impaired Memory Impairment: Retrieval deficit;Decreased recall of new information;Decreased short term memory Decreased Short Term Memory: Verbal basic;Functional basic Attention: Sustained Sustained Attention: Impaired Sustained Attention Impairment: Verbal basic;Functional basic Awareness: Impaired Awareness Impairment: Emergent impairment Problem Solving: Impaired Safety/Judgment: Impaired Sensation Sensation Light Touch: Impaired Detail Light Touch Impaired Details: Impaired RLE Proprioception: Impaired by gross assessment Additional Comments: pt reports numbness in R foot Coordination Gross Motor Movements are Fluid and Coordinated: No Fine Motor Movements are Fluid and Coordinated: No Coordination and Movement Description: fluctuates - impaired awareness, initiation, and motor planning/sequencing due to Parkinson's Finger Nose Finger Test: decreased coordination Motor  Motor Motor - Skilled Clinical Observations: fluctuates - impaired awareness, initiation, and motor planning/sequencing due to Parkinson's Motor - Discharge Observations: see above Mobility  Bed Mobility Bed Mobility: Rolling Right;Rolling Left;Sit to Supine;Supine to Sit Rolling Right: Moderate Assistance - Patient 50-74% Rolling Left: Moderate Assistance - Patient 50-74% Supine to Sit: Moderate Assistance - Patient 50-74% Sit to Supine: Maximal Assistance - Patient 25-49% Transfers Sit to Stand: Contact Guard/Touching assist Stand to Sit: Contact Guard/Touching assist  Trunk/Postural Assessment  Cervical Assessment Cervical Assessment:  (forward head) Thoracic Assessment Thoracic Assessment:  (rounded  shoulders) Lumbar Assessment Lumbar Assessment:  (posterior pelvic tilt) Postural Control Trunk Control: posterior bias in sitting and standing Righting Reactions: delayed and inadequate Protective Responses: delayed and inadequate  Balance Static Sitting Balance Static Sitting - Balance Support: Feet supported;Bilateral upper extremity supported Static Sitting - Level of Assistance: 5: Stand by assistance Dynamic Sitting Balance Dynamic Sitting - Balance Support: Feet supported;No upper extremity supported Dynamic Sitting - Level of Assistance: 5: Stand by assistance Static  Standing Balance Static Standing - Balance Support: Bilateral upper extremity supported;During functional activity Static Standing - Level of Assistance: 5: Stand by assistance Dynamic Standing Balance Dynamic Standing - Balance Support: Bilateral upper extremity supported;During functional activity Dynamic Standing - Level of Assistance: 5: Stand by assistance Extremity/Trunk Assessment RUE Assessment Active Range of Motion (AROM) Comments: slight increased tone and rigidity. ROM greatly effected by parkinsons General Strength Comments: 3/5 strength overall. LUE Assessment Active Range of Motion (AROM) Comments: slight increased tone and rigidity. ROM greatly effected by parkinsons General Strength Comments: 3/5 overall   Claudene Nest [facility] 12/27/2023, 8:45 AM

## 2023-12-27 NOTE — Plan of Care (Signed)
°  Problem: RH Dressing Goal: LTG Patient will perform upper body dressing (OT) Description: LTG Patient will perform upper body dressing with assist, with/without cues (OT). Outcome: Adequate for Discharge   Problem: Sit to Stand Goal: LTG:  Patient will perform sit to stand in prep for activites of daily living with assistance level (OT) Description: LTG:  Patient will perform sit to stand in prep for activites of daily living with assistance level (OT) Outcome: Completed/Met

## 2024-01-01 ENCOUNTER — Encounter: Admitting: Physical Medicine & Rehabilitation

## 2024-01-05 ENCOUNTER — Other Ambulatory Visit: Payer: Self-pay | Admitting: Endocrinology

## 2024-01-05 DIAGNOSIS — E782 Mixed hyperlipidemia: Secondary | ICD-10-CM

## 2024-01-08 ENCOUNTER — Encounter: Payer: Self-pay | Admitting: Neurology

## 2024-01-10 DEATH — deceased

## 2024-01-13 ENCOUNTER — Telehealth: Payer: Self-pay | Admitting: Family Medicine

## 2024-01-13 NOTE — Telephone Encounter (Signed)
 Called and gave my condolences to his widow and she thanked me for the call.  I was always glad to see this gentleman in clinic and he will be missed.

## 2024-01-16 ENCOUNTER — Encounter

## 2024-02-04 ENCOUNTER — Encounter: Admitting: Physical Medicine and Rehabilitation

## 2024-02-14 ENCOUNTER — Ambulatory Visit: Admitting: Endocrinology

## 2024-02-18 ENCOUNTER — Ambulatory Visit: Admitting: Cardiology

## 2024-03-04 ENCOUNTER — Ambulatory Visit: Admitting: Occupational Therapy

## 2024-03-04 ENCOUNTER — Ambulatory Visit: Admitting: Physical Therapy

## 2024-04-08 ENCOUNTER — Ambulatory Visit

## 2024-04-16 ENCOUNTER — Encounter

## 2024-07-16 ENCOUNTER — Encounter

## 2024-10-15 ENCOUNTER — Encounter
